# Patient Record
Sex: Male | Born: 1950
Health system: Southern US, Community
[De-identification: ages and names within clinical notes are randomized; demographics above are authoritative.]

## PROBLEM LIST (undated history)

## (undated) ENCOUNTER — Emergency Department (HOSPITAL_COMMUNITY): Payer: Medicare HMO

## (undated) DIAGNOSIS — Z8719 Personal history of other diseases of the digestive system: Secondary | ICD-10-CM

## (undated) DIAGNOSIS — N529 Male erectile dysfunction, unspecified: Secondary | ICD-10-CM

## (undated) DIAGNOSIS — J3801 Paralysis of vocal cords and larynx, unilateral: Secondary | ICD-10-CM

## (undated) DIAGNOSIS — J9311 Primary spontaneous pneumothorax: Secondary | ICD-10-CM

## (undated) DIAGNOSIS — N400 Enlarged prostate without lower urinary tract symptoms: Secondary | ICD-10-CM

## (undated) DIAGNOSIS — U071 COVID-19: Secondary | ICD-10-CM

## (undated) DIAGNOSIS — D631 Anemia in chronic kidney disease: Secondary | ICD-10-CM

## (undated) DIAGNOSIS — R12 Heartburn: Secondary | ICD-10-CM

## (undated) DIAGNOSIS — K219 Gastro-esophageal reflux disease without esophagitis: Secondary | ICD-10-CM

## (undated) DIAGNOSIS — K449 Diaphragmatic hernia without obstruction or gangrene: Secondary | ICD-10-CM

## (undated) DIAGNOSIS — N189 Chronic kidney disease, unspecified: Secondary | ICD-10-CM

## (undated) DIAGNOSIS — M21372 Foot drop, left foot: Secondary | ICD-10-CM

## (undated) HISTORY — DX: Heartburn: R12

## (undated) HISTORY — DX: Primary spontaneous pneumothorax: J93.11

## (undated) HISTORY — PX: OTHER SURGICAL HISTORY: SHX169

## (undated) HISTORY — PX: TONSILLECTOMY: SUR1361

---
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
## 1978-10-23 HISTORY — PX: KNEE SURGERY: SHX244

## 1979-06-24 HISTORY — PX: KNEE SURGERY: SHX244

## 2007-10-24 HISTORY — PX: COLONOSCOPY: SHX174

## 2008-02-03 ENCOUNTER — Ambulatory Visit: Payer: Self-pay | Admitting: Gastroenterology

## 2008-02-03 ENCOUNTER — Ambulatory Visit (HOSPITAL_COMMUNITY): Admission: RE | Admit: 2008-02-03 | Discharge: 2008-02-03 | Payer: Self-pay | Admitting: Gastroenterology

## 2010-10-23 HISTORY — PX: ACHILLES TENDON SURGERY: SHX542

## 2011-02-21 ENCOUNTER — Ambulatory Visit (INDEPENDENT_AMBULATORY_CARE_PROVIDER_SITE_OTHER): Payer: BC Managed Care – PPO | Admitting: Orthopedic Surgery

## 2011-02-21 ENCOUNTER — Encounter: Payer: Self-pay | Admitting: Orthopedic Surgery

## 2011-02-21 ENCOUNTER — Ambulatory Visit: Payer: Self-pay | Admitting: Orthopedic Surgery

## 2011-02-21 VITALS — HR 76 | Ht 70.5 in | Wt 208.0 lb

## 2011-02-21 DIAGNOSIS — S93499A Sprain of other ligament of unspecified ankle, initial encounter: Secondary | ICD-10-CM

## 2011-02-21 DIAGNOSIS — S86011A Strain of right Achilles tendon, initial encounter: Secondary | ICD-10-CM

## 2011-02-21 MED ORDER — HYDROCODONE-ACETAMINOPHEN 7.5-325 MG PO TABS: 1.0000 | ORAL_TABLET | Freq: Four times a day (QID) | ORAL | Status: AC | PRN

## 2011-02-21 NOTE — Patient Instructions (Signed)
DOS 02/24/11  Preop is tomorrow 02/22/11 at [facility] short stay center at 1:00pm, take packet with you  Do not take any aspirin or other blood thinners until after surgery.  Post op 1 in our office on 02/27/11

## 2011-02-21 NOTE — Progress Notes (Signed)
Her RIGHT ankle pain.  60 years old. Complains of pain behind his RIGHT ankle after stepping feeling a pop feeling a pop and feel a defect in his RIGHT Achilles. Complains of sharp 89 pain, which has been constant for him associated with bruising and swelling.  We discussed her treatment options of cast versus surgery. Informed consent is obtained for surgical treatment.  All systems were reviewed were negative, except for some mild heartburn.  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 Doesn't smoke.  General: The patient is normally developed, with normal grooming and hygiene. There are no gross deformities. The body habitus is normal   CDV: The pulse and perfusion of the extremities are normal   LYMPH: There is no gross lymphadenopathy in the extremities   Skin: There are no rashes, ulcers or cafe-au-lait spot   Psyche: The patient is alert, awake and oriented.  Mood is normal   Neuro:  The coordination and balance are normal.  Sensation is normal. Reflexes are 2+ and equal   Musculoskeletal   Upper extremity exam  Inspection and palpation revealed no abnormalities in the upper extremities.  Range of motion is full without contracture.  Motor exam is normal with grade 5 strength.  The joints are fully reduced without subluxation.  There is no atrophy or tremor and muscle tone is normal.  All joints are stable.   He has a positive Thompson test for Achilles rupture of the RIGHT ankle. Negative on the LEFT is a palpable defect with tenderness. The ankle is swollen. Passive range of motion of the ankle normal. Ankle strength, decreased plantar flexion, with weakness. Stability test were normal.  RIGHT Achilles tendon rupture.  Plan RIGHT Achilles tendon repair. Splint after surgery. Then convert to ankle range of motion walking boot.

## 2011-02-22 ENCOUNTER — Other Ambulatory Visit (HOSPITAL_COMMUNITY): Payer: BC Managed Care – PPO

## 2011-02-23 ENCOUNTER — Telehealth: Payer: Self-pay | Admitting: Orthopedic Surgery

## 2011-02-23 ENCOUNTER — Other Ambulatory Visit: Payer: Self-pay | Admitting: Orthopedic Surgery

## 2011-02-23 ENCOUNTER — Encounter (HOSPITAL_COMMUNITY): Payer: BC Managed Care – PPO

## 2011-02-23 LAB — SURGICAL PCR SCREEN
MRSA, PCR: NEGATIVE
Staphylococcus aureus: NEGATIVE

## 2011-02-23 LAB — HEMOGLOBIN AND HEMATOCRIT, BLOOD
HCT: 42.6 % (ref 39.0–52.0)
Hemoglobin: 14.1 g/dL (ref 13.0–17.0)

## 2011-02-23 NOTE — Telephone Encounter (Signed)
Call to [place] ph# [phone] re: out-patient surgery scheduled 02/24/11 at [facility]; CPT [phone].  Per Selinda Orion D, no prior authorization required.

## 2011-02-24 ENCOUNTER — Ambulatory Visit (HOSPITAL_COMMUNITY)
Admission: RE | Admit: 2011-02-24 | Discharge: 2011-02-24 | Disposition: A | Discharge status: Home / Self Care | Payer: BC Managed Care – PPO | Source: Ambulatory Visit | Attending: Orthopedic Surgery | Admitting: Orthopedic Surgery

## 2011-02-24 DIAGNOSIS — S96819A Strain of other specified muscles and tendons at ankle and foot level, unspecified foot, initial encounter: Secondary | ICD-10-CM

## 2011-02-24 DIAGNOSIS — S93499A Sprain of other ligament of unspecified ankle, initial encounter: Secondary | ICD-10-CM

## 2011-02-24 DIAGNOSIS — X500XXA Overexertion from strenuous movement or load, initial encounter: Secondary | ICD-10-CM | POA: Insufficient documentation

## 2011-02-24 HISTORY — PX: ACHILLES TENDON SURGERY: SHX542

## 2011-02-27 ENCOUNTER — Ambulatory Visit (INDEPENDENT_AMBULATORY_CARE_PROVIDER_SITE_OTHER): Payer: BC Managed Care – PPO | Admitting: Orthopedic Surgery

## 2011-02-27 ENCOUNTER — Ambulatory Visit: Payer: BC Managed Care – PPO | Admitting: Orthopedic Surgery

## 2011-02-27 DIAGNOSIS — S86019A Strain of unspecified Achilles tendon, initial encounter: Secondary | ICD-10-CM

## 2011-02-27 DIAGNOSIS — S96819A Strain of other specified muscles and tendons at ankle and foot level, unspecified foot, initial encounter: Secondary | ICD-10-CM

## 2011-02-27 DIAGNOSIS — S93499A Sprain of other ligament of unspecified ankle, initial encounter: Secondary | ICD-10-CM

## 2011-02-27 NOTE — Progress Notes (Signed)
On day 3 status post open RIGHT Achilles tendon repair.  Date of surgery May 4th.  Incision is clean, dry, and intact, with no drainage.  There is some ankle swelling as expected.  Neurovascular exam of the limb is normal.  A new dressing and posterior splint were applied with the foot in neutral position.  The patient will come back for staple removal in about 8 days and we will apply a range of motion brace and locked at 20, plantar flexion within neutral dorsiflexion stop

## 2011-03-03 NOTE — Op Note (Signed)
  [name], [name]              ACCOUNT NO.:  [id number]  MEDICAL RECORD NO.:  [id number]           PATIENT TYPE:  LOCATION:                                 FACILITY:  PHYSICIAN:  Carole Civil, M.D.DATE OF BIRTH:  11-Jun-1951  DATE OF PROCEDURE: DATE OF DISCHARGE:                              OPERATIVE REPORT   CHIEF COMPLAINT:  Right ankle pain.  HISTORY:  A 60 year old male complains of pain behind his right ankle after stepping and feeling a pop.  After that, he felt a defect in the right Achilles.  He complains of sharp 8/10 pain which has become constant associated with bruising and swelling.  We discussed his treatment options of cast versus surgery.  Informed consent is obtained for surgical treatment.  All systems were reviewed and were negative except for mild heartburn.  SOCIAL HISTORY:  He is married.  He has a bachelor's degree.  He is a Hotel manager.  He does not smoke.  PHYSICAL EXAMINATION:  GENERAL:  The patient is normally developed with normal grooming and hygiene. VITAL SIGNS:  Stable vital signs.  No gross deformities.  Body habitus normal. CARDIOVASCULAR:  The pulse and perfusion of the extremities are normal. LYMPH NODES:  No gross lymphadenopathy in the extremities. SKIN:  No rashes, no ulcers, no cafe au lait spots. PSYCHIATRIC:  He is awake, alert, and oriented.  His mood is normal. NEUROLOGIC:  The coordination and balance are normal.  The sensation is normal.  The reflexes are 2+ and equal. MUSCULOSKELETAL:  Upper extremity exam, inspection and palpation revealed no abnormalities in the upper extremities.  Full range of motion was recorded without contracture.  He has grade 5 motor strength. All joints are reduced without subluxation and there is no atrophy, tremor, and the muscle tone is normal.  Ankle exam; right ankle positive Thompson test for Achilles rupture, negative on the left.  There is a palpable defect with tenderness in the  right ankle at the Achilles tendon.  The ankle is swollen.  Passive range of motion is normal. Ankle strength shows decreased plantar flexion with weakness on the right, normal on the left.  Both ankles were stable.  DIAGNOSIS:  Right Achilles tendon rupture.  PLAN:  Right Achilles tendon repair.  I plan to put a split on after surgery and then when the wound is stable, convert to an ankle range of motion, nonweightbearing, walking boot.     Carole Civil, M.D.     SEH/MEDQ  D:  02/24/2011  T:  02/24/2011  Job:  [id number]  Electronically Signed by Arther Abbott M.D. on 03/03/2011 10:57:01 AM

## 2011-03-03 NOTE — Op Note (Signed)
  [name], [name]              ACCOUNT NO.:  [id number]  MEDICAL RECORD NO.:  [id number]           PATIENT TYPE:  O  LOCATION:  DAYP                          FACILITY:  [facility]  PHYSICIAN:  Carole Civil, M.D.DATE OF BIRTH:  [birth year]  DATE OF PROCEDURE:  02/24/2011 DATE OF DISCHARGE:                              OPERATIVE REPORT   PREOPERATIVE DIAGNOSIS:  Torn right Achilles tendon.  POSTOPERATIVE DIAGNOSIS:  Torn right Achilles tendon.  PROCEDURE:  Open right Achilles tendon repair.  SURGEON:  Carole Civil, MD  ASSISTANT:  Pearson Forster.  ANESTHESIA:  General.  FINDINGS:  Complete rupture of the Achilles tendon of the right ankle.  SPECIMENS:  There were no specimens.  BLOOD LOSS:  There was minimal blood loss.  COMPLICATIONS:  There were no complications.  PROCEDURE:  The patient was identified as [name] in the preop area.  He marked his right ankle as the surgical site.  I countersigned it and then chart update confirmed the surgical site.  He was taken to the operating room where he had general anesthesia.  He was placed in the prone position with appropriate padding.  His right leg was then prepped and draped in a sterile fashion.  We then took and completed the time-out procedure.  The limb was exsanguinated with a 4-inch Esmarch.  The tourniquet was elevated at 300 mmHg.  A straight incision was made just to the medial side of the midline of the Achilles.  This was taken down through the subcutaneous tissue.  The rupture was encountered, freshened and then two #5 Tycron sutures were placed in each side of the tendon in a baseball stitch fashion and then the two stitches were tied with the foot in plantarflexion.  We then took #0 Vicryl and completed a peritendinous running suture.  The Boutte test was then repeated and the continuity of the tendon was found to be satisfactory.  The foot was dorsiflexed to neutral position with  integrity maintained at the suture line.  The wound was irrigated and the incision was closed with interrupted 0 Monocryl sutures followed by staples following injection of 30 mL of Marcaine with epinephrine.  Staples were used to reapproximate the skin edges and the patient was placed in a neutral posterior splint with the tourniquet deflated.  The patient was then placed in the supine position on his bed and taken to recovery room.  The plan is for him to come back to the office on Monday to get a splint change and to order his range of motion brace at that time.  He is nonweightbearing at this time.     Carole Civil, M.D.     SEH/MEDQ  D:  02/24/2011  T:  02/25/2011  Job:  [id number]  Electronically Signed by Arther Abbott M.D. on 03/03/2011 10:57:06 AM

## 2011-03-07 ENCOUNTER — Ambulatory Visit (INDEPENDENT_AMBULATORY_CARE_PROVIDER_SITE_OTHER): Payer: BC Managed Care – PPO | Admitting: Orthopedic Surgery

## 2011-03-07 DIAGNOSIS — S86019A Strain of unspecified Achilles tendon, initial encounter: Secondary | ICD-10-CM

## 2011-03-07 DIAGNOSIS — S93499A Sprain of other ligament of unspecified ankle, initial encounter: Secondary | ICD-10-CM

## 2011-03-07 DIAGNOSIS — S96819A Strain of other specified muscles and tendons at ankle and foot level, unspecified foot, initial encounter: Secondary | ICD-10-CM

## 2011-03-07 NOTE — Progress Notes (Signed)
Post op day 11   Achilles repair  Doing well   Incision is clean   Placed in rom brace   NWB  4 weeks

## 2011-03-07 NOTE — Op Note (Signed)
[name], [name]              ACCOUNT NO.:  [id number]   MEDICAL RECORD NO.:  [id number]          PATIENT TYPE:  AMB   LOCATION:  DAY                           FACILITY:  [facility]   PHYSICIAN:  Caro Hight, M.D.      DATE OF BIRTH:  Dec 22, 1950   DATE OF PROCEDURE:  02/03/2008  DATE OF DISCHARGE:                               OPERATIVE REPORT   PROCEDURE:  Colonoscopy.   INDICATION FOR EXAM:  [name] is a 60 year old male who presents for  average risk colon cancer screening.   FINDINGS:  Frequent sigmoid diverticula.  Small internal hemorrhoids.  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   RECOMMENDATIONS:  1. Screening colonoscopy in 10 years.  2. He should follow a high-fiber diet.  He is given a handout on high-      fiber diet, diverticulosis, and hemorrhoids.   MEDICATIONS:  1. Demerol 25 mg IV.  2. Versed 4 mg IV.   PROCEDURE TECHNIQUE:  Physical exam was performed and informed consent  was obtained from the patient, after explaining the benefits, risks, and  alternatives to the procedure.  The patient was connected to the monitor  and placed in the left lateral position.  Continuous oxygen was provided  by nasal cannula and IV medicine administered with an indwelling  cannula.  After administration of sedation and rectal exam, the  patient's rectum was intubated and scope was advanced under direct  visualization to the cecum.  The scope was removed slowly by carefully  examining color, texture, anatomy, and integrity of the mucosa on the  way out.  The patient was recovered in endoscopy and discharged home in  satisfactory condition.      Caro Hight, M.D.  Electronically Signed     SM/MEDQ  D:  02/03/2008  T:  02/04/2008  Job:  [id number]

## 2011-04-05 ENCOUNTER — Ambulatory Visit: Payer: BC Managed Care – PPO | Admitting: Orthopedic Surgery

## 2011-04-05 ENCOUNTER — Ambulatory Visit (INDEPENDENT_AMBULATORY_CARE_PROVIDER_SITE_OTHER): Payer: BC Managed Care – PPO | Admitting: Orthopedic Surgery

## 2011-04-05 DIAGNOSIS — Z9889 Other specified postprocedural states: Secondary | ICD-10-CM

## 2011-04-05 DIAGNOSIS — S93499A Sprain of other ligament of unspecified ankle, initial encounter: Secondary | ICD-10-CM

## 2011-04-05 DIAGNOSIS — S96819A Strain of other specified muscles and tendons at ankle and foot level, unspecified foot, initial encounter: Secondary | ICD-10-CM

## 2011-04-05 DIAGNOSIS — S86019A Strain of unspecified Achilles tendon, initial encounter: Secondary | ICD-10-CM | POA: Insufficient documentation

## 2011-04-05 NOTE — Patient Instructions (Signed)
Start Weight bearing in brace   Start ankle exercises 25 flexion/extension exercises 4 x a day

## 2011-04-05 NOTE — Progress Notes (Signed)
5.4.2012 DOS  Post op day 40  Achilles repair  Treatment ROM brace NWB    His incision looks very good, he now has a normal thompson test   Start WBAT in brace wean crutches

## 2011-04-17 ENCOUNTER — Telehealth: Payer: Self-pay | Admitting: Orthopedic Surgery

## 2011-04-17 NOTE — Telephone Encounter (Signed)
[name] asked for a note to return to work 04/24/11  With restrictions of driving and sitting only.  Please advise if ok. [id number] or cell [phone]

## 2011-04-17 NOTE — Telephone Encounter (Signed)
This is okay.

## 2011-04-18 ENCOUNTER — Encounter: Payer: Self-pay | Admitting: Orthopedic Surgery

## 2011-04-18 NOTE — Telephone Encounter (Signed)
Advised the patient to pick up note

## 2011-04-19 ENCOUNTER — Telehealth: Payer: Self-pay | Admitting: Orthopedic Surgery

## 2011-04-19 NOTE — Telephone Encounter (Signed)
Sarang says his work place will not let him come back to work unless it is full duty no restrictions.  Asking for a note to return 04/24/11 no restrictions. Please advise. [id number] or cell  [phone]

## 2011-04-20 ENCOUNTER — Encounter: Payer: Self-pay | Admitting: Orthopedic Surgery

## 2011-04-20 NOTE — Telephone Encounter (Signed)
Approved.  

## 2011-04-20 NOTE — Telephone Encounter (Signed)
Note written, called patient to pick up

## 2011-05-09 ENCOUNTER — Ambulatory Visit (INDEPENDENT_AMBULATORY_CARE_PROVIDER_SITE_OTHER): Payer: BC Managed Care – PPO | Admitting: Orthopedic Surgery

## 2011-05-09 ENCOUNTER — Ambulatory Visit: Payer: BC Managed Care – PPO | Admitting: Orthopedic Surgery

## 2011-05-09 DIAGNOSIS — S86019A Strain of unspecified Achilles tendon, initial encounter: Secondary | ICD-10-CM

## 2011-05-09 DIAGNOSIS — Z9889 Other specified postprocedural states: Secondary | ICD-10-CM

## 2011-05-09 DIAGNOSIS — S93499A Sprain of other ligament of unspecified ankle, initial encounter: Secondary | ICD-10-CM

## 2011-05-09 DIAGNOSIS — S96819A Strain of other specified muscles and tendons at ankle and foot level, unspecified foot, initial encounter: Secondary | ICD-10-CM

## 2011-05-09 NOTE — Progress Notes (Signed)
Postoperative visit  Procedure RIGHT Achilles open repair on May 4  Doing well  Return to work July 2  Mild swelling around the ankle  He's been able to play surrounds of golf without difficulty  He has good plantarflexion strength.  Palpable tendon integrity.  Normal Thompson test.  Advised to continue return to normal activities gradually no running or jumping until 2013

## 2011-05-09 NOTE — Patient Instructions (Signed)
Gradual return to normal activity   Ok to golf

## 2011-05-31 ENCOUNTER — Telehealth: Payer: Self-pay | Admitting: Orthopedic Surgery

## 2011-05-31 NOTE — Telephone Encounter (Signed)
Patient called to request an achilles brace, states mainly for work due to walking on concrete.  States he has found one that [place] carries.  Copy to follow in fax for Dr. Aline Brochure to review.    If Dr. Aline Brochure approves, patient requests a written prescription for the brace.

## 2011-07-18 ENCOUNTER — Telehealth: Payer: Self-pay | Admitting: Orthopedic Surgery

## 2011-07-18 NOTE — Telephone Encounter (Signed)
Patient called, initially asked about a temporary handicapped sticker. He had achilles surgery 02/24/11. He went on to say that his right achilles has been hurting some, said thought normal to still be hurting.  I scheduled for appointment (offered early October dates, patient elected to wait for first available 8:30am; scheduled 08/08/11

## 2011-08-08 ENCOUNTER — Ambulatory Visit: Payer: Self-pay | Admitting: Orthopedic Surgery

## 2013-10-31 ENCOUNTER — Encounter: Payer: Self-pay | Admitting: Family Medicine

## 2013-10-31 ENCOUNTER — Ambulatory Visit (INDEPENDENT_AMBULATORY_CARE_PROVIDER_SITE_OTHER): Payer: BC Managed Care – PPO | Admitting: Family Medicine

## 2013-10-31 VITALS — BP 132/80 | Ht 70.5 in | Wt 211.5 lb

## 2013-10-31 DIAGNOSIS — K921 Melena: Secondary | ICD-10-CM

## 2013-10-31 DIAGNOSIS — Z0000 Encounter for general adult medical examination without abnormal findings: Secondary | ICD-10-CM

## 2013-10-31 DIAGNOSIS — N400 Enlarged prostate without lower urinary tract symptoms: Secondary | ICD-10-CM

## 2013-10-31 DIAGNOSIS — Z125 Encounter for screening for malignant neoplasm of prostate: Secondary | ICD-10-CM

## 2013-10-31 DIAGNOSIS — Z79899 Other long term (current) drug therapy: Secondary | ICD-10-CM

## 2013-10-31 NOTE — Progress Notes (Signed)
   Subjective:    Patient ID: [name], male    DOB: 09-15-1951, 63 y.o.   MRN: [id number]  HPI Patient is here today for his annual wellness exam. He states he has no concerns at this time. Doing very well.  The patient comes in today for a wellness visit.  A review of their health history was completed.  A review of medications was also completed. Any necessary refills were discussed. Sensible healthy diet was discussed. Importance of minimizing excessive salt and carbohydrates was also discussed. Safety was stressed including driving, activities at work and at home where applicable. Importance of regular physical activity for overall health was discussed. Preventative measures appropriate for age were discussed. Time was spent with the patient discussing any concerns they have about their well-being.   Review of Systems  Constitutional: Negative for fever, activity change and appetite change.  HENT: Negative for congestion and rhinorrhea.   Eyes: Negative for discharge.  Respiratory: Negative for cough and wheezing.   Cardiovascular: Negative for chest pain.  Gastrointestinal: Negative for vomiting, abdominal pain and blood in stool.  Genitourinary: Negative for frequency, hematuria and difficulty urinating.  Musculoskeletal: Negative for neck pain.  Skin: Negative for rash.  Allergic/Immunologic: Negative for environmental allergies and food allergies.  Neurological: Negative for weakness and headaches.  Psychiatric/Behavioral: Negative for agitation.       Objective:   Physical Exam  Constitutional: He appears well-developed and well-nourished.  HENT:  Head: Normocephalic and atraumatic.  Right Ear: External ear normal.  Left Ear: External ear normal.  Nose: Nose normal.  Mouth/Throat: Oropharynx is clear and moist.  Eyes: EOM are normal. Pupils are equal, round, and reactive to light.  Neck: Normal range of motion. Neck supple. No thyromegaly present.    Cardiovascular: Normal rate, regular rhythm and normal heart sounds.   No murmur heard. Pulmonary/Chest: Effort normal and breath sounds normal. No respiratory distress. He has no wheezes.  Abdominal: Soft. Bowel sounds are normal. He exhibits no distension and no mass. There is no tenderness.  Genitourinary: Penis normal.  Musculoskeletal: Normal range of motion. He exhibits no edema.  Lymphadenopathy:    He has no cervical adenopathy.  Neurological: He is alert. He exhibits normal muscle tone.  Skin: Skin is warm and dry. No erythema.  Psychiatric: He has a normal mood and affect. His behavior is normal. Judgment normal.          Assessment & Plan:  #1 he occasionally gets a rash on his left hand when he plays golf he wears gloves all day I recommended he takes the glove off when he is putting on the greens to let air out.  #2 wellness-safety measures dietary measures all discussed in detail. Check lab work. Follow closely. If all lab work looked good followup one year and  #3 BPH-find no hard nodules on prostate exam I would recommend that he followup with [facility] troubles checking PSA  #4 trace Hemoccult positive on rectal exam recommend colonoscopy it has been 5 years since his previous. We will set up a referral.

## 2013-11-03 ENCOUNTER — Encounter: Payer: Self-pay | Admitting: Gastroenterology

## 2013-11-28 ENCOUNTER — Ambulatory Visit: Payer: Self-pay | Admitting: Gastroenterology

## 2013-12-10 ENCOUNTER — Encounter: Payer: Self-pay | Admitting: *Deleted

## 2014-01-05 ENCOUNTER — Ambulatory Visit (INDEPENDENT_AMBULATORY_CARE_PROVIDER_SITE_OTHER): Payer: BC Managed Care – PPO | Admitting: Family Medicine

## 2014-01-05 ENCOUNTER — Encounter (HOSPITAL_COMMUNITY): Payer: Self-pay

## 2014-01-05 ENCOUNTER — Encounter: Payer: Self-pay | Admitting: Family Medicine

## 2014-01-05 ENCOUNTER — Ambulatory Visit (HOSPITAL_COMMUNITY)
Admission: RE | Admit: 2014-01-05 | Discharge: 2014-01-05 | Disposition: A | Discharge status: Home / Self Care | Payer: BC Managed Care – PPO | Source: Ambulatory Visit | Attending: Family Medicine | Admitting: Family Medicine

## 2014-01-05 VITALS — BP 140/90 | Ht 70.5 in | Wt 211.1 lb

## 2014-01-05 DIAGNOSIS — W1809XA Striking against other object with subsequent fall, initial encounter: Secondary | ICD-10-CM | POA: Insufficient documentation

## 2014-01-05 DIAGNOSIS — S060X9A Concussion with loss of consciousness of unspecified duration, initial encounter: Secondary | ICD-10-CM

## 2014-01-05 DIAGNOSIS — S1093XA Contusion of unspecified part of neck, initial encounter: Principal | ICD-10-CM

## 2014-01-05 DIAGNOSIS — S0083XA Contusion of other part of head, initial encounter: Principal | ICD-10-CM

## 2014-01-05 DIAGNOSIS — R55 Syncope and collapse: Secondary | ICD-10-CM | POA: Insufficient documentation

## 2014-01-05 DIAGNOSIS — S0003XA Contusion of scalp, initial encounter: Secondary | ICD-10-CM | POA: Insufficient documentation

## 2014-01-05 DIAGNOSIS — R937 Abnormal findings on diagnostic imaging of other parts of musculoskeletal system: Secondary | ICD-10-CM | POA: Insufficient documentation

## 2014-01-05 MED ORDER — ESOMEPRAZOLE MAGNESIUM 40 MG PO CPDR: 40.0000 mg | DELAYED_RELEASE_CAPSULE | Freq: Every day | ORAL | Status: DC

## 2014-01-05 NOTE — Progress Notes (Signed)
   Subjective:    Patient ID: [name], male    DOB: 20-Jun-1951, 63 y.o.   MRN: [id number]  Loss of Consciousness This is a new problem. The current episode started 1 to 4 weeks ago. The problem occurs intermittently. The problem has been unchanged. He lost consciousness for a period of less than 1 minute. Nothing aggravates the symptoms. Associated symptoms include light-headedness. Pertinent negatives include no abdominal pain, chest pain or fever. He has tried nothing for the symptoms. The treatment provided no relief.  2 weeks ago fell on ice, hit head LOC and laceration Pt disoriented for at least 10 minutes Pt c/o left side head pain off and on, since the fall on ice. Did not have this problem before.  This weekend,fell backwards hit fense and witnessed passing out for 15 seconds on the ground, pt doesn't recall at all. The patient states that he was laughing and his friend he started stumbling then felt dizzy then went against the fence and then down on the ground he was out for just a few seconds as best she knows by the time his friends got out of car she was sitting up in saying that he was feeling much better he went ahead and went home sat for a few minutes then went about his day. Since then she's been having some intermittent dizziness  Patient has no other concerns to discuss at this time.    Review of Systems  Constitutional: Negative for fever and fatigue.  HENT: Negative for congestion and rhinorrhea.   Respiratory: Negative for chest tightness and shortness of breath.   Cardiovascular: Positive for syncope. Negative for chest pain.  Gastrointestinal: Negative for abdominal pain.  Neurological: Positive for light-headedness.       Objective:   Physical Exam  Vitals reviewed. Constitutional: He appears well-nourished. No distress.  HENT:  Head: Normocephalic.  Right Ear: External ear normal.  Left Ear: External ear normal.  Nose: Nose normal.  Mouth/Throat: No  oropharyngeal exudate.  Neck: No tracheal deviation present.  Cardiovascular: Normal rate, regular rhythm and normal heart sounds.   No murmur heard. Pulmonary/Chest: Effort normal and breath sounds normal. No respiratory distress.  Musculoskeletal: He exhibits no edema.  Lymphadenopathy:    He has no cervical adenopathy.  Neurological: He is alert. No cranial nerve deficit. He exhibits normal muscle tone. Coordination normal.  Psychiatric: His behavior is normal.   Negative Romberg normal coordination       Assessment & Plan:  #1 head contusion with loss of consciousness-patient had been essentially concussion. Felt to be probable grade 2. Having some lingering effects with some slight dizziness. Because of the severe nature of this patient does need to have a CAT scan done to rule out subdural bleed.  #2 probable post vagal syncope probably induced by the laughing he was doing. I doubt that this was cardiac arrhythmia because of the manner in which he passed out. If he has ongoing trouble may need referral to  cardiology but I don't feel he needs to at this point  25 minutes spent with patient   CT exam did not show subdural hematoma but did show what appears to be a nondisplaced left orbital fracture no specific treatment necessary for this should gradually resolve on its own patient was told if he has ongoing trouble with dizziness or if he should have other passing out spells he is to immediately go to ER or followup.

## 2014-01-06 ENCOUNTER — Telehealth: Payer: Self-pay | Admitting: Family Medicine

## 2014-01-06 NOTE — Telephone Encounter (Signed)
Patient is still having a lot of lightheadedness and would like to know what to do.

## 2014-01-06 NOTE — Telephone Encounter (Signed)
I talked with the patient at length about the problem he is having. He relates that he is feeling lightheaded. States that he feels unsteady he denies palpitation denies feeling like he is going to pass out. No nausea vomiting no headache. He will see how he is feeling tomorrow and he will let us know. This patient is at risk of concussion syndrome related to his fall I advised him not to work tomorrow unless he's feeling much better. If he gets dramatically worse may need referral to neurology

## 2014-01-07 ENCOUNTER — Telehealth: Payer: Self-pay | Admitting: Family Medicine

## 2014-01-07 NOTE — Telephone Encounter (Signed)
Patient was advised that if his symptoms once to get worse he needs a reevaluation with [place] right away he-related understanding.

## 2014-01-07 NOTE — Telephone Encounter (Signed)
Patients workplace is requiring that he bring a release form with him to return to work tomorrow. Not sure if this means a work excuse? Wife and I were both confused about this. Patients wife has requested that on your way home today if you can just drop a release form in there mailbox today.

## 2014-01-07 NOTE — Telephone Encounter (Signed)
Patient with spoke with he felt he was doing better. I did write him a work release and dropped it off at his house

## 2014-01-26 ENCOUNTER — Encounter: Payer: Self-pay | Admitting: Family Medicine

## 2014-11-20 ENCOUNTER — Encounter: Payer: Self-pay | Admitting: Family Medicine

## 2014-11-20 ENCOUNTER — Ambulatory Visit (INDEPENDENT_AMBULATORY_CARE_PROVIDER_SITE_OTHER): Payer: BC Managed Care – PPO | Admitting: Family Medicine

## 2014-11-20 VITALS — BP 138/90 | Ht 70.5 in | Wt 219.0 lb

## 2014-11-20 DIAGNOSIS — B351 Tinea unguium: Secondary | ICD-10-CM

## 2014-11-20 DIAGNOSIS — Z79899 Other long term (current) drug therapy: Secondary | ICD-10-CM

## 2014-11-20 DIAGNOSIS — R079 Chest pain, unspecified: Secondary | ICD-10-CM

## 2014-11-20 DIAGNOSIS — Z125 Encounter for screening for malignant neoplasm of prostate: Secondary | ICD-10-CM

## 2014-11-20 DIAGNOSIS — K2100 Gastro-esophageal reflux disease with esophagitis, without bleeding: Secondary | ICD-10-CM

## 2014-11-20 DIAGNOSIS — Z13220 Encounter for screening for lipoid disorders: Secondary | ICD-10-CM

## 2014-11-20 MED ORDER — PANTOPRAZOLE SODIUM 40 MG PO TBEC: 40.0000 mg | DELAYED_RELEASE_TABLET | Freq: Every day | ORAL | Status: DC

## 2014-11-20 MED ORDER — MOMETASONE FUROATE 0.1 % EX CREA: TOPICAL_CREAM | CUTANEOUS | Status: AC

## 2014-11-20 NOTE — Progress Notes (Signed)
   Subjective:    Patient ID: [name], male    DOB: Aug 15, 1951, 64 y.o.   MRN: [id number]  HPIfingernails are cracking. Started about 1 year ago. The fingernail on his thumb has turned dysplastic he was concerned about fungus     Palm of left hand turns white when playing golf. Intermittent dry and send the base of the palm he uses lotion wonders if there something else he could use  Trouble with reflux. Was put on  nexium 40mg  one daily.pt stopped after about 2 months because it was not helping. Please see discussion below as well. Patient has been exercising up until the cold weather and states it was not causing any chest pain or shortness of breath.  Review of Systems Patient denies shortness of breath he denies anginal symptoms he relates intermittent chest pain now last 2030 minutes at a time often comes on after he eats certain foods. Denies any dysphagia. Did try Nexium but it really didn't seem to help. Does not wake him up at night.    Objective:   Physical Exam  Neck no masses lungs clear no crackles heart is regular no murmurs  abdomen soft no guarding rebound extremities no edema      Assessment & Plan:  Chest discomfort-patient denies chest pressure tightness with activity. Denies shortness of breath with activity. He walks a lot at work without trouble. He states before the wintertime came he was exercising without difficulty. His EKG overall looks good no changes compared to one taken several years ago.  Reflux he states he's had this for years describes as intermittent burning discomfort in his chest that comes and goes. He relates tried Nexium it really did not seem to help he denies dysphagia or things getting block. I believe we ought to go ahead and get this gentleman set up for EGD. Although he does not use heavy alcohol he has used some this puts him at slightly higher risk for esophageal issues possibly even cancer he has not been losing any weight though. If  EGD is negative this patient will need cardiology workup  Nail fungus versus dysplastic nail-nail culture recommended await the results if positive for fungus will treat with antifungal's if negative referral to dermatology  Intermittent dry skin on the hand recommend steroid cream when necessary  Lab work ordered

## 2014-11-21 LAB — BASIC METABOLIC PANEL
BUN: 14 mg/dL (ref 6–23)
CALCIUM: 9.8 mg/dL (ref 8.4–10.5)
CO2: 29 meq/L (ref 19–32)
CREATININE: 1.15 mg/dL (ref 0.50–1.35)
Chloride: 103 mEq/L (ref 96–112)
GLUCOSE: 82 mg/dL (ref 70–99)
Potassium: 4.5 mEq/L (ref 3.5–5.3)
SODIUM: 140 meq/L (ref 135–145)

## 2014-11-21 LAB — LIPID PANEL
CHOLESTEROL: 199 mg/dL (ref 0–200)
HDL: 44 mg/dL (ref >39)
LDL Cholesterol: 139 mg/dL — ABNORMAL HIGH (ref 0–99)
Total CHOL/HDL Ratio: 4.5 Ratio
Triglycerides: 82 mg/dL (ref <150)
VLDL: 16 mg/dL (ref 0–40)

## 2014-11-21 LAB — PSA: PSA: 3.37 ng/mL (ref <=4.00)

## 2014-11-23 ENCOUNTER — Encounter: Payer: Self-pay | Admitting: Gastroenterology

## 2014-11-25 ENCOUNTER — Telehealth: Payer: Self-pay | Admitting: Gastroenterology

## 2014-11-25 ENCOUNTER — Telehealth: Payer: Self-pay | Admitting: Family Medicine

## 2014-11-25 NOTE — Telephone Encounter (Signed)
Coming 02/04/16m11:30. Patient aware of date and time.

## 2014-11-25 NOTE — Telephone Encounter (Signed)
Routing to Dr. Fields for advice. 

## 2014-11-25 NOTE — Telephone Encounter (Signed)
If another provider feels he needs to be seen sooner, would put in an urgent with first available.

## 2014-11-25 NOTE — Telephone Encounter (Signed)
PT MAY COME TODAY AT 230P, THUR 1130 FEB 4, OR WED FEB 10 AT 1130A.

## 2014-11-25 NOTE — Telephone Encounter (Signed)
Nurses, please put on your "to do list" for Thursday to let me speak with GI- either PA or MD ( wife sees Dr Oneida Alar)

## 2014-11-25 NOTE — Telephone Encounter (Signed)
DR.SCOTT LUKING OFFICE CALLED AND DR. Geraldine Solar PATIENT SEEN BEFORE FEB 13TH DUE TO THE FACT THAT THE DOCTOR THINKS HE NEEDS AN EGD DUE TO REFLUX.  PLEASE ADVISE

## 2014-11-25 NOTE — Telephone Encounter (Signed)
Called and tried to have pt's 12/22/14 appointment moved up, they do not have any open slots unless it's urgent, in that case they ask that you call and speak to one of their docs to discuss case and need for an earlier appointment.  [facility] [phone]

## 2014-11-25 NOTE — Telephone Encounter (Signed)
Please disregard this message, Marzetta Board with [facility] called me back & [facility] will see patient tomorrow 11/26/14 and they contacted patient to arrange appointment

## 2014-11-25 NOTE — Telephone Encounter (Signed)
I called pt. He said he has some chest pain and heartburn after meals about 4 times per week, sometimes more often. He was recently started on Protonix by PCP. He has only had it about 4 days now. He has not been taken it 30 min prior to breakfast and I encouraged him to try that. Sometimes when he has the chest pain it feels like an Axe on his chest. He is scheduled for an OV with Laban Emperor, NP on 12/22/2014 and Dr. Wolfgang Phoenix would like him to be seen before then. Please advise!

## 2014-11-26 ENCOUNTER — Ambulatory Visit (INDEPENDENT_AMBULATORY_CARE_PROVIDER_SITE_OTHER): Payer: BC Managed Care – PPO | Admitting: Gastroenterology

## 2014-11-26 ENCOUNTER — Encounter: Payer: Self-pay | Admitting: Gastroenterology

## 2014-11-26 ENCOUNTER — Other Ambulatory Visit: Payer: Self-pay

## 2014-11-26 VITALS — BP 145/95 | HR 90 | Temp 97.4°F | Ht 70.5 in | Wt 217.2 lb

## 2014-11-26 DIAGNOSIS — R1013 Epigastric pain: Secondary | ICD-10-CM

## 2014-11-26 MED ORDER — DEXLANSOPRAZOLE 60 MG PO CPDR: DELAYED_RELEASE_CAPSULE | ORAL | Status: DC

## 2014-11-26 NOTE — Progress Notes (Signed)
   Subjective:    Patient ID: [name], male    DOB: April 24, 1951, 64 y.o.   MRN: [id number]  Sallee Lange, MD  HPI HEARTBURN/GERD 20 YS AGO. Has HEARTburn: 4-5 x/week.BURNING CHEST PAIN-LASTS HOURS AND MAY FEELS LIKE HE'S HAVING A HEART ATTACK.  MANAGED SX WITH OTC MEDS. TRIED FOOD ENZYMES. NO KNOWN TRIGGERS. ETOH TRIGGERS: ANYONE OF THEM.  MILD NAUSEA. TAKING BABY ASA-~1 MO AGO. DOC RECOMMENDED. STRESS AT WORK. BMs: REGULAR.   PT DENIES FEVER, CHILLS, HEMATOCHEZIA, vomiting, melena, diarrhea, SHORTNESS OF BREATH,  constipation, abdominal pain, problems swallowing, OR problems with sedation.   Past Medical History  Diagnosis Date  . Heartburn    Past Surgical History  Procedure Laterality Date  . None    . Wrist surgery Left 1970's  . Knee surgery Left 1980's  . Achilles tendon surgery Right 2012  . Colonoscopy  2009    [facility]    No Known Allergies  Current Outpatient Prescriptions  Medication Sig Dispense Refill  . mometasone (ELOCON) 0.1 % cream Apply to affected area daily prn    . pantoprazole (PROTONIX) 40 MG tablet Take 1 tablet (40 mg total) by mouth daily.     Family History  Problem Relation Age of Onset  . Colon cancer Neg Hx   . Colon polyps Neg Hx    History  Substance Use Topics  . Smoking status: Never Smoker   . Smokeless tobacco: Not on file  . Alcohol Use: Yes     Comment: moderate    Review of Systems PER HPI OTHERWISE ALL SYSTEMS ARE NEGATIVE.      Objective:   Physical Exam  Constitutional: He is oriented to person, place, and time. He appears well-developed and well-nourished. No distress.  HENT:  Head: Normocephalic and atraumatic.  Mouth/Throat: Oropharynx is clear and moist. No oropharyngeal exudate.  Eyes: Pupils are equal, round, and reactive to light. No scleral icterus.  Neck: Normal range of motion. Neck supple.  Cardiovascular: Normal rate, regular rhythm and normal heart sounds.   Pulmonary/Chest: Effort normal and breath  sounds normal. No respiratory distress.  Abdominal: Soft. Bowel sounds are normal. He exhibits no distension. There is no tenderness.  Musculoskeletal: He exhibits no edema.  Lymphadenopathy:    He has no cervical adenopathy.  Neurological: He is alert and oriented to person, place, and time.  NO FOCAL DEFICITS   Psychiatric: He has a normal mood and affect.  Vitals reviewed.         Assessment & Plan:

## 2014-11-26 NOTE — Assessment & Plan Note (Addendum)
New onset and most likely due to uncontrolled GERD and/or H pylori gastritis.   HOLD aspirin. Start Dexilant daily. LOW FAT DIET LOSE 10 LBS AVOID TRIGGERS FOR GERD.  HO GIVEN. EGD MAR 4-DISCUSSED PROCEDURE, BENEFITS, & RISKS: < 1% chance of medication reaction, PERFORATION, OR bleeding. FOLLOW UP IN 4 MOS.

## 2014-11-26 NOTE — Patient Instructions (Addendum)
YOUR CHEST DISCOMFORT IS MOST LIKELY DUE TO UNCONTROLLED REFLUX.   TO REDUCE OR ELIMINATE SYMPTOMS: 1. CONTINUE YOUR WEIGHT LOSS EFFORTS. LOSE 10 LBS AND YOU MAY NOT NEED MEDICATION.  2. AVOID TRIGGERS FOR GERD.  SEE INFO BELOW.  3.   FOLLOW A LOW FAT DIET. SEE INFO BELOW.  4. HOLD ASPIRIN.  5. STOP PROTONIX. START DEXILANT WITH BREAKFAST EVERY MORNING.    THE FIRST Friday MORNING TO SCHEDULE YOUR UPPER ENDOSCOPY IS MAR AT 0730.  FOLLOW UP IN 4 MOS.    Lifestyle and home remedies TO HELP CONTROL HEARTBURN.  You may eliminate or reduce the frequency of heartburn by making the following lifestyle changes:  . Control your weight. Being overweight is a major risk factor for heartburn and GERD. Excess pounds put pressure on your abdomen, pushing up your stomach and causing acid to back up into your esophagus.   . Eat smaller meals. 4 TO 6 MEALS A DAY. This reduces pressure on the lower esophageal sphincter, helping to prevent the valve from opening and acid from washing back into your esophagus.   Dolphus Jenny your belt. Clothes that fit tightly around your waist put pressure on your abdomen and the lower esophageal sphincter.  .  . Eliminate heartburn triggers. Everyone has specific triggers.  .   Common triggers such as fatty or fried foods, spicy food, tomato sauce, carbonated beverages, alcohol, chocolate, mint, garlic, onion, caffeine and nicotine may make heartburn worse.   Marland Kitchen Avoid stooping or bending. Tying your shoes is OK. Bending over for longer periods to weed your garden isn't, especially soon after eating.   . Don't lie down after a meal. Wait at least three to four hours after eating before going to bed, and don't lie down right after eating.   Marland Kitchen PLACE THE HEAD OF YOUR BED ON 6 INCH BLOCKS.  Alternative medicine . Several home remedies exist for treating GERD, but they provide only temporary relief. They include drinking baking soda (sodium bicarbonate) added to water or  drinking other fluids such as baking soda mixed with cream of tartar and water. . Although these liquids create temporary relief by neutralizing, washing away or buffering acids, eventually they aggravate the situation by adding gas and fluid to your stomach, increasing pressure and causing more acid reflux. Further, adding more sodium to your diet may increase your blood pressure and add stress to your heart, and excessive bicarbonate ingestion can alter the acid-base balance in your body.  Low-Fat Diet BREADS, CEREALS, PASTA, RICE, DRIED PEAS, AND BEANS These products are high in carbohydrates and most are low in fat. Therefore, they can be increased in the diet as substitutes for fatty foods. They too, however, contain calories and should not be eaten in excess. Cereals can be eaten for snacks as well as for breakfast.   FRUITS AND VEGETABLES It is good to eat fruits and vegetables. Besides being sources of fiber, both are rich in vitamins and some minerals. They help you get the daily allowances of these nutrients. Fruits and vegetables can be used for snacks and desserts.  MEATS Limit lean meat, chicken, [place], and fish to no more than 6 ounces per day. Beef, Pork, and Lamb Use lean cuts of beef, pork, and lamb. Lean cuts include:  Extra-lean ground beef.  Arm roast.  Sirloin tip.  Center-cut ham.  Round steak.  Loin chops.  Rump roast.  Tenderloin.  Trim all fat off the outside of meats before cooking. It  is not necessary to severely decrease the intake of red meat, but lean choices should be made. Lean meat is rich in protein and contains a highly absorbable form of iron. Premenopausal women, in particular, should avoid reducing lean red meat because this could increase the risk for low red blood cells (iron-deficiency anemia).  Chicken and [place] These are good sources of protein. The fat of poultry can be reduced by removing the skin and underlying fat layers before cooking.  Chicken and [place] can be substituted for lean red meat in the diet. Poultry should not be fried or covered with high-fat sauces. Fish and Shellfish Fish is a good source of protein. Shellfish contain cholesterol, but they usually are low in saturated fatty acids. The preparation of fish is important. Like chicken and [place], they should not be fried or covered with high-fat sauces. EGGS Egg whites contain no fat or cholesterol. They can be eaten often. Try 1 to 2 egg whites instead of whole eggs in recipes or use egg substitutes that do not contain yolk. MILK AND DAIRY PRODUCTS Use skim or 1% milk instead of 2% or whole milk. Decrease whole milk, natural, and processed cheeses. Use nonfat or low-fat (2%) cottage cheese or low-fat cheeses made from vegetable oils. Choose nonfat or low-fat (1 to 2%) yogurt. Experiment with evaporated skim milk in recipes that call for heavy cream. Substitute low-fat yogurt or low-fat cottage cheese for sour cream in dips and salad dressings. Have at least 2 servings of low-fat dairy products, such as 2 glasses of skim (or 1%) milk each day to help get your daily calcium intake. FATS AND OILS Reduce the total intake of fats, especially saturated fat. Butterfat, lard, and beef fats are high in saturated fat and cholesterol. These should be avoided as much as possible. Vegetable fats do not contain cholesterol, but certain vegetable fats, such as coconut oil, palm oil, and palm kernel oil are very high in saturated fats. These should be limited. These fats are often used in bakery goods, processed foods, popcorn, oils, and nondairy creamers. Vegetable shortenings and some peanut butters contain hydrogenated oils, which are also saturated fats. Read the labels on these foods and check for saturated vegetable oils. Unsaturated vegetable oils and fats do not raise blood cholesterol. However, they should be limited because they are fats and are high in calories. Total fat should  still be limited to 30% of your daily caloric intake. Desirable liquid vegetable oils are corn oil, cottonseed oil, olive oil, canola oil, safflower oil, soybean oil, and sunflower oil. Peanut oil is not as good, but small amounts are acceptable. Buy a heart-healthy tub margarine that has no partially hydrogenated oils in the ingredients. Mayonnaise and salad dressings often are made from unsaturated fats, but they should also be limited because of their high calorie and fat content. Seeds, nuts, peanut butter, olives, and avocados are high in fat, but the fat is mainly the unsaturated type. These foods should be limited mainly to avoid excess calories and fat. OTHER EATING TIPS Snacks  Most sweets should be limited as snacks. They tend to be rich in calories and fats, and their caloric content outweighs their nutritional value. Some good choices in snacks are graham crackers, melba toast, soda crackers, bagels (no egg), English muffins, fruits, and vegetables. These snacks are preferable to snack crackers, [place] fries, TORTILLA CHIPS, and POTATO chips. Popcorn should be air-popped or cooked in small amounts of liquid vegetable oil. Desserts Eat fruit, low-fat  yogurt, and fruit ices instead of pastries, cake, and cookies. Sherbet, angel food cake, gelatin dessert, frozen low-fat yogurt, or other frozen products that do not contain saturated fat (pure fruit juice bars, frozen ice pops) are also acceptable.  COOKING METHODS Choose those methods that use little or no fat. They include: Poaching.  Braising.  Steaming.  Grilling.  Baking.  Stir-frying.  Broiling.  Microwaving.  Foods can be cooked in a nonstick pan without added fat, or use a nonfat cooking spray in regular cookware. Limit fried foods and avoid frying in saturated fat. Add moisture to lean meats by using water, broth, cooking wines, and other nonfat or low-fat sauces along with the cooking methods mentioned above. Soups and stews  should be chilled after cooking. The fat that forms on top after a few hours in the refrigerator should be skimmed off. When preparing meals, avoid using excess salt. Salt can contribute to raising blood pressure in some people.  EATING AWAY FROM HOME Order entres, potatoes, and vegetables without sauces or butter. When meat exceeds the size of a deck of cards (3 to 4 ounces), the rest can be taken home for another meal. Choose vegetable or fruit salads and ask for low-calorie salad dressings to be served on the side. Use dressings sparingly. Limit high-fat toppings, such as bacon, crumbled eggs, cheese, sunflower seeds, and olives. Ask for heart-healthy tub margarine instead of butter.

## 2014-11-26 NOTE — Progress Notes (Signed)
ON RECALL LIST  °

## 2014-11-27 NOTE — Progress Notes (Signed)
cc'ed to pcp °

## 2014-12-07 ENCOUNTER — Encounter: Payer: Self-pay | Admitting: Family Medicine

## 2014-12-10 ENCOUNTER — Telehealth: Payer: Self-pay | Admitting: Gastroenterology

## 2014-12-10 NOTE — Telephone Encounter (Signed)
Pt called to see if he could get samples of Dexilant. He is waiting on a PA to be approved and is needing something to hold him until he hears back from his insurance. Please advise [phone]

## 2014-12-10 NOTE — Telephone Encounter (Signed)
Pt has [employer] [facility], per pt chart. Pharmacy said it was [employer] [place]. I have tried 3 different [employer] forms to do a PA on this pt. All of them say that he is inactive. Doris if you talk to him will you find out what is going on with his insurance? Did he change insurance?

## 2014-12-10 NOTE — Telephone Encounter (Signed)
pts wife came by the office to pick up samples. I showed her where I have been trying to do a PA and it kept showing up that he was either not eligible or he was inactive. She said she was going to call them today and try to straighten it out.

## 2014-12-10 NOTE — Telephone Encounter (Signed)
I called pt and he was given Dexilant 60 mg #10 until he gets the PA on medication. He said he is active on the [employer].  I told him they told Almyra Free that he is not active. I asked him to call is insurance and let them know there must be an error.

## 2014-12-17 ENCOUNTER — Telehealth: Payer: Self-pay | Admitting: Gastroenterology

## 2014-12-17 NOTE — Telephone Encounter (Signed)
This has been approved. I have faxed the approval to [place]. Approved from 11/17/14-12/17/15.

## 2014-12-17 NOTE — Telephone Encounter (Signed)
PATIENT WIFE ON THE PHONE INQUIRING ABOUT INSURANCE AUTH FOR HUSBANDS PRESCRIPTION.  CAME TO THE OFFICE Friday 12/11/14 WITH FAX NUMBER FOR INSURANCE AND HUSBAND HAS STILL NOT RECEIVED HIS PRESCRIPTION.  PLEASE ADVISE

## 2014-12-18 LAB — CULTURE, FUNGUS WITHOUT SMEAR

## 2014-12-21 NOTE — Progress Notes (Signed)
Dr. Nicki Reaper is speaking with this patient now.

## 2014-12-22 ENCOUNTER — Ambulatory Visit: Payer: BC Managed Care – PPO | Admitting: Gastroenterology

## 2014-12-25 ENCOUNTER — Ambulatory Visit (HOSPITAL_COMMUNITY)
Admission: RE | Admit: 2014-12-25 | Discharge: 2014-12-25 | Disposition: A | Discharge status: Home / Self Care | Payer: BC Managed Care – PPO | Source: Ambulatory Visit | Attending: Gastroenterology | Admitting: Gastroenterology

## 2014-12-25 ENCOUNTER — Encounter (HOSPITAL_COMMUNITY)
Admission: RE | Disposition: A | Discharge status: Home / Self Care | Payer: Self-pay | Source: Ambulatory Visit | Attending: Gastroenterology

## 2014-12-25 ENCOUNTER — Encounter (HOSPITAL_COMMUNITY): Payer: Self-pay | Admitting: *Deleted

## 2014-12-25 DIAGNOSIS — K219 Gastro-esophageal reflux disease without esophagitis: Secondary | ICD-10-CM | POA: Diagnosis not present

## 2014-12-25 DIAGNOSIS — Z7952 Long term (current) use of systemic steroids: Secondary | ICD-10-CM | POA: Diagnosis not present

## 2014-12-25 DIAGNOSIS — Z79899 Other long term (current) drug therapy: Secondary | ICD-10-CM | POA: Diagnosis not present

## 2014-12-25 DIAGNOSIS — K222 Esophageal obstruction: Secondary | ICD-10-CM | POA: Diagnosis not present

## 2014-12-25 DIAGNOSIS — K449 Diaphragmatic hernia without obstruction or gangrene: Secondary | ICD-10-CM | POA: Diagnosis not present

## 2014-12-25 DIAGNOSIS — R1013 Epigastric pain: Secondary | ICD-10-CM | POA: Diagnosis present

## 2014-12-25 DIAGNOSIS — K2950 Unspecified chronic gastritis without bleeding: Secondary | ICD-10-CM | POA: Insufficient documentation

## 2014-12-25 HISTORY — PX: ESOPHAGOGASTRODUODENOSCOPY: SHX5428

## 2014-12-25 SURGERY — EGD (ESOPHAGOGASTRODUODENOSCOPY)
Anesthesia: Moderate Sedation

## 2014-12-25 MED ORDER — MIDAZOLAM HCL 5 MG/5ML IJ SOLN
INTRAMUSCULAR | Status: DC | PRN
  Administered 2014-12-25: 2 mg via INTRAVENOUS
  Administered 2014-12-25: 1 mg via INTRAVENOUS
  Administered 2014-12-25: 2 mg via INTRAVENOUS

## 2014-12-25 MED ORDER — MIDAZOLAM HCL 5 MG/5ML IJ SOLN
INTRAMUSCULAR | Status: AC
  Filled 2014-12-25: qty 10

## 2014-12-25 MED ORDER — MEPERIDINE HCL 100 MG/ML IJ SOLN
INTRAMUSCULAR | Status: AC
  Filled 2014-12-25: qty 2

## 2014-12-25 MED ORDER — LIDOCAINE VISCOUS 2 % MT SOLN
OROMUCOSAL | Status: AC
  Filled 2014-12-25: qty 15

## 2014-12-25 MED ORDER — SIMETHICONE 40 MG/0.6ML PO SUSP
ORAL | Status: DC | PRN
  Administered 2014-12-25: 09:00:00

## 2014-12-25 MED ORDER — MEPERIDINE HCL 100 MG/ML IJ SOLN
INTRAMUSCULAR | Status: DC | PRN
  Administered 2014-12-25: 50 mg
  Administered 2014-12-25: 25 mg via INTRAVENOUS

## 2014-12-25 MED ORDER — LIDOCAINE VISCOUS 2 % MT SOLN
OROMUCOSAL | Status: DC | PRN
  Administered 2014-12-25: 1 via OROMUCOSAL

## 2014-12-25 NOTE — H&P (View-Only) (Signed)
   Subjective:    Patient ID: [name], male    DOB: 22-Jul-1951, 64 y.o.   MRN: [id number]  Sallee Lange, MD  HPI HEARTBURN/GERD 20 YS AGO. Has HEARTburn: 4-5 x/week.BURNING CHEST PAIN-LASTS HOURS AND MAY FEELS LIKE HE'S HAVING A HEART ATTACK.  MANAGED SX WITH OTC MEDS. TRIED FOOD ENZYMES. NO KNOWN TRIGGERS. ETOH TRIGGERS: ANYONE OF THEM.  MILD NAUSEA. TAKING BABY ASA-~1 MO AGO. DOC RECOMMENDED. STRESS AT WORK. BMs: REGULAR.   PT DENIES FEVER, CHILLS, HEMATOCHEZIA, vomiting, melena, diarrhea, SHORTNESS OF BREATH,  constipation, abdominal pain, problems swallowing, OR problems with sedation.   Past Medical History  Diagnosis Date  . Heartburn    Past Surgical History  Procedure Laterality Date  . None    . Wrist surgery Left 1970's  . Knee surgery Left 1980's  . Achilles tendon surgery Right 2012  . Colonoscopy  2009    [facility]    No Known Allergies  Current Outpatient Prescriptions  Medication Sig Dispense Refill  . mometasone (ELOCON) 0.1 % cream Apply to affected area daily prn    . pantoprazole (PROTONIX) 40 MG tablet Take 1 tablet (40 mg total) by mouth daily.     Family History  Problem Relation Age of Onset  . Colon cancer Neg Hx   . Colon polyps Neg Hx    History  Substance Use Topics  . Smoking status: Never Smoker   . Smokeless tobacco: Not on file  . Alcohol Use: Yes     Comment: moderate    Review of Systems PER HPI OTHERWISE ALL SYSTEMS ARE NEGATIVE.      Objective:   Physical Exam  Constitutional: He is oriented to person, place, and time. He appears well-developed and well-nourished. No distress.  HENT:  Head: Normocephalic and atraumatic.  Mouth/Throat: Oropharynx is clear and moist. No oropharyngeal exudate.  Eyes: Pupils are equal, round, and reactive to light. No scleral icterus.  Neck: Normal range of motion. Neck supple.  Cardiovascular: Normal rate, regular rhythm and normal heart sounds.   Pulmonary/Chest: Effort normal and breath  sounds normal. No respiratory distress.  Abdominal: Soft. Bowel sounds are normal. He exhibits no distension. There is no tenderness.  Musculoskeletal: He exhibits no edema.  Lymphadenopathy:    He has no cervical adenopathy.  Neurological: He is alert and oriented to person, place, and time.  NO FOCAL DEFICITS   Psychiatric: He has a normal mood and affect.  Vitals reviewed.         Assessment & Plan:

## 2014-12-25 NOTE — Op Note (Signed)
[name], [name]              ACCOUNT NO.:  [id number]  MEDICAL RECORD NO.:  [id number]  LOCATION:  [facility]                          FACILITY:  [facility]  PHYSICIAN:  Barney Drain, M.D.     DATE OF BIRTH:  05-Dec-1950  DATE OF PROCEDURE:  12/25/2014 DATE OF DISCHARGE:  12/25/2014                              OPERATIVE REPORT  FINAL PATH REPORT: GASTRITIS  REFERRING PROVIDER:  Scott A. Luking, MD  PROCEDURE:  Esophagogastroduodenoscopy with cold forceps biopsy of the gastric mucosa.  INDICATION FOR EXAM:  [name] is a 64 year old male, who presents with nonulcer dyspepsia.  He has a history of reflux and consumes beer.  FINDINGS: 1. Patent distal esophageal stricture located at the GE junction. 2. Small hiatal hernia. 3. Diffuse erythema and edema of the gastric mucosa involving the     entire stomach.  Cold forceps biopsies obtained to evaluate for H.     pylori. 4. Normal duodenal bulb and second portion of the duodenum.  IMPRESSION: 1. Dyspepsia due to uncontrolled gastroesophageal reflux disease  and     probable Helicobacter pylori gastritis. 2. Small hiatal hernia. 3. Patent esophageal stricture.  RECOMMENDATIONS: 1. Continue weight loss efforts.  Lose 10 pounds. 2. Follow a low-fat diet. 3. Avoid triggers for reflux. 4. Continue Dexilant daily. 5. He may restart his aspirin in 2 weeks.  MEDICATIONS: 1. Demerol 75 mg IV. 2. Versed 5 mg IV.  PROCEDURE TECHNIQUE:  Physical exam was performed.  Informed consent was obtained from the patient after explaining the benefits, risks, and alternatives to the procedure.  The patient was connected to the monitor and placed in left lateral position.  Continuous oxygen was provided by nasal cannula and IV medicine administered through an indwelling cannula.  After administration of sedation, the patient's esophagus was intubated and scope was advanced under direct visualization to the second portion of the duodenum.  The  scope was removed slowly by carefully examining the color, texture, anatomy, and integrity of the mucosa on the way out. The patient was recovered in endoscopy and discharged home in satisfactory condition.     Barney Drain, M.D.     SF/MEDQ  D:  12/25/2014  T:  12/25/2014  Job:  [id number]  cc:   Albert Reaper A. Wolfgang Phoenix, MD Fax: [phone]

## 2014-12-25 NOTE — Interval H&P Note (Signed)
History and Physical Interval Note:  12/25/2014 8:16 AM  [name]  has presented today for surgery, with the diagnosis of dyspepsia  The various methods of treatment have been discussed with the patient and family. After consideration of risks, benefits and other options for treatment, the patient has consented to  Procedure(s) with comments: ESOPHAGOGASTRODUODENOSCOPY (EGD) (N/A) - 830am as a surgical intervention .  The patient's history has been reviewed, patient examined, no change in status, stable for surgery.  I have reviewed the patient's chart and labs.  Questions were answered to the patient's satisfaction.     [employer]

## 2014-12-28 ENCOUNTER — Encounter (HOSPITAL_COMMUNITY): Payer: Self-pay | Admitting: Gastroenterology

## 2014-12-30 ENCOUNTER — Telehealth: Payer: Self-pay | Admitting: Gastroenterology

## 2014-12-30 NOTE — Discharge Instructions (Signed)
YOUR BURNING CHEST PAIN & NAUSEA ARE DUE TO gastritis BECAUSE YOU'RE USING ASPIRIN, AND REFLUX. YOU HAVE AN ESOPHAGEAL STRICTURE AND A SMALL HIATAL HERNIA. I biopsied your stomach.   CONTINUE YOUR WEIGHT LOSS EFFORTS. YOU SHOULD LOSE 20 LBS.  STRICTLY FOLLOW A LOW FAT DIET. SEE INFO BELOW.  RESTART YOUR ASPIRIN MAR 18.  CONTINUE DEXILANT DAILY.  YOUR BIOPSY RESULTS WILL BE AVAILABLE IN MY CHART AFTER MAR 7  OR MY OFFICE WILL CONTACT YOU IN 10-14 DAYS WITH YOUR RESULTS.   FOLLOW UP JUN 2016.    UPPER ENDOSCOPY AFTER CARE Read the instructions outlined below and refer to this sheet in the next week. These discharge instructions provide you with general information on caring for yourself after you leave the hospital. While your treatment has been planned according to the most current medical practices available, unavoidable complications occasionally occur. If you have any problems or questions after discharge, call DR. Flay Ghosh, [phone].  ACTIVITY  You may resume your regular activity, but move at a slower pace for the next 24 hours.   Take frequent rest periods for the next 24 hours.   Walking will help get rid of the air and reduce the bloated feeling in your belly (abdomen).   No driving for 24 hours (because of the medicine (anesthesia) used during the test).   You may shower.   Do not sign any important legal documents or operate any machinery for 24 hours (because of the anesthesia used during the test).    NUTRITION  Drink plenty of fluids.   You may resume your normal diet as instructed by your doctor.   Begin with a light meal and progress to your normal diet. Heavy or fried foods are harder to digest and may make you feel sick to your stomach (nauseated).   Avoid alcoholic beverages for 24 hours or as instructed.    MEDICATIONS  You may resume your normal medications.   WHAT YOU CAN EXPECT TODAY  Some feelings of bloating in the abdomen.   Passage of  more gas than usual.    IF YOU HAD A BIOPSY TAKEN DURING THE UPPER ENDOSCOPY:  Eat a soft diet IF YOU HAVE NAUSEA, BLOATING, ABDOMINAL PAIN, OR VOMITING.    FINDING OUT THE RESULTS OF YOUR TEST Not all test results are available during your visit. DR. Oneida Alar WILL CALL YOU WITHIN 14 DAYS OF YOUR PROCEDUE WITH YOUR RESULTS. Do not assume everything is normal if you have not heard from DR. Mariaclara Spear, CALL HER OFFICE AT [phone].  SEEK IMMEDIATE MEDICAL ATTENTION AND CALL THE OFFICE: [phone] IF:  You have more than a spotting of blood in your stool.   Your belly is swollen (abdominal distention).   You are nauseated or vomiting.   You have a temperature over 101F.   You have abdominal pain or discomfort that is severe or gets worse throughout the day.   Gastritis  Gastritis is an inflammation (the body's way of reacting to injury and/or infection) of the stomach. It is often caused by bacterial (germ) infections. It can also be caused BY ASPIRIN, BC/GOODY POWDER'S, (IBUPROFEN) MOTRIN, OR ALEVE (NAPROXEN), chemicals (including alcohol), SPICY FOODS, and medications. This illness may be associated with generalized malaise (feeling tired, not well), UPPER ABDOMINAL STOMACH cramps, and fever. One common bacterial cause of gastritis is an organism known as H. Pylori. This can be treated with antibiotics.   REFLUX   TREATMENT There are a number of medicines used to treat  reflux including: Antacids.  ZANTAC Proton-pump inhibitors: DEXILANT  HOME CARE INSTRUCTIONS Eat 2-3 hours before going to bed.  Try to reach and maintain a healthy weight. LOSE 10-20 LBS Do not eat just a few very large meals. Instead, eat 4 TO 6 smaller meals throughout the day.  Try to identify foods and beverages that make your symptoms worse, and avoid these.  Avoid tight clothing.  Do not exercise right after eating.   Low-Fat Diet BREADS, CEREALS, PASTA, RICE, DRIED PEAS, AND BEANS These products  are high in carbohydrates and most are low in fat. Therefore, they can be increased in the diet as substitutes for fatty foods. They too, however, contain calories and should not be eaten in excess. Cereals can be eaten for snacks as well as for breakfast.  Include foods that contain fiber (fruits, vegetables, whole grains, and legumes). Research shows that fiber may lower blood cholesterol levels, especially the water-soluble fiber found in fruits, vegetables, oat products, and legumes. FRUITS AND VEGETABLES It is good to eat fruits and vegetables. Besides being sources of fiber, both are rich in vitamins and some minerals. They help you get the daily allowances of these nutrients. Fruits and vegetables can be used for snacks and desserts. MEATS Limit lean meat, chicken, [place], and fish to no more than 6 ounces per day. Beef, Pork, and Lamb Use lean cuts of beef, pork, and lamb. Lean cuts include:  Extra-lean ground beef.  Arm roast.  Sirloin tip.  Center-cut ham.  Round steak.  Loin chops.  Rump roast.  Tenderloin.  Trim all fat off the outside of meats before cooking. It is not necessary to severely decrease the intake of red meat, but lean choices should be made. Lean meat is rich in protein and contains a highly absorbable form of iron. Premenopausal women, in particular, should avoid reducing lean red meat because this could increase the risk for low red blood cells (iron-deficiency anemia).  Chicken and [place] These are good sources of protein. The fat of poultry can be reduced by removing the skin and underlying fat layers before cooking. Chicken and [place] can be substituted for lean red meat in the diet. Poultry should not be fried or covered with high-fat sauces. Fish and Shellfish Fish is a good source of protein. Shellfish contain cholesterol, but they usually are low in saturated fatty acids. The preparation of fish is important. Like chicken and [place], they should not be fried  or covered with high-fat sauces. EGGS Egg whites contain no fat or cholesterol. They can be eaten often. Try 1 to 2 egg whites instead of whole eggs in recipes or use egg substitutes that do not contain yolk.  MILK AND DAIRY PRODUCTS Use skim or 1% milk instead of 2% or whole milk. Decrease whole milk, natural, and processed cheeses. Use nonfat or low-fat (2%) cottage cheese or low-fat cheeses made from vegetable oils. Choose nonfat or low-fat (1 to 2%) yogurt. Experiment with evaporated skim milk in recipes that call for heavy cream. Substitute low-fat yogurt or low-fat cottage cheese for sour cream in dips and salad dressings. Have at least 2 servings of low-fat dairy products, such as 2 glasses of skim (or 1%) milk each day to help get your daily calcium intake.  FATS AND OILS Butterfat, lard, and beef fats are high in saturated fat and cholesterol. These should be avoided.Vegetable fats do not contain cholesterol. AVOID coconut oil, palm oil, and palm kernel oil, WHICH are very high in  saturated fats. These should be limited. These fats are often used in bakery goods, processed foods, popcorn, oils, and nondairy creamers. Vegetable shortenings and some peanut butters contain hydrogenated oils, which are also saturated fats. Read the labels on these foods and check for saturated vegetable oils.  Desirable liquid vegetable oils are corn oil, cottonseed oil, olive oil, canola oil, safflower oil, soybean oil, and sunflower oil. Peanut oil is not as good, but small amounts are acceptable. Buy a heart-healthy tub margarine that has no partially hydrogenated oils in the ingredients. AVOID Mayonnaise and salad dressings often are made from unsaturated fats.  OTHER EATING TIPS Snacks  Most sweets should be limited as snacks. They tend to be rich in calories and fats, and their caloric content outweighs their nutritional value. Some good choices in snacks are graham crackers, melba toast, soda crackers,  bagels (no egg), English muffins, fruits, and vegetables. These snacks are preferable to snack crackers, [place] fries, and chips. Popcorn should be air-popped or cooked in small amounts of liquid vegetable oil.  Desserts Eat fruit, low-fat yogurt, and fruit ices instead of pastries, cake, and cookies. Sherbet, angel food cake, gelatin dessert, frozen low-fat yogurt, or other frozen products that do not contain saturated fat (pure fruit juice bars, frozen ice pops) are also acceptable.   COOKING METHODS Choose those methods that use little or no fat. They include: Poaching.  Braising.  Steaming.  Grilling.  Baking.  Stir-frying.  Broiling.  Microwaving.  Foods can be cooked in a nonstick pan without added fat, or use a nonfat cooking spray in regular cookware. Limit fried foods and avoid frying in saturated fat. Add moisture to lean meats by using water, broth, cooking wines, and other nonfat or low-fat sauces along with the cooking methods mentioned above. Soups and stews should be chilled after cooking. The fat that forms on top after a few hours in the refrigerator should be skimmed off. When preparing meals, avoid using excess salt. Salt can contribute to raising blood pressure in some people.  EATING AWAY FROM HOME Order entres, potatoes, and vegetables without sauces or butter. When meat exceeds the size of a deck of cards (3 to 4 ounces), the rest can be taken home for another meal. Choose vegetable or fruit salads and ask for low-calorie salad dressings to be served on the side. Use dressings sparingly. Limit high-fat toppings, such as bacon, crumbled eggs, cheese, sunflower seeds, and olives. Ask for heart-healthy tub margarine instead of butter.  Hiatal Hernia A hiatal hernia occurs when a part of the stomach slides above the diaphragm. The diaphragm is the thin muscle separating the belly (abdomen) from the chest. A hiatal hernia can be something you are born with or develop over  time. Hiatal hernias may allow stomach acid to flow back into your esophagus, the tube which carries food from your mouth to your stomach. If this acid causes problems it is called GERD (gastro-esophageal reflux disease).   SYMPTOMS Common symptoms of GERD are heartburn (burning in your chest). This is worse when lying down or bending over. It may also cause belching and indigestion. Some of the things which make GERD worse are:  Increased weight pushes on stomach making acid rise more easily.   Smoking markedly increases acid production.   Alcohol decreases lower esophageal sphincter pressure (valve between stomach and esophagus), allowing acid from stomach into esophagus.   Late evening meals and going to bed with a full stomach increases pressure.   Anything  that causes an increase in acid production.    HOME CARE INSTRUCTIONS  Try to achieve and maintain an ideal body weight.   Avoid drinking alcoholic beverages.   DO NOT smokE.   Do not wear tight clothing around your chest or stomach.   Eat smaller meals and eat more frequently. This keeps your stomach from getting too full. Eat slowly.   Do not lie down for 2 or 3 hours after eating. Do not eat or drink anything 1 to 2 hours before going to bed.   Avoid caffeine beverages (colas, coffee, cocoa, tea), fatty foods, citrus fruits and all other foods and drinks that contain acid and that seem to increase the problems.   Avoid bending over, especially after eating OR STRAINING. Anything that increases the pressure in your belly increases the amount of acid that may be pushed up into your esophagus.

## 2014-12-30 NOTE — Telephone Encounter (Signed)
Please call pt. His stomach Bx shows gastritis.    CONTINUE YOUR WEIGHT LOSS EFFORTS. LOSE 10 LBS.  STRICTLY FOLLOW A LOW FAT DIET.   RESTART YOUR ASPIRIN MAR 18.  CONTINUE DEXILANT DAILY.  FOLLOW UP JUN 2016 E30 DYSPEPSIA.

## 2014-12-31 NOTE — Telephone Encounter (Signed)
Reminder in epic °

## 2014-12-31 NOTE — Telephone Encounter (Signed)
LMOM to call.

## 2015-01-04 NOTE — Telephone Encounter (Signed)
PT IS AWARE

## 2015-03-04 ENCOUNTER — Encounter: Payer: Self-pay | Admitting: Gastroenterology

## 2015-03-04 MED ORDER — ESOMEPRAZOLE MAGNESIUM 40 MG PO CPDR: DELAYED_RELEASE_CAPSULE | ORAL | Status: DC

## 2015-11-03 ENCOUNTER — Telehealth: Payer: Self-pay | Admitting: Family Medicine

## 2015-11-03 NOTE — Telephone Encounter (Signed)
Pt would like a script for the shingles shot

## 2015-11-03 NOTE — Telephone Encounter (Signed)
plz give him one!

## 2015-11-04 NOTE — Telephone Encounter (Signed)
Called patient and informed him per Dr.Scott Luking- Prescription for shingles vaccine ready for pick up. Patient verbalized understanding.

## 2016-01-11 ENCOUNTER — Encounter: Payer: Self-pay | Admitting: Family Medicine

## 2016-01-11 ENCOUNTER — Ambulatory Visit (INDEPENDENT_AMBULATORY_CARE_PROVIDER_SITE_OTHER): Payer: BC Managed Care – PPO | Admitting: Family Medicine

## 2016-01-11 VITALS — BP 130/86 | Ht 70.5 in | Wt 211.6 lb

## 2016-01-11 DIAGNOSIS — L603 Nail dystrophy: Secondary | ICD-10-CM | POA: Diagnosis not present

## 2016-01-11 DIAGNOSIS — Z125 Encounter for screening for malignant neoplasm of prostate: Secondary | ICD-10-CM | POA: Diagnosis not present

## 2016-01-11 DIAGNOSIS — E785 Hyperlipidemia, unspecified: Secondary | ICD-10-CM | POA: Diagnosis not present

## 2016-01-11 DIAGNOSIS — Z131 Encounter for screening for diabetes mellitus: Secondary | ICD-10-CM

## 2016-01-11 NOTE — Progress Notes (Signed)
   Subjective:    Patient ID: [name], male    DOB: July 07, 1951, 65 y.o.   MRN: [id number]  HPI  Patient arrives with c/o nails turing whit and falling off on his left hand Patient denies any chest tightness pressure pain shortness of breath. Does do walking tries to eat healthy. Patient does have history of slight elevation in cholesterol. PMH benign Review of Systems See above. Relates the nail problem is bothering him but no pain or discomfort    Objective:   Physical Exam  Patient does have dystrophic nails on 2 fingers on the left hand. Lungs are clear hearts regular pulse normal Patient does not want to do prostate exam Patient states she's up-to-date on colonoscopy      Assessment & Plan:  Dystrophic nails-referral to Dr. Rozann Lesches dermatology for further evaluation may need to consider psoriatic nails culture that was taken 1 year ago was negative for fungus  Hyperlipidemia check lipid profile Screening glucose and PSA

## 2016-02-12 LAB — PSA: PROSTATE SPECIFIC AG, SERUM: 2.8 ng/mL (ref 0.0–4.0)

## 2016-02-12 LAB — LIPID PANEL
CHOLESTEROL TOTAL: 205 mg/dL — AB (ref 100–199)
Chol/HDL Ratio: 4.5 ratio units (ref 0.0–5.0)
HDL: 46 mg/dL (ref >39)
LDL Calculated: 145 mg/dL — ABNORMAL HIGH (ref 0–99)
TRIGLYCERIDES: 72 mg/dL (ref 0–149)
VLDL CHOLESTEROL CAL: 14 mg/dL (ref 5–40)

## 2016-02-12 LAB — GLUCOSE, RANDOM: Glucose: 84 mg/dL (ref 65–99)

## 2016-02-29 NOTE — Addendum Note (Signed)
Addended by: Dairl Ponder on: 02/29/2016 03:07 PM   Modules accepted: Orders

## 2016-08-30 ENCOUNTER — Ambulatory Visit (INDEPENDENT_AMBULATORY_CARE_PROVIDER_SITE_OTHER): Payer: BC Managed Care – PPO | Admitting: Family Medicine

## 2016-08-30 ENCOUNTER — Encounter: Payer: Self-pay | Admitting: Family Medicine

## 2016-08-30 VITALS — BP 130/90 | Ht 70.5 in | Wt 210.2 lb

## 2016-08-30 DIAGNOSIS — R079 Chest pain, unspecified: Secondary | ICD-10-CM

## 2016-08-30 NOTE — Progress Notes (Signed)
   Subjective:    Patient ID: [name], male    DOB: Mar 07, 1951, 65 y.o.   MRN: [id number]  Chest Pain   This is a new problem. The current episode started 1 to 4 weeks ago. The pain radiates to the epigastrium. Associated symptoms include shortness of breath.  Extremely nice patient he relates that he's had some intermittent spells where he is felt a funny sensation in the left side of his chest that he describes as a emptiness in his chest sometimes tightness in the discomfort sometimes associated with shortness of breath. Patient was already on Nexium when this was occurring. For a while he thought Nexium might be causing it so he stopped taking the Nexium but it did not go away he experienced these symptoms mainly at rest they last for a few seconds at a time then they go away sometimes her last for a few minutes. No sweats with him. He does not do any exercise he works a lot. Occasionally here be walking in state he does not experience the discomfort with it. Patient does have some risk factors with age, sex, cholesterol Patient states no other concerns this visit.    Review of Systems  Respiratory: Positive for shortness of breath.   Cardiovascular: Positive for chest pain.  Denies vomiting diarrhea does have a history of reflux was on Nexium when this started     Objective:   Physical Exam Lungs clear hearts regular pulse normal BP good abdomen soft no guarding rebound or tenderness   EKG overall looks good no acute changes    Assessment & Plan:  Chest discomfort-it is concerning what is going on with this patient. Although it is not classic symptoms for angina it is not classic symptoms of reflux either. Therefore I do feel with his risk factors cardiac disease needs to be ruled out with cardiology consultation as well as a probable stress test. Patient was instructed to start taking 81 mg aspirin until he is seen Patient was instructed if he starts having severe chest pressure  pain that does not go away he needs immediately call 911 Has history of reflux to continue taking PPI.  Because of chest discomfort chest x-ray ordered.

## 2016-08-31 LAB — GLUCOSE, RANDOM: Glucose: 85 mg/dL (ref 65–99)

## 2016-08-31 LAB — LIPID PANEL
CHOLESTEROL TOTAL: 225 mg/dL — AB (ref 100–199)
Chol/HDL Ratio: 5.1 ratio units — ABNORMAL HIGH (ref 0.0–5.0)
HDL: 44 mg/dL (ref >39)
LDL Calculated: 157 mg/dL — ABNORMAL HIGH (ref 0–99)
Triglycerides: 119 mg/dL (ref 0–149)
VLDL Cholesterol Cal: 24 mg/dL (ref 5–40)

## 2016-09-04 ENCOUNTER — Ambulatory Visit (INDEPENDENT_AMBULATORY_CARE_PROVIDER_SITE_OTHER): Payer: BC Managed Care – PPO | Admitting: Internal Medicine

## 2016-09-04 ENCOUNTER — Encounter: Payer: Self-pay | Admitting: Internal Medicine

## 2016-09-04 VITALS — BP 136/86 | HR 80 | Ht 70.0 in | Wt 208.0 lb

## 2016-09-04 DIAGNOSIS — R072 Precordial pain: Secondary | ICD-10-CM

## 2016-09-04 NOTE — Progress Notes (Signed)
Cardiology Office Note   Date:  09/04/2016   ID:  [name], DOB [birth year], MRN [id number]  PCP:  Sallee Lange, MD  Cardiologist:   Dorris Carnes, MD    Referred by Lance Sell for CP   History of Present Illness: [name] is a 65 y.o. male with a history of CP  Pain started 1 to 4 wks ago  Epiggastric  Associated with SOB  Intermittent L chest (emptiness, tightness, mild SOB)  Stopped nexium  Didn't help  Occurred mainly at rest   Occurs every day  Some worse  Some  WOrks 5 or 6 days  [employer] surgeon  Doesn't notice at work    Suffers from GERD for 20 yr ago    It was a sharp feeling   He says what he is having now is more of a dull feeling   But he says that it is getting better    Had a physical 10 months ago  Blood work        Outpatient Medications Prior to Visit  Medication Sig Dispense Refill  . dexlansoprazole (DEXILANT) 60 MG capsule 1 PO EVERY MORNING (Patient not taking: Reported on 08/30/2016) 30 capsule 11  . esomeprazole (NEXIUM) 40 MG capsule 1 PO EVERY MORNING (Patient not taking: Reported on 08/30/2016) 31 capsule 11   No facility-administered medications prior to visit.      Allergies:   Patient has no known allergies.   Past Medical History:  Diagnosis Date  . Heartburn     Past Surgical History:  Procedure Laterality Date  . ACHILLES TENDON SURGERY Right 2012  . COLONOSCOPY  2009   [facility]  . ESOPHAGOGASTRODUODENOSCOPY N/A 12/25/2014   Procedure: ESOPHAGOGASTRODUODENOSCOPY (EGD);  Surgeon: Danie Binder, MD;  Location: [facility];  Service: Endoscopy;  Laterality: N/A;  830am  . KNEE SURGERY Left 1980's  . none    . WRIST SURGERY Left 1970's     Social History:  The patient  reports that he has never smoked. He has never used smokeless tobacco. He reports that he drinks alcohol. He reports that he does not use drugs.   Family History:  The patient's family history is not on file.    ROS:  Please see the history of present  illness. All other systems are reviewed and  Negative to the above problem except as noted.    PHYSICAL EXAM: VS:  BP 136/86 (BP Location: Right Arm)   Pulse 80   Ht 5\' 10"  (1.778 m)   Wt 208 lb (94.3 kg)   SpO2 96%   BMI 29.84 kg/m   GEN: Well nourished, well developed, in no acute distress  HEENT: normal  Neck: no JVD, carotid bruits, or masses Cardiac: RRR; no murmurs, rubs, or gallops,no edema  Respiratory:  clear to auscultation bilaterally, normal work of breathing GI: soft, nontender, nondistended, + BS  No hepatomegaly  MS: no deformity Moving all extremities   Skin: warm and dry, no rash Neuro:  Strength and sensation are intact Psych: euthymic mood, full affect   EKG:  EKG is not ordered today.   Lipid Panel    Component Value Date/Time   CHOL 225 (H) 08/30/2016 1134   TRIG 119 08/30/2016 1134   HDL 44 08/30/2016 1134   CHOLHDL 5.1 (H) 08/30/2016 1134   CHOLHDL 4.5 11/20/2014 1224   VLDL 16 11/20/2014 1224   LDLCALC 157 (H) 08/30/2016 1134      Wt Readings from Last  3 Encounters:  09/04/16 208 lb (94.3 kg)  08/30/16 210 lb 4 oz (95.4 kg)  01/11/16 211 lb 9.6 oz (96 kg)      ASSESSMENT AND PLAN:  1  CP  Concerning  Not compleltely typcial for angina  Does have history of GERD  He says that is a little different   But, he says overall spells getting better  He denies having spells with exertion. LDL is high at 157  I would recomm:  GXT to eval tolerance and look for any inducible ischemia If positive would recomm further ischemic eval If neg would still recomm calcium score CT to look for plaquing, define risk, define need/goal for medical Rx  2.  HL  Discussed diet  He admits to eating a lot of pizza with everything on it Working with life coach.  Again, may get CT scan   Discussed goals  F/U based on test results     Current medicines are reviewed at length with the patient today.  The patient does not have concerns regarding  medicines.  Signed, Dorris Carnes, MD  09/04/2016 9:14 AM    [address], [place], [place]  [address] Phone: [phone]; Fax: [phone]

## 2016-09-04 NOTE — Patient Instructions (Signed)
Your physician recommends that you schedule a follow-up appointment in: to be determined after test     Your physician has requested that you have an exercise tolerance test. For further information please visit [email or web address]. Please also follow instruction sheet, as given.       Thank you for choosing [facility] !

## 2016-09-06 ENCOUNTER — Encounter: Payer: Self-pay | Admitting: Family Medicine

## 2016-09-13 ENCOUNTER — Ambulatory Visit (HOSPITAL_COMMUNITY)
Admission: RE | Admit: 2016-09-13 | Discharge: 2016-09-13 | Disposition: A | Discharge status: Home / Self Care | Payer: BC Managed Care – PPO | Source: Ambulatory Visit | Attending: Internal Medicine | Admitting: Internal Medicine

## 2016-09-13 DIAGNOSIS — R072 Precordial pain: Secondary | ICD-10-CM | POA: Diagnosis not present

## 2016-09-13 LAB — EXERCISE TOLERANCE TEST
CSEPEW: 13.2 METS
CSEPHR: 100 %
CSEPPHR: 157 {beats}/min
Exercise duration (min): 9 min
Exercise duration (sec): 59 s
MPHR: 157 {beats}/min
RPE: 12
Rest HR: 81 {beats}/min

## 2017-11-08 ENCOUNTER — Encounter: Payer: Self-pay | Admitting: Family Medicine

## 2017-11-08 ENCOUNTER — Ambulatory Visit (INDEPENDENT_AMBULATORY_CARE_PROVIDER_SITE_OTHER): Payer: Medicare HMO | Admitting: Family Medicine

## 2017-11-08 VITALS — Ht 70.5 in | Wt 212.0 lb

## 2017-11-08 DIAGNOSIS — I491 Atrial premature depolarization: Secondary | ICD-10-CM | POA: Diagnosis not present

## 2017-11-08 DIAGNOSIS — R972 Elevated prostate specific antigen [PSA]: Secondary | ICD-10-CM

## 2017-11-08 DIAGNOSIS — Z125 Encounter for screening for malignant neoplasm of prostate: Secondary | ICD-10-CM | POA: Diagnosis not present

## 2017-11-08 DIAGNOSIS — R002 Palpitations: Secondary | ICD-10-CM

## 2017-11-08 DIAGNOSIS — Z13220 Encounter for screening for lipoid disorders: Secondary | ICD-10-CM

## 2017-11-08 DIAGNOSIS — R5383 Other fatigue: Secondary | ICD-10-CM

## 2017-11-08 NOTE — Progress Notes (Signed)
   Subjective:    Patient ID: [name], male    DOB: [birth year], 67 y.o.   MRN: [id number]  Palpitations   This is a new problem. The current episode started more than 1 month ago. The problem occurs constantly. The problem has been unchanged. On average, each episode lasts 2 seconds. The symptoms are aggravated by caffeine and stress. Associated symptoms include an irregular heartbeat. Pertinent negatives include no anxiety, chest pain, coughing, diaphoresis or fever. He has tried nothing for the symptoms. The treatment provided no relief.   He is never had these symptoms before.  Does not have a history of cardiac disease.  C saw a cardiologist back in 2017 for unrelated issue Denies any rapid heartbeat denies any chest tightness pressure pain no sweats chills vomiting.  Does use caffeine some does not get enough exercise is under a moderate amount of stress   Review of Systems  Constitutional: Negative for diaphoresis and fever.  Respiratory: Negative for cough.   Cardiovascular: Positive for palpitations. Negative for chest pain.  Psychiatric/Behavioral: The patient is not nervous/anxious.        Objective:   Physical Exam Neck no masses lungs clear no crackles heart regular pulse normal extremities no edema blood pressure good EKG reviewed shows premature atrial contraction with compensatory pause no other findings.       Assessment & Plan:  Premature atrial contraction-infrequent but enough to get the patient's attention There is no rapid ventricular response there is no sign of any ischemia issues I do not recommend any advanced testing currently the patient agrees to this.  In addition to this the patient will cut back on caffeine try to do some walking he will monitor how this is going he will give [place] feedback on how it is doing in addition to this if it becomes worse he will follow-up sooner  He will do lab work including thyroid plus also do a wellness visit within the  next 60 days

## 2017-11-09 ENCOUNTER — Encounter: Payer: Self-pay | Admitting: Family Medicine

## 2017-11-09 LAB — LIPID PANEL
CHOL/HDL RATIO: 4.6 ratio (ref 0.0–5.0)
Cholesterol, Total: 211 mg/dL — ABNORMAL HIGH (ref 100–199)
HDL: 46 mg/dL (ref >39)
LDL Calculated: 149 mg/dL — ABNORMAL HIGH (ref 0–99)
Triglycerides: 82 mg/dL (ref 0–149)
VLDL Cholesterol Cal: 16 mg/dL (ref 5–40)

## 2017-11-09 LAB — CBC WITH DIFFERENTIAL/PLATELET
BASOS ABS: 0 10*3/uL (ref 0.0–0.2)
Basos: 0 %
EOS (ABSOLUTE): 0.1 10*3/uL (ref 0.0–0.4)
EOS: 3 %
Hematocrit: 46.6 % (ref 37.5–51.0)
Hemoglobin: 15.8 g/dL (ref 13.0–17.7)
IMMATURE GRANULOCYTES: 0 %
Immature Grans (Abs): 0 10*3/uL (ref 0.0–0.1)
Lymphocytes Absolute: 1.1 10*3/uL (ref 0.7–3.1)
Lymphs: 35 %
MCH: 31.7 pg (ref 26.6–33.0)
MCHC: 33.9 g/dL (ref 31.5–35.7)
MCV: 93 fL (ref 79–97)
MONOS ABS: 0.3 10*3/uL (ref 0.1–0.9)
Monocytes: 8 %
Neutrophils Absolute: 1.8 10*3/uL (ref 1.4–7.0)
Neutrophils: 54 %
PLATELETS: 202 10*3/uL (ref 150–379)
RBC: 4.99 x10E6/uL (ref 4.14–5.80)
RDW: 14.2 % (ref 12.3–15.4)
WBC: 3.3 10*3/uL — AB (ref 3.4–10.8)

## 2017-11-09 LAB — HEPATIC FUNCTION PANEL
ALBUMIN: 4.6 g/dL (ref 3.6–4.8)
ALT: 18 IU/L (ref 0–44)
AST: 17 IU/L (ref 0–40)
Alkaline Phosphatase: 45 IU/L (ref 39–117)
BILIRUBIN TOTAL: 0.4 mg/dL (ref 0.0–1.2)
Bilirubin, Direct: 0.12 mg/dL (ref 0.00–0.40)
Total Protein: 7.2 g/dL (ref 6.0–8.5)

## 2017-11-09 LAB — BASIC METABOLIC PANEL
BUN/Creatinine Ratio: 6 — ABNORMAL LOW (ref 10–24)
BUN: 8 mg/dL (ref 8–27)
CO2: 25 mmol/L (ref 20–29)
Calcium: 9.5 mg/dL (ref 8.6–10.2)
Chloride: 107 mmol/L — ABNORMAL HIGH (ref 96–106)
Creatinine, Ser: 1.28 mg/dL — ABNORMAL HIGH (ref 0.76–1.27)
GFR, EST AFRICAN AMERICAN: 67 mL/min/{1.73_m2} (ref >59)
GFR, EST NON AFRICAN AMERICAN: 58 mL/min/{1.73_m2} — AB (ref >59)
Glucose: 88 mg/dL (ref 65–99)
Potassium: 4.8 mmol/L (ref 3.5–5.2)
Sodium: 143 mmol/L (ref 134–144)

## 2017-11-09 LAB — PSA: PROSTATE SPECIFIC AG, SERUM: 4.7 ng/mL — AB (ref 0.0–4.0)

## 2017-11-09 LAB — TSH: TSH: 1.78 u[IU]/mL (ref 0.450–4.500)

## 2017-11-16 DIAGNOSIS — R972 Elevated prostate specific antigen [PSA]: Secondary | ICD-10-CM | POA: Diagnosis not present

## 2017-11-16 NOTE — Addendum Note (Signed)
Addended by: Dairl Ponder on: 11/16/2017 11:37 AM   Modules accepted: Orders

## 2017-11-16 NOTE — Addendum Note (Signed)
Addended by: Dairl Ponder on: 11/16/2017 11:39 AM   Modules accepted: Orders

## 2017-11-17 LAB — PSA, TOTAL AND FREE
PROSTATE SPECIFIC AG, SERUM: 4.1 ng/mL — AB (ref 0.0–4.0)
PSA FREE PCT: 13.9 %
PSA, Free: 0.57 ng/mL

## 2017-11-21 ENCOUNTER — Encounter: Payer: Self-pay | Admitting: Family Medicine

## 2017-11-26 NOTE — Addendum Note (Signed)
Addended by: Karle Barr on: 11/26/2017 08:54 AM   Modules accepted: Orders

## 2017-12-10 ENCOUNTER — Telehealth: Payer: Self-pay | Admitting: Family Medicine

## 2017-12-10 NOTE — Telephone Encounter (Signed)
Patient recently diagnosed with elevated PSA referred to [facility] urology patient specifically would like to have Dr.Lester Alinda Money to consult on him.  We will try to get this done.

## 2017-12-14 NOTE — Telephone Encounter (Signed)
Please call urology and asked them to shift his appointment to the other specialist even if it means having to wait a while longer to be seen.  Please notify patient regarding all of this thank you

## 2017-12-17 NOTE — Telephone Encounter (Signed)
Appointment changed & pt notified

## 2017-12-28 ENCOUNTER — Encounter: Payer: Medicare HMO | Admitting: Family Medicine

## 2018-01-15 ENCOUNTER — Encounter: Payer: Self-pay | Admitting: Gastroenterology

## 2018-02-20 DIAGNOSIS — R972 Elevated prostate specific antigen [PSA]: Secondary | ICD-10-CM | POA: Diagnosis not present

## 2018-03-21 DIAGNOSIS — R972 Elevated prostate specific antigen [PSA]: Secondary | ICD-10-CM | POA: Diagnosis not present

## 2018-10-04 DIAGNOSIS — R972 Elevated prostate specific antigen [PSA]: Secondary | ICD-10-CM | POA: Diagnosis not present

## 2018-10-11 DIAGNOSIS — R972 Elevated prostate specific antigen [PSA]: Secondary | ICD-10-CM | POA: Diagnosis not present

## 2018-10-11 DIAGNOSIS — F524 Premature ejaculation: Secondary | ICD-10-CM | POA: Diagnosis not present

## 2018-10-23 DIAGNOSIS — I639 Cerebral infarction, unspecified: Secondary | ICD-10-CM

## 2018-10-23 HISTORY — DX: Cerebral infarction, unspecified: I63.9

## 2019-04-28 DIAGNOSIS — R972 Elevated prostate specific antigen [PSA]: Secondary | ICD-10-CM | POA: Diagnosis not present

## 2019-05-02 DIAGNOSIS — R972 Elevated prostate specific antigen [PSA]: Secondary | ICD-10-CM | POA: Diagnosis not present

## 2019-07-21 ENCOUNTER — Ambulatory Visit (INDEPENDENT_AMBULATORY_CARE_PROVIDER_SITE_OTHER): Payer: Medicare HMO | Admitting: Family Medicine

## 2019-07-21 ENCOUNTER — Encounter: Payer: Self-pay | Admitting: Family Medicine

## 2019-07-21 ENCOUNTER — Other Ambulatory Visit: Payer: Self-pay

## 2019-07-21 DIAGNOSIS — U071 COVID-19: Secondary | ICD-10-CM | POA: Diagnosis not present

## 2019-07-21 DIAGNOSIS — Z20822 Contact with and (suspected) exposure to covid-19: Secondary | ICD-10-CM

## 2019-07-21 DIAGNOSIS — R6889 Other general symptoms and signs: Secondary | ICD-10-CM

## 2019-07-21 MED ORDER — ONDANSETRON 4 MG PO TBDP: 4.0000 mg | ORAL_TABLET | Freq: Three times a day (TID) | ORAL | 0 refills | Status: DC | PRN

## 2019-07-21 NOTE — Progress Notes (Signed)
   Subjective:  Audio only  Patient ID: [name], male    DOB: Nov 18, 1950, 68 y.o.   MRN: [id number]  HPI  Patient calls for headache, body aches, nausea and diarrhea for several days. Symptoms started on Tuesday  Virtual Visit via Video Note  I connected with [name] on 07/21/19 at 11:30 AM EDT by a video enabled telemedicine application and verified that I am speaking with the correct person using two identifiers.  Location: Patient: home Provider:office   I discussed the limitations of evaluation and management by telemedicine and the availability of in person appointments. The patient expressed understanding and agreed to proceed.  History of Present Illness:    Observations/Objective:   Assessment and Plan:   Follow Up Instructions:    I discussed the assessment and treatment plan with the patient. The patient was provided an opportunity to ask questions and all were answered. The patient agreed with the plan and demonstrated an understanding of the instructions.   The patient was advised to call back or seek an in-person evaluation if the symptoms worsen or if the condition fails to improve as anticipated.  I provided 18 minutes of non-face-to-face time during this encounter.  No energy  staryted six days ago  Then got very sleepy  Severe h a s and body aches  Slight cough   No sob  Lots of diarrhea  Nausea yest  yest tried to take a shower  Got really nauseated  A bit confused      Review of Systems No fever no major cough no shortness of breath    Objective:   Physical Exam   Virtual     Assessment & Plan:  Impression suspected COVID-19.  Discussed.  Will call in Zofran for nausea.  Warning signs discussed.  Hydration discussed.  Patient spouse starting to get symptoms of both of them will proceed with COVID-19 testing.

## 2019-07-22 ENCOUNTER — Other Ambulatory Visit: Payer: Self-pay | Admitting: *Deleted

## 2019-07-22 DIAGNOSIS — Z20822 Contact with and (suspected) exposure to covid-19: Secondary | ICD-10-CM

## 2019-07-22 DIAGNOSIS — R6889 Other general symptoms and signs: Secondary | ICD-10-CM | POA: Diagnosis not present

## 2019-07-23 LAB — NOVEL CORONAVIRUS, NAA: SARS-CoV-2, NAA: DETECTED — AB

## 2019-07-24 DIAGNOSIS — Z9889 Other specified postprocedural states: Secondary | ICD-10-CM

## 2019-07-24 DIAGNOSIS — I480 Paroxysmal atrial fibrillation: Secondary | ICD-10-CM

## 2019-07-24 DIAGNOSIS — Z9289 Personal history of other medical treatment: Secondary | ICD-10-CM

## 2019-07-24 DIAGNOSIS — N184 Chronic kidney disease, stage 4 (severe): Secondary | ICD-10-CM

## 2019-07-24 HISTORY — DX: Chronic kidney disease, stage 4 (severe): N18.4

## 2019-07-24 HISTORY — DX: Other specified postprocedural states: Z98.890

## 2019-07-24 HISTORY — DX: Paroxysmal atrial fibrillation: I48.0

## 2019-07-24 HISTORY — DX: Personal history of other medical treatment: Z92.89

## 2019-07-25 ENCOUNTER — Inpatient Hospital Stay (HOSPITAL_COMMUNITY)
Admission: EM | Admit: 2019-07-25 | Discharge: 2019-10-03 | DRG: 004 | Disposition: A | Discharge status: Rehabilitation Facility | Payer: Medicare HMO | Attending: Nephrology | Admitting: Nephrology

## 2019-07-25 ENCOUNTER — Emergency Department (HOSPITAL_COMMUNITY): Payer: Medicare HMO

## 2019-07-25 ENCOUNTER — Encounter (HOSPITAL_COMMUNITY): Payer: Self-pay | Admitting: Emergency Medicine

## 2019-07-25 ENCOUNTER — Telehealth: Payer: Self-pay | Admitting: Family Medicine

## 2019-07-25 ENCOUNTER — Other Ambulatory Visit: Payer: Self-pay

## 2019-07-25 DIAGNOSIS — D649 Anemia, unspecified: Secondary | ICD-10-CM | POA: Diagnosis not present

## 2019-07-25 DIAGNOSIS — Z9911 Dependence on respirator [ventilator] status: Secondary | ICD-10-CM | POA: Diagnosis not present

## 2019-07-25 DIAGNOSIS — E8770 Fluid overload, unspecified: Secondary | ICD-10-CM | POA: Diagnosis not present

## 2019-07-25 DIAGNOSIS — J1289 Other viral pneumonia: Secondary | ICD-10-CM | POA: Diagnosis not present

## 2019-07-25 DIAGNOSIS — Z515 Encounter for palliative care: Secondary | ICD-10-CM

## 2019-07-25 DIAGNOSIS — I953 Hypotension of hemodialysis: Secondary | ICD-10-CM | POA: Diagnosis not present

## 2019-07-25 DIAGNOSIS — R6521 Severe sepsis with septic shock: Secondary | ICD-10-CM | POA: Diagnosis not present

## 2019-07-25 DIAGNOSIS — L89153 Pressure ulcer of sacral region, stage 3: Secondary | ICD-10-CM | POA: Diagnosis not present

## 2019-07-25 DIAGNOSIS — J189 Pneumonia, unspecified organism: Secondary | ICD-10-CM | POA: Diagnosis not present

## 2019-07-25 DIAGNOSIS — Z930 Tracheostomy status: Secondary | ICD-10-CM

## 2019-07-25 DIAGNOSIS — R7881 Bacteremia: Secondary | ICD-10-CM

## 2019-07-25 DIAGNOSIS — G629 Polyneuropathy, unspecified: Secondary | ICD-10-CM | POA: Diagnosis present

## 2019-07-25 DIAGNOSIS — J9311 Primary spontaneous pneumothorax: Secondary | ICD-10-CM

## 2019-07-25 DIAGNOSIS — J9690 Respiratory failure, unspecified, unspecified whether with hypoxia or hypercapnia: Secondary | ICD-10-CM | POA: Diagnosis not present

## 2019-07-25 DIAGNOSIS — N179 Acute kidney failure, unspecified: Secondary | ICD-10-CM | POA: Diagnosis present

## 2019-07-25 DIAGNOSIS — J90 Pleural effusion, not elsewhere classified: Secondary | ICD-10-CM | POA: Diagnosis not present

## 2019-07-25 DIAGNOSIS — J151 Pneumonia due to Pseudomonas: Secondary | ICD-10-CM | POA: Diagnosis present

## 2019-07-25 DIAGNOSIS — A498 Other bacterial infections of unspecified site: Secondary | ICD-10-CM

## 2019-07-25 DIAGNOSIS — L89811 Pressure ulcer of head, stage 1: Secondary | ICD-10-CM | POA: Diagnosis present

## 2019-07-25 DIAGNOSIS — B9681 Helicobacter pylori [H. pylori] as the cause of diseases classified elsewhere: Secondary | ICD-10-CM | POA: Diagnosis present

## 2019-07-25 DIAGNOSIS — R0902 Hypoxemia: Secondary | ICD-10-CM | POA: Diagnosis not present

## 2019-07-25 DIAGNOSIS — Z960 Presence of urogenital implants: Secondary | ICD-10-CM | POA: Diagnosis not present

## 2019-07-25 DIAGNOSIS — I361 Nonrheumatic tricuspid (valve) insufficiency: Secondary | ICD-10-CM | POA: Diagnosis not present

## 2019-07-25 DIAGNOSIS — K922 Gastrointestinal hemorrhage, unspecified: Secondary | ICD-10-CM | POA: Diagnosis present

## 2019-07-25 DIAGNOSIS — J948 Other specified pleural conditions: Secondary | ICD-10-CM | POA: Diagnosis not present

## 2019-07-25 DIAGNOSIS — Z9289 Personal history of other medical treatment: Secondary | ICD-10-CM

## 2019-07-25 DIAGNOSIS — B9561 Methicillin susceptible Staphylococcus aureus infection as the cause of diseases classified elsewhere: Secondary | ICD-10-CM

## 2019-07-25 DIAGNOSIS — K567 Ileus, unspecified: Secondary | ICD-10-CM | POA: Diagnosis not present

## 2019-07-25 DIAGNOSIS — D638 Anemia in other chronic diseases classified elsewhere: Secondary | ICD-10-CM | POA: Diagnosis present

## 2019-07-25 DIAGNOSIS — J181 Lobar pneumonia, unspecified organism: Secondary | ICD-10-CM | POA: Diagnosis not present

## 2019-07-25 DIAGNOSIS — N170 Acute kidney failure with tubular necrosis: Secondary | ICD-10-CM | POA: Diagnosis present

## 2019-07-25 DIAGNOSIS — R1312 Dysphagia, oropharyngeal phase: Secondary | ICD-10-CM | POA: Diagnosis not present

## 2019-07-25 DIAGNOSIS — R579 Shock, unspecified: Secondary | ICD-10-CM | POA: Diagnosis not present

## 2019-07-25 DIAGNOSIS — E874 Mixed disorder of acid-base balance: Secondary | ICD-10-CM | POA: Diagnosis present

## 2019-07-25 DIAGNOSIS — A4101 Sepsis due to Methicillin susceptible Staphylococcus aureus: Secondary | ICD-10-CM | POA: Diagnosis not present

## 2019-07-25 DIAGNOSIS — F329 Major depressive disorder, single episode, unspecified: Secondary | ICD-10-CM | POA: Diagnosis not present

## 2019-07-25 DIAGNOSIS — R0689 Other abnormalities of breathing: Secondary | ICD-10-CM | POA: Diagnosis not present

## 2019-07-25 DIAGNOSIS — Z992 Dependence on renal dialysis: Secondary | ICD-10-CM

## 2019-07-25 DIAGNOSIS — R7989 Other specified abnormal findings of blood chemistry: Secondary | ICD-10-CM

## 2019-07-25 DIAGNOSIS — R918 Other nonspecific abnormal finding of lung field: Secondary | ICD-10-CM | POA: Diagnosis not present

## 2019-07-25 DIAGNOSIS — G7281 Critical illness myopathy: Secondary | ICD-10-CM | POA: Diagnosis not present

## 2019-07-25 DIAGNOSIS — J9611 Chronic respiratory failure with hypoxia: Secondary | ICD-10-CM | POA: Diagnosis not present

## 2019-07-25 DIAGNOSIS — I4891 Unspecified atrial fibrillation: Secondary | ICD-10-CM | POA: Diagnosis present

## 2019-07-25 DIAGNOSIS — R069 Unspecified abnormalities of breathing: Secondary | ICD-10-CM | POA: Diagnosis not present

## 2019-07-25 DIAGNOSIS — E872 Acidosis: Secondary | ICD-10-CM | POA: Diagnosis not present

## 2019-07-25 DIAGNOSIS — Y95 Nosocomial condition: Secondary | ICD-10-CM | POA: Diagnosis present

## 2019-07-25 DIAGNOSIS — Z4659 Encounter for fitting and adjustment of other gastrointestinal appliance and device: Secondary | ICD-10-CM | POA: Diagnosis not present

## 2019-07-25 DIAGNOSIS — A048 Other specified bacterial intestinal infections: Secondary | ICD-10-CM | POA: Diagnosis not present

## 2019-07-25 DIAGNOSIS — K921 Melena: Secondary | ICD-10-CM

## 2019-07-25 DIAGNOSIS — Z8616 Personal history of COVID-19: Secondary | ICD-10-CM

## 2019-07-25 DIAGNOSIS — J860 Pyothorax with fistula: Secondary | ICD-10-CM | POA: Diagnosis not present

## 2019-07-25 DIAGNOSIS — Z23 Encounter for immunization: Secondary | ICD-10-CM

## 2019-07-25 DIAGNOSIS — M7989 Other specified soft tissue disorders: Secondary | ICD-10-CM | POA: Diagnosis not present

## 2019-07-25 DIAGNOSIS — Z8701 Personal history of pneumonia (recurrent): Secondary | ICD-10-CM | POA: Diagnosis not present

## 2019-07-25 DIAGNOSIS — R5381 Other malaise: Secondary | ICD-10-CM | POA: Diagnosis present

## 2019-07-25 DIAGNOSIS — T859XXA Unspecified complication of internal prosthetic device, implant and graft, initial encounter: Secondary | ICD-10-CM | POA: Diagnosis not present

## 2019-07-25 DIAGNOSIS — Z978 Presence of other specified devices: Secondary | ICD-10-CM

## 2019-07-25 DIAGNOSIS — Z6829 Body mass index (BMI) 29.0-29.9, adult: Secondary | ICD-10-CM

## 2019-07-25 DIAGNOSIS — R000 Tachycardia, unspecified: Secondary | ICD-10-CM | POA: Diagnosis not present

## 2019-07-25 DIAGNOSIS — A419 Sepsis, unspecified organism: Secondary | ICD-10-CM | POA: Diagnosis not present

## 2019-07-25 DIAGNOSIS — G4700 Insomnia, unspecified: Secondary | ICD-10-CM | POA: Diagnosis not present

## 2019-07-25 DIAGNOSIS — I69391 Dysphagia following cerebral infarction: Secondary | ICD-10-CM | POA: Diagnosis not present

## 2019-07-25 DIAGNOSIS — G931 Anoxic brain damage, not elsewhere classified: Secondary | ICD-10-CM | POA: Clinically undetermined

## 2019-07-25 DIAGNOSIS — Y838 Other surgical procedures as the cause of abnormal reaction of the patient, or of later complication, without mention of misadventure at the time of the procedure: Secondary | ICD-10-CM | POA: Diagnosis not present

## 2019-07-25 DIAGNOSIS — R059 Cough, unspecified: Secondary | ICD-10-CM

## 2019-07-25 DIAGNOSIS — K259 Gastric ulcer, unspecified as acute or chronic, without hemorrhage or perforation: Secondary | ICD-10-CM | POA: Diagnosis not present

## 2019-07-25 DIAGNOSIS — Z7189 Other specified counseling: Secondary | ICD-10-CM

## 2019-07-25 DIAGNOSIS — G9341 Metabolic encephalopathy: Secondary | ICD-10-CM | POA: Diagnosis not present

## 2019-07-25 DIAGNOSIS — K219 Gastro-esophageal reflux disease without esophagitis: Secondary | ICD-10-CM | POA: Diagnosis present

## 2019-07-25 DIAGNOSIS — E875 Hyperkalemia: Secondary | ICD-10-CM | POA: Diagnosis not present

## 2019-07-25 DIAGNOSIS — G479 Sleep disorder, unspecified: Secondary | ICD-10-CM | POA: Diagnosis not present

## 2019-07-25 DIAGNOSIS — K668 Other specified disorders of peritoneum: Secondary | ICD-10-CM

## 2019-07-25 DIAGNOSIS — L89152 Pressure ulcer of sacral region, stage 2: Secondary | ICD-10-CM | POA: Diagnosis not present

## 2019-07-25 DIAGNOSIS — K250 Acute gastric ulcer with hemorrhage: Secondary | ICD-10-CM | POA: Diagnosis not present

## 2019-07-25 DIAGNOSIS — J939 Pneumothorax, unspecified: Secondary | ICD-10-CM | POA: Diagnosis not present

## 2019-07-25 DIAGNOSIS — R0602 Shortness of breath: Secondary | ICD-10-CM | POA: Diagnosis not present

## 2019-07-25 DIAGNOSIS — E46 Unspecified protein-calorie malnutrition: Secondary | ICD-10-CM | POA: Diagnosis present

## 2019-07-25 DIAGNOSIS — J9601 Acute respiratory failure with hypoxia: Secondary | ICD-10-CM

## 2019-07-25 DIAGNOSIS — N186 End stage renal disease: Secondary | ICD-10-CM | POA: Diagnosis not present

## 2019-07-25 DIAGNOSIS — K254 Chronic or unspecified gastric ulcer with hemorrhage: Secondary | ICD-10-CM | POA: Diagnosis present

## 2019-07-25 DIAGNOSIS — Z9689 Presence of other specified functional implants: Secondary | ICD-10-CM

## 2019-07-25 DIAGNOSIS — I480 Paroxysmal atrial fibrillation: Secondary | ICD-10-CM | POA: Diagnosis present

## 2019-07-25 DIAGNOSIS — R4182 Altered mental status, unspecified: Secondary | ICD-10-CM | POA: Diagnosis not present

## 2019-07-25 DIAGNOSIS — Z4682 Encounter for fitting and adjustment of non-vascular catheter: Secondary | ICD-10-CM

## 2019-07-25 DIAGNOSIS — J95812 Postprocedural air leak: Secondary | ICD-10-CM | POA: Diagnosis not present

## 2019-07-25 DIAGNOSIS — D6859 Other primary thrombophilia: Secondary | ICD-10-CM | POA: Diagnosis present

## 2019-07-25 DIAGNOSIS — E2740 Unspecified adrenocortical insufficiency: Secondary | ICD-10-CM | POA: Diagnosis present

## 2019-07-25 DIAGNOSIS — D62 Acute posthemorrhagic anemia: Secondary | ICD-10-CM | POA: Diagnosis not present

## 2019-07-25 DIAGNOSIS — J9600 Acute respiratory failure, unspecified whether with hypoxia or hypercapnia: Secondary | ICD-10-CM

## 2019-07-25 DIAGNOSIS — I959 Hypotension, unspecified: Secondary | ICD-10-CM | POA: Diagnosis not present

## 2019-07-25 DIAGNOSIS — B370 Candidal stomatitis: Secondary | ICD-10-CM | POA: Diagnosis not present

## 2019-07-25 DIAGNOSIS — J984 Other disorders of lung: Secondary | ICD-10-CM | POA: Diagnosis not present

## 2019-07-25 DIAGNOSIS — I6330 Cerebral infarction due to thrombosis of unspecified cerebral artery: Secondary | ICD-10-CM | POA: Diagnosis not present

## 2019-07-25 DIAGNOSIS — R739 Hyperglycemia, unspecified: Secondary | ICD-10-CM | POA: Diagnosis not present

## 2019-07-25 DIAGNOSIS — R29810 Facial weakness: Secondary | ICD-10-CM | POA: Diagnosis not present

## 2019-07-25 DIAGNOSIS — Z8619 Personal history of other infectious and parasitic diseases: Secondary | ICD-10-CM | POA: Diagnosis not present

## 2019-07-25 DIAGNOSIS — I120 Hypertensive chronic kidney disease with stage 5 chronic kidney disease or end stage renal disease: Secondary | ICD-10-CM | POA: Diagnosis present

## 2019-07-25 DIAGNOSIS — G8194 Hemiplegia, unspecified affecting left nondominant side: Secondary | ICD-10-CM | POA: Diagnosis not present

## 2019-07-25 DIAGNOSIS — Z66 Do not resuscitate: Secondary | ICD-10-CM | POA: Diagnosis not present

## 2019-07-25 DIAGNOSIS — N2581 Secondary hyperparathyroidism of renal origin: Secondary | ICD-10-CM | POA: Diagnosis not present

## 2019-07-25 DIAGNOSIS — R05 Cough: Secondary | ICD-10-CM | POA: Diagnosis not present

## 2019-07-25 DIAGNOSIS — E870 Hyperosmolality and hypernatremia: Secondary | ICD-10-CM | POA: Diagnosis not present

## 2019-07-25 DIAGNOSIS — E8779 Other fluid overload: Secondary | ICD-10-CM | POA: Diagnosis not present

## 2019-07-25 DIAGNOSIS — J9811 Atelectasis: Secondary | ICD-10-CM | POA: Diagnosis not present

## 2019-07-25 DIAGNOSIS — R42 Dizziness and giddiness: Secondary | ICD-10-CM | POA: Diagnosis not present

## 2019-07-25 DIAGNOSIS — R338 Other retention of urine: Secondary | ICD-10-CM | POA: Diagnosis not present

## 2019-07-25 DIAGNOSIS — R29731 NIHSS score 31: Secondary | ICD-10-CM | POA: Diagnosis not present

## 2019-07-25 DIAGNOSIS — U071 COVID-19: Principal | ICD-10-CM | POA: Diagnosis present

## 2019-07-25 DIAGNOSIS — Z452 Encounter for adjustment and management of vascular access device: Secondary | ICD-10-CM

## 2019-07-25 DIAGNOSIS — K5900 Constipation, unspecified: Secondary | ICD-10-CM | POA: Diagnosis present

## 2019-07-25 DIAGNOSIS — R531 Weakness: Secondary | ICD-10-CM | POA: Diagnosis not present

## 2019-07-25 DIAGNOSIS — J930 Spontaneous tension pneumothorax: Secondary | ICD-10-CM | POA: Diagnosis not present

## 2019-07-25 DIAGNOSIS — R159 Full incontinence of feces: Secondary | ICD-10-CM | POA: Diagnosis not present

## 2019-07-25 DIAGNOSIS — R627 Adult failure to thrive: Secondary | ICD-10-CM | POA: Diagnosis present

## 2019-07-25 DIAGNOSIS — Z9119 Patient's noncompliance with other medical treatment and regimen: Secondary | ICD-10-CM

## 2019-07-25 DIAGNOSIS — J15211 Pneumonia due to Methicillin susceptible Staphylococcus aureus: Secondary | ICD-10-CM | POA: Diagnosis present

## 2019-07-25 DIAGNOSIS — I69354 Hemiplegia and hemiparesis following cerebral infarction affecting left non-dominant side: Secondary | ICD-10-CM | POA: Diagnosis not present

## 2019-07-25 DIAGNOSIS — I63039 Cerebral infarction due to thrombosis of unspecified carotid artery: Secondary | ICD-10-CM | POA: Diagnosis not present

## 2019-07-25 DIAGNOSIS — R509 Fever, unspecified: Secondary | ICD-10-CM | POA: Diagnosis not present

## 2019-07-25 DIAGNOSIS — Z8669 Personal history of other diseases of the nervous system and sense organs: Secondary | ICD-10-CM

## 2019-07-25 DIAGNOSIS — I639 Cerebral infarction, unspecified: Secondary | ICD-10-CM | POA: Diagnosis not present

## 2019-07-25 DIAGNOSIS — B948 Sequelae of other specified infectious and parasitic diseases: Secondary | ICD-10-CM | POA: Diagnosis not present

## 2019-07-25 DIAGNOSIS — Z95828 Presence of other vascular implants and grafts: Secondary | ICD-10-CM

## 2019-07-25 DIAGNOSIS — K5909 Other constipation: Secondary | ICD-10-CM | POA: Diagnosis not present

## 2019-07-25 DIAGNOSIS — J80 Acute respiratory distress syndrome: Secondary | ICD-10-CM | POA: Diagnosis not present

## 2019-07-25 DIAGNOSIS — E785 Hyperlipidemia, unspecified: Secondary | ICD-10-CM | POA: Diagnosis not present

## 2019-07-25 DIAGNOSIS — J129 Viral pneumonia, unspecified: Secondary | ICD-10-CM | POA: Diagnosis not present

## 2019-07-25 DIAGNOSIS — Z79899 Other long term (current) drug therapy: Secondary | ICD-10-CM

## 2019-07-25 DIAGNOSIS — Z4901 Encounter for fitting and adjustment of extracorporeal dialysis catheter: Secondary | ICD-10-CM | POA: Diagnosis not present

## 2019-07-25 DIAGNOSIS — D72829 Elevated white blood cell count, unspecified: Secondary | ICD-10-CM | POA: Diagnosis not present

## 2019-07-25 DIAGNOSIS — G9349 Other encephalopathy: Secondary | ICD-10-CM | POA: Diagnosis not present

## 2019-07-25 DIAGNOSIS — N400 Enlarged prostate without lower urinary tract symptoms: Secondary | ICD-10-CM | POA: Diagnosis not present

## 2019-07-25 DIAGNOSIS — A4901 Methicillin susceptible Staphylococcus aureus infection, unspecified site: Secondary | ICD-10-CM | POA: Diagnosis not present

## 2019-07-25 DIAGNOSIS — N401 Enlarged prostate with lower urinary tract symptoms: Secondary | ICD-10-CM | POA: Diagnosis not present

## 2019-07-25 DIAGNOSIS — R112 Nausea with vomiting, unspecified: Secondary | ICD-10-CM

## 2019-07-25 DIAGNOSIS — J1282 Pneumonia due to coronavirus disease 2019: Secondary | ICD-10-CM

## 2019-07-25 DIAGNOSIS — L89150 Pressure ulcer of sacral region, unstageable: Secondary | ICD-10-CM | POA: Diagnosis not present

## 2019-07-25 HISTORY — DX: Personal history of COVID-19: Z86.16

## 2019-07-25 HISTORY — DX: Personal history of other diseases of the nervous system and sense organs: Z86.69

## 2019-07-25 HISTORY — DX: COVID-19: U07.1

## 2019-07-25 LAB — BLOOD GAS, ARTERIAL
Acid-Base Excess: 2.5 mmol/L — ABNORMAL HIGH (ref 0.0–2.0)
Acid-Base Excess: 3.4 mmol/L — ABNORMAL HIGH (ref 0.0–2.0)
Bicarbonate: 25.8 mmol/L (ref 20.0–28.0)
Bicarbonate: 26.8 mmol/L (ref 20.0–28.0)
FIO2: 100
FIO2: 100
O2 Saturation: 96.4 %
O2 Saturation: 95.4 %
Patient temperature: 39
Patient temperature: 37
pCO2 arterial: 51.1 mmHg — ABNORMAL HIGH (ref 32.0–48.0)
pCO2 arterial: 44 mmHg (ref 32.0–48.0)
pH, Arterial: 7.351 (ref 7.350–7.450)
pH, Arterial: 7.414 (ref 7.350–7.450)
pO2, Arterial: 106 mmHg (ref 83.0–108.0)
pO2, Arterial: 87.8 mmHg (ref 83.0–108.0)

## 2019-07-25 LAB — COMPREHENSIVE METABOLIC PANEL
ALT: 31 U/L (ref 0–44)
AST: 52 U/L — ABNORMAL HIGH (ref 15–41)
Albumin: 3.1 g/dL — ABNORMAL LOW (ref 3.5–5.0)
Alkaline Phosphatase: 54 U/L (ref 38–126)
Anion gap: 13 (ref 5–15)
BUN: 26 mg/dL — ABNORMAL HIGH (ref 8–23)
CO2: 24 mmol/L (ref 22–32)
Calcium: 8.4 mg/dL — ABNORMAL LOW (ref 8.9–10.3)
Chloride: 97 mmol/L — ABNORMAL LOW (ref 98–111)
Creatinine, Ser: 1.51 mg/dL — ABNORMAL HIGH (ref 0.61–1.24)
GFR calc Af Amer: 55 mL/min — ABNORMAL LOW (ref >60)
GFR calc non Af Amer: 47 mL/min — ABNORMAL LOW (ref >60)
Glucose, Bld: 131 mg/dL — ABNORMAL HIGH (ref 70–99)
Potassium: 3.9 mmol/L (ref 3.5–5.1)
Sodium: 134 mmol/L — ABNORMAL LOW (ref 135–145)
Total Bilirubin: 0.9 mg/dL (ref 0.3–1.2)
Total Protein: 7.2 g/dL (ref 6.5–8.1)

## 2019-07-25 LAB — CBC WITH DIFFERENTIAL/PLATELET
Abs Immature Granulocytes: 0.09 10*3/uL — ABNORMAL HIGH (ref 0.00–0.07)
Basophils Absolute: 0 10*3/uL (ref 0.0–0.1)
Basophils Relative: 0 %
Eosinophils Absolute: 0 10*3/uL (ref 0.0–0.5)
Eosinophils Relative: 0 %
HCT: 40.1 % (ref 39.0–52.0)
Hemoglobin: 13.2 g/dL (ref 13.0–17.0)
Immature Granulocytes: 1 %
Lymphocytes Relative: 7 %
Lymphs Abs: 0.7 10*3/uL (ref 0.7–4.0)
MCH: 30.4 pg (ref 26.0–34.0)
MCHC: 32.9 g/dL (ref 30.0–36.0)
MCV: 92.4 fL (ref 80.0–100.0)
Monocytes Absolute: 0.4 10*3/uL (ref 0.1–1.0)
Monocytes Relative: 4 %
Neutro Abs: 9 10*3/uL — ABNORMAL HIGH (ref 1.7–7.7)
Neutrophils Relative %: 88 %
Platelets: 377 10*3/uL (ref 150–400)
RBC: 4.34 MIL/uL (ref 4.22–5.81)
RDW: 13.4 % (ref 11.5–15.5)
WBC: 10.2 10*3/uL (ref 4.0–10.5)
nRBC: 0.2 % (ref 0.0–0.2)

## 2019-07-25 LAB — URINALYSIS, ROUTINE W REFLEX MICROSCOPIC
Bilirubin Urine: NEGATIVE
Glucose, UA: NEGATIVE mg/dL
Ketones, ur: NEGATIVE mg/dL
Leukocytes,Ua: NEGATIVE
Nitrite: NEGATIVE
Protein, ur: 100 mg/dL — AB
Specific Gravity, Urine: 1.027 (ref 1.005–1.030)
pH: 5 (ref 5.0–8.0)

## 2019-07-25 LAB — TRIGLYCERIDES: Triglycerides: 99 mg/dL (ref <150)

## 2019-07-25 LAB — FIBRINOGEN: Fibrinogen: >800 mg/dL — ABNORMAL HIGH (ref 210–475)

## 2019-07-25 LAB — LACTIC ACID, PLASMA
Lactic Acid, Venous: 2.2 mmol/L (ref 0.5–1.9)
Lactic Acid, Venous: 1.2 mmol/L (ref 0.5–1.9)

## 2019-07-25 LAB — PROCALCITONIN: Procalcitonin: 3.62 ng/mL

## 2019-07-25 LAB — D-DIMER, QUANTITATIVE: D-Dimer, Quant: >20 ug/mL-FEU — ABNORMAL HIGH (ref 0.00–0.50)

## 2019-07-25 LAB — C-REACTIVE PROTEIN: CRP: 19.2 mg/dL — ABNORMAL HIGH (ref <1.0)

## 2019-07-25 LAB — LACTATE DEHYDROGENASE: LDH: 511 U/L — ABNORMAL HIGH (ref 98–192)

## 2019-07-25 LAB — FERRITIN: Ferritin: 1417 ng/mL — ABNORMAL HIGH (ref 24–336)

## 2019-07-25 MED ORDER — SODIUM CHLORIDE 0.9 % IV BOLUS
500.0000 mL | Freq: Once | INTRAVENOUS | Status: AC
  Administered 2019-07-25: 500 mL via INTRAVENOUS

## 2019-07-25 MED ORDER — ONDANSETRON HCL 4 MG/2ML IJ SOLN
4.0000 mg | Freq: Once | INTRAMUSCULAR | Status: AC
  Administered 2019-07-25: 19:00:00 4 mg via INTRAVENOUS
  Filled 2019-07-25: qty 2

## 2019-07-25 MED ORDER — ONDANSETRON HCL 4 MG/2ML IJ SOLN
INTRAMUSCULAR | Status: AC
  Administered 2019-07-25: 4 mg via INTRAVENOUS
  Filled 2019-07-25: qty 2

## 2019-07-25 MED ORDER — PROPOFOL 1000 MG/100ML IV EMUL
5.0000 ug/kg/min | INTRAVENOUS | Status: DC
  Administered 2019-07-25: 40 ug/kg/min via INTRAVENOUS
  Administered 2019-07-25: 5 ug/kg/min via INTRAVENOUS
  Filled 2019-07-25 (×3): qty 100

## 2019-07-25 MED ORDER — ACETAMINOPHEN 650 MG RE SUPP
975.0000 mg | Freq: Once | RECTAL | Status: AC
  Administered 2019-07-25: 975 mg via RECTAL
  Filled 2019-07-25: qty 1

## 2019-07-25 MED ORDER — FENTANYL 2500MCG IN NS 250ML (10MCG/ML) PREMIX INFUSION
0.0000 ug/h | INTRAVENOUS | Status: DC
  Administered 2019-07-25: 20:00:00 25 ug/h via INTRAVENOUS
  Administered 2019-07-26 – 2019-07-28 (×4): 150 ug/h via INTRAVENOUS
  Administered 2019-07-29: 100 ug/h via INTRAVENOUS
  Filled 2019-07-25 (×7): qty 250

## 2019-07-25 MED ORDER — IOHEXOL 350 MG/ML SOLN
100.0000 mL | Freq: Once | INTRAVENOUS | Status: AC | PRN
  Administered 2019-07-25: 21:00:00 100 mL via INTRAVENOUS

## 2019-07-25 MED ORDER — DEXAMETHASONE SODIUM PHOSPHATE 10 MG/ML IJ SOLN
6.0000 mg | Freq: Once | INTRAMUSCULAR | Status: AC
  Administered 2019-07-25: 6 mg via INTRAVENOUS
  Filled 2019-07-25: qty 1

## 2019-07-25 MED ORDER — KETAMINE HCL 10 MG/ML IJ SOLN
INTRAMUSCULAR | Status: AC
  Filled 2019-07-25: qty 1

## 2019-07-25 MED ORDER — ROCURONIUM BROMIDE 50 MG/5ML IV SOLN
INTRAVENOUS | Status: AC | PRN
  Administered 2019-07-25: 100 mg via INTRAVENOUS

## 2019-07-25 MED ORDER — ETOMIDATE 2 MG/ML IV SOLN
INTRAVENOUS | Status: AC | PRN
  Administered 2019-07-25: 25 mg via INTRAVENOUS

## 2019-07-25 NOTE — Progress Notes (Signed)
CT scan showing 2 cm above carina. New abg has also been acquired.

## 2019-07-25 NOTE — ED Notes (Signed)
Date and time results received: 07/25/19 1802  Test: Lactic Acid Critical Value: 2.2  Name of Provider Notified: Dr. Sedonia Small Orders Received? Or Actions Taken?: See chart

## 2019-07-25 NOTE — ED Triage Notes (Signed)
Per EMS, pt from home. Pt diagnosed with COVID on Tuesday. Wife called out for increased work of breathing. EMS reports sats were 41 on RA when they arrived. Pt placed on NRB, sats in 80s. Pt mildly confused at this time.

## 2019-07-25 NOTE — ED Notes (Signed)
Picking up some pink secretions in hme.

## 2019-07-25 NOTE — ED Notes (Addendum)
Advanced et tube to 28 cm , IT appeared to be about 25 at lip. X-ray still showing high at 25. Cat scan pending will recheck ETube placement. OF note it was difficult to insert father to 37.  Tube is about as far as possible with out changing et tube. Cuff pressures have been reduced to around 30 to get mov.

## 2019-07-25 NOTE — ED Notes (Signed)
Xray notified STAT chest to verify tube and OG placement.

## 2019-07-25 NOTE — ED Notes (Signed)
Called pharmacy to verify fentanyl drip

## 2019-07-25 NOTE — Telephone Encounter (Signed)
I have been in contact with the patient via talking with his wife on September 30 October 1 and today.  On September 30 I did review over warning signs to watch for.  If difficulty breathing, passing out, significant shortness of breath, inability to keep anything down then to go to ER right away.  On September 30 his wife had a virtual visit.  This was because she was COVID positive.  Not having any symptoms.  She related that her husband was COVID positive and he had GI symptoms initially then fatigue and tiredness and then on September 30 developed a slight cough but no chest pains no fever no chills no shortness of breath  On October 1, when I spoke with the wife, patient was more fatigued able to drink able to eat small amounts of food but denied feeling short of breath.  Patient would move around the house but not much.  October 2 when I spoke with the wife regarding how both of them were doing.  She relates she was doing fine but she felt that her husband was being hardheaded.  He was refusing to get out of bed.  He would drink but he would not eat.  When I question her regarding his breathing she stated that today it seems faster and more shallow.  She held the phone up to him and it was apparent he was tachypneic.  I did speak with the ER.  They stated that they would be looking out for him.  I also spoke with the wife about the importance of calling 911. She stated they would call 911 right away.

## 2019-07-25 NOTE — Plan of Care (Signed)
I have called pt's wife Roye Gustafson at [phone]. Discussed off-label use of Actemra. She denies pt having hx of hepatitis A/B/C, hx of tuberculosis or any active cancers.  She gives verbal informed consent to proceed with IV Actemra on patient.  Risks and benefits discussed with her.  Opportunities to ask questions were given.

## 2019-07-25 NOTE — ED Notes (Signed)
X-ray at bedside

## 2019-07-25 NOTE — ED Provider Notes (Signed)
[facility] Emergency Department Provider Note MRN:  [id number]  Arrival date & time: 07/25/19     Chief Complaint   Shortness of Breath   History of Present Illness   [name] is a 68 y.o. year-old male with a history of heartburn presenting to the ED with chief complaint of shortness of breath.  Patient has been ill with cough and shortness of breath for 5 days.  Tested positive for COVID-19 3 days ago.  EMS called today for worsening shortness of breath.  Found to be tachypneic with pulse ox of 48% on room air.  Only improving to 83% nonrebreather.  Patient is confused.  I was unable to obtain an accurate HPI, PMH, or ROS due to the patient's altered mental status.  Level 5 caveat.  Review of Systems  Positive for cough, shortness of breath, altered mental status.  Patient's Health History    Past Medical History:  Diagnosis Date   COVID-19    Heartburn     Past Surgical History:  Procedure Laterality Date   ACHILLES TENDON SURGERY Right 2012   COLONOSCOPY  2009   [facility]   ESOPHAGOGASTRODUODENOSCOPY N/A 12/25/2014   Procedure: ESOPHAGOGASTRODUODENOSCOPY (EGD);  Surgeon: Danie Binder, MD;  Location: [facility];  Service: Endoscopy;  Laterality: N/A;  830am   KNEE SURGERY Left 1980's   none     WRIST SURGERY Left 1970's    Family History  Problem Relation Age of Onset   Colon cancer Neg Hx    Colon polyps Neg Hx     Social History   Socioeconomic History   Marital status: Married    Spouse name: Not on file   Number of children: Not on file   Years of education: bachelors   Highest education level: Not on file  Occupational History   Occupation: Scientist, clinical (histocompatibility and immunogenetics): [employer] resource strain: Not on file   Food insecurity    Worry: Not on file    Inability: Not on file   Transportation needs    Medical: Not on file    Non-medical: Not on file  Tobacco Use   Smoking status: Never  Smoker   Smokeless tobacco: Never Used  Substance and Sexual Activity   Alcohol use: Yes    Comment: moderate   Drug use: No   Sexual activity: Not on file  Lifestyle   Physical activity    Days per week: Not on file    Minutes per session: Not on file   Stress: Not on file  Relationships   Social connections    Talks on phone: Not on file    Gets together: Not on file    Attends religious service: Not on file    Active member of club or organization: Not on file    Attends meetings of clubs or organizations: Not on file    Relationship status: Not on file   Intimate partner violence    Fear of current or ex partner: Not on file    Emotionally abused: Not on file    Physically abused: Not on file    Forced sexual activity: Not on file  Other Topics Concern   Not on file  Social History Narrative   Not on file     Physical Exam  Vital Signs and Nursing Notes reviewed Vitals:   07/25/19 1910 07/25/19 1920  BP: 100/69 96/65  Pulse: 92 92  Resp: Marland Kitchen)  28 19  Temp: (!) 101.1 F (38.4 C) (!) 100.9 F (38.3 C)  SpO2: 90% (!) 88%    CONSTITUTIONAL: Ill-appearing, NAD NEURO: Oriented to name only, no focal neurological deficits EYES:  eyes equal and reactive ENT/NECK:  no LAD, no JVD CARDIO: Regular rate, well-perfused, normal S1 and S2 PULM: Decreased breath sounds bilateral bases GI/GU:  normal bowel sounds, non-distended, non-tender MSK/SPINE:  No gross deformities, no edema SKIN:  no rash, atraumatic PSYCH:  Appropriate speech and behavior  Diagnostic and Interventional Summary    EKG Interpretation  Date/Time:  Friday July 25 2019 17:00:39 EDT Ventricular Rate:  122 PR Interval:    QRS Duration: 104 QT Interval:  359 QTC Calculation: 512 R Axis:   -86 Text Interpretation:  Sinus tachycardia Left atrial enlargement Left anterior fascicular block Probable anterior infarct, age indeterminate Prolonged QT interval Confirmed by Gerlene Fee [phone])  on 07/25/2019 7:28:19 PM      Labs Reviewed  LACTIC ACID, PLASMA - Abnormal; Notable for the following components:      Result Value   Lactic Acid, Venous 2.2 (*)    All other components within normal limits  CBC WITH DIFFERENTIAL/PLATELET - Abnormal; Notable for the following components:   Neutro Abs 9.0 (*)    Abs Immature Granulocytes 0.09 (*)    All other components within normal limits  COMPREHENSIVE METABOLIC PANEL - Abnormal; Notable for the following components:   Sodium 134 (*)    Chloride 97 (*)    Glucose, Bld 131 (*)    BUN 26 (*)    Creatinine, Ser 1.51 (*)    Calcium 8.4 (*)    Albumin 3.1 (*)    AST 52 (*)    GFR calc non Af Amer 47 (*)    GFR calc Af Amer 55 (*)    All other components within normal limits  D-DIMER, QUANTITATIVE (NOT AT [facility]) - Abnormal; Notable for the following components:   D-Dimer, Quant >20.00 (*)    All other components within normal limits  LACTATE DEHYDROGENASE - Abnormal; Notable for the following components:   LDH 511 (*)    All other components within normal limits  FIBRINOGEN - Abnormal; Notable for the following components:   Fibrinogen >800 (*)    All other components within normal limits  URINALYSIS, ROUTINE W REFLEX MICROSCOPIC - Abnormal; Notable for the following components:   APPearance HAZY (*)    Hgb urine dipstick MODERATE (*)    Protein, ur 100 (*)    Bacteria, UA RARE (*)    All other components within normal limits  BLOOD GAS, ARTERIAL - Abnormal; Notable for the following components:   pCO2 arterial 51.1 (*)    Acid-Base Excess 2.5 (*)    All other components within normal limits  CULTURE, BLOOD (ROUTINE X 2)  CULTURE, BLOOD (ROUTINE X 2)  PROCALCITONIN  TRIGLYCERIDES  LACTIC ACID, PLASMA  FERRITIN  C-REACTIVE PROTEIN    DG Chest Port 1 View  Final Result    CT ANGIO CHEST PE W OR WO CONTRAST    (Results Pending)  DG Chest Port 1 View    (Results Pending)  DG Chest Port 1 View    (Results Pending)     Medications  propofol (DIPRIVAN) 1000 MG/100ML infusion (30 mcg/kg/min  96 kg Intravenous Rate/Dose Change 07/25/19 1923)  fentaNYL 2533mcg in NS 284mL (110mcg/ml) infusion-PREMIX (has no administration in time range)  etomidate (AMIDATE) injection (25 mg Intravenous Given 07/25/19 1647)  rocuronium (ZEMURON)  injection (100 mg Intravenous Given 07/25/19 1647)  dexamethasone (DECADRON) injection 6 mg (6 mg Intravenous Given 07/25/19 1711)  sodium chloride 0.9 % bolus 500 mL (500 mLs Intravenous New Bag/Given 07/25/19 1803)  acetaminophen (TYLENOL) suppository 975 mg (975 mg Rectal Given 07/25/19 1757)  ondansetron (ZOFRAN) injection 4 mg (4 mg Intravenous Given by Other 07/25/19 1830)     Procedure Name: Intubation Date/Time: 07/25/2019 5:08 PM Performed by: Maudie Flakes, MD Pre-anesthesia Checklist: Patient identified, Patient being monitored, Emergency Drugs available, Timeout performed and Suction available Oxygen Delivery Method: Non-rebreather mask Preoxygenation: Pre-oxygenation with 100% oxygen Induction Type: Rapid sequence Ventilation: Mask ventilation without difficulty Laryngoscope Size: Glidescope and 4 Grade View: Grade I Tube size: 7.5 mm Number of attempts: 1 Airway Equipment and Method: Rigid stylet Placement Confirmation: ETT inserted through vocal cords under direct vision,  CO2 detector and Breath sounds checked- equal and bilateral Secured at: 23 cm Tube secured with: ETT holder Comments: Patient was unable to be fully preoxygenated due to hypoxic respiratory failure.  Began RSI at 87%, ET tube quickly placed through the cords with this minimal delay due to large tongue and small mouth.  After successful color change, there was desaturation into the 50s followed by quick return to 90%.  25 mg etomidate and 100 mg rocuronium used.     Linus Salmons Line  Date/Time: 07/25/2019 7:29 PM Performed by: Maudie Flakes, MD Authorized by: Maudie Flakes, MD   Consent:     Consent obtained:  Emergent situation Pre-procedure details:    Hand hygiene: Hand hygiene performed prior to insertion     Sterile barrier technique: All elements of maximal sterile technique followed     Skin preparation:  2% chlorhexidine   Skin preparation agent: Skin preparation agent completely dried prior to procedure   Sedation:    Sedation type:  Deep (propofol drip) Anesthesia (see MAR for exact dosages):    Anesthesia method:  None Procedure details:    Location:  R internal jugular   Patient position:  Trendelenburg   Procedural supplies:  Triple lumen   Catheter size:  7 Fr   Landmarks identified: yes     Ultrasound guidance: yes     Number of attempts:  2   Successful placement: yes   Post-procedure details:    Post-procedure:  Dressing applied and line sutured   Assessment:  Blood return through all ports and free fluid flow   Patient tolerance of procedure:  Tolerated well, no immediate complications   Critical Care Critical Care Documentation Critical care time provided by me (excluding procedures): 35 minutes  Condition necessitating critical care: Hypoxic respiratory failure in the setting of COVID-19  Components of critical care management: reviewing of prior records, laboratory and imaging interpretation, frequent re-examination and reassessment of vital signs, administration of IV Decadron, ventilatory management, discussion with consulting services.    ED Course and Medical Decision Making  I have reviewed the triage vital signs and the nursing notes.  Pertinent labs & imaging results that were available during my care of the patient were reviewed by me and considered in my medical decision making (see below for details).  Confirmed COVID-19 infection 3 days ago, profound hypoxia at home, unable to improve saturations with maximal supplemental oxygen, patient confused seemingly due to the hypoxia.  Emergently intubated as described above.  Will admit to  [facility] intensivist service.  7 PM update: Central line placed at the request of intensivist service so that he can be  used at the [facility].  Patient has been having soft blood pressures while on propofol drip, improves with decrease in rate.  Traivon Morrical was evaluated in Emergency Department on 07/25/2019 for the symptoms described in the history of present illness. He was evaluated in the context of the global COVID-19 pandemic, which necessitated consideration that the patient might be at risk for infection with the SARS-CoV-2 virus that causes COVID-19. Institutional protocols and algorithms that pertain to the evaluation of patients at risk for COVID-19 are in a state of rapid change based on information released by regulatory bodies including the CDC and federal and state organizations. These policies and algorithms were followed during the patient's care in the ED.   Barth Kirks. Sedonia Small, MD [place] [email or web address]  Final Clinical Impressions(s) / ED Diagnoses     ICD-10-CM   1. COVID-19  U07.1   2. Acute respiratory failure with hypoxia (HCC)  J96.01   3. Encounter for central line placement  Z45.2 DG Chest Port 1 View    DG Chest Port 1 View    ED Discharge Orders    None      Discharge Instructions Discussed with and Provided to Patient: Discharge Instructions   None       Maudie Flakes, MD 07/25/19 1932

## 2019-07-26 ENCOUNTER — Inpatient Hospital Stay (HOSPITAL_COMMUNITY): Payer: Medicare HMO

## 2019-07-26 DIAGNOSIS — J1289 Other viral pneumonia: Secondary | ICD-10-CM | POA: Diagnosis not present

## 2019-07-26 DIAGNOSIS — U071 COVID-19: Principal | ICD-10-CM

## 2019-07-26 DIAGNOSIS — J9600 Acute respiratory failure, unspecified whether with hypoxia or hypercapnia: Secondary | ICD-10-CM

## 2019-07-26 DIAGNOSIS — N179 Acute kidney failure, unspecified: Secondary | ICD-10-CM | POA: Diagnosis not present

## 2019-07-26 DIAGNOSIS — Z4682 Encounter for fitting and adjustment of non-vascular catheter: Secondary | ICD-10-CM | POA: Diagnosis not present

## 2019-07-26 LAB — POCT I-STAT 7, (LYTES, BLD GAS, ICA,H+H)
Acid-Base Excess: 1 mmol/L (ref 0.0–2.0)
Acid-Base Excess: 2 mmol/L (ref 0.0–2.0)
Acid-Base Excess: 1 mmol/L (ref 0.0–2.0)
Bicarbonate: 26.8 mmol/L (ref 20.0–28.0)
Bicarbonate: 28.2 mmol/L — ABNORMAL HIGH (ref 20.0–28.0)
Bicarbonate: 27.8 mmol/L (ref 20.0–28.0)
Calcium, Ion: 1.12 mmol/L — ABNORMAL LOW (ref 1.15–1.40)
Calcium, Ion: 1.16 mmol/L (ref 1.15–1.40)
Calcium, Ion: 1.16 mmol/L (ref 1.15–1.40)
HCT: 34 % — ABNORMAL LOW (ref 39.0–52.0)
HCT: 33 % — ABNORMAL LOW (ref 39.0–52.0)
HCT: 34 % — ABNORMAL LOW (ref 39.0–52.0)
Hemoglobin: 11.6 g/dL — ABNORMAL LOW (ref 13.0–17.0)
Hemoglobin: 11.2 g/dL — ABNORMAL LOW (ref 13.0–17.0)
Hemoglobin: 11.6 g/dL — ABNORMAL LOW (ref 13.0–17.0)
O2 Saturation: 94 %
O2 Saturation: 99 %
O2 Saturation: 100 %
Patient temperature: 94.1
Patient temperature: 34.4
Patient temperature: 36
Potassium: 4 mmol/L (ref 3.5–5.1)
Potassium: 4.1 mmol/L (ref 3.5–5.1)
Potassium: 4.6 mmol/L (ref 3.5–5.1)
Sodium: 136 mmol/L (ref 135–145)
Sodium: 138 mmol/L (ref 135–145)
Sodium: 141 mmol/L (ref 135–145)
TCO2: 28 mmol/L (ref 22–32)
TCO2: 30 mmol/L (ref 22–32)
TCO2: 29 mmol/L (ref 22–32)
pCO2 arterial: 40.8 mmHg (ref 32.0–48.0)
pCO2 arterial: 44.9 mmHg (ref 32.0–48.0)
pCO2 arterial: 49.5 mmHg — ABNORMAL HIGH (ref 32.0–48.0)
pH, Arterial: 7.414 (ref 7.350–7.450)
pH, Arterial: 7.394 (ref 7.350–7.450)
pH, Arterial: 7.353 (ref 7.350–7.450)
pO2, Arterial: 63 mmHg — ABNORMAL LOW (ref 83.0–108.0)
pO2, Arterial: 121 mmHg — ABNORMAL HIGH (ref 83.0–108.0)
pO2, Arterial: 275 mmHg — ABNORMAL HIGH (ref 83.0–108.0)

## 2019-07-26 LAB — MAGNESIUM: Magnesium: 2.7 mg/dL — ABNORMAL HIGH (ref 1.7–2.4)

## 2019-07-26 LAB — GLUCOSE, CAPILLARY
Glucose-Capillary: 171 mg/dL — ABNORMAL HIGH (ref 70–99)
Glucose-Capillary: 158 mg/dL — ABNORMAL HIGH (ref 70–99)
Glucose-Capillary: 125 mg/dL — ABNORMAL HIGH (ref 70–99)
Glucose-Capillary: 138 mg/dL — ABNORMAL HIGH (ref 70–99)
Glucose-Capillary: 148 mg/dL — ABNORMAL HIGH (ref 70–99)

## 2019-07-26 LAB — CBC WITH DIFFERENTIAL/PLATELET
Abs Immature Granulocytes: 0.27 10*3/uL — ABNORMAL HIGH (ref 0.00–0.07)
Basophils Absolute: 0 10*3/uL (ref 0.0–0.1)
Basophils Relative: 0 %
Eosinophils Absolute: 0 10*3/uL (ref 0.0–0.5)
Eosinophils Relative: 0 %
HCT: 36 % — ABNORMAL LOW (ref 39.0–52.0)
Hemoglobin: 11.7 g/dL — ABNORMAL LOW (ref 13.0–17.0)
Immature Granulocytes: 2 %
Lymphocytes Relative: 4 %
Lymphs Abs: 0.5 10*3/uL — ABNORMAL LOW (ref 0.7–4.0)
MCH: 30.3 pg (ref 26.0–34.0)
MCHC: 32.5 g/dL (ref 30.0–36.0)
MCV: 93.3 fL (ref 80.0–100.0)
Monocytes Absolute: 0.3 10*3/uL (ref 0.1–1.0)
Monocytes Relative: 2 %
Neutro Abs: 12.9 10*3/uL — ABNORMAL HIGH (ref 1.7–7.7)
Neutrophils Relative %: 92 %
Platelets: 300 10*3/uL (ref 150–400)
RBC: 3.86 MIL/uL — ABNORMAL LOW (ref 4.22–5.81)
RDW: 13.9 % (ref 11.5–15.5)
WBC: 14 10*3/uL — ABNORMAL HIGH (ref 4.0–10.5)
nRBC: 0.1 % (ref 0.0–0.2)

## 2019-07-26 LAB — COMPREHENSIVE METABOLIC PANEL
ALT: 32 U/L (ref 0–44)
AST: 47 U/L — ABNORMAL HIGH (ref 15–41)
Albumin: 2.5 g/dL — ABNORMAL LOW (ref 3.5–5.0)
Alkaline Phosphatase: 49 U/L (ref 38–126)
Anion gap: 9 (ref 5–15)
BUN: 31 mg/dL — ABNORMAL HIGH (ref 8–23)
CO2: 27 mmol/L (ref 22–32)
Calcium: 8 mg/dL — ABNORMAL LOW (ref 8.9–10.3)
Chloride: 102 mmol/L (ref 98–111)
Creatinine, Ser: 1.35 mg/dL — ABNORMAL HIGH (ref 0.61–1.24)
GFR calc Af Amer: >60 mL/min (ref >60)
GFR calc non Af Amer: 54 mL/min — ABNORMAL LOW (ref >60)
Glucose, Bld: 188 mg/dL — ABNORMAL HIGH (ref 70–99)
Potassium: 4.3 mmol/L (ref 3.5–5.1)
Sodium: 138 mmol/L (ref 135–145)
Total Bilirubin: 0.8 mg/dL (ref 0.3–1.2)
Total Protein: 6.3 g/dL — ABNORMAL LOW (ref 6.5–8.1)

## 2019-07-26 LAB — D-DIMER, QUANTITATIVE: D-Dimer, Quant: >20 ug/mL-FEU — ABNORMAL HIGH (ref 0.00–0.50)

## 2019-07-26 LAB — ABO/RH: ABO/RH(D): O POS

## 2019-07-26 LAB — HEMOGLOBIN A1C
Hgb A1c MFr Bld: 6.1 % — ABNORMAL HIGH (ref 4.8–5.6)
Mean Plasma Glucose: 128.37 mg/dL

## 2019-07-26 LAB — PHOSPHORUS: Phosphorus: 4.1 mg/dL (ref 2.5–4.6)

## 2019-07-26 LAB — C-REACTIVE PROTEIN: CRP: 18 mg/dL — ABNORMAL HIGH (ref <1.0)

## 2019-07-26 LAB — MRSA PCR SCREENING: MRSA by PCR: NEGATIVE

## 2019-07-26 LAB — FERRITIN: Ferritin: 1251 ng/mL — ABNORMAL HIGH (ref 24–336)

## 2019-07-26 LAB — HIV ANTIBODY (ROUTINE TESTING W REFLEX): HIV Screen 4th Generation wRfx: NONREACTIVE

## 2019-07-26 MED ORDER — SODIUM CHLORIDE 0.9 % IV SOLN
100.0000 mg | INTRAVENOUS | Status: AC
  Administered 2019-07-26 – 2019-07-29 (×4): 100 mg via INTRAVENOUS
  Filled 2019-07-26 (×4): qty 20

## 2019-07-26 MED ORDER — VITAL HIGH PROTEIN PO LIQD
1000.0000 mL | ORAL | Status: DC
  Administered 2019-07-26: 17:00:00 1000 mL

## 2019-07-26 MED ORDER — PRO-STAT SUGAR FREE PO LIQD
30.0000 mL | Freq: Two times a day (BID) | ORAL | Status: DC
  Administered 2019-07-26 – 2019-07-27 (×2): 30 mL
  Filled 2019-07-26 (×2): qty 30

## 2019-07-26 MED ORDER — CHLORHEXIDINE GLUCONATE CLOTH 2 % EX PADS
6.0000 | MEDICATED_PAD | Freq: Every day | CUTANEOUS | Status: DC
  Administered 2019-07-26 – 2019-09-12 (×50): 6 via TOPICAL

## 2019-07-26 MED ORDER — SODIUM CHLORIDE 0.9% IV SOLUTION: Freq: Once | INTRAVENOUS | Status: DC

## 2019-07-26 MED ORDER — SODIUM CHLORIDE 0.9 % IV SOLN
200.0000 mg | Freq: Once | INTRAVENOUS | Status: AC
  Administered 2019-07-26: 200 mg via INTRAVENOUS
  Filled 2019-07-26: qty 40

## 2019-07-26 MED ORDER — ROCURONIUM BROMIDE 50 MG/5ML IV SOLN
0.6000 mg/kg | INTRAVENOUS | Status: DC | PRN
  Filled 2019-07-26: qty 5.76

## 2019-07-26 MED ORDER — ACETAMINOPHEN 325 MG PO TABS
650.0000 mg | ORAL_TABLET | Freq: Four times a day (QID) | ORAL | Status: DC | PRN
  Administered 2019-07-31 – 2019-08-05 (×4): 650 mg via ORAL
  Filled 2019-07-26 (×4): qty 2

## 2019-07-26 MED ORDER — INSULIN ASPART 100 UNIT/ML ~~LOC~~ SOLN
0.0000 [IU] | SUBCUTANEOUS | Status: DC
  Administered 2019-07-26: 2 [IU] via SUBCUTANEOUS
  Administered 2019-07-26: 1 [IU] via SUBCUTANEOUS
  Administered 2019-07-27: 2 [IU] via SUBCUTANEOUS
  Administered 2019-07-27: 1 [IU] via SUBCUTANEOUS
  Administered 2019-07-27 (×2): 2 [IU] via SUBCUTANEOUS
  Administered 2019-07-27: 1 [IU] via SUBCUTANEOUS
  Administered 2019-07-27 – 2019-07-28 (×5): 2 [IU] via SUBCUTANEOUS
  Administered 2019-07-28 – 2019-07-29 (×4): 1 [IU] via SUBCUTANEOUS
  Administered 2019-07-29: 2 [IU] via SUBCUTANEOUS
  Administered 2019-07-29: 18:00:00 3 [IU] via SUBCUTANEOUS
  Administered 2019-07-30: 1 [IU] via SUBCUTANEOUS
  Administered 2019-07-30 – 2019-08-01 (×12): 2 [IU] via SUBCUTANEOUS
  Administered 2019-08-01: 20:00:00 3 [IU] via SUBCUTANEOUS
  Administered 2019-08-01 (×2): 2 [IU] via SUBCUTANEOUS
  Administered 2019-08-01: 5 [IU] via SUBCUTANEOUS
  Administered 2019-08-02 – 2019-08-03 (×8): 2 [IU] via SUBCUTANEOUS
  Administered 2019-08-03: 20:00:00 1 [IU] via SUBCUTANEOUS
  Administered 2019-08-03 (×3): 2 [IU] via SUBCUTANEOUS
  Administered 2019-08-04: 1 [IU] via SUBCUTANEOUS
  Administered 2019-08-04: 2 [IU] via SUBCUTANEOUS
  Administered 2019-08-04: 3 [IU] via SUBCUTANEOUS
  Administered 2019-08-04 (×3): 2 [IU] via SUBCUTANEOUS
  Administered 2019-08-05: 3 [IU] via SUBCUTANEOUS
  Administered 2019-08-05: 08:00:00 2 [IU] via SUBCUTANEOUS
  Administered 2019-08-05: 3 [IU] via SUBCUTANEOUS
  Administered 2019-08-05 (×3): 2 [IU] via SUBCUTANEOUS
  Administered 2019-08-06: 3 [IU] via SUBCUTANEOUS
  Administered 2019-08-06: 2 [IU] via SUBCUTANEOUS
  Administered 2019-08-06: 3 [IU] via SUBCUTANEOUS
  Administered 2019-08-06 (×2): 2 [IU] via SUBCUTANEOUS
  Administered 2019-08-06: 21:00:00 5 [IU] via SUBCUTANEOUS
  Administered 2019-08-07: 05:00:00 9 [IU] via SUBCUTANEOUS
  Administered 2019-08-07 (×2): 3 [IU] via SUBCUTANEOUS
  Administered 2019-08-07: 5 [IU] via SUBCUTANEOUS
  Administered 2019-08-07 (×2): 2 [IU] via SUBCUTANEOUS
  Administered 2019-08-07: 5 [IU] via SUBCUTANEOUS
  Administered 2019-08-08: 2 [IU] via SUBCUTANEOUS
  Administered 2019-08-08: 09:00:00 1 [IU] via SUBCUTANEOUS
  Administered 2019-08-08: 3 [IU] via SUBCUTANEOUS
  Administered 2019-08-08 – 2019-08-09 (×5): 2 [IU] via SUBCUTANEOUS
  Administered 2019-08-09 (×2): 1 [IU] via SUBCUTANEOUS
  Administered 2019-08-09 – 2019-08-10 (×3): 2 [IU] via SUBCUTANEOUS
  Administered 2019-08-10 (×2): 3 [IU] via SUBCUTANEOUS
  Administered 2019-08-10 (×2): 2 [IU] via SUBCUTANEOUS
  Administered 2019-08-10: 21:00:00 1 [IU] via SUBCUTANEOUS
  Administered 2019-08-11 (×3): 2 [IU] via SUBCUTANEOUS
  Administered 2019-08-11: 05:00:00 1 [IU] via SUBCUTANEOUS
  Administered 2019-08-11 – 2019-08-12 (×2): 2 [IU] via SUBCUTANEOUS
  Administered 2019-08-12: 3 [IU] via SUBCUTANEOUS
  Administered 2019-08-12 – 2019-08-13 (×5): 2 [IU] via SUBCUTANEOUS
  Administered 2019-08-14: 3 [IU] via SUBCUTANEOUS
  Administered 2019-08-14: 05:00:00 5 [IU] via SUBCUTANEOUS
  Administered 2019-08-14 (×3): 2 [IU] via SUBCUTANEOUS
  Administered 2019-08-14: 3 [IU] via SUBCUTANEOUS
  Administered 2019-08-15 (×2): 2 [IU] via SUBCUTANEOUS
  Administered 2019-08-15: 1 [IU] via SUBCUTANEOUS
  Administered 2019-08-15 – 2019-08-16 (×4): 2 [IU] via SUBCUTANEOUS
  Administered 2019-08-16: 3 [IU] via SUBCUTANEOUS
  Administered 2019-08-16 (×2): 2 [IU] via SUBCUTANEOUS
  Administered 2019-08-16: 12:00:00 1 [IU] via SUBCUTANEOUS
  Administered 2019-08-17 (×2): 2 [IU] via SUBCUTANEOUS
  Administered 2019-08-17: 3 [IU] via SUBCUTANEOUS
  Administered 2019-08-17 (×3): 2 [IU] via SUBCUTANEOUS
  Administered 2019-08-18: 3 [IU] via SUBCUTANEOUS
  Administered 2019-08-18 (×2): 2 [IU] via SUBCUTANEOUS
  Administered 2019-08-18: 3 [IU] via SUBCUTANEOUS
  Administered 2019-08-18: 2 [IU] via SUBCUTANEOUS
  Administered 2019-08-18 (×2): 3 [IU] via SUBCUTANEOUS
  Administered 2019-08-19: 1 [IU] via SUBCUTANEOUS
  Administered 2019-08-19 (×2): 2 [IU] via SUBCUTANEOUS
  Administered 2019-08-19: 1 [IU] via SUBCUTANEOUS
  Administered 2019-08-19: 04:00:00 3 [IU] via SUBCUTANEOUS
  Administered 2019-08-20: 2 [IU] via SUBCUTANEOUS
  Administered 2019-08-20: 1 [IU] via SUBCUTANEOUS
  Administered 2019-08-20: 20:00:00 2 [IU] via SUBCUTANEOUS
  Administered 2019-08-20 – 2019-08-22 (×10): 1 [IU] via SUBCUTANEOUS
  Administered 2019-08-22: 04:00:00 0 [IU] via SUBCUTANEOUS
  Administered 2019-08-23 – 2019-08-28 (×19): 1 [IU] via SUBCUTANEOUS
  Administered 2019-08-28 – 2019-08-29 (×2): 2 [IU] via SUBCUTANEOUS
  Administered 2019-08-29 – 2019-08-31 (×3): 1 [IU] via SUBCUTANEOUS

## 2019-07-26 MED ORDER — VECURONIUM BROMIDE 10 MG IV SOLR
INTRAVENOUS | Status: AC
  Administered 2019-07-26: 9.3 mg
  Filled 2019-07-26: qty 10

## 2019-07-26 MED ORDER — ENOXAPARIN SODIUM 40 MG/0.4ML ~~LOC~~ SOLN: 40.0000 mg | SUBCUTANEOUS | Status: DC

## 2019-07-26 MED ORDER — FENTANYL CITRATE (PF) 100 MCG/2ML IJ SOLN: 25.0000 ug | Freq: Once | INTRAMUSCULAR | Status: DC

## 2019-07-26 MED ORDER — TOCILIZUMAB 400 MG/20ML IV SOLN
800.0000 mg | Freq: Once | INTRAVENOUS | Status: AC
  Administered 2019-07-26: 800 mg via INTRAVENOUS
  Filled 2019-07-26: qty 40

## 2019-07-26 MED ORDER — FAMOTIDINE IN NACL 20-0.9 MG/50ML-% IV SOLN
20.0000 mg | Freq: Two times a day (BID) | INTRAVENOUS | Status: DC
  Administered 2019-07-26 – 2019-07-27 (×4): 20 mg via INTRAVENOUS
  Filled 2019-07-26 (×4): qty 50

## 2019-07-26 MED ORDER — ORAL CARE MOUTH RINSE
15.0000 mL | OROMUCOSAL | Status: DC
  Administered 2019-07-26 – 2019-09-25 (×538): 15 mL via OROMUCOSAL

## 2019-07-26 MED ORDER — NOREPINEPHRINE 4 MG/250ML-% IV SOLN
0.0000 ug/min | INTRAVENOUS | Status: DC
  Administered 2019-07-26: 2 ug/min via INTRAVENOUS
  Administered 2019-07-27: 6 ug/min via INTRAVENOUS
  Administered 2019-07-28 – 2019-08-05 (×2): 2 ug/min via INTRAVENOUS
  Administered 2019-08-06: 30 ug/min via INTRAVENOUS
  Filled 2019-07-26 (×3): qty 250
  Filled 2019-07-26: qty 500
  Filled 2019-07-26: qty 250

## 2019-07-26 MED ORDER — DEXAMETHASONE SODIUM PHOSPHATE 10 MG/ML IJ SOLN
6.0000 mg | INTRAMUSCULAR | Status: AC
  Administered 2019-07-26 – 2019-08-04 (×10): 6 mg via INTRAVENOUS
  Filled 2019-07-26 (×10): qty 1

## 2019-07-26 MED ORDER — PRO-STAT SUGAR FREE PO LIQD: 30.0000 mL | Freq: Three times a day (TID) | ORAL | Status: DC

## 2019-07-26 MED ORDER — VITAL 1.5 CAL PO LIQD
1000.0000 mL | ORAL | Status: DC
  Filled 2019-07-26 (×2): qty 1000

## 2019-07-26 MED ORDER — SODIUM CHLORIDE 0.9 % IV SOLN
INTRAVENOUS | Status: DC
  Administered 2019-07-26: 20:00:00 via INTRAVENOUS

## 2019-07-26 MED ORDER — VECURONIUM BROMIDE 10 MG IV SOLR
0.1000 mg/kg | INTRAVENOUS | Status: DC | PRN
  Filled 2019-07-26 (×2): qty 10

## 2019-07-26 MED ORDER — MIDAZOLAM 50MG/50ML (1MG/ML) PREMIX INFUSION
0.5000 mg/h | INTRAVENOUS | Status: DC
  Administered 2019-07-26: 03:00:00 0.5 mg/h via INTRAVENOUS
  Filled 2019-07-26: qty 50

## 2019-07-26 MED ORDER — SODIUM CHLORIDE 0.9 % IV SOLN
INTRAVENOUS | Status: DC | PRN
  Administered 2019-07-31: 21:00:00 via INTRA_ARTERIAL

## 2019-07-26 MED ORDER — CHLORHEXIDINE GLUCONATE 0.12% ORAL RINSE (MEDLINE KIT)
15.0000 mL | Freq: Two times a day (BID) | OROMUCOSAL | Status: DC
  Administered 2019-07-26 – 2019-08-05 (×22): 15 mL via OROMUCOSAL

## 2019-07-26 MED ORDER — MIDAZOLAM 50MG/50ML (1MG/ML) PREMIX INFUSION
2.0000 mg/h | INTRAVENOUS | Status: DC
  Administered 2019-07-26 – 2019-07-27 (×2): 2 mg/h via INTRAVENOUS
  Administered 2019-07-27: 4 mg/h via INTRAVENOUS
  Administered 2019-07-27: 3 mg/h via INTRAVENOUS
  Administered 2019-07-28 (×2): 4 mg/h via INTRAVENOUS
  Filled 2019-07-26 (×5): qty 50

## 2019-07-26 MED ORDER — ARTIFICIAL TEARS OPHTHALMIC OINT
1.0000 "application " | TOPICAL_OINTMENT | Freq: Three times a day (TID) | OPHTHALMIC | Status: DC
  Administered 2019-07-26 – 2019-07-28 (×6): 1 via OPHTHALMIC
  Filled 2019-07-26: qty 3.5

## 2019-07-26 MED ORDER — POTASSIUM CHLORIDE IN NACL 20-0.9 MEQ/L-% IV SOLN
INTRAVENOUS | Status: DC
  Administered 2019-07-26: 03:00:00 via INTRAVENOUS
  Filled 2019-07-26: qty 1000

## 2019-07-26 MED ORDER — ENOXAPARIN SODIUM 60 MG/0.6ML ~~LOC~~ SOLN
50.0000 mg | Freq: Two times a day (BID) | SUBCUTANEOUS | Status: DC
  Administered 2019-07-26 – 2019-07-28 (×6): 50 mg via SUBCUTANEOUS
  Filled 2019-07-26 (×6): qty 0.6

## 2019-07-26 NOTE — Progress Notes (Signed)
Spoke with patients wife on phone to update. [name]

## 2019-07-26 NOTE — Progress Notes (Signed)
Assisted with proneing patient, ETT secured with cloth tape prior to maneuver.  Tolerated well, no complication.

## 2019-07-26 NOTE — Procedures (Signed)
Arterial Catheter Insertion Procedure Note Keric [name] [id number] November 20, 1950  Procedure: Insertion of Arterial Catheter  Indications: Blood pressure monitoring  Procedure Details Consent: Unable to obtain consent because of altered level of consciousness. Time Out: Verified patient identification, verified procedure, site/side was marked, verified correct patient position, special equipment/implants available, medications/allergies/relevent history reviewed, required imaging and test results available.  Performed  Maximum sterile technique was used including antiseptics, cap, gloves, gown, hand hygiene, mask and sheet. Skin prep: Chlorhexidine; local anesthetic administered 20 gauge catheter was inserted into right radial artery using the Seldinger technique. ULTRASOUND GUIDANCE USED: NO Evaluation Blood flow good; BP tracing good. Complications: No apparent complications.   [name], [name] 07/26/2019

## 2019-07-26 NOTE — Progress Notes (Signed)
Pt's head turned to right

## 2019-07-26 NOTE — H&P (Signed)
History and Physical    [name] QQI:[id number] DOB: [birth year] DOA: 07/25/2019  PCP: Kathyrn Drown, MD   Patient coming from: [facility]  I have personally briefly reviewed patient's old medical records in [place]  CC: SOB, positive COVID-19 HPI: Previously healthy 68 year old male with a history of reflux who presented to [facility] ER with shortness of breath and hypoxia.  Patient was diagnosed with COVID 19 on July 13 9019.  Patient's wife also has COVID-19 but is asymptomatic.  Over the last 1 to 2 days, the patient's shortness of breath is increased.  Patient has been suffering with anorexia.  Patient's wife was actually having a conversation via teleconference with the patient's PCP.  PCP is also otherwise physician.  He had actually called to check on how the wife was doing.  The wife had mentioned to the physician that the patient has been laying in bed.  When the physician listened to how the patient was speaking over the phone, it was noted the patient was tachypneic.  EMS was activated.  When EMS arrived, the patient's O2 saturations were apparently in the 40 percentile range.  Patient was placed on nonrebreather.  He was transferred to the ER.  In the ER, his oxygen saturations on 100% nonrebreather were only in the 80s.  Patient was confused.  Patient was emergently intubated.  Patient given IV steroids.  Initial chest x-ray demonstrates diffuse multifocal infiltrates.  CT scan of the chest was negative for PE.  ABG prior to leaving for [place] demonstrates a pH 7.41 PCO2 44 PO2 of 87 on FiO2 100%.  AA gradient calculates out to 571.  I personally called the patient's wife on the evening of July 25, 2019.  During that conversation we had discussed using IV steroids, IV remdesivir.  We also discussed off label use of Actemra and possibly confluence of plasma due to the patient's severe COVID-19 pneumonia.  She verbally agreed that the medical team  should try anything possible to make the patient better.  Confirm with the wife the patient is a full code.    ED Course: emergently intubated. Central line placed. Started on IV Decadron.  Review of Systems:  Review of Systems  Unable to perform ROS: Critical illness    Past Medical History:  Diagnosis Date   COVID-19    Heartburn     Past Surgical History:  Procedure Laterality Date   ACHILLES TENDON SURGERY Right 2012   COLONOSCOPY  2009   [facility]   ESOPHAGOGASTRODUODENOSCOPY N/A 12/25/2014   Procedure: ESOPHAGOGASTRODUODENOSCOPY (EGD);  Surgeon: Danie Binder, MD;  Location: [facility]O SUITE;  Service: Endoscopy;  Laterality: N/A;  830am   KNEE SURGERY Left 1980's   none     WRIST SURGERY Left 1970's   Social History  reports that he has never smoked. He has never used smokeless tobacco. He reports current alcohol use of about 2.0 standard drinks of alcohol per week. He reports that he does not use drugs.  No Known Allergies  Family History  Problem Relation Age of Onset   Colon cancer Neg Hx    Colon polyps Neg Hx     Prior to Admission medications   Medication Sig Start Date End Date Taking? Authorizing Provider  ondansetron (ZOFRAN ODT) 4 MG disintegrating tablet Take 1 tablet (4 mg total) by mouth every 8 (eight) hours as needed for nausea or vomiting. 07/21/19  Yes Kathyrn Drown, MD  Physical Exam: Vitals:   07/25/19 2330 07/25/19 2345 07/26/19 0000 07/26/19 0015  BP: (!) 89/51 92/63 98/65  100/66  Pulse: (!) 56 (!) 55 (!) 54 (!) 53  Resp: 18 20 20 20   Temp: (!) 97.2 F (36.2 C) (!) 97.2 F (36.2 C) (!) 97.2 F (36.2 C)   TempSrc:      SpO2: 91% 92% 94% 93%  Weight:      Height:        Physical Exam  Nursing note and vitals reviewed. Constitutional: He appears ill. No distress. He is sedated and intubated.  HENT:  Head: Normocephalic and atraumatic.  Eyes: Right eye exhibits no discharge. Left eye exhibits no discharge.  Neck: No JVD  present.  Cardiovascular: Normal rate.  Respiratory: He is intubated. He has decreased breath sounds. He has no wheezes. He has no rhonchi. He has rales in the right upper field, the right middle field, the right lower field, the left upper field, the left middle field and the left lower field.  Brown secretions in ETT  GI: He exhibits no distension.  Genitourinary:    Penis normal.   Musculoskeletal:        General: No deformity or edema.  Skin: Skin is warm and dry.  Right IJ CVL     Labs on Admission: I have personally reviewed following labs and imaging studies  COVID-19 Labs  Recent Labs    07/25/19 1656  DDIMER >20.00*  FERRITIN 1,417*  LDH 511*  CRP 19.2*    Lab Results  Component Value Date   SARSCOV2NAA Detected (A) 07/22/2019    ABG    Component Value Date/Time   PHART 7.414 07/25/2019 2051   PCO2ART 44.0 07/25/2019 2051   PO2ART 87.8 07/25/2019 2051   HCO3 26.8 07/25/2019 2051   O2SAT 95.4 07/25/2019 2051  on FiO2 100%  CBC: Recent Labs  Lab 07/25/19 1656  WBC 10.2  NEUTROABS 9.0*  HGB 13.2  HCT 40.1  MCV 92.4  PLT 564   Basic Metabolic Panel: Recent Labs  Lab 07/25/19 1656  NA 134*  K 3.9  CL 97*  CO2 24  GLUCOSE 131*  BUN 26*  CREATININE 1.51*  CALCIUM 8.4*   GFR: Estimated Creatinine Clearance: 55.9 mL/min (A) (by C-G formula based on SCr of 1.51 mg/dL (H)). Liver Function Tests: Recent Labs  Lab 07/25/19 1656  AST 52*  ALT 31  ALKPHOS 54  BILITOT 0.9  PROT 7.2  ALBUMIN 3.1*   No results for input(s): LIPASE, AMYLASE in the last 168 hours. No results for input(s): AMMONIA in the last 168 hours. Coagulation Profile: No results for input(s): INR, PROTIME in the last 168 hours. Cardiac Enzymes: No results for input(s): CKTOTAL, CKMB, CKMBINDEX, TROPONINI in the last 168 hours. BNP (last 3 results) No results for input(s): PROBNP in the last 8760 hours. HbA1C: No results for input(s): HGBA1C in the last 72  hours. CBG: No results for input(s): GLUCAP in the last 168 hours. Lipid Profile: Recent Labs    07/25/19 1656  TRIG 99   Thyroid Function Tests: No results for input(s): TSH, T4TOTAL, FREET4, T3FREE, THYROIDAB in the last 72 hours. Anemia Panel: Recent Labs    07/25/19 1656  FERRITIN 1,417*   Urine analysis:    Component Value Date/Time   COLORURINE YELLOW 07/25/2019 1713   APPEARANCEUR HAZY (A) 07/25/2019 1713   LABSPEC 1.027 07/25/2019 1713   PHURINE 5.0 07/25/2019 1713   GLUCOSEU NEGATIVE 07/25/2019 1713   HGBUR  MODERATE (A) 07/25/2019 1713   BILIRUBINUR NEGATIVE 07/25/2019 1713   KETONESUR NEGATIVE 07/25/2019 1713   PROTEINUR 100 (A) 07/25/2019 1713   NITRITE NEGATIVE 07/25/2019 1713   LEUKOCYTESUR NEGATIVE 07/25/2019 1713    Radiological Exams on Admission: I have personally reviewed images Ct Angio Chest Pe W Or Wo Contrast  Result Date: 07/25/2019 CLINICAL DATA:  Covid positive, increased work of breathing EXAM: CT ANGIOGRAPHY CHEST WITH CONTRAST TECHNIQUE: Multidetector CT imaging of the chest was performed using the standard protocol during bolus administration of intravenous contrast. Multiplanar CT image reconstructions and MIPs were obtained to evaluate the vascular anatomy. CONTRAST:  120mL OMNIPAQUE IOHEXOL 350 MG/ML SOLN COMPARISON:  Radiograph same day FINDINGS: Cardiovascular: There is a optimal opacification of the pulmonary arteries. There is no central,segmental, or subsegmental filling defects within the pulmonary arteries. There is mild cardiomegaly. There is normal three-vessel brachiocephalic anatomy without proximal stenosis. The thoracic aorta is normal in appearance. Mediastinum/Nodes: Scattered subcarinal and bilateral hilar lymph nodes are seen. Thyroid gland, trachea, and esophagus demonstrate no significant findings. Endotracheal tube tip is seen approximately 2 cm above the carina. Lungs/Pleura: Multifocal patchy airspace consolidation and  ground-glass opacities are seen throughout both predominantly within the periphery. There is peribronchial thickening seen in the posterior bilateral lower lungs. No pleural effusion or pneumothorax is seen. Upper Abdomen: There is a 1 cm low-density lesion seen within the right hepatic lobe. NG tube tip is seen within the stomach. Musculoskeletal: No chest wall abnormality. No acute or significant osseous findings. Review of the MIP images confirms the above findings. IMPRESSION: No central, segmental, or subsegmental pulmonary embolism. Multifocal patchy airspace consolidation and ground-glass opacities, which can be seen with acute atypical infection, in particular, multifocal viral pneumonia (including COVID-19) Mild cardiomegaly Electronically Signed   By: Prudencio Pair M.D.   On: 07/25/2019 21:07   Dg Chest Port 1 View  Result Date: 07/25/2019 CLINICAL DATA:  Check endotracheal tube repositioning EXAM: PORTABLE CHEST 1 VIEW COMPARISON:  07/25/2019, 5:23 p.m. FINDINGS: Endotracheal tube remains with tip above the thoracic inlet. Recommend repositioning and advancement. Esophagogastric tube with tip and side port below the diaphragm. Electrical lead versus vascular catheter projects over the right neck and chest, and appears to terminate in the vicinity of the superior cavoatrial junction. Unchanged diffuse, extensive bilateral heterogeneous opacity. Cardiomegaly. IMPRESSION: 1. Endotracheal tube remains with tip above the thoracic inlet. Recommend repositioning and advancement. Esophagogastric tube with tip and side port below the diaphragm. 2. Electrical lead versus vascular catheter has been placed in the interval and projects over the right neck and chest, and appears to terminate in the vicinity of the superior cavoatrial junction. 3. Unchanged diffuse, extensive bilateral heterogeneous opacity, consistent with multifocal infection, edema, and/or ARDS. These results will be called to the ordering  clinician or representative by the Radiologist Assistant, and communication documented in the PACS or zVision Dashboard. Electronically Signed   By: Eddie Candle M.D.   On: 07/25/2019 20:26   Dg Chest Port 1 View  Result Date: 07/25/2019 CLINICAL DATA:  Shortness of breath, post ETT EXAM: PORTABLE CHEST 1 VIEW COMPARISON:  None. FINDINGS: Endotracheal tube is positioned above the thoracic inlet. Esophagogastric tube with tip and side port below the diaphragm. There is extensive heterogeneous airspace opacity bilaterally. Cardiomegaly. IMPRESSION: 1. Endotracheal tube is positioned above the thoracic inlet. Recommend repositioning and advancement. 2.  Esophagogastric tube with tip and side port below the diaphragm. 3. There is extensive heterogeneous airspace opacity bilaterally,, consistent with multifocal  infection, edema, and/or ARDS. These results will be called to the ordering clinician or representative by the Radiologist Assistant, and communication documented in the PACS or zVision Dashboard. Electronically Signed   By: Eddie Candle M.D.   On: 07/25/2019 18:08    EKG: I have personally reviewed EKG:  NSR   Assessment/Plan Principal Problem:   Pneumonia due to COVID-19 virus Active Problems:   Acute respiratory failure due to COVID-19 [facility])   AKI (acute kidney injury) ([address])    Pneumonia due to COVID-19 virus Admit to [place] intensive care.  Continue IV Decadron.  Start IV remdesivir.  Will dose with IV Actemra.  Discussed this with telehealth/critical care who agrees.  We will repeat his chest x-ray to confirm ET tube placement.  Also repeat his ABG to confirm the large AA gradient.  If this continues to be greater than 300, will proceed with prone ventilation earlier.  Patient was transferred to [place] on IV Levophed at 1.5 mics per kilogram per minute.  This was not communicated to the [facility] team at the time of initial contact.  Patient is still awake on  propofol and fentanyl.  Will switch over to IV Versed for sedation and wean his propofol off.  Continue IV fentanyl.  60 minutes spent in critical care.  Acute respiratory failure due to COVID-19 [facility]) We use our genetic protocol for ventilation.  Targeting 4 to 6 cc/kg/min.  Keeping his peak pressures less than 30.  Repeat his ABG.  If is a great is greater than 300, will proceed with prone ventilation.  Check chest x-ray to confirm ET tube placement.  AKI (acute kidney injury) [facility]) Patient had some acute kidney injury on his initial ER lab work.  Monitor his renal function.   DVT prophylaxis: Lovenox Code Status: Full Code Family Communication: discussed with wife Aston Lieske via telephone on evening of 07-25-2019  Disposition Plan: to be determined  Consults called: discussed with telehealth/critical care. Dr. Genevive Bi.  Admission status: Inpatient, ICU.   Kristopher Oppenheim, DO [facility] 07/26/2019, 2:29 AM

## 2019-07-26 NOTE — Progress Notes (Signed)
Patient placed back in supine position with assistance of nursing staff with no apparent complications.

## 2019-07-26 NOTE — Progress Notes (Signed)
Pharmacy Brief Note   O:  ALT: 31  CXR: extensive heterogeneous airspace opacity bilaterally,, consistent with multifocal infection, edema, and/or ARDS.   SpO2: Pt intubated now    A/P:  Patient meets requirements for remdesivir therapy.  Will start  remdesivir 200 mg IV x 1  followed by 100 mg IV daily x 4 days.  Monitor ALT  Royetta Asal, PharmD, BCPS 07/26/2019 1:30 AM

## 2019-07-26 NOTE — ED Notes (Signed)
[facility] arrived. 

## 2019-07-26 NOTE — ED Notes (Signed)
New bottle of proprofol given to [facility] for transport

## 2019-07-26 NOTE — Assessment & Plan Note (Addendum)
Admit to [place] intensive care.  Continue IV Decadron.  Start IV remdesivir.  Will dose with IV Actemra.  Discussed this with telehealth/critical care who agrees.  We will repeat his chest x-ray to confirm ET tube placement.  Also repeat his ABG to confirm the large AA gradient.  If this continues to be greater than 300, will proceed with prone ventilation earlier.  Patient was transferred to [place] on IV Levophed at 1.5 mics per kilogram per minute.  This was not communicated to the [facility] team at the time of initial contact.  Patient is still awake on propofol and fentanyl.  Will switch over to IV Versed for sedation and wean his propofol off.  Continue IV fentanyl.  60 minutes spent in critical care.

## 2019-07-26 NOTE — Research (Signed)
Spoke with spouse of patient about COVID PACT trial, explained the study and answered her questions. I have emailed her a copy of consent for her to review to [email or web address] Will contact her either Sunday or Monday to see if she would like him to participate.

## 2019-07-26 NOTE — Progress Notes (Signed)
Advanced og tube ,abdomen 1 view obtained for verification. Ellamae Sia

## 2019-07-26 NOTE — Progress Notes (Signed)
eLink Physician-Brief Progress Note Patient Name: [name] DOB: 10-04-1951 MRN: [id number]   Date of Service  07/26/2019  HPI/Events of Note  Vecuronium pulled instead of rocuronium  eICU Interventions  Will change order for NMB to vecuronium instead     Intervention Category Major Interventions: Other:  Judd Lien 07/26/2019, 5:38 AM

## 2019-07-26 NOTE — Progress Notes (Signed)
NAME:  [name], MRN:  [id number], DOB:  May 22, 1951, LOS: 1 ADMISSION DATE:  07/25/2019, CONSULTATION DATE:  10/3 REFERRING MD:  Bridgett Larsson, CHIEF COMPLAINT:  Dyspnea   Brief History   68 y/o male admitted on 10/3 from the [facility] ED with ARDS from COVID pneumonia leading to need for mechanical ventilation.    History of present illness   This is a 68 y/o male who was admitted overnight from [facility] after he presented confused, dyspneic, and profoundly hypoxemic.  He was intubated, a CVL was placed and then sent to [facility] for further evaluation and management.  He received remdesivir and actemra overnight.  He was placed in the prone position overnight.   Past Medical History  COVID 19 Heartburn  Significant Hospital Events   10/3 admission  Consults:  PCCM  Procedures:  10/2 ETT>  10/2 R IJ CVL >   Significant Diagnostic Tests:  10/2 CT angiogram > personally reviewed, extensive bilateral airspace disease predominantly posterior  Micro Data:  9/20 SARS COV 2 > POSITIVE 10/2 blood >    Antimicrobials:  10/3 remdesivir  10/3 actemra 10/3 decadron   Interim history/subjective:  As above  Objective   Blood pressure 113/72, pulse (!) 47, temperature (!) 95.4 F (35.2 C), temperature source Core (Comment), resp. rate 20, height 5\' 10"  (1.778 m), weight 92.8 kg, SpO2 100 %.    Vent Mode: PRVC FiO2 (%):  [100 %] 100 % Set Rate:  [20 bmp] 20 bmp Vt Set:  [580 mL] 580 mL PEEP:  [8 cmH20] 8 cmH20 Plateau Pressure:  [21 cmH20-24 cmH20] 24 cmH20   Intake/Output Summary (Last 24 hours) at 07/26/2019 0750 Last data filed at 07/26/2019 8366 Gross per 24 hour  Intake 1088.55 ml  Output 400 ml  Net 688.55 ml   Filed Weights   07/25/19 1635 07/26/19 0446  Weight: 96 kg 92.8 kg    Examination:  General:  In bed on vent, prone position HENT: NCAT ETT in place PULM: Crackles bases B, vent supported breathing CV: Difficult to examine due to prone position GI: BS+, again, difficult  to examine due to prone position MSK: normal bulk and tone Neuro: sedated on vent   Resolved Hospital Problem list     Assessment & Plan:  Severe ARDS due to COVID 19 pneumonia Decadron 10 days Remdesivir 5 days Discuss convalescent plasma with spouse Continue mechanical ventilation per ARDS protocol Target TVol 6-8cc/kgIBW Target Plateau Pressure < 30cm H20 Target driving pressure less than 15 cm of water Target PaO2 55-65: titrate PEEP/FiO2 per protocol As long as PaO2 to FiO2 ratio is less than 1:150 position in prone position for 16 hours a day Check CVP daily if CVL in place Target CVP less than 4, diurese as necessary Ventilator associated pneumonia prevention protocol 10/3 plan: prone position, consider convalescent plasma, consented wife for COVID PACT trial investigating appropriate dose of anticoagulation for patients with COVID in the ICU  Need for sedation/mechanical ventilation Intermittent paralytic protocol RASS goal -4 to -5 Fentanyl drip Versed drip   Best practice:  Diet: tube feeding, start today Pain/Anxiety/Delirium protocol (if indicated): as above VAP protocol (if indicated): yes DVT prophylaxis: as above GI prophylaxis: famotidine Glucose control: SSI Mobility: bed rest Code Status: full Family Communication: I called his wife 10/3 and updated her Disposition: remain in ICU  Labs   CBC: Recent Labs  Lab 07/25/19 1656 07/26/19 0328  WBC 10.2  --   NEUTROABS 9.0*  --   HGB  13.2 11.6*  HCT 40.1 34.0*  MCV 92.4  --   PLT 377  --     Basic Metabolic Panel: Recent Labs  Lab 07/25/19 1656 07/26/19 0328 07/26/19 0425  NA 134* 136 138  K 3.9 4.0 4.3  CL 97*  --  102  CO2 24  --  27  GLUCOSE 131*  --  188*  BUN 26*  --  31*  CREATININE 1.51*  --  1.35*  CALCIUM 8.4*  --  8.0*  MG  --   --  2.7*  PHOS  --   --  4.1   GFR: Estimated Creatinine Clearance: 61.6 mL/min (A) (by C-G formula based on SCr of 1.35 mg/dL (H)). Recent Labs   Lab 07/25/19 1656 07/25/19 2005  PROCALCITON 3.62  --   WBC 10.2  --   LATICACIDVEN 2.2* 1.2    Liver Function Tests: Recent Labs  Lab 07/25/19 1656 07/26/19 0425  AST 52* 47*  ALT 31 32  ALKPHOS 54 49  BILITOT 0.9 0.8  PROT 7.2 6.3*  ALBUMIN 3.1* 2.5*   No results for input(s): LIPASE, AMYLASE in the last 168 hours. No results for input(s): AMMONIA in the last 168 hours.  ABG    Component Value Date/Time   PHART 7.414 07/26/2019 0328   PCO2ART 40.8 07/26/2019 0328   PO2ART 63.0 (L) 07/26/2019 0328   HCO3 26.8 07/26/2019 0328   TCO2 28 07/26/2019 0328   O2SAT 94.0 07/26/2019 0328     Coagulation Profile: No results for input(s): INR, PROTIME in the last 168 hours.  Cardiac Enzymes: No results for input(s): CKTOTAL, CKMB, CKMBINDEX, TROPONINI in the last 168 hours.  HbA1C: No results found for: HGBA1C  CBG: Recent Labs  Lab 07/26/19 0332  GLUCAP 171*    Review of Systems:   Cannot obtain due to intubation  Past Medical History  Cannot obtain due to intubation  Surgical History   Cannot obtain due to intubation   Social History  Cannot obtain due to intubation  Family History   Cannot obtain due to intubation  Allergies No Known Allergies   Home Medications  Prior to Admission medications   Medication Sig Start Date End Date Taking? Authorizing Provider  ondansetron (ZOFRAN ODT) 4 MG disintegrating tablet Take 1 tablet (4 mg total) by mouth every 8 (eight) hours as needed for nausea or vomiting. 07/21/19  Yes Kathyrn Drown, MD     Critical care time: 45 minutes     Roselie Awkward, MD [place] [facility] Pager: [phone] Cell: [phone] If no response, call [phone]

## 2019-07-26 NOTE — Progress Notes (Addendum)
PROGRESS NOTE    [name]  SWF:[id number] DOB: 06-13-1951 DOA: 07/25/2019 PCP: Kathyrn Drown, MD   Brief Narrative:  68 year old BM PMHx GERD, BPH   Presented to [facility] ER with shortness of breath and hypoxia.  Patient was diagnosed with COVID 19 on July 13 9019.  Patient's wife also has COVID-19 but is asymptomatic.  Over the last 1 to 2 days, the patient's shortness of breath is increased.  Patient has been suffering with anorexia.  Patient's wife was actually having a conversation via teleconference with the patient's PCP.  PCP is also otherwise physician.  He had actually called to check on how the wife was doing.  The wife had mentioned to the physician that the patient has been laying in bed.  When the physician listened to how the patient was speaking over the phone, it was noted the patient was tachypneic.  EMS was activated.  When EMS arrived, the patient's O2 saturations were apparently in the 40 percentile range.  Patient was placed on nonrebreather.  He was transferred to the ER.  In the ER, his oxygen saturations on 100% nonrebreather were only in the 80s.  Patient was confused.  Patient was emergently intubated.  Patient given IV steroids.  Initial chest x-ray demonstrates diffuse multifocal infiltrates.  CT scan of the chest was negative for PE.  ABG prior to leaving for [place] demonstrates a pH 7.41 PCO2 44 PO2 of 87 on FiO2 100%.  AA gradient calculates out to 571.  I personally called the patient's wife on the evening of July 25, 2019.  During that conversation we had discussed using IV steroids, IV remdesivir.  We also discussed off label use of Actemra and possibly confluence of plasma due to the patient's severe COVID-19 pneumonia.  She verbally agreed that the medical team should try anything possible to make the patient better.  Confirm with the wife the patient is a full code.    ED Course: emergently intubated. Central line placed. Started on  IV Decadron.    Subjective: 10/3 patient prone and sedated, appears comfortable.  Last 24 hours T-max 39.5 C   Assessment & Plan:   Principal Problem:   Pneumonia due to COVID-19 virus Active Problems:   BPH (benign prostatic hyperplasia)   Acute respiratory failure due to COVID-19 (HCC)   AKI (acute kidney injury) ([place])  COVID pneumonia/acute respiratory stress with hypoxia - Continue Decadron - Continue Remdesivir per pharmacy protocol - 10/2 Actemra x1 dose - 10/3 counseled patient wife on CCP over phone and she has agreed to allow [place] to administer CCP.  Awaiting paperwork - Continue prone patient> 16 hours/day if patient tolerates.  If not prone patient for 2 to 3 hours every shift. Recent Labs  Lab 07/25/19 1656 07/26/19 0425  CRP 19.2* 18.0*   Recent Labs  Lab 07/25/19 1656 07/26/19 0425  DDIMER >20.00* >20.00*  - Titrate patient's vent settings to maintain SPO2 88 to 93%, if possible.  Will use ARDS lung protective protocol (6 to 8 cc/kg/min). - Attempt to maintain plateau pressure<30 - Attempt to maintain driving pressure 15  Acute kidney injury - Avoid nephrotoxic medication Recent Labs  Lab 07/25/19 1656 07/26/19 0425  CREATININE 1.51* 1.35*   BPH -Not on home meds.  But be cognizant of urinary retention  Goals of care - CCP use; consult Mrs. Allerton that CCP is not improving therapy for COVID-19 though available data shows it may have benefit with high titer plasma  is given.  Is unclear if it is effective other patients and with use of low titer plasma.  Risk and benefits of its use were explained to Mrs. Nunziato and she agreed to treatment..  All appropriate IRB consent paperwork has been signed and is in patient's chart.   DVT prophylaxis: Lovenox (ICU dose) Code Status: Full Family Communication: 10/3 Dr. Kathalene Frames and I both spoke with wife counseled her on the plan of care answered all questions.  Obtain verbal consent over the phone for the  use of CCP. Disposition Plan: TBD   Consultants:  PCCM   Procedures/Significant Events:  10/2 PCXR;-Endotracheal tube remains with tip above the thoracic inlet. Recommend repositioning and advancement. Esophagogastric tube with tip and side port below the diaphragm. - Unchanged diffuse, extensive bilateral heterogeneous opacity, consistent with multifocal infection, edema, and/or ARDS. 10/3 PCXR; slightly improved extensive bilateral heterogeneous opacifications (my read)      I have personally reviewed and interpreted all radiology studies and my findings are as above.  VENTILATOR SETTINGS: Vent mode; PRVC Vt Set; 580 Set rate; 20 FiO2; 100% I time; 0.9 PEEP; 8   Cultures 9/29 Novel coronavirus positive 10/2 blood LEFT antecubital NGTD 10/2 blood hand NGTD 10/3 MRSA by PCR negative     Antimicrobials: Anti-infectives (From admission, onward)   Start     Stop   07/26/19 1600  remdesivir 100 mg in sodium chloride 0.9 % 250 mL IVPB     07/30/19 1559   07/26/19 0215  remdesivir 200 mg in sodium chloride 0.9 % 250 mL IVPB     07/26/19 0520       Devices    LINES / TUBES:  #7.5 ETT 10/2>>    Continuous Infusions:  sodium chloride     0.9 % NaCl with KCl 20 mEq / L 50 mL/hr at 07/26/19 0800   famotidine (PEPCID) IV 20 mg (07/26/19 1138)   fentaNYL infusion INTRAVENOUS 150 mcg/hr (07/26/19 1326)   midazolam 2 mg/hr (07/26/19 1139)   norepinephrine (LEVOPHED) Adult infusion 2 mcg/min (07/26/19 1200)   propofol (DIPRIVAN) infusion Stopped (07/26/19 0402)   remdesivir 100 mg in NS 250 mL       Objective: Vitals:   07/26/19 1100 07/26/19 1200 07/26/19 1218 07/26/19 1224  BP: (!) 85/59 (!) 82/58    Pulse: (!) 50 (!) 52    Resp: 19 19    Temp:      TempSrc:      SpO2: 100% 100% 100% 100%  Weight:      Height:        Intake/Output Summary (Last 24 hours) at 07/26/2019 1513 Last data filed at 07/26/2019 0800 Gross per 24 hour  Intake  1203.83 ml  Output 475 ml  Net 728.83 ml   Filed Weights   07/25/19 1635 07/26/19 0446  Weight: 96 kg 92.8 kg    Examination:  General: Somnolent, proned, positive acute respiratory distress Eyes: negative scleral hemorrhage, negative anisocoria, negative icterus ENT: Negative Runny nose, negative gingival bleeding, #7.5 ETT in place Neck:  Negative scars, masses, torticollis, lymphadenopathy, JVD Lungs: proned Clear to auscultation bilaterally without wheezes or crackles Cardiovascular: Bradycardic without murmur gallop or rub normal S1 and S2 Abdomen: negative abdominal pain, nondistended, positive soft, bowel sounds, no rebound, no ascites, no appreciable mass Extremities: No significant cyanosis, clubbing, or edema bilateral lower extremities Skin: Negative rashes, lesions, ulcers Psychiatric: Unable to evaluate secondary to sedation/intubation Central nervous system: Unable to evaluate secondary to sedation/intubation  .  Data Reviewed: Care during the described time interval was provided by me .  I have reviewed this patient's available data, including medical history, events of note, physical examination, and all test results as part of my evaluation.   CBC: Recent Labs  Lab 07/25/19 1656 07/26/19 0328 07/26/19 0425 07/26/19 0809 07/26/19 1222  WBC 10.2  --  14.0*  --   --   NEUTROABS 9.0*  --  12.9*  --   --   HGB 13.2 11.6* 11.7* 11.2* 11.6*  HCT 40.1 34.0* 36.0* 33.0* 34.0*  MCV 92.4  --  93.3  --   --   PLT 377  --  300  --   --    Basic Metabolic Panel: Recent Labs  Lab 07/25/19 1656 07/26/19 0328 07/26/19 0425 07/26/19 0809 07/26/19 1222  NA 134* 136 138 138 141  K 3.9 4.0 4.3 4.1 4.6  CL 97*  --  102  --   --   CO2 24  --  27  --   --   GLUCOSE 131*  --  188*  --   --   BUN 26*  --  31*  --   --   CREATININE 1.51*  --  1.35*  --   --   CALCIUM 8.4*  --  8.0*  --   --   MG  --   --  2.7*  --   --   PHOS  --   --  4.1  --   --     GFR: Estimated Creatinine Clearance: 61.6 mL/min (A) (by C-G formula based on SCr of 1.35 mg/dL (H)). Liver Function Tests: Recent Labs  Lab 07/25/19 1656 07/26/19 0425  AST 52* 47*  ALT 31 32  ALKPHOS 54 49  BILITOT 0.9 0.8  PROT 7.2 6.3*  ALBUMIN 3.1* 2.5*   No results for input(s): LIPASE, AMYLASE in the last 168 hours. No results for input(s): AMMONIA in the last 168 hours. Coagulation Profile: No results for input(s): INR, PROTIME in the last 168 hours. Cardiac Enzymes: No results for input(s): CKTOTAL, CKMB, CKMBINDEX, TROPONINI in the last 168 hours. BNP (last 3 results) No results for input(s): PROBNP in the last 8760 hours. HbA1C: No results for input(s): HGBA1C in the last 72 hours. CBG: Recent Labs  Lab 07/26/19 0332 07/26/19 0752 07/26/19 1245  GLUCAP 171* 158* 125*   Lipid Profile: Recent Labs    07/25/19 1656  TRIG 99   Thyroid Function Tests: No results for input(s): TSH, T4TOTAL, FREET4, T3FREE, THYROIDAB in the last 72 hours. Anemia Panel: Recent Labs    07/25/19 1656 07/26/19 0425  FERRITIN 1,417* 1,251*   Urine analysis:    Component Value Date/Time   COLORURINE YELLOW 07/25/2019 1713   APPEARANCEUR HAZY (A) 07/25/2019 1713   LABSPEC 1.027 07/25/2019 1713   PHURINE 5.0 07/25/2019 1713   GLUCOSEU NEGATIVE 07/25/2019 1713   HGBUR MODERATE (A) 07/25/2019 1713   BILIRUBINUR NEGATIVE 07/25/2019 1713   KETONESUR NEGATIVE 07/25/2019 1713   PROTEINUR 100 (A) 07/25/2019 1713   NITRITE NEGATIVE 07/25/2019 1713   LEUKOCYTESUR NEGATIVE 07/25/2019 1713   Sepsis Labs: _0 (procalcitonin:4,lacticidven:4)  ) Recent Results (from the past 240 hour(s))  Novel Coronavirus, NAA ([facility])     Status: Abnormal   Collection Time: 07/22/19 12:00 AM   Specimen: Oropharyngeal(OP) collection in vial transport medium   OROPHARYNGEA  TESTING  Result Value Ref Range Status   SARS-CoV-2, NAA Detected (A) Not Detected Final  Comment: This  nucleic acid amplification test was developed and its performance characteristics determined by [employer]. Nucleic acid amplification tests include PCR and TMA. This test has not been FDA cleared or approved. This test has been authorized by FDA under an Emergency Use Authorization (EUA). This test is only authorized for the duration of time the declaration that circumstances exist justifying the authorization of the emergency use of in vitro diagnostic tests for detection of SARS-CoV-2 virus and/or diagnosis of COVID-19 infection under section 564(b)(1) of the Act, 21 U.S.C. 408XKG-8(J) (1), unless the authorization is terminated or revoked sooner. When diagnostic testing is negative, the possibility of a false negative result should be considered in the context of a patient's recent exposures and the presence of clinical signs and symptoms consistent with COVID-19. An individual without symptoms of COVID-19 and who is not shedding SARS-CoV-2 virus would  expect to have a negative (not detected) result in this assay.   Blood Culture (routine x 2)     Status: None (Preliminary result)   Collection Time: 07/25/19  4:56 PM   Specimen: Left Antecubital; Blood  Result Value Ref Range Status   Specimen Description LEFT ANTECUBITAL  Final   Special Requests   Final    BOTTLES DRAWN AEROBIC AND ANAEROBIC Blood Culture results may not be optimal due to an excessive volume of blood received in culture bottles   Culture   Final    NO GROWTH < 24 HOURS Performed at [facility], [address] [place], [place] [address]    Report Status PENDING  Incomplete  Blood Culture (routine x 2)     Status: None (Preliminary result)   Collection Time: 07/25/19  5:01 PM   Specimen: BLOOD RIGHT HAND  Result Value Ref Range Status   Specimen Description BLOOD RIGHT HAND  Final   Special Requests   Final    BOTTLES DRAWN AEROBIC AND ANAEROBIC Blood Culture adequate volume   Culture   Final     NO GROWTH < 24 HOURS Performed at [facility], [address], [place], [place] [address]    Report Status PENDING  Incomplete  MRSA PCR Screening     Status: None   Collection Time: 07/26/19  2:09 AM   Specimen: Nasal Mucosa; Nasopharyngeal  Result Value Ref Range Status   MRSA by PCR NEGATIVE NEGATIVE Final    Comment:        The GeneXpert MRSA Assay (FDA approved for NASAL specimens only), is one component of a comprehensive MRSA colonization surveillance program. It is not intended to diagnose MRSA infection nor to guide or monitor treatment for MRSA infections. Performed at [facility], [place] [address] [place], [place] [address]          Radiology Studies: Dg Abd 1 View  Result Date: 07/26/2019 CLINICAL DATA:  Feeding tube placed EXAM: ABDOMEN - 1 VIEW COMPARISON:  None. FINDINGS: The enteric tube projects over the region of the gastric body/antrum. The tip is pointed distally. The bowel gas pattern is nonspecific and nonobstructive. Airspace disease is again noted and better visualized on prior x-ray. IMPRESSION: Enteric tube tip projects over the gastric body/antrum. Electronically Signed   By: Constance Holster M.D.   On: 07/26/2019 13:24   Ct Angio Chest Pe W Or Wo Contrast  Result Date: 07/25/2019 CLINICAL DATA:  Covid positive, increased work of breathing EXAM: CT ANGIOGRAPHY CHEST WITH CONTRAST TECHNIQUE: Multidetector CT imaging of the chest was performed using the standard protocol  during bolus administration of intravenous contrast. Multiplanar CT image reconstructions and MIPs were obtained to evaluate the vascular anatomy. CONTRAST:  1101m OMNIPAQUE IOHEXOL 350 MG/ML SOLN COMPARISON:  Radiograph same day FINDINGS: Cardiovascular: There is a optimal opacification of the pulmonary arteries. There is no central,segmental, or subsegmental filling defects within the pulmonary arteries. There is mild cardiomegaly. There is normal three-vessel  brachiocephalic anatomy without proximal stenosis. The thoracic aorta is normal in appearance. Mediastinum/Nodes: Scattered subcarinal and bilateral hilar lymph nodes are seen. Thyroid gland, trachea, and esophagus demonstrate no significant findings. Endotracheal tube tip is seen approximately 2 cm above the carina. Lungs/Pleura: Multifocal patchy airspace consolidation and ground-glass opacities are seen throughout both predominantly within the periphery. There is peribronchial thickening seen in the posterior bilateral lower lungs. No pleural effusion or pneumothorax is seen. Upper Abdomen: There is a 1 cm low-density lesion seen within the right hepatic lobe. NG tube tip is seen within the stomach. Musculoskeletal: No chest wall abnormality. No acute or significant osseous findings. Review of the MIP images confirms the above findings. IMPRESSION: No central, segmental, or subsegmental pulmonary embolism. Multifocal patchy airspace consolidation and ground-glass opacities, which can be seen with acute atypical infection, in particular, multifocal viral pneumonia (including COVID-19) Mild cardiomegaly Electronically Signed   By: BPrudencio PairM.D.   On: 07/25/2019 21:07   Dg Chest Port 1 View  Result Date: 07/26/2019 CLINICAL DATA:  COVID-19 positive.  Evaluate endotracheal tube. EXAM: PORTABLE CHEST 1 VIEW COMPARISON:  07/26/2019 FINDINGS: Patient rotated to the right. Endotracheal tube has tip 2.7 cm above the carina. Enteric tube has tip over the stomach with side-port in the region of the gastroesophageal junction. Right IJ central venous catheter unchanged with tip at the cavoatrial junction. Lungs are hypoinflated demonstrate worsening bilateral patchy airspace opacification over the mid to upper lungs compatible with known multifocal pneumonia. No effusion. Remainder the exam is unchanged. IMPRESSION: Worsening bilateral multifocal airspace process compatible with known multifocal pneumonia. Tubes and  lines as described. Electronically Signed   By: DMarin OlpM.D.   On: 07/26/2019 09:13   Dg Chest Port 1 View  Result Date: 07/26/2019 CLINICAL DATA:  Intubated patient EXAM: PORTABLE CHEST 1 VIEW COMPARISON:  07/25/2019 FINDINGS: Endotracheal tube tip is about 2.9 cm superior to the carina. Right IJ central venous catheter tip over the mid right atrium. Esophageal tube tip below the diaphragm, side-port in the region of the GE junction. Additional tubing visualized looped over the left lung apex. Slightly improved aeration though with considerable residual ground-glass opacities and consolidations. Stable cardiomediastinal silhouette. IMPRESSION: 1. Endotracheal tube tip about 2.9 cm superior to the carina 2. Esophageal tube side port at the level of GE junction, consider further advancement for more optimal positioning 3. Right IJ central venous catheter tip over the mid right atrium 4. Multifocal ground-glass opacities and consolidations consistent with pneumonia Electronically Signed   By: KDonavan FoilM.D.   On: 07/26/2019 02:36   Dg Chest Port 1 View  Result Date: 07/25/2019 CLINICAL DATA:  Check endotracheal tube repositioning EXAM: PORTABLE CHEST 1 VIEW COMPARISON:  07/25/2019, 5:23 p.m. FINDINGS: Endotracheal tube remains with tip above the thoracic inlet. Recommend repositioning and advancement. Esophagogastric tube with tip and side port below the diaphragm. Electrical lead versus vascular catheter projects over the right neck and chest, and appears to terminate in the vicinity of the superior cavoatrial junction. Unchanged diffuse, extensive bilateral heterogeneous opacity. Cardiomegaly. IMPRESSION: 1. Endotracheal tube remains with tip above the thoracic  inlet. Recommend repositioning and advancement. Esophagogastric tube with tip and side port below the diaphragm. 2. Electrical lead versus vascular catheter has been placed in the interval and projects over the right neck and chest, and  appears to terminate in the vicinity of the superior cavoatrial junction. 3. Unchanged diffuse, extensive bilateral heterogeneous opacity, consistent with multifocal infection, edema, and/or ARDS. These results will be called to the ordering clinician or representative by the Radiologist Assistant, and communication documented in the PACS or zVision Dashboard. Electronically Signed   By: Eddie Candle M.D.   On: 07/25/2019 20:26   Dg Chest Port 1 View  Result Date: 07/25/2019 CLINICAL DATA:  Shortness of breath, post ETT EXAM: PORTABLE CHEST 1 VIEW COMPARISON:  None. FINDINGS: Endotracheal tube is positioned above the thoracic inlet. Esophagogastric tube with tip and side port below the diaphragm. There is extensive heterogeneous airspace opacity bilaterally. Cardiomegaly. IMPRESSION: 1. Endotracheal tube is positioned above the thoracic inlet. Recommend repositioning and advancement. 2.  Esophagogastric tube with tip and side port below the diaphragm. 3. There is extensive heterogeneous airspace opacity bilaterally,, consistent with multifocal infection, edema, and/or ARDS. These results will be called to the ordering clinician or representative by the Radiologist Assistant, and communication documented in the PACS or zVision Dashboard. Electronically Signed   By: Eddie Candle M.D.   On: 07/25/2019 18:08        Scheduled Meds:  artificial tears  1 application Both Eyes W1U   chlorhexidine gluconate (MEDLINE KIT)  15 mL Mouth Rinse BID   Chlorhexidine Gluconate Cloth  6 each Topical Daily   dexamethasone (DECADRON) injection  6 mg Intravenous Q24H   enoxaparin (LOVENOX) injection  50 mg Subcutaneous Q12H   feeding supplement (PRO-STAT SUGAR FREE 64)  30 mL Per Tube BID   feeding supplement (VITAL HIGH PROTEIN)  1,000 mL Per Tube Q24H   fentaNYL (SUBLIMAZE) injection  25 mcg Intravenous Once   mouth rinse  15 mL Mouth Rinse 10 times per day   Continuous Infusions:  sodium chloride      0.9 % NaCl with KCl 20 mEq / L 50 mL/hr at 07/26/19 0800   famotidine (PEPCID) IV 20 mg (07/26/19 1138)   fentaNYL infusion INTRAVENOUS 150 mcg/hr (07/26/19 1326)   midazolam 2 mg/hr (07/26/19 1139)   norepinephrine (LEVOPHED) Adult infusion 2 mcg/min (07/26/19 1200)   propofol (DIPRIVAN) infusion Stopped (07/26/19 0402)   remdesivir 100 mg in NS 250 mL       LOS: 1 day   The patient is critically ill with multiple organ systems failure and requires high complexity decision making for assessment and support, frequent evaluation and titration of therapies, application of advanced monitoring technologies and extensive interpretation of multiple databases. Critical Care Time devoted to patient care services described in this note  Time spent: 40 minutes     Kashawn Dirr, Geraldo Docker, MD [facility] Pager [phone]  If 7PM-7AM, please contact night-coverage [email or web address] Password [email or web address] 07/26/2019, 3:13 PM

## 2019-07-26 NOTE — Assessment & Plan Note (Signed)
Patient had some acute kidney injury on his initial ER lab work.  Monitor his renal function.

## 2019-07-26 NOTE — Assessment & Plan Note (Signed)
We use our genetic protocol for ventilation.  Targeting 4 to 6 cc/kg/min.  Keeping his peak pressures less than 30.  Repeat his ABG.  If is a great is greater than 300, will proceed with prone ventilation.  Check chest x-ray to confirm ET tube placement.

## 2019-07-26 NOTE — Progress Notes (Signed)
eLink Physician-Brief Progress Note Patient Name: Luisdaniel Kenton DOB: July 29, 1951 MRN: [id number]   Date of Service  07/26/2019  HPI/Events of Note  25 M with no sig PMH presented with 5 day hx of SOB, COVID positive. He was hypooxic and is now intubated. CXR and CT with multifocal consolidation and ground glass opacity.  eICU Interventions  Started on dexamethasone and Remdesevir To be given tocilizumab Adequately +/- NMB and prone Protective lung strategy     Intervention Category Major Interventions: Respiratory failure - evaluation and management;Infection - evaluation and management Evaluation Type: New Patient Evaluation  Judd Lien 07/26/2019, 1:58 AM

## 2019-07-26 NOTE — Subjective & Objective (Signed)
CC: SOB, positive COVID-19 HPI: Previously healthy 68 year old male with a history of reflux who presented to [facility] ER with shortness of breath and hypoxia.  Patient was diagnosed with COVID 19 on July 13 9019.  Patient's wife also has COVID-19 but is asymptomatic.  Over the last 1 to 2 days, the patient's shortness of breath is increased.  Patient has been suffering with anorexia.  Patient's wife was actually having a conversation via teleconference with the patient's PCP.  PCP is also otherwise physician.  He had actually called to check on how the wife was doing.  The wife had mentioned to the physician that the patient has been laying in bed.  When the physician listened to how the patient was speaking over the phone, it was noted the patient was tachypneic.  EMS was activated.  When EMS arrived, the patient's O2 saturations were apparently in the 40 percentile range.  Patient was placed on nonrebreather.  He was transferred to the ER.  In the ER, his oxygen saturations on 100% nonrebreather were only in the 80s.  Patient was confused.  Patient was emergently intubated.  Patient given IV steroids.  Initial chest x-ray demonstrates diffuse multifocal infiltrates.  CT scan of the chest was negative for PE.  ABG prior to leaving for [place] demonstrates a pH 7.41 PCO2 44 PO2 of 87 on FiO2 100%.  AA gradient calculates out to 571.  I personally called the patient's wife on the evening of July 25, 2019.  During that conversation we had discussed using IV steroids, IV remdesivir.  We also discussed off label use of Actemra and possibly confluence of plasma due to the patient's severe COVID-19 pneumonia.  She verbally agreed that the medical team should try anything possible to make the patient better.  Confirm with the wife the patient is a full code.

## 2019-07-26 NOTE — Progress Notes (Signed)
Family update given to [facility].

## 2019-07-26 NOTE — Progress Notes (Signed)
Initial Nutrition Assessment  DOCUMENTATION CODES:   Not applicable  INTERVENTION:   Vital 1.5 @55ml /hr- Initiate at 35ml/hr and advance by 60ml/hr q 8 hours until goal rate is reached  Prostat 60ml TID via tube  Recommend free water flushes 53ml q4 hours to maintain tube patency   Regimen provides 2280kcal/day, 134g/day protein, 1167ml/day free water  Pt likely at high refeed risk; recommend monitor K, Mg and P labs daily  NUTRITION DIAGNOSIS:   Inadequate oral intake related to inability to eat(pt sedated and ventilated) as evidenced by NPO status.  GOAL:   Provide needs based on ASPEN/SCCM guidelines  MONITOR:   Vent status, Labs, Weight trends, TF tolerance, Skin, I & O's  REASON FOR ASSESSMENT:   Consult Enteral/tube feeding initiation and management  ASSESSMENT:   68 y/o male admitted with PNA and COVID 19  RD working remotely.  Pt sedated and ventilated. OGT in place with side port at GE junction; would recommend advancing tube ~5cm. Plan is to initiate tube feeds today. Per chart, pt with poor appetite and oral intake pta. Pt likely at high refeed risk; recommend monitor electrolytes. Per chart, pt appears fairly weight stable pta.   Medications reviewed and include: dexamethasone, lovenox, pepcid, NaCl w/ KCl @50ml /hr, levophed  Labs reviewed: BUN 31(H), creat 1.35(H), K 4.1 wnl, P 4.1 wnl, Mg 2.7(H) Wbc- 14.0(H)  Patient is currently intubated on ventilator support MV: 11.7 L/min Temp (24hrs), Avg:98.9 F (37.2 C), Min:94.1 F (34.5 C), Max:103.1 F (39.5 C)  Propofol: stopped  MAP- >15mmHg  Unable to complete Nutrition-Focused physical exam at this time.   Diet Order:   Diet Order            Diet NPO time specified  Diet effective now             EDUCATION NEEDS:   No education needs have been identified at this time  Skin:  Skin Assessment: Reviewed RN Assessment  Last BM:  PTA  Height:   Ht Readings from Last 1 Encounters:   07/26/19 5\' 10"  (1.778 m)    Weight:   Wt Readings from Last 1 Encounters:  07/26/19 92.8 kg    Ideal Body Weight:  75.45 kg  BMI:  Body mass index is 29.36 kg/m.  Estimated Nutritional Needs:   Kcal:  2396kcal/day  Protein:  115-130g/day  Fluid:  >1.9L/day  Koleen Distance MS, RD, LDN Pager #- [phone] Office#- [phone] After Hours Pager: [phone]

## 2019-07-26 NOTE — Progress Notes (Signed)
Consent for Plasma obtained via telephone by wife. Witnessed by [employer].  Information document mailed to wife's e-mail, [email or web address]

## 2019-07-27 DIAGNOSIS — N400 Enlarged prostate without lower urinary tract symptoms: Secondary | ICD-10-CM

## 2019-07-27 DIAGNOSIS — J9600 Acute respiratory failure, unspecified whether with hypoxia or hypercapnia: Secondary | ICD-10-CM | POA: Diagnosis not present

## 2019-07-27 DIAGNOSIS — J9601 Acute respiratory failure with hypoxia: Secondary | ICD-10-CM | POA: Diagnosis not present

## 2019-07-27 DIAGNOSIS — Z9689 Presence of other specified functional implants: Secondary | ICD-10-CM | POA: Diagnosis not present

## 2019-07-27 DIAGNOSIS — U071 COVID-19: Secondary | ICD-10-CM | POA: Diagnosis not present

## 2019-07-27 DIAGNOSIS — K922 Gastrointestinal hemorrhage, unspecified: Secondary | ICD-10-CM | POA: Diagnosis not present

## 2019-07-27 DIAGNOSIS — I4891 Unspecified atrial fibrillation: Secondary | ICD-10-CM | POA: Diagnosis not present

## 2019-07-27 DIAGNOSIS — E8770 Fluid overload, unspecified: Secondary | ICD-10-CM | POA: Diagnosis not present

## 2019-07-27 DIAGNOSIS — N179 Acute kidney failure, unspecified: Secondary | ICD-10-CM | POA: Diagnosis not present

## 2019-07-27 DIAGNOSIS — J1289 Other viral pneumonia: Secondary | ICD-10-CM | POA: Diagnosis not present

## 2019-07-27 LAB — COMPREHENSIVE METABOLIC PANEL
ALT: 29 U/L (ref 0–44)
AST: 30 U/L (ref 15–41)
Albumin: 2.6 g/dL — ABNORMAL LOW (ref 3.5–5.0)
Alkaline Phosphatase: 66 U/L (ref 38–126)
Anion gap: 9 (ref 5–15)
BUN: 49 mg/dL — ABNORMAL HIGH (ref 8–23)
CO2: 24 mmol/L (ref 22–32)
Calcium: 8.4 mg/dL — ABNORMAL LOW (ref 8.9–10.3)
Chloride: 111 mmol/L (ref 98–111)
Creatinine, Ser: 1.95 mg/dL — ABNORMAL HIGH (ref 0.61–1.24)
GFR calc Af Amer: 40 mL/min — ABNORMAL LOW (ref >60)
GFR calc non Af Amer: 35 mL/min — ABNORMAL LOW (ref >60)
Glucose, Bld: 214 mg/dL — ABNORMAL HIGH (ref 70–99)
Potassium: 4.5 mmol/L (ref 3.5–5.1)
Sodium: 144 mmol/L (ref 135–145)
Total Bilirubin: 0.6 mg/dL (ref 0.3–1.2)
Total Protein: 6.8 g/dL (ref 6.5–8.1)

## 2019-07-27 LAB — POCT I-STAT 7, (LYTES, BLD GAS, ICA,H+H)
Acid-Base Excess: 3 mmol/L — ABNORMAL HIGH (ref 0.0–2.0)
Acid-base deficit: 1 mmol/L (ref 0.0–2.0)
Acid-base deficit: 2 mmol/L (ref 0.0–2.0)
Bicarbonate: 25 mmol/L (ref 20.0–28.0)
Bicarbonate: 25.2 mmol/L (ref 20.0–28.0)
Bicarbonate: 30.1 mmol/L — ABNORMAL HIGH (ref 20.0–28.0)
Calcium, Ion: 1.17 mmol/L (ref 1.15–1.40)
Calcium, Ion: 1.21 mmol/L (ref 1.15–1.40)
Calcium, Ion: 1.21 mmol/L (ref 1.15–1.40)
HCT: 32 % — ABNORMAL LOW (ref 39.0–52.0)
HCT: 34 % — ABNORMAL LOW (ref 39.0–52.0)
HCT: 36 % — ABNORMAL LOW (ref 39.0–52.0)
Hemoglobin: 10.9 g/dL — ABNORMAL LOW (ref 13.0–17.0)
Hemoglobin: 11.6 g/dL — ABNORMAL LOW (ref 13.0–17.0)
Hemoglobin: 12.2 g/dL — ABNORMAL LOW (ref 13.0–17.0)
O2 Saturation: 95 %
O2 Saturation: 95 %
O2 Saturation: 98 %
Patient temperature: 36.4
Patient temperature: 37
Patient temperature: 98.2
Potassium: 4 mmol/L (ref 3.5–5.1)
Potassium: 4.4 mmol/L (ref 3.5–5.1)
Potassium: 4.5 mmol/L (ref 3.5–5.1)
Sodium: 143 mmol/L (ref 135–145)
Sodium: 146 mmol/L — ABNORMAL HIGH (ref 135–145)
Sodium: 146 mmol/L — ABNORMAL HIGH (ref 135–145)
TCO2: 26 mmol/L (ref 22–32)
TCO2: 27 mmol/L (ref 22–32)
TCO2: 32 mmol/L (ref 22–32)
pCO2 arterial: 47.5 mmHg (ref 32.0–48.0)
pCO2 arterial: 49.7 mmHg — ABNORMAL HIGH (ref 32.0–48.0)
pCO2 arterial: 52.6 mmHg — ABNORMAL HIGH (ref 32.0–48.0)
pH, Arterial: 7.313 — ABNORMAL LOW (ref 7.350–7.450)
pH, Arterial: 7.329 — ABNORMAL LOW (ref 7.350–7.450)
pH, Arterial: 7.362 (ref 7.350–7.450)
pO2, Arterial: 121 mmHg — ABNORMAL HIGH (ref 83.0–108.0)
pO2, Arterial: 76 mmHg — ABNORMAL LOW (ref 83.0–108.0)
pO2, Arterial: 81 mmHg — ABNORMAL LOW (ref 83.0–108.0)

## 2019-07-27 LAB — CBC WITH DIFFERENTIAL/PLATELET
Abs Immature Granulocytes: 0.31 10*3/uL — ABNORMAL HIGH (ref 0.00–0.07)
Basophils Absolute: 0 10*3/uL (ref 0.0–0.1)
Basophils Relative: 0 %
Eosinophils Absolute: 0 10*3/uL (ref 0.0–0.5)
Eosinophils Relative: 0 %
HCT: 38.6 % — ABNORMAL LOW (ref 39.0–52.0)
Hemoglobin: 12.4 g/dL — ABNORMAL LOW (ref 13.0–17.0)
Immature Granulocytes: 2 %
Lymphocytes Relative: 3 %
Lymphs Abs: 0.5 10*3/uL — ABNORMAL LOW (ref 0.7–4.0)
MCH: 30.5 pg (ref 26.0–34.0)
MCHC: 32.1 g/dL (ref 30.0–36.0)
MCV: 94.8 fL (ref 80.0–100.0)
Monocytes Absolute: 0.4 10*3/uL (ref 0.1–1.0)
Monocytes Relative: 2 %
Neutro Abs: 15.2 10*3/uL — ABNORMAL HIGH (ref 1.7–7.7)
Neutrophils Relative %: 93 %
Platelets: 437 10*3/uL — ABNORMAL HIGH (ref 150–400)
RBC: 4.07 MIL/uL — ABNORMAL LOW (ref 4.22–5.81)
RDW: 14.6 % (ref 11.5–15.5)
WBC: 16.4 10*3/uL — ABNORMAL HIGH (ref 4.0–10.5)
nRBC: 0.4 % — ABNORMAL HIGH (ref 0.0–0.2)

## 2019-07-27 LAB — GLUCOSE, CAPILLARY
Glucose-Capillary: 143 mg/dL — ABNORMAL HIGH (ref 70–99)
Glucose-Capillary: 147 mg/dL — ABNORMAL HIGH (ref 70–99)
Glucose-Capillary: 168 mg/dL — ABNORMAL HIGH (ref 70–99)
Glucose-Capillary: 170 mg/dL — ABNORMAL HIGH (ref 70–99)
Glucose-Capillary: 171 mg/dL — ABNORMAL HIGH (ref 70–99)
Glucose-Capillary: 189 mg/dL — ABNORMAL HIGH (ref 70–99)
Glucose-Capillary: 190 mg/dL — ABNORMAL HIGH (ref 70–99)

## 2019-07-27 LAB — C-REACTIVE PROTEIN: CRP: 16.6 mg/dL — ABNORMAL HIGH (ref <1.0)

## 2019-07-27 LAB — PHOSPHORUS: Phosphorus: 3.4 mg/dL (ref 2.5–4.6)

## 2019-07-27 LAB — MAGNESIUM: Magnesium: 3.2 mg/dL — ABNORMAL HIGH (ref 1.7–2.4)

## 2019-07-27 LAB — D-DIMER, QUANTITATIVE: D-Dimer, Quant: >20 ug/mL-FEU — ABNORMAL HIGH (ref 0.00–0.50)

## 2019-07-27 MED ORDER — FUROSEMIDE 10 MG/ML IJ SOLN
40.0000 mg | Freq: Four times a day (QID) | INTRAMUSCULAR | Status: AC
  Administered 2019-07-27 (×2): 40 mg via INTRAVENOUS
  Filled 2019-07-27 (×2): qty 4

## 2019-07-27 MED ORDER — VITAL 1.5 CAL PO LIQD
1000.0000 mL | ORAL | Status: DC
  Administered 2019-07-27 – 2019-08-02 (×5): 1000 mL
  Filled 2019-07-27 (×15): qty 1000

## 2019-07-27 MED ORDER — FAMOTIDINE 40 MG/5ML PO SUSR
20.0000 mg | Freq: Two times a day (BID) | ORAL | Status: DC
  Administered 2019-07-27 – 2019-08-01 (×10): 20 mg
  Filled 2019-07-27 (×11): qty 2.5

## 2019-07-27 MED ORDER — PRO-STAT SUGAR FREE PO LIQD
30.0000 mL | Freq: Three times a day (TID) | ORAL | Status: DC
  Administered 2019-07-27 – 2019-08-06 (×24): 30 mL
  Filled 2019-07-27 (×25): qty 30

## 2019-07-27 NOTE — Progress Notes (Signed)
Assisted by nursing with turning patient's head to left, and arms rotated.  No complications, ETT secure at 28cm.

## 2019-07-27 NOTE — Progress Notes (Signed)
RT x 2 and RN x 5 moved patient to prone position with the patient's head turned to the left without complications.  ETT remains secured at 28 with cloth tape.  Pads placed on back of patient's neck, cheeks, and above the upper lips before the ETT was secured with cloth tape.

## 2019-07-27 NOTE — Plan of Care (Signed)
  Problem: Respiratory: Goal: Will maintain a patent airway 07/27/2019 0223 by Val Eagle, RN Outcome: Progressing 07/27/2019 0222 by Val Eagle, RN Outcome: Progressing   Problem: Clinical Measurements: Goal: Cardiovascular complication will be avoided Outcome: Progressing   Problem: Nutrition: Goal: Adequate nutrition will be maintained Outcome: Progressing   Problem: Elimination: Goal: Will not experience complications related to bowel motility Outcome: Progressing   Problem: Pain Managment: Goal: General experience of comfort will improve Outcome: Progressing   Problem: Safety: Goal: Ability to remain free from injury will improve Outcome: Progressing   Problem: Skin Integrity: Goal: Risk for impaired skin integrity will decrease Outcome: Progressing

## 2019-07-27 NOTE — Progress Notes (Signed)
PROGRESS NOTE    [name]  AGT:[id number] DOB: Mar 15, 1951 DOA: 07/25/2019 PCP: Albert Drown, MD   Brief Narrative:  68 year old BM PMHx GERD, BPH   Presented to [facility] ER with shortness of breath and hypoxia.  Patient was diagnosed with COVID 19 on July 13 9019.  Patient's wife also has COVID-19 but is asymptomatic.  Over the last 1 to 2 days, the patient's shortness of breath is increased.  Patient has been suffering with anorexia.  Patient's wife was actually having a conversation via teleconference with the patient's PCP.  PCP is also otherwise physician.  He had actually called to check on how the wife was doing.  The wife had mentioned to the physician that the patient has been laying in bed.  When the physician listened to how the patient was speaking over the phone, it was noted the patient was tachypneic.  EMS was activated.  When EMS arrived, the patient's O2 saturations were apparently in the 40 percentile range.  Patient was placed on nonrebreather.  He was transferred to the ER.  In the ER, his oxygen saturations on 100% nonrebreather were only in the 80s.  Patient was confused.  Patient was emergently intubated.  Patient given IV steroids.  Initial chest x-ray demonstrates diffuse multifocal infiltrates.  CT scan of the chest was negative for PE.  ABG prior to leaving for [place] demonstrates a pH 7.41 PCO2 44 PO2 of 87 on FiO2 100%.  AA gradient calculates out to 571.  I personally called the patient's wife on the evening of July 25, 2019.  During that conversation we had discussed using IV steroids, IV remdesivir.  We also discussed off label use of Actemra and possibly confluence of plasma due to the patient's severe COVID-19 pneumonia.  She verbally agreed that the medical team should try anything possible to make the patient better.  Confirm with the wife the patient is a full code.    ED Course: emergently intubated. Central line placed. Started on  IV Decadron.    Subjective: 10/4 somnolent, supine, sedated, last 24 hours afebrile.   Assessment & Plan:   Principal Problem:   Pneumonia due to COVID-19 virus Active Problems:   BPH (benign prostatic hyperplasia)   Acute respiratory failure due to COVID-19 (HCC)   AKI (acute kidney injury) ([place])  COVID pneumonia/acute respiratory stress with hypoxia - Continue Decadron - Continue Remdesivir per pharmacy protocol - 10/2 Actemra x1 dose - 10/3 counseled patient wife on CCP over phone and she has agreed to allow [place] to administer CCP.  Awaiting paperwork - Continue prone patient> 16 hours/day if patient tolerates.  If not prone patient for 2 to 3 hours every shift. Recent Labs  Lab 07/25/19 1656 07/26/19 0425 07/27/19 0231  CRP 19.2* 18.0* 16.6*   Recent Labs  Lab 07/25/19 1656 07/26/19 0425 07/27/19 0231  DDIMER >20.00* >20.00* >20.00*  - Titrate patient's vent settings to maintain SPO2 88 to 93%, if possible.  Will use ARDS lung protective protocol (6 to 8 cc/kg/min). - Attempt to maintain plateau pressure<30 - Attempt to maintain driving pressure 15  Fluid overload - Strict in and out +2.1 L -Daily weight Filed Weights   07/25/19 1635 07/26/19 0446 07/27/19 0500  Weight: 96 kg 92.8 kg 92.6 kg  - CVP daily - 10/4 CVP= 12 -10/4 Lasix IV 40 mg x 2 doses  Acute kidney injury - Avoid nephrotoxic medication Recent Labs  Lab 07/25/19 1656 07/26/19 0425 07/27/19 0231  CREATININE 1.51* 1.35* 1.95*  -Monitor closely trended up today; received Lasix secondary to fluid overload  BPH -Not on home meds.  But be cognizant of urinary retention    Goals of care - CCP use; consult [name] that CCP is not improving therapy for COVID-19 though available data shows it may have benefit with high titer plasma is given.  Is unclear if it is effective other patients and with use of low titer plasma.  Risk and benefits of its use were explained to [name] and she  agreed to treatment..  All appropriate IRB consent paperwork has been signed and is in patient's chart.   DVT prophylaxis: Lovenox (ICU dose) Code Status: Full Family Communication: 10/4 [name] spoke with family Disposition Plan: TBD   Consultants:  PCCM   Procedures/Significant Events:  10/2 PCXR;-Endotracheal tube remains with tip above the thoracic inlet. Recommend repositioning and advancement. Esophagogastric tube with tip and side port below the diaphragm. - Unchanged diffuse, extensive bilateral heterogeneous opacity, consistent with multifocal infection, edema, and/or ARDS. 10/3 PCXR; slightly improved extensive bilateral heterogeneous opacifications (my read)      I have personally reviewed and interpreted all radiology studies and my findings are as above.  VENTILATOR SETTINGS: Vent mode; PRVC Vt Set; 510 Set rate; 20 FiO2; 60% I time;  PEEP; 12   Cultures 9/29 Novel coronavirus positive 10/2 blood LEFT antecubital NGTD 10/2 blood hand NGTD 10/3 MRSA by PCR negative     Antimicrobials: Anti-infectives (From admission, onward)   Start     Stop   07/26/19 1600  remdesivir 100 mg in sodium chloride 0.9 % 250 mL IVPB     07/30/19 1559   07/26/19 0215  remdesivir 200 mg in sodium chloride 0.9 % 250 mL IVPB     07/26/19 0520       Devices    LINES / TUBES:  #7.5 ETT 10/2>> RIGHT IJ CVL>>>   Continuous Infusions:  sodium chloride     sodium chloride 50 mL/hr at 07/27/19 0700   famotidine (PEPCID) IV 20 mg (07/26/19 2231)   fentaNYL infusion INTRAVENOUS 150 mcg/hr (07/27/19 0700)   midazolam 2 mg/hr (07/27/19 0700)   norepinephrine (LEVOPHED) Adult infusion 1 mcg/min (07/27/19 0700)   propofol (DIPRIVAN) infusion Stopped (07/26/19 0402)   remdesivir 100 mg in NS 250 mL Stopped (07/26/19 1932)     Objective: Vitals:   07/27/19 0437 07/27/19 0500 07/27/19 0600 07/27/19 0700  BP:  133/70 136/71 122/69  Pulse: (!) 56 (!)  53 (!) 57 (!) 56  Resp: (!) 21 (!) _0 Temp:  98.2 F (36.8 C) 99 F (37.2 C) 99.1 F (37.3 C)  TempSrc:  Bladder Bladder Bladder  SpO2: 97% 96% 95% 95%  Weight:  92.6 kg    Height:        Intake/Output Summary (Last 24 hours) at 07/27/2019 0802 Last data filed at 07/27/2019 0700 Gross per 24 hour  Intake 3093.94 ml  Output 1500 ml  Net 1593.94 ml   Filed Weights   07/25/19 1635 07/26/19 0446 07/27/19 0500  Weight: 96 kg 92.8 kg 92.6 kg   Physical Exam:  General: Somnolent/sedated, supine, positive acute respiratory distress Eyes: negative scleral hemorrhage, negative anisocoria, negative icterus ENT: Negative Runny nose, negative gingival bleeding, #7.5 ETT tube in place Neck:  Negative scars, masses, torticollis, lymphadenopathy, JVD, RIGHT IJ CVL in place negative sign of infection. Lungs: Diffuse decreased breath sounds, without wheezes or crackles Cardiovascular: Bradycardic, without  murmur gallop or rub normal S1 and S2 Abdomen: negative abdominal pain, nondistended, positive soft, bowel sounds, no rebound, no ascites, no appreciable mass Extremities: No significant cyanosis, clubbing, or edema bilateral lower extremities Skin: Negative rashes, lesions, ulcers Psychiatric: Intubated/sedated unable to evaluate  Central nervous system: Intubated/sedated unable to evaluate       Data Reviewed: Care during the described time interval was provided by me .  I have reviewed this patient's available data, including medical history, events of note, physical examination, and all test results as part of my evaluation.   CBC: Recent Labs  Lab 07/25/19 1656  07/26/19 0425 07/26/19 0809 07/26/19 1222 07/27/19 0023 07/27/19 0231 07/27/19 0546  WBC 10.2  --  14.0*  --   --   --  16.4*  --   NEUTROABS 9.0*  --  12.9*  --   --   --  15.2*  --   HGB 13.2   < > 11.7* 11.2* 11.6* 12.2* 12.4* 10.9*  HCT 40.1   < > 36.0* 33.0* 34.0* 36.0* 38.6* 32.0*  MCV 92.4  --  93.3   --   --   --  94.8  --   PLT 377  --  300  --   --   --  437*  --    < > = values in this interval not displayed.   Basic Metabolic Panel: Recent Labs  Lab 07/25/19 1656  07/26/19 0425 07/26/19 0809 07/26/19 1222 07/27/19 0023 07/27/19 0231 07/27/19 0546  NA 134*   < > 138 138 141 143 144 146*  K 3.9   < > 4.3 4.1 4.6 4.4 4.5 4.0  CL 97*  --  102  --   --   --  111  --   CO2 24  --  27  --   --   --  24  --   GLUCOSE 131*  --  188*  --   --   --  214*  --   BUN 26*  --  31*  --   --   --  49*  --   CREATININE 1.51*  --  1.35*  --   --   --  1.95*  --   CALCIUM 8.4*  --  8.0*  --   --   --  8.4*  --   MG  --   --  2.7*  --   --   --  3.2*  --   PHOS  --   --  4.1  --   --   --  3.4  --    < > = values in this interval not displayed.   GFR: Estimated Creatinine Clearance: 42.6 mL/min (A) (by C-G formula based on SCr of 1.95 mg/dL (H)). Liver Function Tests: Recent Labs  Lab 07/25/19 1656 07/26/19 0425 07/27/19 0231  AST 52* 47* 30  ALT 31 32 29  ALKPHOS 54 49 66  BILITOT 0.9 0.8 0.6  PROT 7.2 6.3* 6.8  ALBUMIN 3.1* 2.5* 2.6*   No results for input(s): LIPASE, AMYLASE in the last 168 hours. No results for input(s): AMMONIA in the last 168 hours. Coagulation Profile: No results for input(s): INR, PROTIME in the last 168 hours. Cardiac Enzymes: No results for input(s): CKTOTAL, CKMB, CKMBINDEX, TROPONINI in the last 168 hours. BNP (last 3 results) No results for input(s): PROBNP in the last 8760 hours. HbA1C: Recent Labs    07/26/19 0500  HGBA1C 6.1*  CBG: Recent Labs  Lab 07/26/19 1245 07/26/19 1638 07/26/19 1945 07/26/19 2356 07/27/19 0419  GLUCAP 125* 138* 148* 189* 190*   Lipid Profile: Recent Labs    07/25/19 1656  TRIG 99   Thyroid Function Tests: No results for input(s): TSH, T4TOTAL, FREET4, T3FREE, THYROIDAB in the last 72 hours. Anemia Panel: Recent Labs    07/25/19 1656 07/26/19 0425  FERRITIN 1,417* 1,251*   Urine analysis:     Component Value Date/Time   COLORURINE YELLOW 07/25/2019 1713   APPEARANCEUR HAZY (A) 07/25/2019 1713   LABSPEC 1.027 07/25/2019 1713   PHURINE 5.0 07/25/2019 1713   GLUCOSEU NEGATIVE 07/25/2019 1713   HGBUR MODERATE (A) 07/25/2019 1713   BILIRUBINUR NEGATIVE 07/25/2019 1713   KETONESUR NEGATIVE 07/25/2019 1713   PROTEINUR 100 (A) 07/25/2019 1713   NITRITE NEGATIVE 07/25/2019 1713   LEUKOCYTESUR NEGATIVE 07/25/2019 1713   Sepsis Labs: _0 (procalcitonin:4,lacticidven:4)  ) Recent Results (from the past 240 hour(s))  Novel Coronavirus, NAA ([facility])     Status: Abnormal   Collection Time: 07/22/19 12:00 AM   Specimen: Oropharyngeal(OP) collection in vial transport medium   OROPHARYNGEA  TESTING  Result Value Ref Range Status   SARS-CoV-2, NAA Detected (A) Not Detected Final    Comment: This nucleic acid amplification test was developed and its performance characteristics determined by [employer]. Nucleic acid amplification tests include PCR and TMA. This test has not been FDA cleared or approved. This test has been authorized by FDA under an Emergency Use Authorization (EUA). This test is only authorized for the duration of time the declaration that circumstances exist justifying the authorization of the emergency use of in vitro diagnostic tests for detection of SARS-CoV-2 virus and/or diagnosis of COVID-19 infection under section 564(b)(1) of the Act, 21 U.S.C. 916XIH-0(T) (1), unless the authorization is terminated or revoked sooner. When diagnostic testing is negative, the possibility of a false negative result should be considered in the context of a patient's recent exposures and the presence of clinical signs and symptoms consistent with COVID-19. An individual without symptoms of COVID-19 and who is not shedding SARS-CoV-2 virus would  expect to have a negative (not detected) result in this assay.   Blood Culture (routine x 2)     Status: None  (Preliminary result)   Collection Time: 07/25/19  4:56 PM   Specimen: Left Antecubital; Blood  Result Value Ref Range Status   Specimen Description LEFT ANTECUBITAL  Final   Special Requests   Final    BOTTLES DRAWN AEROBIC AND ANAEROBIC Blood Culture results may not be optimal due to an excessive volume of blood received in culture bottles   Culture   Final    NO GROWTH 2 DAYS Performed at [facility], [address], [place], [place] [address]    Report Status PENDING  Incomplete  Blood Culture (routine x 2)     Status: None (Preliminary result)   Collection Time: 07/25/19  5:01 PM   Specimen: BLOOD RIGHT HAND  Result Value Ref Range Status   Specimen Description BLOOD RIGHT HAND  Final   Special Requests   Final    BOTTLES DRAWN AEROBIC AND ANAEROBIC Blood Culture adequate volume   Culture   Final    NO GROWTH 2 DAYS Performed at [facility], [address] [place], [place] [address]    Report Status PENDING  Incomplete  MRSA PCR Screening     Status: None   Collection Time: 07/26/19  2:09 AM   Specimen: Nasal  Mucosa; Nasopharyngeal  Result Value Ref Range Status   MRSA by PCR NEGATIVE NEGATIVE Final    Comment:        The GeneXpert MRSA Assay (FDA approved for NASAL specimens only), is one component of a comprehensive MRSA colonization surveillance program. It is not intended to diagnose MRSA infection nor to guide or monitor treatment for MRSA infections. Performed at [facility], [place] [address] [place], [place] [address]          Radiology Studies: Dg Abd 1 View  Result Date: 07/26/2019 CLINICAL DATA:  Feeding tube placed EXAM: ABDOMEN - 1 VIEW COMPARISON:  None. FINDINGS: The enteric tube projects over the region of the gastric body/antrum. The tip is pointed distally. The bowel gas pattern is nonspecific and nonobstructive. Airspace disease is again noted and better visualized on prior x-ray. IMPRESSION: Enteric tube tip projects over  the gastric body/antrum. Electronically Signed   By: Constance Holster M.D.   On: 07/26/2019 13:24   Ct Angio Chest Pe W Or Wo Contrast  Result Date: 07/25/2019 CLINICAL DATA:  Covid positive, increased work of breathing EXAM: CT ANGIOGRAPHY CHEST WITH CONTRAST TECHNIQUE: Multidetector CT imaging of the chest was performed using the standard protocol during bolus administration of intravenous contrast. Multiplanar CT image reconstructions and MIPs were obtained to evaluate the vascular anatomy. CONTRAST:  142m OMNIPAQUE IOHEXOL 350 MG/ML SOLN COMPARISON:  Radiograph same day FINDINGS: Cardiovascular: There is a optimal opacification of the pulmonary arteries. There is no central,segmental, or subsegmental filling defects within the pulmonary arteries. There is mild cardiomegaly. There is normal three-vessel brachiocephalic anatomy without proximal stenosis. The thoracic aorta is normal in appearance. Mediastinum/Nodes: Scattered subcarinal and bilateral hilar lymph nodes are seen. Thyroid gland, trachea, and esophagus demonstrate no significant findings. Endotracheal tube tip is seen approximately 2 cm above the carina. Lungs/Pleura: Multifocal patchy airspace consolidation and ground-glass opacities are seen throughout both predominantly within the periphery. There is peribronchial thickening seen in the posterior bilateral lower lungs. No pleural effusion or pneumothorax is seen. Upper Abdomen: There is a 1 cm low-density lesion seen within the right hepatic lobe. NG tube tip is seen within the stomach. Musculoskeletal: No chest wall abnormality. No acute or significant osseous findings. Review of the MIP images confirms the above findings. IMPRESSION: No central, segmental, or subsegmental pulmonary embolism. Multifocal patchy airspace consolidation and ground-glass opacities, which can be seen with acute atypical infection, in particular, multifocal viral pneumonia (including COVID-19) Mild cardiomegaly  Electronically Signed   By: BPrudencio PairM.D.   On: 07/25/2019 21:07   Dg Chest Port 1 View  Result Date: 07/26/2019 CLINICAL DATA:  COVID-19 positive.  Evaluate endotracheal tube. EXAM: PORTABLE CHEST 1 VIEW COMPARISON:  07/26/2019 FINDINGS: Patient rotated to the right. Endotracheal tube has tip 2.7 cm above the carina. Enteric tube has tip over the stomach with side-port in the region of the gastroesophageal junction. Right IJ central venous catheter unchanged with tip at the cavoatrial junction. Lungs are hypoinflated demonstrate worsening bilateral patchy airspace opacification over the mid to upper lungs compatible with known multifocal pneumonia. No effusion. Remainder the exam is unchanged. IMPRESSION: Worsening bilateral multifocal airspace process compatible with known multifocal pneumonia. Tubes and lines as described. Electronically Signed   By: DMarin OlpM.D.   On: 07/26/2019 09:13   Dg Chest Port 1 View  Result Date: 07/26/2019 CLINICAL DATA:  Intubated patient EXAM: PORTABLE CHEST 1 VIEW COMPARISON:  07/25/2019 FINDINGS: Endotracheal tube tip is about 2.9  cm superior to the carina. Right IJ central venous catheter tip over the mid right atrium. Esophageal tube tip below the diaphragm, side-port in the region of the GE junction. Additional tubing visualized looped over the left lung apex. Slightly improved aeration though with considerable residual ground-glass opacities and consolidations. Stable cardiomediastinal silhouette. IMPRESSION: 1. Endotracheal tube tip about 2.9 cm superior to the carina 2. Esophageal tube side port at the level of GE junction, consider further advancement for more optimal positioning 3. Right IJ central venous catheter tip over the mid right atrium 4. Multifocal ground-glass opacities and consolidations consistent with pneumonia Electronically Signed   By: Donavan Foil M.D.   On: 07/26/2019 02:36   Dg Chest Port 1 View  Result Date: 07/25/2019 CLINICAL  DATA:  Check endotracheal tube repositioning EXAM: PORTABLE CHEST 1 VIEW COMPARISON:  07/25/2019, 5:23 p.m. FINDINGS: Endotracheal tube remains with tip above the thoracic inlet. Recommend repositioning and advancement. Esophagogastric tube with tip and side port below the diaphragm. Electrical lead versus vascular catheter projects over the right neck and chest, and appears to terminate in the vicinity of the superior cavoatrial junction. Unchanged diffuse, extensive bilateral heterogeneous opacity. Cardiomegaly. IMPRESSION: 1. Endotracheal tube remains with tip above the thoracic inlet. Recommend repositioning and advancement. Esophagogastric tube with tip and side port below the diaphragm. 2. Electrical lead versus vascular catheter has been placed in the interval and projects over the right neck and chest, and appears to terminate in the vicinity of the superior cavoatrial junction. 3. Unchanged diffuse, extensive bilateral heterogeneous opacity, consistent with multifocal infection, edema, and/or ARDS. These results will be called to the ordering clinician or representative by the Radiologist Assistant, and communication documented in the PACS or zVision Dashboard. Electronically Signed   By: Eddie Candle M.D.   On: 07/25/2019 20:26   Dg Chest Port 1 View  Result Date: 07/25/2019 CLINICAL DATA:  Shortness of breath, post ETT EXAM: PORTABLE CHEST 1 VIEW COMPARISON:  None. FINDINGS: Endotracheal tube is positioned above the thoracic inlet. Esophagogastric tube with tip and side port below the diaphragm. There is extensive heterogeneous airspace opacity bilaterally. Cardiomegaly. IMPRESSION: 1. Endotracheal tube is positioned above the thoracic inlet. Recommend repositioning and advancement. 2.  Esophagogastric tube with tip and side port below the diaphragm. 3. There is extensive heterogeneous airspace opacity bilaterally,, consistent with multifocal infection, edema, and/or ARDS. These results will be called  to the ordering clinician or representative by the Radiologist Assistant, and communication documented in the PACS or zVision Dashboard. Electronically Signed   By: Eddie Candle M.D.   On: 07/25/2019 18:08        Scheduled Meds:  sodium chloride   Intravenous Once   artificial tears  1 application Both Eyes O0B   chlorhexidine gluconate (MEDLINE KIT)  15 mL Mouth Rinse BID   Chlorhexidine Gluconate Cloth  6 each Topical Daily   dexamethasone (DECADRON) injection  6 mg Intravenous Q24H   enoxaparin (LOVENOX) injection  50 mg Subcutaneous Q12H   feeding supplement (PRO-STAT SUGAR FREE 64)  30 mL Per Tube BID   feeding supplement (VITAL HIGH PROTEIN)  1,000 mL Per Tube Q24H   fentaNYL (SUBLIMAZE) injection  25 mcg Intravenous Once   insulin aspart  0-9 Units Subcutaneous Q4H   mouth rinse  15 mL Mouth Rinse 10 times per day   Continuous Infusions:  sodium chloride     sodium chloride 50 mL/hr at 07/27/19 0700   famotidine (PEPCID) IV 20 mg (07/26/19 2231)  fentaNYL infusion INTRAVENOUS 150 mcg/hr (07/27/19 0700)   midazolam 2 mg/hr (07/27/19 0700)   norepinephrine (LEVOPHED) Adult infusion 1 mcg/min (07/27/19 0700)   propofol (DIPRIVAN) infusion Stopped (07/26/19 0402)   remdesivir 100 mg in NS 250 mL Stopped (07/26/19 1932)     LOS: 2 days   The patient is critically ill with multiple organ systems failure and requires high complexity decision making for assessment and support, frequent evaluation and titration of therapies, application of advanced monitoring technologies and extensive interpretation of multiple databases. Critical Care Time devoted to patient care services described in this note  Time spent: 40 minutes     Jericha Bryden, Geraldo Docker, MD [facility] Pager [phone]  If 7PM-7AM, please contact night-coverage [email or web address] Password [email or web address] 07/27/2019, 8:02 AM

## 2019-07-27 NOTE — Progress Notes (Signed)
Morning ABG obtained, results as follows: These results were on settings  FiO2 (%):  [60 %] 60 % Set Rate:  [20 bmp] 20 bmp Vt Set:  [510 mL] 510 mL PEEP:  [12 cmH20] 12 cmH20  Patients PaO2 is above the target of 55- 65, plateau pressure < 30 cm H2O and P/F ratio is 1:135.  Holding on placing the patient in prone position again until CCM physician reviews the lab results.  Results for [name], Albert "[name]" (MRN [id number]) as of 07/27/2019 05:57  Ref. Range 07/27/2019 05:46  Sample type Unknown ARTERIAL  pH, Arterial Latest Ref Range: 7.350 - 7.450  7.329 (L)  pCO2 arterial Latest Ref Range: 32.0 - 48.0 mmHg 47.5  pO2, Arterial Latest Ref Range: 83.0 - 108.0 mmHg 81.0 (L)  TCO2 Latest Ref Range: 22 - 32 mmol/L 26  Acid-base deficit Latest Ref Range: 0.0 - 2.0 mmol/L 1.0  Bicarbonate Latest Ref Range: 20.0 - 28.0 mmol/L 25.0  O2 Saturation Latest Units: % 95.0  Patient temperature Unknown 37.0 C  Collection site Unknown ARTERIAL LINE

## 2019-07-27 NOTE — Progress Notes (Signed)
Per Dr. Jimmy Footman there is no need to plan to prone patient again this morning with current ABG results; P/F ratio 1:201 and PaO2 121.   Will pull ABG in the early AM to reassess.

## 2019-07-27 NOTE — Progress Notes (Signed)
RT and RN turned patient's head to the left and rotated his arms.  No complications. ETT remains secured at 28.

## 2019-07-27 NOTE — Progress Notes (Signed)
Called pt's spouse to update of pt condition and plan of care. Pt's spouse appreciative of update.

## 2019-07-27 NOTE — Progress Notes (Signed)
NAME:  [name], MRN:  [id number], DOB:  1951/03/28, LOS: 2 ADMISSION DATE:  07/25/2019, CONSULTATION DATE:  10/3 REFERRING MD:  Bridgett Larsson, CHIEF COMPLAINT:  Dyspnea   Brief History   68 y/o male admitted on 10/3 from the [facility] ED with ARDS from COVID pneumonia leading to need for mechanical ventilation.    Past Medical History  COVID 19 Heartburn  Significant Hospital Events   10/3 admission  Consults:  PCCM  Procedures:  10/2 ETT>  10/2 R IJ CVL >   Significant Diagnostic Tests:  10/2 CT angiogram > personally reviewed, extensive bilateral airspace disease predominantly posterior  Micro Data:  9/20 SARS COV 2 > POSITIVE 10/2 blood >    Antimicrobials/COVID Rx  10/3 remdesivir > 10/3 actemra 10/3 decadron > 10/4 convalescent plasma  Interim history/subjective:   Oxygenation improved, PEEP/FiO2 weaned overnight To receive convalescent plasma today  Objective   Blood pressure 122/69, pulse (!) 56, temperature 99.1 F (37.3 C), temperature source Bladder, resp. rate 20, height 5\' 10"  (1.778 m), weight 92.6 kg, SpO2 95 %. CVP:  [13 mmHg] 13 mmHg  Vent Mode: PRVC FiO2 (%):  [60 %-90 %] 60 % Set Rate:  [20 bmp] 20 bmp Vt Set:  [510 mL-580 mL] 510 mL PEEP:  [8 cmH20-16 cmH20] 12 cmH20 Plateau Pressure:  [17 cmH20-30 cmH20] 23 cmH20   Intake/Output Summary (Last 24 hours) at 07/27/2019 0745 Last data filed at 07/27/2019 0700 Gross per 24 hour  Intake 3209.22 ml  Output 1575 ml  Net 1634.22 ml   Filed Weights   07/25/19 1635 07/26/19 0446 07/27/19 0500  Weight: 96 kg 92.8 kg 92.6 kg    Examination:  General:  In bed on vent HENT: NCAT ETT in place PULM: Crackles bases B, vent supported breathing CV: RRR, no mgr GI: BS+, soft, nontender MSK: normal bulk and tone Neuro: sedated on vent   Resolved Hospital Problem list     Assessment & Plan:  Severe ARDS due to COVID 19 pneumonia Decadron 10 days Remdesivir 5 days Convalescent plasma today Continue  mechanical ventilation per ARDS protocol Target TVol 6-8cc/kgIBW Target Plateau Pressure < 30cm H20 Target driving pressure less than 15 cm of water Target PaO2 55-65: titrate PEEP/FiO2 per protocol As long as PaO2 to FiO2 ratio is less than 1:150 position in prone position for 16 hours a day Check CVP daily if CVL in place Target CVP less than 4, diurese as necessary Ventilator associated pneumonia prevention protocol 10/4 plan: back to prone position  Anticoagulation/DVT prevention in setting of thrombophilia from COVID 19 Family reviewing [place] PACT trial documentation  Need for sedation/mechanical ventilation Intermittent paralytic protocol RASS goal -4 to -5 Fentanyl drip Versed drip   Best practice:  Diet: tube feeding, start today Pain/Anxiety/Delirium protocol (if indicated): as above VAP protocol (if indicated): yes DVT prophylaxis: as above GI prophylaxis: famotidine Glucose control: SSI Mobility: bed rest Code Status: full Family Communication: I called his wife [name] on 10/4 and gave her an update Disposition: remain in ICU  Labs   CBC: Recent Labs  Lab 07/25/19 1656  07/26/19 0425 07/26/19 0809 07/26/19 1222 07/27/19 0023 07/27/19 0231 07/27/19 0546  WBC 10.2  --  14.0*  --   --   --  16.4*  --   NEUTROABS 9.0*  --  12.9*  --   --   --  15.2*  --   HGB 13.2   < > 11.7* 11.2* 11.6* 12.2* 12.4* 10.9*  HCT 40.1   < >  36.0* 33.0* 34.0* 36.0* 38.6* 32.0*  MCV 92.4  --  93.3  --   --   --  94.8  --   PLT 377  --  300  --   --   --  437*  --    < > = values in this interval not displayed.    Basic Metabolic Panel: Recent Labs  Lab 07/25/19 1656  07/26/19 0425 07/26/19 0809 07/26/19 1222 07/27/19 0023 07/27/19 0231 07/27/19 0546  NA 134*   < > 138 138 141 143 144 146*  K 3.9   < > 4.3 4.1 4.6 4.4 4.5 4.0  CL 97*  --  102  --   --   --  111  --   CO2 24  --  27  --   --   --  24  --   GLUCOSE 131*  --  188*  --   --   --  214*  --   BUN 26*  --   31*  --   --   --  49*  --   CREATININE 1.51*  --  1.35*  --   --   --  1.95*  --   CALCIUM 8.4*  --  8.0*  --   --   --  8.4*  --   MG  --   --  2.7*  --   --   --  3.2*  --   PHOS  --   --  4.1  --   --   --  3.4  --    < > = values in this interval not displayed.   GFR: Estimated Creatinine Clearance: 42.6 mL/min (A) (by C-G formula based on SCr of 1.95 mg/dL (H)). Recent Labs  Lab 07/25/19 1656 07/25/19 2005 07/26/19 0425 07/27/19 0231  PROCALCITON 3.62  --   --   --   WBC 10.2  --  14.0* 16.4*  LATICACIDVEN 2.2* 1.2  --   --     Liver Function Tests: Recent Labs  Lab 07/25/19 1656 07/26/19 0425 07/27/19 0231  AST 52* 47* 30  ALT 31 32 29  ALKPHOS 54 49 66  BILITOT 0.9 0.8 0.6  PROT 7.2 6.3* 6.8  ALBUMIN 3.1* 2.5* 2.6*   No results for input(s): LIPASE, AMYLASE in the last 168 hours. No results for input(s): AMMONIA in the last 168 hours.  ABG    Component Value Date/Time   PHART 7.329 (L) 07/27/2019 0546   PCO2ART 47.5 07/27/2019 0546   PO2ART 81.0 (L) 07/27/2019 0546   HCO3 25.0 07/27/2019 0546   TCO2 26 07/27/2019 0546   ACIDBASEDEF 1.0 07/27/2019 0546   O2SAT 95.0 07/27/2019 0546     Coagulation Profile: No results for input(s): INR, PROTIME in the last 168 hours.  Cardiac Enzymes: No results for input(s): CKTOTAL, CKMB, CKMBINDEX, TROPONINI in the last 168 hours.  HbA1C: Hgb A1c MFr Bld  Date/Time Value Ref Range Status  07/26/2019 05:00 AM 6.1 (H) 4.8 - 5.6 % Final    Comment:    (NOTE) Pre diabetes:          5.7%-6.4% Diabetes:              >6.4% Glycemic control for   <7.0% adults with diabetes     CBG: Recent Labs  Lab 07/26/19 1245 07/26/19 1638 07/26/19 1945 07/26/19 2356 07/27/19 0419  GLUCAP 125* 138* 148* 189* 190*     Critical care time: 35  minutes     Roselie Awkward, MD [place] [facility] Pager: [phone] Cell: [phone] If no response, call [phone]

## 2019-07-27 NOTE — Progress Notes (Signed)
Nutrition Follow-up  INTERVENTION:   Monitor magnesium, potassium, and phosphorus daily for at least 3 days, MD to replete as needed, as pt is at risk for refeeding syndrome.  -Switch formula to Vital 1.5, initiate at 40 ml/hr and advance by 10 ml every 8 hours to goal rate of 55 ml/hr.  -Advance 30 ml Prostat to TID -Provides 2280 kcal, 134g protein and 1008 ml H2O.  NUTRITION DIAGNOSIS:   Inadequate oral intake related to inability to eat(pt sedated and ventilated) as evidenced by NPO status.  Ongoing.  GOAL:   Provide needs based on ASPEN/SCCM guidelines  Progressing.  MONITOR:   Vent status, Labs, Weight trends, TF tolerance, Skin, I & O's  REASON FOR ASSESSMENT:   Consult Enteral/tube feeding initiation and management  ASSESSMENT:   68 y/o male admitted with PNA and COVID 68  **RD working remotely**  Patient is currently intubated on ventilator support MV: 10.1 L/min Temp (24hrs), Avg:98.3 F (36.8 C), Min:97.2 F (36.2 C), Max:99.3 F (37.4 C)  Patient currently receiving Vital HP @ 40 ml/hr with 30 ml Prostat BID.  Initial nutrition assessment completed 10/3. Vital 1.5 was ordered but then was discontinued via MD and TF protocol was ordered.  Will adjust TF to better meet estimated needs.  Admission weight: 211 lbs. Current weight: 204 lbs. I/Os: +2.3L since admit UOP 10/3: 1575 ml  Medications: Levophed infusion, Versed infusion Labs reviewed: CBGs: 171-190 Elevated Na  Diet Order:   Diet Order            Diet NPO time specified  Diet effective now              EDUCATION NEEDS:   No education needs have been identified at this time  Skin:  Skin Assessment: Reviewed RN Assessment  Last BM:  PTA  Height:   Ht Readings from Last 1 Encounters:  07/26/19 5\' 10"  (1.778 m)    Weight:   Wt Readings from Last 1 Encounters:  07/27/19 92.6 kg    Ideal Body Weight:  75.45 kg  BMI:  Body mass index is 29.29 kg/m.  Estimated  Nutritional Needs:   Kcal:  1992  Protein:  125-135g  Fluid:  >1.9L/day  Clayton Bibles, MS, RD, LDN Inpatient Clinical Dietitian Pager: [phone] After Hours Pager: [phone]

## 2019-07-28 DIAGNOSIS — J9601 Acute respiratory failure with hypoxia: Secondary | ICD-10-CM | POA: Diagnosis not present

## 2019-07-28 DIAGNOSIS — N179 Acute kidney failure, unspecified: Secondary | ICD-10-CM | POA: Diagnosis not present

## 2019-07-28 DIAGNOSIS — E8770 Fluid overload, unspecified: Secondary | ICD-10-CM | POA: Diagnosis not present

## 2019-07-28 DIAGNOSIS — N400 Enlarged prostate without lower urinary tract symptoms: Secondary | ICD-10-CM | POA: Diagnosis not present

## 2019-07-28 DIAGNOSIS — I4891 Unspecified atrial fibrillation: Secondary | ICD-10-CM | POA: Diagnosis not present

## 2019-07-28 DIAGNOSIS — Z9689 Presence of other specified functional implants: Secondary | ICD-10-CM | POA: Diagnosis not present

## 2019-07-28 DIAGNOSIS — J1289 Other viral pneumonia: Secondary | ICD-10-CM | POA: Diagnosis not present

## 2019-07-28 DIAGNOSIS — U071 COVID-19: Secondary | ICD-10-CM | POA: Diagnosis not present

## 2019-07-28 DIAGNOSIS — J9600 Acute respiratory failure, unspecified whether with hypoxia or hypercapnia: Secondary | ICD-10-CM | POA: Diagnosis not present

## 2019-07-28 DIAGNOSIS — K922 Gastrointestinal hemorrhage, unspecified: Secondary | ICD-10-CM | POA: Diagnosis not present

## 2019-07-28 LAB — CBC WITH DIFFERENTIAL/PLATELET
Abs Immature Granulocytes: 0.19 10*3/uL — ABNORMAL HIGH (ref 0.00–0.07)
Basophils Absolute: 0 10*3/uL (ref 0.0–0.1)
Basophils Relative: 0 %
Eosinophils Absolute: 0 10*3/uL (ref 0.0–0.5)
Eosinophils Relative: 0 %
HCT: 36.9 % — ABNORMAL LOW (ref 39.0–52.0)
Hemoglobin: 11.5 g/dL — ABNORMAL LOW (ref 13.0–17.0)
Immature Granulocytes: 1 %
Lymphocytes Relative: 3 %
Lymphs Abs: 0.5 10*3/uL — ABNORMAL LOW (ref 0.7–4.0)
MCH: 30.3 pg (ref 26.0–34.0)
MCHC: 31.2 g/dL (ref 30.0–36.0)
MCV: 97.1 fL (ref 80.0–100.0)
Monocytes Absolute: 0.5 10*3/uL (ref 0.1–1.0)
Monocytes Relative: 3 %
Neutro Abs: 13.6 10*3/uL — ABNORMAL HIGH (ref 1.7–7.7)
Neutrophils Relative %: 93 %
Platelets: 450 10*3/uL — ABNORMAL HIGH (ref 150–400)
RBC: 3.8 MIL/uL — ABNORMAL LOW (ref 4.22–5.81)
RDW: 15.2 % (ref 11.5–15.5)
WBC: 14.8 10*3/uL — ABNORMAL HIGH (ref 4.0–10.5)
nRBC: 0.3 % — ABNORMAL HIGH (ref 0.0–0.2)

## 2019-07-28 LAB — COMPREHENSIVE METABOLIC PANEL
ALT: 27 U/L (ref 0–44)
AST: 25 U/L (ref 15–41)
Albumin: 2.5 g/dL — ABNORMAL LOW (ref 3.5–5.0)
Alkaline Phosphatase: 56 U/L (ref 38–126)
Anion gap: 9 (ref 5–15)
BUN: 56 mg/dL — ABNORMAL HIGH (ref 8–23)
CO2: 29 mmol/L (ref 22–32)
Calcium: 8.5 mg/dL — ABNORMAL LOW (ref 8.9–10.3)
Chloride: 113 mmol/L — ABNORMAL HIGH (ref 98–111)
Creatinine, Ser: 1.75 mg/dL — ABNORMAL HIGH (ref 0.61–1.24)
GFR calc Af Amer: 46 mL/min — ABNORMAL LOW (ref >60)
GFR calc non Af Amer: 40 mL/min — ABNORMAL LOW (ref >60)
Glucose, Bld: 157 mg/dL — ABNORMAL HIGH (ref 70–99)
Potassium: 4.6 mmol/L (ref 3.5–5.1)
Sodium: 151 mmol/L — ABNORMAL HIGH (ref 135–145)
Total Bilirubin: 0.6 mg/dL (ref 0.3–1.2)
Total Protein: 6.2 g/dL — ABNORMAL LOW (ref 6.5–8.1)

## 2019-07-28 LAB — POCT I-STAT 7, (LYTES, BLD GAS, ICA,H+H)
Acid-Base Excess: 4 mmol/L — ABNORMAL HIGH (ref 0.0–2.0)
Acid-Base Excess: 4 mmol/L — ABNORMAL HIGH (ref 0.0–2.0)
Acid-Base Excess: 4 mmol/L — ABNORMAL HIGH (ref 0.0–2.0)
Bicarbonate: 30.3 mmol/L — ABNORMAL HIGH (ref 20.0–28.0)
Bicarbonate: 29.9 mmol/L — ABNORMAL HIGH (ref 20.0–28.0)
Bicarbonate: 30.4 mmol/L — ABNORMAL HIGH (ref 20.0–28.0)
Calcium, Ion: 1.37 mmol/L (ref 1.15–1.40)
Calcium, Ion: 1.26 mmol/L (ref 1.15–1.40)
Calcium, Ion: 1.27 mmol/L (ref 1.15–1.40)
HCT: 31 % — ABNORMAL LOW (ref 39.0–52.0)
HCT: 33 % — ABNORMAL LOW (ref 39.0–52.0)
HCT: 33 % — ABNORMAL LOW (ref 39.0–52.0)
Hemoglobin: 10.5 g/dL — ABNORMAL LOW (ref 13.0–17.0)
Hemoglobin: 11.2 g/dL — ABNORMAL LOW (ref 13.0–17.0)
Hemoglobin: 11.2 g/dL — ABNORMAL LOW (ref 13.0–17.0)
O2 Saturation: 97 %
O2 Saturation: 93 %
O2 Saturation: 93 %
Patient temperature: 37.1
Patient temperature: 36.7
Patient temperature: 36.7
Potassium: 4.3 mmol/L (ref 3.5–5.1)
Potassium: 4.6 mmol/L (ref 3.5–5.1)
Potassium: 4.6 mmol/L (ref 3.5–5.1)
Sodium: 147 mmol/L — ABNORMAL HIGH (ref 135–145)
Sodium: 150 mmol/L — ABNORMAL HIGH (ref 135–145)
Sodium: 149 mmol/L — ABNORMAL HIGH (ref 135–145)
TCO2: 32 mmol/L (ref 22–32)
TCO2: 31 mmol/L (ref 22–32)
TCO2: 32 mmol/L (ref 22–32)
pCO2 arterial: 52.9 mmHg — ABNORMAL HIGH (ref 32.0–48.0)
pCO2 arterial: 47.8 mmHg (ref 32.0–48.0)
pCO2 arterial: 51.2 mmHg — ABNORMAL HIGH (ref 32.0–48.0)
pH, Arterial: 7.367 (ref 7.350–7.450)
pH, Arterial: 7.403 (ref 7.350–7.450)
pH, Arterial: 7.38 (ref 7.350–7.450)
pO2, Arterial: 94 mmHg (ref 83.0–108.0)
pO2, Arterial: 67 mmHg — ABNORMAL LOW (ref 83.0–108.0)
pO2, Arterial: 70 mmHg — ABNORMAL LOW (ref 83.0–108.0)

## 2019-07-28 LAB — GLUCOSE, CAPILLARY
Glucose-Capillary: 149 mg/dL — ABNORMAL HIGH (ref 70–99)
Glucose-Capillary: 163 mg/dL — ABNORMAL HIGH (ref 70–99)
Glucose-Capillary: 168 mg/dL — ABNORMAL HIGH (ref 70–99)
Glucose-Capillary: 176 mg/dL — ABNORMAL HIGH (ref 70–99)
Glucose-Capillary: 185 mg/dL — ABNORMAL HIGH (ref 70–99)
Glucose-Capillary: 87 mg/dL (ref 70–99)

## 2019-07-28 LAB — PREPARE FRESH FROZEN PLASMA: Unit division: 0

## 2019-07-28 LAB — MAGNESIUM: Magnesium: 3.1 mg/dL — ABNORMAL HIGH (ref 1.7–2.4)

## 2019-07-28 LAB — BPAM FFP
Blood Product Expiration Date: 202010050210
ISSUE DATE / TIME: 202010040236
Unit Type and Rh: 5100

## 2019-07-28 LAB — C-REACTIVE PROTEIN: CRP: 8.3 mg/dL — ABNORMAL HIGH (ref <1.0)

## 2019-07-28 LAB — PHOSPHORUS: Phosphorus: 3.2 mg/dL (ref 2.5–4.6)

## 2019-07-28 LAB — D-DIMER, QUANTITATIVE: D-Dimer, Quant: >20 ug/mL-FEU — ABNORMAL HIGH (ref 0.00–0.50)

## 2019-07-28 MED ORDER — ENOXAPARIN SODIUM 40 MG/0.4ML ~~LOC~~ SOLN
40.0000 mg | Freq: Two times a day (BID) | SUBCUTANEOUS | Status: DC
  Administered 2019-07-28 – 2019-07-31 (×6): 40 mg via SUBCUTANEOUS
  Filled 2019-07-28 (×6): qty 0.4

## 2019-07-28 NOTE — Progress Notes (Signed)
Patien's head turned to the right with nursing assistance. No complications.  ETT secure.

## 2019-07-28 NOTE — Progress Notes (Signed)
PROGRESS NOTE    [name]  JKD:[id number] DOB: 1951-08-04 DOA: 07/25/2019 PCP: Kathyrn Drown, MD   Brief Narrative:  68 year old BM PMHx GERD, BPH   Presented to [facility] ER with shortness of breath and hypoxia.  Patient was diagnosed with COVID 19 on July 13 9019.  Patient's wife also has COVID-19 but is asymptomatic.  Over the last 1 to 2 days, the patient's shortness of breath is increased.  Patient has been suffering with anorexia.  Patient's wife was actually having a conversation via teleconference with the patient's PCP.  PCP is also otherwise physician.  He had actually called to check on how the wife was doing.  The wife had mentioned to the physician that the patient has been laying in bed.  When the physician listened to how the patient was speaking over the phone, it was noted the patient was tachypneic.  EMS was activated.  When EMS arrived, the patient's O2 saturations were apparently in the 40 percentile range.  Patient was placed on nonrebreather.  He was transferred to the ER.  In the ER, his oxygen saturations on 100% nonrebreather were only in the 80s.  Patient was confused.  Patient was emergently intubated.  Patient given IV steroids.  Initial chest x-ray demonstrates diffuse multifocal infiltrates.  CT scan of the chest was negative for PE.  ABG prior to leaving for [place] demonstrates a pH 7.41 PCO2 44 PO2 of 87 on FiO2 100%.  AA gradient calculates out to 571.  I personally called the patient's wife on the evening of July 25, 2019.  During that conversation we had discussed using IV steroids, IV remdesivir.  We also discussed off label use of Actemra and possibly confluence of plasma due to the patient's severe COVID-19 pneumonia.  She verbally agreed that the medical team should try anything possible to make the patient better.  Confirm with the wife the patient is a full code.    ED Course: emergently intubated. Central line placed. Started on  IV Decadron.    Subjective: 10/5 somnolent, prone, sedated, last 24 hours afebrile   Assessment & Plan:   Principal Problem:   Pneumonia due to COVID-19 virus Active Problems:   BPH (benign prostatic hyperplasia)   Acute respiratory failure due to COVID-19 (HCC)   AKI (acute kidney injury) (HCC)   Fluid overload  COVID pneumonia/acute respiratory stress with hypoxia - Continue Decadron - Continue Remdesivir per pharmacy protocol - 10/2 Actemra x1 dose - 10/3 counseled patient wife on CCP over phone and she has agreed to allow [place] to administer CCP.  Paperwork in chart - Continue prone patient> 16 hours/day if patient tolerates.  If not prone patient for 2 to 3 hours every shift. -10/3 patient enrolled in [place]  Lab 07/25/19 1656 07/26/19 0425 07/27/19 0231  CRP 19.2* 18.0* 16.6*   Recent Labs  Lab 07/25/19 1656 07/26/19 0425 07/27/19 0231  DDIMER >20.00* >20.00* >20.00*  - Titrate patient's vent settings to maintain SPO2 88 to 93%, if possible.  Will use ARDS lung protective protocol (6 to 8 cc/kg/min). - Attempt to maintain plateau pressure<30 - Attempt to maintain driving pressure 15 - 10/5 PF ratio= 170 while prone - 10/6 PCXR pending  Fluid overload - Strict in and out +2.2 L -Daily weight Filed Weights   07/26/19 0446 07/27/19 0500 07/28/19 0400  Weight: 92.8 kg 92.6 kg 94 kg  - CVP daily - 10/5 CVP= 12 -10/4 Lasix  IV 40 mg x 2 doses  Acute kidney injury - Avoid nephrotoxic medication Recent Labs  Lab 07/25/19 1656 07/26/19 0425 07/27/19 0231  CREATININE 1.51* 1.35* 1.95*  -Monitor closely trended up today; received Lasix 10/4 secondary to fluid overload. -10/5 although CVP remains elevated would not repeat Lasix dose at this time.  BPH -Not on home meds.  But be cognizant of urinary retention    Goals of care - CCP use; counseled Mrs. Hirschman that CCP is not improving therapy for COVID-19 though available data shows  it may have benefit with high titer plasma is given.  Is unclear if it is effective other patients and with use of low titer plasma.  Risk and benefits of its use were explained to Mrs. Tow and she agreed to treatment..  All appropriate IRB consent paperwork has been signed and is in patient's chart.   DVT prophylaxis: Lovenox (ICU dose) Code Status: Full Family Communication: 10/5 Dr. Kathalene Frames spoke with family Disposition Plan: TBD   Consultants:  PCCM   Procedures/Significant Events:  10/2 PCXR;-Endotracheal tube remains with tip above the thoracic inlet. Recommend repositioning and advancement. Esophagogastric tube with tip and side port below the diaphragm. - Unchanged diffuse, extensive bilateral heterogeneous opacity, consistent with multifocal infection, edema, and/or ARDS. 10/3 PCXR; slightly improved extensive bilateral heterogeneous opacifications (my read)      I have personally reviewed and interpreted all radiology studies and my findings are as above.  VENTILATOR SETTINGS: Vent mode; PRVC Vt Set; 510 Set rate; 20 FiO2; 45 I time; 0.9 PEEP; 12   Cultures 9/29 Novel coronavirus positive 10/2 blood LEFT antecubital NGTD 10/2 blood hand NGTD 10/3 MRSA by PCR negative     Antimicrobials: Anti-infectives (From admission, onward)   Start     Stop   07/26/19 1600  remdesivir 100 mg in sodium chloride 0.9 % 250 mL IVPB     07/30/19 1559   07/26/19 0215  remdesivir 200 mg in sodium chloride 0.9 % 250 mL IVPB     07/26/19 0520       Devices    LINES / TUBES:  #7.5 ETT 10/2>> RIGHT IJ CVL>>>   Continuous Infusions: . sodium chloride    . feeding supplement (VITAL 1.5 CAL) 1,000 mL (07/27/19 1204)  . fentaNYL infusion INTRAVENOUS 150 mcg/hr (07/28/19 0700)  . midazolam 4 mg/hr (07/28/19 0700)  . norepinephrine (LEVOPHED) Adult infusion 2 mcg/min (07/28/19 0700)  . propofol (DIPRIVAN) infusion Stopped (07/26/19 0402)  . remdesivir 100 mg  in NS 250 mL Stopped (07/27/19 1718)     Objective: Vitals:   07/28/19 0400 07/28/19 0500 07/28/19 0600 07/28/19 0700  BP: 134/77 109/61 113/65 131/75  Pulse: (!) 57 (!) 53 (!) 55 61  Resp: _0 Temp: 98.8 F (37.1 C) 99.1 F (37.3 C) 99.1 F (37.3 C)   TempSrc:  Bladder    SpO2: 96% 94% 95% 95%  Weight: 94 kg     Height:        Intake/Output Summary (Last 24 hours) at 07/28/2019 0730 Last data filed at 07/28/2019 0700 Gross per 24 hour  Intake 2258.51 ml  Output 2310 ml  Net -51.49 ml   Filed Weights   07/26/19 0446 07/27/19 0500 07/28/19 0400  Weight: 92.8 kg 92.6 kg 94 kg   Physical Exam:  General: sedated/intubated, prone, positive acute respiratory distress Eyes: negative scleral hemorrhage, negative anisocoria, negative icterus ENT: Negative Runny nose, negative gingival bleeding, #7.5 ETT tube in place  Neck:  Negative scars, masses, torticollis, lymphadenopathy, JVD, RIGHT IJ CVL in place negative sign of infection Lungs: Clear to auscultation bilaterally without wheezes or crackles Cardiovascular: Regular rate and rhythm without murmur gallop or rub normal S1 and S2 Abdomen: negative abdominal pain, nondistended, positive soft, bowel sounds, no rebound, no ascites, no appreciable mass Extremities: No significant cyanosis, clubbing, or edema bilateral lower extremities Skin: Negative rashes, lesions, ulcers Psychiatric: Unable to evaluate sedated/intubated   Central nervous system: Unable to evaluate sedated/intubated          Data Reviewed: Care during the described time interval was provided by me .  I have reviewed this patient's available data, including medical history, events of note, physical examination, and all test results as part of my evaluation.   CBC: Recent Labs  Lab 07/25/19 1656  07/26/19 0425  07/27/19 0023 07/27/19 0231 07/27/19 0546 07/27/19 1836 07/28/19 0402  WBC 10.2  --  14.0*  --   --  16.4*  --   --   --    NEUTROABS 9.0*  --  12.9*  --   --  15.2*  --   --   --   HGB 13.2   < > 11.7*   < > 12.2* 12.4* 10.9* 11.6* 10.5*  HCT 40.1   < > 36.0*   < > 36.0* 38.6* 32.0* 34.0* 31.0*  MCV 92.4  --  93.3  --   --  94.8  --   --   --   PLT 377  --  300  --   --  437*  --   --   --    < > = values in this interval not displayed.   Basic Metabolic Panel: Recent Labs  Lab 07/25/19 1656  07/26/19 0425  07/27/19 0023 07/27/19 0231 07/27/19 0546 07/27/19 1836 07/28/19 0402  NA 134*   < > 138   < > 143 144 146* 146* 147*  K 3.9   < > 4.3   < > 4.4 4.5 4.0 4.5 4.3  CL 97*  --  102  --   --  111  --   --   --   CO2 24  --  27  --   --  24  --   --   --   GLUCOSE 131*  --  188*  --   --  214*  --   --   --   BUN 26*  --  31*  --   --  49*  --   --   --   CREATININE 1.51*  --  1.35*  --   --  1.95*  --   --   --   CALCIUM 8.4*  --  8.0*  --   --  8.4*  --   --   --   MG  --   --  2.7*  --   --  3.2*  --   --   --   PHOS  --   --  4.1  --   --  3.4  --   --   --    < > = values in this interval not displayed.   GFR: Estimated Creatinine Clearance: 42.9 mL/min (A) (by C-G formula based on SCr of 1.95 mg/dL (H)). Liver Function Tests: Recent Labs  Lab 07/25/19 1656 07/26/19 0425 07/27/19 0231  AST 52* 47* 30  ALT 31 32 29  ALKPHOS 54 49 66  BILITOT  0.9 0.8 0.6  PROT 7.2 6.3* 6.8  ALBUMIN 3.1* 2.5* 2.6*   No results for input(s): LIPASE, AMYLASE in the last 168 hours. No results for input(s): AMMONIA in the last 168 hours. Coagulation Profile: No results for input(s): INR, PROTIME in the last 168 hours. Cardiac Enzymes: No results for input(s): CKTOTAL, CKMB, CKMBINDEX, TROPONINI in the last 168 hours. BNP (last 3 results) No results for input(s): PROBNP in the last 8760 hours. HbA1C: Recent Labs    07/26/19 0500  HGBA1C 6.1*   CBG: Recent Labs  Lab 07/27/19 1151 07/27/19 1715 07/27/19 1958 07/27/19 2308 07/28/19 0439  GLUCAP 147* 170* 143* 168* 149*   Lipid Profile: Recent  Labs    07/25/19 1656  TRIG 99   Thyroid Function Tests: No results for input(s): TSH, T4TOTAL, FREET4, T3FREE, THYROIDAB in the last 72 hours. Anemia Panel: Recent Labs    07/25/19 1656 07/26/19 0425  FERRITIN 1,417* 1,251*   Urine analysis:    Component Value Date/Time   COLORURINE YELLOW 07/25/2019 1713   APPEARANCEUR HAZY (A) 07/25/2019 1713   LABSPEC 1.027 07/25/2019 1713   PHURINE 5.0 07/25/2019 1713   GLUCOSEU NEGATIVE 07/25/2019 1713   HGBUR MODERATE (A) 07/25/2019 1713   BILIRUBINUR NEGATIVE 07/25/2019 1713   KETONESUR NEGATIVE 07/25/2019 1713   PROTEINUR 100 (A) 07/25/2019 1713   NITRITE NEGATIVE 07/25/2019 1713   LEUKOCYTESUR NEGATIVE 07/25/2019 1713   Sepsis Labs: _0 (procalcitonin:4,lacticidven:4)  ) Recent Results (from the past 240 hour(s))  Novel Coronavirus, NAA ([facility])     Status: Abnormal   Collection Time: 07/22/19 12:00 AM   Specimen: Oropharyngeal(OP) collection in vial transport medium   OROPHARYNGEA  TESTING  Result Value Ref Range Status   SARS-CoV-2, NAA Detected (A) Not Detected Final    Comment: This nucleic acid amplification test was developed and its performance characteristics determined by [employer]. Nucleic acid amplification tests include PCR and TMA. This test has not been FDA cleared or approved. This test has been authorized by FDA under an Emergency Use Authorization (EUA). This test is only authorized for the duration of time the declaration that circumstances exist justifying the authorization of the emergency use of in vitro diagnostic tests for detection of SARS-CoV-2 virus and/or diagnosis of COVID-19 infection under section 564(b)(1) of the Act, 21 U.S.C. 355DDU-2(G) (1), unless the authorization is terminated or revoked sooner. When diagnostic testing is negative, the possibility of a false negative result should be considered in the context of a patient's recent exposures and the presence of  clinical signs and symptoms consistent with COVID-19. An individual without symptoms of COVID-19 and who is not shedding SARS-CoV-2 virus would  expect to have a negative (not detected) result in this assay.   Blood Culture (routine x 2)     Status: None (Preliminary result)   Collection Time: 07/25/19  4:56 PM   Specimen: Left Antecubital; Blood  Result Value Ref Range Status   Specimen Description LEFT ANTECUBITAL  Final   Special Requests   Final    BOTTLES DRAWN AEROBIC AND ANAEROBIC Blood Culture results may not be optimal due to an excessive volume of blood received in culture bottles   Culture   Final    NO GROWTH 2 DAYS Performed at [facility], [address] [place],  [address]    Report Status PENDING  Incomplete  Blood Culture (routine x 2)     Status: None (Preliminary result)   Collection Time: 07/25/19  5:01 PM   Specimen:  BLOOD RIGHT HAND  Result Value Ref Range Status   Specimen Description BLOOD RIGHT HAND  Final   Special Requests   Final    BOTTLES DRAWN AEROBIC AND ANAEROBIC Blood Culture adequate volume   Culture   Final    NO GROWTH 2 DAYS Performed at [facility], [address] [place], [place] [address]    Report Status PENDING  Incomplete  MRSA PCR Screening     Status: None   Collection Time: 07/26/19  2:09 AM   Specimen: Nasal Mucosa; Nasopharyngeal  Result Value Ref Range Status   MRSA by PCR NEGATIVE NEGATIVE Final    Comment:        The GeneXpert MRSA Assay (FDA approved for NASAL specimens only), is one component of a comprehensive MRSA colonization surveillance program. It is not intended to diagnose MRSA infection nor to guide or monitor treatment for MRSA infections. Performed at [facility], [place] [address] [place], [place] [address]          Radiology Studies: Dg Abd 1 View  Result Date: 07/26/2019 CLINICAL DATA:  Feeding tube placed EXAM: ABDOMEN - 1 VIEW COMPARISON:  None. FINDINGS: The  enteric tube projects over the region of the gastric body/antrum. The tip is pointed distally. The bowel gas pattern is nonspecific and nonobstructive. Airspace disease is again noted and better visualized on prior x-ray. IMPRESSION: Enteric tube tip projects over the gastric body/antrum. Electronically Signed   By: Constance Holster M.D.   On: 07/26/2019 13:24        Scheduled Meds: . sodium chloride   Intravenous Once  . artificial tears  1 application Both Eyes J1B  . chlorhexidine gluconate (MEDLINE KIT)  15 mL Mouth Rinse BID  . Chlorhexidine Gluconate Cloth  6 each Topical Daily  . dexamethasone (DECADRON) injection  6 mg Intravenous Q24H  . enoxaparin (LOVENOX) injection  50 mg Subcutaneous Q12H  . famotidine  20 mg Per Tube BID  . feeding supplement (PRO-STAT SUGAR FREE 64)  30 mL Per Tube TID  . fentaNYL (SUBLIMAZE) injection  25 mcg Intravenous Once  . insulin aspart  0-9 Units Subcutaneous Q4H  . mouth rinse  15 mL Mouth Rinse 10 times per day   Continuous Infusions: . sodium chloride    . feeding supplement (VITAL 1.5 CAL) 1,000 mL (07/27/19 1204)  . fentaNYL infusion INTRAVENOUS 150 mcg/hr (07/28/19 0700)  . midazolam 4 mg/hr (07/28/19 0700)  . norepinephrine (LEVOPHED) Adult infusion 2 mcg/min (07/28/19 0700)  . propofol (DIPRIVAN) infusion Stopped (07/26/19 0402)  . remdesivir 100 mg in NS 250 mL Stopped (07/27/19 1718)     LOS: 3 days   The patient is critically ill with multiple organ systems failure and requires high complexity decision making for assessment and support, frequent evaluation and titration of therapies, application of advanced monitoring technologies and extensive interpretation of multiple databases. Critical Care Time devoted to patient care services described in this note  Time spent: 40 minutes     WOODS, Geraldo Docker, MD [facility] Pager [phone]  If 7PM-7AM, please contact night-coverage [email or web address] Password [email or web address]  07/28/2019, 7:30 AM

## 2019-07-28 NOTE — Progress Notes (Signed)
Pt unproned at 0915.  Removed tape and protective dressings to inspect skin.  No breakdown noted on cheeks, chin, neck, forehead or lips.  Resecured with [place] to left of mouth. Tolerated well.

## 2019-07-28 NOTE — Progress Notes (Signed)
Pt's head turned to left at this time. No complications noted.

## 2019-07-28 NOTE — Progress Notes (Signed)
NAME:  [name], MRN:  [id number], DOB:  20-Aug-1951, LOS: 3 ADMISSION DATE:  07/25/2019, CONSULTATION DATE:  10/3 REFERRING MD:  Bridgett Larsson, CHIEF COMPLAINT:  Dyspnea   Brief History   68 y/o male admitted on 10/3 from the [facility] ED with ARDS from COVID pneumonia leading to need for mechanical ventilation.    Past Medical History  COVID 19 Heartburn  Significant Hospital Events   10/3 admission  Consults:  PCCM  Procedures:  10/2 ETT>  10/2 R IJ CVL >   Significant Diagnostic Tests:  10/2 CT angiogram > personally reviewed, extensive bilateral airspace disease predominantly posterior  Micro Data:  9/20 SARS COV 2 > POSITIVE 10/2 blood >   Antimicrobials/COVID Rx  10/3 remdesivir > 10/3 actemra 10/3 decadron > 10/4 convalescent plasma  Interim history/subjective:   Oxygenation improved in prone position  Objective   Blood pressure 131/75, pulse 61, temperature 99.1 F (37.3 C), resp. rate 20, height 5\' 10"  (1.778 m), weight 94 kg, SpO2 95 %. CVP:  [12 mmHg] 12 mmHg  Vent Mode: PRVC FiO2 (%):  [55 %-60 %] 55 % Set Rate:  [20 bmp] 20 bmp Vt Set:  [510 mL] 510 mL PEEP:  [12 cmH20] 12 cmH20 Plateau Pressure:  [22 cmH20-26 cmH20] 25 cmH20   Intake/Output Summary (Last 24 hours) at 07/28/2019 0755 Last data filed at 07/28/2019 0700 Gross per 24 hour  Intake 2258.51 ml  Output 2310 ml  Net -51.49 ml   Filed Weights   07/26/19 0446 07/27/19 0500 07/28/19 0400  Weight: 92.8 kg 92.6 kg 94 kg    Examination:  General:  In bed on vent, prone position HENT: NCAT ETT in place PULM: CTA B, vent supported breathing CV: difficult to examine due to prone position GI: difficult to examine due to prone position MSK: normal bulk and tone Neuro: sedated on vent   Resolved Hospital Problem list     Assessment & Plan:  Severe ARDS due to COVID 19 pneumonia Decadron 10 days Remdesivir 5 days Continue mechanical ventilation per ARDS protocol Target TVol 6-8cc/kgIBW  Target Plateau Pressure < 30cm H20 Target driving pressure less than 15 cm of water Target PaO2 55-65: titrate PEEP/FiO2 per protocol As long as PaO2 to FiO2 ratio is less than 1:150 position in prone position for 16 hours a day Check CVP daily if CVL in place Target CVP less than 4, diurese as necessary Ventilator associated pneumonia prevention protocol 10/5 furosemide, check ABG in supine position, consider increasing PEEP to 16 again if PaO2 low  Anticoagulation/DVT prevention in setting of thrombophilia from COVID 19 Enrolling in [place] PACT? Continue Lovenox 50mg  sub q bid  Need for sedation/mechanical ventilation Intermittent paralytic protocol RASS goal -4 to -5 Fentanyl drip Versed drip   Best practice:  Diet: tube feeding, start today Pain/Anxiety/Delirium protocol (if indicated): as above VAP protocol (if indicated): yes DVT prophylaxis: as above GI prophylaxis: famotidine Glucose control: SSI Mobility: bed rest Code Status: full Family Communication: I called his wife [name] on 10/5 and gave her an update Disposition: remain in ICU  Labs   CBC: Recent Labs  Lab 07/25/19 1656  07/26/19 0425  07/27/19 0231 07/27/19 0546 07/27/19 1836 07/28/19 0402 07/28/19 0500  WBC 10.2  --  14.0*  --  16.4*  --   --   --  14.8*  NEUTROABS 9.0*  --  12.9*  --  15.2*  --   --   --  13.6*  HGB 13.2   < >  11.7*   < > 12.4* 10.9* 11.6* 10.5* 11.5*  HCT 40.1   < > 36.0*   < > 38.6* 32.0* 34.0* 31.0* 36.9*  MCV 92.4  --  93.3  --  94.8  --   --   --  97.1  PLT 377  --  300  --  437*  --   --   --  450*   < > = values in this interval not displayed.    Basic Metabolic Panel: Recent Labs  Lab 07/25/19 1656  07/26/19 0425  07/27/19 0023 07/27/19 0231 07/27/19 0546 07/27/19 1836 07/28/19 0402  NA 134*   < > 138   < > 143 144 146* 146* 147*  K 3.9   < > 4.3   < > 4.4 4.5 4.0 4.5 4.3  CL 97*  --  102  --   --  111  --   --   --   CO2 24  --  27  --   --  24  --   --   --    GLUCOSE 131*  --  188*  --   --  214*  --   --   --   BUN 26*  --  31*  --   --  49*  --   --   --   CREATININE 1.51*  --  1.35*  --   --  1.95*  --   --   --   CALCIUM 8.4*  --  8.0*  --   --  8.4*  --   --   --   MG  --   --  2.7*  --   --  3.2*  --   --   --   PHOS  --   --  4.1  --   --  3.4  --   --   --    < > = values in this interval not displayed.   GFR: Estimated Creatinine Clearance: 42.9 mL/min (A) (by C-G formula based on SCr of 1.95 mg/dL (H)). Recent Labs  Lab 07/25/19 1656 07/25/19 2005 07/26/19 0425 07/27/19 0231 07/28/19 0500  PROCALCITON 3.62  --   --   --   --   WBC 10.2  --  14.0* 16.4* 14.8*  LATICACIDVEN 2.2* 1.2  --   --   --     Liver Function Tests: Recent Labs  Lab 07/25/19 1656 07/26/19 0425 07/27/19 0231  AST 52* 47* 30  ALT 31 32 29  ALKPHOS 54 49 66  BILITOT 0.9 0.8 0.6  PROT 7.2 6.3* 6.8  ALBUMIN 3.1* 2.5* 2.6*   No results for input(s): LIPASE, AMYLASE in the last 168 hours. No results for input(s): AMMONIA in the last 168 hours.  ABG    Component Value Date/Time   PHART 7.367 07/28/2019 0402   PCO2ART 52.9 (H) 07/28/2019 0402   PO2ART 94.0 07/28/2019 0402   HCO3 30.3 (H) 07/28/2019 0402   TCO2 32 07/28/2019 0402   ACIDBASEDEF 1.0 07/27/2019 0546   O2SAT 97.0 07/28/2019 0402     Coagulation Profile: No results for input(s): INR, PROTIME in the last 168 hours.  Cardiac Enzymes: No results for input(s): CKTOTAL, CKMB, CKMBINDEX, TROPONINI in the last 168 hours.  HbA1C: Hgb A1c MFr Bld  Date/Time Value Ref Range Status  07/26/2019 05:00 AM 6.1 (H) 4.8 - 5.6 % Final    Comment:    (NOTE) Pre diabetes:  5.7%-6.4% Diabetes:              >6.4% Glycemic control for   <7.0% adults with diabetes     CBG: Recent Labs  Lab 07/27/19 1151 07/27/19 1715 07/27/19 1958 07/27/19 2308 07/28/19 0439  GLUCAP 147* 170* 143* 168* 149*     Critical care time: 31 minutes     Roselie Awkward, MD [place] [facility] Pager:  [phone] Cell: [phone] If no response, call [phone]

## 2019-07-28 NOTE — Progress Notes (Signed)
[facility] PCCM  PaO2:FiO2 ratio in supine position 149, will not prone this evening Increase PEEP, wean FiO2  Roselie Awkward, MD [place] [facility] Pager: [phone] Cell: [phone] If no response, call [phone]

## 2019-07-28 NOTE — Progress Notes (Signed)
Called pt's spouse to update of pt condition and plan of care. Pt's wife appreciative of update.

## 2019-07-28 NOTE — Progress Notes (Signed)
Patient's head turned to the left with nursing assistance.  No complications.  ETT secure.

## 2019-07-29 ENCOUNTER — Inpatient Hospital Stay (HOSPITAL_COMMUNITY): Payer: Medicare HMO

## 2019-07-29 DIAGNOSIS — J9600 Acute respiratory failure, unspecified whether with hypoxia or hypercapnia: Secondary | ICD-10-CM | POA: Diagnosis not present

## 2019-07-29 DIAGNOSIS — E8770 Fluid overload, unspecified: Secondary | ICD-10-CM | POA: Diagnosis not present

## 2019-07-29 DIAGNOSIS — Z9689 Presence of other specified functional implants: Secondary | ICD-10-CM | POA: Diagnosis not present

## 2019-07-29 DIAGNOSIS — I4891 Unspecified atrial fibrillation: Secondary | ICD-10-CM | POA: Diagnosis not present

## 2019-07-29 DIAGNOSIS — K922 Gastrointestinal hemorrhage, unspecified: Secondary | ICD-10-CM | POA: Diagnosis not present

## 2019-07-29 DIAGNOSIS — J9601 Acute respiratory failure with hypoxia: Secondary | ICD-10-CM | POA: Diagnosis not present

## 2019-07-29 DIAGNOSIS — N179 Acute kidney failure, unspecified: Secondary | ICD-10-CM | POA: Diagnosis not present

## 2019-07-29 DIAGNOSIS — J1289 Other viral pneumonia: Secondary | ICD-10-CM | POA: Diagnosis not present

## 2019-07-29 DIAGNOSIS — N400 Enlarged prostate without lower urinary tract symptoms: Secondary | ICD-10-CM | POA: Diagnosis not present

## 2019-07-29 DIAGNOSIS — U071 COVID-19: Secondary | ICD-10-CM | POA: Diagnosis not present

## 2019-07-29 LAB — POCT I-STAT 7, (LYTES, BLD GAS, ICA,H+H)
Acid-Base Excess: 4 mmol/L — ABNORMAL HIGH (ref 0.0–2.0)
Acid-Base Excess: 7 mmol/L — ABNORMAL HIGH (ref 0.0–2.0)
Bicarbonate: 29.6 mmol/L — ABNORMAL HIGH (ref 20.0–28.0)
Bicarbonate: 32.5 mmol/L — ABNORMAL HIGH (ref 20.0–28.0)
Calcium, Ion: 1.26 mmol/L (ref 1.15–1.40)
Calcium, Ion: 1.3 mmol/L (ref 1.15–1.40)
HCT: 45 % (ref 39.0–52.0)
HCT: 37 % — ABNORMAL LOW (ref 39.0–52.0)
Hemoglobin: 15.3 g/dL (ref 13.0–17.0)
Hemoglobin: 12.6 g/dL — ABNORMAL LOW (ref 13.0–17.0)
O2 Saturation: 92 %
O2 Saturation: 92 %
Patient temperature: 36
Patient temperature: 36.2
Potassium: 4.4 mmol/L (ref 3.5–5.1)
Potassium: 4.4 mmol/L (ref 3.5–5.1)
Sodium: 150 mmol/L — ABNORMAL HIGH (ref 135–145)
Sodium: 150 mmol/L — ABNORMAL HIGH (ref 135–145)
TCO2: 31 mmol/L (ref 22–32)
TCO2: 34 mmol/L — ABNORMAL HIGH (ref 22–32)
pCO2 arterial: 44 mmHg (ref 32.0–48.0)
pCO2 arterial: 47.4 mmHg (ref 32.0–48.0)
pH, Arterial: 7.431 (ref 7.350–7.450)
pH, Arterial: 7.441 (ref 7.350–7.450)
pO2, Arterial: 60 mmHg — ABNORMAL LOW (ref 83.0–108.0)
pO2, Arterial: 60 mmHg — ABNORMAL LOW (ref 83.0–108.0)

## 2019-07-29 LAB — CBC WITH DIFFERENTIAL/PLATELET
Abs Immature Granulocytes: 0.25 10*3/uL — ABNORMAL HIGH (ref 0.00–0.07)
Basophils Absolute: 0 10*3/uL (ref 0.0–0.1)
Basophils Relative: 0 %
Eosinophils Absolute: 0 10*3/uL (ref 0.0–0.5)
Eosinophils Relative: 0 %
HCT: 37.1 % — ABNORMAL LOW (ref 39.0–52.0)
Hemoglobin: 11.3 g/dL — ABNORMAL LOW (ref 13.0–17.0)
Immature Granulocytes: 2 %
Lymphocytes Relative: 4 %
Lymphs Abs: 0.4 10*3/uL — ABNORMAL LOW (ref 0.7–4.0)
MCH: 30.5 pg (ref 26.0–34.0)
MCHC: 30.5 g/dL (ref 30.0–36.0)
MCV: 100.3 fL — ABNORMAL HIGH (ref 80.0–100.0)
Monocytes Absolute: 0.3 10*3/uL (ref 0.1–1.0)
Monocytes Relative: 2 %
Neutro Abs: 9.7 10*3/uL — ABNORMAL HIGH (ref 1.7–7.7)
Neutrophils Relative %: 92 %
Platelets: 415 10*3/uL — ABNORMAL HIGH (ref 150–400)
RBC: 3.7 MIL/uL — ABNORMAL LOW (ref 4.22–5.81)
RDW: 15.6 % — ABNORMAL HIGH (ref 11.5–15.5)
WBC: 10.7 10*3/uL — ABNORMAL HIGH (ref 4.0–10.5)
nRBC: 0.3 % — ABNORMAL HIGH (ref 0.0–0.2)

## 2019-07-29 LAB — GLUCOSE, CAPILLARY
Glucose-Capillary: 149 mg/dL — ABNORMAL HIGH (ref 70–99)
Glucose-Capillary: 118 mg/dL — ABNORMAL HIGH (ref 70–99)
Glucose-Capillary: 124 mg/dL — ABNORMAL HIGH (ref 70–99)
Glucose-Capillary: 167 mg/dL — ABNORMAL HIGH (ref 70–99)
Glucose-Capillary: 127 mg/dL — ABNORMAL HIGH (ref 70–99)

## 2019-07-29 LAB — COMPREHENSIVE METABOLIC PANEL
ALT: 28 U/L (ref 0–44)
AST: 32 U/L (ref 15–41)
Albumin: 2.5 g/dL — ABNORMAL LOW (ref 3.5–5.0)
Alkaline Phosphatase: 58 U/L (ref 38–126)
Anion gap: 9 (ref 5–15)
BUN: 50 mg/dL — ABNORMAL HIGH (ref 8–23)
CO2: 28 mmol/L (ref 22–32)
Calcium: 8.6 mg/dL — ABNORMAL LOW (ref 8.9–10.3)
Chloride: 115 mmol/L — ABNORMAL HIGH (ref 98–111)
Creatinine, Ser: 1.27 mg/dL — ABNORMAL HIGH (ref 0.61–1.24)
GFR calc Af Amer: >60 mL/min (ref >60)
GFR calc non Af Amer: 58 mL/min — ABNORMAL LOW (ref >60)
Glucose, Bld: 151 mg/dL — ABNORMAL HIGH (ref 70–99)
Potassium: 4.9 mmol/L (ref 3.5–5.1)
Sodium: 152 mmol/L — ABNORMAL HIGH (ref 135–145)
Total Bilirubin: 0.4 mg/dL (ref 0.3–1.2)
Total Protein: 5.8 g/dL — ABNORMAL LOW (ref 6.5–8.1)

## 2019-07-29 LAB — PHOSPHORUS: Phosphorus: 2.8 mg/dL (ref 2.5–4.6)

## 2019-07-29 LAB — C-REACTIVE PROTEIN: CRP: 5.1 mg/dL — ABNORMAL HIGH (ref <1.0)

## 2019-07-29 LAB — MAGNESIUM: Magnesium: 2.9 mg/dL — ABNORMAL HIGH (ref 1.7–2.4)

## 2019-07-29 LAB — D-DIMER, QUANTITATIVE: D-Dimer, Quant: >20 ug/mL-FEU — ABNORMAL HIGH (ref 0.00–0.50)

## 2019-07-29 MED ORDER — MIDAZOLAM BOLUS VIA INFUSION
1.0000 mg | INTRAVENOUS | Status: DC | PRN
  Administered 2019-07-30: 2 mg via INTRAVENOUS
  Administered 2019-07-30: 04:00:00 1 mg via INTRAVENOUS
  Administered 2019-07-30: 2 mg via INTRAVENOUS
  Administered 2019-07-30 – 2019-07-31 (×3): 1 mg via INTRAVENOUS
  Administered 2019-08-03 – 2019-08-12 (×8): 2 mg via INTRAVENOUS
  Filled 2019-07-29: qty 2

## 2019-07-29 MED ORDER — FENTANYL 2500MCG IN NS 250ML (10MCG/ML) PREMIX INFUSION
25.0000 ug/h | INTRAVENOUS | Status: DC
  Administered 2019-07-30: 125 ug/h via INTRAVENOUS
  Administered 2019-07-30: 150 ug/h via INTRAVENOUS
  Administered 2019-07-31 – 2019-08-02 (×3): 100 ug/h via INTRAVENOUS
  Administered 2019-08-03: 125 ug/h via INTRAVENOUS
  Administered 2019-08-04 – 2019-08-05 (×3): 100 ug/h via INTRAVENOUS
  Administered 2019-08-06: 50 ug/h via INTRAVENOUS
  Administered 2019-08-08: 25 ug/h via INTRAVENOUS
  Administered 2019-08-09 – 2019-08-10 (×2): 100 ug/h via INTRAVENOUS
  Administered 2019-08-10: 02:00:00 50 ug/h via INTRAVENOUS
  Administered 2019-08-11: 125 ug/h via INTRAVENOUS
  Administered 2019-08-12 (×2): 175 ug/h via INTRAVENOUS
  Administered 2019-08-13: 12:00:00 200 ug/h via INTRAVENOUS
  Filled 2019-07-29 (×16): qty 250

## 2019-07-29 MED ORDER — FENTANYL BOLUS VIA INFUSION
25.0000 ug | INTRAVENOUS | Status: DC | PRN
  Administered 2019-07-30 – 2019-08-08 (×9): 25 ug via INTRAVENOUS
  Administered 2019-08-11: 50 ug via INTRAVENOUS
  Administered 2019-08-11 – 2019-08-13 (×8): 25 ug via INTRAVENOUS
  Filled 2019-07-29: qty 25

## 2019-07-29 MED ORDER — TAMSULOSIN HCL 0.4 MG PO CAPS
0.4000 mg | ORAL_CAPSULE | Freq: Two times a day (BID) | ORAL | Status: DC
  Administered 2019-07-29 – 2019-08-15 (×33): 0.4 mg via ORAL
  Filled 2019-07-29 (×37): qty 1

## 2019-07-29 MED ORDER — FENTANYL CITRATE (PF) 100 MCG/2ML IJ SOLN
25.0000 ug | Freq: Once | INTRAMUSCULAR | Status: AC
  Administered 2019-07-29: 18:00:00 25 ug via INTRAVENOUS

## 2019-07-29 MED ORDER — FUROSEMIDE 10 MG/ML IJ SOLN
40.0000 mg | Freq: Four times a day (QID) | INTRAMUSCULAR | Status: AC
  Administered 2019-07-29 (×2): 40 mg via INTRAVENOUS
  Filled 2019-07-29 (×2): qty 4

## 2019-07-29 MED ORDER — SODIUM CHLORIDE 0.9% IV SOLUTION
Freq: Once | INTRAVENOUS | Status: AC
  Administered 2019-07-30: 01:00:00 via INTRAVENOUS

## 2019-07-29 MED ORDER — POLYETHYLENE GLYCOL 3350 17 G PO PACK
17.0000 g | PACK | Freq: Every day | ORAL | Status: DC
  Administered 2019-07-29 – 2019-07-30 (×2): 17 g
  Filled 2019-07-29 (×2): qty 1

## 2019-07-29 MED ORDER — MIDAZOLAM 50MG/50ML (1MG/ML) PREMIX INFUSION
0.0000 mg/h | INTRAVENOUS | Status: DC
  Administered 2019-07-29: 2 mg/h via INTRAVENOUS
  Administered 2019-07-30: 4 mg/h via INTRAVENOUS
  Administered 2019-07-30 (×2): 3 mg/h via INTRAVENOUS
  Administered 2019-07-31: 1 mg/h via INTRAVENOUS
  Administered 2019-08-01 – 2019-08-05 (×5): 2 mg/h via INTRAVENOUS
  Administered 2019-08-08 (×4): 1 mg/h via INTRAVENOUS
  Administered 2019-08-10: 5 mg/h via INTRAVENOUS
  Administered 2019-08-10: 02:00:00 3 mg/h via INTRAVENOUS
  Administered 2019-08-10 – 2019-08-12 (×4): 5 mg/h via INTRAVENOUS
  Administered 2019-08-12 (×2): 6 mg/h via INTRAVENOUS
  Administered 2019-08-13: 7 mg/h via INTRAVENOUS
  Administered 2019-08-13: 6 mg/h via INTRAVENOUS
  Administered 2019-08-13: 7 mg/h via INTRAVENOUS
  Filled 2019-07-29 (×23): qty 50

## 2019-07-29 NOTE — Progress Notes (Signed)
NAME:  [name], MRN:  [id number], DOB:  May 27, 1951, LOS: 4 ADMISSION DATE:  07/25/2019, CONSULTATION DATE:  10/3 REFERRING MD:  Bridgett Larsson, CHIEF COMPLAINT:  Dyspnea   Brief History   68 y/o male admitted on 10/3 from the [facility] ED with ARDS from COVID pneumonia leading to need for mechanical ventilation.    Past Medical History  COVID 19 Heartburn  Significant Hospital Events   10/3 admission  Consults:  PCCM  Procedures:  10/2 ETT>  10/2 R IJ CVL >   Significant Diagnostic Tests:  10/2 CT angiogram > personally reviewed, extensive bilateral airspace disease predominantly posterior  Micro Data:  9/20 SARS COV 2 > POSITIVE 10/2 blood >   Antimicrobials/COVID Rx  10/3 remdesivir > 10/6 10/3 actemra 10/3 decadron > 10/4 convalescent plasma, repeat 10/6  Interim history/subjective:   Oxygenation slightly worse Remains in supine position No secretions No fever  Objective   Blood pressure 131/79, pulse (!) 59, temperature (!) 96.6 F (35.9 C), resp. rate 16, height 5\' 10"  (1.778 m), weight 95 kg, SpO2 91 %. CVP:  [7 mmHg] 7 mmHg  Vent Mode: PRVC FiO2 (%):  [45 %] 45 % Set Rate:  [20 bmp] 20 bmp Vt Set:  [510 mL] 510 mL PEEP:  [12 cmH20-16 cmH20] 16 cmH20 Plateau Pressure:  [22 cmH20-28 cmH20] 26 cmH20   Intake/Output Summary (Last 24 hours) at 07/29/2019 0809 Last data filed at 07/29/2019 0700 Gross per 24 hour  Intake 2204.85 ml  Output 1520 ml  Net 684.85 ml   Filed Weights   07/28/19 0400 07/29/19 0323 07/29/19 0500  Weight: 94 kg 95.9 kg 95 kg    Examination:  General:  In bed on vent HENT: NCAT ETT in place PULM: CTA B, vent supported breathing CV: RRR, no mgr GI: BS infrequent, soft, nontender MSK: normal bulk and tone Neuro: sedated on vent    Resolved Hospital Problem list     Assessment & Plan:  Severe ARDS due to COVID 19 pneumonia Plan decadron 10 days Completes remdesivir today Continue mechanical ventilation per ARDS protocol  Target TVol 6-8cc/kgIBW Target Plateau Pressure < 30cm H20 Target driving pressure less than 15 cm of water Target PaO2 55-65: titrate PEEP/FiO2 per protocol As long as PaO2 to FiO2 ratio is less than 1:150 position in prone position for 16 hours a day Check CVP daily if CVL in place Target CVP less than 4, diurese as necessary Ventilator associated pneumonia prevention protocol 10/6: diurese with lasix, prone positioning again  Anticoagulation/DVT prevention in setting of thrombophilia from COVID 19 Cannot enroll in [place] as wife has COVID and we don't have a way for her to undergo consent virtually, she doesn't feel good enough to drive in Continue lovenox 0.5mg /kg bid per protocol  Need for sedation/mechanical ventilation D/c intermittent paralytic protocol RASS target -3 Fentanyl/versed infusions   Best practice:  Diet: tube feeding, start today Pain/Anxiety/Delirium protocol (if indicated): as above VAP protocol (if indicated): yes DVT prophylaxis: as above GI prophylaxis: famotidine Glucose control: SSI Mobility: bed rest Code Status: full Family Communication: I called his wife [name] on 10/6 and gave her an update Disposition: remain in ICU  Labs   CBC: Recent Labs  Lab 07/25/19 1656  07/26/19 0425  07/27/19 0231  07/28/19 0500 07/28/19 1146 07/28/19 1456 07/29/19 0450 07/29/19 0547  WBC 10.2  --  14.0*  --  16.4*  --  14.8*  --   --  10.7*  --   NEUTROABS  9.0*  --  12.9*  --  15.2*  --  13.6*  --   --  9.7*  --   HGB 13.2   < > 11.7*   < > 12.4*   < > 11.5* 11.2* 11.2* 11.3* 15.3  HCT 40.1   < > 36.0*   < > 38.6*   < > 36.9* 33.0* 33.0* 37.1* 45.0  MCV 92.4  --  93.3  --  94.8  --  97.1  --   --  100.3*  --   PLT 377  --  300  --  437*  --  450*  --   --  415*  --    < > = values in this interval not displayed.    Basic Metabolic Panel: Recent Labs  Lab 07/25/19 1656  07/26/19 0425  07/27/19 0231  07/28/19 0500 07/28/19 1146 07/28/19 1456  07/29/19 0450 07/29/19 0547  NA 134*   < > 138   < > 144   < > 151* 150* 149* 152* 150*  K 3.9   < > 4.3   < > 4.5   < > 4.6 4.6 4.6 4.9 4.4  CL 97*  --  102  --  111  --  113*  --   --  115*  --   CO2 24  --  27  --  24  --  29  --   --  28  --   GLUCOSE 131*  --  188*  --  214*  --  157*  --   --  151*  --   BUN 26*  --  31*  --  49*  --  56*  --   --  50*  --   CREATININE 1.51*  --  1.35*  --  1.95*  --  1.75*  --   --  1.27*  --   CALCIUM 8.4*  --  8.0*  --  8.4*  --  8.5*  --   --  8.6*  --   MG  --   --  2.7*  --  3.2*  --  3.1*  --   --  2.9*  --   PHOS  --   --  4.1  --  3.4  --  3.2  --   --  2.8  --    < > = values in this interval not displayed.   GFR: Estimated Creatinine Clearance: 66.2 mL/min (A) (by C-G formula based on SCr of 1.27 mg/dL (H)). Recent Labs  Lab 07/25/19 1656 07/25/19 2005 07/26/19 0425 07/27/19 0231 07/28/19 0500 07/29/19 0450  PROCALCITON 3.62  --   --   --   --   --   WBC 10.2  --  14.0* 16.4* 14.8* 10.7*  LATICACIDVEN 2.2* 1.2  --   --   --   --     Liver Function Tests: Recent Labs  Lab 07/25/19 1656 07/26/19 0425 07/27/19 0231 07/28/19 0500 07/29/19 0450  AST 52* 47* 30 25 32  ALT 31 32 29 27 28   ALKPHOS 54 49 66 56 58  BILITOT 0.9 0.8 0.6 0.6 0.4  PROT 7.2 6.3* 6.8 6.2* 5.8*  ALBUMIN 3.1* 2.5* 2.6* 2.5* 2.5*   No results for input(s): LIPASE, AMYLASE in the last 168 hours. No results for input(s): AMMONIA in the last 168 hours.  ABG    Component Value Date/Time   PHART 7.431 07/29/2019 0547   PCO2ART 44.0 07/29/2019  0547   PO2ART 60.0 (L) 07/29/2019 0547   HCO3 29.6 (H) 07/29/2019 0547   TCO2 31 07/29/2019 0547   ACIDBASEDEF 1.0 07/27/2019 0546   O2SAT 92.0 07/29/2019 0547     Coagulation Profile: No results for input(s): INR, PROTIME in the last 168 hours.  Cardiac Enzymes: No results for input(s): CKTOTAL, CKMB, CKMBINDEX, TROPONINI in the last 168 hours.  HbA1C: Hgb A1c MFr Bld  Date/Time Value Ref Range  Status  07/26/2019 05:00 AM 6.1 (H) 4.8 - 5.6 % Final    Comment:    (NOTE) Pre diabetes:          5.7%-6.4% Diabetes:              >6.4% Glycemic control for   <7.0% adults with diabetes     CBG: Recent Labs  Lab 07/28/19 1530 07/28/19 2019 07/28/19 2337 07/29/19 0325 07/29/19 0739  GLUCAP 185* 163* 168* 149* 118*     Critical care time: 35 minutes     Roselie Awkward, MD [place] [facility] Pager: [phone] Cell: [phone] If no response, call [phone]

## 2019-07-29 NOTE — Progress Notes (Signed)
Turned [name]' head to his left and rotated arms.  Patient tried to sit up on hands and was given a bolus to relax him.  Drainage noted from his nose.

## 2019-07-29 NOTE — Progress Notes (Signed)
Patient placed on prone position by RT x 2 and RN x 4 without complications.  ETT secured with cloth tape at 27 in the center, padding placed on the back of the neck, cheeks, above the upper lip, forehead, and chin.  Patient placed on the new formable silicon head positioner.

## 2019-07-29 NOTE — Progress Notes (Addendum)
PROGRESS NOTE    [name]  ZOX:[id number] DOB: [birth year] DOA: 07/25/2019 PCP: Kathyrn Drown, MD   Brief Narrative:  68 year old BM PMHx GERD, BPH   Presented to [facility] ER with shortness of breath and hypoxia.  Patient was diagnosed with COVID 19 on July 13 9019.  Patient's wife also has COVID-19 but is asymptomatic.  Over the last 1 to 2 days, the patient's shortness of breath is increased.  Patient has been suffering with anorexia.  Patient's wife was actually having a conversation via teleconference with the patient's PCP.  PCP is also otherwise physician.  He had actually called to check on how the wife was doing.  The wife had mentioned to the physician that the patient has been laying in bed.  When the physician listened to how the patient was speaking over the phone, it was noted the patient was tachypneic.  EMS was activated.  When EMS arrived, the patient's O2 saturations were apparently in the 40 percentile range.  Patient was placed on nonrebreather.  He was transferred to the ER.  In the ER, his oxygen saturations on 100% nonrebreather were only in the 80s.  Patient was confused.  Patient was emergently intubated.  Patient given IV steroids.  Initial chest x-ray demonstrates diffuse multifocal infiltrates.  CT scan of the chest was negative for PE.  ABG prior to leaving for [place] demonstrates a pH 7.41 PCO2 44 PO2 of 87 on FiO2 100%.  AA gradient calculates out to 571.  I personally called the patient's wife on the evening of July 25, 2019.  During that conversation we had discussed using IV steroids, IV remdesivir.  We also discussed off label use of Actemra and possibly confluence of plasma due to the patient's severe COVID-19 pneumonia.  She verbally agreed that the medical team should try anything possible to make the patient better.  Confirm with the wife the patient is a full code.    ED Course: emergently intubated. Central line placed. Started on  IV Decadron.    Subjective: 10/6   10/5 somnolent, prone, sedated, last 24 hours afebrile   Assessment & Plan:   Principal Problem:   Pneumonia due to COVID-19 virus Active Problems:   BPH (benign prostatic hyperplasia)   Acute respiratory failure due to COVID-19 (HCC)   AKI (acute kidney injury) (HCC)   Fluid overload  COVID pneumonia/acute respiratory stress with hypoxia - Continue Decadron - Continue Remdesivir per pharmacy protocol - 10/2 Actemra x1 dose - 10/3 counseled patient wife on CCP over phone and she has agreed to allow [place] to administer CCP.  Paperwork in chart  -10/3 transfuse 1 unit CCP - Continue prone patient> 16 hours/day if patient tolerates.  If not prone patient for 2 to 3 hours every shift. -10/3 patient enrolled in [facility]  Lab 07/25/19 1656 07/26/19 0425 07/27/19 0231 07/28/19 0500 07/29/19 0450  CRP 19.2* 18.0* 16.6* 8.3* 5.1*   Recent Labs  Lab 07/25/19 1656 07/26/19 0425 07/27/19 0231 07/28/19 0500 07/29/19 0450  DDIMER >20.00* >20.00* >20.00* >20.00* >20.00*  - Titrate patient's vent settings to maintain SPO2 88 to 93%, if possible.  Will use ARDS lung protective protocol (6 to 8 cc/kg/min). - Attempt to maintain plateau pressure<30 - Attempt to maintain driving pressure 15 - 10/5 PF ratio= 170 while prone - 10/6 PCXR pending - 10/6 transfuse 1 unit CCP (this is patient's second unit)  Fluid overload - Strict in and out +  2.5 L -Daily weight Filed Weights   07/28/19 0400 07/29/19 0323 07/29/19 0500  Weight: 94 kg 95.9 kg 95 kg  - CVP daily -10/4 Lasix IV 40 mg x 2 doses - 10/5 CVP= 7  Acute kidney injury - Avoid nephrotoxic medication Recent Labs  Lab 07/25/19 1656 07/26/19 0425 07/27/19 0231 07/28/19 0500 07/29/19 0450  CREATININE 1.51* 1.35* 1.95* 1.75* 1.27*  -Monitor closely trended up today; received Lasix 10/4 secondary to fluid overload. -10/5 although CVP remains elevated would not  repeat Lasix dose at this time. -10/6 continue Lasix  BPH -Not on home meds.  But be cognizant of urinary retention    Goals of care - CCP use; counseled Mrs. Saur that CCP is not improving therapy for COVID-19 though available data shows it may have benefit with high titer plasma is given.  Is unclear if it is effective other patients and with use of low titer plasma.  Risk and benefits of its use were explained to Mrs. Counihan and she agreed to treatment..  All appropriate IRB consent paperwork has been signed and is in patient's chart.   DVT prophylaxis: Lovenox (ICU dose) Code Status: Full Family Communication: 10/6 Dr. Kathalene Frames spoke with family Disposition Plan: TBD   Consultants:  PCCM   Procedures/Significant Events:  10/2 PCXR;-Endotracheal tube remains with tip above the thoracic inlet. Recommend repositioning and advancement. Esophagogastric tube with tip and side port below the diaphragm. - Unchanged diffuse, extensive bilateral heterogeneous opacity, consistent with multifocal infection, edema, and/or ARDS. 10/3 PCXR; slightly improved extensive bilateral heterogeneous opacifications (my read) 10/3 transfuse 1 unit CCP 10/6 transfuse 1 unit CCP (this is patient's second unit)     I have personally reviewed and interpreted all radiology studies and my findings are as above.  VENTILATOR SETTINGS: Vent mode; PRVC Vt Set; 510 Set rate; 20 FiO2; 45% I time; 0.9 PEEP; 16   Cultures 9/29 Novel coronavirus positive 10/2 blood LEFT antecubital NGTD 10/2 blood hand NGTD 10/3 MRSA by PCR negative     Antimicrobials: Anti-infectives (From admission, onward)   Start     Stop   07/26/19 1600  remdesivir 100 mg in sodium chloride 0.9 % 250 mL IVPB     07/30/19 1559   07/26/19 0215  remdesivir 200 mg in sodium chloride 0.9 % 250 mL IVPB     07/26/19 0520       Devices    LINES / TUBES:  #7.5 ETT 10/2>> RIGHT IJ CVL>>>   Continuous Infusions:  . sodium chloride    . feeding supplement (VITAL 1.5 CAL) 1,000 mL (07/27/19 1204)  . fentaNYL infusion INTRAVENOUS 100 mcg/hr (07/29/19 0700)  . midazolam 2 mg/hr (07/29/19 0700)  . norepinephrine (LEVOPHED) Adult infusion Stopped (07/28/19 2137)  . remdesivir 100 mg in NS 250 mL Stopped (07/28/19 1818)     Objective: Vitals:   07/29/19 0510 07/29/19 0600 07/29/19 0700 07/29/19 0741  BP: (!) 142/64 121/78  131/79  Pulse: 60 (!) 58 (!) 59   Resp: (!) '21 20 16   '$ Temp:  (!) 96.8 F (36 C) (!) 96.6 F (35.9 C)   TempSrc:    Esophageal  SpO2: 92% 92% 91%   Weight:      Height:        Intake/Output Summary (Last 24 hours) at 07/29/2019 0758 Last data filed at 07/29/2019 0700 Gross per 24 hour  Intake 2244.85 ml  Output 1620 ml  Net 624.85 ml   Filed Weights   07/28/19  0400 07/29/19 0323 07/29/19 0500  Weight: 94 kg 95.9 kg 95 kg   Physical Exam:  General: Sedated/intubated, supine, positive acute respiratory distress Eyes: negative scleral hemorrhage, negative anisocoria, negative icterus ENT: Negative Runny nose, negative gingival bleeding, #7.5 ETT tube in place Neck:  Negative scars, masses, torticollis, lymphadenopathy, JVD, RIGHT IJ CVL in place negative sign of infection Lungs: Clear to auscultation bilaterally without wheezes or crackles Cardiovascular: Bradycardic, without murmur gallop or rub normal S1 and S2 Abdomen: negative abdominal pain, nondistended, hypoactive bowel sounds, no rebound, no ascites, no appreciable mass Extremities: No significant cyanosis, clubbing, or edema bilateral lower extremities Skin: Negative rashes, lesions, ulcers Psychiatric: Sedated/intubated   Central nervous system: Sedated/intubated          Data Reviewed: Care during the described time interval was provided by me .  I have reviewed this patient's available data, including medical history, events of note, physical examination, and all test results as part of my  evaluation.   CBC: Recent Labs  Lab 07/25/19 1656  07/26/19 0425  07/27/19 0231  07/28/19 0500 07/28/19 1146 07/28/19 1456 07/29/19 0450 07/29/19 0547  WBC 10.2  --  14.0*  --  16.4*  --  14.8*  --   --  10.7*  --   NEUTROABS 9.0*  --  12.9*  --  15.2*  --  13.6*  --   --  9.7*  --   HGB 13.2   < > 11.7*   < > 12.4*   < > 11.5* 11.2* 11.2* 11.3* 15.3  HCT 40.1   < > 36.0*   < > 38.6*   < > 36.9* 33.0* 33.0* 37.1* 45.0  MCV 92.4  --  93.3  --  94.8  --  97.1  --   --  100.3*  --   PLT 377  --  300  --  437*  --  450*  --   --  415*  --    < > = values in this interval not displayed.   Basic Metabolic Panel: Recent Labs  Lab 07/25/19 1656  07/26/19 0425  07/27/19 0231  07/28/19 0500 07/28/19 1146 07/28/19 1456 07/29/19 0450 07/29/19 0547  NA 134*   < > 138   < > 144   < > 151* 150* 149* 152* 150*  K 3.9   < > 4.3   < > 4.5   < > 4.6 4.6 4.6 4.9 4.4  CL 97*  --  102  --  111  --  113*  --   --  115*  --   CO2 24  --  27  --  24  --  29  --   --  28  --   GLUCOSE 131*  --  188*  --  214*  --  157*  --   --  151*  --   BUN 26*  --  31*  --  49*  --  56*  --   --  50*  --   CREATININE 1.51*  --  1.35*  --  1.95*  --  1.75*  --   --  1.27*  --   CALCIUM 8.4*  --  8.0*  --  8.4*  --  8.5*  --   --  8.6*  --   MG  --   --  2.7*  --  3.2*  --  3.1*  --   --  2.9*  --   PHOS  --   --  4.1  --  3.4  --  3.2  --   --  2.8  --    < > = values in this interval not displayed.   GFR: Estimated Creatinine Clearance: 66.2 mL/min (A) (by C-G formula based on SCr of 1.27 mg/dL (H)). Liver Function Tests: Recent Labs  Lab 07/25/19 1656 07/26/19 0425 07/27/19 0231 07/28/19 0500 07/29/19 0450  AST 52* 47* 30 25 32  ALT 31 32 '29 27 28  '$ ALKPHOS 54 49 66 56 58  BILITOT 0.9 0.8 0.6 0.6 0.4  PROT 7.2 6.3* 6.8 6.2* 5.8*  ALBUMIN 3.1* 2.5* 2.6* 2.5* 2.5*   No results for input(s): LIPASE, AMYLASE in the last 168 hours. No results for input(s): AMMONIA in the last 168 hours.  Coagulation Profile: No results for input(s): INR, PROTIME in the last 168 hours. Cardiac Enzymes: No results for input(s): CKTOTAL, CKMB, CKMBINDEX, TROPONINI in the last 168 hours. BNP (last 3 results) No results for input(s): PROBNP in the last 8760 hours. HbA1C: No results for input(s): HGBA1C in the last 72 hours. CBG: Recent Labs  Lab 07/28/19 1530 07/28/19 2019 07/28/19 2337 07/29/19 0325 07/29/19 0739  GLUCAP 185* 163* 168* 149* 118*   Lipid Profile: No results for input(s): CHOL, HDL, LDLCALC, TRIG, CHOLHDL, LDLDIRECT in the last 72 hours. Thyroid Function Tests: No results for input(s): TSH, T4TOTAL, FREET4, T3FREE, THYROIDAB in the last 72 hours. Anemia Panel: No results for input(s): VITAMINB12, FOLATE, FERRITIN, TIBC, IRON, RETICCTPCT in the last 72 hours. Urine analysis:    Component Value Date/Time   COLORURINE YELLOW 07/25/2019 1713   APPEARANCEUR HAZY (A) 07/25/2019 1713   LABSPEC 1.027 07/25/2019 1713   PHURINE 5.0 07/25/2019 1713   GLUCOSEU NEGATIVE 07/25/2019 1713   HGBUR MODERATE (A) 07/25/2019 1713   BILIRUBINUR NEGATIVE 07/25/2019 1713   KETONESUR NEGATIVE 07/25/2019 1713   PROTEINUR 100 (A) 07/25/2019 1713   NITRITE NEGATIVE 07/25/2019 1713   LEUKOCYTESUR NEGATIVE 07/25/2019 1713   Sepsis Labs: '@LABRCNTIP'$ (procalcitonin:4,lacticidven:4)  ) Recent Results (from the past 240 hour(s))  Novel Coronavirus, NAA ([facility])     Status: Abnormal   Collection Time: 07/22/19 12:00 AM   Specimen: Oropharyngeal(OP) collection in vial transport medium   OROPHARYNGEA  TESTING  Result Value Ref Range Status   SARS-CoV-2, NAA Detected (A) Not Detected Final    Comment: This nucleic acid amplification test was developed and its performance characteristics determined by [employer]. Nucleic acid amplification tests include PCR and TMA. This test has not been FDA cleared or approved. This test has been authorized by FDA under an Emergency Use  Authorization (EUA). This test is only authorized for the duration of time the declaration that circumstances exist justifying the authorization of the emergency use of in vitro diagnostic tests for detection of SARS-CoV-2 virus and/or diagnosis of COVID-19 infection under section 564(b)(1) of the Act, 21 U.S.C. 893YBO-1(B) (1), unless the authorization is terminated or revoked sooner. When diagnostic testing is negative, the possibility of a false negative result should be considered in the context of a patient's recent exposures and the presence of clinical signs and symptoms consistent with COVID-19. An individual without symptoms of COVID-19 and who is not shedding SARS-CoV-2 virus would  expect to have a negative (not detected) result in this assay.   Blood Culture (routine x 2)     Status: None (Preliminary result)   Collection Time: 07/25/19  4:56 PM   Specimen: Left Antecubital; Blood  Result Value Ref Range Status  Specimen Description LEFT ANTECUBITAL  Final   Special Requests   Final    BOTTLES DRAWN AEROBIC AND ANAEROBIC Blood Culture results may not be optimal due to an excessive volume of blood received in culture bottles   Culture   Final    NO GROWTH 3 DAYS Performed at [facility], [address], [place], [place] [address]    Report Status PENDING  Incomplete  Blood Culture (routine x 2)     Status: None (Preliminary result)   Collection Time: 07/25/19  5:01 PM   Specimen: Blood  Result Value Ref Range Status   Specimen Description BLOOD RIGHT HAND  Final   Special Requests   Final    BOTTLES DRAWN AEROBIC AND ANAEROBIC Blood Culture adequate volume   Culture   Final    NO GROWTH 3 DAYS Performed at [facility], [address], [place], [place] [address]    Report Status PENDING  Incomplete  MRSA PCR Screening     Status: None   Collection Time: 07/26/19  2:09 AM   Specimen: Nasal Mucosa; Nasopharyngeal  Result Value Ref Range Status   MRSA by PCR  NEGATIVE NEGATIVE Final    Comment:        The GeneXpert MRSA Assay (FDA approved for NASAL specimens only), is one component of a comprehensive MRSA colonization surveillance program. It is not intended to diagnose MRSA infection nor to guide or monitor treatment for MRSA infections. Performed at [facility], [place] [address] [place], [place] [address]          Radiology Studies: No results found.      Scheduled Meds: . sodium chloride   Intravenous Once  . artificial tears  1 application Both Eyes B1Y  . chlorhexidine gluconate (MEDLINE KIT)  15 mL Mouth Rinse BID  . Chlorhexidine Gluconate Cloth  6 each Topical Daily  . dexamethasone (DECADRON) injection  6 mg Intravenous Q24H  . enoxaparin (LOVENOX) injection  40 mg Subcutaneous Q12H  . famotidine  20 mg Per Tube BID  . feeding supplement (PRO-STAT SUGAR FREE 64)  30 mL Per Tube TID  . fentaNYL (SUBLIMAZE) injection  25 mcg Intravenous Once  . insulin aspart  0-9 Units Subcutaneous Q4H  . mouth rinse  15 mL Mouth Rinse 10 times per day   Continuous Infusions: . sodium chloride    . feeding supplement (VITAL 1.5 CAL) 1,000 mL (07/27/19 1204)  . fentaNYL infusion INTRAVENOUS 100 mcg/hr (07/29/19 0700)  . midazolam 2 mg/hr (07/29/19 0700)  . norepinephrine (LEVOPHED) Adult infusion Stopped (07/28/19 2137)  . remdesivir 100 mg in NS 250 mL Stopped (07/28/19 1818)     LOS: 4 days   The patient is critically ill with multiple organ systems failure and requires high complexity decision making for assessment and support, frequent evaluation and titration of therapies, application of advanced monitoring technologies and extensive interpretation of multiple databases. Critical Care Time devoted to patient care services described in this note  Time spent: 40 minutes     Hendrik Donath, Geraldo Docker, MD [facility] Pager [phone]  If 7PM-7AM, please contact night-coverage [email or web address] Password  [email or web address] 07/29/2019, 7:58 AM

## 2019-07-29 NOTE — Progress Notes (Signed)
Patient remains proned. Head repositioned to left.  Arms rotated. RT x2 . Rn x2 at bedside. No issues.

## 2019-07-29 NOTE — Progress Notes (Addendum)
Patients head repositioned to his right. Arms rotated. Z- FLo Fluidized positioner in place. No complications.

## 2019-07-30 DIAGNOSIS — J1289 Other viral pneumonia: Secondary | ICD-10-CM | POA: Diagnosis not present

## 2019-07-30 DIAGNOSIS — J9600 Acute respiratory failure, unspecified whether with hypoxia or hypercapnia: Secondary | ICD-10-CM | POA: Diagnosis not present

## 2019-07-30 DIAGNOSIS — U071 COVID-19: Secondary | ICD-10-CM | POA: Diagnosis not present

## 2019-07-30 DIAGNOSIS — N179 Acute kidney failure, unspecified: Secondary | ICD-10-CM | POA: Diagnosis not present

## 2019-07-30 LAB — GLUCOSE, CAPILLARY
Glucose-Capillary: 168 mg/dL — ABNORMAL HIGH (ref 70–99)
Glucose-Capillary: 146 mg/dL — ABNORMAL HIGH (ref 70–99)
Glucose-Capillary: 124 mg/dL — ABNORMAL HIGH (ref 70–99)
Glucose-Capillary: 160 mg/dL — ABNORMAL HIGH (ref 70–99)
Glucose-Capillary: 167 mg/dL — ABNORMAL HIGH (ref 70–99)
Glucose-Capillary: 163 mg/dL — ABNORMAL HIGH (ref 70–99)
Glucose-Capillary: 163 mg/dL — ABNORMAL HIGH (ref 70–99)

## 2019-07-30 LAB — POCT I-STAT 7, (LYTES, BLD GAS, ICA,H+H)
Acid-Base Excess: 8 mmol/L — ABNORMAL HIGH (ref 0.0–2.0)
Bicarbonate: 34 mmol/L — ABNORMAL HIGH (ref 20.0–28.0)
Calcium, Ion: 1.29 mmol/L (ref 1.15–1.40)
HCT: 39 % (ref 39.0–52.0)
Hemoglobin: 13.3 g/dL (ref 13.0–17.0)
O2 Saturation: 93 %
Patient temperature: 37
Potassium: 4.9 mmol/L (ref 3.5–5.1)
Sodium: 149 mmol/L — ABNORMAL HIGH (ref 135–145)
TCO2: 36 mmol/L — ABNORMAL HIGH (ref 22–32)
pCO2 arterial: 52.4 mmHg — ABNORMAL HIGH (ref 32.0–48.0)
pH, Arterial: 7.42 (ref 7.350–7.450)
pO2, Arterial: 66 mmHg — ABNORMAL LOW (ref 83.0–108.0)

## 2019-07-30 LAB — MAGNESIUM: Magnesium: 2.7 mg/dL — ABNORMAL HIGH (ref 1.7–2.4)

## 2019-07-30 LAB — CBC WITH DIFFERENTIAL/PLATELET
Abs Immature Granulocytes: 0.44 10*3/uL — ABNORMAL HIGH (ref 0.00–0.07)
Basophils Absolute: 0 10*3/uL (ref 0.0–0.1)
Basophils Relative: 0 %
Eosinophils Absolute: 0.1 10*3/uL (ref 0.0–0.5)
Eosinophils Relative: 1 %
HCT: 41.9 % (ref 39.0–52.0)
Hemoglobin: 12.9 g/dL — ABNORMAL LOW (ref 13.0–17.0)
Immature Granulocytes: 4 %
Lymphocytes Relative: 5 %
Lymphs Abs: 0.5 10*3/uL — ABNORMAL LOW (ref 0.7–4.0)
MCH: 30.3 pg (ref 26.0–34.0)
MCHC: 30.8 g/dL (ref 30.0–36.0)
MCV: 98.4 fL (ref 80.0–100.0)
Monocytes Absolute: 0.3 10*3/uL (ref 0.1–1.0)
Monocytes Relative: 3 %
Neutro Abs: 8.6 10*3/uL — ABNORMAL HIGH (ref 1.7–7.7)
Neutrophils Relative %: 87 %
Platelets: 411 10*3/uL — ABNORMAL HIGH (ref 150–400)
RBC: 4.26 MIL/uL (ref 4.22–5.81)
RDW: 15.3 % (ref 11.5–15.5)
WBC: 9.9 10*3/uL (ref 4.0–10.5)
nRBC: 0.6 % — ABNORMAL HIGH (ref 0.0–0.2)

## 2019-07-30 LAB — COMPREHENSIVE METABOLIC PANEL
ALT: 48 U/L — ABNORMAL HIGH (ref 0–44)
AST: 61 U/L — ABNORMAL HIGH (ref 15–41)
Albumin: 2.9 g/dL — ABNORMAL LOW (ref 3.5–5.0)
Alkaline Phosphatase: 67 U/L (ref 38–126)
Anion gap: 13 (ref 5–15)
BUN: 53 mg/dL — ABNORMAL HIGH (ref 8–23)
CO2: 31 mmol/L (ref 22–32)
Calcium: 9 mg/dL (ref 8.9–10.3)
Chloride: 105 mmol/L (ref 98–111)
Creatinine, Ser: 1.43 mg/dL — ABNORMAL HIGH (ref 0.61–1.24)
GFR calc Af Amer: 59 mL/min — ABNORMAL LOW (ref >60)
GFR calc non Af Amer: 51 mL/min — ABNORMAL LOW (ref >60)
Glucose, Bld: 138 mg/dL — ABNORMAL HIGH (ref 70–99)
Potassium: 4.8 mmol/L (ref 3.5–5.1)
Sodium: 149 mmol/L — ABNORMAL HIGH (ref 135–145)
Total Bilirubin: 0.6 mg/dL (ref 0.3–1.2)
Total Protein: 6.6 g/dL (ref 6.5–8.1)

## 2019-07-30 LAB — CULTURE, BLOOD (ROUTINE X 2)
Culture: NO GROWTH
Culture: NO GROWTH
Special Requests: ADEQUATE

## 2019-07-30 LAB — PHOSPHORUS: Phosphorus: 5.5 mg/dL — ABNORMAL HIGH (ref 2.5–4.6)

## 2019-07-30 LAB — C-REACTIVE PROTEIN: CRP: 3 mg/dL — ABNORMAL HIGH (ref <1.0)

## 2019-07-30 LAB — D-DIMER, QUANTITATIVE: D-Dimer, Quant: >20 ug/mL-FEU — ABNORMAL HIGH (ref 0.00–0.50)

## 2019-07-30 MED ORDER — SENNA 8.6 MG PO TABS
1.0000 | ORAL_TABLET | Freq: Two times a day (BID) | ORAL | Status: DC
  Administered 2019-07-30 – 2019-07-31 (×3): 8.6 mg via ORAL
  Filled 2019-07-30 (×3): qty 1

## 2019-07-30 MED ORDER — POLYETHYLENE GLYCOL 3350 17 G PO PACK
17.0000 g | PACK | Freq: Two times a day (BID) | ORAL | Status: DC
  Administered 2019-07-30 – 2019-08-14 (×21): 17 g
  Filled 2019-07-30 (×23): qty 1

## 2019-07-30 NOTE — Progress Notes (Signed)
Notified by blood bank of delay in obtaining convalescent plasma, they will call RN when unit is available

## 2019-07-30 NOTE — Progress Notes (Signed)
Spoke with and updated patient's wife [name]. She was very happy for the update. Answered any questions and concerns she had.

## 2019-07-30 NOTE — Progress Notes (Signed)
PROGRESS NOTE    [name]  IRJ:[id number] DOB: 09-Jan-1951 DOA: 07/25/2019 PCP: Kathyrn Drown, MD   Brief Narrative:  68 year old BM PMHx GERD, BPH   Presented to [facility] ER with shortness of breath and hypoxia.  Patient was diagnosed with COVID 19 on July 13 9019.  Patient's wife also has COVID-19 but is asymptomatic.  Over the last 1 to 2 days, the patient's shortness of breath is increased.  Patient has been suffering with anorexia.  Patient's wife was actually having a conversation via teleconference with the patient's PCP.  PCP is also otherwise physician.  He had actually called to check on how the wife was doing.  The wife had mentioned to the physician that the patient has been laying in bed.  When the physician listened to how the patient was speaking over the phone, it was noted the patient was tachypneic.  EMS was activated.  When EMS arrived, the patient's O2 saturations were apparently in the 40 percentile range.  Patient was placed on nonrebreather.  He was transferred to the ER.  In the ER, his oxygen saturations on 100% nonrebreather were only in the 80s.  Patient was confused.  Patient was emergently intubated.  Patient given IV steroids.  Initial chest x-ray demonstrates diffuse multifocal infiltrates.  CT scan of the chest was negative for PE.  ABG prior to leaving for [place] demonstrates a pH 7.41 PCO2 44 PO2 of 87 on FiO2 100%.  AA gradient calculates out to 571. In ED patient was emergently intubated. Central line placed. Started on IV Decadron.  Subjective: No acute issues or events overnight, patient continues to be ventilated, tolerating sedation quite well, currently awake and able to follow simple commands  Assessment & Plan:   Principal Problem:   Pneumonia due to COVID-19 virus Active Problems:   BPH (benign prostatic hyperplasia)   Acute respiratory failure due to COVID-19 (HCC)   AKI (acute kidney injury) (HCC)   Fluid  overload  COVID pneumonia/acute respiratory stress with hypoxia Vent Mode: PRVC FiO2 (%):  [45 %] 45 % Set Rate:  [20 bmp] 20 bmp Vt Set:  [510 mL] 510 mL PEEP:  [16 cmH20] 16 cmH20 Plateau Pressure:  [16 cmH20-29 cmH20] 27 cmH20 - Continue Decadron - stop date 10/12 - Remdesivir Completed - 10/2 Actemra x1 dose - CRP improving appropriately - 10/3 transfused 1 unit convalescent plasma - Continue prone patient> 16 hours/day, incentive spirometry, shutter valve as patient tolerates - 10/3 patient enrolled in COVID PACT TRIAL Recent Labs    07/28/19 0500 07/29/19 0450 07/30/19 0451  DDIMER >20.00* >20.00* >20.00*  CRP 8.3* 5.1* 3.0*   Lab Results  Component Value Date   SARSCOV2NAA Detected (A) 07/22/2019  - 10/5 PF ratio= 170 while prone - 10/6 PCXR generally improving from previous  Volume overload, improving Intake/Output Summary (Last 24 hours) at 07/30/2019 1454 Last data filed at 07/30/2019 1300 Gross per 24 hour  Intake 1817.11 ml  Output 3125 ml  Net -1307.89 ml    Acute kidney injury, improving Recent Labs  Lab 07/26/19 0425 07/27/19 0231 07/28/19 0500 07/29/19 0450 07/30/19 0451  CREATININE 1.35* 1.95* 1.75* 1.27* 1.43*  -Trending upwards in the setting of previous Lasix -now generally downtrending -Discontinue furosemide  BPH -Patient does not appear to be on any medications at home -Follow closely for urinary retention  DVT prophylaxis: Lovenox (ICU dose) Code Status: Full Family communication: Updated daily Disposition Plan: TBD, pending clinical improvement  Consultants:  PCCM  Procedures/Significant Events:  10/2 PCXR;-Endotracheal tube remains with tip above the thoracic inlet. Recommend repositioning and advancement. Esophagogastric tube with tip and side port below the diaphragm. - Unchanged diffuse, extensive bilateral heterogeneous opacity, consistent with multifocal infection, edema, and/or ARDS. 10/3 PCXR; slightly improved  extensive bilateral heterogeneous opacifications (my read) 10/3 transfuse 1 unit CCP  I have personally reviewed and interpreted all radiology studies and my findings are as above.  Cultures 9/29 Novel coronavirus positive 10/2 blood LEFT antecubital NGTD 10/2 blood hand NGTD 10/3 MRSA by PCR negative  Antimicrobials: Anti-infectives (From admission, onward)   Start     Stop   07/26/19 1600  remdesivir 100 mg in sodium chloride 0.9 % 250 mL IVPB     07/30/19 1559   07/26/19 0215  remdesivir 200 mg in sodium chloride 0.9 % 250 mL IVPB     07/26/19 0520     LINES / TUBES:  #7.5 ETT 10/2>> RIGHT IJ CVL>>>  Continuous Infusions:  sodium chloride     feeding supplement (VITAL 1.5 CAL) 40 mL/hr at 07/30/19 0600   fentaNYL infusion INTRAVENOUS 150 mcg/hr (07/30/19 1300)   midazolam 4 mg/hr (07/30/19 1300)   norepinephrine (LEVOPHED) Adult infusion Stopped (07/28/19 2137)     Objective: Vitals:   07/30/19 1000 07/30/19 1100 07/30/19 1149 07/30/19 1300  BP: 112/77 110/77 110/77 106/80  Pulse: 76 77 77 73  Resp: 18 (!) _0 Temp: 98.6 F (37 C) 98.8 F (37.1 C)  98.6 F (37 C)  TempSrc:      SpO2: 93% 93% 93% 92%  Weight:      Height:        Intake/Output Summary (Last 24 hours) at 07/30/2019 1446 Last data filed at 07/30/2019 1300 Gross per 24 hour  Intake 1817.11 ml  Output 3125 ml  Net -1307.89 ml   Filed Weights   07/29/19 0323 07/29/19 0500 07/30/19 0500  Weight: 95.9 kg 95 kg 93.6 kg   Physical Exam:  General: Sedated/intubated, prone, no acute distress Eyes: negative scleral hemorrhage, negative anisocoria, negative icterus ENT: Negative Runny nose, negative gingival bleeding, #7.5 ETT tube in place Neck:  Negative scars, masses, torticollis, lymphadenopathy, JVD, RIGHT IJ CVL in place negative sign of infection Lungs: Clear to auscultation bilaterally without wheezes or crackles Cardiovascular: Bradycardic, without murmur gallop or rub normal  S1 and S2 Abdomen: negative abdominal pain, nondistended, hypoactive bowel sounds, no rebound, no ascites, no appreciable mass Extremities: No significant cyanosis, clubbing, or edema bilateral lower extremities Skin: Negative rashes, lesions, ulcers  Data Reviewed: Care during the described time interval was provided by me .  I have reviewed this patient's available data, including medical history, events of note, physical examination, and all test results as part of my evaluation.   CBC: Recent Labs  Lab 07/26/19 0425  07/27/19 0231  07/28/19 0500  07/28/19 1456 07/29/19 0450 07/29/19 0547 07/29/19 1445 07/30/19 0451  WBC 14.0*  --  16.4*  --  14.8*  --   --  10.7*  --   --  9.9  NEUTROABS 12.9*  --  15.2*  --  13.6*  --   --  9.7*  --   --  8.6*  HGB 11.7*   < > 12.4*   < > 11.5*   < > 11.2* 11.3* 15.3 12.6* 12.9*  HCT 36.0*   < > 38.6*   < > 36.9*   < > 33.0* 37.1* 45.0 37.0* 41.9  MCV 93.3  --  94.8  --  97.1  --   --  100.3*  --   --  98.4  PLT 300  --  437*  --  450*  --   --  415*  --   --  411*   < > = values in this interval not displayed.   Basic Metabolic Panel: Recent Labs  Lab 07/26/19 0425  07/27/19 0231  07/28/19 0500  07/28/19 1456 07/29/19 0450 07/29/19 0547 07/29/19 1445 07/30/19 0451  NA 138   < > 144   < > 151*   < > 149* 152* 150* 150* 149*  K 4.3   < > 4.5   < > 4.6   < > 4.6 4.9 4.4 4.4 4.8  CL 102  --  111  --  113*  --   --  115*  --   --  105  CO2 27  --  24  --  29  --   --  28  --   --  31  GLUCOSE 188*  --  214*  --  157*  --   --  151*  --   --  138*  BUN 31*  --  49*  --  56*  --   --  50*  --   --  53*  CREATININE 1.35*  --  1.95*  --  1.75*  --   --  1.27*  --   --  1.43*  CALCIUM 8.0*  --  8.4*  --  8.5*  --   --  8.6*  --   --  9.0  MG 2.7*  --  3.2*  --  3.1*  --   --  2.9*  --   --  2.7*  PHOS 4.1  --  3.4  --  3.2  --   --  2.8  --   --  5.5*   < > = values in this interval not displayed.   GFR: Estimated Creatinine Clearance:  58.4 mL/min (A) (by C-G formula based on SCr of 1.43 mg/dL (H)).  Liver Function Tests: Recent Labs  Lab 07/26/19 0425 07/27/19 0231 07/28/19 0500 07/29/19 0450 07/30/19 0451  AST 47* 30 25 32 61*  ALT 32 _0 48*  ALKPHOS 49 66 56 58 67  BILITOT 0.8 0.6 0.6 0.4 0.6  PROT 6.3* 6.8 6.2* 5.8* 6.6  ALBUMIN 2.5* 2.6* 2.5* 2.5* 2.9*   CBG: Recent Labs  Lab 07/29/19 1952 07/29/19 2305 07/30/19 0451 07/30/19 0822 07/30/19 1214  GLUCAP 168* 127* 146* 124* 160*   Urine analysis:    Component Value Date/Time   COLORURINE YELLOW 07/25/2019 1713   APPEARANCEUR HAZY (A) 07/25/2019 1713   LABSPEC 1.027 07/25/2019 1713   PHURINE 5.0 07/25/2019 1713   GLUCOSEU NEGATIVE 07/25/2019 1713   HGBUR MODERATE (A) 07/25/2019 1713   BILIRUBINUR NEGATIVE 07/25/2019 1713   KETONESUR NEGATIVE 07/25/2019 1713   PROTEINUR 100 (A) 07/25/2019 1713   NITRITE NEGATIVE 07/25/2019 1713   LEUKOCYTESUR NEGATIVE 07/25/2019 1713    Recent Results (from the past 240 hour(s))  Novel Coronavirus, NAA ([facility])     Status: Abnormal   Collection Time: 07/22/19 12:00 AM   Specimen: Oropharyngeal(OP) collection in vial transport medium   OROPHARYNGEA  TESTING  Result Value Ref Range Status   SARS-CoV-2, NAA Detected (A) Not Detected Final    Comment: This nucleic acid amplification test was developed and its performance characteristics determined by [employer]. Nucleic acid amplification  tests include PCR and TMA. This test has not been FDA cleared or approved. This test has been authorized by FDA under an Emergency Use Authorization (EUA). This test is only authorized for the duration of time the declaration that circumstances exist justifying the authorization of the emergency use of in vitro diagnostic tests for detection of SARS-CoV-2 virus and/or diagnosis of COVID-19 infection under section 564(b)(1) of the Act, 21 U.S.C. 707EML-5(Q) (1), unless the authorization is terminated or  revoked sooner. When diagnostic testing is negative, the possibility of a false negative result should be considered in the context of a patient's recent exposures and the presence of clinical signs and symptoms consistent with COVID-19. An individual without symptoms of COVID-19 and who is not shedding SARS-CoV-2 virus would  expect to have a negative (not detected) result in this assay.   Blood Culture (routine x 2)     Status: None   Collection Time: 07/25/19  4:56 PM   Specimen: Left Antecubital; Blood  Result Value Ref Range Status   Specimen Description LEFT ANTECUBITAL  Final   Special Requests   Final    BOTTLES DRAWN AEROBIC AND ANAEROBIC Blood Culture results may not be optimal due to an excessive volume of blood received in culture bottles   Culture   Final    NO GROWTH 5 DAYS Performed at [facility], [address], [place], [place] [address]    Report Status 07/30/2019 FINAL  Final  Blood Culture (routine x 2)     Status: None   Collection Time: 07/25/19  5:01 PM   Specimen: BLOOD RIGHT HAND  Result Value Ref Range Status   Specimen Description BLOOD RIGHT HAND  Final   Special Requests   Final    BOTTLES DRAWN AEROBIC AND ANAEROBIC Blood Culture adequate volume   Culture   Final    NO GROWTH 5 DAYS Performed at [facility], [address] [place], [place] [address]    Report Status 07/30/2019 FINAL  Final  MRSA PCR Screening     Status: None   Collection Time: 07/26/19  2:09 AM   Specimen: Nasal Mucosa; Nasopharyngeal  Result Value Ref Range Status   MRSA by PCR NEGATIVE NEGATIVE Final    Comment:        The GeneXpert MRSA Assay (FDA approved for NASAL specimens only), is one component of a comprehensive MRSA colonization surveillance program. It is not intended to diagnose MRSA infection nor to guide or monitor treatment for MRSA infections. Performed at [facility], [place] [address], [place], [place] [address]      Radiology  Studies: Dg Chest Port 1 View  Result Date: 07/29/2019 CLINICAL DATA:  Acute respiratory failure. COVID-19 infection. EXAM: PORTABLE CHEST 1 VIEW COMPARISON:  Radiographs 07/29/2019 and 07/26/2019. CT 07/25/2019. FINDINGS: 1409 hours. The position of the endotracheal tube, enteric tube and right IJ central venous catheter has not significantly changed. The heart size and mediastinal contours are stable. There has been further partial clearing of the patchy bilateral airspace opacities. No pneumothorax or enlarging pleural effusion identified. The bones appear normal. IMPRESSION: Further partial clearing of patchy bilateral airspace opacities consistent with improving pneumonia. Stable position of the support system. Electronically Signed   By: Richardean Sale M.D.   On: 07/29/2019 15:57   Dg Chest Port 1 View  Result Date: 07/29/2019 CLINICAL DATA:  COVID-19 pneumonia EXAM: PORTABLE CHEST 1 VIEW COMPARISON:  07/26/2019 FINDINGS: Interval improvement in extensive bilateral heterogeneous airspace disease, which is  much less dense and consolidative appearing, particularly in the bilateral upper lobes. Endotracheal tube and right neck vascular catheter remain in unchanged position. Esophagogastric tube is positioned with tip below the diaphragm although directed to the right and side port near the gastroesophageal junction, possibly within a sliding hiatal hernia. Cardiomegaly. IMPRESSION: 1. Interval improvement in extensive bilateral heterogeneous airspace disease, which is much less dense and consolidative appearing, particularly in the bilateral upper lobes. 2. Endotracheal tube and right neck vascular catheter remain in unchanged position 3. Esophagogastric tube is positioned with tip below the diaphragm although directed to the right and side port near the gastroesophageal junction, possibly within a sliding hiatal hernia. Recommend repositioning and advancement to ensure fully subdiaphragmatic positioning.  4.  Cardiomegaly. Electronically Signed   By: Eddie Candle M.D.   On: 07/29/2019 09:09        Scheduled Meds:  sodium chloride   Intravenous Once   chlorhexidine gluconate (MEDLINE KIT)  15 mL Mouth Rinse BID   Chlorhexidine Gluconate Cloth  6 each Topical Daily   dexamethasone (DECADRON) injection  6 mg Intravenous Q24H   enoxaparin (LOVENOX) injection  40 mg Subcutaneous Q12H   famotidine  20 mg Per Tube BID   feeding supplement (PRO-STAT SUGAR FREE 64)  30 mL Per Tube TID   insulin aspart  0-9 Units Subcutaneous Q4H   mouth rinse  15 mL Mouth Rinse 10 times per day   polyethylene glycol  17 g Per Tube BID   senna  1 tablet Oral BID   tamsulosin  0.4 mg Oral BID   Continuous Infusions:  sodium chloride     feeding supplement (VITAL 1.5 CAL) 40 mL/hr at 07/30/19 0600   fentaNYL infusion INTRAVENOUS 150 mcg/hr (07/30/19 1300)   midazolam 4 mg/hr (07/30/19 1300)   norepinephrine (LEVOPHED) Adult infusion Stopped (07/28/19 2137)     LOS: 5 days   The patient is critically ill with multiple organ systems failure and requires high complexity decision making for assessment and support, frequent evaluation and titration of therapies, application of advanced monitoring technologies and extensive interpretation of multiple databases. Critical Care Time devoted to patient care services described in this note  Time spent: 44 minutes   Little Ishikawa, DO [facility] Pager [phone]  If 7PM-7AM, please contact night-coverage [email or web address] Password [email or web address] 07/30/2019, 2:46 PM

## 2019-07-30 NOTE — Progress Notes (Signed)
Patients head rotated to the left. Z-Flo Fluidized positioner in place.  Arms rotated. No issues. ETT remains secured 27@ lip.

## 2019-07-30 NOTE — Progress Notes (Signed)
Patient placed in supine position at this time.  ETT secured at 29 with commercial tube holder.  No breakdown noted at this time.

## 2019-07-30 NOTE — Progress Notes (Signed)
Patient remains proned. Head repositioned. Arms rotated. No issues.

## 2019-07-30 NOTE — Progress Notes (Signed)
NAME:  Shelley Pooley, MRN:  [id number], DOB:  07-Sep-1951, LOS: 5 ADMISSION DATE:  07/25/2019, CONSULTATION DATE:  10/3 REFERRING MD:  Bridgett Larsson, CHIEF COMPLAINT:  Dyspnea   Brief History   68 y/o male admitted on 10/3 from the [facility] ED with ARDS from COVID pneumonia leading to need for mechanical ventilation.    Past Medical History  COVID 19 Heartburn  Significant Hospital Events   10/3 admission 10/6 prone ventilation Consults:  PCCM  Procedures:  10/2 ETT>  10/2 R IJ CVL >   Significant Diagnostic Tests:  10/2 CT angiogram - extensive bilateral airspace disease predominantly posterior 10/6 CXR - intubated. Patchy bilateral airspace disease.  Micro Data:  9/20 SARS COV 2 > POSITIVE 10/2 blood > negative  Antimicrobials/COVID Rx  10/3 remdesivir > 10/6 10/3 actemra 10/3 decadron > 10/4 convalescent plasma, repeat 10/6  Interim history/subjective:  Prone ventilation re-initiated last pm.   Objective   Blood pressure 113/77, pulse 71, temperature 98.2 F (36.8 C), resp. rate (!) 22, height 5\' 10"  (1.778 m), weight 93.6 kg, SpO2 94 %. CVP:  [16 mmHg] 16 mmHg  Vent Mode: PRVC FiO2 (%):  [45 %] 45 % Set Rate:  [20 bmp] 20 bmp Vt Set:  [510 mL] 510 mL PEEP:  [16 cmH20] 16 cmH20 Plateau Pressure:  [16 cmH20-29 cmH20] 16 cmH20   Intake/Output Summary (Last 24 hours) at 07/30/2019 0954 Last data filed at 07/30/2019 0700 Gross per 24 hour  Intake 1748.34 ml  Output 3850 ml  Net -2101.66 ml   Filed Weights   07/29/19 0323 07/29/19 0500 07/30/19 0500  Weight: 95.9 kg 95 kg 93.6 kg    Examination:  General: prone position, sedated and ventilated, no NMB HENT: NCAT ETT in place, no skin breakdown. PULM: Chest clear, takes occasional spontaneous breaths. Stress/strain Index indicates patient may be over-PEEPed but otherwise lung mechanics are acceptable.  CV: RRR, no mgr GI: prone. Tolerating feeds. MSK: normal bulk and tone Neuro: sedated on vent  Resolved Hospital  Problem list     Assessment & Plan:   Critically ill due to acute hypoxemic respiratory failure requiring mechanical ventilation, prone positioning and titration of sedative infusions to maintain compliance with therapy.  Overall oxygenation has stabilized on prone ventilation. Continue mechanical ventilation per ARDS protocol Target TVol 6-8cc/kgIBW Target Plateau Pressure < 30cm H20 Target driving pressure less than 15 cm of water Target PaO2 55-65: titrate PEEP/FiO2 per protocol As long as PaO2 to FiO2 ratio is less than 1:150 position in prone position for 16 hours a day  Severe ARDS due to COVID 19 pneumonia Plan decadron 10 days Completes remdesivir today Check CVP daily if CVL in place Target CVP less than 4, diurese as necessary  Anticoagulation/DVT prevention in setting of thrombophilia from COVID 19 Cannot enroll in [place] as wife has COVID and we don't have a way for her to undergo consent virtually, she doesn't feel good enough to drive in Continue lovenox 0.5mg /kg bid per protocol   Best practice:  Diet: tube feeding, start today Pain/Anxiety/Delirium protocol (if indicated): as above VAP protocol (if indicated): yes DVT prophylaxis: as above GI prophylaxis: famotidine Glucose control: SSI Mobility: bed rest Code Status: full Family Communication:Dr McQuaid called his wife [name] on 10/6 and gave her an update Disposition: remain in ICU  Labs   CBC: Recent Labs  Lab 07/26/19 0425  07/27/19 0231  07/28/19 0500  07/28/19 1456 07/29/19 0450 07/29/19 0547 07/29/19 1445 07/30/19 0451  WBC 14.0*  --  16.4*  --  14.8*  --   --  10.7*  --   --  9.9  NEUTROABS 12.9*  --  15.2*  --  13.6*  --   --  9.7*  --   --  8.6*  HGB 11.7*   < > 12.4*   < > 11.5*   < > 11.2* 11.3* 15.3 12.6* 12.9*  HCT 36.0*   < > 38.6*   < > 36.9*   < > 33.0* 37.1* 45.0 37.0* 41.9  MCV 93.3  --  94.8  --  97.1  --   --  100.3*  --   --  98.4  PLT 300  --  437*  --  450*  --   --  415*   --   --  411*   < > = values in this interval not displayed.    Basic Metabolic Panel: Recent Labs  Lab 07/26/19 0425  07/27/19 0231  07/28/19 0500  07/28/19 1456 07/29/19 0450 07/29/19 0547 07/29/19 1445 07/30/19 0451  NA 138   < > 144   < > 151*   < > 149* 152* 150* 150* 149*  K 4.3   < > 4.5   < > 4.6   < > 4.6 4.9 4.4 4.4 4.8  CL 102  --  111  --  113*  --   --  115*  --   --  105  CO2 27  --  24  --  29  --   --  28  --   --  31  GLUCOSE 188*  --  214*  --  157*  --   --  151*  --   --  138*  BUN 31*  --  49*  --  56*  --   --  50*  --   --  53*  CREATININE 1.35*  --  1.95*  --  1.75*  --   --  1.27*  --   --  1.43*  CALCIUM 8.0*  --  8.4*  --  8.5*  --   --  8.6*  --   --  9.0  MG 2.7*  --  3.2*  --  3.1*  --   --  2.9*  --   --  2.7*  PHOS 4.1  --  3.4  --  3.2  --   --  2.8  --   --  5.5*   < > = values in this interval not displayed.   GFR: Estimated Creatinine Clearance: 58.4 mL/min (A) (by C-G formula based on SCr of 1.43 mg/dL (H)). Recent Labs  Lab 07/25/19 1656 07/25/19 2005  07/27/19 0231 07/28/19 0500 07/29/19 0450 07/30/19 0451  PROCALCITON 3.62  --   --   --   --   --   --   WBC 10.2  --    < > 16.4* 14.8* 10.7* 9.9  LATICACIDVEN 2.2* 1.2  --   --   --   --   --    < > = values in this interval not displayed.    Liver Function Tests: Recent Labs  Lab 07/26/19 0425 07/27/19 0231 07/28/19 0500 07/29/19 0450 07/30/19 0451  AST 47* 30 25 32 61*  ALT 32 29 27 28  48*  ALKPHOS 49 66 56 58 67  BILITOT 0.8 0.6 0.6 0.4 0.6  PROT 6.3* 6.8 6.2* 5.8* 6.6  ALBUMIN 2.5* 2.6* 2.5* 2.5* 2.9*   No  results for input(s): LIPASE, AMYLASE in the last 168 hours. No results for input(s): AMMONIA in the last 168 hours.  ABG    Component Value Date/Time   PHART 7.441 07/29/2019 1445   PCO2ART 47.4 07/29/2019 1445   PO2ART 60.0 (L) 07/29/2019 1445   HCO3 32.5 (H) 07/29/2019 1445   TCO2 34 (H) 07/29/2019 1445   ACIDBASEDEF 1.0 07/27/2019 0546   O2SAT 92.0  07/29/2019 1445     Coagulation Profile: No results for input(s): INR, PROTIME in the last 168 hours.  Cardiac Enzymes: No results for input(s): CKTOTAL, CKMB, CKMBINDEX, TROPONINI in the last 168 hours.  HbA1C: Hgb A1c MFr Bld  Date/Time Value Ref Range Status  07/26/2019 05:00 AM 6.1 (H) 4.8 - 5.6 % Final    Comment:    (NOTE) Pre diabetes:          5.7%-6.4% Diabetes:              >6.4% Glycemic control for   <7.0% adults with diabetes     CBG: Recent Labs  Lab 07/29/19 1227 07/29/19 1748 07/29/19 2305 07/30/19 0451 07/30/19 0822  GLUCAP 124* 167* 127* 146* 124*     CRITICAL CARE Performed by: Kipp Brood   Total critical care time: 40 minutes  Critical care time was exclusive of separately billable procedures and treating other patients.  Critical care was necessary to treat or prevent imminent or life-threatening deterioration.  Critical care was time spent personally by me on the following activities: development of treatment plan with patient and/or surrogate as well as nursing, discussions with consultants, evaluation of patient's response to treatment, examination of patient, obtaining history from patient or surrogate, ordering and performing treatments and interventions, ordering and review of laboratory studies, ordering and review of radiographic studies, pulse oximetry, re-evaluation of patient's condition and participation in multidisciplinary rounds.  Kipp Brood, MD [facility] ICU Physician [place]  Pager: [phone] Mobile: [phone] After hours: [phone].

## 2019-07-30 NOTE — Progress Notes (Signed)
Post proning ABG obtained per protocol.  Results given to MD.  Orders received to leave supine if oxygenation stays the same.  Will continue to monitor.

## 2019-07-31 ENCOUNTER — Inpatient Hospital Stay (HOSPITAL_COMMUNITY): Payer: Medicare HMO

## 2019-07-31 DIAGNOSIS — R7989 Other specified abnormal findings of blood chemistry: Secondary | ICD-10-CM | POA: Diagnosis not present

## 2019-07-31 DIAGNOSIS — N179 Acute kidney failure, unspecified: Secondary | ICD-10-CM | POA: Diagnosis not present

## 2019-07-31 DIAGNOSIS — E8770 Fluid overload, unspecified: Secondary | ICD-10-CM

## 2019-07-31 DIAGNOSIS — U071 COVID-19: Secondary | ICD-10-CM | POA: Diagnosis not present

## 2019-07-31 DIAGNOSIS — J1289 Other viral pneumonia: Secondary | ICD-10-CM | POA: Diagnosis not present

## 2019-07-31 DIAGNOSIS — J9600 Acute respiratory failure, unspecified whether with hypoxia or hypercapnia: Secondary | ICD-10-CM | POA: Diagnosis not present

## 2019-07-31 LAB — POCT I-STAT 7, (LYTES, BLD GAS, ICA,H+H)
Acid-Base Excess: 7 mmol/L — ABNORMAL HIGH (ref 0.0–2.0)
Acid-Base Excess: 9 mmol/L — ABNORMAL HIGH (ref 0.0–2.0)
Acid-Base Excess: 11 mmol/L — ABNORMAL HIGH (ref 0.0–2.0)
Bicarbonate: 32.7 mmol/L — ABNORMAL HIGH (ref 20.0–28.0)
Bicarbonate: 34.3 mmol/L — ABNORMAL HIGH (ref 20.0–28.0)
Bicarbonate: 36.3 mmol/L — ABNORMAL HIGH (ref 20.0–28.0)
Calcium, Ion: 1.33 mmol/L (ref 1.15–1.40)
Calcium, Ion: 1.28 mmol/L (ref 1.15–1.40)
Calcium, Ion: 1.26 mmol/L (ref 1.15–1.40)
HCT: 38 % — ABNORMAL LOW (ref 39.0–52.0)
HCT: 39 % (ref 39.0–52.0)
HCT: 39 % (ref 39.0–52.0)
Hemoglobin: 12.9 g/dL — ABNORMAL LOW (ref 13.0–17.0)
Hemoglobin: 13.3 g/dL (ref 13.0–17.0)
Hemoglobin: 13.3 g/dL (ref 13.0–17.0)
O2 Saturation: 94 %
O2 Saturation: 90 %
O2 Saturation: 96 %
Patient temperature: 37.1
Patient temperature: 36.9
Patient temperature: 37.5
Potassium: 4.8 mmol/L (ref 3.5–5.1)
Potassium: 4.5 mmol/L (ref 3.5–5.1)
Potassium: 4.9 mmol/L (ref 3.5–5.1)
Sodium: 148 mmol/L — ABNORMAL HIGH (ref 135–145)
Sodium: 148 mmol/L — ABNORMAL HIGH (ref 135–145)
Sodium: 146 mmol/L — ABNORMAL HIGH (ref 135–145)
TCO2: 34 mmol/L — ABNORMAL HIGH (ref 22–32)
TCO2: 36 mmol/L — ABNORMAL HIGH (ref 22–32)
TCO2: 38 mmol/L — ABNORMAL HIGH (ref 22–32)
pCO2 arterial: 52.4 mmHg — ABNORMAL HIGH (ref 32.0–48.0)
pCO2 arterial: 47.3 mmHg (ref 32.0–48.0)
pCO2 arterial: 50.6 mmHg — ABNORMAL HIGH (ref 32.0–48.0)
pH, Arterial: 7.404 (ref 7.350–7.450)
pH, Arterial: 7.468 — ABNORMAL HIGH (ref 7.350–7.450)
pH, Arterial: 7.465 — ABNORMAL HIGH (ref 7.350–7.450)
pO2, Arterial: 72 mmHg — ABNORMAL LOW (ref 83.0–108.0)
pO2, Arterial: 55 mmHg — ABNORMAL LOW (ref 83.0–108.0)
pO2, Arterial: 78 mmHg — ABNORMAL LOW (ref 83.0–108.0)

## 2019-07-31 LAB — COMPREHENSIVE METABOLIC PANEL
ALT: 53 U/L — ABNORMAL HIGH (ref 0–44)
AST: 52 U/L — ABNORMAL HIGH (ref 15–41)
Albumin: 2.7 g/dL — ABNORMAL LOW (ref 3.5–5.0)
Alkaline Phosphatase: 69 U/L (ref 38–126)
Anion gap: 9 (ref 5–15)
BUN: 55 mg/dL — ABNORMAL HIGH (ref 8–23)
CO2: 31 mmol/L (ref 22–32)
Calcium: 9 mg/dL (ref 8.9–10.3)
Chloride: 107 mmol/L (ref 98–111)
Creatinine, Ser: 1.36 mg/dL — ABNORMAL HIGH (ref 0.61–1.24)
GFR calc Af Amer: >60 mL/min (ref >60)
GFR calc non Af Amer: 54 mL/min — ABNORMAL LOW (ref >60)
Glucose, Bld: 149 mg/dL — ABNORMAL HIGH (ref 70–99)
Potassium: 4.9 mmol/L (ref 3.5–5.1)
Sodium: 147 mmol/L — ABNORMAL HIGH (ref 135–145)
Total Bilirubin: 0.4 mg/dL (ref 0.3–1.2)
Total Protein: 6.1 g/dL — ABNORMAL LOW (ref 6.5–8.1)

## 2019-07-31 LAB — CBC
HCT: 41 % (ref 39.0–52.0)
Hemoglobin: 12.5 g/dL — ABNORMAL LOW (ref 13.0–17.0)
MCH: 30.6 pg (ref 26.0–34.0)
MCHC: 30.5 g/dL (ref 30.0–36.0)
MCV: 100.5 fL — ABNORMAL HIGH (ref 80.0–100.0)
Platelets: 354 10*3/uL (ref 150–400)
RBC: 4.08 MIL/uL — ABNORMAL LOW (ref 4.22–5.81)
RDW: 15.1 % (ref 11.5–15.5)
WBC: 14.1 10*3/uL — ABNORMAL HIGH (ref 4.0–10.5)
nRBC: 0.9 % — ABNORMAL HIGH (ref 0.0–0.2)

## 2019-07-31 LAB — GLUCOSE, CAPILLARY
Glucose-Capillary: 153 mg/dL — ABNORMAL HIGH (ref 70–99)
Glucose-Capillary: 173 mg/dL — ABNORMAL HIGH (ref 70–99)
Glucose-Capillary: 163 mg/dL — ABNORMAL HIGH (ref 70–99)
Glucose-Capillary: 162 mg/dL — ABNORMAL HIGH (ref 70–99)
Glucose-Capillary: 172 mg/dL — ABNORMAL HIGH (ref 70–99)
Glucose-Capillary: 170 mg/dL — ABNORMAL HIGH (ref 70–99)

## 2019-07-31 LAB — BPAM FFP
Blood Product Expiration Date: 202010080555
ISSUE DATE / TIME: 202010080149
Unit Type and Rh: 5100

## 2019-07-31 LAB — D-DIMER, QUANTITATIVE: D-Dimer, Quant: >20 ug/mL-FEU — ABNORMAL HIGH (ref 0.00–0.50)

## 2019-07-31 LAB — FERRITIN: Ferritin: 1235 ng/mL — ABNORMAL HIGH (ref 24–336)

## 2019-07-31 LAB — PREPARE FRESH FROZEN PLASMA: Unit division: 0

## 2019-07-31 LAB — C-REACTIVE PROTEIN: CRP: 1.7 mg/dL — ABNORMAL HIGH (ref <1.0)

## 2019-07-31 MED ORDER — FUROSEMIDE 10 MG/ML IJ SOLN
40.0000 mg | Freq: Four times a day (QID) | INTRAMUSCULAR | Status: AC
  Administered 2019-07-31 (×3): 40 mg via INTRAVENOUS
  Filled 2019-07-31 (×3): qty 4

## 2019-07-31 MED ORDER — METOLAZONE 10 MG PO TABS
10.0000 mg | ORAL_TABLET | Freq: Once | ORAL | Status: AC
  Administered 2019-07-31: 10 mg via ORAL
  Filled 2019-07-31: qty 1

## 2019-07-31 MED ORDER — ENOXAPARIN SODIUM 60 MG/0.6ML ~~LOC~~ SOLN
50.0000 mg | Freq: Once | SUBCUTANEOUS | Status: AC
  Administered 2019-07-31: 50 mg via SUBCUTANEOUS
  Filled 2019-07-31: qty 0.6

## 2019-07-31 MED ORDER — SENNOSIDES 8.8 MG/5ML PO SYRP
5.0000 mL | ORAL_SOLUTION | Freq: Two times a day (BID) | ORAL | Status: DC
  Administered 2019-08-01 – 2019-08-14 (×21): 5 mL
  Filled 2019-07-31 (×23): qty 5

## 2019-07-31 MED ORDER — FREE WATER
250.0000 mL | Status: DC
  Administered 2019-07-31 – 2019-08-03 (×19): 250 mL

## 2019-07-31 MED ORDER — ENOXAPARIN SODIUM 100 MG/ML ~~LOC~~ SOLN
90.0000 mg | Freq: Two times a day (BID) | SUBCUTANEOUS | Status: DC
  Administered 2019-07-31 – 2019-08-02 (×5): 90 mg via SUBCUTANEOUS
  Filled 2019-07-31 (×6): qty 1

## 2019-07-31 NOTE — Progress Notes (Signed)
Nutrition Follow-up  DOCUMENTATION CODES:   Not applicable  INTERVENTION:   Continue:  Vital 1.5 @ 55 ml/hr via OG tube 30 ml Prostat TID   Provides: 2280 kcal, 134 grams protein, and 1008 ml free water. Total free water: 2508 ml    NUTRITION DIAGNOSIS:   Inadequate oral intake related to inability to eat(pt sedated and ventilated) as evidenced by NPO status.  Ongoing.   GOAL:   Provide needs based on ASPEN/SCCM guidelines  Met.   MONITOR:   Vent status, Labs, Weight trends, TF tolerance, Skin, I & O's  REASON FOR ASSESSMENT:   Consult Enteral/tube feeding initiation and management  ASSESSMENT:   68 y/o male admitted with PNA and COVID 19  Prone positioning discontinued  Patient is currently intubated on ventilator support MV: 9 L/min Temp (24hrs), Avg:98.5 F (36.9 C), Min:97.2 F (36.2 C), Max:100.2 F (37.9 C)  Medications reviewed and include: decadron, lasix, SSI, miralax, senokot, fentanyl and versed for sedation 250 ml free water every 4 hours = 1500 ml  Labs reviewed: Na 147 (H) 16 F OG tube    NUTRITION - FOCUSED PHYSICAL EXAM:  Deferred; RD working remotely   Diet Order:   Diet Order            Diet NPO time specified  Diet effective now              EDUCATION NEEDS:   No education needs have been identified at this time  Skin:  Skin Assessment: Reviewed RN Assessment  Last BM:  10/2  Height:   Ht Readings from Last 1 Encounters:  07/26/19 '5\' 10"'$  (1.778 m)    Weight:   Wt Readings from Last 1 Encounters:  07/31/19 92.8 kg    Ideal Body Weight:  75.45 kg  BMI:  Body mass index is 29.36 kg/m.  Estimated Nutritional Needs:   Kcal:  2046  Protein:  125-135g  Fluid:  >1.9L/day  Maylon Peppers RD, [place], CNSC [phone] Pager [phone] After Hours Pager

## 2019-07-31 NOTE — Progress Notes (Signed)
NAME:  [name], MRN:  [id number], DOB:  1950/12/19, LOS: 6 ADMISSION DATE:  07/25/2019, CONSULTATION DATE:  10/3 REFERRING MD:  Bridgett Larsson, CHIEF COMPLAINT:  Dyspnea   Brief History   68 y/o male admitted on 10/3 from the [facility] ED with ARDS from COVID pneumonia leading to need for mechanical ventilation.    Past Medical History  COVID 19 Heartburn  Significant Hospital Events   10/3 admission 10/6 prone ventilation Consults:  PCCM  Procedures:  10/2 ETT>  10/2 R IJ CVL >   Significant Diagnostic Tests:  10/2 CT angiogram - extensive bilateral airspace disease predominantly posterior 10/6 CXR - intubated. Patchy bilateral airspace disease.  Micro Data:  9/20 SARS COV 2 > POSITIVE 10/2 blood > negative  Antimicrobials/COVID Rx  10/3 remdesivir > 10/6 10/3 actemra 10/3 decadron > 10/4 convalescent plasma, repeat 10/6  Interim history/subjective:  No events overnight Oxygenation improving   Objective   Blood pressure 111/75, pulse 88, temperature 99.3 F (37.4 C), resp. rate (!) 24, height 5\' 10"  (1.778 m), weight 92.8 kg, SpO2 92 %. CVP:  [9 mmHg] 9 mmHg  Vent Mode: PRVC FiO2 (%):  [45 %] 45 % Set Rate:  [20 bmp] 20 bmp Vt Set:  [510 mL] 510 mL PEEP:  [14 cmH20-16 cmH20] 14 cmH20 Plateau Pressure:  [23 cmH20-27 cmH20] 26 cmH20   Intake/Output Summary (Last 24 hours) at 07/31/2019 3976 Last data filed at 07/31/2019 0700 Gross per 24 hour  Intake 1916.62 ml  Output 1135 ml  Net 781.62 ml   Filed Weights   07/29/19 0500 07/30/19 0500 07/31/19 0400  Weight: 95 kg 93.6 kg 92.8 kg   Examination:  General: Supine, acutely ill appearing, NAD HENT: [place]/AT, PERRL, EOM-I and MMM, ETT in place PULM: Coarse diffusely CV: RRR, Nl S1/S2 and -M/R/G GI: Soft, NT, ND and +BS MSK: -edema and -tenderness Neuro: sedated on vent  I reviewed CXR myself, ETT is in a good position, infiltrate noted  Resolved Hospital Problem list     Assessment & Plan:   Critically ill  due to acute hypoxemic respiratory failure requiring mechanical ventilation, prone positioning and titration of sedative infusions to maintain compliance with therapy.  D/C prone ventilation Continue mechanical ventilation per ARDS protocol Target TVol 6-8cc/kgIBW Target Plateau Pressure < 30cm H20 Target driving pressure less than 15 cm of water Target PaO2 55-65: titrate PEEP/FiO2 per protocol Change to PCV as ordered with f/u ABG  Severe ARDS due to COVID 19 pneumonia Decadron for 10 days Completed remdesivir 10/7 Active diureses today  Anticoagulation/DVT prevention in setting of thrombophilia from COVID 19 Cannot enroll in [place] as wife has COVID and we don't have a way for her to undergo consent virtually, she doesn't feel good enough to drive in Continue lovenox 0.5mg /kg bid per protocol   Best practice:  Diet: tube feeding, start today Pain/Anxiety/Delirium protocol (if indicated): as above VAP protocol (if indicated): yes DVT prophylaxis: as above GI prophylaxis: famotidine Glucose control: SSI Mobility: bed rest Code Status: full Family Communication:Dr McQuaid called his wife [name] on 10/6 and gave her an update Disposition: remain in ICU  Labs   CBC: Recent Labs  Lab 07/26/19 0425  07/27/19 0231  07/28/19 0500  07/29/19 0450  07/29/19 1445 07/30/19 0451 07/30/19 1513 07/31/19 0355 07/31/19 0500  WBC 14.0*  --  16.4*  --  14.8*  --  10.7*  --   --  9.9  --   --  14.1*  NEUTROABS  12.9*  --  15.2*  --  13.6*  --  9.7*  --   --  8.6*  --   --   --   HGB 11.7*   < > 12.4*   < > 11.5*   < > 11.3*   < > 12.6* 12.9* 13.3 12.9* 12.5*  HCT 36.0*   < > 38.6*   < > 36.9*   < > 37.1*   < > 37.0* 41.9 39.0 38.0* 41.0  MCV 93.3  --  94.8  --  97.1  --  100.3*  --   --  98.4  --   --  100.5*  PLT 300  --  437*  --  450*  --  415*  --   --  411*  --   --  354   < > = values in this interval not displayed.    Basic Metabolic Panel: Recent Labs  Lab 07/26/19 0425   07/27/19 0231  07/28/19 0500  07/29/19 0450  07/29/19 1445 07/30/19 0451 07/30/19 1513 07/31/19 0355 07/31/19 0500  NA 138   < > 144   < > 151*   < > 152*   < > 150* 149* 149* 148* 147*  K 4.3   < > 4.5   < > 4.6   < > 4.9   < > 4.4 4.8 4.9 4.8 4.9  CL 102  --  111  --  113*  --  115*  --   --  105  --   --  107  CO2 27  --  24  --  29  --  28  --   --  31  --   --  31  GLUCOSE 188*  --  214*  --  157*  --  151*  --   --  138*  --   --  149*  BUN 31*  --  49*  --  56*  --  50*  --   --  53*  --   --  55*  CREATININE 1.35*  --  1.95*  --  1.75*  --  1.27*  --   --  1.43*  --   --  1.36*  CALCIUM 8.0*  --  8.4*  --  8.5*  --  8.6*  --   --  9.0  --   --  9.0  MG 2.7*  --  3.2*  --  3.1*  --  2.9*  --   --  2.7*  --   --   --   PHOS 4.1  --  3.4  --  3.2  --  2.8  --   --  5.5*  --   --   --    < > = values in this interval not displayed.   GFR: Estimated Creatinine Clearance: 61.1 mL/min (A) (by C-G formula based on SCr of 1.36 mg/dL (H)). Recent Labs  Lab 07/25/19 1656 07/25/19 2005  07/28/19 0500 07/29/19 0450 07/30/19 0451 07/31/19 0500  PROCALCITON 3.62  --   --   --   --   --   --   WBC 10.2  --    < > 14.8* 10.7* 9.9 14.1*  LATICACIDVEN 2.2* 1.2  --   --   --   --   --    < > = values in this interval not displayed.    Liver Function Tests: Recent Labs  Lab 07/27/19 0231 07/28/19  0500 07/29/19 0450 07/30/19 0451 07/31/19 0500  AST 30 25 32 61* 52*  ALT 29 27 28  48* 53*  ALKPHOS 66 56 58 67 69  BILITOT 0.6 0.6 0.4 0.6 0.4  PROT 6.8 6.2* 5.8* 6.6 6.1*  ALBUMIN 2.6* 2.5* 2.5* 2.9* 2.7*   No results for input(s): LIPASE, AMYLASE in the last 168 hours. No results for input(s): AMMONIA in the last 168 hours.  ABG    Component Value Date/Time   PHART 7.404 07/31/2019 0355   PCO2ART 52.4 (H) 07/31/2019 0355   PO2ART 72.0 (L) 07/31/2019 0355   HCO3 32.7 (H) 07/31/2019 0355   TCO2 34 (H) 07/31/2019 0355   ACIDBASEDEF 1.0 07/27/2019 0546   O2SAT 94.0  07/31/2019 0355     Coagulation Profile: No results for input(s): INR, PROTIME in the last 168 hours.  Cardiac Enzymes: No results for input(s): CKTOTAL, CKMB, CKMBINDEX, TROPONINI in the last 168 hours.  HbA1C: Hgb A1c MFr Bld  Date/Time Value Ref Range Status  07/26/2019 05:00 AM 6.1 (H) 4.8 - 5.6 % Final    Comment:    (NOTE) Pre diabetes:          5.7%-6.4% Diabetes:              >6.4% Glycemic control for   <7.0% adults with diabetes     CBG: Recent Labs  Lab 07/30/19 1214 07/30/19 1536 07/30/19 2012 07/30/19 2321 07/31/19 0351  GLUCAP 160* 167* 163* 163* 153*   The patient is critically ill with multiple organ systems failure and requires high complexity decision making for assessment and support, frequent evaluation and titration of therapies, application of advanced monitoring technologies and extensive interpretation of multiple databases.   Critical Care Time devoted to patient care services described in this note is  32  Minutes. This time reflects time of care of this signee Dr Jennet Maduro. This critical care time does not reflect procedure time, or teaching time or supervisory time of PA/NP/Med student/Med Resident etc but could involve care discussion time.  Rush Farmer, M.D. [facility] Pulmonary/Critical Care Medicine. Pager: [phone]. After hours pager: [phone].

## 2019-07-31 NOTE — Progress Notes (Signed)
Lower extremity venous has been completed.   Preliminary results in CV Proc.   Abram Sander 07/31/2019 11:42 AM

## 2019-07-31 NOTE — Progress Notes (Addendum)
ANTICOAGULATION CONSULT NOTE - Initial Consult  Pharmacy Consult for enoxaparin Indication: VTE Treatment  No Known Allergies  Patient Measurements: Height: '5\' 10"'$  (177.8 cm) Weight: 204 lb 9.4 oz (92.8 kg) IBW/kg (Calculated) : 73  Vital Signs: Temp: 100.2 F (37.9 C) (10/08 1120) Temp Source: Axillary (10/08 0900) BP: 102/64 (10/08 1120) Pulse Rate: 102 (10/08 1120)  Labs: Recent Labs    07/29/19 0450  07/30/19 0451  07/31/19 0355 07/31/19 0500 07/31/19 1057  HGB 11.3*   < > 12.9*   < > 12.9* 12.5* 13.3  HCT 37.1*   < > 41.9   < > 38.0* 41.0 39.0  PLT 415*  --  411*  --   --  354  --   CREATININE 1.27*  --  1.43*  --   --  1.36*  --    < > = values in this interval not displayed.    Estimated Creatinine Clearance: 61.1 mL/min (A) (by C-G formula based on SCr of 1.36 mg/dL (H)).   Medical History: Past Medical History:  Diagnosis Date  . COVID-19   . Heartburn     Medications:  Scheduled:  . sodium chloride   Intravenous Once  . chlorhexidine gluconate (MEDLINE KIT)  15 mL Mouth Rinse BID  . Chlorhexidine Gluconate Cloth  6 each Topical Daily  . dexamethasone (DECADRON) injection  6 mg Intravenous Q24H  . enoxaparin (LOVENOX) injection  40 mg Subcutaneous Q12H  . famotidine  20 mg Per Tube BID  . feeding supplement (PRO-STAT SUGAR FREE 64)  30 mL Per Tube TID  . free water  250 mL Per Tube Q4H  . furosemide  40 mg Intravenous Q6H  . insulin aspart  0-9 Units Subcutaneous Q4H  . mouth rinse  15 mL Mouth Rinse 10 times per day  . polyethylene glycol  17 g Per Tube BID  . sennosides  5 mL Per Tube BID  . tamsulosin  0.4 mg Oral BID    Assessment: 68 yo male admitted on 07/25/2019 for COVID-19. Pharmacy has been consulted for enoxaparin for suspected VTE. D-dimer on admission was >20 and continued to be >20 daily. Patient is currently on enoxaparin '40mg'$  q12h for DVT prophylaxis which was dosed appropriately for a patient in the ICU with CrCl >30 and not  obese (BMI<35).  Last dose of prophylactic enoxaparin '40mg'$  administered at approximately 0830 on 10/8. Will give a partial dose of enoxaparin now to achieve total desired therapeutic dose. CBC is stable and no bleeding is reported.    Doppler ultrasound on 10/8 was negative for DVT. Chest CT on 10/2 was negative for PE  Goal of Therapy:  Anti-Xa level 0.6-1 units/ml 4hrs after LMWH dose given Monitor platelets by anticoagulation protocol: Yes   Plan:  Give enoxaparin '50mg'$  now to cover remainder of desired therapeutic dose Start enoxaparin '90mg'$  SQ q12h at 2200 (when next dose of prophylactic enoxaparin would be due) Follow up plans for possible repeat CT angiogram Monitor for S/S of bleeding    Cristela Felt, PharmD PGY1 Pharmacy Resident  07/31/2019,11:46 AM

## 2019-07-31 NOTE — Progress Notes (Signed)
Attempted to turn off pt's versed gtt. After about an hour of it being off pt was coughing and dyssynchronous on the vent. A bite block was placed by RT per MD request d/t to pt also biting on his tube and his SP02 staying in the 80s. Versed gtt was restarted at 1mg . During this time pt did become more responsive, however he was not following commands. Dr Avon Gully was made aware of the attempt. Will continue to monitor patient.

## 2019-07-31 NOTE — Progress Notes (Signed)
Updated pt's wife [name]. She was very appreciative of the call. I was able to answer any questions she had and updated her on his night last night and his plan of care for today.

## 2019-07-31 NOTE — Progress Notes (Addendum)
PROGRESS NOTE    [name]  HWE:[id number] DOB: 1951-01-16 DOA: 07/25/2019 PCP: Kathyrn Drown, MD   Brief Narrative:  68 year old BM PMHx GERD, BPH  Presented to [facility] ER with shortness of breath and hypoxia.  Patient was diagnosed with COVID 19 on July 13 9019.  Patient's wife also has COVID-19 but is asymptomatic.  Over the last 1 to 2 days, the patient's shortness of breath is increased.  Patient has been suffering with anorexia.  Patient's wife was actually having a conversation via teleconference with the patient's PCP.  PCP is also otherwise physician.  He had actually called to check on how the wife was doing.  The wife had mentioned to the physician that the patient has been laying in bed.  When the physician listened to how the patient was speaking over the phone, it was noted the patient was tachypneic.  EMS was activated.  When EMS arrived, the patient's O2 saturations were apparently in the 40 percentile range.  Patient was placed on nonrebreather.  He was transferred to the ER.  In the ER, his oxygen saturations on 100% nonrebreather were only in the 80s.  Patient was confused.  Patient was emergently intubated.  Patient given IV steroids.  Initial chest x-ray demonstrates diffuse multifocal infiltrates.  CT scan of the chest was negative for PE.  ABG prior to leaving for [place] demonstrates a pH 7.41 PCO2 44 PO2 of 87 on FiO2 100%.  AA gradient calculates out to 571. In ED patient was emergently intubated. Central line placed. Started on IV Decadron.  Subjective: No acute issues or events overnight, patient continues to be ventilated, tolerating sedation quite well, currently awake and able to follow simple commands  Assessment & Plan:   Principal Problem:   Pneumonia due to COVID-19 virus Active Problems:   BPH (benign prostatic hyperplasia)   Acute respiratory failure due to COVID-19 ([place])   AKI (acute kidney injury) ([place])   Fluid overload    COVID pneumonia/acute respiratory stress with hypoxia Vent Mode: PCV FiO2 (%):  [40 %-50 %] 50 % Set Rate:  [15 bmp-20 bmp] 15 bmp Vt Set:  [510 mL] 510 mL PEEP:  [10 cmH20-16 cmH20] 12 cmH20 Plateau Pressure:  [23 cmH20-26 cmH20] 26 cmH20 - Continue Decadron - stop date 10/12 - Remdesivir Completed - 10/2 Actemra x1 dose - CRP improving appropriately - 10/3 transfused 1 unit convalescent plasma - Continue incentive spirometry, shutter valve as patient tolerates - no proning today - CT PE negative at admission - unlikely DVT given BLE [place] preliminary negative - 10/6 PCXR generally improving from previous Recent Labs    07/29/19 0450 07/30/19 0451 07/31/19 0500  DDIMER >20.00* >20.00* >20.00*  FERRITIN  --   --  1,235*  CRP 5.1* 3.0* 1.7*    Volume overload, improving -Diuresing today w/ critical care Intake/Output Summary (Last 24 hours) at 07/31/2019 1345 Last data filed at 07/31/2019 1200 Gross per 24 hour  Intake 2207.91 ml  Output 2310 ml  Net -102.09 ml    Acute kidney injury, improving Recent Labs  Lab 07/27/19 0231 07/28/19 0500 07/29/19 0450 07/30/19 0451 07/31/19 0500  CREATININE 1.95* 1.75* 1.27* 1.43* 1.36*  - Previously trending upwards in the setting of lasix - continue to follow - Furosemide restarted with critcare  Urinary obstruction - without known history of BPH -Patient does not appear to be on any medications at home; unclear diagnosis for BPH -Follow closely for urinary retention - likely remove  foley once extubated and ambulatory  DVT prophylaxis: Lovenox - full dose as above given elevated D-dimer Code Status: Full Family communication: Attempted to call [name] (spouse) at 36 - no answer Disposition Plan: TBD, pending clinical improvement  Consultants:  PCCM  Procedures/Significant Events:  10/2 CTA chest Negative for PE 10/2 PCXR;-Endotracheal tube remains with tip above the thoracic inlet. Recommend repositioning and advancement.  Esophagogastric tube with tip and side port below the diaphragm. - Unchanged diffuse, extensive bilateral heterogeneous opacity, consistent with multifocal infection, edema, and/or ARDS. 10/3 PCXR; slightly improved extensive bilateral heterogeneous opacifications (my read) 10/3 transfuse 1 unit CCP 10/6 PCXR - Generally improving aeration/diminished opacifications (R>L)  I have personally reviewed and interpreted all radiology studies and my findings are as above.  Cultures 9/29 Novel coronavirus positive 10/2 blood LEFT antecubital NGTD 10/2 blood hand NGTD 10/3 MRSA by PCR negative  Antimicrobials: Anti-infectives (From admission, onward)   Start     Stop   07/26/19 1600  remdesivir 100 mg in sodium chloride 0.9 % 250 mL IVPB     07/30/19 1559   07/26/19 0215  remdesivir 200 mg in sodium chloride 0.9 % 250 mL IVPB     07/26/19 0520     LINES / TUBES:  #7.5 ETT 10/2>> RIGHT IJ CVL>>>  Continuous Infusions: . sodium chloride    . feeding supplement (VITAL 1.5 CAL) 1,000 mL (07/30/19 1542)  . fentaNYL infusion INTRAVENOUS 100 mcg/hr (07/31/19 1200)  . midazolam 2 mg/hr (07/31/19 1200)  . norepinephrine (LEVOPHED) Adult infusion Stopped (07/28/19 2137)     Objective: Vitals:   07/31/19 1000 07/31/19 1100 07/31/19 1120 07/31/19 1200  BP: 113/78 102/64 102/64 98/71  Pulse: (!) 102 (!) 102 (!) 102 (!) 101  Resp: _0 Temp: 100.2 F (37.9 C) 100 F (37.8 C) 100.2 F (37.9 C) 100 F (37.8 C)  TempSrc:      SpO2: 90% 91% 91% 93%  Weight:      Height:        Intake/Output Summary (Last 24 hours) at 07/31/2019 1345 Last data filed at 07/31/2019 1200 Gross per 24 hour  Intake 2207.91 ml  Output 2310 ml  Net -102.09 ml   Filed Weights   07/29/19 0500 07/30/19 0500 07/31/19 0400  Weight: 95 kg 93.6 kg 92.8 kg   Physical Exam:  General: Sedated/intubated, prone, no acute distress Eyes: negative scleral hemorrhage, negative anisocoria, negative icterus  ENT: Negative Runny nose, negative gingival bleeding, #7.5 ETT tube in place Neck:  Negative scars, masses, torticollis, lymphadenopathy, JVD, RIGHT IJ CVL in place Lungs: diminished bilaterally without overt wheezes or rales Cardiovascular: Bradycardic, without murmur gallop or rub normal S1 and S2 Abdomen: negative abdominal pain, nondistended, hypoactive bowel sounds, no rebound, no ascites, no appreciable mass Extremities: No significant cyanosis, clubbing Skin: Negative rashes, lesions, ulcers  Data Reviewed: Care during the described time interval was provided by me .  I have reviewed this patient's available data, including medical history, events of note, physical examination, and all test results as part of my evaluation.   CBC: Recent Labs  Lab 07/26/19 0425  07/27/19 0231  07/28/19 0500  07/29/19 0450  07/30/19 0451 07/30/19 1513 07/31/19 0355 07/31/19 0500 07/31/19 1057  WBC 14.0*  --  16.4*  --  14.8*  --  10.7*  --  9.9  --   --  14.1*  --   NEUTROABS 12.9*  --  15.2*  --  13.6*  --  9.7*  --  8.6*  --   --   --   --   HGB 11.7*   < > 12.4*   < > 11.5*   < > 11.3*   < > 12.9* 13.3 12.9* 12.5* 13.3  HCT 36.0*   < > 38.6*   < > 36.9*   < > 37.1*   < > 41.9 39.0 38.0* 41.0 39.0  MCV 93.3  --  94.8  --  97.1  --  100.3*  --  98.4  --   --  100.5*  --   PLT 300  --  437*  --  450*  --  415*  --  411*  --   --  354  --    < > = values in this interval not displayed.   Basic Metabolic Panel: Recent Labs  Lab 07/26/19 0425  07/27/19 0231  07/28/19 0500  07/29/19 0450  07/30/19 0451 07/30/19 1513 07/31/19 0355 07/31/19 0500 07/31/19 1057  NA 138   < > 144   < > 151*   < > 152*   < > 149* 149* 148* 147* 148*  K 4.3   < > 4.5   < > 4.6   < > 4.9   < > 4.8 4.9 4.8 4.9 4.5  CL 102  --  111  --  113*  --  115*  --  105  --   --  107  --   CO2 27  --  24  --  29  --  28  --  31  --   --  31  --   GLUCOSE 188*  --  214*  --  157*  --  151*  --  138*  --   --  149*  --    BUN 31*  --  49*  --  56*  --  50*  --  53*  --   --  55*  --   CREATININE 1.35*  --  1.95*  --  1.75*  --  1.27*  --  1.43*  --   --  1.36*  --   CALCIUM 8.0*  --  8.4*  --  8.5*  --  8.6*  --  9.0  --   --  9.0  --   MG 2.7*  --  3.2*  --  3.1*  --  2.9*  --  2.7*  --   --   --   --   PHOS 4.1  --  3.4  --  3.2  --  2.8  --  5.5*  --   --   --   --    < > = values in this interval not displayed.   GFR: Estimated Creatinine Clearance: 61.1 mL/min (A) (by C-G formula based on SCr of 1.36 mg/dL (H)).  Liver Function Tests: Recent Labs  Lab 07/27/19 0231 07/28/19 0500 07/29/19 0450 07/30/19 0451 07/31/19 0500  AST 30 25 32 61* 52*  ALT _0 48* 53*  ALKPHOS 66 56 58 67 69  BILITOT 0.6 0.6 0.4 0.6 0.4  PROT 6.8 6.2* 5.8* 6.6 6.1*  ALBUMIN 2.6* 2.5* 2.5* 2.9* 2.7*   CBG: Recent Labs  Lab 07/30/19 2012 07/30/19 2321 07/31/19 0351 07/31/19 0820 07/31/19 1209  GLUCAP 163* 163* 153* 173* 163*   Urine analysis:    Component Value Date/Time   COLORURINE YELLOW 07/25/2019 1713   APPEARANCEUR HAZY (A) 07/25/2019 1713   LABSPEC 1.027 07/25/2019  [place] 5.0 07/25/2019 1713   GLUCOSEU NEGATIVE 07/25/2019 1713   HGBUR MODERATE (A) 07/25/2019 1713   BILIRUBINUR NEGATIVE 07/25/2019 1713   [place] 07/25/2019 1713   PROTEINUR 100 (A) 07/25/2019 1713   NITRITE NEGATIVE 07/25/2019 1713   LEUKOCYTESUR NEGATIVE 07/25/2019 1713    Recent Results (from the past 240 hour(s))  Novel Coronavirus, NAA ([facility])     Status: Abnormal   Collection Time: 07/22/19 12:00 AM   Specimen: Oropharyngeal(OP) collection in vial transport medium   OROPHARYNGEA  TESTING  Result Value Ref Range Status   SARS-CoV-2, NAA Detected (A) Not Detected Final    Comment: This nucleic acid amplification test was developed and its performance characteristics determined by [employer]. Nucleic acid amplification tests include PCR and TMA. This test has not been FDA cleared or  approved. This test has been authorized by FDA under an Emergency Use Authorization (EUA). This test is only authorized for the duration of time the declaration that circumstances exist justifying the authorization of the emergency use of in vitro diagnostic tests for detection of SARS-CoV-2 virus and/or diagnosis of COVID-19 infection under section 564(b)(1) of the Act, 21 U.S.C. 923RAQ-7(M) (1), unless the authorization is terminated or revoked sooner. When diagnostic testing is negative, the possibility of a false negative result should be considered in the context of a patient's recent exposures and the presence of clinical signs and symptoms consistent with COVID-19. An individual without symptoms of COVID-19 and who is not shedding SARS-CoV-2 virus would  expect to have a negative (not detected) result in this assay.   Blood Culture (routine x 2)     Status: None   Collection Time: 07/25/19  4:56 PM   Specimen: Left Antecubital; Blood  Result Value Ref Range Status   Specimen Description LEFT ANTECUBITAL  Final   Special Requests   Final    BOTTLES DRAWN AEROBIC AND ANAEROBIC Blood Culture results may not be optimal due to an excessive volume of blood received in culture bottles   Culture   Final    NO GROWTH 5 DAYS Performed at [facility], [address] [place], [place] [address]    Report Status 07/30/2019 FINAL  Final  Blood Culture (routine x 2)     Status: None   Collection Time: 07/25/19  5:01 PM   Specimen: BLOOD RIGHT HAND  Result Value Ref Range Status   Specimen Description BLOOD RIGHT HAND  Final   Special Requests   Final    BOTTLES DRAWN AEROBIC AND ANAEROBIC Blood Culture adequate volume   Culture   Final    NO GROWTH 5 DAYS Performed at [facility], [address] [place], [place] [address]    Report Status 07/30/2019 FINAL  Final  MRSA PCR Screening     Status: None   Collection Time: 07/26/19  2:09 AM   Specimen: Nasal Mucosa; Nasopharyngeal   Result Value Ref Range Status   MRSA by PCR NEGATIVE NEGATIVE Final    Comment:        The GeneXpert MRSA Assay (FDA approved for NASAL specimens only), is one component of a comprehensive MRSA colonization surveillance program. It is not intended to diagnose MRSA infection nor to guide or monitor treatment for MRSA infections. Performed at [facility], [place] [address], [place], [place] [address]      Radiology Studies: Dg Chest Port 1 View  Result Date: 07/29/2019 CLINICAL DATA:  Acute respiratory failure. COVID-19 infection. EXAM: PORTABLE CHEST  1 VIEW COMPARISON:  Radiographs 07/29/2019 and 07/26/2019. CT 07/25/2019. FINDINGS: 1409 hours. The position of the endotracheal tube, enteric tube and right IJ central venous catheter has not significantly changed. The heart size and mediastinal contours are stable. There has been further partial clearing of the patchy bilateral airspace opacities. No pneumothorax or enlarging pleural effusion identified. The bones appear normal. IMPRESSION: Further partial clearing of patchy bilateral airspace opacities consistent with improving pneumonia. Stable position of the support system. Electronically Signed   By: Richardean Sale M.D.   On: 07/29/2019 15:57   Vas [place] Lower Extremity Venous (dvt)  Result Date: 07/31/2019  Lower Venous Study Indications: Elevated d-dimer.  Comparison Study: no prior Performing Technologist: Abram Sander RVS  Examination Guidelines: A complete evaluation includes B-mode imaging, spectral Doppler, color Doppler, and power Doppler as needed of all accessible portions of each vessel. Bilateral testing is considered an integral part of a complete examination. Limited examinations for reoccurring indications may be performed as noted.  +---------+---------------+---------+-----------+----------+--------------+ RIGHT    CompressibilityPhasicitySpontaneityPropertiesThrombus Aging  +---------+---------------+---------+-----------+----------+--------------+ CFV      Full           Yes      Yes                                 +---------+---------------+---------+-----------+----------+--------------+ SFJ      Full                                                        +---------+---------------+---------+-----------+----------+--------------+ FV Prox  Full                                                        +---------+---------------+---------+-----------+----------+--------------+ FV Mid   Full                                                        +---------+---------------+---------+-----------+----------+--------------+ FV DistalFull                                                        +---------+---------------+---------+-----------+----------+--------------+ PFV      Full                                                        +---------+---------------+---------+-----------+----------+--------------+ POP      Full           Yes      Yes                                 +---------+---------------+---------+-----------+----------+--------------+ PTV  Full                                                        +---------+---------------+---------+-----------+----------+--------------+ PERO     Full                                                        +---------+---------------+---------+-----------+----------+--------------+   +---------+---------------+---------+-----------+----------+--------------+ LEFT     CompressibilityPhasicitySpontaneityPropertiesThrombus Aging +---------+---------------+---------+-----------+----------+--------------+ CFV      Full           Yes      Yes                                 +---------+---------------+---------+-----------+----------+--------------+ SFJ      Full                                                         +---------+---------------+---------+-----------+----------+--------------+ FV Prox  Full                                                        +---------+---------------+---------+-----------+----------+--------------+ FV Mid   Full                                                        +---------+---------------+---------+-----------+----------+--------------+ FV DistalFull                                                        +---------+---------------+---------+-----------+----------+--------------+ PFV      Full                                                        +---------+---------------+---------+-----------+----------+--------------+ POP      Full           Yes      Yes                                 +---------+---------------+---------+-----------+----------+--------------+ PTV      Full                                                        +---------+---------------+---------+-----------+----------+--------------+  PERO     Full                                                        +---------+---------------+---------+-----------+----------+--------------+     Summary: Right: There is no evidence of deep vein thrombosis in the lower extremity. No cystic structure found in the popliteal fossa. Left: There is no evidence of deep vein thrombosis in the lower extremity. No cystic structure found in the popliteal fossa.  *See table(s) above for measurements and observations.    Preliminary     Scheduled Meds: . sodium chloride   Intravenous Once  . chlorhexidine gluconate (MEDLINE KIT)  15 mL Mouth Rinse BID  . Chlorhexidine Gluconate Cloth  6 each Topical Daily  . dexamethasone (DECADRON) injection  6 mg Intravenous Q24H  . enoxaparin (LOVENOX) injection  90 mg Subcutaneous Q12H  . famotidine  20 mg Per Tube BID  . feeding supplement (PRO-STAT SUGAR FREE 64)  30 mL Per Tube TID  . free water  250 mL Per Tube Q4H  . furosemide  40 mg  Intravenous Q6H  . insulin aspart  0-9 Units Subcutaneous Q4H  . mouth rinse  15 mL Mouth Rinse 10 times per day  . polyethylene glycol  17 g Per Tube BID  . sennosides  5 mL Per Tube BID  . tamsulosin  0.4 mg Oral BID   Continuous Infusions: . sodium chloride    . feeding supplement (VITAL 1.5 CAL) 1,000 mL (07/30/19 1542)  . fentaNYL infusion INTRAVENOUS 100 mcg/hr (07/31/19 1200)  . midazolam 2 mg/hr (07/31/19 1200)  . norepinephrine (LEVOPHED) Adult infusion Stopped (07/28/19 2137)     LOS: 6 days   The patient is critically ill with multiple organ systems failure and requires high complexity decision making for assessment and support, frequent evaluation and titration of therapies, application of advanced monitoring technologies and extensive interpretation of multiple databases. Critical Care Time devoted to patient care services described in this note  Time spent: 38 minutes   Little Ishikawa, DO [facility] Pager [phone]  If 7PM-7AM, please contact night-coverage [email or web address] Password [email or web address] 07/31/2019, 1:45 PM

## 2019-08-01 ENCOUNTER — Inpatient Hospital Stay (HOSPITAL_COMMUNITY): Payer: Medicare HMO

## 2019-08-01 DIAGNOSIS — E8770 Fluid overload, unspecified: Secondary | ICD-10-CM | POA: Diagnosis not present

## 2019-08-01 DIAGNOSIS — J1289 Other viral pneumonia: Secondary | ICD-10-CM | POA: Diagnosis not present

## 2019-08-01 DIAGNOSIS — N179 Acute kidney failure, unspecified: Secondary | ICD-10-CM | POA: Diagnosis not present

## 2019-08-01 DIAGNOSIS — J9600 Acute respiratory failure, unspecified whether with hypoxia or hypercapnia: Secondary | ICD-10-CM | POA: Diagnosis not present

## 2019-08-01 DIAGNOSIS — U071 COVID-19: Secondary | ICD-10-CM | POA: Diagnosis not present

## 2019-08-01 DIAGNOSIS — J181 Lobar pneumonia, unspecified organism: Secondary | ICD-10-CM | POA: Diagnosis not present

## 2019-08-01 LAB — POCT I-STAT 7, (LYTES, BLD GAS, ICA,H+H)
Acid-Base Excess: 14 mmol/L — ABNORMAL HIGH (ref 0.0–2.0)
Bicarbonate: 39.3 mmol/L — ABNORMAL HIGH (ref 20.0–28.0)
Calcium, Ion: 1.22 mmol/L (ref 1.15–1.40)
HCT: 39 % (ref 39.0–52.0)
Hemoglobin: 13.3 g/dL (ref 13.0–17.0)
O2 Saturation: 93 %
Patient temperature: 37.9
Potassium: 4 mmol/L (ref 3.5–5.1)
Sodium: 143 mmol/L (ref 135–145)
TCO2: 41 mmol/L — ABNORMAL HIGH (ref 22–32)
pCO2 arterial: 52.7 mmHg — ABNORMAL HIGH (ref 32.0–48.0)
pH, Arterial: 7.483 — ABNORMAL HIGH (ref 7.350–7.450)
pO2, Arterial: 66 mmHg — ABNORMAL LOW (ref 83.0–108.0)

## 2019-08-01 LAB — COMPREHENSIVE METABOLIC PANEL
ALT: 59 U/L — ABNORMAL HIGH (ref 0–44)
AST: 56 U/L — ABNORMAL HIGH (ref 15–41)
Albumin: 2.9 g/dL — ABNORMAL LOW (ref 3.5–5.0)
Alkaline Phosphatase: 85 U/L (ref 38–126)
Anion gap: 18 — ABNORMAL HIGH (ref 5–15)
BUN: 77 mg/dL — ABNORMAL HIGH (ref 8–23)
CO2: 35 mmol/L — ABNORMAL HIGH (ref 22–32)
Calcium: 9.2 mg/dL (ref 8.9–10.3)
Chloride: 93 mmol/L — ABNORMAL LOW (ref 98–111)
Creatinine, Ser: 1.96 mg/dL — ABNORMAL HIGH (ref 0.61–1.24)
GFR calc Af Amer: 40 mL/min — ABNORMAL LOW (ref >60)
GFR calc non Af Amer: 35 mL/min — ABNORMAL LOW (ref >60)
Glucose, Bld: 144 mg/dL — ABNORMAL HIGH (ref 70–99)
Potassium: 4.1 mmol/L (ref 3.5–5.1)
Sodium: 146 mmol/L — ABNORMAL HIGH (ref 135–145)
Total Bilirubin: 0.7 mg/dL (ref 0.3–1.2)
Total Protein: 6.3 g/dL — ABNORMAL LOW (ref 6.5–8.1)

## 2019-08-01 LAB — URINALYSIS, ROUTINE W REFLEX MICROSCOPIC
Bilirubin Urine: NEGATIVE
Glucose, UA: NEGATIVE mg/dL
Hgb urine dipstick: NEGATIVE
Ketones, ur: NEGATIVE mg/dL
Leukocytes,Ua: NEGATIVE
Nitrite: NEGATIVE
Protein, ur: NEGATIVE mg/dL
Specific Gravity, Urine: 1.023 (ref 1.005–1.030)
pH: 5 (ref 5.0–8.0)

## 2019-08-01 LAB — GLUCOSE, CAPILLARY
Glucose-Capillary: 181 mg/dL — ABNORMAL HIGH (ref 70–99)
Glucose-Capillary: 188 mg/dL — ABNORMAL HIGH (ref 70–99)
Glucose-Capillary: 192 mg/dL — ABNORMAL HIGH (ref 70–99)
Glucose-Capillary: 252 mg/dL — ABNORMAL HIGH (ref 70–99)
Glucose-Capillary: 201 mg/dL — ABNORMAL HIGH (ref 70–99)

## 2019-08-01 LAB — CBC
HCT: 40.5 % (ref 39.0–52.0)
Hemoglobin: 12.5 g/dL — ABNORMAL LOW (ref 13.0–17.0)
MCH: 30.3 pg (ref 26.0–34.0)
MCHC: 30.9 g/dL (ref 30.0–36.0)
MCV: 98.1 fL (ref 80.0–100.0)
Platelets: 356 10*3/uL (ref 150–400)
RBC: 4.13 MIL/uL — ABNORMAL LOW (ref 4.22–5.81)
RDW: 14.8 % (ref 11.5–15.5)
WBC: 19.2 10*3/uL — ABNORMAL HIGH (ref 4.0–10.5)
nRBC: 0.5 % — ABNORMAL HIGH (ref 0.0–0.2)

## 2019-08-01 LAB — FERRITIN: Ferritin: 1038 ng/mL — ABNORMAL HIGH (ref 24–336)

## 2019-08-01 LAB — MAGNESIUM: Magnesium: 2.6 mg/dL — ABNORMAL HIGH (ref 1.7–2.4)

## 2019-08-01 LAB — C-REACTIVE PROTEIN: CRP: 1.1 mg/dL — ABNORMAL HIGH (ref <1.0)

## 2019-08-01 LAB — PROCALCITONIN: Procalcitonin: 2.2 ng/mL

## 2019-08-01 LAB — D-DIMER, QUANTITATIVE: D-Dimer, Quant: >20 ug/mL-FEU — ABNORMAL HIGH (ref 0.00–0.50)

## 2019-08-01 LAB — PHOSPHORUS: Phosphorus: 5.7 mg/dL — ABNORMAL HIGH (ref 2.5–4.6)

## 2019-08-01 MED ORDER — VANCOMYCIN HCL 10 G IV SOLR
1250.0000 mg | INTRAVENOUS | Status: DC
  Filled 2019-08-01: qty 1250

## 2019-08-01 MED ORDER — FAMOTIDINE 40 MG/5ML PO SUSR
20.0000 mg | Freq: Once | ORAL | Status: DC
  Filled 2019-08-01: qty 2.5

## 2019-08-01 MED ORDER — SODIUM CHLORIDE 0.9 % IV SOLN
2.0000 g | Freq: Two times a day (BID) | INTRAVENOUS | Status: DC
  Administered 2019-08-01 – 2019-08-02 (×3): 2 g via INTRAVENOUS
  Filled 2019-08-01 (×3): qty 2

## 2019-08-01 MED ORDER — STERILE WATER FOR INJECTION IJ SOLN
INTRAMUSCULAR | Status: AC
  Filled 2019-08-01: qty 10

## 2019-08-01 MED ORDER — VANCOMYCIN HCL 10 G IV SOLR
2000.0000 mg | Freq: Once | INTRAVENOUS | Status: AC
  Administered 2019-08-01: 2000 mg via INTRAVENOUS
  Filled 2019-08-01: qty 2000

## 2019-08-01 MED ORDER — ALBUMIN HUMAN 25 % IV SOLN
25.0000 g | Freq: Four times a day (QID) | INTRAVENOUS | Status: AC
  Administered 2019-08-01 – 2019-08-02 (×4): 25 g via INTRAVENOUS
  Filled 2019-08-01 (×2): qty 50
  Filled 2019-08-01 (×3): qty 100

## 2019-08-01 NOTE — Progress Notes (Signed)
Sputum sample sent to lab per MD order 

## 2019-08-01 NOTE — Progress Notes (Signed)
MRSA swab came back "invalid" per [facility]. Will recollect and send out again.

## 2019-08-01 NOTE — Progress Notes (Signed)
procal 2.2.  Will pan culture and send broad spectrum abx  Rush Farmer, M.D. [facility] Pulmonary/Critical Care Medicine. Pager: [phone]. After hours pager: [phone].

## 2019-08-01 NOTE — Progress Notes (Signed)
NAME:  [name], MRN:  [id number], DOB:  [birth year], LOS: 7 ADMISSION DATE:  07/25/2019, CONSULTATION DATE:  10/3 REFERRING MD:  Bridgett Larsson, CHIEF COMPLAINT:  Dyspnea   Brief History   68 y/o male admitted on 10/3 from the [facility] ED with ARDS from COVID pneumonia leading to need for mechanical ventilation.    Past Medical History  COVID 19 Heartburn  Significant Hospital Events   10/3 admission 10/6 prone ventilation Consults:  PCCM  Procedures:  10/2 ETT>  10/2 R IJ CVL >   Significant Diagnostic Tests:  10/2 CT angiogram - extensive bilateral airspace disease predominantly posterior 10/6 CXR - intubated. Patchy bilateral airspace disease.  Micro Data:  9/20 SARS COV 2 > POSITIVE 10/2 blood > negative  Antimicrobials/COVID Rx  10/3 remdesivir > 10/6 10/3 actemra 10/3 decadron > 10/4 convalescent plasma, repeat 10/6  Interim history/subjective:  No events overnight Continues to require high PEEP and FiO2  Objective   Blood pressure (!) 95/58, pulse 91, temperature 99.7 F (37.6 C), resp. rate 17, height 5\' 10"  (1.778 m), weight 92.7 kg, SpO2 96 %. CVP:  [6 mmHg] 6 mmHg  Vent Mode: PCV FiO2 (%):  [40 %-50 %] 45 % Set Rate:  [15 bmp] 15 bmp PEEP:  [10 DDU20-25 cmH20] 12 cmH20 Plateau Pressure:  [13 cmH20] 13 cmH20   Intake/Output Summary (Last 24 hours) at 08/01/2019 0831 Last data filed at 08/01/2019 0800 Gross per 24 hour  Intake 3525.85 ml  Output 4610 ml  Net -1084.15 ml   Filed Weights   07/31/19 0400 07/31/19 1400 08/01/19 0500  Weight: 92.8 kg 93.2 kg 92.7 kg   Examination:  General: Supine, acutely ill appearing male, NAD, agitated HENT: [place]/AT, PERRL, EOM-I and MMM, ETT in place PULM: CTA bilaterally CV: RRR, Nl S1/S2 and -M/R/G GI: Soft, NT, ND and +BS MSK: -edema and -tenderness Neuro: sedated on vent  I reviewed CXR myself, ETT is in a good position and infiltrate noted  Resolved Hospital Problem list     Assessment & Plan:    Critically ill due to acute hypoxemic respiratory failure requiring mechanical ventilation, prone positioning and titration of sedative infusions to maintain compliance with therapy.  D/C prone ventilation Continue mechanical ventilation per ARDS protocol Target TVol 6-8cc/kgIBW Target Plateau Pressure < 30cm H20 Target driving pressure less than 15 cm of water Target PaO2 55-65: titrate PEEP/FiO2 per protocol Continue PCV ventilation today with PEEP of 12 and Fio2 45% Titrate PEEP to 10 first if able, goal is 40% and PEEP of 10  Severe ARDS due to COVID 19 pneumonia Decadron for 10 days Completed remdesivir 10/7 Hold further diureses today given renal function Will attempt albumen today for intravascular volume expansion and avoid lasix  Anticoagulation/DVT prevention in setting of thrombophilia from COVID 19 Cannot enroll in [place] as wife has COVID and we don't have a way for her to undergo consent virtually, she doesn't feel good enough to drive in Continue lovenox 0.5mg /kg bid per protocol   Best practice:  Diet: tube feeding, start today Pain/Anxiety/Delirium protocol (if indicated): as above VAP protocol (if indicated): yes DVT prophylaxis: as above GI prophylaxis: famotidine Glucose control: SSI Mobility: bed rest Code Status: full Family Communication:Dr McQuaid called his wife [name] on 10/6 and gave her an update Disposition: remain in ICU  Labs   CBC: Recent Labs  Lab 07/26/19 0425  07/27/19 0231  07/28/19 0500  07/29/19 0450  07/30/19 0451  07/31/19 0500 07/31/19 1057 07/31/19 1517  08/01/19 0457 08/01/19 0500  WBC 14.0*  --  16.4*  --  14.8*  --  10.7*  --  9.9  --  14.1*  --   --   --  19.2*  NEUTROABS 12.9*  --  15.2*  --  13.6*  --  9.7*  --  8.6*  --   --   --   --   --   --   HGB 11.7*   < > 12.4*   < > 11.5*   < > 11.3*   < > 12.9*   < > 12.5* 13.3 13.3 13.3 12.5*  HCT 36.0*   < > 38.6*   < > 36.9*   < > 37.1*   < > 41.9   < > 41.0 39.0 39.0  39.0 40.5  MCV 93.3  --  94.8  --  97.1  --  100.3*  --  98.4  --  100.5*  --   --   --  98.1  PLT 300  --  437*  --  450*  --  415*  --  411*  --  354  --   --   --  356   < > = values in this interval not displayed.    Basic Metabolic Panel: Recent Labs  Lab 07/27/19 0231  07/28/19 0500  07/29/19 0450  07/30/19 0451  07/31/19 0500 07/31/19 1057 07/31/19 1517 08/01/19 0457 08/01/19 0500  NA 144   < > 151*   < > 152*   < > 149*   < > 147* 148* 146* 143 146*  K 4.5   < > 4.6   < > 4.9   < > 4.8   < > 4.9 4.5 4.9 4.0 4.1  CL 111  --  113*  --  115*  --  105  --  107  --   --   --  93*  CO2 24  --  29  --  28  --  31  --  31  --   --   --  35*  GLUCOSE 214*  --  157*  --  151*  --  138*  --  149*  --   --   --  144*  BUN 49*  --  56*  --  50*  --  53*  --  55*  --   --   --  77*  CREATININE 1.95*  --  1.75*  --  1.27*  --  1.43*  --  1.36*  --   --   --  1.96*  CALCIUM 8.4*  --  8.5*  --  8.6*  --  9.0  --  9.0  --   --   --  9.2  MG 3.2*  --  3.1*  --  2.9*  --  2.7*  --   --   --   --   --  2.6*  PHOS 3.4  --  3.2  --  2.8  --  5.5*  --   --   --   --   --  5.7*   < > = values in this interval not displayed.   GFR: Estimated Creatinine Clearance: 42.4 mL/min (A) (by C-G formula based on SCr of 1.96 mg/dL (H)). Recent Labs  Lab 07/25/19 1656 07/25/19 2005  07/29/19 0450 07/30/19 0451 07/31/19 0500 08/01/19 0500  PROCALCITON 3.62  --   --   --   --   --   --  WBC 10.2  --    < > 10.7* 9.9 14.1* 19.2*  LATICACIDVEN 2.2* 1.2  --   --   --   --   --    < > = values in this interval not displayed.    Liver Function Tests: Recent Labs  Lab 07/28/19 0500 07/29/19 0450 07/30/19 0451 07/31/19 0500 08/01/19 0500  AST 25 32 61* 52* 56*  ALT 27 28 48* 53* 59*  ALKPHOS 56 58 67 69 85  BILITOT 0.6 0.4 0.6 0.4 0.7  PROT 6.2* 5.8* 6.6 6.1* 6.3*  ALBUMIN 2.5* 2.5* 2.9* 2.7* 2.9*   No results for input(s): LIPASE, AMYLASE in the last 168 hours. No results for input(s):  AMMONIA in the last 168 hours.  ABG    Component Value Date/Time   PHART 7.483 (H) 08/01/2019 0457   PCO2ART 52.7 (H) 08/01/2019 0457   PO2ART 66.0 (L) 08/01/2019 0457   HCO3 39.3 (H) 08/01/2019 0457   TCO2 41 (H) 08/01/2019 0457   ACIDBASEDEF 1.0 07/27/2019 0546   O2SAT 93.0 08/01/2019 0457     Coagulation Profile: No results for input(s): INR, PROTIME in the last 168 hours.  Cardiac Enzymes: No results for input(s): CKTOTAL, CKMB, CKMBINDEX, TROPONINI in the last 168 hours.  HbA1C: Hgb A1c MFr Bld  Date/Time Value Ref Range Status  07/26/2019 05:00 AM 6.1 (H) 4.8 - 5.6 % Final    Comment:    (NOTE) Pre diabetes:          5.7%-6.4% Diabetes:              >6.4% Glycemic control for   <7.0% adults with diabetes    CBG: Recent Labs  Lab 07/31/19 1519 07/31/19 1935 07/31/19 2313 08/01/19 0335 08/01/19 0748  GLUCAP 162* 172* 170* 181* 188*   The patient is critically ill with multiple organ systems failure and requires high complexity decision making for assessment and support, frequent evaluation and titration of therapies, application of advanced monitoring technologies and extensive interpretation of multiple databases.   Critical Care Time devoted to patient care services described in this note is  32  Minutes. This time reflects time of care of this signee Dr Jennet Maduro. This critical care time does not reflect procedure time, or teaching time or supervisory time of PA/NP/Med student/Med Resident etc but could involve care discussion time.  Rush Farmer, M.D. [facility] Pulmonary/Critical Care Medicine. Pager: [phone]. After hours pager: [phone]

## 2019-08-01 NOTE — Progress Notes (Signed)
Called patient's wife [name] and gave her updates on patient's care and condition. Answered all questions she had regarding patient's care. She is very grateful to the staff at [facility] for the care he is receiving. Told her to call if she has further questions.

## 2019-08-01 NOTE — Progress Notes (Signed)
Pharmacy Antibiotic Note  [name] is a 68 y.o. male admitted on 07/25/2019 with COVID-19.  Pharmacy has been consulted for cefepime and vancomycin dosing for pneumonia. Patient is febrile with Tmax 101.5 and procalcitonin is 2.2. WBC increased from 10.2 on admission to 19.2, of note the patient is on dexamethasone. Scr increased from 1.36 (approximately at baseline) to 1.96 on 10/9 likely due to diuresis. Current CrCl is 42.4 ml/min. Will renally dose adjust cefepime and monitor need to adjust vancomycin maintenance regimen and/or get vancomycin levels.   Vancomycin 1250 mg IV Q 24 hrs. Goal AUC 400-550. Expected AUC: 517 SCr used: 1.96 Vd used: 0.72   Plan: Start cefepime 2g q12h  Give vancomycin 2000mg  x1 loading dose Start vancomycin 1250mg  q24h at 1400 on 10/10 Reassess renal function prior to starting maintenance regimen of vancomycin and obtain vancomycin levels as indicated  Monitor renal function, cultures/sensitivities, and clinical progression  Height: 5\' 10"  (177.8 cm) Weight: 204 lb 5.9 oz (92.7 kg) IBW/kg (Calculated) : 73  Temp (24hrs), Avg:99.8 F (37.7 C), Min:99 F (37.2 C), Max:101.5 F (38.6 C)  Recent Labs  Lab 07/25/19 1656 07/25/19 2005  07/28/19 0500 07/29/19 0450 07/30/19 0451 07/31/19 0500 08/01/19 0500  WBC 10.2  --    < > 14.8* 10.7* 9.9 14.1* 19.2*  CREATININE 1.51*  --    < > 1.75* 1.27* 1.43* 1.36* 1.96*  LATICACIDVEN 2.2* 1.2  --   --   --   --   --   --    < > = values in this interval not displayed.    Estimated Creatinine Clearance: 42.4 mL/min (A) (by C-G formula based on SCr of 1.96 mg/dL (H)).    No Known Allergies  Antimicrobials this admission: Remdesivir 10/3 >>10/6  Dose adjustments this admission: N/A  Microbiology results: 10/2 BCx: no growth final  10/3 MRSA PCR: negative 10/9 UCx: to be collected 10/9 BCx: to be collected  10/9 sputum: to be collected 10/9 MRSA PCR: to be collected   Thank you for allowing  pharmacy to be a part of this patient's care.  Cristela Felt, PharmD PGY1 Pharmacy Resident  08/01/2019 11:44 AM

## 2019-08-01 NOTE — Progress Notes (Signed)
PROGRESS NOTE    [name]  KGM:[id number] DOB: 05/14/1951 DOA: 07/25/2019 PCP: Kathyrn Drown, MD   Brief Narrative:  68 year old BM PMHx GERD, BPH  Presented to [facility] ER with shortness of breath and hypoxia.  Patient was diagnosed with COVID 19 on July 13 9019.  Patient's wife also has COVID-19 but is asymptomatic.  Over the last 1 to 2 days, the patient's shortness of breath is increased.  Patient has been suffering with anorexia.  Patient's wife was actually having a conversation via teleconference with the patient's PCP.  PCP is also otherwise physician.  He had actually called to check on how the wife was doing.  The wife had mentioned to the physician that the patient has been laying in bed.  When the physician listened to how the patient was speaking over the phone, it was noted the patient was tachypneic.  EMS was activated.  When EMS arrived, the patient's O2 saturations were apparently in the 40 percentile range.  Patient was placed on nonrebreather.  He was transferred to the ER.  In the ER, his oxygen saturations on 100% nonrebreather were only in the 80s.  Patient was confused.  Patient was emergently intubated.  Patient given IV steroids.  Initial chest x-ray demonstrates diffuse multifocal infiltrates.  CT scan of the chest was negative for PE.  ABG prior to leaving for [place] demonstrates a pH 7.41 PCO2 44 PO2 of 87 on FiO2 100%.  AA gradient calculates out to 571. In ED patient was emergently intubated. Central line placed. Started on IV Decadron.  Subjective: No acute issues or events overnight, patient continues to be ventilated, tolerating sedation quite well, currently awake and able to follow simple commands.  Assessment & Plan:   Principal Problem:   Pneumonia due to COVID-19 virus Active Problems:   BPH (benign prostatic hyperplasia)   Acute respiratory failure due to COVID-19 (HCC)   AKI (acute kidney injury) (HCC)   Fluid overload    COVID pneumonia/acute respiratory stress with hypoxia Rule out sepsis with concurrent bacterial pneumonia Vent Mode: PCV FiO2 (%):  [40 %-50 %] 40 % Set Rate:  [15 bmp] 15 bmp PEEP:  [10 cmH20-12 cmH20] 10 cmH20 Plateau Pressure:  [13 cmH20] 13 cmH20 - Attempting to wean vent/sedation - patient becomes quite agitated/dys-synchronous with vent when versed off - Continue Decadron - stop date 10/12 - Remdesivir Completed - 10/2 Actemra x1 dose - CRP improving appropriately - 10/3 transfused 1 unit convalescent plasma - Continue incentive spirometry, shutter valve as patient tolerates - no proning today - CT PE negative at admission; DVT BLE negative - continue to follow D-dimer - 10/6 PCXR generally improving from previous - 10/9 Eevated procal; febrile; WBC elevated to 19 - 10/9 Cultures pending; initiated broad spectrum abx per pulm/crit - cefepime/vanc Recent Labs    07/30/19 0451 07/31/19 0500 08/01/19 0500  DDIMER >20.00* >20.00* >20.00*  FERRITIN  --  1,235* 1,038*  CRP 3.0* 1.7* 1.1*    Volume overload, improving -Diuresing on hold given increase creatinine Intake/Output Summary (Last 24 hours) at 08/01/2019 1417 Last data filed at 08/01/2019 1309 Gross per 24 hour  Intake 3082.9 ml  Output 3660 ml  Net -577.1 ml    Acute kidney injury, ongoing in the setting of diuresis Recent Labs  Lab 07/28/19 0500 07/29/19 0450 07/30/19 0451 07/31/19 0500 08/01/19 0500  CREATININE 1.75* 1.27* 1.43* 1.36* 1.96*  - Trending upwards in the setting of lasix - continue to follow -  Furosemide now on hold as above  Urinary obstruction - without known history of BPH -Patient does not appear to be on any medications at home; unclear diagnosis for BPH - enlarged prostate on imaging but this is non-diagnostic -Follow closely for urinary retention - likely remove foley once extubated and ambulatory   DVT prophylaxis: Lovenox - full dose as above given elevated D-dimer Code Status:  Full Family communication: Attempted to call [name] (spouse) at 65 - no answer Disposition Plan: TBD, pending clinical improvement  Consultants:  PCCM  Procedures/Significant Events:  10/2 CTA chest - Negative for PE 10/2 PCXR - Endotracheal tube remains with tip above the thoracic inlet. Recommend repositioning and advancement. Esophagogastric tube with tip and side port below the diaphragm. Unchanged diffuse, extensive bilateral heterogeneous opacity, consistent with multifocal infection, edema, and/or ARDS. 10/3 PCXR - Slightly improved extensive bilateral heterogeneous opacifications (my read) 10/3 - Transfuse 1 unit CCP 10/6 PCXR - Generally improving aeration/diminished opacifications (R>L)  I have personally reviewed and interpreted all radiology studies and my findings are as above.  Cultures 09/29 Novel coronavirus positive 10/02 blood LEFT antecubital NGTD 10/02 blood hand NGTD 10/03 MRSA by PCR negative  Antimicrobials: Anti-infectives (From admission, onward)   Start     Stop   07/26/19 1600  remdesivir 100 mg in sodium chloride 0.9 % 250 mL IVPB     07/30/19 1559   07/26/19 0215  remdesivir 200 mg in sodium chloride 0.9 % 250 mL IVPB     07/26/19 0520     LINES / TUBES:  #7.5 ETT 10/2>> RIGHT IJ CVL>>>   Continuous Infusions: . sodium chloride Stopped (08/01/19 0957)  . albumin human Stopped (08/01/19 1149)  . ceFEPime (MAXIPIME) IV 2 g (08/01/19 1309)  . feeding supplement (VITAL 1.5 CAL) 1,000 mL (08/01/19 1409)  . fentaNYL infusion INTRAVENOUS 100 mcg/hr (08/01/19 1325)  . midazolam 2 mg/hr (08/01/19 1326)  . norepinephrine (LEVOPHED) Adult infusion Stopped (07/28/19 2137)  . [START ON 08/02/2019] vancomycin    . vancomycin     Objective: Vitals:   08/01/19 0741 08/01/19 0758 08/01/19 1108 08/01/19 1200  BP: (!) 95/58 (!) 95/58 102/66   Pulse:  91 88   Resp:  17 17   Temp:  99.7 F (37.6 C) 99.9 F (37.7 C)   TempSrc: Esophageal   Esophageal   SpO2:  96% 95%   Weight:      Height:        Intake/Output Summary (Last 24 hours) at 08/01/2019 1417 Last data filed at 08/01/2019 1309 Gross per 24 hour  Intake 3082.9 ml  Output 3660 ml  Net -577.1 ml   Filed Weights   07/31/19 0400 07/31/19 1400 08/01/19 0500  Weight: 92.8 kg 93.2 kg 92.7 kg   Physical Exam:  General: Sedated/intubated, prone, no acute distress Eyes: negative scleral hemorrhage, negative anisocoria, negative icterus ENT: Negative Runny nose, negative gingival bleeding, #7.5 ETT tube in place Neck:  Negative scars, masses, torticollis, lymphadenopathy, JVD, RIGHT IJ CVL in place Lungs: diminished bilaterally without overt wheezes or rales Cardiovascular: Bradycardic, without murmur gallop or rub normal S1 and S2 Abdomen: negative abdominal pain, nondistended, hypoactive bowel sounds, no rebound, no ascites, no appreciable mass Extremities: No significant cyanosis, clubbing Skin: Negative rashes, lesions, ulcers  Data Reviewed: Care during the described time interval was provided by me .  I have reviewed this patient's available data, including medical history, events of note, physical examination, and all test results as part of my evaluation.  CBC: Recent Labs  Lab 07/26/19 0425  07/27/19 0231  07/28/19 0500  07/29/19 0450  07/30/19 0451  07/31/19 0500 07/31/19 1057 07/31/19 1517 08/01/19 0457 08/01/19 0500  WBC 14.0*  --  16.4*  --  14.8*  --  10.7*  --  9.9  --  14.1*  --   --   --  19.2*  NEUTROABS 12.9*  --  15.2*  --  13.6*  --  9.7*  --  8.6*  --   --   --   --   --   --   HGB 11.7*   < > 12.4*   < > 11.5*   < > 11.3*   < > 12.9*   < > 12.5* 13.3 13.3 13.3 12.5*  HCT 36.0*   < > 38.6*   < > 36.9*   < > 37.1*   < > 41.9   < > 41.0 39.0 39.0 39.0 40.5  MCV 93.3  --  94.8  --  97.1  --  100.3*  --  98.4  --  100.5*  --   --   --  98.1  PLT 300  --  437*  --  450*  --  415*  --  411*  --  354  --   --   --  356   < > = values in this interval not  displayed.   Basic Metabolic Panel: Recent Labs  Lab 07/27/19 0231  07/28/19 0500  07/29/19 0450  07/30/19 0451  07/31/19 0500 07/31/19 1057 07/31/19 1517 08/01/19 0457 08/01/19 0500  NA 144   < > 151*   < > 152*   < > 149*   < > 147* 148* 146* 143 146*  K 4.5   < > 4.6   < > 4.9   < > 4.8   < > 4.9 4.5 4.9 4.0 4.1  CL 111  --  113*  --  115*  --  105  --  107  --   --   --  93*  CO2 24  --  29  --  28  --  31  --  31  --   --   --  35*  GLUCOSE 214*  --  157*  --  151*  --  138*  --  149*  --   --   --  144*  BUN 49*  --  56*  --  50*  --  53*  --  55*  --   --   --  77*  CREATININE 1.95*  --  1.75*  --  1.27*  --  1.43*  --  1.36*  --   --   --  1.96*  CALCIUM 8.4*  --  8.5*  --  8.6*  --  9.0  --  9.0  --   --   --  9.2  MG 3.2*  --  3.1*  --  2.9*  --  2.7*  --   --   --   --   --  2.6*  PHOS 3.4  --  3.2  --  2.8  --  5.5*  --   --   --   --   --  5.7*   < > = values in this interval not displayed.   GFR: Estimated Creatinine Clearance: 42.4 mL/min (A) (by C-G formula based on SCr of 1.96 mg/dL (H)).  Liver Function Tests: Recent Labs  Lab 07/28/19 0500 07/29/19 0450 07/30/19 0451 07/31/19 0500 08/01/19 0500  AST 25 32 61* 52* 56*  ALT 27 28 48* 53* 59*  ALKPHOS 56 58 67 69 85  BILITOT 0.6 0.4 0.6 0.4 0.7  PROT 6.2* 5.8* 6.6 6.1* 6.3*  ALBUMIN 2.5* 2.5* 2.9* 2.7* 2.9*   CBG: Recent Labs  Lab 07/31/19 1935 07/31/19 2313 08/01/19 0335 08/01/19 0748 08/01/19 1155  GLUCAP 172* 170* 181* 188* 192*   Urine analysis:    Component Value Date/Time   COLORURINE YELLOW 07/25/2019 1713   APPEARANCEUR HAZY (A) 07/25/2019 1713   LABSPEC 1.027 07/25/2019 1713   PHURINE 5.0 07/25/2019 1713   GLUCOSEU NEGATIVE 07/25/2019 1713   HGBUR MODERATE (A) 07/25/2019 1713   BILIRUBINUR NEGATIVE 07/25/2019 [place] 07/25/2019 1713   PROTEINUR 100 (A) 07/25/2019 1713   NITRITE NEGATIVE 07/25/2019 1713   LEUKOCYTESUR NEGATIVE 07/25/2019 1713    Recent  Results (from the past 240 hour(s))  Blood Culture (routine x 2)     Status: None   Collection Time: 07/25/19  4:56 PM   Specimen: Left Antecubital; Blood  Result Value Ref Range Status   Specimen Description LEFT ANTECUBITAL  Final   Special Requests   Final    BOTTLES DRAWN AEROBIC AND ANAEROBIC Blood Culture results may not be optimal due to an excessive volume of blood received in culture bottles   Culture   Final    NO GROWTH 5 DAYS Performed at [facility], [address], [place], [place] [address]    Report Status 07/30/2019 FINAL  Final  Blood Culture (routine x 2)     Status: None   Collection Time: 07/25/19  5:01 PM   Specimen: BLOOD RIGHT HAND  Result Value Ref Range Status   Specimen Description BLOOD RIGHT HAND  Final   Special Requests   Final    BOTTLES DRAWN AEROBIC AND ANAEROBIC Blood Culture adequate volume   Culture   Final    NO GROWTH 5 DAYS Performed at [facility], [address] [place], [place] [address]    Report Status 07/30/2019 FINAL  Final  MRSA PCR Screening     Status: None   Collection Time: 07/26/19  2:09 AM   Specimen: Nasal Mucosa; Nasopharyngeal  Result Value Ref Range Status   MRSA by PCR NEGATIVE NEGATIVE Final    Comment:        The GeneXpert MRSA Assay (FDA approved for NASAL specimens only), is one component of a comprehensive MRSA colonization surveillance program. It is not intended to diagnose MRSA infection nor to guide or monitor treatment for MRSA infections. Performed at [facility], [place] [address] [place], [place] [address]      Radiology Studies: Dg Chest Port 1 View  Result Date: 08/01/2019 CLINICAL DATA:  68 year old male with history of intubation. EXAM: PORTABLE CHEST 1 VIEW COMPARISON:  Chest x-ray 07/29/2019. FINDINGS: An endotracheal tube is in place with tip 5.0 cm above the carina. A nasogastric tube is seen extending into the stomach, however, the tip of the nasogastric tube extends  below the lower margin of the image. There is a right-sided internal jugular central venous catheter with tip terminating in the superior cavoatrial junction. Patchy multifocal interstitial and airspace disease asymmetrically distributed throughout the lungs, similar to the prior study. Slight improved aeration in the left upper lobe, with worsened aeration in the right mid to lower lung. No pleural effusions. No evidence of pulmonary edema. Heart size  is normal. Upper mediastinal contours are distorted by patient positioning. IMPRESSION: 1. Support apparatus, as above. 2. Patchy multilobar bilateral pneumonia redemonstrated, as above. Electronically Signed   By: Vinnie Langton M.D.   On: 08/01/2019 08:32   Vas [place] Lower Extremity Venous (dvt)  Result Date: 07/31/2019  Lower Venous Study Indications: Elevated d-dimer.  Comparison Study: no prior Performing Technologist: Abram Sander RVS  Examination Guidelines: A complete evaluation includes B-mode imaging, spectral Doppler, color Doppler, and power Doppler as needed of all accessible portions of each vessel. Bilateral testing is considered an integral part of a complete examination. Limited examinations for reoccurring indications may be performed as noted.  +---------+---------------+---------+-----------+----------+--------------+ RIGHT    CompressibilityPhasicitySpontaneityPropertiesThrombus Aging +---------+---------------+---------+-----------+----------+--------------+ CFV      Full           Yes      Yes                                 +---------+---------------+---------+-----------+----------+--------------+ SFJ      Full                                                        +---------+---------------+---------+-----------+----------+--------------+ FV Prox  Full                                                        +---------+---------------+---------+-----------+----------+--------------+ FV Mid   Full                                                         +---------+---------------+---------+-----------+----------+--------------+ FV DistalFull                                                        +---------+---------------+---------+-----------+----------+--------------+ PFV      Full                                                        +---------+---------------+---------+-----------+----------+--------------+ POP      Full           Yes      Yes                                 +---------+---------------+---------+-----------+----------+--------------+ PTV      Full                                                        +---------+---------------+---------+-----------+----------+--------------+  PERO     Full                                                        +---------+---------------+---------+-----------+----------+--------------+   +---------+---------------+---------+-----------+----------+--------------+ LEFT     CompressibilityPhasicitySpontaneityPropertiesThrombus Aging +---------+---------------+---------+-----------+----------+--------------+ CFV      Full           Yes      Yes                                 +---------+---------------+---------+-----------+----------+--------------+ SFJ      Full                                                        +---------+---------------+---------+-----------+----------+--------------+ FV Prox  Full                                                        +---------+---------------+---------+-----------+----------+--------------+ FV Mid   Full                                                        +---------+---------------+---------+-----------+----------+--------------+ FV DistalFull                                                        +---------+---------------+---------+-----------+----------+--------------+ PFV      Full                                                         +---------+---------------+---------+-----------+----------+--------------+ POP      Full           Yes      Yes                                 +---------+---------------+---------+-----------+----------+--------------+ PTV      Full                                                        +---------+---------------+---------+-----------+----------+--------------+ PERO     Full                                                        +---------+---------------+---------+-----------+----------+--------------+  Summary: Right: There is no evidence of deep vein thrombosis in the lower extremity. No cystic structure found in the popliteal fossa. Left: There is no evidence of deep vein thrombosis in the lower extremity. No cystic structure found in the popliteal fossa.  *See table(s) above for measurements and observations. Electronically signed by Monica Martinez MD on 07/31/2019 at 5:10:42 PM.    Final     Scheduled Meds: . sodium chloride   Intravenous Once  . chlorhexidine gluconate (MEDLINE KIT)  15 mL Mouth Rinse BID  . Chlorhexidine Gluconate Cloth  6 each Topical Daily  . dexamethasone (DECADRON) injection  6 mg Intravenous Q24H  . enoxaparin (LOVENOX) injection  90 mg Subcutaneous Q12H  . [START ON 08/02/2019] famotidine  20 mg Per Tube Once  . feeding supplement (PRO-STAT SUGAR FREE 64)  30 mL Per Tube TID  . free water  250 mL Per Tube Q4H  . insulin aspart  0-9 Units Subcutaneous Q4H  . mouth rinse  15 mL Mouth Rinse 10 times per day  . polyethylene glycol  17 g Per Tube BID  . sennosides  5 mL Per Tube BID  . sterile water (preservative free)      . tamsulosin  0.4 mg Oral BID   Continuous Infusions: . sodium chloride Stopped (08/01/19 0957)  . albumin human Stopped (08/01/19 1149)  . ceFEPime (MAXIPIME) IV 2 g (08/01/19 1309)  . feeding supplement (VITAL 1.5 CAL) 1,000 mL (08/01/19 1409)  . fentaNYL infusion INTRAVENOUS 100 mcg/hr (08/01/19 1325)  . midazolam 2  mg/hr (08/01/19 1326)  . norepinephrine (LEVOPHED) Adult infusion Stopped (07/28/19 2137)  . [START ON 08/02/2019] vancomycin    . vancomycin      LOS: 7 days   The patient is critically ill with multiple organ systems failure and requires high complexity decision making for assessment and support, frequent evaluation and titration of therapies, application of advanced monitoring technologies and extensive interpretation of multiple databases. Critical Care Time devoted to patient care services described in this note.   Time spent: 45 minutes  Little Ishikawa, DO [facility] Pager [phone]  If 7PM-7AM, please contact night-coverage [email or web address] Password [email or web address] 08/01/2019, 2:17 PM

## 2019-08-02 ENCOUNTER — Inpatient Hospital Stay (HOSPITAL_COMMUNITY): Payer: Medicare HMO

## 2019-08-02 DIAGNOSIS — R112 Nausea with vomiting, unspecified: Secondary | ICD-10-CM | POA: Diagnosis not present

## 2019-08-02 DIAGNOSIS — J9600 Acute respiratory failure, unspecified whether with hypoxia or hypercapnia: Secondary | ICD-10-CM | POA: Diagnosis not present

## 2019-08-02 DIAGNOSIS — Z9911 Dependence on respirator [ventilator] status: Secondary | ICD-10-CM

## 2019-08-02 DIAGNOSIS — U071 COVID-19: Secondary | ICD-10-CM | POA: Diagnosis not present

## 2019-08-02 DIAGNOSIS — J1289 Other viral pneumonia: Secondary | ICD-10-CM | POA: Diagnosis not present

## 2019-08-02 DIAGNOSIS — J80 Acute respiratory distress syndrome: Secondary | ICD-10-CM | POA: Diagnosis not present

## 2019-08-02 DIAGNOSIS — R7881 Bacteremia: Secondary | ICD-10-CM | POA: Diagnosis not present

## 2019-08-02 DIAGNOSIS — N179 Acute kidney failure, unspecified: Secondary | ICD-10-CM | POA: Diagnosis not present

## 2019-08-02 DIAGNOSIS — B9561 Methicillin susceptible Staphylococcus aureus infection as the cause of diseases classified elsewhere: Secondary | ICD-10-CM | POA: Diagnosis not present

## 2019-08-02 LAB — COMPREHENSIVE METABOLIC PANEL
ALT: 38 U/L (ref 0–44)
AST: 31 U/L (ref 15–41)
Albumin: 3.7 g/dL (ref 3.5–5.0)
Alkaline Phosphatase: 91 U/L (ref 38–126)
Anion gap: 11 (ref 5–15)
BUN: 74 mg/dL — ABNORMAL HIGH (ref 8–23)
CO2: 35 mmol/L — ABNORMAL HIGH (ref 22–32)
Calcium: 9.4 mg/dL (ref 8.9–10.3)
Chloride: 99 mmol/L (ref 98–111)
Creatinine, Ser: 1.54 mg/dL — ABNORMAL HIGH (ref 0.61–1.24)
GFR calc Af Amer: 54 mL/min — ABNORMAL LOW (ref >60)
GFR calc non Af Amer: 46 mL/min — ABNORMAL LOW (ref >60)
Glucose, Bld: 157 mg/dL — ABNORMAL HIGH (ref 70–99)
Potassium: 4.3 mmol/L (ref 3.5–5.1)
Sodium: 145 mmol/L (ref 135–145)
Total Bilirubin: 0.7 mg/dL (ref 0.3–1.2)
Total Protein: 6.5 g/dL (ref 6.5–8.1)

## 2019-08-02 LAB — BLOOD CULTURE ID PANEL (REFLEXED)

## 2019-08-02 LAB — POCT I-STAT 7, (LYTES, BLD GAS, ICA,H+H)
Acid-Base Excess: 12 mmol/L — ABNORMAL HIGH (ref 0.0–2.0)
Bicarbonate: 36.7 mmol/L — ABNORMAL HIGH (ref 20.0–28.0)
Calcium, Ion: 1.27 mmol/L (ref 1.15–1.40)
HCT: 32 % — ABNORMAL LOW (ref 39.0–52.0)
Hemoglobin: 10.9 g/dL — ABNORMAL LOW (ref 13.0–17.0)
O2 Saturation: 91 %
Patient temperature: 36.6
Potassium: 4.3 mmol/L (ref 3.5–5.1)
Sodium: 143 mmol/L (ref 135–145)
TCO2: 38 mmol/L — ABNORMAL HIGH (ref 22–32)
pCO2 arterial: 49.1 mmHg — ABNORMAL HIGH (ref 32.0–48.0)
pH, Arterial: 7.48 — ABNORMAL HIGH (ref 7.350–7.450)
pO2, Arterial: 56 mmHg — ABNORMAL LOW (ref 83.0–108.0)

## 2019-08-02 LAB — CBC
HCT: 34.1 % — ABNORMAL LOW (ref 39.0–52.0)
Hemoglobin: 10.5 g/dL — ABNORMAL LOW (ref 13.0–17.0)
MCH: 30.3 pg (ref 26.0–34.0)
MCHC: 30.8 g/dL (ref 30.0–36.0)
MCV: 98.3 fL (ref 80.0–100.0)
Platelets: 281 10*3/uL (ref 150–400)
RBC: 3.47 MIL/uL — ABNORMAL LOW (ref 4.22–5.81)
RDW: 14.6 % (ref 11.5–15.5)
WBC: 18.5 10*3/uL — ABNORMAL HIGH (ref 4.0–10.5)
nRBC: 0.2 % (ref 0.0–0.2)

## 2019-08-02 LAB — GLUCOSE, CAPILLARY
Glucose-Capillary: 167 mg/dL — ABNORMAL HIGH (ref 70–99)
Glucose-Capillary: 171 mg/dL — ABNORMAL HIGH (ref 70–99)
Glucose-Capillary: 177 mg/dL — ABNORMAL HIGH (ref 70–99)
Glucose-Capillary: 177 mg/dL — ABNORMAL HIGH (ref 70–99)
Glucose-Capillary: 179 mg/dL — ABNORMAL HIGH (ref 70–99)
Glucose-Capillary: 179 mg/dL — ABNORMAL HIGH (ref 70–99)
Glucose-Capillary: 185 mg/dL — ABNORMAL HIGH (ref 70–99)

## 2019-08-02 LAB — MRSA PCR SCREENING: MRSA by PCR: NEGATIVE

## 2019-08-02 LAB — FERRITIN: Ferritin: 897 ng/mL — ABNORMAL HIGH (ref 24–336)

## 2019-08-02 LAB — C-REACTIVE PROTEIN: CRP: 2.6 mg/dL — ABNORMAL HIGH (ref <1.0)

## 2019-08-02 LAB — PHOSPHORUS: Phosphorus: 3.8 mg/dL (ref 2.5–4.6)

## 2019-08-02 LAB — MAGNESIUM: Magnesium: 3.1 mg/dL — ABNORMAL HIGH (ref 1.7–2.4)

## 2019-08-02 LAB — D-DIMER, QUANTITATIVE: D-Dimer, Quant: 10.19 ug/mL-FEU — ABNORMAL HIGH (ref 0.00–0.50)

## 2019-08-02 MED ORDER — QUETIAPINE FUMARATE 25 MG PO TABS
25.0000 mg | ORAL_TABLET | Freq: Two times a day (BID) | ORAL | Status: DC
  Administered 2019-08-02 – 2019-08-14 (×22): 25 mg via ORAL
  Filled 2019-08-02 (×23): qty 1

## 2019-08-02 MED ORDER — METOLAZONE 5 MG PO TABS
5.0000 mg | ORAL_TABLET | Freq: Once | ORAL | Status: AC
  Administered 2019-08-02: 12:00:00 5 mg via ORAL
  Filled 2019-08-02: qty 1

## 2019-08-02 MED ORDER — FAMOTIDINE 40 MG/5ML PO SUSR
20.0000 mg | Freq: Two times a day (BID) | ORAL | Status: DC
  Administered 2019-08-02 (×2): 20 mg
  Filled 2019-08-02 (×2): qty 2.5

## 2019-08-02 MED ORDER — CEFAZOLIN SODIUM-DEXTROSE 2-4 GM/100ML-% IV SOLN
2.0000 g | Freq: Three times a day (TID) | INTRAVENOUS | Status: DC
  Administered 2019-08-02 – 2019-08-06 (×11): 2 g via INTRAVENOUS
  Filled 2019-08-02 (×12): qty 100

## 2019-08-02 MED ORDER — OXYCODONE HCL 5 MG PO TABS
5.0000 mg | ORAL_TABLET | Freq: Four times a day (QID) | ORAL | Status: DC
  Administered 2019-08-03 – 2019-08-06 (×8): 5 mg via ORAL
  Filled 2019-08-02 (×8): qty 1

## 2019-08-02 MED ORDER — FUROSEMIDE 10 MG/ML IJ SOLN
40.0000 mg | Freq: Four times a day (QID) | INTRAMUSCULAR | Status: AC
  Administered 2019-08-02 (×2): 40 mg via INTRAVENOUS
  Filled 2019-08-02 (×2): qty 4

## 2019-08-02 MED ORDER — OXYCODONE HCL 20 MG/ML PO CONC
5.0000 mg | Freq: Four times a day (QID) | ORAL | Status: DC
  Administered 2019-08-02 (×2): 5 mg via ORAL
  Filled 2019-08-02 (×2): qty 1

## 2019-08-02 NOTE — Progress Notes (Signed)
Spoke with patient's wife, [name] and gave her updates on patient. Answered her questions regarding patient care. Told her to call if she has further questions.

## 2019-08-02 NOTE — Progress Notes (Signed)
Pharmacy Antibiotic Note  [name] is a 68 y.o. male admitted on 07/25/2019 with COVID-19.  On 10/9 he developed fever, elevated PCT 2.2, WBC increased to 19.2 (also on steroids).  SCr is acutely elevated (1.3 > 1.96) related to diuresis.  Vancomycin and Cefepime were started for suspected pneumonia. Now blood and tracheal aspirate cultures are growing GPC with BCID = MSSA.  Lines are being removed.  Pharmacy is consulted to narrow antibiotics to Cefazolin.   Plan: Start cefazolin 2g IV q8h Monitor renal function, cultures/sensitivities, and clinical progression  Height: 5\' 10"  (177.8 cm) Weight: 204 lb 9.4 oz (92.8 kg) IBW/kg (Calculated) : 73  Temp (24hrs), Avg:98.7 F (37.1 C), Min:97.9 F (36.6 C), Max:99 F (37.2 C)  Recent Labs  Lab 07/29/19 0450 07/30/19 0451 07/31/19 0500 08/01/19 0500 08/02/19 0457  WBC 10.7* 9.9 14.1* 19.2* 18.5*  CREATININE 1.27* 1.43* 1.36* 1.96* 1.54*    Estimated Creatinine Clearance: 54 mL/min (A) (by C-G formula based on SCr of 1.54 mg/dL (H)).    No Known Allergies  Antimicrobials this admission: 10/3 Remdesivir >>10/6 10/9 Vancomycin >> 10/10 10/9 Cefepime >> 10/10 10/10 Cefazolin >>   Dose adjustments this admission: N/A  Microbiology results: 10/2 BCx: no growth final  10/3 MRSA PCR: negative 10/9 UCx: collected. 10/9 BCx: 4/4 bottles GPC clusters.  BCID = MSSA 10/9 tracheal aspirate: abundant GPC clusters 10/9 MRSA PCR: negative  Thank you for allowing pharmacy to be a part of this patient's care.  Gretta Arab PharmD, BCPS Clinical pharmacist phone 7am- 5pm: [phone] 08/02/2019 1:31 PM

## 2019-08-02 NOTE — Progress Notes (Signed)
NAME:  [name], MRN:  [id number], DOB:  July 04, 1951, LOS: 8 ADMISSION DATE:  07/25/2019, CONSULTATION DATE:  10/3 REFERRING MD:  Bridgett Larsson, CHIEF COMPLAINT:  Dyspnea   Brief History   68 y/o male admitted on 10/3 from the [facility] ED with ARDS from COVID pneumonia leading to need for mechanical ventilation.    Past Medical History  COVID 19 Heartburn  Significant Hospital Events   10/3 admission  Consults:  PCCM  Procedures:  10/2 ETT>  10/2 R IJ CVL >   Significant Diagnostic Tests:  10/2 CT angiogram > personally reviewed, extensive bilateral airspace disease predominantly posterior  Micro Data:  9/20 SARS COV 2 > POSITIVE 10/2 blood >  10/9 blood > MSSA 2/4 10/9 urine >  10/9 resp >   Antimicrobials/COVID Rx  10/3 remdesivir > 10/6 10/3 actemra 10/3 decadron > 10/4 convalescent plasma, repeat 10/6  10/9 vanc >  10/9 cefepime >   Interim history/subjective:   Diuresed aggressively MSSA in blood culture   Objective   Blood pressure (!) 145/71, pulse 85, temperature 98.2 F (36.8 C), resp. rate 19, height 5\' 10"  (1.778 m), weight 92.8 kg, SpO2 91 %.    Vent Mode: PCV FiO2 (%):  [40 %-45 %] 40 % Set Rate:  [15 bmp] 15 bmp PEEP:  [10 [id number] cmH20] 10 cmH20 Plateau Pressure:  [20 cmH20-23 cmH20] 23 cmH20   Intake/Output Summary (Last 24 hours) at 08/02/2019 0728 Last data filed at 08/02/2019 0500 Gross per 24 hour  Intake 1789.11 ml  Output 1850 ml  Net -60.89 ml   Filed Weights   07/31/19 1400 08/01/19 0500 08/02/19 0500  Weight: 93.2 kg 92.7 kg 92.8 kg    Examination:  General:  In bed on vent HENT: NCAT ETT in place PULM: rhonchi bilaterally, some wheezing, vent supported breathing CV: RRR, no mgr GI: BS+, soft, nontender MSK: normal bulk and tone Neuro: sedated on vent  10/9 CXR > pulmonary parenchyma appears improved, ETT in place, persistent consolidation in RLL  Resolved Hospital Problem list     Assessment & Plan:  Severe ARDS  due to COVID 19 pneumonia Continue decadron 10 days Continue mechanical ventilation per ARDS protocol Target TVol 6-8cc/kgIBW Target Plateau Pressure < 30cm H20 Target driving pressure less than 15 cm of water Target PaO2 55-65: titrate PEEP/FiO2 per protocol As long as PaO2 to FiO2 ratio is less than 1:150 position in prone position for 16 hours a day Check CVP daily if CVL in place Target CVP less than 4, diurese as necessary Ventilator associated pneumonia prevention protocol 10/10 weaning ventilator some on pressure support, wean sedation, repeat lasix again today with one dose of metolazone  Anticoagulation/DVT prevention in setting of thrombophilia from COVID 19 Continue lovenox 0.5mg /kg bid  MSSA bacteremia: Remove CVL Adjust antibiotics today  Need for sedation/mechanical ventilation: severe agitation when weaning sedation RASS target -1 to -2 Add oxycodone, seroquel Continue fentanyl/versed infusions, wean off  Brown/bilious secretions from gastric tube Portable abdomen film OG to suction  Best practice:  Diet: tube feeding Pain/Anxiety/Delirium protocol (if indicated): as above VAP protocol (if indicated): yes DVT prophylaxis: as above GI prophylaxis: famotidine Glucose control: SSI Mobility: bed rest Code Status: full Family Communication: will clarify with TRH who will call today Disposition: remain in ICU  Labs   CBC: Recent Labs  Lab 07/27/19 0231  07/28/19 0500  07/29/19 0450  07/30/19 0451  07/31/19 0500  07/31/19 1517 08/01/19 0457 08/01/19 0500 08/02/19 0406 08/02/19 0457  WBC 16.4*  --  14.8*  --  10.7*  --  9.9  --  14.1*  --   --   --  19.2*  --  18.5*  NEUTROABS 15.2*  --  13.6*  --  9.7*  --  8.6*  --   --   --   --   --   --   --   --   HGB 12.4*   < > 11.5*   < > 11.3*   < > 12.9*   < > 12.5*   < > 13.3 13.3 12.5* 10.9* 10.5*  HCT 38.6*   < > 36.9*   < > 37.1*   < > 41.9   < > 41.0   < > 39.0 39.0 40.5 32.0* 34.1*  MCV 94.8  --  97.1   --  100.3*  --  98.4  --  100.5*  --   --   --  98.1  --  98.3  PLT 437*  --  450*  --  415*  --  411*  --  354  --   --   --  356  --  281   < > = values in this interval not displayed.    Basic Metabolic Panel: Recent Labs  Lab 07/28/19 0500  07/29/19 0450  07/30/19 0451  07/31/19 0500  07/31/19 1517 08/01/19 0457 08/01/19 0500 08/02/19 0406 08/02/19 0457  NA 151*   < > 152*   < > 149*   < > 147*   < > 146* 143 146* 143 145  K 4.6   < > 4.9   < > 4.8   < > 4.9   < > 4.9 4.0 4.1 4.3 4.3  CL 113*  --  115*  --  105  --  107  --   --   --  93*  --  99  CO2 29  --  28  --  31  --  31  --   --   --  35*  --  35*  GLUCOSE 157*  --  151*  --  138*  --  149*  --   --   --  144*  --  157*  BUN 56*  --  50*  --  53*  --  55*  --   --   --  77*  --  74*  CREATININE 1.75*  --  1.27*  --  1.43*  --  1.36*  --   --   --  1.96*  --  1.54*  CALCIUM 8.5*  --  8.6*  --  9.0  --  9.0  --   --   --  9.2  --  9.4  MG 3.1*  --  2.9*  --  2.7*  --   --   --   --   --  2.6*  --  3.1*  PHOS 3.2  --  2.8  --  5.5*  --   --   --   --   --  5.7*  --  3.8   < > = values in this interval not displayed.   GFR: Estimated Creatinine Clearance: 54 mL/min (A) (by C-G formula based on SCr of 1.54 mg/dL (H)). Recent Labs  Lab 07/30/19 0451 07/31/19 0500 08/01/19 0500 08/01/19 0943 08/02/19 0457  PROCALCITON  --   --   --  2.20  --   WBC 9.9 14.1* 19.2*  --  18.5*    Liver Function Tests: Recent Labs  Lab 07/29/19 0450 07/30/19 0451 07/31/19 0500 08/01/19 0500 08/02/19 0457  AST 32 61* 52* 56* 31  ALT 28 48* 53* 59* 38  ALKPHOS 58 67 69 85 91  BILITOT 0.4 0.6 0.4 0.7 0.7  PROT 5.8* 6.6 6.1* 6.3* 6.5  ALBUMIN 2.5* 2.9* 2.7* 2.9* 3.7   No results for input(s): LIPASE, AMYLASE in the last 168 hours. No results for input(s): AMMONIA in the last 168 hours.  ABG    Component Value Date/Time   PHART 7.480 (H) 08/02/2019 0406   PCO2ART 49.1 (H) 08/02/2019 0406   PO2ART 56.0 (L) 08/02/2019 0406    HCO3 36.7 (H) 08/02/2019 0406   TCO2 38 (H) 08/02/2019 0406   ACIDBASEDEF 1.0 07/27/2019 0546   O2SAT 91.0 08/02/2019 0406     Coagulation Profile: No results for input(s): INR, PROTIME in the last 168 hours.  Cardiac Enzymes: No results for input(s): CKTOTAL, CKMB, CKMBINDEX, TROPONINI in the last 168 hours.  HbA1C: Hgb A1c MFr Bld  Date/Time Value Ref Range Status  07/26/2019 05:00 AM 6.1 (H) 4.8 - 5.6 % Final    Comment:    (NOTE) Pre diabetes:          5.7%-6.4% Diabetes:              >6.4% Glycemic control for   <7.0% adults with diabetes     CBG: Recent Labs  Lab 08/01/19 1155 08/01/19 1625 08/01/19 2008 08/02/19 0017 08/02/19 0317  GLUCAP 192* 252* 201* 177* 167*     Critical care time: 35 minutes     Roselie Awkward, MD [place] [facility] Pager: [phone] Cell: [phone] If no response, call [phone]

## 2019-08-02 NOTE — Progress Notes (Signed)
PHARMACY - PHYSICIAN COMMUNICATION CRITICAL VALUE ALERT - BLOOD CULTURE IDENTIFICATION (BCID)  [name] is an 68 y.o. male who presented to [name] on 07/25/2019 .  Assessment:  20 yoM admitted on 10/2 with COVID-19 pneumonia.  Due to fever and elevated PCT 2.2 on 10/9 he was started on broad spectrum Vanc and Cefepime for possible pneumonia.  Initial MRSA PCR was negative.  Repeat MRSA PCR is pending. BCx 4/4 bottle: GPC.  BCID: staph aureus, no methicillin resistance. Tracheal aspirate: abundant GPC clusters  Name of physician (or Provider) Contacted: Dr Avon Gully  Current antibiotics: Vancomycin and Cefepime  Changes to prescribed antibiotics recommended:  Recommendations declined by provider - infection may be polymicrobial  Continue Vanc until MRSA PCR has resulted.  Results for orders placed or performed during the hospital encounter of 07/25/19  Blood Culture ID Panel (Reflexed) (Collected: 08/01/2019 12:15 PM)  Result Value Ref Range   Enterococcus species NOT DETECTED NOT DETECTED   Listeria monocytogenes NOT DETECTED NOT DETECTED   Staphylococcus species DETECTED (A) NOT DETECTED   Staphylococcus aureus (BCID) DETECTED (A) NOT DETECTED   Methicillin resistance NOT DETECTED NOT DETECTED   Streptococcus species NOT DETECTED NOT DETECTED   Streptococcus agalactiae NOT DETECTED NOT DETECTED   Streptococcus pneumoniae NOT DETECTED NOT DETECTED   Streptococcus pyogenes NOT DETECTED NOT DETECTED   Acinetobacter baumannii NOT DETECTED NOT DETECTED   Enterobacteriaceae species NOT DETECTED NOT DETECTED   Enterobacter cloacae complex NOT DETECTED NOT DETECTED   Escherichia coli NOT DETECTED NOT DETECTED   Klebsiella oxytoca NOT DETECTED NOT DETECTED   Klebsiella pneumoniae NOT DETECTED NOT DETECTED   Proteus species NOT DETECTED NOT DETECTED   Serratia marcescens NOT DETECTED NOT DETECTED   Haemophilus influenzae NOT DETECTED NOT DETECTED   Neisseria meningitidis NOT  DETECTED NOT DETECTED   Pseudomonas aeruginosa NOT DETECTED NOT DETECTED   Candida albicans NOT DETECTED NOT DETECTED   Candida glabrata NOT DETECTED NOT DETECTED   Candida krusei NOT DETECTED NOT DETECTED   Candida parapsilosis NOT DETECTED NOT DETECTED   Candida tropicalis NOT DETECTED NOT DETECTED   Gretta Arab PharmD, BCPS Clinical pharmacist phone 7am- 5pm: [phone] 08/02/2019  7:45 AM

## 2019-08-02 NOTE — Progress Notes (Addendum)
PROGRESS NOTE    [name]  SWF:[id number] DOB: June 16, 1951 DOA: 07/25/2019 PCP: Kathyrn Drown, MD   Brief Narrative:  68 year old BM PMHx GERD, BPH  Presented to [facility] ER with shortness of breath and hypoxia.  Patient was diagnosed with COVID 19 on July 13 9019.  Patient's wife also has COVID-19 but is asymptomatic.  Over the last 1 to 2 days, the patient's shortness of breath is increased.  Patient has been suffering with anorexia.  Patient's wife was actually having a conversation via teleconference with the patient's PCP.  PCP is also otherwise physician.  He had actually called to check on how the wife was doing.  The wife had mentioned to the physician that the patient has been laying in bed.  When the physician listened to how the patient was speaking over the phone, it was noted the patient was tachypneic.  EMS was activated.  When EMS arrived, the patient's O2 saturations were apparently in the 40 percentile range.  Patient was placed on nonrebreather.  He was transferred to the ER.  In the ER, his oxygen saturations on 100% nonrebreather were only in the 80s.  Patient was confused.  Patient was emergently intubated.  Patient given IV steroids.  Initial chest x-ray demonstrates diffuse multifocal infiltrates.  CT scan of the chest was negative for PE.  ABG prior to leaving for [place] demonstrates a pH 7.41 PCO2 44 PO2 of 87 on FiO2 100%.  AA gradient calculates out to 571. In ED patient was emergently intubated. Central line placed. Started on IV Decadron.  Subjective: Patient noted to be bacteremic overnight micro, patient continues to be intubated on a ventilator, tolerating sedation quite well.  Unable to further obtain review of systems  Assessment & Plan:   Principal Problem:   Pneumonia due to COVID-19 virus Active Problems:   BPH (benign prostatic hyperplasia)   Acute respiratory failure due to COVID-19 ([place])   AKI (acute kidney injury) (HCC)   Fluid  overload    COVID pneumonia/acute respiratory stress with hypoxia Sepsis secondary to MSSA bacteremia Vent Mode: PCV FiO2 (%):  [40 %-45 %] 40 % Set Rate:  [15 bmp] 15 bmp PEEP:  [10 cmH20-12 cmH20] 10 cmH20 Plateau Pressure:  [20 cmH20-23 cmH20] 23 cmH20 - Attempting to wean vent/sedation daily - Continue Decadron - stop date 10/12 - Remdesivir Completed - 10/2 Actemra x1 dose - CRP improving appropriately - 10/3 transfused 1 unit convalescent plasma - Continue incentive spirometry, shutter valve as patient tolerates - no proning today - CT PE negative at admission; DVT BLE negative - d-dimer downtrending appropriately - 10/6 PCXR generally improving from previous - 10/10 PCXR poor inspiratory effort but appears generally the same diffuse airspace disease - 10/9 Elevated procal; febrile; WBC elevated to 19 - 10/10 MSSA bacteremia noted on labs, discontinue vancomycin/cefepime, initiate cefazolin per ID Recent Labs    07/31/19 0500 08/01/19 0500 08/02/19 0457  DDIMER >20.00* >20.00* 10.19*  FERRITIN 1,235* 1,038* 897*  CRP 1.7* 1.1* 2.6*    Questionable ileus, obstruction ruled out -Patient not tolerating tube feeds at normal rate today -Pause tube feeds -Flat plate imaging of abdomen unremarkable   Volume overload, improving -Diuresis on hold given increased creatinine - now improving Intake/Output Summary (Last 24 hours) at 08/02/2019 0737 Last data filed at 08/02/2019 0500 Gross per 24 hour  Intake 1789.11 ml  Output 1850 ml  Net -60.89 ml    Acute kidney injury, in the setting of diuresis,  improving - Trending upwards in the setting of lasix - continue to follow - Furosemide now on hold as above Recent Labs  Lab 07/29/19 0450 07/30/19 0451 07/31/19 0500 08/01/19 0500 08/02/19 0457  CREATININE 1.27* 1.43* 1.36* 1.96* 1.54*    Questionable history of BPH -Patient does not appear to be on any medications at home; unclear diagnosis for BPH -Follow closely  for urinary retention - likely remove foley once extubated and ambulatory   DVT prophylaxis: Lovenox - full dose as above given elevated D-dimer Code Status: Full Family communication: Called and discussed case with [name] (spouse)  Disposition Plan: TBD, pending clinical improvement  Consultants:  PCCM  Procedures/Significant Events:  10/2 CTA chest - Negative for PE 10/2 PCXR - Endotracheal tube remains with tip above the thoracic inlet. Recommend repositioning and advancement. Esophagogastric tube with tip and side port below the diaphragm. Unchanged diffuse, extensive bilateral heterogeneous opacity, consistent with multifocal infection, edema, and/or ARDS. 10/3 PCXR - Slightly improved extensive bilateral heterogeneous opacifications (my read) 10/3 - Transfuse 1 unit CCP 10/6 PCXR - Generally improving aeration/diminished opacifications (R>L)  I have personally reviewed and interpreted all radiology studies and my findings are as above.  Cultures 09/29 Novel coronavirus positive 10/02 blood LEFT antecubital NGTD 10/02 blood hand NGTD 10/03 MRSA by PCR negative  Antimicrobials: Anti-infectives (From admission, onward)   Start     Stop   07/26/19 1600  remdesivir 100 mg in sodium chloride 0.9 % 250 mL IVPB     07/30/19 1559   07/26/19 0215  remdesivir 200 mg in sodium chloride 0.9 % 250 mL IVPB     07/26/19 0520     LINES / TUBES:  #7.5 ETT 10/2>> RIGHT IJ CVL>>>   Continuous Infusions: . sodium chloride Stopped (08/01/19 0957)  . ceFEPime (MAXIPIME) IV 2 g (08/01/19 2112)  . feeding supplement (VITAL 1.5 CAL) 55 mL/hr at 07/31/19 1800  . fentaNYL infusion INTRAVENOUS 100 mcg/hr (08/01/19 1800)  . midazolam 2 mg/hr (08/01/19 1800)  . norepinephrine (LEVOPHED) Adult infusion Stopped (07/28/19 2137)  . vancomycin     Objective: Vitals:   08/02/19 0400 08/02/19 0406 08/02/19 0500 08/02/19 0600  BP: 135/71 135/71 (!) 152/81 (!) 145/71  Pulse: 84 89 87 85  Resp: 18  (!) 29 (!) 24 19  Temp: 97.9 F (36.6 C)  98.8 F (37.1 C) 98.2 F (36.8 C)  TempSrc:      SpO2: 95%  91% 91%  Weight:   92.8 kg   Height:        Intake/Output Summary (Last 24 hours) at 08/02/2019 0737 Last data filed at 08/02/2019 0500 Gross per 24 hour  Intake 1789.11 ml  Output 1850 ml  Net -60.89 ml   Filed Weights   07/31/19 1400 08/01/19 0500 08/02/19 0500  Weight: 93.2 kg 92.7 kg 92.8 kg   Physical Exam:  General: Sedated/intubated, prone, no acute distress Eyes: negative scleral hemorrhage, negative anisocoria, negative icterus ENT: Negative Runny nose, negative gingival bleeding, #7.5 ETT tube in place Neck:  Negative scars, masses, torticollis, lymphadenopathy, JVD, RIGHT IJ CVL in place Lungs: diminished bilaterally without overt wheezes or rales Cardiovascular: Bradycardic, without murmur gallop or rub normal S1 and S2 Abdomen: negative abdominal pain, nondistended, hypoactive bowel sounds, no rebound, no ascites, no appreciable mass Extremities: No significant cyanosis, clubbing Skin: Negative rashes, lesions, ulcers  Data Reviewed: Care during the described time interval was provided by me .  I have reviewed this patient's available data, including medical history,  events of note, physical examination, and all test results as part of my evaluation.  CBC: Recent Labs  Lab 07/27/19 0231  07/28/19 0500  07/29/19 0450  07/30/19 0451  07/31/19 0500  07/31/19 1517 08/01/19 0457 08/01/19 0500 08/02/19 0406 08/02/19 0457  WBC 16.4*  --  14.8*  --  10.7*  --  9.9  --  14.1*  --   --   --  19.2*  --  18.5*  NEUTROABS 15.2*  --  13.6*  --  9.7*  --  8.6*  --   --   --   --   --   --   --   --   HGB 12.4*   < > 11.5*   < > 11.3*   < > 12.9*   < > 12.5*   < > 13.3 13.3 12.5* 10.9* 10.5*  HCT 38.6*   < > 36.9*   < > 37.1*   < > 41.9   < > 41.0   < > 39.0 39.0 40.5 32.0* 34.1*  MCV 94.8  --  97.1  --  100.3*  --  98.4  --  100.5*  --   --   --  98.1  --  98.3   PLT 437*  --  450*  --  415*  --  411*  --  354  --   --   --  356  --  281   < > = values in this interval not displayed.   Basic Metabolic Panel: Recent Labs  Lab 07/28/19 0500  07/29/19 0450  07/30/19 0451  07/31/19 0500  07/31/19 1517 08/01/19 0457 08/01/19 0500 08/02/19 0406 08/02/19 0457  NA 151*   < > 152*   < > 149*   < > 147*   < > 146* 143 146* 143 145  K 4.6   < > 4.9   < > 4.8   < > 4.9   < > 4.9 4.0 4.1 4.3 4.3  CL 113*  --  115*  --  105  --  107  --   --   --  93*  --  99  CO2 29  --  28  --  31  --  31  --   --   --  35*  --  35*  GLUCOSE 157*  --  151*  --  138*  --  149*  --   --   --  144*  --  157*  BUN 56*  --  50*  --  53*  --  55*  --   --   --  77*  --  74*  CREATININE 1.75*  --  1.27*  --  1.43*  --  1.36*  --   --   --  1.96*  --  1.54*  CALCIUM 8.5*  --  8.6*  --  9.0  --  9.0  --   --   --  9.2  --  9.4  MG 3.1*  --  2.9*  --  2.7*  --   --   --   --   --  2.6*  --  3.1*  PHOS 3.2  --  2.8  --  5.5*  --   --   --   --   --  5.7*  --  3.8   < > = values in this interval not displayed.   GFR: Estimated Creatinine Clearance: 54 mL/min (A) (  by C-G formula based on SCr of 1.54 mg/dL (H)).  Liver Function Tests: Recent Labs  Lab 07/29/19 0450 07/30/19 0451 07/31/19 0500 08/01/19 0500 08/02/19 0457  AST 32 61* 52* 56* 31  ALT 28 48* 53* 59* 38  ALKPHOS 58 67 69 85 91  BILITOT 0.4 0.6 0.4 0.7 0.7  PROT 5.8* 6.6 6.1* 6.3* 6.5  ALBUMIN 2.5* 2.9* 2.7* 2.9* 3.7   CBG: Recent Labs  Lab 08/01/19 1155 08/01/19 1625 08/01/19 2008 08/02/19 0017 08/02/19 0317  GLUCAP 192* 252* 201* 177* 167*   Urine analysis:    Component Value Date/Time   COLORURINE YELLOW 08/01/2019 1745   APPEARANCEUR HAZY (A) 08/01/2019 1745   LABSPEC 1.023 08/01/2019 1745   PHURINE 5.0 08/01/2019 1745   GLUCOSEU NEGATIVE 08/01/2019 1745   HGBUR NEGATIVE 08/01/2019 1745   BILIRUBINUR NEGATIVE 08/01/2019 1745   KETONESUR NEGATIVE 08/01/2019 1745   PROTEINUR NEGATIVE  08/01/2019 1745   NITRITE NEGATIVE 08/01/2019 1745   LEUKOCYTESUR NEGATIVE 08/01/2019 1745    Recent Results (from the past 240 hour(s))  Blood Culture (routine x 2)     Status: None   Collection Time: 07/25/19  4:56 PM   Specimen: Left Antecubital; Blood  Result Value Ref Range Status   Specimen Description LEFT ANTECUBITAL  Final   Special Requests   Final    BOTTLES DRAWN AEROBIC AND ANAEROBIC Blood Culture results may not be optimal due to an excessive volume of blood received in culture bottles   Culture   Final    NO GROWTH 5 DAYS Performed at [facility], [address], [place], [place] [address]    Report Status 07/30/2019 FINAL  Final  Blood Culture (routine x 2)     Status: None   Collection Time: 07/25/19  5:01 PM   Specimen: BLOOD RIGHT HAND  Result Value Ref Range Status   Specimen Description BLOOD RIGHT HAND  Final   Special Requests   Final    BOTTLES DRAWN AEROBIC AND ANAEROBIC Blood Culture adequate volume   Culture   Final    NO GROWTH 5 DAYS Performed at [facility], [address], [place], [place] [address]    Report Status 07/30/2019 FINAL  Final  MRSA PCR Screening     Status: None   Collection Time: 07/26/19  2:09 AM   Specimen: Nasal Mucosa; Nasopharyngeal  Result Value Ref Range Status   MRSA by PCR NEGATIVE NEGATIVE Final    Comment:        The GeneXpert MRSA Assay (FDA approved for NASAL specimens only), is one component of a comprehensive MRSA colonization surveillance program. It is not intended to diagnose MRSA infection nor to guide or monitor treatment for MRSA infections. Performed at [facility], [place] [address], [place], [place] [address]   Culture, blood (Routine X 2) w Reflex to ID Panel     Status: None (Preliminary result)   Collection Time: 08/01/19 12:15 PM   Specimen: BLOOD  Result Value Ref Range Status   Specimen Description   Final    BLOOD RIGHT ARM Performed at [facility] [address], [place], [place] [address]    Special Requests   Final    BOTTLES DRAWN AEROBIC AND ANAEROBIC Blood Culture adequate volume Performed at [facility] [address] [place], [place] [address]    Culture  Setup Time   Final    GRAM POSITIVE COCCI IN CLUSTERS IN BOTH AEROBIC AND ANAEROBIC  BOTTLES Organism ID to follow CRITICAL RESULT CALLED TO, READ BACK BY AND VERIFIED WITH: C. SHADE PHARMD, AT 5003 08/02/19 BY Rush Landmark Performed at [facility], [place] [address] [place], [place] [address]    Culture GRAM POSITIVE COCCI  Final   Report Status PENDING  Incomplete  Blood Culture ID Panel (Reflexed)     Status: Abnormal   Collection Time: 08/01/19 12:15 PM  Result Value Ref Range Status   Enterococcus species NOT DETECTED NOT DETECTED Final   Listeria monocytogenes NOT DETECTED NOT DETECTED Final   Staphylococcus species DETECTED (A) NOT DETECTED Final    Comment: CRITICAL RESULT CALLED TO, READ BACK BY AND VERIFIED WITH: C. SHADE PHARMD, AT 8916 08/02/19 BY D. VANHOOK    Staphylococcus aureus (BCID) DETECTED (A) NOT DETECTED Final    Comment: Methicillin (oxacillin) susceptible Staphylococcus aureus (MSSA). Preferred therapy is anti staphylococcal beta lactam antibiotic (Cefazolin or Nafcillin), unless clinically contraindicated. CRITICAL RESULT CALLED TO, READ BACK BY AND VERIFIED WITH: C. SHADE PHARMD, AT 9450 08/02/19 BY D. VANHOOK    Methicillin resistance NOT DETECTED NOT DETECTED Final   Streptococcus species NOT DETECTED NOT DETECTED Final   Streptococcus agalactiae NOT DETECTED NOT DETECTED Final   Streptococcus pneumoniae NOT DETECTED NOT DETECTED Final   Streptococcus pyogenes NOT DETECTED NOT DETECTED Final   Acinetobacter baumannii NOT DETECTED NOT DETECTED Final   Enterobacteriaceae species NOT DETECTED NOT DETECTED Final   Enterobacter cloacae complex NOT DETECTED NOT DETECTED Final   Escherichia coli NOT DETECTED NOT  DETECTED Final   Klebsiella oxytoca NOT DETECTED NOT DETECTED Final   Klebsiella pneumoniae NOT DETECTED NOT DETECTED Final   Proteus species NOT DETECTED NOT DETECTED Final   Serratia marcescens NOT DETECTED NOT DETECTED Final   Haemophilus influenzae NOT DETECTED NOT DETECTED Final   Neisseria meningitidis NOT DETECTED NOT DETECTED Final   Pseudomonas aeruginosa NOT DETECTED NOT DETECTED Final   Candida albicans NOT DETECTED NOT DETECTED Final   Candida glabrata NOT DETECTED NOT DETECTED Final   Candida krusei NOT DETECTED NOT DETECTED Final   Candida parapsilosis NOT DETECTED NOT DETECTED Final   Candida tropicalis NOT DETECTED NOT DETECTED Final    Comment: Performed at [facility], [place] [address] [place], [place] [address]  Culture, blood (Routine X 2) w Reflex to ID Panel     Status: None (Preliminary result)   Collection Time: 08/01/19 12:21 PM   Specimen: BLOOD  Result Value Ref Range Status   Specimen Description   Final    BLOOD RIGHT HAND Performed at [facility] [address], [place], [place] [address]    Special Requests   Final    BOTTLES DRAWN AEROBIC AND ANAEROBIC Blood Culture adequate volume Performed at [facility] [address], [place], [place] [address]    Culture  Setup Time   Final    GRAM POSITIVE COCCI IN CLUSTERS IN BOTH AEROBIC AND ANAEROBIC BOTTLES Performed at [facility], [place] [address], [place], [place] [address]    Culture GRAM POSITIVE COCCI  Final   Report Status PENDING  Incomplete  Culture, respiratory     Status: None (Preliminary result)   Collection Time: 08/01/19  3:30 PM   Specimen: Tracheal Aspirate  Result Value Ref Range Status   Specimen Description   Final    TRACHEAL ASPIRATE Performed at [facility] [address] [place],  [address]    Special Requests  Final    NONE Performed at [facility], [place] [address],  [place], [place] [address]    Gram Stain   Final    RARE WBC PRESENT, PREDOMINANTLY PMN ABUNDANT GRAM POSITIVE COCCI IN CLUSTERS Performed at [facility], [place] [address] [place], [place] [address]    Culture PENDING  Incomplete   Report Status PENDING  Incomplete     Radiology Studies: Dg Chest Port 1 View  Result Date: 08/01/2019 CLINICAL DATA:  68 year old male with history of intubation. EXAM: PORTABLE CHEST 1 VIEW COMPARISON:  Chest x-ray 07/29/2019. FINDINGS: An endotracheal tube is in place with tip 5.0 cm above the carina. A nasogastric tube is seen extending into the stomach, however, the tip of the nasogastric tube extends below the lower margin of the image. There is a right-sided internal jugular central venous catheter with tip terminating in the superior cavoatrial junction. Patchy multifocal interstitial and airspace disease asymmetrically distributed throughout the lungs, similar to the prior study. Slight improved aeration in the left upper lobe, with worsened aeration in the right mid to lower lung. No pleural effusions. No evidence of pulmonary edema. Heart size is normal. Upper mediastinal contours are distorted by patient positioning. IMPRESSION: 1. Support apparatus, as above. 2. Patchy multilobar bilateral pneumonia redemonstrated, as above. Electronically Signed   By: Vinnie Langton M.D.   On: 08/01/2019 08:32   Vas [place] Lower Extremity Venous (dvt)  Result Date: 07/31/2019  Lower Venous Study Indications: Elevated d-dimer.  Comparison Study: no prior Performing Technologist: Abram Sander RVS  Examination Guidelines: A complete evaluation includes B-mode imaging, spectral Doppler, color Doppler, and power Doppler as needed of all accessible portions of each vessel. Bilateral testing is considered an integral part of a complete examination. Limited examinations for reoccurring indications may be performed as noted.   +---------+---------------+---------+-----------+----------+--------------+ RIGHT    CompressibilityPhasicitySpontaneityPropertiesThrombus Aging +---------+---------------+---------+-----------+----------+--------------+ CFV      Full           Yes      Yes                                 +---------+---------------+---------+-----------+----------+--------------+ SFJ      Full                                                        +---------+---------------+---------+-----------+----------+--------------+ FV Prox  Full                                                        +---------+---------------+---------+-----------+----------+--------------+ FV Mid   Full                                                        +---------+---------------+---------+-----------+----------+--------------+ FV DistalFull                                                        +---------+---------------+---------+-----------+----------+--------------+  PFV      Full                                                        +---------+---------------+---------+-----------+----------+--------------+ POP      Full           Yes      Yes                                 +---------+---------------+---------+-----------+----------+--------------+ PTV      Full                                                        +---------+---------------+---------+-----------+----------+--------------+ PERO     Full                                                        +---------+---------------+---------+-----------+----------+--------------+   +---------+---------------+---------+-----------+----------+--------------+ LEFT     CompressibilityPhasicitySpontaneityPropertiesThrombus Aging +---------+---------------+---------+-----------+----------+--------------+ CFV      Full           Yes      Yes                                  +---------+---------------+---------+-----------+----------+--------------+ SFJ      Full                                                        +---------+---------------+---------+-----------+----------+--------------+ FV Prox  Full                                                        +---------+---------------+---------+-----------+----------+--------------+ FV Mid   Full                                                        +---------+---------------+---------+-----------+----------+--------------+ FV DistalFull                                                        +---------+---------------+---------+-----------+----------+--------------+ PFV      Full                                                        +---------+---------------+---------+-----------+----------+--------------+   POP      Full           Yes      Yes                                 +---------+---------------+---------+-----------+----------+--------------+ PTV      Full                                                        +---------+---------------+---------+-----------+----------+--------------+ PERO     Full                                                        +---------+---------------+---------+-----------+----------+--------------+     Summary: Right: There is no evidence of deep vein thrombosis in the lower extremity. No cystic structure found in the popliteal fossa. Left: There is no evidence of deep vein thrombosis in the lower extremity. No cystic structure found in the popliteal fossa.  *See table(s) above for measurements and observations. Electronically signed by Monica Martinez MD on 07/31/2019 at 5:10:42 PM.    Final     Scheduled Meds: . sodium chloride   Intravenous Once  . chlorhexidine gluconate (MEDLINE KIT)  15 mL Mouth Rinse BID  . Chlorhexidine Gluconate Cloth  6 each Topical Daily  . dexamethasone (DECADRON) injection  6 mg Intravenous Q24H  .  enoxaparin (LOVENOX) injection  90 mg Subcutaneous Q12H  . famotidine  20 mg Per Tube Once  . feeding supplement (PRO-STAT SUGAR FREE 64)  30 mL Per Tube TID  . free water  250 mL Per Tube Q4H  . insulin aspart  0-9 Units Subcutaneous Q4H  . mouth rinse  15 mL Mouth Rinse 10 times per day  . polyethylene glycol  17 g Per Tube BID  . sennosides  5 mL Per Tube BID  . tamsulosin  0.4 mg Oral BID   Continuous Infusions: . sodium chloride Stopped (08/01/19 0957)  . ceFEPime (MAXIPIME) IV 2 g (08/01/19 2112)  . feeding supplement (VITAL 1.5 CAL) 55 mL/hr at 07/31/19 1800  . fentaNYL infusion INTRAVENOUS 100 mcg/hr (08/01/19 1800)  . midazolam 2 mg/hr (08/01/19 1800)  . norepinephrine (LEVOPHED) Adult infusion Stopped (07/28/19 2137)  . vancomycin      LOS: 8 days   The patient is critically ill with multiple organ systems failure and requires high complexity decision making for assessment and support, frequent evaluation and titration of therapies, application of advanced monitoring technologies and extensive interpretation of multiple databases. Critical Care Time devoted to patient care services described in this note.   Time spent: 10 minutes  Little Ishikawa, DO [facility] Pager [phone]  If 7PM-7AM, please contact night-coverage [email or web address] Password [email or web address] 08/02/2019, 7:37 AM

## 2019-08-02 NOTE — Consult Note (Signed)
[facility]       Reason for Consult: MSSA bacteremia    Referring Physician: CHAMP autoconsult  Principal Problem:   Pneumonia due to COVID-19 virus Active Problems:   BPH (benign prostatic hyperplasia)   Acute respiratory failure due to COVID-19 ([place])   AKI (acute kidney injury) ([place])   Fluid overload   . sodium chloride   Intravenous Once  . chlorhexidine gluconate (MEDLINE KIT)  15 mL Mouth Rinse BID  . Chlorhexidine Gluconate Cloth  6 each Topical Daily  . dexamethasone (DECADRON) injection  6 mg Intravenous Q24H  . enoxaparin (LOVENOX) injection  90 mg Subcutaneous Q12H  . famotidine  20 mg Per Tube BID  . feeding supplement (PRO-STAT SUGAR FREE 64)  30 mL Per Tube TID  . free water  250 mL Per Tube Q4H  . furosemide  40 mg Intravenous Q6H  . insulin aspart  0-9 Units Subcutaneous Q4H  . mouth rinse  15 mL Mouth Rinse 10 times per day  . metolazone  5 mg Oral Once  . oxyCODONE  5 mg Oral Q6H  . polyethylene glycol  17 g Per Tube BID  . QUEtiapine  25 mg Oral BID  . sennosides  5 mL Per Tube BID  . tamsulosin  0.4 mg Oral BID    Recommendations: Cefazolin Can stop vancomycin and cefepime  TTE if/when able Remove lines as you are doing  Assessment: He has a positive blood culture in all 4 bottles with GPC and BCID with MSSA c/w MSSA bacteremia.  He developed a new fever and leukocytosis on 10/8 overnight and blood cultures sent, now positive.  Also with sputum culture positive for GPC in clusters, ? Septic emboli, though with ARDS-related multifocal infiltrates so unclear but with sputum positive as well with likely MSSA, would consider.    Antibiotics: Vancomycin and cefepime day 2  HPI: [name] is a 68 y.o. male with COVID at [facility] now completed 5 days of remdesivir who developed fever and blood cultures now with MSSA.  Also with ARDS.  Has been on decadron, received Actemra, convalescent plasma x 2.  REmains on vent.      Review of Systems: Unable to assess due to patient factors  Past Medical History:  Diagnosis Date  . COVID-19   . Heartburn     Social History   Tobacco Use  . Smoking status: Never Smoker  . Smokeless tobacco: Never Used  Substance Use Topics  . Alcohol use: Yes    Alcohol/week: 2.0 standard drinks    Types: 2 Shots of liquor per week    Comment: moderate  . Drug use: No    Family History  Problem Relation Age of Onset  . Colon cancer Neg Hx   . Colon polyps Neg Hx     No Known Allergies  Physical Exam: Remote consultation due to COVID  Lab Results  Component Value Date   WBC 18.5 (H) 08/02/2019   HGB 10.5 (L) 08/02/2019   HCT 34.1 (L) 08/02/2019   MCV 98.3 08/02/2019   PLT 281 08/02/2019    Lab Results  Component Value Date   CREATININE 1.54 (H) 08/02/2019   BUN 74 (H) 08/02/2019   NA 145 08/02/2019   K 4.3 08/02/2019   CL 99 08/02/2019   CO2 35 (H) 08/02/2019    Lab Results  Component Value Date   ALT 38 08/02/2019   AST 31 08/02/2019   ALKPHOS 91 08/02/2019  Microbiology: Recent Results (from the past 240 hour(s))  Blood Culture (routine x 2)     Status: None   Collection Time: 07/25/19  4:56 PM   Specimen: Left Antecubital; Blood  Result Value Ref Range Status   Specimen Description LEFT ANTECUBITAL  Final   Special Requests   Final    BOTTLES DRAWN AEROBIC AND ANAEROBIC Blood Culture results may not be optimal due to an excessive volume of blood received in culture bottles   Culture   Final    NO GROWTH 5 DAYS Performed at [facility], [address] [place], [place] [address]    Report Status 07/30/2019 FINAL  Final  Blood Culture (routine x 2)     Status: None   Collection Time: 07/25/19  5:01 PM   Specimen: BLOOD RIGHT HAND  Result Value Ref Range Status   Specimen Description BLOOD RIGHT HAND  Final   Special Requests   Final    BOTTLES DRAWN AEROBIC AND ANAEROBIC Blood Culture adequate volume   Culture   Final    NO GROWTH  5 DAYS Performed at [facility], [address] [place], [place] [address]    Report Status 07/30/2019 FINAL  Final  MRSA PCR Screening     Status: None   Collection Time: 07/26/19  2:09 AM   Specimen: Nasal Mucosa; Nasopharyngeal  Result Value Ref Range Status   MRSA by PCR NEGATIVE NEGATIVE Final    Comment:        The GeneXpert MRSA Assay (FDA approved for NASAL specimens only), is one component of a comprehensive MRSA colonization surveillance program. It is not intended to diagnose MRSA infection nor to guide or monitor treatment for MRSA infections. Performed at [facility], [place] [address], [place], [place] [address]   Culture, blood (Routine X 2) w Reflex to ID Panel     Status: None (Preliminary result)   Collection Time: 08/01/19 12:15 PM   Specimen: BLOOD  Result Value Ref Range Status   Specimen Description   Final    BLOOD RIGHT ARM Performed at [facility] [address], [place], [place] [address]    Special Requests   Final    BOTTLES DRAWN AEROBIC AND ANAEROBIC Blood Culture adequate volume Performed at [facility] [address] [place], [place] [address]    Culture  Setup Time   Final    GRAM POSITIVE COCCI IN CLUSTERS IN BOTH AEROBIC AND ANAEROBIC BOTTLES Organism ID to follow CRITICAL RESULT CALLED TO, READ BACK BY AND VERIFIED WITH: C. SHADE PHARMD, AT 1031 08/02/19 BY Rush [place]mark Performed at [facility], [place] [address] [place], [place] [address]    Culture GRAM POSITIVE COCCI  Final   Report Status PENDING  Incomplete  Blood Culture ID Panel (Reflexed)     Status: Abnormal   Collection Time: 08/01/19 12:15 PM  Result Value Ref Range Status   Enterococcus species NOT DETECTED NOT DETECTED Final   Listeria monocytogenes NOT DETECTED NOT DETECTED Final   Staphylococcus species DETECTED (A) NOT DETECTED Final    Comment: CRITICAL RESULT CALLED TO, READ BACK BY AND VERIFIED WITH:  C. SHADE PHARMD, AT 5929 08/02/19 BY D. VANHOOK    Staphylococcus aureus (BCID) DETECTED (A) NOT DETECTED Final    Comment: Methicillin (oxacillin) susceptible Staphylococcus aureus (MSSA). Preferred therapy is anti staphylococcal beta lactam antibiotic (Cefazolin or Nafcillin), unless clinically contraindicated. CRITICAL RESULT CALLED TO, READ BACK BY AND VERIFIED WITH:  C. SHADE PHARMD, AT 9509 08/02/19 BY D. VANHOOK    Methicillin resistance NOT DETECTED NOT DETECTED Final   Streptococcus species NOT DETECTED NOT DETECTED Final   Streptococcus agalactiae NOT DETECTED NOT DETECTED Final   Streptococcus pneumoniae NOT DETECTED NOT DETECTED Final   Streptococcus pyogenes NOT DETECTED NOT DETECTED Final   Acinetobacter baumannii NOT DETECTED NOT DETECTED Final   Enterobacteriaceae species NOT DETECTED NOT DETECTED Final   Enterobacter cloacae complex NOT DETECTED NOT DETECTED Final   Escherichia coli NOT DETECTED NOT DETECTED Final   Klebsiella oxytoca NOT DETECTED NOT DETECTED Final   Klebsiella pneumoniae NOT DETECTED NOT DETECTED Final   Proteus species NOT DETECTED NOT DETECTED Final   Serratia marcescens NOT DETECTED NOT DETECTED Final   Haemophilus influenzae NOT DETECTED NOT DETECTED Final   Neisseria meningitidis NOT DETECTED NOT DETECTED Final   Pseudomonas aeruginosa NOT DETECTED NOT DETECTED Final   Candida albicans NOT DETECTED NOT DETECTED Final   Candida glabrata NOT DETECTED NOT DETECTED Final   Candida krusei NOT DETECTED NOT DETECTED Final   Candida parapsilosis NOT DETECTED NOT DETECTED Final   Candida tropicalis NOT DETECTED NOT DETECTED Final    Comment: Performed at [facility], [place] [address], [place], [place] [address]  Culture, blood (Routine X 2) w Reflex to ID Panel     Status: None (Preliminary result)   Collection Time: 08/01/19 12:21 PM   Specimen: BLOOD  Result Value Ref Range Status   Specimen Description   Final    BLOOD RIGHT HAND Performed at  [facility] [address], [place], [place] [address]    Special Requests   Final    BOTTLES DRAWN AEROBIC AND ANAEROBIC Blood Culture adequate volume Performed at [facility] [address] [place], [place] [address]    Culture  Setup Time   Final    GRAM POSITIVE COCCI IN CLUSTERS IN BOTH AEROBIC AND ANAEROBIC BOTTLES Performed at [facility], [place] [address] [place], [place] [address]    Culture GRAM POSITIVE COCCI  Final   Report Status PENDING  Incomplete  Culture, respiratory     Status: None (Preliminary result)   Collection Time: 08/01/19  3:30 PM   Specimen: Tracheal Aspirate  Result Value Ref Range Status   Specimen Description   Final    TRACHEAL ASPIRATE Performed at [facility] [address], [place], [place] [address]    Special Requests   Final    NONE Performed at [facility] [place], [place] [address] [place], [place] [address]    Gram Stain   Final    RARE WBC PRESENT, PREDOMINANTLY PMN ABUNDANT GRAM POSITIVE COCCI IN CLUSTERS    Culture   Final    TOO YOUNG TO READ Performed at [facility], [place] [address] [place], [place] [address]    Report Status PENDING  Incomplete    Thayer Headings, [facility] [place] [email or web address] 08/02/2019, 10:22 AM

## 2019-08-03 ENCOUNTER — Inpatient Hospital Stay (HOSPITAL_COMMUNITY): Payer: Medicare HMO

## 2019-08-03 DIAGNOSIS — J9600 Acute respiratory failure, unspecified whether with hypoxia or hypercapnia: Secondary | ICD-10-CM | POA: Diagnosis not present

## 2019-08-03 DIAGNOSIS — U071 COVID-19: Secondary | ICD-10-CM | POA: Diagnosis not present

## 2019-08-03 DIAGNOSIS — R918 Other nonspecific abnormal finding of lung field: Secondary | ICD-10-CM | POA: Diagnosis not present

## 2019-08-03 DIAGNOSIS — N179 Acute kidney failure, unspecified: Secondary | ICD-10-CM | POA: Diagnosis not present

## 2019-08-03 DIAGNOSIS — J1289 Other viral pneumonia: Secondary | ICD-10-CM | POA: Diagnosis not present

## 2019-08-03 LAB — COMPREHENSIVE METABOLIC PANEL
ALT: 28 U/L (ref 0–44)
AST: 21 U/L (ref 15–41)
Albumin: 3.5 g/dL (ref 3.5–5.0)
Alkaline Phosphatase: 94 U/L (ref 38–126)
Anion gap: 11 (ref 5–15)
BUN: 99 mg/dL — ABNORMAL HIGH (ref 8–23)
CO2: 35 mmol/L — ABNORMAL HIGH (ref 22–32)
Calcium: 9.4 mg/dL (ref 8.9–10.3)
Chloride: 98 mmol/L (ref 98–111)
Creatinine, Ser: 1.61 mg/dL — ABNORMAL HIGH (ref 0.61–1.24)
GFR calc Af Amer: 51 mL/min — ABNORMAL LOW (ref >60)
GFR calc non Af Amer: 44 mL/min — ABNORMAL LOW (ref >60)
Glucose, Bld: 134 mg/dL — ABNORMAL HIGH (ref 70–99)
Potassium: 4.8 mmol/L (ref 3.5–5.1)
Sodium: 144 mmol/L (ref 135–145)
Total Bilirubin: 0.7 mg/dL (ref 0.3–1.2)
Total Protein: 6.5 g/dL (ref 6.5–8.1)

## 2019-08-03 LAB — POCT I-STAT 7, (LYTES, BLD GAS, ICA,H+H)
Acid-Base Excess: 12 mmol/L — ABNORMAL HIGH (ref 0.0–2.0)
Bicarbonate: 37.2 mmol/L — ABNORMAL HIGH (ref 20.0–28.0)
Calcium, Ion: 1.28 mmol/L (ref 1.15–1.40)
HCT: 32 % — ABNORMAL LOW (ref 39.0–52.0)
Hemoglobin: 10.9 g/dL — ABNORMAL LOW (ref 13.0–17.0)
O2 Saturation: 94 %
Patient temperature: 38.3
Potassium: 4.4 mmol/L (ref 3.5–5.1)
Sodium: 142 mmol/L (ref 135–145)
TCO2: 39 mmol/L — ABNORMAL HIGH (ref 22–32)
pCO2 arterial: 54.3 mmHg — ABNORMAL HIGH (ref 32.0–48.0)
pH, Arterial: 7.449 (ref 7.350–7.450)
pO2, Arterial: 76 mmHg — ABNORMAL LOW (ref 83.0–108.0)

## 2019-08-03 LAB — CBC
HCT: 35.2 % — ABNORMAL LOW (ref 39.0–52.0)
HCT: 35.6 % — ABNORMAL LOW (ref 39.0–52.0)
Hemoglobin: 11 g/dL — ABNORMAL LOW (ref 13.0–17.0)
Hemoglobin: 11.3 g/dL — ABNORMAL LOW (ref 13.0–17.0)
MCH: 30.6 pg (ref 26.0–34.0)
MCH: 31 pg (ref 26.0–34.0)
MCHC: 31.3 g/dL (ref 30.0–36.0)
MCHC: 31.7 g/dL (ref 30.0–36.0)
MCV: 98.1 fL (ref 80.0–100.0)
MCV: 97.8 fL (ref 80.0–100.0)
Platelets: 300 10*3/uL (ref 150–400)
Platelets: 289 10*3/uL (ref 150–400)
RBC: 3.59 MIL/uL — ABNORMAL LOW (ref 4.22–5.81)
RBC: 3.64 MIL/uL — ABNORMAL LOW (ref 4.22–5.81)
RDW: 14.6 % (ref 11.5–15.5)
RDW: 14.6 % (ref 11.5–15.5)
WBC: 18.1 10*3/uL — ABNORMAL HIGH (ref 4.0–10.5)
WBC: 20.3 10*3/uL — ABNORMAL HIGH (ref 4.0–10.5)
nRBC: 0.3 % — ABNORMAL HIGH (ref 0.0–0.2)
nRBC: 0.1 % (ref 0.0–0.2)

## 2019-08-03 LAB — URINE CULTURE: Culture: <10000 — AB

## 2019-08-03 LAB — GLUCOSE, CAPILLARY
Glucose-Capillary: 174 mg/dL — ABNORMAL HIGH (ref 70–99)
Glucose-Capillary: 155 mg/dL — ABNORMAL HIGH (ref 70–99)
Glucose-Capillary: 142 mg/dL — ABNORMAL HIGH (ref 70–99)

## 2019-08-03 LAB — C-REACTIVE PROTEIN: CRP: 1.1 mg/dL — ABNORMAL HIGH (ref <1.0)

## 2019-08-03 LAB — FERRITIN: Ferritin: 623 ng/mL — ABNORMAL HIGH (ref 24–336)

## 2019-08-03 LAB — D-DIMER, QUANTITATIVE: D-Dimer, Quant: 3.91 ug/mL-FEU — ABNORMAL HIGH (ref 0.00–0.50)

## 2019-08-03 MED ORDER — PANTOPRAZOLE SODIUM 40 MG IV SOLR: 40.0000 mg | Freq: Two times a day (BID) | INTRAVENOUS | Status: DC

## 2019-08-03 MED ORDER — PNEUMOCOCCAL VAC POLYVALENT 25 MCG/0.5ML IJ INJ
0.5000 mL | INJECTION | INTRAMUSCULAR | Status: DC
  Filled 2019-08-03: qty 0.5

## 2019-08-03 MED ORDER — SODIUM CHLORIDE 0.9 % IV SOLN
80.0000 mg | Freq: Once | INTRAVENOUS | Status: AC
  Administered 2019-08-03: 80 mg via INTRAVENOUS
  Filled 2019-08-03: qty 80

## 2019-08-03 MED ORDER — INFLUENZA VAC A&B SA ADJ QUAD 0.5 ML IM PRSY
0.5000 mL | PREFILLED_SYRINGE | INTRAMUSCULAR | Status: DC
  Filled 2019-08-03: qty 0.5

## 2019-08-03 MED ORDER — MORPHINE 100MG IN NS 100ML (1MG/ML) PREMIX INFUSION: 1.0000 mg/h | INTRAVENOUS | Status: DC

## 2019-08-03 MED ORDER — SODIUM CHLORIDE 0.9 % IV SOLN
8.0000 mg/h | INTRAVENOUS | Status: DC
  Administered 2019-08-03 – 2019-08-04 (×3): 8 mg/h via INTRAVENOUS
  Filled 2019-08-03 (×5): qty 80

## 2019-08-03 NOTE — Progress Notes (Addendum)
PROGRESS NOTE    [name]  PTW:[id number] DOB: 1951/07/24 DOA: 07/25/2019 PCP: Kathyrn Drown, MD   Brief Narrative:  68 year old BM PMHx GERD, BPH  Presented to [facility] ER with shortness of breath and hypoxia.  Patient was diagnosed with COVID 19 on July 13 9019.  Patient's wife also has COVID-19 but is asymptomatic.  Over the last 1 to 2 days, the patient's shortness of breath is increased.  Patient has been suffering with anorexia.  Patient's wife was actually having a conversation via teleconference with the patient's PCP.  PCP is also otherwise physician.  He had actually called to check on how the wife was doing.  The wife had mentioned to the physician that the patient has been laying in bed.  When the physician listened to how the patient was speaking over the phone, it was noted the patient was tachypneic.  EMS was activated.  When EMS arrived, the patient's O2 saturations were apparently in the 40 percentile range.  Patient was placed on nonrebreather.  He was transferred to the ER.  In the ER, his oxygen saturations on 100% nonrebreather were only in the 80s.  Patient was confused.  Patient was emergently intubated.  Patient given IV steroids.  Initial chest x-ray demonstrates diffuse multifocal infiltrates.  CT scan of the chest was negative for PE.  ABG prior to leaving for [place] demonstrates a pH 7.41 PCO2 44 PO2 of 87 on FiO2 100%.  AA gradient calculates out to 571. In ED patient was emergently intubated. Central line placed. Started on IV Decadron.  Subjective: Patient noted to be bacteremic overnight micro, patient continues to be intubated on a ventilator, tolerating sedation quite well.  Unable to further obtain review of systems  Assessment & Plan:   Principal Problem:   Pneumonia due to COVID-19 virus Active Problems:   BPH (benign prostatic hyperplasia)   Acute respiratory failure due to COVID-19 ([place])   AKI (acute kidney injury) (HCC)   Fluid  overload   COVID pneumonia/acute respiratory stress with hypoxia Sepsis secondary to MSSA bacteremia Vent Mode: PCV FiO2 (%):  [40 %] 40 % Set Rate:  [15 bmp] 15 bmp PEEP:  [10 cmH20] 10 cmH20 Plateau Pressure:  [20 cmH20-28 cmH20] 28 cmH20 - Attempting to wean vent/sedation daily - Continue Decadron - stop date 10/12 - Remdesivir Completed - 10/2 Actemra x1 dose - CRP improving appropriately - 10/3 transfused 1 unit convalescent plasma - Continue incentive spirometry, shutter valve as patient tolerates - no proning today - CT PE negative at admission; DVT BLE negative - d-dimer downtrending appropriately - 10/6 PCXR generally improving from previous - 10/10 PCXR poor inspiratory effort but appears generally the same diffuse airspace disease - 10/9 Elevated procal; febrile; WBC elevated to 19 - 10/10 MSSA bacteremia noted on labs, discontinue vancomycin/cefepime, initiate cefazolin per ID Recent Labs    08/01/19 0500 08/02/19 0457  DDIMER >20.00* 10.19*  FERRITIN 1,038* 897*  CRP 1.1* 2.6*    Questionable GI bleed, ongoing Questionable ileus, obstruction ruled out -Patient unable to tolerate tube feeds -Maroon NG output and black stool noted - FOBT pending -DC anticoagulation -Start PPI gtt -Flat plate imaging of abdomen unremarkable   Volume overload, improving -Diuresis on hold given increased creatinine - now improving Intake/Output Summary (Last 24 hours) at 08/03/2019 1249 Last data filed at 08/03/2019 1200 Gross per 24 hour  Intake 925.96 ml  Output 3000 ml  Net -2074.04 ml    Acute kidney injury,  in the setting of diuresis, improving - Trending upwards in the setting of lasix - continue to follow - Furosemide now on hold as above Recent Labs  Lab 07/29/19 0450 07/30/19 0451 07/31/19 0500 08/01/19 0500 08/02/19 0457  CREATININE 1.27* 1.43* 1.36* 1.96* 1.54*    Questionable history of BPH -Patient does not appear to be on any medications at home;  unclear diagnosis for BPH -Follow closely for urinary retention - likely remove foley once extubated and ambulatory   DVT prophylaxis: Lovenox held as of 10/11 due to ?GI bleed as above Code Status: Full Family communication: Called and discussed case with [name] (spouse)  Disposition Plan: TBD, pending clinical improvement  Consultants:  PCCM  Procedures/Significant Events:  10/2 CTA chest - Negative for PE 10/2 PCXR - Endotracheal tube remains with tip above the thoracic inlet. Recommend repositioning and advancement. Esophagogastric tube with tip and side port below the diaphragm. Unchanged diffuse, extensive bilateral heterogeneous opacity, consistent with multifocal infection, edema, and/or ARDS 10/3 PCXR - Slightly improved extensive bilateral heterogeneous opacifications (my read) 10/3 - Transfuse 1 unit CCP 10/6 PCXR - Generally improving aeration/diminished opacifications (R>L) 10/10 PCXR - Poor inspiration - little improvement in aeration 10/11 PCXR - Personally reviewed; Left lower lobe appears to be improving; RLL stable  I have personally reviewed and interpreted all radiology studies and my findings are as above.  Cultures 09/29 Novel coronavirus positive 10/02 blood LEFT antecubital NGTD 10/02 blood hand NGTD 10/03 MRSA by PCR negative  Antimicrobials: Anti-infectives (From admission, onward)   Start     Stop   07/26/19 1600  remdesivir 100 mg in sodium chloride 0.9 % 250 mL IVPB     07/30/19 1559   07/26/19 0215  remdesivir 200 mg in sodium chloride 0.9 % 250 mL IVPB     07/26/19 0520     LINES / TUBES:  #7.5 ETT 10/2>> RIGHT IJ CVL removed 08/02/19  Continuous Infusions: . sodium chloride Stopped (08/01/19 0957)  .  ceFAZolin (ANCEF) IV Stopped (08/03/19 0615)  . feeding supplement (VITAL 1.5 CAL) 1,000 mL (08/02/19 1030)  . fentaNYL infusion INTRAVENOUS 125 mcg/hr (08/03/19 1200)  . midazolam 2 mg/hr (08/03/19 1200)  . norepinephrine (LEVOPHED) Adult  infusion Stopped (07/28/19 2137)  . pantoprozole (PROTONIX) infusion 8 mg/hr (08/03/19 1200)   Objective: Vitals:   08/03/19 0900 08/03/19 1000 08/03/19 1100 08/03/19 1200  BP: 128/83 127/83 123/73 115/77  Pulse: (!) 103 97 91 90  Resp: 15 16 (!) 22 14  Temp:    99 F (37.2 C)  TempSrc:    Axillary  SpO2: 96% 96% 96% 96%  Weight:      Height:        Intake/Output Summary (Last 24 hours) at 08/03/2019 1249 Last data filed at 08/03/2019 1200 Gross per 24 hour  Intake 925.96 ml  Output 3000 ml  Net -2074.04 ml   Filed Weights   08/01/19 0500 08/02/19 0500 08/03/19 0500  Weight: 92.7 kg 92.8 kg 90 kg   Physical Exam:  General: Sedated/intubated, prone, no acute distress Eyes: negative scleral hemorrhage, negative anisocoria, negative icterus ENT: #7.5 ETT tube in place; NG outputting dark/maroon fluid to intermittent suction Neck:  Negative scars, masses, torticollis, lymphadenopathy, JVD, RIGHT IJ CVL in place Lungs: diminished bilaterally without overt wheezes or rales Cardiovascular: Bradycardic, without murmur gallop or rub normal S1 and S2 Abdomen: negative abdominal pain, nondistended, hypoactive bowel sounds, no rebound, no ascites, no appreciable mass Extremities: No significant cyanosis, clubbing Skin: Negative rashes,  lesions, ulcers  Data Reviewed: Care during the described time interval was provided by me .  I have reviewed this patient's available data, including medical history, events of note, physical examination, and all test results as part of my evaluation.  CBC: Recent Labs  Lab 07/28/19 0500  07/29/19 0450  07/30/19 0451  07/31/19 0500  08/01/19 0457 08/01/19 0500 08/02/19 0406 08/02/19 0457 08/03/19 0356  WBC 14.8*  --  10.7*  --  9.9  --  14.1*  --   --  19.2*  --  18.5*  --   NEUTROABS 13.6*  --  9.7*  --  8.6*  --   --   --   --   --   --   --   --   HGB 11.5*   < > 11.3*   < > 12.9*   < > 12.5*   < > 13.3 12.5* 10.9* 10.5* 10.9*  HCT  36.9*   < > 37.1*   < > 41.9   < > 41.0   < > 39.0 40.5 32.0* 34.1* 32.0*  MCV 97.1  --  100.3*  --  98.4  --  100.5*  --   --  98.1  --  98.3  --   PLT 450*  --  415*  --  411*  --  354  --   --  356  --  281  --    < > = values in this interval not displayed.   Basic Metabolic Panel: Recent Labs  Lab 07/28/19 0500  07/29/19 0450  07/30/19 0451  07/31/19 0500  08/01/19 0457 08/01/19 0500 08/02/19 0406 08/02/19 0457 08/03/19 0356  NA 151*   < > 152*   < > 149*   < > 147*   < > 143 146* 143 145 142  K 4.6   < > 4.9   < > 4.8   < > 4.9   < > 4.0 4.1 4.3 4.3 4.4  CL 113*  --  115*  --  105  --  107  --   --  93*  --  99  --   CO2 29  --  28  --  31  --  31  --   --  35*  --  35*  --   GLUCOSE 157*  --  151*  --  138*  --  149*  --   --  144*  --  157*  --   BUN 56*  --  50*  --  53*  --  55*  --   --  77*  --  74*  --   CREATININE 1.75*  --  1.27*  --  1.43*  --  1.36*  --   --  1.96*  --  1.54*  --   CALCIUM 8.5*  --  8.6*  --  9.0  --  9.0  --   --  9.2  --  9.4  --   MG 3.1*  --  2.9*  --  2.7*  --   --   --   --  2.6*  --  3.1*  --   PHOS 3.2  --  2.8  --  5.5*  --   --   --   --  5.7*  --  3.8  --    < > = values in this interval not displayed.   GFR: Estimated Creatinine Clearance: 53.3 mL/min (A) (by C-G  formula based on SCr of 1.54 mg/dL (H)).  Liver Function Tests: Recent Labs  Lab 07/29/19 0450 07/30/19 0451 07/31/19 0500 08/01/19 0500 08/02/19 0457  AST 32 61* 52* 56* 31  ALT 28 48* 53* 59* 38  ALKPHOS 58 67 69 85 91  BILITOT 0.4 0.6 0.4 0.7 0.7  PROT 5.8* 6.6 6.1* 6.3* 6.5  ALBUMIN 2.5* 2.9* 2.7* 2.9* 3.7   CBG: Recent Labs  Lab 08/02/19 1624 08/02/19 2056 08/02/19 2307 08/03/19 0316 08/03/19 1213  GLUCAP 185* 171* 179* 174* 155*   Urine analysis:    Component Value Date/Time   COLORURINE YELLOW 08/01/2019 1745   APPEARANCEUR HAZY (A) 08/01/2019 1745   LABSPEC 1.023 08/01/2019 1745   PHURINE 5.0 08/01/2019 1745   GLUCOSEU NEGATIVE 08/01/2019  1745   HGBUR NEGATIVE 08/01/2019 1745   BILIRUBINUR NEGATIVE 08/01/2019 1745   KETONESUR NEGATIVE 08/01/2019 1745   PROTEINUR NEGATIVE 08/01/2019 1745   NITRITE NEGATIVE 08/01/2019 1745   LEUKOCYTESUR NEGATIVE 08/01/2019 1745    Recent Results (from the past 240 hour(s))  Blood Culture (routine x 2)     Status: None   Collection Time: 07/25/19  4:56 PM   Specimen: Left Antecubital; Blood  Result Value Ref Range Status   Specimen Description LEFT ANTECUBITAL  Final   Special Requests   Final    BOTTLES DRAWN AEROBIC AND ANAEROBIC Blood Culture results may not be optimal due to an excessive volume of blood received in culture bottles   Culture   Final    NO GROWTH 5 DAYS Performed at [facility], [address] [place], [place] [address]    Report Status 07/30/2019 FINAL  Final  Blood Culture (routine x 2)     Status: None   Collection Time: 07/25/19  5:01 PM   Specimen: BLOOD RIGHT HAND  Result Value Ref Range Status   Specimen Description BLOOD RIGHT HAND  Final   Special Requests   Final    BOTTLES DRAWN AEROBIC AND ANAEROBIC Blood Culture adequate volume   Culture   Final    NO GROWTH 5 DAYS Performed at [facility], [address] [place], [place] [address]    Report Status 07/30/2019 FINAL  Final  MRSA PCR Screening     Status: None   Collection Time: 07/26/19  2:09 AM   Specimen: Nasal Mucosa; Nasopharyngeal  Result Value Ref Range Status   MRSA by PCR NEGATIVE NEGATIVE Final    Comment:        The GeneXpert MRSA Assay (FDA approved for NASAL specimens only), is one component of a comprehensive MRSA colonization surveillance program. It is not intended to diagnose MRSA infection nor to guide or monitor treatment for MRSA infections. Performed at [facility], [place] [address] [place], [place] [address]   Culture, blood (Routine X 2) w Reflex to ID Panel     Status: Abnormal (Preliminary result)   Collection Time: 08/01/19 12:15 PM    Specimen: BLOOD  Result Value Ref Range Status   Specimen Description   Final    BLOOD RIGHT ARM Performed at [facility] [address] [place], [place] [address]    Special Requests   Final    BOTTLES DRAWN AEROBIC AND ANAEROBIC Blood Culture adequate volume Performed at [place] [address], [place],  [address]    Culture  Setup Time   Final    GRAM POSITIVE COCCI IN CLUSTERS IN BOTH AEROBIC AND ANAEROBIC BOTTLES CRITICAL  RESULT CALLED TO, READ BACK BY AND VERIFIED WITH: C. SHADE PHARMD, AT 9563 08/02/19 BY Rush [place]mark Performed at [facility], [place] [address] [place], [place] [address]    Culture STAPHYLOCOCCUS AUREUS (A)  Final   Report Status PENDING  Incomplete  Blood Culture ID Panel (Reflexed)     Status: Abnormal   Collection Time: 08/01/19 12:15 PM  Result Value Ref Range Status   Enterococcus species NOT DETECTED NOT DETECTED Final   Listeria monocytogenes NOT DETECTED NOT DETECTED Final   Staphylococcus species DETECTED (A) NOT DETECTED Final    Comment: CRITICAL RESULT CALLED TO, READ BACK BY AND VERIFIED WITH: C. SHADE PHARMD, AT 3329 08/02/19 BY D. VANHOOK    Staphylococcus aureus (BCID) DETECTED (A) NOT DETECTED Final    Comment: Methicillin (oxacillin) susceptible Staphylococcus aureus (MSSA). Preferred therapy is anti staphylococcal beta lactam antibiotic (Cefazolin or Nafcillin), unless clinically contraindicated. CRITICAL RESULT CALLED TO, READ BACK BY AND VERIFIED WITH: C. SHADE PHARMD, AT 5188 08/02/19 BY D. VANHOOK    Methicillin resistance NOT DETECTED NOT DETECTED Final   Streptococcus species NOT DETECTED NOT DETECTED Final   Streptococcus agalactiae NOT DETECTED NOT DETECTED Final   Streptococcus pneumoniae NOT DETECTED NOT DETECTED Final   Streptococcus pyogenes NOT DETECTED NOT DETECTED Final   Acinetobacter baumannii NOT DETECTED NOT DETECTED Final   Enterobacteriaceae species NOT  DETECTED NOT DETECTED Final   Enterobacter cloacae complex NOT DETECTED NOT DETECTED Final   Escherichia coli NOT DETECTED NOT DETECTED Final   Klebsiella oxytoca NOT DETECTED NOT DETECTED Final   Klebsiella pneumoniae NOT DETECTED NOT DETECTED Final   Proteus species NOT DETECTED NOT DETECTED Final   Serratia marcescens NOT DETECTED NOT DETECTED Final   Haemophilus influenzae NOT DETECTED NOT DETECTED Final   Neisseria meningitidis NOT DETECTED NOT DETECTED Final   Pseudomonas aeruginosa NOT DETECTED NOT DETECTED Final   Candida albicans NOT DETECTED NOT DETECTED Final   Candida glabrata NOT DETECTED NOT DETECTED Final   Candida krusei NOT DETECTED NOT DETECTED Final   Candida parapsilosis NOT DETECTED NOT DETECTED Final   Candida tropicalis NOT DETECTED NOT DETECTED Final    Comment: Performed at [facility], [place] [address], [place], [place] [address]  Culture, blood (Routine X 2) w Reflex to ID Panel     Status: Abnormal (Preliminary result)   Collection Time: 08/01/19 12:21 PM   Specimen: BLOOD  Result Value Ref Range Status   Specimen Description   Final    BLOOD RIGHT HAND Performed at [place] [address], [place], [place] [address]    Special Requests   Final    BOTTLES DRAWN AEROBIC AND ANAEROBIC Blood Culture adequate volume Performed at [place] [address], [place], [place] [address]    Culture  Setup Time   Final    GRAM POSITIVE COCCI IN CLUSTERS IN BOTH AEROBIC AND ANAEROBIC BOTTLES CRITICAL RESULT CALLED TO, READ BACK BY AND VERIFIED WITH: C. SHADE PHARMD, AT 2355 08/02/19 BY Rush Landmark Performed at [facility], [place] [address] [place],  [address]    Culture STAPHYLOCOCCUS AUREUS (A)  Final   Report Status PENDING  Incomplete  Culture, respiratory     Status: None (Preliminary result)   Collection Time: 08/01/19  3:30 PM   Specimen: Tracheal Aspirate  Result Value Ref Range Status    Specimen Description   Final    TRACHEAL ASPIRATE Performed at [facility], [place] [address] [place], [place] [address]    Special Requests   Final    NONE Performed at [facility], [place] [address] [place], [place] [address]    Gram Stain   Final    RARE WBC PRESENT, PREDOMINANTLY PMN ABUNDANT GRAM POSITIVE COCCI IN CLUSTERS    Culture   Final    MODERATE STAPHYLOCOCCUS AUREUS SUSCEPTIBILITIES TO FOLLOW Performed at [facility], [place] [address] [place] [address], [place], [place] [address]    Report Status PENDING  Incomplete  Culture, Urine     Status: Abnormal   Collection Time: 08/01/19  5:45 PM   Specimen: Urine, Catheterized  Result Value Ref Range Status   Specimen Description   Final    URINE, CATHETERIZED Performed at [place] [address], [place], [place] [address]    Special Requests   Final    NONE Performed at [facility], [place] [address], [place], [place] [address]    Culture (A)  Final    <10,000 COLONIES/mL INSIGNIFICANT GROWTH Performed at [place] [address], [place], [place] [address]    Report Status 08/03/2019 FINAL  Final  MRSA PCR Screening     Status: None   Collection Time: 08/02/19 10:02 AM   Specimen: Nasal Mucosa; Nasopharyngeal  Result Value Ref Range Status   MRSA by PCR NEGATIVE NEGATIVE Final    Comment:        The GeneXpert MRSA Assay (FDA approved for NASAL specimens only), is one component of a comprehensive MRSA colonization surveillance program. It is not intended to diagnose MRSA infection nor to guide or monitor treatment for MRSA infections. Performed at [facility], [place] [address], [place], [place] [address]      Radiology Studies: Dg Chest Port 1 View  Result Date: 08/02/2019 CLINICAL DATA:  COVID-19 positive pneumonia. EXAM: PORTABLE CHEST 1 VIEW COMPARISON:  08/01/2019 FINDINGS: Endotracheal tube terminates 3.2  cm above carina. Nasogastric tube extends beyond the inferior aspect of the film. Right IJ line tip at mid right atrium. Normal heart size for level of inspiration. The Chin overlies the apices. No pleural effusion or pneumothorax. Worsened aeration, with multifocal interstitial and airspace disease. Primarily progressive within the left greater than right upper lung zones. IMPRESSION: Appropriate position of support apparatus. Progressive multifocal interstitial and airspace disease, consistent with pneumonia. Electronically Signed   By: Abigail Miyamoto M.D.   On: 08/02/2019 10:24   Dg Abd Portable 1v  Result Date: 08/02/2019 CLINICAL DATA:  Encounter for nausea and vomiting, thick brown gastric secretions EXAM: PORTABLE ABDOMEN - 1 VIEW COMPARISON:  Abdominal radiograph 07/26/2019 FINDINGS: Mild gaseous distention of the colon. No dilated loops of small bowel to suggest obstruction. No evidence for free air on supine view. Nasogastric tube side port projects over the stomach. No unexpected radiopaque foreign bodies. No acute finding in the visualized skeleton. IMPRESSION: Nonobstructive bowel gas pattern. Electronically Signed   By: Audie Pinto M.D.   On: 08/02/2019 11:34    Scheduled Meds: . sodium chloride   Intravenous Once  . chlorhexidine gluconate (MEDLINE KIT)  15 mL Mouth Rinse BID  . Chlorhexidine Gluconate Cloth  6 each Topical Daily  . dexamethasone (DECADRON) injection  6 mg Intravenous Q24H  . feeding supplement (PRO-STAT SUGAR FREE 64)  30 mL Per Tube TID  . free water  250 mL Per Tube Q4H  . insulin aspart  0-9 Units Subcutaneous Q4H  .  mouth rinse  15 mL Mouth Rinse 10 times per day  . oxyCODONE  5 mg Oral Q6H  . [START ON 08/06/2019] pantoprazole  40 mg Intravenous Q12H  . polyethylene glycol  17 g Per Tube BID  . QUEtiapine  25 mg Oral BID  . sennosides  5 mL Per Tube BID  . tamsulosin  0.4 mg Oral BID   Continuous Infusions: . sodium chloride Stopped (08/01/19 0957)   .  ceFAZolin (ANCEF) IV Stopped (08/03/19 0615)  . feeding supplement (VITAL 1.5 CAL) 1,000 mL (08/02/19 1030)  . fentaNYL infusion INTRAVENOUS 125 mcg/hr (08/03/19 1200)  . midazolam 2 mg/hr (08/03/19 1200)  . norepinephrine (LEVOPHED) Adult infusion Stopped (07/28/19 2137)  . pantoprozole (PROTONIX) infusion 8 mg/hr (08/03/19 1200)    LOS: 9 days   The patient is critically ill with multiple organ systems failure and requires high complexity decision making for assessment and support, frequent evaluation and titration of therapies, application of advanced monitoring technologies and extensive interpretation of multiple databases. Critical Care Time devoted to patient care services described in this note.   Time spent: 62 minutes  Little Ishikawa, DO [facility] Pager [phone]  If 7PM-7AM, please contact night-coverage [email or web address] Password [email or web address] 08/03/2019, 12:49 PM

## 2019-08-03 NOTE — Progress Notes (Signed)
NAME:  [name], MRN:  [id number], DOB:  [birth year], LOS: 9 ADMISSION DATE:  07/25/2019, CONSULTATION DATE:  10/3 REFERRING MD:  Bridgett Larsson, CHIEF COMPLAINT:  Dyspnea   Brief History   68 y/o male admitted on 10/3 from the [facility] ED with ARDS from COVID pneumonia leading to need for mechanical ventilation.    Past Medical History  COVID 19 Heartburn  Significant Hospital Events   10/3 admission 10/10 weaned 12 hours 10/11 dark stool  Consults:  PCCM  Procedures:  10/2 ETT>  10/2 R IJ CVL >   Significant Diagnostic Tests:  10/2 CT angiogram > personally reviewed, extensive bilateral airspace disease predominantly posterior  Micro Data:  9/20 SARS COV 2 > POSITIVE 10/2 blood >  10/9 blood > MSSA 2/4 10/9 urine >  10/9 resp >   Antimicrobials/COVID Rx  10/3 remdesivir > 10/6 10/3 actemra 10/3 decadron > 10/4 convalescent plasma, repeat 10/6  10/9 vanc > 10/10 10/9 cefepime > 10/10 10/10 Ancef >   Interim history/subjective:   Black gastric output Dark stool once overnight Increased work of breathing Weaned on ventilator 12 hours   Objective   Blood pressure (!) 153/84, pulse (!) 117, temperature 98.1 F (36.7 C), temperature source Axillary, resp. rate (!) 26, height 5\' 10"  (1.778 m), weight 90 kg, SpO2 90 %.    Vent Mode: PCV FiO2 (%):  [40 %] 40 % Set Rate:  [15 bmp] 15 bmp PEEP:  [10 cmH20] 10 cmH20 Plateau Pressure:  [20 cmH20-28 cmH20] 28 cmH20   Intake/Output Summary (Last 24 hours) at 08/03/2019 0804 Last data filed at 08/03/2019 0153 Gross per 24 hour  Intake 660.49 ml  Output 2750 ml  Net -2089.51 ml   Filed Weights   08/01/19 0500 08/02/19 0500 08/03/19 0500  Weight: 92.7 kg 92.8 kg 90 kg    Examination:  General:  In bed on vent HENT: NCAT ETT in place PULM: CTA B, vent supported breathing CV: RRR, no mgr GI: BS+, soft, nontender MSK: normal bulk and tone Neuro: sedated on vent   10/9 CXR > pulmonary parenchyma appears  improved, ETT in place, persistent consolidation in RLL  Resolved Hospital Problem list     Assessment & Plan:  Severe ARDS due to COVID 19 pneumonia: improving oxygenation and ventilator mechanics Continue mechanical ventilation per ARDS protocol Target TVol 6-8cc/kgIBW Target Plateau Pressure < 30cm H20 Target driving pressure less than 15 cm of water Target PaO2 55-65: titrate PEEP/FiO2 per protocol As long as PaO2 to FiO2 ratio is less than 1:150 position in prone position for 16 hours a day Check CVP daily if CVL in place Target CVP less than 4, diurese as necessary Ventilator associated pneumonia prevention protocol 10/11 wean vent as able, pressure support, hold diuresis  Anticoagulation/DVT prevention in setting of thrombophilia from COVID 19 Bleeding? Dark gastric output Start protonix infusion Check CBC Hold lovenox SCD  MSSA bacteremia: Abx per ID  Need for sedation/mechanical ventilation: severe agitation when weaning sedation RASS target -1 to -2 Oxycodone, seroquel Continue fentanyl/versed infusion  Best practice:  Diet: tube feeding Pain/Anxiety/Delirium protocol (if indicated): as above VAP protocol (if indicated): yes DVT prophylaxis: as above GI prophylaxis: famotidine Glucose control: SSI Mobility: bed rest Code Status: full Family Communication: per TRH Disposition: remain in ICU  Labs   CBC: Recent Labs  Lab 07/28/19 0500  07/29/19 0450  07/30/19 0451  07/31/19 0500  08/01/19 0457 08/01/19 0500 08/02/19 0406 08/02/19 0457 08/03/19 0356  WBC 14.8*  --  10.7*  --  9.9  --  14.1*  --   --  19.2*  --  18.5*  --   NEUTROABS 13.6*  --  9.7*  --  8.6*  --   --   --   --   --   --   --   --   HGB 11.5*   < > 11.3*   < > 12.9*   < > 12.5*   < > 13.3 12.5* 10.9* 10.5* 10.9*  HCT 36.9*   < > 37.1*   < > 41.9   < > 41.0   < > 39.0 40.5 32.0* 34.1* 32.0*  MCV 97.1  --  100.3*  --  98.4  --  100.5*  --   --  98.1  --  98.3  --   PLT 450*  --  415*   --  411*  --  354  --   --  356  --  281  --    < > = values in this interval not displayed.    Basic Metabolic Panel: Recent Labs  Lab 07/28/19 0500  07/29/19 0450  07/30/19 0451  07/31/19 0500  08/01/19 0457 08/01/19 0500 08/02/19 0406 08/02/19 0457 08/03/19 0356  NA 151*   < > 152*   < > 149*   < > 147*   < > 143 146* 143 145 142  K 4.6   < > 4.9   < > 4.8   < > 4.9   < > 4.0 4.1 4.3 4.3 4.4  CL 113*  --  115*  --  105  --  107  --   --  93*  --  99  --   CO2 29  --  28  --  31  --  31  --   --  35*  --  35*  --   GLUCOSE 157*  --  151*  --  138*  --  149*  --   --  144*  --  157*  --   BUN 56*  --  50*  --  53*  --  55*  --   --  77*  --  74*  --   CREATININE 1.75*  --  1.27*  --  1.43*  --  1.36*  --   --  1.96*  --  1.54*  --   CALCIUM 8.5*  --  8.6*  --  9.0  --  9.0  --   --  9.2  --  9.4  --   MG 3.1*  --  2.9*  --  2.7*  --   --   --   --  2.6*  --  3.1*  --   PHOS 3.2  --  2.8  --  5.5*  --   --   --   --  5.7*  --  3.8  --    < > = values in this interval not displayed.   GFR: Estimated Creatinine Clearance: 53.3 mL/min (A) (by C-G formula based on SCr of 1.54 mg/dL (H)). Recent Labs  Lab 07/30/19 0451 07/31/19 0500 08/01/19 0500 08/01/19 0943 08/02/19 0457  PROCALCITON  --   --   --  2.20  --   WBC 9.9 14.1* 19.2*  --  18.5*    Liver Function Tests: Recent Labs  Lab 07/29/19 0450 07/30/19 0451 07/31/19 0500 08/01/19 0500 08/02/19 0457  AST 32 61* 52* 56* 31  ALT 28 48* 53* 59* 38  ALKPHOS 58 67 69 85 91  BILITOT 0.4 0.6 0.4 0.7 0.7  PROT 5.8* 6.6 6.1* 6.3* 6.5  ALBUMIN 2.5* 2.9* 2.7* 2.9* 3.7   No results for input(s): LIPASE, AMYLASE in the last 168 hours. No results for input(s): AMMONIA in the last 168 hours.  ABG    Component Value Date/Time   PHART 7.449 08/03/2019 0356   PCO2ART 54.3 (H) 08/03/2019 0356   PO2ART 76.0 (L) 08/03/2019 0356   HCO3 37.2 (H) 08/03/2019 0356   TCO2 39 (H) 08/03/2019 0356   ACIDBASEDEF 1.0 07/27/2019 0546    O2SAT 94.0 08/03/2019 0356     Coagulation Profile: No results for input(s): INR, PROTIME in the last 168 hours.  Cardiac Enzymes: No results for input(s): CKTOTAL, CKMB, CKMBINDEX, TROPONINI in the last 168 hours.  HbA1C: Hgb A1c MFr Bld  Date/Time Value Ref Range Status  07/26/2019 05:00 AM 6.1 (H) 4.8 - 5.6 % Final    Comment:    (NOTE) Pre diabetes:          5.7%-6.4% Diabetes:              >6.4% Glycemic control for   <7.0% adults with diabetes     CBG: Recent Labs  Lab 08/02/19 1310 08/02/19 1624 08/02/19 2056 08/02/19 2307 08/03/19 0316  GLUCAP 177* 185* 171* 179* 174*     Critical care time: 31 minutes     Roselie Awkward, MD [facility] Pager: [phone] Cell: [phone] If no response, call [phone]

## 2019-08-03 NOTE — Plan of Care (Signed)
  Problem: Education: Goal: Knowledge of risk factors and measures for prevention of condition will improve Outcome: Progressing   Problem: Respiratory: Goal: Will maintain a patent airway Outcome: Progressing Goal: Complications related to the disease process, condition or treatment will be avoided or minimized Outcome: Progressing   Problem: Education: Goal: Knowledge of General Education information will improve Description: Including pain rating scale, medication(s)/side effects and non-pharmacologic comfort measures Outcome: Progressing   Problem: Health Behavior/Discharge Planning: Goal: Ability to manage health-related needs will improve Outcome: Progressing   Problem: Clinical Measurements: Goal: Ability to maintain clinical measurements within normal limits will improve Outcome: Progressing Goal: Will remain free from infection Outcome: Progressing Goal: Diagnostic test results will improve Outcome: Progressing Goal: Respiratory complications will improve Outcome: Progressing Goal: Cardiovascular complication will be avoided Outcome: Progressing   Problem: Activity: Goal: Risk for activity intolerance will decrease Outcome: Progressing   Problem: Nutrition: Goal: Adequate nutrition will be maintained Outcome: Progressing   Problem: Coping: Goal: Level of anxiety will decrease Outcome: Progressing   Problem: Elimination: Goal: Will not experience complications related to bowel motility Outcome: Progressing Goal: Will not experience complications related to urinary retention Outcome: Progressing   Problem: Pain Managment: Goal: General experience of comfort will improve Outcome: Progressing   Problem: Safety: Goal: Ability to remain free from injury will improve Outcome: Progressing   Problem: Skin Integrity: Goal: Risk for impaired skin integrity will decrease Outcome: Progressing   

## 2019-08-03 NOTE — Progress Notes (Signed)
Pt O2 sat in upper 80's.  Suctioned x3 and did O2 breaths on Vent.  Pt continues to desat.  Spoke with RT and they asked me to turn FiO2 to 100% until they are able to come.  Adjust vent FiO2 to 100%.

## 2019-08-04 DIAGNOSIS — D72829 Elevated white blood cell count, unspecified: Secondary | ICD-10-CM

## 2019-08-04 DIAGNOSIS — J1289 Other viral pneumonia: Secondary | ICD-10-CM | POA: Diagnosis not present

## 2019-08-04 DIAGNOSIS — Z9911 Dependence on respirator [ventilator] status: Secondary | ICD-10-CM | POA: Diagnosis not present

## 2019-08-04 DIAGNOSIS — J80 Acute respiratory distress syndrome: Secondary | ICD-10-CM | POA: Diagnosis not present

## 2019-08-04 DIAGNOSIS — J9600 Acute respiratory failure, unspecified whether with hypoxia or hypercapnia: Secondary | ICD-10-CM | POA: Diagnosis not present

## 2019-08-04 DIAGNOSIS — U071 COVID-19: Secondary | ICD-10-CM | POA: Diagnosis not present

## 2019-08-04 LAB — CULTURE, BLOOD (ROUTINE X 2)
Special Requests: ADEQUATE
Special Requests: ADEQUATE

## 2019-08-04 LAB — POCT I-STAT 7, (LYTES, BLD GAS, ICA,H+H)
Acid-Base Excess: 9 mmol/L — ABNORMAL HIGH (ref 0.0–2.0)
Bicarbonate: 35.5 mmol/L — ABNORMAL HIGH (ref 20.0–28.0)
Calcium, Ion: 1.22 mmol/L (ref 1.15–1.40)
HCT: 35 % — ABNORMAL LOW (ref 39.0–52.0)
Hemoglobin: 11.9 g/dL — ABNORMAL LOW (ref 13.0–17.0)
O2 Saturation: 91 %
Patient temperature: 99.6
Potassium: 5 mmol/L (ref 3.5–5.1)
Sodium: 146 mmol/L — ABNORMAL HIGH (ref 135–145)
TCO2: 37 mmol/L — ABNORMAL HIGH (ref 22–32)
pCO2 arterial: 59.1 mmHg — ABNORMAL HIGH (ref 32.0–48.0)
pH, Arterial: 7.389 (ref 7.350–7.450)
pO2, Arterial: 65 mmHg — ABNORMAL LOW (ref 83.0–108.0)

## 2019-08-04 LAB — CBC
HCT: 36.5 % — ABNORMAL LOW (ref 39.0–52.0)
Hemoglobin: 11.3 g/dL — ABNORMAL LOW (ref 13.0–17.0)
MCH: 30.8 pg (ref 26.0–34.0)
MCHC: 31 g/dL (ref 30.0–36.0)
MCV: 99.5 fL (ref 80.0–100.0)
Platelets: 373 10*3/uL (ref 150–400)
RBC: 3.67 MIL/uL — ABNORMAL LOW (ref 4.22–5.81)
RDW: 15 % (ref 11.5–15.5)
WBC: 26.3 10*3/uL — ABNORMAL HIGH (ref 4.0–10.5)
nRBC: 0.2 % (ref 0.0–0.2)

## 2019-08-04 LAB — COMPREHENSIVE METABOLIC PANEL
ALT: 22 U/L (ref 0–44)
AST: 27 U/L (ref 15–41)
Albumin: 3.5 g/dL (ref 3.5–5.0)
Alkaline Phosphatase: 121 U/L (ref 38–126)
Anion gap: 14 (ref 5–15)
BUN: 104 mg/dL — ABNORMAL HIGH (ref 8–23)
CO2: 34 mmol/L — ABNORMAL HIGH (ref 22–32)
Calcium: 9.2 mg/dL (ref 8.9–10.3)
Chloride: 100 mmol/L (ref 98–111)
Creatinine, Ser: 2.09 mg/dL — ABNORMAL HIGH (ref 0.61–1.24)
GFR calc Af Amer: 37 mL/min — ABNORMAL LOW (ref >60)
GFR calc non Af Amer: 32 mL/min — ABNORMAL LOW (ref >60)
Glucose, Bld: 130 mg/dL — ABNORMAL HIGH (ref 70–99)
Potassium: 5 mmol/L (ref 3.5–5.1)
Sodium: 148 mmol/L — ABNORMAL HIGH (ref 135–145)
Total Bilirubin: 0.5 mg/dL (ref 0.3–1.2)
Total Protein: 6.8 g/dL (ref 6.5–8.1)

## 2019-08-04 LAB — GLUCOSE, CAPILLARY
Glucose-Capillary: 140 mg/dL — ABNORMAL HIGH (ref 70–99)
Glucose-Capillary: 151 mg/dL — ABNORMAL HIGH (ref 70–99)
Glucose-Capillary: 153 mg/dL — ABNORMAL HIGH (ref 70–99)
Glucose-Capillary: 155 mg/dL — ABNORMAL HIGH (ref 70–99)
Glucose-Capillary: 163 mg/dL — ABNORMAL HIGH (ref 70–99)
Glucose-Capillary: 164 mg/dL — ABNORMAL HIGH (ref 70–99)
Glucose-Capillary: 175 mg/dL — ABNORMAL HIGH (ref 70–99)
Glucose-Capillary: 207 mg/dL — ABNORMAL HIGH (ref 70–99)

## 2019-08-04 LAB — FERRITIN: Ferritin: 704 ng/mL — ABNORMAL HIGH (ref 24–336)

## 2019-08-04 LAB — C-REACTIVE PROTEIN: CRP: 0.8 mg/dL (ref <1.0)

## 2019-08-04 LAB — D-DIMER, QUANTITATIVE: D-Dimer, Quant: 5.44 ug/mL-FEU — ABNORMAL HIGH (ref 0.00–0.50)

## 2019-08-04 MED ORDER — INFLUENZA VAC A&B SA ADJ QUAD 0.5 ML IM PRSY
0.5000 mL | PREFILLED_SYRINGE | INTRAMUSCULAR | Status: DC | PRN
  Filled 2019-08-04: qty 0.5

## 2019-08-04 MED ORDER — ACETAMINOPHEN 650 MG RE SUPP
650.0000 mg | RECTAL | Status: DC | PRN
  Administered 2019-08-04: 18:00:00 650 mg via RECTAL
  Filled 2019-08-04: qty 1

## 2019-08-04 MED ORDER — PNEUMOCOCCAL VAC POLYVALENT 25 MCG/0.5ML IJ INJ
0.5000 mL | INJECTION | INTRAMUSCULAR | Status: DC | PRN
  Filled 2019-08-04: qty 0.5

## 2019-08-04 MED ORDER — PANTOPRAZOLE SODIUM 40 MG IV SOLR
40.0000 mg | Freq: Two times a day (BID) | INTRAVENOUS | Status: DC
  Administered 2019-08-04 – 2019-08-08 (×9): 40 mg via INTRAVENOUS
  Filled 2019-08-04 (×9): qty 40

## 2019-08-04 NOTE — Progress Notes (Signed)
NAME:  [name], MRN:  [id number], DOB:  [birth year], LOS: 70 ADMISSION DATE:  07/25/2019, CONSULTATION DATE:  10/3 REFERRING MD:  Bridgett Larsson, CHIEF COMPLAINT:  Dyspnea   Brief History   68 y/o male admitted on 10/3 from the [facility] ED with ARDS from COVID pneumonia leading to need for mechanical ventilation.    Past Medical History  COVID 19 Heartburn  Significant Hospital Events   10/3 admission 10/10 weaned 12 hours 10/11 dark stool  Consults:  PCCM  Procedures:  10/2 ETT>  10/2 R IJ CVL >   Significant Diagnostic Tests:  10/2 CT angiogram > personally reviewed, extensive bilateral airspace disease predominantly posterior  Micro Data:  9/20 SARS COV 2 > POSITIVE 10/2 blood >  10/9 blood > MSSA 2/4 10/9 urine >  10/9 resp >   Antimicrobials/COVID Rx  10/3 remdesivir > 10/6 10/3 actemra 10/3 decadron > 10/4 convalescent plasma, repeat 10/6 10/9 vanc > 10/10 10/9 cefepime > 10/10 10/10 Ancef >   Interim history/subjective:   Doing well overnight.  Remains on mechanical ventilation.  Increased FiO2 requirements yesterday.  Objective   Blood pressure 126/86, pulse (!) 119, temperature 99.6 F (37.6 C), temperature source Oral, resp. rate 16, height 5\' 10"  (1.778 m), weight 90 kg, SpO2 92 %.    Vent Mode: PCV FiO2 (%):  [40 %-100 %] 100 % Set Rate:  [15 bmp] 15 bmp PEEP:  [10 cmH20] 10 cmH20 Plateau Pressure:  [21 QAS34-19 cmH20] 26 cmH20   Intake/Output Summary (Last 24 hours) at 08/04/2019 0755 Last data filed at 08/04/2019 0700 Gross per 24 hour  Intake 1590.42 ml  Output 2475 ml  Net -884.58 ml   Filed Weights   08/01/19 0500 08/02/19 0500 08/03/19 0500  Weight: 92.7 kg 92.8 kg 90 kg    Examination:  General: Intubated on mechanical ventilation sedated, critically ill HENT: NCAT, endotracheal tube in place, OG tube in place with heme positive secretions PULM: Bilateral ventilated breath sounds no crackles no wheeze CV: Regular rate and rhythm,  S1-S2 no MRG GI: Soft nontender nondistended MSK: Normal bulk and tone Neuro: Sedated on mechanical ventilation  10/11 chest x-ray: Persistent bilateral opacities. The patient's images have been independently reviewed by me.     Resolved Hospital Problem list     Assessment & Plan:  Severe ARDS due to COVID 19 pneumonia: improving oxygenation and ventilator mechanics Continue mechanical ventilation per ARDS protocol Target TVol 6-8cc/kgIBW Target Plateau Pressure < 30cm H20 Target driving pressure less than 15 cm of water Target PaO2 55-65: titrate PEEP/FiO2 per protocol As long as PaO2 to FiO2 ratio is less than 1:150 position in prone position for 16 hours a day Check CVP daily if CVL in place Target CVP less than 4, diurese as necessary Ventilator associated pneumonia prevention protocol No additional diuresis at this time. Repeat chest x-ray in the a.m. or PRN  Anticoagulation/DVT prevention in setting of thrombophilia from COVID 19 Bleeding? Dark gastric output Consider restarting DVT prophylaxis Continue Protonix IV twice daily Continue to observe hemoglobin, currently stable  MSSA bacteremia, Rising Leukocytosis  Antibiotics per ID Continue Ancef  Need for sedation/mechanical ventilation: severe agitation when weaning sedation Target rest goal -1 to -2 Sedation with fentanyl infusion and Vidaza Lamb. Wean from medazepam drip as tolerated. Prefer bolus method of her drip.  To maintain sedation needs.  Best practice:  Diet: tube feeding Pain/Anxiety/Delirium protocol (if indicated): as above VAP protocol (if indicated): yes DVT prophylaxis: as above GI prophylaxis:  famotidine Glucose control: SSI Mobility: bed rest Code Status: full Family Communication: per [facility] Disposition: remain in ICU  Labs    CBC: Recent Labs  Lab 07/29/19 0450  07/30/19 0451  08/01/19 0500  08/02/19 0457 08/03/19 0356 08/03/19 1035 08/03/19 1640 08/04/19 0444 08/04/19 0535   WBC 10.7*  --  9.9   < > 19.2*  --  18.5*  --  18.1* 20.3*  --  26.3*  NEUTROABS 9.7*  --  8.6*  --   --   --   --   --   --   --   --   --   HGB 11.3*   < > 12.9*   < > 12.5*   < > 10.5* 10.9* 11.0* 11.3* 11.9* 11.3*  HCT 37.1*   < > 41.9   < > 40.5   < > 34.1* 32.0* 35.2* 35.6* 35.0* 36.5*  MCV 100.3*  --  98.4   < > 98.1  --  98.3  --  98.1 97.8  --  99.5  PLT 415*  --  411*   < > 356  --  281  --  300 289  --  373   < > = values in this interval not displayed.    Basic Metabolic Panel: Recent Labs  Lab 07/29/19 0450  07/30/19 0451  07/31/19 0500  08/01/19 0500 08/02/19 0406 08/02/19 0457 08/03/19 0356 08/03/19 1035 08/04/19 0444  NA 152*   < > 149*   < > 147*   < > 146* 143 145 142 144 146*  K 4.9   < > 4.8   < > 4.9   < > 4.1 4.3 4.3 4.4 4.8 5.0  CL 115*  --  105  --  107  --  93*  --  99  --  98  --   CO2 28  --  31  --  31  --  35*  --  35*  --  35*  --   GLUCOSE 151*  --  138*  --  149*  --  144*  --  157*  --  134*  --   BUN 50*  --  53*  --  55*  --  77*  --  74*  --  99*  --   CREATININE 1.27*  --  1.43*  --  1.36*  --  1.96*  --  1.54*  --  1.61*  --   CALCIUM 8.6*  --  9.0  --  9.0  --  9.2  --  9.4  --  9.4  --   MG 2.9*  --  2.7*  --   --   --  2.6*  --  3.1*  --   --   --   PHOS 2.8  --  5.5*  --   --   --  5.7*  --  3.8  --   --   --    < > = values in this interval not displayed.   GFR: Estimated Creatinine Clearance: 50.9 mL/min (A) (by C-G formula based on SCr of 1.61 mg/dL (H)). Recent Labs  Lab 08/01/19 0943 08/02/19 0457 08/03/19 1035 08/03/19 1640 08/04/19 0535  PROCALCITON 2.20  --   --   --   --   WBC  --  18.5* 18.1* 20.3* 26.3*    Liver Function Tests: Recent Labs  Lab 07/30/19 0451 07/31/19 0500 08/01/19 0500 08/02/19 0457 08/03/19 1035  AST 61*  52* 56* 31 21  ALT 48* 53* 59* 38 28  ALKPHOS 67 69 85 91 94  BILITOT 0.6 0.4 0.7 0.7 0.7  PROT 6.6 6.1* 6.3* 6.5 6.5  ALBUMIN 2.9* 2.7* 2.9* 3.7 3.5   No results for input(s):  LIPASE, AMYLASE in the last 168 hours. No results for input(s): AMMONIA in the last 168 hours.  ABG    Component Value Date/Time   PHART 7.389 08/04/2019 0444   PCO2ART 59.1 (H) 08/04/2019 0444   PO2ART 65.0 (L) 08/04/2019 0444   HCO3 35.5 (H) 08/04/2019 0444   TCO2 37 (H) 08/04/2019 0444   ACIDBASEDEF 1.0 07/27/2019 0546   O2SAT 91.0 08/04/2019 0444     Coagulation Profile: No results for input(s): INR, PROTIME in the last 168 hours.  Cardiac Enzymes: No results for input(s): CKTOTAL, CKMB, CKMBINDEX, TROPONINI in the last 168 hours.  HbA1C: Hgb A1c MFr Bld  Date/Time Value Ref Range Status  07/26/2019 05:00 AM 6.1 (H) 4.8 - 5.6 % Final    Comment:    (NOTE) Pre diabetes:          5.7%-6.4% Diabetes:              >6.4% Glycemic control for   <7.0% adults with diabetes     CBG: Recent Labs  Lab 08/03/19 1213 08/03/19 1946 08/04/19 0025 08/04/19 0332 08/04/19 0730  GLUCAP 155* 142* 140* 153* 151*     This patient is critically ill with multiple organ system failure; which, requires frequent high complexity decision making, assessment, support, evaluation, and titration of therapies. This was completed through the application of advanced monitoring technologies and extensive interpretation of multiple databases. During this encounter critical care time was devoted to patient care services described in this note for 32 minutes.   Garner Nash, DO [facility] 08/04/2019 7:55 AM  Personal pager: [phone] If unanswered, please page CCM On-call: [phone]

## 2019-08-04 NOTE — Progress Notes (Signed)
[facility]   Reason for visit: Follow up on Staph aureus bacteremia  Interval History: line removed, respiratory cultures also with Staph aureus.  Remains intubated.  WBC up to 26.3.   Cefazolin day 3 Due to COVID, patient not seen in person  Physical Exam: Not examined Vitals:   08/04/19 0700 08/04/19 0800  BP: 126/86   Pulse: (!) 119   Resp: 16   Temp:  100.2 F (37.9 C)  SpO2: 92%     Lab Results  Component Value Date   WBC 26.3 (H) 08/04/2019   HGB 11.3 (L) 08/04/2019   HCT 36.5 (L) 08/04/2019   MCV 99.5 08/04/2019   PLT 373 08/04/2019    Lab Results  Component Value Date   CREATININE 2.09 (H) 08/04/2019   BUN PENDING 08/04/2019   NA 148 (H) 08/04/2019   K 5.0 08/04/2019   CL 100 08/04/2019   CO2 34 (H) 08/04/2019    Lab Results  Component Value Date   ALT 22 08/04/2019   AST 27 08/04/2019   ALKPHOS 121 08/04/2019     Microbiology: Recent Results (from the past 240 hour(s))  Blood Culture (routine x 2)     Status: None   Collection Time: 07/25/19  4:56 PM   Specimen: Left Antecubital; Blood  Result Value Ref Range Status   Specimen Description LEFT ANTECUBITAL  Final   Special Requests   Final    BOTTLES DRAWN AEROBIC AND ANAEROBIC Blood Culture results may not be optimal due to an excessive volume of blood received in culture bottles   Culture   Final    NO GROWTH 5 DAYS Performed at [facility], [address] [place], [place] [address]    Report Status 07/30/2019 FINAL  Final  Blood Culture (routine x 2)     Status: None   Collection Time: 07/25/19  5:01 PM   Specimen: BLOOD RIGHT HAND  Result Value Ref Range Status   Specimen Description BLOOD RIGHT HAND  Final   Special Requests   Final    BOTTLES DRAWN AEROBIC AND ANAEROBIC Blood Culture adequate volume   Culture   Final    NO GROWTH 5 DAYS Performed at [facility], [address] [place], [place] [address]    Report Status 07/30/2019 FINAL  Final  MRSA  PCR Screening     Status: None   Collection Time: 07/26/19  2:09 AM   Specimen: Nasal Mucosa; Nasopharyngeal  Result Value Ref Range Status   MRSA by PCR NEGATIVE NEGATIVE Final    Comment:        The GeneXpert MRSA Assay (FDA approved for NASAL specimens only), is one component of a comprehensive MRSA colonization surveillance program. It is not intended to diagnose MRSA infection nor to guide or monitor treatment for MRSA infections. Performed at [facility], [place] [address], [place], [place] [address]   Culture, blood (Routine X 2) w Reflex to ID Panel     Status: Abnormal   Collection Time: 08/01/19 12:15 PM   Specimen: BLOOD  Result Value Ref Range Status   Specimen Description   Final    BLOOD RIGHT ARM Performed at [facility] [address] [place], [place] [address]    Special Requests   Final    BOTTLES DRAWN AEROBIC AND ANAEROBIC Blood Culture adequate volume Performed at [facility] [address], [place], [place] [address]    Culture  Setup  Time   Final    GRAM POSITIVE COCCI IN CLUSTERS IN BOTH AEROBIC AND ANAEROBIC BOTTLES CRITICAL RESULT CALLED TO, READ BACK BY AND VERIFIED WITH: C. SHADE PHARMD, AT 2505 08/02/19 BY Rush Landmark Performed at [facility], [place] [address] [place], [place] [address]    Culture STAPHYLOCOCCUS AUREUS (A)  Final   Report Status 08/04/2019 FINAL  Final   Organism ID, Bacteria STAPHYLOCOCCUS AUREUS  Final      Susceptibility   Staphylococcus aureus - MIC*    CIPROFLOXACIN <=0.5 SENSITIVE Sensitive     ERYTHROMYCIN <=0.25 SENSITIVE Sensitive     GENTAMICIN <=0.5 SENSITIVE Sensitive     OXACILLIN 0.5 SENSITIVE Sensitive     TETRACYCLINE <=1 SENSITIVE Sensitive     VANCOMYCIN 1 SENSITIVE Sensitive     TRIMETH/SULFA <=10 SENSITIVE Sensitive     CLINDAMYCIN <=0.25 SENSITIVE Sensitive     RIFAMPIN <=0.5 SENSITIVE Sensitive     Inducible Clindamycin NEGATIVE  Sensitive     * STAPHYLOCOCCUS AUREUS  Blood Culture ID Panel (Reflexed)     Status: Abnormal   Collection Time: 08/01/19 12:15 PM  Result Value Ref Range Status   Enterococcus species NOT DETECTED NOT DETECTED Final   Listeria monocytogenes NOT DETECTED NOT DETECTED Final   Staphylococcus species DETECTED (A) NOT DETECTED Final    Comment: CRITICAL RESULT CALLED TO, READ BACK BY AND VERIFIED WITH: C. SHADE PHARMD, AT 3419 08/02/19 BY D. VANHOOK    Staphylococcus aureus (BCID) DETECTED (A) NOT DETECTED Final    Comment: Methicillin (oxacillin) susceptible Staphylococcus aureus (MSSA). Preferred therapy is anti staphylococcal beta lactam antibiotic (Cefazolin or Nafcillin), unless clinically contraindicated. CRITICAL RESULT CALLED TO, READ BACK BY AND VERIFIED WITH: C. SHADE PHARMD, AT [phone] 08/02/19 BY D. VANHOOK    Methicillin resistance NOT DETECTED NOT DETECTED Final   Streptococcus species NOT DETECTED NOT DETECTED Final   Streptococcus agalactiae NOT DETECTED NOT DETECTED Final   Streptococcus pneumoniae NOT DETECTED NOT DETECTED Final   Streptococcus pyogenes NOT DETECTED NOT DETECTED Final   Acinetobacter baumannii NOT DETECTED NOT DETECTED Final   Enterobacteriaceae species NOT DETECTED NOT DETECTED Final   Enterobacter cloacae complex NOT DETECTED NOT DETECTED Final   Escherichia coli NOT DETECTED NOT DETECTED Final   Klebsiella oxytoca NOT DETECTED NOT DETECTED Final   Klebsiella pneumoniae NOT DETECTED NOT DETECTED Final   Proteus species NOT DETECTED NOT DETECTED Final   Serratia marcescens NOT DETECTED NOT DETECTED Final   Haemophilus influenzae NOT DETECTED NOT DETECTED Final   Neisseria meningitidis NOT DETECTED NOT DETECTED Final   Pseudomonas aeruginosa NOT DETECTED NOT DETECTED Final   Candida albicans NOT DETECTED NOT DETECTED Final   Candida glabrata NOT DETECTED NOT DETECTED Final   Candida krusei NOT DETECTED NOT DETECTED Final   Candida parapsilosis NOT  DETECTED NOT DETECTED Final   Candida tropicalis NOT DETECTED NOT DETECTED Final    Comment: Performed at [facility], [place] [address] [place], [place] [address]  Culture, blood (Routine X 2) w Reflex to ID Panel     Status: Abnormal   Collection Time: 08/01/19 12:21 PM   Specimen: BLOOD  Result Value Ref Range Status   Specimen Description   Final    BLOOD RIGHT HAND Performed at [facility] [address], [place], [place] [address]    Special Requests   Final    BOTTLES DRAWN AEROBIC AND ANAEROBIC Blood Culture adequate volume Performed at [facility], [address] [place], [place] [address]    Culture  Setup Time   Final    GRAM POSITIVE COCCI IN CLUSTERS IN BOTH AEROBIC AND ANAEROBIC BOTTLES CRITICAL RESULT CALLED TO, READ BACK BY AND VERIFIED WITH: C. SHADE PHARMD, AT 4403 08/02/19 BY D. VANHOOK    Culture (A)  Final    STAPHYLOCOCCUS AUREUS SUSCEPTIBILITIES PERFORMED ON PREVIOUS CULTURE WITHIN THE LAST 5 DAYS. Performed at [facility], [place] [address], [place], [place] [address]    Report Status 08/04/2019 FINAL  Final  Culture, respiratory     Status: None (Preliminary result)   Collection Time: 08/01/19  3:30 PM   Specimen: Tracheal Aspirate  Result Value Ref Range Status   Specimen Description   Final    TRACHEAL ASPIRATE Performed at [facility] [address], [place], [place] [address]    Special Requests   Final    NONE Performed at [facility], [place] [address], [place], [place] [address]    Gram Stain   Final    RARE WBC PRESENT, PREDOMINANTLY PMN ABUNDANT GRAM POSITIVE COCCI IN CLUSTERS    Culture   Final    MODERATE STAPHYLOCOCCUS AUREUS SUSCEPTIBILITIES TO FOLLOW Performed at [facility], [place] [address] [place], [place] [address]    Report Status PENDING  Incomplete  Culture, Urine     Status: Abnormal   Collection Time: 08/01/19  5:45 PM    Specimen: Urine, Catheterized  Result Value Ref Range Status   Specimen Description   Final    URINE, CATHETERIZED Performed at [facility] [address], [place], [place] [address]    Special Requests   Final    NONE Performed at [facility], [place] [address] [place], [place] [address]    Culture (A)  Final    <10,000 COLONIES/mL INSIGNIFICANT GROWTH Performed at [facility] [address] [place], [place] [address]    Report Status 08/03/2019 FINAL  Final  MRSA PCR Screening     Status: None   Collection Time: 08/02/19 10:02 AM   Specimen: Nasal Mucosa; Nasopharyngeal  Result Value Ref Range Status   MRSA by PCR NEGATIVE NEGATIVE Final    Comment:        The GeneXpert MRSA Assay (FDA approved for NASAL specimens only), is one component of a comprehensive MRSA colonization surveillance program. It is not intended to diagnose MRSA infection nor to guide or monitor treatment for MRSA infections. Performed at [facility], [place] [address], [place],  [address]     Impression/Plan:  1. MSSA bacteremia - line out.  Will repeat blood cultures.    2. Leukocytosis - worsened.  From #1 above.

## 2019-08-04 NOTE — Progress Notes (Signed)
Called patient's wife to provide an update. Answered all questions.

## 2019-08-04 NOTE — Progress Notes (Signed)
Temp 101.6 and HR in the 130s. Johney Maine, MD. Orders for suppository Tylenol.

## 2019-08-04 NOTE — Progress Notes (Signed)
PROGRESS NOTE    [name]  PYK:[id number] DOB: 03-Jun-1951 DOA: 07/25/2019 PCP: Kathyrn Drown, MD   Brief Narrative:  68 year old BM PMHx GERD, BPH  Presented to [facility] ER with shortness of breath and hypoxia.  Patient was diagnosed with COVID 19 on July 13 9019.  Patient's wife also has COVID-19 but is asymptomatic.  Over the last 1 to 2 days, the patient's shortness of breath is increased.  Patient has been suffering with anorexia.  Patient's wife was actually having a conversation via teleconference with the patient's PCP.  PCP is also otherwise physician.  He had actually called to check on how the wife was doing.  The wife had mentioned to the physician that the patient has been laying in bed.  When the physician listened to how the patient was speaking over the phone, it was noted the patient was tachypneic.  EMS was activated.  When EMS arrived, the patient's O2 saturations were apparently in the 40 percentile range.  Patient was placed on nonrebreather.  He was transferred to the ER.  In the ER, his oxygen saturations on 100% nonrebreather were only in the 80s.  Patient was confused.  Patient was emergently intubated.  Patient given IV steroids.  Initial chest x-ray demonstrates diffuse multifocal infiltrates.  CT scan of the chest was negative for PE.  ABG prior to leaving for [place] demonstrates a pH 7.41 PCO2 44 PO2 of 87 on FiO2 100%.  AA gradient calculates out to 571. In ED patient was emergently intubated. Central line placed. Started on IV Decadron.  Subjective: Patient continues to have dark NG tube output, patient continues to be intubated on a ventilator, tolerating sedation quite well.  Unable to further obtain review of systems  Assessment & Plan:   Principal Problem:   Pneumonia due to COVID-19 virus Active Problems:   BPH (benign prostatic hyperplasia)   Acute respiratory failure due to COVID-19 ([place])   AKI (acute kidney injury) (HCC)   Fluid  overload   COVID pneumonia/acute respiratory stress with hypoxia, ongoing Sepsis secondary to MSSA bacteremia Vent Mode: PCV FiO2 (%):  [40 %-100 %] 100 % Set Rate:  [15 bmp] 15 bmp PEEP:  [10 cmH20] 10 cmH20 Plateau Pressure:  [21 cmH20-28 cmH20] 26 cmH20 - Attempting to wean vent/sedation daily per PCCM - Continue Decadron -completed 10/12 - Remdesivir Completed - 10/2 Actemra x1 dose - CRP improving appropriately - 10/3 transfused 1 unit convalescent plasma - Continue incentive spirometry, shutter valve as patient tolerates - no proning today - CT PE negative at admission; DVT BLE negative - d-dimer downtrending appropriately - 10/6 PCXR generally improving from previous - 10/10 PCXR poor inspiratory effort but appears generally the same diffuse airspace disease - Elevated procal; febrile on 10/09; WBC remains elevated - currently 26 - 10/10 MSSA bacteremia noted on labs, discontinue vancomycin/cefepime, initiate cefazolin per ID - 10/11 personally reviewed x-ray appears to be resolving, left lower lobe infiltrate markedly improved, right lower lobe infiltrate appears stable, ET and NG tubes appear in adequate position Recent Labs    08/02/19 0457 08/03/19 1035 08/04/19 0535  DDIMER 10.19* 3.91* 5.44*  FERRITIN 897* 623* 704*  CRP 2.6* 1.1* 0.8    Questionable GI bleed, ongoing Questionable ileus, obstruction ruled out -Patient unable to tolerate tube feeds previously -Ongoing dark NG output and black stool noted - FOBT pending; Flush NG to re-evaluate output -Anticoagulation on hold -Transition PPI gtt to BID IV -H/H stable over the past  48h -Flat plate imaging of abdomen unremarkable   Volume overload, improving -Diuresis on hold given increased creatinine - now improving Intake/Output Summary (Last 24 hours) at 08/04/2019 1021 Last data filed at 08/04/2019 1000 Gross per 24 hour  Intake 1291.6 ml  Output 2660 ml  Net -1368.4 ml    Acute kidney injury, in the  setting of diuresis, ongoing - Trending upwards in the setting of lasix - continue to follow - Furosemide now on hold as above Recent Labs  Lab 07/31/19 0500 08/01/19 0500 08/02/19 0457 08/03/19 1035 08/04/19 0535  CREATININE 1.36* 1.96* 1.54* 1.61* 2.09*    Questionable history of BPH -Patient does not appear to be on any medications at home; unclear diagnosis for BPH -Follow closely for urinary retention - likely remove foley once extubated and ambulatory   DVT prophylaxis: Lovenox held as of 10/11 due to ?GI bleed as above Code Status: Full Family communication: Called and discussed case with [name] (spouse)  Disposition Plan: TBD, pending clinical improvement  Consultants:  PCCM  Procedures/Significant Events:  10/2 CTA chest - Negative for PE 10/2 PCXR - Endotracheal tube remains with tip above the thoracic inlet. Recommend repositioning and advancement. Esophagogastric tube with tip and side port below the diaphragm. Unchanged diffuse, extensive bilateral heterogeneous opacity, consistent with multifocal infection, edema, and/or ARDS 10/3 PCXR - Slightly improved extensive bilateral heterogeneous opacifications (my read) 10/6 PCXR - Generally improving aeration/diminished opacifications (R>L) 10/10 PCXR - Personally reviewed Poor inspiration - mild improvement in aeration 10/11 PCXR - Personally reviewed; Left lower lobe appears to be improving; RLL stable  I have personally reviewed and interpreted all radiology studies and my findings are as above.  Cultures 09/29 Novel coronavirus positive 10/02 blood LEFT antecubital NGTD 10/02 blood hand NGTD 10/03 MRSA by PCR negative 10/09 Blood culture x2 + MSSA 10/09 Urine culture <10k colonies 10/12 Blood cultures pending 10/13 Blood cultures pending  Antimicrobials: Anti-infectives (From admission, onward)   Start     Stop   07/26/19 1600  remdesivir 100 mg in sodium chloride 0.9 % 250 mL IVPB     07/30/19 1559    07/26/19 0215  remdesivir 200 mg in sodium chloride 0.9 % 250 mL IVPB     07/26/19 0520     LINES / TUBES:  #7.5 ETT 10/2>> ongoing RIGHT IJ CVL removed 08/02/19  Continuous Infusions:  sodium chloride Stopped (08/01/19 0957)    ceFAZolin (ANCEF) IV Stopped (08/04/19 0705)   feeding supplement (VITAL 1.5 CAL) 1,000 mL (08/02/19 1030)   fentaNYL infusion INTRAVENOUS 100 mcg/hr (08/04/19 1000)   midazolam 1 mg/hr (08/04/19 1000)   norepinephrine (LEVOPHED) Adult infusion Stopped (07/28/19 2137)   pantoprozole (PROTONIX) infusion 8 mg/hr (08/04/19 1000)   Objective: Vitals:   08/04/19 0700 08/04/19 0800 08/04/19 0900 08/04/19 1000  BP: 126/86 128/78 120/75 118/79  Pulse: (!) 119 (!) 121 (!) 116 (!) 117  Resp: '16 20 16 16  '$ Temp:  100.2 F (37.9 C)    TempSrc:  Oral    SpO2: 92% (!) 89% 95% 94%  Weight:      Height:        Intake/Output Summary (Last 24 hours) at 08/04/2019 1021 Last data filed at 08/04/2019 1000 Gross per 24 hour  Intake 1291.6 ml  Output 2660 ml  Net -1368.4 ml   Filed Weights   08/01/19 0500 08/02/19 0500 08/03/19 0500  Weight: 92.7 kg 92.8 kg 90 kg   Physical Exam:  General: Sedated/intubated, prone, no acute distress  Eyes: negative scleral hemorrhage, negative anisocoria, negative icterus ENT: #7.5 ETT tube in place; NG outputting dark/maroon fluid to intermittent suction Neck:  Negative scars, masses, torticollis, lymphadenopathy, JVD, RIGHT IJ CVL in place Lungs: diminished bilaterally without overt wheezes or rales Cardiovascular: Bradycardic, without murmur gallop or rub normal S1 and S2 Abdomen: negative abdominal pain, nondistended, hypoactive bowel sounds, no rebound, no ascites, no appreciable mass Extremities: No significant cyanosis, clubbing Skin: Negative rashes, lesions, ulcers  Data Reviewed: Care during the described time interval was provided by me .  I have reviewed this patient's available data, including medical history,  events of note, physical examination, and all test results as part of my evaluation.  CBC: Recent Labs  Lab 07/29/19 0450  07/30/19 0451  08/01/19 0500  08/02/19 0457 08/03/19 0356 08/03/19 1035 08/03/19 1640 08/04/19 0444 08/04/19 0535  WBC 10.7*  --  9.9   < > 19.2*  --  18.5*  --  18.1* 20.3*  --  26.3*  NEUTROABS 9.7*  --  8.6*  --   --   --   --   --   --   --   --   --   HGB 11.3*   < > 12.9*   < > 12.5*   < > 10.5* 10.9* 11.0* 11.3* 11.9* 11.3*  HCT 37.1*   < > 41.9   < > 40.5   < > 34.1* 32.0* 35.2* 35.6* 35.0* 36.5*  MCV 100.3*  --  98.4   < > 98.1  --  98.3  --  98.1 97.8  --  99.5  PLT 415*  --  411*   < > 356  --  281  --  300 289  --  373   < > = values in this interval not displayed.   Basic Metabolic Panel: Recent Labs  Lab 07/29/19 0450  07/30/19 0451  07/31/19 0500  08/01/19 0500  08/02/19 0457 08/03/19 0356 08/03/19 1035 08/04/19 0444 08/04/19 0535  NA 152*   < > 149*   < > 147*   < > 146*   < > 145 142 144 146* 148*  K 4.9   < > 4.8   < > 4.9   < > 4.1   < > 4.3 4.4 4.8 5.0 5.0  CL 115*  --  105  --  107  --  93*  --  99  --  98  --  100  CO2 28  --  31  --  31  --  35*  --  35*  --  35*  --  34*  GLUCOSE 151*  --  138*  --  149*  --  144*  --  157*  --  134*  --  130*  BUN 50*  --  53*  --  55*  --  77*  --  74*  --  99*  --  104*  CREATININE 1.27*  --  1.43*  --  1.36*  --  1.96*  --  1.54*  --  1.61*  --  2.09*  CALCIUM 8.6*  --  9.0  --  9.0  --  9.2  --  9.4  --  9.4  --  9.2  MG 2.9*  --  2.7*  --   --   --  2.6*  --  3.1*  --   --   --   --   PHOS 2.8  --  5.5*  --   --   --  5.7*  --  3.8  --   --   --   --    < > = values in this interval not displayed.   GFR: Estimated Creatinine Clearance: 39.2 mL/min (A) (by C-G formula based on SCr of 2.09 mg/dL (H)).  Liver Function Tests: Recent Labs  Lab 07/31/19 0500 08/01/19 0500 08/02/19 0457 08/03/19 1035 08/04/19 0535  AST 52* 56* '31 21 27  '$ ALT 53* 59* 38 28 22  ALKPHOS 69 85 91 94 121   BILITOT 0.4 0.7 0.7 0.7 0.5  PROT 6.1* 6.3* 6.5 6.5 6.8  ALBUMIN 2.7* 2.9* 3.7 3.5 3.5   CBG: Recent Labs  Lab 08/03/19 1213 08/03/19 1946 08/04/19 0025 08/04/19 0332 08/04/19 0730  GLUCAP 155* 142* 140* 153* 151*   Urine analysis:    Component Value Date/Time   COLORURINE YELLOW 08/01/2019 1745   APPEARANCEUR HAZY (A) 08/01/2019 1745   LABSPEC 1.023 08/01/2019 1745   PHURINE 5.0 08/01/2019 [place] 08/01/2019 1745   HGBUR NEGATIVE 08/01/2019 1745   BILIRUBINUR NEGATIVE 08/01/2019 1745   KETONESUR NEGATIVE 08/01/2019 1745   PROTEINUR NEGATIVE 08/01/2019 1745   NITRITE NEGATIVE 08/01/2019 1745   LEUKOCYTESUR NEGATIVE 08/01/2019 1745    Recent Results (from the past 240 hour(s))  Blood Culture (routine x 2)     Status: None   Collection Time: 07/25/19  4:56 PM   Specimen: Left Antecubital; Blood  Result Value Ref Range Status   Specimen Description LEFT ANTECUBITAL  Final   Special Requests   Final    BOTTLES DRAWN AEROBIC AND ANAEROBIC Blood Culture results may not be optimal due to an excessive volume of blood received in culture bottles   Culture   Final    NO GROWTH 5 DAYS Performed at [facility], [address] [place], [place] [address]    Report Status 07/30/2019 FINAL  Final  Blood Culture (routine x 2)     Status: None   Collection Time: 07/25/19  5:01 PM   Specimen: BLOOD RIGHT HAND  Result Value Ref Range Status   Specimen Description BLOOD RIGHT HAND  Final   Special Requests   Final    BOTTLES DRAWN AEROBIC AND ANAEROBIC Blood Culture adequate volume   Culture   Final    NO GROWTH 5 DAYS Performed at [facility], [address] [place], [place] [address]    Report Status 07/30/2019 FINAL  Final  MRSA PCR Screening     Status: None   Collection Time: 07/26/19  2:09 AM   Specimen: Nasal Mucosa; Nasopharyngeal  Result Value Ref Range Status   MRSA by PCR NEGATIVE NEGATIVE Final    Comment:        The GeneXpert MRSA Assay  (FDA approved for NASAL specimens only), is one component of a comprehensive MRSA colonization surveillance program. It is not intended to diagnose MRSA infection nor to guide or monitor treatment for MRSA infections. Performed at [facility], [place] [address] [place], [place] [address]   Culture, blood (Routine X 2) w Reflex to ID Panel     Status: Abnormal   Collection Time: 08/01/19 12:15 PM   Specimen: BLOOD  Result Value Ref Range Status   Specimen Description   Final    BLOOD RIGHT ARM Performed at [place] [address] [place],  [address]    Special Requests   Final    BOTTLES DRAWN AEROBIC AND ANAEROBIC Blood Culture adequate volume Performed  at [facility], [place] [address], [place], [place] [address]    Culture  Setup Time   Final    GRAM POSITIVE COCCI IN CLUSTERS IN BOTH AEROBIC AND ANAEROBIC BOTTLES CRITICAL RESULT CALLED TO, READ BACK BY AND VERIFIED WITH: C. SHADE PHARMD, AT 3545 08/02/19 BY [place] Performed at [facility], [place] [address] [place], [place] [address]    Culture STAPHYLOCOCCUS AUREUS (A)  Final   Report Status 08/04/2019 FINAL  Final   Organism ID, Bacteria STAPHYLOCOCCUS AUREUS  Final      Susceptibility   Staphylococcus aureus - MIC*    CIPROFLOXACIN <=0.5 SENSITIVE Sensitive     ERYTHROMYCIN <=0.25 SENSITIVE Sensitive     GENTAMICIN <=0.5 SENSITIVE Sensitive     OXACILLIN 0.5 SENSITIVE Sensitive     TETRACYCLINE <=1 SENSITIVE Sensitive     VANCOMYCIN 1 SENSITIVE Sensitive     TRIMETH/SULFA <=10 SENSITIVE Sensitive     CLINDAMYCIN <=0.25 SENSITIVE Sensitive     RIFAMPIN <=0.5 SENSITIVE Sensitive     Inducible Clindamycin NEGATIVE Sensitive     * STAPHYLOCOCCUS AUREUS  Blood Culture ID Panel (Reflexed)     Status: Abnormal   Collection Time: 08/01/19 12:15 PM  Result Value Ref Range Status   Enterococcus species NOT DETECTED NOT DETECTED Final   Listeria  monocytogenes NOT DETECTED NOT DETECTED Final   Staphylococcus species DETECTED (A) NOT DETECTED Final    Comment: CRITICAL RESULT CALLED TO, READ BACK BY AND VERIFIED WITH: C. SHADE PHARMD, AT 8937 08/02/19 BY D. VANHOOK    Staphylococcus aureus (BCID) DETECTED (A) NOT DETECTED Final    Comment: Methicillin (oxacillin) susceptible Staphylococcus aureus (MSSA). Preferred therapy is anti staphylococcal beta lactam antibiotic (Cefazolin or Nafcillin), unless clinically contraindicated. CRITICAL RESULT CALLED TO, READ BACK BY AND VERIFIED WITH: C. SHADE PHARMD, AT 3428 08/02/19 BY D. VANHOOK    Methicillin resistance NOT DETECTED NOT DETECTED Final   Streptococcus species NOT DETECTED NOT DETECTED Final   Streptococcus agalactiae NOT DETECTED NOT DETECTED Final   Streptococcus pneumoniae NOT DETECTED NOT DETECTED Final   Streptococcus pyogenes NOT DETECTED NOT DETECTED Final   Acinetobacter baumannii NOT DETECTED NOT DETECTED Final   Enterobacteriaceae species NOT DETECTED NOT DETECTED Final   Enterobacter cloacae complex NOT DETECTED NOT DETECTED Final   Escherichia coli NOT DETECTED NOT DETECTED Final   Klebsiella oxytoca NOT DETECTED NOT DETECTED Final   Klebsiella pneumoniae NOT DETECTED NOT DETECTED Final   Proteus species NOT DETECTED NOT DETECTED Final   Serratia marcescens NOT DETECTED NOT DETECTED Final   Haemophilus influenzae NOT DETECTED NOT DETECTED Final   Neisseria meningitidis NOT DETECTED NOT DETECTED Final   Pseudomonas aeruginosa NOT DETECTED NOT DETECTED Final   Candida albicans NOT DETECTED NOT DETECTED Final   Candida glabrata NOT DETECTED NOT DETECTED Final   Candida krusei NOT DETECTED NOT DETECTED Final   Candida parapsilosis NOT DETECTED NOT DETECTED Final   Candida tropicalis NOT DETECTED NOT DETECTED Final    Comment: Performed at [facility], [place] [address], [place], [place] [address]  Culture, blood (Routine X 2) w Reflex to ID Panel     Status:  Abnormal   Collection Time: 08/01/19 12:21 PM   Specimen: BLOOD  Result Value Ref Range Status   Specimen Description   Final    BLOOD RIGHT HAND Performed at [place] [address] [place], [place] [address]    Special Requests   Final  BOTTLES DRAWN AEROBIC AND ANAEROBIC Blood Culture adequate volume Performed at [place] [address], [place], [place] [address]    Culture  Setup Time   Final    GRAM POSITIVE COCCI IN CLUSTERS IN BOTH AEROBIC AND ANAEROBIC BOTTLES CRITICAL RESULT CALLED TO, READ BACK BY AND VERIFIED WITH: C. SHADE PHARMD, AT 5183 08/02/19 BY D. VANHOOK    Culture (A)  Final    STAPHYLOCOCCUS AUREUS SUSCEPTIBILITIES PERFORMED ON PREVIOUS CULTURE WITHIN THE LAST 5 DAYS. Performed at [facility], [place] [address] [place], [place] [address]    Report Status 08/04/2019 FINAL  Final  Culture, respiratory     Status: None (Preliminary result)   Collection Time: 08/01/19  3:30 PM   Specimen: Tracheal Aspirate  Result Value Ref Range Status   Specimen Description   Final    TRACHEAL ASPIRATE Performed at [place] [address], [place], [place] [address]    Special Requests   Final    NONE Performed at [facility], [place] [address] [place], [place] [address]    Gram Stain   Final    RARE WBC PRESENT, PREDOMINANTLY PMN ABUNDANT GRAM POSITIVE COCCI IN CLUSTERS    Culture   Final    MODERATE STAPHYLOCOCCUS AUREUS SUSCEPTIBILITIES TO FOLLOW Performed at [facility], [place] [address] [place], [place] [address]    Report Status PENDING  Incomplete  Culture, Urine     Status: Abnormal   Collection Time: 08/01/19  5:45 PM   Specimen: Urine, Catheterized  Result Value Ref Range Status   Specimen Description   Final    URINE, CATHETERIZED Performed at [place] [address] [place], [place] [address]    Special Requests   Final     NONE Performed at [facility], [place] [address], [place], [place] [address]    Culture (A)  Final    <10,000 COLONIES/mL INSIGNIFICANT GROWTH Performed at [place] [address], [place], [place] [address]    Report Status 08/03/2019 FINAL  Final  MRSA PCR Screening     Status: None   Collection Time: 08/02/19 10:02 AM   Specimen: Nasal Mucosa; Nasopharyngeal  Result Value Ref Range Status   MRSA by PCR NEGATIVE NEGATIVE Final    Comment:        The GeneXpert MRSA Assay (FDA approved for NASAL specimens only), is one component of a comprehensive MRSA colonization surveillance program. It is not intended to diagnose MRSA infection nor to guide or monitor treatment for MRSA infections. Performed at [facility], [place] [address] [place], [place] [address]      Radiology Studies: Dg Chest Port 1 View  Result Date: 08/03/2019 CLINICAL DATA:  Encounter for hypoxia EXAM: PORTABLE CHEST 1 VIEW COMPARISON:  Chest radiograph 08/02/2019 FINDINGS: Stable cardiomediastinal contours. Interval removal of a right central venous catheter, otherwise stable support apparatus. There is mildly improved aeration of the left lung. Persistent consolidative airspace opacities in the right lower lobe. No pneumothorax or large pleural effusion. No acute finding in the visualized skeleton. IMPRESSION: Mildly improved aeration of the left lung. Persistent consolidative opacities at the right lung base concerning for ongoing infection. Electronically Signed   By: Audie Pinto M.D.   On: 08/03/2019 17:04   Dg Abd Portable 1v  Result Date: 08/02/2019 CLINICAL DATA:  Encounter for nausea and vomiting, thick brown gastric secretions EXAM: PORTABLE ABDOMEN -  1 VIEW COMPARISON:  Abdominal radiograph 07/26/2019 FINDINGS: Mild gaseous distention of the colon. No dilated loops of small bowel to suggest obstruction. No evidence for free air on supine view.  Nasogastric tube side port projects over the stomach. No unexpected radiopaque foreign bodies. No acute finding in the visualized skeleton. IMPRESSION: Nonobstructive bowel gas pattern. Electronically Signed   By: Audie Pinto M.D.   On: 08/02/2019 11:34    Scheduled Meds:  sodium chloride   Intravenous Once   chlorhexidine gluconate (MEDLINE KIT)  15 mL Mouth Rinse BID   Chlorhexidine Gluconate Cloth  6 each Topical Daily   feeding supplement (PRO-STAT SUGAR FREE 64)  30 mL Per Tube TID   free water  250 mL Per Tube Q4H   insulin aspart  0-9 Units Subcutaneous Q4H   mouth rinse  15 mL Mouth Rinse 10 times per day   oxyCODONE  5 mg Oral Q6H   [START ON 08/06/2019] pantoprazole  40 mg Intravenous Q12H   polyethylene glycol  17 g Per Tube BID   QUEtiapine  25 mg Oral BID   sennosides  5 mL Per Tube BID   tamsulosin  0.4 mg Oral BID   Continuous Infusions:  sodium chloride Stopped (08/01/19 0957)    ceFAZolin (ANCEF) IV Stopped (08/04/19 0705)   feeding supplement (VITAL 1.5 CAL) 1,000 mL (08/02/19 1030)   fentaNYL infusion INTRAVENOUS 100 mcg/hr (08/04/19 1000)   midazolam 1 mg/hr (08/04/19 1000)   norepinephrine (LEVOPHED) Adult infusion Stopped (07/28/19 2137)   pantoprozole (PROTONIX) infusion 8 mg/hr (08/04/19 1000)    LOS: 10 days   The patient is critically ill with multiple organ systems failure and requires high complexity decision making for assessment and support, frequent evaluation and titration of therapies, application of advanced monitoring technologies and extensive interpretation of multiple databases. Critical Care Time devoted to patient care services described in this note.   Time spent: 42 minutes  Little Ishikawa, DO [facility] Pager [phone]  If 7PM-7AM, please contact night-coverage [email or web address] Password [email or web address] 08/04/2019, 10:21 AM

## 2019-08-05 ENCOUNTER — Inpatient Hospital Stay (HOSPITAL_COMMUNITY): Payer: Medicare HMO

## 2019-08-05 DIAGNOSIS — J9600 Acute respiratory failure, unspecified whether with hypoxia or hypercapnia: Secondary | ICD-10-CM | POA: Diagnosis not present

## 2019-08-05 DIAGNOSIS — E8770 Fluid overload, unspecified: Secondary | ICD-10-CM | POA: Diagnosis not present

## 2019-08-05 DIAGNOSIS — Z978 Presence of other specified devices: Secondary | ICD-10-CM

## 2019-08-05 DIAGNOSIS — U071 COVID-19: Secondary | ICD-10-CM | POA: Diagnosis not present

## 2019-08-05 DIAGNOSIS — Z4682 Encounter for fitting and adjustment of non-vascular catheter: Secondary | ICD-10-CM | POA: Diagnosis not present

## 2019-08-05 DIAGNOSIS — J939 Pneumothorax, unspecified: Secondary | ICD-10-CM | POA: Diagnosis not present

## 2019-08-05 DIAGNOSIS — J9811 Atelectasis: Secondary | ICD-10-CM | POA: Diagnosis not present

## 2019-08-05 DIAGNOSIS — Z9911 Dependence on respirator [ventilator] status: Secondary | ICD-10-CM | POA: Diagnosis not present

## 2019-08-05 DIAGNOSIS — Z452 Encounter for adjustment and management of vascular access device: Secondary | ICD-10-CM | POA: Diagnosis not present

## 2019-08-05 DIAGNOSIS — J1289 Other viral pneumonia: Secondary | ICD-10-CM | POA: Diagnosis not present

## 2019-08-05 DIAGNOSIS — N179 Acute kidney failure, unspecified: Secondary | ICD-10-CM | POA: Diagnosis not present

## 2019-08-05 DIAGNOSIS — J930 Spontaneous tension pneumothorax: Secondary | ICD-10-CM | POA: Diagnosis not present

## 2019-08-05 LAB — C-REACTIVE PROTEIN: CRP: <0.8 mg/dL (ref <1.0)

## 2019-08-05 LAB — POCT I-STAT 7, (LYTES, BLD GAS, ICA,H+H)
Acid-Base Excess: 6 mmol/L — ABNORMAL HIGH (ref 0.0–2.0)
Acid-Base Excess: 8 mmol/L — ABNORMAL HIGH (ref 0.0–2.0)
Bicarbonate: 31.4 mmol/L — ABNORMAL HIGH (ref 20.0–28.0)
Bicarbonate: 34.3 mmol/L — ABNORMAL HIGH (ref 20.0–28.0)
Calcium, Ion: 1.15 mmol/L (ref 1.15–1.40)
Calcium, Ion: 1.19 mmol/L (ref 1.15–1.40)
HCT: 37 % — ABNORMAL LOW (ref 39.0–52.0)
HCT: 34 % — ABNORMAL LOW (ref 39.0–52.0)
Hemoglobin: 12.6 g/dL — ABNORMAL LOW (ref 13.0–17.0)
Hemoglobin: 11.6 g/dL — ABNORMAL LOW (ref 13.0–17.0)
O2 Saturation: 87 %
O2 Saturation: 93 %
Patient temperature: 98.8
Patient temperature: 37.9
Potassium: 4.5 mmol/L (ref 3.5–5.1)
Potassium: 4.4 mmol/L (ref 3.5–5.1)
Sodium: 146 mmol/L — ABNORMAL HIGH (ref 135–145)
Sodium: 148 mmol/L — ABNORMAL HIGH (ref 135–145)
TCO2: 33 mmol/L — ABNORMAL HIGH (ref 22–32)
TCO2: 36 mmol/L — ABNORMAL HIGH (ref 22–32)
pCO2 arterial: 49.3 mmHg — ABNORMAL HIGH (ref 32.0–48.0)
pCO2 arterial: 56.1 mmHg — ABNORMAL HIGH (ref 32.0–48.0)
pH, Arterial: 7.412 (ref 7.350–7.450)
pH, Arterial: 7.398 (ref 7.350–7.450)
pO2, Arterial: 54 mmHg — ABNORMAL LOW (ref 83.0–108.0)
pO2, Arterial: 71 mmHg — ABNORMAL LOW (ref 83.0–108.0)

## 2019-08-05 LAB — CBC
HCT: 38.7 % — ABNORMAL LOW (ref 39.0–52.0)
Hemoglobin: 11.9 g/dL — ABNORMAL LOW (ref 13.0–17.0)
MCH: 30.7 pg (ref 26.0–34.0)
MCHC: 30.7 g/dL (ref 30.0–36.0)
MCV: 99.7 fL (ref 80.0–100.0)
Platelets: 395 10*3/uL (ref 150–400)
RBC: 3.88 MIL/uL — ABNORMAL LOW (ref 4.22–5.81)
RDW: 15.2 % (ref 11.5–15.5)
WBC: 31.9 10*3/uL — ABNORMAL HIGH (ref 4.0–10.5)
nRBC: 0.7 % — ABNORMAL HIGH (ref 0.0–0.2)

## 2019-08-05 LAB — GLUCOSE, CAPILLARY
Glucose-Capillary: 188 mg/dL — ABNORMAL HIGH (ref 70–99)
Glucose-Capillary: 192 mg/dL — ABNORMAL HIGH (ref 70–99)
Glucose-Capillary: 194 mg/dL — ABNORMAL HIGH (ref 70–99)
Glucose-Capillary: 196 mg/dL — ABNORMAL HIGH (ref 70–99)
Glucose-Capillary: 210 mg/dL — ABNORMAL HIGH (ref 70–99)
Glucose-Capillary: 244 mg/dL — ABNORMAL HIGH (ref 70–99)

## 2019-08-05 LAB — COMPREHENSIVE METABOLIC PANEL
ALT: 19 U/L (ref 0–44)
AST: 31 U/L (ref 15–41)
Albumin: 3.5 g/dL (ref 3.5–5.0)
Alkaline Phosphatase: 104 U/L (ref 38–126)
Anion gap: 17 — ABNORMAL HIGH (ref 5–15)
BUN: 129 mg/dL — ABNORMAL HIGH (ref 8–23)
CO2: 29 mmol/L (ref 22–32)
Calcium: 8.8 mg/dL — ABNORMAL LOW (ref 8.9–10.3)
Chloride: 103 mmol/L (ref 98–111)
Creatinine, Ser: 2.78 mg/dL — ABNORMAL HIGH (ref 0.61–1.24)
GFR calc Af Amer: 26 mL/min — ABNORMAL LOW (ref >60)
GFR calc non Af Amer: 23 mL/min — ABNORMAL LOW (ref >60)
Glucose, Bld: 186 mg/dL — ABNORMAL HIGH (ref 70–99)
Potassium: 4.3 mmol/L (ref 3.5–5.1)
Sodium: 149 mmol/L — ABNORMAL HIGH (ref 135–145)
Total Bilirubin: 0.6 mg/dL (ref 0.3–1.2)
Total Protein: 6.8 g/dL (ref 6.5–8.1)

## 2019-08-05 LAB — D-DIMER, QUANTITATIVE: D-Dimer, Quant: 6.58 ug/mL-FEU — ABNORMAL HIGH (ref 0.00–0.50)

## 2019-08-05 LAB — FERRITIN: Ferritin: 1072 ng/mL — ABNORMAL HIGH (ref 24–336)

## 2019-08-05 MED ORDER — FREE WATER
125.0000 mL | Status: DC
  Administered 2019-08-05 – 2019-08-10 (×29): 125 mL

## 2019-08-05 MED ORDER — CHLORHEXIDINE GLUCONATE 0.12 % MT SOLN
15.0000 mL | Freq: Two times a day (BID) | OROMUCOSAL | Status: DC
  Administered 2019-08-05 – 2019-10-03 (×113): 15 mL via OROMUCOSAL
  Filled 2019-08-05 (×69): qty 15

## 2019-08-05 MED ORDER — FREE WATER: 250.0000 mL | Status: DC

## 2019-08-05 MED ORDER — ACETAMINOPHEN 160 MG/5ML PO SOLN
650.0000 mg | ORAL | Status: DC | PRN
  Administered 2019-08-05 – 2019-09-22 (×12): 650 mg
  Filled 2019-08-05 (×14): qty 20.3

## 2019-08-05 MED ORDER — ALBUMIN HUMAN 25 % IV SOLN
25.0000 g | Freq: Once | INTRAVENOUS | Status: AC
  Administered 2019-08-05: 25 g via INTRAVENOUS
  Filled 2019-08-05: qty 100

## 2019-08-05 MED ORDER — VITAL 1.5 CAL PO LIQD
1000.0000 mL | ORAL | Status: DC
  Administered 2019-08-05: 1000 mL
  Filled 2019-08-05: qty 1000

## 2019-08-05 MED ORDER — SODIUM CHLORIDE 0.45 % IV BOLUS
1000.0000 mL | Freq: Once | INTRAVENOUS | Status: AC
  Administered 2019-08-05: 1000 mL via INTRAVENOUS

## 2019-08-05 MED ORDER — SODIUM CHLORIDE 0.9 % IV BOLUS: 1000.0000 mL | Freq: Once | INTRAVENOUS | Status: DC

## 2019-08-05 MED ORDER — VASOPRESSIN 20 UNIT/ML IV SOLN
0.0300 [IU]/min | INTRAVENOUS | Status: DC
  Administered 2019-08-05 – 2019-08-10 (×4): 0.03 [IU]/min via INTRAVENOUS
  Filled 2019-08-05 (×8): qty 2

## 2019-08-05 MED ORDER — HEPARIN SODIUM (PORCINE) 10000 UNIT/ML IJ SOLN: 7500.0000 [IU] | Freq: Three times a day (TID) | INTRAMUSCULAR | Status: DC

## 2019-08-05 MED ORDER — HEPARIN SODIUM (PORCINE) 10000 UNIT/ML IJ SOLN
7500.0000 [IU] | Freq: Three times a day (TID) | INTRAMUSCULAR | Status: DC
  Administered 2019-08-05 – 2019-08-06 (×4): 7500 [IU] via SUBCUTANEOUS
  Filled 2019-08-05 (×4): qty 1

## 2019-08-05 NOTE — Progress Notes (Signed)
Verbal consent obtained over the phone by myself and Dr. Mariane Masters for placement of central line. Procedure explained by Dr. Mariane Masters over the phone, all questions answered.

## 2019-08-05 NOTE — Progress Notes (Signed)
After making change to peep, xray was completed and found pneumo.  Elink requested to return peep to 10+.

## 2019-08-05 NOTE — Progress Notes (Signed)
PCCM:  Interval update following chest tube placement.  This note is late on documentation.  The time for this service was 8:50AM following chest tube placement repeat chest x-ray failed to demonstrate reexpansion of the right lung and persistent right pneumothorax.  Upon inspection of the atrium the waterseal container was not appropriately set up.  A new atrium was replaced with waterseal.  Additionally there was a audible and palpable airleak coming out from around the patient's insertion site.  Vaseline gauze was applied and all connections of tubing was secured.  Stat repeat chest x-ray as well as a visible air return into the chamber of the atrium was identified.  Repeat imaging revealed reexpansion of the right lung.  And to the air leak slowly begin to dissipate over time.  This patient is critically ill with multiple organ system failure; which, requires frequent high complexity decision making, troubleshooting support apparatus and chest tube, assessment, support, evaluation, and titration of therapies. This was completed through the application of advanced monitoring technologies and extensive interpretation of multiple databases. During this encounter critical care time was devoted to patient care services described in this note for 32 minutes.  Garner Nash, DO [facility] 08/05/2019 12:01 PM  Personal pager: [phone] If unanswered, please page CCM On-call: [phone]

## 2019-08-05 NOTE — Progress Notes (Signed)
Peep increased d/t desat at 100% FiO2 and 10+ peep to mid to lower 80s.  All VS stable at this time. Will continue to monitor.

## 2019-08-05 NOTE — Progress Notes (Signed)
eLink Physician-Brief Progress Note Patient Name: [name] DOB: 06/01/1951 MRN: [id number]   Date of Service  08/05/2019  HPI/Events of Note  Pt with Covid pneumonia s/p chest tube for pneumothorax. Pt is hypotensive and Norepinephrine is up to 18 mics via a peripheral line.  eICU Interventions  PCCM on the ground dispatched to place a central line for vasopressor infusion access, stat portable CXR to exclude tension pneumothorax, Vasopressin infusion added, Albumin 25 % 25 gm iv x 1 for hypotension.     Intervention Category Intermediate Interventions: Hypotension - evaluation and management  Frederik Pear 08/05/2019, 10:50 PM

## 2019-08-05 NOTE — Progress Notes (Signed)
[facility] for Infectious [facility]   Reason for visit: Follow up on Staph aureus bacteremia  Interval History: tension pneumothorax s/p chest tube placement.  WBC up to 31.9, Tmax 101.9.  Remains intubated.  Creat up.  Day 4 cefazolin  Physical Exam: Not examined Vitals:   08/05/19 0759 08/05/19 0800  BP: 117/67 111/75  Pulse: (!) 109   Resp: (!) 26   Temp:  (!) 100.4 F (38 C)  SpO2: 98%     Lab Results  Component Value Date   WBC 31.9 (H) 08/05/2019   HGB 11.9 (L) 08/05/2019   HCT 38.7 (L) 08/05/2019   MCV 99.7 08/05/2019   PLT 395 08/05/2019    Lab Results  Component Value Date   CREATININE 2.78 (H) 08/05/2019   BUN PENDING 08/05/2019   NA 149 (H) 08/05/2019   K 4.3 08/05/2019   CL 103 08/05/2019   CO2 29 08/05/2019    Lab Results  Component Value Date   ALT 19 08/05/2019   AST 31 08/05/2019   ALKPHOS 104 08/05/2019     Microbiology: Recent Results (from the past 240 hour(s))  Culture, blood (Routine X 2) w Reflex to ID Panel     Status: Abnormal   Collection Time: 08/01/19 12:15 PM   Specimen: BLOOD  Result Value Ref Range Status   Specimen Description   Final    BLOOD RIGHT ARM Performed at [facility], [place] [address], [place], [place] [address]    Special Requests   Final    BOTTLES DRAWN AEROBIC AND ANAEROBIC Blood Culture adequate volume Performed at [facility] [address], [place], [place] [address]    Culture  Setup Time   Final    GRAM POSITIVE COCCI IN CLUSTERS IN BOTH AEROBIC AND ANAEROBIC BOTTLES CRITICAL RESULT CALLED TO, READ BACK BY AND VERIFIED WITH: C. SHADE PHARMD, AT 6948 08/02/19 BY Rush [place]mark Performed at [facility], [place] [address] [place], [place] [address]    Culture STAPHYLOCOCCUS AUREUS (A)  Final   Report Status 08/04/2019 FINAL  Final   Organism ID, Bacteria STAPHYLOCOCCUS AUREUS  Final      Susceptibility   Staphylococcus aureus - MIC*    CIPROFLOXACIN <=0.5  SENSITIVE Sensitive     ERYTHROMYCIN <=0.25 SENSITIVE Sensitive     GENTAMICIN <=0.5 SENSITIVE Sensitive     OXACILLIN 0.5 SENSITIVE Sensitive     TETRACYCLINE <=1 SENSITIVE Sensitive     VANCOMYCIN 1 SENSITIVE Sensitive     TRIMETH/SULFA <=10 SENSITIVE Sensitive     CLINDAMYCIN <=0.25 SENSITIVE Sensitive     RIFAMPIN <=0.5 SENSITIVE Sensitive     Inducible Clindamycin NEGATIVE Sensitive     * STAPHYLOCOCCUS AUREUS  Blood Culture ID Panel (Reflexed)     Status: Abnormal   Collection Time: 08/01/19 12:15 PM  Result Value Ref Range Status   Enterococcus species NOT DETECTED NOT DETECTED Final   Listeria monocytogenes NOT DETECTED NOT DETECTED Final   Staphylococcus species DETECTED (A) NOT DETECTED Final    Comment: CRITICAL RESULT CALLED TO, READ BACK BY AND VERIFIED WITH: C. SHADE PHARMD, AT 0350 08/02/19 BY D. VANHOOK    Staphylococcus aureus (BCID) DETECTED (A) NOT DETECTED Final    Comment: Methicillin (oxacillin) susceptible Staphylococcus aureus (MSSA). Preferred therapy is anti staphylococcal beta lactam antibiotic (Cefazolin or Nafcillin), unless clinically contraindicated. CRITICAL RESULT CALLED TO, READ BACK BY AND VERIFIED WITH: C. Kilbourne, AT 0938 08/02/19 BY D. Victoriano Lain  Methicillin resistance NOT DETECTED NOT DETECTED Final   Streptococcus species NOT DETECTED NOT DETECTED Final   Streptococcus agalactiae NOT DETECTED NOT DETECTED Final   Streptococcus pneumoniae NOT DETECTED NOT DETECTED Final   Streptococcus pyogenes NOT DETECTED NOT DETECTED Final   Acinetobacter baumannii NOT DETECTED NOT DETECTED Final   Enterobacteriaceae species NOT DETECTED NOT DETECTED Final   Enterobacter cloacae complex NOT DETECTED NOT DETECTED Final   Escherichia coli NOT DETECTED NOT DETECTED Final   Klebsiella oxytoca NOT DETECTED NOT DETECTED Final   Klebsiella pneumoniae NOT DETECTED NOT DETECTED Final   Proteus species NOT DETECTED NOT DETECTED Final   Serratia marcescens NOT  DETECTED NOT DETECTED Final   Haemophilus influenzae NOT DETECTED NOT DETECTED Final   Neisseria meningitidis NOT DETECTED NOT DETECTED Final   Pseudomonas aeruginosa NOT DETECTED NOT DETECTED Final   Candida albicans NOT DETECTED NOT DETECTED Final   Candida glabrata NOT DETECTED NOT DETECTED Final   Candida krusei NOT DETECTED NOT DETECTED Final   Candida parapsilosis NOT DETECTED NOT DETECTED Final   Candida tropicalis NOT DETECTED NOT DETECTED Final    Comment: Performed at [facility], [place] [address], [place], [place] [address]  Culture, blood (Routine X 2) w Reflex to ID Panel     Status: Abnormal   Collection Time: 08/01/19 12:21 PM   Specimen: BLOOD  Result Value Ref Range Status   Specimen Description   Final    BLOOD RIGHT HAND Performed at [facility] [address] [place], [place] [address]    Special Requests   Final    BOTTLES DRAWN AEROBIC AND ANAEROBIC Blood Culture adequate volume Performed at [facility] [address], [place], [place] [address]    Culture  Setup Time   Final    GRAM POSITIVE COCCI IN CLUSTERS IN BOTH AEROBIC AND ANAEROBIC BOTTLES CRITICAL RESULT CALLED TO, READ BACK BY AND VERIFIED WITH: C. SHADE PHARMD, AT 0354 08/02/19 BY D. VANHOOK    Culture (A)  Final    STAPHYLOCOCCUS AUREUS SUSCEPTIBILITIES PERFORMED ON PREVIOUS CULTURE WITHIN THE LAST 5 DAYS. Performed at [facility], [place] [address], [place], [place] [address]    Report Status 08/04/2019 FINAL  Final  Culture, respiratory     Status: None (Preliminary result)   Collection Time: 08/01/19  3:30 PM   Specimen: Tracheal Aspirate  Result Value Ref Range Status   Specimen Description   Final    TRACHEAL ASPIRATE Performed at [facility] [address], [place], [place] [address]    Special Requests   Final    NONE Performed at [facility], [place] [address], [place], [place] [address]     Gram Stain   Final    RARE WBC PRESENT, PREDOMINANTLY PMN ABUNDANT GRAM POSITIVE COCCI IN CLUSTERS Performed at [facility], [place] [address] [place], [place] [address]    Culture   Final    MODERATE STAPHYLOCOCCUS AUREUS RARE GRAM NEGATIVE RODS    Report Status PENDING  Incomplete   Organism ID, Bacteria STAPHYLOCOCCUS AUREUS  Final      Susceptibility   Staphylococcus aureus - MIC*    CIPROFLOXACIN <=0.5 SENSITIVE Sensitive     ERYTHROMYCIN <=0.25 SENSITIVE Sensitive     GENTAMICIN <=0.5 SENSITIVE Sensitive     OXACILLIN 0.5 SENSITIVE Sensitive     TETRACYCLINE <=1 SENSITIVE Sensitive     VANCOMYCIN <=0.5 SENSITIVE Sensitive     TRIMETH/SULFA <=  10 SENSITIVE Sensitive     CLINDAMYCIN <=0.25 SENSITIVE Sensitive     RIFAMPIN <=0.5 SENSITIVE Sensitive     Inducible Clindamycin NEGATIVE Sensitive     * MODERATE STAPHYLOCOCCUS AUREUS  Culture, Urine     Status: Abnormal   Collection Time: 08/01/19  5:45 PM   Specimen: Urine, Catheterized  Result Value Ref Range Status   Specimen Description   Final    URINE, CATHETERIZED Performed at [facility], [place] [address] [place], [place] [address]    Special Requests   Final    NONE Performed at [facility], [place] [address], [place], [place] [address]    Culture (A)  Final    <10,000 COLONIES/mL INSIGNIFICANT GROWTH Performed at [facility] [address], [place], [place] [address]    Report Status 08/03/2019 FINAL  Final  MRSA PCR Screening     Status: None   Collection Time: 08/02/19 10:02 AM   Specimen: Nasal Mucosa; Nasopharyngeal  Result Value Ref Range Status   MRSA by PCR NEGATIVE NEGATIVE Final    Comment:        The GeneXpert MRSA Assay (FDA approved for NASAL specimens only), is one component of a comprehensive MRSA colonization surveillance program. It is not intended to diagnose MRSA infection nor to guide or monitor treatment for MRSA infections. Performed  at [facility], [place] [address], [place], [place] [address]     Impression/Plan:  1. MSSA bacteremia - repeat blood cultures sent this am.  Respiratory culture with MSSA as well.   On cefazolin and will continue.    2. Pneumothorax - new and now with CTs.    3.  Leukocytosis - from #1 and likely from the pneumothorax as well.  Will continue to monitor.

## 2019-08-05 NOTE — Progress Notes (Signed)
R sided Pneumothorax observed on Cxray rounds. Provider notified, supplies gathered and pt prepped for CT insertion. VSS at this time.

## 2019-08-05 NOTE — Progress Notes (Signed)
PROGRESS NOTE    [name]  AST:[id number] DOB: 05-01-1951 DOA: 07/25/2019 PCP: Kathyrn Drown, MD   Brief Narrative:  68 year old BM PMHx GERD, BPH  Presented to [facility] ER with shortness of breath and hypoxia.  Patient was diagnosed with COVID 19 on July 13 9019.  Patient's wife also has COVID-19 but is asymptomatic.  Over the last 1 to 2 days, the patient's shortness of breath is increased.  Patient has been suffering with anorexia.  Patient's wife was actually having a conversation via teleconference with the patient's PCP.  PCP is also otherwise physician.  He had actually called to check on how the wife was doing.  The wife had mentioned to the physician that the patient has been laying in bed.  When the physician listened to how the patient was speaking over the phone, it was noted the patient was tachypneic.  EMS was activated.  When EMS arrived, the patient's O2 saturations were apparently in the 40 percentile range.  Patient was placed on nonrebreather.  He was transferred to the ER.  In the ER, his oxygen saturations on 100% nonrebreather were only in the 80s.  Patient was confused.  Patient was emergently intubated.  Patient given IV steroids.  Initial chest x-ray demonstrates diffuse multifocal infiltrates.  CT scan of the chest was negative for PE.  ABG prior to leaving for [place] demonstrates a pH 7.41 PCO2 44 PO2 of 87 on FiO2 100%.  AA gradient calculates out to 571. In ED patient was emergently intubated. Central line placed. Started on IV Decadron.  Subjective: Early morning chest x-ray notable for right-sided pneumothorax, tube placed overnight.  NG tube output now diminishing, no longer appears dark or bloody, patient continues to be intubated on a ventilator, tolerating sedation quite well.  Unable to further obtain review of systems due to mental status  Assessment & Plan:   Principal Problem:   Pneumonia due to COVID-19 virus Active Problems:  BPH (benign prostatic hyperplasia)   Acute respiratory failure due to COVID-19 ([place])   AKI (acute kidney injury) (HCC)   Fluid overload    COVID pneumonia/acute respiratory stress with hypoxia, ongoing Sepsis secondary to MSSA bacteremia Vent Mode: PCV FiO2 (%):  [60 %-100 %] 100 % Set Rate:  [15 bmp] 15 bmp PEEP:  [10 cmH20-12 cmH20] 10 cmH20 Plateau Pressure:  [21 cmH20-29 cmH20] 27 cmH20 - Attempting to wean vent/sedation daily per PCCM - Continue Decadron -completed 10/12 - Remdesivir Completed - 10/2 Actemra x1 dose - CRP improving appropriately - now within normal limits - 10/3 transfused 1 unit convalescent plasma - CT PE negative at admission; DVT BLE negative - D-dimer downtrending appropriately - 10/13 x-ray notable for large right-sided pneumothorax, now status post chest tube placement with reinflation of lung on post tube insertion films. - Chest tube placed overnight - remains intact with high volume leak (questionable minimal leak at insertion site improved with vaseline gauze w/ PCCM) - 10/10 MSSA bacteremia noted on labs, de-escalate from vancomycin/cefepime, initiate cefazolin per ID -rare gram-negative rods noted on sputum, defer to ID for further broadening of antibiotics if appropriate Recent Labs    08/03/19 1035 08/04/19 0535  DDIMER 3.91* 5.44*  FERRITIN 623* 704*  CRP 1.1* 0.8    Questionable GI bleed, resolving Questionable ileus, obstruction ruled out -H/H stable x72h -NG output improving - no longer black/maroon - resume tube feeds/PO meds -Anticoagulation restarted -Transition PPI gtt to BID IV -Flat plate imaging of abdomen unremarkable  Volume overload, improving -Diuresis on hold given increased creatinine - now improving Intake/Output Summary (Last 24 hours) at 08/05/2019 0739 Last data filed at 08/05/2019 0600 Gross per 24 hour  Intake 1595.36 ml  Output 2250 ml  Net -654.64 ml    Acute kidney injury, in the setting of diuresis,  ongoing - Trending upwards given previous lasix, now on hold - continue to follow - Restart tube feeds/free water flushes Recent Labs  Lab 07/31/19 0500 08/01/19 0500 08/02/19 0457 08/03/19 1035 08/04/19 0535  CREATININE 1.36* 1.96* 1.54* 1.61* 2.09*    Questionable history of BPH -Patient does not appear to be on any medications at home; unclear diagnosis for BPH -Follow closely for urinary retention - likely remove foley once extubated and ambulatory   DVT prophylaxis: Resume subq heparin as above Code Status: Full Family communication: Called and discussed case with [name] (spouse)  Disposition Plan: TBD, pending clinical course  Consultants:  PCCM  Procedures/Significant Events:  10/2 CTA chest - Negative for PE 10/2 PCXR - Endotracheal tube remains with tip above the thoracic inlet. Recommend repositioning and advancement. Esophagogastric tube with tip and side port below the diaphragm. Unchanged diffuse, extensive bilateral heterogeneous opacity, consistent with multifocal infection, edema, and/or ARDS 10/3 PCXR - Slightly improved extensive bilateral heterogeneous opacifications (my read) 10/6 PCXR - Generally improving aeration/diminished opacifications (R>L) 10/10 PCXR - Personally reviewed: Poor inspiration - mild improvement in aeration 10/11 PCXR - Personally reviewed: Left lower lobe appears to be improving; RLL stable 10/13 PCXR - Personally reviewed: Large R sided pneumothorax - re-expanded after chest tube placed overnight  I have personally reviewed and interpreted all radiology studies and my findings are as above.  Cultures 09/29 Novel coronavirus positive 10/02 blood LEFT antecubital NGTD 10/02 blood hand NGTD 10/03 MRSA by PCR negative 10/09 Blood culture x2  +MSSA 10/09 Urine culture <10k colonies 10/12 Blood cultures pending 10/13 Blood cultures pending  Antimicrobials: Anti-infectives (From admission, onward)   Start     Stop   07/26/19 1600   remdesivir 100 mg in sodium chloride 0.9 % 250 mL IVPB     07/30/19 1559   07/26/19 0215  remdesivir 200 mg in sodium chloride 0.9 % 250 mL IVPB     07/26/19 0520     LINES / TUBES:  #7.5 ETT 10/2>> ongoing RIGHT IJ CVL removed 08/02/19 R chest tube 10/13 currently to suction with leak  Continuous Infusions: . sodium chloride Stopped (08/01/19 0957)  .  ceFAZolin (ANCEF) IV 2 g (08/05/19 0718)  . feeding supplement (VITAL 1.5 CAL) Stopped (08/04/19 2000)  . fentaNYL infusion INTRAVENOUS 100 mcg/hr (08/04/19 1900)  . midazolam Stopped (08/05/19 0737)  . norepinephrine (LEVOPHED) Adult infusion Stopped (07/28/19 2137)   Objective: Vitals:   08/05/19 0400 08/05/19 0434 08/05/19 0500 08/05/19 0600  BP: 116/74  115/82 103/76  Pulse: (!) 126 (!) 103 (!) 130 (!) 122  Resp: 20 (!) 23 (!) 21 19  Temp: 99.3 F (37.4 C)     TempSrc: Oral     SpO2: (!) 89% (!) 87% (!) 88% 93%  Weight:      Height:        Intake/Output Summary (Last 24 hours) at 08/05/2019 0739 Last data filed at 08/05/2019 0600 Gross per 24 hour  Intake 1595.36 ml  Output 2250 ml  Net -654.64 ml   Filed Weights   08/01/19 0500 08/02/19 0500 08/03/19 0500  Weight: 92.7 kg 92.8 kg 90 kg   Physical Exam:  General: Sedated/intubated,  prone, no acute distress Eyes: negative scleral hemorrhage, negative anisocoria, negative icterus ENT: #7.5 ETT tube in place; NG output brown/clear Neck:  Negative scars, masses, torticollis, lymphadenopathy Lungs: diminished rattle/rales over R lateral lung; Chest tube to suction with air leak Cardiovascular: RRR, without murmur gallop or rub normal S1 and S2 Abdomen: negative abdominal pain, nondistended, hypoactive bowel sounds, no rebound, no ascites, no appreciable mass Extremities: No significant cyanosis, clubbing Skin: Negative rashes, lesions, ulcers  Data Reviewed: Care during the described time interval was provided by me .  I have reviewed this patient's available  data, including medical history, events of note, physical examination, and all test results as part of my evaluation.  CBC: Recent Labs  Lab 07/30/19 0451  08/01/19 0500  08/02/19 0457  08/03/19 1035 08/03/19 1640 08/04/19 0444 08/04/19 0535 08/05/19 0506  WBC 9.9   < > 19.2*  --  18.5*  --  18.1* 20.3*  --  26.3*  --   NEUTROABS 8.6*  --   --   --   --   --   --   --   --   --   --   HGB 12.9*   < > 12.5*   < > 10.5*   < > 11.0* 11.3* 11.9* 11.3* 12.6*  HCT 41.9   < > 40.5   < > 34.1*   < > 35.2* 35.6* 35.0* 36.5* 37.0*  MCV 98.4   < > 98.1  --  98.3  --  98.1 97.8  --  99.5  --   PLT 411*   < > 356  --  281  --  300 289  --  373  --    < > = values in this interval not displayed.   Basic Metabolic Panel: Recent Labs  Lab 07/30/19 0451  07/31/19 0500  08/01/19 0500  08/02/19 0457 08/03/19 0356 08/03/19 1035 08/04/19 0444 08/04/19 0535 08/05/19 0506  NA 149*   < > 147*   < > 146*   < > 145 142 144 146* 148* 146*  K 4.8   < > 4.9   < > 4.1   < > 4.3 4.4 4.8 5.0 5.0 4.5  CL 105  --  107  --  93*  --  99  --  98  --  100  --   CO2 31  --  31  --  35*  --  35*  --  35*  --  34*  --   GLUCOSE 138*  --  149*  --  144*  --  157*  --  134*  --  130*  --   BUN 53*  --  55*  --  77*  --  74*  --  99*  --  104*  --   CREATININE 1.43*  --  1.36*  --  1.96*  --  1.54*  --  1.61*  --  2.09*  --   CALCIUM 9.0  --  9.0  --  9.2  --  9.4  --  9.4  --  9.2  --   MG 2.7*  --   --   --  2.6*  --  3.1*  --   --   --   --   --   PHOS 5.5*  --   --   --  5.7*  --  3.8  --   --   --   --   --    < > =  values in this interval not displayed.   GFR: Estimated Creatinine Clearance: 39.2 mL/min (A) (by C-G formula based on SCr of 2.09 mg/dL (H)).  Liver Function Tests: Recent Labs  Lab 07/31/19 0500 08/01/19 0500 08/02/19 0457 08/03/19 1035 08/04/19 0535  AST 52* 56* _0 ALT 53* 59* 38 28 22  ALKPHOS 69 85 91 94 121  BILITOT 0.4 0.7 0.7 0.7 0.5  PROT 6.1* 6.3* 6.5 6.5 6.8   ALBUMIN 2.7* 2.9* 3.7 3.5 3.5   CBG: Recent Labs  Lab 08/04/19 1126 08/04/19 1622 08/04/19 1926 08/05/19 0005 08/05/19 0418  GLUCAP 164* 175* 207* 210* 196*   Urine analysis:    Component Value Date/Time   COLORURINE YELLOW 08/01/2019 1745   APPEARANCEUR HAZY (A) 08/01/2019 1745   LABSPEC 1.023 08/01/2019 1745   PHURINE 5.0 08/01/2019 1745   GLUCOSEU NEGATIVE 08/01/2019 1745   HGBUR NEGATIVE 08/01/2019 1745   BILIRUBINUR NEGATIVE 08/01/2019 1745   KETONESUR NEGATIVE 08/01/2019 1745   PROTEINUR NEGATIVE 08/01/2019 1745   NITRITE NEGATIVE 08/01/2019 1745   LEUKOCYTESUR NEGATIVE 08/01/2019 1745    Recent Results (from the past 240 hour(s))  Culture, blood (Routine X 2) w Reflex to ID Panel     Status: Abnormal   Collection Time: 08/01/19 12:15 PM   Specimen: BLOOD  Result Value Ref Range Status   Specimen Description   Final    BLOOD RIGHT ARM Performed at [facility], [place] [address] [place], [place] [address]    Special Requests   Final    BOTTLES DRAWN AEROBIC AND ANAEROBIC Blood Culture adequate volume Performed at [facility] [address] [place], [place] [address]    Culture  Setup Time   Final    GRAM POSITIVE COCCI IN CLUSTERS IN BOTH AEROBIC AND ANAEROBIC BOTTLES CRITICAL RESULT CALLED TO, READ BACK BY AND VERIFIED WITH: C. SHADE PHARMD, AT 4239 08/02/19 BY Rush [place]mark Performed at [facility], [place] [address], [place], [place] [address]    Culture STAPHYLOCOCCUS AUREUS (A)  Final   Report Status 08/04/2019 FINAL  Final   Organism ID, Bacteria STAPHYLOCOCCUS AUREUS  Final      Susceptibility   Staphylococcus aureus - MIC*    CIPROFLOXACIN <=0.5 SENSITIVE Sensitive     ERYTHROMYCIN <=0.25 SENSITIVE Sensitive     GENTAMICIN <=0.5 SENSITIVE Sensitive     OXACILLIN 0.5 SENSITIVE Sensitive     TETRACYCLINE <=1 SENSITIVE Sensitive     VANCOMYCIN 1 SENSITIVE Sensitive     TRIMETH/SULFA <=10 SENSITIVE Sensitive      CLINDAMYCIN <=0.25 SENSITIVE Sensitive     RIFAMPIN <=0.5 SENSITIVE Sensitive     Inducible Clindamycin NEGATIVE Sensitive     * STAPHYLOCOCCUS AUREUS  Blood Culture ID Panel (Reflexed)     Status: Abnormal   Collection Time: 08/01/19 12:15 PM  Result Value Ref Range Status   Enterococcus species NOT DETECTED NOT DETECTED Final   Listeria monocytogenes NOT DETECTED NOT DETECTED Final   Staphylococcus species DETECTED (A) NOT DETECTED Final    Comment: CRITICAL RESULT CALLED TO, READ BACK BY AND VERIFIED WITH: C. SHADE PHARMD, AT 3343 08/02/19 BY D. VANHOOK    Staphylococcus aureus (BCID) DETECTED (A) NOT DETECTED Final    Comment: Methicillin (oxacillin) susceptible Staphylococcus aureus (MSSA). Preferred therapy is anti staphylococcal beta lactam antibiotic (Cefazolin or Nafcillin), unless clinically contraindicated. CRITICAL RESULT CALLED TO, READ BACK BY AND VERIFIED WITH: C. [place], AT 5686 08/02/19 BY D. Victoriano Lain  Methicillin resistance NOT DETECTED NOT DETECTED Final   Streptococcus species NOT DETECTED NOT DETECTED Final   Streptococcus agalactiae NOT DETECTED NOT DETECTED Final   Streptococcus pneumoniae NOT DETECTED NOT DETECTED Final   Streptococcus pyogenes NOT DETECTED NOT DETECTED Final   Acinetobacter baumannii NOT DETECTED NOT DETECTED Final   Enterobacteriaceae species NOT DETECTED NOT DETECTED Final   Enterobacter cloacae complex NOT DETECTED NOT DETECTED Final   Escherichia coli NOT DETECTED NOT DETECTED Final   Klebsiella oxytoca NOT DETECTED NOT DETECTED Final   Klebsiella pneumoniae NOT DETECTED NOT DETECTED Final   Proteus species NOT DETECTED NOT DETECTED Final   Serratia marcescens NOT DETECTED NOT DETECTED Final   Haemophilus influenzae NOT DETECTED NOT DETECTED Final   Neisseria meningitidis NOT DETECTED NOT DETECTED Final   Pseudomonas aeruginosa NOT DETECTED NOT DETECTED Final   Candida albicans NOT DETECTED NOT DETECTED Final   Candida  glabrata NOT DETECTED NOT DETECTED Final   Candida krusei NOT DETECTED NOT DETECTED Final   Candida parapsilosis NOT DETECTED NOT DETECTED Final   Candida tropicalis NOT DETECTED NOT DETECTED Final    Comment: Performed at [facility], [place] [address] [place], [place] [address]  Culture, blood (Routine X 2) w Reflex to ID Panel     Status: Abnormal   Collection Time: 08/01/19 12:21 PM   Specimen: BLOOD  Result Value Ref Range Status   Specimen Description   Final    BLOOD RIGHT HAND Performed at [facility] [address], [place], [place] [address]    Special Requests   Final    BOTTLES DRAWN AEROBIC AND ANAEROBIC Blood Culture adequate volume Performed at [facility] [address], [place], [place] [address]    Culture  Setup Time   Final    GRAM POSITIVE COCCI IN CLUSTERS IN BOTH AEROBIC AND ANAEROBIC BOTTLES CRITICAL RESULT CALLED TO, READ BACK BY AND VERIFIED WITH: C. SHADE PHARMD, AT 3716 08/02/19 BY D. VANHOOK    Culture (A)  Final    STAPHYLOCOCCUS AUREUS SUSCEPTIBILITIES PERFORMED ON PREVIOUS CULTURE WITHIN THE LAST 5 DAYS. Performed at [facility], [place] [address], [place], [place] [address]    Report Status 08/04/2019 FINAL  Final  Culture, respiratory     Status: None (Preliminary result)   Collection Time: 08/01/19  3:30 PM   Specimen: Tracheal Aspirate  Result Value Ref Range Status   Specimen Description   Final    TRACHEAL ASPIRATE Performed at [facility] [address] [place], [place] [address]    Special Requests   Final    NONE Performed at [facility], [place] [address] [place], [place] [address]    Gram Stain   Final    RARE WBC PRESENT, PREDOMINANTLY PMN ABUNDANT GRAM POSITIVE COCCI IN CLUSTERS Performed at [facility], [place] [address] [place], [place] [address]    Culture   Final    MODERATE STAPHYLOCOCCUS AUREUS RARE GRAM NEGATIVE RODS     Report Status PENDING  Incomplete   Organism ID, Bacteria STAPHYLOCOCCUS AUREUS  Final      Susceptibility   Staphylococcus aureus - MIC*    CIPROFLOXACIN <=0.5 SENSITIVE Sensitive     ERYTHROMYCIN <=0.25 SENSITIVE Sensitive     GENTAMICIN <=0.5 SENSITIVE Sensitive     OXACILLIN 0.5 SENSITIVE Sensitive     TETRACYCLINE <=1 SENSITIVE Sensitive     VANCOMYCIN <=0.5 SENSITIVE Sensitive     TRIMETH/SULFA <=  10 SENSITIVE Sensitive     CLINDAMYCIN <=0.25 SENSITIVE Sensitive     RIFAMPIN <=0.5 SENSITIVE Sensitive     Inducible Clindamycin NEGATIVE Sensitive     * MODERATE STAPHYLOCOCCUS AUREUS  Culture, Urine     Status: Abnormal   Collection Time: 08/01/19  5:45 PM   Specimen: Urine, Catheterized  Result Value Ref Range Status   Specimen Description   Final    URINE, CATHETERIZED Performed at [facility], [place] [address] [place], [place] [address]    Special Requests   Final    NONE Performed at [facility], [place] [address], [place], [place] [address]    Culture (A)  Final    <10,000 COLONIES/mL INSIGNIFICANT GROWTH Performed at [facility] [address] [place], [place] [address]    Report Status 08/03/2019 FINAL  Final  MRSA PCR Screening     Status: None   Collection Time: 08/02/19 10:02 AM   Specimen: Nasal Mucosa; Nasopharyngeal  Result Value Ref Range Status   MRSA by PCR NEGATIVE NEGATIVE Final    Comment:        The GeneXpert MRSA Assay (FDA approved for NASAL specimens only), is one component of a comprehensive MRSA colonization surveillance program. It is not intended to diagnose MRSA infection nor to guide or monitor treatment for MRSA infections. Performed at [facility], [place] [address] [place], [place] [address]      Radiology Studies: Dg Chest Port 1 View  Result Date: 08/03/2019 CLINICAL DATA:  Encounter for hypoxia EXAM: PORTABLE CHEST 1 VIEW COMPARISON:  Chest radiograph 08/02/2019  FINDINGS: Stable cardiomediastinal contours. Interval removal of a right central venous catheter, otherwise stable support apparatus. There is mildly improved aeration of the left lung. Persistent consolidative airspace opacities in the right lower lobe. No pneumothorax or large pleural effusion. No acute finding in the visualized skeleton. IMPRESSION: Mildly improved aeration of the left lung. Persistent consolidative opacities at the right lung base concerning for ongoing infection. Electronically Signed   By: Audie Pinto M.D.   On: 08/03/2019 17:04    Scheduled Meds: . sodium chloride   Intravenous Once  . chlorhexidine gluconate (MEDLINE KIT)  15 mL Mouth Rinse BID  . Chlorhexidine Gluconate Cloth  6 each Topical Daily  . feeding supplement (PRO-STAT SUGAR FREE 64)  30 mL Per Tube TID  . free water  250 mL Per Tube Q3H  . insulin aspart  0-9 Units Subcutaneous Q4H  . mouth rinse  15 mL Mouth Rinse 10 times per day  . oxyCODONE  5 mg Oral Q6H  . pantoprazole  40 mg Intravenous Q12H  . polyethylene glycol  17 g Per Tube BID  . QUEtiapine  25 mg Oral BID  . sennosides  5 mL Per Tube BID  . tamsulosin  0.4 mg Oral BID   Continuous Infusions: . sodium chloride Stopped (08/01/19 0957)  .  ceFAZolin (ANCEF) IV 2 g (08/05/19 0718)  . feeding supplement (VITAL 1.5 CAL) Stopped (08/04/19 2000)  . fentaNYL infusion INTRAVENOUS 100 mcg/hr (08/04/19 1900)  . midazolam Stopped (08/05/19 0737)  . norepinephrine (LEVOPHED) Adult infusion Stopped (07/28/19 2137)    LOS: 11 days   The patient is critically ill with multiple organ systems failure and requires high complexity decision making for assessment and support, frequent evaluation and titration of therapies, application of advanced monitoring technologies and extensive interpretation of multiple databases. Critical Care Time devoted to patient care services described  in this note.   Time spent: 42 minutes  Little Ishikawa, DO [facility] Pager [phone]  If 7PM-7AM, please contact night-coverage [email or web address] Password [facility] 08/05/2019, 7:39 AM

## 2019-08-05 NOTE — Progress Notes (Signed)
Spoke with patient's wife, [name]. Gave her updates on patient condition and care. Answered all questions regarding patient. Told her to call if she has further questions.

## 2019-08-05 NOTE — Progress Notes (Signed)
NAME:  [name], MRN:  [id number], DOB:  January 12, 1951, LOS: 46 ADMISSION DATE:  07/25/2019, CONSULTATION DATE:  10/3 REFERRING MD:  Bridgett Larsson, CHIEF COMPLAINT:  Dyspnea   Brief History   68 y/o male admitted on 10/3 from the [facility] ED with ARDS from COVID pneumonia leading to need for mechanical ventilation.    Past Medical History  COVID 19 Heartburn  Significant Hospital Events   10/3 admission 10/10 weaned 12 hours 10/11 dark stool  Consults:  PCCM  Procedures:  10/2 ETT>  10/2 R IJ CVL >   Significant Diagnostic Tests:  10/2 CT angiogram > personally reviewed, extensive bilateral airspace disease predominantly posterior  Micro Data:  9/20 SARS COV 2 > POSITIVE 10/2 blood >  10/9 blood > MSSA 2/4 10/9 urine >  10/9 resp >   Antimicrobials/COVID Rx  10/3 remdesivir > 10/6 10/3 actemra 10/3 decadron > 10/4 convalescent plasma, repeat 10/6 10/9 vanc > 10/10 10/9 cefepime > 10/10 10/10 Ancef >   Interim history/subjective:   Large right-sided tension pneumothorax.  Pigtail catheter placement early this morning.  Failed reexpansion of the right lung.  Manipulation of catheter connections as well as atrium resolved issue.  Please see separate documentation  Objective   Blood pressure 111/75, pulse (!) 122, temperature (!) 100.4 F (38 C), temperature source Oral, resp. rate 19, height 5\' 10"  (1.778 m), weight 90 kg, SpO2 93 %.    Vent Mode: PCV FiO2 (%):  [60 %-100 %] 90 % Set Rate:  [15 bmp] 15 bmp PEEP:  [10 LSL37-34 cmH20] 10 cmH20 Plateau Pressure:  [21 cmH20-29 cmH20] 27 cmH20   Intake/Output Summary (Last 24 hours) at 08/05/2019 0842 Last data filed at 08/05/2019 2876 Gross per 24 hour  Intake 1533.47 ml  Output 2665 ml  Net -1131.53 ml   Filed Weights   08/01/19 0500 08/02/19 0500 08/03/19 0500  Weight: 92.7 kg 92.8 kg 90 kg    Examination:  General: Patient remains intubated on mechanical ventilation, life support critically ill HENT: NCAT,  endotracheal tube in place OG tube in place, bloody secretions, PULM: Bilateral ventilated breath sounds on the left side, absent breath sounds on the right, auscultated bubbling or leak from the right chest CV: Regular rate rhythm, S1-S2 GI: Soft, nontender, nondistended MSK: Normal bulk and tone Neuro: RA SS minus a 5, well sedated on mechanical support  10/13 chest x-ray: Large right-sided pneumothorax status post chest tube, reviewed all 3 films from this morning.  Including resolution of the right-sided pneumothorax. The patient's images have been independently reviewed by me.     Resolved Hospital Problem list     Assessment & Plan:  Severe ARDS due to COVID 19 pneumonia: improving oxygenation and ventilator mechanics 10/13 right-sided tension pneumothorax back status post chest tube placement Continue mechanical ventilation per ARDS protocol Target TVol 6-8cc/kgIBW Target Plateau Pressure < 30cm H20 Target driving pressure less than 15 cm of water Target PaO2 55-65: titrate PEEP/FiO2 per protocol As long as PaO2 to FiO2 ratio is less than 1:150 position in prone position for 16 hours a day Check CVP daily if CVL in place Target CVP less than 4, diurese as necessary Ventilator associated pneumonia prevention protocol Patient remains on full mechanical ventilatory support. ARDS complicated by tension pneumothorax status post pigtail catheter placement. Please see separate procedure note and time spent assessing this issue. Worked with respiratory to alter ventilation settings. Moved from pressure control to [facility]. Therefore will have a little more control over  the amount of volume delivered to the patient.  As the patient's noncompliance has slowly been improving.  Anticoagulation/DVT prevention in setting of thrombophilia from COVID 19 Heme positive gastric output, hemoglobin stable Protonix IV twice daily Start DVT prophylaxis and observe hemoglobin  MSSA bacteremia,  Rising Leukocytosis  Antibiotics per ID, continue Ancef  Need for sedation/mechanical ventilation: severe agitation when weaning sedation RA SS goal -2/-3 Sedation with fentanyl and Versed.  Best practice:  Diet: tube feeding Pain/Anxiety/Delirium protocol (if indicated): as above VAP protocol (if indicated): yes DVT prophylaxis: as above GI prophylaxis: famotidine Glucose control: SSI Mobility: bed rest Code Status: full Family Communication: per [facility] Disposition: remain in ICU  Labs    CBC: Recent Labs  Lab 07/30/19 0451  08/02/19 0457  08/03/19 1035 08/03/19 1640 08/04/19 0444 08/04/19 0535 08/05/19 0506 08/05/19 0550  WBC 9.9   < > 18.5*  --  18.1* 20.3*  --  26.3*  --  31.9*  NEUTROABS 8.6*  --   --   --   --   --   --   --   --   --   HGB 12.9*   < > 10.5*   < > 11.0* 11.3* 11.9* 11.3* 12.6* 11.9*  HCT 41.9   < > 34.1*   < > 35.2* 35.6* 35.0* 36.5* 37.0* 38.7*  MCV 98.4   < > 98.3  --  98.1 97.8  --  99.5  --  99.7  PLT 411*   < > 281  --  300 289  --  373  --  395   < > = values in this interval not displayed.    Basic Metabolic Panel: Recent Labs  Lab 07/30/19 0451  08/01/19 0500  08/02/19 0457  08/03/19 1035 08/04/19 0444 08/04/19 0535 08/05/19 0506 08/05/19 0550  NA 149*   < > 146*   < > 145   < > 144 146* 148* 146* 149*  K 4.8   < > 4.1   < > 4.3   < > 4.8 5.0 5.0 4.5 4.3  CL 105   < > 93*  --  99  --  98  --  100  --  103  CO2 31   < > 35*  --  35*  --  35*  --  34*  --  29  GLUCOSE 138*   < > 144*  --  157*  --  134*  --  130*  --  186*  BUN 53*   < > 77*  --  74*  --  99*  --  104*  --  PENDING  CREATININE 1.43*   < > 1.96*  --  1.54*  --  1.61*  --  2.09*  --  2.78*  CALCIUM 9.0   < > 9.2  --  9.4  --  9.4  --  9.2  --  8.8*  MG 2.7*  --  2.6*  --  3.1*  --   --   --   --   --   --   PHOS 5.5*  --  5.7*  --  3.8  --   --   --   --   --   --    < > = values in this interval not displayed.   GFR: Estimated Creatinine Clearance: 29.5 mL/min  (A) (by C-G formula based on SCr of 2.78 mg/dL (H)). Recent Labs  Lab 08/01/19 0943  08/03/19 1035 08/03/19  1640 08/04/19 0535 08/05/19 0550  PROCALCITON 2.20  --   --   --   --   --   WBC  --    < > 18.1* 20.3* 26.3* 31.9*   < > = values in this interval not displayed.    Liver Function Tests: Recent Labs  Lab 08/01/19 0500 08/02/19 0457 08/03/19 1035 08/04/19 0535 08/05/19 0550  AST 56* 31 21 27 31   ALT 59* 38 28 22 19   ALKPHOS 85 91 94 121 104  BILITOT 0.7 0.7 0.7 0.5 0.6  PROT 6.3* 6.5 6.5 6.8 6.8  ALBUMIN 2.9* 3.7 3.5 3.5 3.5   No results for input(s): LIPASE, AMYLASE in the last 168 hours. No results for input(s): AMMONIA in the last 168 hours.  ABG    Component Value Date/Time   PHART 7.412 08/05/2019 0506   PCO2ART 49.3 (H) 08/05/2019 0506   PO2ART 54.0 (L) 08/05/2019 0506   HCO3 31.4 (H) 08/05/2019 0506   TCO2 33 (H) 08/05/2019 0506   ACIDBASEDEF 1.0 07/27/2019 0546   O2SAT 87.0 08/05/2019 0506     Coagulation Profile: No results for input(s): INR, PROTIME in the last 168 hours.  Cardiac Enzymes: No results for input(s): CKTOTAL, CKMB, CKMBINDEX, TROPONINI in the last 168 hours.  HbA1C: Hgb A1c MFr Bld  Date/Time Value Ref Range Status  07/26/2019 05:00 AM 6.1 (H) 4.8 - 5.6 % Final    Comment:    (NOTE) Pre diabetes:          5.7%-6.4% Diabetes:              >6.4% Glycemic control for   <7.0% adults with diabetes     CBG: Recent Labs  Lab 08/04/19 1622 08/04/19 1926 08/05/19 0005 08/05/19 0418 08/05/19 0746  GLUCAP 175* 207* 210* 196* 188*     This patient is critically ill with multiple organ system failure; which, requires frequent high complexity decision making, assessment, support, evaluation, and titration of therapies. This was completed through the application of advanced monitoring technologies and extensive interpretation of multiple databases. During this encounter critical care time was devoted to patient care services  described in this note for 48 minutes.  Garner Nash, DO [facility] 08/05/2019 8:42 AM  Personal pager: [phone] If unanswered, please page CCM On-call: [phone]

## 2019-08-05 NOTE — Progress Notes (Signed)
TRH night shift note. Follow up portable X-ray this morning is showing pneumothorax. The patient is currently on 100% of FIO2 and PEEP of 12. E-Link notified of findings.   Tennis Must, MD

## 2019-08-05 NOTE — Progress Notes (Signed)
TRH night shift.  I spoke to our anesthesia CRNA in regards to placing a central line due to the patient being hypotensive while on pressors. Anesthesia CRNA do not place lines and the anesthesia MD was unavailable at the facility at this time. I spoke to Dr. Lucile Shutters Warren Lacy), who placed orders for albumin, vasopressin and CxR. He will try to have one of our CCM come and place a central  Line.   Tennis Must, MD

## 2019-08-05 NOTE — Progress Notes (Addendum)
TRH night shift ICU coverage note.  The nursing staff reports fever with tachycardia. He is hypernatremic and has worsening AKI. He is hyperglycemic. A 1000 mL 0.45% bolus was ordered earlier. Increased free water frequency to every 3 hrs from every 4 hrs to provide extra free water.   Tennis Must, MD  Addendum: Unable to administer free water at this time.  DO

## 2019-08-05 NOTE — Progress Notes (Signed)
Pharmacy Antibiotic Note  [name] is a 68 y.o. male admitted on 07/25/2019 with COVID-19.  Pharmacy has been consulted for cefazolin dosing for MSSA bacteremia and pneumonia. Blood and trach aspirate cultures on 10/9 grew MSSA and the patient was narrowed from vancomycin and cefepime to cefazolin. WBC are trending up to 31.9 and febrile with a Tmax of 101.9. Of note, patient developed a pneumothorax this morning and a chest tube was placed. Scr has increased to 2.78 and patient's baseline is ~1.3. Will follow serum creatinine trend and if continues to increase may need to renally dose adjust cefazolin.   Of note, discussed rare GNR growing on trach aspirate culture with ID and will not change treatment unless the patient declines as the pneumothorax could explain the patient being febrile and the increase in WBC.   Plan: Continue cefazolin 2g IV q8h Monitor renal function, cultures/sensitivities, and clinical progression  Height: 5\' 10"  (177.8 cm) Weight: 198 lb 6.6 oz (90 kg) IBW/kg (Calculated) : 73  Temp (24hrs), Avg:100.8 F (38.2 C), Min:99.3 F (37.4 C), Max:101.9 F (38.8 C)  Recent Labs  Lab 08/01/19 0500 08/02/19 0457 08/03/19 1035 08/03/19 1640 08/04/19 0535 08/05/19 0550  WBC 19.2* 18.5* 18.1* 20.3* 26.3* 31.9*  CREATININE 1.96* 1.54* 1.61*  --  2.09* 2.78*    Estimated Creatinine Clearance: 29.5 mL/min (A) (by C-G formula based on SCr of 2.78 mg/dL (H)).    No Known Allergies  Antimicrobials this admission: 10/3 Remdesivir >>10/6 10/9 Vancomycin >> 10/10 10/9 Cefepime >> 10/10 10/10 Cefazolin >>   Dose adjustments this admission: N/A  Microbiology results: 10/2 BCx: no growth final  10/3 MRSA PCR: negative 10/9 UCx: <10k insignificant growth 10/9 BCx: MSSA 10/9 BCID = MSSA 10/9 tracheal aspirate: moderate s. auresus (MSSA), rare GNR 10/9 MRSA PCR: negative  Thank you for allowing pharmacy to be a part of this patient's care.   Cristela Felt,  PharmD PGY1 Pharmacy Resident [employer]: [phone]  08/05/2019 1:10 PM

## 2019-08-05 NOTE — Progress Notes (Signed)
Consent for chest tube obtained via telephone by patient's wife [name]. Procedure and process explained. All questions answered.  MD present at bedside.   Consent witnessed by Glenetta Hew and Raquel Sarna RN

## 2019-08-05 NOTE — Procedures (Signed)
Chest Tube Insertion Procedure Note  Indications:  Clinically significant Pneumothorax  Pre-operative Diagnosis: Pneumothorax  Post-operative Diagnosis: Pneumothorax  Procedure Details  Informed consent was obtained for the procedure, including sedation.  Risks of lung perforation, hemorrhage, arrhythmia, and adverse drug reaction were discussed.   After sterile skin prep, using standard technique, a 14 French tube was placed in the right anterior 5th rib space.  Findings: +fogging, continuous air leak on suction -20  Estimated Blood Loss:  Minimal         Specimens:  None              Complications:  None; patient tolerated the procedure well.         Disposition: ICU - intubated and critically ill.         Condition: stable  Attending Attestation: I performed the procedure.

## 2019-08-06 ENCOUNTER — Inpatient Hospital Stay (HOSPITAL_COMMUNITY): Payer: Medicare HMO

## 2019-08-06 DIAGNOSIS — J189 Pneumonia, unspecified organism: Secondary | ICD-10-CM | POA: Diagnosis not present

## 2019-08-06 DIAGNOSIS — Z9911 Dependence on respirator [ventilator] status: Secondary | ICD-10-CM | POA: Diagnosis not present

## 2019-08-06 DIAGNOSIS — K922 Gastrointestinal hemorrhage, unspecified: Secondary | ICD-10-CM | POA: Diagnosis not present

## 2019-08-06 DIAGNOSIS — J1289 Other viral pneumonia: Secondary | ICD-10-CM | POA: Diagnosis not present

## 2019-08-06 DIAGNOSIS — J9600 Acute respiratory failure, unspecified whether with hypoxia or hypercapnia: Secondary | ICD-10-CM | POA: Diagnosis not present

## 2019-08-06 DIAGNOSIS — Z452 Encounter for adjustment and management of vascular access device: Secondary | ICD-10-CM | POA: Diagnosis not present

## 2019-08-06 DIAGNOSIS — Z4682 Encounter for fitting and adjustment of non-vascular catheter: Secondary | ICD-10-CM | POA: Diagnosis not present

## 2019-08-06 DIAGNOSIS — Z9689 Presence of other specified functional implants: Secondary | ICD-10-CM | POA: Diagnosis not present

## 2019-08-06 DIAGNOSIS — R7881 Bacteremia: Secondary | ICD-10-CM | POA: Diagnosis not present

## 2019-08-06 DIAGNOSIS — J181 Lobar pneumonia, unspecified organism: Secondary | ICD-10-CM | POA: Diagnosis not present

## 2019-08-06 DIAGNOSIS — U071 COVID-19: Secondary | ICD-10-CM | POA: Diagnosis not present

## 2019-08-06 DIAGNOSIS — E8770 Fluid overload, unspecified: Secondary | ICD-10-CM | POA: Diagnosis not present

## 2019-08-06 DIAGNOSIS — J9601 Acute respiratory failure with hypoxia: Secondary | ICD-10-CM | POA: Diagnosis not present

## 2019-08-06 DIAGNOSIS — J930 Spontaneous tension pneumothorax: Secondary | ICD-10-CM | POA: Diagnosis not present

## 2019-08-06 DIAGNOSIS — N400 Enlarged prostate without lower urinary tract symptoms: Secondary | ICD-10-CM | POA: Diagnosis not present

## 2019-08-06 DIAGNOSIS — J939 Pneumothorax, unspecified: Secondary | ICD-10-CM | POA: Diagnosis not present

## 2019-08-06 DIAGNOSIS — D72829 Elevated white blood cell count, unspecified: Secondary | ICD-10-CM | POA: Diagnosis not present

## 2019-08-06 DIAGNOSIS — Z4659 Encounter for fitting and adjustment of other gastrointestinal appliance and device: Secondary | ICD-10-CM | POA: Diagnosis not present

## 2019-08-06 DIAGNOSIS — I4891 Unspecified atrial fibrillation: Secondary | ICD-10-CM | POA: Diagnosis not present

## 2019-08-06 DIAGNOSIS — B9561 Methicillin susceptible Staphylococcus aureus infection as the cause of diseases classified elsewhere: Secondary | ICD-10-CM | POA: Diagnosis not present

## 2019-08-06 DIAGNOSIS — N179 Acute kidney failure, unspecified: Secondary | ICD-10-CM | POA: Diagnosis not present

## 2019-08-06 DIAGNOSIS — Z978 Presence of other specified devices: Secondary | ICD-10-CM | POA: Diagnosis not present

## 2019-08-06 LAB — POCT I-STAT 7, (LYTES, BLD GAS, ICA,H+H)
Acid-Base Excess: 1 mmol/L (ref 0.0–2.0)
Acid-Base Excess: 1 mmol/L (ref 0.0–2.0)
Acid-Base Excess: 1 mmol/L (ref 0.0–2.0)
Acid-Base Excess: 7 mmol/L — ABNORMAL HIGH (ref 0.0–2.0)
Acid-base deficit: 2 mmol/L (ref 0.0–2.0)
Acid-base deficit: 3 mmol/L — ABNORMAL HIGH (ref 0.0–2.0)
Bicarbonate: 26.3 mmol/L (ref 20.0–28.0)
Bicarbonate: 26.5 mmol/L (ref 20.0–28.0)
Bicarbonate: 28 mmol/L (ref 20.0–28.0)
Bicarbonate: 29.3 mmol/L — ABNORMAL HIGH (ref 20.0–28.0)
Bicarbonate: 30.7 mmol/L — ABNORMAL HIGH (ref 20.0–28.0)
Bicarbonate: 32.9 mmol/L — ABNORMAL HIGH (ref 20.0–28.0)
Calcium, Ion: 1.03 mmol/L — ABNORMAL LOW (ref 1.15–1.40)
Calcium, Ion: 1.09 mmol/L — ABNORMAL LOW (ref 1.15–1.40)
Calcium, Ion: 1.16 mmol/L (ref 1.15–1.40)
Calcium, Ion: 1.16 mmol/L (ref 1.15–1.40)
Calcium, Ion: 1.17 mmol/L (ref 1.15–1.40)
Calcium, Ion: 1.19 mmol/L (ref 1.15–1.40)
HCT: 26 % — ABNORMAL LOW (ref 39.0–52.0)
HCT: 30 % — ABNORMAL LOW (ref 39.0–52.0)
HCT: 30 % — ABNORMAL LOW (ref 39.0–52.0)
HCT: 35 % — ABNORMAL LOW (ref 39.0–52.0)
HCT: 35 % — ABNORMAL LOW (ref 39.0–52.0)
HCT: 38 % — ABNORMAL LOW (ref 39.0–52.0)
Hemoglobin: 10.2 g/dL — ABNORMAL LOW (ref 13.0–17.0)
Hemoglobin: 10.2 g/dL — ABNORMAL LOW (ref 13.0–17.0)
Hemoglobin: 11.9 g/dL — ABNORMAL LOW (ref 13.0–17.0)
Hemoglobin: 11.9 g/dL — ABNORMAL LOW (ref 13.0–17.0)
Hemoglobin: 12.9 g/dL — ABNORMAL LOW (ref 13.0–17.0)
Hemoglobin: 8.8 g/dL — ABNORMAL LOW (ref 13.0–17.0)
O2 Saturation: 82 %
O2 Saturation: 84 %
O2 Saturation: 86 %
O2 Saturation: 87 %
O2 Saturation: 87 %
O2 Saturation: 89 %
Patient temperature: 100.4
Patient temperature: 101.6
Patient temperature: 37.4
Patient temperature: 37.5
Patient temperature: 38.5
Patient temperature: 99.8
Potassium: 4.3 mmol/L (ref 3.5–5.1)
Potassium: 4.3 mmol/L (ref 3.5–5.1)
Potassium: 4.5 mmol/L (ref 3.5–5.1)
Potassium: 4.7 mmol/L (ref 3.5–5.1)
Potassium: 4.8 mmol/L (ref 3.5–5.1)
Potassium: 4.8 mmol/L (ref 3.5–5.1)
Sodium: 146 mmol/L — ABNORMAL HIGH (ref 135–145)
Sodium: 146 mmol/L — ABNORMAL HIGH (ref 135–145)
Sodium: 147 mmol/L — ABNORMAL HIGH (ref 135–145)
Sodium: 147 mmol/L — ABNORMAL HIGH (ref 135–145)
Sodium: 148 mmol/L — ABNORMAL HIGH (ref 135–145)
Sodium: 148 mmol/L — ABNORMAL HIGH (ref 135–145)
TCO2: 28 mmol/L (ref 22–32)
TCO2: 29 mmol/L (ref 22–32)
TCO2: 30 mmol/L (ref 22–32)
TCO2: 31 mmol/L (ref 22–32)
TCO2: 33 mmol/L — ABNORMAL HIGH (ref 22–32)
TCO2: 34 mmol/L — ABNORMAL HIGH (ref 22–32)
pCO2 arterial: 54 mmHg — ABNORMAL HIGH (ref 32.0–48.0)
pCO2 arterial: 57.6 mmHg — ABNORMAL HIGH (ref 32.0–48.0)
pCO2 arterial: 63.3 mmHg — ABNORMAL HIGH (ref 32.0–48.0)
pCO2 arterial: 70.2 mmHg (ref 32.0–48.0)
pCO2 arterial: 75.1 mmHg (ref 32.0–48.0)
pCO2 arterial: 79 mmHg (ref 32.0–48.0)
pH, Arterial: 7.158 — CL (ref 7.350–7.450)
pH, Arterial: 7.184 — CL (ref 7.350–7.450)
pH, Arterial: 7.206 — ABNORMAL LOW (ref 7.350–7.450)
pH, Arterial: 7.277 — ABNORMAL LOW (ref 7.350–7.450)
pH, Arterial: 7.303 — ABNORMAL LOW (ref 7.350–7.450)
pH, Arterial: 7.397 (ref 7.350–7.450)
pO2, Arterial: 56 mmHg — ABNORMAL LOW (ref 83.0–108.0)
pO2, Arterial: 61 mmHg — ABNORMAL LOW (ref 83.0–108.0)
pO2, Arterial: 63 mmHg — ABNORMAL LOW (ref 83.0–108.0)
pO2, Arterial: 64 mmHg — ABNORMAL LOW (ref 83.0–108.0)
pO2, Arterial: 70 mmHg — ABNORMAL LOW (ref 83.0–108.0)
pO2, Arterial: 74 mmHg — ABNORMAL LOW (ref 83.0–108.0)

## 2019-08-06 LAB — COMPREHENSIVE METABOLIC PANEL
ALT: 9 U/L (ref 0–44)
AST: 22 U/L (ref 15–41)
Albumin: 3.2 g/dL — ABNORMAL LOW (ref 3.5–5.0)
Alkaline Phosphatase: 45 U/L (ref 38–126)
Anion gap: 13 (ref 5–15)
BUN: 164 mg/dL — ABNORMAL HIGH (ref 8–23)
CO2: 28 mmol/L (ref 22–32)
Calcium: 7.6 mg/dL — ABNORMAL LOW (ref 8.9–10.3)
Chloride: 108 mmol/L (ref 98–111)
Creatinine, Ser: 4.25 mg/dL — ABNORMAL HIGH (ref 0.61–1.24)
GFR calc Af Amer: 16 mL/min — ABNORMAL LOW (ref >60)
GFR calc non Af Amer: 14 mL/min — ABNORMAL LOW (ref >60)
Glucose, Bld: 229 mg/dL — ABNORMAL HIGH (ref 70–99)
Potassium: 4.2 mmol/L (ref 3.5–5.1)
Sodium: 149 mmol/L — ABNORMAL HIGH (ref 135–145)
Total Bilirubin: 1 mg/dL (ref 0.3–1.2)
Total Protein: 5.3 g/dL — ABNORMAL LOW (ref 6.5–8.1)

## 2019-08-06 LAB — GLUCOSE, CAPILLARY
Glucose-Capillary: 167 mg/dL — ABNORMAL HIGH (ref 70–99)
Glucose-Capillary: 169 mg/dL — ABNORMAL HIGH (ref 70–99)
Glucose-Capillary: 183 mg/dL — ABNORMAL HIGH (ref 70–99)
Glucose-Capillary: 222 mg/dL — ABNORMAL HIGH (ref 70–99)
Glucose-Capillary: 228 mg/dL — ABNORMAL HIGH (ref 70–99)
Glucose-Capillary: 256 mg/dL — ABNORMAL HIGH (ref 70–99)
Glucose-Capillary: 297 mg/dL — ABNORMAL HIGH (ref 70–99)

## 2019-08-06 LAB — CULTURE, RESPIRATORY W GRAM STAIN

## 2019-08-06 LAB — CBC
HCT: 32.9 % — ABNORMAL LOW (ref 39.0–52.0)
HCT: 31.3 % — ABNORMAL LOW (ref 39.0–52.0)
Hemoglobin: 9.8 g/dL — ABNORMAL LOW (ref 13.0–17.0)
Hemoglobin: 9.5 g/dL — ABNORMAL LOW (ref 13.0–17.0)
MCH: 30.7 pg (ref 26.0–34.0)
MCH: 31.4 pg (ref 26.0–34.0)
MCHC: 29.8 g/dL — ABNORMAL LOW (ref 30.0–36.0)
MCHC: 30.4 g/dL (ref 30.0–36.0)
MCV: 103.1 fL — ABNORMAL HIGH (ref 80.0–100.0)
MCV: 103.3 fL — ABNORMAL HIGH (ref 80.0–100.0)
Platelets: 259 10*3/uL (ref 150–400)
Platelets: 245 10*3/uL (ref 150–400)
RBC: 3.19 MIL/uL — ABNORMAL LOW (ref 4.22–5.81)
RBC: 3.03 MIL/uL — ABNORMAL LOW (ref 4.22–5.81)
RDW: 14.8 % (ref 11.5–15.5)
RDW: 15.2 % (ref 11.5–15.5)
WBC: 14.8 10*3/uL — ABNORMAL HIGH (ref 4.0–10.5)
WBC: 15.9 10*3/uL — ABNORMAL HIGH (ref 4.0–10.5)
nRBC: 1.2 % — ABNORMAL HIGH (ref 0.0–0.2)
nRBC: 0.8 % — ABNORMAL HIGH (ref 0.0–0.2)

## 2019-08-06 LAB — D-DIMER, QUANTITATIVE: D-Dimer, Quant: 2.98 ug/mL-FEU — ABNORMAL HIGH (ref 0.00–0.50)

## 2019-08-06 LAB — FERRITIN: Ferritin: 681 ng/mL — ABNORMAL HIGH (ref 24–336)

## 2019-08-06 LAB — C-REACTIVE PROTEIN: CRP: 4.6 mg/dL — ABNORMAL HIGH (ref <1.0)

## 2019-08-06 LAB — OSMOLALITY: Osmolality: 379 mOsm/kg (ref 275–295)

## 2019-08-06 MED ORDER — HEPARIN SODIUM (PORCINE) 1000 UNIT/ML DIALYSIS
1000.0000 [IU] | INTRAMUSCULAR | Status: DC | PRN
  Administered 2019-08-06 – 2019-08-13 (×3): 2800 [IU] via INTRAVENOUS_CENTRAL
  Filled 2019-08-06: qty 3
  Filled 2019-08-06 (×3): qty 6
  Filled 2019-08-06 (×3): qty 3

## 2019-08-06 MED ORDER — NOREPINEPHRINE 16 MG/250ML-% IV SOLN
0.0000 ug/min | INTRAVENOUS | Status: DC
  Administered 2019-08-06 (×2): 40 ug/min via INTRAVENOUS
  Administered 2019-08-07: 45 ug/min via INTRAVENOUS
  Administered 2019-08-07: 08:00:00 60 ug/min via INTRAVENOUS
  Administered 2019-08-07: 48 ug/min via INTRAVENOUS
  Administered 2019-08-08: 24 ug/min via INTRAVENOUS
  Administered 2019-08-08: 26 ug/min via INTRAVENOUS
  Administered 2019-08-09: 7 ug/min via INTRAVENOUS
  Administered 2019-08-10: 3 ug/min via INTRAVENOUS
  Administered 2019-08-11: 14:00:00 8 ug/min via INTRAVENOUS
  Administered 2019-08-13: 15 ug/min via INTRAVENOUS
  Administered 2019-08-14: 12 ug/min via INTRAVENOUS
  Administered 2019-08-15: 2 ug/min via INTRAVENOUS
  Administered 2019-08-20: 8 ug/min via INTRAVENOUS
  Administered 2019-08-21: 2 ug/min via INTRAVENOUS
  Filled 2019-08-06 (×16): qty 250

## 2019-08-06 MED ORDER — SODIUM CHLORIDE 0.45 % IV BOLUS
1000.0000 mL | Freq: Once | INTRAVENOUS | Status: AC
  Administered 2019-08-06: 04:00:00 1000 mL via INTRAVENOUS

## 2019-08-06 MED ORDER — "THROMBI-PAD 3""X3"" EX PADS"
1.0000 | MEDICATED_PAD | Freq: Once | CUTANEOUS | Status: AC
  Administered 2019-08-06: 21:00:00 1 via TOPICAL
  Filled 2019-08-06: qty 1

## 2019-08-06 MED ORDER — AMIODARONE LOAD VIA INFUSION
150.0000 mg | Freq: Once | INTRAVENOUS | Status: AC
  Administered 2019-08-07: 150 mg via INTRAVENOUS
  Filled 2019-08-06: qty 83.34

## 2019-08-06 MED ORDER — PRO-STAT SUGAR FREE PO LIQD
60.0000 mL | Freq: Three times a day (TID) | ORAL | Status: DC
  Administered 2019-08-06 – 2019-08-27 (×61): 60 mL
  Filled 2019-08-06 (×61): qty 60

## 2019-08-06 MED ORDER — SODIUM CHLORIDE 0.9 % IV SOLN
2.0000 g | Freq: Two times a day (BID) | INTRAVENOUS | Status: DC
  Administered 2019-08-06: 2 g via INTRAVENOUS
  Filled 2019-08-06: qty 2

## 2019-08-06 MED ORDER — ALBUMIN HUMAN 5 % IV SOLN
12.5000 g | Freq: Once | INTRAVENOUS | Status: AC
  Administered 2019-08-06: 04:00:00 12.5 g via INTRAVENOUS
  Filled 2019-08-06: qty 250

## 2019-08-06 MED ORDER — OXYCODONE HCL 5 MG PO TABS: 5.0000 mg | ORAL_TABLET | Freq: Four times a day (QID) | ORAL | Status: DC

## 2019-08-06 MED ORDER — CISATRACURIUM BOLUS VIA INFUSION
10.0000 mg | Freq: Once | INTRAVENOUS | Status: DC
  Filled 2019-08-06: qty 10

## 2019-08-06 MED ORDER — PIVOT 1.5 CAL PO LIQD
1000.0000 mL | ORAL | Status: DC
  Administered 2019-08-06 – 2019-08-13 (×10): 1000 mL

## 2019-08-06 MED ORDER — EPINEPHRINE HCL 5 MG/250ML IV SOLN IN NS
0.5000 ug/min | INTRAVENOUS | Status: DC
  Administered 2019-08-06: 0.5 ug/min via INTRAVENOUS
  Administered 2019-08-07 (×2): 20 ug/min via INTRAVENOUS
  Filled 2019-08-06 (×6): qty 250

## 2019-08-06 MED ORDER — LACTATED RINGERS IV BOLUS
1000.0000 mL | Freq: Once | INTRAVENOUS | Status: AC
  Administered 2019-08-06: 1000 mL via INTRAVENOUS

## 2019-08-06 MED ORDER — SODIUM BICARBONATE 8.4 % IV SOLN
INTRAVENOUS | Status: DC
  Administered 2019-08-06: 18:00:00 via INTRAVENOUS
  Filled 2019-08-06 (×3): qty 150

## 2019-08-06 MED ORDER — PHENYLEPHRINE HCL-NACL 10-0.9 MG/250ML-% IV SOLN
0.0000 ug/min | INTRAVENOUS | Status: DC
  Administered 2019-08-06: 20 ug/min via INTRAVENOUS
  Filled 2019-08-06: qty 250

## 2019-08-06 MED ORDER — AMIODARONE HCL IN DEXTROSE 360-4.14 MG/200ML-% IV SOLN
30.0000 mg/h | INTRAVENOUS | Status: DC
  Administered 2019-08-07: 30 mg/h via INTRAVENOUS
  Filled 2019-08-06: qty 200

## 2019-08-06 MED ORDER — SODIUM CHLORIDE 0.9 % IV SOLN
2.0000 g | INTRAVENOUS | Status: DC
  Administered 2019-08-07: 2 g via INTRAVENOUS
  Filled 2019-08-06: qty 2

## 2019-08-06 MED ORDER — AMIODARONE HCL IN DEXTROSE 360-4.14 MG/200ML-% IV SOLN
60.0000 mg/h | INTRAVENOUS | Status: AC
  Administered 2019-08-07 (×2): 60 mg/h via INTRAVENOUS

## 2019-08-06 MED ORDER — HYDROCORTISONE NA SUCCINATE PF 100 MG IJ SOLR
50.0000 mg | Freq: Four times a day (QID) | INTRAMUSCULAR | Status: DC
  Administered 2019-08-06 – 2019-08-10 (×18): 50 mg via INTRAVENOUS
  Filled 2019-08-06 (×18): qty 2

## 2019-08-06 MED ORDER — OXYCODONE HCL 5 MG PO TABS
5.0000 mg | ORAL_TABLET | Freq: Four times a day (QID) | ORAL | Status: DC
  Administered 2019-08-06 – 2019-08-14 (×30): 5 mg via ORAL
  Filled 2019-08-06 (×30): qty 1

## 2019-08-06 MED ORDER — SODIUM CHLORIDE 0.9 % IV SOLN
INTRAVENOUS | Status: DC | PRN
  Administered 2019-08-06 – 2019-08-15 (×2): via INTRA_ARTERIAL

## 2019-08-06 MED ORDER — SODIUM CHLORIDE 0.9 % IV SOLN
200.0000 mg | INTRAVENOUS | Status: AC
  Administered 2019-08-06: 200 mg via INTRAVENOUS
  Filled 2019-08-06: qty 200

## 2019-08-06 MED ORDER — SODIUM CHLORIDE 0.9 % IV SOLN
100.0000 mg | INTRAVENOUS | Status: DC
  Administered 2019-08-07: 100 mg via INTRAVENOUS
  Filled 2019-08-06 (×2): qty 100

## 2019-08-06 MED ORDER — SODIUM CHLORIDE 0.45 % IV BOLUS: 500.0000 mL | Freq: Once | INTRAVENOUS | Status: DC

## 2019-08-06 NOTE — Progress Notes (Signed)
Dr. Mariane Masters verified placement of CVC at bedside.

## 2019-08-06 NOTE — Care Management Important Message (Signed)
Important Message  Patient Details  Name: [name] MRN: [id number] Date of Birth: March 31, 1951   Medicare Important Message Given:  Yes - Important Message mailed due to current National Emergency  Verbal consent obtained due to current National Emergency  Relationship to patient: Spouse/Significant Other Contact Name: Sherlynn Stalls Call Date: 08/06/19  Time: 1149 Phone: [phone] Outcome: Spoke with contact Important Message mailed to: Patient address on file       [name] Montine Circle 08/06/2019, 11:50 AM

## 2019-08-06 NOTE — Progress Notes (Signed)
eLink Physician-Brief Progress Note Patient Name: [name] DOB: 1951/04/22 MRN: [id number]   Date of Service  08/06/2019  HPI/Events of Note  Covid-19 pneumonia with hypercapnic, hypoxemic respiratory failure, respiratory acidosis. Also has developed Afib with RVR.  eICU Interventions  Amiodarone 150 mg bolus followed by infusion, Pt to receive versed 2 mg iv Q 1 hour as needed x 3 hours, Nimbex 10 mg iv x 1, ABG to be repeated at 1 am to gauge utility of paralyzing him in terms of improving oxygenation and ventilation, patient cannot be fully proned due to chest tube.        [name] [name] 08/06/2019, 11:55 PM

## 2019-08-06 NOTE — Progress Notes (Signed)
[facility] PCCM  Called by radiology, r pneumothorax bigger, possibly free air under diaphragm  Will place large bore chest tube on R then send for stat CT chest  Ruby Cola

## 2019-08-06 NOTE — Progress Notes (Signed)
[facility] PCCM  I called [name] to let her know that his condition has been worsening today and I am very concerned about his overall condition.   Given instability will order lateral decubitus film to look for free air  Roselie Awkward, MD [place] [facility] Pager: [phone] Cell: [phone] If no response, call [phone]

## 2019-08-06 NOTE — Progress Notes (Signed)
[facility] PCCM  Worsening mixed resp/metabolic acidosis Add sodium bicarbonate infusion  Albert Awkward, MD [place] [facility] Pager: [phone] Cell: [phone] If no response, call [phone]

## 2019-08-06 NOTE — Procedures (Signed)
Central Venous Catheter Insertion Procedure Note  Procedure: Insertion of Central Venous Catheter  Indications:  vascular access  Procedure Details  Informed consent was obtained for the procedure, including sedation.  Risks of lung perforation, hemorrhage, arrhythmia, and adverse drug reaction were discussed.   Maximum sterile technique was used including antiseptics, cap, gloves, gown, hand hygiene, mask and sheet.  Under sterile conditions the skin above the on the right internal jugular vein was prepped with betadine and covered with a sterile drape. Local anesthesia was applied to the skin and subcutaneous tissues. A 22-gauge needle was used to identify the vein. An 18-gauge needle was then inserted into the vein. A guide wire was then passed easily through the catheter. There were no arrhythmias, occasional PVCs. The catheter was then withdrawn. A 7.0 French triple-lumen was then inserted into the vessel over the guide wire. The catheter was sutured into place.  Findings: There were no changes to vital signs. Catheter was flushed with 10 cc NS. Patient did tolerate procedure well.  Recommendations: CXR ordered to verify placement.

## 2019-08-06 NOTE — Progress Notes (Signed)
Called and updated pts wife, [name], on pts status. Explained to her the procedures we performed last night and the medications her husband is receiving. All questions answered at this time.

## 2019-08-06 NOTE — Op Note (Signed)
Chest Tube Insertion Procedure Note  Indications:  Clinically significant Pneumothorax, large air leak  Pre-operative Diagnosis: Pneumothorax  Post-operative Diagnosis: Pneumothorax  Procedure Details  Informed consent was obtained for the procedure, including sedation.  Risks of lung perforation, hemorrhage, arrhythmia, and adverse drug reaction were discussed.   After sterile skin prep, using standard technique, a 28 French tube was placed in the right lateral 6th rib space.  Findings: None  Estimated Blood Loss:  Minimal         Specimens:  None              Complications:  None; patient tolerated the procedure well.         Disposition: ICU - intubated and critically ill.         Condition: stable  Attending Attestation: I performed the procedure.  Roselie Awkward, MD [place] [facility] Pager: [phone] Cell: [phone] If no response, call [phone]

## 2019-08-06 NOTE — Procedures (Signed)
Hemodialysis Catheter Insertion Procedure Note Lior Hoen [id number] 01/16/1951  Procedure: Insertion of Central Venous Catheter Indications: Hemodialysis  Procedure Details Consent: Risks of procedure as well as the alternatives and risks of each were explained to the (patient/caregiver).  Consent for procedure obtained. Time Out: Verified patient identification, verified procedure, site/side was marked, verified correct patient position, special equipment/implants available, medications/allergies/relevent history reviewed, required imaging and test results available.  Performed  Maximum sterile technique was used including antiseptics, cap, gloves, gown, hand hygiene, mask and sheet. Skin prep: Chlorhexidine; local anesthetic administered A antimicrobial bonded/coated triple lumen catheter was placed in the left internal jugular vein using the Seldinger technique.  Ultrasound was used to verify the patency of the vein and for real time needle guidance.  I had to replace the hemodialysis catheter because the initial placement went into the R brachiocephalic.  Evaluation Blood flow good Complications: No apparent complications Patient did tolerate procedure well. Chest X-ray ordered to verify placement.  CXR: pending.  Simonne Maffucci 08/06/2019, 12:50 PM

## 2019-08-06 NOTE — Progress Notes (Signed)
Pulled 14 French chest tube placed on 10/13 per Dr. Lake Bells orders. Tube was intact when removed. Vaseline gauze dressing placed over area. Patient tolerated well, no complications.

## 2019-08-06 NOTE — Progress Notes (Signed)
[facility]   Reason for visit: Follow up on Staph aureus bacteremia  Interval History: WBC down to 14.8, afebrile this am Day 5 cefazolin Now started cefepime, anidulfungin  Physical Exam: Not examined Vitals:   08/06/19 0755 08/06/19 1211  BP: (!) 92/52 103/76  Pulse: (!) 109 (!) 121  Resp: (!) 32 (!) 30  Temp:    SpO2: 90% (!) 88%    Lab Results  Component Value Date   WBC 14.8 (H) 08/06/2019   HGB 9.8 (L) 08/06/2019   HCT 32.9 (L) 08/06/2019   MCV 103.1 (H) 08/06/2019   PLT 259 08/06/2019    Lab Results  Component Value Date   CREATININE 4.25 (H) 08/06/2019   BUN 164 (H) 08/06/2019   NA 149 (H) 08/06/2019   K 4.2 08/06/2019   CL 108 08/06/2019   CO2 28 08/06/2019    Lab Results  Component Value Date   ALT 9 08/06/2019   AST 22 08/06/2019   ALKPHOS 45 08/06/2019     Microbiology: Recent Results (from the past 240 hour(s))  Culture, blood (Routine X 2) w Reflex to ID Panel     Status: Abnormal   Collection Time: 08/01/19 12:15 PM   Specimen: BLOOD  Result Value Ref Range Status   Specimen Description   Final    BLOOD RIGHT ARM Performed at [facility], [place] [address] [place], [place] [address]    Special Requests   Final    BOTTLES DRAWN AEROBIC AND ANAEROBIC Blood Culture adequate volume Performed at [facility] [address], [place], [place] [address]    Culture  Setup Time   Final    GRAM POSITIVE COCCI IN CLUSTERS IN BOTH AEROBIC AND ANAEROBIC BOTTLES CRITICAL RESULT CALLED TO, READ BACK BY AND VERIFIED WITH: C. SHADE PHARMD, AT 6269 08/02/19 BY Rush [facility]mark Performed at [facility], [place] [address], [place], [place] [address]    Culture STAPHYLOCOCCUS AUREUS (A)  Final   Report Status 08/04/2019 FINAL  Final   Organism ID, Bacteria STAPHYLOCOCCUS AUREUS  Final      Susceptibility   Staphylococcus aureus - MIC*    CIPROFLOXACIN <=0.5 SENSITIVE Sensitive    ERYTHROMYCIN <=0.25 SENSITIVE Sensitive     GENTAMICIN <=0.5 SENSITIVE Sensitive     OXACILLIN 0.5 SENSITIVE Sensitive     TETRACYCLINE <=1 SENSITIVE Sensitive     VANCOMYCIN 1 SENSITIVE Sensitive     TRIMETH/SULFA <=10 SENSITIVE Sensitive     CLINDAMYCIN <=0.25 SENSITIVE Sensitive     RIFAMPIN <=0.5 SENSITIVE Sensitive     Inducible Clindamycin NEGATIVE Sensitive     * STAPHYLOCOCCUS AUREUS  Blood Culture ID Panel (Reflexed)     Status: Abnormal   Collection Time: 08/01/19 12:15 PM  Result Value Ref Range Status   Enterococcus species NOT DETECTED NOT DETECTED Final   Listeria monocytogenes NOT DETECTED NOT DETECTED Final   Staphylococcus species DETECTED (A) NOT DETECTED Final    Comment: CRITICAL RESULT CALLED TO, READ BACK BY AND VERIFIED WITH: C. SHADE PHARMD, AT 2703 08/02/19 BY D. VANHOOK    Staphylococcus aureus (BCID) DETECTED (A) NOT DETECTED Final    Comment: Methicillin (oxacillin) susceptible Staphylococcus aureus (MSSA). Preferred therapy is anti staphylococcal beta lactam antibiotic (Cefazolin or Nafcillin), unless clinically contraindicated. CRITICAL RESULT CALLED TO, READ BACK BY AND VERIFIED WITH: C. SHADE PHARMD, AT 5009 08/02/19 BY D. VANHOOK    Methicillin resistance NOT DETECTED NOT DETECTED Final  Streptococcus species NOT DETECTED NOT DETECTED Final   Streptococcus agalactiae NOT DETECTED NOT DETECTED Final   Streptococcus pneumoniae NOT DETECTED NOT DETECTED Final   Streptococcus pyogenes NOT DETECTED NOT DETECTED Final   Acinetobacter baumannii NOT DETECTED NOT DETECTED Final   Enterobacteriaceae species NOT DETECTED NOT DETECTED Final   Enterobacter cloacae complex NOT DETECTED NOT DETECTED Final   Escherichia coli NOT DETECTED NOT DETECTED Final   Klebsiella oxytoca NOT DETECTED NOT DETECTED Final   Klebsiella pneumoniae NOT DETECTED NOT DETECTED Final   Proteus species NOT DETECTED NOT DETECTED Final   Serratia marcescens NOT DETECTED NOT DETECTED  Final   Haemophilus influenzae NOT DETECTED NOT DETECTED Final   Neisseria meningitidis NOT DETECTED NOT DETECTED Final   Pseudomonas aeruginosa NOT DETECTED NOT DETECTED Final   Candida albicans NOT DETECTED NOT DETECTED Final   Candida glabrata NOT DETECTED NOT DETECTED Final   Candida krusei NOT DETECTED NOT DETECTED Final   Candida parapsilosis NOT DETECTED NOT DETECTED Final   Candida tropicalis NOT DETECTED NOT DETECTED Final    Comment: Performed at [facility], [place] [address], [place], [place] [address]  Culture, blood (Routine X 2) w Reflex to ID Panel     Status: Abnormal   Collection Time: 08/01/19 12:21 PM   Specimen: BLOOD  Result Value Ref Range Status   Specimen Description   Final    BLOOD RIGHT HAND Performed at [facility] [address] [place], [place] [address]    Special Requests   Final    BOTTLES DRAWN AEROBIC AND ANAEROBIC Blood Culture adequate volume Performed at [facility] [address], [place], [place] [address]    Culture  Setup Time   Final    GRAM POSITIVE COCCI IN CLUSTERS IN BOTH AEROBIC AND ANAEROBIC BOTTLES CRITICAL RESULT CALLED TO, READ BACK BY AND VERIFIED WITH: C. SHADE PHARMD, AT 5009 08/02/19 BY D. VANHOOK    Culture (A)  Final    STAPHYLOCOCCUS AUREUS SUSCEPTIBILITIES PERFORMED ON PREVIOUS CULTURE WITHIN THE LAST 5 DAYS. Performed at [facility], [place] [address], [place], [place] [address]    Report Status 08/04/2019 FINAL  Final  Culture, respiratory     Status: None   Collection Time: 08/01/19  3:30 PM   Specimen: Tracheal Aspirate  Result Value Ref Range Status   Specimen Description   Final    TRACHEAL ASPIRATE Performed at [facility] [address] [employer] [place], [place] [address]    Special Requests   Final    NONE Performed at [facility], [place] [address] [place],  [address]    Gram Stain   Final    RARE WBC PRESENT,  PREDOMINANTLY PMN ABUNDANT GRAM POSITIVE COCCI IN CLUSTERS Performed at [facility], [place] [address] [place],  [address]    Culture   Final    MODERATE STAPHYLOCOCCUS AUREUS RARE PSEUDOMONAS AERUGINOSA    Report Status 08/06/2019 FINAL  Final   Organism ID, Bacteria STAPHYLOCOCCUS AUREUS  Final   Organism ID, Bacteria PSEUDOMONAS AERUGINOSA  Final      Susceptibility   Pseudomonas aeruginosa - MIC*    CEFTAZIDIME 4 SENSITIVE Sensitive     CIPROFLOXACIN 0.5 SENSITIVE Sensitive     GENTAMICIN <=1 SENSITIVE Sensitive     IMIPENEM 1 SENSITIVE Sensitive     PIP/TAZO 16 SENSITIVE Sensitive     CEFEPIME 4 SENSITIVE Sensitive     * RARE PSEUDOMONAS  AERUGINOSA   Staphylococcus aureus - MIC*    CIPROFLOXACIN <=0.5 SENSITIVE Sensitive     ERYTHROMYCIN <=0.25 SENSITIVE Sensitive     GENTAMICIN <=0.5 SENSITIVE Sensitive     OXACILLIN 0.5 SENSITIVE Sensitive     TETRACYCLINE <=1 SENSITIVE Sensitive     VANCOMYCIN <=0.5 SENSITIVE Sensitive     TRIMETH/SULFA <=10 SENSITIVE Sensitive     CLINDAMYCIN <=0.25 SENSITIVE Sensitive     RIFAMPIN <=0.5 SENSITIVE Sensitive     Inducible Clindamycin NEGATIVE Sensitive     * MODERATE STAPHYLOCOCCUS AUREUS  Culture, Urine     Status: Abnormal   Collection Time: 08/01/19  5:45 PM   Specimen: Urine, Catheterized  Result Value Ref Range Status   Specimen Description   Final    URINE, CATHETERIZED Performed at [facility] [address] [place], [place] [address]    Special Requests   Final    NONE Performed at [facility], [place] [address], [place], [place] [address]    Culture (A)  Final    <10,000 COLONIES/mL INSIGNIFICANT GROWTH Performed at [facility] [address] [place], [place] [address]    Report Status 08/03/2019 FINAL  Final  MRSA PCR Screening     Status: None   Collection Time: 08/02/19 10:02 AM   Specimen: Nasal Mucosa; Nasopharyngeal  Result Value Ref Range Status    MRSA by PCR NEGATIVE NEGATIVE Final    Comment:        The GeneXpert MRSA Assay (FDA approved for NASAL specimens only), is one component of a comprehensive MRSA colonization surveillance program. It is not intended to diagnose MRSA infection nor to guide or monitor treatment for MRSA infections. Performed at [facility], [place] [address] [place], [place] [address]     Impression/Plan:  1. MSSA bacteremia - on appropriate therapy and will need a prolonged course  2. GNR trach aspirate - hard to know if causing issues but agree with broadening to cefepime.

## 2019-08-06 NOTE — Progress Notes (Signed)
[facility] P[facility]M  Worsening hypotension/shock Now on levophed max, vasopressin, neosynephrine  Too unstable for transfer to CT   Plan Start epinephrine Check stat CBC Check ABG Bolus LR 1 liter now  Will notify his wife  Roselie Awkward, MD [facility] Pager: [phone] Cell: [phone] If no response, call [phone]

## 2019-08-06 NOTE — Progress Notes (Signed)
Pt vent mode changed d/t desynchrony on the vent and ABG result of 7.20/79/74/30.  Change is attempting to ventilate pt better and help control pt's breathing better.  Rt will obtain another ABG within 1 hour.

## 2019-08-06 NOTE — Progress Notes (Signed)
[facility] night shift ICU coverage.  The patient's blood pressure continues to remain low despite maximized norepinephrine and vasopressin infusions. Since admit, he is over 3000 mL negative not counting recent bolus and perspiration loses, particularly with fever. He is also hypernatremic despite free water administration and tube feeds. His output for the shift so far has 60 mL and had 100 mL on bladder scan recently. CVP was recently measured at 3-4 mmHg, so I will order another 0.45% bolus and albumin 25 gr IV. If no improvement on BP measurements will need more pressor support. Prognosis remains poor in the setting of multiorgan failure.   Tennis Must, MD

## 2019-08-06 NOTE — Progress Notes (Signed)
Nutrition Follow-up  DOCUMENTATION CODES:   Not applicable  INTERVENTION:   D/C Vital 1.5  Initiate Pivot 1.5 @ 20 ml/hr and increase by 10 ml every 8 hours to goal rate of 40 ml/hr (960 ml/day) via OG tube 60 ml Prostat TID  Provides: 2040 kcal, 180 grams protein, and 728 ml free water. Total free water: 1478 ml    NUTRITION DIAGNOSIS:   Inadequate oral intake related to inability to eat(pt sedated and ventilated) as evidenced by NPO status.  Ongoing.   GOAL:   Provide needs based on ASPEN/SCCM guidelines  Progressing.   MONITOR:   Vent status, Labs, Weight trends, TF tolerance, Skin, I & O's  REASON FOR ASSESSMENT:   Consult Enteral/tube feeding initiation and management  ASSESSMENT:   68 y/o male admitted with PNA and COVID 19  Pt discussed during ICU rounds and with RN.  Per chart review pt's TF turned off 10/10 for possible gi bleed (ileus and obstruction ruled out) and OG tube changed to suction with maroon output and black stool. Per RN pt tolerating TF at 20 and can advance today.   10/14 CT inserted for large pneumothorax, HD cath placed for initiation of CVVHD - renal consulted    Patient is currently intubated on ventilator support MV: 11.6 L/min Temp (24hrs), Avg:101.1 F (38.4 C), Min:99.9 F (37.7 C), Max:102.5 F (39.2 C) MAP: a-line: 58-54-56  Medications reviewed and include: solu-cortef, SSI, miralax, senokot, fentanyl and versed for sedation Levophed @ 40 mcg  Vasopressin @ .03 units  125 ml free water every 4 hours = 750 ml  Labs reviewed: Na 149 (H), BUN/Cr 164/4.25 CBG: 228-222 16 F OG tube R Chest tube: 1550 ml out    TF: Vital 1.5 @ 20 ml/hr  NUTRITION - FOCUSED PHYSICAL EXAM:  Deferred; RD working remotely   Diet Order:   Diet Order            Diet NPO time specified  Diet effective now              EDUCATION NEEDS:   No education needs have been identified at this time  Skin:  Skin Assessment: Reviewed RN  Assessment  Last BM:  10/11 large  Height:   Ht Readings from Last 1 Encounters:  07/26/19 5\' 10"  (1.778 m)    Weight:   Wt Readings from Last 1 Encounters:  08/06/19 87.6 kg    Ideal Body Weight:  75.45 kg  BMI:  Body mass index is 27.71 kg/m.  Estimated Nutritional Needs:   Kcal:  2046  Protein:  175-219 grams  Fluid:  >1.9L/day  [name] RD, [place], [place] Pager [phone] After Hours Pager

## 2019-08-06 NOTE — Progress Notes (Addendum)
NAME:  [name], MRN:  [id number], DOB:  1950-11-18, LOS: 12 ADMISSION DATE:  07/25/2019, CONSULTATION DATE:  10/3 REFERRING MD:  Bridgett Larsson, CHIEF COMPLAINT:  Dyspnea   Brief History   68 y/o male admitted on 10/3 from the [facility] ED with ARDS from COVID pneumonia leading to need for mechanical ventilation.    Past Medical History  COVID 19 Heartburn  Significant Hospital Events   10/3 admission 10/10 weaned 12 hours 10/11 dark stool  Consults:  PCCM  Procedures:  10/2 ETT>  10/2 R IJ CVL > 10/11 10/13 R chest tube >  10/14 R subclavian CVL >  10/14 L IJ CVL >   Significant Diagnostic Tests:  10/2 CT angiogram > personally reviewed, extensive bilateral airspace disease predominantly posterior  Micro Data:  9/20 SARS COV 2 > POSITIVE 10/2 blood >  10/9 blood > MSSA 2/4 10/9 urine >  10/9 resp > MSSA, psuedomonas  10/13 blood >  10/14 blood >   Antimicrobials/COVID Rx  10/3 remdesivir > 10/6 10/3 actemra 10/3 decadron > 10/4 convalescent plasma, repeat 10/6  10/9 vanc > 10/10 10/9 cefepime > 10/10 10/10 Ancef >  10/13 Cefepime >  10/14 anidulofungin>   Interim history/subjective:   Pneumothorax yesterday, chest tube placed Decompensated overnight, CVL placed Worsening vasopressor needs Renal function worse  Objective   Blood pressure 103/68, pulse (!) 111, temperature 99.9 F (37.7 C), temperature source Esophageal, resp. rate 18, height 5\' 10"  (1.778 m), weight 87.6 kg, SpO2 90 %. CVP:  [3 mmHg-6 mmHg] 6 mmHg  Vent Mode: PCV FiO2 (%):  [70 %-100 %] 100 % Set Rate:  [15 bmp-24 bmp] 24 bmp Vt Set:  [480 mL] 480 mL PEEP:  [10 cmH20] 10 cmH20 Plateau Pressure:  [24 cmH20-26 cmH20] 24 cmH20   Intake/Output Summary (Last 24 hours) at 08/06/2019 0752 Last data filed at 08/06/2019 0600 Gross per 24 hour  Intake 3925.21 ml  Output 2035 ml  Net 1890.21 ml   Filed Weights   08/02/19 0500 08/03/19 0500 08/06/19 0500  Weight: 92.8 kg 90 kg 87.6 kg     Examination:  General:  In bed on vent HENT: NCAT ETT in place PULM: Rhonchi on R B, vent supported breathing CV: RRR, no mgr GI: BS+, soft, nontender MSK: normal bulk and tone Neuro: sedated on vent   10/14 CXR images personally reviewed: R pigtail, chest tube in place  Resolved Hospital Problem list     Assessment & Plan:  Severe ARDS due to COVID 19 pneumonia: improving oxygenation and ventilator mechanics Continue mechanical ventilation per ARDS protocol Target TVol 6-8cc/kgIBW Target Plateau Pressure < 30cm H20 Target driving pressure less than 15 cm of water Target PaO2 55-65: titrate PEEP/FiO2 per protocol As long as PaO2 to FiO2 ratio is less than 1:150 position in prone position for 16 hours a day Check CVP daily if CVL in place Target CVP less than 4, diurese as necessary Ventilator associated pneumonia prevention protocol 10/14 no proning given instability, chest tube in place, continue full vent support  Pneumothorax on R with large air leak Chest tube to -20 cm Suction Consider replacement with large bore tube given apparent high flow rate of air leak  AKI: worsening, oliguric Place HD cath CVVHD likely necessary today> consult renal  Anticoagulation/DVT prevention in setting of thrombophilia from COVID 19 Slow GI bleed? Continue protonix bid Monitor CBC Sub q heparin for dvt prophylaxis  MSSA bacteremia: Septic shock 10/13> pseudomonas pneumonia? Other systemic infection? Fungal?  F/u repeat blood cultures Agree with broad coverage Add anidulofungin   Need for sedation/mechanical ventilation: severe agitation when weaning sedation RASS target -1 to -2 Oxycodone, seroquel Continue fentanyl/versed infusions  Best practice:  Diet: tube feeding Pain/Anxiety/Delirium protocol (if indicated): as above VAP protocol (if indicated): yes DVT prophylaxis: as above GI prophylaxis: famotidine Glucose control: SSI Mobility: bed rest Code Status: full  Family Communication: per [facility] Disposition: remain in ICU  Labs   CBC: Recent Labs  Lab 08/03/19 1035 08/03/19 1640  08/04/19 0535  08/05/19 0550 08/05/19 1020 08/06/19 0004 08/06/19 0053 08/06/19 0446 08/06/19 0610  WBC 18.1* 20.3*  --  26.3*  --  31.9*  --   --   --   --  14.8*  HGB 11.0* 11.3*   < > 11.3*   < > 11.9* 11.6* 11.9* 11.9* 10.2* 9.8*  HCT 35.2* 35.6*   < > 36.5*   < > 38.7* 34.0* 35.0* 35.0* 30.0* 32.9*  MCV 98.1 97.8  --  99.5  --  99.7  --   --   --   --  103.1*  PLT 300 289  --  373  --  395  --   --   --   --  259   < > = values in this interval not displayed.    Basic Metabolic Panel: Recent Labs  Lab 08/01/19 0500  08/02/19 0457  08/03/19 1035  08/04/19 0535  08/05/19 0550 08/05/19 1020 08/06/19 0004 08/06/19 0053 08/06/19 0446  NA 146*   < > 145   < > 144   < > 148*   < > 149* 148* 146* 147* 147*  K 4.1   < > 4.3   < > 4.8   < > 5.0   < > 4.3 4.4 4.5 4.3 4.3  CL 93*  --  99  --  98  --  100  --  103  --   --   --   --   CO2 35*  --  35*  --  35*  --  34*  --  29  --   --   --   --   GLUCOSE 144*  --  157*  --  134*  --  130*  --  186*  --   --   --   --   BUN 77*  --  74*  --  99*  --  104*  --  129*  --   --   --   --   CREATININE 1.96*  --  1.54*  --  1.61*  --  2.09*  --  2.78*  --   --   --   --   CALCIUM 9.2  --  9.4  --  9.4  --  9.2  --  8.8*  --   --   --   --   MG 2.6*  --  3.1*  --   --   --   --   --   --   --   --   --   --   PHOS 5.7*  --  3.8  --   --   --   --   --   --   --   --   --   --    < > = values in this interval not displayed.   GFR: Estimated Creatinine Clearance: 29.1 mL/min (A) (by C-G formula based on SCr of  2.78 mg/dL (H)). Recent Labs  Lab 08/01/19 0943  08/03/19 1640 08/04/19 0535 08/05/19 0550 08/06/19 0610  PROCALCITON 2.20  --   --   --   --   --   WBC  --    < > 20.3* 26.3* 31.9* 14.8*   < > = values in this interval not displayed.    Liver Function Tests: Recent Labs  Lab 08/01/19 0500 08/02/19  0457 08/03/19 1035 08/04/19 0535 08/05/19 0550  AST 56* 31 21 27 31   ALT 59* 38 28 22 19   ALKPHOS 85 91 94 121 104  BILITOT 0.7 0.7 0.7 0.5 0.6  PROT 6.3* 6.5 6.5 6.8 6.8  ALBUMIN 2.9* 3.7 3.5 3.5 3.5   No results for input(s): LIPASE, AMYLASE in the last 168 hours. No results for input(s): AMMONIA in the last 168 hours.  ABG    Component Value Date/Time   PHART 7.303 (L) 08/06/2019 0446   PCO2ART 57.6 (H) 08/06/2019 0446   PO2ART 63.0 (L) 08/06/2019 0446   HCO3 28.0 08/06/2019 0446   TCO2 30 08/06/2019 0446   ACIDBASEDEF 1.0 07/27/2019 0546   O2SAT 86.0 08/06/2019 0446     Coagulation Profile: No results for input(s): INR, PROTIME in the last 168 hours.  Cardiac Enzymes: No results for input(s): CKTOTAL, CKMB, CKMBINDEX, TROPONINI in the last 168 hours.  HbA1C: Hgb A1c MFr Bld  Date/Time Value Ref Range Status  07/26/2019 05:00 AM 6.1 (H) 4.8 - 5.6 % Final    Comment:    (NOTE) Pre diabetes:          5.7%-6.4% Diabetes:              >6.4% Glycemic control for   <7.0% adults with diabetes     CBG: Recent Labs  Lab 08/05/19 1157 08/05/19 1548 08/05/19 1922 08/06/19 0120 08/06/19 0400  GLUCAP 192* 244* 194* 169* 167*     Critical care time: 35 minutes     Roselie Awkward, MD [facility]M Pager: [phone] Cell: [phone] If no response, call [phone]

## 2019-08-06 NOTE — Progress Notes (Addendum)
PROGRESS NOTE    [name]  EYC:[id number] DOB: 01-26-1951 DOA: 07/25/2019 PCP: Albert Drown, MD   Brief Narrative:  68 year old BM PMHx GERD, BPH   Presented to [facility] ER with shortness of breath and hypoxia.  Patient was diagnosed with COVID 19 on July 13 9019.  Patient's wife also has COVID-19 but is asymptomatic.  Over the last 1 to 2 days, the patient's shortness of breath is increased.  Patient has been suffering with anorexia.  Patient's wife was actually having a conversation via teleconference with the patient's PCP.  PCP is also otherwise physician.  He had actually called to check on how the wife was doing.  The wife had mentioned to the physician that the patient has been laying in bed.  When the physician listened to how the patient was speaking over the phone, it was noted the patient was tachypneic.  EMS was activated.  When EMS arrived, the patient's O2 saturations were apparently in the 40 percentile range.  Patient was placed on nonrebreather.  He was transferred to the ER.  In the ER, his oxygen saturations on 100% nonrebreather were only in the 80s.  Patient was confused.  Patient was emergently intubated.  Patient given IV steroids.  Initial chest x-ray demonstrates diffuse multifocal infiltrates.  CT scan of the chest was negative for PE.  ABG prior to leaving for [place] demonstrates a pH 7.41 PCO2 44 PO2 of 87 on FiO2 100%.  AA gradient calculates out to 571.  I personally called the patient's wife on the evening of July 25, 2019.  During that conversation we had discussed using IV steroids, IV remdesivir.  We also discussed off label use of Actemra and possibly confluence of plasma due to the patient's severe COVID-19 pneumonia.  She verbally agreed that the medical team should try anything possible to make the patient better.  Confirm with the wife the patient is a full code.    ED Course: emergently intubated. Central line placed. Started on  IV Decadron.    Subjective: 10/14 intubated/sedated.  T-max last 24 hours 39.2 C.    Assessment & Plan:   Principal Problem:   Pneumonia due to COVID-19 virus Active Problems:   BPH (benign prostatic hyperplasia)   Acute respiratory failure due to COVID-19 (HCC)   AKI (acute kidney injury) (HCC)   Fluid overload   Pneumothorax on right   Acute renal failure (ARF) (HCC)   GI bleed  COVID pneumonia/acute respiratory stress with hypoxia/bacterial superinfection - Continue Decadron - Continue Remdesivir per pharmacy protocol - 10/2 Actemra x1 dose - 10/3 counseled patient wife on CCP over phone and she has agreed to allow [place] to administer CCP.  Paperwork in chart  -10/3 transfuse 1 unit CCP - Continue prone patient> 16 hours/day if patient tolerates.  If not prone patient for 2 to 3 hours every shift. -10/3 patient enrolled in [place]  Lab 08/02/19 0457 08/03/19 1035 08/04/19 0535 08/05/19 0550 08/06/19 0610  CRP 2.6* 1.1* 0.8 <0.8 4.6*   Recent Labs  Lab 08/02/19 0457 08/03/19 1035 08/04/19 0535 08/05/19 0550 08/06/19 0610  DDIMER 10.19* 3.91* 5.44* 6.58* 2.98*  - Titrate patient's vent settings to maintain SPO2 88 to 93%, if possible.  Will use ARDS lung protective protocol (6 to 8 cc/kg/min). - Attempt to maintain plateau pressure<30 - Attempt to maintain driving pressure 15 - 10/5 PF ratio= 170 while prone - 10/6 PCXR pending - 10/6 transfuse  1 unit CCP (this is patient's second unit) -10/10 MSSA bacteremia.  Antibiotics initiated per ID recommendations -10/13 PCXR large RIGHT sided pneumothorax; s/p chest tube placement with reinflation of lung.  High-volume leak (questionable minimal leak at insertion site improved with Vaseline gauze) -10/14 continue broad-spectrum antibiotic and antifungal added -10/14 stress dose steroids added  GI bleed -H/H was stable x72 hours Recent Labs  Lab 08/05/19 1020 08/06/19 0004 08/06/19 0053  08/06/19 0446 08/06/19 0610  HGB 11.6* 11.9* 11.9* 10.2* 9.8*  -Hemoglobin trending down over last 24 hours. -Occult blood pending -10/14 Heparin SQ (hold).  Reevaluate restarting on 10/15.  Consider CT scan abdomen and pelvis if stable enough for transport  Pneumoperitoneum? -Per [name] radiology contacted him believes has pneumoperitoneum  Fluid overload - Strict in and out -1.3 L -Daily weight Filed Weights   08/02/19 0500 08/03/19 0500 08/06/19 0500  Weight: 92.8 kg 90 kg 87.6 kg  - CVP daily -10/14 CVP= 6  Acute renal failure - Avoid nephrotoxic medication Recent Labs  Lab 08/02/19 0457 08/03/19 1035 08/04/19 0535 08/05/19 0550 08/06/19 0610  CREATININE 1.54* 1.61* 2.09* 2.78* 4.25*  -10/14 renal ultrasound pending -SPEP, UPEP, complements, c-ANCA, urine electrolytes, urine osmolality, serum osmolality, renal ultrasound pending - Spoke with [name] nephrology will see patient first thing in the a.m.  Metabolic acidosis -Sodium bicarb drip 178ml/hr -Should also help with renal failure   BPH -Not on home meds.  But be cognizant of urinary retention    Goals of care - CCP use; counseled [name] that CCP is not improving therapy for COVID-19 though available data shows it may have benefit with high titer plasma is given.  Is unclear if it is effective other patients and with use of low titer plasma.  Risk and benefits of its use were explained to [name] and she agreed to treatment..  All appropriate IRB consent paperwork has been signed and is in patient's chart.   DVT prophylaxis: Lovenox (ICU dose) Code Status: Full Family Communication: 10/14 Dr. Kathalene [name] spoke with family.  Also spoke with wife [name] and obtain permission to make patient DNR, after going over in detail patient's multisystem organ failure. Disposition Plan: TBD   Consultants:  PCCM   Procedures/Significant Events:  10/2 PCXR;-Endotracheal tube remains with tip above the  thoracic inlet. Recommend repositioning and advancement. Esophagogastric tube with tip and side port below the diaphragm. - Unchanged diffuse, extensive bilateral heterogeneous opacity, consistent with multifocal infection, edema, and/or ARDS. 10/3 PCXR; slightly improved extensive bilateral heterogeneous opacifications (my read) 10/3 transfuse 1 unit CCP 10/6 transfuse 1 unit CCP (this is patient's second unit) 10/10 PCXR - Personally reviewed: Poor inspiration - mild improvement in aeration 10/11 PCXR - Personally reviewed: Left lower lobe appears to be improving; RLL stable 10/13 PCXR - Personally reviewed: Large R sided pneumothorax - re-expanded after chest tube placed overnight 10/14 PCXR;-small residual right apical pneumothorax, significantly decreased. -Stable extensive patchy opacities throughout both lungs  I have personally reviewed and interpreted all radiology studies and my findings are as above.  VENTILATOR SETTINGS: Vent mode; PCV Vt Set;  Set rate; 24 FiO2; 100% I time;  PEEP; 10     Cultures 9/29 Novel coronavirus positive 10/2 blood LEFT antecubital NGTD 10/2 blood hand NGTD 10/3 MRSA by PCR negative     Antimicrobials: Anti-infectives (From admission, onward)   Start     Stop   08/07/19 1000  anidulafungin (ERAXIS) 100 mg in sodium chloride 0.9 % 100  mL IVPB         08/07/19 0800  ceFEPIme (MAXIPIME) 2 g in sodium chloride 0.9 % 100 mL IVPB         08/06/19 1000  anidulafungin (ERAXIS) 200 mg in sodium chloride 0.9 % 200 mL IVPB    Note to Pharmacy: please   08/06/19 1514   08/06/19 0800  ceFEPIme (MAXIPIME) 2 g in sodium chloride 0.9 % 100 mL IVPB  Status:  Discontinued     08/06/19 1036   08/02/19 2200  ceFAZolin (ANCEF) IVPB 2g/100 mL premix  Status:  Discontinued     08/06/19 0733   08/02/19 1400  vancomycin (VANCOCIN) 1,250 mg in sodium chloride 0.9 % 250 mL IVPB  Status:  Discontinued     08/02/19 1332   08/01/19 1330  vancomycin  (VANCOCIN) 2,000 mg in sodium chloride 0.9 % 500 mL IVPB     08/01/19 1636   08/01/19 1300  ceFEPIme (MAXIPIME) 2 g in sodium chloride 0.9 % 100 mL IVPB  Status:  Discontinued     08/02/19 1332   07/26/19 1600  remdesivir 100 mg in sodium chloride 0.9 % 250 mL IVPB     07/29/19 1823   07/26/19 0215  remdesivir 200 mg in sodium chloride 0.9 % 250 mL IVPB     07/26/19 0520       Devices    LINES / TUBES:  #7.5 ETT 10/2>> RIGHT IJ CVL>>> RIGHT chest tube 10/13>>> LEFT Ig HD cath 10/14>>   Continuous Infusions:  sodium chloride Stopped (08/01/19 0957)   sodium chloride 10 mL/hr at 08/06/19 0800   [START ON 08/07/2019] anidulafungin     [START ON 08/07/2019] ceFEPime (MAXIPIME) IV     epinephrine     feeding supplement (PIVOT 1.5 CAL)     fentaNYL infusion INTRAVENOUS 50 mcg/hr (08/06/19 0800)   lactated ringers     midazolam Stopped (08/05/19 0737)   norepinephrine (LEVOPHED) Adult infusion 40 mcg/min (08/06/19 0800)   phenylephrine (NEO-SYNEPHRINE) Adult infusion 20 mcg/min (08/06/19 1522)   vasopressin (PITRESSIN) infusion - *FOR SHOCK* 0.03 Units/min (08/06/19 0800)     Objective: Vitals:   08/06/19 0755 08/06/19 1211 08/06/19 1500 08/06/19 1600  BP: (!) 92/52 103/76 (!) 88/47   Pulse: (!) 109 (!) 121 (!) 120   Resp: (!) 32 (!) 30 (!) 30   Temp:    99.3 F (37.4 C)  TempSrc:    Oral  SpO2: 90% (!) 88% (!) 87%   Weight:      Height:        Intake/Output Summary (Last 24 hours) at 08/06/2019 1636 Last data filed at 08/06/2019 0800 Gross per 24 hour  Intake 3597.51 ml  Output 1125 ml  Net 2472.51 ml   Filed Weights   08/02/19 0500 08/03/19 0500 08/06/19 0500  Weight: 92.8 kg 90 kg 87.6 kg   Physical Exam:  General: Intubated/sedated positive acute respiratory distress Eyes: negative scleral hemorrhage, negative anisocoria, negative icterus ENT: Negative Runny nose, negative gingival bleeding, #7.5 ETT tube in place Neck:  Negative scars,  masses, torticollis, lymphadenopathy, JVD Lungs: RIGHT rhonchi/crackles (airleak from pigtail cath), without wheezes or crackles Cardiovascular:, Tachycardic, without murmur gallop or rub normal S1 and S2.  Right pigtail catheter in place positive air leak. Abdomen: negative abdominal pain, nondistended, positive soft, bowel sounds, no rebound, no ascites, no appreciable mass Extremities: No significant cyanosis, clubbing, or edema bilateral lower extremities Skin: Negative rashes, lesions, ulcers Psychiatric: Intubated/sedated Central nervous system: Intubated/sedated  Data Reviewed: Care during the described time interval was provided by me .  I have reviewed this patient's available data, including medical history, events of note, physical examination, and all test results as part of my evaluation.   CBC: Recent Labs  Lab 08/03/19 1035 08/03/19 1640  08/04/19 0535  08/05/19 0550 08/05/19 1020 08/06/19 0004 08/06/19 0053 08/06/19 0446 08/06/19 0610  WBC 18.1* 20.3*  --  26.3*  --  31.9*  --   --   --   --  14.8*  HGB 11.0* 11.3*   < > 11.3*   < > 11.9* 11.6* 11.9* 11.9* 10.2* 9.8*  HCT 35.2* 35.6*   < > 36.5*   < > 38.7* 34.0* 35.0* 35.0* 30.0* 32.9*  MCV 98.1 97.8  --  99.5  --  99.7  --   --   --   --  103.1*  PLT 300 289  --  373  --  395  --   --   --   --  259   < > = values in this interval not displayed.   Basic Metabolic Panel: Recent Labs  Lab 08/01/19 0500  08/02/19 0457  08/03/19 1035  08/04/19 0535  08/05/19 0550 08/05/19 1020 08/06/19 0004 08/06/19 0053 08/06/19 0446 08/06/19 0610  NA 146*   < > 145   < > 144   < > 148*   < > 149* 148* 146* 147* 147* 149*  K 4.1   < > 4.3   < > 4.8   < > 5.0   < > 4.3 4.4 4.5 4.3 4.3 4.2  CL 93*  --  99  --  98  --  100  --  103  --   --   --   --  108  CO2 35*  --  35*  --  35*  --  34*  --  29  --   --   --   --  28  GLUCOSE 144*  --  157*  --  134*  --  130*  --  186*  --   --   --   --  229*  BUN 77*  --   74*  --  99*  --  104*  --  129*  --   --   --   --  164*  CREATININE 1.96*  --  1.54*  --  1.61*  --  2.09*  --  2.78*  --   --   --   --  4.25*  CALCIUM 9.2  --  9.4  --  9.4  --  9.2  --  8.8*  --   --   --   --  7.6*  MG 2.6*  --  3.1*  --   --   --   --   --   --   --   --   --   --   --   PHOS 5.7*  --  3.8  --   --   --   --   --   --   --   --   --   --   --    < > = values in this interval not displayed.   GFR: Estimated Creatinine Clearance: 19.1 mL/min (A) (by C-G formula based on SCr of 4.25 mg/dL (H)). Liver Function Tests: Recent Labs  Lab 08/02/19 0457 08/03/19 1035 08/04/19 0535 08/05/19 0550 08/06/19 0610  AST  31 21 27 31 22   ALT 38 28 22 19 9   ALKPHOS 91 94 121 104 45  BILITOT 0.7 0.7 0.5 0.6 1.0  PROT 6.5 6.5 6.8 6.8 5.3*  ALBUMIN 3.7 3.5 3.5 3.5 3.2*   No results for input(s): LIPASE, AMYLASE in the last 168 hours. No results for input(s): AMMONIA in the last 168 hours. Coagulation Profile: No results for input(s): INR, PROTIME in the last 168 hours. Cardiac Enzymes: No results for input(s): CKTOTAL, CKMB, CKMBINDEX, TROPONINI in the last 168 hours. BNP (last 3 results) No results for input(s): PROBNP in the last 8760 hours. HbA1C: No results for input(s): HGBA1C in the last 72 hours. CBG: Recent Labs  Lab 08/05/19 1922 08/06/19 0120 08/06/19 0400 08/06/19 0803 08/06/19 1309  GLUCAP 194* 169* 167* 228* 222*   Lipid Profile: No results for input(s): CHOL, HDL, LDLCALC, TRIG, CHOLHDL, LDLDIRECT in the last 72 hours. Thyroid Function Tests: No results for input(s): TSH, T4TOTAL, FREET4, T3FREE, THYROIDAB in the last 72 hours. Anemia Panel: Recent Labs    08/05/19 0550 08/06/19 0610  FERRITIN 1,072* 681*   Urine analysis:    Component Value Date/Time   COLORURINE YELLOW 08/01/2019 1745   APPEARANCEUR HAZY (A) 08/01/2019 1745   LABSPEC 1.023 08/01/2019 1745   PHURINE 5.0 08/01/2019 1745   GLUCOSEU NEGATIVE 08/01/2019 1745   HGBUR NEGATIVE  08/01/2019 1745   BILIRUBINUR NEGATIVE 08/01/2019 1745   KETONESUR NEGATIVE 08/01/2019 1745   PROTEINUR NEGATIVE 08/01/2019 1745   NITRITE NEGATIVE 08/01/2019 1745   LEUKOCYTESUR NEGATIVE 08/01/2019 1745   Sepsis Labs: @LABRCNTIP (procalcitonin:4,lacticidven:4)  ) Recent Results (from the past 240 hour(s))  Culture, blood (Routine X 2) w Reflex to ID Panel     Status: Abnormal   Collection Time: 08/01/19 12:15 PM   Specimen: BLOOD  Result Value Ref Range Status   Specimen Description   Final    BLOOD RIGHT ARM Performed at [facility], [place] [address], [place], [place] [address]    Special Requests   Final    BOTTLES DRAWN AEROBIC AND ANAEROBIC Blood Culture adequate volume Performed at [facility] [address], [place], [place] [address]    Culture  Setup Time   Final    GRAM POSITIVE COCCI IN CLUSTERS IN BOTH AEROBIC AND ANAEROBIC BOTTLES CRITICAL RESULT CALLED TO, READ BACK BY AND VERIFIED WITH: C. SHADE PHARMD, AT 8756 08/02/19 BY Rush [place] Performed at [facility], [place] [address], [place], [place] [address]    Culture STAPHYLOCOCCUS AUREUS (A)  Final   Report Status 08/04/2019 FINAL  Final   Organism ID, Bacteria STAPHYLOCOCCUS AUREUS  Final      Susceptibility   Staphylococcus aureus - MIC*    CIPROFLOXACIN <=0.5 SENSITIVE Sensitive     ERYTHROMYCIN <=0.25 SENSITIVE Sensitive     GENTAMICIN <=0.5 SENSITIVE Sensitive     OXACILLIN 0.5 SENSITIVE Sensitive     TETRACYCLINE <=1 SENSITIVE Sensitive     VANCOMYCIN 1 SENSITIVE Sensitive     TRIMETH/SULFA <=10 SENSITIVE Sensitive     CLINDAMYCIN <=0.25 SENSITIVE Sensitive     RIFAMPIN <=0.5 SENSITIVE Sensitive     Inducible Clindamycin NEGATIVE Sensitive     * STAPHYLOCOCCUS AUREUS  Blood Culture ID Panel (Reflexed)     Status: Abnormal   Collection Time: 08/01/19 12:15 PM  Result Value Ref Range Status   Enterococcus species NOT DETECTED NOT DETECTED Final   Listeria  monocytogenes NOT DETECTED NOT DETECTED Final  Staphylococcus species DETECTED (A) NOT DETECTED Final    Comment: CRITICAL RESULT CALLED TO, READ BACK BY AND VERIFIED WITH: C. SHADE PHARMD, AT 4580 08/02/19 BY D. VANHOOK    Staphylococcus aureus (BCID) DETECTED (A) NOT DETECTED Final    Comment: Methicillin (oxacillin) susceptible Staphylococcus aureus (MSSA). Preferred therapy is anti staphylococcal beta lactam antibiotic (Cefazolin or Nafcillin), unless clinically contraindicated. CRITICAL RESULT CALLED TO, READ BACK BY AND VERIFIED WITH: C. SHADE PHARMD, AT 9983 08/02/19 BY D. VANHOOK    Methicillin resistance NOT DETECTED NOT DETECTED Final   Streptococcus species NOT DETECTED NOT DETECTED Final   Streptococcus agalactiae NOT DETECTED NOT DETECTED Final   Streptococcus pneumoniae NOT DETECTED NOT DETECTED Final   Streptococcus pyogenes NOT DETECTED NOT DETECTED Final   Acinetobacter baumannii NOT DETECTED NOT DETECTED Final   Enterobacteriaceae species NOT DETECTED NOT DETECTED Final   Enterobacter cloacae complex NOT DETECTED NOT DETECTED Final   Escherichia coli NOT DETECTED NOT DETECTED Final   Klebsiella oxytoca NOT DETECTED NOT DETECTED Final   Klebsiella pneumoniae NOT DETECTED NOT DETECTED Final   Proteus species NOT DETECTED NOT DETECTED Final   Serratia marcescens NOT DETECTED NOT DETECTED Final   Haemophilus influenzae NOT DETECTED NOT DETECTED Final   Neisseria meningitidis NOT DETECTED NOT DETECTED Final   Pseudomonas aeruginosa NOT DETECTED NOT DETECTED Final   Candida albicans NOT DETECTED NOT DETECTED Final   Candida glabrata NOT DETECTED NOT DETECTED Final   Candida krusei NOT DETECTED NOT DETECTED Final   Candida parapsilosis NOT DETECTED NOT DETECTED Final   Candida tropicalis NOT DETECTED NOT DETECTED Final    Comment: Performed at [facility], [place] [address] [place], [place] [address]  Culture, blood (Routine X 2) w Reflex to ID Panel     Status:  Abnormal   Collection Time: 08/01/19 12:21 PM   Specimen: BLOOD  Result Value Ref Range Status   Specimen Description   Final    BLOOD RIGHT HAND Performed at [facility] [address] [place], [place] [address]    Special Requests   Final    BOTTLES DRAWN AEROBIC AND ANAEROBIC Blood Culture adequate volume Performed at [facility] [address], [place], [place] [address]    Culture  Setup Time   Final    GRAM POSITIVE COCCI IN CLUSTERS IN BOTH AEROBIC AND ANAEROBIC BOTTLES CRITICAL RESULT CALLED TO, READ BACK BY AND VERIFIED WITH: C. SHADE PHARMD, AT 3790 08/02/19 BY D. VANHOOK    Culture (A)  Final    STAPHYLOCOCCUS AUREUS SUSCEPTIBILITIES PERFORMED ON PREVIOUS CULTURE WITHIN THE LAST 5 DAYS. Performed at [facility], [place] [address] [place], [place] [address]    Report Status 08/04/2019 FINAL  Final  Culture, respiratory     Status: None   Collection Time: 08/01/19  3:30 PM   Specimen: Tracheal Aspirate  Result Value Ref Range Status   Specimen Description   Final    TRACHEAL ASPIRATE Performed at [facility] [address] [place], [place] [address]    Special Requests   Final    NONE Performed at [facility], [place] [address] [place], [place] [address]    Gram Stain   Final    RARE WBC PRESENT, PREDOMINANTLY PMN ABUNDANT GRAM POSITIVE COCCI IN CLUSTERS Performed at [facility], [place] [address] [place], [place] [address]    Culture   Final    MODERATE STAPHYLOCOCCUS AUREUS RARE  PSEUDOMONAS AERUGINOSA    Report Status 08/06/2019 FINAL  Final   Organism ID, Bacteria STAPHYLOCOCCUS AUREUS  Final   Organism ID, Bacteria PSEUDOMONAS AERUGINOSA  Final      Susceptibility   Pseudomonas aeruginosa - MIC*    CEFTAZIDIME 4 SENSITIVE Sensitive     CIPROFLOXACIN 0.5 SENSITIVE Sensitive     GENTAMICIN <=1 SENSITIVE Sensitive     IMIPENEM 1 SENSITIVE Sensitive     PIP/TAZO  16 SENSITIVE Sensitive     CEFEPIME 4 SENSITIVE Sensitive     * RARE PSEUDOMONAS AERUGINOSA   Staphylococcus aureus - MIC*    CIPROFLOXACIN <=0.5 SENSITIVE Sensitive     ERYTHROMYCIN <=0.25 SENSITIVE Sensitive     GENTAMICIN <=0.5 SENSITIVE Sensitive     OXACILLIN 0.5 SENSITIVE Sensitive     TETRACYCLINE <=1 SENSITIVE Sensitive     VANCOMYCIN <=0.5 SENSITIVE Sensitive     TRIMETH/SULFA <=10 SENSITIVE Sensitive     CLINDAMYCIN <=0.25 SENSITIVE Sensitive     RIFAMPIN <=0.5 SENSITIVE Sensitive     Inducible Clindamycin NEGATIVE Sensitive     * MODERATE STAPHYLOCOCCUS AUREUS  Culture, Urine     Status: Abnormal   Collection Time: 08/01/19  5:45 PM   Specimen: Urine, Catheterized  Result Value Ref Range Status   Specimen Description   Final    URINE, CATHETERIZED Performed at [facility] [address] [place], [place] [address]    Special Requests   Final    NONE Performed at [facility], [place] [address], [place], [place] [address]    Culture (A)  Final    <10,000 COLONIES/mL INSIGNIFICANT GROWTH Performed at [facility] [address], [place], [place] [address]    Report Status 08/03/2019 FINAL  Final  MRSA PCR Screening     Status: None   Collection Time: 08/02/19 10:02 AM   Specimen: Nasal Mucosa; Nasopharyngeal  Result Value Ref Range Status   MRSA by PCR NEGATIVE NEGATIVE Final    Comment:        The GeneXpert MRSA Assay (FDA approved for NASAL specimens only), is one component of a comprehensive MRSA colonization surveillance program. It is not intended to diagnose MRSA infection nor to guide or monitor treatment for MRSA infections. Performed at [facility], [place] [address] [place], [place] [address]          Radiology Studies: Dg Chest Port 1 View  Result Date: 08/06/2019 CLINICAL DATA:  Chest tube, follow-up pneumothorax EXAM: PORTABLE CHEST 1 VIEW COMPARISON:  Chest radiograph from  earlier today. FINDINGS: Endotracheal tube tip is 4.2 cm above the carina. Enteric tube enters stomach with the tip not seen on this image. Left internal jugular central venous catheter terminates in the middle third of the SVC. Right subclavian central venous catheter terminates over the right atrium. Stable right pigtail chest tube with tip in the peripheral lower right pleural space. Separate right basilar chest tube. Stable cardiomediastinal silhouette with normal heart size. Small residual right apical pneumothorax, significantly decreased. No left pneumothorax. Midline mediastinum. No pleural effusion. Extensive patchy opacities throughout both lungs appear unchanged. IMPRESSION: 1. Small residual right apical pneumothorax, significantly decreased. 2. Stable extensive patchy opacities throughout both lungs. 3. Well-positioned support structures as described. Electronically Signed   By: Ilona Sorrel M.D.   On: 08/06/2019 15:25   Dg Chest Port 1 View  Addendum Date: 08/06/2019   ADDENDUM REPORT: 08/06/2019 13:46 ADDENDUM: These results were called by telephone at the  time of interpretation on 08/06/2019 at 1:43 pm to provider Dr. Roselie Awkward, who verbally acknowledged these results. Electronically Signed   By: Aletta Edouard M.D.   On: 08/06/2019 13:46   Result Date: 08/06/2019 CLINICAL DATA:  Replacement of non tunneled dialysis catheter. Respiratory failure secondary to COVID-19 pneumonia. EXAM: PORTABLE CHEST 1 VIEW COMPARISON:  Film earlier today at 1119 hours FINDINGS: Replaced left jugular non tunneled dialysis catheter now projects inferiorly with the tip in the SVC. Other support apparatus stable with the endotracheal tube tip approximately 5 cm above the carina. Right lateral pneumothorax is slightly larger with stable positioning of a lateral pigtail thoracostomy tube. Bilateral severe pneumonia appears stable since the prior chest x-ray. Lucency abutting the right hemidiaphragm may relate  to a component subpulmonic air. However, free intraperitoneal air under the right hemidiaphragm is not entirely excluded by semi-erect chest x-ray. IMPRESSION: 1. Replaced left jugular non tunneled dialysis catheter tip is in the SVC. 2. Slightly larger right lateral pneumothorax. 3. Cannot exclude free air under the right hemidiaphragm. However, the appearance may relate to relative lucency of the lung at the right lung base and component of basilar pneumothorax as well. Electronically Signed: By: Aletta Edouard M.D. On: 08/06/2019 13:41   Dg Chest Port 1 View  Result Date: 08/06/2019 CLINICAL DATA:  COVID.  Insertion of dialysis catheter EXAM: PORTABLE CHEST 1 VIEW COMPARISON:  None. FINDINGS: Left internal jugular dialysis catheter courses across the midline and likely passes into the right innominate vein. Right central line tip is at the cavoatrial junction. Endotracheal tube and NG tube are unchanged. Cardiomegaly. Patchy bilateral airspace opacities again noted, unchanged. Right chest tube remains in place with right basilar pneumothorax, stable. IMPRESSION: Interval placement of left internal jugular Vas-Cath. The tip likely passes into the right innominate vein. No left pneumothorax. Right chest tube remains in place with small right basilar pneumothorax, stable. Stable patchy bilateral airspace opacities. Electronically Signed   By: Rolm Baptise M.D.   On: 08/06/2019 11:56   Dg Chest Port 1 View  Result Date: 08/06/2019 CLINICAL DATA:  68 year old male central line placement. COVID-19. EXAM: PORTABLE CHEST 1 VIEW COMPARISON:  0007 hours today. FINDINGS: Portable AP semi upright view at 0236 hours. The right IJ central line is no longer identified. A right subclavian approach central line now is in place. The tip projects at the level of the right atrium about 2.5 centimeters below the expected level of the cavoatrial junction. ETT tip is at the level the clavicles. Stable visible enteric tube.  Stable right chest tube. Small volume right pneumothorax appears stable along with patchy right mid and lower lung opacity and confluent left lung base opacity. The left upper lobe remains clear. Stable cardiac size and mediastinal contours. IMPRESSION: 1. Right subclavian approach central line placed, tip at the level of the right atrium about 2.5 cm below the cavoatrial junction. 2.  Otherwise stable lines and tubes. 3. Stable small volume right pneumothorax, bilateral pneumonia. Electronically Signed   By: Genevie Ann M.D.   On: 08/06/2019 03:49   Dg Chest Port 1 View  Result Date: 08/06/2019 CLINICAL DATA:  PICC line EXAM: PORTABLE CHEST 1 VIEW COMPARISON:  08/05/2019, 08/03/2019, 08/02/2019 FINDINGS: Endotracheal tube tip is about 3.3 cm superior to the carina. Esophageal tube tip below the diaphragm but non included. Right lower chest tube remains in place. Residual small pneumothorax at the right lower lateral chest, CP angle and lung base. No upper extremity venous catheters are  seen. There is catheter tubing coiled over the right neck/thoracic inlet. Multifocal bilateral consolidations are grossly unchanged, worst in the left base. Stable cardiomediastinal silhouette. IMPRESSION: 1. No upper extremity venous catheter is seen. Possible catheter tubing looped over the right neck 2. Small right lateral and basilar pneumothorax slightly more apparent compared to most recent prior 3. Stable multifocal airspace consolidations. Electronically Signed   By: Donavan Foil M.D.   On: 08/06/2019 00:38   Dg Chest Port 1 View  Result Date: 08/05/2019 CLINICAL DATA:  Acute respiratory failure EXAM: PORTABLE CHEST 1 VIEW COMPARISON:  08/05/2019 FINDINGS: Endotracheal tube appropriately positioned in the mid trachea approximately 4 cm from the carina. Transesophageal tube tip and side port distal to the GE junction, terminating near the gastric antrum. Pigtail right pleural catheter is in stable position. Additional  support devices and monitoring leads overlie the chest. There is a small residual right apical pneumothorax (see annotated image). There is increasing dense bilateral airspace opacity towards the bases which could reflect a combination of atelectasis and residual consolidated lung. No acute osseous or soft tissue abnormality. IMPRESSION: 1. Stable satisfactory positioning of lines and tubes. 2. Small residual right apical pneumothorax. 3. Increasing dense bilateral airspace disease towards the bases, which could reflect a combination of worsening atelectasis and residual consolidated lung. Electronically Signed   By: Lovena Le M.D.   On: 08/05/2019 23:09   Dg Chest Port 1 View  Result Date: 08/05/2019 CLINICAL DATA:  Chest tube. EXAM: PORTABLE CHEST 1 VIEW COMPARISON:  08/05/2019. FINDINGS: Endotracheal tube and NG tube stable position. Right chest tube in stable position. Right pneumothorax is improved from prior exam. Mild residual pneumothorax noted. Bilateral atelectatic changes again noted. No pleural effusion. Thoracic spine scoliosis and degenerative change IMPRESSION: 1.  Lines and tubes including right chest tube in stable position. 2.  Interim partial resolution of right-sided pneumothorax. Electronically Signed   By: Marcello Moores  Register   On: 08/05/2019 09:42   Dg Chest Port 1 View  Result Date: 08/05/2019 CLINICAL DATA:  Mechanically assisted ventilation. EXAM: PORTABLE CHEST 1 VIEW COMPARISON:  August 03, 2019. FINDINGS: Endotracheal and nasogastric tubes are unchanged in position. Stable cardiomediastinal silhouette. There is now interval development of large right pneumothorax resulting in some mediastinal shift to the left suggesting tension pneumothorax. There appears to be complete atelectasis of the right lung. Stable multifocal opacities are noted in the left lung concerning for pneumonia. Bony thorax is unremarkable. IMPRESSION: Interval development of large right pneumothorax with  mediastinal shift to the left suggesting tension pneumothorax. Complete atelectasis of the right lung is noted. Critical Value/emergent results were called by telephone at the time of interpretation on 08/05/2019 at 8:25 am to [place] , who verbally acknowledged these results and stated that a chest tube has already been placed. Electronically Signed   By: Marijo Conception M.D.   On: 08/05/2019 08:25   Dg Chest Port 1 View  Result Date: 08/05/2019 CLINICAL DATA:  Chest tube placement EXAM: PORTABLE CHEST 1 VIEW COMPARISON:  Earlier today FINDINGS: New right-sided chest tube with decreased but still sizable right pneumothorax. I spoke with ICU nurse and the tube is functional and is currently on suction. There is still leftward displacement of the mediastinum, although the right diaphragm is now elevated rather than depressed. Normal heart size. Normal positioning of endotracheal orogastric tubes. Bilateral airspace disease in the setting of known pneumonia. There is improving right lung aeration. IMPRESSION: New right chest tube with decreased but  still large pneumothorax. There is still leftward displacement the mediastinum but right diaphragm elevation has resolved. Electronically Signed   By: Monte Fantasia M.D.   On: 08/05/2019 07:46        Scheduled Meds:  sodium chloride   Intravenous Once   chlorhexidine  15 mL Mouth/Throat BID   Chlorhexidine Gluconate Cloth  6 each Topical Daily   feeding supplement (PRO-STAT SUGAR FREE 64)  60 mL Per Tube TID   free water  125 mL Per Tube Q4H   hydrocortisone sodium succinate  50 mg Intravenous Q6H   insulin aspart  0-9 Units Subcutaneous Q4H   mouth rinse  15 mL Mouth Rinse 10 times per day   oxyCODONE  5 mg Oral Q6H   pantoprazole  40 mg Intravenous Q12H   polyethylene glycol  17 g Per Tube BID   QUEtiapine  25 mg Oral BID   sennosides  5 mL Per Tube BID   tamsulosin  0.4 mg Oral BID   Continuous Infusions:   sodium chloride Stopped (08/01/19 0957)   sodium chloride 10 mL/hr at 08/06/19 0800   [START ON 08/07/2019] anidulafungin     [START ON 08/07/2019] ceFEPime (MAXIPIME) IV     epinephrine     feeding supplement (PIVOT 1.5 CAL)     fentaNYL infusion INTRAVENOUS 50 mcg/hr (08/06/19 0800)   lactated ringers     midazolam Stopped (08/05/19 0737)   norepinephrine (LEVOPHED) Adult infusion 40 mcg/min (08/06/19 0800)   phenylephrine (NEO-SYNEPHRINE) Adult infusion 20 mcg/min (08/06/19 1522)   vasopressin (PITRESSIN) infusion - *FOR SHOCK* 0.03 Units/min (08/06/19 0800)     LOS: 12 days   The patient is critically ill with multiple organ systems failure and requires high complexity decision making for assessment and support, frequent evaluation and titration of therapies, application of advanced monitoring technologies and extensive interpretation of multiple databases. Critical Care Time devoted to patient care services described in this note  Time spent: 40 minutes     Tonette Koehne, Geraldo Docker, MD [facility] Pager [phone]  If 7PM-7AM, please contact night-coverage [email or web address] Password [facility] 08/06/2019, 4:36 PM

## 2019-08-07 ENCOUNTER — Inpatient Hospital Stay (HOSPITAL_COMMUNITY): Payer: Medicare HMO

## 2019-08-07 DIAGNOSIS — I4891 Unspecified atrial fibrillation: Secondary | ICD-10-CM | POA: Diagnosis not present

## 2019-08-07 DIAGNOSIS — I959 Hypotension, unspecified: Secondary | ICD-10-CM | POA: Diagnosis not present

## 2019-08-07 DIAGNOSIS — J9600 Acute respiratory failure, unspecified whether with hypoxia or hypercapnia: Secondary | ICD-10-CM | POA: Diagnosis not present

## 2019-08-07 DIAGNOSIS — U071 COVID-19: Secondary | ICD-10-CM | POA: Diagnosis not present

## 2019-08-07 DIAGNOSIS — R509 Fever, unspecified: Secondary | ICD-10-CM

## 2019-08-07 DIAGNOSIS — Z992 Dependence on renal dialysis: Secondary | ICD-10-CM

## 2019-08-07 DIAGNOSIS — D649 Anemia, unspecified: Secondary | ICD-10-CM | POA: Diagnosis not present

## 2019-08-07 DIAGNOSIS — N179 Acute kidney failure, unspecified: Secondary | ICD-10-CM | POA: Diagnosis not present

## 2019-08-07 DIAGNOSIS — J9601 Acute respiratory failure with hypoxia: Secondary | ICD-10-CM | POA: Diagnosis not present

## 2019-08-07 DIAGNOSIS — J1289 Other viral pneumonia: Secondary | ICD-10-CM | POA: Diagnosis not present

## 2019-08-07 DIAGNOSIS — Z9911 Dependence on respirator [ventilator] status: Secondary | ICD-10-CM | POA: Diagnosis not present

## 2019-08-07 DIAGNOSIS — R918 Other nonspecific abnormal finding of lung field: Secondary | ICD-10-CM

## 2019-08-07 DIAGNOSIS — J984 Other disorders of lung: Secondary | ICD-10-CM | POA: Diagnosis not present

## 2019-08-07 DIAGNOSIS — Z9689 Presence of other specified functional implants: Secondary | ICD-10-CM | POA: Diagnosis not present

## 2019-08-07 DIAGNOSIS — A419 Sepsis, unspecified organism: Secondary | ICD-10-CM | POA: Diagnosis not present

## 2019-08-07 DIAGNOSIS — N400 Enlarged prostate without lower urinary tract symptoms: Secondary | ICD-10-CM | POA: Diagnosis not present

## 2019-08-07 DIAGNOSIS — E872 Acidosis: Secondary | ICD-10-CM | POA: Diagnosis not present

## 2019-08-07 DIAGNOSIS — J939 Pneumothorax, unspecified: Secondary | ICD-10-CM

## 2019-08-07 DIAGNOSIS — E8779 Other fluid overload: Secondary | ICD-10-CM

## 2019-08-07 DIAGNOSIS — K922 Gastrointestinal hemorrhage, unspecified: Secondary | ICD-10-CM | POA: Diagnosis not present

## 2019-08-07 DIAGNOSIS — A4901 Methicillin susceptible Staphylococcus aureus infection, unspecified site: Secondary | ICD-10-CM | POA: Diagnosis not present

## 2019-08-07 LAB — POCT I-STAT 7, (LYTES, BLD GAS, ICA,H+H)
Acid-Base Excess: 1 mmol/L (ref 0.0–2.0)
Acid-base deficit: 2 mmol/L (ref 0.0–2.0)
Acid-base deficit: 2 mmol/L (ref 0.0–2.0)
Bicarbonate: 27.7 mmol/L (ref 20.0–28.0)
Bicarbonate: 28.7 mmol/L — ABNORMAL HIGH (ref 20.0–28.0)
Bicarbonate: 27.9 mmol/L (ref 20.0–28.0)
Calcium, Ion: 1.02 mmol/L — ABNORMAL LOW (ref 1.15–1.40)
Calcium, Ion: 1 mmol/L — ABNORMAL LOW (ref 1.15–1.40)
Calcium, Ion: 0.98 mmol/L — ABNORMAL LOW (ref 1.15–1.40)
HCT: 26 % — ABNORMAL LOW (ref 39.0–52.0)
HCT: 25 % — ABNORMAL LOW (ref 39.0–52.0)
HCT: 22 % — ABNORMAL LOW (ref 39.0–52.0)
Hemoglobin: 8.8 g/dL — ABNORMAL LOW (ref 13.0–17.0)
Hemoglobin: 8.5 g/dL — ABNORMAL LOW (ref 13.0–17.0)
Hemoglobin: 7.5 g/dL — ABNORMAL LOW (ref 13.0–17.0)
O2 Saturation: 90 %
O2 Saturation: 90 %
O2 Saturation: 86 %
Patient temperature: 37.1
Patient temperature: 37.2
Patient temperature: 98.2
Potassium: 4.8 mmol/L (ref 3.5–5.1)
Potassium: 4.9 mmol/L (ref 3.5–5.1)
Potassium: 4.6 mmol/L (ref 3.5–5.1)
Sodium: 147 mmol/L — ABNORMAL HIGH (ref 135–145)
Sodium: 145 mmol/L (ref 135–145)
Sodium: 145 mmol/L (ref 135–145)
TCO2: 30 mmol/L (ref 22–32)
TCO2: 31 mmol/L (ref 22–32)
TCO2: 30 mmol/L (ref 22–32)
pCO2 arterial: 85.1 mmHg (ref 32.0–48.0)
pCO2 arterial: 90 mmHg (ref 32.0–48.0)
pCO2 arterial: 57.1 mmHg — ABNORMAL HIGH (ref 32.0–48.0)
pH, Arterial: 7.121 — CL (ref 7.350–7.450)
pH, Arterial: 7.112 — CL (ref 7.350–7.450)
pH, Arterial: 7.296 — ABNORMAL LOW (ref 7.350–7.450)
pO2, Arterial: 82 mmHg — ABNORMAL LOW (ref 83.0–108.0)
pO2, Arterial: 81 mmHg — ABNORMAL LOW (ref 83.0–108.0)
pO2, Arterial: 58 mmHg — ABNORMAL LOW (ref 83.0–108.0)

## 2019-08-07 LAB — CBC WITH DIFFERENTIAL/PLATELET
Abs Immature Granulocytes: 0.09 10*3/uL — ABNORMAL HIGH (ref 0.00–0.07)
Basophils Absolute: 0 10*3/uL (ref 0.0–0.1)
Basophils Relative: 0 %
Eosinophils Absolute: 0 10*3/uL (ref 0.0–0.5)
Eosinophils Relative: 0 %
HCT: 27.2 % — ABNORMAL LOW (ref 39.0–52.0)
Hemoglobin: 8.1 g/dL — ABNORMAL LOW (ref 13.0–17.0)
Immature Granulocytes: 1 %
Lymphocytes Relative: 4 %
Lymphs Abs: 0.5 10*3/uL — ABNORMAL LOW (ref 0.7–4.0)
MCH: 30.8 pg (ref 26.0–34.0)
MCHC: 29.8 g/dL — ABNORMAL LOW (ref 30.0–36.0)
MCV: 103.4 fL — ABNORMAL HIGH (ref 80.0–100.0)
Monocytes Absolute: 0.2 10*3/uL (ref 0.1–1.0)
Monocytes Relative: 2 %
Neutro Abs: 11.3 10*3/uL — ABNORMAL HIGH (ref 1.7–7.7)
Neutrophils Relative %: 93 %
Platelets: 204 10*3/uL (ref 150–400)
RBC: 2.63 MIL/uL — ABNORMAL LOW (ref 4.22–5.81)
RDW: 15.3 % (ref 11.5–15.5)
WBC: 12.1 10*3/uL — ABNORMAL HIGH (ref 4.0–10.5)
nRBC: 0.7 % — ABNORMAL HIGH (ref 0.0–0.2)

## 2019-08-07 LAB — C-REACTIVE PROTEIN: CRP: 16 mg/dL — ABNORMAL HIGH (ref <1.0)

## 2019-08-07 LAB — GLUCOSE, CAPILLARY
Glucose-Capillary: 314 mg/dL — ABNORMAL HIGH (ref 70–99)
Glucose-Capillary: 276 mg/dL — ABNORMAL HIGH (ref 70–99)
Glucose-Capillary: 160 mg/dL — ABNORMAL HIGH (ref 70–99)
Glucose-Capillary: 209 mg/dL — ABNORMAL HIGH (ref 70–99)
Glucose-Capillary: 202 mg/dL — ABNORMAL HIGH (ref 70–99)
Glucose-Capillary: 188 mg/dL — ABNORMAL HIGH (ref 70–99)

## 2019-08-07 LAB — COMPREHENSIVE METABOLIC PANEL
ALT: 6 U/L (ref 0–44)
AST: 28 U/L (ref 15–41)
Albumin: 2.5 g/dL — ABNORMAL LOW (ref 3.5–5.0)
Alkaline Phosphatase: 65 U/L (ref 38–126)
Anion gap: 16 — ABNORMAL HIGH (ref 5–15)
BUN: 185 mg/dL — ABNORMAL HIGH (ref 8–23)
CO2: 27 mmol/L (ref 22–32)
Calcium: 7.1 mg/dL — ABNORMAL LOW (ref 8.9–10.3)
Chloride: 103 mmol/L (ref 98–111)
Creatinine, Ser: 5.44 mg/dL — ABNORMAL HIGH (ref 0.61–1.24)
GFR calc Af Amer: 12 mL/min — ABNORMAL LOW (ref >60)
GFR calc non Af Amer: 10 mL/min — ABNORMAL LOW (ref >60)
Glucose, Bld: 362 mg/dL — ABNORMAL HIGH (ref 70–99)
Potassium: 5.1 mmol/L (ref 3.5–5.1)
Sodium: 146 mmol/L — ABNORMAL HIGH (ref 135–145)
Total Bilirubin: 0.3 mg/dL (ref 0.3–1.2)
Total Protein: 5 g/dL — ABNORMAL LOW (ref 6.5–8.1)

## 2019-08-07 LAB — PROTEIN ELECTROPHORESIS, SERUM
A/G Ratio: 1.2 (ref 0.7–1.7)
Albumin ELP: 2.5 g/dL — ABNORMAL LOW (ref 2.9–4.4)
Alpha-1-Globulin: 0.3 g/dL (ref 0.0–0.4)
Alpha-2-Globulin: 0.9 g/dL (ref 0.4–1.0)
Beta Globulin: 0.5 g/dL — ABNORMAL LOW (ref 0.7–1.3)
Gamma Globulin: 0.5 g/dL (ref 0.4–1.8)
Globulin, Total: 2.1 g/dL — ABNORMAL LOW (ref 2.2–3.9)
M-Spike, %: 0.2 g/dL — ABNORMAL HIGH
Total Protein ELP: 4.6 g/dL — ABNORMAL LOW (ref 6.0–8.5)

## 2019-08-07 LAB — RENAL FUNCTION PANEL
Albumin: 2.7 g/dL — ABNORMAL LOW (ref 3.5–5.0)
Anion gap: 14 (ref 5–15)
BUN: 162 mg/dL — ABNORMAL HIGH (ref 8–23)
CO2: 25 mmol/L (ref 22–32)
Calcium: 7.2 mg/dL — ABNORMAL LOW (ref 8.9–10.3)
Chloride: 106 mmol/L (ref 98–111)
Creatinine, Ser: 4.81 mg/dL — ABNORMAL HIGH (ref 0.61–1.24)
GFR calc Af Amer: 14 mL/min — ABNORMAL LOW (ref >60)
GFR calc non Af Amer: 12 mL/min — ABNORMAL LOW (ref >60)
Glucose, Bld: 220 mg/dL — ABNORMAL HIGH (ref 70–99)
Phosphorus: 8.1 mg/dL — ABNORMAL HIGH (ref 2.5–4.6)
Potassium: 5.2 mmol/L — ABNORMAL HIGH (ref 3.5–5.1)
Sodium: 145 mmol/L (ref 135–145)

## 2019-08-07 LAB — D-DIMER, QUANTITATIVE: D-Dimer, Quant: 4.74 ug/mL-FEU — ABNORMAL HIGH (ref 0.00–0.50)

## 2019-08-07 LAB — ANA W/REFLEX IF POSITIVE

## 2019-08-07 LAB — MPO/PR-3 (ANCA) ANTIBODIES
ANCA Proteinase 3: <3.5 U/mL (ref 0.0–3.5)
Myeloperoxidase Abs: <9 U/mL (ref 0.0–9.0)

## 2019-08-07 LAB — ANCA TITERS
Atypical P-ANCA titer: <1:20 {titer}
C-ANCA: <1:20 {titer}
P-ANCA: <1:20 {titer}

## 2019-08-07 LAB — MAGNESIUM: Magnesium: 4 mg/dL — ABNORMAL HIGH (ref 1.7–2.4)

## 2019-08-07 LAB — PHOSPHORUS: Phosphorus: 11.2 mg/dL — ABNORMAL HIGH (ref 2.5–4.6)

## 2019-08-07 LAB — C4 COMPLEMENT: Complement C4, Body Fluid: 16 mg/dL (ref 14–44)

## 2019-08-07 LAB — C3 COMPLEMENT: C3 Complement: 99 mg/dL (ref 82–167)

## 2019-08-07 MED ORDER — SODIUM BICARBONATE 8.4 % IV SOLN
50.0000 meq | Freq: Once | INTRAVENOUS | Status: AC
  Administered 2019-08-07: 17:00:00 50 meq via INTRAVENOUS

## 2019-08-07 MED ORDER — ALBUMIN HUMAN 25 % IV SOLN
50.0000 g | Freq: Once | INTRAVENOUS | Status: AC
  Administered 2019-08-07: 10:00:00 50 g via INTRAVENOUS
  Filled 2019-08-07: qty 100

## 2019-08-07 MED ORDER — PRISMASOL BGK 4/2.5 32-4-2.5 MEQ/L REPLACEMENT SOLN
Status: DC
  Administered 2019-08-07 – 2019-08-08 (×3): via INTRAVENOUS_CENTRAL

## 2019-08-07 MED ORDER — EPINEPHRINE 1 MG/10ML IJ SOSY
PREFILLED_SYRINGE | INTRAMUSCULAR | Status: AC
  Administered 2019-08-07: 18:00:00
  Filled 2019-08-07: qty 10

## 2019-08-07 MED ORDER — PRISMASOL BGK 4/2.5 32-4-2.5 MEQ/L REPLACEMENT SOLN
Status: DC
  Administered 2019-08-07 – 2019-08-19 (×15): via INTRAVENOUS_CENTRAL

## 2019-08-07 MED ORDER — SODIUM CHLORIDE 0.9 % FOR CRRT
INTRAVENOUS_CENTRAL | Status: DC | PRN
  Filled 2019-08-07: qty 1000

## 2019-08-07 MED ORDER — ROCURONIUM BROMIDE 50 MG/5ML IV SOLN
100.0000 mg | Freq: Once | INTRAVENOUS | Status: AC
  Administered 2019-08-07: 90 mg via INTRAVENOUS
  Filled 2019-08-07: qty 10

## 2019-08-07 MED ORDER — MIDAZOLAM HCL 2 MG/2ML IJ SOLN
4.0000 mg | Freq: Once | INTRAMUSCULAR | Status: AC
  Administered 2019-08-07: 4 mg via INTRAVENOUS

## 2019-08-07 MED ORDER — SODIUM BICARBONATE 8.4 % IV SOLN
50.0000 meq | Freq: Once | INTRAVENOUS | Status: AC
  Administered 2019-08-07: 50 meq via INTRAVENOUS

## 2019-08-07 MED ORDER — AMIODARONE IV BOLUS ONLY 150 MG/100ML
150.0000 mg | Freq: Once | INTRAVENOUS | Status: AC
  Administered 2019-08-07: 150 mg via INTRAVENOUS
  Filled 2019-08-07: qty 100

## 2019-08-07 MED ORDER — ROCURONIUM BROMIDE 10 MG/ML (PF) SYRINGE
PREFILLED_SYRINGE | INTRAVENOUS | Status: AC
  Administered 2019-08-07: 100 mg
  Filled 2019-08-07: qty 10

## 2019-08-07 MED ORDER — SODIUM CHLORIDE 0.9 % IV SOLN
2.0000 g | Freq: Two times a day (BID) | INTRAVENOUS | Status: DC
  Administered 2019-08-07 – 2019-08-11 (×8): 2 g via INTRAVENOUS
  Filled 2019-08-07 (×8): qty 2

## 2019-08-07 MED ORDER — MIDAZOLAM HCL 2 MG/2ML IJ SOLN
INTRAMUSCULAR | Status: AC
  Filled 2019-08-07: qty 4

## 2019-08-07 MED ORDER — CISATRACURIUM BESYLATE (PF) 10 MG/5ML IV SOLN
10.0000 mg | Freq: Once | INTRAVENOUS | Status: AC
  Administered 2019-08-07: 10 mg via INTRAVENOUS
  Filled 2019-08-07: qty 5

## 2019-08-07 MED ORDER — PRISMASOL BGK 4/2.5 32-4-2.5 MEQ/L IV SOLN
INTRAVENOUS | Status: DC
  Administered 2019-08-07 – 2019-08-11 (×23): via INTRAVENOUS_CENTRAL

## 2019-08-07 MED ORDER — HEPARIN SODIUM (PORCINE) 1000 UNIT/ML DIALYSIS: 1000.0000 [IU] | INTRAMUSCULAR | Status: DC | PRN

## 2019-08-07 MED ORDER — SODIUM BICARBONATE 8.4 % IV SOLN
INTRAVENOUS | Status: AC
  Administered 2019-08-07: 50 meq via INTRAVENOUS
  Filled 2019-08-07: qty 50

## 2019-08-07 MED ORDER — EPINEPHRINE PF 1 MG/ML IJ SOLN
0.5000 ug/min | INTRAVENOUS | Status: DC
  Filled 2019-08-07: qty 8

## 2019-08-07 MED ORDER — EPINEPHRINE 1 MG/10ML IJ SOSY: 1.0000 mg | PREFILLED_SYRINGE | Freq: Once | INTRAMUSCULAR | Status: AC

## 2019-08-07 NOTE — Progress Notes (Signed)
[facility] for Infectious Disease   Reason for visit: Follow up on Staph aureus bacteremia  Interval History: WBC down to 14.8, afebrile this am Day 7 total antibiotics Day 2 cefepime Day 2 anidulafungin  Physical Exam: Not examined Vitals:   08/07/19 1345 08/07/19 1400  BP:    Pulse: (!) 115 (!) 128  Resp: (!) 32 (!) 35  Temp:    SpO2: 100% 100%    Lab Results  Component Value Date   WBC 12.1 (H) 08/07/2019   HGB 8.5 (L) 08/07/2019   HCT 25.0 (L) 08/07/2019   MCV 103.4 (H) 08/07/2019   PLT 204 08/07/2019    Lab Results  Component Value Date   CREATININE 5.44 (H) 08/07/2019   BUN 185 (H) 08/07/2019   NA 145 08/07/2019   K 4.9 08/07/2019   CL 103 08/07/2019   CO2 27 08/07/2019    Lab Results  Component Value Date   ALT 6 08/07/2019   AST 28 08/07/2019   ALKPHOS 65 08/07/2019     Microbiology: Recent Results (from the past 240 hour(s))  Culture, blood (Routine X 2) w Reflex to ID Panel     Status: Abnormal   Collection Time: 08/01/19 12:15 PM   Specimen: BLOOD  Result Value Ref Range Status   Specimen Description   Final    BLOOD RIGHT ARM Performed at [facility], [place] [address], [place], [place] [address]    Special Requests   Final    BOTTLES DRAWN AEROBIC AND ANAEROBIC Blood Culture adequate volume Performed at [place] [address], [place], [place] [address]    Culture  Setup Time   Final    GRAM POSITIVE COCCI IN CLUSTERS IN BOTH AEROBIC AND ANAEROBIC BOTTLES CRITICAL RESULT CALLED TO, READ BACK BY AND VERIFIED WITH: C. SHADE PHARMD, AT 2197 08/02/19 BY Rush Landmark Performed at [facility], [place] [address], [place], [place] [address]    Culture STAPHYLOCOCCUS AUREUS (A)  Final   Report Status 08/04/2019 FINAL  Final   Organism ID, Bacteria STAPHYLOCOCCUS AUREUS  Final      Susceptibility   Staphylococcus aureus - MIC*    CIPROFLOXACIN <=0.5 SENSITIVE Sensitive     ERYTHROMYCIN  <=0.25 SENSITIVE Sensitive     GENTAMICIN <=0.5 SENSITIVE Sensitive     OXACILLIN 0.5 SENSITIVE Sensitive     TETRACYCLINE <=1 SENSITIVE Sensitive     VANCOMYCIN 1 SENSITIVE Sensitive     TRIMETH/SULFA <=10 SENSITIVE Sensitive     CLINDAMYCIN <=0.25 SENSITIVE Sensitive     RIFAMPIN <=0.5 SENSITIVE Sensitive     Inducible Clindamycin NEGATIVE Sensitive     * STAPHYLOCOCCUS AUREUS  Blood Culture ID Panel (Reflexed)     Status: Abnormal   Collection Time: 08/01/19 12:15 PM  Result Value Ref Range Status   Enterococcus species NOT DETECTED NOT DETECTED Final   Listeria monocytogenes NOT DETECTED NOT DETECTED Final   Staphylococcus species DETECTED (A) NOT DETECTED Final    Comment: CRITICAL RESULT CALLED TO, READ BACK BY AND VERIFIED WITH: C. SHADE PHARMD, AT 5498 08/02/19 BY D. VANHOOK    Staphylococcus aureus (BCID) DETECTED (A) NOT DETECTED Final    Comment: Methicillin (oxacillin) susceptible Staphylococcus aureus (MSSA). Preferred therapy is anti staphylococcal beta lactam antibiotic (Cefazolin or Nafcillin), unless clinically contraindicated. CRITICAL RESULT CALLED TO, READ BACK BY AND VERIFIED WITH: C. SHADE PHARMD, AT 2641 08/02/19 BY D. VANHOOK    Methicillin resistance NOT DETECTED NOT  DETECTED Final   Streptococcus species NOT DETECTED NOT DETECTED Final   Streptococcus agalactiae NOT DETECTED NOT DETECTED Final   Streptococcus pneumoniae NOT DETECTED NOT DETECTED Final   Streptococcus pyogenes NOT DETECTED NOT DETECTED Final   Acinetobacter baumannii NOT DETECTED NOT DETECTED Final   Enterobacteriaceae species NOT DETECTED NOT DETECTED Final   Enterobacter cloacae complex NOT DETECTED NOT DETECTED Final   Escherichia coli NOT DETECTED NOT DETECTED Final   Klebsiella oxytoca NOT DETECTED NOT DETECTED Final   Klebsiella pneumoniae NOT DETECTED NOT DETECTED Final   Proteus species NOT DETECTED NOT DETECTED Final   Serratia marcescens NOT DETECTED NOT DETECTED Final    Haemophilus influenzae NOT DETECTED NOT DETECTED Final   Neisseria meningitidis NOT DETECTED NOT DETECTED Final   Pseudomonas aeruginosa NOT DETECTED NOT DETECTED Final   Candida albicans NOT DETECTED NOT DETECTED Final   Candida glabrata NOT DETECTED NOT DETECTED Final   Candida krusei NOT DETECTED NOT DETECTED Final   Candida parapsilosis NOT DETECTED NOT DETECTED Final   Candida tropicalis NOT DETECTED NOT DETECTED Final    Comment: Performed at [facility], [place] [address], [place], [place] [address]  Culture, blood (Routine X 2) w Reflex to ID Panel     Status: Abnormal   Collection Time: 08/01/19 12:21 PM   Specimen: BLOOD  Result Value Ref Range Status   Specimen Description   Final    BLOOD RIGHT HAND Performed at [facility] [address] [place], [place] [address]    Special Requests   Final    BOTTLES DRAWN AEROBIC AND ANAEROBIC Blood Culture adequate volume Performed at [facility] [address], [place], [place] [address]    Culture  Setup Time   Final    GRAM POSITIVE COCCI IN CLUSTERS IN BOTH AEROBIC AND ANAEROBIC BOTTLES CRITICAL RESULT CALLED TO, READ BACK BY AND VERIFIED WITH: C. SHADE PHARMD, AT 8182 08/02/19 BY D. VANHOOK    Culture (A)  Final    STAPHYLOCOCCUS AUREUS SUSCEPTIBILITIES PERFORMED ON PREVIOUS CULTURE WITHIN THE LAST 5 DAYS. Performed at [facility], [place] [address] [place], [place] [address]    Report Status 08/04/2019 FINAL  Final  Culture, respiratory     Status: None   Collection Time: 08/01/19  3:30 PM   Specimen: Tracheal Aspirate  Result Value Ref Range Status   Specimen Description   Final    TRACHEAL ASPIRATE Performed at [facility] [address], [place], [place] [address]    Special Requests   Final    NONE Performed at [facility], [place] [address] [place], [place] [address]    Gram Stain   Final    RARE WBC PRESENT,  PREDOMINANTLY PMN ABUNDANT GRAM POSITIVE COCCI IN CLUSTERS Performed at [facility], [place] [address], [place], [place] [address]    Culture   Final    MODERATE STAPHYLOCOCCUS AUREUS RARE PSEUDOMONAS AERUGINOSA    Report Status 08/06/2019 FINAL  Final   Organism ID, Bacteria STAPHYLOCOCCUS AUREUS  Final   Organism ID, Bacteria PSEUDOMONAS AERUGINOSA  Final      Susceptibility   Pseudomonas aeruginosa - MIC*    CEFTAZIDIME 4 SENSITIVE Sensitive     CIPROFLOXACIN 0.5 SENSITIVE Sensitive     GENTAMICIN <=1 SENSITIVE Sensitive     IMIPENEM 1 SENSITIVE Sensitive     PIP/TAZO 16 SENSITIVE Sensitive     CEFEPIME 4 SENSITIVE Sensitive     *  RARE PSEUDOMONAS AERUGINOSA   Staphylococcus aureus - MIC*    CIPROFLOXACIN <=0.5 SENSITIVE Sensitive     ERYTHROMYCIN <=0.25 SENSITIVE Sensitive     GENTAMICIN <=0.5 SENSITIVE Sensitive     OXACILLIN 0.5 SENSITIVE Sensitive     TETRACYCLINE <=1 SENSITIVE Sensitive     VANCOMYCIN <=0.5 SENSITIVE Sensitive     TRIMETH/SULFA <=10 SENSITIVE Sensitive     CLINDAMYCIN <=0.25 SENSITIVE Sensitive     RIFAMPIN <=0.5 SENSITIVE Sensitive     Inducible Clindamycin NEGATIVE Sensitive     * MODERATE STAPHYLOCOCCUS AUREUS  Culture, Urine     Status: Abnormal   Collection Time: 08/01/19  5:45 PM   Specimen: Urine, Catheterized  Result Value Ref Range Status   Specimen Description   Final    URINE, CATHETERIZED Performed at [facility] [facility] [address], [place], [place] [address]    Special Requests   Final    NONE Performed at [facility], [place] [address], [place], [place] [address]    Culture (A)  Final    <10,000 COLONIES/mL INSIGNIFICANT GROWTH Performed at [facility] [address] [place], [place] [address]    Report Status 08/03/2019 FINAL  Final  MRSA PCR Screening     Status: None   Collection Time: 08/02/19 10:02 AM   Specimen: Nasal Mucosa; Nasopharyngeal  Result Value Ref Range Status    MRSA by PCR NEGATIVE NEGATIVE Final    Comment:        The GeneXpert MRSA Assay (FDA approved for NASAL specimens only), is one component of a comprehensive MRSA colonization surveillance program. It is not intended to diagnose MRSA infection nor to guide or monitor treatment for MRSA infections. Performed at [facility], [place] [address], [place], [place] [address]   Culture, blood (routine x 2)     Status: None (Preliminary result)   Collection Time: 08/05/19  5:00 AM   Specimen: BLOOD RIGHT HAND  Result Value Ref Range Status   Specimen Description   Final    BLOOD RIGHT HAND Performed at [facility] [address] [place], [place] [address]    Special Requests   Final    BOTTLES DRAWN AEROBIC ONLY Blood Culture adequate volume Performed at [facility] [address] [place], [place] [address]    Culture   Final    NO GROWTH 2 DAYS Performed at [facility] [address] [place], [place] [address]    Report Status PENDING  Incomplete  Culture, blood (routine x 2)     Status: None (Preliminary result)   Collection Time: 08/05/19  5:05 AM   Specimen: BLOOD LEFT HAND  Result Value Ref Range Status   Specimen Description   Final    BLOOD LEFT HAND Performed at [facility] [address], [place], [place] [address]    Special Requests   Final    BOTTLES DRAWN AEROBIC ONLY Blood Culture adequate volume Performed at [facility] [address] [place], [place] [address]    Culture   Final    NO GROWTH 2 DAYS Performed at [facility] [address] [place], [place] [address]    Report Status PENDING  Incomplete    Impression/Plan:  1. MSSA bacteremia - on cefepime now.  With pulmonary findings, possible septic emboli, no TTE. I would plan for 6 weeks of IV treatment.  Can narrow back to cefazolin  after 7 days of cefepime.  Treat through November 23rd.     2. Pseudomonas in trach aspirate - covered with #1 above.  Rare growth.  would treat for 7 days.    3.  Fever - partly from #1 and pneumothorax. Has improved.  Anidulafungin added empirically.  I think this can be stopped.  No signs of fungal infection.   Dr. Johnnye Sima starting service tomorrow, he will be available as needed but otherwise will sign off, call with any issues or questions.  Thanks

## 2019-08-07 NOTE — Progress Notes (Signed)
PROGRESS NOTE    [name]  HAL:[id number] DOB: 10-15-1951 DOA: 07/25/2019 PCP: Kathyrn Drown, MD   Brief Narrative:  68 year old BM PMHx GERD, BPH   Presented to [facility] ER with shortness of breath and hypoxia.  Patient was diagnosed with COVID 19 on July 13 9019.  Patient's wife also has COVID-19 but is asymptomatic.  Over the last 1 to 2 days, the patient's shortness of breath is increased.  Patient has been suffering with anorexia.  Patient's wife was actually having a conversation via teleconference with the patient's PCP.  PCP is also otherwise physician.  He had actually called to check on how the wife was doing.  The wife had mentioned to the physician that the patient has been laying in bed.  When the physician listened to how the patient was speaking over the phone, it was noted the patient was tachypneic.  EMS was activated.  When EMS arrived, the patient's O2 saturations were apparently in the 40 percentile range.  Patient was placed on nonrebreather.  He was transferred to the ER.  In the ER, his oxygen saturations on 100% nonrebreather were only in the 80s.  Patient was confused.  Patient was emergently intubated.  Patient given IV steroids.  Initial chest x-ray demonstrates diffuse multifocal infiltrates.  CT scan of the chest was negative for PE.  ABG prior to leaving for [place] demonstrates a pH 7.41 PCO2 44 PO2 of 87 on FiO2 100%.  AA gradient calculates out to 571.  I personally called the patient's wife on the evening of July 25, 2019.  During that conversation we had discussed using IV steroids, IV remdesivir.  We also discussed off label use of Actemra and possibly confluence of plasma due to the patient's severe COVID-19 pneumonia.  She verbally agreed that the medical team should try anything possible to make the patient better.  Confirm with the wife the patient is a full code.    ED Course: emergently intubated. Central line placed. Started on  IV Decadron.    Subjective: 10/15 intubated/sedated, T-max last 24 hours 37.9 C   Assessment & Plan:   Principal Problem:   Pneumonia due to COVID-19 virus Active Problems:   BPH (benign prostatic hyperplasia)   Acute respiratory failure due to COVID-19 (HCC)   AKI (acute kidney injury) (HCC)   Fluid overload   Pneumothorax on right   Acute renal failure (ARF) (HCC)   GI bleed   Atrial fibrillation with RVR (HCC)  COVID pneumonia/acute respiratory stress with hypoxia/bacterial superinfection - Continue Decadron - Continue Remdesivir per pharmacy protocol - 10/2 Actemra x1 dose - 10/3 counseled patient wife on CCP over phone and she has agreed to allow [place] to administer CCP.  Paperwork in chart  -10/3 transfuse 1 unit CCP - Continue prone patient> 16 hours/day if patient tolerates.  If not prone patient for 2 to 3 hours every shift. -10/3 patient enrolled in [place]  Lab 08/03/19 1035 08/04/19 0535 08/05/19 0550 08/06/19 0610 08/07/19 0500  CRP 1.1* 0.8 <0.8 4.6* 16.0*   Recent Labs  Lab 08/03/19 1035 08/04/19 0535 08/05/19 0550 08/06/19 0610 08/07/19 0500  DDIMER 3.91* 5.44* 6.58* 2.98* 4.74*  - Titrate patient's vent settings to maintain SPO2 88 to 93%, if possible.  Will use ARDS lung protective protocol (6 to 8 cc/kg/min). - Attempt to maintain plateau pressure<30 - Attempt to maintain driving pressure 15 - 10/5 PF ratio= 170 while prone - 10/6  PCXR pending - 10/6 transfuse 1 unit CCP (this is patient's second unit) -10/10 MSSA bacteremia.  Antibiotics initiated per ID recommendations -10/13 PCXR large RIGHT sided pneumothorax; s/p chest tube placement with reinflation of lung.  High-volume leak (questionable minimal leak at insertion site improved with Vaseline gauze) -10/14 continue broad-spectrum antibiotic and antifungal added -10/14 stress dose steroids added -10/15 s/p bronchoscopy with placement of endobronchial balloon right  intermedius bronchi.  Patient now with minimal air leak  A. fib RVR -Amiodarone drip -If patient does not stabilize will cardiovert  GI bleed -H/H was stable x72 hours Recent Labs  Lab 08/06/19 2343 08/07/19 0245 08/07/19 0500 08/07/19 0552 08/07/19 1805  HGB 8.8* 8.8* 8.1* 8.5* 7.5*  -Hemoglobin trending down over last 24 hours. -Occult blood pending -10/14 Heparin SQ (hold).   -10/15 GI bleed stabilized with heparin on hold.  If remained stable overnight will restart patient on minimal therapeutic dose.  Pneumoperitoneum? -Per Dr. [place] Bells radiology contacted him believes has pneumoperitoneum -Negative pneumoperitoneum on lateral decubitus x-ray.  Fluid overload - Strict in and out +4.9 L -Daily weight Filed Weights   08/03/19 0500 08/06/19 0500 08/07/19 0320  Weight: 90 kg 87.6 kg 90.1 kg  - CVP daily -10/15 CVP= 13  Acute renal failure - Avoid nephrotoxic medication Recent Labs  Lab 08/03/19 1035 08/04/19 0535 08/05/19 0550 08/06/19 0610 08/07/19 0500  CREATININE 1.61* 2.09* 2.78* 4.25* 5.44*  -10/14 renal ultrasound pending -SPEP, UPEP, complements, c-ANCA, urine electrolytes, urine osmolality, serum osmolality, renal ultrasound pending - Spoke with Dr. Justin Mend nephrology will see patient first thing in the a.m. -10/15 Per nephrology begin CRRT  Metabolic acidosis -Sodium bicarb drip 122ml/hr -10/15 DC sodium bicarb drip start CRRT  BPH -Not on home meds.  But be cognizant of urinary retention    Goals of care - CCP use; counseled Mrs. Vanwieren that CCP is not improving therapy for COVID-19 though available data shows it may have benefit with high titer plasma is given.  Is unclear if it is effective other patients and with use of low titer plasma.  Risk and benefits of its use were explained to Mrs. Torian and she agreed to treatment..  All appropriate IRB consent paperwork has been signed and is in patient's chart.   DVT prophylaxis: Lovenox (ICU  dose) Code Status: Full Family Communication: 10/15 Dr. Kathalene Frames spoke with family.  Also spoke with wife [name] and obtain permission to make patient DNR, after going over in detail patient's multisystem organ failure. Disposition Plan: TBD   Consultants:  PCCM   Procedures/Significant Events:  10/2 PCXR;-Endotracheal tube remains with tip above the thoracic inlet. Recommend repositioning and advancement. Esophagogastric tube with tip and side port below the diaphragm. - Unchanged diffuse, extensive bilateral heterogeneous opacity, consistent with multifocal infection, edema, and/or ARDS. 10/3 PCXR; slightly improved extensive bilateral heterogeneous opacifications (my read) 10/3 transfuse 1 unit CCP 10/6 transfuse 1 unit CCP (this is patient's second unit) 10/10 PCXR - Personally reviewed: Poor inspiration - mild improvement in aeration 10/11 PCXR - Personally reviewed: Left lower lobe appears to be improving; RLL stable 10/13 PCXR - Personally reviewed: Large R sided pneumothorax - re-expanded after chest tube placed overnight 10/14 PCXR;-small residual right apical pneumothorax, significantly decreased. -Stable extensive patchy opacities throughout both lungs 10/14 DG abdomen decubitus; negative evidence of pneumoperitoneum -Moderate gas in nondependent:; Nonobstructive bowel gas pattern 10/15 s/p bronchoscopy with placement of endobronchial balloon right intermedius bronchi 10/15 renal ultrasound; normal ultrasound 10/15 Per nephrology begin CRRT  I have personally reviewed and interpreted all radiology studies and my findings are as above.  VENTILATOR SETTINGS: Vent mode; PCV Vt Set;  Set rate; 24 FiO2; 100% I time;  PEEP; 10     Cultures 9/29 Novel coronavirus positive 10/2 blood LEFT antecubital NGTD 10/2 blood hand NGTD 10/3 MRSA by PCR negative     Antimicrobials: Anti-infectives (From admission, onward)   Start     Stop   08/07/19 1000   anidulafungin (ERAXIS) 100 mg in sodium chloride 0.9 % 100 mL IVPB         08/07/19 0800  ceFEPIme (MAXIPIME) 2 g in sodium chloride 0.9 % 100 mL IVPB         08/06/19 1000  anidulafungin (ERAXIS) 200 mg in sodium chloride 0.9 % 200 mL IVPB    Note to Pharmacy: please   08/06/19 1514   08/06/19 0800  ceFEPIme (MAXIPIME) 2 g in sodium chloride 0.9 % 100 mL IVPB  Status:  Discontinued     08/06/19 1036   08/02/19 2200  ceFAZolin (ANCEF) IVPB 2g/100 mL premix  Status:  Discontinued     08/06/19 0733   08/02/19 1400  vancomycin (VANCOCIN) 1,250 mg in sodium chloride 0.9 % 250 mL IVPB  Status:  Discontinued     08/02/19 1332   08/01/19 1330  vancomycin (VANCOCIN) 2,000 mg in sodium chloride 0.9 % 500 mL IVPB     08/01/19 1636   08/01/19 1300  ceFEPIme (MAXIPIME) 2 g in sodium chloride 0.9 % 100 mL IVPB  Status:  Discontinued     08/02/19 1332   07/26/19 1600  remdesivir 100 mg in sodium chloride 0.9 % 250 mL IVPB     07/29/19 1823   07/26/19 0215  remdesivir 200 mg in sodium chloride 0.9 % 250 mL IVPB     07/26/19 0520       Devices    LINES / TUBES:  #7.5 ETT 10/2>> RIGHT IJ CVL>>> RIGHT chest tube 10/13>>> LEFT Ig HD cath 10/14>>   Continuous Infusions:   prismasol BGK 4/2.5 500 mL/hr at 08/07/19 1245    prismasol BGK 4/2.5 500 mL/hr at 08/07/19 1245   sodium chloride Stopped (08/01/19 0957)   sodium chloride 10 mL/hr at 08/07/19 1800   amiodarone Stopped (08/07/19 1715)   anidulafungin Stopped (08/07/19 1014)   ceFEPime (MAXIPIME) IV     epinephrine     feeding supplement (PIVOT 1.5 CAL) 1,000 mL (08/07/19 1758)   fentaNYL infusion INTRAVENOUS Stopped (08/07/19 1702)   midazolam Stopped (08/05/19 0737)   norepinephrine (LEVOPHED) Adult infusion 60 mcg/min (08/07/19 1800)   phenylephrine (NEO-SYNEPHRINE) Adult infusion Stopped (08/06/19 1643)   prismasol BGK 4/2.5 2,000 mL/hr at 08/07/19 1805   vasopressin (PITRESSIN) infusion - *FOR SHOCK* Stopped  (08/07/19 1742)     Objective: Vitals:   08/07/19 1800 08/07/19 1815 08/07/19 1830 08/07/19 1845  BP:      Pulse: (!) 118     Resp: (!) 35 (!) 28 (!) 35 (!) 35  Temp:    (!) 93.7 F (34.3 C)  TempSrc:    Esophageal  SpO2: 98%     Weight:      Height:        Intake/Output Summary (Last 24 hours) at 08/07/2019 1857 Last data filed at 08/07/2019 1800 Gross per 24 hour  Intake 8798.25 ml  Output 1981 ml  Net 6817.25 ml   Filed Weights   08/03/19 0500 08/06/19 0500 08/07/19 0320  Weight: 90 kg  87.6 kg 90.1 kg   Physical Exam:  General: Intubated/sedated positive acute respiratory distress Eyes: negative scleral hemorrhage, negative anisocoria, negative icterus ENT: Negative Runny nose, negative gingival bleeding, #7.5 ETT tube tube in place + endobronchial balloon Neck:  Negative scars, masses, torticollis, lymphadenopathy, JVD Lungs: TACHYPNEIC right rhonchi/crackles decreased after placement of endobronchial tube, left lung clear to auscultation without wheezes or crackles Cardiovascular: Irregular irregular rhythm and rate without murmur gallop or rub normal S1 and S2 Abdomen: negative abdominal pain, nondistended, positive soft, bowel sounds, no rebound, no ascites, no appreciable mass Extremities: No significant cyanosis, clubbing, or edema bilateral lower extremities Skin: Negative rashes, lesions, ulcers Psychiatric: Intubated and sedated Central nervous system: Intubated and sedated            Data Reviewed: Care during the described time interval was provided by me .  I have reviewed this patient's available data, including medical history, events of note, physical examination, and all test results as part of my evaluation.   CBC: Recent Labs  Lab 08/04/19 0535  08/05/19 0550  08/06/19 0610  08/06/19 1750 08/06/19 2343 08/07/19 0245 08/07/19 0500 08/07/19 0552 08/07/19 1805  WBC 26.3*  --  31.9*  --  14.8*  --  15.9*  --   --  12.1*  --   --    NEUTROABS  --   --   --   --   --   --   --   --   --  11.3*  --   --   HGB 11.3*   < > 11.9*   < > 9.8*   < > 9.5* 8.8* 8.8* 8.1* 8.5* 7.5*  HCT 36.5*   < > 38.7*   < > 32.9*   < > 31.3* 26.0* 26.0* 27.2* 25.0* 22.0*  MCV 99.5  --  99.7  --  103.1*  --  103.3*  --   --  103.4*  --   --   PLT 373  --  395  --  259  --  245  --   --  204  --   --    < > = values in this interval not displayed.   Basic Metabolic Panel: Recent Labs  Lab 08/01/19 0500  08/02/19 0457  08/03/19 1035  08/04/19 0535  08/05/19 0550  08/06/19 0610  08/06/19 2343 08/07/19 0245 08/07/19 0500 08/07/19 0552 08/07/19 1805  NA 146*   < > 145   < > 144   < > 148*   < > 149*   < > 149*   < > 146* 147* 146* 145 145  K 4.1   < > 4.3   < > 4.8   < > 5.0   < > 4.3   < > 4.2   < > 4.8 4.8 5.1 4.9 4.6  CL 93*  --  99  --  98  --  100  --  103  --  108  --   --   --  103  --   --   CO2 35*  --  35*  --  35*  --  34*  --  29  --  28  --   --   --  27  --   --   GLUCOSE 144*  --  157*  --  134*  --  130*  --  186*  --  229*  --   --   --  362*  --   --  BUN 77*  --  74*  --  99*  --  104*  --  129*  --  164*  --   --   --  185*  --   --   CREATININE 1.96*  --  1.54*  --  1.61*  --  2.09*  --  2.78*  --  4.25*  --   --   --  5.44*  --   --   CALCIUM 9.2  --  9.4  --  9.4  --  9.2  --  8.8*  --  7.6*  --   --   --  7.1*  --   --   MG 2.6*  --  3.1*  --   --   --   --   --   --   --   --   --   --   --  4.0*  --   --   PHOS 5.7*  --  3.8  --   --   --   --   --   --   --   --   --   --   --  11.2*  --   --    < > = values in this interval not displayed.   GFR: Estimated Creatinine Clearance: 15.1 mL/min (A) (by C-G formula based on SCr of 5.44 mg/dL (H)). Liver Function Tests: Recent Labs  Lab 08/03/19 1035 08/04/19 0535 08/05/19 0550 08/06/19 0610 08/07/19 0500  AST 21 27 31 22 28   ALT 28 22 19 9 6   ALKPHOS 94 121 104 45 65  BILITOT 0.7 0.5 0.6 1.0 0.3  PROT 6.5 6.8 6.8 5.3* 5.0*  ALBUMIN 3.5 3.5 3.5 3.2* 2.5*    No results for input(s): LIPASE, AMYLASE in the last 168 hours. No results for input(s): AMMONIA in the last 168 hours. Coagulation Profile: No results for input(s): INR, PROTIME in the last 168 hours. Cardiac Enzymes: No results for input(s): CKTOTAL, CKMB, CKMBINDEX, TROPONINI in the last 168 hours. BNP (last 3 results) No results for input(s): PROBNP in the last 8760 hours. HbA1C: No results for input(s): HGBA1C in the last 72 hours. CBG: Recent Labs  Lab 08/06/19 2342 08/07/19 0455 08/07/19 0749 08/07/19 1248 08/07/19 1604  GLUCAP 297* 314* 276* 160* 209*   Lipid Profile: No results for input(s): CHOL, HDL, LDLCALC, TRIG, CHOLHDL, LDLDIRECT in the last 72 hours. Thyroid Function Tests: No results for input(s): TSH, T4TOTAL, FREET4, T3FREE, THYROIDAB in the last 72 hours. Anemia Panel: Recent Labs    08/05/19 0550 08/06/19 0610  FERRITIN 1,072* 681*   Urine analysis:    Component Value Date/Time   COLORURINE YELLOW 08/01/2019 1745   APPEARANCEUR HAZY (A) 08/01/2019 1745   LABSPEC 1.023 08/01/2019 1745   PHURINE 5.0 08/01/2019 1745   GLUCOSEU NEGATIVE 08/01/2019 1745   HGBUR NEGATIVE 08/01/2019 1745   BILIRUBINUR NEGATIVE 08/01/2019 1745   KETONESUR NEGATIVE 08/01/2019 1745   PROTEINUR NEGATIVE 08/01/2019 1745   NITRITE NEGATIVE 08/01/2019 1745   LEUKOCYTESUR NEGATIVE 08/01/2019 1745   Sepsis Labs: @LABRCNTIP (procalcitonin:4,lacticidven:4)  ) Recent Results (from the past 240 hour(s))  Culture, blood (Routine X 2) w Reflex to ID Panel     Status: Abnormal   Collection Time: 08/01/19 12:15 PM   Specimen: BLOOD  Result Value Ref Range Status   Specimen Description   Final    BLOOD RIGHT ARM Performed at [facility], [place] [address], [place], [place] [address]  Special Requests   Final    BOTTLES DRAWN AEROBIC AND ANAEROBIC Blood Culture adequate volume Performed at [place]  [address] [place],  [place] [address]    Culture  Setup Time   Final    GRAM POSITIVE COCCI IN CLUSTERS IN BOTH AEROBIC AND ANAEROBIC BOTTLES CRITICAL RESULT CALLED TO, READ BACK BY AND VERIFIED WITH: C. SHADE PHARMD, AT 3086 08/02/19 BY Rush [place]mark Performed at [facility], [place] [address], [place], [place] [address]    Culture STAPHYLOCOCCUS AUREUS (A)  Final   Report Status 08/04/2019 FINAL  Final   Organism ID, Bacteria STAPHYLOCOCCUS AUREUS  Final      Susceptibility   Staphylococcus aureus - MIC*    CIPROFLOXACIN <=0.5 SENSITIVE Sensitive     ERYTHROMYCIN <=0.25 SENSITIVE Sensitive     GENTAMICIN <=0.5 SENSITIVE Sensitive     OXACILLIN 0.5 SENSITIVE Sensitive     TETRACYCLINE <=1 SENSITIVE Sensitive     VANCOMYCIN 1 SENSITIVE Sensitive     TRIMETH/SULFA <=10 SENSITIVE Sensitive     CLINDAMYCIN <=0.25 SENSITIVE Sensitive     RIFAMPIN <=0.5 SENSITIVE Sensitive     Inducible Clindamycin NEGATIVE Sensitive     * STAPHYLOCOCCUS AUREUS  Blood Culture ID Panel (Reflexed)     Status: Abnormal   Collection Time: 08/01/19 12:15 PM  Result Value Ref Range Status   Enterococcus species NOT DETECTED NOT DETECTED Final   Listeria monocytogenes NOT DETECTED NOT DETECTED Final   Staphylococcus species DETECTED (A) NOT DETECTED Final    Comment: CRITICAL RESULT CALLED TO, READ BACK BY AND VERIFIED WITH: C. SHADE PHARMD, AT 9629 08/02/19 BY D. VANHOOK    Staphylococcus aureus (BCID) DETECTED (A) NOT DETECTED Final    Comment: Methicillin (oxacillin) susceptible Staphylococcus aureus (MSSA). Preferred therapy is anti staphylococcal beta lactam antibiotic (Cefazolin or Nafcillin), unless clinically contraindicated. CRITICAL RESULT CALLED TO, READ BACK BY AND VERIFIED WITH: C. SHADE PHARMD, AT 5284 08/02/19 BY D. VANHOOK    Methicillin resistance NOT DETECTED NOT DETECTED Final   Streptococcus species NOT DETECTED NOT DETECTED Final   Streptococcus agalactiae NOT DETECTED NOT DETECTED Final   Streptococcus  pneumoniae NOT DETECTED NOT DETECTED Final   Streptococcus pyogenes NOT DETECTED NOT DETECTED Final   Acinetobacter baumannii NOT DETECTED NOT DETECTED Final   Enterobacteriaceae species NOT DETECTED NOT DETECTED Final   Enterobacter cloacae complex NOT DETECTED NOT DETECTED Final   Escherichia coli NOT DETECTED NOT DETECTED Final   Klebsiella oxytoca NOT DETECTED NOT DETECTED Final   Klebsiella pneumoniae NOT DETECTED NOT DETECTED Final   Proteus species NOT DETECTED NOT DETECTED Final   Serratia marcescens NOT DETECTED NOT DETECTED Final   Haemophilus influenzae NOT DETECTED NOT DETECTED Final   Neisseria meningitidis NOT DETECTED NOT DETECTED Final   Pseudomonas aeruginosa NOT DETECTED NOT DETECTED Final   Candida albicans NOT DETECTED NOT DETECTED Final   Candida glabrata NOT DETECTED NOT DETECTED Final   Candida krusei NOT DETECTED NOT DETECTED Final   Candida parapsilosis NOT DETECTED NOT DETECTED Final   Candida tropicalis NOT DETECTED NOT DETECTED Final    Comment: Performed at [facility], [place] [address] [place], [place] [address]  Culture, blood (Routine X 2) w Reflex to ID Panel     Status: Abnormal   Collection Time: 08/01/19 12:21 PM   Specimen: BLOOD  Result Value Ref Range Status   Specimen Description   Final    BLOOD RIGHT HAND Performed at [facility]  [facility], [place] [address] [place], [place] [address]    Special Requests   Final    BOTTLES DRAWN AEROBIC AND ANAEROBIC Blood Culture adequate volume Performed at [place] [address], [place], [place] [address]    Culture  Setup Time   Final    GRAM POSITIVE COCCI IN CLUSTERS IN BOTH AEROBIC AND ANAEROBIC BOTTLES CRITICAL RESULT CALLED TO, READ BACK BY AND VERIFIED WITH: C. SHADE PHARMD, AT 7106 08/02/19 BY D. VANHOOK    Culture (A)  Final    STAPHYLOCOCCUS AUREUS SUSCEPTIBILITIES PERFORMED ON PREVIOUS CULTURE WITHIN THE LAST 5 DAYS. Performed at [place], [place] [address], [place], [place] [address]    Report Status 08/04/2019 FINAL  Final  Culture, respiratory     Status: None   Collection Time: 08/01/19  3:30 PM   Specimen: Tracheal Aspirate  Result Value Ref Range Status   Specimen Description   Final    TRACHEAL ASPIRATE Performed at [place] [address] [place], [place] [address]    Special Requests   Final    NONE Performed at [facility], [place] [address], [place], [place] [address]    Gram Stain   Final    RARE WBC PRESENT, PREDOMINANTLY PMN ABUNDANT GRAM POSITIVE COCCI IN CLUSTERS Performed at [facility], [place] [address], [place], [place] [address]    Culture   Final    MODERATE STAPHYLOCOCCUS AUREUS RARE PSEUDOMONAS AERUGINOSA    Report Status 08/06/2019 FINAL  Final   Organism ID, Bacteria STAPHYLOCOCCUS AUREUS  Final   Organism ID, Bacteria PSEUDOMONAS AERUGINOSA  Final      Susceptibility   Pseudomonas aeruginosa - MIC*    CEFTAZIDIME 4 SENSITIVE Sensitive     CIPROFLOXACIN 0.5 SENSITIVE Sensitive     GENTAMICIN <=1 SENSITIVE Sensitive     IMIPENEM 1 SENSITIVE Sensitive     PIP/TAZO 16 SENSITIVE Sensitive     CEFEPIME 4 SENSITIVE Sensitive     * RARE PSEUDOMONAS AERUGINOSA   Staphylococcus aureus - MIC*    CIPROFLOXACIN <=0.5 SENSITIVE Sensitive     ERYTHROMYCIN <=0.25 SENSITIVE Sensitive     GENTAMICIN <=0.5 SENSITIVE Sensitive     OXACILLIN 0.5 SENSITIVE Sensitive     TETRACYCLINE <=1 SENSITIVE Sensitive     VANCOMYCIN <=0.5 SENSITIVE Sensitive     TRIMETH/SULFA <=10 SENSITIVE Sensitive     CLINDAMYCIN <=0.25 SENSITIVE Sensitive     RIFAMPIN <=0.5 SENSITIVE Sensitive     Inducible Clindamycin NEGATIVE Sensitive     * MODERATE STAPHYLOCOCCUS AUREUS  Culture, Urine     Status: Abnormal   Collection Time: 08/01/19  5:45 PM   Specimen: Urine, Catheterized  Result Value Ref Range Status   Specimen Description   Final    URINE, CATHETERIZED Performed at  [place] [address] [place], [place] [address]    Special Requests   Final    NONE Performed at [facility], [place] [address] [place], [place] [address]    Culture (A)  Final    <10,000 COLONIES/mL INSIGNIFICANT GROWTH Performed at [place] [address], [place],  [address]    Report Status 08/03/2019 FINAL  Final  MRSA PCR Screening     Status: None   Collection Time: 08/02/19 10:02 AM   Specimen: Nasal Mucosa; Nasopharyngeal  Result Value Ref Range Status   MRSA by PCR NEGATIVE NEGATIVE Final  Comment:        The GeneXpert MRSA Assay (FDA approved for NASAL specimens only), is one component of a comprehensive MRSA colonization surveillance program. It is not intended to diagnose MRSA infection nor to guide or monitor treatment for MRSA infections. Performed at [facility], [place] [address], [place], [place] [address]   Culture, blood (routine x 2)     Status: None (Preliminary result)   Collection Time: 08/05/19  5:00 AM   Specimen: BLOOD RIGHT HAND  Result Value Ref Range Status   Specimen Description   Final    BLOOD RIGHT HAND Performed at [place] [address], [place], [place] [address]    Special Requests   Final    BOTTLES DRAWN AEROBIC ONLY Blood Culture adequate volume Performed at [place] [address] [place], [place] [address]    Culture   Final    NO GROWTH 2 DAYS Performed at [facility] [address], [place], [place] [address]    Report Status PENDING  Incomplete  Culture, blood (routine x 2)     Status: None (Preliminary result)   Collection Time: 08/05/19  5:05 AM   Specimen: BLOOD LEFT HAND  Result Value Ref Range Status   Specimen Description   Final    BLOOD LEFT HAND Performed at [place] [address] [place], [place] [address]    Special Requests   Final    BOTTLES  DRAWN AEROBIC ONLY Blood Culture adequate volume Performed at [facility] [address] [place],  [address]    Culture   Final    NO GROWTH 2 DAYS Performed at [place] [address] [place],  [address]    Report Status PENDING  Incomplete         Radiology Studies: US Renal  Result Date: 08/07/2019 CLINICAL DATA:  Acute renal failure. EXAM: RENAL / URINARY TRACT ULTRASOUND COMPLETE COMPARISON:  None. FINDINGS: Right Kidney: Renal measurements: 10.0 x 5.9 x 4.8 cm = volume: 146 mL . Echogenicity within normal limits. No mass or hydronephrosis visualized. Left Kidney: Renal measurements: 11.2 x 6.8 x 5.3 cm = volume: 215 mL. Echogenicity within normal limits. No mass or hydronephrosis visualized. Bladder: Appears normal for degree of bladder distention. Other: None. IMPRESSION: Normal renal ultrasound. Electronically Signed   By: Marijo Conception M.D.   On: 08/07/2019 09:47   Dg Chest Port 1 View  Result Date: 08/06/2019 CLINICAL DATA:  Chest tube, follow-up pneumothorax EXAM: PORTABLE CHEST 1 VIEW COMPARISON:  Chest radiograph from earlier today. FINDINGS: Endotracheal tube tip is 4.2 cm above the carina. Enteric tube enters stomach with the tip not seen on this image. Left internal jugular central venous catheter terminates in the middle third of the SVC. Right subclavian central venous catheter terminates over the right atrium. Stable right pigtail chest tube with tip in the peripheral lower right pleural space. Separate right basilar chest tube. Stable cardiomediastinal silhouette with normal heart size. Small residual right apical pneumothorax, significantly decreased. No left pneumothorax. Midline mediastinum. No pleural effusion. Extensive patchy opacities throughout both lungs appear unchanged. IMPRESSION: 1. Small residual right apical pneumothorax, significantly decreased. 2. Stable extensive patchy opacities throughout both lungs. 3.  Well-positioned support structures as described. Electronically Signed   By: Ilona Sorrel M.D.   On: 08/06/2019 15:25   Dg Chest Port 1 View  Addendum Date: 08/06/2019   ADDENDUM  REPORT: 08/06/2019 13:46 ADDENDUM: These results were called by telephone at the time of interpretation on 08/06/2019 at 1:43 pm to provider Dr. Roselie Awkward, who verbally acknowledged these results. Electronically Signed   By: Aletta Edouard M.D.   On: 08/06/2019 13:46   Result Date: 08/06/2019 CLINICAL DATA:  Replacement of non tunneled dialysis catheter. Respiratory failure secondary to COVID-19 pneumonia. EXAM: PORTABLE CHEST 1 VIEW COMPARISON:  Film earlier today at 1119 hours FINDINGS: Replaced left jugular non tunneled dialysis catheter now projects inferiorly with the tip in the SVC. Other support apparatus stable with the endotracheal tube tip approximately 5 cm above the carina. Right lateral pneumothorax is slightly larger with stable positioning of a lateral pigtail thoracostomy tube. Bilateral severe pneumonia appears stable since the prior chest x-ray. Lucency abutting the right hemidiaphragm may relate to a component subpulmonic air. However, free intraperitoneal air under the right hemidiaphragm is not entirely excluded by semi-erect chest x-ray. IMPRESSION: 1. Replaced left jugular non tunneled dialysis catheter tip is in the SVC. 2. Slightly larger right lateral pneumothorax. 3. Cannot exclude free air under the right hemidiaphragm. However, the appearance may relate to relative lucency of the lung at the right lung base and component of basilar pneumothorax as well. Electronically Signed: By: Aletta Edouard M.D. On: 08/06/2019 13:41   Dg Chest Port 1 View  Result Date: 08/06/2019 CLINICAL DATA:  COVID.  Insertion of dialysis catheter EXAM: PORTABLE CHEST 1 VIEW COMPARISON:  None. FINDINGS: Left internal jugular dialysis catheter courses across the midline and likely passes into the right innominate vein.  Right central line tip is at the cavoatrial junction. Endotracheal tube and NG tube are unchanged. Cardiomegaly. Patchy bilateral airspace opacities again noted, unchanged. Right chest tube remains in place with right basilar pneumothorax, stable. IMPRESSION: Interval placement of left internal jugular Vas-Cath. The tip likely passes into the right innominate vein. No left pneumothorax. Right chest tube remains in place with small right basilar pneumothorax, stable. Stable patchy bilateral airspace opacities. Electronically Signed   By: Rolm Baptise M.D.   On: 08/06/2019 11:56   Dg Chest Port 1 View  Result Date: 08/06/2019 CLINICAL DATA:  68 year old male central line placement. COVID-19. EXAM: PORTABLE CHEST 1 VIEW COMPARISON:  0007 hours today. FINDINGS: Portable AP semi upright view at 0236 hours. The right IJ central line is no longer identified. A right subclavian approach central line now is in place. The tip projects at the level of the right atrium about 2.5 centimeters below the expected level of the cavoatrial junction. ETT tip is at the level the clavicles. Stable visible enteric tube. Stable right chest tube. Small volume right pneumothorax appears stable along with patchy right mid and lower lung opacity and confluent left lung base opacity. The left upper lobe remains clear. Stable cardiac size and mediastinal contours. IMPRESSION: 1. Right subclavian approach central line placed, tip at the level of the right atrium about 2.5 cm below the cavoatrial junction. 2.  Otherwise stable lines and tubes. 3. Stable small volume right pneumothorax, bilateral pneumonia. Electronically Signed   By: Genevie Ann M.D.   On: 08/06/2019 03:49   Dg Chest Port 1 View  Result Date: 08/06/2019 CLINICAL DATA:  PICC line EXAM: PORTABLE CHEST 1 VIEW COMPARISON:  08/05/2019, 08/03/2019, 08/02/2019 FINDINGS: Endotracheal tube tip is about 3.3 cm superior to the carina. Esophageal tube tip below the diaphragm but non  included. Right lower chest tube remains in place. Residual small pneumothorax at the right lower lateral  chest, CP angle and lung base. No upper extremity venous catheters are seen. There is catheter tubing coiled over the right neck/thoracic inlet. Multifocal bilateral consolidations are grossly unchanged, worst in the left base. Stable cardiomediastinal silhouette. IMPRESSION: 1. No upper extremity venous catheter is seen. Possible catheter tubing looped over the right neck 2. Small right lateral and basilar pneumothorax slightly more apparent compared to most recent prior 3. Stable multifocal airspace consolidations. Electronically Signed   By: Donavan Foil M.D.   On: 08/06/2019 00:38   Dg Chest Port 1 View  Result Date: 08/05/2019 CLINICAL DATA:  Acute respiratory failure EXAM: PORTABLE CHEST 1 VIEW COMPARISON:  08/05/2019 FINDINGS: Endotracheal tube appropriately positioned in the mid trachea approximately 4 cm from the carina. Transesophageal tube tip and side port distal to the GE junction, terminating near the gastric antrum. Pigtail right pleural catheter is in stable position. Additional support devices and monitoring leads overlie the chest. There is a small residual right apical pneumothorax (see annotated image). There is increasing dense bilateral airspace opacity towards the bases which could reflect a combination of atelectasis and residual consolidated lung. No acute osseous or soft tissue abnormality. IMPRESSION: 1. Stable satisfactory positioning of lines and tubes. 2. Small residual right apical pneumothorax. 3. Increasing dense bilateral airspace disease towards the bases, which could reflect a combination of worsening atelectasis and residual consolidated lung. Electronically Signed   By: Lovena Le M.D.   On: 08/05/2019 23:09   Dg Abd Decub  Result Date: 08/06/2019 CLINICAL DATA:  Rule out pneumoperitoneum EXAM: ABDOMEN - 1 VIEW DECUBITUS COMPARISON:  08/02/2019 abdominal  radiographs FINDINGS: No evidence of pneumatosis or pneumoperitoneum. No disproportionately dilated small bowel loops. Moderate gas in the nondependent colon. Enteric tube tip is seen in the proximal stomach. IMPRESSION: 1. No evidence of pneumoperitoneum. 2. Moderate gas in the nondependent colon. Nonobstructive bowel gas pattern. 3. Enteric tube tip in the proximal stomach. Electronically Signed   By: Ilona Sorrel M.D.   On: 08/06/2019 17:16        Scheduled Meds:  sodium chloride   Intravenous Once   chlorhexidine  15 mL Mouth/Throat BID   Chlorhexidine Gluconate Cloth  6 each Topical Daily   feeding supplement (PRO-STAT SUGAR FREE 64)  60 mL Per Tube TID   free water  125 mL Per Tube Q4H   hydrocortisone sodium succinate  50 mg Intravenous Q6H   insulin aspart  0-9 Units Subcutaneous Q4H   mouth rinse  15 mL Mouth Rinse 10 times per day   oxyCODONE  5 mg Oral Q6H   pantoprazole  40 mg Intravenous Q12H   polyethylene glycol  17 g Per Tube BID   QUEtiapine  25 mg Oral BID   sennosides  5 mL Per Tube BID   tamsulosin  0.4 mg Oral BID   Continuous Infusions:   prismasol BGK 4/2.5 500 mL/hr at 08/07/19 1245    prismasol BGK 4/2.5 500 mL/hr at 08/07/19 1245   sodium chloride Stopped (08/01/19 0957)   sodium chloride 10 mL/hr at 08/07/19 1800   amiodarone Stopped (08/07/19 1715)   anidulafungin Stopped (08/07/19 1014)   ceFEPime (MAXIPIME) IV     epinephrine     feeding supplement (PIVOT 1.5 CAL) 1,000 mL (08/07/19 1758)   fentaNYL infusion INTRAVENOUS Stopped (08/07/19 1702)   midazolam Stopped (08/05/19 0737)   norepinephrine (LEVOPHED) Adult infusion 60 mcg/min (08/07/19 1800)   phenylephrine (NEO-SYNEPHRINE) Adult infusion Stopped (08/06/19 1643)   prismasol BGK 4/2.5 2,000  mL/hr at 08/07/19 1805   vasopressin (PITRESSIN) infusion - *FOR SHOCK* Stopped (08/07/19 1742)     LOS: 13 days   The patient is critically ill with multiple organ systems  failure and requires high complexity decision making for assessment and support, frequent evaluation and titration of therapies, application of advanced monitoring technologies and extensive interpretation of multiple databases. Critical Care Time devoted to patient care services described in this note  Time spent: 40 minutes     Jennesis Ramaswamy, Geraldo Docker, MD [facility] Pager [phone]  If 7PM-7AM, please contact night-coverage [email or web address] Password [facility] 08/07/2019, 6:57 PM

## 2019-08-07 NOTE — Progress Notes (Signed)
NAME:  [name], MRN:  [id number], DOB:  [birth year], LOS: 16 ADMISSION DATE:  07/25/2019, CONSULTATION DATE:  10/3 REFERRING MD:  Bridgett Larsson, CHIEF COMPLAINT:  Dyspnea   Brief History   68 y/o male admitted on 10/3 from the [facility] ED with ARDS from COVID pneumonia leading to need for mechanical ventilation.    Past Medical History  COVID 19 Heartburn  Significant Hospital Events   10/3 admission 10/10 weaned 12 hours 10/11 dark stool 10/13 pneumothorax  Consults:  PCCM  Procedures:  10/2 ETT>  10/2 R IJ CVL > 10/11 10/14 R chest tube >  10/14 R subclavian CVL >  10/14 L IJ HD cath >   Significant Diagnostic Tests:  10/2 CT angiogram chest > personally reviewed, extensive bilateral airspace disease predominantly posterior  Micro Data:  9/20 SARS COV 2 > POSITIVE 10/2 blood >  10/9 blood > MSSA 2/4 10/9 urine >  10/9 resp > MSSA, psuedomonas  10/13 blood >  10/14 blood >   Antimicrobials/COVID Rx  10/3 remdesivir > 10/6 10/3 actemra 10/3 decadron > 10/4 convalescent plasma, repeat 10/6  10/9 vanc > 10/10 10/9 cefepime > 10/10 10/10 Ancef >  10/13 Cefepime >  10/14 anidulofungin>   Interim history/subjective:    Worsening respiratory acidosis overnight Worsening air leak  Objective   Blood pressure 95/67, pulse (!) 145, temperature 99 F (37.2 C), temperature source Oral, resp. rate 13, height 5\' 10"  (1.778 m), weight 90.1 kg, SpO2 95 %. CVP:  [6 mmHg-11 mmHg] 11 mmHg  Vent Mode: PCV FiO2 (%):  [100 %] 100 % Set Rate:  [24 bmp-35 bmp] 35 bmp PEEP:  [10 cmH20] 10 cmH20 Plateau Pressure:  [23 cmH20] 23 cmH20   Intake/Output Summary (Last 24 hours) at 08/07/2019 0756 Last data filed at 08/07/2019 0700 Gross per 24 hour  Intake 6510.44 ml  Output 1621 ml  Net 4889.44 ml   Filed Weights   08/03/19 0500 08/06/19 0500 08/07/19 0320  Weight: 90 kg 87.6 kg 90.1 kg    Examination:  General:  In bed on vent HENT: NCAT ETT in place PULM: Gurgling  sound r lung B, vent supported breathing CV: RRR, no mgr GI: BS+, soft, nontender MSK: normal bulk and tone Neuro: sedated on vent   10/14 CXR images personally reviewed: R pigtail, chest tube in place  Resolved Hospital Problem list     Assessment & Plan:  Severe ARDS due to COVID 19 pneumonia: improving oxygenation and ventilator mechanics Severe air leak from R lung, losing approximately 50% of minute ventilation with rising hypercarbia, worsening with life threatening respiratory acidosis Will place bronchial blocker to prevent further air loss through chest tube and facilitate normal lung ventilation; emergent, life threatening procedure.  I explained the risks and benefits to his wife who is willing to proceed. Continue mechanical ventilation per ARDS protocol Target TVol 6-8cc/kgIBW Target Plateau Pressure < 30cm H20 Target driving pressure less than 15 cm of water Target PaO2 55-65: titrate PEEP/FiO2 per protocol As long as PaO2 to FiO2 ratio is less than 1:150 position in prone position for 16 hours a day Check CVP daily if CVL in place Target CVP less than 4, diurese as necessary Ventilator associated pneumonia prevention protocol   Pneumothorax on R with large air leak See details above about endobronchial blocker If successful, will need endobronchial valve Chest tube to suction  AKI: worsening, oliguric Monitor BMET and UOP Replace electrolytes as needed CVVHD to start today  Anticoagulation/DVT prevention  in setting of thrombophilia from COVID 19 Slow GI bleed? Continue protonix bid Monitor CBC  MSSA bacteremia: Septic shock 10/13> pseudomonas pneumonia? Other systemic infection? Fungal? Continue broad spectrum antibiotics Continue anidulofungin Continue to wean off epinephrine, levophed, vasopressin Albumin today  Need for sedation/mechanical ventilation: severe agitation when weaning sedation RASS target -1 to -2 Oxycodone, seroquel Continue  fentanyl, versed infusion   Best practice:  Diet: tube feeding Pain/Anxiety/Delirium protocol (if indicated): as above VAP protocol (if indicated): yes DVT prophylaxis: as above GI prophylaxis: famotidine Glucose control: SSI Mobility: bed rest Code Status: full Family Communication: I spoke to his wife [name] at length today and she gave consent for endobronchial blocker placement Disposition: remain in ICU  Labs   CBC: Recent Labs  Lab 08/04/19 0535  08/05/19 0550  08/06/19 0610  08/06/19 1750 08/06/19 2343 08/07/19 0245 08/07/19 0500 08/07/19 0552  WBC 26.3*  --  31.9*  --  14.8*  --  15.9*  --   --  12.1*  --   NEUTROABS  --   --   --   --   --   --   --   --   --  11.3*  --   HGB 11.3*   < > 11.9*   < > 9.8*   < > 9.5* 8.8* 8.8* 8.1* 8.5*  HCT 36.5*   < > 38.7*   < > 32.9*   < > 31.3* 26.0* 26.0* 27.2* 25.0*  MCV 99.5  --  99.7  --  103.1*  --  103.3*  --   --  103.4*  --   PLT 373  --  395  --  259  --  245  --   --  204  --    < > = values in this interval not displayed.    Basic Metabolic Panel: Recent Labs  Lab 08/01/19 0500  08/02/19 0457  08/03/19 1035  08/04/19 0535  08/05/19 0550  08/06/19 0610 08/06/19 1639 08/06/19 2343 08/07/19 0245 08/07/19 0500 08/07/19 0552  NA 146*   < > 145   < > 144   < > 148*   < > 149*   < > 149* 148* 146* 147* 146* 145  K 4.1   < > 4.3   < > 4.8   < > 5.0   < > 4.3   < > 4.2 4.7 4.8 4.8 5.1 4.9  CL 93*  --  99  --  98  --  100  --  103  --  108  --   --   --  103  --   CO2 35*  --  35*  --  35*  --  34*  --  29  --  28  --   --   --  27  --   GLUCOSE 144*  --  157*  --  134*  --  130*  --  186*  --  229*  --   --   --  362*  --   BUN 77*  --  74*  --  99*  --  104*  --  129*  --  164*  --   --   --  PENDING  --   CREATININE 1.96*  --  1.54*  --  1.61*  --  2.09*  --  2.78*  --  4.25*  --   --   --  5.44*  --   CALCIUM 9.2  --  9.4  --  9.4  --  9.2  --  8.8*  --  7.6*  --   --   --  7.1*  --   MG 2.6*  --  3.1*  --   --    --   --   --   --   --   --   --   --   --  4.0*  --   PHOS 5.7*  --  3.8  --   --   --   --   --   --   --   --   --   --   --   --   --    < > = values in this interval not displayed.   GFR: Estimated Creatinine Clearance: 15.1 mL/min (A) (by C-G formula based on SCr of 5.44 mg/dL (H)). Recent Labs  Lab 08/01/19 0943  08/05/19 0550 08/06/19 0610 08/06/19 1750 08/07/19 0500  PROCALCITON 2.20  --   --   --   --   --   WBC  --    < > 31.9* 14.8* 15.9* 12.1*   < > = values in this interval not displayed.    Liver Function Tests: Recent Labs  Lab 08/03/19 1035 08/04/19 0535 08/05/19 0550 08/06/19 0610 08/07/19 0500  AST 21 27 31 22 28   ALT 28 22 19 9 6   ALKPHOS 94 121 104 45 65  BILITOT 0.7 0.5 0.6 1.0 0.3  PROT 6.5 6.8 6.8 5.3* 5.0*  ALBUMIN 3.5 3.5 3.5 3.2* 2.5*   No results for input(s): LIPASE, AMYLASE in the last 168 hours. No results for input(s): AMMONIA in the last 168 hours.  ABG    Component Value Date/Time   PHART 7.112 (LL) 08/07/2019 0552   PCO2ART 90.0 (HH) 08/07/2019 0552   PO2ART 81.0 (L) 08/07/2019 0552   HCO3 28.7 (H) 08/07/2019 0552   TCO2 31 08/07/2019 0552   ACIDBASEDEF 2.0 08/07/2019 0552   O2SAT 90.0 08/07/2019 0552     Coagulation Profile: No results for input(s): INR, PROTIME in the last 168 hours.  Cardiac Enzymes: No results for input(s): CKTOTAL, CKMB, CKMBINDEX, TROPONINI in the last 168 hours.  HbA1C: Hgb A1c MFr Bld  Date/Time Value Ref Range Status  07/26/2019 05:00 AM 6.1 (H) 4.8 - 5.6 % Final    Comment:    (NOTE) Pre diabetes:          5.7%-6.4% Diabetes:              >6.4% Glycemic control for   <7.0% adults with diabetes     CBG: Recent Labs  Lab 08/06/19 1309 08/06/19 1639 08/06/19 2114 08/06/19 2342 08/07/19 0455  GLUCAP 222* 183* 256* 297* 314*     Critical care time: 40 minutes     Roselie Awkward, MD [facility] Pager: [phone] Cell: [phone] If no response, call [phone]

## 2019-08-07 NOTE — H&P (Signed)
Referring Provider: No ref. provider found Primary Care Physician:  Kathyrn Drown, MD Primary Nephrologist:     Reason for Consultation: Acute kidney injury, metabolic acidosis, maintenance of euvolemia, treatment of electrolyte and acid-base abnormalities  HPI: This is a 68 year old gentleman history of reflux diagnosed with COVID-19 on July 13, 2019.  Patient became acutely short of breath and was admitted from the emergency room 07/25/2019.  With diffuse multifocal infiltrates a CT of the chest that was negative for pulmonary embolus.  He completed remdesivir 07/30/2019.  He had 1 unit of convalescent plasma and had received Decadron until 08/04/2019.  He was also treated with vancomycin and cefepime for possible pneumonia from 08/01/2019 until 08/02/2019 On 08/02/2019 it appears that he developed an ileus with negative imaging of his abdomen  07/25/2019 creatinine was found to be 1.5.  This appears to be his baseline.  It remained in this range until 08/02/2019 when it started to increase.  His most recent creatinine is 5.44 08/07/2019 with a dwindling urine output.  On 08/02/2019 he was found to have MSSA bacteremia and started on Ancef.  He was thought to be in significant volume overload 08/03/2019 and was treated with IV diuresis.  Blood pressure 94/44 pulse 125 temperature 100.2 O2 sats 100% FiO2 100%  Sodium 145 potassium 4.9 chloride 103 CO2 27 BUN 185 creatinine 5.44 glucose 362 calcium 7.1 phosphorus 11.2 magnesium 4.0 albumin 2.5.  WBC 12.1 hemoglobin 8.5 platelets 204  Urine output 1 L.  Positive fluid balance to 90.1 kg positive about 10 L.  IV amiodarone 30 mg an hour IV epinephrine IV Levophed IV sodium bicarbonate  hydrocortisone 50 mg every 6 hours, oxycodone 5 mg every 6 hours, Protonix 40 mg every 12 hours, Flomax 0.4 mg daily, Eraxis 100 mg every 24 hours, cefepime 2 g every 24 hours,   Lasix injections 40 mg every 6 hours 07/27/2019 to 08/02/2019.  Hemodynamics drop  in blood pressure significant 1013 2020 to 70 mmHg systolic  Renal ultrasound revealed no evidence of hydronephrosis 08/07/2019 Administration of intravenous contrast 07/25/2019  Past Medical History:  Diagnosis Date  . COVID-19   . Heartburn     Past Surgical History:  Procedure Laterality Date  . ACHILLES TENDON SURGERY Right 2012  . COLONOSCOPY  2009   [facility]  . ESOPHAGOGASTRODUODENOSCOPY N/A 12/25/2014   Procedure: ESOPHAGOGASTRODUODENOSCOPY (EGD);  Surgeon: Danie Binder, MD;  Location: [facility];  Service: Endoscopy;  Laterality: N/A;  830am  . KNEE SURGERY Left 1980's  . none    . WRIST SURGERY Left 1970's    Prior to Admission medications   Medication Sig Start Date End Date Taking? Authorizing Provider  ondansetron (ZOFRAN ODT) 4 MG disintegrating tablet Take 1 tablet (4 mg total) by mouth every 8 (eight) hours as needed for nausea or vomiting. 07/21/19  Yes Kathyrn Drown, MD    Current Facility-Administered Medications  Medication Dose Route Frequency Provider Last Rate Last Dose  . 0.9 %  sodium chloride infusion (Manually program via Guardrails IV Fluids)   Intravenous Once Allie Bossier, MD      . 0.9 %  sodium chloride infusion   Intra-arterial PRN Kristopher Oppenheim, DO   Stopped at 08/01/19 0957  . 0.9 %  sodium chloride infusion   Intra-arterial PRN Shellia Cleverly, MD   Stopped at 08/07/19 [phone]  . acetaminophen (TYLENOL) solution 650 mg  650 mg Per Tube Q4H PRN Little Ishikawa, MD   650 mg at  08/07/19 0107  . acetaminophen (TYLENOL) suppository 650 mg  650 mg Rectal Q4H PRN Little Ishikawa, MD   650 mg at 08/04/19 1755  . amiodarone (NEXTERONE PREMIX) 360-4.14 MG/200ML-% (1.8 mg/mL) IV infusion  30 mg/hr Intravenous Continuous Frederik Pear, MD 16.67 mL/hr at 08/07/19 1000 30 mg/hr at 08/07/19 1000  . anidulafungin (ERAXIS) 100 mg in sodium chloride 0.9 % 100 mL IVPB  100 mg Intravenous Q24H Allie Bossier, MD 78 mL/hr at 08/07/19 1000    . ceFEPIme  (MAXIPIME) 2 g in sodium chloride 0.9 % 100 mL IVPB  2 g Intravenous Q24H Allie Bossier, MD   Stopped at 08/07/19 [phone]  . chlorhexidine (PERIDEX) 0.12 % solution 15 mL  15 mL Mouth/Throat BID Little Ishikawa, MD   15 mL at 08/07/19 0801  . Chlorhexidine Gluconate Cloth 2 % PADS 6 each  6 each Topical Daily Kristopher Oppenheim, DO   6 each at 08/07/19 0400  . EPINEPHrine (ADRENALIN) 8 mg in dextrose 5 % 250 mL (0.032 mg/mL) infusion  0.5-20 mcg/min Intravenous Titrated Allie Bossier, MD      . feeding supplement (PIVOT 1.5 CAL) liquid 1,000 mL  1,000 mL Per Tube Continuous Juanito Doom, MD 30 mL/hr at 08/07/19 [phone]    . feeding supplement (PRO-STAT SUGAR FREE 64) liquid 60 mL  60 mL Per Tube TID Simonne Maffucci B, MD   60 mL at 08/07/19 0925  . fentaNYL (SUBLIMAZE) bolus via infusion 25 mcg  25 mcg Intravenous Q15 min PRN Juanito Doom, MD   25 mcg at 08/05/19 [phone]  . fentaNYL 2558mcg in NS 224mL (73mcg/ml) infusion-PREMIX  25-200 mcg/hr Intravenous Continuous Juanito Doom, MD   Stopped at 08/07/19 0755  . free water 125 mL  125 mL Per Tube Q4H Little Ishikawa, MD   125 mL at 08/07/19 0802  . heparin injection 1,000-6,000 Units  1,000-6,000 Units CRRT PRN Juanito Doom, MD   2,800 Units at 08/06/19 1151  . hydrocortisone sodium succinate (SOLU-CORTEF) 100 MG injection 50 mg  50 mg Intravenous Q6H Simonne Maffucci B, MD   50 mg at 08/07/19 0925  . influenza vaccine adjuvanted (FLUAD) injection 0.5 mL  0.5 mL Intramuscular Prior to discharge Little Ishikawa, MD      . insulin aspart (novoLOG) injection 0-9 Units  0-9 Units Subcutaneous Q4H Allie Bossier, MD   5 Units at 08/07/19 0750  . MEDLINE mouth rinse  15 mL Mouth Rinse 10 times per day Kristopher Oppenheim, DO   15 mL at 08/07/19 0930  . midazolam (VERSED) 50 mg/50 mL (1 mg/mL) premix infusion  0-10 mg/hr Intravenous Continuous Juanito Doom, MD   Stopped at 08/05/19 [phone]  . midazolam (VERSED) bolus via infusion 1-2 mg   1-2 mg Intravenous Q2H PRN Juanito Doom, MD   2 mg at 08/07/19 0112  . norepinephrine (LEVOPHED) 16 mg in 247mL premix infusion  0-60 mcg/min Intravenous Titrated Frederik Pear, MD 51.6 mL/hr at 08/07/19 1000 55 mcg/min at 08/07/19 1000  . oxyCODONE (Oxy IR/ROXICODONE) immediate release tablet 5 mg  5 mg Oral Q6H Allie Bossier, MD   5 mg at 08/07/19 9678  . pantoprazole (PROTONIX) injection 40 mg  40 mg Intravenous Q12H Little Ishikawa, MD   40 mg at 08/07/19 9381  . phenylephrine (NEOSYNEPHRINE) 10-0.9 MG/250ML-% infusion  0-400 mcg/min Intravenous Titrated Reubin Milan, MD   Stopped at 08/06/19 1643  . pneumococcal  23 valent vaccine (PNU-IMMUNE) injection 0.5 mL  0.5 mL Intramuscular Prior to discharge Little Ishikawa, MD      . polyethylene glycol (MIRALAX / GLYCOLAX) packet 17 g  17 g Per Tube BID Little Ishikawa, MD   17 g at 08/07/19 0925  . QUEtiapine (SEROQUEL) tablet 25 mg  25 mg Oral BID Simonne Maffucci B, MD   25 mg at 08/07/19 0925  . sennosides (SENOKOT) 8.8 MG/5ML syrup 5 mL  5 mL Per Tube BID Little Ishikawa, MD   5 mL at 08/07/19 0925  . tamsulosin (FLOMAX) capsule 0.4 mg  0.4 mg Oral BID Allie Bossier, MD   0.4 mg at 08/07/19 1975  . vasopressin (PITRESSIN) 40 Units in sodium chloride 0.9 % 250 mL (0.16 Units/mL) infusion  0.03 Units/min Intravenous Continuous Ogan, [place] U, MD 11.25 mL/hr at 08/07/19 1000 0.03 Units/min at 08/07/19 1000    Allergies as of 07/25/2019  . (No Known Allergies)    Family History  Problem Relation Age of Onset  . Colon cancer Neg Hx   . Colon polyps Neg Hx     Social History   Socioeconomic History  . Marital status: Married    Spouse name: Not on file  . Number of children: Not on file  . Years of education: bachelors  . Highest education level: Not on file  Occupational History  . Occupation: Scientist, clinical (histocompatibility and immunogenetics): [employer]  Social Needs  . Financial resource strain: Not on file  . Food  insecurity    Worry: Not on file    Inability: Not on file  . Transportation needs    Medical: Not on file    Non-medical: Not on file  Tobacco Use  . Smoking status: Never Smoker  . Smokeless tobacco: Never Used  Substance and Sexual Activity  . Alcohol use: Yes    Alcohol/week: 2.0 standard drinks    Types: 2 Shots of liquor per week    Comment: moderate  . Drug use: No  . Sexual activity: Not on file  Lifestyle  . Physical activity    Days per week: Not on file    Minutes per session: Not on file  . Stress: Not on file  Relationships  . Social Herbalist on phone: Not on file    Gets together: Not on file    Attends religious service: Not on file    Active member of club or organization: Not on file    Attends meetings of clubs or organizations: Not on file    Relationship status: Not on file  . Intimate partner violence    Fear of current or ex partner: Not on file    Emotionally abused: Not on file    Physically abused: Not on file    Forced sexual activity: Not on file  Other Topics Concern  . Not on file  Social History Narrative  . Not on file    Review of Systems:   Physical Exam: Vital signs in last 24 hours: Temp:  [98.8 F (37.1 C)-100.2 F (37.9 C)] 100.2 F (37.9 C) (10/15 1000) Pulse Rate:  [28-187] 125 (10/15 1015) Resp:  [13-38] 15 (10/15 1015) BP: (86-127)/(39-82) 127/55 (10/15 0800) SpO2:  [87 %-100 %] 100 % (10/15 1015) Arterial Line BP: (61-132)/(33-54) 94/44 (10/15 1015) FiO2 (%):  [100 %] 100 % (10/15 0800) Weight:  [90.1 kg] 90.1 kg (10/15 0320) Last BM Date: 08/06/19 General:  Ill-appearing gentleman on ventilator Head:  Normocephalic and atraumatic. Eyes:  Sclera clear, no icterus.   Conjunctiva pink. Ears:  Normal auditory acuity. Nose:  No deformity, discharge,  or lesions. Mouth:  No deformity or lesions, dentition normal.  Endotracheal tube in place Neck:  Supple; no masses or thyromegaly. JVP not elevated Lungs:  Rhonchi on right when supported breathing Heart:  Regular rate and rhythm; no murmurs, clicks, rubs,  or gallops. Abdomen:  Soft, nontender and nondistended. No masses, hepatosplenomegaly or hernias noted. Normal bowel sounds, without guarding, and without rebound.   Msk:  Symmetrical without gross deformities. Normal posture. Pulses:  No carotid, renal, femoral bruits. DP and PT symmetrical and equal Extremities:  Without clubbing or edema.   Intake/Output from previous day: 10/14 0701 - 10/15 0700 In: 6510.4 [I.V.:4081.1; NG/GT:2329.3; IV Piggyback:100] Out: 1621 [Urine:755; Chest Tube:866] Intake/Output this shift: Total I/O In: 807 [I.V.:446.8; NG/GT:213.7; IV Piggyback:146.6] Out: 120 [Urine:120]  Lab Results: Recent Labs    08/06/19 0610  08/06/19 1750  08/07/19 0245 08/07/19 0500 08/07/19 0552  WBC 14.8*  --  15.9*  --   --  12.1*  --   HGB 9.8*   < > 9.5*   < > 8.8* 8.1* 8.5*  HCT 32.9*   < > 31.3*   < > 26.0* 27.2* 25.0*  PLT 259  --  245  --   --  204  --    < > = values in this interval not displayed.   BMET Recent Labs    08/05/19 0550  08/06/19 0610  08/07/19 0245 08/07/19 0500 08/07/19 0552  NA 149*   < > 149*   < > 147* 146* 145  K 4.3   < > 4.2   < > 4.8 5.1 4.9  CL 103  --  108  --   --  103  --   CO2 29  --  28  --   --  27  --   GLUCOSE 186*  --  229*  --   --  362*  --   BUN 129*  --  164*  --   --  185*  --   CREATININE 2.78*  --  4.25*  --   --  5.44*  --   CALCIUM 8.8*  --  7.6*  --   --  7.1*  --   PHOS  --   --   --   --   --  11.2*  --    < > = values in this interval not displayed.   LFT Recent Labs    08/07/19 0500  PROT 5.0*  ALBUMIN 2.5*  AST 28  ALT 6  ALKPHOS 65  BILITOT 0.3   PT/INR No results for input(s): LABPROT, INR in the last 72 hours. Hepatitis Panel No results for input(s): HEPBSAG, HCVAB, HEPAIGM, HEPBIGM in the last 72 hours.  Studies/Results: US Renal  Result Date: 08/07/2019 CLINICAL DATA:  Acute renal  failure. EXAM: RENAL / URINARY TRACT ULTRASOUND COMPLETE COMPARISON:  None. FINDINGS: Right Kidney: Renal measurements: 10.0 x 5.9 x 4.8 cm = volume: 146 mL . Echogenicity within normal limits. No mass or hydronephrosis visualized. Left Kidney: Renal measurements: 11.2 x 6.8 x 5.3 cm = volume: 215 mL. Echogenicity within normal limits. No mass or hydronephrosis visualized. Bladder: Appears normal for degree of bladder distention. Other: None. IMPRESSION: Normal renal ultrasound. Electronically Signed   By: Marijo Conception M.D.   On: 08/07/2019 09:47  Dg Chest Port 1 View  Result Date: 08/06/2019 CLINICAL DATA:  Chest tube, follow-up pneumothorax EXAM: PORTABLE CHEST 1 VIEW COMPARISON:  Chest radiograph from earlier today. FINDINGS: Endotracheal tube tip is 4.2 cm above the carina. Enteric tube enters stomach with the tip not seen on this image. Left internal jugular central venous catheter terminates in the middle third of the SVC. Right subclavian central venous catheter terminates over the right atrium. Stable right pigtail chest tube with tip in the peripheral lower right pleural space. Separate right basilar chest tube. Stable cardiomediastinal silhouette with normal heart size. Small residual right apical pneumothorax, significantly decreased. No left pneumothorax. Midline mediastinum. No pleural effusion. Extensive patchy opacities throughout both lungs appear unchanged. IMPRESSION: 1. Small residual right apical pneumothorax, significantly decreased. 2. Stable extensive patchy opacities throughout both lungs. 3. Well-positioned support structures as described. Electronically Signed   By: Ilona Sorrel M.D.   On: 08/06/2019 15:25   Dg Chest Port 1 View  Addendum Date: 08/06/2019   ADDENDUM REPORT: 08/06/2019 13:46 ADDENDUM: These results were called by telephone at the time of interpretation on 08/06/2019 at 1:43 pm to provider Dr. Roselie Awkward, who verbally acknowledged these results.  Electronically Signed   By: Aletta Edouard M.D.   On: 08/06/2019 13:46   Result Date: 08/06/2019 CLINICAL DATA:  Replacement of non tunneled dialysis catheter. Respiratory failure secondary to COVID-19 pneumonia. EXAM: PORTABLE CHEST 1 VIEW COMPARISON:  Film earlier today at 1119 hours FINDINGS: Replaced left jugular non tunneled dialysis catheter now projects inferiorly with the tip in the SVC. Other support apparatus stable with the endotracheal tube tip approximately 5 cm above the carina. Right lateral pneumothorax is slightly larger with stable positioning of a lateral pigtail thoracostomy tube. Bilateral severe pneumonia appears stable since the prior chest x-ray. Lucency abutting the right hemidiaphragm may relate to a component subpulmonic air. However, free intraperitoneal air under the right hemidiaphragm is not entirely excluded by semi-erect chest x-ray. IMPRESSION: 1. Replaced left jugular non tunneled dialysis catheter tip is in the SVC. 2. Slightly larger right lateral pneumothorax. 3. Cannot exclude free air under the right hemidiaphragm. However, the appearance may relate to relative lucency of the lung at the right lung base and component of basilar pneumothorax as well. Electronically Signed: By: Aletta Edouard M.D. On: 08/06/2019 13:41   Dg Chest Port 1 View  Result Date: 08/06/2019 CLINICAL DATA:  COVID.  Insertion of dialysis catheter EXAM: PORTABLE CHEST 1 VIEW COMPARISON:  None. FINDINGS: Left internal jugular dialysis catheter courses across the midline and likely passes into the right innominate vein. Right central line tip is at the cavoatrial junction. Endotracheal tube and NG tube are unchanged. Cardiomegaly. Patchy bilateral airspace opacities again noted, unchanged. Right chest tube remains in place with right basilar pneumothorax, stable. IMPRESSION: Interval placement of left internal jugular Vas-Cath. The tip likely passes into the right innominate vein. No left  pneumothorax. Right chest tube remains in place with small right basilar pneumothorax, stable. Stable patchy bilateral airspace opacities. Electronically Signed   By: Rolm Baptise M.D.   On: 08/06/2019 11:56   Dg Chest Port 1 View  Result Date: 08/06/2019 CLINICAL DATA:  68 year old male central line placement. COVID-19. EXAM: PORTABLE CHEST 1 VIEW COMPARISON:  0007 hours today. FINDINGS: Portable AP semi upright view at 0236 hours. The right IJ central line is no longer identified. A right subclavian approach central line now is in place. The tip projects at the level of the right atrium  about 2.5 centimeters below the expected level of the cavoatrial junction. ETT tip is at the level the clavicles. Stable visible enteric tube. Stable right chest tube. Small volume right pneumothorax appears stable along with patchy right mid and lower lung opacity and confluent left lung base opacity. The left upper lobe remains clear. Stable cardiac size and mediastinal contours. IMPRESSION: 1. Right subclavian approach central line placed, tip at the level of the right atrium about 2.5 cm below the cavoatrial junction. 2.  Otherwise stable lines and tubes. 3. Stable small volume right pneumothorax, bilateral pneumonia. Electronically Signed   By: Genevie Ann M.D.   On: 08/06/2019 03:49   Dg Chest Port 1 View  Result Date: 08/06/2019 CLINICAL DATA:  PICC line EXAM: PORTABLE CHEST 1 VIEW COMPARISON:  08/05/2019, 08/03/2019, 08/02/2019 FINDINGS: Endotracheal tube tip is about 3.3 cm superior to the carina. Esophageal tube tip below the diaphragm but non included. Right lower chest tube remains in place. Residual small pneumothorax at the right lower lateral chest, CP angle and lung base. No upper extremity venous catheters are seen. There is catheter tubing coiled over the right neck/thoracic inlet. Multifocal bilateral consolidations are grossly unchanged, worst in the left base. Stable cardiomediastinal silhouette.  IMPRESSION: 1. No upper extremity venous catheter is seen. Possible catheter tubing looped over the right neck 2. Small right lateral and basilar pneumothorax slightly more apparent compared to most recent prior 3. Stable multifocal airspace consolidations. Electronically Signed   By: Donavan Foil M.D.   On: 08/06/2019 00:38   Dg Chest Port 1 View  Result Date: 08/05/2019 CLINICAL DATA:  Acute respiratory failure EXAM: PORTABLE CHEST 1 VIEW COMPARISON:  08/05/2019 FINDINGS: Endotracheal tube appropriately positioned in the mid trachea approximately 4 cm from the carina. Transesophageal tube tip and side port distal to the GE junction, terminating near the gastric antrum. Pigtail right pleural catheter is in stable position. Additional support devices and monitoring leads overlie the chest. There is a small residual right apical pneumothorax (see annotated image). There is increasing dense bilateral airspace opacity towards the bases which could reflect a combination of atelectasis and residual consolidated lung. No acute osseous or soft tissue abnormality. IMPRESSION: 1. Stable satisfactory positioning of lines and tubes. 2. Small residual right apical pneumothorax. 3. Increasing dense bilateral airspace disease towards the bases, which could reflect a combination of worsening atelectasis and residual consolidated lung. Electronically Signed   By: Lovena Le M.D.   On: 08/05/2019 23:09   Dg Abd Decub  Result Date: 08/06/2019 CLINICAL DATA:  Rule out pneumoperitoneum EXAM: ABDOMEN - 1 VIEW DECUBITUS COMPARISON:  08/02/2019 abdominal radiographs FINDINGS: No evidence of pneumatosis or pneumoperitoneum. No disproportionately dilated small bowel loops. Moderate gas in the nondependent colon. Enteric tube tip is seen in the proximal stomach. IMPRESSION: 1. No evidence of pneumoperitoneum. 2. Moderate gas in the nondependent colon. Nonobstructive bowel gas pattern. 3. Enteric tube tip in the proximal stomach.  Electronically Signed   By: Ilona Sorrel M.D.   On: 08/06/2019 17:16    Assessment/Plan:  Acute kidney injury with a baseline of chronic kidney disease with baseline serum creatinine about 1.5 mg/dL.  Admitted with Covid pneumonia complicated course with recent history of MSSA bacteremia.  Developed shock hypotension and requiring pressors.  No evidence of hydronephrosis on renal ultrasound.  This suggests acute tubular necrosis.  Could not identify any additional nephrotoxins.  He did receive vancomycin for 1 day.  There has been administration of contrast but this was  early on the course of his illness on 07/25/2019.  His prognosis is extremely poor.  He has been developing progressive metabolic acidosis and we will initiate CRRT 92/52/4159  Metabolic acidosis this appears to be resolved.  Discontinue IV bicarbonate  Anemia as per primary team transfuse as necessary no transfusion requirements at this time  COVID-19 pneumonia.  Treated with remdesivir, dexamethasone and convalescent plasma.  Shock and hypotension continue pressors  MSSA bacteremia treatment with Ancef.  Adding Eraxis for increasing coverage.   LOS: [place] @TODAY @10 :24 AM

## 2019-08-07 NOTE — Progress Notes (Signed)
Dr. Lucile Shutters notified of f/u ABG results and pt's pressor requirements. Said to try changing vent settings to [facility] (currently on PC). RT notified, they will come make that change.  MD to review pt's chart before modifying anything else.  Will continue to monitor.

## 2019-08-07 NOTE — Progress Notes (Signed)
[facility] Progress Note Patient Name: [name] DOB: [birth year] MRN: [id number]   Date of Service  08/07/2019  HPI/Events of Note  Pt with progressive worsening of respiratory acidosis despite trial of paralytic and a respiratory rate of 35 with correspondingly high minute ventilation. IT may be that Bicarbonate infusion is contributing an additional Co2 load that his lungs are unable to clear, worsening acidosis.  eICU Interventions  Will d/c Bicarbonate infusion and repeat ABG in two hours.        Arminda Foglio U Demarkus Remmel 08/07/2019, 3:30 AM

## 2019-08-07 NOTE — Plan of Care (Signed)
  Problem: Education: Goal: Knowledge of risk factors and measures for prevention of condition will improve Outcome: Progressing   Problem: Respiratory: Goal: Will maintain a patent airway Outcome: Progressing Goal: Complications related to the disease process, condition or treatment will be avoided or minimized Outcome: Progressing   Problem: Education: Goal: Knowledge of General Education information will improve Description: Including pain rating scale, medication(s)/side effects and non-pharmacologic comfort measures Outcome: Progressing   Problem: Health Behavior/Discharge Planning: Goal: Ability to manage health-related needs will improve Outcome: Progressing   Problem: Clinical Measurements: Goal: Ability to maintain clinical measurements within normal limits will improve Outcome: Progressing Goal: Will remain free from infection Outcome: Progressing Goal: Diagnostic test results will improve Outcome: Progressing Goal: Respiratory complications will improve Outcome: Progressing Goal: Cardiovascular complication will be avoided Outcome: Progressing   Problem: Activity: Goal: Risk for activity intolerance will decrease Outcome: Progressing   Problem: Nutrition: Goal: Adequate nutrition will be maintained Outcome: Progressing   Problem: Coping: Goal: Level of anxiety will decrease Outcome: Progressing   Problem: Elimination: Goal: Will not experience complications related to bowel motility Outcome: Progressing Goal: Will not experience complications related to urinary retention Outcome: Progressing   Problem: Pain Managment: Goal: General experience of comfort will improve Outcome: Progressing   Problem: Safety: Goal: Ability to remain free from injury will improve Outcome: Progressing   Problem: Skin Integrity: Goal: Risk for impaired skin integrity will decrease Outcome: Progressing   

## 2019-08-07 NOTE — Procedures (Signed)
Intubation Procedure Note [name] [id number] 1951-03-21  Procedure: Intubation Indications: ETT clogged and needed larger tube for endobronchial blocker  Procedure Details Consent: Risks of procedure as well as the alternatives and risks of each were explained to the (patient/caregiver).  Consent for procedure obtained. Time Out: Verified patient identification, verified procedure, site/side was marked, verified correct patient position, special equipment/implants available, medications/allergies/relevent history reviewed, required imaging and test results available.  Performed  Drugs fentanyl 136mg IV push and infusion, versed 448mIV push and infusion, rocuronium 9025mV push DL x 1 with MAC 4 blade Grade 2 view: cords very edematous and inflammed 9.0 ET tube passed through cords under direct visualization Placement confirmed with bilateral breath sounds, positive EtCO2 change and smoke in tube   Evaluation Hemodynamic Status: BP stable throughout; O2 sats: stable throughout Patient's Current Condition: stable Complications: No apparent complications Patient did tolerate procedure well. Chest X-ray ordered to verify placement.  CXR: pending.   [name]/15/2020

## 2019-08-07 NOTE — Progress Notes (Signed)
Pt went into Afib RVR rate 160s-190s at 23:30. Ran EG7 to check lytes, notified MD of rhythm change and EG7 results.  Time: 2343  pH   7.184  pCO2   70.2  pO2  64.0  TCO  2 28  Acid-Base deficit  2.0  Bicarb   26.3  sO2%   84%    Na   146  K   4.8  iCa  1.03  Hg   8.8  Hct  26.0%  Given orders for Amio bolus and gtt as well as one-time paralytic dose to see if it will improve respiratory status at all. To draw ABG 1 hour after giving paralytic.  Currently maxed on all pressors, bicarb gtt, and fentanyl gtt (only 49mcg/hr). Current vent settings: FiO2 100%, PEEP 10, PC over PEEP 12, set rate 35. Pt has been breathing over vent ~37 rpm.

## 2019-08-07 NOTE — Plan of Care (Signed)
  Problem: Nutrition: Goal: Adequate nutrition will be maintained Outcome: Progressing   

## 2019-08-07 NOTE — Progress Notes (Signed)
RT NOTE:  ETT secured @ 24cm center on lip  Uniblocker located @ 56.5cm   RT discussed plan with RN for tonight.  RT will monitor closely.

## 2019-08-07 NOTE — Progress Notes (Signed)
Inpatient Diabetes Program Recommendations  AACE/ADA: New Consensus Statement on Inpatient Glycemic Control (2015)  Target Ranges:  Prepandial:   less than 140 mg/dL      Peak postprandial:   less than 180 mg/dL (1-2 hours)      Critically ill patients:  140 - 180 mg/dL   Lab Results  Component Value Date   GLUCAP 276 (H) 08/07/2019   HGBA1C 6.1 (H) 07/26/2019    Review of Glycemic Control Results for KENSON, GROH "[name]" (MRN [id number]) as of 08/07/2019 10:22  Ref. Range 08/06/2019 16:39 08/06/2019 21:14 08/06/2019 23:42 08/07/2019 04:55 08/07/2019 07:49  Glucose-Capillary Latest Ref Range: 70 - 99 mg/dL 183 (H) 256 (H) 297 (H) 314 (H) 276 (H)   Diabetes history: No hx DM noted Outpatient Diabetes medications: None Current orders for Inpatient glycemic control: Novolog sensitive correction q 4 hrs.  Inpatient Diabetes Program Recommendations:   -Add Novolog 3 units q 4 hrs for tube feed coverage (hold if tube feed held or stopped)  Thank you, Bethena Roys E. Pierra Skora, RN, MSN, CDE  Diabetes Coordinator Inpatient Glycemic Control Team Team Pager [phone] (8am-5pm) 08/07/2019 10:23 AM

## 2019-08-07 NOTE — Op Note (Signed)
[facility] PCCM Endobronchial Blocker Placement   Indication: massive air leak from R lung in setting of COVID 19 ARDS (near 8-10 L/min air leak noted on ventilator) Condition pre-procedure: critical, requiring multiple forms of life support  Procedure description: I explained the patient's critical condition and active deterioration to the patient's wife and she voiced understanding.  I advised that we could attempt a very high risk life saving procedure which including exchanging his ETT and placing an endobronchial blocker temporarily to allow the large air leak to heal.  She voiced understanding and gave verbal consent for the procedure over the phone.  I exchanged the ETT by removing the 7.5 clogged ETT and replacing it with a 9.0 ETT.  See separate note for this procedure.  I then performed a bronchoscopy and inspected the airway bilaterally finding minimal secretions, no airway lesions or masses.  We then placed a 9 French uniblocker in the right lower lobe under bronchoscopic guidance.  We initially inflated 2 cc in the cuff while it was occluding the orifice of the RLL.  This gave transient loss of air leak in the pleur-evac, but the leak recurred.  We then withdrew the endobronchial blocker into the bronchus intermedius and inflated a total of 5cc of saline in the occluding balloon.  The air leak disappeared at this point, the right upper lobe orifice was patent.  However the cuff migrated to the right mainstem and the patient experienced significant hypoxemia and transient bradycardia corrected by deflating the occluding balloon completely, administering IV epinephrine and sodium bicarbonate.  The patient's HR and blood pressure and oxygenation improved at this point.  Then under bronchoscopic guidance I replaced the endobronchial blocker in the orifice of the bronchus intermedius and replaced 2cc of saline in the occluding cuff.  This produced a large reduction (but not complete) in the air leak to about  10cc on the ventilator.  I then observed the occluding balloon in this position for several minutes and it did not migrate.  The Uniblocker was secured at 57cm.  Plan moving forward: monitor air leak through weekend, deflate cuff next week.  If resolution then plan to remove the endobronchial blocker next week.    Condition post procedure: critically ill, improved air leak  Orders written for RT and Nursing re endobronchial blocker: if sudden loss of tidal volume or hypoxemia deflate occluding balloon and notify MD on call.  If no improvement then remove uniblocker.   Roselie Awkward, MD [place] [facility] Pager: [phone] Cell: [phone] If no response, call [phone]

## 2019-08-07 NOTE — Progress Notes (Signed)
PHARMACY NOTE:  ANTIMICROBIAL RENAL DOSAGE ADJUSTMENT  Current antimicrobial regimen includes a mismatch between antimicrobial dosage and estimated renal function.  As per policy approved by the Pharmacy & Therapeutics and Medical Executive Committees, the antimicrobial dosage will be adjusted accordingly.  Current antimicrobial dosage:  Cefepime 2 gm IV Q 24 hours   Indication: Pneumonia / bacteremia   Renal Function:  Estimated Creatinine Clearance: 15.1 mL/min (A) (by C-G formula based on SCr of 5.44 mg/dL (H)). []      On intermittent HD, scheduled: [x]      On CRRT    Antimicrobial dosage has been changed to:  Cefepime 2 gm IV Q 12 hours   Additional comments:   Thank you for allowing pharmacy to be a part of this patient's care.  Albertina Parr, PharmD., BCPS Clinical Pharmacist

## 2019-08-07 NOTE — Progress Notes (Signed)
[facility] PCCM  Wife updated of events of the last 2 hours.  She voiced understanding.  Roselie Awkward, MD [place] [facility] Pager: [phone] Cell: [phone] If no response, call [phone]

## 2019-08-07 NOTE — Progress Notes (Signed)
Spoke with spouse Asahd Can and updated her on patient's status.

## 2019-08-07 NOTE — Progress Notes (Signed)
Assisted MD in ETT change for endobronchial blocker placement.  The 7.5 ETT was removed, and a 9.0 ETT was placed using direct laryngoscopy by MD.  Once ETT was in place, endobronchial blocker procedure was performed by MD using bronchoscope for guidance.  Blocker balloon was inflated with 5 cc.  Approximately 30 minutes later, patient started dropping his tidal volumes on the vent; increased PC 28 with no change.  MD used bronchoscope to check placement of endobronchial blocker; Advanced to proper placement and baloon re-inflated with 2 CC saline.  Patient tolerated well.

## 2019-08-07 NOTE — Progress Notes (Signed)
At 0350 attempted to wean pt to [facility] settings.  Pt unable to tolerate due to breath stacking. Pt placed back on PC setting.

## 2019-08-07 NOTE — Progress Notes (Signed)
Spoke to patient's wife, [name], on phone and updated her on his status and made her aware of CRRT initiation. Answered her questions and she was very appreciative of care.

## 2019-08-07 NOTE — Progress Notes (Signed)
RT NOTE:  ETT secured @ 24cm center on lip  Uniblocker located @ 56.5cm   RT will monitor closely.

## 2019-08-08 ENCOUNTER — Inpatient Hospital Stay (HOSPITAL_COMMUNITY): Payer: Medicare HMO

## 2019-08-08 DIAGNOSIS — Z9689 Presence of other specified functional implants: Secondary | ICD-10-CM | POA: Diagnosis not present

## 2019-08-08 DIAGNOSIS — R579 Shock, unspecified: Secondary | ICD-10-CM | POA: Diagnosis not present

## 2019-08-08 DIAGNOSIS — J9600 Acute respiratory failure, unspecified whether with hypoxia or hypercapnia: Secondary | ICD-10-CM | POA: Diagnosis not present

## 2019-08-08 DIAGNOSIS — A4901 Methicillin susceptible Staphylococcus aureus infection, unspecified site: Secondary | ICD-10-CM | POA: Diagnosis not present

## 2019-08-08 DIAGNOSIS — J1289 Other viral pneumonia: Secondary | ICD-10-CM | POA: Diagnosis not present

## 2019-08-08 DIAGNOSIS — D649 Anemia, unspecified: Secondary | ICD-10-CM | POA: Diagnosis not present

## 2019-08-08 DIAGNOSIS — E872 Acidosis: Secondary | ICD-10-CM | POA: Diagnosis not present

## 2019-08-08 DIAGNOSIS — I4891 Unspecified atrial fibrillation: Secondary | ICD-10-CM | POA: Diagnosis not present

## 2019-08-08 DIAGNOSIS — U071 COVID-19: Secondary | ICD-10-CM | POA: Diagnosis not present

## 2019-08-08 DIAGNOSIS — I959 Hypotension, unspecified: Secondary | ICD-10-CM | POA: Diagnosis not present

## 2019-08-08 DIAGNOSIS — N179 Acute kidney failure, unspecified: Secondary | ICD-10-CM | POA: Diagnosis not present

## 2019-08-08 DIAGNOSIS — K922 Gastrointestinal hemorrhage, unspecified: Secondary | ICD-10-CM | POA: Diagnosis not present

## 2019-08-08 DIAGNOSIS — J9601 Acute respiratory failure with hypoxia: Secondary | ICD-10-CM | POA: Diagnosis not present

## 2019-08-08 DIAGNOSIS — E8770 Fluid overload, unspecified: Secondary | ICD-10-CM | POA: Diagnosis not present

## 2019-08-08 DIAGNOSIS — N400 Enlarged prostate without lower urinary tract symptoms: Secondary | ICD-10-CM | POA: Diagnosis not present

## 2019-08-08 LAB — RENAL FUNCTION PANEL
Albumin: 2.8 g/dL — ABNORMAL LOW (ref 3.5–5.0)
Albumin: 2.6 g/dL — ABNORMAL LOW (ref 3.5–5.0)
Anion gap: 10 (ref 5–15)
Anion gap: 11 (ref 5–15)
BUN: 88 mg/dL — ABNORMAL HIGH (ref 8–23)
BUN: 108 mg/dL — ABNORMAL HIGH (ref 8–23)
CO2: 26 mmol/L (ref 22–32)
CO2: 23 mmol/L (ref 22–32)
Calcium: 7.5 mg/dL — ABNORMAL LOW (ref 8.9–10.3)
Calcium: 7.7 mg/dL — ABNORMAL LOW (ref 8.9–10.3)
Chloride: 105 mmol/L (ref 98–111)
Chloride: 108 mmol/L (ref 98–111)
Creatinine, Ser: 3.36 mg/dL — ABNORMAL HIGH (ref 0.61–1.24)
Creatinine, Ser: 3.28 mg/dL — ABNORMAL HIGH (ref 0.61–1.24)
GFR calc Af Amer: 21 mL/min — ABNORMAL LOW (ref >60)
GFR calc Af Amer: 22 mL/min — ABNORMAL LOW (ref >60)
GFR calc non Af Amer: 18 mL/min — ABNORMAL LOW (ref >60)
GFR calc non Af Amer: 19 mL/min — ABNORMAL LOW (ref >60)
Glucose, Bld: 197 mg/dL — ABNORMAL HIGH (ref 70–99)
Glucose, Bld: 209 mg/dL — ABNORMAL HIGH (ref 70–99)
Phosphorus: 3.1 mg/dL (ref 2.5–4.6)
Phosphorus: 3.5 mg/dL (ref 2.5–4.6)
Potassium: 4.7 mmol/L (ref 3.5–5.1)
Potassium: 4.7 mmol/L (ref 3.5–5.1)
Sodium: 141 mmol/L (ref 135–145)
Sodium: 142 mmol/L (ref 135–145)

## 2019-08-08 LAB — COMPREHENSIVE METABOLIC PANEL
ALT: 5 U/L (ref 0–44)
AST: 45 U/L — ABNORMAL HIGH (ref 15–41)
Albumin: 2.6 g/dL — ABNORMAL LOW (ref 3.5–5.0)
Alkaline Phosphatase: 93 U/L (ref 38–126)
Anion gap: 12 (ref 5–15)
BUN: 113 mg/dL — ABNORMAL HIGH (ref 8–23)
CO2: 25 mmol/L (ref 22–32)
Calcium: 7.7 mg/dL — ABNORMAL LOW (ref 8.9–10.3)
Chloride: 107 mmol/L (ref 98–111)
Creatinine, Ser: 3.13 mg/dL — ABNORMAL HIGH (ref 0.61–1.24)
GFR calc Af Amer: 23 mL/min — ABNORMAL LOW (ref >60)
GFR calc non Af Amer: 20 mL/min — ABNORMAL LOW (ref >60)
Glucose, Bld: 191 mg/dL — ABNORMAL HIGH (ref 70–99)
Potassium: 4.8 mmol/L (ref 3.5–5.1)
Sodium: 144 mmol/L (ref 135–145)
Total Bilirubin: 0.5 mg/dL (ref 0.3–1.2)
Total Protein: 5.7 g/dL — ABNORMAL LOW (ref 6.5–8.1)

## 2019-08-08 LAB — CBC WITH DIFFERENTIAL/PLATELET
Abs Immature Granulocytes: 0.06 10*3/uL (ref 0.00–0.07)
Basophils Absolute: 0 10*3/uL (ref 0.0–0.1)
Basophils Relative: 0 %
Eosinophils Absolute: 0 10*3/uL (ref 0.0–0.5)
Eosinophils Relative: 0 %
HCT: 22.8 % — ABNORMAL LOW (ref 39.0–52.0)
Hemoglobin: 7.2 g/dL — ABNORMAL LOW (ref 13.0–17.0)
Immature Granulocytes: 1 %
Lymphocytes Relative: 2 %
Lymphs Abs: 0.2 10*3/uL — ABNORMAL LOW (ref 0.7–4.0)
MCH: 30.8 pg (ref 26.0–34.0)
MCHC: 31.6 g/dL (ref 30.0–36.0)
MCV: 97.4 fL (ref 80.0–100.0)
Monocytes Absolute: 0.2 10*3/uL (ref 0.1–1.0)
Monocytes Relative: 2 %
Neutro Abs: 9.6 10*3/uL — ABNORMAL HIGH (ref 1.7–7.7)
Neutrophils Relative %: 95 %
Platelets: 187 10*3/uL (ref 150–400)
RBC: 2.34 MIL/uL — ABNORMAL LOW (ref 4.22–5.81)
RDW: 15.9 % — ABNORMAL HIGH (ref 11.5–15.5)
WBC: 10.2 10*3/uL (ref 4.0–10.5)
nRBC: 3.8 % — ABNORMAL HIGH (ref 0.0–0.2)

## 2019-08-08 LAB — POCT I-STAT 7, (LYTES, BLD GAS, ICA,H+H)
Acid-Base Excess: 1 mmol/L (ref 0.0–2.0)
Bicarbonate: 25.3 mmol/L (ref 20.0–28.0)
Bicarbonate: 25.7 mmol/L (ref 20.0–28.0)
Calcium, Ion: 1.08 mmol/L — ABNORMAL LOW (ref 1.15–1.40)
Calcium, Ion: 1.08 mmol/L — ABNORMAL LOW (ref 1.15–1.40)
HCT: 21 % — ABNORMAL LOW (ref 39.0–52.0)
HCT: 21 % — ABNORMAL LOW (ref 39.0–52.0)
Hemoglobin: 7.1 g/dL — ABNORMAL LOW (ref 13.0–17.0)
Hemoglobin: 7.1 g/dL — ABNORMAL LOW (ref 13.0–17.0)
O2 Saturation: 99 %
O2 Saturation: 100 %
Patient temperature: 36.2
Patient temperature: 36
Potassium: 4.7 mmol/L (ref 3.5–5.1)
Potassium: 4.8 mmol/L (ref 3.5–5.1)
Sodium: 142 mmol/L (ref 135–145)
Sodium: 142 mmol/L (ref 135–145)
TCO2: 27 mmol/L (ref 22–32)
TCO2: 27 mmol/L (ref 22–32)
pCO2 arterial: 40.5 mmHg (ref 32.0–48.0)
pCO2 arterial: 36.7 mmHg (ref 32.0–48.0)
pH, Arterial: 7.4 (ref 7.350–7.450)
pH, Arterial: 7.449 (ref 7.350–7.450)
pO2, Arterial: 114 mmHg — ABNORMAL HIGH (ref 83.0–108.0)
pO2, Arterial: 194 mmHg — ABNORMAL HIGH (ref 83.0–108.0)

## 2019-08-08 LAB — BASIC METABOLIC PANEL WITH GFR
Anion gap: 12 (ref 5–15)
BUN: 99 mg/dL — ABNORMAL HIGH (ref 8–23)
CO2: 22 mmol/L (ref 22–32)
Calcium: 7.6 mg/dL — ABNORMAL LOW (ref 8.9–10.3)
Chloride: 110 mmol/L (ref 98–111)
Creatinine, Ser: 2.86 mg/dL — ABNORMAL HIGH (ref 0.61–1.24)
GFR calc Af Amer: 25 mL/min — ABNORMAL LOW
GFR calc non Af Amer: 22 mL/min — ABNORMAL LOW
Glucose, Bld: 186 mg/dL — ABNORMAL HIGH (ref 70–99)
Potassium: 4.7 mmol/L (ref 3.5–5.1)
Sodium: 144 mmol/L (ref 135–145)

## 2019-08-08 LAB — COMPLEMENT, TOTAL: Compl, Total (CH50): 41 U/mL — ABNORMAL LOW (ref >41)

## 2019-08-08 LAB — D-DIMER, QUANTITATIVE: D-Dimer, Quant: 3.84 ug/mL-FEU — ABNORMAL HIGH (ref 0.00–0.50)

## 2019-08-08 LAB — GLUCOSE, CAPILLARY
Glucose-Capillary: 138 mg/dL — ABNORMAL HIGH (ref 70–99)
Glucose-Capillary: 180 mg/dL — ABNORMAL HIGH (ref 70–99)
Glucose-Capillary: 183 mg/dL — ABNORMAL HIGH (ref 70–99)
Glucose-Capillary: 187 mg/dL — ABNORMAL HIGH (ref 70–99)
Glucose-Capillary: 189 mg/dL — ABNORMAL HIGH (ref 70–99)
Glucose-Capillary: 214 mg/dL — ABNORMAL HIGH (ref 70–99)

## 2019-08-08 LAB — PREPARE RBC (CROSSMATCH)

## 2019-08-08 LAB — C-REACTIVE PROTEIN: CRP: 10.4 mg/dL — ABNORMAL HIGH (ref <1.0)

## 2019-08-08 LAB — MAGNESIUM: Magnesium: 2.9 mg/dL — ABNORMAL HIGH (ref 1.7–2.4)

## 2019-08-08 LAB — PHOSPHORUS: Phosphorus: 3.1 mg/dL (ref 2.5–4.6)

## 2019-08-08 MED ORDER — SODIUM CHLORIDE 0.9 % IV SOLN
50.0000 ug/h | INTRAVENOUS | Status: DC
  Administered 2019-08-08 – 2019-08-09 (×2): 50 ug/h via INTRAVENOUS
  Filled 2019-08-08 (×7): qty 1

## 2019-08-08 MED ORDER — SODIUM CHLORIDE 0.9 % IV SOLN
8.0000 mg/h | INTRAVENOUS | Status: DC
  Administered 2019-08-08 – 2019-08-10 (×6): 8 mg/h via INTRAVENOUS
  Filled 2019-08-08 (×9): qty 80

## 2019-08-08 MED ORDER — SODIUM CHLORIDE 0.9% IV SOLUTION
Freq: Once | INTRAVENOUS | Status: AC
  Administered 2019-08-08: 11:00:00 via INTRAVENOUS

## 2019-08-08 NOTE — Progress Notes (Signed)
Endobronchial blocker remains secured at 57.5 cm. R chest wall CT in place, minimal air leak present.  No output for 2000

## 2019-08-08 NOTE — Progress Notes (Signed)
[facility] PCCM  I tried to call his wife [name] on both numbers listed, had to leave a message.  Roselie Awkward, MD [place] [facility] Pager: [phone] Cell: [phone] If no response, call [phone]

## 2019-08-08 NOTE — Progress Notes (Signed)
[facility] PCCM  [name] called back and I gave her an update, questions answered  Roselie Awkward, MD [place] [facility] Pager: [phone] Cell: [phone] If no response, call [phone]

## 2019-08-08 NOTE — Progress Notes (Signed)
NAME:  [name], MRN:  [id number], DOB:  Jun 29, 1951, LOS: 31 ADMISSION DATE:  07/25/2019, CONSULTATION DATE:  10/3 REFERRING MD:  Bridgett Larsson, CHIEF COMPLAINT:  Dyspnea   Brief History   68 y/o male admitted on 10/3 from the [facility] ED with ARDS from COVID pneumonia leading to need for mechanical ventilation.    Past Medical History  COVID 19 Heartburn  Significant Hospital Events   10/3 admission 10/10 weaned 12 hours 10/11 dark stool 10/13 pneumothorax 10/15 endobronchial blocker placed  Consults:  PCCM  Procedures:  10/2 ETT>  10/2 R IJ CVL > 10/11 10/14 R chest tube >  10/14 R subclavian CVL >  10/14 L IJ HD cath >   Significant Diagnostic Tests:  10/2 CT angiogram chest > personally reviewed, extensive bilateral airspace disease predominantly posterior  Micro Data:  9/20 SARS COV 2 > POSITIVE 10/2 blood >  10/9 blood > MSSA 2/4 10/9 urine >  10/9 resp > MSSA, psuedomonas  10/13 blood >  10/14 blood >   Antimicrobials/COVID Rx  10/3 remdesivir > 10/6 10/3 actemra 10/3 decadron > 10/4 convalescent plasma, repeat 10/6  10/9 vanc > 10/10 10/9 cefepime > 10/10 10/10 Ancef >  10/13 Cefepime >  10/14 anidulofungin>   Interim history/subjective:   ET tube exchanged yesterday Endobronchial blocker placed yesterday Increased air leak this morning Endobronchial blocker migrated out of position overnight from 57 to 54 cm  Objective   Blood pressure 125/74, pulse 70, temperature (!) 96.8 F (36 C), temperature source Esophageal, resp. rate (!) 30, height 5\' 10"  (1.778 m), weight 90.1 kg, SpO2 100 %. CVP:  [13 mmHg] 13 mmHg  Vent Mode: PCV FiO2 (%):  [60 %-100 %] 100 % Set Rate:  [35 bmp] 35 bmp PEEP:  [10 cmH20] 10 cmH20 Plateau Pressure:  [22 cmH20-36 cmH20] 26 cmH20   Intake/Output Summary (Last 24 hours) at 08/08/2019 1014 Last data filed at 08/08/2019 0900 Gross per 24 hour  Intake 3519.97 ml  Output 2857 ml  Net 662.97 ml   Filed Weights   08/06/19 0500 08/07/19 0320 08/08/19 0500  Weight: 87.6 kg 90.1 kg 90.1 kg    Examination:  General:  In bed on vent HENT: NCAT ETT in place PULM: Improved rattling in R lung, still present, clear on left, vent supported breathing CV: RRR, no mgr GI: BS+, soft, nontender MSK: normal bulk and tone Neuro: sedated on vent   10/14 CXR images personally reviewed: R pigtail, chest tube in place  Resolved Hospital Problem list     Assessment & Plan:  Severe ARDS due to COVID 19 pneumonia: improving oxygenation and ventilator mechanics Severe air leak from R lung, losing approximately 50% of minute ventilation with rising hypercarbia, worsening with life threatening respiratory acidosis > 10/16 respiratory acidosis, air leak and hypoxemia improved but not completely resolved (approximately 1-2 L/min compared to 8-9 L/min prior to placement) Performed bronchoscopy today on rounds, see separate note to reposition endobronchial blocker Currently air leak is acceptable, 1-2 chambers bubbles on suction, 1 to 2 L/min on vent Will place chest tube on water seal and repeat CXR, goal is to minimize air crossing the broncho-pleural fistula to allow healing Continue full vent support per ARDS protocol: Continue mechanical ventilation per ARDS protocol Target TVol 6-8cc/kgIBW Target Plateau Pressure < 30cm H20 Target driving pressure less than 15 cm of water Target PaO2 55-65: titrate PEEP/FiO2 per protocol As long as PaO2 to FiO2 ratio is less than 1:150 position in prone  position for 16 hours a day Check CVP daily if CVL in place Target CVP less than 4, diurese as necessary Ventilator associated pneumonia prevention protocol 10/16 plan: decrease RR in hopes to reduce minute ventilation and air flow across bronchopleural fistula  Pneumothorax on R with large air leak Plan is to let it heal over weekend If no improvement in air leak next week consider endobronchial valve placement  AKI:  worsening, oliguric Continue CVVHD  Anticoagulation/DVT prevention in setting of thrombophilia from COVID 19 Slow GI bleed?> melena on round 10/16  Increase PPI to bid Serial CBC Hold anticoagulation SCD's for DVT prophylaxis May need GI consultation if persists  MSSA bacteremia: Septic shock 10/13> pseudomonas pneumonia? Other systemic infection? Fungal? Stop vancomycin and anidulofungin today Continue cefepime Continue to wean off Levophed and vasopressin  Need for sedation/mechanical ventilation: severe agitation when weaning sedation RASS target -1 to -2 Oxycodone, seroquel to continue Continue fentanyl, versed infusion   Best practice:  Diet: tube feeding Pain/Anxiety/Delirium protocol (if indicated): as above VAP protocol (if indicated): yes DVT prophylaxis: as above GI prophylaxis: famotidine Glucose control: SSI Mobility: bed rest Code Status: full Family Communication: will update his wife [name] again today Disposition: remain in ICU  Labs   CBC: Recent Labs  Lab 08/05/19 0550  08/06/19 0610  08/06/19 1750  08/07/19 0500 08/07/19 0552 08/07/19 1805 08/08/19 0314 08/08/19 0600 08/08/19 1000  WBC 31.9*  --  14.8*  --  15.9*  --  12.1*  --   --   --  10.2  --   NEUTROABS  --   --   --   --   --   --  11.3*  --   --   --  9.6*  --   HGB 11.9*   < > 9.8*   < > 9.5*   < > 8.1* 8.5* 7.5* 7.1* 7.2* 7.1*  HCT 38.7*   < > 32.9*   < > 31.3*   < > 27.2* 25.0* 22.0* 21.0* 22.8* 21.0*  MCV 99.7  --  103.1*  --  103.3*  --  103.4*  --   --   --  97.4  --   PLT 395  --  259  --  245  --  204  --   --   --  187  --    < > = values in this interval not displayed.    Basic Metabolic Panel: Recent Labs  Lab 08/02/19 0457  08/05/19 0550  08/06/19 0610  08/07/19 0500  08/07/19 1605 08/07/19 1805 08/08/19 0314 08/08/19 0600 08/08/19 1000  NA 145   < > 149*   < > 149*   < > 146*   < > 145 145 142 141 142  K 4.3   < > 4.3   < > 4.2   < > 5.1   < > 5.2* 4.6 4.7 4.7  4.8  CL 99   < > 103  --  108  --  103  --  106  --   --  105  --   CO2 35*   < > 29  --  28  --  27  --  25  --   --  26  --   GLUCOSE 157*   < > 186*  --  229*  --  362*  --  220*  --   --  197*  --   BUN 74*   < > 129*  --  164*  --  185*  --  162*  --   --  88*  --   CREATININE 1.54*   < > 2.78*  --  4.25*  --  5.44*  --  4.81*  --   --  3.36*  --   CALCIUM 9.4   < > 8.8*  --  7.6*  --  7.1*  --  7.2*  --   --  7.5*  --   MG 3.1*  --   --   --   --   --  4.0*  --   --   --   --   --   --   PHOS 3.8  --   --   --   --   --  11.2*  --  8.1*  --   --  3.1  --    < > = values in this interval not displayed.   GFR: Estimated Creatinine Clearance: 24.4 mL/min (A) (by C-G formula based on SCr of 3.36 mg/dL (H)). Recent Labs  Lab 08/06/19 0610 08/06/19 1750 08/07/19 0500 08/08/19 0600  WBC 14.8* 15.9* 12.1* 10.2    Liver Function Tests: Recent Labs  Lab 08/03/19 1035 08/04/19 0535 08/05/19 0550 08/06/19 0610 08/07/19 0500 08/07/19 1605 08/08/19 0600  AST 21 27 31 22 28   --   --   ALT 28 22 19 9 6   --   --   ALKPHOS 94 121 104 45 65  --   --   BILITOT 0.7 0.5 0.6 1.0 0.3  --   --   PROT 6.5 6.8 6.8 5.3* 5.0*  --   --   ALBUMIN 3.5 3.5 3.5 3.2* 2.5* 2.7* 2.8*   No results for input(s): LIPASE, AMYLASE in the last 168 hours. No results for input(s): AMMONIA in the last 168 hours.  ABG    Component Value Date/Time   PHART 7.449 08/08/2019 1000   PCO2ART 36.7 08/08/2019 1000   PO2ART 194.0 (H) 08/08/2019 1000   HCO3 25.7 08/08/2019 1000   TCO2 27 08/08/2019 1000   ACIDBASEDEF 2.0 08/07/2019 0552   O2SAT 100.0 08/08/2019 1000     Coagulation Profile: No results for input(s): INR, PROTIME in the last 168 hours.  Cardiac Enzymes: No results for input(s): CKTOTAL, CKMB, CKMBINDEX, TROPONINI in the last 168 hours.  HbA1C: Hgb A1c MFr Bld  Date/Time Value Ref Range Status  07/26/2019 05:00 AM 6.1 (H) 4.8 - 5.6 % Final    Comment:    (NOTE) Pre diabetes:           5.7%-6.4% Diabetes:              >6.4% Glycemic control for   <7.0% adults with diabetes     CBG: Recent Labs  Lab 08/07/19 1604 08/07/19 1949 08/07/19 2310 08/08/19 0309 08/08/19 0830  GLUCAP 209* 202* 188* 183* 138*     Critical care time: 38 minutes     Roselie Awkward, MD [place] [facility] Pager: [phone] Cell: [phone] If no response, call [phone]

## 2019-08-08 NOTE — Progress Notes (Signed)
[facility] PCCM  Endobronchial blocker secured at 57.5 cm, 2cc saline in balloon CXR on water seal: no pneumothorax  Will keep Chest Tube on water seal to hopefully reduce air travel across bronchopleural fistula and promote healing.  If no air leak on Saturday would deflate the balloon on endobronchial blocker.  Roselie Awkward, MD [place] [facility] Pager: [phone] Cell: [phone] If no response, call [phone]

## 2019-08-08 NOTE — Consult Note (Signed)
Received a call from Dr. Sherral Hammers from [facility] for a patient with ongoing melena and drop in hemoglobin. Upon discussing with Dr. Sherral Hammers and chart review, this is a 68 year old male admitted on 07/26/2019 with Covid pneumonia requiring mechanical ventilation, CAT scan showing extensive bilateral airspace disease, severe ARDS,developed a pneumothorax on 08/05/2019.  Patient noted to have respiratory acidosis, air leak and hypoxemia, status post bronchoscopy, chest tube in place, bronchopleural fistula and on full vent support for ARDS.  Patient also has acute kidney injury, worsening and oliguric and on CVVHD. Patient has MSSA bacteremia, septic shock, trying to wean off Levophed and vasopressin.  Patient noted to have black stools from 08/03/2019.   He was on PPI twice daily which has now been changed to Protonix 8mg /h IV, Heparin is on hold for the last 48 hours. Patient was admitted with a hemoglobin of 13.2, hemoglobin was 10.2 on 08/06/2019. Hemoglobin has been 7.5/7.1/7.2/7.1 since 08/07/2019. Patient has been given 2 units of FFP and is receiving 1 unit of PRBC currently.  As per prior documentation his colonoscopy was in 2009, EGD was in 2016, has history of alcohol use, 2 standard drinks per week, no history of IV drug use. No prior documentation of liver disease, no evidence of thrombocytopenia.  Discussed with Dr. Sherral Hammers that if patient requires an EGD, will need to be transferred to [facility]. I spoke with Dr. Michelene Heady and verified the same, that if patients need endoscopic intervention they will need to be transferred to [facility]. Transfer from [facility] not possible due to patient having an endobronchial leak and requiring mechanical ventilation. Recommend starting IV octreotide as well as source of melena is not known. Until patient is considered stable for transfer, recommend transfusing PRBC to keep hemoglobin above 7. Recommend continuing to hold  anticoagulation until melena resolves and hemoglobin stabilizes. We will be following the patient via chart review and discussion with the hospitalist, until patient is deemed stable enough for transfer for endoscopy. Patient is currently intubated will not be able to provide any history.  Ronnette Juniper, MD

## 2019-08-08 NOTE — Progress Notes (Signed)
Upon vent check endobronchial blocker was measuring at 54 and should be at 56. RN notified and will inform Dr. Lake Bells.

## 2019-08-08 NOTE — Progress Notes (Signed)
[facility] ROUNDING NOTE   Subjective:   This is a 68 year old gentleman who was admitted with Covid pneumonia July 13, 2019 completed course of remdesivir convalescent plasma Decadron treated with vancomycin and cefepime for possible liver 08/01/2019 until 08/02/2019.  On 08/02/2019 patient developed an ileus.  His creatinine on 07/25/2019 was 1.5 which appears to be his baseline.  On 08/02/2019 started to increase.  On 08/02/2019 was found to have MSSA bacteremia and was started on Ancef.  He was thought to have significant volume overload and is being treated with IV diuresis.  His blood pressure has been marginal with drops in blood pressure to 70 mmHg.  His renal ultrasound which showed no hydronephrosis on 08/07/2019 and had administration of intravenous contrast on 07/25/2019.  Blood pressure 112/82 pulse 106 temperature 96.8 O2 sats 100% FiO2 100  Oliguric renal failure.  Continues to be kept even on CRRT with no fluid off  Sodium 142 potassium 4.8 chloride 105 CO2 26 BUN 88 creatinine 3.36 glucose 197 calcium 7.5 albumin 2.8 phosphorus 3.1 WBC 10.2 hemoglobin 7.1 platelets 187  Hydrocortisone 50 mg every 6 hours, insulin sliding scale, Protonix 40 mg every 12 hours, Seroquel 25 mg twice daily, Flomax 0.4 mg daily  Objective:  Vital signs in last 24 hours:  Temp:  [93.7 F (34.3 C)-99.3 F (37.4 C)] 96.8 F (36 C) (10/16 0900) Pulse Rate:  [39-144] 70 (10/16 0900) Resp:  [0-37] 30 (10/16 0900) BP: (86-148)/(47-88) 125/74 (10/16 0900) SpO2:  [90 %-100 %] 100 % (10/16 0900) Arterial Line BP: (40-171)/(33-65) 123/65 (10/16 0800) FiO2 (%):  [60 %-100 %] 100 % (10/16 0300) Weight:  [90.1 kg] 90.1 kg (10/16 0500)  Weight change: 0 kg Filed Weights   08/06/19 0500 08/07/19 0320 08/08/19 0500  Weight: 87.6 kg 90.1 kg 90.1 kg    Intake/Output: I/O last 3 completed shifts: In: 8502.7 [I.V.:5177.1; NG/GT:2905.5; IV Piggyback:420.1] Out: 1517 [Urine:550;  OHYWV:3710; Stool:151; Chest Tube:950]   Intake/Output this shift:  Total I/O In: 184.9 [I.V.:104.9; NG/GT:80] Out: 109 [Other:109]  General intubated and sedated  CVS regular rate and rhythm Respiratory mechanically supported breath sounds Abdominal system soft nontender Extremity no edema   Basic Metabolic Panel: Recent Labs  Lab 08/02/19 0457  08/05/19 0550  08/06/19 0610  08/07/19 0500  08/07/19 1605 08/07/19 1805 08/08/19 0314 08/08/19 0600 08/08/19 1000  NA 145   < > 149*   < > 149*   < > 146*   < > 145 145 142 141 142  K 4.3   < > 4.3   < > 4.2   < > 5.1   < > 5.2* 4.6 4.7 4.7 4.8  CL 99   < > 103  --  108  --  103  --  106  --   --  105  --   CO2 35*   < > 29  --  28  --  27  --  25  --   --  26  --   GLUCOSE 157*   < > 186*  --  229*  --  362*  --  220*  --   --  197*  --   BUN 74*   < > 129*  --  164*  --  185*  --  162*  --   --  88*  --   CREATININE 1.54*   < > 2.78*  --  4.25*  --  5.44*  --  4.81*  --   --  3.36*  --   CALCIUM 9.4   < > 8.8*  --  7.6*  --  7.1*  --  7.2*  --   --  7.5*  --   MG 3.1*  --   --   --   --   --  4.0*  --   --   --   --   --   --   PHOS 3.8  --   --   --   --   --  11.2*  --  8.1*  --   --  3.1  --    < > = values in this interval not displayed.    Liver Function Tests: Recent Labs  Lab 08/03/19 1035 08/04/19 0535 08/05/19 0550 08/06/19 0610 08/07/19 0500 08/07/19 1605 08/08/19 0600  AST 21 27 31 22 28   --   --   ALT 28 22 19 9 6   --   --   ALKPHOS 94 121 104 45 65  --   --   BILITOT 0.7 0.5 0.6 1.0 0.3  --   --   PROT 6.5 6.8 6.8 5.3* 5.0*  --   --   ALBUMIN 3.5 3.5 3.5 3.2* 2.5* 2.7* 2.8*   No results for input(s): LIPASE, AMYLASE in the last 168 hours. No results for input(s): AMMONIA in the last 168 hours.  CBC: Recent Labs  Lab 08/05/19 0550  08/06/19 0610  08/06/19 1750  08/07/19 0500 08/07/19 0552 08/07/19 1805 08/08/19 0314 08/08/19 0600 08/08/19 1000  WBC 31.9*  --  14.8*  --  15.9*  --  12.1*   --   --   --  10.2  --   NEUTROABS  --   --   --   --   --   --  11.3*  --   --   --  9.6*  --   HGB 11.9*   < > 9.8*   < > 9.5*   < > 8.1* 8.5* 7.5* 7.1* 7.2* 7.1*  HCT 38.7*   < > 32.9*   < > 31.3*   < > 27.2* 25.0* 22.0* 21.0* 22.8* 21.0*  MCV 99.7  --  103.1*  --  103.3*  --  103.4*  --   --   --  97.4  --   PLT 395  --  259  --  245  --  204  --   --   --  187  --    < > = values in this interval not displayed.    Cardiac Enzymes: No results for input(s): CKTOTAL, CKMB, CKMBINDEX, TROPONINI in the last 168 hours.  BNP: Invalid input(s): POCBNP  CBG: Recent Labs  Lab 08/07/19 1604 08/07/19 1949 08/07/19 2310 08/08/19 0309 08/08/19 0830  GLUCAP 209* 202* 188* 183* 138*    Microbiology: Results for orders placed or performed during the hospital encounter of 07/25/19  Blood Culture (routine x 2)     Status: None   Collection Time: 07/25/19  4:56 PM   Specimen: Left Antecubital; Blood  Result Value Ref Range Status   Specimen Description LEFT ANTECUBITAL  Final   Special Requests   Final    BOTTLES DRAWN AEROBIC AND ANAEROBIC Blood Culture results may not be optimal due to an excessive volume of blood received in culture bottles   Culture   Final    NO GROWTH 5 DAYS Performed at [facility], [address], [place], [place] [address]  Report Status 07/30/2019 FINAL  Final  Blood Culture (routine x 2)     Status: None   Collection Time: 07/25/19  5:01 PM   Specimen: BLOOD RIGHT HAND  Result Value Ref Range Status   Specimen Description BLOOD RIGHT HAND  Final   Special Requests   Final    BOTTLES DRAWN AEROBIC AND ANAEROBIC Blood Culture adequate volume   Culture   Final    NO GROWTH 5 DAYS Performed at [facility], [address], [place], [place] [address]    Report Status 07/30/2019 FINAL  Final  MRSA PCR Screening     Status: None   Collection Time: 07/26/19  2:09 AM   Specimen: Nasal Mucosa; Nasopharyngeal  Result Value Ref Range Status   MRSA by PCR  NEGATIVE NEGATIVE Final    Comment:        The GeneXpert MRSA Assay (FDA approved for NASAL specimens only), is one component of a comprehensive MRSA colonization surveillance program. It is not intended to diagnose MRSA infection nor to guide or monitor treatment for MRSA infections. Performed at [facility] [place] [address] [place], [place] [address]   Culture, blood (Routine X 2) w Reflex to ID Panel     Status: Abnormal   Collection Time: 08/01/19 12:15 PM   Specimen: BLOOD  Result Value Ref Range Status   Specimen Description   Final    BLOOD RIGHT ARM Performed at [facility] [address], [place], [place] [address]    Special Requests   Final    BOTTLES DRAWN AEROBIC AND ANAEROBIC Blood Culture adequate volume Performed at [facility] [address], [place], [place] [address]    Culture  Setup Time   Final    GRAM POSITIVE COCCI IN CLUSTERS IN BOTH AEROBIC AND ANAEROBIC BOTTLES CRITICAL RESULT CALLED TO, READ BACK BY AND VERIFIED WITH: C. SHADE PHARMD, AT [phone] 08/02/19 BY Rush Landmark Performed at [facility], [place] [address], [place], [place] [address]    Culture STAPHYLOCOCCUS AUREUS (A)  Final   Report Status 08/04/2019 FINAL  Final   Organism ID, Bacteria STAPHYLOCOCCUS AUREUS  Final      Susceptibility   Staphylococcus aureus - MIC*    CIPROFLOXACIN <=0.5 SENSITIVE Sensitive     ERYTHROMYCIN <=0.25 SENSITIVE Sensitive     GENTAMICIN <=0.5 SENSITIVE Sensitive     OXACILLIN 0.5 SENSITIVE Sensitive     TETRACYCLINE <=1 SENSITIVE Sensitive     VANCOMYCIN 1 SENSITIVE Sensitive     TRIMETH/SULFA <=10 SENSITIVE Sensitive     CLINDAMYCIN <=0.25 SENSITIVE Sensitive     RIFAMPIN <=0.5 SENSITIVE Sensitive     Inducible Clindamycin NEGATIVE Sensitive     * STAPHYLOCOCCUS AUREUS  Blood Culture ID Panel (Reflexed)     Status: Abnormal   Collection Time: 08/01/19 12:15 PM  Result Value Ref Range Status    Enterococcus species NOT DETECTED NOT DETECTED Final   Listeria monocytogenes NOT DETECTED NOT DETECTED Final   Staphylococcus species DETECTED (A) NOT DETECTED Final    Comment: CRITICAL RESULT CALLED TO, READ BACK BY AND VERIFIED WITH: C. SHADE PHARMD, AT [phone] 08/02/19 BY D. VANHOOK    Staphylococcus aureus (BCID) DETECTED (A) NOT DETECTED Final    Comment: Methicillin (oxacillin) susceptible Staphylococcus aureus (MSSA). Preferred therapy is anti staphylococcal beta lactam antibiotic (Cefazolin or Nafcillin), unless clinically contraindicated. CRITICAL RESULT CALLED TO, READ BACK BY AND VERIFIED WITH: C. [place] [employer], AT [phone] 08/02/19  BY D. VANHOOK    Methicillin resistance NOT DETECTED NOT DETECTED Final   Streptococcus species NOT DETECTED NOT DETECTED Final   Streptococcus agalactiae NOT DETECTED NOT DETECTED Final   Streptococcus pneumoniae NOT DETECTED NOT DETECTED Final   Streptococcus pyogenes NOT DETECTED NOT DETECTED Final   Acinetobacter baumannii NOT DETECTED NOT DETECTED Final   Enterobacteriaceae species NOT DETECTED NOT DETECTED Final   Enterobacter cloacae complex NOT DETECTED NOT DETECTED Final   Escherichia coli NOT DETECTED NOT DETECTED Final   Klebsiella oxytoca NOT DETECTED NOT DETECTED Final   Klebsiella pneumoniae NOT DETECTED NOT DETECTED Final   Proteus species NOT DETECTED NOT DETECTED Final   Serratia marcescens NOT DETECTED NOT DETECTED Final   Haemophilus influenzae NOT DETECTED NOT DETECTED Final   Neisseria meningitidis NOT DETECTED NOT DETECTED Final   Pseudomonas aeruginosa NOT DETECTED NOT DETECTED Final   Candida albicans NOT DETECTED NOT DETECTED Final   Candida glabrata NOT DETECTED NOT DETECTED Final   Candida krusei NOT DETECTED NOT DETECTED Final   Candida parapsilosis NOT DETECTED NOT DETECTED Final   Candida tropicalis NOT DETECTED NOT DETECTED Final    Comment: Performed at [facility], [place] [address], [place], [place] [address]   Culture, blood (Routine X 2) w Reflex to ID Panel     Status: Abnormal   Collection Time: 08/01/19 12:21 PM   Specimen: BLOOD  Result Value Ref Range Status   Specimen Description   Final    BLOOD RIGHT HAND Performed at [facility] [address] [place], [place] [address]    Special Requests   Final    BOTTLES DRAWN AEROBIC AND ANAEROBIC Blood Culture adequate volume Performed at [facility] [address], [place], [place] [address]    Culture  Setup Time   Final    GRAM POSITIVE COCCI IN CLUSTERS IN BOTH AEROBIC AND ANAEROBIC BOTTLES CRITICAL RESULT CALLED TO, READ BACK BY AND VERIFIED WITH: C. SHADE PHARMD, AT [phone] 08/02/19 BY D. VANHOOK    Culture (A)  Final    STAPHYLOCOCCUS AUREUS SUSCEPTIBILITIES PERFORMED ON PREVIOUS CULTURE WITHIN THE LAST 5 DAYS. Performed at [facility], [place] [address], [place], [place] [address]    Report Status 08/04/2019 FINAL  Final  Culture, respiratory     Status: None   Collection Time: 08/01/19  3:30 PM   Specimen: Tracheal Aspirate  Result Value Ref Range Status   Specimen Description   Final    TRACHEAL ASPIRATE Performed at [facility] [address], [place], [place] [address]    Special Requests   Final    NONE Performed at [facility], [place] [address], [place], [place] [address]    Gram Stain   Final    RARE WBC PRESENT, PREDOMINANTLY PMN ABUNDANT GRAM POSITIVE COCCI IN CLUSTERS Performed at [facility], [place] [address] [place], [place] [address]    Culture   Final    MODERATE STAPHYLOCOCCUS AUREUS RARE PSEUDOMONAS AERUGINOSA    Report Status 08/06/2019 FINAL  Final   Organism ID, Bacteria STAPHYLOCOCCUS AUREUS  Final   Organism ID, Bacteria PSEUDOMONAS AERUGINOSA  Final      Susceptibility   Pseudomonas aeruginosa - MIC*    CEFTAZIDIME 4 SENSITIVE Sensitive     CIPROFLOXACIN 0.5 SENSITIVE Sensitive     GENTAMICIN <=1  SENSITIVE Sensitive     IMIPENEM 1 SENSITIVE Sensitive     PIP/TAZO 16 SENSITIVE Sensitive  CEFEPIME 4 SENSITIVE Sensitive     * RARE PSEUDOMONAS AERUGINOSA   Staphylococcus aureus - MIC*    CIPROFLOXACIN <=0.5 SENSITIVE Sensitive     ERYTHROMYCIN <=0.25 SENSITIVE Sensitive     GENTAMICIN <=0.5 SENSITIVE Sensitive     OXACILLIN 0.5 SENSITIVE Sensitive     TETRACYCLINE <=1 SENSITIVE Sensitive     VANCOMYCIN <=0.5 SENSITIVE Sensitive     TRIMETH/SULFA <=10 SENSITIVE Sensitive     CLINDAMYCIN <=0.25 SENSITIVE Sensitive     RIFAMPIN <=0.5 SENSITIVE Sensitive     Inducible Clindamycin NEGATIVE Sensitive     * MODERATE STAPHYLOCOCCUS AUREUS  Culture, Urine     Status: Abnormal   Collection Time: 08/01/19  5:45 PM   Specimen: Urine, Catheterized  Result Value Ref Range Status   Specimen Description   Final    URINE, CATHETERIZED Performed at [facility] [address] [place], [place] [address]    Special Requests   Final    NONE Performed at [facility], [place] [address], [place], [place] [address]    Culture (A)  Final    <10,000 COLONIES/mL INSIGNIFICANT GROWTH Performed at [facility] [address] [place], [place] [address]    Report Status 08/03/2019 FINAL  Final  MRSA PCR Screening     Status: None   Collection Time: 08/02/19 10:02 AM   Specimen: Nasal Mucosa; Nasopharyngeal  Result Value Ref Range Status   MRSA by PCR NEGATIVE NEGATIVE Final    Comment:        The GeneXpert MRSA Assay (FDA approved for NASAL specimens only), is one component of a comprehensive MRSA colonization surveillance program. It is not intended to diagnose MRSA infection nor to guide or monitor treatment for MRSA infections. Performed at [facility], [place] [address], [place], [place] [address]   Culture, blood (routine x 2)     Status: None (Preliminary result)   Collection Time: 08/05/19  5:00 AM   Specimen: BLOOD  RIGHT HAND  Result Value Ref Range Status   Specimen Description   Final    BLOOD RIGHT HAND Performed at [facility] [address], [place], [place] [address]    Special Requests   Final    BOTTLES DRAWN AEROBIC ONLY Blood Culture adequate volume Performed at [facility] [address] [place], [place] [address]    Culture   Final    NO GROWTH 3 DAYS Performed at [facility], [place] [address], [place], [place] [address]    Report Status PENDING  Incomplete  Culture, blood (routine x 2)     Status: None (Preliminary result)   Collection Time: 08/05/19  5:05 AM   Specimen: BLOOD LEFT HAND  Result Value Ref Range Status   Specimen Description   Final    BLOOD LEFT HAND Performed at [facility] [address] [place], [place] [address]    Special Requests   Final    BOTTLES DRAWN AEROBIC ONLY Blood Culture adequate volume Performed at [facility] [address], [place], [place] [address]    Culture   Final    NO GROWTH 3 DAYS Performed at [facility], [place] [address] [place], [place] [address]    Report Status PENDING  Incomplete    Coagulation Studies: No results for input(s): LABPROT, INR in the last 72 hours.  Urinalysis: No results for input(s): COLORURINE, LABSPEC, [place], [place], [place],  BILIRUBINUR, KETONESUR, PROTEINUR, UROBILINOGEN, NITRITE, LEUKOCYTESUR in the last 72 hours.  Invalid input(s): APPERANCEUR    Imaging: US Renal  Result Date: 08/07/2019 CLINICAL DATA:  Acute renal failure. EXAM: RENAL / URINARY TRACT ULTRASOUND COMPLETE COMPARISON:  None. FINDINGS: Right Kidney: Renal measurements: 10.0 x 5.9 x 4.8 cm = volume: 146 mL . Echogenicity within normal limits. No mass or hydronephrosis visualized. Left Kidney: Renal measurements: 11.2 x 6.8 x 5.3 cm = volume: 215 mL. Echogenicity within normal limits. No mass or hydronephrosis visualized. Bladder: Appears  normal for degree of bladder distention. Other: None. IMPRESSION: Normal renal ultrasound. Electronically Signed   By: Marijo Conception M.D.   On: 08/07/2019 09:47   Dg Chest Port 1 View  Result Date: 08/07/2019 CLINICAL DATA:  Evaluate endotracheal tube position. COVID-19 positive. EXAM: PORTABLE CHEST 1 VIEW COMPARISON:  08/06/2019 FINDINGS: Endotracheal tube terminates 4.9 cm above carina. Nasogastric tube extends beyond the inferior aspect of the film. A right-sided chest tube remains in place. The right-sided pleural drain has been removed. Left internal jugular line tip at mid SVC. Right subclavian line tip at high right atrium, similar. Mild cardiomegaly. Increased fluid within the right minor fissure. The previously described right apical pneumothorax is either improved or resolved. Possible trace pleural air remaining. Increased right base airspace disease. Relatively similar patchy right upper and left mid to lower lung pulmonary opacities. IMPRESSION: Appropriate position of endotracheal tube. Single right chest tube remaining in place, with improved to resolved right apical pneumothorax. Increased right pleural fluid with worsened right base airspace disease. Otherwise similar multifocal pneumonia. Electronically Signed   By: Abigail Miyamoto M.D.   On: 08/07/2019 20:18   Dg Chest Port 1 View  Result Date: 08/06/2019 CLINICAL DATA:  Chest tube, follow-up pneumothorax EXAM: PORTABLE CHEST 1 VIEW COMPARISON:  Chest radiograph from earlier today. FINDINGS: Endotracheal tube tip is 4.2 cm above the carina. Enteric tube enters stomach with the tip not seen on this image. Left internal jugular central venous catheter terminates in the middle third of the SVC. Right subclavian central venous catheter terminates over the right atrium. Stable right pigtail chest tube with tip in the peripheral lower right pleural space. Separate right basilar chest tube. Stable cardiomediastinal silhouette with normal heart  size. Small residual right apical pneumothorax, significantly decreased. No left pneumothorax. Midline mediastinum. No pleural effusion. Extensive patchy opacities throughout both lungs appear unchanged. IMPRESSION: 1. Small residual right apical pneumothorax, significantly decreased. 2. Stable extensive patchy opacities throughout both lungs. 3. Well-positioned support structures as described. Electronically Signed   By: Ilona Sorrel M.D.   On: 08/06/2019 15:25   Dg Chest Port 1 View  Addendum Date: 08/06/2019   ADDENDUM REPORT: 08/06/2019 13:46 ADDENDUM: These results were called by telephone at the time of interpretation on 08/06/2019 at 1:43 pm to provider Dr. Roselie Awkward, who verbally acknowledged these results. Electronically Signed   By: Aletta Edouard M.D.   On: 08/06/2019 13:46   Result Date: 08/06/2019 CLINICAL DATA:  Replacement of non tunneled dialysis catheter. Respiratory failure secondary to COVID-19 pneumonia. EXAM: PORTABLE CHEST 1 VIEW COMPARISON:  Film earlier today at 1119 hours FINDINGS: Replaced left jugular non tunneled dialysis catheter now projects inferiorly with the tip in the SVC. Other support apparatus stable with the endotracheal tube tip approximately 5 cm above the carina. Right lateral pneumothorax is slightly larger with stable positioning of a lateral pigtail thoracostomy tube. Bilateral severe pneumonia appears stable since the prior chest x-ray. Lucency abutting  the right hemidiaphragm may relate to a component subpulmonic air. However, free intraperitoneal air under the right hemidiaphragm is not entirely excluded by semi-erect chest x-ray. IMPRESSION: 1. Replaced left jugular non tunneled dialysis catheter tip is in the SVC. 2. Slightly larger right lateral pneumothorax. 3. Cannot exclude free air under the right hemidiaphragm. However, the appearance may relate to relative lucency of the lung at the right lung base and component of basilar pneumothorax as well.  Electronically Signed: By: Aletta Edouard M.D. On: 08/06/2019 13:41   Dg Chest Port 1 View  Result Date: 08/06/2019 CLINICAL DATA:  COVID.  Insertion of dialysis catheter EXAM: PORTABLE CHEST 1 VIEW COMPARISON:  None. FINDINGS: Left internal jugular dialysis catheter courses across the midline and likely passes into the right innominate vein. Right central line tip is at the cavoatrial junction. Endotracheal tube and NG tube are unchanged. Cardiomegaly. Patchy bilateral airspace opacities again noted, unchanged. Right chest tube remains in place with right basilar pneumothorax, stable. IMPRESSION: Interval placement of left internal jugular Vas-Cath. The tip likely passes into the right innominate vein. No left pneumothorax. Right chest tube remains in place with small right basilar pneumothorax, stable. Stable patchy bilateral airspace opacities. Electronically Signed   By: Rolm Baptise M.D.   On: 08/06/2019 11:56   Dg Abd Decub  Result Date: 08/06/2019 CLINICAL DATA:  Rule out pneumoperitoneum EXAM: ABDOMEN - 1 VIEW DECUBITUS COMPARISON:  08/02/2019 abdominal radiographs FINDINGS: No evidence of pneumatosis or pneumoperitoneum. No disproportionately dilated small bowel loops. Moderate gas in the nondependent colon. Enteric tube tip is seen in the proximal stomach. IMPRESSION: 1. No evidence of pneumoperitoneum. 2. Moderate gas in the nondependent colon. Nonobstructive bowel gas pattern. 3. Enteric tube tip in the proximal stomach. Electronically Signed   By: Ilona Sorrel M.D.   On: 08/06/2019 17:16     Medications:   .  prismasol BGK 4/2.5 500 mL/hr at 08/07/19 2231  .  prismasol BGK 4/2.5 500 mL/hr at 08/07/19 2231  . sodium chloride Stopped (08/01/19 0957)  . sodium chloride 10 mL/hr at 08/08/19 0900  . amiodarone Stopped (08/07/19 1715)  . anidulafungin Stopped (08/07/19 1014)  . ceFEPime (MAXIPIME) IV 2 g (08/08/19 1039)  . epinephrine    . feeding supplement (PIVOT 1.5 CAL) 1,000 mL  (08/07/19 1758)  . fentaNYL infusion INTRAVENOUS 50 mcg/hr (08/08/19 0900)  . midazolam 1 mg/hr (08/08/19 0144)  . norepinephrine (LEVOPHED) Adult infusion 24 mcg/min (08/08/19 0900)  . phenylephrine (NEO-SYNEPHRINE) Adult infusion Stopped (08/06/19 1643)  . prismasol BGK 4/2.5 2,000 mL/hr at 08/08/19 0608  . vasopressin (PITRESSIN) infusion - *FOR SHOCK* 0.03 Units/min (08/08/19 0900)   . sodium chloride   Intravenous Once  . sodium chloride   Intravenous Once  . chlorhexidine  15 mL Mouth/Throat BID  . Chlorhexidine Gluconate Cloth  6 each Topical Daily  . feeding supplement (PRO-STAT SUGAR FREE 64)  60 mL Per Tube TID  . free water  125 mL Per Tube Q4H  . hydrocortisone sodium succinate  50 mg Intravenous Q6H  . insulin aspart  0-9 Units Subcutaneous Q4H  . mouth rinse  15 mL Mouth Rinse 10 times per day  . oxyCODONE  5 mg Oral Q6H  . pantoprazole  40 mg Intravenous Q12H  . polyethylene glycol  17 g Per Tube BID  . QUEtiapine  25 mg Oral BID  . sennosides  5 mL Per Tube BID  . tamsulosin  0.4 mg Oral BID   Place/Maintain arterial line **  AND** sodium chloride, Place/Maintain arterial line **AND** sodium chloride, acetaminophen (TYLENOL) oral liquid 160 mg/5 mL, acetaminophen, fentaNYL, heparin, influenza vaccine adjuvanted, midazolam, pneumococcal 23 valent vaccine, sodium chloride  Assessment/ Plan:   Acute kidney injury with a baseline of chronic kidney disease with baseline serum creatinine about 1.5 mg/dL.  Admitted with Covid pneumonia complicated course with recent history of MSSA bacteremia.  Developed shock hypotension and requiring pressors.  No evidence of hydronephrosis on renal ultrasound.  This suggests acute tubular necrosis.  Could not identify any additional nephrotoxins.  He did receive vancomycin for 1 day.  There has been administration of contrast but this was early on the course of his illness on 07/25/2019.  His prognosis is extremely poor.  He has been developing  progressive metabolic acidosis initiated CRRT 44/58/4835   Metabolic acidosis this appears to be resolved.  Discontinue IV bicarbonate  Anemia as per primary team transfuse as necessary no transfusion requirements at this time  COVID-19 pneumonia.  Treated with remdesivir, dexamethasone and convalescent plasma.  Shock and hypotension continue pressors  MSSA bacteremia vancomycin and anidulafungin have been discontinued continue cefepime   LOS: Advance @TODAY @10 :46 AM

## 2019-08-08 NOTE — Progress Notes (Signed)
Called pt's wife. Updated of pt condition and plan of care. Obtained blood transfusion consent. Witnessed by 2 RNs. Pt's wife appreciative of update.

## 2019-08-08 NOTE — Progress Notes (Signed)
Endobronchial blocker secure at 57.5.  Will continue to monitor.

## 2019-08-08 NOTE — Progress Notes (Signed)
PROGRESS NOTE    Frank Novelo  BLT:[id number] DOB: 08-28-1951 DOA: 07/25/2019 PCP: Kathyrn Drown, MD   Brief Narrative:  68 year old BM PMHx GERD, BPH   Presented to [facility] ER with shortness of breath and hypoxia.  Patient was diagnosed with COVID 19 on July 13 9019.  Patient's wife also has COVID-19 but is asymptomatic.  Over the last 1 to 2 days, the patient's shortness of breath is increased.  Patient has been suffering with anorexia.  Patient's wife was actually having a conversation via teleconference with the patient's PCP.  PCP is also otherwise physician.  He had actually called to check on how the wife was doing.  The wife had mentioned to the physician that the patient has been laying in bed.  When the physician listened to how the patient was speaking over the phone, it was noted the patient was tachypneic.  EMS was activated.  When EMS arrived, the patient's O2 saturations were apparently in the 40 percentile range.  Patient was placed on nonrebreather.  He was transferred to the ER.  In the ER, his oxygen saturations on 100% nonrebreather were only in the 80s.  Patient was confused.  Patient was emergently intubated.  Patient given IV steroids.  Initial chest x-ray demonstrates diffuse multifocal infiltrates.  CT scan of the chest was negative for PE.  ABG prior to leaving for [place] demonstrates a pH 7.41 PCO2 44 PO2 of 87 on FiO2 100%.  AA gradient calculates out to 571.  I personally called the patient's wife on the evening of July 25, 2019.  During that conversation we had discussed using IV steroids, IV remdesivir.  We also discussed off label use of Actemra and possibly confluence of plasma due to the patient's severe COVID-19 pneumonia.  She verbally agreed that the medical team should try anything possible to make the patient better.  Confirm with the wife the patient is a full code.    ED Course: emergently intubated. Central line placed. Started on  IV Decadron.    Subjective: 10/16 intubated/sedated.  Last 24 hours afebrile.  Overnight increased air leak from right pneumothorax.   Assessment & Plan:   Principal Problem:   Pneumonia due to COVID-19 virus Active Problems:   BPH (benign prostatic hyperplasia)   Acute respiratory failure due to COVID-19 (HCC)   AKI (acute kidney injury) (HCC)   Fluid overload   Pneumothorax on right   Acute renal failure (ARF) (HCC)   GI bleed   Atrial fibrillation with RVR (HCC)  COVID pneumonia/acute respiratory stress with hypoxia/bacterial superinfection - Continue Decadron - Continue Remdesivir per pharmacy protocol - 10/2 Actemra x1 dose - 10/3 counseled patient wife on CCP over phone and she has agreed to allow [place] to administer CCP.  Paperwork in chart  -10/3 transfuse 1 unit CCP - Continue prone patient> 16 hours/day if patient tolerates.  If not prone patient for 2 to 3 hours every shift. -10/3 patient enrolled in [facility]  Lab 08/03/19 1035 08/04/19 0535 08/05/19 0550 08/06/19 0610 08/07/19 0500  CRP 1.1* 0.8 <0.8 4.6* 16.0*   Recent Labs  Lab 08/04/19 0535 08/05/19 0550 08/06/19 0610 08/07/19 0500 08/08/19 0600  DDIMER 5.44* 6.58* 2.98* 4.74* 3.84*  - Titrate patient's vent settings to maintain SPO2 88 to 93%, if possible.  Will use ARDS lung protective protocol (6 to 8 cc/kg/min). - Attempt to maintain plateau pressure<30 - Attempt to maintain driving pressure 15 - 10/5  PF ratio= 170 while prone - 10/6 transfuse 1 unit CCP (this is patient's second unit) -10/10 MSSA bacteremia.  Antibiotics initiated per ID recommendations -10/13 PCXR large RIGHT sided pneumothorax; s/p chest tube placement with reinflation of lung.  High-volume leak (questionable minimal leak at insertion site improved with Vaseline gauze) -10/14 continue broad-spectrum antibiotic and antifungal added -10/14 stress dose steroids added -10/15 s/p bronchoscopy with placement  of endobronchial balloon right intermedius bronchi.  Patient now with minimal air leak -10/16 s/p repeat bronchoscopy to adjust endobronchial balloon.  Airleak now again resealed.  Still minimal air leak.  A. fib RVR -10/16 restart amiodarone drip -Had talked about cardioverting patient but given right pneumothorax with leak and endobronchial balloon in place would not chance procedure.  Septic shock/Hypotension -Continued Norepinephrine and Vasopressin drip to maintain MAP>65  GI bleed  Recent Labs  Lab 08/07/19 0500 08/07/19 0552 08/07/19 1805 08/08/19 0314 08/08/19 0600  HGB 8.1* 8.5* 7.5* 7.1* 7.2*  -Hemoglobin trending down over last 24 hours. -Occult blood pending -10/14 Heparin SQ (hold).   -10/16 GI bleed continuing off Heparin for 48 hrs. . -10/16 Transfuse 1 unit PRBC -Increase Protonix BID---> Protonix gtt -10/16 start Octreotide gtt  -BMP q 8 hr per GI -Discussed case with Dr. Gerilyn Nestle GI.  See her note from 10/16 -10/16 Dr. Roselie Awkward spoke with Dr. Joylene Igo GI and they agreed that if patient continues to bleed will bring equipment to [facility] to perform any required procedure.  Pneumoperitoneum? -Per Dr. [place] Bells radiology contacted him believes has pneumoperitoneum -Negative pneumoperitoneum on lateral decubitus x-ray.  Fluid overload - Strict in and out +4.9 L -Daily weight Filed Weights   08/06/19 0500 08/07/19 0320 08/08/19 0500  Weight: 87.6 kg 90.1 kg 90.1 kg  - CVP daily -10/16 CVP=?  Acute renal failure - Avoid nephrotoxic medication Recent Labs  Lab 08/04/19 0535 08/05/19 0550 08/06/19 0610 08/07/19 0500 08/07/19 1605  CREATININE 2.09* 2.78* 4.25* 5.44* 4.81*  -10/14 renal ultrasound pending -SPEP, UPEP, complements, c-ANCA, urine electrolytes, urine osmolality, serum osmolality, renal ultrasound pending - Spoke with Dr. Justin Mend nephrology will see patient first thing in the a.m. -10/15 Per nephrology begin CRRT  Metabolic  acidosis -Sodium bicarb drip 148ml/hr -10/15 DC sodium bicarb drip start CRRT  BPH -Flomax 0.4 mg BID    Goals of care - CCP use; counseled Mrs. Age that CCP is not improving therapy for COVID-19 though available data shows it may have benefit with high titer plasma is given.  Is unclear if it is effective other patients and with use of low titer plasma.  Risk and benefits of its use were explained to Mrs. Schwenke and she agreed to treatment..  All appropriate IRB consent paperwork has been signed and is in patient's chart.   DVT prophylaxis: Lovenox (ICU dose) Code Status: Full Family Communication: 10/16 Dr. Kathalene Frames spoke with family.   Disposition Plan: TBD   Consultants:  PCCM   Procedures/Significant Events:  10/2 PCXR;-Endotracheal tube remains with tip above the thoracic inlet. Recommend repositioning and advancement. Esophagogastric tube with tip and side port below the diaphragm. - Unchanged diffuse, extensive bilateral heterogeneous opacity, consistent with multifocal infection, edema, and/or ARDS. 10/3 PCXR; slightly improved extensive bilateral heterogeneous opacifications (my read) 10/3 transfuse 1 unit CCP 10/5 transfuse 1 unit FFP 10/6 transfuse 1 unit CCP (this is patient's second unit) 10/10 PCXR - Personally reviewed: Poor inspiration - mild improvement in aeration 10/11 PCXR - Personally reviewed: Left lower lobe  appears to be improving; RLL stable 10/13 PCXR - Personally reviewed: Large R sided pneumothorax - re-expanded after chest tube placed overnight 10/14 PCXR;-small residual right apical pneumothorax, significantly decreased. -Stable extensive patchy opacities throughout both lungs 10/14 DG abdomen decubitus; negative evidence of pneumoperitoneum -Moderate gas in nondependent:; Nonobstructive bowel gas pattern 10/15 s/p bronchoscopy with placement of endobronchial balloon right intermedius bronchi 10/15 renal ultrasound; normal  ultrasound 10/15 Per nephrology begin CRRT 10/15 s/p bronchoscopy place endobronchial balloon 10/16 s/p repeat bronchoscopy to adjust endobronchial balloon 10/16 transfuse 1 unit PRBC    I have personally reviewed and interpreted all radiology studies and my findings are as above. VENTILATOR SETTINGS: Vent mode; PCV Vt Set;  Set rate; 28 FiO2; 100% I time;  PEEP; 10     Cultures 9/29 Novel coronavirus positive 10/2 blood LEFT antecubital NGTD 10/2 blood hand NGTD 10/3 MRSA by PCR negative     Antimicrobials: Anti-infectives (From admission, onward)   Start     Stop   08/07/19 1000  anidulafungin (ERAXIS) 100 mg in sodium chloride 0.9 % 100 mL IVPB         08/07/19 0800  ceFEPIme (MAXIPIME) 2 g in sodium chloride 0.9 % 100 mL IVPB         08/06/19 1000  anidulafungin (ERAXIS) 200 mg in sodium chloride 0.9 % 200 mL IVPB    Note to Pharmacy: please   08/06/19 1514   08/06/19 0800  ceFEPIme (MAXIPIME) 2 g in sodium chloride 0.9 % 100 mL IVPB  Status:  Discontinued     08/06/19 1036   08/02/19 2200  ceFAZolin (ANCEF) IVPB 2g/100 mL premix  Status:  Discontinued     08/06/19 0733   08/02/19 1400  vancomycin (VANCOCIN) 1,250 mg in sodium chloride 0.9 % 250 mL IVPB  Status:  Discontinued     08/02/19 1332   08/01/19 1330  vancomycin (VANCOCIN) 2,000 mg in sodium chloride 0.9 % 500 mL IVPB     08/01/19 1636   08/01/19 1300  ceFEPIme (MAXIPIME) 2 g in sodium chloride 0.9 % 100 mL IVPB  Status:  Discontinued     08/02/19 1332   07/26/19 1600  remdesivir 100 mg in sodium chloride 0.9 % 250 mL IVPB     07/29/19 1823   07/26/19 0215  remdesivir 200 mg in sodium chloride 0.9 % 250 mL IVPB     07/26/19 0520       Devices    LINES / TUBES:  #7.5 ETT 10/2>> RIGHT IJ CVL>>> RIGHT chest tube 10/13>>> LEFT Ig HD cath 10/14>>   Continuous Infusions:   prismasol BGK 4/2.5 500 mL/hr at 08/07/19 2231    prismasol BGK 4/2.5 500 mL/hr at 08/07/19 2231   sodium chloride  Stopped (08/01/19 0957)   sodium chloride 10 mL/hr at 08/08/19 0700   amiodarone Stopped (08/07/19 1715)   anidulafungin Stopped (08/07/19 1014)   ceFEPime (MAXIPIME) IV Stopped (08/07/19 2212)   epinephrine     feeding supplement (PIVOT 1.5 CAL) 1,000 mL (08/07/19 1758)   fentaNYL infusion INTRAVENOUS 50 mcg/hr (08/08/19 0700)   midazolam 1 mg/hr (08/08/19 0144)   norepinephrine (LEVOPHED) Adult infusion 24 mcg/min (08/08/19 0700)   phenylephrine (NEO-SYNEPHRINE) Adult infusion Stopped (08/06/19 1643)   prismasol BGK 4/2.5 2,000 mL/hr at 08/08/19 0608   vasopressin (PITRESSIN) infusion - *FOR SHOCK* 0.03 Units/min (08/08/19 0700)     Objective: Vitals:   08/08/19 0400 08/08/19 0500 08/08/19 0600 08/08/19 0700  BP: 110/73 117/81 111/61 122/76  Pulse:   88 85  Resp: (!) 35 (!) 35 (!) 28 19  Temp:      TempSrc:      SpO2:   100% 100%  Weight:  90.1 kg    Height:        Intake/Output Summary (Last 24 hours) at 08/08/2019 0751 Last data filed at 08/08/2019 0700 Gross per 24 hour  Intake 4142.12 ml  Output 2868 ml  Net 1274.12 ml   Filed Weights   08/06/19 0500 08/07/19 0320 08/08/19 0500  Weight: 87.6 kg 90.1 kg 90.1 kg   Physical Exam:  General: positive acute respiratory distress Eyes: negative scleral hemorrhage, negative anisocoria, negative icterus ENT: Negative Runny nose, negative gingival bleeding, #7.5 ETT tube in place positive endobronchial balloon (reposition today) Neck:  Negative scars, masses, torticollis, lymphadenopathy, JVD Lungs: TACHYPNEIC, RIGHT rhonchi/crackles (significantly decreased with endobronchial balloon), LEFT lung CTA without wheezes or crackles Cardiovascular: Irregular irregular rhythm and rate, without murmur gallop or rub normal S1 and S2 Abdomen: negative abdominal pain, nondistended, positive soft, bowel sounds, no rebound, no ascites, no appreciable mass Extremities: No significant cyanosis, clubbing, or edema bilateral  lower extremities Skin: Negative rashes, lesions, ulcers Psychiatric: Intubated/sedated  Central nervous system: Intubated/sedated             Data Reviewed: Care during the described time interval was provided by me .  I have reviewed this patient's available data, including medical history, events of note, physical examination, and all test results as part of my evaluation.   CBC: Recent Labs  Lab 08/05/19 0550  08/06/19 0610  08/06/19 1750  08/07/19 0500 08/07/19 0552 08/07/19 1805 08/08/19 0314 08/08/19 0600  WBC 31.9*  --  14.8*  --  15.9*  --  12.1*  --   --   --  PENDING  NEUTROABS  --   --   --   --   --   --  11.3*  --   --   --  PENDING  HGB 11.9*   < > 9.8*   < > 9.5*   < > 8.1* 8.5* 7.5* 7.1* 7.2*  HCT 38.7*   < > 32.9*   < > 31.3*   < > 27.2* 25.0* 22.0* 21.0* 22.8*  MCV 99.7  --  103.1*  --  103.3*  --  103.4*  --   --   --  97.4  PLT 395  --  259  --  245  --  204  --   --   --  187   < > = values in this interval not displayed.   Basic Metabolic Panel: Recent Labs  Lab 08/02/19 0457  08/04/19 0535  08/05/19 0550  08/06/19 0610  08/07/19 0500 08/07/19 0552 08/07/19 1605 08/07/19 1805 08/08/19 0314  NA 145   < > 148*   < > 149*   < > 149*   < > 146* 145 145 145 142  K 4.3   < > 5.0   < > 4.3   < > 4.2   < > 5.1 4.9 5.2* 4.6 4.7  CL 99   < > 100  --  103  --  108  --  103  --  106  --   --   CO2 35*   < > 34*  --  29  --  28  --  27  --  25  --   --   GLUCOSE 157*   < >  130*  --  186*  --  229*  --  362*  --  220*  --   --   BUN 74*   < > 104*  --  129*  --  164*  --  185*  --  162*  --   --   CREATININE 1.54*   < > 2.09*  --  2.78*  --  4.25*  --  5.44*  --  4.81*  --   --   CALCIUM 9.4   < > 9.2  --  8.8*  --  7.6*  --  7.1*  --  7.2*  --   --   MG 3.1*  --   --   --   --   --   --   --  4.0*  --   --   --   --   PHOS 3.8  --   --   --   --   --   --   --  11.2*  --  8.1*  --   --    < > = values in this interval not displayed.    GFR: Estimated Creatinine Clearance: 17.1 mL/min (A) (by C-G formula based on SCr of 4.81 mg/dL (H)). Liver Function Tests: Recent Labs  Lab 08/03/19 1035 08/04/19 0535 08/05/19 0550 08/06/19 0610 08/07/19 0500 08/07/19 1605  AST 21 27 31 22 28   --   ALT 28 22 19 9 6   --   ALKPHOS 94 121 104 45 65  --   BILITOT 0.7 0.5 0.6 1.0 0.3  --   PROT 6.5 6.8 6.8 5.3* 5.0*  --   ALBUMIN 3.5 3.5 3.5 3.2* 2.5* 2.7*   No results for input(s): LIPASE, AMYLASE in the last 168 hours. No results for input(s): AMMONIA in the last 168 hours. Coagulation Profile: No results for input(s): INR, PROTIME in the last 168 hours. Cardiac Enzymes: No results for input(s): CKTOTAL, CKMB, CKMBINDEX, TROPONINI in the last 168 hours. BNP (last 3 results) No results for input(s): PROBNP in the last 8760 hours. HbA1C: No results for input(s): HGBA1C in the last 72 hours. CBG: Recent Labs  Lab 08/07/19 1248 08/07/19 1604 08/07/19 1949 08/07/19 2310 08/08/19 0309  GLUCAP 160* 209* 202* 188* 183*   Lipid Profile: No results for input(s): CHOL, HDL, LDLCALC, TRIG, CHOLHDL, LDLDIRECT in the last 72 hours. Thyroid Function Tests: No results for input(s): TSH, T4TOTAL, FREET4, T3FREE, THYROIDAB in the last 72 hours. Anemia Panel: Recent Labs    08/06/19 0610  FERRITIN 681*   Urine analysis:    Component Value Date/Time   COLORURINE YELLOW 08/01/2019 1745   APPEARANCEUR HAZY (A) 08/01/2019 1745   LABSPEC 1.023 08/01/2019 1745   PHURINE 5.0 08/01/2019 1745   GLUCOSEU NEGATIVE 08/01/2019 1745   HGBUR NEGATIVE 08/01/2019 1745   BILIRUBINUR NEGATIVE 08/01/2019 1745   KETONESUR NEGATIVE 08/01/2019 1745   PROTEINUR NEGATIVE 08/01/2019 1745   NITRITE NEGATIVE 08/01/2019 1745   LEUKOCYTESUR NEGATIVE 08/01/2019 1745   Sepsis Labs: @LABRCNTIP (procalcitonin:4,lacticidven:4)  ) Recent Results (from the past 240 hour(s))  Culture, blood (Routine X 2) w Reflex to ID Panel     Status: Abnormal    Collection Time: 08/01/19 12:15 PM   Specimen: BLOOD  Result Value Ref Range Status   Specimen Description   Final    BLOOD RIGHT ARM Performed at [facility], [place] [address], [place], [place] [address]    Special Requests   Final  BOTTLES DRAWN AEROBIC AND ANAEROBIC Blood Culture adequate volume Performed at [facility] [address], [place], [place] [address]    Culture  Setup Time   Final    GRAM POSITIVE COCCI IN CLUSTERS IN BOTH AEROBIC AND ANAEROBIC BOTTLES CRITICAL RESULT CALLED TO, READ BACK BY AND VERIFIED WITH: C. SHADE PHARMD, AT 6301 08/02/19 BY Rush [place]mark Performed at [facility], [place] [address] [place], [place] [address]    Culture STAPHYLOCOCCUS AUREUS (A)  Final   Report Status 08/04/2019 FINAL  Final   Organism ID, Bacteria STAPHYLOCOCCUS AUREUS  Final      Susceptibility   Staphylococcus aureus - MIC*    CIPROFLOXACIN <=0.5 SENSITIVE Sensitive     ERYTHROMYCIN <=0.25 SENSITIVE Sensitive     GENTAMICIN <=0.5 SENSITIVE Sensitive     OXACILLIN 0.5 SENSITIVE Sensitive     TETRACYCLINE <=1 SENSITIVE Sensitive     VANCOMYCIN 1 SENSITIVE Sensitive     TRIMETH/SULFA <=10 SENSITIVE Sensitive     CLINDAMYCIN <=0.25 SENSITIVE Sensitive     RIFAMPIN <=0.5 SENSITIVE Sensitive     Inducible Clindamycin NEGATIVE Sensitive     * STAPHYLOCOCCUS AUREUS  Blood Culture ID Panel (Reflexed)     Status: Abnormal   Collection Time: 08/01/19 12:15 PM  Result Value Ref Range Status   Enterococcus species NOT DETECTED NOT DETECTED Final   Listeria monocytogenes NOT DETECTED NOT DETECTED Final   Staphylococcus species DETECTED (A) NOT DETECTED Final    Comment: CRITICAL RESULT CALLED TO, READ BACK BY AND VERIFIED WITH: C. SHADE PHARMD, AT 3235 08/02/19 BY D. VANHOOK    Staphylococcus aureus (BCID) DETECTED (A) NOT DETECTED Final    Comment: Methicillin (oxacillin) susceptible Staphylococcus aureus (MSSA). Preferred therapy is anti  staphylococcal beta lactam antibiotic (Cefazolin or Nafcillin), unless clinically contraindicated. CRITICAL RESULT CALLED TO, READ BACK BY AND VERIFIED WITH: C. SHADE PHARMD, AT 5732 08/02/19 BY D. VANHOOK    Methicillin resistance NOT DETECTED NOT DETECTED Final   Streptococcus species NOT DETECTED NOT DETECTED Final   Streptococcus agalactiae NOT DETECTED NOT DETECTED Final   Streptococcus pneumoniae NOT DETECTED NOT DETECTED Final   Streptococcus pyogenes NOT DETECTED NOT DETECTED Final   Acinetobacter baumannii NOT DETECTED NOT DETECTED Final   Enterobacteriaceae species NOT DETECTED NOT DETECTED Final   Enterobacter cloacae complex NOT DETECTED NOT DETECTED Final   Escherichia coli NOT DETECTED NOT DETECTED Final   Klebsiella oxytoca NOT DETECTED NOT DETECTED Final   Klebsiella pneumoniae NOT DETECTED NOT DETECTED Final   Proteus species NOT DETECTED NOT DETECTED Final   Serratia marcescens NOT DETECTED NOT DETECTED Final   Haemophilus influenzae NOT DETECTED NOT DETECTED Final   Neisseria meningitidis NOT DETECTED NOT DETECTED Final   Pseudomonas aeruginosa NOT DETECTED NOT DETECTED Final   Candida albicans NOT DETECTED NOT DETECTED Final   Candida glabrata NOT DETECTED NOT DETECTED Final   Candida krusei NOT DETECTED NOT DETECTED Final   Candida parapsilosis NOT DETECTED NOT DETECTED Final   Candida tropicalis NOT DETECTED NOT DETECTED Final    Comment: Performed at [facility], [place] [address] [place], [place] [address]  Culture, blood (Routine X 2) w Reflex to ID Panel     Status: Abnormal   Collection Time: 08/01/19 12:21 PM   Specimen: BLOOD  Result Value Ref Range Status   Specimen Description   Final    BLOOD RIGHT HAND Performed at [facility] [address], [place],  [address]  Special Requests   Final    BOTTLES DRAWN AEROBIC AND ANAEROBIC Blood Culture adequate volume Performed at [facility]  [address], [place], [place] [address]    Culture  Setup Time   Final    GRAM POSITIVE COCCI IN CLUSTERS IN BOTH AEROBIC AND ANAEROBIC BOTTLES CRITICAL RESULT CALLED TO, READ BACK BY AND VERIFIED WITH: C. SHADE PHARMD, AT 9937 08/02/19 BY D. VANHOOK    Culture (A)  Final    STAPHYLOCOCCUS AUREUS SUSCEPTIBILITIES PERFORMED ON PREVIOUS CULTURE WITHIN THE LAST 5 DAYS. Performed at [facility], [place] [address] [place], [place] [address]    Report Status 08/04/2019 FINAL  Final  Culture, respiratory     Status: None   Collection Time: 08/01/19  3:30 PM   Specimen: Tracheal Aspirate  Result Value Ref Range Status   Specimen Description   Final    TRACHEAL ASPIRATE Performed at [facility] [address] [place], [place] [address]    Special Requests   Final    NONE Performed at [facility], [place] [address], [place], [place] [address]    Gram Stain   Final    RARE WBC PRESENT, PREDOMINANTLY PMN ABUNDANT GRAM POSITIVE COCCI IN CLUSTERS Performed at [facility], [place] [address], [place], [place] [address]    Culture   Final    MODERATE STAPHYLOCOCCUS AUREUS RARE PSEUDOMONAS AERUGINOSA    Report Status 08/06/2019 FINAL  Final   Organism ID, Bacteria STAPHYLOCOCCUS AUREUS  Final   Organism ID, Bacteria PSEUDOMONAS AERUGINOSA  Final      Susceptibility   Pseudomonas aeruginosa - MIC*    CEFTAZIDIME 4 SENSITIVE Sensitive     CIPROFLOXACIN 0.5 SENSITIVE Sensitive     GENTAMICIN <=1 SENSITIVE Sensitive     IMIPENEM 1 SENSITIVE Sensitive     PIP/TAZO 16 SENSITIVE Sensitive     CEFEPIME 4 SENSITIVE Sensitive     * RARE PSEUDOMONAS AERUGINOSA   Staphylococcus aureus - MIC*    CIPROFLOXACIN <=0.5 SENSITIVE Sensitive     ERYTHROMYCIN <=0.25 SENSITIVE Sensitive     GENTAMICIN <=0.5 SENSITIVE Sensitive     OXACILLIN 0.5 SENSITIVE Sensitive     TETRACYCLINE <=1 SENSITIVE Sensitive     VANCOMYCIN <=0.5 SENSITIVE Sensitive      TRIMETH/SULFA <=10 SENSITIVE Sensitive     CLINDAMYCIN <=0.25 SENSITIVE Sensitive     RIFAMPIN <=0.5 SENSITIVE Sensitive     Inducible Clindamycin NEGATIVE Sensitive     * MODERATE STAPHYLOCOCCUS AUREUS  Culture, Urine     Status: Abnormal   Collection Time: 08/01/19  5:45 PM   Specimen: Urine, Catheterized  Result Value Ref Range Status   Specimen Description   Final    URINE, CATHETERIZED Performed at [facility] [address] [place], [place] [address]    Special Requests   Final    NONE Performed at [facility], [place] [address] [place], [place] [address]    Culture (A)  Final    <10,000 COLONIES/mL INSIGNIFICANT GROWTH Performed at [facility] [address], [place], [place] [address]    Report Status 08/03/2019 FINAL  Final  MRSA PCR Screening     Status: None   Collection Time: 08/02/19 10:02 AM   Specimen: Nasal Mucosa; Nasopharyngeal  Result Value Ref Range Status   MRSA by PCR NEGATIVE NEGATIVE Final    Comment:        The GeneXpert MRSA  Assay (FDA approved for NASAL specimens only), is one component of a comprehensive MRSA colonization surveillance program. It is not intended to diagnose MRSA infection nor to guide or monitor treatment for MRSA infections. Performed at [facility], [place] [address] [place], [place] [address]   Culture, blood (routine x 2)     Status: None (Preliminary result)   Collection Time: 08/05/19  5:00 AM   Specimen: BLOOD RIGHT HAND  Result Value Ref Range Status   Specimen Description   Final    BLOOD RIGHT HAND Performed at [facility] [address] [place], [place] [address]    Special Requests   Final    BOTTLES DRAWN AEROBIC ONLY Blood Culture adequate volume Performed at [facility] [address], [place], [place] [address]    Culture   Final    NO GROWTH 2 DAYS Performed at [facility]  [address], [place], [place] [address]    Report Status PENDING  Incomplete  Culture, blood (routine x 2)     Status: None (Preliminary result)   Collection Time: 08/05/19  5:05 AM   Specimen: BLOOD LEFT HAND  Result Value Ref Range Status   Specimen Description   Final    BLOOD LEFT HAND Performed at [facility] [address] [place], [place] [address]    Special Requests   Final    BOTTLES DRAWN AEROBIC ONLY Blood Culture adequate volume Performed at [facility] [address], [place], [place] [address]    Culture   Final    NO GROWTH 2 DAYS Performed at [facility] [address] [place], [place] [address]    Report Status PENDING  Incomplete         Radiology Studies: US Renal  Result Date: 08/07/2019 CLINICAL DATA:  Acute renal failure. EXAM: RENAL / URINARY TRACT ULTRASOUND COMPLETE COMPARISON:  None. FINDINGS: Right Kidney: Renal measurements: 10.0 x 5.9 x 4.8 cm = volume: 146 mL . Echogenicity within normal limits. No mass or hydronephrosis visualized. Left Kidney: Renal measurements: 11.2 x 6.8 x 5.3 cm = volume: 215 mL. Echogenicity within normal limits. No mass or hydronephrosis visualized. Bladder: Appears normal for degree of bladder distention. Other: None. IMPRESSION: Normal renal ultrasound. Electronically Signed   By: Marijo Conception M.D.   On: 08/07/2019 09:47   Dg Chest Port 1 View  Result Date: 08/07/2019 CLINICAL DATA:  Evaluate endotracheal tube position. COVID-19 positive. EXAM: PORTABLE CHEST 1 VIEW COMPARISON:  08/06/2019 FINDINGS: Endotracheal tube terminates 4.9 cm above carina. Nasogastric tube extends beyond the inferior aspect of the film. A right-sided chest tube remains in place. The right-sided pleural drain has been removed. Left internal jugular line tip at mid SVC. Right subclavian line tip at high right atrium, similar. Mild cardiomegaly. Increased fluid within the right minor fissure. The previously  described right apical pneumothorax is either improved or resolved. Possible trace pleural air remaining. Increased right base airspace disease. Relatively similar patchy right upper and left mid to lower lung pulmonary opacities. IMPRESSION: Appropriate position of endotracheal tube. Single right chest tube remaining in place, with improved to resolved right apical pneumothorax. Increased right pleural fluid with worsened right base airspace disease. Otherwise similar multifocal pneumonia. Electronically Signed   By: Abigail Miyamoto M.D.   On: 08/07/2019 20:18   Dg Chest Port 1 View  Result Date: 08/06/2019 CLINICAL DATA:  Chest tube, follow-up pneumothorax EXAM:  PORTABLE CHEST 1 VIEW COMPARISON:  Chest radiograph from earlier today. FINDINGS: Endotracheal tube tip is 4.2 cm above the carina. Enteric tube enters stomach with the tip not seen on this image. Left internal jugular central venous catheter terminates in the middle third of the SVC. Right subclavian central venous catheter terminates over the right atrium. Stable right pigtail chest tube with tip in the peripheral lower right pleural space. Separate right basilar chest tube. Stable cardiomediastinal silhouette with normal heart size. Small residual right apical pneumothorax, significantly decreased. No left pneumothorax. Midline mediastinum. No pleural effusion. Extensive patchy opacities throughout both lungs appear unchanged. IMPRESSION: 1. Small residual right apical pneumothorax, significantly decreased. 2. Stable extensive patchy opacities throughout both lungs. 3. Well-positioned support structures as described. Electronically Signed   By: Ilona Sorrel M.D.   On: 08/06/2019 15:25   Dg Chest Port 1 View  Addendum Date: 08/06/2019   ADDENDUM REPORT: 08/06/2019 13:46 ADDENDUM: These results were called by telephone at the time of interpretation on 08/06/2019 at 1:43 pm to provider Dr. Roselie Awkward, who verbally acknowledged these results.  Electronically Signed   By: Aletta Edouard M.D.   On: 08/06/2019 13:46   Result Date: 08/06/2019 CLINICAL DATA:  Replacement of non tunneled dialysis catheter. Respiratory failure secondary to COVID-19 pneumonia. EXAM: PORTABLE CHEST 1 VIEW COMPARISON:  Film earlier today at 1119 hours FINDINGS: Replaced left jugular non tunneled dialysis catheter now projects inferiorly with the tip in the SVC. Other support apparatus stable with the endotracheal tube tip approximately 5 cm above the carina. Right lateral pneumothorax is slightly larger with stable positioning of a lateral pigtail thoracostomy tube. Bilateral severe pneumonia appears stable since the prior chest x-ray. Lucency abutting the right hemidiaphragm may relate to a component subpulmonic air. However, free intraperitoneal air under the right hemidiaphragm is not entirely excluded by semi-erect chest x-ray. IMPRESSION: 1. Replaced left jugular non tunneled dialysis catheter tip is in the SVC. 2. Slightly larger right lateral pneumothorax. 3. Cannot exclude free air under the right hemidiaphragm. However, the appearance may relate to relative lucency of the lung at the right lung base and component of basilar pneumothorax as well. Electronically Signed: By: Aletta Edouard M.D. On: 08/06/2019 13:41   Dg Chest Port 1 View  Result Date: 08/06/2019 CLINICAL DATA:  COVID.  Insertion of dialysis catheter EXAM: PORTABLE CHEST 1 VIEW COMPARISON:  None. FINDINGS: Left internal jugular dialysis catheter courses across the midline and likely passes into the right innominate vein. Right central line tip is at the cavoatrial junction. Endotracheal tube and NG tube are unchanged. Cardiomegaly. Patchy bilateral airspace opacities again noted, unchanged. Right chest tube remains in place with right basilar pneumothorax, stable. IMPRESSION: Interval placement of left internal jugular Vas-Cath. The tip likely passes into the right innominate vein. No left  pneumothorax. Right chest tube remains in place with small right basilar pneumothorax, stable. Stable patchy bilateral airspace opacities. Electronically Signed   By: Rolm Baptise M.D.   On: 08/06/2019 11:56   Dg Abd Decub  Result Date: 08/06/2019 CLINICAL DATA:  Rule out pneumoperitoneum EXAM: ABDOMEN - 1 VIEW DECUBITUS COMPARISON:  08/02/2019 abdominal radiographs FINDINGS: No evidence of pneumatosis or pneumoperitoneum. No disproportionately dilated small bowel loops. Moderate gas in the nondependent colon. Enteric tube tip is seen in the proximal stomach. IMPRESSION: 1. No evidence of pneumoperitoneum. 2. Moderate gas in the nondependent colon. Nonobstructive bowel gas pattern. 3. Enteric tube tip in the proximal stomach. Electronically Signed   By: Corene Cornea  A Poff M.D.   On: 08/06/2019 17:16        Scheduled Meds:  sodium chloride   Intravenous Once   chlorhexidine  15 mL Mouth/Throat BID   Chlorhexidine Gluconate Cloth  6 each Topical Daily   feeding supplement (PRO-STAT SUGAR FREE 64)  60 mL Per Tube TID   free water  125 mL Per Tube Q4H   hydrocortisone sodium succinate  50 mg Intravenous Q6H   insulin aspart  0-9 Units Subcutaneous Q4H   mouth rinse  15 mL Mouth Rinse 10 times per day   oxyCODONE  5 mg Oral Q6H   pantoprazole  40 mg Intravenous Q12H   polyethylene glycol  17 g Per Tube BID   QUEtiapine  25 mg Oral BID   sennosides  5 mL Per Tube BID   tamsulosin  0.4 mg Oral BID   Continuous Infusions:   prismasol BGK 4/2.5 500 mL/hr at 08/07/19 2231    prismasol BGK 4/2.5 500 mL/hr at 08/07/19 2231   sodium chloride Stopped (08/01/19 0957)   sodium chloride 10 mL/hr at 08/08/19 0700   amiodarone Stopped (08/07/19 1715)   anidulafungin Stopped (08/07/19 1014)   ceFEPime (MAXIPIME) IV Stopped (08/07/19 2212)   epinephrine     feeding supplement (PIVOT 1.5 CAL) 1,000 mL (08/07/19 1758)   fentaNYL infusion INTRAVENOUS 50 mcg/hr (08/08/19 0700)    midazolam 1 mg/hr (08/08/19 0144)   norepinephrine (LEVOPHED) Adult infusion 24 mcg/min (08/08/19 0700)   phenylephrine (NEO-SYNEPHRINE) Adult infusion Stopped (08/06/19 1643)   prismasol BGK 4/2.5 2,000 mL/hr at 08/08/19 0608   vasopressin (PITRESSIN) infusion - *FOR SHOCK* 0.03 Units/min (08/08/19 0700)     LOS: 14 days   The patient is critically ill with multiple organ systems failure and requires high complexity decision making for assessment and support, frequent evaluation and titration of therapies, application of advanced monitoring technologies and extensive interpretation of multiple databases. Critical Care Time devoted to patient care services described in this note  Time spent: 40 minutes     Sesar Madewell, Geraldo Docker, MD [facility] Pager [phone]  If 7PM-7AM, please contact night-coverage [email or web address] Password [email or web address] 08/08/2019, 7:51 AM

## 2019-08-08 NOTE — Progress Notes (Signed)
Report received and assumed care. Lines checked and orders reviewed. Air leak now present on Chest tube. Otherwise vital signs stable. Dr. Lake Bells notified.

## 2019-08-08 NOTE — Progress Notes (Signed)
Endobronchial blocker remains secure at 57.5. RT will continue to monitor.

## 2019-08-08 NOTE — Progress Notes (Signed)
Dr.McQuaid at bedside to evaluate endobronchial blocker. Bronchoscope used to advance it from 54 to 57.5 and resecured. Balloon remains inflated with 2cc of saline. Pt tolerated well. RT will continue to monitor.

## 2019-08-09 ENCOUNTER — Encounter (HOSPITAL_COMMUNITY): Payer: Self-pay | Admitting: Certified Registered"

## 2019-08-09 ENCOUNTER — Inpatient Hospital Stay (HOSPITAL_COMMUNITY): Payer: Medicare HMO

## 2019-08-09 ENCOUNTER — Encounter (HOSPITAL_COMMUNITY)
Admission: EM | Disposition: A | Discharge status: Rehabilitation Facility | Payer: Self-pay | Source: Home / Self Care | Attending: Internal Medicine

## 2019-08-09 ENCOUNTER — Encounter (HOSPITAL_COMMUNITY): Payer: Self-pay | Admitting: Nephrology

## 2019-08-09 DIAGNOSIS — Z9689 Presence of other specified functional implants: Secondary | ICD-10-CM | POA: Diagnosis not present

## 2019-08-09 DIAGNOSIS — D649 Anemia, unspecified: Secondary | ICD-10-CM | POA: Diagnosis not present

## 2019-08-09 DIAGNOSIS — U071 COVID-19: Secondary | ICD-10-CM | POA: Diagnosis not present

## 2019-08-09 DIAGNOSIS — J1289 Other viral pneumonia: Secondary | ICD-10-CM | POA: Diagnosis not present

## 2019-08-09 DIAGNOSIS — J9601 Acute respiratory failure with hypoxia: Secondary | ICD-10-CM | POA: Diagnosis not present

## 2019-08-09 DIAGNOSIS — K254 Chronic or unspecified gastric ulcer with hemorrhage: Secondary | ICD-10-CM

## 2019-08-09 DIAGNOSIS — E8779 Other fluid overload: Secondary | ICD-10-CM | POA: Diagnosis not present

## 2019-08-09 DIAGNOSIS — K5900 Constipation, unspecified: Secondary | ICD-10-CM | POA: Diagnosis present

## 2019-08-09 DIAGNOSIS — K668 Other specified disorders of peritoneum: Secondary | ICD-10-CM

## 2019-08-09 DIAGNOSIS — J80 Acute respiratory distress syndrome: Secondary | ICD-10-CM | POA: Diagnosis not present

## 2019-08-09 DIAGNOSIS — K922 Gastrointestinal hemorrhage, unspecified: Secondary | ICD-10-CM

## 2019-08-09 DIAGNOSIS — E872 Acidosis: Secondary | ICD-10-CM | POA: Diagnosis not present

## 2019-08-09 DIAGNOSIS — K921 Melena: Secondary | ICD-10-CM | POA: Diagnosis not present

## 2019-08-09 DIAGNOSIS — I4891 Unspecified atrial fibrillation: Secondary | ICD-10-CM

## 2019-08-09 DIAGNOSIS — R579 Shock, unspecified: Secondary | ICD-10-CM | POA: Diagnosis not present

## 2019-08-09 DIAGNOSIS — N179 Acute kidney failure, unspecified: Secondary | ICD-10-CM | POA: Diagnosis not present

## 2019-08-09 DIAGNOSIS — K5909 Other constipation: Secondary | ICD-10-CM

## 2019-08-09 DIAGNOSIS — I959 Hypotension, unspecified: Secondary | ICD-10-CM | POA: Diagnosis not present

## 2019-08-09 DIAGNOSIS — J9600 Acute respiratory failure, unspecified whether with hypoxia or hypercapnia: Secondary | ICD-10-CM | POA: Diagnosis not present

## 2019-08-09 DIAGNOSIS — A4901 Methicillin susceptible Staphylococcus aureus infection, unspecified site: Secondary | ICD-10-CM | POA: Diagnosis not present

## 2019-08-09 DIAGNOSIS — J939 Pneumothorax, unspecified: Secondary | ICD-10-CM | POA: Diagnosis not present

## 2019-08-09 HISTORY — PX: HEMOSTASIS CLIP PLACEMENT: SHX6857

## 2019-08-09 HISTORY — PX: HEMOSTASIS CONTROL: SHX6838

## 2019-08-09 HISTORY — PX: ESOPHAGOGASTRODUODENOSCOPY: SHX5428

## 2019-08-09 LAB — CBC WITH DIFFERENTIAL/PLATELET
Abs Immature Granulocytes: 0.15 10*3/uL — ABNORMAL HIGH (ref 0.00–0.07)
Basophils Absolute: 0 10*3/uL (ref 0.0–0.1)
Basophils Relative: 0 %
Eosinophils Absolute: 0 10*3/uL (ref 0.0–0.5)
Eosinophils Relative: 0 %
HCT: 24.6 % — ABNORMAL LOW (ref 39.0–52.0)
Hemoglobin: 7.9 g/dL — ABNORMAL LOW (ref 13.0–17.0)
Immature Granulocytes: 2 %
Lymphocytes Relative: 3 %
Lymphs Abs: 0.3 10*3/uL — ABNORMAL LOW (ref 0.7–4.0)
MCH: 30.3 pg (ref 26.0–34.0)
MCHC: 32.1 g/dL (ref 30.0–36.0)
MCV: 94.3 fL (ref 80.0–100.0)
Monocytes Absolute: 0.4 10*3/uL (ref 0.1–1.0)
Monocytes Relative: 4 %
Neutro Abs: 9.2 10*3/uL — ABNORMAL HIGH (ref 1.7–7.7)
Neutrophils Relative %: 91 %
Platelets: 131 10*3/uL — ABNORMAL LOW (ref 150–400)
RBC: 2.61 MIL/uL — ABNORMAL LOW (ref 4.22–5.81)
RDW: 15.9 % — ABNORMAL HIGH (ref 11.5–15.5)
WBC: 10.1 10*3/uL (ref 4.0–10.5)
nRBC: 5.5 % — ABNORMAL HIGH (ref 0.0–0.2)

## 2019-08-09 LAB — BASIC METABOLIC PANEL
Anion gap: 11 (ref 5–15)
Anion gap: 11 (ref 5–15)
BUN: 94 mg/dL — ABNORMAL HIGH (ref 8–23)
BUN: 86 mg/dL — ABNORMAL HIGH (ref 8–23)
CO2: 21 mmol/L — ABNORMAL LOW (ref 22–32)
CO2: 22 mmol/L (ref 22–32)
Calcium: 7.9 mg/dL — ABNORMAL LOW (ref 8.9–10.3)
Calcium: 7.7 mg/dL — ABNORMAL LOW (ref 8.9–10.3)
Chloride: 109 mmol/L (ref 98–111)
Chloride: 110 mmol/L (ref 98–111)
Creatinine, Ser: 2.97 mg/dL — ABNORMAL HIGH (ref 0.61–1.24)
Creatinine, Ser: 3.36 mg/dL — ABNORMAL HIGH (ref 0.61–1.24)
GFR calc Af Amer: 24 mL/min — ABNORMAL LOW (ref >60)
GFR calc Af Amer: 21 mL/min — ABNORMAL LOW (ref >60)
GFR calc non Af Amer: 21 mL/min — ABNORMAL LOW (ref >60)
GFR calc non Af Amer: 18 mL/min — ABNORMAL LOW (ref >60)
Glucose, Bld: 194 mg/dL — ABNORMAL HIGH (ref 70–99)
Glucose, Bld: 152 mg/dL — ABNORMAL HIGH (ref 70–99)
Potassium: 5.2 mmol/L — ABNORMAL HIGH (ref 3.5–5.1)
Potassium: 5.8 mmol/L — ABNORMAL HIGH (ref 3.5–5.1)
Sodium: 141 mmol/L (ref 135–145)
Sodium: 143 mmol/L (ref 135–145)

## 2019-08-09 LAB — CBC
HCT: 25.7 % — ABNORMAL LOW (ref 39.0–52.0)
Hemoglobin: 8.3 g/dL — ABNORMAL LOW (ref 13.0–17.0)
MCH: 31 pg (ref 26.0–34.0)
MCHC: 32.3 g/dL (ref 30.0–36.0)
MCV: 95.9 fL (ref 80.0–100.0)
Platelets: 128 10*3/uL — ABNORMAL LOW (ref 150–400)
RBC: 2.68 MIL/uL — ABNORMAL LOW (ref 4.22–5.81)
RDW: 16.1 % — ABNORMAL HIGH (ref 11.5–15.5)
WBC: 10 10*3/uL (ref 4.0–10.5)
nRBC: 6.6 % — ABNORMAL HIGH (ref 0.0–0.2)

## 2019-08-09 LAB — POCT I-STAT 7, (LYTES, BLD GAS, ICA,H+H)
Acid-base deficit: 2 mmol/L (ref 0.0–2.0)
Acid-base deficit: 3 mmol/L — ABNORMAL HIGH (ref 0.0–2.0)
Bicarbonate: 23.8 mmol/L (ref 20.0–28.0)
Bicarbonate: 22.4 mmol/L (ref 20.0–28.0)
Bicarbonate: 22.6 mmol/L (ref 20.0–28.0)
Calcium, Ion: 1.13 mmol/L — ABNORMAL LOW (ref 1.15–1.40)
Calcium, Ion: 1.14 mmol/L — ABNORMAL LOW (ref 1.15–1.40)
Calcium, Ion: 1.15 mmol/L (ref 1.15–1.40)
HCT: 25 % — ABNORMAL LOW (ref 39.0–52.0)
HCT: 24 % — ABNORMAL LOW (ref 39.0–52.0)
HCT: 23 % — ABNORMAL LOW (ref 39.0–52.0)
Hemoglobin: 8.5 g/dL — ABNORMAL LOW (ref 13.0–17.0)
Hemoglobin: 8.2 g/dL — ABNORMAL LOW (ref 13.0–17.0)
Hemoglobin: 7.8 g/dL — ABNORMAL LOW (ref 13.0–17.0)
O2 Saturation: 95 %
O2 Saturation: 94 %
O2 Saturation: 94 %
Patient temperature: 36.3
Patient temperature: 36.6
Patient temperature: 36.8
Potassium: 4.7 mmol/L (ref 3.5–5.1)
Potassium: 4.8 mmol/L (ref 3.5–5.1)
Potassium: 5 mmol/L (ref 3.5–5.1)
Sodium: 143 mmol/L (ref 135–145)
Sodium: 142 mmol/L (ref 135–145)
Sodium: 141 mmol/L (ref 135–145)
TCO2: 25 mmol/L (ref 22–32)
TCO2: 23 mmol/L (ref 22–32)
TCO2: 24 mmol/L (ref 22–32)
pCO2 arterial: 35.2 mmHg (ref 32.0–48.0)
pCO2 arterial: 34.7 mmHg (ref 32.0–48.0)
pCO2 arterial: 40.7 mmHg (ref 32.0–48.0)
pH, Arterial: 7.434 (ref 7.350–7.450)
pH, Arterial: 7.416 (ref 7.350–7.450)
pH, Arterial: 7.351 (ref 7.350–7.450)
pO2, Arterial: 68 mmHg — ABNORMAL LOW (ref 83.0–108.0)
pO2, Arterial: 67 mmHg — ABNORMAL LOW (ref 83.0–108.0)
pO2, Arterial: 73 mmHg — ABNORMAL LOW (ref 83.0–108.0)

## 2019-08-09 LAB — RENAL FUNCTION PANEL
Albumin: 1.9 g/dL — ABNORMAL LOW (ref 3.5–5.0)
Anion gap: 9 (ref 5–15)
BUN: 87 mg/dL — ABNORMAL HIGH (ref 8–23)
CO2: 18 mmol/L — ABNORMAL LOW (ref 22–32)
Calcium: 6.4 mg/dL — CL (ref 8.9–10.3)
Chloride: 117 mmol/L — ABNORMAL HIGH (ref 98–111)
Creatinine, Ser: 2.65 mg/dL — ABNORMAL HIGH (ref 0.61–1.24)
GFR calc Af Amer: 28 mL/min — ABNORMAL LOW (ref >60)
GFR calc non Af Amer: 24 mL/min — ABNORMAL LOW (ref >60)
Glucose, Bld: 139 mg/dL — ABNORMAL HIGH (ref 70–99)
Phosphorus: 5.8 mg/dL — ABNORMAL HIGH (ref 2.5–4.6)
Potassium: 4.5 mmol/L (ref 3.5–5.1)
Sodium: 144 mmol/L (ref 135–145)

## 2019-08-09 LAB — COMPREHENSIVE METABOLIC PANEL
ALT: 5 U/L (ref 0–44)
AST: 39 U/L (ref 15–41)
Albumin: 2.4 g/dL — ABNORMAL LOW (ref 3.5–5.0)
Alkaline Phosphatase: 91 U/L (ref 38–126)
Anion gap: 13 (ref 5–15)
BUN: 83 mg/dL — ABNORMAL HIGH (ref 8–23)
CO2: 22 mmol/L (ref 22–32)
Calcium: 7.7 mg/dL — ABNORMAL LOW (ref 8.9–10.3)
Chloride: 109 mmol/L (ref 98–111)
Creatinine, Ser: 2.67 mg/dL — ABNORMAL HIGH (ref 0.61–1.24)
GFR calc Af Amer: 28 mL/min — ABNORMAL LOW (ref >60)
GFR calc non Af Amer: 24 mL/min — ABNORMAL LOW (ref >60)
Glucose, Bld: 172 mg/dL — ABNORMAL HIGH (ref 70–99)
Potassium: 5 mmol/L (ref 3.5–5.1)
Sodium: 144 mmol/L (ref 135–145)
Total Bilirubin: 0.5 mg/dL (ref 0.3–1.2)
Total Protein: 5.7 g/dL — ABNORMAL LOW (ref 6.5–8.1)

## 2019-08-09 LAB — GLUCOSE, CAPILLARY
Glucose-Capillary: 159 mg/dL — ABNORMAL HIGH (ref 70–99)
Glucose-Capillary: 198 mg/dL — ABNORMAL HIGH (ref 70–99)
Glucose-Capillary: 184 mg/dL — ABNORMAL HIGH (ref 70–99)
Glucose-Capillary: 136 mg/dL — ABNORMAL HIGH (ref 70–99)

## 2019-08-09 LAB — TYPE AND SCREEN
ABO/RH(D): O POS
Antibody Screen: NEGATIVE
Unit division: 0

## 2019-08-09 LAB — C-REACTIVE PROTEIN: CRP: 5.4 mg/dL — ABNORMAL HIGH (ref <1.0)

## 2019-08-09 LAB — BPAM RBC
Blood Product Expiration Date: 202011152359
ISSUE DATE / TIME: 202010161014
Unit Type and Rh: 5100

## 2019-08-09 LAB — MAGNESIUM: Magnesium: 2.8 mg/dL — ABNORMAL HIGH (ref 1.7–2.4)

## 2019-08-09 LAB — PHOSPHORUS: Phosphorus: 4.4 mg/dL (ref 2.5–4.6)

## 2019-08-09 LAB — D-DIMER, QUANTITATIVE: D-Dimer, Quant: 4.64 ug/mL-FEU — ABNORMAL HIGH (ref 0.00–0.50)

## 2019-08-09 SURGERY — EGD (ESOPHAGOGASTRODUODENOSCOPY)
Anesthesia: Moderate Sedation

## 2019-08-09 MED ORDER — ALTEPLASE 2 MG IJ SOLR
2.0000 mg | Freq: Once | INTRAMUSCULAR | Status: AC | PRN
  Administered 2019-08-09: 2 mg
  Filled 2019-08-09: qty 2

## 2019-08-09 MED ORDER — SODIUM CHLORIDE (PF) 0.9 % IJ SOLN
PREFILLED_SYRINGE | INTRAMUSCULAR | Status: DC | PRN
  Administered 2019-08-09: 16:00:00 4 mL

## 2019-08-09 MED ORDER — DEXTROSE 5 % IV SOLN
20.0000 g | INTRAVENOUS | Status: DC
  Administered 2019-08-09 – 2019-08-12 (×4): 20 g via INTRAVENOUS_CENTRAL
  Filled 2019-08-09 (×4): qty 200

## 2019-08-09 MED ORDER — AMIODARONE HCL IN DEXTROSE 360-4.14 MG/200ML-% IV SOLN
30.0000 mg/h | INTRAVENOUS | Status: DC
  Filled 2019-08-09: qty 200

## 2019-08-09 MED ORDER — SUCRALFATE 1 GM/10ML PO SUSP
1.0000 g | Freq: Four times a day (QID) | ORAL | Status: DC
  Administered 2019-08-09 – 2019-10-03 (×201): 1 g via ORAL
  Filled 2019-08-09 (×213): qty 10

## 2019-08-09 MED ORDER — AMIODARONE HCL IN DEXTROSE 360-4.14 MG/200ML-% IV SOLN
30.0000 mg/h | INTRAVENOUS | Status: DC
  Administered 2019-08-09 – 2019-08-13 (×10): 30 mg/h via INTRAVENOUS
  Filled 2019-08-09 (×9): qty 200

## 2019-08-09 MED ORDER — METOPROLOL TARTRATE 5 MG/5ML IV SOLN
2.5000 mg | Freq: Once | INTRAVENOUS | Status: AC
  Administered 2019-08-09: 07:00:00 2.5 mg via INTRAVENOUS
  Filled 2019-08-09: qty 5

## 2019-08-09 MED ORDER — SODIUM CHLORIDE 0.9 % IV SOLN
0.0000 ug/kg/min | INTRAVENOUS | Status: DC
  Administered 2019-08-09: 3 ug/kg/min via INTRAVENOUS
  Administered 2019-08-10 (×2): 2 ug/kg/min via INTRAVENOUS
  Administered 2019-08-10: 1.5 ug/kg/min via INTRAVENOUS
  Administered 2019-08-12 – 2019-08-13 (×4): 2.5 ug/kg/min via INTRAVENOUS
  Filled 2019-08-09 (×9): qty 20

## 2019-08-09 MED ORDER — DEXTROSE 5 % IV SOLN
Status: DC
  Administered 2019-08-09 – 2019-08-11 (×5): via INTRAVENOUS_CENTRAL
  Filled 2019-08-09 (×6): qty 1500

## 2019-08-09 MED ORDER — CISATRACURIUM BESYLATE (PF) 10 MG/5ML IV SOLN
10.0000 mg | INTRAVENOUS | Status: DC | PRN
  Administered 2019-08-09: 10 mg via INTRAVENOUS
  Filled 2019-08-09 (×2): qty 5

## 2019-08-09 MED ORDER — ARTIFICIAL TEARS OPHTHALMIC OINT
1.0000 "application " | TOPICAL_OINTMENT | Freq: Three times a day (TID) | OPHTHALMIC | Status: DC
  Administered 2019-08-09 – 2019-08-13 (×13): 1 via OPHTHALMIC
  Filled 2019-08-09 (×2): qty 3.5

## 2019-08-09 MED ORDER — MAGNESIUM CITRATE PO SOLN: 0.5000 | Freq: Once | ORAL | Status: DC

## 2019-08-09 NOTE — Progress Notes (Signed)
Arterial gas drawn on the following settings: PC20/RR28/50%/+10

## 2019-08-09 NOTE — Progress Notes (Signed)
NAME:  [name], MRN:  [id number], DOB:  [birth year], LOS: 64 ADMISSION DATE:  07/25/2019, CONSULTATION DATE:  10/3 REFERRING MD:  Bridgett Larsson, CHIEF COMPLAINT:  Dyspnea   Brief History   68 y/o male admitted on 10/3 from the [facility] ED with ARDS from COVID pneumonia leading to need for mechanical ventilation.    Past Medical History  COVID 19 Heartburn  Significant Hospital Events   10/3 admission 10/10 weaned 12 hours 10/11 dark stool 10/13 pneumothorax 10/15 endobronchial blocker placed 10/16 EB blocker repositioned   Consults:  PCCM  Procedures:  10/2 ETT>  10/2 R IJ CVL > 10/11 10/14 R chest tube >  10/14 R subclavian CVL >  10/14 L IJ HD cath >   Significant Diagnostic Tests:  10/2 CT angiogram chest > personally reviewed, extensive bilateral airspace disease predominantly posterior  Micro Data:  9/20 SARS COV 2 > POSITIVE 10/2 blood >  10/9 blood > MSSA 2/4 10/9 urine >  10/9 resp > MSSA, psuedomonas  10/13 blood >  10/14 blood >   Antimicrobials/COVID Rx  10/3 remdesivir > 10/6 10/3 actemra 10/3 decadron > 10/4 convalescent plasma, repeat 10/6  10/9 vanc > 10/10 10/9 cefepime > 10/10 10/10 Ancef > 10/13 10/13 Cefepime >  10/14 anidulofungin> 10/15  Interim history/subjective:   Endobronchial blocker repositioned yesterday Air leak reduced but still present.  Endobronchial blocker remains in position at 57 cm.   Objective   Blood pressure 117/73, pulse (!) 106, temperature 98.1 F (36.7 C), resp. rate (!) 29, height 5\' 10"  (1.778 m), weight 95.3 kg, SpO2 95 %. CVP:  [6 mmHg] 6 mmHg  Vent Mode: PRVC FiO2 (%):  [50 %] 50 % Set Rate:  [28 bmp-30 bmp] 30 bmp Vt Set:  [480 mL] 480 mL PEEP:  [10 cmH20] 10 cmH20 Plateau Pressure:  [27 cmH20-31 cmH20] 29 cmH20   Intake/Output Summary (Last 24 hours) at 08/09/2019 1423 Last data filed at 08/09/2019 1024 Gross per 24 hour  Intake 3755.31 ml  Output 3322 ml  Net 433.31 ml   Filed Weights   08/07/19 0320 08/08/19 0500 08/09/19 0441  Weight: 90.1 kg 90.1 kg 95.3 kg    Examination:  General:  In bed on vent, minimal eye movement,breathing over set rate, no response to pain HENT: NCAT ETT in place PULM:lungs sound clear B today CV: RRR, no mgr GI: BS+, soft, nontender,  Dark green stool in rectal tube, no blood no obvious melena MSK: normal bulk and tone Neuro: sedated on vent   10/14 CXR images personally reviewed: EB blocker looks well positioned, ET tube good, CT in place. NO PTX  Resolved Hospital Problem list     Assessment & Plan:  Severe ARDS due to COVID 19 pneumonia: slowly improving oxygenation and ventilator mechanics Severe air leak now improved.  VTin matching VT out, though still seeing some airleak on chest tube (about every other breath, 1 chamber bubbles).  Remains on water seal.  PaO2/FIO2 136 today.  Continue full vent support per ARDS protocol: Continue mechanical ventilation per ARDS protocol Patient is breath stacking (does better on PC but still stacking).  Given attempt to heal BPF and improved ABG (resolved resp acidosis), start paralysis to try to maintain lower tidal volumes.   Hold off on bronchoscopy for now given above findings.   Now on PRVC with TV 24ml/kg, tolerating.  Target TVol 6-8cc/kgIBW Target Plateau Pressure < 30cm H20 Target driving pressure less than 15 cm of water Target PaO2  55-65: titrate PEEP/FiO2 per protocol As long as PaO2 to FiO2 ratio is less than 1:150 position in prone position for 16 hours a day - Holding off on proning given EB blocker, tenuous positioning.   Check CVP daily if CVL in place Target CVP less than 4, diurese as necessary Ventilator associated pneumonia prevention protocol  Tachycardia:  Afib with RVR Amio restarted today.   Pneumothorax on R with large air leak Plan is to let it heal over weekend (as above) If no improvement in air leak next week consider endobronchial valve placement  AKI:  worsening, now anuric (since 10/16) Continue CVVHD  Anticoagulation/DVT prevention in setting of thrombophilia from COVID 19 GIBleed, melena on rounds 10/16  GI consulted.  On PPI infusion and octreotide.  Increase PPI to bid Serial CBC Hold anticoagulation SCD's for DVT prophylaxis Plan for endoscopy today.   MSSA bacteremia: Septic shock 10/13> pseudomonas pneumonia? Other systemic infection? Fungal? Stopped vancomycin and anidulofungin 10/16 Continue cefepime Continue to wean off Levophed and vasopressin Levophed slowly weaning down.   Need for sedation/mechanical ventilation: severe agitation when weaning sedation RASS target -1 to -2 Oxycodone, seroquel to continue Continue fentanyl, versed infusion  FW 125q4 Hydrocortisone 50q 6 since 10/16 - will start titrating down.  Quetiapine BID flomax  Oxycodone Best practice:  Diet: tube feeding 0- held for procedure.   Pain/Anxiety/Delirium protocol (if indicated): as above VAP protocol (if indicated): yes DVT prophylaxis: as above GI prophylaxis: on ppi gtt Glucose control: SSI Mobility: bed rest Code Status: full Family Communication: will update his wife [name] again today Disposition: remain in ICU  Labs   CBC: Recent Labs  Lab 08/06/19 1750  08/07/19 0500  08/08/19 0600 08/08/19 1000 08/09/19 0329 08/09/19 0502 08/09/19 1255 08/09/19 1319  WBC 15.9*  --  12.1*  --  10.2  --   --  10.1  --  PENDING  NEUTROABS  --   --  11.3*  --  9.6*  --   --  9.2*  --   --   HGB 9.5*   < > 8.1*   < > 7.2* 7.1* 8.5* 7.9* 8.2* 8.3*  HCT 31.3*   < > 27.2*   < > 22.8* 21.0* 25.0* 24.6* 24.0* 25.7*  MCV 103.3*  --  103.4*  --  97.4  --   --  94.3  --  95.9  PLT 245  --  204  --  187  --   --  131*  --  128*   < > = values in this interval not displayed.    Basic Metabolic Panel: Recent Labs  Lab 08/07/19 0500  08/07/19 1605  08/08/19 0600  08/08/19 1554 08/08/19 1951 08/09/19 0329 08/09/19 0502 08/09/19 1255  08/09/19 1319  NA 146*   < > 145   < > 144  141   < > 142 144 143 144 142 141  K 5.1   < > 5.2*   < > 4.8  4.7   < > 4.7 4.7 4.7 5.0 4.8 5.2*  CL 103  --  106  --  107  105  --  108 110  --  109  --  109  CO2 27  --  25  --  25  26  --  23 22  --  22  --  21*  GLUCOSE 362*  --  220*  --  191*  197*  --  209* 186*  --  172*  --  194*  BUN 185*  --  162*  --  113*  88*  --  108* 99*  --  83*  --  94*  CREATININE 5.44*  --  4.81*  --  3.13*  3.36*  --  3.28* 2.86*  --  2.67*  --  2.97*  CALCIUM 7.1*  --  7.2*  --  7.7*  7.5*  --  7.7* 7.6*  --  7.7*  --  7.9*  MG 4.0*  --   --   --  2.9*  --   --   --   --  2.8*  --   --   PHOS 11.2*  --  8.1*  --  3.1  3.1  --  3.5  --   --  4.4  --   --    < > = values in this interval not displayed.   GFR: Estimated Creatinine Clearance: 28.3 mL/min (A) (by C-G formula based on SCr of 2.97 mg/dL (H)). Recent Labs  Lab 08/07/19 0500 08/08/19 0600 08/09/19 0502 08/09/19 1319  WBC 12.1* 10.2 10.1 PENDING    Liver Function Tests: Recent Labs  Lab 08/05/19 0550 08/06/19 0610 08/07/19 0500 08/07/19 1605 08/08/19 0600 08/08/19 1554 08/09/19 0502  AST 31 22 28   --  45*  --  39  ALT 19 9 6   --  5  --  5  ALKPHOS 104 45 65  --  93  --  91  BILITOT 0.6 1.0 0.3  --  0.5  --  0.5  PROT 6.8 5.3* 5.0*  --  5.7*  --  5.7*  ALBUMIN 3.5 3.2* 2.5* 2.7* 2.6*  2.8* 2.6* 2.4*   No results for input(s): LIPASE, AMYLASE in the last 168 hours. No results for input(s): AMMONIA in the last 168 hours.  ABG    Component Value Date/Time   PHART 7.416 08/09/2019 1255   PCO2ART 34.7 08/09/2019 1255   PO2ART 67.0 (L) 08/09/2019 1255   HCO3 22.4 08/09/2019 1255   TCO2 23 08/09/2019 1255   ACIDBASEDEF 2.0 08/09/2019 1255   O2SAT 94.0 08/09/2019 1255     Coagulation Profile: No results for input(s): INR, PROTIME in the last 168 hours.  Cardiac Enzymes: No results for input(s): CKTOTAL, CKMB, CKMBINDEX, TROPONINI in the last 168 hours.  HbA1C:  Hgb A1c MFr Bld  Date/Time Value Ref Range Status  07/26/2019 05:00 AM 6.1 (H) 4.8 - 5.6 % Final    Comment:    (NOTE) Pre diabetes:          5.7%-6.4% Diabetes:              >6.4% Glycemic control for   <7.0% adults with diabetes     CBG: Recent Labs  Lab 08/08/19 1951 08/08/19 2341 08/09/19 0434 08/09/19 0824 08/09/19 1253  GLUCAP 180* 187* 159* 198* 184*     Critical care time 55 minutes.

## 2019-08-09 NOTE — Plan of Care (Signed)
  Problem: Respiratory: Goal: Will maintain a patent airway Outcome: Progressing Goal: Complications related to the disease process, condition or treatment will be avoided or minimized Outcome: Progressing   Problem: Clinical Measurements: Goal: Ability to maintain clinical measurements within normal limits will improve Outcome: Progressing Goal: Diagnostic test results will improve Outcome: Progressing Goal: Respiratory complications will improve Outcome: Progressing   Problem: Nutrition: Goal: Adequate nutrition will be maintained Outcome: Progressing   Problem: Coping: Goal: Level of anxiety will decrease Outcome: Progressing   Problem: Elimination: Goal: Will not experience complications related to urinary retention Outcome: Progressing   Problem: Pain Managment: Goal: General experience of comfort will improve Outcome: Progressing   Problem: Safety: Goal: Ability to remain free from injury will improve Outcome: Progressing   Problem: Skin Integrity: Goal: Risk for impaired skin integrity will decrease Outcome: Progressing   Problem: Education: Goal: Knowledge of risk factors and measures for prevention of condition will improve Outcome: Not Progressing   Problem: Education: Goal: Knowledge of General Education information will improve Description: Including pain rating scale, medication(s)/side effects and non-pharmacologic comfort measures Outcome: Not Progressing   Problem: Health Behavior/Discharge Planning: Goal: Ability to manage health-related needs will improve Outcome: Not Progressing   Problem: Clinical Measurements: Goal: Will remain free from infection Outcome: Not Progressing Goal: Cardiovascular complication will be avoided Outcome: Not Progressing   Problem: Activity: Goal: Risk for activity intolerance will decrease Outcome: Not Progressing   Problem: Elimination: Goal: Will not experience complications related to bowel  motility Outcome: Not Progressing

## 2019-08-09 NOTE — Progress Notes (Addendum)
CHART ROUNDS--  I am covering for [facility] gastroenterology this weekend.  I have reviewed Dr. Encarnacion Slates note from yesterday, and the available notes and labs on the record as of this morning.  I spoke with the patient's nurse by telephone, who indicates that the patient has a rectal tube in place that has a small amount of dark drainage since it was emptied at 7:00 this morning.  No clinically overt active bleeding at the present time.  Rather, the patient has been having dark stool output over the past several days.  There has been a slight decline in hemoglobin overnight, from 8.5 following yesterday's transfusion to a current level of 7.9, although this may reflect equilibration since the patient's baseline prior to transfusion of 1 unit of packed cells was 7.1.  The patient is currently off his heparin, and is on IV pantoprazole and empiric octreotide, the latter because of a previous history of significant ethanol use.  No use of ulcerogenic medications during this hospitalization.  The patient's BUN is declining, although this is difficult to interpret since he is on dialysis.  The patient is requiring pressor support (less than before) but part of this may be related to atrial fibrillation with rapid ventricular response and septic shock.  The patient is mildly thrombocytopenic (131,000) but this may reflect consumption and/or result of sepsis rather than liver disease, since his baseline platelet count was normal.  There has not been an imaging study of his liver performed during this hospitalization although no mention of cirrhotic changes was made during his chest CT on October 2.  RECOMMENDATIONS:  1.  Had been contemplating obtaining a right upper quadrant ultrasound to look for evidence of occult liver disease, but spoke with Dr. Markus Daft of radiology, who indicates that the patient's recent chest CT visualized the majority of the liver and there was no evidence of any overt liver  disease, and the spleen size looked normal, so I do not think the ultrasound is needed.  2.  Add sucralfate suspension to the patient's regimen.  This will offer cytoprotection for stress gastritis, as well as adjunctive therapy for ulcer disease if present.  3.  On standby for endoscopic evaluation if felt to be needed by the ICU care team.  The yield would probably be fairly low for defining a point-source of bleeding that would be amenable to endoscopic intervention, but there is a moderate chance that we would get some idea of the patient's upper intestinal tract status for alternative causes of bleeding (for example, hemorrhagic gastritis) that would help guide timing of resumption of heparin.  However, given this patient's medical instability, Covid infection, and the logistical effort needed to bring staff and equipment over to the [facility] ICU to perform an endoscopy, I think endoscopic evaluation would be best reserved for several circumstances:  Acute, destabilizing hemorrhage; significant ongoing transfusion requirement or evidence of ongoing blood loss despite being off heparin; or recurrence of bleeding upon resumption of heparin.  4.  Agree with Dr. Encarnacion Slates recommendation yesterday to keep the patient off heparin for the time being, until the melena has cleared.  Please feel free to contact us at any time to discuss this patient's case in more detail.  Because various physicians are covering for our service this weekend, the best way to reach [place] is by calling our office number, [phone] so that the proper doctor on call can be paged.  Cleotis Nipper, M.D. Pager [phone] If no answer or after  5 PM call [phone]

## 2019-08-09 NOTE — Progress Notes (Signed)
EGD performed in OR at [facility]. Patient primary RN travelled with patient to OR and remained at bedside throughout.No endoscopy RN sedation given.

## 2019-08-09 NOTE — Progress Notes (Addendum)
Ionized calcium checks:   Post filter Systemic 0100        0.68     1.12 >increased citrate infusion by 36mL/hr to 270 per protocol >calcium gluconate infusion: No change per protocol  0300    0.67     1.09 >increased citrate infusion by 20 mL/hr to 290 >calcium gluconate infusion: No change   0500    0.64                 1.12 >increased citrate infusion by 20 mL/hr to 310 >calcium gluconate infusion: No change  0700     0.62      1.14 >increased citrate infusion by 20 mL/hr to 330 >calcium gluconate infusion: No change  Q4H 1100 1500 1900  Q8H going forward per protocol.

## 2019-08-09 NOTE — Progress Notes (Addendum)
0600 Mr. Mann's HR increasing over the last hour into the 150s-160s and occasionally into 170s.  Pressor demand increasing slightly from 5 to 9 mcg/min norepi after titrating down from 19 mcg/min over course of this shift.    E Link notified and new order for 2.5 mg metoprolol IVP for rate control and will let day team decide what to do from there.    CRRT clotting.  Blood returned and set changed.    HR 110-120s after 2.5 mg metoprolol IVP.   Will continue to monitor.

## 2019-08-09 NOTE — TOC Progression Note (Signed)
Transition of Care [facility]) - Progression Note    Patient Details  Name: [name] MRN: [id number] Date of Birth: [birth year]  Transition of Care [facility]) CM/SW Contact  Loletha Grayer Beverely Pace, RN Phone Number: [phone] (working remotely) 08/09/2019, 2:22 PM    Clinical Narrative:    Patient is a 68 y/o male admitted on 10/3 from the [facility] ED with ARDS from COVID pneumonia leading to need for mechanical ventilation. He remains on vent, sedated. Had endotrachial block done emergently on 10/15. Remains in critical condition. Case manager continues to monitor. Praying for his recovery.           Expected Discharge Plan and Services                                                 Social Determinants of Health (SDOH) Interventions    Readmission Risk Interventions No flowsheet data found.

## 2019-08-09 NOTE — Progress Notes (Signed)
Patient's case discussed with Dr. Dia Crawford.  We have decided to go ahead with endoscopic evaluation, primarily because of the desire to know if we could get the patient back on heparin.  I have discussed the nature, purpose, and risks of the procedure with the patient's wife, [name], by telephone and she is agreeable to proceed.  The patient's tube feeding has been held and so we will plan to proceed with his endoscopy later this afternoon.  Cleotis Nipper, M.D. Pager [phone] If no answer or after 5 PM call [phone]

## 2019-08-09 NOTE — Progress Notes (Signed)
Dr. Jonnie Finner paged for calcium citrate and calcium gluconate orders.  Returned call immediately. Awaiting orders and CRRT restart pending.

## 2019-08-09 NOTE — Progress Notes (Signed)
[facility] ROUNDING NOTE   Subjective:   This is a 68 year old gentleman who was admitted with Covid pneumonia July 13, 2019 completed course of remdesivir convalescent plasma Decadron treated with vancomycin and cefepime from 08/01/2019 until 08/02/2019.  On 08/02/2019 patient developed an ileus.  His creatinine on 07/25/2019 was 1.5 which appears to be his baseline.  On 08/02/2019 started to increase.  On 08/02/2019 was found to have MSSA bacteremia and was started on Ancef.  His blood pressure has been marginal with drops in blood pressure to 70 mmHg.  His renal ultrasound showed no hydronephrosis on 08/07/2019. Pt had administration of intravenous contrast on 07/25/2019.  Blood pressure labile to low, FiO2 50%, peep 10, appears to be getting still two pressors.   Nimbex started today due to concerns about worsening PTX per RN. IV amio started last night for afib /RVR.  Pt had EGD today w/ epi Rx of an ulcer.   Oliguric renal failure.  Continues to be kept even on CRRT with no fluid off.   Today has clotted filters x 4 in less than 24 hrs.    Objective:  Vital signs in last 24 hours:  Temp:  [97 F (36.1 C)-98.2 F (36.8 C)] 98.2 F (36.8 C) (10/17 1630) Pulse Rate:  [30-145] 76 (10/17 2000) Resp:  [18-32] 30 (10/17 2000) BP: (83-140)/(52-115) 107/57 (10/17 1430) SpO2:  [92 %-100 %] 98 % (10/17 2000) Arterial Line BP: (89-155)/(42-73) 101/60 (10/17 1830) FiO2 (%):  [50 %-60 %] 60 % (10/17 1945) Weight:  [95.3 kg] 95.3 kg (10/17 0441)  Exam:   Patient not examined directly given COVID-19 + status, utilizing exam of the primary team and observations of RN's.   Assessment/ Plan:   Acute kidney injury with a baseline of chronic kidney disease with baseline serum creatinine about 1.5 mg/dL.  Admitted with Covid pneumonia complicated course with recent history of MSSA bacteremia.  Developed shock hypotension and required pressors.  No evidence of hydronephrosis on  renal ultrasound.  This suggests ATN.  Could not identify any additional nephrotoxins.  He did receive vancomycin for 1 day.  There was administration of contrast but this was early on the course of his illness on 07/25/2019. He has been developing progressive metabolic acidosis and we initiated CRRT 08/07/2019  Today due to recurrent filter clotting and inability to use hep w/ crrt will start on citrate local a/c protocol   Continue to keep even as tol  Metabolic acidosis this appears to be resolved.  Discontinued IV bicarbonate  Anemia -as per primary team transfuse as necessary no transfusion requirements at this time  COVID-19 pneumonia-  Treated with remdesivir, dexamethasone and convalescent plasma.  Shock - remains on pressors  MSSA bacteremia vancomycin and anidulafungin have been discontinued.  Continues on cefepime   LOS: 15 Sandy Salaam Dezmen Alcock @TODAY @8 :11 PM   Basic Metabolic Panel: Recent Labs  Lab 08/07/19 0500  08/07/19 1605  08/08/19 0600  08/08/19 1554 08/08/19 1951  08/09/19 0502 08/09/19 1255 08/09/19 1319 08/09/19 1430 08/09/19 1758  NA 146*   < > 145   < > 144  141   < > 142 144   < > 144 142 141 141 144  K 5.1   < > 5.2*   < > 4.8  4.7   < > 4.7 4.7   < > 5.0 4.8 5.2* 5.0 4.5  CL 103  --  106  --  107  105  --  108 110  --  109  --  109  --  117*  CO2 27  --  25  --  25  26  --  23 22  --  22  --  21*  --  18*  GLUCOSE 362*  --  220*  --  191*  197*  --  209* 186*  --  172*  --  194*  --  139*  BUN 185*  --  162*  --  113*  88*  --  108* 99*  --  83*  --  94*  --  87*  CREATININE 5.44*  --  4.81*  --  3.13*  3.36*  --  3.28* 2.86*  --  2.67*  --  2.97*  --  2.65*  CALCIUM 7.1*  --  7.2*  --  7.7*  7.5*  --  7.7* 7.6*  --  7.7*  --  7.9*  --  6.4*  MG 4.0*  --   --   --  2.9*  --   --   --   --  2.8*  --   --   --   --   PHOS 11.2*  --  8.1*  --  3.1  3.1  --  3.5  --   --  4.4  --   --   --  5.8*   < > = values in this interval not displayed.     Liver Function Tests: Recent Labs  Lab 08/05/19 0550 08/06/19 0610 08/07/19 0500 08/07/19 1605 08/08/19 0600 08/08/19 1554 08/09/19 0502 08/09/19 1758  AST 31 22 28   --  45*  --  39  --   ALT 19 9 6   --  5  --  5  --   ALKPHOS 104 45 65  --  93  --  91  --   BILITOT 0.6 1.0 0.3  --  0.5  --  0.5  --   PROT 6.8 5.3* 5.0*  --  5.7*  --  5.7*  --   ALBUMIN 3.5 3.2* 2.5* 2.7* 2.6*  2.8* 2.6* 2.4* 1.9*   No results for input(s): LIPASE, AMYLASE in the last 168 hours. No results for input(s): AMMONIA in the last 168 hours.  CBC: Recent Labs  Lab 08/06/19 1750  08/07/19 0500  08/08/19 0600  08/09/19 0329 08/09/19 0502 08/09/19 1255 08/09/19 1319 08/09/19 1430  WBC 15.9*  --  12.1*  --  10.2  --   --  10.1  --  10.0  --   NEUTROABS  --   --  11.3*  --  9.6*  --   --  9.2*  --   --   --   HGB 9.5*   < > 8.1*   < > 7.2*   < > 8.5* 7.9* 8.2* 8.3* 7.8*  HCT 31.3*   < > 27.2*   < > 22.8*   < > 25.0* 24.6* 24.0* 25.7* 23.0*  MCV 103.3*  --  103.4*  --  97.4  --   --  94.3  --  95.9  --   PLT 245  --  204  --  187  --   --  131*  --  128*  --    < > = values in this interval not displayed.    Cardiac Enzymes: No results for input(s): CKTOTAL, CKMB, CKMBINDEX, TROPONINI in the last 168 hours.  BNP: Invalid input(s): POCBNP  CBG: Recent Labs  Lab 08/08/19 2341 08/09/19  7408 08/09/19 0824 08/09/19 1253 08/09/19 1950  GLUCAP 187* 159* 198* 184* 136*    Microbiology: Results for orders placed or performed during the hospital encounter of 07/25/19  Blood Culture (routine x 2)     Status: None   Collection Time: 07/25/19  4:56 PM   Specimen: Left Antecubital; Blood  Result Value Ref Range Status   Specimen Description LEFT ANTECUBITAL  Final   Special Requests   Final    BOTTLES DRAWN AEROBIC AND ANAEROBIC Blood Culture results may not be optimal due to an excessive volume of blood received in culture bottles   Culture   Final    NO GROWTH 5 DAYS Performed at  [facility], [address] [place], [place] [address]    Report Status 07/30/2019 FINAL  Final  Blood Culture (routine x 2)     Status: None   Collection Time: 07/25/19  5:01 PM   Specimen: BLOOD RIGHT HAND  Result Value Ref Range Status   Specimen Description BLOOD RIGHT HAND  Final   Special Requests   Final    BOTTLES DRAWN AEROBIC AND ANAEROBIC Blood Culture adequate volume   Culture   Final    NO GROWTH 5 DAYS Performed at [facility], [address], [place], [place] [address]    Report Status 07/30/2019 FINAL  Final  MRSA PCR Screening     Status: None   Collection Time: 07/26/19  2:09 AM   Specimen: Nasal Mucosa; Nasopharyngeal  Result Value Ref Range Status   MRSA by PCR NEGATIVE NEGATIVE Final    Comment:        The GeneXpert MRSA Assay (FDA approved for NASAL specimens only), is one component of a comprehensive MRSA colonization surveillance program. It is not intended to diagnose MRSA infection nor to guide or monitor treatment for MRSA infections. Performed at [facility], [place] [address] [place], [place] [address]   Culture, blood (Routine X 2) w Reflex to ID Panel     Status: Abnormal   Collection Time: 08/01/19 12:15 PM   Specimen: BLOOD  Result Value Ref Range Status   Specimen Description   Final    BLOOD RIGHT ARM Performed at [facility] [address] [place], [place] [address]    Special Requests   Final    BOTTLES DRAWN AEROBIC AND ANAEROBIC Blood Culture adequate volume Performed at [facility] [address], [place], [place] [address]    Culture  Setup Time   Final    GRAM POSITIVE COCCI IN CLUSTERS IN BOTH AEROBIC AND ANAEROBIC BOTTLES CRITICAL RESULT CALLED TO, READ BACK BY AND VERIFIED WITH: C. SHADE PHARMD, AT 7741 08/02/19 BY Rush Landmark Performed at [facility], [place] [address], [place], [place] [address]    Culture STAPHYLOCOCCUS AUREUS (A)  Final   Report Status  08/04/2019 FINAL  Final   Organism ID, Bacteria STAPHYLOCOCCUS AUREUS  Final      Susceptibility   Staphylococcus aureus - MIC*    CIPROFLOXACIN <=0.5 SENSITIVE Sensitive     ERYTHROMYCIN <=0.25 SENSITIVE Sensitive     GENTAMICIN <=0.5 SENSITIVE Sensitive     OXACILLIN 0.5 SENSITIVE Sensitive     TETRACYCLINE <=1 SENSITIVE Sensitive     VANCOMYCIN 1 SENSITIVE Sensitive     TRIMETH/SULFA <=10 SENSITIVE Sensitive     CLINDAMYCIN <=0.25 SENSITIVE Sensitive     RIFAMPIN <=0.5 SENSITIVE Sensitive     Inducible Clindamycin NEGATIVE Sensitive     *  STAPHYLOCOCCUS AUREUS  Blood Culture ID Panel (Reflexed)     Status: Abnormal   Collection Time: 08/01/19 12:15 PM  Result Value Ref Range Status   Enterococcus species NOT DETECTED NOT DETECTED Final   Listeria monocytogenes NOT DETECTED NOT DETECTED Final   Staphylococcus species DETECTED (A) NOT DETECTED Final    Comment: CRITICAL RESULT CALLED TO, READ BACK BY AND VERIFIED WITH: C. SHADE PHARMD, AT 0814 08/02/19 BY D. VANHOOK    Staphylococcus aureus (BCID) DETECTED (A) NOT DETECTED Final    Comment: Methicillin (oxacillin) susceptible Staphylococcus aureus (MSSA). Preferred therapy is anti staphylococcal beta lactam antibiotic (Cefazolin or Nafcillin), unless clinically contraindicated. CRITICAL RESULT CALLED TO, READ BACK BY AND VERIFIED WITH: C. SHADE PHARMD, AT 4818 08/02/19 BY D. VANHOOK    Methicillin resistance NOT DETECTED NOT DETECTED Final   Streptococcus species NOT DETECTED NOT DETECTED Final   Streptococcus agalactiae NOT DETECTED NOT DETECTED Final   Streptococcus pneumoniae NOT DETECTED NOT DETECTED Final   Streptococcus pyogenes NOT DETECTED NOT DETECTED Final   Acinetobacter baumannii NOT DETECTED NOT DETECTED Final   Enterobacteriaceae species NOT DETECTED NOT DETECTED Final   Enterobacter cloacae complex NOT DETECTED NOT DETECTED Final   Escherichia coli NOT DETECTED NOT DETECTED Final   Klebsiella oxytoca NOT DETECTED  NOT DETECTED Final   Klebsiella pneumoniae NOT DETECTED NOT DETECTED Final   Proteus species NOT DETECTED NOT DETECTED Final   Serratia marcescens NOT DETECTED NOT DETECTED Final   Haemophilus influenzae NOT DETECTED NOT DETECTED Final   Neisseria meningitidis NOT DETECTED NOT DETECTED Final   Pseudomonas aeruginosa NOT DETECTED NOT DETECTED Final   Candida albicans NOT DETECTED NOT DETECTED Final   Candida glabrata NOT DETECTED NOT DETECTED Final   Candida krusei NOT DETECTED NOT DETECTED Final   Candida parapsilosis NOT DETECTED NOT DETECTED Final   Candida tropicalis NOT DETECTED NOT DETECTED Final    Comment: Performed at [facility], [place] [address], [place], [place] [address]  Culture, blood (Routine X 2) w Reflex to ID Panel     Status: Abnormal   Collection Time: 08/01/19 12:21 PM   Specimen: BLOOD  Result Value Ref Range Status   Specimen Description   Final    BLOOD RIGHT HAND Performed at [facility] [address], [place], [place] [address]    Special Requests   Final    BOTTLES DRAWN AEROBIC AND ANAEROBIC Blood Culture adequate volume Performed at [facility] [address] [place], [place] [address]    Culture  Setup Time   Final    GRAM POSITIVE COCCI IN CLUSTERS IN BOTH AEROBIC AND ANAEROBIC BOTTLES CRITICAL RESULT CALLED TO, READ BACK BY AND VERIFIED WITH: C. SHADE PHARMD, AT 8502 08/02/19 BY D. VANHOOK    Culture (A)  Final    STAPHYLOCOCCUS AUREUS SUSCEPTIBILITIES PERFORMED ON PREVIOUS CULTURE WITHIN THE LAST 5 DAYS. Performed at [facility], [place] [address], [place], [place] [address]    Report Status 08/04/2019 FINAL  Final  Culture, respiratory     Status: None   Collection Time: 08/01/19  3:30 PM   Specimen: Tracheal Aspirate  Result Value Ref Range Status   Specimen Description   Final    TRACHEAL ASPIRATE Performed at [facility] [address] [place], [place] [address]     Special Requests   Final    NONE Performed at [facility], [place] [address] [place], [place] [address]  Gram Stain   Final    RARE WBC PRESENT, PREDOMINANTLY PMN ABUNDANT GRAM POSITIVE COCCI IN CLUSTERS Performed at [facility], [place] [address], [place], [place] [address]    Culture   Final    MODERATE STAPHYLOCOCCUS AUREUS RARE PSEUDOMONAS AERUGINOSA    Report Status 08/06/2019 FINAL  Final   Organism ID, Bacteria STAPHYLOCOCCUS AUREUS  Final   Organism ID, Bacteria PSEUDOMONAS AERUGINOSA  Final      Susceptibility   Pseudomonas aeruginosa - MIC*    CEFTAZIDIME 4 SENSITIVE Sensitive     CIPROFLOXACIN 0.5 SENSITIVE Sensitive     GENTAMICIN <=1 SENSITIVE Sensitive     IMIPENEM 1 SENSITIVE Sensitive     PIP/TAZO 16 SENSITIVE Sensitive     CEFEPIME 4 SENSITIVE Sensitive     * RARE PSEUDOMONAS AERUGINOSA   Staphylococcus aureus - MIC*    CIPROFLOXACIN <=0.5 SENSITIVE Sensitive     ERYTHROMYCIN <=0.25 SENSITIVE Sensitive     GENTAMICIN <=0.5 SENSITIVE Sensitive     OXACILLIN 0.5 SENSITIVE Sensitive     TETRACYCLINE <=1 SENSITIVE Sensitive     VANCOMYCIN <=0.5 SENSITIVE Sensitive     TRIMETH/SULFA <=10 SENSITIVE Sensitive     CLINDAMYCIN <=0.25 SENSITIVE Sensitive     RIFAMPIN <=0.5 SENSITIVE Sensitive     Inducible Clindamycin NEGATIVE Sensitive     * MODERATE STAPHYLOCOCCUS AUREUS  Culture, Urine     Status: Abnormal   Collection Time: 08/01/19  5:45 PM   Specimen: Urine, Catheterized  Result Value Ref Range Status   Specimen Description   Final    URINE, CATHETERIZED Performed at [facility] [address], [place], [place] [address]    Special Requests   Final    NONE Performed at [facility], [place] [address], [place], [place] [address]    Culture (A)  Final    <10,000 COLONIES/mL INSIGNIFICANT GROWTH Performed at [facility] [address] [place], [place] [address]    Report Status  08/03/2019 FINAL  Final  MRSA PCR Screening     Status: None   Collection Time: 08/02/19 10:02 AM   Specimen: Nasal Mucosa; Nasopharyngeal  Result Value Ref Range Status   MRSA by PCR NEGATIVE NEGATIVE Final    Comment:        The GeneXpert MRSA Assay (FDA approved for NASAL specimens only), is one component of a comprehensive MRSA colonization surveillance program. It is not intended to diagnose MRSA infection nor to guide or monitor treatment for MRSA infections. Performed at [facility], [place] [address], [place], [place] [address]   Culture, blood (routine x 2)     Status: None (Preliminary result)   Collection Time: 08/05/19  5:00 AM   Specimen: BLOOD RIGHT HAND  Result Value Ref Range Status   Specimen Description   Final    BLOOD RIGHT HAND Performed at [facility] [address], [place], [place] [address]    Special Requests   Final    BOTTLES DRAWN AEROBIC ONLY Blood Culture adequate volume Performed at [facility] [address], [place], [place] [address]    Culture   Final    NO GROWTH 4 DAYS Performed at [facility], [place] [address], [place], [place] [address]    Report Status PENDING  Incomplete  Culture, blood (routine x 2)     Status: None (Preliminary result)   Collection Time: 08/05/19  5:05 AM  Specimen: BLOOD LEFT HAND  Result Value Ref Range Status   Specimen Description   Final    BLOOD LEFT HAND Performed at [facility] [address], [place], [place] [address]    Special Requests   Final    BOTTLES DRAWN AEROBIC ONLY Blood Culture adequate volume Performed at [facility] [address], [place], [place] [address]    Culture   Final    NO GROWTH 4 DAYS Performed at [facility], [place] [address], [place], [place] [address]    Report Status PENDING  Incomplete    Coagulation Studies: No results for input(s): LABPROT, INR in the  last 72 hours.  Urinalysis: No results for input(s): COLORURINE, LABSPEC, PHURINE, GLUCOSEU, HGBUR, BILIRUBINUR, KETONESUR, PROTEINUR, UROBILINOGEN, NITRITE, LEUKOCYTESUR in the last 72 hours.  Invalid input(s): APPERANCEUR    Imaging: Dg Abd 1 View  Result Date: 08/09/2019 CLINICAL DATA:  ARDS from COVID-19. EXAM: ABDOMEN - 1 VIEW COMPARISON:  August 06, 2019 FINDINGS: No free air, portal venous gas, or pneumatosis identified on supine imaging. An NG tube terminates in the stomach. Foreign body projects over the midline of the lower pelvis. No other abnormalities. IMPRESSION: 1. The foreign body projecting over the pelvis may be on or in the patient. Recommend clinical correlation. 2. No other acute abnormalities. Electronically Signed   By: Dorise Bullion III M.D   On: 08/09/2019 18:50   Dg Chest Port 1 View  Result Date: 08/09/2019 CLINICAL DATA:  Right-sided pneumothorax. Acute respiratory failure. COVID-19. EXAM: PORTABLE CHEST 1 VIEW COMPARISON:  08/08/2019; 08/07/2019; chest CT-07/25/2019 FINDINGS: Grossly unchanged cardiac silhouette and mediastinal contours. Stable positioning of support apparatus with endotracheal tube overlying tracheal air column with tip superior to the carina, left jugular approach central venous catheter tips projected over the superior SVC, right subclavian approach central venous catheter tip projects over the superior caval junction, enteric tube tip and side port projected over the gastric antrum and right-sided chest tube and selective balloon occlusion catheter involving the right mainstem bronchus. No pneumothorax. Redemonstrated extensive bilateral slightly nodular airspace opacities. Left basilar/retrocardiac consolidative opacities are unchanged. No new focal airspace opacities. No definite pleural effusion. No acute osseous abnormalities. IMPRESSION: 1.  Stable positioning of support apparatus.  No pneumothorax. 2. No significant change in extensive  bilateral airspace opacities compatible with provided history of COVID-19 infection. Electronically Signed   By: Sandi Mariscal M.D.   On: 08/09/2019 05:18   Dg Chest Port 1 View  Result Date: 08/08/2019 CLINICAL DATA:  Pulmonary infiltrates. Acute respiratory failure with hypoxemia. COVID-19. EXAM: PORTABLE CHEST 1 VIEW COMPARISON:  08/07/2019 and 08/06/2019 FINDINGS: Endotracheal tube in good position 4.7 cm above the carina. Uniblocker tube tip is in the right lower lobe. Left jugular vein catheter tip is in the SVC at the level of the carina. Right subclavian catheter tip is in the right atrium. Right-sided chest tube at the base, unchanged. NG tube tip below the diaphragm. There is improved aeration at the right lung base and right midzone. No change in the infiltrates at the left base and left midzone. Heart size and vascularity are normal. IMPRESSION: 1. Improved aeration at the right lung base. No change in the infiltrates at the left base and left midzone. No residual pneumothorax. 2. Tubes and lines appear essentially unchanged. Electronically Signed   By: Lorriane Shire M.D.   On: 08/08/2019 16:27     Medications:   .  prismasol BGK 4/2.5 500  mL/hr at 08/08/19 1327  .  prismasol BGK 4/2.5 500 mL/hr at 08/09/19 0158  . sodium chloride Stopped (08/01/19 0957)  . sodium chloride 10 mL/hr at 08/09/19 1000  . amiodarone 30 mg/hr (08/09/19 1854)  . ceFEPime (MAXIPIME) IV 2 g (08/09/19 1009)  . cisatracurium (NIMBEX) infusion 2 mcg/kg/min (08/09/19 1921)  . epinephrine    . feeding supplement (PIVOT 1.5 CAL) 1,000 mL (08/09/19 1718)  . fentaNYL infusion INTRAVENOUS 100 mcg/hr (08/09/19 1814)  . midazolam 1 mg/hr (08/09/19 0700)  . norepinephrine (LEVOPHED) Adult infusion 7 mcg/min (08/09/19 1024)  . octreotide  (SANDOSTATIN)    IV infusion 50 mcg/hr (08/09/19 1000)  . pantoprozole (PROTONIX) infusion 8 mg/hr (08/09/19 1022)  . prismasol BGK 4/2.5 2,000 mL/hr at 08/09/19 0631  . vasopressin  (PITRESSIN) infusion - *FOR SHOCK* 0.03 Units/min (08/09/19 1000)   . sodium chloride   Intravenous Once  . artificial tears  1 application Both Eyes F1T  . chlorhexidine  15 mL Mouth/Throat BID  . Chlorhexidine Gluconate Cloth  6 each Topical Daily  . feeding supplement (PRO-STAT SUGAR FREE 64)  60 mL Per Tube TID  . free water  125 mL Per Tube Q4H  . hydrocortisone sodium succinate  50 mg Intravenous Q6H  . insulin aspart  0-9 Units Subcutaneous Q4H  . magnesium citrate  0.5 Bottle Per Tube Once  . mouth rinse  15 mL Mouth Rinse 10 times per day  . oxyCODONE  5 mg Oral Q6H  . polyethylene glycol  17 g Per Tube BID  . QUEtiapine  25 mg Oral BID  . sennosides  5 mL Per Tube BID  . sucralfate  1 g Oral Q6H  . tamsulosin  0.4 mg Oral BID   Place/Maintain arterial line **AND** sodium chloride, Place/Maintain arterial line **AND** sodium chloride, acetaminophen (TYLENOL) oral liquid 160 mg/5 mL, acetaminophen, fentaNYL, heparin, influenza vaccine adjuvanted, midazolam, pneumococcal 23 valent vaccine, sodium chloride

## 2019-08-09 NOTE — Op Note (Signed)
[facility] Patient Name: [name] Procedure Date: 08/09/2019 MRN: [id number] Attending MD: Ronald Lobo , MD Date of Birth: 1951/01/15 CSN: [id number] Age: 68 Admit Type: Inpatient Procedure:                Upper GI endoscopy Indications:              Acute post hemorrhagic anemia, Melena in a patient                            with Covid-associated respiratory failure on heparin Providers:                Ronald Lobo, MD, Carlyn Reichert, RN, William Dalton, Technician Referring MD:              Medicines:                None--the patient was already sedated on the                            ventilator per ICU care. This procedure was done in                            the OR at the [facility]).                            PHOTOGRAPHS NOT AVAILABLE due to equipment                            limitations at [place]. Complications:            No immediate complications. Estimated Blood Loss:     Estimated blood loss was minimal. Procedure:                After obtaining informed consent from the patient's                            wife by telephone, the endoscope was passed under                            direct vision alongside the patient's orogastric                            tube. Throughout the procedure, the patient's blood                            pressure, pulse, and oxygen saturations were                            monitored continuously. The GIF-H190 ([id number])                            Olympus gastroscope was introduced through the  mouth, and advanced to the second part of duodenum.                            The upper GI endoscopy was accomplished without                            difficulty. The patient tolerated the procedure                            well. Scope In: Scope Out: Findings:      The examined esophagus was normal.      Excessive  fluid consistent with residual tube feeding was found in the       gastric fundus. Fluid aspiration was performed through the scope suction       channel. The amount of fluid collected was 700 mL. The fluid was opaque.      One non-bleeding cratered gastric ulcer with pigmented material was       found at the incisura. The lesion was 15 mm in largest dimension. Area       was successfully injected with 4 mL of a 1:10,000 solution of       epinephrine for hemostasis. For hemostasis, three hemostatic clips were       successfully placed. There was no bleeding during the maneuver.      The exam of the stomach was otherwise normal.      The cardia and gastric fundus were normal on retroflexion.      The examined duodenum was normal. Impression:               - No active bleeding or blood or coffee ground                            material in the upper tract at the time of this                            exam.                           - Normal esophagus.                           - Excessive gastric fluid (residual tube feeding)                            without anatomic gastric outlet obstruction. Fluid                            aspiration performed. The stomach was somewhat                            dilated and patulous, causing looping of the                            endoscope along the greater curve during                            advancement,  making it difficult to traverse the                            pylorus, although this was eventually successfully                            accomplished.                           - Non-bleeding gastric ulcer with pigmented                            material suggesting stigma of recent hemorrhage.                            Injected with good blanching. Clips were placed,                            effectively sealing off the ulcer.                           - Normal examined duodenum. Moderate Sedation:      not  administered. Recommendation:           - Observe patient's clinical course following                            today's procedure with therapeutic intervention.                           - Continue present medications, except it would be                            ok to stop octreotide since there was no evidence                            of portal hypertension.                           - May resume heparin tomorrow if stable.                           - Check H. pylori serology (no biopsies performed                            during today's procedure) and consider eventual                            treatment if positive.                           - Consider post-pyloric feedings, or frequent                            checking of gastric residuals w/ slower  intra-gastric feeding rate, in view of large                            gastric residual noted. Procedure Code(s):        --- Professional ---                           [id number], Esophagogastroduodenoscopy, flexible,                            transoral; with control of bleeding, any method Diagnosis Code(s):        --- Professional ---                           K25.9, Gastric ulcer, unspecified as acute or                            chronic, without hemorrhage or perforation                           D62, Acute posthemorrhagic anemia                           K92.1, Melena (includes Hematochezia) CPT copyright 2019 [employer]. All rights reserved. The codes documented in this report are preliminary and upon coder review may  be revised to meet current compliance requirements. Ronald Lobo, MD 08/09/2019 8:39:02 PM This report has been signed electronically. Number of Addenda: 0

## 2019-08-09 NOTE — Progress Notes (Addendum)
BIS  2000 -  43 2100 -  50 2200 -  51  2300 -  56 0000 -  45 0100 -  47 0200 -  45 0300 -  41 0400 -  43 0500 -  40 0600 -  38 0700 -  41

## 2019-08-09 NOTE — Progress Notes (Signed)
eLink Physician-Brief Progress Note Patient Name: [name] DOB: [birth year] MRN: [id number]   Date of Service  08/09/2019  HPI/Events of Note  AFIB with RVR HR = 148. BP = 121/55. Nursing states that rounding team had to D/C anticoagulation d/t GI bleeding and are reluctant to convert him with Amiodarone. K+ = 5.0 and Mg++ = 2.8.  eICU Interventions  Will order: 1. Metoprolol 2.5 mg IV X 1 now.      Intervention Category Major Interventions: Arrhythmia - evaluation and management  Sommer,Steven Eugene 08/09/2019, 6:36 AM

## 2019-08-09 NOTE — Progress Notes (Addendum)
PROGRESS NOTE    [name]  GYK:[id number] DOB: 1951-03-02 DOA: 07/25/2019 PCP: Kathyrn Drown, MD   Brief Narrative:  68 year old BM PMHx GERD, BPH   Presented to [facility] ER with shortness of breath and hypoxia.  Patient was diagnosed with COVID 19 on July 13 9019.  Patient's wife also has COVID-19 but is asymptomatic.  Over the last 1 to 2 days, the patient's shortness of breath is increased.  Patient has been suffering with anorexia.  Patient's wife was actually having a conversation via teleconference with the patient's PCP.  PCP is also otherwise physician.  He had actually called to check on how the wife was doing.  The wife had mentioned to the physician that the patient has been laying in bed.  When the physician listened to how the patient was speaking over the phone, it was noted the patient was tachypneic.  EMS was activated.  When EMS arrived, the patient's O2 saturations were apparently in the 40 percentile range.  Patient was placed on nonrebreather.  He was transferred to the ER.  In the ER, his oxygen saturations on 100% nonrebreather were only in the 80s.  Patient was confused.  Patient was emergently intubated.  Patient given IV steroids.  Initial chest x-ray demonstrates diffuse multifocal infiltrates.  CT scan of the chest was negative for PE.  ABG prior to leaving for [place] demonstrates a pH 7.41 PCO2 44 PO2 of 87 on FiO2 100%.  AA gradient calculates out to 571.  I personally called the patient's wife on the evening of July 25, 2019.  During that conversation we had discussed using IV steroids, IV remdesivir.  We also discussed off label use of Actemra and possibly confluence of plasma due to the patient's severe COVID-19 pneumonia.  She verbally agreed that the medical team should try anything possible to make the patient better.  Confirm with the wife the patient is a full code.    ED Course: emergently intubated. Central line placed. Started on  IV Decadron.    Subjective: 10/17 intubated/sedated.  Last 24 hours afebrile.  Appears increased air leak overnight from RIGHT pneumothorax   Assessment & Plan:   Principal Problem:   Pneumonia due to COVID-19 virus Active Problems:   BPH (benign prostatic hyperplasia)   Acute respiratory failure due to COVID-19 (HCC)   AKI (acute kidney injury) (HCC)   Fluid overload   Pneumothorax on right   Acute renal failure (ARF) (HCC)   GI bleed   Atrial fibrillation with RVR (HCC)   Constipation  COVID pneumonia/acute respiratory stress with hypoxia/bacterial superinfection - Continue Decadron - Continue Remdesivir per pharmacy protocol - 10/2 Actemra x1 dose - 10/3 counseled patient wife on CCP over phone and she has agreed to allow [place] to administer CCP.  Paperwork in chart  -10/3 transfuse 1 unit CCP - Continue prone patient> 16 hours/day if patient tolerates.  If not prone patient for 2 to 3 hours every shift. -10/3 patient enrolled in [facility]  Lab 08/05/19 0550 08/06/19 0610 08/07/19 0500 08/08/19 0600 08/09/19 0502  CRP <0.8 4.6* 16.0* 10.4* 5.4*   Recent Labs  Lab 08/05/19 0550 08/06/19 0610 08/07/19 0500 08/08/19 0600 08/09/19 0502  DDIMER 6.58* 2.98* 4.74* 3.84* 4.64*  - Titrate patient's vent settings to maintain SPO2 88 to 93%, if possible.  Will use ARDS lung protective protocol (6 to 8 cc/kg/min). - Attempt to maintain plateau pressure<30 - Attempt to maintain driving  pressure 15 - 10/5 PF ratio= 170 while prone - 10/6 transfuse 1 unit CCP (this is patient's second unit) -10/10 MSSA bacteremia.  Antibiotics initiated per ID recommendations -10/13 PCXR large RIGHT sided pneumothorax; s/p chest tube placement with reinflation of lung.  High-volume leak (questionable minimal leak at insertion site improved with Vaseline gauze) -10/14 continue broad-spectrum antibiotic and antifungal added -10/14 stress dose steroids added -10/15 s/p  bronchoscopy with placement of endobronchial balloon right intermedius bronchi.  Patient now with minimal air leak -10/16 s/p repeat bronchoscopy to adjust endobronchial balloon.  Airleak now again resealed.  Still minimal air leak. -10/17 although air leak appears to have worsened by observation of Pleur-evac, clinically patient stable.  This is most likely secondary to patient trying to initiate large breaths.  Will paralyze patient with Nimbex.  NOTE after paralyzation with Nimbex decreased air leak, this will allow patient's lung to hopefully heal without further intervention  A. fib RVR -Had talked about cardioverting patient but given right pneumothorax with leak and endobronchial balloon in place would not chance procedure. 10/17 restart amiodarone drip  Septic shock/Hypotension -Continued Norepinephrine and Vasopressin drip to maintain MAP>65  GI bleed  Recent Labs  Lab 08/09/19 0329 08/09/19 0502 08/09/19 1255 08/09/19 1319 08/09/19 1430  HGB 8.5* 7.9* 8.2* 8.3* 7.8*  -Hemoglobin trending down over last 24 hours. -Occult blood pending -10/14 Heparin SQ (hold).   -10/16 GI bleed continuing off Heparin for 48 hrs. . -10/16 Transfuse 1 unit PRBC -Increase Protonix BID---> Protonix gtt -10/16 start Octreotide gtt  -BMP q 8 hr per GI -Discussed case with Dr. Gerilyn Nestle GI.  See her note from 10/16 -10/16 Dr. Roselie Awkward spoke with Dr. Joylene Igo GI and they agreed that if patient continues to bleed will bring equipment to [facility] to perform any required procedure. -10/17 s/p EGD per Dr. Joannie Springs GI large gastric ulcer with vessel visible.  Will await official report and recommendations  Gastric ulcer - See GI bleed  Pneumoperitoneum? -Per Dr. Lake Bells radiology contacted him believes has pneumoperitoneum -Negative pneumoperitoneum on lateral decubitus x-ray.  Constipation (patient with large stool burden) -Per GI significant residual and stomach -  MiraLAX - Senokot - Mag citrate 1/2 bottle x1  Fluid overload - Strict in and out +6.8 L -Daily weight Filed Weights   08/07/19 0320 08/08/19 0500 08/09/19 0441  Weight: 90.1 kg 90.1 kg 95.3 kg  - CVP daily -10/17 CVP=6  Acute renal failure - Avoid nephrotoxic medication Recent Labs  Lab 08/08/19 0600 08/08/19 1554 08/08/19 1951 08/09/19 0502 08/09/19 1319  CREATININE 3.13*   3.36* 3.28* 2.86* 2.67* 2.97*  -10/14 renal ultrasound pending -SPEP, UPEP, complements, c-ANCA, urine electrolytes, urine osmolality, serum osmolality, renal ultrasound pending - Spoke with Dr. Justin Mend nephrology will see patient first thing in the a.m. -10/15 Per nephrology begin CRRT  Metabolic acidosis -53/66 DC sodium bicarb drip start CRRT  BPH -Flomax 0.4 mg BID    Goals of care - CCP use; counseled Mrs. Holberg that CCP is not improving therapy for COVID-19 though available data shows it may have benefit with high titer plasma is given.  Is unclear if it is effective other patients and with use of low titer plasma.  Risk and benefits of its use were explained to Mrs. Roh and she agreed to treatment..  All appropriate IRB consent paperwork has been signed and is in patient's chart.   DVT prophylaxis: Lovenox (ICU dose) Code Status: Full Family Communication: 10/16  Dr. Marchelle Gearing spoke with family.   Disposition Plan: TBD   Consultants:  PCCM   Procedures/Significant Events:  10/2 PCXR;-Endotracheal tube remains with tip above the thoracic inlet. Recommend repositioning and advancement. Esophagogastric tube with tip and side port below the diaphragm. - Unchanged diffuse, extensive bilateral heterogeneous opacity, consistent with multifocal infection, edema, and/or ARDS. 10/3 PCXR; slightly improved extensive bilateral heterogeneous opacifications (my read) 10/3 transfuse 1 unit CCP 10/5 transfuse 1 unit FFP 10/6 transfuse 1 unit CCP (this is patient's second unit) 10/10 PCXR -  Personally reviewed: Poor inspiration - mild improvement in aeration 10/11 PCXR - Personally reviewed: Left lower lobe appears to be improving; RLL stable 10/13 PCXR - Personally reviewed: Large R sided pneumothorax - re-expanded after chest tube placed overnight 10/14 PCXR;-small residual right apical pneumothorax, significantly decreased. -Stable extensive patchy opacities throughout both lungs 10/14 DG abdomen decubitus; negative evidence of pneumoperitoneum -Moderate gas in nondependent:; Nonobstructive bowel gas pattern 10/15 s/p bronchoscopy with placement of endobronchial balloon right intermedius bronchi 10/15 renal ultrasound; normal ultrasound 10/15 Per nephrology begin CRRT 10/15 s/p bronchoscopy place endobronchial balloon 10/16 s/p repeat bronchoscopy to adjust endobronchial balloon 10/16 transfuse 1 unit PRBC 10/17 s/p EGD   I have personally reviewed and interpreted all radiology studies and my findings are as above. VENTILATOR SETTINGS: Vent mode; PCV Vt Set;  Set rate; 28 FiO2; 50% I time;  PEEP; 10     Cultures 9/29 Novel coronavirus positive 10/2 blood LEFT antecubital NGTD 10/2 blood hand NGTD 10/3 MRSA by PCR negative     Antimicrobials: Anti-infectives (From admission, onward)   Start     Stop   08/07/19 1000  anidulafungin (ERAXIS) 100 mg in sodium chloride 0.9 % 100 mL IVPB         08/07/19 0800  ceFEPIme (MAXIPIME) 2 g in sodium chloride 0.9 % 100 mL IVPB         08/06/19 1000  anidulafungin (ERAXIS) 200 mg in sodium chloride 0.9 % 200 mL IVPB    Note to Pharmacy: please   08/06/19 1514   08/06/19 0800  ceFEPIme (MAXIPIME) 2 g in sodium chloride 0.9 % 100 mL IVPB  Status:  Discontinued     08/06/19 1036   08/02/19 2200  ceFAZolin (ANCEF) IVPB 2g/100 mL premix  Status:  Discontinued     08/06/19 0733   08/02/19 1400  vancomycin (VANCOCIN) 1,250 mg in sodium chloride 0.9 % 250 mL IVPB  Status:  Discontinued     08/02/19 1332   08/01/19 1330   vancomycin (VANCOCIN) 2,000 mg in sodium chloride 0.9 % 500 mL IVPB     08/01/19 1636   08/01/19 1300  ceFEPIme (MAXIPIME) 2 g in sodium chloride 0.9 % 100 mL IVPB  Status:  Discontinued     08/02/19 1332   07/26/19 1600  remdesivir 100 mg in sodium chloride 0.9 % 250 mL IVPB     07/29/19 1823   07/26/19 0215  remdesivir 200 mg in sodium chloride 0.9 % 250 mL IVPB     07/26/19 0520       Devices    LINES / TUBES:  #7.5 ETT 10/2>> RIGHT IJ CVL>>> RIGHT chest tube 10/13>>> LEFT Ig HD cath 10/14>> RIGHT interlobar bronchial balloon>>   Continuous Infusions:   prismasol BGK 4/2.5 500 mL/hr at 08/08/19 1327    prismasol BGK 4/2.5 500 mL/hr at 08/09/19 0158   sodium chloride Stopped (08/01/19 0957)   sodium chloride 10 mL/hr at 08/09/19 1000  amiodarone 30 mg/hr (08/09/19 1350)   ceFEPime (MAXIPIME) IV 2 g (08/09/19 1009)   cisatracurium (NIMBEX) infusion 3 mcg/kg/min (08/09/19 1442)   epinephrine     feeding supplement (PIVOT 1.5 CAL) 40 mL/hr at 08/09/19 0700   fentaNYL infusion INTRAVENOUS 50 mcg/hr (08/09/19 1000)   midazolam 1 mg/hr (08/09/19 0700)   norepinephrine (LEVOPHED) Adult infusion 7 mcg/min (08/09/19 1024)   octreotide  (SANDOSTATIN)    IV infusion 50 mcg/hr (08/09/19 1000)   pantoprozole (PROTONIX) infusion 8 mg/hr (08/09/19 1022)   prismasol BGK 4/2.5 2,000 mL/hr at 08/09/19 0631   vasopressin (PITRESSIN) infusion - *FOR SHOCK* 0.03 Units/min (08/09/19 1000)     Objective: Vitals:   08/09/19 1400 08/09/19 1430 08/09/19 1630 08/09/19 1700  BP: (!) 140/115 (!) 107/57    Pulse: 79 (!) 105 72 75  Resp: 20 19 (!) 30 (!) 30  Temp:      TempSrc:      SpO2: 95% 95% 100% 100%  Weight:      Height:        Intake/Output Summary (Last 24 hours) at 08/09/2019 1728 Last data filed at 08/09/2019 1700 Gross per 24 hour  Intake 4225.85 ml  Output 3005 ml  Net 1220.85 ml   Filed Weights   08/07/19 0320 08/08/19 0500 08/09/19 0441  Weight:  90.1 kg 90.1 kg 95.3 kg   Physical Exam:  General: Sedated/intubated, positive acute respiratory distress Eyes: negative scleral hemorrhage, negative anisocoria, negative icterus ENT: Negative Runny nose, negative gingival bleeding, Neck:  Negative scars, masses, torticollis, lymphadenopathy, JVD #7.5 ETT tube in place positive endobronchial balloon in position Lungs: Although Pleur-evac shows larger air leak, tachypneic clear to auscultation bilaterally without wheezes or crackles Cardiovascular: Tachycardic without murmur gallop or rub normal S1 and S2 Abdomen: negative abdominal pain, positive distention, positive soft, bowel sounds, no rebound, no ascites, no appreciable mass Extremities: No significant cyanosis, clubbing, or edema bilateral lower extremities Skin: Negative rashes, lesions, ulcers Psychiatric: Sedated/intubated Central nervous system: Sedated/intubated             Data Reviewed: Care during the described time interval was provided by me .  I have reviewed this patient's available data, including medical history, events of note, physical examination, and all test results as part of my evaluation.   CBC: Recent Labs  Lab 08/06/19 1750  08/07/19 0500  08/08/19 0600  08/09/19 0329 08/09/19 0502 08/09/19 1255 08/09/19 1319 08/09/19 1430  WBC 15.9*  --  12.1*  --  10.2  --   --  10.1  --  10.0  --   NEUTROABS  --   --  11.3*  --  9.6*  --   --  9.2*  --   --   --   HGB 9.5*   < > 8.1*   < > 7.2*   < > 8.5* 7.9* 8.2* 8.3* 7.8*  HCT 31.3*   < > 27.2*   < > 22.8*   < > 25.0* 24.6* 24.0* 25.7* 23.0*  MCV 103.3*  --  103.4*  --  97.4  --   --  94.3  --  95.9  --   PLT 245  --  204  --  187  --   --  131*  --  128*  --    < > = values in this interval not displayed.   Basic Metabolic Panel: Recent Labs  Lab 08/07/19 0500  08/07/19 1605  08/08/19 0600  08/08/19 1554 08/08/19 1951  08/09/19 0329 08/09/19 0502 08/09/19 1255 08/09/19 1319 08/09/19 1430   NA 146*   < > 145   < > 144   141   < > 142 144 143 144 142 141 141  K 5.1   < > 5.2*   < > 4.8   4.7   < > 4.7 4.7 4.7 5.0 4.8 5.2* 5.0  CL 103  --  106  --  107   105  --  108 110  --  109  --  109  --   CO2 27  --  25  --  25   26  --  23 22  --  22  --  21*  --   GLUCOSE 362*  --  220*  --  191*   197*  --  209* 186*  --  172*  --  194*  --   BUN 185*  --  162*  --  113*   88*  --  108* 99*  --  83*  --  94*  --   CREATININE 5.44*  --  4.81*  --  3.13*   3.36*  --  3.28* 2.86*  --  2.67*  --  2.97*  --   CALCIUM 7.1*  --  7.2*  --  7.7*   7.5*  --  7.7* 7.6*  --  7.7*  --  7.9*  --   MG 4.0*  --   --   --  2.9*  --   --   --   --  2.8*  --   --   --   PHOS 11.2*  --  8.1*  --  3.1   3.1  --  3.5  --   --  4.4  --   --   --    < > = values in this interval not displayed.   GFR: Estimated Creatinine Clearance: 28.3 mL/min (A) (by C-G formula based on SCr of 2.97 mg/dL (H)). Liver Function Tests: Recent Labs  Lab 08/05/19 0550 08/06/19 0610 08/07/19 0500 08/07/19 1605 08/08/19 0600 08/08/19 1554 08/09/19 0502  AST 31 22 28   --  45*  --  39  ALT 19 9 6   --  5  --  5  ALKPHOS 104 45 65  --  93  --  91  BILITOT 0.6 1.0 0.3  --  0.5  --  0.5  PROT 6.8 5.3* 5.0*  --  5.7*  --  5.7*  ALBUMIN 3.5 3.2* 2.5* 2.7* 2.6*   2.8* 2.6* 2.4*   No results for input(s): LIPASE, AMYLASE in the last 168 hours. No results for input(s): AMMONIA in the last 168 hours. Coagulation Profile: No results for input(s): INR, PROTIME in the last 168 hours. Cardiac Enzymes: No results for input(s): CKTOTAL, CKMB, CKMBINDEX, TROPONINI in the last 168 hours. BNP (last 3 results) No results for input(s): PROBNP in the last 8760 hours. HbA1C: No results for input(s): HGBA1C in the last 72 hours. CBG: Recent Labs  Lab 08/08/19 1951 08/08/19 2341 08/09/19 0434 08/09/19 0824 08/09/19 1253  GLUCAP 180* 187* 159* 198* 184*   Lipid Profile: No results for input(s): CHOL, HDL, LDLCALC, TRIG, CHOLHDL,  LDLDIRECT in the last 72 hours. Thyroid Function Tests: No results for input(s): TSH, T4TOTAL, FREET4, T3FREE, THYROIDAB in the last 72 hours. Anemia Panel: No results for input(s): VITAMINB12, FOLATE, FERRITIN, TIBC, IRON, RETICCTPCT in the last 72 hours. Urine analysis:    Component  Value Date/Time   COLORURINE YELLOW 08/01/2019 1745   APPEARANCEUR HAZY (A) 08/01/2019 1745   LABSPEC 1.023 08/01/2019 1745   PHURINE 5.0 08/01/2019 1745   GLUCOSEU NEGATIVE 08/01/2019 1745   HGBUR NEGATIVE 08/01/2019 1745   BILIRUBINUR NEGATIVE 08/01/2019 1745   KETONESUR NEGATIVE 08/01/2019 1745   PROTEINUR NEGATIVE 08/01/2019 1745   NITRITE NEGATIVE 08/01/2019 1745   LEUKOCYTESUR NEGATIVE 08/01/2019 1745   Sepsis Labs: @LABRCNTIP (procalcitonin:4,lacticidven:4)  ) Recent Results (from the past 240 hour(s))  Culture, blood (Routine X 2) w Reflex to ID Panel     Status: Abnormal   Collection Time: 08/01/19 12:15 PM   Specimen: BLOOD  Result Value Ref Range Status   Specimen Description   Final    BLOOD RIGHT ARM Performed at [facility], [place] [address], [place], [place] [address]    Special Requests   Final    BOTTLES DRAWN AEROBIC AND ANAEROBIC Blood Culture adequate volume Performed at [place] [address], [place], [place] [address]    Culture  Setup Time   Final    GRAM POSITIVE COCCI IN CLUSTERS IN BOTH AEROBIC AND ANAEROBIC BOTTLES CRITICAL RESULT CALLED TO, READ BACK BY AND VERIFIED WITH: C. SHADE PHARMD, AT 1194 08/02/19 BY Rush [place]mark Performed at [facility], [place] [address], [place], [place] [address]    Culture STAPHYLOCOCCUS AUREUS (A)  Final   Report Status 08/04/2019 FINAL  Final   Organism ID, Bacteria STAPHYLOCOCCUS AUREUS  Final      Susceptibility   Staphylococcus aureus - MIC*    CIPROFLOXACIN <=0.5 SENSITIVE Sensitive     ERYTHROMYCIN <=0.25 SENSITIVE Sensitive     GENTAMICIN <=0.5 SENSITIVE Sensitive      OXACILLIN 0.5 SENSITIVE Sensitive     TETRACYCLINE <=1 SENSITIVE Sensitive     VANCOMYCIN 1 SENSITIVE Sensitive     TRIMETH/SULFA <=10 SENSITIVE Sensitive     CLINDAMYCIN <=0.25 SENSITIVE Sensitive     RIFAMPIN <=0.5 SENSITIVE Sensitive     Inducible Clindamycin NEGATIVE Sensitive     * STAPHYLOCOCCUS AUREUS  Blood Culture ID Panel (Reflexed)     Status: Abnormal   Collection Time: 08/01/19 12:15 PM  Result Value Ref Range Status   Enterococcus species NOT DETECTED NOT DETECTED Final   Listeria monocytogenes NOT DETECTED NOT DETECTED Final   Staphylococcus species DETECTED (A) NOT DETECTED Final    Comment: CRITICAL RESULT CALLED TO, READ BACK BY AND VERIFIED WITH: C. SHADE PHARMD, AT 1448 08/02/19 BY D. VANHOOK    Staphylococcus aureus (BCID) DETECTED (A) NOT DETECTED Final    Comment: Methicillin (oxacillin) susceptible Staphylococcus aureus (MSSA). Preferred therapy is anti staphylococcal beta lactam antibiotic (Cefazolin or Nafcillin), unless clinically contraindicated. CRITICAL RESULT CALLED TO, READ BACK BY AND VERIFIED WITH: C. SHADE PHARMD, AT 1856 08/02/19 BY D. VANHOOK    Methicillin resistance NOT DETECTED NOT DETECTED Final   Streptococcus species NOT DETECTED NOT DETECTED Final   Streptococcus agalactiae NOT DETECTED NOT DETECTED Final   Streptococcus pneumoniae NOT DETECTED NOT DETECTED Final   Streptococcus pyogenes NOT DETECTED NOT DETECTED Final   Acinetobacter baumannii NOT DETECTED NOT DETECTED Final   Enterobacteriaceae species NOT DETECTED NOT DETECTED Final   Enterobacter cloacae complex NOT DETECTED NOT DETECTED Final   Escherichia coli NOT DETECTED NOT DETECTED Final   Klebsiella oxytoca NOT DETECTED NOT DETECTED Final   Klebsiella pneumoniae NOT DETECTED NOT DETECTED Final   Proteus species NOT DETECTED NOT DETECTED Final   Serratia marcescens  NOT DETECTED NOT DETECTED Final   Haemophilus influenzae NOT DETECTED NOT DETECTED Final   Neisseria meningitidis  NOT DETECTED NOT DETECTED Final   Pseudomonas aeruginosa NOT DETECTED NOT DETECTED Final   Candida albicans NOT DETECTED NOT DETECTED Final   Candida glabrata NOT DETECTED NOT DETECTED Final   Candida krusei NOT DETECTED NOT DETECTED Final   Candida parapsilosis NOT DETECTED NOT DETECTED Final   Candida tropicalis NOT DETECTED NOT DETECTED Final    Comment: Performed at [facility], [place] [address], [place], [place] [address]  Culture, blood (Routine X 2) w Reflex to ID Panel     Status: Abnormal   Collection Time: 08/01/19 12:21 PM   Specimen: BLOOD  Result Value Ref Range Status   Specimen Description   Final    BLOOD RIGHT HAND Performed at [place] [address], [place], [place] [address]    Special Requests   Final    BOTTLES DRAWN AEROBIC AND ANAEROBIC Blood Culture adequate volume Performed at [place] [address] [place], [place] [address]    Culture  Setup Time   Final    GRAM POSITIVE COCCI IN CLUSTERS IN BOTH AEROBIC AND ANAEROBIC BOTTLES CRITICAL RESULT CALLED TO, READ BACK BY AND VERIFIED WITH: C. SHADE PHARMD, AT 0623 08/02/19 BY D. VANHOOK    Culture (A)  Final    STAPHYLOCOCCUS AUREUS SUSCEPTIBILITIES PERFORMED ON PREVIOUS CULTURE WITHIN THE LAST 5 DAYS. Performed at [facility], [place] [address] [place], [place] [address]    Report Status 08/04/2019 FINAL  Final  Culture, respiratory     Status: None   Collection Time: 08/01/19  3:30 PM   Specimen: Tracheal Aspirate  Result Value Ref Range Status   Specimen Description   Final    TRACHEAL ASPIRATE Performed at [place] [address] [place], [place] [address]    Special Requests   Final    NONE Performed at [facility], [place] [address] [place], [place] [address]    Gram Stain   Final    RARE WBC PRESENT, PREDOMINANTLY PMN ABUNDANT GRAM POSITIVE COCCI IN CLUSTERS Performed at [place], [place] [address], [place],  [address]    Culture   Final    MODERATE STAPHYLOCOCCUS AUREUS RARE PSEUDOMONAS AERUGINOSA    Report Status 08/06/2019 FINAL  Final   Organism ID, Bacteria STAPHYLOCOCCUS AUREUS  Final   Organism ID, Bacteria PSEUDOMONAS AERUGINOSA  Final      Susceptibility   Pseudomonas aeruginosa - MIC*    CEFTAZIDIME 4 SENSITIVE Sensitive     CIPROFLOXACIN 0.5 SENSITIVE Sensitive     GENTAMICIN <=1 SENSITIVE Sensitive     IMIPENEM 1 SENSITIVE Sensitive     PIP/TAZO 16 SENSITIVE Sensitive     CEFEPIME 4 SENSITIVE Sensitive     * RARE PSEUDOMONAS AERUGINOSA   Staphylococcus aureus - MIC*    CIPROFLOXACIN <=0.5 SENSITIVE Sensitive     ERYTHROMYCIN <=0.25 SENSITIVE Sensitive     GENTAMICIN <=0.5 SENSITIVE Sensitive     OXACILLIN 0.5 SENSITIVE Sensitive     TETRACYCLINE <=1 SENSITIVE Sensitive     VANCOMYCIN <=0.5 SENSITIVE Sensitive     TRIMETH/SULFA <=10 SENSITIVE Sensitive     CLINDAMYCIN <=0.25 SENSITIVE Sensitive     RIFAMPIN <=0.5 SENSITIVE Sensitive     Inducible Clindamycin NEGATIVE Sensitive     * MODERATE STAPHYLOCOCCUS AUREUS  Culture, Urine     Status:  Abnormal   Collection Time: 08/01/19  5:45 PM   Specimen: Urine, Catheterized  Result Value Ref Range Status   Specimen Description   Final    URINE, CATHETERIZED Performed at [place] [address] [place], [place] [address]    Special Requests   Final    NONE Performed at [facility], [place] [address] [place], [place] [address]    Culture (A)  Final    <10,000 COLONIES/mL INSIGNIFICANT GROWTH Performed at [place] [address], [place], [place] [address]    Report Status 08/03/2019 FINAL  Final  MRSA PCR Screening     Status: None   Collection Time: 08/02/19 10:02 AM   Specimen: Nasal Mucosa; Nasopharyngeal  Result Value Ref Range Status   MRSA by PCR NEGATIVE NEGATIVE Final    Comment:        The GeneXpert MRSA Assay  (FDA approved for NASAL specimens only), is one component of a comprehensive MRSA colonization surveillance program. It is not intended to diagnose MRSA infection nor to guide or monitor treatment for MRSA infections. Performed at [facility], [place] [address] [place], [place] [address]   Culture, blood (routine x 2)     Status: None (Preliminary result)   Collection Time: 08/05/19  5:00 AM   Specimen: BLOOD RIGHT HAND  Result Value Ref Range Status   Specimen Description   Final    BLOOD RIGHT HAND Performed at [place] [address] [place], [place] [address]    Special Requests   Final    BOTTLES DRAWN AEROBIC ONLY Blood Culture adequate volume Performed at [place] [address], [place], [place] [address]    Culture   Final    NO GROWTH 4 DAYS Performed at [facility], [place] [address] [place], [place] [address]    Report Status PENDING  Incomplete  Culture, blood (routine x 2)     Status: None (Preliminary result)   Collection Time: 08/05/19  5:05 AM   Specimen: BLOOD LEFT HAND  Result Value Ref Range Status   Specimen Description   Final    BLOOD LEFT HAND Performed at [facility] [address] [place], [place] [address]    Special Requests   Final    BOTTLES DRAWN AEROBIC ONLY Blood Culture adequate volume Performed at [facility] [address] [place], [place] [address]    Culture   Final    NO GROWTH 4 DAYS Performed at [facility], [place] [address], [place],  [address]    Report Status PENDING  Incomplete         Radiology Studies: Dg Chest Port 1 View  Result Date: 08/09/2019 CLINICAL DATA:  Right-sided pneumothorax. Acute respiratory failure. COVID-19. EXAM: PORTABLE CHEST 1 VIEW COMPARISON:  08/08/2019; 08/07/2019; chest CT-07/25/2019 FINDINGS: Grossly unchanged cardiac silhouette and mediastinal contours. Stable  positioning of support apparatus with endotracheal tube overlying tracheal air column with tip superior to the carina, left jugular approach central venous catheter tips projected over the superior SVC, right subclavian approach central venous catheter tip projects over the superior caval junction, enteric tube tip and side port projected over the gastric antrum and right-sided chest tube and selective balloon occlusion catheter involving the right mainstem bronchus. No pneumothorax. Redemonstrated extensive bilateral slightly nodular airspace opacities. Left basilar/retrocardiac consolidative opacities are unchanged. No new focal airspace opacities. No definite pleural effusion.  No acute osseous abnormalities. IMPRESSION: 1.  Stable positioning of support apparatus.  No pneumothorax. 2. No significant change in extensive bilateral airspace opacities compatible with provided history of COVID-19 infection. Electronically Signed   By: Sandi Mariscal M.D.   On: 08/09/2019 05:18   Dg Chest Port 1 View  Result Date: 08/08/2019 CLINICAL DATA:  Pulmonary infiltrates. Acute respiratory failure with hypoxemia. COVID-19. EXAM: PORTABLE CHEST 1 VIEW COMPARISON:  08/07/2019 and 08/06/2019 FINDINGS: Endotracheal tube in good position 4.7 cm above the carina. Uniblocker tube tip is in the right lower lobe. Left jugular vein catheter tip is in the SVC at the level of the carina. Right subclavian catheter tip is in the right atrium. Right-sided chest tube at the base, unchanged. NG tube tip below the diaphragm. There is improved aeration at the right lung base and right midzone. No change in the infiltrates at the left base and left midzone. Heart size and vascularity are normal. IMPRESSION: 1. Improved aeration at the right lung base. No change in the infiltrates at the left base and left midzone. No residual pneumothorax. 2. Tubes and lines appear essentially unchanged. Electronically Signed   By: Lorriane Shire M.D.   On:  08/08/2019 16:27   Dg Chest Port 1 View  Result Date: 08/07/2019 CLINICAL DATA:  Evaluate endotracheal tube position. COVID-19 positive. EXAM: PORTABLE CHEST 1 VIEW COMPARISON:  08/06/2019 FINDINGS: Endotracheal tube terminates 4.9 cm above carina. Nasogastric tube extends beyond the inferior aspect of the film. A right-sided chest tube remains in place. The right-sided pleural drain has been removed. Left internal jugular line tip at mid SVC. Right subclavian line tip at high right atrium, similar. Mild cardiomegaly. Increased fluid within the right minor fissure. The previously described right apical pneumothorax is either improved or resolved. Possible trace pleural air remaining. Increased right base airspace disease. Relatively similar patchy right upper and left mid to lower lung pulmonary opacities. IMPRESSION: Appropriate position of endotracheal tube. Single right chest tube remaining in place, with improved to resolved right apical pneumothorax. Increased right pleural fluid with worsened right base airspace disease. Otherwise similar multifocal pneumonia. Electronically Signed   By: Abigail Miyamoto M.D.   On: 08/07/2019 20:18        Scheduled Meds:  sodium chloride   Intravenous Once   artificial tears  1 application Both Eyes K5L   chlorhexidine  15 mL Mouth/Throat BID   Chlorhexidine Gluconate Cloth  6 each Topical Daily   feeding supplement (PRO-STAT SUGAR FREE 64)  60 mL Per Tube TID   free water  125 mL Per Tube Q4H   hydrocortisone sodium succinate  50 mg Intravenous Q6H   insulin aspart  0-9 Units Subcutaneous Q4H   magnesium citrate  0.5 Bottle Per Tube Once   mouth rinse  15 mL Mouth Rinse 10 times per day   oxyCODONE  5 mg Oral Q6H   polyethylene glycol  17 g Per Tube BID   QUEtiapine  25 mg Oral BID   sennosides  5 mL Per Tube BID   sucralfate  1 g Oral Q6H   tamsulosin  0.4 mg Oral BID   Continuous Infusions:   prismasol BGK 4/2.5 500 mL/hr at  08/08/19 1327    prismasol BGK 4/2.5 500 mL/hr at 08/09/19 0158   sodium chloride Stopped (08/01/19 0957)   sodium chloride 10 mL/hr at 08/09/19 1000   amiodarone 30 mg/hr (08/09/19 1350)   ceFEPime (MAXIPIME) IV 2 g (08/09/19 1009)   cisatracurium (  NIMBEX) infusion 3 mcg/kg/min (08/09/19 1442)   epinephrine     feeding supplement (PIVOT 1.5 CAL) 40 mL/hr at 08/09/19 0700   fentaNYL infusion INTRAVENOUS 50 mcg/hr (08/09/19 1000)   midazolam 1 mg/hr (08/09/19 0700)   norepinephrine (LEVOPHED) Adult infusion 7 mcg/min (08/09/19 1024)   octreotide  (SANDOSTATIN)    IV infusion 50 mcg/hr (08/09/19 1000)   pantoprozole (PROTONIX) infusion 8 mg/hr (08/09/19 1022)   prismasol BGK 4/2.5 2,000 mL/hr at 08/09/19 0631   vasopressin (PITRESSIN) infusion - *FOR SHOCK* 0.03 Units/min (08/09/19 1000)     LOS: 15 days   The patient is critically ill with multiple organ systems failure and requires high complexity decision making for assessment and support, frequent evaluation and titration of therapies, application of advanced monitoring technologies and extensive interpretation of multiple databases. Critical Care Time devoted to patient care services described in this note  Time spent: 40 minutes     Orena Cavazos, Geraldo Docker, MD [facility] Pager [phone]  If 7PM-7AM, please contact night-coverage [email or web address] Password [facility] 08/09/2019, 5:28 PM

## 2019-08-09 NOTE — Progress Notes (Addendum)
2000 0-4 temporal twitches at 80 mA. No vent triggers, still titrating Nimbex gtt down to 2.5 mcg/kg/min  2100 0/4 temporal twitches, nimbex down to 2  2200 0/4 temporal twitches, nimbex down to 1.5  462 mL on bladder scan.  Attempted to straight cath and was unable to pass catheter through urethra.  May need coude.  On tamsulosin.  E link notified.  0140 3/4 twitches on 1.5 mcg/kg/min, nimbex to 2 mcg/kg/min  0340  2/4 twitches on 2 mcg/kg/min, nimbex remains at 2 mcg/kg/min

## 2019-08-09 NOTE — Progress Notes (Signed)
(  Late entry)  Patient's endoscopy this afternoon was well-tolerated.  Findings discussed with Dr. Dia Crawford and with patient's wife by telephone.  The main findings were:  1.  No active bleeding or blood in the stomach 2.  Very large (700 mL) gastric residual, mostly tube feeding, without any anatomic gastric outlet obstruction, suggestive of gastric ileus 3.  Gastric ulcer at the angularis of lesser curve of stomach, approximately 1.5 to 2 cm in greatest dimension, cratered, benign-appearing, nonbleeding, with visible vessel but no adherent clot--successfully clipped  Recommendations:  1.  Check for serologic evidence of Helicobacter pylori infection (ordered)-- (biopsies for Helicobacter pylori were not obtained during his procedure); consider treatment electively as outpatient following hospital discharge, if the antibody comes back positive  2.  Consider significant reduction in rate of intragastric tube feedings, or consider placement of a postpyloric feeding tube, to prevent accumulation of tube feeding in the stomach (gastric distention might contribute to restrictive lung disease, as well as placing patient at risk for aspiration after he is extubated).  At the very least, if gastric feedings are continued, I would check gastric residuals every 4 hours  3.  I believe that the ulcer is at fairly low risk of rebleeding, so per discussion with Dr. Sherral Hammers, I think it would be reasonable to try resuming anticoagulation tomorrow, if the patient remained stable in the meantime  4.  Since there was no evidence of portal hypertension on his endoscopy, I have stopped the patient's octreotide  5.  Would continue sucralfate for at least 1 week to help with ulcer healing; if patient is stable tomorrow, could probably consider changing continuous Protonix infusion to every 12 hours and would probably continue twice daily dosing for a month, then could switch to once daily dosing  6.  I will put  the patient on our follow-up list to consider possible follow-up endoscopy in 2 to 3 months to confirm healing of his gastric ulcer, although the ulcer certainly was benign in appearance and a decision regarding repeat endoscopy would be dependent on the patient's clinical status and fitness for sedation and repeat endoscopy at that time  Please feel free to call us with questions.  [name], M.D. Pager [phone] If no answer or after 5 PM call [phone]

## 2019-08-10 ENCOUNTER — Inpatient Hospital Stay (HOSPITAL_COMMUNITY): Payer: Medicare HMO

## 2019-08-10 DIAGNOSIS — K922 Gastrointestinal hemorrhage, unspecified: Secondary | ICD-10-CM | POA: Diagnosis not present

## 2019-08-10 DIAGNOSIS — Z9689 Presence of other specified functional implants: Secondary | ICD-10-CM | POA: Diagnosis not present

## 2019-08-10 DIAGNOSIS — J9601 Acute respiratory failure with hypoxia: Secondary | ICD-10-CM | POA: Diagnosis not present

## 2019-08-10 DIAGNOSIS — U071 COVID-19: Secondary | ICD-10-CM | POA: Diagnosis not present

## 2019-08-10 DIAGNOSIS — N179 Acute kidney failure, unspecified: Secondary | ICD-10-CM | POA: Diagnosis not present

## 2019-08-10 DIAGNOSIS — A4901 Methicillin susceptible Staphylococcus aureus infection, unspecified site: Secondary | ICD-10-CM | POA: Diagnosis not present

## 2019-08-10 DIAGNOSIS — N401 Enlarged prostate with lower urinary tract symptoms: Secondary | ICD-10-CM | POA: Diagnosis not present

## 2019-08-10 DIAGNOSIS — R338 Other retention of urine: Secondary | ICD-10-CM

## 2019-08-10 DIAGNOSIS — K921 Melena: Secondary | ICD-10-CM | POA: Diagnosis not present

## 2019-08-10 DIAGNOSIS — R579 Shock, unspecified: Secondary | ICD-10-CM | POA: Diagnosis not present

## 2019-08-10 DIAGNOSIS — Z8719 Personal history of other diseases of the digestive system: Secondary | ICD-10-CM

## 2019-08-10 DIAGNOSIS — I4891 Unspecified atrial fibrillation: Secondary | ICD-10-CM | POA: Diagnosis not present

## 2019-08-10 DIAGNOSIS — E872 Acidosis: Secondary | ICD-10-CM | POA: Diagnosis not present

## 2019-08-10 DIAGNOSIS — K5909 Other constipation: Secondary | ICD-10-CM | POA: Diagnosis not present

## 2019-08-10 DIAGNOSIS — I959 Hypotension, unspecified: Secondary | ICD-10-CM | POA: Diagnosis not present

## 2019-08-10 DIAGNOSIS — J1289 Other viral pneumonia: Secondary | ICD-10-CM | POA: Diagnosis not present

## 2019-08-10 DIAGNOSIS — Z8619 Personal history of other infectious and parasitic diseases: Secondary | ICD-10-CM

## 2019-08-10 DIAGNOSIS — D649 Anemia, unspecified: Secondary | ICD-10-CM | POA: Diagnosis not present

## 2019-08-10 DIAGNOSIS — K254 Chronic or unspecified gastric ulcer with hemorrhage: Secondary | ICD-10-CM | POA: Diagnosis present

## 2019-08-10 DIAGNOSIS — E8779 Other fluid overload: Secondary | ICD-10-CM | POA: Diagnosis not present

## 2019-08-10 HISTORY — DX: Personal history of other infectious and parasitic diseases: Z86.19

## 2019-08-10 HISTORY — DX: Personal history of other diseases of the digestive system: Z87.19

## 2019-08-10 LAB — POCT I-STAT, CHEM 8
BUN: 106 mg/dL — ABNORMAL HIGH (ref 8–23)
BUN: 77 mg/dL — ABNORMAL HIGH (ref 8–23)
BUN: 72 mg/dL — ABNORMAL HIGH (ref 8–23)
BUN: 84 mg/dL — ABNORMAL HIGH (ref 8–23)
BUN: 63 mg/dL — ABNORMAL HIGH (ref 8–23)
BUN: 82 mg/dL — ABNORMAL HIGH (ref 8–23)
BUN: 72 mg/dL — ABNORMAL HIGH (ref 8–23)
BUN: 88 mg/dL — ABNORMAL HIGH (ref 8–23)
BUN: 55 mg/dL — ABNORMAL HIGH (ref 8–23)
BUN: 82 mg/dL — ABNORMAL HIGH (ref 8–23)
BUN: 48 mg/dL — ABNORMAL HIGH (ref 8–23)
BUN: 71 mg/dL — ABNORMAL HIGH (ref 8–23)
BUN: 48 mg/dL — ABNORMAL HIGH (ref 8–23)
BUN: 59 mg/dL — ABNORMAL HIGH (ref 8–23)
Calcium, Ion: 0.68 mmol/L — CL (ref 1.15–1.40)
Calcium, Ion: 1.12 mmol/L — ABNORMAL LOW (ref 1.15–1.40)
Calcium, Ion: 0.67 mmol/L — CL (ref 1.15–1.40)
Calcium, Ion: 1.09 mmol/L — ABNORMAL LOW (ref 1.15–1.40)
Calcium, Ion: 0.64 mmol/L — CL (ref 1.15–1.40)
Calcium, Ion: 1.16 mmol/L (ref 1.15–1.40)
Calcium, Ion: 0.62 mmol/L — CL (ref 1.15–1.40)
Calcium, Ion: 1.14 mmol/L — ABNORMAL LOW (ref 1.15–1.40)
Calcium, Ion: 0.63 mmol/L — CL (ref 1.15–1.40)
Calcium, Ion: 1.2 mmol/L (ref 1.15–1.40)
Calcium, Ion: 0.58 mmol/L — CL (ref 1.15–1.40)
Calcium, Ion: 1.18 mmol/L (ref 1.15–1.40)
Calcium, Ion: 0.59 mmol/L — CL (ref 1.15–1.40)
Calcium, Ion: 1.17 mmol/L (ref 1.15–1.40)
Chloride: 102 mmol/L (ref 98–111)
Chloride: 108 mmol/L (ref 98–111)
Chloride: 101 mmol/L (ref 98–111)
Chloride: 109 mmol/L (ref 98–111)
Chloride: 103 mmol/L (ref 98–111)
Chloride: 98 mmol/L (ref 98–111)
Chloride: 102 mmol/L (ref 98–111)
Chloride: 98 mmol/L (ref 98–111)
Chloride: 96 mmol/L — ABNORMAL LOW (ref 98–111)
Chloride: 98 mmol/L (ref 98–111)
Chloride: 93 mmol/L — ABNORMAL LOW (ref 98–111)
Chloride: 96 mmol/L — ABNORMAL LOW (ref 98–111)
Chloride: 91 mmol/L — ABNORMAL LOW (ref 98–111)
Chloride: 94 mmol/L — ABNORMAL LOW (ref 98–111)
Creatinine, Ser: 2.2 mg/dL — ABNORMAL HIGH (ref 0.61–1.24)
Creatinine, Ser: 3.1 mg/dL — ABNORMAL HIGH (ref 0.61–1.24)
Creatinine, Ser: 2 mg/dL — ABNORMAL HIGH (ref 0.61–1.24)
Creatinine, Ser: 2.6 mg/dL — ABNORMAL HIGH (ref 0.61–1.24)
Creatinine, Ser: 1.9 mg/dL — ABNORMAL HIGH (ref 0.61–1.24)
Creatinine, Ser: 2.7 mg/dL — ABNORMAL HIGH (ref 0.61–1.24)
Creatinine, Ser: 2 mg/dL — ABNORMAL HIGH (ref 0.61–1.24)
Creatinine, Ser: 2.7 mg/dL — ABNORMAL HIGH (ref 0.61–1.24)
Creatinine, Ser: 1.7 mg/dL — ABNORMAL HIGH (ref 0.61–1.24)
Creatinine, Ser: 2.4 mg/dL — ABNORMAL HIGH (ref 0.61–1.24)
Creatinine, Ser: 1.6 mg/dL — ABNORMAL HIGH (ref 0.61–1.24)
Creatinine, Ser: 2.3 mg/dL — ABNORMAL HIGH (ref 0.61–1.24)
Creatinine, Ser: 1.7 mg/dL — ABNORMAL HIGH (ref 0.61–1.24)
Creatinine, Ser: 2.3 mg/dL — ABNORMAL HIGH (ref 0.61–1.24)
Glucose, Bld: 206 mg/dL — ABNORMAL HIGH (ref 70–99)
Glucose, Bld: 228 mg/dL — ABNORMAL HIGH (ref 70–99)
Glucose, Bld: 185 mg/dL — ABNORMAL HIGH (ref 70–99)
Glucose, Bld: 221 mg/dL — ABNORMAL HIGH (ref 70–99)
Glucose, Bld: 178 mg/dL — ABNORMAL HIGH (ref 70–99)
Glucose, Bld: 208 mg/dL — ABNORMAL HIGH (ref 70–99)
Glucose, Bld: 183 mg/dL — ABNORMAL HIGH (ref 70–99)
Glucose, Bld: 213 mg/dL — ABNORMAL HIGH (ref 70–99)
Glucose, Bld: 180 mg/dL — ABNORMAL HIGH (ref 70–99)
Glucose, Bld: 211 mg/dL — ABNORMAL HIGH (ref 70–99)
Glucose, Bld: 171 mg/dL — ABNORMAL HIGH (ref 70–99)
Glucose, Bld: 212 mg/dL — ABNORMAL HIGH (ref 70–99)
Glucose, Bld: 179 mg/dL — ABNORMAL HIGH (ref 70–99)
Glucose, Bld: 221 mg/dL — ABNORMAL HIGH (ref 70–99)
HCT: 25 % — ABNORMAL LOW (ref 39.0–52.0)
HCT: 29 % — ABNORMAL LOW (ref 39.0–52.0)
HCT: 27 % — ABNORMAL LOW (ref 39.0–52.0)
HCT: 35 % — ABNORMAL LOW (ref 39.0–52.0)
HCT: 26 % — ABNORMAL LOW (ref 39.0–52.0)
HCT: 42 % (ref 39.0–52.0)
HCT: 22 % — ABNORMAL LOW (ref 39.0–52.0)
HCT: 24 % — ABNORMAL LOW (ref 39.0–52.0)
HCT: 23 % — ABNORMAL LOW (ref 39.0–52.0)
HCT: 27 % — ABNORMAL LOW (ref 39.0–52.0)
HCT: 24 % — ABNORMAL LOW (ref 39.0–52.0)
HCT: 27 % — ABNORMAL LOW (ref 39.0–52.0)
HCT: 24 % — ABNORMAL LOW (ref 39.0–52.0)
HCT: 27 % — ABNORMAL LOW (ref 39.0–52.0)
Hemoglobin: 8.5 g/dL — ABNORMAL LOW (ref 13.0–17.0)
Hemoglobin: 9.9 g/dL — ABNORMAL LOW (ref 13.0–17.0)
Hemoglobin: 11.9 g/dL — ABNORMAL LOW (ref 13.0–17.0)
Hemoglobin: 9.2 g/dL — ABNORMAL LOW (ref 13.0–17.0)
Hemoglobin: 14.3 g/dL (ref 13.0–17.0)
Hemoglobin: 8.8 g/dL — ABNORMAL LOW (ref 13.0–17.0)
Hemoglobin: 7.5 g/dL — ABNORMAL LOW (ref 13.0–17.0)
Hemoglobin: 8.2 g/dL — ABNORMAL LOW (ref 13.0–17.0)
Hemoglobin: 7.8 g/dL — ABNORMAL LOW (ref 13.0–17.0)
Hemoglobin: 9.2 g/dL — ABNORMAL LOW (ref 13.0–17.0)
Hemoglobin: 8.2 g/dL — ABNORMAL LOW (ref 13.0–17.0)
Hemoglobin: 9.2 g/dL — ABNORMAL LOW (ref 13.0–17.0)
Hemoglobin: 8.2 g/dL — ABNORMAL LOW (ref 13.0–17.0)
Hemoglobin: 9.2 g/dL — ABNORMAL LOW (ref 13.0–17.0)
Potassium: 4.5 mmol/L (ref 3.5–5.1)
Potassium: 4.5 mmol/L (ref 3.5–5.1)
Potassium: 4.3 mmol/L (ref 3.5–5.1)
Potassium: 4.4 mmol/L (ref 3.5–5.1)
Potassium: 4.4 mmol/L (ref 3.5–5.1)
Potassium: 4.8 mmol/L (ref 3.5–5.1)
Potassium: 4.4 mmol/L (ref 3.5–5.1)
Potassium: 4.6 mmol/L (ref 3.5–5.1)
Potassium: 4 mmol/L (ref 3.5–5.1)
Potassium: 4.2 mmol/L (ref 3.5–5.1)
Potassium: 4 mmol/L (ref 3.5–5.1)
Potassium: 4.2 mmol/L (ref 3.5–5.1)
Potassium: 4.1 mmol/L (ref 3.5–5.1)
Potassium: 4.2 mmol/L (ref 3.5–5.1)
Sodium: 141 mmol/L (ref 135–145)
Sodium: 142 mmol/L (ref 135–145)
Sodium: 141 mmol/L (ref 135–145)
Sodium: 143 mmol/L (ref 135–145)
Sodium: 139 mmol/L (ref 135–145)
Sodium: 140 mmol/L (ref 135–145)
Sodium: 139 mmol/L (ref 135–145)
Sodium: 140 mmol/L (ref 135–145)
Sodium: 138 mmol/L (ref 135–145)
Sodium: 140 mmol/L (ref 135–145)
Sodium: 138 mmol/L (ref 135–145)
Sodium: 139 mmol/L (ref 135–145)
Sodium: 138 mmol/L (ref 135–145)
Sodium: 139 mmol/L (ref 135–145)
TCO2: 22 mmol/L (ref 22–32)
TCO2: 24 mmol/L (ref 22–32)
TCO2: 21 mmol/L — ABNORMAL LOW (ref 22–32)
TCO2: 25 mmol/L (ref 22–32)
TCO2: 25 mmol/L (ref 22–32)
TCO2: 25 mmol/L (ref 22–32)
TCO2: 26 mmol/L (ref 22–32)
TCO2: 26 mmol/L (ref 22–32)
TCO2: 28 mmol/L (ref 22–32)
TCO2: 28 mmol/L (ref 22–32)
TCO2: 30 mmol/L (ref 22–32)
TCO2: 30 mmol/L (ref 22–32)
TCO2: 29 mmol/L (ref 22–32)
TCO2: 31 mmol/L (ref 22–32)

## 2019-08-10 LAB — POCT I-STAT 7, (LYTES, BLD GAS, ICA,H+H)
Acid-Base Excess: 1 mmol/L (ref 0.0–2.0)
Acid-Base Excess: 7 mmol/L — ABNORMAL HIGH (ref 0.0–2.0)
Bicarbonate: 26.1 mmol/L (ref 20.0–28.0)
Bicarbonate: 32.1 mmol/L — ABNORMAL HIGH (ref 20.0–28.0)
Calcium, Ion: 1.16 mmol/L (ref 1.15–1.40)
Calcium, Ion: 1.17 mmol/L (ref 1.15–1.40)
HCT: 23 % — ABNORMAL LOW (ref 39.0–52.0)
HCT: 24 % — ABNORMAL LOW (ref 39.0–52.0)
Hemoglobin: 7.8 g/dL — ABNORMAL LOW (ref 13.0–17.0)
Hemoglobin: 8.2 g/dL — ABNORMAL LOW (ref 13.0–17.0)
O2 Saturation: 93 %
O2 Saturation: 88 %
Patient temperature: 35.1
Patient temperature: 36.2
Potassium: 4.2 mmol/L (ref 3.5–5.1)
Potassium: 4.3 mmol/L (ref 3.5–5.1)
Sodium: 140 mmol/L (ref 135–145)
Sodium: 139 mmol/L (ref 135–145)
TCO2: 27 mmol/L (ref 22–32)
TCO2: 34 mmol/L — ABNORMAL HIGH (ref 22–32)
pCO2 arterial: 38 mmHg (ref 32.0–48.0)
pCO2 arterial: 46.2 mmHg (ref 32.0–48.0)
pH, Arterial: 7.436 (ref 7.350–7.450)
pH, Arterial: 7.446 (ref 7.350–7.450)
pO2, Arterial: 58 mmHg — ABNORMAL LOW (ref 83.0–108.0)
pO2, Arterial: 50 mmHg — ABNORMAL LOW (ref 83.0–108.0)

## 2019-08-10 LAB — CBC WITH DIFFERENTIAL/PLATELET
Abs Immature Granulocytes: 0.27 10*3/uL — ABNORMAL HIGH (ref 0.00–0.07)
Basophils Absolute: 0 10*3/uL (ref 0.0–0.1)
Basophils Relative: 0 %
Eosinophils Absolute: 0 10*3/uL (ref 0.0–0.5)
Eosinophils Relative: 0 %
HCT: 23.1 % — ABNORMAL LOW (ref 39.0–52.0)
Hemoglobin: 7.3 g/dL — ABNORMAL LOW (ref 13.0–17.0)
Immature Granulocytes: 3 %
Lymphocytes Relative: 3 %
Lymphs Abs: 0.3 10*3/uL — ABNORMAL LOW (ref 0.7–4.0)
MCH: 30.7 pg (ref 26.0–34.0)
MCHC: 31.6 g/dL (ref 30.0–36.0)
MCV: 97.1 fL (ref 80.0–100.0)
Monocytes Absolute: 0.4 10*3/uL (ref 0.1–1.0)
Monocytes Relative: 5 %
Neutro Abs: 7.7 10*3/uL (ref 1.7–7.7)
Neutrophils Relative %: 89 %
Platelets: 100 10*3/uL — ABNORMAL LOW (ref 150–400)
RBC: 2.38 MIL/uL — ABNORMAL LOW (ref 4.22–5.81)
RDW: 16.3 % — ABNORMAL HIGH (ref 11.5–15.5)
WBC: 8.7 10*3/uL (ref 4.0–10.5)
nRBC: 5.5 % — ABNORMAL HIGH (ref 0.0–0.2)

## 2019-08-10 LAB — GLUCOSE, CAPILLARY
Glucose-Capillary: 218 mg/dL — ABNORMAL HIGH (ref 70–99)
Glucose-Capillary: 151 mg/dL — ABNORMAL HIGH (ref 70–99)
Glucose-Capillary: 177 mg/dL — ABNORMAL HIGH (ref 70–99)
Glucose-Capillary: 144 mg/dL — ABNORMAL HIGH (ref 70–99)

## 2019-08-10 LAB — RENAL FUNCTION PANEL
Albumin: 2.2 g/dL — ABNORMAL LOW (ref 3.5–5.0)
Albumin: 2.3 g/dL — ABNORMAL LOW (ref 3.5–5.0)
Anion gap: 14 (ref 5–15)
Anion gap: 12 (ref 5–15)
BUN: 93 mg/dL — ABNORMAL HIGH (ref 8–23)
BUN: 74 mg/dL — ABNORMAL HIGH (ref 8–23)
CO2: 25 mmol/L (ref 22–32)
CO2: 30 mmol/L (ref 22–32)
Calcium: 8.6 mg/dL — ABNORMAL LOW (ref 8.9–10.3)
Calcium: 8.8 mg/dL — ABNORMAL LOW (ref 8.9–10.3)
Chloride: 104 mmol/L (ref 98–111)
Chloride: 97 mmol/L — ABNORMAL LOW (ref 98–111)
Creatinine, Ser: 2.82 mg/dL — ABNORMAL HIGH (ref 0.61–1.24)
Creatinine, Ser: 2.31 mg/dL — ABNORMAL HIGH (ref 0.61–1.24)
GFR calc Af Amer: 26 mL/min — ABNORMAL LOW (ref >60)
GFR calc Af Amer: 33 mL/min — ABNORMAL LOW (ref >60)
GFR calc non Af Amer: 22 mL/min — ABNORMAL LOW (ref >60)
GFR calc non Af Amer: 28 mL/min — ABNORMAL LOW (ref >60)
Glucose, Bld: 187 mg/dL — ABNORMAL HIGH (ref 70–99)
Glucose, Bld: 173 mg/dL — ABNORMAL HIGH (ref 70–99)
Phosphorus: 6.2 mg/dL — ABNORMAL HIGH (ref 2.5–4.6)
Phosphorus: 5.6 mg/dL — ABNORMAL HIGH (ref 2.5–4.6)
Potassium: 5 mmol/L (ref 3.5–5.1)
Potassium: 4.2 mmol/L (ref 3.5–5.1)
Sodium: 143 mmol/L (ref 135–145)
Sodium: 139 mmol/L (ref 135–145)

## 2019-08-10 LAB — COMPREHENSIVE METABOLIC PANEL
ALT: 9 U/L (ref 0–44)
AST: 34 U/L (ref 15–41)
Albumin: 2.2 g/dL — ABNORMAL LOW (ref 3.5–5.0)
Alkaline Phosphatase: 61 U/L (ref 38–126)
Anion gap: 13 (ref 5–15)
BUN: 94 mg/dL — ABNORMAL HIGH (ref 8–23)
CO2: 25 mmol/L (ref 22–32)
Calcium: 8.5 mg/dL — ABNORMAL LOW (ref 8.9–10.3)
Chloride: 103 mmol/L (ref 98–111)
Creatinine, Ser: 2.81 mg/dL — ABNORMAL HIGH (ref 0.61–1.24)
GFR calc Af Amer: 26 mL/min — ABNORMAL LOW (ref >60)
GFR calc non Af Amer: 22 mL/min — ABNORMAL LOW (ref >60)
Glucose, Bld: 188 mg/dL — ABNORMAL HIGH (ref 70–99)
Potassium: 4.9 mmol/L (ref 3.5–5.1)
Sodium: 141 mmol/L (ref 135–145)
Total Bilirubin: 0.3 mg/dL (ref 0.3–1.2)
Total Protein: 5.6 g/dL — ABNORMAL LOW (ref 6.5–8.1)

## 2019-08-10 LAB — CULTURE, BLOOD (ROUTINE X 2)
Culture: NO GROWTH
Culture: NO GROWTH
Special Requests: ADEQUATE
Special Requests: ADEQUATE

## 2019-08-10 LAB — HEMOGLOBIN AND HEMATOCRIT, BLOOD
HCT: 23 % — ABNORMAL LOW (ref 39.0–52.0)
HCT: 23.6 % — ABNORMAL LOW (ref 39.0–52.0)
Hemoglobin: 7.3 g/dL — ABNORMAL LOW (ref 13.0–17.0)
Hemoglobin: 7.7 g/dL — ABNORMAL LOW (ref 13.0–17.0)

## 2019-08-10 LAB — OCCULT BLOOD X 1 CARD TO LAB, STOOL: Fecal Occult Bld: POSITIVE — AB

## 2019-08-10 LAB — MAGNESIUM: Magnesium: 2.5 mg/dL — ABNORMAL HIGH (ref 1.7–2.4)

## 2019-08-10 LAB — D-DIMER, QUANTITATIVE: D-Dimer, Quant: 12.24 ug/mL-FEU — ABNORMAL HIGH (ref 0.00–0.50)

## 2019-08-10 LAB — PHOSPHORUS: Phosphorus: 6.1 mg/dL — ABNORMAL HIGH (ref 2.5–4.6)

## 2019-08-10 LAB — C-REACTIVE PROTEIN: CRP: 4.8 mg/dL — ABNORMAL HIGH (ref <1.0)

## 2019-08-10 MED ORDER — HEPARIN SODIUM (PORCINE) 5000 UNIT/ML IJ SOLN
5000.0000 [IU] | Freq: Three times a day (TID) | INTRAMUSCULAR | Status: DC
  Administered 2019-08-11 – 2019-08-12 (×4): 5000 [IU] via SUBCUTANEOUS
  Filled 2019-08-10 (×4): qty 1

## 2019-08-10 MED ORDER — FREE WATER
125.0000 mL | Freq: Four times a day (QID) | Status: DC
  Administered 2019-08-10 – 2019-08-12 (×6): 125 mL

## 2019-08-10 MED ORDER — HYDROCORTISONE NA SUCCINATE PF 100 MG IJ SOLR
50.0000 mg | Freq: Three times a day (TID) | INTRAMUSCULAR | Status: DC
  Administered 2019-08-11 – 2019-08-14 (×11): 50 mg via INTRAVENOUS
  Filled 2019-08-10 (×11): qty 2

## 2019-08-10 MED ORDER — PANTOPRAZOLE SODIUM 40 MG IV SOLR
40.0000 mg | Freq: Two times a day (BID) | INTRAVENOUS | Status: DC
  Administered 2019-08-10 – 2019-08-26 (×32): 40 mg via INTRAVENOUS
  Filled 2019-08-10 (×32): qty 40

## 2019-08-10 MED ORDER — SODIUM CHLORIDE 0.9% IV SOLUTION: Freq: Once | INTRAVENOUS | Status: AC

## 2019-08-10 NOTE — Progress Notes (Addendum)
NAME:  [name], MRN:  [id number], DOB:  1950/12/30, LOS: 94 ADMISSION DATE:  07/25/2019, CONSULTATION DATE:  10/3 REFERRING MD:  Bridgett Larsson, CHIEF COMPLAINT:  Dyspnea   Brief History   68 y/o male admitted on 10/3 from the [facility] ED with ARDS from COVID pneumonia leading to need for mechanical ventilation.    Past Medical History  COVID 19 Heartburn  Significant Hospital Events   10/3 admission 10/10 weaned 12 hours 10/11 dark stool 10/13 pneumothorax 10/15 endobronchial blocker placed 10/16 EB blocker repositioned  10/17: breath stacking, not tolerating lower tidal volumes, started paralytic to help with lower tidal volumes and persistent air leak from chest tube, despite good position of EB blocker (on CXR and still at same depth)  Consults:  PCCM  Procedures:  10/2 ETT>  10/2 R IJ CVL > 10/11 10/14 R chest tube >  10/14 R subclavian CVL >  10/14 L IJ HD cath >   Significant Diagnostic Tests:  10/2 CT angiogram chest > personally reviewed, extensive bilateral airspace disease predominantly posterior  Micro Data:  9/20 SARS COV 2 > POSITIVE 10/2 blood >  10/9 blood > MSSA 2/4 10/9 urine >  10/9 resp > MSSA, psuedomonas  10/13 blood >  10/14 blood >   Antimicrobials/COVID Rx  10/3 remdesivir > 10/6 10/3 actemra 10/3 decadron > 10/4 convalescent plasma, repeat 10/6  10/9 vanc > 10/10 10/9 cefepime > 10/10 10/10 Ancef > 10/13 10/13 Cefepime >  10/14 anidulofungin> 10/15  Interim history/subjective:   Endobronchial blocker repositioned 10/16 Paralyzed with nimbex yesterday NOW no air leak.  Endobronchial blocker remains in position at 57.5 cm (unchanged from yesterday)  Objective   Blood pressure 103/68, pulse 81, temperature (!) 96.3 F (35.7 C), temperature source Axillary, resp. rate 14, height 5\' 10"  (1.778 m), weight 96 kg, SpO2 95 %.    Vent Mode: PRVC FiO2 (%):  [40 %-60 %] 40 % Set Rate:  [28 bmp-30 bmp] 28 bmp Vt Set:  [480 mL] 480 mL PEEP:   [10 cmH20-12 cmH20] 12 cmH20 Plateau Pressure:  [27 cmH20-30 cmH20] 30 cmH20   Intake/Output Summary (Last 24 hours) at 08/10/2019 1619 Last data filed at 08/10/2019 1611 Gross per 24 hour  Intake 4204.95 ml  Output 3775 ml  Net 429.95 ml   HYPOTHERMIC, using Bair hugger  Filed Weights   08/08/19 0500 08/09/19 0441 08/10/19 0500  Weight: 90.1 kg 95.3 kg 96 kg    Examination:  General:  In bed on vent,  no response to pain (paralyzed) HENT: NCAT ETT in place PULM:lungs sound clear B today CV: RRR, no mgr GI: BS+, soft, nontender,  Dark green stool in rectal tube, no blood no obvious melena MSK: normal bulk and tone Neuro: sedated on vent   10/14 CXR images personally reviewed: EB blocker looks well positioned, ET tube good, CT in place. NO PTX  Resolved Hospital Problem list     Assessment & Plan:  Severe ARDS due to COVID 19 pneumonia: slowly improving oxygenation and ventilator mechanics Severe air leak now resolved.   VTin matching VT out,  Remains on water seal.  PaO2/FIO2 115 today.  Continue full vent support per ARDS protocol: Continue mechanical ventilation per ARDS protocol Patient is breath stacking (does better on PC but still stacking).  Given attempt to heal BPF and improved ABG (resolved resp acidosis), start paralysis to try to maintain lower tidal volumes.   Hold off on bronchoscopy for now given above findings.  Now on PRVC with TV 5ml/kg, tolerating.  Target TVol 6-8cc/kgIBW Target Plateau Pressure < 30cm H20 Target driving pressure less than 15 cm of water Target PaO2 55-65: titrate PEEP/FiO2 per protocol As long as PaO2 to FiO2 ratio is less than 1:150 position in prone position for 16 hours a day - Holding off on proning given EB blocker, tenuous positioning.   Check CVP daily if CVL in place Target CVP less than 4, diurese as necessary Ventilator associated pneumonia prevention protocol  Shock: Multifactorial.  Now resolving.  Off pressors  today.  Will reduce Hydrocortisone dose. Hydrocortisone 50q 6 since 10/16 - will start titrating down.    Afib with RVR Amio restarted yesterday. Now back in sinus.   Pneumothorax on R with large air leak Plan is to let it heal over weekend (as above) If no improvement in air leak next week consider endobronchial valve placement Improved now as noted above.  Started nimbex for control of tidal volume, now no air leak, better chance for healing of BPF.   AKI: worsening, now anuric (since 10/16) Continue CVVHD Net even.   Anticoagulation/DVT prevention in setting of thrombophilia from COVID 19 GIBleed, melena on rounds 10/16  GI consulted.  Gastric Ulcer with visible vessel, clipped.   Will change to BID ppi dose, cont sucralfate.  LARGE residual (700) tube feeds per GI. - reduce TF rate in half vs post pyloric feeds.  Id rather not manipulate G tube at this time given risk for trauma to ulcer, so will make sure rate is reduced.  HB overall stable.  Stool dark but no melena, not black.  Hold anticoagulation for now, should restart ppx dose tomorrow if remains stable.  SCD's for DVT prophylaxis   MSSA bacteremia: Septic shock 10/13> pseudomonas pneumonia? Other systemic infection? Fungal? Stopped vancomycin and anidulofungin 10/16 Continue cefepime Continue to wean off Levophed and vasopressin Levophed slowly weaning down.   Need for sedation/mechanical ventilation: severe agitation when weaning sedation RASS target -1 to -2 Oxycodone, seroquel to continue Continue fentanyl, versed infusion  FW 125q4 Quetiapine BID flomax  Oxycodone Best practice:  Diet: tube feeding  Pain/Anxiety/Delirium protocol (if indicated): as above VAP protocol (if indicated): yes DVT prophylaxis: held.  Need to restart 10/19 if no further bleeding GI prophylaxis: on ppi BID Glucose control: SSI Mobility: bed rest Code Status: full Family Communication: will update his wife [name] again today  Disposition: remain in ICU  Labs   CBC: Recent Labs  Lab 08/07/19 0500  08/08/19 0600  08/09/19 0502  08/09/19 1319  08/10/19 0500  08/10/19 1044 08/10/19 1048 08/10/19 1053 08/10/19 1522 08/10/19 1547  WBC 12.1*  --  10.2  --  10.1  --  10.0  --  8.7  --   --   --   --   --   --   NEUTROABS 11.3*  --  9.6*  --  9.2*  --   --   --  7.7  --   --   --   --   --   --   HGB 8.1*   < > 7.2*   < > 7.9*   < > 8.3*   < > 7.3*   < > 7.3* 7.8* 9.2* 8.2* 9.2*  HCT 27.2*   < > 22.8*   < > 24.6*   < > 25.7*   < > 23.1*   < > 23.0* 23.0* 27.0* 24.0* 27.0*  MCV 103.4*  --  97.4  --  94.3  --  95.9  --  97.1  --   --   --   --   --   --   PLT 204  --  187  --  131*  --  128*  --  100*  --   --   --   --   --   --    < > = values in this interval not displayed.    Basic Metabolic Panel: Recent Labs  Lab 08/07/19 0500  08/08/19 0600  08/08/19 1554  08/09/19 0502  08/09/19 1319  08/09/19 1758 08/09/19 2006  08/10/19 0500  08/10/19 0652 08/10/19 0747 08/10/19 1048 08/10/19 1053 08/10/19 1522 08/10/19 1547  NA 146*   < > 144  141   < > 142   < > 144   < > 141   < > 144 143   < > 141  143   < > 139 140 138 140 138 139  K 5.1   < > 4.8  4.7   < > 4.7   < > 5.0   < > 5.2*   < > 4.5 5.8*   < > 4.9  5.0   < > 4.6 4.2 4.2 4.0 4.2 4.0  CL 103   < > 107  105  --  108   < > 109  --  109  --  117* 110   < > 103  104   < > 102  --  98 96* 96* 93*  CO2 27   < > 25  26  --  23   < > 22  --  21*  --  18* 22  --  25  25  --   --   --   --   --   --   --   GLUCOSE 362*   < > 191*  197*  --  209*   < > 172*  --  194*  --  139* 152*   < > 188*  187*   < > 183*  --  180* 211* 171* 212*  BUN 185*   < > 113*  88*  --  108*   < > 83*  --  94*  --  87* 86*   < > 94*  93*   < > 88*  --  82* 55* 71* 48*  CREATININE 5.44*   < > 3.13*  3.36*  --  3.28*   < > 2.67*  --  2.97*  --  2.65* 3.36*   < > 2.81*  2.82*   < > 2.70*  --  2.40* 1.70* 2.30* 1.60*  CALCIUM 7.1*   < > 7.7*  7.5*  --  7.7*   < >  7.7*  --  7.9*  --  6.4* 7.7*  --  8.5*  8.6*  --   --   --   --   --   --   --   MG 4.0*  --  2.9*  --   --   --  2.8*  --   --   --   --   --   --  2.5*  --   --   --   --   --   --   --   PHOS 11.2*   < > 3.1  3.1  --  3.5  --  4.4  --   --   --  5.8*  --   --  6.1*  6.2*  --   --   --   --   --   --   --    < > = values in this interval not displayed.   GFR: Estimated Creatinine Clearance: 52.8 mL/min (A) (by C-G formula based on SCr of 1.6 mg/dL (H)). Recent Labs  Lab 08/08/19 0600 08/09/19 0502 08/09/19 1319 08/10/19 0500  WBC 10.2 10.1 10.0 8.7    Liver Function Tests: Recent Labs  Lab 08/06/19 0610 08/07/19 0500  08/08/19 0600 08/08/19 1554 08/09/19 0502 08/09/19 1758 08/10/19 0500  AST 22 28  --  45*  --  39  --  34  ALT 9 6  --  5  --  5  --  9  ALKPHOS 45 65  --  93  --  91  --  61  BILITOT 1.0 0.3  --  0.5  --  0.5  --  0.3  PROT 5.3* 5.0*  --  5.7*  --  5.7*  --  5.6*  ALBUMIN 3.2* 2.5*   < > 2.6*  2.8* 2.6* 2.4* 1.9* 2.2*  2.2*   < > = values in this interval not displayed.   No results for input(s): LIPASE, AMYLASE in the last 168 hours. No results for input(s): AMMONIA in the last 168 hours.  ABG    Component Value Date/Time   PHART 7.436 08/10/2019 0747   PCO2ART 38.0 08/10/2019 0747   PO2ART 58.0 (L) 08/10/2019 0747   HCO3 26.1 08/10/2019 0747   TCO2 30 08/10/2019 1547   ACIDBASEDEF 3.0 (H) 08/09/2019 1430   O2SAT 93.0 08/10/2019 0747     Coagulation Profile: No results for input(s): INR, PROTIME in the last 168 hours.  Cardiac Enzymes: No results for input(s): CKTOTAL, CKMB, CKMBINDEX, TROPONINI in the last 168 hours.  HbA1C: Hgb A1c MFr Bld  Date/Time Value Ref Range Status  07/26/2019 05:00 AM 6.1 (H) 4.8 - 5.6 % Final    Comment:    (NOTE) Pre diabetes:          5.7%-6.4% Diabetes:              >6.4% Glycemic control for   <7.0% adults with diabetes     CBG: Recent Labs  Lab 08/09/19 1253 08/09/19 1950 08/10/19 0055  08/10/19 0316 08/10/19 0645  GLUCAP 184* 136* 218* 151* 177*     Critical care time 55 minutes.    In evening, nurse told me he does have a small intermittent leak from chest tube.   He also was restarted on low dose of levophed.

## 2019-08-10 NOTE — Progress Notes (Signed)
PROGRESS NOTE    Takeru Bose  UKG:[id number] DOB: 10-Feb-1951 DOA: 07/25/2019 PCP: Kathyrn Drown, MD   Brief Narrative:  68 year old BM PMHx GERD, BPH   Presented to [facility] ER with shortness of breath and hypoxia.  Patient was diagnosed with COVID 19 on July 13 9019.  Patient's wife also has COVID-19 but is asymptomatic.  Over the last 1 to 2 days, the patient's shortness of breath is increased.  Patient has been suffering with anorexia.  Patient's wife was actually having a conversation via teleconference with the patient's PCP.  PCP is also otherwise physician.  He had actually called to check on how the wife was doing.  The wife had mentioned to the physician that the patient has been laying in bed.  When the physician listened to how the patient was speaking over the phone, it was noted the patient was tachypneic.  EMS was activated.  When EMS arrived, the patient's O2 saturations were apparently in the 40 percentile range.  Patient was placed on nonrebreather.  He was transferred to the ER.  In the ER, his oxygen saturations on 100% nonrebreather were only in the 80s.  Patient was confused.  Patient was emergently intubated.  Patient given IV steroids.  Initial chest x-ray demonstrates diffuse multifocal infiltrates.  CT scan of the chest was negative for PE.  ABG prior to leaving for [place] demonstrates a pH 7.41 PCO2 44 PO2 of 87 on FiO2 100%.  AA gradient calculates out to 571.  I personally called the patient's wife on the evening of July 25, 2019.  During that conversation we had discussed using IV steroids, IV remdesivir.  We also discussed off label use of Actemra and possibly confluence of plasma due to the patient's severe COVID-19 pneumonia.  She verbally agreed that the medical team should try anything possible to make the patient better.  Confirm with the wife the patient is a full code.    ED Course: emergently intubated. Central line placed. Started on  IV Decadron.    Subjective: 10/18 intubated/sedated.  Right pneumothorax air leak appears to have sealed itself for now.   Assessment & Plan:   Principal Problem:   Pneumonia due to COVID-19 virus Active Problems:   BPH (benign prostatic hyperplasia)   Acute respiratory failure due to COVID-19 (HCC)   AKI (acute kidney injury) (HCC)   Fluid overload   Pneumothorax on right   Acute renal failure (ARF) (HCC)   GI bleed   Atrial fibrillation with RVR (HCC)   Constipation   Gastric ulcer with hemorrhage  COVID pneumonia/acute respiratory stress with hypoxia/bacterial superinfection - Continue Decadron - Continue Remdesivir per pharmacy protocol - 10/2 Actemra x1 dose - 10/3 counseled patient wife on CCP over phone and she has agreed to allow [place] to administer CCP.  Paperwork in chart  -10/3 transfuse 1 unit CCP -Unable to prone secondary to RIGHT pneumothorax with fistula and air leak.   -10/3 patient enrolled in [place]  Lab 08/06/19 0610 08/07/19 0500 08/08/19 0600 08/09/19 0502 08/10/19 0500  CRP 4.6* 16.0* 10.4* 5.4* 4.8*   Recent Labs  Lab 08/06/19 0610 08/07/19 0500 08/08/19 0600 08/09/19 0502 08/10/19 0500  DDIMER 2.98* 4.74* 3.84* 4.64* 12.24*  - Titrate patient's vent settings to maintain SPO2 88 to 93%, if possible.  Will use ARDS lung protective protocol (6 to 8 cc/kg/min). - Attempt to maintain plateau pressure<30 - Attempt to maintain driving pressure 15 -  10/6 transfuse 1 unit CCP (this is patient's second unit) -10/10 MSSA bacteremia.  Antibiotics initiated per ID recommendations -10/13 PCXR large RIGHT sided pneumothorax; s/p chest tube placement with reinflation of lung.  High-volume leak (questionable minimal leak at insertion site improved with Vaseline gauze) -10/14 continue broad-spectrum antibiotic and antifungal added -10/14 stress dose steroids added -10/15 s/p bronchoscopy with placement of endobronchial balloon  right intermedius bronchi.  Patient now with minimal air leak -10/16 s/p repeat bronchoscopy to adjust endobronchial balloon.  Airleak now again resealed.  Still minimal air leak. -10/17 although air leak appears to have worsened by observation of Pleur-evac, clinically patient stable.  This is most likely secondary to patient trying to initiate large breaths.  Will paralyze patient with Nimbex.  NOTE after paralyzation with Nimbex decreased air leak, this will allow patient's lung to hopefully heal without further intervention  A. fib RVR -Had talked about cardioverting patient but given right pneumothorax with leak and endobronchial balloon in place would not chance procedure. -10/17 restart amiodarone drip -10/19 restart heparin  Septic shock/Hypotension -Continued Norepinephrine and Vasopressin drip to maintain MAP>65  GI bleed  Recent Labs  Lab 08/10/19 1048 08/10/19 1053 08/10/19 1522 08/10/19 1547 08/10/19 1623  HGB 7.8* 9.2* 8.2* 9.2* 8.2*  -Hemoglobin trending down over last 24 hours. -Occult blood pending -10/14 Heparin SQ (hold).   -10/16 GI bleed continuing off Heparin for 48 hrs. . -10/16 Transfuse 1 unit PRBC -Increase Protonix BID---> Protonix gtt -10/16 start Octreotide gtt  -BMP q 8 hr per GI -Discussed case with Dr. Gerilyn Nestle GI.  See her note from 10/16 -10/16 Dr. Roselie Awkward spoke with Dr. Joylene Igo GI and they agreed that if patient continues to bleed will bring equipment to [facility] to perform any required procedure. -10/17 s/p EGD per Dr. Joannie [place] GI large gastric ulcer with vessel successfully clipped see report below -10/18 H&H dropped again today.  Patient with low platelets. -10/18 transfuse 1 unit platelets  Gastric ulcer - See GI bleed -10/17 octreotide discontinued -Sucralfate 1 g  QID x1 week -Change Protonix infusion to 40 mg IV BID -Will need follow-up EGD in 2 to 3 months  Pneumoperitoneum? -Per Dr. [place] Bells radiology  contacted him believes has pneumoperitoneum -Negative pneumoperitoneum on lateral decubitus x-ray.  Constipation (patient with large stool burden) -Per GI significant residual and stomach - MiraLAX - Senokot  Fluid overload - Strict in and out +4.9 L -Daily weight Filed Weights   08/08/19 0500 08/09/19 0441 08/10/19 0500  Weight: 90.1 kg 95.3 kg 96 kg  - CVP daily -10/18 CVP =  Acute renal failure - Avoid nephrotoxic medication Recent Labs  Lab 08/10/19 1048 08/10/19 1053 08/10/19 1522 08/10/19 1540 08/10/19 1547  CREATININE 2.40* 1.70* 2.30* 2.31* 1.60*  -10/14 renal ultrasound pending -SPEP, UPEP, complements, c-ANCA, urine electrolytes, urine osmolality, serum osmolality, renal ultrasound pending - Spoke with Dr. Justin Mend nephrology will see patient first thing in the a.m. -10/15 Per nephrology begin CRRT  Metabolic acidosis -CRRT  BPH -Flomax 0.4 mg BID    Goals of care - CCP use; counseled Mrs. Carandang that CCP is not improving therapy for COVID-19 though available data shows it may have benefit with high titer plasma is given.  Is unclear if it is effective other patients and with use of low titer plasma.  Risk and benefits of its use were explained to Mrs. Grenier and she agreed to treatment..  All appropriate IRB consent paperwork has been signed  and is in patient's chart.   DVT prophylaxis: Lovenox (ICU dose) Code Status: Full Family Communication: 10/18 Dr. Marchelle Gearing spoke with family.   Disposition Plan: TBD   Consultants:  PCCM   Procedures/Significant Events:  10/2 PCXR;-Endotracheal tube remains with tip above the thoracic inlet. Recommend repositioning and advancement. Esophagogastric tube with tip and side port below the diaphragm. - Unchanged diffuse, extensive bilateral heterogeneous opacity, consistent with multifocal infection, edema, and/or ARDS. 10/3 PCXR; slightly improved extensive bilateral heterogeneous opacifications (my read) 10/3  transfuse 1 unit CCP 10/5 transfuse 1 unit FFP 10/6 transfuse 1 unit CCP (this is patient's second unit) 10/10 PCXR - Personally reviewed: Poor inspiration - mild improvement in aeration 10/11 PCXR - Personally reviewed: Left lower lobe appears to be improving; RLL stable 10/13 PCXR - Personally reviewed: Large R sided pneumothorax - re-expanded after chest tube placed overnight 10/14 PCXR;-small residual right apical pneumothorax, significantly decreased. -Stable extensive patchy opacities throughout both lungs 10/14 DG abdomen decubitus; negative evidence of pneumoperitoneum -Moderate gas in nondependent:; Nonobstructive bowel gas pattern 10/15 s/p bronchoscopy with placement of endobronchial balloon right intermedius bronchi 10/15 renal ultrasound; normal ultrasound 10/15 Per nephrology begin CRRT 10/15 s/p bronchoscopy place endobronchial balloon 10/16 s/p repeat bronchoscopy to adjust endobronchial balloon 10/16 transfuse 1 unit PRBC 10/17 s/p EGD-no active bleeding or blood in the stomach -Very large (700 mL) gastric residual, mostly tube feeding, without any anatomic gastric outlet obstruction, suggestive of gastric ileus  - Gastric ulcer at the angularis of lesser curve of stomach, approximately 1.5 to 2 cm in greatest dimension, cratered, benign-appearing, nonbleeding, with visible vessel but no adherent clot--successfully clipped 10/17 PCXR;-Redemonstrated extensive bilateral slightly nodular airspace opacities. Left basilar/retrocardiac consolidative opacities are unchanged. No new focal airspace opacities.  10/17 KUB;No free air, portal venous gas, or pneumatosis identified on supine<BR>imaging. An NG tube terminates in the stomach. Foreign body projects<BR>over the midline of the lower pelvis. No other abnormalities. 10/18 transfuse 1 unit platelets    I have personally reviewed and interpreted all radiology studies and my findings are as above. VENTILATOR SETTINGS: Vent  mode; PRVC Vt Set; 480 Set rate; 28 FiO2; 60% I time;  PEEP; 12     Cultures 9/29 Novel coronavirus positive 10/2 blood LEFT antecubital NGTD 10/2 blood hand NGTD 10/3 MRSA by PCR negative     Antimicrobials: Anti-infectives (From admission, onward)   Start     Stop   08/07/19 1000  anidulafungin (ERAXIS) 100 mg in sodium chloride 0.9 % 100 mL IVPB         08/07/19 0800  ceFEPIme (MAXIPIME) 2 g in sodium chloride 0.9 % 100 mL IVPB         08/06/19 1000  anidulafungin (ERAXIS) 200 mg in sodium chloride 0.9 % 200 mL IVPB    Note to Pharmacy: please   08/06/19 1514   08/06/19 0800  ceFEPIme (MAXIPIME) 2 g in sodium chloride 0.9 % 100 mL IVPB  Status:  Discontinued     08/06/19 1036   08/02/19 2200  ceFAZolin (ANCEF) IVPB 2g/100 mL premix  Status:  Discontinued     08/06/19 0733   08/02/19 1400  vancomycin (VANCOCIN) 1,250 mg in sodium chloride 0.9 % 250 mL IVPB  Status:  Discontinued     08/02/19 1332   08/01/19 1330  vancomycin (VANCOCIN) 2,000 mg in sodium chloride 0.9 % 500 mL IVPB     08/01/19 1636   08/01/19 1300  ceFEPIme (MAXIPIME) 2 g in sodium chloride 0.9 %  100 mL IVPB  Status:  Discontinued     08/02/19 1332   07/26/19 1600  remdesivir 100 mg in sodium chloride 0.9 % 250 mL IVPB     07/29/19 1823   07/26/19 0215  remdesivir 200 mg in sodium chloride 0.9 % 250 mL IVPB     07/26/19 0520       Devices    LINES / TUBES:  #7.5 ETT 10/2>> RIGHT IJ CVL>>> RIGHT #28 French chest tube 10/13>>> LEFT Ig HD cath 10/14>> RIGHT interlobar bronchial balloon 10/15/>>   Continuous Infusions: .  prismasol BGK 4/2.5 500 mL/hr at 08/10/19 0912  . sodium chloride Stopped (08/01/19 0957)  . sodium chloride 10 mL/hr at 08/09/19 1000  . amiodarone 30 mg/hr (08/10/19 1700)  . calcium gluconate infusion for CRRT 60 mL/hr at 08/10/19 0800  . ceFEPime (MAXIPIME) IV 2 g (08/10/19 0952)  . cisatracurium (NIMBEX) infusion 2 mcg/kg/min (08/10/19 1700)  . epinephrine    .  feeding supplement (PIVOT 1.5 CAL) 20 mL/hr at 08/10/19 0700  . fentaNYL infusion INTRAVENOUS 100 mcg/hr (08/10/19 1700)  . midazolam 5 mg/hr (08/10/19 1357)  . norepinephrine (LEVOPHED) Adult infusion Stopped (08/10/19 0710)  . prismasol BGK 4/2.5 2,000 mL/hr at 08/10/19 1705  . sodium citrate 2 %/dextrose 2.5% solution 3000 mL 330 mL/hr at 08/10/19 1140  . vasopressin (PITRESSIN) infusion - *FOR SHOCK* Stopped (08/10/19 0906)     Objective: Vitals:   08/10/19 1629 08/10/19 1630 08/10/19 1645 08/10/19 1700  BP: (!) 93/57     Pulse: 79 80 81 78  Resp: (!) 30 (!) 30 (!) 30 (!) 30  Temp:      TempSrc:      SpO2: 100% 100% 100% 100%  Weight:      Height:        Intake/Output Summary (Last 24 hours) at 08/10/2019 1714 Last data filed at 08/10/2019 1700 Gross per 24 hour  Intake 4102.95 ml  Output 3775 ml  Net 327.95 ml   Filed Weights   08/08/19 0500 08/09/19 0441 08/10/19 0500  Weight: 90.1 kg 95.3 kg 96 kg   Physical Exam:  General: Sedated/intubated, responds minimally to painful stimuli, positive acute respiratory distress Eyes: negative scleral hemorrhage, negative anisocoria, negative icterus ENT: Negative Runny nose, negative gingival bleeding, #7.5 ETT in place.  Positive Endo bronchial balloon in position negative air leak Neck:  Negative scars, masses, torticollis, lymphadenopathy, JVD Lungs: Tachypneic clear to auscultation bilaterally without wheezes or crackles.  Right-sided chest tube in place negative air leak Cardiovascular: Regular rate and rhythm without murmur gallop or rub normal S1 and S2 Abdomen: negative abdominal pain, nondistended, positive soft, bowel sounds, no rebound, no ascites, no appreciable mass Extremities: No significant cyanosis, clubbing, or edema bilateral lower extremities Skin: Negative rashes, lesions, ulcers Psychiatric: Sedated/intubated   Central nervous system: Responds minimally to painful stimuli            Data  Reviewed: Care during the described time interval was provided by me .  I have reviewed this patient's available data, including medical history, events of note, physical examination, and all test results as part of my evaluation.   CBC: Recent Labs  Lab 08/07/19 0500  08/08/19 0600  08/09/19 0502  08/09/19 1319  08/10/19 0500  08/10/19 1048 08/10/19 1053 08/10/19 1522 08/10/19 1547 08/10/19 1623  WBC 12.1*  --  10.2  --  10.1  --  10.0  --  8.7  --   --   --   --   --   --  NEUTROABS 11.3*  --  9.6*  --  9.2*  --   --   --  7.7  --   --   --   --   --   --   HGB 8.1*   < > 7.2*   < > 7.9*   < > 8.3*   < > 7.3*   < > 7.8* 9.2* 8.2* 9.2* 8.2*  HCT 27.2*   < > 22.8*   < > 24.6*   < > 25.7*   < > 23.1*   < > 23.0* 27.0* 24.0* 27.0* 24.0*  MCV 103.4*  --  97.4  --  94.3  --  95.9  --  97.1  --   --   --   --   --   --   PLT 204  --  187  --  131*  --  128*  --  100*  --   --   --   --   --   --    < > = values in this interval not displayed.   Basic Metabolic Panel: Recent Labs  Lab 08/07/19 0500  08/08/19 0600  08/08/19 1554  08/09/19 0502  08/09/19 1319  08/09/19 1758 08/09/19 2006  08/10/19 0500  08/10/19 1048 08/10/19 1053 08/10/19 1522 08/10/19 1540 08/10/19 1547 08/10/19 1623  NA 146*   < > 144  141   < > 142   < > 144   < > 141   < > 144 143   < > 141  143   < > 138 140 138 139 139 139  K 5.1   < > 4.8  4.7   < > 4.7   < > 5.0   < > 5.2*   < > 4.5 5.8*   < > 4.9  5.0   < > 4.2 4.0 4.2 4.2 4.0 4.3  CL 103   < > 107  105  --  108   < > 109  --  109  --  117* 110   < > 103  104   < > 98 96* 96* 97* 93*  --   CO2 27   < > 25  26  --  23   < > 22  --  21*  --  18* 22  --  25  25  --   --   --   --  30  --   --   GLUCOSE 362*   < > 191*  197*  --  209*   < > 172*  --  194*  --  139* 152*   < > 188*  187*   < > 180* 211* 171* 173* 212*  --   BUN 185*   < > 113*  88*  --  108*   < > 83*  --  94*  --  87* 86*   < > 94*  93*   < > 82* 55* 71* 74* 48*  --    CREATININE 5.44*   < > 3.13*  3.36*  --  3.28*   < > 2.67*  --  2.97*  --  2.65* 3.36*   < > 2.81*  2.82*   < > 2.40* 1.70* 2.30* 2.31* 1.60*  --   CALCIUM 7.1*   < > 7.7*  7.5*  --  7.7*   < > 7.7*  --  7.9*  --  6.4* 7.7*  --  8.5*  8.6*  --   --   --   --  8.8*  --   --   MG 4.0*  --  2.9*  --   --   --  2.8*  --   --   --   --   --   --  2.5*  --   --   --   --   --   --   --   PHOS 11.2*   < > 3.1  3.1  --  3.5  --  4.4  --   --   --  5.8*  --   --  6.1*  6.2*  --   --   --   --  5.6*  --   --    < > = values in this interval not displayed.   GFR: Estimated Creatinine Clearance: 52.8 mL/min (A) (by C-G formula based on SCr of 1.6 mg/dL (H)). Liver Function Tests: Recent Labs  Lab 08/06/19 0610 08/07/19 0500  08/08/19 0600 08/08/19 1554 08/09/19 0502 08/09/19 1758 08/10/19 0500 08/10/19 1540  AST 22 28  --  45*  --  39  --  34  --   ALT 9 6  --  5  --  5  --  9  --   ALKPHOS 45 65  --  93  --  91  --  61  --   BILITOT 1.0 0.3  --  0.5  --  0.5  --  0.3  --   PROT 5.3* 5.0*  --  5.7*  --  5.7*  --  5.6*  --   ALBUMIN 3.2* 2.5*   < > 2.6*  2.8* 2.6* 2.4* 1.9* 2.2*  2.2* 2.3*   < > = values in this interval not displayed.   No results for input(s): LIPASE, AMYLASE in the last 168 hours. No results for input(s): AMMONIA in the last 168 hours. Coagulation Profile: No results for input(s): INR, PROTIME in the last 168 hours. Cardiac Enzymes: No results for input(s): CKTOTAL, CKMB, CKMBINDEX, TROPONINI in the last 168 hours. BNP (last 3 results) No results for input(s): PROBNP in the last 8760 hours. HbA1C: No results for input(s): HGBA1C in the last 72 hours. CBG: Recent Labs  Lab 08/09/19 1253 08/09/19 1950 08/10/19 0055 08/10/19 0316 08/10/19 0645  GLUCAP 184* 136* 218* 151* 177*   Lipid Profile: No results for input(s): CHOL, HDL, LDLCALC, TRIG, CHOLHDL, LDLDIRECT in the last 72 hours. Thyroid Function Tests: No results for input(s): TSH, T4TOTAL, FREET4,  T3FREE, THYROIDAB in the last 72 hours. Anemia Panel: No results for input(s): VITAMINB12, FOLATE, FERRITIN, TIBC, IRON, RETICCTPCT in the last 72 hours. Urine analysis:    Component Value Date/Time   COLORURINE YELLOW 08/01/2019 1745   APPEARANCEUR HAZY (A) 08/01/2019 1745   LABSPEC 1.023 08/01/2019 1745   PHURINE 5.0 08/01/2019 1745   GLUCOSEU NEGATIVE 08/01/2019 1745   HGBUR NEGATIVE 08/01/2019 1745   BILIRUBINUR NEGATIVE 08/01/2019 1745   KETONESUR NEGATIVE 08/01/2019 1745   PROTEINUR NEGATIVE 08/01/2019 1745   NITRITE NEGATIVE 08/01/2019 1745   LEUKOCYTESUR NEGATIVE 08/01/2019 1745   Sepsis Labs: @LABRCNTIP (procalcitonin:4,lacticidven:4)  ) Recent Results (from the past 240 hour(s))  Culture, blood (Routine X 2) w Reflex to ID Panel     Status: Abnormal   Collection Time: 08/01/19 12:15 PM   Specimen: BLOOD  Result Value Ref Range Status   Specimen Description   Final    BLOOD RIGHT  ARM Performed at [facility], [place] [address], [place], [place] [address]    Special Requests   Final    BOTTLES DRAWN AEROBIC AND ANAEROBIC Blood Culture adequate volume Performed at [place] [address], [place], [place] [address]    Culture  Setup Time   Final    GRAM POSITIVE COCCI IN CLUSTERS IN BOTH AEROBIC AND ANAEROBIC BOTTLES CRITICAL RESULT CALLED TO, READ BACK BY AND VERIFIED WITH: C. SHADE PHARMD, AT 6314 08/02/19 BY [place] Performed at [facility], [place] [address], [place], [place] [address]    Culture STAPHYLOCOCCUS AUREUS (A)  Final   Report Status 08/04/2019 FINAL  Final   Organism ID, Bacteria STAPHYLOCOCCUS AUREUS  Final      Susceptibility   Staphylococcus aureus - MIC*    CIPROFLOXACIN <=0.5 SENSITIVE Sensitive     ERYTHROMYCIN <=0.25 SENSITIVE Sensitive     GENTAMICIN <=0.5 SENSITIVE Sensitive     OXACILLIN 0.5 SENSITIVE Sensitive     TETRACYCLINE <=1 SENSITIVE Sensitive     VANCOMYCIN 1 SENSITIVE Sensitive      TRIMETH/SULFA <=10 SENSITIVE Sensitive     CLINDAMYCIN <=0.25 SENSITIVE Sensitive     RIFAMPIN <=0.5 SENSITIVE Sensitive     Inducible Clindamycin NEGATIVE Sensitive     * STAPHYLOCOCCUS AUREUS  Blood Culture ID Panel (Reflexed)     Status: Abnormal   Collection Time: 08/01/19 12:15 PM  Result Value Ref Range Status   Enterococcus species NOT DETECTED NOT DETECTED Final   Listeria monocytogenes NOT DETECTED NOT DETECTED Final   Staphylococcus species DETECTED (A) NOT DETECTED Final    Comment: CRITICAL RESULT CALLED TO, READ BACK BY AND VERIFIED WITH: C. SHADE PHARMD, AT 3785 08/02/19 BY D. VANHOOK    Staphylococcus aureus (BCID) DETECTED (A) NOT DETECTED Final    Comment: Methicillin (oxacillin) susceptible Staphylococcus aureus (MSSA). Preferred therapy is anti staphylococcal beta lactam antibiotic (Cefazolin or Nafcillin), unless clinically contraindicated. CRITICAL RESULT CALLED TO, READ BACK BY AND VERIFIED WITH: C. SHADE PHARMD, AT 8850 08/02/19 BY D. VANHOOK    Methicillin resistance NOT DETECTED NOT DETECTED Final   Streptococcus species NOT DETECTED NOT DETECTED Final   Streptococcus agalactiae NOT DETECTED NOT DETECTED Final   Streptococcus pneumoniae NOT DETECTED NOT DETECTED Final   Streptococcus pyogenes NOT DETECTED NOT DETECTED Final   Acinetobacter baumannii NOT DETECTED NOT DETECTED Final   Enterobacteriaceae species NOT DETECTED NOT DETECTED Final   Enterobacter cloacae complex NOT DETECTED NOT DETECTED Final   Escherichia coli NOT DETECTED NOT DETECTED Final   Klebsiella oxytoca NOT DETECTED NOT DETECTED Final   Klebsiella pneumoniae NOT DETECTED NOT DETECTED Final   Proteus species NOT DETECTED NOT DETECTED Final   Serratia marcescens NOT DETECTED NOT DETECTED Final   Haemophilus influenzae NOT DETECTED NOT DETECTED Final   Neisseria meningitidis NOT DETECTED NOT DETECTED Final   Pseudomonas aeruginosa NOT DETECTED NOT DETECTED Final   Candida albicans NOT  DETECTED NOT DETECTED Final   Candida glabrata NOT DETECTED NOT DETECTED Final   Candida krusei NOT DETECTED NOT DETECTED Final   Candida parapsilosis NOT DETECTED NOT DETECTED Final   Candida tropicalis NOT DETECTED NOT DETECTED Final    Comment: Performed at [facility], [place] [address] [place], [place] [address]  Culture, blood (Routine X 2) w Reflex to ID Panel     Status: Abnormal   Collection Time: 08/01/19 12:21 PM   Specimen: BLOOD  Result Value Ref Range Status  Specimen Description   Final    BLOOD RIGHT HAND Performed at [place] [address], [place], [place] [address]    Special Requests   Final    BOTTLES DRAWN AEROBIC AND ANAEROBIC Blood Culture adequate volume Performed at [place] [address], [place], [place] [address]    Culture  Setup Time   Final    GRAM POSITIVE COCCI IN CLUSTERS IN BOTH AEROBIC AND ANAEROBIC BOTTLES CRITICAL RESULT CALLED TO, READ BACK BY AND VERIFIED WITH: C. SHADE PHARMD, AT 2706 08/02/19 BY D. VANHOOK    Culture (A)  Final    STAPHYLOCOCCUS AUREUS SUSCEPTIBILITIES PERFORMED ON PREVIOUS CULTURE WITHIN THE LAST 5 DAYS. Performed at [facility], [place] [address] [place], [place] [address]    Report Status 08/04/2019 FINAL  Final  Culture, respiratory     Status: None   Collection Time: 08/01/19  3:30 PM   Specimen: Tracheal Aspirate  Result Value Ref Range Status   Specimen Description   Final    TRACHEAL ASPIRATE Performed at [place] [address], [place], [place] [address]    Special Requests   Final    NONE Performed at [facility], [place] [address], [place], [place] [address]    Gram Stain   Final    RARE WBC PRESENT, PREDOMINANTLY PMN ABUNDANT GRAM POSITIVE COCCI IN CLUSTERS Performed at [facility], [place] [address] [place], [place] [address]    Culture   Final    MODERATE STAPHYLOCOCCUS AUREUS RARE  PSEUDOMONAS AERUGINOSA    Report Status 08/06/2019 FINAL  Final   Organism ID, Bacteria STAPHYLOCOCCUS AUREUS  Final   Organism ID, Bacteria PSEUDOMONAS AERUGINOSA  Final      Susceptibility   Pseudomonas aeruginosa - MIC*    CEFTAZIDIME 4 SENSITIVE Sensitive     CIPROFLOXACIN 0.5 SENSITIVE Sensitive     GENTAMICIN <=1 SENSITIVE Sensitive     IMIPENEM 1 SENSITIVE Sensitive     PIP/TAZO 16 SENSITIVE Sensitive     CEFEPIME 4 SENSITIVE Sensitive     * RARE PSEUDOMONAS AERUGINOSA   Staphylococcus aureus - MIC*    CIPROFLOXACIN <=0.5 SENSITIVE Sensitive     ERYTHROMYCIN <=0.25 SENSITIVE Sensitive     GENTAMICIN <=0.5 SENSITIVE Sensitive     OXACILLIN 0.5 SENSITIVE Sensitive     TETRACYCLINE <=1 SENSITIVE Sensitive     VANCOMYCIN <=0.5 SENSITIVE Sensitive     TRIMETH/SULFA <=10 SENSITIVE Sensitive     CLINDAMYCIN <=0.25 SENSITIVE Sensitive     RIFAMPIN <=0.5 SENSITIVE Sensitive     Inducible Clindamycin NEGATIVE Sensitive     * MODERATE STAPHYLOCOCCUS AUREUS  Culture, Urine     Status: Abnormal   Collection Time: 08/01/19  5:45 PM   Specimen: Urine, Catheterized  Result Value Ref Range Status   Specimen Description   Final    URINE, CATHETERIZED Performed at [facility] [address], [place], [place] [address]    Special Requests   Final    NONE Performed at [facility], [place] [address], [place], [place] [address]    Culture (A)  Final    <10,000 COLONIES/mL INSIGNIFICANT GROWTH Performed at [facility] [address], [place], [place] [address]    Report Status 08/03/2019 FINAL  Final  MRSA PCR Screening     Status: None   Collection Time: 08/02/19 10:02 AM   Specimen: Nasal Mucosa; Nasopharyngeal  Result  Value Ref Range Status   MRSA by PCR NEGATIVE NEGATIVE Final    Comment:        The GeneXpert MRSA Assay (FDA approved for NASAL specimens only), is one component of a comprehensive MRSA colonization surveillance  program. It is not intended to diagnose MRSA infection nor to guide or monitor treatment for MRSA infections. Performed at [facility], [place] [address], [place], [place] [address]   Culture, blood (routine x 2)     Status: None   Collection Time: 08/05/19  5:00 AM   Specimen: BLOOD RIGHT HAND  Result Value Ref Range Status   Specimen Description   Final    BLOOD RIGHT HAND Performed at [facility] [address], [place], [place] [address]    Special Requests   Final    BOTTLES DRAWN AEROBIC ONLY Blood Culture adequate volume Performed at [facility] [address], [place], [place] [address]    Culture   Final    NO GROWTH 5 DAYS Performed at [facility], [place] [address] [place], [place] [address]    Report Status 08/10/2019 FINAL  Final  Culture, blood (routine x 2)     Status: None   Collection Time: 08/05/19  5:05 AM   Specimen: BLOOD LEFT HAND  Result Value Ref Range Status   Specimen Description   Final    BLOOD LEFT HAND Performed at [facility] [address] [place], [place] [address]    Special Requests   Final    BOTTLES DRAWN AEROBIC ONLY Blood Culture adequate volume Performed at [facility] [address], [place], [place] [address]    Culture   Final    NO GROWTH 5 DAYS Performed at [facility], [place] [address], [place], [place] [address]    Report Status 08/10/2019 FINAL  Final         Radiology Studies: Dg Abd 1 View  Result Date: 08/09/2019 CLINICAL DATA:  ARDS from COVID-19. EXAM: ABDOMEN - 1 VIEW COMPARISON:  August 06, 2019 FINDINGS: No free air, portal venous gas, or pneumatosis identified on supine imaging. An NG tube terminates in the stomach. Foreign body projects over the midline of the lower pelvis. No other abnormalities. IMPRESSION: 1. The foreign body projecting over the pelvis may be on or in the patient. Recommend  clinical correlation. 2. No other acute abnormalities. Electronically Signed   By: Dorise Bullion III M.D   On: 08/09/2019 18:50   Dg Chest Port 1 View  Result Date: 08/10/2019 CLINICAL DATA:  COVID positive EXAM: PORTABLE CHEST 1 VIEW COMPARISON:  08/09/2019 FINDINGS: Left lung apex and lateral costophrenic angle are partially visualized. Endotracheal tube terminates 5 cm above the carina. Right chest tube.  No pneumothorax is seen. Left IJ dual lumen catheter terminates in the mid SVC. Right subclavian venous catheter terminates at the cavoatrial junction. Enteric tube terminates in the proximal stomach. Stable selective balloon occlusion catheter in the right lower lobe bronchus. Multifocal patchy opacities, lower lung predominant, grossly unchanged. No definite pleural effusions. The heart is normal in size. IMPRESSION: Multifocal patchy opacities, grossly unchanged, compatible with pneumonia in this patient with known COVID. Stable right chest tube.  No pneumothorax is seen. Endotracheal tube terminates 5 cm above the carina. Additional stable support apparatus as above. Electronically Signed   By: Julian Hy M.D.   On: 08/10/2019 16:59   Dg Chest [facility]  Result Date: 08/09/2019 CLINICAL DATA:  Right-sided pneumothorax. Acute respiratory failure. COVID-19. EXAM: PORTABLE CHEST 1 VIEW COMPARISON:  08/08/2019; 08/07/2019; chest CT-07/25/2019 FINDINGS: Grossly unchanged cardiac silhouette and mediastinal contours. Stable positioning of support apparatus with endotracheal tube overlying tracheal air column with tip superior to the carina, left jugular approach central venous catheter tips projected over the superior SVC, right subclavian approach central venous catheter tip projects over the superior caval junction, enteric tube tip and side port projected over the gastric antrum and right-sided chest tube and selective balloon occlusion catheter involving the right mainstem bronchus. No  pneumothorax. Redemonstrated extensive bilateral slightly nodular airspace opacities. Left basilar/retrocardiac consolidative opacities are unchanged. No new focal airspace opacities. No definite pleural effusion. No acute osseous abnormalities. IMPRESSION: 1.  Stable positioning of support apparatus.  No pneumothorax. 2. No significant change in extensive bilateral airspace opacities compatible with provided history of COVID-19 infection. Electronically Signed   By: Sandi Mariscal M.D.   On: 08/09/2019 05:18        Scheduled Meds: . sodium chloride   Intravenous Once  . sodium chloride   Intravenous Once  . artificial tears  1 application Both Eyes C6C  . chlorhexidine  15 mL Mouth/Throat BID  . Chlorhexidine Gluconate Cloth  6 each Topical Daily  . feeding supplement (PRO-STAT SUGAR FREE 64)  60 mL Per Tube TID  . free water  125 mL Per Tube Q6H  . [START ON 08/11/2019] heparin injection (subcutaneous)  5,000 Units Subcutaneous Q8H  . [START ON 08/11/2019] hydrocortisone sodium succinate  50 mg Intravenous Q8H  . insulin aspart  0-9 Units Subcutaneous Q4H  . magnesium citrate  0.5 Bottle Per Tube Once  . mouth rinse  15 mL Mouth Rinse 10 times per day  . oxyCODONE  5 mg Oral Q6H  . pantoprazole (PROTONIX) IV  40 mg Intravenous Q12H  . polyethylene glycol  17 g Per Tube BID  . QUEtiapine  25 mg Oral BID  . sennosides  5 mL Per Tube BID  . sucralfate  1 g Oral Q6H  . tamsulosin  0.4 mg Oral BID   Continuous Infusions: .  prismasol BGK 4/2.5 500 mL/hr at 08/10/19 0912  . sodium chloride Stopped (08/01/19 0957)  . sodium chloride 10 mL/hr at 08/09/19 1000  . amiodarone 30 mg/hr (08/10/19 1700)  . calcium gluconate infusion for CRRT 60 mL/hr at 08/10/19 0800  . ceFEPime (MAXIPIME) IV 2 g (08/10/19 0952)  . cisatracurium (NIMBEX) infusion 2 mcg/kg/min (08/10/19 1700)  . epinephrine    . feeding supplement (PIVOT 1.5 CAL) 20 mL/hr at 08/10/19 0700  . fentaNYL infusion INTRAVENOUS 100  mcg/hr (08/10/19 1700)  . midazolam 5 mg/hr (08/10/19 1357)  . norepinephrine (LEVOPHED) Adult infusion Stopped (08/10/19 0710)  . prismasol BGK 4/2.5 2,000 mL/hr at 08/10/19 1705  . sodium citrate 2 %/dextrose 2.5% solution 3000 mL 330 mL/hr at 08/10/19 1140  . vasopressin (PITRESSIN) infusion - *FOR SHOCK* Stopped (08/10/19 0906)     LOS: 16 days   The patient is critically ill with multiple organ systems failure and requires high complexity decision making for assessment and support, frequent evaluation and titration of therapies, application of advanced monitoring technologies and extensive interpretation of multiple databases. Critical Care Time devoted to patient care services described in this note  Time spent: 40 minutes     WOODS, Geraldo Docker, MD [facility] Pager [phone]  If 7PM-7AM, please contact night-coverage [email or web address] Password [email or web address] 08/10/2019, 5:14 PM

## 2019-08-10 NOTE — Progress Notes (Addendum)
0200 norepi is off  0400 norepi restarted

## 2019-08-10 NOTE — Progress Notes (Signed)
Spoke with patient's wife, [name], and provided full update/answered all questions.

## 2019-08-10 NOTE — Progress Notes (Signed)
(  Chart rounds; patient not seen in person)  PLEASE SEE MY NOTE FROM YESTERDAY EVENING.  Per discussion with RN the telephone this morning, the patient continues to have small amounts of dark stool per rectal tube, nothing to suggest active rebleeding.  The patient's hemoglobin is bouncing all around, but it does appear there is a slight downward trend, most recently 7.3 on his CBC this morning, as compared to 7.9 yesterday morning.  On balance, I tend to think that the patient is not having further bleeding.  Recommendations:  1.  My recommendations are unchanged from yesterday (please see my note)  2.  At this time, I do not think I have anything further to add to patient's management, so I will sign off, but I would be very happy to discuss his case with his care providers if desired.  3.  If the patient shows signs of ongoing blood loss, I would consider alternative sources in the GI tract, since hemostasis is probably quite secure from yesterday's clipping of his ulcer during endoscopy.  However, it is possible for clips to fall off, or for new ulcers to form, so if there is active or acute hemorrhage in the future, please call us back.  And please call us back if there are questions or we can be of further assistance in his care.  Cleotis Nipper, M.D. Pager [phone] If no answer or after 5 PM call [phone]

## 2019-08-10 NOTE — Progress Notes (Signed)
[facility] ROUNDING NOTE   Subjective:   This is a 68 year old gentleman who was admitted with Covid pneumonia July 13, 2019 completed course of remdesivir convalescent plasma Decadron treated with vancomycin and cefepime from 08/01/2019 until 08/02/2019.  On 08/02/2019 patient developed an ileus.  His creatinine on 07/25/2019 was 1.5 which appears to be his baseline.  On 08/02/2019 started to increase.  On 08/02/2019 was found to have MSSA bacteremia and was started on Ancef.  His blood pressure has been marginal with drops in blood pressure to 70 mmHg.  His renal ultrasound showed no hydronephrosis on 08/07/2019. Pt had administration of intravenous contrast on 07/25/2019.  Blood pressure labile to low, FiO2 50%, peep 10, appears to be getting still two pressors.   Pt had EGD yest  w/ epi Rx of an ulcer.   Oliguric renal failure.  Continues on CRRT with no fluid off. D#4  Filter clotting improved on citrate a/c.    Objective:  Vital signs in last 24 hours:  Temp:  [95.2 F (35.1 C)-98.2 F (36.8 C)] 96.3 F (35.7 C) (10/18 1330) Pulse Rate:  [59-82] 81 (10/18 1530) Resp:  [14-30] 14 (10/18 1530) BP: (99-118)/(64-74) 103/68 (10/18 1200) SpO2:  [85 %-100 %] 95 % (10/18 1530) Arterial Line BP: (89-172)/(48-73) 89/51 (10/18 1530) FiO2 (%):  [40 %-60 %] 40 % (10/18 1101) Weight:  [96 kg] 96 kg (10/18 0500)  Exam:   Patient not examined directly given COVID-19 + status, utilizing exam of the primary team and observations of RN's.   Assessment/ Plan:   Acute kidney injury with a baseline of chronic kidney disease with baseline serum creatinine about 1.5 mg/dL.  Admitted with Covid pneumonia complicated course with recent history of MSSA bacteremia.  Developed shock hypotension and required pressors.  No evidence of hydronephrosis on renal ultrasound.  This suggests ATN.  Could not identify any additional nephrotoxins. There was administration of contrast but this was  early on the course of his illness on 07/25/2019. He developed progressive metabolic acidosis and was started on CRRT 08/07/2019. This is D#4.   With citrate local a/c protocol filter clotting issues have improved  Total I/O and wt's are mostly stable/ even, will continue to keep even as tolerated, have d/w primary MD  Metabolic acidosis- resolved  Anemia -as per primary team transfuse as necessary no transfusion requirements at this time  COVID-19 pneumonia-  Treated with remdesivir, dexamethasone and convalescent plasma.  Shock - weaning pressors down, possibly off today  MSSA bacteremia-  vancomycin and anidulafungin have been discontinued.  Continues on cefepime   LOS: 15 Sandy Salaam Crislyn Willbanks @TODAY @8 :11 PM   Basic Metabolic Panel: Recent Labs  Lab 08/07/19 0500  08/08/19 0600  08/08/19 1554  08/09/19 0502  08/09/19 1319  08/09/19 1758 08/09/19 2006  08/10/19 0500  08/10/19 0652 08/10/19 0747 08/10/19 1048 08/10/19 1053 08/10/19 1522 08/10/19 1547  NA 146*   < > 144  141   < > 142   < > 144   < > 141   < > 144 143   < > 141  143   < > 139 140 138 140 138 139  K 5.1   < > 4.8  4.7   < > 4.7   < > 5.0   < > 5.2*   < > 4.5 5.8*   < > 4.9  5.0   < > 4.6 4.2 4.2 4.0 4.2 4.0  CL 103   < > 107  105  --  108   < > 109  --  109  --  117* 110   < > 103  104   < > 102  --  98 96* 96* 93*  CO2 27   < > 25  26  --  23   < > 22  --  21*  --  18* 22  --  25  25  --   --   --   --   --   --   --   GLUCOSE 362*   < > 191*  197*  --  209*   < > 172*  --  194*  --  139* 152*   < > 188*  187*   < > 183*  --  180* 211* 171* 212*  BUN 185*   < > 113*  88*  --  108*   < > 83*  --  94*  --  87* 86*   < > 94*  93*   < > 88*  --  82* 55* 71* 48*  CREATININE 5.44*   < > 3.13*  3.36*  --  3.28*   < > 2.67*  --  2.97*  --  2.65* 3.36*   < > 2.81*  2.82*   < > 2.70*  --  2.40* 1.70* 2.30* 1.60*  CALCIUM 7.1*   < > 7.7*  7.5*  --  7.7*   < > 7.7*  --  7.9*  --  6.4* 7.7*  --  8.5*  8.6*   --   --   --   --   --   --   --   MG 4.0*  --  2.9*  --   --   --  2.8*  --   --   --   --   --   --  2.5*  --   --   --   --   --   --   --   PHOS 11.2*   < > 3.1  3.1  --  3.5  --  4.4  --   --   --  5.8*  --   --  6.1*  6.2*  --   --   --   --   --   --   --    < > = values in this interval not displayed.    Liver Function Tests: Recent Labs  Lab 08/06/19 0610 08/07/19 0500  08/08/19 0600 08/08/19 1554 08/09/19 0502 08/09/19 1758 08/10/19 0500  AST 22 28  --  45*  --  39  --  34  ALT 9 6  --  5  --  5  --  9  ALKPHOS 45 65  --  93  --  91  --  61  BILITOT 1.0 0.3  --  0.5  --  0.5  --  0.3  PROT 5.3* 5.0*  --  5.7*  --  5.7*  --  5.6*  ALBUMIN 3.2* 2.5*   < > 2.6*  2.8* 2.6* 2.4* 1.9* 2.2*  2.2*   < > = values in this interval not displayed.   No results for input(s): LIPASE, AMYLASE in the last 168 hours. No results for input(s): AMMONIA in the last 168 hours.  CBC: Recent Labs  Lab 08/07/19 0500  08/08/19 0600  08/09/19 0502  08/09/19 1319  08/10/19 0500  08/10/19 1044 08/10/19 1048 08/10/19 1053 08/10/19  1522 08/10/19 1547  WBC 12.1*  --  10.2  --  10.1  --  10.0  --  8.7  --   --   --   --   --   --   NEUTROABS 11.3*  --  9.6*  --  9.2*  --   --   --  7.7  --   --   --   --   --   --   HGB 8.1*   < > 7.2*   < > 7.9*   < > 8.3*   < > 7.3*   < > 7.3* 7.8* 9.2* 8.2* 9.2*  HCT 27.2*   < > 22.8*   < > 24.6*   < > 25.7*   < > 23.1*   < > 23.0* 23.0* 27.0* 24.0* 27.0*  MCV 103.4*  --  97.4  --  94.3  --  95.9  --  97.1  --   --   --   --   --   --   PLT 204  --  187  --  131*  --  128*  --  100*  --   --   --   --   --   --    < > = values in this interval not displayed.    Cardiac Enzymes: No results for input(s): CKTOTAL, CKMB, CKMBINDEX, TROPONINI in the last 168 hours.  BNP: Invalid input(s): POCBNP  CBG: Recent Labs  Lab 08/09/19 1253 08/09/19 1950 08/10/19 0055 08/10/19 0316 08/10/19 0645  GLUCAP 184* 136* 218* 151* 177*     Microbiology: Results for orders placed or performed during the hospital encounter of 07/25/19  Blood Culture (routine x 2)     Status: None   Collection Time: 07/25/19  4:56 PM   Specimen: Left Antecubital; Blood  Result Value Ref Range Status   Specimen Description LEFT ANTECUBITAL  Final   Special Requests   Final    BOTTLES DRAWN AEROBIC AND ANAEROBIC Blood Culture results may not be optimal due to an excessive volume of blood received in culture bottles   Culture   Final    NO GROWTH 5 DAYS Performed at [facility], [address], [place], [place] [address]    Report Status 07/30/2019 FINAL  Final  Blood Culture (routine x 2)     Status: None   Collection Time: 07/25/19  5:01 PM   Specimen: BLOOD RIGHT HAND  Result Value Ref Range Status   Specimen Description BLOOD RIGHT HAND  Final   Special Requests   Final    BOTTLES DRAWN AEROBIC AND ANAEROBIC Blood Culture adequate volume   Culture   Final    NO GROWTH 5 DAYS Performed at [facility] [address] [place], [place] [address]    Report Status 07/30/2019 FINAL  Final  MRSA PCR Screening     Status: None   Collection Time: 07/26/19  2:09 AM   Specimen: Nasal Mucosa; Nasopharyngeal  Result Value Ref Range Status   MRSA by PCR NEGATIVE NEGATIVE Final    Comment:        The GeneXpert MRSA Assay (FDA approved for NASAL specimens only), is one component of a comprehensive MRSA colonization surveillance program. It is not intended to diagnose MRSA infection nor to guide or monitor treatment for MRSA infections. Performed at [facility], [place] [address], [place], [place] [address]   Culture, blood (Routine X 2) w Reflex to ID  Panel     Status: Abnormal   Collection Time: 08/01/19 12:15 PM   Specimen: BLOOD  Result Value Ref Range Status   Specimen Description   Final    BLOOD RIGHT ARM Performed at [facility] [address] [place], [place] [address]    Special  Requests   Final    BOTTLES DRAWN AEROBIC AND ANAEROBIC Blood Culture adequate volume Performed at [facility] [address] [place], [place] [address]    Culture  Setup Time   Final    GRAM POSITIVE COCCI IN CLUSTERS IN BOTH AEROBIC AND ANAEROBIC BOTTLES CRITICAL RESULT CALLED TO, READ BACK BY AND VERIFIED WITH: C. SHADE PHARMD, AT 8546 08/02/19 BY Rush [place]mark Performed at [facility], [place] [address], [place], [place] [address]    Culture STAPHYLOCOCCUS AUREUS (A)  Final   Report Status 08/04/2019 FINAL  Final   Organism ID, Bacteria STAPHYLOCOCCUS AUREUS  Final      Susceptibility   Staphylococcus aureus - MIC*    CIPROFLOXACIN <=0.5 SENSITIVE Sensitive     ERYTHROMYCIN <=0.25 SENSITIVE Sensitive     GENTAMICIN <=0.5 SENSITIVE Sensitive     OXACILLIN 0.5 SENSITIVE Sensitive     TETRACYCLINE <=1 SENSITIVE Sensitive     VANCOMYCIN 1 SENSITIVE Sensitive     TRIMETH/SULFA <=10 SENSITIVE Sensitive     CLINDAMYCIN <=0.25 SENSITIVE Sensitive     RIFAMPIN <=0.5 SENSITIVE Sensitive     Inducible Clindamycin NEGATIVE Sensitive     * STAPHYLOCOCCUS AUREUS  Blood Culture ID Panel (Reflexed)     Status: Abnormal   Collection Time: 08/01/19 12:15 PM  Result Value Ref Range Status   Enterococcus species NOT DETECTED NOT DETECTED Final   Listeria monocytogenes NOT DETECTED NOT DETECTED Final   Staphylococcus species DETECTED (A) NOT DETECTED Final    Comment: CRITICAL RESULT CALLED TO, READ BACK BY AND VERIFIED WITH: C. SHADE PHARMD, AT 0093 08/02/19 BY D. VANHOOK    Staphylococcus aureus (BCID) DETECTED (A) NOT DETECTED Final    Comment: Methicillin (oxacillin) susceptible Staphylococcus aureus (MSSA). Preferred therapy is anti staphylococcal beta lactam antibiotic (Cefazolin or Nafcillin), unless clinically contraindicated. CRITICAL RESULT CALLED TO, READ BACK BY AND VERIFIED WITH: C. SHADE PHARMD, AT 8182 08/02/19 BY D. VANHOOK    Methicillin resistance NOT  DETECTED NOT DETECTED Final   Streptococcus species NOT DETECTED NOT DETECTED Final   Streptococcus agalactiae NOT DETECTED NOT DETECTED Final   Streptococcus pneumoniae NOT DETECTED NOT DETECTED Final   Streptococcus pyogenes NOT DETECTED NOT DETECTED Final   Acinetobacter baumannii NOT DETECTED NOT DETECTED Final   Enterobacteriaceae species NOT DETECTED NOT DETECTED Final   Enterobacter cloacae complex NOT DETECTED NOT DETECTED Final   Escherichia coli NOT DETECTED NOT DETECTED Final   Klebsiella oxytoca NOT DETECTED NOT DETECTED Final   Klebsiella pneumoniae NOT DETECTED NOT DETECTED Final   Proteus species NOT DETECTED NOT DETECTED Final   Serratia marcescens NOT DETECTED NOT DETECTED Final   Haemophilus influenzae NOT DETECTED NOT DETECTED Final   Neisseria meningitidis NOT DETECTED NOT DETECTED Final   Pseudomonas aeruginosa NOT DETECTED NOT DETECTED Final   Candida albicans NOT DETECTED NOT DETECTED Final   Candida glabrata NOT DETECTED NOT DETECTED Final   Candida krusei NOT DETECTED NOT DETECTED Final   Candida parapsilosis NOT DETECTED NOT DETECTED Final   Candida tropicalis NOT DETECTED NOT DETECTED Final    Comment: Performed at [facility], [place],  [place] [id number]  Culture, blood (Routine X 2) w Reflex to ID Panel     Status: Abnormal   Collection Time: 08/01/19 12:21 PM   Specimen: BLOOD  Result Value Ref Range Status   Specimen Description   Final    BLOOD RIGHT HAND Performed at [facility] [address] [place], [place] [address]    Special Requests   Final    BOTTLES DRAWN AEROBIC AND ANAEROBIC Blood Culture adequate volume Performed at [facility] [address], [place], [place] [address]    Culture  Setup Time   Final    GRAM POSITIVE COCCI IN CLUSTERS IN BOTH AEROBIC AND ANAEROBIC BOTTLES CRITICAL RESULT CALLED TO, READ BACK BY AND VERIFIED WITH: C. SHADE PHARMD, AT 4081 08/02/19 BY D.  VANHOOK    Culture (A)  Final    STAPHYLOCOCCUS AUREUS SUSCEPTIBILITIES PERFORMED ON PREVIOUS CULTURE WITHIN THE LAST 5 DAYS. Performed at [facility], [place] [address] [place], [place] [address]    Report Status 08/04/2019 FINAL  Final  Culture, respiratory     Status: None   Collection Time: 08/01/19  3:30 PM   Specimen: Tracheal Aspirate  Result Value Ref Range Status   Specimen Description   Final    TRACHEAL ASPIRATE Performed at [facility] [address] [place], [place] [address]    Special Requests   Final    NONE Performed at [facility], [place] [address] [place], [place] [address]    Gram Stain   Final    RARE WBC PRESENT, PREDOMINANTLY PMN ABUNDANT GRAM POSITIVE COCCI IN CLUSTERS Performed at [facility], [place] [address] [place], [place] [address]    Culture   Final    MODERATE STAPHYLOCOCCUS AUREUS RARE PSEUDOMONAS AERUGINOSA    Report Status 08/06/2019 FINAL  Final   Organism ID, Bacteria STAPHYLOCOCCUS AUREUS  Final   Organism ID, Bacteria PSEUDOMONAS AERUGINOSA  Final      Susceptibility   Pseudomonas aeruginosa - MIC*    CEFTAZIDIME 4 SENSITIVE Sensitive     CIPROFLOXACIN 0.5 SENSITIVE Sensitive     GENTAMICIN <=1 SENSITIVE Sensitive     IMIPENEM 1 SENSITIVE Sensitive     PIP/TAZO 16 SENSITIVE Sensitive     CEFEPIME 4 SENSITIVE Sensitive     * RARE PSEUDOMONAS AERUGINOSA   Staphylococcus aureus - MIC*    CIPROFLOXACIN <=0.5 SENSITIVE Sensitive     ERYTHROMYCIN <=0.25 SENSITIVE Sensitive     GENTAMICIN <=0.5 SENSITIVE Sensitive     OXACILLIN 0.5 SENSITIVE Sensitive     TETRACYCLINE <=1 SENSITIVE Sensitive     VANCOMYCIN <=0.5 SENSITIVE Sensitive     TRIMETH/SULFA <=10 SENSITIVE Sensitive     CLINDAMYCIN <=0.25 SENSITIVE Sensitive     RIFAMPIN <=0.5 SENSITIVE Sensitive     Inducible Clindamycin NEGATIVE Sensitive     * MODERATE STAPHYLOCOCCUS AUREUS  Culture, Urine     Status: Abnormal    Collection Time: 08/01/19  5:45 PM   Specimen: Urine, Catheterized  Result Value Ref Range Status   Specimen Description   Final    URINE, CATHETERIZED Performed at [facility] [address] [place], [place] [address]    Special Requests   Final    NONE Performed at [facility], [place] [address], [place],  [address]    Culture (A)  Final    <10,000 COLONIES/mL INSIGNIFICANT GROWTH Performed at [facility]  [address] [place], [place] [address]    Report Status 08/03/2019 FINAL  Final  MRSA PCR Screening     Status: None   Collection Time: 08/02/19 10:02 AM   Specimen: Nasal Mucosa; Nasopharyngeal  Result Value Ref Range Status   MRSA by PCR NEGATIVE NEGATIVE Final    Comment:        The GeneXpert MRSA Assay (FDA approved for NASAL specimens only), is one component of a comprehensive MRSA colonization surveillance program. It is not intended to diagnose MRSA infection nor to guide or monitor treatment for MRSA infections. Performed at [facility], [place] [address] [place], [place] [address]   Culture, blood (routine x 2)     Status: None   Collection Time: 08/05/19  5:00 AM   Specimen: BLOOD RIGHT HAND  Result Value Ref Range Status   Specimen Description   Final    BLOOD RIGHT HAND Performed at [facility] [address] [place], [place] [address]    Special Requests   Final    BOTTLES DRAWN AEROBIC ONLY Blood Culture adequate volume Performed at [facility] [address] [place], [place] [address]    Culture   Final    NO GROWTH 5 DAYS Performed at [facility], [place] [address], [place], [place] [address]    Report Status 08/10/2019 FINAL  Final  Culture, blood (routine x 2)     Status: None   Collection Time: 08/05/19  5:05 AM   Specimen: BLOOD LEFT HAND  Result Value Ref Range Status   Specimen Description   Final    BLOOD LEFT  HAND Performed at [facility] [address], [place], [place] [address]    Special Requests   Final    BOTTLES DRAWN AEROBIC ONLY Blood Culture adequate volume Performed at [facility] [address], [place],  [address]    Culture   Final    NO GROWTH 5 DAYS Performed at [facility], [place] [address] [place],  [address]    Report Status 08/10/2019 FINAL  Final    Coagulation Studies: No results for input(s): LABPROT, INR in the last 72 hours.  Urinalysis: No results for input(s): COLORURINE, LABSPEC, PHURINE, GLUCOSEU, HGBUR, BILIRUBINUR, KETONESUR, PROTEINUR, UROBILINOGEN, NITRITE, LEUKOCYTESUR in the last 72 hours.  Invalid input(s): APPERANCEUR    Imaging: Dg Abd 1 View  Result Date: 08/09/2019 CLINICAL DATA:  ARDS from COVID-19. EXAM: ABDOMEN - 1 VIEW COMPARISON:  August 06, 2019 FINDINGS: No free air, portal venous gas, or pneumatosis identified on supine imaging. An NG tube terminates in the stomach. Foreign body projects over the midline of the lower pelvis. No other abnormalities. IMPRESSION: 1. The foreign body projecting over the pelvis may be on or in the patient. Recommend clinical correlation. 2. No other acute abnormalities. Electronically Signed   By: Dorise Bullion III M.D   On: 08/09/2019 18:50   Dg Chest Port 1 View  Result Date: 08/09/2019 CLINICAL DATA:  Right-sided pneumothorax. Acute respiratory failure. COVID-19. EXAM: PORTABLE CHEST 1 VIEW COMPARISON:  08/08/2019; 08/07/2019; chest CT-07/25/2019 FINDINGS: Grossly unchanged cardiac silhouette and mediastinal contours. Stable positioning of support apparatus with endotracheal tube overlying tracheal air column with tip superior to the carina, left jugular approach central venous catheter tips projected over the superior SVC, right subclavian approach central venous catheter tip projects over the superior caval junction, enteric tube tip and side  port projected over the  gastric antrum and right-sided chest tube and selective balloon occlusion catheter involving the right mainstem bronchus. No pneumothorax. Redemonstrated extensive bilateral slightly nodular airspace opacities. Left basilar/retrocardiac consolidative opacities are unchanged. No new focal airspace opacities. No definite pleural effusion. No acute osseous abnormalities. IMPRESSION: 1.  Stable positioning of support apparatus.  No pneumothorax. 2. No significant change in extensive bilateral airspace opacities compatible with provided history of COVID-19 infection. Electronically Signed   By: Sandi Mariscal M.D.   On: 08/09/2019 05:18     Medications:   .  prismasol BGK 4/2.5 500 mL/hr at 08/10/19 0912  . sodium chloride Stopped (08/01/19 0957)  . sodium chloride 10 mL/hr at 08/09/19 1000  . amiodarone 30 mg/hr (08/10/19 1600)  . calcium gluconate infusion for CRRT 60 mL/hr at 08/10/19 0800  . ceFEPime (MAXIPIME) IV 2 g (08/10/19 0952)  . cisatracurium (NIMBEX) infusion 2 mcg/kg/min (08/10/19 1600)  . epinephrine    . feeding supplement (PIVOT 1.5 CAL) 20 mL/hr at 08/10/19 0700  . fentaNYL infusion INTRAVENOUS 100 mcg/hr (08/10/19 1600)  . midazolam 5 mg/hr (08/10/19 1357)  . norepinephrine (LEVOPHED) Adult infusion Stopped (08/10/19 0710)  . pantoprozole (PROTONIX) infusion 8 mg/hr (08/10/19 1600)  . prismasol BGK 4/2.5 2,000 mL/hr at 08/10/19 1147  . sodium citrate 2 %/dextrose 2.5% solution 3000 mL 330 mL/hr at 08/10/19 1140  . vasopressin (PITRESSIN) infusion - *FOR SHOCK* Stopped (08/10/19 0254)   . sodium chloride   Intravenous Once  . sodium chloride   Intravenous Once  . artificial tears  1 application Both Eyes Y7C  . chlorhexidine  15 mL Mouth/Throat BID  . Chlorhexidine Gluconate Cloth  6 each Topical Daily  . feeding supplement (PRO-STAT SUGAR FREE 64)  60 mL Per Tube TID  . free water  125 mL Per Tube Q4H  . [START ON 08/11/2019] heparin injection  (subcutaneous)  5,000 Units Subcutaneous Q8H  . hydrocortisone sodium succinate  50 mg Intravenous Q6H  . insulin aspart  0-9 Units Subcutaneous Q4H  . magnesium citrate  0.5 Bottle Per Tube Once  . mouth rinse  15 mL Mouth Rinse 10 times per day  . oxyCODONE  5 mg Oral Q6H  . polyethylene glycol  17 g Per Tube BID  . QUEtiapine  25 mg Oral BID  . sennosides  5 mL Per Tube BID  . sucralfate  1 g Oral Q6H  . tamsulosin  0.4 mg Oral BID   Place/Maintain arterial line **AND** sodium chloride, Place/Maintain arterial line **AND** sodium chloride, acetaminophen (TYLENOL) oral liquid 160 mg/5 mL, acetaminophen, fentaNYL, heparin, influenza vaccine adjuvanted, midazolam, pneumococcal 23 valent vaccine, sodium chloride

## 2019-08-11 ENCOUNTER — Inpatient Hospital Stay (HOSPITAL_COMMUNITY): Payer: Medicare HMO

## 2019-08-11 ENCOUNTER — Encounter (HOSPITAL_COMMUNITY): Payer: Self-pay | Admitting: Gastroenterology

## 2019-08-11 DIAGNOSIS — K5909 Other constipation: Secondary | ICD-10-CM | POA: Diagnosis not present

## 2019-08-11 DIAGNOSIS — I361 Nonrheumatic tricuspid (valve) insufficiency: Secondary | ICD-10-CM | POA: Diagnosis not present

## 2019-08-11 DIAGNOSIS — N179 Acute kidney failure, unspecified: Secondary | ICD-10-CM | POA: Diagnosis not present

## 2019-08-11 DIAGNOSIS — U071 COVID-19: Secondary | ICD-10-CM | POA: Diagnosis not present

## 2019-08-11 DIAGNOSIS — R579 Shock, unspecified: Secondary | ICD-10-CM | POA: Diagnosis not present

## 2019-08-11 DIAGNOSIS — Z9689 Presence of other specified functional implants: Secondary | ICD-10-CM | POA: Diagnosis not present

## 2019-08-11 DIAGNOSIS — E8770 Fluid overload, unspecified: Secondary | ICD-10-CM | POA: Diagnosis not present

## 2019-08-11 DIAGNOSIS — J1289 Other viral pneumonia: Secondary | ICD-10-CM | POA: Diagnosis not present

## 2019-08-11 DIAGNOSIS — J9600 Acute respiratory failure, unspecified whether with hypoxia or hypercapnia: Secondary | ICD-10-CM | POA: Diagnosis not present

## 2019-08-11 DIAGNOSIS — D649 Anemia, unspecified: Secondary | ICD-10-CM | POA: Diagnosis not present

## 2019-08-11 DIAGNOSIS — K250 Acute gastric ulcer with hemorrhage: Secondary | ICD-10-CM

## 2019-08-11 DIAGNOSIS — Z978 Presence of other specified devices: Secondary | ICD-10-CM | POA: Diagnosis not present

## 2019-08-11 DIAGNOSIS — A4901 Methicillin susceptible Staphylococcus aureus infection, unspecified site: Secondary | ICD-10-CM | POA: Diagnosis not present

## 2019-08-11 DIAGNOSIS — I4891 Unspecified atrial fibrillation: Secondary | ICD-10-CM | POA: Diagnosis not present

## 2019-08-11 DIAGNOSIS — E872 Acidosis: Secondary | ICD-10-CM | POA: Diagnosis not present

## 2019-08-11 DIAGNOSIS — I959 Hypotension, unspecified: Secondary | ICD-10-CM | POA: Diagnosis not present

## 2019-08-11 DIAGNOSIS — J9601 Acute respiratory failure with hypoxia: Secondary | ICD-10-CM | POA: Diagnosis not present

## 2019-08-11 LAB — POCT I-STAT, CHEM 8
BUN: 43 mg/dL — ABNORMAL HIGH (ref 8–23)
BUN: 55 mg/dL — ABNORMAL HIGH (ref 8–23)
BUN: 41 mg/dL — ABNORMAL HIGH (ref 8–23)
BUN: 54 mg/dL — ABNORMAL HIGH (ref 8–23)
BUN: 35 mg/dL — ABNORMAL HIGH (ref 8–23)
BUN: 46 mg/dL — ABNORMAL HIGH (ref 8–23)
Calcium, Ion: 0.62 mmol/L — CL (ref 1.15–1.40)
Calcium, Ion: 1.16 mmol/L (ref 1.15–1.40)
Calcium, Ion: 0.57 mmol/L — CL (ref 1.15–1.40)
Calcium, Ion: 1.14 mmol/L — ABNORMAL LOW (ref 1.15–1.40)
Calcium, Ion: 0.41 mmol/L — CL (ref 1.15–1.40)
Calcium, Ion: 1.02 mmol/L — ABNORMAL LOW (ref 1.15–1.40)
Chloride: 92 mmol/L — ABNORMAL LOW (ref 98–111)
Chloride: 94 mmol/L — ABNORMAL LOW (ref 98–111)
Chloride: 90 mmol/L — ABNORMAL LOW (ref 98–111)
Chloride: 91 mmol/L — ABNORMAL LOW (ref 98–111)
Chloride: 90 mmol/L — ABNORMAL LOW (ref 98–111)
Chloride: 92 mmol/L — ABNORMAL LOW (ref 98–111)
Creatinine, Ser: 1.7 mg/dL — ABNORMAL HIGH (ref 0.61–1.24)
Creatinine, Ser: 2.3 mg/dL — ABNORMAL HIGH (ref 0.61–1.24)
Creatinine, Ser: 1.6 mg/dL — ABNORMAL HIGH (ref 0.61–1.24)
Creatinine, Ser: 2.3 mg/dL — ABNORMAL HIGH (ref 0.61–1.24)
Creatinine, Ser: 1.5 mg/dL — ABNORMAL HIGH (ref 0.61–1.24)
Creatinine, Ser: 2 mg/dL — ABNORMAL HIGH (ref 0.61–1.24)
Glucose, Bld: 202 mg/dL — ABNORMAL HIGH (ref 70–99)
Glucose, Bld: 160 mg/dL — ABNORMAL HIGH (ref 70–99)
Glucose, Bld: 170 mg/dL — ABNORMAL HIGH (ref 70–99)
Glucose, Bld: 210 mg/dL — ABNORMAL HIGH (ref 70–99)
Glucose, Bld: 176 mg/dL — ABNORMAL HIGH (ref 70–99)
Glucose, Bld: 214 mg/dL — ABNORMAL HIGH (ref 70–99)
HCT: 26 % — ABNORMAL LOW (ref 39.0–52.0)
HCT: 22 % — ABNORMAL LOW (ref 39.0–52.0)
HCT: 23 % — ABNORMAL LOW (ref 39.0–52.0)
HCT: 26 % — ABNORMAL LOW (ref 39.0–52.0)
HCT: 23 % — ABNORMAL LOW (ref 39.0–52.0)
HCT: 25 % — ABNORMAL LOW (ref 39.0–52.0)
Hemoglobin: 8.8 g/dL — ABNORMAL LOW (ref 13.0–17.0)
Hemoglobin: 7.5 g/dL — ABNORMAL LOW (ref 13.0–17.0)
Hemoglobin: 7.8 g/dL — ABNORMAL LOW (ref 13.0–17.0)
Hemoglobin: 8.8 g/dL — ABNORMAL LOW (ref 13.0–17.0)
Hemoglobin: 7.8 g/dL — ABNORMAL LOW (ref 13.0–17.0)
Hemoglobin: 8.5 g/dL — ABNORMAL LOW (ref 13.0–17.0)
Potassium: 4.1 mmol/L (ref 3.5–5.1)
Potassium: 4.2 mmol/L (ref 3.5–5.1)
Potassium: 4.1 mmol/L (ref 3.5–5.1)
Potassium: 4.3 mmol/L (ref 3.5–5.1)
Potassium: 4.1 mmol/L (ref 3.5–5.1)
Potassium: 4.3 mmol/L (ref 3.5–5.1)
Sodium: 139 mmol/L (ref 135–145)
Sodium: 138 mmol/L (ref 135–145)
Sodium: 137 mmol/L (ref 135–145)
Sodium: 139 mmol/L (ref 135–145)
Sodium: 137 mmol/L (ref 135–145)
Sodium: 140 mmol/L (ref 135–145)
TCO2: 30 mmol/L (ref 22–32)
TCO2: 32 mmol/L (ref 22–32)
TCO2: 31 mmol/L (ref 22–32)
TCO2: 33 mmol/L — ABNORMAL HIGH (ref 22–32)
TCO2: 29 mmol/L (ref 22–32)
TCO2: 33 mmol/L — ABNORMAL HIGH (ref 22–32)

## 2019-08-11 LAB — ECHOCARDIOGRAM COMPLETE
Height: 70 in
Weight: 3302.4 oz

## 2019-08-11 LAB — CBC WITH DIFFERENTIAL/PLATELET
Abs Immature Granulocytes: 0.58 10*3/uL — ABNORMAL HIGH (ref 0.00–0.07)
Basophils Absolute: 0 10*3/uL (ref 0.0–0.1)
Basophils Relative: 0 %
Eosinophils Absolute: 0.1 10*3/uL (ref 0.0–0.5)
Eosinophils Relative: 1 %
HCT: 23.8 % — ABNORMAL LOW (ref 39.0–52.0)
Hemoglobin: 7.6 g/dL — ABNORMAL LOW (ref 13.0–17.0)
Immature Granulocytes: 7 %
Lymphocytes Relative: 4 %
Lymphs Abs: 0.4 10*3/uL — ABNORMAL LOW (ref 0.7–4.0)
MCH: 31.3 pg (ref 26.0–34.0)
MCHC: 31.9 g/dL (ref 30.0–36.0)
MCV: 97.9 fL (ref 80.0–100.0)
Monocytes Absolute: 0.6 10*3/uL (ref 0.1–1.0)
Monocytes Relative: 7 %
Neutro Abs: 6.9 10*3/uL (ref 1.7–7.7)
Neutrophils Relative %: 81 %
Platelets: 114 10*3/uL — ABNORMAL LOW (ref 150–400)
RBC: 2.43 MIL/uL — ABNORMAL LOW (ref 4.22–5.81)
RDW: 15.8 % — ABNORMAL HIGH (ref 11.5–15.5)
WBC: 8.6 10*3/uL (ref 4.0–10.5)
nRBC: 7 % — ABNORMAL HIGH (ref 0.0–0.2)

## 2019-08-11 LAB — COMPREHENSIVE METABOLIC PANEL
ALT: 10 U/L (ref 0–44)
AST: 37 U/L (ref 15–41)
Albumin: 2.2 g/dL — ABNORMAL LOW (ref 3.5–5.0)
Alkaline Phosphatase: 65 U/L (ref 38–126)
Anion gap: 13 (ref 5–15)
BUN: 66 mg/dL — ABNORMAL HIGH (ref 8–23)
CO2: 33 mmol/L — ABNORMAL HIGH (ref 22–32)
Calcium: 9.3 mg/dL (ref 8.9–10.3)
Chloride: 93 mmol/L — ABNORMAL LOW (ref 98–111)
Creatinine, Ser: 2.49 mg/dL — ABNORMAL HIGH (ref 0.61–1.24)
GFR calc Af Amer: 30 mL/min — ABNORMAL LOW (ref >60)
GFR calc non Af Amer: 26 mL/min — ABNORMAL LOW (ref >60)
Glucose, Bld: 130 mg/dL — ABNORMAL HIGH (ref 70–99)
Potassium: 4.5 mmol/L (ref 3.5–5.1)
Sodium: 139 mmol/L (ref 135–145)
Total Bilirubin: 0.4 mg/dL (ref 0.3–1.2)
Total Protein: 5.9 g/dL — ABNORMAL LOW (ref 6.5–8.1)

## 2019-08-11 LAB — RENAL FUNCTION PANEL
Albumin: 2.3 g/dL — ABNORMAL LOW (ref 3.5–5.0)
Albumin: 2.2 g/dL — ABNORMAL LOW (ref 3.5–5.0)
Anion gap: 12 (ref 5–15)
Anion gap: 13 (ref 5–15)
BUN: 66 mg/dL — ABNORMAL HIGH (ref 8–23)
BUN: 53 mg/dL — ABNORMAL HIGH (ref 8–23)
CO2: 34 mmol/L — ABNORMAL HIGH (ref 22–32)
CO2: 35 mmol/L — ABNORMAL HIGH (ref 22–32)
Calcium: 9.3 mg/dL (ref 8.9–10.3)
Calcium: 9 mg/dL (ref 8.9–10.3)
Chloride: 93 mmol/L — ABNORMAL LOW (ref 98–111)
Chloride: 92 mmol/L — ABNORMAL LOW (ref 98–111)
Creatinine, Ser: 2.5 mg/dL — ABNORMAL HIGH (ref 0.61–1.24)
Creatinine, Ser: 2.2 mg/dL — ABNORMAL HIGH (ref 0.61–1.24)
GFR calc Af Amer: 30 mL/min — ABNORMAL LOW (ref >60)
GFR calc Af Amer: 35 mL/min — ABNORMAL LOW (ref >60)
GFR calc non Af Amer: 26 mL/min — ABNORMAL LOW (ref >60)
GFR calc non Af Amer: 30 mL/min — ABNORMAL LOW (ref >60)
Glucose, Bld: 129 mg/dL — ABNORMAL HIGH (ref 70–99)
Glucose, Bld: 190 mg/dL — ABNORMAL HIGH (ref 70–99)
Phosphorus: 4.8 mg/dL — ABNORMAL HIGH (ref 2.5–4.6)
Phosphorus: 3.3 mg/dL (ref 2.5–4.6)
Potassium: 4.5 mmol/L (ref 3.5–5.1)
Potassium: 4.4 mmol/L (ref 3.5–5.1)
Sodium: 139 mmol/L (ref 135–145)
Sodium: 140 mmol/L (ref 135–145)

## 2019-08-11 LAB — POCT I-STAT 7, (LYTES, BLD GAS, ICA,H+H)
Acid-Base Excess: 11 mmol/L — ABNORMAL HIGH (ref 0.0–2.0)
Bicarbonate: 36.5 mmol/L — ABNORMAL HIGH (ref 20.0–28.0)
Calcium, Ion: 1.15 mmol/L (ref 1.15–1.40)
HCT: 24 % — ABNORMAL LOW (ref 39.0–52.0)
Hemoglobin: 8.2 g/dL — ABNORMAL LOW (ref 13.0–17.0)
O2 Saturation: 93 %
Patient temperature: 98
Potassium: 4.4 mmol/L (ref 3.5–5.1)
Sodium: 136 mmol/L (ref 135–145)
TCO2: 38 mmol/L — ABNORMAL HIGH (ref 22–32)
pCO2 arterial: 52.4 mmHg — ABNORMAL HIGH (ref 32.0–48.0)
pH, Arterial: 7.449 (ref 7.350–7.450)
pO2, Arterial: 64 mmHg — ABNORMAL LOW (ref 83.0–108.0)

## 2019-08-11 LAB — PREPARE PLATELET PHERESIS: Unit division: 0

## 2019-08-11 LAB — GLUCOSE, CAPILLARY
Glucose-Capillary: 127 mg/dL — ABNORMAL HIGH (ref 70–99)
Glucose-Capillary: 135 mg/dL — ABNORMAL HIGH (ref 70–99)
Glucose-Capillary: 153 mg/dL — ABNORMAL HIGH (ref 70–99)
Glucose-Capillary: 157 mg/dL — ABNORMAL HIGH (ref 70–99)
Glucose-Capillary: 169 mg/dL — ABNORMAL HIGH (ref 70–99)
Glucose-Capillary: 181 mg/dL — ABNORMAL HIGH (ref 70–99)
Glucose-Capillary: 184 mg/dL — ABNORMAL HIGH (ref 70–99)

## 2019-08-11 LAB — HEMOGLOBIN AND HEMATOCRIT, BLOOD
HCT: 24.7 % — ABNORMAL LOW (ref 39.0–52.0)
HCT: 24.2 % — ABNORMAL LOW (ref 39.0–52.0)
Hemoglobin: 7.8 g/dL — ABNORMAL LOW (ref 13.0–17.0)
Hemoglobin: 7.6 g/dL — ABNORMAL LOW (ref 13.0–17.0)

## 2019-08-11 LAB — MAGNESIUM: Magnesium: 2.3 mg/dL (ref 1.7–2.4)

## 2019-08-11 LAB — BPAM PLATELET PHERESIS
Blood Product Expiration Date: 202010182359
ISSUE DATE / TIME: 202010181216
Unit Type and Rh: 5100

## 2019-08-11 LAB — D-DIMER, QUANTITATIVE: D-Dimer, Quant: >20 ug/mL-FEU — ABNORMAL HIGH (ref 0.00–0.50)

## 2019-08-11 LAB — C-REACTIVE PROTEIN: CRP: 4.2 mg/dL — ABNORMAL HIGH (ref <1.0)

## 2019-08-11 LAB — PHOSPHORUS: Phosphorus: 5 mg/dL — ABNORMAL HIGH (ref 2.5–4.6)

## 2019-08-11 LAB — H. PYLORI ANTIBODY, IGG: H Pylori IgG: 4 Index Value — ABNORMAL HIGH (ref 0.00–0.79)

## 2019-08-11 MED ORDER — CEFAZOLIN SODIUM-DEXTROSE 2-4 GM/100ML-% IV SOLN
2.0000 g | Freq: Two times a day (BID) | INTRAVENOUS | Status: DC
  Administered 2019-08-13 – 2019-08-15 (×6): 2 g via INTRAVENOUS
  Filled 2019-08-11 (×6): qty 100

## 2019-08-11 MED ORDER — DEXTROSE 5 % IV SOLN
Status: DC
  Administered 2019-08-11 – 2019-08-12 (×3): via INTRAVENOUS_CENTRAL
  Filled 2019-08-11 (×5): qty 1500

## 2019-08-11 MED ORDER — SODIUM CHLORIDE 0.9 % IV SOLN
2.0000 g | Freq: Two times a day (BID) | INTRAVENOUS | Status: AC
  Administered 2019-08-12 (×2): 2 g via INTRAVENOUS
  Filled 2019-08-11 (×2): qty 2

## 2019-08-11 MED ORDER — PRISMASOL B22GK 4/0 22-4 MEQ/L IV SOLN
INTRAVENOUS | Status: DC
  Administered 2019-08-11 – 2019-08-12 (×9): via INTRAVENOUS_CENTRAL

## 2019-08-11 NOTE — Progress Notes (Signed)
[facility] ROUNDING NOTE   Subjective:   This is a 68 year old gentleman who was admitted with Covid pneumonia July 13, 2019 completed course of remdesivir convalescent plasma Decadron treated with vancomycin and cefepime from 08/01/2019 until 08/02/2019.  On 08/02/2019 patient developed an ileus.  His creatinine on 07/25/2019 was 1.5 which appears to be his baseline.  On 08/02/2019 started to increase.  On 08/02/2019 was found to have MSSA bacteremia and was started on Ancef.  His blood pressure has been marginal with drops in blood pressure to 70 mmHg.  His renal ultrasound showed no hydronephrosis on 08/07/2019. Pt had administration of intravenous contrast on 07/25/2019.  Off of vaso gtt, now only on 1 pressor levo gtt ~ 8 ug/min today.     No sig UOP. Continues on CRRT with no fluid off. D#5  Filter clotted once last 24 hrs on citrate a/c.    Objective:  Vital signs in last 24 hours:  Temp:  [96.3 F (35.7 C)-98.5 F (36.9 C)] 98.5 F (36.9 C) (10/19 1200) Pulse Rate:  [68-84] 73 (10/19 1245) Resp:  [0-30] 28 (10/19 1245) BP: (93-141)/(57-72) 107/63 (10/19 1200) SpO2:  [95 %-100 %] 99 % (10/19 1245) Arterial Line BP: (83-154)/(28-65) 103/51 (10/19 1245) FiO2 (%):  [50 %-60 %] 50 % (10/19 1140) Weight:  [93.6 kg] 93.6 kg (10/19 0415)  Exam:  pt seen in ICU  on vent, sedated  no jvd  Chest cta bilat  Cor reg no RG  Abd soft ntnd scaphoid   Ext no edema   L IJ temp cath/ R IJ 3-lumen  Assessment/ Plan:   Acute kidney injury with a baseline of chronic kidney disease with baseline serum creatinine about 1.5 mg/dL.  Admitted with Covid pneumonia complicated course with recent history of MSSA bacteremia.  Developed shock hypotension and required pressors.  No evidence of hydronephrosis on renal ultrasound. Suspected ATN.  Could not identify any additional nephrotoxins. There was administration of contrast but this was early on the course of his illness on  07/25/2019. He developed progressive metabolic acidosis and was started on CRRT 08/07/2019. This is D#5.   With citrate local a/c protocol filter clotting issues have improved  Total I/O and wt's are mostly stable/ even, no vol excess on exam, will continue to keep even as tolerated  Metabolic acidosis- resolved  Anemia -as per primary team transfuse as necessary no transfusion requirements at this time  COVID-19 pneumonia-  Treated with remdesivir, dexamethasone and convalescent plasma.  Shock - weaning pressors down, BP's low 100's on levo gtt only  MSSA bacteremia-  vancomycin and anidulafungin have been discontinued.  Continues on cefepime   LOS: 15 [name] @TODAY @8 :11 PM   Basic Metabolic Panel: Recent Labs  Lab 08/07/19 0500  08/08/19 0600  08/09/19 0502  08/09/19 1758 08/09/19 2006  08/10/19 0500  08/10/19 1540  08/11/19 0228 08/11/19 0236 08/11/19 0350 08/11/19 0500 08/11/19 1012 08/11/19 1016  NA 146*   < > 144  141   < > 144   < > 144 143   < > 141  143   < > 139   < > 139 138 136 139  139 137 139  K 5.1   < > 4.8  4.7   < > 5.0   < > 4.5 5.8*   < > 4.9  5.0   < > 4.2   < > 4.1 4.2 4.4 4.5  4.5 4.3 4.1  CL 103   < >  107  105   < > 109   < > 117* 110   < > 103  104   < > 97*   < > 92* 94*  --  93*  93* 91* 90*  CO2 27   < > 25  26   < > 22   < > 18* 22  --  25  25  --  30  --   --   --   --  33*  34*  --   --   GLUCOSE 362*   < > 191*  197*   < > 172*   < > 139* 152*   < > 188*  187*   < > 173*   < > 202* 160*  --  130*  129* 170* 210*  BUN 185*   < > 113*  88*   < > 83*   < > 87* 86*   < > 94*  93*   < > 74*   < > 43* 55*  --  66*  66* 54* 41*  CREATININE 5.44*   < > 3.13*  3.36*   < > 2.67*   < > 2.65* 3.36*   < > 2.81*  2.82*   < > 2.31*   < > 1.70* 2.30*  --  2.49*  2.50* 2.30* 1.60*  CALCIUM 7.1*   < > 7.7*  7.5*   < > 7.7*   < > 6.4* 7.7*  --  8.5*  8.6*  --  8.8*  --   --   --   --  9.3  9.3  --   --   MG 4.0*  --  2.9*  --   2.8*  --   --   --   --  2.5*  --   --   --   --   --   --  2.3  --   --   PHOS 11.2*   < > 3.1  3.1   < > 4.4  --  5.8*  --   --  6.1*  6.2*  --  5.6*  --   --   --   --  5.0*  4.8*  --   --    < > = values in this interval not displayed.    Liver Function Tests: Recent Labs  Lab 08/07/19 0500  08/08/19 0600  08/09/19 0502 08/09/19 1758 08/10/19 0500 08/10/19 1540 08/11/19 0500  AST 28  --  45*  --  39  --  34  --  37  ALT 6  --  5  --  5  --  9  --  10  ALKPHOS 65  --  93  --  91  --  61  --  65  BILITOT 0.3  --  0.5  --  0.5  --  0.3  --  0.4  PROT 5.0*  --  5.7*  --  5.7*  --  5.6*  --  5.9*  ALBUMIN 2.5*   < > 2.6*  2.8*   < > 2.4* 1.9* 2.2*  2.2* 2.3* 2.2*  2.3*   < > = values in this interval not displayed.   No results for input(s): LIPASE, AMYLASE in the last 168 hours. No results for input(s): AMMONIA in the last 168 hours.  CBC: Recent Labs  Lab 08/07/19 0500  08/08/19 0600  08/09/19 0502  08/09/19 1319  08/10/19 0500  08/11/19 0236  08/11/19 0350 08/11/19 0500 08/11/19 1012 08/11/19 1016  WBC 12.1*  --  10.2  --  10.1  --  10.0  --  8.7  --   --   --  8.6  --   --   NEUTROABS 11.3*  --  9.6*  --  9.2*  --   --   --  7.7  --   --   --  6.9  --   --   HGB 8.1*   < > 7.2*   < > 7.9*   < > 8.3*   < > 7.3*   < > 7.5* 8.2* 7.6* 7.8* 8.8*  HCT 27.2*   < > 22.8*   < > 24.6*   < > 25.7*   < > 23.1*   < > 22.0* 24.0* 23.8* 23.0* 26.0*  MCV 103.4*  --  97.4  --  94.3  --  95.9  --  97.1  --   --   --  97.9  --   --   PLT 204  --  187  --  131*  --  128*  --  100*  --   --   --  114*  --   --    < > = values in this interval not displayed.    Cardiac Enzymes: No results for input(s): CKTOTAL, CKMB, CKMBINDEX, TROPONINI in the last 168 hours.  BNP: Invalid input(s): POCBNP  CBG: Recent Labs  Lab 08/10/19 2009 08/10/19 2355 08/11/19 0502 08/11/19 0747 08/11/19 1142  GLUCAP 144* 153* 127* 157* 169*    Microbiology: Results for orders placed or  performed during the hospital encounter of 07/25/19  Blood Culture (routine x 2)     Status: None   Collection Time: 07/25/19  4:56 PM   Specimen: Left Antecubital; Blood  Result Value Ref Range Status   Specimen Description LEFT ANTECUBITAL  Final   Special Requests   Final    BOTTLES DRAWN AEROBIC AND ANAEROBIC Blood Culture results may not be optimal due to an excessive volume of blood received in culture bottles   Culture   Final    NO GROWTH 5 DAYS Performed at [facility], [address] [place], [place] [address]    Report Status 07/30/2019 FINAL  Final  Blood Culture (routine x 2)     Status: None   Collection Time: 07/25/19  5:01 PM   Specimen: BLOOD RIGHT HAND  Result Value Ref Range Status   Specimen Description BLOOD RIGHT HAND  Final   Special Requests   Final    BOTTLES DRAWN AEROBIC AND ANAEROBIC Blood Culture adequate volume   Culture   Final    NO GROWTH 5 DAYS Performed at [facility], [address] [place], [place] [address]    Report Status 07/30/2019 FINAL  Final  MRSA PCR Screening     Status: None   Collection Time: 07/26/19  2:09 AM   Specimen: Nasal Mucosa; Nasopharyngeal  Result Value Ref Range Status   MRSA by PCR NEGATIVE NEGATIVE Final    Comment:        The GeneXpert MRSA Assay (FDA approved for NASAL specimens only), is one component of a comprehensive MRSA colonization surveillance program. It is not intended to diagnose MRSA infection nor to guide or monitor treatment for MRSA infections. Performed at [facility], [place] [address] [place],  [address]   Culture, blood (Routine X 2) w Reflex to ID Panel  Status: Abnormal   Collection Time: 08/01/19 12:15 PM   Specimen: BLOOD  Result Value Ref Range Status   Specimen Description   Final    BLOOD RIGHT ARM Performed at [place] [address] [place], [place] [address]    Special Requests   Final    BOTTLES DRAWN AEROBIC AND  ANAEROBIC Blood Culture adequate volume Performed at [facility] [address] [place], [place] [address]    Culture  Setup Time   Final    GRAM POSITIVE COCCI IN CLUSTERS IN BOTH AEROBIC AND ANAEROBIC BOTTLES CRITICAL RESULT CALLED TO, READ BACK BY AND VERIFIED WITH: C. SHADE PHARMD, AT 8588 08/02/19 BY Rush [place] Performed at [facility], [place] [address], [place], [place] [address]    Culture STAPHYLOCOCCUS AUREUS (A)  Final   Report Status 08/04/2019 FINAL  Final   Organism ID, Bacteria STAPHYLOCOCCUS AUREUS  Final      Susceptibility   Staphylococcus aureus - MIC*    CIPROFLOXACIN <=0.5 SENSITIVE Sensitive     ERYTHROMYCIN <=0.25 SENSITIVE Sensitive     GENTAMICIN <=0.5 SENSITIVE Sensitive     OXACILLIN 0.5 SENSITIVE Sensitive     TETRACYCLINE <=1 SENSITIVE Sensitive     VANCOMYCIN 1 SENSITIVE Sensitive     TRIMETH/SULFA <=10 SENSITIVE Sensitive     CLINDAMYCIN <=0.25 SENSITIVE Sensitive     RIFAMPIN <=0.5 SENSITIVE Sensitive     Inducible Clindamycin NEGATIVE Sensitive     * STAPHYLOCOCCUS AUREUS  Blood Culture ID Panel (Reflexed)     Status: Abnormal   Collection Time: 08/01/19 12:15 PM  Result Value Ref Range Status   Enterococcus species NOT DETECTED NOT DETECTED Final   Listeria monocytogenes NOT DETECTED NOT DETECTED Final   Staphylococcus species DETECTED (A) NOT DETECTED Final    Comment: CRITICAL RESULT CALLED TO, READ BACK BY AND VERIFIED WITH: C. SHADE PHARMD, AT 4128 08/02/19 BY D. VANHOOK    Staphylococcus aureus (BCID) DETECTED (A) NOT DETECTED Final    Comment: Methicillin (oxacillin) susceptible Staphylococcus aureus (MSSA). Preferred therapy is anti staphylococcal beta lactam antibiotic (Cefazolin or Nafcillin), unless clinically contraindicated. CRITICAL RESULT CALLED TO, READ BACK BY AND VERIFIED WITH: C. SHADE PHARMD, AT 7867 08/02/19 BY D. VANHOOK    Methicillin resistance NOT DETECTED NOT DETECTED Final   Streptococcus  species NOT DETECTED NOT DETECTED Final   Streptococcus agalactiae NOT DETECTED NOT DETECTED Final   Streptococcus pneumoniae NOT DETECTED NOT DETECTED Final   Streptococcus pyogenes NOT DETECTED NOT DETECTED Final   Acinetobacter baumannii NOT DETECTED NOT DETECTED Final   Enterobacteriaceae species NOT DETECTED NOT DETECTED Final   Enterobacter cloacae complex NOT DETECTED NOT DETECTED Final   Escherichia coli NOT DETECTED NOT DETECTED Final   Klebsiella oxytoca NOT DETECTED NOT DETECTED Final   Klebsiella pneumoniae NOT DETECTED NOT DETECTED Final   Proteus species NOT DETECTED NOT DETECTED Final   Serratia marcescens NOT DETECTED NOT DETECTED Final   Haemophilus influenzae NOT DETECTED NOT DETECTED Final   Neisseria meningitidis NOT DETECTED NOT DETECTED Final   Pseudomonas aeruginosa NOT DETECTED NOT DETECTED Final   Candida albicans NOT DETECTED NOT DETECTED Final   Candida glabrata NOT DETECTED NOT DETECTED Final   Candida krusei NOT DETECTED NOT DETECTED Final   Candida parapsilosis NOT DETECTED NOT DETECTED Final   Candida tropicalis NOT DETECTED NOT DETECTED Final    Comment: Performed at [facility], [place] [address] [place], [place] [address]  Culture, blood (  Routine X 2) w Reflex to ID Panel     Status: Abnormal   Collection Time: 08/01/19 12:21 PM   Specimen: BLOOD  Result Value Ref Range Status   Specimen Description   Final    BLOOD RIGHT HAND Performed at [place] [place] [address], [place], [place] [address]    Special Requests   Final    BOTTLES DRAWN AEROBIC AND ANAEROBIC Blood Culture adequate volume Performed at [place] [address] [place], [place] [address]    Culture  Setup Time   Final    GRAM POSITIVE COCCI IN CLUSTERS IN BOTH AEROBIC AND ANAEROBIC BOTTLES CRITICAL RESULT CALLED TO, READ BACK BY AND VERIFIED WITH: C. SHADE PHARMD, AT 4174 08/02/19 BY D. VANHOOK    Culture (A)  Final     STAPHYLOCOCCUS AUREUS SUSCEPTIBILITIES PERFORMED ON PREVIOUS CULTURE WITHIN THE LAST 5 DAYS. Performed at [facility], [place] [address], [place], [place] [address]    Report Status 08/04/2019 FINAL  Final  Culture, respiratory     Status: None   Collection Time: 08/01/19  3:30 PM   Specimen: Tracheal Aspirate  Result Value Ref Range Status   Specimen Description   Final    TRACHEAL ASPIRATE Performed at [facility] [address] [place], [place] [address]    Special Requests   Final    NONE Performed at [facility], [place] [address] [place], [place] [address]    Gram Stain   Final    RARE WBC PRESENT, PREDOMINANTLY PMN ABUNDANT GRAM POSITIVE COCCI IN CLUSTERS Performed at [facility], [place] [address], [place], [place] [address]    Culture   Final    MODERATE STAPHYLOCOCCUS AUREUS RARE PSEUDOMONAS AERUGINOSA    Report Status 08/06/2019 FINAL  Final   Organism ID, Bacteria STAPHYLOCOCCUS AUREUS  Final   Organism ID, Bacteria PSEUDOMONAS AERUGINOSA  Final      Susceptibility   Pseudomonas aeruginosa - MIC*    CEFTAZIDIME 4 SENSITIVE Sensitive     CIPROFLOXACIN 0.5 SENSITIVE Sensitive     GENTAMICIN <=1 SENSITIVE Sensitive     IMIPENEM 1 SENSITIVE Sensitive     PIP/TAZO 16 SENSITIVE Sensitive     CEFEPIME 4 SENSITIVE Sensitive     * RARE PSEUDOMONAS AERUGINOSA   Staphylococcus aureus - MIC*    CIPROFLOXACIN <=0.5 SENSITIVE Sensitive     ERYTHROMYCIN <=0.25 SENSITIVE Sensitive     GENTAMICIN <=0.5 SENSITIVE Sensitive     OXACILLIN 0.5 SENSITIVE Sensitive     TETRACYCLINE <=1 SENSITIVE Sensitive     VANCOMYCIN <=0.5 SENSITIVE Sensitive     TRIMETH/SULFA <=10 SENSITIVE Sensitive     CLINDAMYCIN <=0.25 SENSITIVE Sensitive     RIFAMPIN <=0.5 SENSITIVE Sensitive     Inducible Clindamycin NEGATIVE Sensitive     * MODERATE STAPHYLOCOCCUS AUREUS  Culture, Urine     Status: Abnormal   Collection Time: 08/01/19  5:45 PM    Specimen: Urine, Catheterized  Result Value Ref Range Status   Specimen Description   Final    URINE, CATHETERIZED Performed at [place] [address] [place], [place] [address]    Special Requests   Final    NONE Performed at [facility], [place] [address] [place], [place] [address]    Culture (A)  Final    <10,000 COLONIES/mL INSIGNIFICANT GROWTH Performed at [place] [address] [place], [place] [address]  Report Status 08/03/2019 FINAL  Final  MRSA PCR Screening     Status: None   Collection Time: 08/02/19 10:02 AM   Specimen: Nasal Mucosa; Nasopharyngeal  Result Value Ref Range Status   MRSA by PCR NEGATIVE NEGATIVE Final    Comment:        The GeneXpert MRSA Assay (FDA approved for NASAL specimens only), is one component of a comprehensive MRSA colonization surveillance program. It is not intended to diagnose MRSA infection nor to guide or monitor treatment for MRSA infections. Performed at [facility], [place] [address], [place], [place] [address]   Culture, blood (routine x 2)     Status: None   Collection Time: 08/05/19  5:00 AM   Specimen: BLOOD RIGHT HAND  Result Value Ref Range Status   Specimen Description   Final    BLOOD RIGHT HAND Performed at [place] [address], [place], [place] [address]    Special Requests   Final    BOTTLES DRAWN AEROBIC ONLY Blood Culture adequate volume Performed at [place] [address], [place], [place] [address]    Culture   Final    NO GROWTH 5 DAYS Performed at [facility], [place] [address], [place], [place] [address]    Report Status 08/10/2019 FINAL  Final  Culture, blood (routine x 2)     Status: None   Collection Time: 08/05/19  5:05 AM   Specimen: BLOOD LEFT HAND  Result Value Ref Range Status   Specimen Description   Final    BLOOD LEFT HAND Performed at [place] [address], [place], [place] [address]    Special Requests   Final    BOTTLES DRAWN AEROBIC ONLY Blood Culture adequate volume Performed at [place] [address], [place], [place] [address]    Culture   Final    NO GROWTH 5 DAYS Performed at [facility], [place] [address], [place],  [address]    Report Status 08/10/2019 FINAL  Final    Coagulation Studies: No results for input(s): LABPROT, INR in the last 72 hours.  Urinalysis: No results for input(s): COLORURINE, LABSPEC, PHURINE, GLUCOSEU, HGBUR, BILIRUBINUR, KETONESUR, PROTEINUR, UROBILINOGEN, NITRITE, LEUKOCYTESUR in the last 72 hours.  Invalid input(s): APPERANCEUR    Imaging: Dg Abd 1 View  Result Date: 08/09/2019 CLINICAL DATA:  ARDS from COVID-19. EXAM: ABDOMEN - 1 VIEW COMPARISON:  August 06, 2019 FINDINGS: No free air, portal venous gas, or pneumatosis identified on supine imaging. An NG tube terminates in the stomach. Foreign body projects over the midline of the lower pelvis. No other abnormalities. IMPRESSION: 1. The foreign body projecting over the pelvis may be on or in the patient. Recommend clinical correlation. 2. No other acute abnormalities. Electronically Signed   By: Dorise Bullion III M.D   On: 08/09/2019 18:50   Dg Chest Port 1 View  Result Date: 08/10/2019 CLINICAL DATA:  COVID positive EXAM: PORTABLE CHEST 1 VIEW COMPARISON:  08/09/2019 FINDINGS: Left lung apex and lateral costophrenic angle are partially visualized. Endotracheal tube terminates 5 cm above the carina. Right chest tube.  No pneumothorax is seen. Left IJ dual lumen catheter terminates in the mid SVC. Right subclavian venous catheter terminates at the cavoatrial junction. Enteric tube terminates in the proximal stomach. Stable selective balloon occlusion catheter in the right lower lobe bronchus. Multifocal patchy opacities, lower lung predominant, grossly unchanged. No definite pleural  effusions. The heart is normal in size. IMPRESSION: Multifocal patchy opacities, grossly unchanged, compatible with pneumonia in this patient with known COVID. Stable right chest tube.  No pneumothorax is seen. Endotracheal tube terminates 5 cm above the carina. Additional stable support apparatus as above. Electronically Signed   By: Julian Hy M.D.   On: 08/10/2019 16:59     Medications:   .  prismasol BGK 4/2.5 500 mL/hr at 08/11/19 0943  . sodium chloride Stopped (08/01/19 0957)  . sodium chloride 10 mL/hr at 08/09/19 1000  . amiodarone 30 mg/hr (08/11/19 1300)  . calcium gluconate infusion for CRRT 20 g (08/10/19 1839)  . ceFEPime (MAXIPIME) IV 2 g (08/11/19 0924)  . cisatracurium (NIMBEX) infusion 2 mcg/kg/min (08/11/19 1300)  . feeding supplement (PIVOT 1.5 CAL) 1,000 mL (08/10/19 2313)  . fentaNYL infusion INTRAVENOUS 125 mcg/hr (08/11/19 1300)  . midazolam 5 mg/hr (08/11/19 0910)  . norepinephrine (LEVOPHED) Adult infusion 8 mcg/min (08/11/19 0914)  . prismasol BGK 4/2.5 2,000 mL/hr at 08/11/19 1211  . sodium citrate 2 %/dextrose 2.5% solution 3000 mL 410 mL/hr at 08/11/19 1018  . vasopressin (PITRESSIN) infusion - *FOR SHOCK* Stopped (08/10/19 3568)   . sodium chloride   Intravenous Once  . artificial tears  1 application Both Eyes S1U  . chlorhexidine  15 mL Mouth/Throat BID  . Chlorhexidine Gluconate Cloth  6 each Topical Daily  . feeding supplement (PRO-STAT SUGAR FREE 64)  60 mL Per Tube TID  . free water  125 mL Per Tube Q6H  . heparin injection (subcutaneous)  5,000 Units Subcutaneous Q8H  . hydrocortisone sodium succinate  50 mg Intravenous Q8H  . insulin aspart  0-9 Units Subcutaneous Q4H  . magnesium citrate  0.5 Bottle Per Tube Once  . mouth rinse  15 mL Mouth Rinse 10 times per day  . oxyCODONE  5 mg Oral Q6H  . pantoprazole (PROTONIX) IV  40 mg Intravenous Q12H  . polyethylene glycol  17 g Per Tube BID  . QUEtiapine  25 mg Oral BID  . sennosides  5 mL  Per Tube BID  . sucralfate  1 g Oral Q6H  . tamsulosin  0.4 mg Oral BID   Place/Maintain arterial line **AND** sodium chloride, Place/Maintain arterial line **AND** sodium chloride, acetaminophen (TYLENOL) oral liquid 160 mg/5 mL, acetaminophen, fentaNYL, heparin, influenza vaccine adjuvanted, midazolam, pneumococcal 23 valent vaccine, sodium chloride

## 2019-08-11 NOTE — Progress Notes (Signed)
Endobronchial blocker remains secure at 57.5. RT will continue to closely monitor

## 2019-08-11 NOTE — Progress Notes (Signed)
PROGRESS NOTE    [name]  GDJ:[id number] DOB: 1951-04-12 DOA: 07/25/2019 PCP: Kathyrn Drown, MD   Brief Narrative:  68 year old BM PMHx GERD, BPH   Presented to [facility] ER with shortness of breath and hypoxia.  Patient was diagnosed with COVID 19 on July 13 9019.  Patient's wife also has COVID-19 but is asymptomatic.  Over the last 1 to 2 days, the patient's shortness of breath is increased.  Patient has been suffering with anorexia.  Patient's wife was actually having a conversation via teleconference with the patient's PCP.  PCP is also otherwise physician.  He had actually called to check on how the wife was doing.  The wife had mentioned to the physician that the patient has been laying in bed.  When the physician listened to how the patient was speaking over the phone, it was noted the patient was tachypneic.  EMS was activated.  When EMS arrived, the patient's O2 saturations were apparently in the 40 percentile range.  Patient was placed on nonrebreather.  He was transferred to the ER.  In the ER, his oxygen saturations on 100% nonrebreather were only in the 80s.  Patient was confused.  Patient was emergently intubated.  Patient given IV steroids.  Initial chest x-ray demonstrates diffuse multifocal infiltrates.  CT scan of the chest was negative for PE.  ABG prior to leaving for [place] demonstrates a pH 7.41 PCO2 44 PO2 of 87 on FiO2 100%.  AA gradient calculates out to 571.  I personally called the patient's wife on the evening of July 25, 2019.  During that conversation we had discussed using IV steroids, IV remdesivir.  We also discussed off label use of Actemra and possibly confluence of plasma due to the patient's severe COVID-19 pneumonia.  She verbally agreed that the medical team should try anything possible to make the patient better.  Confirm with the wife the patient is a full code.    ED Course: emergently intubated. Central line placed. Started on  IV Decadron.    Subjective: 10/19 sedated/intubated.  Right pneumothorax and air leak currently sealed.    Assessment & Plan:   Principal Problem:   Pneumonia due to COVID-19 virus Active Problems:   BPH (benign prostatic hyperplasia)   Acute respiratory failure due to COVID-19 (HCC)   AKI (acute kidney injury) (HCC)   Fluid overload   Pneumothorax on right   Acute renal failure (ARF) (HCC)   GI bleed   Atrial fibrillation with RVR (HCC)   Constipation   Gastric ulcer with hemorrhage  COVID pneumonia/acute respiratory stress with hypoxia/bacterial superinfection - Continue Decadron - Continue Remdesivir per pharmacy protocol - 10/2 Actemra x1 dose - 10/3 counseled patient wife on CCP over phone and she has agreed to allow [place] to administer CCP.  Paperwork in chart  -10/3 transfuse 1 unit CCP -Unable to prone secondary to RIGHT pneumothorax with fistula and air leak.   -10/3 patient enrolled in [facility]  Lab 08/06/19 0610 08/07/19 0500 08/08/19 0600 08/09/19 0502 08/10/19 0500  CRP 4.6* 16.0* 10.4* 5.4* 4.8*   Recent Labs  Lab 08/06/19 0610 08/07/19 0500 08/08/19 0600 08/09/19 0502 08/10/19 0500  DDIMER 2.98* 4.74* 3.84* 4.64* 12.24*  - Titrate patient's vent settings to maintain SPO2 88 to 93%, if possible.  Will use ARDS lung protective protocol (6 to 8 cc/kg/min). - Attempt to maintain plateau pressure<30 - Attempt to maintain driving pressure 15 - 10/6 transfuse 1  unit CCP (this is patient's second unit) -10/10 MSSA bacteremia.  Antibiotics initiated per ID recommendations -10/13 PCXR large RIGHT sided pneumothorax; s/p chest tube placement with reinflation of lung.  High-volume leak (questionable minimal leak at insertion site improved with Vaseline gauze) -10/14 continue broad-spectrum antibiotic and antifungal added -10/14 stress dose steroids added -10/15 s/p bronchoscopy with placement of endobronchial balloon right intermedius  bronchi.  Patient now with minimal air leak -10/16 s/p repeat bronchoscopy to adjust endobronchial balloon.  Airleak now again resealed.  Still minimal air leak. -10/17 although air leak appears to have worsened by observation of Pleur-evac, clinically patient stable.  This is most likely secondary to patient trying to initiate large breaths.  Will paralyze patient with Nimbex.  NOTE after paralyzation with Nimbex decreased air leak, this will allow patient's lung to hopefully heal without further intervention -10/19 airleak currently sealed.  Continue current treatment unchanged.  A. fib RVR -Had talked about cardioverting patient but given right pneumothorax with leak and endobronchial balloon in place would not chance procedure. -10/17 restart amiodarone drip -10/19 restart heparin; monitor closely for GI bleed.  Septic shock/Hypotension -Continued Norepinephrine and Vasopressin drip to maintain MAP>65  GI bleed  Recent Labs  Lab 08/11/19 0100 08/11/19 0228 08/11/19 0236 08/11/19 0350 08/11/19 0500  HGB 7.8* 8.8* 7.5* 8.2* 7.6*  -Hemoglobin trending down over last 24 hours. -Occult blood pending -10/14 Heparin SQ (hold).   -10/16 GI bleed continuing off Heparin for 48 hrs. . -10/16 Transfuse 1 unit PRBC -Increase Protonix BID---> Protonix gtt -10/16 start Octreotide gtt  -BMP q 8 hr per GI -Discussed case with Dr. Gerilyn Nestle GI.  See her note from 10/16 -10/16 Dr. Roselie Awkward spoke with Dr. Joylene Igo GI and they agreed that if patient continues to bleed will bring equipment to [facility] to perform any required procedure. -10/17 s/p EGD per Dr. Joannie Springs GI large gastric ulcer with vessel successfully clipped see report below -10/18 H&H dropped again today.  Patient with low platelets. -10/18 transfuse 1 unit platelets  Gastric ulcer - See GI bleed -10/17 octreotide discontinued -Sucralfate 1 g  QID x1 week -Change Protonix infusion to 40 mg IV BID -Will  need follow-up EGD in 2 to 3 months  Pneumoperitoneum? -Per Dr. Lake Bells radiology contacted him believes has pneumoperitoneum -Negative pneumoperitoneum on lateral decubitus x-ray.  Constipation (patient with large stool burden) -Per GI significant residual and stomach - MiraLAX - Senokot  Fluid overload - Strict in and out +4.8 L -Daily weight Filed Weights   08/09/19 0441 08/10/19 0500 08/11/19 0415  Weight: 95.3 kg 96 kg 93.6 kg  - CVP daily -10/19 CVP = 10  Acute renal failure - Avoid nephrotoxic medication Recent Labs  Lab 08/10/19 1832 08/10/19 1838 08/11/19 0228 08/11/19 0236 08/11/19 0500  CREATININE 2.30* 1.70* 1.70* 2.30* 2.49*   2.50*  -10/14 renal ultrasound pending -SPEP, UPEP, complements, c-ANCA, urine electrolytes, urine osmolality, serum osmolality, renal ultrasound pending - Spoke with Dr. Justin Mend nephrology will see patient first thing in the a.m. -10/15 Per nephrology begin CRRT  Metabolic acidosis -CRRT  BPH -Flomax 0.4 mg BID    Goals of care - CCP use; counseled Mrs. Anchondo that CCP is not improving therapy for COVID-19 though available data shows it may have benefit with high titer plasma is given.  Is unclear if it is effective other patients and with use of low titer plasma.  Risk and benefits of its use were explained to Mrs.  Musich and she agreed to treatment..  All appropriate IRB consent paperwork has been signed and is in patient's chart.   DVT prophylaxis: Heparin per pharmacy Code Status: Full Family Communication: 10/18 Dr. Marchelle Gearing spoke with family.   Disposition Plan: TBD   Consultants:  PCCM Nephrology   Procedures/Significant Events:  10/2 PCXR;-Endotracheal tube remains with tip above the thoracic inlet. Recommend repositioning and advancement. Esophagogastric tube with tip and side port below the diaphragm. - Unchanged diffuse, extensive bilateral heterogeneous opacity, consistent with multifocal infection,  edema, and/or ARDS. 10/3 PCXR; slightly improved extensive bilateral heterogeneous opacifications (my read) 10/3 transfuse 1 unit CCP 10/5 transfuse 1 unit FFP 10/6 transfuse 1 unit CCP (this is patient's second unit) 10/10 PCXR - Personally reviewed: Poor inspiration - mild improvement in aeration 10/11 PCXR - Personally reviewed: Left lower lobe appears to be improving; RLL stable 10/13 PCXR - Personally reviewed: Large R sided pneumothorax - re-expanded after chest tube placed overnight 10/14 PCXR;-small residual right apical pneumothorax, significantly decreased. -Stable extensive patchy opacities throughout both lungs 10/14 DG abdomen decubitus; negative evidence of pneumoperitoneum -Moderate gas in nondependent:; Nonobstructive bowel gas pattern 10/15 s/p bronchoscopy with placement of endobronchial balloon right intermedius bronchi 10/15 renal ultrasound; normal ultrasound 10/15 Per nephrology begin CRRT 10/15 s/p bronchoscopy place endobronchial balloon 10/16 s/p repeat bronchoscopy to adjust endobronchial balloon 10/16 transfuse 1 unit PRBC 10/17 s/p EGD-no active bleeding or blood in the stomach -Very large (700 mL) gastric residual, mostly tube feeding, without any anatomic gastric outlet obstruction, suggestive of gastric ileus  - Gastric ulcer at the angularis of lesser curve of stomach, approximately 1.5 to 2 cm in greatest dimension, cratered, benign-appearing, nonbleeding, with visible vessel but no adherent clot--successfully clipped 10/17 PCXR;-Redemonstrated extensive bilateral slightly nodular airspace opacities. Left basilar/retrocardiac consolidative opacities are unchanged. No new focal airspace opacities.  10/17 KUB;No free air, portal venous gas, or pneumatosis identified on supine<BR>imaging. An NG tube terminates in the stomach. Foreign body projects<BR>over the midline of the lower pelvis. No other abnormalities. 10/18 transfuse 1 unit platelets    I have  personally reviewed and interpreted all radiology studies and my findings are as above. VENTILATOR SETTINGS: Vent mode; PRVC Vt Set; 480 Set rate; 28 FiO2; 50% I time;  PEEP; 10     Cultures 9/29 Novel coronavirus positive 10/2 blood LEFT antecubital NGTD 10/2 blood hand NGTD 10/3 MRSA by PCR negative     Antimicrobials: Anti-infectives (From admission, onward)   Start     Stop   08/13/19 0200  ceFAZolin (ANCEF) IVPB 2g/100 mL premix     09/15/19 2359   08/12/19 0200  ceFEPIme (MAXIPIME) 2 g in sodium chloride 0.9 % 100 mL IVPB     08/12/19 2359   08/07/19 2200  ceFEPIme (MAXIPIME) 2 g in sodium chloride 0.9 % 100 mL IVPB  Status:  Discontinued     08/11/19 1509   08/07/19 1000  anidulafungin (ERAXIS) 100 mg in sodium chloride 0.9 % 100 mL IVPB  Status:  Discontinued     08/08/19 1203   08/07/19 0800  ceFEPIme (MAXIPIME) 2 g in sodium chloride 0.9 % 100 mL IVPB  Status:  Discontinued     08/07/19 1540   08/06/19 1000  anidulafungin (ERAXIS) 200 mg in sodium chloride 0.9 % 200 mL IVPB    Note to Pharmacy: please   08/06/19 1600   08/06/19 0800  ceFEPIme (MAXIPIME) 2 g in sodium chloride 0.9 % 100 mL IVPB  Status:  Discontinued  08/06/19 1036   08/02/19 2200  ceFAZolin (ANCEF) IVPB 2g/100 mL premix  Status:  Discontinued     08/06/19 0733   08/02/19 1400  vancomycin (VANCOCIN) 1,250 mg in sodium chloride 0.9 % 250 mL IVPB  Status:  Discontinued     08/02/19 1332   08/01/19 1330  vancomycin (VANCOCIN) 2,000 mg in sodium chloride 0.9 % 500 mL IVPB     08/01/19 1636   08/01/19 1300  ceFEPIme (MAXIPIME) 2 g in sodium chloride 0.9 % 100 mL IVPB  Status:  Discontinued     08/02/19 1332   07/26/19 1600  remdesivir 100 mg in sodium chloride 0.9 % 250 mL IVPB     07/29/19 1823   07/26/19 0215  remdesivir 200 mg in sodium chloride 0.9 % 250 mL IVPB     07/26/19 0520       Devices    LINES / TUBES:  #7.5 ETT 10/2>> RIGHT IJ CVL>>> RIGHT #28 French chest tube  10/13>>> LEFT Ig HD cath 10/14>> RIGHT interlobar bronchial balloon 10/15/>>   Continuous Infusions:   prismasol BGK 4/2.5 500 mL/hr at 08/10/19 1923   sodium chloride Stopped (08/01/19 0957)   sodium chloride 10 mL/hr at 08/09/19 1000   amiodarone 30 mg/hr (08/11/19 0800)   calcium gluconate infusion for CRRT 20 g (08/10/19 1839)   ceFEPime (MAXIPIME) IV 2 g (08/10/19 2228)   cisatracurium (NIMBEX) infusion 2 mcg/kg/min (08/11/19 0800)   epinephrine     feeding supplement (PIVOT 1.5 CAL) 1,000 mL (08/10/19 2313)   fentaNYL infusion INTRAVENOUS 125 mcg/hr (08/11/19 0800)   midazolam 5 mg/hr (08/10/19 2335)   norepinephrine (LEVOPHED) Adult infusion 6 mcg/min (08/11/19 0730)   prismasol BGK 4/2.5 2,000 mL/hr at 08/11/19 0717   sodium citrate 2 %/dextrose 2.5% solution 3000 mL 390 mL/hr at 08/11/19 0551   vasopressin (PITRESSIN) infusion - *FOR SHOCK* Stopped (08/10/19 0906)     Objective: Vitals:   08/11/19 0645 08/11/19 0700 08/11/19 0715 08/11/19 0730  BP:  101/62    Pulse: 72 73 73 72  Resp: (!) 28 (!) 28 (!) 30 (!) 30  Temp: 98.2 F (36.8 C)   (!) 97.3 F (36.3 C)  TempSrc: Axillary   Axillary  SpO2: 100% 100% 100% 98%  Weight:      Height:        Intake/Output Summary (Last 24 hours) at 08/11/2019 0823 Last data filed at 08/11/2019 0800 Gross per 24 hour  Intake 4544.93 ml  Output 4869 ml  Net -324.07 ml   Filed Weights   08/09/19 0441 08/10/19 0500 08/11/19 0415  Weight: 95.3 kg 96 kg 93.6 kg   Physical Exam:  General: Sedated/intubated positive acute respiratory distress Eyes: negative scleral hemorrhage, negative anisocoria, negative icterus ENT: Negative Runny nose, negative gingival bleeding, #7.5 ETT in place, positive endobronchial balloon in position negative air leak Neck:  Negative scars, masses, torticollis, lymphadenopathy, JVD Lungs: Tachypneic, clear to auscultation bilaterally without wheezes or crackles, RIGHT side chest tube  in place negative air leak.  Cardiovascular: Regular rate and rhythm without murmur gallop or rub normal S1 and S2 Abdomen: negative abdominal pain, nondistended, positive soft, bowel sounds, no rebound, no ascites, no appreciable mass Extremities: No significant cyanosis, clubbing, or edema bilateral lower extremities Skin: Negative rashes, lesions, ulcers Psychiatric: Sedated/intubated Central nervous system: Sedated/intubated            Data Reviewed: Care during the described time interval was provided by me .  I have reviewed this  patient's available data, including medical history, events of note, physical examination, and all test results as part of my evaluation.   CBC: Recent Labs  Lab 08/07/19 0500  08/08/19 0600  08/09/19 0502  08/09/19 1319  08/10/19 0500  08/11/19 0100 08/11/19 0228 08/11/19 0236 08/11/19 0350 08/11/19 0500  WBC 12.1*  --  10.2  --  10.1  --  10.0  --  8.7  --   --   --   --   --  8.6  NEUTROABS 11.3*  --  9.6*  --  9.2*  --   --   --  7.7  --   --   --   --   --  6.9  HGB 8.1*   < > 7.2*   < > 7.9*   < > 8.3*   < > 7.3*   < > 7.8* 8.8* 7.5* 8.2* 7.6*  HCT 27.2*   < > 22.8*   < > 24.6*   < > 25.7*   < > 23.1*   < > 24.7* 26.0* 22.0* 24.0* 23.8*  MCV 103.4*  --  97.4  --  94.3  --  95.9  --  97.1  --   --   --   --   --  97.9  PLT 204  --  187  --  131*  --  128*  --  100*  --   --   --   --   --  114*   < > = values in this interval not displayed.   Basic Metabolic Panel: Recent Labs  Lab 08/07/19 0500  08/08/19 0600  08/09/19 0502  08/09/19 1758 08/09/19 2006  08/10/19 0500  08/10/19 1540  08/10/19 1832 08/10/19 1838 08/11/19 0228 08/11/19 0236 08/11/19 0350 08/11/19 0500  NA 146*   < > 144   141   < > 144   < > 144 143   < > 141   143   < > 139   < > 138 139 139 138 136 139   139  K 5.1   < > 4.8   4.7   < > 5.0   < > 4.5 5.8*   < > 4.9   5.0   < > 4.2   < > 4.2 4.1 4.1 4.2 4.4 4.5   4.5  CL 103   < > 107   105   < > 109   < >  117* 110   < > 103   104   < > 97*   < > 94* 91* 92* 94*  --  93*   93*  CO2 27   < > 25   26   < > 22   < > 18* 22  --  25   25  --  30  --   --   --   --   --   --  33*   34*  GLUCOSE 362*   < > 191*   197*   < > 172*   < > 139* 152*   < > 188*   187*   < > 173*   < > 179* 221* 202* 160*  --  130*   129*  BUN 185*   < > 113*   88*   < > 83*   < > 87* 86*   < > 94*   93*   < > 74*   < >  59* 48* 43* 55*  --  66*   66*  CREATININE 5.44*   < > 3.13*   3.36*   < > 2.67*   < > 2.65* 3.36*   < > 2.81*   2.82*   < > 2.31*   < > 2.30* 1.70* 1.70* 2.30*  --  2.49*   2.50*  CALCIUM 7.1*   < > 7.7*   7.5*   < > 7.7*   < > 6.4* 7.7*  --  8.5*   8.6*  --  8.8*  --   --   --   --   --   --  9.3   9.3  MG 4.0*  --  2.9*  --  2.8*  --   --   --   --  2.5*  --   --   --   --   --   --   --   --  2.3  PHOS 11.2*   < > 3.1   3.1   < > 4.4  --  5.8*  --   --  6.1*   6.2*  --  5.6*  --   --   --   --   --   --  5.0*   4.8*   < > = values in this interval not displayed.   GFR: Estimated Creatinine Clearance: 33.4 mL/min (A) (by C-G formula based on SCr of 2.5 mg/dL (H)). Liver Function Tests: Recent Labs  Lab 08/07/19 0500  08/08/19 0600  08/09/19 0502 08/09/19 1758 08/10/19 0500 08/10/19 1540 08/11/19 0500  AST 28  --  45*  --  39  --  34  --  37  ALT 6  --  5  --  5  --  9  --  10  ALKPHOS 65  --  93  --  91  --  61  --  65  BILITOT 0.3  --  0.5  --  0.5  --  0.3  --  0.4  PROT 5.0*  --  5.7*  --  5.7*  --  5.6*  --  5.9*  ALBUMIN 2.5*   < > 2.6*   2.8*   < > 2.4* 1.9* 2.2*   2.2* 2.3* 2.2*   2.3*   < > = values in this interval not displayed.   No results for input(s): LIPASE, AMYLASE in the last 168 hours. No results for input(s): AMMONIA in the last 168 hours. Coagulation Profile: No results for input(s): INR, PROTIME in the last 168 hours. Cardiac Enzymes: No results for input(s): CKTOTAL, CKMB, CKMBINDEX, TROPONINI in the last 168 hours. BNP (last 3 results) No results for input(s): PROBNP in  the last 8760 hours. HbA1C: No results for input(s): HGBA1C in the last 72 hours. CBG: Recent Labs  Lab 08/10/19 0645 08/10/19 2009 08/10/19 2355 08/11/19 0502 08/11/19 0747  GLUCAP 177* 144* 153* 127* 157*   Lipid Profile: No results for input(s): CHOL, HDL, LDLCALC, TRIG, CHOLHDL, LDLDIRECT in the last 72 hours. Thyroid Function Tests: No results for input(s): TSH, T4TOTAL, FREET4, T3FREE, THYROIDAB in the last 72 hours. Anemia Panel: No results for input(s): VITAMINB12, FOLATE, FERRITIN, TIBC, IRON, RETICCTPCT in the last 72 hours. Urine analysis:    Component Value Date/Time   COLORURINE YELLOW 08/01/2019 1745   APPEARANCEUR HAZY (A) 08/01/2019 1745   LABSPEC 1.023 08/01/2019 1745   PHURINE 5.0 08/01/2019 1745   GLUCOSEU NEGATIVE 08/01/2019 1745   HGBUR  NEGATIVE 08/01/2019 [place] 08/01/2019 1745   KETONESUR NEGATIVE 08/01/2019 1745   PROTEINUR NEGATIVE 08/01/2019 1745   NITRITE NEGATIVE 08/01/2019 1745   LEUKOCYTESUR NEGATIVE 08/01/2019 1745   Sepsis Labs: @LABRCNTIP (procalcitonin:4,lacticidven:4)  ) Recent Results (from the past 240 hour(s))  Culture, blood (Routine X 2) w Reflex to ID Panel     Status: Abnormal   Collection Time: 08/01/19 12:15 PM   Specimen: BLOOD  Result Value Ref Range Status   Specimen Description   Final    BLOOD RIGHT ARM Performed at [facility], [place] [address], [place], [place] [address]    Special Requests   Final    BOTTLES DRAWN AEROBIC AND ANAEROBIC Blood Culture adequate volume Performed at [facility] [address] [place], [place] [address]    Culture  Setup Time   Final    GRAM POSITIVE COCCI IN CLUSTERS IN BOTH AEROBIC AND ANAEROBIC BOTTLES CRITICAL RESULT CALLED TO, READ BACK BY AND VERIFIED WITH: C. SHADE PHARMD, AT 8676 08/02/19 BY Rush Landmark Performed at [facility], [place] [address], [place], [place] [address]    Culture STAPHYLOCOCCUS AUREUS (A)   Final   Report Status 08/04/2019 FINAL  Final   Organism ID, Bacteria STAPHYLOCOCCUS AUREUS  Final      Susceptibility   Staphylococcus aureus - MIC*    CIPROFLOXACIN <=0.5 SENSITIVE Sensitive     ERYTHROMYCIN <=0.25 SENSITIVE Sensitive     GENTAMICIN <=0.5 SENSITIVE Sensitive     OXACILLIN 0.5 SENSITIVE Sensitive     TETRACYCLINE <=1 SENSITIVE Sensitive     VANCOMYCIN 1 SENSITIVE Sensitive     TRIMETH/SULFA <=10 SENSITIVE Sensitive     CLINDAMYCIN <=0.25 SENSITIVE Sensitive     RIFAMPIN <=0.5 SENSITIVE Sensitive     Inducible Clindamycin NEGATIVE Sensitive     * STAPHYLOCOCCUS AUREUS  Blood Culture ID Panel (Reflexed)     Status: Abnormal   Collection Time: 08/01/19 12:15 PM  Result Value Ref Range Status   Enterococcus species NOT DETECTED NOT DETECTED Final   Listeria monocytogenes NOT DETECTED NOT DETECTED Final   Staphylococcus species DETECTED (A) NOT DETECTED Final    Comment: CRITICAL RESULT CALLED TO, READ BACK BY AND VERIFIED WITH: C. SHADE PHARMD, AT 7096 08/02/19 BY D. VANHOOK    Staphylococcus aureus (BCID) DETECTED (A) NOT DETECTED Final    Comment: Methicillin (oxacillin) susceptible Staphylococcus aureus (MSSA). Preferred therapy is anti staphylococcal beta lactam antibiotic (Cefazolin or Nafcillin), unless clinically contraindicated. CRITICAL RESULT CALLED TO, READ BACK BY AND VERIFIED WITH: C. SHADE PHARMD, AT 2836 08/02/19 BY D. VANHOOK    Methicillin resistance NOT DETECTED NOT DETECTED Final   Streptococcus species NOT DETECTED NOT DETECTED Final   Streptococcus agalactiae NOT DETECTED NOT DETECTED Final   Streptococcus pneumoniae NOT DETECTED NOT DETECTED Final   Streptococcus pyogenes NOT DETECTED NOT DETECTED Final   Acinetobacter baumannii NOT DETECTED NOT DETECTED Final   Enterobacteriaceae species NOT DETECTED NOT DETECTED Final   Enterobacter cloacae complex NOT DETECTED NOT DETECTED Final   Escherichia coli NOT DETECTED NOT DETECTED Final    Klebsiella oxytoca NOT DETECTED NOT DETECTED Final   Klebsiella pneumoniae NOT DETECTED NOT DETECTED Final   Proteus species NOT DETECTED NOT DETECTED Final   Serratia marcescens NOT DETECTED NOT DETECTED Final   Haemophilus influenzae NOT DETECTED NOT DETECTED Final   Neisseria meningitidis NOT DETECTED NOT DETECTED Final   Pseudomonas aeruginosa NOT DETECTED NOT DETECTED Final   Candida  albicans NOT DETECTED NOT DETECTED Final   Candida glabrata NOT DETECTED NOT DETECTED Final   Candida krusei NOT DETECTED NOT DETECTED Final   Candida parapsilosis NOT DETECTED NOT DETECTED Final   Candida tropicalis NOT DETECTED NOT DETECTED Final    Comment: Performed at [facility], [place] [address], [place], [place] [address]  Culture, blood (Routine X 2) w Reflex to ID Panel     Status: Abnormal   Collection Time: 08/01/19 12:21 PM   Specimen: BLOOD  Result Value Ref Range Status   Specimen Description   Final    BLOOD RIGHT HAND Performed at [facility] [address], [place], [place] [address]    Special Requests   Final    BOTTLES DRAWN AEROBIC AND ANAEROBIC Blood Culture adequate volume Performed at [facility] [address], [place], [place] [address]    Culture  Setup Time   Final    GRAM POSITIVE COCCI IN CLUSTERS IN BOTH AEROBIC AND ANAEROBIC BOTTLES CRITICAL RESULT CALLED TO, READ BACK BY AND VERIFIED WITH: C. SHADE PHARMD, AT 3299 08/02/19 BY D. VANHOOK    Culture (A)  Final    STAPHYLOCOCCUS AUREUS SUSCEPTIBILITIES PERFORMED ON PREVIOUS CULTURE WITHIN THE LAST 5 DAYS. Performed at [facility], [place] [address], [place], [place] [address]    Report Status 08/04/2019 FINAL  Final  Culture, respiratory     Status: None   Collection Time: 08/01/19  3:30 PM   Specimen: Tracheal Aspirate  Result Value Ref Range Status   Specimen Description   Final    TRACHEAL ASPIRATE Performed at [facility]  [address] [place] [address], [place], [place] [address]    Special Requests   Final    NONE Performed at [facility], [place] [address] [place], [place] [address]    Gram Stain   Final    RARE WBC PRESENT, PREDOMINANTLY PMN ABUNDANT GRAM POSITIVE COCCI IN CLUSTERS Performed at [facility], [place] [address] [place], [place] [address]    Culture   Final    MODERATE STAPHYLOCOCCUS AUREUS RARE PSEUDOMONAS AERUGINOSA    Report Status 08/06/2019 FINAL  Final   Organism ID, Bacteria STAPHYLOCOCCUS AUREUS  Final   Organism ID, Bacteria PSEUDOMONAS AERUGINOSA  Final      Susceptibility   Pseudomonas aeruginosa - MIC*    CEFTAZIDIME 4 SENSITIVE Sensitive     CIPROFLOXACIN 0.5 SENSITIVE Sensitive     GENTAMICIN <=1 SENSITIVE Sensitive     IMIPENEM 1 SENSITIVE Sensitive     PIP/TAZO 16 SENSITIVE Sensitive     CEFEPIME 4 SENSITIVE Sensitive     * RARE PSEUDOMONAS AERUGINOSA   Staphylococcus aureus - MIC*    CIPROFLOXACIN <=0.5 SENSITIVE Sensitive     ERYTHROMYCIN <=0.25 SENSITIVE Sensitive     GENTAMICIN <=0.5 SENSITIVE Sensitive     OXACILLIN 0.5 SENSITIVE Sensitive     TETRACYCLINE <=1 SENSITIVE Sensitive     VANCOMYCIN <=0.5 SENSITIVE Sensitive     TRIMETH/SULFA <=10 SENSITIVE Sensitive     CLINDAMYCIN <=0.25 SENSITIVE Sensitive     RIFAMPIN <=0.5 SENSITIVE Sensitive     Inducible Clindamycin NEGATIVE Sensitive     * MODERATE STAPHYLOCOCCUS AUREUS  Culture, Urine     Status: Abnormal   Collection Time: 08/01/19  5:45 PM   Specimen: Urine, Catheterized  Result Value Ref Range Status   Specimen Description   Final    URINE, CATHETERIZED Performed at [facility]  [facility], [place] [address], [place], [place] [address]    Special Requests   Final    NONE Performed at [facility], [place] [address] [place], [place] [address]    Culture (A)  Final    <10,000 COLONIES/mL INSIGNIFICANT GROWTH Performed at [place] [address]  [place], [place] [address]    Report Status 08/03/2019 FINAL  Final  MRSA PCR Screening     Status: None   Collection Time: 08/02/19 10:02 AM   Specimen: Nasal Mucosa; Nasopharyngeal  Result Value Ref Range Status   MRSA by PCR NEGATIVE NEGATIVE Final    Comment:        The GeneXpert MRSA Assay (FDA approved for NASAL specimens only), is one component of a comprehensive MRSA colonization surveillance program. It is not intended to diagnose MRSA infection nor to guide or monitor treatment for MRSA infections. Performed at [facility], [place] [address] [place], [place] [address]   Culture, blood (routine x 2)     Status: None   Collection Time: 08/05/19  5:00 AM   Specimen: BLOOD RIGHT HAND  Result Value Ref Range Status   Specimen Description   Final    BLOOD RIGHT HAND Performed at [place] [address], [place], [place] [address]    Special Requests   Final    BOTTLES DRAWN AEROBIC ONLY Blood Culture adequate volume Performed at [place] [address], [place], [place] [address]    Culture   Final    NO GROWTH 5 DAYS Performed at [facility], [place] [address], [place], [place] [address]    Report Status 08/10/2019 FINAL  Final  Culture, blood (routine x 2)     Status: None   Collection Time: 08/05/19  5:05 AM   Specimen: BLOOD LEFT HAND  Result Value Ref Range Status   Specimen Description   Final    BLOOD LEFT HAND Performed at [place] [address], [place], [place] [address]    Special Requests   Final    BOTTLES DRAWN AEROBIC ONLY Blood Culture adequate volume Performed at [place] [address] [place], [place] [address]    Culture   Final    NO GROWTH 5 DAYS Performed at [facility], [place] [address], [place], [place] [address]    Report Status 08/10/2019 FINAL  Final         Radiology Studies: Dg Abd 1 View  Result Date:  08/09/2019 CLINICAL DATA:  ARDS from COVID-19. EXAM: ABDOMEN - 1 VIEW COMPARISON:  August 06, 2019 FINDINGS: No free air, portal venous gas, or pneumatosis identified on supine imaging. An NG tube terminates in the stomach. Foreign body projects over the midline of the lower pelvis. No other abnormalities. IMPRESSION: 1. The foreign body projecting over the pelvis may be on or in the patient. Recommend clinical correlation. 2. No other acute abnormalities. Electronically Signed   By: Dorise Bullion III M.D   On: 08/09/2019 18:50   Dg Chest Port 1 View  Result Date: 08/10/2019 CLINICAL DATA:  COVID positive EXAM: PORTABLE CHEST 1 VIEW COMPARISON:  08/09/2019 FINDINGS: Left lung apex and lateral costophrenic angle are partially visualized. Endotracheal tube terminates 5 cm above the carina. Right chest tube.  No pneumothorax is seen. Left IJ dual lumen catheter terminates in the mid SVC. Right subclavian venous catheter terminates at the cavoatrial junction. Enteric  tube terminates in the proximal stomach. Stable selective balloon occlusion catheter in the right lower lobe bronchus. Multifocal patchy opacities, lower lung predominant, grossly unchanged. No definite pleural effusions. The heart is normal in size. IMPRESSION: Multifocal patchy opacities, grossly unchanged, compatible with pneumonia in this patient with known COVID. Stable right chest tube.  No pneumothorax is seen. Endotracheal tube terminates 5 cm above the carina. Additional stable support apparatus as above. Electronically Signed   By: Julian Hy M.D.   On: 08/10/2019 16:59        Scheduled Meds:  sodium chloride   Intravenous Once   artificial tears  1 application Both Eyes A1O   chlorhexidine  15 mL Mouth/Throat BID   Chlorhexidine Gluconate Cloth  6 each Topical Daily   feeding supplement (PRO-STAT SUGAR FREE 64)  60 mL Per Tube TID   free water  125 mL Per Tube Q6H   heparin injection (subcutaneous)  5,000  Units Subcutaneous Q8H   hydrocortisone sodium succinate  50 mg Intravenous Q8H   insulin aspart  0-9 Units Subcutaneous Q4H   magnesium citrate  0.5 Bottle Per Tube Once   mouth rinse  15 mL Mouth Rinse 10 times per day   oxyCODONE  5 mg Oral Q6H   pantoprazole (PROTONIX) IV  40 mg Intravenous Q12H   polyethylene glycol  17 g Per Tube BID   QUEtiapine  25 mg Oral BID   sennosides  5 mL Per Tube BID   sucralfate  1 g Oral Q6H   tamsulosin  0.4 mg Oral BID   Continuous Infusions:   prismasol BGK 4/2.5 500 mL/hr at 08/10/19 1923   sodium chloride Stopped (08/01/19 0957)   sodium chloride 10 mL/hr at 08/09/19 1000   amiodarone 30 mg/hr (08/11/19 0800)   calcium gluconate infusion for CRRT 20 g (08/10/19 1839)   ceFEPime (MAXIPIME) IV 2 g (08/10/19 2228)   cisatracurium (NIMBEX) infusion 2 mcg/kg/min (08/11/19 0800)   epinephrine     feeding supplement (PIVOT 1.5 CAL) 1,000 mL (08/10/19 2313)   fentaNYL infusion INTRAVENOUS 125 mcg/hr (08/11/19 0800)   midazolam 5 mg/hr (08/10/19 2335)   norepinephrine (LEVOPHED) Adult infusion 6 mcg/min (08/11/19 0730)   prismasol BGK 4/2.5 2,000 mL/hr at 08/11/19 0717   sodium citrate 2 %/dextrose 2.5% solution 3000 mL 390 mL/hr at 08/11/19 0551   vasopressin (PITRESSIN) infusion - *FOR SHOCK* Stopped (08/10/19 0906)     LOS: 17 days   The patient is critically ill with multiple organ systems failure and requires high complexity decision making for assessment and support, frequent evaluation and titration of therapies, application of advanced monitoring technologies and extensive interpretation of multiple databases. Critical Care Time devoted to patient care services described in this note  Time spent: 40 minutes     Warda Mcqueary, Geraldo Docker, MD [facility] Pager [phone]  If 7PM-7AM, please contact night-coverage [email or web address] Password [email or web address] 08/11/2019, 8:23 AM

## 2019-08-11 NOTE — Progress Notes (Signed)
Spoke with patient's wife, [name], and provided full update/answered all questions.

## 2019-08-11 NOTE — Progress Notes (Signed)
Spoke with pt's wife over the phone.  Updated about current status and treatment plan.  Chesley Mires, MD [facility] Pulmonary/Critical Care 08/11/2019, 2:49 PM

## 2019-08-11 NOTE — Progress Notes (Signed)
   08/11/19 1400  Clinical Encounter Type  Visited With Family (Wife)  Visit Type Initial;Psychological support;Spiritual support  Spiritual Encounters  Spiritual Needs Emotional;Other (Comment) (Spiritual Care Conversation/Support)  Stress Factors  Family Stress Factors None identified   I called Mrs. Barrington to provide spiritual care. She did not answer the phone. I will follow up at a later time.    Chaplain Shanon Ace M.Div., [facility]

## 2019-08-11 NOTE — Progress Notes (Signed)
  Echocardiogram 2D Echocardiogram has been performed.  [name] 08/11/2019, 4:01 PM

## 2019-08-11 NOTE — Progress Notes (Signed)
200cc in residuals found. Returned 160cc of residual. Will continue to monitor.

## 2019-08-11 NOTE — Progress Notes (Addendum)
NAME:  [name], MRN:  [id number], DOB:  03-12-1951, LOS: 69 ADMISSION DATE:  07/25/2019, CONSULTATION DATE:  07/26/2019 REFERRING MD:  Bridgett Larsson, [facility] COMPLAINT:  Dyspnea   Brief History   68 yo male dx with COVID 07/22/19 presented to [facility] ER 07/25/19 with progressive dyspnea and hypoxia requiring intubation from COVID 19 pneumonia.  Past Medical History  GERD  Significant Hospital Events   10/03 Admit to [facility] from [facility] ER, start decadron and remdesivir, given tociluzimab; prone positioning 10/04 convalescent plasma 10/05 stop prone positioning 10/06 convalescent plasma; prone positioning again 10/09 start ABx 10/10 MSSA bacteremia, CVL d/ced; ID consulted; increase OG tube outpt 10/11 vent weaning trial started 10/13 fever, pneumothorax, pig tail chest tube placed 10/14 worsening hypotension and renal fx >> consulted nephrology; worsening PTX >> replaced chest tube; GNR in sputum >> ABx changed 10/15 start CRRT; endobronchial blocker placed for persistent air leak 10/16 endobronchial blocker repositioned, changed chest tube to water seal; melana with ABLA >> GI consulted; transfuse PRBC 10/17 persistent air leak, increased WOB; start nimbex gtt >> air leak decreased; EGD; A fib with RVR >> start amiodarone 10/18 transfuse PRBC; GI s/o  Consults:  ID Nephrology Gastroenterology  Procedures:  10/2 ETT>  10/2 R IJ CVL > 10/10 10/13 Rt pig tail chest tube >> 10/14 10/14 R 56F chest tube >  10/14 R subclavian CVL >  10/14 L IJ HD cath >   Significant Diagnostic Tests:  10/2 CT angiogram chest > extensive bilateral airspace disease predominantly posterior 10/8 doppler legs b/l > no DVT 10/15 renal u/s > normal 10/17 EGD > non bleeding gastric ulcer   Micro Data:  9/20 SARS COV 2 > POSITIVE 10/2 blood > negative 10/9 blood > MSSA 2/4 10/9 resp > MSSA, Pseudomonas 10/13 blood >> negative  Antimicrobials/COVID Rx  10/3 remdesivir > 10/6 10/3 actemra 10/3 decadron >  10/12 10/4 convalescent plasma 10/6 convalescent plasma  10/9 vancomycin > 10/10 10/9 cefepime > 10/10 10/10 ancef > 10/13 10/13 cefepime >  10/14 anidulofungin > 10/15  Interim history/subjective:  Remains on CRRT, nimbex, deep sedation, pressors.  No air leak with CT to water seal.  Endobronchial blocker remains at 57.5 cm.    Objective   Blood pressure 107/63, pulse 77, temperature 98.5 F (36.9 C), temperature source Axillary, resp. rate (!) 28, height 5\' 10"  (1.778 m), weight 93.6 kg, SpO2 99 %. CVP:  [10 mmHg-11 mmHg] 10 mmHg  Vent Mode: PRVC FiO2 (%):  [50 %-60 %] 50 % Set Rate:  [28 bmp] 28 bmp Vt Set:  [480 mL] 480 mL PEEP:  [10 cmH20-12 cmH20] 10 cmH20 Plateau Pressure:  [30 cmH20-32 cmH20] 30 cmH20   Intake/Output Summary (Last 24 hours) at 08/11/2019 1247 Last data filed at 08/11/2019 1200 Gross per 24 hour  Intake 4675.75 ml  Output 4715 ml  Net -39.25 ml   HYPOTHERMIC, using Bair hugger  Filed Weights   08/09/19 0441 08/10/19 0500 08/11/19 0415  Weight: 95.3 kg 96 kg 93.6 kg    Examination:  General - sedated, chemically paralyzed Eyes - pupils reactive ENT - ETT in place Cardiac - irregular Chest - b/l rhonchi, Rt chest tube in place Abdomen - soft, non tender, + bowel sounds Extremities - 1+ edema Skin - no rashes Neuro - RASS -5  CXR (reviewed by me) - b/l ASD, endobrochial blocker in good position in RLL, no PTX   Resolved Hospital Problem list     Assessment & Plan:  Acute hypoxic respiratory failure from COVID 19 pneumonia with ARDS complicated by MSSA/Pseudomonal HCAP and Rt tension PTX with persistent air leak. - goal SpO2 88 to 95% - f/u CXR - try to wean PEEP to 8 and then try to deflate endobronchial blocker - keep chest tube to water seal - would continue nimbex for now until sure air leak resolved  MSSA HCAP with bacteremia, Pseudomonal HCAP. - per ID: complete 7 days of cefepime for Pseudomonas, then transition back to  ancef to complete 6 weeks of therapy for MSSA bacteremia  Multifactorial shock. - from sepsis, tension PTX, relative adrenal insufficiency, ABLA, medications and possibly cardiac - pressors to keep MAP > 65 - check Echo to assess LV function  New onset A fib with RVR 10/17. - continue amiodarone  AKI from ATN. - continue CRRT per renal  Acute blood loss anemia from Upper GI bleed 2nd to Gastric ulcer s/p EGD with clipping. - continue BID protonix - f/u CBC, transfuse for Hb < 7 or significant bleeding  Acute metabolic encephalopathy. - RASS goal -5 while on nimbex - continue schedule oxycodone, seroquel  Hyperglycemia. - SSI  Best practice:  Diet: tube feeding  DVT prophylaxis: SQ heparin GI prophylaxis: protonix Mobility: bed rest Code Status: DNR Disposition: ICU  Labs    CMP Latest Ref Rng & Units 08/11/2019 08/11/2019 08/11/2019  Glucose 70 - 99 mg/dL 210(H) 170(H) 130(H)  BUN 8 - 23 mg/dL 41(H) 54(H) 66(H)  Creatinine 0.61 - 1.24 mg/dL 1.60(H) 2.30(H) 2.49(H)  Sodium 135 - 145 mmol/L 139 137 139  Potassium 3.5 - 5.1 mmol/L 4.1 4.3 4.5  Chloride 98 - 111 mmol/L 90(L) 91(L) 93(L)  CO2 22 - 32 mmol/L - - 33(H)  Calcium 8.9 - 10.3 mg/dL - - 9.3  Total Protein 6.5 - 8.1 g/dL - - 5.9(L)  Total Bilirubin 0.3 - 1.2 mg/dL - - 0.4  Alkaline Phos 38 - 126 U/L - - 65  AST 15 - 41 U/L - - 37  ALT 0 - 44 U/L - - 10   CBC Latest Ref Rng & Units 08/11/2019 08/11/2019 08/11/2019  WBC 4.0 - 10.5 K/uL - - 8.6  Hemoglobin 13.0 - 17.0 g/dL 8.8(L) 7.8(L) 7.6(L)  Hematocrit 39.0 - 52.0 % 26.0(L) 23.0(L) 23.8(L)  Platelets 150 - 400 K/uL - - 114(L)   ABG    Component Value Date/Time   PHART 7.449 08/11/2019 0350   PCO2ART 52.4 (H) 08/11/2019 0350   PO2ART 64.0 (L) 08/11/2019 0350   HCO3 36.5 (H) 08/11/2019 0350   TCO2 31 08/11/2019 1016   ACIDBASEDEF 3.0 (H) 08/09/2019 1430   O2SAT 93.0 08/11/2019 0350   CBG (last 3)  Recent Labs    08/11/19 0502 08/11/19 0747  08/11/19 1142  GLUCAP 127* 157* 169*   CC time 42 minutes  Chesley Mires, MD [place] 08/11/2019, 1:39 PM

## 2019-08-12 ENCOUNTER — Inpatient Hospital Stay (HOSPITAL_COMMUNITY): Payer: Medicare HMO

## 2019-08-12 DIAGNOSIS — I959 Hypotension, unspecified: Secondary | ICD-10-CM | POA: Diagnosis not present

## 2019-08-12 DIAGNOSIS — A4901 Methicillin susceptible Staphylococcus aureus infection, unspecified site: Secondary | ICD-10-CM | POA: Diagnosis not present

## 2019-08-12 DIAGNOSIS — U071 COVID-19: Secondary | ICD-10-CM | POA: Diagnosis not present

## 2019-08-12 DIAGNOSIS — K250 Acute gastric ulcer with hemorrhage: Secondary | ICD-10-CM | POA: Diagnosis not present

## 2019-08-12 DIAGNOSIS — E8770 Fluid overload, unspecified: Secondary | ICD-10-CM | POA: Diagnosis not present

## 2019-08-12 DIAGNOSIS — Z9689 Presence of other specified functional implants: Secondary | ICD-10-CM | POA: Diagnosis not present

## 2019-08-12 DIAGNOSIS — D649 Anemia, unspecified: Secondary | ICD-10-CM | POA: Diagnosis not present

## 2019-08-12 DIAGNOSIS — I4891 Unspecified atrial fibrillation: Secondary | ICD-10-CM | POA: Diagnosis not present

## 2019-08-12 DIAGNOSIS — E872 Acidosis: Secondary | ICD-10-CM | POA: Diagnosis not present

## 2019-08-12 DIAGNOSIS — J9601 Acute respiratory failure with hypoxia: Secondary | ICD-10-CM | POA: Diagnosis not present

## 2019-08-12 DIAGNOSIS — J9600 Acute respiratory failure, unspecified whether with hypoxia or hypercapnia: Secondary | ICD-10-CM | POA: Diagnosis not present

## 2019-08-12 DIAGNOSIS — N179 Acute kidney failure, unspecified: Secondary | ICD-10-CM | POA: Diagnosis not present

## 2019-08-12 DIAGNOSIS — J9690 Respiratory failure, unspecified, unspecified whether with hypoxia or hypercapnia: Secondary | ICD-10-CM | POA: Diagnosis not present

## 2019-08-12 DIAGNOSIS — K254 Chronic or unspecified gastric ulcer with hemorrhage: Secondary | ICD-10-CM | POA: Diagnosis not present

## 2019-08-12 DIAGNOSIS — R579 Shock, unspecified: Secondary | ICD-10-CM | POA: Diagnosis not present

## 2019-08-12 DIAGNOSIS — Z978 Presence of other specified devices: Secondary | ICD-10-CM | POA: Diagnosis not present

## 2019-08-12 DIAGNOSIS — J1289 Other viral pneumonia: Secondary | ICD-10-CM | POA: Diagnosis not present

## 2019-08-12 LAB — POCT I-STAT EG7
Acid-Base Excess: 5 mmol/L — ABNORMAL HIGH (ref 0.0–2.0)
Acid-Base Excess: 6 mmol/L — ABNORMAL HIGH (ref 0.0–2.0)
Bicarbonate: 32 mmol/L — ABNORMAL HIGH (ref 20.0–28.0)
Bicarbonate: 31.5 mmol/L — ABNORMAL HIGH (ref 20.0–28.0)
Calcium, Ion: 0.33 mmol/L — CL (ref 1.15–1.40)
Calcium, Ion: 0.57 mmol/L — CL (ref 1.15–1.40)
HCT: 28 % — ABNORMAL LOW (ref 39.0–52.0)
HCT: 25 % — ABNORMAL LOW (ref 39.0–52.0)
Hemoglobin: 9.5 g/dL — ABNORMAL LOW (ref 13.0–17.0)
Hemoglobin: 8.5 g/dL — ABNORMAL LOW (ref 13.0–17.0)
O2 Saturation: 71 %
O2 Saturation: 58 %
Patient temperature: 98.6
Patient temperature: 98.2
Potassium: 4.1 mmol/L (ref 3.5–5.1)
Potassium: 4.5 mmol/L (ref 3.5–5.1)
Sodium: 140 mmol/L (ref 135–145)
Sodium: 140 mmol/L (ref 135–145)
TCO2: 34 mmol/L — ABNORMAL HIGH (ref 22–32)
TCO2: 33 mmol/L — ABNORMAL HIGH (ref 22–32)
pCO2, Ven: 63.7 mmHg — ABNORMAL HIGH (ref 44.0–60.0)
pCO2, Ven: 51.2 mmHg (ref 44.0–60.0)
pH, Ven: 7.309 (ref 7.250–7.430)
pH, Ven: 7.396 (ref 7.250–7.430)
pO2, Ven: 42 mmHg (ref 32.0–45.0)
pO2, Ven: 31 mmHg — CL (ref 32.0–45.0)

## 2019-08-12 LAB — POCT I-STAT 7, (LYTES, BLD GAS, ICA,H+H)
Acid-Base Excess: 6 mmol/L — ABNORMAL HIGH (ref 0.0–2.0)
Acid-Base Excess: 4 mmol/L — ABNORMAL HIGH (ref 0.0–2.0)
Acid-Base Excess: 10 mmol/L — ABNORMAL HIGH (ref 0.0–2.0)
Acid-Base Excess: 9 mmol/L — ABNORMAL HIGH (ref 0.0–2.0)
Acid-base deficit: 1 mmol/L (ref 0.0–2.0)
Bicarbonate: 30.6 mmol/L — ABNORMAL HIGH (ref 20.0–28.0)
Bicarbonate: 24.3 mmol/L (ref 20.0–28.0)
Bicarbonate: 31.2 mmol/L — ABNORMAL HIGH (ref 20.0–28.0)
Bicarbonate: 33.9 mmol/L — ABNORMAL HIGH (ref 20.0–28.0)
Bicarbonate: 34.2 mmol/L — ABNORMAL HIGH (ref 20.0–28.0)
Calcium, Ion: 0.88 mmol/L — CL (ref 1.15–1.40)
Calcium, Ion: 0.78 mmol/L — CL (ref 1.15–1.40)
Calcium, Ion: 0.33 mmol/L — CL (ref 1.15–1.40)
Calcium, Ion: 0.8 mmol/L — CL (ref 1.15–1.40)
Calcium, Ion: 0.93 mmol/L — ABNORMAL LOW (ref 1.15–1.40)
HCT: 25 % — ABNORMAL LOW (ref 39.0–52.0)
HCT: 21 % — ABNORMAL LOW (ref 39.0–52.0)
HCT: 28 % — ABNORMAL LOW (ref 39.0–52.0)
HCT: 23 % — ABNORMAL LOW (ref 39.0–52.0)
HCT: 24 % — ABNORMAL LOW (ref 39.0–52.0)
Hemoglobin: 8.5 g/dL — ABNORMAL LOW (ref 13.0–17.0)
Hemoglobin: 7.1 g/dL — ABNORMAL LOW (ref 13.0–17.0)
Hemoglobin: 9.5 g/dL — ABNORMAL LOW (ref 13.0–17.0)
Hemoglobin: 7.8 g/dL — ABNORMAL LOW (ref 13.0–17.0)
Hemoglobin: 8.2 g/dL — ABNORMAL LOW (ref 13.0–17.0)
O2 Saturation: 94 %
O2 Saturation: 92 %
O2 Saturation: 60 %
O2 Saturation: 96 %
O2 Saturation: 95 %
Patient temperature: 34.4
Patient temperature: 98.6
Patient temperature: 97.7
Patient temperature: 97.7
Patient temperature: 35.7
Potassium: 3.6 mmol/L (ref 3.5–5.1)
Potassium: 3 mmol/L — ABNORMAL LOW (ref 3.5–5.1)
Potassium: 4.3 mmol/L (ref 3.5–5.1)
Potassium: 4.5 mmol/L (ref 3.5–5.1)
Potassium: 4.5 mmol/L (ref 3.5–5.1)
Sodium: 140 mmol/L (ref 135–145)
Sodium: 145 mmol/L (ref 135–145)
Sodium: 140 mmol/L (ref 135–145)
Sodium: 138 mmol/L (ref 135–145)
Sodium: 138 mmol/L (ref 135–145)
TCO2: 32 mmol/L (ref 22–32)
TCO2: 26 mmol/L (ref 22–32)
TCO2: 33 mmol/L — ABNORMAL HIGH (ref 22–32)
TCO2: 35 mmol/L — ABNORMAL HIGH (ref 22–32)
TCO2: 36 mmol/L — ABNORMAL HIGH (ref 22–32)
pCO2 arterial: 36.9 mmHg (ref 32.0–48.0)
pCO2 arterial: 45.5 mmHg (ref 32.0–48.0)
pCO2 arterial: 56.5 mmHg — ABNORMAL HIGH (ref 32.0–48.0)
pCO2 arterial: 44.3 mmHg (ref 32.0–48.0)
pCO2 arterial: 45.6 mmHg (ref 32.0–48.0)
pH, Arterial: 7.517 — ABNORMAL HIGH (ref 7.350–7.450)
pH, Arterial: 7.336 — ABNORMAL LOW (ref 7.350–7.450)
pH, Arterial: 7.347 — ABNORMAL LOW (ref 7.350–7.450)
pH, Arterial: 7.49 — ABNORMAL HIGH (ref 7.350–7.450)
pH, Arterial: 7.478 — ABNORMAL HIGH (ref 7.350–7.450)
pO2, Arterial: 55 mmHg — ABNORMAL LOW (ref 83.0–108.0)
pO2, Arterial: 69 mmHg — ABNORMAL LOW (ref 83.0–108.0)
pO2, Arterial: 33 mmHg — CL (ref 83.0–108.0)
pO2, Arterial: 73 mmHg — ABNORMAL LOW (ref 83.0–108.0)
pO2, Arterial: 65 mmHg — ABNORMAL LOW (ref 83.0–108.0)

## 2019-08-12 LAB — RENAL FUNCTION PANEL
Albumin: 2.1 g/dL — ABNORMAL LOW (ref 3.5–5.0)
Albumin: 2.6 g/dL — ABNORMAL LOW (ref 3.5–5.0)
Anion gap: 14 (ref 5–15)
Anion gap: 13 (ref 5–15)
BUN: 46 mg/dL — ABNORMAL HIGH (ref 8–23)
BUN: 39 mg/dL — ABNORMAL HIGH (ref 8–23)
CO2: 32 mmol/L (ref 22–32)
CO2: 32 mmol/L (ref 22–32)
Calcium: 7.3 mg/dL — ABNORMAL LOW (ref 8.9–10.3)
Calcium: 7.5 mg/dL — ABNORMAL LOW (ref 8.9–10.3)
Chloride: 94 mmol/L — ABNORMAL LOW (ref 98–111)
Chloride: 94 mmol/L — ABNORMAL LOW (ref 98–111)
Creatinine, Ser: 1.91 mg/dL — ABNORMAL HIGH (ref 0.61–1.24)
Creatinine, Ser: 1.81 mg/dL — ABNORMAL HIGH (ref 0.61–1.24)
GFR calc Af Amer: 41 mL/min — ABNORMAL LOW (ref >60)
GFR calc Af Amer: 44 mL/min — ABNORMAL LOW (ref >60)
GFR calc non Af Amer: 36 mL/min — ABNORMAL LOW (ref >60)
GFR calc non Af Amer: 38 mL/min — ABNORMAL LOW (ref >60)
Glucose, Bld: 216 mg/dL — ABNORMAL HIGH (ref 70–99)
Glucose, Bld: 188 mg/dL — ABNORMAL HIGH (ref 70–99)
Phosphorus: 2.6 mg/dL (ref 2.5–4.6)
Phosphorus: 3.6 mg/dL (ref 2.5–4.6)
Potassium: 4 mmol/L (ref 3.5–5.1)
Potassium: 4.6 mmol/L (ref 3.5–5.1)
Sodium: 140 mmol/L (ref 135–145)
Sodium: 139 mmol/L (ref 135–145)

## 2019-08-12 LAB — POCT I-STAT, CHEM 8
BUN: 32 mg/dL — ABNORMAL HIGH (ref 8–23)
BUN: 40 mg/dL — ABNORMAL HIGH (ref 8–23)
BUN: 37 mg/dL — ABNORMAL HIGH (ref 8–23)
BUN: 31 mg/dL — ABNORMAL HIGH (ref 8–23)
Calcium, Ion: 0.4 mmol/L — CL (ref 1.15–1.40)
Calcium, Ion: 0.9 mmol/L — ABNORMAL LOW (ref 1.15–1.40)
Calcium, Ion: 0.96 mmol/L — ABNORMAL LOW (ref 1.15–1.40)
Calcium, Ion: 0.38 mmol/L — CL (ref 1.15–1.40)
Chloride: 92 mmol/L — ABNORMAL LOW (ref 98–111)
Chloride: 93 mmol/L — ABNORMAL LOW (ref 98–111)
Chloride: 96 mmol/L — ABNORMAL LOW (ref 98–111)
Chloride: 93 mmol/L — ABNORMAL LOW (ref 98–111)
Creatinine, Ser: 1.5 mg/dL — ABNORMAL HIGH (ref 0.61–1.24)
Creatinine, Ser: 1.9 mg/dL — ABNORMAL HIGH (ref 0.61–1.24)
Creatinine, Ser: 1.8 mg/dL — ABNORMAL HIGH (ref 0.61–1.24)
Creatinine, Ser: 1.3 mg/dL — ABNORMAL HIGH (ref 0.61–1.24)
Glucose, Bld: 186 mg/dL — ABNORMAL HIGH (ref 70–99)
Glucose, Bld: 220 mg/dL — ABNORMAL HIGH (ref 70–99)
Glucose, Bld: 194 mg/dL — ABNORMAL HIGH (ref 70–99)
Glucose, Bld: 228 mg/dL — ABNORMAL HIGH (ref 70–99)
HCT: 23 % — ABNORMAL LOW (ref 39.0–52.0)
HCT: 28 % — ABNORMAL LOW (ref 39.0–52.0)
HCT: 25 % — ABNORMAL LOW (ref 39.0–52.0)
HCT: 28 % — ABNORMAL LOW (ref 39.0–52.0)
Hemoglobin: 7.8 g/dL — ABNORMAL LOW (ref 13.0–17.0)
Hemoglobin: 9.5 g/dL — ABNORMAL LOW (ref 13.0–17.0)
Hemoglobin: 8.5 g/dL — ABNORMAL LOW (ref 13.0–17.0)
Hemoglobin: 9.5 g/dL — ABNORMAL LOW (ref 13.0–17.0)
Potassium: 4 mmol/L (ref 3.5–5.1)
Potassium: 4.1 mmol/L (ref 3.5–5.1)
Potassium: 3.9 mmol/L (ref 3.5–5.1)
Potassium: 4 mmol/L (ref 3.5–5.1)
Sodium: 139 mmol/L (ref 135–145)
Sodium: 139 mmol/L (ref 135–145)
Sodium: 138 mmol/L (ref 135–145)
Sodium: 141 mmol/L (ref 135–145)
TCO2: 30 mmol/L (ref 22–32)
TCO2: 34 mmol/L — ABNORMAL HIGH (ref 22–32)
TCO2: 30 mmol/L (ref 22–32)
TCO2: 27 mmol/L (ref 22–32)

## 2019-08-12 LAB — CBC WITH DIFFERENTIAL/PLATELET
Abs Immature Granulocytes: 0.63 10*3/uL — ABNORMAL HIGH (ref 0.00–0.07)
Basophils Absolute: 0 10*3/uL (ref 0.0–0.1)
Basophils Relative: 1 %
Eosinophils Absolute: 0.1 10*3/uL (ref 0.0–0.5)
Eosinophils Relative: 1 %
HCT: 24.2 % — ABNORMAL LOW (ref 39.0–52.0)
Hemoglobin: 7.5 g/dL — ABNORMAL LOW (ref 13.0–17.0)
Immature Granulocytes: 8 %
Lymphocytes Relative: 5 %
Lymphs Abs: 0.4 10*3/uL — ABNORMAL LOW (ref 0.7–4.0)
MCH: 30.7 pg (ref 26.0–34.0)
MCHC: 31 g/dL (ref 30.0–36.0)
MCV: 99.2 fL (ref 80.0–100.0)
Monocytes Absolute: 0.4 10*3/uL (ref 0.1–1.0)
Monocytes Relative: 5 %
Neutro Abs: 6.7 10*3/uL (ref 1.7–7.7)
Neutrophils Relative %: 80 %
Platelets: 119 10*3/uL — ABNORMAL LOW (ref 150–400)
RBC: 2.44 MIL/uL — ABNORMAL LOW (ref 4.22–5.81)
RDW: 16.5 % — ABNORMAL HIGH (ref 11.5–15.5)
WBC: 8.3 10*3/uL (ref 4.0–10.5)
nRBC: 2.2 % — ABNORMAL HIGH (ref 0.0–0.2)

## 2019-08-12 LAB — COMPREHENSIVE METABOLIC PANEL
ALT: 9 U/L (ref 0–44)
AST: 32 U/L (ref 15–41)
Albumin: 2.1 g/dL — ABNORMAL LOW (ref 3.5–5.0)
Alkaline Phosphatase: 63 U/L (ref 38–126)
Anion gap: 12 (ref 5–15)
BUN: 45 mg/dL — ABNORMAL HIGH (ref 8–23)
CO2: 33 mmol/L — ABNORMAL HIGH (ref 22–32)
Calcium: 7.3 mg/dL — ABNORMAL LOW (ref 8.9–10.3)
Chloride: 95 mmol/L — ABNORMAL LOW (ref 98–111)
Creatinine, Ser: 1.89 mg/dL — ABNORMAL HIGH (ref 0.61–1.24)
GFR calc Af Amer: 42 mL/min — ABNORMAL LOW (ref >60)
GFR calc non Af Amer: 36 mL/min — ABNORMAL LOW (ref >60)
Glucose, Bld: 215 mg/dL — ABNORMAL HIGH (ref 70–99)
Potassium: 4 mmol/L (ref 3.5–5.1)
Sodium: 140 mmol/L (ref 135–145)
Total Bilirubin: 0.5 mg/dL (ref 0.3–1.2)
Total Protein: 5.9 g/dL — ABNORMAL LOW (ref 6.5–8.1)

## 2019-08-12 LAB — GLUCOSE, CAPILLARY
Glucose-Capillary: 153 mg/dL — ABNORMAL HIGH (ref 70–99)
Glucose-Capillary: 171 mg/dL — ABNORMAL HIGH (ref 70–99)
Glucose-Capillary: 188 mg/dL — ABNORMAL HIGH (ref 70–99)
Glucose-Capillary: 199 mg/dL — ABNORMAL HIGH (ref 70–99)
Glucose-Capillary: 222 mg/dL — ABNORMAL HIGH (ref 70–99)

## 2019-08-12 LAB — HEMOGLOBIN AND HEMATOCRIT, BLOOD
HCT: 23.3 % — ABNORMAL LOW (ref 39.0–52.0)
Hemoglobin: 7.2 g/dL — ABNORMAL LOW (ref 13.0–17.0)

## 2019-08-12 LAB — C-REACTIVE PROTEIN: CRP: 5.5 mg/dL — ABNORMAL HIGH (ref <1.0)

## 2019-08-12 LAB — D-DIMER, QUANTITATIVE: D-Dimer, Quant: 11.03 ug/mL-FEU — ABNORMAL HIGH (ref 0.00–0.50)

## 2019-08-12 LAB — HEPARIN LEVEL (UNFRACTIONATED): Heparin Unfractionated: 0.41 IU/mL (ref 0.30–0.70)

## 2019-08-12 LAB — MAGNESIUM: Magnesium: 2.1 mg/dL (ref 1.7–2.4)

## 2019-08-12 MED ORDER — HEPARIN (PORCINE) 25000 UT/250ML-% IV SOLN
1300.0000 [IU]/h | INTRAVENOUS | Status: DC
  Administered 2019-08-12 – 2019-08-13 (×2): 1300 [IU]/h via INTRAVENOUS
  Filled 2019-08-12 (×2): qty 250

## 2019-08-12 MED ORDER — PRISMASOL BGK 4/2.5 32-4-2.5 MEQ/L IV SOLN
INTRAVENOUS | Status: DC
  Administered 2019-08-12 – 2019-08-13 (×5): via INTRAVENOUS_CENTRAL

## 2019-08-12 MED ORDER — PRISMASOL BGK 4/2.5 32-4-2.5 MEQ/L REPLACEMENT SOLN
Status: DC
  Administered 2019-08-12 – 2019-08-13 (×3): via INTRAVENOUS_CENTRAL

## 2019-08-12 MED ORDER — ALBUMIN HUMAN 25 % IV SOLN
50.0000 g | Freq: Once | INTRAVENOUS | Status: AC
  Administered 2019-08-12: 15:00:00 50 g via INTRAVENOUS
  Filled 2019-08-12 (×2): qty 50

## 2019-08-12 NOTE — Progress Notes (Signed)
[facility] ROUNDING NOTE   Subjective:   This is a 68 year old gentleman who was admitted with Covid pneumonia July 13, 2019 completed course of remdesivir convalescent plasma Decadron treated with vancomycin and cefepime from 08/01/2019 until 08/02/2019.  On 08/02/2019 patient developed an ileus.  His creatinine on 07/25/2019 was 1.5 which appears to be his baseline.  On 08/02/2019 started to increase.  On 08/02/2019 was found to have MSSA bacteremia and was started on Ancef.  His blood pressure has been marginal with drops in blood pressure to 70 mmHg.  His renal ultrasound showed no hydronephrosis on 08/07/2019. Pt had administration of intravenous contrast on 07/25/2019.  Off of vaso gtt, now only on 1 pressor levo gtt ~ 6 ug/min today.   Starting back on systemic IV heparin for afib  No sig UOP. Continues on CRRT with no fluid off. D#6     Objective:  Vital signs in last 24 hours:  Temp:  [97.4 F (36.3 C)-98.7 F (37.1 C)] 97.4 F (36.3 C) (10/20 0800) Pulse Rate:  [62-90] 90 (10/20 1216) Resp:  [6-32] 15 (10/20 1216) BP: (88-148)/(52-65) 88/57 (10/20 1216) SpO2:  [92 %-100 %] 96 % (10/20 1216) Arterial Line BP: (101-169)/(46-63) 111/50 (10/20 1000) FiO2 (%):  [45 %-50 %] 45 % (10/20 1216) Weight:  [91.2 kg] 91.2 kg (10/20 0500)  Exam:   Patient not examined directly given COVID-19 + status, utilizing exam of the primary team and observations of RN's.    Assessment/ Plan:   Acute kidney injury with a baseline of chronic kidney disease with baseline serum creatinine about 1.5 mg/dL.  Admitted with Covid pneumonia complicated course with recent history of MSSA bacteremia.  Developed shock hypotension and required pressors.  No evidence of hydronephrosis on renal ultrasound. Suspected ATN.  Could not identify any additional nephrotoxins. There was administration of contrast but this was early on the course of his illness on 07/25/2019. He developed progressive  metabolic acidosis and was started on CRRT 08/07/2019. This is D#6   IV heparin resumed today systemic for afib, pharm will overlap hep w/ citrate for a period of time and then will dc citrate later when appropriate  Total I/O and wt's are mostly stable/ even, no vol excess on exam, will continue to keep even as tolerated  Metabolic acidosis- resolved  Anemia -as per primary team transfuse as necessary no transfusion requirements at this time  COVID-19 pneumonia-  Treated with remdesivir, dexamethasone and convalescent plasma.  Shock - weaning pressors down, BP's low 100's on levo gtt only  MSSA bacteremia-  vancomycin and anidulafungin have been discontinued.  Continues on cefepime   LOS: 15 Ilan Kahrs D Kierston Plasencia @TODAY @8 :11 PM   Basic Metabolic Panel: Recent Labs  Lab 08/08/19 0600  08/09/19 0502  08/10/19 0500  08/10/19 1540  08/11/19 0500  08/11/19 1550  08/12/19 0154 08/12/19 0202 08/12/19 0424 08/12/19 0428 08/12/19 0432 08/12/19 0500  NA 144  141   < > 144   < > 141  143   < > 139   < > 139  139   < > 140   < > 139 139 138 140 141 140  140  K 4.8  4.7   < > 5.0   < > 4.9  5.0   < > 4.2   < > 4.5  4.5   < > 4.4   < > 4.1 4.0 3.9 3.6 4.0 4.0  4.0  CL 107  105   < >  109   < > 103  104   < > 97*   < > 93*  93*   < > 92*   < > 92* 93* 96*  --  93* 95*  94*  CO2 25  26   < > 22   < > 25  25  --  30  --  33*  34*  --  35*  --   --   --   --   --   --  33*  32  GLUCOSE 191*  197*   < > 172*   < > 188*  187*   < > 173*   < > 130*  129*   < > 190*   < > 186* 220* 194*  --  228* 215*  216*  BUN 113*  88*   < > 83*   < > 94*  93*   < > 74*   < > 66*  66*   < > 53*   < > 40* 32* 37*  --  31* 45*  46*  CREATININE 3.13*  3.36*   < > 2.67*   < > 2.81*  2.82*   < > 2.31*   < > 2.49*  2.50*   < > 2.20*   < > 1.90* 1.50* 1.80*  --  1.30* 1.89*  1.91*  CALCIUM 7.7*  7.5*   < > 7.7*   < > 8.5*  8.6*  --  8.8*  --  9.3  9.3  --  9.0  --   --   --   --   --   --   7.3*  7.3*  MG 2.9*  --  2.8*  --  2.5*  --   --   --  2.3  --   --   --   --   --   --   --   --  2.1  PHOS 3.1  3.1   < > 4.4   < > 6.1*  6.2*  --  5.6*  --  5.0*  4.8*  --  3.3  --   --   --   --   --   --  2.6   < > = values in this interval not displayed.    Liver Function Tests: Recent Labs  Lab 08/08/19 0600  08/09/19 0502  08/10/19 0500 08/10/19 1540 08/11/19 0500 08/11/19 1550 08/12/19 0500  AST 45*  --  39  --  34  --  37  --  32  ALT 5  --  5  --  9  --  10  --  9  ALKPHOS 93  --  91  --  61  --  65  --  63  BILITOT 0.5  --  0.5  --  0.3  --  0.4  --  0.5  PROT 5.7*  --  5.7*  --  5.6*  --  5.9*  --  5.9*  ALBUMIN 2.6*  2.8*   < > 2.4*   < > 2.2*  2.2* 2.3* 2.2*  2.3* 2.2* 2.1*  2.1*   < > = values in this interval not displayed.   No results for input(s): LIPASE, AMYLASE in the last 168 hours. No results for input(s): AMMONIA in the last 168 hours.  CBC: Recent Labs  Lab 08/08/19 0600  08/09/19 0502  08/09/19 1319  08/10/19 0500  08/11/19 0500  08/12/19 0202 08/12/19 0424  08/12/19 0428 08/12/19 0432 08/12/19 0500  WBC 10.2  --  10.1  --  10.0  --  8.7  --  8.6  --   --   --   --   --  8.3  NEUTROABS 9.6*  --  9.2*  --   --   --  7.7  --  6.9  --   --   --   --   --  6.7  HGB 7.2*   < > 7.9*   < > 8.3*   < > 7.3*   < > 7.6*   < > 7.8* 8.5* 8.5* 9.5* 7.5*  HCT 22.8*   < > 24.6*   < > 25.7*   < > 23.1*   < > 23.8*   < > 23.0* 25.0* 25.0* 28.0* 24.2*  MCV 97.4  --  94.3  --  95.9  --  97.1  --  97.9  --   --   --   --   --  99.2  PLT 187  --  131*  --  128*  --  100*  --  114*  --   --   --   --   --  119*   < > = values in this interval not displayed.    Cardiac Enzymes: No results for input(s): CKTOTAL, CKMB, CKMBINDEX, TROPONINI in the last 168 hours.  BNP: Invalid input(s): POCBNP  CBG: Recent Labs  Lab 08/11/19 1550 08/11/19 1959 08/12/19 0025 08/12/19 0742 08/12/19 1139  GLUCAP 181* 184* 188* 222* 199*    Microbiology: Results  for orders placed or performed during the hospital encounter of 07/25/19  Blood Culture (routine x 2)     Status: None   Collection Time: 07/25/19  4:56 PM   Specimen: Left Antecubital; Blood  Result Value Ref Range Status   Specimen Description LEFT ANTECUBITAL  Final   Special Requests   Final    BOTTLES DRAWN AEROBIC AND ANAEROBIC Blood Culture results may not be optimal due to an excessive volume of blood received in culture bottles   Culture   Final    NO GROWTH 5 DAYS Performed at [facility], [address] [place], [place] [address]    Report Status 07/30/2019 FINAL  Final  Blood Culture (routine x 2)     Status: None   Collection Time: 07/25/19  5:01 PM   Specimen: BLOOD RIGHT HAND  Result Value Ref Range Status   Specimen Description BLOOD RIGHT HAND  Final   Special Requests   Final    BOTTLES DRAWN AEROBIC AND ANAEROBIC Blood Culture adequate volume   Culture   Final    NO GROWTH 5 DAYS Performed at [facility], [address] [place], [place] [address]    Report Status 07/30/2019 FINAL  Final  MRSA PCR Screening     Status: None   Collection Time: 07/26/19  2:09 AM   Specimen: Nasal Mucosa; Nasopharyngeal  Result Value Ref Range Status   MRSA by PCR NEGATIVE NEGATIVE Final    Comment:        The GeneXpert MRSA Assay (FDA approved for NASAL specimens only), is one component of a comprehensive MRSA colonization surveillance program. It is not intended to diagnose MRSA infection nor to guide or monitor treatment for MRSA infections. Performed at [facility], [place] [address] [place], [place] [address]   Culture, blood (Routine X 2) w Reflex to ID Panel  Status: Abnormal   Collection Time: 08/01/19 12:15 PM   Specimen: BLOOD  Result Value Ref Range Status   Specimen Description   Final    BLOOD RIGHT ARM Performed at [facility] [address] [place], [place] [address]    Special Requests   Final    BOTTLES  DRAWN AEROBIC AND ANAEROBIC Blood Culture adequate volume Performed at [facility] [address], [place], [place] [address]    Culture  Setup Time   Final    GRAM POSITIVE COCCI IN CLUSTERS IN BOTH AEROBIC AND ANAEROBIC BOTTLES CRITICAL RESULT CALLED TO, READ BACK BY AND VERIFIED WITH: C. SHADE PHARMD, AT 7408 08/02/19 BY Rush Landmark Performed at [facility], [place] [address] [place], [place] [address]    Culture STAPHYLOCOCCUS AUREUS (A)  Final   Report Status 08/04/2019 FINAL  Final   Organism ID, Bacteria STAPHYLOCOCCUS AUREUS  Final      Susceptibility   Staphylococcus aureus - MIC*    CIPROFLOXACIN <=0.5 SENSITIVE Sensitive     ERYTHROMYCIN <=0.25 SENSITIVE Sensitive     GENTAMICIN <=0.5 SENSITIVE Sensitive     OXACILLIN 0.5 SENSITIVE Sensitive     TETRACYCLINE <=1 SENSITIVE Sensitive     VANCOMYCIN 1 SENSITIVE Sensitive     TRIMETH/SULFA <=10 SENSITIVE Sensitive     CLINDAMYCIN <=0.25 SENSITIVE Sensitive     RIFAMPIN <=0.5 SENSITIVE Sensitive     Inducible Clindamycin NEGATIVE Sensitive     * STAPHYLOCOCCUS AUREUS  Blood Culture ID Panel (Reflexed)     Status: Abnormal   Collection Time: 08/01/19 12:15 PM  Result Value Ref Range Status   Enterococcus species NOT DETECTED NOT DETECTED Final   Listeria monocytogenes NOT DETECTED NOT DETECTED Final   Staphylococcus species DETECTED (A) NOT DETECTED Final    Comment: CRITICAL RESULT CALLED TO, READ BACK BY AND VERIFIED WITH: C. SHADE PHARMD, AT [phone] 08/02/19 BY D. VANHOOK    Staphylococcus aureus (BCID) DETECTED (A) NOT DETECTED Final    Comment: Methicillin (oxacillin) susceptible Staphylococcus aureus (MSSA). Preferred therapy is anti staphylococcal beta lactam antibiotic (Cefazolin or Nafcillin), unless clinically contraindicated. CRITICAL RESULT CALLED TO, READ BACK BY AND VERIFIED WITH: C. SHADE PHARMD, AT 1497 08/02/19 BY D. VANHOOK    Methicillin resistance NOT DETECTED NOT DETECTED Final    Streptococcus species NOT DETECTED NOT DETECTED Final   Streptococcus agalactiae NOT DETECTED NOT DETECTED Final   Streptococcus pneumoniae NOT DETECTED NOT DETECTED Final   Streptococcus pyogenes NOT DETECTED NOT DETECTED Final   Acinetobacter baumannii NOT DETECTED NOT DETECTED Final   Enterobacteriaceae species NOT DETECTED NOT DETECTED Final   Enterobacter cloacae complex NOT DETECTED NOT DETECTED Final   Escherichia coli NOT DETECTED NOT DETECTED Final   Klebsiella oxytoca NOT DETECTED NOT DETECTED Final   Klebsiella pneumoniae NOT DETECTED NOT DETECTED Final   Proteus species NOT DETECTED NOT DETECTED Final   Serratia marcescens NOT DETECTED NOT DETECTED Final   Haemophilus influenzae NOT DETECTED NOT DETECTED Final   Neisseria meningitidis NOT DETECTED NOT DETECTED Final   Pseudomonas aeruginosa NOT DETECTED NOT DETECTED Final   Candida albicans NOT DETECTED NOT DETECTED Final   Candida glabrata NOT DETECTED NOT DETECTED Final   Candida krusei NOT DETECTED NOT DETECTED Final   Candida parapsilosis NOT DETECTED NOT DETECTED Final   Candida tropicalis NOT DETECTED NOT DETECTED Final    Comment: Performed at [facility], [place] [address] [place], [place] [address]  Culture, blood (  Routine X 2) w Reflex to ID Panel     Status: Abnormal   Collection Time: 08/01/19 12:21 PM   Specimen: BLOOD  Result Value Ref Range Status   Specimen Description   Final    BLOOD RIGHT HAND Performed at [facility] [address], [place], [place] [address]    Special Requests   Final    BOTTLES DRAWN AEROBIC AND ANAEROBIC Blood Culture adequate volume Performed at [facility] [address] [place], [place] [address]    Culture  Setup Time   Final    GRAM POSITIVE COCCI IN CLUSTERS IN BOTH AEROBIC AND ANAEROBIC BOTTLES CRITICAL RESULT CALLED TO, READ BACK BY AND VERIFIED WITH: C. SHADE PHARMD, AT 2979 08/02/19 BY D. VANHOOK    Culture (A)  Final     STAPHYLOCOCCUS AUREUS SUSCEPTIBILITIES PERFORMED ON PREVIOUS CULTURE WITHIN THE LAST 5 DAYS. Performed at [facility], [place] [address] [place] [address] [place], [place] [address]    Report Status 08/04/2019 FINAL  Final  Culture, respiratory     Status: None   Collection Time: 08/01/19  3:30 PM   Specimen: Tracheal Aspirate  Result Value Ref Range Status   Specimen Description   Final    TRACHEAL ASPIRATE Performed at [facility] [address], [place], [place] [address]    Special Requests   Final    NONE Performed at [facility], [place] [address], [place], [place] [address]    Gram Stain   Final    RARE WBC PRESENT, PREDOMINANTLY PMN ABUNDANT GRAM POSITIVE COCCI IN CLUSTERS Performed at [facility], [place] [address], [place], [place] [address]    Culture   Final    MODERATE STAPHYLOCOCCUS AUREUS RARE PSEUDOMONAS AERUGINOSA    Report Status 08/06/2019 FINAL  Final   Organism ID, Bacteria STAPHYLOCOCCUS AUREUS  Final   Organism ID, Bacteria PSEUDOMONAS AERUGINOSA  Final      Susceptibility   Pseudomonas aeruginosa - MIC*    CEFTAZIDIME 4 SENSITIVE Sensitive     CIPROFLOXACIN 0.5 SENSITIVE Sensitive     GENTAMICIN <=1 SENSITIVE Sensitive     IMIPENEM 1 SENSITIVE Sensitive     PIP/TAZO 16 SENSITIVE Sensitive     CEFEPIME 4 SENSITIVE Sensitive     * RARE PSEUDOMONAS AERUGINOSA   Staphylococcus aureus - MIC*    CIPROFLOXACIN <=0.5 SENSITIVE Sensitive     ERYTHROMYCIN <=0.25 SENSITIVE Sensitive     GENTAMICIN <=0.5 SENSITIVE Sensitive     OXACILLIN 0.5 SENSITIVE Sensitive     TETRACYCLINE <=1 SENSITIVE Sensitive     VANCOMYCIN <=0.5 SENSITIVE Sensitive     TRIMETH/SULFA <=10 SENSITIVE Sensitive     CLINDAMYCIN <=0.25 SENSITIVE Sensitive     RIFAMPIN <=0.5 SENSITIVE Sensitive     Inducible Clindamycin NEGATIVE Sensitive     * MODERATE STAPHYLOCOCCUS AUREUS  Culture, Urine     Status: Abnormal   Collection Time: 08/01/19  5:45 PM    Specimen: Urine, Catheterized  Result Value Ref Range Status   Specimen Description   Final    URINE, CATHETERIZED Performed at [facility] [address], [place], [place] [address]    Special Requests   Final    NONE Performed at [facility], [place] [address] [place], [place] [address]    Culture (A)  Final    <10,000 COLONIES/mL INSIGNIFICANT GROWTH Performed at [facility] [address] [place],  [address]  Report Status 08/03/2019 FINAL  Final  MRSA PCR Screening     Status: None   Collection Time: 08/02/19 10:02 AM   Specimen: Nasal Mucosa; Nasopharyngeal  Result Value Ref Range Status   MRSA by PCR NEGATIVE NEGATIVE Final    Comment:        The GeneXpert MRSA Assay (FDA approved for NASAL specimens only), is one component of a comprehensive MRSA colonization surveillance program. It is not intended to diagnose MRSA infection nor to guide or monitor treatment for MRSA infections. Performed at [facility], [place] [address], [place], [place] [address]   Culture, blood (routine x 2)     Status: None   Collection Time: 08/05/19  5:00 AM   Specimen: BLOOD RIGHT HAND  Result Value Ref Range Status   Specimen Description   Final    BLOOD RIGHT HAND Performed at [facility] [address] [place], [place] [address]    Special Requests   Final    BOTTLES DRAWN AEROBIC ONLY Blood Culture adequate volume Performed at [facility] [address] [place], [place] [address]    Culture   Final    NO GROWTH 5 DAYS Performed at [facility], [place] [address], [place], [place] [address]    Report Status 08/10/2019 FINAL  Final  Culture, blood (routine x 2)     Status: None   Collection Time: 08/05/19  5:05 AM   Specimen: BLOOD LEFT HAND  Result Value Ref Range Status   Specimen Description   Final    BLOOD LEFT HAND Performed at [place] [address] [place], [place] [address]    Special Requests   Final    BOTTLES DRAWN AEROBIC ONLY Blood Culture adequate volume Performed at [facility] [address], [place], [place] [address]    Culture   Final    NO GROWTH 5 DAYS Performed at [facility], [place] [address] [place],  [address]    Report Status 08/10/2019 FINAL  Final    Coagulation Studies: No results for input(s): LABPROT, INR in the last 72 hours.  Urinalysis: No results for input(s): COLORURINE, LABSPEC, PHURINE, GLUCOSEU, HGBUR, BILIRUBINUR, KETONESUR, PROTEINUR, UROBILINOGEN, NITRITE, LEUKOCYTESUR in the last 72 hours.  Invalid input(s): APPERANCEUR    Imaging: Dg Chest Port 1 View  Result Date: 08/12/2019 CLINICAL DATA:  Respiratory failure. EXAM: PORTABLE CHEST 1 VIEW COMPARISON:  Chest x-ray 08/10/2019. FINDINGS: Endotracheal tube, dual-lumen left IJ line, right PICC line in stable position. Right lower lobe bronchus balloon occlusion catheter stable position. Right chest tube in stable position. Small right base pneumothorax cannot be excluded on today's exam. Bibasilar atelectasis/infiltrates again noted. No interim change. Tiny bilateral pleural effusions. IMPRESSION: 1. Endotracheal tube, dual-lumen left IJ line, right PICC line stable position. Right lower lobe bronchus balloon occlusion catheter in stable position. Right chest tube in stable position. 2. Small right base pneumothorax cannot be excluded on today's exam. 3. Bibasilar atelectasis/infiltrates again noted without interim change. Critical Value/emergent results were called by telephone at the time of interpretation on 08/12/2019 at 7:31 am to the patient's nurse, who verbally acknowledged these results. Electronically Signed   By: Marcello Moores  Register   On: 08/12/2019 07:33   Dg Chest Port 1 View  Result Date: 08/10/2019 CLINICAL DATA:  COVID positive EXAM: PORTABLE CHEST 1 VIEW COMPARISON:   08/09/2019 FINDINGS: Left lung apex and lateral costophrenic angle are partially visualized.  Endotracheal tube terminates 5 cm above the carina. Right chest tube.  No pneumothorax is seen. Left IJ dual lumen catheter terminates in the mid SVC. Right subclavian venous catheter terminates at the cavoatrial junction. Enteric tube terminates in the proximal stomach. Stable selective balloon occlusion catheter in the right lower lobe bronchus. Multifocal patchy opacities, lower lung predominant, grossly unchanged. No definite pleural effusions. The heart is normal in size. IMPRESSION: Multifocal patchy opacities, grossly unchanged, compatible with pneumonia in this patient with known COVID. Stable right chest tube.  No pneumothorax is seen. Endotracheal tube terminates 5 cm above the carina. Additional stable support apparatus as above. Electronically Signed   By: Julian Hy M.D.   On: 08/10/2019 16:59     Medications:   .  prismasol BGK 4/2.5 500 mL/hr at 08/11/19 1950  . sodium chloride 10 mL/hr at 08/09/19 1000  . amiodarone 30 mg/hr (08/12/19 1200)  . calcium gluconate infusion for CRRT 70 mL/hr at 08/12/19 0216  . [START ON 08/13/2019]  ceFAZolin (ANCEF) IV    . ceFEPime (MAXIPIME) IV Stopped (08/12/19 0216)  . cisatracurium (NIMBEX) infusion 2.5 mcg/kg/min (08/12/19 1200)  . feeding supplement (PIVOT 1.5 CAL) 1,000 mL (08/12/19 0800)  . fentaNYL infusion INTRAVENOUS 175 mcg/hr (08/12/19 1200)  . heparin 1,300 Units/hr (08/12/19 1200)  . midazolam 6 mg/hr (08/12/19 1200)  . norepinephrine (LEVOPHED) Adult infusion 6 mcg/min (08/12/19 1225)  . prismasol B22GK 4/0 2,000 mL/hr at 08/12/19 1205  . sodium citrate 2 %/dextrose 2.5% solution 3000 mL 420 mL/hr at 08/12/19 1207  . vasopressin (PITRESSIN) infusion - *FOR SHOCK* Stopped (08/10/19 5701)   . artificial tears  1 application Both Eyes X7L  . chlorhexidine  15 mL Mouth/Throat BID  . Chlorhexidine Gluconate Cloth  6 each Topical Daily   . feeding supplement (PRO-STAT SUGAR FREE 64)  60 mL Per Tube TID  . hydrocortisone sodium succinate  50 mg Intravenous Q8H  . insulin aspart  0-9 Units Subcutaneous Q4H  . mouth rinse  15 mL Mouth Rinse 10 times per day  . oxyCODONE  5 mg Oral Q6H  . pantoprazole (PROTONIX) IV  40 mg Intravenous Q12H  . polyethylene glycol  17 g Per Tube BID  . QUEtiapine  25 mg Oral BID  . sennosides  5 mL Per Tube BID  . sucralfate  1 g Oral Q6H  . tamsulosin  0.4 mg Oral BID   Place/Maintain arterial line **AND** sodium chloride, acetaminophen (TYLENOL) oral liquid 160 mg/5 mL, acetaminophen, fentaNYL, heparin, influenza vaccine adjuvanted, midazolam, pneumococcal 23 valent vaccine, sodium chloride

## 2019-08-12 NOTE — Progress Notes (Signed)
ANTICOAGULATION CONSULT NOTE - Initial Consult  Pharmacy Consult for Heparin Indication: atrial fibrillation  No Known Allergies  Patient Measurements: Height: 5\' 10"  (177.8 cm) Weight: 201 lb 1.6 oz (91.2 kg) IBW/kg (Calculated) : 73 Heparin Dosing Weight: 90 kg  Vital Signs: Temp: 97.4 F (36.3 C) (10/20 0800) Temp Source: Oral (10/20 0800) BP: 95/60 (10/20 1000) Pulse Rate: 86 (10/20 1000)  Labs: Recent Labs    08/10/19 0500  08/11/19 0500  08/12/19 0424 08/12/19 0428 08/12/19 0432 08/12/19 0500  HGB 7.3*   < > 7.6*   < > 8.5* 8.5* 9.5* 7.5*  HCT 23.1*   < > 23.8*   < > 25.0* 25.0* 28.0* 24.2*  PLT 100*  --  114*  --   --   --   --  119*  CREATININE 2.81*  2.82*   < > 2.49*  2.50*   < > 1.80*  --  1.30* 1.89*  1.91*   < > = values in this interval not displayed.    Estimated Creatinine Clearance: 43.2 mL/min (A) (by C-G formula based on SCr of 1.91 mg/dL (H)).   Medical History: Past Medical History:  Diagnosis Date  . COVID-19   . Heartburn     Assessment: 81 YOM diagnosed with COVID-19 with new onset atrial fibrillation with RVR. No anticoagulation PTA. During this admission, patient had an upper GI bleed 2/2 gastric ulcer. He is now s/p EGD with clipping with no evidence of active bleeding. SQ heparin was restarted 10/19, last dose given this AM at 0536. Patient also on CRRT using sodium citrate for circuit anticoagulation. Pharmacy asked to assist with switch from sodium citrate to IV heparin.  CBC low but stable today with Hgb 7.5 and Plts 119. No overt bleeding noted. Patient remains on sodium citrate for CRRT and filter has not clotted since 10/18. Will start heparin with no bolus and lower therapeutic goal given recent GIB.  Goal of Therapy:  Heparin level 0.3-0.5 units/ml  Monitor platelets by anticoagulation protocol: Yes   Plan:  - No bolus given recent GIB - Start Heparin IV at 1300 units/hr - Check 6-hr HL - Daily HL and CBC - Monitor for  signs of bleeding - Plan to stop sodium citrate once HL therapeutic  Richardine Service, PharmD PGY1 Pharmacy Resident 08/12/2019  11:33 AM

## 2019-08-12 NOTE — Progress Notes (Signed)
Endobronchial blocker remains secured at 57.5. RT will continue to monitor

## 2019-08-12 NOTE — Progress Notes (Signed)
Spoke with pt's wife over the phone.  Updated about current status and treatment plan.  Explained that he still has small air leak and might eventually need endobronchial valve.  Chesley Mires, MD [facility] Pulmonary/Critical Care 08/12/2019, 1:32 PM

## 2019-08-12 NOTE — Progress Notes (Signed)
Pt's wife, [name], called and given update on Mr Raisanen' condition. All questions answered.

## 2019-08-12 NOTE — Progress Notes (Signed)
Endobronchial blocker remains secured at 57.5. RT will continue to monitor.

## 2019-08-12 NOTE — Progress Notes (Signed)
ANTICOAGULATION CONSULT NOTE - Follow Up Consult  Pharmacy Consult for Heparin Indication: atrial fibrillation  No Known Allergies  Patient Measurements: Height: 5\' 10"  (177.8 cm) Weight: 201 lb 1.6 oz (91.2 kg) IBW/kg (Calculated) : 73 Heparin Dosing Weight: 90 kg  Vital Signs: Temp: 98.2 F (36.8 C) (10/20 1700) Temp Source: Oral (10/20 1700) BP: 113/63 (10/20 1705) Pulse Rate: 70 (10/20 1705)  Labs: Recent Labs    08/10/19 0500  08/11/19 0500  08/12/19 0432 08/12/19 0500  08/12/19 1516 08/12/19 1530 08/12/19 1600 08/12/19 1657 08/12/19 1800  HGB 7.3*   < > 7.6*   < > 9.5* 7.5*   < > 9.5* 7.8*  --  8.2*  --   HCT 23.1*   < > 23.8*   < > 28.0* 24.2*   < > 28.0* 23.0*  --  24.0*  --   PLT 100*  --  114*  --   --  119*  --   --   --   --   --   --   HEPARINUNFRC  --   --   --   --   --   --   --   --   --   --   --  0.41  CREATININE 2.81*  2.82*   < > 2.49*  2.50*   < > 1.30* 1.89*  1.91*  --   --   --  1.81*  --   --    < > = values in this interval not displayed.    Estimated Creatinine Clearance: 45.6 mL/min (A) (by C-G formula based on SCr of 1.81 mg/dL (H)).   Medical History: Past Medical History:  Diagnosis Date  . COVID-19   . Heartburn     Assessment: 72 YOM diagnosed with COVID-19 with new onset atrial fibrillation with RVR. No anticoagulation PTA. During this admission, patient had an upper GI bleed 2/2 gastric ulcer. He is now s/p EGD with clipping with no evidence of active bleeding. SQ heparin was restarted 10/19, last dose given this AM at 0536. Patient also on CRRT using sodium citrate for circuit anticoagulation. Pharmacy asked to assist with switch from sodium citrate to IV heparin.  CBC low but stable today with Hgb 7.5 and Plts 119. No overt bleeding noted. Patient remains on sodium citrate for CRRT and filter has not clotted since 10/18. Will start heparin with no bolus and lower therapeutic goal given recent GIB.  6:40 PM  Heparin level  is therapeutic 0.41, no bleeding or complications reported.  Goal of Therapy:  Heparin level 0.3-0.5 units/ml  Monitor platelets by anticoagulation protocol: Yes   Plan:  - Continue Heparin IV at 1300 units/hr - Check 6-hr HL to confirm therapeutic - Daily HL and CBC - Monitor for signs of bleeding - Per Rx discussion with Nephrology today, plan to stop sodium citrate once HL therapeutic - see orders.  Peggyann Juba, PharmD, BCPS Pharmacy: [phone] 08/12/2019  6:39 PM

## 2019-08-12 NOTE — Progress Notes (Signed)
NAME:  [name], MRN:  [id number], DOB:  Dec 29, 1950, LOS: 58 ADMISSION DATE:  07/25/2019, CONSULTATION DATE:  07/26/2019 REFERRING MD:  Bridgett Larsson, [facility] COMPLAINT:  Dyspnea   Brief History   68 yo male dx with COVID 07/22/19 presented to [facility] ER 07/25/19 with progressive dyspnea and hypoxia requiring intubation from COVID 19 pneumonia.  Past Medical History  GERD  Significant Hospital Events   10/03 Admit to [facility] from [facility] ER, start decadron and remdesivir, given tociluzimab; prone positioning 10/04 convalescent plasma 10/05 stop prone positioning 10/06 convalescent plasma; prone positioning again 10/09 start ABx 10/10 MSSA bacteremia, CVL d/ced; ID consulted; increase OG tube outpt 10/11 vent weaning trial started 10/13 fever, pneumothorax, pig tail chest tube placed 10/14 worsening hypotension and renal fx >> consulted nephrology; worsening PTX >> replaced chest tube; GNR in sputum >> ABx changed 10/15 start CRRT; endobronchial blocker placed for persistent air leak 10/16 endobronchial blocker repositioned, changed chest tube to water seal; melana with ABLA >> GI consulted; transfuse PRBC 10/17 persistent air leak, increased WOB; start nimbex gtt >> air leak decreased; EGD; A fib with RVR >> start amiodarone 10/18 transfuse PRBC; GI s/o 10/20 resume heparin gtt  Consults:  ID Nephrology Gastroenterology  Procedures:  10/2 ETT>  10/2 R IJ CVL > 10/10 10/13 Rt pig tail chest tube >> 10/14 10/14 R 7F chest tube >  10/14 R subclavian CVL >  10/14 L IJ HD cath >  10/14 Lt radial a line >   Significant Diagnostic Tests:  10/2 CT angiogram chest > extensive bilateral airspace disease predominantly posterior 10/8 doppler legs b/l > no DVT 10/15 renal u/s > normal 10/17 EGD > non bleeding gastric ulcer  10/19 Echo >> EF 60 to 65%, hyperdynamic LV with small LVOT gradient  Micro Data:  9/20 SARS COV 2 > POSITIVE 10/2 blood > negative 10/9 blood > MSSA 2/4 10/9 resp >  MSSA, Pseudomonas 10/13 blood >> negative  Antimicrobials/COVID Rx  10/3 remdesivir > 10/6 10/3 actemra 10/3 decadron > 10/12 10/4 convalescent plasma 10/6 convalescent plasma  10/9 vancomycin > 10/10 10/9 cefepime > 10/10 10/10 ancef > 10/13 10/13 cefepime >  10/14 anidulofungin > 10/15  Interim history/subjective:  Remains on CRRT, nimbex, pressors.  Has 1 out of 7 air leak with Rt chest tube.  Vti matching Vte.  Endobronchial blocker at 57.5 cm.  PaO2:FiO2 122.  Objective   Blood pressure 102/60, pulse 73, temperature (!) 97.4 F (36.3 C), temperature source Oral, resp. rate (!) 31, height 5\' 10"  (1.778 m), weight 91.2 kg, SpO2 96 %. CVP:  [1 mmHg-24 mmHg] 8 mmHg  Vent Mode: PRVC FiO2 (%):  [45 %-50 %] 45 % Set Rate:  [24 bmp-28 bmp] 24 bmp Vt Set:  [480 mL] 480 mL PEEP:  [8 cmH20-10 cmH20] 8 cmH20 Plateau Pressure:  [26 cmH20-30 cmH20] 26 cmH20   Intake/Output Summary (Last 24 hours) at 08/12/2019 0933 Last data filed at 08/12/2019 0900 Gross per 24 hour  Intake 4119.08 ml  Output 4475 ml  Net -355.92 ml    Filed Weights   08/10/19 0500 08/11/19 0415 08/12/19 0500  Weight: 96 kg 93.6 kg 91.2 kg    Examination:  General - sedated, chemically paralyzed Eyes - pupils reactive ENT - ETT in place Cardiac - irregular Chest - b/l rhonchi Abdomen - soft, non tender, + bowel sounds Extremities - 1+ edema Skin - no rashes Neuro - RASS -5  CXR (reviewed by me) - b/l ASD,  small Rt PTX   Resolved Hospital Problem list     Assessment & Plan:   Acute hypoxic respiratory failure from COVID 19 pneumonia with ARDS complicated by MSSA/Pseudomonal HCAP and Rt tension PTX with persistent air leak. - goal SpO2 88 to 95% - decrease RR to 24 in setting of alkalosis - trying to minimize PEEP >> keeping at 8 cm H2O - f/u CXR, ABG - keep endobronchial blocker inflated with 2 cc at 57.5 cm - likely will need endobronchial valve placement later this week - continue nimbex  for now  MSSA HCAP with bacteremia, Pseudomonal HCAP. - complete 7 days of cefepime for Pseudomonal HCAP - will need to complete 6 weeks total of ABx for MSSA bacteremia >> transition to ancef once cefepime completed  Multifactorial shock. - from sepsis, tension PTX, relative adrenal insufficiency, ABLA, medications and possibly cardiac - wean pressors to keep MAP > 65 - continue solu cortef while on pressors  New onset A fib with RVR 10/17. - continue amiodarone - resume heparin gtt 10/20  AKI from ATN. - CRRT per renal >> might be able to transition from citrate to heparin for CRRT  Acute blood loss anemia from Upper GI bleed 2nd to Gastric ulcer s/p EGD with clipping. - BID protonix - f/u CBC, transfuse for Hb < 7 or significant bleeding  Acute metabolic encephalopathy. - RASS goal -5 while on nimbex - continue schedule oxycodone, seroquel  Hyperglycemia. - SSI  Best practice:  Diet: tube feeding  DVT prophylaxis: heparin gtt GI prophylaxis: protonix Mobility: bed rest Code Status: DNR Disposition: ICU  Labs    CMP Latest Ref Rng & Units 08/12/2019 08/12/2019 08/12/2019  Glucose 70 - 99 mg/dL 215(H) 216(H) 228(H)  BUN 8 - 23 mg/dL 45(H) 46(H) 31(H)  Creatinine 0.61 - 1.24 mg/dL 1.89(H) 1.91(H) 1.30(H)  Sodium 135 - 145 mmol/L 140 140 141  Potassium 3.5 - 5.1 mmol/L 4.0 4.0 4.0  Chloride 98 - 111 mmol/L 95(L) 94(L) 93(L)  CO2 22 - 32 mmol/L 33(H) 32 -  Calcium 8.9 - 10.3 mg/dL 7.3(L) 7.3(L) -  Total Protein 6.5 - 8.1 g/dL 5.9(L) - -  Total Bilirubin 0.3 - 1.2 mg/dL 0.5 - -  Alkaline Phos 38 - 126 U/L 63 - -  AST 15 - 41 U/L 32 - -  ALT 0 - 44 U/L 9 - -   CBC Latest Ref Rng & Units 08/12/2019 08/12/2019 08/12/2019  WBC 4.0 - 10.5 K/uL 8.3 - -  Hemoglobin 13.0 - 17.0 g/dL 7.5(L) 9.5(L) 8.5(L)  Hematocrit 39.0 - 52.0 % 24.2(L) 28.0(L) 25.0(L)  Platelets 150 - 400 K/uL 119(L) - -   ABG    Component Value Date/Time   PHART 7.517 (H) 08/12/2019 0428    PCO2ART 36.9 08/12/2019 0428   PO2ART 55.0 (L) 08/12/2019 0428   HCO3 30.6 (H) 08/12/2019 0428   TCO2 27 08/12/2019 0432   ACIDBASEDEF 3.0 (H) 08/09/2019 1430   O2SAT 94.0 08/12/2019 0428   CBG (last 3)  Recent Labs    08/11/19 1959 08/12/19 0025 08/12/19 0742  GLUCAP 184* 188* 222*   D/w Dr. Sherral Hammers  CC time 30 minutes  Chesley Mires, MD [place] 08/12/2019, 9:33 AM

## 2019-08-12 NOTE — Progress Notes (Signed)
Endobronchial blocker secured @ 57.5. RT will continue to monitor.

## 2019-08-12 NOTE — Progress Notes (Signed)
PROGRESS NOTE    [name]  JOA:[id number] DOB: 24-Sep-1951 DOA: 07/25/2019 PCP: Kathyrn Drown, MD   Brief Narrative:  68 year old BM PMHx GERD, BPH   Presented to [facility] ER with shortness of breath and hypoxia.  Patient was diagnosed with COVID 19 on July 13 9019.  Patient's wife also has COVID-19 but is asymptomatic.  Over the last 1 to 2 days, the patient's shortness of breath is increased.  Patient has been suffering with anorexia.  Patient's wife was actually having a conversation via teleconference with the patient's PCP.  PCP is also otherwise physician.  He had actually called to check on how the wife was doing.  The wife had mentioned to the physician that the patient has been laying in bed.  When the physician listened to how the patient was speaking over the phone, it was noted the patient was tachypneic.  EMS was activated.  When EMS arrived, the patient's O2 saturations were apparently in the 40 percentile range.  Patient was placed on nonrebreather.  He was transferred to the ER.  In the ER, his oxygen saturations on 100% nonrebreather were only in the 80s.  Patient was confused.  Patient was emergently intubated.  Patient given IV steroids.  Initial chest x-ray demonstrates diffuse multifocal infiltrates.  CT scan of the chest was negative for PE.  ABG prior to leaving for [place] demonstrates a pH 7.41 PCO2 44 PO2 of 87 on FiO2 100%.  AA gradient calculates out to 571.  I personally called the patient's wife on the evening of July 25, 2019.  During that conversation we had discussed using IV steroids, IV remdesivir.  We also discussed off label use of Actemra and possibly confluence of plasma due to the patient's severe COVID-19 pneumonia.  She verbally agreed that the medical team should try anything possible to make the patient better.  Confirm with the wife the patient is a full code.    ED Course: emergently intubated. Central line placed. Started on  IV Decadron.    Subjective: 10/20 intubated/sedated.  RIGHT pneumothorax with a slight air leak 1/7    Assessment & Plan:   Principal Problem:   Pneumonia due to COVID-19 virus Active Problems:   BPH (benign prostatic hyperplasia)   Acute respiratory failure due to COVID-19 (HCC)   AKI (acute kidney injury) (HCC)   Fluid overload   Pneumothorax on right   Acute renal failure (ARF) (HCC)   GI bleed   Atrial fibrillation with RVR (HCC)   Constipation   Gastric ulcer with hemorrhage  COVID pneumonia/acute respiratory stress with hypoxia/bacterial superinfection - Continue Decadron - Continue Remdesivir per pharmacy protocol - 10/2 Actemra x1 dose - 10/3 counseled patient wife on CCP over phone and she has agreed to allow [place] to administer CCP.  Paperwork in chart  -10/3 transfuse 1 unit CCP -Unable to prone secondary to RIGHT pneumothorax with fistula and air leak.    Recent Labs  Lab 08/08/19 0600 08/09/19 0502 08/10/19 0500 08/11/19 0500 08/12/19 0500  CRP 10.4* 5.4* 4.8* 4.2* 5.5*   Recent Labs  Lab 08/08/19 0600 08/09/19 0502 08/10/19 0500 08/11/19 0500 08/12/19 0500  DDIMER 3.84* 4.64* 12.24* >20.00* 11.03*  - Titrate patient's vent settings to maintain SPO2 88 to 93%, if possible.  Will use ARDS lung protective protocol (6 to 8 cc/kg/min). - Attempt to maintain plateau pressure<30 - Attempt to maintain driving pressure 15 - 10/6 transfuse 1 unit CCP (this is patient's second  unit) -10/10 MSSA bacteremia.  Antibiotics initiated per ID recommendations -10/13 PCXR large RIGHT sided pneumothorax; s/p chest tube placement with reinflation of lung.  High-volume leak (questionable minimal leak at insertion site improved with Vaseline gauze) -10/14 continue broad-spectrum antibiotic.  Antifungal discontinued -10/14 stress dose steroids added; Solu-Cortef 50 mg TID -10/15 s/p bronchoscopy with placement of endobronchial balloon right intermedius bronchi.  Patient  now with minimal air leak -10/16 s/p repeat bronchoscopy to adjust endobronchial balloon.  Airleak now again resealed.  Still minimal air leak. -10/17 although air leak appears to have worsened by observation of Pleur-evac, clinically patient stable.  This is most likely secondary to patient trying to initiate large breaths.  Will paralyze patient with Nimbex.  NOTE after paralyzation with Nimbex decreased air leak, this will allow patient's lung to hopefully heal without further intervention -10/20 slight air leak has returned.  Dr. Halford Chessman to discuss with Dr. [place] Bells about placing valve.  A. fib RVR -Had talked about cardioverting patient but given right pneumothorax with leak and endobronchial balloon in place would not chance procedure. -10/17 restart amiodarone drip -10/20 heparin increased to drip, monitor closely for GI bleed  Septic shock/Hypotension -Continued Norepinephrine and Vasopressin drip to maintain MAP>65 -10/20 albumin 50 g  GI bleed  Recent Labs  Lab 08/12/19 0202 08/12/19 0424 08/12/19 0428 08/12/19 0432 08/12/19 0500  HGB 7.8* 8.5* 8.5* 9.5* 7.5*  -Occult blood positive -10/14 Heparin SQ (hold).   -10/16 GI bleed continuing off Heparin for 48 hrs. . -10/16 Transfuse 1 unit PRBC -Discussed case with Dr. Gerilyn Nestle GI.  See her note from 10/16 -10/17 s/p EGD per Dr. Joannie [facility] GI large gastric ulcer with vessel successfully clipped see report below -10/18 H&H dropped again today.  Patient with low platelets. -10/18 transfuse 1 unit platelets  Gastric ulcer - See GI bleed -10/17 octreotide discontinued -Sucralfate 1 g  QID x1 week -Change Protonix infusion to 40 mg IV BID -Will need follow-up EGD in 2 to 3 months  Pneumoperitoneum? -Per Dr. [place] Bells radiology contacted him believes has pneumoperitoneum -Negative pneumoperitoneum on lateral decubitus x-ray.  Constipation (patient with large stool burden) -Per GI significant residual and  stomach - MiraLAX - Senokot  Fluid overload - Strict in and out +4.8 L -Daily weight Filed Weights   08/10/19 0500 08/11/19 0415 08/12/19 0500  Weight: 96 kg 93.6 kg 91.2 kg  - CVP daily -10/20 CVP = 14 -10/20 will attempt to remove fluid with CRRT  Acute renal failure - Avoid nephrotoxic medication Recent Labs  Lab 08/12/19 0154 08/12/19 0202 08/12/19 0424 08/12/19 0432 08/12/19 0500  CREATININE 1.90* 1.50* 1.80* 1.30* 1.89*   1.91*  -10/14 renal ultrasound pending -SPEP, UPEP, complements, c-ANCA, urine electrolytes, urine osmolality, serum osmolality, renal ultrasound pending - Spoke with Dr. Justin Mend nephrology will see patient first thing in the a.m. -10/15 Per nephrology begin CRRT  Metabolic acidosis -CRRT  BPH -Flomax 0.4 mg BID    Goals of care - CCP use; counseled Mrs. Warth that CCP is not improving therapy for COVID-19 though available data shows it may have benefit with high titer plasma is given.  Is unclear if it is effective other patients and with use of low titer plasma.  Risk and benefits of its use were explained to Mrs. Samuelson and she agreed to treatment..  All appropriate IRB consent paperwork has been signed and is in patient's chart.   DVT prophylaxis: Heparin per pharmacy; (heparin drip) Code Status: Full Family  Communication: 10/20 Dr. Chesley Mires spoke with family.   Disposition Plan: TBD   Consultants:  PCCM Nephrology   Procedures/Significant Events:  10/2 PCXR;-Endotracheal tube remains with tip above the thoracic inlet. Recommend repositioning and advancement. Esophagogastric tube with tip and side port below the diaphragm. - Unchanged diffuse, extensive bilateral heterogeneous opacity, consistent with multifocal infection, edema, and/or ARDS. 10/3 PCXR; slightly improved extensive bilateral heterogeneous opacifications (my read) 10/3 transfuse 1 unit CCP 10/5 transfuse 1 unit FFP 10/6 transfuse 1 unit CCP (this is patient's second  unit) 10/10 PCXR - Personally reviewed: Poor inspiration - mild improvement in aeration 10/11 PCXR - Personally reviewed: Left lower lobe appears to be improving; RLL stable 10/13 PCXR - Personally reviewed: Large R sided pneumothorax - re-expanded after chest tube placed overnight 10/14 PCXR;-small residual right apical pneumothorax, significantly decreased. -Stable extensive patchy opacities throughout both lungs 10/14 DG abdomen decubitus; negative evidence of pneumoperitoneum -Moderate gas in nondependent:; Nonobstructive bowel gas pattern 10/15 s/p bronchoscopy with placement of endobronchial balloon right intermedius bronchi 10/15 renal ultrasound; normal ultrasound 10/15 Per nephrology begin CRRT 10/15 s/p bronchoscopy place endobronchial balloon 10/16 s/p repeat bronchoscopy to adjust endobronchial balloon 10/16 transfuse 1 unit PRBC 10/16 Protonix drip 10/16 octreotide drip 10/17 s/p EGD-no active bleeding or blood in the stomach -Very large (700 mL) gastric residual, mostly tube feeding, without any anatomic gastric outlet obstruction, suggestive of gastric ileus  - Gastric ulcer at the angularis of lesser curve of stomach, approximately 1.5 to 2 cm in greatest dimension, cratered, benign-appearing, nonbleeding, with visible vessel but no adherent clot--successfully clipped 10/17 PCXR;-Redemonstrated extensive bilateral slightly nodular airspace opacities. Left basilar/retrocardiac consolidative opacities are unchanged. No new focal airspace opacities.  10/17 KUB;No free air, portal venous gas, or pneumatosis identified on supine<BR>imaging. An NG tube terminates in the stomach. Foreign body projects<BR>over the midline of the lower pelvis. No other abnormalities. 10/18 transfuse 1 unit platelets 10/20 restart heparin drip    I have personally reviewed and interpreted all radiology studies and my findings are as above. VENTILATOR SETTINGS: Vent mode; PRVC Vt Set; 480 Set  rate; 24 FiO2; 45% I time;  PEEP; 8  10/20 PF ratio= 153   Cultures 9/29 Novel coronavirus positive 10/2 blood LEFT antecubital NGTD 10/2 blood hand NGTD 10/3 MRSA by PCR negative     Antimicrobials: Anti-infectives (From admission, onward)   Start     Stop   08/13/19 0200  ceFAZolin (ANCEF) IVPB 2g/100 mL premix     09/15/19 2359   08/12/19 0200  ceFEPIme (MAXIPIME) 2 g in sodium chloride 0.9 % 100 mL IVPB     08/12/19 2359   08/07/19 2200  ceFEPIme (MAXIPIME) 2 g in sodium chloride 0.9 % 100 mL IVPB  Status:  Discontinued     08/11/19 1509   08/07/19 1000  anidulafungin (ERAXIS) 100 mg in sodium chloride 0.9 % 100 mL IVPB  Status:  Discontinued     08/08/19 1203   08/07/19 0800  ceFEPIme (MAXIPIME) 2 g in sodium chloride 0.9 % 100 mL IVPB  Status:  Discontinued     08/07/19 1540   08/06/19 1000  anidulafungin (ERAXIS) 200 mg in sodium chloride 0.9 % 200 mL IVPB    Note to Pharmacy: please   08/06/19 1600   08/06/19 0800  ceFEPIme (MAXIPIME) 2 g in sodium chloride 0.9 % 100 mL IVPB  Status:  Discontinued     08/06/19 1036   08/02/19 2200  ceFAZolin (ANCEF) IVPB 2g/100 mL premix  Status:  Discontinued     08/06/19 0733   08/02/19 1400  vancomycin (VANCOCIN) 1,250 mg in sodium chloride 0.9 % 250 mL IVPB  Status:  Discontinued     08/02/19 1332   08/01/19 1330  vancomycin (VANCOCIN) 2,000 mg in sodium chloride 0.9 % 500 mL IVPB     08/01/19 1636   08/01/19 1300  ceFEPIme (MAXIPIME) 2 g in sodium chloride 0.9 % 100 mL IVPB  Status:  Discontinued     08/02/19 1332   07/26/19 1600  remdesivir 100 mg in sodium chloride 0.9 % 250 mL IVPB     07/29/19 1823   07/26/19 0215  remdesivir 200 mg in sodium chloride 0.9 % 250 mL IVPB     07/26/19 0520       Devices    LINES / TUBES:  #7.5 ETT 10/2>> RIGHT IJ CVL>>> RIGHT #28 French chest tube 10/13>>> LEFT Ig HD cath 10/14>> RIGHT interlobar bronchial balloon 10/15/>>   Continuous Infusions:   prismasol BGK 4/2.5 500  mL/hr at 08/11/19 1950   sodium chloride 10 mL/hr at 08/09/19 1000   amiodarone 30 mg/hr (08/12/19 0624)   calcium gluconate infusion for CRRT 70 mL/hr at 08/12/19 0216   [START ON 08/13/2019]  ceFAZolin (ANCEF) IV     ceFEPime (MAXIPIME) IV Stopped (08/12/19 0216)   cisatracurium (NIMBEX) infusion 2.5 mcg/kg/min (08/12/19 0400)   feeding supplement (PIVOT 1.5 CAL) 1,000 mL (08/12/19 0400)   fentaNYL infusion INTRAVENOUS 150 mcg/hr (08/12/19 0400)   midazolam 6 mg/hr (08/12/19 0430)   norepinephrine (LEVOPHED) Adult infusion 3 mcg/min (08/12/19 0712)   prismasol B22GK 4/0 2,000 mL/hr at 08/12/19 0657   sodium citrate 2 %/dextrose 2.5% solution 3000 mL 420 mL/hr at 08/12/19 0438   vasopressin (PITRESSIN) infusion - *FOR SHOCK* Stopped (08/10/19 0906)     Objective: Vitals:   08/12/19 0415 08/12/19 0500 08/12/19 0600 08/12/19 0700  BP: (!) 148/61     Pulse: 78 62 64 62  Resp: (!) 28 (!) 28 15 (!) 28  Temp:   (!) 97.5 F (36.4 C)   TempSrc:   Oral   SpO2: 97% 98% 100% 100%  Weight:  91.2 kg    Height:        Intake/Output Summary (Last 24 hours) at 08/12/2019 0736 Last data filed at 08/12/2019 0700 Gross per 24 hour  Intake 4202.89 ml  Output 4629 ml  Net -426.11 ml   Filed Weights   08/10/19 0500 08/11/19 0415 08/12/19 0500  Weight: 96 kg 93.6 kg 91.2 kg  Physical Exam:  General: Sedated/intubated, positive respiratory distress Eyes: negative scleral hemorrhage, negative anisocoria, negative icterus ENT: Negative Runny nose, negative gingival bleeding, #7.5 ETT in place positive endobronchial below in position, positive airleak (minimal) Neck:  Negative scars, masses, torticollis, lymphadenopathy, JVD Lungs: Tachypneic clear to auscultation bilaterally without wheezes or crackles, RIGHT side chest tube in place positive minimal air leak Cardiovascular: Regular rate and rhythm without murmur gallop or rub normal S1 and S2 Abdomen: negative abdominal pain,  nondistended, positive soft, bowel sounds, no rebound, no ascites, no appreciable mass Extremities: No significant cyanosis, clubbing, or edema bilateral lower extremities Skin: Negative rashes, lesions, ulcers Psychiatric: Intubated/sedated Central nervous system: Intubated/sedated             Data Reviewed: Care during the described time interval was provided by me .  I have reviewed this patient's available data, including medical history, events of note, physical examination, and all test results as part of  my evaluation.   CBC: Recent Labs  Lab 08/08/19 0600  08/09/19 0502  08/09/19 1319  08/10/19 0500  08/11/19 0500  08/12/19 0202 08/12/19 0424 08/12/19 0428 08/12/19 0432 08/12/19 0500  WBC 10.2  --  10.1  --  10.0  --  8.7  --  8.6  --   --   --   --   --  8.3  NEUTROABS 9.6*  --  9.2*  --   --   --  7.7  --  6.9  --   --   --   --   --  PENDING  HGB 7.2*   < > 7.9*   < > 8.3*   < > 7.3*   < > 7.6*   < > 7.8* 8.5* 8.5* 9.5* 7.5*  HCT 22.8*   < > 24.6*   < > 25.7*   < > 23.1*   < > 23.8*   < > 23.0* 25.0* 25.0* 28.0* 24.2*  MCV 97.4  --  94.3  --  95.9  --  97.1  --  97.9  --   --   --   --   --  99.2  PLT 187  --  131*  --  128*  --  100*  --  114*  --   --   --   --   --  119*   < > = values in this interval not displayed.   Basic Metabolic Panel: Recent Labs  Lab 08/08/19 0600  08/09/19 0502  08/10/19 0500  08/10/19 1540  08/11/19 0500  08/11/19 1550  08/12/19 0154 08/12/19 0202 08/12/19 0424 08/12/19 0428 08/12/19 0432 08/12/19 0500  NA 144   141   < > 144   < > 141   143   < > 139   < > 139   139   < > 140   < > 139 139 138 140 141 140   140  K 4.8   4.7   < > 5.0   < > 4.9   5.0   < > 4.2   < > 4.5   4.5   < > 4.4   < > 4.1 4.0 3.9 3.6 4.0 4.0   4.0  CL 107   105   < > 109   < > 103   104   < > 97*   < > 93*   93*   < > 92*   < > 92* 93* 96*  --  93* 95*   94*  CO2 25   26   < > 22   < > 25   25  --  30  --  33*   34*  --  35*  --   --   --   --    --   --  33*   32  GLUCOSE 191*   197*   < > 172*   < > 188*   187*   < > 173*   < > 130*   129*   < > 190*   < > 186* 220* 194*  --  228* 215*   216*  BUN 113*   88*   < > 83*   < > 94*   93*   < > 74*   < > 66*   66*   < > 53*   < > 40* 32* 37*  --  31* 45*  46*  CREATININE 3.13*   3.36*   < > 2.67*   < > 2.81*   2.82*   < > 2.31*   < > 2.49*   2.50*   < > 2.20*   < > 1.90* 1.50* 1.80*  --  1.30* 1.89*   1.91*  CALCIUM 7.7*   7.5*   < > 7.7*   < > 8.5*   8.6*  --  8.8*  --  9.3   9.3  --  9.0  --   --   --   --   --   --  7.3*   7.3*  MG 2.9*  --  2.8*  --  2.5*  --   --   --  2.3  --   --   --   --   --   --   --   --  2.1  PHOS 3.1   3.1   < > 4.4   < > 6.1*   6.2*  --  5.6*  --  5.0*   4.8*  --  3.3  --   --   --   --   --   --  2.6   < > = values in this interval not displayed.   GFR: Estimated Creatinine Clearance: 43.2 mL/min (A) (by C-G formula based on SCr of 1.91 mg/dL (H)). Liver Function Tests: Recent Labs  Lab 08/08/19 0600  08/09/19 0502  08/10/19 0500 08/10/19 1540 08/11/19 0500 08/11/19 1550 08/12/19 0500  AST 45*  --  39  --  34  --  37  --  32  ALT 5  --  5  --  9  --  10  --  9  ALKPHOS 93  --  91  --  61  --  65  --  63  BILITOT 0.5  --  0.5  --  0.3  --  0.4  --  0.5  PROT 5.7*  --  5.7*  --  5.6*  --  5.9*  --  5.9*  ALBUMIN 2.6*   2.8*   < > 2.4*   < > 2.2*   2.2* 2.3* 2.2*   2.3* 2.2* 2.1*   2.1*   < > = values in this interval not displayed.   No results for input(s): LIPASE, AMYLASE in the last 168 hours. No results for input(s): AMMONIA in the last 168 hours. Coagulation Profile: No results for input(s): INR, PROTIME in the last 168 hours. Cardiac Enzymes: No results for input(s): CKTOTAL, CKMB, CKMBINDEX, TROPONINI in the last 168 hours. BNP (last 3 results) No results for input(s): PROBNP in the last 8760 hours. HbA1C: No results for input(s): HGBA1C in the last 72 hours. CBG: Recent Labs  Lab 08/11/19 0747 08/11/19 1142 08/11/19 1550  08/11/19 1959 08/12/19 0025  GLUCAP 157* 169* 181* 184* 188*   Lipid Profile: No results for input(s): CHOL, HDL, LDLCALC, TRIG, CHOLHDL, LDLDIRECT in the last 72 hours. Thyroid Function Tests: No results for input(s): TSH, T4TOTAL, FREET4, T3FREE, THYROIDAB in the last 72 hours. Anemia Panel: No results for input(s): VITAMINB12, FOLATE, FERRITIN, TIBC, IRON, RETICCTPCT in the last 72 hours. Urine analysis:    Component Value Date/Time   COLORURINE YELLOW 08/01/2019 1745   APPEARANCEUR HAZY (A) 08/01/2019 1745   LABSPEC 1.023 08/01/2019 1745   PHURINE 5.0 08/01/2019 [place] 08/01/2019 1745   HGBUR NEGATIVE 08/01/2019 1745   BILIRUBINUR NEGATIVE 08/01/2019 1745  KETONESUR NEGATIVE 08/01/2019 1745   PROTEINUR NEGATIVE 08/01/2019 1745   NITRITE NEGATIVE 08/01/2019 1745   LEUKOCYTESUR NEGATIVE 08/01/2019 1745   Sepsis Labs: @LABRCNTIP (procalcitonin:4,lacticidven:4)  ) Recent Results (from the past 240 hour(s))  MRSA PCR Screening     Status: None   Collection Time: 08/02/19 10:02 AM   Specimen: Nasal Mucosa; Nasopharyngeal  Result Value Ref Range Status   MRSA by PCR NEGATIVE NEGATIVE Final    Comment:        The GeneXpert MRSA Assay (FDA approved for NASAL specimens only), is one component of a comprehensive MRSA colonization surveillance program. It is not intended to diagnose MRSA infection nor to guide or monitor treatment for MRSA infections. Performed at [facility], [place] [address], [place], [place] [address]   Culture, blood (routine x 2)     Status: None   Collection Time: 08/05/19  5:00 AM   Specimen: BLOOD RIGHT HAND  Result Value Ref Range Status   Specimen Description   Final    BLOOD RIGHT HAND Performed at [facility] [address], [place], [place] [address]    Special Requests   Final    BOTTLES DRAWN AEROBIC ONLY Blood Culture adequate volume Performed at [facility] [address], [place], [place] [address]    Culture   Final    NO GROWTH 5 DAYS Performed at [facility], [place] [address] [place], [place] [address]    Report Status 08/10/2019 FINAL  Final  Culture, blood (routine x 2)     Status: None   Collection Time: 08/05/19  5:05 AM   Specimen: BLOOD LEFT HAND  Result Value Ref Range Status   Specimen Description   Final    BLOOD LEFT HAND Performed at [facility] [address], [place], [place] [address]    Special Requests   Final    BOTTLES DRAWN AEROBIC ONLY Blood Culture adequate volume Performed at [facility] [address], [place], [place] [address]    Culture   Final    NO GROWTH 5 DAYS Performed at [facility], [place] [address] [place], [place] [address]    Report Status 08/10/2019 FINAL  Final         Radiology Studies: Dg Chest Port 1 View  Result Date: 08/12/2019 CLINICAL DATA:  Respiratory failure. EXAM: PORTABLE CHEST 1 VIEW COMPARISON:  Chest x-ray 08/10/2019. FINDINGS: Endotracheal tube, dual-lumen left IJ line, right PICC line in stable position. Right lower lobe bronchus balloon occlusion catheter stable position. Right chest tube in stable position. Small right base pneumothorax cannot be excluded on today's exam. Bibasilar atelectasis/infiltrates again noted. No interim change. Tiny bilateral pleural effusions. IMPRESSION: 1. Endotracheal tube, dual-lumen left IJ line, right PICC line stable position. Right lower lobe bronchus balloon occlusion catheter in stable position. Right chest tube in stable position. 2. Small right base pneumothorax cannot be excluded on today's exam. 3. Bibasilar atelectasis/infiltrates again noted without interim change. Critical Value/emergent results were called by telephone at the time of interpretation on 08/12/2019 at 7:31 am to the patient's nurse, who verbally acknowledged these results. Electronically Signed   By: Marcello Moores   Register   On: 08/12/2019 07:33   Dg Chest Port 1 View  Result Date: 08/10/2019 CLINICAL DATA:  COVID positive EXAM: PORTABLE CHEST 1 VIEW COMPARISON:  08/09/2019 FINDINGS: Left lung apex and lateral costophrenic angle are partially visualized. Endotracheal tube terminates 5 cm above  the carina. Right chest tube.  No pneumothorax is seen. Left IJ dual lumen catheter terminates in the mid SVC. Right subclavian venous catheter terminates at the cavoatrial junction. Enteric tube terminates in the proximal stomach. Stable selective balloon occlusion catheter in the right lower lobe bronchus. Multifocal patchy opacities, lower lung predominant, grossly unchanged. No definite pleural effusions. The heart is normal in size. IMPRESSION: Multifocal patchy opacities, grossly unchanged, compatible with pneumonia in this patient with known COVID. Stable right chest tube.  No pneumothorax is seen. Endotracheal tube terminates 5 cm above the carina. Additional stable support apparatus as above. Electronically Signed   By: Julian Hy M.D.   On: 08/10/2019 16:59        Scheduled Meds:  artificial tears  1 application Both Eyes S8O   chlorhexidine  15 mL Mouth/Throat BID   Chlorhexidine Gluconate Cloth  6 each Topical Daily   feeding supplement (PRO-STAT SUGAR FREE 64)  60 mL Per Tube TID   free water  125 mL Per Tube Q6H   heparin injection (subcutaneous)  5,000 Units Subcutaneous Q8H   hydrocortisone sodium succinate  50 mg Intravenous Q8H   insulin aspart  0-9 Units Subcutaneous Q4H   magnesium citrate  0.5 Bottle Per Tube Once   mouth rinse  15 mL Mouth Rinse 10 times per day   oxyCODONE  5 mg Oral Q6H   pantoprazole (PROTONIX) IV  40 mg Intravenous Q12H   polyethylene glycol  17 g Per Tube BID   QUEtiapine  25 mg Oral BID   sennosides  5 mL Per Tube BID   sucralfate  1 g Oral Q6H   tamsulosin  0.4 mg Oral BID   Continuous Infusions:   prismasol BGK 4/2.5 500 mL/hr at  08/11/19 1950   sodium chloride 10 mL/hr at 08/09/19 1000   amiodarone 30 mg/hr (08/12/19 0624)   calcium gluconate infusion for CRRT 70 mL/hr at 08/12/19 0216   [START ON 08/13/2019]  ceFAZolin (ANCEF) IV     ceFEPime (MAXIPIME) IV Stopped (08/12/19 0216)   cisatracurium (NIMBEX) infusion 2.5 mcg/kg/min (08/12/19 0400)   feeding supplement (PIVOT 1.5 CAL) 1,000 mL (08/12/19 0400)   fentaNYL infusion INTRAVENOUS 150 mcg/hr (08/12/19 0400)   midazolam 6 mg/hr (08/12/19 0430)   norepinephrine (LEVOPHED) Adult infusion 3 mcg/min (08/12/19 0712)   prismasol B22GK 4/0 2,000 mL/hr at 08/12/19 0657   sodium citrate 2 %/dextrose 2.5% solution 3000 mL 420 mL/hr at 08/12/19 0438   vasopressin (PITRESSIN) infusion - *FOR SHOCK* Stopped (08/10/19 0906)     LOS: 18 days   The patient is critically ill with multiple organ systems failure and requires high complexity decision making for assessment and support, frequent evaluation and titration of therapies, application of advanced monitoring technologies and extensive interpretation of multiple databases. Critical Care Time devoted to patient care services described in this note  Time spent: 40 minutes     Willella Harding, Geraldo Docker, MD [facility] Pager [phone]  If 7PM-7AM, please contact night-coverage [email or web address] Password [facility] 08/12/2019, 7:36 AM

## 2019-08-12 NOTE — Progress Notes (Signed)
Endobronchial blocker secured 57.5 .

## 2019-08-12 NOTE — Progress Notes (Signed)
TF held at 2100 for GRV of 550. 500cc returned to pt and 50cc discarded. Rechecked at 0000,  [place] still 500cc.  Pt abd distended but soft, BS hypoactive x4. Dr. Shanon Brow notified, will continue to hold TF and recheck at 0400 unless otherwise ordered.

## 2019-08-13 ENCOUNTER — Inpatient Hospital Stay (HOSPITAL_COMMUNITY): Payer: Medicare HMO

## 2019-08-13 DIAGNOSIS — R579 Shock, unspecified: Secondary | ICD-10-CM | POA: Diagnosis not present

## 2019-08-13 DIAGNOSIS — D649 Anemia, unspecified: Secondary | ICD-10-CM | POA: Diagnosis not present

## 2019-08-13 DIAGNOSIS — N179 Acute kidney failure, unspecified: Secondary | ICD-10-CM | POA: Diagnosis not present

## 2019-08-13 DIAGNOSIS — E872 Acidosis: Secondary | ICD-10-CM | POA: Diagnosis not present

## 2019-08-13 DIAGNOSIS — J9600 Acute respiratory failure, unspecified whether with hypoxia or hypercapnia: Secondary | ICD-10-CM | POA: Diagnosis not present

## 2019-08-13 DIAGNOSIS — A4901 Methicillin susceptible Staphylococcus aureus infection, unspecified site: Secondary | ICD-10-CM | POA: Diagnosis not present

## 2019-08-13 DIAGNOSIS — J90 Pleural effusion, not elsewhere classified: Secondary | ICD-10-CM | POA: Diagnosis not present

## 2019-08-13 DIAGNOSIS — J181 Lobar pneumonia, unspecified organism: Secondary | ICD-10-CM | POA: Diagnosis not present

## 2019-08-13 DIAGNOSIS — J939 Pneumothorax, unspecified: Secondary | ICD-10-CM | POA: Diagnosis not present

## 2019-08-13 DIAGNOSIS — Z4682 Encounter for fitting and adjustment of non-vascular catheter: Secondary | ICD-10-CM | POA: Diagnosis not present

## 2019-08-13 DIAGNOSIS — U071 COVID-19: Secondary | ICD-10-CM | POA: Diagnosis not present

## 2019-08-13 DIAGNOSIS — I959 Hypotension, unspecified: Secondary | ICD-10-CM | POA: Diagnosis not present

## 2019-08-13 DIAGNOSIS — J9601 Acute respiratory failure with hypoxia: Secondary | ICD-10-CM | POA: Diagnosis not present

## 2019-08-13 DIAGNOSIS — J948 Other specified pleural conditions: Secondary | ICD-10-CM | POA: Diagnosis not present

## 2019-08-13 DIAGNOSIS — J9690 Respiratory failure, unspecified, unspecified whether with hypoxia or hypercapnia: Secondary | ICD-10-CM | POA: Diagnosis not present

## 2019-08-13 DIAGNOSIS — J1289 Other viral pneumonia: Secondary | ICD-10-CM | POA: Diagnosis not present

## 2019-08-13 LAB — POCT I-STAT EG7
Acid-base deficit: 3 mmol/L — ABNORMAL HIGH (ref 0.0–2.0)
Acid-base deficit: 2 mmol/L (ref 0.0–2.0)
Acid-base deficit: 3 mmol/L — ABNORMAL HIGH (ref 0.0–2.0)
Bicarbonate: 24.5 mmol/L (ref 20.0–28.0)
Bicarbonate: 25.4 mmol/L (ref 20.0–28.0)
Bicarbonate: 25.2 mmol/L (ref 20.0–28.0)
Calcium, Ion: 0.5 mmol/L — CL (ref 1.15–1.40)
Calcium, Ion: 0.56 mmol/L — CL (ref 1.15–1.40)
Calcium, Ion: 0.59 mmol/L — CL (ref 1.15–1.40)
HCT: 26 % — ABNORMAL LOW (ref 39.0–52.0)
HCT: 27 % — ABNORMAL LOW (ref 39.0–52.0)
HCT: 29 % — ABNORMAL LOW (ref 39.0–52.0)
Hemoglobin: 8.8 g/dL — ABNORMAL LOW (ref 13.0–17.0)
Hemoglobin: 9.2 g/dL — ABNORMAL LOW (ref 13.0–17.0)
Hemoglobin: 9.9 g/dL — ABNORMAL LOW (ref 13.0–17.0)
O2 Saturation: 69 %
O2 Saturation: 72 %
O2 Saturation: 70 %
Patient temperature: 37
Patient temperature: 36.7
Patient temperature: 36.4
Potassium: 4.6 mmol/L (ref 3.5–5.1)
Potassium: 4.5 mmol/L (ref 3.5–5.1)
Potassium: 4.4 mmol/L (ref 3.5–5.1)
Sodium: 138 mmol/L (ref 135–145)
Sodium: 138 mmol/L (ref 135–145)
Sodium: 137 mmol/L (ref 135–145)
TCO2: 26 mmol/L (ref 22–32)
TCO2: 27 mmol/L (ref 22–32)
TCO2: 27 mmol/L (ref 22–32)
pCO2, Ven: 58.5 mmHg (ref 44.0–60.0)
pCO2, Ven: 55.8 mmHg (ref 44.0–60.0)
pCO2, Ven: 57.9 mmHg (ref 44.0–60.0)
pH, Ven: 7.23 — ABNORMAL LOW (ref 7.250–7.430)
pH, Ven: 7.264 (ref 7.250–7.430)
pH, Ven: 7.243 — ABNORMAL LOW (ref 7.250–7.430)
pO2, Ven: 43 mmHg (ref 32.0–45.0)
pO2, Ven: 44 mmHg (ref 32.0–45.0)
pO2, Ven: 42 mmHg (ref 32.0–45.0)

## 2019-08-13 LAB — CBC WITH DIFFERENTIAL/PLATELET
Abs Immature Granulocytes: 0.52 10*3/uL — ABNORMAL HIGH (ref 0.00–0.07)
Basophils Absolute: 0 10*3/uL (ref 0.0–0.1)
Basophils Relative: 0 %
Eosinophils Absolute: 0.1 10*3/uL (ref 0.0–0.5)
Eosinophils Relative: 1 %
HCT: 21.7 % — ABNORMAL LOW (ref 39.0–52.0)
Hemoglobin: 6.6 g/dL — CL (ref 13.0–17.0)
Immature Granulocytes: 6 %
Lymphocytes Relative: 4 %
Lymphs Abs: 0.4 10*3/uL — ABNORMAL LOW (ref 0.7–4.0)
MCH: 30.8 pg (ref 26.0–34.0)
MCHC: 30.4 g/dL (ref 30.0–36.0)
MCV: 101.4 fL — ABNORMAL HIGH (ref 80.0–100.0)
Monocytes Absolute: 0.3 10*3/uL (ref 0.1–1.0)
Monocytes Relative: 3 %
Neutro Abs: 8 10*3/uL — ABNORMAL HIGH (ref 1.7–7.7)
Neutrophils Relative %: 86 %
Platelets: 118 10*3/uL — ABNORMAL LOW (ref 150–400)
RBC: 2.14 MIL/uL — ABNORMAL LOW (ref 4.22–5.81)
RDW: 17.1 % — ABNORMAL HIGH (ref 11.5–15.5)
WBC: 9.2 10*3/uL (ref 4.0–10.5)
nRBC: 1.6 % — ABNORMAL HIGH (ref 0.0–0.2)

## 2019-08-13 LAB — RENAL FUNCTION PANEL
Albumin: 2.8 g/dL — ABNORMAL LOW (ref 3.5–5.0)
Albumin: 2.6 g/dL — ABNORMAL LOW (ref 3.5–5.0)
Anion gap: 11 (ref 5–15)
Anion gap: 12 (ref 5–15)
BUN: 44 mg/dL — ABNORMAL HIGH (ref 8–23)
BUN: 48 mg/dL — ABNORMAL HIGH (ref 8–23)
CO2: 27 mmol/L (ref 22–32)
CO2: 24 mmol/L (ref 22–32)
Calcium: 7.9 mg/dL — ABNORMAL LOW (ref 8.9–10.3)
Calcium: 8 mg/dL — ABNORMAL LOW (ref 8.9–10.3)
Chloride: 97 mmol/L — ABNORMAL LOW (ref 98–111)
Chloride: 101 mmol/L (ref 98–111)
Creatinine, Ser: 1.86 mg/dL — ABNORMAL HIGH (ref 0.61–1.24)
Creatinine, Ser: 2.48 mg/dL — ABNORMAL HIGH (ref 0.61–1.24)
GFR calc Af Amer: 43 mL/min — ABNORMAL LOW (ref >60)
GFR calc Af Amer: 30 mL/min — ABNORMAL LOW (ref >60)
GFR calc non Af Amer: 37 mL/min — ABNORMAL LOW (ref >60)
GFR calc non Af Amer: 26 mL/min — ABNORMAL LOW (ref >60)
Glucose, Bld: 113 mg/dL — ABNORMAL HIGH (ref 70–99)
Glucose, Bld: 119 mg/dL — ABNORMAL HIGH (ref 70–99)
Phosphorus: 3.4 mg/dL (ref 2.5–4.6)
Phosphorus: 6.5 mg/dL — ABNORMAL HIGH (ref 2.5–4.6)
Potassium: 4.5 mmol/L (ref 3.5–5.1)
Potassium: 5.1 mmol/L (ref 3.5–5.1)
Sodium: 135 mmol/L (ref 135–145)
Sodium: 137 mmol/L (ref 135–145)

## 2019-08-13 LAB — COMPREHENSIVE METABOLIC PANEL
ALT: 9 U/L (ref 0–44)
AST: 24 U/L (ref 15–41)
Albumin: 2.8 g/dL — ABNORMAL LOW (ref 3.5–5.0)
Alkaline Phosphatase: 52 U/L (ref 38–126)
Anion gap: 11 (ref 5–15)
BUN: 43 mg/dL — ABNORMAL HIGH (ref 8–23)
CO2: 27 mmol/L (ref 22–32)
Calcium: 7.9 mg/dL — ABNORMAL LOW (ref 8.9–10.3)
Chloride: 96 mmol/L — ABNORMAL LOW (ref 98–111)
Creatinine, Ser: 1.79 mg/dL — ABNORMAL HIGH (ref 0.61–1.24)
GFR calc Af Amer: 45 mL/min — ABNORMAL LOW (ref >60)
GFR calc non Af Amer: 39 mL/min — ABNORMAL LOW (ref >60)
Glucose, Bld: 112 mg/dL — ABNORMAL HIGH (ref 70–99)
Potassium: 4.5 mmol/L (ref 3.5–5.1)
Sodium: 134 mmol/L — ABNORMAL LOW (ref 135–145)
Total Bilirubin: 0.5 mg/dL (ref 0.3–1.2)
Total Protein: 6.2 g/dL — ABNORMAL LOW (ref 6.5–8.1)

## 2019-08-13 LAB — C-REACTIVE PROTEIN: CRP: 9.8 mg/dL — ABNORMAL HIGH (ref <1.0)

## 2019-08-13 LAB — POCT I-STAT 7, (LYTES, BLD GAS, ICA,H+H)
Acid-Base Excess: 3 mmol/L — ABNORMAL HIGH (ref 0.0–2.0)
Bicarbonate: 28.9 mmol/L — ABNORMAL HIGH (ref 20.0–28.0)
Calcium, Ion: 1.14 mmol/L — ABNORMAL LOW (ref 1.15–1.40)
HCT: 30 % — ABNORMAL LOW (ref 39.0–52.0)
Hemoglobin: 10.2 g/dL — ABNORMAL LOW (ref 13.0–17.0)
O2 Saturation: 94 %
Patient temperature: 35.1
Potassium: 4.5 mmol/L (ref 3.5–5.1)
Sodium: 136 mmol/L (ref 135–145)
TCO2: 30 mmol/L (ref 22–32)
pCO2 arterial: 43.9 mmHg (ref 32.0–48.0)
pH, Arterial: 7.418 (ref 7.350–7.450)
pO2, Arterial: 65 mmHg — ABNORMAL LOW (ref 83.0–108.0)

## 2019-08-13 LAB — MAGNESIUM: Magnesium: 2.4 mg/dL (ref 1.7–2.4)

## 2019-08-13 LAB — HEPARIN LEVEL (UNFRACTIONATED)
Heparin Unfractionated: 0.42 IU/mL (ref 0.30–0.70)
Heparin Unfractionated: 0.43 IU/mL (ref 0.30–0.70)

## 2019-08-13 LAB — CBC
HCT: 22.1 % — ABNORMAL LOW (ref 39.0–52.0)
Hemoglobin: 6.8 g/dL — CL (ref 13.0–17.0)
MCH: 31.3 pg (ref 26.0–34.0)
MCHC: 30.8 g/dL (ref 30.0–36.0)
MCV: 101.8 fL — ABNORMAL HIGH (ref 80.0–100.0)
Platelets: 122 10*3/uL — ABNORMAL LOW (ref 150–400)
RBC: 2.17 MIL/uL — ABNORMAL LOW (ref 4.22–5.81)
RDW: 17 % — ABNORMAL HIGH (ref 11.5–15.5)
WBC: 11.9 10*3/uL — ABNORMAL HIGH (ref 4.0–10.5)
nRBC: 1.7 % — ABNORMAL HIGH (ref 0.0–0.2)

## 2019-08-13 LAB — GLUCOSE, CAPILLARY
Glucose-Capillary: 106 mg/dL — ABNORMAL HIGH (ref 70–99)
Glucose-Capillary: 112 mg/dL — ABNORMAL HIGH (ref 70–99)
Glucose-Capillary: 115 mg/dL — ABNORMAL HIGH (ref 70–99)
Glucose-Capillary: 168 mg/dL — ABNORMAL HIGH (ref 70–99)

## 2019-08-13 LAB — PREPARE RBC (CROSSMATCH)

## 2019-08-13 LAB — D-DIMER, QUANTITATIVE: D-Dimer, Quant: 5.27 ug/mL-FEU — ABNORMAL HIGH (ref 0.00–0.50)

## 2019-08-13 LAB — OCCULT BLOOD X 1 CARD TO LAB, STOOL: Fecal Occult Bld: POSITIVE — AB

## 2019-08-13 MED ORDER — SODIUM CHLORIDE 0.9 % IV BOLUS
500.0000 mL | Freq: Once | INTRAVENOUS | Status: AC
  Administered 2019-08-13: 11:00:00 500 mL via INTRAVENOUS

## 2019-08-13 MED ORDER — FENTANYL CITRATE (PF) 100 MCG/2ML IJ SOLN: 25.0000 ug | Freq: Once | INTRAMUSCULAR | Status: DC

## 2019-08-13 MED ORDER — MIDAZOLAM HCL 2 MG/2ML IJ SOLN: 1.0000 mg | INTRAMUSCULAR | Status: DC | PRN

## 2019-08-13 MED ORDER — DEXTROSE 5 % IV SOLN
Status: DC
  Administered 2019-08-13 – 2019-08-19 (×17): via INTRAVENOUS_CENTRAL
  Filled 2019-08-13 (×19): qty 1500

## 2019-08-13 MED ORDER — PRISMASOL B22GK 4/0 22-4 MEQ/L IV SOLN
INTRAVENOUS | Status: DC
  Administered 2019-08-13 – 2019-08-18 (×36): via INTRAVENOUS_CENTRAL

## 2019-08-13 MED ORDER — PIVOT 1.5 CAL PO LIQD
1000.0000 mL | ORAL | Status: DC
  Administered 2019-08-14 – 2019-08-19 (×9): 1000 mL

## 2019-08-13 MED ORDER — FENTANYL 2500MCG IN NS 250ML (10MCG/ML) PREMIX INFUSION
25.0000 ug/h | INTRAVENOUS | Status: DC
  Administered 2019-08-14: 175 ug/h via INTRAVENOUS
  Filled 2019-08-13: qty 250

## 2019-08-13 MED ORDER — MIDAZOLAM 50MG/50ML (1MG/ML) PREMIX INFUSION
0.0000 mg/h | INTRAVENOUS | Status: DC
  Administered 2019-08-13: 23:00:00 7 mg/h via INTRAVENOUS
  Filled 2019-08-13: qty 50

## 2019-08-13 MED ORDER — FENTANYL BOLUS VIA INFUSION
25.0000 ug | INTRAVENOUS | Status: DC | PRN
  Administered 2019-08-14 – 2019-08-15 (×5): 25 ug via INTRAVENOUS
  Filled 2019-08-13: qty 25

## 2019-08-13 MED ORDER — SODIUM CHLORIDE 0.9 % IV SOLN
200.0000 mg | Freq: Once | INTRAVENOUS | Status: AC
  Administered 2019-08-13: 200 mg via INTRAVENOUS
  Filled 2019-08-13: qty 200

## 2019-08-13 MED ORDER — MIDAZOLAM BOLUS VIA INFUSION
1.0000 mg | INTRAVENOUS | Status: DC | PRN
  Filled 2019-08-13: qty 2

## 2019-08-13 MED ORDER — DEXTROSE 5 % IV SOLN
20.0000 g | INTRAVENOUS | Status: DC
  Administered 2019-08-13 – 2019-08-18 (×8): 20 g via INTRAVENOUS_CENTRAL
  Filled 2019-08-13 (×10): qty 200

## 2019-08-13 MED ORDER — SODIUM CHLORIDE 0.9% IV SOLUTION
Freq: Once | INTRAVENOUS | Status: AC
  Administered 2019-08-14: 19:00:00 via INTRAVENOUS

## 2019-08-13 MED ORDER — SODIUM CHLORIDE 3 % IN NEBU
4.0000 mL | INHALATION_SOLUTION | Freq: Two times a day (BID) | RESPIRATORY_TRACT | Status: AC
  Administered 2019-08-13 – 2019-08-15 (×5): 4 mL via RESPIRATORY_TRACT
  Filled 2019-08-13 (×7): qty 4

## 2019-08-13 MED ORDER — SODIUM CHLORIDE 0.9 % IV SOLN
100.0000 mg | INTRAVENOUS | Status: DC
  Filled 2019-08-13: qty 100

## 2019-08-13 NOTE — Progress Notes (Signed)
[name]  BJS:[id number] DOB: 02-16-1951 DOA: 07/25/2019 PCP: Kathyrn Drown, MD    Brief Narrative:  68 year old with a history of GERD and BPH who presented to [facility], ED with S OB and was found to be hypoxic.  He was initially diagnosed with COVID-19 September 20.  1 to 2 days prior to his presentation he developed worsening shortness of breath with anorexia.  His wife's physician during a teleconference call noted that the patient himself is very tachypneic and ultimately EMS was sent to the house.  EMS found the patient to have saturations in the 40s.  In the ED on nonrebreather the patient sats only improved to the 80s.  The patient was severely confused and required emergent intubation.  Chest x-ray confirmed multifocal infiltrates.  CT chest was negative for PE  Significant Events: 10/3 admit to [facility] from [place] ED 10/13 right pneumothorax -pigtail chest tube placed 10/14 Nephrology consulted 10/15 begin CRRT -endobronchial blocker placed 10/16 melena 10/17 A. fib with RVR -EGD 10/18 transfuse PRBC 10/20 Heparin drip resumed 10/21 abrupt drop in hemoglobin -transfuse 1 unit PRBC  COVID-19 specific Treatment: Decadron 10/2 > 10/12 Remdesivir 10/2 > 10/6 Actemra 10/2 Convalescent plasma 10/3  Subjective: Considerable difficulties being encountered with thick sediment in the patient's chest tube which appears to be blocking air flow.  Advnaced level ventilator support.  He is sedated.  He remains critically ill.  CRRT continues.  With a significant hemoglobin drop this morning we have had to stop his anticoagulation.  Assessment & Plan:  Covid pneumonia - acute hypoxic respiratory failure Has completed a course of decadron and remdesivir - was dosed with actemra and convalescent plasma - pulmonary status remains very complicated and tenuous - ventilator management per PCCM  Recent Labs  Lab 08/09/19 0502 08/10/19 0500 08/11/19 0500 08/12/19 0500 08/13/19 0500   DDIMER 4.64* 12.24* >20.00* 11.03* 5.27*  CRP 5.4* 4.8* 4.2* 5.5* 9.8*  ALT 5 9 10 9 9     GI bleed - gastric ulcer - acute blood loss anemia Status post EGD with clipping of vessel in large ulcer - full anticoagulation was resumed 10/20 following which he has experienced an abrupt drop in his hemoglobin again - he will be transfused 1 unit today - we will stop all anticoagulation at this time  Markedly elevated D-dimer signif increase noted 10/19 - unable to anticoag at present due to GIB - trending back down now - no evidence of DVT on dopplers 10/8   Pneumothorax right Follow-up chest x-ray reviewed this morning -CT chest pending -bedside bronchoscopy revealed significant material in the airway  Acute transient atrial fibrillation with RVR No known prior history of atrial fibrillation -currently in NSR -unable to anticoagulate due to recurrent GI bleeding -follow on telemetry  MSSA bacteremia - septic shock - hypotension  Acute renal failure CRRT dependent -Nephrology following  BPH  DVT prophylaxis: Heparin drip Code Status: FULL CODE Family Communication:  Disposition Plan:   Consultants:  PCCM Nephrology GI  Antimicrobials:  10/9 vancomycin > 10/10 10/9 cefepime > 10/10 10/10 ancef > 10/13 10/13 cefepime > 10/20 10/14 anidulofungin > 10/15 Cefazolin 10/20 >  Objective: Blood pressure (!) 105/58, pulse 83, temperature 98.6 F (37 C), temperature source Core, resp. rate 17, height 5\' 10"  (1.778 m), weight 90.8 kg, SpO2 100 %.  Intake/Output Summary (Last 24 hours) at 08/13/2019 1423 Last data filed at 08/13/2019 1200 Gross per 24 hour  Intake 2833.07 ml  Output 3427 ml  Net -593.93 ml   Filed Weights   08/11/19 0415 08/12/19 0500 08/13/19 0500  Weight: 93.6 kg 91.2 kg 90.8 kg    Examination: General: No acute respiratory distress Lungs: Fine crackles throughout all fields with no wheezing Cardiovascular: Regular rate and rhythm without murmur gallop  or rub normal S1 and S2 Abdomen: Nontender, nondistended, soft, bowel sounds positive, no rebound, no ascites, no appreciable mass Extremities: No significant cyanosis, clubbing, or edema bilateral lower extremities  CBC: Recent Labs  Lab 08/11/19 0500  08/12/19 0500  08/13/19 0451 08/13/19 0500 08/13/19 1030  WBC 8.6  --  8.3  --   --  9.2 11.9*  NEUTROABS 6.9  --  6.7  --   --  8.0*  --   HGB 7.6*   < > 7.5*   < > 10.2* 6.6* 6.8*  HCT 23.8*   < > 24.2*   < > 30.0* 21.7* 22.1*  MCV 97.9  --  99.2  --   --  101.4* 101.8*  PLT 114*  --  119*  --   --  118* 122*   < > = values in this interval not displayed.   Basic Metabolic Panel: Recent Labs  Lab 08/11/19 0500  08/12/19 0500  08/12/19 1600  08/12/19 1723 08/13/19 0451 08/13/19 0500  NA 139  139   < > 140  140   < > 139   < > 140 136 135  134*  K 4.5  4.5   < > 4.0  4.0   < > 4.6   < > 4.5 4.5 4.5  4.5  CL 93*  93*   < > 95*  94*  --  94*  --   --   --  97*  96*  CO2 33*  34*   < > 33*  32  --  32  --   --   --  27  27  GLUCOSE 130*  129*   < > 215*  216*  --  188*  --   --   --  113*  112*  BUN 66*  66*   < > 45*  46*  --  39*  --   --   --  44*  43*  CREATININE 2.49*  2.50*   < > 1.89*  1.91*  --  1.81*  --   --   --  1.86*  1.79*  CALCIUM 9.3  9.3   < > 7.3*  7.3*  --  7.5*  --   --   --  7.9*  7.9*  MG 2.3  --  2.1  --   --   --   --   --  2.4  PHOS 5.0*  4.8*   < > 2.6  --  3.6  --   --   --  3.4   < > = values in this interval not displayed.   GFR: Estimated Creatinine Clearance: 44.3 mL/min (A) (by C-G formula based on SCr of 1.86 mg/dL (H)).  Liver Function Tests: Recent Labs  Lab 08/10/19 0500  08/11/19 0500 08/11/19 1550 08/12/19 0500 08/12/19 1600 08/13/19 0500  AST 34  --  37  --  32  --  24  ALT 9  --  10  --  9  --  9  ALKPHOS 61  --  65  --  63  --  52  BILITOT 0.3  --  0.4  --  0.5  --  0.5  PROT 5.6*  --  5.9*  --  5.9*  --  6.2*  ALBUMIN 2.2*  2.2*   < > 2.2*  2.3*  2.2* 2.1*  2.1* 2.6* 2.8*  2.8*   < > = values in this interval not displayed.    HbA1C: Hgb A1c MFr Bld  Date/Time Value Ref Range Status  07/26/2019 05:00 AM 6.1 (H) 4.8 - 5.6 % Final    Comment:    (NOTE) Pre diabetes:          5.7%-6.4% Diabetes:              >6.4% Glycemic control for   <7.0% adults with diabetes     CBG: Recent Labs  Lab 08/12/19 1542 08/12/19 2000 08/13/19 0008 08/13/19 0732 08/13/19 1238  GLUCAP 171* 153* 106* 112* 115*    Recent Results (from the past 240 hour(s))  Culture, blood (routine x 2)     Status: None   Collection Time: 08/05/19  5:00 AM   Specimen: BLOOD RIGHT HAND  Result Value Ref Range Status   Specimen Description   Final    BLOOD RIGHT HAND Performed at [facility], [place] [address], [place], [place] [address]    Special Requests   Final    BOTTLES DRAWN AEROBIC ONLY Blood Culture adequate volume Performed at [place] [address] [place], [place] [address]    Culture   Final    NO GROWTH 5 DAYS Performed at [facility], [place] [address] [place], [place] [address]    Report Status 08/10/2019 FINAL  Final  Culture, blood (routine x 2)     Status: None   Collection Time: 08/05/19  5:05 AM   Specimen: BLOOD LEFT HAND  Result Value Ref Range Status   Specimen Description   Final    BLOOD LEFT HAND Performed at [place] [address] [place], [place] [address]    Special Requests   Final    BOTTLES DRAWN AEROBIC ONLY Blood Culture adequate volume Performed at [place] [address], [place], [place] [address]    Culture   Final    NO GROWTH 5 DAYS Performed at [facility], [place] [address], [place], [place] [address]    Report Status 08/10/2019 FINAL  Final     Scheduled Meds: . sodium chloride   Intravenous Once  . artificial tears  1 application Both Eyes G3O  . chlorhexidine  15 mL Mouth/Throat BID  .  Chlorhexidine Gluconate Cloth  6 each Topical Daily  . feeding supplement (PRO-STAT SUGAR FREE 64)  60 mL Per Tube TID  . hydrocortisone sodium succinate  50 mg Intravenous Q8H  . insulin aspart  0-9 Units Subcutaneous Q4H  . mouth rinse  15 mL Mouth Rinse 10 times per day  . oxyCODONE  5 mg Oral Q6H  . pantoprazole (PROTONIX) IV  40 mg Intravenous Q12H  . polyethylene glycol  17 g Per Tube BID  . QUEtiapine  25 mg Oral BID  . sennosides  5 mL Per Tube BID  . sodium chloride HYPERTONIC  4 mL Nebulization BID  . sucralfate  1 g Oral Q6H  . tamsulosin  0.4 mg Oral BID   Continuous Infusions: .  prismasol BGK 4/2.5 500 mL/hr at 08/11/19 1950  . sodium chloride 10 mL/hr at 08/09/19 1000  . amiodarone Stopped (08/13/19 1100)  . calcium gluconate infusion for CRRT    .  ceFAZolin (ANCEF) IV 2 g (08/13/19 1346)  . cisatracurium (NIMBEX) infusion 2.5 mcg/kg/min (08/13/19 0800)  . feeding supplement (PIVOT 1.5 CAL) Stopped (08/13/19 0900)  . fentaNYL infusion INTRAVENOUS 200 mcg/hr (08/13/19 1200)  . midazolam 7 mg/hr (08/13/19 0903)  . norepinephrine (LEVOPHED) Adult infusion 12 mcg/min (08/13/19 1100)  . prismasol B22GK 4/0    . sodium citrate 2 %/dextrose 2.5% solution 3000 mL       LOS: 19 days   Cherene Altes, MD [facility] Office  [phone] Pager - Text Page per Amion  If 7PM-7AM, please contact night-coverage per Amion 08/13/2019, 2:23 PM

## 2019-08-13 NOTE — Progress Notes (Signed)
GRV- 520. Will continue to hold TF and recheck at 0400.

## 2019-08-13 NOTE — Progress Notes (Signed)
[facility] ROUNDING NOTE   Subjective:   This is a 68 year old gentleman who was admitted with Covid pneumonia July 13, 2019 completed course of remdesivir convalescent plasma Decadron treated with vancomycin and cefepime from 08/01/2019 until 08/02/2019.  On 08/02/2019 patient developed an ileus.  His creatinine on 07/25/2019 was 1.5 which appears to be his baseline.  On 08/02/2019 started to increase.  On 08/02/2019 was found to have MSSA bacteremia and was started on Ancef.  His blood pressure has been marginal with drops in blood pressure to 70 mmHg.  His renal ultrasound showed no hydronephrosis on 08/07/2019. Pt had administration of intravenous contrast on 07/25/2019.  Remains on levo gtt.   Hb 6.8 +stools for FOB so systemic IV heparin is being dc'd today.   No sig UOP. Continues on CRRT with no fluid off. D#7     Objective:  Vital signs in last 24 hours:  Temp:  [96.3 F (35.7 C)-98.6 F (37 C)] 96.3 F (35.7 C) (10/21 0800) Pulse Rate:  [61-90] 66 (10/21 1000) Resp:  [0-35] 23 (10/21 1000) BP: (86-119)/(43-63) 110/63 (10/21 1000) SpO2:  [96 %-100 %] 96 % (10/21 1000) Arterial Line BP: (93-150)/(38-59) 108/38 (10/21 1000) FiO2 (%):  [45 %] 45 % (10/21 0802) Weight:  [90.8 kg] 90.8 kg (10/21 0500)  Exam:   Patient not examined directly given COVID-19 + status, utilizing exam of the primary team and observations of RN's.    Assessment/ Plan:   Acute kidney injury: on CKD w/ baseline creat of 1.5. Admitted with Covid pneumonia complicated course with recent history of MSSA bacteremia.  Developed shock hypotension and required pressors.  No evidence of hydronephrosis on renal ultrasound. Suspected ATN. There was administration of contrast but this was early on the course of his illness on 07/25/2019. He developed progressive metabolic acidosis and was started on CRRT 08/07/2019. This is D#7  Systemic IV hep to be dc'd today due to low Hb and +stool FOB,  recent GIB > will resume citrate a/c protocol  I/O 's even, wt's coming down slowly, watch for vol depletion which can happen w/ prolonged CRRT due to insensibe losses  Anemia / sp GIB - as per primary team transfuse as necessary  COVID-19 pneumonia-  Treated with remdesivir, dexamethasone and convalescent plasma  Afib / RVR - new, IV amio  GIB - acute, sp EGD 10/17 w/ large gastric ulcer/ w visible vessel, Rx'd by clipping  VDRF/ R PTX w/ chest tube  Shock - weaning pressors down, BP's low 100's on levo gtt  MSSA bacteremia-  vancomycin and anidulafungin have been discontinued.  Continues on cefepime   LOS: 15 Sandy Salaam Izzah [place] @TODAY @8 :11 PM   Basic Metabolic Panel: Recent Labs  Lab 08/09/19 0502  08/10/19 0500  08/11/19 0500  08/11/19 1550  08/12/19 0424  08/12/19 0432 08/12/19 0500  08/12/19 1600 08/12/19 1657 08/12/19 1723 08/13/19 0451 08/13/19 0500  NA 144   < > 141  143   < > 139  139   < > 140   < > 138   < > 141 140  140   < > 139 138 140 136 135  134*  K 5.0   < > 4.9  5.0   < > 4.5  4.5   < > 4.4   < > 3.9   < > 4.0 4.0  4.0   < > 4.6 4.5 4.5 4.5 4.5  4.5  CL 109   < > 103  104   < > 93*  93*   < > 92*   < > 96*  --  93* 95*  94*  --  94*  --   --   --  97*  96*  CO2 22   < > 25  25   < > 33*  34*  --  35*  --   --   --   --  33*  32  --  32  --   --   --  27  27  GLUCOSE 172*   < > 188*  187*   < > 130*  129*   < > 190*   < > 194*  --  228* 215*  216*  --  188*  --   --   --  113*  112*  BUN 83*   < > 94*  93*   < > 66*  66*   < > 53*   < > 37*  --  31* 45*  46*  --  39*  --   --   --  44*  43*  CREATININE 2.67*   < > 2.81*  2.82*   < > 2.49*  2.50*   < > 2.20*   < > 1.80*  --  1.30* 1.89*  1.91*  --  1.81*  --   --   --  1.86*  1.79*  CALCIUM 7.7*   < > 8.5*  8.6*   < > 9.3  9.3  --  9.0  --   --   --   --  7.3*  7.3*  --  7.5*  --   --   --  7.9*  7.9*  MG 2.8*  --  2.5*  --  2.3  --   --   --   --   --   --  2.1  --   --    --   --   --  2.4  PHOS 4.4   < > 6.1*  6.2*   < > 5.0*  4.8*  --  3.3  --   --   --   --  2.6  --  3.6  --   --   --  3.4   < > = values in this interval not displayed.    Liver Function Tests: Recent Labs  Lab 08/09/19 0502  08/10/19 0500  08/11/19 0500 08/11/19 1550 08/12/19 0500 08/12/19 1600 08/13/19 0500  AST 39  --  34  --  37  --  32  --  24  ALT 5  --  9  --  10  --  9  --  9  ALKPHOS 91  --  61  --  65  --  63  --  52  BILITOT 0.5  --  0.3  --  0.4  --  0.5  --  0.5  PROT 5.7*  --  5.6*  --  5.9*  --  5.9*  --  6.2*  ALBUMIN 2.4*   < > 2.2*  2.2*   < > 2.2*  2.3* 2.2* 2.1*  2.1* 2.6* 2.8*  2.8*   < > = values in this interval not displayed.   No results for input(s): LIPASE, AMYLASE in the last 168 hours. No results for input(s): AMMONIA in the last 168 hours.  CBC: Recent Labs  Lab 08/09/19 0502  08/10/19 0500  08/11/19 0500  08/12/19 0500  08/12/19  1657 08/12/19 1723 08/13/19 0451 08/13/19 0500 08/13/19 1030  WBC 10.1   < > 8.7  --  8.6  --  8.3  --   --   --   --  9.2 11.9*  NEUTROABS 9.2*  --  7.7  --  6.9  --  6.7  --   --   --   --  8.0*  --   HGB 7.9*   < > 7.3*   < > 7.6*   < > 7.5*   < > 8.2* 8.5* 10.2* 6.6* 6.8*  HCT 24.6*   < > 23.1*   < > 23.8*   < > 24.2*   < > 24.0* 25.0* 30.0* 21.7* 22.1*  MCV 94.3   < > 97.1  --  97.9  --  99.2  --   --   --   --  101.4* 101.8*  PLT 131*   < > 100*  --  114*  --  119*  --   --   --   --  118* 122*   < > = values in this interval not displayed.    Cardiac Enzymes: No results for input(s): CKTOTAL, CKMB, CKMBINDEX, TROPONINI in the last 168 hours.  BNP: Invalid input(s): POCBNP  CBG: Recent Labs  Lab 08/12/19 1139 08/12/19 1542 08/12/19 2000 08/13/19 0008 08/13/19 0732  GLUCAP 199* 171* 153* 106* 112*     Medications:   .  prismasol BGK 4/2.5 500 mL/hr at 08/11/19 1950  . sodium chloride 10 mL/hr at 08/09/19 1000  . amiodarone 30 mg/hr (08/13/19 0800)  . calcium gluconate infusion for  CRRT    .  ceFAZolin (ANCEF) IV 2 g (08/13/19 0207)  . cisatracurium (NIMBEX) infusion 2.5 mcg/kg/min (08/13/19 0800)  . feeding supplement (PIVOT 1.5 CAL) 1,000 mL (08/13/19 0400)  . fentaNYL infusion INTRAVENOUS 200 mcg/hr (08/13/19 0900)  . midazolam 7 mg/hr (08/13/19 0903)  . norepinephrine (LEVOPHED) Adult infusion 10 mcg/min (08/13/19 1000)  . prismasol B22GK 4/0    . sodium citrate 2 %/dextrose 2.5% solution 3000 mL     . artificial tears  1 application Both Eyes M3O  . chlorhexidine  15 mL Mouth/Throat BID  . Chlorhexidine Gluconate Cloth  6 each Topical Daily  . feeding supplement (PRO-STAT SUGAR FREE 64)  60 mL Per Tube TID  . hydrocortisone sodium succinate  50 mg Intravenous Q8H  . insulin aspart  0-9 Units Subcutaneous Q4H  . mouth rinse  15 mL Mouth Rinse 10 times per day  . oxyCODONE  5 mg Oral Q6H  . pantoprazole (PROTONIX) IV  40 mg Intravenous Q12H  . polyethylene glycol  17 g Per Tube BID  . QUEtiapine  25 mg Oral BID  . sennosides  5 mL Per Tube BID  . sucralfate  1 g Oral Q6H  . tamsulosin  0.4 mg Oral BID   Place/Maintain arterial line **AND** sodium chloride, acetaminophen (TYLENOL) oral liquid 160 mg/5 mL, acetaminophen, fentaNYL, heparin, influenza vaccine adjuvanted, midazolam, pneumococcal 23 valent vaccine, sodium chloride

## 2019-08-13 NOTE — Progress Notes (Addendum)
Nutrition Follow-up  DOCUMENTATION CODES:   Not applicable  INTERVENTION:   Increase Pivot 1.5 to 50 ml/hr (1200 ml/day) via OG tube 60 ml Prostat TID  Provides: 2400 kcal, 202 grams protein, and 910 ml free water.   NUTRITION DIAGNOSIS:   Inadequate oral intake related to inability to eat(pt sedated and ventilated) as evidenced by NPO status.  Ongoing.   GOAL:   Provide needs based on ASPEN/SCCM guidelines  Progressing.   MONITOR:   Vent status, Labs, Weight trends, TF tolerance, Skin, I & O's  REASON FOR ASSESSMENT:   Consult Enteral/tube feeding initiation and management  ASSESSMENT:   68 y/o male admitted with PNA and COVID 19  Pt discussed during ICU rounds and with RN.  Per RN residuals were being checked via OG tube and was consisently 450-500 and TF was kept at 20 ml/hr. TF held today for bronch.   10/14 CT inserted for large pneumothorax, HD cath placed for initiation of CVVHD - renal consulted   10/15 CRRT initiated, endobronchial blocker placed  10/17 GIB s/p EGD with clipping 10/21 bronch with mucus plug; Cortrak placed, xray pending for placement.   Patient is currently intubated on ventilator support MV: 11.4 L/min Temp (24hrs), Avg:98.1 F (36.7 C), Min:96.3 F (35.7 C), Max:99.7 F (37.6 C) MAP: a-line: 58-54-56  Medications reviewed and include: solu-cortef, SSI, miralax, senokot, carafate, fentanyl and versed for sedation Levophed @ 12 mcg (quad strength) Nimbex  Labs reviewed CBG: 115-112 16 F OG tube R Chest tube: 230 ml out  MAP: 54-61    TF: Pivot 1.5 @ 40 ml/hr with 60 ml Prostat TID Provides: 2040 kcal, 180 grams protein, and 728 ml free water.   Diet Order:   Diet Order            Diet NPO time specified  Diet effective now              EDUCATION NEEDS:   No education needs have been identified at this time  Skin:  Skin Assessment: Reviewed RN Assessment  Last BM:  135 ml x 24 hrs via rectal tube  Height:    Ht Readings from Last 1 Encounters:  08/07/19 5\' 10"  (1.778 m)    Weight:   Wt Readings from Last 1 Encounters:  08/13/19 90.8 kg    Ideal Body Weight:  75.45 kg  BMI:  Body mass index is 28.72 kg/m.  Estimated Nutritional Needs:   Kcal:  2270-2700  Protein:  175-219 grams  Fluid:  >1.9L/day  [place], [place], [place] Pager [phone] After Hours Pager

## 2019-08-13 NOTE — Progress Notes (Signed)
Wife updated over phone, all questions answered at this time.

## 2019-08-13 NOTE — Procedures (Signed)
Cortrak  Person Inserting Tube:  Maylon Peppers C, RD Tube Type:  Cortrak - 43 inches Tube Location:  Left nare Initial Placement:  Postpyloric Secured by: Bridle Technique Used to Measure Tube Placement:  Documented cm marking at nare/ corner of mouth Cortrak Secured At:  86 cm    Cortrak Tube Team Note:  Consult received to place a Cortrak feeding tube.   No x-ray is required. RN may begin using tube.   If the tube becomes dislodged please keep the tube and contact the Cortrak team at [email or web address] (password [email or web address]) for replacement.  If after hours and replacement cannot be delayed, place a NG tube and confirm placement with an abdominal x-ray.    [place], [place], [place] Pager [phone] After Hours Pager

## 2019-08-13 NOTE — Progress Notes (Signed)
NAME:  [name], MRN:  [id number], DOB:  [birth year], LOS: 67 ADMISSION DATE:  07/25/2019, CONSULTATION DATE:  10/3 REFERRING MD:  [name], CHIEF COMPLAINT:  Dyspnea   Brief History   68 yo male dx with COVID 07/22/19 presented to [facility] ER 07/25/19 with progressive dyspnea and hypoxia requiring intubation from COVID 19 pneumonia.  Past Medical History  GERD  Significant Hospital Events   10/03 Admit to [facility] from [facility] ER, start decadron and remdesivir, given tociluzimab; prone positioning 10/04 convalescent plasma 10/05 stop prone positioning 10/06 convalescent plasma; prone positioning again 10/09 start ABx 10/10 MSSA bacteremia, CVL d/ced; ID consulted; increase OG tube outpt 10/11 vent weaning trial started 10/13 fever, pneumothorax, pig tail chest tube placed 10/14 worsening hypotension and renal fx >> consulted nephrology; worsening PTX >> replaced chest tube; GNR in sputum >> ABx changed 10/15 start CRRT; endobronchial blocker placed for persistent air leak 10/16 endobronchial blocker repositioned, changed chest tube to water seal; melana with ABLA >> GI consulted; transfuse PRBC 10/17 persistent air leak, increased WOB; start nimbex gtt >> air leak decreased; EGD; A fib with RVR >> start amiodarone 10/18 transfuse PRBC; GI s/o 10/20 resume heparin gtt  Consults:  ID Nephrology Gastroenterology PCCM  Procedures:  10/2 ETT>  10/2 R IJ CVL > 10/10 10/13 Rt pig tail chest tube >> 10/14 10/14 R 57F chest tube >  10/14 R subclavian CVL >  10/14 L IJ HD cath >  10/14 Lt radial a line >   Significant Diagnostic Tests:  10/2 CT angiogram chest > extensive bilateral airspace disease predominantly posterior 10/8 doppler legs b/l > no DVT 10/15 renal u/s > normal 10/17 EGD > non bleeding gastric ulcer  10/19 Echo >> EF 60 to 65%, hyperdynamic LV with small LVOT gradient  Micro Data:  9/20 SARS COV 2 > POSITIVE 10/2 blood > negative 10/9 blood > MSSA 2/4 10/9 resp >  MSSA, Pseudomonas 10/13 blood >> negative  Antimicrobials:  10/3 remdesivir > 10/6 10/3 actemra 10/3 decadron > 10/12 10/4 convalescent plasma 10/6 convalescent plasma  10/9 vancomycin > 10/10 10/9 cefepime > 10/10 10/10 ancef > 10/13 10/13 cefepime > 10/20 10/14 anidulofungin > 10/15  10/20 ancef >   Interim history/subjective:  No air leak this morning ? Pneumothorax on chest imaging  Objective   Blood pressure (!) 114/43, pulse 64, temperature 97.6 F (36.4 C), temperature source Oral, resp. rate (!) 24, height 5\' 10"  (1.778 m), weight 90.8 kg, SpO2 100 %. CVP:  [3 mmHg-14 mmHg] 3 mmHg  Vent Mode: PRVC FiO2 (%):  [45 %] 45 % Set Rate:  [24 bmp] 24 bmp Vt Set:  [480 mL] 480 mL PEEP:  [8 cmH20] 8 cmH20 Plateau Pressure:  [23 cmH20-32 cmH20] 26 cmH20   Intake/Output Summary (Last 24 hours) at 08/13/2019 0835 Last data filed at 08/13/2019 0700 Gross per 24 hour  Intake 3690.35 ml  Output 4208 ml  Net -517.65 ml   Filed Weights   08/11/19 0415 08/12/19 0500 08/13/19 0500  Weight: 93.6 kg 91.2 kg 90.8 kg    Examination:  General:  In bed on vent HENT: NCAT ETT in place PULM: Crackles bases B, vent supported breathing CV: RRR, no mgr GI: BS+, soft, nontender MSK: normal bulk and tone Neuro: sedated on vent   10/21 chest x-ray images independently reviewed showing chest tube in place, endobronchial blocker in place, endotracheal tube in place, question pneumothorax right lower lobe  Resolved Hospital Problem list  Assessment & Plan:  ARDS due to COVID-19 pneumonia: improving oxygenation, still has large air leak but vent mechanics and oxygenation better Stop nimbex Wean sedation Continue mechanical ventilation per ARDS protocol Target TVol 6-8cc/kgIBW Target Plateau Pressure < 30cm H20 Target driving pressure less than 15 cm of water Target PaO2 55-65: titrate PEEP/FiO2 per protocol As long as PaO2 to FiO2 ratio is less than 1:150 position in prone  position for 16 hours a day Check CVP daily if CVL in place Target CVP less than 4, diurese as necessary Ventilator associated pneumonia prevention protocol  Pneumothorax right lower lobe, persistent air leak CT chest today Bronchoscopy today to inspect bronchial blocker Keep bronchial blocker up: 2cc saline, 57.5 cm secured Repeat ABG OK Depending on result of CT scan, may leave bronchial blocker balloon down and repeat ABG in AM  Thick secretions from chest tube: empyema?  Continue chest tube, monitor output  Acute kidney injury: CVVHD to continue  Circulatory shock: suspect sedation related Continue levophed titrated for MAP > 65  Need for sedation/mechanical ventilation Stop nimbex Change RASS target to -2  Gastric ulcer: PPI  Anemia, acute hemorrhagic related to GI bleed last week, now minimal oozing/bleeding Stop heparin infusion Transfuse 1 U PRBC now for goal Hgb > 7gm/dL  MSSA bacteremia Thick secretions from airway, ?fungal infection Cefazolin Send bronchial wash for bacterial/fungal  Atrial fib : resolved? Amiodarone> hold tele   Best practice:  Diet: tube feeding Pain/Anxiety/Delirium protocol (if indicated): as above VAP protocol (if indicated): yes DVT prophylaxis: change hep infusion to prophylactic dosing GI prophylaxis: pantoprazole Glucose control: SSI Mobility: bed rest Code Status: DNR Family Communication: I updated his wife [name] by phone on 10/21 Disposition: remain in ICU  Labs   CBC: Recent Labs  Lab 08/09/19 0502  08/09/19 1319  08/10/19 0500  08/11/19 0500  08/12/19 0500  08/12/19 1530 08/12/19 1657 08/12/19 1723 08/13/19 0451 08/13/19 0500  WBC 10.1  --  10.0  --  8.7  --  8.6  --  8.3  --   --   --   --   --  9.2  NEUTROABS 9.2*  --   --   --  7.7  --  6.9  --  6.7  --   --   --   --   --  PENDING  HGB 7.9*   < > 8.3*   < > 7.3*   < > 7.6*   < > 7.5*   < > 7.8* 8.2* 8.5* 10.2* 6.6*  HCT 24.6*   < > 25.7*   < > 23.1*    < > 23.8*   < > 24.2*   < > 23.0* 24.0* 25.0* 30.0* 21.7*  MCV 94.3  --  95.9  --  97.1  --  97.9  --  99.2  --   --   --   --   --  101.4*  PLT 131*  --  128*  --  100*  --  114*  --  119*  --   --   --   --   --  118*   < > = values in this interval not displayed.    Basic Metabolic Panel: Recent Labs  Lab 08/09/19 0502  08/10/19 0500  08/11/19 0500  08/11/19 1550  08/12/19 0424  08/12/19 0432 08/12/19 0500  08/12/19 1600 08/12/19 1657 08/12/19 1723 08/13/19 0451 08/13/19 0500  NA 144   < > 141  143   < >  139  139   < > 140   < > 138   < > 141 140  140   < > 139 138 140 136 135  134*  K 5.0   < > 4.9  5.0   < > 4.5  4.5   < > 4.4   < > 3.9   < > 4.0 4.0  4.0   < > 4.6 4.5 4.5 4.5 4.5  4.5  CL 109   < > 103  104   < > 93*  93*   < > 92*   < > 96*  --  93* 95*  94*  --  94*  --   --   --  97*  96*  CO2 22   < > 25  25   < > 33*  34*  --  35*  --   --   --   --  33*  32  --  32  --   --   --  27  27  GLUCOSE 172*   < > 188*  187*   < > 130*  129*   < > 190*   < > 194*  --  228* 215*  216*  --  188*  --   --   --  113*  112*  BUN 83*   < > 94*  93*   < > 66*  66*   < > 53*   < > 37*  --  31* 45*  46*  --  39*  --   --   --  44*  43*  CREATININE 2.67*   < > 2.81*  2.82*   < > 2.49*  2.50*   < > 2.20*   < > 1.80*  --  1.30* 1.89*  1.91*  --  1.81*  --   --   --  1.86*  1.79*  CALCIUM 7.7*   < > 8.5*  8.6*   < > 9.3  9.3  --  9.0  --   --   --   --  7.3*  7.3*  --  7.5*  --   --   --  7.9*  7.9*  MG 2.8*  --  2.5*  --  2.3  --   --   --   --   --   --  2.1  --   --   --   --   --  2.4  PHOS 4.4   < > 6.1*  6.2*   < > 5.0*  4.8*  --  3.3  --   --   --   --  2.6  --  3.6  --   --   --  3.4   < > = values in this interval not displayed.   GFR: Estimated Creatinine Clearance: 44.3 mL/min (A) (by C-G formula based on SCr of 1.86 mg/dL (H)). Recent Labs  Lab 08/10/19 0500 08/11/19 0500 08/12/19 0500 08/13/19 0500  WBC 8.7 8.6 8.3 9.2    Liver  Function Tests: Recent Labs  Lab 08/09/19 0502  08/10/19 0500  08/11/19 0500 08/11/19 1550 08/12/19 0500 08/12/19 1600 08/13/19 0500  AST 39  --  34  --  37  --  32  --  24  ALT 5  --  9  --  10  --  9  --  9  ALKPHOS 91  --  61  --  65  --  63  --  52  BILITOT 0.5  --  0.3  --  0.4  --  0.5  --  0.5  PROT 5.7*  --  5.6*  --  5.9*  --  5.9*  --  6.2*  ALBUMIN 2.4*   < > 2.2*  2.2*   < > 2.2*  2.3* 2.2* 2.1*  2.1* 2.6* 2.8*  2.8*   < > = values in this interval not displayed.   No results for input(s): LIPASE, AMYLASE in the last 168 hours. No results for input(s): AMMONIA in the last 168 hours.  ABG    Component Value Date/Time   PHART 7.418 08/13/2019 0451   PCO2ART 43.9 08/13/2019 0451   PO2ART 65.0 (L) 08/13/2019 0451   HCO3 28.9 (H) 08/13/2019 0451   TCO2 30 08/13/2019 0451   ACIDBASEDEF 1.0 08/12/2019 1257   O2SAT 94.0 08/13/2019 0451     Coagulation Profile: No results for input(s): INR, PROTIME in the last 168 hours.  Cardiac Enzymes: No results for input(s): CKTOTAL, CKMB, CKMBINDEX, TROPONINI in the last 168 hours.  HbA1C: Hgb A1c MFr Bld  Date/Time Value Ref Range Status  07/26/2019 05:00 AM 6.1 (H) 4.8 - 5.6 % Final    Comment:    (NOTE) Pre diabetes:          5.7%-6.4% Diabetes:              >6.4% Glycemic control for   <7.0% adults with diabetes     CBG: Recent Labs  Lab 08/12/19 1139 08/12/19 1542 08/12/19 2000 08/13/19 0008 08/13/19 0732  GLUCAP 199* 171* 153* 106* 112*       Critical care time: 35 minutes      Roselie Awkward, MD [place] P[facility]M Pager: [phone] Cell: [phone] If no response, call [phone]

## 2019-08-13 NOTE — Progress Notes (Signed)
[facility] PCCM  CT chest images personally reviewed: RLL consolidation, large cavity.   Will discuss treatment options with thoracic surgery.   I suspect he needs ongoing medical therapy until his hemodynamics improve  Roselie Awkward, MD [place] [facility]M Pager: [phone] Cell: [phone] If no response, call [phone]

## 2019-08-13 NOTE — Progress Notes (Signed)
[facility] PCCM  CT chest results reviewed, radiology feels this is intrapleural air, pneumothorax Difficult to know best approach Currently stable, minimal air leak Will put chest tube back to suction, repeat CXR in AM Stop nimbex Start PAD protocol RASS target -3, fenatnyl/versed  Roselie Awkward, MD [place] [facility]M Pager: [phone] Cell: [phone] If no response, call [phone]

## 2019-08-13 NOTE — Consult Note (Addendum)
   [facility] CM Inpatient Consult   08/13/2019  [name] 29-Sep-1951 [id number]   Patient screened for extreme high risk score for unplanned readmission score  In the [place] (corrected) [facility] ACO  .  Review of patient's medical record reveals patient is still on ventilator.  Follow up with inpatient [facility] team as appropriate for post hospital care need, if applicable.  Continue to follow progress and disposition to assess for post hospital care management needs.   Please place a [facility] Care Management consult as appropriate and for questions contact:   Natividad Brood, RN BSN [place] [facility] Liaison  [phone] business mobile phone Toll free office [phone]  Fax number: [phone] [email or web address]

## 2019-08-13 NOTE — Progress Notes (Signed)
ANTICOAGULATION CONSULT NOTE - Follow Up Consult  Pharmacy Consult for Heparin Indication: atrial fibrillation  No Known Allergies  Patient Measurements: Height: 5\' 10"  (177.8 cm) Weight: 201 lb 1.6 oz (91.2 kg) IBW/kg (Calculated) : 73 Heparin Dosing Weight: 90 kg  Vital Signs: Temp: 98.1 F (36.7 C) (10/20 2000) Temp Source: Esophageal (10/20 2000) BP: 111/60 (10/21 0100) Pulse Rate: 69 (10/21 0100)  Labs: Recent Labs    08/10/19 0500  08/11/19 0500  08/12/19 0432 08/12/19 0500  08/12/19 1530 08/12/19 1600 08/12/19 1657 08/12/19 1723 08/12/19 1800 08/13/19 0005  HGB 7.3*   < > 7.6*   < > 9.5* 7.5*   < > 7.8*  --  8.2* 8.5*  --   --   HCT 23.1*   < > 23.8*   < > 28.0* 24.2*   < > 23.0*  --  24.0* 25.0*  --   --   PLT 100*  --  114*  --   --  119*  --   --   --   --   --   --   --   HEPARINUNFRC  --   --   --   --   --   --   --   --   --   --   --  0.41 0.42  CREATININE 2.81*  2.82*   < > 2.49*  2.50*   < > 1.30* 1.89*  1.91*  --   --  1.81*  --   --   --   --    < > = values in this interval not displayed.    Estimated Creatinine Clearance: 45.6 mL/min (A) (by C-G formula based on SCr of 1.81 mg/dL (H)).   Medical History: Past Medical History:  Diagnosis Date  . COVID-19   . Heartburn     Assessment: 60 YOM diagnosed with COVID-19 with new onset atrial fibrillation with RVR. No anticoagulation PTA. During this admission, patient had an upper GI bleed 2/2 gastric ulcer. He is now s/p EGD with clipping with no evidence of active bleeding. SQ heparin was restarted 10/19, last dose given this AM at 0536. Patient also on CRRT using sodium citrate for circuit anticoagulation. Pharmacy asked to assist with switch from sodium citrate to IV heparin.  CBC low but stable today with Hgb 7.5 and Plts 119. No overt bleeding noted. Patient remains on sodium citrate for CRRT and filter has not clotted since 10/18. Will start heparin with no bolus and lower therapeutic goal  given recent GIB.  Today,   Heparin level is therapeutic 0.42, no bleeding or complications reported.  Goal of Therapy:  Heparin level 0.3-0.5 units/ml  Monitor platelets by anticoagulation protocol: Yes   Plan:  - Continue Heparin IV at 1300 units/hr - Daily HL and CBC - Monitor for signs of bleeding - Per Rx discussion with Nephrology today, plan to stop sodium citrate once HL therapeutic - ordered stopped this past PM    Royetta Asal, PharmD, BCPS 08/13/2019 2:00 AM

## 2019-08-13 NOTE — Progress Notes (Signed)
Advanced ET tube to 26 cm with assistance from Dr Lake Bells as he pulled Endobronchial blocker in place and was resecured at 55 cm. Tolerated well.

## 2019-08-13 NOTE — Progress Notes (Signed)
Endobronchial blocker secured at 49.5.

## 2019-08-13 NOTE — Progress Notes (Signed)
Spoke with pt wife regarding an update in her husbands health care and status. She was very appreciate for the care we continue to provide here at [facility]

## 2019-08-13 NOTE — Op Note (Signed)
PCCM Video Bronchoscopy Procedure Note  The patient was informed of the risks (including but not limited to bleeding, infection, respiratory failure, lung injury, tooth/oral injury) and benefits of the procedure and gave consent, see chart.  Indication: ARDS, pneumothorax with air leak, inspect bronchial blocker tube/balloon  Post Procedure Diagnosis: thick airway secretions  Location: [facility], ICU  Condition pre procedure: critically ill, on vent  Medications for procedure: fentanyl infusion, versed infusion, nimbex infusion  Procedure description: The bronchoscope was introduced through the endotracheal tube and passed to the bilateral lungs to the level of the subsegmental bronchi throughout the tracheobronchial tree.  Airway exam revealed dark, dry, hard secretions nearly occluding the trachea at the distal end of the ETT.  The scope was able to be passed beyond this and there were thick secretions in the airway bilaterally.  The bronchial blocker was in good position.  We deflated the balloon while watching on camera and produced a large air leak in the chest tube.  The air leak went away when we occluded the balloon again with 2cc saline.  The mucosa around the airway was normal in appearance without evidence of necrosis or inflammation.   There was a large mobile mucus plug in the left mainstem bronchus cleared with repeated suctioning and washing with saline.  A bronchial wash was performed and sent for bacterial and fungal culture.  The airway was patent at the completion of the procedure.    Procedures performed: bronchial washing  Specimens sent: bronch wash for fungus and bacterial culture  Condition post procedure: critically ill on vent  EBL: none  Complications: none  Roselie Awkward, MD [place] PCCM Pager: [phone] Cell: [phone] If no response, call [phone]

## 2019-08-13 NOTE — Progress Notes (Signed)
Endobronchial blocker secured 57.5.

## 2019-08-14 ENCOUNTER — Inpatient Hospital Stay (HOSPITAL_COMMUNITY): Payer: Medicare HMO

## 2019-08-14 DIAGNOSIS — I959 Hypotension, unspecified: Secondary | ICD-10-CM | POA: Diagnosis not present

## 2019-08-14 DIAGNOSIS — R579 Shock, unspecified: Secondary | ICD-10-CM | POA: Diagnosis not present

## 2019-08-14 DIAGNOSIS — U071 COVID-19: Secondary | ICD-10-CM | POA: Diagnosis not present

## 2019-08-14 DIAGNOSIS — R918 Other nonspecific abnormal finding of lung field: Secondary | ICD-10-CM | POA: Diagnosis not present

## 2019-08-14 DIAGNOSIS — D649 Anemia, unspecified: Secondary | ICD-10-CM | POA: Diagnosis not present

## 2019-08-14 DIAGNOSIS — E872 Acidosis: Secondary | ICD-10-CM | POA: Diagnosis not present

## 2019-08-14 DIAGNOSIS — N179 Acute kidney failure, unspecified: Secondary | ICD-10-CM | POA: Diagnosis not present

## 2019-08-14 DIAGNOSIS — J9601 Acute respiratory failure with hypoxia: Secondary | ICD-10-CM | POA: Diagnosis not present

## 2019-08-14 DIAGNOSIS — J939 Pneumothorax, unspecified: Secondary | ICD-10-CM | POA: Diagnosis not present

## 2019-08-14 DIAGNOSIS — J9600 Acute respiratory failure, unspecified whether with hypoxia or hypercapnia: Secondary | ICD-10-CM

## 2019-08-14 DIAGNOSIS — A4901 Methicillin susceptible Staphylococcus aureus infection, unspecified site: Secondary | ICD-10-CM | POA: Diagnosis not present

## 2019-08-14 DIAGNOSIS — J1289 Other viral pneumonia: Secondary | ICD-10-CM | POA: Diagnosis not present

## 2019-08-14 DIAGNOSIS — Z4682 Encounter for fitting and adjustment of non-vascular catheter: Secondary | ICD-10-CM | POA: Diagnosis not present

## 2019-08-14 LAB — POCT I-STAT 7, (LYTES, BLD GAS, ICA,H+H)
Acid-Base Excess: 1 mmol/L (ref 0.0–2.0)
Acid-Base Excess: 2 mmol/L (ref 0.0–2.0)
Acid-Base Excess: 4 mmol/L — ABNORMAL HIGH (ref 0.0–2.0)
Acid-Base Excess: 1 mmol/L (ref 0.0–2.0)
Acid-Base Excess: 7 mmol/L — ABNORMAL HIGH (ref 0.0–2.0)
Acid-Base Excess: 8 mmol/L — ABNORMAL HIGH (ref 0.0–2.0)
Acid-base deficit: 1 mmol/L (ref 0.0–2.0)
Acid-base deficit: 1 mmol/L (ref 0.0–2.0)
Acid-base deficit: 2 mmol/L (ref 0.0–2.0)
Bicarbonate: 26.2 mmol/L (ref 20.0–28.0)
Bicarbonate: 27.3 mmol/L (ref 20.0–28.0)
Bicarbonate: 26.7 mmol/L (ref 20.0–28.0)
Bicarbonate: 27.2 mmol/L (ref 20.0–28.0)
Bicarbonate: 29.2 mmol/L — ABNORMAL HIGH (ref 20.0–28.0)
Bicarbonate: 25 mmol/L (ref 20.0–28.0)
Bicarbonate: 30.3 mmol/L — ABNORMAL HIGH (ref 20.0–28.0)
Bicarbonate: 26.1 mmol/L (ref 20.0–28.0)
Bicarbonate: 32.5 mmol/L — ABNORMAL HIGH (ref 20.0–28.0)
Bicarbonate: 34 mmol/L — ABNORMAL HIGH (ref 20.0–28.0)
Calcium, Ion: 1.1 mmol/L — ABNORMAL LOW (ref 1.15–1.40)
Calcium, Ion: 1.05 mmol/L — ABNORMAL LOW (ref 1.15–1.40)
Calcium, Ion: 1.07 mmol/L — ABNORMAL LOW (ref 1.15–1.40)
Calcium, Ion: 1.05 mmol/L — ABNORMAL LOW (ref 1.15–1.40)
Calcium, Ion: 1.03 mmol/L — ABNORMAL LOW (ref 1.15–1.40)
Calcium, Ion: 0.96 mmol/L — ABNORMAL LOW (ref 1.15–1.40)
Calcium, Ion: 1.02 mmol/L — ABNORMAL LOW (ref 1.15–1.40)
Calcium, Ion: 0.93 mmol/L — ABNORMAL LOW (ref 1.15–1.40)
Calcium, Ion: 0.96 mmol/L — ABNORMAL LOW (ref 1.15–1.40)
Calcium, Ion: 0.96 mmol/L — ABNORMAL LOW (ref 1.15–1.40)
HCT: 23 % — ABNORMAL LOW (ref 39.0–52.0)
HCT: 25 % — ABNORMAL LOW (ref 39.0–52.0)
HCT: 32 % — ABNORMAL LOW (ref 39.0–52.0)
HCT: 27 % — ABNORMAL LOW (ref 39.0–52.0)
HCT: 28 % — ABNORMAL LOW (ref 39.0–52.0)
HCT: 21 % — ABNORMAL LOW (ref 39.0–52.0)
HCT: 24 % — ABNORMAL LOW (ref 39.0–52.0)
HCT: 21 % — ABNORMAL LOW (ref 39.0–52.0)
HCT: 22 % — ABNORMAL LOW (ref 39.0–52.0)
HCT: 23 % — ABNORMAL LOW (ref 39.0–52.0)
Hemoglobin: 7.8 g/dL — ABNORMAL LOW (ref 13.0–17.0)
Hemoglobin: 8.5 g/dL — ABNORMAL LOW (ref 13.0–17.0)
Hemoglobin: 10.9 g/dL — ABNORMAL LOW (ref 13.0–17.0)
Hemoglobin: 9.2 g/dL — ABNORMAL LOW (ref 13.0–17.0)
Hemoglobin: 9.5 g/dL — ABNORMAL LOW (ref 13.0–17.0)
Hemoglobin: 7.1 g/dL — ABNORMAL LOW (ref 13.0–17.0)
Hemoglobin: 8.2 g/dL — ABNORMAL LOW (ref 13.0–17.0)
Hemoglobin: 7.1 g/dL — ABNORMAL LOW (ref 13.0–17.0)
Hemoglobin: 7.5 g/dL — ABNORMAL LOW (ref 13.0–17.0)
Hemoglobin: 7.8 g/dL — ABNORMAL LOW (ref 13.0–17.0)
O2 Saturation: 89 %
O2 Saturation: 90 %
O2 Saturation: 92 %
O2 Saturation: 90 %
O2 Saturation: 93 %
O2 Saturation: 91 %
O2 Saturation: 91 %
O2 Saturation: 93 %
O2 Saturation: 95 %
O2 Saturation: 95 %
Patient temperature: 37
Patient temperature: 36.7
Patient temperature: 36.4
Patient temperature: 36.4
Patient temperature: 36.5
Patient temperature: 36.3
Patient temperature: 36.5
Patient temperature: 38
Patient temperature: 37
Potassium: 4.9 mmol/L (ref 3.5–5.1)
Potassium: 4.8 mmol/L (ref 3.5–5.1)
Potassium: 4.6 mmol/L (ref 3.5–5.1)
Potassium: 4.7 mmol/L (ref 3.5–5.1)
Potassium: 4.7 mmol/L (ref 3.5–5.1)
Potassium: 4.1 mmol/L (ref 3.5–5.1)
Potassium: 4.7 mmol/L (ref 3.5–5.1)
Potassium: 4 mmol/L (ref 3.5–5.1)
Potassium: 4.3 mmol/L (ref 3.5–5.1)
Potassium: 3.9 mmol/L (ref 3.5–5.1)
Sodium: 136 mmol/L (ref 135–145)
Sodium: 137 mmol/L (ref 135–145)
Sodium: 136 mmol/L (ref 135–145)
Sodium: 136 mmol/L (ref 135–145)
Sodium: 136 mmol/L (ref 135–145)
Sodium: 139 mmol/L (ref 135–145)
Sodium: 136 mmol/L (ref 135–145)
Sodium: 140 mmol/L (ref 135–145)
Sodium: 137 mmol/L (ref 135–145)
Sodium: 137 mmol/L (ref 135–145)
TCO2: 28 mmol/L (ref 22–32)
TCO2: 29 mmol/L (ref 22–32)
TCO2: 28 mmol/L (ref 22–32)
TCO2: 29 mmol/L (ref 22–32)
TCO2: 31 mmol/L (ref 22–32)
TCO2: 27 mmol/L (ref 22–32)
TCO2: 32 mmol/L (ref 22–32)
TCO2: 27 mmol/L (ref 22–32)
TCO2: 34 mmol/L — ABNORMAL HIGH (ref 22–32)
TCO2: 36 mmol/L — ABNORMAL HIGH (ref 22–32)
pCO2 arterial: 54.9 mmHg — ABNORMAL HIGH (ref 32.0–48.0)
pCO2 arterial: 52.3 mmHg — ABNORMAL HIGH (ref 32.0–48.0)
pCO2 arterial: 56.4 mmHg — ABNORMAL HIGH (ref 32.0–48.0)
pCO2 arterial: 57.3 mmHg — ABNORMAL HIGH (ref 32.0–48.0)
pCO2 arterial: 60.2 mmHg — ABNORMAL HIGH (ref 32.0–48.0)
pCO2 arterial: 50.3 mmHg — ABNORMAL HIGH (ref 32.0–48.0)
pCO2 arterial: 56.4 mmHg — ABNORMAL HIGH (ref 32.0–48.0)
pCO2 arterial: 43.3 mmHg (ref 32.0–48.0)
pCO2 arterial: 54.9 mmHg — ABNORMAL HIGH (ref 32.0–48.0)
pCO2 arterial: 52.6 mmHg — ABNORMAL HIGH (ref 32.0–48.0)
pH, Arterial: 7.287 — ABNORMAL LOW (ref 7.350–7.450)
pH, Arterial: 7.325 — ABNORMAL LOW (ref 7.350–7.450)
pH, Arterial: 7.28 — ABNORMAL LOW (ref 7.350–7.450)
pH, Arterial: 7.281 — ABNORMAL LOW (ref 7.350–7.450)
pH, Arterial: 7.292 — ABNORMAL LOW (ref 7.350–7.450)
pH, Arterial: 7.301 — ABNORMAL LOW (ref 7.350–7.450)
pH, Arterial: 7.335 — ABNORMAL LOW (ref 7.350–7.450)
pH, Arterial: 7.387 (ref 7.350–7.450)
pH, Arterial: 7.384 (ref 7.350–7.450)
pH, Arterial: 7.418 (ref 7.350–7.450)
pO2, Arterial: 65 mmHg — ABNORMAL LOW (ref 83.0–108.0)
pO2, Arterial: 63 mmHg — ABNORMAL LOW (ref 83.0–108.0)
pO2, Arterial: 71 mmHg — ABNORMAL LOW (ref 83.0–108.0)
pO2, Arterial: 65 mmHg — ABNORMAL LOW (ref 83.0–108.0)
pO2, Arterial: 74 mmHg — ABNORMAL LOW (ref 83.0–108.0)
pO2, Arterial: 64 mmHg — ABNORMAL LOW (ref 83.0–108.0)
pO2, Arterial: 64 mmHg — ABNORMAL LOW (ref 83.0–108.0)
pO2, Arterial: 66 mmHg — ABNORMAL LOW (ref 83.0–108.0)
pO2, Arterial: 85 mmHg (ref 83.0–108.0)
pO2, Arterial: 79 mmHg — ABNORMAL LOW (ref 83.0–108.0)

## 2019-08-14 LAB — CBC WITH DIFFERENTIAL/PLATELET
Abs Immature Granulocytes: 0.23 10*3/uL — ABNORMAL HIGH (ref 0.00–0.07)
Basophils Absolute: 0 10*3/uL (ref 0.0–0.1)
Basophils Relative: 0 %
Eosinophils Absolute: 0 10*3/uL (ref 0.0–0.5)
Eosinophils Relative: 0 %
HCT: 25.4 % — ABNORMAL LOW (ref 39.0–52.0)
Hemoglobin: 7.9 g/dL — ABNORMAL LOW (ref 13.0–17.0)
Immature Granulocytes: 2 %
Lymphocytes Relative: 2 %
Lymphs Abs: 0.2 10*3/uL — ABNORMAL LOW (ref 0.7–4.0)
MCH: 30.6 pg (ref 26.0–34.0)
MCHC: 31.1 g/dL (ref 30.0–36.0)
MCV: 98.4 fL (ref 80.0–100.0)
Monocytes Absolute: 0.2 10*3/uL (ref 0.1–1.0)
Monocytes Relative: 2 %
Neutro Abs: 10.1 10*3/uL — ABNORMAL HIGH (ref 1.7–7.7)
Neutrophils Relative %: 94 %
Platelets: 130 10*3/uL — ABNORMAL LOW (ref 150–400)
RBC: 2.58 MIL/uL — ABNORMAL LOW (ref 4.22–5.81)
RDW: 17 % — ABNORMAL HIGH (ref 11.5–15.5)
WBC: 10.8 10*3/uL — ABNORMAL HIGH (ref 4.0–10.5)
nRBC: 1 % — ABNORMAL HIGH (ref 0.0–0.2)

## 2019-08-14 LAB — POCT I-STAT EG7
Acid-Base Excess: 2 mmol/L (ref 0.0–2.0)
Acid-Base Excess: 3 mmol/L — ABNORMAL HIGH (ref 0.0–2.0)
Acid-Base Excess: 4 mmol/L — ABNORMAL HIGH (ref 0.0–2.0)
Acid-Base Excess: 5 mmol/L — ABNORMAL HIGH (ref 0.0–2.0)
Acid-base deficit: 2 mmol/L (ref 0.0–2.0)
Acid-base deficit: 2 mmol/L (ref 0.0–2.0)
Bicarbonate: 26.2 mmol/L (ref 20.0–28.0)
Bicarbonate: 26.5 mmol/L (ref 20.0–28.0)
Bicarbonate: 27.9 mmol/L (ref 20.0–28.0)
Bicarbonate: 28.9 mmol/L — ABNORMAL HIGH (ref 20.0–28.0)
Bicarbonate: 28.9 mmol/L — ABNORMAL HIGH (ref 20.0–28.0)
Bicarbonate: 30.1 mmol/L — ABNORMAL HIGH (ref 20.0–28.0)
Bicarbonate: 31 mmol/L — ABNORMAL HIGH (ref 20.0–28.0)
Calcium, Ion: 0.51 mmol/L — CL (ref 1.15–1.40)
Calcium, Ion: 0.47 mmol/L — CL (ref 1.15–1.40)
Calcium, Ion: 0.49 mmol/L — CL (ref 1.15–1.40)
Calcium, Ion: 0.48 mmol/L — CL (ref 1.15–1.40)
Calcium, Ion: 0.45 mmol/L — CL (ref 1.15–1.40)
Calcium, Ion: 0.41 mmol/L — CL (ref 1.15–1.40)
Calcium, Ion: 0.41 mmol/L — CL (ref 1.15–1.40)
HCT: 28 % — ABNORMAL LOW (ref 39.0–52.0)
HCT: 38 % — ABNORMAL LOW (ref 39.0–52.0)
HCT: 29 % — ABNORMAL LOW (ref 39.0–52.0)
HCT: 25 % — ABNORMAL LOW (ref 39.0–52.0)
HCT: 25 % — ABNORMAL LOW (ref 39.0–52.0)
HCT: 24 % — ABNORMAL LOW (ref 39.0–52.0)
HCT: 25 % — ABNORMAL LOW (ref 39.0–52.0)
Hemoglobin: 9.5 g/dL — ABNORMAL LOW (ref 13.0–17.0)
Hemoglobin: 12.9 g/dL — ABNORMAL LOW (ref 13.0–17.0)
Hemoglobin: 9.9 g/dL — ABNORMAL LOW (ref 13.0–17.0)
Hemoglobin: 8.5 g/dL — ABNORMAL LOW (ref 13.0–17.0)
Hemoglobin: 8.5 g/dL — ABNORMAL LOW (ref 13.0–17.0)
Hemoglobin: 8.2 g/dL — ABNORMAL LOW (ref 13.0–17.0)
Hemoglobin: 8.5 g/dL — ABNORMAL LOW (ref 13.0–17.0)
O2 Saturation: 71 %
O2 Saturation: 73 %
O2 Saturation: 72 %
O2 Saturation: 70 %
O2 Saturation: 75 %
O2 Saturation: 77 %
O2 Saturation: 70 %
Patient temperature: 36.4
Patient temperature: 36.5
Patient temperature: 36.4
Patient temperature: 36.4
Patient temperature: 38
Patient temperature: 37
Potassium: 4.5 mmol/L (ref 3.5–5.1)
Potassium: 4.4 mmol/L (ref 3.5–5.1)
Potassium: 4.5 mmol/L (ref 3.5–5.1)
Potassium: 4.4 mmol/L (ref 3.5–5.1)
Potassium: 4.3 mmol/L (ref 3.5–5.1)
Potassium: 4.1 mmol/L (ref 3.5–5.1)
Potassium: 3.8 mmol/L (ref 3.5–5.1)
Sodium: 137 mmol/L (ref 135–145)
Sodium: 138 mmol/L (ref 135–145)
Sodium: 138 mmol/L (ref 135–145)
Sodium: 138 mmol/L (ref 135–145)
Sodium: 139 mmol/L (ref 135–145)
Sodium: 139 mmol/L (ref 135–145)
Sodium: 139 mmol/L (ref 135–145)
TCO2: 28 mmol/L (ref 22–32)
TCO2: 28 mmol/L (ref 22–32)
TCO2: 30 mmol/L (ref 22–32)
TCO2: 31 mmol/L (ref 22–32)
TCO2: 30 mmol/L (ref 22–32)
TCO2: 32 mmol/L (ref 22–32)
TCO2: 33 mmol/L — ABNORMAL HIGH (ref 22–32)
pCO2, Ven: 59.8 mmHg (ref 44.0–60.0)
pCO2, Ven: 61.4 mmHg — ABNORMAL HIGH (ref 44.0–60.0)
pCO2, Ven: 62.7 mmHg — ABNORMAL HIGH (ref 44.0–60.0)
pCO2, Ven: 59.4 mmHg (ref 44.0–60.0)
pCO2, Ven: 51.6 mmHg (ref 44.0–60.0)
pCO2, Ven: 58.9 mmHg (ref 44.0–60.0)
pCO2, Ven: 54.9 mmHg (ref 44.0–60.0)
pH, Ven: 7.247 — ABNORMAL LOW (ref 7.250–7.430)
pH, Ven: 7.241 — ABNORMAL LOW (ref 7.250–7.430)
pH, Ven: 7.253 (ref 7.250–7.430)
pH, Ven: 7.293 (ref 7.250–7.430)
pH, Ven: 7.356 (ref 7.250–7.430)
pH, Ven: 7.321 (ref 7.250–7.430)
pH, Ven: 7.36 (ref 7.250–7.430)
pO2, Ven: 43 mmHg (ref 32.0–45.0)
pO2, Ven: 45 mmHg (ref 32.0–45.0)
pO2, Ven: 43 mmHg (ref 32.0–45.0)
pO2, Ven: 41 mmHg (ref 32.0–45.0)
pO2, Ven: 42 mmHg (ref 32.0–45.0)
pO2, Ven: 49 mmHg — ABNORMAL HIGH (ref 32.0–45.0)
pO2, Ven: 39 mmHg (ref 32.0–45.0)

## 2019-08-14 LAB — RENAL FUNCTION PANEL
Albumin: 2.4 g/dL — ABNORMAL LOW (ref 3.5–5.0)
Albumin: 2.2 g/dL — ABNORMAL LOW (ref 3.5–5.0)
Anion gap: 14 (ref 5–15)
Anion gap: 11 (ref 5–15)
BUN: 49 mg/dL — ABNORMAL HIGH (ref 8–23)
BUN: 53 mg/dL — ABNORMAL HIGH (ref 8–23)
CO2: 27 mmol/L (ref 22–32)
CO2: 31 mmol/L (ref 22–32)
Calcium: 7.6 mg/dL — ABNORMAL LOW (ref 8.9–10.3)
Calcium: 7.5 mg/dL — ABNORMAL LOW (ref 8.9–10.3)
Chloride: 95 mmol/L — ABNORMAL LOW (ref 98–111)
Chloride: 96 mmol/L — ABNORMAL LOW (ref 98–111)
Creatinine, Ser: 2.39 mg/dL — ABNORMAL HIGH (ref 0.61–1.24)
Creatinine, Ser: 2.28 mg/dL — ABNORMAL HIGH (ref 0.61–1.24)
GFR calc Af Amer: 32 mL/min — ABNORMAL LOW (ref >60)
GFR calc Af Amer: 33 mL/min — ABNORMAL LOW (ref >60)
GFR calc non Af Amer: 27 mL/min — ABNORMAL LOW (ref >60)
GFR calc non Af Amer: 29 mL/min — ABNORMAL LOW (ref >60)
Glucose, Bld: 237 mg/dL — ABNORMAL HIGH (ref 70–99)
Glucose, Bld: 196 mg/dL — ABNORMAL HIGH (ref 70–99)
Phosphorus: 5.6 mg/dL — ABNORMAL HIGH (ref 2.5–4.6)
Phosphorus: 3.3 mg/dL (ref 2.5–4.6)
Potassium: 4.6 mmol/L (ref 3.5–5.1)
Potassium: 3.8 mmol/L (ref 3.5–5.1)
Sodium: 136 mmol/L (ref 135–145)
Sodium: 138 mmol/L (ref 135–145)

## 2019-08-14 LAB — TYPE AND SCREEN
ABO/RH(D): O POS
Antibody Screen: NEGATIVE
Unit division: 0

## 2019-08-14 LAB — HEPATIC FUNCTION PANEL
ALT: 8 U/L (ref 0–44)
AST: 25 U/L (ref 15–41)
Albumin: 2.4 g/dL — ABNORMAL LOW (ref 3.5–5.0)
Alkaline Phosphatase: 61 U/L (ref 38–126)
Bilirubin, Direct: 0.1 mg/dL (ref 0.0–0.2)
Indirect Bilirubin: 0.7 mg/dL (ref 0.3–0.9)
Total Bilirubin: 0.8 mg/dL (ref 0.3–1.2)
Total Protein: 6.2 g/dL — ABNORMAL LOW (ref 6.5–8.1)

## 2019-08-14 LAB — BPAM RBC
Blood Product Expiration Date: 202011222359
ISSUE DATE / TIME: 202010211845
Unit Type and Rh: 5100

## 2019-08-14 LAB — GLUCOSE, CAPILLARY
Glucose-Capillary: 172 mg/dL — ABNORMAL HIGH (ref 70–99)
Glucose-Capillary: 176 mg/dL — ABNORMAL HIGH (ref 70–99)
Glucose-Capillary: 181 mg/dL — ABNORMAL HIGH (ref 70–99)
Glucose-Capillary: 231 mg/dL — ABNORMAL HIGH (ref 70–99)
Glucose-Capillary: 248 mg/dL — ABNORMAL HIGH (ref 70–99)
Glucose-Capillary: 254 mg/dL — ABNORMAL HIGH (ref 70–99)

## 2019-08-14 LAB — C-REACTIVE PROTEIN: CRP: 15.8 mg/dL — ABNORMAL HIGH (ref <1.0)

## 2019-08-14 LAB — D-DIMER, QUANTITATIVE: D-Dimer, Quant: 7.45 ug/mL-FEU — ABNORMAL HIGH (ref 0.00–0.50)

## 2019-08-14 LAB — HEMOGLOBIN AND HEMATOCRIT, BLOOD
HCT: 26.9 % — ABNORMAL LOW (ref 39.0–52.0)
Hemoglobin: 8.4 g/dL — ABNORMAL LOW (ref 13.0–17.0)

## 2019-08-14 LAB — MAGNESIUM: Magnesium: 2.3 mg/dL (ref 1.7–2.4)

## 2019-08-14 MED ORDER — MIDAZOLAM HCL 2 MG/2ML IJ SOLN
2.0000 mg | INTRAMUSCULAR | Status: DC | PRN
  Administered 2019-08-15 – 2019-08-17 (×7): 2 mg via INTRAVENOUS
  Filled 2019-08-14 (×7): qty 2

## 2019-08-14 MED ORDER — VORICONAZOLE 200 MG PO TABS
200.0000 mg | ORAL_TABLET | Freq: Two times a day (BID) | ORAL | Status: DC
  Filled 2019-08-14 (×3): qty 1

## 2019-08-14 MED ORDER — HYDROCORTISONE NA SUCCINATE PF 100 MG IJ SOLR
50.0000 mg | Freq: Two times a day (BID) | INTRAMUSCULAR | Status: AC
  Administered 2019-08-14 – 2019-08-15 (×2): 50 mg via INTRAVENOUS
  Filled 2019-08-14 (×2): qty 2

## 2019-08-14 MED ORDER — VORICONAZOLE 200 MG PO TABS
400.0000 mg | ORAL_TABLET | Freq: Two times a day (BID) | ORAL | Status: AC
  Administered 2019-08-14 (×2): 400 mg
  Filled 2019-08-14 (×2): qty 2

## 2019-08-14 MED ORDER — STERILE WATER FOR INJECTION IJ SOLN
50.0000 mg | Freq: Once | INTRAVENOUS | Status: AC
  Administered 2019-08-14: 50 mg via INTRAPLEURAL
  Filled 2019-08-14: qty 50

## 2019-08-14 NOTE — Progress Notes (Signed)
RT Note:  Endobronchial blocker secured @ 52.

## 2019-08-14 NOTE — Progress Notes (Signed)
[facility] ROUNDING NOTE   Subjective:   This is a 68 year old gentleman who was admitted with Covid pneumonia July 13, 2019 completed course of remdesivir convalescent plasma Decadron treated with vancomycin and cefepime from 08/01/2019 until 08/02/2019.  On 08/02/2019 patient developed an ileus.  His creatinine on 07/25/2019 was 1.5 which appears to be his baseline.  On 08/02/2019 started to increase.  On 08/02/2019 was found to have MSSA bacteremia and was started on Ancef.  His blood pressure has been marginal with drops in blood pressure to 70 mmHg.  His renal ultrasound showed no hydronephrosis on 08/07/2019. Pt had administration of intravenous contrast on 07/25/2019.  Remains on levo gtt.   Remains off of IV heparin due to poss GIB.   I/O even from yesterday.   No sig UOP. Continues on CRRT. D#8     Objective:  Vital signs in last 24 hours:  Temp:  [97.3 F (36.3 C)-99.1 F (37.3 C)] 98.2 F (36.8 C) (10/22 0800) Pulse Rate:  [71-103] 92 (10/22 1545) Resp:  [0-53] 26 (10/22 1545) BP: (104-136)/(43-70) 112/66 (10/22 1400) SpO2:  [91 %-100 %] 100 % (10/22 1545) Arterial Line BP: (95-156)/(42-57) 107/51 (10/22 1545) FiO2 (%):  [50 %-70 %] 50 % (10/22 1558) Weight:  [88.6 kg] 88.6 kg (10/22 0500)  Exam:   Patient not examined directly given COVID-19 + status, utilizing exam of the primary team and observations of RN's.    Assessment/ Plan:   Acute kidney injury: on CKD w/ baseline creat of 1.5. Admitted with Covid pneumonia complicated course with recent history of MSSA bacteremia.  Developed shock hypotension and required pressors.  No evidence of hydronephrosis on renal ultrasound. Suspected ATN. There was administration of contrast but this was early on the course of his illness on 07/25/2019. He developed progressive metabolic acidosis and was started on CRRT 08/07/2019. [name]  Systemic IV hep dc'd 10/21 and citrate restarted  Keeping even w/  CRRT  Anemia / sp GIB - as per primary team transfuse as necessary  COVID-19 pneumonia-  Treated with remdesivir, dexamethasone and convalescent plasma  Afib / RVR - new, IV amio  GIB - acute, sp EGD 10/17 w/ large gastric ulcer/ w visible vessel, Rx'd by clipping  VDRF/ R PTX w/ chest tube  Shock - weaning pressors down, BP's low 100's on levo gtt  MSSA bacteremia/ ? Fungal airway infectino - on IV Ancef and antifungals   LOS: 15 [name] @TODAY @8 :11 PM   Basic Metabolic Panel: Recent Labs  Lab 08/10/19 0500  08/11/19 0500  08/12/19 0500  08/12/19 1600  08/13/19 0500 08/13/19 1740  08/14/19 0530 08/14/19 0711 08/14/19 0724 08/14/19 1128 08/14/19 1134  NA 141  143   < > 139  139   < > 140  140   < > 139   < > 135  134* 137   < > 136 138 136 140 139  K 4.9  5.0   < > 4.5  4.5   < > 4.0  4.0   < > 4.6   < > 4.5  4.5 5.1   < > 4.6 4.4 4.7 4.0 4.3  CL 103  104   < > 93*  93*   < > 95*  94*  --  94*  --  97*  96* 101  --  95*  --   --   --   --   CO2 25  25   < >  33*  34*   < > 33*  32  --  32  --  27  27 24   --  27  --   --   --   --   GLUCOSE 188*  187*   < > 130*  129*   < > 215*  216*  --  188*  --  113*  112* 119*  --  237*  --   --   --   --   BUN 94*  93*   < > 66*  66*   < > 45*  46*  --  39*  --  44*  43* 48*  --  49*  --   --   --   --   CREATININE 2.81*  2.82*   < > 2.49*  2.50*   < > 1.89*  1.91*  --  1.81*  --  1.86*  1.79* 2.48*  --  2.39*  --   --   --   --   CALCIUM 8.5*  8.6*   < > 9.3  9.3   < > 7.3*  7.3*  --  7.5*  --  7.9*  7.9* 8.0*  --  7.6*  --   --   --   --   MG 2.5*  --  2.3  --  2.1  --   --   --  2.4  --   --  2.3  --   --   --   --   PHOS 6.1*  6.2*   < > 5.0*  4.8*   < > 2.6  --  3.6  --  3.4 6.5*  --  5.6*  --   --   --   --    < > = values in this interval not displayed.    Liver Function Tests: Recent Labs  Lab 08/10/19 0500  08/11/19 0500  08/12/19 0500 08/12/19 1600 08/13/19 0500  08/13/19 1740 08/14/19 0530  AST 34  --  37  --  32  --  24  --  25  ALT 9  --  10  --  9  --  9  --  8  ALKPHOS 61  --  65  --  63  --  52  --  61  BILITOT 0.3  --  0.4  --  0.5  --  0.5  --  0.8  PROT 5.6*  --  5.9*  --  5.9*  --  6.2*  --  6.2*  ALBUMIN 2.2*  2.2*   < > 2.2*  2.3*   < > 2.1*  2.1* 2.6* 2.8*  2.8* 2.6* 2.4*  2.4*   < > = values in this interval not displayed.   No results for input(s): LIPASE, AMYLASE in the last 168 hours. No results for input(s): AMMONIA in the last 168 hours.  CBC: Recent Labs  Lab 08/10/19 0500  08/11/19 0500  08/12/19 0500  08/13/19 0500 08/13/19 1030  08/14/19 0530 08/14/19 0711 08/14/19 0724 08/14/19 1128 08/14/19 1134  WBC 8.7  --  8.6  --  8.3  --  9.2 11.9*  --  10.8*  --   --   --   --   NEUTROABS 7.7  --  6.9  --  6.7  --  8.0*  --   --  10.1*  --   --   --   --   HGB 7.3*   < >  7.6*   < > 7.5*   < > 6.6* 6.8*   < > 7.9* 8.5* 8.2* 7.1* 8.5*  HCT 23.1*   < > 23.8*   < > 24.2*   < > 21.7* 22.1*   < > 25.4* 25.0* 24.0* 21.0* 25.0*  MCV 97.1  --  97.9  --  99.2  --  101.4* 101.8*  --  98.4  --   --   --   --   PLT 100*  --  114*  --  119*  --  118* 122*  --  130*  --   --   --   --    < > = values in this interval not displayed.    Cardiac Enzymes: No results for input(s): CKTOTAL, CKMB, CKMBINDEX, TROPONINI in the last 168 hours.  BNP: Invalid input(s): POCBNP  CBG: Recent Labs  Lab 08/14/19 0039 08/14/19 0518 08/14/19 0723 08/14/19 1127 08/14/19 1537  GLUCAP 248* 254* 231* 172* 176*     Medications:   .  prismasol BGK 4/2.5 200 mL/hr at 08/14/19 0200  . sodium chloride 10 mL/hr at 08/09/19 1000  . calcium gluconate infusion for CRRT 20 g (08/14/19 1446)  .  ceFAZolin (ANCEF) IV Stopped (08/14/19 1356)  . feeding supplement (PIVOT 1.5 CAL) Stopped (08/14/19 1200)  . fentaNYL infusion INTRAVENOUS 30 mcg/hr (08/14/19 1500)  . norepinephrine (LEVOPHED) Adult infusion 3 mcg/min (08/14/19 1500)  . prismasol  B22GK 4/0 1,800 mL/hr at 08/14/19 1528  . sodium citrate 2 %/dextrose 2.5% solution 3000 mL 370 mL/hr at 08/14/19 1545   . sodium chloride   Intravenous Once  . chlorhexidine  15 mL Mouth/Throat BID  . Chlorhexidine Gluconate Cloth  6 each Topical Daily  . feeding supplement (PRO-STAT SUGAR FREE 64)  60 mL Per Tube TID  . hydrocortisone sodium succinate  50 mg Intravenous Q12H  . insulin aspart  0-9 Units Subcutaneous Q4H  . mouth rinse  15 mL Mouth Rinse 10 times per day  . pantoprazole (PROTONIX) IV  40 mg Intravenous Q12H  . polyethylene glycol  17 g Per Tube BID  . sennosides  5 mL Per Tube BID  . sodium chloride HYPERTONIC  4 mL Nebulization BID  . sucralfate  1 g Oral Q6H  . tamsulosin  0.4 mg Oral BID  . voriconazole  400 mg Per Tube Q12H   Followed by  . [START ON 08/15/2019] voriconazole  200 mg Per Tube Q12H   Place/Maintain arterial line **AND** sodium chloride, acetaminophen (TYLENOL) oral liquid 160 mg/5 mL, acetaminophen, fentaNYL, heparin, influenza vaccine adjuvanted, midazolam, pneumococcal 23 valent vaccine, sodium chloride

## 2019-08-14 NOTE — Progress Notes (Signed)
RT Note:  Endobronchial blocker secured @ 70.

## 2019-08-14 NOTE — Progress Notes (Signed)
Assited MD with bedside bronchoscopy to evaluate endobronchial blocker balloon placement.  Patient tolerated procedure well.

## 2019-08-14 NOTE — Op Note (Signed)
PCCM Video Bronchoscopy Procedure Note  The patient was informed of the risks (including but not limited to bleeding, infection, respiratory failure, lung injury, tooth/oral injury) and benefits of the procedure and gave consent, see chart.  Indication: Inspection of endobronchial blocker, continued air leak from chest tube  Post Procedure Diagnosis: same  Location: [facility] ICU  Condition pre procedure: critically ill, on vent  Medications for procedure: fentanyl infusion, midazolam infusion  Procedure description: The bronchoscope was introduced through the endotracheal tube and passed to the bilateral lungs to the level of the subsegmental bronchi throughout the tracheobronchial tree.  Airway exam revealed reduced secretions compared to the last bronchoscopy, endobronchial blocker had migrated proximately.  The blocker was repositioned at 56cm and an additional 1 cc of saline was instilled so it currently has 3cc total in the balloon.  The RLL orifice was occluded, RML orifice was open.  No airway necrosis noted on inspection.  Procedures performed: none  Specimens sent: none  Condition post procedure: critically ill, on vent  EBL: none  Complications: none  Roselie Awkward, MD [place] [facility]M Pager: [phone] Cell: [phone] If no response, call [phone]

## 2019-08-14 NOTE — Progress Notes (Signed)
NAME:  Rafel Garde, MRN:  [id number], DOB:  03-13-1951, LOS: 27 ADMISSION DATE:  07/25/2019, CONSULTATION DATE:  10/3 REFERRING MD:  Nadara Mustard, CHIEF COMPLAINT:  Dyspnea   Brief History   68 yo male dx with COVID 07/22/19 presented to [facility] ER 07/25/19 with progressive dyspnea and hypoxia requiring intubation from COVID 19 pneumonia.  Past Medical History  GERD  Significant Hospital Events   10/03 Admit to [facility] from [facility] ER, start decadron and remdesivir, given tociluzimab; prone positioning 10/04 convalescent plasma 10/05 stop prone positioning 10/06 convalescent plasma; prone positioning again 10/09 start ABx 10/10 MSSA bacteremia, CVL d/ced; ID consulted; increase OG tube outpt 10/11 vent weaning trial started 10/13 fever, pneumothorax, pig tail chest tube placed 10/14 worsening hypotension and renal fx >> consulted nephrology; worsening PTX >> replaced chest tube; GNR in sputum >> ABx changed 10/15 start CRRT; endobronchial blocker placed for persistent air leak 10/16 endobronchial blocker repositioned, changed chest tube to water seal; melana with ABLA >> GI consulted; transfuse PRBC 10/17 persistent air leak, increased WOB; start nimbex gtt >> air leak decreased; EGD; A fib with RVR >> start amiodarone 10/18 transfuse PRBC; GI s/o 10/20 resume heparin gtt 10/21 stopped nimbex, chest tube to suction, stopped heparin drip because Hgb dropped again 10/22 TPA in chest tube, repositioned endobronchial blocker  Consults:  ID Nephrology Gastroenterology PCCM  Procedures:  10/2 ETT>  10/2 R IJ CVL > 10/10 10/13 Rt pig tail chest tube >> 10/14 10/14 R 74F chest tube >  10/14 R subclavian CVL >  10/14 L IJ HD cath >  10/14 Lt radial a line >   Significant Diagnostic Tests:  10/2 CT angiogram chest > extensive bilateral airspace disease predominantly posterior 10/8 doppler legs b/l > no DVT 10/15 renal u/s > normal 10/17 EGD > non bleeding gastric ulcer  10/19 Echo >> EF 60  to 65%, hyperdynamic LV with small LVOT gradient 10/22 CT chest > large collection of air in pleural space with fluid, pneumonia bilaterally R>L endobronchial blocker in place, chest tube anterior  Micro Data:  9/20 SARS COV 2 > POSITIVE 10/2 blood > negative 10/9 blood > MSSA 2/4 10/9 resp > MSSA, Pseudomonas 10/13 blood >> negative 10/21 bronch wash bacterial >  10/21 bronch wash fungal >  10/21 bronch wash aspergillus antigen>   Antimicrobials:  10/3 remdesivir > 10/6 10/3 actemra 10/3 decadron > 10/12 10/4 convalescent plasma 10/6 convalescent plasma  10/9 vancomycin > 10/10 10/9 cefepime > 10/10 10/10 ancef > 10/13 10/13 cefepime > 10/20 10/14 anidulofungin > 10/15  10/20 ancef >   10/21 anidulofungin>   Interim history/subjective:   Chest tube back to suction CT chest yesterday shows large pocket of air in pleural space Stopped nimbex  Objective   Blood pressure (!) 124/57, pulse 81, temperature 97.7 F (36.5 C), temperature source Core, resp. rate (!) 24, height 5\' 10"  (1.778 m), weight 88.6 kg, SpO2 100 %. CVP:  [0 mmHg-10 mmHg] 9 mmHg  Vent Mode: PRVC FiO2 (%):  [45 %-70 %] 50 % Set Rate:  [24 bmp] 24 bmp Vt Set:  [480 mL] 480 mL PEEP:  [8 cmH20] 8 cmH20 Plateau Pressure:  [24 cmH20-30 cmH20] 26 cmH20   Intake/Output Summary (Last 24 hours) at 08/14/2019 0801 Last data filed at 08/14/2019 0700 Gross per 24 hour  Intake 3906.73 ml  Output 3750 ml  Net 156.73 ml   Filed Weights   08/12/19 0500 08/13/19 0500 08/14/19 0500  Weight: 91.2 kg  90.8 kg 88.6 kg    Examination:  General:  In bed on vent HENT: NCAT ETT in place PULM: Rhonchi on R, CTA on L, vent supported breathing CV: RRR, no mgr GI: BS+, soft, nontender MSK: normal bulk and tone Neuro: sedated on vent, some gag, eye opening  10/22 chest x-ray images independently reviewed showing chest tube in place, pocket of air in R lung not clearly visible  Resolved Hospital Problem list      Assessment & Plan:  ARDS due to COVID-19 pneumonia: improving oxygenation, still has large air leak but vent mechanics and oxygenation better Continue mechanical ventilation per ARDS protocol Target TVol 6-8cc/kgIBW Target Plateau Pressure < 30cm H20 Target driving pressure less than 15 cm of water Target PaO2 55-65: titrate PEEP/FiO2 per protocol As long as PaO2 to FiO2 ratio is less than 1:150 position in prone position for 16 hours a day Check CVP daily if CVL in place Target CVP less than 4, diurese as necessary Ventilator associated pneumonia prevention protocol 10/22 plan wean sedation  Pneumothorax right lower lobe, persistent air leak Likely empyema given purulent drainage from R chest tube and multiple pockets of air TPA today to facilitate drainage from chest tube (done this morning on rounds between 9 AM and 11 AM) Continue MSSA treatment Bronchoscopy today to inspect/reposition endobronchial blocker Keep bronchial blocker at 56cm with 3cc saline instilled Chest tube to -20 suction Monitor chest tube output Would need to see him stronger (awake, off vasopressors) prior to considering any sort of endobronchial valve or VATS (doubt valve will help much with pneumonia in his lung which otherwise won't clear)  Acute kidney injury: Continue CVVHD Monitor UOP Monitor BMET and UOP Replace electrolytes as needed Appreciate nephrology  Circulatory shock: suspect sedation related Continue to titrate levophed for MAP > 65  Need for sedation/mechanical ventilation RASS target 0 to -1 Appears very weak on exam 10/22 Continue fentanyl infusion for now Stop versed infusion, change to prn Stop oxycodone Stop seroquel  Gastric ulcer: Continue PPI  Anemia, acute hemorrhagic related to GI bleed last week, now minimal oozing/bleeding, but hgb dropped 10/21 Stop heparin Monitor for bleeding Transfuse PRBC for Hgb < 7 gm/dL Consider sub cutaneous heparin alone on 10/23 for DVT  prophylaxis  MSSA bacteremia Thick secretions from airway, ?fungal infection: concern for invasive mold infection Cefazolin Change anidulofungin to voriconazole F/u bronch cultures  Atrial fib : resolved? Tele Hold amiodarone   Best practice:  Diet: tube feeding Pain/Anxiety/Delirium protocol (if indicated): as above VAP protocol (if indicated): yes DVT prophylaxis: change hep infusion to prophylactic dosing GI prophylaxis: pantoprazole Glucose control: SSI Mobility: bed rest Code Status: DNR Family Communication: I updated his wife [name] by phone today Disposition: remain in ICU  Labs   CBC: Recent Labs  Lab 08/10/19 0500  08/11/19 0500  08/12/19 0500  08/13/19 0500 08/13/19 1030  08/14/19 0515 08/14/19 0521 08/14/19 0530 08/14/19 0711 08/14/19 0724  WBC 8.7  --  8.6  --  8.3  --  9.2 11.9*  --   --   --  10.8*  --   --   NEUTROABS 7.7  --  6.9  --  6.7  --  8.0*  --   --   --   --  10.1*  --   --   HGB 7.3*   < > 7.6*   < > 7.5*   < > 6.6* 6.8*   < > 7.1* 9.9* 7.9* 8.5* 8.2*  HCT  23.1*   < > 23.8*   < > 24.2*   < > 21.7* 22.1*   < > 21.0* 29.0* 25.4* 25.0* 24.0*  MCV 97.1  --  97.9  --  99.2  --  101.4* 101.8*  --   --   --  98.4  --   --   PLT 100*  --  114*  --  119*  --  118* 122*  --   --   --  130*  --   --    < > = values in this interval not displayed.    Basic Metabolic Panel: Recent Labs  Lab 08/10/19 0500  08/11/19 0500  08/12/19 0500  08/12/19 1600  08/13/19 0500 08/13/19 1740  08/14/19 0515 08/14/19 0521 08/14/19 0530 08/14/19 0711 08/14/19 0724  NA 141  143   < > 139  139   < > 140  140   < > 139   < > 135  134* 137   < > 139 138 136 138 136  K 4.9  5.0   < > 4.5  4.5   < > 4.0  4.0   < > 4.6   < > 4.5  4.5 5.1   < > 4.1 4.5 4.6 4.4 4.7  CL 103  104   < > 93*  93*   < > 95*  94*  --  94*  --  97*  96* 101  --   --   --  95*  --   --   CO2 25  25   < > 33*  34*   < > 33*  32  --  32  --  27  27 24   --   --   --  27  --   --    GLUCOSE 188*  187*   < > 130*  129*   < > 215*  216*  --  188*  --  113*  112* 119*  --   --   --  237*  --   --   BUN 94*  93*   < > 66*  66*   < > 45*  46*  --  39*  --  44*  43* 48*  --   --   --  49*  --   --   CREATININE 2.81*  2.82*   < > 2.49*  2.50*   < > 1.89*  1.91*  --  1.81*  --  1.86*  1.79* 2.48*  --   --   --  2.39*  --   --   CALCIUM 8.5*  8.6*   < > 9.3  9.3   < > 7.3*  7.3*  --  7.5*  --  7.9*  7.9* 8.0*  --   --   --  7.6*  --   --   MG 2.5*  --  2.3  --  2.1  --   --   --  2.4  --   --   --   --  2.3  --   --   PHOS 6.1*  6.2*   < > 5.0*  4.8*   < > 2.6  --  3.6  --  3.4 6.5*  --   --   --  5.6*  --   --    < > = values in this interval not displayed.   GFR: Estimated Creatinine Clearance: 34.1 mL/min (A) (by  C-G formula based on SCr of 2.39 mg/dL (H)). Recent Labs  Lab 08/12/19 0500 08/13/19 0500 08/13/19 1030 08/14/19 0530  WBC 8.3 9.2 11.9* 10.8*    Liver Function Tests: Recent Labs  Lab 08/10/19 0500  08/11/19 0500  08/12/19 0500 08/12/19 1600 08/13/19 0500 08/13/19 1740 08/14/19 0530  AST 34  --  37  --  32  --  24  --  25  ALT 9  --  10  --  9  --  9  --  8  ALKPHOS 61  --  65  --  63  --  52  --  61  BILITOT 0.3  --  0.4  --  0.5  --  0.5  --  0.8  PROT 5.6*  --  5.9*  --  5.9*  --  6.2*  --  6.2*  ALBUMIN 2.2*  2.2*   < > 2.2*  2.3*   < > 2.1*  2.1* 2.6* 2.8*  2.8* 2.6* 2.4*  2.4*   < > = values in this interval not displayed.   No results for input(s): LIPASE, AMYLASE in the last 168 hours. No results for input(s): AMMONIA in the last 168 hours.  ABG    Component Value Date/Time   PHART 7.335 (L) 08/14/2019 0724   PCO2ART 56.4 (H) 08/14/2019 0724   PO2ART 64.0 (L) 08/14/2019 0724   HCO3 30.3 (H) 08/14/2019 0724   TCO2 32 08/14/2019 0724   ACIDBASEDEF 2.0 08/14/2019 0515   O2SAT 91.0 08/14/2019 0724     Coagulation Profile: No results for input(s): INR, PROTIME in the last 168 hours.  Cardiac Enzymes: No  results for input(s): CKTOTAL, CKMB, CKMBINDEX, TROPONINI in the last 168 hours.  HbA1C: Hgb A1c MFr Bld  Date/Time Value Ref Range Status  07/26/2019 05:00 AM 6.1 (H) 4.8 - 5.6 % Final    Comment:    (NOTE) Pre diabetes:          5.7%-6.4% Diabetes:              >6.4% Glycemic control for   <7.0% adults with diabetes     CBG: Recent Labs  Lab 08/13/19 1238 08/13/19 2033 08/14/19 0039 08/14/19 0518 08/14/19 0723  GLUCAP 115* 168* 248* 254* 231*       Critical care time: 45 minutes      Roselie Awkward, MD [place] [facility] Pager: [phone] Cell: [phone] If no response, call [phone]

## 2019-08-14 NOTE — Progress Notes (Signed)
Dr. Lake Bells at bedside, notified of significantly higher air leak from chest tube. Bedside bronch done, endotracheal blocker repositioned to 57. Minimal air leak noted.

## 2019-08-14 NOTE — Progress Notes (Signed)
Pharmacy Antibiotic Note  [name] is a 68 y.o. male admitted on 07/25/2019 with COVID-19 and MSSA Bacteremia, pseudomonal PNA with possible empyema. Patient underwent bronchoscopy 10/22 which found large thick, dark, dry secretions nearly occluding trachea. Pharmacy has been consulted to change Eraxis to Voriconazole due to concern for invasive mold infection.  Patient is WBC elevated and trending up at 10.9, CRP also increased from 9.8 to 15.8 today. Patient is normothermic while on CRRT, tolerating CRRT well. LFTs WNL. QTc 460 off monitor today while on concomitant Seroquel.  Plan: - Stop Eraxis - Start Voriconazole 400 mg Q12 hrs per tube for two doses, then 200 mg Q12 hrs per tube  - Check voriconazole levels at steady state in 3-5 days - Monitor cultures and sensitivities, clinical progression, CRRT/renal function  Height: _0  (177.8 cm) Weight: 195 lb 5.2 oz (88.6 kg) IBW/kg (Calculated) : 73  Temp (24hrs), Avg:98.1 F (36.7 C), Min:97.3 F (36.3 C), Max:99.1 F (37.3 C)  Recent Labs  Lab 08/11/19 0500  08/12/19 0500 08/12/19 1600 08/13/19 0500 08/13/19 1030 08/13/19 1740 08/14/19 0530  WBC 8.6  --  8.3  --  9.2 11.9*  --  10.8*  CREATININE 2.49*  2.50*   < > 1.89*  1.91* 1.81* 1.86*  1.79*  --  2.48* 2.39*   < > = values in this interval not displayed.    Estimated Creatinine Clearance: 34.1 mL/min (A) (by C-G formula based on SCr of 2.39 mg/dL (H)).    No Known Allergies  Antimicrobials this admission: 10/3 Actemra x 1  10/3 Remdesivir >> 10/6 10/9 Vancomycin >> 10/10 10/9 Cefepime >> 10/10; 10/14 >> 10/20 10/10 Cefazolin >> 10/14; 10/21 >> (11/23) 10/14 Anidulafungin >> 10/16; 10/21 x1 10/22 Voriconazole >>  Microbiology results: 10/2 BCx: negF 10/3 MRSA PCR: neg 10/9 UCx: <10K - insignificant growth 10/9 BCx: MSSA (pan-sensitive) 10/9 TA: rare pseudomonas (pan-sensitive) 10/13 BCx: negF 10/21 Fungal Cx: sent 10/21 Aspergillus BAL:  sent 10/21 BAL: moderate WBC  Richardine Service, PharmD PGY1 Pharmacy Resident 08/14/2019  1:43 PM

## 2019-08-14 NOTE — Progress Notes (Signed)
Spoke with patient's wife, [name], and provided full update/answered all questions.

## 2019-08-14 NOTE — Progress Notes (Signed)
Wasted 70ml of Versed with Adrian Prince, RN in stericycle.

## 2019-08-14 NOTE — Progress Notes (Signed)
Dr. Lake Bells at bedside to instill TPA through chest tube.

## 2019-08-14 NOTE — Progress Notes (Signed)
[name]  EZM:[id number] DOB: [birth year] DOA: 07/25/2019 PCP: Kathyrn Drown, MD    Brief Narrative:  68 year old with a history of GERD and BPH who presented to [facility], ED with S OB and was found to be hypoxic.  He was initially diagnosed with COVID-19 September 20.  1 to 2 days prior to his presentation he developed worsening shortness of breath with anorexia.  His wife's physician during a teleconference call noted that the patient himself is very tachypneic and ultimately EMS was sent to the house.  EMS found the patient to have saturations in the 40s.  In the ED on nonrebreather the patient sats only improved to the 80s.  The patient was severely confused and required emergent intubation.  Chest x-ray confirmed multifocal infiltrates.  CT chest was negative for PE  Significant Events: 10/3 admit to [facility] from [place] ED 10/13 right pneumothorax -pigtail chest tube placed 10/14 Nephrology consulted 10/15 begin CRRT -endobronchial blocker placed 10/16 melena 10/17 A. fib with RVR -EGD 10/18 transfuse PRBC 10/20 Heparin drip resumed 10/21 abrupt drop in hemoglobin -transfuse 1 unit PRBC  COVID-19 specific Treatment: Decadron 10/2 > 10/12 Remdesivir 10/2 > 10/6 Actemra 10/2 Convalescent plasma 10/3  Subjective: Remains critically ill, sedated, on ventilator.  Right chest tube in place.  Assessment & Plan:  Covid pneumonia - acute hypoxic respiratory failure Has completed a course of decadron and remdesivir - was dosed with actemra and convalescent plasma - pulmonary status remains very complicated and tenuous - ventilator management per PCCM  Recent Labs  Lab 08/10/19 0500 08/11/19 0500 08/12/19 0500 08/13/19 0500 08/14/19 0530  DDIMER 12.24* >20.00* 11.03* 5.27* 7.45*  CRP 4.8* 4.2* 5.5* 9.8* 15.8*  ALT _0 GI bleed - gastric ulcer - acute blood loss anemia Status post EGD with clipping of vessel in large ulcer - full anticoagulation was resumed  10/20 following which he has experienced an abrupt drop in his hemoglobin again - he was transfused 1 unit 10/21 - anticoagulation stopped 10/21  Recent Labs  Lab 08/14/19 0724 08/14/19 1128 08/14/19 1134  HGB 8.2* 7.1* 8.5*    Markedly elevated D-dimer signif increase noted 10/19 - unable to anticoag at present due to GIB - no evidence of DVT on dopplers 10/8 -likely related to suspected empyema right lung  Pneumothorax right -empyema CT chest 10/21 noted findings most consistent with an empyema as well as a persisting pneumothorax - bedside bronchoscopy revealed significant material in the airway -TPA infused via chest tube today  Acute transient atrial fibrillation with RVR No known prior history of atrial fibrillation -currently in NSR -unable to anticoagulate due to recurrent GI bleeding -continue to follow on telemetry  MSSA bacteremia - septic shock - hypotension Cefazolin continues -appears to be stabilizing hemodynamically  Acute renal failure CRRT dependent - Nephrology following  BPH  DVT prophylaxis: SCDs due to GIB  Code Status: FULL CODE Family Communication: per PCCM   Disposition Plan: ICU  Consultants:  PCCM Nephrology GI  Antimicrobials:  10/9 vancomycin > 10/10 10/9 cefepime > 10/10 10/10 ancef > 10/13 10/13 cefepime > 10/20 10/14 anidulofungin > 10/15 Cefazolin 10/20 >  Objective: Blood pressure 112/66, pulse 95, temperature 98.2 F (36.8 C), temperature source Core, resp. rate (!) 21, height _1  (1.778 m), weight 88.6 kg, SpO2 100 %.  Intake/Output Summary (Last 24 hours) at 08/14/2019 1512 Last data filed at 08/14/2019 1400 Gross per 24 hour  Intake 4566.47 ml  Output 4853 ml  Net -286.53 ml   Filed Weights   08/12/19 0500 08/13/19 0500 08/14/19 0500  Weight: 91.2 kg 90.8 kg 88.6 kg    Examination: General: Sedated on ventilator Lungs: Fine crackles throughout all fields -equal breath sounds bilateral fields with exception of the  right base with no appreciable air movement Cardiovascular: RRR without murmur appreciable Abdomen: Soft, BS positive, no appreciable mass Extremities: Trace lower extremity edema  CBC: Recent Labs  Lab 08/12/19 0500  08/13/19 0500 08/13/19 1030  08/14/19 0530  08/14/19 0724 08/14/19 1128 08/14/19 1134  WBC 8.3  --  9.2 11.9*  --  10.8*  --   --   --   --   NEUTROABS 6.7  --  8.0*  --   --  10.1*  --   --   --   --   HGB 7.5*   < > 6.6* 6.8*   < > 7.9*   < > 8.2* 7.1* 8.5*  HCT 24.2*   < > 21.7* 22.1*   < > 25.4*   < > 24.0* 21.0* 25.0*  MCV 99.2  --  101.4* 101.8*  --  98.4  --   --   --   --   PLT 119*  --  118* 122*  --  130*  --   --   --   --    < > = values in this interval not displayed.   Basic Metabolic Panel: Recent Labs  Lab 08/12/19 0500  08/13/19 0500 08/13/19 1740  08/14/19 0530  08/14/19 0724 08/14/19 1128 08/14/19 1134  NA 140  140   < > 135  134* 137   < > 136   < > 136 140 139  K 4.0  4.0   < > 4.5  4.5 5.1   < > 4.6   < > 4.7 4.0 4.3  CL 95*  94*   < > 97*  96* 101  --  95*  --   --   --   --   CO2 33*  32   < > _0 --  27  --   --   --   --   GLUCOSE 215*  216*   < > 113*  112* 119*  --  237*  --   --   --   --   BUN 45*  46*   < > 44*  43* 48*  --  49*  --   --   --   --   CREATININE 1.89*  1.91*   < > 1.86*  1.79* 2.48*  --  2.39*  --   --   --   --   CALCIUM 7.3*  7.3*   < > 7.9*  7.9* 8.0*  --  7.6*  --   --   --   --   MG 2.1  --  2.4  --   --  2.3  --   --   --   --   PHOS 2.6   < > 3.4 6.5*  --  5.6*  --   --   --   --    < > = values in this interval not displayed.   GFR: Estimated Creatinine Clearance: 34.1 mL/min (A) (by C-G formula based on SCr of 2.39 mg/dL (H)).  Liver Function Tests: Recent Labs  Lab 08/11/19 0500  08/12/19 0500 08/12/19 1600 08/13/19 0500 08/13/19 1740 08/14/19  0530  AST 37  --  32  --  24  --  25  ALT 10  --  9  --  9  --  8  ALKPHOS 65  --  63  --  52  --  61  BILITOT 0.4  --  0.5   --  0.5  --  0.8  PROT 5.9*  --  5.9*  --  6.2*  --  6.2*  ALBUMIN 2.2*  2.3*   < > 2.1*  2.1* 2.6* 2.8*  2.8* 2.6* 2.4*  2.4*   < > = values in this interval not displayed.    HbA1C: Hgb A1c MFr Bld  Date/Time Value Ref Range Status  07/26/2019 05:00 AM 6.1 (H) 4.8 - 5.6 % Final    Comment:    (NOTE) Pre diabetes:          5.7%-6.4% Diabetes:              >6.4% Glycemic control for   <7.0% adults with diabetes     CBG: Recent Labs  Lab 08/13/19 2033 08/14/19 0039 08/14/19 0518 08/14/19 0723 08/14/19 1127  GLUCAP 168* 248* 254* 231* 172*    Recent Results (from the past 240 hour(s))  Culture, blood (routine x 2)     Status: None   Collection Time: 08/05/19  5:00 AM   Specimen: BLOOD RIGHT HAND  Result Value Ref Range Status   Specimen Description   Final    BLOOD RIGHT HAND Performed at [facility], [place] [address] [place], [place] [address]    Special Requests   Final    BOTTLES DRAWN AEROBIC ONLY Blood Culture adequate volume Performed at [place] [address], [place], [place] [address]    Culture   Final    NO GROWTH 5 DAYS Performed at [facility], [place] [address], [place], [place] [address]    Report Status 08/10/2019 FINAL  Final  Culture, blood (routine x 2)     Status: None   Collection Time: 08/05/19  5:05 AM   Specimen: BLOOD LEFT HAND  Result Value Ref Range Status   Specimen Description   Final    BLOOD LEFT HAND Performed at [facility] [address] [place], [place] [address]    Special Requests   Final    BOTTLES DRAWN AEROBIC ONLY Blood Culture adequate volume Performed at [facility] [address], [place], [place] [address]    Culture   Final    NO GROWTH 5 DAYS Performed at [facility], [place] [address] [place], [place] [address]    Report Status 08/10/2019 FINAL  Final  Culture, respiratory (non-expectorated)     Status: None  (Preliminary result)   Collection Time: 08/13/19 11:30 AM   Specimen: Bronchoalveolar Lavage; Respiratory  Result Value Ref Range Status   Specimen Description   Final    BRONCHIAL ALVEOLAR LAVAGE Performed at [facility], [address], [place], [place] [address]    Special Requests   Final    Normal Performed at [facility] [address] [place], [place] [address]    Gram Stain   Final    MODERATE WBC PRESENT,BOTH PMN AND MONONUCLEAR NO ORGANISMS SEEN    Culture   Final    CULTURE REINCUBATED FOR BETTER GROWTH Performed at [facility], [place] [address] [place],  [address]    Report  Status PENDING  Incomplete     Scheduled Meds: . sodium chloride   Intravenous Once  . chlorhexidine  15 mL Mouth/Throat BID  . Chlorhexidine Gluconate Cloth  6 each Topical Daily  . feeding supplement (PRO-STAT SUGAR FREE 64)  60 mL Per Tube TID  . hydrocortisone sodium succinate  50 mg Intravenous Q12H  . insulin aspart  0-9 Units Subcutaneous Q4H  . mouth rinse  15 mL Mouth Rinse 10 times per day  . pantoprazole (PROTONIX) IV  40 mg Intravenous Q12H  . polyethylene glycol  17 g Per Tube BID  . sennosides  5 mL Per Tube BID  . sodium chloride HYPERTONIC  4 mL Nebulization BID  . sucralfate  1 g Oral Q6H  . tamsulosin  0.4 mg Oral BID  . voriconazole  400 mg Per Tube Q12H   Followed by  . [START ON 08/15/2019] voriconazole  200 mg Per Tube Q12H   Continuous Infusions: .  prismasol BGK 4/2.5 200 mL/hr at 08/14/19 0200  . sodium chloride 10 mL/hr at 08/09/19 1000  . calcium gluconate infusion for CRRT 20 g (08/14/19 1446)  .  ceFAZolin (ANCEF) IV Stopped (08/14/19 1356)  . feeding supplement (PIVOT 1.5 CAL) Stopped (08/14/19 1200)  . fentaNYL infusion INTRAVENOUS 30 mcg/hr (08/14/19 1400)  . norepinephrine (LEVOPHED) Adult infusion 5 mcg/min (08/14/19 1400)  . prismasol B22GK 4/0 1,800 mL/hr at 08/14/19 1242  . sodium citrate 2 %/dextrose 2.5%  solution 3000 mL 250 mL/hr at 08/14/19 1311     LOS: 20 days   Cherene Altes, MD [facility] Office  [phone] Pager - Text Page per Amion  If 7PM-7AM, please contact night-coverage per Amion 08/14/2019, 3:12 PM

## 2019-08-14 NOTE — Progress Notes (Signed)
RT Note:  Endobronchial blocker secured @ 18.

## 2019-08-15 ENCOUNTER — Inpatient Hospital Stay (HOSPITAL_COMMUNITY): Payer: Medicare HMO

## 2019-08-15 DIAGNOSIS — R0602 Shortness of breath: Secondary | ICD-10-CM | POA: Diagnosis not present

## 2019-08-15 DIAGNOSIS — R579 Shock, unspecified: Secondary | ICD-10-CM | POA: Diagnosis not present

## 2019-08-15 DIAGNOSIS — A4901 Methicillin susceptible Staphylococcus aureus infection, unspecified site: Secondary | ICD-10-CM | POA: Diagnosis not present

## 2019-08-15 DIAGNOSIS — E872 Acidosis: Secondary | ICD-10-CM | POA: Diagnosis not present

## 2019-08-15 DIAGNOSIS — N179 Acute kidney failure, unspecified: Secondary | ICD-10-CM | POA: Diagnosis not present

## 2019-08-15 DIAGNOSIS — D649 Anemia, unspecified: Secondary | ICD-10-CM | POA: Diagnosis not present

## 2019-08-15 DIAGNOSIS — J1289 Other viral pneumonia: Secondary | ICD-10-CM | POA: Diagnosis not present

## 2019-08-15 DIAGNOSIS — Z4682 Encounter for fitting and adjustment of non-vascular catheter: Secondary | ICD-10-CM | POA: Diagnosis not present

## 2019-08-15 DIAGNOSIS — J9601 Acute respiratory failure with hypoxia: Secondary | ICD-10-CM | POA: Diagnosis not present

## 2019-08-15 DIAGNOSIS — R918 Other nonspecific abnormal finding of lung field: Secondary | ICD-10-CM | POA: Diagnosis not present

## 2019-08-15 DIAGNOSIS — I4891 Unspecified atrial fibrillation: Secondary | ICD-10-CM | POA: Diagnosis not present

## 2019-08-15 DIAGNOSIS — U071 COVID-19: Secondary | ICD-10-CM | POA: Diagnosis not present

## 2019-08-15 DIAGNOSIS — J9600 Acute respiratory failure, unspecified whether with hypoxia or hypercapnia: Secondary | ICD-10-CM | POA: Diagnosis not present

## 2019-08-15 DIAGNOSIS — I959 Hypotension, unspecified: Secondary | ICD-10-CM | POA: Diagnosis not present

## 2019-08-15 LAB — POCT I-STAT EG7
Acid-Base Excess: 7 mmol/L — ABNORMAL HIGH (ref 0.0–2.0)
Acid-Base Excess: 8 mmol/L — ABNORMAL HIGH (ref 0.0–2.0)
Acid-Base Excess: 11 mmol/L — ABNORMAL HIGH (ref 0.0–2.0)
Acid-Base Excess: 11 mmol/L — ABNORMAL HIGH (ref 0.0–2.0)
Bicarbonate: 33 mmol/L — ABNORMAL HIGH (ref 20.0–28.0)
Bicarbonate: 33.2 mmol/L — ABNORMAL HIGH (ref 20.0–28.0)
Bicarbonate: 35.2 mmol/L — ABNORMAL HIGH (ref 20.0–28.0)
Bicarbonate: 34.1 mmol/L — ABNORMAL HIGH (ref 20.0–28.0)
Calcium, Ion: 0.41 mmol/L — CL (ref 1.15–1.40)
Calcium, Ion: 0.38 mmol/L — CL (ref 1.15–1.40)
Calcium, Ion: 0.51 mmol/L — CL (ref 1.15–1.40)
Calcium, Ion: 0.37 mmol/L — CL (ref 1.15–1.40)
HCT: 25 % — ABNORMAL LOW (ref 39.0–52.0)
HCT: 25 % — ABNORMAL LOW (ref 39.0–52.0)
HCT: 29 % — ABNORMAL LOW (ref 39.0–52.0)
HCT: 30 % — ABNORMAL LOW (ref 39.0–52.0)
Hemoglobin: 8.5 g/dL — ABNORMAL LOW (ref 13.0–17.0)
Hemoglobin: 8.5 g/dL — ABNORMAL LOW (ref 13.0–17.0)
Hemoglobin: 9.9 g/dL — ABNORMAL LOW (ref 13.0–17.0)
Hemoglobin: 10.2 g/dL — ABNORMAL LOW (ref 13.0–17.0)
O2 Saturation: 67 %
O2 Saturation: 62 %
O2 Saturation: 76 %
O2 Saturation: 67 %
Patient temperature: 36.5
Patient temperature: 37
Patient temperature: 37.3
Potassium: 3.9 mmol/L (ref 3.5–5.1)
Potassium: 3.7 mmol/L (ref 3.5–5.1)
Potassium: 3.9 mmol/L (ref 3.5–5.1)
Potassium: 3.8 mmol/L (ref 3.5–5.1)
Sodium: 139 mmol/L (ref 135–145)
Sodium: 140 mmol/L (ref 135–145)
Sodium: 139 mmol/L (ref 135–145)
Sodium: 139 mmol/L (ref 135–145)
TCO2: 35 mmol/L — ABNORMAL HIGH (ref 22–32)
TCO2: 35 mmol/L — ABNORMAL HIGH (ref 22–32)
TCO2: 37 mmol/L — ABNORMAL HIGH (ref 22–32)
TCO2: 35 mmol/L — ABNORMAL HIGH (ref 22–32)
pCO2, Ven: 52 mmHg (ref 44.0–60.0)
pCO2, Ven: 48.6 mmHg (ref 44.0–60.0)
pCO2, Ven: 45.3 mmHg (ref 44.0–60.0)
pCO2, Ven: 40.2 mmHg — ABNORMAL LOW (ref 44.0–60.0)
pH, Ven: 7.407 (ref 7.250–7.430)
pH, Ven: 7.443 — ABNORMAL HIGH (ref 7.250–7.430)
pH, Ven: 7.499 — ABNORMAL HIGH (ref 7.250–7.430)
pH, Ven: 7.538 — ABNORMAL HIGH (ref 7.250–7.430)
pO2, Ven: 35 mmHg (ref 32.0–45.0)
pO2, Ven: 31 mmHg — CL (ref 32.0–45.0)
pO2, Ven: 38 mmHg (ref 32.0–45.0)
pO2, Ven: 31 mmHg — CL (ref 32.0–45.0)

## 2019-08-15 LAB — HEPATIC FUNCTION PANEL
ALT: 6 U/L (ref 0–44)
AST: 29 U/L (ref 15–41)
Albumin: 2.2 g/dL — ABNORMAL LOW (ref 3.5–5.0)
Alkaline Phosphatase: 59 U/L (ref 38–126)
Bilirubin, Direct: <0.1 mg/dL (ref 0.0–0.2)
Total Bilirubin: 0.3 mg/dL (ref 0.3–1.2)
Total Protein: 6 g/dL — ABNORMAL LOW (ref 6.5–8.1)

## 2019-08-15 LAB — CBC WITH DIFFERENTIAL/PLATELET
Abs Immature Granulocytes: 0.13 10*3/uL — ABNORMAL HIGH (ref 0.00–0.07)
Basophils Absolute: 0 10*3/uL (ref 0.0–0.1)
Basophils Relative: 0 %
Eosinophils Absolute: 0 10*3/uL (ref 0.0–0.5)
Eosinophils Relative: 0 %
HCT: 23.1 % — ABNORMAL LOW (ref 39.0–52.0)
Hemoglobin: 7.3 g/dL — ABNORMAL LOW (ref 13.0–17.0)
Immature Granulocytes: 2 %
Lymphocytes Relative: 2 %
Lymphs Abs: 0.2 10*3/uL — ABNORMAL LOW (ref 0.7–4.0)
MCH: 30.4 pg (ref 26.0–34.0)
MCHC: 31.6 g/dL (ref 30.0–36.0)
MCV: 96.3 fL (ref 80.0–100.0)
Monocytes Absolute: 0.3 10*3/uL (ref 0.1–1.0)
Monocytes Relative: 3 %
Neutro Abs: 6.9 10*3/uL (ref 1.7–7.7)
Neutrophils Relative %: 93 %
Platelets: 122 10*3/uL — ABNORMAL LOW (ref 150–400)
RBC: 2.4 MIL/uL — ABNORMAL LOW (ref 4.22–5.81)
RDW: 16.5 % — ABNORMAL HIGH (ref 11.5–15.5)
WBC: 7.5 10*3/uL (ref 4.0–10.5)
nRBC: 1.5 % — ABNORMAL HIGH (ref 0.0–0.2)

## 2019-08-15 LAB — RENAL FUNCTION PANEL
Albumin: 2.2 g/dL — ABNORMAL LOW (ref 3.5–5.0)
Albumin: 2.4 g/dL — ABNORMAL LOW (ref 3.5–5.0)
Anion gap: 13 (ref 5–15)
Anion gap: 12 (ref 5–15)
BUN: 45 mg/dL — ABNORMAL HIGH (ref 8–23)
BUN: 38 mg/dL — ABNORMAL HIGH (ref 8–23)
CO2: 34 mmol/L — ABNORMAL HIGH (ref 22–32)
CO2: 33 mmol/L — ABNORMAL HIGH (ref 22–32)
Calcium: 7.7 mg/dL — ABNORMAL LOW (ref 8.9–10.3)
Calcium: 7.8 mg/dL — ABNORMAL LOW (ref 8.9–10.3)
Chloride: 92 mmol/L — ABNORMAL LOW (ref 98–111)
Chloride: 94 mmol/L — ABNORMAL LOW (ref 98–111)
Creatinine, Ser: 2.19 mg/dL — ABNORMAL HIGH (ref 0.61–1.24)
Creatinine, Ser: 1.96 mg/dL — ABNORMAL HIGH (ref 0.61–1.24)
GFR calc Af Amer: 35 mL/min — ABNORMAL LOW (ref >60)
GFR calc Af Amer: 40 mL/min — ABNORMAL LOW (ref >60)
GFR calc non Af Amer: 30 mL/min — ABNORMAL LOW (ref >60)
GFR calc non Af Amer: 35 mL/min — ABNORMAL LOW (ref >60)
Glucose, Bld: 147 mg/dL — ABNORMAL HIGH (ref 70–99)
Glucose, Bld: 144 mg/dL — ABNORMAL HIGH (ref 70–99)
Phosphorus: 3.3 mg/dL (ref 2.5–4.6)
Phosphorus: 3.1 mg/dL (ref 2.5–4.6)
Potassium: 3.9 mmol/L (ref 3.5–5.1)
Potassium: 4 mmol/L (ref 3.5–5.1)
Sodium: 139 mmol/L (ref 135–145)
Sodium: 139 mmol/L (ref 135–145)

## 2019-08-15 LAB — CBC
HCT: 26.9 % — ABNORMAL LOW (ref 39.0–52.0)
Hemoglobin: 8.6 g/dL — ABNORMAL LOW (ref 13.0–17.0)
MCH: 30.6 pg (ref 26.0–34.0)
MCHC: 32 g/dL (ref 30.0–36.0)
MCV: 95.7 fL (ref 80.0–100.0)
Platelets: 149 10*3/uL — ABNORMAL LOW (ref 150–400)
RBC: 2.81 MIL/uL — ABNORMAL LOW (ref 4.22–5.81)
RDW: 16.4 % — ABNORMAL HIGH (ref 11.5–15.5)
WBC: 10.2 10*3/uL (ref 4.0–10.5)
nRBC: 1.5 % — ABNORMAL HIGH (ref 0.0–0.2)

## 2019-08-15 LAB — RETICULOCYTES
Immature Retic Fract: 24.1 % — ABNORMAL HIGH (ref 2.3–15.9)
RBC.: 2.81 MIL/uL — ABNORMAL LOW (ref 4.22–5.81)
Retic Count, Absolute: 157.1 10*3/uL (ref 19.0–186.0)
Retic Ct Pct: 5.6 % — ABNORMAL HIGH (ref 0.4–3.1)

## 2019-08-15 LAB — POCT I-STAT 7, (LYTES, BLD GAS, ICA,H+H)
Acid-Base Excess: 10 mmol/L — ABNORMAL HIGH (ref 0.0–2.0)
Acid-Base Excess: 3 mmol/L — ABNORMAL HIGH (ref 0.0–2.0)
Acid-Base Excess: 15 mmol/L — ABNORMAL HIGH (ref 0.0–2.0)
Bicarbonate: 34.8 mmol/L — ABNORMAL HIGH (ref 20.0–28.0)
Bicarbonate: 26.5 mmol/L (ref 20.0–28.0)
Bicarbonate: 38.1 mmol/L — ABNORMAL HIGH (ref 20.0–28.0)
Calcium, Ion: 0.95 mmol/L — ABNORMAL LOW (ref 1.15–1.40)
Calcium, Ion: 0.81 mmol/L — CL (ref 1.15–1.40)
Calcium, Ion: 1.01 mmol/L — ABNORMAL LOW (ref 1.15–1.40)
HCT: 23 % — ABNORMAL LOW (ref 39.0–52.0)
HCT: 18 % — ABNORMAL LOW (ref 39.0–52.0)
HCT: 27 % — ABNORMAL LOW (ref 39.0–52.0)
Hemoglobin: 7.8 g/dL — ABNORMAL LOW (ref 13.0–17.0)
Hemoglobin: 6.1 g/dL — CL (ref 13.0–17.0)
Hemoglobin: 9.2 g/dL — ABNORMAL LOW (ref 13.0–17.0)
O2 Saturation: 88 %
O2 Saturation: 91 %
O2 Saturation: 92 %
Patient temperature: 36.5
Patient temperature: 37
Potassium: 3.9 mmol/L (ref 3.5–5.1)
Potassium: 2.9 mmol/L — ABNORMAL LOW (ref 3.5–5.1)
Potassium: 4 mmol/L (ref 3.5–5.1)
Sodium: 138 mmol/L (ref 135–145)
Sodium: 144 mmol/L (ref 135–145)
Sodium: 137 mmol/L (ref 135–145)
TCO2: 36 mmol/L — ABNORMAL HIGH (ref 22–32)
TCO2: 28 mmol/L (ref 22–32)
TCO2: 39 mmol/L — ABNORMAL HIGH (ref 22–32)
pCO2 arterial: 50.4 mmHg — ABNORMAL HIGH (ref 32.0–48.0)
pCO2 arterial: 33.1 mmHg (ref 32.0–48.0)
pCO2 arterial: 41.6 mmHg (ref 32.0–48.0)
pH, Arterial: 7.445 (ref 7.350–7.450)
pH, Arterial: 7.512 — ABNORMAL HIGH (ref 7.350–7.450)
pH, Arterial: 7.57 — ABNORMAL HIGH (ref 7.350–7.450)
pO2, Arterial: 52 mmHg — ABNORMAL LOW (ref 83.0–108.0)
pO2, Arterial: 54 mmHg — ABNORMAL LOW (ref 83.0–108.0)
pO2, Arterial: 55 mmHg — ABNORMAL LOW (ref 83.0–108.0)

## 2019-08-15 LAB — GLUCOSE, CAPILLARY
Glucose-Capillary: 133 mg/dL — ABNORMAL HIGH (ref 70–99)
Glucose-Capillary: 153 mg/dL — ABNORMAL HIGH (ref 70–99)
Glucose-Capillary: 174 mg/dL — ABNORMAL HIGH (ref 70–99)
Glucose-Capillary: 182 mg/dL — ABNORMAL HIGH (ref 70–99)
Glucose-Capillary: 193 mg/dL — ABNORMAL HIGH (ref 70–99)
Glucose-Capillary: 200 mg/dL — ABNORMAL HIGH (ref 70–99)

## 2019-08-15 LAB — CULTURE, RESPIRATORY W GRAM STAIN: Special Requests: NORMAL

## 2019-08-15 LAB — ASPERGILLUS ANTIGEN, BAL/SERUM: Aspergillus Ag, BAL/Serum: 0.06 Index (ref 0.00–0.49)

## 2019-08-15 LAB — D-DIMER, QUANTITATIVE: D-Dimer, Quant: 8.83 ug/mL-FEU — ABNORMAL HIGH (ref 0.00–0.50)

## 2019-08-15 LAB — MAGNESIUM: Magnesium: 2.1 mg/dL (ref 1.7–2.4)

## 2019-08-15 LAB — CALCIUM, IONIZED: Calcium, Ionized, Serum: 3.9 mg/dL — ABNORMAL LOW (ref 4.5–5.6)

## 2019-08-15 LAB — C-REACTIVE PROTEIN: CRP: 10.5 mg/dL — ABNORMAL HIGH (ref <1.0)

## 2019-08-15 MED ORDER — SODIUM CHLORIDE 0.9 % IV SOLN
1.0000 g | Freq: Two times a day (BID) | INTRAVENOUS | Status: DC
  Administered 2019-08-15 – 2019-08-20 (×10): 1 g via INTRAVENOUS
  Filled 2019-08-15 (×11): qty 1

## 2019-08-15 MED ORDER — METOPROLOL TARTRATE 5 MG/5ML IV SOLN
5.0000 mg | Freq: Four times a day (QID) | INTRAVENOUS | Status: DC | PRN
  Administered 2019-08-15 (×2): 2.5 mg via INTRAVENOUS
  Filled 2019-08-15: qty 5

## 2019-08-15 MED ORDER — SENNOSIDES 8.8 MG/5ML PO SYRP
5.0000 mL | ORAL_SOLUTION | Freq: Every day | ORAL | Status: DC
  Administered 2019-08-19: 5 mL
  Filled 2019-08-15: qty 5

## 2019-08-15 MED ORDER — METOPROLOL TARTRATE 5 MG/5ML IV SOLN
5.0000 mg | INTRAVENOUS | Status: DC | PRN
  Administered 2019-08-15: 10 mg via INTRAVENOUS
  Administered 2019-08-16 – 2019-09-07 (×4): 5 mg via INTRAVENOUS
  Filled 2019-08-15 (×2): qty 10
  Filled 2019-08-15 (×2): qty 5
  Filled 2019-08-15: qty 10
  Filled 2019-08-15: qty 5

## 2019-08-15 MED ORDER — AMLODIPINE 1 MG/ML ORAL SUSPENSION: 10.0000 mg | Freq: Every day | ORAL | Status: DC

## 2019-08-15 MED ORDER — AMLODIPINE BESYLATE 5 MG PO TABS
10.0000 mg | ORAL_TABLET | Freq: Every day | ORAL | Status: DC
  Administered 2019-08-16: 13:00:00 10 mg
  Filled 2019-08-15 (×2): qty 2

## 2019-08-15 MED ORDER — HYDRALAZINE HCL 25 MG PO TABS
25.0000 mg | ORAL_TABLET | Freq: Four times a day (QID) | ORAL | Status: DC
  Administered 2019-08-15 – 2019-08-16 (×2): 25 mg
  Filled 2019-08-15 (×2): qty 1

## 2019-08-15 MED ORDER — POLYETHYLENE GLYCOL 3350 17 G PO PACK: 17.0000 g | PACK | Freq: Every day | ORAL | Status: DC

## 2019-08-15 MED ORDER — METOPROLOL TARTRATE 5 MG/5ML IV SOLN: 5.0000 mg | INTRAVENOUS | Status: DC | PRN

## 2019-08-15 MED ORDER — AMLODIPINE BESYLATE 5 MG PO TABS
5.0000 mg | ORAL_TABLET | Freq: Once | ORAL | Status: AC
  Administered 2019-08-15: 16:00:00 5 mg
  Filled 2019-08-15: qty 1

## 2019-08-15 MED ORDER — FENTANYL CITRATE (PF) 100 MCG/2ML IJ SOLN
25.0000 ug | INTRAMUSCULAR | Status: DC | PRN
  Administered 2019-08-15 – 2019-08-20 (×14): 100 ug via INTRAVENOUS
  Filled 2019-08-15 (×14): qty 2

## 2019-08-15 MED ORDER — PANCRELIPASE (LIP-PROT-AMYL) 10440-39150 UNITS PO TABS
20880.0000 [IU] | ORAL_TABLET | Freq: Once | ORAL | Status: AC
  Administered 2019-08-15: 20880 [IU]
  Filled 2019-08-15: qty 2

## 2019-08-15 MED ORDER — AMLODIPINE BESYLATE 5 MG PO TABS: 5.0000 mg | ORAL_TABLET | Freq: Once | ORAL | Status: DC

## 2019-08-15 MED ORDER — SODIUM BICARBONATE 650 MG PO TABS
650.0000 mg | ORAL_TABLET | Freq: Once | ORAL | Status: AC
  Administered 2019-08-15: 650 mg
  Filled 2019-08-15: qty 1

## 2019-08-15 MED ORDER — AMLODIPINE BESYLATE 5 MG PO TABS
5.0000 mg | ORAL_TABLET | Freq: Every day | ORAL | Status: DC
  Administered 2019-08-15: 5 mg
  Filled 2019-08-15: qty 1

## 2019-08-15 NOTE — Progress Notes (Signed)
[name]  QQI:[id number] DOB: 1951-08-13 DOA: 07/25/2019 PCP: Kathyrn Drown, MD    Brief Narrative:  68 year old with a history of GERD and BPH who presented to [facility], ED with S OB and was found to be hypoxic.  He was initially diagnosed with COVID-19 September 20.  1 to 2 days prior to his presentation he developed worsening shortness of breath with anorexia.  His wife's physician during a teleconference call noted that the patient himself is very tachypneic and ultimately EMS was sent to the house.  EMS found the patient to have saturations in the 40s.  In the ED on nonrebreather the patient sats only improved to the 80s.  The patient was severely confused and required emergent intubation.  Chest x-ray confirmed multifocal infiltrates.  CT chest was negative for PE  Significant Events: 10/3 admit to [facility] from [place] ED 10/13 right pneumothorax -pigtail chest tube placed 10/14 Nephrology consulted 10/15 begin CRRT -endobronchial blocker placed 10/16 melena 10/17 A. fib with RVR -EGD 10/18 transfuse PRBC 10/20 Heparin drip resumed 10/21 abrupt drop in hemoglobin -transfuse 1 unit PRBC 10/23 tPA per chest tube   COVID-19 specific Treatment: Decadron 10/2 > 10/12 Remdesivir 10/2 > 10/6 Actemra 10/2 Convalescent plasma 10/3  Subjective: Remains sedate/noncommunicative.  No evidence of respiratory distress or discomfort.  Assessment & Plan:  Covid pneumonia - acute hypoxic respiratory failure Has completed a course of decadron and remdesivir - was dosed with actemra and convalescent plasma - pulmonary status remains very complicated and tenuous - ventilator management per PCCM  Recent Labs  Lab 08/11/19 0500 08/12/19 0500 08/13/19 0500 08/14/19 0530 08/15/19 0500  DDIMER >20.00* 11.03* 5.27* 7.45* 8.83*  CRP 4.2* 5.5* 9.8* 15.8* 10.5*  ALT _0 GI bleed - gastric ulcer - acute blood loss anemia Status post EGD with clipping of vessel in large ulcer  - full anticoagulation was resumed 10/20 following which he experienced an abrupt drop in his hemoglobin - he was transfused 1 unit 10/21 - anticoagulation stopped 10/21 -unclear if wide variation in hemoglobin across 3 different checks today is accurate -recheck full CBC as a stat now  Recent Labs  Lab 08/15/19 0500 08/15/19 1033 08/15/19 1034  HGB 7.3* 8.5* 6.1*    Markedly elevated D-dimer signif increase noted 10/19 - unable to anticoag at present due to GIB - no evidence of DVT on dopplers 10/8 -likely related to suspected empyema right lung  Pneumothorax right - empyema CT chest 10/21 noted findings most consistent with an empyema as well as a persisting pneumothorax - bedside bronchoscopy revealed significant material in the airway -TPA infused via chest tube 10/23 -no significant pneumothorax on follow-up chest x-ray today -no airleak or respiratory variation appreciable in [place] chest tube container -chest tube management per PCCM  Acute transient atrial fibrillation with RVR No known prior history of atrial fibrillation -currently in NSR -unable to anticoagulate due to recurrent GI bleeding -continue to follow on telemetry  MSSA bacteremia - septic shock - hypotension abx tx continues -remains hemodynamically stable  Pseudomonas and Acinetobacter in BAL specimen Change abx to imipenem to cover   Acute renal failure CRRT dependent - Nephrology following  HTN BP climbing today - adding per tube and prn IV meds - follow trend   BPH  DVT prophylaxis: SCDs due to GIB  Code Status: FULL CODE Family Communication: per PCCM   Disposition Plan: ICU  Consultants:  PCCM Nephrology GI  Antimicrobials:  10/9 vancomycin > 10/10 10/9 cefepime > 10/10 10/10 ancef > 10/13 10/13 cefepime > 10/20 10/14 anidulofungin > 10/15 Cefazolin 10/20 > 10/23 Meropenem 10/23 >  Objective: Blood pressure 122/71, pulse 91, temperature 98.6 F (37 C), temperature source Core, resp.  rate (!) 24, height _0  (1.778 m), weight 88.7 kg, SpO2 100 %.  Intake/Output Summary (Last 24 hours) at 08/15/2019 1452 Last data filed at 08/15/2019 1400 Gross per 24 hour  Intake 1822.46 ml  Output 3802 ml  Net -1979.54 ml   Filed Weights   08/13/19 0500 08/14/19 0500 08/15/19 0500  Weight: 90.8 kg 88.6 kg 88.7 kg    Examination: General: Sedated on ventilator Lungs: No air movement right base -fine crackles throughout other fields Cardiovascular: RRR -no murmur Abdomen: Soft, BS positive, no appreciable mass Extremities: Trace edema bilateral lower extremities  CBC: Recent Labs  Lab 08/13/19 0500 08/13/19 1030  08/14/19 0530  08/15/19 0500 08/15/19 1033 08/15/19 1034  WBC 9.2 11.9*  --  10.8*  --  7.5  --   --   NEUTROABS 8.0*  --   --  10.1*  --  6.9  --   --   HGB 6.6* 6.8*   < > 7.9*   < > 7.3* 8.5* 6.1*  HCT 21.7* 22.1*   < > 25.4*   < > 23.1* 25.0* 18.0*  MCV 101.4* 101.8*  --  98.4  --  96.3  --   --   PLT 118* 122*  --  130*  --  122*  --   --    < > = values in this interval not displayed.   Basic Metabolic Panel: Recent Labs  Lab 08/13/19 0500  08/14/19 0530  08/14/19 1715  08/15/19 0500 08/15/19 1033 08/15/19 1034  NA 135  134*   < > 136   < > 138   < > 139 140 144  K 4.5  4.5   < > 4.6   < > 3.8   < > 3.9 3.7 2.9*  CL 97*  96*   < > 95*  --  96*  --  92*  --   --   CO2 27  27   < > 27  --  31  --  34*  --   --   GLUCOSE 113*  112*   < > 237*  --  196*  --  147*  --   --   BUN 44*  43*   < > 49*  --  53*  --  45*  --   --   CREATININE 1.86*  1.79*   < > 2.39*  --  2.28*  --  2.19*  --   --   CALCIUM 7.9*  7.9*   < > 7.6*  --  7.5*  --  7.7*  --   --   MG 2.4  --  2.3  --   --   --  2.1  --   --   PHOS 3.4   < > 5.6*  --  3.3  --  3.3  --   --    < > = values in this interval not displayed.   GFR: Estimated Creatinine Clearance: 37.2 mL/min (A) (by C-G formula based on SCr of 2.19 mg/dL (H)).  Liver Function Tests: Recent Labs   Lab 08/12/19 0500  08/13/19 0500 08/13/19 1740 08/14/19 0530 08/14/19 1715 08/15/19 0500  AST 32  --  24  --  25  --  29  ALT 9  --  9  --  8  --  6  ALKPHOS 63  --  52  --  61  --  59  BILITOT 0.5  --  0.5  --  0.8  --  0.3  PROT 5.9*  --  6.2*  --  6.2*  --  6.0*  ALBUMIN 2.1*  2.1*   < > 2.8*  2.8* 2.6* 2.4*  2.4* 2.2* 2.2*  2.2*   < > = values in this interval not displayed.    HbA1C: Hgb A1c MFr Bld  Date/Time Value Ref Range Status  07/26/2019 05:00 AM 6.1 (H) 4.8 - 5.6 % Final    Comment:    (NOTE) Pre diabetes:          5.7%-6.4% Diabetes:              >6.4% Glycemic control for   <7.0% adults with diabetes     CBG: Recent Labs  Lab 08/14/19 1933 08/15/19 0019 08/15/19 0439 08/15/19 0740 08/15/19 1159  GLUCAP 181* 153* 133* 193* 174*    Recent Results (from the past 240 hour(s))  Culture, respiratory (non-expectorated)     Status: None   Collection Time: 08/13/19 11:30 AM   Specimen: Bronchoalveolar Lavage; Respiratory  Result Value Ref Range Status   Specimen Description   Final    BRONCHIAL ALVEOLAR LAVAGE Performed at [facility], [address] [place], [place] [address]    Special Requests   Final    Normal Performed at [facility] [address] [place], [place] [address]    Gram Stain   Final    MODERATE WBC PRESENT,BOTH PMN AND MONONUCLEAR NO ORGANISMS SEEN Performed at [facility], [place] [address] [place], [place] [address]    Culture   Final    FEW PSEUDOMONAS AERUGINOSA FEW ACINETOBACTER CALCOACETICUS/BAUMANNII COMPLEX    Report Status 08/15/2019 FINAL  Final   Organism ID, Bacteria PSEUDOMONAS AERUGINOSA  Final   Organism ID, Bacteria ACINETOBACTER CALCOACETICUS/BAUMANNII COMPLEX  Final      Susceptibility   Acinetobacter calcoaceticus/baumannii complex - MIC*    CEFTAZIDIME 8 SENSITIVE Sensitive     CEFTRIAXONE 16 INTERMEDIATE Intermediate     CIPROFLOXACIN 0.5 SENSITIVE Sensitive      GENTAMICIN 4 SENSITIVE Sensitive     IMIPENEM <=0.25 SENSITIVE Sensitive     PIP/TAZO 16 SENSITIVE Sensitive     TRIMETH/SULFA <=20 SENSITIVE Sensitive     CEFEPIME 16 INTERMEDIATE Intermediate     AMPICILLIN/SULBACTAM <=2 SENSITIVE Sensitive     * FEW ACINETOBACTER CALCOACETICUS/BAUMANNII COMPLEX   Pseudomonas aeruginosa - MIC*    CEFTAZIDIME 4 SENSITIVE Sensitive     CIPROFLOXACIN <=0.25 SENSITIVE Sensitive     GENTAMICIN <=1 SENSITIVE Sensitive     IMIPENEM 1 SENSITIVE Sensitive     PIP/TAZO 8 SENSITIVE Sensitive     CEFEPIME <=1 SENSITIVE Sensitive     * FEW PSEUDOMONAS AERUGINOSA  Fungus Culture With Stain     Status: None   Collection Time: 08/13/19 11:30 AM  Result Value Ref Range Status   Fungus Stain Final report  Final    Comment: (NOTE) Performed At: [facility] [place], [place] [id number] Rush Farmer MD ZC:[id number]    Fungus (Mycology) Culture PENDING  Incomplete   Fungal Source BRONCHIAL ALVEOLAR LAVAGE  Corrected    Comment: Performed at [facility], [place] [address] [place], [place]  [address] CORRECTED ON 10/21 AT 1659: PREVIOUSLY REPORTED AS TRACHEAL ASPIRATE   Aspergillus Ag, BAL/Serum     Status: None   Collection Time: 08/13/19 11:30 AM  Result Value Ref Range Status   Aspergillus Ag, BAL/Serum 0.06 0.00 - 0.49 Index Final    Comment: (NOTE) Performed At: [facility] [address] [place], [place] [id number] Rush Farmer MD [id number] Performed At: [facility] [place], [place] [id number] Katina Degree MDPhD [id number]   Fungus Culture Result     Status: None   Collection Time: 08/13/19 11:30 AM  Result Value Ref Range Status   Result 1 Comment  Final    Comment: (NOTE) KOH/Calcofluor preparation:  no fungus observed. Performed At: [facility] [place], [place] [id number] Rush Farmer MD [id number]      Scheduled Meds: . amLODipine  5 mg Per Tube  Daily  . chlorhexidine  15 mL Mouth/Throat BID  . Chlorhexidine Gluconate Cloth  6 each Topical Daily  . feeding supplement (PRO-STAT SUGAR FREE 64)  60 mL Per Tube TID  . insulin aspart  0-9 Units Subcutaneous Q4H  . mouth rinse  15 mL Mouth Rinse 10 times per day  . pantoprazole (PROTONIX) IV  40 mg Intravenous Q12H  . polyethylene glycol  17 g Per Tube QHS  . sennosides  5 mL Per Tube QHS  . sodium chloride HYPERTONIC  4 mL Nebulization BID  . sucralfate  1 g Oral Q6H  . tamsulosin  0.4 mg Oral BID   Continuous Infusions: .  prismasol BGK 4/2.5 200 mL/hr at 08/14/19 1825  . sodium chloride 10 mL/hr at 08/09/19 1000  . calcium gluconate infusion for CRRT 20 g (08/15/19 1030)  .  ceFAZolin (ANCEF) IV 2 g (08/15/19 1308)  . feeding supplement (PIVOT 1.5 CAL) Stopped (08/15/19 0523)  . norepinephrine (LEVOPHED) Adult infusion Stopped (08/15/19 0900)  . prismasol B22GK 4/0 1,800 mL/hr at 08/15/19 1338  . sodium citrate 2 %/dextrose 2.5% solution 3000 mL 250 mL/hr at 08/15/19 1354     LOS: 21 days   Cherene Altes, MD [facility] Office  [phone] Pager - Text Page per Amion  If 7PM-7AM, please contact night-coverage per Amion 08/15/2019, 2:52 PM

## 2019-08-15 NOTE — Progress Notes (Signed)
Spoke with pt wife regarding latest updates. She was able to speak to him via speaker on phone to give her love from her and other family members. Emotional support provided.

## 2019-08-15 NOTE — Progress Notes (Signed)
[facility] ROUNDING NOTE   Subjective:   This is a 68 year old gentleman who was admitted with Covid pneumonia July 13, 2019 completed course of remdesivir convalescent plasma Decadron treated with vancomycin and cefepime from 08/01/2019 until 08/02/2019.  On 08/02/2019 patient developed an ileus.  His creatinine on 07/25/2019 was 1.5 which appears to be his baseline.  On 08/02/2019 started to increase.  On 08/02/2019 was found to have MSSA bacteremia and was started on Ancef.  His blood pressure has been marginal with drops in blood pressure to 70 mmHg.  His renal ultrasound showed no hydronephrosis on 08/07/2019. Pt had administration of intravenous contrast on 07/25/2019.  Levo gtt apparently off at this time.   Remains off of IV heparin due to poss GIB.   I/O even from yesterday. CVP 0- 5. - 1.1 L UF yesterday.   No sig UOP. Continues on CRRT. D#9  Labs reviewed. K 3.9, BUN 45.   Objective:  Vital signs in last 24 hours:  Temp:  [98 F (36.7 C)-98.6 F (37 C)] 98.6 F (37 C) (10/23 1600) Pulse Rate:  [86-102] 101 (10/23 1600) Resp:  [16-43] 29 (10/23 1600) BP: (92-170)/(56-86) 170/80 (10/23 1600) SpO2:  [96 %-100 %] 100 % (10/23 1600) Arterial Line BP: (90-195)/(43-65) 195/65 (10/23 1600) FiO2 (%):  [40 %] 40 % (10/23 1528) Weight:  [88.7 kg] 88.7 kg (10/23 0500)  Exam:   Seen in ICU, on vent, sedated   L IJ temp HD cath   No jvd   Chest cta bilat   Cor reg no RG    Abd soft obese    Ext 1-2+ UE edema , no LE edema     Neuro sedated    Assessment/ Plan:   Acute kidney injury: on CKD w/ baseline creat of 1.5. Admitted with Covid pneumonia complicated course with recent history of MSSA bacteremia.  Developed shock hypotension and required pressors.  No evidence of hydronephrosis on renal ultrasound. Suspected ATN. There was administration of contrast but this was early on the course of his illness on 07/25/2019. For metabolic acidosis was started on  CRRT 08/07/2019. This is D#9  Getting citrate local a/c protocol  Keeping even w/ CRRT  Anemia / sp GIB - as per primary team transfuse as necessary  COVID-19 pneumonia-  Treated with remdesivir, dexamethasone and convalescent plasma  Afib / RVR - new, IV amio  GIB - acute, sp EGD 10/17 w/ large gastric ulcer/ w visible vessel, Rx'd by clipping  VDRF/ R PTX w/ chest tube  Shock - weaning pressors down, BP's low 100's on levo gtt  MSSA bacteremia/ ? Fungal airway infectino - on IV Ancef and antifungals   LOS: 15 Julianny Milstein D Leronda Lewers @TODAY @8 :11 PM   Basic Metabolic Panel: Recent Labs  Lab 08/11/19 0500  08/12/19 0500  08/13/19 0500 08/13/19 1740  08/14/19 0530  08/14/19 1715  08/15/19 0221 08/15/19 0227 08/15/19 0500 08/15/19 1033 08/15/19 1034  NA 139  139   < > 140  140   < > 135  134* 137   < > 136   < > 138   < > 139 138 139 140 144  K 4.5  4.5   < > 4.0  4.0   < > 4.5  4.5 5.1   < > 4.6   < > 3.8   < > 3.9 3.9 3.9 3.7 2.9*  CL 93*  93*   < > 95*  94*   < > 97*  96* 101  --  95*  --  96*  --   --   --  92*  --   --   CO2 33*  34*   < > 33*  32   < > 27  27 24   --  27  --  31  --   --   --  34*  --   --   GLUCOSE 130*  129*   < > 215*  216*   < > 113*  112* 119*  --  237*  --  196*  --   --   --  147*  --   --   BUN 66*  66*   < > 45*  46*   < > 44*  43* 48*  --  49*  --  53*  --   --   --  45*  --   --   CREATININE 2.49*  2.50*   < > 1.89*  1.91*   < > 1.86*  1.79* 2.48*  --  2.39*  --  2.28*  --   --   --  2.19*  --   --   CALCIUM 9.3  9.3   < > 7.3*  7.3*   < > 7.9*  7.9* 8.0*  --  7.6*  --  7.5*  --   --   --  7.7*  --   --   MG 2.3  --  2.1  --  2.4  --   --  2.3  --   --   --   --   --  2.1  --   --   PHOS 5.0*  4.8*   < > 2.6   < > 3.4 6.5*  --  5.6*  --  3.3  --   --   --  3.3  --   --    < > = values in this interval not displayed.    Liver Function Tests: Recent Labs  Lab 08/11/19 0500  08/12/19 0500  08/13/19 0500  08/13/19 1740 08/14/19 0530 08/14/19 1715 08/15/19 0500  AST 37  --  32  --  24  --  25  --  29  ALT 10  --  9  --  9  --  8  --  6  ALKPHOS 65  --  63  --  52  --  61  --  59  BILITOT 0.4  --  0.5  --  0.5  --  0.8  --  0.3  PROT 5.9*  --  5.9*  --  6.2*  --  6.2*  --  6.0*  ALBUMIN 2.2*  2.3*   < > 2.1*  2.1*   < > 2.8*  2.8* 2.6* 2.4*  2.4* 2.2* 2.2*  2.2*   < > = values in this interval not displayed.   No results for input(s): LIPASE, AMYLASE in the last 168 hours. No results for input(s): AMMONIA in the last 168 hours.  CBC: Recent Labs  Lab 08/11/19 0500  08/12/19 0500  08/13/19 0500 08/13/19 1030  08/14/19 0530  08/15/19 0227 08/15/19 0500 08/15/19 1033 08/15/19 1034 08/15/19 1510  WBC 8.6  --  8.3  --  9.2 11.9*  --  10.8*  --   --  7.5  --   --  10.2  NEUTROABS 6.9  --  6.7  --  8.0*  --   --  10.1*  --   --  6.9  --   --   --   HGB 7.6*   < > 7.5*   < > 6.6* 6.8*   < > 7.9*   < > 7.8* 7.3* 8.5* 6.1* 8.6*  HCT 23.8*   < > 24.2*   < > 21.7* 22.1*   < > 25.4*   < > 23.0* 23.1* 25.0* 18.0* 26.9*  MCV 97.9  --  99.2  --  101.4* 101.8*  --  98.4  --   --  96.3  --   --  95.7  PLT 114*  --  119*  --  118* 122*  --  130*  --   --  122*  --   --  149*   < > = values in this interval not displayed.    Cardiac Enzymes: No results for input(s): CKTOTAL, CKMB, CKMBINDEX, TROPONINI in the last 168 hours.  BNP: Invalid input(s): POCBNP  CBG: Recent Labs  Lab 08/15/19 0019 08/15/19 0439 08/15/19 0740 08/15/19 1159 08/15/19 1537  GLUCAP 153* 133* 193* 174* 182*     Medications:   .  prismasol BGK 4/2.5 200 mL/hr at 08/14/19 1825  . sodium chloride 10 mL/hr at 08/15/19 1614  . calcium gluconate infusion for CRRT 20 g (08/15/19 1030)  . feeding supplement (PIVOT 1.5 CAL) 1,000 mL (08/15/19 1552)  . meropenem (MERREM) IV 1 g (08/15/19 1633)  . norepinephrine (LEVOPHED) Adult infusion Stopped (08/15/19 0900)  . prismasol B22GK 4/0 1,800 mL/hr at 08/15/19  1630  . sodium citrate 2 %/dextrose 2.5% solution 3000 mL 250 mL/hr at 08/15/19 1354   . [START ON 08/16/2019] amLODipine  10 mg Per Tube Daily  . chlorhexidine  15 mL Mouth/Throat BID  . Chlorhexidine Gluconate Cloth  6 each Topical Daily  . feeding supplement (PRO-STAT SUGAR FREE 64)  60 mL Per Tube TID  . hydrALAZINE  25 mg Per Tube Q6H  . insulin aspart  0-9 Units Subcutaneous Q4H  . mouth rinse  15 mL Mouth Rinse 10 times per day  . pantoprazole (PROTONIX) IV  40 mg Intravenous Q12H  . polyethylene glycol  17 g Per Tube QHS  . sennosides  5 mL Per Tube QHS  . sodium chloride HYPERTONIC  4 mL Nebulization BID  . sucralfate  1 g Oral Q6H  . tamsulosin  0.4 mg Oral BID   Place/Maintain arterial line **AND** sodium chloride, acetaminophen (TYLENOL) oral liquid 160 mg/5 mL, acetaminophen, fentaNYL (SUBLIMAZE) injection, heparin, influenza vaccine adjuvanted, metoprolol tartrate, midazolam, pneumococcal 23 valent vaccine, sodium chloride

## 2019-08-15 NOTE — Progress Notes (Signed)
Pharmacy Antibiotic Note  [name] is a 68 y.o. male admitted on 07/25/2019 with COVID-19.  He has been treated with Cefepime for pseudomonal PNA with possible empyema and continues on cefazolin for MSSA bacteremia. Patient underwent bronchoscopy 10/22 which found large thick, dark, dry secretions nearly occluding trachea. Pharmacy has been consulted to change cefazolin to Meropenem for pseudomonas and acinetobacter from respiratory culture.  CRRT continues without interruption.  Plan: Meropenem 1g IV q12h D/c Cefazolin - f/u plans to treat MSSA bacteremia through 11/23 per ID. Follow up renal function, cultures, clinical status.  Height: _0  (177.8 cm) Weight: 195 lb 8.8 oz (88.7 kg) IBW/kg (Calculated) : 73  Temp (24hrs), Avg:98.5 F (36.9 C), Min:98 F (36.7 C), Max:99.5 F (37.5 C)  Recent Labs  Lab 08/12/19 0500  08/13/19 0500 08/13/19 1030 08/13/19 1740 08/14/19 0530 08/14/19 1715 08/15/19 0500  WBC 8.3  --  9.2 11.9*  --  10.8*  --  7.5  CREATININE 1.89*  1.91*   < > 1.86*  1.79*  --  2.48* 2.39* 2.28* 2.19*   < > = values in this interval not displayed.    Estimated Creatinine Clearance: 37.2 mL/min (A) (by C-G formula based on SCr of 2.19 mg/dL (H)).    No Known Allergies  Antimicrobials this admission: 10/3 Actemra x 1  10/3 remdesivir >> 10/6 10/3 cPlasma 10/7 cPlasma not given 10/9 Vancomycin >> 10/10 10/9 Cefepime >> 10/10; 10/14 >> 10/20 10/10 Cefazolin >>10/14; 10/21 >> 10/23 10/14 anidulafungin >> 10/16; 10/21 x1 10/22 Voriconazole >>10/23 10/23 Meropenem >>   Microbiology results: 10/2 BCx: no growth final  10/3 MRSA PCR: negative 10/9 UCx:<10k insig growth 10/9 BCx:4/4 bottles SSA BCID = MSSA 10/9tracheal aspirate: MSSA (pansens), rare pseudomonas (pansens) 10/9 MRSA [id number] 10/13 Bcx: negF  10/21 BAL:  few Pseudomonas aeruginosa, few Acinetobacter calcoaceticus/baumannii complex.  Aspergillus Ag: neg   Thank you for  allowing pharmacy to be a part of this patient's care.  Gretta Arab PharmD, BCPS Clinical pharmacist phone 7am- 5pm: [phone] 08/15/2019 3:32 PM

## 2019-08-15 NOTE — Progress Notes (Signed)
NAME:  [name], MRN:  [id number], DOB:  04/04/1951, LOS: 21 ADMISSION DATE:  07/25/2019, CONSULTATION DATE:  10/3 REFERRING MD:  Nadara Mustard, CHIEF COMPLAINT:  Dyspnea   Brief History   68 yo male dx with COVID 07/22/19 presented to [facility] ER 07/25/19 with progressive dyspnea and hypoxia requiring intubation from COVID 19 pneumonia.  Past Medical History  GERD  Significant Hospital Events   10/03 Admit to [facility] from [facility] ER, start decadron and remdesivir, given tociluzimab; prone positioning 10/04 convalescent plasma 10/05 stop prone positioning 10/06 convalescent plasma; prone positioning again 10/09 start ABx 10/10 MSSA bacteremia, CVL d/ced; ID consulted; increase OG tube outpt 10/11 vent weaning trial started 10/13 fever, pneumothorax, pig tail chest tube placed 10/14 worsening hypotension and renal fx >> consulted nephrology; worsening PTX >> replaced chest tube; GNR in sputum >> ABx changed 10/15 start CRRT; endobronchial blocker placed for persistent air leak 10/16 endobronchial blocker repositioned, changed chest tube to water seal; melana with ABLA >> GI consulted; transfuse PRBC 10/17 persistent air leak, increased WOB; start nimbex gtt >> air leak decreased; EGD; A fib with RVR >> start amiodarone 10/18 transfuse PRBC; GI s/o 10/20 resume heparin gtt 10/21 stopped nimbex, chest tube to suction, stopped heparin drip because Hgb dropped again 10/22 TPA in chest tube, repositioned endobronchial blocker  Consults:  ID Nephrology Gastroenterology PCCM  Procedures:  10/2 ETT>  10/2 R IJ CVL > 10/10 10/13 Rt pig tail chest tube >> 10/14 10/14 R 11F chest tube >  10/14 R subclavian CVL >  10/14 L IJ HD cath >  10/14 Lt radial a line >   Significant Diagnostic Tests:  10/2 CT angiogram chest > extensive bilateral airspace disease predominantly posterior 10/8 doppler legs b/l > no DVT 10/15 renal u/s > normal 10/17 EGD > non bleeding gastric ulcer  10/19 Echo >> EF 60  to 65%, hyperdynamic LV with small LVOT gradient 10/22 CT chest > large collection of air in pleural space with fluid, pneumonia bilaterally R>L endobronchial blocker in place, chest tube anterior  Micro Data:  9/20 SARS COV 2 > POSITIVE 10/2 blood > negative 10/9 blood > MSSA 2/4 10/9 resp > MSSA, Pseudomonas 10/13 blood >> negative 10/21 bronch wash bacterial >  10/21 bronch wash fungal >  10/21 bronch wash aspergillus antigen> negative  Antimicrobials:  10/3 remdesivir > 10/6 10/3 actemra 10/3 decadron > 10/12 10/4 convalescent plasma 10/6 convalescent plasma  10/9 vancomycin > 10/10 10/9 cefepime > 10/10 10/10 ancef > 10/13 10/13 cefepime > 10/20 10/14 anidulofungin > 10/15  10/20 ancef >   10/21 anidulofungin> 10/22 10/22 voriconazole > 10/23  Interim history/subjective:   No air leak Off vasopressors Weak Will grimace to pain  Objective   Blood pressure 113/70, pulse 100, temperature 98.6 F (37 C), temperature source Core, resp. rate (!) 24, height 5\' 10"  (1.778 m), weight 88.7 kg, SpO2 100 %. CVP:  [1 mmHg-7 mmHg] 2 mmHg  Vent Mode: PRVC FiO2 (%):  [40 %-50 %] 40 % Set Rate:  [24 bmp] 24 bmp Vt Set:  [480 mL] 480 mL PEEP:  [8 cmH20] 8 cmH20 Plateau Pressure:  [16 cmH20-21 cmH20] 16 cmH20   Intake/Output Summary (Last 24 hours) at 08/15/2019 1332 Last data filed at 08/15/2019 1300 Gross per 24 hour  Intake 2003.18 ml  Output 3780 ml  Net -1776.82 ml   Filed Weights   08/13/19 0500 08/14/19 0500 08/15/19 0500  Weight: 90.8 kg 88.6 kg 88.7 kg  Examination:  General:  In bed on vent HENT: NCAT ETT in place PULM: CTA B, vent supported breathing CV: RRR, no mgr GI: BS+, soft, nontender MSK: normal bulk and tone Neuro: sedated on vent  No air leak on chest tube today  10/23 CXR images personally reviewed: bilateral air space disease, chest tube in place, ETT in place, endobronchial blocker in place   Resolved Hospital Problem list      Assessment & Plan:  ARDS due to COVID-19 pneumonia: improving oxygenation, vent mechanics and oxygenation better Continue full vent support Wean PEEP/FiO2 as able for SaO2 > 85% Minimal sedation Consider pressure support ventilation this week  Pneumothorax right lower lobe, no air leak on 10/23 Likely empyema given purulent drainage from R chest tube and multiple pockets of air Daily CXR  Continue chest tube to suction Took air out of endobronchial blocker today, monitor for air leak Long term plan: likely VATS if air leak recurs  Acute kidney injury: Continue CVVHD Monitor UOP, BMET Appreciate nephrology input  Circulatory shock: resolved Monitor off of vasopressors ICU hemodynamic monitoring  Need for sedation/mechanical ventilation RASS target 0 to -1 Appears very weak on exam 10/22 Stop continuous infusions of narcotics, use prn only  Gastric ulcer: Continue PPI  Anemia, acute hemorrhagic: Hgb down again on 10/23 Hold heparin products Send reticulocyte count Transfuse 1 U PRBC again today SCD  MSSA bacteremia Thick secretions from airway, fungal studies negative  Cefazolin Hold voriconazole  Atrial fib : resolved? Tele Hold amiodarone  Best practice:  Diet: tube feeding Pain/Anxiety/Delirium protocol (if indicated): as above VAP protocol (if indicated): yes DVT prophylaxis: change hep infusion to prophylactic dosing GI prophylaxis: pantoprazole Glucose control: SSI Mobility: bed rest Code Status: DNR Family Communication: Spoke to [name] again today and gave her an update by phone Disposition: remain in ICU  Labs   CBC: Recent Labs  Lab 08/11/19 0500  08/12/19 0500  08/13/19 0500 08/13/19 1030  08/14/19 0530  08/15/19 0221 08/15/19 0227 08/15/19 0500 08/15/19 1033 08/15/19 1034  WBC 8.6  --  8.3  --  9.2 11.9*  --  10.8*  --   --   --  7.5  --   --   NEUTROABS 6.9  --  6.7  --  8.0*  --   --  10.1*  --   --   --  6.9  --   --   HGB 7.6*   <  > 7.5*   < > 6.6* 6.8*   < > 7.9*   < > 8.5* 7.8* 7.3* 8.5* 6.1*  HCT 23.8*   < > 24.2*   < > 21.7* 22.1*   < > 25.4*   < > 25.0* 23.0* 23.1* 25.0* 18.0*  MCV 97.9  --  99.2  --  101.4* 101.8*  --  98.4  --   --   --  96.3  --   --   PLT 114*  --  119*  --  118* 122*  --  130*  --   --   --  122*  --   --    < > = values in this interval not displayed.    Basic Metabolic Panel: Recent Labs  Lab 08/11/19 0500  08/12/19 0500  08/13/19 0500 08/13/19 1740  08/14/19 0530  08/14/19 1715  08/15/19 0221 08/15/19 0227 08/15/19 0500 08/15/19 1033 08/15/19 1034  NA 139  139   < > 140  140   < >  135  134* 137   < > 136   < > 138   < > 139 138 139 140 144  K 4.5  4.5   < > 4.0  4.0   < > 4.5  4.5 5.1   < > 4.6   < > 3.8   < > 3.9 3.9 3.9 3.7 2.9*  CL 93*  93*   < > 95*  94*   < > 97*  96* 101  --  95*  --  96*  --   --   --  92*  --   --   CO2 33*  34*   < > 33*  32   < > 27  27 24   --  27  --  31  --   --   --  34*  --   --   GLUCOSE 130*  129*   < > 215*  216*   < > 113*  112* 119*  --  237*  --  196*  --   --   --  147*  --   --   BUN 66*  66*   < > 45*  46*   < > 44*  43* 48*  --  49*  --  53*  --   --   --  45*  --   --   CREATININE 2.49*  2.50*   < > 1.89*  1.91*   < > 1.86*  1.79* 2.48*  --  2.39*  --  2.28*  --   --   --  2.19*  --   --   CALCIUM 9.3  9.3   < > 7.3*  7.3*   < > 7.9*  7.9* 8.0*  --  7.6*  --  7.5*  --   --   --  7.7*  --   --   MG 2.3  --  2.1  --  2.4  --   --  2.3  --   --   --   --   --  2.1  --   --   PHOS 5.0*  4.8*   < > 2.6   < > 3.4 6.5*  --  5.6*  --  3.3  --   --   --  3.3  --   --    < > = values in this interval not displayed.   GFR: Estimated Creatinine Clearance: 37.2 mL/min (A) (by C-G formula based on SCr of 2.19 mg/dL (H)). Recent Labs  Lab 08/13/19 0500 08/13/19 1030 08/14/19 0530 08/15/19 0500  WBC 9.2 11.9* 10.8* 7.5    Liver Function Tests: Recent Labs  Lab 08/11/19 0500  08/12/19 0500  08/13/19 0500 08/13/19  1740 08/14/19 0530 08/14/19 1715 08/15/19 0500  AST 37  --  32  --  24  --  25  --  29  ALT 10  --  9  --  9  --  8  --  6  ALKPHOS 65  --  63  --  52  --  61  --  59  BILITOT 0.4  --  0.5  --  0.5  --  0.8  --  0.3  PROT 5.9*  --  5.9*  --  6.2*  --  6.2*  --  6.0*  ALBUMIN 2.2*  2.3*   < > 2.1*  2.1*   < > 2.8*  2.8* 2.6* 2.4*  2.4* 2.2*  2.2*  2.2*   < > = values in this interval not displayed.   No results for input(s): LIPASE, AMYLASE in the last 168 hours. No results for input(s): AMMONIA in the last 168 hours.  ABG    Component Value Date/Time   PHART 7.512 (H) 08/15/2019 1034   PCO2ART 33.1 08/15/2019 1034   PO2ART 54.0 (L) 08/15/2019 1034   HCO3 26.5 08/15/2019 1034   TCO2 28 08/15/2019 1034   ACIDBASEDEF 2.0 08/14/2019 0515   O2SAT 91.0 08/15/2019 1034     Coagulation Profile: No results for input(s): INR, PROTIME in the last 168 hours.  Cardiac Enzymes: No results for input(s): CKTOTAL, CKMB, CKMBINDEX, TROPONINI in the last 168 hours.  HbA1C: Hgb A1c MFr Bld  Date/Time Value Ref Range Status  07/26/2019 05:00 AM 6.1 (H) 4.8 - 5.6 % Final    Comment:    (NOTE) Pre diabetes:          5.7%-6.4% Diabetes:              >6.4% Glycemic control for   <7.0% adults with diabetes     CBG: Recent Labs  Lab 08/14/19 1933 08/15/19 0019 08/15/19 0439 08/15/19 0740 08/15/19 1159  GLUCAP 181* 153* 133* 193* 174*       Critical care time: 35 minutes      Roselie Awkward, MD [place] P[facility]M Pager: [phone] Cell: [phone] If no response, call [phone]

## 2019-08-16 ENCOUNTER — Inpatient Hospital Stay (HOSPITAL_COMMUNITY): Payer: Medicare HMO

## 2019-08-16 DIAGNOSIS — Z4682 Encounter for fitting and adjustment of non-vascular catheter: Secondary | ICD-10-CM | POA: Diagnosis not present

## 2019-08-16 DIAGNOSIS — E872 Acidosis: Secondary | ICD-10-CM | POA: Diagnosis not present

## 2019-08-16 DIAGNOSIS — R918 Other nonspecific abnormal finding of lung field: Secondary | ICD-10-CM | POA: Diagnosis not present

## 2019-08-16 DIAGNOSIS — J1289 Other viral pneumonia: Secondary | ICD-10-CM | POA: Diagnosis not present

## 2019-08-16 DIAGNOSIS — I4891 Unspecified atrial fibrillation: Secondary | ICD-10-CM | POA: Diagnosis not present

## 2019-08-16 DIAGNOSIS — I959 Hypotension, unspecified: Secondary | ICD-10-CM | POA: Diagnosis not present

## 2019-08-16 DIAGNOSIS — R579 Shock, unspecified: Secondary | ICD-10-CM | POA: Diagnosis not present

## 2019-08-16 DIAGNOSIS — J9600 Acute respiratory failure, unspecified whether with hypoxia or hypercapnia: Secondary | ICD-10-CM | POA: Diagnosis not present

## 2019-08-16 DIAGNOSIS — U071 COVID-19: Secondary | ICD-10-CM | POA: Diagnosis not present

## 2019-08-16 DIAGNOSIS — D649 Anemia, unspecified: Secondary | ICD-10-CM | POA: Diagnosis not present

## 2019-08-16 DIAGNOSIS — N179 Acute kidney failure, unspecified: Secondary | ICD-10-CM | POA: Diagnosis not present

## 2019-08-16 DIAGNOSIS — J9601 Acute respiratory failure with hypoxia: Secondary | ICD-10-CM | POA: Diagnosis not present

## 2019-08-16 DIAGNOSIS — A4901 Methicillin susceptible Staphylococcus aureus infection, unspecified site: Secondary | ICD-10-CM | POA: Diagnosis not present

## 2019-08-16 LAB — POCT I-STAT 7, (LYTES, BLD GAS, ICA,H+H)
Acid-Base Excess: 11 mmol/L — ABNORMAL HIGH (ref 0.0–2.0)
Acid-Base Excess: 14 mmol/L — ABNORMAL HIGH (ref 0.0–2.0)
Acid-Base Excess: 8 mmol/L — ABNORMAL HIGH (ref 0.0–2.0)
Acid-Base Excess: 12 mmol/L — ABNORMAL HIGH (ref 0.0–2.0)
Acid-Base Excess: 12 mmol/L — ABNORMAL HIGH (ref 0.0–2.0)
Acid-Base Excess: 14 mmol/L — ABNORMAL HIGH (ref 0.0–2.0)
Acid-Base Excess: 17 mmol/L — ABNORMAL HIGH (ref 0.0–2.0)
Acid-Base Excess: 8 mmol/L — ABNORMAL HIGH (ref 0.0–2.0)
Acid-Base Excess: 10 mmol/L — ABNORMAL HIGH (ref 0.0–2.0)
Acid-Base Excess: 13 mmol/L — ABNORMAL HIGH (ref 0.0–2.0)
Acid-Base Excess: 13 mmol/L — ABNORMAL HIGH (ref 0.0–2.0)
Acid-Base Excess: 12 mmol/L — ABNORMAL HIGH (ref 0.0–2.0)
Bicarbonate: 33.4 mmol/L — ABNORMAL HIGH (ref 20.0–28.0)
Bicarbonate: 36 mmol/L — ABNORMAL HIGH (ref 20.0–28.0)
Bicarbonate: 38.5 mmol/L — ABNORMAL HIGH (ref 20.0–28.0)
Bicarbonate: 33.9 mmol/L — ABNORMAL HIGH (ref 20.0–28.0)
Bicarbonate: 36.1 mmol/L — ABNORMAL HIGH (ref 20.0–28.0)
Bicarbonate: 36.5 mmol/L — ABNORMAL HIGH (ref 20.0–28.0)
Bicarbonate: 37.3 mmol/L — ABNORMAL HIGH (ref 20.0–28.0)
Bicarbonate: 39.9 mmol/L — ABNORMAL HIGH (ref 20.0–28.0)
Bicarbonate: 35.8 mmol/L — ABNORMAL HIGH (ref 20.0–28.0)
Bicarbonate: 37.2 mmol/L — ABNORMAL HIGH (ref 20.0–28.0)
Bicarbonate: 36.8 mmol/L — ABNORMAL HIGH (ref 20.0–28.0)
Bicarbonate: 36.1 mmol/L — ABNORMAL HIGH (ref 20.0–28.0)
Calcium, Ion: 0.48 mmol/L — CL (ref 1.15–1.40)
Calcium, Ion: 0.95 mmol/L — ABNORMAL LOW (ref 1.15–1.40)
Calcium, Ion: 1.01 mmol/L — ABNORMAL LOW (ref 1.15–1.40)
Calcium, Ion: 0.53 mmol/L — CL (ref 1.15–1.40)
Calcium, Ion: 0.66 mmol/L — CL (ref 1.15–1.40)
Calcium, Ion: 0.97 mmol/L — ABNORMAL LOW (ref 1.15–1.40)
Calcium, Ion: 0.98 mmol/L — ABNORMAL LOW (ref 1.15–1.40)
Calcium, Ion: 0.99 mmol/L — ABNORMAL LOW (ref 1.15–1.40)
Calcium, Ion: 1.01 mmol/L — ABNORMAL LOW (ref 1.15–1.40)
Calcium, Ion: 1.02 mmol/L — ABNORMAL LOW (ref 1.15–1.40)
Calcium, Ion: 1.05 mmol/L — ABNORMAL LOW (ref 1.15–1.40)
Calcium, Ion: 1.04 mmol/L — ABNORMAL LOW (ref 1.15–1.40)
HCT: 27 % — ABNORMAL LOW (ref 39.0–52.0)
HCT: 29 % — ABNORMAL LOW (ref 39.0–52.0)
HCT: 29 % — ABNORMAL LOW (ref 39.0–52.0)
HCT: 26 % — ABNORMAL LOW (ref 39.0–52.0)
HCT: 26 % — ABNORMAL LOW (ref 39.0–52.0)
HCT: 28 % — ABNORMAL LOW (ref 39.0–52.0)
HCT: 28 % — ABNORMAL LOW (ref 39.0–52.0)
HCT: 37 % — ABNORMAL LOW (ref 39.0–52.0)
HCT: 25 % — ABNORMAL LOW (ref 39.0–52.0)
HCT: 26 % — ABNORMAL LOW (ref 39.0–52.0)
HCT: 26 % — ABNORMAL LOW (ref 39.0–52.0)
HCT: 23 % — ABNORMAL LOW (ref 39.0–52.0)
Hemoglobin: 9.2 g/dL — ABNORMAL LOW (ref 13.0–17.0)
Hemoglobin: 9.9 g/dL — ABNORMAL LOW (ref 13.0–17.0)
Hemoglobin: 9.9 g/dL — ABNORMAL LOW (ref 13.0–17.0)
Hemoglobin: 12.6 g/dL — ABNORMAL LOW (ref 13.0–17.0)
Hemoglobin: 8.8 g/dL — ABNORMAL LOW (ref 13.0–17.0)
Hemoglobin: 8.8 g/dL — ABNORMAL LOW (ref 13.0–17.0)
Hemoglobin: 9.5 g/dL — ABNORMAL LOW (ref 13.0–17.0)
Hemoglobin: 9.5 g/dL — ABNORMAL LOW (ref 13.0–17.0)
Hemoglobin: 8.5 g/dL — ABNORMAL LOW (ref 13.0–17.0)
Hemoglobin: 8.8 g/dL — ABNORMAL LOW (ref 13.0–17.0)
Hemoglobin: 8.8 g/dL — ABNORMAL LOW (ref 13.0–17.0)
Hemoglobin: 7.8 g/dL — ABNORMAL LOW (ref 13.0–17.0)
O2 Saturation: 70 %
O2 Saturation: 94 %
O2 Saturation: 97 %
O2 Saturation: 68 %
O2 Saturation: 70 %
O2 Saturation: 88 %
O2 Saturation: 94 %
O2 Saturation: 96 %
O2 Saturation: 76 %
O2 Saturation: 95 %
O2 Saturation: 94 %
O2 Saturation: 99 %
Patient temperature: 35.8
Patient temperature: 36.4
Patient temperature: 36.4
Patient temperature: 37
Patient temperature: 37
Patient temperature: 37.3
Patient temperature: 38
Patient temperature: 38
Patient temperature: 36.5
Patient temperature: 36.7
Patient temperature: 37.1
Potassium: 3.9 mmol/L (ref 3.5–5.1)
Potassium: 4 mmol/L (ref 3.5–5.1)
Potassium: 4 mmol/L (ref 3.5–5.1)
Potassium: 3.7 mmol/L (ref 3.5–5.1)
Potassium: 3.8 mmol/L (ref 3.5–5.1)
Potassium: 4 mmol/L (ref 3.5–5.1)
Potassium: 4 mmol/L (ref 3.5–5.1)
Potassium: 4 mmol/L (ref 3.5–5.1)
Potassium: 4 mmol/L (ref 3.5–5.1)
Potassium: 4.1 mmol/L (ref 3.5–5.1)
Potassium: 4.1 mmol/L (ref 3.5–5.1)
Potassium: 4.1 mmol/L (ref 3.5–5.1)
Sodium: 138 mmol/L (ref 135–145)
Sodium: 139 mmol/L (ref 135–145)
Sodium: 142 mmol/L (ref 135–145)
Sodium: 138 mmol/L (ref 135–145)
Sodium: 139 mmol/L (ref 135–145)
Sodium: 139 mmol/L (ref 135–145)
Sodium: 139 mmol/L (ref 135–145)
Sodium: 141 mmol/L (ref 135–145)
Sodium: 138 mmol/L (ref 135–145)
Sodium: 138 mmol/L (ref 135–145)
Sodium: 137 mmol/L (ref 135–145)
Sodium: 138 mmol/L (ref 135–145)
TCO2: 35 mmol/L — ABNORMAL HIGH (ref 22–32)
TCO2: 37 mmol/L — ABNORMAL HIGH (ref 22–32)
TCO2: 40 mmol/L — ABNORMAL HIGH (ref 22–32)
TCO2: 35 mmol/L — ABNORMAL HIGH (ref 22–32)
TCO2: 38 mmol/L — ABNORMAL HIGH (ref 22–32)
TCO2: 38 mmol/L — ABNORMAL HIGH (ref 22–32)
TCO2: 38 mmol/L — ABNORMAL HIGH (ref 22–32)
TCO2: 41 mmol/L — ABNORMAL HIGH (ref 22–32)
TCO2: 37 mmol/L — ABNORMAL HIGH (ref 22–32)
TCO2: 39 mmol/L — ABNORMAL HIGH (ref 22–32)
TCO2: 38 mmol/L — ABNORMAL HIGH (ref 22–32)
TCO2: 38 mmol/L — ABNORMAL HIGH (ref 22–32)
pCO2 arterial: 46.5 mmHg (ref 32.0–48.0)
pCO2 arterial: 47.1 mmHg (ref 32.0–48.0)
pCO2 arterial: 50.9 mmHg — ABNORMAL HIGH (ref 32.0–48.0)
pCO2 arterial: 39.6 mmHg (ref 32.0–48.0)
pCO2 arterial: 43.7 mmHg (ref 32.0–48.0)
pCO2 arterial: 47.1 mmHg (ref 32.0–48.0)
pCO2 arterial: 47.9 mmHg (ref 32.0–48.0)
pCO2 arterial: 53.2 mmHg — ABNORMAL HIGH (ref 32.0–48.0)
pCO2 arterial: 43.1 mmHg (ref 32.0–48.0)
pCO2 arterial: 52 mmHg — ABNORMAL HIGH (ref 32.0–48.0)
pCO2 arterial: 41.6 mmHg (ref 32.0–48.0)
pCO2 arterial: 47.6 mmHg (ref 32.0–48.0)
pH, Arterial: 7.422 (ref 7.350–7.450)
pH, Arterial: 7.495 — ABNORMAL HIGH (ref 7.350–7.450)
pH, Arterial: 7.516 — ABNORMAL HIGH (ref 7.350–7.450)
pH, Arterial: 7.412 (ref 7.350–7.450)
pH, Arterial: 7.493 — ABNORMAL HIGH (ref 7.350–7.450)
pH, Arterial: 7.494 — ABNORMAL HIGH (ref 7.350–7.450)
pH, Arterial: 7.572 — ABNORMAL HIGH (ref 7.350–7.450)
pH, Arterial: 7.583 — ABNORMAL HIGH (ref 7.350–7.450)
pH, Arterial: 7.443 (ref 7.350–7.450)
pH, Arterial: 7.544 — ABNORMAL HIGH (ref 7.350–7.450)
pH, Arterial: 7.555 — ABNORMAL HIGH (ref 7.350–7.450)
pH, Arterial: 7.488 — ABNORMAL HIGH (ref 7.350–7.450)
pO2, Arterial: 36 mmHg — CL (ref 83.0–108.0)
pO2, Arterial: 64 mmHg — ABNORMAL LOW (ref 83.0–108.0)
pO2, Arterial: 81 mmHg — ABNORMAL LOW (ref 83.0–108.0)
pO2, Arterial: 36 mmHg — CL (ref 83.0–108.0)
pO2, Arterial: 36 mmHg — CL (ref 83.0–108.0)
pO2, Arterial: 47 mmHg — ABNORMAL LOW (ref 83.0–108.0)
pO2, Arterial: 68 mmHg — ABNORMAL LOW (ref 83.0–108.0)
pO2, Arterial: 73 mmHg — ABNORMAL LOW (ref 83.0–108.0)
pO2, Arterial: 39 mmHg — CL (ref 83.0–108.0)
pO2, Arterial: 65 mmHg — ABNORMAL LOW (ref 83.0–108.0)
pO2, Arterial: 64 mmHg — ABNORMAL LOW (ref 83.0–108.0)
pO2, Arterial: 144 mmHg — ABNORMAL HIGH (ref 83.0–108.0)

## 2019-08-16 LAB — POCT I-STAT, CHEM 8
BUN: 28 mg/dL — ABNORMAL HIGH (ref 8–23)
BUN: 32 mg/dL — ABNORMAL HIGH (ref 8–23)
BUN: 30 mg/dL — ABNORMAL HIGH (ref 8–23)
BUN: 39 mg/dL — ABNORMAL HIGH (ref 8–23)
BUN: 31 mg/dL — ABNORMAL HIGH (ref 8–23)
Calcium, Ion: 0.59 mmol/L — CL (ref 1.15–1.40)
Calcium, Ion: 1.01 mmol/L — ABNORMAL LOW (ref 1.15–1.40)
Calcium, Ion: 0.42 mmol/L — CL (ref 1.15–1.40)
Calcium, Ion: 1.04 mmol/L — ABNORMAL LOW (ref 1.15–1.40)
Calcium, Ion: 0.41 mmol/L — CL (ref 1.15–1.40)
Chloride: 91 mmol/L — ABNORMAL LOW (ref 98–111)
Chloride: 91 mmol/L — ABNORMAL LOW (ref 98–111)
Chloride: 90 mmol/L — ABNORMAL LOW (ref 98–111)
Chloride: 91 mmol/L — ABNORMAL LOW (ref 98–111)
Chloride: 91 mmol/L — ABNORMAL LOW (ref 98–111)
Creatinine, Ser: 1.7 mg/dL — ABNORMAL HIGH (ref 0.61–1.24)
Creatinine, Ser: 2.2 mg/dL — ABNORMAL HIGH (ref 0.61–1.24)
Creatinine, Ser: 1.6 mg/dL — ABNORMAL HIGH (ref 0.61–1.24)
Creatinine, Ser: 2.1 mg/dL — ABNORMAL HIGH (ref 0.61–1.24)
Creatinine, Ser: 1.6 mg/dL — ABNORMAL HIGH (ref 0.61–1.24)
Glucose, Bld: 139 mg/dL — ABNORMAL HIGH (ref 70–99)
Glucose, Bld: 167 mg/dL — ABNORMAL HIGH (ref 70–99)
Glucose, Bld: 197 mg/dL — ABNORMAL HIGH (ref 70–99)
Glucose, Bld: 240 mg/dL — ABNORMAL HIGH (ref 70–99)
Glucose, Bld: 235 mg/dL — ABNORMAL HIGH (ref 70–99)
HCT: 26 % — ABNORMAL LOW (ref 39.0–52.0)
HCT: 26 % — ABNORMAL LOW (ref 39.0–52.0)
HCT: 25 % — ABNORMAL LOW (ref 39.0–52.0)
HCT: 29 % — ABNORMAL LOW (ref 39.0–52.0)
HCT: 27 % — ABNORMAL LOW (ref 39.0–52.0)
Hemoglobin: 8.8 g/dL — ABNORMAL LOW (ref 13.0–17.0)
Hemoglobin: 8.8 g/dL — ABNORMAL LOW (ref 13.0–17.0)
Hemoglobin: 8.5 g/dL — ABNORMAL LOW (ref 13.0–17.0)
Hemoglobin: 9.9 g/dL — ABNORMAL LOW (ref 13.0–17.0)
Hemoglobin: 9.2 g/dL — ABNORMAL LOW (ref 13.0–17.0)
Potassium: 4 mmol/L (ref 3.5–5.1)
Potassium: 4 mmol/L (ref 3.5–5.1)
Potassium: 4 mmol/L (ref 3.5–5.1)
Potassium: 4.1 mmol/L (ref 3.5–5.1)
Potassium: 3.8 mmol/L (ref 3.5–5.1)
Sodium: 138 mmol/L (ref 135–145)
Sodium: 139 mmol/L (ref 135–145)
Sodium: 137 mmol/L (ref 135–145)
Sodium: 139 mmol/L (ref 135–145)
Sodium: 139 mmol/L (ref 135–145)
TCO2: 31 mmol/L (ref 22–32)
TCO2: 34 mmol/L — ABNORMAL HIGH (ref 22–32)
TCO2: 30 mmol/L (ref 22–32)
TCO2: 34 mmol/L — ABNORMAL HIGH (ref 22–32)
TCO2: 30 mmol/L (ref 22–32)

## 2019-08-16 LAB — POCT I-STAT EG7
Acid-Base Excess: 11 mmol/L — ABNORMAL HIGH (ref 0.0–2.0)
Acid-Base Excess: 7 mmol/L — ABNORMAL HIGH (ref 0.0–2.0)
Acid-Base Excess: 8 mmol/L — ABNORMAL HIGH (ref 0.0–2.0)
Acid-Base Excess: 8 mmol/L — ABNORMAL HIGH (ref 0.0–2.0)
Acid-Base Excess: 6 mmol/L — ABNORMAL HIGH (ref 0.0–2.0)
Bicarbonate: 36.3 mmol/L — ABNORMAL HIGH (ref 20.0–28.0)
Bicarbonate: 32.8 mmol/L — ABNORMAL HIGH (ref 20.0–28.0)
Bicarbonate: 33.2 mmol/L — ABNORMAL HIGH (ref 20.0–28.0)
Bicarbonate: 32.4 mmol/L — ABNORMAL HIGH (ref 20.0–28.0)
Bicarbonate: 32 mmol/L — ABNORMAL HIGH (ref 20.0–28.0)
Calcium, Ion: 0.48 mmol/L — CL (ref 1.15–1.40)
Calcium, Ion: 0.47 mmol/L — CL (ref 1.15–1.40)
Calcium, Ion: 0.47 mmol/L — CL (ref 1.15–1.40)
Calcium, Ion: 0.47 mmol/L — CL (ref 1.15–1.40)
Calcium, Ion: 0.45 mmol/L — CL (ref 1.15–1.40)
HCT: 28 % — ABNORMAL LOW (ref 39.0–52.0)
HCT: 27 % — ABNORMAL LOW (ref 39.0–52.0)
HCT: 28 % — ABNORMAL LOW (ref 39.0–52.0)
HCT: 27 % — ABNORMAL LOW (ref 39.0–52.0)
HCT: 28 % — ABNORMAL LOW (ref 39.0–52.0)
Hemoglobin: 9.5 g/dL — ABNORMAL LOW (ref 13.0–17.0)
Hemoglobin: 9.2 g/dL — ABNORMAL LOW (ref 13.0–17.0)
Hemoglobin: 9.5 g/dL — ABNORMAL LOW (ref 13.0–17.0)
Hemoglobin: 9.2 g/dL — ABNORMAL LOW (ref 13.0–17.0)
Hemoglobin: 9.5 g/dL — ABNORMAL LOW (ref 13.0–17.0)
O2 Saturation: 73 %
O2 Saturation: 76 %
O2 Saturation: 72 %
O2 Saturation: 68 %
O2 Saturation: 75 %
Patient temperature: 35.8
Patient temperature: 36.5
Patient temperature: 36.7
Patient temperature: 37.1
Potassium: 4.3 mmol/L (ref 3.5–5.1)
Potassium: 4 mmol/L (ref 3.5–5.1)
Potassium: 3.9 mmol/L (ref 3.5–5.1)
Potassium: 4 mmol/L (ref 3.5–5.1)
Potassium: 4 mmol/L (ref 3.5–5.1)
Sodium: 139 mmol/L (ref 135–145)
Sodium: 139 mmol/L (ref 135–145)
Sodium: 139 mmol/L (ref 135–145)
Sodium: 138 mmol/L (ref 135–145)
Sodium: 139 mmol/L (ref 135–145)
TCO2: 38 mmol/L — ABNORMAL HIGH (ref 22–32)
TCO2: 34 mmol/L — ABNORMAL HIGH (ref 22–32)
TCO2: 35 mmol/L — ABNORMAL HIGH (ref 22–32)
TCO2: 34 mmol/L — ABNORMAL HIGH (ref 22–32)
TCO2: 34 mmol/L — ABNORMAL HIGH (ref 22–32)
pCO2, Ven: 51.5 mmHg (ref 44.0–60.0)
pCO2, Ven: 48.9 mmHg (ref 44.0–60.0)
pCO2, Ven: 47.2 mmHg (ref 44.0–60.0)
pCO2, Ven: 44.6 mmHg (ref 44.0–60.0)
pCO2, Ven: 51.5 mmHg (ref 44.0–60.0)
pH, Ven: 7.451 — ABNORMAL HIGH (ref 7.250–7.430)
pH, Ven: 7.432 — ABNORMAL HIGH (ref 7.250–7.430)
pH, Ven: 7.454 — ABNORMAL HIGH (ref 7.250–7.430)
pH, Ven: 7.47 — ABNORMAL HIGH (ref 7.250–7.430)
pH, Ven: 7.401 (ref 7.250–7.430)
pO2, Ven: 35 mmHg (ref 32.0–45.0)
pO2, Ven: 39 mmHg (ref 32.0–45.0)
pO2, Ven: 36 mmHg (ref 32.0–45.0)
pO2, Ven: 34 mmHg (ref 32.0–45.0)
pO2, Ven: 41 mmHg (ref 32.0–45.0)

## 2019-08-16 LAB — CBC WITH DIFFERENTIAL/PLATELET
Abs Immature Granulocytes: 0.09 10*3/uL — ABNORMAL HIGH (ref 0.00–0.07)
Basophils Absolute: 0 10*3/uL (ref 0.0–0.1)
Basophils Relative: 0 %
Eosinophils Absolute: 0.1 10*3/uL (ref 0.0–0.5)
Eosinophils Relative: 1 %
HCT: 26.6 % — ABNORMAL LOW (ref 39.0–52.0)
Hemoglobin: 8.3 g/dL — ABNORMAL LOW (ref 13.0–17.0)
Immature Granulocytes: 1 %
Lymphocytes Relative: 2 %
Lymphs Abs: 0.2 10*3/uL — ABNORMAL LOW (ref 0.7–4.0)
MCH: 30 pg (ref 26.0–34.0)
MCHC: 31.2 g/dL (ref 30.0–36.0)
MCV: 96 fL (ref 80.0–100.0)
Monocytes Absolute: 0.3 10*3/uL (ref 0.1–1.0)
Monocytes Relative: 3 %
Neutro Abs: 8.3 10*3/uL — ABNORMAL HIGH (ref 1.7–7.7)
Neutrophils Relative %: 93 %
Platelets: 129 10*3/uL — ABNORMAL LOW (ref 150–400)
RBC: 2.77 MIL/uL — ABNORMAL LOW (ref 4.22–5.81)
RDW: 16.2 % — ABNORMAL HIGH (ref 11.5–15.5)
WBC: 9 10*3/uL (ref 4.0–10.5)
nRBC: 0.7 % — ABNORMAL HIGH (ref 0.0–0.2)

## 2019-08-16 LAB — RENAL FUNCTION PANEL
Albumin: 2.2 g/dL — ABNORMAL LOW (ref 3.5–5.0)
Albumin: 1.9 g/dL — ABNORMAL LOW (ref 3.5–5.0)
Anion gap: 13 (ref 5–15)
Anion gap: 11 (ref 5–15)
BUN: 36 mg/dL — ABNORMAL HIGH (ref 8–23)
BUN: 39 mg/dL — ABNORMAL HIGH (ref 8–23)
CO2: 33 mmol/L — ABNORMAL HIGH (ref 22–32)
CO2: 32 mmol/L (ref 22–32)
Calcium: 7.8 mg/dL — ABNORMAL LOW (ref 8.9–10.3)
Calcium: 7.9 mg/dL — ABNORMAL LOW (ref 8.9–10.3)
Chloride: 93 mmol/L — ABNORMAL LOW (ref 98–111)
Chloride: 96 mmol/L — ABNORMAL LOW (ref 98–111)
Creatinine, Ser: 2.23 mg/dL — ABNORMAL HIGH (ref 0.61–1.24)
Creatinine, Ser: 2.09 mg/dL — ABNORMAL HIGH (ref 0.61–1.24)
GFR calc Af Amer: 34 mL/min — ABNORMAL LOW (ref >60)
GFR calc Af Amer: 37 mL/min — ABNORMAL LOW (ref >60)
GFR calc non Af Amer: 30 mL/min — ABNORMAL LOW (ref >60)
GFR calc non Af Amer: 32 mL/min — ABNORMAL LOW (ref >60)
Glucose, Bld: 132 mg/dL — ABNORMAL HIGH (ref 70–99)
Glucose, Bld: 203 mg/dL — ABNORMAL HIGH (ref 70–99)
Phosphorus: 3.3 mg/dL (ref 2.5–4.6)
Phosphorus: 3.1 mg/dL (ref 2.5–4.6)
Potassium: 4.1 mmol/L (ref 3.5–5.1)
Potassium: 4.1 mmol/L (ref 3.5–5.1)
Sodium: 139 mmol/L (ref 135–145)
Sodium: 139 mmol/L (ref 135–145)

## 2019-08-16 LAB — HEPATIC FUNCTION PANEL
ALT: 8 U/L (ref 0–44)
AST: 38 U/L (ref 15–41)
Albumin: 2.1 g/dL — ABNORMAL LOW (ref 3.5–5.0)
Alkaline Phosphatase: 71 U/L (ref 38–126)
Bilirubin, Direct: 0.1 mg/dL (ref 0.0–0.2)
Indirect Bilirubin: 0.5 mg/dL (ref 0.3–0.9)
Total Bilirubin: 0.6 mg/dL (ref 0.3–1.2)
Total Protein: 6.2 g/dL — ABNORMAL LOW (ref 6.5–8.1)

## 2019-08-16 LAB — CALCIUM, IONIZED: Calcium, Ionized, Serum: 3.9 mg/dL — ABNORMAL LOW (ref 4.5–5.6)

## 2019-08-16 LAB — GLUCOSE, CAPILLARY
Glucose-Capillary: 100 mg/dL — ABNORMAL HIGH (ref 70–99)
Glucose-Capillary: 139 mg/dL — ABNORMAL HIGH (ref 70–99)
Glucose-Capillary: 167 mg/dL — ABNORMAL HIGH (ref 70–99)
Glucose-Capillary: 168 mg/dL — ABNORMAL HIGH (ref 70–99)
Glucose-Capillary: 194 mg/dL — ABNORMAL HIGH (ref 70–99)
Glucose-Capillary: 230 mg/dL — ABNORMAL HIGH (ref 70–99)

## 2019-08-16 LAB — EXPECTORATED SPUTUM ASSESSMENT W GRAM STAIN, RFLX TO RESP C: Special Requests: NORMAL

## 2019-08-16 MED ORDER — HEPARIN SODIUM (PORCINE) 10000 UNIT/ML IJ SOLN
7500.0000 [IU] | Freq: Three times a day (TID) | INTRAMUSCULAR | Status: DC
  Administered 2019-08-16 – 2019-08-30 (×42): 7500 [IU] via SUBCUTANEOUS
  Filled 2019-08-16 (×42): qty 1

## 2019-08-16 MED ORDER — SODIUM CHLORIDE 3 % IN NEBU
4.0000 mL | INHALATION_SOLUTION | Freq: Every day | RESPIRATORY_TRACT | Status: DC
  Administered 2019-08-16 – 2019-08-17 (×2): 4 mL via RESPIRATORY_TRACT
  Filled 2019-08-16 (×3): qty 4

## 2019-08-16 MED ORDER — DOXAZOSIN MESYLATE 2 MG PO TABS
2.0000 mg | ORAL_TABLET | Freq: Every day | ORAL | Status: DC
  Administered 2019-08-16 – 2019-08-20 (×4): 2 mg
  Filled 2019-08-16 (×9): qty 1

## 2019-08-16 MED ORDER — VECURONIUM BROMIDE 10 MG IV SOLR
10.0000 mg | Freq: Once | INTRAVENOUS | Status: AC
  Administered 2019-08-16: 10 mg via INTRAVENOUS
  Filled 2019-08-16: qty 10

## 2019-08-16 MED ORDER — SODIUM CHLORIDE 0.9 % IV SOLN
INTRAVENOUS | Status: DC | PRN
  Administered 2019-08-16: 1000 mL via INTRAVENOUS
  Administered 2019-08-17 – 2019-08-21 (×3): 250 mL via INTRAVENOUS

## 2019-08-16 MED ORDER — DEXTROSE 10 % IV SOLN
INTRAVENOUS | Status: DC | PRN
  Administered 2019-08-16: 02:00:00 via INTRAVENOUS

## 2019-08-16 NOTE — Progress Notes (Signed)
OG tube occluded, unable to flush or aspirate contents. TF and per tube meds held, Dr. Shanon Brow informed.   Pt with severe asynchrony, not tolerating ventilator despite versed and fentanyl pushes. Asynchrony causing desaturations to the 80s, belly breathing. Elink notified, awaiting orders.

## 2019-08-16 NOTE — Progress Notes (Signed)
ANTICOAGULATION CONSULT NOTE - Follow Up Consult  Pharmacy Consult for Heparin Indication: VTE prophylaxis  No Known Allergies  Patient Measurements: Height: 5\' 10"  (177.8 cm) Weight: 190 lb 7.6 oz (86.4 kg) IBW/kg (Calculated) : 73 Heparin Dosing Weight:  Vital Signs: Temp: 98.1 F (36.7 C) (10/24 1200) Temp Source: Esophageal (10/24 1200) BP: 116/49 (10/24 1429) Pulse Rate: 120 (10/24 1429)  Labs: Recent Labs    08/15/19 0500  08/15/19 1510  08/16/19 0402 08/16/19 0409 08/16/19 0430  08/16/19 1032 08/16/19 1236 08/16/19 1240  HGB 7.3*   < > 8.6*   < > 8.8* 8.8* 8.3*   < > 9.2* 8.8* 9.5*  HCT 23.1*   < > 26.9*   < > 26.0* 26.0* 26.6*   < > 27.0* 26.0* 28.0*  PLT 122*  --  149*  --   --   --  129*  --   --   --   --   CREATININE 2.19*  --  1.96*  --  1.70* 2.20* 2.23*  --   --   --   --    < > = values in this interval not displayed.    Estimated Creatinine Clearance: 33.6 mL/min (A) (by C-G formula based on SCr of 2.23 mg/dL (H)).   Medications:  Scheduled:  . amLODipine  10 mg Per Tube Daily  . chlorhexidine  15 mL Mouth/Throat BID  . Chlorhexidine Gluconate Cloth  6 each Topical Daily  . doxazosin  2 mg Per Tube Daily  . feeding supplement (PRO-STAT SUGAR FREE 64)  60 mL Per Tube TID  . heparin injection (subcutaneous)  7,500 Units Subcutaneous Q8H  . hydrALAZINE  25 mg Per Tube Q6H  . insulin aspart  0-9 Units Subcutaneous Q4H  . mouth rinse  15 mL Mouth Rinse 10 times per day  . pantoprazole (PROTONIX) IV  40 mg Intravenous Q12H  . polyethylene glycol  17 g Per Tube QHS  . sennosides  5 mL Per Tube QHS  . sodium chloride HYPERTONIC  4 mL Nebulization Daily  . sucralfate  1 g Oral Q6H   Infusions:  .  prismasol BGK 4/2.5 200 mL/hr at 08/15/19 2009  . sodium chloride Stopped (08/16/19 0431)  . sodium chloride 5 mL/hr at 08/16/19 1300  . calcium gluconate infusion for CRRT 60 mL/hr at 08/16/19 1300  . dextrose 40 mL/hr at 08/16/19 1300  . feeding  supplement (PIVOT 1.5 CAL) 1,000 mL (08/16/19 1214)  . meropenem (MERREM) IV Stopped (08/16/19 0444)  . norepinephrine (LEVOPHED) Adult infusion Stopped (08/15/19 2110)  . prismasol B22GK 4/0 1,800 mL/hr at 08/16/19 1340  . sodium citrate 2 %/dextrose 2.5% solution 3000 mL 350 mL/hr at 08/16/19 1243    Assessment: 25 yom currently off anticoagulation d/t GIB.  He was briefly on therapeutic Lovenox for suspected VTE (negative) and then on prophylaxis.  He had and EGD on 10/17: s/p clipping; stigmata peptic ulcer but no active bleed.  After that he was briefly started on therapeutic Heparin for Afib on 10/20, then d/c on 10/21 d/t concern for GIB.  He has been off anticoagulation since that time.  Pharmacy is consulted to resume anticoagulation with Heparin for VTE prophylaxis.    BMI < 35, D-dimer  8.8 CBC: Hgb increased to 8.3 today from 7.3 yesterday.  Last transfused 1 unit PRBC on 10/21. Plt remain low ~129 FOB positive on 10/17 and 10/21.  Per RN, stool does not appear dark or bloody.  No other  bleeding or complications reported.   Goal of Therapy:  Monitor platelets by anticoagulation protocol: Yes   Plan:  Heparin 7500 units [place] TID. Follow up GIB, CBC, s/s bleeding, thrombosis.  Gretta Arab PharmD, BCPS Clinical pharmacist phone 7am- 5pm: [phone] 08/16/2019 2:49 PM

## 2019-08-16 NOTE — Progress Notes (Signed)
Endobronchial blocker remains secured at 56.5. Cuff deflated.

## 2019-08-16 NOTE — Progress Notes (Signed)
[facility] Progress Note   68 year old gentleman who was admitted with Covid pneumonia July 13, 2019 completed course of remdesivir convalescent plasma Decadron treated with vancomycin and cefepime from 08/01/2019 until 08/02/2019.  On 08/02/2019 patient developed an ileus.  His creatinine on 07/25/2019 was 1.5 which appears to be his baseline.  On 08/02/2019 started to increase.  On 08/02/2019 was found to have MSSA bacteremia and was started on Ancef.  His blood pressure has been marginal with drops in blood pressure to 70 mmHg.  His renal ultrasound showed no hydronephrosis on 08/07/2019. Pt had administration of intravenous contrast on 07/25/2019.  Levo gtt off at this time.   Remains off of IV heparin due to poss GIB.  Assessment/ Plan:    Acute kidney injury: on CKD w/ baseline creat of 1.5. Admitted with Covid pneumonia complicated course with recent history of MSSA bacteremia. Developed shock hypotension and required pressors. No evidence of hydronephrosis on renal ultrasound. Suspected ATN.There was administration of contrast but this was early on the course of his illness on 07/25/2019.For metabolic acidosis was started on CRRT 08/07/2019. This is D#10 with no signs of recovery at this time.  Seen on CVVHDF 200/350/1500 all 4K baths Net even for now ? Getting citrate local a/c protocol ? Keeping even w/ CRRT  Anemia / sp GIB - as per primary team transfuse as necessary  COVID-19 pneumonia- Treated with remdesivir, dexamethasone and convalescent plasma  Afib / RVR - new, IV amio  GIB - acute, sp EGD 10/17 w/ large gastric ulcer/ w visible vessel, Rx'd by clipping  VDRF/ R PTX w/ chest tube  Shock - Off pressors at this time   MSSA bacteremia/ ? Fungal airway infectino - on IV Ancef and antifungals  Subjective:   Off pressors and tolerating CRRT.   Objective:   BP (!) 111/59   Pulse (!) 120   Temp 98.1 F (36.7 C) (Esophageal)   Resp (!) 34    Ht 5\' 10"  (1.778 m)   Wt 86.4 kg   SpO2 97%   BMI 27.33 kg/m   Intake/Output Summary (Last 24 hours) at 08/16/2019 1616 Last data filed at 08/16/2019 1600 Gross per 24 hour  Intake 3505.46 ml  Output 3336 ml  Net 169.46 ml   Weight change: -2.3 kg  Physical Exam: Seen in ICU, on vent, sedated   L IJ temp HD cath   No jvd   Chest cta bilat   Cor reg no RG    Abd soft obese    Ext  no LE edema     Neuro sedated   Imaging: Dg Chest Port 1 View  Result Date: 08/16/2019 CLINICAL DATA:  Hypoxia EXAM: PORTABLE CHEST 1 VIEW COMPARISON:  August 15, 2019 FINDINGS: Endotracheal tube tip is 4.2 cm above the carina. Nasogastric tube tip and side port are below the diaphragm. Right subclavian catheter tip is in the right atrium. Chest tube noted on the right inferiorly, unchanged in position. Left internal jugular catheter tip is at the junction of the left innominate vein and superior vena cava. There is a catheter with its tip overlying the right lower lobe bronchus medially, stable. No pneumothorax is evident currently. There is persistent airspace consolidation in the right mid and lower lung zones. There is patchy opacity in the left mid and lower lung zones, stable. No new opacity evident. Heart is upper normal in size with pulmonary vascular normal. No adenopathy. No bone lesions. IMPRESSION: Stable tube and catheter  positions. No pneumothorax evident currently. Airspace opacity bilaterally with areas of consolidation on the right, stable. No new opacity evident. Stable cardiac silhouette. Electronically Signed   By: Lowella Grip III M.D.   On: 08/16/2019 09:06   Dg Chest Port 1 View  Result Date: 08/15/2019 CLINICAL DATA:  Orogastric tube placement. EXAM: PORTABLE CHEST 1 VIEW COMPARISON:  Same day. FINDINGS: Stable cardiomediastinal silhouette. Distal tip of enteric tube is seen in proximal stomach. Endotracheal tube is unchanged. Left internal jugular catheter is unchanged. Right  subclavian catheter is unchanged. Stable position of right-sided basilar chest tube is noted without pneumothorax. Stable bilateral lung opacities are noted. Stable catheter seen entering right mainstem bronchus compared to prior exam. Bony thorax is unremarkable. IMPRESSION: Feeding tube has been removed. There has been interval placement of what appears to be nasogastric tube with tip in expected position of proximal stomach. Stable bilateral lung opacities and other support apparatus are noted. Electronically Signed   By: Marijo Conception M.D.   On: 08/15/2019 14:55   Dg Chest Port 1 View  Result Date: 08/15/2019 CLINICAL DATA:  Chest tube, shortness of breath EXAM: PORTABLE CHEST 1 VIEW COMPARISON:  08/14/2019 FINDINGS: Support devices including right basilar chest tube remain in place, unchanged. No definite pneumothorax currently. Patchy bilateral airspace opacities are again noted, unchanged. Heart is borderline in size. IMPRESSION: Patchy bilateral airspace disease again noted, unchanged. No definite visible pneumothorax. Electronically Signed   By: Rolm Baptise M.D.   On: 08/15/2019 07:58   Dg Abd Portable 1v  Result Date: 08/16/2019 CLINICAL DATA:  OG tube placement EXAM: PORTABLE ABDOMEN - 1 VIEW COMPARISON:  08/13/2019 FINDINGS: Nasogastric tube with the tip projecting over the stomach. There is no bowel dilatation to suggest obstruction. There is no evidence of pneumoperitoneum, portal venous gas or pneumatosis. There are no pathologic calcifications along the expected course of the ureters. The osseous structures are unremarkable. IMPRESSION: Nasogastric tube with the tip projecting over the stomach. Electronically Signed   By: Kathreen Devoid   On: 08/16/2019 12:09    Labs: BMET Recent Labs  Lab 08/13/19 0500 08/13/19 1740  08/14/19 0530  08/14/19 1715  08/15/19 0500  08/15/19 1510  08/16/19 0402 08/16/19 0409 08/16/19 0430  08/16/19 0809 08/16/19 1025 08/16/19 1032  08/16/19 1236 08/16/19 1240 08/16/19 1456 08/16/19 1459  NA 135  134* 137   < > 136   < > 138   < > 139   < > 139   < > 139 138 139   < > 139 138 139 138 139 137 138  K 4.5  4.5 5.1   < > 4.6   < > 3.8   < > 3.9   < > 4.0   < > 4.0 4.0 4.1   < > 4.3 4.0 4.0 4.1 3.9 4.1 4.0  CL 97*  96* 101  --  95*  --  96*  --  92*  --  94*  --  91* 91* 93*  --   --   --   --   --   --   --   --   CO2 27  27 24   --  27  --  31  --  34*  --  33*  --   --   --  33*  --   --   --   --   --   --   --   --   GLUCOSE 113*  112* 119*  --  237*  --  196*  --  147*  --  144*  --  167* 139* 132*  --   --   --   --   --   --   --   --   BUN 44*  43* 48*  --  49*  --  53*  --  45*  --  38*  --  28* 32* 36*  --   --   --   --   --   --   --   --   CREATININE 1.86*  1.79* 2.48*  --  2.39*  --  2.28*  --  2.19*  --  1.96*  --  1.70* 2.20* 2.23*  --   --   --   --   --   --   --   --   CALCIUM 7.9*  7.9* 8.0*  --  7.6*  --  7.5*  --  7.7*  --  7.8*  --   --   --  7.8*  --   --   --   --   --   --   --   --   PHOS 3.4 6.5*  --  5.6*  --  3.3  --  3.3  --  3.1  --   --   --  3.3  --   --   --   --   --   --   --   --    < > = values in this interval not displayed.   CBC Recent Labs  Lab 08/13/19 0500  08/14/19 0530  08/15/19 0500  08/15/19 1510  08/16/19 0430  08/16/19 1236 08/16/19 1240 08/16/19 1456 08/16/19 1459  WBC 9.2   < > 10.8*  --  7.5  --  10.2  --  9.0  --   --   --   --   --   NEUTROABS 8.0*  --  10.1*  --  6.9  --   --   --  8.3*  --   --   --   --   --   HGB 6.6*   < > 7.9*   < > 7.3*   < > 8.6*   < > 8.3*   < > 8.8* 9.5* 8.8* 9.2*  HCT 21.7*   < > 25.4*   < > 23.1*   < > 26.9*   < > 26.6*   < > 26.0* 28.0* 26.0* 27.0*  MCV 101.4*   < > 98.4  --  96.3  --  95.7  --  96.0  --   --   --   --   --   PLT 118*   < > 130*  --  122*  --  149*  --  129*  --   --   --   --   --    < > = values in this interval not displayed.    Medications:    . amLODipine  10 mg Per Tube Daily  . chlorhexidine   15 mL Mouth/Throat BID  . Chlorhexidine Gluconate Cloth  6 each Topical Daily  . doxazosin  2 mg Per Tube Daily  . feeding supplement (PRO-STAT SUGAR FREE 64)  60 mL Per Tube TID  . heparin injection (subcutaneous)  7,500 Units Subcutaneous Q8H  . hydrALAZINE  25 mg Per Tube Q6H  . insulin aspart  0-9 Units  Subcutaneous Q4H  . mouth rinse  15 mL Mouth Rinse 10 times per day  . pantoprazole (PROTONIX) IV  40 mg Intravenous Q12H  . polyethylene glycol  17 g Per Tube QHS  . sennosides  5 mL Per Tube QHS  . sodium chloride HYPERTONIC  4 mL Nebulization Daily  . sucralfate  1 g Oral Q6H      Otelia Santee, MD 08/16/2019, 4:16 PM

## 2019-08-16 NOTE — Procedures (Signed)
PCCM Video Bronchoscopy Procedure Note  The patient was informed of the risks (including but not limited to bleeding, infection, respiratory failure, lung injury, tooth/oral injury) and benefits of the procedure and gave consent, see chart.  Indication: Acute respiratory failure with hypoxemia, COVID pneumonia, bronchopleural fistula with large air leak, endobronchial blocker in place  Post Procedure Diagnosis: Acute respiratory failure with hypoxemia, COVID pneumonia, bronchopleural fistula with large air leak, endobronchial blocker in place  [facility] ICU  Condition pre procedure: Critically ill, on vent  Medications for procedure: Fentanyl 15mcg IV push, versed 2mg  IV push  Procedure description: The bronchoscope was introduced through the endotracheal tube and passed to the bilateral lungs to the level of the subsegmental bronchi throughout the tracheobronchial tree.  Airway exam revealed copious thick yellow to grey secretions in the left mainstem and bronchus intermedius.  The endobronchial blocker was in place, no airway inflammation or necrosis near the balloon which was deflated.  Procedures performed: bronch washing left mainstem  Specimens sent: respiratory culture bacterial and fungal  Condition post procedure: critically ill, on ventilator  EBL: none from procedure  Complications: none  Roselie Awkward, MD [place] [facility] Pager: [phone] Cell: [phone] If no response, call [phone]

## 2019-08-16 NOTE — Progress Notes (Signed)
NAME:  [name], MRN:  [id number], DOB:  1951/05/27, LOS: 70 ADMISSION DATE:  07/25/2019, CONSULTATION DATE:  10/3 REFERRING MD:  Nadara Mustard, CHIEF COMPLAINT:  Dyspnea   Brief History   68 yo male dx with COVID 07/22/19 presented to [facility] ER 07/25/19 with progressive dyspnea and hypoxia requiring intubation from COVID 19 pneumonia.  Past Medical History  GERD  Significant Hospital Events   10/03 Admit to [facility] from [facility] ER, start decadron and remdesivir, given tociluzimab; prone positioning 10/04 convalescent plasma 10/05 stop prone positioning 10/06 convalescent plasma; prone positioning again 10/09 start ABx 10/10 MSSA bacteremia, CVL d/ced; ID consulted; increase OG tube outpt 10/11 vent weaning trial started 10/13 fever, pneumothorax, pig tail chest tube placed 10/14 worsening hypotension and renal fx >> consulted nephrology; worsening PTX >> replaced chest tube; GNR in sputum >> ABx changed 10/15 start CRRT; endobronchial blocker placed for persistent air leak 10/16 endobronchial blocker repositioned, changed chest tube to water seal; melana with ABLA >> GI consulted; transfuse PRBC 10/17 persistent air leak, increased WOB; start nimbex gtt >> air leak decreased; EGD; A fib with RVR >> start amiodarone 10/18 transfuse PRBC; GI s/o 10/20 resume heparin gtt 10/21 stopped nimbex, chest tube to suction, stopped heparin drip because Hgb dropped again 10/22 TPA in chest tube, repositioned endobronchial blocker 10/23 deflated endobronchial balloon 10/24 small air leak   Consults:  ID Nephrology Gastroenterology PCCM  Procedures:  10/2 ETT>  10/2 R IJ CVL > 10/10 10/13 Rt pig tail chest tube >> 10/14 10/14 R 40F chest tube >  10/14 R subclavian CVL >  10/14 L IJ HD cath >  10/14 Lt radial a line >   Significant Diagnostic Tests:  10/2 CT angiogram chest > extensive bilateral airspace disease predominantly posterior 10/8 doppler legs b/l > no DVT 10/15 renal u/s > normal  10/17 EGD > non bleeding gastric ulcer  10/19 Echo >> EF 60 to 65%, hyperdynamic LV with small LVOT gradient 10/22 CT chest > large collection of air in pleural space with fluid, pneumonia bilaterally R>L endobronchial blocker in place, chest tube anterior  Micro Data:  9/20 SARS COV 2 > POSITIVE 10/2 blood > negative 10/9 blood > MSSA 2/4 10/9 resp > MSSA, Pseudomonas 10/13 blood >> negative 10/21 bronch wash bacterial > pseduomonas, acinetobacter baumannii 10/21 bronch wash fungal >  10/21 bronch wash aspergillus antigen> negative 10/24 bronchoscopy wash>   Antimicrobials:  10/3 remdesivir > 10/6 10/3 actemra 10/3 decadron > 10/12 10/4 convalescent plasma 10/6 convalescent plasma  10/9 vancomycin > 10/10 10/9 cefepime > 10/10 10/10 ancef > 10/13 10/13 cefepime > 10/20 10/14 anidulofungin > 10/15  10/20 ancef > 10/23 10/23 meropenem >   10/21 anidulofungin> 10/22 10/22 voriconazole > 10/23  Interim history/subjective:   Small air leak Started on meropenem Fever yesterday Increased O2 requirements Opening eyes some Small air leak  Objective   Blood pressure 121/76, pulse 92, temperature 97.7 F (36.5 C), temperature source Esophageal, resp. rate (!) 25, height 5\' 10"  (1.778 m), weight 86.4 kg, SpO2 95 %. CVP:  [0 mmHg-12 mmHg] 5 mmHg  Vent Mode: PRVC FiO2 (%):  [40 %-60 %] 60 % Set Rate:  [24 bmp] 24 bmp Vt Set:  [480 mL] 480 mL PEEP:  [8 cmH20] 8 cmH20 Plateau Pressure:  [14 cmH20-22 cmH20] 22 cmH20   Intake/Output Summary (Last 24 hours) at 08/16/2019 0751 Last data filed at 08/16/2019 0700 Gross per 24 hour  Intake 2461.8 ml  Output 3971  ml  Net -1509.2 ml   Filed Weights   08/14/19 0500 08/15/19 0500 08/16/19 0500  Weight: 88.6 kg 88.7 kg 86.4 kg    Examination:  General:  In bed on vent HENT: NCAT ETT in place PULM: CTA B, vent supported breathing CV: RRR, no mgr GI: BS+, soft, nontender MSK: normal bulk and tone Neuro: sedated on vent    No air leak on chest tube today  10/23 CXR images personally reviewed: bilateral air space disease, chest tube in place, ETT in place, endobronchial blocker in place   Resolved Hospital Problem list     Assessment & Plan:  ARDS due to COVID-19 pneumonia: improving oxygenation, oxygenation a little worse 10/24 due to HCAP Continue full vent support Wean PEEP/FiO2 for SaO2 > 85%  Pneumothorax right lower lobe, small air leak 10/24 Empyema R Deflate endobronchial blocker balloon If air leak minimal and ABG OK 10/25 will remove blocker Bronchoscopy today to inspect balloon  Acute kidney injury: Continue CVVHD Monitor BMET and UOP Replace electrolytes as needed  Circulatory shock: resolved montior off vasopressors  Need for sedation/mechanical ventilation RASS target 0 to -1 Appears very weak on exam 10/22 Use prn only sedation to let him wake up Avoid paralytic use  Gastric ulcer: PPI  Anemia, acute hemorrhagic: Hgb down again on 10/23, spurious? Restart sub cutaneous heparin for DVT prophylaxis in next 24 hours Monitor for bleeding SCD  MSSA bacteremia Acinetobacter and Pseudomonal pneumonia F/U bronch wash from today Continue meropenem  Atrial fib : resolved? Tele Hold amiodarone  Best practice:  Diet: tube feeding Pain/Anxiety/Delirium protocol (if indicated): as above VAP protocol (if indicated): yes DVT prophylaxis: change hep infusion to prophylactic dosing GI prophylaxis: pantoprazole Glucose control: SSI Mobility: bed rest Code Status: DNR Family Communication: I called [name] today and gave her an update Disposition: remain in ICU  Labs   CBC: Recent Labs  Lab 08/12/19 0500  08/13/19 0500 08/13/19 1030  08/14/19 0530  08/15/19 0500  08/15/19 1510 08/15/19 1720 08/15/19 1721 08/15/19 2138 08/16/19 0430  WBC 8.3  --  9.2 11.9*  --  10.8*  --  7.5  --  10.2  --   --   --  9.0  NEUTROABS 6.7  --  8.0*  --   --  10.1*  --  6.9  --   --   --    --   --  8.3*  HGB 7.5*   < > 6.6* 6.8*   < > 7.9*   < > 7.3*   < > 8.6* 9.9* 9.2* 10.2* 8.3*  HCT 24.2*   < > 21.7* 22.1*   < > 25.4*   < > 23.1*   < > 26.9* 29.0* 27.0* 30.0* 26.6*  MCV 99.2  --  101.4* 101.8*  --  98.4  --  96.3  --  95.7  --   --   --  96.0  PLT 119*  --  118* 122*  --  130*  --  122*  --  149*  --   --   --  129*   < > = values in this interval not displayed.    Basic Metabolic Panel: Recent Labs  Lab 08/11/19 0500  08/12/19 0500  08/13/19 0500  08/14/19 0530  08/14/19 1715  08/15/19 0500  08/15/19 1510 08/15/19 1720 08/15/19 1721 08/15/19 2138 08/16/19 0430  NA 139  139   < > 140  140   < > 135  134*   < >  136   < > 138   < > 139   < > 139 139 137 139 139  K 4.5  4.5   < > 4.0  4.0   < > 4.5  4.5   < > 4.6   < > 3.8   < > 3.9   < > 4.0 3.9 4.0 3.8 4.1  CL 93*  93*   < > 95*  94*   < > 97*  96*   < > 95*  --  96*  --  92*  --  94*  --   --   --  93*  CO2 33*  34*   < > 33*  32   < > 27  27   < > 27  --  31  --  34*  --  33*  --   --   --  33*  GLUCOSE 130*  129*   < > 215*  216*   < > 113*  112*   < > 237*  --  196*  --  147*  --  144*  --   --   --  132*  BUN 66*  66*   < > 45*  46*   < > 44*  43*   < > 49*  --  53*  --  45*  --  38*  --   --   --  36*  CREATININE 2.49*  2.50*   < > 1.89*  1.91*   < > 1.86*  1.79*   < > 2.39*  --  2.28*  --  2.19*  --  1.96*  --   --   --  2.23*  CALCIUM 9.3  9.3   < > 7.3*  7.3*   < > 7.9*  7.9*   < > 7.6*  --  7.5*  --  7.7*  --  7.8*  --   --   --  7.8*  MG 2.3  --  2.1  --  2.4  --  2.3  --   --   --  2.1  --   --   --   --   --   --   PHOS 5.0*  4.8*   < > 2.6   < > 3.4   < > 5.6*  --  3.3  --  3.3  --  3.1  --   --   --  3.3   < > = values in this interval not displayed.   GFR: Estimated Creatinine Clearance: 33.6 mL/min (A) (by C-G formula based on SCr of 2.23 mg/dL (H)). Recent Labs  Lab 08/14/19 0530 08/15/19 0500 08/15/19 1510 08/16/19 0430  WBC 10.8* 7.5 10.2 9.0    Liver  Function Tests: Recent Labs  Lab 08/12/19 0500  08/13/19 0500  08/14/19 0530 08/14/19 1715 08/15/19 0500 08/15/19 1510 08/16/19 0430  AST 32  --  24  --  25  --  29  --  38  ALT 9  --  9  --  8  --  6  --  8  ALKPHOS 63  --  52  --  61  --  59  --  71  BILITOT 0.5  --  0.5  --  0.8  --  0.3  --  0.6  PROT 5.9*  --  6.2*  --  6.2*  --  6.0*  --  6.2*  ALBUMIN 2.1*  2.1*   < >  2.8*  2.8*   < > 2.4*  2.4* 2.2* 2.2*  2.2* 2.4* 2.1*  2.2*   < > = values in this interval not displayed.   No results for input(s): LIPASE, AMYLASE in the last 168 hours. No results for input(s): AMMONIA in the last 168 hours.  ABG    Component Value Date/Time   PHART 7.570 (H) 08/15/2019 1721   PCO2ART 41.6 08/15/2019 1721   PO2ART 55.0 (L) 08/15/2019 1721   HCO3 34.1 (H) 08/15/2019 2138   TCO2 35 (H) 08/15/2019 2138   ACIDBASEDEF 2.0 08/14/2019 0515   O2SAT 67.0 08/15/2019 2138     Coagulation Profile: No results for input(s): INR, PROTIME in the last 168 hours.  Cardiac Enzymes: No results for input(s): CKTOTAL, CKMB, CKMBINDEX, TROPONINI in the last 168 hours.  HbA1C: Hgb A1c MFr Bld  Date/Time Value Ref Range Status  07/26/2019 05:00 AM 6.1 (H) 4.8 - 5.6 % Final    Comment:    (NOTE) Pre diabetes:          5.7%-6.4% Diabetes:              >6.4% Glycemic control for   <7.0% adults with diabetes     CBG: Recent Labs  Lab 08/15/19 1159 08/15/19 1537 08/15/19 1943 08/16/19 0000 08/16/19 0426  GLUCAP 174* 182* 200* 100* 167*       Critical care time: 35 minutes      Roselie Awkward, MD [facility] Pager: [phone] Cell: [phone] If no response, call [phone]

## 2019-08-16 NOTE — Progress Notes (Addendum)
[name]  PTW:[id number] DOB: 1951/05/31 DOA: 07/25/2019 PCP: Kathyrn Drown, MD    Brief Narrative:  68 year old with a history of GERD and BPH who presented to [facility], ED with S OB and was found to be hypoxic.  He was initially diagnosed with COVID-19 September 20.  1 to 2 days prior to his presentation he developed worsening shortness of breath with anorexia.  His wife's physician during a teleconference call noted that the patient himself is very tachypneic and ultimately EMS was sent to the house.  EMS found the patient to have saturations in the 40s.  In the ED on nonrebreather the patient sats only improved to the 80s.  The patient was severely confused and required emergent intubation.  Chest x-ray confirmed multifocal infiltrates.  CT chest was negative for PE  Significant Events: 10/3 admit to [facility] from [facility] ED 10/13 right pneumothorax -pigtail chest tube placed 10/14 Nephrology consulted 10/15 begin CRRT -endobronchial blocker placed 10/16 melena 10/17 A. fib with RVR -EGD 10/18 transfuse PRBC 10/20 Heparin drip resumed 10/21 abrupt drop in hemoglobin -transfuse 1 unit PRBC 10/23 tPA per chest tube   COVID-19 specific Treatment: Decadron 10/2 > 10/12 Remdesivir 10/2 > 10/6 Actemra 10/2 Convalescent plasma 10/3  Subjective: Noncommunicative.  Sedated on ventilator.   Assessment & Plan:  Covid pneumonia - acute hypoxic respiratory failure Has completed a course of decadron and remdesivir - was dosed with actemra and convalescent plasma - pulmonary status remains very complicated and tenuous - ventilator management per PCCM  Recent Labs  Lab 08/11/19 0500 08/12/19 0500 08/13/19 0500 08/14/19 0530 08/15/19 0500 08/16/19 0430  DDIMER >20.00* 11.03* 5.27* 7.45* 8.83*  --   CRP 4.2* 5.5* 9.8* 15.8* 10.5*  --   ALT _0 Pseudomonas and Acinetobacter in BAL specimen Changed abx to meropenem to cover -afebrile at this time  Acute renal  failure CRRT dependent - Nephrology following.  GI bleed - gastric ulcer - acute blood loss anemia Status post EGD with clipping of vessel in large ulcer - full anticoagulation was resumed 10/20 following which he experienced an abrupt drop in his hemoglobin - he was transfused 1 unit 10/21 - anticoagulation stopped 10/21 -follow CBC check suggestibility -continue to follow intermittently, especially with resumption of DVT prophylaxis today  Recent Labs  Lab 08/16/19 1032 08/16/19 1236 08/16/19 1240  HGB 9.2* 8.8* 9.5*    Markedly elevated D-dimer signif increase noted 10/19 - unable to fully anticoag at present due to concern for GIB - no evidence of DVT on dopplers 10/8 - likely related to suspected empyema right lung  Pneumothorax right - empyema CT chest 10/21 noted findings most consistent with an empyema as well as a persisting pneumothorax - bedside bronchoscopy revealed significant material in the airway -TPA infused via chest tube 10/23 - chest tube management per PCCM  Acute transient atrial fibrillation with RVR No known prior history of atrial fibrillation -currently in NSR -unable to fully anticoagulate due to possible recurrent GI bleeding -continue to follow on telemetry  MSSA bacteremia - septic shock - hypotension abx tx continues -remains hemodynamically stable  HTN Blood pressure reasonably controlled at this time -follow without change today  BPH  DVT prophylaxis: Subcutaneous heparin Code Status: NO CODE - DNR Family Communication: per PCCM   Disposition Plan: ICU  Consultants:  PCCM Nephrology GI  Antimicrobials:  10/9 vancomycin > 10/10 10/9 cefepime > 10/10 10/10 ancef > 10/13 10/13 cefepime >  10/20 10/14 anidulofungin > 10/15 Cefazolin 10/20 > 10/23 Meropenem 10/23 >  Objective: Blood pressure (!) 116/49, pulse (!) 120, temperature 98.1 F (36.7 C), temperature source Esophageal, resp. rate (!) 35, height _0  (1.778 m), weight 86.4 kg,  SpO2 95 %.  Intake/Output Summary (Last 24 hours) at 08/16/2019 1434 Last data filed at 08/16/2019 1303 Gross per 24 hour  Intake 3208.18 ml  Output 3335 ml  Net -126.82 ml   Filed Weights   08/14/19 0500 08/15/19 0500 08/16/19 0500  Weight: 88.6 kg 88.7 kg 86.4 kg    Examination: General: Sedated on ventilator -no apparent agitation Lungs: No air movement right base -fine crackles throughout other fields without wheezing Cardiovascular: Mild tachycardia but regular without murmur Abdomen: Soft, BS positive, no appreciable mass Extremities: Trace edema B LE   CBC: Recent Labs  Lab 08/14/19 0530  08/15/19 0500  08/15/19 1510  08/16/19 0430  08/16/19 1032 08/16/19 1236 08/16/19 1240  WBC 10.8*  --  7.5  --  10.2  --  9.0  --   --   --   --   NEUTROABS 10.1*  --  6.9  --   --   --  8.3*  --   --   --   --   HGB 7.9*   < > 7.3*   < > 8.6*   < > 8.3*   < > 9.2* 8.8* 9.5*  HCT 25.4*   < > 23.1*   < > 26.9*   < > 26.6*   < > 27.0* 26.0* 28.0*  MCV 98.4  --  96.3  --  95.7  --  96.0  --   --   --   --   PLT 130*  --  122*  --  149*  --  129*  --   --   --   --    < > = values in this interval not displayed.   Basic Metabolic Panel: Recent Labs  Lab 08/13/19 0500  08/14/19 0530  08/15/19 0500  08/15/19 1510  08/16/19 0402 08/16/19 0409 08/16/19 0430  08/16/19 1032 08/16/19 1236 08/16/19 1240  NA 135  134*   < > 136   < > 139   < > 139   < > 139 138 139   < > 139 138 139  K 4.5  4.5   < > 4.6   < > 3.9   < > 4.0   < > 4.0 4.0 4.1   < > 4.0 4.1 3.9  CL 97*  96*   < > 95*   < > 92*  --  94*  --  91* 91* 93*  --   --   --   --   CO2 27  27   < > 27   < > 34*  --  33*  --   --   --  33*  --   --   --   --   GLUCOSE 113*  112*   < > 237*   < > 147*  --  144*  --  167* 139* 132*  --   --   --   --   BUN 44*  43*   < > 49*   < > 45*  --  38*  --  28* 32* 36*  --   --   --   --   CREATININE 1.86*  1.79*   < > 2.39*   < >  2.19*  --  1.96*  --  1.70* 2.20* 2.23*  --   --    --   --   CALCIUM 7.9*  7.9*   < > 7.6*   < > 7.7*  --  7.8*  --   --   --  7.8*  --   --   --   --   MG 2.4  --  2.3  --  2.1  --   --   --   --   --   --   --   --   --   --   PHOS 3.4   < > 5.6*   < > 3.3  --  3.1  --   --   --  3.3  --   --   --   --    < > = values in this interval not displayed.   GFR: Estimated Creatinine Clearance: 33.6 mL/min (A) (by C-G formula based on SCr of 2.23 mg/dL (H)).  Liver Function Tests: Recent Labs  Lab 08/13/19 0500  08/14/19 0530 08/14/19 1715 08/15/19 0500 08/15/19 1510 08/16/19 0430  AST 24  --  25  --  29  --  38  ALT 9  --  8  --  6  --  8  ALKPHOS 52  --  61  --  59  --  71  BILITOT 0.5  --  0.8  --  0.3  --  0.6  PROT 6.2*  --  6.2*  --  6.0*  --  6.2*  ALBUMIN 2.8*  2.8*   < > 2.4*  2.4* 2.2* 2.2*  2.2* 2.4* 2.1*  2.2*   < > = values in this interval not displayed.    HbA1C: Hgb A1c MFr Bld  Date/Time Value Ref Range Status  07/26/2019 05:00 AM 6.1 (H) 4.8 - 5.6 % Final    Comment:    (NOTE) Pre diabetes:          5.7%-6.4% Diabetes:              >6.4% Glycemic control for   <7.0% adults with diabetes     CBG: Recent Labs  Lab 08/15/19 1943 08/16/19 0000 08/16/19 0426 08/16/19 0807 08/16/19 1152  GLUCAP 200* 100* 167* 168* 139*    Recent Results (from the past 240 hour(s))  Culture, respiratory (non-expectorated)     Status: None   Collection Time: 08/13/19 11:30 AM   Specimen: Bronchoalveolar Lavage; Respiratory  Result Value Ref Range Status   Specimen Description   Final    BRONCHIAL ALVEOLAR LAVAGE Performed at [facility], [address], [place], [place] [address]    Special Requests   Final    Normal Performed at [facility] [address] [place], [place] [address]    Gram Stain   Final    MODERATE WBC PRESENT,BOTH PMN AND MONONUCLEAR NO ORGANISMS SEEN Performed at [facility], [place] [address], [place], [place] [address]    Culture   Final    FEW  PSEUDOMONAS AERUGINOSA FEW ACINETOBACTER CALCOACETICUS/BAUMANNII COMPLEX    Report Status 08/15/2019 FINAL  Final   Organism ID, Bacteria PSEUDOMONAS AERUGINOSA  Final   Organism ID, Bacteria ACINETOBACTER CALCOACETICUS/BAUMANNII COMPLEX  Final      Susceptibility   Acinetobacter calcoaceticus/baumannii complex - MIC*    CEFTAZIDIME 8 SENSITIVE Sensitive     CEFTRIAXONE 16 INTERMEDIATE Intermediate     CIPROFLOXACIN 0.5  SENSITIVE Sensitive     GENTAMICIN 4 SENSITIVE Sensitive     IMIPENEM <=0.25 SENSITIVE Sensitive     PIP/TAZO 16 SENSITIVE Sensitive     TRIMETH/SULFA <=20 SENSITIVE Sensitive     CEFEPIME 16 INTERMEDIATE Intermediate     AMPICILLIN/SULBACTAM <=2 SENSITIVE Sensitive     * FEW ACINETOBACTER CALCOACETICUS/BAUMANNII COMPLEX   Pseudomonas aeruginosa - MIC*    CEFTAZIDIME 4 SENSITIVE Sensitive     CIPROFLOXACIN <=0.25 SENSITIVE Sensitive     GENTAMICIN <=1 SENSITIVE Sensitive     IMIPENEM 1 SENSITIVE Sensitive     PIP/TAZO 8 SENSITIVE Sensitive     CEFEPIME <=1 SENSITIVE Sensitive     * FEW PSEUDOMONAS AERUGINOSA  Fungus Culture With Stain     Status: None   Collection Time: 08/13/19 11:30 AM  Result Value Ref Range Status   Fungus Stain Final report  Final    Comment: (NOTE) Performed At: [facility] [place], [place] [id number] Rush Farmer MD ON:[id number]    Fungus (Mycology) Culture PENDING  Incomplete   Fungal Source BRONCHIAL ALVEOLAR LAVAGE  Corrected    Comment: Performed at [facility], [place] [address] [place], [place] [address] CORRECTED ON 10/21 AT 1659: PREVIOUSLY REPORTED AS TRACHEAL ASPIRATE   Aspergillus Ag, BAL/Serum     Status: None   Collection Time: 08/13/19 11:30 AM  Result Value Ref Range Status   Aspergillus Ag, BAL/Serum 0.06 0.00 - 0.49 Index Final    Comment: (NOTE) Performed At: [facility] [address] [place], [place] [id number] Rush Farmer MD IH:[id number] Performed At: [facility]  [place], [place] [id number] Katina Degree MDPhD JO:[id number]   Fungus Culture Result     Status: None   Collection Time: 08/13/19 11:30 AM  Result Value Ref Range Status   Result 1 Comment  Final    Comment: (NOTE) KOH/Calcofluor preparation:  no fungus observed. Performed At: [facility] [place], [place] [id number] Rush Farmer MD TD:[id number]   Expectorated sputum assessment w rflx to resp cult     Status: None   Collection Time: 08/16/19  9:25 AM   Specimen: Expectorated Sputum  Result Value Ref Range Status   Specimen Description EXPECTORATED SPUTUM  Final   Special Requests Normal  Final   Sputum evaluation   Final    THIS SPECIMEN IS ACCEPTABLE FOR SPUTUM CULTURE Performed at [facility], [place] [address] [place], [place] [address]    Report Status 08/16/2019 FINAL  Final     Scheduled Meds: . amLODipine  10 mg Per Tube Daily  . chlorhexidine  15 mL Mouth/Throat BID  . Chlorhexidine Gluconate Cloth  6 each Topical Daily  . doxazosin  2 mg Per Tube Daily  . feeding supplement (PRO-STAT SUGAR FREE 64)  60 mL Per Tube TID  . hydrALAZINE  25 mg Per Tube Q6H  . insulin aspart  0-9 Units Subcutaneous Q4H  . mouth rinse  15 mL Mouth Rinse 10 times per day  . pantoprazole (PROTONIX) IV  40 mg Intravenous Q12H  . polyethylene glycol  17 g Per Tube QHS  . sennosides  5 mL Per Tube QHS  . sodium chloride HYPERTONIC  4 mL Nebulization Daily  . sucralfate  1 g Oral Q6H   Continuous Infusions: .  prismasol BGK 4/2.5 200 mL/hr at 08/15/19 2009  . sodium chloride Stopped (08/16/19 0431)  . sodium chloride 5 mL/hr at 08/16/19 1300  .  calcium gluconate infusion for CRRT 60 mL/hr at 08/16/19 1300  . dextrose 40 mL/hr at 08/16/19 1300  . feeding supplement (PIVOT 1.5 CAL) 1,000 mL (08/16/19 1214)  . meropenem (MERREM) IV Stopped (08/16/19 0444)  . norepinephrine (LEVOPHED) Adult infusion Stopped (08/15/19 2110)  .  prismasol B22GK 4/0 1,800 mL/hr at 08/16/19 1340  . sodium citrate 2 %/dextrose 2.5% solution 3000 mL 350 mL/hr at 08/16/19 1243     LOS: 22 days   Cherene Altes, MD [facility] Office  [phone] Pager - Text Page per Amion  If 7PM-7AM, please contact night-coverage per Amion 08/16/2019, 2:34 PM

## 2019-08-16 NOTE — Progress Notes (Signed)
Called pt's wife to update of pt condition and plan of care. Pt's wife appreciative of update.

## 2019-08-16 NOTE — Progress Notes (Signed)
eLink Physician-Brief Progress Note Patient Name: [name] DOB: 1951-03-02 MRN: [id number]   Date of Service  08/16/2019  HPI/Events of Note  Notified of vent desynchrony with desaturation. BP 180/87. Given Fentanyl 100 and Versed 2. RASS -2  eICU Interventions  Ordered one time dose of vecuronium 10 mg     Intervention Category Intermediate Interventions: Respiratory distress - evaluation and management  Judd Lien 08/16/2019, 12:34 AM

## 2019-08-17 ENCOUNTER — Inpatient Hospital Stay (HOSPITAL_COMMUNITY): Payer: Medicare HMO

## 2019-08-17 DIAGNOSIS — I959 Hypotension, unspecified: Secondary | ICD-10-CM | POA: Diagnosis not present

## 2019-08-17 DIAGNOSIS — U071 COVID-19: Secondary | ICD-10-CM | POA: Diagnosis not present

## 2019-08-17 DIAGNOSIS — R579 Shock, unspecified: Secondary | ICD-10-CM | POA: Diagnosis not present

## 2019-08-17 DIAGNOSIS — I4891 Unspecified atrial fibrillation: Secondary | ICD-10-CM | POA: Diagnosis not present

## 2019-08-17 DIAGNOSIS — E872 Acidosis: Secondary | ICD-10-CM | POA: Diagnosis not present

## 2019-08-17 DIAGNOSIS — J1289 Other viral pneumonia: Secondary | ICD-10-CM | POA: Diagnosis not present

## 2019-08-17 DIAGNOSIS — D649 Anemia, unspecified: Secondary | ICD-10-CM | POA: Diagnosis not present

## 2019-08-17 DIAGNOSIS — J9601 Acute respiratory failure with hypoxia: Secondary | ICD-10-CM | POA: Diagnosis not present

## 2019-08-17 DIAGNOSIS — J9600 Acute respiratory failure, unspecified whether with hypoxia or hypercapnia: Secondary | ICD-10-CM | POA: Diagnosis not present

## 2019-08-17 DIAGNOSIS — N179 Acute kidney failure, unspecified: Secondary | ICD-10-CM | POA: Diagnosis not present

## 2019-08-17 DIAGNOSIS — J939 Pneumothorax, unspecified: Secondary | ICD-10-CM | POA: Diagnosis not present

## 2019-08-17 DIAGNOSIS — A4901 Methicillin susceptible Staphylococcus aureus infection, unspecified site: Secondary | ICD-10-CM | POA: Diagnosis not present

## 2019-08-17 LAB — POCT I-STAT 7, (LYTES, BLD GAS, ICA,H+H)
Acid-Base Excess: 16 mmol/L — ABNORMAL HIGH (ref 0.0–2.0)
Bicarbonate: 40.8 mmol/L — ABNORMAL HIGH (ref 20.0–28.0)
Calcium, Ion: 1.04 mmol/L — ABNORMAL LOW (ref 1.15–1.40)
HCT: 21 % — ABNORMAL LOW (ref 39.0–52.0)
Hemoglobin: 7.1 g/dL — ABNORMAL LOW (ref 13.0–17.0)
O2 Saturation: 97 %
Patient temperature: 36.6
Potassium: 3.7 mmol/L (ref 3.5–5.1)
Sodium: 138 mmol/L (ref 135–145)
TCO2: 42 mmol/L — ABNORMAL HIGH (ref 22–32)
pCO2 arterial: 48.7 mmHg — ABNORMAL HIGH (ref 32.0–48.0)
pH, Arterial: 7.529 — ABNORMAL HIGH (ref 7.350–7.450)
pO2, Arterial: 83 mmHg (ref 83.0–108.0)

## 2019-08-17 LAB — CBC WITH DIFFERENTIAL/PLATELET
Abs Immature Granulocytes: 0.05 10*3/uL (ref 0.00–0.07)
Basophils Absolute: 0 10*3/uL (ref 0.0–0.1)
Basophils Relative: 0 %
Eosinophils Absolute: 0.2 10*3/uL (ref 0.0–0.5)
Eosinophils Relative: 2 %
HCT: 23.8 % — ABNORMAL LOW (ref 39.0–52.0)
Hemoglobin: 7.5 g/dL — ABNORMAL LOW (ref 13.0–17.0)
Immature Granulocytes: 1 %
Lymphocytes Relative: 2 %
Lymphs Abs: 0.2 10*3/uL — ABNORMAL LOW (ref 0.7–4.0)
MCH: 30.1 pg (ref 26.0–34.0)
MCHC: 31.5 g/dL (ref 30.0–36.0)
MCV: 95.6 fL (ref 80.0–100.0)
Monocytes Absolute: 0.1 10*3/uL (ref 0.1–1.0)
Monocytes Relative: 2 %
Neutro Abs: 7.7 10*3/uL (ref 1.7–7.7)
Neutrophils Relative %: 93 %
Platelets: 127 10*3/uL — ABNORMAL LOW (ref 150–400)
RBC: 2.49 MIL/uL — ABNORMAL LOW (ref 4.22–5.81)
RDW: 15.9 % — ABNORMAL HIGH (ref 11.5–15.5)
WBC: 8.2 10*3/uL (ref 4.0–10.5)
nRBC: 0.4 % — ABNORMAL HIGH (ref 0.0–0.2)

## 2019-08-17 LAB — POCT I-STAT, CHEM 8
BUN: 34 mg/dL — ABNORMAL HIGH (ref 8–23)
BUN: 39 mg/dL — ABNORMAL HIGH (ref 8–23)
BUN: 40 mg/dL — ABNORMAL HIGH (ref 8–23)
BUN: 33 mg/dL — ABNORMAL HIGH (ref 8–23)
BUN: 39 mg/dL — ABNORMAL HIGH (ref 8–23)
BUN: 33 mg/dL — ABNORMAL HIGH (ref 8–23)
BUN: 36 mg/dL — ABNORMAL HIGH (ref 8–23)
BUN: 38 mg/dL — ABNORMAL HIGH (ref 8–23)
BUN: 39 mg/dL — ABNORMAL HIGH (ref 8–23)
BUN: 36 mg/dL — ABNORMAL HIGH (ref 8–23)
BUN: 41 mg/dL — ABNORMAL HIGH (ref 8–23)
BUN: 42 mg/dL — ABNORMAL HIGH (ref 8–23)
BUN: 33 mg/dL — ABNORMAL HIGH (ref 8–23)
BUN: 35 mg/dL — ABNORMAL HIGH (ref 8–23)
BUN: 41 mg/dL — ABNORMAL HIGH (ref 8–23)
BUN: 42 mg/dL — ABNORMAL HIGH (ref 8–23)
Calcium, Ion: 0.37 mmol/L — CL (ref 1.15–1.40)
Calcium, Ion: 1.02 mmol/L — ABNORMAL LOW (ref 1.15–1.40)
Calcium, Ion: 1.02 mmol/L — ABNORMAL LOW (ref 1.15–1.40)
Calcium, Ion: 0.41 mmol/L — CL (ref 1.15–1.40)
Calcium, Ion: 1 mmol/L — ABNORMAL LOW (ref 1.15–1.40)
Calcium, Ion: 0.41 mmol/L — CL (ref 1.15–1.40)
Calcium, Ion: 0.48 mmol/L — CL (ref 1.15–1.40)
Calcium, Ion: 1 mmol/L — ABNORMAL LOW (ref 1.15–1.40)
Calcium, Ion: 1.03 mmol/L — ABNORMAL LOW (ref 1.15–1.40)
Calcium, Ion: 0.42 mmol/L — CL (ref 1.15–1.40)
Calcium, Ion: 1.04 mmol/L — ABNORMAL LOW (ref 1.15–1.40)
Calcium, Ion: 1.05 mmol/L — ABNORMAL LOW (ref 1.15–1.40)
Calcium, Ion: 0.4 mmol/L — CL (ref 1.15–1.40)
Calcium, Ion: 0.42 mmol/L — CL (ref 1.15–1.40)
Calcium, Ion: 1 mmol/L — ABNORMAL LOW (ref 1.15–1.40)
Calcium, Ion: 1.02 mmol/L — ABNORMAL LOW (ref 1.15–1.40)
Chloride: 90 mmol/L — ABNORMAL LOW (ref 98–111)
Chloride: 92 mmol/L — ABNORMAL LOW (ref 98–111)
Chloride: 95 mmol/L — ABNORMAL LOW (ref 98–111)
Chloride: 90 mmol/L — ABNORMAL LOW (ref 98–111)
Chloride: 92 mmol/L — ABNORMAL LOW (ref 98–111)
Chloride: 90 mmol/L — ABNORMAL LOW (ref 98–111)
Chloride: 90 mmol/L — ABNORMAL LOW (ref 98–111)
Chloride: 92 mmol/L — ABNORMAL LOW (ref 98–111)
Chloride: 93 mmol/L — ABNORMAL LOW (ref 98–111)
Chloride: 88 mmol/L — ABNORMAL LOW (ref 98–111)
Chloride: 91 mmol/L — ABNORMAL LOW (ref 98–111)
Chloride: 92 mmol/L — ABNORMAL LOW (ref 98–111)
Chloride: 88 mmol/L — ABNORMAL LOW (ref 98–111)
Chloride: 89 mmol/L — ABNORMAL LOW (ref 98–111)
Chloride: 90 mmol/L — ABNORMAL LOW (ref 98–111)
Chloride: 90 mmol/L — ABNORMAL LOW (ref 98–111)
Creatinine, Ser: 1.6 mg/dL — ABNORMAL HIGH (ref 0.61–1.24)
Creatinine, Ser: 2 mg/dL — ABNORMAL HIGH (ref 0.61–1.24)
Creatinine, Ser: 2 mg/dL — ABNORMAL HIGH (ref 0.61–1.24)
Creatinine, Ser: 1.6 mg/dL — ABNORMAL HIGH (ref 0.61–1.24)
Creatinine, Ser: 2.1 mg/dL — ABNORMAL HIGH (ref 0.61–1.24)
Creatinine, Ser: 1.6 mg/dL — ABNORMAL HIGH (ref 0.61–1.24)
Creatinine, Ser: 1.6 mg/dL — ABNORMAL HIGH (ref 0.61–1.24)
Creatinine, Ser: 1.9 mg/dL — ABNORMAL HIGH (ref 0.61–1.24)
Creatinine, Ser: 2 mg/dL — ABNORMAL HIGH (ref 0.61–1.24)
Creatinine, Ser: 1.7 mg/dL — ABNORMAL HIGH (ref 0.61–1.24)
Creatinine, Ser: 2.1 mg/dL — ABNORMAL HIGH (ref 0.61–1.24)
Creatinine, Ser: 2.1 mg/dL — ABNORMAL HIGH (ref 0.61–1.24)
Creatinine, Ser: 1.5 mg/dL — ABNORMAL HIGH (ref 0.61–1.24)
Creatinine, Ser: 1.6 mg/dL — ABNORMAL HIGH (ref 0.61–1.24)
Creatinine, Ser: 2 mg/dL — ABNORMAL HIGH (ref 0.61–1.24)
Creatinine, Ser: 2 mg/dL — ABNORMAL HIGH (ref 0.61–1.24)
Glucose, Bld: 193 mg/dL — ABNORMAL HIGH (ref 70–99)
Glucose, Bld: 193 mg/dL — ABNORMAL HIGH (ref 70–99)
Glucose, Bld: 250 mg/dL — ABNORMAL HIGH (ref 70–99)
Glucose, Bld: 177 mg/dL — ABNORMAL HIGH (ref 70–99)
Glucose, Bld: 216 mg/dL — ABNORMAL HIGH (ref 70–99)
Glucose, Bld: 175 mg/dL — ABNORMAL HIGH (ref 70–99)
Glucose, Bld: 187 mg/dL — ABNORMAL HIGH (ref 70–99)
Glucose, Bld: 225 mg/dL — ABNORMAL HIGH (ref 70–99)
Glucose, Bld: 228 mg/dL — ABNORMAL HIGH (ref 70–99)
Glucose, Bld: 198 mg/dL — ABNORMAL HIGH (ref 70–99)
Glucose, Bld: 199 mg/dL — ABNORMAL HIGH (ref 70–99)
Glucose, Bld: 245 mg/dL — ABNORMAL HIGH (ref 70–99)
Glucose, Bld: 183 mg/dL — ABNORMAL HIGH (ref 70–99)
Glucose, Bld: 186 mg/dL — ABNORMAL HIGH (ref 70–99)
Glucose, Bld: 218 mg/dL — ABNORMAL HIGH (ref 70–99)
Glucose, Bld: 236 mg/dL — ABNORMAL HIGH (ref 70–99)
HCT: 26 % — ABNORMAL LOW (ref 39.0–52.0)
HCT: 28 % — ABNORMAL LOW (ref 39.0–52.0)
HCT: 50 % (ref 39.0–52.0)
HCT: 24 % — ABNORMAL LOW (ref 39.0–52.0)
HCT: 26 % — ABNORMAL LOW (ref 39.0–52.0)
HCT: 26 % — ABNORMAL LOW (ref 39.0–52.0)
HCT: 26 % — ABNORMAL LOW (ref 39.0–52.0)
HCT: 26 % — ABNORMAL LOW (ref 39.0–52.0)
HCT: 28 % — ABNORMAL LOW (ref 39.0–52.0)
HCT: 26 % — ABNORMAL LOW (ref 39.0–52.0)
HCT: 28 % — ABNORMAL LOW (ref 39.0–52.0)
HCT: 33 % — ABNORMAL LOW (ref 39.0–52.0)
HCT: 25 % — ABNORMAL LOW (ref 39.0–52.0)
HCT: 26 % — ABNORMAL LOW (ref 39.0–52.0)
HCT: 26 % — ABNORMAL LOW (ref 39.0–52.0)
HCT: 26 % — ABNORMAL LOW (ref 39.0–52.0)
Hemoglobin: 17 g/dL (ref 13.0–17.0)
Hemoglobin: 8.8 g/dL — ABNORMAL LOW (ref 13.0–17.0)
Hemoglobin: 9.5 g/dL — ABNORMAL LOW (ref 13.0–17.0)
Hemoglobin: 8.2 g/dL — ABNORMAL LOW (ref 13.0–17.0)
Hemoglobin: 8.8 g/dL — ABNORMAL LOW (ref 13.0–17.0)
Hemoglobin: 8.8 g/dL — ABNORMAL LOW (ref 13.0–17.0)
Hemoglobin: 8.8 g/dL — ABNORMAL LOW (ref 13.0–17.0)
Hemoglobin: 8.8 g/dL — ABNORMAL LOW (ref 13.0–17.0)
Hemoglobin: 9.5 g/dL — ABNORMAL LOW (ref 13.0–17.0)
Hemoglobin: 11.2 g/dL — ABNORMAL LOW (ref 13.0–17.0)
Hemoglobin: 8.8 g/dL — ABNORMAL LOW (ref 13.0–17.0)
Hemoglobin: 9.5 g/dL — ABNORMAL LOW (ref 13.0–17.0)
Hemoglobin: 8.5 g/dL — ABNORMAL LOW (ref 13.0–17.0)
Hemoglobin: 8.8 g/dL — ABNORMAL LOW (ref 13.0–17.0)
Hemoglobin: 8.8 g/dL — ABNORMAL LOW (ref 13.0–17.0)
Hemoglobin: 8.8 g/dL — ABNORMAL LOW (ref 13.0–17.0)
Potassium: 3.8 mmol/L (ref 3.5–5.1)
Potassium: 3.8 mmol/L (ref 3.5–5.1)
Potassium: 3.8 mmol/L (ref 3.5–5.1)
Potassium: 3.8 mmol/L (ref 3.5–5.1)
Potassium: 4 mmol/L (ref 3.5–5.1)
Potassium: 3.7 mmol/L (ref 3.5–5.1)
Potassium: 3.7 mmol/L (ref 3.5–5.1)
Potassium: 3.8 mmol/L (ref 3.5–5.1)
Potassium: 3.8 mmol/L (ref 3.5–5.1)
Potassium: 3.6 mmol/L (ref 3.5–5.1)
Potassium: 3.7 mmol/L (ref 3.5–5.1)
Potassium: 3.9 mmol/L (ref 3.5–5.1)
Potassium: 3.6 mmol/L (ref 3.5–5.1)
Potassium: 3.7 mmol/L (ref 3.5–5.1)
Potassium: 3.7 mmol/L (ref 3.5–5.1)
Potassium: 3.8 mmol/L (ref 3.5–5.1)
Sodium: 138 mmol/L (ref 135–145)
Sodium: 139 mmol/L (ref 135–145)
Sodium: 139 mmol/L (ref 135–145)
Sodium: 137 mmol/L (ref 135–145)
Sodium: 140 mmol/L (ref 135–145)
Sodium: 139 mmol/L (ref 135–145)
Sodium: 139 mmol/L (ref 135–145)
Sodium: 140 mmol/L (ref 135–145)
Sodium: 140 mmol/L (ref 135–145)
Sodium: 139 mmol/L (ref 135–145)
Sodium: 139 mmol/L (ref 135–145)
Sodium: 141 mmol/L (ref 135–145)
Sodium: 139 mmol/L (ref 135–145)
Sodium: 139 mmol/L (ref 135–145)
Sodium: 141 mmol/L (ref 135–145)
Sodium: 141 mmol/L (ref 135–145)
TCO2: 29 mmol/L (ref 22–32)
TCO2: 29 mmol/L (ref 22–32)
TCO2: 31 mmol/L (ref 22–32)
TCO2: 30 mmol/L (ref 22–32)
TCO2: 36 mmol/L — ABNORMAL HIGH (ref 22–32)
TCO2: 32 mmol/L (ref 22–32)
TCO2: 33 mmol/L — ABNORMAL HIGH (ref 22–32)
TCO2: 33 mmol/L — ABNORMAL HIGH (ref 22–32)
TCO2: 33 mmol/L — ABNORMAL HIGH (ref 22–32)
TCO2: 31 mmol/L (ref 22–32)
TCO2: 34 mmol/L — ABNORMAL HIGH (ref 22–32)
TCO2: 36 mmol/L — ABNORMAL HIGH (ref 22–32)
TCO2: 30 mmol/L (ref 22–32)
TCO2: 32 mmol/L (ref 22–32)
TCO2: 35 mmol/L — ABNORMAL HIGH (ref 22–32)
TCO2: 35 mmol/L — ABNORMAL HIGH (ref 22–32)

## 2019-08-17 LAB — RENAL FUNCTION PANEL
Albumin: 2 g/dL — ABNORMAL LOW (ref 3.5–5.0)
Albumin: 1.8 g/dL — ABNORMAL LOW (ref 3.5–5.0)
Anion gap: 14 (ref 5–15)
Anion gap: 13 (ref 5–15)
BUN: 48 mg/dL — ABNORMAL HIGH (ref 8–23)
BUN: 48 mg/dL — ABNORMAL HIGH (ref 8–23)
CO2: 34 mmol/L — ABNORMAL HIGH (ref 22–32)
CO2: 36 mmol/L — ABNORMAL HIGH (ref 22–32)
Calcium: 8.2 mg/dL — ABNORMAL LOW (ref 8.9–10.3)
Calcium: 8.3 mg/dL — ABNORMAL LOW (ref 8.9–10.3)
Chloride: 93 mmol/L — ABNORMAL LOW (ref 98–111)
Chloride: 92 mmol/L — ABNORMAL LOW (ref 98–111)
Creatinine, Ser: 2.1 mg/dL — ABNORMAL HIGH (ref 0.61–1.24)
Creatinine, Ser: 2.08 mg/dL — ABNORMAL HIGH (ref 0.61–1.24)
GFR calc Af Amer: 37 mL/min — ABNORMAL LOW (ref >60)
GFR calc Af Amer: 37 mL/min — ABNORMAL LOW (ref >60)
GFR calc non Af Amer: 32 mL/min — ABNORMAL LOW (ref >60)
GFR calc non Af Amer: 32 mL/min — ABNORMAL LOW (ref >60)
Glucose, Bld: 181 mg/dL — ABNORMAL HIGH (ref 70–99)
Glucose, Bld: 193 mg/dL — ABNORMAL HIGH (ref 70–99)
Phosphorus: 3.1 mg/dL (ref 2.5–4.6)
Phosphorus: 2.9 mg/dL (ref 2.5–4.6)
Potassium: 3.9 mmol/L (ref 3.5–5.1)
Potassium: 3.9 mmol/L (ref 3.5–5.1)
Sodium: 141 mmol/L (ref 135–145)
Sodium: 141 mmol/L (ref 135–145)

## 2019-08-17 LAB — POCT I-STAT EG7
Acid-Base Excess: 9 mmol/L — ABNORMAL HIGH (ref 0.0–2.0)
Bicarbonate: 34.7 mmol/L — ABNORMAL HIGH (ref 20.0–28.0)
Calcium, Ion: 0.42 mmol/L — CL (ref 1.15–1.40)
HCT: 38 % — ABNORMAL LOW (ref 39.0–52.0)
Hemoglobin: 12.9 g/dL — ABNORMAL LOW (ref 13.0–17.0)
O2 Saturation: 78 %
Potassium: 3.8 mmol/L (ref 3.5–5.1)
Sodium: 141 mmol/L (ref 135–145)
TCO2: 36 mmol/L — ABNORMAL HIGH (ref 22–32)
pCO2, Ven: 53 mmHg (ref 44.0–60.0)
pH, Ven: 7.424 (ref 7.250–7.430)
pO2, Ven: 43 mmHg (ref 32.0–45.0)

## 2019-08-17 LAB — GLUCOSE, CAPILLARY
Glucose-Capillary: 173 mg/dL — ABNORMAL HIGH (ref 70–99)
Glucose-Capillary: 180 mg/dL — ABNORMAL HIGH (ref 70–99)
Glucose-Capillary: 181 mg/dL — ABNORMAL HIGH (ref 70–99)
Glucose-Capillary: 185 mg/dL — ABNORMAL HIGH (ref 70–99)
Glucose-Capillary: 250 mg/dL — ABNORMAL HIGH (ref 70–99)

## 2019-08-17 MED ORDER — FENTANYL 50 MCG/HR TD PT72
1.0000 | MEDICATED_PATCH | TRANSDERMAL | Status: DC
  Administered 2019-08-17: 1 via TRANSDERMAL
  Filled 2019-08-17: qty 1

## 2019-08-17 NOTE — Progress Notes (Signed)
Palpable crepitus in surrounding area of chest tube, bubbling serous fluid noted with inspiration/expiration at insertion site. Grade 3 air leak on [place]. Dressing reinforced, chest xray obtained and Elink notified. No desaturations noted. Per Elink, will not intervene at this time as oxygenation is stable.

## 2019-08-17 NOTE — Progress Notes (Signed)
[name]  ZHY:[id number] DOB: [birth year] DOA: 07/25/2019 PCP: Kathyrn Drown, MD    Brief Narrative:  68 year old with a history of GERD and BPH who presented to [facility], ED with S OB and was found to be hypoxic.  He was initially diagnosed with COVID-19 September 20.  1 to 2 days prior to his presentation he developed worsening shortness of breath with anorexia.  His wife's physician during a teleconference call noted that the patient himself is very tachypneic and ultimately EMS was sent to the house.  EMS found the patient to have saturations in the 40s.  In the ED on nonrebreather the patient sats only improved to the 80s.  The patient was severely confused and required emergent intubation.  Chest x-ray confirmed multifocal infiltrates.  CT chest was negative for PE  Significant Events: 10/3 admit to [facility] from [facility] ED 10/13 right pneumothorax -pigtail chest tube placed 10/14 Nephrology consulted 10/15 begin CRRT -endobronchial blocker placed 10/16 melena 10/17 A. fib with RVR -EGD 10/18 transfuse PRBC 10/20 Heparin drip resumed 10/21 abrupt drop in hemoglobin -transfuse 1 unit PRBC 10/23 tPA per chest tube   COVID-19 specific Treatment: Decadron 10/2 > 10/12 Remdesivir 10/2 > 10/6 Actemra 10/2 Convalescent plasma 10/3  Subjective: Noncommunicative.  Sedated on ventilator.  Some purulent discharge at insertion site of chest tube noted though the skin itself does not look infected.  Assessment & Plan:  Covid pneumonia - acute hypoxic respiratory failure Has completed a course of decadron and remdesivir - was dosed with actemra and convalescent plasma - pulmonary status remains very complicated and tenuous - ventilator management per PCCM  Pseudomonas and Acinetobacter in BAL specimen Changed abx to meropenem to cover - afebrile at this time -intent to complete at least 7 days of treatment and then reconsider  Acute renal failure CRRT dependent - Nephrology  following  GI bleed - gastric ulcer - acute blood loss anemia Status post EGD with clipping of vessel in large ulcer - full anticoagulation was resumed 10/20 following which he experienced an abrupt drop in his hemoglobin - he was transfused 1 unit 10/21 - anticoagulation stopped 10/21 - resumption of DVT prophylaxis 10/24 -no evidence of significant bleeding presently though his stools have become more dark -monitor in serial fashion  Recent Labs  Lab 08/17/19 0515 08/17/19 0556 08/17/19 0600  HGB 7.5* 8.8* 9.5*    Markedly elevated D-dimer signif increase noted 10/19 - unable to fully anticoag at present due to concern for GIB - no evidence of DVT on dopplers 10/8 - likely related to suspected empyema right lung  Pneumothorax right - empyema CT chest 10/21 noted findings most consistent with an empyema as well as a persisting pneumothorax - bedside bronchoscopy revealed significant material in the airway -TPA infused via chest tube 10/23 - chest tube management per PCCM  Acute transient atrial fibrillation with RVR No known prior history of atrial fibrillation -currently in sinus tachycardia-unable to fully anticoagulate due to possible recurrent GI bleeding -continue to follow on telemetry  MSSA bacteremia 2/2 blood cultures 10/9 - septic shock Completed a course of antibiotic for this indication -shock/hypotension resolved  HTN Blood pressure reasonably controlled at this time -follow without change today  BPH  DVT prophylaxis: Subcutaneous heparin Code Status: NO CODE - DNR Family Communication: per PCCM   Disposition Plan: ICU  Consultants:  PCCM Nephrology GI  Antimicrobials:  10/9 vancomycin > 10/10 10/9 cefepime > 10/10 10/10 ancef > 10/13 10/13 cefepime >  10/20 10/14 anidulofungin > 10/15 Cefazolin 10/20 > 10/23 Meropenem 10/23 >  Objective: Blood pressure 98/70, pulse (!) 111, temperature 98.8 F (37.1 C), temperature source Esophageal, resp. rate (!) 26,  height _0  (1.778 m), weight 83.7 kg, SpO2 99 %.  Intake/Output Summary (Last 24 hours) at 08/17/2019 0809 Last data filed at 08/17/2019 0700 Gross per 24 hour  Intake 3460.33 ml  Output 3978 ml  Net -517.67 ml   Filed Weights   08/15/19 0500 08/16/19 0500 08/17/19 0440  Weight: 88.7 kg 86.4 kg 83.7 kg    Examination: General: Sedated on ventilator  Lungs: No air movement right base -fine crackles throughout other fields  Cardiovascular: Tachycardic -regular -no murmur Abdomen: Soft, BS positive, no appreciable mass Extremities: No significant lower extremity edema  CBC: Recent Labs  Lab 08/15/19 0500  08/15/19 1510  08/16/19 0430  08/17/19 0515 08/17/19 0556 08/17/19 0600  WBC 7.5  --  10.2  --  9.0  --  8.2  --   --   NEUTROABS 6.9  --   --   --  8.3*  --  PENDING  --   --   HGB 7.3*   < > 8.6*   < > 8.3*   < > 7.5* 8.8* 9.5*  HCT 23.1*   < > 26.9*   < > 26.6*   < > 23.8* 26.0* 28.0*  MCV 96.3  --  95.7  --  96.0  --  95.6  --   --   PLT 122*  --  149*  --  129*  --  127*  --   --    < > = values in this interval not displayed.   Basic Metabolic Panel: Recent Labs  Lab 08/13/19 0500  08/14/19 0530  08/15/19 0500  08/16/19 0430  08/16/19 1655  08/17/19 0514 08/17/19 0556 08/17/19 0600  NA 135  134*   < > 136   < > 139   < > 139   < > 139   < > 141 140 139  K 4.5  4.5   < > 4.6   < > 3.9   < > 4.1   < > 4.1   < > 3.9 3.8 3.7  CL 97*  96*   < > 95*   < > 92*   < > 93*  --  96*   < > 93* 90* 93*  CO2 27  27   < > 27   < > 34*   < > 33*  --  32  --  34*  --   --   GLUCOSE 113*  112*   < > 237*   < > 147*   < > 132*  --  203*   < > 181* 228* 187*  BUN 44*  43*   < > 49*   < > 45*   < > 36*  --  39*   < > 48* 33* 39*  CREATININE 1.86*  1.79*   < > 2.39*   < > 2.19*   < > 2.23*  --  2.09*   < > 2.10* 1.60* 1.90*  CALCIUM 7.9*  7.9*   < > 7.6*   < > 7.7*   < > 7.8*  --  7.9*  --  8.2*  --   --   MG 2.4  --  2.3  --  2.1  --   --   --   --   --   --   --    --  PHOS 3.4   < > 5.6*   < > 3.3   < > 3.3  --  3.1  --  3.1  --   --    < > = values in this interval not displayed.   GFR: Estimated Creatinine Clearance: 39.5 mL/min (A) (by C-G formula based on SCr of 1.9 mg/dL (H)).  Liver Function Tests: Recent Labs  Lab 08/13/19 0500  08/14/19 0530  08/15/19 0500 08/15/19 1510 08/16/19 0430 08/16/19 1655 08/17/19 0514  AST 24  --  25  --  29  --  38  --   --   ALT 9  --  8  --  6  --  8  --   --   ALKPHOS 52  --  61  --  59  --  71  --   --   BILITOT 0.5  --  0.8  --  0.3  --  0.6  --   --   PROT 6.2*  --  6.2*  --  6.0*  --  6.2*  --   --   ALBUMIN 2.8*  2.8*   < > 2.4*  2.4*   < > 2.2*  2.2* 2.4* 2.1*  2.2* 1.9* 2.0*   < > = values in this interval not displayed.    HbA1C: Hgb A1c MFr Bld  Date/Time Value Ref Range Status  07/26/2019 05:00 AM 6.1 (H) 4.8 - 5.6 % Final    Comment:    (NOTE) Pre diabetes:          5.7%-6.4% Diabetes:              >6.4% Glycemic control for   <7.0% adults with diabetes     CBG: Recent Labs  Lab 08/16/19 1152 08/16/19 1534 08/16/19 2015 08/17/19 0002 08/17/19 0347  GLUCAP 139* 194* 230* 185* 180*    Recent Results (from the past 240 hour(s))  Culture, respiratory (non-expectorated)     Status: None   Collection Time: 08/13/19 11:30 AM   Specimen: Bronchoalveolar Lavage; Respiratory  Result Value Ref Range Status   Specimen Description   Final    BRONCHIAL ALVEOLAR LAVAGE Performed at [facility], [address] [place], [place] [address]    Special Requests   Final    Normal Performed at [place] [address], [place], [place] [address]    Gram Stain   Final    MODERATE WBC PRESENT,BOTH PMN AND MONONUCLEAR NO ORGANISMS SEEN Performed at [facility], [place] [address] [place], [place] [address]    Culture   Final    FEW PSEUDOMONAS AERUGINOSA FEW ACINETOBACTER CALCOACETICUS/BAUMANNII COMPLEX    Report Status 08/15/2019 FINAL   Final   Organism ID, Bacteria PSEUDOMONAS AERUGINOSA  Final   Organism ID, Bacteria ACINETOBACTER CALCOACETICUS/BAUMANNII COMPLEX  Final      Susceptibility   Acinetobacter calcoaceticus/baumannii complex - MIC*    CEFTAZIDIME 8 SENSITIVE Sensitive     CEFTRIAXONE 16 INTERMEDIATE Intermediate     CIPROFLOXACIN 0.5 SENSITIVE Sensitive     GENTAMICIN 4 SENSITIVE Sensitive     IMIPENEM <=0.25 SENSITIVE Sensitive     PIP/TAZO 16 SENSITIVE Sensitive     TRIMETH/SULFA <=20 SENSITIVE Sensitive     CEFEPIME 16 INTERMEDIATE Intermediate     AMPICILLIN/SULBACTAM <=2 SENSITIVE Sensitive     * FEW ACINETOBACTER CALCOACETICUS/BAUMANNII COMPLEX   Pseudomonas aeruginosa - MIC*    CEFTAZIDIME 4 SENSITIVE Sensitive  CIPROFLOXACIN <=0.25 SENSITIVE Sensitive     GENTAMICIN <=1 SENSITIVE Sensitive     IMIPENEM 1 SENSITIVE Sensitive     PIP/TAZO 8 SENSITIVE Sensitive     CEFEPIME <=1 SENSITIVE Sensitive     * FEW PSEUDOMONAS AERUGINOSA  Fungus Culture With Stain     Status: None   Collection Time: 08/13/19 11:30 AM  Result Value Ref Range Status   Fungus Stain Final report  Final    Comment: (NOTE) Performed At: [facility], [place] [id number] Rush Farmer MD [id number]    Fungus (Mycology) Culture PENDING  Incomplete   Fungal Source BRONCHIAL ALVEOLAR LAVAGE  Corrected    Comment: Performed at [facility], [place] [address], [place], [place] [address] CORRECTED ON 10/21 AT 1659: PREVIOUSLY REPORTED AS TRACHEAL ASPIRATE   Aspergillus Ag, BAL/Serum     Status: None   Collection Time: 08/13/19 11:30 AM  Result Value Ref Range Status   Aspergillus Ag, BAL/Serum 0.06 0.00 - 0.49 Index Final    Comment: (NOTE) Performed At: [facility] [address] [place], [place] [id number] Rush Farmer MD [id number] Performed At: [facility] [place], [place] [id number] Katina Degree MDPhD [id number]   Fungus Culture Result      Status: None   Collection Time: 08/13/19 11:30 AM  Result Value Ref Range Status   Result 1 Comment  Final    Comment: (NOTE) KOH/Calcofluor preparation:  no fungus observed. Performed At: [facility] [place], [place] [id number] Rush Farmer MD NI:[id number]   Expectorated sputum assessment w rflx to resp cult     Status: None   Collection Time: 08/16/19  9:25 AM   Specimen: Expectorated Sputum  Result Value Ref Range Status   Specimen Description EXPECTORATED SPUTUM  Final   Special Requests Normal  Final   Sputum evaluation   Final    THIS SPECIMEN IS ACCEPTABLE FOR SPUTUM CULTURE Performed at [facility], [place] [address] [place], [place] [address]    Report Status 08/16/2019 FINAL  Final  Culture, respiratory     Status: None (Preliminary result)   Collection Time: 08/16/19  9:25 AM  Result Value Ref Range Status   Specimen Description   Final    EXPECTORATED SPUTUM Performed at [place] [address], [place], [place] [address]    Special Requests   Final    Normal Reflexed from [id number] Performed at [facility], [place] [address], [place], [place] [address]    Gram Stain   Final    ABUNDANT WBC PRESENT, PREDOMINANTLY PMN NO ORGANISMS SEEN Performed at [facility], [place] [address], [place], [place] [address]    Culture PENDING  Incomplete   Report Status PENDING  Incomplete     Scheduled Meds: . amLODipine  10 mg Per Tube Daily  . chlorhexidine  15 mL Mouth/Throat BID  . Chlorhexidine Gluconate Cloth  6 each Topical Daily  . doxazosin  2 mg Per Tube Daily  . feeding supplement (PRO-STAT SUGAR FREE 64)  60 mL Per Tube TID  . heparin injection (subcutaneous)  7,500 Units Subcutaneous Q8H  . hydrALAZINE  25 mg Per Tube Q6H  . insulin aspart  0-9 Units Subcutaneous Q4H  . mouth rinse  15 mL Mouth Rinse 10 times per day  . pantoprazole (PROTONIX) IV  40 mg Intravenous Q12H  .  polyethylene glycol  17 g Per Tube QHS  .  sennosides  5 mL Per Tube QHS  . sodium chloride HYPERTONIC  4 mL Nebulization Daily  . sucralfate  1 g Oral Q6H   Continuous Infusions: .  prismasol BGK 4/2.5 200 mL/hr at 08/15/19 2009  . sodium chloride Stopped (08/16/19 0431)  . sodium chloride 5 mL/hr at 08/17/19 0700  . calcium gluconate infusion for CRRT 20 g (08/17/19 0250)  . dextrose Stopped (08/16/19 1623)  . feeding supplement (PIVOT 1.5 CAL) 1,000 mL (08/17/19 0422)  . meropenem (MERREM) IV Stopped (08/17/19 7943)  . norepinephrine (LEVOPHED) Adult infusion Stopped (08/16/19 2116)  . prismasol B22GK 4/0 1,800 mL/hr at 08/17/19 0655  . sodium citrate 2 %/dextrose 2.5% solution 3000 mL 410 mL/hr at 08/17/19 0350     LOS: 23 days   Cherene Altes, MD [facility] Office  [phone] Pager - Text Page per Amion  If 7PM-7AM, please contact night-coverage per Amion 08/17/2019, 8:09 AM

## 2019-08-17 NOTE — Progress Notes (Signed)
Called pt's wife to update of pt condition and plan of care. Pt's wife appreciative of update

## 2019-08-17 NOTE — Progress Notes (Signed)
  Advanced et 2 cm per MD request due to cuff leak and chest tube leak. VS within normal limits. Pt in no distress, no increased WOB> RT will continue to monitor

## 2019-08-17 NOTE — Progress Notes (Signed)
Endobronchial blocker removed by Dr. Lake Bells.  [place] and HME changed.

## 2019-08-17 NOTE — Progress Notes (Signed)
[facility] Progress Note    68 year old gentleman who was admitted with Covid pneumonia July 13, 2019 completed course of remdesivir convalescent plasma Decadron treated with vancomycin and cefepime from 08/01/2019 until 08/02/2019. On 08/02/2019 patient developed an ileus. His creatinine on 07/25/2019 was 1.5 which appears to be his baseline. On 08/02/2019 started to increase. On 08/02/2019 was found to have MSSA bacteremia and was started on Ancef. His blood pressure has been marginal with drops in blood pressure to 70 mmHg. His renal ultrasound showed no hydronephrosis on 08/07/2019. Pt had administration of intravenous contrast on 07/25/2019.  Levo gtt off at this time.  Remains off of IV heparin due to poss GIB.  Assessment/ Plan:    Acute kidney injury: on CKD w/ baseline creat of 1.5. Admitted with Covid pneumonia complicated course with recent history of MSSA bacteremia. Developed shock hypotension and required pressors. No evidence of hydronephrosis on renal ultrasound. Suspected ATN.There was administration of contrast but this was early on the course of his illness on 07/25/2019.Formetabolic acidosis was started on CRRT 08/07/2019. This is D#11 with no signs of recovery at this time.  Seen on CVVHDF 350/400/1500 all 4K baths Net even for now ? Getting citrate local a/c protocol ? Keeping even w/ CRRT -> no changes for now. Doesn't appear to be volume overloaded.  Anemia / sp GIB - as per primary team transfuse as necessary  COVID-19 pneumonia- Treated with remdesivir, dexamethasone and convalescent plasma  Afib / RVR - new, IV amio  GIB - acute, sp EGD 10/17 w/ large gastric ulcer/ w visible vessel, Rx'd by clipping  VDRF/ R PTX w/ chest tube  Shock - Off pressors at this time   MSSA bacteremia/ ? Fungal airway infectino - on IV Ancef and antifungals  Subjective:   Off pressors and tolerating CRRT.   Objective:   BP (!) 81/55    Pulse (!) 117   Temp 97.9 F (36.6 C) (Esophageal)   Resp (!) 41   Ht 5\' 10"  (1.778 m)   Wt 83.7 kg   SpO2 91%   BMI 26.48 kg/m   Intake/Output Summary (Last 24 hours) at 08/17/2019 1312 Last data filed at 08/17/2019 1306 Gross per 24 hour  Intake 3648.83 ml  Output 4357 ml  Net -708.17 ml   Weight change: -2.7 kg  Physical Exam: Seen in ICU, on vent, sedated L IJ temp HD cath No jvd Chest cta bilat Cor reg no RG Abd soft obese Ext  no LE edema Neuro sedated   Imaging: Dg Chest Port 1 View  Result Date: 08/16/2019 CLINICAL DATA:  68 year old male with positive COVID-19. EXAM: PORTABLE CHEST 1 VIEW COMPARISON:  Earlier radiograph dated 08/16/2019 FINDINGS: Support lines and tubes as seen previously. Diffuse bilateral airspace opacities similar to prior radiograph. Apparent area of lucency at the right costophrenic angle may be artifactual and related to skin fold or represent a small residual right pneumothorax. Stable cardiac silhouette. No acute osseous pathology. IMPRESSION: 1. Artifact versus small residual right pneumothorax at the right costophrenic angle. 2. Diffuse bilateral airspace opacities similar to prior radiograph. 3. Support lines and tubes as seen previously. Electronically Signed   By: Anner Crete M.D.   On: 08/16/2019 21:01   Dg Chest Port 1 View  Result Date: 08/16/2019 CLINICAL DATA:  Hypoxia EXAM: PORTABLE CHEST 1 VIEW COMPARISON:  August 15, 2019 FINDINGS: Endotracheal tube tip is 4.2 cm above the carina. Nasogastric tube tip and side port are below the diaphragm.  Right subclavian catheter tip is in the right atrium. Chest tube noted on the right inferiorly, unchanged in position. Left internal jugular catheter tip is at the junction of the left innominate vein and superior vena cava. There is a catheter with its tip overlying the right lower lobe bronchus medially, stable. No pneumothorax is evident currently. There is persistent  airspace consolidation in the right mid and lower lung zones. There is patchy opacity in the left mid and lower lung zones, stable. No new opacity evident. Heart is upper normal in size with pulmonary vascular normal. No adenopathy. No bone lesions. IMPRESSION: Stable tube and catheter positions. No pneumothorax evident currently. Airspace opacity bilaterally with areas of consolidation on the right, stable. No new opacity evident. Stable cardiac silhouette. Electronically Signed   By: Lowella Grip III M.D.   On: 08/16/2019 09:06   Dg Chest Port 1 View  Result Date: 08/15/2019 CLINICAL DATA:  Orogastric tube placement. EXAM: PORTABLE CHEST 1 VIEW COMPARISON:  Same day. FINDINGS: Stable cardiomediastinal silhouette. Distal tip of enteric tube is seen in proximal stomach. Endotracheal tube is unchanged. Left internal jugular catheter is unchanged. Right subclavian catheter is unchanged. Stable position of right-sided basilar chest tube is noted without pneumothorax. Stable bilateral lung opacities are noted. Stable catheter seen entering right mainstem bronchus compared to prior exam. Bony thorax is unremarkable. IMPRESSION: Feeding tube has been removed. There has been interval placement of what appears to be nasogastric tube with tip in expected position of proximal stomach. Stable bilateral lung opacities and other support apparatus are noted. Electronically Signed   By: Marijo Conception M.D.   On: 08/15/2019 14:55   Dg Abd Portable 1v  Result Date: 08/16/2019 CLINICAL DATA:  OG tube placement EXAM: PORTABLE ABDOMEN - 1 VIEW COMPARISON:  08/13/2019 FINDINGS: Nasogastric tube with the tip projecting over the stomach. There is no bowel dilatation to suggest obstruction. There is no evidence of pneumoperitoneum, portal venous gas or pneumatosis. There are no pathologic calcifications along the expected course of the ureters. The osseous structures are unremarkable. IMPRESSION: Nasogastric tube with the  tip projecting over the stomach. Electronically Signed   By: Kathreen Devoid   On: 08/16/2019 12:09    Labs: BMET Recent Labs  Lab 08/14/19 0530  08/14/19 1715  08/15/19 0500  08/15/19 1510  08/16/19 0430  08/16/19 1655  08/17/19 0352 08/17/19 0514 08/17/19 0556 08/17/19 0600 08/17/19 0814 08/17/19 0821 08/17/19 0835 08/17/19 1029 08/17/19 1038  NA 136   < > 138   < > 139   < > 139   < > 139   < > 139   < > 139 141 140 139 141 139 138 141 139  K 4.6   < > 3.8   < > 3.9   < > 4.0   < > 4.1   < > 4.1   < > 3.7 3.9 3.8 3.7 3.8 3.9 3.7 3.6 3.7  CL 95*  --  96*  --  92*  --  94*   < > 93*  --  96*   < > 92* 93* 90* 93*  --  91*  --  88* 92*  CO2 27  --  31  --  34*  --  33*  --  33*  --  32  --   --  34*  --   --   --   --   --   --   --   GLUCOSE  237*  --  196*  --  147*  --  144*   < > 132*  --  203*   < > 175* 181* 228* 187*  --  199*  --  245* 198*  BUN 49*  --  53*  --  45*  --  38*   < > 36*  --  39*   < > 38* 48* 33* 39*  --  41*  --  36* 42*  CREATININE 2.39*  --  2.28*  --  2.19*  --  1.96*   < > 2.23*  --  2.09*   < > 2.00* 2.10* 1.60* 1.90*  --  2.10*  --  1.70* 2.10*  CALCIUM 7.6*  --  7.5*  --  7.7*  --  7.8*  --  7.8*  --  7.9*  --   --  8.2*  --   --   --   --   --   --   --   PHOS 5.6*  --  3.3  --  3.3  --  3.1  --  3.3  --  3.1  --   --  3.1  --   --   --   --   --   --   --    < > = values in this interval not displayed.   CBC Recent Labs  Lab 08/14/19 0530  08/15/19 0500  08/15/19 1510  08/16/19 0430  08/17/19 0515  08/17/19 0821 08/17/19 0835 08/17/19 1029 08/17/19 1038  WBC 10.8*  --  7.5  --  10.2  --  9.0  --  8.2  --   --   --   --   --   NEUTROABS 10.1*  --  6.9  --   --   --  8.3*  --  7.7  --   --   --   --   --   HGB 7.9*   < > 7.3*   < > 8.6*   < > 8.3*   < > 7.5*   < > 11.2* 7.1* 9.5* 8.8*  HCT 25.4*   < > 23.1*   < > 26.9*   < > 26.6*   < > 23.8*   < > 33.0* 21.0* 28.0* 26.0*  MCV 98.4  --  96.3  --  95.7  --  96.0  --  95.6  --   --   --   --    --   PLT 130*  --  122*  --  149*  --  129*  --  127*  --   --   --   --   --    < > = values in this interval not displayed.    Medications:    . chlorhexidine  15 mL Mouth/Throat BID  . Chlorhexidine Gluconate Cloth  6 each Topical Daily  . doxazosin  2 mg Per Tube Daily  . feeding supplement (PRO-STAT SUGAR FREE 64)  60 mL Per Tube TID  . fentaNYL  1 patch Transdermal Q72H  . heparin injection (subcutaneous)  7,500 Units Subcutaneous Q8H  . insulin aspart  0-9 Units Subcutaneous Q4H  . mouth rinse  15 mL Mouth Rinse 10 times per day  . pantoprazole (PROTONIX) IV  40 mg Intravenous Q12H  . polyethylene glycol  17 g Per Tube QHS  . sennosides  5 mL Per Tube QHS  . sodium chloride HYPERTONIC  4 mL  Nebulization Daily  . sucralfate  1 g Oral Q6H      Otelia Santee, MD 08/17/2019, 1:12 PM

## 2019-08-17 NOTE — Progress Notes (Signed)
NAME:  [name], MRN:  [id number], DOB:  [birth year], LOS: 23 ADMISSION DATE:  07/25/2019, CONSULTATION DATE:  10/3 REFERRING MD:  Nadara Mustard, CHIEF COMPLAINT:  Dyspnea   Brief History   68 yo male dx with COVID 07/22/19 presented to [facility] ER 07/25/19 with progressive dyspnea and hypoxia requiring intubation from COVID 19 pneumonia.  Past Medical History  GERD  Significant Hospital Events   10/03 Admit to [facility] from [facility] ER, start decadron and remdesivir, given tociluzimab; prone positioning 10/04 convalescent plasma 10/05 stop prone positioning 10/06 convalescent plasma; prone positioning again 10/09 start ABx 10/10 MSSA bacteremia, CVL d/ced; ID consulted; increase OG tube outpt 10/11 vent weaning trial started 10/13 fever, pneumothorax, pig tail chest tube placed 10/14 worsening hypotension and renal fx >> consulted nephrology; worsening PTX >> replaced chest tube; GNR in sputum >> ABx changed 10/15 start CRRT; endobronchial blocker placed for persistent air leak 10/16 endobronchial blocker repositioned, changed chest tube to water seal; melana with ABLA >> GI consulted; transfuse PRBC 10/17 persistent air leak, increased WOB; start nimbex gtt >> air leak decreased; EGD; A fib with RVR >> start amiodarone 10/18 transfuse PRBC; GI s/o 10/20 resume heparin gtt 10/21 stopped nimbex, chest tube to suction, stopped heparin drip because Hgb dropped again 10/22 TPA in chest tube, repositioned endobronchial blocker 10/23 deflated endobronchial balloon 10/24 small air leak  10/25 removed bronchial blocker  Consults:  ID Nephrology Gastroenterology PCCM  Procedures:  10/2 ETT>  10/2 R IJ CVL > 10/10 10/13 Rt pig tail chest tube >> 10/14 10/14 R 35F chest tube >  10/14 R subclavian CVL >  10/14 L IJ HD cath >  10/14 Lt radial a line >   Significant Diagnostic Tests:  10/2 CT angiogram chest > extensive bilateral airspace disease predominantly posterior 10/8 doppler legs b/l >  no DVT 10/15 renal u/s > normal 10/17 EGD > non bleeding gastric ulcer  10/19 Echo >> EF 60 to 65%, hyperdynamic LV with small LVOT gradient 10/22 CT chest > large collection of air in pleural space with fluid, pneumonia bilaterally R>L endobronchial blocker in place, chest tube anterior  Micro Data:  9/20 SARS COV 2 > POSITIVE 10/2 blood > negative 10/9 blood > MSSA 2/4 10/9 resp > MSSA, Pseudomonas 10/13 blood >> negative 10/21 bronch wash bacterial > pseduomonas, acinetobacter baumannii 10/21 bronch wash fungal >  10/21 bronch wash aspergillus antigen> negative 10/24 bronchoscopy wash>   Antimicrobials:  10/3 remdesivir > 10/6 10/3 actemra 10/3 decadron > 10/12 10/4 convalescent plasma 10/6 convalescent plasma  10/9 vancomycin > 10/10 10/9 cefepime > 10/10 10/10 ancef > 10/13 10/13 cefepime > 10/20 10/14 anidulofungin > 10/15  10/20 ancef > 10/23 10/23 meropenem >   10/21 anidulofungin> 10/22 10/22 voriconazole > 10/23  Interim history/subjective:   Still has an air leak, small, ABG and CXR OK Off vasopressors Has periods of ventilator dyssynchrony requiring sedation  Objective   Blood pressure 98/70, pulse (!) 111, temperature 98.8 F (37.1 C), temperature source Esophageal, resp. rate (!) 26, height 5\' 10"  (1.778 m), weight 83.7 kg, SpO2 99 %. CVP:  [0 mmHg-14 mmHg] 0 mmHg  Vent Mode: PRVC FiO2 (%):  [60 %] 60 % Set Rate:  [24 bmp] 24 bmp Vt Set:  [480 mL] 480 mL PEEP:  [8 cmH20] 8 cmH20 Plateau Pressure:  [21 cmH20-23 cmH20] 23 cmH20   Intake/Output Summary (Last 24 hours) at 08/17/2019 0806 Last data filed at 08/17/2019 0700 Gross per 24 hour  Intake 3460.33 ml  Output 3978 ml  Net -517.67 ml   Filed Weights   08/15/19 0500 08/16/19 0500 08/17/19 0440  Weight: 88.7 kg 86.4 kg 83.7 kg    Examination:  General:  In bed on vent HENT: NCAT ETT in place PULM: CTA B, vent supported breathing CV: RRR, no mgr GI: BS+, soft, nontender MSK: normal  bulk and tone Neuro: drowsy on ventilator, minimal response to external stimuli   No air leak on chest tube today  10/23 CXR images personally reviewed: bilateral air space disease, chest tube in place, ETT in place, endobronchial blocker in place   Resolved Hospital Problem list   Atrial fibrillation Septic shock  Assessment & Plan:  ARDS due to COVID-19 pneumonia: improving oxygenation, oxygenation a little worse 10/24 due to HCAP Continue full vent support but if dyssynchrony recurs attempt pressure support ventilation Wean PEEP/FiO2 for SaO2 > 85% VAP prevention  R Bronchopleural fistula, small air leak 10/25, ABG and CXR OK so impact of air leak is minimal Likely necrotic RLL, unclear if fistula will heal Empyema R Remove endobronchial blocker today Continue chest tube to suction Eventually will need thoracic surgery evaluation of RLL (Lobectomy?)  Acute kidney injury: Continue CVVHD Monitor BMET and UOP Replace electrolytes as needed  Circulatory shock: resolved Monitor off vasopressors  Need for sedation/mechanical ventilation RASS target 0 to -1 Neuromuscular weakness Intermittent ventilator dyssynchrony: narcotic withdrawal? Add low dose fentanyl patch to see if this will minimize the boluses of sedation  Gastric ulcer: PPI  Anemia, acute hemorrhagic: Hgb down again on 10/23, spurious? Sub cutaneous heparin for DVT prophylaxis  MSSA bacteremia Acinetobacter and Pseudomonal pneumonia> RLL necrotic? Continue meropenem for 7 days then assess at that point if further treatment needed Remove bronchial blocker to improve mucociliary clearance   Best practice:  Diet: tube feeding Pain/Anxiety/Delirium protocol (if indicated): as above VAP protocol (if indicated): yes DVT prophylaxis: prophylactic dosing heparin GI prophylaxis: pantoprazole Glucose control: SSI Mobility: bed rest Code Status: DNR Family Communication: I updated [name] by phone today  Disposition: remain in ICU  Labs   CBC: Recent Labs  Lab 08/13/19 0500  08/14/19 0530  08/15/19 0500  08/15/19 1510  08/16/19 0430  08/17/19 0347 08/17/19 0352 08/17/19 0515 08/17/19 0556 08/17/19 0600  WBC 9.2   < > 10.8*  --  7.5  --  10.2  --  9.0  --   --   --  8.2  --   --   NEUTROABS 8.0*  --  10.1*  --  6.9  --   --   --  8.3*  --   --   --  PENDING  --   --   HGB 6.6*   < > 7.9*   < > 7.3*   < > 8.6*   < > 8.3*   < > 8.8* 8.8* 7.5* 8.8* 9.5*  HCT 21.7*   < > 25.4*   < > 23.1*   < > 26.9*   < > 26.6*   < > 26.0* 26.0* 23.8* 26.0* 28.0*  MCV 101.4*   < > 98.4  --  96.3  --  95.7  --  96.0  --   --   --  95.6  --   --   PLT 118*   < > 130*  --  122*  --  149*  --  129*  --   --   --  127*  --   --    < > =  values in this interval not displayed.    Basic Metabolic Panel: Recent Labs  Lab 08/11/19 0500  08/12/19 0500  08/13/19 0500  08/14/19 0530  08/15/19 0500  08/15/19 1510  08/16/19 0430  08/16/19 1655  08/17/19 0347 08/17/19 0352 08/17/19 0514 08/17/19 0556 08/17/19 0600  NA 139  139   < > 140  140   < > 135  134*   < > 136   < > 139   < > 139   < > 139   < > 139   < > 140 139 141 140 139  K 4.5  4.5   < > 4.0  4.0   < > 4.5  4.5   < > 4.6   < > 3.9   < > 4.0   < > 4.1   < > 4.1   < > 3.8 3.7 3.9 3.8 3.7  CL 93*  93*   < > 95*  94*   < > 97*  96*   < > 95*   < > 92*  --  94*   < > 93*  --  96*   < > 90* 92* 93* 90* 93*  CO2 33*  34*   < > 33*  32   < > 27  27   < > 27   < > 34*  --  33*  --  33*  --  32  --   --   --  34*  --   --   GLUCOSE 130*  129*   < > 215*  216*   < > 113*  112*   < > 237*   < > 147*  --  144*   < > 132*  --  203*   < > 225* 175* 181* 228* 187*  BUN 66*  66*   < > 45*  46*   < > 44*  43*   < > 49*   < > 45*  --  38*   < > 36*  --  39*   < > 36* 38* 48* 33* 39*  CREATININE 2.49*  2.50*   < > 1.89*  1.91*   < > 1.86*  1.79*   < > 2.39*   < > 2.19*  --  1.96*   < > 2.23*  --  2.09*   < > 1.60* 2.00* 2.10* 1.60* 1.90*   CALCIUM 9.3  9.3   < > 7.3*  7.3*   < > 7.9*  7.9*   < > 7.6*   < > 7.7*  --  7.8*  --  7.8*  --  7.9*  --   --   --  8.2*  --   --   MG 2.3  --  2.1  --  2.4  --  2.3  --  2.1  --   --   --   --   --   --   --   --   --   --   --   --   PHOS 5.0*  4.8*   < > 2.6   < > 3.4   < > 5.6*   < > 3.3  --  3.1  --  3.3  --  3.1  --   --   --  3.1  --   --    < > = values in this interval not displayed.   GFR: Estimated Creatinine Clearance:  39.5 mL/min (A) (by C-G formula based on SCr of 1.9 mg/dL (H)). Recent Labs  Lab 08/15/19 0500 08/15/19 1510 08/16/19 0430 08/17/19 0515  WBC 7.5 10.2 9.0 8.2    Liver Function Tests: Recent Labs  Lab 08/12/19 0500  08/13/19 0500  08/14/19 0530  08/15/19 0500 08/15/19 1510 08/16/19 0430 08/16/19 1655 08/17/19 0514  AST 32  --  24  --  25  --  29  --  38  --   --   ALT 9  --  9  --  8  --  6  --  8  --   --   ALKPHOS 63  --  52  --  61  --  59  --  71  --   --   BILITOT 0.5  --  0.5  --  0.8  --  0.3  --  0.6  --   --   PROT 5.9*  --  6.2*  --  6.2*  --  6.0*  --  6.2*  --   --   ALBUMIN 2.1*  2.1*   < > 2.8*  2.8*   < > 2.4*  2.4*   < > 2.2*  2.2* 2.4* 2.1*  2.2* 1.9* 2.0*   < > = values in this interval not displayed.   No results for input(s): LIPASE, AMYLASE in the last 168 hours. No results for input(s): AMMONIA in the last 168 hours.  ABG    Component Value Date/Time   PHART 7.488 (H) 08/16/2019 1807   PCO2ART 47.6 08/16/2019 1807   PO2ART 144.0 (H) 08/16/2019 1807   HCO3 36.1 (H) 08/16/2019 1807   TCO2 33 (H) 08/17/2019 0600   ACIDBASEDEF 2.0 08/14/2019 0515   O2SAT 99.0 08/16/2019 1807     Coagulation Profile: No results for input(s): INR, PROTIME in the last 168 hours.  Cardiac Enzymes: No results for input(s): CKTOTAL, CKMB, CKMBINDEX, TROPONINI in the last 168 hours.  HbA1C: Hgb A1c MFr Bld  Date/Time Value Ref Range Status  07/26/2019 05:00 AM 6.1 (H) 4.8 - 5.6 % Final    Comment:    (NOTE) Pre diabetes:           5.7%-6.4% Diabetes:              >6.4% Glycemic control for   <7.0% adults with diabetes     CBG: Recent Labs  Lab 08/16/19 1152 08/16/19 1534 08/16/19 2015 08/17/19 0002 08/17/19 0347  GLUCAP 139* 194* 230* 185* 180*       Critical care time: 40 minutes      Roselie Awkward, MD [place] [facility] Pager: [phone] Cell: [phone] If no response, call [phone]

## 2019-08-18 ENCOUNTER — Inpatient Hospital Stay (HOSPITAL_COMMUNITY): Payer: Medicare HMO

## 2019-08-18 DIAGNOSIS — U071 COVID-19: Secondary | ICD-10-CM | POA: Diagnosis not present

## 2019-08-18 DIAGNOSIS — E872 Acidosis: Secondary | ICD-10-CM | POA: Diagnosis not present

## 2019-08-18 DIAGNOSIS — I4891 Unspecified atrial fibrillation: Secondary | ICD-10-CM | POA: Diagnosis not present

## 2019-08-18 DIAGNOSIS — I959 Hypotension, unspecified: Secondary | ICD-10-CM | POA: Diagnosis not present

## 2019-08-18 DIAGNOSIS — J1289 Other viral pneumonia: Secondary | ICD-10-CM | POA: Diagnosis not present

## 2019-08-18 DIAGNOSIS — N179 Acute kidney failure, unspecified: Secondary | ICD-10-CM | POA: Diagnosis not present

## 2019-08-18 DIAGNOSIS — Z4682 Encounter for fitting and adjustment of non-vascular catheter: Secondary | ICD-10-CM | POA: Diagnosis not present

## 2019-08-18 DIAGNOSIS — J9600 Acute respiratory failure, unspecified whether with hypoxia or hypercapnia: Secondary | ICD-10-CM | POA: Diagnosis not present

## 2019-08-18 DIAGNOSIS — A4901 Methicillin susceptible Staphylococcus aureus infection, unspecified site: Secondary | ICD-10-CM | POA: Diagnosis not present

## 2019-08-18 DIAGNOSIS — D649 Anemia, unspecified: Secondary | ICD-10-CM | POA: Diagnosis not present

## 2019-08-18 DIAGNOSIS — J9601 Acute respiratory failure with hypoxia: Secondary | ICD-10-CM | POA: Diagnosis not present

## 2019-08-18 DIAGNOSIS — R579 Shock, unspecified: Secondary | ICD-10-CM | POA: Diagnosis not present

## 2019-08-18 LAB — POCT I-STAT, CHEM 8
BUN: 40 mg/dL — ABNORMAL HIGH (ref 8–23)
BUN: 43 mg/dL — ABNORMAL HIGH (ref 8–23)
BUN: 37 mg/dL — ABNORMAL HIGH (ref 8–23)
BUN: 45 mg/dL — ABNORMAL HIGH (ref 8–23)
BUN: 36 mg/dL — ABNORMAL HIGH (ref 8–23)
BUN: 43 mg/dL — ABNORMAL HIGH (ref 8–23)
BUN: 35 mg/dL — ABNORMAL HIGH (ref 8–23)
BUN: 44 mg/dL — ABNORMAL HIGH (ref 8–23)
Calcium, Ion: 0.41 mmol/L — CL (ref 1.15–1.40)
Calcium, Ion: 1.03 mmol/L — ABNORMAL LOW (ref 1.15–1.40)
Calcium, Ion: 0.4 mmol/L — CL (ref 1.15–1.40)
Calcium, Ion: 1.01 mmol/L — ABNORMAL LOW (ref 1.15–1.40)
Calcium, Ion: 0.39 mmol/L — CL (ref 1.15–1.40)
Calcium, Ion: 0.98 mmol/L — ABNORMAL LOW (ref 1.15–1.40)
Calcium, Ion: 0.41 mmol/L — CL (ref 1.15–1.40)
Calcium, Ion: 1.05 mmol/L — ABNORMAL LOW (ref 1.15–1.40)
Chloride: 87 mmol/L — ABNORMAL LOW (ref 98–111)
Chloride: 91 mmol/L — ABNORMAL LOW (ref 98–111)
Chloride: 88 mmol/L — ABNORMAL LOW (ref 98–111)
Chloride: 88 mmol/L — ABNORMAL LOW (ref 98–111)
Chloride: 88 mmol/L — ABNORMAL LOW (ref 98–111)
Chloride: 89 mmol/L — ABNORMAL LOW (ref 98–111)
Chloride: 88 mmol/L — ABNORMAL LOW (ref 98–111)
Chloride: 88 mmol/L — ABNORMAL LOW (ref 98–111)
Creatinine, Ser: 1.6 mg/dL — ABNORMAL HIGH (ref 0.61–1.24)
Creatinine, Ser: 1.9 mg/dL — ABNORMAL HIGH (ref 0.61–1.24)
Creatinine, Ser: 1.4 mg/dL — ABNORMAL HIGH (ref 0.61–1.24)
Creatinine, Ser: 2 mg/dL — ABNORMAL HIGH (ref 0.61–1.24)
Creatinine, Ser: 1.5 mg/dL — ABNORMAL HIGH (ref 0.61–1.24)
Creatinine, Ser: 1.9 mg/dL — ABNORMAL HIGH (ref 0.61–1.24)
Creatinine, Ser: 1.4 mg/dL — ABNORMAL HIGH (ref 0.61–1.24)
Creatinine, Ser: 2 mg/dL — ABNORMAL HIGH (ref 0.61–1.24)
Glucose, Bld: 212 mg/dL — ABNORMAL HIGH (ref 70–99)
Glucose, Bld: 253 mg/dL — ABNORMAL HIGH (ref 70–99)
Glucose, Bld: 208 mg/dL — ABNORMAL HIGH (ref 70–99)
Glucose, Bld: 244 mg/dL — ABNORMAL HIGH (ref 70–99)
Glucose, Bld: 182 mg/dL — ABNORMAL HIGH (ref 70–99)
Glucose, Bld: 227 mg/dL — ABNORMAL HIGH (ref 70–99)
Glucose, Bld: 179 mg/dL — ABNORMAL HIGH (ref 70–99)
Glucose, Bld: 222 mg/dL — ABNORMAL HIGH (ref 70–99)
HCT: 24 % — ABNORMAL LOW (ref 39.0–52.0)
HCT: 26 % — ABNORMAL LOW (ref 39.0–52.0)
HCT: 21 % — ABNORMAL LOW (ref 39.0–52.0)
HCT: 24 % — ABNORMAL LOW (ref 39.0–52.0)
HCT: 20 % — ABNORMAL LOW (ref 39.0–52.0)
HCT: 21 % — ABNORMAL LOW (ref 39.0–52.0)
HCT: 20 % — ABNORMAL LOW (ref 39.0–52.0)
HCT: 21 % — ABNORMAL LOW (ref 39.0–52.0)
Hemoglobin: 8.2 g/dL — ABNORMAL LOW (ref 13.0–17.0)
Hemoglobin: 8.8 g/dL — ABNORMAL LOW (ref 13.0–17.0)
Hemoglobin: 7.1 g/dL — ABNORMAL LOW (ref 13.0–17.0)
Hemoglobin: 8.2 g/dL — ABNORMAL LOW (ref 13.0–17.0)
Hemoglobin: 6.8 g/dL — CL (ref 13.0–17.0)
Hemoglobin: 7.1 g/dL — ABNORMAL LOW (ref 13.0–17.0)
Hemoglobin: 6.8 g/dL — CL (ref 13.0–17.0)
Hemoglobin: 7.1 g/dL — ABNORMAL LOW (ref 13.0–17.0)
Potassium: 3.5 mmol/L (ref 3.5–5.1)
Potassium: 3.6 mmol/L (ref 3.5–5.1)
Potassium: 3.5 mmol/L (ref 3.5–5.1)
Potassium: 3.6 mmol/L (ref 3.5–5.1)
Potassium: 3.7 mmol/L (ref 3.5–5.1)
Potassium: 3.9 mmol/L (ref 3.5–5.1)
Potassium: 3.7 mmol/L (ref 3.5–5.1)
Potassium: 3.8 mmol/L (ref 3.5–5.1)
Sodium: 140 mmol/L (ref 135–145)
Sodium: 142 mmol/L (ref 135–145)
Sodium: 140 mmol/L (ref 135–145)
Sodium: 141 mmol/L (ref 135–145)
Sodium: 140 mmol/L (ref 135–145)
Sodium: 141 mmol/L (ref 135–145)
Sodium: 141 mmol/L (ref 135–145)
Sodium: 141 mmol/L (ref 135–145)
TCO2: 32 mmol/L (ref 22–32)
TCO2: 39 mmol/L — ABNORMAL HIGH (ref 22–32)
TCO2: 31 mmol/L (ref 22–32)
TCO2: 36 mmol/L — ABNORMAL HIGH (ref 22–32)
TCO2: 34 mmol/L — ABNORMAL HIGH (ref 22–32)
TCO2: 36 mmol/L — ABNORMAL HIGH (ref 22–32)
TCO2: 32 mmol/L (ref 22–32)
TCO2: 38 mmol/L — ABNORMAL HIGH (ref 22–32)

## 2019-08-18 LAB — CULTURE, RESPIRATORY W GRAM STAIN: Special Requests: NORMAL

## 2019-08-18 LAB — CBC WITH DIFFERENTIAL/PLATELET
Abs Immature Granulocytes: 0.03 10*3/uL (ref 0.00–0.07)
Basophils Absolute: 0 10*3/uL (ref 0.0–0.1)
Basophils Relative: 0 %
Eosinophils Absolute: 0.2 10*3/uL (ref 0.0–0.5)
Eosinophils Relative: 2 %
HCT: 21.4 % — ABNORMAL LOW (ref 39.0–52.0)
Hemoglobin: 6.8 g/dL — CL (ref 13.0–17.0)
Immature Granulocytes: 0 %
Lymphocytes Relative: 3 %
Lymphs Abs: 0.2 10*3/uL — ABNORMAL LOW (ref 0.7–4.0)
MCH: 30.5 pg (ref 26.0–34.0)
MCHC: 31.8 g/dL (ref 30.0–36.0)
MCV: 96 fL (ref 80.0–100.0)
Monocytes Absolute: 0.2 10*3/uL (ref 0.1–1.0)
Monocytes Relative: 3 %
Neutro Abs: 6.1 10*3/uL (ref 1.7–7.7)
Neutrophils Relative %: 92 %
Platelets: 142 10*3/uL — ABNORMAL LOW (ref 150–400)
RBC: 2.23 MIL/uL — ABNORMAL LOW (ref 4.22–5.81)
RDW: 15.7 % — ABNORMAL HIGH (ref 11.5–15.5)
WBC: 6.7 10*3/uL (ref 4.0–10.5)
nRBC: 0 % (ref 0.0–0.2)

## 2019-08-18 LAB — POCT I-STAT 7, (LYTES, BLD GAS, ICA,H+H)
Acid-Base Excess: 15 mmol/L — ABNORMAL HIGH (ref 0.0–2.0)
Acid-Base Excess: 13 mmol/L — ABNORMAL HIGH (ref 0.0–2.0)
Acid-Base Excess: 19 mmol/L — ABNORMAL HIGH (ref 0.0–2.0)
Acid-Base Excess: 17 mmol/L — ABNORMAL HIGH (ref 0.0–2.0)
Bicarbonate: 38.4 mmol/L — ABNORMAL HIGH (ref 20.0–28.0)
Bicarbonate: 37.8 mmol/L — ABNORMAL HIGH (ref 20.0–28.0)
Bicarbonate: 43.1 mmol/L — ABNORMAL HIGH (ref 20.0–28.0)
Bicarbonate: 41 mmol/L — ABNORMAL HIGH (ref 20.0–28.0)
Calcium, Ion: 0.98 mmol/L — ABNORMAL LOW (ref 1.15–1.40)
Calcium, Ion: 1.04 mmol/L — ABNORMAL LOW (ref 1.15–1.40)
Calcium, Ion: 1.03 mmol/L — ABNORMAL LOW (ref 1.15–1.40)
Calcium, Ion: 0.98 mmol/L — ABNORMAL LOW (ref 1.15–1.40)
HCT: 27 % — ABNORMAL LOW (ref 39.0–52.0)
HCT: 23 % — ABNORMAL LOW (ref 39.0–52.0)
HCT: 21 % — ABNORMAL LOW (ref 39.0–52.0)
HCT: 20 % — ABNORMAL LOW (ref 39.0–52.0)
Hemoglobin: 9.2 g/dL — ABNORMAL LOW (ref 13.0–17.0)
Hemoglobin: 7.8 g/dL — ABNORMAL LOW (ref 13.0–17.0)
Hemoglobin: 7.1 g/dL — ABNORMAL LOW (ref 13.0–17.0)
Hemoglobin: 6.8 g/dL — CL (ref 13.0–17.0)
O2 Saturation: 86 %
O2 Saturation: 94 %
O2 Saturation: 97 %
O2 Saturation: 98 %
Patient temperature: 37
Patient temperature: 37.1
Patient temperature: 37.3
Patient temperature: 37.3
Potassium: 4 mmol/L (ref 3.5–5.1)
Potassium: 3.6 mmol/L (ref 3.5–5.1)
Potassium: 3.8 mmol/L (ref 3.5–5.1)
Potassium: 3.3 mmol/L — ABNORMAL LOW (ref 3.5–5.1)
Sodium: 137 mmol/L (ref 135–145)
Sodium: 139 mmol/L (ref 135–145)
Sodium: 140 mmol/L (ref 135–145)
Sodium: 141 mmol/L (ref 135–145)
TCO2: 40 mmol/L — ABNORMAL HIGH (ref 22–32)
TCO2: 39 mmol/L — ABNORMAL HIGH (ref 22–32)
TCO2: 45 mmol/L — ABNORMAL HIGH (ref 22–32)
TCO2: 42 mmol/L — ABNORMAL HIGH (ref 22–32)
pCO2 arterial: 42 mmHg (ref 32.0–48.0)
pCO2 arterial: 47.2 mmHg (ref 32.0–48.0)
pCO2 arterial: 48.1 mmHg — ABNORMAL HIGH (ref 32.0–48.0)
pCO2 arterial: 47.4 mmHg (ref 32.0–48.0)
pH, Arterial: 7.569 — ABNORMAL HIGH (ref 7.350–7.450)
pH, Arterial: 7.512 — ABNORMAL HIGH (ref 7.350–7.450)
pH, Arterial: 7.562 — ABNORMAL HIGH (ref 7.350–7.450)
pH, Arterial: 7.546 — ABNORMAL HIGH (ref 7.350–7.450)
pO2, Arterial: 45 mmHg — ABNORMAL LOW (ref 83.0–108.0)
pO2, Arterial: 65 mmHg — ABNORMAL LOW (ref 83.0–108.0)
pO2, Arterial: 81 mmHg — ABNORMAL LOW (ref 83.0–108.0)
pO2, Arterial: 92 mmHg (ref 83.0–108.0)

## 2019-08-18 LAB — RENAL FUNCTION PANEL
Albumin: 1.8 g/dL — ABNORMAL LOW (ref 3.5–5.0)
Albumin: 1.7 g/dL — ABNORMAL LOW (ref 3.5–5.0)
Anion gap: 14 (ref 5–15)
Anion gap: 12 (ref 5–15)
BUN: 56 mg/dL — ABNORMAL HIGH (ref 8–23)
BUN: 51 mg/dL — ABNORMAL HIGH (ref 8–23)
CO2: 37 mmol/L — ABNORMAL HIGH (ref 22–32)
CO2: 39 mmol/L — ABNORMAL HIGH (ref 22–32)
Calcium: 8.3 mg/dL — ABNORMAL LOW (ref 8.9–10.3)
Calcium: 8.6 mg/dL — ABNORMAL LOW (ref 8.9–10.3)
Chloride: 92 mmol/L — ABNORMAL LOW (ref 98–111)
Chloride: 92 mmol/L — ABNORMAL LOW (ref 98–111)
Creatinine, Ser: 2.02 mg/dL — ABNORMAL HIGH (ref 0.61–1.24)
Creatinine, Ser: 2.04 mg/dL — ABNORMAL HIGH (ref 0.61–1.24)
GFR calc Af Amer: 39 mL/min — ABNORMAL LOW (ref >60)
GFR calc Af Amer: 38 mL/min — ABNORMAL LOW (ref >60)
GFR calc non Af Amer: 33 mL/min — ABNORMAL LOW (ref >60)
GFR calc non Af Amer: 33 mL/min — ABNORMAL LOW (ref >60)
Glucose, Bld: 164 mg/dL — ABNORMAL HIGH (ref 70–99)
Glucose, Bld: 168 mg/dL — ABNORMAL HIGH (ref 70–99)
Phosphorus: 2.7 mg/dL (ref 2.5–4.6)
Phosphorus: 2.2 mg/dL — ABNORMAL LOW (ref 2.5–4.6)
Potassium: 3.8 mmol/L (ref 3.5–5.1)
Potassium: 3.8 mmol/L (ref 3.5–5.1)
Sodium: 143 mmol/L (ref 135–145)
Sodium: 143 mmol/L (ref 135–145)

## 2019-08-18 LAB — POCT I-STAT EG7
Acid-Base Excess: 10 mmol/L — ABNORMAL HIGH (ref 0.0–2.0)
Bicarbonate: 33.7 mmol/L — ABNORMAL HIGH (ref 20.0–28.0)
Calcium, Ion: 0.41 mmol/L — CL (ref 1.15–1.40)
HCT: 30 % — ABNORMAL LOW (ref 39.0–52.0)
Hemoglobin: 10.2 g/dL — ABNORMAL LOW (ref 13.0–17.0)
O2 Saturation: 67 %
Patient temperature: 37
Potassium: 3.9 mmol/L (ref 3.5–5.1)
Sodium: 139 mmol/L (ref 135–145)
TCO2: 35 mmol/L — ABNORMAL HIGH (ref 22–32)
pCO2, Ven: 43.1 mmHg — ABNORMAL LOW (ref 44.0–60.0)
pH, Ven: 7.501 — ABNORMAL HIGH (ref 7.250–7.430)
pO2, Ven: 32 mmHg (ref 32.0–45.0)

## 2019-08-18 LAB — GLUCOSE, CAPILLARY
Glucose-Capillary: 156 mg/dL — ABNORMAL HIGH (ref 70–99)
Glucose-Capillary: 186 mg/dL — ABNORMAL HIGH (ref 70–99)
Glucose-Capillary: 187 mg/dL — ABNORMAL HIGH (ref 70–99)
Glucose-Capillary: 212 mg/dL — ABNORMAL HIGH (ref 70–99)
Glucose-Capillary: 215 mg/dL — ABNORMAL HIGH (ref 70–99)
Glucose-Capillary: 234 mg/dL — ABNORMAL HIGH (ref 70–99)
Glucose-Capillary: 240 mg/dL — ABNORMAL HIGH (ref 70–99)

## 2019-08-18 LAB — CALCIUM, IONIZED: Calcium, Ionized, Serum: 4.4 mg/dL — ABNORMAL LOW (ref 4.5–5.6)

## 2019-08-18 LAB — PREPARE RBC (CROSSMATCH)

## 2019-08-18 MED ORDER — PRISMASOL B22GK 4/0 22-4 MEQ/L IV SOLN
INTRAVENOUS | Status: DC
  Administered 2019-08-18 – 2019-08-19 (×8): via INTRAVENOUS_CENTRAL

## 2019-08-18 MED ORDER — PRISMASOL BGK 4/2.5 32-4-2.5 MEQ/L IV SOLN
INTRAVENOUS | Status: DC
  Administered 2019-08-18 (×2): via INTRAVENOUS_CENTRAL

## 2019-08-18 MED ORDER — SODIUM CHLORIDE 0.9 % IV BOLUS
500.0000 mL | Freq: Once | INTRAVENOUS | Status: AC
  Administered 2019-08-18: 500 mL via INTRAVENOUS

## 2019-08-18 MED ORDER — SODIUM CHLORIDE 0.9% IV SOLUTION
Freq: Once | INTRAVENOUS | Status: AC
  Administered 2019-08-18: 17:00:00 via INTRAVENOUS

## 2019-08-18 NOTE — Progress Notes (Addendum)
Patient is tachypneic to the 40s-50s, unchanged by fentanyl pushes. Synchronous with vent. Nasal flaring and retractions observed with increased WOB, however oxygen is 97%. Elink notified, orders received for CXR and ABG  Labile BP, with art line pressures ranging from SBP 60s-110s with drops and rises occurring suddenly without any noticeable trigger. Levophed restarted, currently infusing at 70mcg

## 2019-08-18 NOTE — Progress Notes (Signed)
ETT flushed with 10 mL normal saline. Small, thick, tan plugs obtained

## 2019-08-18 NOTE — Progress Notes (Signed)
NAME:  [name], MRN:  [id number], DOB:  1950/11/29, LOS: 24 ADMISSION DATE:  07/25/2019, CONSULTATION DATE:  10/3 REFERRING MD:  Nadara Mustard, CHIEF COMPLAINT:  Dyspnea   Brief History   68 yo Albert dx with COVID 07/22/19 presented to [facility] ER 07/25/19 with progressive dyspnea and hypoxia requiring intubation from COVID 19 pneumonia.  Past Medical History  GERD  Significant Hospital Events   10/03 Admit to [facility] from [facility] ER, start decadron and remdesivir, given tociluzimab; prone positioning 10/04 convalescent plasma 10/05 stop prone positioning 10/06 convalescent plasma; prone positioning again 10/09 start ABx 10/10 MSSA bacteremia, CVL d/ced; ID consulted; increase OG tube outpt 10/11 vent weaning trial started 10/13 fever, pneumothorax, pig tail chest tube placed 10/14 worsening hypotension and renal fx >> consulted nephrology; worsening PTX >> replaced chest tube; GNR in sputum >> ABx changed 10/15 start CRRT; endobronchial blocker placed for persistent air leak 10/16 endobronchial blocker repositioned, changed chest tube to water seal; melana with ABLA >> GI consulted; transfuse PRBC 10/17 persistent air leak, increased WOB; start nimbex gtt >> air leak decreased; EGD; A fib with RVR >> start amiodarone 10/18 transfuse PRBC; GI s/o 10/20 resume heparin gtt 10/21 stopped nimbex, chest tube to suction, stopped heparin drip because Hgb dropped again 10/22 TPA in chest tube, repositioned endobronchial blocker 10/23 deflated endobronchial balloon 10/24 small air leak  10/25 removed bronchial blocker  Consults:  ID Nephrology Gastroenterology PCCM  Procedures:  10/2 ETT>  10/2 R IJ CVL > 10/10 10/13 Rt pig tail chest tube >> 10/14 10/14 R 108F chest tube >  10/14 R subclavian CVL >  10/14 L IJ HD cath >  10/14 Lt radial a line >   Significant Diagnostic Tests:  10/2 CT angiogram chest > extensive bilateral airspace disease predominantly posterior 10/8 doppler legs b/l >  no DVT 10/15 renal u/s > normal 10/17 EGD > non bleeding gastric ulcer  10/19 Echo >> EF 60 to 65%, hyperdynamic LV with small LVOT gradient 10/22 CT chest > large collection of air in pleural space with fluid, pneumonia bilaterally R>L endobronchial blocker in place, chest tube anterior  Micro Data:  9/20 SARS COV 2 > POSITIVE 10/2 blood > negative 10/9 blood > MSSA 2/4 10/9 resp > MSSA, Pseudomonas 10/13 blood >> negative 10/21 bronch wash bacterial > pseduomonas, acinetobacter baumannii 10/21 bronch wash fungal >  10/21 bronch wash aspergillus antigen> negative 10/24 bronchoscopy wash>   Antimicrobials:  10/3 remdesivir > 10/6 10/3 actemra 10/3 decadron > 10/12 10/4 convalescent plasma 10/6 convalescent plasma  10/9 vancomycin > 10/10 10/9 cefepime > 10/10 10/10 ancef > 10/13 10/13 cefepime > 10/20 10/14 anidulofungin > 10/15  10/20 ancef > 10/23 10/23 meropenem >   10/21 anidulofungin> 10/22 10/22 voriconazole > 10/23  Interim history/subjective:   Remains off vasopressors Some periods of agitation overnight, tachypnea Was on pressure support most of the day yesterday Metabolic alkalosis noted on ABG  Objective   Blood pressure (!) 88/58, pulse (!) 117, temperature 98.2 F (36.8 C), temperature source Esophageal, resp. rate (!) 35, height 5\' 10"  (1.778 m), weight 85.3 kg, SpO2 98 %. CVP:  [0 mmHg-4 mmHg] 1 mmHg  Vent Mode: PSV;CPAP FiO2 (%):  [40 %] 40 % Set Rate:  [24 bmp] 24 bmp Vt Set:  [480 mL] 480 mL PEEP:  [8 cmH20] 8 cmH20 Pressure Support:  [12 cmH20] 12 cmH20 Plateau Pressure:  [14 cmH20] 14 cmH20   Intake/Output Summary (Last 24 hours) at 08/18/2019 1047  Last data filed at 08/18/2019 1000 Gross per 24 hour  Intake 3508.54 ml  Output 4180 ml  Net -671.46 ml   Filed Weights   08/16/19 0500 08/17/19 0440 08/18/19 0500  Weight: 86.4 kg 83.7 kg 85.3 kg    Examination:  General:  In bed on vent HENT: NCAT ETT in place PULM: Rhonchi loud  on right, clear on left, vent supported breathing CV: RRR, no mgr GI: BS+, soft, nontender MSK: normal bulk and tone Neuro: opens eyes to voice, doesn't move arms or legs  No air leak on chest tube today  10/26 CXR images personally reviewed: improving bilateral airspace disease, small pneumothorax on R, chest tube in place  Resolved Hospital Problem list   Atrial fibrillation Septic shock  Assessment & Plan:  ARDS due to COVID-19 pneumonia: improved oxygenation Ventilatory synchrony best with pressure control, not changed much with sedation Minimize sedation pressure support as long as tolerated today Wean PEEP/FiO2 for SaO2 > 85% VAP prevention Tracheostomy  R Bronchopleural fistula, persistent air leak 10/26, bronchial blocker was up for over a week, fistula didn't heal so removed blocker to reduce likelihood of repeated episodes of pneumonia Suspect that endobronchial valve would be associated with more pneumonia like endobronchial blocker Likely necrotic RLL, unclear if fistula will heal Empyema R Continue chest wall to suction as per advice from thoracic surgery Will eventually need consideration of VATS, but would wait to see how well he wakes up first before we consider that Monitor ABG/CXR  Acute kidney injury: Metabolic alklalosis on ABG from 10/26 Discuss alkalosis with renal Continue CVVHD Monitor BMET and UOP Replace electrolytes as needed  Need for sedation/mechanical ventilation RASS target 0 to -1 Neuromuscular weakness Intermittent tachypnea: have seen this with many COVID patients, doesn't seem to be affected by medications RASS target 0 to -1 Continue fentanyl patch, minimize intermittent sedation unless causing significantly high pressures on vent with dyssychrony  Gastric ulcer: PPI  Anemia, acute hemorrhagic: Hgb down again on 10/23, spurious? Subcutaneous heparin for DVT prophylaxis  MSSA bacteremia Acinetobacter and Pseudomonal pneumonia>  RLL necrotic? Continue meropenem for 7 days, re-assess at day 7 as may need more   Best practice:  Diet: tube feeding Pain/Anxiety/Delirium protocol (if indicated): as above VAP protocol (if indicated): yes DVT prophylaxis: prophylactic dosing heparin GI prophylaxis: pantoprazole Glucose control: SSI Mobility: bed rest Code Status: DNR Family Communication: I updated [name] today by phone Disposition: remain in ICU  Labs   CBC: Recent Labs  Lab 08/14/19 0530  08/15/19 0500  08/15/19 1510  08/16/19 0430  08/17/19 0515  08/18/19 0100 08/18/19 0320 08/18/19 0355 08/18/19 0921 08/18/19 0927  WBC 10.8*  --  7.5  --  10.2  --  9.0  --  8.2  --   --   --  6.7  --   --   NEUTROABS 10.1*  --  6.9  --   --   --  8.3*  --  7.7  --   --   --  6.1  --   --   HGB 7.9*   < > 7.3*   < > 8.6*   < > 8.3*   < > 7.5*   < > 7.1* 7.1* 6.8* 6.8* 7.1*  HCT 25.4*   < > 23.1*   < > 26.9*   < > 26.6*   < > 23.8*   < > 21.0* 21.0* 21.4* 20.0* 21.0*  MCV 98.4  --  96.3  --  95.7  --  96.0  --  95.6  --   --   --  96.0  --   --   PLT 130*  --  122*  --  149*  --  129*  --  127*  --   --   --  142*  --   --    < > = values in this interval not displayed.    Basic Metabolic Panel: Recent Labs  Lab 08/12/19 0500  08/13/19 0500  08/14/19 0530  08/15/19 0500  08/16/19 0430  08/16/19 1655  08/17/19 0514  08/17/19 1525  08/18/19 0057 08/18/19 0100 08/18/19 0320 08/18/19 0355 08/18/19 0921 08/18/19 0927  NA 140  140   < > 135  134*   < > 136   < > 139   < > 139   < > 139   < > 141   < > 141   < > 141 140 140 143 140 141  K 4.0  4.0   < > 4.5  4.5   < > 4.6   < > 3.9   < > 4.1   < > 4.1   < > 3.9   < > 3.9   < > 3.5 3.6 3.8 3.8 3.9 3.7  CL 95*  94*   < > 97*  96*   < > 95*   < > 92*   < > 93*  --  96*   < > 93*   < > 92*   < > 88* 88*  --  92* 89* 88*  CO2 33*  32   < > 27  27   < > 27   < > 34*   < > 33*  --  32  --  34*  --  36*  --   --   --   --  37*  --   --   GLUCOSE 215*  216*   < >  113*  112*   < > 237*   < > 147*   < > 132*  --  203*   < > 181*   < > 193*   < > 244* 208*  --  164* 182* 227*  BUN 45*  46*   < > 44*  43*   < > 49*   < > 45*   < > 36*  --  39*   < > 48*   < > 48*   < > 37* 45*  --  56* 43* 36*  CREATININE 1.89*  1.91*   < > 1.86*  1.79*   < > 2.39*   < > 2.19*   < > 2.23*  --  2.09*   < > 2.10*   < > 2.08*   < > 1.40* 2.00*  --  2.02* 1.90* 1.50*  CALCIUM 7.3*  7.3*   < > 7.9*  7.9*   < > 7.6*   < > 7.7*   < > 7.8*  --  7.9*  --  8.2*  --  8.3*  --   --   --   --  8.3*  --   --   MG 2.1  --  2.4  --  2.3  --  2.1  --   --   --   --   --   --   --   --   --   --   --   --   --   --   --  PHOS 2.6   < > 3.4   < > 5.6*   < > 3.3   < > 3.3  --  3.1  --  3.1  --  2.9  --   --   --   --  2.7  --   --    < > = values in this interval not displayed.   GFR: Estimated Creatinine Clearance: 50 mL/min (A) (by C-G formula based on SCr of 1.5 mg/dL (H)). Recent Labs  Lab 08/15/19 1510 08/16/19 0430 08/17/19 0515 08/18/19 0355  WBC 10.2 9.0 8.2 6.7    Liver Function Tests: Recent Labs  Lab 08/12/19 0500  08/13/19 0500  08/14/19 0530  08/15/19 0500  08/16/19 0430 08/16/19 1655 08/17/19 0514 08/17/19 1525 08/18/19 0355  AST 32  --  24  --  25  --  29  --  38  --   --   --   --   ALT 9  --  9  --  8  --  6  --  8  --   --   --   --   ALKPHOS 63  --  52  --  61  --  59  --  71  --   --   --   --   BILITOT 0.5  --  0.5  --  0.8  --  0.3  --  0.6  --   --   --   --   PROT 5.9*  --  6.2*  --  6.2*  --  6.0*  --  6.2*  --   --   --   --   ALBUMIN 2.1*  2.1*   < > 2.8*  2.8*   < > 2.4*  2.4*   < > 2.2*  2.2*   < > 2.1*  2.2* 1.9* 2.0* 1.8* 1.8*   < > = values in this interval not displayed.   No results for input(s): LIPASE, AMYLASE in the last 168 hours. No results for input(s): AMMONIA in the last 168 hours.  ABG    Component Value Date/Time   PHART 7.562 (H) 08/18/2019 0320   PCO2ART 48.1 (H) 08/18/2019 0320   PO2ART 81.0 (L) 08/18/2019  0320   HCO3 43.1 (H) 08/18/2019 0320   TCO2 34 (H) 08/18/2019 0927   ACIDBASEDEF 2.0 08/14/2019 0515   O2SAT 97.0 08/18/2019 0320     Coagulation Profile: No results for input(s): INR, PROTIME in the last 168 hours.  Cardiac Enzymes: No results for input(s): CKTOTAL, CKMB, CKMBINDEX, TROPONINI in the last 168 hours.  HbA1C: Hgb A1c MFr Bld  Date/Time Value Ref Range Status  07/26/2019 05:00 AM 6.1 (H) 4.8 - 5.6 % Final    Comment:    (NOTE) Pre diabetes:          5.7%-6.4% Diabetes:              >6.4% Glycemic control for   <7.0% adults with diabetes     CBG: Recent Labs  Lab 08/17/19 1516 08/17/19 2001 08/17/19 2356 08/18/19 0329 08/18/19 0815  GLUCAP 173* 250* 240* 215* 187*       Critical care time: 33 minutes      Roselie Awkward, MD [place] [facility] Pager: [phone] Cell: [phone] If no response, call [phone]

## 2019-08-18 NOTE — Progress Notes (Signed)
ETT flushed with 10 mL normal saline followed by suctioning.  Tan thick secretions with three large plugs removed.  Patient tolerated well.

## 2019-08-18 NOTE — Progress Notes (Signed)
[name]  GNF:[id number] DOB: [birth year] DOA: 07/25/2019 PCP: Kathyrn Drown, MD    Brief Narrative:  67 year old with a history of GERD and BPH who presented to [facility], ED with S OB and was found to be hypoxic.  He was initially diagnosed with COVID-19 September 20.  1 to 2 days prior to his presentation he developed worsening shortness of breath with anorexia.  His wife's physician during a teleconference call noted that the patient himself is very tachypneic and ultimately EMS was sent to the house.  EMS found the patient to have saturations in the 40s.  In the ED on nonrebreather the patient sats only improved to the 80s.  The patient was severely confused and required emergent intubation.  Chest x-ray confirmed multifocal infiltrates.  CT chest was negative for PE  Significant Events: 10/3 admit to [facility] 10/13 right pneumothorax -pigtail chest tube placed 10/14 Nephrology consulted 10/15 begin CRRT -endobronchial blocker placed 10/16 melena 10/17 A. fib with RVR -EGD 10/18 transfuse PRBC 10/20 Heparin drip resumed 10/21 abrupt drop in hemoglobin -transfuse 1 unit PRBC 10/23 tPA per chest tube  10/25 endobronchial blocker removed  10/26 1U PRBC  COVID-19 specific Treatment: Decadron 10/2 > 10/12 Remdesivir 10/2 > 10/6 Actemra 10/2 Convalescent plasma 10/3  Subjective: Sedated on ventilator.   Assessment & Plan:  Covid pneumonia - acute hypoxic respiratory failure Has completed a course of decadron and remdesivir - was dosed with actemra and convalescent plasma - pulmonary status remains very complicated and tenuous - ventilator management per PCCM (appears to do best on pressure control)  Pseudomonas and Acinetobacter in BAL specimen Cont meropenem to complete at least 7 days of treatment and then reconsider  Acute renal failure CRRT dependent - Nephrology following  GI bleed - gastric ulcer - acute blood loss anemia Status post EGD with  clipping of vessel in large ulcer - full anticoagulation was resumed 10/20 following which he experienced an abrupt drop in his hemoglobin - he was transfused 1 unit 10/21 - anticoagulation stopped 10/21 - resumption of DVT prophylaxis 10/24 - suspect he has slow intermittent GI leaking - follow trend - transfuse 1U PRBC today - attempt to continue DVT prophy anticoag for now   Recent Labs  Lab 08/18/19 0921 08/18/19 0927 08/18/19 1242  HGB 6.8* 7.1* 6.8*    Markedly elevated D-dimer signif increase noted 10/19 - unable to fully anticoag at present due to concern for GIB - no evidence of DVT on dopplers 10/8 - likely related to suspected empyema right lung  Pneumothorax / bronchopleural fistula right - empyema CT chest 10/21 noted findings most consistent with an empyema as well as a persisting pneumothorax - bedside bronchoscopy revealed significant material in the airway -TPA infused via chest tube 10/23 - chest tube management per PCCM - endobronchial blocker removed 10/25 - suspect pt will eventually need VATS to address RLL   Acute transient atrial fibrillation with RVR No known prior history of atrial fibrillation -currently in sinus tachycardia-unable to fully anticoagulate due to possible recurrent GI bleeding -continue to follow on telemetry  MSSA bacteremia 2/2 blood cultures 10/9 - septic shock Completed a course of antibiotic for this indication -shock/hypotension resolved  HTN Blood pressure reasonably controlled at this time -follow without change today  BPH  DVT prophylaxis: Subcutaneous heparin Code Status: NO CODE - DNR Family Communication: per PCCM   Disposition Plan: ICU  Consultants:  PCCM Nephrology GI  Antimicrobials:  10/9 vancomycin >  10/10 10/9 cefepime > 10/10 10/10 ancef > 10/13 10/13 cefepime > 10/20 10/14 anidulofungin > 10/15 Cefazolin 10/20 > 10/23 Meropenem 10/23 >  Objective: Blood pressure 108/68, pulse (!) 118, temperature 98.8 F  (37.1 C), temperature source Esophageal, resp. rate (!) 37, height _0  (1.778 m), weight 85.3 kg, SpO2 97 %.  Intake/Output Summary (Last 24 hours) at 08/18/2019 1537 Last data filed at 08/18/2019 1400 Gross per 24 hour  Intake 3874.01 ml  Output 3888 ml  Net -13.99 ml   Filed Weights   08/16/19 0500 08/17/19 0440 08/18/19 0500  Weight: 86.4 kg 83.7 kg 85.3 kg    Examination: General: Sedated on vent Lungs: No air movement right base -fine crackles throughout other fields w/o change   Cardiovascular: Tachycardic but regular w/o rub  Abdomen: Soft, BS+, no appreciable mass Extremities: trace edema B LE   CBC: Recent Labs  Lab 08/16/19 0430  08/17/19 0515  08/18/19 0355 08/18/19 0921 08/18/19 0927 08/18/19 1242  WBC 9.0  --  8.2  --  6.7  --   --   --   NEUTROABS 8.3*  --  7.7  --  6.1  --   --   --   HGB 8.3*   < > 7.5*   < > 6.8* 6.8* 7.1* 6.8*  HCT 26.6*   < > 23.8*   < > 21.4* 20.0* 21.0* 20.0*  MCV 96.0  --  95.6  --  96.0  --   --   --   PLT 129*  --  127*  --  142*  --   --   --    < > = values in this interval not displayed.   Basic Metabolic Panel: Recent Labs  Lab 08/13/19 0500  08/14/19 0530  08/15/19 0500  08/17/19 0514  08/17/19 1525  08/18/19 0355 08/18/19 0921 08/18/19 0927 08/18/19 1242  NA 135  134*   < > 136   < > 139   < > 141   < > 141   < > 143 140 141 141  K 4.5  4.5   < > 4.6   < > 3.9   < > 3.9   < > 3.9   < > 3.8 3.9 3.7 3.3*  CL 97*  96*   < > 95*   < > 92*   < > 93*   < > 92*   < > 92* 89* 88*  --   CO2 27  27   < > 27   < > 34*   < > 34*  --  36*  --  37*  --   --   --   GLUCOSE 113*  112*   < > 237*   < > 147*   < > 181*   < > 193*   < > 164* 182* 227*  --   BUN 44*  43*   < > 49*   < > 45*   < > 48*   < > 48*   < > 56* 43* 36*  --   CREATININE 1.86*  1.79*   < > 2.39*   < > 2.19*   < > 2.10*   < > 2.08*   < > 2.02* 1.90* 1.50*  --   CALCIUM 7.9*  7.9*   < > 7.6*   < > 7.7*   < > 8.2*  --  8.3*  --  8.3*  --   --   --  MG 2.4  --  2.3  --  2.1  --   --   --   --   --   --   --   --   --   PHOS 3.4   < > 5.6*   < > 3.3   < > 3.1  --  2.9  --  2.7  --   --   --    < > = values in this interval not displayed.   GFR: Estimated Creatinine Clearance: 50 mL/min (A) (by C-G formula based on SCr of 1.5 mg/dL (H)).  Liver Function Tests: Recent Labs  Lab 08/13/19 0500  08/14/19 0530  08/15/19 0500  08/16/19 0430 08/16/19 1655 08/17/19 0514 08/17/19 1525 08/18/19 0355  AST 24  --  25  --  29  --  38  --   --   --   --   ALT 9  --  8  --  6  --  8  --   --   --   --   ALKPHOS 52  --  61  --  59  --  71  --   --   --   --   BILITOT 0.5  --  0.8  --  0.3  --  0.6  --   --   --   --   PROT 6.2*  --  6.2*  --  6.0*  --  6.2*  --   --   --   --   ALBUMIN 2.8*  2.8*   < > 2.4*  2.4*   < > 2.2*  2.2*   < > 2.1*  2.2* 1.9* 2.0* 1.8* 1.8*   < > = values in this interval not displayed.    HbA1C: Hgb A1c MFr Bld  Date/Time Value Ref Range Status  07/26/2019 05:00 AM 6.1 (H) 4.8 - 5.6 % Final    Comment:    (NOTE) Pre diabetes:          5.7%-6.4% Diabetes:              >6.4% Glycemic control for   <7.0% adults with diabetes     CBG: Recent Labs  Lab 08/17/19 2356 08/18/19 0329 08/18/19 0815 08/18/19 1152 08/18/19 1515  GLUCAP 240* 215* 187* 186* 156*    Recent Results (from the past 240 hour(s))  Culture, respiratory (non-expectorated)     Status: None   Collection Time: 08/13/19 11:30 AM   Specimen: Bronchoalveolar Lavage; Respiratory  Result Value Ref Range Status   Specimen Description   Final    BRONCHIAL ALVEOLAR LAVAGE Performed at [facility], [address], [place], [place] [address]    Special Requests   Final    Normal Performed at [facility] [address], [place], [place] [address]    Gram Stain   Final    MODERATE WBC PRESENT,BOTH PMN AND MONONUCLEAR NO ORGANISMS SEEN Performed at [facility], [place] [address] [place], [place]  [address]    Culture   Final    FEW PSEUDOMONAS AERUGINOSA FEW ACINETOBACTER CALCOACETICUS/BAUMANNII COMPLEX    Report Status 08/15/2019 FINAL  Final   Organism ID, Bacteria PSEUDOMONAS AERUGINOSA  Final   Organism ID, Bacteria ACINETOBACTER CALCOACETICUS/BAUMANNII COMPLEX  Final      Susceptibility   Acinetobacter calcoaceticus/baumannii complex - MIC*    CEFTAZIDIME 8 SENSITIVE Sensitive     CEFTRIAXONE 16 INTERMEDIATE Intermediate  CIPROFLOXACIN 0.5 SENSITIVE Sensitive     GENTAMICIN 4 SENSITIVE Sensitive     IMIPENEM <=0.25 SENSITIVE Sensitive     PIP/TAZO 16 SENSITIVE Sensitive     TRIMETH/SULFA <=20 SENSITIVE Sensitive     CEFEPIME 16 INTERMEDIATE Intermediate     AMPICILLIN/SULBACTAM <=2 SENSITIVE Sensitive     * FEW ACINETOBACTER CALCOACETICUS/BAUMANNII COMPLEX   Pseudomonas aeruginosa - MIC*    CEFTAZIDIME 4 SENSITIVE Sensitive     CIPROFLOXACIN <=0.25 SENSITIVE Sensitive     GENTAMICIN <=1 SENSITIVE Sensitive     IMIPENEM 1 SENSITIVE Sensitive     PIP/TAZO 8 SENSITIVE Sensitive     CEFEPIME <=1 SENSITIVE Sensitive     * FEW PSEUDOMONAS AERUGINOSA  Fungus Culture With Stain     Status: None   Collection Time: 08/13/19 11:30 AM  Result Value Ref Range Status   Fungus Stain Final report  Final    Comment: (NOTE) Performed At: [facility] [place], [place] [id number] Rush Farmer MD IW:[id number]    Fungus (Mycology) Culture PENDING  Incomplete   Fungal Source BRONCHIAL ALVEOLAR LAVAGE  Corrected    Comment: Performed at [facility], [place] [address] [place], [place] [address] CORRECTED ON 10/21 AT 1659: PREVIOUSLY REPORTED AS TRACHEAL ASPIRATE   Aspergillus Ag, BAL/Serum     Status: None   Collection Time: 08/13/19 11:30 AM  Result Value Ref Range Status   Aspergillus Ag, BAL/Serum 0.06 0.00 - 0.49 Index Final    Comment: (NOTE) Performed At: [facility] [address] [place], [place] [id number] Rush Farmer MD  JS:[id number] Performed At: [facility] [place], [place] [id number] Katina Degree MDPhD IN:[id number]   Fungus Culture Result     Status: None   Collection Time: 08/13/19 11:30 AM  Result Value Ref Range Status   Result 1 Comment  Final    Comment: (NOTE) KOH/Calcofluor preparation:  no fungus observed. Performed At: [facility] [place], [place] [id number] Rush Farmer MD HU:[id number]   Expectorated sputum assessment w rflx to resp cult     Status: None   Collection Time: 08/16/19  9:25 AM   Specimen: Expectorated Sputum  Result Value Ref Range Status   Specimen Description EXPECTORATED SPUTUM  Final   Special Requests Normal  Final   Sputum evaluation   Final    THIS SPECIMEN IS ACCEPTABLE FOR SPUTUM CULTURE Performed at [facility], [place] [address] [place], [place] [address]    Report Status 08/16/2019 FINAL  Final  Culture, respiratory     Status: None   Collection Time: 08/16/19  9:25 AM  Result Value Ref Range Status   Specimen Description   Final    EXPECTORATED SPUTUM Performed at [facility] [address], [place], [place] [address]    Special Requests   Final    Normal Reflexed from [phone] Performed at [facility], [place] [address] [place], [place] [address]    Gram Stain   Final    ABUNDANT WBC PRESENT, PREDOMINANTLY PMN NO ORGANISMS SEEN Performed at [facility], [place] [address], [place],  [address]    Culture FEW PSEUDOMONAS AERUGINOSA  Final   Report Status 08/18/2019 FINAL  Final   Organism ID, Bacteria PSEUDOMONAS AERUGINOSA  Final      Susceptibility   Pseudomonas aeruginosa - MIC*    CEFTAZIDIME 4 SENSITIVE Sensitive     CIPROFLOXACIN <=0.25 SENSITIVE Sensitive  GENTAMICIN <=1 SENSITIVE Sensitive     IMIPENEM 1 SENSITIVE Sensitive     PIP/TAZO 8 SENSITIVE Sensitive     CEFEPIME 4 SENSITIVE Sensitive     * FEW PSEUDOMONAS  AERUGINOSA     Scheduled Meds: . chlorhexidine  15 mL Mouth/Throat BID  . Chlorhexidine Gluconate Cloth  6 each Topical Daily  . doxazosin  2 mg Per Tube Daily  . feeding supplement (PRO-STAT SUGAR FREE 64)  60 mL Per Tube TID  . fentaNYL  1 patch Transdermal Q72H  . heparin injection (subcutaneous)  7,500 Units Subcutaneous Q8H  . insulin aspart  0-9 Units Subcutaneous Q4H  . mouth rinse  15 mL Mouth Rinse 10 times per day  . pantoprazole (PROTONIX) IV  40 mg Intravenous Q12H  . polyethylene glycol  17 g Per Tube QHS  . sennosides  5 mL Per Tube QHS  . sucralfate  1 g Oral Q6H   Continuous Infusions: .  prismasol BGK 4/2.5 200 mL/hr at 08/17/19 2214  . sodium chloride Stopped (08/16/19 0431)  . sodium chloride 5 mL/hr at 08/18/19 1400  . calcium gluconate infusion for CRRT 20 g (08/18/19 0007)  . dextrose Stopped (08/16/19 1623)  . feeding supplement (PIVOT 1.5 CAL) 50 mL/hr at 08/18/19 0600  . meropenem (MERREM) IV Stopped (08/18/19 0412)  . norepinephrine (LEVOPHED) Adult infusion 8 mcg/min (08/18/19 1400)  . prismasol BGK 4/2.5 1,800 mL/hr at 08/18/19 1308  . sodium citrate 2 %/dextrose 2.5% solution 3000 mL 460 mL/hr at 08/18/19 0854     LOS: 24 days   Cherene Altes, MD [facility] Office  [phone] Pager - Text Page per Amion  If 7PM-7AM, please contact night-coverage per Amion 08/18/2019, 3:37 PM

## 2019-08-18 NOTE — Progress Notes (Signed)
[facility] Progress Note    68 year old gentleman who was admitted with Covid pneumonia July 13, 2019 completed course of remdesivir convalescent plasma Decadron treated with vancomycin and cefepime from 08/01/2019 until 08/02/2019. On 08/02/2019 patient developed an ileus. His creatinine on 07/25/2019 was 1.5 which appears to be his baseline. On 08/02/2019 started to increase. On 08/02/2019 was found to have MSSA bacteremia and was started on Ancef. His blood pressure has been marginal with drops in blood pressure to 70 mmHg. His renal ultrasound showed no hydronephrosis on 08/07/2019. Pt had administration of intravenous contrast on 07/25/2019.  Levo gtt off at this time.  Assessment/ Plan:    Acute kidney injury: on CKD w/ baseline creat of 1.5. Admitted with Covid pneumonia complicated course with recent history of MSSA bacteremia. Developed shock hypotension and required pressors. No evidence of hydronephrosis on renal ultrasound. Suspected ATN.There was administration of contrast but this was early on the course of his illness on 07/25/2019.Formetabolic acidosis was started on CRRT 08/07/2019. No signs renal of recovery at this time.  Seen on CVVHDF - Net even for now ? Getting citrate local a/c protocol ? Keeping even w/ CRRT -> no changes for now. Doesn't appear to be volume overloaded ? Alkalotic despite low bicarb dialysate, this is a known complication of long-term citrate exposure; appears that IV heparin is not an option, will d/w CCM. Options are stop citrate vs try IV HCl w/ pharm assist  Anemia / sp GIB - as per primary team transfuse as necessary  COVID-19 pneumonia- Treated with remdesivir, dexamethasone and convalescent plasma  Afib / RVR - new, IV amio  GIB - acute, sp EGD 10/17 w/ large gastric ulcer/ w visible vessel, Rx'd by clipping  VDRF/ R PTX w/ chest tube  Shock - Off pressors at this time   MSSA bacteremia/ ? Fungal airway  infectino - on IV Ancef and antifungals  [name], MD 08/18/2019, 4:20 PM   Subjective:   Off pressors and tolerating CRRT.   Objective:   BP 108/68   Pulse (!) 118   Temp 98.8 F (37.1 C) (Esophageal)   Resp (!) 37   Ht 5\' 10"  (1.778 m)   Wt 85.3 kg   SpO2 97%   BMI 26.98 kg/m   Intake/Output Summary (Last 24 hours) at 08/18/2019 1553 Last data filed at 08/18/2019 1400 Gross per 24 hour  Intake 3874.01 ml  Output 3888 ml  Net -13.99 ml   Weight change: 1.6 kg  Physical Exam: Seen in ICU, on vent, sedated L IJ temp HD cath No jvd Chest cta bilat Cor reg no RG Abd soft obese Ext  no LE edema Neuro sedated   Imaging: Dg Chest Port 1 View  Result Date: 08/18/2019 CLINICAL DATA:  Follow-up endotracheal tube EXAM: PORTABLE CHEST 1 VIEW COMPARISON:  08/17/2019 FINDINGS: Cardiac shadow is stable. Right subclavian central line is noted in satisfactory position. Endotracheal tube and gastric catheter are again seen and stable. Left jugular dialysis catheter is noted. Patchy opacities are again seen in both lungs. Right chest tube is again noted with loculated hydropneumothorax stable from the prior exam. IMPRESSION: Overall stable appearance of the chest. Electronically Signed   By: Inez Catalina M.D.   On: 08/18/2019 03:09   Dg Chest Port 1 View  Result Date: 08/17/2019 CLINICAL DATA:  Right-sided pneumothorax EXAM: PORTABLE CHEST 1 VIEW COMPARISON:  08/16/2019 FINDINGS: No significant interval change in AP portable examination with right-sided chest tube in position and  a small, persistent, loculated appearing hydropneumothorax about the right lung base. There is redemonstrated, extensive bilateral dense heterogeneous and interstitial airspace opacity, worse on the right. Support apparatus including endotracheal tubes, esophagogastric tube, left neck and right subclavian vascular catheters are unchanged. Cardiomegaly. IMPRESSION: 1. No significant  interval change in AP portable examination with right-sided chest tube in position and a small, persistent, loculated appearing hydropneumothorax about the right lung base. 2. There is redemonstrated, extensive bilateral dense heterogeneous and interstitial airspace opacity, worse on the right, consistent with multifocal infection. 3.  Unchanged support apparatus. Electronically Signed   By: Eddie Candle M.D.   On: 08/17/2019 15:54   Dg Chest Port 1 View  Result Date: 08/16/2019 CLINICAL DATA:  68 year old male with positive COVID-19. EXAM: PORTABLE CHEST 1 VIEW COMPARISON:  Earlier radiograph dated 08/16/2019 FINDINGS: Support lines and tubes as seen previously. Diffuse bilateral airspace opacities similar to prior radiograph. Apparent area of lucency at the right costophrenic angle may be artifactual and related to skin fold or represent a small residual right pneumothorax. Stable cardiac silhouette. No acute osseous pathology. IMPRESSION: 1. Artifact versus small residual right pneumothorax at the right costophrenic angle. 2. Diffuse bilateral airspace opacities similar to prior radiograph. 3. Support lines and tubes as seen previously. Electronically Signed   By: Anner Crete M.D.   On: 08/16/2019 21:01    Labs: BMET Recent Labs  Lab 08/15/19 0500  08/15/19 1510  08/16/19 0430  08/16/19 1655  08/17/19 0514  08/17/19 1525 08/17/19 2301 08/17/19 2307 08/18/19 0057 08/18/19 0100 08/18/19 0320 08/18/19 0355 08/18/19 0921 08/18/19 0927 08/18/19 1242  NA 139   < > 139   < > 139   < > 139   < > 141   < > 141 142 140 141 140 140 143 140 141 141   K 3.9   < > 4.0   < > 4.1   < > 4.1   < > 3.9   < > 3.9 3.5 3.6 3.5 3.6 3.8 3.8 3.9 3.7 3.3*  CL 92*  --  94*   < > 93*  --  96*   < > 93*   < > 92* 87* 91* 88* 88*  --  92* 89* 88*  --   CO2 34*  --  33*  --  33*  --  32  --  34*  --  36*  --   --   --   --   --  37*  --   --   --   GLUCOSE 147*  --  144*   < > 132*  --  203*   < > 181*   < >  193* 253* 212* 244* 208*  --  164* 182* 227*  --   BUN 45*  --  38*   < > 36*  --  39*   < > 48*   < > 48* 40* 43* 37* 45*  --  56* 43* 36*  --   CREATININE 2.19*  --  1.96*   < > 2.23*  --  2.09*   < > 2.10*   < > 2.08* 1.60* 1.90* 1.40* 2.00*  --  2.02* 1.90* 1.50*  --   CALCIUM 7.7*  --  7.8*  --  7.8*  --  7.9*  --  8.2*  --  8.3*  --   --   --   --   --  8.3*  --   --   --  PHOS 3.3  --  3.1  --  3.3  --  3.1  --  3.1  --  2.9  --   --   --   --   --  2.7  --   --   --    < > = values in this interval not displayed.   CBC Recent Labs  Lab 08/15/19 0500  08/15/19 1510  08/16/19 0430  08/17/19 0515  08/18/19 0355 08/18/19 0921 08/18/19 0927 08/18/19 1242  WBC 7.5  --  10.2  --  9.0  --  8.2  --  6.7  --   --   --   NEUTROABS 6.9  --   --   --  8.3*  --  7.7  --  6.1  --   --   --   HGB 7.3*   < > 8.6*   < > 8.3*   < > 7.5*   < > 6.8* 6.8* 7.1* 6.8*  HCT 23.1*   < > 26.9*   < > 26.6*   < > 23.8*   < > 21.4* 20.0* 21.0* 20.0*  MCV 96.3  --  95.7  --  96.0  --  95.6  --  96.0  --   --   --   PLT 122*  --  149*  --  129*  --  127*  --  142*  --   --   --    < > = values in this interval not displayed.    Medications:    . sodium chloride   Intravenous Once  . chlorhexidine  15 mL Mouth/Throat BID  . Chlorhexidine Gluconate Cloth  6 each Topical Daily  . doxazosin  2 mg Per Tube Daily  . feeding supplement (PRO-STAT SUGAR FREE 64)  60 mL Per Tube TID  . fentaNYL  1 patch Transdermal Q72H  . heparin injection (subcutaneous)  7,500 Units Subcutaneous Q8H  . insulin aspart  0-9 Units Subcutaneous Q4H  . mouth rinse  15 mL Mouth Rinse 10 times per day  . pantoprazole (PROTONIX) IV  40 mg Intravenous Q12H  . polyethylene glycol  17 g Per Tube QHS  . sennosides  5 mL Per Tube QHS  . sucralfate  1 g Oral Q6H

## 2019-08-18 NOTE — Progress Notes (Signed)
Spoke with patient's wife, [name] and provided full update/answered all questions.

## 2019-08-19 ENCOUNTER — Inpatient Hospital Stay (HOSPITAL_COMMUNITY): Payer: Medicare HMO

## 2019-08-19 DIAGNOSIS — J9601 Acute respiratory failure with hypoxia: Secondary | ICD-10-CM | POA: Diagnosis not present

## 2019-08-19 DIAGNOSIS — J1289 Other viral pneumonia: Secondary | ICD-10-CM | POA: Diagnosis not present

## 2019-08-19 DIAGNOSIS — J9600 Acute respiratory failure, unspecified whether with hypoxia or hypercapnia: Secondary | ICD-10-CM | POA: Diagnosis not present

## 2019-08-19 DIAGNOSIS — E8770 Fluid overload, unspecified: Secondary | ICD-10-CM | POA: Diagnosis not present

## 2019-08-19 DIAGNOSIS — Z9689 Presence of other specified functional implants: Secondary | ICD-10-CM | POA: Diagnosis not present

## 2019-08-19 DIAGNOSIS — Z930 Tracheostomy status: Secondary | ICD-10-CM | POA: Diagnosis not present

## 2019-08-19 DIAGNOSIS — D649 Anemia, unspecified: Secondary | ICD-10-CM | POA: Diagnosis not present

## 2019-08-19 DIAGNOSIS — A4901 Methicillin susceptible Staphylococcus aureus infection, unspecified site: Secondary | ICD-10-CM | POA: Diagnosis not present

## 2019-08-19 DIAGNOSIS — N179 Acute kidney failure, unspecified: Secondary | ICD-10-CM | POA: Diagnosis not present

## 2019-08-19 DIAGNOSIS — J80 Acute respiratory distress syndrome: Secondary | ICD-10-CM | POA: Diagnosis not present

## 2019-08-19 DIAGNOSIS — U071 COVID-19: Secondary | ICD-10-CM | POA: Diagnosis not present

## 2019-08-19 DIAGNOSIS — R579 Shock, unspecified: Secondary | ICD-10-CM | POA: Diagnosis not present

## 2019-08-19 DIAGNOSIS — I959 Hypotension, unspecified: Secondary | ICD-10-CM | POA: Diagnosis not present

## 2019-08-19 DIAGNOSIS — E872 Acidosis: Secondary | ICD-10-CM | POA: Diagnosis not present

## 2019-08-19 LAB — POCT I-STAT, CHEM 8
BUN: 35 mg/dL — ABNORMAL HIGH (ref 8–23)
BUN: 43 mg/dL — ABNORMAL HIGH (ref 8–23)
BUN: 35 mg/dL — ABNORMAL HIGH (ref 8–23)
BUN: 45 mg/dL — ABNORMAL HIGH (ref 8–23)
Calcium, Ion: 0.36 mmol/L — CL (ref 1.15–1.40)
Calcium, Ion: 0.97 mmol/L — ABNORMAL LOW (ref 1.15–1.40)
Calcium, Ion: 0.36 mmol/L — CL (ref 1.15–1.40)
Calcium, Ion: 1.12 mmol/L — ABNORMAL LOW (ref 1.15–1.40)
Chloride: 90 mmol/L — ABNORMAL LOW (ref 98–111)
Chloride: 90 mmol/L — ABNORMAL LOW (ref 98–111)
Chloride: 88 mmol/L — ABNORMAL LOW (ref 98–111)
Chloride: 88 mmol/L — ABNORMAL LOW (ref 98–111)
Creatinine, Ser: 1.4 mg/dL — ABNORMAL HIGH (ref 0.61–1.24)
Creatinine, Ser: 1.9 mg/dL — ABNORMAL HIGH (ref 0.61–1.24)
Creatinine, Ser: 1.6 mg/dL — ABNORMAL HIGH (ref 0.61–1.24)
Creatinine, Ser: 2.1 mg/dL — ABNORMAL HIGH (ref 0.61–1.24)
Glucose, Bld: 157 mg/dL — ABNORMAL HIGH (ref 70–99)
Glucose, Bld: 212 mg/dL — ABNORMAL HIGH (ref 70–99)
Glucose, Bld: 156 mg/dL — ABNORMAL HIGH (ref 70–99)
Glucose, Bld: 206 mg/dL — ABNORMAL HIGH (ref 70–99)
HCT: 26 % — ABNORMAL LOW (ref 39.0–52.0)
HCT: 28 % — ABNORMAL LOW (ref 39.0–52.0)
HCT: 20 % — ABNORMAL LOW (ref 39.0–52.0)
HCT: 20 % — ABNORMAL LOW (ref 39.0–52.0)
Hemoglobin: 8.8 g/dL — ABNORMAL LOW (ref 13.0–17.0)
Hemoglobin: 9.5 g/dL — ABNORMAL LOW (ref 13.0–17.0)
Hemoglobin: 6.8 g/dL — CL (ref 13.0–17.0)
Hemoglobin: 6.8 g/dL — CL (ref 13.0–17.0)
Potassium: 3.5 mmol/L (ref 3.5–5.1)
Potassium: 3.7 mmol/L (ref 3.5–5.1)
Potassium: 3.4 mmol/L — ABNORMAL LOW (ref 3.5–5.1)
Potassium: 3.6 mmol/L (ref 3.5–5.1)
Sodium: 141 mmol/L (ref 135–145)
Sodium: 142 mmol/L (ref 135–145)
Sodium: 141 mmol/L (ref 135–145)
Sodium: 141 mmol/L (ref 135–145)
TCO2: 32 mmol/L (ref 22–32)
TCO2: 37 mmol/L — ABNORMAL HIGH (ref 22–32)
TCO2: 33 mmol/L — ABNORMAL HIGH (ref 22–32)
TCO2: 38 mmol/L — ABNORMAL HIGH (ref 22–32)

## 2019-08-19 LAB — CBC WITH DIFFERENTIAL/PLATELET
Abs Immature Granulocytes: 0.06 10*3/uL (ref 0.00–0.07)
Basophils Absolute: 0 10*3/uL (ref 0.0–0.1)
Basophils Relative: 0 %
Eosinophils Absolute: 0.1 10*3/uL (ref 0.0–0.5)
Eosinophils Relative: 2 %
HCT: 24.7 % — ABNORMAL LOW (ref 39.0–52.0)
Hemoglobin: 7.7 g/dL — ABNORMAL LOW (ref 13.0–17.0)
Immature Granulocytes: 1 %
Lymphocytes Relative: 5 %
Lymphs Abs: 0.5 10*3/uL — ABNORMAL LOW (ref 0.7–4.0)
MCH: 29.3 pg (ref 26.0–34.0)
MCHC: 31.2 g/dL (ref 30.0–36.0)
MCV: 93.9 fL (ref 80.0–100.0)
Monocytes Absolute: 0.3 10*3/uL (ref 0.1–1.0)
Monocytes Relative: 4 %
Neutro Abs: 8 10*3/uL — ABNORMAL HIGH (ref 1.7–7.7)
Neutrophils Relative %: 88 %
Platelets: 170 10*3/uL (ref 150–400)
RBC: 2.63 MIL/uL — ABNORMAL LOW (ref 4.22–5.81)
RDW: 16.3 % — ABNORMAL HIGH (ref 11.5–15.5)
WBC: 9.1 10*3/uL (ref 4.0–10.5)
nRBC: 0.2 % (ref 0.0–0.2)

## 2019-08-19 LAB — RENAL FUNCTION PANEL
Albumin: 2.1 g/dL — ABNORMAL LOW (ref 3.5–5.0)
Albumin: 1.8 g/dL — ABNORMAL LOW (ref 3.5–5.0)
Anion gap: 14 (ref 5–15)
Anion gap: 12 (ref 5–15)
BUN: 52 mg/dL — ABNORMAL HIGH (ref 8–23)
BUN: 49 mg/dL — ABNORMAL HIGH (ref 8–23)
CO2: 37 mmol/L — ABNORMAL HIGH (ref 22–32)
CO2: 38 mmol/L — ABNORMAL HIGH (ref 22–32)
Calcium: 8.4 mg/dL — ABNORMAL LOW (ref 8.9–10.3)
Calcium: 8.4 mg/dL — ABNORMAL LOW (ref 8.9–10.3)
Chloride: 91 mmol/L — ABNORMAL LOW (ref 98–111)
Chloride: 90 mmol/L — ABNORMAL LOW (ref 98–111)
Creatinine, Ser: 1.96 mg/dL — ABNORMAL HIGH (ref 0.61–1.24)
Creatinine, Ser: 2.01 mg/dL — ABNORMAL HIGH (ref 0.61–1.24)
GFR calc Af Amer: 40 mL/min — ABNORMAL LOW (ref >60)
GFR calc Af Amer: 39 mL/min — ABNORMAL LOW (ref >60)
GFR calc non Af Amer: 35 mL/min — ABNORMAL LOW (ref >60)
GFR calc non Af Amer: 34 mL/min — ABNORMAL LOW (ref >60)
Glucose, Bld: 175 mg/dL — ABNORMAL HIGH (ref 70–99)
Glucose, Bld: 183 mg/dL — ABNORMAL HIGH (ref 70–99)
Phosphorus: 2.6 mg/dL (ref 2.5–4.6)
Phosphorus: 2.6 mg/dL (ref 2.5–4.6)
Potassium: 3.4 mmol/L — ABNORMAL LOW (ref 3.5–5.1)
Potassium: 3.7 mmol/L (ref 3.5–5.1)
Sodium: 142 mmol/L (ref 135–145)
Sodium: 140 mmol/L (ref 135–145)

## 2019-08-19 LAB — GLUCOSE, CAPILLARY
Glucose-Capillary: 221 mg/dL — ABNORMAL HIGH (ref 70–99)
Glucose-Capillary: 145 mg/dL — ABNORMAL HIGH (ref 70–99)
Glucose-Capillary: 138 mg/dL — ABNORMAL HIGH (ref 70–99)
Glucose-Capillary: 173 mg/dL — ABNORMAL HIGH (ref 70–99)
Glucose-Capillary: 128 mg/dL — ABNORMAL HIGH (ref 70–99)

## 2019-08-19 MED ORDER — FUROSEMIDE 10 MG/ML IJ SOLN
40.0000 mg | Freq: Four times a day (QID) | INTRAMUSCULAR | Status: AC
  Administered 2019-08-19 – 2019-08-20 (×3): 40 mg via INTRAVENOUS
  Filled 2019-08-19 (×3): qty 4

## 2019-08-19 MED ORDER — STERILE WATER FOR INJECTION IJ SOLN
INTRAMUSCULAR | Status: AC
  Filled 2019-08-19: qty 10

## 2019-08-19 MED ORDER — METOLAZONE 10 MG PO TABS
10.0000 mg | ORAL_TABLET | Freq: Once | ORAL | Status: AC
  Administered 2019-08-19: 10 mg via ORAL
  Filled 2019-08-19: qty 1

## 2019-08-19 MED ORDER — HEPARIN SODIUM (PORCINE) 1000 UNIT/ML DIALYSIS
1000.0000 [IU] | INTRAMUSCULAR | Status: DC | PRN
  Administered 2019-08-19 – 2019-08-22 (×2): 2800 [IU] via INTRAVENOUS_CENTRAL
  Administered 2019-08-24: 3000 [IU] via INTRAVENOUS_CENTRAL
  Filled 2019-08-19: qty 3
  Filled 2019-08-19: qty 6
  Filled 2019-08-19 (×2): qty 3

## 2019-08-19 MED ORDER — ACETAZOLAMIDE SODIUM 500 MG IJ SOLR
250.0000 mg | Freq: Four times a day (QID) | INTRAMUSCULAR | Status: AC
  Administered 2019-08-19 – 2019-08-20 (×3): 250 mg via INTRAVENOUS
  Filled 2019-08-19 (×3): qty 500

## 2019-08-19 NOTE — Progress Notes (Signed)
ETT flushed with 10 mL normal saline.  A moderate amount of tan thick secretions with mucus plugs obtained. Patient tolerated well.

## 2019-08-19 NOTE — Progress Notes (Signed)
NAME:  [name], MRN:  [id number], DOB:  31-Jan-1951, LOS: 25 ADMISSION DATE:  07/25/2019, CONSULTATION DATE:  10/3 REFERRING MD:  Nadara Mustard, CHIEF COMPLAINT:  Dyspnea   Brief History   68 yo male dx with COVID 07/22/19 presented to [facility] ER 07/25/19 with progressive dyspnea and hypoxia requiring intubation from COVID 19 pneumonia.  Past Medical History  GERD  Significant Hospital Events   10/03 Admit to [facility] from [facility] ER, start decadron and remdesivir, given tociluzimab; prone positioning 10/04 convalescent plasma 10/05 stop prone positioning 10/06 convalescent plasma; prone positioning again 10/09 start ABx 10/10 MSSA bacteremia, CVL d/ced; ID consulted; increase OG tube outpt 10/11 vent weaning trial started 10/13 fever, pneumothorax, pig tail chest tube placed 10/14 worsening hypotension and renal fx >> consulted nephrology; worsening PTX >> replaced chest tube; GNR in sputum >> ABx changed 10/15 start CRRT; endobronchial blocker placed for persistent air leak 10/16 endobronchial blocker repositioned, changed chest tube to water seal; melana with ABLA >> GI consulted; transfuse PRBC 10/17 persistent air leak, increased WOB; start nimbex gtt >> air leak decreased; EGD; A fib with RVR >> start amiodarone 10/18 transfuse PRBC; GI s/o 10/20 resume heparin gtt 10/21 stopped nimbex, chest tube to suction, stopped heparin drip because Hgb dropped again 10/22 TPA in chest tube, repositioned endobronchial blocker 10/23 deflated endobronchial balloon 10/24 small air leak  10/25 removed bronchial blocker  Consults:  ID Nephrology Gastroenterology PCCM  Procedures:  10/2 ETT>  10/2 R IJ CVL > 10/10 10/13 Rt pig tail chest tube >> 10/14 10/14 R 46F chest tube >  10/14 R subclavian CVL >  10/14 L IJ HD cath >  10/14 Lt radial a line >   Significant Diagnostic Tests:  10/2 CT angiogram chest > extensive bilateral airspace disease predominantly posterior 10/8 doppler legs b/l >  no DVT 10/15 renal u/s > normal 10/17 EGD > non bleeding gastric ulcer  10/19 Echo >> EF 60 to 65%, hyperdynamic LV with small LVOT gradient 10/22 CT chest > large collection of air in pleural space with fluid, pneumonia bilaterally R>L endobronchial blocker in place, chest tube anterior  Micro Data:  9/20 SARS COV 2 > POSITIVE 10/2 blood > negative 10/9 blood > MSSA 2/4 10/9 resp > MSSA, Pseudomonas 10/13 blood >> negative 10/21 bronch wash bacterial > pseduomonas, acinetobacter baumannii 10/21 bronch wash fungal >  10/21 bronch wash aspergillus antigen> negative 10/24 bronchoscopy wash>   Antimicrobials:  10/3 remdesivir > 10/6 10/3 actemra 10/3 decadron > 10/12 10/4 convalescent plasma 10/6 convalescent plasma  10/9 vancomycin > 10/10 10/9 cefepime > 10/10 10/10 ancef > 10/13 10/13 cefepime > 10/20 10/14 anidulofungin > 10/15  10/20 ancef > 10/23 10/23 meropenem >   10/21 anidulofungin> 10/22 10/22 voriconazole > 10/23  Interim history/subjective:   Off pressors Leak persists Metabolic alkalosis overnight  Objective   Blood pressure 126/61, pulse 94, temperature (!) 97.2 F (36.2 C), temperature source Axillary, resp. rate (!) 25, height 5\' 10"  (1.778 m), weight 82.7 kg, SpO2 100 %. CVP:  [0 mmHg-3 mmHg] 1 mmHg  Vent Mode: PSV;CPAP FiO2 (%):  [40 %] 40 % Set Rate:  [15 bmp-24 bmp] 24 bmp Vt Set:  [550 mL] 550 mL PEEP:  [5 cmH20-8 cmH20] 5 cmH20 Pressure Support:  [12 cmH20] 12 cmH20 Plateau Pressure:  [18 cmH20] 18 cmH20   Intake/Output Summary (Last 24 hours) at 08/19/2019 0831 Last data filed at 08/19/2019 0800 Gross per 24 hour  Intake 4358.46 ml  Output 4205 ml  Net 153.46 ml   Filed Weights   08/17/19 0440 08/18/19 0500 08/19/19 0500  Weight: 83.7 kg 85.3 kg 82.7 kg    Examination:  General:  Acutely ill appearing male, NAD on vent, sedate HENT: [place]/AT, PERRL, EOM-I and MMM, ETT in place PULM: Coarse BS diffusely, air leak on CT on the  right CV: RRR, Nl S1/S2 and -M/R/G GI: Soft, NT, ND and +BS MSK: -edema and -tenderness Neuro: opens eyes to voice, doesn't move arms or legs  I reviewed CXR myself, ETT is in a good position, CT noted  Resolved Hospital Problem list   Atrial fibrillation Septic shock  Assessment & Plan:  ARDS due to COVID-19 pneumonia: improved oxygenation Ventilatory synchrony best with pressure control, not changed much with sedation Minimize sedation Decrease RR to 16 Diamox for metabolic acidosis Wean PEEP/FiO2 for SaO2 > 85% VAP prevention Tracheostomy this week at one point CXR and ABG in AM  R Bronchopleural fistula, persistent air leak 10/26, bronchial blocker was up for over a week, fistula didn't heal so removed blocker to reduce likelihood of repeated episodes of pneumonia Suspect that endobronchial valve would be associated with more pneumonia like endobronchial blocker Likely necrotic RLL, unclear if fistula will heal Empyema R Continue chest wall to suction as per advice from thoracic surgery Will eventually need consideration of VATS, but would wait to see how well he wakes up first before we consider that, CVTS is aware of patient Monitor ABG/CXR  Acute kidney injury: Metabolic alklalosis on ABG from 10/26 Discuss alkalosis with renal Continue CVVHD Monitor BMET and UOP Replace electrolytes as needed Lasix/zaroxolyn/diamox ordered  Need for sedation/mechanical ventilation RASS target 0 to -1 Neuromuscular weakness Intermittent tachypnea: have seen this with many COVID patients, doesn't seem to be affected by medications RASS target 0 to -1 Continue fentanyl patch, minimize intermittent sedation unless causing significantly high pressures on vent with dyssychrony  Gastric ulcer: PPI  Anemia, acute hemorrhagic: Hgb down again on 10/23, spurious? Subcutaneous heparin for DVT prophylaxis  MSSA bacteremia Acinetobacter and Pseudomonal pneumonia> RLL necrotic? Continue  meropenem for 7 days, re-assess at day 7 as may need more   Best practice:  Diet: tube feeding Pain/Anxiety/Delirium protocol (if indicated): as above VAP protocol (if indicated): yes DVT prophylaxis: prophylactic dosing heparin GI prophylaxis: pantoprazole Glucose control: SSI Mobility: bed rest Code Status: DNR Family Communication: I updated [name] today by phone Disposition: remain in ICU  Labs   CBC: Recent Labs  Lab 08/15/19 0500  08/15/19 1510  08/16/19 0430  08/17/19 0515  08/18/19 0355  08/18/19 1727 08/18/19 1732 08/19/19 0218 08/19/19 0225 08/19/19 0500  WBC 7.5  --  10.2  --  9.0  --  8.2  --  6.7  --   --   --   --   --  9.1  NEUTROABS 6.9  --   --   --  8.3*  --  7.7  --  6.1  --   --   --   --   --  8.0*  HGB 7.3*   < > 8.6*   < > 8.3*   < > 7.5*   < > 6.8*   < > 6.8* 7.1* 9.5* 8.8* 7.7*  HCT 23.1*   < > 26.9*   < > 26.6*   < > 23.8*   < > 21.4*   < > 20.0* 21.0* 28.0* 26.0* 24.7*  MCV 96.3  --  95.7  --  96.0  --  95.6  --  96.0  --   --   --   --   --  93.9  PLT 122*  --  149*  --  129*  --  127*  --  142*  --   --   --   --   --  170   < > = values in this interval not displayed.    Basic Metabolic Panel: Recent Labs  Lab 08/13/19 0500  08/14/19 0530  08/15/19 0500  08/17/19 0514  08/17/19 1525  08/18/19 0355  08/18/19 1640 08/18/19 1727 08/18/19 1732 08/19/19 0218 08/19/19 0225 08/19/19 0500  NA 135  134*   < > 136   < > 139   < > 141   < > 141   < > 143   < > 143 141 141 141 142 142  K 4.5  4.5   < > 4.6   < > 3.9   < > 3.9   < > 3.9   < > 3.8   < > 3.8 3.8 3.7 3.7 3.5 3.4*  CL 97*  96*   < > 95*   < > 92*   < > 93*   < > 92*   < > 92*   < > 92* 88* 88* 90* 90* 91*  CO2 27  27   < > 27   < > 34*   < > 34*  --  36*  --  37*  --  39*  --   --   --   --  37*  GLUCOSE 113*  112*   < > 237*   < > 147*   < > 181*   < > 193*   < > 164*   < > 168* 179* 222* 212* 157* 175*  BUN 44*  43*   < > 49*   < > 45*   < > 48*   < > 48*   < > 56*   < > 51*  44* 35* 35* 43* 52*  CREATININE 1.86*  1.79*   < > 2.39*   < > 2.19*   < > 2.10*   < > 2.08*   < > 2.02*   < > 2.04* 2.00* 1.40* 1.40* 1.90* 1.96*  CALCIUM 7.9*  7.9*   < > 7.6*   < > 7.7*   < > 8.2*  --  8.3*  --  8.3*  --  8.6*  --   --   --   --  8.4*  MG 2.4  --  2.3  --  2.1  --   --   --   --   --   --   --   --   --   --   --   --   --   PHOS 3.4   < > 5.6*   < > 3.3   < > 3.1  --  2.9  --  2.7  --  2.2*  --   --   --   --  2.6   < > = values in this interval not displayed.   GFR: Estimated Creatinine Clearance: 38.3 mL/min (A) (by C-G formula based on SCr of 1.96 mg/dL (H)). Recent Labs  Lab 08/16/19 0430 08/17/19 0515 08/18/19 0355 08/19/19 0500  WBC 9.0 8.2 6.7 9.1    Liver Function Tests: Recent Labs  Lab 08/13/19 0500  08/14/19 0530  08/15/19  0500  08/16/19 0430  08/17/19 0514 08/17/19 1525 08/18/19 0355 08/18/19 1640 08/19/19 0500  AST 24  --  25  --  29  --  38  --   --   --   --   --   --   ALT 9  --  8  --  6  --  8  --   --   --   --   --   --   ALKPHOS 52  --  61  --  59  --  71  --   --   --   --   --   --   BILITOT 0.5  --  0.8  --  0.3  --  0.6  --   --   --   --   --   --   PROT 6.2*  --  6.2*  --  6.0*  --  6.2*  --   --   --   --   --   --   ALBUMIN 2.8*  2.8*   < > 2.4*  2.4*   < > 2.2*  2.2*   < > 2.1*  2.2*   < > 2.0* 1.8* 1.8* 1.7* 2.1*   < > = values in this interval not displayed.   No results for input(s): LIPASE, AMYLASE in the last 168 hours. No results for input(s): AMMONIA in the last 168 hours.  ABG    Component Value Date/Time   PHART 7.546 (H) 08/18/2019 1242   PCO2ART 47.4 08/18/2019 1242   PO2ART 92.0 08/18/2019 1242   HCO3 41.0 (H) 08/18/2019 1242   TCO2 37 (H) 08/19/2019 0225   ACIDBASEDEF 2.0 08/14/2019 0515   O2SAT 98.0 08/18/2019 1242     Coagulation Profile: No results for input(s): INR, PROTIME in the last 168 hours.  Cardiac Enzymes: No results for input(s): CKTOTAL, CKMB, CKMBINDEX, TROPONINI in the last  168 hours.  HbA1C: Hgb A1c MFr Bld  Date/Time Value Ref Range Status  07/26/2019 05:00 AM 6.1 (H) 4.8 - 5.6 % Final    Comment:    (NOTE) Pre diabetes:          5.7%-6.4% Diabetes:              >6.4% Glycemic control for   <7.0% adults with diabetes     CBG: Recent Labs  Lab 08/18/19 1515 08/18/19 1958 08/18/19 2332 08/19/19 0328 08/19/19 0747  GLUCAP 156* 234* 212* 221* 145*   The patient is critically ill with multiple organ systems failure and requires high complexity decision making for assessment and support, frequent evaluation and titration of therapies, application of advanced monitoring technologies and extensive interpretation of multiple databases.   Critical Care Time devoted to patient care services described in this note is  34  Minutes. This time reflects time of care of this signee Dr Jennet Maduro. This critical care time does not reflect procedure time, or teaching time or supervisory time of PA/NP/Med student/Med Resident etc but could involve care discussion time.  Rush Farmer, M.D. [facility] Pulmonary/Critical Care Medicine.

## 2019-08-19 NOTE — Progress Notes (Signed)
(  no charge)  I note where patient's Helicobacter pylori antibody came back positive.  It is possible that this was contributory to patient's ulcer formation.   Because the patient is currently on aggressive anti-peptic therapy, there is no rush to treat this chronic infection, since he will likely heal his ulcer even in the face of the H. Pylori.  However, perhaps following discharge and once he is not on so many medications, he could undergo a standard 2-week regimen for treatment of the H. Pylori to help prevent ulcer recurrence.  (Since the H. Pylori treatment regimens require about 8 or more pills per day, it'd be nice to wait a while.)  We will plan to check in with the patient 2-3 months from now to ascertain his status, and arrange the above treatment and possibly a f/u egd to confirm ulcer healing, if appropriate in view of his medical condition at the time.  Please call me if questions.  Cleotis Nipper, M.D. Pager [phone] If no answer or after 5 PM call [phone]

## 2019-08-19 NOTE — Progress Notes (Signed)
Patient's ETT flushed with 10 mL normal saline.  Suctioned a small amount of tan/pink thick secretions without complications.

## 2019-08-19 NOTE — Progress Notes (Signed)
ETT flushed with 52mL normal saline per CCM Qshift .  Moderated thick tan secretions obtained.

## 2019-08-19 NOTE — Progress Notes (Addendum)
[facility] Progress Note    68 year old gentleman who was admitted with Covid pneumonia July 13, 2019 completed course of remdesivir convalescent plasma Decadron treated with vancomycin and cefepime from 08/01/2019 until 08/02/2019. On 08/02/2019 patient developed an ileus. His creatinine on 07/25/2019 was 1.5 which appears to be his baseline. On 08/02/2019 started to increase. On 08/02/2019 was found to have MSSA bacteremia and was started on Ancef. His blood pressure has been marginal with drops in blood pressure to 70 mmHg. His renal ultrasound showed no hydronephrosis on 08/07/2019. Pt had administration of intravenous contrast on 07/25/2019.  Levo gtt off at this time.  Assessment/ Plan:    Acute kidney injury: on CKD w/ baseline creat of 1.5. Admitted with Covid pneumonia complicated course with recent history of MSSA bacteremia. Developed shock hypotension and required pressors. No evidence of hydronephrosis on renal ultrasound. Suspected ATN.There was administration of contrast but this was early on the course of his illness on 07/25/2019.Formetabolic acidosis was started on CRRT 08/07/2019. No signs renal of recovery at this time. ? Getting citrate local a/c protocol ? Keeping even w/ CRRT -> no changes for now. Doesn't appear to be volume overloaded ? Alkalotic despite low bicarb dialysate, this is a known complication of long-term citrate exposure. Serum CO2 stable today at 35. Heparin is not ideal w/ recent GIB (10/17). Not good options for continued CRRT. Will dc CRRT for now and d/w pmd/ CCM in am about possibly transfer to [facility] for intermittent HD which can be done w/o anticoagulation.   Anemia / sp GIB - as per primary team transfuse as necessary  COVID-19 pneumonia- Treated with remdesivir, dexamethasone and convalescent plasma  Afib / RVR - new, IV amio  GIB - acute, sp EGD 10/17 w/ large gastric ulcer/ w visible vessel, Rx'd by  clipping  VDRF/ R PTX w/ chest tube  Shock - Off and on low pressors  [name], MD 08/19/2019, 4:29 PM   Subjective:   Off pressors and tolerating CRRT.   Objective:   BP 92/62   Pulse (!) 110   Temp 98.4 F (36.9 C)   Resp (!) 28   Ht 5\' 10"  (1.778 m)   Wt 82.7 kg   SpO2 98%   BMI 26.16 kg/m   Intake/Output Summary (Last 24 hours) at 08/19/2019 1629 Last data filed at 08/19/2019 1600 Gross per 24 hour  Intake 3907.94 ml  Output 4252 ml  Net -344.06 ml   Weight change: -2.6 kg  Physical Exam: Seen in ICU, on vent, sedated L IJ temp HD cath No jvd Chest cta bilat Cor reg no RG Abd soft obese Ext  no LE edema Neuro sedated   Imaging: Dg Chest Port 1 View  Result Date: 08/19/2019 CLINICAL DATA:  Pneumonia and COVID-19. EXAM: PORTABLE CHEST 1 VIEW COMPARISON:  August 18, 2019. FINDINGS: Stable cardiomediastinal silhouette. Endotracheal and nasogastric tubes are unchanged in position. Left internal jugular and right subclavian catheters are unchanged in position. Stable right-sided chest tube is noted with probable stable loculated right basilar hydropneumothorax. Stable opacities are noted throughout both lungs concerning for pneumonia. Bony thorax is unremarkable. IMPRESSION: Stable support apparatus. Stable right-sided chest tube with probable right basilar hydropneumothorax. Stable bilateral lung opacities. Electronically Signed   By: Marijo Conception M.D.   On: 08/19/2019 07:44   Dg Chest Port 1 View  Result Date: 08/18/2019 CLINICAL DATA:  Follow-up endotracheal tube EXAM: PORTABLE CHEST 1 VIEW COMPARISON:  08/17/2019 FINDINGS: Cardiac shadow is  stable. Right subclavian central line is noted in satisfactory position. Endotracheal tube and gastric catheter are again seen and stable. Left jugular dialysis catheter is noted. Patchy opacities are again seen in both lungs. Right chest tube is again noted with loculated hydropneumothorax stable  from the prior exam. IMPRESSION: Overall stable appearance of the chest. Electronically Signed   By: Inez Catalina M.D.   On: 08/18/2019 03:09    Labs: BMET Recent Labs  Lab 08/16/19 0430  08/16/19 1655  08/17/19 0514  08/17/19 1525  08/18/19 0355  08/18/19 1640 08/18/19 1727 08/18/19 1732 08/19/19 0218 08/19/19 0225 08/19/19 0500 08/19/19 1104 08/19/19 1108  NA 139   < > 139   < > 141   < > 141   < > 143   < > 143 141 141 141 142 142 141 141  K 4.1   < > 4.1   < > 3.9   < > 3.9   < > 3.8   < > 3.8 3.8 3.7 3.7 3.5 3.4* 3.4* 3.6  CL 93*  --  96*   < > 93*   < > 92*   < > 92*   < > 92* 88* 88* 90* 90* 91* 88* 88*  CO2 33*  --  32  --  34*  --  36*  --  37*  --  39*  --   --   --   --  37*  --   --   GLUCOSE 132*  --  203*   < > 181*   < > 193*   < > 164*   < > 168* 179* 222* 212* 157* 175* 206* 156*  BUN 36*  --  39*   < > 48*   < > 48*   < > 56*   < > 51* 44* 35* 35* 43* 52* 35* 45*  CREATININE 2.23*  --  2.09*   < > 2.10*   < > 2.08*   < > 2.02*   < > 2.04* 2.00* 1.40* 1.40* 1.90* 1.96* 1.60* 2.10*  CALCIUM 7.8*  --  7.9*  --  8.2*  --  8.3*  --  8.3*  --  8.6*  --   --   --   --  8.4*  --   --   PHOS 3.3  --  3.1  --  3.1  --  2.9  --  2.7  --  2.2*  --   --   --   --  2.6  --   --    < > = values in this interval not displayed.   CBC Recent Labs  Lab 08/16/19 0430  08/17/19 0515  08/18/19 0355  08/19/19 0225 08/19/19 0500 08/19/19 1104 08/19/19 1108  WBC 9.0  --  8.2  --  6.7  --   --  9.1  --   --   NEUTROABS 8.3*  --  7.7  --  6.1  --   --  8.0*  --   --   HGB 8.3*   < > 7.5*   < > 6.8*   < > 8.8* 7.7* 6.8* 6.8*  HCT 26.6*   < > 23.8*   < > 21.4*   < > 26.0* 24.7* 20.0* 20.0*  MCV 96.0  --  95.6  --  96.0  --   --  93.9  --   --   PLT 129*  --  127*  --  142*  --   --  170  --   --    < > = values in this interval not displayed.    Medications:    . acetaZOLAMIDE  250 mg Intravenous Q6H  . chlorhexidine  15 mL Mouth/Throat BID  . Chlorhexidine Gluconate Cloth   6 each Topical Daily  . doxazosin  2 mg Per Tube Daily  . feeding supplement (PRO-STAT SUGAR FREE 64)  60 mL Per Tube TID  . furosemide  40 mg Intravenous Q6H  . heparin injection (subcutaneous)  7,500 Units Subcutaneous Q8H  . insulin aspart  0-9 Units Subcutaneous Q4H  . mouth rinse  15 mL Mouth Rinse 10 times per day  . pantoprazole (PROTONIX) IV  40 mg Intravenous Q12H  . polyethylene glycol  17 g Per Tube QHS  . sennosides  5 mL Per Tube QHS  . sucralfate  1 g Oral Q6H

## 2019-08-19 NOTE — Progress Notes (Signed)
Called Thana Ates and provided full update on patient condition/answered all questions.

## 2019-08-19 NOTE — Progress Notes (Signed)
[name]  WGN:[id number] DOB: Sep 03, 1951 DOA: 07/25/2019 PCP: Kathyrn Drown, MD    Brief Narrative:  68 year old with a history of GERD and BPH who presented to [facility], ED with S OB and was found to be hypoxic.  He was initially diagnosed with COVID-19 September 20.  1 to 2 days prior to his presentation he developed worsening shortness of breath with anorexia.  His wife's physician during a teleconference call noted that the patient himself is very tachypneic and ultimately EMS was sent to the house.  EMS found the patient to have saturations in the 40s.  In the ED on nonrebreather the patient sats only improved to the 80s.  The patient was severely confused and required emergent intubation.  Chest x-ray confirmed multifocal infiltrates.  CT chest was negative for PE  Significant Events: 10/3 admit to [facility] from [place] ED 10/13 right pneumothorax -pigtail chest tube placed 10/14 Nephrology consulted 10/15 begin CRRT -endobronchial blocker placed 10/16 melena 10/17 A. fib with RVR -EGD 10/18 transfuse PRBC 10/20 Heparin drip resumed 10/21 abrupt drop in hemoglobin -transfuse 1 unit PRBC 10/23 tPA per chest tube  10/25 endobronchial blocker removed  10/26 1U PRBC  COVID-19 specific Treatment: Decadron 10/2 > 10/12 Remdesivir 10/2 > 10/6 Actemra 10/2 Convalescent plasma 10/3  Subjective: Sedated on ventilator.  Noncommunicative.  Assessment & Plan:  Covid pneumonia - acute hypoxic respiratory failure Has completed a course of decadron and remdesivir - was dosed with actemra and convalescent plasma - pulmonary status remains very complicated and tenuous - ventilator management per PCCM   Pseudomonas and Acinetobacter in BAL specimen Cont meropenem to complete at least 7 days of treatment and then reconsider  Acute renal failure CRRT dependent - Nephrology following  GI bleed - gastric ulcer - acute blood loss anemia Status post EGD with clipping of vessel in  large ulcer - full anticoagulation was resumed 10/20 following which he experienced an abrupt drop in his hemoglobin - he was transfused 1 unit 10/21 - anticoagulation stopped 10/21 - resumption of DVT prophylaxis 10/24 - suspect he has slow intermittent GI leaking - transfused 1U PRBC 10/26 - attempt to continue DVT prophy anticoag for now, but will delay plans to resume full anticoagulation for today  Markedly elevated D-dimer signif increase noted 10/19 - unable to fully anticoag at present due to concern for GIB - no evidence of DVT on dopplers 10/8 - likely related to suspected empyema right lung  Pneumothorax / bronchopleural fistula right - empyema CT chest 10/21 noted findings most consistent with an empyema as well as a persisting pneumothorax - bedside bronchoscopy revealed significant material in the airway -TPA infused via chest tube 10/23 - chest tube management per PCCM - endobronchial blocker removed 10/25 - suspect pt will eventually need VATS to address RLL   Acute transient atrial fibrillation with RVR No known prior history of atrial fibrillation -currently in sinus tachycardia-unable to fully anticoagulate due to possible recurrent GI bleeding -continue to follow on telemetry  MSSA bacteremia 2/2 blood cultures 10/9 - septic shock Completed a course of antibiotic for this indication -shock/hypotension resolved  HTN Not an active issue at present  BPH  DVT prophylaxis: Subcutaneous heparin Code Status: NO CODE - DNR Family Communication: per PCCM   Disposition Plan: ICU  Consultants:  PCCM Nephrology GI  Antimicrobials:  10/9 vancomycin > 10/10 10/9 cefepime > 10/10 10/10 ancef > 10/13 10/13 cefepime > 10/20 10/14 anidulofungin > 10/15 Cefazolin 10/20 > 10/23  Meropenem 10/23 >  Objective: Blood pressure 126/61, pulse 94, temperature (!) 97.2 F (36.2 C), temperature source Axillary, resp. rate (!) 25, height _0  (1.778 m), weight 82.7 kg, SpO2 100 %.   Intake/Output Summary (Last 24 hours) at 08/19/2019 0827 Last data filed at 08/19/2019 0800 Gross per 24 hour  Intake 4358.46 ml  Output 4205 ml  Net 153.46 ml   Filed Weights   08/17/19 0440 08/18/19 0500 08/19/19 0500  Weight: 83.7 kg 85.3 kg 82.7 kg    Examination: General: S sedated on ventilator Lungs: No air movement right base -air movement appreciable other fields with no wheezing Cardiovascular: Tachycardic -regular -no murmur Abdomen: Soft, BS+, no mass Extremities: trace edema B LE   CBC: Recent Labs  Lab 08/17/19 0515  08/18/19 0355  08/19/19 0218 08/19/19 0225 08/19/19 0500  WBC 8.2  --  6.7  --   --   --  9.1  NEUTROABS 7.7  --  6.1  --   --   --  8.0*  HGB 7.5*   < > 6.8*   < > 9.5* 8.8* 7.7*  HCT 23.8*   < > 21.4*   < > 28.0* 26.0* 24.7*  MCV 95.6  --  96.0  --   --   --  93.9  PLT 127*  --  142*  --   --   --  170   < > = values in this interval not displayed.   Basic Metabolic Panel: Recent Labs  Lab 08/13/19 0500  08/14/19 0530  08/15/19 0500  08/18/19 0355  08/18/19 1640  08/19/19 0218 08/19/19 0225 08/19/19 0500  NA 135  134*   < > 136   < > 139   < > 143   < > 143   < > 141 142 142  K 4.5  4.5   < > 4.6   < > 3.9   < > 3.8   < > 3.8   < > 3.7 3.5 3.4*  CL 97*  96*   < > 95*   < > 92*   < > 92*   < > 92*   < > 90* 90* 91*  CO2 27  27   < > 27   < > 34*   < > 37*  --  39*  --   --   --  37*  GLUCOSE 113*  112*   < > 237*   < > 147*   < > 164*   < > 168*   < > 212* 157* 175*  BUN 44*  43*   < > 49*   < > 45*   < > 56*   < > 51*   < > 35* 43* 52*  CREATININE 1.86*  1.79*   < > 2.39*   < > 2.19*   < > 2.02*   < > 2.04*   < > 1.40* 1.90* 1.96*  CALCIUM 7.9*  7.9*   < > 7.6*   < > 7.7*   < > 8.3*  --  8.6*  --   --   --  8.4*  MG 2.4  --  2.3  --  2.1  --   --   --   --   --   --   --   --   PHOS 3.4   < > 5.6*   < > 3.3   < > 2.7  --  2.2*  --   --   --  2.6   < > = values in this interval not displayed.   GFR: Estimated Creatinine  Clearance: 38.3 mL/min (A) (by C-G formula based on SCr of 1.96 mg/dL (H)).  Liver Function Tests: Recent Labs  Lab 08/13/19 0500  08/14/19 0530  08/15/19 0500  08/16/19 0430  08/17/19 1525 08/18/19 0355 08/18/19 1640 08/19/19 0500  AST 24  --  25  --  29  --  38  --   --   --   --   --   ALT 9  --  8  --  6  --  8  --   --   --   --   --   ALKPHOS 52  --  61  --  59  --  71  --   --   --   --   --   BILITOT 0.5  --  0.8  --  0.3  --  0.6  --   --   --   --   --   PROT 6.2*  --  6.2*  --  6.0*  --  6.2*  --   --   --   --   --   ALBUMIN 2.8*  2.8*   < > 2.4*  2.4*   < > 2.2*  2.2*   < > 2.1*  2.2*   < > 1.8* 1.8* 1.7* 2.1*   < > = values in this interval not displayed.    HbA1C: Hgb A1c MFr Bld  Date/Time Value Ref Range Status  07/26/2019 05:00 AM 6.1 (H) 4.8 - 5.6 % Final    Comment:    (NOTE) Pre diabetes:          5.7%-6.4% Diabetes:              >6.4% Glycemic control for   <7.0% adults with diabetes     CBG: Recent Labs  Lab 08/18/19 1515 08/18/19 1958 08/18/19 2332 08/19/19 0328 08/19/19 0747  GLUCAP 156* 234* 212* 221* 145*    Recent Results (from the past 240 hour(s))  Culture, respiratory (non-expectorated)     Status: None   Collection Time: 08/13/19 11:30 AM   Specimen: Bronchoalveolar Lavage; Respiratory  Result Value Ref Range Status   Specimen Description   Final    BRONCHIAL ALVEOLAR LAVAGE Performed at [facility], [address], [place], [place] [address]    Special Requests   Final    Normal Performed at [facility] [address], [place], [place] [address]    Gram Stain   Final    MODERATE WBC PRESENT,BOTH PMN AND MONONUCLEAR NO ORGANISMS SEEN Performed at [facility], [place] [address], [place], [place] [address]    Culture   Final    FEW PSEUDOMONAS AERUGINOSA FEW ACINETOBACTER CALCOACETICUS/BAUMANNII COMPLEX    Report Status 08/15/2019 FINAL  Final   Organism ID, Bacteria PSEUDOMONAS  AERUGINOSA  Final   Organism ID, Bacteria ACINETOBACTER CALCOACETICUS/BAUMANNII COMPLEX  Final      Susceptibility   Acinetobacter calcoaceticus/baumannii complex - MIC*    CEFTAZIDIME 8 SENSITIVE Sensitive     CEFTRIAXONE 16 INTERMEDIATE Intermediate     CIPROFLOXACIN 0.5 SENSITIVE Sensitive     GENTAMICIN 4 SENSITIVE Sensitive     IMIPENEM <=0.25 SENSITIVE Sensitive     PIP/TAZO 16 SENSITIVE Sensitive     TRIMETH/SULFA <=20 SENSITIVE Sensitive     CEFEPIME 16 INTERMEDIATE Intermediate     AMPICILLIN/SULBACTAM <=  2 SENSITIVE Sensitive     * FEW ACINETOBACTER CALCOACETICUS/BAUMANNII COMPLEX   Pseudomonas aeruginosa - MIC*    CEFTAZIDIME 4 SENSITIVE Sensitive     CIPROFLOXACIN <=0.25 SENSITIVE Sensitive     GENTAMICIN <=1 SENSITIVE Sensitive     IMIPENEM 1 SENSITIVE Sensitive     PIP/TAZO 8 SENSITIVE Sensitive     CEFEPIME <=1 SENSITIVE Sensitive     * FEW PSEUDOMONAS AERUGINOSA  Fungus Culture With Stain     Status: None   Collection Time: 08/13/19 11:30 AM  Result Value Ref Range Status   Fungus Stain Final report  Final    Comment: (NOTE) Performed At: [facility] [place], [place] [id number] Rush Farmer MD FU:[id number]    Fungus (Mycology) Culture PENDING  Incomplete   Fungal Source BRONCHIAL ALVEOLAR LAVAGE  Corrected    Comment: Performed at [facility], [place] [address], [place], [place] [address] CORRECTED ON 10/21 AT 1659: PREVIOUSLY REPORTED AS TRACHEAL ASPIRATE   Aspergillus Ag, BAL/Serum     Status: None   Collection Time: 08/13/19 11:30 AM  Result Value Ref Range Status   Aspergillus Ag, BAL/Serum 0.06 0.00 - 0.49 Index Final    Comment: (NOTE) Performed At: [facility] [address] [place], [place] [id number] Rush Farmer MD [id number] Performed At: [facility] [place], [place] [id number] Katina Degree MDPhD [id number]   Fungus Culture Result     Status: None   Collection Time: 08/13/19  11:30 AM  Result Value Ref Range Status   Result 1 Comment  Final    Comment: (NOTE) KOH/Calcofluor preparation:  no fungus observed. Performed At: [facility] [place], [place] [id number] Rush Farmer MD RC:[id number]   Expectorated sputum assessment w rflx to resp cult     Status: None   Collection Time: 08/16/19  9:25 AM   Specimen: Expectorated Sputum  Result Value Ref Range Status   Specimen Description EXPECTORATED SPUTUM  Final   Special Requests Normal  Final   Sputum evaluation   Final    THIS SPECIMEN IS ACCEPTABLE FOR SPUTUM CULTURE Performed at [facility], [place] [address], [place], [place] [address]    Report Status 08/16/2019 FINAL  Final  Culture, respiratory     Status: None   Collection Time: 08/16/19  9:25 AM  Result Value Ref Range Status   Specimen Description   Final    EXPECTORATED SPUTUM Performed at [facility] [address], [place], [place] [address]    Special Requests   Final    Normal Reflexed from [id number] Performed at [facility], [place] [address], [place], [place] [address]    Gram Stain   Final    ABUNDANT WBC PRESENT, PREDOMINANTLY PMN NO ORGANISMS SEEN Performed at [facility], [place] [address], [place], [place] [address]    Culture FEW PSEUDOMONAS AERUGINOSA  Final   Report Status 08/18/2019 FINAL  Final   Organism ID, Bacteria PSEUDOMONAS AERUGINOSA  Final      Susceptibility   Pseudomonas aeruginosa - MIC*    CEFTAZIDIME 4 SENSITIVE Sensitive     CIPROFLOXACIN <=0.25 SENSITIVE Sensitive     GENTAMICIN <=1 SENSITIVE Sensitive     IMIPENEM 1 SENSITIVE Sensitive     PIP/TAZO 8 SENSITIVE Sensitive     CEFEPIME 4 SENSITIVE Sensitive     * FEW PSEUDOMONAS AERUGINOSA     Scheduled Meds: . chlorhexidine  15 mL  Mouth/Throat BID  . Chlorhexidine Gluconate Cloth  6 each Topical Daily  . doxazosin  2 mg Per Tube Daily  . feeding supplement (PRO-STAT  SUGAR FREE 64)  60 mL Per Tube TID  . fentaNYL  1 patch Transdermal Q72H  . heparin injection (subcutaneous)  7,500 Units Subcutaneous Q8H  . insulin aspart  0-9 Units Subcutaneous Q4H  . mouth rinse  15 mL Mouth Rinse 10 times per day  . pantoprazole (PROTONIX) IV  40 mg Intravenous Q12H  . polyethylene glycol  17 g Per Tube QHS  . sennosides  5 mL Per Tube QHS  . sucralfate  1 g Oral Q6H   Continuous Infusions: .  prismasol BGK 4/2.5 200 mL/hr at 08/19/19 0017  . sodium chloride Stopped (08/16/19 0431)  . sodium chloride 10 mL/hr at 08/19/19 0800  . calcium gluconate infusion for CRRT 60 mL/hr at 08/19/19 0600  . dextrose Stopped (08/16/19 1623)  . feeding supplement (PIVOT 1.5 CAL) 50 mL/hr at 08/19/19 0500  . meropenem (MERREM) IV Stopped (08/19/19 0420)  . norepinephrine (LEVOPHED) Adult infusion Stopped (08/18/19 2025)  . prismasol B22GK 4/0 1,800 mL/hr at 08/19/19 0356  . sodium citrate 2 %/dextrose 2.5% solution 3000 mL 470 mL/hr at 08/19/19 0551     LOS: 25 days   Cherene Altes, MD [facility] Office  [phone] Pager - Text Page per Amion  If 7PM-7AM, please contact night-coverage per Amion 08/19/2019, 8:27 AM

## 2019-08-20 ENCOUNTER — Inpatient Hospital Stay (HOSPITAL_COMMUNITY): Payer: Medicare HMO

## 2019-08-20 DIAGNOSIS — J9311 Primary spontaneous pneumothorax: Secondary | ICD-10-CM

## 2019-08-20 DIAGNOSIS — J9601 Acute respiratory failure with hypoxia: Secondary | ICD-10-CM | POA: Diagnosis not present

## 2019-08-20 DIAGNOSIS — D62 Acute posthemorrhagic anemia: Secondary | ICD-10-CM | POA: Diagnosis not present

## 2019-08-20 DIAGNOSIS — Z9689 Presence of other specified functional implants: Secondary | ICD-10-CM

## 2019-08-20 DIAGNOSIS — R579 Shock, unspecified: Secondary | ICD-10-CM | POA: Diagnosis not present

## 2019-08-20 DIAGNOSIS — I959 Hypotension, unspecified: Secondary | ICD-10-CM | POA: Diagnosis not present

## 2019-08-20 DIAGNOSIS — K250 Acute gastric ulcer with hemorrhage: Secondary | ICD-10-CM | POA: Diagnosis not present

## 2019-08-20 DIAGNOSIS — J9600 Acute respiratory failure, unspecified whether with hypoxia or hypercapnia: Secondary | ICD-10-CM | POA: Diagnosis not present

## 2019-08-20 DIAGNOSIS — E872 Acidosis: Secondary | ICD-10-CM | POA: Diagnosis not present

## 2019-08-20 DIAGNOSIS — E8770 Fluid overload, unspecified: Secondary | ICD-10-CM | POA: Diagnosis not present

## 2019-08-20 DIAGNOSIS — R918 Other nonspecific abnormal finding of lung field: Secondary | ICD-10-CM | POA: Diagnosis not present

## 2019-08-20 DIAGNOSIS — J1289 Other viral pneumonia: Secondary | ICD-10-CM | POA: Diagnosis not present

## 2019-08-20 DIAGNOSIS — D649 Anemia, unspecified: Secondary | ICD-10-CM | POA: Diagnosis not present

## 2019-08-20 DIAGNOSIS — N179 Acute kidney failure, unspecified: Secondary | ICD-10-CM | POA: Diagnosis not present

## 2019-08-20 DIAGNOSIS — A4901 Methicillin susceptible Staphylococcus aureus infection, unspecified site: Secondary | ICD-10-CM | POA: Diagnosis not present

## 2019-08-20 DIAGNOSIS — R509 Fever, unspecified: Secondary | ICD-10-CM | POA: Diagnosis not present

## 2019-08-20 DIAGNOSIS — Z930 Tracheostomy status: Secondary | ICD-10-CM | POA: Diagnosis not present

## 2019-08-20 DIAGNOSIS — U071 COVID-19: Secondary | ICD-10-CM | POA: Diagnosis not present

## 2019-08-20 DIAGNOSIS — J80 Acute respiratory distress syndrome: Secondary | ICD-10-CM | POA: Diagnosis not present

## 2019-08-20 LAB — GLUCOSE, CAPILLARY
Glucose-Capillary: 146 mg/dL — ABNORMAL HIGH (ref 70–99)
Glucose-Capillary: 147 mg/dL — ABNORMAL HIGH (ref 70–99)
Glucose-Capillary: 148 mg/dL — ABNORMAL HIGH (ref 70–99)
Glucose-Capillary: 153 mg/dL — ABNORMAL HIGH (ref 70–99)
Glucose-Capillary: 158 mg/dL — ABNORMAL HIGH (ref 70–99)

## 2019-08-20 LAB — CBC WITH DIFFERENTIAL/PLATELET
Abs Immature Granulocytes: 0.1 10*3/uL — ABNORMAL HIGH (ref 0.00–0.07)
Basophils Absolute: 0 10*3/uL (ref 0.0–0.1)
Basophils Relative: 0 %
Eosinophils Absolute: 0.2 10*3/uL (ref 0.0–0.5)
Eosinophils Relative: 2 %
HCT: 21.6 % — ABNORMAL LOW (ref 39.0–52.0)
Hemoglobin: 6.7 g/dL — CL (ref 13.0–17.0)
Immature Granulocytes: 1 %
Lymphocytes Relative: 9 %
Lymphs Abs: 0.8 10*3/uL (ref 0.7–4.0)
MCH: 30.2 pg (ref 26.0–34.0)
MCHC: 31 g/dL (ref 30.0–36.0)
MCV: 97.3 fL (ref 80.0–100.0)
Monocytes Absolute: 0.4 10*3/uL (ref 0.1–1.0)
Monocytes Relative: 5 %
Neutro Abs: 7.2 10*3/uL (ref 1.7–7.7)
Neutrophils Relative %: 83 %
Platelets: 187 10*3/uL (ref 150–400)
RBC: 2.22 MIL/uL — ABNORMAL LOW (ref 4.22–5.81)
RDW: 16.5 % — ABNORMAL HIGH (ref 11.5–15.5)
WBC: 8.7 10*3/uL (ref 4.0–10.5)
nRBC: 1.7 % — ABNORMAL HIGH (ref 0.0–0.2)

## 2019-08-20 LAB — RENAL FUNCTION PANEL
Albumin: 1.9 g/dL — ABNORMAL LOW (ref 3.5–5.0)
Anion gap: 13 (ref 5–15)
BUN: 115 mg/dL — ABNORMAL HIGH (ref 8–23)
CO2: 34 mmol/L — ABNORMAL HIGH (ref 22–32)
Calcium: 8.3 mg/dL — ABNORMAL LOW (ref 8.9–10.3)
Chloride: 95 mmol/L — ABNORMAL LOW (ref 98–111)
Creatinine, Ser: 4.87 mg/dL — ABNORMAL HIGH (ref 0.61–1.24)
GFR calc Af Amer: 13 mL/min — ABNORMAL LOW (ref >60)
GFR calc non Af Amer: 12 mL/min — ABNORMAL LOW (ref >60)
Glucose, Bld: 142 mg/dL — ABNORMAL HIGH (ref 70–99)
Phosphorus: 6.8 mg/dL — ABNORMAL HIGH (ref 2.5–4.6)
Potassium: 4.6 mmol/L (ref 3.5–5.1)
Sodium: 142 mmol/L (ref 135–145)

## 2019-08-20 LAB — POCT I-STAT 7, (LYTES, BLD GAS, ICA,H+H)
Acid-Base Excess: 15 mmol/L — ABNORMAL HIGH (ref 0.0–2.0)
Bicarbonate: 40.4 mmol/L — ABNORMAL HIGH (ref 20.0–28.0)
Calcium, Ion: 1.15 mmol/L (ref 1.15–1.40)
HCT: 23 % — ABNORMAL LOW (ref 39.0–52.0)
Hemoglobin: 7.8 g/dL — ABNORMAL LOW (ref 13.0–17.0)
O2 Saturation: 98 %
Patient temperature: 38.3
Potassium: 4.2 mmol/L (ref 3.5–5.1)
Sodium: 140 mmol/L (ref 135–145)
TCO2: 42 mmol/L — ABNORMAL HIGH (ref 22–32)
pCO2 arterial: 59.7 mmHg — ABNORMAL HIGH (ref 32.0–48.0)
pH, Arterial: 7.444 (ref 7.350–7.450)
pO2, Arterial: 106 mmHg (ref 83.0–108.0)

## 2019-08-20 LAB — IRON AND TIBC
Iron: 71 ug/dL (ref 45–182)
Saturation Ratios: 33 % (ref 17.9–39.5)
TIBC: 217 ug/dL — ABNORMAL LOW (ref 250–450)
UIBC: 146 ug/dL

## 2019-08-20 LAB — CALCIUM, IONIZED
Calcium, Ionized, Serum: 4.1 mg/dL — ABNORMAL LOW (ref 4.5–5.6)
Calcium, Ionized, Serum: 4.1 mg/dL — ABNORMAL LOW (ref 4.5–5.6)

## 2019-08-20 LAB — PREPARE RBC (CROSSMATCH)

## 2019-08-20 LAB — PHOSPHORUS: Phosphorus: 4.9 mg/dL — ABNORMAL HIGH (ref 2.5–4.6)

## 2019-08-20 MED ORDER — PRISMASOL BGK 4/2.5 32-4-2.5 MEQ/L IV SOLN
INTRAVENOUS | Status: DC
  Administered 2019-08-20 – 2019-08-24 (×22): via INTRAVENOUS_CENTRAL

## 2019-08-20 MED ORDER — SODIUM CHLORIDE 0.9 % IV SOLN: 500.0000 mg | INTRAVENOUS | Status: DC

## 2019-08-20 MED ORDER — PRISMASOL BGK 4/2.5 32-4-2.5 MEQ/L REPLACEMENT SOLN
Status: DC
  Administered 2019-08-20 – 2019-08-24 (×12): via INTRAVENOUS_CENTRAL

## 2019-08-20 MED ORDER — SODIUM CHLORIDE 0.9 % IV SOLN
500.0000 [IU]/h | INTRAVENOUS | Status: DC
  Administered 2019-08-20 – 2019-08-23 (×4): 500 [IU]/h via INTRAVENOUS_CENTRAL
  Filled 2019-08-20 (×2): qty 10000
  Filled 2019-08-20: qty 2
  Filled 2019-08-20 (×2): qty 10000

## 2019-08-20 MED ORDER — DARBEPOETIN ALFA 100 MCG/0.5ML IJ SOSY
100.0000 ug | PREFILLED_SYRINGE | INTRAMUSCULAR | Status: DC
  Administered 2019-08-20 – 2019-09-03 (×3): 100 ug via SUBCUTANEOUS
  Filled 2019-08-20 (×3): qty 0.5

## 2019-08-20 MED ORDER — VANCOMYCIN VARIABLE DOSE PER UNSTABLE RENAL FUNCTION (PHARMACIST DOSING): Status: DC

## 2019-08-20 MED ORDER — VANCOMYCIN HCL 10 G IV SOLR
1250.0000 mg | Freq: Once | INTRAVENOUS | Status: DC
  Filled 2019-08-20 (×2): qty 1250

## 2019-08-20 MED ORDER — SODIUM CHLORIDE 0.9% IV SOLUTION
Freq: Once | INTRAVENOUS | Status: AC
  Administered 2019-08-20: 09:00:00 via INTRAVENOUS

## 2019-08-20 MED ORDER — SODIUM CHLORIDE 0.9 % IV SOLN
250.0000 [IU]/h | INTRAVENOUS | Status: DC
  Filled 2019-08-20: qty 2

## 2019-08-20 MED ORDER — VITAL 1.5 CAL PO LIQD
1000.0000 mL | ORAL | Status: DC
  Administered 2019-08-20 – 2019-08-25 (×7): 1000 mL
  Filled 2019-08-20 (×14): qty 1000

## 2019-08-20 MED ORDER — ALTEPLASE 2 MG IJ SOLR
2.0000 mg | Freq: Once | INTRAMUSCULAR | Status: DC | PRN
  Filled 2019-08-20: qty 2

## 2019-08-20 MED ORDER — PRISMASOL BGK 4/2.5 32-4-2.5 MEQ/L REPLACEMENT SOLN
Status: DC
  Administered 2019-08-20 – 2019-08-22 (×2): via INTRAVENOUS_CENTRAL

## 2019-08-20 MED ORDER — HEPARIN SODIUM (PORCINE) 1000 UNIT/ML DIALYSIS: 1000.0000 [IU] | INTRAMUSCULAR | Status: DC | PRN

## 2019-08-20 MED ORDER — SODIUM CHLORIDE 0.9 % FOR CRRT
INTRAVENOUS_CENTRAL | Status: DC | PRN
  Filled 2019-08-20: qty 1000

## 2019-08-20 MED ORDER — VANCOMYCIN HCL IN DEXTROSE 750-5 MG/150ML-% IV SOLN: 750.0000 mg | INTRAVENOUS | Status: DC

## 2019-08-20 MED ORDER — VANCOMYCIN HCL 10 G IV SOLR
1750.0000 mg | Freq: Once | INTRAVENOUS | Status: AC
  Administered 2019-08-20: 1750 mg via INTRAVENOUS
  Filled 2019-08-20: qty 1750

## 2019-08-20 MED ORDER — HEPARIN BOLUS VIA INFUSION (CRRT)
1000.0000 [IU] | INTRAVENOUS | Status: DC | PRN
  Filled 2019-08-20: qty 1000

## 2019-08-20 MED ORDER — SODIUM CHLORIDE 0.9 % IV SOLN
1.0000 g | Freq: Two times a day (BID) | INTRAVENOUS | Status: DC
  Administered 2019-08-21 – 2019-08-23 (×5): 1 g via INTRAVENOUS
  Filled 2019-08-20 (×5): qty 1

## 2019-08-20 NOTE — Progress Notes (Addendum)
Pharmacy Antibiotic Note  [name] is a 68 y.o. male admitted on 07/25/2019 with acute respiratory failure requiring intubation from COVID-19 PNA. Patient is currently on meropenem for MSSA bacteremia and Acinetobacter/Pseudomonal PNA. Pharmacy has been consulted for vancomycin dosing for fever. Patient with AKI on CVVHD that is currently stopped with possible plan to transfer to [facility] for HD.   Plan: Vancomycin 1750 mg iv once. Will continue to follow plans for HD vs resume CRRT and adjust dosing accordingly.   Adjust meropenem dosing to 500 mg iv q 24 hours.   Height: _0  (177.8 cm) Weight: 182 lb 5.1 oz (82.7 kg) IBW/kg (Calculated) : 73  Temp (24hrs), Avg:100.3 F (37.9 C), Min:87.4 F (30.8 C), Max:101.7 F (38.7 C)  Recent Labs  Lab 08/16/19 0430  08/17/19 0515  08/18/19 0355  08/19/19 0225 08/19/19 0500 08/19/19 1104 08/19/19 1108 08/19/19 1600 08/20/19 0400  WBC 9.0  --  8.2  --  6.7  --   --  9.1  --   --   --  8.7  CREATININE 2.23*   < >  --    < > 2.02*   < > 1.90* 1.96* 1.60* 2.10* 2.01*  --    < > = values in this interval not displayed.    Estimated Creatinine Clearance: 37.3 mL/min (A) (by C-G formula based on SCr of 2.01 mg/dL (H)).    No Known Allergies  10/3 Actemra x 1  10/3 remdesivir >> 10/6 10/3 cPlasma 10/7 cPlasma not given 10/9 Vancomycin >> 10/10 10/9 Cefepime >> 10/10; 10/14 >> 10/20 10/10 Cefazolin >>10/14; 10/21 >> 10/23 10/14 anidulafungin >> 10/16; 10/21 x1 10/22 Voriconazole >>10/23 10/23 Meropenem >>  10/28 vancomycin >>  10/2 BCx: no growth final  10/3 MRSA PCR: negative 10/9 UCx:<10k insig growth 10/9 BCx:4/4 bottles SSA BCID = MSSA 10/9tracheal aspirate: MSSA (pansens), rare pseudomonas (pansens) 10/9 MRSA [id number] 10/13 Bcx: negF  10/21 BAL:  few pseudomonas, few acinetobacter.  Aspergillus Ag: neg 10/24 Sputum: few pseudomonas (likely colonizer at this point)  10/28 BCx: 10/28 SCx:   Thank you for  allowing pharmacy to be a part of this patient's care.  [name] 08/20/2019 12:25 PM

## 2019-08-20 NOTE — Procedures (Signed)
Cortrak  Person Inserting Tube:  Maylon Peppers C, RD Tube Type:  Cortrak - 43 inches Tube Location:  Left nare Initial Placement:  Stomach Secured by: Bridle Technique Used to Measure Tube Placement:  Documented cm marking at nare/ corner of mouth Cortrak Secured At:  77 cm   Cortrak Tube Team Note:  Consult received to place a Cortrak feeding tube.   No x-ray is required. RN may begin using tube.   If the tube becomes dislodged please keep the tube and contact the Cortrak team at [email or web address] (password [email or web address]) for replacement.  If after hours and replacement cannot be delayed, place a NG tube and confirm placement with an abdominal x-ray.    [place], [place], [place] Pager [phone] After Hours Pager

## 2019-08-20 NOTE — Progress Notes (Signed)
PROGRESS NOTE  [name] ZOX:[id number] DOB: 07/11/1951 DOA: 07/25/2019  PCP: Kathyrn Drown, MD  Brief History/Interval Summary: 68 year old with a history of GERD and BPH who presented to [facility], ED with S OB and was found to be hypoxic.  He was initially diagnosed with COVID-19 September 20.  1 to 2 days prior to his presentation he developed worsening shortness of breath with anorexia.  His wife's physician during a teleconference call noted that the patient himself is very tachypneic and ultimately EMS was sent to the house.  EMS found the patient to have saturations in the 40s.  In the ED on nonrebreather the patient sats only improved to the 80s.  The patient was severely confused and required emergent intubation.  Chest x-ray confirmed multifocal infiltrates.  CT chest was negative for PE.  Reason for Visit: Acute respiratory failure with hypoxia.  Pneumonia due to COVID-19.  Significant Events: 10/3 admit to [facility] from [place] ED 10/13 right pneumothorax -pigtail chest tube placed 10/14 Nephrology consulted 10/15 begin CRRT -endobronchial blocker placed 10/16 melena 10/17 A. fib with RVR -EGD 10/18 transfuse PRBC 10/20 Heparin drip resumed 10/21 abrupt drop in hemoglobin -transfuse 1 unit PRBC 10/23 tPA per chest tube  10/25 endobronchial blocker removed  10/26 1U PRBC 10/28: 2 units of PRBC ordered  Consultants: Pulmonology.  Nephrology.  Gastroenterology.  Procedures:  EGD 10/17:   Impression:               - No active bleeding or blood or coffee ground                            material in the upper tract at the time of this                            exam.                           - Normal esophagus.                           - Excessive gastric fluid (residual tube feeding)                            without anatomic gastric outlet obstruction. Fluid                            aspiration performed. The stomach was somewhat   dilated and patulous, causing looping of the                            endoscope along the greater curve during                            advancement, making it difficult to traverse the                            pylorus, although this was eventually successfully                            accomplished.                           -  Non-bleeding gastric ulcer with pigmented                            material suggesting stigma of recent hemorrhage.                            Injected with good blanching. Clips were placed,                            effectively sealing off the ulcer.                           - Normal examined duodenum.   Antibiotics: Anti-infectives (From admission, onward)   Start     Dose/Rate Route Frequency Ordered Stop   08/21/19 1400  vancomycin (VANCOCIN) IVPB 750 mg/150 ml premix     750 mg 150 mL/hr over 60 Minutes Intravenous Every 24 hours 08/20/19 1146     08/20/19 1300  vancomycin (VANCOCIN) 1,250 mg in sodium chloride 0.9 % 250 mL IVPB     1,250 mg 166.7 mL/hr over 90 Minutes Intravenous  Once 08/20/19 1146     08/15/19 1600  meropenem (MERREM) 1 g in sodium chloride 0.9 % 100 mL IVPB     1 g 200 mL/hr over 30 Minutes Intravenous Every 12 hours 08/15/19 1543     08/15/19 1000  voriconazole (VFEND) tablet 200 mg  Status:  Discontinued     200 mg Per Tube Every 12 hours 08/14/19 1148 08/15/19 1134   08/14/19 1600  anidulafungin (ERAXIS) 100 mg in sodium chloride 0.9 % 100 mL IVPB  Status:  Discontinued     100 mg 78 mL/hr over 100 Minutes Intravenous Every 24 hours 08/13/19 1645 08/14/19 1148   08/14/19 1300  voriconazole (VFEND) tablet 400 mg     400 mg Per Tube Every 12 hours 08/14/19 1148 08/14/19 2217   08/13/19 1700  anidulafungin (ERAXIS) 200 mg in sodium chloride 0.9 % 200 mL IVPB     200 mg 78 mL/hr over 200 Minutes Intravenous  Once 08/13/19 1645 08/13/19 2030   08/13/19 0200  ceFAZolin (ANCEF) IVPB 2g/100 mL premix  Status:  Discontinued      2 g 200 mL/hr over 30 Minutes Intravenous Every 12 hours 08/11/19 1512 08/15/19 1543   08/12/19 0200  ceFEPIme (MAXIPIME) 2 g in sodium chloride 0.9 % 100 mL IVPB     2 g 200 mL/hr over 30 Minutes Intravenous Every 12 hours 08/11/19 1509 08/12/19 1341   08/07/19 2200  ceFEPIme (MAXIPIME) 2 g in sodium chloride 0.9 % 100 mL IVPB  Status:  Discontinued     2 g 200 mL/hr over 30 Minutes Intravenous Every 12 hours 08/07/19 1540 08/11/19 1509   08/07/19 1000  anidulafungin (ERAXIS) 100 mg in sodium chloride 0.9 % 100 mL IVPB  Status:  Discontinued     100 mg 78 mL/hr over 100 Minutes Intravenous Every 24 hours 08/06/19 0934 08/08/19 1203   08/07/19 0800  ceFEPIme (MAXIPIME) 2 g in sodium chloride 0.9 % 100 mL IVPB  Status:  Discontinued     2 g 200 mL/hr over 30 Minutes Intravenous Every 24 hours 08/06/19 1036 08/07/19 1540   08/06/19 1000  anidulafungin (ERAXIS) 200 mg in sodium chloride 0.9 % 200 mL IVPB    Note to Pharmacy: please  200 mg 78 mL/hr over 200 Minutes Intravenous Every 24 hours 08/06/19 0925 08/06/19 1600   08/06/19 0800  ceFEPIme (MAXIPIME) 2 g in sodium chloride 0.9 % 100 mL IVPB  Status:  Discontinued     2 g 200 mL/hr over 30 Minutes Intravenous Every 12 hours 08/06/19 0733 08/06/19 1036   08/02/19 2200  ceFAZolin (ANCEF) IVPB 2g/100 mL premix  Status:  Discontinued     2 g 200 mL/hr over 30 Minutes Intravenous Every 8 hours 08/02/19 1338 08/06/19 0733   08/02/19 1400  vancomycin (VANCOCIN) 1,250 mg in sodium chloride 0.9 % 250 mL IVPB  Status:  Discontinued     1,250 mg 166.7 mL/hr over 90 Minutes Intravenous Every 24 hours 08/01/19 1320 08/02/19 1332   08/01/19 1330  vancomycin (VANCOCIN) 2,000 mg in sodium chloride 0.9 % 500 mL IVPB     2,000 mg 250 mL/hr over 120 Minutes Intravenous  Once 08/01/19 1158 08/01/19 1636   08/01/19 1300  ceFEPIme (MAXIPIME) 2 g in sodium chloride 0.9 % 100 mL IVPB  Status:  Discontinued     2 g 200 mL/hr over 30 Minutes Intravenous  Every 12 hours 08/01/19 1158 08/02/19 1332   07/26/19 1600  remdesivir 100 mg in sodium chloride 0.9 % 250 mL IVPB     100 mg 500 mL/hr over 30 Minutes Intravenous Every 24 hours 07/26/19 0132 07/29/19 1823   07/26/19 0215  remdesivir 200 mg in sodium chloride 0.9 % 250 mL IVPB     200 mg 500 mL/hr over 30 Minutes Intravenous Once 07/26/19 0132 07/26/19 0520      Subjective/Interval History: Patient intubated and sedated.  Noted to be febrile.    Assessment/Plan:  Acute Hypoxic Resp. Failure/Pneumonia due to COVID-19  Vent Mode: PSV;CPAP FiO2 (%):  [40 %] 40 % Set Rate:  [16 bmp] 16 bmp Vt Set:  [550 mL] 550 mL PEEP:  [5 cmH20] 5 cmH20 Pressure Support:  [5 cmH20-8 cmH20] 5 cmH20 Plateau Pressure:  [12 cmH20] 12 cmH20     Component Value Date/Time   PHART 7.444 08/20/2019 0514   PCO2ART 59.7 (H) 08/20/2019 0514   PO2ART 106.0 08/20/2019 0514   HCO3 40.4 (H) 08/20/2019 0514   TCO2 42 (H) 08/20/2019 0514   ACIDBASEDEF 2.0 08/14/2019 0515   O2SAT 98.0 08/20/2019 0514    Lab Results  Component Value Date   SARSCOV2NAA Detected (A) 07/22/2019   COVID-19 specific Treatment: Decadron 10/2 > 10/12 Remdesivir 10/2 > 10/6 Actemra 10/2 Convalescent plasma 10/3  From a COVID-19 standpoint patient has completed treatments with steroids, remdesivir, Actemra and convalescent plasma.  Patient still requiring mechanical ventilation.  Pulmonology is following and managing.  Patient also has other active pulmonary issues as discussed below including empyema and pneumothorax.  He is on Levophed as well.  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  Bedside bronchoscopy revealed material in the airway.  TPA infused via chest tube on 10/23.  Chest tube management per pulmonology.  Endobronchial blocker removed on 10/25.  Pulmonology has discussed this issue with  cardiothoracic surgery as well.  Fever Patient noted to be febrile.  WBC noted to be normal.  Patient is on meropenem.  Reason for fever is not entirely clear.  Patient has previous history of bacteremia.  Repeat blood cultures.  Add vancomycin for now.    Pseudomonas and Acinetobacter in BAL specimen Patient on meropenem.  To complete at least 7 days of treatment and then reconsider.  Meropenem was started on 10/23.  Acute renal failure Patient on CRRT.  Nephrology is following and managing.  Based on their notes it appears that they want to transition him to intermittent hemodialysis as there has been a problem doing CRRT without using heparin.  Heparin unable to be used due to GI bleed.  Will need to discuss with the PCCM and nephrology as to plans for transferring patient to [facility].  Acute GI bleed secondary to gastric ulcer Patient seen by gastroenterology.  Underwent EGD with clipping of vessel and large ulcer.  Anticoagulation was initially held and resumed on 10/20.  However there was an abrupt drop in hemoglobin subsequently.  There was a suspicion that he was having experiencing slow intermittent GI bleed.  He was transfused 1 unit of PRBC on 10/26.  Continue PPI.  Twice a day.  H. pylori antibody was noted to be elevated.  Patient will need 2 weeks treatment for H. pylori when he is ready for discharge.  Plus he will need follow-up with [facility] gastroenterology.  Acute blood loss anemia Patient has been transfused with 3 units of PRBC during this hospitalization.  Last transfusion was on 10/26.  Hemoglobin noted to be low today.  2 units of PRBCs to be transfused today.  Markedly elevated D-dimer Noted to be significantly elevated on 10/19 greater than 20.  Unable to fully anticoagulate due to GI bleed.  No evidence for DVT on Dopplers on 10/8.  Thought to be related to suspected empyema in the right lung.  D-dimer improved to 8.83 on 10/23.  Atrial fibrillation  with RVR Patient without any prior history of atrial fibrillation.  Atrial fibrillation likely occurred in the setting of acute illness.  Currently sinus tachycardia.  Unable to fully anticoagulate due to GI bleeding.  MSSA bacteremia/septic shock 2 out of 2 blood cultures positive on 10/9.  Patient completed course of antibiotics for this indication.  Shock resolved.  History of BPH Monitor for urinary retention  FEN On CRRT.  Monitor electrolytes.  On tube feedings.  Pressure injuries Pressure Injury 08/16/19 Lip Medial;Lower Stage I -  Intact skin with non-blanchable redness of a localized area usually over a bony prominence. healing  (Active)  08/16/19 2000  Location: Lip  Location Orientation: Medial;Lower  Staging: Stage I -  Intact skin with non-blanchable redness of a localized area usually over a bony prominence.  Wound Description (Comments): healing   Present on Admission: No     Pressure Injury 08/16/19 Lip Lower;Mid Stage I -  Intact skin with non-blanchable redness of a localized area usually over a bony prominence. healing  (Active)  08/16/19 2342  Location: Lip  Location Orientation: Lower;Mid  Staging: Stage I -  Intact skin with non-blanchable redness of a localized area usually over a bony prominence.  Wound Description (Comments): healing   Present on Admission:     DVT Prophylaxis: Subcutaneous heparin Code Status: DNR Family Communication: We will discuss with family members Disposition Plan: Remain in ICU.  Prognosis is guarded.   Medications:  Scheduled: . sodium chloride   Intravenous Once  . chlorhexidine  15 mL Mouth/Throat BID  . Chlorhexidine Gluconate Cloth  6 each Topical Daily  . doxazosin  2 mg Per Tube Daily  . feeding supplement (PRO-STAT SUGAR FREE 64)  60 mL Per Tube TID  . heparin injection (subcutaneous)  7,500 Units Subcutaneous Q8H  . insulin  aspart  0-9 Units Subcutaneous Q4H  . mouth rinse  15 mL Mouth Rinse 10 times per day  .  pantoprazole (PROTONIX) IV  40 mg Intravenous Q12H  . polyethylene glycol  17 g Per Tube QHS  . sennosides  5 mL Per Tube QHS  . sucralfate  1 g Oral Q6H   Continuous: . sodium chloride Stopped (08/16/19 0431)  . sodium chloride 10 mL/hr at 08/19/19 2300  . dextrose Stopped (08/16/19 1623)  . feeding supplement (VITAL 1.5 CAL)    . meropenem (MERREM) IV 1 g (08/20/19 0413)  . norepinephrine (LEVOPHED) Adult infusion 8 mcg/min (08/20/19 0742)  . vancomycin    . [START ON 08/21/2019] vancomycin     VWU:JWJXB/JYNWGNFA arterial line **AND** sodium chloride, sodium chloride, acetaminophen (TYLENOL) oral liquid 160 mg/5 mL, dextrose, fentaNYL (SUBLIMAZE) injection, heparin, metoprolol tartrate, midazolam, pneumococcal 23 valent vaccine   Objective:  Vital Signs  Vitals:   08/20/19 1045 08/20/19 1100 08/20/19 1115 08/20/19 1130  BP: (!) _0 96/66  Pulse: (!) 109 (!) 110 (!) 108 (!) 105  Resp: (!) 31 (!) 30 (!) 29 (!) 28  Temp: (!) 100.9 F (38.3 C) (!) 100.9 F (38.3 C) (!) 100.8 F (38.2 C) (!) 100.6 F (38.1 C)  TempSrc:      SpO2: 100% 99% 99% 99%  Weight:      Height:        Intake/Output Summary (Last 24 hours) at 08/20/2019 1149 Last data filed at 08/20/2019 0400 Gross per 24 hour  Intake 1435.78 ml  Output 990 ml  Net 445.78 ml   Filed Weights   08/17/19 0440 08/18/19 0500 08/19/19 0500  Weight: 83.7 kg 85.3 kg 82.7 kg    General appearance: Intubated and sedated Resp: Rhonchorous breath sounds on the right.  Coarse on the left with crackles.  No wheezing Cardio: S1-S2 is normal regular.  No S3-S4.  No rubs murmurs or bruit GI: Abdomen is soft.  Nontender nondistended.  Bowel sounds are present normal.  No masses organomegaly Extremities: Edema noted bilateral lower extremities Neurologic: Intubated and sedated   Lab Results:  Data Reviewed: I have personally reviewed following labs and imaging studies  CBC: Recent Labs  Lab 08/16/19 0430   08/17/19 0515  08/18/19 0355  08/19/19 0500 08/19/19 1104 08/19/19 1108 08/20/19 0400 08/20/19 0514  WBC 9.0  --  8.2  --  6.7  --  9.1  --   --  8.7  --   NEUTROABS 8.3*  --  7.7  --  6.1  --  8.0*  --   --  7.2  --   HGB 8.3*   < > 7.5*   < > 6.8*   < > 7.7* 6.8* 6.8* 6.7* 7.8*  HCT 26.6*   < > 23.8*   < > 21.4*   < > 24.7* 20.0* 20.0* 21.6* 23.0*  MCV 96.0  --  95.6  --  96.0  --  93.9  --   --  97.3  --   PLT 129*  --  127*  --  142*  --  170  --   --  187  --    < > = values in this interval not displayed.    Basic Metabolic Panel: Recent Labs  Lab 08/14/19 0530  08/15/19 0500  08/17/19 1525  08/18/19 0355  08/18/19 1640  08/19/19 0225 08/19/19 0500 08/19/19 1104 08/19/19 1108 08/19/19 1600 08/20/19 0400 08/20/19 0514  NA 136   < >  139   < > 141   < > 143   < > 143   < > 142 142 141 141 140  --  140  K 4.6   < > 3.9   < > 3.9   < > 3.8   < > 3.8   < > 3.5 3.4* 3.4* 3.6 3.7  --  4.2  CL 95*   < > 92*   < > 92*   < > 92*   < > 92*   < > 90* 91* 88* 88* 90*  --   --   CO2 27   < > 34*   < > 36*  --  37*  --  39*  --   --  37*  --   --  38*  --   --   GLUCOSE 237*   < > 147*   < > 193*   < > 164*   < > 168*   < > 157* 175* 206* 156* 183*  --   --   BUN 49*   < > 45*   < > 48*   < > 56*   < > 51*   < > 43* 52* 35* 45* 49*  --   --   CREATININE 2.39*   < > 2.19*   < > 2.08*   < > 2.02*   < > 2.04*   < > 1.90* 1.96* 1.60* 2.10* 2.01*  --   --   CALCIUM 7.6*   < > 7.7*   < > 8.3*  --  8.3*  --  8.6*  --   --  8.4*  --   --  8.4*  --   --   MG 2.3  --  2.1  --   --   --   --   --   --   --   --   --   --   --   --   --   --   PHOS 5.6*   < > 3.3   < > 2.9  --  2.7  --  2.2*  --   --  2.6  --   --  2.6 4.9*  --    < > = values in this interval not displayed.    GFR: Estimated Creatinine Clearance: 37.3 mL/min (A) (by C-G formula based on SCr of 2.01 mg/dL (H)).  Liver Function Tests: Recent Labs  Lab 08/14/19 0530  08/15/19 0500  08/16/19 0430  08/17/19 1525 08/18/19  0355 08/18/19 1640 08/19/19 0500 08/19/19 1600  AST 25  --  29  --  38  --   --   --   --   --   --   ALT 8  --  6  --  8  --   --   --   --   --   --   ALKPHOS 61  --  59  --  71  --   --   --   --   --   --   BILITOT 0.8  --  0.3  --  0.6  --   --   --   --   --   --   PROT 6.2*  --  6.0*  --  6.2*  --   --   --   --   --   --   ALBUMIN 2.4*  2.4*   < >  2.2*  2.2*   < > 2.1*  2.2*   < > 1.8* 1.8* 1.7* 2.1* 1.8*   < > = values in this interval not displayed.     CBG: Recent Labs  Lab 08/19/19 2022 08/20/19 0027 08/20/19 0407 08/20/19 0826 08/20/19 1131  GLUCAP 128* 158* 148* 146* 147*      Recent Results (from the past 240 hour(s))  Culture, respiratory (non-expectorated)     Status: None   Collection Time: 08/13/19 11:30 AM   Specimen: Bronchoalveolar Lavage; Respiratory  Result Value Ref Range Status   Specimen Description   Final    BRONCHIAL ALVEOLAR LAVAGE Performed at [facility], [address], [place], [place] [address]    Special Requests   Final    Normal Performed at [facility] [address] [place], [place] [address]    Gram Stain   Final    MODERATE WBC PRESENT,BOTH PMN AND MONONUCLEAR NO ORGANISMS SEEN Performed at [facility], [place] [address], [place], [place] [address]    Culture   Final    FEW PSEUDOMONAS AERUGINOSA FEW ACINETOBACTER CALCOACETICUS/BAUMANNII COMPLEX    Report Status 08/15/2019 FINAL  Final   Organism ID, Bacteria PSEUDOMONAS AERUGINOSA  Final   Organism ID, Bacteria ACINETOBACTER CALCOACETICUS/BAUMANNII COMPLEX  Final      Susceptibility   Acinetobacter calcoaceticus/baumannii complex - MIC*    CEFTAZIDIME 8 SENSITIVE Sensitive     CEFTRIAXONE 16 INTERMEDIATE Intermediate     CIPROFLOXACIN 0.5 SENSITIVE Sensitive     GENTAMICIN 4 SENSITIVE Sensitive     IMIPENEM <=0.25 SENSITIVE Sensitive     PIP/TAZO 16 SENSITIVE Sensitive     TRIMETH/SULFA <=20 SENSITIVE Sensitive     CEFEPIME 16  INTERMEDIATE Intermediate     AMPICILLIN/SULBACTAM <=2 SENSITIVE Sensitive     * FEW ACINETOBACTER CALCOACETICUS/BAUMANNII COMPLEX   Pseudomonas aeruginosa - MIC*    CEFTAZIDIME 4 SENSITIVE Sensitive     CIPROFLOXACIN <=0.25 SENSITIVE Sensitive     GENTAMICIN <=1 SENSITIVE Sensitive     IMIPENEM 1 SENSITIVE Sensitive     PIP/TAZO 8 SENSITIVE Sensitive     CEFEPIME <=1 SENSITIVE Sensitive     * FEW PSEUDOMONAS AERUGINOSA  Fungus Culture With Stain     Status: None   Collection Time: 08/13/19 11:30 AM  Result Value Ref Range Status   Fungus Stain Final report  Final    Comment: (NOTE) Performed At: [facility] [place], [place] [id number] Rush Farmer MD RK:[id number]    Fungus (Mycology) Culture PENDING  Incomplete   Fungal Source BRONCHIAL ALVEOLAR LAVAGE  Corrected    Comment: Performed at [facility], [place] [address], [place], [place] [address] CORRECTED ON 10/21 AT 1659: PREVIOUSLY REPORTED AS TRACHEAL ASPIRATE   Aspergillus Ag, BAL/Serum     Status: None   Collection Time: 08/13/19 11:30 AM  Result Value Ref Range Status   Aspergillus Ag, BAL/Serum 0.06 0.00 - 0.49 Index Final    Comment: (NOTE) Performed At: [facility] [address] [place], [place] [id number] Rush Farmer MD GY:[id number] Performed At: [facility] [place], [place] [id number] Katina Degree MDPhD [id number]   Fungus Culture Result     Status: None   Collection Time: 08/13/19 11:30 AM  Result Value Ref Range Status   Result 1 Comment  Final    Comment: (NOTE) KOH/Calcofluor preparation:  no fungus observed. Performed At: [facility] [place],  [place] [id number] Rush Farmer MD ZD:[id number]   Expectorated sputum assessment w rflx to resp cult     Status: None   Collection Time: 08/16/19  9:25 AM   Specimen: Expectorated Sputum  Result Value Ref Range Status   Specimen Description EXPECTORATED SPUTUM  Final    Special Requests Normal  Final   Sputum evaluation   Final    THIS SPECIMEN IS ACCEPTABLE FOR SPUTUM CULTURE Performed at [facility], [name] [address] [place], [place] [address]    Report Status 08/16/2019 FINAL  Final  Fungus Culture With Stain     Status: None (Preliminary result)   Collection Time: 08/16/19  9:25 AM  Result Value Ref Range Status   Fungus Stain Final report  Final    Comment: (NOTE) Performed At: [facility] [place], [place] [id number] Rush Farmer MD [id number]    Fungus (Mycology) Culture PENDING  Incomplete   Fungal Source SPUTUM  Final    Comment: Performed at [facility], [place] [address] [place], [place] [address]  Culture, respiratory     Status: None   Collection Time: 08/16/19  9:25 AM  Result Value Ref Range Status   Specimen Description   Final    EXPECTORATED SPUTUM Performed at [facility] [address], [place], [place] [address]    Special Requests   Final    Normal Reflexed from [id number] Performed at [facility], [place] [address], [place], [place] [address]    Gram Stain   Final    ABUNDANT WBC PRESENT, PREDOMINANTLY PMN NO ORGANISMS SEEN Performed at [facility], [place] [address], [place], [place] [address]    Culture FEW PSEUDOMONAS AERUGINOSA  Final   Report Status 08/18/2019 FINAL  Final   Organism ID, Bacteria PSEUDOMONAS AERUGINOSA  Final      Susceptibility   Pseudomonas aeruginosa - MIC*    CEFTAZIDIME 4 SENSITIVE Sensitive     CIPROFLOXACIN <=0.25 SENSITIVE Sensitive     GENTAMICIN <=1 SENSITIVE Sensitive     IMIPENEM 1 SENSITIVE Sensitive     PIP/TAZO 8 SENSITIVE Sensitive     CEFEPIME 4 SENSITIVE Sensitive     * FEW PSEUDOMONAS AERUGINOSA  Fungus Culture Result     Status: None   Collection Time: 08/16/19  9:25 AM  Result Value Ref Range Status   Result 1 Comment  Final    Comment: (NOTE) KOH/Calcofluor  preparation:  no fungus observed. Performed At: [facility] [place], [place] [id number] Rush Farmer MD WN:[id number]       Radiology Studies: Dg Chest Port 1 View  Result Date: 08/20/2019 CLINICAL DATA:  68 year old male with a history of intubation EXAM: PORTABLE CHEST 1 VIEW COMPARISON:  08/19/2019, 08/18/2019, 08/17/2019 FINDINGS: Cardiomediastinal silhouette unchanged in size and contour. Low lung volumes persist. Unchanged endotracheal tube which terminates 2.4 cm above the carina. Unchanged gastric tube terminating in the stomach. Unchanged left IJ temporary hemodialysis catheter which terminates at the confluence of the left brachiocephalic vein and SVC. Unchanged right subclavian central venous catheter with the tip appearing to terminate superior right atrium. Patchy opacities of the bilateral lungs, unchanged from the prior. No pneumothorax. Blunting of left costophrenic angle with opacity in the retrocardiac region. IMPRESSION: Similar appearance of the lungs with low lung volumes and bilateral patchy opacities, likely combination of atelectasis/consolidation, developing chronic changes, and possible small left pleural effusion. Unchanged endotracheal tube, left IJ temporary hemodialysis catheter,  right subclavian central venous catheter, and gastric tube. Electronically Signed   By: Corrie Mckusick D.O.   On: 08/20/2019 07:50   Dg Chest Port 1 View  Result Date: 08/19/2019 CLINICAL DATA:  Pneumonia and COVID-19. EXAM: PORTABLE CHEST 1 VIEW COMPARISON:  August 18, 2019. FINDINGS: Stable cardiomediastinal silhouette. Endotracheal and nasogastric tubes are unchanged in position. Left internal jugular and right subclavian catheters are unchanged in position. Stable right-sided chest tube is noted with probable stable loculated right basilar hydropneumothorax. Stable opacities are noted throughout both lungs concerning for pneumonia. Bony thorax is unremarkable.  IMPRESSION: Stable support apparatus. Stable right-sided chest tube with probable right basilar hydropneumothorax. Stable bilateral lung opacities. Electronically Signed   By: Marijo Conception M.D.   On: 08/19/2019 07:44       LOS: 26 days   Yunuen Mordan [employer] Pager on [email or web address]  08/20/2019, 11:49 AM

## 2019-08-20 NOTE — Progress Notes (Signed)
Bladder Scan 24 ml

## 2019-08-20 NOTE — Progress Notes (Signed)
[name] Progress Note    68 year old gentleman who was admitted with Covid pneumonia July 13, 2019 completed course of remdesivir convalescent plasma Decadron treated with vancomycin and cefepime from 08/01/2019 until 08/02/2019. On 08/02/2019 patient developed an ileus. His creatinine on 07/25/2019 was 1.5 which appears to be his baseline. On 08/02/2019 started to increase. On 08/02/2019 was found to have MSSA bacteremia and was started on Ancef. His blood pressure has been marginal with drops in blood pressure to 70 mmHg. His renal ultrasound showed no hydronephrosis on 08/07/2019. Pt had administration of intravenous contrast on 07/25/2019.  Levo gtt off at this time.  Assessment/ Plan:    Acute kidney injury: on CKD w/ baseline creat of 1.5. Admitted with Covid pneumonia complicated course with recent history of MSSA bacteremia. Developed shock hypotension and required pressors. No evidence of hydronephrosis on renal ultrasound. Suspected ATN.There was administration of contrast but this was early on the course of his illness on 07/25/2019.Formetabolic acidosis was started on CRRT 08/07/2019. No signs renal of recovery at this time. ? CRRT held yest due to alkalosis from citrate protocol  ? Spoke w/ CCM will resume CRRT , will use low-dose (max 750 u/hr) heparin into the CRRT circuit , no further citrate for now ? Keep + 50 /hr on CRRT  Anemia / sp GIB - as per primary team transfuse as necessary. Will start darbe at 60 ug weekly. Check fe/ tibc  COVID-19 pneumonia- Treated with remdesivir, dexamethasone and convalescent plasma  Afib / RVR - per CCM  GIB - acute, sp EGD 10/17 w/ large gastric ulcer/ w visible vessel and signs of recent bleeding, rx'd by injection+ clipping.   VDRF/ R PTX w/ chest tube and air leak - will need trach possibly soon  Shock - Off and on low pressors  [name], MD 08/20/2019, 10:55 AM   Subjective:   Off pressors  and tolerating CRRT.   Objective:   BP (!) 91/51   Pulse (!) 112   Temp (!) 101.7 F (38.7 C)   Resp (!) 34   Ht 5\' 10"  (1.778 m)   Wt 82.7 kg   SpO2 100%   BMI 26.16 kg/m   Intake/Output Summary (Last 24 hours) at 08/20/2019 1055 Last data filed at 08/20/2019 0400 Gross per 24 hour  Intake 1555.78 ml  Output 1028 ml  Net 527.78 ml   Weight change:   Physical Exam: Seen in ICU, on vent, sedated L IJ temp HD cath No jvd Chest cta bilat Cor reg no RG Abd soft obese Ext  no LE edema Neuro sedated   Imaging: Dg Chest Port 1 View  Result Date: 08/20/2019 CLINICAL DATA:  68 year old male with a history of intubation EXAM: PORTABLE CHEST 1 VIEW COMPARISON:  08/19/2019, 08/18/2019, 08/17/2019 FINDINGS: Cardiomediastinal silhouette unchanged in size and contour. Low lung volumes persist. Unchanged endotracheal tube which terminates 2.4 cm above the carina. Unchanged gastric tube terminating in the stomach. Unchanged left IJ temporary hemodialysis catheter which terminates at the confluence of the left brachiocephalic vein and SVC. Unchanged right subclavian central venous catheter with the tip appearing to terminate superior right atrium. Patchy opacities of the bilateral lungs, unchanged from the prior. No pneumothorax. Blunting of left costophrenic angle with opacity in the retrocardiac region. IMPRESSION: Similar appearance of the lungs with low lung volumes and bilateral patchy opacities, likely combination of atelectasis/consolidation, developing chronic changes, and possible small left pleural effusion. Unchanged endotracheal tube, left IJ temporary hemodialysis catheter, right  subclavian central venous catheter, and gastric tube. Electronically Signed   By: Corrie Mckusick D.O.   On: 08/20/2019 07:50   Dg Chest Port 1 View  Result Date: 08/19/2019 CLINICAL DATA:  Pneumonia and COVID-19. EXAM: PORTABLE CHEST 1 VIEW COMPARISON:  August 18, 2019. FINDINGS: Stable  cardiomediastinal silhouette. Endotracheal and nasogastric tubes are unchanged in position. Left internal jugular and right subclavian catheters are unchanged in position. Stable right-sided chest tube is noted with probable stable loculated right basilar hydropneumothorax. Stable opacities are noted throughout both lungs concerning for pneumonia. Bony thorax is unremarkable. IMPRESSION: Stable support apparatus. Stable right-sided chest tube with probable right basilar hydropneumothorax. Stable bilateral lung opacities. Electronically Signed   By: Marijo Conception M.D.   On: 08/19/2019 07:44    Labs: BMET Recent Labs  Lab 08/16/19 1655  08/17/19 0514  08/17/19 1525  08/18/19 0355  08/18/19 1640  08/18/19 1732 08/19/19 0218 08/19/19 0225 08/19/19 0500 08/19/19 1104 08/19/19 1108 08/19/19 1600 08/20/19 0400 08/20/19 0514  NA 139   < > 141   < > 141   < > 143   < > 143   < > 141 141 142 142 141 141 140  --  140  K 4.1   < > 3.9   < > 3.9   < > 3.8   < > 3.8   < > 3.7 3.7 3.5 3.4* 3.4* 3.6 3.7  --  4.2  CL 96*   < > 93*   < > 92*   < > 92*   < > 92*   < > 88* 90* 90* 91* 88* 88* 90*  --   --   CO2 32  --  34*  --  36*  --  37*  --  39*  --   --   --   --  37*  --   --  38*  --   --   GLUCOSE 203*   < > 181*   < > 193*   < > 164*   < > 168*   < > 222* 212* 157* 175* 206* 156* 183*  --   --   BUN 39*   < > 48*   < > 48*   < > 56*   < > 51*   < > 35* 35* 43* 52* 35* 45* 49*  --   --   CREATININE 2.09*   < > 2.10*   < > 2.08*   < > 2.02*   < > 2.04*   < > 1.40* 1.40* 1.90* 1.96* 1.60* 2.10* 2.01*  --   --   CALCIUM 7.9*  --  8.2*  --  8.3*  --  8.3*  --  8.6*  --   --   --   --  8.4*  --   --  8.4*  --   --   PHOS 3.1  --  3.1  --  2.9  --  2.7  --  2.2*  --   --   --   --  2.6  --   --  2.6 4.9*  --    < > = values in this interval not displayed.   CBC Recent Labs  Lab 08/17/19 0515  08/18/19 0355  08/19/19 0500 08/19/19 1104 08/19/19 1108 08/20/19 0400 08/20/19 0514  WBC 8.2  --   6.7  --  9.1  --   --  8.7  --   NEUTROABS  7.7  --  6.1  --  8.0*  --   --  7.2  --   HGB 7.5*   < > 6.8*   < > 7.7* 6.8* 6.8* 6.7* 7.8*  HCT 23.8*   < > 21.4*   < > 24.7* 20.0* 20.0* 21.6* 23.0*  MCV 95.6  --  96.0  --  93.9  --   --  97.3  --   PLT 127*  --  142*  --  170  --   --  187  --    < > = values in this interval not displayed.    Medications:    . sodium chloride   Intravenous Once  . chlorhexidine  15 mL Mouth/Throat BID  . Chlorhexidine Gluconate Cloth  6 each Topical Daily  . doxazosin  2 mg Per Tube Daily  . feeding supplement (PRO-STAT SUGAR FREE 64)  60 mL Per Tube TID  . heparin injection (subcutaneous)  7,500 Units Subcutaneous Q8H  . insulin aspart  0-9 Units Subcutaneous Q4H  . mouth rinse  15 mL Mouth Rinse 10 times per day  . pantoprazole (PROTONIX) IV  40 mg Intravenous Q12H  . polyethylene glycol  17 g Per Tube QHS  . sennosides  5 mL Per Tube QHS  . sucralfate  1 g Oral Q6H

## 2019-08-20 NOTE — Progress Notes (Signed)
Nutrition Follow-up  DOCUMENTATION CODES:   Not applicable  INTERVENTION:   D/C Pivot 1.5  Vital 1.5 @ 60 ml/hr (1440 ml/day) via OG tube  60 ml Prostat TID  Provides: 2760 kcal, 187 grams protein, and 1100 ml    NUTRITION DIAGNOSIS:   Inadequate oral intake related to inability to eat(pt sedated and ventilated) as evidenced by NPO status.  Ongoing.   GOAL:   Provide needs based on ASPEN/SCCM guidelines  Progressing.   MONITOR:   Vent status, Labs, Weight trends, TF tolerance, Skin, I & O's  REASON FOR ASSESSMENT:   Consult Enteral/tube feeding initiation and management  ASSESSMENT:   68 y/o male admitted with PNA and COVID 19  Pt discussed during ICU rounds and with RN.  Per nephrology may need to transfer to [facility] for iHD as he is not a good candidate for CRRT.   10/14 CT inserted for large pneumothorax, HD cath placed for initiation of CVVHD - renal consulted   10/15 CRRT initiated, endobronchial blocker placed  10/17 GIB due to large gastric ulcer s/p EGD with clipping 10/21 bronch with mucus plug; Cortrak placed, xray pending for placement. 10/23 cortrak clogged and replaced with OG tube; no tolerance issue with OG tube noted   Patient is currently intubated on ventilator support MV: 12.8 L/min Temp (24hrs), Avg:99.8 F (37.7 C), Min:87.4 F (30.8 C), Max:101.7 F (38.7 C) MAP: a-line: 58-54-56  Medications reviewed and include: miralax, senokot, carafate Levophed @ 8 mcg (quad strength)  Labs reviewed CBG: 115-112 16 F OG tube R Chest tube: 60 ml out  MAP: 63-73 0 UOP     TF: Pivot 1.5 @ 50 ml/hr (1200 ml/day) via OG tube 60 ml Prostat TID Provides: 2400 kcal, 202 grams protein, and 910 ml free water.   Diet Order:   Diet Order            Diet NPO time specified  Diet effective now              EDUCATION NEEDS:   No education needs have been identified at this time  Skin:  Skin Assessment: Skin Integrity Issues: Skin  Integrity Issues:: Stage I Stage I: lip  Last BM:  150 ml x 24 hrs via rectal tube  Height:   Ht Readings from Last 1 Encounters:  08/19/19 5\' 10"  (1.778 m)    Weight:   Wt Readings from Last 1 Encounters:  08/19/19 82.7 kg    Ideal Body Weight:  75.45 kg  BMI:  Body mass index is 26.16 kg/m.  Estimated Nutritional Needs:   Kcal:  2270-2700  Protein:  140-180 grams  Fluid:  >1.9L/day  [place], [place], [place] Pager [phone] After Hours Pager

## 2019-08-20 NOTE — Progress Notes (Signed)
NAME:  [name], MRN:  [id number], DOB:  [birth year], LOS: 26 ADMISSION DATE:  07/25/2019, CONSULTATION DATE:  10/3 REFERRING MD:  Nadara Mustard, CHIEF COMPLAINT:  Dyspnea   Brief History   68 yo male dx with COVID 07/22/19 presented to [facility] ER 07/25/19 with progressive dyspnea and hypoxia requiring intubation from COVID 19 pneumonia.  Past Medical History  GERD  Significant Hospital Events   10/03 Admit to [facility] from [facility] ER, start decadron and remdesivir, given tociluzimab; prone positioning 10/04 convalescent plasma 10/05 stop prone positioning 10/06 convalescent plasma; prone positioning again 10/09 start ABx 10/10 MSSA bacteremia, CVL d/ced; ID consulted; increase OG tube outpt 10/11 vent weaning trial started 10/13 fever, pneumothorax, pig tail chest tube placed 10/14 worsening hypotension and renal fx >> consulted nephrology; worsening PTX >> replaced chest tube; GNR in sputum >> ABx changed 10/15 start CRRT; endobronchial blocker placed for persistent air leak 10/16 endobronchial blocker repositioned, changed chest tube to water seal; melana with ABLA >> GI consulted; transfuse PRBC 10/17 persistent air leak, increased WOB; start nimbex gtt >> air leak decreased; EGD; A fib with RVR >> start amiodarone 10/18 transfuse PRBC; GI s/o 10/20 resume heparin gtt 10/21 stopped nimbex, chest tube to suction, stopped heparin drip because Hgb dropped again 10/22 TPA in chest tube, repositioned endobronchial blocker 10/23 deflated endobronchial balloon 10/24 small air leak  10/25 removed bronchial blocker  Consults:  ID Nephrology Gastroenterology PCCM  Procedures:  10/2 ETT>  10/2 R IJ CVL > 10/10 10/13 Rt pig tail chest tube >> 10/14 10/14 R 36F chest tube >  10/14 R subclavian CVL >  10/14 L IJ HD cath >  10/14 Lt radial a line >   Significant Diagnostic Tests:  10/2 CT angiogram chest > extensive bilateral airspace disease predominantly posterior 10/8 doppler legs b/l >  no DVT 10/15 renal u/s > normal 10/17 EGD > non bleeding gastric ulcer  10/19 Echo >> EF 60 to 65%, hyperdynamic LV with small LVOT gradient 10/22 CT chest > large collection of air in pleural space with fluid, pneumonia bilaterally R>L endobronchial blocker in place, chest tube anterior  Micro Data:  9/20 SARS COV 2 > POSITIVE 10/2 blood > negative 10/9 blood > MSSA 2/4 10/9 resp > MSSA, Pseudomonas 10/13 blood >> negative 10/21 bronch wash bacterial > pseduomonas, acinetobacter baumannii 10/21 bronch wash fungal >  10/21 bronch wash aspergillus antigen> negative 10/24 bronchoscopy wash>  10/28 Blood cx>>> 10/28 Sputum cx>>>  Antimicrobials:  10/3 remdesivir > 10/6 10/3 actemra 10/3 decadron > 10/12 10/4 convalescent plasma 10/6 convalescent plasma  10/9 vancomycin > 10/10 10/9 cefepime > 10/10 10/10 ancef > 10/13 10/13 cefepime > 10/20 10/14 anidulofungin > 10/15  10/20 ancef > 10/23 10/23 meropenem >   10/21 anidulofungin> 10/22 10/22 voriconazole > 10/23 Merrem and Vanc reordered 10/28>>>  Interim history/subjective:   Much larger leak overnight Hg drop and evaluated by GI, follow clinically CRRT to even at this point Fever overnight  Objective   Blood pressure (!) 82/52, pulse (!) 116, temperature (!) 101.5 F (38.6 C), resp. rate (!) 33, height 5\' 10"  (1.778 m), weight 82.7 kg, SpO2 99 %. CVP:  [4 mmHg] 4 mmHg  Vent Mode: PRVC FiO2 (%):  [40 %] 40 % Set Rate:  [16 bmp] 16 bmp Vt Set:  [550 mL] 550 mL PEEP:  [5 cmH20] 5 cmH20 Pressure Support:  [8 cmH20] 8 cmH20   Intake/Output Summary (Last 24 hours) at 08/20/2019 0817 Last data  filed at 08/20/2019 0400 Gross per 24 hour  Intake 1795.76 ml  Output 1207 ml  Net 588.76 ml   Filed Weights   08/17/19 0440 08/18/19 0500 08/19/19 0500  Weight: 83.7 kg 85.3 kg 82.7 kg    Examination:  General:  Acutely ill appearing male, NAD HENT: [place]/AT, PERRL, EOM-I and MMM, ETT in place PULM: Coarse BS  diffusely, Much larger leak overnight CV: RRR, Nl S1/S2 and -M/R/G GI: Soft, NT, ND and +BS MSK: -edema and -tenderness Neuro: opens eyes to voice, doesn't move arms or legs  I reviewed CXR myself, ETT is in a good position and CT present with no PTX  Discussed with TRH-MD  Resolved Hospital Problem list   Atrial fibrillation Septic shock  Assessment & Plan:  ARDS due to COVID-19 pneumonia: improved oxygenation Ventilatory synchrony best with pressure control, not changed much with sedation Continue to attempt and eliminate sedation Suspect patient will need tracheostomy if continues to fail weaning Begin PS trials Fluid to even Wean PEEP/FiO2 for SaO2 > 85% VAP prevention CXR and ABG in AM  R Bronchopleural fistula, persistent air leak 10/26, bronchial blocker was up for over a week, fistula didn't heal so removed blocker to reduce likelihood of repeated episodes of pneumonia Suspect that endobronchial valve would be associated with more pneumonia like endobronchial blocker Likely necrotic RLL, unclear if fistula will heal Empyema R Continue chest tube to wall suction as per advice from thoracic surgery Will eventually need consideration of VATS, but would wait to see how well he wakes up first before we consider that, CVTS is aware of patient Monitor ABG/CXR Continue merrem for empyema given fever, may need to reculture if persists  Acute kidney injury: Metabolic alklalosis on ABG from 10/26 Continue CVVHD to even Monitor BMET and UOP Replace electrolytes as needed  Need for sedation/mechanical ventilation RASS target 0 to -1 Neuromuscular weakness Intermittent tachypnea: have seen this with many COVID patients, doesn't seem to be affected by medications RASS target 0 to -1 Continue fentanyl patch, minimize intermittent sedation unless causing significantly high pressures on vent with dyssychrony as I would really like to evaluate mental status but this is likely a long  way to recovery  Gastric ulcer: PPI  Anemia, acute hemorrhagic: Hgb down again on 10/23, spurious? Subcutaneous heparin for DVT prophylaxis  MSSA bacteremia Acinetobacter and Pseudomonal pneumonia> RLL necrotic? Continue meropenem for 7 days, re-assess at day 7 as may need more   Spoke with renal.  Will restart CRRT.  Continue weaning efforts.  Will not proceed with trach now since patient is attempting weaning.  If fails in the next 24-48 hours then will trach.  PCCM will continue to follow  Best practice:  Diet: tube feeding Pain/Anxiety/Delirium protocol (if indicated): as above VAP protocol (if indicated): yes DVT prophylaxis: prophylactic dosing heparin GI prophylaxis: pantoprazole Glucose control: SSI Mobility: bed rest Code Status: DNR Family Communication: I updated [name] today by phone Disposition: remain in ICU  Labs   CBC: Recent Labs  Lab 08/15/19 0500  08/15/19 1510  08/16/19 0430  08/17/19 0515  08/18/19 0355  08/19/19 0225 08/19/19 0500 08/19/19 1104 08/19/19 1108 08/20/19 0514  WBC 7.5  --  10.2  --  9.0  --  8.2  --  6.7  --   --  9.1  --   --   --   NEUTROABS 6.9  --   --   --  8.3*  --  7.7  --  6.1  --   --  8.0*  --   --   --   HGB 7.3*   < > 8.6*   < > 8.3*   < > 7.5*   < > 6.8*   < > 8.8* 7.7* 6.8* 6.8* 7.8*  HCT 23.1*   < > 26.9*   < > 26.6*   < > 23.8*   < > 21.4*   < > 26.0* 24.7* 20.0* 20.0* 23.0*  MCV 96.3  --  95.7  --  96.0  --  95.6  --  96.0  --   --  93.9  --   --   --   PLT 122*  --  149*  --  129*  --  127*  --  142*  --   --  170  --   --   --    < > = values in this interval not displayed.    Basic Metabolic Panel: Recent Labs  Lab 08/14/19 0530  08/15/19 0500  08/17/19 1525  08/18/19 0355  08/18/19 1640  08/19/19 0225 08/19/19 0500 08/19/19 1104 08/19/19 1108 08/19/19 1600 08/20/19 0514  NA 136   < > 139   < > 141   < > 143   < > 143   < > 142 142 141 141 140 140  K 4.6   < > 3.9   < > 3.9   < > 3.8   < > 3.8   < >  3.5 3.4* 3.4* 3.6 3.7 4.2  CL 95*   < > 92*   < > 92*   < > 92*   < > 92*   < > 90* 91* 88* 88* 90*  --   CO2 27   < > 34*   < > 36*  --  37*  --  39*  --   --  37*  --   --  38*  --   GLUCOSE 237*   < > 147*   < > 193*   < > 164*   < > 168*   < > 157* 175* 206* 156* 183*  --   BUN 49*   < > 45*   < > 48*   < > 56*   < > 51*   < > 43* 52* 35* 45* 49*  --   CREATININE 2.39*   < > 2.19*   < > 2.08*   < > 2.02*   < > 2.04*   < > 1.90* 1.96* 1.60* 2.10* 2.01*  --   CALCIUM 7.6*   < > 7.7*   < > 8.3*  --  8.3*  --  8.6*  --   --  8.4*  --   --  8.4*  --   MG 2.3  --  2.1  --   --   --   --   --   --   --   --   --   --   --   --   --   PHOS 5.6*   < > 3.3   < > 2.9  --  2.7  --  2.2*  --   --  2.6  --   --  2.6  --    < > = values in this interval not displayed.   GFR: Estimated Creatinine Clearance: 37.3 mL/min (A) (by C-G formula based on SCr of 2.01 mg/dL (H)). Recent Labs  Lab 08/16/19 0430 08/17/19 0515 08/18/19 0355 08/19/19  0500  WBC 9.0 8.2 6.7 9.1    Liver Function Tests: Recent Labs  Lab 08/14/19 0530  08/15/19 0500  08/16/19 0430  08/17/19 1525 08/18/19 0355 08/18/19 1640 08/19/19 0500 08/19/19 1600  AST 25  --  29  --  38  --   --   --   --   --   --   ALT 8  --  6  --  8  --   --   --   --   --   --   ALKPHOS 61  --  59  --  71  --   --   --   --   --   --   BILITOT 0.8  --  0.3  --  0.6  --   --   --   --   --   --   PROT 6.2*  --  6.0*  --  6.2*  --   --   --   --   --   --   ALBUMIN 2.4*  2.4*   < > 2.2*  2.2*   < > 2.1*  2.2*   < > 1.8* 1.8* 1.7* 2.1* 1.8*   < > = values in this interval not displayed.   No results for input(s): LIPASE, AMYLASE in the last 168 hours. No results for input(s): AMMONIA in the last 168 hours.  ABG    Component Value Date/Time   PHART 7.444 08/20/2019 0514   PCO2ART 59.7 (H) 08/20/2019 0514   PO2ART 106.0 08/20/2019 0514   HCO3 40.4 (H) 08/20/2019 0514   TCO2 42 (H) 08/20/2019 0514   ACIDBASEDEF 2.0 08/14/2019 0515    O2SAT 98.0 08/20/2019 0514     Coagulation Profile: No results for input(s): INR, PROTIME in the last 168 hours.  Cardiac Enzymes: No results for input(s): CKTOTAL, CKMB, CKMBINDEX, TROPONINI in the last 168 hours.  HbA1C: Hgb A1c MFr Bld  Date/Time Value Ref Range Status  07/26/2019 05:00 AM 6.1 (H) 4.8 - 5.6 % Final    Comment:    (NOTE) Pre diabetes:          5.7%-6.4% Diabetes:              >6.4% Glycemic control for   <7.0% adults with diabetes     CBG: Recent Labs  Lab 08/19/19 1107 08/19/19 1558 08/19/19 2022 08/20/19 0027 08/20/19 0407  GLUCAP 138* 173* 128* 158* 148*   The patient is critically ill with multiple organ systems failure and requires high complexity decision making for assessment and support, frequent evaluation and titration of therapies, application of advanced monitoring technologies and extensive interpretation of multiple databases.   Critical Care Time devoted to patient care services described in this note is  36  Minutes. This time reflects time of care of this signee Dr Jennet Maduro. This critical care time does not reflect procedure time, or teaching time or supervisory time of PA/NP/Med student/Med Resident etc but could involve care discussion time.  Rush Farmer, M.D. [facility] Pulmonary/Critical Care Medicine.

## 2019-08-20 NOTE — Progress Notes (Signed)
SpO2 as low as 60% d/t ventilator asynchrony. RT at bedside making ventilator changes with no improvement. Fentanyl push given; pt currently tolerating vent better with sats > 90%

## 2019-08-20 NOTE — Progress Notes (Signed)
Transfer of care to Verdis Frederickson, RN d/t pt restarted on CRRT & 1/1 RN.

## 2019-08-21 ENCOUNTER — Inpatient Hospital Stay (HOSPITAL_COMMUNITY): Payer: Medicare HMO

## 2019-08-21 DIAGNOSIS — E872 Acidosis: Secondary | ICD-10-CM | POA: Diagnosis not present

## 2019-08-21 DIAGNOSIS — U071 COVID-19: Secondary | ICD-10-CM | POA: Diagnosis not present

## 2019-08-21 DIAGNOSIS — J9600 Acute respiratory failure, unspecified whether with hypoxia or hypercapnia: Secondary | ICD-10-CM | POA: Diagnosis not present

## 2019-08-21 DIAGNOSIS — D649 Anemia, unspecified: Secondary | ICD-10-CM | POA: Diagnosis not present

## 2019-08-21 DIAGNOSIS — D62 Acute posthemorrhagic anemia: Secondary | ICD-10-CM

## 2019-08-21 DIAGNOSIS — E8770 Fluid overload, unspecified: Secondary | ICD-10-CM | POA: Diagnosis not present

## 2019-08-21 DIAGNOSIS — I959 Hypotension, unspecified: Secondary | ICD-10-CM | POA: Diagnosis not present

## 2019-08-21 DIAGNOSIS — J9811 Atelectasis: Secondary | ICD-10-CM | POA: Diagnosis not present

## 2019-08-21 DIAGNOSIS — A4901 Methicillin susceptible Staphylococcus aureus infection, unspecified site: Secondary | ICD-10-CM | POA: Diagnosis not present

## 2019-08-21 DIAGNOSIS — R509 Fever, unspecified: Secondary | ICD-10-CM | POA: Diagnosis not present

## 2019-08-21 DIAGNOSIS — Z4682 Encounter for fitting and adjustment of non-vascular catheter: Secondary | ICD-10-CM | POA: Diagnosis not present

## 2019-08-21 DIAGNOSIS — J1289 Other viral pneumonia: Secondary | ICD-10-CM | POA: Diagnosis not present

## 2019-08-21 DIAGNOSIS — Z930 Tracheostomy status: Secondary | ICD-10-CM | POA: Diagnosis not present

## 2019-08-21 DIAGNOSIS — R579 Shock, unspecified: Secondary | ICD-10-CM | POA: Diagnosis not present

## 2019-08-21 DIAGNOSIS — J80 Acute respiratory distress syndrome: Secondary | ICD-10-CM

## 2019-08-21 DIAGNOSIS — K250 Acute gastric ulcer with hemorrhage: Secondary | ICD-10-CM | POA: Diagnosis not present

## 2019-08-21 DIAGNOSIS — Z9689 Presence of other specified functional implants: Secondary | ICD-10-CM | POA: Diagnosis not present

## 2019-08-21 DIAGNOSIS — J9601 Acute respiratory failure with hypoxia: Secondary | ICD-10-CM | POA: Diagnosis not present

## 2019-08-21 DIAGNOSIS — N179 Acute kidney failure, unspecified: Secondary | ICD-10-CM | POA: Diagnosis not present

## 2019-08-21 LAB — CBC WITH DIFFERENTIAL/PLATELET
Abs Immature Granulocytes: 0.46 10*3/uL — ABNORMAL HIGH (ref 0.00–0.07)
Basophils Absolute: 0 10*3/uL (ref 0.0–0.1)
Basophils Relative: 0 %
Eosinophils Absolute: 0.2 10*3/uL (ref 0.0–0.5)
Eosinophils Relative: 2 %
HCT: 29.4 % — ABNORMAL LOW (ref 39.0–52.0)
Hemoglobin: 9 g/dL — ABNORMAL LOW (ref 13.0–17.0)
Immature Granulocytes: 4 %
Lymphocytes Relative: 9 %
Lymphs Abs: 0.9 10*3/uL (ref 0.7–4.0)
MCH: 29.1 pg (ref 26.0–34.0)
MCHC: 30.6 g/dL (ref 30.0–36.0)
MCV: 95.1 fL (ref 80.0–100.0)
Monocytes Absolute: 0.5 10*3/uL (ref 0.1–1.0)
Monocytes Relative: 4 %
Neutro Abs: 8.3 10*3/uL — ABNORMAL HIGH (ref 1.7–7.7)
Neutrophils Relative %: 81 %
Platelets: 181 10*3/uL (ref 150–400)
RBC: 3.09 MIL/uL — ABNORMAL LOW (ref 4.22–5.81)
RDW: 16.5 % — ABNORMAL HIGH (ref 11.5–15.5)
WBC: 10.4 10*3/uL (ref 4.0–10.5)
nRBC: 2.4 % — ABNORMAL HIGH (ref 0.0–0.2)

## 2019-08-21 LAB — RENAL FUNCTION PANEL
Albumin: 2.2 g/dL — ABNORMAL LOW (ref 3.5–5.0)
Albumin: 2.2 g/dL — ABNORMAL LOW (ref 3.5–5.0)
Anion gap: 10 (ref 5–15)
Anion gap: 7 (ref 5–15)
BUN: 79 mg/dL — ABNORMAL HIGH (ref 8–23)
BUN: 61 mg/dL — ABNORMAL HIGH (ref 8–23)
CO2: 29 mmol/L (ref 22–32)
CO2: 27 mmol/L (ref 22–32)
Calcium: 8.5 mg/dL — ABNORMAL LOW (ref 8.9–10.3)
Calcium: 8.5 mg/dL — ABNORMAL LOW (ref 8.9–10.3)
Chloride: 100 mmol/L (ref 98–111)
Chloride: 104 mmol/L (ref 98–111)
Creatinine, Ser: 3.1 mg/dL — ABNORMAL HIGH (ref 0.61–1.24)
Creatinine, Ser: 2.79 mg/dL — ABNORMAL HIGH (ref 0.61–1.24)
GFR calc Af Amer: 23 mL/min — ABNORMAL LOW (ref >60)
GFR calc Af Amer: 26 mL/min — ABNORMAL LOW (ref >60)
GFR calc non Af Amer: 20 mL/min — ABNORMAL LOW (ref >60)
GFR calc non Af Amer: 23 mL/min — ABNORMAL LOW (ref >60)
Glucose, Bld: 155 mg/dL — ABNORMAL HIGH (ref 70–99)
Glucose, Bld: 150 mg/dL — ABNORMAL HIGH (ref 70–99)
Phosphorus: 4.8 mg/dL — ABNORMAL HIGH (ref 2.5–4.6)
Phosphorus: 3.9 mg/dL (ref 2.5–4.6)
Potassium: 4.7 mmol/L (ref 3.5–5.1)
Potassium: 5 mmol/L (ref 3.5–5.1)
Sodium: 139 mmol/L (ref 135–145)
Sodium: 138 mmol/L (ref 135–145)

## 2019-08-21 LAB — POCT I-STAT 7, (LYTES, BLD GAS, ICA,H+H)
Acid-Base Excess: 3 mmol/L — ABNORMAL HIGH (ref 0.0–2.0)
Bicarbonate: 29.8 mmol/L — ABNORMAL HIGH (ref 20.0–28.0)
Calcium, Ion: 1.23 mmol/L (ref 1.15–1.40)
HCT: 26 % — ABNORMAL LOW (ref 39.0–52.0)
Hemoglobin: 8.8 g/dL — ABNORMAL LOW (ref 13.0–17.0)
O2 Saturation: 98 %
Patient temperature: 36.4
Potassium: 4.6 mmol/L (ref 3.5–5.1)
Sodium: 138 mmol/L (ref 135–145)
TCO2: 32 mmol/L (ref 22–32)
pCO2 arterial: 56.2 mmHg — ABNORMAL HIGH (ref 32.0–48.0)
pH, Arterial: 7.329 — ABNORMAL LOW (ref 7.350–7.450)
pO2, Arterial: 105 mmHg (ref 83.0–108.0)

## 2019-08-21 LAB — TYPE AND SCREEN
ABO/RH(D): O POS
Antibody Screen: NEGATIVE
Unit division: 0
Unit division: 0
Unit division: 0

## 2019-08-21 LAB — PROCALCITONIN: Procalcitonin: 5.15 ng/mL

## 2019-08-21 LAB — GLUCOSE, CAPILLARY
Glucose-Capillary: 127 mg/dL — ABNORMAL HIGH (ref 70–99)
Glucose-Capillary: 136 mg/dL — ABNORMAL HIGH (ref 70–99)
Glucose-Capillary: 136 mg/dL — ABNORMAL HIGH (ref 70–99)
Glucose-Capillary: 143 mg/dL — ABNORMAL HIGH (ref 70–99)
Glucose-Capillary: 144 mg/dL — ABNORMAL HIGH (ref 70–99)
Glucose-Capillary: 145 mg/dL — ABNORMAL HIGH (ref 70–99)
Glucose-Capillary: 147 mg/dL — ABNORMAL HIGH (ref 70–99)

## 2019-08-21 LAB — MAGNESIUM: Magnesium: 2.4 mg/dL (ref 1.7–2.4)

## 2019-08-21 LAB — BPAM RBC
Blood Product Expiration Date: 202011292359
Blood Product Expiration Date: 202011292359
Blood Product Expiration Date: 202011292359
ISSUE DATE / TIME: 202010261701
ISSUE DATE / TIME: 202010281703
ISSUE DATE / TIME: 202010282027
Unit Type and Rh: 5100
Unit Type and Rh: 5100
Unit Type and Rh: 5100

## 2019-08-21 LAB — BASIC METABOLIC PANEL
Anion gap: 8 (ref 5–15)
BUN: 74 mg/dL — ABNORMAL HIGH (ref 8–23)
CO2: 28 mmol/L (ref 22–32)
Calcium: 8.6 mg/dL — ABNORMAL LOW (ref 8.9–10.3)
Chloride: 102 mmol/L (ref 98–111)
Creatinine, Ser: 3 mg/dL — ABNORMAL HIGH (ref 0.61–1.24)
GFR calc Af Amer: 24 mL/min — ABNORMAL LOW (ref >60)
GFR calc non Af Amer: 21 mL/min — ABNORMAL LOW (ref >60)
Glucose, Bld: 147 mg/dL — ABNORMAL HIGH (ref 70–99)
Potassium: 4.7 mmol/L (ref 3.5–5.1)
Sodium: 138 mmol/L (ref 135–145)

## 2019-08-21 LAB — APTT: aPTT: 53 seconds — ABNORMAL HIGH (ref 24–36)

## 2019-08-21 LAB — CALCIUM, IONIZED: Calcium, Ionized, Serum: 4.2 mg/dL — ABNORMAL LOW (ref 4.5–5.6)

## 2019-08-21 LAB — PHOSPHORUS: Phosphorus: 5 mg/dL — ABNORMAL HIGH (ref 2.5–4.6)

## 2019-08-21 MED ORDER — VANCOMYCIN HCL IN DEXTROSE 750-5 MG/150ML-% IV SOLN
750.0000 mg | INTRAVENOUS | Status: DC
  Administered 2019-08-21 – 2019-08-22 (×2): 750 mg via INTRAVENOUS
  Filled 2019-08-21 (×3): qty 150

## 2019-08-21 MED ORDER — SODIUM CHLORIDE 0.9 % IV SOLN
INTRAVENOUS | Status: DC | PRN
  Administered 2019-08-21 – 2019-09-15 (×6): 250 mL via INTRAVENOUS

## 2019-08-21 NOTE — Progress Notes (Signed)
Spoke with and updated pt's wife over the phone. I was able to answer any questions that she had at this time. I placed the phone to the patient's ear so that she could talk to him.She was very [employer] attorney.

## 2019-08-21 NOTE — Progress Notes (Signed)
Per CCM MD pt ETT instilled with 10cc normal saline. Pt suctioned with scant to no return. Pt tolerated well, RT will continue to monitor.

## 2019-08-21 NOTE — Progress Notes (Signed)
PROGRESS NOTE  [name] TLX:[id number] DOB: [birth year] DOA: 07/25/2019  PCP: Kathyrn Drown, MD  Brief History/Interval Summary: 68 year old with a history of GERD and BPH who presented to [facility] ED with S OB and was found to be hypoxic.  He was initially diagnosed with COVID-19 September 20.  1 to 2 days prior to his presentation he developed worsening shortness of breath with anorexia.  His wife's physician during a teleconference call noted that the patient himself is very tachypneic and ultimately EMS was sent to the house.  EMS found the patient to have saturations in the 40s.  In the ED on nonrebreather the patient sats only improved to the 80s.  The patient was severely confused and required emergent intubation.  Chest x-ray confirmed multifocal infiltrates.  CT chest was negative for PE.  Reason for Visit: Acute respiratory failure with hypoxia.  Pneumonia due to COVID-19.  Significant Events: 10/3 admit to [facility] from [place] ED 10/13 right pneumothorax -pigtail chest tube placed 10/14 Nephrology consulted 10/15 begin CRRT -endobronchial blocker placed 10/16 melena 10/17 A. fib with RVR -EGD 10/18 transfuse PRBC 10/20 Heparin drip resumed 10/21 abrupt drop in hemoglobin -transfuse 1 unit PRBC 10/23 tPA per chest tube  10/25 endobronchial blocker removed  10/26 1U PRBC 10/28: 2 units of PRBC transfused  Consultants: Pulmonology.  Nephrology.  Gastroenterology.  Procedures:  EGD 10/17:   Impression:               - No active bleeding or blood or coffee ground                            material in the upper tract at the time of this                            exam.                           - Normal esophagus.                           - Excessive gastric fluid (residual tube feeding)                            without anatomic gastric outlet obstruction. Fluid                            aspiration performed. The stomach was somewhat                             dilated and patulous, causing looping of the                            endoscope along the greater curve during                            advancement, making it difficult to traverse the                            pylorus, although this was eventually successfully  accomplished.                           - Non-bleeding gastric ulcer with pigmented                            material suggesting stigma of recent hemorrhage.                            Injected with good blanching. Clips were placed,                            effectively sealing off the ulcer.                           - Normal examined duodenum.   Antibiotics: Anti-infectives (From admission, onward)   Start     Dose/Rate Route Frequency Ordered Stop   08/21/19 1700  vancomycin (VANCOCIN) IVPB 750 mg/150 ml premix     750 mg 150 mL/hr over 60 Minutes Intravenous Every 24 hours 08/21/19 0843     08/21/19 1400  vancomycin (VANCOCIN) IVPB 750 mg/150 ml premix  Status:  Discontinued     750 mg 150 mL/hr over 60 Minutes Intravenous Every 24 hours 08/20/19 1146 08/20/19 1218   08/21/19 1000  meropenem (MERREM) 1 g in sodium chloride 0.9 % 100 mL IVPB     1 g 200 mL/hr over 30 Minutes Intravenous Every 12 hours 08/20/19 1535     08/21/19 0413  meropenem (MERREM) 500 mg in sodium chloride 0.9 % 100 mL IVPB  Status:  Discontinued     500 mg 200 mL/hr over 30 Minutes Intravenous Every 24 hours 08/20/19 1245 08/20/19 1535   08/20/19 1330  vancomycin (VANCOCIN) 1,750 mg in sodium chloride 0.9 % 500 mL IVPB     1,750 mg 250 mL/hr over 120 Minutes Intravenous  Once 08/20/19 1222 08/20/19 1619   08/20/19 1300  vancomycin (VANCOCIN) 1,250 mg in sodium chloride 0.9 % 250 mL IVPB  Status:  Discontinued     1,250 mg 166.7 mL/hr over 90 Minutes Intravenous  Once 08/20/19 1146 08/20/19 1218   08/20/19 1224  vancomycin variable dose per unstable renal function (pharmacist dosing)  Status:  Discontinued       Does not apply See admin instructions 08/20/19 1225 08/21/19 1152   08/15/19 1600  meropenem (MERREM) 1 g in sodium chloride 0.9 % 100 mL IVPB  Status:  Discontinued     1 g 200 mL/hr over 30 Minutes Intravenous Every 12 hours 08/15/19 1543 08/20/19 1245   08/15/19 1000  voriconazole (VFEND) tablet 200 mg  Status:  Discontinued     200 mg Per Tube Every 12 hours 08/14/19 1148 08/15/19 1134   08/14/19 1600  anidulafungin (ERAXIS) 100 mg in sodium chloride 0.9 % 100 mL IVPB  Status:  Discontinued     100 mg 78 mL/hr over 100 Minutes Intravenous Every 24 hours 08/13/19 1645 08/14/19 1148   08/14/19 1300  voriconazole (VFEND) tablet 400 mg     400 mg Per Tube Every 12 hours 08/14/19 1148 08/14/19 2217   08/13/19 1700  anidulafungin (ERAXIS) 200 mg in sodium chloride 0.9 % 200 mL IVPB     200 mg 78 mL/hr over 200 Minutes Intravenous  Once 08/13/19 1645 08/13/19 2030  08/13/19 0200  ceFAZolin (ANCEF) IVPB 2g/100 mL premix  Status:  Discontinued     2 g 200 mL/hr over 30 Minutes Intravenous Every 12 hours 08/11/19 1512 08/15/19 1543   08/12/19 0200  ceFEPIme (MAXIPIME) 2 g in sodium chloride 0.9 % 100 mL IVPB     2 g 200 mL/hr over 30 Minutes Intravenous Every 12 hours 08/11/19 1509 08/12/19 1341   08/07/19 2200  ceFEPIme (MAXIPIME) 2 g in sodium chloride 0.9 % 100 mL IVPB  Status:  Discontinued     2 g 200 mL/hr over 30 Minutes Intravenous Every 12 hours 08/07/19 1540 08/11/19 1509   08/07/19 1000  anidulafungin (ERAXIS) 100 mg in sodium chloride 0.9 % 100 mL IVPB  Status:  Discontinued     100 mg 78 mL/hr over 100 Minutes Intravenous Every 24 hours 08/06/19 0934 08/08/19 1203   08/07/19 0800  ceFEPIme (MAXIPIME) 2 g in sodium chloride 0.9 % 100 mL IVPB  Status:  Discontinued     2 g 200 mL/hr over 30 Minutes Intravenous Every 24 hours 08/06/19 1036 08/07/19 1540   08/06/19 1000  anidulafungin (ERAXIS) 200 mg in sodium chloride 0.9 % 200 mL IVPB    Note to Pharmacy: please   200 mg 78  mL/hr over 200 Minutes Intravenous Every 24 hours 08/06/19 0925 08/06/19 1600   08/06/19 0800  ceFEPIme (MAXIPIME) 2 g in sodium chloride 0.9 % 100 mL IVPB  Status:  Discontinued     2 g 200 mL/hr over 30 Minutes Intravenous Every 12 hours 08/06/19 0733 08/06/19 1036   08/02/19 2200  ceFAZolin (ANCEF) IVPB 2g/100 mL premix  Status:  Discontinued     2 g 200 mL/hr over 30 Minutes Intravenous Every 8 hours 08/02/19 1338 08/06/19 0733   08/02/19 1400  vancomycin (VANCOCIN) 1,250 mg in sodium chloride 0.9 % 250 mL IVPB  Status:  Discontinued     1,250 mg 166.7 mL/hr over 90 Minutes Intravenous Every 24 hours 08/01/19 1320 08/02/19 1332   08/01/19 1330  vancomycin (VANCOCIN) 2,000 mg in sodium chloride 0.9 % 500 mL IVPB     2,000 mg 250 mL/hr over 120 Minutes Intravenous  Once 08/01/19 1158 08/01/19 1636   08/01/19 1300  ceFEPIme (MAXIPIME) 2 g in sodium chloride 0.9 % 100 mL IVPB  Status:  Discontinued     2 g 200 mL/hr over 30 Minutes Intravenous Every 12 hours 08/01/19 1158 08/02/19 1332   07/26/19 1600  remdesivir 100 mg in sodium chloride 0.9 % 250 mL IVPB     100 mg 500 mL/hr over 30 Minutes Intravenous Every 24 hours 07/26/19 0132 07/29/19 1823   07/26/19 0215  remdesivir 200 mg in sodium chloride 0.9 % 250 mL IVPB     200 mg 500 mL/hr over 30 Minutes Intravenous Once 07/26/19 0132 07/26/19 0520      Subjective/Interval History: Patient remains intubated and sedated.     Assessment/Plan:  Acute Hypoxic Resp. Failure/Pneumonia due to COVID-19  Vent Mode: CPAP;PSV FiO2 (%):  [40 %-100 %] 40 % Set Rate:  [16 bmp] 16 bmp Vt Set:  [550 mL] 550 mL PEEP:  [5 cmH20-8 cmH20] 5 cmH20 Pressure Support:  [5 cmH20] 5 cmH20 Plateau Pressure:  [17 cmH20] 17 cmH20     Component Value Date/Time   PHART 7.329 (L) 08/21/2019 0335   PCO2ART 56.2 (H) 08/21/2019 0335   PO2ART 105.0 08/21/2019 0335   HCO3 29.8 (H) 08/21/2019 0335   TCO2 32 08/21/2019 0335  ACIDBASEDEF 2.0 08/14/2019 0515    O2SAT 98.0 08/21/2019 0335    Lab Results  Component Value Date   SARSCOV2NAA Detected (A) 07/22/2019   COVID-19 specific Treatment: Decadron 10/2 > 10/12 Remdesivir 10/2 > 10/6 Actemra 10/2 Convalescent plasma 10/3  From a COVID-19 standpoint patient has completed treatment with Remdesivir steroids Actemra and convalescent plasma.  Patient still requiring mechanical ventilation for acute respiratory failure.  Pulmonology is following and managing.  Patient also has other active pulmonary issues as discussed below including empyema and pneumothorax.  He remains on Levophed.  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  Bedside bronchoscopy revealed material in the airway.  TPA infused via chest tube on 10/23.  Chest tube management per pulmonology.  Endobronchial blocker removed on 10/25.  Pulmonology to discuss with cardiothoracic surgery.  Fever Patient was noted to have fever on 10/27 on 10/28.  WBC was noted to be normal.  Blood cultures were repeated.  Sputum cultures were repeated.  Patient was on antibiotics including meropenem for Pseudomonas in BAL specimen as discussed below.  Vancomycin was added.  Fevers appears to have subsided.  Procalcitonin noted to be 5.15.  WBC is normal.  Pseudomonas and Acinetobacter in BAL specimen Patient on meropenem.  To complete at least 7 days of treatment and then reconsider.  Meropenem was started on 10/23.  Acute renal failure Patient on CRRT.  Nephrology is following and managing.  CRRT was held on 10/27 due to alkalosis from citrate protocol.  There was also an issue of inability to use heparin due to GI bleed.  There was also some mention of potentially transitioning to intermittent hemodialysis.  However CRRT was restarted yesterday with low-dose heparin.    Acute GI bleed secondary to gastric ulcer Patient seen by  gastroenterology.  Underwent EGD with clipping of vessel and large ulcer.  Anticoagulation was initially held and resumed on 10/20.  However there was an abrupt drop in hemoglobin subsequently.  There was a suspicion that he was having experiencing slow intermittent GI bleed.  He was transfused 1 unit of PRBC on 10/26.  Continue PPI.  Twice a day.  H. pylori antibody was noted to be elevated.  Patient will need 2 weeks treatment for H. pylori when he is ready for discharge.  Plus he will need follow-up with [facility] gastroenterology.  Acute blood loss anemia Likely due to GI bleed.  Patient has been transfused with 5 units of PRBC so far during this hospitalization.  2 units were transfused yesterday.  Hemoglobin has responded appropriately.  Continue to monitor.    Markedly elevated D-dimer Noted to be significantly elevated on 10/19 greater than 20.  Unable to fully anticoagulate due to GI bleed.  No evidence for DVT on Dopplers on 10/8.  Thought to be related to suspected empyema in the right lung.  D-dimer improved to 8.83 on 10/23.  At this time do not anticipate any further work-up as there are many reasons why the patient could have elevated D-dimer.  Paroxysmal atrial fibrillation with RVR Patient without any prior history of atrial fibrillation.  Atrial fibrillation likely occurred in the setting of acute illness.  Currently sinus rhythm.  Unable to fully anticoagulate due to GI bleeding.  MSSA bacteremia/septic shock 2 out of 2 blood cultures positive on 10/9.  Patient completed course of antibiotics for this indication.  Shock resolved.  Repeat cultures pending due  to recurrence of fever.  History of BPH Monitor for urinary retention  FEN On CRRT.  Monitor electrolytes.  On tube feedings.  Pressure injuries Pressure Injury 08/16/19 Lip Medial;Lower Stage I -  Intact skin with non-blanchable redness of a localized area usually over a bony prominence. healing  (Active)  08/16/19 2000   Location: Lip  Location Orientation: Medial;Lower  Staging: Stage I -  Intact skin with non-blanchable redness of a localized area usually over a bony prominence.  Wound Description (Comments): healing   Present on Admission: No     Pressure Injury 08/16/19 Lip Lower;Mid Stage I -  Intact skin with non-blanchable redness of a localized area usually over a bony prominence. healing  (Active)  08/16/19 2342  Location: Lip  Location Orientation: Lower;Mid  Staging: Stage I -  Intact skin with non-blanchable redness of a localized area usually over a bony prominence.  Wound Description (Comments): healing   Present on Admission:     DVT Prophylaxis: Subcutaneous heparin Code Status: DNR Family Communication: Discussed in detail with patient's wife yesterday. Disposition Plan: Remain in ICU.  Prognosis is guarded.   Medications:  Scheduled:  chlorhexidine  15 mL Mouth/Throat BID   Chlorhexidine Gluconate Cloth  6 each Topical Daily   darbepoetin (ARANESP) injection - NON-DIALYSIS  100 mcg Subcutaneous Q Wed-1800   doxazosin  2 mg Per Tube Daily   feeding supplement (PRO-STAT SUGAR FREE 64)  60 mL Per Tube TID   heparin injection (subcutaneous)  7,500 Units Subcutaneous Q8H   insulin aspart  0-9 Units Subcutaneous Q4H   mouth rinse  15 mL Mouth Rinse 10 times per day   pantoprazole (PROTONIX) IV  40 mg Intravenous Q12H   polyethylene glycol  17 g Per Tube QHS   sennosides  5 mL Per Tube QHS   sucralfate  1 g Oral Q6H   Continuous:   prismasol BGK 4/2.5 400 mL/hr at 08/21/19 0650    prismasol BGK 4/2.5 200 mL/hr at 08/20/19 1629   sodium chloride 5 mL/hr at 08/21/19 1200   sodium chloride 10 mL/hr at 08/21/19 1200   dextrose Stopped (08/16/19 1623)   feeding supplement (VITAL 1.5 CAL) 60 mL/hr at 08/21/19 0100   heparin 10,000 units/ 20 mL infusion syringe 500 Units/hr (08/20/19 2253)   meropenem (MERREM) IV Stopped (08/21/19 1002)   norepinephrine  (LEVOPHED) Adult infusion Stopped (08/21/19 0654)   prismasol BGK 4/2.5 1,800 mL/hr at 08/21/19 0959   vancomycin     RJJ:OACZYS chloride, sodium chloride, acetaminophen (TYLENOL) oral liquid 160 mg/5 mL, alteplase, dextrose, heparin, heparin, metoprolol tartrate, midazolam, pneumococcal 23 valent vaccine, sodium chloride   Objective:  Vital Signs  Vitals:   08/21/19 1030 08/21/19 1100 08/21/19 1130 08/21/19 1200  BP: (!) 73/48 (!) 87/57 (!) 97/56 93/61  Pulse: 85 84 78 89  Resp: (!) 26 (!) 28 (!) 24 (!) 30  Temp: 98.1 F (36.7 C) 98.2 F (36.8 C) 98.6 F (37 C) 98.4 F (36.9 C)  TempSrc:      SpO2: 98% 96% 99% 98%  Weight:      Height:        Intake/Output Summary (Last 24 hours) at 08/21/2019 1220 Last data filed at 08/21/2019 1200 Gross per 24 hour  Intake 4956.14 ml  Output 2566 ml  Net 2390.14 ml   Filed Weights   08/18/19 0500 08/19/19 0500 08/21/19 0500  Weight: 85.3 kg 82.7 kg 84.1 kg     General appearance: Intubated and sedated Resp:  Coarse breath sounds with rhonchi on the right.  No wheezing.  Crackles appreciated.   Cardio: S1-S2 is normal regular.  No S3-S4.  No rubs or bruit. GI: Abdomen is soft.  Nontender nondistended.  Bowel sounds are present normal.  No masses organomegaly Extremities: Minimal edema bilateral lower extremities. Neurologic: Intubated and sedated.   Lab Results:  Data Reviewed: I have personally reviewed following labs and imaging studies  CBC: Recent Labs  Lab 08/17/19 0515  08/18/19 0355  08/19/19 0500  08/19/19 1108 08/20/19 0400 08/20/19 0514 08/21/19 0335 08/21/19 0500  WBC 8.2  --  6.7  --  9.1  --   --  8.7  --   --  10.4  NEUTROABS 7.7  --  6.1  --  8.0*  --   --  7.2  --   --  8.3*  HGB 7.5*   < > 6.8*   < > 7.7*   < > 6.8* 6.7* 7.8* 8.8* 9.0*  HCT 23.8*   < > 21.4*   < > 24.7*   < > 20.0* 21.6* 23.0* 26.0* 29.4*  MCV 95.6  --  96.0  --  93.9  --   --  97.3  --   --  95.1  PLT 127*  --  142*  --  170  --    --  187  --   --  181   < > = values in this interval not displayed.    Basic Metabolic Panel: Recent Labs  Lab 08/15/19 0500  08/19/19 0500  08/19/19 1108 08/19/19 1600 08/20/19 0400 08/20/19 0514 08/20/19 1643 08/21/19 0335 08/21/19 0455 08/21/19 0500  NA 139   < > 142   < > 141 140  --  140 142 138 139 138  K 3.9   < > 3.4*   < > 3.6 3.7  --  4.2 4.6 4.6 4.7 4.7  CL 92*   < > 91*   < > 88* 90*  --   --  95*  --  100 102  CO2 34*   < > 37*  --   --  38*  --   --  34*  --  29 28  GLUCOSE 147*   < > 175*   < > 156* 183*  --   --  142*  --  155* 147*  BUN 45*   < > 52*   < > 45* 49*  --   --  115*  --  79* 74*  CREATININE 2.19*   < > 1.96*   < > 2.10* 2.01*  --   --  4.87*  --  3.10* 3.00*  CALCIUM 7.7*   < > 8.4*  --   --  8.4*  --   --  8.3*  --  8.5* 8.6*  MG 2.1  --   --   --   --   --   --   --   --   --   --  2.4  PHOS 3.3   < > 2.6  --   --  2.6 4.9*  --  6.8*  --  4.8* 5.0*   < > = values in this interval not displayed.    GFR: Estimated Creatinine Clearance: 25 mL/min (A) (by C-G formula based on SCr of 3 mg/dL (H)).  Liver Function Tests: Recent Labs  Lab 08/15/19 0500  08/16/19 0430  08/18/19 1640 08/19/19 0500 08/19/19 1600 08/20/19 1643 08/21/19  0455  AST 29  --  38  --   --   --   --   --   --   ALT 6  --  8  --   --   --   --   --   --   ALKPHOS 59  --  71  --   --   --   --   --   --   BILITOT 0.3  --  0.6  --   --   --   --   --   --   PROT 6.0*  --  6.2*  --   --   --   --   --   --   ALBUMIN 2.2*   2.2*   < > 2.1*   2.2*   < > 1.7* 2.1* 1.8* 1.9* 2.2*   < > = values in this interval not displayed.     CBG: Recent Labs  Lab 08/20/19 2001 08/21/19 0016 08/21/19 0345 08/21/19 0746 08/21/19 1154  GLUCAP 153* 147* 127* 143* 136*      Recent Results (from the past 240 hour(s))  Culture, respiratory (non-expectorated)     Status: None   Collection Time: 08/13/19 11:30 AM   Specimen: Bronchoalveolar Lavage; Respiratory  Result Value  Ref Range Status   Specimen Description   Final    BRONCHIAL ALVEOLAR LAVAGE Performed at [facility], [address], [place], [place] [address]    Special Requests   Final    Normal Performed at [facility] [address], [place], [place] [address]    Gram Stain   Final    MODERATE WBC PRESENT,BOTH PMN AND MONONUCLEAR NO ORGANISMS SEEN Performed at [facility], [place] [address] [place], [place] [address]    Culture   Final    FEW PSEUDOMONAS AERUGINOSA FEW ACINETOBACTER CALCOACETICUS/BAUMANNII COMPLEX    Report Status 08/15/2019 FINAL  Final   Organism ID, Bacteria PSEUDOMONAS AERUGINOSA  Final   Organism ID, Bacteria ACINETOBACTER CALCOACETICUS/BAUMANNII COMPLEX  Final      Susceptibility   Acinetobacter calcoaceticus/baumannii complex - MIC*    CEFTAZIDIME 8 SENSITIVE Sensitive     CEFTRIAXONE 16 INTERMEDIATE Intermediate     CIPROFLOXACIN 0.5 SENSITIVE Sensitive     GENTAMICIN 4 SENSITIVE Sensitive     IMIPENEM <=0.25 SENSITIVE Sensitive     PIP/TAZO 16 SENSITIVE Sensitive     TRIMETH/SULFA <=20 SENSITIVE Sensitive     CEFEPIME 16 INTERMEDIATE Intermediate     AMPICILLIN/SULBACTAM <=2 SENSITIVE Sensitive     * FEW ACINETOBACTER CALCOACETICUS/BAUMANNII COMPLEX   Pseudomonas aeruginosa - MIC*    CEFTAZIDIME 4 SENSITIVE Sensitive     CIPROFLOXACIN <=0.25 SENSITIVE Sensitive     GENTAMICIN <=1 SENSITIVE Sensitive     IMIPENEM 1 SENSITIVE Sensitive     PIP/TAZO 8 SENSITIVE Sensitive     CEFEPIME <=1 SENSITIVE Sensitive     * FEW PSEUDOMONAS AERUGINOSA  Fungus Culture With Stain     Status: None   Collection Time: 08/13/19 11:30 AM  Result Value Ref Range Status   Fungus Stain Final report  Final    Comment: (NOTE) Performed At: [facility] [place], [place] [id number] Rush Farmer MD [id number]    Fungus (Mycology) Culture PENDING  Incomplete   Fungal Source BRONCHIAL ALVEOLAR LAVAGE  Corrected     Comment: Performed at [facility], [place] [address] [place], [place] [address] CORRECTED  ON 10/21 AT 1659: PREVIOUSLY REPORTED AS TRACHEAL ASPIRATE   Aspergillus Ag, BAL/Serum     Status: None   Collection Time: 08/13/19 11:30 AM  Result Value Ref Range Status   Aspergillus Ag, BAL/Serum 0.06 0.00 - 0.49 Index Final    Comment: (NOTE) Performed At: [facility] [place] [address] [place], [place] [id number] Rush Farmer MD WY:[id number] Performed At: [facility] [place], [place] [id number] Katina Degree MDPhD [id number]   Fungus Culture Result     Status: None   Collection Time: 08/13/19 11:30 AM  Result Value Ref Range Status   Result 1 Comment  Final    Comment: (NOTE) KOH/Calcofluor preparation:  no fungus observed. Performed At: [facility] [place], [place] [id number] Rush Farmer MD LY:[id number]   Expectorated sputum assessment w rflx to resp cult     Status: None   Collection Time: 08/16/19  9:25 AM   Specimen: Expectorated Sputum  Result Value Ref Range Status   Specimen Description EXPECTORATED SPUTUM  Final   Special Requests Normal  Final   Sputum evaluation   Final    THIS SPECIMEN IS ACCEPTABLE FOR SPUTUM CULTURE Performed at [facility], [place] [address], [place], [place] [address]    Report Status 08/16/2019 FINAL  Final  Fungus Culture With Stain     Status: None (Preliminary result)   Collection Time: 08/16/19  9:25 AM  Result Value Ref Range Status   Fungus Stain Final report  Final    Comment: (NOTE) Performed At: [facility] [place], [place] [id number] Rush Farmer MD [id number]    Fungus (Mycology) Culture PENDING  Incomplete   Fungal Source SPUTUM  Final    Comment: Performed at [facility], [place] [address] [place], [place] [address]  Culture, respiratory     Status: None   Collection Time: 08/16/19  9:25 AM  Result Value  Ref Range Status   Specimen Description   Final    EXPECTORATED SPUTUM Performed at [facility] [address] [place], [place] [address]    Special Requests   Final    Normal Reflexed from [id number] Performed at [facility], [place] [address], [place], [place] [address]    Gram Stain   Final    ABUNDANT WBC PRESENT, PREDOMINANTLY PMN NO ORGANISMS SEEN Performed at [facility], [place] [address] [place], [place] [address]    Culture FEW PSEUDOMONAS AERUGINOSA  Final   Report Status 08/18/2019 FINAL  Final   Organism ID, Bacteria PSEUDOMONAS AERUGINOSA  Final      Susceptibility   Pseudomonas aeruginosa - MIC*    CEFTAZIDIME 4 SENSITIVE Sensitive     CIPROFLOXACIN <=0.25 SENSITIVE Sensitive     GENTAMICIN <=1 SENSITIVE Sensitive     IMIPENEM 1 SENSITIVE Sensitive     PIP/TAZO 8 SENSITIVE Sensitive     CEFEPIME 4 SENSITIVE Sensitive     * FEW PSEUDOMONAS AERUGINOSA  Fungus Culture Result     Status: None   Collection Time: 08/16/19  9:25 AM  Result Value Ref Range Status   Result 1 Comment  Final    Comment: (NOTE) KOH/Calcofluor preparation:  no fungus observed. Performed At: [facility] [place], [place] [id number] Rush Farmer MD YB:[id number]   Culture, blood (Routine X 2) w Reflex to ID Panel     Status: None (Preliminary result)   Collection Time: 08/20/19 12:35 PM  Specimen: BLOOD  Result Value Ref Range Status   Specimen Description   Final    BLOOD LEFT HAND Performed at [facility] [address] [place], [place] [address]    Special Requests   Final    BOTTLES DRAWN AEROBIC AND ANAEROBIC Blood Culture adequate volume Performed at [facility] [address] [place], [place] [address]    Culture   Final    NO GROWTH < 24 HOURS Performed at [facility] [address], [place], [place] [address]    Report Status PENDING  Incomplete  Culture,  blood (Routine X 2) w Reflex to ID Panel     Status: None (Preliminary result)   Collection Time: 08/20/19 12:48 PM   Specimen: BLOOD  Result Value Ref Range Status   Specimen Description   Final    BLOOD LEFT ARM Performed at [facility] [address] [place], [place] [address]    Special Requests   Final    BOTTLES DRAWN AEROBIC ONLY Blood Culture adequate volume Performed at [facility] [address] [place], [place] [address]    Culture   Final    NO GROWTH < 24 HOURS Performed at [facility] [address], [place], [place] [address]    Report Status PENDING  Incomplete  Culture, respiratory     Status: None (Preliminary result)   Collection Time: 08/20/19 12:54 PM   Specimen: Tracheal Aspirate  Result Value Ref Range Status   Specimen Description   Final    TRACHEAL ASPIRATE Performed at [facility] [address], [place], [place] [address]    Special Requests   Final    NONE Performed at [facility], [place] [address] [place], [place] [address]    Gram Stain   Final    ABUNDANT WBC PRESENT,BOTH PMN AND MONONUCLEAR NO ORGANISMS SEEN    Culture   Final    CULTURE REINCUBATED FOR BETTER GROWTH Performed at [facility], [place] [address] [place], [place] [address]    Report Status PENDING  Incomplete      Radiology Studies: Dg Chest Port 1 View  Result Date: 08/21/2019 CLINICAL DATA:  COVID-19, intubation EXAM: PORTABLE CHEST 1 VIEW COMPARISON:  Portable exam 9937 hours compared to 08/20/2019 FINDINGS: Tip of endotracheal tube projects 3.4 cm above carina. Feeding tube extends into stomach. LEFT jugular dual-lumen central venous catheter tip projects over confluence of the SVC and LEFT brachiocephalic vein. Tip of RIGHT jugular line projects over RIGHT atrium; recommend withdrawal 4 cm to place tip at cavoatrial junction. RIGHT thoracostomy tube unchanged. Stable heart  size mediastinal contours. Patchy BILATERAL airspace infiltrates consistent with pneumonia. No pleural effusion. Tiny loculated pneumothorax at lateral RIGHT base. IMPRESSION: Tiny residual RIGHT basilar atelectasis despite thoracostomy tube. Persistent BILATERAL pulmonary infiltrates consistent with pneumonia. Electronically Signed   By: Lavonia Dana M.D.   On: 08/21/2019 08:44   Dg Chest Port 1 View  Result Date: 08/20/2019 CLINICAL DATA:  68 year old male with a history of intubation EXAM: PORTABLE CHEST 1 VIEW COMPARISON:  08/19/2019, 08/18/2019, 08/17/2019 FINDINGS: Cardiomediastinal silhouette unchanged in size and contour. Low lung volumes persist. Unchanged endotracheal tube which terminates 2.4 cm above the carina. Unchanged gastric tube terminating in the stomach. Unchanged left IJ temporary hemodialysis catheter which terminates at the confluence of the left brachiocephalic vein and SVC. Unchanged right subclavian central venous catheter with the tip appearing  to terminate superior right atrium. Patchy opacities of the bilateral lungs, unchanged from the prior. No pneumothorax. Blunting of left costophrenic angle with opacity in the retrocardiac region. IMPRESSION: Similar appearance of the lungs with low lung volumes and bilateral patchy opacities, likely combination of atelectasis/consolidation, developing chronic changes, and possible small left pleural effusion. Unchanged endotracheal tube, left IJ temporary hemodialysis catheter, right subclavian central venous catheter, and gastric tube. Electronically Signed   By: Corrie Mckusick D.O.   On: 08/20/2019 07:50       LOS: 75 days   Dheeraj Hail [employer] on [email or web address]  08/21/2019, 12:20 PM

## 2019-08-21 NOTE — Progress Notes (Signed)
Pharmacy Antibiotic Note  [name] is a 68 y.o. male admitted on 07/25/2019 with acute respiratory failure requiring intubation from COVID-19 PNA. Patient is currently on meropenem for MSSA bacteremia and Acinetobacter/Pseudomonal PNA. Pharmacy has been consulted for vancomycin dosing for fever.   Patient has AKI with minimal UOP and received vancomycin loading dose yesterday. He is now on CVVHD which was restarted on 10/28 at 16:28. WBC trending up from 8.7 to 10.4. Patient now normothermic with slow upward trend while on CRRT.   Plan: Start Vancomycin 750 mg IV Q24 hrs at 1700 today Continue Meropenem 1g IV Q12 hrs Monitor cultures, sensitivities, clinical progression, and CRRT  Height: _0  (177.8 cm) Weight: 185 lb 6.5 oz (84.1 kg) IBW/kg (Calculated) : 73  Temp (24hrs), Avg:98.4 F (36.9 C), Min:94.8 F (34.9 C), Max:101.7 F (38.7 C)  Recent Labs  Lab 08/17/19 0515  08/18/19 0355  08/19/19 0500  08/19/19 1108 08/19/19 1600 08/20/19 0400 08/20/19 1643 08/21/19 0455 08/21/19 0500  WBC 8.2  --  6.7  --  9.1  --   --   --  8.7  --   --  10.4  CREATININE  --    < > 2.02*   < > 1.96*   < > 2.10* 2.01*  --  4.87* 3.10* 3.00*   < > = values in this interval not displayed.    Estimated Creatinine Clearance: 25 mL/min (A) (by C-G formula based on SCr of 3 mg/dL (H)).    No Known Allergies  Antimicrobials this admission: 10/3 Actemra x 1  10/3 remdesivir >> 10/6 10/3 cPlasma 10/7 cPlasma not given 10/9 Vancomycin >> 10/10 10/9 Cefepime >> 10/10; 10/14 >> 10/20 10/10 Cefazolin >>10/14; 10/21 >> 10/23 10/14 anidulafungin >> 10/16; 10/21 x1 10/22 Voriconazole >>10/23 10/23 Meropenem >> 10/28 vancomycin >>  Microbiology results: 10/2 BCx: no growth final  10/3 MRSA PCR: negative 10/9 UCx:<10k insig growth 10/9 BCx:4/4 bottles SSA BCID = MSSA 10/9tracheal aspirate: MSSA (pansens), rare pseudomonas (pansens) 10/9 MRSA HCW:CBJSEGBT 10/13 Bcx: negF  10/21  BAL:few pseudomonas, few acinetobacter. Aspergillus Ag: neg 10/24 Sputum: few pseudomonas (likely colonizer at this point)  10/28 BCx: ngtd 10/28 SCx: abundant wbc  Richardine Service, PharmD PGY1 Pharmacy Resident 08/21/2019  8:39 AM

## 2019-08-21 NOTE — Progress Notes (Signed)
Pt's K+ on renal panel this afternoon was 5.0 from 4.7 this AM. Notified Dr Joelyn Oms (nephro on call). No new orders at this time.

## 2019-08-21 NOTE — Progress Notes (Signed)
[facility] Progress Note     Assessment/ Plan:    Acute kidney injury: on CKD w/ baseline creat of 1.5. Admitted with Covid pneumonia complicated course with recent history of MSSA bacteremia. Developed shock hypotension and required pressors. No evidence of hydronephrosis on renal ultrasound. Suspected ATN.There was administration of contrast but this was early on the course of his illness on 07/25/2019.Formetabolic acidosis was started on CRRT 08/07/2019. No signs renal of recovery at this time. ? Citrate protocol caused systemic alkalosis so was dc'd ? Getting in-circuit IV heparin standing dose of 500 u/hr, check PTT daily for monitoring, and which for signs of re- GI bleed ? Will keep even w CRRT    Anemia / sp GIB - as per primary team transfuse as necessary. Started darbe/ esa at 60 ug weekly. Tsat is 33%, no need for Fe.   COVID-19 pneumonia- Treated with remdesivir, dexamethasone and convalescent plasma  Afib / RVR - per CCM  GIB - acute, sp EGD 10/17 w/ large gastric ulcer/ w signs of recent bleeding rx'd by injection+ clipping.   VDRF/ R PTX w/ chest tube and air leak - will need trach possibly soon  Shock - Off and on low dose pressors  [name], MD 08/21/2019, 3:42 PM   Subjective:   Tolerating CRRT on standing dose in-circuit heparin at 500 u/hr.     Objective:   BP 93/65   Pulse 96   Temp 98.6 F (37 C)   Resp (!) 33   Ht 5\' 10"  (1.778 m)   Wt 84.1 kg   SpO2 97%   BMI 26.60 kg/m   Intake/Output Summary (Last 24 hours) at 08/21/2019 1542 Last data filed at 08/21/2019 1500 Gross per 24 hour  Intake 3910.47 ml  Output 2766 ml  Net 1144.47 ml   Weight change:   Physical Exam: Seen in ICU, on vent, sedated L IJ temp HD cath No jvd Chest cta bilat Cor reg no RG Abd soft obese Ext  no LE edema Neuro sedated   Labs: BMET Recent Labs  Lab 08/18/19 0355  08/18/19 1640  08/19/19 0500 08/19/19 1104  08/19/19 1108 08/19/19 1600 08/20/19 0400 08/20/19 0514 08/20/19 1643 08/21/19 0335 08/21/19 0455 08/21/19 0500  NA 143   < > 143   < > 142 141 141 140  --  140 142 138 139 138  K 3.8   < > 3.8   < > 3.4* 3.4* 3.6 3.7  --  4.2 4.6 4.6 4.7 4.7  CL 92*   < > 92*   < > 91* 88* 88* 90*  --   --  95*  --  100 102  CO2 37*  --  39*  --  37*  --   --  38*  --   --  34*  --  29 28  GLUCOSE 164*   < > 168*   < > 175* 206* 156* 183*  --   --  142*  --  155* 147*  BUN 56*   < > 51*   < > 52* 35* 45* 49*  --   --  115*  --  79* 74*  CREATININE 2.02*   < > 2.04*   < > 1.96* 1.60* 2.10* 2.01*  --   --  4.87*  --  3.10* 3.00*  CALCIUM 8.3*  --  8.6*  --  8.4*  --   --  8.4*  --   --  8.3*  --  8.5* 8.6*  PHOS 2.7  --  2.2*  --  2.6  --   --  2.6 4.9*  --  6.8*  --  4.8* 5.0*   < > = values in this interval not displayed.   CBC Recent Labs  Lab 08/18/19 0355  08/19/19 0500  08/20/19 0400 08/20/19 0514 08/21/19 0335 08/21/19 0500  WBC 6.7  --  9.1  --  8.7  --   --  10.4  NEUTROABS 6.1  --  8.0*  --  7.2  --   --  8.3*  HGB 6.8*   < > 7.7*   < > 6.7* 7.8* 8.8* 9.0*  HCT 21.4*   < > 24.7*   < > 21.6* 23.0* 26.0* 29.4*  MCV 96.0  --  93.9  --  97.3  --   --  95.1  PLT 142*  --  170  --  187  --   --  181   < > = values in this interval not displayed.    Medications:    . chlorhexidine  15 mL Mouth/Throat BID  . Chlorhexidine Gluconate Cloth  6 each Topical Daily  . darbepoetin (ARANESP) injection - NON-DIALYSIS  100 mcg Subcutaneous Q Wed-1800  . doxazosin  2 mg Per Tube Daily  . feeding supplement (PRO-STAT SUGAR FREE 64)  60 mL Per Tube TID  . heparin injection (subcutaneous)  7,500 Units Subcutaneous Q8H  . insulin aspart  0-9 Units Subcutaneous Q4H  . mouth rinse  15 mL Mouth Rinse 10 times per day  . pantoprazole (PROTONIX) IV  40 mg Intravenous Q12H  . polyethylene glycol  17 g Per Tube QHS  . sennosides  5 mL Per Tube QHS  . sucralfate  1 g Oral Q6H

## 2019-08-21 NOTE — Progress Notes (Signed)
NAME:  [name], MRN:  [id number], DOB:  1951-10-13, LOS: 96 ADMISSION DATE:  07/25/2019, CONSULTATION DATE:  10/3 REFERRING MD:  Nadara Mustard, CHIEF COMPLAINT:  Dyspnea   Brief History   67 yo male dx with COVID 07/22/19 presented to [facility] ER 07/25/19 with progressive dyspnea and hypoxia requiring intubation from COVID 19 pneumonia.  Past Medical History  GERD  Significant Hospital Events   10/03 Admit to [facility] from [facility] ER, start decadron and remdesivir, given tociluzimab; prone positioning 10/04 convalescent plasma 10/05 stop prone positioning 10/06 convalescent plasma; prone positioning again 10/09 start ABx 10/10 MSSA bacteremia, CVL d/ced; ID consulted; increase OG tube outpt 10/11 vent weaning trial started 10/13 fever, pneumothorax, pig tail chest tube placed 10/14 worsening hypotension and renal fx >> consulted nephrology; worsening PTX >> replaced chest tube; GNR in sputum >> ABx changed 10/15 start CRRT; endobronchial blocker placed for persistent air leak 10/16 endobronchial blocker repositioned, changed chest tube to water seal; melana with ABLA >> GI consulted; transfuse PRBC 10/17 persistent air leak, increased WOB; start nimbex gtt >> air leak decreased; EGD; A fib with RVR >> start amiodarone 10/18 transfuse PRBC; GI s/o 10/20 resume heparin gtt 10/21 stopped nimbex, chest tube to suction, stopped heparin drip because Hgb dropped again 10/22 TPA in chest tube, repositioned endobronchial blocker 10/23 deflated endobronchial balloon 10/24 small air leak  10/25 removed bronchial blocker  Consults:  ID Nephrology Gastroenterology PCCM  Procedures:  10/2 ETT>  10/2 R IJ CVL > 10/10 10/13 Rt pig tail chest tube >> 10/14 10/14 R 69F chest tube >  10/14 R subclavian CVL >  10/14 L IJ HD cath >  10/14 Lt radial a line >   Significant Diagnostic Tests:  10/2 CT angiogram chest > extensive bilateral airspace disease predominantly posterior 10/8 doppler legs b/l >  no DVT 10/15 renal u/s > normal 10/17 EGD > non bleeding gastric ulcer  10/19 Echo >> EF 60 to 65%, hyperdynamic LV with small LVOT gradient 10/22 CT chest > large collection of air in pleural space with fluid, pneumonia bilaterally R>L endobronchial blocker in place, chest tube anterior  Micro Data:  9/20 SARS COV 2 > POSITIVE 10/2 blood > negative 10/9 blood > MSSA 2/4 10/9 resp > MSSA, Pseudomonas 10/13 blood >> negative 10/21 bronch wash bacterial > pseduomonas, acinetobacter baumannii 10/21 bronch wash fungal >  10/21 bronch wash aspergillus antigen> negative 10/24 bronchoscopy wash>  10/28 Blood cx>>> 10/28 Sputum cx>>>  Antimicrobials:  10/3 remdesivir > 10/6 10/3 actemra 10/3 decadron > 10/12 10/4 convalescent plasma 10/6 convalescent plasma  10/9 vancomycin > 10/10 10/9 cefepime > 10/10 10/10 ancef > 10/13 10/13 cefepime > 10/20 10/14 anidulofungin > 10/15  10/20 ancef > 10/23 10/23 meropenem >   10/21 anidulofungin> 10/22 10/22 voriconazole > 10/23 Merrem and Vanc reordered 10/28>>>  Interim history/subjective:   Airleak persists  On PS this AM No further events overnight  Objective   Blood pressure 96/63, pulse 76, temperature 97.7 F (36.5 C), resp. rate (!) 23, height 5\' 10"  (1.778 m), weight 84.1 kg, SpO2 100 %. CVP:  [5 mmHg] 5 mmHg  Vent Mode: CPAP;PSV FiO2 (%):  [40 %-100 %] 40 % Set Rate:  [16 bmp] 16 bmp Vt Set:  [550 mL] 550 mL PEEP:  [5 cmH20-8 cmH20] 5 cmH20 Pressure Support:  [5 cmH20] 5 cmH20 Plateau Pressure:  [12 cmH20-17 cmH20] 17 cmH20   Intake/Output Summary (Last 24 hours) at 08/21/2019 3532 Last data filed at  08/21/2019 0700 Gross per 24 hour  Intake 4353.96 ml  Output 2250 ml  Net 2103.96 ml   Filed Weights   08/18/19 0500 08/19/19 0500 08/21/19 0500  Weight: 85.3 kg 82.7 kg 84.1 kg   Examination:  General:  Acutely ill appearing male, NAD HENT: [place]/AT, PERRL, EOM-I and MMM, ETT in place PULM: Coarse BS diffusely,  R CT in place CV: RRR, Nl S1/S2 and -M/R/G GI: Soft, NT, ND and +BS MSK: -edema and -tenderness Neuro: opens eyes to voice, doesn't move arms or legs  I reviewed CXR myself, ETT ok, CT ok, no PTX  Discussed with TRH-MD  Resolved Hospital Problem list   Atrial fibrillation Septic shock  Assessment & Plan:  ARDS due to COVID-19 pneumonia: improved oxygenation Ventilatory synchrony best with pressure control, not changed much with sedation Continue to minimize sedation If continues to tolerate 5/5 will allow for a trial extubation, if fails then trach Maintain on PS as tolerated for now Additional volume off today, positive 3L in 2 days Wean PEEP/FiO2 for SaO2 > 85% VAP prevention CXR and ABG in AM  R Bronchopleural fistula, persistent air leak 10/26, bronchial blocker was up for over a week, fistula didn't heal so removed blocker to reduce likelihood of repeated episodes of pneumonia Suspect that endobronchial valve would be associated with more pneumonia like endobronchial blocker Likely necrotic RLL, unclear if fistula will heal Empyema R Continue chest tube to wall suction as per advice from thoracic surgery Will eventually need consideration of VATS, but would wait to see how well he wakes up first before we consider that, CVTS is aware of patient Monitor ABG/CXR Continue merrem and vanc F/U on cultures Spoke with CVTS (Dr. Roxan Hockey), he does not feel that a valve will be helpful at this time and to proceed with tracheostomy.  Will proceed with tracheostomy on Friday or Saturday, I have personally called his wife, updated her and obtain consent.  RNs to call for official consent.  Acute kidney injury: Metabolic alklalosis on ABG from 10/26 Change CRRT to negative (postive 3L in 2 days) Monitor BMET and UOP Replace electrolytes as needed  Need for sedation/mechanical ventilation RASS target 0 to -1 Neuromuscular weakness Intermittent tachypnea: have seen this with  many COVID patients, doesn't seem to be affected by medications RASS target 0 to -1 Continue fentanyl patch, minimize intermittent sedation unless causing significantly high pressures on vent with dyssychrony as I would really like to evaluate mental status but this is likely a long way to recovery  Gastric ulcer: PPI  Anemia, acute hemorrhagic: Hgb down again on 10/23, spurious? Subcutaneous heparin for DVT prophylaxis  MSSA bacteremia Acinetobacter and Pseudomonal pneumonia> RLL necrotic? Continue meropenem for 7 days, re-assess at day 7 as may need more   PCCM will continue to follow  Best practice:  Diet: tube feeding Pain/Anxiety/Delirium protocol (if indicated): as above VAP protocol (if indicated): yes DVT prophylaxis: prophylactic dosing heparin GI prophylaxis: pantoprazole Glucose control: SSI Mobility: bed rest Code Status: DNR Family Communication: I updated [name] today by phone Disposition: remain in ICU  Labs   CBC: Recent Labs  Lab 08/17/19 0515  08/18/19 0355  08/19/19 0500  08/19/19 1108 08/20/19 0400 08/20/19 0514 08/21/19 0335 08/21/19 0500  WBC 8.2  --  6.7  --  9.1  --   --  8.7  --   --  10.4  NEUTROABS 7.7  --  6.1  --  8.0*  --   --  7.2  --   --  8.3*  HGB 7.5*   < > 6.8*   < > 7.7*   < > 6.8* 6.7* 7.8* 8.8* 9.0*  HCT 23.8*   < > 21.4*   < > 24.7*   < > 20.0* 21.6* 23.0* 26.0* 29.4*  MCV 95.6  --  96.0  --  93.9  --   --  97.3  --   --  95.1  PLT 127*  --  142*  --  170  --   --  187  --   --  181   < > = values in this interval not displayed.    Basic Metabolic Panel: Recent Labs  Lab 08/15/19 0500  08/19/19 0500  08/19/19 1108 08/19/19 1600 08/20/19 0400 08/20/19 0514 08/20/19 1643 08/21/19 0335 08/21/19 0455 08/21/19 0500  NA 139   < > 142   < > 141 140  --  140 142 138 139 138  K 3.9   < > 3.4*   < > 3.6 3.7  --  4.2 4.6 4.6 4.7 4.7  CL 92*   < > 91*   < > 88* 90*  --   --  95*  --  100 102  CO2 34*   < > 37*  --   --  38*   --   --  34*  --  29 28  GLUCOSE 147*   < > 175*   < > 156* 183*  --   --  142*  --  155* 147*  BUN 45*   < > 52*   < > 45* 49*  --   --  115*  --  79* 74*  CREATININE 2.19*   < > 1.96*   < > 2.10* 2.01*  --   --  4.87*  --  3.10* 3.00*  CALCIUM 7.7*   < > 8.4*  --   --  8.4*  --   --  8.3*  --  8.5* 8.6*  MG 2.1  --   --   --   --   --   --   --   --   --   --  2.4  PHOS 3.3   < > 2.6  --   --  2.6 4.9*  --  6.8*  --  4.8* 5.0*   < > = values in this interval not displayed.   GFR: Estimated Creatinine Clearance: 25 mL/min (A) (by C-G formula based on SCr of 3 mg/dL (H)). Recent Labs  Lab 08/18/19 0355 08/19/19 0500 08/20/19 0400 08/21/19 0500  PROCALCITON  --   --   --  5.15  WBC 6.7 9.1 8.7 10.4    Liver Function Tests: Recent Labs  Lab 08/15/19 0500  08/16/19 0430  08/18/19 1640 08/19/19 0500 08/19/19 1600 08/20/19 1643 08/21/19 0455  AST 29  --  38  --   --   --   --   --   --   ALT 6  --  8  --   --   --   --   --   --   ALKPHOS 59  --  71  --   --   --   --   --   --   BILITOT 0.3  --  0.6  --   --   --   --   --   --   PROT 6.0*  --  6.2*  --   --   --   --   --   --  ALBUMIN 2.2*  2.2*   < > 2.1*  2.2*   < > 1.7* 2.1* 1.8* 1.9* 2.2*   < > = values in this interval not displayed.   No results for input(s): LIPASE, AMYLASE in the last 168 hours. No results for input(s): AMMONIA in the last 168 hours.  ABG    Component Value Date/Time   PHART 7.329 (L) 08/21/2019 0335   PCO2ART 56.2 (H) 08/21/2019 0335   PO2ART 105.0 08/21/2019 0335   HCO3 29.8 (H) 08/21/2019 0335   TCO2 32 08/21/2019 0335   ACIDBASEDEF 2.0 08/14/2019 0515   O2SAT 98.0 08/21/2019 0335     Coagulation Profile: No results for input(s): INR, PROTIME in the last 168 hours.  Cardiac Enzymes: No results for input(s): CKTOTAL, CKMB, CKMBINDEX, TROPONINI in the last 168 hours.  HbA1C: Hgb A1c MFr Bld  Date/Time Value Ref Range Status  07/26/2019 05:00 AM 6.1 (H) 4.8 - 5.6 % Final     Comment:    (NOTE) Pre diabetes:          5.7%-6.4% Diabetes:              >6.4% Glycemic control for   <7.0% adults with diabetes     CBG: Recent Labs  Lab 08/20/19 1131 08/20/19 2001 08/21/19 0016 08/21/19 0345 08/21/19 0746  GLUCAP 147* 153* 147* 127* 143*   The patient is critically ill with multiple organ systems failure and requires high complexity decision making for assessment and support, frequent evaluation and titration of therapies, application of advanced monitoring technologies and extensive interpretation of multiple databases.   Critical Care Time devoted to patient care services described in this note is  45  Minutes. This time reflects time of care of this signee Dr Jennet Maduro. This critical care time does not reflect procedure time, or teaching time or supervisory time of PA/NP/Med student/Med Resident etc but could involve care discussion time.  Rush Farmer, M.D. [facility] Pulmonary/Critical Care Medicine.

## 2019-08-22 ENCOUNTER — Inpatient Hospital Stay (HOSPITAL_COMMUNITY): Payer: Medicare HMO

## 2019-08-22 DIAGNOSIS — R579 Shock, unspecified: Secondary | ICD-10-CM | POA: Diagnosis not present

## 2019-08-22 DIAGNOSIS — Z930 Tracheostomy status: Secondary | ICD-10-CM | POA: Diagnosis not present

## 2019-08-22 DIAGNOSIS — U071 COVID-19: Secondary | ICD-10-CM | POA: Diagnosis not present

## 2019-08-22 DIAGNOSIS — R509 Fever, unspecified: Secondary | ICD-10-CM | POA: Diagnosis not present

## 2019-08-22 DIAGNOSIS — E8770 Fluid overload, unspecified: Secondary | ICD-10-CM | POA: Diagnosis not present

## 2019-08-22 DIAGNOSIS — J80 Acute respiratory distress syndrome: Secondary | ICD-10-CM | POA: Diagnosis not present

## 2019-08-22 DIAGNOSIS — J129 Viral pneumonia, unspecified: Secondary | ICD-10-CM | POA: Diagnosis not present

## 2019-08-22 DIAGNOSIS — E872 Acidosis: Secondary | ICD-10-CM | POA: Diagnosis not present

## 2019-08-22 DIAGNOSIS — J1289 Other viral pneumonia: Secondary | ICD-10-CM | POA: Diagnosis not present

## 2019-08-22 DIAGNOSIS — Z9689 Presence of other specified functional implants: Secondary | ICD-10-CM | POA: Diagnosis not present

## 2019-08-22 DIAGNOSIS — N179 Acute kidney failure, unspecified: Secondary | ICD-10-CM | POA: Diagnosis not present

## 2019-08-22 DIAGNOSIS — K250 Acute gastric ulcer with hemorrhage: Secondary | ICD-10-CM | POA: Diagnosis not present

## 2019-08-22 DIAGNOSIS — J9601 Acute respiratory failure with hypoxia: Secondary | ICD-10-CM | POA: Diagnosis not present

## 2019-08-22 DIAGNOSIS — D649 Anemia, unspecified: Secondary | ICD-10-CM | POA: Diagnosis not present

## 2019-08-22 DIAGNOSIS — J9600 Acute respiratory failure, unspecified whether with hypoxia or hypercapnia: Secondary | ICD-10-CM | POA: Diagnosis not present

## 2019-08-22 DIAGNOSIS — Z4682 Encounter for fitting and adjustment of non-vascular catheter: Secondary | ICD-10-CM | POA: Diagnosis not present

## 2019-08-22 DIAGNOSIS — A4901 Methicillin susceptible Staphylococcus aureus infection, unspecified site: Secondary | ICD-10-CM | POA: Diagnosis not present

## 2019-08-22 DIAGNOSIS — I959 Hypotension, unspecified: Secondary | ICD-10-CM | POA: Diagnosis not present

## 2019-08-22 DIAGNOSIS — D62 Acute posthemorrhagic anemia: Secondary | ICD-10-CM | POA: Diagnosis not present

## 2019-08-22 LAB — CBC WITH DIFFERENTIAL/PLATELET
Abs Immature Granulocytes: 0.79 10*3/uL — ABNORMAL HIGH (ref 0.00–0.07)
Basophils Absolute: 0.1 10*3/uL (ref 0.0–0.1)
Basophils Relative: 1 %
Eosinophils Absolute: 0.3 10*3/uL (ref 0.0–0.5)
Eosinophils Relative: 3 %
HCT: 27 % — ABNORMAL LOW (ref 39.0–52.0)
Hemoglobin: 8.3 g/dL — ABNORMAL LOW (ref 13.0–17.0)
Immature Granulocytes: 9 %
Lymphocytes Relative: 9 %
Lymphs Abs: 0.7 10*3/uL (ref 0.7–4.0)
MCH: 30 pg (ref 26.0–34.0)
MCHC: 30.7 g/dL (ref 30.0–36.0)
MCV: 97.5 fL (ref 80.0–100.0)
Monocytes Absolute: 0.4 10*3/uL (ref 0.1–1.0)
Monocytes Relative: 4 %
Neutro Abs: 6.3 10*3/uL (ref 1.7–7.7)
Neutrophils Relative %: 74 %
Platelets: 175 10*3/uL (ref 150–400)
RBC: 2.77 MIL/uL — ABNORMAL LOW (ref 4.22–5.81)
RDW: 16.3 % — ABNORMAL HIGH (ref 11.5–15.5)
WBC: 8.5 10*3/uL (ref 4.0–10.5)
nRBC: 2.2 % — ABNORMAL HIGH (ref 0.0–0.2)

## 2019-08-22 LAB — RENAL FUNCTION PANEL
Albumin: 2.1 g/dL — ABNORMAL LOW (ref 3.5–5.0)
Albumin: 2.1 g/dL — ABNORMAL LOW (ref 3.5–5.0)
Anion gap: 6 (ref 5–15)
Anion gap: 10 (ref 5–15)
BUN: 48 mg/dL — ABNORMAL HIGH (ref 8–23)
BUN: 56 mg/dL — ABNORMAL HIGH (ref 8–23)
CO2: 28 mmol/L (ref 22–32)
CO2: 25 mmol/L (ref 22–32)
Calcium: 8.4 mg/dL — ABNORMAL LOW (ref 8.9–10.3)
Calcium: 8.8 mg/dL — ABNORMAL LOW (ref 8.9–10.3)
Chloride: 103 mmol/L (ref 98–111)
Chloride: 103 mmol/L (ref 98–111)
Creatinine, Ser: 2.37 mg/dL — ABNORMAL HIGH (ref 0.61–1.24)
Creatinine, Ser: 2.92 mg/dL — ABNORMAL HIGH (ref 0.61–1.24)
GFR calc Af Amer: 32 mL/min — ABNORMAL LOW (ref >60)
GFR calc Af Amer: 25 mL/min — ABNORMAL LOW (ref >60)
GFR calc non Af Amer: 28 mL/min — ABNORMAL LOW (ref >60)
GFR calc non Af Amer: 21 mL/min — ABNORMAL LOW (ref >60)
Glucose, Bld: 140 mg/dL — ABNORMAL HIGH (ref 70–99)
Glucose, Bld: 107 mg/dL — ABNORMAL HIGH (ref 70–99)
Phosphorus: 3.9 mg/dL (ref 2.5–4.6)
Phosphorus: 7.1 mg/dL — ABNORMAL HIGH (ref 2.5–4.6)
Potassium: 5 mmol/L (ref 3.5–5.1)
Potassium: 5.9 mmol/L — ABNORMAL HIGH (ref 3.5–5.1)
Sodium: 137 mmol/L (ref 135–145)
Sodium: 138 mmol/L (ref 135–145)

## 2019-08-22 LAB — POCT I-STAT 7, (LYTES, BLD GAS, ICA,H+H)
Acid-Base Excess: 1 mmol/L (ref 0.0–2.0)
Bicarbonate: 27.7 mmol/L (ref 20.0–28.0)
Calcium, Ion: 1.31 mmol/L (ref 1.15–1.40)
HCT: 24 % — ABNORMAL LOW (ref 39.0–52.0)
Hemoglobin: 8.2 g/dL — ABNORMAL LOW (ref 13.0–17.0)
O2 Saturation: 93 %
Patient temperature: 37
Potassium: 5 mmol/L (ref 3.5–5.1)
Sodium: 137 mmol/L (ref 135–145)
TCO2: 29 mmol/L (ref 22–32)
pCO2 arterial: 54.3 mmHg — ABNORMAL HIGH (ref 32.0–48.0)
pH, Arterial: 7.315 — ABNORMAL LOW (ref 7.350–7.450)
pO2, Arterial: 76 mmHg — ABNORMAL LOW (ref 83.0–108.0)

## 2019-08-22 LAB — GLUCOSE, CAPILLARY
Glucose-Capillary: 108 mg/dL — ABNORMAL HIGH (ref 70–99)
Glucose-Capillary: 111 mg/dL — ABNORMAL HIGH (ref 70–99)
Glucose-Capillary: 113 mg/dL — ABNORMAL HIGH (ref 70–99)
Glucose-Capillary: 114 mg/dL — ABNORMAL HIGH (ref 70–99)
Glucose-Capillary: 123 mg/dL — ABNORMAL HIGH (ref 70–99)
Glucose-Capillary: 126 mg/dL — ABNORMAL HIGH (ref 70–99)
Glucose-Capillary: 84 mg/dL (ref 70–99)

## 2019-08-22 LAB — APTT
aPTT: 63 seconds — ABNORMAL HIGH (ref 24–36)
aPTT: 52 seconds — ABNORMAL HIGH (ref 24–36)

## 2019-08-22 LAB — PROTIME-INR
INR: 1 (ref 0.8–1.2)
Prothrombin Time: 13 seconds (ref 11.4–15.2)

## 2019-08-22 LAB — MAGNESIUM: Magnesium: 2.5 mg/dL — ABNORMAL HIGH (ref 1.7–2.4)

## 2019-08-22 LAB — PROCALCITONIN: Procalcitonin: 4.38 ng/mL

## 2019-08-22 MED ORDER — MIDAZOLAM HCL 2 MG/2ML IJ SOLN
5.0000 mg | Freq: Once | INTRAMUSCULAR | Status: AC
  Administered 2019-08-22: 16:00:00 2 mg via INTRAVENOUS
  Filled 2019-08-22: qty 6

## 2019-08-22 MED ORDER — PROPOFOL 10 MG/ML IV BOLUS
500.0000 mg | Freq: Once | INTRAVENOUS | Status: DC
  Filled 2019-08-22: qty 60

## 2019-08-22 MED ORDER — ETOMIDATE 2 MG/ML IV SOLN
40.0000 mg | Freq: Once | INTRAVENOUS | Status: AC
  Administered 2019-08-22: 16:00:00 20 mg via INTRAVENOUS
  Filled 2019-08-22: qty 20

## 2019-08-22 MED ORDER — NOREPINEPHRINE 4 MG/250ML-% IV SOLN
INTRAVENOUS | Status: AC
  Filled 2019-08-22: qty 250

## 2019-08-22 MED ORDER — VECURONIUM BROMIDE 10 MG IV SOLR
10.0000 mg | Freq: Once | INTRAVENOUS | Status: AC
  Administered 2019-08-22: 10 mg via INTRAVENOUS
  Filled 2019-08-22: qty 10

## 2019-08-22 MED ORDER — FENTANYL CITRATE (PF) 100 MCG/2ML IJ SOLN
200.0000 ug | Freq: Once | INTRAMUSCULAR | Status: AC
  Administered 2019-08-22: 100 ug via INTRAVENOUS
  Filled 2019-08-22: qty 4

## 2019-08-22 NOTE — Progress Notes (Signed)
Updated MD [name] about holding doxazosin for low trending BP, MD okay for holding med this AM. Tawanna Sat, RN 08/22/2019 1035

## 2019-08-22 NOTE — Progress Notes (Signed)
Assisted with transfer to OR via bed with RT and 2 RN's for trach placement with vent at 1535.  See MD note for procedure details.  Assisted with transfer back to room with above staff, trach in place, no complication noted during transfers or procedure.  Pt back in room by 1605, bath competed, linen changes.  Nigel Berthold Laporchia Nakajima, RN 08/22/2019 [phone]

## 2019-08-22 NOTE — Progress Notes (Signed)
Informed Mrs Lochlin Eppinger that pt had trach placed without complications, all questions answered.  Nigel Berthold Azara Gemme, RN 08/22/2019 [phone]

## 2019-08-22 NOTE — Progress Notes (Addendum)
[name] Progress Note     Assessment/ Plan:    Acute kidney injury: on CKD w/ baseline creat of 1.5. Admitted with Covid pneumonia complicated course with recent history of MSSA bacteremia. Developed shock hypotension and required pressors. No evidence of hydronephrosis on renal ultrasound. Suspected ATN.There was administration of contrast but this was early on the course of his illness on 07/25/2019.Formetabolic acidosis was started on CRRT 08/07/2019. No signs renal of recovery at this time. ? Citrate protocol caused systemic alkalosis so was dc'd ? Getting in-circuit IV heparin standing dose of 500 u/hr, check PTT daily for monitoring, and which for signs of re- GI bleed ? FiO2 40% ? I/O yest +673, no UOP, wt's stable ? keeping even w CRRT    Anemia / sp GIB - as per primary team transfuse as necessary. Started darbe/ esa at 60 ug weekly. Tsat is 33%.   COVID-19 pneumonia- Treated with remdesivir, dexamethasone and convalescent plasma  Afib / RVR - per CCM  GIB - acute, sp EGD 10/17 showed gastric ulcer/ w signs of recent bleeding rx'd w/ injection+ clipping.   VDRF/ R PTX w/ chest tube and air leak - will need trach possibly today  Shock - Off pressors today  MSSA bacteremia- treated  Acinetobacter and Pseudomonal pneumonia> RLL necrotic? plan per CCM is continue meropenem for 7 days, re-assess at day 7 as may need more   [name], MD 08/22/2019, 4:06 PM   Subjective:    Patient not examined directly given COVID-19 + status, utilizing exam of the primary team and observations of RN's.     Objective:   BP (!) 106/54   Pulse 92   Temp 99.9 F (37.7 C)   Resp (!) 24   Ht 5\' 10"  (1.778 m)   Wt 82.7 kg   SpO2 98%   BMI 26.16 kg/m   Intake/Output Summary (Last 24 hours) at 08/22/2019 1606 Last data filed at 08/22/2019 1500 Gross per 24 hour  Intake 2193.32 ml  Output 1845 ml  Net 348.32 ml   Weight change: -1.4 kg  Physical  Exam:  Patient not examined directly given COVID-19 + status, utilizing exam of the primary team and observations of RN's.   Labs: BMET Recent Labs  Lab 08/19/19 0500  08/19/19 1108 08/19/19 1600 08/20/19 0400  08/20/19 1643 08/21/19 0335 08/21/19 0455 08/21/19 0500 08/21/19 1558 08/22/19 0305 08/22/19 0415  NA 142   < > 141 140  --    < > 142 138 139 138 138 137 137  K 3.4*   < > 3.6 3.7  --    < > 4.6 4.6 4.7 4.7 5.0 5.0 5.0  CL 91*   < > 88* 90*  --   --  95*  --  100 102 104  --  103  CO2 37*  --   --  38*  --   --  34*  --  29 28 27   --  28  GLUCOSE 175*   < > 156* 183*  --   --  142*  --  155* 147* 150*  --  140*  BUN 52*   < > 45* 49*  --   --  115*  --  79* 74* 61*  --  48*  CREATININE 1.96*   < > 2.10* 2.01*  --   --  4.87*  --  3.10* 3.00* 2.79*  --  2.37*  CALCIUM 8.4*  --   --  8.4*  --   --  8.3*  --  8.5* 8.6* 8.5*  --  8.4*  PHOS 2.6  --   --  2.6 4.9*  --  6.8*  --  4.8* 5.0* 3.9  --  3.9   < > = values in this interval not displayed.   CBC Recent Labs  Lab 08/19/19 0500  08/20/19 0400  08/21/19 0335 08/21/19 0500 08/22/19 0305 08/22/19 0415  WBC 9.1  --  8.7  --   --  10.4  --  8.5  NEUTROABS 8.0*  --  7.2  --   --  8.3*  --  6.3  HGB 7.7*   < > 6.7*   < > 8.8* 9.0* 8.2* 8.3*  HCT 24.7*   < > 21.6*   < > 26.0* 29.4* 24.0* 27.0*  MCV 93.9  --  97.3  --   --  95.1  --  97.5  PLT 170  --  187  --   --  181  --  175   < > = values in this interval not displayed.    Medications:    . chlorhexidine  15 mL Mouth/Throat BID  . Chlorhexidine Gluconate Cloth  6 each Topical Daily  . darbepoetin (ARANESP) injection - NON-DIALYSIS  100 mcg Subcutaneous Q Wed-1800  . etomidate  40 mg Intravenous Once  . feeding supplement (PRO-STAT SUGAR FREE 64)  60 mL Per Tube TID  . fentaNYL (SUBLIMAZE) injection  200 mcg Intravenous Once  . heparin injection (subcutaneous)  7,500 Units Subcutaneous Q8H  . insulin aspart  0-9 Units Subcutaneous Q4H  . mouth rinse  15 mL  Mouth Rinse 10 times per day  . midazolam  5 mg Intravenous Once  . pantoprazole (PROTONIX) IV  40 mg Intravenous Q12H  . polyethylene glycol  17 g Per Tube QHS  . propofol  500 mg Intravenous Once  . sennosides  5 mL Per Tube QHS  . sucralfate  1 g Oral Q6H  . vecuronium  10 mg Intravenous Once

## 2019-08-22 NOTE — Progress Notes (Signed)
PROGRESS NOTE  [name] QVZ:[id number] DOB: Mar 01, 1951 DOA: 07/25/2019  PCP: Kathyrn Drown, MD  Brief History/Interval Summary: 68 year old with a history of GERD and BPH who presented to [facility], ED with S OB and was found to be hypoxic.  He was initially diagnosed with COVID-19 September 20.  1 to 2 days prior to his presentation he developed worsening shortness of breath with anorexia.  His wife's physician during a teleconference call noted that the patient himself is very tachypneic and ultimately EMS was sent to the house.  EMS found the patient to have saturations in the 40s.  In the ED on nonrebreather the patient sats only improved to the 80s.  The patient was severely confused and required emergent intubation.  Chest x-ray confirmed multifocal infiltrates.  CT chest was negative for PE.  Reason for Visit: Acute respiratory failure with hypoxia.  Pneumonia due to COVID-19.  Significant Events: 10/3 admit to [facility] from [facility] 10/13 right pneumothorax -pigtail chest tube placed 10/14 Nephrology consulted 10/15 begin CRRT -endobronchial blocker placed 10/16 melena 10/17 A. fib with RVR -EGD 10/18 transfuse PRBC 10/20 Heparin drip resumed 10/21 abrupt drop in hemoglobin -transfuse 1 unit PRBC 10/23 tPA per chest tube  10/25 endobronchial blocker removed  10/26 1U PRBC 10/28: 2 units of PRBC transfused  Consultants: Pulmonology.  Nephrology.  Gastroenterology.  Procedures:  EGD 10/17:   Impression:               - No active bleeding or blood or coffee ground                            material in the upper tract at the time of this                            exam.                           - Normal esophagus.                           - Excessive gastric fluid (residual tube feeding)                            without anatomic gastric outlet obstruction. Fluid                            aspiration performed. The stomach was somewhat    dilated and patulous, causing looping of the                            endoscope along the greater curve during                            advancement, making it difficult to traverse the                            pylorus, although this was eventually successfully                            accomplished.                           -  Non-bleeding gastric ulcer with pigmented                            material suggesting stigma of recent hemorrhage.                            Injected with good blanching. Clips were placed,                            effectively sealing off the ulcer.                           - Normal examined duodenum.   Antibiotics: Anti-infectives (From admission, onward)   Start     Dose/Rate Route Frequency Ordered Stop   08/21/19 1700  vancomycin (VANCOCIN) IVPB 750 mg/150 ml premix     750 mg 150 mL/hr over 60 Minutes Intravenous Every 24 hours 08/21/19 0843     08/21/19 1400  vancomycin (VANCOCIN) IVPB 750 mg/150 ml premix  Status:  Discontinued     750 mg 150 mL/hr over 60 Minutes Intravenous Every 24 hours 08/20/19 1146 08/20/19 1218   08/21/19 1000  meropenem (MERREM) 1 g in sodium chloride 0.9 % 100 mL IVPB     1 g 200 mL/hr over 30 Minutes Intravenous Every 12 hours 08/20/19 1535     08/21/19 0413  meropenem (MERREM) 500 mg in sodium chloride 0.9 % 100 mL IVPB  Status:  Discontinued     500 mg 200 mL/hr over 30 Minutes Intravenous Every 24 hours 08/20/19 1245 08/20/19 1535   08/20/19 1330  vancomycin (VANCOCIN) 1,750 mg in sodium chloride 0.9 % 500 mL IVPB     1,750 mg 250 mL/hr over 120 Minutes Intravenous  Once 08/20/19 1222 08/20/19 1619   08/20/19 1300  vancomycin (VANCOCIN) 1,250 mg in sodium chloride 0.9 % 250 mL IVPB  Status:  Discontinued     1,250 mg 166.7 mL/hr over 90 Minutes Intravenous  Once 08/20/19 1146 08/20/19 1218   08/20/19 1224  vancomycin variable dose per unstable renal function (pharmacist dosing)  Status:  Discontinued       Does not apply See admin instructions 08/20/19 1225 08/21/19 1152   08/15/19 1600  meropenem (MERREM) 1 g in sodium chloride 0.9 % 100 mL IVPB  Status:  Discontinued     1 g 200 mL/hr over 30 Minutes Intravenous Every 12 hours 08/15/19 1543 08/20/19 1245   08/15/19 1000  voriconazole (VFEND) tablet 200 mg  Status:  Discontinued     200 mg Per Tube Every 12 hours 08/14/19 1148 08/15/19 1134   08/14/19 1600  anidulafungin (ERAXIS) 100 mg in sodium chloride 0.9 % 100 mL IVPB  Status:  Discontinued     100 mg 78 mL/hr over 100 Minutes Intravenous Every 24 hours 08/13/19 1645 08/14/19 1148   08/14/19 1300  voriconazole (VFEND) tablet 400 mg     400 mg Per Tube Every 12 hours 08/14/19 1148 08/14/19 2217   08/13/19 1700  anidulafungin (ERAXIS) 200 mg in sodium chloride 0.9 % 200 mL IVPB     200 mg 78 mL/hr over 200 Minutes Intravenous  Once 08/13/19 1645 08/13/19 2030   08/13/19 0200  ceFAZolin (ANCEF) IVPB 2g/100 mL premix  Status:  Discontinued     2 g 200 mL/hr over 30 Minutes Intravenous Every  12 hours 08/11/19 1512 08/15/19 1543   08/12/19 0200  ceFEPIme (MAXIPIME) 2 g in sodium chloride 0.9 % 100 mL IVPB     2 g 200 mL/hr over 30 Minutes Intravenous Every 12 hours 08/11/19 1509 08/12/19 1341   08/07/19 2200  ceFEPIme (MAXIPIME) 2 g in sodium chloride 0.9 % 100 mL IVPB  Status:  Discontinued     2 g 200 mL/hr over 30 Minutes Intravenous Every 12 hours 08/07/19 1540 08/11/19 1509   08/07/19 1000  anidulafungin (ERAXIS) 100 mg in sodium chloride 0.9 % 100 mL IVPB  Status:  Discontinued     100 mg 78 mL/hr over 100 Minutes Intravenous Every 24 hours 08/06/19 0934 08/08/19 1203   08/07/19 0800  ceFEPIme (MAXIPIME) 2 g in sodium chloride 0.9 % 100 mL IVPB  Status:  Discontinued     2 g 200 mL/hr over 30 Minutes Intravenous Every 24 hours 08/06/19 1036 08/07/19 1540   08/06/19 1000  anidulafungin (ERAXIS) 200 mg in sodium chloride 0.9 % 200 mL IVPB    Note to Pharmacy: please   200 mg 78  mL/hr over 200 Minutes Intravenous Every 24 hours 08/06/19 0925 08/06/19 1600   08/06/19 0800  ceFEPIme (MAXIPIME) 2 g in sodium chloride 0.9 % 100 mL IVPB  Status:  Discontinued     2 g 200 mL/hr over 30 Minutes Intravenous Every 12 hours 08/06/19 0733 08/06/19 1036   08/02/19 2200  ceFAZolin (ANCEF) IVPB 2g/100 mL premix  Status:  Discontinued     2 g 200 mL/hr over 30 Minutes Intravenous Every 8 hours 08/02/19 1338 08/06/19 0733   08/02/19 1400  vancomycin (VANCOCIN) 1,250 mg in sodium chloride 0.9 % 250 mL IVPB  Status:  Discontinued     1,250 mg 166.7 mL/hr over 90 Minutes Intravenous Every 24 hours 08/01/19 1320 08/02/19 1332   08/01/19 1330  vancomycin (VANCOCIN) 2,000 mg in sodium chloride 0.9 % 500 mL IVPB     2,000 mg 250 mL/hr over 120 Minutes Intravenous  Once 08/01/19 1158 08/01/19 1636   08/01/19 1300  ceFEPIme (MAXIPIME) 2 g in sodium chloride 0.9 % 100 mL IVPB  Status:  Discontinued     2 g 200 mL/hr over 30 Minutes Intravenous Every 12 hours 08/01/19 1158 08/02/19 1332   07/26/19 1600  remdesivir 100 mg in sodium chloride 0.9 % 250 mL IVPB     100 mg 500 mL/hr over 30 Minutes Intravenous Every 24 hours 07/26/19 0132 07/29/19 1823   07/26/19 0215  remdesivir 200 mg in sodium chloride 0.9 % 250 mL IVPB     200 mg 500 mL/hr over 30 Minutes Intravenous Once 07/26/19 0132 07/26/19 0520      Subjective/Interval History: Patient remains intubated and sedated.    Assessment/Plan:  Acute Hypoxic Resp. Failure/Pneumonia due to COVID-19  Vent Mode: PSV;CPAP FiO2 (%):  [40 %] 40 % Set Rate:  [16 bmp] 16 bmp Vt Set:  [550 mL] 550 mL PEEP:  [5 cmH20-8 cmH20] 8 cmH20 Pressure Support:  [5 cmH20] 5 cmH20 Plateau Pressure:  [15 cmH20-26 cmH20] 21 cmH20     Component Value Date/Time   PHART 7.315 (L) 08/22/2019 0305   PCO2ART 54.3 (H) 08/22/2019 0305   PO2ART 76.0 (L) 08/22/2019 0305   HCO3 27.7 08/22/2019 0305   TCO2 29 08/22/2019 0305   ACIDBASEDEF 2.0 08/14/2019  0515   O2SAT 93.0 08/22/2019 0305    Lab Results  Component Value Date   SARSCOV2NAA Detected (A) 07/22/2019  COVID-19 specific Treatment: Decadron 10/2 > 10/12 Remdesivir 10/2 > 10/6 Actemra 10/2 Convalescent plasma 10/3  Patient remains on mechanical ventilation.  Pulmonology is following and managing.  Patient also has other pulmonary issues as discussed below.  Patient has been weaned off of Levophed.  Discussed with pulmonology.  Plan is for tracheostomy.  From a COVID-19 standpoint he has completed course of remdesivir and steroids.  He has received Actemra and convalescent plasma as well.  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  Bedside bronchoscopy revealed material in the airway.  TPA infused via chest tube on 10/23.  Chest tube management per pulmonology.  Endobronchial blocker removed on 10/25.    Fever Patient was noted to have fever on 10/27 on 10/28.  WBC was noted to be normal.  Blood cultures were repeated.  Sputum cultures were repeated.  Patient was on antibiotics including meropenem for Pseudomonas in BAL specimen as discussed below.  Patient was started on vancomycin.  Fever appears to have subsided.  Procalcitonin was elevated at 5.15.  Noted to be 4.38 today.  WBC remains normal.  Blood cultures from 10/28 - so far.  He said respiratory cultures from 10/28 without any growth so far.  Pseudomonas and Acinetobacter in BAL specimen Patient was started on meropenem on 10/23 with initial plan for a 7-day treatment.  However due to recent fever we will extend this treatment to 10 days.  Acute renal failure Patient on CRRT. CRRT was held on 10/27 due to alkalosis from citrate protocol.  There was also an issue of inability to use heparin due to GI bleed.  There was also some mention of potentially transitioning to intermittent hemodialysis.   However CRRT was restarted with low-dose heparin.  Further management per nephrology.  Acute GI bleed secondary to gastric ulcer Patient seen by gastroenterology.  Underwent EGD with clipping of vessel and large ulcer.  Anticoagulation was initially held and resumed on 10/20.  However there was an abrupt drop in hemoglobin subsequently.  There was a suspicion that he was having experiencing slow intermittent GI bleed.  Patient again transfused on 10/28.  Hemoglobin responded appropriately but seems to be drifting down again.  Will discuss with nursing staff if he is having black stools.   Continue PPI.  Twice a day.  H. pylori antibody was noted to be elevated.  Patient will need 2 weeks treatment for H. pylori when he is ready for discharge.  Plus he will need follow-up with [facility] gastroenterology.  Acute blood loss anemia Likely due to GI bleed.  Patient has been transfused with 5 units of PRBC so far during this hospitalization.  Last transfused with 2 units on 10/28.  Hemoglobin did respond appropriately but again noted to be lower today.     Elevated D-dimer Noted to be significantly elevated on 10/19 greater than 20.  Unable to fully anticoagulate due to GI bleed.  No evidence for DVT on Dopplers on 10/8.  Thought to be related to suspected empyema in the right lung.  D-dimer improved to 8.83 on 10/23.  At this time do not anticipate any further work-up as there are many reasons why the patient could have elevated D-dimer.  Paroxysmal atrial fibrillation with RVR Patient without any prior history of atrial fibrillation.  Atrial fibrillation likely occurred in the setting of acute illness.  Currently sinus rhythm.  Unable to fully anticoagulate due to  GI bleeding.  MSSA bacteremia/septic shock 2 out of 2 blood cultures positive on 10/9.  Patient completed course of antibiotics for this indication.  Shock resolved.  Repeat cultures pending due to recurrence of fever.  History of BPH Monitor for  urinary retention  FEN On CRRT.  Monitor electrolytes.  On tube feedings.  Pressure injuries Pressure Injury 08/16/19 Lip Medial;Lower Stage I -  Intact skin with non-blanchable redness of a localized area usually over a bony prominence. healing  (Active)  08/16/19 2000  Location: Lip  Location Orientation: Medial;Lower  Staging: Stage I -  Intact skin with non-blanchable redness of a localized area usually over a bony prominence.  Wound Description (Comments): healing   Present on Admission: No     Pressure Injury 08/16/19 Lip Lower;Mid Stage I -  Intact skin with non-blanchable redness of a localized area usually over a bony prominence. healing  (Active)  08/16/19 2342  Location: Lip  Location Orientation: Lower;Mid  Staging: Stage I -  Intact skin with non-blanchable redness of a localized area usually over a bony prominence.  Wound Description (Comments): healing   Present on Admission:     DVT Prophylaxis: Subcutaneous heparin Code Status: DNR Family Communication: PCCM discussed with patient's family Disposition Plan: Remain in ICU for now   Medications:  Scheduled: . chlorhexidine  15 mL Mouth/Throat BID  . Chlorhexidine Gluconate Cloth  6 each Topical Daily  . darbepoetin (ARANESP) injection - NON-DIALYSIS  100 mcg Subcutaneous Q Wed-1800  . doxazosin  2 mg Per Tube Daily  . feeding supplement (PRO-STAT SUGAR FREE 64)  60 mL Per Tube TID  . heparin injection (subcutaneous)  7,500 Units Subcutaneous Q8H  . insulin aspart  0-9 Units Subcutaneous Q4H  . mouth rinse  15 mL Mouth Rinse 10 times per day  . pantoprazole (PROTONIX) IV  40 mg Intravenous Q12H  . polyethylene glycol  17 g Per Tube QHS  . sennosides  5 mL Per Tube QHS  . sucralfate  1 g Oral Q6H   Continuous: .  prismasol BGK 4/2.5 400 mL/hr at 08/22/19 0500  .  prismasol BGK 4/2.5 200 mL/hr at 08/20/19 1629  . sodium chloride Stopped (08/21/19 1920)  . sodium chloride Stopped (08/21/19 1914)  . dextrose  Stopped (08/16/19 1623)  . feeding supplement (VITAL 1.5 CAL) 1,000 mL (08/22/19 0334)  . heparin 10,000 units/ 20 mL infusion syringe 500 Units/hr (08/21/19 1712)  . meropenem (MERREM) IV Stopped (08/21/19 2211)  . norepinephrine (LEVOPHED) Adult infusion Stopped (08/21/19 0654)  . prismasol BGK 4/2.5 1,800 mL/hr at 08/22/19 0749  . vancomycin Stopped (08/21/19 1814)   HAL:PFXTKW chloride, sodium chloride, acetaminophen (TYLENOL) oral liquid 160 mg/5 mL, alteplase, dextrose, heparin, heparin, metoprolol tartrate, midazolam, pneumococcal 23 valent vaccine, sodium chloride   Objective:  Vital Signs  Vitals:   08/22/19 0700 08/22/19 0730 08/22/19 0735 08/22/19 0800  BP: (!) 99/54 (!) 112/57  (!) 101/58  Pulse: 76 86  79  Resp: (!) 22 (!) 25  (!) 39  Temp: 97.7 F (36.5 C) (!) 97.5 F (36.4 C)  (!) 97.3 F (36.3 C)  TempSrc:      SpO2: 100% 100% 100% 100%  Weight:      Height:        Intake/Output Summary (Last 24 hours) at 08/22/2019 0852 Last data filed at 08/22/2019 0800 Gross per 24 hour  Intake 2605.54 ml  Output 1951 ml  Net 654.54 ml   Filed Weights   08/21/19 0500  08/22/19 0000 08/22/19 0500  Weight: 84.1 kg 82.7 kg 82.7 kg    General appearance: Intubated and sedated Resp: Coarse breath sounds bilaterally.  Few crackles at the bases.  No wheezing or rhonchi. Cardio: S1-S2 is normal regular.  No S3-S4.  No rubs murmurs or bruit GI: Abdomen is soft.  Nontender nondistended.  Bowel sounds are present normal.  No masses organomegaly Extremities: Edema bilateral lower extremities Neurologic: Intubated and sedated.    Lab Results:  Data Reviewed: I have personally reviewed following labs and imaging studies  CBC: Recent Labs  Lab 08/18/19 0355  08/19/19 0500  08/20/19 0400 08/20/19 0514 08/21/19 0335 08/21/19 0500 08/22/19 0305 08/22/19 0415  WBC 6.7  --  9.1  --  8.7  --   --  10.4  --  8.5  NEUTROABS 6.1  --  8.0*  --  7.2  --   --  8.3*  --  6.3   HGB 6.8*   < > 7.7*   < > 6.7* 7.8* 8.8* 9.0* 8.2* 8.3*  HCT 21.4*   < > 24.7*   < > 21.6* 23.0* 26.0* 29.4* 24.0* 27.0*  MCV 96.0  --  93.9  --  97.3  --   --  95.1  --  97.5  PLT 142*  --  170  --  187  --   --  181  --  175   < > = values in this interval not displayed.    Basic Metabolic Panel: Recent Labs  Lab 08/20/19 1643  08/21/19 0455 08/21/19 0500 08/21/19 1558 08/22/19 0305 08/22/19 0415  NA 142   < > 139 138 138 137 137  K 4.6   < > 4.7 4.7 5.0 5.0 5.0  CL 95*  --  100 102 104  --  103  CO2 34*  --  _0 --  28  GLUCOSE 142*  --  155* 147* 150*  --  140*  BUN 115*  --  79* 74* 61*  --  48*  CREATININE 4.87*  --  3.10* 3.00* 2.79*  --  2.37*  CALCIUM 8.3*  --  8.5* 8.6* 8.5*  --  8.4*  MG  --   --   --  2.4  --   --  2.5*  PHOS 6.8*  --  4.8* 5.0* 3.9  --  3.9   < > = values in this interval not displayed.    GFR: Estimated Creatinine Clearance: 31.7 mL/min (A) (by C-G formula based on SCr of 2.37 mg/dL (H)).  Liver Function Tests: Recent Labs  Lab 08/16/19 0430  08/19/19 1600 08/20/19 1643 08/21/19 0455 08/21/19 1558 08/22/19 0415  AST 38  --   --   --   --   --   --   ALT 8  --   --   --   --   --   --   ALKPHOS 71  --   --   --   --   --   --   BILITOT 0.6  --   --   --   --   --   --   PROT 6.2*  --   --   --   --   --   --   ALBUMIN 2.1*  2.2*   < > 1.8* 1.9* 2.2* 2.2* 2.1*   < > = values in this interval not displayed.     CBG: Recent Labs  Lab 08/21/19 1538 08/21/19 1949 08/22/19 0024 08/22/19 0407 08/22/19 0742  GLUCAP 144* 136* 123* 84 114*      Recent Results (from the past 240 hour(s))  Culture, respiratory (non-expectorated)     Status: None   Collection Time: 08/13/19 11:30 AM   Specimen: Bronchoalveolar Lavage; Respiratory  Result Value Ref Range Status   Specimen Description   Final    BRONCHIAL ALVEOLAR LAVAGE Performed at [facility], [address], [place], [place] [address]    Special Requests    Final    Normal Performed at [place] [address] [place], [place] [address]    Gram Stain   Final    MODERATE WBC PRESENT,BOTH PMN AND MONONUCLEAR NO ORGANISMS SEEN Performed at [facility], [place] [address], [place], [place] [address]    Culture   Final    FEW PSEUDOMONAS AERUGINOSA FEW ACINETOBACTER CALCOACETICUS/BAUMANNII COMPLEX    Report Status 08/15/2019 FINAL  Final   Organism ID, Bacteria PSEUDOMONAS AERUGINOSA  Final   Organism ID, Bacteria ACINETOBACTER CALCOACETICUS/BAUMANNII COMPLEX  Final      Susceptibility   Acinetobacter calcoaceticus/baumannii complex - MIC*    CEFTAZIDIME 8 SENSITIVE Sensitive     CEFTRIAXONE 16 INTERMEDIATE Intermediate     CIPROFLOXACIN 0.5 SENSITIVE Sensitive     GENTAMICIN 4 SENSITIVE Sensitive     IMIPENEM <=0.25 SENSITIVE Sensitive     PIP/TAZO 16 SENSITIVE Sensitive     TRIMETH/SULFA <=20 SENSITIVE Sensitive     CEFEPIME 16 INTERMEDIATE Intermediate     AMPICILLIN/SULBACTAM <=2 SENSITIVE Sensitive     * FEW ACINETOBACTER CALCOACETICUS/BAUMANNII COMPLEX   Pseudomonas aeruginosa - MIC*    CEFTAZIDIME 4 SENSITIVE Sensitive     CIPROFLOXACIN <=0.25 SENSITIVE Sensitive     GENTAMICIN <=1 SENSITIVE Sensitive     IMIPENEM 1 SENSITIVE Sensitive     PIP/TAZO 8 SENSITIVE Sensitive     CEFEPIME <=1 SENSITIVE Sensitive     * FEW PSEUDOMONAS AERUGINOSA  Fungus Culture With Stain     Status: None   Collection Time: 08/13/19 11:30 AM  Result Value Ref Range Status   Fungus Stain Final report  Final    Comment: (NOTE) Performed At: [facility] [place], [place] [id number] Rush Farmer MD [id number]    Fungus (Mycology) Culture PENDING  Incomplete   Fungal Source BRONCHIAL ALVEOLAR LAVAGE  Corrected    Comment: Performed at [facility], [place] [address], [place], [place] [address] CORRECTED ON 10/21 AT 1659: PREVIOUSLY REPORTED AS TRACHEAL ASPIRATE   Aspergillus Ag, BAL/Serum      Status: None   Collection Time: 08/13/19 11:30 AM  Result Value Ref Range Status   Aspergillus Ag, BAL/Serum 0.06 0.00 - 0.49 Index Final    Comment: (NOTE) Performed At: [facility] [address] [place], [place] [id number] Rush Farmer MD [id number]:[id number] Performed At: [facility] [place], [place] [id number] Katina Degree MDPhD [id number]   Fungus Culture Result     Status: None   Collection Time: 08/13/19 11:30 AM  Result Value Ref Range Status   Result 1 Comment  Final    Comment: (NOTE) KOH/Calcofluor preparation:  no fungus observed. Performed At: [facility] [place], [place] [id number] Rush Farmer MD [id number]   Expectorated sputum assessment w rflx to resp cult     Status: None   Collection Time: 08/16/19  9:25 AM   Specimen: Expectorated Sputum  Result  Value Ref Range Status   Specimen Description EXPECTORATED SPUTUM  Final   Special Requests Normal  Final   Sputum evaluation   Final    THIS SPECIMEN IS ACCEPTABLE FOR SPUTUM CULTURE Performed at [facility], [place] [address] [place], [place] [address]    Report Status 08/16/2019 FINAL  Final  Fungus Culture With Stain     Status: None (Preliminary result)   Collection Time: 08/16/19  9:25 AM  Result Value Ref Range Status   Fungus Stain Final report  Final    Comment: (NOTE) Performed At: [facility] [place], [place] [id number] Rush Farmer MD [id number]    Fungus (Mycology) Culture PENDING  Incomplete   Fungal Source SPUTUM  Final    Comment: Performed at [facility], [place] [address] [place], [place] [address]  Culture, respiratory     Status: None   Collection Time: 08/16/19  9:25 AM  Result Value Ref Range Status   Specimen Description   Final    EXPECTORATED SPUTUM Performed at [place] [address], [place], [place] [address]    Special Requests    Final    Normal Reflexed from [phone] Performed at [facility], [place] [address], [place], [place] [address]    Gram Stain   Final    ABUNDANT WBC PRESENT, PREDOMINANTLY PMN NO ORGANISMS SEEN Performed at [facility], [place] [address], [place], [place] [address]    Culture FEW PSEUDOMONAS AERUGINOSA  Final   Report Status 08/18/2019 FINAL  Final   Organism ID, Bacteria PSEUDOMONAS AERUGINOSA  Final      Susceptibility   Pseudomonas aeruginosa - MIC*    CEFTAZIDIME 4 SENSITIVE Sensitive     CIPROFLOXACIN <=0.25 SENSITIVE Sensitive     GENTAMICIN <=1 SENSITIVE Sensitive     IMIPENEM 1 SENSITIVE Sensitive     PIP/TAZO 8 SENSITIVE Sensitive     CEFEPIME 4 SENSITIVE Sensitive     * FEW PSEUDOMONAS AERUGINOSA  Fungus Culture Result     Status: None   Collection Time: 08/16/19  9:25 AM  Result Value Ref Range Status   Result 1 Comment  Final    Comment: (NOTE) KOH/Calcofluor preparation:  no fungus observed. Performed At: [facility] [address] [place], [place] [id number] Rush Farmer MD [id number]   Culture, blood (Routine X 2) w Reflex to ID Panel     Status: None (Preliminary result)   Collection Time: 08/20/19 12:35 PM   Specimen: BLOOD  Result Value Ref Range Status   Specimen Description   Final    BLOOD LEFT HAND Performed at [place] [address], [place], [place] [address]    Special Requests   Final    BOTTLES DRAWN AEROBIC AND ANAEROBIC Blood Culture adequate volume Performed at [place] [address] [place], [place] [address]    Culture   Final    NO GROWTH 2 DAYS Performed at [place] [address] [place], [place] [address]    Report Status PENDING  Incomplete  Culture, blood (Routine X 2) w Reflex to ID Panel     Status: None (Preliminary result)   Collection Time: 08/20/19 12:48 PM   Specimen: BLOOD  Result Value Ref Range Status   Specimen Description    Final    BLOOD LEFT ARM Performed at [place] [address], [place], [place] [address]  Special Requests   Final    BOTTLES DRAWN AEROBIC ONLY Blood Culture adequate volume Performed at [place] [address] [place], [place] [address]    Culture   Final    NO GROWTH 2 DAYS Performed at [place] [address], [place], [place] [address]    Report Status PENDING  Incomplete  Culture, respiratory     Status: None (Preliminary result)   Collection Time: 08/20/19 12:54 PM   Specimen: Tracheal Aspirate  Result Value Ref Range Status   Specimen Description   Final    TRACHEAL ASPIRATE Performed at [place] [address], [place], [place] [address]    Special Requests   Final    NONE Performed at [facility], [place] [address], [place], [place] [address]    Gram Stain   Final    ABUNDANT WBC PRESENT,BOTH PMN AND MONONUCLEAR NO ORGANISMS SEEN    Culture   Final    CULTURE REINCUBATED FOR BETTER GROWTH Performed at [facility], [place] [address], [place], [place] [address]    Report Status PENDING  Incomplete      Radiology Studies: Dg Chest Port 1 View  Result Date: 08/22/2019 CLINICAL DATA:  Intubated. EXAM: PORTABLE CHEST 1 VIEW COMPARISON:  08/21/2019 FINDINGS: Endotracheal tube in satisfactory position. Feeding tube extending into the distal stomach with its tip not included. Right subclavian catheter tip in the region of the superior cavoatrial junction or upper right atrium. No significant change in bilateral interstitial and patchy opacities and right basilar bullous changes. No definite pneumothorax. Minimal right pleural fluid. Normal sized heart. Thoracic spine degenerative changes. IMPRESSION: 1. No significant change in bilateral pneumonia and interstitial disease and right basilar bullous changes. 2. Minimal right pleural fluid. Electronically Signed   By:  Claudie Revering M.D.   On: 08/22/2019 07:12   Dg Chest Port 1 View  Result Date: 08/21/2019 CLINICAL DATA:  COVID-19, intubation EXAM: PORTABLE CHEST 1 VIEW COMPARISON:  Portable exam 0535 hours compared to 08/20/2019 FINDINGS: Tip of endotracheal tube projects 3.4 cm above carina. Feeding tube extends into stomach. LEFT jugular dual-lumen central venous catheter tip projects over confluence of the SVC and LEFT brachiocephalic vein. Tip of RIGHT jugular line projects over RIGHT atrium; recommend withdrawal 4 cm to place tip at cavoatrial junction. RIGHT thoracostomy tube unchanged. Stable heart size mediastinal contours. Patchy BILATERAL airspace infiltrates consistent with pneumonia. No pleural effusion. Tiny loculated pneumothorax at lateral RIGHT base. IMPRESSION: Tiny residual RIGHT basilar atelectasis despite thoracostomy tube. Persistent BILATERAL pulmonary infiltrates consistent with pneumonia. Electronically Signed   By: Lavonia Dana M.D.   On: 08/21/2019 08:44       LOS: 28 days   Coda Filler [employer] on [email or web address]  08/22/2019, 8:52 AM

## 2019-08-22 NOTE — Progress Notes (Signed)
NAME:  [name], MRN:  [id number], DOB:  23-Sep-1951, LOS: 15 ADMISSION DATE:  07/25/2019, CONSULTATION DATE:  10/3 REFERRING MD:  Nadara Mustard, CHIEF COMPLAINT:  Dyspnea   Brief History   68 yo male dx with COVID 07/22/19 presented to [facility] ER 07/25/19 with progressive dyspnea and hypoxia requiring intubation from COVID 19 pneumonia.  Past Medical History  GERD  Significant Hospital Events   10/03 Admit to [facility] from [facility] ER, start decadron and remdesivir, given tociluzimab; prone positioning 10/04 convalescent plasma 10/05 stop prone positioning 10/06 convalescent plasma; prone positioning again 10/09 start ABx 10/10 MSSA bacteremia, CVL d/ced; ID consulted; increase OG tube outpt 10/11 vent weaning trial started 10/13 fever, pneumothorax, pig tail chest tube placed 10/14 worsening hypotension and renal fx >> consulted nephrology; worsening PTX >> replaced chest tube; GNR in sputum >> ABx changed 10/15 start CRRT; endobronchial blocker placed for persistent air leak 10/16 endobronchial blocker repositioned, changed chest tube to water seal; melana with ABLA >> GI consulted; transfuse PRBC 10/17 persistent air leak, increased WOB; start nimbex gtt >> air leak decreased; EGD; A fib with RVR >> start amiodarone 10/18 transfuse PRBC; GI s/o 10/20 resume heparin gtt 10/21 stopped nimbex, chest tube to suction, stopped heparin drip because Hgb dropped again 10/22 TPA in chest tube, repositioned endobronchial blocker 10/23 deflated endobronchial balloon 10/24 small air leak  10/25 removed bronchial blocker  Consults:  ID Nephrology Gastroenterology PCCM  Procedures:  10/2 ETT>  10/2 R IJ CVL > 10/10 10/13 Rt pig tail chest tube >> 10/14 10/14 R 44F chest tube >  10/14 R subclavian CVL >  10/14 L IJ HD cath >  10/14 Lt radial a line >   Significant Diagnostic Tests:  10/2 CT angiogram chest > extensive bilateral airspace disease predominantly posterior 10/8 doppler legs b/l >  no DVT 10/15 renal u/s > normal 10/17 EGD > non bleeding gastric ulcer  10/19 Echo >> EF 60 to 65%, hyperdynamic LV with small LVOT gradient 10/22 CT chest > large collection of air in pleural space with fluid, pneumonia bilaterally R>L endobronchial blocker in place, chest tube anterior  Micro Data:  9/20 SARS COV 2 > POSITIVE 10/2 blood > negative 10/9 blood > MSSA 2/4 10/9 resp > MSSA, Pseudomonas 10/13 blood >> negative 10/21 bronch wash bacterial > pseduomonas, acinetobacter baumannii 10/21 bronch wash fungal >  10/21 bronch wash aspergillus antigen> negative 10/24 bronchoscopy wash>  10/28 Blood cx>>> 10/28 Sputum cx>>>  Antimicrobials:  10/3 remdesivir > 10/6 10/3 actemra 10/3 decadron > 10/12 10/4 convalescent plasma 10/6 convalescent plasma  10/9 vancomycin > 10/10 10/9 cefepime > 10/10 10/10 ancef > 10/13 10/13 cefepime > 10/20 10/14 anidulofungin > 10/15  10/20 ancef > 10/23 10/23 meropenem >   10/21 anidulofungin> 10/22 10/22 voriconazole > 10/23 Merrem and Vanc reordered 10/28>>>  Interim history/subjective:   Persistent airleak Continues to be hypotensive No further events overnight  Objective   Blood pressure (!) 101/58, pulse 79, temperature (!) 97.3 F (36.3 C), resp. rate (!) 39, height 5\' 10"  (1.778 m), weight 82.7 kg, SpO2 100 %. CVP:  [0 mmHg-14 mmHg] 6 mmHg  Vent Mode: PSV;CPAP FiO2 (%):  [40 %] 40 % Set Rate:  [16 bmp] 16 bmp Vt Set:  [550 mL] 550 mL PEEP:  [5 cmH20-8 cmH20] 8 cmH20 Pressure Support:  [5 cmH20] 5 cmH20 Plateau Pressure:  [15 cmH20-26 cmH20] 21 cmH20   Intake/Output Summary (Last 24 hours) at 08/22/2019 0900 Last data filed  at 08/22/2019 0800 Gross per 24 hour  Intake 2530.54 ml  Output 1926 ml  Net 604.54 ml   Filed Weights   08/21/19 0500 08/22/19 0000 08/22/19 0500  Weight: 84.1 kg 82.7 kg 82.7 kg   Examination:  General:  Acutely ill appearing, NAD HENT: [place]/AT, PERRL, EOM-I and MMM, ETT in place PULM:  Coarse BS diffusely CV: RRR, Nl S1/S2 and -M/R/G GI: Soft, NT, ND and +BS MSK: -edema and -tenderness Neuro: opens eyes to voice, doesn't move arms or legs  I reviewed CXR myself, ETT in place and CT in place, infiltrate noted  Discussed with TRH-MD  Resolved Hospital Problem list   Atrial fibrillation Septic shock  Assessment & Plan:  ARDS due to COVID-19 pneumonia: improved oxygenation Ventilatory synchrony best with pressure control, not changed much with sedation Continue to attempt minimizing sedation PS as tolerated Plan trach today the more aggressive weaning Continue volume negative as renal and hemodynamics allow Wean PEEP/FiO2 for SaO2 > 85% VAP prevention CXR and ABG in AM  R Bronchopleural fistula, persistent air leak 10/26, bronchial blocker was up for over a week, fistula didn't heal so removed blocker to reduce likelihood of repeated episodes of pneumonia Suspect that endobronchial valve would be associated with more pneumonia like endobronchial blocker Likely necrotic RLL, unclear if fistula will heal Empyema R Continue chest tube to wall suction as per advice from thoracic surgery Spoke with CVTS, not a VATS or valve candidate at this time Will attempt to get off the vent and allow for time to heal Monitor ABG/CXR Continue merrem and vanc F/U on cultures  Acute kidney injury: Metabolic alklalosis on ABG from 10/26 Change CRRT to negative (postive 3L in 2 days) Monitor BMET and UOP Replace electrolytes as needed  Need for sedation/mechanical ventilation RASS target 0 to -1 Neuromuscular weakness Intermittent tachypnea: have seen this with many COVID patients, doesn't seem to be affected by medications RASS target 0 to -1 Continue fentanyl patch, minimize intermittent sedation unless causing significantly high pressures on vent with dyssychrony as I would really like to evaluate mental status but this is likely a long way to recovery  Gastric ulcer:  PPI  Anemia, acute hemorrhagic: Hgb down again on 10/23, spurious? Subcutaneous heparin for DVT prophylaxis  MSSA bacteremia Acinetobacter and Pseudomonal pneumonia> RLL necrotic? Continue meropenem for 7 days, re-assess at day 7 as may need more   PCCM will continue to follow  Best practice:  Diet: tube feeding Pain/Anxiety/Delirium protocol (if indicated): as above VAP protocol (if indicated): yes DVT prophylaxis: prophylactic dosing heparin GI prophylaxis: pantoprazole Glucose control: SSI Mobility: bed rest Code Status: DNR Family Communication: I updated [name] today by phone Disposition: remain in ICU  Labs   CBC: Recent Labs  Lab 08/18/19 0355  08/19/19 0500  08/20/19 0400 08/20/19 0514 08/21/19 0335 08/21/19 0500 08/22/19 0305 08/22/19 0415  WBC 6.7  --  9.1  --  8.7  --   --  10.4  --  8.5  NEUTROABS 6.1  --  8.0*  --  7.2  --   --  8.3*  --  6.3  HGB 6.8*   < > 7.7*   < > 6.7* 7.8* 8.8* 9.0* 8.2* 8.3*  HCT 21.4*   < > 24.7*   < > 21.6* 23.0* 26.0* 29.4* 24.0* 27.0*  MCV 96.0  --  93.9  --  97.3  --   --  95.1  --  97.5  PLT 142*  --  170  --  187  --   --  181  --  175   < > = values in this interval not displayed.    Basic Metabolic Panel: Recent Labs  Lab 08/20/19 1643  08/21/19 0455 08/21/19 0500 08/21/19 1558 08/22/19 0305 08/22/19 0415  NA 142   < > 139 138 138 137 137  K 4.6   < > 4.7 4.7 5.0 5.0 5.0  CL 95*  --  100 102 104  --  103  CO2 34*  --  29 28 27   --  28  GLUCOSE 142*  --  155* 147* 150*  --  140*  BUN 115*  --  79* 74* 61*  --  48*  CREATININE 4.87*  --  3.10* 3.00* 2.79*  --  2.37*  CALCIUM 8.3*  --  8.5* 8.6* 8.5*  --  8.4*  MG  --   --   --  2.4  --   --  2.5*  PHOS 6.8*  --  4.8* 5.0* 3.9  --  3.9   < > = values in this interval not displayed.   GFR: Estimated Creatinine Clearance: 31.7 mL/min (A) (by C-G formula based on SCr of 2.37 mg/dL (H)). Recent Labs  Lab 08/19/19 0500 08/20/19 0400 08/21/19 0500 08/22/19  0415  PROCALCITON  --   --  5.15 4.38  WBC 9.1 8.7 10.4 8.5    Liver Function Tests: Recent Labs  Lab 08/16/19 0430  08/19/19 1600 08/20/19 1643 08/21/19 0455 08/21/19 1558 08/22/19 0415  AST 38  --   --   --   --   --   --   ALT 8  --   --   --   --   --   --   ALKPHOS 71  --   --   --   --   --   --   BILITOT 0.6  --   --   --   --   --   --   PROT 6.2*  --   --   --   --   --   --   ALBUMIN 2.1*  2.2*   < > 1.8* 1.9* 2.2* 2.2* 2.1*   < > = values in this interval not displayed.   No results for input(s): LIPASE, AMYLASE in the last 168 hours. No results for input(s): AMMONIA in the last 168 hours.  ABG    Component Value Date/Time   PHART 7.315 (L) 08/22/2019 0305   PCO2ART 54.3 (H) 08/22/2019 0305   PO2ART 76.0 (L) 08/22/2019 0305   HCO3 27.7 08/22/2019 0305   TCO2 29 08/22/2019 0305   ACIDBASEDEF 2.0 08/14/2019 0515   O2SAT 93.0 08/22/2019 0305     Coagulation Profile: No results for input(s): INR, PROTIME in the last 168 hours.  Cardiac Enzymes: No results for input(s): CKTOTAL, CKMB, CKMBINDEX, TROPONINI in the last 168 hours.  HbA1C: Hgb A1c MFr Bld  Date/Time Value Ref Range Status  07/26/2019 05:00 AM 6.1 (H) 4.8 - 5.6 % Final    Comment:    (NOTE) Pre diabetes:          5.7%-6.4% Diabetes:              >6.4% Glycemic control for   <7.0% adults with diabetes     CBG: Recent Labs  Lab 08/21/19 1538 08/21/19 1949 08/22/19 0024 08/22/19 0407 08/22/19 0742  GLUCAP 144* 136* 123* 84 114*  The patient is critically ill with multiple organ systems failure and requires high complexity decision making for assessment and support, frequent evaluation and titration of therapies, application of advanced monitoring technologies and extensive interpretation of multiple databases.   Critical Care Time devoted to patient care services described in this note is  32  Minutes. This time reflects time of care of this signee Dr Jennet Maduro. This critical  care time does not reflect procedure time, or teaching time or supervisory time of PA/NP/Med student/Med Resident etc but could involve care discussion time.  Rush Farmer, M.D. [facility] Pulmonary/Critical Care Medicine.

## 2019-08-22 NOTE — Progress Notes (Signed)
Spoke with [name] on phone after verifying privacy code, updated [name] on pt condition and answered all questions. Albert Berthold Tuesday Terlecki, RN 08/22/2019 [phone]

## 2019-08-22 NOTE — Progress Notes (Signed)
Updated [name] that pt will go for trach today, all questions answered.  Albert Berthold Miqueas Whilden, RN 08/22/2019 1210

## 2019-08-22 NOTE — Procedures (Signed)
Percutaneous Tracheostomy Placement  Consent from family.  Patient sedated, paralyzed and position.  Placed on 100% FiO2 and RR matched.  Area cleaned and draped.  Lidocaine/epi injected.  Skin incision done followed by blunt dissection.  Trachea palpated then punctured, catheter passed and visualized bronchoscopically.  Wire placed and visualized.  Catheter removed.  Airway then entered and dilated.  Size 8 cuffed shiley trach placed and visualized bronchoscopically well above carina.  Good volume returns.  Patient tolerated the procedure well without complications.  Minimal blood loss.  CXR ordered and pending.  Rush Farmer, M.D. [facility] Pulmonary/Critical Care Medicine. Pager: [phone]. After hours pager: [phone].

## 2019-08-22 NOTE — Procedures (Signed)
Bronchoscopy Procedure Note [name] [id number] 17-Sep-1951  Procedure: Bronchoscopy Indications: Diagnostic evaluation of the airways and for percutaneous tracheostomy placement  Procedure Details Consent: Risks of procedure as well as the alternatives and risks of each were explained to the (patient/caregiver).  Consent for procedure obtained. Time Out: Verified patient identification, verified procedure, site/side was marked, verified correct patient position, special equipment/implants available, medications/allergies/relevent history reviewed, required imaging and test results available.  Performed  In preparation for procedure, patient was given 100% FiO2 and bronchoscope lubricated. Sedation: Benzodiazepines, Etomidate and fentanyl and vecuronim  Airway entered and was examined to the level of the carina Procedures performed: see separate tracheostomy placement note.  Bronchoscope removed.  , Patient placed back on 100% FiO2 at conclusion of procedure.   Evaluation Hemodynamic Status: Transient hypotension treated with fluid; O2 sats: stable throughout Patient's Current Condition: stable Specimens:  None Complications: No apparent complications Patient did tolerate procedure well.   [name] 08/22/2019

## 2019-08-22 NOTE — Progress Notes (Signed)
MD Nelda Marseille stated to D/C tube feeds, stop CRRT, draw PT/PTT/INR for trach placement this PM. Tawanna Sat, RN 08/22/2019 1120

## 2019-08-23 ENCOUNTER — Inpatient Hospital Stay (HOSPITAL_COMMUNITY): Payer: Medicare HMO

## 2019-08-23 DIAGNOSIS — R579 Shock, unspecified: Secondary | ICD-10-CM | POA: Diagnosis not present

## 2019-08-23 DIAGNOSIS — E8770 Fluid overload, unspecified: Secondary | ICD-10-CM | POA: Diagnosis not present

## 2019-08-23 DIAGNOSIS — N179 Acute kidney failure, unspecified: Secondary | ICD-10-CM | POA: Diagnosis not present

## 2019-08-23 DIAGNOSIS — D649 Anemia, unspecified: Secondary | ICD-10-CM | POA: Diagnosis not present

## 2019-08-23 DIAGNOSIS — R918 Other nonspecific abnormal finding of lung field: Secondary | ICD-10-CM | POA: Diagnosis not present

## 2019-08-23 DIAGNOSIS — D62 Acute posthemorrhagic anemia: Secondary | ICD-10-CM | POA: Diagnosis not present

## 2019-08-23 DIAGNOSIS — R509 Fever, unspecified: Secondary | ICD-10-CM | POA: Diagnosis not present

## 2019-08-23 DIAGNOSIS — E872 Acidosis: Secondary | ICD-10-CM | POA: Diagnosis not present

## 2019-08-23 DIAGNOSIS — A4901 Methicillin susceptible Staphylococcus aureus infection, unspecified site: Secondary | ICD-10-CM | POA: Diagnosis not present

## 2019-08-23 DIAGNOSIS — Z9689 Presence of other specified functional implants: Secondary | ICD-10-CM | POA: Diagnosis not present

## 2019-08-23 DIAGNOSIS — Z4682 Encounter for fitting and adjustment of non-vascular catheter: Secondary | ICD-10-CM | POA: Diagnosis not present

## 2019-08-23 DIAGNOSIS — J1289 Other viral pneumonia: Secondary | ICD-10-CM | POA: Diagnosis not present

## 2019-08-23 DIAGNOSIS — Z930 Tracheostomy status: Secondary | ICD-10-CM | POA: Diagnosis not present

## 2019-08-23 DIAGNOSIS — J9600 Acute respiratory failure, unspecified whether with hypoxia or hypercapnia: Secondary | ICD-10-CM | POA: Diagnosis not present

## 2019-08-23 DIAGNOSIS — I959 Hypotension, unspecified: Secondary | ICD-10-CM | POA: Diagnosis not present

## 2019-08-23 DIAGNOSIS — J9601 Acute respiratory failure with hypoxia: Secondary | ICD-10-CM | POA: Diagnosis not present

## 2019-08-23 DIAGNOSIS — J80 Acute respiratory distress syndrome: Secondary | ICD-10-CM | POA: Diagnosis not present

## 2019-08-23 DIAGNOSIS — K250 Acute gastric ulcer with hemorrhage: Secondary | ICD-10-CM | POA: Diagnosis not present

## 2019-08-23 DIAGNOSIS — U071 COVID-19: Secondary | ICD-10-CM | POA: Diagnosis not present

## 2019-08-23 LAB — CBC WITH DIFFERENTIAL/PLATELET
Abs Immature Granulocytes: 0.84 10*3/uL — ABNORMAL HIGH (ref 0.00–0.07)
Basophils Absolute: 0.1 10*3/uL (ref 0.0–0.1)
Basophils Relative: 1 %
Eosinophils Absolute: 0.3 10*3/uL (ref 0.0–0.5)
Eosinophils Relative: 3 %
HCT: 27.1 % — ABNORMAL LOW (ref 39.0–52.0)
Hemoglobin: 8.3 g/dL — ABNORMAL LOW (ref 13.0–17.0)
Immature Granulocytes: 11 %
Lymphocytes Relative: 9 %
Lymphs Abs: 0.7 10*3/uL (ref 0.7–4.0)
MCH: 29.5 pg (ref 26.0–34.0)
MCHC: 30.6 g/dL (ref 30.0–36.0)
MCV: 96.4 fL (ref 80.0–100.0)
Monocytes Absolute: 0.3 10*3/uL (ref 0.1–1.0)
Monocytes Relative: 4 %
Neutro Abs: 5.7 10*3/uL (ref 1.7–7.7)
Neutrophils Relative %: 72 %
Platelets: 179 10*3/uL (ref 150–400)
RBC: 2.81 MIL/uL — ABNORMAL LOW (ref 4.22–5.81)
RDW: 15.9 % — ABNORMAL HIGH (ref 11.5–15.5)
WBC: 7.8 10*3/uL (ref 4.0–10.5)
nRBC: 2 % — ABNORMAL HIGH (ref 0.0–0.2)

## 2019-08-23 LAB — RENAL FUNCTION PANEL
Albumin: 2.3 g/dL — ABNORMAL LOW (ref 3.5–5.0)
Albumin: 2.4 g/dL — ABNORMAL LOW (ref 3.5–5.0)
Anion gap: 8 (ref 5–15)
Anion gap: 7 (ref 5–15)
BUN: 45 mg/dL — ABNORMAL HIGH (ref 8–23)
BUN: 41 mg/dL — ABNORMAL HIGH (ref 8–23)
CO2: 26 mmol/L (ref 22–32)
CO2: 25 mmol/L (ref 22–32)
Calcium: 8.5 mg/dL — ABNORMAL LOW (ref 8.9–10.3)
Calcium: 8.5 mg/dL — ABNORMAL LOW (ref 8.9–10.3)
Chloride: 102 mmol/L (ref 98–111)
Chloride: 104 mmol/L (ref 98–111)
Creatinine, Ser: 2.01 mg/dL — ABNORMAL HIGH (ref 0.61–1.24)
Creatinine, Ser: 2.02 mg/dL — ABNORMAL HIGH (ref 0.61–1.24)
GFR calc Af Amer: 39 mL/min — ABNORMAL LOW (ref >60)
GFR calc Af Amer: 39 mL/min — ABNORMAL LOW (ref >60)
GFR calc non Af Amer: 34 mL/min — ABNORMAL LOW (ref >60)
GFR calc non Af Amer: 33 mL/min — ABNORMAL LOW (ref >60)
Glucose, Bld: 124 mg/dL — ABNORMAL HIGH (ref 70–99)
Glucose, Bld: 103 mg/dL — ABNORMAL HIGH (ref 70–99)
Phosphorus: 3.5 mg/dL (ref 2.5–4.6)
Phosphorus: 3.3 mg/dL (ref 2.5–4.6)
Potassium: 4.6 mmol/L (ref 3.5–5.1)
Potassium: 4.5 mmol/L (ref 3.5–5.1)
Sodium: 136 mmol/L (ref 135–145)
Sodium: 136 mmol/L (ref 135–145)

## 2019-08-23 LAB — POCT I-STAT 7, (LYTES, BLD GAS, ICA,H+H)
Acid-Base Excess: 1 mmol/L (ref 0.0–2.0)
Bicarbonate: 25.7 mmol/L (ref 20.0–28.0)
Calcium, Ion: 1.25 mmol/L (ref 1.15–1.40)
HCT: 24 % — ABNORMAL LOW (ref 39.0–52.0)
Hemoglobin: 8.2 g/dL — ABNORMAL LOW (ref 13.0–17.0)
O2 Saturation: 93 %
Patient temperature: 97.3
Potassium: 4.6 mmol/L (ref 3.5–5.1)
Sodium: 136 mmol/L (ref 135–145)
TCO2: 27 mmol/L (ref 22–32)
pCO2 arterial: 41.1 mmHg (ref 32.0–48.0)
pH, Arterial: 7.401 (ref 7.350–7.450)
pO2, Arterial: 66 mmHg — ABNORMAL LOW (ref 83.0–108.0)

## 2019-08-23 LAB — CULTURE, RESPIRATORY W GRAM STAIN

## 2019-08-23 LAB — GLUCOSE, CAPILLARY
Glucose-Capillary: 127 mg/dL — ABNORMAL HIGH (ref 70–99)
Glucose-Capillary: 121 mg/dL — ABNORMAL HIGH (ref 70–99)
Glucose-Capillary: 115 mg/dL — ABNORMAL HIGH (ref 70–99)
Glucose-Capillary: 104 mg/dL — ABNORMAL HIGH (ref 70–99)
Glucose-Capillary: 141 mg/dL — ABNORMAL HIGH (ref 70–99)

## 2019-08-23 LAB — APTT: aPTT: 59 seconds — ABNORMAL HIGH (ref 24–36)

## 2019-08-23 LAB — MAGNESIUM: Magnesium: 2.5 mg/dL — ABNORMAL HIGH (ref 1.7–2.4)

## 2019-08-23 MED ORDER — CEFAZOLIN SODIUM-DEXTROSE 2-4 GM/100ML-% IV SOLN
2.0000 g | Freq: Two times a day (BID) | INTRAVENOUS | Status: DC
  Administered 2019-08-23 – 2019-08-24 (×3): 2 g via INTRAVENOUS
  Filled 2019-08-23 (×3): qty 100

## 2019-08-23 MED ORDER — FENTANYL CITRATE (PF) 100 MCG/2ML IJ SOLN: 25.0000 ug | INTRAMUSCULAR | Status: DC | PRN

## 2019-08-23 NOTE — Progress Notes (Signed)
[facility] Progress Note     Assessment/ Plan:    Acute kidney injury: on CKD w/ baseline creat of 1.5. Admitted with Covid pneumonia complicated course with recent history of MSSA bacteremia. Developed shock hypotension and required pressors. No evidence of hydronephrosis on renal ultrasound. Suspected ATN.There was administration of contrast but this was early on the course of his illness on 07/25/2019.Formetabolic acidosis was started on CRRT 08/07/2019. No signs renal of recovery at this time. ? Citrate protocol caused systemic alkalosis so was dc'd ? Getting in-circuit IV heparin standing dose of 500 u/hr, checking PTT which has been stable 50- 60 range ? FiO2 40% ? I/O yest -310, no UOP, wt's down ? keeping even w CRRT    Anemia / sp GIB - transfuse as necessary. Started darbe/ esa at 60 ug weekly. Tsat is 33%.   COVID-19 pneumonia- Treated with remdesivir, dexamethasone and convalescent plasma  Afib / RVR - per CCM  GIB - acute, sp EGD 10/17 showed gastric ulcer/ w signs of recent bleeding rx'd w/ injection+ clipping.   VDRF/ R PTX w/ chest tube and air leak - will need trach possibly today  Shock - Off pressors today  MSSA bacteremia- treated  Acinetobacter and Pseudomonal pneumonia> RLL necrotic? plan per CCM is continue meropenem for 7 days, re-assess at day 7 as may need more   [name], MD 08/23/2019, 10:46 AM   Subjective:    Patient not examined directly given COVID-19 + status, utilizing exam of the primary team and observations of RN's.     Objective:   BP 100/65   Pulse 74   Temp (!) 96 F (35.6 C) (Axillary)   Resp (!) 26   Ht 5\' 10"  (1.778 m)   Wt 77.5 kg   SpO2 100%   BMI 24.52 kg/m   Intake/Output Summary (Last 24 hours) at 08/23/2019 1046 Last data filed at 08/23/2019 1000 Gross per 24 hour  Intake 2175.17 ml  Output 2321 ml  Net -145.83 ml   Weight change: -5.2 kg  Physical Exam:  Patient not examined  directly given COVID-19 + status, utilizing exam of the primary team and observations of RN's.   Labs: BMET Recent Labs  Lab 08/20/19 1643  08/21/19 0455 08/21/19 0500 08/21/19 1558 08/22/19 0305 08/22/19 0415 08/22/19 1750 08/23/19 0415 08/23/19 0453  NA 142   < > 139 138 138 137 137 138 136 136  K 4.6   < > 4.7 4.7 5.0 5.0 5.0 5.9* 4.6 4.6  CL 95*  --  100 102 104  --  103 103 102  --   CO2 34*  --  29 28 27   --  28 25 26   --   GLUCOSE 142*  --  155* 147* 150*  --  140* 107* 124*  --   BUN 115*  --  79* 74* 61*  --  48* 56* 45*  --   CREATININE 4.87*  --  3.10* 3.00* 2.79*  --  2.37* 2.92* 2.01*  --   CALCIUM 8.3*  --  8.5* 8.6* 8.5*  --  8.4* 8.8* 8.5*  --   PHOS 6.8*  --  4.8* 5.0* 3.9  --  3.9 7.1* 3.5  --    < > = values in this interval not displayed.   CBC Recent Labs  Lab 08/20/19 0400  08/21/19 0500 08/22/19 0305 08/22/19 0415 08/23/19 0415 08/23/19 0453  WBC 8.7  --  10.4  --  8.5  7.8  --   NEUTROABS 7.2  --  8.3*  --  6.3 5.7  --   HGB 6.7*   < > 9.0* 8.2* 8.3* 8.3* 8.2*  HCT 21.6*   < > 29.4* 24.0* 27.0* 27.1* 24.0*  MCV 97.3  --  95.1  --  97.5 96.4  --   PLT 187  --  181  --  175 179  --    < > = values in this interval not displayed.    Medications:    . chlorhexidine  15 mL Mouth/Throat BID  . Chlorhexidine Gluconate Cloth  6 each Topical Daily  . darbepoetin (ARANESP) injection - NON-DIALYSIS  100 mcg Subcutaneous Q Wed-1800  . feeding supplement (PRO-STAT SUGAR FREE 64)  60 mL Per Tube TID  . heparin injection (subcutaneous)  7,500 Units Subcutaneous Q8H  . insulin aspart  0-9 Units Subcutaneous Q4H  . mouth rinse  15 mL Mouth Rinse 10 times per day  . pantoprazole (PROTONIX) IV  40 mg Intravenous Q12H  . polyethylene glycol  17 g Per Tube QHS  . propofol  500 mg Intravenous Once  . sennosides  5 mL Per Tube QHS  . sucralfate  1 g Oral Q6H

## 2019-08-23 NOTE — Progress Notes (Signed)
Spoke to MD regarding K+ of 5.9. No orders given at this time. Will follow up with am labs.

## 2019-08-23 NOTE — Progress Notes (Signed)
Called pt's wife to update of pt condition and plan of care. Pt's wife appreciative of update.

## 2019-08-23 NOTE — Progress Notes (Signed)
NAME:  [name], MRN:  [id number], DOB:  October 21, 1951, LOS: 10 ADMISSION DATE:  07/25/2019, CONSULTATION DATE:  10/3 REFERRING MD:  Nadara Mustard, CHIEF COMPLAINT:  Dyspnea   Brief History   68 yo male dx with COVID 07/22/19 presented to [facility] ER 07/25/19 with progressive dyspnea and hypoxia requiring intubation from COVID 19 pneumonia.  Past Medical History  GERD  Significant Hospital Events   10/03 Admit to [facility] from [facility] ER, start decadron and remdesivir, given tociluzimab; prone positioning 10/04 convalescent plasma 10/05 stop prone positioning 10/06 convalescent plasma; prone positioning again 10/09 start ABx 10/10 MSSA bacteremia, CVL d/ced; ID consulted; increase OG tube outpt 10/11 vent weaning trial started 10/13 fever, pneumothorax, pig tail chest tube placed 10/14 worsening hypotension and renal fx >> consulted nephrology; worsening PTX >> replaced chest tube; GNR in sputum >> ABx changed 10/15 start CRRT; endobronchial blocker placed for persistent air leak 10/16 endobronchial blocker repositioned, changed chest tube to water seal; melana with ABLA >> GI consulted; transfuse PRBC 10/17 persistent air leak, increased WOB; start nimbex gtt >> air leak decreased; EGD; A fib with RVR >> start amiodarone 10/18 transfuse PRBC; GI s/o 10/20 resume heparin gtt 10/21 stopped nimbex, chest tube to suction, stopped heparin drip because Hgb dropped again 10/22 TPA in chest tube, repositioned endobronchial blocker 10/23 deflated endobronchial balloon 10/24 small air leak  10/25 removed bronchial blocker  Consults:  ID Nephrology Gastroenterology PCCM  Procedures:  10/2 ETT>  10/2 R IJ CVL > 10/10 10/13 Rt pig tail chest tube >> 10/14 10/14 R 50F chest tube >  10/14 R subclavian CVL >  10/14 L IJ HD cath >  10/14 Lt radial a line >   Significant Diagnostic Tests:  10/2 CT angiogram chest > extensive bilateral airspace disease predominantly posterior 10/8 doppler legs b/l >  no DVT 10/15 renal u/s > normal 10/17 EGD > non bleeding gastric ulcer  10/19 Echo >> EF 60 to 65%, hyperdynamic LV with small LVOT gradient 10/22 CT chest > large collection of air in pleural space with fluid, pneumonia bilaterally R>L endobronchial blocker in place, chest tube anterior  Micro Data:  9/20 SARS COV 2 > POSITIVE 10/2 blood > negative 10/9 blood > MSSA 2/4 10/9 resp > MSSA, Pseudomonas 10/13 blood >> negative 10/21 bronch wash bacterial > pseduomonas, acinetobacter baumannii 10/21 bronch wash fungal >  10/21 bronch wash aspergillus antigen> negative 10/24 bronchoscopy wash>  10/28 Blood cx>>> 10/28 Sputum cx>>>  Antimicrobials:  10/3 remdesivir > 10/6 10/3 actemra 10/3 decadron > 10/12 10/4 convalescent plasma 10/6 convalescent plasma  10/9 vancomycin > 10/10 10/9 cefepime > 10/10 10/10 ancef > 10/13 10/13 cefepime > 10/20 10/14 anidulofungin > 10/15  10/20 ancef > 10/23 10/23 meropenem >   10/21 anidulofungin> 10/22 10/22 voriconazole > 10/23 Merrem and Vanc reordered 10/28>>>  Interim history/subjective:   Persistent airleak No events overnight No new complaints  Objective   Blood pressure 101/64, pulse 75, temperature (!) 96 F (35.6 C), temperature source Axillary, resp. rate (!) 21, height 5\' 10"  (1.778 m), weight 77.5 kg, SpO2 100 %. CVP:  [5 mmHg] 5 mmHg  Vent Mode: CPAP;PSV FiO2 (%):  [40 %] 40 % Set Rate:  [16 bmp-28 bmp] 16 bmp Vt Set:  [550 mL] 550 mL PEEP:  [5 cmH20] 5 cmH20 Pressure Support:  [5 cmH20-8 cmH20] 8 cmH20 Plateau Pressure:  [16 cmH20-24 cmH20] 16 cmH20   Intake/Output Summary (Last 24 hours) at 08/23/2019 0845 Last data filed  at 08/23/2019 0800 Gross per 24 hour  Intake 2145.17 ml  Output 2475 ml  Net -329.83 ml   Filed Weights   08/22/19 0000 08/22/19 0500 08/23/19 0441  Weight: 82.7 kg 82.7 kg 77.5 kg   Examination:  General:  Ill appearing male, NAD HENT: /AT, PERRL, EOM-spontaneous, trach in place  PULM: Coarse diffusely CV: RRR, Nl S1/S2 and -M/R/G GI: Soft, NT, ND and +BS MSK: -edema and -tenderness Neuro: opens eyes to voice, doesn't move arms or legs  I reviewed CXR myself, trach is in a good position  Discussed with TRH-MD  Resolved Hospital Problem list   Atrial fibrillation Septic shock  Assessment & Plan:  ARDS due to COVID-19 pneumonia: improved oxygenation Ventilatory synchrony best with pressure control, not changed much with sedation Continue PS trials and attempt TC if able Volume even at this point Wean PEEP/FiO2 for SaO2 > 85% VAP prevention CXR and ABG in AM  R Bronchopleural fistula, persistent air leak 10/26, bronchial blocker was up for over a week, fistula didn't heal so removed blocker to reduce likelihood of repeated episodes of pneumonia Suspect that endobronchial valve would be associated with more pneumonia like endobronchial blocker Likely necrotic RLL, unclear if fistula will heal Empyema R CT to suction, no valve Spoke with CVTS, not a VATS or valve candidate at this time Monitor ABG/CXR Continue merrem and vanc F/U on cultures  Acute kidney injury: Metabolic alklalosis on ABG from 10/26 CRRT per renal, ?iHD time when off pressors Monitor BMET and UOP Replace electrolytes as needed  Need for sedation/mechanical ventilation RASS target 0 to -1 Neuromuscular weakness Intermittent tachypnea: have seen this with many COVID patients, doesn't seem to be affected by medications RASS target 0 to -1 Fentanyl patch, d/c continuous sedation and allow only for PRN fentanyl but no benzos  Gastric ulcer: PPI  Anemia, acute hemorrhagic: Hgb down again on 10/23, spurious? Subcutaneous heparin for DVT prophylaxis  MSSA bacteremia Acinetobacter and Pseudomonal pneumonia> RLL necrotic? Continue meropenem for 7 days, re-assess at day 7 as may need more   PCCM will continue to follow  Best practice:  Diet: tube feeding Pain/Anxiety/Delirium  protocol (if indicated): as above VAP protocol (if indicated): yes DVT prophylaxis: prophylactic dosing heparin GI prophylaxis: pantoprazole Glucose control: SSI Mobility: bed rest Code Status: DNR Family Communication: I updated [name] today by phone Disposition: remain in ICU  Labs   CBC: Recent Labs  Lab 08/19/19 0500  08/20/19 0400  08/21/19 0500 08/22/19 0305 08/22/19 0415 08/23/19 0415 08/23/19 0453  WBC 9.1  --  8.7  --  10.4  --  8.5 7.8  --   NEUTROABS 8.0*  --  7.2  --  8.3*  --  6.3 5.7  --   HGB 7.7*   < > 6.7*   < > 9.0* 8.2* 8.3* 8.3* 8.2*  HCT 24.7*   < > 21.6*   < > 29.4* 24.0* 27.0* 27.1* 24.0*  MCV 93.9  --  97.3  --  95.1  --  97.5 96.4  --   PLT 170  --  187  --  181  --  175 179  --    < > = values in this interval not displayed.    Basic Metabolic Panel: Recent Labs  Lab 08/21/19 0500 08/21/19 1558 08/22/19 0305 08/22/19 0415 08/22/19 1750 08/23/19 0415 08/23/19 0453  NA 138 138 137 137 138 136 136  K 4.7 5.0 5.0 5.0 5.9* 4.6 4.6  CL 102  104  --  103 103 102  --   CO2 28 27  --  28 25 26   --   GLUCOSE 147* 150*  --  140* 107* 124*  --   BUN 74* 61*  --  48* 56* 45*  --   CREATININE 3.00* 2.79*  --  2.37* 2.92* 2.01*  --   CALCIUM 8.6* 8.5*  --  8.4* 8.8* 8.5*  --   MG 2.4  --   --  2.5*  --  2.5*  --   PHOS 5.0* 3.9  --  3.9 7.1* 3.5  --    GFR: Estimated Creatinine Clearance: 37.3 mL/min (A) (by C-G formula based on SCr of 2.01 mg/dL (H)). Recent Labs  Lab 08/20/19 0400 08/21/19 0500 08/22/19 0415 08/23/19 0415  PROCALCITON  --  5.15 4.38  --   WBC 8.7 10.4 8.5 7.8    Liver Function Tests: Recent Labs  Lab 08/21/19 0455 08/21/19 1558 08/22/19 0415 08/22/19 1750 08/23/19 0415  ALBUMIN 2.2* 2.2* 2.1* 2.1* 2.3*   No results for input(s): LIPASE, AMYLASE in the last 168 hours. No results for input(s): AMMONIA in the last 168 hours.  ABG    Component Value Date/Time   PHART 7.401 08/23/2019 0453   PCO2ART 41.1 08/23/2019  0453   PO2ART 66.0 (L) 08/23/2019 0453   HCO3 25.7 08/23/2019 0453   TCO2 27 08/23/2019 0453   ACIDBASEDEF 2.0 08/14/2019 0515   O2SAT 93.0 08/23/2019 0453   Coagulation Profile: Recent Labs  Lab 08/22/19 1121  INR 1.0   Cardiac Enzymes: No results for input(s): CKTOTAL, CKMB, CKMBINDEX, TROPONINI in the last 168 hours.  HbA1C: Hgb A1c MFr Bld  Date/Time Value Ref Range Status  07/26/2019 05:00 AM 6.1 (H) 4.8 - 5.6 % Final    Comment:    (NOTE) Pre diabetes:          5.7%-6.4% Diabetes:              >6.4% Glycemic control for   <7.0% adults with diabetes    CBG: Recent Labs  Lab 08/22/19 1645 08/22/19 2003 08/22/19 2321 08/23/19 0431 08/23/19 0753  GLUCAP 108* 111* 113* 127* 121*   The patient is critically ill with multiple organ systems failure and requires high complexity decision making for assessment and support, frequent evaluation and titration of therapies, application of advanced monitoring technologies and extensive interpretation of multiple databases.   Critical Care Time devoted to patient care services described in this note is  32  Minutes. This time reflects time of care of this signee Dr Jennet Maduro. This critical care time does not reflect procedure time, or teaching time or supervisory time of PA/NP/Med student/Med Resident etc but could involve care discussion time.  Rush Farmer, M.D. [facility] Pulmonary/Critical Care Medicine.

## 2019-08-23 NOTE — Progress Notes (Signed)
SLP  Note  Patient Details Name: [name] MRN: [id number] DOB: [birth year]         Order received for PMSV/BSE, pt has tube feeding per chart. Will conduct evaluations as soon as able. Thanks for this consult.   Luanna Salk, MS [facility] SLP Acute Rehab Services Pager [phone] Office [phone]    Macario Golds 08/23/2019, 2:32 PM

## 2019-08-23 NOTE — Progress Notes (Addendum)
PROGRESS NOTE  [name] AOZ:[id number] DOB: 1951-01-29 DOA: 07/25/2019  PCP: Kathyrn Drown, MD  Brief History/Interval Summary: 69 year old with a history of GERD and BPH who presented to [facility], ED with S OB and was found to be hypoxic.  He was initially diagnosed with COVID-19 September 20.  1 to 2 days prior to his presentation he developed worsening shortness of breath with anorexia.  His wife's physician during a teleconference call noted that the patient himself is very tachypneic and ultimately EMS was sent to the house.  EMS found the patient to have saturations in the 40s.  In the ED on nonrebreather the patient sats only improved to the 80s.  The patient was severely confused and required emergent intubation.  Chest x-ray confirmed multifocal infiltrates.  CT chest was negative for PE.  Reason for Visit: Acute respiratory failure with hypoxia.  Pneumonia due to COVID-19.  Significant Events: 10/3 admit to [facility] from [facility] ED 10/13 right pneumothorax -pigtail chest tube placed 10/14 Nephrology consulted 10/15 begin CRRT -endobronchial blocker placed 10/16 melena 10/17 A. fib with RVR -EGD 10/18 transfuse PRBC 10/20 Heparin drip resumed 10/21 abrupt drop in hemoglobin -transfuse 1 unit PRBC 10/23 tPA per chest tube  10/25 endobronchial blocker removed  10/26 1U PRBC 10/28: 2 units of PRBC transfused  Consultants: Pulmonology.  Nephrology.  Gastroenterology.  Procedures:  EGD 10/17:   Impression:               - No active bleeding or blood or coffee ground                            material in the upper tract at the time of this                            exam.                           - Normal esophagus.                           - Excessive gastric fluid (residual tube feeding)                            without anatomic gastric outlet obstruction. Fluid                            aspiration performed. The stomach was somewhat    dilated and patulous, causing looping of the                            endoscope along the greater curve during                            advancement, making it difficult to traverse the                            pylorus, although this was eventually successfully                            accomplished.                           -  Non-bleeding gastric ulcer with pigmented                            material suggesting stigma of recent hemorrhage.                            Injected with good blanching. Clips were placed,                            effectively sealing off the ulcer.                           - Normal examined duodenum.  Tracheostomy on 10/30  Antibiotics: Anti-infectives (From admission, onward)   Start     Dose/Rate Route Frequency Ordered Stop   08/21/19 1700  vancomycin (VANCOCIN) IVPB 750 mg/150 ml premix     750 mg 150 mL/hr over 60 Minutes Intravenous Every 24 hours 08/21/19 0843     08/21/19 1400  vancomycin (VANCOCIN) IVPB 750 mg/150 ml premix  Status:  Discontinued     750 mg 150 mL/hr over 60 Minutes Intravenous Every 24 hours 08/20/19 1146 08/20/19 1218   08/21/19 1000  meropenem (MERREM) 1 g in sodium chloride 0.9 % 100 mL IVPB     1 g 200 mL/hr over 30 Minutes Intravenous Every 12 hours 08/20/19 1535     08/21/19 0413  meropenem (MERREM) 500 mg in sodium chloride 0.9 % 100 mL IVPB  Status:  Discontinued     500 mg 200 mL/hr over 30 Minutes Intravenous Every 24 hours 08/20/19 1245 08/20/19 1535   08/20/19 1330  vancomycin (VANCOCIN) 1,750 mg in sodium chloride 0.9 % 500 mL IVPB     1,750 mg 250 mL/hr over 120 Minutes Intravenous  Once 08/20/19 1222 08/20/19 1619   08/20/19 1300  vancomycin (VANCOCIN) 1,250 mg in sodium chloride 0.9 % 250 mL IVPB  Status:  Discontinued     1,250 mg 166.7 mL/hr over 90 Minutes Intravenous  Once 08/20/19 1146 08/20/19 1218   08/20/19 1224  vancomycin variable dose per unstable renal function (pharmacist dosing)  Status:   Discontinued      Does not apply See admin instructions 08/20/19 1225 08/21/19 1152   08/15/19 1600  meropenem (MERREM) 1 g in sodium chloride 0.9 % 100 mL IVPB  Status:  Discontinued     1 g 200 mL/hr over 30 Minutes Intravenous Every 12 hours 08/15/19 1543 08/20/19 1245   08/15/19 1000  voriconazole (VFEND) tablet 200 mg  Status:  Discontinued     200 mg Per Tube Every 12 hours 08/14/19 1148 08/15/19 1134   08/14/19 1600  anidulafungin (ERAXIS) 100 mg in sodium chloride 0.9 % 100 mL IVPB  Status:  Discontinued     100 mg 78 mL/hr over 100 Minutes Intravenous Every 24 hours 08/13/19 1645 08/14/19 1148   08/14/19 1300  voriconazole (VFEND) tablet 400 mg     400 mg Per Tube Every 12 hours 08/14/19 1148 08/14/19 2217   08/13/19 1700  anidulafungin (ERAXIS) 200 mg in sodium chloride 0.9 % 200 mL IVPB     200 mg 78 mL/hr over 200 Minutes Intravenous  Once 08/13/19 1645 08/13/19 2030   08/13/19 0200  ceFAZolin (ANCEF) IVPB 2g/100 mL premix  Status:  Discontinued     2 g 200 mL/hr over 30  Minutes Intravenous Every 12 hours 08/11/19 1512 08/15/19 1543   08/12/19 0200  ceFEPIme (MAXIPIME) 2 g in sodium chloride 0.9 % 100 mL IVPB     2 g 200 mL/hr over 30 Minutes Intravenous Every 12 hours 08/11/19 1509 08/12/19 1341   08/07/19 2200  ceFEPIme (MAXIPIME) 2 g in sodium chloride 0.9 % 100 mL IVPB  Status:  Discontinued     2 g 200 mL/hr over 30 Minutes Intravenous Every 12 hours 08/07/19 1540 08/11/19 1509   08/07/19 1000  anidulafungin (ERAXIS) 100 mg in sodium chloride 0.9 % 100 mL IVPB  Status:  Discontinued     100 mg 78 mL/hr over 100 Minutes Intravenous Every 24 hours 08/06/19 0934 08/08/19 1203   08/07/19 0800  ceFEPIme (MAXIPIME) 2 g in sodium chloride 0.9 % 100 mL IVPB  Status:  Discontinued     2 g 200 mL/hr over 30 Minutes Intravenous Every 24 hours 08/06/19 1036 08/07/19 1540   08/06/19 1000  anidulafungin (ERAXIS) 200 mg in sodium chloride 0.9 % 200 mL IVPB    Note to Pharmacy: please    200 mg 78 mL/hr over 200 Minutes Intravenous Every 24 hours 08/06/19 0925 08/06/19 1600   08/06/19 0800  ceFEPIme (MAXIPIME) 2 g in sodium chloride 0.9 % 100 mL IVPB  Status:  Discontinued     2 g 200 mL/hr over 30 Minutes Intravenous Every 12 hours 08/06/19 0733 08/06/19 1036   08/02/19 2200  ceFAZolin (ANCEF) IVPB 2g/100 mL premix  Status:  Discontinued     2 g 200 mL/hr over 30 Minutes Intravenous Every 8 hours 08/02/19 1338 08/06/19 0733   08/02/19 1400  vancomycin (VANCOCIN) 1,250 mg in sodium chloride 0.9 % 250 mL IVPB  Status:  Discontinued     1,250 mg 166.7 mL/hr over 90 Minutes Intravenous Every 24 hours 08/01/19 1320 08/02/19 1332   08/01/19 1330  vancomycin (VANCOCIN) 2,000 mg in sodium chloride 0.9 % 500 mL IVPB     2,000 mg 250 mL/hr over 120 Minutes Intravenous  Once 08/01/19 1158 08/01/19 1636   08/01/19 1300  ceFEPIme (MAXIPIME) 2 g in sodium chloride 0.9 % 100 mL IVPB  Status:  Discontinued     2 g 200 mL/hr over 30 Minutes Intravenous Every 12 hours 08/01/19 1158 08/02/19 1332   07/26/19 1600  remdesivir 100 mg in sodium chloride 0.9 % 250 mL IVPB     100 mg 500 mL/hr over 30 Minutes Intravenous Every 24 hours 07/26/19 0132 07/29/19 1823   07/26/19 0215  remdesivir 200 mg in sodium chloride 0.9 % 250 mL IVPB     200 mg 500 mL/hr over 30 Minutes Intravenous Once 07/26/19 0132 07/26/19 0520      Subjective/Interval History: Patient noted to be awake.  On trach collar.  Follows certain commands.    Assessment/Plan:  Acute Hypoxic Resp. Failure/Pneumonia due to COVID-19  Vent Mode: CPAP;PSV FiO2 (%):  [40 %] 40 % Set Rate:  [16 bmp-28 bmp] 16 bmp Vt Set:  [550 mL] 550 mL PEEP:  [5 cmH20] 5 cmH20 Pressure Support:  [5 cmH20-8 cmH20] 8 cmH20 Plateau Pressure:  [16 cmH20-24 cmH20] 16 cmH20     Component Value Date/Time   PHART 7.401 08/23/2019 0453   PCO2ART 41.1 08/23/2019 0453   PO2ART 66.0 (L) 08/23/2019 0453   HCO3 25.7 08/23/2019 0453   TCO2 27  08/23/2019 0453   ACIDBASEDEF 2.0 08/14/2019 0515   O2SAT 93.0 08/23/2019 0453    Lab Results  Component Value Date   SARSCOV2NAA Detected (A) 07/22/2019   COVID-19 specific Treatment: Decadron 10/2 > 10/12 Remdesivir 10/2 > 10/6 Actemra 10/2 Convalescent plasma 10/3  Patient remains on mechanical ventilation.  He underwent tracheostomy placement yesterday.  Pulmonology is following and managing.  Patient on trach collar and seems to be doing well.   Patient has been weaned off of Levophed.  Patient has other pulmonary issues as discussed below.  From a COVID-19 standpoint he has completed course of remdesivir as well as steroids.  He also received Actemra and convalescent plasma.  PT and OT evaluation.  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  Bedside bronchoscopy revealed material in the airway.  TPA infused via chest tube on 10/23.  Chest tube management per pulmonology.  Endobronchial blocker removed on 10/25.    Fever Patient was noted to have fever on 10/27 on 10/28.  WBC was noted to be normal.  Blood cultures were repeated.  Sputum cultures were repeated.  Patient was on antibiotics including meropenem for Pseudomonas in BAL specimen as discussed below.  Patient was started on vancomycin.  Procalcitonin was elevated at 5.15.  Fever appears to have subsided.  Procalcitonin was elevated at 5.15.  And then improved to 4.38 yesterday. Blood cultures remain negative so far.  WBC is normal.  Respiratory cultures again shows Pseudomonas.  Patient has been transitioned from vancomycin to cefazolin.  Pseudomonas and Acinetobacter in BAL specimen Patient was started on meropenem on 10/23 with initial plan for a 7-day treatment.  Patient completed course.  Acute renal failure Patient on CRRT. CRRT was held on 10/27 due to alkalosis from citrate protocol.  There was  also an issue of inability to use heparin due to GI bleed.  There was also some mention of potentially transitioning to intermittent hemodialysis.  However CRRT was restarted with low-dose heparin.  Nephrology is following.  Now that he is off of pressors and seems to be stable from a respiratory standpoint could transition him to intermittent hemodialysis.  Nephrology will consider stopping CRRT later tonight or tomorrow in order to facilitate this process.  Acute GI bleed secondary to gastric ulcer Patient seen by gastroenterology.  Underwent EGD with clipping of vessel and large ulcer.  Anticoagulation was initially held and resumed on 10/20.  However there was an abrupt drop in hemoglobin subsequently.  There was a suspicion that he was having experiencing slow intermittent GI bleed.  Patient again transfused on 10/28.  Patient with brown stools.   Continue PPI.  Twice a day.  H. pylori antibody was noted to be elevated.  Patient will need 2 weeks treatment for H. pylori when he is ready for discharge.  Plus he will need follow-up with [facility] gastroenterology.  Acute blood loss anemia Likely due to GI bleed.  Patient has been transfused with 5 units of PRBC so far during this hospitalization.  Last transfused with 2 units on 10/28.  Hemoglobin did respond appropriately.  Noted to be low over the last 48 hours but stable.  No overt bleeding noted.  Elevated D-dimer Noted to be significantly elevated on 10/19 greater than 20.  Unable to fully anticoagulate due to GI bleed.  No evidence for DVT on Dopplers on 10/8.  Thought to be related to suspected empyema in the right lung.  D-dimer improved to 8.83 on 10/23.  At this time do not anticipate any further  work-up as there are many reasons why the patient could have elevated D-dimer.  Paroxysmal atrial fibrillation with RVR Patient without any prior history of atrial fibrillation.  Atrial fibrillation likely occurred in the setting of acute illness.   Currently sinus rhythm.  Unable to fully anticoagulate due to GI bleeding.  MSSA bacteremia/septic shock 2 out of 2 blood cultures positive on 10/9.  Patient had completed course of antibiotics for this indication.  Shock resolved.  Repeat blood cultures have been negative.  Patient placed back on cefazolin for a longer course.  History of BPH Monitor for urinary retention  FEN On CRRT.  Monitor electrolytes.  On tube feedings.  Pressure injuries Pressure Injury 08/16/19 Lip Medial;Lower Stage I -  Intact skin with non-blanchable redness of a localized area usually over a bony prominence. healing  (Active)  08/16/19 2000  Location: Lip  Location Orientation: Medial;Lower  Staging: Stage I -  Intact skin with non-blanchable redness of a localized area usually over a bony prominence.  Wound Description (Comments): healing   Present on Admission: No     Pressure Injury 08/16/19 Lip Lower;Mid Stage I -  Intact skin with non-blanchable redness of a localized area usually over a bony prominence. healing  (Active)  08/16/19 2342  Location: Lip  Location Orientation: Lower;Mid  Staging: Stage I -  Intact skin with non-blanchable redness of a localized area usually over a bony prominence.  Wound Description (Comments): healing   Present on Admission:     DVT Prophylaxis: Subcutaneous heparin Code Status: DNR Family Communication: PCCM discussed with patient's family Disposition Plan: Remain in ICU for now   Medications:  Scheduled: . chlorhexidine  15 mL Mouth/Throat BID  . Chlorhexidine Gluconate Cloth  6 each Topical Daily  . darbepoetin (ARANESP) injection - NON-DIALYSIS  100 mcg Subcutaneous Q Wed-1800  . feeding supplement (PRO-STAT SUGAR FREE 64)  60 mL Per Tube TID  . heparin injection (subcutaneous)  7,500 Units Subcutaneous Q8H  . insulin aspart  0-9 Units Subcutaneous Q4H  . mouth rinse  15 mL Mouth Rinse 10 times per day  . pantoprazole (PROTONIX) IV  40 mg Intravenous  Q12H  . polyethylene glycol  17 g Per Tube QHS  . propofol  500 mg Intravenous Once  . sennosides  5 mL Per Tube QHS  . sucralfate  1 g Oral Q6H   Continuous: .  prismasol BGK 4/2.5 400 mL/hr at 08/23/19 0713  .  prismasol BGK 4/2.5 200 mL/hr at 08/22/19 1828  . sodium chloride Stopped (08/21/19 1920)  . sodium chloride Stopped (08/21/19 1914)  . dextrose Stopped (08/16/19 1623)  . feeding supplement (VITAL 1.5 CAL) 60 mL/hr at 08/23/19 0400  . heparin 10,000 units/ 20 mL infusion syringe 500 Units/hr (08/22/19 1826)  . meropenem (MERREM) IV Stopped (08/22/19 2214)  . norepinephrine (LEVOPHED) Adult infusion Stopped (08/21/19 0654)  . prismasol BGK 4/2.5 1,800 mL/hr at 08/23/19 0317  . vancomycin Stopped (08/22/19 1852)   OTL:XBWIOM chloride, sodium chloride, acetaminophen (TYLENOL) oral liquid 160 mg/5 mL, alteplase, dextrose, heparin, heparin, metoprolol tartrate, midazolam, pneumococcal 23 valent vaccine, sodium chloride   Objective:  Vital Signs  Vitals:   08/23/19 0600 08/23/19 0630 08/23/19 0700 08/23/19 0800  BP: 111/68 109/64 134/61 101/64  Pulse: 76 77 82 75  Resp: 20 (!) 27 (!) 28 (!) 21  Temp:    (!) 96 F (35.6 C)  TempSrc:    Axillary  SpO2: 99% 100% 99% 100%  Weight:  Height:        Intake/Output Summary (Last 24 hours) at 08/23/2019 0853 Last data filed at 08/23/2019 0800 Gross per 24 hour  Intake 2145.17 ml  Output 2475 ml  Net -329.83 ml   Filed Weights   08/22/19 0000 08/22/19 0500 08/23/19 0441  Weight: 82.7 kg 82.7 kg 77.5 kg     General appearance: On trach collar.  Eyes open.  Does not track as yet. Resp: Coarse breath sounds bilaterally.  Few crackles at the bases.  No wheezing or rhonchi. Cardio: S1-S2 is normal regular.  No S3-S4.  No rubs murmurs or bruit GI: Abdomen is soft.  Nontender nondistended.  Bowel sounds are present normal.  No masses organomegaly Extremities: Minimal edema bilateral lower extremities Neurologic: Noted  to be awake alert.  Follows a few commands.    Lab Results:  Data Reviewed: I have personally reviewed following labs and imaging studies  CBC: Recent Labs  Lab 08/19/19 0500  08/20/19 0400  08/21/19 0500 08/22/19 0305 08/22/19 0415 08/23/19 0415 08/23/19 0453  WBC 9.1  --  8.7  --  10.4  --  8.5 7.8  --   NEUTROABS 8.0*  --  7.2  --  8.3*  --  6.3 5.7  --   HGB 7.7*   < > 6.7*   < > 9.0* 8.2* 8.3* 8.3* 8.2*  HCT 24.7*   < > 21.6*   < > 29.4* 24.0* 27.0* 27.1* 24.0*  MCV 93.9  --  97.3  --  95.1  --  97.5 96.4  --   PLT 170  --  187  --  181  --  175 179  --    < > = values in this interval not displayed.    Basic Metabolic Panel: Recent Labs  Lab 08/21/19 0500 08/21/19 1558 08/22/19 0305 08/22/19 0415 08/22/19 1750 08/23/19 0415 08/23/19 0453  NA 138 138 137 137 138 136 136  K 4.7 5.0 5.0 5.0 5.9* 4.6 4.6  CL 102 104  --  103 103 102  --   CO2 28 27  --  _0 --   GLUCOSE 147* 150*  --  140* 107* 124*  --   BUN 74* 61*  --  48* 56* 45*  --   CREATININE 3.00* 2.79*  --  2.37* 2.92* 2.01*  --   CALCIUM 8.6* 8.5*  --  8.4* 8.8* 8.5*  --   MG 2.4  --   --  2.5*  --  2.5*  --   PHOS 5.0* 3.9  --  3.9 7.1* 3.5  --     GFR: Estimated Creatinine Clearance: 37.3 mL/min (A) (by C-G formula based on SCr of 2.01 mg/dL (H)).  Liver Function Tests: Recent Labs  Lab 08/21/19 0455 08/21/19 1558 08/22/19 0415 08/22/19 1750 08/23/19 0415  ALBUMIN 2.2* 2.2* 2.1* 2.1* 2.3*     CBG: Recent Labs  Lab 08/22/19 1645 08/22/19 2003 08/22/19 2321 08/23/19 0431 08/23/19 0753  GLUCAP 108* 111* 113* 127* 121*      Recent Results (from the past 240 hour(s))  Culture, respiratory (non-expectorated)     Status: None   Collection Time: 08/13/19 11:30 AM   Specimen: Bronchoalveolar Lavage; Respiratory  Result Value Ref Range Status   Specimen Description   Final    BRONCHIAL ALVEOLAR LAVAGE Performed at [facility], [address] [place], [place]  [address]    Special Requests   Final  Normal Performed at [facility], [place] [address] [place], [place] [address]    Gram Stain   Final    MODERATE WBC PRESENT,BOTH PMN AND MONONUCLEAR NO ORGANISMS SEEN Performed at [facility], [place] [address] [place], [place] [address]    Culture   Final    FEW PSEUDOMONAS AERUGINOSA FEW ACINETOBACTER CALCOACETICUS/BAUMANNII COMPLEX    Report Status 08/15/2019 FINAL  Final   Organism ID, Bacteria PSEUDOMONAS AERUGINOSA  Final   Organism ID, Bacteria ACINETOBACTER CALCOACETICUS/BAUMANNII COMPLEX  Final      Susceptibility   Acinetobacter calcoaceticus/baumannii complex - MIC*    CEFTAZIDIME 8 SENSITIVE Sensitive     CEFTRIAXONE 16 INTERMEDIATE Intermediate     CIPROFLOXACIN 0.5 SENSITIVE Sensitive     GENTAMICIN 4 SENSITIVE Sensitive     IMIPENEM <=0.25 SENSITIVE Sensitive     PIP/TAZO 16 SENSITIVE Sensitive     TRIMETH/SULFA <=20 SENSITIVE Sensitive     CEFEPIME 16 INTERMEDIATE Intermediate     AMPICILLIN/SULBACTAM <=2 SENSITIVE Sensitive     * FEW ACINETOBACTER CALCOACETICUS/BAUMANNII COMPLEX   Pseudomonas aeruginosa - MIC*    CEFTAZIDIME 4 SENSITIVE Sensitive     CIPROFLOXACIN <=0.25 SENSITIVE Sensitive     GENTAMICIN <=1 SENSITIVE Sensitive     IMIPENEM 1 SENSITIVE Sensitive     PIP/TAZO 8 SENSITIVE Sensitive     CEFEPIME <=1 SENSITIVE Sensitive     * FEW PSEUDOMONAS AERUGINOSA  Fungus Culture With Stain     Status: None   Collection Time: 08/13/19 11:30 AM  Result Value Ref Range Status   Fungus Stain Final report  Final    Comment: (NOTE) Performed At: [facility] [place], [place] [id number] Rush Farmer MD CV:[id number]    Fungus (Mycology) Culture PENDING  Incomplete   Fungal Source BRONCHIAL ALVEOLAR LAVAGE  Corrected    Comment: Performed at [facility], [place] [address], [place], [place] [address] CORRECTED ON 10/21 AT 1659: PREVIOUSLY REPORTED AS TRACHEAL ASPIRATE    Aspergillus Ag, BAL/Serum     Status: None   Collection Time: 08/13/19 11:30 AM  Result Value Ref Range Status   Aspergillus Ag, BAL/Serum 0.06 0.00 - 0.49 Index Final    Comment: (NOTE) Performed At: [facility] [address] [place], [place] [id number] Rush Farmer MD [id number] Performed At: [facility] [place], [place] [id number] Katina Degree MDPhD OI:[id number]   Fungus Culture Result     Status: None   Collection Time: 08/13/19 11:30 AM  Result Value Ref Range Status   Result 1 Comment  Final    Comment: (NOTE) KOH/Calcofluor preparation:  no fungus observed. Performed At: [facility] [place], [place] [id number] Rush Farmer MD [id number]   Expectorated sputum assessment w rflx to resp cult     Status: None   Collection Time: 08/16/19  9:25 AM   Specimen: Expectorated Sputum  Result Value Ref Range Status   Specimen Description EXPECTORATED SPUTUM  Final   Special Requests Normal  Final   Sputum evaluation   Final    THIS SPECIMEN IS ACCEPTABLE FOR SPUTUM CULTURE Performed at [facility], [place] [address], [place], [place] [address]    Report Status 08/16/2019 FINAL  Final  Fungus Culture With Stain     Status: None (Preliminary result)   Collection Time: 08/16/19  9:25 AM  Result Value Ref Range Status   Fungus Stain Final report  Final    Comment: (  NOTE) Performed At: [facility] [place], [place] [id number] Rush Farmer MD [id number]    Fungus (Mycology) Culture PENDING  Incomplete   Fungal Source SPUTUM  Final    Comment: Performed at [facility], [place] [address] [place], [place] [address]  Culture, respiratory     Status: None   Collection Time: 08/16/19  9:25 AM  Result Value Ref Range Status   Specimen Description   Final    EXPECTORATED SPUTUM Performed at [place] [address] [place], [place]  [address]    Special Requests   Final    Normal Reflexed from [id number] Performed at [facility], [place] [address], [place], [place] [address]    Gram Stain   Final    ABUNDANT WBC PRESENT, PREDOMINANTLY PMN NO ORGANISMS SEEN Performed at [facility], [place] [address], [place], [place] [address]    Culture FEW PSEUDOMONAS AERUGINOSA  Final   Report Status 08/18/2019 FINAL  Final   Organism ID, Bacteria PSEUDOMONAS AERUGINOSA  Final      Susceptibility   Pseudomonas aeruginosa - MIC*    CEFTAZIDIME 4 SENSITIVE Sensitive     CIPROFLOXACIN <=0.25 SENSITIVE Sensitive     GENTAMICIN <=1 SENSITIVE Sensitive     IMIPENEM 1 SENSITIVE Sensitive     PIP/TAZO 8 SENSITIVE Sensitive     CEFEPIME 4 SENSITIVE Sensitive     * FEW PSEUDOMONAS AERUGINOSA  Fungus Culture Result     Status: None   Collection Time: 08/16/19  9:25 AM  Result Value Ref Range Status   Result 1 Comment  Final    Comment: (NOTE) KOH/Calcofluor preparation:  no fungus observed. Performed At: [facility] [address] [place], [place] [id number] Rush Farmer MD PF:[id number]   Culture, blood (Routine X 2) w Reflex to ID Panel     Status: None (Preliminary result)   Collection Time: 08/20/19 12:35 PM   Specimen: BLOOD  Result Value Ref Range Status   Specimen Description   Final    BLOOD LEFT HAND Performed at [place] [address], [place], [place] [address]    Special Requests   Final    BOTTLES DRAWN AEROBIC AND ANAEROBIC Blood Culture adequate volume Performed at [place] [address], [place], [place] [address]    Culture   Final    NO GROWTH 2 DAYS Performed at [place] [address], [place], [place] [address]    Report Status PENDING  Incomplete  Culture, blood (Routine X 2) w Reflex to ID Panel     Status: None (Preliminary result)   Collection Time: 08/20/19 12:48 PM   Specimen: BLOOD  Result Value Ref Range  Status   Specimen Description   Final    BLOOD LEFT ARM Performed at [place] [address], [place], [place] [address]    Special Requests   Final    BOTTLES DRAWN AEROBIC ONLY Blood Culture adequate volume Performed at [place] [address] [place], [place] [address]    Culture   Final    NO GROWTH 2 DAYS Performed at [place] [address], [place], [place] [address]    Report Status PENDING  Incomplete  Culture, respiratory     Status: None (Preliminary result)   Collection Time: 08/20/19 12:54 PM   Specimen: Tracheal Aspirate  Result Value Ref Range Status  Specimen Description   Final    TRACHEAL ASPIRATE Performed at [facility] [address] [place], [place] [address]    Special Requests   Final    NONE Performed at [facility], [place] [address] [place], [place] [address]    Gram Stain   Final    ABUNDANT WBC PRESENT,BOTH PMN AND MONONUCLEAR NO ORGANISMS SEEN    Culture   Final    CULTURE REINCUBATED FOR BETTER GROWTH Performed at [facility], [place] [address], [place], [place] [address]    Report Status PENDING  Incomplete      Radiology Studies: Dg Chest Port 1 View  Result Date: 08/22/2019 CLINICAL DATA:  Status post tracheostomy. COVID-19 viral pneumonia. EXAM: PORTABLE CHEST 1 VIEW COMPARISON:  08/22/2019 FINDINGS: A new tracheostomy tube is seen in appropriate position. Feeding tube and bilateral central venous catheters remain in stable position. Tip of the right subclavian central venous catheter seen overlying the inferior right atrium. Heart size is within normal limits allowing for low lung volumes. Right chest tube remains in place. No pneumothorax visualized. Bullous disease again seen in right lung base. Increased atelectasis or airspace disease is seen in the retrocardiac left lung base. Mild bilateral peripheral pulmonary airspace disease  shows no significant change. IMPRESSION: New tracheostomy tube in appropriate position. Right subclavian Center venous catheter tip overlies the lower right atrium. Increased atelectasis or airspace disease in retrocardiac left lung base. Electronically Signed   By: Marlaine Hind M.D.   On: 08/22/2019 16:49   Dg Chest Port 1 View  Result Date: 08/22/2019 CLINICAL DATA:  Intubated. EXAM: PORTABLE CHEST 1 VIEW COMPARISON:  08/21/2019 FINDINGS: Endotracheal tube in satisfactory position. Feeding tube extending into the distal stomach with its tip not included. Right subclavian catheter tip in the region of the superior cavoatrial junction or upper right atrium. No significant change in bilateral interstitial and patchy opacities and right basilar bullous changes. No definite pneumothorax. Minimal right pleural fluid. Normal sized heart. Thoracic spine degenerative changes. IMPRESSION: 1. No significant change in bilateral pneumonia and interstitial disease and right basilar bullous changes. 2. Minimal right pleural fluid. Electronically Signed   By: Claudie Revering M.D.   On: 08/22/2019 07:12       LOS: 29 days   Sundra Haddix [employer] on [email or web address]  08/23/2019, 8:53 AM

## 2019-08-23 NOTE — TOC Progression Note (Signed)
Transition of Care [facility]) - Progression Note    Patient Details  Name: [name] MRN: [id number] Date of Birth: [birth year]  Transition of Care [facility]) CM/SW Contact  Loletha Grayer Beverely Pace, RN Phone Number: 08/23/2019, 7:29 PM  Clinical Narrative:   Case manager acknowledges consult. Following daily to monitor patient's progress.Will await therapy notes. May he be blessed to continue to progress. .         Expected Discharge Plan and Services: to be determined                                                 Social Determinants of Health (SDOH) Interventions    Readmission Risk Interventions No flowsheet data found.

## 2019-08-23 NOTE — Progress Notes (Signed)
Spoke with family member and updated her. She spoke to pt via speaker phone. She had no concerns at this time. She was told if she had any question feel free to call back and we would address them .

## 2019-08-24 DIAGNOSIS — J80 Acute respiratory distress syndrome: Secondary | ICD-10-CM | POA: Diagnosis not present

## 2019-08-24 DIAGNOSIS — D62 Acute posthemorrhagic anemia: Secondary | ICD-10-CM | POA: Diagnosis not present

## 2019-08-24 DIAGNOSIS — E872 Acidosis: Secondary | ICD-10-CM | POA: Diagnosis not present

## 2019-08-24 DIAGNOSIS — R509 Fever, unspecified: Secondary | ICD-10-CM | POA: Diagnosis not present

## 2019-08-24 DIAGNOSIS — U071 COVID-19: Secondary | ICD-10-CM | POA: Diagnosis not present

## 2019-08-24 DIAGNOSIS — I69398 Other sequelae of cerebral infarction: Secondary | ICD-10-CM

## 2019-08-24 DIAGNOSIS — E8770 Fluid overload, unspecified: Secondary | ICD-10-CM | POA: Diagnosis not present

## 2019-08-24 DIAGNOSIS — Z930 Tracheostomy status: Secondary | ICD-10-CM | POA: Diagnosis not present

## 2019-08-24 DIAGNOSIS — N179 Acute kidney failure, unspecified: Secondary | ICD-10-CM | POA: Diagnosis not present

## 2019-08-24 DIAGNOSIS — D649 Anemia, unspecified: Secondary | ICD-10-CM | POA: Diagnosis not present

## 2019-08-24 DIAGNOSIS — I959 Hypotension, unspecified: Secondary | ICD-10-CM | POA: Diagnosis not present

## 2019-08-24 DIAGNOSIS — J9600 Acute respiratory failure, unspecified whether with hypoxia or hypercapnia: Secondary | ICD-10-CM | POA: Diagnosis not present

## 2019-08-24 DIAGNOSIS — J1289 Other viral pneumonia: Secondary | ICD-10-CM | POA: Diagnosis not present

## 2019-08-24 DIAGNOSIS — Z9689 Presence of other specified functional implants: Secondary | ICD-10-CM | POA: Diagnosis not present

## 2019-08-24 DIAGNOSIS — K250 Acute gastric ulcer with hemorrhage: Secondary | ICD-10-CM | POA: Diagnosis not present

## 2019-08-24 DIAGNOSIS — A4901 Methicillin susceptible Staphylococcus aureus infection, unspecified site: Secondary | ICD-10-CM | POA: Diagnosis not present

## 2019-08-24 DIAGNOSIS — J9601 Acute respiratory failure with hypoxia: Secondary | ICD-10-CM | POA: Diagnosis not present

## 2019-08-24 DIAGNOSIS — R579 Shock, unspecified: Secondary | ICD-10-CM | POA: Diagnosis not present

## 2019-08-24 HISTORY — DX: Other sequelae of cerebral infarction: I69.398

## 2019-08-24 LAB — CBC WITH DIFFERENTIAL/PLATELET
Abs Immature Granulocytes: 0.96 10*3/uL — ABNORMAL HIGH (ref 0.00–0.07)
Basophils Absolute: 0.1 10*3/uL (ref 0.0–0.1)
Basophils Relative: 1 %
Eosinophils Absolute: 0.3 10*3/uL (ref 0.0–0.5)
Eosinophils Relative: 3 %
HCT: 28.6 % — ABNORMAL LOW (ref 39.0–52.0)
Hemoglobin: 8.7 g/dL — ABNORMAL LOW (ref 13.0–17.0)
Immature Granulocytes: 11 %
Lymphocytes Relative: 7 %
Lymphs Abs: 0.7 10*3/uL (ref 0.7–4.0)
MCH: 29 pg (ref 26.0–34.0)
MCHC: 30.4 g/dL (ref 30.0–36.0)
MCV: 95.3 fL (ref 80.0–100.0)
Monocytes Absolute: 0.3 10*3/uL (ref 0.1–1.0)
Monocytes Relative: 3 %
Neutro Abs: 6.9 10*3/uL (ref 1.7–7.7)
Neutrophils Relative %: 75 %
Platelets: 194 10*3/uL (ref 150–400)
RBC: 3 MIL/uL — ABNORMAL LOW (ref 4.22–5.81)
RDW: 15.9 % — ABNORMAL HIGH (ref 11.5–15.5)
WBC: 9.2 10*3/uL (ref 4.0–10.5)
nRBC: 2.5 % — ABNORMAL HIGH (ref 0.0–0.2)

## 2019-08-24 LAB — RENAL FUNCTION PANEL
Albumin: 2.5 g/dL — ABNORMAL LOW (ref 3.5–5.0)
Anion gap: 8 (ref 5–15)
BUN: 38 mg/dL — ABNORMAL HIGH (ref 8–23)
CO2: 26 mmol/L (ref 22–32)
Calcium: 8.7 mg/dL — ABNORMAL LOW (ref 8.9–10.3)
Chloride: 103 mmol/L (ref 98–111)
Creatinine, Ser: 1.6 mg/dL — ABNORMAL HIGH (ref 0.61–1.24)
GFR calc Af Amer: 51 mL/min — ABNORMAL LOW (ref >60)
GFR calc non Af Amer: 44 mL/min — ABNORMAL LOW (ref >60)
Glucose, Bld: 117 mg/dL — ABNORMAL HIGH (ref 70–99)
Phosphorus: 2.9 mg/dL (ref 2.5–4.6)
Potassium: 4.4 mmol/L (ref 3.5–5.1)
Sodium: 137 mmol/L (ref 135–145)

## 2019-08-24 LAB — GLUCOSE, CAPILLARY
Glucose-Capillary: 109 mg/dL — ABNORMAL HIGH (ref 70–99)
Glucose-Capillary: 119 mg/dL — ABNORMAL HIGH (ref 70–99)
Glucose-Capillary: 122 mg/dL — ABNORMAL HIGH (ref 70–99)
Glucose-Capillary: 125 mg/dL — ABNORMAL HIGH (ref 70–99)
Glucose-Capillary: 129 mg/dL — ABNORMAL HIGH (ref 70–99)
Glucose-Capillary: 138 mg/dL — ABNORMAL HIGH (ref 70–99)

## 2019-08-24 LAB — MAGNESIUM: Magnesium: 2.6 mg/dL — ABNORMAL HIGH (ref 1.7–2.4)

## 2019-08-24 LAB — APTT: aPTT: 55 seconds — ABNORMAL HIGH (ref 24–36)

## 2019-08-24 MED ORDER — SODIUM CHLORIDE 0.9 % IV SOLN
1.0000 g | INTRAVENOUS | Status: AC
  Administered 2019-08-24 – 2019-08-25 (×2): 1 g via INTRAVENOUS
  Filled 2019-08-24 (×2): qty 1

## 2019-08-24 MED ORDER — CEFAZOLIN SODIUM-DEXTROSE 1-4 GM/50ML-% IV SOLN
1.0000 g | INTRAVENOUS | Status: AC
  Administered 2019-08-26 – 2019-09-15 (×21): 1 g via INTRAVENOUS
  Filled 2019-08-24 (×25): qty 50

## 2019-08-24 NOTE — Progress Notes (Signed)
PROGRESS NOTE  [name] HUT:[id number] DOB: August 26, 1951 DOA: 07/25/2019  PCP: Kathyrn Drown, MD  Brief History/Interval Summary: 68 year old with a history of GERD and BPH who presented to [facility], ED with S OB and was found to be hypoxic.  He was initially diagnosed with COVID-19 September 20.  1 to 2 days prior to his presentation he developed worsening shortness of breath with anorexia.  His wife's physician during a teleconference call noted that the patient himself is very tachypneic and ultimately EMS was sent to the house.  EMS found the patient to have saturations in the 40s.  In the ED on nonrebreather the patient sats only improved to the 80s.  The patient was severely confused and required emergent intubation.  Chest x-ray confirmed multifocal infiltrates.  CT chest was negative for PE.  Reason for Visit: Acute respiratory failure with hypoxia.  Pneumonia due to COVID-19.  Significant Events: 10/3 admit to [facility] from [facility] ED 10/13 right pneumothorax -pigtail chest tube placed 10/14 Nephrology consulted 10/15 begin CRRT -endobronchial blocker placed 10/16 melena 10/17 A. fib with RVR -EGD 10/18 transfuse PRBC 10/20 Heparin drip resumed 10/21 abrupt drop in hemoglobin -transfuse 1 unit PRBC 10/23 tPA per chest tube  10/25 endobronchial blocker removed  10/26 1U PRBC 10/28: 2 units of PRBC transfused  Consultants: Pulmonology.  Nephrology.  Gastroenterology.  Procedures:  EGD 10/17:   Impression:               - No active bleeding or blood or coffee ground                            material in the upper tract at the time of this                            exam.                           - Normal esophagus.                           - Excessive gastric fluid (residual tube feeding)                            without anatomic gastric outlet obstruction. Fluid                            aspiration performed. The stomach was somewhat                             dilated and patulous, causing looping of the                            endoscope along the greater curve during                            advancement, making it difficult to traverse the                            pylorus, although this was eventually successfully  accomplished.                           - Non-bleeding gastric ulcer with pigmented                            material suggesting stigma of recent hemorrhage.                            Injected with good blanching. Clips were placed,                            effectively sealing off the ulcer.                           - Normal examined duodenum.  Tracheostomy on 10/30  Antibiotics: Anti-infectives (From admission, onward)   Start     Dose/Rate Route Frequency Ordered Stop   08/26/19 1000  ceFAZolin (ANCEF) IVPB 1 g/50 mL premix     1 g 100 mL/hr over 30 Minutes Intravenous Every 24 hours 08/24/19 1021     08/24/19 1100  meropenem (MERREM) 1 g in sodium chloride 0.9 % 100 mL IVPB     1 g 200 mL/hr over 30 Minutes Intravenous Every 24 hours 08/24/19 1021 08/26/19 1059   08/23/19 1100  ceFAZolin (ANCEF) IVPB 2g/100 mL premix  Status:  Discontinued     2 g 200 mL/hr over 30 Minutes Intravenous Every 12 hours 08/23/19 1048 08/24/19 1021   08/21/19 1700  vancomycin (VANCOCIN) IVPB 750 mg/150 ml premix  Status:  Discontinued     750 mg 150 mL/hr over 60 Minutes Intravenous Every 24 hours 08/21/19 0843 08/23/19 1048   08/21/19 1400  vancomycin (VANCOCIN) IVPB 750 mg/150 ml premix  Status:  Discontinued     750 mg 150 mL/hr over 60 Minutes Intravenous Every 24 hours 08/20/19 1146 08/20/19 1218   08/21/19 1000  meropenem (MERREM) 1 g in sodium chloride 0.9 % 100 mL IVPB  Status:  Discontinued     1 g 200 mL/hr over 30 Minutes Intravenous Every 12 hours 08/20/19 1535 08/23/19 1048   08/21/19 0413  meropenem (MERREM) 500 mg in sodium chloride 0.9 % 100 mL IVPB  Status:  Discontinued     500 mg 200  mL/hr over 30 Minutes Intravenous Every 24 hours 08/20/19 1245 08/20/19 1535   08/20/19 1330  vancomycin (VANCOCIN) 1,750 mg in sodium chloride 0.9 % 500 mL IVPB     1,750 mg 250 mL/hr over 120 Minutes Intravenous  Once 08/20/19 1222 08/20/19 1619   08/20/19 1300  vancomycin (VANCOCIN) 1,250 mg in sodium chloride 0.9 % 250 mL IVPB  Status:  Discontinued     1,250 mg 166.7 mL/hr over 90 Minutes Intravenous  Once 08/20/19 1146 08/20/19 1218   08/20/19 1224  vancomycin variable dose per unstable renal function (pharmacist dosing)  Status:  Discontinued      Does not apply See admin instructions 08/20/19 1225 08/21/19 1152   08/15/19 1600  meropenem (MERREM) 1 g in sodium chloride 0.9 % 100 mL IVPB  Status:  Discontinued     1 g 200 mL/hr over 30 Minutes Intravenous Every 12 hours 08/15/19 1543 08/20/19 1245   08/15/19 1000  voriconazole (VFEND) tablet 200 mg  Status:  Discontinued  200 mg Per Tube Every 12 hours 08/14/19 1148 08/15/19 1134   08/14/19 1600  anidulafungin (ERAXIS) 100 mg in sodium chloride 0.9 % 100 mL IVPB  Status:  Discontinued     100 mg 78 mL/hr over 100 Minutes Intravenous Every 24 hours 08/13/19 1645 08/14/19 1148   08/14/19 1300  voriconazole (VFEND) tablet 400 mg     400 mg Per Tube Every 12 hours 08/14/19 1148 08/14/19 2217   08/13/19 1700  anidulafungin (ERAXIS) 200 mg in sodium chloride 0.9 % 200 mL IVPB     200 mg 78 mL/hr over 200 Minutes Intravenous  Once 08/13/19 1645 08/13/19 2030   08/13/19 0200  ceFAZolin (ANCEF) IVPB 2g/100 mL premix  Status:  Discontinued     2 g 200 mL/hr over 30 Minutes Intravenous Every 12 hours 08/11/19 1512 08/15/19 1543   08/12/19 0200  ceFEPIme (MAXIPIME) 2 g in sodium chloride 0.9 % 100 mL IVPB     2 g 200 mL/hr over 30 Minutes Intravenous Every 12 hours 08/11/19 1509 08/12/19 1341   08/07/19 2200  ceFEPIme (MAXIPIME) 2 g in sodium chloride 0.9 % 100 mL IVPB  Status:  Discontinued     2 g 200 mL/hr over 30 Minutes Intravenous  Every 12 hours 08/07/19 1540 08/11/19 1509   08/07/19 1000  anidulafungin (ERAXIS) 100 mg in sodium chloride 0.9 % 100 mL IVPB  Status:  Discontinued     100 mg 78 mL/hr over 100 Minutes Intravenous Every 24 hours 08/06/19 0934 08/08/19 1203   08/07/19 0800  ceFEPIme (MAXIPIME) 2 g in sodium chloride 0.9 % 100 mL IVPB  Status:  Discontinued     2 g 200 mL/hr over 30 Minutes Intravenous Every 24 hours 08/06/19 1036 08/07/19 1540   08/06/19 1000  anidulafungin (ERAXIS) 200 mg in sodium chloride 0.9 % 200 mL IVPB    Note to Pharmacy: please   200 mg 78 mL/hr over 200 Minutes Intravenous Every 24 hours 08/06/19 0925 08/06/19 1600   08/06/19 0800  ceFEPIme (MAXIPIME) 2 g in sodium chloride 0.9 % 100 mL IVPB  Status:  Discontinued     2 g 200 mL/hr over 30 Minutes Intravenous Every 12 hours 08/06/19 0733 08/06/19 1036   08/02/19 2200  ceFAZolin (ANCEF) IVPB 2g/100 mL premix  Status:  Discontinued     2 g 200 mL/hr over 30 Minutes Intravenous Every 8 hours 08/02/19 1338 08/06/19 0733   08/02/19 1400  vancomycin (VANCOCIN) 1,250 mg in sodium chloride 0.9 % 250 mL IVPB  Status:  Discontinued     1,250 mg 166.7 mL/hr over 90 Minutes Intravenous Every 24 hours 08/01/19 1320 08/02/19 1332   08/01/19 1330  vancomycin (VANCOCIN) 2,000 mg in sodium chloride 0.9 % 500 mL IVPB     2,000 mg 250 mL/hr over 120 Minutes Intravenous  Once 08/01/19 1158 08/01/19 1636   08/01/19 1300  ceFEPIme (MAXIPIME) 2 g in sodium chloride 0.9 % 100 mL IVPB  Status:  Discontinued     2 g 200 mL/hr over 30 Minutes Intravenous Every 12 hours 08/01/19 1158 08/02/19 1332   07/26/19 1600  remdesivir 100 mg in sodium chloride 0.9 % 250 mL IVPB     100 mg 500 mL/hr over 30 Minutes Intravenous Every 24 hours 07/26/19 0132 07/29/19 1823   07/26/19 0215  remdesivir 200 mg in sodium chloride 0.9 % 250 mL IVPB     200 mg 500 mL/hr over 30 Minutes Intravenous Once 07/26/19 0132 07/26/19 0520  Subjective/Interval  History: Nursing staff raising concern about patient's inability to move his left side.  Patient is slow to respond.  Does not follow all commands.  He does squeeze with his right hand.    Assessment/Plan:  Acute Hypoxic Resp. Failure/Pneumonia due to COVID-19   Lab Results  Component Value Date   SARSCOV2NAA Detected (A) 07/22/2019   COVID-19 specific Treatment: Decadron 10/2 > 10/12 Remdesivir 10/2 > 10/6 Actemra 10/2 Convalescent plasma 10/3  Patient underwent tracheostomy on 10/30.  Did well on trach collar for most of the day yesterday but then started getting tachypneic.  Placed back on pressure support ventilation yesterday evening.  Pulmonology continues to follow.  Defer to them as to disposition.  Plan is to get him back on trach collar today and see how he does.  Will determine further course of action tomorrow.  Patient has been weaned off of Levophed.  Patient has other pulmonary issues as discussed below.    From a COVID-19 standpoint he has completed course of remdesivir as well as steroids.  He also received Actemra and convalescent plasma.  PT and OT evaluation.  Could consider retesting him for COVID-19 as it looks like he may need rehabilitation.  Concern for left-sided weakness Patient has been in the hospital for 30 days most of which time he has been on the ventilator.  It is very hard to know if patient has a true neurological deficits or if he is just deconditioned.  Unclear as to the amount of time he has had this left-sided weakness as he has been on the ventilator.  We would like to do neuroimaging.  Ideally we would like to do MRI of his brain.  Since the plan is for him to go to [facility] in the next few days this can be done there.  Even if he does have an acute stroke is not a candidate for TPA due to significant GI bleed and unclear timing of his stroke.  Discussed with CCM as well.  They agree.  Even if the patient has a bleed he is not a candidate for  neurosurgery.  We will for order MRI brain once he has been transferred to [facility].  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  Bedside bronchoscopy revealed material in the airway.  TPA infused via chest tube on 10/23.  Chest tube management per pulmonology.  Endobronchial blocker removed on 10/25.    Fever Patient was noted to have fever on 10/27 on 10/28.  WBC was noted to be normal.  Blood cultures were repeated.  Sputum cultures were repeated.  Patient was on antibiotics including meropenem for Pseudomonas in BAL specimen as discussed below.  Patient was started on vancomycin.  Fever has subsided.  Procalcitonin levels improved from 5.15-4.38.  We will recheck levels tomorrow morning.  Blood cultures were negative.  WBC was normal.  Respiratory cultures again grew Pseudomonas.  Patient had completed 7 days of meropenem.  However since he has grown Pseudomonas again we will extend the treatment to a total of 10 days.  Patient was transitioned from vancomycin to cefazolin by critical care medicine.  It appears that plan is for 24-day course beginning 10/31.    MSSA bacteremia/septic shock 2 out of 2 blood cultures positive on 10/9.  Patient was on cefazolin.  Discussed again with ID and pharmacy.  Concern remains for recurrent bacteremia  and possible endocarditis due to recurrence of fever.  So they have recommended cefazolin to be continued for 24 days from 10/31.  Pseudomonas and Acinetobacter in BAL specimen Patient was started on meropenem on 10/23 for a 7-day treatment.  Treatment extended to complete a 10-day course.  Acute renal failure Patient on CRRT. CRRT was held on 10/27 due to alkalosis from citrate protocol.  There was also an issue of inability to use heparin due to GI bleed.  There was also some mention of potentially transitioning to intermittent  hemodialysis.  However CRRT was restarted with low-dose heparin.  Nephrology is following.  Now that he is off of pressors and seems to be stable from a respiratory standpoint could transition him to intermittent hemodialysis.  Nephrology has stopped CRRT today.  They plan to monitor for renal response over the next few days and then decide on intermittent hemodialysis.    Acute GI bleed secondary to gastric ulcer Patient seen by gastroenterology.  Underwent EGD with clipping of vessel and large ulcer.  Anticoagulation was initially held and resumed on 10/20.  However there was an abrupt drop in hemoglobin subsequently.  There was a suspicion that he was having experiencing slow intermittent GI bleed.  Patient again transfused on 10/28.  Patient with brown stools.   Continue PPI.  Twice a day.  H. pylori antibody was noted to be elevated.  Patient will need 2 weeks treatment for H. pylori when he is ready for discharge.  Plus he will need follow-up with [facility] gastroenterology.  Acute blood loss anemia Likely due to GI bleed.  Patient has been transfused with 5 units of PRBC so far during this hospitalization.  Last transfused with 2 units on 10/28.  Hemoglobin did respond appropriately.  Hemoglobin is low but stable.  No overt bleeding noted.  Elevated D-dimer Noted to be significantly elevated on 10/19 greater than 20.  Unable to fully anticoagulate due to GI bleed.  No evidence for DVT on Dopplers on 10/8.  Thought to be related to suspected empyema in the right lung.  D-dimer improved to 8.83 on 10/23.  At this time do not anticipate any further work-up as there are many reasons why the patient could have elevated D-dimer.  Paroxysmal atrial fibrillation with RVR Patient without any prior history of atrial fibrillation.  Atrial fibrillation likely occurred in the setting of acute illness.  Currently sinus rhythm.  Unable to fully anticoagulate due to GI bleeding.  History of BPH Monitor for urinary  retention  FEN Not on IV fluids.  Has been on CRRT.  Monitor electrolytes.  On tube feedings.  Pressure injuries Pressure Injury 08/16/19 Lip Medial;Lower Stage I -  Intact skin with non-blanchable redness of a localized area usually over a bony prominence. healing  (Active)  08/16/19 2000  Location: Lip  Location Orientation: Medial;Lower  Staging: Stage I -  Intact skin with non-blanchable redness of a localized area usually over a bony prominence.  Wound Description (Comments): healing   Present on Admission: No     Pressure Injury 08/16/19 Lip Lower;Mid Stage I -  Intact skin with non-blanchable redness of a localized area usually over a bony prominence. healing  (Active)  08/16/19 2342  Location: Lip  Location Orientation: Lower;Mid  Staging: Stage I -  Intact skin with non-blanchable redness of a localized area usually over a bony prominence.  Wound Description (Comments): healing   Present on Admission:     DVT Prophylaxis: Subcutaneous heparin Code Status:  DNR Family Communication: PCCM discussed with patient's family Disposition Plan: Plan to transfer to [facility] in the next 24 to 48 hours depending on his respiratory status.   Medications:  Scheduled:  chlorhexidine  15 mL Mouth/Throat BID   Chlorhexidine Gluconate Cloth  6 each Topical Daily   darbepoetin (ARANESP) injection - NON-DIALYSIS  100 mcg Subcutaneous Q Wed-1800   feeding supplement (PRO-STAT SUGAR FREE 64)  60 mL Per Tube TID   heparin injection (subcutaneous)  7,500 Units Subcutaneous Q8H   insulin aspart  0-9 Units Subcutaneous Q4H   mouth rinse  15 mL Mouth Rinse 10 times per day   pantoprazole (PROTONIX) IV  40 mg Intravenous Q12H   polyethylene glycol  17 g Per Tube QHS   sennosides  5 mL Per Tube QHS   sucralfate  1 g Oral Q6H   Continuous:  sodium chloride Stopped (08/21/19 1920)   sodium chloride Stopped (08/24/19 0945)   [START ON 08/26/2019]  ceFAZolin (ANCEF) IV      dextrose Stopped (08/16/19 1623)   feeding supplement (VITAL 1.5 CAL) 60 mL/hr at 08/24/19 0700   meropenem (MERREM) IV 1 g (08/24/19 1114)   SWN:IOEVOJ chloride, sodium chloride, acetaminophen (TYLENOL) oral liquid 160 mg/5 mL, dextrose, fentaNYL (SUBLIMAZE) injection, heparin, metoprolol tartrate, pneumococcal 23 valent vaccine   Objective:  Vital Signs  Vitals:   08/24/19 0800 08/24/19 0850 08/24/19 0900 08/24/19 1000  BP: 104/70 128/74 101/60 90/68  Pulse: 81 84 85 87  Resp: (!) 30 (!) 21 (!) 27 (!) 32  Temp: 97.6 F (36.4 C)     TempSrc: Oral     SpO2: 97% 93% 98% 100%  Weight:      Height:        Intake/Output Summary (Last 24 hours) at 08/24/2019 1130 Last data filed at 08/24/2019 1000 Gross per 24 hour  Intake 2032.34 ml  Output 2329 ml  Net -296.66 ml   Filed Weights   08/22/19 0500 08/23/19 0441 08/24/19 0500  Weight: 82.7 kg 77.5 kg 72.9 kg    General appearance: Noted to be awake alert.  Not does not follow all commands.  Not tracking. Resp: Tracheostomy present.  Transition back to trach collar this morning.  Coarse breath sounds bilaterally.  Few crackles at the bases. Cardio: S1-S2 is normal regular.  No S3-S4.  No rubs murmurs or bruit GI: Abdomen is soft.  Nontender nondistended.  Bowel sounds are present normal.  No masses organomegaly Extremities: Minimal edema bilateral lower extremities Neurologic: He is awake alert.  He does squeeze with his right hand.  Today he is not beginning his toes.  No significant change compared to yesterday but nursing staff concerned about his left side.     Lab Results:  Data Reviewed: I have personally reviewed following labs and imaging studies  CBC: Recent Labs  Lab 08/20/19 0400  08/21/19 0500 08/22/19 0305 08/22/19 0415 08/23/19 0415 08/23/19 0453 08/24/19 0515  WBC 8.7  --  10.4  --  8.5 7.8  --  9.2  NEUTROABS 7.2  --  8.3*  --  6.3 5.7  --  6.9  HGB 6.7*   < > 9.0* 8.2* 8.3* 8.3* 8.2* 8.7*   HCT 21.6*   < > 29.4* 24.0* 27.0* 27.1* 24.0* 28.6*  MCV 97.3  --  95.1  --  97.5 96.4  --  95.3  PLT 187  --  181  --  175 179  --  194   < > =  values in this interval not displayed.    Basic Metabolic Panel: Recent Labs  Lab 08/21/19 0500  08/22/19 0415 08/22/19 1750 08/23/19 0415 08/23/19 0453 08/23/19 1550 08/24/19 0515  NA 138   < > 137 138 136 136 136 137  K 4.7   < > 5.0 5.9* 4.6 4.6 4.5 4.4  CL 102   < > 103 103 102  --  104 103  CO2 28   < > _0 --  25 26  GLUCOSE 147*   < > 140* 107* 124*  --  103* 117*  BUN 74*   < > 48* 56* 45*  --  41* 38*  CREATININE 3.00*   < > 2.37* 2.92* 2.01*  --  2.02* 1.60*  CALCIUM 8.6*   < > 8.4* 8.8* 8.5*  --  8.5* 8.7*  MG 2.4  --  2.5*  --  2.5*  --   --  2.6*  PHOS 5.0*   < > 3.9 7.1* 3.5  --  3.3 2.9   < > = values in this interval not displayed.    GFR: Estimated Creatinine Clearance: 46.8 mL/min (A) (by C-G formula based on SCr of 1.6 mg/dL (H)).  Liver Function Tests: Recent Labs  Lab 08/22/19 0415 08/22/19 1750 08/23/19 0415 08/23/19 1550 08/24/19 0515  ALBUMIN 2.1* 2.1* 2.3* 2.4* 2.5*     CBG: Recent Labs  Lab 08/23/19 1550 08/23/19 2110 08/24/19 0011 08/24/19 0326 08/24/19 0736  GLUCAP 104* 141* 129* 109* 125*      Recent Results (from the past 240 hour(s))  Expectorated sputum assessment w rflx to resp cult     Status: None   Collection Time: 08/16/19  9:25 AM   Specimen: Expectorated Sputum  Result Value Ref Range Status   Specimen Description EXPECTORATED SPUTUM  Final   Special Requests Normal  Final   Sputum evaluation   Final    THIS SPECIMEN IS ACCEPTABLE FOR SPUTUM CULTURE Performed at [facility], [place] [address], [place], [place] [address]    Report Status 08/16/2019 FINAL  Final  Fungus Culture With Stain     Status: None (Preliminary result)   Collection Time: 08/16/19  9:25 AM  Result Value Ref Range Status   Fungus Stain Final report  Final    Comment:  (NOTE) Performed At: [facility] [place], [place] [id number] Rush Farmer MD AY:[id number]    Fungus (Mycology) Culture PENDING  Incomplete   Fungal Source SPUTUM  Final    Comment: Performed at [facility], [place] [address] [place], [place] [address]  Culture, respiratory     Status: None   Collection Time: 08/16/19  9:25 AM  Result Value Ref Range Status   Specimen Description   Final    EXPECTORATED SPUTUM Performed at [place] [address] [place], [place] [address]    Special Requests   Final    Normal Reflexed from [phone] Performed at [facility], [place] [address], [place], [place] [address]    Gram Stain   Final    ABUNDANT WBC PRESENT, PREDOMINANTLY PMN NO ORGANISMS SEEN Performed at [facility], [place] [address] [place], [place] [address]    Culture FEW PSEUDOMONAS AERUGINOSA  Final   Report Status 08/18/2019 FINAL  Final   Organism ID, Bacteria PSEUDOMONAS AERUGINOSA  Final      Susceptibility   Pseudomonas aeruginosa - MIC*  CEFTAZIDIME 4 SENSITIVE Sensitive     CIPROFLOXACIN <=0.25 SENSITIVE Sensitive     GENTAMICIN <=1 SENSITIVE Sensitive     IMIPENEM 1 SENSITIVE Sensitive     PIP/TAZO 8 SENSITIVE Sensitive     CEFEPIME 4 SENSITIVE Sensitive     * FEW PSEUDOMONAS AERUGINOSA  Fungus Culture Result     Status: None   Collection Time: 08/16/19  9:25 AM  Result Value Ref Range Status   Result 1 Comment  Final    Comment: (NOTE) KOH/Calcofluor preparation:  no fungus observed. Performed At: [facility] [address] [place], [place] [id number] Rush Farmer MD NI:[id number]   Culture, blood (Routine X 2) w Reflex to ID Panel     Status: None (Preliminary result)   Collection Time: 08/20/19 12:35 PM   Specimen: BLOOD  Result Value Ref Range Status   Specimen Description   Final    BLOOD LEFT HAND Performed at [place]  [address], [place], [place] [address]    Special Requests   Final    BOTTLES DRAWN AEROBIC AND ANAEROBIC Blood Culture adequate volume Performed at [place] [address] [place], [place] [address]    Culture   Final    NO GROWTH 4 DAYS Performed at [facility], [place] [address], [place], [place] [address]    Report Status PENDING  Incomplete  Culture, blood (Routine X 2) w Reflex to ID Panel     Status: None (Preliminary result)   Collection Time: 08/20/19 12:48 PM   Specimen: BLOOD  Result Value Ref Range Status   Specimen Description   Final    BLOOD LEFT ARM Performed at [place] [address], [place], [place] [address]    Special Requests   Final    BOTTLES DRAWN AEROBIC ONLY Blood Culture adequate volume Performed at [place] [address], [place], [place] [address]    Culture   Final    NO GROWTH 4 DAYS Performed at [facility], [place] [address], [place], [place] [address]    Report Status PENDING  Incomplete  Culture, respiratory     Status: None   Collection Time: 08/20/19 12:54 PM   Specimen: Tracheal Aspirate  Result Value Ref Range Status   Specimen Description   Final    TRACHEAL ASPIRATE Performed at [place] [address], [place], [place] [address]    Special Requests   Final    NONE Performed at [facility], [place] [address], [place], [place] [address]    Gram Stain   Final    ABUNDANT WBC PRESENT,BOTH PMN AND MONONUCLEAR NO ORGANISMS SEEN Performed at [facility], [place] [address] [place], [place] [address]    Culture RARE PSEUDOMONAS AERUGINOSA  Final   Report Status 08/23/2019 FINAL  Final   Organism ID, Bacteria PSEUDOMONAS AERUGINOSA  Final      Susceptibility   Pseudomonas aeruginosa - MIC*    CEFTAZIDIME 4 SENSITIVE Sensitive     CIPROFLOXACIN <=0.25 SENSITIVE Sensitive     GENTAMICIN <=1 SENSITIVE Sensitive      IMIPENEM 1 SENSITIVE Sensitive     PIP/TAZO 8 SENSITIVE Sensitive     CEFEPIME 4 SENSITIVE Sensitive     * RARE PSEUDOMONAS AERUGINOSA      Radiology Studies: Dg Chest Port 1 View  Result Date: 08/23/2019 CLINICAL DATA:  COVID-19 positive, endotracheal tube in place. EXAM:  PORTABLE CHEST 1 VIEW COMPARISON:  Chest x-rays dated 08/22/2019. FINDINGS: Tubes and lines are stable in position, including a RIGHT-sided chest tube. Prominent lucency again noted at the RIGHT lung base, stable in the short-term interval, compatible with the air that was associated with a complicated pleural effusion as demonstrated on chest CT of 08/13/2019, at that time suspected to represent sequela of manipulation of a bronchial occlusion tube at bronchoscopy. Patchy airspace opacities are again seen bilaterally, stable, presumed pneumonia. No new lung findings. IMPRESSION: 1. Stable chest x-ray. Tubes and lines are stable in position, including the RIGHT-sided chest tube with tip positioned at the medial aspects of the RIGHT lung base 2. Patchy airspace opacities bilaterally, stable, presumed bilateral pneumonia. 3. Stable appearance of the collection of air at the RIGHT lung base, as described above. 4. No new lung findings. Electronically Signed   By: Franki Cabot M.D.   On: 08/23/2019 09:04   Dg Chest Port 1 View  Result Date: 08/22/2019 CLINICAL DATA:  Status post tracheostomy. COVID-19 viral pneumonia. EXAM: PORTABLE CHEST 1 VIEW COMPARISON:  08/22/2019 FINDINGS: A new tracheostomy tube is seen in appropriate position. Feeding tube and bilateral central venous catheters remain in stable position. Tip of the right subclavian central venous catheter seen overlying the inferior right atrium. Heart size is within normal limits allowing for low lung volumes. Right chest tube remains in place. No pneumothorax visualized. Bullous disease again seen in right lung base. Increased atelectasis or airspace disease is seen in  the retrocardiac left lung base. Mild bilateral peripheral pulmonary airspace disease shows no significant change. IMPRESSION: New tracheostomy tube in appropriate position. Right subclavian Center venous catheter tip overlies the lower right atrium. Increased atelectasis or airspace disease in retrocardiac left lung base. Electronically Signed   By: Marlaine Hind M.D.   On: 08/22/2019 16:49       LOS: 30 days   [name] [employer] on [email or web address]  08/24/2019, 11:30 AM

## 2019-08-24 NOTE — Evaluation (Signed)
[facility] Speaking Valve - Evaluation Patient Details  Name: [name] MRN: [id number] Date of Birth: 1951/08/21  Today's Date: 08/24/2019 Time: 8676-7209 SLP Time Calculation (min) (ACUTE ONLY): 15 min  Past Medical History:  Past Medical History:  Diagnosis Date  . COVID-19   . Heartburn    Past Surgical History:  Past Surgical History:  Procedure Laterality Date  . ACHILLES TENDON SURGERY Right 2012  . COLONOSCOPY  2009   [facility]  . ESOPHAGOGASTRODUODENOSCOPY N/A 12/25/2014   Procedure: ESOPHAGOGASTRODUODENOSCOPY (EGD);  Surgeon: Danie Binder, MD;  Location: [facility];  Service: Endoscopy;  Laterality: N/A;  830am  . ESOPHAGOGASTRODUODENOSCOPY N/A 08/09/2019   Procedure: ESOPHAGOGASTRODUODENOSCOPY (EGD);  Surgeon: Ronald Lobo, MD;  Location: [facility];  Service: Endoscopy;  Laterality: N/A;  Patient is already sedated, so I do not anticipate need for further sedation.  Okay from my standpoint to do either at the bedside or in the operating room  . HEMOSTASIS CLIP PLACEMENT  08/09/2019   Procedure: HEMOSTASIS CLIP PLACEMENT;  Surgeon: Ronald Lobo, MD;  Location: [facility];  Service: Endoscopy;;  . HEMOSTASIS CONTROL  08/09/2019   Procedure: HEMOSTASIS CONTROL;  Surgeon: Ronald Lobo, MD;  Location: [facility];  Service: Endoscopy;;  epi   . KNEE SURGERY Left 1980's  . none    . WRIST SURGERY Left 1970's   HPI:  68 year old with a history of GERD and BPH who presented to [facility], ED with S OB and was found to be hypoxic.  He was initially diagnosed with COVID-19 September 20.  1 to 2 days prior to his presentation he developed worsening shortness of breath with anorexia. Admitted on 10/3, intubated.  EGD on 10/17 showed normal esophagus but excessive gastric fluid. CT scan of the chest on 10/21 showed findings consistent with an empyema as well as persistent pneumothorax.  Bedside bronchoscopy revealed material in the airway. Trached on 10/30. On ATC by  10/31. Pt has been observed to be less attentive to left side, does not move the left arm. No neuro imaging yet.   Assessment / Plan / Recommendation Clinical Impression  Initial PMSV evaluation complete, though session quite short due to limited pt participation. Pt tachypneic at baseline, RN just suctioned. RT present to deflate cuff with immediate oral expectoration of secretions and audible upper airway airflow. Green PMSV with adapter placed in closed circuit trach collor connection. No significant change in pts vitals, RR returned to 20s. However, pt avoided eye contact, did not follow commands other than to nod head in response to questions. No oral movement, no articulation, no volitional phonation despite brief cueing attempts. Used RN as therapeutic agent given that she is familiar to pt, He nodded yes when asked if he wanted to rest and be left alone so SLP consented. Given good airflow to upper airway and no signs of intolerance of brief trial, recommend RT and RN can attempt brief PMSV placement trials (1-5 minutes given potential for fatigue) as long as cuff remains deflated. RN reports pt is more responsive in am, will attempt earlier session in the future.  SLP Visit Diagnosis: Aphonia (R49.1)    SLP Assessment  Patient needs continued Speech Lanaguage Pathology Services    Follow Up Recommendations  LTACH    Frequency and Duration min 2x/week       PMSV Trial PMSV was placed for: 2 minutes Able to redirect subglottic air through upper airway: Yes Able to Attain Phonation: No attempt to phonate Able to Expectorate Secretions:  Yes Level of Secretion Expectoration with PMSV: Oral Respirations During Trial: 30 SpO2 During Trial: 96 % Behavior: No attempt to communicate   Tracheostomy Tube  Additional Tracheostomy Tube Assessment Trach Collar Period: 2 days(vent at night) Secretion Description: bloody thin Level of Secretion Expectoration: Tracheal    Vent Dependency   Vent Dependent: Yes Nocturnal Vent: Yes    Cuff Deflation Trial  GO Tolerated Cuff Deflation: Yes Length of Time for Cuff Deflation Trial: 10 mintues Behavior: Alert;Cooperative        Rayne Cowdrey, Katherene Ponto 08/24/2019, 4:07 PM

## 2019-08-24 NOTE — Progress Notes (Signed)
NAME:  Bastion Bolger, MRN:  [id number], DOB:  12-18-1950, LOS: 36 ADMISSION DATE:  07/25/2019, CONSULTATION DATE:  10/3 REFERRING MD:  Nadara Mustard, CHIEF COMPLAINT:  Dyspnea   Brief History   68 yo male dx with COVID 07/22/19 presented to [facility] ER 07/25/19 with progressive dyspnea and hypoxia requiring intubation from COVID 19 pneumonia.  Past Medical History  GERD  Significant Hospital Events   10/03 Admit to [facility] from [facility] ER, start decadron and remdesivir, given tociluzimab; prone positioning 10/04 convalescent plasma 10/05 stop prone positioning 10/06 convalescent plasma; prone positioning again 10/09 start ABx 10/10 MSSA bacteremia, CVL d/ced; ID consulted; increase OG tube outpt 10/11 vent weaning trial started 10/13 fever, pneumothorax, pig tail chest tube placed 10/14 worsening hypotension and renal fx >> consulted nephrology; worsening PTX >> replaced chest tube; GNR in sputum >> ABx changed 10/15 start CRRT; endobronchial blocker placed for persistent air leak 10/16 endobronchial blocker repositioned, changed chest tube to water seal; melana with ABLA >> GI consulted; transfuse PRBC 10/17 persistent air leak, increased WOB; start nimbex gtt >> air leak decreased; EGD; A fib with RVR >> start amiodarone 10/18 transfuse PRBC; GI s/o 10/20 resume heparin gtt 10/21 stopped nimbex, chest tube to suction, stopped heparin drip because Hgb dropped again 10/22 TPA in chest tube, repositioned endobronchial blocker 10/23 deflated endobronchial balloon 10/24 small air leak  10/25 removed bronchial blocker  Consults:  ID Nephrology Gastroenterology PCCM  Procedures:  10/2 ETT>  10/2 R IJ CVL > 10/10 10/13 Rt pig tail chest tube >> 10/14 10/14 R 68F chest tube >  10/14 R subclavian CVL >  10/14 L IJ HD cath >  10/14 Lt radial a line >   Significant Diagnostic Tests:  10/2 CT angiogram chest > extensive bilateral airspace disease predominantly posterior 10/8 doppler legs b/l >  no DVT 10/15 renal u/s > normal 10/17 EGD > non bleeding gastric ulcer  10/19 Echo >> EF 60 to 65%, hyperdynamic LV with small LVOT gradient 10/22 CT chest > large collection of air in pleural space with fluid, pneumonia bilaterally R>L endobronchial blocker in place, chest tube anterior  Micro Data:  9/20 SARS COV 2 > POSITIVE 10/2 blood > negative 10/9 blood > MSSA 2/4 10/9 resp > MSSA, Pseudomonas 10/13 blood >> negative 10/21 bronch wash bacterial > pseduomonas, acinetobacter baumannii 10/21 bronch wash fungal >  10/21 bronch wash aspergillus antigen> negative 10/24 bronchoscopy wash>  10/28 Blood cx>>> 10/28 Sputum cx>>>  Antimicrobials:  10/3 remdesivir > 10/6 10/3 actemra 10/3 decadron > 10/12 10/4 convalescent plasma 10/6 convalescent plasma  10/9 vancomycin > 10/10 10/9 cefepime > 10/10 10/10 ancef > 10/13 10/13 cefepime > 10/20 10/14 anidulofungin > 10/15  10/20 ancef > 10/23 10/23 meropenem > >>>  10/21 anidulofungin> 10/22 10/22 voriconazole > 10/23 Merrem 10/28>>>10/31 Vanc 10/28>>>11/1 Ancef 10/31>>>  Interim history/subjective:   Decompensated overnight and required vent again Persistent airleak on CT  Objective   Blood pressure 104/70, pulse 81, temperature 98 F (36.7 C), temperature source Axillary, resp. rate (!) 30, height 5\' 10"  (1.778 m), weight 72.9 kg, SpO2 97 %.    Vent Mode: CPAP;PSV FiO2 (%):  [40 %] 40 % PEEP:  [5 cmH20] 5 cmH20 Pressure Support:  [8 cmH20] 8 cmH20 Plateau Pressure:  [12 cmH20] 12 cmH20   Intake/Output Summary (Last 24 hours) at 08/24/2019 0834 Last data filed at 08/24/2019 0800 Gross per 24 hour  Intake 2188.56 ml  Output 2370 ml  Net -181.44 ml  Filed Weights   08/22/19 0500 08/23/19 0441 08/24/19 0500  Weight: 82.7 kg 77.5 kg 72.9 kg   Examination:  General:  Chronically ill appearing, NAD HENT: [place]/AT, PERRL, EOM-I and MMM, trach in place PULM: Coarse BS diffusely, persistent airleak on CT CV: RRR,  Nl S1/S2 and -M/R/G GI: Soft, NT, ND and +BS MSK: -edema and -tenderness Neuro: Opens eyes and tracks but not following direct commands  I reviewed CXR myself, trach is in a good position  Discussed with TRH-MD  Resolved Hospital Problem list     Assessment & Plan:  ARDS due to COVID-19 pneumonia: improved oxygenation Ventilatory synchrony best with pressure control, not changed much with sedation PS then attempt TC again today Maintain TC as tolerated if able to D/C CRRT Titrate O2 for sat of 85-90% VAP prevention No need for daily CXR and ABG at this point  R Bronchopleural fistula, persistent air leak 10/26, bronchial blocker was up for over a week, fistula didn't heal so removed blocker to reduce likelihood of repeated episodes of pneumonia Suspect that endobronchial valve would be associated with more pneumonia like endobronchial blocker Likely necrotic RLL, unclear if fistula will heal Empyema R CT to suction, no valve Spoke with CVTS, not a VATS or valve candidate at this time D/C vanc Ancef F/U on cultures  Acute kidney injury: Metabolic alklalosis on ABG from 10/26 D/C CRRT Monitor for dialysis, if iHD is needed then will need to go to [facility] Monitor BMET and UOP Replace electrolytes as needed  Need for sedation/mechanical ventilation RASS target 0 to -1 Neuromuscular weakness Intermittent tachypnea: have seen this with many COVID patients, doesn't seem to be affected by medications RASS target 0 to -1 D/C fentanyl patch and keep PRN  Gastric ulcer: PPI  Anemia, acute hemorrhagic: Hgb down again on 10/23, spurious? Subcutaneous heparin for DVT prophylaxis  MSSA bacteremia Acinetobacter and Pseudomonal pneumonia> RLL necrotic? Continue meropenem for 7 days, re-assess at day 7 as may need more   PCCM will continue to follow  Best practice:  Diet: tube feeding Pain/Anxiety/Delirium protocol (if indicated): as above VAP protocol (if indicated): yes DVT  prophylaxis: prophylactic dosing heparin GI prophylaxis: pantoprazole Glucose control: SSI Mobility: bed rest Code Status: DNR Family Communication: I updated [name] today by phone Disposition: remain in ICU  Labs   CBC: Recent Labs  Lab 08/20/19 0400  08/21/19 0500 08/22/19 0305 08/22/19 0415 08/23/19 0415 08/23/19 0453 08/24/19 0515  WBC 8.7  --  10.4  --  8.5 7.8  --  9.2  NEUTROABS 7.2  --  8.3*  --  6.3 5.7  --  6.9  HGB 6.7*   < > 9.0* 8.2* 8.3* 8.3* 8.2* 8.7*  HCT 21.6*   < > 29.4* 24.0* 27.0* 27.1* 24.0* 28.6*  MCV 97.3  --  95.1  --  97.5 96.4  --  95.3  PLT 187  --  181  --  175 179  --  194   < > = values in this interval not displayed.    Basic Metabolic Panel: Recent Labs  Lab 08/21/19 0500  08/22/19 0415 08/22/19 1750 08/23/19 0415 08/23/19 0453 08/23/19 1550 08/24/19 0515  NA 138   < > 137 138 136 136 136 137  K 4.7   < > 5.0 5.9* 4.6 4.6 4.5 4.4  CL 102   < > 103 103 102  --  104 103  CO2 28   < > 28 25 26   --  25 26  GLUCOSE 147*   < > 140* 107* 124*  --  103* 117*  BUN 74*   < > 48* 56* 45*  --  41* 38*  CREATININE 3.00*   < > 2.37* 2.92* 2.01*  --  2.02* 1.60*  CALCIUM 8.6*   < > 8.4* 8.8* 8.5*  --  8.5* 8.7*  MG 2.4  --  2.5*  --  2.5*  --   --  2.6*  PHOS 5.0*   < > 3.9 7.1* 3.5  --  3.3 2.9   < > = values in this interval not displayed.   GFR: Estimated Creatinine Clearance: 46.8 mL/min (A) (by C-G formula based on SCr of 1.6 mg/dL (H)). Recent Labs  Lab 08/21/19 0500 08/22/19 0415 08/23/19 0415 08/24/19 0515  PROCALCITON 5.15 4.38  --   --   WBC 10.4 8.5 7.8 9.2    Liver Function Tests: Recent Labs  Lab 08/22/19 0415 08/22/19 1750 08/23/19 0415 08/23/19 1550 08/24/19 0515  ALBUMIN 2.1* 2.1* 2.3* 2.4* 2.5*   No results for input(s): LIPASE, AMYLASE in the last 168 hours. No results for input(s): AMMONIA in the last 168 hours.  ABG    Component Value Date/Time   PHART 7.401 08/23/2019 0453   PCO2ART 41.1 08/23/2019 0453    PO2ART 66.0 (L) 08/23/2019 0453   HCO3 25.7 08/23/2019 0453   TCO2 27 08/23/2019 0453   ACIDBASEDEF 2.0 08/14/2019 0515   O2SAT 93.0 08/23/2019 0453   Coagulation Profile: Recent Labs  Lab 08/22/19 1121  INR 1.0   Cardiac Enzymes: No results for input(s): CKTOTAL, CKMB, CKMBINDEX, TROPONINI in the last 168 hours.  HbA1C: Hgb A1c MFr Bld  Date/Time Value Ref Range Status  07/26/2019 05:00 AM 6.1 (H) 4.8 - 5.6 % Final    Comment:    (NOTE) Pre diabetes:          5.7%-6.4% Diabetes:              >6.4% Glycemic control for   <7.0% adults with diabetes    CBG: Recent Labs  Lab 08/23/19 1550 08/23/19 2110 08/24/19 0011 08/24/19 0326 08/24/19 0736  GLUCAP 104* 141* 129* 109* 125*   The patient is critically ill with multiple organ systems failure and requires high complexity decision making for assessment and support, frequent evaluation and titration of therapies, application of advanced monitoring technologies and extensive interpretation of multiple databases.   Critical Care Time devoted to patient care services described in this note is  32  Minutes. This time reflects time of care of this signee Dr Jennet Maduro. This critical care time does not reflect procedure time, or teaching time or supervisory time of PA/NP/Med student/Med Resident etc but could involve care discussion time.  Rush Farmer, M.D. [facility] Pulmonary/Critical Care Medicine.

## 2019-08-24 NOTE — Progress Notes (Signed)
[facility] Progress Note     Assessment/ Plan:    Acute kidney injury: on CKD w/ baseline creat of 1.5. Admitted with Covid pneumonia complicated course with recent history of MSSA bacteremia. Developed shock hypotension and required pressors. No evidence of hydronephrosis on renal ultrasound. Suspected ATN.There was administration of contrast but this was early on the course of his illness on 07/25/2019.Formetabolic acidosis was started on CRRT 08/07/2019. No signs renal of recovery at this time. ? Off pressors now x 4 days, hemodynamically stable ? S/p trach on vent , 40% FiO2 ? Wt's down ? Pt alert and Ox3 per notes ? Will dc CRRT, hold RRT for a few days see if pt will recover, if not iHD at [facility]    Anemia / sp GIB - transfuse as necessary. Started darbe/ esa at 60 ug weekly. Tsat is 33%.   COVID-19 pneumonia- Treated with remdesivir, dexamethasone and convalescent plasma  Afib / RVR - per CCM  GIB - acute, sp EGD 10/17 showed gastric ulcer/ w signs of recent bleeding rx'd w/ injection+ clipping.   VDRF/ R PTX w/ chest tube and air leak - will need trach possibly today  Shock - Off pressors today  MSSA bacteremia- treated  Acinetobacter and Pseudomonal pneumonia> RLL necrotic? plan per CCM is continue meropenem for 7 days, re-assess at day 7 as may need more   [name], MD 08/24/2019, 7:01 AM   Subjective:    Patient not examined directly given COVID-19 + status, utilizing exam of the primary team and observations of RN's.     Objective:   BP 110/82   Pulse 86   Temp 98 F (36.7 C) (Axillary)   Resp (!) 30   Ht 5\' 10"  (1.778 m)   Wt 72.9 kg   SpO2 99%   BMI 23.06 kg/m   Intake/Output Summary (Last 24 hours) at 08/24/2019 0701 Last data filed at 08/24/2019 0500 Gross per 24 hour  Intake 2053.56 ml  Output 2364 ml  Net -310.44 ml   Weight change: -4.6 kg  Physical Exam:  Patient not examined directly given COVID-19 + status,  utilizing exam of the primary team and observations of RN's.   Labs: BMET Recent Labs  Lab 08/21/19 0500 08/21/19 1558 08/22/19 0305 08/22/19 0415 08/22/19 1750 08/23/19 0415 08/23/19 0453 08/23/19 1550 08/24/19 0515  NA 138 138 137 137 138 136 136 136 137  K 4.7 5.0 5.0 5.0 5.9* 4.6 4.6 4.5 4.4  CL 102 104  --  103 103 102  --  104 103  CO2 28 27  --  28 25 26   --  25 26  GLUCOSE 147* 150*  --  140* 107* 124*  --  103* 117*  BUN 74* 61*  --  48* 56* 45*  --  41* 38*  CREATININE 3.00* 2.79*  --  2.37* 2.92* 2.01*  --  2.02* 1.60*  CALCIUM 8.6* 8.5*  --  8.4* 8.8* 8.5*  --  8.5* 8.7*  PHOS 5.0* 3.9  --  3.9 7.1* 3.5  --  3.3 2.9   CBC Recent Labs  Lab 08/21/19 0500  08/22/19 0415 08/23/19 0415 08/23/19 0453 08/24/19 0515  WBC 10.4  --  8.5 7.8  --  9.2  NEUTROABS 8.3*  --  6.3 5.7  --  PENDING  HGB 9.0*   < > 8.3* 8.3* 8.2* 8.7*  HCT 29.4*   < > 27.0* 27.1* 24.0* 28.6*  MCV 95.1  --  97.5 96.4  --  95.3  PLT 181  --  175 179  --  194   < > = values in this interval not displayed.    Medications:    . chlorhexidine  15 mL Mouth/Throat BID  . Chlorhexidine Gluconate Cloth  6 each Topical Daily  . darbepoetin (ARANESP) injection - NON-DIALYSIS  100 mcg Subcutaneous Q Wed-1800  . feeding supplement (PRO-STAT SUGAR FREE 64)  60 mL Per Tube TID  . heparin injection (subcutaneous)  7,500 Units Subcutaneous Q8H  . insulin aspart  0-9 Units Subcutaneous Q4H  . mouth rinse  15 mL Mouth Rinse 10 times per day  . pantoprazole (PROTONIX) IV  40 mg Intravenous Q12H  . polyethylene glycol  17 g Per Tube QHS  . propofol  500 mg Intravenous Once  . sennosides  5 mL Per Tube QHS  . sucralfate  1 g Oral Q6H

## 2019-08-24 NOTE — Plan of Care (Signed)
[name] remains in the [facility] ICU and is resting comfortably in bed.  Vent switched from trach collar 6L to PSV 40% FiO2 at 2015 last night and has been tolerating well, although tachypneic with RR in low 30s and occasionally in the mid 40s when awake and uncomfortable.  No issues with CRRT overnight.  No other acute events.  [name] is progressing very well and is following commands and tracking appropriately.  This morning he is fully alert and oriented when asked yes or no questions.  Still unable to move extremities, weak shoulder shrug.  PT/OT good next step for him.  See flowsheet for assessment details and MAR for medication administration. Will continue to monitor.     Problem: Education: Goal: Knowledge of risk factors and measures for prevention of condition will improve Outcome: Progressing   Problem: Respiratory: Goal: Will maintain a patent airway Outcome: Progressing Goal: Complications related to the disease process, condition or treatment will be avoided or minimized Outcome: Progressing   Problem: Education: Goal: Knowledge of General Education information will improve Description: Including pain rating scale, medication(s)/side effects and non-pharmacologic comfort measures Outcome: Progressing   Problem: Health Behavior/Discharge Planning: Goal: Ability to manage health-related needs will improve Outcome: Progressing   Problem: Clinical Measurements: Goal: Ability to maintain clinical measurements within normal limits will improve Outcome: Progressing Goal: Will remain free from infection Outcome: Progressing Goal: Diagnostic test results will improve Outcome: Progressing Goal: Respiratory complications will improve Outcome: Progressing Goal: Cardiovascular complication will be avoided Outcome: Progressing   Problem: Activity: Goal: Risk for activity intolerance will decrease Outcome: Progressing   Problem: Nutrition: Goal: Adequate nutrition  will be maintained Outcome: Progressing   Problem: Coping: Goal: Level of anxiety will decrease Outcome: Progressing   Problem: Elimination: Goal: Will not experience complications related to bowel motility Outcome: Progressing Goal: Will not experience complications related to urinary retention Outcome: Progressing   Problem: Pain Managment: Goal: General experience of comfort will improve Outcome: Progressing   Problem: Safety: Goal: Ability to remain free from injury will improve Outcome: Progressing   Problem: Skin Integrity: Goal: Risk for impaired skin integrity will decrease Outcome: Progressing

## 2019-08-24 NOTE — Progress Notes (Signed)
Spoke with pts wife [name].  Updated on pt status.  Currently resting in bed on trach collar, breathing on own.  Able to follow commands this AM.  Continuous dialysis stopped this AM.  Plan to transfer to [facility] tomorrow.  All questions answered.

## 2019-08-24 NOTE — Progress Notes (Signed)
Just received order to d/c CRRT.  Will let day shift verify and discontinue as ordered.   [facility] can be removed.  Infusion port of vascath can be used for antibiotics as long as it is in and vasopressor support if required.  Peripheral access will not be difficult to obtain if team wishes to remove [facility] CVC.

## 2019-08-24 NOTE — Progress Notes (Signed)
Called and spoke with patient's wife [name].  Updated her on patient's care and status.  All questions answered.

## 2019-08-25 DIAGNOSIS — I959 Hypotension, unspecified: Secondary | ICD-10-CM | POA: Diagnosis not present

## 2019-08-25 DIAGNOSIS — K250 Acute gastric ulcer with hemorrhage: Secondary | ICD-10-CM | POA: Diagnosis not present

## 2019-08-25 DIAGNOSIS — J1289 Other viral pneumonia: Secondary | ICD-10-CM | POA: Diagnosis not present

## 2019-08-25 DIAGNOSIS — J9601 Acute respiratory failure with hypoxia: Secondary | ICD-10-CM | POA: Diagnosis not present

## 2019-08-25 DIAGNOSIS — U071 COVID-19: Secondary | ICD-10-CM | POA: Diagnosis not present

## 2019-08-25 DIAGNOSIS — I69354 Hemiplegia and hemiparesis following cerebral infarction affecting left non-dominant side: Secondary | ICD-10-CM

## 2019-08-25 DIAGNOSIS — R579 Shock, unspecified: Secondary | ICD-10-CM | POA: Diagnosis not present

## 2019-08-25 DIAGNOSIS — R509 Fever, unspecified: Secondary | ICD-10-CM | POA: Diagnosis not present

## 2019-08-25 DIAGNOSIS — N179 Acute kidney failure, unspecified: Secondary | ICD-10-CM | POA: Diagnosis not present

## 2019-08-25 DIAGNOSIS — D649 Anemia, unspecified: Secondary | ICD-10-CM | POA: Diagnosis not present

## 2019-08-25 DIAGNOSIS — D62 Acute posthemorrhagic anemia: Secondary | ICD-10-CM | POA: Diagnosis not present

## 2019-08-25 DIAGNOSIS — A4901 Methicillin susceptible Staphylococcus aureus infection, unspecified site: Secondary | ICD-10-CM | POA: Diagnosis not present

## 2019-08-25 DIAGNOSIS — J80 Acute respiratory distress syndrome: Secondary | ICD-10-CM | POA: Diagnosis not present

## 2019-08-25 DIAGNOSIS — E872 Acidosis: Secondary | ICD-10-CM | POA: Diagnosis not present

## 2019-08-25 DIAGNOSIS — J9600 Acute respiratory failure, unspecified whether with hypoxia or hypercapnia: Secondary | ICD-10-CM | POA: Diagnosis not present

## 2019-08-25 HISTORY — DX: Hemiplegia and hemiparesis following cerebral infarction affecting left non-dominant side: I69.354

## 2019-08-25 LAB — CBC WITH DIFFERENTIAL/PLATELET
Abs Immature Granulocytes: 1 10*3/uL — ABNORMAL HIGH (ref 0.00–0.07)
Band Neutrophils: 3 %
Basophils Absolute: 0 10*3/uL (ref 0.0–0.1)
Basophils Relative: 0 %
Eosinophils Absolute: 0.2 10*3/uL (ref 0.0–0.5)
Eosinophils Relative: 2 %
HCT: 28 % — ABNORMAL LOW (ref 39.0–52.0)
Hemoglobin: 8.6 g/dL — ABNORMAL LOW (ref 13.0–17.0)
Lymphocytes Relative: 6 %
Lymphs Abs: 0.6 10*3/uL — ABNORMAL LOW (ref 0.7–4.0)
MCH: 29.6 pg (ref 26.0–34.0)
MCHC: 30.7 g/dL (ref 30.0–36.0)
MCV: 96.2 fL (ref 80.0–100.0)
Metamyelocytes Relative: 3 %
Monocytes Absolute: 0.3 10*3/uL (ref 0.1–1.0)
Monocytes Relative: 3 %
Myelocytes: 7 %
Neutro Abs: 8.1 10*3/uL — ABNORMAL HIGH (ref 1.7–7.7)
Neutrophils Relative %: 76 %
Platelets: 246 10*3/uL (ref 150–400)
RBC: 2.91 MIL/uL — ABNORMAL LOW (ref 4.22–5.81)
RDW: 16.4 % — ABNORMAL HIGH (ref 11.5–15.5)
WBC: 10.2 10*3/uL (ref 4.0–10.5)
nRBC: 4.4 % — ABNORMAL HIGH (ref 0.0–0.2)

## 2019-08-25 LAB — GLUCOSE, CAPILLARY
Glucose-Capillary: 104 mg/dL — ABNORMAL HIGH (ref 70–99)
Glucose-Capillary: 112 mg/dL — ABNORMAL HIGH (ref 70–99)
Glucose-Capillary: 113 mg/dL — ABNORMAL HIGH (ref 70–99)
Glucose-Capillary: 117 mg/dL — ABNORMAL HIGH (ref 70–99)
Glucose-Capillary: 130 mg/dL — ABNORMAL HIGH (ref 70–99)
Glucose-Capillary: 132 mg/dL — ABNORMAL HIGH (ref 70–99)
Glucose-Capillary: 132 mg/dL — ABNORMAL HIGH (ref 70–99)
Glucose-Capillary: 132 mg/dL — ABNORMAL HIGH (ref 70–99)

## 2019-08-25 LAB — BASIC METABOLIC PANEL
Anion gap: 13 (ref 5–15)
BUN: 100 mg/dL — ABNORMAL HIGH (ref 8–23)
CO2: 22 mmol/L (ref 22–32)
Calcium: 9.5 mg/dL (ref 8.9–10.3)
Chloride: 103 mmol/L (ref 98–111)
Creatinine, Ser: 4.02 mg/dL — ABNORMAL HIGH (ref 0.61–1.24)
GFR calc Af Amer: 17 mL/min — ABNORMAL LOW (ref >60)
GFR calc non Af Amer: 15 mL/min — ABNORMAL LOW (ref >60)
Glucose, Bld: 123 mg/dL — ABNORMAL HIGH (ref 70–99)
Potassium: 4.9 mmol/L (ref 3.5–5.1)
Sodium: 138 mmol/L (ref 135–145)

## 2019-08-25 LAB — CULTURE, BLOOD (ROUTINE X 2)
Culture: NO GROWTH
Culture: NO GROWTH
Special Requests: ADEQUATE
Special Requests: ADEQUATE

## 2019-08-25 LAB — SARS CORONAVIRUS 2 (TAT 6-24 HRS)
SARS Coronavirus 2: NEGATIVE
SARS Coronavirus 2: NEGATIVE

## 2019-08-25 LAB — PHOSPHORUS: Phosphorus: 5.7 mg/dL — ABNORMAL HIGH (ref 2.5–4.6)

## 2019-08-25 LAB — MAGNESIUM: Magnesium: 2.9 mg/dL — ABNORMAL HIGH (ref 1.7–2.4)

## 2019-08-25 LAB — PROCALCITONIN: Procalcitonin: 5.82 ng/mL

## 2019-08-25 MED ORDER — CHLORHEXIDINE GLUCONATE CLOTH 2 % EX PADS: 6.0000 | MEDICATED_PAD | Freq: Every day | CUTANEOUS | Status: DC

## 2019-08-25 MED ORDER — GERHARDT'S BUTT CREAM
TOPICAL_CREAM | Freq: Three times a day (TID) | CUTANEOUS | Status: AC
  Administered 2019-08-25 (×2): via TOPICAL
  Administered 2019-08-26 (×3): 1 via TOPICAL
  Administered 2019-08-26: via TOPICAL
  Administered 2019-08-27 (×2): 1 via TOPICAL
  Administered 2019-08-27: 21:00:00 via TOPICAL
  Administered 2019-08-28: 1 via TOPICAL
  Administered 2019-08-28 – 2019-09-01 (×13): via TOPICAL
  Administered 2019-09-01: 1 via TOPICAL
  Administered 2019-09-02: 22:00:00 via TOPICAL
  Administered 2019-09-02: 1 via TOPICAL
  Administered 2019-09-02 – 2019-09-04 (×6): via TOPICAL
  Administered 2019-09-04: 1 via TOPICAL
  Administered 2019-09-05 – 2019-09-08 (×9): via TOPICAL
  Administered 2019-09-08: 1 via TOPICAL
  Administered 2019-09-08 – 2019-09-15 (×17): via TOPICAL
  Filled 2019-08-25 (×3): qty 1

## 2019-08-25 MED ORDER — POLYETHYLENE GLYCOL 3350 17 G PO PACK
17.0000 g | PACK | Freq: Every day | ORAL | Status: DC | PRN
  Administered 2019-09-28 – 2019-09-29 (×2): 17 g
  Filled 2019-08-25 (×2): qty 1

## 2019-08-25 NOTE — Consult Note (Addendum)
[place] Nurse wound consult note I am assisted in this consultation by Bedside RN, Theodosia Quay. His flexibility and expertise are appreciated. Reason for Consult: Medical device related pressure injury to lip (DTPI), partial thickness tissue loss at posterior aspect of penile shaft, sacral deep tissue pressure injury (DTPI) Wound type: Pressure, moisture Pressure Injury POA: N/A Measurement: Lower lip, center orientation:  Deep Tissue Pressure Injury (DTPI):  1.5cm x 0.5cm area of purple discoloration Partial thickness area of skin loss at posterior aspect of penile shaft, two areas in area  measuring 1cm x 1cm x 0.1cm, pink, moist Sacrum/coccygeal area at gluteal cleft:  6cm x 14cm area of purple discoloration, intact  Wound bed: As noted above Drainage (amount, consistency, odor) scant serous from posterior penile shaft. Periwound: intact, dry Dressing procedure/placement/frequency: Patient to be placed on a mattress replacement with low air loss feature, bilateral Prevalon Boots applied today.  Sacral/coccygeal area at gluteal cleft DTPI to be protected/treated with a silicone foam dressing peeled back to assess area twice daily.  The lip to be moisturized with white petrolatum three times daily.  The penile lesions are to be treated with application of moisture barrier ointment (Gerhart's Butt Cream) three times daily. Turning and repositioning from side to side is in place and will continue.  [place] nursing team will follow, and will remain available to this patient, the nursing and medical teams.   Thanks, Maudie Flakes, MSN, RN, [place], Arther Abbott  Pager# [phone]

## 2019-08-25 NOTE — Progress Notes (Signed)
Spoke with patient's wife, [name], and provided full update/answered all questions.

## 2019-08-25 NOTE — Plan of Care (Signed)
  Problem: Respiratory: Goal: Will maintain a patent airway Outcome: Progressing   Problem: Respiratory: Goal: Complications related to the disease process, condition or treatment will be avoided or minimized Outcome: Progressing   Problem: Health Behavior/Discharge Planning: Goal: Ability to manage health-related needs will improve Outcome: Progressing   Problem: Clinical Measurements: Goal: Diagnostic test results will improve Outcome: Progressing   Problem: Clinical Measurements: Goal: Respiratory complications will improve Outcome: Progressing   Problem: Clinical Measurements: Goal: Cardiovascular complication will be avoided Outcome: Progressing   Problem: Nutrition: Goal: Adequate nutrition will be maintained Outcome: Progressing   Problem: Pain Managment: Goal: General experience of comfort will improve Outcome: Progressing   Problem: Safety: Goal: Ability to remain free from injury will improve Outcome: Progressing   Problem: Skin Integrity: Goal: Risk for impaired skin integrity will decrease Outcome: Progressing   Problem: Education: Goal: Knowledge of risk factors and measures for prevention of condition will improve Outcome: Not Progressing   Problem: Education: Goal: Knowledge of General Education information will improve Description: Including pain rating scale, medication(s)/side effects and non-pharmacologic comfort measures Outcome: Not Progressing   Problem: Clinical Measurements: Goal: Will remain free from infection Outcome: Not Progressing   Problem: Activity: Goal: Risk for activity intolerance will decrease Outcome: Not Progressing   Problem: Coping: Goal: Level of anxiety will decrease Outcome: Not Progressing   Problem: Elimination: Goal: Will not experience complications related to bowel motility Outcome: Not Progressing Goal: Will not experience complications related to urinary retention Outcome: Not Progressing

## 2019-08-25 NOTE — Progress Notes (Signed)
[facility] at bedside to transport pt to [facility]. Pt saline locked for transport. Tube feedings held for transport, Cortrak flushed. CT placed to water seal.  Report given to transport team. Airway supplies and pt belongings with pt, including glasses and gold necklace.

## 2019-08-25 NOTE — Progress Notes (Signed)
Patient ID: Swanson Farnell, male   DOB: 1951/04/27, 68 y.o.   MRN: [id number] S: No significant UOP overnight O:BP 102/61   Pulse (!) 103   Temp 98.8 F (37.1 C) (Axillary)   Resp (!) 29   Ht 5\' 10"  (1.778 m)   Wt 72.9 kg   SpO2 99%   BMI 23.06 kg/m   Intake/Output Summary (Last 24 hours) at 08/25/2019 1200 Last data filed at 08/25/2019 1140 Gross per 24 hour  Intake 1836.82 ml  Output 275 ml  Net 1561.82 ml   Intake/Output: I/O last 3 completed shifts: In: 3113.1 [I.V.:183.1; NG/GT:2630; IV [id number]:379] Out: 1933 [Other:1133; Stool:700; Chest Tube:100]  Intake/Output this shift:  Total I/O In: 442.2 [I.V.:22.2; Other:200; NG/GT:220] Out: 0  Weight change:  Gen: on vent via Trach CVS: no rub, tachy Resp: decreased BS Abd: +BS, soft, NT Ext: no edema  Recent Labs  Lab 08/21/19 0455  08/21/19 1558  08/22/19 0415 08/22/19 1750 08/23/19 0415 08/23/19 0453 08/23/19 1550 08/24/19 0515 08/25/19 0517  NA 139   < > 138   < > 137 138 136 136 136 137 138  K 4.7   < > 5.0   < > 5.0 5.9* 4.6 4.6 4.5 4.4 4.9  CL 100   < > 104  --  103 103 102  --  104 103 103  CO2 29   < > 27  --  28 25 26   --  25 26 22   GLUCOSE 155*   < > 150*  --  140* 107* 124*  --  103* 117* 123*  BUN 79*   < > 61*  --  48* 56* 45*  --  41* 38* 100*  CREATININE 3.10*   < > 2.79*  --  2.37* 2.92* 2.01*  --  2.02* 1.60* 4.02*  ALBUMIN 2.2*  --  2.2*  --  2.1* 2.1* 2.3*  --  2.4* 2.5*  --   CALCIUM 8.5*   < > 8.5*  --  8.4* 8.8* 8.5*  --  8.5* 8.7* 9.5  PHOS 4.8*   < > 3.9  --  3.9 7.1* 3.5  --  3.3 2.9 5.7*   < > = values in this interval not displayed.   Liver Function Tests: Recent Labs  Lab 08/23/19 0415 08/23/19 1550 08/24/19 0515  ALBUMIN 2.3* 2.4* 2.5*   No results for input(s): LIPASE, AMYLASE in the last 168 hours. No results for input(s): AMMONIA in the last 168 hours. CBC: Recent Labs  Lab 08/21/19 0500  08/22/19 0415 08/23/19 0415 08/23/19 0453 08/24/19 0515 08/25/19 0517  WBC  10.4  --  8.5 7.8  --  9.2 10.2  NEUTROABS 8.3*  --  6.3 5.7  --  6.9 8.1*  HGB 9.0*   < > 8.3* 8.3* 8.2* 8.7* 8.6*  HCT 29.4*   < > 27.0* 27.1* 24.0* 28.6* 28.0*  MCV 95.1  --  97.5 96.4  --  95.3 96.2  PLT 181  --  175 179  --  194 246   < > = values in this interval not displayed.   Cardiac Enzymes: No results for input(s): CKTOTAL, CKMB, CKMBINDEX, TROPONINI in the last 168 hours. CBG: Recent Labs  Lab 08/24/19 2047 08/25/19 0015 08/25/19 0431 08/25/19 0749 08/25/19 1129  GLUCAP 122* 113* 132* 112* 130*    Iron Studies: No results for input(s): IRON, TIBC, TRANSFERRIN, FERRITIN in the last 72 hours. Studies/Results: No results found. . chlorhexidine  15 mL  Mouth/Throat BID  . Chlorhexidine Gluconate Cloth  6 each Topical Daily  . darbepoetin (ARANESP) injection - NON-DIALYSIS  100 mcg Subcutaneous Q Wed-1800  . feeding supplement (PRO-STAT SUGAR FREE 64)  60 mL Per Tube TID  . heparin injection (subcutaneous)  7,500 Units Subcutaneous Q8H  . insulin aspart  0-9 Units Subcutaneous Q4H  . mouth rinse  15 mL Mouth Rinse 10 times per day  . pantoprazole (PROTONIX) IV  40 mg Intravenous Q12H  . polyethylene glycol  17 g Per Tube QHS  . sennosides  5 mL Per Tube QHS  . sucralfate  1 g Oral Q6H    BMET    Component Value Date/Time   NA 138 08/25/2019 0517   NA 143 11/08/2017 1030   K 4.9 08/25/2019 0517   CL 103 08/25/2019 0517   CO2 22 08/25/2019 0517   GLUCOSE 123 (H) 08/25/2019 0517   BUN 100 (H) 08/25/2019 0517   BUN 8 11/08/2017 1030   CREATININE 4.02 (H) 08/25/2019 0517   CREATININE 1.15 11/20/2014 1224   CALCIUM 9.5 08/25/2019 0517   GFRNONAA 15 (L) 08/25/2019 0517   GFRAA 17 (L) 08/25/2019 0517   CBC    Component Value Date/Time   WBC 10.2 08/25/2019 0517   RBC 2.91 (L) 08/25/2019 0517   HGB 8.6 (L) 08/25/2019 0517   HGB 15.8 11/08/2017 1030   HCT 28.0 (L) 08/25/2019 0517   HCT 46.6 11/08/2017 1030   PLT 246 08/25/2019 0517   PLT 202 11/08/2017  1030   MCV 96.2 08/25/2019 0517   MCV 93 11/08/2017 1030   MCH 29.6 08/25/2019 0517   MCHC 30.7 08/25/2019 0517   RDW 16.4 (H) 08/25/2019 0517   RDW 14.2 11/08/2017 1030   LYMPHSABS 0.6 (L) 08/25/2019 0517   LYMPHSABS 1.1 11/08/2017 1030   MONOABS 0.3 08/25/2019 0517   EOSABS 0.2 08/25/2019 0517   EOSABS 0.1 11/08/2017 1030   BASOSABS 0.0 08/25/2019 0517   BASOSABS 0.0 11/08/2017 1030     Assessment/Plan:  1. AKI/CKD stage 3- in setting of covid PNA and has remained oliguric/anuric.  Started on CVVHD 08/07/19-08/24/19.  He is off pressors but remains anuric with rising BUN/Cr.  Will need to transfer to [facility] for IHD 2. COVID PNA- treated with remdesivir, dexamethasone, and convalescent plasma.  Most recent covid test was negative. 3. VDRF - s/p trach and on vent per PCCM 4. Sepsis/shock- off of pressors 5. Right pneumothorax/emphyema/necrotic RLL- s/p chest tube with air leak. Per PCCM 6. A fib/RVR- per PCCM 7. GIB- acute s/p EGD which showed gastric ulcer with stigmata of recent bleeding s/p injection and clipping.  Able to tolerate low dose heparin with CVVHD before stopping it. 8. MSSA bacteremia- treated 9. Acinetobacter and pseudomonal pna- per PCCM, meropenem for 7 days. 10. Disposition- recommend transfer to [facility] for IHD.  Donetta Potts, MD [employer] [phone]

## 2019-08-25 NOTE — Progress Notes (Signed)
Spoke with patient's wife, [name], and updated her on patient's transfer to 43[facility] at [facility].

## 2019-08-25 NOTE — Progress Notes (Signed)
PT Cancellation Note  Patient Details Name: [name] MRN: [id number] DOB: December 24, 1950   Cancelled Treatment:    Reason Eval/Treat Not Completed: Fatigue/lethargy limiting ability to participatePT will initiate eval 11/3.    Claretha Cooper 08/25/2019, 1:57 PM [place]  Office [phone]

## 2019-08-25 NOTE — Progress Notes (Signed)
NAME:  [name], MRN:  [id number], DOB:  1951/05/17, LOS: 54 ADMISSION DATE:  07/25/2019, CONSULTATION DATE:  10/3 REFERRING MD:  Nadara Mustard, CHIEF COMPLAINT:  Dyspnea   Brief History   68 y/o male diagnosed with COVID on 9/29 presented to the [facility] ED on 10/2 with dyspnea, hypoxemia requiring intubation.  Admitted to [facility], developed AKI requiring CRRT, pneumothorax requiring chest tube and had a large bronchopleural fistula treated for 10 days with a bronchial blocker.  Tracheostomy placed on 10/30.   Past Medical History  GERD  Significant Hospital Events   10/03 Admit to [facility] from [facility] ER, start decadron and remdesivir, given tociluzimab; prone positioning 10/04 convalescent plasma 10/05 stop prone positioning 10/06 convalescent plasma; prone positioning again 10/09 start ABx 10/10 MSSA bacteremia, CVL d/ced; ID consulted; increase OG tube outpt 10/11 vent weaning trial started 10/13 fever, pneumothorax, pig tail chest tube placed 10/14 worsening hypotension and renal fx >> consulted nephrology; worsening PTX >> replaced chest tube; GNR in sputum >> ABx changed 10/15 start CRRT; endobronchial blocker placed for persistent air leak 10/16 endobronchial blocker repositioned, changed chest tube to water seal; melana with ABLA >> GI consulted; transfuse PRBC 10/17 persistent air leak, increased WOB; start nimbex gtt >> air leak decreased; EGD; A fib with RVR >> start amiodarone 10/18 transfuse PRBC; GI s/o 10/20 resume heparin gtt 10/21 stopped nimbex, chest tube to suction, stopped heparin drip because Hgb dropped again 10/22 TPA in chest tube, repositioned endobronchial blocker 10/23 deflated endobronchial balloon 10/24 small air leak  10/25 removed bronchial blocker 10/30 tracheostomy  11/1 D/C CRRT  Consults:  ID Nephrology Gastroenterology PCCM  Procedures:  10/2 ETT>10/30  10/2 R IJ CVL > 10/10 10/13 Rt pig tail chest tube >> 10/14 10/14 R 33F chest tube > 10/14 R  subclavian CVL > 10/14 L IJ HD cath > 10/14 Lt radial a line > 10/30 tracheostomy >   Significant Diagnostic Tests:  10/2 CT angiogram chest > extensive bilateral airspace disease predominantly posterior 10/8 doppler legs b/l > no DVT 10/15 renal u/s > normal 10/17 EGD > non bleeding gastric ulcer 10/19 Echo >> EF 60 to 65%, hyperdynamic LV with small LVOT gradient 10/22 CT chest > large collection of air in pleural space with fluid, pneumonia bilaterally R>L endobronchial blocker in place, chest tube anterior  Micro Data:  9/20 SARS COV 2 > POSITIVE 10/2 blood > negative 10/9 blood > MSSA 2/4 10/9 resp > MSSA, Pseudomonas 10/13 blood >> negative 10/21 bronch wash bacterial > pseduomonas, acinetobacter baumannii 10/21 bronch wash fungal >  10/21 bronch wash aspergillus antigen> negative 10/24 bronchoscopy wash> pseudomonas 10/28 Blood cx>>> 10/28 Sputum cx>>> Pseudomonas 11/1 SARS COV 2 > negative  Antimicrobials:  10/3 remdesivir > 10/6 10/3 actemra 10/3 decadron > 10/12 10/4 convalescent plasma 10/6 convalescent plasma  10/9 vancomycin > 10/10 10/9 cefepime > 10/10 10/10 ancef > 10/13 10/13 cefepime >10/20 10/14 anidulofungin > 10/15  10/20 ancef > 10/23 10/23 meropenem > >>>  10/21 anidulofungin> 10/22 10/22 voriconazole > 10/23 Merrem 10/28>>>10/31 Vanc 10/28>>>11/1 Ancef 10/31>>>  Interim history/subjective:   On trach collar this morning Has been on trach collar since 11/1 AM Stopped CRRT on 11/1  Objective   Blood pressure (!) 87/52, pulse (!) 106, temperature 98.9 F (37.2 C), temperature source Oral, resp. rate (!) 28, height 5\' 10"  (1.778 m), weight 72.9 kg, SpO2 98 %.    Vent Mode: CPAP;PSV FiO2 (%):  [40 %-50 %] 50 % PEEP:  [  5 cmH20] 5 cmH20 Pressure Support:  [8 cmH20] 8 cmH20   Intake/Output Summary (Last 24 hours) at 08/25/2019 0741 Last data filed at 08/25/2019 0700 Gross per 24 hour  Intake 2134.57 ml  Output 573 ml  Net  1561.57 ml   Filed Weights   08/22/19 0500 08/23/19 0441 08/24/19 0500  Weight: 82.7 kg 77.5 kg 72.9 kg    Examination:  General:  In bed on vent HENT: NCAT tracheostomy in place PULM: CTA B, vent supported breathing CV: RRR, no mgr GI: BS+, soft, nontender MSK: normal bulk and tone Neuro: will open eyes to voice, doesn't move extremities   Resolved Hospital Problem list     Assessment & Plan:  Acute respiratory failure with hypoxemia, ventilator dependence: improved Monitor carefully on trach collar in ICU setting Attempt 24 hours again off vent today Trach care per routine Continue pulmonary toilette per protocol  Bronchopleural fistula Continue chest tube to suction Consider thoracic surgery intervention when mental status/neuromuscular strength improves  AKI: no evidence of recovery Move to [facility] for intermittent hemodialysis  Neuromuscular weakness PT consult, range of motion exercises  Best practice:  Diet: tube feeding Pain/Anxiety/Delirium protocol (if indicated):  VAP protocol (if indicated): yes DVT prophylaxis: sub cutaneous heparin GI prophylaxis: Pantoprazole for stress ulcer prophylaxis Glucose control: SSI Mobility: PT  Code Status: DNR if arrests Family Communication: Aneta Mins for an update on 11/2, she voiced understanding Disposition: remain in ICU  Labs   CBC: Recent Labs  Lab 08/21/19 0500  08/22/19 0415 08/23/19 0415 08/23/19 0453 08/24/19 0515 08/25/19 0517  WBC 10.4  --  8.5 7.8  --  9.2 10.2  NEUTROABS 8.3*  --  6.3 5.7  --  6.9 8.1*  HGB 9.0*   < > 8.3* 8.3* 8.2* 8.7* 8.6*  HCT 29.4*   < > 27.0* 27.1* 24.0* 28.6* 28.0*  MCV 95.1  --  97.5 96.4  --  95.3 96.2  PLT 181  --  175 179  --  194 246   < > = values in this interval not displayed.    Basic Metabolic Panel: Recent Labs  Lab 08/21/19 0500  08/22/19 0415 08/22/19 1750 08/23/19 0415 08/23/19 0453 08/23/19 1550 08/24/19 0515  NA 138   < > 137 138 136  136 136 137  K 4.7   < > 5.0 5.9* 4.6 4.6 4.5 4.4  CL 102   < > 103 103 102  --  104 103  CO2 28   < > 28 25 26   --  25 26  GLUCOSE 147*   < > 140* 107* 124*  --  103* 117*  BUN 74*   < > 48* 56* 45*  --  41* 38*  CREATININE 3.00*   < > 2.37* 2.92* 2.01*  --  2.02* 1.60*  CALCIUM 8.6*   < > 8.4* 8.8* 8.5*  --  8.5* 8.7*  MG 2.4  --  2.5*  --  2.5*  --   --  2.6*  PHOS 5.0*   < > 3.9 7.1* 3.5  --  3.3 2.9   < > = values in this interval not displayed.   GFR: Estimated Creatinine Clearance: 46.8 mL/min (A) (by C-G formula based on SCr of 1.6 mg/dL (H)). Recent Labs  Lab 08/21/19 0500 08/22/19 0415 08/23/19 0415 08/24/19 0515 08/25/19 0517  PROCALCITON 5.15 4.38  --   --   --   WBC 10.4 8.5 7.8 9.2 10.2  Liver Function Tests: Recent Labs  Lab 08/22/19 0415 08/22/19 1750 08/23/19 0415 08/23/19 1550 08/24/19 0515  ALBUMIN 2.1* 2.1* 2.3* 2.4* 2.5*   No results for input(s): LIPASE, AMYLASE in the last 168 hours. No results for input(s): AMMONIA in the last 168 hours.  ABG    Component Value Date/Time   PHART 7.401 08/23/2019 0453   PCO2ART 41.1 08/23/2019 0453   PO2ART 66.0 (L) 08/23/2019 0453   HCO3 25.7 08/23/2019 0453   TCO2 27 08/23/2019 0453   ACIDBASEDEF 2.0 08/14/2019 0515   O2SAT 93.0 08/23/2019 0453     Coagulation Profile: Recent Labs  Lab 08/22/19 1121  INR 1.0    Cardiac Enzymes: No results for input(s): CKTOTAL, CKMB, CKMBINDEX, TROPONINI in the last 168 hours.  HbA1C: Hgb A1c MFr Bld  Date/Time Value Ref Range Status  07/26/2019 05:00 AM 6.1 (H) 4.8 - 5.6 % Final    Comment:    (NOTE) Pre diabetes:          5.7%-6.4% Diabetes:              >6.4% Glycemic control for   <7.0% adults with diabetes     CBG: Recent Labs  Lab 08/24/19 1201 08/24/19 1617 08/24/19 2047 08/25/19 0015 08/25/19 0431  GLUCAP 119* 138* 122* 113* 132*     Critical care time: 35 minutes    Roselie Awkward, MD [place] [facility] Pager: [phone] Cell:  [phone] If no response, call [phone]

## 2019-08-25 NOTE — Progress Notes (Signed)
PROGRESS NOTE  [name] EAV:[id number] DOB: [birth year] DOA: 07/25/2019  PCP: Kathyrn Drown, MD  Brief History/Interval Summary: 68 year old with a history of GERD and BPH who presented to [facility] ED with S OB and was found to be hypoxic.  He was initially diagnosed with COVID-19 September 20.  1 to 2 days prior to his presentation he developed worsening shortness of breath with anorexia.  His wife's physician during a teleconference call noted that the patient himself is very tachypneic and ultimately EMS was sent to the house.  EMS found the patient to have saturations in the 40s.  In the ED on nonrebreather the patient sats only improved to the 80s.  The patient was severely confused and required emergent intubation.  Chest x-ray confirmed multifocal infiltrates.  CT chest was negative for PE.  Reason for Visit: Acute respiratory failure with hypoxia.  Pneumonia due to COVID-19.  Significant Events: 10/3 admit to [facility] from [facility] ED 10/13 right pneumothorax -pigtail chest tube placed 10/14 Nephrology consulted 10/15 begin CRRT -endobronchial blocker placed 10/16 melena 10/17 A. fib with RVR -EGD 10/18 transfuse PRBC 10/20 Heparin drip resumed 10/21 abrupt drop in hemoglobin -transfuse 1 unit PRBC 10/23 tPA per chest tube  10/25 endobronchial blocker removed  10/26 1U PRBC 10/28: 2 units of PRBC transfused  Consultants: Pulmonology.  Nephrology.  Gastroenterology.  Procedures:  EGD 10/17:   Impression:               - No active bleeding or blood or coffee ground                            material in the upper tract at the time of this                            exam.                           - Normal esophagus.                           - Excessive gastric fluid (residual tube feeding)                            without anatomic gastric outlet obstruction. Fluid                            aspiration performed. The stomach was somewhat    dilated and patulous, causing looping of the                            endoscope along the greater curve during                            advancement, making it difficult to traverse the                            pylorus, although this was eventually successfully                            accomplished.                           -  Non-bleeding gastric ulcer with pigmented                            material suggesting stigma of recent hemorrhage.                            Injected with good blanching. Clips were placed,                            effectively sealing off the ulcer.                           - Normal examined duodenum.  Tracheostomy on 10/30  Antibiotics: Anti-infectives (From admission, onward)   Start     Dose/Rate Route Frequency Ordered Stop   08/26/19 1000  ceFAZolin (ANCEF) IVPB 1 g/50 mL premix     1 g 100 mL/hr over 30 Minutes Intravenous Every 24 hours 08/24/19 1021     08/24/19 1100  meropenem (MERREM) 1 g in sodium chloride 0.9 % 100 mL IVPB     1 g 200 mL/hr over 30 Minutes Intravenous Every 24 hours 08/24/19 1021 08/26/19 1059   08/23/19 1100  ceFAZolin (ANCEF) IVPB 2g/100 mL premix  Status:  Discontinued     2 g 200 mL/hr over 30 Minutes Intravenous Every 12 hours 08/23/19 1048 08/24/19 1021   08/21/19 1700  vancomycin (VANCOCIN) IVPB 750 mg/150 ml premix  Status:  Discontinued     750 mg 150 mL/hr over 60 Minutes Intravenous Every 24 hours 08/21/19 0843 08/23/19 1048   08/21/19 1400  vancomycin (VANCOCIN) IVPB 750 mg/150 ml premix  Status:  Discontinued     750 mg 150 mL/hr over 60 Minutes Intravenous Every 24 hours 08/20/19 1146 08/20/19 1218   08/21/19 1000  meropenem (MERREM) 1 g in sodium chloride 0.9 % 100 mL IVPB  Status:  Discontinued     1 g 200 mL/hr over 30 Minutes Intravenous Every 12 hours 08/20/19 1535 08/23/19 1048   08/21/19 0413  meropenem (MERREM) 500 mg in sodium chloride 0.9 % 100 mL IVPB  Status:  Discontinued     500 mg 200  mL/hr over 30 Minutes Intravenous Every 24 hours 08/20/19 1245 08/20/19 1535   08/20/19 1330  vancomycin (VANCOCIN) 1,750 mg in sodium chloride 0.9 % 500 mL IVPB     1,750 mg 250 mL/hr over 120 Minutes Intravenous  Once 08/20/19 1222 08/20/19 1619   08/20/19 1300  vancomycin (VANCOCIN) 1,250 mg in sodium chloride 0.9 % 250 mL IVPB  Status:  Discontinued     1,250 mg 166.7 mL/hr over 90 Minutes Intravenous  Once 08/20/19 1146 08/20/19 1218   08/20/19 1224  vancomycin variable dose per unstable renal function (pharmacist dosing)  Status:  Discontinued      Does not apply See admin instructions 08/20/19 1225 08/21/19 1152   08/15/19 1600  meropenem (MERREM) 1 g in sodium chloride 0.9 % 100 mL IVPB  Status:  Discontinued     1 g 200 mL/hr over 30 Minutes Intravenous Every 12 hours 08/15/19 1543 08/20/19 1245   08/15/19 1000  voriconazole (VFEND) tablet 200 mg  Status:  Discontinued     200 mg Per Tube Every 12 hours 08/14/19 1148 08/15/19 1134   08/14/19 1600  anidulafungin (ERAXIS) 100 mg in sodium chloride 0.9 % 100 mL  IVPB  Status:  Discontinued     100 mg 78 mL/hr over 100 Minutes Intravenous Every 24 hours 08/13/19 1645 08/14/19 1148   08/14/19 1300  voriconazole (VFEND) tablet 400 mg     400 mg Per Tube Every 12 hours 08/14/19 1148 08/14/19 2217   08/13/19 1700  anidulafungin (ERAXIS) 200 mg in sodium chloride 0.9 % 200 mL IVPB     200 mg 78 mL/hr over 200 Minutes Intravenous  Once 08/13/19 1645 08/13/19 2030   08/13/19 0200  ceFAZolin (ANCEF) IVPB 2g/100 mL premix  Status:  Discontinued     2 g 200 mL/hr over 30 Minutes Intravenous Every 12 hours 08/11/19 1512 08/15/19 1543   08/12/19 0200  ceFEPIme (MAXIPIME) 2 g in sodium chloride 0.9 % 100 mL IVPB     2 g 200 mL/hr over 30 Minutes Intravenous Every 12 hours 08/11/19 1509 08/12/19 1341   08/07/19 2200  ceFEPIme (MAXIPIME) 2 g in sodium chloride 0.9 % 100 mL IVPB  Status:  Discontinued     2 g 200 mL/hr over 30 Minutes Intravenous  Every 12 hours 08/07/19 1540 08/11/19 1509   08/07/19 1000  anidulafungin (ERAXIS) 100 mg in sodium chloride 0.9 % 100 mL IVPB  Status:  Discontinued     100 mg 78 mL/hr over 100 Minutes Intravenous Every 24 hours 08/06/19 0934 08/08/19 1203   08/07/19 0800  ceFEPIme (MAXIPIME) 2 g in sodium chloride 0.9 % 100 mL IVPB  Status:  Discontinued     2 g 200 mL/hr over 30 Minutes Intravenous Every 24 hours 08/06/19 1036 08/07/19 1540   08/06/19 1000  anidulafungin (ERAXIS) 200 mg in sodium chloride 0.9 % 200 mL IVPB    Note to Pharmacy: please   200 mg 78 mL/hr over 200 Minutes Intravenous Every 24 hours 08/06/19 0925 08/06/19 1600   08/06/19 0800  ceFEPIme (MAXIPIME) 2 g in sodium chloride 0.9 % 100 mL IVPB  Status:  Discontinued     2 g 200 mL/hr over 30 Minutes Intravenous Every 12 hours 08/06/19 0733 08/06/19 1036   08/02/19 2200  ceFAZolin (ANCEF) IVPB 2g/100 mL premix  Status:  Discontinued     2 g 200 mL/hr over 30 Minutes Intravenous Every 8 hours 08/02/19 1338 08/06/19 0733   08/02/19 1400  vancomycin (VANCOCIN) 1,250 mg in sodium chloride 0.9 % 250 mL IVPB  Status:  Discontinued     1,250 mg 166.7 mL/hr over 90 Minutes Intravenous Every 24 hours 08/01/19 1320 08/02/19 1332   08/01/19 1330  vancomycin (VANCOCIN) 2,000 mg in sodium chloride 0.9 % 500 mL IVPB     2,000 mg 250 mL/hr over 120 Minutes Intravenous  Once 08/01/19 1158 08/01/19 1636   08/01/19 1300  ceFEPIme (MAXIPIME) 2 g in sodium chloride 0.9 % 100 mL IVPB  Status:  Discontinued     2 g 200 mL/hr over 30 Minutes Intravenous Every 12 hours 08/01/19 1158 08/02/19 1332   07/26/19 1600  remdesivir 100 mg in sodium chloride 0.9 % 250 mL IVPB     100 mg 500 mL/hr over 30 Minutes Intravenous Every 24 hours 07/26/19 0132 07/29/19 1823   07/26/19 0215  remdesivir 200 mg in sodium chloride 0.9 % 250 mL IVPB     200 mg 500 mL/hr over 30 Minutes Intravenous Once 07/26/19 0132 07/26/19 0520      Subjective/Interval History:  Patient slow to respond.  Does open his eyes when his name is called.  However not following other  instructions today.    Assessment/Plan:  Acute Hypoxic Resp. Failure/Pneumonia due to COVID-19   Lab Results  Component Value Date   SARSCOV2NAA NEGATIVE 08/24/2019   SARSCOV2NAA Detected (A) 07/22/2019   COVID-19 specific Treatment: Decadron 10/2 > 10/12 Remdesivir 10/2 > 10/6 Actemra 10/2 Convalescent plasma 10/3  Patient underwent tracheostomy on 10/30.  He does well on trach collar for most of the day but has required ventilator support as well.  Pulmonology is following and managing.    Patient has been weaned off of Levophed.  Patient has other pulmonary issues as discussed below.    From a COVID-19 standpoint he has completed course of remdesivir and steroids.  He also received Actemra and convalescent plasma.  PT and OT evaluation.  COVID-19 test repeated on 11/1 and is noted to be negative.  Will need to order repeat test.  Concern for left-sided weakness Patient has been in the hospital for more than 30 days most of which time he has been on the ventilator.  It is very hard to know if patient has a true neurological deficits or if he is just deconditioned.  There was concern that he was not moving his left side but he is generally not moving any of his extremities.  Ideally we would like to MRI of his brain but there is no urgent need to do the same.  Since the plan is for him to be transferred to [facility] for consideration of intermittent hemodialysis this can wait till then.  We will for order MRI brain once he has been transferred to [facility].  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  Bedside bronchoscopy revealed material in the airway.  TPA infused via chest tube on 10/23.  Chest tube management per pulmonology.  Endobronchial blocker  removed on 10/25.    Fever Patient was noted to have fever on 10/27 on 10/28.  WBC was noted to be normal.  Blood cultures were repeated.  Sputum cultures were repeated.  Patient was on antibiotics including meropenem for Pseudomonas in BAL specimen as discussed below.  Patient was started on vancomycin.  Fever has subsided.  Procalcitonin levels remain elevated.  Noted to be 5.82 today.  Patient was transitioned from vancomycin to cefazolin for a 24-day course beginning October 31.  This was discussed apparently with ID.  WBC is normal.  He has had low-grade fever.  Continue to monitor.  MSSA bacteremia/septic shock 2 out of 2 blood cultures positive on 10/9.  Patient was on cefazolin.  Discussed again with ID and pharmacy.  Concern remains for recurrent bacteremia and possible endocarditis due to recurrence of fever.  So they have recommended cefazolin to be continued for 24 days from 10/31.  Repeat blood cultures have been negative.  Pseudomonas and Acinetobacter in BAL specimen Patient was started on meropenem on 10/23 for a 7-day treatment.  Treatment extended to complete a 10-day course.  Acute renal failure Nephrology was consulted and the patient was started on CRRT. CRRT was held on 10/27 due to alkalosis from citrate protocol.  There was also an issue of inability to use heparin due to GI bleed.  There was also some mention of potentially transitioning to intermittent hemodialysis.  However CRRT was restarted with low-dose heparin.  Nephrology has been following.  CRRT was discontinued on the morning of 11/1.  Nephrology is waiting to see if there is  any renal recovery.  However patient has not made much urine.  Nephrology to see the patient today and give recommendations regarding intermittent hemodialysis.  In which case he will need to be transferred to [facility]  His BUN and creatinine have significantly increased this morning.  Acute GI bleed secondary to gastric ulcer Patient seen by  gastroenterology.  Underwent EGD with clipping of vessel and large ulcer.  Anticoagulation was initially held and resumed on 10/20.  However there was an abrupt drop in hemoglobin subsequently.  There was a suspicion that he was having experiencing slow intermittent GI bleed.  Patient again transfused on 10/28.  Patient with brown stools.   Continue PPI.  Twice a day.  H. pylori antibody was noted to be elevated.  Patient will need 2 weeks treatment for H. pylori when he is ready for discharge.  Plus he will need follow-up with [facility] gastroenterology.  Acute blood loss anemia Likely due to GI bleed.  Patient has been transfused with 5 units of PRBC so far during this hospitalization.  Last transfused with 2 units on 10/28.  Hemoglobin did respond appropriately.  Hemoglobin is low but stable.  No overt bleeding noted.  Elevated D-dimer Noted to be significantly elevated on 10/19 greater than 20.  Unable to fully anticoagulate due to GI bleed.  No evidence for DVT on Dopplers on 10/8.  Thought to be related to suspected empyema in the right lung.  D-dimer improved to 8.83 on 10/23.  At this time do not anticipate any further work-up as there are many reasons why the patient could have elevated D-dimer.  Paroxysmal atrial fibrillation with RVR Patient without any prior history of atrial fibrillation.  Atrial fibrillation likely occurred in the setting of acute illness.  Currently sinus rhythm.  Unable to fully anticoagulate due to GI bleeding.  History of BPH Monitor for urinary retention  FEN Not on IV fluids.  Monitor electrolytes.  On tube feedings.  Pressure injuries Pressure Injury 08/16/19 Lip Medial;Lower Stage I -  Intact skin with non-blanchable redness of a localized area usually over a bony prominence. healing  (Active)  08/16/19 2000  Location: Lip  Location Orientation: Medial;Lower  Staging: Stage I -  Intact skin with non-blanchable redness of a localized area usually over a bony  prominence.  Wound Description (Comments): healing   Present on Admission: No     Pressure Injury 08/16/19 Lip Lower;Mid Stage I -  Intact skin with non-blanchable redness of a localized area usually over a bony prominence. healing  (Active)  08/16/19 2342  Location: Lip  Location Orientation: Lower;Mid  Staging: Stage I -  Intact skin with non-blanchable redness of a localized area usually over a bony prominence.  Wound Description (Comments): healing   Present on Admission:      Pressure Injury 08/24/19 Buttocks Right;Left;Mid Deep Tissue Injury - Purple or maroon localized area of discolored intact skin or blood-filled blister due to damage of underlying soft tissue from pressure and/or shear. (Active)  08/24/19 0800  Location: Buttocks  Location Orientation: Right;Left;Mid  Staging: Deep Tissue Injury - Purple or maroon localized area of discolored intact skin or blood-filled blister due to damage of underlying soft tissue from pressure and/or shear.  Wound Description (Comments):   Present on Admission: No    DVT Prophylaxis: Subcutaneous heparin Code Status: DNR Family Communication: Family being updated by PCCM. Disposition Plan: Await further input from nephrology.  If he needs intermittent hemodialysis he will need to be transferred  to [facility].  His repeat Covid test was negative.  Another negative test will be required before he can be considered for rehabilitation whenever he is medically stable.     Medications:  Scheduled: . chlorhexidine  15 mL Mouth/Throat BID  . Chlorhexidine Gluconate Cloth  6 each Topical Daily  . darbepoetin (ARANESP) injection - NON-DIALYSIS  100 mcg Subcutaneous Q Wed-1800  . feeding supplement (PRO-STAT SUGAR FREE 64)  60 mL Per Tube TID  . heparin injection (subcutaneous)  7,500 Units Subcutaneous Q8H  . insulin aspart  0-9 Units Subcutaneous Q4H  . mouth rinse  15 mL Mouth Rinse 10 times per day  . pantoprazole (PROTONIX) IV  40  mg Intravenous Q12H  . polyethylene glycol  17 g Per Tube QHS  . sennosides  5 mL Per Tube QHS  . sucralfate  1 g Oral Q6H   Continuous: . sodium chloride Stopped (08/21/19 1920)  . sodium chloride 5 mL/hr at 08/25/19 0800  . [START ON 08/26/2019]  ceFAZolin (ANCEF) IV    . dextrose Stopped (08/16/19 1623)  . feeding supplement (VITAL 1.5 CAL) 1,000 mL (08/25/19 0032)  . meropenem (MERREM) IV Stopped (08/24/19 1144)   LDJ:TTSVXB chloride, sodium chloride, acetaminophen (TYLENOL) oral liquid 160 mg/5 mL, dextrose, fentaNYL (SUBLIMAZE) injection, heparin, metoprolol tartrate, pneumococcal 23 valent vaccine   Objective:  Vital Signs  Vitals:   08/25/19 0700 08/25/19 0800 08/25/19 0820 08/25/19 0900  BP: (!) 87/52 103/66 103/66 90/60  Pulse: (!) 106 (!) 108 (!) 108 (!) 103  Resp: (!) 28 (!) 32 (!) 26   Temp:      TempSrc:      SpO2: 98% 99% 99% 96%  Weight:      Height:        Intake/Output Summary (Last 24 hours) at 08/25/2019 1010 Last data filed at 08/25/2019 1000 Gross per 24 hour  Intake 1955.79 ml  Output 400 ml  Net 1555.79 ml   Filed Weights   08/22/19 0500 08/23/19 0441 08/24/19 0500  Weight: 82.7 kg 77.5 kg 72.9 kg    General appearance:  Less alert today compared to yesterday.  Not following commands. Resp: Tracheostomy is noted.  He has been transition back to trach collar this morning.  Coarse breath sounds bilaterally.  Crackles at the bases.  No wheezing or rhonchi. Cardio: S1-S2 is normal regular.  No S3-S4.  No rubs murmurs or bruit GI: Abdomen is soft.  Nontender nondistended.  Bowel sounds are present normal.  No masses organomegaly Extremities: Minimal edema bilateral lower extremities Neurologic: Opens his eyes to voice command but beyond that he is not following any commands.  No obvious deficits noted.     Lab Results:  Data Reviewed: I have personally reviewed following labs and imaging studies  CBC: Recent Labs  Lab 08/21/19 0500   08/22/19 0415 08/23/19 0415 08/23/19 0453 08/24/19 0515 08/25/19 0517  WBC 10.4  --  8.5 7.8  --  9.2 10.2  NEUTROABS 8.3*  --  6.3 5.7  --  6.9 8.1*  HGB 9.0*   < > 8.3* 8.3* 8.2* 8.7* 8.6*  HCT 29.4*   < > 27.0* 27.1* 24.0* 28.6* 28.0*  MCV 95.1  --  97.5 96.4  --  95.3 96.2  PLT 181  --  175 179  --  194 246   < > = values in this interval not displayed.    Basic Metabolic Panel: Recent Labs  Lab 08/21/19 0500  08/22/19 0415  08/22/19 1750 08/23/19 0415 08/23/19 0453 08/23/19 1550 08/24/19 0515 08/25/19 0517  NA 138   < > 137 138 136 136 136 137 138  K 4.7   < > 5.0 5.9* 4.6 4.6 4.5 4.4 4.9  CL 102   < > 103 103 102  --  104 103 103  CO2 28   < > _0 --  _1 GLUCOSE 147*   < > 140* 107* 124*  --  103* 117* 123*  BUN 74*   < > 48* 56* 45*  --  41* 38* 100*  CREATININE 3.00*   < > 2.37* 2.92* 2.01*  --  2.02* 1.60* 4.02*  CALCIUM 8.6*   < > 8.4* 8.8* 8.5*  --  8.5* 8.7* 9.5  MG 2.4  --  2.5*  --  2.5*  --   --  2.6* 2.9*  PHOS 5.0*   < > 3.9 7.1* 3.5  --  3.3 2.9 5.7*   < > = values in this interval not displayed.    GFR: Estimated Creatinine Clearance: 18.6 mL/min (A) (by C-G formula based on SCr of 4.02 mg/dL (H)).  Liver Function Tests: Recent Labs  Lab 08/22/19 0415 08/22/19 1750 08/23/19 0415 08/23/19 1550 08/24/19 0515  ALBUMIN 2.1* 2.1* 2.3* 2.4* 2.5*     CBG: Recent Labs  Lab 08/24/19 1617 08/24/19 2047 08/25/19 0015 08/25/19 0431 08/25/19 0749  GLUCAP 138* 122* 113* 132* 112*      Recent Results (from the past 240 hour(s))  Expectorated sputum assessment w rflx to resp cult     Status: None   Collection Time: 08/16/19  9:25 AM   Specimen: Expectorated Sputum  Result Value Ref Range Status   Specimen Description EXPECTORATED SPUTUM  Final   Special Requests Normal  Final   Sputum evaluation   Final    THIS SPECIMEN IS ACCEPTABLE FOR SPUTUM CULTURE Performed at [facility], [place] [address]  [place], [place] [address]    Report Status 08/16/2019 FINAL  Final  Fungus Culture With Stain     Status: None (Preliminary result)   Collection Time: 08/16/19  9:25 AM  Result Value Ref Range Status   Fungus Stain Final report  Final    Comment: (NOTE) Performed At: [facility] [place], [place] [id number] Rush Farmer MD [id number]    Fungus (Mycology) Culture PENDING  Incomplete   Fungal Source SPUTUM  Final    Comment: Performed at [facility], [place] [address], [place], [place] [address]  Culture, respiratory     Status: None   Collection Time: 08/16/19  9:25 AM  Result Value Ref Range Status   Specimen Description   Final    EXPECTORATED SPUTUM Performed at [place] [address] [place], [place] [address]    Special Requests   Final    Normal Reflexed from [id number] Performed at [facility], [place] [address], [place], [place] [address]    Gram Stain   Final    ABUNDANT WBC PRESENT, PREDOMINANTLY PMN NO ORGANISMS SEEN Performed at [facility], [place] [address] [place], [place] [address]    Culture FEW PSEUDOMONAS AERUGINOSA  Final   Report Status 08/18/2019 FINAL  Final   Organism ID, Bacteria PSEUDOMONAS AERUGINOSA  Final      Susceptibility   Pseudomonas aeruginosa - MIC*    CEFTAZIDIME 4 SENSITIVE Sensitive     CIPROFLOXACIN <=  0.25 SENSITIVE Sensitive     GENTAMICIN <=1 SENSITIVE Sensitive     IMIPENEM 1 SENSITIVE Sensitive     PIP/TAZO 8 SENSITIVE Sensitive     CEFEPIME 4 SENSITIVE Sensitive     * FEW PSEUDOMONAS AERUGINOSA  Fungus Culture Result     Status: None   Collection Time: 08/16/19  9:25 AM  Result Value Ref Range Status   Result 1 Comment  Final    Comment: (NOTE) KOH/Calcofluor preparation:  no fungus observed. Performed At: [facility] [address] [place], [place] [id number] Rush Farmer MD UX:[id number]   Culture, blood (Routine X 2) w  Reflex to ID Panel     Status: None   Collection Time: 08/20/19 12:35 PM   Specimen: BLOOD  Result Value Ref Range Status   Specimen Description   Final    BLOOD LEFT HAND Performed at [place] [address], [place], [place] [address]    Special Requests   Final    BOTTLES DRAWN AEROBIC AND ANAEROBIC Blood Culture adequate volume Performed at [place] [address] [place], [place] [address]    Culture   Final    NO GROWTH 5 DAYS Performed at [facility], [place] [address], [place], [place] [address]    Report Status 08/25/2019 FINAL  Final  Culture, blood (Routine X 2) w Reflex to ID Panel     Status: None   Collection Time: 08/20/19 12:48 PM   Specimen: BLOOD  Result Value Ref Range Status   Specimen Description   Final    BLOOD LEFT ARM Performed at [facility] [address], [place], [place] [address]    Special Requests   Final    BOTTLES DRAWN AEROBIC ONLY Blood Culture adequate volume Performed at [place] [address], [place], [place] [address]    Culture   Final    NO GROWTH 5 DAYS Performed at [facility], [place] [address], [place], [place] [address]    Report Status 08/25/2019 FINAL  Final  Culture, respiratory     Status: None   Collection Time: 08/20/19 12:54 PM   Specimen: Tracheal Aspirate  Result Value Ref Range Status   Specimen Description   Final    TRACHEAL ASPIRATE Performed at [facility] [address], [place], [place] [address]    Special Requests   Final    NONE Performed at [facility], [place] [address] [place], [place] [address]    Gram Stain   Final    ABUNDANT WBC PRESENT,BOTH PMN AND MONONUCLEAR NO ORGANISMS SEEN Performed at [facility], [place] [address], [place],  [address]    Culture RARE PSEUDOMONAS AERUGINOSA  Final   Report Status 08/23/2019 FINAL  Final   Organism ID,  Bacteria PSEUDOMONAS AERUGINOSA  Final      Susceptibility   Pseudomonas aeruginosa - MIC*    CEFTAZIDIME 4 SENSITIVE Sensitive     CIPROFLOXACIN <=0.25 SENSITIVE Sensitive     GENTAMICIN <=1 SENSITIVE Sensitive     IMIPENEM 1 SENSITIVE Sensitive     PIP/TAZO 8 SENSITIVE Sensitive     CEFEPIME 4 SENSITIVE Sensitive     * RARE PSEUDOMONAS AERUGINOSA  SARS CORONAVIRUS 2 (TAT 6-24 HRS) Nasopharyngeal Nasopharyngeal Swab     Status: None   Collection Time: 08/24/19  1:55 PM   Specimen: Nasopharyngeal Swab  Result Value Ref Range Status  SARS Coronavirus 2 NEGATIVE NEGATIVE Final    Comment: (NOTE) SARS-CoV-2 target nucleic acids are NOT DETECTED. The SARS-CoV-2 RNA is generally detectable in upper and lower respiratory specimens during the acute phase of infection. Negative results do not preclude SARS-CoV-2 infection, do not rule out co-infections with other pathogens, and should not be used as the sole basis for treatment or other patient management decisions. Negative results must be combined with clinical observations, patient history, and epidemiological information. The expected result is Negative. Fact Sheet for Patients: SugarRoll.be Fact Sheet for Healthcare Providers: https://www.woods-mathews.com/ This test is not yet approved or cleared by the [place] FDA and  has been authorized for detection and/or diagnosis of SARS-CoV-2 by FDA under an Emergency Use Authorization (EUA). This EUA will remain  in effect (meaning this test can be used) for the duration of the COVID-19 declaration under Section 56 4(b)(1) of the Act, 21 U.S.C. section 360bbb-3(b)(1), unless the authorization is terminated or revoked sooner. Performed at [facility], [address], [place], [place] [address]       Radiology Studies: No results found.     LOS: 31 days   Durrel Mcnee [employer] on [email or web address]   08/25/2019, 10:10 AM

## 2019-08-26 ENCOUNTER — Inpatient Hospital Stay (HOSPITAL_COMMUNITY): Payer: Medicare HMO

## 2019-08-26 DIAGNOSIS — J9311 Primary spontaneous pneumothorax: Secondary | ICD-10-CM

## 2019-08-26 DIAGNOSIS — R579 Shock, unspecified: Secondary | ICD-10-CM | POA: Diagnosis not present

## 2019-08-26 DIAGNOSIS — I639 Cerebral infarction, unspecified: Secondary | ICD-10-CM | POA: Diagnosis not present

## 2019-08-26 DIAGNOSIS — Z9689 Presence of other specified functional implants: Secondary | ICD-10-CM | POA: Diagnosis not present

## 2019-08-26 DIAGNOSIS — J939 Pneumothorax, unspecified: Secondary | ICD-10-CM | POA: Diagnosis not present

## 2019-08-26 DIAGNOSIS — J9600 Acute respiratory failure, unspecified whether with hypoxia or hypercapnia: Secondary | ICD-10-CM | POA: Diagnosis not present

## 2019-08-26 DIAGNOSIS — I959 Hypotension, unspecified: Secondary | ICD-10-CM | POA: Diagnosis not present

## 2019-08-26 DIAGNOSIS — E8770 Fluid overload, unspecified: Secondary | ICD-10-CM | POA: Diagnosis not present

## 2019-08-26 DIAGNOSIS — K250 Acute gastric ulcer with hemorrhage: Secondary | ICD-10-CM | POA: Diagnosis not present

## 2019-08-26 DIAGNOSIS — J80 Acute respiratory distress syndrome: Secondary | ICD-10-CM | POA: Diagnosis not present

## 2019-08-26 DIAGNOSIS — J1289 Other viral pneumonia: Secondary | ICD-10-CM | POA: Diagnosis not present

## 2019-08-26 DIAGNOSIS — J9601 Acute respiratory failure with hypoxia: Secondary | ICD-10-CM

## 2019-08-26 DIAGNOSIS — E872 Acidosis: Secondary | ICD-10-CM | POA: Diagnosis not present

## 2019-08-26 DIAGNOSIS — Z8673 Personal history of transient ischemic attack (TIA), and cerebral infarction without residual deficits: Secondary | ICD-10-CM

## 2019-08-26 DIAGNOSIS — A4901 Methicillin susceptible Staphylococcus aureus infection, unspecified site: Secondary | ICD-10-CM | POA: Diagnosis not present

## 2019-08-26 DIAGNOSIS — N179 Acute kidney failure, unspecified: Secondary | ICD-10-CM | POA: Diagnosis not present

## 2019-08-26 DIAGNOSIS — Z930 Tracheostomy status: Secondary | ICD-10-CM | POA: Diagnosis not present

## 2019-08-26 DIAGNOSIS — U071 COVID-19: Secondary | ICD-10-CM | POA: Diagnosis not present

## 2019-08-26 DIAGNOSIS — D649 Anemia, unspecified: Secondary | ICD-10-CM | POA: Diagnosis not present

## 2019-08-26 DIAGNOSIS — N401 Enlarged prostate with lower urinary tract symptoms: Secondary | ICD-10-CM | POA: Diagnosis not present

## 2019-08-26 HISTORY — DX: Personal history of transient ischemic attack (TIA), and cerebral infarction without residual deficits: Z86.73

## 2019-08-26 LAB — RENAL FUNCTION PANEL
Albumin: 2.1 g/dL — ABNORMAL LOW (ref 3.5–5.0)
Anion gap: 16 — ABNORMAL HIGH (ref 5–15)
BUN: 145 mg/dL — ABNORMAL HIGH (ref 8–23)
CO2: 19 mmol/L — ABNORMAL LOW (ref 22–32)
Calcium: 9.6 mg/dL (ref 8.9–10.3)
Chloride: 105 mmol/L (ref 98–111)
Creatinine, Ser: 6.43 mg/dL — ABNORMAL HIGH (ref 0.61–1.24)
GFR calc Af Amer: 10 mL/min — ABNORMAL LOW (ref >60)
GFR calc non Af Amer: 8 mL/min — ABNORMAL LOW (ref >60)
Glucose, Bld: 139 mg/dL — ABNORMAL HIGH (ref 70–99)
Phosphorus: 9.5 mg/dL — ABNORMAL HIGH (ref 2.5–4.6)
Potassium: 6.2 mmol/L — ABNORMAL HIGH (ref 3.5–5.1)
Sodium: 140 mmol/L (ref 135–145)

## 2019-08-26 LAB — GLUCOSE, CAPILLARY
Glucose-Capillary: 119 mg/dL — ABNORMAL HIGH (ref 70–99)
Glucose-Capillary: 132 mg/dL — ABNORMAL HIGH (ref 70–99)
Glucose-Capillary: 143 mg/dL — ABNORMAL HIGH (ref 70–99)
Glucose-Capillary: 125 mg/dL — ABNORMAL HIGH (ref 70–99)
Glucose-Capillary: 114 mg/dL — ABNORMAL HIGH (ref 70–99)

## 2019-08-26 LAB — HEPATITIS B SURFACE ANTIBODY,QUALITATIVE: Hep B S Ab: NONREACTIVE

## 2019-08-26 LAB — CBC
HCT: 27.2 % — ABNORMAL LOW (ref 39.0–52.0)
Hemoglobin: 8.3 g/dL — ABNORMAL LOW (ref 13.0–17.0)
MCH: 29.4 pg (ref 26.0–34.0)
MCHC: 30.5 g/dL (ref 30.0–36.0)
MCV: 96.5 fL (ref 80.0–100.0)
Platelets: 295 10*3/uL (ref 150–400)
RBC: 2.82 MIL/uL — ABNORMAL LOW (ref 4.22–5.81)
RDW: 16.6 % — ABNORMAL HIGH (ref 11.5–15.5)
WBC: 9.8 10*3/uL (ref 4.0–10.5)
nRBC: 3.3 % — ABNORMAL HIGH (ref 0.0–0.2)

## 2019-08-26 LAB — HEPATITIS B SURFACE ANTIGEN: Hepatitis B Surface Ag: NONREACTIVE

## 2019-08-26 LAB — HEPATITIS B CORE ANTIBODY, TOTAL: Hep B Core Total Ab: NONREACTIVE

## 2019-08-26 MED ORDER — WHITE PETROLATUM EX OINT
TOPICAL_OINTMENT | CUTANEOUS | Status: AC
  Administered 2019-08-26: 0.2
  Filled 2019-08-26: qty 28.35

## 2019-08-26 MED ORDER — FAMOTIDINE 40 MG/5ML PO SUSR
10.0000 mg | Freq: Every day | ORAL | Status: DC
  Administered 2019-08-26: 8 mg
  Filled 2019-08-26: qty 2.5

## 2019-08-26 MED ORDER — PENTAFLUOROPROP-TETRAFLUOROETH EX AERO: 1.0000 "application " | INHALATION_SPRAY | CUTANEOUS | Status: DC | PRN

## 2019-08-26 MED ORDER — SODIUM CHLORIDE 0.9 % IV SOLN: 100.0000 mL | INTRAVENOUS | Status: DC | PRN

## 2019-08-26 MED ORDER — HEPARIN SODIUM (PORCINE) 1000 UNIT/ML DIALYSIS
20.0000 [IU]/kg | INTRAMUSCULAR | Status: DC | PRN
  Administered 2019-08-26: 08:00:00 1500 [IU] via INTRAVENOUS_CENTRAL
  Administered 2019-08-28: 13:00:00 via INTRAVENOUS_CENTRAL
  Filled 2019-08-26 (×2): qty 1.5

## 2019-08-26 MED ORDER — LIDOCAINE-PRILOCAINE 2.5-2.5 % EX CREA: 1.0000 "application " | TOPICAL_CREAM | CUTANEOUS | Status: DC | PRN

## 2019-08-26 MED ORDER — HEPARIN SODIUM (PORCINE) 1000 UNIT/ML IJ SOLN
INTRAMUSCULAR | Status: AC
  Administered 2019-08-26: 1500 [IU] via INTRAVENOUS_CENTRAL
  Filled 2019-08-26: qty 4

## 2019-08-26 MED ORDER — ALBUMIN HUMAN 25 % IV SOLN
25.0000 g | Freq: Once | INTRAVENOUS | Status: AC
  Administered 2019-08-26: 25 g via INTRAVENOUS
  Filled 2019-08-26: qty 100

## 2019-08-26 MED ORDER — ALTEPLASE 2 MG IJ SOLR: 2.0000 mg | Freq: Once | INTRAMUSCULAR | Status: DC | PRN

## 2019-08-26 MED ORDER — PHENYLEPHRINE HCL-NACL 10-0.9 MG/250ML-% IV SOLN
0.0000 ug/min | INTRAVENOUS | Status: DC
  Administered 2019-08-26: 25 ug/min via INTRAVENOUS
  Administered 2019-08-26: 150 ug/min via INTRAVENOUS
  Administered 2019-08-26: 13:00:00 20 ug/min via INTRAVENOUS
  Administered 2019-08-28: 10 ug/min via INTRAVENOUS
  Administered 2019-08-28: 15 ug/min via INTRAVENOUS
  Administered 2019-08-29: 34 ug/min via INTRAVENOUS
  Administered 2019-08-29: 30 ug/min via INTRAVENOUS
  Administered 2019-08-29: 12 ug/min via INTRAVENOUS
  Administered 2019-08-30: 34 ug/min via INTRAVENOUS
  Administered 2019-08-30: 25 ug/min via INTRAVENOUS
  Administered 2019-08-30: 55 ug/min via INTRAVENOUS
  Administered 2019-08-30: 46 ug/min via INTRAVENOUS
  Administered 2019-08-30: 50 ug/min via INTRAVENOUS
  Administered 2019-08-31: 40 ug/min via INTRAVENOUS
  Administered 2019-08-31: 44 ug/min via INTRAVENOUS
  Administered 2019-08-31: 40 ug/min via INTRAVENOUS
  Administered 2019-08-31: 34 ug/min via INTRAVENOUS
  Administered 2019-08-31: 30 ug/min via INTRAVENOUS
  Administered 2019-09-01: 70 ug/min via INTRAVENOUS
  Administered 2019-09-01: 22 ug/min via INTRAVENOUS
  Administered 2019-09-02: 5 ug/min via INTRAVENOUS
  Filled 2019-08-26: qty 500
  Filled 2019-08-26 (×7): qty 250
  Filled 2019-08-26: qty 500
  Filled 2019-08-26 (×8): qty 250
  Filled 2019-08-26: qty 500
  Filled 2019-08-26: qty 250

## 2019-08-26 MED ORDER — HEPARIN SODIUM (PORCINE) 1000 UNIT/ML DIALYSIS
1000.0000 [IU] | INTRAMUSCULAR | Status: DC | PRN
  Administered 2019-08-28 – 2019-08-30 (×2): 1000 [IU] via INTRAVENOUS_CENTRAL
  Filled 2019-08-26 (×2): qty 1

## 2019-08-26 MED ORDER — LIDOCAINE HCL (PF) 1 % IJ SOLN: 5.0000 mL | INTRAMUSCULAR | Status: DC | PRN

## 2019-08-26 NOTE — Evaluation (Signed)
Occupational Therapy Evaluation Patient Details Name: Anis Degidio MRN: [id number] DOB: [birth year] Today's Date: 08/26/2019    History of Present Illness 68 y/o male diagnosed with COVID on 9/29 presented to the [facility] ED on 10/2 with dyspnea, hypoxemia requiring intubation.  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  Tracheostomy placed on 10/30. Tested COVID neg x2 on 11/2.     Clinical Impression   PTA, pt was living with his wife and was independent and enjoyed playing golf. Pt currently requiring Total A for ADLs and bed mobility. Pt presenting with poor strength, balance, activity tolerance, and arousal. Pt tolerating sitting at EOB for ~ 5 minutes with total A for trunk control and elevation of head. VSS throughout session. Pt would benefit from further acute OT to facilitate safe dc. Recommend dc to SNF for further OT to optimize safety, independence with ADLs, and return to PLOF.      Follow Up Recommendations  SNF;Supervision/Assistance - 24 hour    Equipment Recommendations  Other (comment)(Defer to next venue)    Recommendations for Other Services PT consult     Precautions / Restrictions Precautions Precautions: None Restrictions Weight Bearing Restrictions: No      Mobility Bed Mobility Overal bed mobility: Needs Assistance Bed Mobility: Rolling;Sidelying to Sit;Sit to Sidelying Rolling: Total assist;+2 for safety/equipment;+2 for physical assistance Sidelying to sit: Total assist;+2 for physical assistance;+2 for safety/equipment     Sit to sidelying: +2 for physical assistance;Total assist;+2 for safety/equipment General bed mobility comments: Total A +2 for bed mobility to bring BLEs towards EOB and then elevate trunk into upright posture  Transfers                 General transfer comment: Defered for safety    Balance                                            ADL either performed or assessed with clinical judgement   ADL Overall ADL's : Needs assistance/impaired                                       General ADL Comments: Total A for ADLs and bed mobility     Vision Baseline Vision/History: Wears glasses Wears Glasses: At all times Patient Visual Report: Other (comment)(unsure; pt unable to state) Additional Comments: Pt not making eye contact or focusing on objects     Perception     Praxis      Pertinent Vitals/Pain Pain Assessment: Faces Faces Pain Scale: Hurts even more Pain Location: grimacing with bed mobility Pain Descriptors / Indicators: Discomfort;Grimacing Pain Intervention(s): Monitored during session;Repositioned     Hand Dominance     Extremity/Trunk Assessment Upper Extremity Assessment Upper Extremity Assessment: Generalized weakness(Pt wihtout active movement)       Cervical / Trunk Assessment Cervical / Trunk Assessment: Other exceptions Cervical / Trunk Exceptions: Forward flexion of head and shoulders   Communication Communication Communication: Other (comment)(ventilator)   Cognition Arousal/Alertness: Lethargic Behavior During Therapy: Flat affect Overall Cognitive Status: Difficult to assess  General Comments: Pt with eye opening but not focusing on objects. Not following commands. Grimacing with bed mobility.    General Comments  VSS throughout. Wife present throughout    Exercises     Shoulder Instructions      Home Living Family/patient expects to be discharged to:: Private residence Living Arrangements: Spouse/significant other Available Help at Discharge: Family Type of Home: House Home Access: Stairs to enter Technical brewer of Steps: 3 Entrance Stairs-Rails: Right;Left Home Layout: One level     Bathroom Shower/Tub: Teacher, early years/pre:  Handicapped height Bathroom Accessibility: Yes   Home Equipment: None          Prior Functioning/Environment Level of Independence: Independent        Comments: golfs everyday, retired 2 months         OT Problem List: Decreased strength;Decreased range of motion;Decreased activity tolerance;Impaired balance (sitting and/or standing);Impaired vision/perception;Decreased cognition;Decreased coordination;Decreased safety awareness;Decreased knowledge of use of DME or AE;Decreased knowledge of precautions;Impaired UE functional use;Pain      OT Treatment/Interventions: Self-care/ADL training;Therapeutic exercise;DME and/or AE instruction;Energy conservation;Therapeutic activities;Patient/family education    OT Goals(Current goals can be found in the care plan section) Acute Rehab OT Goals Patient Stated Goal: Wife wanting him to return to PLOF OT Goal Formulation: With family Time For Goal Achievement: 09/09/19 Potential to Achieve Goals: Good  OT Frequency: Min 2X/week   Barriers to D/C:            Co-evaluation              AM-PAC OT "6 Clicks" Daily Activity     Outcome Measure Help from another person eating meals?: Total Help from another person taking care of personal grooming?: Total Help from another person toileting, which includes using toliet, bedpan, or urinal?: Total Help from another person bathing (including washing, rinsing, drying)?: Total Help from another person to put on and taking off regular upper body clothing?: Total Help from another person to put on and taking off regular lower body clothing?: Total 6 Click Score: 6   End of Session Equipment Utilized During Treatment: Oxygen Nurse Communication: Mobility status  Activity Tolerance: Patient limited by fatigue;Patient limited by lethargy Patient left: in bed;with call bell/phone within reach;with family/visitor present  OT Visit Diagnosis: Unsteadiness on feet (R26.81);Other  abnormalities of gait and mobility (R26.89);Muscle weakness (generalized) (M62.81)                Time: 2244-9753 OT Time Calculation (min): 30 min Charges:  OT General Charges $OT Visit: 1 Visit OT Evaluation $OT Eval High Complexity: 1 High  Nedra Mcinnis MSOT, OTR/L Acute Rehab Pager: [phone] Office: [place] 08/26/2019, 4:36 PM

## 2019-08-26 NOTE — Progress Notes (Signed)
Changed ballard and setup.

## 2019-08-26 NOTE — Evaluation (Signed)
Physical Therapy Evaluation Patient Details Name: [name] MRN: [id number] DOB: 03/30/1951 Today's Date: 08/26/2019   History of Present Illness  68 y/o male diagnosed with COVID on 9/29 presented to the [facility] ED on 10/2 with dyspnea, hypoxemia requiring intubation.  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  Tracheostomy placed on 10/30. Tested COVID neg x2 on 11/2.   Clinical Impression  Pt's wife in room and provided information on house set up and PLOF. Pt lives in single story home with 3 steps to enter and was completely independent. 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 Pt is currently limited in mobility primarily by decreased arousal. Pt opens eyes to his name but has difficulty with focusing attention. With limited arousal pt unable to activate any muscle movement, and has decreased righting response in seated. Pt requires total A for moving to and from the [place]. Pt requires total A for maintaining balance, but was able to tolerate for ~5 minutes. During which time, PROM performed especially stretching of pectoralis muscles and supporting cervical spine in neutral. PT recommending SNF level rehab at discharge to progress pt back to PLOF. PT will continue to follow acutely.     Follow Up Recommendations SNF    Equipment Recommendations  Other (comment)(TBD at next venue)    Recommendations for Other Services       Precautions / Restrictions Precautions Precautions: None Restrictions Weight Bearing Restrictions: No      Mobility  Bed Mobility Overal bed mobility: Needs Assistance Bed Mobility: Rolling;Sidelying to Sit;Sit to Sidelying Rolling: Total assist;+2 for safety/equipment;+2 for physical assistance Sidelying to sit: Total assist;+2 for physical assistance;+2 for safety/equipment      Sit to sidelying: +2 for physical assistance;Total assist;+2 for safety/equipment General bed mobility comments: Total A +2 for bed mobility to bring BLEs towards EOB and then elevate trunk into upright posture  Transfers                 General transfer comment: Defered for safety        Balance Overall balance assessment: Needs assistance Sitting-balance support: Feet supported;Bilateral upper extremity supported;No upper extremity supported Sitting balance-Leahy Scale: Zero Sitting balance - Comments: requires assist for maintaining balance, no righting response at all in any direction                                     Pertinent Vitals/Pain Pain Assessment: Faces Faces Pain Scale: Hurts even more Pain Location: grimacing with bed mobility Pain Descriptors / Indicators: Discomfort;Grimacing Pain Intervention(s): Monitored during session;Limited activity within patient's tolerance;Repositioned    Home Living Family/patient expects to be discharged to:: Private residence Living Arrangements: Spouse/significant other Available Help at Discharge: Family Type of Home: House Home Access: Stairs to enter Entrance Stairs-Rails: Psychiatric nurse of Steps: 3 Home Layout: One level Home Equipment: None      Prior Function Level of Independence: Independent         Comments: golfs everyday, retired 2 months      Hand Dominance        Extremity/Trunk Assessment   Upper Extremity Assessment Upper Extremity Assessment: Defer to OT evaluation    Lower Extremity Assessment Lower Extremity Assessment: Generalized weakness(no active movement )  Cervical / Trunk Assessment Cervical / Trunk Assessment: Other exceptions Cervical / Trunk Exceptions: forward flexion of head and shoulders  Communication   Communication: Other (comment)(ventilator)  Cognition Arousal/Alertness: Lethargic Behavior During Therapy: Flat  affect Overall Cognitive Status: Difficult to assess                                 General Comments: Pt with eye opening but not focusing on objects. Not following commands. Grimacing with bed mobility.       General Comments General comments (skin integrity, edema, etc.): VSS throughout. Wife present throughout    Exercises     Assessment/Plan    PT Assessment Patient needs continued PT services  PT Problem List Decreased strength;Decreased range of motion;Decreased activity tolerance;Decreased balance;Decreased mobility;Decreased coordination;Decreased cognition;Decreased safety awareness;Decreased knowledge of precautions;Cardiopulmonary status limiting activity       PT Treatment Interventions DME instruction;Gait training;Functional mobility training;Therapeutic activities;Therapeutic exercise;Balance training;Cognitive remediation;Patient/family education    PT Goals (Current goals can be found in the Care Plan section)  Acute Rehab PT Goals Patient Stated Goal: Wife wanting him to return to PLOF PT Goal Formulation: With family Time For Goal Achievement: 09/09/19 Potential to Achieve Goals: Fair    Frequency Min 3X/week        Co-evaluation PT/OT/SLP Co-Evaluation/Treatment: Yes Reason for Co-Treatment: Complexity of the patient's impairments (multi-system involvement);For patient/therapist safety PT goals addressed during session: Mobility/safety with mobility         AM-PAC PT "6 Clicks" Mobility  Outcome Measure Help needed turning from your back to your side while in a flat bed without using bedrails?: Total Help needed moving from lying on your back to sitting on the side of a flat bed without using bedrails?: Total Help needed moving to and from a bed to a chair (including a wheelchair)?: Total Help needed standing up from a chair using your arms (e.g., wheelchair or bedside chair)?: Total Help needed to walk in hospital room?: Total Help  needed climbing 3-5 steps with a railing? : Total 6 Click Score: 6    End of Session Equipment Utilized During Treatment: Oxygen Activity Tolerance: Patient limited by lethargy Patient left: in bed;with call bell/phone within reach;with bed alarm set;with family/visitor present Nurse Communication: Mobility status PT Visit Diagnosis: Unsteadiness on feet (R26.81);Other abnormalities of gait and mobility (R26.89);Muscle weakness (generalized) (M62.81);Difficulty in walking, not elsewhere classified (R26.2)    Time: 4782-9562 PT Time Calculation (min) (ACUTE ONLY): 29 min   Charges:   PT Evaluation $PT Eval Moderate Complexity: 1 Mod          Vivion Romano B. Migdalia Dk PT, DPT Acute Rehabilitation Services Pager [phone] Office [phone]   [place] 08/26/2019, 4:52 PM

## 2019-08-26 NOTE — Progress Notes (Addendum)
NAME:  [name], MRN:  [id number], DOB:  22-Jul-1951, LOS: 65 ADMISSION DATE:  07/25/2019, CONSULTATION DATE:  10/3 REFERRING MD:  Nadara Mustard, CHIEF COMPLAINT:  Dyspnea   Brief History   68 y/o male diagnosed with COVID on 9/29 presented to the [facility] ED on 10/2 with dyspnea, hypoxemia requiring intubation.  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  Tracheostomy placed on 10/30.   Past Medical History  GERD  Significant Hospital Events   10/03 Admit to [facility] from [facility] ER, start decadron and remdesivir, given tociluzimab; prone positioning 10/04 convalescent plasma 10/05 stop prone positioning 10/06 convalescent plasma; prone positioning again 10/09 start ABx 10/10 MSSA bacteremia, CVL d/ced; ID consulted; increase OG tube outpt 10/11 vent weaning trial started 10/13 fever, pneumothorax, pig tail chest tube placed 10/14 worsening hypotension and renal fx >> consulted nephrology; worsening PTX >> replaced chest tube; GNR in sputum >> ABx changed 10/15 start CRRT; endobronchial blocker placed for persistent air leak 10/16 endobronchial blocker repositioned, changed chest tube to water seal; melana with ABLA >> GI consulted; transfuse PRBC 10/17 persistent air leak, increased WOB; start nimbex gtt >> air leak decreased; EGD; A fib with RVR >> start amiodarone 10/18 transfuse PRBC; GI s/o 10/20 resume heparin gtt 10/21 stopped nimbex, chest tube to suction, stopped heparin drip because Hgb dropped again 10/22 TPA in chest tube, repositioned endobronchial blocker 10/23 deflated endobronchial balloon 10/24 small air leak  10/25 removed bronchial blocker 10/30 tracheostomy  11/1 D/C CRRT 11/2 Transfer to [facility] 11/3 Started on IHD  Consults:  ID Nephrology Gastroenterology PCCM  Procedures:   10/2 R IJ CVL > 10/10 10/13 Rt pig tail chest tube >> 10/14 10/14 R  58F chest tube > 10/14 L IJ HD cath > 10/30 tracheostomy >   Significant Diagnostic Tests:  10/2 CT angiogram chest > extensive bilateral airspace disease predominantly posterior 10/8 doppler legs b/l > no DVT 10/15 renal u/s > normal 10/17 EGD > non bleeding gastric ulcer 10/19 Echo >> EF 60 to 65%, hyperdynamic LV with small LVOT gradient 10/22 CT chest > large collection of air in pleural space with fluid, pneumonia bilaterally R>L endobronchial blocker in place, chest tube anterior 10/31 CXR > persistent small right pneumothorax despite chest in adequate position Micro Data:  9/20 SARS COV 2 > POSITIVE 10/2 blood > negative 10/9 blood > MSSA 2/4 10/9 resp > MSSA, Pseudomonas 10/13 blood >> negative 10/21 bronch wash bacterial > pseduomonas, acinetobacter baumannii 10/21 bronch wash fungal >  10/21 bronch wash aspergillus antigen> negative 10/24 bronchoscopy wash> pseudomonas 10/28 Blood cx>>> 10/28 Sputum cx>>> Pseudomonas 11/1 SARS COV 2 > negative  Antimicrobials:  10/3 remdesivir > 10/6 10/3 actemra 10/3 decadron > 10/12 10/4 convalescent plasma 10/6 convalescent plasma  10/9 vancomycin > 10/10 10/9 cefepime > 10/10 10/10 ancef > 10/13 10/13 cefepime >10/20 10/14 anidulofungin > 10/15  10/20 ancef > 10/23 10/23 meropenem > >>>  10/21 anidulofungin> 10/22 10/22 voriconazole > 10/23 Merrem 10/28>>>10/31 Vanc 10/28>>>11/1 Ancef 10/31>> 11/3  Interim history/subjective:   Increased work of breathing and hypotension upon initiation of IHD. No improvement with albumin. PE started to support BP.  Objective   Blood pressure (!) 83/56, pulse (!) 107, temperature 98.5 F (36.9 C), temperature source Axillary, resp. rate (!) 27, height 5\' 10"  (1.778 m), weight 79.8 kg, SpO2 97 %.  Intake/Output Summary (Last 24 hours) at 08/26/2019 0857 Last data filed at 08/26/2019 0800 Gross per 24 hour  Intake 1694.74 ml  Output 750 ml  Net 944.74 ml   Filed  Weights   08/23/19 0441 08/24/19 0500 08/26/19 0700  Weight: 77.5 kg 72.9 kg 79.8 kg    Examination:  General:  In bed on vent HENT:  tracheostomy in place with no tracheal deviation.  PULM: Chest clear on left with no adventitial sounds. Right side breath sounds decreased at bases with rhonchi.  Intermittent air leak with tidal breathing. CV: HS are normal and extremities are warm. GI: BS+, soft, nontender MSK: normal bulk and tone Neuro: will open eyes to voice, doesn't move extremities. Ext: L IJ dialysis catheter in place.   Resolved Hospital Problem list     Assessment & Plan:   Critically ill due to hypotension requiring vasopressors since starting HD Likely due to fluid shifts.  -CXR to rule out expanding pneumothorax  Acute respiratory failure with hypoxemia, ventilator dependence: improved Monitor carefully on trach collar in ICU setting Attempt 24 hours again off vent today - keeping patient off ventilator will be key in helping air leak heal.  Trach care per routine Continue pulmonary toilette per protocol  Bronchopleural fistula Continue chest tube to suction Consider thoracic surgery intervention when mental status/neuromuscular strength improves  AKI: no evidence of recovery Move to [facility] for intermittent hemodialysis Will need Permacath placed  Neuromuscular weakness/encephalopathy PT consult, range of motion exercises Minimize sedation.   MSSA bacteremia Echocardiogram negative.  -Complete 14 days of cefazolin  Best practice:  Diet: tube feeding Pain/Anxiety/Delirium protocol (if indicated): fentanyl for pain. VAP protocol (if indicated): yes DVT prophylaxis: sub cutaneous heparin GI prophylaxis: Pantoprazole for stress ulcer prophylaxis Glucose control: SSI Mobility: PT  Code Status: DNR if arrests Family Communication: Aneta Mins for an update on 11/2, she voiced understanding Disposition: remain in ICU  Labs   CBC: Recent Labs   Lab 08/21/19 0500  08/22/19 0415 08/23/19 0415 08/23/19 0453 08/24/19 0515 08/25/19 0517 08/26/19 0724  WBC 10.4  --  8.5 7.8  --  9.2 10.2 PENDING  NEUTROABS 8.3*  --  6.3 5.7  --  6.9 8.1*  --   HGB 9.0*   < > 8.3* 8.3* 8.2* 8.7* 8.6* 8.3*  HCT 29.4*   < > 27.0* 27.1* 24.0* 28.6* 28.0* 27.2*  MCV 95.1  --  97.5 96.4  --  95.3 96.2 96.5  PLT 181  --  175 179  --  194 246 295   < > = values in this interval not displayed.    Basic Metabolic Panel: Recent Labs  Lab 08/21/19 0500  08/22/19 0415  08/23/19 0415 08/23/19 0453 08/23/19 1550 08/24/19 0515 08/25/19 0517 08/26/19 0724  NA 138   < > 137   < > 136 136 136 137 138 140  K 4.7   < > 5.0   < > 4.6 4.6 4.5 4.4 4.9 6.2*  CL 102   < > 103   < > 102  --  104 103 103 105  CO2 28   < > 28   < > 26  --  25 26 22  19*  GLUCOSE 147*   < > 140*   < > 124*  --  103* 117* 123* 139*  BUN 74*   < > 48*   < > 45*  --  41* 38* 100* 145*  CREATININE 3.00*   < >  2.37*   < > 2.01*  --  2.02* 1.60* 4.02* 6.43*  CALCIUM 8.6*   < > 8.4*   < > 8.5*  --  8.5* 8.7* 9.5 9.6  MG 2.4  --  2.5*  --  2.5*  --   --  2.6* 2.9*  --   PHOS 5.0*   < > 3.9   < > 3.5  --  3.3 2.9 5.7* 9.5*   < > = values in this interval not displayed.   GFR: Estimated Creatinine Clearance: 11.7 mL/min (A) (by C-G formula based on SCr of 6.43 mg/dL (H)). Recent Labs  Lab 08/21/19 0500 08/22/19 0415 08/23/19 0415 08/24/19 0515 08/25/19 0517 08/26/19 0724  PROCALCITON 5.15 4.38  --   --  5.82  --   WBC 10.4 8.5 7.8 9.2 10.2 PENDING    Liver Function Tests: Recent Labs  Lab 08/22/19 1750 08/23/19 0415 08/23/19 1550 08/24/19 0515 08/26/19 0724  ALBUMIN 2.1* 2.3* 2.4* 2.5* 2.1*   No results for input(s): LIPASE, AMYLASE in the last 168 hours. No results for input(s): AMMONIA in the last 168 hours.  ABG    Component Value Date/Time   PHART 7.401 08/23/2019 0453   PCO2ART 41.1 08/23/2019 0453   PO2ART 66.0 (L) 08/23/2019 0453   HCO3 25.7 08/23/2019  0453   TCO2 27 08/23/2019 0453   ACIDBASEDEF 2.0 08/14/2019 0515   O2SAT 93.0 08/23/2019 0453     Coagulation Profile: Recent Labs  Lab 08/22/19 1121  INR 1.0    Cardiac Enzymes: No results for input(s): CKTOTAL, CKMB, CKMBINDEX, TROPONINI in the last 168 hours.  HbA1C: Hgb A1c MFr Bld  Date/Time Value Ref Range Status  07/26/2019 05:00 AM 6.1 (H) 4.8 - 5.6 % Final    Comment:    (NOTE) Pre diabetes:          5.7%-6.4% Diabetes:              >6.4% Glycemic control for   <7.0% adults with diabetes     CBG: Recent Labs  Lab 08/25/19 1600 08/25/19 1952 08/25/19 2348 08/26/19 0330 08/26/19 0810  GLUCAP 132* 104* 132* 119* 132*     CRITICAL CARE Performed by: Kipp Brood   Total critical care time: 35 minutes  Critical care time was exclusive of separately billable procedures and treating other patients.  Critical care was necessary to treat or prevent imminent or life-threatening deterioration.  Critical care was time spent personally by me on the following activities: development of treatment plan with patient and/or surrogate as well as nursing, discussions with consultants, evaluation of patient's response to treatment, examination of patient, obtaining history from patient or surrogate, ordering and performing treatments and interventions, ordering and review of laboratory studies, ordering and review of radiographic studies, pulse oximetry, re-evaluation of patient's condition and participation in multidisciplinary rounds.  Kipp Brood, MD [facility] ICU Physician [place]  Pager: [phone] Mobile: [phone] After hours: [phone].

## 2019-08-26 NOTE — Progress Notes (Signed)
SLP Cancellation Note  Patient Details Name: [name] MRN: [id number] DOB: Feb 09, 1951   Cancelled treatment:       Reason Eval/Treat Not Completed: Patient's level of consciousness. Pt sleeping deeply, wife at beside. PMSV did not make the trip from [place]. Will f/u tomorrow with new PMSV. Wife will be here at 50.    Misti Towle, Katherene Ponto 08/26/2019, 3:30 PM

## 2019-08-26 NOTE — Progress Notes (Signed)
Pt. Bps 87-276 systolic. Dr. Marval Regal paged for bp parametrd during HD tx. Orders keep sbp >95 albumin 25g

## 2019-08-26 NOTE — Consult Note (Signed)
Reason for Consult:Air leak Referring Physician: CCM  [name] is an 68 y.o. male.  HPI: [name] is a 68 yo man previously healthy prior to contracting COVID- 19. Presented with shortness of breath and hypoxia on 07/13/19. Had mental status changes and was intubated for refractory hypoxia. Transferred to [facility].  While there developed a right pneumothorax. Pigtail catheter placed initially but ultimately required a large bore cest tube due to large air leak. While on vent had large leak that was compromising his ventilation and bronchial blocker placed to block RLL. Attempted to place tube to water seal but developed a loculated basilar pneumothorax. Had tracheostomy placed and now is on trach collar being periodically rested with vent. Still has a large air leak from chest tube.  He is unresponsive and is having an MR of the brain this evening to further evaluate  Has had 2 consecutive negative COVID tests  Past Medical History:  Diagnosis Date  . COVID-19   . Heartburn     Past Surgical History:  Procedure Laterality Date  . ACHILLES TENDON SURGERY Right 2012  . COLONOSCOPY  2009   [facility]  . ESOPHAGOGASTRODUODENOSCOPY N/A 12/25/2014   Procedure: ESOPHAGOGASTRODUODENOSCOPY (EGD);  Surgeon: Danie Binder, MD;  [facility];  Service: Endoscopy;  Laterality: N/A;  830am  . ESOPHAGOGASTRODUODENOSCOPY N/A 08/09/2019   Procedure: ESOPHAGOGASTRODUODENOSCOPY (EGD);  Surgeon: Ronald Lobo, MD;  Location: [facility];  Service: Endoscopy;  Laterality: N/A;  Patient is already sedated, so I do not anticipate need for further sedation.  Okay from my standpoint to do either at the bedside or in the operating room  . HEMOSTASIS CLIP PLACEMENT  08/09/2019   Procedure: HEMOSTASIS CLIP PLACEMENT;  Surgeon: Ronald Lobo, MD;  Location: [facility];  Service: Endoscopy;;  . HEMOSTASIS CONTROL  08/09/2019   Procedure: HEMOSTASIS CONTROL;  Surgeon: Ronald Lobo, MD;   Location: [facility];  Service: Endoscopy;;  epi   . KNEE SURGERY Left 1980's  . none    . WRIST SURGERY Left 1970's    Family History  Problem Relation Age of Onset  . Colon cancer Neg Hx   . Colon polyps Neg Hx     Social History:  reports that he has never smoked. He has never used smokeless tobacco. He reports current alcohol use of about 2.0 standard drinks of alcohol per week. He reports that he does not use drugs.  Allergies: No Known Allergies  Medications:  Scheduled: . chlorhexidine  15 mL Mouth/Throat BID  . Chlorhexidine Gluconate Cloth  6 each Topical Daily  . darbepoetin (ARANESP) injection - NON-DIALYSIS  100 mcg Subcutaneous Q Wed-1800  . famotidine  10.4 mg Per Tube Daily  . feeding supplement (PRO-STAT SUGAR FREE 64)  60 mL Per Tube TID  . Gerhardt's butt cream   Topical TID  . heparin injection (subcutaneous)  7,500 Units Subcutaneous Q8H  . insulin aspart  0-9 Units Subcutaneous Q4H  . mouth rinse  15 mL Mouth Rinse 10 times per day  . sucralfate  1 g Oral Q6H    Results for orders placed or performed during the hospital encounter of 07/25/19 (from the past 48 hour(s))  Glucose, capillary     Status: Abnormal   Collection Time: 08/24/19  8:47 PM  Result Value Ref Range   Glucose-Capillary 122 (H) 70 - 99 mg/dL  Glucose, capillary     Status: Abnormal   Collection Time: 08/25/19 12:15 AM  Result Value Ref Range  Glucose-Capillary 113 (H) 70 - 99 mg/dL  Glucose, capillary     Status: Abnormal   Collection Time: 08/25/19  4:31 AM  Result Value Ref Range   Glucose-Capillary 132 (H) 70 - 99 mg/dL  CBC with Differential/Platelet     Status: Abnormal   Collection Time: 08/25/19  5:17 AM  Result Value Ref Range   WBC 10.2 4.0 - 10.5 K/uL    Comment: WHITE COUNT CONFIRMED ON SMEAR   RBC 2.91 (L) 4.22 - 5.81 MIL/uL   Hemoglobin 8.6 (L) 13.0 - 17.0 g/dL   HCT 28.0 (L) 39.0 - 52.0 %   MCV 96.2 80.0 - 100.0 fL   MCH 29.6 26.0 - 34.0 pg   MCHC 30.7 30.0  - 36.0 g/dL   RDW 16.4 (H) 11.5 - 15.5 %   Platelets 246 150 - 400 K/uL   nRBC 4.4 (H) 0.0 - 0.2 %   Neutrophils Relative % 76 %   Neutro Abs 8.1 (H) 1.7 - 7.7 K/uL   Band Neutrophils 3 %   Lymphocytes Relative 6 %   Lymphs Abs 0.6 (L) 0.7 - 4.0 K/uL   Monocytes Relative 3 %   Monocytes Absolute 0.3 0.1 - 1.0 K/uL   Eosinophils Relative 2 %   Eosinophils Absolute 0.2 0.0 - 0.5 K/uL   Basophils Relative 0 %   Basophils Absolute 0.0 0.0 - 0.1 K/uL   WBC Morphology      MODERATE LEFT SHIFT (>5% METAS AND MYELOS,OCC PRO NOTED)   RBC Morphology NRBC'S PRESENT    Metamyelocytes Relative 3 %   Myelocytes 7 %   Abs Immature Granulocytes 1.00 (H) 0.00 - 0.07 K/uL   Polychromasia PRESENT     Comment: Performed at [facility], [place] [address] [place], [place] [address]  Basic metabolic panel     Status: Abnormal   Collection Time: 08/25/19  5:17 AM  Result Value Ref Range   Sodium 138 135 - 145 mmol/L   Potassium 4.9 3.5 - 5.1 mmol/L   Chloride 103 98 - 111 mmol/L   CO2 22 22 - 32 mmol/L   Glucose, Bld 123 (H) 70 - 99 mg/dL   BUN 100 (H) 8 - 23 mg/dL    Comment: REPEATED TO VERIFY   Creatinine, Ser 4.02 (H) 0.61 - 1.24 mg/dL    Comment: DELTA CHECK NOTED REPEATED TO VERIFY    Calcium 9.5 8.9 - 10.3 mg/dL   GFR calc non Af Amer 15 (L) >60 mL/min   GFR calc Af Amer 17 (L) >60 mL/min   Anion gap 13 5 - 15    Comment: Performed at [facility], [place] [address] [place], [place] [address]  Magnesium     Status: Abnormal   Collection Time: 08/25/19  5:17 AM  Result Value Ref Range   Magnesium 2.9 (H) 1.7 - 2.4 mg/dL    Comment: Performed at [facility], [place] [address], [place], [place] [address]  Phosphorus     Status: Abnormal   Collection Time: 08/25/19  5:17 AM  Result Value Ref Range   Phosphorus 5.7 (H) 2.5 - 4.6 mg/dL    Comment: Performed at [facility], [place] [address], [place], [place] [address]   Procalcitonin     Status: None   Collection Time: 08/25/19  5:17 AM  Result Value Ref Range   Procalcitonin 5.82 ng/mL    Comment:        Interpretation: PCT >  2 ng/mL: Systemic infection (sepsis) is likely, unless other causes are known. (NOTE)       Sepsis PCT Algorithm           Lower Respiratory Tract                                      Infection PCT Algorithm    ----------------------------     ----------------------------         PCT < 0.25 ng/mL                PCT < 0.10 ng/mL         Strongly encourage             Strongly discourage   discontinuation of antibiotics    initiation of antibiotics    ----------------------------     -----------------------------       PCT 0.25 - 0.50 ng/mL            PCT 0.10 - 0.25 ng/mL               OR       >80% decrease in PCT            Discourage initiation of                                            antibiotics      Encourage discontinuation           of antibiotics    ----------------------------     -----------------------------         PCT >= 0.50 ng/mL              PCT 0.26 - 0.50 ng/mL               AND       <80% decrease in PCT              Encourage initiation of                                             antibiotics       Encourage continuation           of antibiotics    ----------------------------     -----------------------------        PCT >= 0.50 ng/mL                  PCT > 0.50 ng/mL               AND         increase in PCT                  Strongly encourage                                      initiation of antibiotics    Strongly encourage escalation           of antibiotics                                     -----------------------------  PCT <= 0.25 ng/mL                                                 OR                                        > 80% decrease in PCT                                     Discontinue / Do not initiate                                              antibiotics Performed at [place] [address] [place], [place] [address]   Glucose, capillary     Status: Abnormal   Collection Time: 08/25/19  7:49 AM  Result Value Ref Range   Glucose-Capillary 112 (H) 70 - 99 mg/dL  Glucose, capillary     Status: Abnormal   Collection Time: 08/25/19 11:29 AM  Result Value Ref Range   Glucose-Capillary 130 (H) 70 - 99 mg/dL  SARS CORONAVIRUS 2 (TAT 6-24 HRS) Nasopharyngeal Nasopharyngeal Swab     Status: None   Collection Time: 08/25/19 11:37 AM   Specimen: Nasopharyngeal Swab  Result Value Ref Range   SARS Coronavirus 2 NEGATIVE NEGATIVE    Comment: (NOTE) SARS-CoV-2 target nucleic acids are NOT DETECTED. The SARS-CoV-2 RNA is generally detectable in upper and lower respiratory specimens during the acute phase of infection. Negative results do not preclude SARS-CoV-2 infection, do not rule out co-infections with other pathogens, and should not be used as the sole basis for treatment or other patient management decisions. Negative results must be combined with clinical observations, patient history, and epidemiological information. The expected result is Negative. Fact Sheet for Patients: SugarRoll.be Fact Sheet for Healthcare Providers: https://www.woods-mathews.com/ This test is not yet approved or cleared by the [place] FDA and  has been authorized for detection and/or diagnosis of SARS-CoV-2 by FDA under an Emergency Use Authorization (EUA). This EUA will remain  in effect (meaning this test can be used) for the duration of the COVID-19 declaration under Section 56 4(b)(1) of the Act, 21 U.S.C. section 360bbb-3(b)(1), unless the authorization is terminated or revoked sooner. Performed at [facility], [place] [address] [place], [place] [address]   Glucose, capillary     Status: Abnormal   Collection Time: 08/25/19  1:03 PM  Result Value Ref  Range   Glucose-Capillary 117 (H) 70 - 99 mg/dL  Glucose, capillary     Status: Abnormal   Collection Time: 08/25/19  4:00 PM  Result Value Ref Range   Glucose-Capillary 132 (H) 70 - 99 mg/dL  Glucose, capillary     Status: Abnormal   Collection Time: 08/25/19  7:52 PM  Result Value Ref Range   Glucose-Capillary 104 (H) 70 - 99 mg/dL  Glucose, capillary     Status: Abnormal   Collection Time: 08/25/19 11:48 PM  Result Value Ref Range   Glucose-Capillary 132 (H) 70 - 99 mg/dL  Glucose,  capillary     Status: Abnormal   Collection Time: 08/26/19  3:30 AM  Result Value Ref Range   Glucose-Capillary 119 (H) 70 - 99 mg/dL  Renal function panel     Status: Abnormal   Collection Time: 08/26/19  7:24 AM  Result Value Ref Range   Sodium 140 135 - 145 mmol/L   Potassium 6.2 (H) 3.5 - 5.1 mmol/L   Chloride 105 98 - 111 mmol/L   CO2 19 (L) 22 - 32 mmol/L   Glucose, Bld 139 (H) 70 - 99 mg/dL   BUN 145 (H) 8 - 23 mg/dL   Creatinine, Ser 6.43 (H) 0.61 - 1.24 mg/dL   Calcium 9.6 8.9 - 10.3 mg/dL   Phosphorus 9.5 (H) 2.5 - 4.6 mg/dL   Albumin 2.1 (L) 3.5 - 5.0 g/dL   GFR calc non Af Amer 8 (L) >60 mL/min   GFR calc Af Amer 10 (L) >60 mL/min   Anion gap 16 (H) 5 - 15    Comment: Performed at [facility], [address], [place], [place] [address]  CBC     Status: Abnormal   Collection Time: 08/26/19  7:24 AM  Result Value Ref Range   WBC 9.8 4.0 - 10.5 K/uL    Comment: WHITE COUNT CONFIRMED ON SMEAR   RBC 2.82 (L) 4.22 - 5.81 MIL/uL   Hemoglobin 8.3 (L) 13.0 - 17.0 g/dL   HCT 27.2 (L) 39.0 - 52.0 %   MCV 96.5 80.0 - 100.0 fL   MCH 29.4 26.0 - 34.0 pg   MCHC 30.5 30.0 - 36.0 g/dL   RDW 16.6 (H) 11.5 - 15.5 %   Platelets 295 150 - 400 K/uL   nRBC 3.3 (H) 0.0 - 0.2 %    Comment: Performed at [facility], [place] [address] [place], [place] [address]  Hepatitis B surface antibody,qualitative     Status: None   Collection Time: 08/26/19  7:24 AM  Result Value Ref Range   Hep B  S Ab NON REACTIVE NON REACTIVE    Comment: (NOTE) Inconsistent with immunity, less than 10 mIU/mL. Performed at [facility], [place] [address], [place], [place] [address]   Hepatitis B core antibody, total     Status: None   Collection Time: 08/26/19  7:24 AM  Result Value Ref Range   Hep B Core Total Ab NON REACTIVE NON REACTIVE    Comment: Performed at [place] [address] [place] [address]  Hepatitis B surface antigen     Status: None   Collection Time: 08/26/19  7:26 AM  Result Value Ref Range   Hepatitis B Surface Ag NON REACTIVE NON REACTIVE    Comment: Performed at [place] [address] [place], [place] [address]  Glucose, capillary     Status: Abnormal   Collection Time: 08/26/19  8:10 AM  Result Value Ref Range   Glucose-Capillary 132 (H) 70 - 99 mg/dL  Glucose, capillary     Status: Abnormal   Collection Time: 08/26/19 11:54 AM  Result Value Ref Range   Glucose-Capillary 143 (H) 70 - 99 mg/dL  Glucose, capillary     Status: Abnormal   Collection Time: 08/26/19  3:54 PM  Result Value Ref Range   Glucose-Capillary 125 (H) 70 - 99 mg/dL    Dg Chest Port 1 View  Result Date: 08/26/2019 CLINICAL DATA:  Pneumothorax. EXAM: PORTABLE CHEST 1 VIEW COMPARISON:  08/23/2019. FINDINGS: Tracheostomy tube, left IJ line, feeding tube, right chest tube in stable position. Stable right base pneumothorax. Stable atelectatic changes/infiltrate right upper lung and left base. Heart size stable. IMPRESSION: 1. Lines and tubes including right chest tube in stable position. Stable right base pneumothorax. 2. Stable right upper lung and left lower atelectatic changes/infiltrates. Chest is unchanged from prior exam. Electronically Signed   By: Marcello Moores  Register   On: 08/26/2019 10:36    Review of Systems  Unable to perform ROS: Mental status change   Blood pressure 107/68, pulse 87, temperature 97.9 F (36.6 C), temperature source Axillary, resp. rate  19, height 5\' 10"  (1.778 m), weight 79.8 kg, SpO2 100 %. Physical Exam  Constitutional:  intubated  Neck: No tracheal deviation present.  Cardiovascular: Normal rate and regular rhythm.  Respiratory: No respiratory distress.  Rhonchi R>L. Large Air leak from CT  GI: He exhibits no distension.  Neurological:  Unresponsive, no withdrawal to pain  Skin: Skin is warm and dry.    Assessment/Plan: [name] is a 68 yo previosuly healthy man with severe COVID-19 infection resulting in hypoxic respiratory failure. Developed a pneumothorax while intubated. Now has a chest tube in place with a persistent air leak. He has been able to wean from vent to trach collar. He is currently unresponsive and MR is being done to further evaluate.  Options are limited at present. I do not think he is a candidate for a VATS. Bronch for placement of IBV may help with air leak, but would be a high risk for recurrent bacterial pneumonia. Use of IBV would be off label as this not an FDA approved indication.  Await findings regarding unresponsiveness and will d/w family how aggressive they want to be  Melrose Nakayama 08/26/2019, 7:25 PM

## 2019-08-26 NOTE — Procedures (Signed)
I was present at this dialysis session. I have reviewed the session itself and made appropriate changes.   Vital signs in last 24 hours:  Temp:  [98 F (36.7 C)-98.8 F (37.1 C)] 98.5 F (36.9 C) (11/03 0800) Pulse Rate:  [92-112] 107 (11/03 0845) Resp:  [18-43] 27 (11/03 0845) BP: (65-140)/(42-85) 83/56 (11/03 0845) SpO2:  [91 %-100 %] 97 % (11/03 0845) Weight:  [79.8 kg] 79.8 kg (11/03 0700) Weight change:  Filed Weights   08/23/19 0441 08/24/19 0500 08/26/19 0700  Weight: 77.5 kg 72.9 kg 79.8 kg    Recent Labs  Lab 08/26/19 0724  NA 140  K 6.2*  CL 105  CO2 19*  GLUCOSE 139*  BUN 145*  CREATININE 6.43*  CALCIUM 9.6  PHOS 9.5*    Recent Labs  Lab 08/23/19 0415  08/24/19 0515 08/25/19 0517 08/26/19 0724  WBC 7.8  --  9.2 10.2 PENDING  NEUTROABS 5.7  --  6.9 8.1*  --   HGB 8.3*   < > 8.7* 8.6* 8.3*  HCT 27.1*   < > 28.6* 28.0* 27.2*  MCV 96.4  --  95.3 96.2 96.5  PLT 179  --  194 246 295   < > = values in this interval not displayed.    Scheduled Meds: . chlorhexidine  15 mL Mouth/Throat BID  . Chlorhexidine Gluconate Cloth  6 each Topical Daily  . Chlorhexidine Gluconate Cloth  6 each Topical Q0600  . darbepoetin (ARANESP) injection - NON-DIALYSIS  100 mcg Subcutaneous Q Wed-1800  . feeding supplement (PRO-STAT SUGAR FREE 64)  60 mL Per Tube TID  . Gerhardt's butt cream   Topical TID  . heparin injection (subcutaneous)  7,500 Units Subcutaneous Q8H  . insulin aspart  0-9 Units Subcutaneous Q4H  . mouth rinse  15 mL Mouth Rinse 10 times per day  . pantoprazole (PROTONIX) IV  40 mg Intravenous Q12H  . sucralfate  1 g Oral Q6H   Continuous Infusions: . sodium chloride Stopped (08/21/19 1920)  . sodium chloride 10 mL/hr at 08/25/19 2118  . sodium chloride    . sodium chloride    .  ceFAZolin (ANCEF) IV    . dextrose Stopped (08/16/19 1623)  . feeding supplement (VITAL 1.5 CAL) 60 mL/hr at 08/26/19 0800   PRN Meds:.sodium chloride, sodium chloride,  sodium chloride, sodium chloride, acetaminophen (TYLENOL) oral liquid 160 mg/5 mL, alteplase, dextrose, fentaNYL (SUBLIMAZE) injection, heparin, heparin, heparin, lidocaine (PF), lidocaine-prilocaine, metoprolol tartrate, pentafluoroprop-tetrafluoroeth, pneumococcal 23 valent vaccine, polyethylene glycol    Assessment/Plan:  1. AKI/CKD stage 3- in setting of covid PNA and has remained oliguric/anuric.  Started on CVVHD 08/07/19-08/24/19.  He is off pressors but remains anuric with rising BUN/Cr.   1. He was transferred to [place] [facility] for IHD 08/25/19 2. Seen on hd but low SBP 3. Will start neo prn to sustain BP and hopefully avoid resuming CVVHD 2. COVID PNA- treated with remdesivir, dexamethasone, and convalescent plasma.  Most recent covid test was negative. 3. VDRF - s/p trach and on vent per PCCM 4. Sepsis/shock- off of pressors 5. Right pneumothorax/emphyema/necrotic RLL- s/p chest tube with air leak. Per PCCM 6. A fib/RVR- per PCCM 7. GIB- acute s/p EGD which showed gastric ulcer with stigmata of recent bleeding s/p injection and clipping.  Able to tolerate low dose heparin with CVVHD before stopping it. 8. MSSA bacteremia- treated 9. Acinetobacter and pseudomonal pna- per PCCM, meropenem for 7 days. 10. Disposition- per [place],  MD 08/26/2019, 8:51 AM

## 2019-08-26 NOTE — Progress Notes (Signed)
Pt. Received albumin as documented. Current bp 76/58 MAP 65. Critical potassium 6.2. Dr. Marval Regal paged and made aware. Orders to keep pt UF goal even for remainder of treatment and switch acid bath to  1k for 30 minutes. Pt. Stable. Primary nurse Ronalee Belts aware

## 2019-08-26 NOTE — Progress Notes (Signed)
PROGRESS NOTE  [name] MNO:[id number] DOB: 30-Aug-1951 DOA: 07/25/2019  PCP: Kathyrn Drown, MD  Brief History/Interval Summary:  68 year old with a history of GERD and BPH who presented to [facility], ED with S OB and was found to be hypoxic.  He was initially diagnosed with COVID-19 September 20.  1 to 2 days prior to his presentation he developed worsening shortness of breath with anorexia.  His wife's physician during a teleconference call noted that the patient himself is very tachypneic and ultimately EMS was sent to the house.  EMS found the patient to have saturations in the 40s.  In the ED on nonrebreather the patient sats only improved to the 80s.  The patient was severely confused and required emergent intubation.  Chest x-ray confirmed multifocal infiltrates.  CT chest was negative for PE  Significant Events: 10/3 admit to [facility] from [facility] [place] ED 10/13 right pneumothorax -pigtail chest tube placed 10/14 Nephrology consulted 10/15 begin CRRT -endobronchial blocker placed 10/16 melena 10/17 A. fib with RVR -EGD 10/18 transfuse PRBC 10/20 Heparin drip resumed 10/21 abrupt drop in hemoglobin -transfuse 1 unit PRBC 10/23 tPA per chest tube  10/25 endobronchial blocker removed  10/26 1U PRBC 10/28: 2 units of PRBC transfused  Consultants: Pulmonology.  Nephrology.  Gastroenterology.  Procedures:  EGD 10/17:   Impression:               - No active bleeding or blood or coffee ground                            material in the upper tract at the time of this                            exam.                           - Normal esophagus.                           - Excessive gastric fluid (residual tube feeding)                            without anatomic gastric outlet obstruction. Fluid                            aspiration performed. The stomach was somewhat                            dilated and patulous, causing looping of the                            endoscope  along the greater curve during                            advancement, making it difficult to traverse the                            pylorus, although this was eventually successfully  accomplished.                           - Non-bleeding gastric ulcer with pigmented                            material suggesting stigma of recent hemorrhage.                            Injected with good blanching. Clips were placed,                            effectively sealing off the ulcer.                           - Normal examined duodenum.  Tracheostomy on 10/30  Antibiotics: Anti-infectives (From admission, onward)   Start     Dose/Rate Route Frequency Ordered Stop   08/26/19 2200  ceFAZolin (ANCEF) IVPB 1 g/50 mL premix     1 g 100 mL/hr over 30 Minutes Intravenous Every 24 hours 08/24/19 1021 09/15/19 2359   08/24/19 1100  meropenem (MERREM) 1 g in sodium chloride 0.9 % 100 mL IVPB     1 g 200 mL/hr over 30 Minutes Intravenous Every 24 hours 08/24/19 1021 08/25/19 1156   08/23/19 1100  ceFAZolin (ANCEF) IVPB 2g/100 mL premix  Status:  Discontinued     2 g 200 mL/hr over 30 Minutes Intravenous Every 12 hours 08/23/19 1048 08/24/19 1021   08/21/19 1700  vancomycin (VANCOCIN) IVPB 750 mg/150 ml premix  Status:  Discontinued     750 mg 150 mL/hr over 60 Minutes Intravenous Every 24 hours 08/21/19 0843 08/23/19 1048   08/21/19 1400  vancomycin (VANCOCIN) IVPB 750 mg/150 ml premix  Status:  Discontinued     750 mg 150 mL/hr over 60 Minutes Intravenous Every 24 hours 08/20/19 1146 08/20/19 1218   08/21/19 1000  meropenem (MERREM) 1 g in sodium chloride 0.9 % 100 mL IVPB  Status:  Discontinued     1 g 200 mL/hr over 30 Minutes Intravenous Every 12 hours 08/20/19 1535 08/23/19 1048   08/21/19 0413  meropenem (MERREM) 500 mg in sodium chloride 0.9 % 100 mL IVPB  Status:  Discontinued     500 mg 200 mL/hr over 30 Minutes Intravenous Every 24 hours 08/20/19 1245 08/20/19  1535   08/20/19 1330  vancomycin (VANCOCIN) 1,750 mg in sodium chloride 0.9 % 500 mL IVPB     1,750 mg 250 mL/hr over 120 Minutes Intravenous  Once 08/20/19 1222 08/20/19 1619   08/20/19 1300  vancomycin (VANCOCIN) 1,250 mg in sodium chloride 0.9 % 250 mL IVPB  Status:  Discontinued     1,250 mg 166.7 mL/hr over 90 Minutes Intravenous  Once 08/20/19 1146 08/20/19 1218   08/20/19 1224  vancomycin variable dose per unstable renal function (pharmacist dosing)  Status:  Discontinued      Does not apply See admin instructions 08/20/19 1225 08/21/19 1152   08/15/19 1600  meropenem (MERREM) 1 g in sodium chloride 0.9 % 100 mL IVPB  Status:  Discontinued     1 g 200 mL/hr over 30 Minutes Intravenous Every 12 hours 08/15/19 1543 08/20/19 1245   08/15/19 1000  voriconazole (VFEND) tablet 200 mg  Status:  Discontinued  200 mg Per Tube Every 12 hours 08/14/19 1148 08/15/19 1134   08/14/19 1600  anidulafungin (ERAXIS) 100 mg in sodium chloride 0.9 % 100 mL IVPB  Status:  Discontinued     100 mg 78 mL/hr over 100 Minutes Intravenous Every 24 hours 08/13/19 1645 08/14/19 1148   08/14/19 1300  voriconazole (VFEND) tablet 400 mg     400 mg Per Tube Every 12 hours 08/14/19 1148 08/14/19 2217   08/13/19 1700  anidulafungin (ERAXIS) 200 mg in sodium chloride 0.9 % 200 mL IVPB     200 mg 78 mL/hr over 200 Minutes Intravenous  Once 08/13/19 1645 08/13/19 2030   08/13/19 0200  ceFAZolin (ANCEF) IVPB 2g/100 mL premix  Status:  Discontinued     2 g 200 mL/hr over 30 Minutes Intravenous Every 12 hours 08/11/19 1512 08/15/19 1543   08/12/19 0200  ceFEPIme (MAXIPIME) 2 g in sodium chloride 0.9 % 100 mL IVPB     2 g 200 mL/hr over 30 Minutes Intravenous Every 12 hours 08/11/19 1509 08/12/19 1341   08/07/19 2200  ceFEPIme (MAXIPIME) 2 g in sodium chloride 0.9 % 100 mL IVPB  Status:  Discontinued     2 g 200 mL/hr over 30 Minutes Intravenous Every 12 hours 08/07/19 1540 08/11/19 1509   08/07/19 1000   anidulafungin (ERAXIS) 100 mg in sodium chloride 0.9 % 100 mL IVPB  Status:  Discontinued     100 mg 78 mL/hr over 100 Minutes Intravenous Every 24 hours 08/06/19 0934 08/08/19 1203   08/07/19 0800  ceFEPIme (MAXIPIME) 2 g in sodium chloride 0.9 % 100 mL IVPB  Status:  Discontinued     2 g 200 mL/hr over 30 Minutes Intravenous Every 24 hours 08/06/19 1036 08/07/19 1540   08/06/19 1000  anidulafungin (ERAXIS) 200 mg in sodium chloride 0.9 % 200 mL IVPB    Note to Pharmacy: please   200 mg 78 mL/hr over 200 Minutes Intravenous Every 24 hours 08/06/19 0925 08/06/19 1600   08/06/19 0800  ceFEPIme (MAXIPIME) 2 g in sodium chloride 0.9 % 100 mL IVPB  Status:  Discontinued     2 g 200 mL/hr over 30 Minutes Intravenous Every 12 hours 08/06/19 0733 08/06/19 1036   08/02/19 2200  ceFAZolin (ANCEF) IVPB 2g/100 mL premix  Status:  Discontinued     2 g 200 mL/hr over 30 Minutes Intravenous Every 8 hours 08/02/19 1338 08/06/19 0733   08/02/19 1400  vancomycin (VANCOCIN) 1,250 mg in sodium chloride 0.9 % 250 mL IVPB  Status:  Discontinued     1,250 mg 166.7 mL/hr over 90 Minutes Intravenous Every 24 hours 08/01/19 1320 08/02/19 1332   08/01/19 1330  vancomycin (VANCOCIN) 2,000 mg in sodium chloride 0.9 % 500 mL IVPB     2,000 mg 250 mL/hr over 120 Minutes Intravenous  Once 08/01/19 1158 08/01/19 1636   08/01/19 1300  ceFEPIme (MAXIPIME) 2 g in sodium chloride 0.9 % 100 mL IVPB  Status:  Discontinued     2 g 200 mL/hr over 30 Minutes Intravenous Every 12 hours 08/01/19 1158 08/02/19 1332   07/26/19 1600  remdesivir 100 mg in sodium chloride 0.9 % 250 mL IVPB     100 mg 500 mL/hr over 30 Minutes Intravenous Every 24 hours 07/26/19 0132 07/29/19 1823   07/26/19 0215  remdesivir 200 mg in sodium chloride 0.9 % 250 mL IVPB     200 mg 500 mL/hr over 30 Minutes Intravenous Once 07/26/19 0132 07/26/19 0520  Subjective/Interval History: Today, patient seen and examined at bedside.  Opens eyes when  name called, otherwise nonverbal, does not follow commands    Assessment/Plan:  Acute Hypoxic Resp. Failure/Pneumonia due to COVID-19 2 negative COVID-19 test, last on 08/25/2019 Remains afebrile, with no leukocytosis Patient underwent tracheostomy on 10/30, somewhat still vent dependent Intermittent trach collar Completed remdesivir and steroids Patient also received Actemra and convalescent plasma Pulmonary toilet/trach care per protocol PT/OT PCCM on board, plan to keep patient off vent for 24 hours  Lab Results  Component Value Date   [place] 08/25/2019   [place] NEGATIVE 08/24/2019   [place] Detected (A) 07/22/2019   COVID-19 specific Treatment: Decadron 10/2 > 10/12 Remdesivir 10/2 > 10/6 Actemra 10/2 Convalescent plasma 10/3  Encephalopathy/generalized weakness/deconditioned ?  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/empyema CT scan of the chest on 10/21 showed findings consistent with an empyema as well as persistent pneumothorax   TPA infused via chest tube on 10/23 Endobronchial blocker removed on 10/25 Chest tube management per pulmonology  MSSA bacteremia/septic shock Currently afebrile, with no leukocytosis 2 out of 2 blood cultures positive on 10/9, repeat BC have been negative, last on 08/20/19 NGTD Echo done on 08/11/2019 was negative Concern remains for recurrent bacteremia and possible endocarditis, ID recommended Cefazolin to be continued for 24 days from 10/31  Pseudomonas and Acinetobacter in BAL specimen Patient was started on meropenem on 10/23 for 10-day treatment Completed on 08/25/19  AKI on CKD stage 3 Remains anuric, HD dependent Status post CRRT, last done on 11/1 Initiated HD on 08/26/2019 Nephrology  on board  Acute GI bleed secondary to gastric ulcer S/p EGD with clipping of vessel and large ulcer H. pylori antibody was noted to be elevated [facility] on board Continue PPI Patient will need 2 weeks treatment for H. pylori when he is ready for discharge Follow-up with [facility] gastroenterology when discharged  Acute blood loss anemia Likely secondary to above Hemoglobin stable at new baseline No signs of overt bleeding Patient has been transfused with 5 units of PRBC so far during this hospitalization Last transfused with 2 units on 10/28  Elevated D-dimer No evidence for DVT on Dopplers on 10/8 Thought to be related to suspected empyema in the right lung  New onset paroxysmal atrial fibrillation with RVR Heart rate controlled, currently sinus rhythm Atrial fibrillation likely occurred in the setting of acute illness Unable to fully anticoagulate due to GI bleeding  Pressure injuries Pressure Injury 08/16/19 Lip Medial;Lower Stage I -  Intact skin with non-blanchable redness of a localized area usually over a bony prominence. healing  (Active)  08/16/19 2000  Location: Lip  Location Orientation: Medial;Lower  Staging: Stage I -  Intact skin with non-blanchable redness of a localized area usually over a bony prominence.  Wound Description (Comments): healing   Present on Admission: No     Pressure Injury 08/16/19 Lip Lower;Mid Stage I -  Intact skin with non-blanchable redness of a localized area usually over a bony prominence. healing  (Active)  08/16/19 2342  Location: Lip  Location Orientation: Lower;Mid  Staging: Stage I -  Intact skin with non-blanchable redness of a localized area usually over a bony prominence.  Wound Description (Comments): healing   Present on Admission:      Pressure Injury 08/24/19 Buttocks Right;Left;Mid Deep  Tissue Injury - Purple or maroon localized area of discolored intact skin or blood-filled blister due to damage of underlying soft tissue  from pressure and/or shear. (Active)  08/24/19 0800  Location: Buttocks  Location Orientation: Right;Left;Mid  Staging: Deep Tissue Injury - Purple or maroon localized area of discolored intact skin or blood-filled blister due to damage of underlying soft tissue from pressure and/or shear.  Wound Description (Comments):   Present on Admission: No    DVT Prophylaxis: Subcutaneous heparin Code Status: DNR Family Communication: None at bedside Disposition Plan: Per PT/OT   Medications:  Scheduled: . chlorhexidine  15 mL Mouth/Throat BID  . Chlorhexidine Gluconate Cloth  6 each Topical Daily  . darbepoetin (ARANESP) injection - NON-DIALYSIS  100 mcg Subcutaneous Q Wed-1800  . famotidine  10.4 mg Per Tube Daily  . feeding supplement (PRO-STAT SUGAR FREE 64)  60 mL Per Tube TID  . Gerhardt's butt cream   Topical TID  . heparin injection (subcutaneous)  7,500 Units Subcutaneous Q8H  . insulin aspart  0-9 Units Subcutaneous Q4H  . mouth rinse  15 mL Mouth Rinse 10 times per day  . sucralfate  1 g Oral Q6H   Continuous: . sodium chloride Stopped (08/21/19 1920)  . sodium chloride 10 mL/hr at 08/25/19 2118  . sodium chloride    . sodium chloride    .  ceFAZolin (ANCEF) IV    . dextrose Stopped (08/16/19 1623)  . feeding supplement (VITAL 1.5 CAL) 60 mL/hr at 08/26/19 1100  . phenylephrine (NEO-SYNEPHRINE) Adult infusion 150 mcg/min (08/26/19 1100)   PPI:RJJOAC chloride, sodium chloride, sodium chloride, sodium chloride, acetaminophen (TYLENOL) oral liquid 160 mg/5 mL, alteplase, dextrose, heparin, heparin, heparin, lidocaine (PF), lidocaine-prilocaine, metoprolol tartrate, pentafluoroprop-tetrafluoroeth, pneumococcal 23 valent vaccine, polyethylene glycol   Objective:  Vital Signs  Vitals:   08/26/19 1045 08/26/19 1048 08/26/19 1100 08/26/19 1115  BP: 103/71 104/72 105/62 117/69  Pulse: 96  88 89  Resp: (!) 26  (!) 24 (!) 25  Temp:  98.6 F (37 C)    TempSrc:  Axillary     SpO2: 97%  99% 96%  Weight:      Height:        Intake/Output Summary (Last 24 hours) at 08/26/2019 1128 Last data filed at 08/26/2019 1100 Gross per 24 hour  Intake 2030.39 ml  Output 1119 ml  Net 911.39 ml   Filed Weights   08/23/19 0441 08/24/19 0500 08/26/19 0700  Weight: 77.5 kg 72.9 kg 79.8 kg     General:  Opens eyes when name called, lethargic, does not follow commands, deconditioned, acutely ill-appearing  Cardiovascular: S1, S2 present  Respiratory:  Coarse breath sounds bilaterally  Abdomen: Soft, nontender, nondistended, bowel sounds present  Musculoskeletal: Trace bilateral pedal edema noted  Skin:  Multiple pressure ulcers as noted above  Psychiatry:  Unable to assess  Neurology: Unable to follow commands    Lab Results:  Data Reviewed: I have personally reviewed following labs and imaging studies  CBC: Recent Labs  Lab 08/21/19 0500  08/22/19 0415 08/23/19 0415 08/23/19 0453 08/24/19 0515 08/25/19 0517 08/26/19 0724  WBC 10.4  --  8.5 7.8  --  9.2 10.2 9.8  NEUTROABS 8.3*  --  6.3 5.7  --  6.9 8.1*  --   HGB 9.0*   < > 8.3* 8.3* 8.2* 8.7* 8.6* 8.3*  HCT 29.4*   < > 27.0* 27.1* 24.0* 28.6* 28.0* 27.2*  MCV 95.1  --  97.5 96.4  --  95.3 96.2 96.5  PLT 181  --  175 179  --  194 246 295   < > = values in this interval not displayed.    Basic Metabolic Panel: Recent Labs  Lab 08/21/19 0500  08/22/19 0415  08/23/19 0415 08/23/19 0453 08/23/19 1550 08/24/19 0515 08/25/19 0517 08/26/19 0724  NA 138   < > 137   < > 136 136 136 137 138 140  K 4.7   < > 5.0   < > 4.6 4.6 4.5 4.4 4.9 6.2*  CL 102   < > 103   < > 102  --  104 103 103 105  CO2 28   < > 28   < > 26  --  _0 19*  GLUCOSE 147*   < > 140*   < > 124*  --  103* 117* 123* 139*  BUN 74*   < > 48*   < > 45*  --  41* 38* 100* 145*  CREATININE 3.00*   < > 2.37*   < > 2.01*  --  2.02* 1.60* 4.02* 6.43*  CALCIUM 8.6*   < > 8.4*   < > 8.5*  --  8.5* 8.7* 9.5 9.6  MG 2.4  --  2.5*   --  2.5*  --   --  2.6* 2.9*  --   PHOS 5.0*   < > 3.9   < > 3.5  --  3.3 2.9 5.7* 9.5*   < > = values in this interval not displayed.    GFR: Estimated Creatinine Clearance: 11.7 mL/min (A) (by C-G formula based on SCr of 6.43 mg/dL (H)).  Liver Function Tests: Recent Labs  Lab 08/22/19 1750 08/23/19 0415 08/23/19 1550 08/24/19 0515 08/26/19 0724  ALBUMIN 2.1* 2.3* 2.4* 2.5* 2.1*     CBG: Recent Labs  Lab 08/25/19 1600 08/25/19 1952 08/25/19 2348 08/26/19 0330 08/26/19 0810  GLUCAP 132* 104* 132* 119* 132*      Recent Results (from the past 240 hour(s))  Culture, blood (Routine X 2) w Reflex to ID Panel     Status: None   Collection Time: 08/20/19 12:35 PM   Specimen: BLOOD  Result Value Ref Range Status   Specimen Description   Final    BLOOD LEFT HAND Performed at [facility], [place] [address] [place], [place] [address]    Special Requests   Final    BOTTLES DRAWN AEROBIC AND ANAEROBIC Blood Culture adequate volume Performed at [facility] [address], [place], [place] [address]    Culture   Final    NO GROWTH 5 DAYS Performed at [facility], [place] [address], [place], [place] [address]    Report Status 08/25/2019 FINAL  Final  Culture, blood (Routine X 2) w Reflex to ID Panel     Status: None   Collection Time: 08/20/19 12:48 PM   Specimen: BLOOD  Result Value Ref Range Status   Specimen Description   Final    BLOOD LEFT ARM Performed at [facility] [address] [place], [place] [address]    Special Requests   Final    BOTTLES DRAWN AEROBIC ONLY Blood Culture adequate volume Performed at [facility] [address], [place], [place] [address]    Culture   Final    NO GROWTH 5 DAYS Performed at [facility], [place] [address] [place], [place] [address]  Report Status 08/25/2019 FINAL  Final  Culture, respiratory     Status: None   Collection Time:  08/20/19 12:54 PM   Specimen: Tracheal Aspirate  Result Value Ref Range Status   Specimen Description   Final    TRACHEAL ASPIRATE Performed at [facility] [address], [place], [place] [address]    Special Requests   Final    NONE Performed at [facility], [place] [address], [place], [place] [address]    Gram Stain   Final    ABUNDANT WBC PRESENT,BOTH PMN AND MONONUCLEAR NO ORGANISMS SEEN Performed at [facility], [place] [address], [place],  [address]    Culture RARE PSEUDOMONAS AERUGINOSA  Final   Report Status 08/23/2019 FINAL  Final   Organism ID, Bacteria PSEUDOMONAS AERUGINOSA  Final      Susceptibility   Pseudomonas aeruginosa - MIC*    CEFTAZIDIME 4 SENSITIVE Sensitive     CIPROFLOXACIN <=0.25 SENSITIVE Sensitive     GENTAMICIN <=1 SENSITIVE Sensitive     IMIPENEM 1 SENSITIVE Sensitive     PIP/TAZO 8 SENSITIVE Sensitive     CEFEPIME 4 SENSITIVE Sensitive     * RARE PSEUDOMONAS AERUGINOSA  SARS CORONAVIRUS 2 (TAT 6-24 HRS) Nasopharyngeal Nasopharyngeal Swab     Status: None   Collection Time: 08/24/19  1:55 PM   Specimen: Nasopharyngeal Swab  Result Value Ref Range Status   SARS Coronavirus 2 NEGATIVE NEGATIVE Final    Comment: (NOTE) SARS-CoV-2 target nucleic acids are NOT DETECTED. The SARS-CoV-2 RNA is generally detectable in upper and lower respiratory specimens during the acute phase of infection. Negative results do not preclude SARS-CoV-2 infection, do not rule out co-infections with other pathogens, and should not be used as the sole basis for treatment or other patient management decisions. Negative results must be combined with clinical observations, patient history, and epidemiological information. The expected result is Negative. Fact Sheet for Patients: SugarRoll.be Fact Sheet for Healthcare Providers: https://www.woods-mathews.com/ This test is not yet  approved or cleared by the [place] FDA and  has been authorized for detection and/or diagnosis of SARS-CoV-2 by FDA under an Emergency Use Authorization (EUA). This EUA will remain  in effect (meaning this test can be used) for the duration of the COVID-19 declaration under Section 56 4(b)(1) of the Act, 21 U.S.C. section 360bbb-3(b)(1), unless the authorization is terminated or revoked sooner. Performed at [facility], [place] [address] [place], [place] [address]   SARS CORONAVIRUS 2 (TAT 6-24 HRS) Nasopharyngeal Nasopharyngeal Swab     Status: None   Collection Time: 08/25/19 11:37 AM   Specimen: Nasopharyngeal Swab  Result Value Ref Range Status   SARS Coronavirus 2 NEGATIVE NEGATIVE Final    Comment: (NOTE) SARS-CoV-2 target nucleic acids are NOT DETECTED. The SARS-CoV-2 RNA is generally detectable in upper and lower respiratory specimens during the acute phase of infection. Negative results do not preclude SARS-CoV-2 infection, do not rule out co-infections with other pathogens, and should not be used as the sole basis for treatment or other patient management decisions. Negative results must be combined with clinical observations, patient history, and epidemiological information. The expected result is Negative. Fact Sheet for Patients: SugarRoll.be Fact Sheet for Healthcare Providers: https://www.woods-mathews.com/ This test is not yet approved or cleared by the [place] FDA and  has been authorized for detection and/or diagnosis of SARS-CoV-2 by FDA under an Emergency Use Authorization (EUA). This EUA will remain  in effect (  meaning this test can be used) for the duration of the COVID-19 declaration under Section 56 4(b)(1) of the Act, 21 U.S.C. section 360bbb-3(b)(1), unless the authorization is terminated or revoked sooner. Performed at [facility], [place] [address], [place], [place] [address]        Radiology Studies: Dg Chest Port 1 View  Result Date: 08/26/2019 CLINICAL DATA:  Pneumothorax. EXAM: PORTABLE CHEST 1 VIEW COMPARISON:  08/23/2019. FINDINGS: Tracheostomy tube, left IJ line, feeding tube, right chest tube in stable position. Stable right base pneumothorax. Stable atelectatic changes/infiltrate right upper lung and left base. Heart size stable. IMPRESSION: 1. Lines and tubes including right chest tube in stable position. Stable right base pneumothorax. 2. Stable right upper lung and left lower atelectatic changes/infiltrates. Chest is unchanged from prior exam. Electronically Signed   By: [place]   On: 08/26/2019 10:36       LOS: 32 days   Alma Friendly, MD  [facility] Pager on [email or web address]  08/26/2019, 11:28 AM

## 2019-08-27 ENCOUNTER — Inpatient Hospital Stay (HOSPITAL_COMMUNITY): Payer: Medicare HMO

## 2019-08-27 DIAGNOSIS — R4182 Altered mental status, unspecified: Secondary | ICD-10-CM

## 2019-08-27 DIAGNOSIS — A4901 Methicillin susceptible Staphylococcus aureus infection, unspecified site: Secondary | ICD-10-CM | POA: Diagnosis not present

## 2019-08-27 DIAGNOSIS — E872 Acidosis: Secondary | ICD-10-CM | POA: Diagnosis not present

## 2019-08-27 DIAGNOSIS — Z930 Tracheostomy status: Secondary | ICD-10-CM | POA: Diagnosis not present

## 2019-08-27 DIAGNOSIS — D649 Anemia, unspecified: Secondary | ICD-10-CM | POA: Diagnosis not present

## 2019-08-27 DIAGNOSIS — U071 COVID-19: Secondary | ICD-10-CM | POA: Diagnosis not present

## 2019-08-27 DIAGNOSIS — N401 Enlarged prostate with lower urinary tract symptoms: Secondary | ICD-10-CM | POA: Diagnosis not present

## 2019-08-27 DIAGNOSIS — J9600 Acute respiratory failure, unspecified whether with hypoxia or hypercapnia: Secondary | ICD-10-CM | POA: Diagnosis not present

## 2019-08-27 DIAGNOSIS — I639 Cerebral infarction, unspecified: Secondary | ICD-10-CM

## 2019-08-27 DIAGNOSIS — I959 Hypotension, unspecified: Secondary | ICD-10-CM | POA: Diagnosis not present

## 2019-08-27 DIAGNOSIS — J80 Acute respiratory distress syndrome: Secondary | ICD-10-CM | POA: Diagnosis not present

## 2019-08-27 DIAGNOSIS — K250 Acute gastric ulcer with hemorrhage: Secondary | ICD-10-CM | POA: Diagnosis not present

## 2019-08-27 DIAGNOSIS — Z9689 Presence of other specified functional implants: Secondary | ICD-10-CM | POA: Diagnosis not present

## 2019-08-27 DIAGNOSIS — N179 Acute kidney failure, unspecified: Secondary | ICD-10-CM | POA: Diagnosis not present

## 2019-08-27 DIAGNOSIS — J9311 Primary spontaneous pneumothorax: Secondary | ICD-10-CM | POA: Diagnosis not present

## 2019-08-27 DIAGNOSIS — R579 Shock, unspecified: Secondary | ICD-10-CM | POA: Diagnosis not present

## 2019-08-27 DIAGNOSIS — J1289 Other viral pneumonia: Secondary | ICD-10-CM | POA: Diagnosis not present

## 2019-08-27 LAB — CBC WITH DIFFERENTIAL/PLATELET
Abs Immature Granulocytes: 0.47 10*3/uL — ABNORMAL HIGH (ref 0.00–0.07)
Basophils Absolute: 0 10*3/uL (ref 0.0–0.1)
Basophils Relative: 0 %
Eosinophils Absolute: 0.4 10*3/uL (ref 0.0–0.5)
Eosinophils Relative: 4 %
HCT: 25 % — ABNORMAL LOW (ref 39.0–52.0)
Hemoglobin: 7.8 g/dL — ABNORMAL LOW (ref 13.0–17.0)
Immature Granulocytes: 6 %
Lymphocytes Relative: 11 %
Lymphs Abs: 0.9 10*3/uL (ref 0.7–4.0)
MCH: 29.4 pg (ref 26.0–34.0)
MCHC: 31.2 g/dL (ref 30.0–36.0)
MCV: 94.3 fL (ref 80.0–100.0)
Monocytes Absolute: 0.5 10*3/uL (ref 0.1–1.0)
Monocytes Relative: 5 %
Neutro Abs: 6.1 10*3/uL (ref 1.7–7.7)
Neutrophils Relative %: 74 %
Platelets: 272 10*3/uL (ref 150–400)
RBC: 2.65 MIL/uL — ABNORMAL LOW (ref 4.22–5.81)
RDW: 16.7 % — ABNORMAL HIGH (ref 11.5–15.5)
WBC: 8.3 10*3/uL (ref 4.0–10.5)
nRBC: 2.3 % — ABNORMAL HIGH (ref 0.0–0.2)

## 2019-08-27 LAB — GLUCOSE, CAPILLARY
Glucose-Capillary: 110 mg/dL — ABNORMAL HIGH (ref 70–99)
Glucose-Capillary: 113 mg/dL — ABNORMAL HIGH (ref 70–99)
Glucose-Capillary: 119 mg/dL — ABNORMAL HIGH (ref 70–99)
Glucose-Capillary: 119 mg/dL — ABNORMAL HIGH (ref 70–99)
Glucose-Capillary: 123 mg/dL — ABNORMAL HIGH (ref 70–99)
Glucose-Capillary: 128 mg/dL — ABNORMAL HIGH (ref 70–99)
Glucose-Capillary: 137 mg/dL — ABNORMAL HIGH (ref 70–99)

## 2019-08-27 LAB — BASIC METABOLIC PANEL
Anion gap: 17 — ABNORMAL HIGH (ref 5–15)
BUN: 91 mg/dL — ABNORMAL HIGH (ref 8–23)
CO2: 21 mmol/L — ABNORMAL LOW (ref 22–32)
Calcium: 9.3 mg/dL (ref 8.9–10.3)
Chloride: 101 mmol/L (ref 98–111)
Creatinine, Ser: 4.44 mg/dL — ABNORMAL HIGH (ref 0.61–1.24)
GFR calc Af Amer: 15 mL/min — ABNORMAL LOW (ref >60)
GFR calc non Af Amer: 13 mL/min — ABNORMAL LOW (ref >60)
Glucose, Bld: 117 mg/dL — ABNORMAL HIGH (ref 70–99)
Potassium: 4.8 mmol/L (ref 3.5–5.1)
Sodium: 139 mmol/L (ref 135–145)

## 2019-08-27 LAB — PATHOLOGIST SMEAR REVIEW

## 2019-08-27 MED ORDER — ASPIRIN 325 MG PO TABS
325.0000 mg | ORAL_TABLET | Freq: Every day | ORAL | Status: DC
  Administered 2019-08-27 – 2019-09-20 (×25): 325 mg
  Filled 2019-08-27 (×25): qty 1

## 2019-08-27 MED ORDER — PRO-STAT SUGAR FREE PO LIQD
60.0000 mL | Freq: Two times a day (BID) | ORAL | Status: DC
  Administered 2019-08-27 – 2019-09-03 (×14): 60 mL
  Filled 2019-08-27 (×14): qty 60

## 2019-08-27 MED ORDER — FAMOTIDINE 40 MG/5ML PO SUSR
20.0000 mg | Freq: Every day | ORAL | Status: DC
  Administered 2019-08-27 – 2019-08-31 (×5): 20 mg
  Filled 2019-08-27 (×6): qty 2.5

## 2019-08-27 MED ORDER — VITAL 1.5 CAL PO LIQD
1000.0000 mL | ORAL | Status: DC
  Administered 2019-08-27 – 2019-09-03 (×8): 1000 mL
  Filled 2019-08-27 (×12): qty 1000

## 2019-08-27 NOTE — Progress Notes (Signed)
  Speech Language Pathology Treatment: Nada Boozer Speaking valve  Patient Details Name: [name] MRN: [id number] DOB: [birth year] Today's Date: 08/27/2019 Time: 6283-1517 SLP Time Calculation (min) (ACUTE ONLY): 40 min  Assessment / Plan / Recommendation Clinical Impression  Pt demonstrates excellent tolerance of PMSV over a 30 minute interval with no changes in vital signs, no indication of air trapping behind valve. Pt able to redirect air to upper airway given throt clearing and movement of air through nares. Max cueing given to assist in phonation though pt effort minimal. Wife did report pt said Yes once when SLP and RN were talking. Pt may wear PMSV with full supervision from staff or wife who was instructed in [place] removal if needed, but was directed to call RN to remove PMSV when she leaves. See next note for further details regarding cognitive linguistic function.   HPI HPI: 68 year old with a history of GERD and BPH who presented to [facility], ED with S OB and was found to be hypoxic.  He was initially diagnosed with COVID-19 September 20.  1 to 2 days prior to his presentation he developed worsening shortness of breath with anorexia. Admitted on 10/3, intubated.  EGD on 10/17 showed normal esophagus but excessive gastric fluid. CT scan of the chest on 10/21 showed findings consistent with an empyema as well as persistent pneumothorax.  Bedside bronchoscopy revealed material in the airway. Trached on 10/30. On ATC by 10/31.      SLP Plan  Continue with current plan of care       Recommendations         Patient may use Passy-Muir Speech Valve: Intermittently with supervision;During all therapies with supervision PMSV Supervision: Full MD: Please consider changing trach tube to : Smaller size;Cuffless         General recommendations: Rehab consult Oral Care Recommendations: Oral care BID Follow up Recommendations: Inpatient Rehab Plan: Continue with current plan of  care       GO               [name], MA [place] Pager [phone] Office [phone]  Lynann Beaver 08/27/2019, 2:08 PM

## 2019-08-27 NOTE — Progress Notes (Signed)
Patient ID: [name], male   DOB: 02/04/1951, 68 y.o.   MRN: [id number] S: Pt was hypotensive on HD and required pressors and then had a significant change in mental status: MRI revealed acute/subacute bilateral cerebral and right cerebellum infarcts. O:BP 103/66   Pulse 96   Temp 98.2 F (36.8 C) (Axillary)   Resp (!) 21   Ht 5\' 10"  (1.778 m)   Wt 79.8 kg   SpO2 99%   BMI 25.24 kg/m   Intake/Output Summary (Last 24 hours) at 08/27/2019 1015 Last data filed at 08/27/2019 0800 Gross per 24 hour  Intake 1766.48 ml  Output 619 ml  Net 1147.48 ml   Intake/Output: I/O last 3 completed shifts: In: 2817.1 [I.V.:637.1; NG/GT:2130; IV Piggyback:50] Out: 1169 [Other:369; Stool:800]  Intake/Output this shift:  Total I/O In: 60 [NG/GT:60] Out: -  Weight change:  Gen: lying in bed, fatigued but verbal CVS: no rub Resp: scattered rhonchi and transmitted upper airway sounds Abd: +BS, soft, NT/ND Ext: trace edema  Recent Labs  Lab 08/21/19 1558  08/22/19 0415 08/22/19 1750 08/23/19 0415 08/23/19 0453 08/23/19 1550 08/24/19 0515 08/25/19 0517 08/26/19 0724 08/27/19 0336  NA 138   < > 137 138 136 136 136 137 138 140 139  K 5.0   < > 5.0 5.9* 4.6 4.6 4.5 4.4 4.9 6.2* 4.8  CL 104  --  103 103 102  --  104 103 103 105 101  CO2 27  --  28 25 26   --  25 26 22  19* 21*  GLUCOSE 150*  --  140* 107* 124*  --  103* 117* 123* 139* 117*  BUN 61*  --  48* 56* 45*  --  41* 38* 100* 145* 91*  CREATININE 2.79*  --  2.37* 2.92* 2.01*  --  2.02* 1.60* 4.02* 6.43* 4.44*  ALBUMIN 2.2*  --  2.1* 2.1* 2.3*  --  2.4* 2.5*  --  2.1*  --   CALCIUM 8.5*  --  8.4* 8.8* 8.5*  --  8.5* 8.7* 9.5 9.6 9.3  PHOS 3.9  --  3.9 7.1* 3.5  --  3.3 2.9 5.7* 9.5*  --    < > = values in this interval not displayed.   Liver Function Tests: Recent Labs  Lab 08/23/19 1550 08/24/19 0515 08/26/19 0724  ALBUMIN 2.4* 2.5* 2.1*   No results for input(s): LIPASE, AMYLASE in the last 168 hours. No results for  input(s): AMMONIA in the last 168 hours. CBC: Recent Labs  Lab 08/23/19 0415  08/24/19 0515 08/25/19 0517 08/26/19 0724 08/27/19 0336  WBC 7.8  --  9.2 10.2 9.8 8.3  NEUTROABS 5.7  --  6.9 8.1*  --  6.1  HGB 8.3*   < > 8.7* 8.6* 8.3* 7.8*  HCT 27.1*   < > 28.6* 28.0* 27.2* 25.0*  MCV 96.4  --  95.3 96.2 96.5 94.3  PLT 179  --  194 246 295 272   < > = values in this interval not displayed.   Cardiac Enzymes: No results for input(s): CKTOTAL, CKMB, CKMBINDEX, TROPONINI in the last 168 hours. CBG: Recent Labs  Lab 08/26/19 1554 08/26/19 2007 08/27/19 0033 08/27/19 0423 08/27/19 0715  GLUCAP 125* 114* 128* 123* 110*    Iron Studies: No results for input(s): IRON, TIBC, TRANSFERRIN, FERRITIN in the last 72 hours. Studies/Results: Mr Brain Wo Contrast  Result Date: 08/26/2019 CLINICAL DATA:  Altered level of consciousness. History of COVID-19. EXAM: MRI HEAD WITHOUT CONTRAST  TECHNIQUE: Multiplanar, multiecho pulse sequences of the brain and surrounding structures were obtained without intravenous contrast. COMPARISON:  Head CT 01/05/2014 FINDINGS: Brain: There is cortical restricted diffusion posteriorly in the right frontal lobe consistent with acute infarct. Additional smaller regions of diffusion abnormality are present in the posterior left frontal lobe, anterior left frontal lobe, and right greater than left medial parietal lobes as well as right cerebellum with normal to slightly reduced ADC compatible with acute to early subacute infarcts. There is mild associated T2 hyperintensity/edema associated with the infarcts. A chronic microhemorrhage is noted in the region of the right external capsule. No mass, midline shift, or extra-axial fluid collection is identified. The ventricles and sulci are within normal limits for age. The pituitary gland is borderline prominent in height without a discrete sellar mass identified on this nondedicated study. Vascular: Major intracranial vascular  flow voids are preserved. Skull and upper cervical spine: Unremarkable bone marrow signal. Sinuses/Orbits: Unremarkable orbits. Large bilateral mastoid effusions. Mild left frontal sinus mucosal thickening. Other: None. IMPRESSION: 1. Small acute to early subacute infarcts involving bilateral cerebral cortex and right cerebellum. 2. Large bilateral mastoid effusions. Electronically Signed   By: Logan Bores M.D.   On: 08/26/2019 19:33   Dg Chest Port 1 View  Result Date: 08/26/2019 CLINICAL DATA:  Pneumothorax. EXAM: PORTABLE CHEST 1 VIEW COMPARISON:  08/23/2019. FINDINGS: Tracheostomy tube, left IJ line, feeding tube, right chest tube in stable position. Stable right base pneumothorax. Stable atelectatic changes/infiltrate right upper lung and left base. Heart size stable. IMPRESSION: 1. Lines and tubes including right chest tube in stable position. Stable right base pneumothorax. 2. Stable right upper lung and left lower atelectatic changes/infiltrates. Chest is unchanged from prior exam. Electronically Signed   By: Marcello Moores  Register   On: 08/26/2019 10:36   . chlorhexidine  15 mL Mouth/Throat BID  . Chlorhexidine Gluconate Cloth  6 each Topical Daily  . darbepoetin (ARANESP) injection - NON-DIALYSIS  100 mcg Subcutaneous Q Wed-1800  . famotidine  20 mg Per Tube Daily  . feeding supplement (PRO-STAT SUGAR FREE 64)  60 mL Per Tube TID  . Gerhardt's butt cream   Topical TID  . heparin injection (subcutaneous)  7,500 Units Subcutaneous Q8H  . insulin aspart  0-9 Units Subcutaneous Q4H  . mouth rinse  15 mL Mouth Rinse 10 times per day  . sucralfate  1 g Oral Q6H    BMET    Component Value Date/Time   NA 139 08/27/2019 0336   NA 143 11/08/2017 1030   K 4.8 08/27/2019 0336   CL 101 08/27/2019 0336   CO2 21 (L) 08/27/2019 0336   GLUCOSE 117 (H) 08/27/2019 0336   BUN 91 (H) 08/27/2019 0336   BUN 8 11/08/2017 1030   CREATININE 4.44 (H) 08/27/2019 0336   CREATININE 1.15 11/20/2014 1224    CALCIUM 9.3 08/27/2019 0336   GFRNONAA 13 (L) 08/27/2019 0336   GFRAA 15 (L) 08/27/2019 0336   CBC    Component Value Date/Time   WBC 8.3 08/27/2019 0336   RBC 2.65 (L) 08/27/2019 0336   HGB 7.8 (L) 08/27/2019 0336   HGB 15.8 11/08/2017 1030   HCT 25.0 (L) 08/27/2019 0336   HCT 46.6 11/08/2017 1030   PLT 272 08/27/2019 0336   PLT 202 11/08/2017 1030   MCV 94.3 08/27/2019 0336   MCV 93 11/08/2017 1030   MCH 29.4 08/27/2019 0336   MCHC 31.2 08/27/2019 0336   RDW 16.7 (H) 08/27/2019 0336  RDW 14.2 11/08/2017 1030   LYMPHSABS 0.9 08/27/2019 0336   LYMPHSABS 1.1 11/08/2017 1030   MONOABS 0.5 08/27/2019 0336   EOSABS 0.4 08/27/2019 0336   EOSABS 0.1 11/08/2017 1030   BASOSABS 0.0 08/27/2019 0336   BASOSABS 0.0 11/08/2017 1030    Assessment/Plan:  1. AKI/CKD stage 3- in setting of covid PNA and has remained oliguric/anuric. Started on CVVHD 08/07/19-08/24/19. He is off pressors but remains anuric with rising BUN/Cr.  1. He was transferred to [facility] for IHD 08/25/19 2. Seen on hd but low SBP and required pressors 3. Will likely have to resume CVVHD if BP remains an issue 2. COVID PNA- treated with remdesivir, dexamethasone, and convalescent plasma. Most recent covid test was negative. 3. VDRF - s/p trach and on vent per PCCM 4. Sepsis/shock- off of pressors 5. Right pneumothorax/emphyema/necrotic RLL- s/p chest tube with air leak. Per PCCM and CT Surgery. 6. New cerebral and cerebellar strokes- per primary svc. 7. A fib/RVR- per PCCM 8. GIB- acute s/p EGD which showed gastric ulcer with stigmata of recent bleeding s/p injection and clipping. Able to tolerate low dose heparin with CVVHD before stopping it. 9. MSSA bacteremia- treated 10. Acinetobacter and pseudomonal pna- per PCCM, meropenem for 7 days. 11. Disposition- per PCCM Donetta Potts, MD [facility] [phone]

## 2019-08-27 NOTE — Progress Notes (Signed)
Notified RRT Richardson Landry of large blood clot suctioned via inline. Inline changed at this time, no notable resp distress with sats in the high 90s

## 2019-08-27 NOTE — Consult Note (Addendum)
Neurology Consultation  Reason for Consult: Stroke found on MRI Referring Physician: Wyline Copas  CC: Altered mental status  History is obtained from: Chart  HPI: [name] is a 68 y.o. male with history of heartburn and recently diagnosed with COVID-19.  Patient has been in the hospital for some time.  He presented on 07/17/2019 for shortness of breath and hypoxia.  At that time he had mental status changes and was intubated for refractory hypoxia.  Transferred to [facility].  While hospitalized patient has had a multitude of medical issues including right pneumothorax, AKI, encephalopathy, MSSA bacteremia but echocardiogram negative.  Per notes dating back to 08/19/2019 patient has only been able to open his eyes and not move any extremities.  As of 08/24/2019 patient was able to open his eyes and track practitioner but not following commands.Over the night he was noticed to be more unresponsive. Thus MRI was obtained.  MRI did show small acute to early subacute infarcts involving bilateral cerebral cortex and right cerebellum  At present time patient is very much unresponsive to any verbal stimuli and having intermittent severe coughing spells.  Neurology was consulted to evaluate for patient's strokes revealed on MRI.  LKW: Unknown-at some point in the night between 08/26/2019 and 08/27/2019 tpa given?: no, no last known normal Premorbid modified Rankin scale (mRS): 5 NIH stroke scale 31   Past Medical History:  Diagnosis Date  . COVID-19   . Heartburn      Family History  Problem Relation Age of Onset  . Colon cancer Neg Hx   . Colon polyps Neg Hx    Social History:   reports that he has never smoked. He has never used smokeless tobacco. He reports current alcohol use of about 2.0 standard drinks of alcohol per week. He reports that he does not use drugs.  Medications  Current Facility-Administered Medications:  .  0.9 %  sodium chloride infusion, , Intravenous, PRN, Cherene Altes, MD, Stopped at 08/21/19 1920 .  0.9 %  sodium chloride infusion, , Intravenous, PRN, Bonnielee Haff, MD, Last Rate: 10 mL/hr at 08/25/19 2118 .  0.9 %  sodium chloride infusion, 100 mL, Intravenous, PRN, Donato Heinz, MD .  0.9 %  sodium chloride infusion, 100 mL, Intravenous, PRN, Donato Heinz, MD .  acetaminophen (TYLENOL) solution 650 mg, 650 mg, Per Tube, Q4H PRN, Little Ishikawa, MD, 650 mg at 08/22/19 1405 .  alteplase (CATHFLO ACTIVASE) injection 2 mg, 2 mg, Intracatheter, Once PRN, Donato Heinz, MD .  ceFAZolin (ANCEF) IVPB 1 g/50 mL premix, 1 g, Intravenous, Q24H, Comer, Okey Regal, MD, Stopped at 08/26/19 2250 .  chlorhexidine (PERIDEX) 0.12 % solution 15 mL, 15 mL, Mouth/Throat, BID, Little Ishikawa, MD, 15 mL at 08/27/19 0757 .  Chlorhexidine Gluconate Cloth 2 % PADS 6 each, 6 each, Topical, Daily, Kristopher Oppenheim, DO, 6 each at 08/26/19 1231 .  Darbepoetin Alfa (ARANESP) injection 100 mcg, 100 mcg, Subcutaneous, Q Wed-1800, Roney Jaffe, MD, 100 mcg at 08/20/19 1837 .  dextrose 10 % infusion, , Intravenous, Continuous PRN, Cherene Altes, MD, Stopped at 08/16/19 1623 .  famotidine (PEPCID) 40 MG/5ML suspension 20 mg, 20 mg, Per Tube, Daily, Masters, Jake Church, RPH, 20 mg at 08/27/19 1009 .  feeding supplement (PRO-STAT SUGAR FREE 64) liquid 60 mL, 60 mL, Per Tube, BID, Donne Hazel, MD .  feeding supplement (VITAL 1.5 CAL) liquid 1,000 mL, 1,000 mL, Per Tube, Continuous, Donne Hazel, MD, Last Rate: 55  mL/hr at 08/27/19 1338, 1,000 mL at 08/27/19 1338 .  Gerhardt's butt cream, , Topical, TID, Bonnielee Haff, MD, 1 application at 91/47/82 1013 .  heparin injection 1,000 Units, 1,000 Units, Dialysis, PRN, Donato Heinz, MD .  heparin injection 1,000-6,000 Units, 1,000-6,000 Units, CRRT, PRN, Cherene Altes, MD, 3,000 Units at 08/24/19 0956 .  heparin injection 1,500 Units, 20 Units/kg, Dialysis, PRN, Donato Heinz, MD, 1,500  Units at 08/26/19 0805 .  heparin injection 7,500 Units, 7,500 Units, Subcutaneous, Q8H, Shade, Haze Justin, RPH, 7,500 Units at 08/27/19 1348 .  insulin aspart (novoLOG) injection 0-9 Units, 0-9 Units, Subcutaneous, Q4H, Allie Bossier, MD, 1 Units at 08/27/19 1213 .  lidocaine (PF) (XYLOCAINE) 1 % injection 5 mL, 5 mL, Intradermal, PRN, Donato Heinz, MD .  lidocaine-prilocaine (EMLA) cream 1 application, 1 application, Topical, PRN, Donato Heinz, MD .  MEDLINE mouth rinse, 15 mL, Mouth Rinse, 10 times per day, Kristopher Oppenheim, DO, 15 mL at 08/27/19 1014 .  metoprolol tartrate (LOPRESSOR) injection 5-10 mg, 5-10 mg, Intravenous, Q4H PRN, Cherene Altes, MD, 5 mg at 08/16/19 0432 .  pentafluoroprop-tetrafluoroeth (GEBAUERS) aerosol 1 application, 1 application, Topical, PRN, Coladonato, Joseph, MD .  phenylephrine (NEOSYNEPHRINE) 10-0.9 MG/250ML-% infusion, 0-400 mcg/min, Intravenous, Titrated, Donato Heinz, MD, Stopped at 08/26/19 1602 .  pneumococcal 23 valent vaccine (PNU-IMMUNE) injection 0.5 mL, 0.5 mL, Intramuscular, Prior to discharge, Little Ishikawa, MD .  polyethylene glycol (MIRALAX / GLYCOLAX) packet 17 g, 17 g, Per Tube, Daily PRN, Simonne Maffucci B, MD .  sucralfate (CARAFATE) 1 GM/10ML suspension 1 g, 1 g, Oral, Q6H, Buccini, Robert, MD, 1 g at 08/27/19 1212   Exam: Current vital signs: BP 123/75   Pulse 96   Temp 98.1 F (36.7 C) (Axillary)   Resp (!) 21   Ht 5\' 10"  (1.778 m)   Wt 79.8 kg   SpO2 99%   BMI 25.24 kg/m  Vital signs in last 24 hours: Temp:  [97.9 F (36.6 C)-98.8 F (37.1 C)] 98.1 F (36.7 C) (11/04 1129) Pulse Rate:  [73-104] 96 (11/04 1000) Resp:  [10-27] 21 (11/04 1000) BP: (94-134)/(52-83) 123/75 (11/04 1000) SpO2:  [96 %-100 %] 99 % (11/04 1000)  ROS:  Unable to obtain due to patient is nonverbal.     Physical Exam   Constitutional: Cachectic Eyes: No scleral injection HENT: No OP obstrucion Head: Normocephalic.   Cardiovascular: Normal rate and regular rhythm.  Respiratory: Labored GI: Soft.  No distension.  Skin: WDI  Neuro:--exam varies throughout time Mental Status: Patient is currently in bed, he looks as though he has labored breathing, he is having intermittent bouts of significant coughing.  He does not follow any verbal commands and or visual commands.  He only withdraws from pain. Cranial Nerves: II: I could not elicit a blink to threat on the left however I could elicit blink to threat on the right--however this is not consistent and he is very inattentive  III,IV, VI: Doll's eyes are intact however he does have a right gaze preference pupils equal, round and reactive to light V: Winces to noxious stimuli VII: Right facial droop VIII: Does not react to voice  Motor: All extremities have decreased bulk.  He does not withdraw from pain nor move spontaneously. Sensory: Patient does wince to noxious stimuli in all 4 extremities Deep Tendon Reflexes: no bilateral biceps with 1+ ankle jerks Plantars: Mute bilaterally Cerebellar: Unable to ascertain and/or test   Labs I have reviewed labs in epic  and the results pertinent to this consultation are:   CBC    Component Value Date/Time   WBC 8.3 08/27/2019 0336   RBC 2.65 (L) 08/27/2019 0336   HGB 7.8 (L) 08/27/2019 0336   HGB 15.8 11/08/2017 1030   HCT 25.0 (L) 08/27/2019 0336   HCT 46.6 11/08/2017 1030   PLT 272 08/27/2019 0336   PLT 202 11/08/2017 1030   MCV 94.3 08/27/2019 0336   MCV 93 11/08/2017 1030   MCH 29.4 08/27/2019 0336   MCHC 31.2 08/27/2019 0336   RDW 16.7 (H) 08/27/2019 0336   RDW 14.2 11/08/2017 1030   LYMPHSABS 0.9 08/27/2019 0336   LYMPHSABS 1.1 11/08/2017 1030   MONOABS 0.5 08/27/2019 0336   EOSABS 0.4 08/27/2019 0336   EOSABS 0.1 11/08/2017 1030   BASOSABS 0.0 08/27/2019 0336   BASOSABS 0.0 11/08/2017 1030    CMP     Component Value Date/Time   NA 139 08/27/2019 0336   NA 143 11/08/2017 1030    K 4.8 08/27/2019 0336   CL 101 08/27/2019 0336   CO2 21 (L) 08/27/2019 0336   GLUCOSE 117 (H) 08/27/2019 0336   BUN 91 (H) 08/27/2019 0336   BUN 8 11/08/2017 1030   CREATININE 4.44 (H) 08/27/2019 0336   CREATININE 1.15 11/20/2014 1224   CALCIUM 9.3 08/27/2019 0336   PROT 6.2 (L) 08/16/2019 0430   PROT 7.2 11/08/2017 1030   ALBUMIN 2.1 (L) 08/26/2019 0724   ALBUMIN 4.6 11/08/2017 1030   AST 38 08/16/2019 0430   ALT 8 08/16/2019 0430   ALKPHOS 71 08/16/2019 0430   BILITOT 0.6 08/16/2019 0430   BILITOT 0.4 11/08/2017 1030   GFRNONAA 13 (L) 08/27/2019 0336   GFRAA 15 (L) 08/27/2019 0336    Lipid Panel     Component Value Date/Time   CHOL 211 (H) 11/08/2017 1030   TRIG 99 07/25/2019 1656   HDL 46 11/08/2017 1030   CHOLHDL 4.6 11/08/2017 1030   CHOLHDL 4.5 11/20/2014 1224   VLDL 16 11/20/2014 1224   LDLCALC 149 (H) 11/08/2017 1030     Imaging I have reviewed the images obtained:  Echocardiogram obtained on 08/11/2019-left ventricular ejection fraction 60 to 65%.  At that time left ventricular function was normal  MRI examination of the brain-small acute to early subacute infarcts involving bilateral cerebral cortex and right cerebellum  Etta Quill PA-C [facility] [phone]  M-F  (9:00 am- 5:00 PM)  08/27/2019, 2:07 PM     Assessment:  The very unfortunate 68 year old male who has been in the hospital for prolonged period of time.  While he is been in the hospital patient has suffered from Covid 19, right pneumothorax, AKI, neuromuscular weakness, MSSA bacteremia with echocardiogram negative, now with MRI showing bilateral cerebral cortex and right cerebellum infarcts.  Exam as above shows patient is only responsive to noxious stimuli.   Current clinical presentation is likely a combination of Covid encephalopathy, prolonged hospitalization causing deconditioning and critical care myopathy/neuropathy and bilateral strokes.  Recommend #MRA Head and neck   #Transthoracic Echo, # Start patient on ASA 325mg  daily,  #Start  Atorvastatin 80 mg/other high intensity statin when able to take p.o. medications # BP goal: permissive HTN upto 220/120 mmHg # HBAIC and Lipid profile # Telemetry monitoring # Frequent neuro checks # NPO until passes stroke swallow screen #Obtain routine EEG to rule out seizures as the strokes are very cortical. # please page stroke NP  Or  PA  Or MD from 8am -  4 pm  as this patient from this time will be  followed by the stroke.   You can look them up on [email or web address]  [place]   Attending Neurohospitalist Addendum Patient seen and examined with APP/Resident. Agree with the history and physical as documented above. Agree with the plan as documented, which I helped formulate. I have independently reviewed the chart, obtained history, review of systems and examined the patient.I have personally reviewed pertinent head/neck/spine imaging (CT/MRI). Please feel free to call with any questions. --- Amie Portland, MD [facility] Pager: [phone]  If 7pm to 7am, please call on call as listed on AMION.

## 2019-08-27 NOTE — Evaluation (Signed)
Speech Language Pathology Evaluation Patient Details Name: [name] MRN: [id number] DOB: 1951/06/03 Today's Date: 08/27/2019 Time: 6644-0347 SLP Time Calculation (min) (ACUTE ONLY): 40 min  Problem List:  Patient Active Problem List   Diagnosis Date Noted  . Tracheostomy status ([place])   . Acute respiratory distress syndrome (ARDS) due to COVID-19 virus ([place])   . Chest tube in place   . Primary spontaneous pneumothorax   . Acute respiratory failure ([place])   . Gastric ulcer with hemorrhage 08/10/2019  . Constipation 08/09/2019  . Atrial fibrillation with RVR ([place]) 08/07/2019  . Acute renal failure (ARF) ([place]) 08/06/2019  . GI bleed 08/06/2019  . Pneumothorax on right   . Fluid overload 07/27/2019  . Pneumonia due to COVID-19 virus 07/25/2019  . Acute respiratory failure due to COVID-19 ([place]) 07/25/2019  . AKI (acute kidney injury) ([place]) 07/25/2019  . Dyspepsia 11/26/2014  . BPH (benign prostatic hyperplasia) 10/31/2013  . Achilles rupture 04/05/2011  . S/P Achilles tendon repair 04/05/2011   Past Medical History:  Past Medical History:  Diagnosis Date  . COVID-19   . Heartburn    Past Surgical History:  Past Surgical History:  Procedure Laterality Date  . ACHILLES TENDON SURGERY Right 2012  . COLONOSCOPY  2009   [facility]  . ESOPHAGOGASTRODUODENOSCOPY N/A 12/25/2014   Procedure: ESOPHAGOGASTRODUODENOSCOPY (EGD);  Surgeon: Danie Binder, MD;  Location: [facility];  Service: Endoscopy;  Laterality: N/A;  830am  . ESOPHAGOGASTRODUODENOSCOPY N/A 08/09/2019   Procedure: ESOPHAGOGASTRODUODENOSCOPY (EGD);  Surgeon: Ronald Lobo, MD;  Location: [facility];  Service: Endoscopy;  Laterality: N/A;  Patient is already sedated, so I do not anticipate need for further sedation.  Okay from my standpoint to do either at the bedside or in the operating room  . HEMOSTASIS CLIP PLACEMENT  08/09/2019   Procedure: HEMOSTASIS CLIP PLACEMENT;  Surgeon: Ronald Lobo, MD;  Location: [facility]  ENDOSCOPY;  Service: Endoscopy;;  . HEMOSTASIS CONTROL  08/09/2019   Procedure: HEMOSTASIS CONTROL;  Surgeon: Ronald Lobo, MD;  Location: [facility] ENDOSCOPY;  Service: Endoscopy;;  epi   . KNEE SURGERY Left 1980's  . none    . WRIST SURGERY Left 1970's   HPI:  68 year old with a history of GERD and BPH who presented to [facility], ED with S OB and was found to be hypoxic.  He was initially diagnosed with COVID-19 September 20.  1 to 2 days prior to his presentation he developed worsening shortness of breath with anorexia. Admitted on 10/3, intubated.  EGD on 10/17 showed normal esophagus but excessive gastric fluid. CT scan of the chest on 10/21 showed findings consistent with an empyema as well as persistent pneumothorax.  Bedside bronchoscopy revealed material in the airway. Trached on 10/30. On ATC by 10/31.Additionally MRI on 11/4 showed There is cortical restricted diffusion posteriorly in the right frontal lobe consistent with acute infarct. Additional smaller regions of diffusion abnormality are present in the posterior left frontal lobe, anterior left frontal lobe, and right greater than left medial parietal lobes as well as right cerebellum.    Assessment / Plan / Recommendation Clinical Impression  Pt demonstrates severe cognitive linguistic impairment with a complex combination of deficits. Pt has tracheostomy with no initiation of phonation or speech with max cues. He has a new finding of bilateral CVA with intermittent focused attention to verbal and functional tasks, right gaze devation and apparent difficulty focusing on images and faces even when attempting to attend. He does nod head Y/N intermittently to questions, but  accuracy is very poor to basic Y/N biographical questions. He was able to follow three single commands with RUE. Overall there is strong potential for Aphasia, dysarthria and right hemisphere cognitive impairment as well as visual impairment.  Pt also severely  deconditioned from recovery from COVID 19.  Will continue efforts to address basic communication and attention. Wife present for assessment and was used as a therapeutic agent successfully. Reports pt loves to sing and play golf.     SLP Assessment  SLP Recommendation/Assessment: Patient needs continued Speech Lanaguage Pathology Services SLP Visit Diagnosis: Aphasia (R47.01);Dysarthria and anarthria (R47.1);Aphonia (R49.1);Attention and concentration deficit    Follow Up Recommendations  Inpatient Rehab    Frequency and Duration           SLP Evaluation Cognition  Overall Cognitive Status: Impaired/Different from baseline Arousal/Alertness: Awake/alert Orientation Level: Other (comment)(does not attend to orientation question) Attention: Focused Focused Attention: Impaired Focused Attention Impairment: Verbal basic;Functional basic       Comprehension  Auditory Comprehension Overall Auditory Comprehension: Impaired Yes/No Questions: Impaired Basic Biographical Questions: 26-50% accurate Commands: Impaired One Step Basic Commands: 50-74% accurate Conversation: Simple Interfering Components: Attention;Visual impairments    Expression Verbal Expression Overall Verbal Expression: Impaired Initiation: Impaired   Oral / Motor  Oral Motor/Sensory Function Overall Oral Motor/Sensory Function: Severe impairment(good strength with move involuntary movment) Motor Speech Overall Motor Speech: Impaired Articulation: (does not initaite articualtion)   GO                   Albert Baltimore, MA CCC-SLP  Acute Rehabilitation Services Pager [phone] Office [phone]  [name] 08/27/2019, 2:20 PM

## 2019-08-27 NOTE — Progress Notes (Signed)
EEG complete - results pending 

## 2019-08-27 NOTE — Progress Notes (Signed)
18 Days Post-Op Procedure(s) (LRB): ESOPHAGOGASTRODUODENOSCOPY (EGD) (N/A) HEMOSTASIS CONTROL HEMOSTASIS CLIP PLACEMENT Subjective: More alert this afternoon. Has spoken a few words to [name]  Objective: Vital signs in last 24 hours: Temp:  [97.9 F (36.6 C)-98.8 F (37.1 C)] 98.1 F (36.7 C) (11/04 1129) Pulse Rate:  [73-104] 92 (11/04 1400) Cardiac Rhythm: Sinus tachycardia (11/04 1200) Resp:  [10-27] 22 (11/04 1400) BP: (94-134)/(52-83) 115/73 (11/04 1400) SpO2:  [96 %-100 %] 99 % (11/04 1400)  Hemodynamic parameters for last 24 hours:    Intake/Output from previous day: 11/03 0701 - 11/04 0700 In: 2169.9 [I.V.:589.9; NG/GT:1530; IV Piggyback:50] Out: 619 [Stool:250] Intake/Output this shift: Total I/O In: 400 [NG/GT:400] Out: -   General appearance: responsive Lungs: rhonchi bilaterally intermittent air leak  Lab Results: Recent Labs    08/26/19 0724 08/27/19 0336  WBC 9.8 8.3  HGB 8.3* 7.8*  HCT 27.2* 25.0*  PLT 295 272   BMET:  Recent Labs    08/26/19 0724 08/27/19 0336  NA 140 139  K 6.2* 4.8  CL 105 101  CO2 19* 21*  GLUCOSE 139* 117*  BUN 145* 91*  CREATININE 6.43* 4.44*  CALCIUM 9.6 9.3    PT/INR: No results for input(s): LABPROT, INR in the last 72 hours. 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 She understands this is off label use with no guarantee of success. She understands the risks involved and the potential benefits. She will talk the issues over with her children. Could possibly place valves on Friday if they wish to go ahead. That is if Neuro thinks it is safe to have general anesthesia in this setting. We could wait and place next week if that is better. Keep CT to suction for now   LOS: 33 days    Melrose Nakayama 08/27/2019

## 2019-08-27 NOTE — Procedures (Signed)
Patient Name: [name]  MRN: [id number]  Epilepsy Attending: Lora Havens  Referring Physician/Provider: Etta Quill, PA Date: 08/27/2019 Duration: 24.05 mins  Patient history: 68 year old male with altered mental status.  EEG to evaluate for seizures.  Level of alertness: awake  AEDs during EEG study: None  Technical aspects: This EEG study was done with scalp electrodes positioned according to the 10-20 International system of electrode placement. Electrical activity was acquired at a sampling rate of 500Hz  and reviewed with a high frequency filter of 70Hz  and a low frequency filter of 1Hz . EEG data were recorded continuously and digitally stored.   DESCRIPTION: During awake state, no clear posterior dominant rhythm was seen.  EEG showed continuous generalized polymorphic 2 to 5 Hz theta-delta slowing.  Hyperventilation and photic stimulation were not performed.  Abnormality -Continuous slow, generalized  IMPRESSION: This study is suggestive of moderate diffuse encephalopathy, nonspecific to etiology.  No seizures or epileptiform discharges were seen throughout the recording.  [name]

## 2019-08-27 NOTE — Progress Notes (Addendum)
Nutrition Follow-up RD working remotely.  DOCUMENTATION CODES:   Not applicable  INTERVENTION:    Vital 1.5 at 55 ml/h (1320 ml per day)   Pro-stat 60 ml BID   Provides 2380 kcal, 149 gm protein, 1008 ml free water daily.  NUTRITION DIAGNOSIS:   Inadequate oral intake related to inability to eat(pt sedated and ventilated) as evidenced by NPO status.  Ongoing   GOAL:   Patient will meet greater than or equal to 90% of their needs  Met with TF  MONITOR:   Labs, TF tolerance, Skin, I & O's  ASSESSMENT:   68 y/o male admitted with PNA and COVID 19  10/28 Cortrak placed, tip in the stomach 10/30 S/P tracheostomy 11/1 & 11/2 COVID negative  11/2 Transferred to [facility] for iHD 11/3 started on iHD; MRI brain showed small infarcts involving b/l cerebral cortex and right cerebellum, and large b/l mastoid effusions.  Patient has a persistent leak from his chest tube. Cardiothoracic surgery is following. Discussing treatment options with family.  Currently on trach collar.   May need to resume CRRT if BP continues to be low.   Receiving Vital 1.5 at 60 ml/h via Cortrak tube with Pro-stat 60 ml TID. Tolerating well to meet nutrition needs.  Labs reviewed. BUN 91, creatinine 4.44 CBG's: [phone]  Medications reviewed and include novolog.  Diet Order:   Diet Order            Diet NPO time specified  Diet effective midnight              EDUCATION NEEDS:   No education needs have been identified at this time  Skin:  Skin Assessment: Skin Integrity Issues: Skin Integrity Issues:: DTI, Other (Comment) DTI: buttocks Stage I: lip Other: wound to penis  Last BM:  11/3 (rectal tube)  Height:   Ht Readings from Last 1 Encounters:  08/22/19 '5\' 10"'$  (1.778 m)    Weight:   Wt Readings from Last 1 Encounters:  08/26/19 79.8 kg    Ideal Body Weight:  75.45 kg  BMI:  Body mass index is 25.24 kg/m.  Estimated Nutritional Needs:   Kcal:   2200-2400  Protein:  130-150 gm  Fluid:  >1.9L/day    Molli Barrows, RD, LDN, [place] Pager [phone] After Hours Pager [phone]

## 2019-08-27 NOTE — Progress Notes (Signed)
PROGRESS NOTE    [name]  OAC:[id number] DOB: 1951/01/21 DOA: 07/25/2019 PCP: Kathyrn Drown, MD    Brief Narrative:  68 year old with a history of GERD and BPH who presented to [facility], ED with S OB and was found to be hypoxic. He was initially diagnosed with COVID-19 September 20. 1 to 2 days prior to his presentation he developed worsening shortness of breath with anorexia. His wife's physician during a teleconference call noted that the patient himself is very tachypneic and ultimately EMS was sent to the house. EMS found the patient to have saturations in the 40s. In the ED on nonrebreather the patient sats only improved to the 80s. The patient was severely confused and required emergent intubation. Chest x-ray confirmed multifocal infiltrates. CT chest was negative for PE  Assessment & Plan:   Principal Problem:   Pneumonia due to COVID-19 virus Active Problems:   BPH (benign prostatic hyperplasia)   Acute respiratory failure due to COVID-19 (HCC)   AKI (acute kidney injury) (HCC)   Fluid overload   Pneumothorax on right   Acute renal failure (ARF) (HCC)   GI bleed   Atrial fibrillation with RVR (HCC)   Constipation   Gastric ulcer with hemorrhage   Acute respiratory failure (HCC)   Chest tube in place   Primary spontaneous pneumothorax   Acute respiratory distress syndrome (ARDS) due to COVID-19 virus ([place])   Tracheostomy status (HCC)   Acute Hypoxic Resp. Failure/Pneumonia due to COVID-19 -2 negative COVID-19 test, last on 08/25/2019 -Pt remains afebrile without leukocytosis -Patient underwent tracheostomy on 10/30, somewhat still vent dependent Intermittent trach collar -Completed course of remdesivir and steroids -Patient also received Actemra and convalescent plasma -Continue pulmonary toilet/trach care per protocol -PCCM on board for assistance with vent management  Recent Labs       Lab Results  Component Value Date   SARSCOV2NAA  NEGATIVE 08/25/2019   [place] NEGATIVE 08/24/2019   [place] Detected (A) 07/22/2019     COVID-19 specific Treatment: Decadron 10/2 > 10/12 Remdesivir 10/2 > 10/6 Actemra 10/2 Convalescent plasma 10/3  Encephalopathy/generalized weakness/deconditioned, acute CVA -MRI performed and reviewed, findings of bilateral infarcts -Have consulted Neurology for assistance -Recommendation for ASA, EEG, permissive HTN. 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 -CT scan of the chest on 10/21 showed findings consistent with an empyema as well as persistent pneumothorax   -TPA infused via chest tube on 10/23 -Endobronchial blocker removed on 10/25 -Continue chest tube management per pulmonology  MSSA bacteremia/septic shock -Currently afebrile, with no leukocytosis -2 out of 2 blood cultures positive on 10/9, repeat BC have been negative, last on 08/20/19 NGTD -Echo done on 08/11/2019 was negative -Concern remains for recurrent bacteremia and possible endocarditis, ID recommended Cefazolin to be continued for 24 days from 10/31 -Seems stable  Pseudomonas and Acinetobacter in BAL specimen -Patient was started on meropenem on 10/23 for 10-day treatment -Completed on 08/25/19  AKI on CKD stage 3 -Remains anuric, HD dependent -Status post CRRT, last done on 11/1 -Initiated HD on 08/26/2019 -Nephrology on board  Acute GI bleed secondary to gastric ulcer -S/p EGD with clipping of vessel and large ulcer -H. pylori antibody was noted to be elevated -[facility] had been following -Continue PPI -Patient will need 2 weeks treatment for H. pylori when he is ready for discharge -Follow-up  with [facility] gastroenterology when discharged  Acute blood loss anemia -Likely secondary to above  -Hemoglobin stable at new baseline -No signs of overt bleeding -Patient has been transfused with 5 units of PRBC so far during this hospitalization Last transfused with 2 units on 10/28 -Now on ASA per above  Elevated D-dimer -No evidence for DVT on Dopplers on 10/8 -Thought to be related to suspected empyema in the right lung -Stable at this time  New onset paroxysmal atrial fibrillation with RVR -Heart rate controlled, currently sinus rhythm -Atrial fibrillation likely occurred in the setting of acute illness -Unable to fully anticoagulate due to GI bleeding -See above, acute stroke identified. Have consulted Neurology. Will follow up Neurology recs regarding anticoagulation  Pressure injuries Pressure Injury 08/16/19 Lip Medial;Lower Stage I -  Intact skin with non-blanchable redness of a localized area usually over a bony prominence. healing  (Active)  08/16/19 2000  Location: Lip  Location Orientation: Medial;Lower  Staging: Stage I -  Intact skin with non-blanchable redness of a localized area usually over a bony prominence.  Wound Description (Comments): healing   Present on Admission: No     Pressure Injury 08/16/19 Lip Lower;Mid Stage I -  Intact skin with non-blanchable redness of a localized area usually over a bony prominence. healing  (Active)  08/16/19 2342  Location: Lip  Location Orientation: Lower;Mid  Staging: Stage I -  Intact skin with non-blanchable redness of a localized area usually over a bony prominence.  Wound Description (Comments): healing   Present on Admission:      Pressure Injury 08/24/19 Buttocks Right;Left;Mid Deep Tissue Injury - Purple or maroon localized area of discolored intact skin or blood-filled blister due to damage of underlying soft tissue from pressure and/or shear. (Active)  08/24/19 0800  Location: Buttocks  Location Orientation: Right;Left;Mid  Staging: Deep Tissue Injury - Purple or maroon localized area of discolored intact  skin or blood-filled blister due to damage of underlying soft tissue from pressure and/or shear.  Wound Description (Comments):   Present on Admission: No    DVT prophylaxis: heparin subQ Code Status: DNR Family Communication: Pt in room, wife at bedside Disposition Plan: Uncertain at this time  Consultants:  Pulmonology.  Nephrology.  Gastroenterology., Neurology  Procedures:     Antimicrobials: Anti-infectives (From admission, onward)   Start     Dose/Rate Route Frequency Ordered Stop   08/26/19 2200  ceFAZolin (ANCEF) IVPB 1 g/50 mL premix     1 g 100 mL/hr over 30 Minutes Intravenous Every 24 hours 08/24/19 1021 09/15/19 2359   08/24/19 1100  meropenem (MERREM) 1 g in sodium chloride 0.9 % 100 mL IVPB     1 g 200 mL/hr over 30 Minutes Intravenous Every 24 hours 08/24/19 1021 08/25/19 1156   08/23/19 1100  ceFAZolin (ANCEF) IVPB 2g/100 mL premix  Status:  Discontinued     2 g 200 mL/hr over 30 Minutes Intravenous Every 12 hours 08/23/19 1048 08/24/19 1021   08/21/19 1700  vancomycin (VANCOCIN) IVPB 750 mg/150 ml premix  Status:  Discontinued     750 mg 150 mL/hr over 60 Minutes Intravenous Every 24 hours 08/21/19 0843 08/23/19 1048   08/21/19 1400  vancomycin (VANCOCIN) IVPB 750 mg/150 ml premix  Status:  Discontinued     750 mg 150 mL/hr over 60 Minutes Intravenous Every 24 hours 08/20/19 1146 08/20/19 1218   08/21/19 1000  meropenem (MERREM) 1 g in sodium chloride 0.9 % 100 mL IVPB  Status:  Discontinued  1 g 200 mL/hr over 30 Minutes Intravenous Every 12 hours 08/20/19 1535 08/23/19 1048   08/21/19 0413  meropenem (MERREM) 500 mg in sodium chloride 0.9 % 100 mL IVPB  Status:  Discontinued     500 mg 200 mL/hr over 30 Minutes Intravenous Every 24 hours 08/20/19 1245 08/20/19 1535   08/20/19 1330  vancomycin (VANCOCIN) 1,750 mg in sodium chloride 0.9 % 500 mL IVPB     1,750 mg 250 mL/hr over 120 Minutes Intravenous  Once 08/20/19 1222 08/20/19 1619   08/20/19 1300   vancomycin (VANCOCIN) 1,250 mg in sodium chloride 0.9 % 250 mL IVPB  Status:  Discontinued     1,250 mg 166.7 mL/hr over 90 Minutes Intravenous  Once 08/20/19 1146 08/20/19 1218   08/20/19 1224  vancomycin variable dose per unstable renal function (pharmacist dosing)  Status:  Discontinued      Does not apply See admin instructions 08/20/19 1225 08/21/19 1152   08/15/19 1600  meropenem (MERREM) 1 g in sodium chloride 0.9 % 100 mL IVPB  Status:  Discontinued     1 g 200 mL/hr over 30 Minutes Intravenous Every 12 hours 08/15/19 1543 08/20/19 1245   08/15/19 1000  voriconazole (VFEND) tablet 200 mg  Status:  Discontinued     200 mg Per Tube Every 12 hours 08/14/19 1148 08/15/19 1134   08/14/19 1600  anidulafungin (ERAXIS) 100 mg in sodium chloride 0.9 % 100 mL IVPB  Status:  Discontinued     100 mg 78 mL/hr over 100 Minutes Intravenous Every 24 hours 08/13/19 1645 08/14/19 1148   08/14/19 1300  voriconazole (VFEND) tablet 400 mg     400 mg Per Tube Every 12 hours 08/14/19 1148 08/14/19 2217   08/13/19 1700  anidulafungin (ERAXIS) 200 mg in sodium chloride 0.9 % 200 mL IVPB     200 mg 78 mL/hr over 200 Minutes Intravenous  Once 08/13/19 1645 08/13/19 2030   08/13/19 0200  ceFAZolin (ANCEF) IVPB 2g/100 mL premix  Status:  Discontinued     2 g 200 mL/hr over 30 Minutes Intravenous Every 12 hours 08/11/19 1512 08/15/19 1543   08/12/19 0200  ceFEPIme (MAXIPIME) 2 g in sodium chloride 0.9 % 100 mL IVPB     2 g 200 mL/hr over 30 Minutes Intravenous Every 12 hours 08/11/19 1509 08/12/19 1341   08/07/19 2200  ceFEPIme (MAXIPIME) 2 g in sodium chloride 0.9 % 100 mL IVPB  Status:  Discontinued     2 g 200 mL/hr over 30 Minutes Intravenous Every 12 hours 08/07/19 1540 08/11/19 1509   08/07/19 1000  anidulafungin (ERAXIS) 100 mg in sodium chloride 0.9 % 100 mL IVPB  Status:  Discontinued     100 mg 78 mL/hr over 100 Minutes Intravenous Every 24 hours 08/06/19 0934 08/08/19 1203   08/07/19 0800   ceFEPIme (MAXIPIME) 2 g in sodium chloride 0.9 % 100 mL IVPB  Status:  Discontinued     2 g 200 mL/hr over 30 Minutes Intravenous Every 24 hours 08/06/19 1036 08/07/19 1540   08/06/19 1000  anidulafungin (ERAXIS) 200 mg in sodium chloride 0.9 % 200 mL IVPB    Note to Pharmacy: please   200 mg 78 mL/hr over 200 Minutes Intravenous Every 24 hours 08/06/19 0925 08/06/19 1600   08/06/19 0800  ceFEPIme (MAXIPIME) 2 g in sodium chloride 0.9 % 100 mL IVPB  Status:  Discontinued     2 g 200 mL/hr over 30 Minutes Intravenous Every 12 hours  08/06/19 0733 08/06/19 1036   08/02/19 2200  ceFAZolin (ANCEF) IVPB 2g/100 mL premix  Status:  Discontinued     2 g 200 mL/hr over 30 Minutes Intravenous Every 8 hours 08/02/19 1338 08/06/19 0733   08/02/19 1400  vancomycin (VANCOCIN) 1,250 mg in sodium chloride 0.9 % 250 mL IVPB  Status:  Discontinued     1,250 mg 166.7 mL/hr over 90 Minutes Intravenous Every 24 hours 08/01/19 1320 08/02/19 1332   08/01/19 1330  vancomycin (VANCOCIN) 2,000 mg in sodium chloride 0.9 % 500 mL IVPB     2,000 mg 250 mL/hr over 120 Minutes Intravenous  Once 08/01/19 1158 08/01/19 1636   08/01/19 1300  ceFEPIme (MAXIPIME) 2 g in sodium chloride 0.9 % 100 mL IVPB  Status:  Discontinued     2 g 200 mL/hr over 30 Minutes Intravenous Every 12 hours 08/01/19 1158 08/02/19 1332   07/26/19 1600  remdesivir 100 mg in sodium chloride 0.9 % 250 mL IVPB     100 mg 500 mL/hr over 30 Minutes Intravenous Every 24 hours 07/26/19 0132 07/29/19 1823   07/26/19 0215  remdesivir 200 mg in sodium chloride 0.9 % 250 mL IVPB     200 mg 500 mL/hr over 30 Minutes Intravenous Once 07/26/19 0132 07/26/19 0520       Subjective: Unable to assess given mentation  Objective: Vitals:   08/27/19 1300 08/27/19 1400 08/27/19 1500 08/27/19 1533  BP: 114/74 115/73 117/70   Pulse: (!) 102 92 89   Resp: (!) 25 (!) 22 (!) 23   Temp:    97.9 F (36.6 C)  TempSrc:    Axillary  SpO2: 98% 99% 100%   Weight:       Height:        Intake/Output Summary (Last 24 hours) at 08/27/2019 1605 Last data filed at 08/27/2019 1500 Gross per 24 hour  Intake 1551.8 ml  Output 250 ml  Net 1301.8 ml   Filed Weights   08/23/19 0441 08/24/19 0500 08/26/19 0700  Weight: 77.5 kg 72.9 kg 79.8 kg    Examination:  General exam: Appears calm and comfortable  Respiratory system: No audible wheezing. Respiratory effort normal. Cardiovascular system: regular, s1, s2 Gastrointestinal system: soft, nondistended Central nervous system: Alert and oriented. No focal neurological deficits. Extremities: Symmetric 5 x 5 power. Skin: No rashes, lesions Psychiatry: Judgement and insight appear normal. Mood & affect appropriate.   Data Reviewed: I have personally reviewed following labs and imaging studies  CBC: Recent Labs  Lab 08/22/19 0415 08/23/19 0415 08/23/19 0453 08/24/19 0515 08/25/19 0517 08/26/19 0724 08/27/19 0336  WBC 8.5 7.8  --  9.2 10.2 9.8 8.3  NEUTROABS 6.3 5.7  --  6.9 8.1*  --  6.1  HGB 8.3* 8.3* 8.2* 8.7* 8.6* 8.3* 7.8*  HCT 27.0* 27.1* 24.0* 28.6* 28.0* 27.2* 25.0*  MCV 97.5 96.4  --  95.3 96.2 96.5 94.3  PLT 175 179  --  194 246 295 601   Basic Metabolic Panel: Recent Labs  Lab 08/21/19 0500  08/22/19 0415  08/23/19 0415  08/23/19 1550 08/24/19 0515 08/25/19 0517 08/26/19 0724 08/27/19 0336  NA 138   < > 137   < > 136   < > 136 137 138 140 139  K 4.7   < > 5.0   < > 4.6   < > 4.5 4.4 4.9 6.2* 4.8  CL 102   < > 103   < > 102  --  104  103 103 105 101  CO2 28   < > 28   < > 26  --  _0 19* 21*  GLUCOSE 147*   < > 140*   < > 124*  --  103* 117* 123* 139* 117*  BUN 74*   < > 48*   < > 45*  --  41* 38* 100* 145* 91*  CREATININE 3.00*   < > 2.37*   < > 2.01*  --  2.02* 1.60* 4.02* 6.43* 4.44*  CALCIUM 8.6*   < > 8.4*   < > 8.5*  --  8.5* 8.7* 9.5 9.6 9.3  MG 2.4  --  2.5*  --  2.5*  --   --  2.6* 2.9*  --   --   PHOS 5.0*   < > 3.9   < > 3.5  --  3.3 2.9 5.7* 9.5*  --    < > =  values in this interval not displayed.   GFR: Estimated Creatinine Clearance: 16.9 mL/min (A) (by C-G formula based on SCr of 4.44 mg/dL (H)). Liver Function Tests: Recent Labs  Lab 08/22/19 1750 08/23/19 0415 08/23/19 1550 08/24/19 0515 08/26/19 0724  ALBUMIN 2.1* 2.3* 2.4* 2.5* 2.1*   No results for input(s): LIPASE, AMYLASE in the last 168 hours. No results for input(s): AMMONIA in the last 168 hours. Coagulation Profile: Recent Labs  Lab 08/22/19 1121  INR 1.0   Cardiac Enzymes: No results for input(s): CKTOTAL, CKMB, CKMBINDEX, TROPONINI in the last 168 hours. BNP (last 3 results) No results for input(s): PROBNP in the last 8760 hours. HbA1C: No results for input(s): HGBA1C in the last 72 hours. CBG: Recent Labs  Lab 08/27/19 0033 08/27/19 0423 08/27/19 0715 08/27/19 1130 08/27/19 1537  GLUCAP 128* 123* 110* 137* 113*   Lipid Profile: No results for input(s): CHOL, HDL, LDLCALC, TRIG, CHOLHDL, LDLDIRECT in the last 72 hours. Thyroid Function Tests: No results for input(s): TSH, T4TOTAL, FREET4, T3FREE, THYROIDAB in the last 72 hours. Anemia Panel: No results for input(s): VITAMINB12, FOLATE, FERRITIN, TIBC, IRON, RETICCTPCT in the last 72 hours. Sepsis Labs: Recent Labs  Lab 08/21/19 0500 08/22/19 0415 08/25/19 0517  PROCALCITON 5.15 4.38 5.82    Recent Results (from the past 240 hour(s))  Culture, blood (Routine X 2) w Reflex to ID Panel     Status: None   Collection Time: 08/20/19 12:35 PM   Specimen: BLOOD  Result Value Ref Range Status   Specimen Description   Final    BLOOD LEFT HAND Performed at [facility] [address] [place], [place] [address]    Special Requests   Final    BOTTLES DRAWN AEROBIC AND ANAEROBIC Blood Culture adequate volume Performed at [facility] [address] [place], [place] [address]    Culture   Final    NO GROWTH 5 DAYS Performed at [facility], [place]  [address], [place], [place] [address]    Report Status 08/25/2019 FINAL  Final  Culture, blood (Routine X 2) w Reflex to ID Panel     Status: None   Collection Time: 08/20/19 12:48 PM   Specimen: BLOOD  Result Value Ref Range Status   Specimen Description   Final    BLOOD LEFT ARM Performed at [facility] [address], [place], [place] [address]    Special Requests   Final    BOTTLES DRAWN AEROBIC ONLY Blood Culture adequate volume Performed  at [facility], [place] [address] [place], [place] [address]    Culture   Final    NO GROWTH 5 DAYS Performed at [facility], [place] [address] [place], [place] [address]    Report Status 08/25/2019 FINAL  Final  Culture, respiratory     Status: None   Collection Time: 08/20/19 12:54 PM   Specimen: Tracheal Aspirate  Result Value Ref Range Status   Specimen Description   Final    TRACHEAL ASPIRATE Performed at [facility] [address], [place], [place] [address]    Special Requests   Final    NONE Performed at [facility], [place] [address] [place], [place] [address]    Gram Stain   Final    ABUNDANT WBC PRESENT,BOTH PMN AND MONONUCLEAR NO ORGANISMS SEEN Performed at [facility], [place] [address], [place],  [address]    Culture RARE PSEUDOMONAS AERUGINOSA  Final   Report Status 08/23/2019 FINAL  Final   Organism ID, Bacteria PSEUDOMONAS AERUGINOSA  Final      Susceptibility   Pseudomonas aeruginosa - MIC*    CEFTAZIDIME 4 SENSITIVE Sensitive     CIPROFLOXACIN <=0.25 SENSITIVE Sensitive     GENTAMICIN <=1 SENSITIVE Sensitive     IMIPENEM 1 SENSITIVE Sensitive     PIP/TAZO 8 SENSITIVE Sensitive     CEFEPIME 4 SENSITIVE Sensitive     * RARE PSEUDOMONAS AERUGINOSA  SARS CORONAVIRUS 2 (TAT 6-24 HRS) Nasopharyngeal Nasopharyngeal Swab     Status: None   Collection Time: 08/24/19  1:55 PM   Specimen: Nasopharyngeal Swab  Result Value Ref Range  Status   SARS Coronavirus 2 NEGATIVE NEGATIVE Final    Comment: (NOTE) SARS-CoV-2 target nucleic acids are NOT DETECTED. The SARS-CoV-2 RNA is generally detectable in upper and lower respiratory specimens during the acute phase of infection. Negative results do not preclude SARS-CoV-2 infection, do not rule out co-infections with other pathogens, and should not be used as the sole basis for treatment or other patient management decisions. Negative results must be combined with clinical observations, patient history, and epidemiological information. The expected result is Negative. Fact Sheet for Patients: SugarRoll.be Fact Sheet for Healthcare Providers: https://www.woods-mathews.com/ This test is not yet approved or cleared by the [place] FDA and  has been authorized for detection and/or diagnosis of SARS-CoV-2 by FDA under an Emergency Use Authorization (EUA). This EUA will remain  in effect (meaning this test can be used) for the duration of the COVID-19 declaration under Section 56 4(b)(1) of the Act, 21 U.S.C. section 360bbb-3(b)(1), unless the authorization is terminated or revoked sooner. Performed at [facility], [place] [address] [place], [place] [address]   SARS CORONAVIRUS 2 (TAT 6-24 HRS) Nasopharyngeal Nasopharyngeal Swab     Status: None   Collection Time: 08/25/19 11:37 AM   Specimen: Nasopharyngeal Swab  Result Value Ref Range Status   SARS Coronavirus 2 NEGATIVE NEGATIVE Final    Comment: (NOTE) SARS-CoV-2 target nucleic acids are NOT DETECTED. The SARS-CoV-2 RNA is generally detectable in upper and lower respiratory specimens during the acute phase of infection. Negative results do not preclude SARS-CoV-2 infection, do not rule out co-infections with other pathogens, and should not be used as the sole basis for treatment or other patient management decisions. Negative results must be combined with clinical  observations, patient history, and epidemiological information. The expected result is Negative. Fact Sheet for Patients: SugarRoll.be Fact Sheet for  Healthcare Providers: https://www.woods-mathews.com/ This test is not yet approved or cleared by the [place] and  has been authorized for detection and/or diagnosis of SARS-CoV-2 by FDA under an Emergency Use Authorization (EUA). This EUA will remain  in effect (meaning this test can be used) for the duration of the COVID-19 declaration under Section 56 4(b)(1) of the Act, 21 U.S.C. section 360bbb-3(b)(1), unless the authorization is terminated or revoked sooner. Performed at [facility], [place] [address] [place], [place] [address]      Radiology Studies: Mr Brain Wo Contrast  Result Date: 08/26/2019 CLINICAL DATA:  Altered level of consciousness. History of COVID-19. EXAM: MRI HEAD WITHOUT CONTRAST TECHNIQUE: Multiplanar, multiecho pulse sequences of the brain and surrounding structures were obtained without intravenous contrast. COMPARISON:  Head CT 01/05/2014 FINDINGS: Brain: There is cortical restricted diffusion posteriorly in the right frontal lobe consistent with acute infarct. Additional smaller regions of diffusion abnormality are present in the posterior left frontal lobe, anterior left frontal lobe, and right greater than left medial parietal lobes as well as right cerebellum with normal to slightly reduced ADC compatible with acute to early subacute infarcts. There is mild associated T2 hyperintensity/edema associated with the infarcts. A chronic microhemorrhage is noted in the region of the right external capsule. No mass, midline shift, or extra-axial fluid collection is identified. The ventricles and sulci are within normal limits for age. The pituitary gland is borderline prominent in height without a discrete sellar mass identified on this nondedicated study. Vascular: Major  intracranial vascular flow voids are preserved. Skull and upper cervical spine: Unremarkable bone marrow signal. Sinuses/Orbits: Unremarkable orbits. Large bilateral mastoid effusions. Mild left frontal sinus mucosal thickening. Other: None. IMPRESSION: 1. Small acute to early subacute infarcts involving bilateral cerebral cortex and right cerebellum. 2. Large bilateral mastoid effusions. Electronically Signed   By: Logan Bores M.D.   On: 08/26/2019 19:33   Dg Chest Port 1 View  Result Date: 08/26/2019 CLINICAL DATA:  Pneumothorax. EXAM: PORTABLE CHEST 1 VIEW COMPARISON:  08/23/2019. FINDINGS: Tracheostomy tube, left IJ line, feeding tube, right chest tube in stable position. Stable right base pneumothorax. Stable atelectatic changes/infiltrate right upper lung and left base. Heart size stable. IMPRESSION: 1. Lines and tubes including right chest tube in stable position. Stable right base pneumothorax. 2. Stable right upper lung and left lower atelectatic changes/infiltrates. Chest is unchanged from prior exam. Electronically Signed   By: [place]   On: 08/26/2019 10:36    Scheduled Meds: . chlorhexidine  15 mL Mouth/Throat BID  . Chlorhexidine Gluconate Cloth  6 each Topical Daily  . darbepoetin (ARANESP) injection - NON-DIALYSIS  100 mcg Subcutaneous Q Wed-1800  . famotidine  20 mg Per Tube Daily  . feeding supplement (PRO-STAT SUGAR FREE 64)  60 mL Per Tube BID  . Gerhardt's butt cream   Topical TID  . heparin injection (subcutaneous)  7,500 Units Subcutaneous Q8H  . insulin aspart  0-9 Units Subcutaneous Q4H  . mouth rinse  15 mL Mouth Rinse 10 times per day  . sucralfate  1 g Oral Q6H   Continuous Infusions: . sodium chloride Stopped (08/21/19 1920)  . sodium chloride 10 mL/hr at 08/25/19 2118  . sodium chloride    . sodium chloride    .  ceFAZolin (ANCEF) IV Stopped (08/26/19 2250)  . dextrose Stopped (08/16/19 1623)  . feeding supplement (VITAL 1.5 CAL) 1,000 mL (08/27/19  1338)  . phenylephrine (NEO-SYNEPHRINE) Adult infusion Stopped (08/26/19 1602)  LOS: 33 days   Marylu Lund, MD [facility] Pager On Amion  If 7PM-7AM, please contact night-coverage 08/27/2019, 4:05 PM

## 2019-08-27 NOTE — Progress Notes (Signed)
NAME:  [name], MRN:  [id number], DOB:  [birth year], LOS: 10 ADMISSION DATE:  07/25/2019, CONSULTATION DATE:  10/3 REFERRING MD:  Nadara Mustard, CHIEF COMPLAINT:  Dyspnea   Brief History   68 y/o male diagnosed with COVID on 9/29 presented to the [facility] ED on 10/2 with dyspnea, hypoxemia requiring intubation.  Admitted to [facility], developed AKI requiring CRRT, pneumothorax requiring chest tube and had a large bronchopleural fistula treated for 10 days with a bronchial blocker.  Tracheostomy placed on 10/30.   Past Medical History  GERD  Significant Hospital Events   10/03 Admit to [facility] from [facility] ER, start decadron and remdesivir, given tociluzimab; prone positioning 10/04 convalescent plasma 10/05 stop prone positioning 10/06 convalescent plasma; prone positioning again 10/09 start ABx 10/10 MSSA bacteremia, CVL d/ced; ID consulted; increase OG tube outpt 10/11 vent weaning trial started 10/13 fever, pneumothorax, pig tail chest tube placed 10/14 worsening hypotension and renal fx >> consulted nephrology; worsening PTX >> replaced chest tube; GNR in sputum >> ABx changed 10/15 start CRRT; endobronchial blocker placed for persistent air leak 10/16 endobronchial blocker repositioned, changed chest tube to water seal; melana with ABLA >> GI consulted; transfuse PRBC 10/17 persistent air leak, increased WOB; start nimbex gtt >> air leak decreased; EGD; A fib with RVR >> start amiodarone 10/18 transfuse PRBC; GI s/o 10/20 resume heparin gtt 10/21 stopped nimbex, chest tube to suction, stopped heparin drip because Hgb dropped again 10/22 TPA in chest tube, repositioned endobronchial blocker 10/23 deflated endobronchial balloon 10/24 small air leak  10/25 removed bronchial blocker 10/30 tracheostomy  11/1 D/C CRRT 11/2 Transfer to [facility] 11/3 Started on IHD  Consults:  ID Nephrology Gastroenterology PCCM  Procedures:   10/2 R IJ CVL > 10/10 10/13 Rt pig tail chest tube >> 10/14 10/14 R  40F chest tube > 10/14 L IJ HD cath > 10/30 tracheostomy >   Significant Diagnostic Tests:  10/2 CT angiogram chest > extensive bilateral airspace disease predominantly posterior 10/8 doppler legs b/l > no DVT 10/15 renal u/s > normal 10/17 EGD > non bleeding gastric ulcer 10/19 Echo >> EF 60 to 65%, hyperdynamic LV with small LVOT gradient 10/22 CT chest > large collection of air in pleural space with fluid, pneumonia bilaterally R>L endobronchial blocker in place, chest tube anterior 10/31 CXR > persistent small right pneumothorax despite chest in adequate position MRI brain 11/3 > small acute to subacute infarcts involving bilateral cerebral cortex and right cerebellum.  Large b/l mastoid effusions  Micro Data:  9/20 SARS COV 2 > POSITIVE 10/2 blood > negative 10/9 blood > MSSA 2/4 10/9 resp > MSSA, Pseudomonas 10/13 blood >> negative 10/21 bronch wash bacterial > pseduomonas, acinetobacter baumannii 10/21 bronch wash fungal >  10/21 bronch wash aspergillus antigen> negative 10/24 bronchoscopy wash> pseudomonas 10/28 Blood cx>>> 10/28 Sputum cx>>> Pseudomonas 11/1 SARS COV 2 > negative  Antimicrobials:  10/3 remdesivir > 10/6 10/3 actemra 10/3 decadron > 10/12 10/4 convalescent plasma 10/6 convalescent plasma  10/9 vancomycin > 10/10 10/9 cefepime > 10/10 10/10 ancef > 10/13 10/13 cefepime >10/20 10/14 anidulofungin > 10/15  10/20 ancef > 10/23 10/23 meropenem > >>>  10/21 anidulofungin> 10/22 10/22 voriconazole > 10/23 Merrem 10/28>>>11/2 Vanc 10/28>>>10/30 Ancef 10/31>> 11/3  Interim history/subjective:  MRI revealed small acute to subacute infarcts. No additional acute events.  Objective   Blood pressure 103/66, pulse 96, temperature 98.2 F (36.8 C), temperature source Axillary, resp. rate (!) 21, height 5\' 10"  (1.778 m), weight 79.8  kg, SpO2 99 %.        Intake/Output Summary (Last 24 hours) at 08/27/2019 0851 Last data filed at 08/27/2019 0800  Gross per 24 hour  Intake 2169.88 ml  Output 619 ml  Net 1550.88 ml   Filed Weights   08/23/19 0441 08/24/19 0500 08/26/19 0700  Weight: 77.5 kg 72.9 kg 79.8 kg    Examination: General: Adult male, resting in bed, in NAD. Neuro: Eyes open but does not follow commands. HEENT: [place]/AT. Sclerae anicteric. Trach C/D/I. Cardiovascular: RRR, no M/R/G.  Lungs: Respirations even and unlabored.  CTAB nd unlabored.  CTA bilaterally, No W/R/R. Abdomen: BS x 4, soft, NT/ND.  Musculoskeletal: No gross deformities, no edema.  Skin: Intact, warm, no rashes.  Assessment & Plan:   Acute respiratory failure with hypoxemia - 2/2 COVID-19 (s/p remdsevir, actemra, convalescent plasma, steroids).  Trach dependence - trach placed 10/30 Continue ATC as tolerated - keeping patient off ventilator will be key in helping air leak heal.  Trach care per routine. Continue pulmonary toilette per protocol.  R pneumothorax - s/p chest tube placement.  Now with persistent airleak / BPF despite endobronchial blocker placement (removed 10/25 due to MSSA bacteremia).  Continue chest tube to suction. CVTS following, planning to discuss with family.  AKI: no evidence of recovery iHD per nephrology. Will need Permacath placed.  Neuromuscular weakness/encephalopathy PT consult, range of motion exercises. Minimize sedation.   MSSA bacteremia - Echocardiogram negative.  Complete cefazolin x 24 days per ID (started 10/31).  Bilateral cerebral cortex and right cerebellum small acute to subacute infarcts. Per primary team / neuro.   Rest per primary team.   Best practice:  Diet: tube feeding Pain/Anxiety/Delirium protocol (if indicated): fentanyl for pain. VAP protocol (if indicated): yes DVT prophylaxis: sub cutaneous heparin GI prophylaxis: Pantoprazole for stress ulcer prophylaxis Glucose control: SSI Mobility: PT  Code Status: DNR if arrests Family Communication: Aneta Mins for an update on 11/2,  she voiced understanding.  Will call 11/4. Disposition: ICU transfer to progressive if remains off presors   Montey Hora, [place] 08/27/2019, 9:14 AM

## 2019-08-28 DIAGNOSIS — I4891 Unspecified atrial fibrillation: Secondary | ICD-10-CM | POA: Diagnosis not present

## 2019-08-28 DIAGNOSIS — U071 COVID-19: Secondary | ICD-10-CM | POA: Diagnosis not present

## 2019-08-28 DIAGNOSIS — N401 Enlarged prostate with lower urinary tract symptoms: Secondary | ICD-10-CM | POA: Diagnosis not present

## 2019-08-28 DIAGNOSIS — E872 Acidosis: Secondary | ICD-10-CM | POA: Diagnosis not present

## 2019-08-28 DIAGNOSIS — I6330 Cerebral infarction due to thrombosis of unspecified cerebral artery: Secondary | ICD-10-CM | POA: Diagnosis not present

## 2019-08-28 DIAGNOSIS — A4901 Methicillin susceptible Staphylococcus aureus infection, unspecified site: Secondary | ICD-10-CM | POA: Diagnosis not present

## 2019-08-28 DIAGNOSIS — J9311 Primary spontaneous pneumothorax: Secondary | ICD-10-CM | POA: Diagnosis not present

## 2019-08-28 DIAGNOSIS — D649 Anemia, unspecified: Secondary | ICD-10-CM | POA: Diagnosis not present

## 2019-08-28 DIAGNOSIS — N179 Acute kidney failure, unspecified: Secondary | ICD-10-CM | POA: Diagnosis not present

## 2019-08-28 DIAGNOSIS — R579 Shock, unspecified: Secondary | ICD-10-CM | POA: Diagnosis not present

## 2019-08-28 DIAGNOSIS — J9601 Acute respiratory failure with hypoxia: Secondary | ICD-10-CM | POA: Diagnosis not present

## 2019-08-28 DIAGNOSIS — I959 Hypotension, unspecified: Secondary | ICD-10-CM | POA: Diagnosis not present

## 2019-08-28 DIAGNOSIS — G931 Anoxic brain damage, not elsewhere classified: Secondary | ICD-10-CM

## 2019-08-28 DIAGNOSIS — K250 Acute gastric ulcer with hemorrhage: Secondary | ICD-10-CM | POA: Diagnosis not present

## 2019-08-28 DIAGNOSIS — J1289 Other viral pneumonia: Secondary | ICD-10-CM | POA: Diagnosis not present

## 2019-08-28 LAB — GLUCOSE, CAPILLARY
Glucose-Capillary: 105 mg/dL — ABNORMAL HIGH (ref 70–99)
Glucose-Capillary: 110 mg/dL — ABNORMAL HIGH (ref 70–99)
Glucose-Capillary: 121 mg/dL — ABNORMAL HIGH (ref 70–99)
Glucose-Capillary: 132 mg/dL — ABNORMAL HIGH (ref 70–99)
Glucose-Capillary: 134 mg/dL — ABNORMAL HIGH (ref 70–99)
Glucose-Capillary: 157 mg/dL — ABNORMAL HIGH (ref 70–99)

## 2019-08-28 LAB — CBC WITH DIFFERENTIAL/PLATELET
Abs Immature Granulocytes: 0.27 10*3/uL — ABNORMAL HIGH (ref 0.00–0.07)
Basophils Absolute: 0 10*3/uL (ref 0.0–0.1)
Basophils Relative: 0 %
Eosinophils Absolute: 0.4 10*3/uL (ref 0.0–0.5)
Eosinophils Relative: 5 %
HCT: 25.4 % — ABNORMAL LOW (ref 39.0–52.0)
Hemoglobin: 7.9 g/dL — ABNORMAL LOW (ref 13.0–17.0)
Immature Granulocytes: 4 %
Lymphocytes Relative: 12 %
Lymphs Abs: 0.9 10*3/uL (ref 0.7–4.0)
MCH: 29 pg (ref 26.0–34.0)
MCHC: 31.1 g/dL (ref 30.0–36.0)
MCV: 93.4 fL (ref 80.0–100.0)
Monocytes Absolute: 0.4 10*3/uL (ref 0.1–1.0)
Monocytes Relative: 5 %
Neutro Abs: 5.7 10*3/uL (ref 1.7–7.7)
Neutrophils Relative %: 74 %
Platelets: 310 10*3/uL (ref 150–400)
RBC: 2.72 MIL/uL — ABNORMAL LOW (ref 4.22–5.81)
RDW: 16.7 % — ABNORMAL HIGH (ref 11.5–15.5)
WBC: 7.7 10*3/uL (ref 4.0–10.5)
nRBC: 1.6 % — ABNORMAL HIGH (ref 0.0–0.2)

## 2019-08-28 LAB — BASIC METABOLIC PANEL
Anion gap: 19 — ABNORMAL HIGH (ref 5–15)
BUN: 146 mg/dL — ABNORMAL HIGH (ref 8–23)
CO2: 19 mmol/L — ABNORMAL LOW (ref 22–32)
Calcium: 9.4 mg/dL (ref 8.9–10.3)
Chloride: 101 mmol/L (ref 98–111)
Creatinine, Ser: 6.61 mg/dL — ABNORMAL HIGH (ref 0.61–1.24)
GFR calc Af Amer: 9 mL/min — ABNORMAL LOW (ref >60)
GFR calc non Af Amer: 8 mL/min — ABNORMAL LOW (ref >60)
Glucose, Bld: 124 mg/dL — ABNORMAL HIGH (ref 70–99)
Potassium: 6.2 mmol/L — ABNORMAL HIGH (ref 3.5–5.1)
Sodium: 139 mmol/L (ref 135–145)

## 2019-08-28 MED ORDER — ATORVASTATIN CALCIUM 40 MG PO TABS
40.0000 mg | ORAL_TABLET | Freq: Every day | ORAL | Status: DC
  Administered 2019-08-28 – 2019-10-02 (×36): 40 mg via ORAL
  Filled 2019-08-28 (×36): qty 1

## 2019-08-28 MED ORDER — SODIUM ZIRCONIUM CYCLOSILICATE 10 G PO PACK
10.0000 g | PACK | Freq: Every day | ORAL | Status: DC
  Administered 2019-08-29 – 2019-09-05 (×8): 10 g via ORAL
  Filled 2019-08-28 (×9): qty 1

## 2019-08-28 NOTE — Progress Notes (Signed)
19 Days Post-Op Procedure(s) (LRB): ESOPHAGOGASTRODUODENOSCOPY (EGD) (N/A) HEMOSTASIS CONTROL HEMOSTASIS CLIP PLACEMENT Subjective: Asleep currently No respiratory distress  Objective: Vital signs in last 24 hours: Temp:  [97.9 F (36.6 C)-98.4 F (36.9 C)] 98.4 F (36.9 C) (11/05 1245) Pulse Rate:  [84-110] 85 (11/05 1618) Cardiac Rhythm: Normal sinus rhythm (11/05 1600) Resp:  [17-29] 20 (11/05 1618) BP: (73-125)/(52-74) 106/67 (11/05 1618) SpO2:  [97 %-100 %] 100 % (11/05 1600) Weight:  [79.7 kg-80.7 kg] 79.7 kg (11/05 1625)  Hemodynamic parameters for last 24 hours:    Intake/Output from previous day: 11/04 0701 - 11/05 0700 In: 1330 [NG/GT:1245; IV Piggyback:50] Out: 670 [Stool:650; Chest Tube:20] Intake/Output this shift: Total I/O In: -  Out: 1000 [Other:1000]  Moderate air leak  Lab Results: Recent Labs    08/27/19 0336 08/28/19 0409  WBC 8.3 7.7  HGB 7.8* 7.9*  HCT 25.0* 25.4*  PLT 272 310   BMET:  Recent Labs    08/27/19 0336 08/28/19 0409  NA 139 139  K 4.8 6.2*  CL 101 101  CO2 21* 19*  GLUCOSE 117* 124*  BUN 91* 146*  CREATININE 4.44* 6.61*  CALCIUM 9.3 9.4    PT/INR: No results for input(s): LABPROT, INR in the last 72 hours. ABG    Component Value Date/Time   PHART 7.401 08/23/2019 0453   HCO3 25.7 08/23/2019 0453   TCO2 27 08/23/2019 0453   ACIDBASEDEF 2.0 08/14/2019 0515   O2SAT 93.0 08/23/2019 0453   CBG (last 3)  Recent Labs    08/28/19 0821 08/28/19 1057 08/28/19 1524  GLUCAP 105* 157* 121*    Assessment/Plan: S/P Procedure(s) (LRB): ESOPHAGOGASTRODUODENOSCOPY (EGD) (N/A) HEMOSTASIS CONTROL HEMOSTASIS CLIP PLACEMENT -He is asleep currently so I did not wake him He still has a moderate air leak, which looks like it may be a little better than it was when I forst saw him. I spoke with Dr. Lake Bells earlier today. He is concerned about the possibility of pneumonia with IBV which is certainly a possibility as there is a  tendency for the valves to accumulate secretions. Risk is less than with a blocker which doesn't allow any egress vs the IBV which potentially do.   Will hold off on IBV placement for now unless air leak worsens and continue to follow  LOS: 34 days    Melrose Nakayama 08/28/2019

## 2019-08-28 NOTE — Progress Notes (Signed)
Physical Therapy Treatment Patient Details Name: [name] MRN: [id number] DOB: 31-Mar-1951 Today's Date: 08/28/2019    History of Present Illness 68 y/o male diagnosed with COVID on 9/29 presented to the [facility] ED on 10/2 with dyspnea, hypoxemia requiring intubation.  Admitted to [facility], developed AKI requiring CRRT, pneumothorax requiring chest tube and had a large bronchopleural fistula treated for 10 days with a bronchial blocker.  Tracheostomy placed on 10/30. Tested COVID neg x2 on 11/2. MRI on 11/4 showed There is cortical restricted diffusion posteriorly in the right frontal lobe consistent with acute infarct. Additional smaller regions of diffusion abnormality are present in the posterior left frontal lobe, anterior left frontal lobe, and right greater than left medial parietal lobes as well as right cerebellum.     PT Comments    Pt more alert, with eyes open most of the session. Pt unable to elicit any muscle activation during session. Focus of session was sitting EoB for 8 minutes with total A, providing weightbearing through UE, stretching of pectoral and cervical muscles. Pt requires total Ax2 for bed mobility. D/c plans remain appropriate at this time. PT will continue to follow acutely.   Follow Up Recommendations  SNF     Equipment Recommendations  Other (comment)(TBD at next venue)       Precautions / Restrictions Precautions Precautions: None Restrictions Weight Bearing Restrictions: No    Mobility  Bed Mobility Overal bed mobility: Needs Assistance Bed Mobility: Rolling;Sidelying to Sit;Sit to Sidelying Rolling: Total assist;+2 for safety/equipment;+2 for physical assistance Sidelying to sit: Total assist;+2 for physical assistance;+2 for safety/equipment     Sit to sidelying: +2 for physical assistance;Total assist;+2 for safety/equipment General bed mobility comments: Total A +2 for bed mobility to bring BLEs towards EOB and then elevate trunk into upright  posture  Transfers                 General transfer comment: Defered for safety        Balance Overall balance assessment: Needs assistance Sitting-balance support: Feet supported;Bilateral upper extremity supported;No upper extremity supported Sitting balance-Leahy Scale: Zero Sitting balance - Comments: requires assist for maintaining balance, no righting response at all in any direction                                    Cognition Arousal/Alertness: Lethargic Behavior During Therapy: Flat affect Overall Cognitive Status: Difficult to assess                                 General Comments: Pt with eyes open most of session, still not focusing, pt able to respond with nodding once or twice      Exercises Other Exercises Other Exercises: Focus of session, PROM shoulder retraction, cervical extension and rotation, weight bearing through UE    General Comments General comments (skin integrity, edema, etc.): VSS throughout, wife present and engaging during therapy      Pertinent Vitals/Pain Pain Assessment: Faces Faces Pain Scale: Hurts little more Pain Location: grimacing with bed mobility Pain Descriptors / Indicators: Discomfort;Grimacing Pain Intervention(s): Limited activity within patient's tolerance;Monitored during session;Repositioned           PT Goals (current goals can now be found in the care plan section) Acute Rehab PT Goals Patient Stated Goal: Wife wanting him to return to PLOF PT Goal Formulation: With family  Time For Goal Achievement: 09/09/19 Potential to Achieve Goals: Fair Progress towards PT goals: Progressing toward goals    Frequency    Min 3X/week      PT Plan Current plan remains appropriate       AM-PAC PT "6 Clicks" Mobility   Outcome Measure  Help needed turning from your back to your side while in a flat bed without using bedrails?: Total Help needed moving from lying on your back to  sitting on the side of a flat bed without using bedrails?: Total Help needed moving to and from a bed to a chair (including a wheelchair)?: Total Help needed standing up from a chair using your arms (e.g., wheelchair or bedside chair)?: Total Help needed to walk in hospital room?: Total Help needed climbing 3-5 steps with a railing? : Total 6 Click Score: 6    End of Session Equipment Utilized During Treatment: Oxygen Activity Tolerance: Patient limited by lethargy Patient left: in bed;with call bell/phone within reach;with bed alarm set;with family/visitor present Nurse Communication: Mobility status PT Visit Diagnosis: Unsteadiness on feet (R26.81);Other abnormalities of gait and mobility (R26.89);Muscle weakness (generalized) (M62.81);Difficulty in walking, not elsewhere classified (R26.2)     Time: 1137-1207 PT Time Calculation (min) (ACUTE ONLY): 30 min  Charges:  $Therapeutic Activity: 23-37 mins                     Devynne Sturdivant B. Migdalia Dk PT, DPT Acute Rehabilitation Services Pager [phone] Office [phone]    [place] 08/28/2019, 5:13 PM

## 2019-08-28 NOTE — Progress Notes (Signed)
STROKE TEAM PROGRESS NOTE   INTERVAL HISTORY Pt lying in bed, his RN and wife at bedside. Pt on trach and vent. He is undergoing dialysis. SBP 80s, restarted on neo, SBP up to 92. Pt eyes open, but not following commands, not moving extremities, even on pain stimulation.   Vitals:   08/28/19 0800 08/28/19 0823 08/28/19 0833 08/28/19 1059  BP: 122/74     Pulse: 88     Resp: 19     Temp:  98 F (36.7 C)  98 F (36.7 C)  TempSrc:  Axillary  Axillary  SpO2: 99%  99%   Weight:      Height:        CBC:  Recent Labs  Lab 08/27/19 0336 08/28/19 0409  WBC 8.3 7.7  NEUTROABS 6.1 5.7  HGB 7.8* 7.9*  HCT 25.0* 25.4*  MCV 94.3 93.4  PLT 272 563    Basic Metabolic Panel:  Recent Labs  Lab 08/24/19 0515 08/25/19 0517 08/26/19 0724 08/27/19 0336 08/28/19 0409  NA 137 138 140 139 139  K 4.4 4.9 6.2* 4.8 6.2*  CL 103 103 105 101 101  CO2 26 22 19* 21* 19*  GLUCOSE 117* 123* 139* 117* 124*  BUN 38* 100* 145* 91* 146*  CREATININE 1.60* 4.02* 6.43* 4.44* 6.61*  CALCIUM 8.7* 9.5 9.6 9.3 9.4  MG 2.6* 2.9*  --   --   --   PHOS 2.9 5.7* 9.5*  --   --    Lipid Panel:     Component Value Date/Time   CHOL 211 (H) 11/08/2017 1030   TRIG 99 07/25/2019 1656   HDL 46 11/08/2017 1030   CHOLHDL 4.6 11/08/2017 1030   CHOLHDL 4.5 11/20/2014 1224   VLDL 16 11/20/2014 1224   LDLCALC 149 (H) 11/08/2017 1030   HgbA1c:  Lab Results  Component Value Date   HGBA1C 6.1 (H) 07/26/2019   Urine Drug Screen: No results found for: LABOPIA, COCAINSCRNUR, LABBENZ, AMPHETMU, THCU, LABBARB  Alcohol Level No results found for: [facility]  IMAGING Mr Brain Wo Contrast  Result Date: 08/26/2019 CLINICAL DATA:  Altered level of consciousness. History of COVID-19. EXAM: MRI HEAD WITHOUT CONTRAST TECHNIQUE: Multiplanar, multiecho pulse sequences of the brain and surrounding structures were obtained without intravenous contrast. COMPARISON:  Head CT 01/05/2014 FINDINGS: Brain: There is cortical restricted  diffusion posteriorly in the right frontal lobe consistent with acute infarct. Additional smaller regions of diffusion abnormality are present in the posterior left frontal lobe, anterior left frontal lobe, and right greater than left medial parietal lobes as well as right cerebellum with normal to slightly reduced ADC compatible with acute to early subacute infarcts. There is mild associated T2 hyperintensity/edema associated with the infarcts. A chronic microhemorrhage is noted in the region of the right external capsule. No mass, midline shift, or extra-axial fluid collection is identified. The ventricles and sulci are within normal limits for age. The pituitary gland is borderline prominent in height without a discrete sellar mass identified on this nondedicated study. Vascular: Major intracranial vascular flow voids are preserved. Skull and upper cervical spine: Unremarkable bone marrow signal. Sinuses/Orbits: Unremarkable orbits. Large bilateral mastoid effusions. Mild left frontal sinus mucosal thickening. Other: None. IMPRESSION: 1. Small acute to early subacute infarcts involving bilateral cerebral cortex and right cerebellum. 2. Large bilateral mastoid effusions. Electronically Signed   By: Logan Bores M.D.   On: 08/26/2019 19:33    PHYSICAL EXAM  Temp:  [97.9 F (36.6 C)-98.4 F (36.9 C)] 98.4  F (36.9 C) (11/05 1245) Pulse Rate:  [84-110] 98 (11/05 1445) Resp:  [17-29] 22 (11/05 1445) BP: (73-125)/(52-74) 92/61 (11/05 1445) SpO2:  [95 %-100 %] 100 % (11/05 1245) Weight:  [80.7 kg] 80.7 kg (11/05 1245)  General - Well nourished, well developed, trach in place, on vent.  Ophthalmologic - fundi not visualized due to noncooperation.  Cardiovascular - Regular rate and rhythm, not in afib.  Neuro - trach in place, on vent, no sedation. Eyes open spontaneously, eyes in midposition, not following commands. Not blinking to visual threat bilaterally, doll's eyes present, not tracking, pupil  left 98mm and right 2.66mm, reactive to light briskly. Corneal reflex present, gag and cough present. Breathing over the vent. Bilateral facial weakness, tongue protrusion not cooperative. On pain stimulation, no movement in all extremities. DTR diminished and no babinski. Sensation, coordination and gait not tested.   ASSESSMENT/PLAN Mr. Zev Blue is a 68 y.o. male with history of heartburn who was recently dx with COVID presenting to [facility] over 1 month ago for which he was hospitalized. Medical issues including VDRF, R pneumothorax, AKI, encephalopathy, MSSA bacteremia with negative echocardiogram. On 11/2 he was only able to open eyes and track examiner, but not moving extremities. He became progressively unresponsive. MRI showed early subacute B cerebral and R cerebellar infarcts.     Stroke - R cerebellar small infarct at border zone of SCA and PICA, likely due to hypotension secondary to dialysis and sepsis and deconditioning. PAF is also a potential etiology.   MRI 11/3 small R cerebellar infarcts, watershed area between SCA and PICA territory.  LE Doppler 10/8 negative for DVT  2D Echo 10/19 EF 60-65%. No source of embolus   EEG diffuse encephalopathy, no sz   Do not think need further cerebral vessel imaging at this time, as it will not change management.  LDL 149  HgbA1c 6.1  Heparin 7500 q 8h for VTE prophylaxis given COVID  No antithrombotic prior to admission, now on aspirin 325 mg daily.   Therapy recommendations:  SNF  Disposition:  pending   Anoxic brain injury - bilateral frontal R>L, likely due to hypoxia and hypotension  MRI 11/3 bilateral frontal gyrus ribboning, R>L, consistent with cortical ischemia, consistent with hypoxia an anoxic brain injury  Pt had frequent intermittent hypotension  Pt had hypoxia with COVID initially  Recommend BP goal 120-140   Hypotension  Sepsis/shock w/ hypotension  MSSA bacteremia but no endocarditis on  TTE  on pressors with neo  BP goal 120-140  Avoid low BP  Atrial Fibrillation w/ RVR, new onset  Unable to fully anticoagulate d/t GIB   GIB s/p EGD w/ gastric ulcer s/p injection and clippping.  Management as per primary team   COVID PNA and encephalopathy  Treated w/ remdesivir, dexamethasone, actemra, and convalescent plasma  Recent testing negative  No significant mental improvement - not following commands  PT/OT recommend SNF  VDRF  Emergent intubation in the Ed  S/p trach  On vent  Vent dependent   Acinetobacter and pseudomonal PNA  On meropenum x 7 d  Management per primary team  TTE no vegetation, no PFO  Hyperlipidemia  Home meds:  No statin  Now on lipitor 40   LDL 149, goal < 70  Continue statin at discharge  Dysphagia Malnutrition . NPO . On tube feedings  . SLP on board    Other Stroke Risk Factors  Advanced age  Limited ETOH use  Other Active Problems  AKI/CKD stage 3 on [facility] 10/15-11/1 aneuric w/ rising BUN/Cr, on HD 11/3  R pneumothorax/necrotic RLL not a surgical candidate per [place]  Pressure injuries to lip, buttocks  Hospital day # 34  Neurology will sign off. Please call with questions. Pt will follow up with stroke clinic NP at [facility] in about 4 weeks after discharge. Thanks for the consult.  Rosalin Hawking, MD PhD Stroke Neurology 08/28/2019 3:10 PM   I spent  35 minutes in total face-to-face time with the patient, more than 50% of which was spent in counseling and coordination of care, reviewing test results, images and medication, and discussing the diagnosis of COVID with multiple complications, treatment plan and potential prognosis. This patient's care requiresreview of multiple databases, neurological assessment, discussion with family, other specialists and medical decision making of high complexity. I had long discussion with wife at bedside, updated pt current condition, treatment plan and potential  prognosis, and answered all the questions. She expressed understanding and appreciation.   To contact Stroke Continuity provider, please refer to [email or web address] After hours, contact General Neurology

## 2019-08-28 NOTE — Progress Notes (Signed)
  Speech Language Pathology Treatment: Nada Boozer Speaking valve  Patient Details Name: [name] MRN: [id number] DOB: 06/03/1951 Today's Date: 08/28/2019 Time: 1207-1217 SLP Time Calculation (min) (ACUTE ONLY): 10 min  Assessment / Plan / Recommendation Clinical Impression  Pt very sleepy today after busy morning per wife.  Cuff deflated at baseline; teal PMV placed with adapter - vitals signs remained stable - pt with eyes open, upper airway patent with oral exhalation noted, but no voice achieved given fatigue.  Not following commands. Valve used only briefly; RN shown how valve should be placed with alternative adapter in place.  Wife educated re: use, purpose and precautions reinforced, sign hung over bed.  SLP will continue to follow for complex communication needs.   HPI HPI: 68 year old with a history of GERD and BPH who presented to [facility] ED with S OB and was found to be hypoxic.  He was initially diagnosed with COVID-19 September 20.  1 to 2 days prior to his presentation he developed worsening shortness of breath with anorexia. Admitted on 10/3, intubated.  EGD on 10/17 showed normal esophagus but excessive gastric fluid. CT scan of the chest on 10/21 showed findings consistent with an empyema as well as persistent pneumothorax.  Bedside bronchoscopy revealed material in the airway. Trached on 10/30. On ATC by 10/31.Additionally MRI on 11/4 showed There is cortical restricted diffusion posteriorly in the right frontal lobe consistent with acute infarct. Additional smaller regions of diffusion abnormality are present in the posterior left frontal lobe, anterior left frontal lobe, and right greater than left medial parietal lobes as well as right cerebellum.       SLP Plan  Continue with current plan of care       Recommendations         Patient may use Passy-Muir Speech Valve: Intermittently with supervision;During all therapies with supervision PMSV Supervision: Full MD:  Please consider changing trach tube to : Smaller size;Cuffless         Oral Care Recommendations: Oral care QID Follow up Recommendations: Inpatient Rehab SLP Visit Diagnosis: Aphonia (R49.1) Plan: Continue with current plan of care       GO                Juan Quam Laurice 08/28/2019, 1:06 PM  Mac Dowdell L. Tivis Ringer, [place] Office number [phone] Pager [phone]

## 2019-08-28 NOTE — Progress Notes (Signed)
PROGRESS NOTE    [name]  HFW:[id number] DOB: 1951-04-23 DOA: 07/25/2019 PCP: Kathyrn Drown, MD    Brief Narrative:  68 year old with a history of GERD and BPH who presented to [facility], ED with S OB and was found to be hypoxic. He was initially diagnosed with COVID-19 September 20. 1 to 2 days prior to his presentation he developed worsening shortness of breath with anorexia. His wife's physician during a teleconference call noted that the patient himself is very tachypneic and ultimately EMS was sent to the house. EMS found the patient to have saturations in the 40s. In the ED on nonrebreather the patient sats only improved to the 80s. The patient was severely confused and required emergent intubation. Chest x-ray confirmed multifocal infiltrates. CT chest was negative for PE  Assessment & Plan:   Principal Problem:   Pneumonia due to COVID-19 virus Active Problems:   BPH (benign prostatic hyperplasia)   Acute respiratory failure due to COVID-19 (HCC)   AKI (acute kidney injury) (HCC)   Fluid overload   Pneumothorax on right   Acute renal failure (ARF) (HCC)   GI bleed   Atrial fibrillation with RVR (HCC)   Constipation   Gastric ulcer with hemorrhage   Acute respiratory failure (HCC)   Chest tube in place   Primary spontaneous pneumothorax   Acute respiratory distress syndrome (ARDS) due to COVID-19 virus ([place])   Tracheostomy status (HCC)   Acute Hypoxic Resp. Failure/Pneumonia due to COVID-19 -2 negative COVID-19 test, last on 08/25/2019 -Pt remains afebrile without leukocytosis -Patient underwent tracheostomy on 10/30, somewhat still vent dependent Intermittent trach collar -Completed course of remdesivir and steroids -Patient also received Actemra and convalescent plasma -Continue pulmonary toilet/trach care per protocol -PCCM on board for assistance with vent management  Recent Labs       Lab Results  Component Value Date   SARSCOV2NAA  NEGATIVE 08/25/2019   [place] NEGATIVE 08/24/2019   Tensed Detected (A) 07/22/2019     COVID-19 specific Treatment: Decadron 10/2 > 10/12 Remdesivir 10/2 > 10/6 Actemra 10/2 Convalescent plasma 10/3  Encephalopathy/generalized weakness/deconditioned, acute CVA -MRI performed and reviewed, findings of bilateral infarcts -Have consulted Neurology for assistance -Recommendation for ASA, EEG, permissive HTN. 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 -CT scan of the chest on 10/21 showed findings consistent with an empyema as well as persistent pneumothorax   -TPA infused via chest tube on 10/23 -Endobronchial blocker removed on 10/25 -Continuing chest tube management per pulmonology  MSSA bacteremia/septic shock -Currently afebrile, with no leukocytosis -2 out of 2 blood cultures positive on 10/9, repeat BC have been negative, last on 08/20/19 NGTD -Echo done on 08/11/2019 was negative -Concern remains for recurrent bacteremia and possible endocarditis, ID recommended Cefazolin to be continued for 24 days from 10/31 -Presently stable  Pseudomonas and Acinetobacter in BAL specimen -Patient was started on meropenem on 10/23 for 10-day treatment -Completed on 08/25/19  AKI on CKD stage 3 -Remains anuric, HD dependent -Status post CRRT, last done on 11/1 -Initiated HD on 08/26/2019 -Nephrology following  Acute GI bleed secondary to gastric ulcer -S/p EGD with clipping of vessel and large ulcer -H. pylori antibody was noted to be elevated -[facility] had been following -Continue PPI -Patient will need 2 weeks treatment for H. pylori when he is ready for discharge -Anticipate  f/u with [facility] gastroenterology when discharged  Acute blood loss anemia -Likely secondary to  above -Hemoglobin stable at new baseline -No signs of overt bleeding -Patient has been transfused with 5 units of PRBC so far during this hospitalization Last transfused with 2 units on 10/28 -Currently on ASA per above  Elevated D-dimer -No evidence for DVT on Dopplers on 10/8 -Thought to be related to suspected empyema in the right lung -Remains stable at this time  New onset paroxysmal atrial fibrillation with RVR -Heart rate controlled, currently sinus rhythm -Atrial fibrillation likely occurred in the setting of acute illness -Unable to fully anticoagulate due to GI bleeding -See above, acute stroke identified. Have consulted Neurology. Appreciate input. Recommendation to continue ASA. Unable to tolerate anticoagulation secondary to hx of GI bleeding  Pressure injuries Pressure Injury 08/16/19 Lip Medial;Lower Stage I -  Intact skin with non-blanchable redness of a localized area usually over a bony prominence. healing  (Active)  08/16/19 2000  Location: Lip  Location Orientation: Medial;Lower  Staging: Stage I -  Intact skin with non-blanchable redness of a localized area usually over a bony prominence.  Wound Description (Comments): healing   Present on Admission: No     Pressure Injury 08/16/19 Lip Lower;Mid Stage I -  Intact skin with non-blanchable redness of a localized area usually over a bony prominence. healing  (Active)  08/16/19 2342  Location: Lip  Location Orientation: Lower;Mid  Staging: Stage I -  Intact skin with non-blanchable redness of a localized area usually over a bony prominence.  Wound Description (Comments): healing   Present on Admission:      Pressure Injury 08/24/19 Buttocks Right;Left;Mid Deep Tissue Injury - Purple or maroon localized area of discolored intact skin or blood-filled blister due to damage of underlying soft tissue from pressure and/or shear. (Active)  08/24/19 0800  Location: Buttocks  Location Orientation: Right;Left;Mid   Staging: Deep Tissue Injury - Purple or maroon localized area of discolored intact skin or blood-filled blister due to damage of underlying soft tissue from pressure and/or shear.  Wound Description (Comments):   Present on Admission: No    DVT prophylaxis: heparin subQ Code Status: DNR Family Communication: Pt in room, wife at bedside Disposition Plan: Uncertain at this time  Consultants:  Pulmonology.  Nephrology.  Gastroenterology., Neurology  Procedures:     Antimicrobials: Anti-infectives (From admission, onward)   Start     Dose/Rate Route Frequency Ordered Stop   08/26/19 2200  ceFAZolin (ANCEF) IVPB 1 g/50 mL premix     1 g 100 mL/hr over 30 Minutes Intravenous Every 24 hours 08/24/19 1021 09/15/19 2359   08/24/19 1100  meropenem (MERREM) 1 g in sodium chloride 0.9 % 100 mL IVPB     1 g 200 mL/hr over 30 Minutes Intravenous Every 24 hours 08/24/19 1021 08/25/19 1156   08/23/19 1100  ceFAZolin (ANCEF) IVPB 2g/100 mL premix  Status:  Discontinued     2 g 200 mL/hr over 30 Minutes Intravenous Every 12 hours 08/23/19 1048 08/24/19 1021   08/21/19 1700  vancomycin (VANCOCIN) IVPB 750 mg/150 ml premix  Status:  Discontinued     750 mg 150 mL/hr over 60 Minutes Intravenous Every 24 hours 08/21/19 0843 08/23/19 1048   08/21/19 1400  vancomycin (VANCOCIN) IVPB 750 mg/150 ml premix  Status:  Discontinued     750 mg 150 mL/hr over 60 Minutes Intravenous Every 24 hours 08/20/19 1146 08/20/19 1218   08/21/19 1000  meropenem (MERREM) 1 g in  sodium chloride 0.9 % 100 mL IVPB  Status:  Discontinued     1 g 200 mL/hr over 30 Minutes Intravenous Every 12 hours 08/20/19 1535 08/23/19 1048   08/21/19 0413  meropenem (MERREM) 500 mg in sodium chloride 0.9 % 100 mL IVPB  Status:  Discontinued     500 mg 200 mL/hr over 30 Minutes Intravenous Every 24 hours 08/20/19 1245 08/20/19 1535   08/20/19 1330  vancomycin (VANCOCIN) 1,750 mg in sodium chloride 0.9 % 500 mL IVPB     1,750 mg 250  mL/hr over 120 Minutes Intravenous  Once 08/20/19 1222 08/20/19 1619   08/20/19 1300  vancomycin (VANCOCIN) 1,250 mg in sodium chloride 0.9 % 250 mL IVPB  Status:  Discontinued     1,250 mg 166.7 mL/hr over 90 Minutes Intravenous  Once 08/20/19 1146 08/20/19 1218   08/20/19 1224  vancomycin variable dose per unstable renal function (pharmacist dosing)  Status:  Discontinued      Does not apply See admin instructions 08/20/19 1225 08/21/19 1152   08/15/19 1600  meropenem (MERREM) 1 g in sodium chloride 0.9 % 100 mL IVPB  Status:  Discontinued     1 g 200 mL/hr over 30 Minutes Intravenous Every 12 hours 08/15/19 1543 08/20/19 1245   08/15/19 1000  voriconazole (VFEND) tablet 200 mg  Status:  Discontinued     200 mg Per Tube Every 12 hours 08/14/19 1148 08/15/19 1134   08/14/19 1600  anidulafungin (ERAXIS) 100 mg in sodium chloride 0.9 % 100 mL IVPB  Status:  Discontinued     100 mg 78 mL/hr over 100 Minutes Intravenous Every 24 hours 08/13/19 1645 08/14/19 1148   08/14/19 1300  voriconazole (VFEND) tablet 400 mg     400 mg Per Tube Every 12 hours 08/14/19 1148 08/14/19 2217   08/13/19 1700  anidulafungin (ERAXIS) 200 mg in sodium chloride 0.9 % 200 mL IVPB     200 mg 78 mL/hr over 200 Minutes Intravenous  Once 08/13/19 1645 08/13/19 2030   08/13/19 0200  ceFAZolin (ANCEF) IVPB 2g/100 mL premix  Status:  Discontinued     2 g 200 mL/hr over 30 Minutes Intravenous Every 12 hours 08/11/19 1512 08/15/19 1543   08/12/19 0200  ceFEPIme (MAXIPIME) 2 g in sodium chloride 0.9 % 100 mL IVPB     2 g 200 mL/hr over 30 Minutes Intravenous Every 12 hours 08/11/19 1509 08/12/19 1341   08/07/19 2200  ceFEPIme (MAXIPIME) 2 g in sodium chloride 0.9 % 100 mL IVPB  Status:  Discontinued     2 g 200 mL/hr over 30 Minutes Intravenous Every 12 hours 08/07/19 1540 08/11/19 1509   08/07/19 1000  anidulafungin (ERAXIS) 100 mg in sodium chloride 0.9 % 100 mL IVPB  Status:  Discontinued     100 mg 78 mL/hr over 100  Minutes Intravenous Every 24 hours 08/06/19 0934 08/08/19 1203   08/07/19 0800  ceFEPIme (MAXIPIME) 2 g in sodium chloride 0.9 % 100 mL IVPB  Status:  Discontinued     2 g 200 mL/hr over 30 Minutes Intravenous Every 24 hours 08/06/19 1036 08/07/19 1540   08/06/19 1000  anidulafungin (ERAXIS) 200 mg in sodium chloride 0.9 % 200 mL IVPB    Note to Pharmacy: please   200 mg 78 mL/hr over 200 Minutes Intravenous Every 24 hours 08/06/19 0925 08/06/19 1600   08/06/19 0800  ceFEPIme (MAXIPIME) 2 g in sodium chloride 0.9 % 100 mL IVPB  Status:  Discontinued  2 g 200 mL/hr over 30 Minutes Intravenous Every 12 hours 08/06/19 0733 08/06/19 1036   08/02/19 2200  ceFAZolin (ANCEF) IVPB 2g/100 mL premix  Status:  Discontinued     2 g 200 mL/hr over 30 Minutes Intravenous Every 8 hours 08/02/19 1338 08/06/19 0733   08/02/19 1400  vancomycin (VANCOCIN) 1,250 mg in sodium chloride 0.9 % 250 mL IVPB  Status:  Discontinued     1,250 mg 166.7 mL/hr over 90 Minutes Intravenous Every 24 hours 08/01/19 1320 08/02/19 1332   08/01/19 1330  vancomycin (VANCOCIN) 2,000 mg in sodium chloride 0.9 % 500 mL IVPB     2,000 mg 250 mL/hr over 120 Minutes Intravenous  Once 08/01/19 1158 08/01/19 1636   08/01/19 1300  ceFEPIme (MAXIPIME) 2 g in sodium chloride 0.9 % 100 mL IVPB  Status:  Discontinued     2 g 200 mL/hr over 30 Minutes Intravenous Every 12 hours 08/01/19 1158 08/02/19 1332   07/26/19 1600  remdesivir 100 mg in sodium chloride 0.9 % 250 mL IVPB     100 mg 500 mL/hr over 30 Minutes Intravenous Every 24 hours 07/26/19 0132 07/29/19 1823   07/26/19 0215  remdesivir 200 mg in sodium chloride 0.9 % 250 mL IVPB     200 mg 500 mL/hr over 30 Minutes Intravenous Once 07/26/19 0132 07/26/19 0520      Subjective: Cannot obtain given mentation, however seems more alert this AM  Objective: Vitals:   08/28/19 1330 08/28/19 1345 08/28/19 1400 08/28/19 1407  BP: 97/64 (!) 86/67 (!) 79/63 (!) 91/59  Pulse: (!)  110 (!) 109 (!) 109   Resp: (!) 26 (!) 27 (!) 28   Temp:      TempSrc:      SpO2:      Weight:      Height:        Intake/Output Summary (Last 24 hours) at 08/28/2019 1419 Last data filed at 08/28/2019 6811 Gross per 24 hour  Intake 930 ml  Output 670 ml  Net 260 ml   Filed Weights   08/24/19 0500 08/26/19 0700 08/28/19 1245  Weight: 72.9 kg 79.8 kg 80.7 kg    Examination: General exam: Awake, laying in bed, in nad Respiratory system: Normal respiratory effort, no wheezing Cardiovascular system: regular rate, s1, s2 Gastrointestinal system: Soft, nondistended, positive BS Central nervous system: CN2-12 grossly intact, generally flacid Extremities: Perfused, no clubbing Skin: Normal skin turgor, no notable skin lesions seen Psychiatry: Unable to assess at this time  Data Reviewed: I have personally reviewed following labs and imaging studies  CBC: Recent Labs  Lab 08/23/19 0415  08/24/19 0515 08/25/19 0517 08/26/19 0724 08/27/19 0336 08/28/19 0409  WBC 7.8  --  9.2 10.2 9.8 8.3 7.7  NEUTROABS 5.7  --  6.9 8.1*  --  6.1 5.7  HGB 8.3*   < > 8.7* 8.6* 8.3* 7.8* 7.9*  HCT 27.1*   < > 28.6* 28.0* 27.2* 25.0* 25.4*  MCV 96.4  --  95.3 96.2 96.5 94.3 93.4  PLT 179  --  194 246 295 272 310   < > = values in this interval not displayed.   Basic Metabolic Panel: Recent Labs  Lab 08/22/19 0415  08/23/19 0415  08/23/19 1550 08/24/19 0515 08/25/19 0517 08/26/19 0724 08/27/19 0336 08/28/19 0409  NA 137   < > 136   < > 136 137 138 140 139 139  K 5.0   < > 4.6   < >  4.5 4.4 4.9 6.2* 4.8 6.2*  CL 103   < > 102  --  104 103 103 105 101 101  CO2 28   < > 26  --  _0 19* 21* 19*  GLUCOSE 140*   < > 124*  --  103* 117* 123* 139* 117* 124*  BUN 48*   < > 45*  --  41* 38* 100* 145* 91* 146*  CREATININE 2.37*   < > 2.01*  --  2.02* 1.60* 4.02* 6.43* 4.44* 6.61*  CALCIUM 8.4*   < > 8.5*  --  8.5* 8.7* 9.5 9.6 9.3 9.4  MG 2.5*  --  2.5*  --   --  2.6* 2.9*  --   --   --    PHOS 3.9   < > 3.5  --  3.3 2.9 5.7* 9.5*  --   --    < > = values in this interval not displayed.   GFR: Estimated Creatinine Clearance: 11.4 mL/min (A) (by C-G formula based on SCr of 6.61 mg/dL (H)). Liver Function Tests: Recent Labs  Lab 08/22/19 1750 08/23/19 0415 08/23/19 1550 08/24/19 0515 08/26/19 0724  ALBUMIN 2.1* 2.3* 2.4* 2.5* 2.1*   No results for input(s): LIPASE, AMYLASE in the last 168 hours. No results for input(s): AMMONIA in the last 168 hours. Coagulation Profile: Recent Labs  Lab 08/22/19 1121  INR 1.0   Cardiac Enzymes: No results for input(s): CKTOTAL, CKMB, CKMBINDEX, TROPONINI in the last 168 hours. BNP (last 3 results) No results for input(s): PROBNP in the last 8760 hours. HbA1C: No results for input(s): HGBA1C in the last 72 hours. CBG: Recent Labs  Lab 08/27/19 2000 08/27/19 2350 08/28/19 0356 08/28/19 0821 08/28/19 1057  GLUCAP 119* 119* 110* 105* 157*   Lipid Profile: No results for input(s): CHOL, HDL, LDLCALC, TRIG, CHOLHDL, LDLDIRECT in the last 72 hours. Thyroid Function Tests: No results for input(s): TSH, T4TOTAL, FREET4, T3FREE, THYROIDAB in the last 72 hours. Anemia Panel: No results for input(s): VITAMINB12, FOLATE, FERRITIN, TIBC, IRON, RETICCTPCT in the last 72 hours. Sepsis Labs: Recent Labs  Lab 08/22/19 0415 08/25/19 0517  PROCALCITON 4.38 5.82    Recent Results (from the past 240 hour(s))  Culture, blood (Routine X 2) w Reflex to ID Panel     Status: None   Collection Time: 08/20/19 12:35 PM   Specimen: BLOOD  Result Value Ref Range Status   Specimen Description   Final    BLOOD LEFT HAND Performed at [facility] [address] [place], [place] [address]    Special Requests   Final    BOTTLES DRAWN AEROBIC AND ANAEROBIC Blood Culture adequate volume Performed at [facility] [address], [place], [place] [address]    Culture   Final    NO GROWTH 5  DAYS Performed at [facility], [place] [address] [place], [place] [address]    Report Status 08/25/2019 FINAL  Final  Culture, blood (Routine X 2) w Reflex to ID Panel     Status: None   Collection Time: 08/20/19 12:48 PM   Specimen: BLOOD  Result Value Ref Range Status   Specimen Description   Final    BLOOD LEFT ARM Performed at [facility] [address], [place], [place] [address]    Special Requests   Final    BOTTLES DRAWN AEROBIC ONLY Blood Culture adequate volume Performed at [facility]  [address] [place], [place] [address]    Culture   Final    NO GROWTH 5 DAYS Performed at [facility], [place] [address] [place], [place] [address]    Report Status 08/25/2019 FINAL  Final  Culture, respiratory     Status: None   Collection Time: 08/20/19 12:54 PM   Specimen: Tracheal Aspirate  Result Value Ref Range Status   Specimen Description   Final    TRACHEAL ASPIRATE Performed at [facility] [address], [place], [place] [address]    Special Requests   Final    NONE Performed at [facility], [place] [address], [place], [place] [address]    Gram Stain   Final    ABUNDANT WBC PRESENT,BOTH PMN AND MONONUCLEAR NO ORGANISMS SEEN Performed at [facility], [place] [address] [place], [place] [address]    Culture RARE PSEUDOMONAS AERUGINOSA  Final   Report Status 08/23/2019 FINAL  Final   Organism ID, Bacteria PSEUDOMONAS AERUGINOSA  Final      Susceptibility   Pseudomonas aeruginosa - MIC*    CEFTAZIDIME 4 SENSITIVE Sensitive     CIPROFLOXACIN <=0.25 SENSITIVE Sensitive     GENTAMICIN <=1 SENSITIVE Sensitive     IMIPENEM 1 SENSITIVE Sensitive     PIP/TAZO 8 SENSITIVE Sensitive     CEFEPIME 4 SENSITIVE Sensitive     * RARE PSEUDOMONAS AERUGINOSA  SARS CORONAVIRUS 2 (TAT 6-24 HRS) Nasopharyngeal Nasopharyngeal Swab     Status: None   Collection Time: 08/24/19  1:55 PM    Specimen: Nasopharyngeal Swab  Result Value Ref Range Status   SARS Coronavirus 2 NEGATIVE NEGATIVE Final    Comment: (NOTE) SARS-CoV-2 target nucleic acids are NOT DETECTED. The SARS-CoV-2 RNA is generally detectable in upper and lower respiratory specimens during the acute phase of infection. Negative results do not preclude SARS-CoV-2 infection, do not rule out co-infections with other pathogens, and should not be used as the sole basis for treatment or other patient management decisions. Negative results must be combined with clinical observations, patient history, and epidemiological information. The expected result is Negative. Fact Sheet for Patients: SugarRoll.be Fact Sheet for Healthcare Providers: https://www.woods-mathews.com/ This test is not yet approved or cleared by the [place] FDA and  has been authorized for detection and/or diagnosis of SARS-CoV-2 by FDA under an Emergency Use Authorization (EUA). This EUA will remain  in effect (meaning this test can be used) for the duration of the COVID-19 declaration under Section 56 4(b)(1) of the Act, 21 U.S.C. section 360bbb-3(b)(1), unless the authorization is terminated or revoked sooner. Performed at [facility], [place] [address] [place], [place] [address]   SARS CORONAVIRUS 2 (TAT 6-24 HRS) Nasopharyngeal Nasopharyngeal Swab     Status: None   Collection Time: 08/25/19 11:37 AM   Specimen: Nasopharyngeal Swab  Result Value Ref Range Status   SARS Coronavirus 2 NEGATIVE NEGATIVE Final    Comment: (NOTE) SARS-CoV-2 target nucleic acids are NOT DETECTED. The SARS-CoV-2 RNA is generally detectable in upper and lower respiratory specimens during the acute phase of infection. Negative results do not preclude SARS-CoV-2 infection, do not rule out co-infections with other pathogens, and should not be used as the sole basis for treatment or other patient management  decisions. Negative results must be combined with clinical observations, patient history, and epidemiological information. The expected result is Negative. Fact Sheet for Patients: SugarRoll.be Fact Sheet for Healthcare Providers: https://www.woods-mathews.com/ This test is not  yet approved or cleared by the [place] and  has been authorized for detection and/or diagnosis of SARS-CoV-2 by FDA under an Emergency Use Authorization (EUA). This EUA will remain  in effect (meaning this test can be used) for the duration of the COVID-19 declaration under Section 56 4(b)(1) of the Act, 21 U.S.C. section 360bbb-3(b)(1), unless the authorization is terminated or revoked sooner. Performed at [facility], [place] [address], [place], [place] [address]      Radiology Studies: Mr Brain Wo Contrast  Result Date: 08/26/2019 CLINICAL DATA:  Altered level of consciousness. History of COVID-19. EXAM: MRI HEAD WITHOUT CONTRAST TECHNIQUE: Multiplanar, multiecho pulse sequences of the brain and surrounding structures were obtained without intravenous contrast. COMPARISON:  Head CT 01/05/2014 FINDINGS: Brain: There is cortical restricted diffusion posteriorly in the right frontal lobe consistent with acute infarct. Additional smaller regions of diffusion abnormality are present in the posterior left frontal lobe, anterior left frontal lobe, and right greater than left medial parietal lobes as well as right cerebellum with normal to slightly reduced ADC compatible with acute to early subacute infarcts. There is mild associated T2 hyperintensity/edema associated with the infarcts. A chronic microhemorrhage is noted in the region of the right external capsule. No mass, midline shift, or extra-axial fluid collection is identified. The ventricles and sulci are within normal limits for age. The pituitary gland is borderline prominent in height without a discrete sellar  mass identified on this nondedicated study. Vascular: Major intracranial vascular flow voids are preserved. Skull and upper cervical spine: Unremarkable bone marrow signal. Sinuses/Orbits: Unremarkable orbits. Large bilateral mastoid effusions. Mild left frontal sinus mucosal thickening. Other: None. IMPRESSION: 1. Small acute to early subacute infarcts involving bilateral cerebral cortex and right cerebellum. 2. Large bilateral mastoid effusions. Electronically Signed   By: Logan Bores M.D.   On: 08/26/2019 19:33    Scheduled Meds:  aspirin  325 mg Per Tube Daily   atorvastatin  40 mg Oral q1800   chlorhexidine  15 mL Mouth/Throat BID   Chlorhexidine Gluconate Cloth  6 each Topical Daily   darbepoetin (ARANESP) injection - NON-DIALYSIS  100 mcg Subcutaneous Q Wed-1800   famotidine  20 mg Per Tube Daily   feeding supplement (PRO-STAT SUGAR FREE 64)  60 mL Per Tube BID   Gerhardt's butt cream   Topical TID   heparin injection (subcutaneous)  7,500 Units Subcutaneous Q8H   insulin aspart  0-9 Units Subcutaneous Q4H   mouth rinse  15 mL Mouth Rinse 10 times per day   [START ON 08/29/2019] sodium zirconium cyclosilicate  10 g Oral Daily   sucralfate  1 g Oral Q6H   Continuous Infusions:  sodium chloride Stopped (08/21/19 1920)   sodium chloride 10 mL/hr at 08/25/19 2118   sodium chloride     sodium chloride      ceFAZolin (ANCEF) IV Stopped (08/27/19 2134)   dextrose Stopped (08/16/19 1623)   feeding supplement (VITAL 1.5 CAL) 1,000 mL (08/28/19 1209)   phenylephrine (NEO-SYNEPHRINE) Adult infusion Stopped (08/26/19 1602)     LOS: 34 days   Marylu Lund, MD [facility] Pager On Amion  If 7PM-7AM, please contact night-coverage 08/28/2019, 2:19 PM

## 2019-08-28 NOTE — Progress Notes (Signed)
The chaplain received a request to visit with the patient and to offer prayer.  The chaplain prayed for the patient and all the patient could do in response is open their eyes.  The chaplain will follow-up if needed.  Brion Aliment Chaplain Resident For questions concerning this note please contact me by pager [phone]

## 2019-08-28 NOTE — Progress Notes (Signed)
Patient ID: [name], male   DOB: 08-11-1951, 68 y.o.   MRN: [id number] S: No events overnight O:BP 122/74 (BP Location: Left Arm)   Pulse 88   Temp 98 F (36.7 C) (Axillary)   Resp 19   Ht 5\' 10"  (1.778 m)   Wt 79.8 kg   SpO2 99%   BMI 25.24 kg/m   Intake/Output Summary (Last 24 hours) at 08/28/2019 0948 Last data filed at 08/28/2019 0612 Gross per 24 hour  Intake 1210 ml  Output 670 ml  Net 540 ml   Intake/Output: I/O last 3 completed shifts: In: 2126.8 [I.V.:26.8; Other:35; SV/XB:9390; IV Piggyback:100] Out: 300 [Stool:900; Chest Tube:20]  Intake/Output this shift:  No intake/output data recorded. Weight change:  Gen: chronically ill appearing CVS: no rub Resp: scattered rhonchi and transmitted upper airway sounds Abd: +BS, soft Ext: +edema  Recent Labs  Lab 08/21/19 1558  08/22/19 0415 08/22/19 1750 08/23/19 0415 08/23/19 0453 08/23/19 1550 08/24/19 0515 08/25/19 0517 08/26/19 0724 08/27/19 0336 08/28/19 0409  NA 138   < > 137 138 136 136 136 137 138 140 139 139   K 5.0   < > 5.0 5.9* 4.6 4.6 4.5 4.4 4.9 6.2* 4.8 6.2*  CL 104  --  103 103 102  --  104 103 103 105 101 101  CO2 27  --  28 25 26   --  25 26 22  19* 21* 19*  GLUCOSE 150*  --  140* 107* 124*  --  103* 117* 123* 139* 117* 124*  BUN 61*  --  48* 56* 45*  --  41* 38* 100* 145* 91* 146*  CREATININE 2.79*  --  2.37* 2.92* 2.01*  --  2.02* 1.60* 4.02* 6.43* 4.44* 6.61*  ALBUMIN 2.2*  --  2.1* 2.1* 2.3*  --  2.4* 2.5*  --  2.1*  --   --   CALCIUM 8.5*  --  8.4* 8.8* 8.5*  --  8.5* 8.7* 9.5 9.6 9.3 9.4  PHOS 3.9  --  3.9 7.1* 3.5  --  3.3 2.9 5.7* 9.5*  --   --    < > = values in this interval not displayed.   Liver Function Tests: Recent Labs  Lab 08/23/19 1550 08/24/19 0515 08/26/19 0724  ALBUMIN 2.4* 2.5* 2.1*   No results for input(s): LIPASE, AMYLASE in the last 168 hours. No results for input(s): AMMONIA in the last 168 hours. CBC: Recent Labs  Lab 08/24/19 0515 08/25/19 0517  08/26/19 0724 08/27/19 0336 08/28/19 0409  WBC 9.2 10.2 9.8 8.3 7.7  NEUTROABS 6.9 8.1*  --  6.1 5.7  HGB 8.7* 8.6* 8.3* 7.8* 7.9*  HCT 28.6* 28.0* 27.2* 25.0* 25.4*  MCV 95.3 96.2 96.5 94.3 93.4  PLT 194 246 295 272 310   Cardiac Enzymes: No results for input(s): CKTOTAL, CKMB, CKMBINDEX, TROPONINI in the last 168 hours. CBG: Recent Labs  Lab 08/27/19 1537 08/27/19 2000 08/27/19 2350 08/28/19 0356 08/28/19 0821  GLUCAP 113* 119* 119* 110* 105*    Iron Studies: No results for input(s): IRON, TIBC, TRANSFERRIN, FERRITIN in the last 72 hours. Studies/Results: Mr Brain Wo Contrast  Result Date: 08/26/2019 CLINICAL DATA:  Altered level of consciousness. History of COVID-19. EXAM: MRI HEAD WITHOUT CONTRAST TECHNIQUE: Multiplanar, multiecho pulse sequences of the brain and surrounding structures were obtained without intravenous contrast. COMPARISON:  Head CT 01/05/2014 FINDINGS: Brain: There is cortical restricted diffusion posteriorly in the right frontal lobe consistent with acute infarct. Additional smaller regions of diffusion  abnormality are present in the posterior left frontal lobe, anterior left frontal lobe, and right greater than left medial parietal lobes as well as right cerebellum with normal to slightly reduced ADC compatible with acute to early subacute infarcts. There is mild associated T2 hyperintensity/edema associated with the infarcts. A chronic microhemorrhage is noted in the region of the right external capsule. No mass, midline shift, or extra-axial fluid collection is identified. The ventricles and sulci are within normal limits for age. The pituitary gland is borderline prominent in height without a discrete sellar mass identified on this nondedicated study. Vascular: Major intracranial vascular flow voids are preserved. Skull and upper cervical spine: Unremarkable bone marrow signal. Sinuses/Orbits: Unremarkable orbits. Large bilateral mastoid effusions. Mild left  frontal sinus mucosal thickening. Other: None. IMPRESSION: 1. Small acute to early subacute infarcts involving bilateral cerebral cortex and right cerebellum. 2. Large bilateral mastoid effusions. Electronically Signed   By: Logan Bores M.D.   On: 08/26/2019 19:33   Dg Chest Port 1 View  Result Date: 08/26/2019 CLINICAL DATA:  Pneumothorax. EXAM: PORTABLE CHEST 1 VIEW COMPARISON:  08/23/2019. FINDINGS: Tracheostomy tube, left IJ line, feeding tube, right chest tube in stable position. Stable right base pneumothorax. Stable atelectatic changes/infiltrate right upper lung and left base. Heart size stable. IMPRESSION: 1. Lines and tubes including right chest tube in stable position. Stable right base pneumothorax. 2. Stable right upper lung and left lower atelectatic changes/infiltrates. Chest is unchanged from prior exam. Electronically Signed   By: Marcello Moores  Register   On: 08/26/2019 10:36   . aspirin  325 mg Per Tube Daily  . atorvastatin  40 mg Oral q1800  . chlorhexidine  15 mL Mouth/Throat BID  . Chlorhexidine Gluconate Cloth  6 each Topical Daily  . darbepoetin (ARANESP) injection - NON-DIALYSIS  100 mcg Subcutaneous Q Wed-1800  . famotidine  20 mg Per Tube Daily  . feeding supplement (PRO-STAT SUGAR FREE 64)  60 mL Per Tube BID  . Gerhardt's butt cream   Topical TID  . heparin injection (subcutaneous)  7,500 Units Subcutaneous Q8H  . insulin aspart  0-9 Units Subcutaneous Q4H  . mouth rinse  15 mL Mouth Rinse 10 times per day  . sucralfate  1 g Oral Q6H    BMET    Component Value Date/Time   NA 139 08/28/2019 0409   NA 143 11/08/2017 1030   K 6.2 (H) 08/28/2019 0409   CL 101 08/28/2019 0409   CO2 19 (L) 08/28/2019 0409   GLUCOSE 124 (H) 08/28/2019 0409   BUN 146 (H) 08/28/2019 0409   BUN 8 11/08/2017 1030   CREATININE 6.61 (H) 08/28/2019 0409   CREATININE 1.15 11/20/2014 1224   CALCIUM 9.4 08/28/2019 0409   GFRNONAA 8 (L) 08/28/2019 0409   GFRAA 9 (L) 08/28/2019 0409   CBC     Component Value Date/Time   WBC 7.7 08/28/2019 0409   RBC 2.72 (L) 08/28/2019 0409   HGB 7.9 (L) 08/28/2019 0409   HGB 15.8 11/08/2017 1030   HCT 25.4 (L) 08/28/2019 0409   HCT 46.6 11/08/2017 1030   PLT 310 08/28/2019 0409   PLT 202 11/08/2017 1030   MCV 93.4 08/28/2019 0409   MCV 93 11/08/2017 1030   MCH 29.0 08/28/2019 0409   MCHC 31.1 08/28/2019 0409   RDW 16.7 (H) 08/28/2019 0409   RDW 14.2 11/08/2017 1030   LYMPHSABS 0.9 08/28/2019 0409   LYMPHSABS 1.1 11/08/2017 1030   MONOABS 0.4 08/28/2019 0409   EOSABS  0.4 08/28/2019 0409   EOSABS 0.1 11/08/2017 1030   BASOSABS 0.0 08/28/2019 0409   BASOSABS 0.0 11/08/2017 1030    Assessment/Plan:  1. AKI/CKD stage 3- in setting of covid PNA and has remained oliguric/anuric. Started on CVVHD 08/07/19-08/24/19. He is off pressors but remains anuric with rising BUN/Cr. 1. He was transferred to [facility] for IHD 08/25/19 but required pressors 2. Hyperkalemia and markedly elevated BUN likely due to hypercatabolic state and plan for HD again today.  If he cannot tolerate it hemodynamically, will likely have to resume CVVHD. 2. COVID PNA- treated with remdesivir, dexamethasone, and convalescent plasma. Most recent covid test was negative. 3. VDRF - s/p trach and on vent per PCCM 4. Sepsis/shock- off of pressors 5. Right pneumothorax/emphyema/necrotic RLL- s/p chest tube with air leak. Per CT Surgery he is not a candidate for VATS, however concern that necrotic RLL is source of hypercatabolic state. 6. New cerebral and cerebellar strokes- per primary svc. 7. A fib/RVR- per PCCM 8. GIB- acute s/p EGD which showed gastric ulcer with stigmata of recent bleeding s/p injection and clipping. Able to tolerate low dose heparin with CVVHD before stopping it. 9. MSSA bacteremia- treated 10. Acinetobacter and pseudomonal pna- per PCCM, meropenem for 7 days. [place], MD [facility] [phone]

## 2019-08-29 DIAGNOSIS — N179 Acute kidney failure, unspecified: Secondary | ICD-10-CM | POA: Diagnosis not present

## 2019-08-29 DIAGNOSIS — A4901 Methicillin susceptible Staphylococcus aureus infection, unspecified site: Secondary | ICD-10-CM | POA: Diagnosis not present

## 2019-08-29 DIAGNOSIS — U071 COVID-19: Secondary | ICD-10-CM | POA: Diagnosis not present

## 2019-08-29 DIAGNOSIS — E872 Acidosis: Secondary | ICD-10-CM | POA: Diagnosis not present

## 2019-08-29 DIAGNOSIS — J9600 Acute respiratory failure, unspecified whether with hypoxia or hypercapnia: Secondary | ICD-10-CM | POA: Diagnosis not present

## 2019-08-29 DIAGNOSIS — J1289 Other viral pneumonia: Secondary | ICD-10-CM | POA: Diagnosis not present

## 2019-08-29 DIAGNOSIS — J80 Acute respiratory distress syndrome: Secondary | ICD-10-CM | POA: Diagnosis not present

## 2019-08-29 DIAGNOSIS — R579 Shock, unspecified: Secondary | ICD-10-CM | POA: Diagnosis not present

## 2019-08-29 DIAGNOSIS — J939 Pneumothorax, unspecified: Secondary | ICD-10-CM | POA: Diagnosis not present

## 2019-08-29 DIAGNOSIS — I959 Hypotension, unspecified: Secondary | ICD-10-CM | POA: Diagnosis not present

## 2019-08-29 DIAGNOSIS — D649 Anemia, unspecified: Secondary | ICD-10-CM | POA: Diagnosis not present

## 2019-08-29 DIAGNOSIS — J9311 Primary spontaneous pneumothorax: Secondary | ICD-10-CM | POA: Diagnosis not present

## 2019-08-29 LAB — BASIC METABOLIC PANEL
Anion gap: 14 (ref 5–15)
BUN: 83 mg/dL — ABNORMAL HIGH (ref 8–23)
CO2: 23 mmol/L (ref 22–32)
Calcium: 8.5 mg/dL — ABNORMAL LOW (ref 8.9–10.3)
Chloride: 99 mmol/L (ref 98–111)
Creatinine, Ser: 4.21 mg/dL — ABNORMAL HIGH (ref 0.61–1.24)
GFR calc Af Amer: 16 mL/min — ABNORMAL LOW (ref >60)
GFR calc non Af Amer: 14 mL/min — ABNORMAL LOW (ref >60)
Glucose, Bld: 115 mg/dL — ABNORMAL HIGH (ref 70–99)
Potassium: 5.3 mmol/L — ABNORMAL HIGH (ref 3.5–5.1)
Sodium: 136 mmol/L (ref 135–145)

## 2019-08-29 LAB — CBC WITH DIFFERENTIAL/PLATELET
Abs Immature Granulocytes: 0.22 10*3/uL — ABNORMAL HIGH (ref 0.00–0.07)
Basophils Absolute: 0 10*3/uL (ref 0.0–0.1)
Basophils Relative: 0 %
Eosinophils Absolute: 0.4 10*3/uL (ref 0.0–0.5)
Eosinophils Relative: 5 %
HCT: 24.2 % — ABNORMAL LOW (ref 39.0–52.0)
Hemoglobin: 7.4 g/dL — ABNORMAL LOW (ref 13.0–17.0)
Immature Granulocytes: 3 %
Lymphocytes Relative: 10 %
Lymphs Abs: 0.9 10*3/uL (ref 0.7–4.0)
MCH: 29.4 pg (ref 26.0–34.0)
MCHC: 30.6 g/dL (ref 30.0–36.0)
MCV: 96 fL (ref 80.0–100.0)
Monocytes Absolute: 0.6 10*3/uL (ref 0.1–1.0)
Monocytes Relative: 7 %
Neutro Abs: 6.6 10*3/uL (ref 1.7–7.7)
Neutrophils Relative %: 75 %
Platelets: 312 10*3/uL (ref 150–400)
RBC: 2.52 MIL/uL — ABNORMAL LOW (ref 4.22–5.81)
RDW: 17.1 % — ABNORMAL HIGH (ref 11.5–15.5)
WBC: 8.7 10*3/uL (ref 4.0–10.5)
nRBC: 1.3 % — ABNORMAL HIGH (ref 0.0–0.2)

## 2019-08-29 LAB — GLUCOSE, CAPILLARY
Glucose-Capillary: 115 mg/dL — ABNORMAL HIGH (ref 70–99)
Glucose-Capillary: 116 mg/dL — ABNORMAL HIGH (ref 70–99)
Glucose-Capillary: 123 mg/dL — ABNORMAL HIGH (ref 70–99)
Glucose-Capillary: 92 mg/dL (ref 70–99)
Glucose-Capillary: 114 mg/dL — ABNORMAL HIGH (ref 70–99)

## 2019-08-29 LAB — LIPID PANEL
Cholesterol: 134 mg/dL (ref 0–200)
HDL: 30 mg/dL — ABNORMAL LOW (ref >40)
LDL Cholesterol: 66 mg/dL (ref 0–99)
Total CHOL/HDL Ratio: 4.5 RATIO
Triglycerides: 192 mg/dL — ABNORMAL HIGH (ref <150)
VLDL: 38 mg/dL (ref 0–40)

## 2019-08-29 NOTE — Progress Notes (Signed)
PROGRESS NOTE    [name]  DUK:[id number] DOB: 1951/02/28 DOA: 07/25/2019 PCP: Kathyrn Drown, MD    Brief Narrative:  68 year old with a history of GERD and BPH who presented to [facility], ED with S OB and was found to be hypoxic. He was initially diagnosed with COVID-19 September 20. 1 to 2 days prior to his presentation he developed worsening shortness of breath with anorexia. His wife's physician during a teleconference call noted that the patient himself is very tachypneic and ultimately EMS was sent to the house. EMS found the patient to have saturations in the 40s. In the ED on nonrebreather the patient sats only improved to the 80s. The patient was severely confused and required emergent intubation. Chest x-ray confirmed multifocal infiltrates. CT chest was negative for PE  Assessment & Plan:   Principal Problem:   Pneumonia due to COVID-19 virus Active Problems:   BPH (benign prostatic hyperplasia)   Acute respiratory failure due to COVID-19 (HCC)   AKI (acute kidney injury) (HCC)   Fluid overload   Pneumothorax on right   Acute renal failure (ARF) (HCC)   GI bleed   Atrial fibrillation with RVR (HCC)   Constipation   Gastric ulcer with hemorrhage   Acute respiratory failure (HCC)   Chest tube in place   Primary spontaneous pneumothorax   Acute respiratory distress syndrome (ARDS) due to COVID-19 virus ([place])   Tracheostomy status (HCC)   Cerebral thrombosis with cerebral infarction   Acute Hypoxic Resp. Failure/Pneumonia due to COVID-19 -2 negative COVID-19 test, last on 08/25/2019 -Patient underwent tracheostomy on 10/30, somewhat still vent dependent Intermittent trach collar -Completed course of remdesivir and steroids -Patient also received Actemra and convalescent plasma -Continue pulmonary toilet/trach care per protocol -PCCM on board for assistance with vent management -Noted to have increased respiratory effort this AM. Not candidate for  valve or blocker per PCCM  Recent Labs       Lab Results  Component Value Date   [place] 08/25/2019   [place] NEGATIVE 08/24/2019   [place] Detected (A) 07/22/2019     COVID-19 specific Treatment: Decadron 10/2 > 10/12 Remdesivir 10/2 > 10/6 Actemra 10/2 Convalescent plasma 10/3  Encephalopathy/generalized weakness/deconditioned, acute CVA -Recent MRI performed and reviewed, findings of bilateral infarcts -Appreciate input by Stroke team. Recommendation for ASA, EEG, permissive HTN. Also to start statin when able to -Discussed with Dr. Erlinda Hong today. 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 -CT scan of the chest on 10/21 showed findings consistent with an empyema as well as persistent pneumothorax   -TPA infused via chest tube on 10/23 -Endobronchial blocker removed on 10/25 -Continuing chest tube management per CCM  MSSA bacteremia/septic shock -Currently afebrile, with no leukocytosis -2 out of 2 blood cultures positive on 10/9, repeat BC have been negative, last on 08/20/19 NGTD -Echo done on 08/11/2019 was negative -Concern remains for recurrent bacteremia and possible endocarditis, ID recommended Cefazolin to be continued for 24 days from 10/31 -Currently stable  Pseudomonas and Acinetobacter in BAL specimen -Patient was started on meropenem on 10/23 for 10-day treatment -Completed course of abx on 08/25/19  AKI on CKD stage 3 -Remains anuric, HD dependent -Status post CRRT, last done on 11/1 -Initiated HD on 08/26/2019 -Nephrology following for HD  Acute GI bleed  secondary to gastric ulcer -S/p EGD with clipping of vessel and large ulcer -H.  pylori antibody was noted to be elevated -[facility] had been following -Continue PPI -Patient will need 2 weeks treatment for H. pylori when he is ready for discharge -Anticipate f/u with [facility] gastroenterology when discharged ultimately  Acute blood loss anemia -Likely secondary to above -Hemoglobin stable at new baseline -No signs of overt bleeding -Patient has been transfused with 5 units of PRBC so far during this hospitalization Last transfused with 2 units on 10/28 -Presently tolerating ASA  Elevated D-dimer -No evidence for DVT on Dopplers on 10/8 -Thought to be related to suspected empyema in the right lung -Remains stable currently  New onset paroxysmal atrial fibrillation with RVR -Heart rate controlled, currently sinus rhythm -Atrial fibrillation likely occurred in the setting of acute illness -Unable to fully anticoagulate due to GI bleeding -See above, acute stroke identified. Have consulted Neurology. Appreciate input. Recommendation to continue ASA. Unable to tolerate anticoagulation secondary to hx of GI bleeding  Pressure injuries Pressure Injury 08/16/19 Lip Medial;Lower Stage I -  Intact skin with non-blanchable redness of a localized area usually over a bony prominence. healing  (Active)  08/16/19 2000  Location: Lip  Location Orientation: Medial;Lower  Staging: Stage I -  Intact skin with non-blanchable redness of a localized area usually over a bony prominence.  Wound Description (Comments): healing   Present on Admission: No     Pressure Injury 08/16/19 Lip Lower;Mid Stage I -  Intact skin with non-blanchable redness of a localized area usually over a bony prominence. healing  (Active)  08/16/19 2342  Location: Lip  Location Orientation: Lower;Mid  Staging: Stage I -  Intact skin with non-blanchable redness of a localized area usually over a bony prominence.  Wound Description (Comments): healing   Present on Admission:      Pressure Injury 08/24/19  Buttocks Right;Left;Mid Deep Tissue Injury - Purple or maroon localized area of discolored intact skin or blood-filled blister due to damage of underlying soft tissue from pressure and/or shear. (Active)  08/24/19 0800  Location: Buttocks  Location Orientation: Right;Left;Mid  Staging: Deep Tissue Injury - Purple or maroon localized area of discolored intact skin or blood-filled blister due to damage of underlying soft tissue from pressure and/or shear.  Wound Description (Comments):   Present on Admission: No    DVT prophylaxis: heparin subQ Code Status: DNR Family Communication: Pt in room, wife at bedside Disposition Plan: Uncertain at this time  Consultants:  Pulmonology.  Nephrology.  Gastroenterology., Neurology  Procedures:     Antimicrobials: Anti-infectives (From admission, onward)   Start     Dose/Rate Route Frequency Ordered Stop   08/26/19 2200  ceFAZolin (ANCEF) IVPB 1 g/50 mL premix     1 g 100 mL/hr over 30 Minutes Intravenous Every 24 hours 08/24/19 1021 09/15/19 2359   08/24/19 1100  meropenem (MERREM) 1 g in sodium chloride 0.9 % 100 mL IVPB     1 g 200 mL/hr over 30 Minutes Intravenous Every 24 hours 08/24/19 1021 08/25/19 1156   08/23/19 1100  ceFAZolin (ANCEF) IVPB 2g/100 mL premix  Status:  Discontinued     2 g 200 mL/hr over 30 Minutes Intravenous Every 12 hours 08/23/19 1048 08/24/19 1021   08/21/19 1700  vancomycin (VANCOCIN) IVPB 750 mg/150 ml premix  Status:  Discontinued     750 mg 150 mL/hr over 60 Minutes Intravenous Every 24 hours 08/21/19 0843 08/23/19 1048   08/21/19 1400  vancomycin (VANCOCIN) IVPB 750 mg/150 ml premix  Status:  Discontinued     750 mg 150 mL/hr over 60 Minutes Intravenous Every 24 hours 08/20/19 1146 08/20/19 1218   08/21/19 1000  meropenem (MERREM) 1 g in sodium chloride 0.9 % 100 mL IVPB  Status:  Discontinued     1 g 200 mL/hr over 30 Minutes Intravenous Every 12 hours 08/20/19 1535 08/23/19 1048   08/21/19 0413   meropenem (MERREM) 500 mg in sodium chloride 0.9 % 100 mL IVPB  Status:  Discontinued     500 mg 200 mL/hr over 30 Minutes Intravenous Every 24 hours 08/20/19 1245 08/20/19 1535   08/20/19 1330  vancomycin (VANCOCIN) 1,750 mg in sodium chloride 0.9 % 500 mL IVPB     1,750 mg 250 mL/hr over 120 Minutes Intravenous  Once 08/20/19 1222 08/20/19 1619   08/20/19 1300  vancomycin (VANCOCIN) 1,250 mg in sodium chloride 0.9 % 250 mL IVPB  Status:  Discontinued     1,250 mg 166.7 mL/hr over 90 Minutes Intravenous  Once 08/20/19 1146 08/20/19 1218   08/20/19 1224  vancomycin variable dose per unstable renal function (pharmacist dosing)  Status:  Discontinued      Does not apply See admin instructions 08/20/19 1225 08/21/19 1152   08/15/19 1600  meropenem (MERREM) 1 g in sodium chloride 0.9 % 100 mL IVPB  Status:  Discontinued     1 g 200 mL/hr over 30 Minutes Intravenous Every 12 hours 08/15/19 1543 08/20/19 1245   08/15/19 1000  voriconazole (VFEND) tablet 200 mg  Status:  Discontinued     200 mg Per Tube Every 12 hours 08/14/19 1148 08/15/19 1134   08/14/19 1600  anidulafungin (ERAXIS) 100 mg in sodium chloride 0.9 % 100 mL IVPB  Status:  Discontinued     100 mg 78 mL/hr over 100 Minutes Intravenous Every 24 hours 08/13/19 1645 08/14/19 1148   08/14/19 1300  voriconazole (VFEND) tablet 400 mg     400 mg Per Tube Every 12 hours 08/14/19 1148 08/14/19 2217   08/13/19 1700  anidulafungin (ERAXIS) 200 mg in sodium chloride 0.9 % 200 mL IVPB     200 mg 78 mL/hr over 200 Minutes Intravenous  Once 08/13/19 1645 08/13/19 2030   08/13/19 0200  ceFAZolin (ANCEF) IVPB 2g/100 mL premix  Status:  Discontinued     2 g 200 mL/hr over 30 Minutes Intravenous Every 12 hours 08/11/19 1512 08/15/19 1543   08/12/19 0200  ceFEPIme (MAXIPIME) 2 g in sodium chloride 0.9 % 100 mL IVPB     2 g 200 mL/hr over 30 Minutes Intravenous Every 12 hours 08/11/19 1509 08/12/19 1341   08/07/19 2200  ceFEPIme (MAXIPIME) 2 g in  sodium chloride 0.9 % 100 mL IVPB  Status:  Discontinued     2 g 200 mL/hr over 30 Minutes Intravenous Every 12 hours 08/07/19 1540 08/11/19 1509   08/07/19 1000  anidulafungin (ERAXIS) 100 mg in sodium chloride 0.9 % 100 mL IVPB  Status:  Discontinued     100 mg 78 mL/hr over 100 Minutes Intravenous Every 24 hours 08/06/19 0934 08/08/19 1203   08/07/19 0800  ceFEPIme (MAXIPIME) 2 g in sodium chloride 0.9 % 100 mL IVPB  Status:  Discontinued     2 g 200 mL/hr over 30 Minutes Intravenous Every 24 hours 08/06/19 1036 08/07/19 1540   08/06/19 1000  anidulafungin (ERAXIS) 200 mg in sodium chloride 0.9 % 200 mL IVPB    Note to  Pharmacy: please   200 mg 78 mL/hr over 200 Minutes Intravenous Every 24 hours 08/06/19 0925 08/06/19 1600   08/06/19 0800  ceFEPIme (MAXIPIME) 2 g in sodium chloride 0.9 % 100 mL IVPB  Status:  Discontinued     2 g 200 mL/hr over 30 Minutes Intravenous Every 12 hours 08/06/19 0733 08/06/19 1036   08/02/19 2200  ceFAZolin (ANCEF) IVPB 2g/100 mL premix  Status:  Discontinued     2 g 200 mL/hr over 30 Minutes Intravenous Every 8 hours 08/02/19 1338 08/06/19 0733   08/02/19 1400  vancomycin (VANCOCIN) 1,250 mg in sodium chloride 0.9 % 250 mL IVPB  Status:  Discontinued     1,250 mg 166.7 mL/hr over 90 Minutes Intravenous Every 24 hours 08/01/19 1320 08/02/19 1332   08/01/19 1330  vancomycin (VANCOCIN) 2,000 mg in sodium chloride 0.9 % 500 mL IVPB     2,000 mg 250 mL/hr over 120 Minutes Intravenous  Once 08/01/19 1158 08/01/19 1636   08/01/19 1300  ceFEPIme (MAXIPIME) 2 g in sodium chloride 0.9 % 100 mL IVPB  Status:  Discontinued     2 g 200 mL/hr over 30 Minutes Intravenous Every 12 hours 08/01/19 1158 08/02/19 1332   07/26/19 1600  remdesivir 100 mg in sodium chloride 0.9 % 250 mL IVPB     100 mg 500 mL/hr over 30 Minutes Intravenous Every 24 hours 07/26/19 0132 07/29/19 1823   07/26/19 0215  remdesivir 200 mg in sodium chloride 0.9 % 250 mL IVPB     200 mg 500 mL/hr  over 30 Minutes Intravenous Once 07/26/19 0132 07/26/19 0520      Subjective: Unable to assess given current mentation  Objective: Vitals:   08/29/19 1000 08/29/19 1100 08/29/19 1133 08/29/19 1200  BP: 124/77 100/71  98/67  Pulse: 92 97 94 87  Resp: (!) 22 (!) _0 Temp:   99.3 F (37.4 C)   TempSrc:   Axillary   SpO2: 97% 97% 99% 100%  Weight:      Height:        Intake/Output Summary (Last 24 hours) at 08/29/2019 1403 Last data filed at 08/29/2019 1200 Gross per 24 hour  Intake 1497.07 ml  Output 1000 ml  Net 497.07 ml   Filed Weights   08/26/19 0700 08/28/19 1245 08/28/19 1625  Weight: 79.8 kg 80.7 kg 79.7 kg    Examination: General exam: awake, in no acute distress Respiratory system: normal chest rise, clear, no audible wheezing Cardiovascular system: regular rhythm, s1-s2 Gastrointestinal system: Nondistended, nontender, pos BS Central nervous system: No seizures, no tremors Extremities: No cyanosis, no joint deformities Skin: No rashes, no pallor Psychiatry:Difficult to assess given current mentation  Data Reviewed: I have personally reviewed following labs and imaging studies  CBC: Recent Labs  Lab 08/24/19 0515 08/25/19 0517 08/26/19 0724 08/27/19 0336 08/28/19 0409 08/29/19 0528  WBC 9.2 10.2 9.8 8.3 7.7 8.7  NEUTROABS 6.9 8.1*  --  6.1 5.7 6.6  HGB 8.7* 8.6* 8.3* 7.8* 7.9* 7.4*  HCT 28.6* 28.0* 27.2* 25.0* 25.4* 24.2*  MCV 95.3 96.2 96.5 94.3 93.4 96.0  PLT 194 246 295 272 310 876   Basic Metabolic Panel: Recent Labs  Lab 08/23/19 0415  08/23/19 1550 08/24/19 0515 08/25/19 0517 08/26/19 0724 08/27/19 0336 08/28/19 0409 08/29/19 0528  NA 136   < > 136 137 138 140 139 139 136  K 4.6   < > 4.5 4.4 4.9 6.2* 4.8 6.2* 5.3*  CL 102  --  104 103 103 105 101 101 99  CO2 26  --  _0 19* 21* 19* 23  GLUCOSE 124*  --  103* 117* 123* 139* 117* 124* 115*  BUN 45*  --  41* 38* 100* 145* 91* 146* 83*  CREATININE 2.01*  --  2.02* 1.60*  4.02* 6.43* 4.44* 6.61* 4.21*  CALCIUM 8.5*  --  8.5* 8.7* 9.5 9.6 9.3 9.4 8.5*  MG 2.5*  --   --  2.6* 2.9*  --   --   --   --   PHOS 3.5  --  3.3 2.9 5.7* 9.5*  --   --   --    < > = values in this interval not displayed.   GFR: Estimated Creatinine Clearance: 17.8 mL/min (A) (by C-G formula based on SCr of 4.21 mg/dL (H)). Liver Function Tests: Recent Labs  Lab 08/22/19 1750 08/23/19 0415 08/23/19 1550 08/24/19 0515 08/26/19 0724  ALBUMIN 2.1* 2.3* 2.4* 2.5* 2.1*   No results for input(s): LIPASE, AMYLASE in the last 168 hours. No results for input(s): AMMONIA in the last 168 hours. Coagulation Profile: No results for input(s): INR, PROTIME in the last 168 hours. Cardiac Enzymes: No results for input(s): CKTOTAL, CKMB, CKMBINDEX, TROPONINI in the last 168 hours. BNP (last 3 results) No results for input(s): PROBNP in the last 8760 hours. HbA1C: No results for input(s): HGBA1C in the last 72 hours. CBG: Recent Labs  Lab 08/28/19 2008 08/28/19 2346 08/29/19 0354 08/29/19 0834 08/29/19 1233  GLUCAP 132* 134* 115* 116* 123*   Lipid Profile: Recent Labs    08/29/19 0528  CHOL 134  HDL 30*  LDLCALC 66  TRIG 192*  CHOLHDL 4.5   Thyroid Function Tests: No results for input(s): TSH, T4TOTAL, FREET4, T3FREE, THYROIDAB in the last 72 hours. Anemia Panel: No results for input(s): VITAMINB12, FOLATE, FERRITIN, TIBC, IRON, RETICCTPCT in the last 72 hours. Sepsis Labs: Recent Labs  Lab 08/25/19 0517  PROCALCITON 5.82    Recent Results (from the past 240 hour(s))  Culture, blood (Routine X 2) w Reflex to ID Panel     Status: None   Collection Time: 08/20/19 12:35 PM   Specimen: BLOOD  Result Value Ref Range Status   Specimen Description   Final    BLOOD LEFT HAND Performed at [facility] [address], [place], [place] [address]    Special Requests   Final    BOTTLES DRAWN AEROBIC AND ANAEROBIC Blood Culture adequate volume Performed at  [facility] [address], [place], [place] [address]    Culture   Final    NO GROWTH 5 DAYS Performed at [facility], [place] [address] [place], [place] [address]    Report Status 08/25/2019 FINAL  Final  Culture, blood (Routine X 2) w Reflex to ID Panel     Status: None   Collection Time: 08/20/19 12:48 PM   Specimen: BLOOD  Result Value Ref Range Status   Specimen Description   Final    BLOOD LEFT ARM Performed at [facility] [address] [place], [place] [address]    Special Requests   Final    BOTTLES DRAWN AEROBIC ONLY Blood Culture adequate volume Performed at [facility] [address], [place], [place] [address]    Culture   Final    NO GROWTH 5 DAYS Performed at [facility], [place] [address], [place], [place] [address]  Report Status 08/25/2019 FINAL  Final  Culture, respiratory     Status: None   Collection Time: 08/20/19 12:54 PM   Specimen: Tracheal Aspirate  Result Value Ref Range Status   Specimen Description   Final    TRACHEAL ASPIRATE Performed at [facility] [address] [place], [place] [address]    Special Requests   Final    NONE Performed at [facility], [place] [address] [place], [place] [address]    Gram Stain   Final    ABUNDANT WBC PRESENT,BOTH PMN AND MONONUCLEAR NO ORGANISMS SEEN Performed at [facility], [place] [address], [place], [place] [address]    Culture RARE PSEUDOMONAS AERUGINOSA  Final   Report Status 08/23/2019 FINAL  Final   Organism ID, Bacteria PSEUDOMONAS AERUGINOSA  Final      Susceptibility   Pseudomonas aeruginosa - MIC*    CEFTAZIDIME 4 SENSITIVE Sensitive     CIPROFLOXACIN <=0.25 SENSITIVE Sensitive     GENTAMICIN <=1 SENSITIVE Sensitive     IMIPENEM 1 SENSITIVE Sensitive     PIP/TAZO 8 SENSITIVE Sensitive     CEFEPIME 4 SENSITIVE Sensitive     * RARE PSEUDOMONAS AERUGINOSA  SARS CORONAVIRUS 2  (TAT 6-24 HRS) Nasopharyngeal Nasopharyngeal Swab     Status: None   Collection Time: 08/24/19  1:55 PM   Specimen: Nasopharyngeal Swab  Result Value Ref Range Status   SARS Coronavirus 2 NEGATIVE NEGATIVE Final    Comment: (NOTE) SARS-CoV-2 target nucleic acids are NOT DETECTED. The SARS-CoV-2 RNA is generally detectable in upper and lower respiratory specimens during the acute phase of infection. Negative results do not preclude SARS-CoV-2 infection, do not rule out co-infections with other pathogens, and should not be used as the sole basis for treatment or other patient management decisions. Negative results must be combined with clinical observations, patient history, and epidemiological information. The expected result is Negative. Fact Sheet for Patients: SugarRoll.be Fact Sheet for Healthcare Providers: https://www.woods-mathews.com/ This test is not yet approved or cleared by the [place] FDA and  has been authorized for detection and/or diagnosis of SARS-CoV-2 by FDA under an Emergency Use Authorization (EUA). This EUA will remain  in effect (meaning this test can be used) for the duration of the COVID-19 declaration under Section 56 4(b)(1) of the Act, 21 U.S.C. section 360bbb-3(b)(1), unless the authorization is terminated or revoked sooner. Performed at [facility], [place] [address], [place], [place] [address]   SARS CORONAVIRUS 2 (TAT 6-24 HRS) Nasopharyngeal Nasopharyngeal Swab     Status: None   Collection Time: 08/25/19 11:37 AM   Specimen: Nasopharyngeal Swab  Result Value Ref Range Status   SARS Coronavirus 2 NEGATIVE NEGATIVE Final    Comment: (NOTE) SARS-CoV-2 target nucleic acids are NOT DETECTED. The SARS-CoV-2 RNA is generally detectable in upper and lower respiratory specimens during the acute phase of infection. Negative results do not preclude SARS-CoV-2 infection, do not rule out co-infections with  other pathogens, and should not be used as the sole basis for treatment or other patient management decisions. Negative results must be combined with clinical observations, patient history, and epidemiological information. The expected result is Negative. Fact Sheet for Patients: SugarRoll.be Fact Sheet for Healthcare Providers: https://www.woods-mathews.com/ This test is not yet approved or cleared by the [place] FDA and  has been authorized for detection and/or diagnosis of SARS-CoV-2 by FDA under an Emergency Use Authorization (EUA). This EUA will remain  in effect (  meaning this test can be used) for the duration of the COVID-19 declaration under Section 56 4(b)(1) of the Act, 21 U.S.C. section 360bbb-3(b)(1), unless the authorization is terminated or revoked sooner. Performed at [facility], [place] [address] [place], [place] [address]      Radiology Studies: No results found.  Scheduled Meds: . aspirin  325 mg Per Tube Daily  . atorvastatin  40 mg Oral q1800  . chlorhexidine  15 mL Mouth/Throat BID  . Chlorhexidine Gluconate Cloth  6 each Topical Daily  . darbepoetin (ARANESP) injection - NON-DIALYSIS  100 mcg Subcutaneous Q Wed-1800  . famotidine  20 mg Per Tube Daily  . feeding supplement (PRO-STAT SUGAR FREE 64)  60 mL Per Tube BID  . Gerhardt's butt cream   Topical TID  . heparin injection (subcutaneous)  7,500 Units Subcutaneous Q8H  . insulin aspart  0-9 Units Subcutaneous Q4H  . mouth rinse  15 mL Mouth Rinse 10 times per day  . sodium zirconium cyclosilicate  10 g Oral Daily  . sucralfate  1 g Oral Q6H   Continuous Infusions: . sodium chloride Stopped (08/21/19 1920)  . sodium chloride 10 mL/hr at 08/25/19 2118  . sodium chloride    . sodium chloride    .  ceFAZolin (ANCEF) IV Stopped (08/29/19 0007)  . dextrose Stopped (08/16/19 1623)  . feeding supplement (VITAL 1.5 CAL) 1,000 mL (08/29/19 0957)  .  phenylephrine (NEO-SYNEPHRINE) Adult infusion 10 mcg/min (08/29/19 1200)     LOS: 35 days   Marylu Lund, MD [facility] Pager On Amion  If 7PM-7AM, please contact night-coverage 08/29/2019, 2:03 PM

## 2019-08-29 NOTE — Progress Notes (Signed)
20 Days Post-Op Procedure(s) (LRB): ESOPHAGOGASTRODUODENOSCOPY (EGD) (N/A) HEMOSTASIS CONTROL HEMOSTASIS CLIP PLACEMENT Subjective: Breathing a little more labored today  Objective: Vital signs in last 24 hours: Temp:  [97.8 F (36.6 C)-99.3 F (37.4 C)] 99.3 F (37.4 C) (11/06 1133) Pulse Rate:  [81-109] 87 (11/06 1200) Cardiac Rhythm: Normal sinus rhythm (11/06 1200) Resp:  [19-29] 19 (11/06 1200) BP: (73-131)/(52-81) 98/67 (11/06 1200) SpO2:  [93 %-100 %] 100 % (11/06 1200) FiO2 (%):  [28 %] 28 % (11/06 1133) Weight:  [79.7 kg] 79.7 kg (11/05 1625)  Hemodynamic parameters for last 24 hours:    Intake/Output from previous day: 11/05 0701 - 11/06 0700 In: 1235.3 [I.V.:525.4; NG/GT:660; IV Piggyback:49.9] Out: 1000  Intake/Output this shift: Total I/O In: 261.7 [I.V.:151.7; NG/GT:110] Out: -   Neurologic: nods and shakes head to questions  LUNGS- rhonchi bilaterally + air leak  Lab Results: Recent Labs    08/28/19 0409 08/29/19 0528  WBC 7.7 8.7  HGB 7.9* 7.4*  HCT 25.4* 24.2*  PLT 310 312   BMET:  Recent Labs    08/28/19 0409 08/29/19 0528  NA 139 136  K 6.2* 5.3*  CL 101 99  CO2 19* 23  GLUCOSE 124* 115*  BUN 146* 83*  CREATININE 6.61* 4.21*  CALCIUM 9.4 8.5*    PT/INR: No results for input(s): LABPROT, INR in the last 72 hours. ABG    Component Value Date/Time   PHART 7.401 08/23/2019 0453   HCO3 25.7 08/23/2019 0453   TCO2 27 08/23/2019 0453   ACIDBASEDEF 2.0 08/14/2019 0515   O2SAT 93.0 08/23/2019 0453   CBG (last 3)  Recent Labs    08/29/19 0354 08/29/19 0834 08/29/19 1233  GLUCAP 115* 116* 123*    Assessment/Plan: S/P Procedure(s) (LRB): ESOPHAGOGASTRODUODENOSCOPY (EGD) (N/A) HEMOSTASIS CONTROL HEMOSTASIS CLIP PLACEMENT -  Air leak unchanged today. His work of breathing seems higher today, but remains on TC Afebrile and WBC normal  Would leave CT to suction for now Repeat CXR in AM   LOS: 35 days    Melrose Nakayama 08/29/2019

## 2019-08-29 NOTE — Progress Notes (Addendum)
NAME:  [name], MRN:  [id number], DOB:  Apr 13, 1951, LOS: 3 ADMISSION DATE:  07/25/2019, CONSULTATION DATE:  10/3 REFERRING MD:  Nadara Mustard, CHIEF COMPLAINT:  Dyspnea   Brief History   68 y/o male diagnosed with COVID on 9/29 presented to the [facility] ED on 10/2 with dyspnea, hypoxemia requiring intubation.  Admitted to [facility], developed AKI requiring CRRT, pneumothorax requiring chest tube and had a large bronchopleural fistula treated for 10 days with a bronchial blocker.  Tracheostomy placed on 10/30.   Past Medical History  GERD  Significant Hospital Events   10/03 Admit to [facility] from [facility] ER, start decadron and remdesivir, given tociluzimab; prone positioning 10/04 convalescent plasma 10/05 stop prone positioning 10/06 convalescent plasma; prone positioning again 10/09 start ABx 10/10 MSSA bacteremia, CVL d/ced; ID consulted; increase OG tube outpt 10/11 vent weaning trial started 10/13 fever, pneumothorax, pig tail chest tube placed 10/14 worsening hypotension and renal fx >> consulted nephrology; worsening PTX >> replaced chest tube; GNR in sputum >> ABx changed 10/15 start CRRT; endobronchial blocker placed for persistent air leak 10/16 endobronchial blocker repositioned, changed chest tube to water seal; melana with ABLA >> GI consulted; transfuse PRBC 10/17 persistent air leak, increased WOB; start nimbex gtt >> air leak decreased; EGD; A fib with RVR >> start amiodarone 10/18 transfuse PRBC; GI s/o 10/20 resume heparin gtt 10/21 stopped nimbex, chest tube to suction, stopped heparin drip because Hgb dropped again 10/22 TPA in chest tube, repositioned endobronchial blocker 10/23 deflated endobronchial balloon 10/24 small air leak  10/25 removed bronchial blocker 10/30 tracheostomy  11/1 D/C CRRT 11/2 Transfer to [facility] 11/3 Started on IHD  Consults:  ID Nephrology Gastroenterology PCCM  Procedures:   10/2 R IJ CVL > 10/10 10/13 Rt pig tail chest tube >> 10/14 10/14 R  70F chest tube > 10/14 L IJ HD cath > 10/30 tracheostomy >   Significant Diagnostic Tests:  10/2 CT angiogram chest > extensive bilateral airspace disease predominantly posterior 10/8 doppler legs b/l > no DVT 10/15 renal u/s > normal 10/17 EGD > non bleeding gastric ulcer 10/19 Echo >> EF 60 to 65%, hyperdynamic LV with small LVOT gradient 10/22 CT chest > large collection of air in pleural space with fluid, pneumonia bilaterally R>L endobronchial blocker in place, chest tube anterior 10/31 CXR > persistent small right pneumothorax despite chest in adequate position MRI brain 11/3 > small acute to subacute infarcts involving bilateral cerebral cortex and right cerebellum.  Large b/l mastoid effusions  Micro Data:  9/20 SARS COV 2 > POSITIVE 10/2 blood > negative 10/9 blood > MSSA 2/4 10/9 resp > MSSA, Pseudomonas 10/13 blood >> negative 10/21 bronch wash bacterial > pseduomonas, acinetobacter baumannii 10/21 bronch wash fungal >  10/21 bronch wash aspergillus antigen> negative 10/24 bronchoscopy wash> pseudomonas 10/28 Blood cx>>> 10/28 Sputum cx>>> Pseudomonas 11/1 SARS COV 2 > negative  Antimicrobials:  10/3 remdesivir > 10/6 10/3 actemra 10/3 decadron > 10/12 10/4 convalescent plasma 10/6 convalescent plasma  10/9 vancomycin > 10/10 10/9 cefepime > 10/10 10/10 ancef > 10/13 10/13 cefepime >10/20 10/14 anidulofungin > 10/15  10/20 ancef > 10/23 10/23 meropenem > >>>off  10/21 anidulofungin> 10/22 10/22 voriconazole > 10/23 Merrem 10/28>>>11/2 Vanc 10/28>>>10/30 Ancef 10/31>> 11/3 11/3 ancef>> Interim history/subjective:  Remains off ventilator  Objective   Blood pressure 124/79, pulse 94, temperature 97.8 F (36.6 C), temperature source Oral, resp. rate (!) 23, height 5\' 10"  (1.778 m), weight 79.7 kg, SpO2 94 %.  Intake/Output Summary (Last 24 hours) at 08/29/2019 0815 Last data filed at 08/29/2019 0600 Gross per 24 hour  Intake 1127.76 ml   Output 1000 ml  Net 127.76 ml   Filed Weights   08/26/19 0700 08/28/19 1245 08/28/19 1625  Weight: 79.8 kg 80.7 kg 79.7 kg    Examination: General: Frail elderly male appears much older than stated age 71: Tracheostomy in place with some bloody secretions Neuro: Poorly responsive does not follow commands. CV: Heart sounds are regular PULM: Coarse rhonchi bilaterally, coughing up blood-streaked sputum GI: soft, bsx4 active  Extremities: warm/dry, 1+ edema muscular atrophy noted Skin: no rashes or lesions   Assessment & Plan:   Acute respiratory failure with hypoxemia - 2/2 COVID-19 (s/p remdsevir, actemra, convalescent plasma, steroids).  Trach dependence - trach placed 10/30 Continue trach collar as tolerated Noted to have difficulty clearing secretions Being off positive pressure with improved air leak  R pneumothorax - s/p chest tube placement.  Now with persistent airleak / BPF despite endobronchial blocker placement (removed 10/25 due to MSSA bacteremia).  Chest tube for CVTS.  AKI: no evidence of recovery Lab Results  Component Value Date   CREATININE 4.21 (H) 08/29/2019   CREATININE 6.61 (H) 08/28/2019   CREATININE 4.44 (H) 08/27/2019   CREATININE 1.15 11/20/2014    HD per nephrology  Neuromuscular weakness/encephalopathy PT OT consult per primary  MSSA bacteremia - Echocardiogram negative.  Per ID  Bilateral cerebral cortex and right cerebellum small acute to subacute infarcts. Per primary team and neurology   Rest per primary team.   Best practice:  Diet: tube feeding Pain/Anxiety/Delirium protocol (if indicated): fentanyl for pain. VAP protocol (if indicated): yes DVT prophylaxis: sub cutaneous heparin GI prophylaxis: Pantoprazole for stress ulcer prophylaxis Glucose control: SSI Mobility: PT  Code Status: DNR if arrests Family Communication:  Disposition: ICU transfer to progressive if remains off presors   [place]  PCCM  Attending Note:  68 year old male with COVID ARDS now with a tracheostomy in place.  Overnight, hypotensive requiring neo support for SBP of 100-140 per neuro recommendations.  On exam, struggling to breath this AM on TC.  I reviewed CXR myself, trach is in a good position and diffuse infiltrate noted.  Discussed with PCCM-NP.  Will continue TC for now, may require vent support.  CT to suction.  Not a candidate for valve or blocker at this point.  Continue neo for BP support.  PCCM will continue to manage.  The patient is critically ill with multiple organ systems failure and requires high complexity decision making for assessment and support, frequent evaluation and titration of therapies, application of advanced monitoring technologies and extensive interpretation of multiple databases.   Critical Care Time devoted to patient care services described in this note is  33  Minutes. This time reflects time of care of this signee Dr Jennet Maduro. This critical care time does not reflect procedure time, or teaching time or supervisory time of PA/NP/Med student/Med Resident etc but could involve care discussion time.  Rush Farmer, M.D. [facility] Pulmonary/Critical Care Medicine.

## 2019-08-29 NOTE — Progress Notes (Signed)
Patient ID: [name], male   DOB: 1950/10/28, 68 y.o.   MRN: [id number] S: remains on pressors but tolerated 1 liter UF with HD yesterday O:BP 124/77   Pulse 92   Temp 98.8 F (37.1 C) (Axillary)   Resp (!) 22   Ht 5\' 10"  (1.778 m)   Wt 79.7 kg   SpO2 97%   BMI 25.21 kg/m   Intake/Output Summary (Last 24 hours) at 08/29/2019 1107 Last data filed at 08/29/2019 1000 Gross per 24 hour  Intake 1467.04 ml  Output 1000 ml  Net 467.04 ml   Intake/Output: I/O last 3 completed shifts: In: 1890.3 [I.V.:525.4; NG/GT:1265; IV Piggyback:99.9] Out: 1250 [Other:1000; Stool:250]  Intake/Output this shift:  Total I/O In: 231.7 [I.V.:121.7; NG/GT:110] Out: -  Weight change:  Gen: frail, ill-appearing AAM lying in bed, lethargic but arousable CVS: tachy  Resp:scattered rhonchi and transmitted upper airway sounds Abd: +BS, soft, NT/ND Ext: trace-1+ edema  Recent Labs  Lab 08/22/19 1750 08/23/19 0415  08/23/19 1550 08/24/19 0515 08/25/19 0517 08/26/19 0724 08/27/19 0336 08/28/19 0409 08/29/19 0528  NA 138 136   < > 136 137 138 140 139 139 136  K 5.9* 4.6   < > 4.5 4.4 4.9 6.2* 4.8 6.2* 5.3*  CL 103 102  --  104 103 103 105 101 101 99  CO2 25 26  --  25 26 22  19* 21* 19* 23  GLUCOSE 107* 124*  --  103* 117* 123* 139* 117* 124* 115*  BUN 56* 45*  --  41* 38* 100* 145* 91* 146* 83*  CREATININE 2.92* 2.01*  --  2.02* 1.60* 4.02* 6.43* 4.44* 6.61* 4.21*  ALBUMIN 2.1* 2.3*  --  2.4* 2.5*  --  2.1*  --   --   --   CALCIUM 8.8* 8.5*  --  8.5* 8.7* 9.5 9.6 9.3 9.4 8.5*  PHOS 7.1* 3.5  --  3.3 2.9 5.7* 9.5*  --   --   --    < > = values in this interval not displayed.   Liver Function Tests: Recent Labs  Lab 08/23/19 1550 08/24/19 0515 08/26/19 0724  ALBUMIN 2.4* 2.5* 2.1*   No results for input(s): LIPASE, AMYLASE in the last 168 hours. No results for input(s): AMMONIA in the last 168 hours. CBC: Recent Labs  Lab 08/25/19 0517 08/26/19 0724 08/27/19 0336 08/28/19 0409  08/29/19 0528  WBC 10.2 9.8 8.3 7.7 8.7  NEUTROABS 8.1*  --  6.1 5.7 6.6  HGB 8.6* 8.3* 7.8* 7.9* 7.4*  HCT 28.0* 27.2* 25.0* 25.4* 24.2*  MCV 96.2 96.5 94.3 93.4 96.0  PLT 246 295 272 310 312   Cardiac Enzymes: No results for input(s): CKTOTAL, CKMB, CKMBINDEX, TROPONINI in the last 168 hours. CBG: Recent Labs  Lab 08/28/19 1524 08/28/19 2008 08/28/19 2346 08/29/19 0354 08/29/19 0834  GLUCAP 121* 132* 134* 115* 116*    Iron Studies: No results for input(s): IRON, TIBC, TRANSFERRIN, FERRITIN in the last 72 hours. Studies/Results: No results found. Marland Kitchen aspirin  325 mg Per Tube Daily  . atorvastatin  40 mg Oral q1800  . chlorhexidine  15 mL Mouth/Throat BID  . Chlorhexidine Gluconate Cloth  6 each Topical Daily  . darbepoetin (ARANESP) injection - NON-DIALYSIS  100 mcg Subcutaneous Q Wed-1800  . famotidine  20 mg Per Tube Daily  . feeding supplement (PRO-STAT SUGAR FREE 64)  60 mL Per Tube BID  . Gerhardt's butt cream   Topical TID  . heparin injection (subcutaneous)  7,500 Units Subcutaneous Q8H  . insulin aspart  0-9 Units Subcutaneous Q4H  . mouth rinse  15 mL Mouth Rinse 10 times per day  . sodium zirconium cyclosilicate  10 g Oral Daily  . sucralfate  1 g Oral Q6H    BMET    Component Value Date/Time   NA 136 08/29/2019 0528   NA 143 11/08/2017 1030   K 5.3 (H) 08/29/2019 0528   CL 99 08/29/2019 0528   CO2 23 08/29/2019 0528   GLUCOSE 115 (H) 08/29/2019 0528   BUN 83 (H) 08/29/2019 0528   BUN 8 11/08/2017 1030   CREATININE 4.21 (H) 08/29/2019 0528   CREATININE 1.15 11/20/2014 1224   CALCIUM 8.5 (L) 08/29/2019 0528   GFRNONAA 14 (L) 08/29/2019 0528   GFRAA 16 (L) 08/29/2019 0528   CBC    Component Value Date/Time   WBC 8.7 08/29/2019 0528   RBC 2.52 (L) 08/29/2019 0528   HGB 7.4 (L) 08/29/2019 0528   HGB 15.8 11/08/2017 1030   HCT 24.2 (L) 08/29/2019 0528   HCT 46.6 11/08/2017 1030   PLT 312 08/29/2019 0528   PLT 202 11/08/2017 1030   MCV 96.0  08/29/2019 0528   MCV 93 11/08/2017 1030   MCH 29.4 08/29/2019 0528   MCHC 30.6 08/29/2019 0528   RDW 17.1 (H) 08/29/2019 0528   RDW 14.2 11/08/2017 1030   LYMPHSABS 0.9 08/29/2019 0528   LYMPHSABS 1.1 11/08/2017 1030   MONOABS 0.6 08/29/2019 0528   EOSABS 0.4 08/29/2019 0528   EOSABS 0.1 11/08/2017 1030   BASOSABS 0.0 08/29/2019 0528   BASOSABS 0.0 11/08/2017 1030    Assessment/Plan:  1. AKI/CKD stage 3- in setting of covid PNA and has remained oliguric/anuric. Started on CVVHD 08/07/19-08/24/19. He is off pressors but remains anuric with rising BUN/Cr. 1. He was transferred to [facility] for [facility]HD 08/25/19 but required pressors 2. Hyperkalemia and markedly elevated BUN likely due to hypercatabolic state and plan for HD again today.  If he cannot tolerate it hemodynamically, will likely have to resume CVVHD. 3. Per neuro, he needs to keep his SBP >120 for perfusion following his recent strokes.  He may be better served with CVVHD if unable to maintain BPs. 2. COVID PNA- treated with remdesivir, dexamethasone, and convalescent plasma. Most recent covid test was negative x 2 on 08/24/19 and 08/25/19. 3. VDRF - s/p trach and on trach collar per PCCM 4. Sepsis/shock- off of pressors 5. Right pneumothorax/emphyema/necrotic RLL- s/p chest tube with air leak. Per CT Surgery he is not a candidate for VATS, however concern that necrotic RLL is source of hypercatabolic state. 6. New cerebral and cerebellar strokes- per primary svc. 7. A fib/RVR- per PCCM 8. GIB- acute s/p EGD which showed gastric ulcer with stigmata of recent bleeding s/p injection and clipping. Able to tolerate low dose heparin with CVVHD before stopping it. 9. MSSA bacteremia- treated 10. Acinetobacter and pseudomonal pna- per PCCM, meropenem for 7 days. [place], MD [facility] [phone]

## 2019-08-30 ENCOUNTER — Inpatient Hospital Stay (HOSPITAL_COMMUNITY): Payer: Medicare HMO

## 2019-08-30 DIAGNOSIS — I959 Hypotension, unspecified: Secondary | ICD-10-CM | POA: Diagnosis not present

## 2019-08-30 DIAGNOSIS — D649 Anemia, unspecified: Secondary | ICD-10-CM | POA: Diagnosis not present

## 2019-08-30 DIAGNOSIS — U071 COVID-19: Secondary | ICD-10-CM | POA: Diagnosis not present

## 2019-08-30 DIAGNOSIS — J9601 Acute respiratory failure with hypoxia: Secondary | ICD-10-CM | POA: Diagnosis not present

## 2019-08-30 DIAGNOSIS — J80 Acute respiratory distress syndrome: Secondary | ICD-10-CM | POA: Diagnosis not present

## 2019-08-30 DIAGNOSIS — J939 Pneumothorax, unspecified: Secondary | ICD-10-CM | POA: Diagnosis not present

## 2019-08-30 DIAGNOSIS — J1289 Other viral pneumonia: Secondary | ICD-10-CM | POA: Diagnosis not present

## 2019-08-30 DIAGNOSIS — Z4682 Encounter for fitting and adjustment of non-vascular catheter: Secondary | ICD-10-CM | POA: Diagnosis not present

## 2019-08-30 DIAGNOSIS — R579 Shock, unspecified: Secondary | ICD-10-CM | POA: Diagnosis not present

## 2019-08-30 DIAGNOSIS — N179 Acute kidney failure, unspecified: Secondary | ICD-10-CM | POA: Diagnosis not present

## 2019-08-30 DIAGNOSIS — E872 Acidosis: Secondary | ICD-10-CM | POA: Diagnosis not present

## 2019-08-30 DIAGNOSIS — A4901 Methicillin susceptible Staphylococcus aureus infection, unspecified site: Secondary | ICD-10-CM | POA: Diagnosis not present

## 2019-08-30 LAB — BASIC METABOLIC PANEL
Anion gap: 17 — ABNORMAL HIGH (ref 5–15)
BUN: 136 mg/dL — ABNORMAL HIGH (ref 8–23)
CO2: 21 mmol/L — ABNORMAL LOW (ref 22–32)
Calcium: 8.8 mg/dL — ABNORMAL LOW (ref 8.9–10.3)
Chloride: 98 mmol/L (ref 98–111)
Creatinine, Ser: 6.09 mg/dL — ABNORMAL HIGH (ref 0.61–1.24)
GFR calc Af Amer: 10 mL/min — ABNORMAL LOW (ref >60)
GFR calc non Af Amer: 9 mL/min — ABNORMAL LOW (ref >60)
Glucose, Bld: 121 mg/dL — ABNORMAL HIGH (ref 70–99)
Potassium: 5.7 mmol/L — ABNORMAL HIGH (ref 3.5–5.1)
Sodium: 136 mmol/L (ref 135–145)

## 2019-08-30 LAB — GLUCOSE, CAPILLARY
Glucose-Capillary: 104 mg/dL — ABNORMAL HIGH (ref 70–99)
Glucose-Capillary: 108 mg/dL — ABNORMAL HIGH (ref 70–99)
Glucose-Capillary: 110 mg/dL — ABNORMAL HIGH (ref 70–99)
Glucose-Capillary: 86 mg/dL (ref 70–99)
Glucose-Capillary: 89 mg/dL (ref 70–99)
Glucose-Capillary: 92 mg/dL (ref 70–99)

## 2019-08-30 LAB — CBC WITH DIFFERENTIAL/PLATELET
Abs Immature Granulocytes: 0.17 10*3/uL — ABNORMAL HIGH (ref 0.00–0.07)
Basophils Absolute: 0 10*3/uL (ref 0.0–0.1)
Basophils Relative: 0 %
Eosinophils Absolute: 0.5 10*3/uL (ref 0.0–0.5)
Eosinophils Relative: 5 %
HCT: 23.3 % — ABNORMAL LOW (ref 39.0–52.0)
Hemoglobin: 7.2 g/dL — ABNORMAL LOW (ref 13.0–17.0)
Immature Granulocytes: 2 %
Lymphocytes Relative: 9 %
Lymphs Abs: 0.8 10*3/uL (ref 0.7–4.0)
MCH: 29.5 pg (ref 26.0–34.0)
MCHC: 30.9 g/dL (ref 30.0–36.0)
MCV: 95.5 fL (ref 80.0–100.0)
Monocytes Absolute: 0.6 10*3/uL (ref 0.1–1.0)
Monocytes Relative: 7 %
Neutro Abs: 6.9 10*3/uL (ref 1.7–7.7)
Neutrophils Relative %: 77 %
Platelets: 361 10*3/uL (ref 150–400)
RBC: 2.44 MIL/uL — ABNORMAL LOW (ref 4.22–5.81)
RDW: 16.5 % — ABNORMAL HIGH (ref 11.5–15.5)
WBC: 9 10*3/uL (ref 4.0–10.5)
nRBC: 0.7 % — ABNORMAL HIGH (ref 0.0–0.2)

## 2019-08-30 LAB — PHOSPHORUS: Phosphorus: 9.7 mg/dL — ABNORMAL HIGH (ref 2.5–4.6)

## 2019-08-30 LAB — MAGNESIUM: Magnesium: 2.9 mg/dL — ABNORMAL HIGH (ref 1.7–2.4)

## 2019-08-30 MED ORDER — HEPARIN SODIUM (PORCINE) 1000 UNIT/ML IJ SOLN
INTRAMUSCULAR | Status: AC
  Filled 2019-08-30: qty 4

## 2019-08-30 MED ORDER — SODIUM BICARBONATE 650 MG PO TABS
650.0000 mg | ORAL_TABLET | Freq: Once | ORAL | Status: AC
  Administered 2019-08-30: 650 mg
  Filled 2019-08-30: qty 1

## 2019-08-30 MED ORDER — HEPARIN SODIUM (PORCINE) 5000 UNIT/ML IJ SOLN
5000.0000 [IU] | Freq: Three times a day (TID) | INTRAMUSCULAR | Status: DC
  Administered 2019-08-31 (×3): 5000 [IU] via SUBCUTANEOUS
  Filled 2019-08-30 (×4): qty 1

## 2019-08-30 MED ORDER — PANCRELIPASE (LIP-PROT-AMYL) 10440-39150 UNITS PO TABS
20880.0000 [IU] | ORAL_TABLET | Freq: Once | ORAL | Status: AC
  Administered 2019-08-30: 20880 [IU]
  Filled 2019-08-30: qty 2

## 2019-08-30 MED ORDER — MIDODRINE HCL 5 MG PO TABS: 10.0000 mg | ORAL_TABLET | Freq: Three times a day (TID) | ORAL | Status: DC | PRN

## 2019-08-30 MED ORDER — ALBUMIN HUMAN 25 % IV SOLN
INTRAVENOUS | Status: AC
  Administered 2019-08-30: 12.5 g
  Filled 2019-08-30: qty 50

## 2019-08-30 NOTE — Patient Care Conference (Addendum)
Called and updated patient's wife over phone. Family is now aware that patient is pressor dependent to maintain appropriate BP on HD. All questions were answered. Of note, patient's wife did ask about possibility for renal transplant. Family is agreeable to meet with Palliative Care to discuss goals of care. Will consult Palliative Care

## 2019-08-30 NOTE — Progress Notes (Signed)
      [address]       [place] [address]             [phone]      On HD currently  BP 120/68   Pulse 80   Temp (!) 97.5 F (36.4 C) (Oral)   Resp 19   Ht 5\' 10"  (1.778 m)   Wt 79.9 kg   SpO2 100%   BMI 25.27 kg/m  Air leak and CXR unchanged  Persistent air leak. Currently is well down the list of his most pressing issues but is unliekly to change without intervention. May try water seal again tomorrow If persists ultimately we'll probably need to try IBV despite concerns about worsening pneumonia  [name] C. Roxan Hockey, MD [facility] [phone]

## 2019-08-30 NOTE — Progress Notes (Signed)
PROGRESS NOTE    [name]  UXN:[id number] DOB: [birth year] DOA: 07/25/2019 PCP: Kathyrn Drown, MD    Brief Narrative:  68 year old with a history of GERD and BPH who presented to [facility], ED with S OB and was found to be hypoxic. He was initially diagnosed with COVID-19 September 20. 1 to 2 days prior to his presentation he developed worsening shortness of breath with anorexia. His wife's physician during a teleconference call noted that the patient himself is very tachypneic and ultimately EMS was sent to the house. EMS found the patient to have saturations in the 40s. In the ED on nonrebreather the patient sats only improved to the 80s. The patient was severely confused and required emergent intubation. Chest x-ray confirmed multifocal infiltrates. CT chest was negative for PE  Assessment & Plan:   Principal Problem:   Pneumonia due to COVID-19 virus Active Problems:   BPH (benign prostatic hyperplasia)   Acute respiratory failure due to COVID-19 (HCC)   AKI (acute kidney injury) (HCC)   Fluid overload   Pneumothorax on right   Acute renal failure (ARF) (HCC)   GI bleed   Atrial fibrillation with RVR (HCC)   Constipation   Gastric ulcer with hemorrhage   Acute respiratory failure (HCC)   Chest tube in place   Primary spontaneous pneumothorax   Acute respiratory distress syndrome (ARDS) due to COVID-19 virus ([place])   Tracheostomy status (HCC)   Cerebral thrombosis with cerebral infarction   Acute Hypoxic Resp. Failure/Pneumonia due to COVID-19 -2 negative COVID-19 test, last on 08/25/2019 -Patient underwent tracheostomy on 10/30, somewhat still vent dependent Intermittent trach collar -Completed course of remdesivir and steroids -Patient also received Actemra and convalescent plasma -Continue pulmonary toilet/trach care per protocol -PCCM on board for assistance with vent management -Seems to have stable respiratory effort this AM. Not candidate for valve  or blocker per PCCM  Recent Labs       Lab Results  Component Value Date   SARSCOV2NAA NEGATIVE 08/25/2019   [place] NEGATIVE 08/24/2019   [place] Detected (A) 07/22/2019     COVID-19 specific Treatment: Decadron 10/2 > 10/12 Remdesivir 10/2 > 10/6 Actemra 10/2 Convalescent plasma 10/3  Encephalopathy/generalized weakness/deconditioned, acute CVA -Recent MRI performed and reviewed, findings of bilateral infarcts -Appreciate input by Stroke team. Recommendation for ASA, EEG, permissive HTN. Also to start statin when able to -Discussed with Dr. Erlinda Hong today. 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 -CT scan of the chest on 10/21 showed findings consistent with an empyema as well as persistent pneumothorax  -TPA infused via chest tube on 10/23 -Endobronchial blocker removed on 10/25 -Continuing chest tube management per CCM -Stable at this time  MSSA bacteremia/septic shock -Currently afebrile, with no leukocytosis -2 out of 2 blood cultures positive on 10/9, repeat BC have been negative, last on 08/20/19 NGTD -Echo done on 08/11/2019 was negative -Concern remains for recurrent bacteremia and possible endocarditis, ID recommended Cefazolin to be continued for 24 days from 10/31 -Remains stable  Pseudomonas and Acinetobacter in BAL specimen -Patient was started on meropenem on 10/23 for 10-day treatment -Completed course of abx on 08/25/19  AKI on CKD stage 3 -Remains anuric, HD dependent -Status post CRRT, last done on 11/1 -Initiated HD on 08/26/2019 -Nephrology continues to follow  for HD, currently on pressors to maintain bp in setting of recent stroke  Acute GI bleed secondary  to gastric ulcer -S/p EGD with clipping of vessel and large ulcer -H. pylori antibody was noted to be elevated -[facility] had been following -Continue PPI -Patient will need 2 weeks treatment for H. pylori when he is ready for discharge -Anticipate f/u with [facility] gastroenterology when discharged ultimately  Acute blood loss anemia -Likely secondary to above -Hemoglobin stable at new baseline -No signs of overt bleeding -Patient has been transfused with 5 units of PRBC so far during this hospitalization Last transfused with 2 units on 10/28 -hgb stable thus far, hgb 7.2. Continuing on ASA  Elevated D-dimer -No evidence for DVT on Dopplers on 10/8 -Thought to be related to suspected empyema in the right lung -Currently stable at this time  New onset paroxysmal atrial fibrillation with RVR -Heart rate controlled, currently sinus rhythm -Atrial fibrillation likely occurred in the setting of acute illness -Unable to fully anticoagulate due to GI bleeding -See above, acute stroke identified. Have consulted Neurology. Appreciate input. Recommendation to continue ASA. Unable to tolerate anticoagulation secondary to hx of GI bleeding  Pressure injuries Pressure Injury 08/16/19 Lip Medial;Lower Stage I -  Intact skin with non-blanchable redness of a localized area usually over a bony prominence. healing  (Active)  08/16/19 2000  Location: Lip  Location Orientation: Medial;Lower  Staging: Stage I -  Intact skin with non-blanchable redness of a localized area usually over a bony prominence.  Wound Description (Comments): healing   Present on Admission: No     Pressure Injury 08/16/19 Lip Lower;Mid Stage I -  Intact skin with non-blanchable redness of a localized area usually over a bony prominence. healing  (Active)  08/16/19 2342  Location: Lip  Location Orientation: Lower;Mid  Staging: Stage I -  Intact skin with non-blanchable redness of a localized area usually over a bony  prominence.  Wound Description (Comments): healing   Present on Admission:      Pressure Injury 08/24/19 Buttocks Right;Left;Mid Deep Tissue Injury - Purple or maroon localized area of discolored intact skin or blood-filled blister due to damage of underlying soft tissue from pressure and/or shear. (Active)  08/24/19 0800  Location: Buttocks  Location Orientation: Right;Left;Mid  Staging: Deep Tissue Injury - Purple or maroon localized area of discolored intact skin or blood-filled blister due to damage of underlying soft tissue from pressure and/or shear.  Wound Description (Comments):   Present on Admission: No    DVT prophylaxis: heparin subQ Code Status: DNR Family Communication: Pt in room, wife at bedside Disposition Plan: Uncertain at this time  Consultants:  Pulmonology.  Nephrology.  Gastroenterology., Neurology  Procedures:     Antimicrobials: Anti-infectives (From admission, onward)   Start     Dose/Rate Route Frequency Ordered Stop   08/26/19 2200  ceFAZolin (ANCEF) IVPB 1 g/50 mL premix     1 g 100 mL/hr over 30 Minutes Intravenous Every 24 hours 08/24/19 1021 09/15/19 2359   08/24/19 1100  meropenem (MERREM) 1 g in sodium chloride 0.9 % 100 mL IVPB     1 g 200 mL/hr over 30 Minutes Intravenous Every 24 hours 08/24/19 1021 08/25/19 1156   08/23/19 1100  ceFAZolin (ANCEF) IVPB 2g/100 mL premix  Status:  Discontinued     2 g 200 mL/hr over 30 Minutes Intravenous Every 12 hours 08/23/19 1048 08/24/19 1021   08/21/19 1700  vancomycin (VANCOCIN) IVPB 750 mg/150 ml premix  Status:  Discontinued     750 mg 150 mL/hr over 60 Minutes Intravenous Every 24 hours 08/21/19 0843 08/23/19 1048   08/21/19 1400  vancomycin (VANCOCIN) IVPB 750 mg/150 ml premix  Status:  Discontinued     750 mg 150 mL/hr over 60 Minutes Intravenous Every 24 hours 08/20/19 1146 08/20/19 1218   08/21/19 1000  meropenem (MERREM) 1 g in sodium chloride 0.9 % 100 mL IVPB  Status:  Discontinued     1  g 200 mL/hr over 30 Minutes Intravenous Every 12 hours 08/20/19 1535 08/23/19 1048   08/21/19 0413  meropenem (MERREM) 500 mg in sodium chloride 0.9 % 100 mL IVPB  Status:  Discontinued     500 mg 200 mL/hr over 30 Minutes Intravenous Every 24 hours 08/20/19 1245 08/20/19 1535   08/20/19 1330  vancomycin (VANCOCIN) 1,750 mg in sodium chloride 0.9 % 500 mL IVPB     1,750 mg 250 mL/hr over 120 Minutes Intravenous  Once 08/20/19 1222 08/20/19 1619   08/20/19 1300  vancomycin (VANCOCIN) 1,250 mg in sodium chloride 0.9 % 250 mL IVPB  Status:  Discontinued     1,250 mg 166.7 mL/hr over 90 Minutes Intravenous  Once 08/20/19 1146 08/20/19 1218   08/20/19 1224  vancomycin variable dose per unstable renal function (pharmacist dosing)  Status:  Discontinued      Does not apply See admin instructions 08/20/19 1225 08/21/19 1152   08/15/19 1600  meropenem (MERREM) 1 g in sodium chloride 0.9 % 100 mL IVPB  Status:  Discontinued     1 g 200 mL/hr over 30 Minutes Intravenous Every 12 hours 08/15/19 1543 08/20/19 1245   08/15/19 1000  voriconazole (VFEND) tablet 200 mg  Status:  Discontinued     200 mg Per Tube Every 12 hours 08/14/19 1148 08/15/19 1134   08/14/19 1600  anidulafungin (ERAXIS) 100 mg in sodium chloride 0.9 % 100 mL IVPB  Status:  Discontinued     100 mg 78 mL/hr over 100 Minutes Intravenous Every 24 hours 08/13/19 1645 08/14/19 1148   08/14/19 1300  voriconazole (VFEND) tablet 400 mg     400 mg Per Tube Every 12 hours 08/14/19 1148 08/14/19 2217   08/13/19 1700  anidulafungin (ERAXIS) 200 mg in sodium chloride 0.9 % 200 mL IVPB     200 mg 78 mL/hr over 200 Minutes Intravenous  Once 08/13/19 1645 08/13/19 2030   08/13/19 0200  ceFAZolin (ANCEF) IVPB 2g/100 mL premix  Status:  Discontinued     2 g 200 mL/hr over 30 Minutes Intravenous Every 12 hours 08/11/19 1512 08/15/19 1543   08/12/19 0200  ceFEPIme (MAXIPIME) 2 g in sodium chloride 0.9 % 100 mL IVPB     2 g 200 mL/hr over 30 Minutes  Intravenous Every 12 hours 08/11/19 1509 08/12/19 1341   08/07/19 2200  ceFEPIme (MAXIPIME) 2 g in sodium chloride 0.9 % 100 mL IVPB  Status:  Discontinued     2 g 200 mL/hr over 30 Minutes Intravenous Every 12 hours 08/07/19 1540 08/11/19 1509   08/07/19 1000  anidulafungin (ERAXIS) 100 mg in sodium chloride 0.9 % 100 mL IVPB  Status:  Discontinued     100 mg 78 mL/hr over 100 Minutes Intravenous Every 24 hours 08/06/19 0934 08/08/19 1203   08/07/19 0800  ceFEPIme (MAXIPIME) 2 g in sodium chloride 0.9 % 100 mL IVPB  Status:  Discontinued     2 g 200 mL/hr over 30 Minutes Intravenous Every 24 hours 08/06/19  1036 08/07/19 1540   08/06/19 1000  anidulafungin (ERAXIS) 200 mg in sodium chloride 0.9 % 200 mL IVPB    Note to Pharmacy: please   200 mg 78 mL/hr over 200 Minutes Intravenous Every 24 hours 08/06/19 0925 08/06/19 1600   08/06/19 0800  ceFEPIme (MAXIPIME) 2 g in sodium chloride 0.9 % 100 mL IVPB  Status:  Discontinued     2 g 200 mL/hr over 30 Minutes Intravenous Every 12 hours 08/06/19 0733 08/06/19 1036   08/02/19 2200  ceFAZolin (ANCEF) IVPB 2g/100 mL premix  Status:  Discontinued     2 g 200 mL/hr over 30 Minutes Intravenous Every 8 hours 08/02/19 1338 08/06/19 0733   08/02/19 1400  vancomycin (VANCOCIN) 1,250 mg in sodium chloride 0.9 % 250 mL IVPB  Status:  Discontinued     1,250 mg 166.7 mL/hr over 90 Minutes Intravenous Every 24 hours 08/01/19 1320 08/02/19 1332   08/01/19 1330  vancomycin (VANCOCIN) 2,000 mg in sodium chloride 0.9 % 500 mL IVPB     2,000 mg 250 mL/hr over 120 Minutes Intravenous  Once 08/01/19 1158 08/01/19 1636   08/01/19 1300  ceFEPIme (MAXIPIME) 2 g in sodium chloride 0.9 % 100 mL IVPB  Status:  Discontinued     2 g 200 mL/hr over 30 Minutes Intravenous Every 12 hours 08/01/19 1158 08/02/19 1332   07/26/19 1600  remdesivir 100 mg in sodium chloride 0.9 % 250 mL IVPB     100 mg 500 mL/hr over 30 Minutes Intravenous Every 24 hours 07/26/19 0132 07/29/19  1823   07/26/19 0215  remdesivir 200 mg in sodium chloride 0.9 % 250 mL IVPB     200 mg 500 mL/hr over 30 Minutes Intravenous Once 07/26/19 0132 07/26/19 0520      Subjective: Unable to determine given current mentation  Objective: Vitals:   08/30/19 0900 08/30/19 0930 08/30/19 1000 08/30/19 1030  BP: 122/63 120/72 120/71 126/68  Pulse: 89 83 84 88  Resp: 19 (!) _0 Temp:      TempSrc:      SpO2: 99% 100% 99% 100%  Weight:      Height:        Intake/Output Summary (Last 24 hours) at 08/30/2019 1156 Last data filed at 08/30/2019 1021 Gross per 24 hour  Intake 2612.63 ml  Output 60 ml  Net 2552.63 ml   Filed Weights   08/26/19 0700 08/28/19 1245 08/28/19 1625  Weight: 79.8 kg 80.7 kg 79.7 kg    Examination: General exam: Awake, laying in bed, in nad Respiratory system: Normal respiratory effort, no wheezing Cardiovascular system: regular rate, s1, s2 Gastrointestinal system: Soft, nondistended, positive BS Central nervous system: CN2-12 grossly intact, strength intact Extremities: Perfused, no clubbing Skin: Normal skin turgor, no notable skin lesions seen Psychiatry: unable to determine given current mentation  Data Reviewed: I have personally reviewed following labs and imaging studies  CBC: Recent Labs  Lab 08/25/19 0517 08/26/19 0724 08/27/19 0336 08/28/19 0409 08/29/19 0528 08/30/19 0553  WBC 10.2 9.8 8.3 7.7 8.7 9.0  NEUTROABS 8.1*  --  6.1 5.7 6.6 6.9  HGB 8.6* 8.3* 7.8* 7.9* 7.4* 7.2*  HCT 28.0* 27.2* 25.0* 25.4* 24.2* 23.3*  MCV 96.2 96.5 94.3 93.4 96.0 95.5  PLT 246 295 272 310 312 494   Basic Metabolic Panel: Recent Labs  Lab 08/23/19 1550 08/24/19 0515 08/25/19 0517 08/26/19 0724 08/27/19 0336 08/28/19 0409 08/29/19 0528 08/30/19 0553  NA 136 137 138 140 139  139 136 136  K 4.5 4.4 4.9 6.2* 4.8 6.2* 5.3* 5.7*  CL 104 103 103 105 101 101 99 98  CO2 _0 19* 21* 19* 23 21*  GLUCOSE 103* 117* 123* 139* 117* 124* 115* 121*   BUN 41* 38* 100* 145* 91* 146* 83* 136*  CREATININE 2.02* 1.60* 4.02* 6.43* 4.44* 6.61* 4.21* 6.09*  CALCIUM 8.5* 8.7* 9.5 9.6 9.3 9.4 8.5* 8.8*  MG  --  2.6* 2.9*  --   --   --   --  2.9*  PHOS 3.3 2.9 5.7* 9.5*  --   --   --  9.7*   GFR: Estimated Creatinine Clearance: 12.3 mL/min (A) (by C-G formula based on SCr of 6.09 mg/dL (H)). Liver Function Tests: Recent Labs  Lab 08/23/19 1550 08/24/19 0515 08/26/19 0724  ALBUMIN 2.4* 2.5* 2.1*   No results for input(s): LIPASE, AMYLASE in the last 168 hours. No results for input(s): AMMONIA in the last 168 hours. Coagulation Profile: No results for input(s): INR, PROTIME in the last 168 hours. Cardiac Enzymes: No results for input(s): CKTOTAL, CKMB, CKMBINDEX, TROPONINI in the last 168 hours. BNP (last 3 results) No results for input(s): PROBNP in the last 8760 hours. HbA1C: No results for input(s): HGBA1C in the last 72 hours. CBG: Recent Labs  Lab 08/29/19 1557 08/29/19 2053 08/30/19 0022 08/30/19 0355 08/30/19 0841  GLUCAP 92 114* 108* 110* 104*   Lipid Profile: Recent Labs    08/29/19 0528  CHOL 134  HDL 30*  LDLCALC 66  TRIG 192*  CHOLHDL 4.5   Thyroid Function Tests: No results for input(s): TSH, T4TOTAL, FREET4, T3FREE, THYROIDAB in the last 72 hours. Anemia Panel: No results for input(s): VITAMINB12, FOLATE, FERRITIN, TIBC, IRON, RETICCTPCT in the last 72 hours. Sepsis Labs: Recent Labs  Lab 08/25/19 0517  PROCALCITON 5.82    Recent Results (from the past 240 hour(s))  Culture, blood (Routine X 2) w Reflex to ID Panel     Status: None   Collection Time: 08/20/19 12:35 PM   Specimen: BLOOD  Result Value Ref Range Status   Specimen Description   Final    BLOOD LEFT HAND Performed at [facility] [address], [place], [place] [address]    Special Requests   Final    BOTTLES DRAWN AEROBIC AND ANAEROBIC Blood Culture adequate volume Performed at [facility] [address], [place], [place] [address]    Culture   Final    NO GROWTH 5 DAYS Performed at [facility], [place] [address], [place], [place] [address]    Report Status 08/25/2019 FINAL  Final  Culture, blood (Routine X 2) w Reflex to ID Panel     Status: None   Collection Time: 08/20/19 12:48 PM   Specimen: BLOOD  Result Value Ref Range Status   Specimen Description   Final    BLOOD LEFT ARM Performed at [facility] [address], [place], [place] [address]    Special Requests   Final    BOTTLES DRAWN AEROBIC ONLY Blood Culture adequate volume Performed at [facility] [address] [place],  [address]    Culture   Final    NO GROWTH 5 DAYS Performed at [facility], [place] [address], [place],  [address]    Report Status 08/25/2019 FINAL  Final  Culture, respiratory     Status: None   Collection Time: 08/20/19  12:54 PM   Specimen: Tracheal Aspirate  Result Value Ref Range Status   Specimen Description   Final    TRACHEAL ASPIRATE Performed at [facility] [address] [place], [place] [address]    Special Requests   Final    NONE Performed at [facility], [place] [address] [place], [place] [address]    Gram Stain   Final    ABUNDANT WBC PRESENT,BOTH PMN AND MONONUCLEAR NO ORGANISMS SEEN Performed at [facility], [place] [address] [place], [place] [address]    Culture RARE PSEUDOMONAS AERUGINOSA  Final   Report Status 08/23/2019 FINAL  Final   Organism ID, Bacteria PSEUDOMONAS AERUGINOSA  Final      Susceptibility   Pseudomonas aeruginosa - MIC*    CEFTAZIDIME 4 SENSITIVE Sensitive     CIPROFLOXACIN <=0.25 SENSITIVE Sensitive     GENTAMICIN <=1 SENSITIVE Sensitive     IMIPENEM 1 SENSITIVE Sensitive     PIP/TAZO 8 SENSITIVE Sensitive     CEFEPIME 4 SENSITIVE Sensitive     * RARE PSEUDOMONAS AERUGINOSA  SARS CORONAVIRUS 2 (TAT 6-24 HRS) Nasopharyngeal  Nasopharyngeal Swab     Status: None   Collection Time: 08/24/19  1:55 PM   Specimen: Nasopharyngeal Swab  Result Value Ref Range Status   SARS Coronavirus 2 NEGATIVE NEGATIVE Final    Comment: (NOTE) SARS-CoV-2 target nucleic acids are NOT DETECTED. The SARS-CoV-2 RNA is generally detectable in upper and lower respiratory specimens during the acute phase of infection. Negative results do not preclude SARS-CoV-2 infection, do not rule out co-infections with other pathogens, and should not be used as the sole basis for treatment or other patient management decisions. Negative results must be combined with clinical observations, patient history, and epidemiological information. The expected result is Negative. Fact Sheet for Patients: SugarRoll.be Fact Sheet for Healthcare Providers: https://www.woods-mathews.com/ This test is not yet approved or cleared by the [place] FDA and  has been authorized for detection and/or diagnosis of SARS-CoV-2 by FDA under an Emergency Use Authorization (EUA). This EUA will remain  in effect (meaning this test can be used) for the duration of the COVID-19 declaration under Section 56 4(b)(1) of the Act, 21 U.S.C. section 360bbb-3(b)(1), unless the authorization is terminated or revoked sooner. Performed at [facility], [place] [address] [place], [place] [address]   SARS CORONAVIRUS 2 (TAT 6-24 HRS) Nasopharyngeal Nasopharyngeal Swab     Status: None   Collection Time: 08/25/19 11:37 AM   Specimen: Nasopharyngeal Swab  Result Value Ref Range Status   SARS Coronavirus 2 NEGATIVE NEGATIVE Final    Comment: (NOTE) SARS-CoV-2 target nucleic acids are NOT DETECTED. The SARS-CoV-2 RNA is generally detectable in upper and lower respiratory specimens during the acute phase of infection. Negative results do not preclude SARS-CoV-2 infection, do not rule out co-infections with other pathogens, and should not  be used as the sole basis for treatment or other patient management decisions. Negative results must be combined with clinical observations, patient history, and epidemiological information. The expected result is Negative. Fact Sheet for Patients: SugarRoll.be Fact Sheet for Healthcare Providers: https://www.woods-mathews.com/ This test is not yet approved or cleared by the [place] FDA and  has been authorized for detection and/or diagnosis of SARS-CoV-2 by FDA under an Emergency Use Authorization (EUA). This EUA will remain  in effect (meaning this test can be used) for the duration of the COVID-19 declaration under Section 56 4(b)(1) of the Act,  21 U.S.C. section 360bbb-3(b)(1), unless the authorization is terminated or revoked sooner. Performed at [facility], [place] [address], [place], [place] [address]      Radiology Studies: Dg Chest Port 1 View  Result Date: 08/30/2019 CLINICAL DATA:  Pneumothorax on right EXAM: PORTABLE CHEST 1 VIEW COMPARISON:  Chest radiograph 08/26/2019, 08/23/2019 FINDINGS: Stable cardiomediastinal contours. Unchanged for apparatus. Persistent small right basilar pneumothorax with chest tube in place. Persistent infiltrates/atelectasis in the right mid upper lung and left base. No large pleural effusion. No acute finding in the visualized skeleton. IMPRESSION: Unchanged chest radiograph with persistent small right basilar pneumothorax and atelectasis/infiltrates in the right mid to upper lung as well as left base. Electronically Signed   By: Audie Pinto M.D.   On: 08/30/2019 09:42    Scheduled Meds: . aspirin  325 mg Per Tube Daily  . atorvastatin  40 mg Oral q1800  . chlorhexidine  15 mL Mouth/Throat BID  . Chlorhexidine Gluconate Cloth  6 each Topical Daily  . darbepoetin (ARANESP) injection - NON-DIALYSIS  100 mcg Subcutaneous Q Wed-1800  . famotidine  20 mg Per Tube Daily  . feeding  supplement (PRO-STAT SUGAR FREE 64)  60 mL Per Tube BID  . Gerhardt's butt cream   Topical TID  . heparin      . heparin injection (subcutaneous)  7,500 Units Subcutaneous Q8H  . insulin aspart  0-9 Units Subcutaneous Q4H  . mouth rinse  15 mL Mouth Rinse 10 times per day  . sodium zirconium cyclosilicate  10 g Oral Daily  . sucralfate  1 g Oral Q6H   Continuous Infusions: . sodium chloride Stopped (08/21/19 1920)  . sodium chloride 10 mL/hr at 08/30/19 1000  . sodium chloride    . sodium chloride    .  ceFAZolin (ANCEF) IV Stopped (08/29/19 2150)  . dextrose Stopped (08/16/19 1623)  . feeding supplement (VITAL 1.5 CAL) 55 mL/hr at 08/30/19 0700  . phenylephrine (NEO-SYNEPHRINE) Adult infusion 25 mcg/min (08/30/19 1021)     LOS: 36 days   Marylu Lund, MD [facility] Pager On Amion  If 7PM-7AM, please contact night-coverage 08/30/2019, 11:56 AM

## 2019-08-30 NOTE — Progress Notes (Signed)
NAME:  [name], MRN:  [id number], DOB:  [birth year], LOS: 33 ADMISSION DATE:  07/25/2019, CONSULTATION DATE:  10/3 REFERRING MD:  Nadara Mustard, CHIEF COMPLAINT:  Dyspnea   Brief History   67 y/o male diagnosed with COVID on 9/29 presented to the [facility] ED on 10/2 with dyspnea, hypoxemia requiring intubation.  Admitted to [facility], developed AKI requiring CRRT, pneumothorax requiring chest tube and had a large bronchopleural fistula treated for 10 days with a bronchial blocker.  Tracheostomy placed on 10/30.   Past Medical History  GERD  Significant Hospital Events   10/03 Admit to [facility] from [facility] ER, start decadron and remdesivir, given tociluzimab; prone positioning 10/04 convalescent plasma 10/05 stop prone positioning 10/06 convalescent plasma; prone positioning again 10/09 start ABx 10/10 MSSA bacteremia, CVL d/ced; ID consulted; increase OG tube outpt 10/11 vent weaning trial started 10/13 fever, pneumothorax, pig tail chest tube placed 10/14 worsening hypotension and renal fx >> consulted nephrology; worsening PTX >> replaced chest tube; GNR in sputum >> ABx changed 10/15 start CRRT; endobronchial blocker placed for persistent air leak 10/16 endobronchial blocker repositioned, changed chest tube to water seal; melana with ABLA >> GI consulted; transfuse PRBC 10/17 persistent air leak, increased WOB; start nimbex gtt >> air leak decreased; EGD; A fib with RVR >> start amiodarone 10/18 transfuse PRBC; GI s/o 10/20 resume heparin gtt 10/21 stopped nimbex, chest tube to suction, stopped heparin drip because Hgb dropped again 10/22 TPA in chest tube, repositioned endobronchial blocker 10/23 deflated endobronchial balloon 10/24 small air leak  10/25 removed bronchial blocker 10/30 tracheostomy  11/1 D/C CRRT 11/2 Transfer to [facility] 11/3 Started on IHD 11/4-present, weaning  Consults:  ID Nephrology Gastroenterology PCCM  Procedures:   10/2 R IJ CVL > 10/10 10/13 Rt pig tail chest  tube >> 10/14 10/14 R 70F chest tube > 10/14 L IJ HD cath > 10/30 tracheostomy >   Significant Diagnostic Tests:  10/2 CT angiogram chest > extensive bilateral airspace disease predominantly posterior 10/8 doppler legs b/l > no DVT 10/15 renal u/s > normal 10/17 EGD > non bleeding gastric ulcer 10/19 Echo >> EF 60 to 65%, hyperdynamic LV with small LVOT gradient 10/22 CT chest > large collection of air in pleural space with fluid, pneumonia bilaterally R>L endobronchial blocker in place, chest tube anterior 10/31 CXR > persistent small right pneumothorax despite chest in adequate position MRI brain 11/3 > small acute to subacute infarcts involving bilateral cerebral cortex and right cerebellum.  Large b/l mastoid effusions  Micro Data:  9/20 SARS COV 2 > POSITIVE 10/2 blood > negative 10/9 blood > MSSA 2/4 10/9 resp > MSSA, Pseudomonas 10/13 blood >> negative 10/21 bronch wash bacterial > pseduomonas, acinetobacter baumannii 10/21 bronch wash fungal >  10/21 bronch wash aspergillus antigen> negative 10/24 bronchoscopy wash> pseudomonas 10/28 Blood cx>>> 10/28 Sputum cx>>> Pseudomonas 11/1 SARS COV 2 > negative  Antimicrobials:  10/3 remdesivir > 10/6 10/3 actemra 10/3 decadron > 10/12 10/4 convalescent plasma 10/6 convalescent plasma  10/9 vancomycin > 10/10 10/9 cefepime > 10/10 10/10 ancef > 10/13 10/13 cefepime >10/20 10/14 anidulofungin > 10/15  10/20 ancef > 10/23 10/23 meropenem > >>>off  10/21 anidulofungin> 10/22 10/22 voriconazole > 10/23 Merrem 10/28>>>11/2 Vanc 10/28>>>10/30 Ancef 10/31>> 11/3 11/3 ancef>> Interim history/subjective:  Remains off ventilator Denies pain Thick secretions   Objective   Blood pressure 131/78, pulse 93, temperature 98.4 F (36.9 C), temperature source Oral, resp. rate (!) 22, height 5\' 10"  (1.778 m), weight 79.7  kg, SpO2 99 %.    FiO2 (%):  [28 %] 28 %   Intake/Output Summary (Last 24 hours) at 08/30/2019 0715  Last data filed at 08/30/2019 0600 Gross per 24 hour  Intake 2155.59 ml  Output 60 ml  Net 2095.59 ml   Filed Weights   08/26/19 0700 08/28/19 1245 08/28/19 1625  Weight: 79.8 kg 80.7 kg 79.7 kg    Examination: GEN: chronically ill man in NAD HEENT: Trach in place with thick secretions, sutures still in CV: RRR, ext warm PULM: Transmitted coarse upper airway sounds, slightly tachypneic, +AL in R chest tube GI: Soft, +BS EXT: No edema, +muscle wasting NEURO: moves R fingers and toes, extremely deconditioned PSYCH: RASS 0 SKIN: No rashes, rectal bag in place  K 5.7 Severe uremia and Cr noted Gap 17 CBC stable Sugars look okay CXR stable from 4 days ago, BL infiltrates, chest tube, trach, central line in good position Assessment & Plan:   # Acute respiratory failure with hypoxemia - 2/2 COVID-19 (s/p remdsevir, actemra, convalescent plasma, steroids).  Trach dependence - trach placed 10/30 # R pneumothorax - s/p chest tube placement.  Now with persistent airleak / BPF despite endobronchial blocker placement (removed 10/25 due to MSSA bacteremia).  # Neuromuscular weakness/encephalopathy # MSSA bacteremia - Echocardiogram negative.  # Bilateral cerebral cortex and right cerebellum small acute to subacute infarcts. # Anemia of chronic disease  - Continue TC during day, PMV trials - Chest tube per CTS, remains on suction with air leak - PMV trials with SLP - Ancef through Nov 23 per ID - HD trial today, midodrine PRN - Biggest barriers to dispo remain BP fistula and ICU weakness - AM type/screen, transfuse for HgB < 7  Best practice:  Diet: tube feeding Pain/Anxiety/Delirium protocol (if indicated): fentanyl for pain. VAP protocol (if indicated): yes DVT prophylaxis: sub cutaneous heparin GI prophylaxis: Pantoprazole for stress ulcer prophylaxis Glucose control: SSI Mobility: PT  Code Status: DNR if arrests Family Communication:  Disposition: potentially progressive  if cant olerate HD    The patient is critically ill with multiple organ systems failure and requires high complexity decision making for assessment and support, frequent evaluation and titration of therapies, application of advanced monitoring technologies and extensive interpretation of multiple databases. Critical Care Time devoted to patient care services described in this note independent of APP/resident time (if applicable)  is 31 minutes.   Erskine Emery MD [facility] 08/30/2019 7:34 AM Personal pager: [phone] If unanswered, please page CCM On-call: [phone]

## 2019-08-30 NOTE — Progress Notes (Signed)
[place] Progress Note Patient Name: [name] DOB: September 30, 1951 MRN: [id number]   Date of Service  08/30/2019  HPI/Events of Note  Bloody pulmonary secretions - Currently on Heparin 7500 units [place] Q 8 hours. Pharmacy recommends decreasing it to 5000 units [place] Q 8 hours.   eICU Interventions  Will order: 1. Decrease Heparin dose to 5000 units [place] Q 8 hours.  2. Monitor pulmonary secretions and Hgb.      Intervention Category Major Interventions: Other:  Lysle Dingwall 08/30/2019, 11:56 PM

## 2019-08-30 NOTE — Progress Notes (Signed)
Trach sutures removed per MD order.

## 2019-08-31 ENCOUNTER — Other Ambulatory Visit: Payer: Self-pay

## 2019-08-31 ENCOUNTER — Inpatient Hospital Stay (HOSPITAL_COMMUNITY): Payer: Medicare HMO

## 2019-08-31 DIAGNOSIS — N179 Acute kidney failure, unspecified: Secondary | ICD-10-CM | POA: Diagnosis not present

## 2019-08-31 DIAGNOSIS — R579 Shock, unspecified: Secondary | ICD-10-CM

## 2019-08-31 DIAGNOSIS — J9600 Acute respiratory failure, unspecified whether with hypoxia or hypercapnia: Secondary | ICD-10-CM | POA: Diagnosis not present

## 2019-08-31 DIAGNOSIS — E872 Acidosis: Secondary | ICD-10-CM | POA: Diagnosis not present

## 2019-08-31 DIAGNOSIS — J80 Acute respiratory distress syndrome: Secondary | ICD-10-CM | POA: Diagnosis not present

## 2019-08-31 DIAGNOSIS — J1289 Other viral pneumonia: Secondary | ICD-10-CM | POA: Diagnosis not present

## 2019-08-31 DIAGNOSIS — A4901 Methicillin susceptible Staphylococcus aureus infection, unspecified site: Secondary | ICD-10-CM | POA: Diagnosis not present

## 2019-08-31 DIAGNOSIS — I959 Hypotension, unspecified: Secondary | ICD-10-CM | POA: Diagnosis not present

## 2019-08-31 DIAGNOSIS — U071 COVID-19: Secondary | ICD-10-CM | POA: Diagnosis not present

## 2019-08-31 DIAGNOSIS — D649 Anemia, unspecified: Secondary | ICD-10-CM | POA: Diagnosis not present

## 2019-08-31 LAB — BASIC METABOLIC PANEL
Anion gap: 16 — ABNORMAL HIGH (ref 5–15)
BUN: 118 mg/dL — ABNORMAL HIGH (ref 8–23)
CO2: 20 mmol/L — ABNORMAL LOW (ref 22–32)
Calcium: 8.6 mg/dL — ABNORMAL LOW (ref 8.9–10.3)
Chloride: 99 mmol/L (ref 98–111)
Creatinine, Ser: 5.24 mg/dL — ABNORMAL HIGH (ref 0.61–1.24)
GFR calc Af Amer: 12 mL/min — ABNORMAL LOW (ref >60)
GFR calc non Af Amer: 11 mL/min — ABNORMAL LOW (ref >60)
Glucose, Bld: 125 mg/dL — ABNORMAL HIGH (ref 70–99)
Potassium: 5.3 mmol/L — ABNORMAL HIGH (ref 3.5–5.1)
Sodium: 135 mmol/L (ref 135–145)

## 2019-08-31 LAB — HEMOGLOBIN AND HEMATOCRIT, BLOOD
HCT: 19.3 % — ABNORMAL LOW (ref 39.0–52.0)
HCT: 24.4 % — ABNORMAL LOW (ref 39.0–52.0)
Hemoglobin: 6.1 g/dL — CL (ref 13.0–17.0)
Hemoglobin: 7.7 g/dL — ABNORMAL LOW (ref 13.0–17.0)

## 2019-08-31 LAB — GLUCOSE, CAPILLARY
Glucose-Capillary: 107 mg/dL — ABNORMAL HIGH (ref 70–99)
Glucose-Capillary: 108 mg/dL — ABNORMAL HIGH (ref 70–99)
Glucose-Capillary: 114 mg/dL — ABNORMAL HIGH (ref 70–99)
Glucose-Capillary: 120 mg/dL — ABNORMAL HIGH (ref 70–99)
Glucose-Capillary: 129 mg/dL — ABNORMAL HIGH (ref 70–99)
Glucose-Capillary: 129 mg/dL — ABNORMAL HIGH (ref 70–99)

## 2019-08-31 LAB — CBC
HCT: 20.1 % — ABNORMAL LOW (ref 39.0–52.0)
Hemoglobin: 6.2 g/dL — CL (ref 13.0–17.0)
MCH: 29.5 pg (ref 26.0–34.0)
MCHC: 30.8 g/dL (ref 30.0–36.0)
MCV: 95.7 fL (ref 80.0–100.0)
Platelets: 328 10*3/uL (ref 150–400)
RBC: 2.1 MIL/uL — ABNORMAL LOW (ref 4.22–5.81)
RDW: 16.7 % — ABNORMAL HIGH (ref 11.5–15.5)
WBC: 8.8 10*3/uL (ref 4.0–10.5)
nRBC: 0.8 % — ABNORMAL HIGH (ref 0.0–0.2)

## 2019-08-31 LAB — PREPARE RBC (CROSSMATCH)

## 2019-08-31 LAB — ABO/RH: ABO/RH(D): O POS

## 2019-08-31 MED ORDER — SODIUM CHLORIDE 0.9% IV SOLUTION
Freq: Once | INTRAVENOUS | Status: AC
  Administered 2019-08-31: 16:00:00 via INTRAVENOUS

## 2019-08-31 MED ORDER — MIDODRINE HCL 5 MG PO TABS
10.0000 mg | ORAL_TABLET | Freq: Three times a day (TID) | ORAL | Status: DC
  Administered 2019-08-31 – 2019-09-06 (×20): 10 mg
  Filled 2019-08-31 (×20): qty 2

## 2019-08-31 NOTE — Progress Notes (Signed)
Patient ID: [name], male   DOB: 04/10/1951, 68 y.o.   MRN: [id number] S: remains on pressors. Was 2.2 L + yesterday. For HD today.  O:BP 122/70   Pulse 84   Temp 98.6 F (37 C) (Oral)   Resp 20   Ht 5\' 10"  (1.778 m)   Wt 78.9 kg   SpO2 100%   BMI 24.96 kg/m   Intake/Output Summary (Last 24 hours) at 08/31/2019 0020 Last data filed at 08/31/2019 0000 Gross per 24 hour  Intake 3798.6 ml  Output 1240 ml  Net 2558.6 ml   Intake/Output: I/O last 3 completed shifts: In: 4341.6 [I.V.:1877.5; NG/GT:2035.8; IV Piggyback:428.3] Out: 1110 [Other:1000; Chest Tube:110]  Intake/Output this shift:  Total I/O In: 869.9 [I.V.:407.1; NG/GT:412.8; IV Piggyback:50] Out: 150 [Stool:100; Chest Tube:50] Weight change:   Patient not examined today directly given COVID-19 + status, utilizing exam of the primary team and observations of RN's.   Assessment/Plan: 1. AKI/CKD stage 3- in setting of covid PNA and has remained oliguric/anuric. Started on CVVHD 08/07/19-08/24/19. He is off pressors but remains anuric with rising BUN/Cr. 1. He was transferred to [facility] for IHD 08/25/19 but required pressors 2. Hyperkalemia and markedly elevated BUN likely due to hypercatabolic state. For regular 4h HD today.  If he cannot tolerate it hemodynamically, or if unable to control BUN/ Creat, will have to resume CVVHD. 3. Per neuro, he needs to keep his SBP >120 for perfusion following his recent strokes. He is on neo gtt at 35- 50 mcg/min 2. COVID PNA- treated with remdesivir, dexamethasone, and convalescent plasma. Most recent covid test was negative x 2 on 08/24/19 and 08/25/19. 3. VDRF - s/p trach and on trach collar per PCCM 4. Sepsis/shock- off of pressors 5. Right pneumothorax/emphyema/necrotic RLL- s/p chest tube with air leak. Per CT Surgery he is not a candidate for VATS, however concern that necrotic RLL is source of hypercatabolic state. 6. New cerebral and cerebellar strokes- per primary svc. 7. A  fib/RVR- per PCCM 8. GIB- acute s/p EGD which showed gastric ulcer with stigmata of recent bleeding s/p injection and clipping. Able to tolerate low dose heparin with CVVHD before stopping it. 9. MSSA bacteremia- treated 10. Acinetobacter and pseudomonal pna- per PCCM, meropenem for 7 days  [name], MD 08/30/2019, 4:45 PM Recent Labs  Lab 08/24/19 0515 08/25/19 0517 08/26/19 0724 08/27/19 0336 08/28/19 0409 08/29/19 0528 08/30/19 0553  NA 137 138 140 139 139 136 136  K 4.4 4.9 6.2* 4.8 6.2* 5.3* 5.7*  CL 103 103 105 101 101 99 98  CO2 26 22 19* 21* 19* 23 21*  GLUCOSE 117* 123* 139* 117* 124* 115* 121*  BUN 38* 100* 145* 91* 146* 83* 136*  CREATININE 1.60* 4.02* 6.43* 4.44* 6.61* 4.21* 6.09*  ALBUMIN 2.5*  --  2.1*  --   --   --   --   CALCIUM 8.7* 9.5 9.6 9.3 9.4 8.5* 8.8*  PHOS 2.9 5.7* 9.5*  --   --   --  9.7*   Liver Function Tests: Recent Labs  Lab 08/24/19 0515 08/26/19 0724  ALBUMIN 2.5* 2.1*   No results for input(s): LIPASE, AMYLASE in the last 168 hours. No results for input(s): AMMONIA in the last 168 hours. CBC: Recent Labs  Lab 08/26/19 0724 08/27/19 0336 08/28/19 0409 08/29/19 0528 08/30/19 0553  WBC 9.8 8.3 7.7 8.7 9.0  NEUTROABS  --  6.1 5.7 6.6 6.9  HGB 8.3* 7.8* 7.9* 7.4* 7.2*  HCT 27.2*  25.0* 25.4* 24.2* 23.3*  MCV 96.5 94.3 93.4 96.0 95.5  PLT 295 272 310 312 361   Cardiac Enzymes: No results for input(s): CKTOTAL, CKMB, CKMBINDEX, TROPONINI in the last 168 hours. CBG: Recent Labs  Lab 08/30/19 0841 08/30/19 1245 08/30/19 1647 08/30/19 2022 08/31/19 0015  GLUCAP 104* 92 86 89 108*    Iron Studies: No results for input(s): IRON, TIBC, TRANSFERRIN, FERRITIN in the last 72 hours. Studies/Results: Dg Chest Port 1 View  Result Date: 08/30/2019 CLINICAL DATA:  Pneumothorax on right EXAM: PORTABLE CHEST 1 VIEW COMPARISON:  Chest radiograph 08/26/2019, 08/23/2019 FINDINGS: Stable cardiomediastinal contours. Unchanged for apparatus.  Persistent small right basilar pneumothorax with chest tube in place. Persistent infiltrates/atelectasis in the right mid upper lung and left base. No large pleural effusion. No acute finding in the visualized skeleton. IMPRESSION: Unchanged chest radiograph with persistent small right basilar pneumothorax and atelectasis/infiltrates in the right mid to upper lung as well as left base. Electronically Signed   By: Audie Pinto M.D.   On: 08/30/2019 09:42   Dg Abd Portable 1v  Result Date: 08/30/2019 CLINICAL DATA:  NG tube placement EXAM: PORTABLE ABDOMEN - 1 VIEW COMPARISON:  08/16/2019 FINDINGS: There is a nasogastric tube with the tip projecting over the stomach. There is no bowel dilatation to suggest obstruction. There is no evidence of pneumoperitoneum, portal venous gas or pneumatosis. There are no pathologic calcifications along the expected course of the ureters. The osseous structures are unremarkable. IMPRESSION: Nasogastric tube with the tip projecting over the stomach. Electronically Signed   By: Kathreen Devoid   On: 08/30/2019 15:56   . aspirin  325 mg Per Tube Daily  . atorvastatin  40 mg Oral q1800  . chlorhexidine  15 mL Mouth/Throat BID  . Chlorhexidine Gluconate Cloth  6 each Topical Daily  . darbepoetin (ARANESP) injection - NON-DIALYSIS  100 mcg Subcutaneous Q Wed-1800  . famotidine  20 mg Per Tube Daily  . feeding supplement (PRO-STAT SUGAR FREE 64)  60 mL Per Tube BID  . Gerhardt's butt cream   Topical TID  . heparin injection (subcutaneous)  5,000 Units Subcutaneous Q8H  . insulin aspart  0-9 Units Subcutaneous Q4H  . mouth rinse  15 mL Mouth Rinse 10 times per day  . sodium zirconium cyclosilicate  10 g Oral Daily  . sucralfate  1 g Oral Q6H    BMET    Component Value Date/Time   NA 136 08/30/2019 0553   NA 143 11/08/2017 1030   K 5.7 (H) 08/30/2019 0553   CL 98 08/30/2019 0553   CO2 21 (L) 08/30/2019 0553   GLUCOSE 121 (H) 08/30/2019 0553   BUN 136 (H)  08/30/2019 0553   BUN 8 11/08/2017 1030   CREATININE 6.09 (H) 08/30/2019 0553   CREATININE 1.15 11/20/2014 1224   CALCIUM 8.8 (L) 08/30/2019 0553   GFRNONAA 9 (L) 08/30/2019 0553   GFRAA 10 (L) 08/30/2019 0553   CBC    Component Value Date/Time   WBC 9.0 08/30/2019 0553   RBC 2.44 (L) 08/30/2019 0553   HGB 7.2 (L) 08/30/2019 0553   HGB 15.8 11/08/2017 1030   HCT 23.3 (L) 08/30/2019 0553   HCT 46.6 11/08/2017 1030   PLT 361 08/30/2019 0553   PLT 202 11/08/2017 1030   MCV 95.5 08/30/2019 0553   MCV 93 11/08/2017 1030   MCH 29.5 08/30/2019 0553   MCHC 30.9 08/30/2019 0553   RDW 16.5 (H) 08/30/2019 0553   RDW 14.2  11/08/2017 1030   LYMPHSABS 0.8 08/30/2019 0553   LYMPHSABS 1.1 11/08/2017 1030   MONOABS 0.6 08/30/2019 0553   EOSABS 0.5 08/30/2019 0553   EOSABS 0.1 11/08/2017 1030   BASOSABS 0.0 08/30/2019 0553   BASOSABS 0.0 11/08/2017 1030

## 2019-08-31 NOTE — Progress Notes (Signed)
NAME:  [name], MRN:  [id number], DOB:  19-Aug-1951, LOS: 73 ADMISSION DATE:  07/25/2019, CONSULTATION DATE:  10/3 REFERRING MD:  Nadara Mustard, CHIEF COMPLAINT:  Dyspnea   Brief History   68 y/o male diagnosed with COVID on 9/29 presented to the [facility] ED on 10/2 with dyspnea, hypoxemia requiring intubation.  Admitted to [facility], developed AKI requiring CRRT, pneumothorax requiring chest tube and had a large bronchopleural fistula treated for 10 days with a bronchial blocker.  Tracheostomy placed on 10/30.   Past Medical History  GERD  Significant Hospital Events   10/03 Admit to [facility] from [facility] ER, start decadron and remdesivir, given tociluzimab; prone positioning 10/04 convalescent plasma 10/05 stop prone positioning 10/06 convalescent plasma; prone positioning again 10/09 start ABx 10/10 MSSA bacteremia, CVL d/ced; ID consulted; increase OG tube outpt 10/11 vent weaning trial started 10/13 fever, pneumothorax, pig tail chest tube placed 10/14 worsening hypotension and renal fx >> consulted nephrology; worsening PTX >> replaced chest tube; GNR in sputum >> ABx changed 10/15 start CRRT; endobronchial blocker placed for persistent air leak 10/16 endobronchial blocker repositioned, changed chest tube to water seal; melana with ABLA >> GI consulted; transfuse PRBC 10/17 persistent air leak, increased WOB; start nimbex gtt >> air leak decreased; EGD; A fib with RVR >> start amiodarone 10/18 transfuse PRBC; GI s/o 10/20 resume heparin gtt 10/21 stopped nimbex, chest tube to suction, stopped heparin drip because Hgb dropped again 10/22 TPA in chest tube, repositioned endobronchial blocker 10/23 deflated endobronchial balloon 10/24 small air leak  10/25 removed bronchial blocker 10/30 tracheostomy  11/1 D/C CRRT 11/2 Transfer to [facility] 11/3 Started on IHD 11/7 trach sutures removed 11/4-present, weaning on ATC   Consults:  ID Nephrology Gastroenterology PCCM  Procedures:   10/2 R IJ  CVL > 10/10 10/13 Rt pig tail chest tube >> 10/14 10/14 R 39F chest tube > 10/14 L IJ HD cath > 10/30 tracheostomy >   Significant Diagnostic Tests:  10/2 CT angiogram chest > extensive bilateral airspace disease predominantly posterior 10/8 doppler legs b/l > no DVT 10/15 renal u/s > normal 10/17 EGD > non bleeding gastric ulcer 10/19 Echo >> EF 60 to 65%, hyperdynamic LV with small LVOT gradient 10/22 CT chest > large collection of air in pleural space with fluid, pneumonia bilaterally R>L endobronchial blocker in place, chest tube anterior 10/31 CXR > persistent small right pneumothorax despite chest in adequate position MRI brain 11/3 > small acute to subacute infarcts involving bilateral cerebral cortex and right cerebellum.  Large b/l mastoid effusions  Micro Data:  9/20 SARS COV 2 > POSITIVE 10/2 blood > negative 10/9 blood > MSSA 2/4 10/9 resp > MSSA, Pseudomonas 10/13 blood >> negative 10/21 bronch wash bacterial > pseduomonas, acinetobacter baumannii 10/21 bronch wash fungal >  10/21 bronch wash aspergillus antigen> negative 10/24 bronchoscopy wash> pseudomonas 10/28 Blood cx>>> 10/28 Sputum cx>>> Pseudomonas 11/1 SARS COV 2 > negative  Antimicrobials:  10/3 remdesivir > 10/6 10/3 actemra 10/3 decadron > 10/12 10/4 convalescent plasma 10/6 convalescent plasma  10/9 vancomycin > 10/10 10/9 cefepime > 10/10 10/10 ancef > 10/13 10/13 cefepime >10/20 10/14 anidulofungin > 10/15  10/20 ancef > 10/23 10/23 meropenem > >>>off  10/21 anidulofungin> 10/22 10/22 voriconazole > 10/23 Merrem 10/28>>>11/2 Vanc 10/28>>>10/30 Ancef 10/31>> 11/3 11/3 ancef>> Interim history/subjective:  Some bloody secretions yesterday Heparin decreased Denies pain  Objective   Blood pressure 112/66, pulse (!) 111, temperature 99.7 F (37.6 C), temperature source Oral, resp. rate Marland Kitchen)  28, height 5\' 10"  (1.778 m), weight 78.9 kg, SpO2 99 %.    FiO2 (%):  [28 %-35 %] 35 %    Intake/Output Summary (Last 24 hours) at 08/31/2019 0736 Last data filed at 08/31/2019 0600 Gross per 24 hour  Intake 3774.71 ml  Output 1200 ml  Net 2574.71 ml   Filed Weights   08/28/19 1625 08/30/19 1449 08/30/19 1859  Weight: 79.7 kg 79.9 kg 78.9 kg    Examination: GEN: chronically ill man in NAD HEENT: Trach in place with thick secretions, sutures still in CV: RRR, ext warm PULM: R>L rhonci, less tachypneic today, +AL in R chest tube GI: Soft, +BS EXT: No edema, +muscle wasting NEURO: moves R fingers and toes, extremely deconditioned PSYCH: RASS 0 SKIN: No rashes, rectal bag in place  Labs pending Sugars okay CXR stable with small R PTX  Assessment & Plan:   # Acute respiratory failure with hypoxemia - 2/2 COVID-19 (s/p remdsevir, actemra, convalescent plasma, steroids).  Trach dependence - trach placed 10/30 # R pneumothorax - s/p chest tube placement.  Now with persistent airleak / BPF despite endobronchial blocker placement (removed 10/25 due to MSSA bacteremia).  # Neuromuscular weakness/encephalopathy # Persistent shock/vasoplegia # MSSA bacteremia - Echocardiogram negative.  # Bilateral cerebral cortex and right cerebellum small acute to subacute infarcts. # Anemia of chronic disease  - Continue TC during day, PMV trials - Chest tube per CTS, remains on suction with air leak - PMV trials with SLP - Ancef through Nov 23 per ID - Start standing midodrine, wean pressors - Biggest barriers to dispo remain BP fistula and ICU weakness - Trend CBC  Best practice:  Diet: tube feeding Pain/Anxiety/Delirium protocol (if indicated): fentanyl for pain. VAP protocol (if indicated): yes DVT prophylaxis: sub cutaneous heparin GI prophylaxis: Pantoprazole for stress ulcer prophylaxis Glucose control: SSI Mobility: PT  Code Status: DNR if arrests Family Communication:  Disposition: ICU pending pressor liberation    The patient is critically ill with multiple organ  systems failure and requires high complexity decision making for assessment and support, frequent evaluation and titration of therapies, application of advanced monitoring technologies and extensive interpretation of multiple databases. Critical Care Time devoted to patient care services described in this note independent of APP/resident time (if applicable)  is 32 minutes.   Erskine Emery MD [place] [facility] 08/31/2019 7:36 AM Personal pager: [phone] If unanswered, please page CCM On-call: [phone]

## 2019-08-31 NOTE — Progress Notes (Signed)
CRITICAL VALUE ALERT  Critical Value:  Hgb 6.2  Date & Time Notied:  08/31/19  2458  Provider Notified: Dr. Tamala Julian  Orders Received/Actions taken: repeat H&H  Dametria Tuzzolino, Zenovia Jordan, RN

## 2019-08-31 NOTE — Progress Notes (Signed)
PALLIATIVE NOTE:  Referral received for goals of care discussion. I met with wife, Burtis Imhoff at the bedside. Introduced myself and Palliative Medicine's role as part of Mr. [facility] care team. Mrs. Mann verbalized understanding and appreciation.   She shares briefly her hopes in her husband's improvement with awareness of "all that he has going on!" She states she would like for her 3 children to be involved in or [place] discussion to offer support and provide their input. I verbalized understanding and confirmed this was an appropriate and reasonable request. We discussed conference calling and in-person meeting. Mrs. Collene Mares states her daughters live out of town and it may require a conference call meeting with them and possibly her and her son coming in-person.   [name] requesting to attentively plan to meet tomorrow 09/01/19 at 11am for [place] discussion. She plans to call her children and confirm and will call if time should be changed. I advised if her son or any other children are planning to come in person, I would need to hear back regarding this in advance to make arrangements given our 1 person visitation. I do feel it is reasonable given the critical need for decision making and to provide the best care for [name] that if children desired to be present for meeting face-to-face this be allowed. Again wife aware to contact me with final plans and to confirm meeting time. I will plan to meet her at the patient's bedside at 11 am tomorrow and call her children on speakerphone if no further communicated changes.   Thank you for your referral and allowing Palliative to assist in Mr. [facility] care. Detailed note and recommendation to follow [place] meeting.   Alda Lea, AGPCNP-BC Palliative Medicine Team  Phone: [phone] Pager: [phone]  No Charge

## 2019-08-31 NOTE — Progress Notes (Signed)
PROGRESS NOTE    [name]  YNW:[id number] DOB: 14-Apr-1951 DOA: 07/25/2019 PCP: Kathyrn Drown, MD    Brief Narrative:  68 year old with a history of GERD and BPH who presented to [facility], ED with S OB and was found to be hypoxic. He was initially diagnosed with COVID-19 September 20. 1 to 2 days prior to his presentation he developed worsening shortness of breath with anorexia. His wife's physician during a teleconference call noted that the patient himself is very tachypneic and ultimately EMS was sent to the house. EMS found the patient to have saturations in the 40s. In the ED on nonrebreather the patient sats only improved to the 80s. The patient was severely confused and required emergent intubation. Chest x-ray confirmed multifocal infiltrates. CT chest was negative for PE  Assessment & Plan:   Principal Problem:   Pneumonia due to COVID-19 virus Active Problems:   BPH (benign prostatic hyperplasia)   Acute respiratory failure due to COVID-19 (HCC)   AKI (acute kidney injury) (HCC)   Fluid overload   Pneumothorax on right   Acute renal failure (ARF) (HCC)   GI bleed   Atrial fibrillation with RVR (HCC)   Constipation   Gastric ulcer with hemorrhage   Acute respiratory failure (HCC)   Chest tube in place   Primary spontaneous pneumothorax   Acute respiratory distress syndrome (ARDS) due to COVID-19 virus ([place])   Tracheostomy status (HCC)   Cerebral thrombosis with cerebral infarction   Acute Hypoxic Resp. Failure/Pneumonia due to COVID-19 -2 negative COVID-19 test, last on 08/25/2019 -Patient underwent tracheostomy on 10/30, somewhat still vent dependent Intermittent trach collar -Completed course of remdesivir and steroids -Patient also received Actemra and convalescent plasma -Continue pulmonary toilet/trach care per protocol -PCCM on board for assistance with vent management -Not candidate for valve or blocker per PCCM. Cont vent mgt per PCCM   Recent Labs       Lab Results  Component Value Date   SARSCOV2NAA NEGATIVE 08/25/2019   [place] NEGATIVE 08/24/2019   [place] Detected (A) 07/22/2019     COVID-19 specific Treatment: Decadron 10/2 > 10/12 Remdesivir 10/2 > 10/6 Actemra 10/2 Convalescent plasma 10/3  Encephalopathy/generalized weakness/deconditioned, acute CVA -Recent MRI performed and reviewed, findings of bilateral infarcts -Appreciate input by Stroke team. Recommendation for ASA, EEG, permissive HTN. Also to start statin when able to -Discussed with Dr. Erlinda Hong today. Recommendation to avoid hypotension, especially during HD -Continue PT/OT as tolerated -Neurology not recommending anticoagulation given hx GI bleeding -Patient remains on pressor support to maintain appropriate BP, per below -Overall poor prognosis . See note from 11/7. Family is agreeable to [place] discussion with Palliative Care. 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 Persistent air leak is noted.  MSSA bacteremia/septic shock -Currently afebrile, with no leukocytosis -2 out of 2 blood cultures positive on 10/9, repeat BC have been negative, last on 08/20/19 NGTD -Echo done on 08/11/2019 was negative -Concern remains for recurrent bacteremia and possible endocarditis, ID recommended Cefazolin to be continued for 24 days from 10/31 -Currently remains stable  Pseudomonas and Acinetobacter in BAL specimen -Patient was started on meropenem on 10/23 for 10-day treatment -Completed  course of abx on 08/25/19  AKI on CKD stage 3 -Remains anuric, HD  dependent -Status post CRRT, last done on 11/1 -Initiated HD on 08/26/2019 -Nephrology continues to follow for HD. Remains on pressors to maintain adequate perfusion  Acute GI bleed secondary to gastric ulcer -S/p EGD with clipping of vessel and large ulcer -H. pylori antibody was noted to be elevated -[facility] had been following -Continue PPI -Patient will need 2 weeks treatment for H. pylori when he is ready for discharge -Anticipate f/u with [facility] gastroenterology when discharged ultimately  Acute blood loss anemia -Likely secondary to above -Patient has been transfused with 5 units of PRBC so far during this hospitalization Last transfused with 2 units on 10/28 -This AM, pt noted to have hgb down to 6.2, repeat confirms hgb 6.1 -Have ordered 1 unit PRBC transfusion -Follow CBC trend  Elevated D-dimer -No evidence for DVT on Dopplers on 10/8 -Thought to be related to suspected empyema in the right lung -Currently stable at this time  New onset paroxysmal atrial fibrillation with RVR -Heart rate controlled, currently sinus rhythm -Atrial fibrillation likely occurred in the setting of acute illness -Unable to fully anticoagulate due to GI bleeding -See above, acute stroke identified. Have consulted Neurology. Appreciate input. Recommendation to continue ASA. Unable to tolerate anticoagulation secondary to hx of transfusion dependent GI bleeding  Pressure injuries Pressure Injury 08/16/19 Lip Medial;Lower Stage I -  Intact skin with non-blanchable redness of a localized area usually over a bony prominence. healing  (Active)  08/16/19 2000  Location: Lip  Location Orientation: Medial;Lower  Staging: Stage I -  Intact skin with non-blanchable redness of a localized area usually over a bony prominence.  Wound Description (Comments): healing   Present on Admission: No     Pressure Injury 08/16/19 Lip Lower;Mid Stage I -  Intact skin with non-blanchable redness of a localized  area usually over a bony prominence. healing  (Active)  08/16/19 2342  Location: Lip  Location Orientation: Lower;Mid  Staging: Stage I -  Intact skin with non-blanchable redness of a localized area usually over a bony prominence.  Wound Description (Comments): healing   Present on Admission:      Pressure Injury 08/24/19 Buttocks Right;Left;Mid Deep Tissue Injury - Purple or maroon localized area of discolored intact skin or blood-filled blister due to damage of underlying soft tissue from pressure and/or shear. (Active)  08/24/19 0800  Location: Buttocks  Location Orientation: Right;Left;Mid  Staging: Deep Tissue Injury - Purple or maroon localized area of discolored intact skin or blood-filled blister due to damage of underlying soft tissue from pressure and/or shear.  Wound Description (Comments):   Present on Admission: No    DVT prophylaxis: heparin subQ Code Status: DNR Family Communication: Pt in room, wife at bedside Disposition Plan: Uncertain at this time  Consultants:  Pulmonology.  Nephrology.  Gastroenterology., Neurology  Procedures:     Antimicrobials: Anti-infectives (From admission, onward)   Start     Dose/Rate Route Frequency Ordered Stop   08/26/19 2200  ceFAZolin (ANCEF) IVPB 1 g/50 mL premix     1 g 100 mL/hr over 30 Minutes Intravenous Every 24 hours 08/24/19 1021 09/15/19 2359   08/24/19 1100  meropenem (MERREM) 1 g in sodium chloride 0.9 % 100 mL IVPB     1 g 200 mL/hr over 30 Minutes Intravenous Every 24 hours 08/24/19 1021 08/25/19 1156   08/23/19 1100  ceFAZolin (ANCEF) IVPB 2g/100 mL premix  Status:  Discontinued  2 g 200 mL/hr over 30 Minutes Intravenous Every 12 hours 08/23/19 1048 08/24/19 1021   08/21/19 1700  vancomycin (VANCOCIN) IVPB 750 mg/150 ml premix  Status:  Discontinued     750 mg 150 mL/hr over 60 Minutes Intravenous Every 24 hours 08/21/19 0843 08/23/19 1048   08/21/19 1400  vancomycin (VANCOCIN) IVPB 750 mg/150 ml premix   Status:  Discontinued     750 mg 150 mL/hr over 60 Minutes Intravenous Every 24 hours 08/20/19 1146 08/20/19 1218   08/21/19 1000  meropenem (MERREM) 1 g in sodium chloride 0.9 % 100 mL IVPB  Status:  Discontinued     1 g 200 mL/hr over 30 Minutes Intravenous Every 12 hours 08/20/19 1535 08/23/19 1048   08/21/19 0413  meropenem (MERREM) 500 mg in sodium chloride 0.9 % 100 mL IVPB  Status:  Discontinued     500 mg 200 mL/hr over 30 Minutes Intravenous Every 24 hours 08/20/19 1245 08/20/19 1535   08/20/19 1330  vancomycin (VANCOCIN) 1,750 mg in sodium chloride 0.9 % 500 mL IVPB     1,750 mg 250 mL/hr over 120 Minutes Intravenous  Once 08/20/19 1222 08/20/19 1619   08/20/19 1300  vancomycin (VANCOCIN) 1,250 mg in sodium chloride 0.9 % 250 mL IVPB  Status:  Discontinued     1,250 mg 166.7 mL/hr over 90 Minutes Intravenous  Once 08/20/19 1146 08/20/19 1218   08/20/19 1224  vancomycin variable dose per unstable renal function (pharmacist dosing)  Status:  Discontinued      Does not apply See admin instructions 08/20/19 1225 08/21/19 1152   08/15/19 1600  meropenem (MERREM) 1 g in sodium chloride 0.9 % 100 mL IVPB  Status:  Discontinued     1 g 200 mL/hr over 30 Minutes Intravenous Every 12 hours 08/15/19 1543 08/20/19 1245   08/15/19 1000  voriconazole (VFEND) tablet 200 mg  Status:  Discontinued     200 mg Per Tube Every 12 hours 08/14/19 1148 08/15/19 1134   08/14/19 1600  anidulafungin (ERAXIS) 100 mg in sodium chloride 0.9 % 100 mL IVPB  Status:  Discontinued     100 mg 78 mL/hr over 100 Minutes Intravenous Every 24 hours 08/13/19 1645 08/14/19 1148   08/14/19 1300  voriconazole (VFEND) tablet 400 mg     400 mg Per Tube Every 12 hours 08/14/19 1148 08/14/19 2217   08/13/19 1700  anidulafungin (ERAXIS) 200 mg in sodium chloride 0.9 % 200 mL IVPB     200 mg 78 mL/hr over 200 Minutes Intravenous  Once 08/13/19 1645 08/13/19 2030   08/13/19 0200  ceFAZolin (ANCEF) IVPB 2g/100 mL premix   Status:  Discontinued     2 g 200 mL/hr over 30 Minutes Intravenous Every 12 hours 08/11/19 1512 08/15/19 1543   08/12/19 0200  ceFEPIme (MAXIPIME) 2 g in sodium chloride 0.9 % 100 mL IVPB     2 g 200 mL/hr over 30 Minutes Intravenous Every 12 hours 08/11/19 1509 08/12/19 1341   08/07/19 2200  ceFEPIme (MAXIPIME) 2 g in sodium chloride 0.9 % 100 mL IVPB  Status:  Discontinued     2 g 200 mL/hr over 30 Minutes Intravenous Every 12 hours 08/07/19 1540 08/11/19 1509   08/07/19 1000  anidulafungin (ERAXIS) 100 mg in sodium chloride 0.9 % 100 mL IVPB  Status:  Discontinued     100 mg 78 mL/hr over 100 Minutes Intravenous Every 24 hours 08/06/19 0934 08/08/19 1203   08/07/19 0800  ceFEPIme (MAXIPIME)  2 g in sodium chloride 0.9 % 100 mL IVPB  Status:  Discontinued     2 g 200 mL/hr over 30 Minutes Intravenous Every 24 hours 08/06/19 1036 08/07/19 1540   08/06/19 1000  anidulafungin (ERAXIS) 200 mg in sodium chloride 0.9 % 200 mL IVPB    Note to Pharmacy: please   200 mg 78 mL/hr over 200 Minutes Intravenous Every 24 hours 08/06/19 0925 08/06/19 1600   08/06/19 0800  ceFEPIme (MAXIPIME) 2 g in sodium chloride 0.9 % 100 mL IVPB  Status:  Discontinued     2 g 200 mL/hr over 30 Minutes Intravenous Every 12 hours 08/06/19 0733 08/06/19 1036   08/02/19 2200  ceFAZolin (ANCEF) IVPB 2g/100 mL premix  Status:  Discontinued     2 g 200 mL/hr over 30 Minutes Intravenous Every 8 hours 08/02/19 1338 08/06/19 0733   08/02/19 1400  vancomycin (VANCOCIN) 1,250 mg in sodium chloride 0.9 % 250 mL IVPB  Status:  Discontinued     1,250 mg 166.7 mL/hr over 90 Minutes Intravenous Every 24 hours 08/01/19 1320 08/02/19 1332   08/01/19 1330  vancomycin (VANCOCIN) 2,000 mg in sodium chloride 0.9 % 500 mL IVPB     2,000 mg 250 mL/hr over 120 Minutes Intravenous  Once 08/01/19 1158 08/01/19 1636   08/01/19 1300  ceFEPIme (MAXIPIME) 2 g in sodium chloride 0.9 % 100 mL IVPB  Status:  Discontinued     2 g 200 mL/hr over  30 Minutes Intravenous Every 12 hours 08/01/19 1158 08/02/19 1332   07/26/19 1600  remdesivir 100 mg in sodium chloride 0.9 % 250 mL IVPB     100 mg 500 mL/hr over 30 Minutes Intravenous Every 24 hours 07/26/19 0132 07/29/19 1823   07/26/19 0215  remdesivir 200 mg in sodium chloride 0.9 % 250 mL IVPB     200 mg 500 mL/hr over 30 Minutes Intravenous Once 07/26/19 0132 07/26/19 0520      Subjective: Cannot obtain given level of mentation  Objective: Vitals:   08/31/19 1113 08/31/19 1200 08/31/19 1300 08/31/19 1400  BP:  118/66 109/64 120/69  Pulse: (!) 102 99 91 (!) 102  Resp: (!) 23 (!) 23 (!) 24 (!) 24  Temp:  99.4 F (37.4 C)    TempSrc:  Axillary    SpO2: 98% 100% 100% 100%  Weight:      Height:        Intake/Output Summary (Last 24 hours) at 08/31/2019 1435 Last data filed at 08/31/2019 1000 Gross per 24 hour  Intake 3587.36 ml  Output 1200 ml  Net 2387.36 ml   Filed Weights   08/28/19 1625 08/30/19 1449 08/30/19 1859  Weight: 79.7 kg 79.9 kg 78.9 kg    Examination: General exam: awake but not conversant, in no acute distress Respiratory system: normal chest rise, clear, no audible wheezing Cardiovascular system: regular rhythm, s1-s2 Gastrointestinal system: Nondistended, nontender, pos BS Central nervous system: No seizures, no tremors Extremities: No cyanosis, no joint deformities Skin: No rashes, no pallor Psychiatry: unable to assess given current mentation  Data Reviewed: I have personally reviewed following labs and imaging studies  CBC: Recent Labs  Lab 08/25/19 0517  08/27/19 0336 08/28/19 0409 08/29/19 0528 08/30/19 0553 08/31/19 0800 08/31/19 1315  WBC 10.2   < > 8.3 7.7 8.7 9.0 8.8  --   NEUTROABS 8.1*  --  6.1 5.7 6.6 6.9  --   --   HGB 8.6*   < > 7.8* 7.9* 7.4*  7.2* 6.2* 6.1*  HCT 28.0*   < > 25.0* 25.4* 24.2* 23.3* 20.1* 19.3*  MCV 96.2   < > 94.3 93.4 96.0 95.5 95.7  --   PLT 246   < > 272 310 312 361 328  --    < > = values in this  interval not displayed.   Basic Metabolic Panel: Recent Labs  Lab 08/25/19 0517 08/26/19 0724 08/27/19 0336 08/28/19 0409 08/29/19 0528 08/30/19 0553 08/31/19 0800  NA 138 140 139 139 136 136 135  K 4.9 6.2* 4.8 6.2* 5.3* 5.7* 5.3*  CL 103 105 101 101 99 98 99  CO2 22 19* 21* 19* 23 21* 20*  GLUCOSE 123* 139* 117* 124* 115* 121* 125*  BUN 100* 145* 91* 146* 83* 136* 118*  CREATININE 4.02* 6.43* 4.44* 6.61* 4.21* 6.09* 5.24*  CALCIUM 9.5 9.6 9.3 9.4 8.5* 8.8* 8.6*  MG 2.9*  --   --   --   --  2.9*  --   PHOS 5.7* 9.5*  --   --   --  9.7*  --    GFR: Estimated Creatinine Clearance: 14.3 mL/min (A) (by C-G formula based on SCr of 5.24 mg/dL (H)). Liver Function Tests: Recent Labs  Lab 08/26/19 0724  ALBUMIN 2.1*   No results for input(s): LIPASE, AMYLASE in the last 168 hours. No results for input(s): AMMONIA in the last 168 hours. Coagulation Profile: No results for input(s): INR, PROTIME in the last 168 hours. Cardiac Enzymes: No results for input(s): CKTOTAL, CKMB, CKMBINDEX, TROPONINI in the last 168 hours. BNP (last 3 results) No results for input(s): PROBNP in the last 8760 hours. HbA1C: No results for input(s): HGBA1C in the last 72 hours. CBG: Recent Labs  Lab 08/30/19 2022 08/31/19 0015 08/31/19 0440 08/31/19 0904 08/31/19 1205  GLUCAP 89 108* 129* 120* 107*   Lipid Profile: Recent Labs    08/29/19 0528  CHOL 134  HDL 30*  LDLCALC 66  TRIG 192*  CHOLHDL 4.5   Thyroid Function Tests: No results for input(s): TSH, T4TOTAL, FREET4, T3FREE, THYROIDAB in the last 72 hours. Anemia Panel: No results for input(s): VITAMINB12, FOLATE, FERRITIN, TIBC, IRON, RETICCTPCT in the last 72 hours. Sepsis Labs: Recent Labs  Lab 08/25/19 0517  PROCALCITON 5.82    Recent Results (from the past 240 hour(s))  SARS CORONAVIRUS 2 (TAT 6-24 HRS) Nasopharyngeal Nasopharyngeal Swab     Status: None   Collection Time: 08/24/19  1:55 PM   Specimen: Nasopharyngeal  Swab  Result Value Ref Range Status   SARS Coronavirus 2 NEGATIVE NEGATIVE Final    Comment: (NOTE) SARS-CoV-2 target nucleic acids are NOT DETECTED. The SARS-CoV-2 RNA is generally detectable in upper and lower respiratory specimens during the acute phase of infection. Negative results do not preclude SARS-CoV-2 infection, do not rule out co-infections with other pathogens, and should not be used as the sole basis for treatment or other patient management decisions. Negative results must be combined with clinical observations, patient history, and epidemiological information. The expected result is Negative. Fact Sheet for Patients: SugarRoll.be Fact Sheet for Healthcare Providers: https://www.woods-mathews.com/ This test is not yet approved or cleared by the [place] FDA and  has been authorized for detection and/or diagnosis of SARS-CoV-2 by FDA under an Emergency Use Authorization (EUA). This EUA will remain  in effect (meaning this test can be used) for the duration of the COVID-19 declaration under Section 56 4(b)(1) of the Act, 21 U.S.C. section 360bbb-3(b)(1), unless the  authorization is terminated or revoked sooner. Performed at [facility], [place] [address], [place], [place] [address]   SARS CORONAVIRUS 2 (TAT 6-24 HRS) Nasopharyngeal Nasopharyngeal Swab     Status: None   Collection Time: 08/25/19 11:37 AM   Specimen: Nasopharyngeal Swab  Result Value Ref Range Status   SARS Coronavirus 2 NEGATIVE NEGATIVE Final    Comment: (NOTE) SARS-CoV-2 target nucleic acids are NOT DETECTED. The SARS-CoV-2 RNA is generally detectable in upper and lower respiratory specimens during the acute phase of infection. Negative results do not preclude SARS-CoV-2 infection, do not rule out co-infections with other pathogens, and should not be used as the sole basis for treatment or other patient management decisions. Negative results must  be combined with clinical observations, patient history, and epidemiological information. The expected result is Negative. Fact Sheet for Patients: SugarRoll.be Fact Sheet for Healthcare Providers: https://www.woods-mathews.com/ This test is not yet approved or cleared by the [place] FDA and  has been authorized for detection and/or diagnosis of SARS-CoV-2 by FDA under an Emergency Use Authorization (EUA). This EUA will remain  in effect (meaning this test can be used) for the duration of the COVID-19 declaration under Section 56 4(b)(1) of the Act, 21 U.S.C. section 360bbb-3(b)(1), unless the authorization is terminated or revoked sooner. Performed at [facility], [place] [address], [place], [place] [address]      Radiology Studies: Dg Chest Port 1 View  Result Date: 08/31/2019 CLINICAL DATA:  COVID-19 positive.  Tracheostomy tube. EXAM: PORTABLE CHEST 1 VIEW COMPARISON:  08/30/2019 FINDINGS: Tracheostomy tube in adequate position. Enteric tube courses into the stomach and off the film as tip is not visualized. Right medial basilar chest tube unchanged. Left IJ central venous catheter horizontally oriented over the SVC unchanged. Lungs are hypoinflated with persistent tiny right-sided pneumothorax. Stable opacification over the right midlung and right base. Stable hazy opacification over the left base/retrocardiac region. Remainder of the exam is unchanged. IMPRESSION: 1. Stable opacification over the right midlung and bibasilar regions. 2.  Stable tiny right-sided pneumothorax. 3.  Tubes and lines as described. Electronically Signed   By: Marin Olp M.D.   On: 08/31/2019 07:15   Dg Chest Port 1 View  Result Date: 08/30/2019 CLINICAL DATA:  Pneumothorax on right EXAM: PORTABLE CHEST 1 VIEW COMPARISON:  Chest radiograph 08/26/2019, 08/23/2019 FINDINGS: Stable cardiomediastinal contours. Unchanged for apparatus. Persistent small right  basilar pneumothorax with chest tube in place. Persistent infiltrates/atelectasis in the right mid upper lung and left base. No large pleural effusion. No acute finding in the visualized skeleton. IMPRESSION: Unchanged chest radiograph with persistent small right basilar pneumothorax and atelectasis/infiltrates in the right mid to upper lung as well as left base. Electronically Signed   By: Audie Pinto M.D.   On: 08/30/2019 09:42   Dg Abd Portable 1v  Result Date: 08/30/2019 CLINICAL DATA:  NG tube placement EXAM: PORTABLE ABDOMEN - 1 VIEW COMPARISON:  08/16/2019 FINDINGS: There is a nasogastric tube with the tip projecting over the stomach. There is no bowel dilatation to suggest obstruction. There is no evidence of pneumoperitoneum, portal venous gas or pneumatosis. There are no pathologic calcifications along the expected course of the ureters. The osseous structures are unremarkable. IMPRESSION: Nasogastric tube with the tip projecting over the stomach. Electronically Signed   By: Kathreen Devoid   On: 08/30/2019 15:56    Scheduled Meds: . sodium chloride   Intravenous Once  . aspirin  325 mg Per Tube Daily  .  atorvastatin  40 mg Oral q1800  . chlorhexidine  15 mL Mouth/Throat BID  . Chlorhexidine Gluconate Cloth  6 each Topical Daily  . darbepoetin (ARANESP) injection - NON-DIALYSIS  100 mcg Subcutaneous Q Wed-1800  . famotidine  20 mg Per Tube Daily  . feeding supplement (PRO-STAT SUGAR FREE 64)  60 mL Per Tube BID  . Gerhardt's butt cream   Topical TID  . heparin injection (subcutaneous)  5,000 Units Subcutaneous Q8H  . insulin aspart  0-9 Units Subcutaneous Q4H  . mouth rinse  15 mL Mouth Rinse 10 times per day  . midodrine  10 mg Per Tube TID WC  . sodium zirconium cyclosilicate  10 g Oral Daily  . sucralfate  1 g Oral Q6H   Continuous Infusions: . sodium chloride Stopped (08/21/19 1920)  . sodium chloride 10 mL/hr at 08/31/19 1000  . sodium chloride    . sodium chloride    .   ceFAZolin (ANCEF) IV Stopped (08/30/19 2145)  . dextrose Stopped (08/16/19 1623)  . feeding supplement (VITAL 1.5 CAL) 1,000 mL (08/31/19 1330)  . phenylephrine (NEO-SYNEPHRINE) Adult infusion 40 mcg/min (08/31/19 1323)     LOS: 37 days   Marylu Lund, MD [facility] Pager On Amion  If 7PM-7AM, please contact night-coverage 08/31/2019, 2:35 PM

## 2019-08-31 NOTE — Progress Notes (Signed)
Patient ID: [name], male   DOB: 1951-09-17, 68 y.o.   MRN: [id number] S: remains on pressors. 1L off on HD yesterday. 28- 35% FiO2 w/ trach collar.  O:BP (!) 142/73   Pulse 76   Temp 99 F (37.2 C) (Oral)   Resp 17   Ht 5\' 10"  (1.778 m)   Wt 78.9 kg   SpO2 99%   BMI 24.96 kg/m   Intake/Output Summary (Last 24 hours) at 08/31/2019 2144 Last data filed at 08/31/2019 2000 Gross per 24 hour  Intake 3391.98 ml  Output 150 ml  Net 3241.98 ml   Intake/Output: I/O last 3 completed shifts: In: 5759.2 [I.V.:2695; Blood:315; Other:60; NG/GT:2260.8; IV Piggyback:428.3] Out: 1200 [Other:1000; Stool:100; Chest Tube:100]  Intake/Output this shift:  Total I/O In: 125 [I.V.:70; NG/GT:55] Out: -  Weight change:    Exam:   Trach collar   L IJ temp cath   No jvd   Chest clear ant / lat, R chest tube in place    Cor reg no RG     Abd soft ntnd no ascites     Ext 2+ bilat UE edema, no LE edema    Assessment/Plan:   AKI/CKD stage 3- in setting of covid PNA and has remained oliguric/anuric. Started on CVVHD 08/07/19-08/24/19. Came off pressors and transferred to [facility] for iHD. Back on pressors though at [facility].   Getting iHD, HD yest.   Sig elevated BUN likely due to hypercatabolic state  Continue HD 3x/ week TTS, may need 4x/ week  If cannot tolerate hemodialysis hemodynamically, or if unable to control BUN/ Creat, will resume CVVHD  Per neuro, he needs to keep his SBP >120 for perfusion following his recent strokes therefore is on neo gtt at 35- 50 mcg/min  COVID PNA- treated with remdesivir, dexamethasone, and convalescent plasma. Most recent covid test negative x 2 on 08/24/19 and 08/25/19  VDRF - off vent now , on trach collar per PCCM  Sepsis/shock- back on pressors  Right pneumothorax/emphyema/necrotic RLL- s/p chest tube with air leak. Per CT Surgery he is not a candidate for VATS, however concern that necrotic RLL is source of hypercatabolic state  New cerebral and  cerebellar strokes- per primary svc  A fib/RVR- per PCCM  GIB- acute s/p EGD which showed gastric ulcer with stigmata of recent bleeding s/p injection and clipping. Able to tolerate low dose heparin with CVVHD before stopping it  MSSA bacteremia- treated  Acinetobacter and pseudomonal pna- sp abx course  Rob Jonnie Finner, MD 08/30/2019, 4:45 PM   Recent Labs  Lab 08/25/19 0517 08/26/19 0724 08/27/19 0336 08/28/19 0409 08/29/19 0528 08/30/19 0553 08/31/19 0800  NA 138 140 139 139 136 136 135  K 4.9 6.2* 4.8 6.2* 5.3* 5.7* 5.3*  CL 103 105 101 101 99 98 99  CO2 22 19* 21* 19* 23 21* 20*  GLUCOSE 123* 139* 117* 124* 115* 121* 125*  BUN 100* 145* 91* 146* 83* 136* 118*  CREATININE 4.02* 6.43* 4.44* 6.61* 4.21* 6.09* 5.24*  ALBUMIN  --  2.1*  --   --   --   --   --   CALCIUM 9.5 9.6 9.3 9.4 8.5* 8.8* 8.6*  PHOS 5.7* 9.5*  --   --   --  9.7*  --    Liver Function Tests: Recent Labs  Lab 08/26/19 0724  ALBUMIN 2.1*   No results for input(s): LIPASE, AMYLASE in the last 168 hours. No results for input(s): AMMONIA in the last 168  hours. CBC: Recent Labs  Lab 08/27/19 0336 08/28/19 0409 08/29/19 0528 08/30/19 0553 08/31/19 0800 08/31/19 1315  WBC 8.3 7.7 8.7 9.0 8.8  --   NEUTROABS 6.1 5.7 6.6 6.9  --   --   HGB 7.8* 7.9* 7.4* 7.2* 6.2* 6.1*  HCT 25.0* 25.4* 24.2* 23.3* 20.1* 19.3*  MCV 94.3 93.4 96.0 95.5 95.7  --   PLT 272 310 312 361 328  --    Cardiac Enzymes: No results for input(s): CKTOTAL, CKMB, CKMBINDEX, TROPONINI in the last 168 hours. CBG: Recent Labs  Lab 08/31/19 0440 08/31/19 0904 08/31/19 1205 08/31/19 1613 08/31/19 1935  GLUCAP 129* 120* 107* 129* 114*    Iron Studies: No results for input(s): IRON, TIBC, TRANSFERRIN, FERRITIN in the last 72 hours. Studies/Results: Dg Chest Port 1 View  Result Date: 08/31/2019 CLINICAL DATA:  COVID-19 positive.  Tracheostomy tube. EXAM: PORTABLE CHEST 1 VIEW COMPARISON:  08/30/2019 FINDINGS: Tracheostomy  tube in adequate position. Enteric tube courses into the stomach and off the film as tip is not visualized. Right medial basilar chest tube unchanged. Left IJ central venous catheter horizontally oriented over the SVC unchanged. Lungs are hypoinflated with persistent tiny right-sided pneumothorax. Stable opacification over the right midlung and right base. Stable hazy opacification over the left base/retrocardiac region. Remainder of the exam is unchanged. IMPRESSION: 1. Stable opacification over the right midlung and bibasilar regions. 2.  Stable tiny right-sided pneumothorax. 3.  Tubes and lines as described. Electronically Signed   By: Marin Olp M.D.   On: 08/31/2019 07:15   Dg Chest Port 1 View  Result Date: 08/30/2019 CLINICAL DATA:  Pneumothorax on right EXAM: PORTABLE CHEST 1 VIEW COMPARISON:  Chest radiograph 08/26/2019, 08/23/2019 FINDINGS: Stable cardiomediastinal contours. Unchanged for apparatus. Persistent small right basilar pneumothorax with chest tube in place. Persistent infiltrates/atelectasis in the right mid upper lung and left base. No large pleural effusion. No acute finding in the visualized skeleton. IMPRESSION: Unchanged chest radiograph with persistent small right basilar pneumothorax and atelectasis/infiltrates in the right mid to upper lung as well as left base. Electronically Signed   By: Audie Pinto M.D.   On: 08/30/2019 09:42   Dg Abd Portable 1v  Result Date: 08/30/2019 CLINICAL DATA:  NG tube placement EXAM: PORTABLE ABDOMEN - 1 VIEW COMPARISON:  08/16/2019 FINDINGS: There is a nasogastric tube with the tip projecting over the stomach. There is no bowel dilatation to suggest obstruction. There is no evidence of pneumoperitoneum, portal venous gas or pneumatosis. There are no pathologic calcifications along the expected course of the ureters. The osseous structures are unremarkable. IMPRESSION: Nasogastric tube with the tip projecting over the stomach. Electronically  Signed   By: Kathreen Devoid   On: 08/30/2019 15:56   . aspirin  325 mg Per Tube Daily  . atorvastatin  40 mg Oral q1800  . chlorhexidine  15 mL Mouth/Throat BID  . Chlorhexidine Gluconate Cloth  6 each Topical Daily  . darbepoetin (ARANESP) injection - NON-DIALYSIS  100 mcg Subcutaneous Q Wed-1800  . famotidine  20 mg Per Tube Daily  . feeding supplement (PRO-STAT SUGAR FREE 64)  60 mL Per Tube BID  . Gerhardt's butt cream   Topical TID  . heparin injection (subcutaneous)  5,000 Units Subcutaneous Q8H  . insulin aspart  0-9 Units Subcutaneous Q4H  . mouth rinse  15 mL Mouth Rinse 10 times per day  . midodrine  10 mg Per Tube TID WC  . sodium zirconium cyclosilicate  10  g Oral Daily  . sucralfate  1 g Oral Q6H    BMET    Component Value Date/Time   NA 135 08/31/2019 0800   NA 143 11/08/2017 1030   K 5.3 (H) 08/31/2019 0800   CL 99 08/31/2019 0800   CO2 20 (L) 08/31/2019 0800   GLUCOSE 125 (H) 08/31/2019 0800   BUN 118 (H) 08/31/2019 0800   BUN 8 11/08/2017 1030   CREATININE 5.24 (H) 08/31/2019 0800   CREATININE 1.15 11/20/2014 1224   CALCIUM 8.6 (L) 08/31/2019 0800   GFRNONAA 11 (L) 08/31/2019 0800   GFRAA 12 (L) 08/31/2019 0800   CBC    Component Value Date/Time   WBC 8.8 08/31/2019 0800   RBC 2.10 (L) 08/31/2019 0800   HGB 6.1 (LL) 08/31/2019 1315   HGB 15.8 11/08/2017 1030   HCT 19.3 (L) 08/31/2019 1315   HCT 46.6 11/08/2017 1030   PLT 328 08/31/2019 0800   PLT 202 11/08/2017 1030   MCV 95.7 08/31/2019 0800   MCV 93 11/08/2017 1030   MCH 29.5 08/31/2019 0800   MCHC 30.8 08/31/2019 0800   RDW 16.7 (H) 08/31/2019 0800   RDW 14.2 11/08/2017 1030   LYMPHSABS 0.8 08/30/2019 0553   LYMPHSABS 1.1 11/08/2017 1030   MONOABS 0.6 08/30/2019 0553   EOSABS 0.5 08/30/2019 0553   EOSABS 0.1 11/08/2017 1030   BASOSABS 0.0 08/30/2019 0553   BASOSABS 0.0 11/08/2017 1030

## 2019-09-01 ENCOUNTER — Inpatient Hospital Stay (HOSPITAL_COMMUNITY): Payer: Medicare HMO

## 2019-09-01 DIAGNOSIS — Z7189 Other specified counseling: Secondary | ICD-10-CM

## 2019-09-01 DIAGNOSIS — E8770 Fluid overload, unspecified: Secondary | ICD-10-CM | POA: Diagnosis not present

## 2019-09-01 DIAGNOSIS — K254 Chronic or unspecified gastric ulcer with hemorrhage: Secondary | ICD-10-CM | POA: Diagnosis not present

## 2019-09-01 DIAGNOSIS — I4891 Unspecified atrial fibrillation: Secondary | ICD-10-CM | POA: Diagnosis not present

## 2019-09-01 DIAGNOSIS — J1289 Other viral pneumonia: Secondary | ICD-10-CM | POA: Diagnosis not present

## 2019-09-01 DIAGNOSIS — N179 Acute kidney failure, unspecified: Secondary | ICD-10-CM | POA: Diagnosis not present

## 2019-09-01 DIAGNOSIS — D649 Anemia, unspecified: Secondary | ICD-10-CM | POA: Diagnosis not present

## 2019-09-01 DIAGNOSIS — J9600 Acute respiratory failure, unspecified whether with hypoxia or hypercapnia: Secondary | ICD-10-CM | POA: Diagnosis not present

## 2019-09-01 DIAGNOSIS — Z9689 Presence of other specified functional implants: Secondary | ICD-10-CM | POA: Diagnosis not present

## 2019-09-01 DIAGNOSIS — I6330 Cerebral infarction due to thrombosis of unspecified cerebral artery: Secondary | ICD-10-CM | POA: Diagnosis not present

## 2019-09-01 DIAGNOSIS — J939 Pneumothorax, unspecified: Secondary | ICD-10-CM | POA: Diagnosis not present

## 2019-09-01 DIAGNOSIS — Z930 Tracheostomy status: Secondary | ICD-10-CM | POA: Diagnosis not present

## 2019-09-01 DIAGNOSIS — A4901 Methicillin susceptible Staphylococcus aureus infection, unspecified site: Secondary | ICD-10-CM | POA: Diagnosis not present

## 2019-09-01 DIAGNOSIS — I959 Hypotension, unspecified: Secondary | ICD-10-CM | POA: Diagnosis not present

## 2019-09-01 DIAGNOSIS — Z515 Encounter for palliative care: Secondary | ICD-10-CM

## 2019-09-01 DIAGNOSIS — J9311 Primary spontaneous pneumothorax: Secondary | ICD-10-CM | POA: Diagnosis not present

## 2019-09-01 DIAGNOSIS — J80 Acute respiratory distress syndrome: Secondary | ICD-10-CM | POA: Diagnosis not present

## 2019-09-01 DIAGNOSIS — U071 COVID-19: Secondary | ICD-10-CM | POA: Diagnosis not present

## 2019-09-01 DIAGNOSIS — R579 Shock, unspecified: Secondary | ICD-10-CM | POA: Diagnosis not present

## 2019-09-01 DIAGNOSIS — E872 Acidosis: Secondary | ICD-10-CM | POA: Diagnosis not present

## 2019-09-01 LAB — CBC
HCT: 21.8 % — ABNORMAL LOW (ref 39.0–52.0)
HCT: 24.3 % — ABNORMAL LOW (ref 39.0–52.0)
HCT: 23.3 % — ABNORMAL LOW (ref 39.0–52.0)
HCT: 23.5 % — ABNORMAL LOW (ref 39.0–52.0)
Hemoglobin: 6.8 g/dL — CL (ref 13.0–17.0)
Hemoglobin: 7.6 g/dL — ABNORMAL LOW (ref 13.0–17.0)
Hemoglobin: 7.4 g/dL — ABNORMAL LOW (ref 13.0–17.0)
Hemoglobin: 7.6 g/dL — ABNORMAL LOW (ref 13.0–17.0)
MCH: 29.7 pg (ref 26.0–34.0)
MCH: 29 pg (ref 26.0–34.0)
MCH: 29.4 pg (ref 26.0–34.0)
MCH: 29.3 pg (ref 26.0–34.0)
MCHC: 31.2 g/dL (ref 30.0–36.0)
MCHC: 31.3 g/dL (ref 30.0–36.0)
MCHC: 31.8 g/dL (ref 30.0–36.0)
MCHC: 32.3 g/dL (ref 30.0–36.0)
MCV: 95.2 fL (ref 80.0–100.0)
MCV: 92.7 fL (ref 80.0–100.0)
MCV: 92.5 fL (ref 80.0–100.0)
MCV: 90.7 fL (ref 80.0–100.0)
Platelets: 357 10*3/uL (ref 150–400)
Platelets: 326 10*3/uL (ref 150–400)
Platelets: 341 10*3/uL (ref 150–400)
Platelets: 347 10*3/uL (ref 150–400)
RBC: 2.29 MIL/uL — ABNORMAL LOW (ref 4.22–5.81)
RBC: 2.62 MIL/uL — ABNORMAL LOW (ref 4.22–5.81)
RBC: 2.52 MIL/uL — ABNORMAL LOW (ref 4.22–5.81)
RBC: 2.59 MIL/uL — ABNORMAL LOW (ref 4.22–5.81)
RDW: 16.2 % — ABNORMAL HIGH (ref 11.5–15.5)
RDW: 17.1 % — ABNORMAL HIGH (ref 11.5–15.5)
RDW: 17.1 % — ABNORMAL HIGH (ref 11.5–15.5)
RDW: 17.1 % — ABNORMAL HIGH (ref 11.5–15.5)
WBC: 8.8 10*3/uL (ref 4.0–10.5)
WBC: 8.1 10*3/uL (ref 4.0–10.5)
WBC: 8.4 10*3/uL (ref 4.0–10.5)
WBC: 8.6 10*3/uL (ref 4.0–10.5)
nRBC: 0.8 % — ABNORMAL HIGH (ref 0.0–0.2)
nRBC: 0.7 % — ABNORMAL HIGH (ref 0.0–0.2)
nRBC: 0.6 % — ABNORMAL HIGH (ref 0.0–0.2)
nRBC: 0.5 % — ABNORMAL HIGH (ref 0.0–0.2)

## 2019-09-01 LAB — BASIC METABOLIC PANEL
Anion gap: 16 — ABNORMAL HIGH (ref 5–15)
BUN: 145 mg/dL — ABNORMAL HIGH (ref 8–23)
CO2: 20 mmol/L — ABNORMAL LOW (ref 22–32)
Calcium: 8.6 mg/dL — ABNORMAL LOW (ref 8.9–10.3)
Chloride: 98 mmol/L (ref 98–111)
Creatinine, Ser: 6.49 mg/dL — ABNORMAL HIGH (ref 0.61–1.24)
GFR calc Af Amer: 9 mL/min — ABNORMAL LOW (ref >60)
GFR calc non Af Amer: 8 mL/min — ABNORMAL LOW (ref >60)
Glucose, Bld: 118 mg/dL — ABNORMAL HIGH (ref 70–99)
Potassium: 5 mmol/L (ref 3.5–5.1)
Sodium: 134 mmol/L — ABNORMAL LOW (ref 135–145)

## 2019-09-01 LAB — PREPARE RBC (CROSSMATCH)

## 2019-09-01 LAB — GLUCOSE, CAPILLARY
Glucose-Capillary: 109 mg/dL — ABNORMAL HIGH (ref 70–99)
Glucose-Capillary: 120 mg/dL — ABNORMAL HIGH (ref 70–99)
Glucose-Capillary: 120 mg/dL — ABNORMAL HIGH (ref 70–99)
Glucose-Capillary: 98 mg/dL (ref 70–99)

## 2019-09-01 MED ORDER — PANTOPRAZOLE SODIUM 40 MG PO PACK
40.0000 mg | PACK | Freq: Two times a day (BID) | ORAL | Status: AC
  Administered 2019-09-01 – 2019-09-03 (×5): 40 mg
  Filled 2019-09-01 (×5): qty 20

## 2019-09-01 MED ORDER — SODIUM CHLORIDE 0.9% IV SOLUTION
Freq: Once | INTRAVENOUS | Status: AC
  Administered 2019-09-01: 05:00:00 via INTRAVENOUS

## 2019-09-01 MED ORDER — CHLORHEXIDINE GLUCONATE CLOTH 2 % EX PADS
6.0000 | MEDICATED_PAD | Freq: Every day | CUTANEOUS | Status: DC
  Administered 2019-09-02 – 2019-09-06 (×6): 6 via TOPICAL

## 2019-09-01 NOTE — Consult Note (Signed)
[place] Nurse wound follow up Patient receiving care in [facility]  3M04.  Wife in room.  Assisted with turning/positioning by primary RN. Wound type: Healed left upper lip wound. Healed penile wound(s). Healed sacral wound. Continue turning and repositioning and preventive measures for PI development. Val Riles, RN, MSN, CWOCN, CNS-BC, pager [phone]

## 2019-09-01 NOTE — Progress Notes (Signed)
NAME:  [name], MRN:  [id number], DOB:  [birth year], LOS: 37 ADMISSION DATE:  07/25/2019, CONSULTATION DATE:  10/3 REFERRING MD:  Nadara Mustard, CHIEF COMPLAINT:  Dyspnea   Brief History   68 y/o male diagnosed with COVID on 9/29 presented to the [facility] ED on 10/2 with dyspnea, hypoxemia requiring intubation.  Admitted to [facility], developed AKI requiring CRRT, pneumothorax requiring chest tube and had a large bronchopleural fistula treated for 10 days with a bronchial blocker.  Tracheostomy placed on 10/30.  Past Medical History  GERD  Significant Hospital Events   10/03 Admit to [facility] from [facility] ER, start decadron and remdesivir, given tociluzimab; prone positioning 10/04 convalescent plasma 10/05 stop prone positioning 10/06 convalescent plasma; prone positioning again 10/09 start ABx 10/10 MSSA bacteremia, CVL d/ced; ID consulted; increase OG tube outpt 10/11 vent weaning trial started 10/13 fever, pneumothorax, pig tail chest tube placed 10/14 worsening hypotension and renal fx >> consulted nephrology; worsening PTX >> replaced chest tube; GNR in sputum >> ABx changed 10/15 start CRRT; endobronchial blocker placed for persistent air leak 10/16 endobronchial blocker repositioned, changed chest tube to water seal; melana with ABLA >> GI consulted; transfuse PRBC 10/17 persistent air leak, increased WOB; start nimbex gtt >> air leak decreased; EGD; A fib with RVR >> start amiodarone 10/18 transfuse PRBC; GI s/o 10/20 resume heparin gtt 10/21 stopped nimbex, chest tube to suction, stopped heparin drip because Hgb dropped again 10/22 TPA in chest tube, repositioned endobronchial blocker 10/23 deflated endobronchial balloon 10/24 small air leak  10/25 removed bronchial blocker 10/30 tracheostomy  11/1 D/C CRRT 11/2 Transfer to [facility] 11/3 Started on IHD 11/7 trach sutures removed 11/4-present, weaning on ATC  Consults:  ID Nephrology Gastroenterology PCCM  Procedures:  10/2 R IJ CVL >>  10/10 10/13 Rt pig tail chest tube >> 10/14 10/14 R 43F chest tube >> 10/14 L IJ HD cath >> 10/30 tracheostomy >>   Significant Diagnostic Tests:  10/2 CT angiogram chest >> extensive bilateral airspace disease predominantly posterior 10/8 doppler legs b/l >> no DVT 10/15 renal u/s >> normal 10/17 EGD >> non bleeding gastric ulcer 10/19 Echo >> EF 60 to 65%, hyperdynamic LV with small LVOT gradient 10/22 CT chest >> large collection of air in pleural space with fluid, pneumonia bilaterally R>L endobronchial blocker in place, chest tube anterior 10/31 CXR >> persistent small right pneumothorax despite chest in adequate position MRI brain 11/3 >> small acute to subacute infarcts involving bilateral cerebral cortex and right cerebellum.  Large b/l mastoid effusions  Micro Data:  9/20 SARS COV 2 >> POSITIVE 10/2 blood >> negative 10/9 blood >> MSSA 2/4 10/9 resp >> MSSA, Pseudomonas 10/13 blood >> negative 10/21 bronch wash bacterial >> pseduomonas, acinetobacter baumannii 10/21 bronch wash fungal >  10/21 bronch wash aspergillus antigen> negative 10/24 bronchoscopy wash> pseudomonas 10/28 Blood cx>>> 10/28 Sputum cx>>> Pseudomonas 11/1 SARS COV 2 > negative  Antimicrobials:  10/3 remdesivir > 10/6 10/3 actemra 10/3 decadron > 10/12 10/4 convalescent plasma 10/6 convalescent plasma  10/9 vancomycin > 10/10 10/9 cefepime > 10/10 10/10 ancef > 10/13 10/13 cefepime >10/20 10/14 anidulofungin > 10/15  10/20 ancef > 10/23 10/23 meropenem > >>>off  10/21 anidulofungin> 10/22 10/22 voriconazole > 10/23 10/28 Merrem >>11/2 10/28 Vanc >>10/30  10/31 Ancef >> Tentative stop date 11/23   Interim history/subjective:  Drop in hgb to 6.8, I unit PRBC ordered, remains on ATC with slight pressor support. Able to follow simple commands. Palliative meeting scheduled today  Objective   Blood pressure (!) 143/69, pulse 85, temperature 98.8 F (37.1 C), temperature source Oral,  resp. rate 17, height 5\' 10"  (1.778 m), weight 78.9 kg, SpO2 98 %.    FiO2 (%):  [28 %-40 %] 28 %   Intake/Output Summary (Last 24 hours) at 09/01/2019 0730 Last data filed at 09/01/2019 0700 Gross per 24 hour  Intake 3366.89 ml  Output 150 ml  Net 3216.89 ml   Filed Weights   08/28/19 1625 08/30/19 1449 08/30/19 1859  Weight: 79.7 kg 79.9 kg 78.9 kg    Examination: General: Chronically ill appearing elderly male remains on ATC, in NAD HEENT: Trach midline, MM pink/moist, PERRL,  Neuro: Alert and slightly interactive, can follow simple commands, unable to grip left hand, very deconditioned  CV: s1s2 regular rate and rhythm, no murmur, rubs, or gallops,  PULM:  ATC, slight rhonchi bilaterally, right chest tube in place  GI: soft, bowel sounds active in all 4 quadrants, non-tender, non-distended, tolerating TF Extremities: warm/dry, generalized edema  Skin: no rashes or lesions  Assessment & Plan:   # Acute respiratory failure with hypoxemia - 2/2 COVID-19 (s/p remdsevir, actemra, convalescent plasma, steroids).  #Trach dependence - trach placed 10/30 # R pneumothorax - s/p chest tube placement.  Now with persistent airleak / BPF despite endobronchial blocker placement (removed 10/25 due to MSSA bacteremia).  # Neuromuscular weakness/encephalopathy # Persistent shock/vasoplegia # MSSA bacteremia - Echocardiogram negative.  # Bilateral cerebral cortex and right cerebellum small acute to subacute infarcts. # Anemia of chronic disease with GI bleed and gastric ulcer   Plan Continue on ATC with PMV trials Continue chest tube per CTS, remains on suction with air leak Continue Ancef with tentative stop date of 11/23 per ID Continue Midodrine, wean pressors as able  Trend CBC q6hrs, re-consult GI if drop in Hgb seen again Biggest barriers to dispo remains bronchopleural fistula and ICU weakness   Rest per primary team   Best practice:  Diet: tube feeding Pain/Anxiety/Delirium  protocol (if indicated): fentanyl for pain. VAP protocol (if indicated): yes DVT prophylaxis: sub cutaneous heparin GI prophylaxis: Pantoprazole for stress ulcer prophylaxis Glucose control: SSI Mobility: PT  Code Status: DNR if arrests Family Communication:  Disposition: ICU pending pressor liberation   Critical care :   Performed by: [name]   Total critical care time: 35 minutes  Critical care time was exclusive of separately billable procedures and treating other patients.  Critical care was necessary to treat or prevent imminent or life-threatening deterioration.  Critical care was time spent personally by me on the following activities: development of treatment plan with patient and/or surrogate as well as nursing, discussions with consultants, evaluation of patient's response to treatment, examination of patient, obtaining history from patient or surrogate, ordering and performing treatments and interventions, ordering and review of laboratory studies, ordering and review of radiographic studies, pulse oximetry and re-evaluation of patient's condition.   Signature:   [name], NP-C [facility] After hours pager: [phone]. 09/01/2019, 9:02 AM

## 2019-09-01 NOTE — Progress Notes (Signed)
23 Days Post-Op Procedure(s) (LRB): ESOPHAGOGASTRODUODENOSCOPY (EGD) (N/A) HEMOSTASIS CONTROL HEMOSTASIS CLIP PLACEMENT Subjective: Alert and interactive  Objective: Vital signs in last 24 hours: Temp:  [97.8 F (36.6 C)-99.6 F (37.6 C)] 99.1 F (37.3 C) (11/09 0750) Pulse Rate:  [73-108] 99 (11/09 1107) Cardiac Rhythm: Normal sinus rhythm (11/09 0800) Resp:  [16-30] 24 (11/09 1107) BP: (100-159)/(56-92) 127/66 (11/09 1100) SpO2:  [93 %-100 %] 94 % (11/09 1107) FiO2 (%):  [28 %-40 %] 28 % (11/09 1107)  Hemodynamic parameters for last 24 hours:    Intake/Output from previous day: 11/08 0701 - 11/09 0700 In: 3366.9 [I.V.:1526.9; Blood:315; NG/GT:1475; IV Piggyback:50] Out: 150 [Stool:100; Chest Tube:50] Intake/Output this shift: Total I/O In: 375.5 [I.V.:50.5; Blood:325] Out: -   General appearance: alert and no distress Lungs: rhonchi bilaterally  Lab Results: Recent Labs    09/01/19 0356 09/01/19 0842  WBC 8.8 8.1  HGB 6.8* 7.6*  HCT 21.8* 24.3*  PLT 357 326   BMET:  Recent Labs    08/31/19 0800 09/01/19 0356  NA 135 134*  K 5.3* 5.0  CL 99 98  CO2 20* 20*  GLUCOSE 125* 118*  BUN 118* 145*  CREATININE 5.24* 6.49*  CALCIUM 8.6* 8.6*    PT/INR: No results for input(s): LABPROT, INR in the last 72 hours. ABG    Component Value Date/Time   PHART 7.401 08/23/2019 0453   HCO3 25.7 08/23/2019 0453   TCO2 27 08/23/2019 0453   ACIDBASEDEF 2.0 08/14/2019 0515   O2SAT 93.0 08/23/2019 0453   CBG (last 3)  Recent Labs    09/01/19 0004 09/01/19 0350 09/01/19 0757  GLUCAP 120* 109* 120*    Assessment/Plan: S/P Procedure(s) (LRB): ESOPHAGOGASTRODUODENOSCOPY (EGD) (N/A) HEMOSTASIS CONTROL HEMOSTASIS CLIP PLACEMENT -Prolonged air leak secondary to BPF His air leak is down significantly from what it was last week. Would continue with Ct to suction for now Consider change to water seal later this week   LOS: 38 days    [name] 09/01/2019

## 2019-09-01 NOTE — Progress Notes (Signed)
PROGRESS NOTE    [name]  POE:[id number] DOB: [birth year] DOA: 07/25/2019 PCP: Kathyrn Drown, MD    Brief Narrative:  68 year old with a history of GERD and BPH who presented to [facility], ED with S OB and was found to be hypoxic. He was initially diagnosed with COVID-19 September 20. 1 to 2 days prior to his presentation he developed worsening shortness of breath with anorexia. His wife's physician during a teleconference call noted that the patient himself is very tachypneic and ultimately EMS was sent to the house. EMS found the patient to have saturations in the 40s. In the ED on nonrebreather the patient sats only improved to the 80s. The patient was severely confused and required emergent intubation. Chest x-ray confirmed multifocal infiltrates. CT chest was negative for PE  Assessment & Plan:   Principal Problem:   Pneumonia due to COVID-19 virus Active Problems:   BPH (benign prostatic hyperplasia)   Acute respiratory failure due to COVID-19 (HCC)   AKI (acute kidney injury) (HCC)   Fluid overload   Pneumothorax on right   Acute renal failure (ARF) (HCC)   GI bleed   Atrial fibrillation with RVR (HCC)   Constipation   Gastric ulcer with hemorrhage   Acute respiratory failure (HCC)   Chest tube in place   Primary spontaneous pneumothorax   Acute respiratory distress syndrome (ARDS) due to COVID-19 virus ([place])   Tracheostomy status (HCC)   Cerebral thrombosis with cerebral infarction   Acute Hypoxic Resp. Failure/Pneumonia due to COVID-19 -2 negative COVID-19 test, last on 08/25/2019 -Patient underwent tracheostomy on 10/30, somewhat still vent dependent Intermittent trach collar -Completed course of remdesivir and steroids -Patient also received Actemra and convalescent plasma -Continue pulmonary toilet/trach care per protocol -PCCM on board for assistance with vent management -Not candidate for valve or blocker per PCCM. Cont vent mgt per PCCM   Recent Labs       Lab Results  Component Value Date   SARSCOV2NAA NEGATIVE 08/25/2019   [place] NEGATIVE 08/24/2019   [place] Detected (A) 07/22/2019     COVID-19 specific Treatment: Decadron 10/2 > 10/12 Remdesivir 10/2 > 10/6 Actemra 10/2 Convalescent plasma 10/3  Encephalopathy/generalized weakness/deconditioned, acute CVA -Recent MRI performed and reviewed, findings of bilateral infarcts -Appreciate input by Stroke team. Recommendation for ASA, EEG, permissive HTN. Also to start statin when able to -Discussed with Dr. Erlinda Hong today. Recommendation to avoid hypotension, especially during HD -Continue PT/OT as tolerated -Neurology not recommending anticoagulation given hx GI bleeding -Patient remains on pressor support to maintain appropriate BP, per below -Overall poor prognosis . See note from 11/7. Family is agreeable to [place] discussion with Palliative Care.  -Appreciate input by Palliative Care. Planned for family meeting today.   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 Air leak improved -CTS recommendation to continue with chest tube with suction  MSSA bacteremia/septic shock -Currently afebrile, with no leukocytosis -2 out of 2 blood cultures positive on 10/9, repeat BC have been negative, last on 08/20/19 NGTD -Echo done on 08/11/2019 was negative -Concern remains for recurrent bacteremia and possible endocarditis, ID recommended Cefazolin to be continued for 24 days from 10/31 -presently  remains stable  Pseudomonas and Acinetobacter in BAL specimen -Patient was started on  meropenem on 10/23 for 10-day treatment -Completed course of abx on 08/25/19  AKI on CKD stage 3 -Remains anuric, HD dependent -Status post CRRT, last done on 11/1 -Initiated HD on 08/26/2019 -Nephrology continues to follow for HD. Remains on pressors to maintain adequate perfusion given recent CVA  Acute GI bleed secondary to gastric ulcer -S/p EGD with clipping of vessel and large ulcer -H. pylori antibody was noted to be elevated -[facility] had been following -Continue PPI -Patient will need 2 weeks treatment for H. pylori when he is ready for discharge -Anticipate f/u with [facility] gastroenterology when discharged ultimately  Acute blood loss anemia -Likely secondary to above -Patient has been transfused with 5 units of PRBC so far during this hospitalization Last transfused with 2 units on 10/28 -This AM, pt noted to have hgb down to 6.2, repeat confirms hgb 6.1 -Given 1 unit PRBC transfusion 11/8 with another 11/9 -Follow CBC trend  Elevated D-dimer -No evidence for DVT on Dopplers on 10/8 -Thought to be related to suspected empyema in the right lung -Remains stable at this time  New onset paroxysmal atrial fibrillation with RVR -Heart rate controlled, currently sinus rhythm -Atrial fibrillation likely occurred in the setting of acute illness -Unable to fully anticoagulate due to GI bleeding -See above, acute stroke identified. Have consulted Neurology. Appreciate input. Recommendation to continue ASA. Unable to tolerate anticoagulation secondary to hx of transfusion dependent GI bleeding  Pressure injuries Pressure Injury 08/16/19 Lip Medial;Lower Stage I -  Intact skin with non-blanchable redness of a localized area usually over a bony prominence. healing  (Active)  08/16/19 2000  Location: Lip  Location Orientation: Medial;Lower  Staging: Stage I -  Intact skin with non-blanchable redness of a localized area usually over a bony prominence.  Wound Description  (Comments): healing   Present on Admission: No     Pressure Injury 08/16/19 Lip Lower;Mid Stage I -  Intact skin with non-blanchable redness of a localized area usually over a bony prominence. healing  (Active)  08/16/19 2342  Location: Lip  Location Orientation: Lower;Mid  Staging: Stage I -  Intact skin with non-blanchable redness of a localized area usually over a bony prominence.  Wound Description (Comments): healing   Present on Admission:      Pressure Injury 08/24/19 Buttocks Right;Left;Mid Deep Tissue Injury - Purple or maroon localized area of discolored intact skin or blood-filled blister due to damage of underlying soft tissue from pressure and/or shear. (Active)  08/24/19 0800  Location: Buttocks  Location Orientation: Right;Left;Mid  Staging: Deep Tissue Injury - Purple or maroon localized area of discolored intact skin or blood-filled blister due to damage of underlying soft tissue from pressure and/or shear.  Wound Description (Comments):   Present on Admission: No    DVT prophylaxis: SCD's Code Status: DNR Family Communication: Pt in room, wife at bedside Disposition Plan: Uncertain at this time  Consultants:  Pulmonology.  Nephrology.  Gastroenterology., Neurology, Palliative Care  Procedures:     Antimicrobials: Anti-infectives (From admission, onward)   Start     Dose/Rate Route Frequency Ordered Stop   08/26/19 2200  ceFAZolin (ANCEF) IVPB 1 g/50 mL premix     1 g 100 mL/hr over 30 Minutes Intravenous Every 24 hours 08/24/19 1021 09/15/19 2359   08/24/19 1100  meropenem (MERREM) 1 g in sodium chloride 0.9 % 100 mL IVPB     1 g 200 mL/hr  over 30 Minutes Intravenous Every 24 hours 08/24/19 1021 08/25/19 1156   08/23/19 1100  ceFAZolin (ANCEF) IVPB 2g/100 mL premix  Status:  Discontinued     2 g 200 mL/hr over 30 Minutes Intravenous Every 12 hours 08/23/19 1048 08/24/19 1021   08/21/19 1700  vancomycin (VANCOCIN) IVPB 750 mg/150 ml premix  Status:   Discontinued     750 mg 150 mL/hr over 60 Minutes Intravenous Every 24 hours 08/21/19 0843 08/23/19 1048   08/21/19 1400  vancomycin (VANCOCIN) IVPB 750 mg/150 ml premix  Status:  Discontinued     750 mg 150 mL/hr over 60 Minutes Intravenous Every 24 hours 08/20/19 1146 08/20/19 1218   08/21/19 1000  meropenem (MERREM) 1 g in sodium chloride 0.9 % 100 mL IVPB  Status:  Discontinued     1 g 200 mL/hr over 30 Minutes Intravenous Every 12 hours 08/20/19 1535 08/23/19 1048   08/21/19 0413  meropenem (MERREM) 500 mg in sodium chloride 0.9 % 100 mL IVPB  Status:  Discontinued     500 mg 200 mL/hr over 30 Minutes Intravenous Every 24 hours 08/20/19 1245 08/20/19 1535   08/20/19 1330  vancomycin (VANCOCIN) 1,750 mg in sodium chloride 0.9 % 500 mL IVPB     1,750 mg 250 mL/hr over 120 Minutes Intravenous  Once 08/20/19 1222 08/20/19 1619   08/20/19 1300  vancomycin (VANCOCIN) 1,250 mg in sodium chloride 0.9 % 250 mL IVPB  Status:  Discontinued     1,250 mg 166.7 mL/hr over 90 Minutes Intravenous  Once 08/20/19 1146 08/20/19 1218   08/20/19 1224  vancomycin variable dose per unstable renal function (pharmacist dosing)  Status:  Discontinued      Does not apply See admin instructions 08/20/19 1225 08/21/19 1152   08/15/19 1600  meropenem (MERREM) 1 g in sodium chloride 0.9 % 100 mL IVPB  Status:  Discontinued     1 g 200 mL/hr over 30 Minutes Intravenous Every 12 hours 08/15/19 1543 08/20/19 1245   08/15/19 1000  voriconazole (VFEND) tablet 200 mg  Status:  Discontinued     200 mg Per Tube Every 12 hours 08/14/19 1148 08/15/19 1134   08/14/19 1600  anidulafungin (ERAXIS) 100 mg in sodium chloride 0.9 % 100 mL IVPB  Status:  Discontinued     100 mg 78 mL/hr over 100 Minutes Intravenous Every 24 hours 08/13/19 1645 08/14/19 1148   08/14/19 1300  voriconazole (VFEND) tablet 400 mg     400 mg Per Tube Every 12 hours 08/14/19 1148 08/14/19 2217   08/13/19 1700  anidulafungin (ERAXIS) 200 mg in sodium  chloride 0.9 % 200 mL IVPB     200 mg 78 mL/hr over 200 Minutes Intravenous  Once 08/13/19 1645 08/13/19 2030   08/13/19 0200  ceFAZolin (ANCEF) IVPB 2g/100 mL premix  Status:  Discontinued     2 g 200 mL/hr over 30 Minutes Intravenous Every 12 hours 08/11/19 1512 08/15/19 1543   08/12/19 0200  ceFEPIme (MAXIPIME) 2 g in sodium chloride 0.9 % 100 mL IVPB     2 g 200 mL/hr over 30 Minutes Intravenous Every 12 hours 08/11/19 1509 08/12/19 1341   08/07/19 2200  ceFEPIme (MAXIPIME) 2 g in sodium chloride 0.9 % 100 mL IVPB  Status:  Discontinued     2 g 200 mL/hr over 30 Minutes Intravenous Every 12 hours 08/07/19 1540 08/11/19 1509   08/07/19 1000  anidulafungin (ERAXIS) 100 mg in sodium chloride 0.9 % 100 mL IVPB  Status:  Discontinued     100 mg 78 mL/hr over 100 Minutes Intravenous Every 24 hours 08/06/19 0934 08/08/19 1203   08/07/19 0800  ceFEPIme (MAXIPIME) 2 g in sodium chloride 0.9 % 100 mL IVPB  Status:  Discontinued     2 g 200 mL/hr over 30 Minutes Intravenous Every 24 hours 08/06/19 1036 08/07/19 1540   08/06/19 1000  anidulafungin (ERAXIS) 200 mg in sodium chloride 0.9 % 200 mL IVPB    Note to Pharmacy: please   200 mg 78 mL/hr over 200 Minutes Intravenous Every 24 hours 08/06/19 0925 08/06/19 1600   08/06/19 0800  ceFEPIme (MAXIPIME) 2 g in sodium chloride 0.9 % 100 mL IVPB  Status:  Discontinued     2 g 200 mL/hr over 30 Minutes Intravenous Every 12 hours 08/06/19 0733 08/06/19 1036   08/02/19 2200  ceFAZolin (ANCEF) IVPB 2g/100 mL premix  Status:  Discontinued     2 g 200 mL/hr over 30 Minutes Intravenous Every 8 hours 08/02/19 1338 08/06/19 0733   08/02/19 1400  vancomycin (VANCOCIN) 1,250 mg in sodium chloride 0.9 % 250 mL IVPB  Status:  Discontinued     1,250 mg 166.7 mL/hr over 90 Minutes Intravenous Every 24 hours 08/01/19 1320 08/02/19 1332   08/01/19 1330  vancomycin (VANCOCIN) 2,000 mg in sodium chloride 0.9 % 500 mL IVPB     2,000 mg 250 mL/hr over 120 Minutes  Intravenous  Once 08/01/19 1158 08/01/19 1636   08/01/19 1300  ceFEPIme (MAXIPIME) 2 g in sodium chloride 0.9 % 100 mL IVPB  Status:  Discontinued     2 g 200 mL/hr over 30 Minutes Intravenous Every 12 hours 08/01/19 1158 08/02/19 1332   07/26/19 1600  remdesivir 100 mg in sodium chloride 0.9 % 250 mL IVPB     100 mg 500 mL/hr over 30 Minutes Intravenous Every 24 hours 07/26/19 0132 07/29/19 1823   07/26/19 0215  remdesivir 200 mg in sodium chloride 0.9 % 250 mL IVPB     200 mg 500 mL/hr over 30 Minutes Intravenous Once 07/26/19 0132 07/26/19 0520      Subjective: Unable to assess given current mentation  Objective: Vitals:   09/01/19 1107 09/01/19 1200 09/01/19 1300 09/01/19 1400  BP:  127/67 124/70 (!) 131/57  Pulse: 99 96 98 86  Resp: (!) 24 (!) 28 (!) 25 20  Temp:      TempSrc:      SpO2: 94% 96% 96% 98%  Weight:      Height:        Intake/Output Summary (Last 24 hours) at 09/01/2019 1440 Last data filed at 09/01/2019 1000 Gross per 24 hour  Intake 2852.81 ml  Output 150 ml  Net 2702.81 ml   Filed Weights   08/28/19 1625 08/30/19 1449 08/30/19 1859  Weight: 79.7 kg 79.9 kg 78.9 kg    Examination: General exam: Awake, laying in bed, in nad Respiratory system: Somewhat increased resp effort, no wheezing Cardiovascular system: regular rate, s1, s2 Gastrointestinal system: Soft, nondistended, positive BS Central nervous system: CN2-12 grossly intact, strength intact Extremities: Perfused, no clubbing Skin: Normal skin turgor, no notable skin lesions seen Psychiatry: Unable to assess given mental status  Data Reviewed: I have personally reviewed following labs and imaging studies  CBC: Recent Labs  Lab 08/27/19 0336 08/28/19 0409 08/29/19 0528 08/30/19 0553 08/31/19 0800 08/31/19 1315 08/31/19 2056 09/01/19 0356 09/01/19 0842  WBC 8.3 7.7 8.7 9.0 8.8  --   --  8.8 8.1  NEUTROABS 6.1 5.7 6.6 6.9  --   --   --   --   --   HGB 7.8* 7.9* 7.4* 7.2* 6.2* 6.1*  7.7* 6.8* 7.6*  HCT 25.0* 25.4* 24.2* 23.3* 20.1* 19.3* 24.4* 21.8* 24.3*  MCV 94.3 93.4 96.0 95.5 95.7  --   --  95.2 92.7  PLT 272 310 312 361 328  --   --  357 536   Basic Metabolic Panel: Recent Labs  Lab 08/26/19 0724  08/28/19 0409 08/29/19 0528 08/30/19 0553 08/31/19 0800 09/01/19 0356  NA 140   < > 139 136 136 135 134*  K 6.2*   < > 6.2* 5.3* 5.7* 5.3* 5.0  CL 105   < > 101 99 98 99 98  CO2 19*   < > 19* 23 21* 20* 20*  GLUCOSE 139*   < > 124* 115* 121* 125* 118*  BUN 145*   < > 146* 83* 136* 118* 145*  CREATININE 6.43*   < > 6.61* 4.21* 6.09* 5.24* 6.49*  CALCIUM 9.6   < > 9.4 8.5* 8.8* 8.6* 8.6*  MG  --   --   --   --  2.9*  --   --   PHOS 9.5*  --   --   --  9.7*  --   --    < > = values in this interval not displayed.   GFR: Estimated Creatinine Clearance: 11.6 mL/min (A) (by C-G formula based on SCr of 6.49 mg/dL (H)). Liver Function Tests: Recent Labs  Lab 08/26/19 0724  ALBUMIN 2.1*   No results for input(s): LIPASE, AMYLASE in the last 168 hours. No results for input(s): AMMONIA in the last 168 hours. Coagulation Profile: No results for input(s): INR, PROTIME in the last 168 hours. Cardiac Enzymes: No results for input(s): CKTOTAL, CKMB, CKMBINDEX, TROPONINI in the last 168 hours. BNP (last 3 results) No results for input(s): PROBNP in the last 8760 hours. HbA1C: No results for input(s): HGBA1C in the last 72 hours. CBG: Recent Labs  Lab 08/31/19 1613 08/31/19 1935 09/01/19 0004 09/01/19 0350 09/01/19 0757  GLUCAP 129* 114* 120* 109* 120*   Lipid Profile: No results for input(s): CHOL, HDL, LDLCALC, TRIG, CHOLHDL, LDLDIRECT in the last 72 hours. Thyroid Function Tests: No results for input(s): TSH, T4TOTAL, FREET4, T3FREE, THYROIDAB in the last 72 hours. Anemia Panel: No results for input(s): VITAMINB12, FOLATE, FERRITIN, TIBC, IRON, RETICCTPCT in the last 72 hours. Sepsis Labs: No results for input(s): PROCALCITON, LATICACIDVEN in the last  168 hours.  Recent Results (from the past 240 hour(s))  SARS CORONAVIRUS 2 (TAT 6-24 HRS) Nasopharyngeal Nasopharyngeal Swab     Status: None   Collection Time: 08/24/19  1:55 PM   Specimen: Nasopharyngeal Swab  Result Value Ref Range Status   SARS Coronavirus 2 NEGATIVE NEGATIVE Final    Comment: (NOTE) SARS-CoV-2 target nucleic acids are NOT DETECTED. The SARS-CoV-2 RNA is generally detectable in upper and lower respiratory specimens during the acute phase of infection. Negative results do not preclude SARS-CoV-2 infection, do not rule out co-infections with other pathogens, and should not be used as the sole basis for treatment or other patient management decisions. Negative results must be combined with clinical observations, patient history, and epidemiological information. The expected result is Negative. Fact Sheet for Patients: SugarRoll.be Fact Sheet for Healthcare Providers: https://www.woods-mathews.com/ This test is not yet approved or cleared by the [place] and  has been authorized  for detection and/or diagnosis of SARS-CoV-2 by FDA under an Emergency Use Authorization (EUA). This EUA will remain  in effect (meaning this test can be used) for the duration of the COVID-19 declaration under Section 56 4(b)(1) of the Act, 21 U.S.C. section 360bbb-3(b)(1), unless the authorization is terminated or revoked sooner. Performed at [facility], [place] [address] [place], [place] [address]   SARS CORONAVIRUS 2 (TAT 6-24 HRS) Nasopharyngeal Nasopharyngeal Swab     Status: None   Collection Time: 08/25/19 11:37 AM   Specimen: Nasopharyngeal Swab  Result Value Ref Range Status   SARS Coronavirus 2 NEGATIVE NEGATIVE Final    Comment: (NOTE) SARS-CoV-2 target nucleic acids are NOT DETECTED. The SARS-CoV-2 RNA is generally detectable in upper and lower respiratory specimens during the acute phase of infection. Negative  results do not preclude SARS-CoV-2 infection, do not rule out co-infections with other pathogens, and should not be used as the sole basis for treatment or other patient management decisions. Negative results must be combined with clinical observations, patient history, and epidemiological information. The expected result is Negative. Fact Sheet for Patients: SugarRoll.be Fact Sheet for Healthcare Providers: https://www.woods-mathews.com/ This test is not yet approved or cleared by the [place] FDA and  has been authorized for detection and/or diagnosis of SARS-CoV-2 by FDA under an Emergency Use Authorization (EUA). This EUA will remain  in effect (meaning this test can be used) for the duration of the COVID-19 declaration under Section 56 4(b)(1) of the Act, 21 U.S.C. section 360bbb-3(b)(1), unless the authorization is terminated or revoked sooner. Performed at [facility], [place] [address], [place], [place] [address]      Radiology Studies: Dg Chest Hunt 1 View  Result Date: 09/01/2019 CLINICAL DATA:  68 year old male with chest tube placement. COVID-19 positive. EXAM: PORTABLE CHEST 1 VIEW COMPARISON:  Chest radiograph dated 08/31/2019 and chest CT dated 08/13/2019 FINDINGS: Support apparatus in stable position. No significant interval change in the appearance of the lungs and bilateral confluent densities predominantly involving the peripheral/subpleural regions as well as bibasilar lungs since the prior radiograph. Right lung base cystic or bolus area appears similar to prior radiograph. No large or detectable pneumothorax. Faint linear density over the right apex appears similar to prior radiograph, likely artifactual and less likely represents pleural reflection. Stable cardiac silhouette. No acute osseous pathology. IMPRESSION: 1. No significant interval change in the appearance of the lungs compared to the prior radiograph. 2.  Support apparatus in stable position. Electronically Signed   By: Anner Crete M.D.   On: 09/01/2019 09:55   Dg Chest Port 1 View  Result Date: 08/31/2019 CLINICAL DATA:  COVID-19 positive.  Tracheostomy tube. EXAM: PORTABLE CHEST 1 VIEW COMPARISON:  08/30/2019 FINDINGS: Tracheostomy tube in adequate position. Enteric tube courses into the stomach and off the film as tip is not visualized. Right medial basilar chest tube unchanged. Left IJ central venous catheter horizontally oriented over the SVC unchanged. Lungs are hypoinflated with persistent tiny right-sided pneumothorax. Stable opacification over the right midlung and right base. Stable hazy opacification over the left base/retrocardiac region. Remainder of the exam is unchanged. IMPRESSION: 1. Stable opacification over the right midlung and bibasilar regions. 2.  Stable tiny right-sided pneumothorax. 3.  Tubes and lines as described. Electronically Signed   By: Marin Olp M.D.   On: 08/31/2019 07:15   Dg Abd Portable 1v  Result Date: 08/30/2019 CLINICAL DATA:  NG tube placement EXAM: PORTABLE ABDOMEN - 1 VIEW COMPARISON:  08/16/2019 FINDINGS: There is a nasogastric tube with the tip projecting over the stomach. There is no bowel dilatation to suggest obstruction. There is no evidence of pneumoperitoneum, portal venous gas or pneumatosis. There are no pathologic calcifications along the expected course of the ureters. The osseous structures are unremarkable. IMPRESSION: Nasogastric tube with the tip projecting over the stomach. Electronically Signed   By: Kathreen Devoid   On: 08/30/2019 15:56    Scheduled Meds: . aspirin  325 mg Per Tube Daily  . atorvastatin  40 mg Oral q1800  . chlorhexidine  15 mL Mouth/Throat BID  . Chlorhexidine Gluconate Cloth  6 each Topical Daily  . [START ON 09/02/2019] Chlorhexidine Gluconate Cloth  6 each Topical Q0600  . darbepoetin (ARANESP) injection - NON-DIALYSIS  100 mcg Subcutaneous Q Wed-1800  .  feeding supplement (PRO-STAT SUGAR FREE 64)  60 mL Per Tube BID  . Gerhardt's butt cream   Topical TID  . mouth rinse  15 mL Mouth Rinse 10 times per day  . midodrine  10 mg Per Tube TID WC  . pantoprazole sodium  40 mg Per Tube BID  . sodium zirconium cyclosilicate  10 g Oral Daily  . sucralfate  1 g Oral Q6H   Continuous Infusions: . sodium chloride Stopped (09/01/19 0000)  .  ceFAZolin (ANCEF) IV Stopped (08/31/19 2310)  . dextrose Stopped (08/16/19 1623)  . feeding supplement (VITAL 1.5 CAL) 1,000 mL (09/01/19 0600)  . phenylephrine (NEO-SYNEPHRINE) Adult infusion 10 mcg/min (09/01/19 1000)     LOS: 38 days   Marylu Lund, MD [facility] Pager On Amion  If 7PM-7AM, please contact night-coverage 09/01/2019, 2:40 PM

## 2019-09-01 NOTE — Consult Note (Signed)
Consultation Note Date: 09/01/2019   Patient Name: [name]  DOB: Sep 15, 1951  MRN: [id number]  Age / Sex: 68 y.o., male   PCP: Kathyrn Drown, MD Referring Physician: Donne Hazel, MD   REASON FOR CONSULTATION:Establishing goals of care  Palliative Care consult requested for this 68 y.o. male with a medical history of GERD. He was admitted initially on 07/25/19 at [facility] (transferred to [facility]) with complaints of shortness of breath after being diagnosed with COVID on 07/13/19. He completed his treatment course of remdesivir, decadron, and convalescent plasma in addition to requiring intubation for respiratory support and IV antibiotics for possible pneumonia. He was found to have MSSA bacteremia on 08/02/19 and started on Ancef. During his hospital course he developed a pneumothorax requiring pig tail chest tube (10/13), developed worsening hypotension and renal failure (10/14) started on CRRT (10/15) later transitioning to iHD (11/3). [name] required a tracheostomy on 10/30 and have been able to wean on ATC (11/4). MRI on 11/3 showed small acute to subacute infarcts involving cerebral cortex and right cerebellum and large bilateral mastoid effusions. He remains on pressor support. Palliative Medicine team consulted for goals of care  Clinical Assessment and Goals of Care: I have reviewed medical records including lab results, imaging, Epic notes, and MAR, received report from the bedside RN, and assessed the patient. I met at the bedside with with wife, Cassady Turano and we called his 2 children Dian Situ and Dewayne Shorter and also his God-daughter Timita via face-time to discuss diagnosis prognosis, GOC, EOL wishes, disposition and options. [name] remains on ATC, he is alert, and will follow simple commands. He will nod his head to yes and no questions.    RN Drue Dun also involved in meeting.   I introduced Palliative Medicine to family as specialized medical care for people living with  serious illness. It focuses on providing relief from the symptoms and stress of a serious illness. The goal is to improve quality of life for both the patient and the family. Family verbalized understanding of Palliative's role.   We discussed a brief life review of the patient, along with his functional and nutritional status. 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 He and his wife have been married for over 32 years and are high school sweethearts. They have 2 sons and a Goddaughter who they acknowledges as if she was their biological daughter. He enjoyed golfing, music, and socializing with his friends and family. He is of [place] of faith and was heavily involved in his church including singing on the choir.   Family shares that [name] was healthy prior to his COVID diagnosis. Wife reports he only took medications for indigestion and worked out 3-4 times per week lifting weights and walking. He was independent with no health or functional concerns.   We discussed His current illness and what it means in the larger context of His on-going co-morbidities. With specific discussions regarding his multiorgan failure (renal failure, CVA, hypotension, respiratory failure requiring trach) and his overall prognosis. Natural disease trajectory and expectations at EOL were discussed.  We discussed at length [name] and current challenges as the medical team cares for him. Family tearful and open in expressing their strong faith and hopes of healing from God! They shared their feelings on the difficulty of seeing and knowing his critical state of health in comparison to his normal healthy state of health  over 2 months ago. Therapeutic listening and support given.   Wife shares their experience and concerns that she felt he contracted COVID on the golf [place] because that was the only place he had went and how he was so diligence in trying to  prevent contracting the virus. She reports having to call his provider and noticing his change in breathing prompting her to call 911 although he continued to state he was ok.   His son states "I have been waiting for this conversation to happen. I was worried when it would actually come!" Other children and wife also verbalized similar feelings. Support given.   Family all mutually shared awareness of their father/husband's value on quality of life and his importance to be able to golf, socialize, and be his normal self. They report he did not have a written advanced directive but had expressed to them on occassions his wishes not to be prolonged, or if he was in a state he could not care for himself or do for himself that he would not wish to live that way. Family tearful in sharing their feelings based on their known wishes of [name] stating he would not want to be in his current state for a long period of time. Therapeutic listening and support provided.   I used this opportunity to discuss expressed values and goals of care important to patient as mentioned by family. We discussed knowing [name] wishes and with updates on his prognosis and all care being the opposite of what he would want or how he would want to live focusing on his quality of life and centering decisions based on their knowledge of this. I also discussed with family best case and worst case scenario with best case centered around ability to discharge from the hospital but requiring long-term care and again significant difference in quality of life compared to months ago. Family verbalized understanding expressing they do not want to be selfish although they do not wish to lose him either! Support given. Again referencing the difficulty of knowing he was normal and now is fighting for his life.   Wife tearful. Silence and emotions allowed with comfort and support given. Children all providing support via face time expressing their  love for their mother and acknowledging that they want her to show emotions as she has held them in all these weeks! All children expressing they have been dealing with their own emotions and coming to terms of what is going on and the possibility of losing their father.   [name] is thankful for the support, sharing she has not had an emotional moment since he was at [facility] and was told that he had less than a 10% survival. She shares she was emotional and relied on God at that time, making note that he has survived for over 4 additional weeks now which has increased her hope and faith. She also shares her now emotional state and relying on God as she feels she is now losing hope again hearing all the complications he has going on. She states "it is different when you see and you think about everything that is going on but to hear it in actual terms I understand from a provider hits home and makes it so much more real now and it is starting to hurt!" Support given.   The difference between aggressive medical intervention and comfort care was considered in light of the patient's goals of care. Family also continue to request  full aggressive care with exception of DNR. They wish to continue discussions amongst themselves, making note of their faith in God and his ability to work Oceanographer. Wife states "I will continue to pray and God will have his way!"   Family expressed appreciation of detailed discussion and updates.   Questions and concerns were addressed.  The family was encouraged to call with questions or concerns.  PMT will continue to support holistically.   SOCIAL HISTORY:     reports that he has never smoked. He has never used smokeless tobacco. He reports current alcohol use of about 2.0 standard drinks of alcohol per week. He reports that he does not use drugs.  CODE STATUS: DNR  ADVANCE DIRECTIVES: Balin Vandegrift (wife)    SYMPTOM MANAGEMENT: per attending   Palliative Prophylaxis:    Aspiration, Bowel Regimen, Eye Care, Frequent Pain Assessment, Oral Care and Turn Reposition  PSYCHO-SOCIAL/SPIRITUAL:  Support System: Family   Desire for further Chaplaincy support:YES  Additional Recommendations (Limitations, Scope, Preferences):  Full Scope Treatment   PAST MEDICAL HISTORY: Past Medical History:  Diagnosis Date   COVID-19    Heartburn     PAST SURGICAL HISTORY:  Past Surgical History:  Procedure Laterality Date   ACHILLES TENDON SURGERY Right 2012   COLONOSCOPY  2009   [facility]   ESOPHAGOGASTRODUODENOSCOPY N/A 12/25/2014   Procedure: ESOPHAGOGASTRODUODENOSCOPY (EGD);  Surgeon: Danie Binder, MD;  Location: [facility];  Service: Endoscopy;  Laterality: N/A;  830am   ESOPHAGOGASTRODUODENOSCOPY N/A 08/09/2019   Procedure: ESOPHAGOGASTRODUODENOSCOPY (EGD);  Surgeon: Ronald Lobo, MD;  Location: [facility];  Service: Endoscopy;  Laterality: N/A;  Patient is already sedated, so I do not anticipate need for further sedation.  Okay from my standpoint to do either at the bedside or in the operating room   HEMOSTASIS CLIP PLACEMENT  08/09/2019   Procedure: [place];  Surgeon: Ronald Lobo, MD;  Location: [facility];  Service: Endoscopy;;   HEMOSTASIS CONTROL  08/09/2019   Procedure: HEMOSTASIS CONTROL;  Surgeon: Ronald Lobo, MD;  Location: [facility] ENDOS[facility];  Service: Endoscopy;;  epi    KNEE SURGERY Left 1980's   none     WRIST SURGERY Left 1970's    ALLERGIES:  has No Known Allergies.   MEDICATIONS:  Current Facility-Administered Medications  Medication Dose Route Frequency Provider Last Rate Last Dose   0.9 %  sodium chloride infusion   Intravenous PRN Bonnielee Haff, MD   Stopped at 09/01/19 0000   acetaminophen (TYLENOL) solution 650 mg  650 mg Per Tube Q4H PRN Little Ishikawa, MD   650 mg at 08/31/19 2225   aspirin tablet 325 mg  325 mg Per Tube Daily Donne Hazel, MD   325 mg at 09/01/19 0902    atorvastatin (LIPITOR) tablet 40 mg  40 mg Oral q1800 Rosalin Hawking, MD   40 mg at 08/31/19 1703   ceFAZolin (ANCEF) IVPB 1 g/50 mL premix  1 g Intravenous Q24H Thayer Headings, MD   Stopped at 08/31/19 2310   chlorhexidine (PERIDEX) 0.12 % solution 15 mL  15 mL Mouth/Throat BID Little Ishikawa, MD   15 mL at 09/01/19 0800   Chlorhexidine Gluconate Cloth 2 % PADS 6 each  6 each Topical Daily Kristopher Oppenheim, DO   6 each at 09/01/19 0902   [START ON 09/02/2019] Chlorhexidine Gluconate Cloth 2 % PADS 6 each  6 each Topical Q0600 Roney Jaffe, MD       Darbepoetin Alfa (ARANESP) injection  100 mcg  100 mcg Subcutaneous Q Wed-1800 Roney Jaffe, MD   100 mcg at 08/27/19 1734   dextrose 10 % infusion   Intravenous Continuous PRN Cherene Altes, MD   Stopped at 08/16/19 1623   feeding supplement (PRO-STAT SUGAR FREE 64) liquid 60 mL  60 mL Per Tube BID Donne Hazel, MD   60 mL at 09/01/19 0902   feeding supplement (VITAL 1.5 CAL) liquid 1,000 mL  1,000 mL Per Tube Continuous Donne Hazel, MD 55 mL/hr at 09/01/19 0600 1,000 mL at 09/01/19 0600   Gerhardt's butt cream   Topical TID Bonnielee Haff, MD       heparin injection 1,000-6,000 Units  1,000-6,000 Units CRRT PRN Cherene Altes, MD   3,000 Units at 08/24/19 4098   MEDLINE mouth rinse  15 mL Mouth Rinse 10 times per day Kristopher Oppenheim, DO   15 mL at 09/01/19 1336   metoprolol tartrate (LOPRESSOR) injection 5-10 mg  5-10 mg Intravenous Q4H PRN Cherene Altes, MD   5 mg at 08/16/19 0432   midodrine (PROAMATINE) tablet 10 mg  10 mg Per Tube TID WC Candee Furbish, MD   10 mg at 09/01/19 1336   pantoprazole sodium (PROTONIX) 40 mg/20 mL oral suspension 40 mg  40 mg Per Tube BID Margaretha Seeds, MD   40 mg at 09/01/19 1191   phenylephrine (NEOSYNEPHRINE) 10-0.9 MG/250ML-% infusion  0-400 mcg/min Intravenous Titrated Donato Heinz, MD 15 mL/hr at 09/01/19 1000 10 mcg/min at 09/01/19 1000   pneumococcal 23 valent vaccine  (PNU-IMMUNE) injection 0.5 mL  0.5 mL Intramuscular Prior to discharge Little Ishikawa, MD       polyethylene glycol (MIRALAX / GLYCOLAX) packet 17 g  17 g Per Tube Daily PRN Juanito Doom, MD       sodium zirconium cyclosilicate (LOKELMA) packet 10 g  10 g Oral Daily Donato Heinz, MD   10 g at 09/01/19 0902   sucralfate (CARAFATE) 1 GM/10ML suspension 1 g  1 g Oral Q6H Buccini, Robert, MD   1 g at 09/01/19 1336    VITAL SIGNS: BP 124/70    Pulse 98    Temp 99.1 F (37.3 C) (Axillary)    Resp (!) 25    Ht '5\' 10"'$  (1.778 m)    Wt 78.9 kg    SpO2 96%    BMI 24.96 kg/m  Filed Weights   08/28/19 1625 08/30/19 1449 08/30/19 1859  Weight: 79.7 kg 79.9 kg 78.9 kg    Estimated body mass index is 24.96 kg/m as calculated from the following:   Height as of this encounter: '5\' 10"'$  (1.778 m).   Weight as of this encounter: 78.9 kg.  LABS: CBC:    Component Value Date/Time   WBC 8.1 09/01/2019 0842   HGB 7.6 (L) 09/01/2019 0842   HGB 15.8 11/08/2017 1030   HCT 24.3 (L) 09/01/2019 0842   HCT 46.6 11/08/2017 1030   PLT 326 09/01/2019 0842   PLT 202 11/08/2017 1030   Comprehensive Metabolic Panel:    Component Value Date/Time   NA 134 (L) 09/01/2019 0356   NA 143 11/08/2017 1030   K 5.0 09/01/2019 0356   CO2 20 (L) 09/01/2019 0356   BUN 145 (H) 09/01/2019 0356   BUN 8 11/08/2017 1030   CREATININE 6.49 (H) 09/01/2019 0356   CREATININE 1.15 11/20/2014 1224   ALBUMIN 2.1 (L) 08/26/2019 0724   ALBUMIN 4.6 11/08/2017 1030  Review of Systems  Unable to perform ROS: Acuity of condition   Physical Exam General: NAD, frail chronically-ill appearing Cardiovascular: regular rate and rhythm Pulmonary: ATC, R-sided chest tube, bilateral rhonchi Abdomen: soft, nontender, + bowel sounds GU: no suprapubic tenderness Extremities: no edema, no joint deformities Skin: no rashes, generalized edema  Neurological: opens eyes, alert, will follow simple commands, nods head,  thumbs up at wife   Prognosis: Guarded to Poor   Discharge Planning:  To Be Determined  Recommendations:  DNR/DNI-as confirmed by family  Continue with current/aggressive plan of care. Family remains hopeful but is also preparing for the worst.   Family expresses patient's known wishes not to be prolonged or would not want to live in current state as quality of life meant a lot to him, however, hard to let go knowing he was perfectly healthy prior to [place]. Will need continued support and guidance.   PMT will continue to support and follow.    Palliative Performance Scale: ATC/tube feeding                 Family expressed understanding and was in agreement with this plan.   Thank you for allowing the Palliative Medicine Team to assist in the care of this patient.  Time In: 1100 Time Out: 1205 Time Total: 65 min.   Visit consisted of counseling and education dealing with the complex and emotionally intense issues of symptom management and palliative care in the setting of serious and potentially life-threatening illness.Greater than 50%  of this time was spent counseling and coordinating care related to the above assessment and plan.  Signed by:  Alda Lea, AGPCNP-BC Palliative Medicine Team  Phone: [phone] Fax: [phone] Pager: [phone] Amion: Bjorn Pippin

## 2019-09-01 NOTE — Progress Notes (Signed)
[name] Progress Note Patient Name: [name] DOB: 30-Oct-1950 MRN: [id number]   Date of Service  09/01/2019  HPI/Events of Note  Anemia - Hgb = 6.8.   eICU Interventions  Will order: 1. Transfuse 1 unit PRBC.     Intervention Category Major Interventions: Other:  Sonal Dorwart Cornelia Copa 09/01/2019, 5:05 AM

## 2019-09-01 NOTE — TOC Progression Note (Signed)
Transition of Care [facility]) - Progression Note    Patient Details  Name: [name] MRN: [id number] Date of Birth: [birth year]  [facility]) CM/SW Contact  Bartholomew Crews, RN Phone Number: [phone] 09/01/2019, 10:12 AM  Clinical Narrative:    TOC following at a distance for transition needs. Chart reviewed. 10/30 - trach, now on trach collar. 11/1 - now covid neg. 11/3 - MRI brain displayed new infarct. IV neo. Hgb 6.8 today - 1 u PRBCs. Palliative meeting with family pending today.       Expected Discharge Plan and Services                                                 Social Determinants of Health (SDOH) Interventions    Readmission Risk Interventions No flowsheet data found.

## 2019-09-01 NOTE — Consult Note (Signed)
Chief Complaint: Patient was seen in consultation today for tunneled dialysis catheter placement Chief Complaint  Patient presents with   Shortness of Breath   at the request of Dr Mickel Crow   Supervising Physician: Daryll Brod  Patient Status: [facility] - In-pt  History of Present Illness: [name] is a 68 y.o. male   Covid PNA 07/22/19 ARDS AKI-- need CRRT- temp cath placed L IJ 10/14 by CCM Prolonged mech ventilation  Need for long term dialysis No renal recovery  Request made for tunneled dialysis catheter placement   Past Medical History:  Diagnosis Date   COVID-19    Heartburn     Past Surgical History:  Procedure Laterality Date   ACHILLES TENDON SURGERY Right 2012   COLONOSCOPY  2009   [facility]   ESOPHAGOGASTRODUODENOSCOPY N/A 12/25/2014   Procedure: ESOPHAGOGASTRODUODENOSCOPY (EGD);  Surgeon: Danie Binder, MD;  Location: [facility];  Service: Endoscopy;  Laterality: N/A;  830am   ESOPHAGOGASTRODUODENOSCOPY N/A 08/09/2019   Procedure: ESOPHAGOGASTRODUODENOSCOPY (EGD);  Surgeon: Ronald Lobo, MD;  Location: [facility];  Service: Endoscopy;  Laterality: N/A;  Patient is already sedated, so I do not anticipate need for further sedation.  Okay from my standpoint to do either at the bedside or in the operating room   HEMOSTASIS CLIP PLACEMENT  08/09/2019   Procedure: [place];  Surgeon: Ronald Lobo, MD;  Location: [facility];  Service: Endoscopy;;   HEMOSTASIS CONTROL  08/09/2019   Procedure: HEMOSTASIS CONTROL;  Surgeon: Ronald Lobo, MD;  Location: [facility];  Service: Endoscopy;;  epi    KNEE SURGERY Left 1980's   none     WRIST SURGERY Left 1970's    Allergies: Patient has no known allergies.  Medications: Prior to Admission medications   Medication Sig Start Date End Date Taking? Authorizing Provider  ondansetron (ZOFRAN ODT) 4 MG disintegrating tablet Take 1 tablet (4 mg total) by mouth every 8  (eight) hours as needed for nausea or vomiting. 07/21/19  Yes Kathyrn Drown, MD     Family History  Problem Relation Age of Onset   Colon cancer Neg Hx    Colon polyps Neg Hx     Social History   Socioeconomic History   Marital status: Married    Spouse name: Not on file   Number of children: Not on file   Years of education: bachelors   Highest education level: Not on file  Occupational History   Occupation: Scientist, clinical (histocompatibility and immunogenetics): [place]: Not on file   Food insecurity    Worry: Not on file    Inability: Not on file   Transportation needs    Medical: Not on file    Non-medical: Not on file  Tobacco Use   Smoking status: Never Smoker   Smokeless tobacco: Never Used  Substance and Sexual Activity   Alcohol use: Yes    Alcohol/week: 2.0 standard drinks    Types: 2 Shots of liquor per week    Comment: moderate   Drug use: No   Sexual activity: Not on file  Lifestyle   Physical activity    Days per week: Not on file    Minutes per session: Not on file   Stress: Not on file  Relationships   Social connections    Talks on phone: Not on file    Gets together: Not on file    Attends religious service: Not on file  Active member of club or organization: Not on file    Attends meetings of clubs or organizations: Not on file    Relationship status: Not on file  Other Topics Concern   Not on file  Social History Narrative   Not on file    Review of Systems: A 12 point ROS discussed and pertinent positives are indicated in the HPI above.  All other systems are negative.   Vital Signs: BP (!) 131/57    Pulse 86    Temp 99.1 F (37.3 C) (Axillary)    Resp 20    Ht 5\' 10"  (1.778 m)    Wt 173 lb 15.1 oz (78.9 kg)    SpO2 98%    BMI 24.96 kg/m   Physical Exam Constitutional:      Comments: Vent/trach No response   Skin:    General: Skin is warm and dry.  Psychiatric:     Comments: Wife at  bedside--- signed consent     Imaging: Dg Abd 1 View  Result Date: 08/09/2019 CLINICAL DATA:  ARDS from COVID-19. EXAM: ABDOMEN - 1 VIEW COMPARISON:  August 06, 2019 FINDINGS: No free air, portal venous gas, or pneumatosis identified on supine imaging. An NG tube terminates in the stomach. Foreign body projects over the midline of the lower pelvis. No other abnormalities. IMPRESSION: 1. The foreign body projecting over the pelvis may be on or in the patient. Recommend clinical correlation. 2. No other acute abnormalities. Electronically Signed   By: Dorise Bullion III M.D   On: 08/09/2019 18:50   Ct Chest Wo Contrast  Result Date: 08/13/2019 CLINICAL DATA:  COVID-19 positivity, follow-up patchy infiltrates EXAM: CT CHEST WITHOUT CONTRAST TECHNIQUE: Multidetector CT imaging of the chest was performed following the standard protocol without IV contrast. COMPARISON:  07/25/2019 FINDINGS: Cardiovascular: Examination is somewhat limited due to lack of IV contrast. Atherosclerotic calcifications of the thoracic aorta are seen. No cardiac enlargement is noted. No aneurysmal dilatation is seen. Right-sided central line is noted extending into the cavoatrial junction. Left jugular central line is noted as well. Mediastinum/Nodes: Endotracheal tube and gastric catheter are seen. A blocking catheter is noted within the right lower lobe bronchi stable from prior exams. Thoracic inlet appears within normal limits. No sizable mediastinal or hilar adenopathy is noted. The esophagus is within normal limits as visualized. Lungs/Pleura: The lungs demonstrate patchy infiltrates similar to that seen on prior CT although the degree of consolidation has improved in the interval from the prior exam. Persistent right lower lobe and left lower lobe consolidation is noted. Complicated pleural effusion is noted on the right. A considerable amount of air is noted within the pleural effusion as well as within the pleural space.  Chest tube is noted anteriorly. The air is likely related to the recent bronchoscopy and manipulation of the bronchial occlusion tube. Upper Abdomen: Visualized upper abdomen shows gastric catheter within the stomach as well as multiple ligation clips within the distal stomach. Musculoskeletal: No chest wall mass or suspicious bone lesions identified. IMPRESSION: Improved aeration within the lungs when compared with the prior CT examination. There remains consolidation within the lower lobes bilaterally worse on the right than the left. Complicated pleural effusion on the right is noted with chest tube in place. The air within the pleural space is likely related to the recent manipulation of the bronchial occlusion tube. Tubes and lines as described above. Aortic Atherosclerosis (ICD10-I70.0). Electronically Signed   By: Inez Catalina M.D.   On:  08/13/2019 15:32   Mr Brain Wo Contrast  Result Date: 08/26/2019 CLINICAL DATA:  Altered level of consciousness. History of COVID-19. EXAM: MRI HEAD WITHOUT CONTRAST TECHNIQUE: Multiplanar, multiecho pulse sequences of the brain and surrounding structures were obtained without intravenous contrast. COMPARISON:  Head CT 01/05/2014 FINDINGS: Brain: There is cortical restricted diffusion posteriorly in the right frontal lobe consistent with acute infarct. Additional smaller regions of diffusion abnormality are present in the posterior left frontal lobe, anterior left frontal lobe, and right greater than left medial parietal lobes as well as right cerebellum with normal to slightly reduced ADC compatible with acute to early subacute infarcts. There is mild associated T2 hyperintensity/edema associated with the infarcts. A chronic microhemorrhage is noted in the region of the right external capsule. No mass, midline shift, or extra-axial fluid collection is identified. The ventricles and sulci are within normal limits for age. The pituitary gland is borderline prominent in  height without a discrete sellar mass identified on this nondedicated study. Vascular: Major intracranial vascular flow voids are preserved. Skull and upper cervical spine: Unremarkable bone marrow signal. Sinuses/Orbits: Unremarkable orbits. Large bilateral mastoid effusions. Mild left frontal sinus mucosal thickening. Other: None. IMPRESSION: 1. Small acute to early subacute infarcts involving bilateral cerebral cortex and right cerebellum. 2. Large bilateral mastoid effusions. Electronically Signed   By: Logan Bores M.D.   On: 08/26/2019 19:33   US Renal  Result Date: 08/07/2019 CLINICAL DATA:  Acute renal failure. EXAM: RENAL / URINARY TRACT ULTRASOUND COMPLETE COMPARISON:  None. FINDINGS: Right Kidney: Renal measurements: 10.0 x 5.9 x 4.8 cm = volume: 146 mL . Echogenicity within normal limits. No mass or hydronephrosis visualized. Left Kidney: Renal measurements: 11.2 x 6.8 x 5.3 cm = volume: 215 mL. Echogenicity within normal limits. No mass or hydronephrosis visualized. Bladder: Appears normal for degree of bladder distention. Other: None. IMPRESSION: Normal renal ultrasound. Electronically Signed   By: Marijo Conception M.D.   On: 08/07/2019 09:47   Dg Chest Port 1 View  Result Date: 09/01/2019 CLINICAL DATA:  68 year old male with chest tube placement. COVID-19 positive. EXAM: PORTABLE CHEST 1 VIEW COMPARISON:  Chest radiograph dated 08/31/2019 and chest CT dated 08/13/2019 FINDINGS: Support apparatus in stable position. No significant interval change in the appearance of the lungs and bilateral confluent densities predominantly involving the peripheral/subpleural regions as well as bibasilar lungs since the prior radiograph. Right lung base cystic or bolus area appears similar to prior radiograph. No large or detectable pneumothorax. Faint linear density over the right apex appears similar to prior radiograph, likely artifactual and less likely represents pleural reflection. Stable cardiac  silhouette. No acute osseous pathology. IMPRESSION: 1. No significant interval change in the appearance of the lungs compared to the prior radiograph. 2. Support apparatus in stable position. Electronically Signed   By: Anner Crete M.D.   On: 09/01/2019 09:55   Dg Chest Port 1 View  Result Date: 08/31/2019 CLINICAL DATA:  COVID-19 positive.  Tracheostomy tube. EXAM: PORTABLE CHEST 1 VIEW COMPARISON:  08/30/2019 FINDINGS: Tracheostomy tube in adequate position. Enteric tube courses into the stomach and off the film as tip is not visualized. Right medial basilar chest tube unchanged. Left IJ central venous catheter horizontally oriented over the SVC unchanged. Lungs are hypoinflated with persistent tiny right-sided pneumothorax. Stable opacification over the right midlung and right base. Stable hazy opacification over the left base/retrocardiac region. Remainder of the exam is unchanged. IMPRESSION: 1. Stable opacification over the right midlung and bibasilar regions. 2.  Stable tiny right-sided pneumothorax. 3.  Tubes and lines as described. Electronically Signed   By: Marin Olp M.D.   On: 08/31/2019 07:15   Dg Chest Port 1 View  Result Date: 08/30/2019 CLINICAL DATA:  Pneumothorax on right EXAM: PORTABLE CHEST 1 VIEW COMPARISON:  Chest radiograph 08/26/2019, 08/23/2019 FINDINGS: Stable cardiomediastinal contours. Unchanged for apparatus. Persistent small right basilar pneumothorax with chest tube in place. Persistent infiltrates/atelectasis in the right mid upper lung and left base. No large pleural effusion. No acute finding in the visualized skeleton. IMPRESSION: Unchanged chest radiograph with persistent small right basilar pneumothorax and atelectasis/infiltrates in the right mid to upper lung as well as left base. Electronically Signed   By: Audie Pinto M.D.   On: 08/30/2019 09:42   Dg Chest Port 1 View  Result Date: 08/26/2019 CLINICAL DATA:  Pneumothorax. EXAM: PORTABLE CHEST 1 VIEW  COMPARISON:  08/23/2019. FINDINGS: Tracheostomy tube, left IJ line, feeding tube, right chest tube in stable position. Stable right base pneumothorax. Stable atelectatic changes/infiltrate right upper lung and left base. Heart size stable. IMPRESSION: 1. Lines and tubes including right chest tube in stable position. Stable right base pneumothorax. 2. Stable right upper lung and left lower atelectatic changes/infiltrates. Chest is unchanged from prior exam. Electronically Signed   By: Marcello Moores  Register   On: 08/26/2019 10:36   Dg Chest Port 1 View  Result Date: 08/23/2019 CLINICAL DATA:  COVID-19 positive, endotracheal tube in place. EXAM: PORTABLE CHEST 1 VIEW COMPARISON:  Chest x-rays dated 08/22/2019. FINDINGS: Tubes and lines are stable in position, including a RIGHT-sided chest tube. Prominent lucency again noted at the RIGHT lung base, stable in the short-term interval, compatible with the air that was associated with a complicated pleural effusion as demonstrated on chest CT of 08/13/2019, at that time suspected to represent sequela of manipulation of a bronchial occlusion tube at bronchoscopy. Patchy airspace opacities are again seen bilaterally, stable, presumed pneumonia. No new lung findings. IMPRESSION: 1. Stable chest x-ray. Tubes and lines are stable in position, including the RIGHT-sided chest tube with tip positioned at the medial aspects of the RIGHT lung base 2. Patchy airspace opacities bilaterally, stable, presumed bilateral pneumonia. 3. Stable appearance of the collection of air at the RIGHT lung base, as described above. 4. No new lung findings. Electronically Signed   By: Franki Cabot M.D.   On: 08/23/2019 09:04   Dg Chest Port 1 View  Result Date: 08/22/2019 CLINICAL DATA:  Status post tracheostomy. COVID-19 viral pneumonia. EXAM: PORTABLE CHEST 1 VIEW COMPARISON:  08/22/2019 FINDINGS: A new tracheostomy tube is seen in appropriate position. Feeding tube and bilateral central venous  catheters remain in stable position. Tip of the right subclavian central venous catheter seen overlying the inferior right atrium. Heart size is within normal limits allowing for low lung volumes. Right chest tube remains in place. No pneumothorax visualized. Bullous disease again seen in right lung base. Increased atelectasis or airspace disease is seen in the retrocardiac left lung base. Mild bilateral peripheral pulmonary airspace disease shows no significant change. IMPRESSION: New tracheostomy tube in appropriate position. Right subclavian [place] venous catheter tip overlies the lower right atrium. Increased atelectasis or airspace disease in retrocardiac left lung base. Electronically Signed   By: Marlaine Hind M.D.   On: 08/22/2019 16:49   Dg Chest Port 1 View  Result Date: 08/22/2019 CLINICAL DATA:  Intubated. EXAM: PORTABLE CHEST 1 VIEW COMPARISON:  08/21/2019 FINDINGS: Endotracheal tube in satisfactory position. Feeding tube extending into the  distal stomach with its tip not included. Right subclavian catheter tip in the region of the superior cavoatrial junction or upper right atrium. No significant change in bilateral interstitial and patchy opacities and right basilar bullous changes. No definite pneumothorax. Minimal right pleural fluid. Normal sized heart. Thoracic spine degenerative changes. IMPRESSION: 1. No significant change in bilateral pneumonia and interstitial disease and right basilar bullous changes. 2. Minimal right pleural fluid. Electronically Signed   By: Claudie Revering M.D.   On: 08/22/2019 07:12   Dg Chest Port 1 View  Result Date: 08/21/2019 CLINICAL DATA:  COVID-19, intubation EXAM: PORTABLE CHEST 1 VIEW COMPARISON:  Portable exam 0535 hours compared to 08/20/2019 FINDINGS: Tip of endotracheal tube projects 3.4 cm above carina. Feeding tube extends into stomach. LEFT jugular dual-lumen central venous catheter tip projects over confluence of the SVC and LEFT brachiocephalic  vein. Tip of RIGHT jugular line projects over RIGHT atrium; recommend withdrawal 4 cm to place tip at cavoatrial junction. RIGHT thoracostomy tube unchanged. Stable heart size mediastinal contours. Patchy BILATERAL airspace infiltrates consistent with pneumonia. No pleural effusion. Tiny loculated pneumothorax at lateral RIGHT base. IMPRESSION: Tiny residual RIGHT basilar atelectasis despite thoracostomy tube. Persistent BILATERAL pulmonary infiltrates consistent with pneumonia. Electronically Signed   By: Lavonia Dana M.D.   On: 08/21/2019 08:44   Dg Chest Port 1 View  Result Date: 08/20/2019 CLINICAL DATA:  68 year old male with a history of intubation EXAM: PORTABLE CHEST 1 VIEW COMPARISON:  08/19/2019, 08/18/2019, 08/17/2019 FINDINGS: Cardiomediastinal silhouette unchanged in size and contour. Low lung volumes persist. Unchanged endotracheal tube which terminates 2.4 cm above the carina. Unchanged gastric tube terminating in the stomach. Unchanged left IJ temporary hemodialysis catheter which terminates at the confluence of the left brachiocephalic vein and SVC. Unchanged right subclavian central venous catheter with the tip appearing to terminate superior right atrium. Patchy opacities of the bilateral lungs, unchanged from the prior. No pneumothorax. Blunting of left costophrenic angle with opacity in the retrocardiac region. IMPRESSION: Similar appearance of the lungs with low lung volumes and bilateral patchy opacities, likely combination of atelectasis/consolidation, developing chronic changes, and possible small left pleural effusion. Unchanged endotracheal tube, left IJ temporary hemodialysis catheter, right subclavian central venous catheter, and gastric tube. Electronically Signed   By: Corrie Mckusick D.O.   On: 08/20/2019 07:50   Dg Chest Port 1 View  Result Date: 08/19/2019 CLINICAL DATA:  Pneumonia and COVID-19. EXAM: PORTABLE CHEST 1 VIEW COMPARISON:  August 18, 2019. FINDINGS: Stable  cardiomediastinal silhouette. Endotracheal and nasogastric tubes are unchanged in position. Left internal jugular and right subclavian catheters are unchanged in position. Stable right-sided chest tube is noted with probable stable loculated right basilar hydropneumothorax. Stable opacities are noted throughout both lungs concerning for pneumonia. Bony thorax is unremarkable. IMPRESSION: Stable support apparatus. Stable right-sided chest tube with probable right basilar hydropneumothorax. Stable bilateral lung opacities. Electronically Signed   By: Marijo Conception M.D.   On: 08/19/2019 07:44   Dg Chest Port 1 View  Result Date: 08/18/2019 CLINICAL DATA:  Follow-up endotracheal tube EXAM: PORTABLE CHEST 1 VIEW COMPARISON:  08/17/2019 FINDINGS: Cardiac shadow is stable. Right subclavian central line is noted in satisfactory position. Endotracheal tube and gastric catheter are again seen and stable. Left jugular dialysis catheter is noted. Patchy opacities are again seen in both lungs. Right chest tube is again noted with loculated hydropneumothorax stable from the prior exam. IMPRESSION: Overall stable appearance of the chest. Electronically Signed   By: Linus Mako.D.  On: 08/18/2019 03:09   Dg Chest Port 1 View  Result Date: 08/17/2019 CLINICAL DATA:  Right-sided pneumothorax EXAM: PORTABLE CHEST 1 VIEW COMPARISON:  08/16/2019 FINDINGS: No significant interval change in [facility] portable examination with right-sided chest tube in position and a small, persistent, loculated appearing hydropneumothorax about the right lung base. There is redemonstrated, extensive bilateral dense heterogeneous and interstitial airspace opacity, worse on the right. Support apparatus including endotracheal tubes, esophagogastric tube, left neck and right subclavian vascular catheters are unchanged. Cardiomegaly. IMPRESSION: 1. No significant interval change in [facility] portable examination with right-sided chest tube in position and a  small, persistent, loculated appearing hydropneumothorax about the right lung base. 2. There is redemonstrated, extensive bilateral dense heterogeneous and interstitial airspace opacity, worse on the right, consistent with multifocal infection. 3.  Unchanged support apparatus. Electronically Signed   By: Eddie Candle M.D.   On: 08/17/2019 15:54   Dg Chest Port 1 View  Result Date: 08/16/2019 CLINICAL DATA:  68 year old male with positive COVID-19. EXAM: PORTABLE CHEST 1 VIEW COMPARISON:  Earlier radiograph dated 08/16/2019 FINDINGS: Support lines and tubes as seen previously. Diffuse bilateral airspace opacities similar to prior radiograph. Apparent area of lucency at the right costophrenic angle may be artifactual and related to skin fold or represent a small residual right pneumothorax. Stable cardiac silhouette. No acute osseous pathology. IMPRESSION: 1. Artifact versus small residual right pneumothorax at the right costophrenic angle. 2. Diffuse bilateral airspace opacities similar to prior radiograph. 3. Support lines and tubes as seen previously. Electronically Signed   By: Anner Crete M.D.   On: 08/16/2019 21:01   Dg Chest Port 1 View  Result Date: 08/16/2019 CLINICAL DATA:  Hypoxia EXAM: PORTABLE CHEST 1 VIEW COMPARISON:  August 15, 2019 FINDINGS: Endotracheal tube tip is 4.2 cm above the carina. Nasogastric tube tip and side port are below the diaphragm. Right subclavian catheter tip is in the right atrium. Chest tube noted on the right inferiorly, unchanged in position. Left internal jugular catheter tip is at the junction of the left innominate vein and superior vena cava. There is a catheter with its tip overlying the right lower lobe bronchus medially, stable. No pneumothorax is evident currently. There is persistent airspace consolidation in the right mid and lower lung zones. There is patchy opacity in the left mid and lower lung zones, stable. No new opacity evident. Heart is upper  normal in size with pulmonary vascular normal. No adenopathy. No bone lesions. IMPRESSION: Stable tube and catheter positions. No pneumothorax evident currently. Airspace opacity bilaterally with areas of consolidation on the right, stable. No new opacity evident. Stable cardiac silhouette. Electronically Signed   By: Lowella Grip III M.D.   On: 08/16/2019 09:06   Dg Chest Port 1 View  Result Date: 08/15/2019 CLINICAL DATA:  Orogastric tube placement. EXAM: PORTABLE CHEST 1 VIEW COMPARISON:  Same day. FINDINGS: Stable cardiomediastinal silhouette. Distal tip of enteric tube is seen in proximal stomach. Endotracheal tube is unchanged. Left internal jugular catheter is unchanged. Right subclavian catheter is unchanged. Stable position of right-sided basilar chest tube is noted without pneumothorax. Stable bilateral lung opacities are noted. Stable catheter seen entering right mainstem bronchus compared to prior exam. Bony thorax is unremarkable. IMPRESSION: Feeding tube has been removed. There has been interval placement of what appears to be nasogastric tube with tip in expected position of proximal stomach. Stable bilateral lung opacities and other support apparatus are noted. Electronically Signed   By: Bobbe Medico.D.  On: 08/15/2019 14:55   Dg Chest Port 1 View  Result Date: 08/15/2019 CLINICAL DATA:  Chest tube, shortness of breath EXAM: PORTABLE CHEST 1 VIEW COMPARISON:  08/14/2019 FINDINGS: Support devices including right basilar chest tube remain in place, unchanged. No definite pneumothorax currently. Patchy bilateral airspace opacities are again noted, unchanged. Heart is borderline in size. IMPRESSION: Patchy bilateral airspace disease again noted, unchanged. No definite visible pneumothorax. Electronically Signed   By: Rolm Baptise M.D.   On: 08/15/2019 07:58   Dg Chest Port 1 View  Result Date: 08/14/2019 CLINICAL DATA:  Pneumothorax, chest tube EXAM: PORTABLE CHEST 1 VIEW  COMPARISON:  08/14/2019 FINDINGS: Right chest tube and remainder support devices are stable. No pneumothorax. Patchy bilateral airspace opacities, right greater than left again noted, stable. Mild cardiomegaly. No visible effusions. IMPRESSION: No visible pneumothorax.  No change since prior study Electronically Signed   By: Rolm Baptise M.D.   On: 08/14/2019 11:46   Dg Chest Port 1 View  Result Date: 08/14/2019 CLINICAL DATA:  Acute respiratory failure with hypoxemia. EXAM: PORTABLE CHEST 1 VIEW COMPARISON:  August 13, 2019. FINDINGS: Stable cardiomediastinal silhouette. Endotracheal and feeding tubes are unchanged in position. Stable left internal jugular and right subclavian catheters are noted. Stable catheter is seen extending into right mainstem bronchus. Stable bilateral lung opacities are noted. Stable right-sided chest tube without pneumothorax. Small bilateral pleural effusions may be present. Bony thorax is unremarkable. IMPRESSION: Grossly stable support apparatus. Stable bilateral lung opacities as described above. Electronically Signed   By: Marijo Conception M.D.   On: 08/14/2019 07:43   Dg Chest Port 1 View  Result Date: 08/13/2019 CLINICAL DATA:  Endotracheal to EXAM: PORTABLE CHEST 1 VIEW COMPARISON:  [id number] hours FINDINGS: Endotracheal tube and 2 central venous line unchanged. NG tube exchanged for feeding tube. A catheter extends through the lumen of the endotracheal tube into the RIGHT mainstem bronchus. There is hydropneumothorax at the RIGHT lung base again noted. There is diffuse patchy airspace disease which is slightly worse on the RIGHT and similar on the LEFT. IMPRESSION: 1. Stable support apparatus. 2. No change in subpulmonic RIGHT hydropneumothorax. 3. No change in patchy airspace disease in the RIGHT upper lobe. 4. Slight worsening airspace disease in the LEFT lung. Electronically Signed   By: Suzy Bouchard M.D.   On: 08/13/2019 17:39   Dg Chest Port 1 View  Result  Date: 08/13/2019 CLINICAL DATA:  Right pneumothorax EXAM: PORTABLE CHEST 1 VIEW COMPARISON:  08/13/2019 FINDINGS: Support devices including right chest tube remain in place, unchanged. Lucency again noted over the right lung base which could reflect loculated right pneumothorax. Airspace disease throughout the lungs bilaterally, right greater than left with improvement in aeration in the left lung since prior study. Heart is normal size. IMPRESSION: Continued lucency over the right lung base could reflect loculated right pneumothorax. Bilateral airspace disease, right greater than left. Improved airspace disease on the left since prior study. Electronically Signed   By: Rolm Baptise M.D.   On: 08/13/2019 11:34   Dg Chest Port 1 View  Result Date: 08/13/2019 CLINICAL DATA:  Respiratory failure EXAM: PORTABLE CHEST 1 VIEW COMPARISON:  Yesterday FINDINGS: Endotracheal tube tip is at the clavicular heads. Right subclavian central line with tip at the upper cavoatrial junction. Endobronchial blocker on the right in stable position over the right hilum. The orogastric tube reaches the stomach. Right base chest tube which crosses the midline, presumably anterior and accentuated by leftward rotation. Basal pneumothorax  on the right is unchanged. Unchanged bilateral airspace disease. IMPRESSION: 1. Unchanged hardware positioning including endobronchial blocker tip overlapping the right hilum. 2. Unchanged bilateral airspace disease with right base lucency more likely loculated pneumothorax rather than cavitary pneumonia Electronically Signed   By: Monte Fantasia M.D.   On: 08/13/2019 09:04   Dg Chest Port 1 View  Result Date: 08/12/2019 CLINICAL DATA:  Respiratory failure. EXAM: PORTABLE CHEST 1 VIEW COMPARISON:  Chest x-ray 08/10/2019. FINDINGS: Endotracheal tube, dual-lumen left IJ line, right PICC line in stable position. Right lower lobe bronchus balloon occlusion catheter stable position. Right chest tube in  stable position. Small right base pneumothorax cannot be excluded on today's exam. Bibasilar atelectasis/infiltrates again noted. No interim change. Tiny bilateral pleural effusions. IMPRESSION: 1. Endotracheal tube, dual-lumen left IJ line, right PICC line stable position. Right lower lobe bronchus balloon occlusion catheter in stable position. Right chest tube in stable position. 2. Small right base pneumothorax cannot be excluded on today's exam. 3. Bibasilar atelectasis/infiltrates again noted without interim change. Critical Value/emergent results were called by telephone at the time of interpretation on 08/12/2019 at 7:31 am to the patient's nurse, who verbally acknowledged these results. Electronically Signed   By: Marcello Moores  Register   On: 08/12/2019 07:33   Dg Chest Port 1 View  Result Date: 08/10/2019 CLINICAL DATA:  COVID positive EXAM: PORTABLE CHEST 1 VIEW COMPARISON:  08/09/2019 FINDINGS: Left lung apex and lateral costophrenic angle are partially visualized. Endotracheal tube terminates 5 cm above the carina. Right chest tube.  No pneumothorax is seen. Left IJ dual lumen catheter terminates in the mid SVC. Right subclavian venous catheter terminates at the cavoatrial junction. Enteric tube terminates in the proximal stomach. Stable selective balloon occlusion catheter in the right lower lobe bronchus. Multifocal patchy opacities, lower lung predominant, grossly unchanged. No definite pleural effusions. The heart is normal in size. IMPRESSION: Multifocal patchy opacities, grossly unchanged, compatible with pneumonia in this patient with known COVID. Stable right chest tube.  No pneumothorax is seen. Endotracheal tube terminates 5 cm above the carina. Additional stable support apparatus as above. Electronically Signed   By: Julian Hy M.D.   On: 08/10/2019 16:59   Dg Chest Port 1 View  Result Date: 08/09/2019 CLINICAL DATA:  Right-sided pneumothorax. Acute respiratory failure. COVID-19.  EXAM: PORTABLE CHEST 1 VIEW COMPARISON:  08/08/2019; 08/07/2019; chest CT-07/25/2019 FINDINGS: Grossly unchanged cardiac silhouette and mediastinal contours. Stable positioning of support apparatus with endotracheal tube overlying tracheal air column with tip superior to the carina, left jugular approach central venous catheter tips projected over the superior SVC, right subclavian approach central venous catheter tip projects over the superior caval junction, enteric tube tip and side port projected over the gastric antrum and right-sided chest tube and selective balloon occlusion catheter involving the right mainstem bronchus. No pneumothorax. Redemonstrated extensive bilateral slightly nodular airspace opacities. Left basilar/retrocardiac consolidative opacities are unchanged. No new focal airspace opacities. No definite pleural effusion. No acute osseous abnormalities. IMPRESSION: 1.  Stable positioning of support apparatus.  No pneumothorax. 2. No significant change in extensive bilateral airspace opacities compatible with provided history of COVID-19 infection. Electronically Signed   By: Sandi Mariscal M.D.   On: 08/09/2019 05:18   Dg Chest Port 1 View  Result Date: 08/08/2019 CLINICAL DATA:  Pulmonary infiltrates. Acute respiratory failure with hypoxemia. COVID-19. EXAM: PORTABLE CHEST 1 VIEW COMPARISON:  08/07/2019 and 08/06/2019 FINDINGS: Endotracheal tube in good position 4.7 cm above the carina. Uniblocker tube tip is in the  right lower lobe. Left jugular vein catheter tip is in the SVC at the level of the carina. Right subclavian catheter tip is in the right atrium. Right-sided chest tube at the base, unchanged. NG tube tip below the diaphragm. There is improved aeration at the right lung base and right midzone. No change in the infiltrates at the left base and left midzone. Heart size and vascularity are normal. IMPRESSION: 1. Improved aeration at the right lung base. No change in the infiltrates at  the left base and left midzone. No residual pneumothorax. 2. Tubes and lines appear essentially unchanged. Electronically Signed   By: Lorriane Shire M.D.   On: 08/08/2019 16:27   Dg Chest Port 1 View  Result Date: 08/07/2019 CLINICAL DATA:  Evaluate endotracheal tube position. COVID-19 positive. EXAM: PORTABLE CHEST 1 VIEW COMPARISON:  08/06/2019 FINDINGS: Endotracheal tube terminates 4.9 cm above carina. Nasogastric tube extends beyond the inferior aspect of the film. A right-sided chest tube remains in place. The right-sided pleural drain has been removed. Left internal jugular line tip at mid SVC. Right subclavian line tip at high right atrium, similar. Mild cardiomegaly. Increased fluid within the right minor fissure. The previously described right apical pneumothorax is either improved or resolved. Possible trace pleural air remaining. Increased right base airspace disease. Relatively similar patchy right upper and left mid to lower lung pulmonary opacities. IMPRESSION: Appropriate position of endotracheal tube. Single right chest tube remaining in place, with improved to resolved right apical pneumothorax. Increased right pleural fluid with worsened right base airspace disease. Otherwise similar multifocal pneumonia. Electronically Signed   By: Abigail Miyamoto M.D.   On: 08/07/2019 20:18   Dg Chest Port 1 View  Result Date: 08/06/2019 CLINICAL DATA:  Chest tube, follow-up pneumothorax EXAM: PORTABLE CHEST 1 VIEW COMPARISON:  Chest radiograph from earlier today. FINDINGS: Endotracheal tube tip is 4.2 cm above the carina. Enteric tube enters stomach with the tip not seen on this image. Left internal jugular central venous catheter terminates in the middle third of the SVC. Right subclavian central venous catheter terminates over the right atrium. Stable right pigtail chest tube with tip in the peripheral lower right pleural space. Separate right basilar chest tube. Stable cardiomediastinal silhouette  with normal heart size. Small residual right apical pneumothorax, significantly decreased. No left pneumothorax. Midline mediastinum. No pleural effusion. Extensive patchy opacities throughout both lungs appear unchanged. IMPRESSION: 1. Small residual right apical pneumothorax, significantly decreased. 2. Stable extensive patchy opacities throughout both lungs. 3. Well-positioned support structures as described. Electronically Signed   By: Ilona Sorrel M.D.   On: 08/06/2019 15:25   Dg Chest Port 1 View  Addendum Date: 08/06/2019   ADDENDUM REPORT: 08/06/2019 13:46 ADDENDUM: These results were called by telephone at the time of interpretation on 08/06/2019 at 1:43 pm to provider Dr. Roselie Awkward, who verbally acknowledged these results. Electronically Signed   By: Aletta Edouard M.D.   On: 08/06/2019 13:46   Result Date: 08/06/2019 CLINICAL DATA:  Replacement of non tunneled dialysis catheter. Respiratory failure secondary to COVID-19 pneumonia. EXAM: PORTABLE CHEST 1 VIEW COMPARISON:  Film earlier today at 1119 hours FINDINGS: Replaced left jugular non tunneled dialysis catheter now projects inferiorly with the tip in the SVC. Other support apparatus stable with the endotracheal tube tip approximately 5 cm above the carina. Right lateral pneumothorax is slightly larger with stable positioning of a lateral pigtail thoracostomy tube. Bilateral severe pneumonia appears stable since the prior chest x-ray. Lucency abutting the right hemidiaphragm  may relate to a component subpulmonic air. However, free intraperitoneal air under the right hemidiaphragm is not entirely excluded by semi-erect chest x-ray. IMPRESSION: 1. Replaced left jugular non tunneled dialysis catheter tip is in the SVC. 2. Slightly larger right lateral pneumothorax. 3. Cannot exclude free air under the right hemidiaphragm. However, the appearance may relate to relative lucency of the lung at the right lung base and component of basilar  pneumothorax as well. Electronically Signed: By: Aletta Edouard M.D. On: 08/06/2019 13:41   Dg Chest Port 1 View  Result Date: 08/06/2019 CLINICAL DATA:  COVID.  Insertion of dialysis catheter EXAM: PORTABLE CHEST 1 VIEW COMPARISON:  None. FINDINGS: Left internal jugular dialysis catheter courses across the midline and likely passes into the right innominate vein. Right central line tip is at the cavoatrial junction. Endotracheal tube and NG tube are unchanged. Cardiomegaly. Patchy bilateral airspace opacities again noted, unchanged. Right chest tube remains in place with right basilar pneumothorax, stable. IMPRESSION: Interval placement of left internal jugular Vas-Cath. The tip likely passes into the right innominate vein. No left pneumothorax. Right chest tube remains in place with small right basilar pneumothorax, stable. Stable patchy bilateral airspace opacities. Electronically Signed   By: Rolm Baptise M.D.   On: 08/06/2019 11:56   Dg Chest Port 1 View  Result Date: 08/06/2019 CLINICAL DATA:  68 year old male central line placement. COVID-19. EXAM: PORTABLE CHEST 1 VIEW COMPARISON:  0007 hours today. FINDINGS: Portable AP semi upright view at 0236 hours. The right IJ central line is no longer identified. A right subclavian approach central line now is in place. The tip projects at the level of the right atrium about 2.5 centimeters below the expected level of the cavoatrial junction. ETT tip is at the level the clavicles. Stable visible enteric tube. Stable right chest tube. Small volume right pneumothorax appears stable along with patchy right mid and lower lung opacity and confluent left lung base opacity. The left upper lobe remains clear. Stable cardiac size and mediastinal contours. IMPRESSION: 1. Right subclavian approach central line placed, tip at the level of the right atrium about 2.5 cm below the cavoatrial junction. 2.  Otherwise stable lines and tubes. 3. Stable small volume right  pneumothorax, bilateral pneumonia. Electronically Signed   By: Genevie Ann M.D.   On: 08/06/2019 03:49   Dg Chest Port 1 View  Result Date: 08/06/2019 CLINICAL DATA:  PICC line EXAM: PORTABLE CHEST 1 VIEW COMPARISON:  08/05/2019, 08/03/2019, 08/02/2019 FINDINGS: Endotracheal tube tip is about 3.3 cm superior to the carina. Esophageal tube tip below the diaphragm but non included. Right lower chest tube remains in place. Residual small pneumothorax at the right lower lateral chest, CP angle and lung base. No upper extremity venous catheters are seen. There is catheter tubing coiled over the right neck/thoracic inlet. Multifocal bilateral consolidations are grossly unchanged, worst in the left base. Stable cardiomediastinal silhouette. IMPRESSION: 1. No upper extremity venous catheter is seen. Possible catheter tubing looped over the right neck 2. Small right lateral and basilar pneumothorax slightly more apparent compared to most recent prior 3. Stable multifocal airspace consolidations. Electronically Signed   By: Donavan Foil M.D.   On: 08/06/2019 00:38   Dg Chest Port 1 View  Result Date: 08/05/2019 CLINICAL DATA:  Acute respiratory failure EXAM: PORTABLE CHEST 1 VIEW COMPARISON:  08/05/2019 FINDINGS: Endotracheal tube appropriately positioned in the mid trachea approximately 4 cm from the carina. Transesophageal tube tip and side port distal to the GE junction,  terminating near the gastric antrum. Pigtail right pleural catheter is in stable position. Additional support devices and monitoring leads overlie the chest. There is a small residual right apical pneumothorax (see annotated image). There is increasing dense bilateral airspace opacity towards the bases which could reflect a combination of atelectasis and residual consolidated lung. No acute osseous or soft tissue abnormality. IMPRESSION: 1. Stable satisfactory positioning of lines and tubes. 2. Small residual right apical pneumothorax. 3. Increasing  dense bilateral airspace disease towards the bases, which could reflect a combination of worsening atelectasis and residual consolidated lung. Electronically Signed   By: Lovena Le M.D.   On: 08/05/2019 23:09   Dg Chest Port 1 View  Result Date: 08/05/2019 CLINICAL DATA:  Chest tube. EXAM: PORTABLE CHEST 1 VIEW COMPARISON:  08/05/2019. FINDINGS: Endotracheal tube and NG tube stable position. Right chest tube in stable position. Right pneumothorax is improved from prior exam. Mild residual pneumothorax noted. Bilateral atelectatic changes again noted. No pleural effusion. Thoracic spine scoliosis and degenerative change IMPRESSION: 1.  Lines and tubes including right chest tube in stable position. 2.  Interim partial resolution of right-sided pneumothorax. Electronically Signed   By: Marcello Moores  Register   On: 08/05/2019 09:42   Dg Chest Port 1 View  Result Date: 08/05/2019 CLINICAL DATA:  Mechanically assisted ventilation. EXAM: PORTABLE CHEST 1 VIEW COMPARISON:  August 03, 2019. FINDINGS: Endotracheal and nasogastric tubes are unchanged in position. Stable cardiomediastinal silhouette. There is now interval development of large right pneumothorax resulting in some mediastinal shift to the left suggesting tension pneumothorax. There appears to be complete atelectasis of the right lung. Stable multifocal opacities are noted in the left lung concerning for pneumonia. Bony thorax is unremarkable. IMPRESSION: Interval development of large right pneumothorax with mediastinal shift to the left suggesting tension pneumothorax. Complete atelectasis of the right lung is noted. Critical Value/emergent results were called by telephone at the time of interpretation on 08/05/2019 at 8:25 am to Jal , who verbally acknowledged these results and stated that a chest tube has already been placed. Electronically Signed   By: Marijo Conception M.D.   On: 08/05/2019 08:25   Dg Chest Port 1 View  Result  Date: 08/05/2019 CLINICAL DATA:  Chest tube placement EXAM: PORTABLE CHEST 1 VIEW COMPARISON:  Earlier today FINDINGS: New right-sided chest tube with decreased but still sizable right pneumothorax. I spoke with ICU nurse and the tube is functional and is currently on suction. There is still leftward displacement of the mediastinum, although the right diaphragm is now elevated rather than depressed. Normal heart size. Normal positioning of endotracheal orogastric tubes. Bilateral airspace disease in the setting of known pneumonia. There is improving right lung aeration. IMPRESSION: New right chest tube with decreased but still large pneumothorax. There is still leftward displacement the mediastinum but right diaphragm elevation has resolved. Electronically Signed   By: Monte Fantasia M.D.   On: 08/05/2019 07:46   Dg Chest Port 1 View  Result Date: 08/03/2019 CLINICAL DATA:  Encounter for hypoxia EXAM: PORTABLE CHEST 1 VIEW COMPARISON:  Chest radiograph 08/02/2019 FINDINGS: Stable cardiomediastinal contours. Interval removal of a right central venous catheter, otherwise stable support apparatus. There is mildly improved aeration of the left lung. Persistent consolidative airspace opacities in the right lower lobe. No pneumothorax or large pleural effusion. No acute finding in the visualized skeleton. IMPRESSION: Mildly improved aeration of the left lung. Persistent consolidative opacities at the right lung base concerning for ongoing infection. Electronically Signed  By: Audie Pinto M.D.   On: 08/03/2019 17:04   Dg Abd Decub  Result Date: 08/06/2019 CLINICAL DATA:  Rule out pneumoperitoneum EXAM: ABDOMEN - 1 VIEW DECUBITUS COMPARISON:  08/02/2019 abdominal radiographs FINDINGS: No evidence of pneumatosis or pneumoperitoneum. No disproportionately dilated small bowel loops. Moderate gas in the nondependent colon. Enteric tube tip is seen in the proximal stomach. IMPRESSION: 1. No evidence of  pneumoperitoneum. 2. Moderate gas in the nondependent colon. Nonobstructive bowel gas pattern. 3. Enteric tube tip in the proximal stomach. Electronically Signed   By: Ilona Sorrel M.D.   On: 08/06/2019 17:16   Dg Abd Portable 1v  Result Date: 08/30/2019 CLINICAL DATA:  NG tube placement EXAM: PORTABLE ABDOMEN - 1 VIEW COMPARISON:  08/16/2019 FINDINGS: There is a nasogastric tube with the tip projecting over the stomach. There is no bowel dilatation to suggest obstruction. There is no evidence of pneumoperitoneum, portal venous gas or pneumatosis. There are no pathologic calcifications along the expected course of the ureters. The osseous structures are unremarkable. IMPRESSION: Nasogastric tube with the tip projecting over the stomach. Electronically Signed   By: Kathreen Devoid   On: 08/30/2019 15:56   Dg Abd Portable 1v  Result Date: 08/16/2019 CLINICAL DATA:  OG tube placement EXAM: PORTABLE ABDOMEN - 1 VIEW COMPARISON:  08/13/2019 FINDINGS: Nasogastric tube with the tip projecting over the stomach. There is no bowel dilatation to suggest obstruction. There is no evidence of pneumoperitoneum, portal venous gas or pneumatosis. There are no pathologic calcifications along the expected course of the ureters. The osseous structures are unremarkable. IMPRESSION: Nasogastric tube with the tip projecting over the stomach. Electronically Signed   By: Kathreen Devoid   On: 08/16/2019 12:09   Dg Abd Portable 1v  Result Date: 08/13/2019 CLINICAL DATA:  ETT advancement, Feeding tube placement EXAM: PORTABLE ABDOMEN - 1 VIEW COMPARISON:  Radiograph 08/10/2019 FINDINGS: NG tube has been replaced with a feeding tube. The tip of the feeding tube is within the second portion the duodenum. Gas in the stomach. No dilated loops of large or small bowel. Bibasilar effusions and atelectasis. IMPRESSION: 1. Feeding tube with tip in the second portion the duodenum. 2. No evidence of bowel obstruction. 3. Bibasilar effusions and  atelectasis. Electronically Signed   By: Suzy Bouchard M.D.   On: 08/13/2019 17:30    Labs:  CBC: Recent Labs    08/30/19 0553 08/31/19 0800 08/31/19 1315 08/31/19 2056 09/01/19 0356 09/01/19 0842  WBC 9.0 8.8  --   --  8.8 8.1  HGB 7.2* 6.2* 6.1* 7.7* 6.8* 7.6*  HCT 23.3* 20.1* 19.3* 24.4* 21.8* 24.3*  PLT 361 328  --   --  357 326    COAGS: Recent Labs    08/22/19 0415 08/22/19 1121 08/23/19 0415 08/24/19 0515  INR  --  1.0  --   --   APTT 63* 52* 59* 55*    BMP: Recent Labs    08/29/19 0528 08/30/19 0553 08/31/19 0800 09/01/19 0356  NA 136 136 135 134*  K 5.3* 5.7* 5.3* 5.0  CL 99 98 99 98  CO2 23 21* 20* 20*  GLUCOSE 115* 121* 125* 118*  BUN 83* 136* 118* 145*  CALCIUM 8.5* 8.8* 8.6* 8.6*  CREATININE 4.21* 6.09* 5.24* 6.49*  GFRNONAA 14* 9* 11* 8*  GFRAA 16* 10* 12* 9*    LIVER FUNCTION TESTS: Recent Labs    08/13/19 0500  08/14/19 0530  08/15/19 0500  08/16/19 0430  08/23/19 0415  08/23/19 1550 08/24/19 0515 08/26/19 0724  BILITOT 0.5  --  0.8  --  0.3  --  0.6  --   --   --   --   --   AST 24  --  25  --  29  --  38  --   --   --   --   --   ALT 9  --  8  --  6  --  8  --   --   --   --   --   ALKPHOS 52  --  61  --  59  --  71  --   --   --   --   --   PROT 6.2*  --  6.2*  --  6.0*  --  6.2*  --   --   --   --   --   ALBUMIN 2.8*   2.8*   < > 2.4*   2.4*   < > 2.2*   2.2*   < > 2.1*   2.2*   < > 2.3* 2.4* 2.5* 2.1*   < > = values in this interval not displayed.    TUMOR MARKERS: No results for input(s): AFPTM, CEA, CA199, CHROMGRNA in the last 8760 hours.  Assessment and Plan:  +Covid PNA 07/22/19 Resolved Mechanical ventilation- prolonged AKI- no renal recovery Temp placed CCM 08/06/19 Need now for tunneled dialysis catheter placement Scheduled for IR tomorrow Risks and benefits discussed with the patient's wife at bedside including, but not limited to bleeding, infection, vascular injury, pneumothorax which may require chest  tube placement, air embolism or even death  All of her questions were answered, she is agreeable to proceed. Consent signed and in chart.   Thank you for this interesting consult.  I greatly enjoyed meeting Johathon Overturf and look forward to participating in their care.  A copy of this report was sent to the requesting provider on this date.  Electronically Signed: Lavonia Drafts, PA-C 09/01/2019, 2:11 PM   I spent a total of 20 Minutes    in face to face in clinical consultation, greater than 50% of which was counseling/coordinating care for tunneled HD catheter

## 2019-09-01 NOTE — Progress Notes (Addendum)
Patient ID: [name], male   DOB: 02-20-1951, 68 y.o.   MRN: [id number] S: remains on pressors. 1L off on HD yesterday. 28- 35% FiO2 w/ trach collar.  O:BP 127/66   Pulse 99   Temp 99.1 F (37.3 C) (Axillary)   Resp (!) 24   Ht 5\' 10"  (1.778 m)   Wt 78.9 kg   SpO2 94%   BMI 24.96 kg/m   Intake/Output Summary (Last 24 hours) at 09/01/2019 1305 Last data filed at 09/01/2019 1000 Gross per 24 hour  Intake 3169.32 ml  Output 150 ml  Net 3019.32 ml   Intake/Output: I/O last 3 completed shifts: In: 5199.3 [I.V.:2451.4; Blood:315; Other:60; NG/GT:2272.8; IV Piggyback:100] Out: 300 [Stool:200; Chest Tube:100]  Intake/Output this shift:  Total I/O In: 375.5 [I.V.:50.5; Blood:325] Out: -  Weight change:    Exam:   Trach collar, L hemiparesis, nods head to answer questions   L IJ temp cath   No jvd   Chest clear ant / lat, R chest tube in place    Cor reg no RG     Abd soft ntnd no ascites     Ext 2+ bilat UE edema, no LE edema    HD here - 3.5 - 4h , ~ 1L goals  Assessment/Plan:   AKI/CKD stage 3- in setting of covid PNA and has remained oliguric/anuric. Started on CVVHD 08/07/19-08/24/19. Came off pressors and transferred to [facility] for iHD. Back on pressors though at [facility] because of stroke issues   Getting iHD, HD yest  Still has temp cath, will consult IR for [facility] transition  Sig elevated BUN likely due to hypercatabolic state and R lung necrosis  Continue HD 3x/ week TTS for now. HD tomorrow. Will not attempt large UF since we don't have much leeway w/ regards to BP's  Off pressors today  Per neuro, he needs to keep his SBP >120 after for optimal perfusion  COVID PNA- treated with remdesivir, dexamethasone, and convalescent plasma. Most recent covid test negative x 2 on 08/24/19 and 08/25/19  VDRF - off vent, on trach collar per PCCM  Sepsis/shock- better, on midodrine 10 tid, pressors off today  Right pneumothorax/emphyema/necrotic RLL- s/p chest tube with air  leak. Per CT Surgery he is not a candidate for VATS, however concern that necrotic RLL is source of hypercatabolic state. `  New cerebral and cerebellar strokes- per primary svc  A fib/RVR- per PCCM  GIB- acute s/p EGD which showed gastric ulcer with stigmata of recent bleeding s/p injection and clipping. Able to tolerate low dose heparin with CVVHD before stopping it  MSSA bacteremia- treated  Acinetobacter and pseudomonal pna- sp abx course  Rob Jonnie Finner, MD 08/30/2019, 4:45 PM   Recent Labs  Lab 08/26/19 0724 08/27/19 0336 08/28/19 0409 08/29/19 0528 08/30/19 0553 08/31/19 0800 09/01/19 0356  NA 140 139 139 136 136 135 134*  K 6.2* 4.8 6.2* 5.3* 5.7* 5.3* 5.0  CL 105 101 101 99 98 99 98  CO2 19* 21* 19* 23 21* 20* 20*  GLUCOSE 139* 117* 124* 115* 121* 125* 118*  BUN 145* 91* 146* 83* 136* 118* 145*  CREATININE 6.43* 4.44* 6.61* 4.21* 6.09* 5.24* 6.49*  ALBUMIN 2.1*  --   --   --   --   --   --   CALCIUM 9.6 9.3 9.4 8.5* 8.8* 8.6* 8.6*  PHOS 9.5*  --   --   --  9.7*  --   --    Liver  Function Tests: Recent Labs  Lab 08/26/19 0724  ALBUMIN 2.1*   No results for input(s): LIPASE, AMYLASE in the last 168 hours. No results for input(s): AMMONIA in the last 168 hours. CBC: Recent Labs  Lab 08/28/19 0409 08/29/19 0528 08/30/19 0553 08/31/19 0800  08/31/19 2056 09/01/19 0356 09/01/19 0842  WBC 7.7 8.7 9.0 8.8  --   --  8.8 8.1  NEUTROABS 5.7 6.6 6.9  --   --   --   --   --   HGB 7.9* 7.4* 7.2* 6.2*   < > 7.7* 6.8* 7.6*  HCT 25.4* 24.2* 23.3* 20.1*   < > 24.4* 21.8* 24.3*  MCV 93.4 96.0 95.5 95.7  --   --  95.2 92.7  PLT 310 312 361 328  --   --  357 326   < > = values in this interval not displayed.   Cardiac Enzymes: No results for input(s): CKTOTAL, CKMB, CKMBINDEX, TROPONINI in the last 168 hours. CBG: Recent Labs  Lab 08/31/19 1613 08/31/19 1935 09/01/19 0004 09/01/19 0350 09/01/19 0757  GLUCAP 129* 114* 120* 109* 120*    Iron Studies: No  results for input(s): IRON, TIBC, TRANSFERRIN, FERRITIN in the last 72 hours. Studies/Results: Dg Chest Port 1 View  Result Date: 09/01/2019 CLINICAL DATA:  68 year old male with chest tube placement. COVID-19 positive. EXAM: PORTABLE CHEST 1 VIEW COMPARISON:  Chest radiograph dated 08/31/2019 and chest CT dated 08/13/2019 FINDINGS: Support apparatus in stable position. No significant interval change in the appearance of the lungs and bilateral confluent densities predominantly involving the peripheral/subpleural regions as well as bibasilar lungs since the prior radiograph. Right lung base cystic or bolus area appears similar to prior radiograph. No large or detectable pneumothorax. Faint linear density over the right apex appears similar to prior radiograph, likely artifactual and less likely represents pleural reflection. Stable cardiac silhouette. No acute osseous pathology. IMPRESSION: 1. No significant interval change in the appearance of the lungs compared to the prior radiograph. 2. Support apparatus in stable position. Electronically Signed   By: Anner Crete M.D.   On: 09/01/2019 09:55   Dg Chest Port 1 View  Result Date: 08/31/2019 CLINICAL DATA:  COVID-19 positive.  Tracheostomy tube. EXAM: PORTABLE CHEST 1 VIEW COMPARISON:  08/30/2019 FINDINGS: Tracheostomy tube in adequate position. Enteric tube courses into the stomach and off the film as tip is not visualized. Right medial basilar chest tube unchanged. Left IJ central venous catheter horizontally oriented over the SVC unchanged. Lungs are hypoinflated with persistent tiny right-sided pneumothorax. Stable opacification over the right midlung and right base. Stable hazy opacification over the left base/retrocardiac region. Remainder of the exam is unchanged. IMPRESSION: 1. Stable opacification over the right midlung and bibasilar regions. 2.  Stable tiny right-sided pneumothorax. 3.  Tubes and lines as described. Electronically Signed   By:  Marin Olp M.D.   On: 08/31/2019 07:15   Dg Abd Portable 1v  Result Date: 08/30/2019 CLINICAL DATA:  NG tube placement EXAM: PORTABLE ABDOMEN - 1 VIEW COMPARISON:  08/16/2019 FINDINGS: There is a nasogastric tube with the tip projecting over the stomach. There is no bowel dilatation to suggest obstruction. There is no evidence of pneumoperitoneum, portal venous gas or pneumatosis. There are no pathologic calcifications along the expected course of the ureters. The osseous structures are unremarkable. IMPRESSION: Nasogastric tube with the tip projecting over the stomach. Electronically Signed   By: Kathreen Devoid   On: 08/30/2019 15:56   . aspirin  325 mg Per Tube Daily  . atorvastatin  40 mg Oral q1800  . chlorhexidine  15 mL Mouth/Throat BID  . Chlorhexidine Gluconate Cloth  6 each Topical Daily  . darbepoetin (ARANESP) injection - NON-DIALYSIS  100 mcg Subcutaneous Q Wed-1800  . feeding supplement (PRO-STAT SUGAR FREE 64)  60 mL Per Tube BID  . Gerhardt's butt cream   Topical TID  . mouth rinse  15 mL Mouth Rinse 10 times per day  . midodrine  10 mg Per Tube TID WC  . pantoprazole sodium  40 mg Per Tube BID  . sodium zirconium cyclosilicate  10 g Oral Daily  . sucralfate  1 g Oral Q6H    BMET    Component Value Date/Time   NA 134 (L) 09/01/2019 0356   NA 143 11/08/2017 1030   K 5.0 09/01/2019 0356   CL 98 09/01/2019 0356   CO2 20 (L) 09/01/2019 0356   GLUCOSE 118 (H) 09/01/2019 0356   BUN 145 (H) 09/01/2019 0356   BUN 8 11/08/2017 1030   CREATININE 6.49 (H) 09/01/2019 0356   CREATININE 1.15 11/20/2014 1224   CALCIUM 8.6 (L) 09/01/2019 0356   GFRNONAA 8 (L) 09/01/2019 0356   GFRAA 9 (L) 09/01/2019 0356   CBC    Component Value Date/Time   WBC 8.1 09/01/2019 0842   RBC 2.62 (L) 09/01/2019 0842   HGB 7.6 (L) 09/01/2019 0842   HGB 15.8 11/08/2017 1030   HCT 24.3 (L) 09/01/2019 0842   HCT 46.6 11/08/2017 1030   PLT 326 09/01/2019 0842   PLT 202 11/08/2017 1030   MCV 92.7  09/01/2019 0842   MCV 93 11/08/2017 1030   MCH 29.0 09/01/2019 0842   MCHC 31.3 09/01/2019 0842   RDW 17.1 (H) 09/01/2019 0842   RDW 14.2 11/08/2017 1030   LYMPHSABS 0.8 08/30/2019 0553   LYMPHSABS 1.1 11/08/2017 1030   MONOABS 0.6 08/30/2019 0553   EOSABS 0.5 08/30/2019 0553   EOSABS 0.1 11/08/2017 1030   BASOSABS 0.0 08/30/2019 0553   BASOSABS 0.0 11/08/2017 1030

## 2019-09-02 ENCOUNTER — Encounter (HOSPITAL_COMMUNITY): Payer: Self-pay | Admitting: Interventional Radiology

## 2019-09-02 ENCOUNTER — Inpatient Hospital Stay (HOSPITAL_COMMUNITY): Payer: Medicare HMO

## 2019-09-02 DIAGNOSIS — N179 Acute kidney failure, unspecified: Secondary | ICD-10-CM | POA: Diagnosis not present

## 2019-09-02 DIAGNOSIS — E8770 Fluid overload, unspecified: Secondary | ICD-10-CM | POA: Diagnosis not present

## 2019-09-02 DIAGNOSIS — J9600 Acute respiratory failure, unspecified whether with hypoxia or hypercapnia: Secondary | ICD-10-CM | POA: Diagnosis not present

## 2019-09-02 DIAGNOSIS — U071 COVID-19: Secondary | ICD-10-CM | POA: Diagnosis not present

## 2019-09-02 DIAGNOSIS — Z930 Tracheostomy status: Secondary | ICD-10-CM | POA: Diagnosis not present

## 2019-09-02 DIAGNOSIS — J1289 Other viral pneumonia: Secondary | ICD-10-CM | POA: Diagnosis not present

## 2019-09-02 DIAGNOSIS — J939 Pneumothorax, unspecified: Secondary | ICD-10-CM | POA: Diagnosis not present

## 2019-09-02 DIAGNOSIS — J9311 Primary spontaneous pneumothorax: Secondary | ICD-10-CM | POA: Diagnosis not present

## 2019-09-02 DIAGNOSIS — K254 Chronic or unspecified gastric ulcer with hemorrhage: Secondary | ICD-10-CM | POA: Diagnosis not present

## 2019-09-02 DIAGNOSIS — J80 Acute respiratory distress syndrome: Secondary | ICD-10-CM | POA: Diagnosis not present

## 2019-09-02 DIAGNOSIS — Z9689 Presence of other specified functional implants: Secondary | ICD-10-CM | POA: Diagnosis not present

## 2019-09-02 HISTORY — PX: IR US GUIDE VASC ACCESS LEFT: IMG2389

## 2019-09-02 HISTORY — PX: IR FLUORO GUIDE CV LINE LEFT: IMG2282

## 2019-09-02 LAB — BPAM RBC
Blood Product Expiration Date: 202012082359
Blood Product Expiration Date: 202012112359
ISSUE DATE / TIME: 202011081604
ISSUE DATE / TIME: 202011090540
Unit Type and Rh: 5100
Unit Type and Rh: 5100

## 2019-09-02 LAB — TYPE AND SCREEN
ABO/RH(D): O POS
Antibody Screen: NEGATIVE
Unit division: 0
Unit division: 0

## 2019-09-02 LAB — GLUCOSE, CAPILLARY
Glucose-Capillary: 115 mg/dL — ABNORMAL HIGH (ref 70–99)
Glucose-Capillary: 122 mg/dL — ABNORMAL HIGH (ref 70–99)
Glucose-Capillary: 110 mg/dL — ABNORMAL HIGH (ref 70–99)
Glucose-Capillary: 91 mg/dL (ref 70–99)
Glucose-Capillary: 108 mg/dL — ABNORMAL HIGH (ref 70–99)
Glucose-Capillary: 133 mg/dL — ABNORMAL HIGH (ref 70–99)
Glucose-Capillary: 115 mg/dL — ABNORMAL HIGH (ref 70–99)

## 2019-09-02 LAB — CBC
HCT: 23.4 % — ABNORMAL LOW (ref 39.0–52.0)
HCT: 24.3 % — ABNORMAL LOW (ref 39.0–52.0)
Hemoglobin: 7.4 g/dL — ABNORMAL LOW (ref 13.0–17.0)
Hemoglobin: 7.7 g/dL — ABNORMAL LOW (ref 13.0–17.0)
MCH: 29.1 pg (ref 26.0–34.0)
MCH: 29.1 pg (ref 26.0–34.0)
MCHC: 31.6 g/dL (ref 30.0–36.0)
MCHC: 31.7 g/dL (ref 30.0–36.0)
MCV: 92.1 fL (ref 80.0–100.0)
MCV: 91.7 fL (ref 80.0–100.0)
Platelets: 367 10*3/uL (ref 150–400)
Platelets: 365 10*3/uL (ref 150–400)
RBC: 2.54 MIL/uL — ABNORMAL LOW (ref 4.22–5.81)
RBC: 2.65 MIL/uL — ABNORMAL LOW (ref 4.22–5.81)
RDW: 17.2 % — ABNORMAL HIGH (ref 11.5–15.5)
RDW: 17.1 % — ABNORMAL HIGH (ref 11.5–15.5)
WBC: 8.8 10*3/uL (ref 4.0–10.5)
WBC: 9.4 10*3/uL (ref 4.0–10.5)
nRBC: 0.6 % — ABNORMAL HIGH (ref 0.0–0.2)
nRBC: 0.5 % — ABNORMAL HIGH (ref 0.0–0.2)

## 2019-09-02 LAB — BASIC METABOLIC PANEL
Anion gap: 17 — ABNORMAL HIGH (ref 5–15)
BUN: 183 mg/dL — ABNORMAL HIGH (ref 8–23)
CO2: 19 mmol/L — ABNORMAL LOW (ref 22–32)
Calcium: 8.9 mg/dL (ref 8.9–10.3)
Chloride: 100 mmol/L (ref 98–111)
Creatinine, Ser: 7.44 mg/dL — ABNORMAL HIGH (ref 0.61–1.24)
GFR calc Af Amer: 8 mL/min — ABNORMAL LOW (ref >60)
GFR calc non Af Amer: 7 mL/min — ABNORMAL LOW (ref >60)
Glucose, Bld: 117 mg/dL — ABNORMAL HIGH (ref 70–99)
Potassium: 5.9 mmol/L — ABNORMAL HIGH (ref 3.5–5.1)
Sodium: 136 mmol/L (ref 135–145)

## 2019-09-02 LAB — PROTIME-INR
INR: 1.2 (ref 0.8–1.2)
Prothrombin Time: 14.9 seconds (ref 11.4–15.2)

## 2019-09-02 MED ORDER — PANTOPRAZOLE SODIUM 40 MG PO PACK
40.0000 mg | PACK | Freq: Every day | ORAL | Status: DC
  Administered 2019-09-04 – 2019-09-27 (×24): 40 mg
  Filled 2019-09-02 (×26): qty 20

## 2019-09-02 MED ORDER — LIDOCAINE HCL 1 % IJ SOLN
INTRAMUSCULAR | Status: AC
  Filled 2019-09-02: qty 20

## 2019-09-02 MED ORDER — CHLORHEXIDINE GLUCONATE 4 % EX LIQD
CUTANEOUS | Status: AC
  Filled 2019-09-02: qty 15

## 2019-09-02 MED ORDER — FENTANYL CITRATE (PF) 100 MCG/2ML IJ SOLN
INTRAMUSCULAR | Status: AC
  Filled 2019-09-02: qty 2

## 2019-09-02 MED ORDER — HEPARIN SODIUM (PORCINE) 1000 UNIT/ML DIALYSIS: 20.0000 [IU]/kg | INTRAMUSCULAR | Status: DC | PRN

## 2019-09-02 MED ORDER — LIDOCAINE HCL 1 % IJ SOLN
INTRAMUSCULAR | Status: AC | PRN
  Administered 2019-09-02: 10 mL

## 2019-09-02 MED ORDER — PHENYLEPHRINE HCL-NACL 10-0.9 MG/250ML-% IV SOLN
0.0000 ug/min | INTRAVENOUS | Status: DC
  Administered 2019-09-02: 25 ug/min via INTRAVENOUS

## 2019-09-02 MED ORDER — HEPARIN SODIUM (PORCINE) 1000 UNIT/ML DIALYSIS
3000.0000 [IU] | INTRAMUSCULAR | Status: DC | PRN
  Filled 2019-09-02: qty 3

## 2019-09-02 MED ORDER — HEPARIN SODIUM (PORCINE) 1000 UNIT/ML IJ SOLN
INTRAMUSCULAR | Status: AC
  Filled 2019-09-02: qty 1

## 2019-09-02 MED ORDER — FENTANYL CITRATE (PF) 100 MCG/2ML IJ SOLN
INTRAMUSCULAR | Status: AC | PRN
  Administered 2019-09-02: 25 ug via INTRAVENOUS
  Administered 2019-09-02: 50 ug via INTRAVENOUS

## 2019-09-02 MED ORDER — MIDAZOLAM HCL 2 MG/2ML IJ SOLN
INTRAMUSCULAR | Status: AC | PRN
  Administered 2019-09-02: 1 mg via INTRAVENOUS
  Administered 2019-09-02: 0.5 mg via INTRAVENOUS

## 2019-09-02 MED ORDER — MIDAZOLAM HCL 2 MG/2ML IJ SOLN
INTRAMUSCULAR | Status: AC
  Filled 2019-09-02: qty 2

## 2019-09-02 NOTE — Sedation Documentation (Signed)
ETCO2 unable to be obtained. Patient on trach collar.

## 2019-09-02 NOTE — Procedures (Signed)
  Procedure: L IJ tunneled HD cath placement Palindrome 23 EBL:   minimal Complications:  none immediate  See full dictation in [facility].  Dillard Cannon MD Main # [phone] Pager  [phone]

## 2019-09-02 NOTE — Progress Notes (Signed)
PROGRESS NOTE    [name]  DDU:[id number] DOB: 1950-11-07 DOA: 07/25/2019 PCP: Kathyrn Drown, MD    Brief Narrative:  68 year old with a history of GERD and BPH who presented to [facility], ED with S OB and was found to be hypoxic. He was initially diagnosed with COVID-19 September 20. 1 to 2 days prior to his presentation he developed worsening shortness of breath with anorexia. His wife's physician during a teleconference call noted that the patient himself is very tachypneic and ultimately EMS was sent to the house. EMS found the patient to have saturations in the 40s. In the ED on nonrebreather the patient sats only improved to the 80s. The patient was severely confused and required emergent intubation. Chest x-ray confirmed multifocal infiltrates. CT chest was negative for PE  Assessment & Plan:   Principal Problem:   Pneumonia due to COVID-19 virus Active Problems:   BPH (benign prostatic hyperplasia)   Acute respiratory failure due to COVID-19 (HCC)   AKI (acute kidney injury) (HCC)   Fluid overload   Pneumothorax on right   Acute renal failure (ARF) (HCC)   GI bleed   Atrial fibrillation with RVR (HCC)   Constipation   Gastric ulcer with hemorrhage   Acute respiratory failure (HCC)   Chest tube in place   Primary spontaneous pneumothorax   Acute respiratory distress syndrome (ARDS) due to COVID-19 virus ([place])   Tracheostomy status (HCC)   Cerebral thrombosis with cerebral infarction   Acute Hypoxic Resp. Failure/Pneumonia due to COVID-19 -2 negative COVID-19 test, last on 08/25/2019 -Patient underwent tracheostomy on 10/30, somewhat still vent dependent Intermittent trach collar -Completed course of remdesivir and steroids -Patient also received Actemra and convalescent plasma -Continue pulmonary toilet/trach -PCCM continues to follow for. Consideration now for possible endobronchial valve placement if fails chest tube management   Recent Labs        Lab Results  Component Value Date   SARSCOV2NAA NEGATIVE 08/25/2019   [place] NEGATIVE 08/24/2019   [place] Detected (A) 07/22/2019     COVID-19 specific Treatment: Decadron 10/2 > 10/12 Remdesivir 10/2 > 10/6 Actemra 10/2 Convalescent plasma 10/3  Encephalopathy/generalized weakness/deconditioned, acute CVA -Recent MRI performed and reviewed, findings of bilateral infarcts -Appreciate input by Stroke team. Recommendation for ASA, EEG, permissive HTN. Also to start statin when able to -Earlier had discussed with Dr. Erlinda Hong today. Recommendation to avoid hypotension, especially during HD -Continue PT/OT as tolerated -Neurology not recommending full anticoagulation given hx GI bleeding -Patient remains on pressor support to maintain appropriate BP, per below -Overall poor prognosis . Appreciate input by Palliative Care. 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 Air leak improved -CTS recommendation to continue with chest tube with suction, possibility for endobronchial valve placment per CCM  MSSA bacteremia/septic shock -Currently afebrile, with no leukocytosis -2 out of 2 blood cultures positive on 10/9, repeat BC have been negative, last on 08/20/19 NGTD -Echo done on 08/11/2019 was negative -Concern remains for recurrent bacteremia and possible endocarditis, ID recommended Cefazolin to be continued for 24 days from 10/31 -presently remains stable  Pseudomonas and Acinetobacter in  BAL specimen -Patient was started on meropenem  on 10/23 for 10-day treatment -Completed course of abx on 08/25/19  AKI on CKD stage 3 -Remains anuric, HD dependent -Status post CRRT, last done on 11/1 -Initiated HD on 08/26/2019 -Nephrology continues to follow for HD. Had been on pressors to maintain adequate perfusion, now off pressors -Pt underwent L IJ tunneled HD cath placement 11/10 by IR  Acute GI bleed secondary to gastric ulcer -S/p EGD with clipping of vessel and large ulcer -H. pylori antibody was noted to be elevated -[facility] had been following -Continue PPI -Patient will need 2 weeks treatment for H. pylori when he is ready for discharge -Anticipate f/u with [facility] gastroenterology when discharged ultimately  Acute blood loss anemia -Likely secondary to above -Pt has received 7 units of PRBC's thus far this admit -Continue to follow CBC trends -Continuing to avoid anticoagulation per above  Elevated D-dimer -No evidence for DVT on Dopplers on 10/8 -Thought to be related to suspected empyema in the right lung -Remains stable at this time  New onset paroxysmal atrial fibrillation with RVR -Heart rate controlled, currently sinus rhythm -Atrial fibrillation likely occurred in the setting of acute illness -Unable to fully anticoagulate due to GI bleeding -See above, acute stroke identified. Have consulted Neurology. Appreciate input. Recommendation to continue ASA. Unable to tolerate anticoagulation secondary to hx of transfusion dependent GI bleeding  Pressure injuries Pressure Injury 08/16/19 Lip Medial;Lower Stage I -  Intact skin with non-blanchable redness of a localized area usually over a bony prominence. healing  (Active)  08/16/19 2000  Location: Lip  Location Orientation: Medial;Lower  Staging: Stage I -  Intact skin with non-blanchable redness of a localized area usually over a bony prominence.  Wound Description (Comments): healing   Present on Admission: No     Pressure Injury 08/16/19 Lip  Lower;Mid Stage I -  Intact skin with non-blanchable redness of a localized area usually over a bony prominence. healing  (Active)  08/16/19 2342  Location: Lip  Location Orientation: Lower;Mid  Staging: Stage I -  Intact skin with non-blanchable redness of a localized area usually over a bony prominence.  Wound Description (Comments): healing   Present on Admission:      Pressure Injury 08/24/19 Buttocks Right;Left;Mid Deep Tissue Injury - Purple or maroon localized area of discolored intact skin or blood-filled blister due to damage of underlying soft tissue from pressure and/or shear. (Active)  08/24/19 0800  Location: Buttocks  Location Orientation: Right;Left;Mid  Staging: Deep Tissue Injury - Purple or maroon localized area of discolored intact skin or blood-filled blister due to damage of underlying soft tissue from pressure and/or shear.  Wound Description (Comments):   Present on Admission: No    DVT prophylaxis: SCD's Code Status: DNR Family Communication: Pt in room, wife at bedside today Disposition Plan: Uncertain at this time  Consultants:  Pulmonology.  Nephrology.  Gastroenterology., Neurology, Palliative Care  Procedures:     Antimicrobials: Anti-infectives (From admission, onward)   Start     Dose/Rate Route Frequency Ordered Stop   08/26/19 2200  ceFAZolin (ANCEF) IVPB 1 g/50 mL premix     1 g 100 mL/hr over 30 Minutes Intravenous Every 24 hours 08/24/19 1021 09/15/19 2359   08/24/19 1100  meropenem (MERREM) 1 g in sodium chloride 0.9 % 100 mL IVPB     1 g 200 mL/hr over 30 Minutes Intravenous Every 24 hours 08/24/19 1021 08/25/19 1156   08/23/19 1100  ceFAZolin (ANCEF) IVPB  2g/100 mL premix  Status:  Discontinued     2 g 200 mL/hr over 30 Minutes Intravenous Every 12 hours 08/23/19 1048 08/24/19 1021   08/21/19 1700  vancomycin (VANCOCIN) IVPB 750 mg/150 ml premix  Status:  Discontinued     750 mg 150 mL/hr over 60 Minutes Intravenous Every 24 hours  08/21/19 0843 08/23/19 1048   08/21/19 1400  vancomycin (VANCOCIN) IVPB 750 mg/150 ml premix  Status:  Discontinued     750 mg 150 mL/hr over 60 Minutes Intravenous Every 24 hours 08/20/19 1146 08/20/19 1218   08/21/19 1000  meropenem (MERREM) 1 g in sodium chloride 0.9 % 100 mL IVPB  Status:  Discontinued     1 g 200 mL/hr over 30 Minutes Intravenous Every 12 hours 08/20/19 1535 08/23/19 1048   08/21/19 0413  meropenem (MERREM) 500 mg in sodium chloride 0.9 % 100 mL IVPB  Status:  Discontinued     500 mg 200 mL/hr over 30 Minutes Intravenous Every 24 hours 08/20/19 1245 08/20/19 1535   08/20/19 1330  vancomycin (VANCOCIN) 1,750 mg in sodium chloride 0.9 % 500 mL IVPB     1,750 mg 250 mL/hr over 120 Minutes Intravenous  Once 08/20/19 1222 08/20/19 1619   08/20/19 1300  vancomycin (VANCOCIN) 1,250 mg in sodium chloride 0.9 % 250 mL IVPB  Status:  Discontinued     1,250 mg 166.7 mL/hr over 90 Minutes Intravenous  Once 08/20/19 1146 08/20/19 1218   08/20/19 1224  vancomycin variable dose per unstable renal function (pharmacist dosing)  Status:  Discontinued      Does not apply See admin instructions 08/20/19 1225 08/21/19 1152   08/15/19 1600  meropenem (MERREM) 1 g in sodium chloride 0.9 % 100 mL IVPB  Status:  Discontinued     1 g 200 mL/hr over 30 Minutes Intravenous Every 12 hours 08/15/19 1543 08/20/19 1245   08/15/19 1000  voriconazole (VFEND) tablet 200 mg  Status:  Discontinued     200 mg Per Tube Every 12 hours 08/14/19 1148 08/15/19 1134   08/14/19 1600  anidulafungin (ERAXIS) 100 mg in sodium chloride 0.9 % 100 mL IVPB  Status:  Discontinued     100 mg 78 mL/hr over 100 Minutes Intravenous Every 24 hours 08/13/19 1645 08/14/19 1148   08/14/19 1300  voriconazole (VFEND) tablet 400 mg     400 mg Per Tube Every 12 hours 08/14/19 1148 08/14/19 2217   08/13/19 1700  anidulafungin (ERAXIS) 200 mg in sodium chloride 0.9 % 200 mL IVPB     200 mg 78 mL/hr over 200 Minutes Intravenous   Once 08/13/19 1645 08/13/19 2030   08/13/19 0200  ceFAZolin (ANCEF) IVPB 2g/100 mL premix  Status:  Discontinued     2 g 200 mL/hr over 30 Minutes Intravenous Every 12 hours 08/11/19 1512 08/15/19 1543   08/12/19 0200  ceFEPIme (MAXIPIME) 2 g in sodium chloride 0.9 % 100 mL IVPB     2 g 200 mL/hr over 30 Minutes Intravenous Every 12 hours 08/11/19 1509 08/12/19 1341   08/07/19 2200  ceFEPIme (MAXIPIME) 2 g in sodium chloride 0.9 % 100 mL IVPB  Status:  Discontinued     2 g 200 mL/hr over 30 Minutes Intravenous Every 12 hours 08/07/19 1540 08/11/19 1509   08/07/19 1000  anidulafungin (ERAXIS) 100 mg in sodium chloride 0.9 % 100 mL IVPB  Status:  Discontinued     100 mg 78 mL/hr over 100 Minutes Intravenous Every 24 hours  08/06/19 0934 08/08/19 1203   08/07/19 0800  ceFEPIme (MAXIPIME) 2 g in sodium chloride 0.9 % 100 mL IVPB  Status:  Discontinued     2 g 200 mL/hr over 30 Minutes Intravenous Every 24 hours 08/06/19 1036 08/07/19 1540   08/06/19 1000  anidulafungin (ERAXIS) 200 mg in sodium chloride 0.9 % 200 mL IVPB    Note to Pharmacy: please   200 mg 78 mL/hr over 200 Minutes Intravenous Every 24 hours 08/06/19 0925 08/06/19 1600   08/06/19 0800  ceFEPIme (MAXIPIME) 2 g in sodium chloride 0.9 % 100 mL IVPB  Status:  Discontinued     2 g 200 mL/hr over 30 Minutes Intravenous Every 12 hours 08/06/19 0733 08/06/19 1036   08/02/19 2200  ceFAZolin (ANCEF) IVPB 2g/100 mL premix  Status:  Discontinued     2 g 200 mL/hr over 30 Minutes Intravenous Every 8 hours 08/02/19 1338 08/06/19 0733   08/02/19 1400  vancomycin (VANCOCIN) 1,250 mg in sodium chloride 0.9 % 250 mL IVPB  Status:  Discontinued     1,250 mg 166.7 mL/hr over 90 Minutes Intravenous Every 24 hours 08/01/19 1320 08/02/19 1332   08/01/19 1330  vancomycin (VANCOCIN) 2,000 mg in sodium chloride 0.9 % 500 mL IVPB     2,000 mg 250 mL/hr over 120 Minutes Intravenous  Once 08/01/19 1158 08/01/19 1636   08/01/19 1300  ceFEPIme  (MAXIPIME) 2 g in sodium chloride 0.9 % 100 mL IVPB  Status:  Discontinued     2 g 200 mL/hr over 30 Minutes Intravenous Every 12 hours 08/01/19 1158 08/02/19 1332   07/26/19 1600  remdesivir 100 mg in sodium chloride 0.9 % 250 mL IVPB     100 mg 500 mL/hr over 30 Minutes Intravenous Every 24 hours 07/26/19 0132 07/29/19 1823   07/26/19 0215  remdesivir 200 mg in sodium chloride 0.9 % 250 mL IVPB     200 mg 500 mL/hr over 30 Minutes Intravenous Once 07/26/19 0132 07/26/19 0520      Subjective: Cannot assess given current mentation  Objective: Vitals:   09/02/19 1556 09/02/19 1600 09/02/19 1635 09/02/19 1700  BP:  127/71 (!) 157/82 (!) 129/110  Pulse:  99  94  Resp:  20    Temp: 97.8 F (36.6 C)  97.6 F (36.4 C)   TempSrc: Oral  Oral   SpO2:  99%  97%  Weight:   84 kg   Height:        Intake/Output Summary (Last 24 hours) at 09/02/2019 1748 Last data filed at 09/02/2019 1700 Gross per 24 hour  Intake 926.44 ml  Output -329 ml  Net 1255.44 ml   Filed Weights   09/02/19 1230 09/02/19 1240 09/02/19 1635  Weight: 83 kg 83 kg 84 kg    Examination: General exam: Conversant, in no acute distress Respiratory system: normal chest rise, clear, no audible wheezing Cardiovascular system: regular rhythm, s1-s2 Gastrointestinal system: Nondistended, nontender, pos BS Central nervous system: No seizures, no tremors Extremities: No cyanosis, no joint deformities Skin: No rashes, no pallor Psychiatry: Affect normal // no auditory hallucinations    Data Reviewed: I have personally reviewed following labs and imaging studies  CBC: Recent Labs  Lab 08/27/19 0336 08/28/19 0409 08/29/19 0528 08/30/19 0553  09/01/19 0842 09/01/19 1600 09/01/19 2227 09/02/19 0307 09/02/19 0420  WBC 8.3 7.7 8.7 9.0   < > 8.1 8.4 8.6 8.8 9.4  NEUTROABS 6.1 5.7 6.6 6.9  --   --   --   --   --   --  HGB 7.8* 7.9* 7.4* 7.2*   < > 7.6* 7.4* 7.6* 7.4* 7.7*  HCT 25.0* 25.4* 24.2* 23.3*   < >  24.3* 23.3* 23.5* 23.4* 24.3*  MCV 94.3 93.4 96.0 95.5   < > 92.7 92.5 90.7 92.1 91.7  PLT 272 310 312 361   < > 326 341 347 367 365   < > = values in this interval not displayed.   Basic Metabolic Panel: Recent Labs  Lab 08/29/19 0528 08/30/19 0553 08/31/19 0800 09/01/19 0356 09/02/19 0420  NA 136 136 135 134* 136  K 5.3* 5.7* 5.3* 5.0 5.9*  CL 99 98 99 98 100  CO2 23 21* 20* 20* 19*  GLUCOSE 115* 121* 125* 118* 117*  BUN 83* 136* 118* 145* 183*  CREATININE 4.21* 6.09* 5.24* 6.49* 7.44*  CALCIUM 8.5* 8.8* 8.6* 8.6* 8.9  MG  --  2.9*  --   --   --   PHOS  --  9.7*  --   --   --    GFR: Estimated Creatinine Clearance: 10.1 mL/min (A) (by C-G formula based on SCr of 7.44 mg/dL (H)). Liver Function Tests: No results for input(s): AST, ALT, ALKPHOS, BILITOT, PROT, ALBUMIN in the last 168 hours. No results for input(s): LIPASE, AMYLASE in the last 168 hours. No results for input(s): AMMONIA in the last 168 hours. Coagulation Profile: Recent Labs  Lab 09/02/19 0649  INR 1.2   Cardiac Enzymes: No results for input(s): CKTOTAL, CKMB, CKMBINDEX, TROPONINI in the last 168 hours. BNP (last 3 results) No results for input(s): PROBNP in the last 8760 hours. HbA1C: No results for input(s): HGBA1C in the last 72 hours. CBG: Recent Labs  Lab 09/02/19 0035 09/02/19 0409 09/02/19 0741 09/02/19 1135 09/02/19 1512  GLUCAP 115* 122* 110* 91 108*   Lipid Profile: No results for input(s): CHOL, HDL, LDLCALC, TRIG, CHOLHDL, LDLDIRECT in the last 72 hours. Thyroid Function Tests: No results for input(s): TSH, T4TOTAL, FREET4, T3FREE, THYROIDAB in the last 72 hours. Anemia Panel: No results for input(s): VITAMINB12, FOLATE, FERRITIN, TIBC, IRON, RETICCTPCT in the last 72 hours. Sepsis Labs: No results for input(s): PROCALCITON, LATICACIDVEN in the last 168 hours.  Recent Results (from the past 240 hour(s))  SARS CORONAVIRUS 2 (TAT 6-24 HRS) Nasopharyngeal Nasopharyngeal Swab      Status: None   Collection Time: 08/24/19  1:55 PM   Specimen: Nasopharyngeal Swab  Result Value Ref Range Status   SARS Coronavirus 2 NEGATIVE NEGATIVE Final    Comment: (NOTE) SARS-CoV-2 target nucleic acids are NOT DETECTED. The SARS-CoV-2 RNA is generally detectable in upper and lower respiratory specimens during the acute phase of infection. Negative results do not preclude SARS-CoV-2 infection, do not rule out co-infections with other pathogens, and should not be used as the sole basis for treatment or other patient management decisions. Negative results must be combined with clinical observations, patient history, and epidemiological information. The expected result is Negative. Fact Sheet for Patients: SugarRoll.be Fact Sheet for Healthcare Providers: https://www.woods-mathews.com/ This test is not yet approved or cleared by the [place] FDA and  has been authorized for detection and/or diagnosis of SARS-CoV-2 by FDA under an Emergency Use Authorization (EUA). This EUA will remain  in effect (meaning this test can be used) for the duration of the COVID-19 declaration under Section 56 4(b)(1) of the Act, 21 U.S.C. section 360bbb-3(b)(1), unless the authorization is terminated or revoked sooner. Performed at [facility], [place] [address], [place], [place]  [address]   SARS CORONAVIRUS 2 (TAT 6-24 HRS) Nasopharyngeal Nasopharyngeal Swab     Status: None   Collection Time: 08/25/19 11:37 AM   Specimen: Nasopharyngeal Swab  Result Value Ref Range Status   SARS Coronavirus 2 NEGATIVE NEGATIVE Final    Comment: (NOTE) SARS-CoV-2 target nucleic acids are NOT DETECTED. The SARS-CoV-2 RNA is generally detectable in upper and lower respiratory specimens during the acute phase of infection. Negative results do not preclude SARS-CoV-2 infection, do not rule out co-infections with other pathogens, and should not be used as the sole  basis for treatment or other patient management decisions. Negative results must be combined with clinical observations, patient history, and epidemiological information. The expected result is Negative. Fact Sheet for Patients: SugarRoll.be Fact Sheet for Healthcare Providers: https://www.woods-mathews.com/ This test is not yet approved or cleared by the [place] FDA and  has been authorized for detection and/or diagnosis of SARS-CoV-2 by FDA under an Emergency Use Authorization (EUA). This EUA will remain  in effect (meaning this test can be used) for the duration of the COVID-19 declaration under Section 56 4(b)(1) of the Act, 21 U.S.C. section 360bbb-3(b)(1), unless the authorization is terminated or revoked sooner. Performed at [facility], [place] [address] [place], [place] [address]      Radiology Studies: Ct Chest Wo Contrast  Result Date: 09/01/2019 CLINICAL DATA:  Coronavirus infection. Chest tube placement for right pneumothorax. EXAM: CT CHEST WITHOUT CONTRAST TECHNIQUE: Multidetector CT imaging of the chest was performed following the standard protocol without IV contrast. COMPARISON:  Chest radiography same day. CT chest 08/13/2019 FINDINGS: Cardiovascular: Heart size upper limits of normal. Minimal aortic atherosclerosis. No acute vascular finding visible. Mediastinum/Nodes: No mass or lymphadenopathy. Lungs/Pleura: Tracheostomy remains in place. Widespread patchy pulmonary infiltrates in the left lung persist, improved since late October. Better aeration in the left lower lobe. On the right, chest tube remains in place anteriorly, in direct communication with pleural air. Total amount of pleural air estimated at 5-10%. No evidence that this is increasing. Widespread patchy pulmonary infiltrates as seen previously, with a large air cyst in the right lower lung measuring 9.5 x 7.7 x 7.1 cm. I think this is in the right middle  lobe, but find it difficult to state that with certainty. Some chance this could be in the right lower lobe, less likely. In any case, this is likely subsequent to barotrauma. Upper Abdomen: Negative Musculoskeletal: Negative IMPRESSION: 1. Right chest tube remains in place anteriorly, in direct communication with pleural air. Total amount of pleural air estimated at 5-10%. 2. Widespread patchy pulmonary infiltrates, improved since late October. Better aeration in the left lower lobe. 3. Large air cyst in the right lower lung measuring 9.5 x 7.7 x 7.1 cm. This is presumably subsequent to barotrauma. This is slightly larger than on the study of 08/13/2019. 4. Tracheostomy remains in place. Aortic Atherosclerosis (ICD10-I70.0). Electronically Signed   By: Nelson Chimes M.D.   On: 09/01/2019 15:53   Ir Fluoro Guide Cv Line Left  Result Date: 09/02/2019 CLINICAL DATA:  Renal failure, needs durable venous access for hemodialysis. Currently using temporary dialysis catheter in the left IJ. History of right IJ occlusion. EXAM: TUNNELED HEMODIALYSIS CATHETER PLACEMENT WITH ULTRASOUND AND FLUOROSCOPIC GUIDANCE TECHNIQUE: The procedure, risks, benefits, and alternatives were explained to the family. Questions regarding the procedure were encouraged and answered. The family understands and consents to the procedure. Patient was receiving adequate prophylactic antibiotic coverage already. Patency of the left IJ  vein was confirmed with ultrasound with image documentation. An appropriate skin site was determined. Region was prepped using maximum barrier technique including cap and mask, sterile gown, sterile gloves, large sterile sheet, and Chlorhexidine as cutaneous antisepsis. The region was infiltrated locally with 1% lidocaine. Intravenous Fentanyl 65mg and Versed 1.557mwere administered as conscious sedation during continuous monitoring of the patient's level of consciousness and physiological / cardiorespiratory status  by the radiology RN, with a total moderate sedation time of 22 minutes. Under real-time ultrasound guidance, the left IJ vein was accessed with a 21 gauge micropuncture needle; the needle tip within the vein was confirmed with ultrasound image documentation. The 018 wire would not advance centrally. Needle exchanged over the 018 guidewire for transitional dilator, which allowed advancement of a stiff angled glidewire, negotiated to the SVC. The transitional dilator was exchanged for a 5 French angiographic catheter, advanced into the IVC. A Palindrome 23 hemodialysis catheter was tunneled from the left anterior chest wall approach to the left IJ dermatotomy site. The MPA catheter was exchanged over an Amplatz wire for serial vascular dilators which allow placement of a peel-away sheath, through which the catheter was advanced under intermittent fluoroscopy, positioned with its tips in the proximal right atrium. Spot chest radiograph confirms good catheter position. No pneumothorax. Catheter was flushed and primed per protocol. Catheter secured externally with O Prolene sutures. The left IJ dermatotomy site was closed with Dermabond. COMPLICATIONS: COMPLICATIONS None immediate FLUOROSCOPY TIME:  3 minutes 24 seconds; 12 mGy COMPARISON:  None IMPRESSION: 1. Technically successful placement of tunneled left IJ hemodialysis catheter with ultrasound and fluoroscopic guidance. Ready for routine use. ACCESS: Remains approachable for percutaneous intervention as needed. Electronically Signed   By: D Lucrezia Europe.D.   On: 09/02/2019 11:48   Ir UsKoreauide Vasc Access Left  Result Date: 09/02/2019 CLINICAL DATA:  Renal failure, needs durable venous access for hemodialysis. Currently using temporary dialysis catheter in the left IJ. History of right IJ occlusion. EXAM: TUNNELED HEMODIALYSIS CATHETER PLACEMENT WITH ULTRASOUND AND FLUOROSCOPIC GUIDANCE TECHNIQUE: The procedure, risks, benefits, and alternatives were explained to  the family. Questions regarding the procedure were encouraged and answered. The family understands and consents to the procedure. Patient was receiving adequate prophylactic antibiotic coverage already. Patency of the left IJ vein was confirmed with ultrasound with image documentation. An appropriate skin site was determined. Region was prepped using maximum barrier technique including cap and mask, sterile gown, sterile gloves, large sterile sheet, and Chlorhexidine as cutaneous antisepsis. The region was infiltrated locally with 1% lidocaine. Intravenous Fentanyl 7521mand Versed 1.5mg64mre administered as conscious sedation during continuous monitoring of the patient's level of consciousness and physiological / cardiorespiratory status by the radiology RN, with a total moderate sedation time of 22 minutes. Under real-time ultrasound guidance, the left IJ vein was accessed with a 21 gauge micropuncture needle; the needle tip within the vein was confirmed with ultrasound image documentation. The 018 wire would not advance centrally. Needle exchanged over the 018 guidewire for transitional dilator, which allowed advancement of a stiff angled glidewire, negotiated to the SVC. The transitional dilator was exchanged for a 5 French angiographic catheter, advanced into the IVC. A Palindrome 23 hemodialysis catheter was tunneled from the left anterior chest wall approach to the left IJ dermatotomy site. The MPA catheter was exchanged over an Amplatz wire for serial vascular dilators which allow placement of a peel-away sheath, through which the catheter was advanced under intermittent fluoroscopy, positioned with its tips in  the proximal right atrium. Spot chest radiograph confirms good catheter position. No pneumothorax. Catheter was flushed and primed per protocol. Catheter secured externally with O Prolene sutures. The left IJ dermatotomy site was closed with Dermabond. COMPLICATIONS: COMPLICATIONS None immediate  FLUOROSCOPY TIME:  3 minutes 24 seconds; 12 mGy COMPARISON:  None IMPRESSION: 1. Technically successful placement of tunneled left IJ hemodialysis catheter with ultrasound and fluoroscopic guidance. Ready for routine use. ACCESS: Remains approachable for percutaneous intervention as needed. Electronically Signed   By: Lucrezia Europe M.D.   On: 09/02/2019 11:48   Dg Chest Port 1 View  Result Date: 09/01/2019 CLINICAL DATA:  68 year old male with chest tube placement. COVID-19 positive. EXAM: PORTABLE CHEST 1 VIEW COMPARISON:  Chest radiograph dated 08/31/2019 and chest CT dated 08/13/2019 FINDINGS: Support apparatus in stable position. No significant interval change in the appearance of the lungs and bilateral confluent densities predominantly involving the peripheral/subpleural regions as well as bibasilar lungs since the prior radiograph. Right lung base cystic or bolus area appears similar to prior radiograph. No large or detectable pneumothorax. Faint linear density over the right apex appears similar to prior radiograph, likely artifactual and less likely represents pleural reflection. Stable cardiac silhouette. No acute osseous pathology. IMPRESSION: 1. No significant interval change in the appearance of the lungs compared to the prior radiograph. 2. Support apparatus in stable position. Electronically Signed   By: Anner Crete M.D.   On: 09/01/2019 09:55    Scheduled Meds:  aspirin  325 mg Per Tube Daily   atorvastatin  40 mg Oral q1800   chlorhexidine  15 mL Mouth/Throat BID   Chlorhexidine Gluconate Cloth  6 each Topical Daily   Chlorhexidine Gluconate Cloth  6 each Topical Q0600   darbepoetin (ARANESP) injection - NON-DIALYSIS  100 mcg Subcutaneous Q Wed-1800   feeding supplement (PRO-STAT SUGAR FREE 64)  60 mL Per Tube BID   Gerhardt's butt cream   Topical TID   mouth rinse  15 mL Mouth Rinse 10 times per day   midodrine  10 mg Per Tube TID WC   pantoprazole sodium  40 mg Per  Tube BID   [START ON 09/04/2019] pantoprazole sodium  40 mg Per Tube Daily   sodium zirconium cyclosilicate  10 g Oral Daily   sucralfate  1 g Oral Q6H   Continuous Infusions:  sodium chloride Stopped (09/01/19 0000)    ceFAZolin (ANCEF) IV Stopped (09/01/19 2234)   dextrose Stopped (08/16/19 1623)   feeding supplement (VITAL 1.5 CAL) 55 mL/hr at 09/02/19 1052   phenylephrine (NEO-SYNEPHRINE) Adult infusion 20 mcg/min (09/02/19 1416)     LOS: 39 days   Marylu Lund, MD [facility] Pager On Amion  If 7PM-7AM, please contact night-coverage 09/02/2019, 5:48 PM

## 2019-09-02 NOTE — Progress Notes (Addendum)
Patient ID: [name], male   DOB: 1951/02/29, 68 y.o.   MRN: [id number] S: remains on pressors. 1L off on HD yesterday. 28- 35% FiO2 w/ trach collar.  O:BP 127/71   Pulse 99   Temp 97.8 F (36.6 C) (Oral)   Resp 20   Ht 5\' 10"  (1.778 m)   Wt 83 kg   SpO2 99%   BMI 26.26 kg/m   Intake/Output Summary (Last 24 hours) at 09/02/2019 1608 Last data filed at 09/02/2019 1600 Gross per 24 hour  Intake 820.52 ml  Output -  Net 820.52 ml   Intake/Output: I/O last 3 completed shifts: In: 2724.7 [I.V.:914.6; Blood:325; NG/GT:1385; IV Piggyback:100] Out: 150 [Stool:100; Chest Tube:50]  Intake/Output this shift:  Total I/O In: 520.4 [I.V.:190.4; NG/GT:330] Out: -  Weight change:    Exam:   Trach collar, L hemiparesis, nods head to answer questions   L IJ temp cath   No jvd   Chest clear ant / lat, R chest tube in place    Cor reg no RG     Abd soft ntnd no ascites     Ext 2+ bilat UE edema, no LE edema    HD here - 3.5 - 4h , ~ 1L goals, hep yes   Today BUN 183 , creat 7.4  Assessment/Plan:   AKI/CKD stage 3- in setting of covid PNA and has remained oliguric/anuric. Started on CVVHD 08/07/19-08/24/19. Came off pressors and transferred to [facility] for iHD. Back on pressors though at [facility] because of stroke issues   Getting iHD, HD today  May need extra HD for azotemia later this week if still up  Now has TDC thanks IR  Continue HD 3x/ week TTS w/ extra prn  Unable to pull any volume off today  Per neuro, he needs to keep his SBP >120 after for optimal perfusion  COVID PNA- treated with remdesivir, dexamethasone, and convalescent plasma. Most recent covid test negative x 2 on 08/24/19 and 08/25/19  VDRF - off vent, on trach collar per PCCM  Sepsis/shock- better, on midodrine 10 tid, pressors prn  Right pneumothorax/emphyema/necrotic RLL- s/p chest tube with air leak. Per CT Surgery he is not a candidate for VATS, however concern that necrotic RLL is source of  hypercatabolic state. `  New cerebral and cerebellar strokes- per primary svc  A fib/RVR- per PCCM  GIB- acute s/p EGD which showed gastric ulcer with stigmata of recent bleeding s/p injection and clipping. Able to tolerate low dose heparin with CVVHD before stopping it  MSSA bacteremia- treated  Acinetobacter and pseudomonal pna- sp abx course  [name], MD 08/30/2019, 4:45 PM   Recent Labs  Lab 08/27/19 0336 08/28/19 0409 08/29/19 0528 08/30/19 0553 08/31/19 0800 09/01/19 0356 09/02/19 0420  NA 139 139 136 136 135 134* 136  K 4.8 6.2* 5.3* 5.7* 5.3* 5.0 5.9*  CL 101 101 99 98 99 98 100  CO2 21* 19* 23 21* 20* 20* 19*  GLUCOSE 117* 124* 115* 121* 125* 118* 117*  BUN 91* 146* 83* 136* 118* 145* 183*  CREATININE 4.44* 6.61* 4.21* 6.09* 5.24* 6.49* 7.44*  CALCIUM 9.3 9.4 8.5* 8.8* 8.6* 8.6* 8.9  PHOS  --   --   --  9.7*  --   --   --    Liver Function Tests: No results for input(s): AST, ALT, ALKPHOS, BILITOT, PROT, ALBUMIN in the last 168 hours. No results for input(s): LIPASE, AMYLASE in the last 168 hours. No results  for input(s): AMMONIA in the last 168 hours. CBC: Recent Labs  Lab 08/28/19 0409 08/29/19 0528 08/30/19 0553  09/01/19 0842 09/01/19 1600 09/01/19 2227 09/02/19 0307 09/02/19 0420  WBC 7.7 8.7 9.0   < > 8.1 8.4 8.6 8.8 9.4  NEUTROABS 5.7 6.6 6.9  --   --   --   --   --   --   HGB 7.9* 7.4* 7.2*   < > 7.6* 7.4* 7.6* 7.4* 7.7*  HCT 25.4* 24.2* 23.3*   < > 24.3* 23.3* 23.5* 23.4* 24.3*  MCV 93.4 96.0 95.5   < > 92.7 92.5 90.7 92.1 91.7  PLT 310 312 361   < > 326 341 347 367 365   < > = values in this interval not displayed.   Cardiac Enzymes: No results for input(s): CKTOTAL, CKMB, CKMBINDEX, TROPONINI in the last 168 hours. CBG: Recent Labs  Lab 09/02/19 0035 09/02/19 0409 09/02/19 0741 09/02/19 1135 09/02/19 1512  GLUCAP 115* 122* 110* 91 108*    Iron Studies: No results for input(s): IRON, TIBC, TRANSFERRIN, FERRITIN in the last  72 hours. Studies/Results: Ct Chest Wo Contrast  Result Date: 09/01/2019 CLINICAL DATA:  Coronavirus infection. Chest tube placement for right pneumothorax. EXAM: CT CHEST WITHOUT CONTRAST TECHNIQUE: Multidetector CT imaging of the chest was performed following the standard protocol without IV contrast. COMPARISON:  Chest radiography same day. CT chest 08/13/2019 FINDINGS: Cardiovascular: Heart size upper limits of normal. Minimal aortic atherosclerosis. No acute vascular finding visible. Mediastinum/Nodes: No mass or lymphadenopathy. Lungs/Pleura: Tracheostomy remains in place. Widespread patchy pulmonary infiltrates in the left lung persist, improved since late October. Better aeration in the left lower lobe. On the right, chest tube remains in place anteriorly, in direct communication with pleural air. Total amount of pleural air estimated at 5-10%. No evidence that this is increasing. Widespread patchy pulmonary infiltrates as seen previously, with a large air cyst in the right lower lung measuring 9.5 x 7.7 x 7.1 cm. I think this is in the right middle lobe, but find it difficult to state that with certainty. Some chance this could be in the right lower lobe, less likely. In any case, this is likely subsequent to barotrauma. Upper Abdomen: Negative Musculoskeletal: Negative IMPRESSION: 1. Right chest tube remains in place anteriorly, in direct communication with pleural air. Total amount of pleural air estimated at 5-10%. 2. Widespread patchy pulmonary infiltrates, improved since late October. Better aeration in the left lower lobe. 3. Large air cyst in the right lower lung measuring 9.5 x 7.7 x 7.1 cm. This is presumably subsequent to barotrauma. This is slightly larger than on the study of 08/13/2019. 4. Tracheostomy remains in place. Aortic Atherosclerosis (ICD10-I70.0). Electronically Signed   By: Nelson Chimes M.D.   On: 09/01/2019 15:53   Ir Fluoro Guide Cv Line Left  Result Date:  09/02/2019 CLINICAL DATA:  Renal failure, needs durable venous access for hemodialysis. Currently using temporary dialysis catheter in the left IJ. History of right IJ occlusion. EXAM: TUNNELED HEMODIALYSIS CATHETER PLACEMENT WITH ULTRASOUND AND FLUOROSCOPIC GUIDANCE TECHNIQUE: The procedure, risks, benefits, and alternatives were explained to the family. Questions regarding the procedure were encouraged and answered. The family understands and consents to the procedure. Patient was receiving adequate prophylactic antibiotic coverage already. Patency of the left IJ vein was confirmed with ultrasound with image documentation. An appropriate skin site was determined. Region was prepped using maximum barrier technique including cap and mask, sterile gown, sterile gloves, large sterile sheet, and  Chlorhexidine as cutaneous antisepsis. The region was infiltrated locally with 1% lidocaine. Intravenous Fentanyl 87mcg and Versed 1.5mg  were administered as conscious sedation during continuous monitoring of the patient's level of consciousness and physiological / cardiorespiratory status by the radiology RN, with a total moderate sedation time of 22 minutes. Under real-time ultrasound guidance, the left IJ vein was accessed with a 21 gauge micropuncture needle; the needle tip within the vein was confirmed with ultrasound image documentation. The 018 wire would not advance centrally. Needle exchanged over the 018 guidewire for transitional dilator, which allowed advancement of a stiff angled glidewire, negotiated to the SVC. The transitional dilator was exchanged for a 5 French angiographic catheter, advanced into the IVC. A Palindrome 23 hemodialysis catheter was tunneled from the left anterior chest wall approach to the left IJ dermatotomy site. The MPA catheter was exchanged over an Amplatz wire for serial vascular dilators which allow placement of a peel-away sheath, through which the catheter was advanced under  intermittent fluoroscopy, positioned with its tips in the proximal right atrium. Spot chest radiograph confirms good catheter position. No pneumothorax. Catheter was flushed and primed per protocol. Catheter secured externally with O Prolene sutures. The left IJ dermatotomy site was closed with Dermabond. COMPLICATIONS: COMPLICATIONS None immediate FLUOROSCOPY TIME:  3 minutes 24 seconds; 12 mGy COMPARISON:  None IMPRESSION: 1. Technically successful placement of tunneled left IJ hemodialysis catheter with ultrasound and fluoroscopic guidance. Ready for routine use. ACCESS: Remains approachable for percutaneous intervention as needed. Electronically Signed   By: Lucrezia Europe M.D.   On: 09/02/2019 11:48   Ir US Guide Vasc Access Left  Result Date: 09/02/2019 CLINICAL DATA:  Renal failure, needs durable venous access for hemodialysis. Currently using temporary dialysis catheter in the left IJ. History of right IJ occlusion. EXAM: TUNNELED HEMODIALYSIS CATHETER PLACEMENT WITH ULTRASOUND AND FLUOROSCOPIC GUIDANCE TECHNIQUE: The procedure, risks, benefits, and alternatives were explained to the family. Questions regarding the procedure were encouraged and answered. The family understands and consents to the procedure. Patient was receiving adequate prophylactic antibiotic coverage already. Patency of the left IJ vein was confirmed with ultrasound with image documentation. An appropriate skin site was determined. Region was prepped using maximum barrier technique including cap and mask, sterile gown, sterile gloves, large sterile sheet, and Chlorhexidine as cutaneous antisepsis. The region was infiltrated locally with 1% lidocaine. Intravenous Fentanyl 5mcg and Versed 1.5mg  were administered as conscious sedation during continuous monitoring of the patient's level of consciousness and physiological / cardiorespiratory status by the radiology RN, with a total moderate sedation time of 22 minutes. Under real-time  ultrasound guidance, the left IJ vein was accessed with a 21 gauge micropuncture needle; the needle tip within the vein was confirmed with ultrasound image documentation. The 018 wire would not advance centrally. Needle exchanged over the 018 guidewire for transitional dilator, which allowed advancement of a stiff angled glidewire, negotiated to the SVC. The transitional dilator was exchanged for a 5 French angiographic catheter, advanced into the IVC. A Palindrome 23 hemodialysis catheter was tunneled from the left anterior chest wall approach to the left IJ dermatotomy site. The MPA catheter was exchanged over an Amplatz wire for serial vascular dilators which allow placement of a peel-away sheath, through which the catheter was advanced under intermittent fluoroscopy, positioned with its tips in the proximal right atrium. Spot chest radiograph confirms good catheter position. No pneumothorax. Catheter was flushed and primed per protocol. Catheter secured externally with O Prolene sutures. The left IJ dermatotomy site was  closed with Dermabond. COMPLICATIONS: COMPLICATIONS None immediate FLUOROSCOPY TIME:  3 minutes 24 seconds; 12 mGy COMPARISON:  None IMPRESSION: 1. Technically successful placement of tunneled left IJ hemodialysis catheter with ultrasound and fluoroscopic guidance. Ready for routine use. ACCESS: Remains approachable for percutaneous intervention as needed. Electronically Signed   By: Lucrezia Europe M.D.   On: 09/02/2019 11:48   Dg Chest Port 1 View  Result Date: 09/01/2019 CLINICAL DATA:  68 year old male with chest tube placement. COVID-19 positive. EXAM: PORTABLE CHEST 1 VIEW COMPARISON:  Chest radiograph dated 08/31/2019 and chest CT dated 08/13/2019 FINDINGS: Support apparatus in stable position. No significant interval change in the appearance of the lungs and bilateral confluent densities predominantly involving the peripheral/subpleural regions as well as bibasilar lungs since the prior  radiograph. Right lung base cystic or bolus area appears similar to prior radiograph. No large or detectable pneumothorax. Faint linear density over the right apex appears similar to prior radiograph, likely artifactual and less likely represents pleural reflection. Stable cardiac silhouette. No acute osseous pathology. IMPRESSION: 1. No significant interval change in the appearance of the lungs compared to the prior radiograph. 2. Support apparatus in stable position. Electronically Signed   By: Anner Crete M.D.   On: 09/01/2019 09:55   . aspirin  325 mg Per Tube Daily  . atorvastatin  40 mg Oral q1800  . chlorhexidine  15 mL Mouth/Throat BID  . Chlorhexidine Gluconate Cloth  6 each Topical Daily  . Chlorhexidine Gluconate Cloth  6 each Topical Q0600  . darbepoetin (ARANESP) injection - NON-DIALYSIS  100 mcg Subcutaneous Q Wed-1800  . feeding supplement (PRO-STAT SUGAR FREE 64)  60 mL Per Tube BID  . Gerhardt's butt cream   Topical TID  . mouth rinse  15 mL Mouth Rinse 10 times per day  . midodrine  10 mg Per Tube TID WC  . pantoprazole sodium  40 mg Per Tube BID  . [START ON 09/04/2019] pantoprazole sodium  40 mg Per Tube Daily  . sodium zirconium cyclosilicate  10 g Oral Daily  . sucralfate  1 g Oral Q6H    BMET    Component Value Date/Time   NA 136 09/02/2019 0420   NA 143 11/08/2017 1030   K 5.9 (H) 09/02/2019 0420   CL 100 09/02/2019 0420   CO2 19 (L) 09/02/2019 0420   GLUCOSE 117 (H) 09/02/2019 0420   BUN 183 (H) 09/02/2019 0420   BUN 8 11/08/2017 1030   CREATININE 7.44 (H) 09/02/2019 0420   CREATININE 1.15 11/20/2014 1224   CALCIUM 8.9 09/02/2019 0420   GFRNONAA 7 (L) 09/02/2019 0420   GFRAA 8 (L) 09/02/2019 0420   CBC    Component Value Date/Time   WBC 9.4 09/02/2019 0420   RBC 2.65 (L) 09/02/2019 0420   HGB 7.7 (L) 09/02/2019 0420   HGB 15.8 11/08/2017 1030   HCT 24.3 (L) 09/02/2019 0420   HCT 46.6 11/08/2017 1030   PLT 365 09/02/2019 0420   PLT 202  11/08/2017 1030   MCV 91.7 09/02/2019 0420   MCV 93 11/08/2017 1030   MCH 29.1 09/02/2019 0420   MCHC 31.7 09/02/2019 0420   RDW 17.1 (H) 09/02/2019 0420   RDW 14.2 11/08/2017 1030   LYMPHSABS 0.8 08/30/2019 0553   LYMPHSABS 1.1 11/08/2017 1030   MONOABS 0.6 08/30/2019 0553   EOSABS 0.5 08/30/2019 0553   EOSABS 0.1 11/08/2017 1030   BASOSABS 0.0 08/30/2019 0553   BASOSABS 0.0 11/08/2017 1030

## 2019-09-02 NOTE — Progress Notes (Addendum)
NAME:  [name], MRN:  [id number], DOB:  Jan 31, 1951, LOS: 15 ADMISSION DATE:  07/25/2019, CONSULTATION DATE:  10/3 REFERRING MD:  Nadara Mustard, CHIEF COMPLAINT:  Dyspnea   Brief History   68 y/o male diagnosed with COVID on 9/29 presented to the [facility] ED on 10/2 with dyspnea, hypoxemia requiring intubation.  Admitted to [facility], developed AKI requiring CRRT, pneumothorax requiring chest tube and had a large bronchopleural fistula treated for 10 days with a bronchial blocker.  Tracheostomy placed on 10/30.  Past Medical History  GERD  Significant Hospital Events   10/03 Admit to [facility] from [facility] ER, start decadron and remdesivir, given tociluzimab; prone positioning 10/04 convalescent plasma 10/05 stop prone positioning 10/06 convalescent plasma; prone positioning again 10/09 start ABx 10/10 MSSA bacteremia, CVL d/ced; ID consulted; increase OG tube outpt 10/11 vent weaning trial started 10/13 fever, pneumothorax, pig tail chest tube placed 10/14 worsening hypotension and renal fx >> consulted nephrology; worsening PTX >> replaced chest tube; GNR in sputum >> ABx changed 10/15 start CRRT; endobronchial blocker placed for persistent air leak 10/16 endobronchial blocker repositioned, changed chest tube to water seal; melana with ABLA >> GI consulted; transfuse PRBC 10/17 persistent air leak, increased WOB; start nimbex gtt >> air leak decreased; EGD; A fib with RVR >> start amiodarone 10/18 transfuse PRBC; GI s/o 10/20 resume heparin gtt 10/21 stopped nimbex, chest tube to suction, stopped heparin drip because Hgb dropped again 10/22 TPA in chest tube, repositioned endobronchial blocker 10/23 deflated endobronchial balloon 10/24 small air leak  10/25 removed bronchial blocker 10/30 tracheostomy  11/1 D/C CRRT 11/2 Transfer to [facility] 11/3 Started on IHD 11/7 trach sutures removed 11/4 present, weaning on ATC  Consults:  ID Nephrology Gastroenterology PCCM  Procedures:  10/2 R IJ CVL >>  10/10 10/13 Rt pig tail chest tube >> 10/14 10/14 R 62F chest tube >> 10/14 L IJ HD cath >> 10/30 tracheostomy >>   Significant Diagnostic Tests:  10/2 CT angiogram chest >> extensive bilateral airspace disease predominantly posterior 10/8 doppler legs b/l >> no DVT 10/15 renal u/s >> normal 10/17 EGD >> non bleeding gastric ulcer 10/19 Echo >> EF 60 to 65%, hyperdynamic LV with small LVOT gradient 10/22 CT chest >> large collection of air in pleural space with fluid, pneumonia bilaterally R>L endobronchial blocker in place, chest tube anterior 10/31 CXR >> persistent small right pneumothorax despite chest in adequate position MRI brain 11/3 >> small acute to subacute infarcts involving bilateral cerebral cortex and right cerebellum.  Large b/l mastoid effusions  Micro Data:  9/20 SARS COV 2 >> POSITIVE 10/2 blood >> negative 10/9 blood >> MSSA 2/4 10/9 resp >> MSSA, Pseudomonas 10/13 blood >> negative 10/21 bronch wash bacterial >> pseduomonas, acinetobacter baumannii 10/21 bronch wash fungal >  10/21 bronch wash aspergillus antigen> negative 10/24 bronchoscopy wash> pseudomonas 10/28 Blood cx>>> 10/28 Sputum cx>>> Pseudomonas 11/1 SARS COV 2 > negative  Antimicrobials:  10/3 remdesivir > 10/6 10/3 actemra 10/3 decadron > 10/12 10/4 convalescent plasma 10/6 convalescent plasma  10/9 vancomycin > 10/10 10/9 cefepime > 10/10 10/10 ancef > 10/13 10/13 cefepime >10/20 10/14 anidulofungin > 10/15  10/20 ancef > 10/23 10/23 meropenem > >>>off  10/21 anidulofungin> 10/22 10/22 voriconazole > 10/23 10/28 Merrem >>11/2 10/28 Vanc >>10/30  10/31 Ancef >> Tentative stop date 11/23   Interim history/subjective:  RN reports some hypotension with removal of neo drip overnight, resulting in continued pressor support. Hgb remains stable this morning, Scheduled for iHD and  tunneled HD cath today  Drop in hgb to 6.8, I unit PRBC ordered, remains on ATC with slight pressor  support. Able to follow simple commands. Palliative meeting scheduled today   Objective   Blood pressure (!) 153/74, pulse (!) 106, temperature 99.1 F (37.3 C), temperature source Axillary, resp. rate (!) 25, height 5\' 10"  (1.778 m), weight 78.9 kg, SpO2 94 %.    FiO2 (%):  [28 %] 28 %   Intake/Output Summary (Last 24 hours) at 09/02/2019 0727 Last data filed at 09/02/2019 0600 Gross per 24 hour  Intake 1207.19 ml  Output -  Net 1207.19 ml   Filed Weights   08/28/19 1625 08/30/19 1449 08/30/19 1859  Weight: 79.7 kg 79.9 kg 78.9 kg    Examination: General: Chronically ill appearing elderly male on ATC, in NAD HEENT: Trach midline, secretions seen on dressing near trach,  MM pink/moist, PERRL,  Neuro: Alert and able to follow commands, appears very deconditioned  CV: s1s2 regular rate and rhythm, no murmur, rubs, or gallops,  PULM:  ATC in place, rhonchi bilaterally with productive sputum per trach, right chest tube in place to -20 suction  GI: soft, bowel sounds active in all 4 quadrants, non-tender, non-distended, tolerating TF Extremities: warm/dry, no edema  Skin: no rashes or lesions  Assessment & Plan:   Acute respiratory failure with hypoxemia - 2/2 COVID-19 (s/p remdsevir, actemra, convalescent plasma, steroids)  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  Now with persistent airleak / BPF despite endobronchial blocker placement (removed 10/25 due to MSSA bacteremia) P: Continues to tolerate ATC with PMV trials Continue CT per CTS, remains on suction with plans to transition to water seal later this weak if leak remains minimal  Further chest tube management per CTS  ESRD no on iHD P: Maintain iHD schedule per nephrology IR to place tunneled HD cath 11/10  Bilateral cerebral cortex and right cerebellum small acute to subacute infarcts Neuromuscular weakness/encephalopathy Persistent shock/vasoplegia Hypotension  P:  Midodrine for bp support, Neo drip stopped today with close monitoring of bp BP goal 120-140 per neurology  Biggest barrier to dispo remains bronchopleural fistula and profound deconditioning   MSSA bacteremia - Echocardiogram negative.  P: Ancef for 6 weeks of coverage for MSSA bacteremia, tenttaive stop date 11/23  Anemia of chronic disease with GI bleed and gastric ulcer  Continue BID PPI Trend CBC, hgb stable this morning    Rest per primary team   Best practice:  Diet: tube feeding Pain/Anxiety/Delirium protocol (if indicated): fentanyl for pain. VAP protocol (if indicated): yes DVT prophylaxis: sub cutaneous heparin GI prophylaxis: Pantoprazole for stress ulcer prophylaxis Glucose control: SSI Mobility: PT  Code Status: DNR if arrests Family Communication:  Disposition: ICU pending pressor liberation   Critical care :   Performed by: [name]  Total critical care time: 40 minutes  Critical care time was exclusive of separately billable procedures and treating other patients.  Critical care was necessary to treat or prevent imminent or life-threatening deterioration.  Critical care was time spent personally by me on the following activities: development of treatment plan with patient and/or surrogate as well as nursing, discussions with consultants, evaluation of patient's response to treatment, examination of patient, obtaining history from patient or surrogate, ordering and performing treatments and interventions, ordering and review of laboratory studies, ordering and review of radiographic studies, pulse oximetry and re-evaluation of patient's condition.   Signature:   [name], NP-C  [facility] After hours pager: [phone]. 09/02/2019, 7:27 AM

## 2019-09-02 NOTE — Progress Notes (Signed)
RT note- Attempted room air, sp02 88-89%, placed back to 28%.

## 2019-09-02 NOTE — Procedures (Signed)
Cortrak  Person Inserting Tube:  [name], RD Tube Type:  Cortrak - 43 inches Tube Location:  Right nare Initial Placement:  Stomach Secured by: Bridle Technique Used to Measure Tube Placement:  Documented cm marking at nare/ corner of mouth Cortrak Secured At:  74 cm Procedure Comments:  Cortrak Tube Team Note:  Consult received to place a Cortrak feeding tube.   No x-ray is required. RN may begin using tube.   If the tube becomes dislodged please keep the tube and contact the Cortrak team at [email or web address] ([email or web address]) for replacement.  If after hours and replacement cannot be delayed, place a NG tube and confirm placement with an abdominal x-ray.      Albert Matin, MS, RD, LDN, [facility] Inpatient Clinical Dietitian Pager # [phone] After hours/weekend pager # [phone]

## 2019-09-02 NOTE — Progress Notes (Signed)
SLP Cancellation Note  Patient Details Name: [name] MRN: [id number] DOB: 1951/05/29   Cancelled treatment:       Reason Eval/Treat Not Completed: Patient at procedure or test/unavailable.Pt is off floor at Interventional Radiology for [place]. Therapy will check back as able for treatment.   Rianne Degraaf L. Tivis Ringer, [place] Office number [phone] Pager [phone]  Juan Quam Laurice 09/02/2019, 9:42 AM

## 2019-09-02 NOTE — Progress Notes (Signed)
PT Cancellation Note  Patient Details Name: [name] MRN: [id number] DOB: [birth year]   Cancelled Treatment:    Reason Eval/Treat Not Completed: Patient at procedure or test/unavailable Pt is off floor at Interventional Radiology for [facility]. Therapy will check back as able for treatment.   Christopher Glasscock B. Migdalia Dk PT, DPT Acute Rehabilitation Services Pager [phone] Office [phone]    [place] 09/02/2019, 9:26 AM

## 2019-09-02 NOTE — Progress Notes (Signed)
Unable to UF pt. Due to low bp Dr. Jonnie Finner paged and made aware. Orders to continue HD tx without UF. Pt. Stable. Will monitor.

## 2019-09-03 ENCOUNTER — Inpatient Hospital Stay (HOSPITAL_COMMUNITY): Payer: Medicare HMO

## 2019-09-03 DIAGNOSIS — I6330 Cerebral infarction due to thrombosis of unspecified cerebral artery: Secondary | ICD-10-CM | POA: Diagnosis not present

## 2019-09-03 DIAGNOSIS — U071 COVID-19: Secondary | ICD-10-CM | POA: Diagnosis not present

## 2019-09-03 DIAGNOSIS — J9601 Acute respiratory failure with hypoxia: Secondary | ICD-10-CM | POA: Diagnosis not present

## 2019-09-03 DIAGNOSIS — Z7189 Other specified counseling: Secondary | ICD-10-CM | POA: Diagnosis not present

## 2019-09-03 DIAGNOSIS — Z515 Encounter for palliative care: Secondary | ICD-10-CM

## 2019-09-03 DIAGNOSIS — J9311 Primary spontaneous pneumothorax: Secondary | ICD-10-CM | POA: Diagnosis not present

## 2019-09-03 DIAGNOSIS — J9600 Acute respiratory failure, unspecified whether with hypoxia or hypercapnia: Secondary | ICD-10-CM | POA: Diagnosis not present

## 2019-09-03 DIAGNOSIS — Z9689 Presence of other specified functional implants: Secondary | ICD-10-CM | POA: Diagnosis not present

## 2019-09-03 DIAGNOSIS — N179 Acute kidney failure, unspecified: Secondary | ICD-10-CM | POA: Diagnosis not present

## 2019-09-03 LAB — BASIC METABOLIC PANEL
Anion gap: 14 (ref 5–15)
BUN: 75 mg/dL — ABNORMAL HIGH (ref 8–23)
CO2: 23 mmol/L (ref 22–32)
Calcium: 8.3 mg/dL — ABNORMAL LOW (ref 8.9–10.3)
Chloride: 98 mmol/L (ref 98–111)
Creatinine, Ser: 4.26 mg/dL — ABNORMAL HIGH (ref 0.61–1.24)
GFR calc Af Amer: 16 mL/min — ABNORMAL LOW (ref >60)
GFR calc non Af Amer: 14 mL/min — ABNORMAL LOW (ref >60)
Glucose, Bld: 118 mg/dL — ABNORMAL HIGH (ref 70–99)
Potassium: 4.2 mmol/L (ref 3.5–5.1)
Sodium: 135 mmol/L (ref 135–145)

## 2019-09-03 LAB — CBC
HCT: 24.2 % — ABNORMAL LOW (ref 39.0–52.0)
Hemoglobin: 7.6 g/dL — ABNORMAL LOW (ref 13.0–17.0)
MCH: 28.7 pg (ref 26.0–34.0)
MCHC: 31.4 g/dL (ref 30.0–36.0)
MCV: 91.3 fL (ref 80.0–100.0)
Platelets: 304 10*3/uL (ref 150–400)
RBC: 2.65 MIL/uL — ABNORMAL LOW (ref 4.22–5.81)
RDW: 16.2 % — ABNORMAL HIGH (ref 11.5–15.5)
WBC: 8.7 10*3/uL (ref 4.0–10.5)
nRBC: 0.2 % (ref 0.0–0.2)

## 2019-09-03 LAB — GLUCOSE, CAPILLARY
Glucose-Capillary: 112 mg/dL — ABNORMAL HIGH (ref 70–99)
Glucose-Capillary: 101 mg/dL — ABNORMAL HIGH (ref 70–99)
Glucose-Capillary: 115 mg/dL — ABNORMAL HIGH (ref 70–99)
Glucose-Capillary: 112 mg/dL — ABNORMAL HIGH (ref 70–99)

## 2019-09-03 MED ORDER — B COMPLEX-C PO TABS
1.0000 | ORAL_TABLET | Freq: Every day | ORAL | Status: DC
  Administered 2019-09-03 – 2019-09-12 (×10): 1
  Filled 2019-09-03 (×10): qty 1

## 2019-09-03 MED ORDER — HEPARIN SODIUM (PORCINE) 5000 UNIT/ML IJ SOLN
5000.0000 [IU] | Freq: Three times a day (TID) | INTRAMUSCULAR | Status: DC
  Administered 2019-09-03 – 2019-10-03 (×82): 5000 [IU] via SUBCUTANEOUS
  Filled 2019-09-03 (×77): qty 1

## 2019-09-03 MED ORDER — CHLORHEXIDINE GLUCONATE CLOTH 2 % EX PADS
6.0000 | MEDICATED_PAD | Freq: Every day | CUTANEOUS | Status: DC
  Administered 2019-09-04 – 2019-09-06 (×3): 6 via TOPICAL

## 2019-09-03 MED ORDER — NEPRO/CARBSTEADY PO LIQD
1000.0000 mL | ORAL | Status: DC
  Administered 2019-09-03 – 2019-09-12 (×7): 1000 mL via ORAL
  Filled 2019-09-03 (×14): qty 1000

## 2019-09-03 MED ORDER — PRO-STAT SUGAR FREE PO LIQD
30.0000 mL | Freq: Four times a day (QID) | ORAL | Status: DC
  Administered 2019-09-03 – 2019-09-12 (×32): 30 mL
  Filled 2019-09-03 (×31): qty 30

## 2019-09-03 NOTE — Progress Notes (Addendum)
[facility] Progress Note  Subjective: seen in room, not really verbal  Vitals:   09/03/19 1100 09/03/19 1200 09/03/19 1202 09/03/19 1300  BP: 137/68 112/70 112/70 (!) 149/74  Pulse: 97 (!) 103 100 91  Resp: (!) 25 (!) 28 (!) 23 (!) 21  Temp:  99.4 F (37.4 C)    TempSrc:  Oral    SpO2: 98% 95% 98% 100%  Weight:      Height:        Inpatient medications: . aspirin  325 mg Per Tube Daily  . atorvastatin  40 mg Oral q1800  . B-complex with vitamin C  1 tablet Per Tube Daily  . chlorhexidine  15 mL Mouth/Throat BID  . Chlorhexidine Gluconate Cloth  6 each Topical Daily  . Chlorhexidine Gluconate Cloth  6 each Topical Q0600  . darbepoetin (ARANESP) injection - NON-DIALYSIS  100 mcg Subcutaneous Q Wed-1800  . feeding supplement (PRO-STAT SUGAR FREE 64)  30 mL Per Tube QID  . Gerhardt's butt cream   Topical TID  . heparin injection (subcutaneous)  5,000 Units Subcutaneous Q8H  . mouth rinse  15 mL Mouth Rinse 10 times per day  . midodrine  10 mg Per Tube TID WC  . [START ON 09/04/2019] pantoprazole sodium  40 mg Per Tube Daily  . sodium zirconium cyclosilicate  10 g Oral Daily  . sucralfate  1 g Oral Q6H   . sodium chloride Stopped (09/01/19 0000)  .  ceFAZolin (ANCEF) IV Stopped (09/02/19 2230)  . dextrose Stopped (08/16/19 1623)  . feeding supplement (NEPRO CARB STEADY)    . phenylephrine (NEO-SYNEPHRINE) Adult infusion Stopped (09/02/19 1802)   sodium chloride, acetaminophen (TYLENOL) oral liquid 160 mg/5 mL, dextrose, heparin, heparin, heparin, metoprolol tartrate, pneumococcal 23 valent vaccine, polyethylene glycol    Exam:  Exam:   Trach collar, L hemiparesis, nods head to answer questions   L IJ temp cath   No jvd   Chest clear ant / lat, R chest tube in place    Cor reg no RG     Abd soft ntnd no ascites     Ext 2+ bilat UE edema, no LE edema    HD here - 3.5 - 4h , ~ 0- 1L goals, hep yes   Assessment/Plan:   AKI/CKD stage 3- in setting of  covid PNA and has remained oliguric/anuric. Started on CVVHD 08/07/19-08/24/19. Came off pressors and transferred to [facility] for iHD  HD TTS for now. B/Cr much better  Remains off pressors, will do HD upstairs on thursday  No extra volume on exam, unable to pull anything yesterday  Per neuro keep SBP >120 for optimal perfusion  COVID PNA- treated with remdesivir, dexamethasone, and convalescent plasma. Most recent covid test negative x 2 on 08/24/19 and 08/25/19  VDRF - off vent, on trach collar per PCCM  Sepsis/shock- better, on midodrine 10 tid, pressors prn  Right pneumothorax/emphyema/necrotic RLL- s/p chest tube with air leak. Per CT Surgery he is not a candidate for VATS, however concern that necrotic RLL is source of hypercatabolic state. `  New cerebral and cerebellar strokes- per primary svc  A fib/RVR- per PCCM  GIB- acute s/p EGD which showed gastric ulcer with stigmata of recent bleeding s/p injection and clipping. Able to tolerate low dose heparin with CVVHD before stopping it  MSSA bacteremia- treated  Acinetobacter and pseudomonal pna- sp abx course   Rob Diaz Crago 09/03/2019, 1:38 PM  Iron/TIBC/Ferritin/ %Sat    Component Value  Date/Time   IRON 71 08/20/2019 1643   TIBC 217 (L) 08/20/2019 1643   FERRITIN 681 (H) 08/06/2019 0610   IRONPCTSAT 33 08/20/2019 1643   Recent Labs  Lab 08/30/19 0553  09/02/19 0649 09/03/19 0440  NA 136   < >  --  135  K 5.7*   < >  --  4.2  CL 98   < >  --  98  CO2 21*   < >  --  23  GLUCOSE 121*   < >  --  118*  BUN 136*   < >  --  75*  CREATININE 6.09*   < >  --  4.26*  CALCIUM 8.8*   < >  --  8.3*  PHOS 9.7*  --   --   --   INR  --   --  1.2  --    < > = values in this interval not displayed.   No results for input(s): AST, ALT, ALKPHOS, BILITOT, PROT in the last 168 hours. Recent Labs  Lab 09/03/19 0440  WBC 8.7  HGB 7.6*  HCT 24.2*  PLT 304

## 2019-09-03 NOTE — Progress Notes (Signed)
  Speech Language Pathology Treatment: Cognitive-Linquistic;Passy Muir Speaking valve  Patient Details Name: [name] MRN: [id number] DOB: [birth year] Today's Date: 09/03/2019 Time: 1207-1222 SLP Time Calculation (min) (ACUTE ONLY): 15 min  Assessment / Plan / Recommendation Clinical Impression  Pt seen for ongoing cognitive linguistic and PMV therapy.  RT in room on SLP arrival and changed inner cannula and provided tracheal suctioning.  Pt tolerated PMV placement for 15 minutes with no change in vital signs and no back pressure.  With valve in place wet, congested breath sounds were noted, but pt made no attempts to phonate.  Pt coughed x1 when valve was removed with valve replaced cough sounded strong but no secretions were brought up.  Pt followed (or attempted to follow) 3 of 5 commands.  Pt did not respond to personal yes/no questions, but did use head nod and shake (1 each) in response to questions from wife.  Unclear how reliable yes/no response is.  Pt did not make good eye contact with pt or wife, but was tracking to wife on R side of pt.  Pt did not attend to SLP on his left side.  Attempted to elicit phonation and/or articulatory attempts with song.  Pt did not make effort to sing with SLP and wife.  RN reports possible continuing goals of care meeting.  SLP will continue to follow for therapy as appropriate.    HPI HPI: 68 year old with a history of GERD and BPH who presented to [facility] ED with S OB and was found to be hypoxic.  He was initially diagnosed with COVID-19 September 20.  1 to 2 days prior to his presentation he developed worsening shortness of breath with anorexia. Admitted on 10/3, intubated.  EGD on 10/17 showed normal esophagus but excessive gastric fluid. CT scan of the chest on 10/21 showed findings consistent with an empyema as well as persistent pneumothorax.  Bedside bronchoscopy revealed material in the airway. Trached on 10/30. On ATC by 10/31.Additionally MRI  on 11/4 showed There is cortical restricted diffusion posteriorly in the right frontal lobe consistent with acute infarct. Additional smaller regions of diffusion abnormality are present in the posterior left frontal lobe, anterior left frontal lobe, and right greater than left medial parietal lobes as well as right cerebellum.       SLP Plan  Continue with current plan of care       Recommendations         Patient may use Passy-Muir Speech Valve: Intermittently with supervision;During all therapies with supervision PMSV Supervision: Full MD: Please consider changing trach tube to : Smaller size;Cuffless         Oral Care Recommendations: Oral care QID Follow up Recommendations: Inpatient Rehab SLP Visit Diagnosis: Aphonia (R49.1);Cognitive communication deficit (R41.841) Plan: Continue with current plan of care       [place], [place], [place] Office: [phone] 09/03/2019, 12:53 PM

## 2019-09-03 NOTE — Progress Notes (Signed)
Initial Nutrition Assessment  DOCUMENTATION CODES:   Not applicable  INTERVENTION:   Tube Feeding:  Nepro at 45 ml/hr Pro-Stat 30 mL QID Provides 148 g of protein, 2344 kcals and 788 mL of free water. Meets 100% estimated calorie and protein needs  Add B-complex with C; switch to Rena-Vit when able to take meds by mouth  NUTRITION DIAGNOSIS:   Inadequate oral intake related to inability to eat(pt sedated and ventilated) as evidenced by NPO status.  Being addressed via TF   GOAL:   Patient will meet greater than or equal to 90% of their needs  Met via TF   MONITOR:   Labs, TF tolerance, Skin, I & O's  REASON FOR ASSESSMENT:   Consult Enteral/tube feeding initiation and management  ASSESSMENT:   69 y/o male admitted with PNA and COVID 19  RD working remotely.  10/28 Cortrak placed, tip in the stomach 10/30 S/P tracheostomy 11/01 & 11/2 COVID negative  11/02 Transferred to [facility] for iHD 11/03 started on iHD; MRI brain showed small infarcts involving b/l cerebral cortex and right cerebellum, and large b/l mastoid effusions. 11/10 Cortrak re-inserted (came out over the weekend?), NG removed  HD yesterday, receiving iHD 3x weekly with additional sessions prn. Yesterday unable to pull any fluid due to hypotension requiring pressors  Vital 1.5 at 55 ml/hr, Pro-Stat 60 mL BID  Hyperkalemia, now being treated with Lokelma since 11/6.  Current potassium 4.2 post iHD yesterday. Potassium has been as high as 5.9 post Lokelma initiation. . Last Phosphorus 9.7 (11/7). Plan to switch to renal TF formula today and monitor tolerance and electrolytes  SLP following for PMV and potential diet advancement   Labs: reviewed Meds: Lokelma daily   Diet Order:   Diet Order            Diet NPO time specified  Diet effective midnight              EDUCATION NEEDS:   No education needs have been identified at this time  Skin:  Skin Assessment: Skin Integrity Issues: Skin  Integrity Issues:: DTI, Other (Comment) DTI: buttocks Stage I: lip Other: wound to penis  Last BM:  11/11 rectal tube  Height:   Ht Readings from Last 1 Encounters:  08/22/19 _0  (1.778 m)    Weight:   Wt Readings from Last 1 Encounters:  09/02/19 84 kg    Ideal Body Weight:  75.45 kg  BMI:  Body mass index is 26.57 kg/m.  Estimated Nutritional Needs:   Kcal:  2200-2400  Protein:  130-150 gm  Fluid:  >1.9L/day   Kerman Passey MS, RDN, LDN, CNSC [phone] Pager  [phone] Weekend/On-Call Pager

## 2019-09-03 NOTE — Progress Notes (Signed)
NAME:  [name], MRN:  [id number], DOB:  Mar 06, 1951, LOS: 65 ADMISSION DATE:  07/25/2019, CONSULTATION DATE:  10/3 REFERRING MD:  Nadara Mustard, CHIEF COMPLAINT:  Dyspnea   Brief History   68 y/o male diagnosed with COVID on 9/29 presented to the [facility] ED on 10/2 with dyspnea, hypoxemia requiring intubation.  Admitted to [facility], developed AKI requiring CRRT, pneumothorax requiring chest tube and had a large bronchopleural fistula treated for 10 days with a bronchial blocker.  Tracheostomy placed on 10/30.  Past Medical History  GERD  Significant Hospital Events   10/03 Admit to [facility] from [facility] ER, start decadron and remdesivir, given tociluzimab; prone positioning 10/04 convalescent plasma 10/05 stop prone positioning 10/06 convalescent plasma; prone positioning again 10/09 start ABx 10/10 MSSA bacteremia, CVL d/ced; ID consulted; increase OG tube outpt 10/11 vent weaning trial started 10/13 fever, pneumothorax, pig tail chest tube placed 10/14 worsening hypotension and renal fx >> consulted nephrology; worsening PTX >> replaced chest tube; GNR in sputum >> ABx changed 10/15 start CRRT; endobronchial blocker placed for persistent air leak 10/16 endobronchial blocker repositioned, changed chest tube to water seal; melana with ABLA >> GI consulted; transfuse PRBC 10/17 persistent air leak, increased WOB; start nimbex gtt >> air leak decreased; EGD; A fib with RVR >> start amiodarone 10/18 transfuse PRBC; GI s/o 10/20 resume heparin gtt 10/21 stopped nimbex, chest tube to suction, stopped heparin drip because Hgb dropped again 10/22 TPA in chest tube, repositioned endobronchial blocker 10/23 deflated endobronchial balloon 10/24 small air leak  10/25 removed bronchial blocker 10/30 tracheostomy  11/1 D/C CRRT 11/2 Transfer to [facility] 11/3 Started on IHD 11/7 trach sutures removed 11/4 present, weaning on ATC  Consults:  ID Nephrology Gastroenterology PCCM  Procedures:  10/2 R IJ CVL >>  10/10 10/13 Rt pig tail chest tube >> 10/14 10/14 R 34F chest tube >> 10/14 L IJ HD cath >> 11/10 10/30 tracheostomy >>  11/10 tunneled HD cath >>  Significant Diagnostic Tests:  10/2 CT angiogram chest >> extensive bilateral airspace disease predominantly posterior 10/8 doppler legs b/l >> no DVT 10/15 renal u/s >> normal 10/17 EGD >> non bleeding gastric ulcer 10/19 Echo >> EF 60 to 65%, hyperdynamic LV with small LVOT gradient 10/22 CT chest >> large collection of air in pleural space with fluid, pneumonia bilaterally R>L endobronchial blocker in place, chest tube anterior 10/31 CXR >> persistent small right pneumothorax despite chest in adequate position MRI brain 11/3 >> small acute to subacute infarcts involving bilateral cerebral cortex and right cerebellum.  Large b/l mastoid effusions  Micro Data:  9/20 SARS COV 2 >> POSITIVE 10/2 blood >> negative 10/9 blood >> MSSA 2/4 10/9 resp >> MSSA, Pseudomonas 10/13 blood >> negative 10/21 bronch wash bacterial >> pseduomonas, acinetobacter baumannii 10/21 bronch wash fungal >  10/21 bronch wash aspergillus antigen> negative 10/24 bronchoscopy wash> pseudomonas 10/28 Blood cx>>> 10/28 Sputum cx>>> Pseudomonas 11/1 SARS COV 2 > negative  Antimicrobials:  10/3 remdesivir > 10/6 10/3 actemra 10/3 decadron > 10/12 10/4 convalescent plasma 10/6 convalescent plasma  10/9 vancomycin > 10/10 10/9 cefepime > 10/10 10/10 ancef > 10/13 10/13 cefepime >10/20 10/14 anidulofungin > 10/15  10/20 ancef > 10/23 10/23 meropenem > >>>off  10/21 anidulofungin> 10/22 10/22 voriconazole > 10/23 10/28 Merrem >>11/2 10/28 Vanc >>10/30  10/31 Ancef >> Tentative stop date 11/23   Interim history/subjective:  Some hypotension reported during dialysis 11/10, currently remains off pressor support. Continue to remains on ATC and alert.  Objective   Blood pressure 135/74, pulse (!) 111, temperature 100 F (37.8 C), temperature source  Oral, resp. rate 18, height 5\' 10"  (1.778 m), weight 84 kg, SpO2 93 %.    FiO2 (%):  [28 %-40 %] 28 %   Intake/Output Summary (Last 24 hours) at 09/03/2019 0901 Last data filed at 09/03/2019 0500 Gross per 24 hour  Intake 1276.12 ml  Output -179 ml  Net 1455.12 ml   Filed Weights   09/02/19 1230 09/02/19 1240 09/02/19 1635  Weight: 83 kg 83 kg 84 kg    Examination: General: Chronically ill appearing elderly male on ATC, in NAD HEENT: Trach midline with pink tinged secretions  MM pink/moist, PERRL,  Neuro: Alert and able to follow commands, very deconditioned  CV: s1s2 regular rate and rhythm, no murmur, rubs, or gallops,  PULM:  ATC in place, rhonchi bilaterally, productive sputum per trach, right chest tube in place to -20cm suction  GI: soft, bowel sounds active in all 4 quadrants, non-tender, non-distended, tolerating TF Extremities: warm/dry, no edema  Skin: no rashes or lesions  Assessment & Plan:   Acute respiratory failure with hypoxemia - 2/2 COVID-19 (s/p remdsevir, actemra, convalescent plasma, steroids)  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  Now with persistent airleak / BPF despite endobronchial blocker placement (removed 10/25 due to MSSA bacteremia) P: Tolerating ATC well Attempt PMV trials Remains on suctions per right CT, CTS to possible attempt water seal later this week  No leak seen per CT   ESRD no on iHD P: Tunneled HD cath placed 11/10 Nephrology following HD per nephrology   Bilateral cerebral cortex and right cerebellum small acute to subacute infarcts Neuromuscular weakness/encephalopathy Persistent shock/vasoplegia Hypotension  P: Appears to no longer require pressor support BP goal 120-140 per neurology  PT/OT evals as able   MSSA bacteremia - Echocardiogram negative.  P: Ancef for total of 6 weeks tentative stop date 11/23  Anemia of chronic disease with GI bleed and gastric ulcer   Continue BID PPI Trend CBC  Rest per primary team   Best practice:  Diet: tube feeding Pain/Anxiety/Delirium protocol (if indicated): fentanyl for pain. VAP protocol (if indicated): yes DVT prophylaxis: sub cutaneous heparin GI prophylaxis: Pantoprazole for stress ulcer prophylaxis Glucose control: SSI Mobility: PT  Code Status: DNR if arrests Family Communication:  Disposition: ICU pending pressor liberation   Critical care :   Performed by: [name]  Total critical care time: 35 minutes  Critical care time was exclusive of separately billable procedures and treating other patients.  Critical care was necessary to treat or prevent imminent or life-threatening deterioration.  Critical care was time spent personally by me on the following activities: development of treatment plan with patient and/or surrogate as well as nursing, discussions with consultants, evaluation of patient's response to treatment, examination of patient, obtaining history from patient or surrogate, ordering and performing treatments and interventions, ordering and review of laboratory studies, ordering and review of radiographic studies, pulse oximetry and re-evaluation of patient's condition.   Signature:   [name], NP-C [facility] After hours pager: [phone]. 09/03/2019, 9:01 AM

## 2019-09-03 NOTE — Progress Notes (Signed)
Daily Progress Note   Patient Name: [name]       Date: 09/03/2019 DOB: 1950-11-28  Age: 68 y.o. MRN#: [id number] Attending Physician: Desiree Hane, MD Primary Care Physician: Kathyrn Drown, MD Admit Date: 07/25/2019  Reason for Consultation/Follow-up: Establishing goals of care  Subjective: Patient status discussed with RN- in progression meeting, discussion was had regarding possible placement at [facility] facility. Drue Dun, RN requesting PMT f/u d/t during [place] meeting- family appeared to indicate they would not wish for patient to be in long term care setting.  Patient requiring pressor support during HD. Otherwise- per RN- primary team feels they have maximized medical care- patient neuro status not improving.   ROS  Length of Stay: 40  Current Medications: Scheduled Meds:  . aspirin  325 mg Per Tube Daily  . atorvastatin  40 mg Oral q1800  . B-complex with vitamin C  1 tablet Per Tube Daily  . chlorhexidine  15 mL Mouth/Throat BID  . Chlorhexidine Gluconate Cloth  6 each Topical Daily  . Chlorhexidine Gluconate Cloth  6 each Topical Q0600  . [START ON 09/04/2019] Chlorhexidine Gluconate Cloth  6 each Topical Q0600  . darbepoetin (ARANESP) injection - NON-DIALYSIS  100 mcg Subcutaneous Q Wed-1800  . feeding supplement (PRO-STAT SUGAR FREE 64)  30 mL Per Tube QID  . Gerhardt's butt cream   Topical TID  . heparin injection (subcutaneous)  5,000 Units Subcutaneous Q8H  . mouth rinse  15 mL Mouth Rinse 10 times per day  . midodrine  10 mg Per Tube TID WC  . [START ON 09/04/2019] pantoprazole sodium  40 mg Per Tube Daily  . sodium zirconium cyclosilicate  10 g Oral Daily  . sucralfate  1 g Oral Q6H    Continuous Infusions: . sodium chloride Stopped (09/01/19 0000)  .   ceFAZolin (ANCEF) IV Stopped (09/02/19 2230)  . dextrose Stopped (08/16/19 1623)  . feeding supplement (NEPRO CARB STEADY)    . phenylephrine (NEO-SYNEPHRINE) Adult infusion Stopped (09/02/19 1802)    PRN Meds: sodium chloride, acetaminophen (TYLENOL) oral liquid 160 mg/5 mL, dextrose, heparin, heparin, heparin, metoprolol tartrate, pneumococcal 23 valent vaccine, polyethylene glycol  Physical Exam          Vital Signs: BP (!) 159/75   Pulse 87   Temp 99.1 F (37.3 C) (  Oral)   Resp 20   Ht 5\' 10"  (1.778 m)   Wt 84 kg   SpO2 99%   BMI 26.57 kg/m  SpO2: SpO2: 99 % O2 Device: O2 Device: Tracheostomy Collar O2 Flow Rate: O2 Flow Rate (L/min): 5 L/min  Intake/output summary:   Intake/Output Summary (Last 24 hours) at 09/03/2019 1630 Last data filed at 09/03/2019 1500 Gross per 24 hour  Intake 1298.88 ml  Output -179 ml  Net 1477.88 ml   LBM: Last BM Date: 09/01/19 Baseline Weight: Weight: 96 kg Most recent weight: Weight: 84 kg       Palliative Assessment/Data: PPS: 10%      Patient Active Problem List   Diagnosis Date Noted  . Cerebral thrombosis with cerebral infarction 08/28/2019  . Tracheostomy status ([place])   . Acute respiratory distress syndrome (ARDS) due to COVID-19 virus ([place])   . Chest tube in place   . Primary spontaneous pneumothorax   . Acute respiratory failure ([place])   . Gastric ulcer with hemorrhage 08/10/2019  . Constipation 08/09/2019  . Atrial fibrillation with RVR ([place]) 08/07/2019  . Acute renal failure (ARF) ([place]) 08/06/2019  . GI bleed 08/06/2019  . Pneumothorax on right   . Fluid overload 07/27/2019  . Pneumonia due to COVID-19 virus 07/25/2019  . Acute respiratory failure due to COVID-19 ([place]) 07/25/2019  . AKI (acute kidney injury) ([place]) 07/25/2019  . Dyspepsia 11/26/2014  . BPH (benign prostatic hyperplasia) 10/31/2013  . Achilles rupture 04/05/2011  . S/P Achilles tendon repair 04/05/2011    Palliative Care Assessment & Plan    Patient Profile: 68 y.o. male with a medical history of GERD. He was admitted initially on 07/25/19 at [facility] (transferred to [facility]) with complaints of shortness of breath after being diagnosed with COVID on 07/13/19. He completed his treatment course of remdesivir, decadron, and convalescent plasma in addition to requiring intubation for respiratory support and IV antibiotics for possible pneumonia. He was found to have MSSA bacteremia on 08/02/19 and started on Ancef. During his hospital course he developed a pneumothorax requiring pig tail chest tube (10/13), developed worsening hypotension and renal failure (10/14) started on CRRT (10/15) later transitioning to iHD (11/3). Mr. Serio required a tracheostomy on 10/30 and have been able to wean on ATC (11/4). MRI on 11/3 showed small acute to subacute infarcts involving cerebral cortex and right cerebellum and large bilateral mastoid effusions. He remains on pressor support. Palliative Medicine team consulted for goals of care.  Assessment/Recommendations/Plan   Spoke with patient's spouse for followup- she preferred to meet in person- planned f/u meeting tomorrow at 50    Code Status:  DNR  Prognosis:   Unable to determine  Discharge Planning:  To Be Determined  Care plan was discussed with patient's RN and patient's spouse.  Thank you for allowing the Palliative Medicine Team to assist in the care of this patient.   Time In: 1600 Time Out: 1635 Total Time 35 mins Prolonged Time Billed no      Greater than 50%  of this time was spent counseling and coordinating care related to the above assessment and plan.  Mariana Kaufman, AGNP-C Palliative Medicine   Please contact Palliative Medicine Team phone at [phone] for questions and concerns.

## 2019-09-03 NOTE — Progress Notes (Signed)
[facility]  PROGRESS NOTE  [name] QZR:[id number] DOB: 06-Nov-1950 DOA: 07/25/2019 PCP: Albert Drown, MD Admit date - 07/25/2019   Admitting Physician Kristopher Oppenheim, DO  Outpatient Primary MD for the patient is Albert Drown, MD  LOS - 46 Brief Narrative   [name] is a 68 y.o. year old male with medical history significant for GERD who presented on 07/25/2019 as a transfer from [facility], ED with dyspnea found to have multifocal infiltrates on CT chet, hypoxia requiring intubation and history admission to [facility] for Covid pneumonia.    Prolonged Hospital course complicated by [id number] pneumonia, ARDS, AKI requiring CRRT, pneumothorax of bronchopleural fistula requiring Pleur-evac, prolonged mechanical ventilation status post tracheostomy on 10/30, MSSA bacteremia, intermittent pressor support required with dialysis  Subjective  [name] had no acute events.   A & P    1. Acute hypoxic respiratory failure secondary to Covid pneumonia.  Completed remdesivir/Decadron therapy.  Covid test negative x2 (11/1, 11/2).  On trach collar per PCCM chronically requiring 5 L maintaining normal oxygen saturation. 2. AKI on CKD Stage 3( baseline cr around 1.5) requiring intermittent HD likely ATN related to septic shock.  CRRT DC'd 11/1, dialysis (started 11/2) per nephrology 3. Septic shock, resolved, persistent hypotension.  Requiring as needed pressors during dialysis, continue midodrine 10 mg 3 times daily 4. Right pneumothorax/empyema/necrotic right lower lobe/bronchopleural fistula.  Chest tube in place, CT surgery following, plan for waterseal if airleak remains resolved, may be candidate for endobronchial valve  5. Acute on chronic anemia, worsened in setting of GI bleed/gastric ulcer status post EGD with culprit vessel and large ulcer.  Hemoglobin currently stable.  Continue PPI twice daily 6. New cerebral/cerebellar strokes likely due to hypotension secondary to dialysis  and sepsis and deconditioning. PAF is also a potential etiology.  Neurology recommends aspirin, permissive hypertension (goal BP 120-140), not for anticoagulation given history of GI bleed 7. Atrial fibrillation, New onset unable to anticoagulate due to recent GI bleeding, continue aspirin, currently rate controlled 8. MSSA bacteremia, continue cefazolin, stop date 11/23 (6 total weeks), TTE negative but still high concern for infective endocarditis  9. Pseudomonas and Acinetobacter NP BAL specimen, resolved.  Completed 10-day treatment of meropenem Pressure injuries, not present on admission.  Pressure Injury 08/16/19 Lip Medial;Lower Stage I -  Intact skin with non-blanchable redness of a localized area usually over a bony prominence. healing  (Active)  08/16/19 2000  Location: Lip  Location Orientation: Medial;Lower  Staging: Stage I -  Intact skin with non-blanchable redness of a localized area usually over a bony prominence.  Wound Description (Comments): healing   Present on Admission: No     Pressure Injury 08/24/19 Buttocks Right;Left;Mid Deep Tissue Injury - Purple or maroon localized area of discolored intact skin or blood-filled blister due to damage of underlying soft tissue from pressure and/or shear. (Active)  08/24/19 0800  Location: Buttocks  Location Orientation: Right;Left;Mid  Staging: Deep Tissue Injury - Purple or maroon localized area of discolored intact skin or blood-filled blister due to damage of underlying soft tissue from pressure and/or shear.  Wound Description (Comments):   Present on Admission: No    10. Goals of care, severely guarded prognosis.  Patient family currently elected to continue full scope of medical treatment, but they seem hesitant to consider LTAC, recommending for local care discussion palliative care, DNR.     Family Communication  : Home  Code Status : DNR  Disposition Plan  : Guarded  prognosis, continue medical management, goals of  care discussion with palliative care, monitoring chest tube  Consults  : ID, nephrology, GI, PCCM  Procedures  : Tunneled HD cath, 11/10 Tracheostomy 10/30 Right-sided chest tube, 10/14 Right pigtail chest tube 10/13-2/14 Right IJ 10/2-10/10   DVT Prophylaxis  : SCDs  Lab Results  Component Value Date   PLT 304 09/03/2019    Diet :  Diet Order            Diet NPO time specified  Diet effective midnight               Inpatient Medications Scheduled Meds:  aspirin  325 mg Per Tube Daily   atorvastatin  40 mg Oral q1800   B-complex with vitamin C  1 tablet Per Tube Daily   chlorhexidine  15 mL Mouth/Throat BID   Chlorhexidine Gluconate Cloth  6 each Topical Daily   Chlorhexidine Gluconate Cloth  6 each Topical Q0600   [START ON 09/04/2019] Chlorhexidine Gluconate Cloth  6 each Topical Q0600   darbepoetin (ARANESP) injection - NON-DIALYSIS  100 mcg Subcutaneous Q Wed-1800   feeding supplement (PRO-STAT SUGAR FREE 64)  30 mL Per Tube QID   Gerhardt's butt cream   Topical TID   heparin injection (subcutaneous)  5,000 Units Subcutaneous Q8H   mouth rinse  15 mL Mouth Rinse 10 times per day   midodrine  10 mg Per Tube TID WC   [START ON 09/04/2019] pantoprazole sodium  40 mg Per Tube Daily   sodium zirconium cyclosilicate  10 g Oral Daily   sucralfate  1 g Oral Q6H   Continuous Infusions:  sodium chloride Stopped (09/01/19 0000)    ceFAZolin (ANCEF) IV 1 g (09/03/19 2301)   dextrose Stopped (08/16/19 1623)   feeding supplement (NEPRO CARB STEADY) 1,000 mL (09/03/19 1800)   phenylephrine (NEO-SYNEPHRINE) Adult infusion Stopped (09/02/19 1802)   PRN Meds:.sodium chloride, acetaminophen (TYLENOL) oral liquid 160 mg/5 mL, dextrose, heparin, heparin, heparin, metoprolol tartrate, pneumococcal 23 valent vaccine, polyethylene glycol  Antibiotics  :   Anti-infectives (From admission, onward)   Start     Dose/Rate Route Frequency Ordered Stop   08/26/19  2200  ceFAZolin (ANCEF) IVPB 1 g/50 mL premix     1 g 100 mL/hr over 30 Minutes Intravenous Every 24 hours 08/24/19 1021 09/15/19 2359   08/24/19 1100  meropenem (MERREM) 1 g in sodium chloride 0.9 % 100 mL IVPB     1 g 200 mL/hr over 30 Minutes Intravenous Every 24 hours 08/24/19 1021 08/25/19 1156   08/23/19 1100  ceFAZolin (ANCEF) IVPB 2g/100 mL premix  Status:  Discontinued     2 g 200 mL/hr over 30 Minutes Intravenous Every 12 hours 08/23/19 1048 08/24/19 1021   08/21/19 1700  vancomycin (VANCOCIN) IVPB 750 mg/150 ml premix  Status:  Discontinued     750 mg 150 mL/hr over 60 Minutes Intravenous Every 24 hours 08/21/19 0843 08/23/19 1048   08/21/19 1400  vancomycin (VANCOCIN) IVPB 750 mg/150 ml premix  Status:  Discontinued     750 mg 150 mL/hr over 60 Minutes Intravenous Every 24 hours 08/20/19 1146 08/20/19 1218   08/21/19 1000  meropenem (MERREM) 1 g in sodium chloride 0.9 % 100 mL IVPB  Status:  Discontinued     1 g 200 mL/hr over 30 Minutes Intravenous Every 12 hours 08/20/19 1535 08/23/19 1048   08/21/19 0413  meropenem (MERREM) 500 mg in sodium chloride 0.9 % 100 mL IVPB  Status:  Discontinued     500 mg 200 mL/hr over 30 Minutes Intravenous Every 24 hours 08/20/19 1245 08/20/19 1535   08/20/19 1330  vancomycin (VANCOCIN) 1,750 mg in sodium chloride 0.9 % 500 mL IVPB     1,750 mg 250 mL/hr over 120 Minutes Intravenous  Once 08/20/19 1222 08/20/19 1619   08/20/19 1300  vancomycin (VANCOCIN) 1,250 mg in sodium chloride 0.9 % 250 mL IVPB  Status:  Discontinued     1,250 mg 166.7 mL/hr over 90 Minutes Intravenous  Once 08/20/19 1146 08/20/19 1218   08/20/19 1224  vancomycin variable dose per unstable renal function (pharmacist dosing)  Status:  Discontinued      Does not apply See admin instructions 08/20/19 1225 08/21/19 1152   08/15/19 1600  meropenem (MERREM) 1 g in sodium chloride 0.9 % 100 mL IVPB  Status:  Discontinued     1 g 200 mL/hr over 30 Minutes Intravenous Every 12  hours 08/15/19 1543 08/20/19 1245   08/15/19 1000  voriconazole (VFEND) tablet 200 mg  Status:  Discontinued     200 mg Per Tube Every 12 hours 08/14/19 1148 08/15/19 1134   08/14/19 1600  anidulafungin (ERAXIS) 100 mg in sodium chloride 0.9 % 100 mL IVPB  Status:  Discontinued     100 mg 78 mL/hr over 100 Minutes Intravenous Every 24 hours 08/13/19 1645 08/14/19 1148   08/14/19 1300  voriconazole (VFEND) tablet 400 mg     400 mg Per Tube Every 12 hours 08/14/19 1148 08/14/19 2217   08/13/19 1700  anidulafungin (ERAXIS) 200 mg in sodium chloride 0.9 % 200 mL IVPB     200 mg 78 mL/hr over 200 Minutes Intravenous  Once 08/13/19 1645 08/13/19 2030   08/13/19 0200  ceFAZolin (ANCEF) IVPB 2g/100 mL premix  Status:  Discontinued     2 g 200 mL/hr over 30 Minutes Intravenous Every 12 hours 08/11/19 1512 08/15/19 1543   08/12/19 0200  ceFEPIme (MAXIPIME) 2 g in sodium chloride 0.9 % 100 mL IVPB     2 g 200 mL/hr over 30 Minutes Intravenous Every 12 hours 08/11/19 1509 08/12/19 1341   08/07/19 2200  ceFEPIme (MAXIPIME) 2 g in sodium chloride 0.9 % 100 mL IVPB  Status:  Discontinued     2 g 200 mL/hr over 30 Minutes Intravenous Every 12 hours 08/07/19 1540 08/11/19 1509   08/07/19 1000  anidulafungin (ERAXIS) 100 mg in sodium chloride 0.9 % 100 mL IVPB  Status:  Discontinued     100 mg 78 mL/hr over 100 Minutes Intravenous Every 24 hours 08/06/19 0934 08/08/19 1203   08/07/19 0800  ceFEPIme (MAXIPIME) 2 g in sodium chloride 0.9 % 100 mL IVPB  Status:  Discontinued     2 g 200 mL/hr over 30 Minutes Intravenous Every 24 hours 08/06/19 1036 08/07/19 1540   08/06/19 1000  anidulafungin (ERAXIS) 200 mg in sodium chloride 0.9 % 200 mL IVPB    Note to Pharmacy: please   200 mg 78 mL/hr over 200 Minutes Intravenous Every 24 hours 08/06/19 0925 08/06/19 1600   08/06/19 0800  ceFEPIme (MAXIPIME) 2 g in sodium chloride 0.9 % 100 mL IVPB  Status:  Discontinued     2 g 200 mL/hr over 30 Minutes Intravenous  Every 12 hours 08/06/19 0733 08/06/19 1036   08/02/19 2200  ceFAZolin (ANCEF) IVPB 2g/100 mL premix  Status:  Discontinued     2 g 200 mL/hr over 30 Minutes Intravenous Every 8  hours 08/02/19 1338 08/06/19 0733   08/02/19 1400  vancomycin (VANCOCIN) 1,250 mg in sodium chloride 0.9 % 250 mL IVPB  Status:  Discontinued     1,250 mg 166.7 mL/hr over 90 Minutes Intravenous Every 24 hours 08/01/19 1320 08/02/19 1332   08/01/19 1330  vancomycin (VANCOCIN) 2,000 mg in sodium chloride 0.9 % 500 mL IVPB     2,000 mg 250 mL/hr over 120 Minutes Intravenous  Once 08/01/19 1158 08/01/19 1636   08/01/19 1300  ceFEPIme (MAXIPIME) 2 g in sodium chloride 0.9 % 100 mL IVPB  Status:  Discontinued     2 g 200 mL/hr over 30 Minutes Intravenous Every 12 hours 08/01/19 1158 08/02/19 1332   07/26/19 1600  remdesivir 100 mg in sodium chloride 0.9 % 250 mL IVPB     100 mg 500 mL/hr over 30 Minutes Intravenous Every 24 hours 07/26/19 0132 07/29/19 1823   07/26/19 0215  remdesivir 200 mg in sodium chloride 0.9 % 250 mL IVPB     200 mg 500 mL/hr over 30 Minutes Intravenous Once 07/26/19 0132 07/26/19 0520       Objective   Vitals:   09/03/19 1700 09/03/19 1800 09/03/19 1900 09/03/19 1959  BP: (!) 143/76 (!) 149/79 (!) 154/77 (!) 152/74  Pulse: 87 91 83 (!) 101  Resp: (!) 23 (!) 22 20 (!) 25  Temp:      TempSrc:      SpO2: 99% 99% 100% 99%  Weight:      Height:        SpO2: 99 % O2 Flow Rate (L/min): 5 L/min FiO2 (%): 28 %  Wt Readings from Last 3 Encounters:  09/02/19 84 kg  11/08/17 96.2 kg  09/04/16 94.3 kg     Intake/Output Summary (Last 24 hours) at 09/03/2019 2311 Last data filed at 09/03/2019 1900 Gross per 24 hour  Intake 980 ml  Output 50 ml  Net 930 ml    Physical Exam:  Awake, opens eyes to voice, nods head to all questions Trach collar in place, 5 L, normal oxygen saturation with no respiratory distress Irregularly irregular rhythm, normal rate Rectal tube in place Boots  in place on lower extremities No new F.N deficits,     I have personally reviewed the following:   Data Reviewed:  CBC Recent Labs  Lab 08/28/19 0409 08/29/19 0528 08/30/19 0553  09/01/19 1600 09/01/19 2227 09/02/19 0307 09/02/19 0420 09/03/19 0440  WBC 7.7 8.7 9.0   < > 8.4 8.6 8.8 9.4 8.7  HGB 7.9* 7.4* 7.2*   < > 7.4* 7.6* 7.4* 7.7* 7.6*  HCT 25.4* 24.2* 23.3*   < > 23.3* 23.5* 23.4* 24.3* 24.2*  PLT 310 312 361   < > 341 347 367 365 304  MCV 93.4 96.0 95.5   < > 92.5 90.7 92.1 91.7 91.3  MCH 29.0 29.4 29.5   < > 29.4 29.3 29.1 29.1 28.7  MCHC 31.1 30.6 30.9   < > 31.8 32.3 31.6 31.7 31.4  RDW 16.7* 17.1* 16.5*   < > 17.1* 17.1* 17.2* 17.1* 16.2*  LYMPHSABS 0.9 0.9 0.8  --   --   --   --   --   --   MONOABS 0.4 0.6 0.6  --   --   --   --   --   --   EOSABS 0.4 0.4 0.5  --   --   --   --   --   --  BASOSABS 0.0 0.0 0.0  --   --   --   --   --   --    < > = values in this interval not displayed.    Chemistries  Recent Labs  Lab 08/30/19 0553 08/31/19 0800 09/01/19 0356 09/02/19 0420 09/03/19 0440  NA 136 135 134* 136 135  K 5.7* 5.3* 5.0 5.9* 4.2  CL 98 99 98 100 98  CO2 21* 20* 20* 19* 23  GLUCOSE 121* 125* 118* 117* 118*  BUN 136* 118* 145* 183* 75*  CREATININE 6.09* 5.24* 6.49* 7.44* 4.26*  CALCIUM 8.8* 8.6* 8.6* 8.9 8.3*  MG 2.9*  --   --   --   --    ------------------------------------------------------------------------------------------------------------------ No results for input(s): CHOL, HDL, LDLCALC, TRIG, CHOLHDL, LDLDIRECT in the last 72 hours.  Lab Results  Component Value Date   HGBA1C 6.1 (H) 07/26/2019   ------------------------------------------------------------------------------------------------------------------ No results for input(s): TSH, T4TOTAL, T3FREE, THYROIDAB in the last 72 hours.  Invalid input(s):  FREET3 ------------------------------------------------------------------------------------------------------------------ No results for input(s): VITAMINB12, FOLATE, FERRITIN, TIBC, IRON, RETICCTPCT in the last 72 hours.  Coagulation profile Recent Labs  Lab 09/02/19 0649  INR 1.2    No results for input(s): DDIMER in the last 72 hours.  Cardiac Enzymes No results for input(s): CKMB, TROPONINI, MYOGLOBIN in the last 168 hours.  Invalid input(s): CK ------------------------------------------------------------------------------------------------------------------ No results found for: BNP  Micro Results Recent Results (from the past 240 hour(s))  SARS CORONAVIRUS 2 (TAT 6-24 HRS) Nasopharyngeal Nasopharyngeal Swab     Status: None   Collection Time: 08/25/19 11:37 AM   Specimen: Nasopharyngeal Swab  Result Value Ref Range Status   SARS Coronavirus 2 NEGATIVE NEGATIVE Final    Comment: (NOTE) SARS-CoV-2 target nucleic acids are NOT DETECTED. The SARS-CoV-2 RNA is generally detectable in upper and lower respiratory specimens during the acute phase of infection. Negative results do not preclude SARS-CoV-2 infection, do not rule out co-infections with other pathogens, and should not be used as the sole basis for treatment or other patient management decisions. Negative results must be combined with clinical observations, patient history, and epidemiological information. The expected result is Negative. Fact Sheet for Patients: SugarRoll.be Fact Sheet for Healthcare Providers: https://www.woods-mathews.com/ This test is not yet approved or cleared by the [place] FDA and  has been authorized for detection and/or diagnosis of SARS-CoV-2 by FDA under an Emergency Use Authorization (EUA). This EUA will remain  in effect (meaning this test can be used) for the duration of the COVID-19 declaration under Section 56 4(b)(1) of the Act, 21  U.S.C. section 360bbb-3(b)(1), unless the authorization is terminated or revoked sooner. Performed at [facility], [place] [address], [place], [place] [address]     Radiology Reports Dg Abd 1 View  Result Date: 08/09/2019 CLINICAL DATA:  ARDS from COVID-19. EXAM: ABDOMEN - 1 VIEW COMPARISON:  August 06, 2019 FINDINGS: No free air, portal venous gas, or pneumatosis identified on supine imaging. An NG tube terminates in the stomach. Foreign body projects over the midline of the lower pelvis. No other abnormalities. IMPRESSION: 1. The foreign body projecting over the pelvis may be on or in the patient. Recommend clinical correlation. 2. No other acute abnormalities. Electronically Signed   By: Dorise Bullion III M.D   On: 08/09/2019 18:50   Ct Chest Wo Contrast  Result Date: 09/01/2019 CLINICAL DATA:  Coronavirus infection. Chest tube placement for right pneumothorax. EXAM: CT CHEST WITHOUT CONTRAST TECHNIQUE: Multidetector CT imaging  of the chest was performed following the standard protocol without IV contrast. COMPARISON:  Chest radiography same day. CT chest 08/13/2019 FINDINGS: Cardiovascular: Heart size upper limits of normal. Minimal aortic atherosclerosis. No acute vascular finding visible. Mediastinum/Nodes: No mass or lymphadenopathy. Lungs/Pleura: Tracheostomy remains in place. Widespread patchy pulmonary infiltrates in the left lung persist, improved since late October. Better aeration in the left lower lobe. On the right, chest tube remains in place anteriorly, in direct communication with pleural air. Total amount of pleural air estimated at 5-10%. No evidence that this is increasing. Widespread patchy pulmonary infiltrates as seen previously, with a large air cyst in the right lower lung measuring 9.5 x 7.7 x 7.1 cm. I think this is in the right middle lobe, but find it difficult to state that with certainty. Some chance this could be in the right lower lobe, less likely. In any  case, this is likely subsequent to barotrauma. Upper Abdomen: Negative Musculoskeletal: Negative IMPRESSION: 1. Right chest tube remains in place anteriorly, in direct communication with pleural air. Total amount of pleural air estimated at 5-10%. 2. Widespread patchy pulmonary infiltrates, improved since late October. Better aeration in the left lower lobe. 3. Large air cyst in the right lower lung measuring 9.5 x 7.7 x 7.1 cm. This is presumably subsequent to barotrauma. This is slightly larger than on the study of 08/13/2019. 4. Tracheostomy remains in place. Aortic Atherosclerosis (ICD10-I70.0). Electronically Signed   By: Nelson Chimes M.D.   On: 09/01/2019 15:53   Ct Chest Wo Contrast  Result Date: 08/13/2019 CLINICAL DATA:  COVID-19 positivity, follow-up patchy infiltrates EXAM: CT CHEST WITHOUT CONTRAST TECHNIQUE: Multidetector CT imaging of the chest was performed following the standard protocol without IV contrast. COMPARISON:  07/25/2019 FINDINGS: Cardiovascular: Examination is somewhat limited due to lack of IV contrast. Atherosclerotic calcifications of the thoracic aorta are seen. No cardiac enlargement is noted. No aneurysmal dilatation is seen. Right-sided central line is noted extending into the cavoatrial junction. Left jugular central line is noted as well. Mediastinum/Nodes: Endotracheal tube and gastric catheter are seen. A blocking catheter is noted within the right lower lobe bronchi stable from prior exams. Thoracic inlet appears within normal limits. No sizable mediastinal or hilar adenopathy is noted. The esophagus is within normal limits as visualized. Lungs/Pleura: The lungs demonstrate patchy infiltrates similar to that seen on prior CT although the degree of consolidation has improved in the interval from the prior exam. Persistent right lower lobe and left lower lobe consolidation is noted. Complicated pleural effusion is noted on the right. A considerable amount of air is noted  within the pleural effusion as well as within the pleural space. Chest tube is noted anteriorly. The air is likely related to the recent bronchoscopy and manipulation of the bronchial occlusion tube. Upper Abdomen: Visualized upper abdomen shows gastric catheter within the stomach as well as multiple ligation clips within the distal stomach. Musculoskeletal: No chest wall mass or suspicious bone lesions identified. IMPRESSION: Improved aeration within the lungs when compared with the prior CT examination. There remains consolidation within the lower lobes bilaterally worse on the right than the left. Complicated pleural effusion on the right is noted with chest tube in place. The air within the pleural space is likely related to the recent manipulation of the bronchial occlusion tube. Tubes and lines as described above. Aortic Atherosclerosis (ICD10-I70.0). Electronically Signed   By: Inez Catalina M.D.   On: 08/13/2019 15:32   Mr Brain Wo Contrast  Result  Date: 08/26/2019 CLINICAL DATA:  Altered level of consciousness. History of COVID-19. EXAM: MRI HEAD WITHOUT CONTRAST TECHNIQUE: Multiplanar, multiecho pulse sequences of the brain and surrounding structures were obtained without intravenous contrast. COMPARISON:  Head CT 01/05/2014 FINDINGS: Brain: There is cortical restricted diffusion posteriorly in the right frontal lobe consistent with acute infarct. Additional smaller regions of diffusion abnormality are present in the posterior left frontal lobe, anterior left frontal lobe, and right greater than left medial parietal lobes as well as right cerebellum with normal to slightly reduced ADC compatible with acute to early subacute infarcts. There is mild associated T2 hyperintensity/edema associated with the infarcts. A chronic microhemorrhage is noted in the region of the right external capsule. No mass, midline shift, or extra-axial fluid collection is identified. The ventricles and sulci are within normal  limits for age. The pituitary gland is borderline prominent in height without a discrete sellar mass identified on this nondedicated study. Vascular: Major intracranial vascular flow voids are preserved. Skull and upper cervical spine: Unremarkable bone marrow signal. Sinuses/Orbits: Unremarkable orbits. Large bilateral mastoid effusions. Mild left frontal sinus mucosal thickening. Other: None. IMPRESSION: 1. Small acute to early subacute infarcts involving bilateral cerebral cortex and right cerebellum. 2. Large bilateral mastoid effusions. Electronically Signed   By: Logan Bores M.D.   On: 08/26/2019 19:33   US Renal  Result Date: 08/07/2019 CLINICAL DATA:  Acute renal failure. EXAM: RENAL / URINARY TRACT ULTRASOUND COMPLETE COMPARISON:  None. FINDINGS: Right Kidney: Renal measurements: 10.0 x 5.9 x 4.8 cm = volume: 146 mL . Echogenicity within normal limits. No mass or hydronephrosis visualized. Left Kidney: Renal measurements: 11.2 x 6.8 x 5.3 cm = volume: 215 mL. Echogenicity within normal limits. No mass or hydronephrosis visualized. Bladder: Appears normal for degree of bladder distention. Other: None. IMPRESSION: Normal renal ultrasound. Electronically Signed   By: Marijo Conception M.D.   On: 08/07/2019 09:47   Ir Fluoro Guide Cv Line Left  Result Date: 09/02/2019 CLINICAL DATA:  Renal failure, needs durable venous access for hemodialysis. Currently using temporary dialysis catheter in the left IJ. History of right IJ occlusion. EXAM: TUNNELED HEMODIALYSIS CATHETER PLACEMENT WITH ULTRASOUND AND FLUOROSCOPIC GUIDANCE TECHNIQUE: The procedure, risks, benefits, and alternatives were explained to the family. Questions regarding the procedure were encouraged and answered. The family understands and consents to the procedure. Patient was receiving adequate prophylactic antibiotic coverage already. Patency of the left IJ vein was confirmed with ultrasound with image documentation. An appropriate skin site  was determined. Region was prepped using maximum barrier technique including cap and mask, sterile gown, sterile gloves, large sterile sheet, and Chlorhexidine as cutaneous antisepsis. The region was infiltrated locally with 1% lidocaine. Intravenous Fentanyl 75mg and Versed 1.547mwere administered as conscious sedation during continuous monitoring of the patient's level of consciousness and physiological / cardiorespiratory status by the radiology RN, with a total moderate sedation time of 22 minutes. Under real-time ultrasound guidance, the left IJ vein was accessed with a 21 gauge micropuncture needle; the needle tip within the vein was confirmed with ultrasound image documentation. The 018 wire would not advance centrally. Needle exchanged over the 018 guidewire for transitional dilator, which allowed advancement of a stiff angled glidewire, negotiated to the SVC. The transitional dilator was exchanged for a 5 French angiographic catheter, advanced into the IVC. A Palindrome 23 hemodialysis catheter was tunneled from the left anterior chest wall approach to the left IJ dermatotomy site. The MPA catheter was exchanged over an Amplatz wire  for serial vascular dilators which allow placement of a peel-away sheath, through which the catheter was advanced under intermittent fluoroscopy, positioned with its tips in the proximal right atrium. Spot chest radiograph confirms good catheter position. No pneumothorax. Catheter was flushed and primed per protocol. Catheter secured externally with O Prolene sutures. The left IJ dermatotomy site was closed with Dermabond. COMPLICATIONS: COMPLICATIONS None immediate FLUOROSCOPY TIME:  3 minutes 24 seconds; 12 mGy COMPARISON:  None IMPRESSION: 1. Technically successful placement of tunneled left IJ hemodialysis catheter with ultrasound and fluoroscopic guidance. Ready for routine use. ACCESS: Remains approachable for percutaneous intervention as needed. Electronically Signed    By: Lucrezia Europe M.D.   On: 09/02/2019 11:48   Ir US Guide Vasc Access Left  Result Date: 09/02/2019 CLINICAL DATA:  Renal failure, needs durable venous access for hemodialysis. Currently using temporary dialysis catheter in the left IJ. History of right IJ occlusion. EXAM: TUNNELED HEMODIALYSIS CATHETER PLACEMENT WITH ULTRASOUND AND FLUOROSCOPIC GUIDANCE TECHNIQUE: The procedure, risks, benefits, and alternatives were explained to the family. Questions regarding the procedure were encouraged and answered. The family understands and consents to the procedure. Patient was receiving adequate prophylactic antibiotic coverage already. Patency of the left IJ vein was confirmed with ultrasound with image documentation. An appropriate skin site was determined. Region was prepped using maximum barrier technique including cap and mask, sterile gown, sterile gloves, large sterile sheet, and Chlorhexidine as cutaneous antisepsis. The region was infiltrated locally with 1% lidocaine. Intravenous Fentanyl 99mg and Versed 1.510mwere administered as conscious sedation during continuous monitoring of the patient's level of consciousness and physiological / cardiorespiratory status by the radiology RN, with a total moderate sedation time of 22 minutes. Under real-time ultrasound guidance, the left IJ vein was accessed with a 21 gauge micropuncture needle; the needle tip within the vein was confirmed with ultrasound image documentation. The 018 wire would not advance centrally. Needle exchanged over the 018 guidewire for transitional dilator, which allowed advancement of a stiff angled glidewire, negotiated to the SVC. The transitional dilator was exchanged for a 5 French angiographic catheter, advanced into the IVC. A Palindrome 23 hemodialysis catheter was tunneled from the left anterior chest wall approach to the left IJ dermatotomy site. The MPA catheter was exchanged over an Amplatz wire for serial vascular dilators which  allow placement of a peel-away sheath, through which the catheter was advanced under intermittent fluoroscopy, positioned with its tips in the proximal right atrium. Spot chest radiograph confirms good catheter position. No pneumothorax. Catheter was flushed and primed per protocol. Catheter secured externally with O Prolene sutures. The left IJ dermatotomy site was closed with Dermabond. COMPLICATIONS: COMPLICATIONS None immediate FLUOROSCOPY TIME:  3 minutes 24 seconds; 12 mGy COMPARISON:  None IMPRESSION: 1. Technically successful placement of tunneled left IJ hemodialysis catheter with ultrasound and fluoroscopic guidance. Ready for routine use. ACCESS: Remains approachable for percutaneous intervention as needed. Electronically Signed   By: D Lucrezia Europe.D.   On: 09/02/2019 11:48   Dg Chest Port 1 View  Result Date: 09/03/2019 CLINICAL DATA:  Tracheostomy. EXAM: PORTABLE CHEST 1 VIEW COMPARISON:  September 01, 2019. FINDINGS: Tracheostomy tube is in good position. Interval placement of feeding tube which is seen entering stomach. Interval placement of left internal jugular dialysis catheter with distal tip in expected position of cavoatrial junction. Stable bilateral lung opacities are noted. Right-sided chest tube is noted with minimal right basilar pneumothorax. Bony thorax is unremarkable. IMPRESSION: Tracheostomy tube in good position. Interval placement of feeding  tube and left internal jugular dialysis catheter. Stable right-sided chest tube is noted with minimal right basilar pneumothorax. Stable bilateral lung opacities are noted. Electronically Signed   By: Marijo Conception M.D.   On: 09/03/2019 10:25   Dg Chest Port 1 View  Result Date: 09/01/2019 CLINICAL DATA:  68 year old male with chest tube placement. COVID-19 positive. EXAM: PORTABLE CHEST 1 VIEW COMPARISON:  Chest radiograph dated 08/31/2019 and chest CT dated 08/13/2019 FINDINGS: Support apparatus in stable position. No significant  interval change in the appearance of the lungs and bilateral confluent densities predominantly involving the peripheral/subpleural regions as well as bibasilar lungs since the prior radiograph. Right lung base cystic or bolus area appears similar to prior radiograph. No large or detectable pneumothorax. Faint linear density over the right apex appears similar to prior radiograph, likely artifactual and less likely represents pleural reflection. Stable cardiac silhouette. No acute osseous pathology. IMPRESSION: 1. No significant interval change in the appearance of the lungs compared to the prior radiograph. 2. Support apparatus in stable position. Electronically Signed   By: Anner Crete M.D.   On: 09/01/2019 09:55   Dg Chest Port 1 View  Result Date: 08/31/2019 CLINICAL DATA:  COVID-19 positive.  Tracheostomy tube. EXAM: PORTABLE CHEST 1 VIEW COMPARISON:  08/30/2019 FINDINGS: Tracheostomy tube in adequate position. Enteric tube courses into the stomach and off the film as tip is not visualized. Right medial basilar chest tube unchanged. Left IJ central venous catheter horizontally oriented over the SVC unchanged. Lungs are hypoinflated with persistent tiny right-sided pneumothorax. Stable opacification over the right midlung and right base. Stable hazy opacification over the left base/retrocardiac region. Remainder of the exam is unchanged. IMPRESSION: 1. Stable opacification over the right midlung and bibasilar regions. 2.  Stable tiny right-sided pneumothorax. 3.  Tubes and lines as described. Electronically Signed   By: Marin Olp M.D.   On: 08/31/2019 07:15   Dg Chest Port 1 View  Result Date: 08/30/2019 CLINICAL DATA:  Pneumothorax on right EXAM: PORTABLE CHEST 1 VIEW COMPARISON:  Chest radiograph 08/26/2019, 08/23/2019 FINDINGS: Stable cardiomediastinal contours. Unchanged for apparatus. Persistent small right basilar pneumothorax with chest tube in place. Persistent infiltrates/atelectasis in  the right mid upper lung and left base. No large pleural effusion. No acute finding in the visualized skeleton. IMPRESSION: Unchanged chest radiograph with persistent small right basilar pneumothorax and atelectasis/infiltrates in the right mid to upper lung as well as left base. Electronically Signed   By: Audie Pinto M.D.   On: 08/30/2019 09:42   Dg Chest Port 1 View  Result Date: 08/26/2019 CLINICAL DATA:  Pneumothorax. EXAM: PORTABLE CHEST 1 VIEW COMPARISON:  08/23/2019. FINDINGS: Tracheostomy tube, left IJ line, feeding tube, right chest tube in stable position. Stable right base pneumothorax. Stable atelectatic changes/infiltrate right upper lung and left base. Heart size stable. IMPRESSION: 1. Lines and tubes including right chest tube in stable position. Stable right base pneumothorax. 2. Stable right upper lung and left lower atelectatic changes/infiltrates. Chest is unchanged from prior exam. Electronically Signed   By: Marcello Moores  Register   On: 08/26/2019 10:36   Dg Chest Port 1 View  Result Date: 08/23/2019 CLINICAL DATA:  COVID-19 positive, endotracheal tube in place. EXAM: PORTABLE CHEST 1 VIEW COMPARISON:  Chest x-rays dated 08/22/2019. FINDINGS: Tubes and lines are stable in position, including a RIGHT-sided chest tube. Prominent lucency again noted at the RIGHT lung base, stable in the short-term interval, compatible with the air that was associated with a complicated pleural  effusion as demonstrated on chest CT of 08/13/2019, at that time suspected to represent sequela of manipulation of a bronchial occlusion tube at bronchoscopy. Patchy airspace opacities are again seen bilaterally, stable, presumed pneumonia. No new lung findings. IMPRESSION: 1. Stable chest x-ray. Tubes and lines are stable in position, including the RIGHT-sided chest tube with tip positioned at the medial aspects of the RIGHT lung base 2. Patchy airspace opacities bilaterally, stable, presumed bilateral pneumonia.  3. Stable appearance of the collection of air at the RIGHT lung base, as described above. 4. No new lung findings. Electronically Signed   By: Franki Cabot M.D.   On: 08/23/2019 09:04   Dg Chest Port 1 View  Result Date: 08/22/2019 CLINICAL DATA:  Status post tracheostomy. COVID-19 viral pneumonia. EXAM: PORTABLE CHEST 1 VIEW COMPARISON:  08/22/2019 FINDINGS: A new tracheostomy tube is seen in appropriate position. Feeding tube and bilateral central venous catheters remain in stable position. Tip of the right subclavian central venous catheter seen overlying the inferior right atrium. Heart size is within normal limits allowing for low lung volumes. Right chest tube remains in place. No pneumothorax visualized. Bullous disease again seen in right lung base. Increased atelectasis or airspace disease is seen in the retrocardiac left lung base. Mild bilateral peripheral pulmonary airspace disease shows no significant change. IMPRESSION: New tracheostomy tube in appropriate position. Right subclavian Center venous catheter tip overlies the lower right atrium. Increased atelectasis or airspace disease in retrocardiac left lung base. Electronically Signed   By: Marlaine Hind M.D.   On: 08/22/2019 16:49   Dg Chest Port 1 View  Result Date: 08/22/2019 CLINICAL DATA:  Intubated. EXAM: PORTABLE CHEST 1 VIEW COMPARISON:  08/21/2019 FINDINGS: Endotracheal tube in satisfactory position. Feeding tube extending into the distal stomach with its tip not included. Right subclavian catheter tip in the region of the superior cavoatrial junction or upper right atrium. No significant change in bilateral interstitial and patchy opacities and right basilar bullous changes. No definite pneumothorax. Minimal right pleural fluid. Normal sized heart. Thoracic spine degenerative changes. IMPRESSION: 1. No significant change in bilateral pneumonia and interstitial disease and right basilar bullous changes. 2. Minimal right pleural  fluid. Electronically Signed   By: Claudie Revering M.D.   On: 08/22/2019 07:12   Dg Chest Port 1 View  Result Date: 08/21/2019 CLINICAL DATA:  COVID-19, intubation EXAM: PORTABLE CHEST 1 VIEW COMPARISON:  Portable exam 0535 hours compared to 08/20/2019 FINDINGS: Tip of endotracheal tube projects 3.4 cm above carina. Feeding tube extends into stomach. LEFT jugular dual-lumen central venous catheter tip projects over confluence of the SVC and LEFT brachiocephalic vein. Tip of RIGHT jugular line projects over RIGHT atrium; recommend withdrawal 4 cm to place tip at cavoatrial junction. RIGHT thoracostomy tube unchanged. Stable heart size mediastinal contours. Patchy BILATERAL airspace infiltrates consistent with pneumonia. No pleural effusion. Tiny loculated pneumothorax at lateral RIGHT base. IMPRESSION: Tiny residual RIGHT basilar atelectasis despite thoracostomy tube. Persistent BILATERAL pulmonary infiltrates consistent with pneumonia. Electronically Signed   By: Lavonia Dana M.D.   On: 08/21/2019 08:44   Dg Chest Port 1 View  Result Date: 08/20/2019 CLINICAL DATA:  68 year old male with a history of intubation EXAM: PORTABLE CHEST 1 VIEW COMPARISON:  08/19/2019, 08/18/2019, 08/17/2019 FINDINGS: Cardiomediastinal silhouette unchanged in size and contour. Low lung volumes persist. Unchanged endotracheal tube which terminates 2.4 cm above the carina. Unchanged gastric tube terminating in the stomach. Unchanged left IJ temporary hemodialysis catheter which terminates at the confluence of the left  brachiocephalic vein and SVC. Unchanged right subclavian central venous catheter with the tip appearing to terminate superior right atrium. Patchy opacities of the bilateral lungs, unchanged from the prior. No pneumothorax. Blunting of left costophrenic angle with opacity in the retrocardiac region. IMPRESSION: Similar appearance of the lungs with low lung volumes and bilateral patchy opacities, likely combination of  atelectasis/consolidation, developing chronic changes, and possible small left pleural effusion. Unchanged endotracheal tube, left IJ temporary hemodialysis catheter, right subclavian central venous catheter, and gastric tube. Electronically Signed   By: Corrie Mckusick D.O.   On: 08/20/2019 07:50   Dg Chest Port 1 View  Result Date: 08/19/2019 CLINICAL DATA:  Pneumonia and COVID-19. EXAM: PORTABLE CHEST 1 VIEW COMPARISON:  August 18, 2019. FINDINGS: Stable cardiomediastinal silhouette. Endotracheal and nasogastric tubes are unchanged in position. Left internal jugular and right subclavian catheters are unchanged in position. Stable right-sided chest tube is noted with probable stable loculated right basilar hydropneumothorax. Stable opacities are noted throughout both lungs concerning for pneumonia. Bony thorax is unremarkable. IMPRESSION: Stable support apparatus. Stable right-sided chest tube with probable right basilar hydropneumothorax. Stable bilateral lung opacities. Electronically Signed   By: Marijo Conception M.D.   On: 08/19/2019 07:44   Dg Chest Port 1 View  Result Date: 08/18/2019 CLINICAL DATA:  Follow-up endotracheal tube EXAM: PORTABLE CHEST 1 VIEW COMPARISON:  08/17/2019 FINDINGS: Cardiac shadow is stable. Right subclavian central line is noted in satisfactory position. Endotracheal tube and gastric catheter are again seen and stable. Left jugular dialysis catheter is noted. Patchy opacities are again seen in both lungs. Right chest tube is again noted with loculated hydropneumothorax stable from the prior exam. IMPRESSION: Overall stable appearance of the chest. Electronically Signed   By: Inez Catalina M.D.   On: 08/18/2019 03:09   Dg Chest Port 1 View  Result Date: 08/17/2019 CLINICAL DATA:  Right-sided pneumothorax EXAM: PORTABLE CHEST 1 VIEW COMPARISON:  08/16/2019 FINDINGS: No significant interval change in AP portable examination with right-sided chest tube in position and a small,  persistent, loculated appearing hydropneumothorax about the right lung base. There is redemonstrated, extensive bilateral dense heterogeneous and interstitial airspace opacity, worse on the right. Support apparatus including endotracheal tubes, esophagogastric tube, left neck and right subclavian vascular catheters are unchanged. Cardiomegaly. IMPRESSION: 1. No significant interval change in AP portable examination with right-sided chest tube in position and a small, persistent, loculated appearing hydropneumothorax about the right lung base. 2. There is redemonstrated, extensive bilateral dense heterogeneous and interstitial airspace opacity, worse on the right, consistent with multifocal infection. 3.  Unchanged support apparatus. Electronically Signed   By: Eddie Candle M.D.   On: 08/17/2019 15:54   Dg Chest Port 1 View  Result Date: 08/16/2019 CLINICAL DATA:  69 year old male with positive COVID-19. EXAM: PORTABLE CHEST 1 VIEW COMPARISON:  Earlier radiograph dated 08/16/2019 FINDINGS: Support lines and tubes as seen previously. Diffuse bilateral airspace opacities similar to prior radiograph. Apparent area of lucency at the right costophrenic angle may be artifactual and related to skin fold or represent a small residual right pneumothorax. Stable cardiac silhouette. No acute osseous pathology. IMPRESSION: 1. Artifact versus small residual right pneumothorax at the right costophrenic angle. 2. Diffuse bilateral airspace opacities similar to prior radiograph. 3. Support lines and tubes as seen previously. Electronically Signed   By: Anner Crete M.D.   On: 08/16/2019 21:01   Dg Chest Port 1 View  Result Date: 08/16/2019 CLINICAL DATA:  Hypoxia EXAM: PORTABLE CHEST 1 VIEW COMPARISON:  August 15, 2019 FINDINGS: Endotracheal tube tip is 4.2 cm above the carina. Nasogastric tube tip and side port are below the diaphragm. Right subclavian catheter tip is in the right atrium. Chest tube noted on the  right inferiorly, unchanged in position. Left internal jugular catheter tip is at the junction of the left innominate vein and superior vena cava. There is a catheter with its tip overlying the right lower lobe bronchus medially, stable. No pneumothorax is evident currently. There is persistent airspace consolidation in the right mid and lower lung zones. There is patchy opacity in the left mid and lower lung zones, stable. No new opacity evident. Heart is upper normal in size with pulmonary vascular normal. No adenopathy. No bone lesions. IMPRESSION: Stable tube and catheter positions. No pneumothorax evident currently. Airspace opacity bilaterally with areas of consolidation on the right, stable. No new opacity evident. Stable cardiac silhouette. Electronically Signed   By: Lowella Grip III M.D.   On: 08/16/2019 09:06   Dg Chest Port 1 View  Result Date: 08/15/2019 CLINICAL DATA:  Orogastric tube placement. EXAM: PORTABLE CHEST 1 VIEW COMPARISON:  Same day. FINDINGS: Stable cardiomediastinal silhouette. Distal tip of enteric tube is seen in proximal stomach. Endotracheal tube is unchanged. Left internal jugular catheter is unchanged. Right subclavian catheter is unchanged. Stable position of right-sided basilar chest tube is noted without pneumothorax. Stable bilateral lung opacities are noted. Stable catheter seen entering right mainstem bronchus compared to prior exam. Bony thorax is unremarkable. IMPRESSION: Feeding tube has been removed. There has been interval placement of what appears to be nasogastric tube with tip in expected position of proximal stomach. Stable bilateral lung opacities and other support apparatus are noted. Electronically Signed   By: Marijo Conception M.D.   On: 08/15/2019 14:55   Dg Chest Port 1 View  Result Date: 08/15/2019 CLINICAL DATA:  Chest tube, shortness of breath EXAM: PORTABLE CHEST 1 VIEW COMPARISON:  08/14/2019 FINDINGS: Support devices including right basilar  chest tube remain in place, unchanged. No definite pneumothorax currently. Patchy bilateral airspace opacities are again noted, unchanged. Heart is borderline in size. IMPRESSION: Patchy bilateral airspace disease again noted, unchanged. No definite visible pneumothorax. Electronically Signed   By: Rolm Baptise M.D.   On: 08/15/2019 07:58   Dg Chest Port 1 View  Result Date: 08/14/2019 CLINICAL DATA:  Pneumothorax, chest tube EXAM: PORTABLE CHEST 1 VIEW COMPARISON:  08/14/2019 FINDINGS: Right chest tube and remainder support devices are stable. No pneumothorax. Patchy bilateral airspace opacities, right greater than left again noted, stable. Mild cardiomegaly. No visible effusions. IMPRESSION: No visible pneumothorax.  No change since prior study Electronically Signed   By: Rolm Baptise M.D.   On: 08/14/2019 11:46   Dg Chest Port 1 View  Result Date: 08/14/2019 CLINICAL DATA:  Acute respiratory failure with hypoxemia. EXAM: PORTABLE CHEST 1 VIEW COMPARISON:  August 13, 2019. FINDINGS: Stable cardiomediastinal silhouette. Endotracheal and feeding tubes are unchanged in position. Stable left internal jugular and right subclavian catheters are noted. Stable catheter is seen extending into right mainstem bronchus. Stable bilateral lung opacities are noted. Stable right-sided chest tube without pneumothorax. Small bilateral pleural effusions may be present. Bony thorax is unremarkable. IMPRESSION: Grossly stable support apparatus. Stable bilateral lung opacities as described above. Electronically Signed   By: Marijo Conception M.D.   On: 08/14/2019 07:43   Dg Chest Port 1 View  Result Date: 08/13/2019 CLINICAL DATA:  Endotracheal to EXAM: PORTABLE CHEST 1 VIEW COMPARISON:  [id number] hours FINDINGS: Endotracheal tube and 2 central venous line unchanged. NG tube exchanged for feeding tube. A catheter extends through the lumen of the endotracheal tube into the RIGHT mainstem bronchus. There is hydropneumothorax  at the RIGHT lung base again noted. There is diffuse patchy airspace disease which is slightly worse on the RIGHT and similar on the LEFT. IMPRESSION: 1. Stable support apparatus. 2. No change in subpulmonic RIGHT hydropneumothorax. 3. No change in patchy airspace disease in the RIGHT upper lobe. 4. Slight worsening airspace disease in the LEFT lung. Electronically Signed   By: Suzy Bouchard M.D.   On: 08/13/2019 17:39   Dg Chest Port 1 View  Result Date: 08/13/2019 CLINICAL DATA:  Right pneumothorax EXAM: PORTABLE CHEST 1 VIEW COMPARISON:  08/13/2019 FINDINGS: Support devices including right chest tube remain in place, unchanged. Lucency again noted over the right lung base which could reflect loculated right pneumothorax. Airspace disease throughout the lungs bilaterally, right greater than left with improvement in aeration in the left lung since prior study. Heart is normal size. IMPRESSION: Continued lucency over the right lung base could reflect loculated right pneumothorax. Bilateral airspace disease, right greater than left. Improved airspace disease on the left since prior study. Electronically Signed   By: Rolm Baptise M.D.   On: 08/13/2019 11:34   Dg Chest Port 1 View  Result Date: 08/13/2019 CLINICAL DATA:  Respiratory failure EXAM: PORTABLE CHEST 1 VIEW COMPARISON:  Yesterday FINDINGS: Endotracheal tube tip is at the clavicular heads. Right subclavian central line with tip at the upper cavoatrial junction. Endobronchial blocker on the right in stable position over the right hilum. The orogastric tube reaches the stomach. Right base chest tube which crosses the midline, presumably anterior and accentuated by leftward rotation. Basal pneumothorax on the right is unchanged. Unchanged bilateral airspace disease. IMPRESSION: 1. Unchanged hardware positioning including endobronchial blocker tip overlapping the right hilum. 2. Unchanged bilateral airspace disease with right base lucency more likely  loculated pneumothorax rather than cavitary pneumonia Electronically Signed   By: Monte Fantasia M.D.   On: 08/13/2019 09:04   Dg Chest Port 1 View  Result Date: 08/12/2019 CLINICAL DATA:  Respiratory failure. EXAM: PORTABLE CHEST 1 VIEW COMPARISON:  Chest x-ray 08/10/2019. FINDINGS: Endotracheal tube, dual-lumen left IJ line, right PICC line in stable position. Right lower lobe bronchus balloon occlusion catheter stable position. Right chest tube in stable position. Small right base pneumothorax cannot be excluded on today's exam. Bibasilar atelectasis/infiltrates again noted. No interim change. Tiny bilateral pleural effusions. IMPRESSION: 1. Endotracheal tube, dual-lumen left IJ line, right PICC line stable position. Right lower lobe bronchus balloon occlusion catheter in stable position. Right chest tube in stable position. 2. Small right base pneumothorax cannot be excluded on today's exam. 3. Bibasilar atelectasis/infiltrates again noted without interim change. Critical Value/emergent results were called by telephone at the time of interpretation on 08/12/2019 at 7:31 am to the patient's nurse, who verbally acknowledged these results. Electronically Signed   By: Marcello Moores  Register   On: 08/12/2019 07:33   Dg Chest Port 1 View  Result Date: 08/10/2019 CLINICAL DATA:  COVID positive EXAM: PORTABLE CHEST 1 VIEW COMPARISON:  08/09/2019 FINDINGS: Left lung apex and lateral costophrenic angle are partially visualized. Endotracheal tube terminates 5 cm above the carina. Right chest tube.  No pneumothorax is seen. Left IJ dual lumen catheter terminates in the mid SVC. Right subclavian venous catheter terminates at the cavoatrial junction. Enteric tube terminates in the proximal stomach. Stable selective balloon  occlusion catheter in the right lower lobe bronchus. Multifocal patchy opacities, lower lung predominant, grossly unchanged. No definite pleural effusions. The heart is normal in size. IMPRESSION:  Multifocal patchy opacities, grossly unchanged, compatible with pneumonia in this patient with known COVID. Stable right chest tube.  No pneumothorax is seen. Endotracheal tube terminates 5 cm above the carina. Additional stable support apparatus as above. Electronically Signed   By: Julian Hy M.D.   On: 08/10/2019 16:59   Dg Chest Port 1 View  Result Date: 08/09/2019 CLINICAL DATA:  Right-sided pneumothorax. Acute respiratory failure. COVID-19. EXAM: PORTABLE CHEST 1 VIEW COMPARISON:  08/08/2019; 08/07/2019; chest CT-07/25/2019 FINDINGS: Grossly unchanged cardiac silhouette and mediastinal contours. Stable positioning of support apparatus with endotracheal tube overlying tracheal air column with tip superior to the carina, left jugular approach central venous catheter tips projected over the superior SVC, right subclavian approach central venous catheter tip projects over the superior caval junction, enteric tube tip and side port projected over the gastric antrum and right-sided chest tube and selective balloon occlusion catheter involving the right mainstem bronchus. No pneumothorax. Redemonstrated extensive bilateral slightly nodular airspace opacities. Left basilar/retrocardiac consolidative opacities are unchanged. No new focal airspace opacities. No definite pleural effusion. No acute osseous abnormalities. IMPRESSION: 1.  Stable positioning of support apparatus.  No pneumothorax. 2. No significant change in extensive bilateral airspace opacities compatible with provided history of COVID-19 infection. Electronically Signed   By: Sandi Mariscal M.D.   On: 08/09/2019 05:18   Dg Chest Port 1 View  Result Date: 08/08/2019 CLINICAL DATA:  Pulmonary infiltrates. Acute respiratory failure with hypoxemia. COVID-19. EXAM: PORTABLE CHEST 1 VIEW COMPARISON:  08/07/2019 and 08/06/2019 FINDINGS: Endotracheal tube in good position 4.7 cm above the carina. Uniblocker tube tip is in the right lower lobe. Left  jugular vein catheter tip is in the SVC at the level of the carina. Right subclavian catheter tip is in the right atrium. Right-sided chest tube at the base, unchanged. NG tube tip below the diaphragm. There is improved aeration at the right lung base and right midzone. No change in the infiltrates at the left base and left midzone. Heart size and vascularity are normal. IMPRESSION: 1. Improved aeration at the right lung base. No change in the infiltrates at the left base and left midzone. No residual pneumothorax. 2. Tubes and lines appear essentially unchanged. Electronically Signed   By: Lorriane Shire M.D.   On: 08/08/2019 16:27   Dg Chest Port 1 View  Result Date: 08/07/2019 CLINICAL DATA:  Evaluate endotracheal tube position. COVID-19 positive. EXAM: PORTABLE CHEST 1 VIEW COMPARISON:  08/06/2019 FINDINGS: Endotracheal tube terminates 4.9 cm above carina. Nasogastric tube extends beyond the inferior aspect of the film. A right-sided chest tube remains in place. The right-sided pleural drain has been removed. Left internal jugular line tip at mid SVC. Right subclavian line tip at high right atrium, similar. Mild cardiomegaly. Increased fluid within the right minor fissure. The previously described right apical pneumothorax is either improved or resolved. Possible trace pleural air remaining. Increased right base airspace disease. Relatively similar patchy right upper and left mid to lower lung pulmonary opacities. IMPRESSION: Appropriate position of endotracheal tube. Single right chest tube remaining in place, with improved to resolved right apical pneumothorax. Increased right pleural fluid with worsened right base airspace disease. Otherwise similar multifocal pneumonia. Electronically Signed   By: Abigail Miyamoto M.D.   On: 08/07/2019 20:18   Dg Chest Port 1 View  Result Date: 08/06/2019 CLINICAL  DATA:  Chest tube, follow-up pneumothorax EXAM: PORTABLE CHEST 1 VIEW COMPARISON:  Chest radiograph from  earlier today. FINDINGS: Endotracheal tube tip is 4.2 cm above the carina. Enteric tube enters stomach with the tip not seen on this image. Left internal jugular central venous catheter terminates in the middle third of the SVC. Right subclavian central venous catheter terminates over the right atrium. Stable right pigtail chest tube with tip in the peripheral lower right pleural space. Separate right basilar chest tube. Stable cardiomediastinal silhouette with normal heart size. Small residual right apical pneumothorax, significantly decreased. No left pneumothorax. Midline mediastinum. No pleural effusion. Extensive patchy opacities throughout both lungs appear unchanged. IMPRESSION: 1. Small residual right apical pneumothorax, significantly decreased. 2. Stable extensive patchy opacities throughout both lungs. 3. Well-positioned support structures as described. Electronically Signed   By: Ilona Sorrel M.D.   On: 08/06/2019 15:25   Dg Chest Port 1 View  Addendum Date: 08/06/2019   ADDENDUM REPORT: 08/06/2019 13:46 ADDENDUM: These results were called by telephone at the time of interpretation on 08/06/2019 at 1:43 pm to provider Dr. Roselie Awkward, who verbally acknowledged these results. Electronically Signed   By: Aletta Edouard M.D.   On: 08/06/2019 13:46   Result Date: 08/06/2019 CLINICAL DATA:  Replacement of non tunneled dialysis catheter. Respiratory failure secondary to COVID-19 pneumonia. EXAM: PORTABLE CHEST 1 VIEW COMPARISON:  Film earlier today at 1119 hours FINDINGS: Replaced left jugular non tunneled dialysis catheter now projects inferiorly with the tip in the SVC. Other support apparatus stable with the endotracheal tube tip approximately 5 cm above the carina. Right lateral pneumothorax is slightly larger with stable positioning of a lateral pigtail thoracostomy tube. Bilateral severe pneumonia appears stable since the prior chest x-ray. Lucency abutting the right hemidiaphragm may relate  to a component subpulmonic air. However, free intraperitoneal air under the right hemidiaphragm is not entirely excluded by semi-erect chest x-ray. IMPRESSION: 1. Replaced left jugular non tunneled dialysis catheter tip is in the SVC. 2. Slightly larger right lateral pneumothorax. 3. Cannot exclude free air under the right hemidiaphragm. However, the appearance may relate to relative lucency of the lung at the right lung base and component of basilar pneumothorax as well. Electronically Signed: By: Aletta Edouard M.D. On: 08/06/2019 13:41   Dg Chest Port 1 View  Result Date: 08/06/2019 CLINICAL DATA:  COVID.  Insertion of dialysis catheter EXAM: PORTABLE CHEST 1 VIEW COMPARISON:  None. FINDINGS: Left internal jugular dialysis catheter courses across the midline and likely passes into the right innominate vein. Right central line tip is at the cavoatrial junction. Endotracheal tube and NG tube are unchanged. Cardiomegaly. Patchy bilateral airspace opacities again noted, unchanged. Right chest tube remains in place with right basilar pneumothorax, stable. IMPRESSION: Interval placement of left internal jugular Vas-Cath. The tip likely passes into the right innominate vein. No left pneumothorax. Right chest tube remains in place with small right basilar pneumothorax, stable. Stable patchy bilateral airspace opacities. Electronically Signed   By: Rolm Baptise M.D.   On: 08/06/2019 11:56   Dg Chest Port 1 View  Result Date: 08/06/2019 CLINICAL DATA:  68 year old male central line placement. COVID-19. EXAM: PORTABLE CHEST 1 VIEW COMPARISON:  0007 hours today. FINDINGS: Portable AP semi upright view at 0236 hours. The right IJ central line is no longer identified. A right subclavian approach central line now is in place. The tip projects at the level of the right atrium about 2.5 centimeters below the expected level of the cavoatrial  junction. ETT tip is at the level the clavicles. Stable visible enteric tube.  Stable right chest tube. Small volume right pneumothorax appears stable along with patchy right mid and lower lung opacity and confluent left lung base opacity. The left upper lobe remains clear. Stable cardiac size and mediastinal contours. IMPRESSION: 1. Right subclavian approach central line placed, tip at the level of the right atrium about 2.5 cm below the cavoatrial junction. 2.  Otherwise stable lines and tubes. 3. Stable small volume right pneumothorax, bilateral pneumonia. Electronically Signed   By: Genevie Ann M.D.   On: 08/06/2019 03:49   Dg Chest Port 1 View  Result Date: 08/06/2019 CLINICAL DATA:  PICC line EXAM: PORTABLE CHEST 1 VIEW COMPARISON:  08/05/2019, 08/03/2019, 08/02/2019 FINDINGS: Endotracheal tube tip is about 3.3 cm superior to the carina. Esophageal tube tip below the diaphragm but non included. Right lower chest tube remains in place. Residual small pneumothorax at the right lower lateral chest, CP angle and lung base. No upper extremity venous catheters are seen. There is catheter tubing coiled over the right neck/thoracic inlet. Multifocal bilateral consolidations are grossly unchanged, worst in the left base. Stable cardiomediastinal silhouette. IMPRESSION: 1. No upper extremity venous catheter is seen. Possible catheter tubing looped over the right neck 2. Small right lateral and basilar pneumothorax slightly more apparent compared to most recent prior 3. Stable multifocal airspace consolidations. Electronically Signed   By: Donavan Foil M.D.   On: 08/06/2019 00:38   Dg Chest Port 1 View  Result Date: 08/05/2019 CLINICAL DATA:  Acute respiratory failure EXAM: PORTABLE CHEST 1 VIEW COMPARISON:  08/05/2019 FINDINGS: Endotracheal tube appropriately positioned in the mid trachea approximately 4 cm from the carina. Transesophageal tube tip and side port distal to the GE junction, terminating near the gastric antrum. Pigtail right pleural catheter is in stable position. Additional  support devices and monitoring leads overlie the chest. There is a small residual right apical pneumothorax (see annotated image). There is increasing dense bilateral airspace opacity towards the bases which could reflect a combination of atelectasis and residual consolidated lung. No acute osseous or soft tissue abnormality. IMPRESSION: 1. Stable satisfactory positioning of lines and tubes. 2. Small residual right apical pneumothorax. 3. Increasing dense bilateral airspace disease towards the bases, which could reflect a combination of worsening atelectasis and residual consolidated lung. Electronically Signed   By: Lovena Le M.D.   On: 08/05/2019 23:09   Dg Chest Port 1 View  Result Date: 08/05/2019 CLINICAL DATA:  Chest tube. EXAM: PORTABLE CHEST 1 VIEW COMPARISON:  08/05/2019. FINDINGS: Endotracheal tube and NG tube stable position. Right chest tube in stable position. Right pneumothorax is improved from prior exam. Mild residual pneumothorax noted. Bilateral atelectatic changes again noted. No pleural effusion. Thoracic spine scoliosis and degenerative change IMPRESSION: 1.  Lines and tubes including right chest tube in stable position. 2.  Interim partial resolution of right-sided pneumothorax. Electronically Signed   By: Marcello Moores  Register   On: 08/05/2019 09:42   Dg Chest Port 1 View  Result Date: 08/05/2019 CLINICAL DATA:  Mechanically assisted ventilation. EXAM: PORTABLE CHEST 1 VIEW COMPARISON:  August 03, 2019. FINDINGS: Endotracheal and nasogastric tubes are unchanged in position. Stable cardiomediastinal silhouette. There is now interval development of large right pneumothorax resulting in some mediastinal shift to the left suggesting tension pneumothorax. There appears to be complete atelectasis of the right lung. Stable multifocal opacities are noted in the left lung concerning for pneumonia. Bony thorax is unremarkable. IMPRESSION: Interval  development of large right pneumothorax with  mediastinal shift to the left suggesting tension pneumothorax. Complete atelectasis of the right lung is noted. Critical Value/emergent results were called by telephone at the time of interpretation on 08/05/2019 at 8:25 am to [place] , who verbally acknowledged these results and stated that a chest tube has already been placed. Electronically Signed   By: Marijo Conception M.D.   On: 08/05/2019 08:25   Dg Chest Port 1 View  Result Date: 08/05/2019 CLINICAL DATA:  Chest tube placement EXAM: PORTABLE CHEST 1 VIEW COMPARISON:  Earlier today FINDINGS: New right-sided chest tube with decreased but still sizable right pneumothorax. I spoke with ICU nurse and the tube is functional and is currently on suction. There is still leftward displacement of the mediastinum, although the right diaphragm is now elevated rather than depressed. Normal heart size. Normal positioning of endotracheal orogastric tubes. Bilateral airspace disease in the setting of known pneumonia. There is improving right lung aeration. IMPRESSION: New right chest tube with decreased but still large pneumothorax. There is still leftward displacement the mediastinum but right diaphragm elevation has resolved. Electronically Signed   By: Monte Fantasia M.D.   On: 08/05/2019 07:46   Dg Abd Decub  Result Date: 08/06/2019 CLINICAL DATA:  Rule out pneumoperitoneum EXAM: ABDOMEN - 1 VIEW DECUBITUS COMPARISON:  08/02/2019 abdominal radiographs FINDINGS: No evidence of pneumatosis or pneumoperitoneum. No disproportionately dilated small bowel loops. Moderate gas in the nondependent colon. Enteric tube tip is seen in the proximal stomach. IMPRESSION: 1. No evidence of pneumoperitoneum. 2. Moderate gas in the nondependent colon. Nonobstructive bowel gas pattern. 3. Enteric tube tip in the proximal stomach. Electronically Signed   By: Ilona Sorrel M.D.   On: 08/06/2019 17:16   Dg Abd Portable 1v  Result Date: 08/30/2019 CLINICAL DATA:  NG  tube placement EXAM: PORTABLE ABDOMEN - 1 VIEW COMPARISON:  08/16/2019 FINDINGS: There is a nasogastric tube with the tip projecting over the stomach. There is no bowel dilatation to suggest obstruction. There is no evidence of pneumoperitoneum, portal venous gas or pneumatosis. There are no pathologic calcifications along the expected course of the ureters. The osseous structures are unremarkable. IMPRESSION: Nasogastric tube with the tip projecting over the stomach. Electronically Signed   By: Kathreen Devoid   On: 08/30/2019 15:56   Dg Abd Portable 1v  Result Date: 08/16/2019 CLINICAL DATA:  OG tube placement EXAM: PORTABLE ABDOMEN - 1 VIEW COMPARISON:  08/13/2019 FINDINGS: Nasogastric tube with the tip projecting over the stomach. There is no bowel dilatation to suggest obstruction. There is no evidence of pneumoperitoneum, portal venous gas or pneumatosis. There are no pathologic calcifications along the expected course of the ureters. The osseous structures are unremarkable. IMPRESSION: Nasogastric tube with the tip projecting over the stomach. Electronically Signed   By: Kathreen Devoid   On: 08/16/2019 12:09   Dg Abd Portable 1v  Result Date: 08/13/2019 CLINICAL DATA:  ETT advancement, Feeding tube placement EXAM: PORTABLE ABDOMEN - 1 VIEW COMPARISON:  Radiograph 08/10/2019 FINDINGS: NG tube has been replaced with a feeding tube. The tip of the feeding tube is within the second portion the duodenum. Gas in the stomach. No dilated loops of large or small bowel. Bibasilar effusions and atelectasis. IMPRESSION: 1. Feeding tube with tip in the second portion the duodenum. 2. No evidence of bowel obstruction. 3. Bibasilar effusions and atelectasis. Electronically Signed   By: Suzy Bouchard M.D.   On: 08/13/2019 17:30  Time Spent in minutes  30     Desiree Hane M.D on 09/03/2019 at 11:11 PM  To page go to [email or web address] - password [facility]

## 2019-09-03 NOTE — Progress Notes (Signed)
25 Days Post-Op Procedure(s) (LRB): ESOPHAGOGASTRODUODENOSCOPY (EGD) (N/A) HEMOSTASIS CONTROL HEMOSTASIS CLIP PLACEMENT Subjective: Asleep  Objective: Vital signs in last 24 hours: Temp:  [97.9 F (36.6 C)-100 F (37.8 C)] 99.1 F (37.3 C) (11/11 1544) Pulse Rate:  [87-117] 87 (11/11 1700) Cardiac Rhythm: Normal sinus rhythm (11/11 1600) Resp:  [18-29] 23 (11/11 1700) BP: (112-184)/(68-108) 143/76 (11/11 1700) SpO2:  [90 %-100 %] 99 % (11/11 1700) FiO2 (%):  [28 %] 28 % (11/11 1535)  Hemodynamic parameters for last 24 hours:    Intake/Output from previous day: 11/10 0701 - 11/11 0700 In: 1335.1 [I.V.:295.1; NG/GT:990; IV Piggyback:50] Out: -179 [Stool:150] Intake/Output this shift: Total I/O In: 550 [NG/GT:550] Out: -   no air leak  Lab Results: Recent Labs    09/02/19 0420 09/03/19 0440  WBC 9.4 8.7  HGB 7.7* 7.6*  HCT 24.3* 24.2*  PLT 365 304   BMET:  Recent Labs    09/02/19 0420 09/03/19 0440  NA 136 135  K 5.9* 4.2  CL 100 98  CO2 19* 23  GLUCOSE 117* 118*  BUN 183* 75*  CREATININE 7.44* 4.26*  CALCIUM 8.9 8.3*    PT/INR:  Recent Labs    09/02/19 0649  LABPROT 14.9  INR 1.2   ABG    Component Value Date/Time   PHART 7.401 08/23/2019 0453   HCO3 25.7 08/23/2019 0453   TCO2 27 08/23/2019 0453   ACIDBASEDEF 2.0 08/14/2019 0515   O2SAT 93.0 08/23/2019 0453   CBG (last 3)  Recent Labs    09/03/19 0733 09/03/19 1122 09/03/19 1512  GLUCAP 101* 115* 112*    Assessment/Plan: S/P Procedure(s) (LRB): ESOPHAGOGASTRODUODENOSCOPY (EGD) (N/A) HEMOSTASIS CONTROL HEMOSTASIS CLIP PLACEMENT -Patient is currently asleep and I did not awaken him I observed the pleuravac for several minutes and did not see an air leak Will leave on suction for now and check again in Am If no leak in AM will place to water seal   LOS: 40 days    Melrose Nakayama 09/03/2019

## 2019-09-04 ENCOUNTER — Inpatient Hospital Stay (HOSPITAL_COMMUNITY): Payer: Medicare HMO

## 2019-09-04 DIAGNOSIS — Z515 Encounter for palliative care: Secondary | ICD-10-CM | POA: Diagnosis not present

## 2019-09-04 DIAGNOSIS — Z930 Tracheostomy status: Secondary | ICD-10-CM | POA: Diagnosis not present

## 2019-09-04 DIAGNOSIS — N179 Acute kidney failure, unspecified: Secondary | ICD-10-CM | POA: Diagnosis not present

## 2019-09-04 DIAGNOSIS — G931 Anoxic brain damage, not elsewhere classified: Secondary | ICD-10-CM | POA: Diagnosis not present

## 2019-09-04 DIAGNOSIS — U071 COVID-19: Secondary | ICD-10-CM | POA: Diagnosis not present

## 2019-09-04 DIAGNOSIS — J9601 Acute respiratory failure with hypoxia: Secondary | ICD-10-CM | POA: Diagnosis not present

## 2019-09-04 DIAGNOSIS — J939 Pneumothorax, unspecified: Secondary | ICD-10-CM | POA: Diagnosis not present

## 2019-09-04 DIAGNOSIS — Z7189 Other specified counseling: Secondary | ICD-10-CM | POA: Diagnosis not present

## 2019-09-04 DIAGNOSIS — J9311 Primary spontaneous pneumothorax: Secondary | ICD-10-CM | POA: Diagnosis not present

## 2019-09-04 LAB — CBC
HCT: 23.3 % — ABNORMAL LOW (ref 39.0–52.0)
Hemoglobin: 7.4 g/dL — ABNORMAL LOW (ref 13.0–17.0)
MCH: 28.9 pg (ref 26.0–34.0)
MCHC: 31.8 g/dL (ref 30.0–36.0)
MCV: 91 fL (ref 80.0–100.0)
Platelets: 331 10*3/uL (ref 150–400)
RBC: 2.56 MIL/uL — ABNORMAL LOW (ref 4.22–5.81)
RDW: 15.6 % — ABNORMAL HIGH (ref 11.5–15.5)
WBC: 12.1 10*3/uL — ABNORMAL HIGH (ref 4.0–10.5)
nRBC: 0.3 % — ABNORMAL HIGH (ref 0.0–0.2)

## 2019-09-04 LAB — GLUCOSE, CAPILLARY
Glucose-Capillary: 101 mg/dL — ABNORMAL HIGH (ref 70–99)
Glucose-Capillary: 105 mg/dL — ABNORMAL HIGH (ref 70–99)
Glucose-Capillary: 105 mg/dL — ABNORMAL HIGH (ref 70–99)
Glucose-Capillary: 107 mg/dL — ABNORMAL HIGH (ref 70–99)
Glucose-Capillary: 108 mg/dL — ABNORMAL HIGH (ref 70–99)
Glucose-Capillary: 109 mg/dL — ABNORMAL HIGH (ref 70–99)

## 2019-09-04 LAB — BASIC METABOLIC PANEL
Anion gap: 18 — ABNORMAL HIGH (ref 5–15)
BUN: 112 mg/dL — ABNORMAL HIGH (ref 8–23)
CO2: 23 mmol/L (ref 22–32)
Calcium: 8.7 mg/dL — ABNORMAL LOW (ref 8.9–10.3)
Chloride: 94 mmol/L — ABNORMAL LOW (ref 98–111)
Creatinine, Ser: 5.76 mg/dL — ABNORMAL HIGH (ref 0.61–1.24)
GFR calc Af Amer: 11 mL/min — ABNORMAL LOW (ref >60)
GFR calc non Af Amer: 9 mL/min — ABNORMAL LOW (ref >60)
Glucose, Bld: 103 mg/dL — ABNORMAL HIGH (ref 70–99)
Potassium: 4.5 mmol/L (ref 3.5–5.1)
Sodium: 135 mmol/L (ref 135–145)

## 2019-09-04 MED ORDER — HEPARIN SODIUM (PORCINE) 1000 UNIT/ML DIALYSIS: 20.0000 [IU]/kg | INTRAMUSCULAR | Status: DC | PRN

## 2019-09-04 MED ORDER — HEPARIN SODIUM (PORCINE) 1000 UNIT/ML DIALYSIS: 3000.0000 [IU] | INTRAMUSCULAR | Status: DC | PRN

## 2019-09-04 MED ORDER — HEPARIN SODIUM (PORCINE) 1000 UNIT/ML IJ SOLN
3.8000 mL | Freq: Once | INTRAMUSCULAR | Status: AC
  Administered 2019-09-04: 3800 [IU] via INTRAVENOUS

## 2019-09-04 MED ORDER — HEPARIN SODIUM (PORCINE) 1000 UNIT/ML IJ SOLN
INTRAMUSCULAR | Status: AC
  Filled 2019-09-04: qty 4

## 2019-09-04 MED ORDER — WHITE PETROLATUM EX OINT
TOPICAL_OINTMENT | CUTANEOUS | Status: AC
  Administered 2019-09-04: 1
  Filled 2019-09-04: qty 28.35

## 2019-09-04 NOTE — Progress Notes (Signed)
26 Days Post-Op Procedure(s) (LRB): ESOPHAGOGASTRODUODENOSCOPY (EGD) (N/A) HEMOSTASIS CONTROL HEMOSTASIS CLIP PLACEMENT Subjective: On HD this AM Cough with thick secretions. HR up, sats 89%  Objective: Vital signs in last 24 hours: Temp:  [99.1 F (37.3 C)-99.4 F (37.4 C)] 99.1 F (37.3 C) (11/12 0731) Pulse Rate:  [83-121] 116 (11/12 0745) Cardiac Rhythm: Normal sinus rhythm (11/12 0400) Resp:  [18-30] 23 (11/12 0745) BP: (112-173)/(68-92) 150/84 (11/12 0745) SpO2:  [90 %-100 %] 98 % (11/12 0745) FiO2 (%):  [28 %] 28 % (11/12 0731)  Hemodynamic parameters for last 24 hours:    Intake/Output from previous day: 11/11 0701 - 11/12 0700 In: 1090 [NG/GT:1090] Out: 400 [Stool:300; Chest Tube:100] Intake/Output this shift: No intake/output data recorded.  General appearance: mild distress Lungs: rhonchi bilaterally no air leak  Lab Results: Recent Labs    09/03/19 0440 09/04/19 0730  WBC 8.7 12.1*  HGB 7.6* 7.4*  HCT 24.2* 23.3*  PLT 304 331   BMET:  Recent Labs    09/02/19 0420 09/03/19 0440  NA 136 135  K 5.9* 4.2  CL 100 98  CO2 19* 23  GLUCOSE 117* 118*  BUN 183* 75*  CREATININE 7.44* 4.26*  CALCIUM 8.9 8.3*    PT/INR:  Recent Labs    09/02/19 0649  LABPROT 14.9  INR 1.2   ABG    Component Value Date/Time   PHART 7.401 08/23/2019 0453   HCO3 25.7 08/23/2019 0453   TCO2 27 08/23/2019 0453   ACIDBASEDEF 2.0 08/14/2019 0515   O2SAT 93.0 08/23/2019 0453   CBG (last 3)  Recent Labs    09/03/19 1512 09/04/19 0031 09/04/19 0513  GLUCAP 112* 108* 101*    Assessment/Plan: S/P Procedure(s) (LRB): ESOPHAGOGASTRODUODENOSCOPY (EGD) (N/A) HEMOSTASIS CONTROL HEMOSTASIS CLIP PLACEMENT -Chest tube was kinked this AM, CXR unchanged After unkinking tube there were a few air bubbles that came through the pleuravac, but after a few minutes no additional air leak He can probably go to water seal but won't throw another variable in the mix with  other issues going on currently   LOS: 41 days    [place] Nakayama 09/04/2019

## 2019-09-04 NOTE — Progress Notes (Signed)
[facility]  PROGRESS NOTE  Natalio Salois OEU:[id number] DOB: [birth year] DOA: 07/25/2019 PCP: Kathyrn Drown, MD Admit date - 07/25/2019   Admitting Physician Kristopher Oppenheim, DO  Outpatient Primary MD for the patient is Kathyrn Drown, MD  LOS - 23 Brief Narrative   Dorien Mayotte is a 68 y.o. year old male with medical history significant for GERD who presented on 07/25/2019 as a transfer from [facility], ED with dyspnea found to have multifocal infiltrates on CT chet, hypoxia requiring intubation and history admission to [facility] for Covid pneumonia.    Prolonged Hospital course complicated by [id number] pneumonia, ARDS, AKI requiring CRRT, pneumothorax of bronchopleural fistula requiring Pleur-evac, prolonged mechanical ventilation status post tracheostomy on 10/30, MSSA bacteremia, intermittent pressor support required with dialysis  Subjective  FNAME@ [facility] today had no acute events.   A & P    1. Acute hypoxic respiratory failure secondary to Covid pneumonia.  Completed remdesivir/Decadron therapy.  Covid test negative x2 (11/1, 11/2).  On trach collar per PCCM chronically requiring 5 L maintaining normal oxygen saturation. 2. AKI on CKD Stage 3( baseline cr around 1.5) requiring intermittent HD likely ATN related to septic shock.  CRRT DC'd 11/1, dialysis (started 11/2) per nephrology 3. Septic shock, resolved, persistent hypotension.  Requiring as needed pressors during dialysis, continue midodrine 10 mg 3 times daily 4. Right pneumothorax/empyema/necrotic right lower lobe/bronchopleural fistula.  Chest tube in place, CT surgery following, plan for waterseal if airleak remains resolved, may be candidate for endobronchial valve  5. Acute on chronic anemia, worsened in setting of GI bleed/gastric ulcer status post EGD with culprit vessel and large ulcer.  Hemoglobin currently stable.  Continue PPI twice daily 6. New cerebral/cerebellar strokes likely due to hypotension secondary to dialysis  and sepsis and deconditioning. PAF is also a potential etiology.  Neurology recommends aspirin, permissive hypertension (goal BP 120-140), not for anticoagulation given history of GI bleed 7. Atrial fibrillation, New onset, intermittently tachycardic110-120s to  unable to anticoagulate due to recent GI bleeding, continue aspirin, will add IV PRN BB, avoid long acting BB given sensitive BP. 8. MSSA bacteremia, continue cefazolin, stop date 11/23 (6 total weeks), TTE negative but still high concern for infective endocarditis  9. Pseudomonas and Acinetobacter NP BAL specimen, resolved.  Completed 10-day treatment of meropenem Pressure injuries, not present on admission.  Pressure Injury 08/16/19 Lip Medial;Lower Stage I -  Intact skin with non-blanchable redness of a localized area usually over a bony prominence. healing  (Active)  08/16/19 2000  Location: Lip  Location Orientation: Medial;Lower  Staging: Stage I -  Intact skin with non-blanchable redness of a localized area usually over a bony prominence.  Wound Description (Comments): healing   Present on Admission: No     Pressure Injury 08/24/19 Sacrum Right;Left;Mid Stage II -  Partial thickness loss of dermis presenting as a shallow open ulcer with a red, pink wound bed without slough.  (Active)  08/24/19 0800  Location: Sacrum  Location Orientation: Right;Left;Mid  Staging: Stage II -  Partial thickness loss of dermis presenting as a shallow open ulcer with a red, pink wound bed without slough.  Wound Description (Comments): -- (stage 11 mid sacrum)  Present on Admission: No    10. Goals of care, severely guarded prognosis.  Patient family currently elected to continue full scope of medical treatment, but wife will discuss with family as they are considering comfort care, appreciate palliative care team assistance.     Family Communication  :  Spoke with His wife Mrs. Steenbergen at [phone]  Code Status : DNR  Disposition Plan  :  Guarded prognosis, continue medical management, goals of care discussion,  monitoring chest tube  Consults  : ID, nephrology, GI, PCCM, Palliative  Procedures  : Tunneled HD cath, 11/10 Tracheostomy 10/30 Right-sided chest tube, 10/14 Right pigtail chest tube 10/13-2/14 Right IJ 10/2-10/10   DVT Prophylaxis  : SCDs  Lab Results  Component Value Date   PLT 331 09/04/2019    Diet :  Diet Order            Diet NPO time specified  Diet effective midnight               Inpatient Medications Scheduled Meds:  aspirin  325 mg Per Tube Daily   atorvastatin  40 mg Oral q1800   B-complex with vitamin C  1 tablet Per Tube Daily   chlorhexidine  15 mL Mouth/Throat BID   Chlorhexidine Gluconate Cloth  6 each Topical Daily   Chlorhexidine Gluconate Cloth  6 each Topical Q0600   Chlorhexidine Gluconate Cloth  6 each Topical Q0600   darbepoetin (ARANESP) injection - NON-DIALYSIS  100 mcg Subcutaneous Q Wed-1800   feeding supplement (PRO-STAT SUGAR FREE 64)  30 mL Per Tube QID   Gerhardt's butt cream   Topical TID   heparin injection (subcutaneous)  5,000 Units Subcutaneous Q8H   mouth rinse  15 mL Mouth Rinse 10 times per day   midodrine  10 mg Per Tube TID WC   pantoprazole sodium  40 mg Per Tube Daily   sodium zirconium cyclosilicate  10 g Oral Daily   sucralfate  1 g Oral Q6H   Continuous Infusions:  sodium chloride Stopped (09/01/19 0000)    ceFAZolin (ANCEF) IV 1 g (09/03/19 2301)   dextrose Stopped (08/16/19 1623)   feeding supplement (NEPRO CARB STEADY) 45 mL/hr at 09/04/19 0600   PRN Meds:.sodium chloride, acetaminophen (TYLENOL) oral liquid 160 mg/5 mL, dextrose, heparin, heparin, heparin, heparin, [START ON 09/05/2019] heparin, metoprolol tartrate, pneumococcal 23 valent vaccine, polyethylene glycol  Antibiotics  :   Anti-infectives (From admission, onward)   Start     Dose/Rate Route Frequency Ordered Stop   08/26/19 2200  ceFAZolin (ANCEF) IVPB  1 g/50 mL premix     1 g 100 mL/hr over 30 Minutes Intravenous Every 24 hours 08/24/19 1021 09/15/19 2359   08/24/19 1100  meropenem (MERREM) 1 g in sodium chloride 0.9 % 100 mL IVPB     1 g 200 mL/hr over 30 Minutes Intravenous Every 24 hours 08/24/19 1021 08/25/19 1156   08/23/19 1100  ceFAZolin (ANCEF) IVPB 2g/100 mL premix  Status:  Discontinued     2 g 200 mL/hr over 30 Minutes Intravenous Every 12 hours 08/23/19 1048 08/24/19 1021   08/21/19 1700  vancomycin (VANCOCIN) IVPB 750 mg/150 ml premix  Status:  Discontinued     750 mg 150 mL/hr over 60 Minutes Intravenous Every 24 hours 08/21/19 0843 08/23/19 1048   08/21/19 1400  vancomycin (VANCOCIN) IVPB 750 mg/150 ml premix  Status:  Discontinued     750 mg 150 mL/hr over 60 Minutes Intravenous Every 24 hours 08/20/19 1146 08/20/19 1218   08/21/19 1000  meropenem (MERREM) 1 g in sodium chloride 0.9 % 100 mL IVPB  Status:  Discontinued     1 g 200 mL/hr over 30 Minutes Intravenous Every 12 hours 08/20/19 1535 08/23/19 1048   08/21/19 0413  meropenem (MERREM) 500 mg  in sodium chloride 0.9 % 100 mL IVPB  Status:  Discontinued     500 mg 200 mL/hr over 30 Minutes Intravenous Every 24 hours 08/20/19 1245 08/20/19 1535   08/20/19 1330  vancomycin (VANCOCIN) 1,750 mg in sodium chloride 0.9 % 500 mL IVPB     1,750 mg 250 mL/hr over 120 Minutes Intravenous  Once 08/20/19 1222 08/20/19 1619   08/20/19 1300  vancomycin (VANCOCIN) 1,250 mg in sodium chloride 0.9 % 250 mL IVPB  Status:  Discontinued     1,250 mg 166.7 mL/hr over 90 Minutes Intravenous  Once 08/20/19 1146 08/20/19 1218   08/20/19 1224  vancomycin variable dose per unstable renal function (pharmacist dosing)  Status:  Discontinued      Does not apply See admin instructions 08/20/19 1225 08/21/19 1152   08/15/19 1600  meropenem (MERREM) 1 g in sodium chloride 0.9 % 100 mL IVPB  Status:  Discontinued     1 g 200 mL/hr over 30 Minutes Intravenous Every 12 hours 08/15/19 1543 08/20/19  1245   08/15/19 1000  voriconazole (VFEND) tablet 200 mg  Status:  Discontinued     200 mg Per Tube Every 12 hours 08/14/19 1148 08/15/19 1134   08/14/19 1600  anidulafungin (ERAXIS) 100 mg in sodium chloride 0.9 % 100 mL IVPB  Status:  Discontinued     100 mg 78 mL/hr over 100 Minutes Intravenous Every 24 hours 08/13/19 1645 08/14/19 1148   08/14/19 1300  voriconazole (VFEND) tablet 400 mg     400 mg Per Tube Every 12 hours 08/14/19 1148 08/14/19 2217   08/13/19 1700  anidulafungin (ERAXIS) 200 mg in sodium chloride 0.9 % 200 mL IVPB     200 mg 78 mL/hr over 200 Minutes Intravenous  Once 08/13/19 1645 08/13/19 2030   08/13/19 0200  ceFAZolin (ANCEF) IVPB 2g/100 mL premix  Status:  Discontinued     2 g 200 mL/hr over 30 Minutes Intravenous Every 12 hours 08/11/19 1512 08/15/19 1543   08/12/19 0200  ceFEPIme (MAXIPIME) 2 g in sodium chloride 0.9 % 100 mL IVPB     2 g 200 mL/hr over 30 Minutes Intravenous Every 12 hours 08/11/19 1509 08/12/19 1341   08/07/19 2200  ceFEPIme (MAXIPIME) 2 g in sodium chloride 0.9 % 100 mL IVPB  Status:  Discontinued     2 g 200 mL/hr over 30 Minutes Intravenous Every 12 hours 08/07/19 1540 08/11/19 1509   08/07/19 1000  anidulafungin (ERAXIS) 100 mg in sodium chloride 0.9 % 100 mL IVPB  Status:  Discontinued     100 mg 78 mL/hr over 100 Minutes Intravenous Every 24 hours 08/06/19 0934 08/08/19 1203   08/07/19 0800  ceFEPIme (MAXIPIME) 2 g in sodium chloride 0.9 % 100 mL IVPB  Status:  Discontinued     2 g 200 mL/hr over 30 Minutes Intravenous Every 24 hours 08/06/19 1036 08/07/19 1540   08/06/19 1000  anidulafungin (ERAXIS) 200 mg in sodium chloride 0.9 % 200 mL IVPB    Note to Pharmacy: please   200 mg 78 mL/hr over 200 Minutes Intravenous Every 24 hours 08/06/19 0925 08/06/19 1600   08/06/19 0800  ceFEPIme (MAXIPIME) 2 g in sodium chloride 0.9 % 100 mL IVPB  Status:  Discontinued     2 g 200 mL/hr over 30 Minutes Intravenous Every 12 hours 08/06/19 0733  08/06/19 1036   08/02/19 2200  ceFAZolin (ANCEF) IVPB 2g/100 mL premix  Status:  Discontinued     2  g 200 mL/hr over 30 Minutes Intravenous Every 8 hours 08/02/19 1338 08/06/19 0733   08/02/19 1400  vancomycin (VANCOCIN) 1,250 mg in sodium chloride 0.9 % 250 mL IVPB  Status:  Discontinued     1,250 mg 166.7 mL/hr over 90 Minutes Intravenous Every 24 hours 08/01/19 1320 08/02/19 1332   08/01/19 1330  vancomycin (VANCOCIN) 2,000 mg in sodium chloride 0.9 % 500 mL IVPB     2,000 mg 250 mL/hr over 120 Minutes Intravenous  Once 08/01/19 1158 08/01/19 1636   08/01/19 1300  ceFEPIme (MAXIPIME) 2 g in sodium chloride 0.9 % 100 mL IVPB  Status:  Discontinued     2 g 200 mL/hr over 30 Minutes Intravenous Every 12 hours 08/01/19 1158 08/02/19 1332   07/26/19 1600  remdesivir 100 mg in sodium chloride 0.9 % 250 mL IVPB     100 mg 500 mL/hr over 30 Minutes Intravenous Every 24 hours 07/26/19 0132 07/29/19 1823   07/26/19 0215  remdesivir 200 mg in sodium chloride 0.9 % 250 mL IVPB     200 mg 500 mL/hr over 30 Minutes Intravenous Once 07/26/19 0132 07/26/19 0520       Objective   Vitals:   09/04/19 1300 09/04/19 1400 09/04/19 1500 09/04/19 1532  BP: 134/68 111/62    Pulse: (!) 103 (!) 108 (!) 108   Resp: (!) 22 (!) 25 (!) 25   Temp:    99.5 F (37.5 C)  TempSrc:    Axillary  SpO2: 99% 99% 100%   Weight:      Height:        SpO2: 100 % O2 Flow Rate (L/min): 5 L/min FiO2 (%): 28 %  Wt Readings from Last 3 Encounters:  09/02/19 84 kg  11/08/17 96.2 kg  09/04/16 94.3 kg     Intake/Output Summary (Last 24 hours) at 09/04/2019 1602 Last data filed at 09/04/2019 1500 Gross per 24 hour  Intake 1090 ml  Output 475 ml  Net 615 ml    Physical Exam:  Awake, opens eyes to voice, nods head to all questions Trach collar in place, 5 L, normal oxygen saturation with no respiratory distress Irregularly irregular rhythm, normal rate Rectal tube in place Boots in place on lower  extremities No new F.N deficits,     I have personally reviewed the following:   Data Reviewed:  CBC Recent Labs  Lab 08/29/19 0528 08/30/19 0553  09/01/19 2227 09/02/19 0307 09/02/19 0420 09/03/19 0440 09/04/19 0730  WBC 8.7 9.0   < > 8.6 8.8 9.4 8.7 12.1*  HGB 7.4* 7.2*   < > 7.6* 7.4* 7.7* 7.6* 7.4*  HCT 24.2* 23.3*   < > 23.5* 23.4* 24.3* 24.2* 23.3*  PLT 312 361   < > 347 367 365 304 331  MCV 96.0 95.5   < > 90.7 92.1 91.7 91.3 91.0  MCH 29.4 29.5   < > 29.3 29.1 29.1 28.7 28.9  MCHC 30.6 30.9   < > 32.3 31.6 31.7 31.4 31.8  RDW 17.1* 16.5*   < > 17.1* 17.2* 17.1* 16.2* 15.6*  LYMPHSABS 0.9 0.8  --   --   --   --   --   --   MONOABS 0.6 0.6  --   --   --   --   --   --   EOSABS 0.4 0.5  --   --   --   --   --   --   BASOSABS 0.0  0.0  --   --   --   --   --   --    < > = values in this interval not displayed.    Chemistries  Recent Labs  Lab 08/30/19 0553 08/31/19 0800 09/01/19 0356 09/02/19 0420 09/03/19 0440 09/04/19 0730  NA 136 135 134* 136 135 135  K 5.7* 5.3* 5.0 5.9* 4.2 4.5  CL 98 99 98 100 98 94*  CO2 21* 20* 20* 19* 23 23  GLUCOSE 121* 125* 118* 117* 118* 103*  BUN 136* 118* 145* 183* 75* 112*  CREATININE 6.09* 5.24* 6.49* 7.44* 4.26* 5.76*  CALCIUM 8.8* 8.6* 8.6* 8.9 8.3* 8.7*  MG 2.9*  --   --   --   --   --    ------------------------------------------------------------------------------------------------------------------ No results for input(s): CHOL, HDL, LDLCALC, TRIG, CHOLHDL, LDLDIRECT in the last 72 hours.  Lab Results  Component Value Date   HGBA1C 6.1 (H) 07/26/2019   ------------------------------------------------------------------------------------------------------------------ No results for input(s): TSH, T4TOTAL, T3FREE, THYROIDAB in the last 72 hours.  Invalid input(s): FREET3 ------------------------------------------------------------------------------------------------------------------ No results for input(s):  VITAMINB12, FOLATE, FERRITIN, TIBC, IRON, RETICCTPCT in the last 72 hours.  Coagulation profile Recent Labs  Lab 09/02/19 0649  INR 1.2    No results for input(s): DDIMER in the last 72 hours.  Cardiac Enzymes No results for input(s): CKMB, TROPONINI, MYOGLOBIN in the last 168 hours.  Invalid input(s): CK ------------------------------------------------------------------------------------------------------------------ No results found for: BNP  Micro Results No results found for this or any previous visit (from the past 240 hour(s)).  Radiology Reports Dg Abd 1 View  Result Date: 08/09/2019 CLINICAL DATA:  ARDS from COVID-19. EXAM: ABDOMEN - 1 VIEW COMPARISON:  August 06, 2019 FINDINGS: No free air, portal venous gas, or pneumatosis identified on supine imaging. An NG tube terminates in the stomach. Foreign body projects over the midline of the lower pelvis. No other abnormalities. IMPRESSION: 1. The foreign body projecting over the pelvis may be on or in the patient. Recommend clinical correlation. 2. No other acute abnormalities. Electronically Signed   By: Dorise Bullion III M.D   On: 08/09/2019 18:50   Ct Chest Wo Contrast  Result Date: 09/01/2019 CLINICAL DATA:  Coronavirus infection. Chest tube placement for right pneumothorax. EXAM: CT CHEST WITHOUT CONTRAST TECHNIQUE: Multidetector CT imaging of the chest was performed following the standard protocol without IV contrast. COMPARISON:  Chest radiography same day. CT chest 08/13/2019 FINDINGS: Cardiovascular: Heart size upper limits of normal. Minimal aortic atherosclerosis. No acute vascular finding visible. Mediastinum/Nodes: No mass or lymphadenopathy. Lungs/Pleura: Tracheostomy remains in place. Widespread patchy pulmonary infiltrates in the left lung persist, improved since late October. Better aeration in the left lower lobe. On the right, chest tube remains in place anteriorly, in direct communication with pleural air.  Total amount of pleural air estimated at 5-10%. No evidence that this is increasing. Widespread patchy pulmonary infiltrates as seen previously, with a large air cyst in the right lower lung measuring 9.5 x 7.7 x 7.1 cm. I think this is in the right middle lobe, but find it difficult to state that with certainty. Some chance this could be in the right lower lobe, less likely. In any case, this is likely subsequent to barotrauma. Upper Abdomen: Negative Musculoskeletal: Negative IMPRESSION: 1. Right chest tube remains in place anteriorly, in direct communication with pleural air. Total amount of pleural air estimated at 5-10%. 2. Widespread patchy pulmonary infiltrates, improved since late October. Better aeration in the left lower  lobe. 3. Large air cyst in the right lower lung measuring 9.5 x 7.7 x 7.1 cm. This is presumably subsequent to barotrauma. This is slightly larger than on the study of 08/13/2019. 4. Tracheostomy remains in place. Aortic Atherosclerosis (ICD10-I70.0). Electronically Signed   By: Nelson Chimes M.D.   On: 09/01/2019 15:53   Ct Chest Wo Contrast  Result Date: 08/13/2019 CLINICAL DATA:  COVID-19 positivity, follow-up patchy infiltrates EXAM: CT CHEST WITHOUT CONTRAST TECHNIQUE: Multidetector CT imaging of the chest was performed following the standard protocol without IV contrast. COMPARISON:  07/25/2019 FINDINGS: Cardiovascular: Examination is somewhat limited due to lack of IV contrast. Atherosclerotic calcifications of the thoracic aorta are seen. No cardiac enlargement is noted. No aneurysmal dilatation is seen. Right-sided central line is noted extending into the cavoatrial junction. Left jugular central line is noted as well. Mediastinum/Nodes: Endotracheal tube and gastric catheter are seen. A blocking catheter is noted within the right lower lobe bronchi stable from prior exams. Thoracic inlet appears within normal limits. No sizable mediastinal or hilar adenopathy is noted. The  esophagus is within normal limits as visualized. Lungs/Pleura: The lungs demonstrate patchy infiltrates similar to that seen on prior CT although the degree of consolidation has improved in the interval from the prior exam. Persistent right lower lobe and left lower lobe consolidation is noted. Complicated pleural effusion is noted on the right. A considerable amount of air is noted within the pleural effusion as well as within the pleural space. Chest tube is noted anteriorly. The air is likely related to the recent bronchoscopy and manipulation of the bronchial occlusion tube. Upper Abdomen: Visualized upper abdomen shows gastric catheter within the stomach as well as multiple ligation clips within the distal stomach. Musculoskeletal: No chest wall mass or suspicious bone lesions identified. IMPRESSION: Improved aeration within the lungs when compared with the prior CT examination. There remains consolidation within the lower lobes bilaterally worse on the right than the left. Complicated pleural effusion on the right is noted with chest tube in place. The air within the pleural space is likely related to the recent manipulation of the bronchial occlusion tube. Tubes and lines as described above. Aortic Atherosclerosis (ICD10-I70.0). Electronically Signed   By: Inez Catalina M.D.   On: 08/13/2019 15:32   Mr Brain Wo Contrast  Result Date: 08/26/2019 CLINICAL DATA:  Altered level of consciousness. History of COVID-19. EXAM: MRI HEAD WITHOUT CONTRAST TECHNIQUE: Multiplanar, multiecho pulse sequences of the brain and surrounding structures were obtained without intravenous contrast. COMPARISON:  Head CT 01/05/2014 FINDINGS: Brain: There is cortical restricted diffusion posteriorly in the right frontal lobe consistent with acute infarct. Additional smaller regions of diffusion abnormality are present in the posterior left frontal lobe, anterior left frontal lobe, and right greater than left medial parietal lobes as  well as right cerebellum with normal to slightly reduced ADC compatible with acute to early subacute infarcts. There is mild associated T2 hyperintensity/edema associated with the infarcts. A chronic microhemorrhage is noted in the region of the right external capsule. No mass, midline shift, or extra-axial fluid collection is identified. The ventricles and sulci are within normal limits for age. The pituitary gland is borderline prominent in height without a discrete sellar mass identified on this nondedicated study. Vascular: Major intracranial vascular flow voids are preserved. Skull and upper cervical spine: Unremarkable bone marrow signal. Sinuses/Orbits: Unremarkable orbits. Large bilateral mastoid effusions. Mild left frontal sinus mucosal thickening. Other: None. IMPRESSION: 1. Small acute to early subacute infarcts involving bilateral  cerebral cortex and right cerebellum. 2. Large bilateral mastoid effusions. Electronically Signed   By: Logan Bores M.D.   On: 08/26/2019 19:33   US Renal  Result Date: 08/07/2019 CLINICAL DATA:  Acute renal failure. EXAM: RENAL / URINARY TRACT ULTRASOUND COMPLETE COMPARISON:  None. FINDINGS: Right Kidney: Renal measurements: 10.0 x 5.9 x 4.8 cm = volume: 146 mL . Echogenicity within normal limits. No mass or hydronephrosis visualized. Left Kidney: Renal measurements: 11.2 x 6.8 x 5.3 cm = volume: 215 mL. Echogenicity within normal limits. No mass or hydronephrosis visualized. Bladder: Appears normal for degree of bladder distention. Other: None. IMPRESSION: Normal renal ultrasound. Electronically Signed   By: Marijo Conception M.D.   On: 08/07/2019 09:47   Ir Fluoro Guide Cv Line Left  Result Date: 09/02/2019 CLINICAL DATA:  Renal failure, needs durable venous access for hemodialysis. Currently using temporary dialysis catheter in the left IJ. History of right IJ occlusion. EXAM: TUNNELED HEMODIALYSIS CATHETER PLACEMENT WITH ULTRASOUND AND FLUOROSCOPIC GUIDANCE  TECHNIQUE: The procedure, risks, benefits, and alternatives were explained to the family. Questions regarding the procedure were encouraged and answered. The family understands and consents to the procedure. Patient was receiving adequate prophylactic antibiotic coverage already. Patency of the left IJ vein was confirmed with ultrasound with image documentation. An appropriate skin site was determined. Region was prepped using maximum barrier technique including cap and mask, sterile gown, sterile gloves, large sterile sheet, and Chlorhexidine as cutaneous antisepsis. The region was infiltrated locally with 1% lidocaine. Intravenous Fentanyl 60mg and Versed 1.542mwere administered as conscious sedation during continuous monitoring of the patient's level of consciousness and physiological / cardiorespiratory status by the radiology RN, with a total moderate sedation time of 22 minutes. Under real-time ultrasound guidance, the left IJ vein was accessed with a 21 gauge micropuncture needle; the needle tip within the vein was confirmed with ultrasound image documentation. The 018 wire would not advance centrally. Needle exchanged over the 018 guidewire for transitional dilator, which allowed advancement of a stiff angled glidewire, negotiated to the SVC. The transitional dilator was exchanged for a 5 French angiographic catheter, advanced into the IVC. A Palindrome 23 hemodialysis catheter was tunneled from the left anterior chest wall approach to the left IJ dermatotomy site. The MPA catheter was exchanged over an Amplatz wire for serial vascular dilators which allow placement of a peel-away sheath, through which the catheter was advanced under intermittent fluoroscopy, positioned with its tips in the proximal right atrium. Spot chest radiograph confirms good catheter position. No pneumothorax. Catheter was flushed and primed per protocol. Catheter secured externally with O Prolene sutures. The left IJ dermatotomy  site was closed with Dermabond. COMPLICATIONS: COMPLICATIONS None immediate FLUOROSCOPY TIME:  3 minutes 24 seconds; 12 mGy COMPARISON:  None IMPRESSION: 1. Technically successful placement of tunneled left IJ hemodialysis catheter with ultrasound and fluoroscopic guidance. Ready for routine use. ACCESS: Remains approachable for percutaneous intervention as needed. Electronically Signed   By: D Lucrezia Europe.D.   On: 09/02/2019 11:48   Ir UsKoreauide Vasc Access Left  Result Date: 09/02/2019 CLINICAL DATA:  Renal failure, needs durable venous access for hemodialysis. Currently using temporary dialysis catheter in the left IJ. History of right IJ occlusion. EXAM: TUNNELED HEMODIALYSIS CATHETER PLACEMENT WITH ULTRASOUND AND FLUOROSCOPIC GUIDANCE TECHNIQUE: The procedure, risks, benefits, and alternatives were explained to the family. Questions regarding the procedure were encouraged and answered. The family understands and consents to the procedure. Patient was receiving adequate prophylactic antibiotic coverage already.  Patency of the left IJ vein was confirmed with ultrasound with image documentation. An appropriate skin site was determined. Region was prepped using maximum barrier technique including cap and mask, sterile gown, sterile gloves, large sterile sheet, and Chlorhexidine as cutaneous antisepsis. The region was infiltrated locally with 1% lidocaine. Intravenous Fentanyl 104mg and Versed 1.521mwere administered as conscious sedation during continuous monitoring of the patient's level of consciousness and physiological / cardiorespiratory status by the radiology RN, with a total moderate sedation time of 22 minutes. Under real-time ultrasound guidance, the left IJ vein was accessed with a 21 gauge micropuncture needle; the needle tip within the vein was confirmed with ultrasound image documentation. The 018 wire would not advance centrally. Needle exchanged over the 018 guidewire for transitional dilator,  which allowed advancement of a stiff angled glidewire, negotiated to the SVC. The transitional dilator was exchanged for a 5 French angiographic catheter, advanced into the IVC. A Palindrome 23 hemodialysis catheter was tunneled from the left anterior chest wall approach to the left IJ dermatotomy site. The MPA catheter was exchanged over an Amplatz wire for serial vascular dilators which allow placement of a peel-away sheath, through which the catheter was advanced under intermittent fluoroscopy, positioned with its tips in the proximal right atrium. Spot chest radiograph confirms good catheter position. No pneumothorax. Catheter was flushed and primed per protocol. Catheter secured externally with O Prolene sutures. The left IJ dermatotomy site was closed with Dermabond. COMPLICATIONS: COMPLICATIONS None immediate FLUOROSCOPY TIME:  3 minutes 24 seconds; 12 mGy COMPARISON:  None IMPRESSION: 1. Technically successful placement of tunneled left IJ hemodialysis catheter with ultrasound and fluoroscopic guidance. Ready for routine use. ACCESS: Remains approachable for percutaneous intervention as needed. Electronically Signed   By: D Lucrezia Europe.D.   On: 09/02/2019 11:48   Dg Chest Port 1 View  Result Date: 09/04/2019 CLINICAL DATA:  Right pneumothorax. EXAM: PORTABLE CHEST 1 VIEW COMPARISON:  September 03, 2019. FINDINGS: Stable cardiomediastinal silhouette. Tracheostomy and feeding tubes are unchanged in position. Left internal jugular dialysis catheter is unchanged. Stable right-sided chest tube is noted. Stable probable minimal right basilar pneumothorax is noted laterally. Stable bilateral lung opacities are noted. Bony thorax is unremarkable. IMPRESSION: Stable support apparatus. Stable bilateral lung opacities are noted. Stable position of right-sided chest tube with probable minimal right basilar pneumothorax. Electronically Signed   By: JaMarijo Conception.D.   On: 09/04/2019 07:25   Dg Chest Port 1  View  Result Date: 09/03/2019 CLINICAL DATA:  Tracheostomy. EXAM: PORTABLE CHEST 1 VIEW COMPARISON:  September 01, 2019. FINDINGS: Tracheostomy tube is in good position. Interval placement of feeding tube which is seen entering stomach. Interval placement of left internal jugular dialysis catheter with distal tip in expected position of cavoatrial junction. Stable bilateral lung opacities are noted. Right-sided chest tube is noted with minimal right basilar pneumothorax. Bony thorax is unremarkable. IMPRESSION: Tracheostomy tube in good position. Interval placement of feeding tube and left internal jugular dialysis catheter. Stable right-sided chest tube is noted with minimal right basilar pneumothorax. Stable bilateral lung opacities are noted. Electronically Signed   By: JaMarijo Conception.D.   On: 09/03/2019 10:25   Dg Chest Port 1 View  Result Date: 09/01/2019 CLINICAL DATA:  [age]ear old male with chest tube placement. COVID-19 positive. EXAM: PORTABLE CHEST 1 VIEW COMPARISON:  Chest radiograph dated 08/31/2019 and chest CT dated 08/13/2019 FINDINGS: Support apparatus in stable position. No significant interval change in the appearance of the lungs and bilateral  confluent densities predominantly involving the peripheral/subpleural regions as well as bibasilar lungs since the prior radiograph. Right lung base cystic or bolus area appears similar to prior radiograph. No large or detectable pneumothorax. Faint linear density over the right apex appears similar to prior radiograph, likely artifactual and less likely represents pleural reflection. Stable cardiac silhouette. No acute osseous pathology. IMPRESSION: 1. No significant interval change in the appearance of the lungs compared to the prior radiograph. 2. Support apparatus in stable position. Electronically Signed   By: Anner Crete M.D.   On: 09/01/2019 09:55   Dg Chest Port 1 View  Result Date: 08/31/2019 CLINICAL DATA:  COVID-19 positive.   Tracheostomy tube. EXAM: PORTABLE CHEST 1 VIEW COMPARISON:  08/30/2019 FINDINGS: Tracheostomy tube in adequate position. Enteric tube courses into the stomach and off the film as tip is not visualized. Right medial basilar chest tube unchanged. Left IJ central venous catheter horizontally oriented over the SVC unchanged. Lungs are hypoinflated with persistent tiny right-sided pneumothorax. Stable opacification over the right midlung and right base. Stable hazy opacification over the left base/retrocardiac region. Remainder of the exam is unchanged. IMPRESSION: 1. Stable opacification over the right midlung and bibasilar regions. 2.  Stable tiny right-sided pneumothorax. 3.  Tubes and lines as described. Electronically Signed   By: Marin Olp M.D.   On: 08/31/2019 07:15   Dg Chest Port 1 View  Result Date: 08/30/2019 CLINICAL DATA:  Pneumothorax on right EXAM: PORTABLE CHEST 1 VIEW COMPARISON:  Chest radiograph 08/26/2019, 08/23/2019 FINDINGS: Stable cardiomediastinal contours. Unchanged for apparatus. Persistent small right basilar pneumothorax with chest tube in place. Persistent infiltrates/atelectasis in the right mid upper lung and left base. No large pleural effusion. No acute finding in the visualized skeleton. IMPRESSION: Unchanged chest radiograph with persistent small right basilar pneumothorax and atelectasis/infiltrates in the right mid to upper lung as well as left base. Electronically Signed   By: Audie Pinto M.D.   On: 08/30/2019 09:42   Dg Chest Port 1 View  Result Date: 08/26/2019 CLINICAL DATA:  Pneumothorax. EXAM: PORTABLE CHEST 1 VIEW COMPARISON:  08/23/2019. FINDINGS: Tracheostomy tube, left IJ line, feeding tube, right chest tube in stable position. Stable right base pneumothorax. Stable atelectatic changes/infiltrate right upper lung and left base. Heart size stable. IMPRESSION: 1. Lines and tubes including right chest tube in stable position. Stable right base pneumothorax. 2.  Stable right upper lung and left lower atelectatic changes/infiltrates. Chest is unchanged from prior exam. Electronically Signed   By: Marcello Moores  Register   On: 08/26/2019 10:36   Dg Chest Port 1 View  Result Date: 08/23/2019 CLINICAL DATA:  COVID-19 positive, endotracheal tube in place. EXAM: PORTABLE CHEST 1 VIEW COMPARISON:  Chest x-rays dated 08/22/2019. FINDINGS: Tubes and lines are stable in position, including a RIGHT-sided chest tube. Prominent lucency again noted at the RIGHT lung base, stable in the short-term interval, compatible with the air that was associated with a complicated pleural effusion as demonstrated on chest CT of 08/13/2019, at that time suspected to represent sequela of manipulation of a bronchial occlusion tube at bronchoscopy. Patchy airspace opacities are again seen bilaterally, stable, presumed pneumonia. No new lung findings. IMPRESSION: 1. Stable chest x-ray. Tubes and lines are stable in position, including the RIGHT-sided chest tube with tip positioned at the medial aspects of the RIGHT lung base 2. Patchy airspace opacities bilaterally, stable, presumed bilateral pneumonia. 3. Stable appearance of the collection of air at the RIGHT lung base, as described above. 4. No new  lung findings. Electronically Signed   By: Franki Cabot M.D.   On: 08/23/2019 09:04   Dg Chest Port 1 View  Result Date: 08/22/2019 CLINICAL DATA:  Status post tracheostomy. COVID-19 viral pneumonia. EXAM: PORTABLE CHEST 1 VIEW COMPARISON:  08/22/2019 FINDINGS: A new tracheostomy tube is seen in appropriate position. Feeding tube and bilateral central venous catheters remain in stable position. Tip of the right subclavian central venous catheter seen overlying the inferior right atrium. Heart size is within normal limits allowing for low lung volumes. Right chest tube remains in place. No pneumothorax visualized. Bullous disease again seen in right lung base. Increased atelectasis or airspace disease is  seen in the retrocardiac left lung base. Mild bilateral peripheral pulmonary airspace disease shows no significant change. IMPRESSION: New tracheostomy tube in appropriate position. Right subclavian Center venous catheter tip overlies the lower right atrium. Increased atelectasis or airspace disease in retrocardiac left lung base. Electronically Signed   By: Marlaine Hind M.D.   On: 08/22/2019 16:49   Dg Chest Port 1 View  Result Date: 08/22/2019 CLINICAL DATA:  Intubated. EXAM: PORTABLE CHEST 1 VIEW COMPARISON:  08/21/2019 FINDINGS: Endotracheal tube in satisfactory position. Feeding tube extending into the distal stomach with its tip not included. Right subclavian catheter tip in the region of the superior cavoatrial junction or upper right atrium. No significant change in bilateral interstitial and patchy opacities and right basilar bullous changes. No definite pneumothorax. Minimal right pleural fluid. Normal sized heart. Thoracic spine degenerative changes. IMPRESSION: 1. No significant change in bilateral pneumonia and interstitial disease and right basilar bullous changes. 2. Minimal right pleural fluid. Electronically Signed   By: Claudie Revering M.D.   On: 08/22/2019 07:12   Dg Chest Port 1 View  Result Date: 08/21/2019 CLINICAL DATA:  COVID-19, intubation EXAM: PORTABLE CHEST 1 VIEW COMPARISON:  Portable exam 0535 hours compared to 08/20/2019 FINDINGS: Tip of endotracheal tube projects 3.4 cm above carina. Feeding tube extends into stomach. LEFT jugular dual-lumen central venous catheter tip projects over confluence of the SVC and LEFT brachiocephalic vein. Tip of RIGHT jugular line projects over RIGHT atrium; recommend withdrawal 4 cm to place tip at cavoatrial junction. RIGHT thoracostomy tube unchanged. Stable heart size mediastinal contours. Patchy BILATERAL airspace infiltrates consistent with pneumonia. No pleural effusion. Tiny loculated pneumothorax at lateral RIGHT base. IMPRESSION: Tiny  residual RIGHT basilar atelectasis despite thoracostomy tube. Persistent BILATERAL pulmonary infiltrates consistent with pneumonia. Electronically Signed   By: Lavonia Dana M.D.   On: 08/21/2019 08:44   Dg Chest Port 1 View  Result Date: 08/20/2019 CLINICAL DATA:  68 year old male with a history of intubation EXAM: PORTABLE CHEST 1 VIEW COMPARISON:  08/19/2019, 08/18/2019, 08/17/2019 FINDINGS: Cardiomediastinal silhouette unchanged in size and contour. Low lung volumes persist. Unchanged endotracheal tube which terminates 2.4 cm above the carina. Unchanged gastric tube terminating in the stomach. Unchanged left IJ temporary hemodialysis catheter which terminates at the confluence of the left brachiocephalic vein and SVC. Unchanged right subclavian central venous catheter with the tip appearing to terminate superior right atrium. Patchy opacities of the bilateral lungs, unchanged from the prior. No pneumothorax. Blunting of left costophrenic angle with opacity in the retrocardiac region. IMPRESSION: Similar appearance of the lungs with low lung volumes and bilateral patchy opacities, likely combination of atelectasis/consolidation, developing chronic changes, and possible small left pleural effusion. Unchanged endotracheal tube, left IJ temporary hemodialysis catheter, right subclavian central venous catheter, and gastric tube. Electronically Signed   By: Corrie Mckusick D.O.  On: 08/20/2019 07:50   Dg Chest Port 1 View  Result Date: 08/19/2019 CLINICAL DATA:  Pneumonia and COVID-19. EXAM: PORTABLE CHEST 1 VIEW COMPARISON:  August 18, 2019. FINDINGS: Stable cardiomediastinal silhouette. Endotracheal and nasogastric tubes are unchanged in position. Left internal jugular and right subclavian catheters are unchanged in position. Stable right-sided chest tube is noted with probable stable loculated right basilar hydropneumothorax. Stable opacities are noted throughout both lungs concerning for pneumonia. Bony  thorax is unremarkable. IMPRESSION: Stable support apparatus. Stable right-sided chest tube with probable right basilar hydropneumothorax. Stable bilateral lung opacities. Electronically Signed   By: Marijo Conception M.D.   On: 08/19/2019 07:44   Dg Chest Port 1 View  Result Date: 08/18/2019 CLINICAL DATA:  Follow-up endotracheal tube EXAM: PORTABLE CHEST 1 VIEW COMPARISON:  08/17/2019 FINDINGS: Cardiac shadow is stable. Right subclavian central line is noted in satisfactory position. Endotracheal tube and gastric catheter are again seen and stable. Left jugular dialysis catheter is noted. Patchy opacities are again seen in both lungs. Right chest tube is again noted with loculated hydropneumothorax stable from the prior exam. IMPRESSION: Overall stable appearance of the chest. Electronically Signed   By: Inez Catalina M.D.   On: 08/18/2019 03:09   Dg Chest Port 1 View  Result Date: 08/17/2019 CLINICAL DATA:  Right-sided pneumothorax EXAM: PORTABLE CHEST 1 VIEW COMPARISON:  08/16/2019 FINDINGS: No significant interval change in AP portable examination with right-sided chest tube in position and a small, persistent, loculated appearing hydropneumothorax about the right lung base. There is redemonstrated, extensive bilateral dense heterogeneous and interstitial airspace opacity, worse on the right. Support apparatus including endotracheal tubes, esophagogastric tube, left neck and right subclavian vascular catheters are unchanged. Cardiomegaly. IMPRESSION: 1. No significant interval change in AP portable examination with right-sided chest tube in position and a small, persistent, loculated appearing hydropneumothorax about the right lung base. 2. There is redemonstrated, extensive bilateral dense heterogeneous and interstitial airspace opacity, worse on the right, consistent with multifocal infection. 3.  Unchanged support apparatus. Electronically Signed   By: Eddie Candle M.D.   On: 08/17/2019 15:54   Dg  Chest Port 1 View  Result Date: 08/16/2019 CLINICAL DATA:  68 year old male with positive COVID-19. EXAM: PORTABLE CHEST 1 VIEW COMPARISON:  Earlier radiograph dated 08/16/2019 FINDINGS: Support lines and tubes as seen previously. Diffuse bilateral airspace opacities similar to prior radiograph. Apparent area of lucency at the right costophrenic angle may be artifactual and related to skin fold or represent a small residual right pneumothorax. Stable cardiac silhouette. No acute osseous pathology. IMPRESSION: 1. Artifact versus small residual right pneumothorax at the right costophrenic angle. 2. Diffuse bilateral airspace opacities similar to prior radiograph. 3. Support lines and tubes as seen previously. Electronically Signed   By: Anner Crete M.D.   On: 08/16/2019 21:01   Dg Chest Port 1 View  Result Date: 08/16/2019 CLINICAL DATA:  Hypoxia EXAM: PORTABLE CHEST 1 VIEW COMPARISON:  August 15, 2019 FINDINGS: Endotracheal tube tip is 4.2 cm above the carina. Nasogastric tube tip and side port are below the diaphragm. Right subclavian catheter tip is in the right atrium. Chest tube noted on the right inferiorly, unchanged in position. Left internal jugular catheter tip is at the junction of the left innominate vein and superior vena cava. There is a catheter with its tip overlying the right lower lobe bronchus medially, stable. No pneumothorax is evident currently. There is persistent airspace consolidation in the right mid and lower lung zones. There is  patchy opacity in the left mid and lower lung zones, stable. No new opacity evident. Heart is upper normal in size with pulmonary vascular normal. No adenopathy. No bone lesions. IMPRESSION: Stable tube and catheter positions. No pneumothorax evident currently. Airspace opacity bilaterally with areas of consolidation on the right, stable. No new opacity evident. Stable cardiac silhouette. Electronically Signed   By: Lowella Grip III M.D.   On:  08/16/2019 09:06   Dg Chest Port 1 View  Result Date: 08/15/2019 CLINICAL DATA:  Orogastric tube placement. EXAM: PORTABLE CHEST 1 VIEW COMPARISON:  Same day. FINDINGS: Stable cardiomediastinal silhouette. Distal tip of enteric tube is seen in proximal stomach. Endotracheal tube is unchanged. Left internal jugular catheter is unchanged. Right subclavian catheter is unchanged. Stable position of right-sided basilar chest tube is noted without pneumothorax. Stable bilateral lung opacities are noted. Stable catheter seen entering right mainstem bronchus compared to prior exam. Bony thorax is unremarkable. IMPRESSION: Feeding tube has been removed. There has been interval placement of what appears to be nasogastric tube with tip in expected position of proximal stomach. Stable bilateral lung opacities and other support apparatus are noted. Electronically Signed   By: Marijo Conception M.D.   On: 08/15/2019 14:55   Dg Chest Port 1 View  Result Date: 08/15/2019 CLINICAL DATA:  Chest tube, shortness of breath EXAM: PORTABLE CHEST 1 VIEW COMPARISON:  08/14/2019 FINDINGS: Support devices including right basilar chest tube remain in place, unchanged. No definite pneumothorax currently. Patchy bilateral airspace opacities are again noted, unchanged. Heart is borderline in size. IMPRESSION: Patchy bilateral airspace disease again noted, unchanged. No definite visible pneumothorax. Electronically Signed   By: Rolm Baptise M.D.   On: 08/15/2019 07:58   Dg Chest Port 1 View  Result Date: 08/14/2019 CLINICAL DATA:  Pneumothorax, chest tube EXAM: PORTABLE CHEST 1 VIEW COMPARISON:  08/14/2019 FINDINGS: Right chest tube and remainder support devices are stable. No pneumothorax. Patchy bilateral airspace opacities, right greater than left again noted, stable. Mild cardiomegaly. No visible effusions. IMPRESSION: No visible pneumothorax.  No change since prior study Electronically Signed   By: Rolm Baptise M.D.   On:  08/14/2019 11:46   Dg Chest Port 1 View  Result Date: 08/14/2019 CLINICAL DATA:  Acute respiratory failure with hypoxemia. EXAM: PORTABLE CHEST 1 VIEW COMPARISON:  August 13, 2019. FINDINGS: Stable cardiomediastinal silhouette. Endotracheal and feeding tubes are unchanged in position. Stable left internal jugular and right subclavian catheters are noted. Stable catheter is seen extending into right mainstem bronchus. Stable bilateral lung opacities are noted. Stable right-sided chest tube without pneumothorax. Small bilateral pleural effusions may be present. Bony thorax is unremarkable. IMPRESSION: Grossly stable support apparatus. Stable bilateral lung opacities as described above. Electronically Signed   By: Marijo Conception M.D.   On: 08/14/2019 07:43   Dg Chest Port 1 View  Result Date: 08/13/2019 CLINICAL DATA:  Endotracheal to EXAM: PORTABLE CHEST 1 VIEW COMPARISON:  [id number] hours FINDINGS: Endotracheal tube and 2 central venous line unchanged. NG tube exchanged for feeding tube. A catheter extends through the lumen of the endotracheal tube into the RIGHT mainstem bronchus. There is hydropneumothorax at the RIGHT lung base again noted. There is diffuse patchy airspace disease which is slightly worse on the RIGHT and similar on the LEFT. IMPRESSION: 1. Stable support apparatus. 2. No change in subpulmonic RIGHT hydropneumothorax. 3. No change in patchy airspace disease in the RIGHT upper lobe. 4. Slight worsening airspace disease in the LEFT lung. Electronically Signed  By: Suzy Bouchard M.D.   On: 08/13/2019 17:39   Dg Chest Port 1 View  Result Date: 08/13/2019 CLINICAL DATA:  Right pneumothorax EXAM: PORTABLE CHEST 1 VIEW COMPARISON:  08/13/2019 FINDINGS: Support devices including right chest tube remain in place, unchanged. Lucency again noted over the right lung base which could reflect loculated right pneumothorax. Airspace disease throughout the lungs bilaterally, right greater than  left with improvement in aeration in the left lung since prior study. Heart is normal size. IMPRESSION: Continued lucency over the right lung base could reflect loculated right pneumothorax. Bilateral airspace disease, right greater than left. Improved airspace disease on the left since prior study. Electronically Signed   By: Rolm Baptise M.D.   On: 08/13/2019 11:34   Dg Chest Port 1 View  Result Date: 08/13/2019 CLINICAL DATA:  Respiratory failure EXAM: PORTABLE CHEST 1 VIEW COMPARISON:  Yesterday FINDINGS: Endotracheal tube tip is at the clavicular heads. Right subclavian central line with tip at the upper cavoatrial junction. Endobronchial blocker on the right in stable position over the right hilum. The orogastric tube reaches the stomach. Right base chest tube which crosses the midline, presumably anterior and accentuated by leftward rotation. Basal pneumothorax on the right is unchanged. Unchanged bilateral airspace disease. IMPRESSION: 1. Unchanged hardware positioning including endobronchial blocker tip overlapping the right hilum. 2. Unchanged bilateral airspace disease with right base lucency more likely loculated pneumothorax rather than cavitary pneumonia Electronically Signed   By: Monte Fantasia M.D.   On: 08/13/2019 09:04   Dg Chest Port 1 View  Result Date: 08/12/2019 CLINICAL DATA:  Respiratory failure. EXAM: PORTABLE CHEST 1 VIEW COMPARISON:  Chest x-ray 08/10/2019. FINDINGS: Endotracheal tube, dual-lumen left IJ line, right PICC line in stable position. Right lower lobe bronchus balloon occlusion catheter stable position. Right chest tube in stable position. Small right base pneumothorax cannot be excluded on today's exam. Bibasilar atelectasis/infiltrates again noted. No interim change. Tiny bilateral pleural effusions. IMPRESSION: 1. Endotracheal tube, dual-lumen left IJ line, right PICC line stable position. Right lower lobe bronchus balloon occlusion catheter in stable position.  Right chest tube in stable position. 2. Small right base pneumothorax cannot be excluded on today's exam. 3. Bibasilar atelectasis/infiltrates again noted without interim change. Critical Value/emergent results were called by telephone at the time of interpretation on 08/12/2019 at 7:31 am to the patient's nurse, who verbally acknowledged these results. Electronically Signed   By: Marcello Moores  Register   On: 08/12/2019 07:33   Dg Chest Port 1 View  Result Date: 08/10/2019 CLINICAL DATA:  COVID positive EXAM: PORTABLE CHEST 1 VIEW COMPARISON:  08/09/2019 FINDINGS: Left lung apex and lateral costophrenic angle are partially visualized. Endotracheal tube terminates 5 cm above the carina. Right chest tube.  No pneumothorax is seen. Left IJ dual lumen catheter terminates in the mid SVC. Right subclavian venous catheter terminates at the cavoatrial junction. Enteric tube terminates in the proximal stomach. Stable selective balloon occlusion catheter in the right lower lobe bronchus. Multifocal patchy opacities, lower lung predominant, grossly unchanged. No definite pleural effusions. The heart is normal in size. IMPRESSION: Multifocal patchy opacities, grossly unchanged, compatible with pneumonia in this patient with known COVID. Stable right chest tube.  No pneumothorax is seen. Endotracheal tube terminates 5 cm above the carina. Additional stable support apparatus as above. Electronically Signed   By: Julian Hy M.D.   On: 08/10/2019 16:59   Dg Chest Port 1 View  Result Date: 08/09/2019 CLINICAL DATA:  Right-sided pneumothorax. Acute respiratory failure.  COVID-19. EXAM: PORTABLE CHEST 1 VIEW COMPARISON:  08/08/2019; 08/07/2019; chest CT-07/25/2019 FINDINGS: Grossly unchanged cardiac silhouette and mediastinal contours. Stable positioning of support apparatus with endotracheal tube overlying tracheal air column with tip superior to the carina, left jugular approach central venous catheter tips projected over  the superior SVC, right subclavian approach central venous catheter tip projects over the superior caval junction, enteric tube tip and side port projected over the gastric antrum and right-sided chest tube and selective balloon occlusion catheter involving the right mainstem bronchus. No pneumothorax. Redemonstrated extensive bilateral slightly nodular airspace opacities. Left basilar/retrocardiac consolidative opacities are unchanged. No new focal airspace opacities. No definite pleural effusion. No acute osseous abnormalities. IMPRESSION: 1.  Stable positioning of support apparatus.  No pneumothorax. 2. No significant change in extensive bilateral airspace opacities compatible with provided history of COVID-19 infection. Electronically Signed   By: Sandi Mariscal M.D.   On: 08/09/2019 05:18   Dg Chest Port 1 View  Result Date: 08/08/2019 CLINICAL DATA:  Pulmonary infiltrates. Acute respiratory failure with hypoxemia. COVID-19. EXAM: PORTABLE CHEST 1 VIEW COMPARISON:  08/07/2019 and 08/06/2019 FINDINGS: Endotracheal tube in good position 4.7 cm above the carina. Uniblocker tube tip is in the right lower lobe. Left jugular vein catheter tip is in the SVC at the level of the carina. Right subclavian catheter tip is in the right atrium. Right-sided chest tube at the base, unchanged. NG tube tip below the diaphragm. There is improved aeration at the right lung base and right midzone. No change in the infiltrates at the left base and left midzone. Heart size and vascularity are normal. IMPRESSION: 1. Improved aeration at the right lung base. No change in the infiltrates at the left base and left midzone. No residual pneumothorax. 2. Tubes and lines appear essentially unchanged. Electronically Signed   By: Lorriane Shire M.D.   On: 08/08/2019 16:27   Dg Chest Port 1 View  Result Date: 08/07/2019 CLINICAL DATA:  Evaluate endotracheal tube position. COVID-19 positive. EXAM: PORTABLE CHEST 1 VIEW COMPARISON:   08/06/2019 FINDINGS: Endotracheal tube terminates 4.9 cm above carina. Nasogastric tube extends beyond the inferior aspect of the film. A right-sided chest tube remains in place. The right-sided pleural drain has been removed. Left internal jugular line tip at mid SVC. Right subclavian line tip at high right atrium, similar. Mild cardiomegaly. Increased fluid within the right minor fissure. The previously described right apical pneumothorax is either improved or resolved. Possible trace pleural air remaining. Increased right base airspace disease. Relatively similar patchy right upper and left mid to lower lung pulmonary opacities. IMPRESSION: Appropriate position of endotracheal tube. Single right chest tube remaining in place, with improved to resolved right apical pneumothorax. Increased right pleural fluid with worsened right base airspace disease. Otherwise similar multifocal pneumonia. Electronically Signed   By: Abigail Miyamoto M.D.   On: 08/07/2019 20:18   Dg Chest Port 1 View  Result Date: 08/06/2019 CLINICAL DATA:  Chest tube, follow-up pneumothorax EXAM: PORTABLE CHEST 1 VIEW COMPARISON:  Chest radiograph from earlier today. FINDINGS: Endotracheal tube tip is 4.2 cm above the carina. Enteric tube enters stomach with the tip not seen on this image. Left internal jugular central venous catheter terminates in the middle third of the SVC. Right subclavian central venous catheter terminates over the right atrium. Stable right pigtail chest tube with tip in the peripheral lower right pleural space. Separate right basilar chest tube. Stable cardiomediastinal silhouette with normal heart size. Small residual right apical pneumothorax, significantly decreased.  No left pneumothorax. Midline mediastinum. No pleural effusion. Extensive patchy opacities throughout both lungs appear unchanged. IMPRESSION: 1. Small residual right apical pneumothorax, significantly decreased. 2. Stable extensive patchy opacities  throughout both lungs. 3. Well-positioned support structures as described. Electronically Signed   By: Ilona Sorrel M.D.   On: 08/06/2019 15:25   Dg Chest Port 1 View  Addendum Date: 08/06/2019   ADDENDUM REPORT: 08/06/2019 13:46 ADDENDUM: These results were called by telephone at the time of interpretation on 08/06/2019 at 1:43 pm to provider Dr. Roselie Awkward, who verbally acknowledged these results. Electronically Signed   By: Aletta Edouard M.D.   On: 08/06/2019 13:46   Result Date: 08/06/2019 CLINICAL DATA:  Replacement of non tunneled dialysis catheter. Respiratory failure secondary to COVID-19 pneumonia. EXAM: PORTABLE CHEST 1 VIEW COMPARISON:  Film earlier today at 1119 hours FINDINGS: Replaced left jugular non tunneled dialysis catheter now projects inferiorly with the tip in the SVC. Other support apparatus stable with the endotracheal tube tip approximately 5 cm above the carina. Right lateral pneumothorax is slightly larger with stable positioning of a lateral pigtail thoracostomy tube. Bilateral severe pneumonia appears stable since the prior chest x-ray. Lucency abutting the right hemidiaphragm may relate to a component subpulmonic air. However, free intraperitoneal air under the right hemidiaphragm is not entirely excluded by semi-erect chest x-ray. IMPRESSION: 1. Replaced left jugular non tunneled dialysis catheter tip is in the SVC. 2. Slightly larger right lateral pneumothorax. 3. Cannot exclude free air under the right hemidiaphragm. However, the appearance may relate to relative lucency of the lung at the right lung base and component of basilar pneumothorax as well. Electronically Signed: By: Aletta Edouard M.D. On: 08/06/2019 13:41   Dg Chest Port 1 View  Result Date: 08/06/2019 CLINICAL DATA:  COVID.  Insertion of dialysis catheter EXAM: PORTABLE CHEST 1 VIEW COMPARISON:  None. FINDINGS: Left internal jugular dialysis catheter courses across the midline and likely passes into  the right innominate vein. Right central line tip is at the cavoatrial junction. Endotracheal tube and NG tube are unchanged. Cardiomegaly. Patchy bilateral airspace opacities again noted, unchanged. Right chest tube remains in place with right basilar pneumothorax, stable. IMPRESSION: Interval placement of left internal jugular Vas-Cath. The tip likely passes into the right innominate vein. No left pneumothorax. Right chest tube remains in place with small right basilar pneumothorax, stable. Stable patchy bilateral airspace opacities. Electronically Signed   By: Rolm Baptise M.D.   On: 08/06/2019 11:56   Dg Chest Port 1 View  Result Date: 08/06/2019 CLINICAL DATA:  68 year old male central line placement. COVID-19. EXAM: PORTABLE CHEST 1 VIEW COMPARISON:  0007 hours today. FINDINGS: Portable AP semi upright view at 0236 hours. The right IJ central line is no longer identified. A right subclavian approach central line now is in place. The tip projects at the level of the right atrium about 2.5 centimeters below the expected level of the cavoatrial junction. ETT tip is at the level the clavicles. Stable visible enteric tube. Stable right chest tube. Small volume right pneumothorax appears stable along with patchy right mid and lower lung opacity and confluent left lung base opacity. The left upper lobe remains clear. Stable cardiac size and mediastinal contours. IMPRESSION: 1. Right subclavian approach central line placed, tip at the level of the right atrium about 2.5 cm below the cavoatrial junction. 2.  Otherwise stable lines and tubes. 3. Stable small volume right pneumothorax, bilateral pneumonia. Electronically Signed   By: Herminio Heads.D.  On: 08/06/2019 03:49   Dg Chest Port 1 View  Result Date: 08/06/2019 CLINICAL DATA:  PICC line EXAM: PORTABLE CHEST 1 VIEW COMPARISON:  08/05/2019, 08/03/2019, 08/02/2019 FINDINGS: Endotracheal tube tip is about 3.3 cm superior to the carina. Esophageal tube tip  below the diaphragm but non included. Right lower chest tube remains in place. Residual small pneumothorax at the right lower lateral chest, CP angle and lung base. No upper extremity venous catheters are seen. There is catheter tubing coiled over the right neck/thoracic inlet. Multifocal bilateral consolidations are grossly unchanged, worst in the left base. Stable cardiomediastinal silhouette. IMPRESSION: 1. No upper extremity venous catheter is seen. Possible catheter tubing looped over the right neck 2. Small right lateral and basilar pneumothorax slightly more apparent compared to most recent prior 3. Stable multifocal airspace consolidations. Electronically Signed   By: Donavan Foil M.D.   On: 08/06/2019 00:38   Dg Chest Port 1 View  Result Date: 08/05/2019 CLINICAL DATA:  Acute respiratory failure EXAM: PORTABLE CHEST 1 VIEW COMPARISON:  08/05/2019 FINDINGS: Endotracheal tube appropriately positioned in the mid trachea approximately 4 cm from the carina. Transesophageal tube tip and side port distal to the GE junction, terminating near the gastric antrum. Pigtail right pleural catheter is in stable position. Additional support devices and monitoring leads overlie the chest. There is a small residual right apical pneumothorax (see annotated image). There is increasing dense bilateral airspace opacity towards the bases which could reflect a combination of atelectasis and residual consolidated lung. No acute osseous or soft tissue abnormality. IMPRESSION: 1. Stable satisfactory positioning of lines and tubes. 2. Small residual right apical pneumothorax. 3. Increasing dense bilateral airspace disease towards the bases, which could reflect a combination of worsening atelectasis and residual consolidated lung. Electronically Signed   By: Lovena Le M.D.   On: 08/05/2019 23:09   Dg Abd Decub  Result Date: 08/06/2019 CLINICAL DATA:  Rule out pneumoperitoneum EXAM: ABDOMEN - 1 VIEW DECUBITUS COMPARISON:   08/02/2019 abdominal radiographs FINDINGS: No evidence of pneumatosis or pneumoperitoneum. No disproportionately dilated small bowel loops. Moderate gas in the nondependent colon. Enteric tube tip is seen in the proximal stomach. IMPRESSION: 1. No evidence of pneumoperitoneum. 2. Moderate gas in the nondependent colon. Nonobstructive bowel gas pattern. 3. Enteric tube tip in the proximal stomach. Electronically Signed   By: Ilona Sorrel M.D.   On: 08/06/2019 17:16   Dg Abd Portable 1v  Result Date: 08/30/2019 CLINICAL DATA:  NG tube placement EXAM: PORTABLE ABDOMEN - 1 VIEW COMPARISON:  08/16/2019 FINDINGS: There is a nasogastric tube with the tip projecting over the stomach. There is no bowel dilatation to suggest obstruction. There is no evidence of pneumoperitoneum, portal venous gas or pneumatosis. There are no pathologic calcifications along the expected course of the ureters. The osseous structures are unremarkable. IMPRESSION: Nasogastric tube with the tip projecting over the stomach. Electronically Signed   By: Kathreen Devoid   On: 08/30/2019 15:56   Dg Abd Portable 1v  Result Date: 08/16/2019 CLINICAL DATA:  OG tube placement EXAM: PORTABLE ABDOMEN - 1 VIEW COMPARISON:  08/13/2019 FINDINGS: Nasogastric tube with the tip projecting over the stomach. There is no bowel dilatation to suggest obstruction. There is no evidence of pneumoperitoneum, portal venous gas or pneumatosis. There are no pathologic calcifications along the expected course of the ureters. The osseous structures are unremarkable. IMPRESSION: Nasogastric tube with the tip projecting over the stomach. Electronically Signed   By: Kathreen Devoid   On: 08/16/2019  12:09   Dg Abd Portable 1v  Result Date: 08/13/2019 CLINICAL DATA:  ETT advancement, Feeding tube placement EXAM: PORTABLE ABDOMEN - 1 VIEW COMPARISON:  Radiograph 08/10/2019 FINDINGS: NG tube has been replaced with a feeding tube. The tip of the feeding tube is within the  second portion the duodenum. Gas in the stomach. No dilated loops of large or small bowel. Bibasilar effusions and atelectasis. IMPRESSION: 1. Feeding tube with tip in the second portion the duodenum. 2. No evidence of bowel obstruction. 3. Bibasilar effusions and atelectasis. Electronically Signed   By: Suzy Bouchard M.D.   On: 08/13/2019 17:30     Time Spent in minutes  30     Desiree Hane M.D on 09/04/2019 at 4:02 PM  To page go to [email or web address] - password [facility]

## 2019-09-04 NOTE — Progress Notes (Signed)
Daily Progress Note   Patient Name: [name]       Date: 09/04/2019 DOB: [birth year]  Age: 68 y.o. MRN#: [id number] Attending Physician: Albert Hane, MD Primary Care Physician: Albert Drown, MD Admit Date: 07/25/2019  Reason for Consultation/Follow-up: Establishing goals of care  Subjective: Patient in bed. Opens eyes to my voice- does not follow commands. Nods "no" to question asking if he's pain. Does not answer any other yes/no questions.  Coughing up thick, yellow, blood tinged secretions.  Met with his spouse- Gave emotional support as [name] was processing news that her cousin and a young friend who she considered a son had just been in a car accident this morning, and the young person died.  We then discussed concern re: patient not recovering to functional status. In patient's current state- will need PEG and LTACH (after chest tube is out) if continued aggressive medical care is desired.  [name] asked why providers would continue to offer interventions if it wasn't making him better. We discussed that different patient's and families have varied definitions of "better" and quality of life. We discussed that many interventions can prolong keeping someone alive- but does not always improve to what a patient's goal would be. That is why it is important to discuss what patient's [name] would be.  [name] states patient would not want to continue aggressive medical care if it was not going to "get him better". Her definition of getting better is being able to animatedly interact with family and friends, being able to live independently - not in an [name].  Would not want to be dependent on a feeding tube.  We discussed comfort care and what that entails. Stopping dialysis, artificial  feeding, other life prolonging measures- and focusing on keeping patient comfortable as he experiences natural, dying process.   Review of Systems  Unable to perform ROS: Acuity of condition    Length of Stay: 41  Current Medications: Scheduled Meds:   aspirin  325 mg Per Tube Daily   atorvastatin  40 mg Oral q1800   B-complex with vitamin C  1 tablet Per Tube Daily   chlorhexidine  15 mL Mouth/Throat BID   Chlorhexidine Gluconate Cloth  6 each Topical Daily   Chlorhexidine Gluconate Cloth  6 each Topical Q0600   Chlorhexidine  Gluconate Cloth  6 each Topical Q0600   darbepoetin (ARANESP) injection - NON-DIALYSIS  100 mcg Subcutaneous Q Wed-1800   feeding supplement (PRO-STAT SUGAR FREE 64)  30 mL Per Tube QID   Gerhardt's butt cream   Topical TID   heparin injection (subcutaneous)  5,000 Units Subcutaneous Q8H   mouth rinse  15 mL Mouth Rinse 10 times per day   midodrine  10 mg Per Tube TID WC   pantoprazole sodium  40 mg Per Tube Daily   sodium zirconium cyclosilicate  10 g Oral Daily   sucralfate  1 g Oral Q6H   white petrolatum        Continuous Infusions:  sodium chloride Stopped (09/01/19 0000)    ceFAZolin (ANCEF) IV 1 g (09/03/19 2301)   dextrose Stopped (08/16/19 1623)   feeding supplement (NEPRO CARB STEADY) 45 mL/hr at 09/04/19 0600    PRN Meds: sodium chloride, acetaminophen (TYLENOL) oral liquid 160 mg/5 mL, dextrose, heparin, heparin, heparin, heparin, [START ON 09/05/2019] heparin, metoprolol tartrate, pneumococcal 23 valent vaccine, polyethylene glycol  Physical Exam Vitals signs and nursing note reviewed.  Constitutional:      Appearance: He is ill-appearing.     Comments: lethargic  Pulmonary:     Comments: Thick, yellow, blood tinged secretions, cough            Vital Signs: BP 116/65 (BP Location: Left Wrist)    Pulse (!) 117    Temp 99.2 F (37.3 C) (Oral)    Resp (!) 25    Ht _0  (1.778 m)    Wt 84 kg    SpO2 100%    BMI  26.57 kg/m  SpO2: SpO2: 100 % O2 Device: O2 Device: Tracheostomy Collar O2 Flow Rate: O2 Flow Rate (L/min): 5 L/min  Intake/output summary:   Intake/Output Summary (Last 24 hours) at 09/04/2019 1235 Last data filed at 09/04/2019 1145 Gross per 24 hour  Intake 980 ml  Output 375 ml  Net 605 ml   LBM: Last BM Date: 09/04/19 Baseline Weight: Weight: 96 kg Most recent weight: Weight: 84 kg       Palliative Assessment/Data: PPS: 10%      Patient Active Problem List   Diagnosis Date Noted   Advanced care planning/counseling discussion    Goals of care, counseling/discussion    Palliative care by specialist    Cerebral thrombosis with cerebral infarction 08/28/2019   Tracheostomy status ([name])    Acute respiratory distress syndrome (ARDS) due to COVID-19 virus ([name])    Chest tube in place    Primary spontaneous pneumothorax    Acute respiratory failure ([name])    Gastric ulcer with hemorrhage 08/10/2019   Constipation 08/09/2019   Atrial fibrillation with RVR ([name]) 08/07/2019   Acute renal failure (ARF) ([name]) 08/06/2019   GI bleed 08/06/2019   Pneumothorax on right    Fluid overload 07/27/2019   Pneumonia due to COVID-19 virus 07/25/2019   Acute respiratory failure due to COVID-19 [facility]) 07/25/2019   AKI (acute kidney injury) ([name]) 07/25/2019   Dyspepsia 11/26/2014   BPH (benign prostatic hyperplasia) 10/31/2013   Achilles rupture 04/05/2011   S/P Achilles tendon repair 04/05/2011    Palliative Care Assessment & Plan   Patient Profile: 68 y.o.malewith a medical history of GERD. He was admitted initially on 07/25/19 at [name] (transferred to [name]) with complaints of shortness of breath after being diagnosed with COVID on 07/13/19. He completed his treatment course of remdesivir, decadron, and convalescent plasma in addition  to requiring intubation for respiratory support and IV antibiotics for possible pneumonia. He was found to have MSSA bacteremia on  08/02/19 and started on Ancef. During his [name] course he developed a pneumothorax requiring pig tail chest tube (10/13), developed worsening hypotension and renal failure (10/14) started on CRRT (10/15) later transitioning to iHD (11/3). [name] required a tracheostomy on 10/30 and have been able to wean on ATC (11/4). MRI on 11/3 showed small acute to subacute infarcts involving cerebral cortex and right cerebellum and large bilateral mastoid effusions. He remains on pressor support. Palliative Medicine team consulted for goals of care.  Assessment/Recommendations/Plan  GOC- would be for patient to be able to talk, eat, interact with family, live at home- [name] states if patient is not heading in that direction patient would not wish to continue aggressive medical care [name] is going to discuss [place] with family and consider transition to comfort care measures only. PMT contact information given- she will follow up with PMT or primary MD over there weekend with decisions PMT will continue to follow and help support decision making  Goals of Care and Additional Recommendations: Limitations on Scope of Treatment: Full Scope Treatment  Code Status: DNR  Prognosis:  Unable to determine  Discharge Planning: To Be Determined  Care plan was discussed with Dr. Lamonte Sakai, patient's RN, patient's spouse.   Thank you for allowing the Palliative Medicine Team to assist in the care of this patient.   Time In: 1205 Time Out: 1250 Total Time 45 mins Prolonged Time Billed no      Greater than 50%  of this time was spent counseling and coordinating care related to the above assessment and plan.  Mariana Kaufman, AGNP-C Palliative Medicine   Please contact Palliative Medicine Team phone at [phone] for questions and concerns.

## 2019-09-04 NOTE — Progress Notes (Signed)
Physical Therapy Treatment Patient Details Name: [name] MRN: [id number] DOB: 09-22-1951 Today's Date: 09/04/2019    History of Present Illness 68 y/o male diagnosed with COVID on 9/29 presented to the [facility] ED on 10/2 with dyspnea, hypoxemia requiring intubation.  Admitted to [facility], developed AKI requiring CRRT, pneumothorax requiring chest tube and had a large bronchopleural fistula treated for 10 days with a bronchial blocker.  Tracheostomy placed on 10/30. Tested COVID neg x2 on 11/2. MRI on 11/4 showed There is cortical restricted diffusion posteriorly in the right frontal lobe consistent with acute infarct. Additional smaller regions of diffusion abnormality are present in the posterior left frontal lobe, anterior left frontal lobe, and right greater than left medial parietal lobes as well as right cerebellum.     PT Comments    Pt has made good progress since last treatment session. Pt able to answer questions appropriately with nodding yes/no. Pt has ability to move R foot and squeeze R hand on command with increased time. Focus of session increasing WB and attention to L side, and stretching pectoral muscles and neck muscles from R sided preference. Pt able to respond that he is not in pain but is fearful with sitting EoB. PT will work to provide more verbal cuing and education to what is being done to help alleviate anxiety. D/c plan remains appropriate at this time. PT will continue to follow acutely.    Follow Up Recommendations  SNF     Equipment Recommendations  Other (comment)(TBD at next venue)       Precautions / Restrictions Precautions Precautions: None Restrictions Weight Bearing Restrictions: No    Mobility  Bed Mobility Overal bed mobility: Needs Assistance Bed Mobility: Rolling;Sidelying to Sit;Sit to Sidelying Rolling: Total assist;+2 for safety/equipment;+2 for physical assistance Sidelying to sit: Total assist;+2 for physical assistance;+2 for  safety/equipment     Sit to sidelying: +2 for physical assistance;Total assist;+2 for safety/equipment General bed mobility comments: Total A +2 for bed mobility to bring BLEs towards EOB and then elevate trunk into upright posture  Transfers                 General transfer comment: Defered for safety        Balance Overall balance assessment: Needs assistance Sitting-balance support: Feet supported;Bilateral upper extremity supported;No upper extremity supported Sitting balance-Leahy Scale: Zero Sitting balance - Comments: requires assist for maintaining balance, no righting response at all in any direction                                    Cognition Arousal/Alertness: Awake/alert Behavior During Therapy: Flat affect Overall Cognitive Status: Difficult to assess                                 General Comments: Pt able to nod appropriately to questions.       Exercises Other Exercises Other Exercises: Focus of session, PROM shoulder retraction, cervical extension and rotation, weight bearing through UE    General Comments General comments (skin integrity, edema, etc.): VSS throughout      Pertinent Vitals/Pain Pain Assessment: Faces Faces Pain Scale: Hurts little more Pain Location: grimacing with bed mobility Pain Descriptors / Indicators: Discomfort;Grimacing Pain Intervention(s): Limited activity within patient's tolerance;Monitored during session;Repositioned           PT Goals (current goals can now  be found in the care plan section) Acute Rehab PT Goals Patient Stated Goal: Wife wanting him to return to PLOF PT Goal Formulation: With family Time For Goal Achievement: 09/09/19 Potential to Achieve Goals: Fair Progress towards PT goals: Progressing toward goals    Frequency    Min 3X/week      PT Plan Current plan remains appropriate       AM-PAC PT "6 Clicks" Mobility   Outcome Measure  Help needed  turning from your back to your side while in a flat bed without using bedrails?: Total Help needed moving from lying on your back to sitting on the side of a flat bed without using bedrails?: Total Help needed moving to and from a bed to a chair (including a wheelchair)?: Total Help needed standing up from a chair using your arms (e.g., wheelchair or bedside chair)?: Total Help needed to walk in hospital room?: Total Help needed climbing 3-5 steps with a railing? : Total 6 Click Score: 6    End of Session Equipment Utilized During Treatment: Oxygen Activity Tolerance: Patient limited by lethargy Patient left: in bed;with call bell/phone within reach;with bed alarm set;with family/visitor present Nurse Communication: Mobility status PT Visit Diagnosis: Unsteadiness on feet (R26.81);Other abnormalities of gait and mobility (R26.89);Muscle weakness (generalized) (M62.81);Difficulty in walking, not elsewhere classified (R26.2)     Time: 9574-7340 PT Time Calculation (min) (ACUTE ONLY): 31 min  Charges:  $Therapeutic Exercise: 8-22 mins $Therapeutic Activity: 8-22 mins                     Gem Conkle B. Migdalia Dk PT, DPT Acute Rehabilitation Services Pager [phone] Office [phone]    [place] 09/04/2019, 5:58 PM

## 2019-09-04 NOTE — Progress Notes (Addendum)
NAME:  [name], MRN:  [id number], DOB:  [birth year], LOS: 51 ADMISSION DATE:  07/25/2019, CONSULTATION DATE:  10/3 REFERRING MD:  Nadara Mustard, CHIEF COMPLAINT:  Dyspnea   Brief History   68 y/o male diagnosed with COVID on 9/29 presented to the [facility] ED on 10/2 with dyspnea, hypoxemia requiring intubation.  Admitted to [facility], developed AKI requiring CRRT, pneumothorax requiring chest tube and had a large bronchopleural fistula treated for 10 days with a bronchial blocker.  Tracheostomy placed on 10/30.  To [facility] 11/1 for IHD.  Chest tube to suction, improvement in air leak.  Past Medical History  GERD  Significant Hospital Events   10/03 Admit to [facility] from [facility] ER, start decadron and remdesivir, given tociluzimab; prone positioning 10/04 convalescent plasma 10/05 stop prone positioning 10/06 convalescent plasma; prone positioning again 10/09 start ABx 10/10 MSSA bacteremia, CVL d/ced; ID consulted; increase OG tube outpt 10/11 vent weaning trial started 10/13 fever, pneumothorax, pig tail chest tube placed 10/14 worsening hypotension and renal fx >> consulted nephrology; worsening PTX >> replaced chest tube; GNR in sputum >> ABx changed 10/15 start CRRT; endobronchial blocker placed for persistent air leak 10/16 endobronchial blocker repositioned, changed chest tube to water seal; melana with ABLA >> GI consulted; transfuse PRBC 10/17 persistent air leak, increased WOB; start nimbex gtt >> air leak decreased; EGD; A fib with RVR >> start amiodarone 10/18 transfuse PRBC; GI s/o 10/20 resume heparin gtt 10/21 stopped nimbex, chest tube to suction, stopped heparin drip because Hgb dropped again 10/22 TPA in chest tube, repositioned endobronchial blocker 10/23 deflated endobronchial balloon 10/24 small air leak  10/25 removed bronchial blocker 10/30 tracheostomy  11/1 D/C CRRT 11/2 Transfer to [facility] 11/3 Started on IHD 11/7 trach sutures removed 11/4 present, weaning on ATC 11/11 required  phenylephrine to tolerate iHD  Consults:  ID Nephrology Gastroenterology PCCM  Procedures:  10/2 R IJ CVL >> 10/10 10/13 Rt pig tail chest tube >> 10/14 10/14 R 62F chest tube >> 10/14 L IJ HD cath >> 11/10 10/30 tracheostomy >>  11/10 tunneled HD cath >>  Significant Diagnostic Tests:  10/2 CT angiogram chest >> extensive bilateral airspace disease predominantly posterior 10/8 doppler legs b/l >> no DVT 10/15 renal u/s >> normal 10/17 EGD >> non bleeding gastric ulcer 10/19 Echo >> EF 60 to 65%, hyperdynamic LV with small LVOT gradient 10/22 CT chest >> large collection of air in pleural space with fluid, pneumonia bilaterally R>L endobronchial blocker in place, chest tube anterior 10/31 CXR >> persistent small right pneumothorax despite chest in adequate position MRI brain 11/3 >> small acute to subacute infarcts involving bilateral cerebral cortex and right cerebellum.  Large b/l mastoid effusions  Micro Data:  9/20 SARS COV 2 >> POSITIVE 10/2 blood >> negative 10/9 blood >> MSSA 2/4 10/9 resp >> MSSA, Pseudomonas 10/13 blood >> negative 10/21 bronch wash bacterial >> pseduomonas, acinetobacter baumannii 10/21 bronch wash fungal >  10/21 bronch wash aspergillus antigen> negative 10/24 bronchoscopy wash> pseudomonas 10/28 Blood cx>>> negative  10/28 Sputum cx>>> Pseudomonas 11/1 SARS COV 2 > negative 11/2 SARS CoV2 >> negative   Antimicrobials:  10/3 remdesivir > 10/6 10/3 actemra 10/3 decadron > 10/12 10/4 convalescent plasma 10/6 convalescent plasma  10/9 vancomycin > 10/10 10/9 cefepime > 10/10 10/10 ancef > 10/13 10/13 cefepime >10/20 10/14 anidulofungin > 10/15  10/20 ancef > 10/23 10/23 meropenem > >>>off  10/21 anidulofungin> 10/22 10/22 voriconazole > 10/23 10/28 Merrem >>11/2 10/28 Vanc >>10/30  10/31  Ancef >> Tentative stop date 11/23   Interim history/subjective:  Required phenylephrine 11/11 to tolerate I HD Severely deconditioned No  air leak this am on suction   Objective   Blood pressure (!) 152/83, pulse (!) 121, temperature 99.1 F (37.3 C), temperature source Oral, resp. rate (!) 23, height 5\' 10"  (1.778 m), weight 84 kg, SpO2 93 %.    FiO2 (%):  [28 %] 28 %   Intake/Output Summary (Last 24 hours) at 09/04/2019 0757 Last data filed at 09/04/2019 0600 Gross per 24 hour  Intake 1090 ml  Output 400 ml  Net 690 ml   Filed Weights   09/02/19 1230 09/02/19 1240 09/02/19 1635  Weight: 83 kg 83 kg 84 kg    Examination: General: Chronically ill-appearing man, lying in bed in no distress. HEENT: Trach in place, some tan secretions that he is able to completely clear.  Oropharynx otherwise clear Neuro: Wakes to voice, nods to questions, globally weak.  Followed commands on the right but not on the left CV: Tachycardic, regular, no murmur PULM: Coarse bilaterally, no wheeze, chest tube -20 cmH2O suction without an air leak GI: Positive bowel sounds, mild distention, nontender Extremities: No edema Skin: No rash  Assessment & Plan:   Acute respiratory failure with hypoxemia - 2/2 COVID-19 (s/p remdsevir, actemra, convalescent plasma, steroids)  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  Now with persistent airleak / BPF despite endobronchial blocker placement (removed 10/25 due to MSSA bacteremia) P: Continue ATC ad lib. Should be able to start to work with SLP, consider PMV.  He is severely deconditioned, unclear when he will actually able be able to phonate or tolerate p.o. I do not see an air leak on chest tube 11/12, note plans for possible waterseal today.  Appreciate T CTS management.  If pneumothorax recurs then he may be a candidate for endobronchial valve  ESRD now on iHD P: Tolerating intermittent HD.  Required low-dose phenylephrine 11/11 during treatment Appreciate nephrology management  Bilateral cerebral cortex and right cerebellum small acute to subacute  infarcts Neuromuscular weakness/encephalopathy Persistent shock/vasoplegia -improved P: Has intermittently required pressors, required 11/11 with HD.  SBP goal 120-140 Continue to push PT/OT  MSSA bacteremia -TTE negative.  P: Committed to Ancef for 6 weeks total, stop date 11/23  Anemia of chronic disease with GI bleed and gastric ulcer  PPI now decreased to qd Follow CBC Follow for any evidence of active blood loss  Rest per primary team   Best practice:  Diet: tube feeding Pain/Anxiety/Delirium protocol (if indicated): fentanyl for pain. VAP protocol (if indicated): yes DVT prophylaxis: subcutaneous heparin GI prophylaxis: Pantoprazole for stress ulcer prophylaxis Glucose control: SSI Mobility: PT  Code Status: DNR if arrests Family Communication:  Disposition: ICU pending pressor liberation   Critical care :  Independent CC time 31 minutes   Signature:   Baltazar Apo, MD, PhD 09/04/2019, 8:10 AM [facility] [phone] or if no answer [phone]

## 2019-09-04 NOTE — Progress Notes (Signed)
[facility] Progress Note  Subjective: seen in room  Vitals:   09/04/19 1100 09/04/19 1130 09/04/19 1145 09/04/19 1207  BP: 104/60 109/67 116/65   Pulse: (!) 118 (!) 110 (!) 114 (!) 117  Resp: (!) 24 (!) 23 (!) 25 (!) 25  Temp:   99.2 F (37.3 C)   TempSrc:   Oral   SpO2: 100% 100% 100% 100%  Weight:      Height:        Inpatient medications: . aspirin  325 mg Per Tube Daily  . atorvastatin  40 mg Oral q1800  . B-complex with vitamin C  1 tablet Per Tube Daily  . chlorhexidine  15 mL Mouth/Throat BID  . Chlorhexidine Gluconate Cloth  6 each Topical Daily  . Chlorhexidine Gluconate Cloth  6 each Topical Q0600  . Chlorhexidine Gluconate Cloth  6 each Topical Q0600  . darbepoetin (ARANESP) injection - NON-DIALYSIS  100 mcg Subcutaneous Q Wed-1800  . feeding supplement (PRO-STAT SUGAR FREE 64)  30 mL Per Tube QID  . Gerhardt's butt cream   Topical TID  . heparin injection (subcutaneous)  5,000 Units Subcutaneous Q8H  . mouth rinse  15 mL Mouth Rinse 10 times per day  . midodrine  10 mg Per Tube TID WC  . pantoprazole sodium  40 mg Per Tube Daily  . sodium zirconium cyclosilicate  10 g Oral Daily  . sucralfate  1 g Oral Q6H  . white petrolatum       . sodium chloride Stopped (09/01/19 0000)  .  ceFAZolin (ANCEF) IV 1 g (09/03/19 2301)  . dextrose Stopped (08/16/19 1623)  . feeding supplement (NEPRO CARB STEADY) 45 mL/hr at 09/04/19 0600   sodium chloride, acetaminophen (TYLENOL) oral liquid 160 mg/5 mL, dextrose, heparin, heparin, heparin, heparin, [START ON 09/05/2019] heparin, metoprolol tartrate, pneumococcal 23 valent vaccine, polyethylene glycol   Exam:   Trach collar, L hemiparesis   L IJ temp cath   No jvd   Chest clear ant / lat, R chest tube in place    Cor reg no RG     Abd soft ntnd no ascites     Ext 2+ bilat LUE edema, no LE edema    HD here - 3.5 - 4h , ~ 0- 1L goals, hep yes   Assessment/Plan:  AKI/CKD stage 3- in setting of covid PNA  and has remained oliguric/anuric. Started on CVVHD 08/07/19-08/24/19. Came off pressors and transferred to [facility] for iHD  HD TTS for now. HD today  Off pressors  Did not tolerate UF on 11/10, min UF today  COVID PNA- treated with remdesivir, dexamethasone, and convalescent plasma. Most recent covid test negative x 2 on 08/24/19 and 08/25/19  VDRF - off vent, on trach collar per PCCM  Sepsis/shock- better, on midodrine 10 tid, pressors prn  Right pneumothorax/emphyema/necrotic RLL- s/p chest tube with air leak. Per CT Surgery he is not a candidate for VATS  New cerebral and cerebellar strokes- per primary svc  A fib/RVR- per PCCM  GIB- acute s/p EGD which showed gastric ulcer with stigmata of recent bleeding s/p injection and clipping. Able to tolerate low dose heparin with CVVHD before stopping it  MSSA bacteremia- treated  Acinetobacter and pseudomonal pna- sp abx course   Rob Charon Akamine 09/04/2019, 12:42 PM  Iron/TIBC/Ferritin/ %Sat    Component Value Date/Time   IRON 71 08/20/2019 1643   TIBC 217 (L) 08/20/2019 1643   FERRITIN 681 (H) 08/06/2019 0610   IRONPCTSAT  33 08/20/2019 1643   Recent Labs  Lab 08/30/19 0553  09/02/19 0649  09/04/19 0730  NA 136   < >  --    < > 135  K 5.7*   < >  --    < > 4.5  CL 98   < >  --    < > 94*  CO2 21*   < >  --    < > 23  GLUCOSE 121*   < >  --    < > 103*  BUN 136*   < >  --    < > 112*  CREATININE 6.09*   < >  --    < > 5.76*  CALCIUM 8.8*   < >  --    < > 8.7*  PHOS 9.7*  --   --   --   --   INR  --   --  1.2  --   --    < > = values in this interval not displayed.   No results for input(s): AST, ALT, ALKPHOS, BILITOT, PROT in the last 168 hours. Recent Labs  Lab 09/04/19 0730  WBC 12.1*  HGB 7.4*  HCT 23.3*  PLT 331

## 2019-09-05 ENCOUNTER — Inpatient Hospital Stay (HOSPITAL_COMMUNITY): Payer: Medicare HMO

## 2019-09-05 DIAGNOSIS — J1289 Other viral pneumonia: Secondary | ICD-10-CM | POA: Diagnosis not present

## 2019-09-05 DIAGNOSIS — I6330 Cerebral infarction due to thrombosis of unspecified cerebral artery: Secondary | ICD-10-CM | POA: Diagnosis not present

## 2019-09-05 DIAGNOSIS — Z9689 Presence of other specified functional implants: Secondary | ICD-10-CM | POA: Diagnosis not present

## 2019-09-05 DIAGNOSIS — J9311 Primary spontaneous pneumothorax: Secondary | ICD-10-CM | POA: Diagnosis not present

## 2019-09-05 DIAGNOSIS — N186 End stage renal disease: Secondary | ICD-10-CM

## 2019-09-05 DIAGNOSIS — Z7189 Other specified counseling: Secondary | ICD-10-CM | POA: Diagnosis not present

## 2019-09-05 DIAGNOSIS — U071 COVID-19: Secondary | ICD-10-CM | POA: Diagnosis not present

## 2019-09-05 DIAGNOSIS — J9600 Acute respiratory failure, unspecified whether with hypoxia or hypercapnia: Secondary | ICD-10-CM | POA: Diagnosis not present

## 2019-09-05 DIAGNOSIS — J9601 Acute respiratory failure with hypoxia: Secondary | ICD-10-CM | POA: Diagnosis not present

## 2019-09-05 DIAGNOSIS — R000 Tachycardia, unspecified: Secondary | ICD-10-CM

## 2019-09-05 DIAGNOSIS — Z515 Encounter for palliative care: Secondary | ICD-10-CM | POA: Diagnosis not present

## 2019-09-05 LAB — CBC
HCT: 21.8 % — ABNORMAL LOW (ref 39.0–52.0)
HCT: 26.1 % — ABNORMAL LOW (ref 39.0–52.0)
Hemoglobin: 6.8 g/dL — CL (ref 13.0–17.0)
Hemoglobin: 8.4 g/dL — ABNORMAL LOW (ref 13.0–17.0)
MCH: 28.7 pg (ref 26.0–34.0)
MCH: 29.2 pg (ref 26.0–34.0)
MCHC: 31.2 g/dL (ref 30.0–36.0)
MCHC: 32.2 g/dL (ref 30.0–36.0)
MCV: 92 fL (ref 80.0–100.0)
MCV: 90.6 fL (ref 80.0–100.0)
Platelets: 309 10*3/uL (ref 150–400)
Platelets: 290 10*3/uL (ref 150–400)
RBC: 2.37 MIL/uL — ABNORMAL LOW (ref 4.22–5.81)
RBC: 2.88 MIL/uL — ABNORMAL LOW (ref 4.22–5.81)
RDW: 15.5 % (ref 11.5–15.5)
RDW: 15.9 % — ABNORMAL HIGH (ref 11.5–15.5)
WBC: 14 10*3/uL — ABNORMAL HIGH (ref 4.0–10.5)
WBC: 13.3 10*3/uL — ABNORMAL HIGH (ref 4.0–10.5)
nRBC: 0.1 % (ref 0.0–0.2)
nRBC: 0.3 % — ABNORMAL HIGH (ref 0.0–0.2)

## 2019-09-05 LAB — GLUCOSE, CAPILLARY
Glucose-Capillary: 112 mg/dL — ABNORMAL HIGH (ref 70–99)
Glucose-Capillary: 110 mg/dL — ABNORMAL HIGH (ref 70–99)
Glucose-Capillary: 114 mg/dL — ABNORMAL HIGH (ref 70–99)
Glucose-Capillary: 89 mg/dL (ref 70–99)
Glucose-Capillary: 99 mg/dL (ref 70–99)

## 2019-09-05 LAB — BASIC METABOLIC PANEL
Anion gap: 15 (ref 5–15)
BUN: 80 mg/dL — ABNORMAL HIGH (ref 8–23)
CO2: 26 mmol/L (ref 22–32)
Calcium: 8.7 mg/dL — ABNORMAL LOW (ref 8.9–10.3)
Chloride: 93 mmol/L — ABNORMAL LOW (ref 98–111)
Creatinine, Ser: 4.44 mg/dL — ABNORMAL HIGH (ref 0.61–1.24)
GFR calc Af Amer: 15 mL/min — ABNORMAL LOW (ref >60)
GFR calc non Af Amer: 13 mL/min — ABNORMAL LOW (ref >60)
Glucose, Bld: 100 mg/dL — ABNORMAL HIGH (ref 70–99)
Potassium: 3.9 mmol/L (ref 3.5–5.1)
Sodium: 134 mmol/L — ABNORMAL LOW (ref 135–145)

## 2019-09-05 LAB — PREPARE RBC (CROSSMATCH)

## 2019-09-05 MED ORDER — SODIUM CHLORIDE 0.9% IV SOLUTION
Freq: Once | INTRAVENOUS | Status: AC
  Administered 2019-09-05: 13:00:00 via INTRAVENOUS

## 2019-09-05 MED ORDER — CHLORHEXIDINE GLUCONATE CLOTH 2 % EX PADS
6.0000 | MEDICATED_PAD | Freq: Every day | CUTANEOUS | Status: DC
  Administered 2019-09-06: 6 via TOPICAL

## 2019-09-05 NOTE — Progress Notes (Signed)
Daily Progress Note   Patient Name: [name]       Date: 09/05/2019 DOB: May 12, 1951  Age: 68 y.o. MRN#: [id number] Attending Physician: Desiree Hane, MD Primary Care Physician: Kathyrn Drown, MD Admit Date: 07/25/2019  Reason for Consultation/Follow-up: Establishing goals of care  Subjective: Patient in bed- appears more comfortable today with less secretions. Noted WBC trending up. SLP note was more interactive and communicative today, however, did not respond to my voice or touch.  I called [name] to followup from our discussion yesterday. Left message requesting return call.   Review of Systems  Unable to perform ROS: Medical condition    Length of Stay: 42  Current Medications: Scheduled Meds:  . aspirin  325 mg Per Tube Daily  . atorvastatin  40 mg Oral q1800  . B-complex with vitamin C  1 tablet Per Tube Daily  . chlorhexidine  15 mL Mouth/Throat BID  . Chlorhexidine Gluconate Cloth  6 each Topical Daily  . Chlorhexidine Gluconate Cloth  6 each Topical Q0600  . Chlorhexidine Gluconate Cloth  6 each Topical Q0600  . darbepoetin (ARANESP) injection - NON-DIALYSIS  100 mcg Subcutaneous Q Wed-1800  . feeding supplement (PRO-STAT SUGAR FREE 64)  30 mL Per Tube QID  . Gerhardt's butt cream   Topical TID  . heparin injection (subcutaneous)  5,000 Units Subcutaneous Q8H  . mouth rinse  15 mL Mouth Rinse 10 times per day  . midodrine  10 mg Per Tube TID WC  . pantoprazole sodium  40 mg Per Tube Daily  . sucralfate  1 g Oral Q6H    Continuous Infusions: . sodium chloride Stopped (09/01/19 0000)  .  ceFAZolin (ANCEF) IV Stopped (09/04/19 2326)  . dextrose Stopped (08/16/19 1623)  . feeding supplement (NEPRO CARB STEADY) 45 mL/hr at 09/05/19 0600    PRN Meds: sodium  chloride, acetaminophen (TYLENOL) oral liquid 160 mg/5 mL, dextrose, heparin, heparin, heparin, heparin, heparin, metoprolol tartrate, pneumococcal 23 valent vaccine, polyethylene glycol  Physical Exam Vitals signs and nursing note reviewed.  Constitutional:      Appearance: He is ill-appearing.  Pulmonary:     Comments: Trach collar Skin:    General: Skin is warm and dry.  Neurological:     Comments: Not responsive to my voice or touch  Vital Signs: BP 139/73   Pulse 86   Temp 99 F (37.2 C) (Oral)   Resp (!) 23   Ht 5\' 10"  (1.778 m)   Wt 84 kg   SpO2 99%   BMI 26.57 kg/m  SpO2: SpO2: 99 % O2 Device: O2 Device: Tracheostomy Collar O2 Flow Rate: O2 Flow Rate (L/min): 5 L/min  Intake/output summary:   Intake/Output Summary (Last 24 hours) at 09/05/2019 1516 Last data filed at 09/05/2019 1400 Gross per 24 hour  Intake 1255 ml  Output 150 ml  Net 1105 ml   LBM: Last BM Date: 09/05/19 Baseline Weight: Weight: 96 kg Most recent weight: Weight: 84 kg       Palliative Assessment/Data: PPS: 10%     Patient Active Problem List   Diagnosis Date Noted  . Sinus tachycardia 09/05/2019  . Advanced care planning/counseling discussion   . Goals of care, counseling/discussion   . Palliative care by specialist   . Cerebral thrombosis with cerebral infarction 08/28/2019  . Tracheostomy status ([place])   . Acute respiratory distress syndrome (ARDS) due to COVID-19 virus ([place])   . Chest tube in place   . Primary spontaneous pneumothorax   . Acute respiratory failure ([place])   . Gastric ulcer with hemorrhage 08/10/2019  . Constipation 08/09/2019  . Atrial fibrillation with RVR ([place]) 08/07/2019  . Acute renal failure (ARF) ([place]) 08/06/2019  . GI bleed 08/06/2019  . Pneumothorax on right   . Fluid overload 07/27/2019  . Pneumonia due to COVID-19 virus 07/25/2019  . Acute respiratory failure due to COVID-19 ([place]) 07/25/2019  . AKI (acute kidney injury) ([place])  07/25/2019  . Dyspepsia 11/26/2014  . BPH (benign prostatic hyperplasia) 10/31/2013  . Achilles rupture 04/05/2011  . S/P Achilles tendon repair 04/05/2011    Palliative Care Assessment & Plan   Patient Profile: 68 y.o.malewith a medical history of GERD. He was admitted initially on 07/25/19 at [facility] (transferred to [facility]) with complaints of shortness of breath after being diagnosed with COVID on 07/13/19. He completed his treatment course of remdesivir, decadron, and convalescent plasma in addition to requiring intubation for respiratory support and IV antibiotics for possible pneumonia. He was found to have MSSA bacteremia on 08/02/19 and started on Ancef. During his hospital course he developed a pneumothorax requiring pig tail chest tube (10/13), developed worsening hypotension and renal failure (10/14) started on CRRT (10/15) later transitioning to iHD (11/3). Mr. Dentler required a tracheostomy on 10/30 and have been able to wean on ATC (11/4). MRI on 11/3 showed small acute to subacute infarcts involving cerebral cortex and right cerebellum and large bilateral mastoid effusions. He remains on pressor support. Palliative Medicine team consulted for goals of care.  Assessment/Recommendations/Plan   Continue current scope- PMT will continue to monitor and discuss GOC with family  Goals of Care and Additional Recommendations:  Limitations on Scope of Treatment: Full Scope Treatment  Code Status:  DNR  Prognosis:   Unable to determine  Discharge Planning:  To Be Determined  Care plan was discussed with - attempted to call patient's spouse- left message.   Thank you for allowing the Palliative Medicine Team to assist in the care of this patient.   Time In: 1500 Time Out: 1525 Total Time 25 mins Prolonged Time Billed no      Greater than 50%  of this time was spent counseling and coordinating care related to the above assessment and plan.  Mariana Kaufman, AGNP-C Palliative Medicine    Please contact  Palliative Medicine Team phone at [phone] for questions and concerns.

## 2019-09-05 NOTE — Progress Notes (Signed)
CRITICAL VALUE ALERT  Critical Value:  Hemoglobin 6.8  Date & Time Notied:  09/05/2019 10:15  Provider Notified: Dr. Oretha Milch  Orders Received/Actions taken: transfuse PRBC

## 2019-09-05 NOTE — Progress Notes (Signed)
[facility]  PROGRESS NOTE  Harlee Pursifull LGX:[id number] DOB: 1950-11-05 DOA: 07/25/2019 PCP: Kathyrn Drown, MD Admit date - 07/25/2019   Admitting Physician Kristopher Oppenheim, DO  Outpatient Primary MD for the patient is Kathyrn Drown, MD  LOS - 38 Brief Narrative   Hayzen Lorenson is a 68 y.o. year old male with medical history significant for GERD who presented on 07/25/2019 as a transfer from [facility], ED with dyspnea found to have multifocal infiltrates on CT chet, hypoxia requiring intubation and history admission to [facility] for Covid pneumonia.    Prolonged Hospital course complicated by CXKGY-18 pneumonia, ARDS, AKI requiring CRRT, pneumothorax of bronchopleural fistula requiring Pleur-evac, prolonged mechanical ventilation status post tracheostomy on 10/30, MSSA bacteremia, intermittent pressor support required with dialysis  Subjective  Mr. Melhorn today had no acute events. His wife is at bedside. He is sleeping comfortably  A & P    1. Acute hypoxic respiratory failure secondary to Covid pneumonia.  Completed remdesivir/Decadron therapy.  Covid test negative x2 (11/1, 11/2).  On trach collar per PCCM chronically requiring 5 L maintaining normal oxygen saturation. 2. AKI on CKD Stage 3( baseline cr around 1.5) requiring intermittent HD likely ATN related to septic shock.  CRRT DC'd 11/1, dialysis (started 11/2) per nephrology 3. Septic shock, resolved, persistent hypotension.  Requiring as needed pressors during dialysis, continue midodrine 10 mg 3 times daily 4. Right pneumothorax/empyema/necrotic right lower lobe/bronchopleural fistula.  Chest tube in place, CT surgery following, placed to waterseal, closely monitor, serial cxr before removing chest tube likely next week per PCCM  5. Acute on chronic anemia, worsened in setting of GI bleed/gastric ulcer status post EGD with culprit vessel and large ulcer.  Hemoglobin 6.8 will give 1 U PRBC, only minimal bloody from trach site,  closely monitor.  Continue PPI twice daily 6. New cerebral/cerebellar strokes likely due to hypotension secondary to dialysis and sepsis and deconditioning. PAF is also a potential etiology.  Neurology recommends aspirin, permissive hypertension (goal BP 120-140), not for anticoagulation given history of GI bleed 7. Atrial fibrillation, New onset, intermittently tachycardic110-120s but this is sinus based on EKD to  unable to anticoagulate due to recent GI bleeding, continue aspirin, will add IV PRN BB, avoid long acting BB given sensitive BP. 8. Sinus tachycardia. Could be from low hgb. Giving blood now, will monitor to make sure no active bleed.  9. MSSA bacteremia, continue cefazolin, stop date 11/23 (6 total weeks), TTE negative but still high concern for infective endocarditis  10. Pseudomonas and Acinetobacter NP BAL specimen, resolved.  Completed 10-day treatment of meropenem Pressure injuries, not present on admission.  Pressure Injury 08/16/19 Lip Medial;Lower Stage I -  Intact skin with non-blanchable redness of a localized area usually over a bony prominence. healing  (Active)  08/16/19 2000  Location: Lip  Location Orientation: Medial;Lower  Staging: Stage I -  Intact skin with non-blanchable redness of a localized area usually over a bony prominence.  Wound Description (Comments): healing   Present on Admission: No     Pressure Injury 08/24/19 Sacrum Right;Left;Mid Stage II -  Partial thickness loss of dermis presenting as a shallow open ulcer with a red, pink wound bed without slough.  (Active)  08/24/19 0800  Location: Sacrum  Location Orientation: Right;Left;Mid  Staging: Stage II -  Partial thickness loss of dermis presenting as a shallow open ulcer with a red, pink wound bed without slough.  Wound Description (Comments): -- (stage 11 mid sacrum)  Present  on Admission: No    11. Goals of care, severely guarded prognosis.  Patient family currently elected to continue full  scope of medical treatment, but wife will discuss with family as they are considering comfort care, appreciate palliative care team assistance.     Family Communication  : Spoke with His wife Mrs. Azzarello at bedside  Code Status : DNR  Disposition Plan  : Guarded prognosis, continue medical management, goals of care discussion,  monitoring chest tube, giving blood  Consults  : ID, nephrology, GI, PCCM, Palliative  Procedures  : Tunneled HD cath, 11/10 Tracheostomy 10/30 Right-sided chest tube, 10/14 Right pigtail chest tube 10/13-2/14 Right IJ 10/2-10/10   DVT Prophylaxis  : SCDs  Lab Results  Component Value Date   PLT 309 09/05/2019    Diet :  Diet Order            Diet NPO time specified  Diet effective midnight               Inpatient Medications Scheduled Meds:  sodium chloride   Intravenous Once   aspirin  325 mg Per Tube Daily   atorvastatin  40 mg Oral q1800   B-complex with vitamin C  1 tablet Per Tube Daily   chlorhexidine  15 mL Mouth/Throat BID   Chlorhexidine Gluconate Cloth  6 each Topical Daily   Chlorhexidine Gluconate Cloth  6 each Topical Q0600   Chlorhexidine Gluconate Cloth  6 each Topical Q0600   darbepoetin (ARANESP) injection - NON-DIALYSIS  100 mcg Subcutaneous Q Wed-1800   feeding supplement (PRO-STAT SUGAR FREE 64)  30 mL Per Tube QID   Gerhardt's butt cream   Topical TID   heparin injection (subcutaneous)  5,000 Units Subcutaneous Q8H   mouth rinse  15 mL Mouth Rinse 10 times per day   midodrine  10 mg Per Tube TID WC   pantoprazole sodium  40 mg Per Tube Daily   sodium zirconium cyclosilicate  10 g Oral Daily   sucralfate  1 g Oral Q6H   Continuous Infusions:  sodium chloride Stopped (09/01/19 0000)    ceFAZolin (ANCEF) IV Stopped (09/04/19 2326)   dextrose Stopped (08/16/19 1623)   feeding supplement (NEPRO CARB STEADY) 45 mL/hr at 09/05/19 0600   PRN Meds:.sodium chloride, acetaminophen (TYLENOL) oral  liquid 160 mg/5 mL, dextrose, heparin, heparin, heparin, heparin, heparin, metoprolol tartrate, pneumococcal 23 valent vaccine, polyethylene glycol  Antibiotics  :   Anti-infectives (From admission, onward)   Start     Dose/Rate Route Frequency Ordered Stop   08/26/19 2200  ceFAZolin (ANCEF) IVPB 1 g/50 mL premix     1 g 100 mL/hr over 30 Minutes Intravenous Every 24 hours 08/24/19 1021 09/15/19 2359   08/24/19 1100  meropenem (MERREM) 1 g in sodium chloride 0.9 % 100 mL IVPB     1 g 200 mL/hr over 30 Minutes Intravenous Every 24 hours 08/24/19 1021 08/25/19 1156   08/23/19 1100  ceFAZolin (ANCEF) IVPB 2g/100 mL premix  Status:  Discontinued     2 g 200 mL/hr over 30 Minutes Intravenous Every 12 hours 08/23/19 1048 08/24/19 1021   08/21/19 1700  vancomycin (VANCOCIN) IVPB 750 mg/150 ml premix  Status:  Discontinued     750 mg 150 mL/hr over 60 Minutes Intravenous Every 24 hours 08/21/19 0843 08/23/19 1048   08/21/19 1400  vancomycin (VANCOCIN) IVPB 750 mg/150 ml premix  Status:  Discontinued     750 mg 150 mL/hr over 60 Minutes Intravenous  Every 24 hours 08/20/19 1146 08/20/19 1218   08/21/19 1000  meropenem (MERREM) 1 g in sodium chloride 0.9 % 100 mL IVPB  Status:  Discontinued     1 g 200 mL/hr over 30 Minutes Intravenous Every 12 hours 08/20/19 1535 08/23/19 1048   08/21/19 0413  meropenem (MERREM) 500 mg in sodium chloride 0.9 % 100 mL IVPB  Status:  Discontinued     500 mg 200 mL/hr over 30 Minutes Intravenous Every 24 hours 08/20/19 1245 08/20/19 1535   08/20/19 1330  vancomycin (VANCOCIN) 1,750 mg in sodium chloride 0.9 % 500 mL IVPB     1,750 mg 250 mL/hr over 120 Minutes Intravenous  Once 08/20/19 1222 08/20/19 1619   08/20/19 1300  vancomycin (VANCOCIN) 1,250 mg in sodium chloride 0.9 % 250 mL IVPB  Status:  Discontinued     1,250 mg 166.7 mL/hr over 90 Minutes Intravenous  Once 08/20/19 1146 08/20/19 1218   08/20/19 1224  vancomycin variable dose per unstable renal  function (pharmacist dosing)  Status:  Discontinued      Does not apply See admin instructions 08/20/19 1225 08/21/19 1152   08/15/19 1600  meropenem (MERREM) 1 g in sodium chloride 0.9 % 100 mL IVPB  Status:  Discontinued     1 g 200 mL/hr over 30 Minutes Intravenous Every 12 hours 08/15/19 1543 08/20/19 1245   08/15/19 1000  voriconazole (VFEND) tablet 200 mg  Status:  Discontinued     200 mg Per Tube Every 12 hours 08/14/19 1148 08/15/19 1134   08/14/19 1600  anidulafungin (ERAXIS) 100 mg in sodium chloride 0.9 % 100 mL IVPB  Status:  Discontinued     100 mg 78 mL/hr over 100 Minutes Intravenous Every 24 hours 08/13/19 1645 08/14/19 1148   08/14/19 1300  voriconazole (VFEND) tablet 400 mg     400 mg Per Tube Every 12 hours 08/14/19 1148 08/14/19 2217   08/13/19 1700  anidulafungin (ERAXIS) 200 mg in sodium chloride 0.9 % 200 mL IVPB     200 mg 78 mL/hr over 200 Minutes Intravenous  Once 08/13/19 1645 08/13/19 2030   08/13/19 0200  ceFAZolin (ANCEF) IVPB 2g/100 mL premix  Status:  Discontinued     2 g 200 mL/hr over 30 Minutes Intravenous Every 12 hours 08/11/19 1512 08/15/19 1543   08/12/19 0200  ceFEPIme (MAXIPIME) 2 g in sodium chloride 0.9 % 100 mL IVPB     2 g 200 mL/hr over 30 Minutes Intravenous Every 12 hours 08/11/19 1509 08/12/19 1341   08/07/19 2200  ceFEPIme (MAXIPIME) 2 g in sodium chloride 0.9 % 100 mL IVPB  Status:  Discontinued     2 g 200 mL/hr over 30 Minutes Intravenous Every 12 hours 08/07/19 1540 08/11/19 1509   08/07/19 1000  anidulafungin (ERAXIS) 100 mg in sodium chloride 0.9 % 100 mL IVPB  Status:  Discontinued     100 mg 78 mL/hr over 100 Minutes Intravenous Every 24 hours 08/06/19 0934 08/08/19 1203   08/07/19 0800  ceFEPIme (MAXIPIME) 2 g in sodium chloride 0.9 % 100 mL IVPB  Status:  Discontinued     2 g 200 mL/hr over 30 Minutes Intravenous Every 24 hours 08/06/19 1036 08/07/19 1540   08/06/19 1000  anidulafungin (ERAXIS) 200 mg in sodium chloride 0.9 %  200 mL IVPB    Note to Pharmacy: please   200 mg 78 mL/hr over 200 Minutes Intravenous Every 24 hours 08/06/19 0925 08/06/19 1600   08/06/19 0800  ceFEPIme (MAXIPIME) 2 g in sodium chloride 0.9 % 100 mL IVPB  Status:  Discontinued     2 g 200 mL/hr over 30 Minutes Intravenous Every 12 hours 08/06/19 0733 08/06/19 1036   08/02/19 2200  ceFAZolin (ANCEF) IVPB 2g/100 mL premix  Status:  Discontinued     2 g 200 mL/hr over 30 Minutes Intravenous Every 8 hours 08/02/19 1338 08/06/19 0733   08/02/19 1400  vancomycin (VANCOCIN) 1,250 mg in sodium chloride 0.9 % 250 mL IVPB  Status:  Discontinued     1,250 mg 166.7 mL/hr over 90 Minutes Intravenous Every 24 hours 08/01/19 1320 08/02/19 1332   08/01/19 1330  vancomycin (VANCOCIN) 2,000 mg in sodium chloride 0.9 % 500 mL IVPB     2,000 mg 250 mL/hr over 120 Minutes Intravenous  Once 08/01/19 1158 08/01/19 1636   08/01/19 1300  ceFEPIme (MAXIPIME) 2 g in sodium chloride 0.9 % 100 mL IVPB  Status:  Discontinued     2 g 200 mL/hr over 30 Minutes Intravenous Every 12 hours 08/01/19 1158 08/02/19 1332   07/26/19 1600  remdesivir 100 mg in sodium chloride 0.9 % 250 mL IVPB     100 mg 500 mL/hr over 30 Minutes Intravenous Every 24 hours 07/26/19 0132 07/29/19 1823   07/26/19 0215  remdesivir 200 mg in sodium chloride 0.9 % 250 mL IVPB     200 mg 500 mL/hr over 30 Minutes Intravenous Once 07/26/19 0132 07/26/19 0520       Objective   Vitals:   09/05/19 0728 09/05/19 0740 09/05/19 0800 09/05/19 0900  BP:   115/70 (!) 160/85  Pulse: (!) 110  (!) 110 99  Resp: (!) 28  (!) 26 (!) 26  Temp:  99.2 F (37.3 C)    TempSrc:  Oral    SpO2: 95%  96% 99%  Weight:      Height:        SpO2: 99 % O2 Flow Rate (L/min): 5 L/min FiO2 (%): 28 %  Wt Readings from Last 3 Encounters:  09/02/19 84 kg  11/08/17 96.2 kg  09/04/16 94.3 kg     Intake/Output Summary (Last 24 hours) at 09/05/2019 1023 Last data filed at 09/05/2019 0800 Gross per 24 hour   Intake 1155 ml  Output 225 ml  Net 930 ml    Physical Exam:  Awake, opens eyes to voice, nods head to all questions Trach collar in place, 5 L, normal oxygen saturation with no respiratory distress Irregularly irregular rhythm, normal rate Rectal tube in place Boots in place on lower extremities No new F.N deficits,     I have personally reviewed the following:   Data Reviewed:  CBC Recent Labs  Lab 08/30/19 0553  09/02/19 0307 09/02/19 0420 09/03/19 0440 09/04/19 0730 09/05/19 0922  WBC 9.0   < > 8.8 9.4 8.7 12.1* 14.0*  HGB 7.2*   < > 7.4* 7.7* 7.6* 7.4* 6.8*  HCT 23.3*   < > 23.4* 24.3* 24.2* 23.3* 21.8*  PLT 361   < > 367 365 304 331 309  MCV 95.5   < > 92.1 91.7 91.3 91.0 92.0  MCH 29.5   < > 29.1 29.1 28.7 28.9 28.7  MCHC 30.9   < > 31.6 31.7 31.4 31.8 31.2  RDW 16.5*   < > 17.2* 17.1* 16.2* 15.6* 15.5  LYMPHSABS 0.8  --   --   --   --   --   --   MONOABS 0.6  --   --   --   --   --   --  EOSABS 0.5  --   --   --   --   --   --   BASOSABS 0.0  --   --   --   --   --   --    < > = values in this interval not displayed.    Chemistries  Recent Labs  Lab 08/30/19 0553  09/01/19 0356 09/02/19 0420 09/03/19 0440 09/04/19 0730 09/05/19 0922  NA 136   < > 134* 136 135 135 134*  K 5.7*   < > 5.0 5.9* 4.2 4.5 3.9  CL 98   < > 98 100 98 94* 93*  CO2 21*   < > 20* 19* _0 GLUCOSE 121*   < > 118* 117* 118* 103* 100*  BUN 136*   < > 145* 183* 75* 112* 80*  CREATININE 6.09*   < > 6.49* 7.44* 4.26* 5.76* 4.44*  CALCIUM 8.8*   < > 8.6* 8.9 8.3* 8.7* 8.7*  MG 2.9*  --   --   --   --   --   --    < > = values in this interval not displayed.   ------------------------------------------------------------------------------------------------------------------ No results for input(s): CHOL, HDL, LDLCALC, TRIG, CHOLHDL, LDLDIRECT in the last 72 hours.  Lab Results  Component Value Date   HGBA1C 6.1 (H) 07/26/2019    ------------------------------------------------------------------------------------------------------------------ No results for input(s): TSH, T4TOTAL, T3FREE, THYROIDAB in the last 72 hours.  Invalid input(s): FREET3 ------------------------------------------------------------------------------------------------------------------ No results for input(s): VITAMINB12, FOLATE, FERRITIN, TIBC, IRON, RETICCTPCT in the last 72 hours.  Coagulation profile Recent Labs  Lab 09/02/19 0649  INR 1.2    No results for input(s): DDIMER in the last 72 hours.  Cardiac Enzymes No results for input(s): CKMB, TROPONINI, MYOGLOBIN in the last 168 hours.  Invalid input(s): CK ------------------------------------------------------------------------------------------------------------------ No results found for: BNP  Micro Results No results found for this or any previous visit (from the past 240 hour(s)).  Radiology Reports Dg Abd 1 View  Result Date: 08/09/2019 CLINICAL DATA:  ARDS from COVID-19. EXAM: ABDOMEN - 1 VIEW COMPARISON:  August 06, 2019 FINDINGS: No free air, portal venous gas, or pneumatosis identified on supine imaging. An NG tube terminates in the stomach. Foreign body projects over the midline of the lower pelvis. No other abnormalities. IMPRESSION: 1. The foreign body projecting over the pelvis may be on or in the patient. Recommend clinical correlation. 2. No other acute abnormalities. Electronically Signed   By: Dorise Bullion III M.D   On: 08/09/2019 18:50   Ct Chest Wo Contrast  Result Date: 09/01/2019 CLINICAL DATA:  Coronavirus infection. Chest tube placement for right pneumothorax. EXAM: CT CHEST WITHOUT CONTRAST TECHNIQUE: Multidetector CT imaging of the chest was performed following the standard protocol without IV contrast. COMPARISON:  Chest radiography same day. CT chest 08/13/2019 FINDINGS: Cardiovascular: Heart size upper limits of normal. Minimal aortic  atherosclerosis. No acute vascular finding visible. Mediastinum/Nodes: No mass or lymphadenopathy. Lungs/Pleura: Tracheostomy remains in place. Widespread patchy pulmonary infiltrates in the left lung persist, improved since late October. Better aeration in the left lower lobe. On the right, chest tube remains in place anteriorly, in direct communication with pleural air. Total amount of pleural air estimated at 5-10%. No evidence that this is increasing. Widespread patchy pulmonary infiltrates as seen previously, with a large air cyst in the right lower lung measuring 9.5 x 7.7 x 7.1 cm. I think this is in the right middle lobe, but find it difficult  to state that with certainty. Some chance this could be in the right lower lobe, less likely. In any case, this is likely subsequent to barotrauma. Upper Abdomen: Negative Musculoskeletal: Negative IMPRESSION: 1. Right chest tube remains in place anteriorly, in direct communication with pleural air. Total amount of pleural air estimated at 5-10%. 2. Widespread patchy pulmonary infiltrates, improved since late October. Better aeration in the left lower lobe. 3. Large air cyst in the right lower lung measuring 9.5 x 7.7 x 7.1 cm. This is presumably subsequent to barotrauma. This is slightly larger than on the study of 08/13/2019. 4. Tracheostomy remains in place. Aortic Atherosclerosis (ICD10-I70.0). Electronically Signed   By: Nelson Chimes M.D.   On: 09/01/2019 15:53   Ct Chest Wo Contrast  Result Date: 08/13/2019 CLINICAL DATA:  COVID-19 positivity, follow-up patchy infiltrates EXAM: CT CHEST WITHOUT CONTRAST TECHNIQUE: Multidetector CT imaging of the chest was performed following the standard protocol without IV contrast. COMPARISON:  07/25/2019 FINDINGS: Cardiovascular: Examination is somewhat limited due to lack of IV contrast. Atherosclerotic calcifications of the thoracic aorta are seen. No cardiac enlargement is noted. No aneurysmal dilatation is seen.  Right-sided central line is noted extending into the cavoatrial junction. Left jugular central line is noted as well. Mediastinum/Nodes: Endotracheal tube and gastric catheter are seen. A blocking catheter is noted within the right lower lobe bronchi stable from prior exams. Thoracic inlet appears within normal limits. No sizable mediastinal or hilar adenopathy is noted. The esophagus is within normal limits as visualized. Lungs/Pleura: The lungs demonstrate patchy infiltrates similar to that seen on prior CT although the degree of consolidation has improved in the interval from the prior exam. Persistent right lower lobe and left lower lobe consolidation is noted. Complicated pleural effusion is noted on the right. A considerable amount of air is noted within the pleural effusion as well as within the pleural space. Chest tube is noted anteriorly. The air is likely related to the recent bronchoscopy and manipulation of the bronchial occlusion tube. Upper Abdomen: Visualized upper abdomen shows gastric catheter within the stomach as well as multiple ligation clips within the distal stomach. Musculoskeletal: No chest wall mass or suspicious bone lesions identified. IMPRESSION: Improved aeration within the lungs when compared with the prior CT examination. There remains consolidation within the lower lobes bilaterally worse on the right than the left. Complicated pleural effusion on the right is noted with chest tube in place. The air within the pleural space is likely related to the recent manipulation of the bronchial occlusion tube. Tubes and lines as described above. Aortic Atherosclerosis (ICD10-I70.0). Electronically Signed   By: Inez Catalina M.D.   On: 08/13/2019 15:32   Mr Brain Wo Contrast  Result Date: 08/26/2019 CLINICAL DATA:  Altered level of consciousness. History of COVID-19. EXAM: MRI HEAD WITHOUT CONTRAST TECHNIQUE: Multiplanar, multiecho pulse sequences of the brain and surrounding structures  were obtained without intravenous contrast. COMPARISON:  Head CT 01/05/2014 FINDINGS: Brain: There is cortical restricted diffusion posteriorly in the right frontal lobe consistent with acute infarct. Additional smaller regions of diffusion abnormality are present in the posterior left frontal lobe, anterior left frontal lobe, and right greater than left medial parietal lobes as well as right cerebellum with normal to slightly reduced ADC compatible with acute to early subacute infarcts. There is mild associated T2 hyperintensity/edema associated with the infarcts. A chronic microhemorrhage is noted in the region of the right external capsule. No mass, midline shift, or extra-axial fluid collection is identified. The  ventricles and sulci are within normal limits for age. The pituitary gland is borderline prominent in height without a discrete sellar mass identified on this nondedicated study. Vascular: Major intracranial vascular flow voids are preserved. Skull and upper cervical spine: Unremarkable bone marrow signal. Sinuses/Orbits: Unremarkable orbits. Large bilateral mastoid effusions. Mild left frontal sinus mucosal thickening. Other: None. IMPRESSION: 1. Small acute to early subacute infarcts involving bilateral cerebral cortex and right cerebellum. 2. Large bilateral mastoid effusions. Electronically Signed   By: Logan Bores M.D.   On: 08/26/2019 19:33   US Renal  Result Date: 08/07/2019 CLINICAL DATA:  Acute renal failure. EXAM: RENAL / URINARY TRACT ULTRASOUND COMPLETE COMPARISON:  None. FINDINGS: Right Kidney: Renal measurements: 10.0 x 5.9 x 4.8 cm = volume: 146 mL . Echogenicity within normal limits. No mass or hydronephrosis visualized. Left Kidney: Renal measurements: 11.2 x 6.8 x 5.3 cm = volume: 215 mL. Echogenicity within normal limits. No mass or hydronephrosis visualized. Bladder: Appears normal for degree of bladder distention. Other: None. IMPRESSION: Normal renal ultrasound.  Electronically Signed   By: Marijo Conception M.D.   On: 08/07/2019 09:47   Ir Fluoro Guide Cv Line Left  Result Date: 09/02/2019 CLINICAL DATA:  Renal failure, needs durable venous access for hemodialysis. Currently using temporary dialysis catheter in the left IJ. History of right IJ occlusion. EXAM: TUNNELED HEMODIALYSIS CATHETER PLACEMENT WITH ULTRASOUND AND FLUOROSCOPIC GUIDANCE TECHNIQUE: The procedure, risks, benefits, and alternatives were explained to the family. Questions regarding the procedure were encouraged and answered. The family understands and consents to the procedure. Patient was receiving adequate prophylactic antibiotic coverage already. Patency of the left IJ vein was confirmed with ultrasound with image documentation. An appropriate skin site was determined. Region was prepped using maximum barrier technique including cap and mask, sterile gown, sterile gloves, large sterile sheet, and Chlorhexidine as cutaneous antisepsis. The region was infiltrated locally with 1% lidocaine. Intravenous Fentanyl 59mg and Versed 1.550mwere administered as conscious sedation during continuous monitoring of the patient's level of consciousness and physiological / cardiorespiratory status by the radiology RN, with a total moderate sedation time of 22 minutes. Under real-time ultrasound guidance, the left IJ vein was accessed with a 21 gauge micropuncture needle; the needle tip within the vein was confirmed with ultrasound image documentation. The 018 wire would not advance centrally. Needle exchanged over the 018 guidewire for transitional dilator, which allowed advancement of a stiff angled glidewire, negotiated to the SVC. The transitional dilator was exchanged for a 5 French angiographic catheter, advanced into the IVC. A Palindrome 23 hemodialysis catheter was tunneled from the left anterior chest wall approach to the left IJ dermatotomy site. The MPA catheter was exchanged over an Amplatz wire for  serial vascular dilators which allow placement of a peel-away sheath, through which the catheter was advanced under intermittent fluoroscopy, positioned with its tips in the proximal right atrium. Spot chest radiograph confirms good catheter position. No pneumothorax. Catheter was flushed and primed per protocol. Catheter secured externally with O Prolene sutures. The left IJ dermatotomy site was closed with Dermabond. COMPLICATIONS: COMPLICATIONS None immediate FLUOROSCOPY TIME:  3 minutes 24 seconds; 12 mGy COMPARISON:  None IMPRESSION: 1. Technically successful placement of tunneled left IJ hemodialysis catheter with ultrasound and fluoroscopic guidance. Ready for routine use. ACCESS: Remains approachable for percutaneous intervention as needed. Electronically Signed   By: D Lucrezia Europe.D.   On: 09/02/2019 11:48   Ir UsKoreauide Vasc Access Left  Result Date: 09/02/2019 CLINICAL DATA:  Renal failure, needs durable venous access for hemodialysis. Currently using temporary dialysis catheter in the left IJ. History of right IJ occlusion. EXAM: TUNNELED HEMODIALYSIS CATHETER PLACEMENT WITH ULTRASOUND AND FLUOROSCOPIC GUIDANCE TECHNIQUE: The procedure, risks, benefits, and alternatives were explained to the family. Questions regarding the procedure were encouraged and answered. The family understands and consents to the procedure. Patient was receiving adequate prophylactic antibiotic coverage already. Patency of the left IJ vein was confirmed with ultrasound with image documentation. An appropriate skin site was determined. Region was prepped using maximum barrier technique including cap and mask, sterile gown, sterile gloves, large sterile sheet, and Chlorhexidine as cutaneous antisepsis. The region was infiltrated locally with 1% lidocaine. Intravenous Fentanyl 49mg and Versed 1.542mwere administered as conscious sedation during continuous monitoring of the patient's level of consciousness and physiological /  cardiorespiratory status by the radiology RN, with a total moderate sedation time of 22 minutes. Under real-time ultrasound guidance, the left IJ vein was accessed with a 21 gauge micropuncture needle; the needle tip within the vein was confirmed with ultrasound image documentation. The 018 wire would not advance centrally. Needle exchanged over the 018 guidewire for transitional dilator, which allowed advancement of a stiff angled glidewire, negotiated to the SVC. The transitional dilator was exchanged for a 5 French angiographic catheter, advanced into the IVC. A Palindrome 23 hemodialysis catheter was tunneled from the left anterior chest wall approach to the left IJ dermatotomy site. The MPA catheter was exchanged over an Amplatz wire for serial vascular dilators which allow placement of a peel-away sheath, through which the catheter was advanced under intermittent fluoroscopy, positioned with its tips in the proximal right atrium. Spot chest radiograph confirms good catheter position. No pneumothorax. Catheter was flushed and primed per protocol. Catheter secured externally with O Prolene sutures. The left IJ dermatotomy site was closed with Dermabond. COMPLICATIONS: COMPLICATIONS None immediate FLUOROSCOPY TIME:  3 minutes 24 seconds; 12 mGy COMPARISON:  None IMPRESSION: 1. Technically successful placement of tunneled left IJ hemodialysis catheter with ultrasound and fluoroscopic guidance. Ready for routine use. ACCESS: Remains approachable for percutaneous intervention as needed. Electronically Signed   By: D Lucrezia Europe.D.   On: 09/02/2019 11:48   Dg Chest Port 1 View  Result Date: 09/05/2019 CLINICAL DATA:  Right pneumothorax. EXAM: PORTABLE CHEST 1 VIEW COMPARISON:  09/04/2019.  09/03/2019.  CT 09/01/2019. FINDINGS: Tracheostomy tube, feeding tube, left IJ dialysis catheter in stable position. Right chest tube in stable position. Stable tiny right base pneumothorax again noted. No interim change.  Patchy bilateral pulmonary infiltrates again noted without interim change. Changes of pleural-parenchymal scarring again noted. Heart size stable. IMPRESSION: 1. Lines and tubes including right chest tube in stable position. Tiny right base pneumothorax again noted without interim change. 2. Patchy bilateral pulmonary infiltrates are again noted without interval change. Electronically Signed   By: ThMarcello MooresRegister   On: 09/05/2019 06:04   Dg Chest Port 1 View  Result Date: 09/04/2019 CLINICAL DATA:  Right pneumothorax. EXAM: PORTABLE CHEST 1 VIEW COMPARISON:  September 03, 2019. FINDINGS: Stable cardiomediastinal silhouette. Tracheostomy and feeding tubes are unchanged in position. Left internal jugular dialysis catheter is unchanged. Stable right-sided chest tube is noted. Stable probable minimal right basilar pneumothorax is noted laterally. Stable bilateral lung opacities are noted. Bony thorax is unremarkable. IMPRESSION: Stable support apparatus. Stable bilateral lung opacities are noted. Stable position of right-sided chest tube with probable minimal right basilar pneumothorax. Electronically Signed   By: JaBobbe Medico.  On: 09/04/2019 07:25   Dg Chest Port 1 View  Result Date: 09/03/2019 CLINICAL DATA:  Tracheostomy. EXAM: PORTABLE CHEST 1 VIEW COMPARISON:  September 01, 2019. FINDINGS: Tracheostomy tube is in good position. Interval placement of feeding tube which is seen entering stomach. Interval placement of left internal jugular dialysis catheter with distal tip in expected position of cavoatrial junction. Stable bilateral lung opacities are noted. Right-sided chest tube is noted with minimal right basilar pneumothorax. Bony thorax is unremarkable. IMPRESSION: Tracheostomy tube in good position. Interval placement of feeding tube and left internal jugular dialysis catheter. Stable right-sided chest tube is noted with minimal right basilar pneumothorax. Stable bilateral lung opacities are  noted. Electronically Signed   By: Marijo Conception M.D.   On: 09/03/2019 10:25   Dg Chest Port 1 View  Result Date: 09/01/2019 CLINICAL DATA:  68 year old male with chest tube placement. COVID-19 positive. EXAM: PORTABLE CHEST 1 VIEW COMPARISON:  Chest radiograph dated 08/31/2019 and chest CT dated 08/13/2019 FINDINGS: Support apparatus in stable position. No significant interval change in the appearance of the lungs and bilateral confluent densities predominantly involving the peripheral/subpleural regions as well as bibasilar lungs since the prior radiograph. Right lung base cystic or bolus area appears similar to prior radiograph. No large or detectable pneumothorax. Faint linear density over the right apex appears similar to prior radiograph, likely artifactual and less likely represents pleural reflection. Stable cardiac silhouette. No acute osseous pathology. IMPRESSION: 1. No significant interval change in the appearance of the lungs compared to the prior radiograph. 2. Support apparatus in stable position. Electronically Signed   By: Anner Crete M.D.   On: 09/01/2019 09:55   Dg Chest Port 1 View  Result Date: 08/31/2019 CLINICAL DATA:  COVID-19 positive.  Tracheostomy tube. EXAM: PORTABLE CHEST 1 VIEW COMPARISON:  08/30/2019 FINDINGS: Tracheostomy tube in adequate position. Enteric tube courses into the stomach and off the film as tip is not visualized. Right medial basilar chest tube unchanged. Left IJ central venous catheter horizontally oriented over the SVC unchanged. Lungs are hypoinflated with persistent tiny right-sided pneumothorax. Stable opacification over the right midlung and right base. Stable hazy opacification over the left base/retrocardiac region. Remainder of the exam is unchanged. IMPRESSION: 1. Stable opacification over the right midlung and bibasilar regions. 2.  Stable tiny right-sided pneumothorax. 3.  Tubes and lines as described. Electronically Signed   By: Marin Olp  M.D.   On: 08/31/2019 07:15   Dg Chest Port 1 View  Result Date: 08/30/2019 CLINICAL DATA:  Pneumothorax on right EXAM: PORTABLE CHEST 1 VIEW COMPARISON:  Chest radiograph 08/26/2019, 08/23/2019 FINDINGS: Stable cardiomediastinal contours. Unchanged for apparatus. Persistent small right basilar pneumothorax with chest tube in place. Persistent infiltrates/atelectasis in the right mid upper lung and left base. No large pleural effusion. No acute finding in the visualized skeleton. IMPRESSION: Unchanged chest radiograph with persistent small right basilar pneumothorax and atelectasis/infiltrates in the right mid to upper lung as well as left base. Electronically Signed   By: Audie Pinto M.D.   On: 08/30/2019 09:42   Dg Chest Port 1 View  Result Date: 08/26/2019 CLINICAL DATA:  Pneumothorax. EXAM: PORTABLE CHEST 1 VIEW COMPARISON:  08/23/2019. FINDINGS: Tracheostomy tube, left IJ line, feeding tube, right chest tube in stable position. Stable right base pneumothorax. Stable atelectatic changes/infiltrate right upper lung and left base. Heart size stable. IMPRESSION: 1. Lines and tubes including right chest tube in stable position. Stable right base pneumothorax. 2. Stable right upper lung and  left lower atelectatic changes/infiltrates. Chest is unchanged from prior exam. Electronically Signed   By: Marcello Moores  Register   On: 08/26/2019 10:36   Dg Chest Port 1 View  Result Date: 08/23/2019 CLINICAL DATA:  COVID-19 positive, endotracheal tube in place. EXAM: PORTABLE CHEST 1 VIEW COMPARISON:  Chest x-rays dated 08/22/2019. FINDINGS: Tubes and lines are stable in position, including a RIGHT-sided chest tube. Prominent lucency again noted at the RIGHT lung base, stable in the short-term interval, compatible with the air that was associated with a complicated pleural effusion as demonstrated on chest CT of 08/13/2019, at that time suspected to represent sequela of manipulation of a bronchial occlusion tube at  bronchoscopy. Patchy airspace opacities are again seen bilaterally, stable, presumed pneumonia. No new lung findings. IMPRESSION: 1. Stable chest x-ray. Tubes and lines are stable in position, including the RIGHT-sided chest tube with tip positioned at the medial aspects of the RIGHT lung base 2. Patchy airspace opacities bilaterally, stable, presumed bilateral pneumonia. 3. Stable appearance of the collection of air at the RIGHT lung base, as described above. 4. No new lung findings. Electronically Signed   By: Franki Cabot M.D.   On: 08/23/2019 09:04   Dg Chest Port 1 View  Result Date: 08/22/2019 CLINICAL DATA:  Status post tracheostomy. COVID-19 viral pneumonia. EXAM: PORTABLE CHEST 1 VIEW COMPARISON:  08/22/2019 FINDINGS: A new tracheostomy tube is seen in appropriate position. Feeding tube and bilateral central venous catheters remain in stable position. Tip of the right subclavian central venous catheter seen overlying the inferior right atrium. Heart size is within normal limits allowing for low lung volumes. Right chest tube remains in place. No pneumothorax visualized. Bullous disease again seen in right lung base. Increased atelectasis or airspace disease is seen in the retrocardiac left lung base. Mild bilateral peripheral pulmonary airspace disease shows no significant change. IMPRESSION: New tracheostomy tube in appropriate position. Right subclavian Center venous catheter tip overlies the lower right atrium. Increased atelectasis or airspace disease in retrocardiac left lung base. Electronically Signed   By: Marlaine Hind M.D.   On: 08/22/2019 16:49   Dg Chest Port 1 View  Result Date: 08/22/2019 CLINICAL DATA:  Intubated. EXAM: PORTABLE CHEST 1 VIEW COMPARISON:  08/21/2019 FINDINGS: Endotracheal tube in satisfactory position. Feeding tube extending into the distal stomach with its tip not included. Right subclavian catheter tip in the region of the superior cavoatrial junction or upper  right atrium. No significant change in bilateral interstitial and patchy opacities and right basilar bullous changes. No definite pneumothorax. Minimal right pleural fluid. Normal sized heart. Thoracic spine degenerative changes. IMPRESSION: 1. No significant change in bilateral pneumonia and interstitial disease and right basilar bullous changes. 2. Minimal right pleural fluid. Electronically Signed   By: Claudie Revering M.D.   On: 08/22/2019 07:12   Dg Chest Port 1 View  Result Date: 08/21/2019 CLINICAL DATA:  COVID-19, intubation EXAM: PORTABLE CHEST 1 VIEW COMPARISON:  Portable exam 0535 hours compared to 08/20/2019 FINDINGS: Tip of endotracheal tube projects 3.4 cm above carina. Feeding tube extends into stomach. LEFT jugular dual-lumen central venous catheter tip projects over confluence of the SVC and LEFT brachiocephalic vein. Tip of RIGHT jugular line projects over RIGHT atrium; recommend withdrawal 4 cm to place tip at cavoatrial junction. RIGHT thoracostomy tube unchanged. Stable heart size mediastinal contours. Patchy BILATERAL airspace infiltrates consistent with pneumonia. No pleural effusion. Tiny loculated pneumothorax at lateral RIGHT base. IMPRESSION: Tiny residual RIGHT basilar atelectasis despite thoracostomy tube. Persistent BILATERAL pulmonary  infiltrates consistent with pneumonia. Electronically Signed   By: Lavonia Dana M.D.   On: 08/21/2019 08:44   Dg Chest Port 1 View  Result Date: 08/20/2019 CLINICAL DATA:  68 year old male with a history of intubation EXAM: PORTABLE CHEST 1 VIEW COMPARISON:  08/19/2019, 08/18/2019, 08/17/2019 FINDINGS: Cardiomediastinal silhouette unchanged in size and contour. Low lung volumes persist. Unchanged endotracheal tube which terminates 2.4 cm above the carina. Unchanged gastric tube terminating in the stomach. Unchanged left IJ temporary hemodialysis catheter which terminates at the confluence of the left brachiocephalic vein and SVC. Unchanged right  subclavian central venous catheter with the tip appearing to terminate superior right atrium. Patchy opacities of the bilateral lungs, unchanged from the prior. No pneumothorax. Blunting of left costophrenic angle with opacity in the retrocardiac region. IMPRESSION: Similar appearance of the lungs with low lung volumes and bilateral patchy opacities, likely combination of atelectasis/consolidation, developing chronic changes, and possible small left pleural effusion. Unchanged endotracheal tube, left IJ temporary hemodialysis catheter, right subclavian central venous catheter, and gastric tube. Electronically Signed   By: Corrie Mckusick D.O.   On: 08/20/2019 07:50   Dg Chest Port 1 View  Result Date: 08/19/2019 CLINICAL DATA:  Pneumonia and COVID-19. EXAM: PORTABLE CHEST 1 VIEW COMPARISON:  August 18, 2019. FINDINGS: Stable cardiomediastinal silhouette. Endotracheal and nasogastric tubes are unchanged in position. Left internal jugular and right subclavian catheters are unchanged in position. Stable right-sided chest tube is noted with probable stable loculated right basilar hydropneumothorax. Stable opacities are noted throughout both lungs concerning for pneumonia. Bony thorax is unremarkable. IMPRESSION: Stable support apparatus. Stable right-sided chest tube with probable right basilar hydropneumothorax. Stable bilateral lung opacities. Electronically Signed   By: Marijo Conception M.D.   On: 08/19/2019 07:44   Dg Chest Port 1 View  Result Date: 08/18/2019 CLINICAL DATA:  Follow-up endotracheal tube EXAM: PORTABLE CHEST 1 VIEW COMPARISON:  08/17/2019 FINDINGS: Cardiac shadow is stable. Right subclavian central line is noted in satisfactory position. Endotracheal tube and gastric catheter are again seen and stable. Left jugular dialysis catheter is noted. Patchy opacities are again seen in both lungs. Right chest tube is again noted with loculated hydropneumothorax stable from the prior exam. IMPRESSION:  Overall stable appearance of the chest. Electronically Signed   By: Inez Catalina M.D.   On: 08/18/2019 03:09   Dg Chest Port 1 View  Result Date: 08/17/2019 CLINICAL DATA:  Right-sided pneumothorax EXAM: PORTABLE CHEST 1 VIEW COMPARISON:  08/16/2019 FINDINGS: No significant interval change in AP portable examination with right-sided chest tube in position and a small, persistent, loculated appearing hydropneumothorax about the right lung base. There is redemonstrated, extensive bilateral dense heterogeneous and interstitial airspace opacity, worse on the right. Support apparatus including endotracheal tubes, esophagogastric tube, left neck and right subclavian vascular catheters are unchanged. Cardiomegaly. IMPRESSION: 1. No significant interval change in AP portable examination with right-sided chest tube in position and a small, persistent, loculated appearing hydropneumothorax about the right lung base. 2. There is redemonstrated, extensive bilateral dense heterogeneous and interstitial airspace opacity, worse on the right, consistent with multifocal infection. 3.  Unchanged support apparatus. Electronically Signed   By: Eddie Candle M.D.   On: 08/17/2019 15:54   Dg Chest Port 1 View  Result Date: 08/16/2019 CLINICAL DATA:  68 year old male with positive COVID-19. EXAM: PORTABLE CHEST 1 VIEW COMPARISON:  Earlier radiograph dated 08/16/2019 FINDINGS: Support lines and tubes as seen previously. Diffuse bilateral airspace opacities similar to prior radiograph. Apparent area of lucency  at the right costophrenic angle may be artifactual and related to skin fold or represent a small residual right pneumothorax. Stable cardiac silhouette. No acute osseous pathology. IMPRESSION: 1. Artifact versus small residual right pneumothorax at the right costophrenic angle. 2. Diffuse bilateral airspace opacities similar to prior radiograph. 3. Support lines and tubes as seen previously. Electronically Signed   By: Anner Crete M.D.   On: 08/16/2019 21:01   Dg Chest Port 1 View  Result Date: 08/16/2019 CLINICAL DATA:  Hypoxia EXAM: PORTABLE CHEST 1 VIEW COMPARISON:  August 15, 2019 FINDINGS: Endotracheal tube tip is 4.2 cm above the carina. Nasogastric tube tip and side port are below the diaphragm. Right subclavian catheter tip is in the right atrium. Chest tube noted on the right inferiorly, unchanged in position. Left internal jugular catheter tip is at the junction of the left innominate vein and superior vena cava. There is a catheter with its tip overlying the right lower lobe bronchus medially, stable. No pneumothorax is evident currently. There is persistent airspace consolidation in the right mid and lower lung zones. There is patchy opacity in the left mid and lower lung zones, stable. No new opacity evident. Heart is upper normal in size with pulmonary vascular normal. No adenopathy. No bone lesions. IMPRESSION: Stable tube and catheter positions. No pneumothorax evident currently. Airspace opacity bilaterally with areas of consolidation on the right, stable. No new opacity evident. Stable cardiac silhouette. Electronically Signed   By: Lowella Grip III M.D.   On: 08/16/2019 09:06   Dg Chest Port 1 View  Result Date: 08/15/2019 CLINICAL DATA:  Orogastric tube placement. EXAM: PORTABLE CHEST 1 VIEW COMPARISON:  Same day. FINDINGS: Stable cardiomediastinal silhouette. Distal tip of enteric tube is seen in proximal stomach. Endotracheal tube is unchanged. Left internal jugular catheter is unchanged. Right subclavian catheter is unchanged. Stable position of right-sided basilar chest tube is noted without pneumothorax. Stable bilateral lung opacities are noted. Stable catheter seen entering right mainstem bronchus compared to prior exam. Bony thorax is unremarkable. IMPRESSION: Feeding tube has been removed. There has been interval placement of what appears to be nasogastric tube with tip in expected  position of proximal stomach. Stable bilateral lung opacities and other support apparatus are noted. Electronically Signed   By: Marijo Conception M.D.   On: 08/15/2019 14:55   Dg Chest Port 1 View  Result Date: 08/15/2019 CLINICAL DATA:  Chest tube, shortness of breath EXAM: PORTABLE CHEST 1 VIEW COMPARISON:  08/14/2019 FINDINGS: Support devices including right basilar chest tube remain in place, unchanged. No definite pneumothorax currently. Patchy bilateral airspace opacities are again noted, unchanged. Heart is borderline in size. IMPRESSION: Patchy bilateral airspace disease again noted, unchanged. No definite visible pneumothorax. Electronically Signed   By: Rolm Baptise M.D.   On: 08/15/2019 07:58   Dg Chest Port 1 View  Result Date: 08/14/2019 CLINICAL DATA:  Pneumothorax, chest tube EXAM: PORTABLE CHEST 1 VIEW COMPARISON:  08/14/2019 FINDINGS: Right chest tube and remainder support devices are stable. No pneumothorax. Patchy bilateral airspace opacities, right greater than left again noted, stable. Mild cardiomegaly. No visible effusions. IMPRESSION: No visible pneumothorax.  No change since prior study Electronically Signed   By: Rolm Baptise M.D.   On: 08/14/2019 11:46   Dg Chest Port 1 View  Result Date: 08/14/2019 CLINICAL DATA:  Acute respiratory failure with hypoxemia. EXAM: PORTABLE CHEST 1 VIEW COMPARISON:  August 13, 2019. FINDINGS: Stable cardiomediastinal silhouette. Endotracheal and feeding tubes are unchanged in  position. Stable left internal jugular and right subclavian catheters are noted. Stable catheter is seen extending into right mainstem bronchus. Stable bilateral lung opacities are noted. Stable right-sided chest tube without pneumothorax. Small bilateral pleural effusions may be present. Bony thorax is unremarkable. IMPRESSION: Grossly stable support apparatus. Stable bilateral lung opacities as described above. Electronically Signed   By: Marijo Conception M.D.   On:  08/14/2019 07:43   Dg Chest Port 1 View  Result Date: 08/13/2019 CLINICAL DATA:  Endotracheal to EXAM: PORTABLE CHEST 1 VIEW COMPARISON:  [id number] hours FINDINGS: Endotracheal tube and 2 central venous line unchanged. NG tube exchanged for feeding tube. A catheter extends through the lumen of the endotracheal tube into the RIGHT mainstem bronchus. There is hydropneumothorax at the RIGHT lung base again noted. There is diffuse patchy airspace disease which is slightly worse on the RIGHT and similar on the LEFT. IMPRESSION: 1. Stable support apparatus. 2. No change in subpulmonic RIGHT hydropneumothorax. 3. No change in patchy airspace disease in the RIGHT upper lobe. 4. Slight worsening airspace disease in the LEFT lung. Electronically Signed   By: Suzy Bouchard M.D.   On: 08/13/2019 17:39   Dg Chest Port 1 View  Result Date: 08/13/2019 CLINICAL DATA:  Right pneumothorax EXAM: PORTABLE CHEST 1 VIEW COMPARISON:  08/13/2019 FINDINGS: Support devices including right chest tube remain in place, unchanged. Lucency again noted over the right lung base which could reflect loculated right pneumothorax. Airspace disease throughout the lungs bilaterally, right greater than left with improvement in aeration in the left lung since prior study. Heart is normal size. IMPRESSION: Continued lucency over the right lung base could reflect loculated right pneumothorax. Bilateral airspace disease, right greater than left. Improved airspace disease on the left since prior study. Electronically Signed   By: Rolm Baptise M.D.   On: 08/13/2019 11:34   Dg Chest Port 1 View  Result Date: 08/13/2019 CLINICAL DATA:  Respiratory failure EXAM: PORTABLE CHEST 1 VIEW COMPARISON:  Yesterday FINDINGS: Endotracheal tube tip is at the clavicular heads. Right subclavian central line with tip at the upper cavoatrial junction. Endobronchial blocker on the right in stable position over the right hilum. The orogastric tube reaches the  stomach. Right base chest tube which crosses the midline, presumably anterior and accentuated by leftward rotation. Basal pneumothorax on the right is unchanged. Unchanged bilateral airspace disease. IMPRESSION: 1. Unchanged hardware positioning including endobronchial blocker tip overlapping the right hilum. 2. Unchanged bilateral airspace disease with right base lucency more likely loculated pneumothorax rather than cavitary pneumonia Electronically Signed   By: Monte Fantasia M.D.   On: 08/13/2019 09:04   Dg Chest Port 1 View  Result Date: 08/12/2019 CLINICAL DATA:  Respiratory failure. EXAM: PORTABLE CHEST 1 VIEW COMPARISON:  Chest x-ray 08/10/2019. FINDINGS: Endotracheal tube, dual-lumen left IJ line, right PICC line in stable position. Right lower lobe bronchus balloon occlusion catheter stable position. Right chest tube in stable position. Small right base pneumothorax cannot be excluded on today's exam. Bibasilar atelectasis/infiltrates again noted. No interim change. Tiny bilateral pleural effusions. IMPRESSION: 1. Endotracheal tube, dual-lumen left IJ line, right PICC line stable position. Right lower lobe bronchus balloon occlusion catheter in stable position. Right chest tube in stable position. 2. Small right base pneumothorax cannot be excluded on today's exam. 3. Bibasilar atelectasis/infiltrates again noted without interim change. Critical Value/emergent results were called by telephone at the time of interpretation on 08/12/2019 at 7:31 am to the patient's nurse, who verbally acknowledged these results.  Electronically Signed   By: Marcello Moores  Register   On: 08/12/2019 07:33   Dg Chest Port 1 View  Result Date: 08/10/2019 CLINICAL DATA:  COVID positive EXAM: PORTABLE CHEST 1 VIEW COMPARISON:  08/09/2019 FINDINGS: Left lung apex and lateral costophrenic angle are partially visualized. Endotracheal tube terminates 5 cm above the carina. Right chest tube.  No pneumothorax is seen. Left IJ dual  lumen catheter terminates in the mid SVC. Right subclavian venous catheter terminates at the cavoatrial junction. Enteric tube terminates in the proximal stomach. Stable selective balloon occlusion catheter in the right lower lobe bronchus. Multifocal patchy opacities, lower lung predominant, grossly unchanged. No definite pleural effusions. The heart is normal in size. IMPRESSION: Multifocal patchy opacities, grossly unchanged, compatible with pneumonia in this patient with known COVID. Stable right chest tube.  No pneumothorax is seen. Endotracheal tube terminates 5 cm above the carina. Additional stable support apparatus as above. Electronically Signed   By: Julian Hy M.D.   On: 08/10/2019 16:59   Dg Chest Port 1 View  Result Date: 08/09/2019 CLINICAL DATA:  Right-sided pneumothorax. Acute respiratory failure. COVID-19. EXAM: PORTABLE CHEST 1 VIEW COMPARISON:  08/08/2019; 08/07/2019; chest CT-07/25/2019 FINDINGS: Grossly unchanged cardiac silhouette and mediastinal contours. Stable positioning of support apparatus with endotracheal tube overlying tracheal air column with tip superior to the carina, left jugular approach central venous catheter tips projected over the superior SVC, right subclavian approach central venous catheter tip projects over the superior caval junction, enteric tube tip and side port projected over the gastric antrum and right-sided chest tube and selective balloon occlusion catheter involving the right mainstem bronchus. No pneumothorax. Redemonstrated extensive bilateral slightly nodular airspace opacities. Left basilar/retrocardiac consolidative opacities are unchanged. No new focal airspace opacities. No definite pleural effusion. No acute osseous abnormalities. IMPRESSION: 1.  Stable positioning of support apparatus.  No pneumothorax. 2. No significant change in extensive bilateral airspace opacities compatible with provided history of COVID-19 infection. Electronically  Signed   By: Sandi Mariscal M.D.   On: 08/09/2019 05:18   Dg Chest Port 1 View  Result Date: 08/08/2019 CLINICAL DATA:  Pulmonary infiltrates. Acute respiratory failure with hypoxemia. COVID-19. EXAM: PORTABLE CHEST 1 VIEW COMPARISON:  08/07/2019 and 08/06/2019 FINDINGS: Endotracheal tube in good position 4.7 cm above the carina. Uniblocker tube tip is in the right lower lobe. Left jugular vein catheter tip is in the SVC at the level of the carina. Right subclavian catheter tip is in the right atrium. Right-sided chest tube at the base, unchanged. NG tube tip below the diaphragm. There is improved aeration at the right lung base and right midzone. No change in the infiltrates at the left base and left midzone. Heart size and vascularity are normal. IMPRESSION: 1. Improved aeration at the right lung base. No change in the infiltrates at the left base and left midzone. No residual pneumothorax. 2. Tubes and lines appear essentially unchanged. Electronically Signed   By: Lorriane Shire M.D.   On: 08/08/2019 16:27   Dg Chest Port 1 View  Result Date: 08/07/2019 CLINICAL DATA:  Evaluate endotracheal tube position. COVID-19 positive. EXAM: PORTABLE CHEST 1 VIEW COMPARISON:  08/06/2019 FINDINGS: Endotracheal tube terminates 4.9 cm above carina. Nasogastric tube extends beyond the inferior aspect of the film. A right-sided chest tube remains in place. The right-sided pleural drain has been removed. Left internal jugular line tip at mid SVC. Right subclavian line tip at high right atrium, similar. Mild cardiomegaly. Increased fluid within the right minor fissure. The  previously described right apical pneumothorax is either improved or resolved. Possible trace pleural air remaining. Increased right base airspace disease. Relatively similar patchy right upper and left mid to lower lung pulmonary opacities. IMPRESSION: Appropriate position of endotracheal tube. Single right chest tube remaining in place, with improved to  resolved right apical pneumothorax. Increased right pleural fluid with worsened right base airspace disease. Otherwise similar multifocal pneumonia. Electronically Signed   By: Abigail Miyamoto M.D.   On: 08/07/2019 20:18   Dg Chest Port 1 View  Result Date: 08/06/2019 CLINICAL DATA:  Chest tube, follow-up pneumothorax EXAM: PORTABLE CHEST 1 VIEW COMPARISON:  Chest radiograph from earlier today. FINDINGS: Endotracheal tube tip is 4.2 cm above the carina. Enteric tube enters stomach with the tip not seen on this image. Left internal jugular central venous catheter terminates in the middle third of the SVC. Right subclavian central venous catheter terminates over the right atrium. Stable right pigtail chest tube with tip in the peripheral lower right pleural space. Separate right basilar chest tube. Stable cardiomediastinal silhouette with normal heart size. Small residual right apical pneumothorax, significantly decreased. No left pneumothorax. Midline mediastinum. No pleural effusion. Extensive patchy opacities throughout both lungs appear unchanged. IMPRESSION: 1. Small residual right apical pneumothorax, significantly decreased. 2. Stable extensive patchy opacities throughout both lungs. 3. Well-positioned support structures as described. Electronically Signed   By: Ilona Sorrel M.D.   On: 08/06/2019 15:25   Dg Chest Port 1 View  Addendum Date: 08/06/2019   ADDENDUM REPORT: 08/06/2019 13:46 ADDENDUM: These results were called by telephone at the time of interpretation on 08/06/2019 at 1:43 pm to provider Dr. Roselie Awkward, who verbally acknowledged these results. Electronically Signed   By: Aletta Edouard M.D.   On: 08/06/2019 13:46   Result Date: 08/06/2019 CLINICAL DATA:  Replacement of non tunneled dialysis catheter. Respiratory failure secondary to COVID-19 pneumonia. EXAM: PORTABLE CHEST 1 VIEW COMPARISON:  Film earlier today at 1119 hours FINDINGS: Replaced left jugular non tunneled dialysis  catheter now projects inferiorly with the tip in the SVC. Other support apparatus stable with the endotracheal tube tip approximately 5 cm above the carina. Right lateral pneumothorax is slightly larger with stable positioning of a lateral pigtail thoracostomy tube. Bilateral severe pneumonia appears stable since the prior chest x-ray. Lucency abutting the right hemidiaphragm may relate to a component subpulmonic air. However, free intraperitoneal air under the right hemidiaphragm is not entirely excluded by semi-erect chest x-ray. IMPRESSION: 1. Replaced left jugular non tunneled dialysis catheter tip is in the SVC. 2. Slightly larger right lateral pneumothorax. 3. Cannot exclude free air under the right hemidiaphragm. However, the appearance may relate to relative lucency of the lung at the right lung base and component of basilar pneumothorax as well. Electronically Signed: By: Aletta Edouard M.D. On: 08/06/2019 13:41   Dg Chest Port 1 View  Result Date: 08/06/2019 CLINICAL DATA:  COVID.  Insertion of dialysis catheter EXAM: PORTABLE CHEST 1 VIEW COMPARISON:  None. FINDINGS: Left internal jugular dialysis catheter courses across the midline and likely passes into the right innominate vein. Right central line tip is at the cavoatrial junction. Endotracheal tube and NG tube are unchanged. Cardiomegaly. Patchy bilateral airspace opacities again noted, unchanged. Right chest tube remains in place with right basilar pneumothorax, stable. IMPRESSION: Interval placement of left internal jugular Vas-Cath. The tip likely passes into the right innominate vein. No left pneumothorax. Right chest tube remains in place with small right basilar pneumothorax, stable. Stable patchy bilateral airspace  opacities. Electronically Signed   By: Rolm Baptise M.D.   On: 08/06/2019 11:56   Dg Abd Decub  Result Date: 08/06/2019 CLINICAL DATA:  Rule out pneumoperitoneum EXAM: ABDOMEN - 1 VIEW DECUBITUS COMPARISON:  08/02/2019  abdominal radiographs FINDINGS: No evidence of pneumatosis or pneumoperitoneum. No disproportionately dilated small bowel loops. Moderate gas in the nondependent colon. Enteric tube tip is seen in the proximal stomach. IMPRESSION: 1. No evidence of pneumoperitoneum. 2. Moderate gas in the nondependent colon. Nonobstructive bowel gas pattern. 3. Enteric tube tip in the proximal stomach. Electronically Signed   By: Ilona Sorrel M.D.   On: 08/06/2019 17:16   Dg Abd Portable 1v  Result Date: 08/30/2019 CLINICAL DATA:  NG tube placement EXAM: PORTABLE ABDOMEN - 1 VIEW COMPARISON:  08/16/2019 FINDINGS: There is a nasogastric tube with the tip projecting over the stomach. There is no bowel dilatation to suggest obstruction. There is no evidence of pneumoperitoneum, portal venous gas or pneumatosis. There are no pathologic calcifications along the expected course of the ureters. The osseous structures are unremarkable. IMPRESSION: Nasogastric tube with the tip projecting over the stomach. Electronically Signed   By: Kathreen Devoid   On: 08/30/2019 15:56   Dg Abd Portable 1v  Result Date: 08/16/2019 CLINICAL DATA:  OG tube placement EXAM: PORTABLE ABDOMEN - 1 VIEW COMPARISON:  08/13/2019 FINDINGS: Nasogastric tube with the tip projecting over the stomach. There is no bowel dilatation to suggest obstruction. There is no evidence of pneumoperitoneum, portal venous gas or pneumatosis. There are no pathologic calcifications along the expected course of the ureters. The osseous structures are unremarkable. IMPRESSION: Nasogastric tube with the tip projecting over the stomach. Electronically Signed   By: Kathreen Devoid   On: 08/16/2019 12:09   Dg Abd Portable 1v  Result Date: 08/13/2019 CLINICAL DATA:  ETT advancement, Feeding tube placement EXAM: PORTABLE ABDOMEN - 1 VIEW COMPARISON:  Radiograph 08/10/2019 FINDINGS: NG tube has been replaced with a feeding tube. The tip of the feeding tube is within the second portion  the duodenum. Gas in the stomach. No dilated loops of large or small bowel. Bibasilar effusions and atelectasis. IMPRESSION: 1. Feeding tube with tip in the second portion the duodenum. 2. No evidence of bowel obstruction. 3. Bibasilar effusions and atelectasis. Electronically Signed   By: Suzy Bouchard M.D.   On: 08/13/2019 17:30     Time Spent in minutes  30     Desiree Hane M.D on 09/05/2019 at 10:23 AM  To page go to [email or web address] - password [facility]

## 2019-09-05 NOTE — Progress Notes (Signed)
27 Days Post-Op Procedure(s) (LRB): ESOPHAGOGASTRODUODENOSCOPY (EGD) (N/A) HEMOSTASIS CONTROL HEMOSTASIS CLIP PLACEMENT Subjective: Frequent cough  Objective: Vital signs in last 24 hours: Temp:  [99.2 F (37.3 C)-99.5 F (37.5 C)] 99.5 F (37.5 C) (11/12 1955) Pulse Rate:  [97-124] 110 (11/13 0728) Cardiac Rhythm: Normal sinus rhythm (11/13 0400) Resp:  [16-32] 28 (11/13 0728) BP: (88-139)/(60-79) 103/61 (11/13 0600) SpO2:  [94 %-100 %] 95 % (11/13 0728) FiO2 (%):  [28 %-40 %] 28 % (11/13 0728)  Hemodynamic parameters for last 24 hours:    Intake/Output from previous day: 11/12 0701 - 11/13 0700 In: 1120 [I.V.:80; NG/GT:990; IV Piggyback:50] Out: 225 [Stool:250] Intake/Output this shift: No intake/output data recorded.  General appearance: alert and cooperative Neurologic: - Heart: regular rate and rhythm Lungs: rhonchi bilaterally No air leak  Lab Results: Recent Labs    09/03/19 0440 09/04/19 0730  WBC 8.7 12.1*  HGB 7.6* 7.4*  HCT 24.2* 23.3*  PLT 304 331   BMET:  Recent Labs    09/03/19 0440 09/04/19 0730  NA 135 135  K 4.2 4.5  CL 98 94*  CO2 23 23  GLUCOSE 118* 103*  BUN 75* 112*  CREATININE 4.26* 5.76*  CALCIUM 8.3* 8.7*    PT/INR: No results for input(s): LABPROT, INR in the last 72 hours. ABG    Component Value Date/Time   PHART 7.401 08/23/2019 0453   HCO3 25.7 08/23/2019 0453   TCO2 27 08/23/2019 0453   ACIDBASEDEF 2.0 08/14/2019 0515   O2SAT 93.0 08/23/2019 0453   CBG (last 3)  Recent Labs    09/04/19 1531 09/04/19 1952 09/05/19 0441  GLUCAP 105* 107* 112*    Assessment/Plan: S/P Procedure(s) (LRB): ESOPHAGOGASTRODUODENOSCOPY (EGD) (N/A) HEMOSTASIS CONTROL HEMOSTASIS CLIP PLACEMENT -No air leak while observing for several minutes this AM Will place CT to water seal and observe Will be conservative about removing tube due to prolonged nature of leak   LOS: 42 days    Melrose Nakayama 09/05/2019

## 2019-09-05 NOTE — Progress Notes (Signed)
Occupational Therapy Treatment Patient Details Name: [name] MRN: [id number] DOB: September 06, 1951 Today's Date: 09/05/2019    History of present illness 68 y/o male diagnosed with COVID on 9/29 presented to the [facility] ED on 10/2 with dyspnea, hypoxemia requiring intubation.  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  Tracheostomy placed on 10/30. Tested COVID neg x2 on 11/2. MRI on 11/4 showed There is cortical restricted diffusion posteriorly in the right frontal lobe consistent with acute infarct. Additional smaller regions of diffusion abnormality are present in the posterior left frontal lobe, anterior left frontal lobe, and right greater than left medial parietal lobes as well as right cerebellum.    OT comments  Pt following commands with head nods, open eyes to name, tracking toward the right visual field and squeezing release with R hand appropriate. Pt with eyes deviated to the Right side and dysconjugate. Pt with noted bleeding from lower abdomen R side below belly button. Rn made aware and dressing bleeding spot.   Follow Up Recommendations  SNF;Supervision/Assistance - 24 hour    Equipment Recommendations  Wheelchair cushion (measurements OT);Wheelchair (measurements OT);Hospital bed    Recommendations for Other Services      Precautions / Restrictions Precautions Precautions: None       Mobility Bed Mobility                  Transfers                 General transfer comment: defer for safety    Balance                                           ADL either performed or assessed with clinical judgement   ADL Overall ADL's : Needs assistance/impaired                                       General ADL Comments: total (A) for all adls. hand over hand with yonker in hand to suction mouth. pt closing  mouth appropriately     Vision       Perception     Praxis      Cognition Arousal/Alertness: Awake/alert Behavior During Therapy: Flat affect Overall Cognitive Status: Impaired/Different from baseline Area of Impairment: Following commands                       Following Commands: Follows one step commands with increased time;Follows one step commands inconsistently       General Comments: pt able to nod head yes and able to squeeze hand consistently        Exercises Other Exercises Other Exercises: PROM shoulder flexion , cervical rotation toward midline, shoulder abduction elbow flexion wrist extension and digit flexion Other Exercises: positioned in elevation for edema management   Shoulder Instructions       General Comments VSS    Pertinent Vitals/ Pain       Pain Assessment: No/denies pain  Home Living  Prior Functioning/Environment              Frequency  Min 2X/week        Progress Toward Goals  OT Goals(current goals can now be found in the care plan section)  Progress towards OT goals: Progressing toward goals  Acute Rehab OT Goals Patient Stated Goal: none stated OT Goal Formulation: With family Time For Goal Achievement: 09/09/19 Potential to Achieve Goals: Good ADL Goals Pt Will Perform Grooming: with mod assist;sitting;bed level Additional ADL Goal #1: Pt will perform bed mobility with Mod A in preparation for ADLs Additional ADL Goal #2: Pt will tolerate sitting at EOB for 10 minutes with Min A Additional ADL Goal #3: Pt will sustain attention to simple ADL with Mod cues  Plan Discharge plan remains appropriate    Co-evaluation                 AM-PAC OT "6 Clicks" Daily Activity     Outcome Measure   Help from another person eating meals?: Total Help from another person taking care of personal grooming?: Total Help from another person toileting,  which includes using toliet, bedpan, or urinal?: Total Help from another person bathing (including washing, rinsing, drying)?: Total Help from another person to put on and taking off regular upper body clothing?: Total Help from another person to put on and taking off regular lower body clothing?: Total 6 Click Score: 6    End of Session Equipment Utilized During Treatment: Oxygen  OT Visit Diagnosis: Unsteadiness on feet (R26.81);Other abnormalities of gait and mobility (R26.89);Muscle weakness (generalized) (M62.81)   Activity Tolerance Patient limited by fatigue;Patient limited by lethargy   Patient Left in bed;with call bell/phone within reach;with family/visitor present   Nurse Communication Mobility status        Time: 1547(1547)-1604 OT Time Calculation (min): 17 min  Charges: OT General Charges $OT Visit: 1 Visit OT Treatments $Therapeutic Exercise: 8-22 mins   Brynn, OTR/L  Acute Rehabilitation Services Pager: [phone] Office: [phone] .    Jeri Modena 09/05/2019, 4:12 PM

## 2019-09-05 NOTE — Progress Notes (Signed)
[facility] Progress Note  Subjective: seen in room, alert today and interacting w/ head nods  Vitals:   09/05/19 1500 09/05/19 1515 09/05/19 1530 09/05/19 1542  BP: (!) 145/70 135/73 (!) 148/74 (!) 148/74  Pulse: 86 100 79 77  Resp: (!) 23 (!) 25 (!) 24 (!) 23  Temp:    98.9 F (37.2 C)  TempSrc:    Oral  SpO2: 99% 96% 99% 99%  Weight:      Height:        Inpatient medications: . aspirin  325 mg Per Tube Daily  . atorvastatin  40 mg Oral q1800  . B-complex with vitamin C  1 tablet Per Tube Daily  . chlorhexidine  15 mL Mouth/Throat BID  . Chlorhexidine Gluconate Cloth  6 each Topical Daily  . Chlorhexidine Gluconate Cloth  6 each Topical Q0600  . Chlorhexidine Gluconate Cloth  6 each Topical Q0600  . darbepoetin (ARANESP) injection - NON-DIALYSIS  100 mcg Subcutaneous Q Wed-1800  . feeding supplement (PRO-STAT SUGAR FREE 64)  30 mL Per Tube QID  . Gerhardt's butt cream   Topical TID  . heparin injection (subcutaneous)  5,000 Units Subcutaneous Q8H  . mouth rinse  15 mL Mouth Rinse 10 times per day  . midodrine  10 mg Per Tube TID WC  . pantoprazole sodium  40 mg Per Tube Daily  . sucralfate  1 g Oral Q6H   . sodium chloride Stopped (09/01/19 0000)  .  ceFAZolin (ANCEF) IV Stopped (09/04/19 2326)  . dextrose Stopped (08/16/19 1623)  . feeding supplement (NEPRO CARB STEADY) 45 mL/hr at 09/05/19 0600   sodium chloride, acetaminophen (TYLENOL) oral liquid 160 mg/5 mL, dextrose, heparin, heparin, heparin, heparin, heparin, metoprolol tartrate, pneumococcal 23 valent vaccine, polyethylene glycol   Exam:   Trach collar, L hemiparesis   L IJ temp cath   No jvd   Chest clear ant / lat, R chest tube in place    Cor reg no RG     Abd soft ntnd no ascites     Ext 2+ bilat LUE edema, no LE edema    HD here - needs 4h, hep yes    Assessment/Plan:  AKI/CKD stage 3- in setting of covid PNA and has remained oliguric/anuric. Started on CVVHD 08/07/19-08/24/19,  now on iHD. TTS schedule, HD tomorrow.   Volume - wt's and BP's up some, will pull 1-2 L on HD tomorrow  AMS - more alert today, wonder if uremia part of the problem. BUN better today.   New cerebral and cerebellar strokes- per primary svc  COVID PNA- treated with remdesivir, dexamethasone, and convalescent plasma. Most recent covid test negative x 2 on 08/24/19 and 08/25/19  VDRF - off vent, on trach collar per PCCM  Sepsis/shock- resolved, on midodrine now  Right pneumothorax/emphyema/necrotic RLL- s/p chest tube with air leak. Per CT Surgery he is not a candidate for VATS  A fib/RVR- per PCCM  GIB- acute s/p EGD which showed gastric ulcer with stigmata of recent bleeding s/p injection and clipping. Able to tolerate low dose heparin with CVVHD before stopping it  MSSA bacteremia- treated  Acinetobacter and pseudomonal pna- sp abx course   Rob Kayliegh Boyers 09/05/2019, 4:08 PM  Iron/TIBC/Ferritin/ %Sat    Component Value Date/Time   IRON 71 08/20/2019 1643   TIBC 217 (L) 08/20/2019 1643   FERRITIN 681 (H) 08/06/2019 0610   IRONPCTSAT 33 08/20/2019 1643   Recent Labs  Lab 08/30/19 0553  09/02/19  9485  09/05/19 0922  NA 136   < >  --    < > 134*  K 5.7*   < >  --    < > 3.9  CL 98   < >  --    < > 93*  CO2 21*   < >  --    < > 26  GLUCOSE 121*   < >  --    < > 100*  BUN 136*   < >  --    < > 80*  CREATININE 6.09*   < >  --    < > 4.44*  CALCIUM 8.8*   < >  --    < > 8.7*  PHOS 9.7*  --   --   --   --   INR  --   --  1.2  --   --    < > = values in this interval not displayed.   No results for input(s): AST, ALT, ALKPHOS, BILITOT, PROT in the last 168 hours. Recent Labs  Lab 09/05/19 0922  WBC 14.0*  HGB 6.8*  HCT 21.8*  PLT 309

## 2019-09-05 NOTE — Progress Notes (Signed)
NAME:  [name], MRN:  [id number], DOB:  August 06, 1951, LOS: 11 ADMISSION DATE:  07/25/2019, CONSULTATION DATE:  10/3 REFERRING MD:  Nadara Mustard, CHIEF COMPLAINT:  Dyspnea   Brief History   68 y/o male diagnosed with COVID on 9/29 presented to the [facility] ED on 10/2 with dyspnea, hypoxemia requiring intubation.  Admitted to [facility], developed AKI requiring CRRT, pneumothorax requiring chest tube and had a large bronchopleural fistula treated for 10 days with a bronchial blocker.  Tracheostomy placed on 10/30.  To [facility] 11/1 for IHD.  Chest tube to suction, improvement in air leak.  Waterseal 11/13  Past Medical History  GERD  Significant Hospital Events   10/03 Admit to [facility] from [facility] ER, start decadron and remdesivir, given tociluzimab; prone positioning 10/04 convalescent plasma 10/05 stop prone positioning 10/06 convalescent plasma; prone positioning again 10/09 start ABx 10/10 MSSA bacteremia, CVL d/ced; ID consulted; increase OG tube outpt 10/11 vent weaning trial started 10/13 fever, pneumothorax, pig tail chest tube placed 10/14 worsening hypotension and renal fx >> consulted nephrology; worsening PTX >> replaced chest tube; GNR in sputum >> ABx changed 10/15 start CRRT; endobronchial blocker placed for persistent air leak 10/16 endobronchial blocker repositioned, changed chest tube to water seal; melana with ABLA >> GI consulted; transfuse PRBC 10/17 persistent air leak, increased WOB; start nimbex gtt >> air leak decreased; EGD; A fib with RVR >> start amiodarone 10/18 transfuse PRBC; GI s/o 10/20 resume heparin gtt 10/21 stopped nimbex, chest tube to suction, stopped heparin drip because Hgb dropped again 10/22 TPA in chest tube, repositioned endobronchial blocker 10/23 deflated endobronchial balloon 10/24 small air leak  10/25 removed bronchial blocker 10/30 tracheostomy  11/1 D/C CRRT 11/2 Transfer to [facility] 11/3 Started on IHD 11/7 trach sutures removed 11/4 present, weaning on ATC  11/11 required phenylephrine to tolerate iHD 11/13 chest tube to waterseal  Consults:  ID Nephrology Gastroenterology PCCM  Procedures:  10/2 R IJ CVL >> 10/10 10/13 Rt pig tail chest tube >> 10/14 10/14 R 33F chest tube >> 10/14 L IJ HD cath >> 11/10 10/30 tracheostomy >>  11/10 tunneled HD cath >>  Significant Diagnostic Tests:  10/2 CT angiogram chest >> extensive bilateral airspace disease predominantly posterior 10/8 doppler legs b/l >> no DVT 10/15 renal u/s >> normal 10/17 EGD >> non bleeding gastric ulcer 10/19 Echo >> EF 60 to 65%, hyperdynamic LV with small LVOT gradient 10/22 CT chest >> large collection of air in pleural space with fluid, pneumonia bilaterally R>L endobronchial blocker in place, chest tube anterior 10/31 CXR >> persistent small right pneumothorax despite chest in adequate position MRI brain 11/3 >> small acute to subacute infarcts involving bilateral cerebral cortex and right cerebellum.  Large b/l mastoid effusions  Micro Data:  9/20 SARS COV 2 >> POSITIVE 10/2 blood >> negative 10/9 blood >> MSSA 2/4 10/9 resp >> MSSA, Pseudomonas 10/13 blood >> negative 10/21 bronch wash bacterial >> pseduomonas, acinetobacter baumannii 10/21 bronch wash fungal >  10/21 bronch wash aspergillus antigen> negative 10/24 bronchoscopy wash> pseudomonas 10/28 Blood cx>>> negative  10/28 Sputum cx>>> Pseudomonas 11/1 SARS COV 2 > negative 11/2 SARS CoV2 >> negative   Antimicrobials:  10/3 remdesivir > 10/6 10/3 actemra 10/3 decadron > 10/12 10/4 convalescent plasma 10/6 convalescent plasma  10/9 vancomycin > 10/10 10/9 cefepime > 10/10 10/10 ancef > 10/13 10/13 cefepime >10/20 10/14 anidulofungin > 10/15  10/20 ancef > 10/23 10/23 meropenem > >>>off  10/21 anidulofungin> 10/22 10/22 voriconazole > 10/23  10/28 Merrem >>11/2 10/28 Vanc >>10/30  10/31 Ancef >> Tentative stop date 11/23   Interim history/subjective:  Working with speech  therapy No air leak on chest tube, no interval change tiny right base pneumothorax and patchy pulmonary infiltrates  Objective   Blood pressure (!) 160/85, pulse 99, temperature 99.5 F (37.5 C), temperature source Axillary, resp. rate (!) 26, height 5\' 10"  (1.778 m), weight 84 kg, SpO2 99 %.    FiO2 (%):  [28 %-40 %] 28 %   Intake/Output Summary (Last 24 hours) at 09/05/2019 0936 Last data filed at 09/05/2019 0800 Gross per 24 hour  Intake 1210 ml  Output 225 ml  Net 985 ml   Filed Weights   09/02/19 1230 09/02/19 1240 09/02/19 1635  Weight: 83 kg 83 kg 84 kg    Examination: Unchanged:  General: Chronically ill-appearing man, lying in bed in no distress. HEENT: Trach in place, some tan secretions that he is able to completely clear.  Oropharynx otherwise clear Neuro: Wakes to voice, nods to questions, globally weak.  Followed commands on the right but not on the left CV: Tachycardic, regular, no murmur PULM: Coarse bilaterally, no wheeze, chest tube -20 cmH2O suction without an air leak GI: Positive bowel sounds, mild distention, nontender Extremities: No edema Skin: No rash  Assessment & Plan:   Acute respiratory failure with hypoxemia - 2/2 COVID-19 (s/p remdsevir, actemra, convalescent plasma, steroids)  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  Now with persistent airleak / BPF despite endobronchial blocker placement (removed 10/25 due to MSSA bacteremia) P: -Continue ATC ad lib. -Working with speech therapy, PMV trials -Appreciate Dr. Roxan Hockey management chest tube, planning waterseal today.  Follow chest x-ray.  Probably will follow for a few days, hold off on removing chest tube until beginning of next week if his chest x-ray remained stable  ESRD now on iHD P: -Intermittent HD.  Appreciate nephrology management  Bilateral cerebral cortex and right cerebellum small acute to subacute infarcts Neuromuscular  weakness/encephalopathy Persistent shock/vasoplegia -improved P: -SBP goal 120-140, has intermittently required pressors to maintain -Continue to push PT/OT  MSSA bacteremia -TTE negative.  P: -Plan for cefazolin 6 weeks total, stop date 11/23  Anemia of chronic disease with GI bleed and gastric ulcer  -PPI daily -Follow CBC or for any evidence of active blood loss   Rest per primary team   Best practice:  Diet: tube feeding Pain/Anxiety/Delirium protocol (if indicated): fentanyl for pain. VAP protocol (if indicated): yes DVT prophylaxis: subcutaneous heparin GI prophylaxis: Pantoprazole for stress ulcer prophylaxis Glucose control: SSI Mobility: PT  Code Status: DNR if arrests Family Communication:  Disposition: ICU pending pressor liberation   Critical care :  N/A   Signature:   Baltazar Apo, MD, PhD 09/05/2019, 9:36 AM [facility] [phone] or if no answer [phone]

## 2019-09-05 NOTE — Progress Notes (Signed)
  Speech Language Pathology Treatment: Cognitive-Linquistic;Passy Muir Speaking valve  Patient Details Name: [name] MRN: [id number] DOB: 07-03-1951 Today's Date: 09/05/2019 Time: 6712-4580 SLP Time Calculation (min) (ACUTE ONLY): 11 min  Assessment / Plan / Recommendation Clinical Impression  COMMUNICATION Pt more attentive and responsive as compared to 11/11. Pt followed 1 step commands with 100% accuracy.  Pt answered personal, paired yes/no questions with 100% accuracy.  Pt answered general knowledge and comparison y/n questions with 80% accuracy.  There was no response for one question and pt needed increasing cues to attend to SLP and provide response.  Pt did not attempt to communicate verbally during this session despite cuing.  PMV Pt tolerated PMV placement for 6 minutes.  RR increased to 32 and monitor signaled VTach (incorrectly per RN).  Although pt did not appear to be in distress, valve was removed at this time d/t change in vitals.  During valve placement, pt did not attempt to phone despite max cues.  Weak, non productive cough noted with placement of PMV and on removal, pt coughed out a large amount of thick, red-tinged secretions from trach.  Suction was used to remove secretions.   HPI HPI: 68 year old with a history of GERD and BPH who presented to [facility], ED with S OB and was found to be hypoxic.  He was initially diagnosed with COVID-19 September 20.  1 to 2 days prior to his presentation he developed worsening shortness of breath with anorexia. Admitted on 10/3, intubated.  EGD on 10/17 showed normal esophagus but excessive gastric fluid. CT scan of the chest on 10/21 showed findings consistent with an empyema as well as persistent pneumothorax.  Bedside bronchoscopy revealed material in the airway. Trached on 10/30. On ATC by 10/31.Additionally MRI on 11/4 showed There is cortical restricted diffusion posteriorly in the right frontal lobe consistent with acute  infarct. Additional smaller regions of diffusion abnormality are present in the posterior left frontal lobe, anterior left frontal lobe, and right greater than left medial parietal lobes as well as right cerebellum.       SLP Plan  Continue with current plan of care       Recommendations         Patient may use Passy-Muir Speech Valve: Intermittently with supervision;During all therapies with supervision PMSV Supervision: Full MD: Please consider changing trach tube to : Smaller size;Cuffless         Oral Care Recommendations: Oral care QID Follow up Recommendations: 24 hour supervision/assistance SLP Visit Diagnosis: Aphonia (R49.1);Cognitive communication deficit (R41.841) Plan: Continue with current plan of care       [place], [place], [place] Office: [phone]; Pager [phone]): [phone] (via text page app only) 09/05/2019, 9:41 AM

## 2019-09-06 ENCOUNTER — Inpatient Hospital Stay (HOSPITAL_COMMUNITY): Payer: Medicare HMO

## 2019-09-06 DIAGNOSIS — D62 Acute posthemorrhagic anemia: Secondary | ICD-10-CM | POA: Clinically undetermined

## 2019-09-06 DIAGNOSIS — I639 Cerebral infarction, unspecified: Secondary | ICD-10-CM | POA: Diagnosis not present

## 2019-09-06 DIAGNOSIS — U071 COVID-19: Secondary | ICD-10-CM | POA: Diagnosis not present

## 2019-09-06 DIAGNOSIS — G931 Anoxic brain damage, not elsewhere classified: Secondary | ICD-10-CM | POA: Clinically undetermined

## 2019-09-06 DIAGNOSIS — N179 Acute kidney failure, unspecified: Secondary | ICD-10-CM | POA: Diagnosis not present

## 2019-09-06 DIAGNOSIS — R7989 Other specified abnormal findings of blood chemistry: Secondary | ICD-10-CM

## 2019-09-06 DIAGNOSIS — A048 Other specified bacterial intestinal infections: Secondary | ICD-10-CM | POA: Diagnosis present

## 2019-09-06 DIAGNOSIS — A498 Other bacterial infections of unspecified site: Secondary | ICD-10-CM

## 2019-09-06 DIAGNOSIS — T859XXA Unspecified complication of internal prosthetic device, implant and graft, initial encounter: Secondary | ICD-10-CM

## 2019-09-06 DIAGNOSIS — J9601 Acute respiratory failure with hypoxia: Secondary | ICD-10-CM | POA: Diagnosis not present

## 2019-09-06 DIAGNOSIS — R7881 Bacteremia: Secondary | ICD-10-CM

## 2019-09-06 DIAGNOSIS — B9561 Methicillin susceptible Staphylococcus aureus infection as the cause of diseases classified elsewhere: Secondary | ICD-10-CM

## 2019-09-06 LAB — GLUCOSE, CAPILLARY
Glucose-Capillary: 102 mg/dL — ABNORMAL HIGH (ref 70–99)
Glucose-Capillary: 106 mg/dL — ABNORMAL HIGH (ref 70–99)
Glucose-Capillary: 108 mg/dL — ABNORMAL HIGH (ref 70–99)
Glucose-Capillary: 110 mg/dL — ABNORMAL HIGH (ref 70–99)
Glucose-Capillary: 117 mg/dL — ABNORMAL HIGH (ref 70–99)
Glucose-Capillary: 98 mg/dL (ref 70–99)

## 2019-09-06 LAB — CBC
HCT: 24.6 % — ABNORMAL LOW (ref 39.0–52.0)
Hemoglobin: 8 g/dL — ABNORMAL LOW (ref 13.0–17.0)
MCH: 29.2 pg (ref 26.0–34.0)
MCHC: 32.5 g/dL (ref 30.0–36.0)
MCV: 89.8 fL (ref 80.0–100.0)
Platelets: 296 10*3/uL (ref 150–400)
RBC: 2.74 MIL/uL — ABNORMAL LOW (ref 4.22–5.81)
RDW: 15.6 % — ABNORMAL HIGH (ref 11.5–15.5)
WBC: 15.6 10*3/uL — ABNORMAL HIGH (ref 4.0–10.5)
nRBC: 0.2 % (ref 0.0–0.2)

## 2019-09-06 LAB — BPAM RBC
Blood Product Expiration Date: 202012142359
ISSUE DATE / TIME: 202011131318
Unit Type and Rh: 5100

## 2019-09-06 LAB — TYPE AND SCREEN
ABO/RH(D): O POS
Antibody Screen: NEGATIVE
Unit division: 0

## 2019-09-06 MED ORDER — HEPARIN SODIUM (PORCINE) 1000 UNIT/ML DIALYSIS: 3000.0000 [IU] | INTRAMUSCULAR | Status: DC | PRN

## 2019-09-06 MED ORDER — HEPARIN SODIUM (PORCINE) 1000 UNIT/ML IJ SOLN
INTRAMUSCULAR | Status: AC
  Filled 2019-09-06: qty 7

## 2019-09-06 MED ORDER — AMLODIPINE BESYLATE 5 MG PO TABS: 2.5000 mg | ORAL_TABLET | Freq: Two times a day (BID) | ORAL | Status: DC

## 2019-09-06 NOTE — Progress Notes (Addendum)
[facility]  PROGRESS NOTE  [name] HYW:[id number] DOB: May 08, 1951 DOA: 07/25/2019 PCP: Kathyrn Drown, MD Admit date - 07/25/2019   Admitting Physician Kristopher Oppenheim, DO  Outpatient Primary MD for the patient is Kathyrn Drown, MD  LOS - [phone] Brief Narrative   [name] is a 68 y.o. year old male with medical history significant for GERD and BPH who initially presented on 10/2//20 with worsening SOB x 5 days after recently testing positive for COVID-19 (07/22/19, in Epic). EMS found him to be tachypneic with profound hypoxia to 40s  requiring nonrebreather, upon arrival to [facility] ED he required emergent intubation. CXR and CTA chest showed multifocal consolidation, and ground glass opacity, negative for PE. Marland Kitchen Patient was admitted to [place] for Acute hypoxic respiratory failure secondary to severe ARDS from COVID pneumonia and started on decadron, remdesevir and toclilizumab.  Prolonged Hospital course complicated by NIOEV-03 pneumonia, ARDS, AKI requiring CRRT, encephalopathy, pneumothorax complicated by persistent air leak and bronchopleural fistula requiring endobronchial blocker and Pleur-evac, prolonged mechanical ventilation status post tracheostomy on 10/30, MSSA bacteremia with negative echocardiogram, intermittent pressor support required with dialysis, melena with acute blood loss anemia requiring transfusion, IV protonix, empiric octreotide(previous history of significant ethanol use) and EGD with gastric ulcer and nonbleeding visible vessel clipped, transferred to [place] on 11/3 for intermittent HD, subacute right cerebral/cerebellar infarcts on MRI treated with aspirin on 11/4.  Subjective  [name] today had no acute events. His wife is at bedside reporting he was able to say I love and emphasize using the bathroom.  A & P    1. Acute hypoxic respiratory failure with severe ARDS secondary to Covid pneumonia.  Completed  remdesivir/Decadron/actemra/convalescent plasma therapy.  ARDS resolved. Covid test negative x2 (11/1, 11/2).  Stable on trach collar per PCCM chronically requiring 5 L maintaining normal oxygen saturation currently.  2. AKI on CKD Stage 3( baseline cr around 1.5),likely secondary to ATN related to septic shock. requiring intermittent HD TThsS. Volume up on exam today, HD today per nephrology  3. Septic shock, resolved, persistent hypotension.  Requiring as needed pressors during dialysis, continue midodrine 10 mg 3 times daily  4. Right pneumothorax/empyema/necrotic right lower lobe/bronchopleural fistula with large air leak, stable.  Chest tube in place, CT surgery following, placed to waterseal, closely monitor, serial cxr before removing chest tube likely next week per PCCM   5. Acute on chronic anemia secondary to gastric ulcer status post EGD with culprit vessel clipping.   hgb improved to 8.4 after 1 U transfusion on 11/13, closely monitor.  Continue PPI twice daily.  6. H. Pylori positive by EGD biopsy. GI recommends waiting unitl clinically stable after discharge and starting 2 week regimen to prevent ulcer recurrence. GI (per Dr. Cristina Gong 10/27) to follow up in 2-3 months from hospitalization to arrange this and possible f/u EGD.   7. Small bilateral cerebral/right cerebellum infarcts(11/3) likely due to hypotension secondary to dialysis and sepsis and deconditioning. PAF is also a potential etiology. Able to squeeze right hand, wiggle right toes, track to the right.  Neurology recommends aspirin 325 mg, permissive hypertension (goal BP 120-140), no anticoagulation given history of GI bleed. F/u 4 wks after discharge at stroke clinic NP at [facility].  8. Anoxic brain injury. Per nephrology bilateral fronal gyrus ribboningon MRI (11/3)consistent with cortical ischemia consistent with anoxic brain injury. Has had frequent episodes of hypotension. Goal Bp 120-140  9. Atrial fibrillation, currently  rate controlled. unable to fully anticoagulate  due to recent GI bleeding, continue aspirin, Briefly required amiodarone earlier in hospitalization. Monitor on telemetry.  10. Sinus tachycardia, improved. Likely related to previously low hgb.   11. MSSA bacteremia, continue cefazolin, stop date 11/23 (6 total weeks), TTE negative but still high concern for infective endocarditis   12. Pseudomonas and Acinetobacter in BAL specimen, resolved.  Completed 10-day treatment of meropenem  13. Elevated D-dimer. > 20 on 10/19, down to 8/83 on 10/23 no full anticoagulation with prior GI bleed. Dopplers negative for DVT on 10/8. Likely related to right lung empyema.   14. Pressure injuries, not present on admission.  Pressure Injury 08/16/19 Lip Medial;Lower Stage I -  Intact skin with non-blanchable redness of a localized area usually over a bony prominence. healing  (Active)  08/16/19 2000  Location: Lip  Location Orientation: Medial;Lower  Staging: Stage I -  Intact skin with non-blanchable redness of a localized area usually over a bony prominence.  Wound Description (Comments): healing   Present on Admission: No     Pressure Injury 08/24/19 Sacrum Right;Left;Mid Stage II -  Partial thickness loss of dermis presenting as a shallow open ulcer with a red, pink wound bed without slough.  (Active)  08/24/19 0800  Location: Sacrum  Location Orientation: Right;Left;Mid  Staging: Stage II -  Partial thickness loss of dermis presenting as a shallow open ulcer with a red, pink wound bed without slough.  Wound Description (Comments): -- (stage 11 mid sacrum)  Present on Admission: No    15. Goals of care, severely guarded prognosis.  Patient family currently elected to continue full scope of medical treatment, appreciate palliative care team assistance.     Family Communication  : Spoke with His wife Mrs. Rossetti at bedside on 11/14  Code Status : DNR  Disposition Plan  : Guarded prognosis, continue  medical management, goals of care discussion,  monitoring chest tube,   Consults  : ID, nephrology, GI, neurology PCCM, Palliative  Procedures  :  Tunneled HD cath, 11/10 HD, 11/3 Tracheostomy 10/30 EGD 10/17. Gastric ulcer, nonbleeding visible vessel clipped CRRT 10/15 Bronchoscopy with endobronchial ballon, 10/15, repeat 10/16 Right-sided chest tube, 10/14 Right pigtail chest tube--for right PTX 10/13-2/14 Right IJ 10/2-10/10 ETT 10/2        COVID-19 specific Treatment:  Decadron 10/2 > 10/12  Remdesivir 10/2 > 10/6  Actemra 10/2  Convalescent plasma 10/3   DVT Prophylaxis  : SCDs  Lab Results  Component Value Date   PLT 296 09/06/2019    Diet :  Diet Order            Diet NPO time specified  Diet effective midnight               Inpatient Medications Scheduled Meds:  aspirin  325 mg Per Tube Daily   atorvastatin  40 mg Oral q1800   B-complex with vitamin C  1 tablet Per Tube Daily   chlorhexidine  15 mL Mouth/Throat BID   Chlorhexidine Gluconate Cloth  6 each Topical Daily   Chlorhexidine Gluconate Cloth  6 each Topical Q0600   Chlorhexidine Gluconate Cloth  6 each Topical Q0600   Chlorhexidine Gluconate Cloth  6 each Topical Q0600   darbepoetin (ARANESP) injection - NON-DIALYSIS  100 mcg Subcutaneous Q Wed-1800   feeding supplement (PRO-STAT SUGAR FREE 64)  30 mL Per Tube QID   Gerhardt's butt cream   Topical TID   heparin injection (subcutaneous)  5,000 Units Subcutaneous Q8H   mouth rinse  15 mL Mouth Rinse 10 times per day   midodrine  10 mg Per Tube TID WC   pantoprazole sodium  40 mg Per Tube Daily   sucralfate  1 g Oral Q6H   Continuous Infusions:  sodium chloride Stopped (09/01/19 0000)    ceFAZolin (ANCEF) IV Stopped (09/05/19 2336)   dextrose Stopped (08/16/19 1623)   feeding supplement (NEPRO CARB STEADY) 1,000 mL (09/06/19 1331)   PRN Meds:.sodium chloride, acetaminophen (TYLENOL) oral liquid 160 mg/5 mL, dextrose,  heparin, heparin, heparin, heparin, heparin, [START ON 09/07/2019] heparin, metoprolol tartrate, pneumococcal 23 valent vaccine, polyethylene glycol  Antibiotics  :   Anti-infectives (From admission, onward)   Start     Dose/Rate Route Frequency Ordered Stop   08/26/19 2200  ceFAZolin (ANCEF) IVPB 1 g/50 mL premix     1 g 100 mL/hr over 30 Minutes Intravenous Every 24 hours 08/24/19 1021 09/15/19 2359   08/24/19 1100  meropenem (MERREM) 1 g in sodium chloride 0.9 % 100 mL IVPB     1 g 200 mL/hr over 30 Minutes Intravenous Every 24 hours 08/24/19 1021 08/25/19 1156   08/23/19 1100  ceFAZolin (ANCEF) IVPB 2g/100 mL premix  Status:  Discontinued     2 g 200 mL/hr over 30 Minutes Intravenous Every 12 hours 08/23/19 1048 08/24/19 1021   08/21/19 1700  vancomycin (VANCOCIN) IVPB 750 mg/150 ml premix  Status:  Discontinued     750 mg 150 mL/hr over 60 Minutes Intravenous Every 24 hours 08/21/19 0843 08/23/19 1048   08/21/19 1400  vancomycin (VANCOCIN) IVPB 750 mg/150 ml premix  Status:  Discontinued     750 mg 150 mL/hr over 60 Minutes Intravenous Every 24 hours 08/20/19 1146 08/20/19 1218   08/21/19 1000  meropenem (MERREM) 1 g in sodium chloride 0.9 % 100 mL IVPB  Status:  Discontinued     1 g 200 mL/hr over 30 Minutes Intravenous Every 12 hours 08/20/19 1535 08/23/19 1048   08/21/19 0413  meropenem (MERREM) 500 mg in sodium chloride 0.9 % 100 mL IVPB  Status:  Discontinued     500 mg 200 mL/hr over 30 Minutes Intravenous Every 24 hours 08/20/19 1245 08/20/19 1535   08/20/19 1330  vancomycin (VANCOCIN) 1,750 mg in sodium chloride 0.9 % 500 mL IVPB     1,750 mg 250 mL/hr over 120 Minutes Intravenous  Once 08/20/19 1222 08/20/19 1619   08/20/19 1300  vancomycin (VANCOCIN) 1,250 mg in sodium chloride 0.9 % 250 mL IVPB  Status:  Discontinued     1,250 mg 166.7 mL/hr over 90 Minutes Intravenous  Once 08/20/19 1146 08/20/19 1218   08/20/19 1224  vancomycin variable dose per unstable renal  function (pharmacist dosing)  Status:  Discontinued      Does not apply See admin instructions 08/20/19 1225 08/21/19 1152   08/15/19 1600  meropenem (MERREM) 1 g in sodium chloride 0.9 % 100 mL IVPB  Status:  Discontinued     1 g 200 mL/hr over 30 Minutes Intravenous Every 12 hours 08/15/19 1543 08/20/19 1245   08/15/19 1000  voriconazole (VFEND) tablet 200 mg  Status:  Discontinued     200 mg Per Tube Every 12 hours 08/14/19 1148 08/15/19 1134   08/14/19 1600  anidulafungin (ERAXIS) 100 mg in sodium chloride 0.9 % 100 mL IVPB  Status:  Discontinued     100 mg 78 mL/hr over 100 Minutes Intravenous Every 24 hours 08/13/19 1645 08/14/19 1148   08/14/19 1300  voriconazole (VFEND)  tablet 400 mg     400 mg Per Tube Every 12 hours 08/14/19 1148 08/14/19 2217   08/13/19 1700  anidulafungin (ERAXIS) 200 mg in sodium chloride 0.9 % 200 mL IVPB     200 mg 78 mL/hr over 200 Minutes Intravenous  Once 08/13/19 1645 08/13/19 2030   08/13/19 0200  ceFAZolin (ANCEF) IVPB 2g/100 mL premix  Status:  Discontinued     2 g 200 mL/hr over 30 Minutes Intravenous Every 12 hours 08/11/19 1512 08/15/19 1543   08/12/19 0200  ceFEPIme (MAXIPIME) 2 g in sodium chloride 0.9 % 100 mL IVPB     2 g 200 mL/hr over 30 Minutes Intravenous Every 12 hours 08/11/19 1509 08/12/19 1341   08/07/19 2200  ceFEPIme (MAXIPIME) 2 g in sodium chloride 0.9 % 100 mL IVPB  Status:  Discontinued     2 g 200 mL/hr over 30 Minutes Intravenous Every 12 hours 08/07/19 1540 08/11/19 1509   08/07/19 1000  anidulafungin (ERAXIS) 100 mg in sodium chloride 0.9 % 100 mL IVPB  Status:  Discontinued     100 mg 78 mL/hr over 100 Minutes Intravenous Every 24 hours 08/06/19 0934 08/08/19 1203   08/07/19 0800  ceFEPIme (MAXIPIME) 2 g in sodium chloride 0.9 % 100 mL IVPB  Status:  Discontinued     2 g 200 mL/hr over 30 Minutes Intravenous Every 24 hours 08/06/19 1036 08/07/19 1540   08/06/19 1000  anidulafungin (ERAXIS) 200 mg in sodium chloride 0.9 %  200 mL IVPB    Note to Pharmacy: please   200 mg 78 mL/hr over 200 Minutes Intravenous Every 24 hours 08/06/19 0925 08/06/19 1600   08/06/19 0800  ceFEPIme (MAXIPIME) 2 g in sodium chloride 0.9 % 100 mL IVPB  Status:  Discontinued     2 g 200 mL/hr over 30 Minutes Intravenous Every 12 hours 08/06/19 0733 08/06/19 1036   08/02/19 2200  ceFAZolin (ANCEF) IVPB 2g/100 mL premix  Status:  Discontinued     2 g 200 mL/hr over 30 Minutes Intravenous Every 8 hours 08/02/19 1338 08/06/19 0733   08/02/19 1400  vancomycin (VANCOCIN) 1,250 mg in sodium chloride 0.9 % 250 mL IVPB  Status:  Discontinued     1,250 mg 166.7 mL/hr over 90 Minutes Intravenous Every 24 hours 08/01/19 1320 08/02/19 1332   08/01/19 1330  vancomycin (VANCOCIN) 2,000 mg in sodium chloride 0.9 % 500 mL IVPB     2,000 mg 250 mL/hr over 120 Minutes Intravenous  Once 08/01/19 1158 08/01/19 1636   08/01/19 1300  ceFEPIme (MAXIPIME) 2 g in sodium chloride 0.9 % 100 mL IVPB  Status:  Discontinued     2 g 200 mL/hr over 30 Minutes Intravenous Every 12 hours 08/01/19 1158 08/02/19 1332   07/26/19 1600  remdesivir 100 mg in sodium chloride 0.9 % 250 mL IVPB     100 mg 500 mL/hr over 30 Minutes Intravenous Every 24 hours 07/26/19 0132 07/29/19 1823   07/26/19 0215  remdesivir 200 mg in sodium chloride 0.9 % 250 mL IVPB     200 mg 500 mL/hr over 30 Minutes Intravenous Once 07/26/19 0132 07/26/19 0520       Objective   Vitals:   09/06/19 1207 09/06/19 1215 09/06/19 1230 09/06/19 1245  BP:  (!) 151/78 (!) 151/76 (!) 152/80  Pulse:  93 83 90  Resp:  (!) 26 (!) 26 (!) 27  Temp: 98.6 F (37 C)     TempSrc: Oral  SpO2:  92% 98% 98%  Weight:      Height:        SpO2: 98 % O2 Flow Rate (L/min): 5 L/min FiO2 (%): 28 %  Wt Readings from Last 3 Encounters:  09/02/19 84 kg  11/08/17 96.2 kg  09/04/16 94.3 kg     Intake/Output Summary (Last 24 hours) at 09/06/2019 1405 Last data filed at 09/06/2019 1200 Gross per 24 hour   Intake 1687.92 ml  Output 300 ml  Net 1387.92 ml    Physical Exam:  Awake, opens eyes to voice, nods appropriately to questions LIJ temp cath R chest tube in place,Trach collar in place, 5 L, normal oxygen saturation with no respiratory distress Regular rate and rhythm Rectal tube in place 2+ bilateral upper extremity edema Boots in place on lower extremities Able to wiggle right toes and squeeze right hand on command ( unchanged), L hemiparesis    I have personally reviewed the following:   Data Reviewed:  CBC Recent Labs  Lab 09/03/19 0440 09/04/19 0730 09/05/19 0922 09/05/19 2016 09/06/19 0951  WBC 8.7 12.1* 14.0* 13.3* 15.6*  HGB 7.6* 7.4* 6.8* 8.4* 8.0*  HCT 24.2* 23.3* 21.8* 26.1* 24.6*  PLT 304 331 309 290 296  MCV 91.3 91.0 92.0 90.6 89.8  MCH 28.7 28.9 28.7 29.2 29.2  MCHC 31.4 31.8 31.2 32.2 32.5  RDW 16.2* 15.6* 15.5 15.9* 15.6*    Chemistries  Recent Labs  Lab 09/01/19 0356 09/02/19 0420 09/03/19 0440 09/04/19 0730 09/05/19 0922  NA 134* 136 135 135 134*  K 5.0 5.9* 4.2 4.5 3.9  CL 98 100 98 94* 93*  CO2 20* 19* _0 GLUCOSE 118* 117* 118* 103* 100*  BUN 145* 183* 75* 112* 80*  CREATININE 6.49* 7.44* 4.26* 5.76* 4.44*  CALCIUM 8.6* 8.9 8.3* 8.7* 8.7*   ------------------------------------------------------------------------------------------------------------------ No results for input(s): CHOL, HDL, LDLCALC, TRIG, CHOLHDL, LDLDIRECT in the last 72 hours.  Lab Results  Component Value Date   HGBA1C 6.1 (H) 07/26/2019   ------------------------------------------------------------------------------------------------------------------ No results for input(s): TSH, T4TOTAL, T3FREE, THYROIDAB in the last 72 hours.  Invalid input(s): FREET3 ------------------------------------------------------------------------------------------------------------------ No results for input(s): VITAMINB12, FOLATE, FERRITIN, TIBC, IRON, RETICCTPCT in  the last 72 hours.  Coagulation profile Recent Labs  Lab 09/02/19 0649  INR 1.2    No results for input(s): DDIMER in the last 72 hours.  Cardiac Enzymes No results for input(s): CKMB, TROPONINI, MYOGLOBIN in the last 168 hours.  Invalid input(s): CK ------------------------------------------------------------------------------------------------------------------ No results found for: BNP  Micro Results No results found for this or any previous visit (from the past 240 hour(s)).  Radiology Reports Dg Abd 1 View  Result Date: 08/09/2019 CLINICAL DATA:  ARDS from COVID-19. EXAM: ABDOMEN - 1 VIEW COMPARISON:  August 06, 2019 FINDINGS: No free air, portal venous gas, or pneumatosis identified on supine imaging. An NG tube terminates in the stomach. Foreign body projects over the midline of the lower pelvis. No other abnormalities. IMPRESSION: 1. The foreign body projecting over the pelvis may be on or in the patient. Recommend clinical correlation. 2. No other acute abnormalities. Electronically Signed   By: Dorise Bullion III M.D   On: 08/09/2019 18:50   Ct Chest Wo Contrast  Result Date: 09/01/2019 CLINICAL DATA:  Coronavirus infection. Chest tube placement for right pneumothorax. EXAM: CT CHEST WITHOUT CONTRAST TECHNIQUE: Multidetector CT imaging of the chest was performed following the standard protocol without IV contrast. COMPARISON:  Chest radiography same day. CT chest  08/13/2019 FINDINGS: Cardiovascular: Heart size upper limits of normal. Minimal aortic atherosclerosis. No acute vascular finding visible. Mediastinum/Nodes: No mass or lymphadenopathy. Lungs/Pleura: Tracheostomy remains in place. Widespread patchy pulmonary infiltrates in the left lung persist, improved since late October. Better aeration in the left lower lobe. On the right, chest tube remains in place anteriorly, in direct communication with pleural air. Total amount of pleural air estimated at 5-10%. No evidence  that this is increasing. Widespread patchy pulmonary infiltrates as seen previously, with a large air cyst in the right lower lung measuring 9.5 x 7.7 x 7.1 cm. I think this is in the right middle lobe, but find it difficult to state that with certainty. Some chance this could be in the right lower lobe, less likely. In any case, this is likely subsequent to barotrauma. Upper Abdomen: Negative Musculoskeletal: Negative IMPRESSION: 1. Right chest tube remains in place anteriorly, in direct communication with pleural air. Total amount of pleural air estimated at 5-10%. 2. Widespread patchy pulmonary infiltrates, improved since late October. Better aeration in the left lower lobe. 3. Large air cyst in the right lower lung measuring 9.5 x 7.7 x 7.1 cm. This is presumably subsequent to barotrauma. This is slightly larger than on the study of 08/13/2019. 4. Tracheostomy remains in place. Aortic Atherosclerosis (ICD10-I70.0). Electronically Signed   By: Nelson Chimes M.D.   On: 09/01/2019 15:53   Ct Chest Wo Contrast  Result Date: 08/13/2019 CLINICAL DATA:  COVID-19 positivity, follow-up patchy infiltrates EXAM: CT CHEST WITHOUT CONTRAST TECHNIQUE: Multidetector CT imaging of the chest was performed following the standard protocol without IV contrast. COMPARISON:  07/25/2019 FINDINGS: Cardiovascular: Examination is somewhat limited due to lack of IV contrast. Atherosclerotic calcifications of the thoracic aorta are seen. No cardiac enlargement is noted. No aneurysmal dilatation is seen. Right-sided central line is noted extending into the cavoatrial junction. Left jugular central line is noted as well. Mediastinum/Nodes: Endotracheal tube and gastric catheter are seen. A blocking catheter is noted within the right lower lobe bronchi stable from prior exams. Thoracic inlet appears within normal limits. No sizable mediastinal or hilar adenopathy is noted. The esophagus is within normal limits as visualized. Lungs/Pleura:  The lungs demonstrate patchy infiltrates similar to that seen on prior CT although the degree of consolidation has improved in the interval from the prior exam. Persistent right lower lobe and left lower lobe consolidation is noted. Complicated pleural effusion is noted on the right. A considerable amount of air is noted within the pleural effusion as well as within the pleural space. Chest tube is noted anteriorly. The air is likely related to the recent bronchoscopy and manipulation of the bronchial occlusion tube. Upper Abdomen: Visualized upper abdomen shows gastric catheter within the stomach as well as multiple ligation clips within the distal stomach. Musculoskeletal: No chest wall mass or suspicious bone lesions identified. IMPRESSION: Improved aeration within the lungs when compared with the prior CT examination. There remains consolidation within the lower lobes bilaterally worse on the right than the left. Complicated pleural effusion on the right is noted with chest tube in place. The air within the pleural space is likely related to the recent manipulation of the bronchial occlusion tube. Tubes and lines as described above. Aortic Atherosclerosis (ICD10-I70.0). Electronically Signed   By: Inez Catalina M.D.   On: 08/13/2019 15:32   Mr Brain Wo Contrast  Result Date: 08/26/2019 CLINICAL DATA:  Altered level of consciousness. History of COVID-19. EXAM: MRI HEAD WITHOUT CONTRAST TECHNIQUE: Multiplanar, multiecho  pulse sequences of the brain and surrounding structures were obtained without intravenous contrast. COMPARISON:  Head CT 01/05/2014 FINDINGS: Brain: There is cortical restricted diffusion posteriorly in the right frontal lobe consistent with acute infarct. Additional smaller regions of diffusion abnormality are present in the posterior left frontal lobe, anterior left frontal lobe, and right greater than left medial parietal lobes as well as right cerebellum with normal to slightly reduced ADC  compatible with acute to early subacute infarcts. There is mild associated T2 hyperintensity/edema associated with the infarcts. A chronic microhemorrhage is noted in the region of the right external capsule. No mass, midline shift, or extra-axial fluid collection is identified. The ventricles and sulci are within normal limits for age. The pituitary gland is borderline prominent in height without a discrete sellar mass identified on this nondedicated study. Vascular: Major intracranial vascular flow voids are preserved. Skull and upper cervical spine: Unremarkable bone marrow signal. Sinuses/Orbits: Unremarkable orbits. Large bilateral mastoid effusions. Mild left frontal sinus mucosal thickening. Other: None. IMPRESSION: 1. Small acute to early subacute infarcts involving bilateral cerebral cortex and right cerebellum. 2. Large bilateral mastoid effusions. Electronically Signed   By: Logan Bores M.D.   On: 08/26/2019 19:33   Ir Fluoro Guide Cv Line Left  Result Date: 09/02/2019 CLINICAL DATA:  Renal failure, needs durable venous access for hemodialysis. Currently using temporary dialysis catheter in the left IJ. History of right IJ occlusion. EXAM: TUNNELED HEMODIALYSIS CATHETER PLACEMENT WITH ULTRASOUND AND FLUOROSCOPIC GUIDANCE TECHNIQUE: The procedure, risks, benefits, and alternatives were explained to the family. Questions regarding the procedure were encouraged and answered. The family understands and consents to the procedure. Patient was receiving adequate prophylactic antibiotic coverage already. Patency of the left IJ vein was confirmed with ultrasound with image documentation. An appropriate skin site was determined. Region was prepped using maximum barrier technique including cap and mask, sterile gown, sterile gloves, large sterile sheet, and Chlorhexidine as cutaneous antisepsis. The region was infiltrated locally with 1% lidocaine. Intravenous Fentanyl 61mg and Versed 1.513mwere administered  as conscious sedation during continuous monitoring of the patient's level of consciousness and physiological / cardiorespiratory status by the radiology RN, with a total moderate sedation time of 22 minutes. Under real-time ultrasound guidance, the left IJ vein was accessed with a 21 gauge micropuncture needle; the needle tip within the vein was confirmed with ultrasound image documentation. The 018 wire would not advance centrally. Needle exchanged over the 018 guidewire for transitional dilator, which allowed advancement of a stiff angled glidewire, negotiated to the SVC. The transitional dilator was exchanged for a 5 French angiographic catheter, advanced into the IVC. A Palindrome 23 hemodialysis catheter was tunneled from the left anterior chest wall approach to the left IJ dermatotomy site. The MPA catheter was exchanged over an Amplatz wire for serial vascular dilators which allow placement of a peel-away sheath, through which the catheter was advanced under intermittent fluoroscopy, positioned with its tips in the proximal right atrium. Spot chest radiograph confirms good catheter position. No pneumothorax. Catheter was flushed and primed per protocol. Catheter secured externally with O Prolene sutures. The left IJ dermatotomy site was closed with Dermabond. COMPLICATIONS: COMPLICATIONS None immediate FLUOROSCOPY TIME:  3 minutes 24 seconds; 12 mGy COMPARISON:  None IMPRESSION: 1. Technically successful placement of tunneled left IJ hemodialysis catheter with ultrasound and fluoroscopic guidance. Ready for routine use. ACCESS: Remains approachable for percutaneous intervention as needed. Electronically Signed   By: D Lucrezia Europe.D.   On: 09/02/2019 11:48  Ir US Guide Vasc Access Left  Result Date: 09/02/2019 CLINICAL DATA:  Renal failure, needs durable venous access for hemodialysis. Currently using temporary dialysis catheter in the left IJ. History of right IJ occlusion. EXAM: TUNNELED HEMODIALYSIS  CATHETER PLACEMENT WITH ULTRASOUND AND FLUOROSCOPIC GUIDANCE TECHNIQUE: The procedure, risks, benefits, and alternatives were explained to the family. Questions regarding the procedure were encouraged and answered. The family understands and consents to the procedure. Patient was receiving adequate prophylactic antibiotic coverage already. Patency of the left IJ vein was confirmed with ultrasound with image documentation. An appropriate skin site was determined. Region was prepped using maximum barrier technique including cap and mask, sterile gown, sterile gloves, large sterile sheet, and Chlorhexidine as cutaneous antisepsis. The region was infiltrated locally with 1% lidocaine. Intravenous Fentanyl 98mg and Versed 1.576mwere administered as conscious sedation during continuous monitoring of the patient's level of consciousness and physiological / cardiorespiratory status by the radiology RN, with a total moderate sedation time of 22 minutes. Under real-time ultrasound guidance, the left IJ vein was accessed with a 21 gauge micropuncture needle; the needle tip within the vein was confirmed with ultrasound image documentation. The 018 wire would not advance centrally. Needle exchanged over the 018 guidewire for transitional dilator, which allowed advancement of a stiff angled glidewire, negotiated to the SVC. The transitional dilator was exchanged for a 5 French angiographic catheter, advanced into the IVC. A Palindrome 23 hemodialysis catheter was tunneled from the left anterior chest wall approach to the left IJ dermatotomy site. The MPA catheter was exchanged over an Amplatz wire for serial vascular dilators which allow placement of a peel-away sheath, through which the catheter was advanced under intermittent fluoroscopy, positioned with its tips in the proximal right atrium. Spot chest radiograph confirms good catheter position. No pneumothorax. Catheter was flushed and primed per protocol. Catheter secured  externally with O Prolene sutures. The left IJ dermatotomy site was closed with Dermabond. COMPLICATIONS: COMPLICATIONS None immediate FLUOROSCOPY TIME:  3 minutes 24 seconds; 12 mGy COMPARISON:  None IMPRESSION: 1. Technically successful placement of tunneled left IJ hemodialysis catheter with ultrasound and fluoroscopic guidance. Ready for routine use. ACCESS: Remains approachable for percutaneous intervention as needed. Electronically Signed   By: D Lucrezia Europe.D.   On: 09/02/2019 11:48   Dg Chest Port 1 View  Result Date: 09/06/2019 CLINICAL DATA:  Evaluate pneumothorax. EXAM: PORTABLE CHEST 1 VIEW COMPARISON:  09/05/2019 FINDINGS: Tracheostomy tube tip is above the carina. There is a left IJ catheter with tip in the projection of the SVC. Feeding tube in place. Right chest tube is in place. Small right lateral basilar pneumothorax is unchanged. Large pneumatocele in the right lung base is again noted measuring approximately 10.5 cm. Unchanged. Bilateral interstitial and airspace opacities are stable. IMPRESSION: 1. Right-sided chest tube in place with small right lateral basilar pneumothorax. Unchanged 2. No change in aeration a lungs compared with previous exam. Electronically Signed   By: TaKerby Moors.D.   On: 09/06/2019 04:19   Dg Chest Port 1 View  Result Date: 09/05/2019 CLINICAL DATA:  Right pneumothorax. EXAM: PORTABLE CHEST 1 VIEW COMPARISON:  09/04/2019.  09/03/2019.  CT 09/01/2019. FINDINGS: Tracheostomy tube, feeding tube, left IJ dialysis catheter in stable position. Right chest tube in stable position. Stable tiny right base pneumothorax again noted. No interim change. Patchy bilateral pulmonary infiltrates again noted without interim change. Changes of pleural-parenchymal scarring again noted. Heart size stable. IMPRESSION: 1. Lines and tubes including right chest tube in stable  position. Tiny right base pneumothorax again noted without interim change. 2. Patchy bilateral pulmonary  infiltrates are again noted without interval change. Electronically Signed   By: Marcello Moores  Register   On: 09/05/2019 06:04   Dg Chest Port 1 View  Result Date: 09/04/2019 CLINICAL DATA:  Right pneumothorax. EXAM: PORTABLE CHEST 1 VIEW COMPARISON:  September 03, 2019. FINDINGS: Stable cardiomediastinal silhouette. Tracheostomy and feeding tubes are unchanged in position. Left internal jugular dialysis catheter is unchanged. Stable right-sided chest tube is noted. Stable probable minimal right basilar pneumothorax is noted laterally. Stable bilateral lung opacities are noted. Bony thorax is unremarkable. IMPRESSION: Stable support apparatus. Stable bilateral lung opacities are noted. Stable position of right-sided chest tube with probable minimal right basilar pneumothorax. Electronically Signed   By: Marijo Conception M.D.   On: 09/04/2019 07:25   Dg Chest Port 1 View  Result Date: 09/03/2019 CLINICAL DATA:  Tracheostomy. EXAM: PORTABLE CHEST 1 VIEW COMPARISON:  September 01, 2019. FINDINGS: Tracheostomy tube is in good position. Interval placement of feeding tube which is seen entering stomach. Interval placement of left internal jugular dialysis catheter with distal tip in expected position of cavoatrial junction. Stable bilateral lung opacities are noted. Right-sided chest tube is noted with minimal right basilar pneumothorax. Bony thorax is unremarkable. IMPRESSION: Tracheostomy tube in good position. Interval placement of feeding tube and left internal jugular dialysis catheter. Stable right-sided chest tube is noted with minimal right basilar pneumothorax. Stable bilateral lung opacities are noted. Electronically Signed   By: Marijo Conception M.D.   On: 09/03/2019 10:25   Dg Chest Port 1 View  Result Date: 09/01/2019 CLINICAL DATA:  68 year old male with chest tube placement. COVID-19 positive. EXAM: PORTABLE CHEST 1 VIEW COMPARISON:  Chest radiograph dated 08/31/2019 and chest CT dated 08/13/2019  FINDINGS: Support apparatus in stable position. No significant interval change in the appearance of the lungs and bilateral confluent densities predominantly involving the peripheral/subpleural regions as well as bibasilar lungs since the prior radiograph. Right lung base cystic or bolus area appears similar to prior radiograph. No large or detectable pneumothorax. Faint linear density over the right apex appears similar to prior radiograph, likely artifactual and less likely represents pleural reflection. Stable cardiac silhouette. No acute osseous pathology. IMPRESSION: 1. No significant interval change in the appearance of the lungs compared to the prior radiograph. 2. Support apparatus in stable position. Electronically Signed   By: Anner Crete M.D.   On: 09/01/2019 09:55   Dg Chest Port 1 View  Result Date: 08/31/2019 CLINICAL DATA:  COVID-19 positive.  Tracheostomy tube. EXAM: PORTABLE CHEST 1 VIEW COMPARISON:  08/30/2019 FINDINGS: Tracheostomy tube in adequate position. Enteric tube courses into the stomach and off the film as tip is not visualized. Right medial basilar chest tube unchanged. Left IJ central venous catheter horizontally oriented over the SVC unchanged. Lungs are hypoinflated with persistent tiny right-sided pneumothorax. Stable opacification over the right midlung and right base. Stable hazy opacification over the left base/retrocardiac region. Remainder of the exam is unchanged. IMPRESSION: 1. Stable opacification over the right midlung and bibasilar regions. 2.  Stable tiny right-sided pneumothorax. 3.  Tubes and lines as described. Electronically Signed   By: Marin Olp M.D.   On: 08/31/2019 07:15   Dg Chest Port 1 View  Result Date: 08/30/2019 CLINICAL DATA:  Pneumothorax on right EXAM: PORTABLE CHEST 1 VIEW COMPARISON:  Chest radiograph 08/26/2019, 08/23/2019 FINDINGS: Stable cardiomediastinal contours. Unchanged for apparatus. Persistent small right basilar pneumothorax  with  chest tube in place. Persistent infiltrates/atelectasis in the right mid upper lung and left base. No large pleural effusion. No acute finding in the visualized skeleton. IMPRESSION: Unchanged chest radiograph with persistent small right basilar pneumothorax and atelectasis/infiltrates in the right mid to upper lung as well as left base. Electronically Signed   By: Audie Pinto M.D.   On: 08/30/2019 09:42   Dg Chest Port 1 View  Result Date: 08/26/2019 CLINICAL DATA:  Pneumothorax. EXAM: PORTABLE CHEST 1 VIEW COMPARISON:  08/23/2019. FINDINGS: Tracheostomy tube, left IJ line, feeding tube, right chest tube in stable position. Stable right base pneumothorax. Stable atelectatic changes/infiltrate right upper lung and left base. Heart size stable. IMPRESSION: 1. Lines and tubes including right chest tube in stable position. Stable right base pneumothorax. 2. Stable right upper lung and left lower atelectatic changes/infiltrates. Chest is unchanged from prior exam. Electronically Signed   By: Marcello Moores  Register   On: 08/26/2019 10:36   Dg Chest Port 1 View  Result Date: 08/23/2019 CLINICAL DATA:  COVID-19 positive, endotracheal tube in place. EXAM: PORTABLE CHEST 1 VIEW COMPARISON:  Chest x-rays dated 08/22/2019. FINDINGS: Tubes and lines are stable in position, including a RIGHT-sided chest tube. Prominent lucency again noted at the RIGHT lung base, stable in the short-term interval, compatible with the air that was associated with a complicated pleural effusion as demonstrated on chest CT of 08/13/2019, at that time suspected to represent sequela of manipulation of a bronchial occlusion tube at bronchoscopy. Patchy airspace opacities are again seen bilaterally, stable, presumed pneumonia. No new lung findings. IMPRESSION: 1. Stable chest x-ray. Tubes and lines are stable in position, including the RIGHT-sided chest tube with tip positioned at the medial aspects of the RIGHT lung base 2. Patchy  airspace opacities bilaterally, stable, presumed bilateral pneumonia. 3. Stable appearance of the collection of air at the RIGHT lung base, as described above. 4. No new lung findings. Electronically Signed   By: Franki Cabot M.D.   On: 08/23/2019 09:04   Dg Chest Port 1 View  Result Date: 08/22/2019 CLINICAL DATA:  Status post tracheostomy. COVID-19 viral pneumonia. EXAM: PORTABLE CHEST 1 VIEW COMPARISON:  08/22/2019 FINDINGS: A new tracheostomy tube is seen in appropriate position. Feeding tube and bilateral central venous catheters remain in stable position. Tip of the right subclavian central venous catheter seen overlying the inferior right atrium. Heart size is within normal limits allowing for low lung volumes. Right chest tube remains in place. No pneumothorax visualized. Bullous disease again seen in right lung base. Increased atelectasis or airspace disease is seen in the retrocardiac left lung base. Mild bilateral peripheral pulmonary airspace disease shows no significant change. IMPRESSION: New tracheostomy tube in appropriate position. Right subclavian Center venous catheter tip overlies the lower right atrium. Increased atelectasis or airspace disease in retrocardiac left lung base. Electronically Signed   By: Marlaine Hind M.D.   On: 08/22/2019 16:49   Dg Chest Port 1 View  Result Date: 08/22/2019 CLINICAL DATA:  Intubated. EXAM: PORTABLE CHEST 1 VIEW COMPARISON:  08/21/2019 FINDINGS: Endotracheal tube in satisfactory position. Feeding tube extending into the distal stomach with its tip not included. Right subclavian catheter tip in the region of the superior cavoatrial junction or upper right atrium. No significant change in bilateral interstitial and patchy opacities and right basilar bullous changes. No definite pneumothorax. Minimal right pleural fluid. Normal sized heart. Thoracic spine degenerative changes. IMPRESSION: 1. No significant change in bilateral pneumonia and interstitial  disease and right basilar bullous  changes. 2. Minimal right pleural fluid. Electronically Signed   By: Claudie Revering M.D.   On: 08/22/2019 07:12   Dg Chest Port 1 View  Result Date: 08/21/2019 CLINICAL DATA:  COVID-19, intubation EXAM: PORTABLE CHEST 1 VIEW COMPARISON:  Portable exam 0535 hours compared to 08/20/2019 FINDINGS: Tip of endotracheal tube projects 3.4 cm above carina. Feeding tube extends into stomach. LEFT jugular dual-lumen central venous catheter tip projects over confluence of the SVC and LEFT brachiocephalic vein. Tip of RIGHT jugular line projects over RIGHT atrium; recommend withdrawal 4 cm to place tip at cavoatrial junction. RIGHT thoracostomy tube unchanged. Stable heart size mediastinal contours. Patchy BILATERAL airspace infiltrates consistent with pneumonia. No pleural effusion. Tiny loculated pneumothorax at lateral RIGHT base. IMPRESSION: Tiny residual RIGHT basilar atelectasis despite thoracostomy tube. Persistent BILATERAL pulmonary infiltrates consistent with pneumonia. Electronically Signed   By: Lavonia Dana M.D.   On: 08/21/2019 08:44   Dg Chest Port 1 View  Result Date: 08/20/2019 CLINICAL DATA:  68 year old male with a history of intubation EXAM: PORTABLE CHEST 1 VIEW COMPARISON:  08/19/2019, 08/18/2019, 08/17/2019 FINDINGS: Cardiomediastinal silhouette unchanged in size and contour. Low lung volumes persist. Unchanged endotracheal tube which terminates 2.4 cm above the carina. Unchanged gastric tube terminating in the stomach. Unchanged left IJ temporary hemodialysis catheter which terminates at the confluence of the left brachiocephalic vein and SVC. Unchanged right subclavian central venous catheter with the tip appearing to terminate superior right atrium. Patchy opacities of the bilateral lungs, unchanged from the prior. No pneumothorax. Blunting of left costophrenic angle with opacity in the retrocardiac region. IMPRESSION: Similar appearance of the lungs with low  lung volumes and bilateral patchy opacities, likely combination of atelectasis/consolidation, developing chronic changes, and possible small left pleural effusion. Unchanged endotracheal tube, left IJ temporary hemodialysis catheter, right subclavian central venous catheter, and gastric tube. Electronically Signed   By: Corrie Mckusick D.O.   On: 08/20/2019 07:50   Dg Chest Port 1 View  Result Date: 08/19/2019 CLINICAL DATA:  Pneumonia and COVID-19. EXAM: PORTABLE CHEST 1 VIEW COMPARISON:  August 18, 2019. FINDINGS: Stable cardiomediastinal silhouette. Endotracheal and nasogastric tubes are unchanged in position. Left internal jugular and right subclavian catheters are unchanged in position. Stable right-sided chest tube is noted with probable stable loculated right basilar hydropneumothorax. Stable opacities are noted throughout both lungs concerning for pneumonia. Bony thorax is unremarkable. IMPRESSION: Stable support apparatus. Stable right-sided chest tube with probable right basilar hydropneumothorax. Stable bilateral lung opacities. Electronically Signed   By: Marijo Conception M.D.   On: 08/19/2019 07:44   Dg Chest Port 1 View  Result Date: 08/18/2019 CLINICAL DATA:  Follow-up endotracheal tube EXAM: PORTABLE CHEST 1 VIEW COMPARISON:  08/17/2019 FINDINGS: Cardiac shadow is stable. Right subclavian central line is noted in satisfactory position. Endotracheal tube and gastric catheter are again seen and stable. Left jugular dialysis catheter is noted. Patchy opacities are again seen in both lungs. Right chest tube is again noted with loculated hydropneumothorax stable from the prior exam. IMPRESSION: Overall stable appearance of the chest. Electronically Signed   By: Inez Catalina M.D.   On: 08/18/2019 03:09   Dg Chest Port 1 View  Result Date: 08/17/2019 CLINICAL DATA:  Right-sided pneumothorax EXAM: PORTABLE CHEST 1 VIEW COMPARISON:  08/16/2019 FINDINGS: No significant interval change in AP  portable examination with right-sided chest tube in position and a small, persistent, loculated appearing hydropneumothorax about the right lung base. There is redemonstrated, extensive bilateral dense heterogeneous and interstitial airspace  opacity, worse on the right. Support apparatus including endotracheal tubes, esophagogastric tube, left neck and right subclavian vascular catheters are unchanged. Cardiomegaly. IMPRESSION: 1. No significant interval change in AP portable examination with right-sided chest tube in position and a small, persistent, loculated appearing hydropneumothorax about the right lung base. 2. There is redemonstrated, extensive bilateral dense heterogeneous and interstitial airspace opacity, worse on the right, consistent with multifocal infection. 3.  Unchanged support apparatus. Electronically Signed   By: Eddie Candle M.D.   On: 08/17/2019 15:54   Dg Chest Port 1 View  Result Date: 08/16/2019 CLINICAL DATA:  68 year old male with positive COVID-19. EXAM: PORTABLE CHEST 1 VIEW COMPARISON:  Earlier radiograph dated 08/16/2019 FINDINGS: Support lines and tubes as seen previously. Diffuse bilateral airspace opacities similar to prior radiograph. Apparent area of lucency at the right costophrenic angle may be artifactual and related to skin fold or represent a small residual right pneumothorax. Stable cardiac silhouette. No acute osseous pathology. IMPRESSION: 1. Artifact versus small residual right pneumothorax at the right costophrenic angle. 2. Diffuse bilateral airspace opacities similar to prior radiograph. 3. Support lines and tubes as seen previously. Electronically Signed   By: Anner Crete M.D.   On: 08/16/2019 21:01   Dg Chest Port 1 View  Result Date: 08/16/2019 CLINICAL DATA:  Hypoxia EXAM: PORTABLE CHEST 1 VIEW COMPARISON:  August 15, 2019 FINDINGS: Endotracheal tube tip is 4.2 cm above the carina. Nasogastric tube tip and side port are below the diaphragm. Right  subclavian catheter tip is in the right atrium. Chest tube noted on the right inferiorly, unchanged in position. Left internal jugular catheter tip is at the junction of the left innominate vein and superior vena cava. There is a catheter with its tip overlying the right lower lobe bronchus medially, stable. No pneumothorax is evident currently. There is persistent airspace consolidation in the right mid and lower lung zones. There is patchy opacity in the left mid and lower lung zones, stable. No new opacity evident. Heart is upper normal in size with pulmonary vascular normal. No adenopathy. No bone lesions. IMPRESSION: Stable tube and catheter positions. No pneumothorax evident currently. Airspace opacity bilaterally with areas of consolidation on the right, stable. No new opacity evident. Stable cardiac silhouette. Electronically Signed   By: Lowella Grip III M.D.   On: 08/16/2019 09:06   Dg Chest Port 1 View  Result Date: 08/15/2019 CLINICAL DATA:  Orogastric tube placement. EXAM: PORTABLE CHEST 1 VIEW COMPARISON:  Same day. FINDINGS: Stable cardiomediastinal silhouette. Distal tip of enteric tube is seen in proximal stomach. Endotracheal tube is unchanged. Left internal jugular catheter is unchanged. Right subclavian catheter is unchanged. Stable position of right-sided basilar chest tube is noted without pneumothorax. Stable bilateral lung opacities are noted. Stable catheter seen entering right mainstem bronchus compared to prior exam. Bony thorax is unremarkable. IMPRESSION: Feeding tube has been removed. There has been interval placement of what appears to be nasogastric tube with tip in expected position of proximal stomach. Stable bilateral lung opacities and other support apparatus are noted. Electronically Signed   By: Marijo Conception M.D.   On: 08/15/2019 14:55   Dg Chest Port 1 View  Result Date: 08/15/2019 CLINICAL DATA:  Chest tube, shortness of breath EXAM: PORTABLE CHEST 1 VIEW  COMPARISON:  08/14/2019 FINDINGS: Support devices including right basilar chest tube remain in place, unchanged. No definite pneumothorax currently. Patchy bilateral airspace opacities are again noted, unchanged. Heart is borderline in size. IMPRESSION: Patchy bilateral airspace  disease again noted, unchanged. No definite visible pneumothorax. Electronically Signed   By: Rolm Baptise M.D.   On: 08/15/2019 07:58   Dg Chest Port 1 View  Result Date: 08/14/2019 CLINICAL DATA:  Pneumothorax, chest tube EXAM: PORTABLE CHEST 1 VIEW COMPARISON:  08/14/2019 FINDINGS: Right chest tube and remainder support devices are stable. No pneumothorax. Patchy bilateral airspace opacities, right greater than left again noted, stable. Mild cardiomegaly. No visible effusions. IMPRESSION: No visible pneumothorax.  No change since prior study Electronically Signed   By: Rolm Baptise M.D.   On: 08/14/2019 11:46   Dg Chest Port 1 View  Result Date: 08/14/2019 CLINICAL DATA:  Acute respiratory failure with hypoxemia. EXAM: PORTABLE CHEST 1 VIEW COMPARISON:  August 13, 2019. FINDINGS: Stable cardiomediastinal silhouette. Endotracheal and feeding tubes are unchanged in position. Stable left internal jugular and right subclavian catheters are noted. Stable catheter is seen extending into right mainstem bronchus. Stable bilateral lung opacities are noted. Stable right-sided chest tube without pneumothorax. Small bilateral pleural effusions may be present. Bony thorax is unremarkable. IMPRESSION: Grossly stable support apparatus. Stable bilateral lung opacities as described above. Electronically Signed   By: Marijo Conception M.D.   On: 08/14/2019 07:43   Dg Chest Port 1 View  Result Date: 08/13/2019 CLINICAL DATA:  Endotracheal to EXAM: PORTABLE CHEST 1 VIEW COMPARISON:  [id number] hours FINDINGS: Endotracheal tube and 2 central venous line unchanged. NG tube exchanged for feeding tube. A catheter extends through the lumen of the  endotracheal tube into the RIGHT mainstem bronchus. There is hydropneumothorax at the RIGHT lung base again noted. There is diffuse patchy airspace disease which is slightly worse on the RIGHT and similar on the LEFT. IMPRESSION: 1. Stable support apparatus. 2. No change in subpulmonic RIGHT hydropneumothorax. 3. No change in patchy airspace disease in the RIGHT upper lobe. 4. Slight worsening airspace disease in the LEFT lung. Electronically Signed   By: Suzy Bouchard M.D.   On: 08/13/2019 17:39   Dg Chest Port 1 View  Result Date: 08/13/2019 CLINICAL DATA:  Right pneumothorax EXAM: PORTABLE CHEST 1 VIEW COMPARISON:  08/13/2019 FINDINGS: Support devices including right chest tube remain in place, unchanged. Lucency again noted over the right lung base which could reflect loculated right pneumothorax. Airspace disease throughout the lungs bilaterally, right greater than left with improvement in aeration in the left lung since prior study. Heart is normal size. IMPRESSION: Continued lucency over the right lung base could reflect loculated right pneumothorax. Bilateral airspace disease, right greater than left. Improved airspace disease on the left since prior study. Electronically Signed   By: Rolm Baptise M.D.   On: 08/13/2019 11:34   Dg Chest Port 1 View  Result Date: 08/13/2019 CLINICAL DATA:  Respiratory failure EXAM: PORTABLE CHEST 1 VIEW COMPARISON:  Yesterday FINDINGS: Endotracheal tube tip is at the clavicular heads. Right subclavian central line with tip at the upper cavoatrial junction. Endobronchial blocker on the right in stable position over the right hilum. The orogastric tube reaches the stomach. Right base chest tube which crosses the midline, presumably anterior and accentuated by leftward rotation. Basal pneumothorax on the right is unchanged. Unchanged bilateral airspace disease. IMPRESSION: 1. Unchanged hardware positioning including endobronchial blocker tip overlapping the right  hilum. 2. Unchanged bilateral airspace disease with right base lucency more likely loculated pneumothorax rather than cavitary pneumonia Electronically Signed   By: Monte Fantasia M.D.   On: 08/13/2019 09:04   Dg Chest Port 1 View  Result Date:  08/12/2019 CLINICAL DATA:  Respiratory failure. EXAM: PORTABLE CHEST 1 VIEW COMPARISON:  Chest x-ray 08/10/2019. FINDINGS: Endotracheal tube, dual-lumen left IJ line, right PICC line in stable position. Right lower lobe bronchus balloon occlusion catheter stable position. Right chest tube in stable position. Small right base pneumothorax cannot be excluded on today's exam. Bibasilar atelectasis/infiltrates again noted. No interim change. Tiny bilateral pleural effusions. IMPRESSION: 1. Endotracheal tube, dual-lumen left IJ line, right PICC line stable position. Right lower lobe bronchus balloon occlusion catheter in stable position. Right chest tube in stable position. 2. Small right base pneumothorax cannot be excluded on today's exam. 3. Bibasilar atelectasis/infiltrates again noted without interim change. Critical Value/emergent results were called by telephone at the time of interpretation on 08/12/2019 at 7:31 am to the patient's nurse, who verbally acknowledged these results. Electronically Signed   By: Marcello Moores  Register   On: 08/12/2019 07:33   Dg Chest Port 1 View  Result Date: 08/10/2019 CLINICAL DATA:  COVID positive EXAM: PORTABLE CHEST 1 VIEW COMPARISON:  08/09/2019 FINDINGS: Left lung apex and lateral costophrenic angle are partially visualized. Endotracheal tube terminates 5 cm above the carina. Right chest tube.  No pneumothorax is seen. Left IJ dual lumen catheter terminates in the mid SVC. Right subclavian venous catheter terminates at the cavoatrial junction. Enteric tube terminates in the proximal stomach. Stable selective balloon occlusion catheter in the right lower lobe bronchus. Multifocal patchy opacities, lower lung predominant, grossly  unchanged. No definite pleural effusions. The heart is normal in size. IMPRESSION: Multifocal patchy opacities, grossly unchanged, compatible with pneumonia in this patient with known COVID. Stable right chest tube.  No pneumothorax is seen. Endotracheal tube terminates 5 cm above the carina. Additional stable support apparatus as above. Electronically Signed   By: Julian Hy M.D.   On: 08/10/2019 16:59   Dg Chest Port 1 View  Result Date: 08/09/2019 CLINICAL DATA:  Right-sided pneumothorax. Acute respiratory failure. COVID-19. EXAM: PORTABLE CHEST 1 VIEW COMPARISON:  08/08/2019; 08/07/2019; chest CT-07/25/2019 FINDINGS: Grossly unchanged cardiac silhouette and mediastinal contours. Stable positioning of support apparatus with endotracheal tube overlying tracheal air column with tip superior to the carina, left jugular approach central venous catheter tips projected over the superior SVC, right subclavian approach central venous catheter tip projects over the superior caval junction, enteric tube tip and side port projected over the gastric antrum and right-sided chest tube and selective balloon occlusion catheter involving the right mainstem bronchus. No pneumothorax. Redemonstrated extensive bilateral slightly nodular airspace opacities. Left basilar/retrocardiac consolidative opacities are unchanged. No new focal airspace opacities. No definite pleural effusion. No acute osseous abnormalities. IMPRESSION: 1.  Stable positioning of support apparatus.  No pneumothorax. 2. No significant change in extensive bilateral airspace opacities compatible with provided history of COVID-19 infection. Electronically Signed   By: Sandi Mariscal M.D.   On: 08/09/2019 05:18   Dg Chest Port 1 View  Result Date: 08/08/2019 CLINICAL DATA:  Pulmonary infiltrates. Acute respiratory failure with hypoxemia. COVID-19. EXAM: PORTABLE CHEST 1 VIEW COMPARISON:  08/07/2019 and 08/06/2019 FINDINGS: Endotracheal tube in good  position 4.7 cm above the carina. Uniblocker tube tip is in the right lower lobe. Left jugular vein catheter tip is in the SVC at the level of the carina. Right subclavian catheter tip is in the right atrium. Right-sided chest tube at the base, unchanged. NG tube tip below the diaphragm. There is improved aeration at the right lung base and right midzone. No change in the infiltrates at the left base and left  midzone. Heart size and vascularity are normal. IMPRESSION: 1. Improved aeration at the right lung base. No change in the infiltrates at the left base and left midzone. No residual pneumothorax. 2. Tubes and lines appear essentially unchanged. Electronically Signed   By: Lorriane Shire M.D.   On: 08/08/2019 16:27   Dg Chest Port 1 View  Result Date: 08/07/2019 CLINICAL DATA:  Evaluate endotracheal tube position. COVID-19 positive. EXAM: PORTABLE CHEST 1 VIEW COMPARISON:  08/06/2019 FINDINGS: Endotracheal tube terminates 4.9 cm above carina. Nasogastric tube extends beyond the inferior aspect of the film. A right-sided chest tube remains in place. The right-sided pleural drain has been removed. Left internal jugular line tip at mid SVC. Right subclavian line tip at high right atrium, similar. Mild cardiomegaly. Increased fluid within the right minor fissure. The previously described right apical pneumothorax is either improved or resolved. Possible trace pleural air remaining. Increased right base airspace disease. Relatively similar patchy right upper and left mid to lower lung pulmonary opacities. IMPRESSION: Appropriate position of endotracheal tube. Single right chest tube remaining in place, with improved to resolved right apical pneumothorax. Increased right pleural fluid with worsened right base airspace disease. Otherwise similar multifocal pneumonia. Electronically Signed   By: Abigail Miyamoto M.D.   On: 08/07/2019 20:18   Dg Abd Portable 1v  Result Date: 08/30/2019 CLINICAL DATA:  NG tube  placement EXAM: PORTABLE ABDOMEN - 1 VIEW COMPARISON:  08/16/2019 FINDINGS: There is a nasogastric tube with the tip projecting over the stomach. There is no bowel dilatation to suggest obstruction. There is no evidence of pneumoperitoneum, portal venous gas or pneumatosis. There are no pathologic calcifications along the expected course of the ureters. The osseous structures are unremarkable. IMPRESSION: Nasogastric tube with the tip projecting over the stomach. Electronically Signed   By: Kathreen Devoid   On: 08/30/2019 15:56   Dg Abd Portable 1v  Result Date: 08/16/2019 CLINICAL DATA:  OG tube placement EXAM: PORTABLE ABDOMEN - 1 VIEW COMPARISON:  08/13/2019 FINDINGS: Nasogastric tube with the tip projecting over the stomach. There is no bowel dilatation to suggest obstruction. There is no evidence of pneumoperitoneum, portal venous gas or pneumatosis. There are no pathologic calcifications along the expected course of the ureters. The osseous structures are unremarkable. IMPRESSION: Nasogastric tube with the tip projecting over the stomach. Electronically Signed   By: Kathreen Devoid   On: 08/16/2019 12:09   Dg Abd Portable 1v  Result Date: 08/13/2019 CLINICAL DATA:  ETT advancement, Feeding tube placement EXAM: PORTABLE ABDOMEN - 1 VIEW COMPARISON:  Radiograph 08/10/2019 FINDINGS: NG tube has been replaced with a feeding tube. The tip of the feeding tube is within the second portion the duodenum. Gas in the stomach. No dilated loops of large or small bowel. Bibasilar effusions and atelectasis. IMPRESSION: 1. Feeding tube with tip in the second portion the duodenum. 2. No evidence of bowel obstruction. 3. Bibasilar effusions and atelectasis. Electronically Signed   By: Suzy Bouchard M.D.   On: 08/13/2019 17:30     Time Spent in minutes  30     Desiree Hane M.D on 09/06/2019 at 2:05 PM  To page go to [email or web address] - password [email or web address]

## 2019-09-06 NOTE — Progress Notes (Signed)
[facility] Progress Note  Subjective: seen in room, alert and interactive  Vitals:   09/06/19 1207 09/06/19 1215 09/06/19 1230 09/06/19 1245  BP:  (!) 151/78 (!) 151/76 (!) 152/80  Pulse:  93 83 90  Resp:  (!) 26 (!) 26 (!) 27  Temp: 98.6 F (37 C)     TempSrc: Oral     SpO2:  92% 98% 98%  Weight:      Height:        Inpatient medications: . aspirin  325 mg Per Tube Daily  . atorvastatin  40 mg Oral q1800  . B-complex with vitamin C  1 tablet Per Tube Daily  . chlorhexidine  15 mL Mouth/Throat BID  . Chlorhexidine Gluconate Cloth  6 each Topical Daily  . Chlorhexidine Gluconate Cloth  6 each Topical Q0600  . darbepoetin (ARANESP) injection - NON-DIALYSIS  100 mcg Subcutaneous Q Wed-1800  . feeding supplement (PRO-STAT SUGAR FREE 64)  30 mL Per Tube QID  . Gerhardt's butt cream   Topical TID  . heparin injection (subcutaneous)  5,000 Units Subcutaneous Q8H  . mouth rinse  15 mL Mouth Rinse 10 times per day  . midodrine  10 mg Per Tube TID WC  . pantoprazole sodium  40 mg Per Tube Daily  . sucralfate  1 g Oral Q6H   . sodium chloride Stopped (09/01/19 0000)  .  ceFAZolin (ANCEF) IV Stopped (09/05/19 2336)  . dextrose Stopped (08/16/19 1623)  . feeding supplement (NEPRO CARB STEADY) 1,000 mL (09/06/19 1331)   sodium chloride, acetaminophen (TYLENOL) oral liquid 160 mg/5 mL, dextrose, [START ON 09/07/2019] heparin, metoprolol tartrate, pneumococcal 23 valent vaccine, polyethylene glycol   Exam:   Trach collar, L hemiparesis   L IJ temp cath   No jvd   Chest clear ant / lat, R chest tube in place    Cor reg no RG     Abd soft ntnd no ascites     Ext 2+ bilat LUE edema, no LE edema    New start ESRD here - needs 4h, hep yes    Assessment/Plan:  AKI/CKD stage 3- in setting of covid PNA and has remained oliguric/anuric, dialysis dependent since CRRT started on 10/15, dc'd on 08/24/19. Now on iHD. TTS schedule here, HD today upstairs.   HTN/volume - BP's  up, wt's and volume up a bit, will pull gentle UF 1- 2 L today and will dc midodrine   AMS - more alert, possibly was uremic   New cerebral and cerebellar strokes- per primary svc  COVID PNA- treated with remdesivir, dexamethasone, and convalescent plasma. Most recent covid tests negative and is off of airborne precautions  VDRF - off vent, on trach collar per pulm  Sepsis/shock- resolved  Right pneumothorax/emphyema/necrotic RLL- s/p chest tube with air leak. Per CT Surgery he is not a candidate for VATS  A fib/RVR- per PCCM  GIB- acute s/p EGD which showed gastric ulcer with stigmata of recent bleeding s/p injection and clipping. Able to tolerate low dose heparin with CVVHD before stopping it  MSSA bacteremia- treated  Acinetobacter and pseudomonal pna- sp abx course   Rob Kalia Vahey 09/06/2019, 2:18 PM  Iron/TIBC/Ferritin/ %Sat    Component Value Date/Time   IRON 71 08/20/2019 1643   TIBC 217 (L) 08/20/2019 1643   FERRITIN 681 (H) 08/06/2019 0610   IRONPCTSAT 33 08/20/2019 1643   Recent Labs  Lab 09/02/19 0649  09/05/19 0922  NA  --    < >  134*  K  --    < > 3.9  CL  --    < > 93*  CO2  --    < > 26  GLUCOSE  --    < > 100*  BUN  --    < > 80*  CREATININE  --    < > 4.44*  CALCIUM  --    < > 8.7*  INR 1.2  --   --    < > = values in this interval not displayed.   No results for input(s): AST, ALT, ALKPHOS, BILITOT, PROT in the last 168 hours. Recent Labs  Lab 09/06/19 0951  WBC 15.6*  HGB 8.0*  HCT 24.6*  PLT 296

## 2019-09-07 DIAGNOSIS — G931 Anoxic brain damage, not elsewhere classified: Secondary | ICD-10-CM | POA: Diagnosis not present

## 2019-09-07 DIAGNOSIS — N401 Enlarged prostate with lower urinary tract symptoms: Secondary | ICD-10-CM | POA: Diagnosis not present

## 2019-09-07 DIAGNOSIS — U071 COVID-19: Secondary | ICD-10-CM | POA: Diagnosis not present

## 2019-09-07 DIAGNOSIS — N179 Acute kidney failure, unspecified: Secondary | ICD-10-CM | POA: Diagnosis not present

## 2019-09-07 DIAGNOSIS — J9311 Primary spontaneous pneumothorax: Secondary | ICD-10-CM | POA: Diagnosis not present

## 2019-09-07 LAB — RENAL FUNCTION PANEL
Albumin: 1.5 g/dL — ABNORMAL LOW (ref 3.5–5.0)
Anion gap: 16 — ABNORMAL HIGH (ref 5–15)
BUN: 41 mg/dL — ABNORMAL HIGH (ref 8–23)
CO2: 26 mmol/L (ref 22–32)
Calcium: 8.4 mg/dL — ABNORMAL LOW (ref 8.9–10.3)
Chloride: 95 mmol/L — ABNORMAL LOW (ref 98–111)
Creatinine, Ser: 2.92 mg/dL — ABNORMAL HIGH (ref 0.61–1.24)
GFR calc Af Amer: 25 mL/min — ABNORMAL LOW (ref >60)
GFR calc non Af Amer: 21 mL/min — ABNORMAL LOW (ref >60)
Glucose, Bld: 98 mg/dL (ref 70–99)
Phosphorus: 3.5 mg/dL (ref 2.5–4.6)
Potassium: 4.1 mmol/L (ref 3.5–5.1)
Sodium: 137 mmol/L (ref 135–145)

## 2019-09-07 LAB — CBC
HCT: 26.7 % — ABNORMAL LOW (ref 39.0–52.0)
Hemoglobin: 8.5 g/dL — ABNORMAL LOW (ref 13.0–17.0)
MCH: 28.8 pg (ref 26.0–34.0)
MCHC: 31.8 g/dL (ref 30.0–36.0)
MCV: 90.5 fL (ref 80.0–100.0)
Platelets: 277 10*3/uL (ref 150–400)
RBC: 2.95 MIL/uL — ABNORMAL LOW (ref 4.22–5.81)
RDW: 15.3 % (ref 11.5–15.5)
WBC: 14.4 10*3/uL — ABNORMAL HIGH (ref 4.0–10.5)
nRBC: 0.3 % — ABNORMAL HIGH (ref 0.0–0.2)

## 2019-09-07 LAB — GLUCOSE, CAPILLARY
Glucose-Capillary: 105 mg/dL — ABNORMAL HIGH (ref 70–99)
Glucose-Capillary: 106 mg/dL — ABNORMAL HIGH (ref 70–99)
Glucose-Capillary: 108 mg/dL — ABNORMAL HIGH (ref 70–99)
Glucose-Capillary: 114 mg/dL — ABNORMAL HIGH (ref 70–99)
Glucose-Capillary: 115 mg/dL — ABNORMAL HIGH (ref 70–99)
Glucose-Capillary: 97 mg/dL (ref 70–99)

## 2019-09-07 MED ORDER — POLYVINYL ALCOHOL 1.4 % OP SOLN
1.0000 [drp] | OPHTHALMIC | Status: DC | PRN
  Administered 2019-09-07 – 2019-09-14 (×3): 1 [drp] via OPHTHALMIC
  Filled 2019-09-07: qty 15

## 2019-09-07 NOTE — Progress Notes (Addendum)
      [address]       [place] [address]             [phone]      29 Days Post-Op Procedure(s) (LRB): ESOPHAGOGASTRODUODENOSCOPY (EGD) (N/A) HEMOSTASIS CONTROL HEMOSTASIS CLIP PLACEMENT   Subjective:  Up in bed, speaking valve in place, states doing okay.  Objective: Vital signs in last 24 hours: Temp:  [98.4 F (36.9 C)-99.5 F (37.5 C)] 98.8 F (37.1 C) (11/15 1159) Pulse Rate:  [80-107] 94 (11/15 1159) Cardiac Rhythm: Normal sinus rhythm (11/15 0800) Resp:  [16-31] 20 (11/15 1159) BP: (123-177)/(67-91) 175/74 (11/15 1100) SpO2:  [92 %-99 %] 95 % (11/15 1159) FiO2 (%):  [28 %] 28 % (11/15 1159) Weight:  [84 kg-85.1 kg] 84 kg (11/14 1840)  Intake/Output from previous day: 11/14 0701 - 11/15 0700 In: 1495 [NG/GT:1395; IV Piggyback:50] Out: 3709 [Stool:250; Chest Tube:20] Intake/Output this shift: Total I/O In: 180 [NG/GT:180] Out: -   General appearance: alert, cooperative and no distress Heart: regular rate and rhythm Lungs: rhonchi bilaterally Abdomen: soft, non-tender; bowel sounds normal; no masses,  no organomegaly CT: no air leak  Lab Results: Recent Labs    09/06/19 0951 09/07/19 0321  WBC 15.6* 14.4*  HGB 8.0* 8.5*  HCT 24.6* 26.7*  PLT 296 277   BMET:  Recent Labs    09/05/19 0922 09/07/19 0321  NA 134* 137  K 3.9 4.1  CL 93* 95*  CO2 26 26  GLUCOSE 100* 98  BUN 80* 41*  CREATININE 4.44* 2.92*  CALCIUM 8.7* 8.4*    PT/INR: No results for input(s): LABPROT, INR in the last 72 hours. ABG    Component Value Date/Time   PHART 7.401 08/23/2019 0453   HCO3 25.7 08/23/2019 0453   TCO2 27 08/23/2019 0453   ACIDBASEDEF 2.0 08/14/2019 0515   O2SAT 93.0 08/23/2019 0453   CBG (last 3)  Recent Labs    09/07/19 0340 09/07/19 0745 09/07/19 1156  GLUCAP 97 114* 105*    Assessment/Plan: S/P Procedure(s) (LRB): ESOPHAGOGASTRODUODENOSCOPY (EGD) (N/A) HEMOSTASIS CONTROL HEMOSTASIS CLIP PLACEMENT  1. No air  leak present, CT output 20 cc 2. Dispo- leave chest tube in place, output is low, no air leak present   LOS: 44 days    Ellwood Handler, PA-C  09/07/2019   Chart reviewed, patient examined, agree with above. CXR yesterday looked ok with tiny residual lateral ptx at base and stable large pneumatocoele. There is no air leak. Keep chest tube in today. Will get CXR tomorrow and consider removal.

## 2019-09-07 NOTE — Progress Notes (Signed)
[facility] Progress Note  Subjective: BP's up some 150/80's. Trach collar , 5L. 1.5 L off on HD yest.   Vitals:   09/07/19 0600 09/07/19 0700 09/07/19 0740 09/07/19 0800  BP: (!) 153/71 (!) 153/88 (!) 153/88 (!) 158/85  Pulse: 100 100  (!) 102  Resp: (!) 29 (!) 25 (!) 26 (!) 27  Temp:   98.9 F (37.2 C)   TempSrc:   Oral   SpO2: 98% 98%  96%  Weight:      Height: 5\' 10"  (1.778 m)       Inpatient medications: . aspirin  325 mg Per Tube Daily  . atorvastatin  40 mg Oral q1800  . B-complex with vitamin C  1 tablet Per Tube Daily  . chlorhexidine  15 mL Mouth/Throat BID  . Chlorhexidine Gluconate Cloth  6 each Topical Daily  . Chlorhexidine Gluconate Cloth  6 each Topical Q0600  . darbepoetin (ARANESP) injection - NON-DIALYSIS  100 mcg Subcutaneous Q Wed-1800  . feeding supplement (PRO-STAT SUGAR FREE 64)  30 mL Per Tube QID  . Gerhardt's butt cream   Topical TID  . heparin injection (subcutaneous)  5,000 Units Subcutaneous Q8H  . mouth rinse  15 mL Mouth Rinse 10 times per day  . pantoprazole sodium  40 mg Per Tube Daily  . sucralfate  1 g Oral Q6H   . sodium chloride Stopped (09/01/19 0000)  .  ceFAZolin (ANCEF) IV 1 g (09/06/19 2231)  . dextrose Stopped (08/16/19 1623)  . feeding supplement (NEPRO CARB STEADY) 1,000 mL (09/06/19 1331)   sodium chloride, acetaminophen (TYLENOL) oral liquid 160 mg/5 mL, dextrose, metoprolol tartrate, pneumococcal 23 valent vaccine, polyethylene glycol   Exam:   Trach collar, L hemiparesis   L IJ temp cath   No jvd   Chest clear ant / lat, R chest tube in place    Cor reg no RG     Abd soft ntnd no ascites     Ext 2+ bilat LUE edema, no LE edema    New start ESRD here - needs 4h, hep yes    Assessment/Plan:  AKI/CKD stage 3- in setting of covid PNA and has remained oliguric/anuric, dialysis dependent since CRRT started on 10/15, dc'd on 08/24/19. Now on iHD, suspected ESRD.  TTS schedule here. Had HD Sat w/ 1.5 L off.    HTN/volume - BP's up, wt's up will start to pull more volume as tolerated now that CVA is behind [place], try to keep SBP > 100- 110 on HD w/ recent CVA.   AMS - interacting now, MS much better  New cerebral and cerebellar strokes- per primary svc, significant deficits on L side and gen'd weakness from prolonged ICU as well  COVID PNA- treated with remdesivir, dexamethasone, and convalescent plasma. Most recent covid test negative and is off of airborne precautions  Resp failure/ sp trach - on trach collar 5L per pulm  Right pneumothorax/emphyema/necrotic RLL- s/p chest tube with air leak. Per CT Surgery he is not a candidate for VATS  A fib/RVR- per PCCM  GIB- acute s/p EGD which showed gastric ulcer with stigmata of recent bleeding s/p injection and clipping. Able to tolerate low dose heparin with CVVHD before stopping it  MSSA bacteremia- treated  Acinetobacter and pseudomonal pna- sp abx course  Sepsis/shock- resolved   Rob Ryla Cauthon 09/07/2019, 8:20 AM  Iron/TIBC/Ferritin/ %Sat    Component Value Date/Time   IRON 71 08/20/2019 1643   TIBC 217 (L) 08/20/2019  1643   FERRITIN 681 (H) 08/06/2019 0610   IRONPCTSAT 33 08/20/2019 1643   Recent Labs  Lab 09/02/19 0649  09/07/19 0321  NA  --    < > 137  K  --    < > 4.1  CL  --    < > 95*  CO2  --    < > 26  GLUCOSE  --    < > 98  BUN  --    < > 41*  CREATININE  --    < > 2.92*  CALCIUM  --    < > 8.4*  PHOS  --   --  3.5  ALBUMIN  --   --  1.5*  INR 1.2  --   --    < > = values in this interval not displayed.   No results for input(s): AST, ALT, ALKPHOS, BILITOT, PROT in the last 168 hours. Recent Labs  Lab 09/07/19 0321  WBC 14.4*  HGB 8.5*  HCT 26.7*  PLT 277

## 2019-09-07 NOTE — Progress Notes (Signed)
[facility]  PROGRESS NOTE  [name] ZOX:[id number] DOB: 11-06-1950 DOA: 07/25/2019 PCP: Kathyrn Drown, MD Admit date - 07/25/2019   Admitting Physician Kristopher Oppenheim, DO  Outpatient Primary MD for the patient is Kathyrn Drown, MD  LOS - 79 Brief Narrative   [name] is a 68 y.o. year old male with medical history significant for GERD and BPH who initially presented on 10/2//20 with worsening SOB x 5 days after recently testing positive for COVID-19 (07/22/19, in Epic). EMS found him to be tachypneic with profound hypoxia to 40s  requiring nonrebreather, upon arrival to [facility] ED he required emergent intubation. CXR and CTA chest showed multifocal consolidation, and ground glass opacity, negative for PE. Marland Kitchen Patient was admitted to [place] for Acute hypoxic respiratory failure secondary to severe ARDS from COVID pneumonia and started on decadron, remdesevir and toclilizumab.  Prolonged Hospital course complicated by [id number] pneumonia, ARDS, AKI requiring CRRT, encephalopathy, pneumothorax complicated by persistent air leak and bronchopleural fistula requiring endobronchial blocker and Pleur-evac, prolonged mechanical ventilation status post tracheostomy on 10/30, MSSA bacteremia with negative echocardiogram, intermittent pressor support required with dialysis, melena with acute blood loss anemia requiring transfusion, IV protonix, empiric octreotide(previous history of significant ethanol use) and EGD with gastric ulcer and nonbleeding visible vessel clipped, transferred to  on 11/3 for intermittent HD, subacute right cerebral/cerebellar infarcts on MRI treated with aspirin on 11/4.  Subjective  [name] today is very interactive, vocalizing during exam.  Denies any current pain, does have cough, denies shortness of breath.  Wife at bedside  A & P    1. Acute hypoxic respiratory failure with severe ARDS secondary to Covid pneumonia, stable.  Completed  remdesivir/Decadron/actemra/convalescent plasma therapy.  ARDS resolved. Covid test negative x2 (11/1, 11/2).  Stable on trach collar per PCCM chronically requiring 5 L maintaining normal oxygen saturation currently.  Plan to transfer out of ICU to stepdown/progressive unit given need for close respiratory monitoring  2. AKI on CKD Stage 3( baseline cr around 1.5),likely secondary to ATN related to septic shock. requiring HD TTS in hospital.  5 status much better today after HD session, will be able to promote more volume given blood pressures have quite stabilizde in setting of subacute CVA.  3. Septic shock, resolved, persistent hypotension.  Requiring as needed pressors during dialysis, continue midodrine 10 mg 3 times daily  4. Right pneumothorax/empyema/necrotic right lower lobe/bronchopleural fistula with large air leak, stable.  Chest tube in place, CT surgery following, placed to waterseal, closely monitor, serial cxr before removing chest tube likely tomorrow  5. Acute on chronic anemia secondary to gastric ulcer status post EGD with culprit vessel clipping.  Hemoglobin stable/transfusion of 1 unit on 11/13.  Continue PPI twice daily.  Daily CBC   6. H. Pylori positive by EGD biopsy. GI recommends waiting unitl clinically stable after discharge and starting 2 week regimen to prevent ulcer recurrence. GI (per Dr. Cristina Gong 10/27) to follow up in 2-3 months from hospitalization to arrange this and possible f/u EGD.   7. Small bilateral cerebral/right cerebellum infarcts(11/3) likely due to hypotension secondary to dialysis and sepsis and deconditioning. PAF is also a potential etiology.  Very interactive on exam today, still has left hemiparesis related to CVA.    Neurology recommends aspirin 325 mg, permissive hypertension (goal BP 120-140), no anticoagulation given history of GI bleed. F/u 4 wks after discharge at stroke clinic NP at [facility].  8. Anoxic brain injury. Per nephrology bilateral fronal  gyrus ribboningon MRI (11/3)consistent with cortical ischemia consistent with anoxic brain injury. Has had frequent episodes of hypotension. Goal Bp 120-140  9. Atrial fibrillation, currently rate controlled. unable to fully anticoagulate due to recent GI bleeding, continue aspirin, Briefly required amiodarone earlier in hospitalization. Monitor on telemetry.  10. Sinus tachycardia, resolved. Likely related to previously low hgb.   11. MSSA bacteremia, continue cefazolin, stop date 11/23 (6 total weeks), TTE negative but still high concern for infective endocarditis   12. Pseudomonas and Acinetobacter in BAL specimen, resolved.  Completed 10-day treatment of meropenem  13. Elevated D-dimer. > 20 on 10/19, down to 8/83 on 10/23 no full anticoagulation with prior GI bleed. Dopplers negative for DVT on 10/8. Likely related to right lung empyema.   14. Pressure injuries, not present on admission.  Pressure Injury 08/24/19 Sacrum Right;Left;Mid Stage II -  Partial thickness loss of dermis presenting as a shallow open ulcer with a red, pink wound bed without slough.  (Active)  08/24/19 0800  Location: Sacrum  Location Orientation: Right;Left;Mid  Staging: Stage II -  Partial thickness loss of dermis presenting as a shallow open ulcer with a red, pink wound bed without slough.  Wound Description (Comments): -- (stage 11 mid sacrum)  Present on Admission: No    15. Goals of care,guarded prognosis.  Patient family currently elected to continue full scope of medical treatment, appreciate palliative care team assistance.     Family Communication  : Spoke with His wife [name] at bedside on 11/14  Code Status : DNR  Disposition Plan  : Guarded prognosis, continue medical management, doing much better today, mental status greatly improved, respiratory status stable will transfer out of ICU to progressive unit to continue close monitoring respiratory status,   monitoring chest tube,   Consults   : ID, nephrology, GI, neurology PCCM, Palliative  Procedures  :  Tunneled HD cath, 11/10 HD, 11/3 Tracheostomy 10/30 EGD 10/17. Gastric ulcer, nonbleeding visible vessel clipped CRRT 10/15 Bronchoscopy with endobronchial ballon, 10/15, repeat 10/16 Right-sided chest tube, 10/14 Right pigtail chest tube--for right PTX 10/13-2/14 Right IJ 10/2-10/10 ETT 10/2        COVID-19 specific Treatment:  Decadron 10/2 > 10/12  Remdesivir 10/2 > 10/6  Actemra 10/2  Convalescent plasma 10/3   DVT Prophylaxis  : SCDs  Lab Results  Component Value Date   PLT 277 09/07/2019    Diet :  Diet Order            Diet NPO time specified  Diet effective midnight               Inpatient Medications Scheduled Meds:  aspirin  325 mg Per Tube Daily   atorvastatin  40 mg Oral q1800   B-complex with vitamin C  1 tablet Per Tube Daily   chlorhexidine  15 mL Mouth/Throat BID   Chlorhexidine Gluconate Cloth  6 each Topical Daily   Chlorhexidine Gluconate Cloth  6 each Topical Q0600   darbepoetin (ARANESP) injection - NON-DIALYSIS  100 mcg Subcutaneous Q Wed-1800   feeding supplement (PRO-STAT SUGAR FREE 64)  30 mL Per Tube QID   Gerhardt's butt cream   Topical TID   heparin injection (subcutaneous)  5,000 Units Subcutaneous Q8H   mouth rinse  15 mL Mouth Rinse 10 times per day   pantoprazole sodium  40 mg Per Tube Daily   sucralfate  1 g Oral Q6H   Continuous Infusions:  sodium chloride Stopped (09/01/19 0000)  ceFAZolin (ANCEF) IV 1 g (09/06/19 2231)   dextrose Stopped (08/16/19 1623)   feeding supplement (NEPRO CARB STEADY) 1,000 mL (09/07/19 1436)   PRN Meds:.sodium chloride, acetaminophen (TYLENOL) oral liquid 160 mg/5 mL, dextrose, metoprolol tartrate, pneumococcal 23 valent vaccine, polyethylene glycol, polyvinyl alcohol  Antibiotics  :   Anti-infectives (From admission, onward)   Start     Dose/Rate Route Frequency Ordered Stop   08/26/19 2200  ceFAZolin  (ANCEF) IVPB 1 g/50 mL premix     1 g 100 mL/hr over 30 Minutes Intravenous Every 24 hours 08/24/19 1021 09/15/19 2359   08/24/19 1100  meropenem (MERREM) 1 g in sodium chloride 0.9 % 100 mL IVPB     1 g 200 mL/hr over 30 Minutes Intravenous Every 24 hours 08/24/19 1021 08/25/19 1156   08/23/19 1100  ceFAZolin (ANCEF) IVPB 2g/100 mL premix  Status:  Discontinued     2 g 200 mL/hr over 30 Minutes Intravenous Every 12 hours 08/23/19 1048 08/24/19 1021   08/21/19 1700  vancomycin (VANCOCIN) IVPB 750 mg/150 ml premix  Status:  Discontinued     750 mg 150 mL/hr over 60 Minutes Intravenous Every 24 hours 08/21/19 0843 08/23/19 1048   08/21/19 1400  vancomycin (VANCOCIN) IVPB 750 mg/150 ml premix  Status:  Discontinued     750 mg 150 mL/hr over 60 Minutes Intravenous Every 24 hours 08/20/19 1146 08/20/19 1218   08/21/19 1000  meropenem (MERREM) 1 g in sodium chloride 0.9 % 100 mL IVPB  Status:  Discontinued     1 g 200 mL/hr over 30 Minutes Intravenous Every 12 hours 08/20/19 1535 08/23/19 1048   08/21/19 0413  meropenem (MERREM) 500 mg in sodium chloride 0.9 % 100 mL IVPB  Status:  Discontinued     500 mg 200 mL/hr over 30 Minutes Intravenous Every 24 hours 08/20/19 1245 08/20/19 1535   08/20/19 1330  vancomycin (VANCOCIN) 1,750 mg in sodium chloride 0.9 % 500 mL IVPB     1,750 mg 250 mL/hr over 120 Minutes Intravenous  Once 08/20/19 1222 08/20/19 1619   08/20/19 1300  vancomycin (VANCOCIN) 1,250 mg in sodium chloride 0.9 % 250 mL IVPB  Status:  Discontinued     1,250 mg 166.7 mL/hr over 90 Minutes Intravenous  Once 08/20/19 1146 08/20/19 1218   08/20/19 1224  vancomycin variable dose per unstable renal function (pharmacist dosing)  Status:  Discontinued      Does not apply See admin instructions 08/20/19 1225 08/21/19 1152   08/15/19 1600  meropenem (MERREM) 1 g in sodium chloride 0.9 % 100 mL IVPB  Status:  Discontinued     1 g 200 mL/hr over 30 Minutes Intravenous Every 12 hours 08/15/19  1543 08/20/19 1245   08/15/19 1000  voriconazole (VFEND) tablet 200 mg  Status:  Discontinued     200 mg Per Tube Every 12 hours 08/14/19 1148 08/15/19 1134   08/14/19 1600  anidulafungin (ERAXIS) 100 mg in sodium chloride 0.9 % 100 mL IVPB  Status:  Discontinued     100 mg 78 mL/hr over 100 Minutes Intravenous Every 24 hours 08/13/19 1645 08/14/19 1148   08/14/19 1300  voriconazole (VFEND) tablet 400 mg     400 mg Per Tube Every 12 hours 08/14/19 1148 08/14/19 2217   08/13/19 1700  anidulafungin (ERAXIS) 200 mg in sodium chloride 0.9 % 200 mL IVPB     200 mg 78 mL/hr over 200 Minutes Intravenous  Once 08/13/19 1645 08/13/19 2030  08/13/19 0200  ceFAZolin (ANCEF) IVPB 2g/100 mL premix  Status:  Discontinued     2 g 200 mL/hr over 30 Minutes Intravenous Every 12 hours 08/11/19 1512 08/15/19 1543   08/12/19 0200  ceFEPIme (MAXIPIME) 2 g in sodium chloride 0.9 % 100 mL IVPB     2 g 200 mL/hr over 30 Minutes Intravenous Every 12 hours 08/11/19 1509 08/12/19 1341   08/07/19 2200  ceFEPIme (MAXIPIME) 2 g in sodium chloride 0.9 % 100 mL IVPB  Status:  Discontinued     2 g 200 mL/hr over 30 Minutes Intravenous Every 12 hours 08/07/19 1540 08/11/19 1509   08/07/19 1000  anidulafungin (ERAXIS) 100 mg in sodium chloride 0.9 % 100 mL IVPB  Status:  Discontinued     100 mg 78 mL/hr over 100 Minutes Intravenous Every 24 hours 08/06/19 0934 08/08/19 1203   08/07/19 0800  ceFEPIme (MAXIPIME) 2 g in sodium chloride 0.9 % 100 mL IVPB  Status:  Discontinued     2 g 200 mL/hr over 30 Minutes Intravenous Every 24 hours 08/06/19 1036 08/07/19 1540   08/06/19 1000  anidulafungin (ERAXIS) 200 mg in sodium chloride 0.9 % 200 mL IVPB    Note to Pharmacy: please   200 mg 78 mL/hr over 200 Minutes Intravenous Every 24 hours 08/06/19 0925 08/06/19 1600   08/06/19 0800  ceFEPIme (MAXIPIME) 2 g in sodium chloride 0.9 % 100 mL IVPB  Status:  Discontinued     2 g 200 mL/hr over 30 Minutes Intravenous Every 12 hours  08/06/19 0733 08/06/19 1036   08/02/19 2200  ceFAZolin (ANCEF) IVPB 2g/100 mL premix  Status:  Discontinued     2 g 200 mL/hr over 30 Minutes Intravenous Every 8 hours 08/02/19 1338 08/06/19 0733   08/02/19 1400  vancomycin (VANCOCIN) 1,250 mg in sodium chloride 0.9 % 250 mL IVPB  Status:  Discontinued     1,250 mg 166.7 mL/hr over 90 Minutes Intravenous Every 24 hours 08/01/19 1320 08/02/19 1332   08/01/19 1330  vancomycin (VANCOCIN) 2,000 mg in sodium chloride 0.9 % 500 mL IVPB     2,000 mg 250 mL/hr over 120 Minutes Intravenous  Once 08/01/19 1158 08/01/19 1636   08/01/19 1300  ceFEPIme (MAXIPIME) 2 g in sodium chloride 0.9 % 100 mL IVPB  Status:  Discontinued     2 g 200 mL/hr over 30 Minutes Intravenous Every 12 hours 08/01/19 1158 08/02/19 1332   07/26/19 1600  remdesivir 100 mg in sodium chloride 0.9 % 250 mL IVPB     100 mg 500 mL/hr over 30 Minutes Intravenous Every 24 hours 07/26/19 0132 07/29/19 1823   07/26/19 0215  remdesivir 200 mg in sodium chloride 0.9 % 250 mL IVPB     200 mg 500 mL/hr over 30 Minutes Intravenous Once 07/26/19 0132 07/26/19 0520       Objective   Vitals:   09/07/19 1000 09/07/19 1100 09/07/19 1159 09/07/19 1200  BP: (!) 164/84 (!) 175/74  (!) 158/83  Pulse: 96 88 94 90  Resp: 16 (!) 21 20 (!) 24  Temp:   98.8 F (37.1 C)   TempSrc:   Axillary   SpO2: 97% 98% 95% 97%  Weight:      Height:        SpO2: 97 % O2 Flow Rate (L/min): 5 L/min FiO2 (%): 28 %  Wt Readings from Last 3 Encounters:  09/06/19 84 kg  11/08/17 96.2 kg  09/04/16 94.3 kg  Intake/Output Summary (Last 24 hours) at 09/07/2019 1446 Last data filed at 09/07/2019 1200 Gross per 24 hour  Intake 1180.03 ml  Output 1770 ml  Net -589.97 ml    Physical Exam:  Awake, opens eyes to voice, vocalizing on exam today, very interactive LIJ temp cath R chest tube in place,Trach collar in place, 5 L, normal oxygen saturation with no respiratory distress Regular rate and  rhythm Rectal tube in place 1+ bilateral upper extremity edema Boots in place on lower extremities Able to wiggle right toes and squeeze right hand on command ( unchanged), L hemiparesis    I have personally reviewed the following:   Data Reviewed:  CBC Recent Labs  Lab 09/04/19 0730 09/05/19 0922 09/05/19 2016 09/06/19 0951 09/07/19 0321  WBC 12.1* 14.0* 13.3* 15.6* 14.4*  HGB 7.4* 6.8* 8.4* 8.0* 8.5*  HCT 23.3* 21.8* 26.1* 24.6* 26.7*  PLT 331 309 290 296 277  MCV 91.0 92.0 90.6 89.8 90.5  MCH 28.9 28.7 29.2 29.2 28.8  MCHC 31.8 31.2 32.2 32.5 31.8  RDW 15.6* 15.5 15.9* 15.6* 15.3    Chemistries  Recent Labs  Lab 09/02/19 0420 09/03/19 0440 09/04/19 0730 09/05/19 0922 09/07/19 0321  NA 136 135 135 134* 137  K 5.9* 4.2 4.5 3.9 4.1  CL 100 98 94* 93* 95*  CO2 19* _0 GLUCOSE 117* 118* 103* 100* 98  BUN 183* 75* 112* 80* 41*  CREATININE 7.44* 4.26* 5.76* 4.44* 2.92*  CALCIUM 8.9 8.3* 8.7* 8.7* 8.4*   ------------------------------------------------------------------------------------------------------------------ No results for input(s): CHOL, HDL, LDLCALC, TRIG, CHOLHDL, LDLDIRECT in the last 72 hours.  Lab Results  Component Value Date   HGBA1C 6.1 (H) 07/26/2019   ------------------------------------------------------------------------------------------------------------------ No results for input(s): TSH, T4TOTAL, T3FREE, THYROIDAB in the last 72 hours.  Invalid input(s): FREET3 ------------------------------------------------------------------------------------------------------------------ No results for input(s): VITAMINB12, FOLATE, FERRITIN, TIBC, IRON, RETICCTPCT in the last 72 hours.  Coagulation profile Recent Labs  Lab 09/02/19 0649  INR 1.2    No results for input(s): DDIMER in the last 72 hours.  Cardiac Enzymes No results for input(s): CKMB, TROPONINI, MYOGLOBIN in the last 168 hours.  Invalid input(s):  CK ------------------------------------------------------------------------------------------------------------------ No results found for: BNP  Micro Results No results found for this or any previous visit (from the past 240 hour(s)).  Radiology Reports Dg Abd 1 View  Result Date: 08/09/2019 CLINICAL DATA:  ARDS from COVID-19. EXAM: ABDOMEN - 1 VIEW COMPARISON:  August 06, 2019 FINDINGS: No free air, portal venous gas, or pneumatosis identified on supine imaging. An NG tube terminates in the stomach. Foreign body projects over the midline of the lower pelvis. No other abnormalities. IMPRESSION: 1. The foreign body projecting over the pelvis may be on or in the patient. Recommend clinical correlation. 2. No other acute abnormalities. Electronically Signed   By: Dorise Bullion III M.D   On: 08/09/2019 18:50   Ct Chest Wo Contrast  Result Date: 09/01/2019 CLINICAL DATA:  Coronavirus infection. Chest tube placement for right pneumothorax. EXAM: CT CHEST WITHOUT CONTRAST TECHNIQUE: Multidetector CT imaging of the chest was performed following the standard protocol without IV contrast. COMPARISON:  Chest radiography same day. CT chest 08/13/2019 FINDINGS: Cardiovascular: Heart size upper limits of normal. Minimal aortic atherosclerosis. No acute vascular finding visible. Mediastinum/Nodes: No mass or lymphadenopathy. Lungs/Pleura: Tracheostomy remains in place. Widespread patchy pulmonary infiltrates in the left lung persist, improved since late October. Better aeration in the left lower lobe. On the right, chest tube remains in  place anteriorly, in direct communication with pleural air. Total amount of pleural air estimated at 5-10%. No evidence that this is increasing. Widespread patchy pulmonary infiltrates as seen previously, with a large air cyst in the right lower lung measuring 9.5 x 7.7 x 7.1 cm. I think this is in the right middle lobe, but find it difficult to state that with certainty. Some  chance this could be in the right lower lobe, less likely. In any case, this is likely subsequent to barotrauma. Upper Abdomen: Negative Musculoskeletal: Negative IMPRESSION: 1. Right chest tube remains in place anteriorly, in direct communication with pleural air. Total amount of pleural air estimated at 5-10%. 2. Widespread patchy pulmonary infiltrates, improved since late October. Better aeration in the left lower lobe. 3. Large air cyst in the right lower lung measuring 9.5 x 7.7 x 7.1 cm. This is presumably subsequent to barotrauma. This is slightly larger than on the study of 08/13/2019. 4. Tracheostomy remains in place. Aortic Atherosclerosis (ICD10-I70.0). Electronically Signed   By: Nelson Chimes M.D.   On: 09/01/2019 15:53   Ct Chest Wo Contrast  Result Date: 08/13/2019 CLINICAL DATA:  COVID-19 positivity, follow-up patchy infiltrates EXAM: CT CHEST WITHOUT CONTRAST TECHNIQUE: Multidetector CT imaging of the chest was performed following the standard protocol without IV contrast. COMPARISON:  07/25/2019 FINDINGS: Cardiovascular: Examination is somewhat limited due to lack of IV contrast. Atherosclerotic calcifications of the thoracic aorta are seen. No cardiac enlargement is noted. No aneurysmal dilatation is seen. Right-sided central line is noted extending into the cavoatrial junction. Left jugular central line is noted as well. Mediastinum/Nodes: Endotracheal tube and gastric catheter are seen. A blocking catheter is noted within the right lower lobe bronchi stable from prior exams. Thoracic inlet appears within normal limits. No sizable mediastinal or hilar adenopathy is noted. The esophagus is within normal limits as visualized. Lungs/Pleura: The lungs demonstrate patchy infiltrates similar to that seen on prior CT although the degree of consolidation has improved in the interval from the prior exam. Persistent right lower lobe and left lower lobe consolidation is noted. Complicated pleural  effusion is noted on the right. A considerable amount of air is noted within the pleural effusion as well as within the pleural space. Chest tube is noted anteriorly. The air is likely related to the recent bronchoscopy and manipulation of the bronchial occlusion tube. Upper Abdomen: Visualized upper abdomen shows gastric catheter within the stomach as well as multiple ligation clips within the distal stomach. Musculoskeletal: No chest wall mass or suspicious bone lesions identified. IMPRESSION: Improved aeration within the lungs when compared with the prior CT examination. There remains consolidation within the lower lobes bilaterally worse on the right than the left. Complicated pleural effusion on the right is noted with chest tube in place. The air within the pleural space is likely related to the recent manipulation of the bronchial occlusion tube. Tubes and lines as described above. Aortic Atherosclerosis (ICD10-I70.0). Electronically Signed   By: Inez Catalina M.D.   On: 08/13/2019 15:32   Mr Brain Wo Contrast  Result Date: 08/26/2019 CLINICAL DATA:  Altered level of consciousness. History of COVID-19. EXAM: MRI HEAD WITHOUT CONTRAST TECHNIQUE: Multiplanar, multiecho pulse sequences of the brain and surrounding structures were obtained without intravenous contrast. COMPARISON:  Head CT 01/05/2014 FINDINGS: Brain: There is cortical restricted diffusion posteriorly in the right frontal lobe consistent with acute infarct. Additional smaller regions of diffusion abnormality are present in the posterior left frontal lobe, anterior left frontal lobe, and  right greater than left medial parietal lobes as well as right cerebellum with normal to slightly reduced ADC compatible with acute to early subacute infarcts. There is mild associated T2 hyperintensity/edema associated with the infarcts. A chronic microhemorrhage is noted in the region of the right external capsule. No mass, midline shift, or extra-axial fluid  collection is identified. The ventricles and sulci are within normal limits for age. The pituitary gland is borderline prominent in height without a discrete sellar mass identified on this nondedicated study. Vascular: Major intracranial vascular flow voids are preserved. Skull and upper cervical spine: Unremarkable bone marrow signal. Sinuses/Orbits: Unremarkable orbits. Large bilateral mastoid effusions. Mild left frontal sinus mucosal thickening. Other: None. IMPRESSION: 1. Small acute to early subacute infarcts involving bilateral cerebral cortex and right cerebellum. 2. Large bilateral mastoid effusions. Electronically Signed   By: Logan Bores M.D.   On: 08/26/2019 19:33   Ir Fluoro Guide Cv Line Left  Result Date: 09/02/2019 CLINICAL DATA:  Renal failure, needs durable venous access for hemodialysis. Currently using temporary dialysis catheter in the left IJ. History of right IJ occlusion. EXAM: TUNNELED HEMODIALYSIS CATHETER PLACEMENT WITH ULTRASOUND AND FLUOROSCOPIC GUIDANCE TECHNIQUE: The procedure, risks, benefits, and alternatives were explained to the family. Questions regarding the procedure were encouraged and answered. The family understands and consents to the procedure. Patient was receiving adequate prophylactic antibiotic coverage already. Patency of the left IJ vein was confirmed with ultrasound with image documentation. An appropriate skin site was determined. Region was prepped using maximum barrier technique including cap and mask, sterile gown, sterile gloves, large sterile sheet, and Chlorhexidine as cutaneous antisepsis. The region was infiltrated locally with 1% lidocaine. Intravenous Fentanyl 31mg and Versed 1.531mwere administered as conscious sedation during continuous monitoring of the patient's level of consciousness and physiological / cardiorespiratory status by the radiology RN, with a total moderate sedation time of 22 minutes. Under real-time ultrasound guidance, the left  IJ vein was accessed with a 21 gauge micropuncture needle; the needle tip within the vein was confirmed with ultrasound image documentation. The 018 wire would not advance centrally. Needle exchanged over the 018 guidewire for transitional dilator, which allowed advancement of a stiff angled glidewire, negotiated to the SVC. The transitional dilator was exchanged for a 5 French angiographic catheter, advanced into the IVC. A Palindrome 23 hemodialysis catheter was tunneled from the left anterior chest wall approach to the left IJ dermatotomy site. The MPA catheter was exchanged over an Amplatz wire for serial vascular dilators which allow placement of a peel-away sheath, through which the catheter was advanced under intermittent fluoroscopy, positioned with its tips in the proximal right atrium. Spot chest radiograph confirms good catheter position. No pneumothorax. Catheter was flushed and primed per protocol. Catheter secured externally with O Prolene sutures. The left IJ dermatotomy site was closed with Dermabond. COMPLICATIONS: COMPLICATIONS None immediate FLUOROSCOPY TIME:  3 minutes 24 seconds; 12 mGy COMPARISON:  None IMPRESSION: 1. Technically successful placement of tunneled left IJ hemodialysis catheter with ultrasound and fluoroscopic guidance. Ready for routine use. ACCESS: Remains approachable for percutaneous intervention as needed. Electronically Signed   By: D Lucrezia Europe.D.   On: 09/02/2019 11:48   Ir UsKoreauide Vasc Access Left  Result Date: 09/02/2019 CLINICAL DATA:  Renal failure, needs durable venous access for hemodialysis. Currently using temporary dialysis catheter in the left IJ. History of right IJ occlusion. EXAM: TUNNELED HEMODIALYSIS CATHETER PLACEMENT WITH ULTRASOUND AND FLUOROSCOPIC GUIDANCE TECHNIQUE: The procedure, risks, benefits, and alternatives were explained  to the family. Questions regarding the procedure were encouraged and answered. The family understands and consents to  the procedure. Patient was receiving adequate prophylactic antibiotic coverage already. Patency of the left IJ vein was confirmed with ultrasound with image documentation. An appropriate skin site was determined. Region was prepped using maximum barrier technique including cap and mask, sterile gown, sterile gloves, large sterile sheet, and Chlorhexidine as cutaneous antisepsis. The region was infiltrated locally with 1% lidocaine. Intravenous Fentanyl 51mg and Versed 1.559mwere administered as conscious sedation during continuous monitoring of the patient's level of consciousness and physiological / cardiorespiratory status by the radiology RN, with a total moderate sedation time of 22 minutes. Under real-time ultrasound guidance, the left IJ vein was accessed with a 21 gauge micropuncture needle; the needle tip within the vein was confirmed with ultrasound image documentation. The 018 wire would not advance centrally. Needle exchanged over the 018 guidewire for transitional dilator, which allowed advancement of a stiff angled glidewire, negotiated to the SVC. The transitional dilator was exchanged for a 5 French angiographic catheter, advanced into the IVC. A Palindrome 23 hemodialysis catheter was tunneled from the left anterior chest wall approach to the left IJ dermatotomy site. The MPA catheter was exchanged over an Amplatz wire for serial vascular dilators which allow placement of a peel-away sheath, through which the catheter was advanced under intermittent fluoroscopy, positioned with its tips in the proximal right atrium. Spot chest radiograph confirms good catheter position. No pneumothorax. Catheter was flushed and primed per protocol. Catheter secured externally with O Prolene sutures. The left IJ dermatotomy site was closed with Dermabond. COMPLICATIONS: COMPLICATIONS None immediate FLUOROSCOPY TIME:  3 minutes 24 seconds; 12 mGy COMPARISON:  None IMPRESSION: 1. Technically successful placement of  tunneled left IJ hemodialysis catheter with ultrasound and fluoroscopic guidance. Ready for routine use. ACCESS: Remains approachable for percutaneous intervention as needed. Electronically Signed   By: D Lucrezia Europe.D.   On: 09/02/2019 11:48   Dg Chest Port 1 View  Result Date: 09/06/2019 CLINICAL DATA:  Evaluate pneumothorax. EXAM: PORTABLE CHEST 1 VIEW COMPARISON:  09/05/2019 FINDINGS: Tracheostomy tube tip is above the carina. There is a left IJ catheter with tip in the projection of the SVC. Feeding tube in place. Right chest tube is in place. Small right lateral basilar pneumothorax is unchanged. Large pneumatocele in the right lung base is again noted measuring approximately 10.5 cm. Unchanged. Bilateral interstitial and airspace opacities are stable. IMPRESSION: 1. Right-sided chest tube in place with small right lateral basilar pneumothorax. Unchanged 2. No change in aeration a lungs compared with previous exam. Electronically Signed   By: TaKerby Moors.D.   On: 09/06/2019 04:19   Dg Chest Port 1 View  Result Date: 09/05/2019 CLINICAL DATA:  Right pneumothorax. EXAM: PORTABLE CHEST 1 VIEW COMPARISON:  09/04/2019.  09/03/2019.  CT 09/01/2019. FINDINGS: Tracheostomy tube, feeding tube, left IJ dialysis catheter in stable position. Right chest tube in stable position. Stable tiny right base pneumothorax again noted. No interim change. Patchy bilateral pulmonary infiltrates again noted without interim change. Changes of pleural-parenchymal scarring again noted. Heart size stable. IMPRESSION: 1. Lines and tubes including right chest tube in stable position. Tiny right base pneumothorax again noted without interim change. 2. Patchy bilateral pulmonary infiltrates are again noted without interval change. Electronically Signed   By: ThMarcello MooresRegister   On: 09/05/2019 06:04   Dg Chest Port 1 View  Result Date: 09/04/2019 CLINICAL DATA:  Right pneumothorax. EXAM: PORTABLE CHEST 1  VIEW COMPARISON:   September 03, 2019. FINDINGS: Stable cardiomediastinal silhouette. Tracheostomy and feeding tubes are unchanged in position. Left internal jugular dialysis catheter is unchanged. Stable right-sided chest tube is noted. Stable probable minimal right basilar pneumothorax is noted laterally. Stable bilateral lung opacities are noted. Bony thorax is unremarkable. IMPRESSION: Stable support apparatus. Stable bilateral lung opacities are noted. Stable position of right-sided chest tube with probable minimal right basilar pneumothorax. Electronically Signed   By: Marijo Conception M.D.   On: 09/04/2019 07:25   Dg Chest Port 1 View  Result Date: 09/03/2019 CLINICAL DATA:  Tracheostomy. EXAM: PORTABLE CHEST 1 VIEW COMPARISON:  September 01, 2019. FINDINGS: Tracheostomy tube is in good position. Interval placement of feeding tube which is seen entering stomach. Interval placement of left internal jugular dialysis catheter with distal tip in expected position of cavoatrial junction. Stable bilateral lung opacities are noted. Right-sided chest tube is noted with minimal right basilar pneumothorax. Bony thorax is unremarkable. IMPRESSION: Tracheostomy tube in good position. Interval placement of feeding tube and left internal jugular dialysis catheter. Stable right-sided chest tube is noted with minimal right basilar pneumothorax. Stable bilateral lung opacities are noted. Electronically Signed   By: Marijo Conception M.D.   On: 09/03/2019 10:25   Dg Chest Port 1 View  Result Date: 09/01/2019 CLINICAL DATA:  68 year old male with chest tube placement. COVID-19 positive. EXAM: PORTABLE CHEST 1 VIEW COMPARISON:  Chest radiograph dated 08/31/2019 and chest CT dated 08/13/2019 FINDINGS: Support apparatus in stable position. No significant interval change in the appearance of the lungs and bilateral confluent densities predominantly involving the peripheral/subpleural regions as well as bibasilar lungs since the prior radiograph.  Right lung base cystic or bolus area appears similar to prior radiograph. No large or detectable pneumothorax. Faint linear density over the right apex appears similar to prior radiograph, likely artifactual and less likely represents pleural reflection. Stable cardiac silhouette. No acute osseous pathology. IMPRESSION: 1. No significant interval change in the appearance of the lungs compared to the prior radiograph. 2. Support apparatus in stable position. Electronically Signed   By: Anner Crete M.D.   On: 09/01/2019 09:55   Dg Chest Port 1 View  Result Date: 08/31/2019 CLINICAL DATA:  COVID-19 positive.  Tracheostomy tube. EXAM: PORTABLE CHEST 1 VIEW COMPARISON:  08/30/2019 FINDINGS: Tracheostomy tube in adequate position. Enteric tube courses into the stomach and off the film as tip is not visualized. Right medial basilar chest tube unchanged. Left IJ central venous catheter horizontally oriented over the SVC unchanged. Lungs are hypoinflated with persistent tiny right-sided pneumothorax. Stable opacification over the right midlung and right base. Stable hazy opacification over the left base/retrocardiac region. Remainder of the exam is unchanged. IMPRESSION: 1. Stable opacification over the right midlung and bibasilar regions. 2.  Stable tiny right-sided pneumothorax. 3.  Tubes and lines as described. Electronically Signed   By: Marin Olp M.D.   On: 08/31/2019 07:15   Dg Chest Port 1 View  Result Date: 08/30/2019 CLINICAL DATA:  Pneumothorax on right EXAM: PORTABLE CHEST 1 VIEW COMPARISON:  Chest radiograph 08/26/2019, 08/23/2019 FINDINGS: Stable cardiomediastinal contours. Unchanged for apparatus. Persistent small right basilar pneumothorax with chest tube in place. Persistent infiltrates/atelectasis in the right mid upper lung and left base. No large pleural effusion. No acute finding in the visualized skeleton. IMPRESSION: Unchanged chest radiograph with persistent small right basilar  pneumothorax and atelectasis/infiltrates in the right mid to upper lung as well as left base. Electronically Signed  By: Audie Pinto M.D.   On: 08/30/2019 09:42   Dg Chest Port 1 View  Result Date: 08/26/2019 CLINICAL DATA:  Pneumothorax. EXAM: PORTABLE CHEST 1 VIEW COMPARISON:  08/23/2019. FINDINGS: Tracheostomy tube, left IJ line, feeding tube, right chest tube in stable position. Stable right base pneumothorax. Stable atelectatic changes/infiltrate right upper lung and left base. Heart size stable. IMPRESSION: 1. Lines and tubes including right chest tube in stable position. Stable right base pneumothorax. 2. Stable right upper lung and left lower atelectatic changes/infiltrates. Chest is unchanged from prior exam. Electronically Signed   By: Marcello Moores  Register   On: 08/26/2019 10:36   Dg Chest Port 1 View  Result Date: 08/23/2019 CLINICAL DATA:  COVID-19 positive, endotracheal tube in place. EXAM: PORTABLE CHEST 1 VIEW COMPARISON:  Chest x-rays dated 08/22/2019. FINDINGS: Tubes and lines are stable in position, including a RIGHT-sided chest tube. Prominent lucency again noted at the RIGHT lung base, stable in the short-term interval, compatible with the air that was associated with a complicated pleural effusion as demonstrated on chest CT of 08/13/2019, at that time suspected to represent sequela of manipulation of a bronchial occlusion tube at bronchoscopy. Patchy airspace opacities are again seen bilaterally, stable, presumed pneumonia. No new lung findings. IMPRESSION: 1. Stable chest x-ray. Tubes and lines are stable in position, including the RIGHT-sided chest tube with tip positioned at the medial aspects of the RIGHT lung base 2. Patchy airspace opacities bilaterally, stable, presumed bilateral pneumonia. 3. Stable appearance of the collection of air at the RIGHT lung base, as described above. 4. No new lung findings. Electronically Signed   By: Franki Cabot M.D.   On: 08/23/2019 09:04    Dg Chest Port 1 View  Result Date: 08/22/2019 CLINICAL DATA:  Status post tracheostomy. COVID-19 viral pneumonia. EXAM: PORTABLE CHEST 1 VIEW COMPARISON:  08/22/2019 FINDINGS: A new tracheostomy tube is seen in appropriate position. Feeding tube and bilateral central venous catheters remain in stable position. Tip of the right subclavian central venous catheter seen overlying the inferior right atrium. Heart size is within normal limits allowing for low lung volumes. Right chest tube remains in place. No pneumothorax visualized. Bullous disease again seen in right lung base. Increased atelectasis or airspace disease is seen in the retrocardiac left lung base. Mild bilateral peripheral pulmonary airspace disease shows no significant change. IMPRESSION: New tracheostomy tube in appropriate position. Right subclavian Center venous catheter tip overlies the lower right atrium. Increased atelectasis or airspace disease in retrocardiac left lung base. Electronically Signed   By: Marlaine Hind M.D.   On: 08/22/2019 16:49   Dg Chest Port 1 View  Result Date: 08/22/2019 CLINICAL DATA:  Intubated. EXAM: PORTABLE CHEST 1 VIEW COMPARISON:  08/21/2019 FINDINGS: Endotracheal tube in satisfactory position. Feeding tube extending into the distal stomach with its tip not included. Right subclavian catheter tip in the region of the superior cavoatrial junction or upper right atrium. No significant change in bilateral interstitial and patchy opacities and right basilar bullous changes. No definite pneumothorax. Minimal right pleural fluid. Normal sized heart. Thoracic spine degenerative changes. IMPRESSION: 1. No significant change in bilateral pneumonia and interstitial disease and right basilar bullous changes. 2. Minimal right pleural fluid. Electronically Signed   By: Claudie Revering M.D.   On: 08/22/2019 07:12   Dg Chest Port 1 View  Result Date: 08/21/2019 CLINICAL DATA:  COVID-19, intubation EXAM: PORTABLE CHEST 1  VIEW COMPARISON:  Portable exam 0535 hours compared to 08/20/2019 FINDINGS: Tip of endotracheal  tube projects 3.4 cm above carina. Feeding tube extends into stomach. LEFT jugular dual-lumen central venous catheter tip projects over confluence of the SVC and LEFT brachiocephalic vein. Tip of RIGHT jugular line projects over RIGHT atrium; recommend withdrawal 4 cm to place tip at cavoatrial junction. RIGHT thoracostomy tube unchanged. Stable heart size mediastinal contours. Patchy BILATERAL airspace infiltrates consistent with pneumonia. No pleural effusion. Tiny loculated pneumothorax at lateral RIGHT base. IMPRESSION: Tiny residual RIGHT basilar atelectasis despite thoracostomy tube. Persistent BILATERAL pulmonary infiltrates consistent with pneumonia. Electronically Signed   By: Lavonia Dana M.D.   On: 08/21/2019 08:44   Dg Chest Port 1 View  Result Date: 08/20/2019 CLINICAL DATA:  68 year old male with a history of intubation EXAM: PORTABLE CHEST 1 VIEW COMPARISON:  08/19/2019, 08/18/2019, 08/17/2019 FINDINGS: Cardiomediastinal silhouette unchanged in size and contour. Low lung volumes persist. Unchanged endotracheal tube which terminates 2.4 cm above the carina. Unchanged gastric tube terminating in the stomach. Unchanged left IJ temporary hemodialysis catheter which terminates at the confluence of the left brachiocephalic vein and SVC. Unchanged right subclavian central venous catheter with the tip appearing to terminate superior right atrium. Patchy opacities of the bilateral lungs, unchanged from the prior. No pneumothorax. Blunting of left costophrenic angle with opacity in the retrocardiac region. IMPRESSION: Similar appearance of the lungs with low lung volumes and bilateral patchy opacities, likely combination of atelectasis/consolidation, developing chronic changes, and possible small left pleural effusion. Unchanged endotracheal tube, left IJ temporary hemodialysis catheter, right subclavian central  venous catheter, and gastric tube. Electronically Signed   By: Corrie Mckusick D.O.   On: 08/20/2019 07:50   Dg Chest Port 1 View  Result Date: 08/19/2019 CLINICAL DATA:  Pneumonia and COVID-19. EXAM: PORTABLE CHEST 1 VIEW COMPARISON:  August 18, 2019. FINDINGS: Stable cardiomediastinal silhouette. Endotracheal and nasogastric tubes are unchanged in position. Left internal jugular and right subclavian catheters are unchanged in position. Stable right-sided chest tube is noted with probable stable loculated right basilar hydropneumothorax. Stable opacities are noted throughout both lungs concerning for pneumonia. Bony thorax is unremarkable. IMPRESSION: Stable support apparatus. Stable right-sided chest tube with probable right basilar hydropneumothorax. Stable bilateral lung opacities. Electronically Signed   By: Marijo Conception M.D.   On: 08/19/2019 07:44   Dg Chest Port 1 View  Result Date: 08/18/2019 CLINICAL DATA:  Follow-up endotracheal tube EXAM: PORTABLE CHEST 1 VIEW COMPARISON:  08/17/2019 FINDINGS: Cardiac shadow is stable. Right subclavian central line is noted in satisfactory position. Endotracheal tube and gastric catheter are again seen and stable. Left jugular dialysis catheter is noted. Patchy opacities are again seen in both lungs. Right chest tube is again noted with loculated hydropneumothorax stable from the prior exam. IMPRESSION: Overall stable appearance of the chest. Electronically Signed   By: Inez Catalina M.D.   On: 08/18/2019 03:09   Dg Chest Port 1 View  Result Date: 08/17/2019 CLINICAL DATA:  Right-sided pneumothorax EXAM: PORTABLE CHEST 1 VIEW COMPARISON:  08/16/2019 FINDINGS: No significant interval change in AP portable examination with right-sided chest tube in position and a small, persistent, loculated appearing hydropneumothorax about the right lung base. There is redemonstrated, extensive bilateral dense heterogeneous and interstitial airspace opacity, worse on the  right. Support apparatus including endotracheal tubes, esophagogastric tube, left neck and right subclavian vascular catheters are unchanged. Cardiomegaly. IMPRESSION: 1. No significant interval change in AP portable examination with right-sided chest tube in position and a small, persistent, loculated appearing hydropneumothorax about the right lung base. 2. There is redemonstrated,  extensive bilateral dense heterogeneous and interstitial airspace opacity, worse on the right, consistent with multifocal infection. 3.  Unchanged support apparatus. Electronically Signed   By: Eddie Candle M.D.   On: 08/17/2019 15:54   Dg Chest Port 1 View  Result Date: 08/16/2019 CLINICAL DATA:  68 year old male with positive COVID-19. EXAM: PORTABLE CHEST 1 VIEW COMPARISON:  Earlier radiograph dated 08/16/2019 FINDINGS: Support lines and tubes as seen previously. Diffuse bilateral airspace opacities similar to prior radiograph. Apparent area of lucency at the right costophrenic angle may be artifactual and related to skin fold or represent a small residual right pneumothorax. Stable cardiac silhouette. No acute osseous pathology. IMPRESSION: 1. Artifact versus small residual right pneumothorax at the right costophrenic angle. 2. Diffuse bilateral airspace opacities similar to prior radiograph. 3. Support lines and tubes as seen previously. Electronically Signed   By: Anner Crete M.D.   On: 08/16/2019 21:01   Dg Chest Port 1 View  Result Date: 08/16/2019 CLINICAL DATA:  Hypoxia EXAM: PORTABLE CHEST 1 VIEW COMPARISON:  August 15, 2019 FINDINGS: Endotracheal tube tip is 4.2 cm above the carina. Nasogastric tube tip and side port are below the diaphragm. Right subclavian catheter tip is in the right atrium. Chest tube noted on the right inferiorly, unchanged in position. Left internal jugular catheter tip is at the junction of the left innominate vein and superior vena cava. There is a catheter with its tip overlying the  right lower lobe bronchus medially, stable. No pneumothorax is evident currently. There is persistent airspace consolidation in the right mid and lower lung zones. There is patchy opacity in the left mid and lower lung zones, stable. No new opacity evident. Heart is upper normal in size with pulmonary vascular normal. No adenopathy. No bone lesions. IMPRESSION: Stable tube and catheter positions. No pneumothorax evident currently. Airspace opacity bilaterally with areas of consolidation on the right, stable. No new opacity evident. Stable cardiac silhouette. Electronically Signed   By: Lowella Grip III M.D.   On: 08/16/2019 09:06   Dg Chest Port 1 View  Result Date: 08/15/2019 CLINICAL DATA:  Orogastric tube placement. EXAM: PORTABLE CHEST 1 VIEW COMPARISON:  Same day. FINDINGS: Stable cardiomediastinal silhouette. Distal tip of enteric tube is seen in proximal stomach. Endotracheal tube is unchanged. Left internal jugular catheter is unchanged. Right subclavian catheter is unchanged. Stable position of right-sided basilar chest tube is noted without pneumothorax. Stable bilateral lung opacities are noted. Stable catheter seen entering right mainstem bronchus compared to prior exam. Bony thorax is unremarkable. IMPRESSION: Feeding tube has been removed. There has been interval placement of what appears to be nasogastric tube with tip in expected position of proximal stomach. Stable bilateral lung opacities and other support apparatus are noted. Electronically Signed   By: Marijo Conception M.D.   On: 08/15/2019 14:55   Dg Chest Port 1 View  Result Date: 08/15/2019 CLINICAL DATA:  Chest tube, shortness of breath EXAM: PORTABLE CHEST 1 VIEW COMPARISON:  08/14/2019 FINDINGS: Support devices including right basilar chest tube remain in place, unchanged. No definite pneumothorax currently. Patchy bilateral airspace opacities are again noted, unchanged. Heart is borderline in size. IMPRESSION: Patchy  bilateral airspace disease again noted, unchanged. No definite visible pneumothorax. Electronically Signed   By: Rolm Baptise M.D.   On: 08/15/2019 07:58   Dg Chest Port 1 View  Result Date: 08/14/2019 CLINICAL DATA:  Pneumothorax, chest tube EXAM: PORTABLE CHEST 1 VIEW COMPARISON:  08/14/2019 FINDINGS: Right chest tube and remainder support  devices are stable. No pneumothorax. Patchy bilateral airspace opacities, right greater than left again noted, stable. Mild cardiomegaly. No visible effusions. IMPRESSION: No visible pneumothorax.  No change since prior study Electronically Signed   By: Rolm Baptise M.D.   On: 08/14/2019 11:46   Dg Chest Port 1 View  Result Date: 08/14/2019 CLINICAL DATA:  Acute respiratory failure with hypoxemia. EXAM: PORTABLE CHEST 1 VIEW COMPARISON:  August 13, 2019. FINDINGS: Stable cardiomediastinal silhouette. Endotracheal and feeding tubes are unchanged in position. Stable left internal jugular and right subclavian catheters are noted. Stable catheter is seen extending into right mainstem bronchus. Stable bilateral lung opacities are noted. Stable right-sided chest tube without pneumothorax. Small bilateral pleural effusions may be present. Bony thorax is unremarkable. IMPRESSION: Grossly stable support apparatus. Stable bilateral lung opacities as described above. Electronically Signed   By: Marijo Conception M.D.   On: 08/14/2019 07:43   Dg Chest Port 1 View  Result Date: 08/13/2019 CLINICAL DATA:  Endotracheal to EXAM: PORTABLE CHEST 1 VIEW COMPARISON:  [id number] hours FINDINGS: Endotracheal tube and 2 central venous line unchanged. NG tube exchanged for feeding tube. A catheter extends through the lumen of the endotracheal tube into the RIGHT mainstem bronchus. There is hydropneumothorax at the RIGHT lung base again noted. There is diffuse patchy airspace disease which is slightly worse on the RIGHT and similar on the LEFT. IMPRESSION: 1. Stable support apparatus. 2. No  change in subpulmonic RIGHT hydropneumothorax. 3. No change in patchy airspace disease in the RIGHT upper lobe. 4. Slight worsening airspace disease in the LEFT lung. Electronically Signed   By: Suzy Bouchard M.D.   On: 08/13/2019 17:39   Dg Chest Port 1 View  Result Date: 08/13/2019 CLINICAL DATA:  Right pneumothorax EXAM: PORTABLE CHEST 1 VIEW COMPARISON:  08/13/2019 FINDINGS: Support devices including right chest tube remain in place, unchanged. Lucency again noted over the right lung base which could reflect loculated right pneumothorax. Airspace disease throughout the lungs bilaterally, right greater than left with improvement in aeration in the left lung since prior study. Heart is normal size. IMPRESSION: Continued lucency over the right lung base could reflect loculated right pneumothorax. Bilateral airspace disease, right greater than left. Improved airspace disease on the left since prior study. Electronically Signed   By: Rolm Baptise M.D.   On: 08/13/2019 11:34   Dg Chest Port 1 View  Result Date: 08/13/2019 CLINICAL DATA:  Respiratory failure EXAM: PORTABLE CHEST 1 VIEW COMPARISON:  Yesterday FINDINGS: Endotracheal tube tip is at the clavicular heads. Right subclavian central line with tip at the upper cavoatrial junction. Endobronchial blocker on the right in stable position over the right hilum. The orogastric tube reaches the stomach. Right base chest tube which crosses the midline, presumably anterior and accentuated by leftward rotation. Basal pneumothorax on the right is unchanged. Unchanged bilateral airspace disease. IMPRESSION: 1. Unchanged hardware positioning including endobronchial blocker tip overlapping the right hilum. 2. Unchanged bilateral airspace disease with right base lucency more likely loculated pneumothorax rather than cavitary pneumonia Electronically Signed   By: Monte Fantasia M.D.   On: 08/13/2019 09:04   Dg Chest Port 1 View  Result Date:  08/12/2019 CLINICAL DATA:  Respiratory failure. EXAM: PORTABLE CHEST 1 VIEW COMPARISON:  Chest x-ray 08/10/2019. FINDINGS: Endotracheal tube, dual-lumen left IJ line, right PICC line in stable position. Right lower lobe bronchus balloon occlusion catheter stable position. Right chest tube in stable position. Small right base pneumothorax cannot be excluded on today's exam.  Bibasilar atelectasis/infiltrates again noted. No interim change. Tiny bilateral pleural effusions. IMPRESSION: 1. Endotracheal tube, dual-lumen left IJ line, right PICC line stable position. Right lower lobe bronchus balloon occlusion catheter in stable position. Right chest tube in stable position. 2. Small right base pneumothorax cannot be excluded on today's exam. 3. Bibasilar atelectasis/infiltrates again noted without interim change. Critical Value/emergent results were called by telephone at the time of interpretation on 08/12/2019 at 7:31 am to the patient's nurse, who verbally acknowledged these results. Electronically Signed   By: Marcello Moores  Register   On: 08/12/2019 07:33   Dg Chest Port 1 View  Result Date: 08/10/2019 CLINICAL DATA:  COVID positive EXAM: PORTABLE CHEST 1 VIEW COMPARISON:  08/09/2019 FINDINGS: Left lung apex and lateral costophrenic angle are partially visualized. Endotracheal tube terminates 5 cm above the carina. Right chest tube.  No pneumothorax is seen. Left IJ dual lumen catheter terminates in the mid SVC. Right subclavian venous catheter terminates at the cavoatrial junction. Enteric tube terminates in the proximal stomach. Stable selective balloon occlusion catheter in the right lower lobe bronchus. Multifocal patchy opacities, lower lung predominant, grossly unchanged. No definite pleural effusions. The heart is normal in size. IMPRESSION: Multifocal patchy opacities, grossly unchanged, compatible with pneumonia in this patient with known COVID. Stable right chest tube.  No pneumothorax is seen. Endotracheal  tube terminates 5 cm above the carina. Additional stable support apparatus as above. Electronically Signed   By: Julian Hy M.D.   On: 08/10/2019 16:59   Dg Chest Port 1 View  Result Date: 08/09/2019 CLINICAL DATA:  Right-sided pneumothorax. Acute respiratory failure. COVID-19. EXAM: PORTABLE CHEST 1 VIEW COMPARISON:  08/08/2019; 08/07/2019; chest CT-07/25/2019 FINDINGS: Grossly unchanged cardiac silhouette and mediastinal contours. Stable positioning of support apparatus with endotracheal tube overlying tracheal air column with tip superior to the carina, left jugular approach central venous catheter tips projected over the superior SVC, right subclavian approach central venous catheter tip projects over the superior caval junction, enteric tube tip and side port projected over the gastric antrum and right-sided chest tube and selective balloon occlusion catheter involving the right mainstem bronchus. No pneumothorax. Redemonstrated extensive bilateral slightly nodular airspace opacities. Left basilar/retrocardiac consolidative opacities are unchanged. No new focal airspace opacities. No definite pleural effusion. No acute osseous abnormalities. IMPRESSION: 1.  Stable positioning of support apparatus.  No pneumothorax. 2. No significant change in extensive bilateral airspace opacities compatible with provided history of COVID-19 infection. Electronically Signed   By: Sandi Mariscal M.D.   On: 08/09/2019 05:18   Dg Chest Port 1 View  Result Date: 08/08/2019 CLINICAL DATA:  Pulmonary infiltrates. Acute respiratory failure with hypoxemia. COVID-19. EXAM: PORTABLE CHEST 1 VIEW COMPARISON:  08/07/2019 and 08/06/2019 FINDINGS: Endotracheal tube in good position 4.7 cm above the carina. Uniblocker tube tip is in the right lower lobe. Left jugular vein catheter tip is in the SVC at the level of the carina. Right subclavian catheter tip is in the right atrium. Right-sided chest tube at the base, unchanged. NG  tube tip below the diaphragm. There is improved aeration at the right lung base and right midzone. No change in the infiltrates at the left base and left midzone. Heart size and vascularity are normal. IMPRESSION: 1. Improved aeration at the right lung base. No change in the infiltrates at the left base and left midzone. No residual pneumothorax. 2. Tubes and lines appear essentially unchanged. Electronically Signed   By: Lorriane Shire M.D.   On: 08/08/2019 16:27  Dg Abd Portable 1v  Result Date: 08/30/2019 CLINICAL DATA:  NG tube placement EXAM: PORTABLE ABDOMEN - 1 VIEW COMPARISON:  08/16/2019 FINDINGS: There is a nasogastric tube with the tip projecting over the stomach. There is no bowel dilatation to suggest obstruction. There is no evidence of pneumoperitoneum, portal venous gas or pneumatosis. There are no pathologic calcifications along the expected course of the ureters. The osseous structures are unremarkable. IMPRESSION: Nasogastric tube with the tip projecting over the stomach. Electronically Signed   By: Kathreen Devoid   On: 08/30/2019 15:56   Dg Abd Portable 1v  Result Date: 08/16/2019 CLINICAL DATA:  OG tube placement EXAM: PORTABLE ABDOMEN - 1 VIEW COMPARISON:  08/13/2019 FINDINGS: Nasogastric tube with the tip projecting over the stomach. There is no bowel dilatation to suggest obstruction. There is no evidence of pneumoperitoneum, portal venous gas or pneumatosis. There are no pathologic calcifications along the expected course of the ureters. The osseous structures are unremarkable. IMPRESSION: Nasogastric tube with the tip projecting over the stomach. Electronically Signed   By: Kathreen Devoid   On: 08/16/2019 12:09   Dg Abd Portable 1v  Result Date: 08/13/2019 CLINICAL DATA:  ETT advancement, Feeding tube placement EXAM: PORTABLE ABDOMEN - 1 VIEW COMPARISON:  Radiograph 08/10/2019 FINDINGS: NG tube has been replaced with a feeding tube. The tip of the feeding tube is within the  second portion the duodenum. Gas in the stomach. No dilated loops of large or small bowel. Bibasilar effusions and atelectasis. IMPRESSION: 1. Feeding tube with tip in the second portion the duodenum. 2. No evidence of bowel obstruction. 3. Bibasilar effusions and atelectasis. Electronically Signed   By: Suzy Bouchard M.D.   On: 08/13/2019 17:30     Time Spent in minutes  30     Desiree Hane M.D on 09/07/2019 at 2:46 PM  To page go to [email or web address] - password [facility]

## 2019-09-08 ENCOUNTER — Inpatient Hospital Stay (HOSPITAL_COMMUNITY): Payer: Medicare HMO

## 2019-09-08 DIAGNOSIS — U071 COVID-19: Secondary | ICD-10-CM

## 2019-09-08 DIAGNOSIS — N179 Acute kidney failure, unspecified: Secondary | ICD-10-CM | POA: Diagnosis not present

## 2019-09-08 DIAGNOSIS — N401 Enlarged prostate with lower urinary tract symptoms: Secondary | ICD-10-CM | POA: Diagnosis not present

## 2019-09-08 DIAGNOSIS — J9601 Acute respiratory failure with hypoxia: Secondary | ICD-10-CM | POA: Diagnosis not present

## 2019-09-08 DIAGNOSIS — G931 Anoxic brain damage, not elsewhere classified: Secondary | ICD-10-CM | POA: Diagnosis not present

## 2019-09-08 DIAGNOSIS — J9311 Primary spontaneous pneumothorax: Secondary | ICD-10-CM | POA: Diagnosis not present

## 2019-09-08 LAB — GLUCOSE, CAPILLARY
Glucose-Capillary: 101 mg/dL — ABNORMAL HIGH (ref 70–99)
Glucose-Capillary: 92 mg/dL (ref 70–99)
Glucose-Capillary: 95 mg/dL (ref 70–99)
Glucose-Capillary: 112 mg/dL — ABNORMAL HIGH (ref 70–99)
Glucose-Capillary: 93 mg/dL (ref 70–99)
Glucose-Capillary: 104 mg/dL — ABNORMAL HIGH (ref 70–99)

## 2019-09-08 LAB — CBC
HCT: 24 % — ABNORMAL LOW (ref 39.0–52.0)
Hemoglobin: 7.6 g/dL — ABNORMAL LOW (ref 13.0–17.0)
MCH: 28.7 pg (ref 26.0–34.0)
MCHC: 31.7 g/dL (ref 30.0–36.0)
MCV: 90.6 fL (ref 80.0–100.0)
Platelets: 264 10*3/uL (ref 150–400)
RBC: 2.65 MIL/uL — ABNORMAL LOW (ref 4.22–5.81)
RDW: 14.5 % (ref 11.5–15.5)
WBC: 13.9 10*3/uL — ABNORMAL HIGH (ref 4.0–10.5)
nRBC: 0.2 % (ref 0.0–0.2)

## 2019-09-08 MED ORDER — CHLORHEXIDINE GLUCONATE CLOTH 2 % EX PADS
6.0000 | MEDICATED_PAD | Freq: Every day | CUTANEOUS | Status: DC
  Administered 2019-09-11 – 2019-09-12 (×2): 6 via TOPICAL

## 2019-09-08 MED ORDER — ATROPINE SULFATE 1 MG/10ML IJ SOSY
PREFILLED_SYRINGE | INTRAMUSCULAR | Status: AC
  Filled 2019-09-08: qty 10

## 2019-09-08 NOTE — Progress Notes (Signed)
Palliative Medicine RN Note: Our office rec'd a call from Mrs Alper to let us know that she got the message left last week by our PMT [facility]. She has no needs and does not need [place] to call her back. She reports she will call us if she needs [place].  PMT will continue to shadow the chart. If new needs arise, please let us know.  Marjie Skiff Aylla Huffine, RN, BSN, [facility] Palliative Medicine Team 09/08/2019 9:44 AM Office [phone]

## 2019-09-08 NOTE — Progress Notes (Signed)
RT and NP attempted to exchange trach without success. Waiting for MD assistance. RT will continue to monitor.

## 2019-09-08 NOTE — Progress Notes (Signed)
NAME:  [name], MRN:  [id number], DOB:  1951/09/15, LOS: 25 ADMISSION DATE:  07/25/2019, CONSULTATION DATE:  10/3 REFERRING MD:  Nadara Mustard, CHIEF COMPLAINT:  Dyspnea   Brief History   68 y/o male diagnosed with COVID on 9/29 presented to the [facility] ED on 10/2 with dyspnea, hypoxemia requiring intubation.  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  Tracheostomy placed on 10/30.  To [facility] 11/1 for IHD.  Chest tube to suction, improvement in air leak.  Waterseal 11/13  Past Medical History  GERD  Significant Hospital Events   10/03 Admit to [facility] from [facility] ER, start decadron and remdesivir, given tociluzimab; prone positioning 10/04 convalescent plasma 10/05 stop prone positioning 10/06 convalescent plasma; prone positioning again 10/09 start ABx 10/10 MSSA bacteremia, CVL d/ced; ID consulted; increase OG tube outpt 10/11 vent weaning trial started 10/13 fever, pneumothorax, pig tail chest tube placed 10/14 worsening hypotension and renal fx >> consulted nephrology; worsening PTX >> replaced chest tube; GNR in sputum >> ABx changed 10/15 start CRRT; endobronchial blocker placed for persistent air leak 10/16 endobronchial blocker repositioned, changed chest tube to water seal; melana with ABLA >> GI consulted; transfuse PRBC 10/17 persistent air leak, increased WOB; start nimbex gtt >> air leak decreased; EGD; A fib with RVR >> start amiodarone 10/18 transfuse PRBC; GI s/o 10/20 resume heparin gtt 10/21 stopped nimbex, chest tube to suction, stopped heparin drip because Hgb dropped again 10/22 TPA in chest tube, repositioned endobronchial blocker 10/23 deflated endobronchial balloon 10/24 small air leak  10/25 removed bronchial blocker 10/30 tracheostomy  11/1 D/C CRRT 11/2 Transfer to [facility] 11/3 Started on IHD 11/7 trach sutures removed 11/4 present, weaning on ATC  11/11 required phenylephrine to tolerate iHD 11/13 chest tube to waterseal  Consults:  ID Nephrology Gastroenterology PCCM  Procedures:  10/2 R IJ CVL >> 10/10 10/13 Rt pig tail chest tube >> 10/14 10/14 R 12F chest tube >> 10/14 L IJ HD cath >> 11/10 10/30 tracheostomy >>  11/10 tunneled HD cath >>  Significant Diagnostic Tests:  10/2 CT angiogram chest >> extensive bilateral airspace disease predominantly posterior 10/8 doppler legs b/l >> no DVT 10/15 renal u/s >> normal 10/17 EGD >> non bleeding gastric ulcer 10/19 Echo >> EF 60 to 65%, hyperdynamic LV with small LVOT gradient 10/22 CT chest >> large collection of air in pleural space with fluid, pneumonia bilaterally R>L endobronchial blocker in place, chest tube anterior 10/31 CXR >> persistent small right pneumothorax despite chest in adequate position MRI brain 11/3 >> small acute to subacute infarcts involving bilateral cerebral cortex and right cerebellum.  Large b/l mastoid effusions  Micro Data:  9/20 SARS COV 2 >> POSITIVE 10/2 blood >> negative 10/9 blood >> MSSA 2/4 10/9 resp >> MSSA, Pseudomonas 10/13 blood >> negative 10/21 bronch wash bacterial >> pseduomonas, acinetobacter baumannii 10/21 bronch wash fungal >  10/21 bronch wash aspergillus antigen> negative 10/24 bronchoscopy wash> pseudomonas 10/28 Blood cx>>> negative  10/28 Sputum cx>>> Pseudomonas 11/1 SARS COV 2 > negative 11/2 SARS CoV2 >> negative   Antimicrobials:  10/3 remdesivir > 10/6 10/3 actemra 10/3 decadron > 10/12 10/4 convalescent plasma 10/6 convalescent plasma  10/9 vancomycin > 10/10 10/9 cefepime > 10/10 10/10 ancef > 10/13 10/13 cefepime >10/20 10/14 anidulofungin > 10/15  10/20 ancef > 10/23 10/23 meropenem > >>>off  10/21 anidulofungin> 10/22 10/22 voriconazole > 10/23  10/28 Merrem >>11/2 10/28 Vanc >>10/30  10/31 Ancef >> Tentative stop date 11/23   Interim history/subjective:  No issues Ongoing poor  cough with thick secretions No airleak on CT- TCTS w/ plans to d/c today  S/p working with PT, wife reports patient is exhausted  Objective   Blood pressure 127/71, pulse 89, temperature 98.9 F (37.2 C), temperature source Oral, resp. rate 19, height 5\' 10"  (1.778 m), weight 84 kg, SpO2 97 %.    FiO2 (%):  [28 %] 28 %   Intake/Output Summary (Last 24 hours) at 09/08/2019 1148 Last data filed at 09/08/2019 1019 Gross per 24 hour  Intake 1155 ml  Output 160 ml  Net 995 ml   Filed Weights   09/02/19 1635 09/06/19 1432 09/06/19 1840  Weight: 84 kg 85.1 kg 84 kg    Examination: General:  Older adult male sitting upright in bed in NAD HEENT: MM pink/moist, midline cuffed 8 shiley- some tan secretions, cortrak Neuro: Sleeping, not easily aroused, did not follow commands for me CV: rr, no mumur PULM:  Clear anteriorly, rales left base, diminished in right, CT to right - waterseal, no leak GI: soft, +bs Extremities: warm/dry, no edema  Skin: no rashes    Assessment & Plan:   Acute respiratory failure with hypoxemia - 2/2 COVID-19 (s/p remdsevir, actemra, convalescent plasma, steroids)  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  Now with persistent airleak / BPF despite endobronchial blocker placement (removed 10/25 due to MSSA bacteremia) - off MV since 11/2 P: Continue ATC, currently at 28% Ongoing SLP, PMV trials Ongoing Trach care, hold on downsizing at this time, can change to cuffless given last MV was 11/2 CT per Dr. 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  P: plan for cefazolin 6 weeks total, stop date 11/23  Anemia  of chronic disease with GI bleed and gastric ulcer  P:  PPI  Trend CBC/ monitor for blood loss   Rest per primary team   Best practice:  Diet: tube feeding Pain/Anxiety/Delirium protocol (if indicated):  VAP protocol (if indicated): yes DVT prophylaxis: subcutaneous heparin GI prophylaxis: Pantoprazole for stress ulcer prophylaxis Glucose control: SSI Mobility: PT  Code Status: DNR if arrests Family Communication: wife updated at bedside  Disposition: PCU    Kennieth Rad, MSN, AGACNP-BC [facility] 09/08/2019, 11:52 AM

## 2019-09-08 NOTE — Progress Notes (Signed)
Occupational Therapy Treatment Patient Details Name: [name] MRN: [id number] DOB: Mar 12, 1951 Today's Date: 09/08/2019    History of present illness 68 y/o male diagnosed with COVID on 9/29 presented to the [facility] ED on 10/2 with dyspnea, hypoxemia requiring intubation.  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  Tracheostomy placed on 10/30. Tested COVID neg x2 on 11/2. MRI on 11/4 showed There is cortical restricted diffusion posteriorly in the right frontal lobe consistent with acute infarct. Additional smaller regions of diffusion abnormality are present in the posterior left frontal lobe, anterior left frontal lobe, and right greater than left medial parietal lobes as well as right cerebellum.    OT comments  Excellent session today. PMSV used during session. Pt conversing with therapist talking about being from [employer] and playing golf. Pt able to follow commands with a delay at times. Increased strength throughout RUE (stronger distally). Flaccid LUE. While sitting EOB able to facilitate increased head and trunk extension and lateral weight shifts. Pt states his vision is "blurry all the time". Would benefit form compensatory strategies for low vision to improve ability to participate in session. VSS throughout session. Feel pt is appropriate for use of tilt bed. Pt very appreciative. Encourage nsg to place pt in chair position TID.  Follow Up Recommendations  SNF;Supervision/Assistance - 24 hour    Equipment Recommendations  Wheelchair cushion (measurements OT);Wheelchair (measurements OT);Hospital bed    Recommendations for Other Services PT consult    Precautions / Restrictions Precautions Precautions: Fall;Other (comment)(chest tube; coretrack) Required Braces or Orthoses: Other Brace(B prevalon boots)       Mobility Bed Mobility Overal bed mobility:  Needs Assistance Bed Mobility: Rolling;Sidelying to Sit;Sit to Sidelying Rolling: Max assist;+2 for physical assistance Sidelying to sit: Total assist;+2 for physical assistance       General bed mobility comments: incorpated trunk rotation and use of RLE to help with rolling; able to weight bear through RUE to hep push/lean during bed mobility  Transfers                 General transfer comment: will need Maximove    Balance Overall balance assessment: Needs assistance Sitting-balance support: Feet supported Sitting balance-Leahy Scale: Poor Sitting balance - Comments: increased abilty to control head and trunk extension. Able to make small excursions wtih ant/post weight shifts                                   ADL either performed or assessed with clinical judgement   ADL Overall ADL's : Needs assistance/impaired Eating/Feeding: NPO   Grooming: Maximal assistance;Oral care;Wash/dry face                               Functional mobility during ADLs: Total assistance;+2 for physical assistance;Maximal assistance       Vision   Vision Assessment?: Yes Eye Alignment: Impaired (comment) Alignment/Gaze Preference: Head turned(R gaze preference; able to scan into L field) Tracking/Visual Pursuits: Decreased smoothness of horizontal tracking;Decreased smoothness of vertical tracking Saccades: Impaired - to be further tested in functional context Visual Fields: Impaired-to be further tested in functional context;Left visual field deficit Additional Comments: Pt states vision is "blurry all the time" Able to scaninto L field but difficulty sutaining visual attention   Perception  Praxis      Cognition Arousal/Alertness: Awake/alert Behavior During Therapy: Flat affect Overall Cognitive Status: Impaired/Different from baseline Area of Impairment: Attention;Safety/judgement;Following commands;Awareness;Problem solving                    Current Attention Level: Sustained   Following Commands: Follows one step commands with increased time Safety/Judgement: Decreased awareness of safety;Decreased awareness of deficits Awareness: Emergent Problem Solving: Slow processing;Decreased initiation;Difficulty sequencing;Requires verbal cues General Comments: following commands with delay; verbally answering questions; using head nods at times instead of speaking - feel he forgets he ha a PMSV and can verbalize        Exercises Other Exercises Other Exercises: trunk facilitation when rolling R/L; using head turns to facilitate; reaching with RUE to facilitate; Able to control RLE movement using place and hold Other Exercises: in sitting leaning R/L using repetition to improbve movement patterns. Pt ablet o push with RUE. Increased head/neck extension; prefers R cervical rotation   Shoulder Instructions       General Comments      Pertinent Vitals/ Pain       Pain Assessment: Faces Faces Pain Scale: Hurts little more Pain Location: neck;hips Pain Descriptors / Indicators: Grimacing;Discomfort Pain Intervention(s): Limited activity within patient's tolerance;Repositioned  Home Living                                          Prior Functioning/Environment              Frequency  Min 2X/week        Progress Toward Goals  OT Goals(current goals can now be found in the care plan section)  Progress towards OT goals: Progressing toward goals(goals remain appropriate)  Acute Rehab OT Goals Patient Stated Goal: to get better OT Goal Formulation: With patient/family Time For Goal Achievement: 09/23/19 Potential to Achieve Goals: Good ADL Goals Pt Will Perform Grooming: with mod assist;sitting;bed level Additional ADL Goal #1: Pt will perform bed mobility with Mod A in preparation for ADLs Additional ADL Goal #2: Pt will tolerate sitting at EOB for 10 minutes with Min A Additional ADL Goal  #3: Pt will sustain attention to simple ADL with Mod cues  Plan Discharge plan remains appropriate    Co-evaluation    PT/OT/SLP Co-Evaluation/Treatment: Yes Reason for Co-Treatment: Complexity of the patient's impairments (multi-system involvement);For patient/therapist safety;To address functional/ADL transfers   OT goals addressed during session: ADL's and self-care      AM-PAC OT "6 Clicks" Daily Activity     Outcome Measure   Help from another person eating meals?: Total Help from another person taking care of personal grooming?: A Lot Help from another person toileting, which includes using toliet, bedpan, or urinal?: Total Help from another person bathing (including washing, rinsing, drying)?: Total Help from another person to put on and taking off regular upper body clothing?: Total Help from another person to put on and taking off regular lower body clothing?: Total 6 Click Score: 7    End of Session Equipment Utilized During Treatment: Oxygen(TC)  OT Visit Diagnosis: Unsteadiness on feet (R26.81);Other abnormalities of gait and mobility (R26.89);Muscle weakness (generalized) (M62.81)   Activity Tolerance Patient tolerated treatment well   Patient Left in bed;with call bell/phone within reach;with family/visitor present(bed in chair position)   Nurse Communication Mobility status        Time: 2297-9892 OT Time Calculation (  min): 50 min  Charges: OT General Charges $OT Visit: 1 Visit OT Treatments $Self Care/Home Management : 8-22 mins $Neuromuscular Re-education: 8-22 mins  Maurie Boettcher, OT/L   Acute OT Clinical Specialist Acute Rehabilitation Services Pager [phone] Office [phone]    [facility] 09/08/2019, 1:35 PM

## 2019-09-08 NOTE — Progress Notes (Signed)
[facility] Progress Note  Subjective: Hemodynamically stable- on trach collar- not very responsive to me this AM   Vitals:   09/08/19 0600 09/08/19 0700 09/08/19 0744 09/08/19 0800  BP: 130/76  (!) 145/79   Pulse: 90 83 93   Resp: 17 (!) 21 (!) 25   Temp:    98.9 F (37.2 C)  TempSrc:    Oral  SpO2: 98% 99%    Weight:      Height:        Inpatient medications: . aspirin  325 mg Per Tube Daily  . atorvastatin  40 mg Oral q1800  . B-complex with vitamin C  1 tablet Per Tube Daily  . chlorhexidine  15 mL Mouth/Throat BID  . Chlorhexidine Gluconate Cloth  6 each Topical Daily  . darbepoetin (ARANESP) injection - NON-DIALYSIS  100 mcg Subcutaneous Q Wed-1800  . feeding supplement (PRO-STAT SUGAR FREE 64)  30 mL Per Tube QID  . Gerhardt's butt cream   Topical TID  . heparin injection (subcutaneous)  5,000 Units Subcutaneous Q8H  . mouth rinse  15 mL Mouth Rinse 10 times per day  . pantoprazole sodium  40 mg Per Tube Daily  . sucralfate  1 g Oral Q6H   . sodium chloride Stopped (09/01/19 0000)  .  ceFAZolin (ANCEF) IV 1 g (09/07/19 2120)  . dextrose Stopped (08/16/19 1623)  . feeding supplement (NEPRO CARB STEADY) 1,000 mL (09/07/19 1436)   sodium chloride, acetaminophen (TYLENOL) oral liquid 160 mg/5 mL, dextrose, metoprolol tartrate, pneumococcal 23 valent vaccine, polyethylene glycol, polyvinyl alcohol   Exam:   Trach collar, L hemiparesis   L IJ tunneled  cath   No jvd-  Did not arouse for me today    Chest clear ant / lat, R chest tube in place    Cor reg no RG     Abd soft ntnd no ascites     Ext 2+ bilat LUE edema, no LE edema    New start ESRD here - needs 4h, hep yes    Assessment/Plan:  AKI/CKD stage 3- in setting of covid PNA and has remained oliguric/anuric, dialysis dependent since CRRT started on 10/15, dc'd on 08/24/19. Now on iHD, suspected ESRD.  TTS schedule here. Plan for HD tomorrow via [facility].  Need to start thinking about perm AV access    HTN/volume - will  pull more volume as tolerated now that CVA is behind [place], try to keep SBP > 100- 110 on HD w/ recent CVA.    New cerebral and cerebellar strokes- per primary svc, significant deficits on L side and gen'd weakness from prolonged ICU as well- reportedly better   COVID PNA- treated with remdesivir, dexamethasone, and convalescent plasma. Most recent covid test negative and is off of airborne precautions  Resp failure/ sp trach - on trach collar 5L per pulm  Right pneumothorax/emphyema/necrotic RLL- s/p chest tube with air leak. Per CT Surgery he is not a candidate for VATS  A fib/RVR- per PCCM  GIB- acute s/p EGD which showed gastric ulcer with stigmata of recent bleeding s/p injection and clipping. On ESA and need recent iron stores -no heparin with HD for now   MSSA bacteremia- treated  Acinetobacter and pseudomonal pna- sp abx course  Sepsis/shock- resolved  Dispo--  Does he need an LTAC eval ?  Not candidate for OP HD with trach     [name]   09/08/2019, 8:36 AM  Iron/TIBC/Ferritin/ %Sat  Component Value Date/Time   IRON 71 08/20/2019 1643   TIBC 217 (L) 08/20/2019 1643   FERRITIN 681 (H) 08/06/2019 0610   IRONPCTSAT 33 08/20/2019 1643   Recent Labs  Lab 09/02/19 0649  09/07/19 0321  NA  --    < > 137  K  --    < > 4.1  CL  --    < > 95*  CO2  --    < > 26  GLUCOSE  --    < > 98  BUN  --    < > 41*  CREATININE  --    < > 2.92*  CALCIUM  --    < > 8.4*  PHOS  --   --  3.5  ALBUMIN  --   --  1.5*  INR 1.2  --   --    < > = values in this interval not displayed.   No results for input(s): AST, ALT, ALKPHOS, BILITOT, PROT in the last 168 hours. Recent Labs  Lab 09/08/19 0421  WBC 13.9*  HGB 7.6*  HCT 24.0*  PLT 264

## 2019-09-08 NOTE — Progress Notes (Signed)
30 Days Post-Op Procedure(s) (LRB): ESOPHAGOGASTRODUODENOSCOPY (EGD) (N/A) HEMOSTASIS CONTROL HEMOSTASIS CLIP PLACEMENT Subjective: Minimally responsive, won't cough on request  Objective: Vital signs in last 24 hours: Temp:  [98.7 F (37.1 C)-99 F (37.2 C)] 98.9 F (37.2 C) (11/16 0800) Pulse Rate:  [77-99] 89 (11/16 0800) Cardiac Rhythm: Normal sinus rhythm (11/16 0800) Resp:  [16-25] 19 (11/16 0800) BP: (127-175)/(70-87) 127/71 (11/16 0800) SpO2:  [92 %-100 %] 97 % (11/16 0800) FiO2 (%):  [28 %] 28 % (11/16 0744)  Hemodynamic parameters for last 24 hours:    Intake/Output from previous day: 11/15 0701 - 11/16 0700 In: 1190 [NG/GT:1140; IV Piggyback:50] Out: 100 [Stool:100] Intake/Output this shift: Total I/O In: -  Out: 60 [Stool:50; Chest Tube:10]  no air leak noted while observing for several minutes  Lab Results: Recent Labs    09/07/19 0321 09/08/19 0421  WBC 14.4* 13.9*  HGB 8.5* 7.6*  HCT 26.7* 24.0*  PLT 277 264   BMET:  Recent Labs    09/07/19 0321  NA 137  K 4.1  CL 95*  CO2 26  GLUCOSE 98  BUN 41*  CREATININE 2.92*  CALCIUM 8.4*    PT/INR: No results for input(s): LABPROT, INR in the last 72 hours. ABG    Component Value Date/Time   PHART 7.401 08/23/2019 0453   HCO3 25.7 08/23/2019 0453   TCO2 27 08/23/2019 0453   ACIDBASEDEF 2.0 08/14/2019 0515   O2SAT 93.0 08/23/2019 0453   CBG (last 3)  Recent Labs    09/07/19 2337 09/08/19 0321 09/08/19 0752  GLUCAP 108* 101* 92    Assessment/Plan: S/P Procedure(s) (LRB): ESOPHAGOGASTRODUODENOSCOPY (EGD) (N/A) HEMOSTASIS CONTROL HEMOSTASIS CLIP PLACEMENT -No air leak over past several days Minimal drainage and no change in CXR Will dc chest tube  LOS: 45 days    Melrose Nakayama 09/08/2019

## 2019-09-08 NOTE — Progress Notes (Signed)
Physical Therapy Treatment Patient Details Name: [name] MRN: [id number] DOB: 02/23/1951 Today's Date: 09/08/2019    History of Present Illness 68 y/o male diagnosed with COVID on 9/29 presented to the [facility] ED on 10/2 with dyspnea, hypoxemia requiring intubation.  Admitted to [facility], developed AKI requiring CRRT, pneumothorax requiring chest tube and had a large bronchopleural fistula treated for 10 days with a bronchial blocker.  Tracheostomy placed on 10/30. Tested COVID neg x2 on 11/2. MRI on 11/4 showed There is cortical restricted diffusion posteriorly in the right frontal lobe consistent with acute infarct. Additional smaller regions of diffusion abnormality are present in the posterior left frontal lobe, anterior left frontal lobe, and right greater than left medial parietal lobes as well as right cerebellum.     PT Comments    Pt making excellent progress towards his goals today. 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 Pt is conversant today, although needs to be reminded he has PMSV for talking. Pt continues to be flaccid on L side however has increased R sided strength in both UE and LE. Focused on improving pt attention to L side and improved cervical motion to facilitate turning head to neutral and to left. Given pt improvement recommend placement on tilt bed to improve weightbearing through L side. Pt placed in chair position and recommended to RN to perform TID. PT will continue to follow acutely.   Follow Up Recommendations  SNF     Equipment Recommendations  Other (comment)(TBD at next venue)       Precautions / Restrictions Precautions Precautions: Fall;Other (comment)(chest tube; coretrack) Required Braces or Orthoses: Other Brace(B prevalon boots) Restrictions Weight Bearing Restrictions: No    Mobility  Bed Mobility Overal bed mobility: Needs Assistance Bed Mobility: Rolling;Sidelying to Sit;Sit to Sidelying Rolling:  Max assist;+2 for physical assistance Sidelying to sit: Total assist;+2 for physical assistance       General bed mobility comments: incorpated trunk rotation and use of RLE to help with rolling; able to weight bear through RUE to hep push/lean during bed mobility  Transfers                 General transfer comment: will need Maximove        Balance Overall balance assessment: Needs assistance Sitting-balance support: Feet supported Sitting balance-Leahy Scale: Poor Sitting balance - Comments: increased abilty to control head and trunk extension. Able to make small excursions wtih ant/post weight shifts                                    Cognition Arousal/Alertness: Awake/alert Behavior During Therapy: Flat affect Overall Cognitive Status: Impaired/Different from baseline Area of Impairment: Attention;Safety/judgement;Following commands;Awareness;Problem solving                   Current Attention Level: Sustained   Following Commands: Follows one step commands with increased time Safety/Judgement: Decreased awareness of safety;Decreased awareness of deficits Awareness: Emergent Problem Solving: Slow processing;Decreased initiation;Difficulty sequencing;Requires verbal cues General Comments: following commands with delay; verbally answering questions; using head nods at times instead of speaking - feel he forgets he ha a PMSV and can verbalize      Exercises Other Exercises Other Exercises: trunk facilitation when rolling R/L; using head turns to facilitate; reaching with RUE to facilitate; Able to control RLE movement using place and hold Other Exercises: in sitting leaning  R/L using repetition to improve movement patterns. Pt able to push with RUE. Increased head/neck extension; prefers R cervical rotation    General Comments General comments (skin integrity, edema, etc.): VSS throughout      Pertinent Vitals/Pain Pain Assessment:  Faces Faces Pain Scale: Hurts little more Pain Location: neck;hips Pain Descriptors / Indicators: Grimacing;Discomfort Pain Intervention(s): Limited activity within patient's tolerance;Monitored during session;Repositioned           PT Goals (current goals can now be found in the care plan section) Acute Rehab PT Goals Patient Stated Goal: to get better PT Goal Formulation: With family Time For Goal Achievement: 09/09/19 Potential to Achieve Goals: Fair Progress towards PT goals: Progressing toward goals    Frequency    Min 3X/week      PT Plan Current plan remains appropriate    Co-evaluation PT/OT/SLP Co-Evaluation/Treatment: Yes Reason for Co-Treatment: Complexity of the patient's impairments (multi-system involvement);Necessary to address cognition/behavior during functional activity PT goals addressed during session: Mobility/safety with mobility OT goals addressed during session: ADL's and self-care      AM-PAC PT "6 Clicks" Mobility   Outcome Measure  Help needed turning from your back to your side while in a flat bed without using bedrails?: Total Help needed moving from lying on your back to sitting on the side of a flat bed without using bedrails?: Total Help needed moving to and from a bed to a chair (including a wheelchair)?: Total Help needed standing up from a chair using your arms (e.g., wheelchair or bedside chair)?: Total Help needed to walk in hospital room?: Total Help needed climbing 3-5 steps with a railing? : Total 6 Click Score: 6    End of Session Equipment Utilized During Treatment: Oxygen Activity Tolerance: Patient limited by lethargy Patient left: in bed;with call bell/phone within reach;with bed alarm set;with family/visitor present Nurse Communication: Mobility status PT Visit Diagnosis: Unsteadiness on feet (R26.81);Other abnormalities of gait and mobility (R26.89);Muscle weakness (generalized) (M62.81);Difficulty in walking, not  elsewhere classified (R26.2)     Time: 8786-7672 PT Time Calculation (min) (ACUTE ONLY): 50 min  Charges:  $Therapeutic Activity: 8-22 mins                     [name] B. [name] PT, DPT Acute Rehabilitation Services Pager [phone] Office [phone]    [place] 09/08/2019, 2:21 PM

## 2019-09-08 NOTE — Procedures (Signed)
Tracheostomy Change Note  Patient Details:   Name: Adain Geurin DOB: 1951/09/05 MRN: [id number]    Airway Documentation:     Evaluation  O2 sats: stable throughout Complications: No apparent complications Patient did tolerate procedure well. Bilateral Breath Sounds: Rhonchi    Mcneil Sober 09/08/2019, 4:33 PM

## 2019-09-09 ENCOUNTER — Inpatient Hospital Stay (HOSPITAL_COMMUNITY): Payer: Medicare HMO

## 2019-09-09 DIAGNOSIS — J9311 Primary spontaneous pneumothorax: Secondary | ICD-10-CM | POA: Diagnosis not present

## 2019-09-09 DIAGNOSIS — U071 COVID-19: Secondary | ICD-10-CM | POA: Diagnosis not present

## 2019-09-09 DIAGNOSIS — J1289 Other viral pneumonia: Secondary | ICD-10-CM | POA: Diagnosis not present

## 2019-09-09 LAB — GLUCOSE, CAPILLARY
Glucose-Capillary: 95 mg/dL (ref 70–99)
Glucose-Capillary: 95 mg/dL (ref 70–99)
Glucose-Capillary: 103 mg/dL — ABNORMAL HIGH (ref 70–99)
Glucose-Capillary: 106 mg/dL — ABNORMAL HIGH (ref 70–99)
Glucose-Capillary: 101 mg/dL — ABNORMAL HIGH (ref 70–99)
Glucose-Capillary: 111 mg/dL — ABNORMAL HIGH (ref 70–99)

## 2019-09-09 LAB — MAGNESIUM
Magnesium: 2.6 mg/dL — ABNORMAL HIGH (ref 1.7–2.4)
Magnesium: 2.4 mg/dL (ref 1.7–2.4)

## 2019-09-09 LAB — IRON AND TIBC
Iron: 26 ug/dL — ABNORMAL LOW (ref 45–182)
Saturation Ratios: 15 % — ABNORMAL LOW (ref 17.9–39.5)
TIBC: 178 ug/dL — ABNORMAL LOW (ref 250–450)
UIBC: 152 ug/dL

## 2019-09-09 LAB — COMPREHENSIVE METABOLIC PANEL
ALT: 7 U/L (ref 0–44)
AST: 37 U/L (ref 15–41)
Albumin: 1.5 g/dL — ABNORMAL LOW (ref 3.5–5.0)
Alkaline Phosphatase: 96 U/L (ref 38–126)
Anion gap: 15 (ref 5–15)
BUN: 106 mg/dL — ABNORMAL HIGH (ref 8–23)
CO2: 26 mmol/L (ref 22–32)
Calcium: 8.5 mg/dL — ABNORMAL LOW (ref 8.9–10.3)
Chloride: 92 mmol/L — ABNORMAL LOW (ref 98–111)
Creatinine, Ser: 5.91 mg/dL — ABNORMAL HIGH (ref 0.61–1.24)
GFR calc Af Amer: 11 mL/min — ABNORMAL LOW (ref >60)
GFR calc non Af Amer: 9 mL/min — ABNORMAL LOW (ref >60)
Glucose, Bld: 110 mg/dL — ABNORMAL HIGH (ref 70–99)
Potassium: 3.2 mmol/L — ABNORMAL LOW (ref 3.5–5.1)
Sodium: 133 mmol/L — ABNORMAL LOW (ref 135–145)
Total Bilirubin: 0.6 mg/dL (ref 0.3–1.2)
Total Protein: 6.1 g/dL — ABNORMAL LOW (ref 6.5–8.1)

## 2019-09-09 LAB — PHOSPHORUS: Phosphorus: 4.6 mg/dL (ref 2.5–4.6)

## 2019-09-09 LAB — FERRITIN: Ferritin: 879 ng/mL — ABNORMAL HIGH (ref 24–336)

## 2019-09-09 LAB — PREPARE RBC (CROSSMATCH)

## 2019-09-09 MED ORDER — WHITE PETROLATUM EX OINT
1.0000 "application " | TOPICAL_OINTMENT | CUTANEOUS | Status: DC | PRN
  Filled 2019-09-09: qty 5

## 2019-09-09 MED ORDER — HEPARIN SODIUM (PORCINE) 1000 UNIT/ML IJ SOLN
INTRAMUSCULAR | Status: AC
  Filled 2019-09-09: qty 4

## 2019-09-09 MED ORDER — ALBUMIN HUMAN 25 % IV SOLN
INTRAVENOUS | Status: AC
  Administered 2019-09-09: 25 g via INTRAVENOUS
  Filled 2019-09-09: qty 100

## 2019-09-09 MED ORDER — SODIUM CHLORIDE 0.9% IV SOLUTION: Freq: Once | INTRAVENOUS | Status: DC

## 2019-09-09 MED ORDER — ALBUMIN HUMAN 25 % IV SOLN
25.0000 g | Freq: Once | INTRAVENOUS | Status: AC
  Administered 2019-09-09: 13:00:00 25 g via INTRAVENOUS

## 2019-09-09 MED ORDER — DARBEPOETIN ALFA 100 MCG/0.5ML IJ SOSY
100.0000 ug | PREFILLED_SYRINGE | INTRAMUSCULAR | Status: DC
  Administered 2019-09-11: 100 ug via INTRAVENOUS
  Filled 2019-09-09: qty 0.5

## 2019-09-09 NOTE — Progress Notes (Signed)
Pt's rhythm changed to a widened complex. EKG done showing NSR, possible L atrial enlargement, nonspecific intraventricular block, possible R ventricular hypertrophy, T wave abnormality, consider anterior ischemia. Pt denies chest pain/SOB, VSS. Baltazar Najjar, NP notified of change. Will continue to monitor pt.

## 2019-09-09 NOTE — Progress Notes (Signed)
[facility]  PROGRESS NOTE  Irwin Toran LGX:[id number] DOB: 11-Jun-1951 DOA: 07/25/2019 PCP: Kathyrn Drown, MD Admit date - 07/25/2019   Admitting Physician Kristopher Oppenheim, DO  Outpatient Primary MD for the patient is Kathyrn Drown, MD  LOS - 33 Brief Narrative   Vontae Court is a 68 y.o. year old male with medical history significant for GERD and BPH who initially presented on 10/2//20 with worsening SOB x 5 days after recently testing positive for COVID-19 (07/22/19, in Epic). EMS found him to be tachypneic with profound hypoxia to 40s  requiring nonrebreather, upon arrival to [facility] ED he required emergent intubation. CXR and CTA chest showed multifocal consolidation, and ground glass opacity, negative for PE. Marland Kitchen Patient was admitted to [facility] for Acute hypoxic respiratory failure secondary to severe ARDS from COVID pneumonia and started on decadron, remdesevir and toclilizumab.  Prolonged Hospital course complicated by CXKGY-18 pneumonia, ARDS, AKI requiring CRRT, encephalopathy, pneumothorax complicated by persistent air leak and bronchopleural fistula requiring endobronchial blocker and Pleur-evac, prolonged mechanical ventilation status post tracheostomy on 10/30, MSSA bacteremia with negative echocardiogram, intermittent pressor support required with dialysis, melena with acute blood loss anemia requiring transfusion, IV protonix, empiric octreotide(previous history of significant ethanol use) and EGD with gastric ulcer and nonbleeding visible vessel clipped, transferred to [place] on 11/3 for intermittent HD, subacute right cerebral/cerebellar infarcts on MRI treated with aspirin on 11/4.  Subjective  Mr. Okazaki today is sleeping after working hard with PT earlier today. He had his chest tube removed today by CT surgery.  His wife is not at bedside today.  A & P    Acute hypoxic respiratory failure with severe ARDS secondary to Covid pneumonia, stable.  Completed  remdesivir/Decadron/actemra/convalescent plasma therapy.  ARDS resolved. Covid test negative x2 (11/1, 11/2).  Stable on trach collar per PCCM chronically requiring 5 L. O2 went slightly up today after Chest tube removed maintaining normal oxygen saturation currently.   -progressive unit given need for close respiratory monitoring -serial CXR per CTS   AKI on CKD Stage 3( baseline cr around 1.5),likely secondary to ATN related to septic shock. requiring HD TTS in hospital.  Has tolerated HD without pressors.  - HD per nephrology via temporary dialysis catheter - need to start thinking about permanent access  Septic shock, resolved,   No longer requiring as needed pressors during dialysis,   Right pneumothorax/empyema/necrotic right lower lobe/bronchopleural fistula with large air leak, stable.  Chest tube removed by CT surgery on 11/16  - closely monitor, serial cxr   Acute on chronic anemia secondary to gastric ulcer status post EGD with culprit vessel clipping.  Hemoglobin stable/transfusion of 1 unit on 11/13.   -Continue PPI twice daily.  Daily CBC   H. Pylori positive by EGD biopsy.  -GI recommends waiting unitl clinically stable after discharge and starting 2 week regimen to prevent ulcer recurrence. GI (per Dr. Cristina Gong 10/27) to follow up in 2-3 months from hospitalization to arrange this and possible f/u EGD.   Small bilateral cerebral/right cerebellum infarcts(11/3) likely due to hypotension secondary to dialysis and sepsis and deconditioning. PAF is also a potential etiology.  Mental status has improved significantly over last 48 hours, has left hemiparesis with improved strength on left and able to speak with use of PMV.  -  Neurology recommends aspirin 325 mg, permissive hypertension (goal BP 120-140),  -no anticoagulation given history of GI bleed.  -F/u 4 wks after discharge at stroke clinic [facility] at [facility]. -  working with PT likely LTAC vs SNF  Anoxic brain injury. Per neurology  bilateral fronal gyrus ribboningon MRI (11/3)consistent with cortical ischemia consistent with anoxic brain injury. Has had frequent episodes of hypotension.  -Goal Bp 120-140  Atrial fibrillation, currently rate controlled in sinus rhythm. unable to fully anticoagulate due to recent GI bleeding,  Briefly required amiodarone earlier in hospitalization.  -Monitor on telemetry. -continue aspirin  Sinus tachycardia, resolved. Likely related to previously low hgb.   MSSA bacteremia,, TTE negative but still high concern for infective endocarditis  -continue cefazolin, stop date 11/23 (6 total weeks)  Pseudomonas and Acinetobacter in BAL specimen, resolved.  Completed 10-day treatment of meropenem  Elevated D-dimer. > 20 on 10/19, down to 8/83 on 10/23 no full anticoagulation with prior GI bleed. Dopplers negative for DVT on 10/8. Likely related to right lung empyema.   Pressure injuries, not present on admission.  Pressure Injury 08/24/19 Sacrum Right;Left;Mid Stage II -  Partial thickness loss of dermis presenting as a shallow open ulcer with a red, pink wound bed without slough.  (Active)  08/24/19 0800  Location: Sacrum  Location Orientation: Right;Left;Mid  Staging: Stage II -  Partial thickness loss of dermis presenting as a shallow open ulcer with a red, pink wound bed without slough.  Wound Description (Comments): -- (stage 11 mid sacrum)  Present on Admission: No    Goals of care,guarded prognosis.  Patient family currently elected to continue full scope of medical treatment, appreciate palliative care team assistance.     Family Communication  : Spoke with His wife Mrs. Cragg at bedside on 11/15  Code Status : DNR  Disposition Plan  : Guarded prognosis, continue medical management, doing much better mental status greatly improved, respiratory status stable but still needs close monitoring with chest tube out.   Consults  : ID, nephrology, GI, neurology PCCM,  Palliative  Procedures  :  Chest tube removed, 11/16 Tunneled HD cath, 11/10 HD, 11/3 Tracheostomy 10/30 EGD 10/17. Gastric ulcer, nonbleeding visible vessel clipped CRRT 10/15 Bronchoscopy with endobronchial ballon, 10/15, repeat 10/16 Right-sided chest tube, 10/14 Right pigtail chest tube--for right PTX 10/13-2/14 Right IJ 10/2-10/10 ETT 10/2        COVID-19 specific Treatment:  Decadron 10/2 > 10/12  Remdesivir 10/2 > 10/6  Actemra 10/2  Convalescent plasma 10/3   DVT Prophylaxis  : SCDs  Lab Results  Component Value Date   PLT 264 09/08/2019    Diet :  Diet Order            Diet NPO time specified  Diet effective midnight               Inpatient Medications Scheduled Meds:  aspirin  325 mg Per Tube Daily   atorvastatin  40 mg Oral q1800   B-complex with vitamin C  1 tablet Per Tube Daily   chlorhexidine  15 mL Mouth/Throat BID   Chlorhexidine Gluconate Cloth  6 each Topical Daily   Chlorhexidine Gluconate Cloth  6 each Topical Q0600   darbepoetin (ARANESP) injection - NON-DIALYSIS  100 mcg Subcutaneous Q Wed-1800   feeding supplement (PRO-STAT SUGAR FREE 64)  30 mL Per Tube QID   Gerhardt's butt cream   Topical TID   heparin injection (subcutaneous)  5,000 Units Subcutaneous Q8H   mouth rinse  15 mL Mouth Rinse 10 times per day   pantoprazole sodium  40 mg Per Tube Daily   sucralfate  1 g Oral Q6H   Continuous Infusions:  sodium chloride Stopped (09/01/19 0000)    ceFAZolin (ANCEF) IV Stopped (09/08/19 2237)   dextrose Stopped (08/16/19 1623)   feeding supplement (NEPRO CARB STEADY) 45 mL/hr at 09/08/19 1900   PRN Meds:.sodium chloride, acetaminophen (TYLENOL) oral liquid 160 mg/5 mL, dextrose, metoprolol tartrate, pneumococcal 23 valent vaccine, polyethylene glycol, polyvinyl alcohol  Antibiotics  :   Anti-infectives (From admission, onward)   Start     Dose/Rate Route Frequency Ordered Stop   08/26/19 2200  ceFAZolin (ANCEF)  IVPB 1 g/50 mL premix     1 g 100 mL/hr over 30 Minutes Intravenous Every 24 hours 08/24/19 1021 09/15/19 2359   08/24/19 1100  meropenem (MERREM) 1 g in sodium chloride 0.9 % 100 mL IVPB     1 g 200 mL/hr over 30 Minutes Intravenous Every 24 hours 08/24/19 1021 08/25/19 1156   08/23/19 1100  ceFAZolin (ANCEF) IVPB 2g/100 mL premix  Status:  Discontinued     2 g 200 mL/hr over 30 Minutes Intravenous Every 12 hours 08/23/19 1048 08/24/19 1021   08/21/19 1700  vancomycin (VANCOCIN) IVPB 750 mg/150 ml premix  Status:  Discontinued     750 mg 150 mL/hr over 60 Minutes Intravenous Every 24 hours 08/21/19 0843 08/23/19 1048   08/21/19 1400  vancomycin (VANCOCIN) IVPB 750 mg/150 ml premix  Status:  Discontinued     750 mg 150 mL/hr over 60 Minutes Intravenous Every 24 hours 08/20/19 1146 08/20/19 1218   08/21/19 1000  meropenem (MERREM) 1 g in sodium chloride 0.9 % 100 mL IVPB  Status:  Discontinued     1 g 200 mL/hr over 30 Minutes Intravenous Every 12 hours 08/20/19 1535 08/23/19 1048   08/21/19 0413  meropenem (MERREM) 500 mg in sodium chloride 0.9 % 100 mL IVPB  Status:  Discontinued     500 mg 200 mL/hr over 30 Minutes Intravenous Every 24 hours 08/20/19 1245 08/20/19 1535   08/20/19 1330  vancomycin (VANCOCIN) 1,750 mg in sodium chloride 0.9 % 500 mL IVPB     1,750 mg 250 mL/hr over 120 Minutes Intravenous  Once 08/20/19 1222 08/20/19 1619   08/20/19 1300  vancomycin (VANCOCIN) 1,250 mg in sodium chloride 0.9 % 250 mL IVPB  Status:  Discontinued     1,250 mg 166.7 mL/hr over 90 Minutes Intravenous  Once 08/20/19 1146 08/20/19 1218   08/20/19 1224  vancomycin variable dose per unstable renal function (pharmacist dosing)  Status:  Discontinued      Does not apply See admin instructions 08/20/19 1225 08/21/19 1152   08/15/19 1600  meropenem (MERREM) 1 g in sodium chloride 0.9 % 100 mL IVPB  Status:  Discontinued     1 g 200 mL/hr over 30 Minutes Intravenous Every 12 hours 08/15/19 1543  08/20/19 1245   08/15/19 1000  voriconazole (VFEND) tablet 200 mg  Status:  Discontinued     200 mg Per Tube Every 12 hours 08/14/19 1148 08/15/19 1134   08/14/19 1600  anidulafungin (ERAXIS) 100 mg in sodium chloride 0.9 % 100 mL IVPB  Status:  Discontinued     100 mg 78 mL/hr over 100 Minutes Intravenous Every 24 hours 08/13/19 1645 08/14/19 1148   08/14/19 1300  voriconazole (VFEND) tablet 400 mg     400 mg Per Tube Every 12 hours 08/14/19 1148 08/14/19 2217   08/13/19 1700  anidulafungin (ERAXIS) 200 mg in sodium chloride 0.9 % 200 mL IVPB     200 mg 78 mL/hr over 200 Minutes Intravenous  Once 08/13/19 1645 08/13/19 2030   08/13/19 0200  ceFAZolin (ANCEF) IVPB 2g/100 mL premix  Status:  Discontinued     2 g 200 mL/hr over 30 Minutes Intravenous Every 12 hours 08/11/19 1512 08/15/19 1543   08/12/19 0200  ceFEPIme (MAXIPIME) 2 g in sodium chloride 0.9 % 100 mL IVPB     2 g 200 mL/hr over 30 Minutes Intravenous Every 12 hours 08/11/19 1509 08/12/19 1341   08/07/19 2200  ceFEPIme (MAXIPIME) 2 g in sodium chloride 0.9 % 100 mL IVPB  Status:  Discontinued     2 g 200 mL/hr over 30 Minutes Intravenous Every 12 hours 08/07/19 1540 08/11/19 1509   08/07/19 1000  anidulafungin (ERAXIS) 100 mg in sodium chloride 0.9 % 100 mL IVPB  Status:  Discontinued     100 mg 78 mL/hr over 100 Minutes Intravenous Every 24 hours 08/06/19 0934 08/08/19 1203   08/07/19 0800  ceFEPIme (MAXIPIME) 2 g in sodium chloride 0.9 % 100 mL IVPB  Status:  Discontinued     2 g 200 mL/hr over 30 Minutes Intravenous Every 24 hours 08/06/19 1036 08/07/19 1540   08/06/19 1000  anidulafungin (ERAXIS) 200 mg in sodium chloride 0.9 % 200 mL IVPB    Note to Pharmacy: please   200 mg 78 mL/hr over 200 Minutes Intravenous Every 24 hours 08/06/19 0925 08/06/19 1600   08/06/19 0800  ceFEPIme (MAXIPIME) 2 g in sodium chloride 0.9 % 100 mL IVPB  Status:  Discontinued     2 g 200 mL/hr over 30 Minutes Intravenous Every 12 hours  08/06/19 0733 08/06/19 1036   08/02/19 2200  ceFAZolin (ANCEF) IVPB 2g/100 mL premix  Status:  Discontinued     2 g 200 mL/hr over 30 Minutes Intravenous Every 8 hours 08/02/19 1338 08/06/19 0733   08/02/19 1400  vancomycin (VANCOCIN) 1,250 mg in sodium chloride 0.9 % 250 mL IVPB  Status:  Discontinued     1,250 mg 166.7 mL/hr over 90 Minutes Intravenous Every 24 hours 08/01/19 1320 08/02/19 1332   08/01/19 1330  vancomycin (VANCOCIN) 2,000 mg in sodium chloride 0.9 % 500 mL IVPB     2,000 mg 250 mL/hr over 120 Minutes Intravenous  Once 08/01/19 1158 08/01/19 1636   08/01/19 1300  ceFEPIme (MAXIPIME) 2 g in sodium chloride 0.9 % 100 mL IVPB  Status:  Discontinued     2 g 200 mL/hr over 30 Minutes Intravenous Every 12 hours 08/01/19 1158 08/02/19 1332   07/26/19 1600  remdesivir 100 mg in sodium chloride 0.9 % 250 mL IVPB     100 mg 500 mL/hr over 30 Minutes Intravenous Every 24 hours 07/26/19 0132 07/29/19 1823   07/26/19 0215  remdesivir 200 mg in sodium chloride 0.9 % 250 mL IVPB     200 mg 500 mL/hr over 30 Minutes Intravenous Once 07/26/19 0132 07/26/19 0520       Objective   Vitals:   09/09/19 0414 09/09/19 0500 09/09/19 0600 09/09/19 0700  BP: 135/72 135/81 (!) 146/80 139/83  Pulse: 88 88 93 90  Resp: 19 (!) 21 20 (!) 22  Temp:      TempSrc:      SpO2:  95% 96% 98%  Weight:   87.3 kg   Height:        SpO2: 98 % O2 Flow Rate (L/min): 5 L/min FiO2 (%): 35 %  Wt Readings from Last 3 Encounters:  09/09/19 87.3 kg  11/08/17 96.2 kg  09/04/16 94.3  kg     Intake/Output Summary (Last 24 hours) at 09/09/2019 0834 Last data filed at 09/09/2019 0700 Gross per 24 hour  Intake 1670 ml  Output 25 ml  Net 1645 ml    Physical Exam:  Lying in bed resting comfortably LIJ temp cath R chest tube  Removed Trach collar in place, 8 L, normal oxygen saturation with no respiratory distress Regular rate and rhythm Rectal tube in place 1+ bilateral upper extremity  edema Boots in place on lower extremities Able to wiggle right toes and squeeze right hand on command ( unchanged), L hemiparesis    I have personally reviewed the following:   Data Reviewed:  CBC Recent Labs  Lab 09/05/19 0922 09/05/19 2016 09/06/19 0951 09/07/19 0321 09/08/19 0421  WBC 14.0* 13.3* 15.6* 14.4* 13.9*  HGB 6.8* 8.4* 8.0* 8.5* 7.6*  HCT 21.8* 26.1* 24.6* 26.7* 24.0*  PLT 309 290 296 277 264  MCV 92.0 90.6 89.8 90.5 90.6  MCH 28.7 29.2 29.2 28.8 28.7  MCHC 31.2 32.2 32.5 31.8 31.7  RDW 15.5 15.9* 15.6* 15.3 14.5    Chemistries  Recent Labs  Lab 09/03/19 0440 09/04/19 0730 09/05/19 0922 09/07/19 0321  NA 135 135 134* 137  K 4.2 4.5 3.9 4.1  CL 98 94* 93* 95*  CO2 _0 GLUCOSE 118* 103* 100* 98  BUN 75* 112* 80* 41*  CREATININE 4.26* 5.76* 4.44* 2.92*  CALCIUM 8.3* 8.7* 8.7* 8.4*   ------------------------------------------------------------------------------------------------------------------ No results for input(s): CHOL, HDL, LDLCALC, TRIG, CHOLHDL, LDLDIRECT in the last 72 hours.  Lab Results  Component Value Date   HGBA1C 6.1 (H) 07/26/2019   ------------------------------------------------------------------------------------------------------------------ No results for input(s): TSH, T4TOTAL, T3FREE, THYROIDAB in the last 72 hours.  Invalid input(s): FREET3 ------------------------------------------------------------------------------------------------------------------ No results for input(s): VITAMINB12, FOLATE, FERRITIN, TIBC, IRON, RETICCTPCT in the last 72 hours.  Coagulation profile No results for input(s): INR, PROTIME in the last 168 hours.  No results for input(s): DDIMER in the last 72 hours.  Cardiac Enzymes No results for input(s): CKMB, TROPONINI, MYOGLOBIN in the last 168 hours.  Invalid input(s):  CK ------------------------------------------------------------------------------------------------------------------ No results found for: BNP  Micro Results No results found for this or any previous visit (from the past 240 hour(s)).  Radiology Reports Ct Chest Wo Contrast  Result Date: 09/01/2019 CLINICAL DATA:  Coronavirus infection. Chest tube placement for right pneumothorax. EXAM: CT CHEST WITHOUT CONTRAST TECHNIQUE: Multidetector CT imaging of the chest was performed following the standard protocol without IV contrast. COMPARISON:  Chest radiography same day. CT chest 08/13/2019 FINDINGS: Cardiovascular: Heart size upper limits of normal. Minimal aortic atherosclerosis. No acute vascular finding visible. Mediastinum/Nodes: No mass or lymphadenopathy. Lungs/Pleura: Tracheostomy remains in place. Widespread patchy pulmonary infiltrates in the left lung persist, improved since late October. Better aeration in the left lower lobe. On the right, chest tube remains in place anteriorly, in direct communication with pleural air. Total amount of pleural air estimated at 5-10%. No evidence that this is increasing. Widespread patchy pulmonary infiltrates as seen previously, with a large air cyst in the right lower lung measuring 9.5 x 7.7 x 7.1 cm. I think this is in the right middle lobe, but find it difficult to state that with certainty. Some chance this could be in the right lower lobe, less likely. In any case, this is likely subsequent to barotrauma. Upper Abdomen: Negative Musculoskeletal: Negative IMPRESSION: 1. Right chest tube remains in place anteriorly, in direct communication with pleural air. Total amount of  pleural air estimated at 5-10%. 2. Widespread patchy pulmonary infiltrates, improved since late October. Better aeration in the left lower lobe. 3. Large air cyst in the right lower lung measuring 9.5 x 7.7 x 7.1 cm. This is presumably subsequent to barotrauma. This is slightly larger than  on the study of 08/13/2019. 4. Tracheostomy remains in place. Aortic Atherosclerosis (ICD10-I70.0). Electronically Signed   By: Nelson Chimes M.D.   On: 09/01/2019 15:53   Ct Chest Wo Contrast  Result Date: 08/13/2019 CLINICAL DATA:  COVID-19 positivity, follow-up patchy infiltrates EXAM: CT CHEST WITHOUT CONTRAST TECHNIQUE: Multidetector CT imaging of the chest was performed following the standard protocol without IV contrast. COMPARISON:  07/25/2019 FINDINGS: Cardiovascular: Examination is somewhat limited due to lack of IV contrast. Atherosclerotic calcifications of the thoracic aorta are seen. No cardiac enlargement is noted. No aneurysmal dilatation is seen. Right-sided central line is noted extending into the cavoatrial junction. Left jugular central line is noted as well. Mediastinum/Nodes: Endotracheal tube and gastric catheter are seen. A blocking catheter is noted within the right lower lobe bronchi stable from prior exams. Thoracic inlet appears within normal limits. No sizable mediastinal or hilar adenopathy is noted. The esophagus is within normal limits as visualized. Lungs/Pleura: The lungs demonstrate patchy infiltrates similar to that seen on prior CT although the degree of consolidation has improved in the interval from the prior exam. Persistent right lower lobe and left lower lobe consolidation is noted. Complicated pleural effusion is noted on the right. A considerable amount of air is noted within the pleural effusion as well as within the pleural space. Chest tube is noted anteriorly. The air is likely related to the recent bronchoscopy and manipulation of the bronchial occlusion tube. Upper Abdomen: Visualized upper abdomen shows gastric catheter within the stomach as well as multiple ligation clips within the distal stomach. Musculoskeletal: No chest wall mass or suspicious bone lesions identified. IMPRESSION: Improved aeration within the lungs when compared with the prior CT  examination. There remains consolidation within the lower lobes bilaterally worse on the right than the left. Complicated pleural effusion on the right is noted with chest tube in place. The air within the pleural space is likely related to the recent manipulation of the bronchial occlusion tube. Tubes and lines as described above. Aortic Atherosclerosis (ICD10-I70.0). Electronically Signed   By: Inez Catalina M.D.   On: 08/13/2019 15:32   Mr Brain Wo Contrast  Result Date: 08/26/2019 CLINICAL DATA:  Altered level of consciousness. History of COVID-19. EXAM: MRI HEAD WITHOUT CONTRAST TECHNIQUE: Multiplanar, multiecho pulse sequences of the brain and surrounding structures were obtained without intravenous contrast. COMPARISON:  Head CT 01/05/2014 FINDINGS: Brain: There is cortical restricted diffusion posteriorly in the right frontal lobe consistent with acute infarct. Additional smaller regions of diffusion abnormality are present in the posterior left frontal lobe, anterior left frontal lobe, and right greater than left medial parietal lobes as well as right cerebellum with normal to slightly reduced ADC compatible with acute to early subacute infarcts. There is mild associated T2 hyperintensity/edema associated with the infarcts. A chronic microhemorrhage is noted in the region of the right external capsule. No mass, midline shift, or extra-axial fluid collection is identified. The ventricles and sulci are within normal limits for age. The pituitary gland is borderline prominent in height without a discrete sellar mass identified on this nondedicated study. Vascular: Major intracranial vascular flow voids are preserved. Skull and upper cervical spine: Unremarkable bone marrow signal. Sinuses/Orbits: Unremarkable orbits. Large bilateral  mastoid effusions. Mild left frontal sinus mucosal thickening. Other: None. IMPRESSION: 1. Small acute to early subacute infarcts involving bilateral cerebral cortex and right  cerebellum. 2. Large bilateral mastoid effusions. Electronically Signed   By: Logan Bores M.D.   On: 08/26/2019 19:33   Ir Fluoro Guide Cv Line Left  Result Date: 09/02/2019 CLINICAL DATA:  Renal failure, needs durable venous access for hemodialysis. Currently using temporary dialysis catheter in the left IJ. History of right IJ occlusion. EXAM: TUNNELED HEMODIALYSIS CATHETER PLACEMENT WITH ULTRASOUND AND FLUOROSCOPIC GUIDANCE TECHNIQUE: The procedure, risks, benefits, and alternatives were explained to the family. Questions regarding the procedure were encouraged and answered. The family understands and consents to the procedure. Patient was receiving adequate prophylactic antibiotic coverage already. Patency of the left IJ vein was confirmed with ultrasound with image documentation. An appropriate skin site was determined. Region was prepped using maximum barrier technique including cap and mask, sterile gown, sterile gloves, large sterile sheet, and Chlorhexidine as cutaneous antisepsis. The region was infiltrated locally with 1% lidocaine. Intravenous Fentanyl 33mg and Versed 1.562mwere administered as conscious sedation during continuous monitoring of the patient's level of consciousness and physiological / cardiorespiratory status by the radiology RN, with a total moderate sedation time of 22 minutes. Under real-time ultrasound guidance, the left IJ vein was accessed with a 21 gauge micropuncture needle; the needle tip within the vein was confirmed with ultrasound image documentation. The 018 wire would not advance centrally. Needle exchanged over the 018 guidewire for transitional dilator, which allowed advancement of a stiff angled glidewire, negotiated to the SVC. The transitional dilator was exchanged for a 5 French angiographic catheter, advanced into the IVC. A Palindrome 23 hemodialysis catheter was tunneled from the left anterior chest wall approach to the left IJ dermatotomy site. The MPA  catheter was exchanged over an Amplatz wire for serial vascular dilators which allow placement of a peel-away sheath, through which the catheter was advanced under intermittent fluoroscopy, positioned with its tips in the proximal right atrium. Spot chest radiograph confirms good catheter position. No pneumothorax. Catheter was flushed and primed per protocol. Catheter secured externally with O Prolene sutures. The left IJ dermatotomy site was closed with Dermabond. COMPLICATIONS: COMPLICATIONS None immediate FLUOROSCOPY TIME:  3 minutes 24 seconds; 12 mGy COMPARISON:  None IMPRESSION: 1. Technically successful placement of tunneled left IJ hemodialysis catheter with ultrasound and fluoroscopic guidance. Ready for routine use. ACCESS: Remains approachable for percutaneous intervention as needed. Electronically Signed   By: D Lucrezia Europe.D.   On: 09/02/2019 11:48   Ir UsKoreauide Vasc Access Left  Result Date: 09/02/2019 CLINICAL DATA:  Renal failure, needs durable venous access for hemodialysis. Currently using temporary dialysis catheter in the left IJ. History of right IJ occlusion. EXAM: TUNNELED HEMODIALYSIS CATHETER PLACEMENT WITH ULTRASOUND AND FLUOROSCOPIC GUIDANCE TECHNIQUE: The procedure, risks, benefits, and alternatives were explained to the family. Questions regarding the procedure were encouraged and answered. The family understands and consents to the procedure. Patient was receiving adequate prophylactic antibiotic coverage already. Patency of the left IJ vein was confirmed with ultrasound with image documentation. An appropriate skin site was determined. Region was prepped using maximum barrier technique including cap and mask, sterile gown, sterile gloves, large sterile sheet, and Chlorhexidine as cutaneous antisepsis. The region was infiltrated locally with 1% lidocaine. Intravenous Fentanyl 7557mand Versed 1.5mg80mre administered as conscious sedation during continuous monitoring of the  patient's level of consciousness and physiological / cardiorespiratory status by the radiology RN, with  a total moderate sedation time of 22 minutes. Under real-time ultrasound guidance, the left IJ vein was accessed with a 21 gauge micropuncture needle; the needle tip within the vein was confirmed with ultrasound image documentation. The 018 wire would not advance centrally. Needle exchanged over the 018 guidewire for transitional dilator, which allowed advancement of a stiff angled glidewire, negotiated to the SVC. The transitional dilator was exchanged for a 5 French angiographic catheter, advanced into the IVC. A Palindrome 23 hemodialysis catheter was tunneled from the left anterior chest wall approach to the left IJ dermatotomy site. The MPA catheter was exchanged over an Amplatz wire for serial vascular dilators which allow placement of a peel-away sheath, through which the catheter was advanced under intermittent fluoroscopy, positioned with its tips in the proximal right atrium. Spot chest radiograph confirms good catheter position. No pneumothorax. Catheter was flushed and primed per protocol. Catheter secured externally with O Prolene sutures. The left IJ dermatotomy site was closed with Dermabond. COMPLICATIONS: COMPLICATIONS None immediate FLUOROSCOPY TIME:  3 minutes 24 seconds; 12 mGy COMPARISON:  None IMPRESSION: 1. Technically successful placement of tunneled left IJ hemodialysis catheter with ultrasound and fluoroscopic guidance. Ready for routine use. ACCESS: Remains approachable for percutaneous intervention as needed. Electronically Signed   By: Lucrezia Europe M.D.   On: 09/02/2019 11:48   Dg Chest 1v Repeat Same Day  Result Date: 09/08/2019 CLINICAL DATA:  Status post chest tube removal. History of right-sided pneumothorax. EXAM: CHEST - 1 VIEW SAME DAY COMPARISON:  One-view chest x-ray 09/08/2019 at 4:20 a.m. FINDINGS: The right-sided chest tube is been removed. Tiny lateral pneumothorax is  stable. Tracheostomy tube is in place. Left IJ line is stable. Feeding tube courses off the inferior of the film. Mild pulmonary vascular congestion is present. Bilateral effusions are present. Bibasilar airspace disease is stable. IMPRESSION: 1. Stable tiny right lateral pneumothorax. 2. Stable bibasilar airspace disease and effusions. 3. Stable mild pulmonary vascular congestion. Electronically Signed   By: San Morelle M.D.   On: 09/08/2019 14:26   Dg Chest Port 1 View  Result Date: 09/09/2019 CLINICAL DATA:  Shortness of breath. Pneumothorax on right. Encounter for chest tube removal. EXAM: PORTABLE CHEST 1 VIEW COMPARISON:  Radiograph yesterday. FINDINGS: Tracheostomy tube tip at the thoracic inlet. Weighted enteric tube tip below the diaphragm not included in the field of view. Left internal jugular catheter tip in the SVC. Unchanged small right basal lateral pneumothorax. Right lower lobe pulmonary pneumatocele again seen. Unchanged heart size and mediastinal contours. Slight worsening and mild diffuse pulmonary opacity, atelectasis versus edema. IMPRESSION: 1. Unchanged small right basal lateral pneumothorax. 2. Slight worsening diffuse pulmonary opacity, atelectasis versus edema. 3. Tracheostomy tube tip at the thoracic inlet. Electronically Signed   By: Keith Rake M.D.   On: 09/09/2019 06:26   Dg Chest Port 1 View  Result Date: 09/08/2019 CLINICAL DATA:  Pneumothorax. EXAM: PORTABLE CHEST 1 VIEW COMPARISON:  Radiograph 09/06/2019. Chest CT 09/01/2019 FINDINGS: Tracheostomy tube at the thoracic inlet. Weighted enteric tube tip below the field of view. Left-sided dialysis catheter tip in the mid SVC. Right chest tube remains in place with small right basilar pneumothorax. Unchanged pneumatocele in the right lung base. Slightly lower lung volumes from prior exam with slightly worsening aeration in the left lung and right upper lung zone. Cardiomegaly with unchanged mediastinal  contours. IMPRESSION: 1. Unchanged small right basilar pneumothorax with chest tube in place. Right lung base pneumatocele again seen. 2. Slightly lower lung volumes from  prior exam with slightly worsening aeration in the left lung and right upper lung zone and increasing diffuse opacity, pulmonary edema versus airspace disease/atelectasis. 3. Unchanged support apparatus. Electronically Signed   By: Keith Rake M.D.   On: 09/08/2019 04:44   Dg Chest Port 1 View  Result Date: 09/06/2019 CLINICAL DATA:  Evaluate pneumothorax. EXAM: PORTABLE CHEST 1 VIEW COMPARISON:  09/05/2019 FINDINGS: Tracheostomy tube tip is above the carina. There is a left IJ catheter with tip in the projection of the SVC. Feeding tube in place. Right chest tube is in place. Small right lateral basilar pneumothorax is unchanged. Large pneumatocele in the right lung base is again noted measuring approximately 10.5 cm. Unchanged. Bilateral interstitial and airspace opacities are stable. IMPRESSION: 1. Right-sided chest tube in place with small right lateral basilar pneumothorax. Unchanged 2. No change in aeration a lungs compared with previous exam. Electronically Signed   By: Kerby Moors M.D.   On: 09/06/2019 04:19   Dg Chest Port 1 View  Result Date: 09/05/2019 CLINICAL DATA:  Right pneumothorax. EXAM: PORTABLE CHEST 1 VIEW COMPARISON:  09/04/2019.  09/03/2019.  CT 09/01/2019. FINDINGS: Tracheostomy tube, feeding tube, left IJ dialysis catheter in stable position. Right chest tube in stable position. Stable tiny right base pneumothorax again noted. No interim change. Patchy bilateral pulmonary infiltrates again noted without interim change. Changes of pleural-parenchymal scarring again noted. Heart size stable. IMPRESSION: 1. Lines and tubes including right chest tube in stable position. Tiny right base pneumothorax again noted without interim change. 2. Patchy bilateral pulmonary infiltrates are again noted without interval  change. Electronically Signed   By: Marcello Moores  Register   On: 09/05/2019 06:04   Dg Chest Port 1 View  Result Date: 09/04/2019 CLINICAL DATA:  Right pneumothorax. EXAM: PORTABLE CHEST 1 VIEW COMPARISON:  September 03, 2019. FINDINGS: Stable cardiomediastinal silhouette. Tracheostomy and feeding tubes are unchanged in position. Left internal jugular dialysis catheter is unchanged. Stable right-sided chest tube is noted. Stable probable minimal right basilar pneumothorax is noted laterally. Stable bilateral lung opacities are noted. Bony thorax is unremarkable. IMPRESSION: Stable support apparatus. Stable bilateral lung opacities are noted. Stable position of right-sided chest tube with probable minimal right basilar pneumothorax. Electronically Signed   By: Marijo Conception M.D.   On: 09/04/2019 07:25   Dg Chest Port 1 View  Result Date: 09/03/2019 CLINICAL DATA:  Tracheostomy. EXAM: PORTABLE CHEST 1 VIEW COMPARISON:  September 01, 2019. FINDINGS: Tracheostomy tube is in good position. Interval placement of feeding tube which is seen entering stomach. Interval placement of left internal jugular dialysis catheter with distal tip in expected position of cavoatrial junction. Stable bilateral lung opacities are noted. Right-sided chest tube is noted with minimal right basilar pneumothorax. Bony thorax is unremarkable. IMPRESSION: Tracheostomy tube in good position. Interval placement of feeding tube and left internal jugular dialysis catheter. Stable right-sided chest tube is noted with minimal right basilar pneumothorax. Stable bilateral lung opacities are noted. Electronically Signed   By: Marijo Conception M.D.   On: 09/03/2019 10:25   Dg Chest Port 1 View  Result Date: 09/01/2019 CLINICAL DATA:  68 year old male with chest tube placement. COVID-19 positive. EXAM: PORTABLE CHEST 1 VIEW COMPARISON:  Chest radiograph dated 08/31/2019 and chest CT dated 08/13/2019 FINDINGS: Support apparatus in stable position. No  significant interval change in the appearance of the lungs and bilateral confluent densities predominantly involving the peripheral/subpleural regions as well as bibasilar lungs since the prior radiograph. Right lung base cystic or  bolus area appears similar to prior radiograph. No large or detectable pneumothorax. Faint linear density over the right apex appears similar to prior radiograph, likely artifactual and less likely represents pleural reflection. Stable cardiac silhouette. No acute osseous pathology. IMPRESSION: 1. No significant interval change in the appearance of the lungs compared to the prior radiograph. 2. Support apparatus in stable position. Electronically Signed   By: Anner Crete M.D.   On: 09/01/2019 09:55   Dg Chest Port 1 View  Result Date: 08/31/2019 CLINICAL DATA:  COVID-19 positive.  Tracheostomy tube. EXAM: PORTABLE CHEST 1 VIEW COMPARISON:  08/30/2019 FINDINGS: Tracheostomy tube in adequate position. Enteric tube courses into the stomach and off the film as tip is not visualized. Right medial basilar chest tube unchanged. Left IJ central venous catheter horizontally oriented over the SVC unchanged. Lungs are hypoinflated with persistent tiny right-sided pneumothorax. Stable opacification over the right midlung and right base. Stable hazy opacification over the left base/retrocardiac region. Remainder of the exam is unchanged. IMPRESSION: 1. Stable opacification over the right midlung and bibasilar regions. 2.  Stable tiny right-sided pneumothorax. 3.  Tubes and lines as described. Electronically Signed   By: Marin Olp M.D.   On: 08/31/2019 07:15   Dg Chest Port 1 View  Result Date: 08/30/2019 CLINICAL DATA:  Pneumothorax on right EXAM: PORTABLE CHEST 1 VIEW COMPARISON:  Chest radiograph 08/26/2019, 08/23/2019 FINDINGS: Stable cardiomediastinal contours. Unchanged for apparatus. Persistent small right basilar pneumothorax with chest tube in place. Persistent  infiltrates/atelectasis in the right mid upper lung and left base. No large pleural effusion. No acute finding in the visualized skeleton. IMPRESSION: Unchanged chest radiograph with persistent small right basilar pneumothorax and atelectasis/infiltrates in the right mid to upper lung as well as left base. Electronically Signed   By: Audie Pinto M.D.   On: 08/30/2019 09:42   Dg Chest Port 1 View  Result Date: 08/26/2019 CLINICAL DATA:  Pneumothorax. EXAM: PORTABLE CHEST 1 VIEW COMPARISON:  08/23/2019. FINDINGS: Tracheostomy tube, left IJ line, feeding tube, right chest tube in stable position. Stable right base pneumothorax. Stable atelectatic changes/infiltrate right upper lung and left base. Heart size stable. IMPRESSION: 1. Lines and tubes including right chest tube in stable position. Stable right base pneumothorax. 2. Stable right upper lung and left lower atelectatic changes/infiltrates. Chest is unchanged from prior exam. Electronically Signed   By: Marcello Moores  Register   On: 08/26/2019 10:36   Dg Chest Port 1 View  Result Date: 08/23/2019 CLINICAL DATA:  COVID-19 positive, endotracheal tube in place. EXAM: PORTABLE CHEST 1 VIEW COMPARISON:  Chest x-rays dated 08/22/2019. FINDINGS: Tubes and lines are stable in position, including a RIGHT-sided chest tube. Prominent lucency again noted at the RIGHT lung base, stable in the short-term interval, compatible with the air that was associated with a complicated pleural effusion as demonstrated on chest CT of 08/13/2019, at that time suspected to represent sequela of manipulation of a bronchial occlusion tube at bronchoscopy. Patchy airspace opacities are again seen bilaterally, stable, presumed pneumonia. No new lung findings. IMPRESSION: 1. Stable chest x-ray. Tubes and lines are stable in position, including the RIGHT-sided chest tube with tip positioned at the medial aspects of the RIGHT lung base 2. Patchy airspace opacities bilaterally, stable,  presumed bilateral pneumonia. 3. Stable appearance of the collection of air at the RIGHT lung base, as described above. 4. No new lung findings. Electronically Signed   By: Franki Cabot M.D.   On: 08/23/2019 09:04   Dg Chest [facility]  1 View  Result Date: 08/22/2019 CLINICAL DATA:  Status post tracheostomy. COVID-19 viral pneumonia. EXAM: PORTABLE CHEST 1 VIEW COMPARISON:  08/22/2019 FINDINGS: A new tracheostomy tube is seen in appropriate position. Feeding tube and bilateral central venous catheters remain in stable position. Tip of the right subclavian central venous catheter seen overlying the inferior right atrium. Heart size is within normal limits allowing for low lung volumes. Right chest tube remains in place. No pneumothorax visualized. Bullous disease again seen in right lung base. Increased atelectasis or airspace disease is seen in the retrocardiac left lung base. Mild bilateral peripheral pulmonary airspace disease shows no significant change. IMPRESSION: New tracheostomy tube in appropriate position. Right subclavian [facility] venous catheter tip overlies the lower right atrium. Increased atelectasis or airspace disease in retrocardiac left lung base. Electronically Signed   By: Marlaine Hind M.D.   On: 08/22/2019 16:49   Dg Chest Port 1 View  Result Date: 08/22/2019 CLINICAL DATA:  Intubated. EXAM: PORTABLE CHEST 1 VIEW COMPARISON:  08/21/2019 FINDINGS: Endotracheal tube in satisfactory position. Feeding tube extending into the distal stomach with its tip not included. Right subclavian catheter tip in the region of the superior cavoatrial junction or upper right atrium. No significant change in bilateral interstitial and patchy opacities and right basilar bullous changes. No definite pneumothorax. Minimal right pleural fluid. Normal sized heart. Thoracic spine degenerative changes. IMPRESSION: 1. No significant change in bilateral pneumonia and interstitial disease and right basilar bullous changes.  2. Minimal right pleural fluid. Electronically Signed   By: Claudie Revering M.D.   On: 08/22/2019 07:12   Dg Chest Port 1 View  Result Date: 08/21/2019 CLINICAL DATA:  COVID-19, intubation EXAM: PORTABLE CHEST 1 VIEW COMPARISON:  Portable exam 0535 hours compared to 08/20/2019 FINDINGS: Tip of endotracheal tube projects 3.4 cm above carina. Feeding tube extends into stomach. LEFT jugular dual-lumen central venous catheter tip projects over confluence of the SVC and LEFT brachiocephalic vein. Tip of RIGHT jugular line projects over RIGHT atrium; recommend withdrawal 4 cm to place tip at cavoatrial junction. RIGHT thoracostomy tube unchanged. Stable heart size mediastinal contours. Patchy BILATERAL airspace infiltrates consistent with pneumonia. No pleural effusion. Tiny loculated pneumothorax at lateral RIGHT base. IMPRESSION: Tiny residual RIGHT basilar atelectasis despite thoracostomy tube. Persistent BILATERAL pulmonary infiltrates consistent with pneumonia. Electronically Signed   By: Lavonia Dana M.D.   On: 08/21/2019 08:44   Dg Chest Port 1 View  Result Date: 08/20/2019 CLINICAL DATA:  68 year old male with a history of intubation EXAM: PORTABLE CHEST 1 VIEW COMPARISON:  08/19/2019, 08/18/2019, 08/17/2019 FINDINGS: Cardiomediastinal silhouette unchanged in size and contour. Low lung volumes persist. Unchanged endotracheal tube which terminates 2.4 cm above the carina. Unchanged gastric tube terminating in the stomach. Unchanged left IJ temporary hemodialysis catheter which terminates at the confluence of the left brachiocephalic vein and SVC. Unchanged right subclavian central venous catheter with the tip appearing to terminate superior right atrium. Patchy opacities of the bilateral lungs, unchanged from the prior. No pneumothorax. Blunting of left costophrenic angle with opacity in the retrocardiac region. IMPRESSION: Similar appearance of the lungs with low lung volumes and bilateral patchy opacities,  likely combination of atelectasis/consolidation, developing chronic changes, and possible small left pleural effusion. Unchanged endotracheal tube, left IJ temporary hemodialysis catheter, right subclavian central venous catheter, and gastric tube. Electronically Signed   By: Corrie Mckusick D.O.   On: 08/20/2019 07:50   Dg Chest Port 1 View  Result Date: 08/19/2019 CLINICAL DATA:  Pneumonia and COVID-19.  EXAM: PORTABLE CHEST 1 VIEW COMPARISON:  August 18, 2019. FINDINGS: Stable cardiomediastinal silhouette. Endotracheal and nasogastric tubes are unchanged in position. Left internal jugular and right subclavian catheters are unchanged in position. Stable right-sided chest tube is noted with probable stable loculated right basilar hydropneumothorax. Stable opacities are noted throughout both lungs concerning for pneumonia. Bony thorax is unremarkable. IMPRESSION: Stable support apparatus. Stable right-sided chest tube with probable right basilar hydropneumothorax. Stable bilateral lung opacities. Electronically Signed   By: Marijo Conception M.D.   On: 08/19/2019 07:44   Dg Chest Port 1 View  Result Date: 08/18/2019 CLINICAL DATA:  Follow-up endotracheal tube EXAM: PORTABLE CHEST 1 VIEW COMPARISON:  08/17/2019 FINDINGS: Cardiac shadow is stable. Right subclavian central line is noted in satisfactory position. Endotracheal tube and gastric catheter are again seen and stable. Left jugular dialysis catheter is noted. Patchy opacities are again seen in both lungs. Right chest tube is again noted with loculated hydropneumothorax stable from the prior exam. IMPRESSION: Overall stable appearance of the chest. Electronically Signed   By: Inez Catalina M.D.   On: 08/18/2019 03:09   Dg Chest Port 1 View  Result Date: 08/17/2019 CLINICAL DATA:  Right-sided pneumothorax EXAM: PORTABLE CHEST 1 VIEW COMPARISON:  08/16/2019 FINDINGS: No significant interval change in AP portable examination with right-sided chest tube in  position and a small, persistent, loculated appearing hydropneumothorax about the right lung base. There is redemonstrated, extensive bilateral dense heterogeneous and interstitial airspace opacity, worse on the right. Support apparatus including endotracheal tubes, esophagogastric tube, left neck and right subclavian vascular catheters are unchanged. Cardiomegaly. IMPRESSION: 1. No significant interval change in AP portable examination with right-sided chest tube in position and a small, persistent, loculated appearing hydropneumothorax about the right lung base. 2. There is redemonstrated, extensive bilateral dense heterogeneous and interstitial airspace opacity, worse on the right, consistent with multifocal infection. 3.  Unchanged support apparatus. Electronically Signed   By: Eddie Candle M.D.   On: 08/17/2019 15:54   Dg Chest Port 1 View  Result Date: 08/16/2019 CLINICAL DATA:  68 year old male with positive COVID-19. EXAM: PORTABLE CHEST 1 VIEW COMPARISON:  Earlier radiograph dated 08/16/2019 FINDINGS: Support lines and tubes as seen previously. Diffuse bilateral airspace opacities similar to prior radiograph. Apparent area of lucency at the right costophrenic angle may be artifactual and related to skin fold or represent a small residual right pneumothorax. Stable cardiac silhouette. No acute osseous pathology. IMPRESSION: 1. Artifact versus small residual right pneumothorax at the right costophrenic angle. 2. Diffuse bilateral airspace opacities similar to prior radiograph. 3. Support lines and tubes as seen previously. Electronically Signed   By: Anner Crete M.D.   On: 08/16/2019 21:01   Dg Chest Port 1 View  Result Date: 08/16/2019 CLINICAL DATA:  Hypoxia EXAM: PORTABLE CHEST 1 VIEW COMPARISON:  August 15, 2019 FINDINGS: Endotracheal tube tip is 4.2 cm above the carina. Nasogastric tube tip and side port are below the diaphragm. Right subclavian catheter tip is in the right atrium. Chest  tube noted on the right inferiorly, unchanged in position. Left internal jugular catheter tip is at the junction of the left innominate vein and superior vena cava. There is a catheter with its tip overlying the right lower lobe bronchus medially, stable. No pneumothorax is evident currently. There is persistent airspace consolidation in the right mid and lower lung zones. There is patchy opacity in the left mid and lower lung zones, stable. No new opacity evident. Heart is upper normal in  size with pulmonary vascular normal. No adenopathy. No bone lesions. IMPRESSION: Stable tube and catheter positions. No pneumothorax evident currently. Airspace opacity bilaterally with areas of consolidation on the right, stable. No new opacity evident. Stable cardiac silhouette. Electronically Signed   By: Lowella Grip III M.D.   On: 08/16/2019 09:06   Dg Chest Port 1 View  Result Date: 08/15/2019 CLINICAL DATA:  Orogastric tube placement. EXAM: PORTABLE CHEST 1 VIEW COMPARISON:  Same day. FINDINGS: Stable cardiomediastinal silhouette. Distal tip of enteric tube is seen in proximal stomach. Endotracheal tube is unchanged. Left internal jugular catheter is unchanged. Right subclavian catheter is unchanged. Stable position of right-sided basilar chest tube is noted without pneumothorax. Stable bilateral lung opacities are noted. Stable catheter seen entering right mainstem bronchus compared to prior exam. Bony thorax is unremarkable. IMPRESSION: Feeding tube has been removed. There has been interval placement of what appears to be nasogastric tube with tip in expected position of proximal stomach. Stable bilateral lung opacities and other support apparatus are noted. Electronically Signed   By: Marijo Conception M.D.   On: 08/15/2019 14:55   Dg Chest Port 1 View  Result Date: 08/15/2019 CLINICAL DATA:  Chest tube, shortness of breath EXAM: PORTABLE CHEST 1 VIEW COMPARISON:  08/14/2019 FINDINGS: Support devices  including right basilar chest tube remain in place, unchanged. No definite pneumothorax currently. Patchy bilateral airspace opacities are again noted, unchanged. Heart is borderline in size. IMPRESSION: Patchy bilateral airspace disease again noted, unchanged. No definite visible pneumothorax. Electronically Signed   By: Rolm Baptise M.D.   On: 08/15/2019 07:58   Dg Chest Port 1 View  Result Date: 08/14/2019 CLINICAL DATA:  Pneumothorax, chest tube EXAM: PORTABLE CHEST 1 VIEW COMPARISON:  08/14/2019 FINDINGS: Right chest tube and remainder support devices are stable. No pneumothorax. Patchy bilateral airspace opacities, right greater than left again noted, stable. Mild cardiomegaly. No visible effusions. IMPRESSION: No visible pneumothorax.  No change since prior study Electronically Signed   By: Rolm Baptise M.D.   On: 08/14/2019 11:46   Dg Chest Port 1 View  Result Date: 08/14/2019 CLINICAL DATA:  Acute respiratory failure with hypoxemia. EXAM: PORTABLE CHEST 1 VIEW COMPARISON:  August 13, 2019. FINDINGS: Stable cardiomediastinal silhouette. Endotracheal and feeding tubes are unchanged in position. Stable left internal jugular and right subclavian catheters are noted. Stable catheter is seen extending into right mainstem bronchus. Stable bilateral lung opacities are noted. Stable right-sided chest tube without pneumothorax. Small bilateral pleural effusions may be present. Bony thorax is unremarkable. IMPRESSION: Grossly stable support apparatus. Stable bilateral lung opacities as described above. Electronically Signed   By: Marijo Conception M.D.   On: 08/14/2019 07:43   Dg Chest Port 1 View  Result Date: 08/13/2019 CLINICAL DATA:  Endotracheal to EXAM: PORTABLE CHEST 1 VIEW COMPARISON:  [id number] hours FINDINGS: Endotracheal tube and 2 central venous line unchanged. NG tube exchanged for feeding tube. A catheter extends through the lumen of the endotracheal tube into the RIGHT mainstem bronchus.  There is hydropneumothorax at the RIGHT lung base again noted. There is diffuse patchy airspace disease which is slightly worse on the RIGHT and similar on the LEFT. IMPRESSION: 1. Stable support apparatus. 2. No change in subpulmonic RIGHT hydropneumothorax. 3. No change in patchy airspace disease in the RIGHT upper lobe. 4. Slight worsening airspace disease in the LEFT lung. Electronically Signed   By: Suzy Bouchard M.D.   On: 08/13/2019 17:39   Dg Chest [facility]  Result Date: 08/13/2019 CLINICAL DATA:  Right pneumothorax EXAM: PORTABLE CHEST 1 VIEW COMPARISON:  08/13/2019 FINDINGS: Support devices including right chest tube remain in place, unchanged. Lucency again noted over the right lung base which could reflect loculated right pneumothorax. Airspace disease throughout the lungs bilaterally, right greater than left with improvement in aeration in the left lung since prior study. Heart is normal size. IMPRESSION: Continued lucency over the right lung base could reflect loculated right pneumothorax. Bilateral airspace disease, right greater than left. Improved airspace disease on the left since prior study. Electronically Signed   By: Rolm Baptise M.D.   On: 08/13/2019 11:34   Dg Chest Port 1 View  Result Date: 08/13/2019 CLINICAL DATA:  Respiratory failure EXAM: PORTABLE CHEST 1 VIEW COMPARISON:  Yesterday FINDINGS: Endotracheal tube tip is at the clavicular heads. Right subclavian central line with tip at the upper cavoatrial junction. Endobronchial blocker on the right in stable position over the right hilum. The orogastric tube reaches the stomach. Right base chest tube which crosses the midline, presumably anterior and accentuated by leftward rotation. Basal pneumothorax on the right is unchanged. Unchanged bilateral airspace disease. IMPRESSION: 1. Unchanged hardware positioning including endobronchial blocker tip overlapping the right hilum. 2. Unchanged bilateral airspace disease with  right base lucency more likely loculated pneumothorax rather than cavitary pneumonia Electronically Signed   By: Monte Fantasia M.D.   On: 08/13/2019 09:04   Dg Chest Port 1 View  Result Date: 08/12/2019 CLINICAL DATA:  Respiratory failure. EXAM: PORTABLE CHEST 1 VIEW COMPARISON:  Chest x-ray 08/10/2019. FINDINGS: Endotracheal tube, dual-lumen left IJ line, right PICC line in stable position. Right lower lobe bronchus balloon occlusion catheter stable position. Right chest tube in stable position. Small right base pneumothorax cannot be excluded on today's exam. Bibasilar atelectasis/infiltrates again noted. No interim change. Tiny bilateral pleural effusions. IMPRESSION: 1. Endotracheal tube, dual-lumen left IJ line, right PICC line stable position. Right lower lobe bronchus balloon occlusion catheter in stable position. Right chest tube in stable position. 2. Small right base pneumothorax cannot be excluded on today's exam. 3. Bibasilar atelectasis/infiltrates again noted without interim change. Critical Value/emergent results were called by telephone at the time of interpretation on 08/12/2019 at 7:31 am to the patient's nurse, who verbally acknowledged these results. Electronically Signed   By: Marcello Moores  Register   On: 08/12/2019 07:33   Dg Chest Port 1 View  Result Date: 08/10/2019 CLINICAL DATA:  COVID positive EXAM: PORTABLE CHEST 1 VIEW COMPARISON:  08/09/2019 FINDINGS: Left lung apex and lateral costophrenic angle are partially visualized. Endotracheal tube terminates 5 cm above the carina. Right chest tube.  No pneumothorax is seen. Left IJ dual lumen catheter terminates in the mid SVC. Right subclavian venous catheter terminates at the cavoatrial junction. Enteric tube terminates in the proximal stomach. Stable selective balloon occlusion catheter in the right lower lobe bronchus. Multifocal patchy opacities, lower lung predominant, grossly unchanged. No definite pleural effusions. The heart is  normal in size. IMPRESSION: Multifocal patchy opacities, grossly unchanged, compatible with pneumonia in this patient with known COVID. Stable right chest tube.  No pneumothorax is seen. Endotracheal tube terminates 5 cm above the carina. Additional stable support apparatus as above. Electronically Signed   By: Julian Hy M.D.   On: 08/10/2019 16:59   Dg Abd Portable 1v  Result Date: 08/30/2019 CLINICAL DATA:  NG tube placement EXAM: PORTABLE ABDOMEN - 1 VIEW COMPARISON:  08/16/2019 FINDINGS: There is a nasogastric tube with the tip projecting over the  stomach. There is no bowel dilatation to suggest obstruction. There is no evidence of pneumoperitoneum, portal venous gas or pneumatosis. There are no pathologic calcifications along the expected course of the ureters. The osseous structures are unremarkable. IMPRESSION: Nasogastric tube with the tip projecting over the stomach. Electronically Signed   By: Kathreen Devoid   On: 08/30/2019 15:56   Dg Abd Portable 1v  Result Date: 08/16/2019 CLINICAL DATA:  OG tube placement EXAM: PORTABLE ABDOMEN - 1 VIEW COMPARISON:  08/13/2019 FINDINGS: Nasogastric tube with the tip projecting over the stomach. There is no bowel dilatation to suggest obstruction. There is no evidence of pneumoperitoneum, portal venous gas or pneumatosis. There are no pathologic calcifications along the expected course of the ureters. The osseous structures are unremarkable. IMPRESSION: Nasogastric tube with the tip projecting over the stomach. Electronically Signed   By: Kathreen Devoid   On: 08/16/2019 12:09   Dg Abd Portable 1v  Result Date: 08/13/2019 CLINICAL DATA:  ETT advancement, Feeding tube placement EXAM: PORTABLE ABDOMEN - 1 VIEW COMPARISON:  Radiograph 08/10/2019 FINDINGS: NG tube has been replaced with a feeding tube. The tip of the feeding tube is within the second portion the duodenum. Gas in the stomach. No dilated loops of large or small bowel. Bibasilar effusions and  atelectasis. IMPRESSION: 1. Feeding tube with tip in the second portion the duodenum. 2. No evidence of bowel obstruction. 3. Bibasilar effusions and atelectasis. Electronically Signed   By: Suzy Bouchard M.D.   On: 08/13/2019 17:30     Time Spent in minutes  30     Desiree Hane M.D on 09/09/2019 at 8:34 AM  To page go to [email or web address] - password [email or web address]

## 2019-09-09 NOTE — Progress Notes (Signed)
      [address]       [place] [address]             [phone]      Asleep presently  BP 139/83   Pulse 90   Temp 98.8 F (37.1 C) (Oral)   Resp (!) 22   Ht 5\' 10"  (1.778 m)   Wt 87.3 kg   SpO2 98%   BMI 27.62 kg/m   PORTABLE CHEST 1 VIEW  COMPARISON:  Radiograph yesterday.  FINDINGS: Tracheostomy tube tip at the thoracic inlet. Weighted enteric tube tip below the diaphragm not included in the field of view. Left internal jugular catheter tip in the SVC. Unchanged small right basal lateral pneumothorax. Right lower lobe pulmonary pneumatocele again seen. Unchanged heart size and mediastinal contours. Slight worsening and mild diffuse pulmonary opacity, atelectasis versus edema.  IMPRESSION: 1. Unchanged small right basal lateral pneumothorax. 2. Slight worsening diffuse pulmonary opacity, atelectasis versus edema. 3. Tracheostomy tube tip at the thoracic inlet.   Electronically Signed   By: Keith Rake M.D.   On: 09/09/2019 06:26  Chest xray with unchanged small lateral basilar space after CT removal  Plan per Medical service  Will sign off. Please call if I can be of any assistance  [name] C. Roxan Hockey, MD [facility] [phone]

## 2019-09-09 NOTE — Progress Notes (Signed)
[facility]  PROGRESS NOTE  [name] LPF:[id number] DOB: Nov 02, 1950 DOA: 07/25/2019 PCP: Kathyrn Drown, MD Admit date - 07/25/2019   Admitting Physician Kristopher Oppenheim, DO  Outpatient Primary MD for the patient is Kathyrn Drown, MD  LOS - 63 Brief Narrative   [name] is a 68 y.o. year old male with medical history significant for GERD and BPH who initially presented on 10/2//20 with worsening SOB x 5 days after recently testing positive for COVID-19 (07/22/19, in Epic). EMS found him to be tachypneic with profound hypoxia to 40s  requiring nonrebreather, upon arrival to [facility] ED he required emergent intubation. CXR and CTA chest showed multifocal consolidation, and ground glass opacity, negative for PE. Marland Kitchen Patient was admitted to [facility] for Acute hypoxic respiratory failure secondary to severe ARDS from COVID pneumonia and started on decadron, remdesevir and toclilizumab.  Prolonged Hospital course complicated by [id number] pneumonia, ARDS, AKI requiring CRRT, encephalopathy, pneumothorax complicated by persistent air leak and bronchopleural fistula requiring endobronchial blocker and Pleur-evac, prolonged mechanical ventilation status post tracheostomy on 10/30, MSSA bacteremia with negative echocardiogram, intermittent pressor support required with dialysis, melena with acute blood loss anemia requiring transfusion, IV protonix, empiric octreotide(previous history of significant ethanol use) and EGD with gastric ulcer and nonbleeding visible vessel clipped, transferred to [facility] on 11/3 for intermittent HD, subacute right cerebral/cerebellar infarcts on MRI treated with aspirin on 11/4.  Subjective  Patient seen and examined in ICU.  He was initially sleepy but woke up without an effort.  Unable to talk but can communicate through gestures.  Denied any complaint. A & P    Acute hypoxic respiratory failure with severe ARDS secondary to Covid pneumonia, stable.  Completed  remdesivir/Decadron/actemra/convalescent plasma therapy.  ARDS resolved. Covid test negative x2 (11/1, 11/2).  Stable on trach collar per PCCM.  S/p chest tube due to pneumothorax which was removed on 09/08/2019.  CTS signed off today. -serial CXR per CTS   AKI on CKD Stage 3( baseline cr around 1.5),likely secondary to ATN related to septic shock. requiring HD TTS in hospital.  Has tolerated HD without pressors.  - HD per nephrology via temporary dialysis catheter - need to start thinking about permanent access  Septic shock, resolved,   No longer requiring as needed pressors during dialysis,  Right pneumothorax/empyema/necrotic right lower lobe/bronchopleural fistula with large air leak, stable.  Chest tube removed by CT surgery on 11/16  - closely monitor, serial cxr   Acute on chronic anemia secondary to gastric ulcer status post EGD with culprit vessel clipping.  transfusion of 1 unit on 11/13.  Hemoglobin is stable ever since.  Watch daily. -Continue PPI twice daily.  Daily CBC   H. Pylori positive by EGD biopsy.  -GI recommends waiting unitl clinically stable after discharge and starting 2 week regimen to prevent ulcer recurrence. GI (per Dr. Cristina Gong 10/27) to follow up in 2-3 months from hospitalization to arrange this and possible f/u EGD.   Small bilateral cerebral/right cerebellum infarcts(11/3) likely due to hypotension secondary to dialysis and sepsis and deconditioning. PAF is also a potential etiology.  Mental status has improved significantly over last 48 hours, has left hemiparesis with improved strength on left and able to speak with use of PMV.  -  Neurology recommends aspirin 325 mg, permissive hypertension (goal BP 120-140),  -no anticoagulation given history of GI bleed.  -F/u 4 wks after discharge at stroke clinic NP at [facility]. -working with PT likely LTAC vs SNF  Anoxic brain injury. Per neurology bilateral fronal gyrus ribboningon MRI (11/3)consistent with cortical  ischemia consistent with anoxic brain injury. Has had frequent episodes of hypotension.  -Goal Bp 120-140  Atrial fibrillation, currently rate controlled in sinus rhythm. unable to fully anticoagulate due to recent GI bleeding,  Briefly required amiodarone earlier in hospitalization.  -Monitor on telemetry. -continue aspirin  Sinus tachycardia, resolved. Likely related to previously low hgb.   MSSA bacteremia,, TTE negative but still high concern for infective endocarditis  -continue cefazolin, stop date 11/23 (6 total weeks)  Pseudomonas and Acinetobacter in BAL specimen, resolved.  Completed 10-day treatment of meropenem  Elevated D-dimer. > 20 on 10/19, down to 8/83 on 10/23 no full anticoagulation with prior GI bleed. Dopplers negative for DVT on 10/8. Likely related to right lung empyema.   Pressure injuries, not present on admission.  Pressure Injury 08/24/19 Sacrum Right;Left;Mid Stage II -  Partial thickness loss of dermis presenting as a shallow open ulcer with a red, pink wound bed without slough.  (Active)  08/24/19 0800  Location: Sacrum  Location Orientation: Right;Left;Mid  Staging: Stage II -  Partial thickness loss of dermis presenting as a shallow open ulcer with a red, pink wound bed without slough.  Wound Description (Comments): -- (stage 11 mid sacrum)  Present on Admission: No    Goals of care,guarded prognosis.  Patient family currently elected to continue full scope of medical treatment, appreciate palliative care team assistance.     Family Communication  : No family present at bedside.  Code Status : DNR  Disposition Plan  : Guarded prognosis, continue medical management, doing much better mental status greatly improved, respiratory status stable but still needs close monitoring with chest tube out.   Consults  : ID, nephrology, GI, neurology PCCM, Palliative  Procedures  :  Chest tube removed, 11/16 Tunneled HD cath, 11/10 HD, 11/3 Tracheostomy  10/30 EGD 10/17. Gastric ulcer, nonbleeding visible vessel clipped CRRT 10/15 Bronchoscopy with endobronchial ballon, 10/15, repeat 10/16 Right-sided chest tube, 10/14 Right pigtail chest tube--for right PTX 10/13-2/14 Right IJ 10/2-10/10 ETT 10/2        COVID-19 specific Treatment:  Decadron 10/2 > 10/12  Remdesivir 10/2 > 10/6  Actemra 10/2  Convalescent plasma 10/3   DVT Prophylaxis  : SCDs  Lab Results  Component Value Date   PLT 264 09/08/2019    Diet :  Diet Order            Diet NPO time specified  Diet effective midnight               Inpatient Medications Scheduled Meds:  aspirin  325 mg Per Tube Daily   atorvastatin  40 mg Oral q1800   B-complex with vitamin C  1 tablet Per Tube Daily   chlorhexidine  15 mL Mouth/Throat BID   Chlorhexidine Gluconate Cloth  6 each Topical Daily   Chlorhexidine Gluconate Cloth  6 each Topical Q0600   darbepoetin (ARANESP) injection - NON-DIALYSIS  100 mcg Subcutaneous Q Wed-1800   feeding supplement (PRO-STAT SUGAR FREE 64)  30 mL Per Tube QID   Gerhardt's butt cream   Topical TID   heparin injection (subcutaneous)  5,000 Units Subcutaneous Q8H   mouth rinse  15 mL Mouth Rinse 10 times per day   pantoprazole sodium  40 mg Per Tube Daily   sucralfate  1 g Oral Q6H   Continuous Infusions:  sodium chloride Stopped (09/01/19 0000)    ceFAZolin (ANCEF) IV Stopped (  09/08/19 2237)   dextrose Stopped (08/16/19 1623)   feeding supplement (NEPRO CARB STEADY) 45 mL/hr at 09/09/19 0900   PRN Meds:.sodium chloride, acetaminophen (TYLENOL) oral liquid 160 mg/5 mL, dextrose, metoprolol tartrate, pneumococcal 23 valent vaccine, polyethylene glycol, polyvinyl alcohol  Antibiotics  :   Anti-infectives (From admission, onward)   Start     Dose/Rate Route Frequency Ordered Stop   08/26/19 2200  ceFAZolin (ANCEF) IVPB 1 g/50 mL premix     1 g 100 mL/hr over 30 Minutes Intravenous Every 24 hours 08/24/19 1021 09/15/19  2359   08/24/19 1100  meropenem (MERREM) 1 g in sodium chloride 0.9 % 100 mL IVPB     1 g 200 mL/hr over 30 Minutes Intravenous Every 24 hours 08/24/19 1021 08/25/19 1156   08/23/19 1100  ceFAZolin (ANCEF) IVPB 2g/100 mL premix  Status:  Discontinued     2 g 200 mL/hr over 30 Minutes Intravenous Every 12 hours 08/23/19 1048 08/24/19 1021   08/21/19 1700  vancomycin (VANCOCIN) IVPB 750 mg/150 ml premix  Status:  Discontinued     750 mg 150 mL/hr over 60 Minutes Intravenous Every 24 hours 08/21/19 0843 08/23/19 1048   08/21/19 1400  vancomycin (VANCOCIN) IVPB 750 mg/150 ml premix  Status:  Discontinued     750 mg 150 mL/hr over 60 Minutes Intravenous Every 24 hours 08/20/19 1146 08/20/19 1218   08/21/19 1000  meropenem (MERREM) 1 g in sodium chloride 0.9 % 100 mL IVPB  Status:  Discontinued     1 g 200 mL/hr over 30 Minutes Intravenous Every 12 hours 08/20/19 1535 08/23/19 1048   08/21/19 0413  meropenem (MERREM) 500 mg in sodium chloride 0.9 % 100 mL IVPB  Status:  Discontinued     500 mg 200 mL/hr over 30 Minutes Intravenous Every 24 hours 08/20/19 1245 08/20/19 1535   08/20/19 1330  vancomycin (VANCOCIN) 1,750 mg in sodium chloride 0.9 % 500 mL IVPB     1,750 mg 250 mL/hr over 120 Minutes Intravenous  Once 08/20/19 1222 08/20/19 1619   08/20/19 1300  vancomycin (VANCOCIN) 1,250 mg in sodium chloride 0.9 % 250 mL IVPB  Status:  Discontinued     1,250 mg 166.7 mL/hr over 90 Minutes Intravenous  Once 08/20/19 1146 08/20/19 1218   08/20/19 1224  vancomycin variable dose per unstable renal function (pharmacist dosing)  Status:  Discontinued      Does not apply See admin instructions 08/20/19 1225 08/21/19 1152   08/15/19 1600  meropenem (MERREM) 1 g in sodium chloride 0.9 % 100 mL IVPB  Status:  Discontinued     1 g 200 mL/hr over 30 Minutes Intravenous Every 12 hours 08/15/19 1543 08/20/19 1245   08/15/19 1000  voriconazole (VFEND) tablet 200 mg  Status:  Discontinued     200 mg Per Tube  Every 12 hours 08/14/19 1148 08/15/19 1134   08/14/19 1600  anidulafungin (ERAXIS) 100 mg in sodium chloride 0.9 % 100 mL IVPB  Status:  Discontinued     100 mg 78 mL/hr over 100 Minutes Intravenous Every 24 hours 08/13/19 1645 08/14/19 1148   08/14/19 1300  voriconazole (VFEND) tablet 400 mg     400 mg Per Tube Every 12 hours 08/14/19 1148 08/14/19 2217   08/13/19 1700  anidulafungin (ERAXIS) 200 mg in sodium chloride 0.9 % 200 mL IVPB     200 mg 78 mL/hr over 200 Minutes Intravenous  Once 08/13/19 1645 08/13/19 2030   08/13/19 0200  ceFAZolin (  ANCEF) IVPB 2g/100 mL premix  Status:  Discontinued     2 g 200 mL/hr over 30 Minutes Intravenous Every 12 hours 08/11/19 1512 08/15/19 1543   08/12/19 0200  ceFEPIme (MAXIPIME) 2 g in sodium chloride 0.9 % 100 mL IVPB     2 g 200 mL/hr over 30 Minutes Intravenous Every 12 hours 08/11/19 1509 08/12/19 1341   08/07/19 2200  ceFEPIme (MAXIPIME) 2 g in sodium chloride 0.9 % 100 mL IVPB  Status:  Discontinued     2 g 200 mL/hr over 30 Minutes Intravenous Every 12 hours 08/07/19 1540 08/11/19 1509   08/07/19 1000  anidulafungin (ERAXIS) 100 mg in sodium chloride 0.9 % 100 mL IVPB  Status:  Discontinued     100 mg 78 mL/hr over 100 Minutes Intravenous Every 24 hours 08/06/19 0934 08/08/19 1203   08/07/19 0800  ceFEPIme (MAXIPIME) 2 g in sodium chloride 0.9 % 100 mL IVPB  Status:  Discontinued     2 g 200 mL/hr over 30 Minutes Intravenous Every 24 hours 08/06/19 1036 08/07/19 1540   08/06/19 1000  anidulafungin (ERAXIS) 200 mg in sodium chloride 0.9 % 200 mL IVPB    Note to Pharmacy: please   200 mg 78 mL/hr over 200 Minutes Intravenous Every 24 hours 08/06/19 0925 08/06/19 1600   08/06/19 0800  ceFEPIme (MAXIPIME) 2 g in sodium chloride 0.9 % 100 mL IVPB  Status:  Discontinued     2 g 200 mL/hr over 30 Minutes Intravenous Every 12 hours 08/06/19 0733 08/06/19 1036   08/02/19 2200  ceFAZolin (ANCEF) IVPB 2g/100 mL premix  Status:  Discontinued     2  g 200 mL/hr over 30 Minutes Intravenous Every 8 hours 08/02/19 1338 08/06/19 0733   08/02/19 1400  vancomycin (VANCOCIN) 1,250 mg in sodium chloride 0.9 % 250 mL IVPB  Status:  Discontinued     1,250 mg 166.7 mL/hr over 90 Minutes Intravenous Every 24 hours 08/01/19 1320 08/02/19 1332   08/01/19 1330  vancomycin (VANCOCIN) 2,000 mg in sodium chloride 0.9 % 500 mL IVPB     2,000 mg 250 mL/hr over 120 Minutes Intravenous  Once 08/01/19 1158 08/01/19 1636   08/01/19 1300  ceFEPIme (MAXIPIME) 2 g in sodium chloride 0.9 % 100 mL IVPB  Status:  Discontinued     2 g 200 mL/hr over 30 Minutes Intravenous Every 12 hours 08/01/19 1158 08/02/19 1332   07/26/19 1600  remdesivir 100 mg in sodium chloride 0.9 % 250 mL IVPB     100 mg 500 mL/hr over 30 Minutes Intravenous Every 24 hours 07/26/19 0132 07/29/19 1823   07/26/19 0215  remdesivir 200 mg in sodium chloride 0.9 % 250 mL IVPB     200 mg 500 mL/hr over 30 Minutes Intravenous Once 07/26/19 0132 07/26/19 0520       Objective   Vitals:   09/09/19 0600 09/09/19 0700 09/09/19 0800 09/09/19 0900  BP: (!) 146/80 139/83 (!) 147/79 135/86  Pulse: 93 90 92 88  Resp: 20 (!) 22 (!) 21 20  Temp:      TempSrc:      SpO2: 96% 98% 99% 100%  Weight: 87.3 kg     Height:        SpO2: 100 % O2 Flow Rate (L/min): 5 L/min FiO2 (%): 35 %  Wt Readings from Last 3 Encounters:  09/09/19 87.3 kg  11/08/17 96.2 kg  09/04/16 94.3 kg     Intake/Output Summary (Last 24 hours)  at 09/09/2019 1007 Last data filed at 09/09/2019 0900 Gross per 24 hour  Intake 1670 ml  Output 25 ml  Net 1645 ml    Physical Exam:  General exam: Appears calm and comfortable with trach in place. Respiratory system: Rhonchi bilaterally. Respiratory effort normal. Cardiovascular system: S1 & S2 heard, RRR. No JVD, murmurs, rubs, gallops or clicks.  +2 pitting edema bilateral upper extremities. Gastrointestinal system: Abdomen is nondistended, soft and nontender. No  organomegaly or masses felt. Normal bowel sounds heard. Central nervous system: Slightly lethargic but seems to be oriented.  Following simple commands. Extremities: Boots in place in lower extremities. Psychiatry: Unable to assess.  I have personally reviewed the following:   Data Reviewed:  CBC Recent Labs  Lab 09/05/19 0922 09/05/19 2016 09/06/19 0951 09/07/19 0321 09/08/19 0421  WBC 14.0* 13.3* 15.6* 14.4* 13.9*  HGB 6.8* 8.4* 8.0* 8.5* 7.6*  HCT 21.8* 26.1* 24.6* 26.7* 24.0*  PLT 309 290 296 277 264  MCV 92.0 90.6 89.8 90.5 90.6  MCH 28.7 29.2 29.2 28.8 28.7  MCHC 31.2 32.2 32.5 31.8 31.7  RDW 15.5 15.9* 15.6* 15.3 14.5    Chemistries  Recent Labs  Lab 09/03/19 0440 09/04/19 0730 09/05/19 0922 09/07/19 0321  NA 135 135 134* 137  K 4.2 4.5 3.9 4.1  CL 98 94* 93* 95*  CO2 _0 GLUCOSE 118* 103* 100* 98  BUN 75* 112* 80* 41*  CREATININE 4.26* 5.76* 4.44* 2.92*  CALCIUM 8.3* 8.7* 8.7* 8.4*   ------------------------------------------------------------------------------------------------------------------ No results for input(s): CHOL, HDL, LDLCALC, TRIG, CHOLHDL, LDLDIRECT in the last 72 hours.  Lab Results  Component Value Date   HGBA1C 6.1 (H) 07/26/2019   ------------------------------------------------------------------------------------------------------------------ No results for input(s): TSH, T4TOTAL, T3FREE, THYROIDAB in the last 72 hours.  Invalid input(s): FREET3 ------------------------------------------------------------------------------------------------------------------ No results for input(s): VITAMINB12, FOLATE, FERRITIN, TIBC, IRON, RETICCTPCT in the last 72 hours.  Coagulation profile No results for input(s): INR, PROTIME in the last 168 hours.  No results for input(s): DDIMER in the last 72 hours.  Cardiac Enzymes No results for input(s): CKMB, TROPONINI, MYOGLOBIN in the last 168 hours.  Invalid input(s):  CK ------------------------------------------------------------------------------------------------------------------ No results found for: BNP  Micro Results No results found for this or any previous visit (from the past 240 hour(s)).  Radiology Reports Ct Chest Wo Contrast  Result Date: 09/01/2019 CLINICAL DATA:  Coronavirus infection. Chest tube placement for right pneumothorax. EXAM: CT CHEST WITHOUT CONTRAST TECHNIQUE: Multidetector CT imaging of the chest was performed following the standard protocol without IV contrast. COMPARISON:  Chest radiography same day. CT chest 08/13/2019 FINDINGS: Cardiovascular: Heart size upper limits of normal. Minimal aortic atherosclerosis. No acute vascular finding visible. Mediastinum/Nodes: No mass or lymphadenopathy. Lungs/Pleura: Tracheostomy remains in place. Widespread patchy pulmonary infiltrates in the left lung persist, improved since late October. Better aeration in the left lower lobe. On the right, chest tube remains in place anteriorly, in direct communication with pleural air. Total amount of pleural air estimated at 5-10%. No evidence that this is increasing. Widespread patchy pulmonary infiltrates as seen previously, with a large air cyst in the right lower lung measuring 9.5 x 7.7 x 7.1 cm. I think this is in the right middle lobe, but find it difficult to state that with certainty. Some chance this could be in the right lower lobe, less likely. In any case, this is likely subsequent to barotrauma. Upper Abdomen: Negative Musculoskeletal: Negative IMPRESSION: 1. Right chest tube remains in place anteriorly, in  direct communication with pleural air. Total amount of pleural air estimated at 5-10%. 2. Widespread patchy pulmonary infiltrates, improved since late October. Better aeration in the left lower lobe. 3. Large air cyst in the right lower lung measuring 9.5 x 7.7 x 7.1 cm. This is presumably subsequent to barotrauma. This is slightly larger than  on the study of 08/13/2019. 4. Tracheostomy remains in place. Aortic Atherosclerosis (ICD10-I70.0). Electronically Signed   By: Nelson Chimes M.D.   On: 09/01/2019 15:53   Ct Chest Wo Contrast  Result Date: 08/13/2019 CLINICAL DATA:  COVID-19 positivity, follow-up patchy infiltrates EXAM: CT CHEST WITHOUT CONTRAST TECHNIQUE: Multidetector CT imaging of the chest was performed following the standard protocol without IV contrast. COMPARISON:  07/25/2019 FINDINGS: Cardiovascular: Examination is somewhat limited due to lack of IV contrast. Atherosclerotic calcifications of the thoracic aorta are seen. No cardiac enlargement is noted. No aneurysmal dilatation is seen. Right-sided central line is noted extending into the cavoatrial junction. Left jugular central line is noted as well. Mediastinum/Nodes: Endotracheal tube and gastric catheter are seen. A blocking catheter is noted within the right lower lobe bronchi stable from prior exams. Thoracic inlet appears within normal limits. No sizable mediastinal or hilar adenopathy is noted. The esophagus is within normal limits as visualized. Lungs/Pleura: The lungs demonstrate patchy infiltrates similar to that seen on prior CT although the degree of consolidation has improved in the interval from the prior exam. Persistent right lower lobe and left lower lobe consolidation is noted. Complicated pleural effusion is noted on the right. A considerable amount of air is noted within the pleural effusion as well as within the pleural space. Chest tube is noted anteriorly. The air is likely related to the recent bronchoscopy and manipulation of the bronchial occlusion tube. Upper Abdomen: Visualized upper abdomen shows gastric catheter within the stomach as well as multiple ligation clips within the distal stomach. Musculoskeletal: No chest wall mass or suspicious bone lesions identified. IMPRESSION: Improved aeration within the lungs when compared with the prior CT  examination. There remains consolidation within the lower lobes bilaterally worse on the right than the left. Complicated pleural effusion on the right is noted with chest tube in place. The air within the pleural space is likely related to the recent manipulation of the bronchial occlusion tube. Tubes and lines as described above. Aortic Atherosclerosis (ICD10-I70.0). Electronically Signed   By: Inez Catalina M.D.   On: 08/13/2019 15:32   Mr Brain Wo Contrast  Result Date: 08/26/2019 CLINICAL DATA:  Altered level of consciousness. History of COVID-19. EXAM: MRI HEAD WITHOUT CONTRAST TECHNIQUE: Multiplanar, multiecho pulse sequences of the brain and surrounding structures were obtained without intravenous contrast. COMPARISON:  Head CT 01/05/2014 FINDINGS: Brain: There is cortical restricted diffusion posteriorly in the right frontal lobe consistent with acute infarct. Additional smaller regions of diffusion abnormality are present in the posterior left frontal lobe, anterior left frontal lobe, and right greater than left medial parietal lobes as well as right cerebellum with normal to slightly reduced ADC compatible with acute to early subacute infarcts. There is mild associated T2 hyperintensity/edema associated with the infarcts. A chronic microhemorrhage is noted in the region of the right external capsule. No mass, midline shift, or extra-axial fluid collection is identified. The ventricles and sulci are within normal limits for age. The pituitary gland is borderline prominent in height without a discrete sellar mass identified on this nondedicated study. Vascular: Major intracranial vascular flow voids are preserved. Skull and upper cervical spine: Unremarkable  bone marrow signal. Sinuses/Orbits: Unremarkable orbits. Large bilateral mastoid effusions. Mild left frontal sinus mucosal thickening. Other: None. IMPRESSION: 1. Small acute to early subacute infarcts involving bilateral cerebral cortex and right  cerebellum. 2. Large bilateral mastoid effusions. Electronically Signed   By: Logan Bores M.D.   On: 08/26/2019 19:33   Ir Fluoro Guide Cv Line Left  Result Date: 09/02/2019 CLINICAL DATA:  Renal failure, needs durable venous access for hemodialysis. Currently using temporary dialysis catheter in the left IJ. History of right IJ occlusion. EXAM: TUNNELED HEMODIALYSIS CATHETER PLACEMENT WITH ULTRASOUND AND FLUOROSCOPIC GUIDANCE TECHNIQUE: The procedure, risks, benefits, and alternatives were explained to the family. Questions regarding the procedure were encouraged and answered. The family understands and consents to the procedure. Patient was receiving adequate prophylactic antibiotic coverage already. Patency of the left IJ vein was confirmed with ultrasound with image documentation. An appropriate skin site was determined. Region was prepped using maximum barrier technique including cap and mask, sterile gown, sterile gloves, large sterile sheet, and Chlorhexidine as cutaneous antisepsis. The region was infiltrated locally with 1% lidocaine. Intravenous Fentanyl 69mg and Versed 1.563mwere administered as conscious sedation during continuous monitoring of the patient's level of consciousness and physiological / cardiorespiratory status by the radiology RN, with a total moderate sedation time of 22 minutes. Under real-time ultrasound guidance, the left IJ vein was accessed with a 21 gauge micropuncture needle; the needle tip within the vein was confirmed with ultrasound image documentation. The 018 wire would not advance centrally. Needle exchanged over the 018 guidewire for transitional dilator, which allowed advancement of a stiff angled glidewire, negotiated to the SVC. The transitional dilator was exchanged for a 5 French angiographic catheter, advanced into the IVC. A Palindrome 23 hemodialysis catheter was tunneled from the left anterior chest wall approach to the left IJ dermatotomy site. The MPA  catheter was exchanged over an Amplatz wire for serial vascular dilators which allow placement of a peel-away sheath, through which the catheter was advanced under intermittent fluoroscopy, positioned with its tips in the proximal right atrium. Spot chest radiograph confirms good catheter position. No pneumothorax. Catheter was flushed and primed per protocol. Catheter secured externally with O Prolene sutures. The left IJ dermatotomy site was closed with Dermabond. COMPLICATIONS: COMPLICATIONS None immediate FLUOROSCOPY TIME:  3 minutes 24 seconds; 12 mGy COMPARISON:  None IMPRESSION: 1. Technically successful placement of tunneled left IJ hemodialysis catheter with ultrasound and fluoroscopic guidance. Ready for routine use. ACCESS: Remains approachable for percutaneous intervention as needed. Electronically Signed   By: D Lucrezia Europe.D.   On: 09/02/2019 11:48   Ir [facility] V[facility] Access Left  Result Date: 09/02/2019 CLINICAL DATA:  Renal failure, needs durable venous access for hemodialysis. Currently using temporary dialysis catheter in the left IJ. History of right IJ occlusion. EXAM: TUNNELED HEMODIALYSIS CATHETER PLACEMENT WITH ULTRASOUND AND FLUOROSCOPIC GUIDANCE TECHNIQUE: The procedure, risks, benefits, and alternatives were explained to the family. Questions regarding the procedure were encouraged and answered. The family understands and consents to the procedure. Patient was receiving adequate prophylactic antibiotic coverage already. Patency of the left IJ vein was confirmed with ultrasound with image documentation. An appropriate skin site was determined. Region was prepped using maximum barrier technique including cap and mask, sterile gown, sterile gloves, large sterile sheet, and Chlorhexidine as cutaneous antisepsis. The region was infiltrated locally with 1% lidocaine. Intravenous Fentanyl 7593mand Versed 1.5mg83mre administered as conscious sedation during continuous monitoring of the  patient's level of consciousness and physiological /  cardiorespiratory status by the radiology RN, with a total moderate sedation time of 22 minutes. Under real-time ultrasound guidance, the left IJ vein was accessed with a 21 gauge micropuncture needle; the needle tip within the vein was confirmed with ultrasound image documentation. The 018 wire would not advance centrally. Needle exchanged over the 018 guidewire for transitional dilator, which allowed advancement of a stiff angled glidewire, negotiated to the SVC. The transitional dilator was exchanged for a 5 French angiographic catheter, advanced into the IVC. A Palindrome 23 hemodialysis catheter was tunneled from the left anterior chest wall approach to the left IJ dermatotomy site. The MPA catheter was exchanged over an Amplatz wire for serial vascular dilators which allow placement of a peel-away sheath, through which the catheter was advanced under intermittent fluoroscopy, positioned with its tips in the proximal right atrium. Spot chest radiograph confirms good catheter position. No pneumothorax. Catheter was flushed and primed per protocol. Catheter secured externally with O Prolene sutures. The left IJ dermatotomy site was closed with Dermabond. COMPLICATIONS: COMPLICATIONS None immediate FLUOROSCOPY TIME:  3 minutes 24 seconds; 12 mGy COMPARISON:  None IMPRESSION: 1. Technically successful placement of tunneled left IJ hemodialysis catheter with ultrasound and fluoroscopic guidance. Ready for routine use. ACCESS: Remains approachable for percutaneous intervention as needed. Electronically Signed   By: Lucrezia Europe M.D.   On: 09/02/2019 11:48   Dg Chest 1v Repeat Same Day  Result Date: 09/08/2019 CLINICAL DATA:  Status post chest tube removal. History of right-sided pneumothorax. EXAM: CHEST - 1 VIEW SAME DAY COMPARISON:  One-view chest x-ray 09/08/2019 at 4:20 a.m. FINDINGS: The right-sided chest tube is been removed. Tiny lateral pneumothorax is  stable. Tracheostomy tube is in place. Left IJ line is stable. Feeding tube courses off the inferior of the film. Mild pulmonary vascular congestion is present. Bilateral effusions are present. Bibasilar airspace disease is stable. IMPRESSION: 1. Stable tiny right lateral pneumothorax. 2. Stable bibasilar airspace disease and effusions. 3. Stable mild pulmonary vascular congestion. Electronically Signed   By: San Morelle M.D.   On: 09/08/2019 14:26   Dg Chest Port 1 View  Result Date: 09/09/2019 CLINICAL DATA:  Shortness of breath. Pneumothorax on right. Encounter for chest tube removal. EXAM: PORTABLE CHEST 1 VIEW COMPARISON:  Radiograph yesterday. FINDINGS: Tracheostomy tube tip at the thoracic inlet. Weighted enteric tube tip below the diaphragm not included in the field of view. Left internal jugular catheter tip in the SVC. Unchanged small right basal lateral pneumothorax. Right lower lobe pulmonary pneumatocele again seen. Unchanged heart size and mediastinal contours. Slight worsening and mild diffuse pulmonary opacity, atelectasis versus edema. IMPRESSION: 1. Unchanged small right basal lateral pneumothorax. 2. Slight worsening diffuse pulmonary opacity, atelectasis versus edema. 3. Tracheostomy tube tip at the thoracic inlet. Electronically Signed   By: Keith Rake M.D.   On: 09/09/2019 06:26   Dg Chest Port 1 View  Result Date: 09/08/2019 CLINICAL DATA:  Pneumothorax. EXAM: PORTABLE CHEST 1 VIEW COMPARISON:  Radiograph 09/06/2019. Chest CT 09/01/2019 FINDINGS: Tracheostomy tube at the thoracic inlet. Weighted enteric tube tip below the field of view. Left-sided dialysis catheter tip in the mid SVC. Right chest tube remains in place with small right basilar pneumothorax. Unchanged pneumatocele in the right lung base. Slightly lower lung volumes from prior exam with slightly worsening aeration in the left lung and right upper lung zone. Cardiomegaly with unchanged mediastinal  contours. IMPRESSION: 1. Unchanged small right basilar pneumothorax with chest tube in place. Right lung base pneumatocele again  seen. 2. Slightly lower lung volumes from prior exam with slightly worsening aeration in the left lung and right upper lung zone and increasing diffuse opacity, pulmonary edema versus airspace disease/atelectasis. 3. Unchanged support apparatus. Electronically Signed   By: Keith Rake M.D.   On: 09/08/2019 04:44   Dg Chest Port 1 View  Result Date: 09/06/2019 CLINICAL DATA:  Evaluate pneumothorax. EXAM: PORTABLE CHEST 1 VIEW COMPARISON:  09/05/2019 FINDINGS: Tracheostomy tube tip is above the carina. There is a left IJ catheter with tip in the projection of the SVC. Feeding tube in place. Right chest tube is in place. Small right lateral basilar pneumothorax is unchanged. Large pneumatocele in the right lung base is again noted measuring approximately 10.5 cm. Unchanged. Bilateral interstitial and airspace opacities are stable. IMPRESSION: 1. Right-sided chest tube in place with small right lateral basilar pneumothorax. Unchanged 2. No change in aeration a lungs compared with previous exam. Electronically Signed   By: Kerby Moors M.D.   On: 09/06/2019 04:19   Dg Chest Port 1 View  Result Date: 09/05/2019 CLINICAL DATA:  Right pneumothorax. EXAM: PORTABLE CHEST 1 VIEW COMPARISON:  09/04/2019.  09/03/2019.  CT 09/01/2019. FINDINGS: Tracheostomy tube, feeding tube, left IJ dialysis catheter in stable position. Right chest tube in stable position. Stable tiny right base pneumothorax again noted. No interim change. Patchy bilateral pulmonary infiltrates again noted without interim change. Changes of pleural-parenchymal scarring again noted. Heart size stable. IMPRESSION: 1. Lines and tubes including right chest tube in stable position. Tiny right base pneumothorax again noted without interim change. 2. Patchy bilateral pulmonary infiltrates are again noted without interval  change. Electronically Signed   By: Marcello Moores  Register   On: 09/05/2019 06:04   Dg Chest Port 1 View  Result Date: 09/04/2019 CLINICAL DATA:  Right pneumothorax. EXAM: PORTABLE CHEST 1 VIEW COMPARISON:  September 03, 2019. FINDINGS: Stable cardiomediastinal silhouette. Tracheostomy and feeding tubes are unchanged in position. Left internal jugular dialysis catheter is unchanged. Stable right-sided chest tube is noted. Stable probable minimal right basilar pneumothorax is noted laterally. Stable bilateral lung opacities are noted. Bony thorax is unremarkable. IMPRESSION: Stable support apparatus. Stable bilateral lung opacities are noted. Stable position of right-sided chest tube with probable minimal right basilar pneumothorax. Electronically Signed   By: Marijo Conception M.D.   On: 09/04/2019 07:25   Dg Chest Port 1 View  Result Date: 09/03/2019 CLINICAL DATA:  Tracheostomy. EXAM: PORTABLE CHEST 1 VIEW COMPARISON:  September 01, 2019. FINDINGS: Tracheostomy tube is in good position. Interval placement of feeding tube which is seen entering stomach. Interval placement of left internal jugular dialysis catheter with distal tip in expected position of cavoatrial junction. Stable bilateral lung opacities are noted. Right-sided chest tube is noted with minimal right basilar pneumothorax. Bony thorax is unremarkable. IMPRESSION: Tracheostomy tube in good position. Interval placement of feeding tube and left internal jugular dialysis catheter. Stable right-sided chest tube is noted with minimal right basilar pneumothorax. Stable bilateral lung opacities are noted. Electronically Signed   By: Marijo Conception M.D.   On: 09/03/2019 10:25   Dg Chest Port 1 View  Result Date: 09/01/2019 CLINICAL DATA:  68 year old male with chest tube placement. COVID-19 positive. EXAM: PORTABLE CHEST 1 VIEW COMPARISON:  Chest radiograph dated 08/31/2019 and chest CT dated 08/13/2019 FINDINGS: Support apparatus in stable position. No  significant interval change in the appearance of the lungs and bilateral confluent densities predominantly involving the peripheral/subpleural regions as well as bibasilar lungs since the  prior radiograph. Right lung base cystic or bolus area appears similar to prior radiograph. No large or detectable pneumothorax. Faint linear density over the right apex appears similar to prior radiograph, likely artifactual and less likely represents pleural reflection. Stable cardiac silhouette. No acute osseous pathology. IMPRESSION: 1. No significant interval change in the appearance of the lungs compared to the prior radiograph. 2. Support apparatus in stable position. Electronically Signed   By: Anner Crete M.D.   On: 09/01/2019 09:55   Dg Chest Port 1 View  Result Date: 08/31/2019 CLINICAL DATA:  COVID-19 positive.  Tracheostomy tube. EXAM: PORTABLE CHEST 1 VIEW COMPARISON:  08/30/2019 FINDINGS: Tracheostomy tube in adequate position. Enteric tube courses into the stomach and off the film as tip is not visualized. Right medial basilar chest tube unchanged. Left IJ central venous catheter horizontally oriented over the SVC unchanged. Lungs are hypoinflated with persistent tiny right-sided pneumothorax. Stable opacification over the right midlung and right base. Stable hazy opacification over the left base/retrocardiac region. Remainder of the exam is unchanged. IMPRESSION: 1. Stable opacification over the right midlung and bibasilar regions. 2.  Stable tiny right-sided pneumothorax. 3.  Tubes and lines as described. Electronically Signed   By: Marin Olp M.D.   On: 08/31/2019 07:15   Dg Chest Port 1 View  Result Date: 08/30/2019 CLINICAL DATA:  Pneumothorax on right EXAM: PORTABLE CHEST 1 VIEW COMPARISON:  Chest radiograph 08/26/2019, 08/23/2019 FINDINGS: Stable cardiomediastinal contours. Unchanged for apparatus. Persistent small right basilar pneumothorax with chest tube in place. Persistent  infiltrates/atelectasis in the right mid upper lung and left base. No large pleural effusion. No acute finding in the visualized skeleton. IMPRESSION: Unchanged chest radiograph with persistent small right basilar pneumothorax and atelectasis/infiltrates in the right mid to upper lung as well as left base. Electronically Signed   By: Audie Pinto M.D.   On: 08/30/2019 09:42   Dg Chest Port 1 View  Result Date: 08/26/2019 CLINICAL DATA:  Pneumothorax. EXAM: PORTABLE CHEST 1 VIEW COMPARISON:  08/23/2019. FINDINGS: Tracheostomy tube, left IJ line, feeding tube, right chest tube in stable position. Stable right base pneumothorax. Stable atelectatic changes/infiltrate right upper lung and left base. Heart size stable. IMPRESSION: 1. Lines and tubes including right chest tube in stable position. Stable right base pneumothorax. 2. Stable right upper lung and left lower atelectatic changes/infiltrates. Chest is unchanged from prior exam. Electronically Signed   By: Marcello Moores  Register   On: 08/26/2019 10:36   Dg Chest Port 1 View  Result Date: 08/23/2019 CLINICAL DATA:  COVID-19 positive, endotracheal tube in place. EXAM: PORTABLE CHEST 1 VIEW COMPARISON:  Chest x-rays dated 08/22/2019. FINDINGS: Tubes and lines are stable in position, including a RIGHT-sided chest tube. Prominent lucency again noted at the RIGHT lung base, stable in the short-term interval, compatible with the air that was associated with a complicated pleural effusion as demonstrated on chest CT of 08/13/2019, at that time suspected to represent sequela of manipulation of a bronchial occlusion tube at bronchoscopy. Patchy airspace opacities are again seen bilaterally, stable, presumed pneumonia. No new lung findings. IMPRESSION: 1. Stable chest x-ray. Tubes and lines are stable in position, including the RIGHT-sided chest tube with tip positioned at the medial aspects of the RIGHT lung base 2. Patchy airspace opacities bilaterally, stable,  presumed bilateral pneumonia. 3. Stable appearance of the collection of air at the RIGHT lung base, as described above. 4. No new lung findings. Electronically Signed   By: Franki Cabot M.D.   On:  08/23/2019 09:04   Dg Chest Port 1 View  Result Date: 08/22/2019 CLINICAL DATA:  Status post tracheostomy. COVID-19 viral pneumonia. EXAM: PORTABLE CHEST 1 VIEW COMPARISON:  08/22/2019 FINDINGS: A new tracheostomy tube is seen in appropriate position. Feeding tube and bilateral central venous catheters remain in stable position. Tip of the right subclavian central venous catheter seen overlying the inferior right atrium. Heart size is within normal limits allowing for low lung volumes. Right chest tube remains in place. No pneumothorax visualized. Bullous disease again seen in right lung base. Increased atelectasis or airspace disease is seen in the retrocardiac left lung base. Mild bilateral peripheral pulmonary airspace disease shows no significant change. IMPRESSION: New tracheostomy tube in appropriate position. Right subclavian Center venous catheter tip overlies the lower right atrium. Increased atelectasis or airspace disease in retrocardiac left lung base. Electronically Signed   By: Marlaine Hind M.D.   On: 08/22/2019 16:49   Dg Chest Port 1 View  Result Date: 08/22/2019 CLINICAL DATA:  Intubated. EXAM: PORTABLE CHEST 1 VIEW COMPARISON:  08/21/2019 FINDINGS: Endotracheal tube in satisfactory position. Feeding tube extending into the distal stomach with its tip not included. Right subclavian catheter tip in the region of the superior cavoatrial junction or upper right atrium. No significant change in bilateral interstitial and patchy opacities and right basilar bullous changes. No definite pneumothorax. Minimal right pleural fluid. Normal sized heart. Thoracic spine degenerative changes. IMPRESSION: 1. No significant change in bilateral pneumonia and interstitial disease and right basilar bullous changes.  2. Minimal right pleural fluid. Electronically Signed   By: Claudie Revering M.D.   On: 08/22/2019 07:12   Dg Chest Port 1 View  Result Date: 08/21/2019 CLINICAL DATA:  COVID-19, intubation EXAM: PORTABLE CHEST 1 VIEW COMPARISON:  Portable exam 0535 hours compared to 08/20/2019 FINDINGS: Tip of endotracheal tube projects 3.4 cm above carina. Feeding tube extends into stomach. LEFT jugular dual-lumen central venous catheter tip projects over confluence of the SVC and LEFT brachiocephalic vein. Tip of RIGHT jugular line projects over RIGHT atrium; recommend withdrawal 4 cm to place tip at cavoatrial junction. RIGHT thoracostomy tube unchanged. Stable heart size mediastinal contours. Patchy BILATERAL airspace infiltrates consistent with pneumonia. No pleural effusion. Tiny loculated pneumothorax at lateral RIGHT base. IMPRESSION: Tiny residual RIGHT basilar atelectasis despite thoracostomy tube. Persistent BILATERAL pulmonary infiltrates consistent with pneumonia. Electronically Signed   By: Lavonia Dana M.D.   On: 08/21/2019 08:44   Dg Chest Port 1 View  Result Date: 08/20/2019 CLINICAL DATA:  68 year old male with a history of intubation EXAM: PORTABLE CHEST 1 VIEW COMPARISON:  08/19/2019, 08/18/2019, 08/17/2019 FINDINGS: Cardiomediastinal silhouette unchanged in size and contour. Low lung volumes persist. Unchanged endotracheal tube which terminates 2.4 cm above the carina. Unchanged gastric tube terminating in the stomach. Unchanged left IJ temporary hemodialysis catheter which terminates at the confluence of the left brachiocephalic vein and SVC. Unchanged right subclavian central venous catheter with the tip appearing to terminate superior right atrium. Patchy opacities of the bilateral lungs, unchanged from the prior. No pneumothorax. Blunting of left costophrenic angle with opacity in the retrocardiac region. IMPRESSION: Similar appearance of the lungs with low lung volumes and bilateral patchy opacities,  likely combination of atelectasis/consolidation, developing chronic changes, and possible small left pleural effusion. Unchanged endotracheal tube, left IJ temporary hemodialysis catheter, right subclavian central venous catheter, and gastric tube. Electronically Signed   By: Corrie Mckusick D.O.   On: 08/20/2019 07:50   Dg Chest Port 1 View  Result Date:  08/19/2019 CLINICAL DATA:  Pneumonia and COVID-19. EXAM: PORTABLE CHEST 1 VIEW COMPARISON:  August 18, 2019. FINDINGS: Stable cardiomediastinal silhouette. Endotracheal and nasogastric tubes are unchanged in position. Left internal jugular and right subclavian catheters are unchanged in position. Stable right-sided chest tube is noted with probable stable loculated right basilar hydropneumothorax. Stable opacities are noted throughout both lungs concerning for pneumonia. Bony thorax is unremarkable. IMPRESSION: Stable support apparatus. Stable right-sided chest tube with probable right basilar hydropneumothorax. Stable bilateral lung opacities. Electronically Signed   By: Marijo Conception M.D.   On: 08/19/2019 07:44   Dg Chest Port 1 View  Result Date: 08/18/2019 CLINICAL DATA:  Follow-up endotracheal tube EXAM: PORTABLE CHEST 1 VIEW COMPARISON:  08/17/2019 FINDINGS: Cardiac shadow is stable. Right subclavian central line is noted in satisfactory position. Endotracheal tube and gastric catheter are again seen and stable. Left jugular dialysis catheter is noted. Patchy opacities are again seen in both lungs. Right chest tube is again noted with loculated hydropneumothorax stable from the prior exam. IMPRESSION: Overall stable appearance of the chest. Electronically Signed   By: Inez Catalina M.D.   On: 08/18/2019 03:09   Dg Chest Port 1 View  Result Date: 08/17/2019 CLINICAL DATA:  Right-sided pneumothorax EXAM: PORTABLE CHEST 1 VIEW COMPARISON:  08/16/2019 FINDINGS: No significant interval change in AP portable examination with right-sided chest tube in  position and a small, persistent, loculated appearing hydropneumothorax about the right lung base. There is redemonstrated, extensive bilateral dense heterogeneous and interstitial airspace opacity, worse on the right. Support apparatus including endotracheal tubes, esophagogastric tube, left neck and right subclavian vascular catheters are unchanged. Cardiomegaly. IMPRESSION: 1. No significant interval change in AP portable examination with right-sided chest tube in position and a small, persistent, loculated appearing hydropneumothorax about the right lung base. 2. There is redemonstrated, extensive bilateral dense heterogeneous and interstitial airspace opacity, worse on the right, consistent with multifocal infection. 3.  Unchanged support apparatus. Electronically Signed   By: Eddie Candle M.D.   On: 08/17/2019 15:54   Dg Chest Port 1 View  Result Date: 08/16/2019 CLINICAL DATA:  68 year old male with positive COVID-19. EXAM: PORTABLE CHEST 1 VIEW COMPARISON:  Earlier radiograph dated 08/16/2019 FINDINGS: Support lines and tubes as seen previously. Diffuse bilateral airspace opacities similar to prior radiograph. Apparent area of lucency at the right costophrenic angle may be artifactual and related to skin fold or represent a small residual right pneumothorax. Stable cardiac silhouette. No acute osseous pathology. IMPRESSION: 1. Artifact versus small residual right pneumothorax at the right costophrenic angle. 2. Diffuse bilateral airspace opacities similar to prior radiograph. 3. Support lines and tubes as seen previously. Electronically Signed   By: Anner Crete M.D.   On: 08/16/2019 21:01   Dg Chest Port 1 View  Result Date: 08/16/2019 CLINICAL DATA:  Hypoxia EXAM: PORTABLE CHEST 1 VIEW COMPARISON:  August 15, 2019 FINDINGS: Endotracheal tube tip is 4.2 cm above the carina. Nasogastric tube tip and side port are below the diaphragm. Right subclavian catheter tip is in the right atrium. Chest  tube noted on the right inferiorly, unchanged in position. Left internal jugular catheter tip is at the junction of the left innominate vein and superior vena cava. There is a catheter with its tip overlying the right lower lobe bronchus medially, stable. No pneumothorax is evident currently. There is persistent airspace consolidation in the right mid and lower lung zones. There is patchy opacity in the left mid and lower lung zones, stable. No new  opacity evident. Heart is upper normal in size with pulmonary vascular normal. No adenopathy. No bone lesions. IMPRESSION: Stable tube and catheter positions. No pneumothorax evident currently. Airspace opacity bilaterally with areas of consolidation on the right, stable. No new opacity evident. Stable cardiac silhouette. Electronically Signed   By: Lowella Grip III M.D.   On: 08/16/2019 09:06   Dg Chest Port 1 View  Result Date: 08/15/2019 CLINICAL DATA:  Orogastric tube placement. EXAM: PORTABLE CHEST 1 VIEW COMPARISON:  Same day. FINDINGS: Stable cardiomediastinal silhouette. Distal tip of enteric tube is seen in proximal stomach. Endotracheal tube is unchanged. Left internal jugular catheter is unchanged. Right subclavian catheter is unchanged. Stable position of right-sided basilar chest tube is noted without pneumothorax. Stable bilateral lung opacities are noted. Stable catheter seen entering right mainstem bronchus compared to prior exam. Bony thorax is unremarkable. IMPRESSION: Feeding tube has been removed. There has been interval placement of what appears to be nasogastric tube with tip in expected position of proximal stomach. Stable bilateral lung opacities and other support apparatus are noted. Electronically Signed   By: Marijo Conception M.D.   On: 08/15/2019 14:55   Dg Chest Port 1 View  Result Date: 08/15/2019 CLINICAL DATA:  Chest tube, shortness of breath EXAM: PORTABLE CHEST 1 VIEW COMPARISON:  08/14/2019 FINDINGS: Support devices  including right basilar chest tube remain in place, unchanged. No definite pneumothorax currently. Patchy bilateral airspace opacities are again noted, unchanged. Heart is borderline in size. IMPRESSION: Patchy bilateral airspace disease again noted, unchanged. No definite visible pneumothorax. Electronically Signed   By: Rolm Baptise M.D.   On: 08/15/2019 07:58   Dg Chest Port 1 View  Result Date: 08/14/2019 CLINICAL DATA:  Pneumothorax, chest tube EXAM: PORTABLE CHEST 1 VIEW COMPARISON:  08/14/2019 FINDINGS: Right chest tube and remainder support devices are stable. No pneumothorax. Patchy bilateral airspace opacities, right greater than left again noted, stable. Mild cardiomegaly. No visible effusions. IMPRESSION: No visible pneumothorax.  No change since prior study Electronically Signed   By: Rolm Baptise M.D.   On: 08/14/2019 11:46   Dg Chest Port 1 View  Result Date: 08/14/2019 CLINICAL DATA:  Acute respiratory failure with hypoxemia. EXAM: PORTABLE CHEST 1 VIEW COMPARISON:  August 13, 2019. FINDINGS: Stable cardiomediastinal silhouette. Endotracheal and feeding tubes are unchanged in position. Stable left internal jugular and right subclavian catheters are noted. Stable catheter is seen extending into right mainstem bronchus. Stable bilateral lung opacities are noted. Stable right-sided chest tube without pneumothorax. Small bilateral pleural effusions may be present. Bony thorax is unremarkable. IMPRESSION: Grossly stable support apparatus. Stable bilateral lung opacities as described above. Electronically Signed   By: Marijo Conception M.D.   On: 08/14/2019 07:43   Dg Chest Port 1 View  Result Date: 08/13/2019 CLINICAL DATA:  Endotracheal to EXAM: PORTABLE CHEST 1 VIEW COMPARISON:  [id number] hours FINDINGS: Endotracheal tube and 2 central venous line unchanged. NG tube exchanged for feeding tube. A catheter extends through the lumen of the endotracheal tube into the RIGHT mainstem bronchus.  There is hydropneumothorax at the RIGHT lung base again noted. There is diffuse patchy airspace disease which is slightly worse on the RIGHT and similar on the LEFT. IMPRESSION: 1. Stable support apparatus. 2. No change in subpulmonic RIGHT hydropneumothorax. 3. No change in patchy airspace disease in the RIGHT upper lobe. 4. Slight worsening airspace disease in the LEFT lung. Electronically Signed   By: Suzy Bouchard M.D.   On: 08/13/2019 17:39  Dg Chest Port 1 View  Result Date: 08/13/2019 CLINICAL DATA:  Right pneumothorax EXAM: PORTABLE CHEST 1 VIEW COMPARISON:  08/13/2019 FINDINGS: Support devices including right chest tube remain in place, unchanged. Lucency again noted over the right lung base which could reflect loculated right pneumothorax. Airspace disease throughout the lungs bilaterally, right greater than left with improvement in aeration in the left lung since prior study. Heart is normal size. IMPRESSION: Continued lucency over the right lung base could reflect loculated right pneumothorax. Bilateral airspace disease, right greater than left. Improved airspace disease on the left since prior study. Electronically Signed   By: Rolm Baptise M.D.   On: 08/13/2019 11:34   Dg Chest Port 1 View  Result Date: 08/13/2019 CLINICAL DATA:  Respiratory failure EXAM: PORTABLE CHEST 1 VIEW COMPARISON:  Yesterday FINDINGS: Endotracheal tube tip is at the clavicular heads. Right subclavian central line with tip at the upper cavoatrial junction. Endobronchial blocker on the right in stable position over the right hilum. The orogastric tube reaches the stomach. Right base chest tube which crosses the midline, presumably anterior and accentuated by leftward rotation. Basal pneumothorax on the right is unchanged. Unchanged bilateral airspace disease. IMPRESSION: 1. Unchanged hardware positioning including endobronchial blocker tip overlapping the right hilum. 2. Unchanged bilateral airspace disease with  right base lucency more likely loculated pneumothorax rather than cavitary pneumonia Electronically Signed   By: Monte Fantasia M.D.   On: 08/13/2019 09:04   Dg Chest Port 1 View  Result Date: 08/12/2019 CLINICAL DATA:  Respiratory failure. EXAM: PORTABLE CHEST 1 VIEW COMPARISON:  Chest x-ray 08/10/2019. FINDINGS: Endotracheal tube, dual-lumen left IJ line, right PICC line in stable position. Right lower lobe bronchus balloon occlusion catheter stable position. Right chest tube in stable position. Small right base pneumothorax cannot be excluded on today's exam. Bibasilar atelectasis/infiltrates again noted. No interim change. Tiny bilateral pleural effusions. IMPRESSION: 1. Endotracheal tube, dual-lumen left IJ line, right PICC line stable position. Right lower lobe bronchus balloon occlusion catheter in stable position. Right chest tube in stable position. 2. Small right base pneumothorax cannot be excluded on today's exam. 3. Bibasilar atelectasis/infiltrates again noted without interim change. Critical Value/emergent results were called by telephone at the time of interpretation on 08/12/2019 at 7:31 am to the patient's nurse, who verbally acknowledged these results. Electronically Signed   By: Marcello Moores  Register   On: 08/12/2019 07:33   Dg Chest Port 1 View  Result Date: 08/10/2019 CLINICAL DATA:  COVID positive EXAM: PORTABLE CHEST 1 VIEW COMPARISON:  08/09/2019 FINDINGS: Left lung apex and lateral costophrenic angle are partially visualized. Endotracheal tube terminates 5 cm above the carina. Right chest tube.  No pneumothorax is seen. Left IJ dual lumen catheter terminates in the mid SVC. Right subclavian venous catheter terminates at the cavoatrial junction. Enteric tube terminates in the proximal stomach. Stable selective balloon occlusion catheter in the right lower lobe bronchus. Multifocal patchy opacities, lower lung predominant, grossly unchanged. No definite pleural effusions. The heart is  normal in size. IMPRESSION: Multifocal patchy opacities, grossly unchanged, compatible with pneumonia in this patient with known COVID. Stable right chest tube.  No pneumothorax is seen. Endotracheal tube terminates 5 cm above the carina. Additional stable support apparatus as above. Electronically Signed   By: Julian Hy M.D.   On: 08/10/2019 16:59   Dg Abd Portable 1v  Result Date: 08/30/2019 CLINICAL DATA:  NG tube placement EXAM: PORTABLE ABDOMEN - 1 VIEW COMPARISON:  08/16/2019 FINDINGS: There is a nasogastric tube  with the tip projecting over the stomach. There is no bowel dilatation to suggest obstruction. There is no evidence of pneumoperitoneum, portal venous gas or pneumatosis. There are no pathologic calcifications along the expected course of the ureters. The osseous structures are unremarkable. IMPRESSION: Nasogastric tube with the tip projecting over the stomach. Electronically Signed   By: Kathreen Devoid   On: 08/30/2019 15:56   Dg Abd Portable 1v  Result Date: 08/16/2019 CLINICAL DATA:  OG tube placement EXAM: PORTABLE ABDOMEN - 1 VIEW COMPARISON:  08/13/2019 FINDINGS: Nasogastric tube with the tip projecting over the stomach. There is no bowel dilatation to suggest obstruction. There is no evidence of pneumoperitoneum, portal venous gas or pneumatosis. There are no pathologic calcifications along the expected course of the ureters. The osseous structures are unremarkable. IMPRESSION: Nasogastric tube with the tip projecting over the stomach. Electronically Signed   By: Kathreen Devoid   On: 08/16/2019 12:09   Dg Abd Portable 1v  Result Date: 08/13/2019 CLINICAL DATA:  ETT advancement, Feeding tube placement EXAM: PORTABLE ABDOMEN - 1 VIEW COMPARISON:  Radiograph 08/10/2019 FINDINGS: NG tube has been replaced with a feeding tube. The tip of the feeding tube is within the second portion the duodenum. Gas in the stomach. No dilated loops of large or small bowel. Bibasilar effusions and  atelectasis. IMPRESSION: 1. Feeding tube with tip in the second portion the duodenum. 2. No evidence of bowel obstruction. 3. Bibasilar effusions and atelectasis. Electronically Signed   By: Suzy Bouchard M.D.   On: 08/13/2019 17:30     Time Spent in minutes 32 minutes     Darliss Cheney M.D on 09/09/2019 at 10:07 AM  To page go to [email or web address] - password [facility]

## 2019-09-09 NOTE — TOC Progression Note (Signed)
Transition of Care [facility]) - Progression Note    Patient Details  Name: [name] MRN: [id number] Date of Birth: 1951/05/22  [facility]) CM/SW Contact  Maryclare Labrador, RN Phone Number: 09/09/2019, 8:40 AM  Clinical Narrative:    CM covering unit today.  Pt has 46 days LOS, chest tube removed yesterday per notes, pt has been successful on TC however remains with thick secretions.  NG tube for tube feeds (barrer for SNF).  .  CM requested attending for [facility] referral consideration.  TOC will continue to follow    Expected Discharge Plan: ([facility] vs SNF) Barriers to Discharge: Continued Medical Work up  Expected Discharge Plan and Services Expected Discharge Plan: ([facility] vs SNF)                                               Social Determinants of Health (SDOH) Interventions    Readmission Risk Interventions No flowsheet data found.

## 2019-09-09 NOTE — Progress Notes (Signed)
   Vital Signs MEWS/VS Documentation      09/09/2019 1722 09/09/2019 1900 09/09/2019 1925 09/09/2019 1944   MEWS Score:  2  2  -  2   MEWS Score Color:  Yellow  Yellow  -  Yellow   Resp:  (!) 22  -  -  -   Pulse:  96  -  -  -   BP:  128/84  (!) 149/89  -  -   Temp:  98.7 F (37.1 C)  98.6 F (37 C)  -  -   O2 Device:  -  Tracheostomy Collar  Tracheostomy Collar  -   O2 Flow Rate (L/min):  -  -  10 L/min  -   FiO2 (%):  -  -  40 %  -   Level of Consciousness:  Responds to Voice  -  -  -       No acute change. RR continue to vary. Will do vitals every two hours.     [name] 09/09/2019,9:00 PM

## 2019-09-09 NOTE — Progress Notes (Signed)
SLP Cancellation Note  Patient Details Name: [name] MRN: [id number] DOB: [birth year]   Cancelled treatment:        Orders for swallow evaluation received and appreciated. Attempted to see pt for clinical swallow evaluation; unfortunately, pt was of floor for dialysis at time of SLP attempt.  Will reattempt as schedule permits.   Kalyb Pemble E [employer] 09/09/2019, 11:44 AM

## 2019-09-09 NOTE — Progress Notes (Signed)
RT note-Patient transferred from hemodialysis to 2W, All supplies in room.

## 2019-09-09 NOTE — Progress Notes (Signed)
Report called to 2W RN 

## 2019-09-09 NOTE — Progress Notes (Signed)
Subjective:  No acute events overnight.... CT removed- trach collar Objective Vital signs in last 24 hours: Vitals:   09/09/19 0414 09/09/19 0500 09/09/19 0600 09/09/19 0700  BP: 135/72 135/81 (!) 146/80 139/83  Pulse: 88 88 93 90  Resp: 19 (!) 21 20 (!) 22  Temp:      TempSrc:      SpO2:  95% 96% 98%  Weight:   87.3 kg   Height:       Weight change:   Intake/Output Summary (Last 24 hours) at 09/09/2019 0858 Last data filed at 09/09/2019 0700 Gross per 24 hour  Intake 1670 ml  Output 25 ml  Net 1645 ml    Assessment/ Plan: Pt is a 68 y.o. yo male with CKD who was admitted on 07/25/2019 with COVID pna-  Has progressed to being HD req since 10/15  Assessment/Plan: 1. Pulm-  Chest tube out.  Still with trach-  Being downsized- no abx-  Per CCM 2. ESRD- presumed as has been HD dep for over a month without any current signs of renal recovery.  Have been doing HD on a TTS schedule here, due today-  Has TDC.  Need to start thinking about a perm AV access 3. Anemia- has been low- also with GIB- no hep with HD- on ESA-  Will check iron stores as well- supportive care   4. Secondary hyperparathyroidism- phos is OK - no binders 5. HTN/volume- no scheduled BP meds- trying to challenge volume   Louis Meckel    Labs: Basic Metabolic Panel: Recent Labs  Lab 09/04/19 0730 09/05/19 0922 09/07/19 0321  NA 135 134* 137  K 4.5 3.9 4.1  CL 94* 93* 95*  CO2 23 26 26   GLUCOSE 103* 100* 98  BUN 112* 80* 41*  CREATININE 5.76* 4.44* 2.92*  CALCIUM 8.7* 8.7* 8.4*  PHOS  --   --  3.5   Liver Function Tests: Recent Labs  Lab 09/07/19 0321  ALBUMIN 1.5*   No results for input(s): LIPASE, AMYLASE in the last 168 hours. No results for input(s): AMMONIA in the last 168 hours. CBC: Recent Labs  Lab 09/05/19 0922 09/05/19 2016 09/06/19 0951 09/07/19 0321 09/08/19 0421  WBC 14.0* 13.3* 15.6* 14.4* 13.9*  HGB 6.8* 8.4* 8.0* 8.5* 7.6*  HCT 21.8* 26.1* 24.6* 26.7* 24.0*  MCV  92.0 90.6 89.8 90.5 90.6  PLT 309 290 296 277 264   Cardiac Enzymes: No results for input(s): CKTOTAL, CKMB, CKMBINDEX, TROPONINI in the last 168 hours. CBG: Recent Labs  Lab 09/08/19 1521 09/08/19 1951 09/08/19 2319 09/09/19 0408 09/09/19 0750  GLUCAP 112* 93 104* 103* 106*    Iron Studies: No results for input(s): IRON, TIBC, TRANSFERRIN, FERRITIN in the last 72 hours. Studies/Results: Dg Chest 1v Repeat Same Day  Result Date: 09/08/2019 CLINICAL DATA:  Status post chest tube removal. History of right-sided pneumothorax. EXAM: CHEST - 1 VIEW SAME DAY COMPARISON:  One-view chest x-ray 09/08/2019 at 4:20 a.m. FINDINGS: The right-sided chest tube is been removed. Tiny lateral pneumothorax is stable. Tracheostomy tube is in place. Left IJ line is stable. Feeding tube courses off the inferior of the film. Mild pulmonary vascular congestion is present. Bilateral effusions are present. Bibasilar airspace disease is stable. IMPRESSION: 1. Stable tiny right lateral pneumothorax. 2. Stable bibasilar airspace disease and effusions. 3. Stable mild pulmonary vascular congestion. Electronically Signed   By: San Morelle M.D.   On: 09/08/2019 14:26   Dg Chest [facility] 1 View  Result  Date: 09/09/2019 CLINICAL DATA:  Shortness of breath. Pneumothorax on right. Encounter for chest tube removal. EXAM: PORTABLE CHEST 1 VIEW COMPARISON:  Radiograph yesterday. FINDINGS: Tracheostomy tube tip at the thoracic inlet. Weighted enteric tube tip below the diaphragm not included in the field of view. Left internal jugular catheter tip in the SVC. Unchanged small right basal lateral pneumothorax. Right lower lobe pulmonary pneumatocele again seen. Unchanged heart size and mediastinal contours. Slight worsening and mild diffuse pulmonary opacity, atelectasis versus edema. IMPRESSION: 1. Unchanged small right basal lateral pneumothorax. 2. Slight worsening diffuse pulmonary opacity, atelectasis versus edema. 3.  Tracheostomy tube tip at the thoracic inlet. Electronically Signed   By: Keith Rake M.D.   On: 09/09/2019 06:26   Dg Chest Port 1 View  Result Date: 09/08/2019 CLINICAL DATA:  Pneumothorax. EXAM: PORTABLE CHEST 1 VIEW COMPARISON:  Radiograph 09/06/2019. Chest CT 09/01/2019 FINDINGS: Tracheostomy tube at the thoracic inlet. Weighted enteric tube tip below the field of view. Left-sided dialysis catheter tip in the mid SVC. Right chest tube remains in place with small right basilar pneumothorax. Unchanged pneumatocele in the right lung base. Slightly lower lung volumes from prior exam with slightly worsening aeration in the left lung and right upper lung zone. Cardiomegaly with unchanged mediastinal contours. IMPRESSION: 1. Unchanged small right basilar pneumothorax with chest tube in place. Right lung base pneumatocele again seen. 2. Slightly lower lung volumes from prior exam with slightly worsening aeration in the left lung and right upper lung zone and increasing diffuse opacity, pulmonary edema versus airspace disease/atelectasis. 3. Unchanged support apparatus. Electronically Signed   By: Keith Rake M.D.   On: 09/08/2019 04:44   Medications: Infusions: . sodium chloride Stopped (09/01/19 0000)  .  ceFAZolin (ANCEF) IV Stopped (09/08/19 2237)  . dextrose Stopped (08/16/19 1623)  . feeding supplement (NEPRO CARB STEADY) 45 mL/hr at 09/08/19 1900    Scheduled Medications: . aspirin  325 mg Per Tube Daily  . atorvastatin  40 mg Oral q1800  . B-complex with vitamin C  1 tablet Per Tube Daily  . chlorhexidine  15 mL Mouth/Throat BID  . Chlorhexidine Gluconate Cloth  6 each Topical Daily  . Chlorhexidine Gluconate Cloth  6 each Topical Q0600  . darbepoetin (ARANESP) injection - NON-DIALYSIS  100 mcg Subcutaneous Q Wed-1800  . feeding supplement (PRO-STAT SUGAR FREE 64)  30 mL Per Tube QID  . Gerhardt's butt cream   Topical TID  . heparin injection (subcutaneous)  5,000 Units  Subcutaneous Q8H  . mouth rinse  15 mL Mouth Rinse 10 times per day  . pantoprazole sodium  40 mg Per Tube Daily  . sucralfate  1 g Oral Q6H    have reviewed scheduled and prn medications.  Physical Exam: General: seems more alert-  Nodded no to pain  Heart: RRR Lungs: mostly clear Abdomen: soft, non tender Extremities: pitting edema Dialysis Access: left TDC     09/09/2019,8:58 AM  LOS: 46 days

## 2019-09-09 NOTE — Progress Notes (Signed)
Nutrition Follow-up  DOCUMENTATION CODES:   Not applicable  INTERVENTION:   Tube Feeding:  Continue Nepro at 45 ml/hr Continue Pro-Stat 30 mL QID Provides 148 g of protein, 2344 kcals and 788 mL of free water. Meets 100% estimated calorie and protein needs If electrolytes remain low/wdl, may no longer need renal formula. Continue to assess  Continue B-complex with C; switch to Rena-Vit when able to take meds by mouth   NUTRITION DIAGNOSIS:   Inadequate oral intake related to inability to eat(pt sedated and ventilated) as evidenced by NPO status.  Being addressed via TF   GOAL:   Patient will meet greater than or equal to 90% of their needs  Progressing  MONITOR:   Labs, TF tolerance, Skin, I & O's  REASON FOR ASSESSMENT:   Consult Enteral/tube feeding initiation and management  ASSESSMENT:   68 y/o male admitted with PNA and COVID 19   Tolerating trach collar, tolerating speaking valve, Chest tube removed 11/17  Receiving iHD today. Likely ESRD given no renal recovery per MD, possible plan for permanent AV access Noted plan for clinical swallow evaluation by SLP  Nepro at 45 ml/hr with Pro-Stat 30 mL QID via Cortrak, remains NPO  Hypokalemic today, noted Lokelama discontinued on 11/13. Phosphorus wdl.   Labs: sodium 133, potassium 3.2 (L), phosphorus wdl, magnesium wdl, corrected calcium 10.5, albumin 1.5 Meds: B-complex with Vit C   Diet Order:   Diet Order            Diet NPO time specified  Diet effective midnight              EDUCATION NEEDS:   No education needs have been identified at this time  Skin:  Skin Assessment: Skin Integrity Issues: Skin Integrity Issues:: DTI, Other (Comment) DTI: buttocks Stage I: lip Other: wound to penis  Last BM:  11/17 rectal tube  Height:   Ht Readings from Last 1 Encounters:  09/07/19 5\' 10"  (1.778 m)    Weight:   Wt Readings from Last 1 Encounters:  09/09/19 88.1 kg    Ideal Body  Weight:  75.45 kg  BMI:  Body mass index is 27.87 kg/m.  Estimated Nutritional Needs:   Kcal:  2200-2400  Protein:  130-150 gm  Fluid:  1000 mL plus UOP   [employer] MS, RDN, LDN, CNSC [phone] Pager  [phone] Weekend/On-Call Pager

## 2019-09-09 NOTE — Progress Notes (Signed)
  Speech Language Pathology Treatment: Nada Boozer Speaking valve;Cognitive-Linquistic  Patient Details Name: [name] MRN: [id number] DOB: [birth year] Today's Date: 09/09/2019 Time: 0092-3300 SLP Time Calculation (min) (ACUTE ONLY): 19 min  Assessment / Plan / Recommendation Clinical Impression  Trach changed to Shiley 8 cuffless.  Pt tolerated PMV for ~15 minutes with no changes in vital signs.  Per chart review and confirmation with RN, pt has been speaking with valve in place over the weekend.  Today pt was alert with eyes open and nodded head, but would not phonate on command and did not attempt to speak.  SLP attempted to engage pt in conversation about his reported interests without success.  There were thick red secretions oozing at trach.  SLP removed with suction.  There was mild cough with valve placed, but no expectoration of secretions.  Secretions noted inside of PMV.  SLP cleaned PMV before leaving and left valve in case with lid open to allow for air drying.  Consider placing orders for swallowing evaluation when pt is medically appropriate.   HPI HPI: 68 year old with a history of GERD and BPH who presented to [facility], ED with S OB and was found to be hypoxic.  He was initially diagnosed with COVID-19 September 20.  1 to 2 days prior to his presentation he developed worsening shortness of breath with anorexia. Admitted on 10/3, intubated.  EGD on 10/17 showed normal esophagus but excessive gastric fluid. CT scan of the chest on 10/21 showed findings consistent with an empyema as well as persistent pneumothorax.  Bedside bronchoscopy revealed material in the airway. Trached on 10/30. On ATC by 10/31.Additionally MRI on 11/4 showed There is cortical restricted diffusion posteriorly in the right frontal lobe consistent with acute infarct. Additional smaller regions of diffusion abnormality are present in the posterior left frontal lobe, anterior left frontal lobe, and right greater  than left medial parietal lobes as well as right cerebellum.       SLP Plan  Continue with current plan of care       Recommendations   Consider swallow evaluation orders      Patient may use Passy-Muir Speech Valve: Intermittently with supervision;During all therapies with supervision PMSV Supervision: Full MD: Please consider changing trach tube to : Smaller size         General recommendations: (Consider placing orders for swallow evaluation when medically appropriate) Oral Care Recommendations: Oral care QID Follow up Recommendations: 24 hour supervision/assistance SLP Visit Diagnosis: Aphonia (R49.1);Cognitive communication deficit (R41.841) Plan: Continue with current plan of care       [place] , [place], [place] Office: [phone]; Pager (11/17): [phone] (via text page only) 09/09/2019, 9:44 AM

## 2019-09-09 NOTE — Progress Notes (Signed)
Pt. Low bp as documented with HD tx. Dr. Moshe Cipro made aware orders for Albumin as documented. May give Albumin every 1 hour prn as needed for BP<80 to obtain UF goal

## 2019-09-09 NOTE — Plan of Care (Addendum)
  Problem: Respiratory: Goal: Will maintain a patent airway Outcome: Progressing Goal: Complications related to the disease process, condition or treatment will be avoided or minimized Outcome: Progressing   

## 2019-09-09 NOTE — Procedures (Signed)
Patient was seen on dialysis and the procedure was supervised.  BFR 400  Via TDC BP is  91/51.   Patient appears to be tolerating treatment well- BP low, will use albumin to support BP   Louis Meckel 09/09/2019

## 2019-09-10 ENCOUNTER — Inpatient Hospital Stay (HOSPITAL_COMMUNITY): Payer: Medicare HMO

## 2019-09-10 DIAGNOSIS — U071 COVID-19: Secondary | ICD-10-CM

## 2019-09-10 DIAGNOSIS — N186 End stage renal disease: Secondary | ICD-10-CM | POA: Diagnosis not present

## 2019-09-10 DIAGNOSIS — J80 Acute respiratory distress syndrome: Secondary | ICD-10-CM | POA: Diagnosis not present

## 2019-09-10 LAB — TYPE AND SCREEN
ABO/RH(D): O POS
Antibody Screen: NEGATIVE
Unit division: 0

## 2019-09-10 LAB — GLUCOSE, CAPILLARY
Glucose-Capillary: 84 mg/dL (ref 70–99)
Glucose-Capillary: 96 mg/dL (ref 70–99)
Glucose-Capillary: 94 mg/dL (ref 70–99)
Glucose-Capillary: 101 mg/dL — ABNORMAL HIGH (ref 70–99)
Glucose-Capillary: 97 mg/dL (ref 70–99)
Glucose-Capillary: 107 mg/dL — ABNORMAL HIGH (ref 70–99)

## 2019-09-10 LAB — COMPREHENSIVE METABOLIC PANEL
ALT: 6 U/L (ref 0–44)
AST: 35 U/L (ref 15–41)
Albumin: 1.9 g/dL — ABNORMAL LOW (ref 3.5–5.0)
Alkaline Phosphatase: 73 U/L (ref 38–126)
Anion gap: 13 (ref 5–15)
BUN: 44 mg/dL — ABNORMAL HIGH (ref 8–23)
CO2: 26 mmol/L (ref 22–32)
Calcium: 8.5 mg/dL — ABNORMAL LOW (ref 8.9–10.3)
Chloride: 95 mmol/L — ABNORMAL LOW (ref 98–111)
Creatinine, Ser: 3.18 mg/dL — ABNORMAL HIGH (ref 0.61–1.24)
GFR calc Af Amer: 22 mL/min — ABNORMAL LOW (ref >60)
GFR calc non Af Amer: 19 mL/min — ABNORMAL LOW (ref >60)
Glucose, Bld: 112 mg/dL — ABNORMAL HIGH (ref 70–99)
Potassium: 3.4 mmol/L — ABNORMAL LOW (ref 3.5–5.1)
Sodium: 134 mmol/L — ABNORMAL LOW (ref 135–145)
Total Bilirubin: 0.4 mg/dL (ref 0.3–1.2)
Total Protein: 6.6 g/dL (ref 6.5–8.1)

## 2019-09-10 LAB — CBC WITH DIFFERENTIAL/PLATELET
Abs Immature Granulocytes: 0.27 10*3/uL — ABNORMAL HIGH (ref 0.00–0.07)
Abs Immature Granulocytes: 0.41 10*3/uL — ABNORMAL HIGH (ref 0.00–0.07)
Basophils Absolute: 0 10*3/uL (ref 0.0–0.1)
Basophils Absolute: 0 10*3/uL (ref 0.0–0.1)
Basophils Relative: 0 %
Basophils Relative: 0 %
Eosinophils Absolute: 3.5 10*3/uL — ABNORMAL HIGH (ref 0.0–0.5)
Eosinophils Absolute: 2.5 10*3/uL — ABNORMAL HIGH (ref 0.0–0.5)
Eosinophils Relative: 24 %
Eosinophils Relative: 16 %
HCT: 21.6 % — ABNORMAL LOW (ref 39.0–52.0)
HCT: 26 % — ABNORMAL LOW (ref 39.0–52.0)
Hemoglobin: 6.9 g/dL — CL (ref 13.0–17.0)
Hemoglobin: 8.5 g/dL — ABNORMAL LOW (ref 13.0–17.0)
Immature Granulocytes: 2 %
Immature Granulocytes: 3 %
Lymphocytes Relative: 7 %
Lymphocytes Relative: 7 %
Lymphs Abs: 0.9 10*3/uL (ref 0.7–4.0)
Lymphs Abs: 1 10*3/uL (ref 0.7–4.0)
MCH: 29.4 pg (ref 26.0–34.0)
MCH: 29.2 pg (ref 26.0–34.0)
MCHC: 31.9 g/dL (ref 30.0–36.0)
MCHC: 32.7 g/dL (ref 30.0–36.0)
MCV: 91.9 fL (ref 80.0–100.0)
MCV: 89.3 fL (ref 80.0–100.0)
Monocytes Absolute: 0.5 10*3/uL (ref 0.1–1.0)
Monocytes Absolute: 0.6 10*3/uL (ref 0.1–1.0)
Monocytes Relative: 3 %
Monocytes Relative: 4 %
Neutro Abs: 9 10*3/uL — ABNORMAL HIGH (ref 1.7–7.7)
Neutro Abs: 10.6 10*3/uL — ABNORMAL HIGH (ref 1.7–7.7)
Neutrophils Relative %: 64 %
Neutrophils Relative %: 70 %
Platelets: 233 10*3/uL (ref 150–400)
Platelets: 241 10*3/uL (ref 150–400)
RBC: 2.35 MIL/uL — ABNORMAL LOW (ref 4.22–5.81)
RBC: 2.91 MIL/uL — ABNORMAL LOW (ref 4.22–5.81)
RDW: 14.3 % (ref 11.5–15.5)
RDW: 14.1 % (ref 11.5–15.5)
WBC: 14.3 10*3/uL — ABNORMAL HIGH (ref 4.0–10.5)
WBC: 15.1 10*3/uL — ABNORMAL HIGH (ref 4.0–10.5)
nRBC: 0 % (ref 0.0–0.2)
nRBC: 0.1 % (ref 0.0–0.2)

## 2019-09-10 LAB — BPAM RBC
Blood Product Expiration Date: 202012222359
ISSUE DATE / TIME: 202011171501
Unit Type and Rh: 5100

## 2019-09-10 MED ORDER — SODIUM CHLORIDE 0.9 % IV SOLN
125.0000 mg | Freq: Every day | INTRAVENOUS | Status: AC
  Administered 2019-09-10 – 2019-09-14 (×5): 125 mg via INTRAVENOUS
  Filled 2019-09-10 (×6): qty 10

## 2019-09-10 MED ORDER — RESOURCE THICKENUP CLEAR PO POWD
ORAL | Status: DC | PRN
  Filled 2019-09-10: qty 125

## 2019-09-10 MED ORDER — CHLORHEXIDINE GLUCONATE CLOTH 2 % EX PADS
6.0000 | MEDICATED_PAD | Freq: Every day | CUTANEOUS | Status: DC
  Administered 2019-09-14 – 2019-09-17 (×3): 6 via TOPICAL

## 2019-09-10 NOTE — Progress Notes (Signed)
Physical Therapy Treatment Patient Details Name: [name] MRN: [id number] DOB: Jul 10, 1951 Today's Date: 09/10/2019    History of Present Illness 68 y/o male diagnosed with COVID on 9/29 presented to the [facility] ED on 10/2 with dyspnea, hypoxemia requiring intubation.  Admitted to [facility], developed AKI requiring CRRT, pneumothorax requiring chest tube and had a large bronchopleural fistula treated for 10 days with a bronchial blocker.  Tracheostomy placed on 10/30. Tested COVID neg x2 on 11/2. MRI on 11/4 showed There is cortical restricted diffusion posteriorly in the right frontal lobe consistent with acute infarct. Additional smaller regions of diffusion abnormality are present in the posterior left frontal lobe, anterior left frontal lobe, and right greater than left medial parietal lobes as well as right cerebellum.     PT Comments    Pt continues to make good progress towards his goals, and is a very Scientist, research (physical sciences) as such PT is changing d/c recommendation to CIR and has placed a consult. PT also has requested tilt bed for improved weightbearing through L side. Pt has activation of L UE and limited L LE activation. Pt is able to turn head and gaze across midline today. PT/OT with work in weightbearing, and strengthening, also provided to passive stretch of posterior muscles scapular, trapezius and cervical muscles. PT will continue to follow acutely.      Follow Up Recommendations  CIR     Equipment Recommendations  Other (comment)(TBD at next venue)       Precautions / Restrictions Precautions Precautions: Fall;Other (comment)(chest tube; coretrack) Required Braces or Orthoses: Other Brace(B prevalon boots) Restrictions Weight Bearing Restrictions: No    Mobility  Bed Mobility Overal bed mobility: Needs Assistance Bed Mobility: Rolling;Sidelying to Sit;Sit to Sidelying Rolling: Max assist;+2 for physical assistance Sidelying to sit: Total assist;+2 for physical assistance        General bed mobility comments: assisted flexion of knees and Roll to R side, pt with assist through R UE to push up   Transfers                 General transfer comment: will need Maximove      Balance Overall balance assessment: Needs assistance Sitting-balance support: Feet supported Sitting balance-Leahy Scale: Poor Sitting balance - Comments: continues to have increased use of trunk and cervical muscles working on R lateral trunk muscle to pull trunk into upright from L lateral lean, able to hold head up for a few seconds before fatigue                                    Cognition Arousal/Alertness: Awake/alert Behavior During Therapy: Flat affect Overall Cognitive Status: Impaired/Different from baseline Area of Impairment: Attention;Safety/judgement;Following commands;Awareness;Problem solving                   Current Attention Level: Sustained   Following Commands: Follows one step commands with increased time Safety/Judgement: Decreased awareness of safety;Decreased awareness of deficits Awareness: Emergent Problem Solving: Slow processing;Decreased initiation;Difficulty sequencing;Requires verbal cues General Comments: pt with more use fo PMSV today, increased command follow       Exercises Other Exercises Other Exercises: cervical AROM and passive stress in L rotation and L lateral cervical and trapezius stretch, PROM of L UE/LE, AAROM of R UE/LE, PROM/AAROM in scapular rotation L and R  Other Exercises: in sitting leaning R/L using repetition to improve movement patterns. Pt able to push with  RUE. Increased head/neck extension; prefers R cervical rotation    General Comments General comments (skin integrity, edema, etc.): requested order for Tilt bed, SaO2 on 40%FiO2 in low 90s with one time dip to 88%O2 with quick recovery with readjustment of oxygen on trach collar      Pertinent Vitals/Pain Pain Assessment: No/denies pain            PT Goals (current goals can now be found in the care plan section) Acute Rehab PT Goals Patient Stated Goal: to get better PT Goal Formulation: With family Time For Goal Achievement: 09/09/19 Potential to Achieve Goals: Fair    Frequency    Min 3X/week      PT Plan Discharge plan needs to be updated    Co-evaluation PT/OT/SLP Co-Evaluation/Treatment: Yes Reason for Co-Treatment: Complexity of the patient's impairments (multi-system involvement);For patient/therapist safety          AM-PAC PT "6 Clicks" Mobility   Outcome Measure  Help needed turning from your back to your side while in a flat bed without using bedrails?: Total Help needed moving from lying on your back to sitting on the side of a flat bed without using bedrails?: Total Help needed moving to and from a bed to a chair (including a wheelchair)?: Total Help needed standing up from a chair using your arms (e.g., wheelchair or bedside chair)?: Total Help needed to walk in hospital room?: Total Help needed climbing 3-5 steps with a railing? : Total 6 Click Score: 6    End of Session Equipment Utilized During Treatment: Oxygen Activity Tolerance: Patient limited by lethargy Patient left: in bed;with call bell/phone within reach;with bed alarm set;with family/visitor present Nurse Communication: Mobility status PT Visit Diagnosis: Unsteadiness on feet (R26.81);Other abnormalities of gait and mobility (R26.89);Muscle weakness (generalized) (M62.81);Difficulty in walking, not elsewhere classified (R26.2)     Time: 9622-2979 PT Time Calculation (min) (ACUTE ONLY): 42 min  Charges:  $Therapeutic Exercise: 8-22 mins                     Doni Widmer B. Migdalia Dk PT, DPT Acute Rehabilitation Services Pager [phone] Office [phone]    [place] 09/10/2019, 12:39 PM

## 2019-09-10 NOTE — Progress Notes (Signed)
Rehab Admissions Coordinator Note:  Per PT and OT recommendation, this Patient was screened by Raechel Ache for appropriateness for an Inpatient Acute Rehab Consult.  At this time, it does not appear that the patient is quite ready for an intensive CIR progarm. Will follow along for greater tolerance during therapy sessions prior to placing consult order for further assessment of candidacy.   Raechel Ache 09/10/2019, 1:37 PM  I can be reached at [phone].

## 2019-09-10 NOTE — Consult Note (Signed)
   [facility] CM Inpatient Consult   09/10/2019  [name] 05-13-1951 [id number]   Follow up: LLOS high risk in [facility] HMO in [place] notes briefly reviewed for transition of care needs updates for progress.  Progress notes patient is being recommended for CIR for transition with [facility] approval.  No acute transition needs for [facility] Care Management assessed if this is the disposition verses SNF.  Natividad Brood, RN BSN [facility] Liaison  [phone] business mobile phone Toll free office [phone]  Fax number: [phone] [email or web address]

## 2019-09-10 NOTE — TOC Progression Note (Addendum)
Transition of Care [facility]) - Progression Note    Patient Details  Name: [name] MRN: [id number] Date of Birth: [birth year]  Transition of [facility]) CM/SW Contact  Senora Lacson, Abelino Derrick, RN Phone Number: 09/10/2019, 2:25 PM  Clinical Narrative:   Pt has now transferred to 2w - no LTACH referral given either verbally nor written with this CM (CM requested consideration on yesterday 11/17) .  Other two discharge options of CIR and SNF both need pt clipped before they can admit pt.   Pt has required HD since 10/15 and does not currently have perm cath - renal has not yet reached out to renal coordinator for clipping.  Renal Coordinator contacted by CM to see if pt may be able to get outpt HD and to see if local outpt HD can accept trach pt.  CM informed that barriers for clipping a trach pt include;  Pt would have to be capped,  Pt could not require any suctioning,  Pt would have to be able to sit up.  CM text paged PCCM to discuss barriers listed above and to request understanding of pt's trach course. Pt also has NG tube which will serve as a barrier for SNF placement.    Update:  CM spoke with Dr Einar Grad from [facility] referral not appropriate at this time as pt only needs trach to manage secretions, it is the expectation that pt should be able to manage his secretions soon.  CM communicated barriers with outpt HD related to trach - PCCM to reassess pt to determine if pt can meet restrictions with trach in outpt HD setting.       Expected Discharge Plan: Long Term Acute Care (LTAC) Barriers to Discharge: Continued Medical Work up  Expected Discharge Plan and Services Expected Discharge Plan: [place] (LTAC)                                               Social Determinants of Health (SDOH) Interventions    Readmission Risk Interventions No flowsheet data found.

## 2019-09-10 NOTE — Progress Notes (Signed)
NAME:  [name], MRN:  [id number], DOB:  [birth year], LOS: 43 ADMISSION DATE:  07/25/2019, CONSULTATION DATE:  10/3 REFERRING MD:  Nadara Mustard, CHIEF COMPLAINT:  Dyspnea   Brief History   68 y/o male diagnosed with COVID on 9/29 presented to the [facility] ED on 10/2 with dyspnea, hypoxemia requiring intubation.  Admitted to [facility], developed AKI requiring CRRT, pneumothorax requiring chest tube and had a large bronchopleural fistula treated for 10 days with a bronchial blocker.  Tracheostomy placed on 10/30.  To [facility] 11/1 for IHD.  Chest tube to suction, improvement in air leak.  Waterseal 11/13  Past Medical History  GERD  Significant Hospital Events   10/03 Admit to [facility] from [facility] ER, start decadron and remdesivir, given tociluzimab; prone positioning 10/04 convalescent plasma 10/05 stop prone positioning 10/06 convalescent plasma; prone positioning again 10/09 start ABx 10/10 MSSA bacteremia, CVL d/ced; ID consulted; increase OG tube outpt 10/11 vent weaning trial started 10/13 fever, pneumothorax, pig tail chest tube placed 10/14 worsening hypotension and renal fx >> consulted nephrology; worsening PTX >> replaced chest tube; GNR in sputum >> ABx changed 10/15 start CRRT; endobronchial blocker placed for persistent air leak 10/16 endobronchial blocker repositioned, changed chest tube to water seal; melana with ABLA >> GI consulted; transfuse PRBC 10/17 persistent air leak, increased WOB; start nimbex gtt >> air leak decreased; EGD; A fib with RVR >> start amiodarone 10/18 transfuse PRBC; GI s/o 10/20 resume heparin gtt 10/21 stopped nimbex, chest tube to suction, stopped heparin drip because Hgb dropped again 10/22 TPA in chest tube, repositioned endobronchial blocker 10/23 deflated endobronchial balloon 10/24 small air leak  10/25 removed bronchial blocker 10/30 tracheostomy  11/1 D/C CRRT 11/2 Transfer to [facility] 11/3 Started on IHD 11/7 trach sutures removed 11/4 present, weaning on ATC  11/11 required phenylephrine to tolerate iHD 11/13 chest tube to waterseal  Consults:  ID Nephrology Gastroenterology PCCM  Procedures:  10/2 R IJ CVL >> 10/10 10/13 Rt pig tail chest tube >> 10/14 10/14 R 63F chest tube >> 10/14 L IJ HD cath >> 11/10 10/30 tracheostomy >>  11/10 tunneled HD cath >>  Significant Diagnostic Tests:  10/2 CT angiogram chest >> extensive bilateral airspace disease predominantly posterior 10/8 doppler legs b/l >> no DVT 10/15 renal u/s >> normal 10/17 EGD >> non bleeding gastric ulcer 10/19 Echo >> EF 60 to 65%, hyperdynamic LV with small LVOT gradient 10/22 CT chest >> large collection of air in pleural space with fluid, pneumonia bilaterally R>L endobronchial blocker in place, chest tube anterior 10/31 CXR >> persistent small right pneumothorax despite chest in adequate position MRI brain 11/3 >> small acute to subacute infarcts involving bilateral cerebral cortex and right cerebellum.  Large b/l mastoid effusions  Micro Data:  9/20 SARS COV 2 >> POSITIVE 10/2 blood >> negative 10/9 blood >> MSSA 2/4 10/9 resp >> MSSA, Pseudomonas 10/13 blood >> negative 10/21 bronch wash bacterial >> pseduomonas, acinetobacter baumannii 10/21 bronch wash fungal >  10/21 bronch wash aspergillus antigen> negative 10/24 bronchoscopy wash> pseudomonas 10/28 Blood cx>>> negative  10/28 Sputum cx>>> Pseudomonas 11/1 SARS COV 2 > negative 11/2 SARS CoV2 >> negative   Antimicrobials:  10/3 remdesivir > 10/6 10/3 actemra 10/3 decadron > 10/12 10/4 convalescent plasma 10/6 convalescent plasma  10/9 vancomycin > 10/10 10/9 cefepime > 10/10 10/10 ancef > 10/13 10/13 cefepime >10/20 10/14 anidulofungin > 10/15  10/20 ancef > 10/23 10/23 meropenem > >>>off  10/21 anidulofungin> 10/22 10/22 voriconazole > 10/23  10/28 Merrem >>11/2 10/28 Vanc >>10/30  10/31 Ancef >> Tentative stop date 11/23   Interim history/subjective:  Tolerating Passy-Muir  valve  Objective   Blood pressure (!) 142/85, pulse 87, temperature 98.6 F (37 C), temperature source Axillary, resp. rate (!) 23, height 5\' 10"  (1.778 m), weight 85.6 kg, SpO2 96 %.    FiO2 (%):  [35 %-40 %] 40 %   Intake/Output Summary (Last 24 hours) at 09/10/2019 1151 Last data filed at 09/10/2019 0500 Gross per 24 hour  Intake 32.6 ml  Output 2552 ml  Net -2519.4 ml   Filed Weights   09/09/19 0600 09/09/19 1140 09/09/19 1546  Weight: 87.3 kg 88.1 kg 85.6 kg    Examination: General: Overweight male no acute distress HEENT: #6 cuffless trach in place with copious thin dry secretions Neuro: Grossly intact CV: Heart sounds are distant PULM: Coarse rhonchi GI: soft, bsx4 active  Extremities: warm/dry, 1+ edema  Skin: no rashes or lesions    Assessment & Plan:   Acute respiratory failure with hypoxemia - 2/2 COVID-19 (s/p remdsevir, actemra, convalescent plasma, steroids)  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  Now with persistent airleak / BPF despite endobronchial blocker placement (removed 10/25 due to MSSA bacteremia) - off MV since 11/2 P: Continue trach collar 28% Currently wearing Passy-Muir valve Trach downsized 09/07/2021 6 cuffless 6 Pulmonary critical care will follow once a week   ESRD now on iHD P: Per nephrology  Bilateral cerebral cortex and right cerebellum small acute to subacute infarcts Neuromuscular weakness/encephalopathy Persistent shock/vasoplegia -improved P: Per primary  MSSA bacteremia -TTE negative.  P: Cefazolin for 6 weeks total stop date 1123  Anemia of chronic disease with GI bleed and gastric ulcer  Recent Labs    09/09/19 1130 09/10/19 0431  HGB 6.9* 8.5*    P:  Transfuse per protocol   Rest per primary team   Best practice:  Diet: tube feeding Pain/Anxiety/Delirium protocol (if indicated):  VAP protocol (if indicated): yes DVT prophylaxis: subcutaneous heparin GI  prophylaxis: Pantoprazole for stress ulcer prophylaxis Glucose control: SSI Mobility: PT  Code Status: DNR if arrests Family Communication: wife updated at bedside  Disposition: PCU    Richardson Landry Minor ACNP Maryanna Shape PCCM '

## 2019-09-10 NOTE — Plan of Care (Signed)
  Problem: Education: Goal: Knowledge of risk factors and measures for prevention of condition will improve Outcome: Progressing   Problem: Respiratory: Goal: Will maintain a patent airway Outcome: Progressing Goal: Complications related to the disease process, condition or treatment will be avoided or minimized Outcome: Progressing   Problem: Education: Goal: Knowledge of General Education information will improve Description: Including pain rating scale, medication(s)/side effects and non-pharmacologic comfort measures Outcome: Progressing   Problem: Health Behavior/Discharge Planning: Goal: Ability to manage health-related needs will improve Outcome: Progressing   Problem: Clinical Measurements: Goal: Ability to maintain clinical measurements within normal limits will improve Outcome: Progressing Goal: Will remain free from infection Outcome: Progressing Goal: Diagnostic test results will improve Outcome: Progressing Goal: Respiratory complications will improve Outcome: Progressing Goal: Cardiovascular complication will be avoided Outcome: Progressing   Problem: Activity: Goal: Risk for activity intolerance will decrease Outcome: Progressing   Problem: Nutrition: Goal: Adequate nutrition will be maintained Outcome: Progressing   Problem: Coping: Goal: Level of anxiety will decrease Outcome: Progressing   Problem: Elimination: Goal: Will not experience complications related to bowel motility Outcome: Progressing Goal: Will not experience complications related to urinary retention Outcome: Progressing   Problem: Pain Managment: Goal: General experience of comfort will improve Outcome: Progressing   Problem: Safety: Goal: Ability to remain free from injury will improve Outcome: Progressing   Problem: Skin Integrity: Goal: Risk for impaired skin integrity will decrease Outcome: Progressing   Problem: Intracerebral Hemorrhage Tissue Perfusion: Goal:  Complications of Intracerebral Hemorrhage will be minimized Outcome: Progressing

## 2019-09-10 NOTE — Evaluation (Signed)
Clinical/Bedside Swallow Evaluation Patient Details  Name: Donato Studley MRN: [id number] Date of Birth: 14-Jan-1951  Today's Date: 09/10/2019 Time: SLP Start Time (ACUTE ONLY): 0900 SLP Stop Time (ACUTE ONLY): 0910 SLP Time Calculation (min) (ACUTE ONLY): 10 min  Past Medical History:  Past Medical History:  Diagnosis Date  . COVID-19   . Heartburn    Past Surgical History:  Past Surgical History:  Procedure Laterality Date  . ACHILLES TENDON SURGERY Right 2012  . COLONOSCOPY  2009   [facility]  . ESOPHAGOGASTRODUODENOSCOPY N/A 12/25/2014   Procedure: ESOPHAGOGASTRODUODENOSCOPY (EGD);  Surgeon: Danie Binder, MD;  Location: [facility];  Service: Endoscopy;  Laterality: N/A;  830am  . ESOPHAGOGASTRODUODENOSCOPY N/A 08/09/2019   Procedure: ESOPHAGOGASTRODUODENOSCOPY (EGD);  Surgeon: Ronald Lobo, MD;  Location: [facility];  Service: Endoscopy;  Laterality: N/A;  Patient is already sedated, so I do not anticipate need for further sedation.  Okay from my standpoint to do either at the bedside or in the operating room  . HEMOSTASIS CLIP PLACEMENT  08/09/2019   Procedure: HEMOSTASIS CLIP PLACEMENT;  Surgeon: Ronald Lobo, MD;  Location: [facility];  Service: Endoscopy;;  . HEMOSTASIS CONTROL  08/09/2019   Procedure: HEMOSTASIS CONTROL;  Surgeon: Ronald Lobo, MD;  Location: [facility] ENDOSCOPY;  Service: Endoscopy;;  epi   . IR FLUORO GUIDE CV LINE LEFT  09/02/2019  . IR US GUIDE VASC ACCESS LEFT  09/02/2019  . KNEE SURGERY Left 1980's  . none    . WRIST SURGERY Left 1970's   HPI:  68 year old with a history of GERD and BPH who presented to [facility], ED with S OB and was found to be hypoxic.  He was initially diagnosed with COVID-19 September 20.  1 to 2 days prior to his presentation he developed worsening shortness of breath with anorexia. Admitted on 10/3, intubated.  EGD on 10/17 showed normal esophagus but excessive gastric fluid. CT scan of the chest on 10/21 showed findings  consistent with an empyema as well as persistent pneumothorax.  Bedside bronchoscopy revealed material in the airway. Trached on 10/30. On ATC by 10/31.Additionally MRI on 11/4 showed There is cortical restricted diffusion posteriorly in the right frontal lobe consistent with acute infarct. Additional smaller regions of diffusion abnormality are present in the posterior left frontal lobe, anterior left frontal lobe, and right greater than left medial parietal lobes as well as right cerebellum.    Assessment / Plan / Recommendation Clinical Impression  Pt participated in brief swallow eval; he was experieincing discomfort at flexiseal site and was not interested in PO intake at the time. However, pt did demosntrate excellent potential for PO intake with no observable signs of difficutly with ice chip trials and sips of water. He is capable of participating in [facility] for objective assessment of swallowing and potential diet initiation. Will proceed with this today.  SLP Visit Diagnosis: Dysphagia, unspecified (R13.10)    Aspiration Risk  Mild aspiration risk    Diet Recommendation          Other  Recommendations     Follow up Recommendations Skilled Nursing facility      Frequency and Duration            Prognosis        Swallow Study   General HPI: 68 year old with a history of GERD and BPH who presented to [facility], ED with S OB and was found to be hypoxic.  He was initially diagnosed with COVID-19 September 20.  1 to  2 days prior to his presentation he developed worsening shortness of breath with anorexia. Admitted on 10/3, intubated.  EGD on 10/17 showed normal esophagus but excessive gastric fluid. CT scan of the chest on 10/21 showed findings consistent with an empyema as well as persistent pneumothorax.  Bedside bronchoscopy revealed material in the airway. Trached on 10/30. On ATC by 10/31.Additionally MRI on 11/4 showed There is cortical restricted diffusion posteriorly in the  right frontal lobe consistent with acute infarct. Additional smaller regions of diffusion abnormality are present in the posterior left frontal lobe, anterior left frontal lobe, and right greater than left medial parietal lobes as well as right cerebellum.  Type of Study: Bedside Swallow Evaluation Previous Swallow Assessment: none Diet Prior to this Study: NPO;NG Tube Temperature Spikes Noted: No Respiratory Status: Trach Collar Trach Size and Type: #8;Uncuffed Behavior/Cognition: Alert;Cooperative;Pleasant mood Oral Cavity Assessment: Within Functional Limits Oral Care Completed by SLP: No Oral Cavity - Dentition: Adequate natural dentition Self-Feeding Abilities: Total assist Patient Positioning: Upright in bed Baseline Vocal Quality: Normal Volitional Cough: Strong Volitional Swallow: Able to elicit    Oral/Motor/Sensory Function Overall Oral Motor/Sensory Function: Mild impairment Facial ROM: Within Functional Limits Facial Symmetry: Within Functional Limits Facial Strength: Within Functional Limits Facial Sensation: Within Functional Limits Lingual ROM: Reduced right;Suspected CN XII (hypoglossal) dysfunction Lingual Strength: Within Functional Limits Lingual Sensation: Within Functional Limits Velum: Within Functional Limits   Ice Chips Ice chips: Within functional limits Presentation: Spoon   Thin Liquid Thin Liquid: Within functional limits Presentation: Straw    Nectar Thick Nectar Thick Liquid: Not tested   Honey Thick Honey Thick Liquid: Not tested   Puree Puree: Not tested(pt refused)   Solid     Solid: Not tested     [name], MA [place] Pager [phone] Office [phone]  Lynann Beaver 09/10/2019,9:37 AM

## 2019-09-10 NOTE — Progress Notes (Signed)
Occupational Therapy Treatment Patient Details Name: [name] MRN: [id number] DOB: [birth year] Today's Date: 09/10/2019    History of present illness 68 y/o male diagnosed with COVID on 9/29 presented to the [facility] ED on 10/2 with dyspnea, hypoxemia requiring intubation.  Admitted to [facility], developed AKI requiring CRRT, pneumothorax requiring chest tube and had a large bronchopleural fistula treated for 10 days with a bronchial blocker.  Tracheostomy placed on 10/30. Tested COVID neg x2 on 11/2. MRI on 11/4 showed There is cortical restricted diffusion posteriorly in the right frontal lobe consistent with acute infarct. Additional smaller regions of diffusion abnormality are present in the posterior left frontal lobe, anterior left frontal lobe, and right greater than left medial parietal lobes as well as right cerebellum.    OT comments  Pt continues to progress toward established OT goals, pt continues to demonstrate great motivation to participate with OT/PT. Pt demonstrates improvements with turning his head to cross midline and attend to left side. Pt required maxA+2 for stability sitting EOB, worked on weightbearing through L and R side. Pt required support at elbow and wrist to wash his face. Pt will continue to benefit from skilled OT services to maximize safety and independence with ADL/IADL and functional mobility. Will continue to follow acutely and progress as tolerated.    Follow Up Recommendations  Supervision/Assistance - 24 hour;CIR    Equipment Recommendations  Wheelchair cushion (measurements OT);Wheelchair (measurements OT);Hospital bed    Recommendations for Other Services      Precautions / Restrictions Precautions Precautions: Fall;Other (comment)(chest tube; coretrack) Required Braces or Orthoses: Other Brace(B prevalon boots) Restrictions Weight Bearing Restrictions: No       Mobility Bed Mobility Overal bed mobility: Needs Assistance Bed Mobility:  Rolling;Sidelying to Sit;Sit to Sidelying Rolling: Max assist;+2 for physical assistance Sidelying to sit: Total assist;+2 for physical assistance       General bed mobility comments: assisted flexion of knees and Roll to R side, pt with assist through R UE to push up   Transfers                 General transfer comment: will need Maximove    Balance Overall balance assessment: Needs assistance Sitting-balance support: Feet supported Sitting balance-Leahy Scale: Poor Sitting balance - Comments: continues to have increased use of trunk and cervical muscles working on R lateral trunk muscle to pull trunk into upright from L lateral lean, able to hold head up for a few seconds before fatigue                                   ADL either performed or assessed with clinical judgement   ADL Overall ADL's : Needs assistance/impaired Eating/Feeding: NPO   Grooming: Maximal assistance;Oral care;Wash/dry face                               Functional mobility during ADLs: Total assistance;+2 for physical assistance;Maximal assistance General ADL Comments: totalA for all ADL, hand over hand and support at elbow to wash face;pt able to tolerate sitting EOB for 29min with maxA for stability     Vision   Vision Assessment?: Yes Eye Alignment: Impaired (comment) Ocular Range of Motion: Restricted looking up Alignment/Gaze Preference: Head turned(R gaze preference, able to scan into L field ) Tracking/Visual Pursuits: Decreased smoothness of horizontal tracking;Decreased smoothness of vertical tracking Saccades: Impaired -  to be further tested in functional context Visual Fields: Impaired-to be further tested in functional context;Left visual field deficit Additional Comments: pt made eye contact for 3 seconds x3 occurrances;unable to maintain   Perception     Praxis      Cognition Arousal/Alertness: Awake/alert Behavior During Therapy: Flat  affect Overall Cognitive Status: Impaired/Different from baseline Area of Impairment: Attention;Safety/judgement;Following commands;Awareness;Problem solving                   Current Attention Level: Sustained   Following Commands: Follows one step commands with increased time Safety/Judgement: Decreased awareness of safety;Decreased awareness of deficits Awareness: Emergent Problem Solving: Slow processing;Decreased initiation;Difficulty sequencing;Requires verbal cues General Comments: pt with more use fo PMSV today, increased command follow         Exercises Exercises: Other exercises Other Exercises Other Exercises: cervical AROM and passive stress in L rotation and L lateral cervical and trapezius stretch, PROM of L UE/LE, AAROM of R UE/LE, PROM/AAROM in scapular rotation L and R  Other Exercises: in sitting leaning R/L using repetition to improve movement patterns. Pt able to push with RUE. Increased head/neck extension; prefers R cervical rotation   Shoulder Instructions       General Comments requested order for tilt bed;SpO2 low 90s with 88%x1 on 40% FiO2, quick recovery with readjustment of oxygen on trach collar    Pertinent Vitals/ Pain       Pain Assessment: No/denies pain  Home Living                                          Prior Functioning/Environment              Frequency  Min 2X/week        Progress Toward Goals  OT Goals(current goals can now be found in the care plan section)  Progress towards OT goals: Progressing toward goals  Acute Rehab OT Goals Patient Stated Goal: to get better OT Goal Formulation: With patient Time For Goal Achievement: 09/23/19 Potential to Achieve Goals: Good ADL Goals Pt Will Perform Grooming: with mod assist;sitting;bed level Additional ADL Goal #1: Pt will perform bed mobility with Mod A in preparation for ADLs Additional ADL Goal #2: Pt will tolerate sitting at EOB for 10 minutes  with Min A Additional ADL Goal #3: Pt will sustain attention to simple ADL with Mod cues  Plan Discharge plan remains appropriate    Co-evaluation    PT/OT/SLP Co-Evaluation/Treatment: Yes Reason for Co-Treatment: Complexity of the patient's impairments (multi-system involvement);For patient/therapist safety;To address functional/ADL transfers   OT goals addressed during session: ADL's and self-care      AM-PAC OT "6 Clicks" Daily Activity     Outcome Measure   Help from another person eating meals?: Total Help from another person taking care of personal grooming?: A Lot Help from another person toileting, which includes using toliet, bedpan, or urinal?: Total Help from another person bathing (including washing, rinsing, drying)?: Total Help from another person to put on and taking off regular upper body clothing?: Total Help from another person to put on and taking off regular lower body clothing?: Total 6 Click Score: 7    End of Session Equipment Utilized During Treatment: Oxygen  OT Visit Diagnosis: Unsteadiness on feet (R26.81);Other abnormalities of gait and mobility (R26.89);Muscle weakness (generalized) (M62.81)   Activity Tolerance Patient tolerated treatment well  Patient Left in bed;with call bell/phone within reach;with bed alarm set   Nurse Communication Mobility status        Time: 8891-6945 OT Time Calculation (min): 45 min  Charges: OT General Charges $OT Visit: 1 Visit OT Treatments $Self Care/Home Management : 23-37 mins  [place] Office: [place] 09/10/2019, 1:19 PM

## 2019-09-10 NOTE — Progress Notes (Signed)
Request for permanent dialysis access.  Vein mapping pending.  I will follow up once vein mapping complete.  Annamarie Major

## 2019-09-10 NOTE — Progress Notes (Addendum)
PROGRESS NOTE   [name]  FGH:[id number]    DOB: [birth year]    DOA: 07/25/2019  PCP: Kathyrn Drown, MD   I have briefly reviewed patients previous medical records in [facility]  Chief Complaint  Patient presents with  . Shortness of Breath    Brief Narrative:  68 year old male with PMH of GERD and BPH, initially presented on 07/25/2019 with worsening dyspnea after recently testing positive for COVID-19 on 07/22/2019.  He required emergent intubation at [facility] ED.  He was admitted to the [facility] hospital for acute hypoxic respiratory failure secondary to severe ARDS from Covid pneumonia and completed Decadron, Remdesivir and Tocilizumab.  Hospital course complicated by acute kidney injury requiring CRRT, encephalopathy, pneumothorax complicated by persistent air leak and bronchopleural fistula requiring endobronchial blocker and pleur-evac, prolonged mechanical ventilation s/p tracheostomy on 10/30, MSSA bacteremia with negative echocardiogram intermittent pressor support with dialysis, melena with acute blood loss anemia requiring transfusion, IV PPI, empiric octreotide, EGD showed gastric ulcer and nonbleeding visible vessel clipped, transferred to [facility] on 08/26/2023 intermittent HD, subacute right cerebral/cerebellar infarcts on MRI.  PCCM following for tracheostomy management, weekly.  Nephrology on board for HD needs.  VVS will consult for permanent dialysis access.  Assessment & Plan:   Principal Problem:   Pneumonia due to COVID-19 virus Active Problems:   BPH (benign prostatic hyperplasia)   Acute respiratory failure due to COVID-19 (HCC)   AKI (acute kidney injury) (HCC)   Fluid overload   Pneumothorax on right   Acute renal failure (ARF) (HCC)   GI bleed   Atrial fibrillation with RVR (HCC)   Constipation   Gastric ulcer with hemorrhage   Acute respiratory failure (HCC)   Chest tube in place   Primary spontaneous pneumothorax   Acute  respiratory distress syndrome (ARDS) due to COVID-19 virus ([place])   Tracheostomy status ([place])   Cerebral thrombosis with cerebral infarction   Advanced care planning/counseling discussion   Goals of care, counseling/discussion   Palliative care by specialist   Sinus tachycardia   Acute blood loss anemia   H. pylori infection   CVA (cerebral vascular accident) ([place])   Anoxic brain injury ([place])   MSSA bacteremia   Positive D dimer   Pseudomonas infection   Infection due to acinetobacter baumannii   Acute hypoxic respiratory failure secondary to severe ARDS from Covid pneumonia.  Completed remdesivir/Decadron/Actemra/convalescent plasma therapy.  ARDS resolved.  Covid testing negative x2 on 11/1 and 11/2.  Stable on trach collar, management per PCCM.  Continue trach collar at 28%.  Using Passy-Muir valve.  Tracheostomy downsized 09/07/2021.  PCCM to follow weekly or as needed.  S/p chest tube due to pneumothorax, removed 09/08/2019.  TCTS signed off 11/17.  ESRD  Started off as acute kidney injury complicating stage III CKD.  Has now progressed to ESRD.  Nephrology continues to assist.  ESRD presumed as he has been HD dependent for over a month without signs of renal recovery.  Ongoing TTS schedule, next dialysis 11/19.  Currently dialyzed through TTS, vascular surgery consulted for permanent access and will see after vein mapping.  Septic shock, resolved  Right pneumothorax/empyema/necrotic right lower lobe/bronchopleural fistula with large air leak  Chest tube removed by CT surgery on 11/16.  Periodic chest x-ray follow-up.  Appears stable for now.  Acute on chronic anemia  Secondary to gastric ulcer, s/p EGD with culprit vessel clipping.  S/p PRBC transfusion 1 unit on 11/13.  Hemoglobin had dropped  again to 6.9 on 11/17.  Transfused second unit PRBC on 11/17.  Hemoglobin up to 8.5.  H. pylori positive by EGD biopsy   GI recommends waiting until clinically  stable after discharge and starting 2 weeks regimen to prevent ulcer recurrence. GI (per Dr. Cristina Gong 10/27) to follow up in 2-3 months from hospitalization to arrange this and possible f/u EGD.   CVA  Small bilateral cerebral/right cerebellum infarcts 11/3 likely due to hypotension during dialysis, sepsis and deconditioning.  PAF is also potential etiology.  Mental status continues to gradually improve.  Has residual left hemiplegia, dysphagia.  Neurology recommends aspirin 325 mg daily, permissive hypertension.  No anticoagulation given history of GI bleed.  Follow-up in 4 weeks after discharge at stroke clinic NP/GNA.  Continue therapies evaluation and will need to determine disposition i.e. CIR versus SNF versus LTAC.   Dysphagia  Has right nasal core track.  Speech therapy input appreciated, advancing diet.  Hopefully can tolerate enough to DC core track at some point soon.  Otherwise may need to consider PEG.  Anoxic brain injury  As per neurology MRI brain 11/3 consistent with anoxic brain injury.  Atrial fibrillation, paroxysmal  Currently in sinus rhythm.  Briefly required amiodarone earlier in the hospitalization.  Continue aspirin.  Not anticoagulation candidate due to GI bleed.  MSSA bacteremia  TTE negative but still high concern for infective endocarditis and hence continue cefazolin until end date of 11/23 i.e. total of 6 weeks.  Pseudomonas and Acinetobacter in BAL  Resolved.  Completed 10 days course of meropenem.  Elevated D-dimer  No full anticoagulation with GI bleed.  Dopplers negative for DVT on 10/8.  Likely related to acute illness/acute infectious etiology.  Pressure Injury 08/24/19 Sacrum Right;Left;Mid Stage II -  Partial thickness loss of dermis presenting as a shallow open ulcer with a red, pink wound bed without slough.  (Active)  08/24/19 0800  Location: Sacrum  Location Orientation: Right;Left;Mid  Staging: Stage II -  Partial thickness  loss of dermis presenting as a shallow open ulcer with a red, pink wound bed without slough.  Wound Description (Comments): -- (stage 11 mid sacrum)  Present on Admission: No    Goals of care Guarded prognosis.  Family wish to pursue full scope of treatment.  Palliative medicine assisted with GOC.  Diarrhea Unclear etiology.  Not much stool in the Flexi-Seal.  Monitor closely and consider discontinuing Flexi-Seal if no significant diarrhea.   Body mass index is 27.08 kg/m.  Nutritional Status Nutrition Problem: Inadequate oral intake Etiology: inability to eat(pt sedated and ventilated) Signs/Symptoms: NPO status Interventions: Tube feeding  DVT prophylaxis: SCDs Code Status: DNR Family Communication: None at bedside Disposition: To be determined.?  CIR versus SNF versus LTAC.   Consultants:  ID, nephrology, GI, neurology PCCM, Palliative  Procedures:  Chest tube removed, 11/16 Tunneled HD cath, 11/10 HD, 11/3 Tracheostomy 10/30 EGD 10/17. Gastric ulcer, nonbleeding visible vessel clipped CRRT 10/15 Bronchoscopy with endobronchial ballon, 10/15, repeat 10/16 Right-sided chest tube, 10/14 Right pigtail chest tube--for right PTX 10/13-2/14 Right IJ 10/2-10/10 ETT 10/2  Antimicrobials:  None.   Subjective: Patient interviewed and examined along with RN in room.  Patient having difficulty speaking/expressing himself.  Asking to use the bathroom but has a Flexi-Seal.  Otherwise states that he feels "okay".  Denies pain or complaints  Objective:  Vitals:   09/10/19 1145 09/10/19 1148 09/10/19 1200 09/10/19 1545  BP: (!) 142/85     Pulse: 88 87  99  Resp: (!) 21 (!) 23 20 (!) 24  Temp: 98.6 F (37 C)     TempSrc: Axillary     SpO2: 96% 96%  100%  Weight:      Height:        Examination:  General exam: Pleasant middle-age male, moderately built and nourished lying comfortably propped up in bed without distress.  Has tracheostomy with minimal thick greenish  secretion.  Trach collar present. Respiratory system: Diminished breath sounds in the bases but otherwise clear to auscultation.  No increased work of breathing. Cardiovascular system: S1 & S2 heard, RRR. No JVD, murmurs, rubs, gallops or clicks. No pedal edema.  Has left upper extremity dependent edema.  Trace left lower extremity edema.  Telemetry personally reviewed: Sinus rhythm. Gastrointestinal system: Abdomen is nondistended, soft and nontender. No organomegaly or masses felt. Normal bowel sounds heard. Central nervous system: Alert and oriented to self and place.  Appears to have aphasia and some dysarthria Extremities: Left-sided hemiplegia with grade 0 x 5 power.  Right limbs at least 2 x 5 power. Skin: No rashes, lesions or ulcers Psychiatry: Judgement and insight appear impaired. Mood & affect appropriate.     Data Reviewed: I have personally reviewed following labs and imaging studies   CBC: Recent Labs  Lab 09/06/19 0951 09/07/19 0321 09/08/19 0421 09/09/19 1130 09/10/19 0431  WBC 15.6* 14.4* 13.9* 14.3* 15.1*  NEUTROABS  --   --   --  9.0* 10.6*  HGB 8.0* 8.5* 7.6* 6.9* 8.5*  HCT 24.6* 26.7* 24.0* 21.6* 26.0*  MCV 89.8 90.5 90.6 91.9 89.3  PLT 296 277 264 233 643    Basic Metabolic Panel: Recent Labs  Lab 09/04/19 0730 09/05/19 0922 09/07/19 0321 09/09/19 1130 09/09/19 1145 09/10/19 0431  NA 135 134* 137 133*  --  134*  K 4.5 3.9 4.1 3.2*  --  3.4*  CL 94* 93* 95* 92*  --  95*  CO2 _0 --  26  GLUCOSE 103* 100* 98 110*  --  112*  BUN 112* 80* 41* 106*  --  44*  CREATININE 5.76* 4.44* 2.92* 5.91*  --  3.18*  CALCIUM 8.7* 8.7* 8.4* 8.5*  --  8.5*  MG  --   --   --  2.6* 2.4  --   PHOS  --   --  3.5 4.6  --   --     Liver Function Tests: Recent Labs  Lab 09/07/19 0321 09/09/19 1130 09/10/19 0431  AST  --  37 35  ALT  --  7 6  ALKPHOS  --  96 73  BILITOT  --  0.6 0.4  PROT  --  6.1* 6.6  ALBUMIN 1.5* 1.5* 1.9*    CBG: Recent Labs   Lab 09/09/19 1914 09/09/19 2320 09/10/19 0349 09/10/19 0735 09/10/19 1148  GLUCAP 111* 84 96 94 101*    No results found for this or any previous visit (from the past 240 hour(s)).    Radiology Studies: Dg Chest Port 1 View  Result Date: 09/09/2019 CLINICAL DATA:  Shortness of breath. Pneumothorax on right. Encounter for chest tube removal. EXAM: PORTABLE CHEST 1 VIEW COMPARISON:  Radiograph yesterday. FINDINGS: Tracheostomy tube tip at the thoracic inlet. Weighted enteric tube tip below the diaphragm not included in the field of view. Left internal jugular catheter tip in the SVC. Unchanged small right basal lateral pneumothorax. Right lower lobe pulmonary pneumatocele again seen. Unchanged heart size and mediastinal contours. Slight  worsening and mild diffuse pulmonary opacity, atelectasis versus edema. IMPRESSION: 1. Unchanged small right basal lateral pneumothorax. 2. Slight worsening diffuse pulmonary opacity, atelectasis versus edema. 3. Tracheostomy tube tip at the thoracic inlet. Electronically Signed   By: Keith Rake M.D.   On: 09/09/2019 06:26          Scheduled Meds: . sodium chloride   Intravenous Once  . aspirin  325 mg Per Tube Daily  . atorvastatin  40 mg Oral q1800  . B-complex with vitamin C  1 tablet Per Tube Daily  . chlorhexidine  15 mL Mouth/Throat BID  . Chlorhexidine Gluconate Cloth  6 each Topical Daily  . Chlorhexidine Gluconate Cloth  6 each Topical Q0600  . [START ON 09/11/2019] Chlorhexidine Gluconate Cloth  6 each Topical Q0600  . [START ON 09/11/2019] darbepoetin (ARANESP) injection - DIALYSIS  100 mcg Intravenous Q Thu-HD  . feeding supplement (PRO-STAT SUGAR FREE 64)  30 mL Per Tube QID  . Gerhardt's butt cream   Topical TID  . heparin injection (subcutaneous)  5,000 Units Subcutaneous Q8H  . mouth rinse  15 mL Mouth Rinse 10 times per day  . pantoprazole sodium  40 mg Per Tube Daily  . sucralfate  1 g Oral Q6H   Continuous Infusions:  . sodium chloride Stopped (09/01/19 0000)  .  ceFAZolin (ANCEF) IV 100 mL/hr at 09/10/19 0500  . dextrose Stopped (08/16/19 1623)  . feeding supplement (NEPRO CARB STEADY) 1,000 mL (09/10/19 0029)  . ferric gluconate (FERRLECIT/NULECIT) IV 125 mg (09/10/19 1328)     LOS: 103 days     Vernell Leep, MD, [place], [facility]. [facility]  To contact the attending provider between 7A-7P or the covering provider during after hours 7P-7A, please log into the web site [email or web address] and access using universal  password for that web site. If you do not have the password, please call the hospital operator.  09/10/2019, 4:08 PM

## 2019-09-10 NOTE — Progress Notes (Signed)
  Speech Language Pathology Treatment: Dysphagia  Patient Details Name: [name] MRN: [id number] DOB: 08-Oct-1951 Today's Date: 09/10/2019 Time: 0932-3557 SLP Time Calculation (min) (ACUTE ONLY): 24 min  Assessment / Plan / Recommendation Clinical Impression  Pt alert following all commands, immediately able to clearly phonate, communicate at conversation level with PMSV in place. Oriented to place and situation (though was not aware he had initially had COVID-19) but not date. Pt able to state his phone number and participated in an excellent purposeful and pragmatically appropriate conversation with wife [name] over the phone. Tolerating PMSV without incident, but he needs supervision due to ongoing secretions and inability to remove PMSV independently in case of emergency. Confirmed with wife that she is placing his valve. Pt was also able to turn head and sustain visual contact with speaker on the left with min verbal and contextual cues. See next note for swallow eval.   HPI HPI: 68 year old with a history of GERD and BPH who presented to [facility], ED with S OB and was found to be hypoxic.  He was initially diagnosed with COVID-19 September 20.  1 to 2 days prior to his presentation he developed worsening shortness of breath with anorexia. Admitted on 10/3, intubated.  EGD on 10/17 showed normal esophagus but excessive gastric fluid. CT scan of the chest on 10/21 showed findings consistent with an empyema as well as persistent pneumothorax.  Bedside bronchoscopy revealed material in the airway. Trached on 10/30. On ATC by 10/31.Additionally MRI on 11/4 showed There is cortical restricted diffusion posteriorly in the right frontal lobe consistent with acute infarct. Additional smaller regions of diffusion abnormality are present in the posterior left frontal lobe, anterior left frontal lobe, and right greater than left medial parietal lobes as well as right cerebellum.       SLP Plan  Continue  with current plan of care       Recommendations         Patient may use Passy-Muir Speech Valve: Intermittently with supervision;During all therapies with supervision         Follow up Recommendations: Skilled Nursing facility SLP Visit Diagnosis: Aphonia (R49.1) Plan: Continue with current plan of care       GO                Mykale Gandolfo, Katherene Ponto 09/10/2019, 9:27 AM

## 2019-09-10 NOTE — Progress Notes (Signed)
Subjective:  Moved to 2W- had HD yest-  Able to remove 2550 with albumin for BP support-  Also gave a unit of blood for hgb 6.9- communicating better today-  Can talk even with trach -  understands what I am saying  Objective Vital signs in last 24 hours: Vitals:   09/09/19 1900 09/09/19 2259 09/09/19 2330 09/10/19 0736  BP: (!) 149/89 (!) 143/90  (!) 145/88  Pulse:   94 94  Resp:   19 18  Temp: 98.6 F (37 C) 98.4 F (36.9 C)  98.5 F (36.9 C)  TempSrc: Axillary Axillary  Oral  SpO2:   100% 100%  Weight:      Height:       Weight change: 0.8 kg  Intake/Output Summary (Last 24 hours) at 09/10/2019 1660 Last data filed at 09/10/2019 0500 Gross per 24 hour  Intake 347.6 ml  Output 2552 ml  Net -2204.4 ml    Assessment/ Plan: Pt is a 68 y.o. yo male with CKD who was admitted on 07/25/2019 with COVID pna-  Has progressed to being HD req since 10/15  Assessment/Plan: 1. Pulm-  Chest tube out.  Still with trach-  Being downsized- no abx-  Per CCM 2. ESRD- presumed as has been HD dep for over a month without any current signs of renal recovery- pre HD BUN and crt of 106 and 5.9.  Have been doing HD on a TTS schedule here, due tomorrow-  Has TDC.  Need to start thinking about a perm AV access-  Patient understands the issue- is willing to talk to VVS 3. Anemia- has been low- also with GIB- no hep with HD- on ESA-   iron stores low, will replete as well- supportive care  - transfused one unit on 11/17 4. Secondary hyperparathyroidism- phos is OK - no binders 5. HTN/volume- no scheduled BP meds- trying to challenge volume with success  6. Dispo-  Cannot do OP HD with trach, also is extremely debilitated, not sure he can sit up for OP HD either-  Has LTAC been considered ?   Louis Meckel    Labs: Basic Metabolic Panel: Recent Labs  Lab 09/07/19 0321 09/09/19 1130 09/10/19 0431  NA 137 133* 134*  K 4.1 3.2* 3.4*  CL 95* 92* 95*  CO2 26 26 26   GLUCOSE 98 110* 112*  BUN  41* 106* 44*  CREATININE 2.92* 5.91* 3.18*  CALCIUM 8.4* 8.5* 8.5*  PHOS 3.5 4.6  --    Liver Function Tests: Recent Labs  Lab 09/07/19 0321 09/09/19 1130 09/10/19 0431  AST  --  37 35  ALT  --  7 6  ALKPHOS  --  96 73  BILITOT  --  0.6 0.4  PROT  --  6.1* 6.6  ALBUMIN 1.5* 1.5* 1.9*   No results for input(s): LIPASE, AMYLASE in the last 168 hours. No results for input(s): AMMONIA in the last 168 hours. CBC: Recent Labs  Lab 09/06/19 0951 09/07/19 0321 09/08/19 0421 09/09/19 1130 09/10/19 0431  WBC 15.6* 14.4* 13.9* 14.3* 15.1*  NEUTROABS  --   --   --  9.0* 10.6*  HGB 8.0* 8.5* 7.6* 6.9* 8.5*  HCT 24.6* 26.7* 24.0* 21.6* 26.0*  MCV 89.8 90.5 90.6 91.9 89.3  PLT 296 277 264 233 241   Cardiac Enzymes: No results for input(s): CKTOTAL, CKMB, CKMBINDEX, TROPONINI in the last 168 hours. CBG: Recent Labs  Lab 09/09/19 1829 09/09/19 1914 09/09/19 2320 09/10/19 0349 09/10/19  [place] 96 94    Iron Studies:  Recent Labs    09/09/19 1145  IRON 26*  TIBC 178*  FERRITIN 879*   Studies/Results: Dg Chest 1v Repeat Same Day  Result Date: 09/08/2019 CLINICAL DATA:  Status post chest tube removal. History of right-sided pneumothorax. EXAM: CHEST - 1 VIEW SAME DAY COMPARISON:  One-view chest x-ray 09/08/2019 at 4:20 a.m. FINDINGS: The right-sided chest tube is been removed. Tiny lateral pneumothorax is stable. Tracheostomy tube is in place. Left IJ line is stable. Feeding tube courses off the inferior of the film. Mild pulmonary vascular congestion is present. Bilateral effusions are present. Bibasilar airspace disease is stable. IMPRESSION: 1. Stable tiny right lateral pneumothorax. 2. Stable bibasilar airspace disease and effusions. 3. Stable mild pulmonary vascular congestion. Electronically Signed   By: San Morelle M.D.   On: 09/08/2019 14:26   Dg Chest Port 1 View  Result Date: 09/09/2019 CLINICAL DATA:  Shortness of breath. Pneumothorax  on right. Encounter for chest tube removal. EXAM: PORTABLE CHEST 1 VIEW COMPARISON:  Radiograph yesterday. FINDINGS: Tracheostomy tube tip at the thoracic inlet. Weighted enteric tube tip below the diaphragm not included in the field of view. Left internal jugular catheter tip in the SVC. Unchanged small right basal lateral pneumothorax. Right lower lobe pulmonary pneumatocele again seen. Unchanged heart size and mediastinal contours. Slight worsening and mild diffuse pulmonary opacity, atelectasis versus edema. IMPRESSION: 1. Unchanged small right basal lateral pneumothorax. 2. Slight worsening diffuse pulmonary opacity, atelectasis versus edema. 3. Tracheostomy tube tip at the thoracic inlet. Electronically Signed   By: Keith Rake M.D.   On: 09/09/2019 06:26   Medications: Infusions: . sodium chloride Stopped (09/01/19 0000)  .  ceFAZolin (ANCEF) IV 100 mL/hr at 09/10/19 0500  . dextrose Stopped (08/16/19 1623)  . feeding supplement (NEPRO CARB STEADY) 1,000 mL (09/10/19 0029)    Scheduled Medications: . sodium chloride   Intravenous Once  . aspirin  325 mg Per Tube Daily  . atorvastatin  40 mg Oral q1800  . B-complex with vitamin C  1 tablet Per Tube Daily  . chlorhexidine  15 mL Mouth/Throat BID  . Chlorhexidine Gluconate Cloth  6 each Topical Daily  . Chlorhexidine Gluconate Cloth  6 each Topical Q0600  . [START ON 09/11/2019] darbepoetin (ARANESP) injection - DIALYSIS  100 mcg Intravenous Q Thu-HD  . feeding supplement (PRO-STAT SUGAR FREE 64)  30 mL Per Tube QID  . Gerhardt's butt cream   Topical TID  . heparin injection (subcutaneous)  5,000 Units Subcutaneous Q8H  . mouth rinse  15 mL Mouth Rinse 10 times per day  . pantoprazole sodium  40 mg Per Tube Daily  . sucralfate  1 g Oral Q6H    have reviewed scheduled and prn medications.  Physical Exam: General: seems more alert-  Verbalizing his needs  Heart: RRR Lungs: mostly clear Abdomen: soft, non tender Extremities:  pitting edema Dialysis Access: left TDC     09/10/2019,8:07 AM  LOS: 47 days

## 2019-09-10 NOTE — Progress Notes (Signed)
Modified Barium Swallow Progress Note  Patient Details  Name: [name] MRN: [id number] Date of Birth: November 05, 1950  Today's Date: 09/10/2019  Modified Barium Swallow completed.  Full report located under Chart Review in the Imaging Section.  Brief recommendations include the following:  Clinical Impression  Pt demonstrates a mild oral dysphagia, mostly secondary to very poor positioning during exam. Pt was extremely forward leaning throughout with total assist needed for minimally upright head position. This complicated posterior propulsion, with prolonged formation, but there was adequate labial seal and eventual oral clearance even if two swallows were needed. Pharyngeal iniaition was slightly late at valleculae, but no penetration occurred during testing with thin or nectar. Concerned that a posterior oriented head position in bed could result in higher risk of penetration, so nectar thick liquids are recommended initially, as well as purees. He may also have small controlled sips of plain water or ice with staff or family if desired. Pt is able to masticate slowly but adequately and as he progresses can trial advanced solids with SLP. Recommend attempting a few meals and if pt tolerates well could consider NG tube removal.    Swallow Evaluation Recommendations       SLP Diet Recommendations: Nectar thick liquid;Thin liquid;Dysphagia 1 (Puree) solids   Liquid Administration via: Cup;Straw   Medication Administration: Crushed with puree   Supervision: Full assist for feeding;Full supervision/cueing for compensatory strategies   Compensations: Slow rate;Small sips/bites   Postural Changes: Remain semi-upright after after feeds/meals (Comment);Seated upright at 90 degrees   Oral Care Recommendations: Oral care BID   Other Recommendations: Have oral suction available   [name], MA [place] Pager [phone] Office [phone]  Perrin Eddleman,  Katherene Ponto 09/10/2019,11:59 AM

## 2019-09-11 ENCOUNTER — Inpatient Hospital Stay (HOSPITAL_COMMUNITY): Payer: Medicare HMO

## 2019-09-11 DIAGNOSIS — N186 End stage renal disease: Secondary | ICD-10-CM

## 2019-09-11 DIAGNOSIS — Z992 Dependence on renal dialysis: Secondary | ICD-10-CM | POA: Diagnosis not present

## 2019-09-11 DIAGNOSIS — J9601 Acute respiratory failure with hypoxia: Secondary | ICD-10-CM | POA: Diagnosis not present

## 2019-09-11 LAB — GLUCOSE, CAPILLARY
Glucose-Capillary: 106 mg/dL — ABNORMAL HIGH (ref 70–99)
Glucose-Capillary: 112 mg/dL — ABNORMAL HIGH (ref 70–99)
Glucose-Capillary: 98 mg/dL (ref 70–99)

## 2019-09-11 LAB — FUNGUS CULTURE WITH STAIN

## 2019-09-11 LAB — FUNGUS CULTURE RESULT

## 2019-09-11 LAB — FUNGAL ORGANISM REFLEX

## 2019-09-11 MED ORDER — GUAIFENESIN-DM 100-10 MG/5ML PO SYRP
5.0000 mL | ORAL_SOLUTION | ORAL | Status: DC | PRN
  Administered 2019-09-11 – 2019-09-30 (×27): 5 mL
  Filled 2019-09-11 (×29): qty 5

## 2019-09-11 MED ORDER — DARBEPOETIN ALFA 100 MCG/0.5ML IJ SOSY
PREFILLED_SYRINGE | INTRAMUSCULAR | Status: AC
  Filled 2019-09-11: qty 0.5

## 2019-09-11 MED ORDER — HEPARIN SODIUM (PORCINE) 1000 UNIT/ML IJ SOLN
INTRAMUSCULAR | Status: AC
  Filled 2019-09-11: qty 4

## 2019-09-11 NOTE — Plan of Care (Signed)
  Problem: Education: Goal: Knowledge of risk factors and measures for prevention of condition will improve Outcome: Progressing   Problem: Respiratory: Goal: Will maintain a patent airway Outcome: Progressing Goal: Complications related to the disease process, condition or treatment will be avoided or minimized Outcome: Progressing   Problem: Education: Goal: Knowledge of General Education information will improve Description: Including pain rating scale, medication(s)/side effects and non-pharmacologic comfort measures Outcome: Progressing   Problem: Health Behavior/Discharge Planning: Goal: Ability to manage health-related needs will improve Outcome: Progressing   Problem: Clinical Measurements: Goal: Ability to maintain clinical measurements within normal limits will improve Outcome: Progressing Goal: Will remain free from infection Outcome: Progressing Goal: Diagnostic test results will improve Outcome: Progressing Goal: Respiratory complications will improve Outcome: Progressing Goal: Cardiovascular complication will be avoided Outcome: Progressing   Problem: Activity: Goal: Risk for activity intolerance will decrease Outcome: Progressing   Problem: Nutrition: Goal: Adequate nutrition will be maintained Outcome: Progressing   Problem: Coping: Goal: Level of anxiety will decrease Outcome: Progressing   Problem: Elimination: Goal: Will not experience complications related to bowel motility Outcome: Progressing Goal: Will not experience complications related to urinary retention Outcome: Progressing   Problem: Pain Managment: Goal: General experience of comfort will improve Outcome: Progressing   Problem: Safety: Goal: Ability to remain free from injury will improve Outcome: Progressing   Problem: Skin Integrity: Goal: Risk for impaired skin integrity will decrease Outcome: Progressing   Problem: Intracerebral Hemorrhage Tissue Perfusion: Goal:  Complications of Intracerebral Hemorrhage will be minimized Outcome: Progressing

## 2019-09-11 NOTE — Progress Notes (Signed)
RT called to dialysis to assess patient for coughing. Upon RT arrival patient was hooked up to SAT probe and leads for the monitor but the monitor was on standby. RT turned the monitor on & got a SAT reading of 85% on room air. PMV was removed and a NRB (due to not having a trach setup or venti mask trach set up in dialysis) was placed over the patient's trach with improved SAT's of 100%. RT suctioned patient and got a small amount of thick tan secretions. Inner cannula was cleaned and dried & placed back into trach. RT then placed patient on a 40% venti mask trach set up with 10L O2. RN was made aware of all of these events & that patient's PMV was on the bedside table. RN was also made aware to transport the patient back to his room after completion of dialysis with FIO2 set at 40% on venti mask setup and 10L of O2 on the tank. Patient stated that he felt much better.

## 2019-09-11 NOTE — Progress Notes (Signed)
  Speech Language Pathology Treatment: Dysphagia  Patient Details Name: [name] MRN: [id number] DOB: 04-19-1951 Today's Date: 09/11/2019 Time: 8329-1916 SLP Time Calculation (min) (ACUTE ONLY): 16 min  Assessment / Plan / Recommendation Clinical Impression  SLP assisted pt with part of breakfast meal. Positioning is somewhat challenging in the bed, but with use of pillows for support and reverse Trendelenburg he achieves an adequate posture. PMV was placed for meal without incidence. Pt had an occasional, delayed cough that seemed to be reduced when alternating between solids and liquids. These liquid washes also helped to clear his oral cavity. Recommend continuing Dys 1 diet and nectar thick liquids for now with full supervision and assistance during meals.    HPI HPI: 68 year old with a history of GERD and BPH who presented to [facility], ED with S OB and was found to be hypoxic.  He was initially diagnosed with COVID-19 September 20.  1 to 2 days prior to his presentation he developed worsening shortness of breath with anorexia. Admitted on 10/3, intubated.  EGD on 10/17 showed normal esophagus but excessive gastric fluid. CT scan of the chest on 10/21 showed findings consistent with an empyema as well as persistent pneumothorax.  Bedside bronchoscopy revealed material in the airway. Trached on 10/30. On ATC by 10/31.Additionally MRI on 11/4 showed There is cortical restricted diffusion posteriorly in the right frontal lobe consistent with acute infarct. Additional smaller regions of diffusion abnormality are present in the posterior left frontal lobe, anterior left frontal lobe, and right greater than left medial parietal lobes as well as right cerebellum.       SLP Plan  Continue with current plan of care       Recommendations  Diet recommendations: Dysphagia 1 (puree);Nectar-thick liquid Liquids provided via: Cup;Straw Medication Administration: Crushed with puree Supervision:  Staff to assist with self feeding;Full supervision/cueing for compensatory strategies Compensations: Slow rate;Small sips/bites Postural Changes and/or Swallow Maneuvers: Seated upright 90 degrees;Upright 30-60 min after meal      Patient may use Passy-Muir Speech Valve: Intermittently with supervision;During all therapies with supervision;During PO intake/meals PMSV Supervision: Full MD: Please consider changing trach tube to : Smaller size         Oral Care Recommendations: Oral care QID Follow up Recommendations: Inpatient Rehab SLP Visit Diagnosis: Dysphagia, oropharyngeal phase (R13.12) Plan: Continue with current plan of care       GO                Venita Sheffield Blayne Garlick 09/11/2019, 8:40 AM  Pollyann Glen, M.A. [place] Acute Environmental education officer [phone] Office [phone]

## 2019-09-11 NOTE — Progress Notes (Signed)
Vein mapping has been completed.  The patient would be a candidate for a left basilic vein fistula.  However, with his Covid status as well as his debilitation, I do not believe that this is the best time to perform fistula creation.  Please reconsult [place] when the patient is more stable for fistula creation.  This was discussed with Dr. Ronalee Red

## 2019-09-11 NOTE — Progress Notes (Signed)
Subjective:  No significant change overnight- due for HD today -  Does not have cal bell-  Wanting to be turned -  Has zero bed mobility    Objective Vital signs in last 24 hours: Vitals:   09/10/19 2318 09/10/19 2331 09/11/19 0302 09/11/19 0310  BP: (!) 142/90   (!) 153/89  Pulse: 95 (!) 103 (!) 115 (!) 111  Resp: (!) 25 (!) 25 (!) 25 (!) 25  Temp: 98.5 F (36.9 C)   98.9 F (37.2 C)  TempSrc: Axillary   Axillary  SpO2: 99% 99% 98% 96%  Weight:      Height:       Weight change:   Intake/Output Summary (Last 24 hours) at 09/11/2019 9390 Last data filed at 09/10/2019 1500 Gross per 24 hour  Intake 115.38 ml  Output -  Net 115.38 ml    Assessment/ Plan: Pt is a 68 y.o. yo male with CKD who was admitted on 07/25/2019 with COVID pna-  Has progressed to being HD req since 10/15  Assessment/Plan: 1. Pulm-  Chest tube out.  Still with trach-  Being downsized- no abx-  Per CCM 2. ESRD- presumed as has been HD dep for over a month without any current signs of renal recovery- pre HD BUN and crt of 106 and 5.9.  Have been doing HD on a TTS schedule here, due today-  Has TDC.  Vein mapping pending-  For VVS to see re perm access - thank you  3. Anemia- has been low- also with GIB- no hep with HD- on ESA-   iron stores low, will replete as well- supportive care  - transfused one unit on 11/17 4. Secondary hyperparathyroidism- phos is OK - no binders 5. HTN/volume- no scheduled BP meds- trying to challenge volume with success  6. Dispo-  Cannot do OP HD with trach, also is extremely debilitated- cannot turn in bed, dont think he can sit up for OP HD - being told that LTAC is not appropriate due to trach but maybe due to this issue of debility- not able to do OP HD    [place]: Basic Metabolic Panel: Recent Labs  Lab 09/07/19 0321 09/09/19 1130 09/10/19 0431  NA 137 133* 134*  K 4.1 3.2* 3.4*  CL 95* 92* 95*  CO2 26 26 26   GLUCOSE 98 110* 112*  BUN 41* 106*  44*  CREATININE 2.92* 5.91* 3.18*  CALCIUM 8.4* 8.5* 8.5*  PHOS 3.5 4.6  --    Liver Function Tests: Recent Labs  Lab 09/07/19 0321 09/09/19 1130 09/10/19 0431  AST  --  37 35  ALT  --  7 6  ALKPHOS  --  96 73  BILITOT  --  0.6 0.4  PROT  --  6.1* 6.6  ALBUMIN 1.5* 1.5* 1.9*   No results for input(s): LIPASE, AMYLASE in the last 168 hours. No results for input(s): AMMONIA in the last 168 hours. CBC: Recent Labs  Lab 09/06/19 0951 09/07/19 0321 09/08/19 0421 09/09/19 1130 09/10/19 0431  WBC 15.6* 14.4* 13.9* 14.3* 15.1*  NEUTROABS  --   --   --  9.0* 10.6*  HGB 8.0* 8.5* 7.6* 6.9* 8.5*  HCT 24.6* 26.7* 24.0* 21.6* 26.0*  MCV 89.8 90.5 90.6 91.9 89.3  PLT 296 277 264 233 241   Cardiac Enzymes: No results for input(s): CKTOTAL, CKMB, CKMBINDEX, TROPONINI in the last 168 hours. CBG: Recent Labs  Lab 09/10/19 1148 09/10/19 1650  09/10/19 2240 09/11/19 0342 09/11/19 0749  GLUCAP 101* 97 107* 106* 112*    Iron Studies:  Recent Labs    09/09/19 1145  IRON 26*  TIBC 178*  FERRITIN 879*   Studies/Results: No results found. Medications: Infusions: . sodium chloride Stopped (09/01/19 0000)  .  ceFAZolin (ANCEF) IV 1 g (09/10/19 2056)  . dextrose Stopped (08/16/19 1623)  . feeding supplement (NEPRO CARB STEADY) 1,000 mL (09/11/19 0639)  . ferric gluconate (FERRLECIT/NULECIT) IV 125 mg (09/10/19 1328)    Scheduled Medications: . aspirin  325 mg Per Tube Daily  . atorvastatin  40 mg Oral q1800  . B-complex with vitamin C  1 tablet Per Tube Daily  . chlorhexidine  15 mL Mouth/Throat BID  . Chlorhexidine Gluconate Cloth  6 each Topical Daily  . Chlorhexidine Gluconate Cloth  6 each Topical Q0600  . Chlorhexidine Gluconate Cloth  6 each Topical Q0600  . darbepoetin (ARANESP) injection - DIALYSIS  100 mcg Intravenous Q Thu-HD  . feeding supplement (PRO-STAT SUGAR FREE 64)  30 mL Per Tube QID  . Gerhardt's butt cream   Topical TID  . heparin injection  (subcutaneous)  5,000 Units Subcutaneous Q8H  . mouth rinse  15 mL Mouth Rinse 10 times per day  . pantoprazole sodium  40 mg Per Tube Daily  . sucralfate  1 g Oral Q6H    have reviewed scheduled and prn medications.  Physical Exam: General: seems more alert-  Verbalizing his needs but physically very limited Heart: RRR Lungs: mostly clear Abdomen: soft, non tender Extremities: pitting edema but better-  bilat boots  Dialysis Access: left TDC     09/11/2019,8:07 AM  LOS: 48 days

## 2019-09-11 NOTE — Progress Notes (Signed)
PROGRESS NOTE   [name]  PPI:[id number]    DOB: 07-05-1951    DOA: 07/25/2019  PCP: Kathyrn Drown, MD   I have briefly reviewed patients previous medical records in [facility].  Chief Complaint  Patient presents with  . Shortness of Breath    Brief Narrative:  68 year old male with PMH of GERD and BPH, initially presented on 07/25/2019 with worsening dyspnea after recently testing positive for COVID-19 on 07/22/2019.  He required emergent intubation at [facility] ED.  He was admitted to the [facility] hospital for acute hypoxic respiratory failure secondary to severe ARDS from Covid pneumonia and completed Decadron, Remdesivir and Tocilizumab.  Hospital course complicated by acute kidney injury requiring CRRT, encephalopathy, pneumothorax complicated by persistent air leak and bronchopleural fistula requiring endobronchial blocker and pleur-evac, prolonged mechanical ventilation s/p tracheostomy on 10/30, MSSA bacteremia with negative echocardiogram intermittent pressor support with dialysis, melena with acute blood loss anemia requiring transfusion, IV PPI, empiric octreotide, EGD showed gastric ulcer and nonbleeding visible vessel clipped, transferred to [facility] on 08/26/2023 intermittent HD, subacute right cerebral/cerebellar infarcts on MRI.  PCCM following for tracheostomy management, weekly.  Nephrology on board for HD needs.  VVS will consult for permanent dialysis access.  Assessment & Plan:   Principal Problem:   Pneumonia due to COVID-19 virus Active Problems:   BPH (benign prostatic hyperplasia)   Acute respiratory failure due to COVID-19 (HCC)   AKI (acute kidney injury) (HCC)   Fluid overload   Pneumothorax on right   Acute renal failure (ARF) (HCC)   GI bleed   Atrial fibrillation with RVR (HCC)   Constipation   Gastric ulcer with hemorrhage   Acute respiratory failure (HCC)   Chest tube in place   Primary spontaneous pneumothorax   Acute  respiratory distress syndrome (ARDS) due to COVID-19 virus ([place])   Tracheostomy status ([place])   Cerebral thrombosis with cerebral infarction   Advanced care planning/counseling discussion   Goals of care, counseling/discussion   Palliative care by specialist   Sinus tachycardia   Acute blood loss anemia   H. pylori infection   CVA (cerebral vascular accident) ([place])   Anoxic brain injury ([place])   MSSA bacteremia   Positive D dimer   Pseudomonas infection   Infection due to acinetobacter baumannii   Acute hypoxic respiratory failure secondary to severe ARDS from Covid pneumonia.  Completed remdesivir/Decadron/Actemra/convalescent plasma therapy.  ARDS resolved.  Covid testing negative x2 on 11/1 and 11/2.  Stable on trach collar, management per PCCM.  Continue trach collar at 28%, briefly increased to 40% at dialysis due to transient hypoxia likely related to secretions.  Using Passy-Muir valve.  Tracheostomy downsized 09/07/2021.  PCCM to follow weekly or as needed.  S/p chest tube due to pneumothorax, removed 09/08/2019.  TCTS signed off 11/17.  ESRD  Started off as acute kidney injury complicating stage III CKD.  Has now progressed to ESRD.  Nephrology continues to assist.  ESRD presumed as he has been HD dependent for over a month without signs of renal recovery.  Ongoing TTS schedule, next dialysis 11/19.  Currently dialyzed through TTS, vascular surgery consulted for permanent access and will see after vein mapping.  I discussed with Dr. Moshe Cipro.  He is not appropriate for SNF because unable to sit for outpatient HD.  Only options would be LTAC or CIR.  Septic shock, resolved  Right pneumothorax/empyema/necrotic right lower lobe/bronchopleural fistula with large air leak  Chest tube removed by  CT surgery on 11/16.  Periodic chest x-ray follow-up.  Appears stable for now.  Chest x-ray 11/17: Unchanged small right basal lateral pneumothorax.  Acute on chronic  anemia  Secondary to gastric ulcer, s/p EGD with culprit vessel clipping.  S/p PRBC transfusion 1 unit on 11/13.  Hemoglobin had dropped again to 6.9 on 11/17.  Transfused second unit PRBC on 11/17.  Hemoglobin up to 8.5 on 11/18.  Periodically follow CBC across HD.  H. pylori positive by EGD biopsy   GI recommends waiting until clinically stable after discharge and starting 2 weeks regimen to prevent ulcer recurrence. GI (per Dr. Cristina Gong 10/27) to follow up in 2-3 months from hospitalization to arrange this and possible f/u EGD.   CVA  Small bilateral cerebral/right cerebellum infarcts 11/3 likely due to hypotension during dialysis, sepsis and deconditioning.  PAF is also potential etiology.  Mental status continues to gradually improve.  Has residual left hemiplegia, dysphagia.  Neurology recommends aspirin 325 mg daily, permissive hypertension.  No anticoagulation given history of GI bleed.  Follow-up in 4 weeks after discharge at stroke clinic NP/[facility].  Continue therapies evaluation and will need to determine disposition i.e. CIR versus LTAC.   Dysphagia  Has right nasal core track.  Speech therapy input appreciated, advancing diet.  Hopefully can tolerate enough to DC core track at some point soon.  Otherwise may need to consider PEG.  Patient seen tolerating dysphagia 1 diet and nectar thickened liquids being fed by speech therapy.  Discussed extensively with speech therapy and patient's RN to document amount of oral intake to determine if and when core track can be removed.  Anoxic brain injury  As per neurology MRI brain 11/3 consistent with anoxic brain injury.  Seems to be coherent and responds to questions appropriately.  Atrial fibrillation, paroxysmal  Currently in sinus rhythm.  Briefly required amiodarone earlier in the hospitalization.  Continue aspirin.  Not anticoagulation candidate due to GI bleed.  MSSA bacteremia  TTE negative but still high concern for  infective endocarditis and hence continue cefazolin until end date of 11/23 i.e. total of 6 weeks.  Pseudomonas and Acinetobacter in BAL  Resolved.  Completed 10 days course of meropenem.  Elevated D-dimer  No full anticoagulation with GI bleed.  Dopplers negative for DVT on 10/8.  Likely related to acute illness/acute infectious etiology.  Pressure Injury 08/24/19 Sacrum Right;Left;Mid Stage II -  Partial thickness loss of dermis presenting as a shallow open ulcer with a red, pink wound bed without slough.  (Active)  08/24/19 0800  Location: Sacrum  Location Orientation: Right;Left;Mid  Staging: Stage II -  Partial thickness loss of dermis presenting as a shallow open ulcer with a red, pink wound bed without slough.  Wound Description (Comments): -- (stage 11 mid sacrum)  Present on Admission: No    Goals of care Guarded prognosis.  Family wish to pursue full scope of treatment.  Palliative medicine assisted with GOC.  Diarrhea Unclear etiology.  Not much stool in the Flexi-Seal.  Monitor closely and consider discontinuing Flexi-Seal if no significant diarrhea. Discussed in detail with patient's RN to document type and volume of stool.  Flexi-Seal reportedly fell off yesterday but patient was oozing loose stools.  If has persistent loose stools, may need to get dietitian to change tube feeds.   Body mass index is 27.08 kg/m.  Nutritional Status Nutrition Problem: Inadequate oral intake Etiology: inability to eat(pt sedated and ventilated) Signs/Symptoms: NPO status Interventions: Tube feeding  DVT prophylaxis:  SCDs Code Status: DNR Family Communication: None at bedside Disposition: To be determined.?  CIR versus LTAC.   Consultants:  ID, nephrology, GI, neurology PCCM, Palliative  Procedures:  Chest tube removed, 11/16 Tunneled HD cath, 11/10 HD, 11/3 Tracheostomy 10/30 EGD 10/17. Gastric ulcer, nonbleeding visible vessel clipped CRRT 10/15 Bronchoscopy with  endobronchial ballon, 10/15, repeat 10/16 Right-sided chest tube, 10/14 Right pigtail chest tube--for right PTX 10/13-2/14 Right IJ 10/2-10/10 ETT 10/2  Antimicrobials:  None.   Subjective: Patient being fed by speech therapy.  Tolerating same.  She then put on his PMV valve and patient able to speak.  Specifically denied complaints.  Objective:  Vitals:   09/11/19 1600 09/11/19 1630 09/11/19 1635 09/11/19 1656  BP: (!) 102/54 (!) 112/54  (!) 127/56  Pulse: (!) 109 (!) 107 (!) 109 (!) 109  Resp: (!) 25 (!) _0 Temp:      TempSrc:      SpO2:   100%   Weight:      Height:        Examination:  General exam: Pleasant middle-age male, moderately built and nourished lying comfortably propped up in bed without distress.  Tracheostomy with PMV valve. Respiratory system: Diminished breath sounds in the bases but otherwise clear to auscultation.  No increased work of breathing.  Stable without change. Cardiovascular system: S1 & S2 heard, RRR. No JVD, murmurs, rubs, gallops or clicks. No pedal edema.  Has left upper extremity dependent edema.  Trace left lower extremity edema.  Telemetry personally reviewed: SR-ST up to 110. Gastrointestinal system: Abdomen is nondistended, soft and nontender. No organomegaly or masses felt. Normal bowel sounds heard. Central nervous system: Alert and oriented, responds appropriately to questions..  Appears to have aphasia and some dysarthria Extremities: Left-sided hemiplegia with grade 0 x 5 power.  Right upper extremity grade 3 x 5 distally, 2 x 5 proximally.  Right lower extremity grade 2 x 5 power. Skin: No rashes, lesions or ulcers Psychiatry: Judgement and insight appear impaired. Mood & affect appropriate.     Data Reviewed: I have personally reviewed following labs and imaging studies   CBC: Recent Labs  Lab 09/06/19 0951 09/07/19 0321 09/08/19 0421 09/09/19 1130 09/10/19 0431  WBC 15.6* 14.4* 13.9* 14.3* 15.1*  NEUTROABS  --    --   --  9.0* 10.6*  HGB 8.0* 8.5* 7.6* 6.9* 8.5*  HCT 24.6* 26.7* 24.0* 21.6* 26.0*  MCV 89.8 90.5 90.6 91.9 89.3  PLT 296 277 264 233 300    Basic Metabolic Panel: Recent Labs  Lab 09/05/19 0922 09/07/19 0321 09/09/19 1130 09/09/19 1145 09/10/19 0431  NA 134* 137 133*  --  134*  K 3.9 4.1 3.2*  --  3.4*  CL 93* 95* 92*  --  95*  CO2 _1 --  26  GLUCOSE 100* 98 110*  --  112*  BUN 80* 41* 106*  --  44*  CREATININE 4.44* 2.92* 5.91*  --  3.18*  CALCIUM 8.7* 8.4* 8.5*  --  8.5*  MG  --   --  2.6* 2.4  --   PHOS  --  3.5 4.6  --   --     Liver Function Tests: Recent Labs  Lab 09/07/19 0321 09/09/19 1130 09/10/19 0431  AST  --  37 35  ALT  --  7 6  ALKPHOS  --  96 73  BILITOT  --  0.6 0.4  PROT  --  6.1* 6.6  ALBUMIN 1.5* 1.5* 1.9*    CBG: Recent Labs  Lab 09/10/19 1650 09/10/19 2240 09/11/19 0342 09/11/19 0749 09/11/19 1214  GLUCAP 97 107* 106* 112* 98    No results found for this or any previous visit (from the past 240 hour(s)).    Radiology Studies: Vas [place] Upper Ext Vein Mapping (pre-op Avf)  Result Date: 09/11/2019 UPPER EXTREMITY VEIN MAPPING  Indications: Pre-access. History: End stage renal disease.  Comparison Study: No prior study Performing Technologist: Maudry Mayhew MHA, RDMS, RVT, RDCS  Examination Guidelines: A complete evaluation includes B-mode imaging, spectral Doppler, color Doppler, and power Doppler as needed of all accessible portions of each vessel. Bilateral testing is considered an integral part of a complete examination. Limited examinations for reoccurring indications may be performed as noted. +-----------------+-------------+----------+--------------+ Right Cephalic   Diameter (cm)Depth (cm)   Findings    +-----------------+-------------+----------+--------------+ Shoulder             0.29        0.93                  +-----------------+-------------+----------+--------------+ Prox upper arm       0.33         0.50                  +-----------------+-------------+----------+--------------+ Mid upper arm        0.32        0.57                  +-----------------+-------------+----------+--------------+ Dist upper arm       0.28        0.44                  +-----------------+-------------+----------+--------------+ Antecubital fossa    0.29        0.18     branching    +-----------------+-------------+----------+--------------+ Prox forearm         0.26        0.32                  +-----------------+-------------+----------+--------------+ Mid forearm          0.31        0.38     branching    +-----------------+-------------+----------+--------------+ Dist forearm         0.12        0.18                  +-----------------+-------------+----------+--------------+ Wrist                                   not visualized +-----------------+-------------+----------+--------------+ +-----------------+-------------+----------+--------------+ Right Basilic    Diameter (cm)Depth (cm)   Findings    +-----------------+-------------+----------+--------------+ Prox upper arm                          not visualized +-----------------+-------------+----------+--------------+ Mid upper arm                           not visualized +-----------------+-------------+----------+--------------+ Dist upper arm                          not visualized +-----------------+-------------+----------+--------------+ Antecubital fossa                       not visualized +-----------------+-------------+----------+--------------+  Prox forearm                            not visualized +-----------------+-------------+----------+--------------+ Mid forearm                             not visualized +-----------------+-------------+----------+--------------+ Distal forearm                          not visualized +-----------------+-------------+----------+--------------+  Wrist                                   not visualized +-----------------+-------------+----------+--------------+ +-----------------+-------------+----------+--------------+ Left Cephalic    Diameter (cm)Depth (cm)   Findings    +-----------------+-------------+----------+--------------+ Shoulder             0.23        0.79                  +-----------------+-------------+----------+--------------+ Prox upper arm       0.23        0.41                  +-----------------+-------------+----------+--------------+ Mid upper arm        0.23        0.64                  +-----------------+-------------+----------+--------------+ Dist upper arm       0.16        0.43                  +-----------------+-------------+----------+--------------+ Antecubital fossa    0.25        0.38                  +-----------------+-------------+----------+--------------+ Prox forearm         0.25        0.27                  +-----------------+-------------+----------+--------------+ Mid forearm          0.26        0.22                  +-----------------+-------------+----------+--------------+ Dist forearm                            not visualized +-----------------+-------------+----------+--------------+ Wrist                                   not visualized +-----------------+-------------+----------+--------------+ +-----------------+-------------+----------+--------------+ Left Basilic     Diameter (cm)Depth (cm)   Findings    +-----------------+-------------+----------+--------------+ Prox upper arm       0.44                              +-----------------+-------------+----------+--------------+ Mid upper arm        0.46                              +-----------------+-------------+----------+--------------+ Dist upper arm       0.38                              +-----------------+-------------+----------+--------------+  Antecubital fossa                        not visualized +-----------------+-------------+----------+--------------+ Prox forearm                            not visualized +-----------------+-------------+----------+--------------+ Mid forearm                             not visualized +-----------------+-------------+----------+--------------+ Distal forearm                          not visualized +-----------------+-------------+----------+--------------+ Wrist                                   not visualized +-----------------+-------------+----------+--------------+ *See table(s) above for measurements and observations.  Diagnosing physician:    Preliminary           Scheduled Meds: . aspirin  325 mg Per Tube Daily  . atorvastatin  40 mg Oral q1800  . B-complex with vitamin C  1 tablet Per Tube Daily  . chlorhexidine  15 mL Mouth/Throat BID  . Chlorhexidine Gluconate Cloth  6 each Topical Daily  . Chlorhexidine Gluconate Cloth  6 each Topical Q0600  . Chlorhexidine Gluconate Cloth  6 each Topical Q0600  . darbepoetin (ARANESP) injection - DIALYSIS  100 mcg Intravenous Q Thu-HD  . feeding supplement (PRO-STAT SUGAR FREE 64)  30 mL Per Tube QID  . Gerhardt's butt cream   Topical TID  . heparin injection (subcutaneous)  5,000 Units Subcutaneous Q8H  . mouth rinse  15 mL Mouth Rinse 10 times per day  . pantoprazole sodium  40 mg Per Tube Daily  . sucralfate  1 g Oral Q6H   Continuous Infusions: . sodium chloride Stopped (09/01/19 0000)  .  ceFAZolin (ANCEF) IV 1 g (09/10/19 2056)  . dextrose Stopped (08/16/19 1623)  . feeding supplement (NEPRO CARB STEADY) 1,000 mL (09/11/19 0639)  . ferric gluconate (FERRLECIT/NULECIT) IV 125 mg (09/11/19 1002)     LOS: 60 days     Vernell Leep, MD, [place]  To contact the attending provider between 7A-7P or the covering provider during after hours 7P-7A, please log into the web site [email or web address] and access using universal  [place] password for that web site. If you do not have the password, please call the hospital operator.  09/11/2019, 5:06 PM

## 2019-09-11 NOTE — Progress Notes (Signed)
Upper extremity vein mapping completed. Refer to "CV Proc" under chart review to view preliminary results.  09/11/2019 9:49 AM Kelby Aline., MHA, RVT, RDCS, RDMS

## 2019-09-12 DIAGNOSIS — J9601 Acute respiratory failure with hypoxia: Secondary | ICD-10-CM | POA: Diagnosis not present

## 2019-09-12 DIAGNOSIS — Z992 Dependence on renal dialysis: Secondary | ICD-10-CM | POA: Diagnosis not present

## 2019-09-12 DIAGNOSIS — N186 End stage renal disease: Secondary | ICD-10-CM | POA: Diagnosis not present

## 2019-09-12 LAB — GLUCOSE, CAPILLARY
Glucose-Capillary: 104 mg/dL — ABNORMAL HIGH (ref 70–99)
Glucose-Capillary: 105 mg/dL — ABNORMAL HIGH (ref 70–99)
Glucose-Capillary: 81 mg/dL (ref 70–99)
Glucose-Capillary: 86 mg/dL (ref 70–99)
Glucose-Capillary: 97 mg/dL (ref 70–99)

## 2019-09-12 MED ORDER — RESOURCE THICKENUP CLEAR PO POWD
ORAL | Status: DC | PRN
  Filled 2019-09-12 (×2): qty 125

## 2019-09-12 MED ORDER — PRO-STAT SUGAR FREE PO LIQD
30.0000 mL | Freq: Three times a day (TID) | ORAL | Status: DC
  Administered 2019-09-12 – 2019-09-15 (×7): 30 mL
  Filled 2019-09-12 (×7): qty 30

## 2019-09-12 MED ORDER — WHITE PETROLATUM EX OINT
TOPICAL_OINTMENT | CUTANEOUS | Status: DC | PRN
  Filled 2019-09-12 (×2): qty 5

## 2019-09-12 MED ORDER — NEPRO/CARBSTEADY PO LIQD
1000.0000 mL | ORAL | Status: DC
  Administered 2019-09-12: 1000 mL via ORAL
  Filled 2019-09-12 (×2): qty 1000

## 2019-09-12 MED ORDER — RENA-VITE PO TABS
1.0000 | ORAL_TABLET | Freq: Every day | ORAL | Status: DC
  Administered 2019-09-12 – 2019-10-02 (×21): 1 via ORAL
  Filled 2019-09-12 (×21): qty 1

## 2019-09-12 NOTE — Progress Notes (Signed)
Renal Navigator contacted by CM/S. [place] to discuss patient's case and need for ongoing HD. Renal Navigator notes that patient has a trach. Patient's trach will need to be well healed, and not requiring suctioning for him to be appropriate for OP HD treatment. His acceptance to an OP HD clinic is still one that must be reviewed by the Medical Director at the time of referral, as these situations are evaluated on a case-by-case basis. He will also need to be able tolerate sitting up in a recliner for HD treatment in order to go to an OP HD clinic. Renal Navigator has reviewed notes and observed patient in HD unit. Patient required suctioning during treatment on 09/11/19 and notes state that he has zero bed mobility. As Nephrology notes state, he is not a candidate for OP HD treatment at this time. Renal Navigator asks to be contacted should patient get to the point of meeting the above requirements in order to refer him for OP HD treatment at that time in the future.  Alphonzo Cruise, [place] Renal Navigator [phone]

## 2019-09-12 NOTE — Progress Notes (Signed)
  Speech Language Pathology Treatment: Dysphagia;[name] Speaking valve;Cognitive-Linquistic  Patient Details Name: [name] MRN: [id number] DOB: 08/11/1951 Today's Date: 09/12/2019 Time: 0900-1000 SLP Time Calculation (min) (ACUTE ONLY): 60 min  Assessment / Plan / Recommendation Clinical Impression  Pt demonstrates constant coughing, prior to and during meal. NT confirms that he coughs very frequently at baseline, even before POs were given. Pt was very willing to eat, and approx 40% of meal was offered and pt ate all of that amount, which was appropriate. Pt did need extensive effort to reposition. Best feeding method was upright posture, chin slightly tucked with controlled sips via straw or spoon. Difficult to determine true function given coughing. Recommend conservative diet of puree and honey thick liquids with precautions. Discussed need to determine pts ability to nutritionally support himself with MD. Also targeted increased verbal communication, volume of speech and orientation to situation. Will continue efforts over the weekend.    HPI HPI: 68 year old with a history of GERD and BPH who presented to [facility], ED with S OB and was found to be hypoxic.  He was initially diagnosed with COVID-19 September 20.  1 to 2 days prior to his presentation he developed worsening shortness of breath with anorexia. Admitted on 10/3, intubated.  EGD on 10/17 showed normal esophagus but excessive gastric fluid. CT scan of the chest on 10/21 showed findings consistent with an empyema as well as persistent pneumothorax.  Bedside bronchoscopy revealed material in the airway. Trached on 10/30. On ATC by 10/31.Additionally MRI on 11/4 showed There is cortical restricted diffusion posteriorly in the right frontal lobe consistent with acute infarct. Additional smaller regions of diffusion abnormality are present in the posterior left frontal lobe, anterior left frontal lobe, and right greater than left  medial parietal lobes as well as right cerebellum.       SLP Plan  Continue with current plan of care       Recommendations  Diet recommendations: Dysphagia 1 (puree);Honey-thick liquid Liquids provided via: Cup;Straw Medication Administration: Crushed with puree Supervision: Staff to assist with self feeding;Full supervision/cueing for compensatory strategies Compensations: Slow rate;Small sips/bites Postural Changes and/or Swallow Maneuvers: Seated upright 90 degrees;Upright 30-60 min after meal      Patient may use Passy-Muir Speech Valve: Intermittently with supervision;During all therapies with supervision;During PO intake/meals PMSV Supervision: Full         Follow up Recommendations: Inpatient Rehab SLP Visit Diagnosis: Dysphagia, oropharyngeal phase (R13.12) Plan: Continue with current plan of care       GO               Albert [place], MA [place] Pager [phone] Office [phone]  Lynann Beaver 09/12/2019, 1:17 PM

## 2019-09-12 NOTE — Progress Notes (Signed)
Subjective:  No significant change overnight, HD yesterday removed 1700- seems to have tolerated OK -  Has zero bed mobility -  Appreciate VVS-  Will need staged procedure to achieve access eventually but feel that he needs some time and improvement before putting him through that    Objective Vital signs in last 24 hours: Vitals:   09/12/19 0330 09/12/19 0355 09/12/19 0721 09/12/19 0752  BP:  104/83 (!) 142/84 (!) 142/84  Pulse: (!) 105 (!) 102 (!) 101 (!) 101  Resp: (!) 27 (!) 23 14 16   Temp:  98.9 F (37.2 C) 98.4 F (36.9 C)   TempSrc:   Axillary   SpO2: 100% 100% 95% 95%  Weight:      Height:       Weight change:   Intake/Output Summary (Last 24 hours) at 09/12/2019 0849 Last data filed at 09/12/2019 0600 Gross per 24 hour  Intake 1013 ml  Output 1736 ml  Net -723 ml    Assessment/ Plan: Pt is a 68 y.o. yo male with CKD who was admitted on 07/25/2019 with COVID pna-  Has progressed to being HD req since 10/15  Assessment/Plan: 1. Pulm-  Chest tube out.  Still with trach-  Being downsized- no abx-  Per CCM 2. ESRD- presumed as has been HD dep for over a month without any current signs of renal recovery- pre HD BUN and crt of 106 and 5.9.  Have been doing HD on a TTS schedule here, due tomorrow-  Has TDC.  Vein mapping done-  Will likely needs staged procedure for AVF-  Want to wait for patient to get stronger  3. Anemia- has been low- also with GIB- no hep with HD- on ESA-   iron stores low, will replete as well- supportive care  - transfused one unit on 11/17- will get labs with HD tomorrow 4. Secondary hyperparathyroidism- phos is OK - no binders 5. HTN/volume- no scheduled BP meds- trying to challenge volume with success  6. Dispo-  Cannot do OP HD with trach, also is extremely debilitated- cannot turn in bed, cant sit up for OP HD - being told that LTAC is not appropriate due to trach but maybe due to this issue of debility- not able to do OP HD or get AVF at this time-  still too debilitated    Louis Meckel    Labs: Basic Metabolic Panel: Recent Labs  Lab 09/07/19 0321 09/09/19 1130 09/10/19 0431  NA 137 133* 134*  K 4.1 3.2* 3.4*  CL 95* 92* 95*  CO2 26 26 26   GLUCOSE 98 110* 112*  BUN 41* 106* 44*  CREATININE 2.92* 5.91* 3.18*  CALCIUM 8.4* 8.5* 8.5*  PHOS 3.5 4.6  --    Liver Function Tests: Recent Labs  Lab 09/07/19 0321 09/09/19 1130 09/10/19 0431  AST  --  37 35  ALT  --  7 6  ALKPHOS  --  96 73  BILITOT  --  0.6 0.4  PROT  --  6.1* 6.6  ALBUMIN 1.5* 1.5* 1.9*   No results for input(s): LIPASE, AMYLASE in the last 168 hours. No results for input(s): AMMONIA in the last 168 hours. CBC: Recent Labs  Lab 09/06/19 0951 09/07/19 0321 09/08/19 0421 09/09/19 1130 09/10/19 0431  WBC 15.6* 14.4* 13.9* 14.3* 15.1*  NEUTROABS  --   --   --  9.0* 10.6*  HGB 8.0* 8.5* 7.6* 6.9* 8.5*  HCT 24.6* 26.7* 24.0* 21.6* 26.0*  MCV  89.8 90.5 90.6 91.9 89.3  PLT 296 277 264 233 241   Cardiac Enzymes: No results for input(s): CKTOTAL, CKMB, CKMBINDEX, TROPONINI in the last 168 hours. CBG: Recent Labs  Lab 09/11/19 0749 09/11/19 1214 09/12/19 0010 09/12/19 0425 09/12/19 0723  GLUCAP 112* 98 105* 97 81    Iron Studies:  Recent Labs    09/09/19 1145  IRON 26*  TIBC 178*  FERRITIN 879*   Studies/Results: Vas [place] Upper Ext Vein Mapping (pre-op Avf)  Result Date: 09/11/2019 UPPER EXTREMITY VEIN MAPPING  Indications: Pre-access. History: End stage renal disease.  Comparison Study: No prior study Performing Technologist: Maudry Mayhew MHA, RDMS, RVT, RDCS  Examination Guidelines: A complete evaluation includes B-mode imaging, spectral Doppler, color Doppler, and power Doppler as needed of all accessible portions of each vessel. Bilateral testing is considered an integral part of a complete examination. Limited examinations for reoccurring indications may be performed as noted.  +-----------------+-------------+----------+--------------+ Right Cephalic   Diameter (cm)Depth (cm)   Findings    +-----------------+-------------+----------+--------------+ Shoulder             0.29        0.93                  +-----------------+-------------+----------+--------------+ Prox upper arm       0.33        0.50                  +-----------------+-------------+----------+--------------+ Mid upper arm        0.32        0.57                  +-----------------+-------------+----------+--------------+ Dist upper arm       0.28        0.44                  +-----------------+-------------+----------+--------------+ Antecubital fossa    0.29        0.18     branching    +-----------------+-------------+----------+--------------+ Prox forearm         0.26        0.32                  +-----------------+-------------+----------+--------------+ Mid forearm          0.31        0.38     branching    +-----------------+-------------+----------+--------------+ Dist forearm         0.12        0.18                  +-----------------+-------------+----------+--------------+ Wrist                                   not visualized +-----------------+-------------+----------+--------------+ +-----------------+-------------+----------+--------------+ Right Basilic    Diameter (cm)Depth (cm)   Findings    +-----------------+-------------+----------+--------------+ Prox upper arm                          not visualized +-----------------+-------------+----------+--------------+ Mid upper arm                           not visualized +-----------------+-------------+----------+--------------+ Dist upper arm                          not visualized +-----------------+-------------+----------+--------------+ Antecubital  fossa                       not visualized +-----------------+-------------+----------+--------------+ Prox forearm                             not visualized +-----------------+-------------+----------+--------------+ Mid forearm                             not visualized +-----------------+-------------+----------+--------------+ Distal forearm                          not visualized +-----------------+-------------+----------+--------------+ Wrist                                   not visualized +-----------------+-------------+----------+--------------+ +-----------------+-------------+----------+--------------+ Left Cephalic    Diameter (cm)Depth (cm)   Findings    +-----------------+-------------+----------+--------------+ Shoulder             0.23        0.79                  +-----------------+-------------+----------+--------------+ Prox upper arm       0.23        0.41                  +-----------------+-------------+----------+--------------+ Mid upper arm        0.23        0.64                  +-----------------+-------------+----------+--------------+ Dist upper arm       0.16        0.43     thrombosed   +-----------------+-------------+----------+--------------+ Antecubital fossa    0.25        0.38                  +-----------------+-------------+----------+--------------+ Prox forearm         0.25        0.27     thrombosed   +-----------------+-------------+----------+--------------+ Mid forearm          0.26        0.22     thrombosed   +-----------------+-------------+----------+--------------+ Dist forearm                            not visualized +-----------------+-------------+----------+--------------+ Wrist                                   not visualized +-----------------+-------------+----------+--------------+ +-----------------+-------------+----------+--------------+ Left Basilic     Diameter (cm)Depth (cm)   Findings    +-----------------+-------------+----------+--------------+ Prox upper arm       0.44                               +-----------------+-------------+----------+--------------+ Mid upper arm        0.46                              +-----------------+-------------+----------+--------------+ Dist upper arm       0.38                              +-----------------+-------------+----------+--------------+  Antecubital fossa                       not visualized +-----------------+-------------+----------+--------------+ Prox forearm                            not visualized +-----------------+-------------+----------+--------------+ Mid forearm                             not visualized +-----------------+-------------+----------+--------------+ Distal forearm                          not visualized +-----------------+-------------+----------+--------------+ Wrist                                   not visualized +-----------------+-------------+----------+--------------+ *See table(s) above for measurements and observations.  Diagnosing physician: Monica Martinez MD Electronically signed by Monica Martinez MD on 09/11/2019 at 7:12:06 PM.    Final    Medications: Infusions: . sodium chloride Stopped (09/01/19 0000)  .  ceFAZolin (ANCEF) IV 1 g (09/11/19 2051)  . dextrose Stopped (08/16/19 1623)  . feeding supplement (NEPRO CARB STEADY) 1,000 mL (09/12/19 0548)  . ferric gluconate (FERRLECIT/NULECIT) IV 125 mg (09/11/19 1002)    Scheduled Medications: . aspirin  325 mg Per Tube Daily  . atorvastatin  40 mg Oral q1800  . B-complex with vitamin C  1 tablet Per Tube Daily  . chlorhexidine  15 mL Mouth/Throat BID  . Chlorhexidine Gluconate Cloth  6 each Topical Daily  . Chlorhexidine Gluconate Cloth  6 each Topical Q0600  . Chlorhexidine Gluconate Cloth  6 each Topical Q0600  . darbepoetin (ARANESP) injection - DIALYSIS  100 mcg Intravenous Q Thu-HD  . feeding supplement (PRO-STAT SUGAR FREE 64)  30 mL Per Tube QID  . Gerhardt's butt cream   Topical TID  . heparin injection  (subcutaneous)  5,000 Units Subcutaneous Q8H  . mouth rinse  15 mL Mouth Rinse 10 times per day  . pantoprazole sodium  40 mg Per Tube Daily  . sucralfate  1 g Oral Q6H    have reviewed scheduled and prn medications.  Physical Exam: General: seems more alert-  Verbalizing his needs but physically very limited Heart: RRR Lungs: mostly clear Abdomen: soft, non tender Extremities: pitting edema but better-  bilat boots  Dialysis Access: left TDC     09/12/2019,8:49 AM  LOS: 49 days

## 2019-09-12 NOTE — Progress Notes (Signed)
Nutrition Follow-up  DOCUMENTATION CODES:   Not applicable  INTERVENTION:   Calorie Count Initiated; further recommendations to follow upon completion of Calorie Count  Tube Feeding: Change to Nocturnal TF Nepro at 65 x 12 hours Pro-Stat 30 mL TID  Provides 108 g of protein, 1704  Kcals,  Meets 75% calorie needs, >80% protein needs   Magic cup TID with meals, each supplement provides 290 kcal and 9 grams of protein  Change to Rena-Vit by mouth, d/c B-complex with C   NUTRITION DIAGNOSIS:   Inadequate oral intake related to inability to eat(pt sedated and ventilated) as evidenced by NPO status.  Being addressed via TF   GOAL:   Patient will meet greater than or equal to 90% of their needs  Progressing   MONITOR:   Labs, TF tolerance, Skin, I & O's  REASON FOR ASSESSMENT:   Consult Enteral/tube feeding initiation and management  ASSESSMENT:   68 y/o male admitted with PNA and COVID 19   Last iHD yesterday  11/18 Diet advanced to Dysphagia I, Honey Thick  Diet downgraded to Dysphagia I, Honey Thick. Recorded po intake 25% yesterday for breakfast. No other po intake recorded.   Nepro at 45 ml/hr with Pro-Stat 30 mL QID via Cortrak  Labs: reviewed Meds: ferric gluconate, B-complex with C  Diet Order:   Diet Order            DIET - DYS 1 Room service appropriate? Yes; Fluid consistency: Honey Thick  Diet effective now              EDUCATION NEEDS:   No education needs have been identified at this time  Skin:  Skin Assessment: Skin Integrity Issues: Skin Integrity Issues:: DTI, Other (Comment) DTI: buttocks Stage I: lip Other: wound to penis  Last BM:  11/17 rectal tube  Height:   Ht Readings from Last 1 Encounters:  09/07/19 5\' 10"  (1.778 m)    Weight:   Wt Readings from Last 1 Encounters:  09/09/19 85.6 kg    Ideal Body Weight:  75.45 kg  BMI:  Body mass index is 27.08 kg/m.  Estimated Nutritional Needs:   Kcal:   2200-2400  Protein:  130-150 gm  Fluid:  1000 mL plus UOP   [employer] MS, RDN, LDN, CNSC [phone] Pager  [phone] Weekend/On-Call Pager

## 2019-09-12 NOTE — Progress Notes (Addendum)
Physical Therapy Treatment Patient Details Name: [name] MRN: [id number] DOB: [birth year] Today's Date: 09/12/2019    History of Present Illness 68 y/o male diagnosed with COVID on 9/29 presented to the [facility] ED on 10/2 with dyspnea, hypoxemia requiring intubation.  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  Tracheostomy placed on 10/30. Tested COVID neg x2 on 11/2. MRI on 11/4 showed There is cortical restricted diffusion posteriorly in the right frontal lobe consistent with acute infarct. Additional smaller regions of diffusion abnormality are present in the posterior left frontal lobe, anterior left frontal lobe, and right greater than left medial parietal lobes as well as right cerebellum.     PT Comments    Goals updated.  Pt was able to participate despite lethargy in tilting in tilt bed today, actively following commands to look left, reposition his trunk as he started leaning R and lift head off of bed.  He tolerated nearly 15 mins upright (50 degrees max tilt today) with VSS on 40% FiO2 TC and PMV donned.  Wife present and encouraging.  PT will continue to follow acutely for safe mobility progression   Follow Up Recommendations  CIR     Equipment Recommendations  Wheelchair (measurements PT);Wheelchair cushion (measurements PT);Hospital bed;Other (comment)(hoyer lift)    Recommendations for Other Services   NA     Precautions / Restrictions Precautions Precautions: Fall;Other (comment)(coretrack, trach) Required Braces or Orthoses: Other Brace(bil prevalon boots) Other Brace: bil prevalon boots    Mobility  Bed Mobility Overal bed mobility: Needs Assistance Bed Mobility: Rolling Rolling: Max assist         General bed mobility comments: Max assist to roll for positioning on side at end of session.       Modified Rankin (Stroke Patients  Only) Modified Rankin (Stroke Patients Only) Pre-Morbid Rankin Score: No symptoms Modified Rankin: Severe disability     Balance Overall balance assessment: Needs assistance         Standing balance support: Single extremity supported Standing balance-Leahy Scale: Zero Standing balance comment: total assist using the tilt bed, firt 45 degrees and then max of 50 degrees upright.  BPs, HR and O2 stable throughout on 40% TC 10 L.  Pt reported lightheadedness, but no drop in BP.  Also reported bil shoulder discomfort.  Worked on picking up head off of pillow, looking L actively "find [name] (his wife)", and repositioning as his trunk would lean R in tilt pt able to pull with trunk and push with R UE on bed rail to attempt to reposition. PT positioned on his left side to ensure that L knee was fully blocked.                              Cognition Arousal/Alertness: Lethargic Behavior During Therapy: Flat affect Overall Cognitive Status: Impaired/Different from baseline Area of Impairment: Attention;Safety/judgement;Following commands;Awareness;Problem solving                   Current Attention Level: Sustained   Following Commands: Follows one step commands with increased time Safety/Judgement: Decreased awareness of safety;Decreased awareness of deficits Awareness: Emergent Problem Solving: Slow processing;Decreased initiation;Difficulty sequencing;Requires verbal cues General Comments: Pt able to vocalize when encouraged via PMV in cuffless trach, lethargic today, but participatory.       Exercises General Exercises - Upper Extremity Shoulder Flexion:  PROM;Left;10 reps;AAROM;Right(gentle on the L due to sublux) Elbow Flexion: PROM;Left;AAROM;Right;10 reps General Exercises - Lower Extremity Ankle Circles/Pumps: PROM;Left;10 reps;AAROM;Right Heel Slides: PROM;Both;10 reps Hip ABduction/ADduction: PROM;Both;10 reps Other Exercises Other Exercises: hip IR/ER bil  PROM x 10 reps Other Exercises: Lower trunk rotation bil x10 reps Other Exercises: cervical spine PROM lateral flexion to the left and left rotation x 5 mins        Pertinent Vitals/Pain Pain Assessment: Faces Faces Pain Scale: Hurts even more Pain Location: bil shoulders Pain Descriptors / Indicators: Grimacing;Guarding Pain Intervention(s): Limited activity within patient's tolerance;Monitored during session;Repositioned           PT Goals (current goals can now be found in the care plan section) Acute Rehab PT Goals Patient Stated Goal: to stand today and walk PT Goal Formulation: With patient/family Time For Goal Achievement: 09/26/19 Potential to Achieve Goals: Fair Progress towards PT goals: Progressing toward goals    Frequency    Min 3X/week      PT Plan Current plan remains appropriate       AM-PAC PT "6 Clicks" Mobility   Outcome Measure  Help needed turning from your back to your side while in a flat bed without using bedrails?: Total Help needed moving from lying on your back to sitting on the side of a flat bed without using bedrails?: Total Help needed moving to and from a bed to a chair (including a wheelchair)?: Total Help needed standing up from a chair using your arms (e.g., wheelchair or bedside chair)?: Total Help needed to walk in hospital room?: Total Help needed climbing 3-5 steps with a railing? : Total 6 Click Score: 6    End of Session Equipment Utilized During Treatment: Oxygen Activity Tolerance: Patient limited by lethargy Patient left: in bed;with call bell/phone within reach;with family/visitor present   PT Visit Diagnosis: Unsteadiness on feet (R26.81);Other abnormalities of gait and mobility (R26.89);Muscle weakness (generalized) (M62.81);Difficulty in walking, not elsewhere classified (R26.2)     Time: 1660-6301 PT Time Calculation (min) (ACUTE ONLY): 47 min  Charges:  $Therapeutic Exercise: 23-37 mins $Therapeutic  Activity: 8-22 mins             Verdene Lennert, PT, DPT  Acute Rehabilitation [phone] pager #[phone] office  @ [facility]: [phone]            09/12/2019, 3:26 PM

## 2019-09-12 NOTE — Progress Notes (Signed)
PROGRESS NOTE   [name]  EUM:[id number]    DOB: 03-Sep-1951    DOA: 07/25/2019  PCP: Kathyrn Drown, MD   I have briefly reviewed patients previous medical records in [facility].  Chief Complaint  Patient presents with  . Shortness of Breath    Brief Narrative:  68 year old male with PMH of GERD and BPH, initially presented on 07/25/2019 with worsening dyspnea after recently testing positive for COVID-19 on 07/22/2019.  He required emergent intubation at [facility] ED.  He was admitted to the [facility] hospital for acute hypoxic respiratory failure secondary to severe ARDS from Covid pneumonia and completed Decadron, Remdesivir and Tocilizumab.  Hospital course complicated by acute kidney injury requiring CRRT, encephalopathy, pneumothorax complicated by persistent air leak and bronchopleural fistula requiring endobronchial blocker and pleur-evac, prolonged mechanical ventilation s/p tracheostomy on 10/30, MSSA bacteremia with negative echocardiogram intermittent pressor support with dialysis, melena with acute blood loss anemia requiring transfusion, IV PPI, empiric octreotide, EGD showed gastric ulcer and nonbleeding visible vessel clipped, transferred to [facility] on 08/26/2023 intermittent HD, subacute right cerebral/cerebellar infarcts on MRI.  PCCM following for tracheostomy management, weekly.  Nephrology on board for HD needs.  VVS will consult for permanent dialysis access.  Assessment & Plan:   Principal Problem:   Pneumonia due to COVID-19 virus Active Problems:   BPH (benign prostatic hyperplasia)   Acute respiratory failure due to COVID-19 (HCC)   AKI (acute kidney injury) (HCC)   Fluid overload   Pneumothorax on right   Acute renal failure (ARF) (HCC)   GI bleed   Atrial fibrillation with RVR (HCC)   Constipation   Gastric ulcer with hemorrhage   Acute respiratory failure (HCC)   Chest tube in place   Primary spontaneous pneumothorax   Acute  respiratory distress syndrome (ARDS) due to COVID-19 virus ([place])   Tracheostomy status ([place])   Cerebral thrombosis with cerebral infarction   Advanced care planning/counseling discussion   Goals of care, counseling/discussion   Palliative care by specialist   Sinus tachycardia   Acute blood loss anemia   H. pylori infection   CVA (cerebral vascular accident) ([place])   Anoxic brain injury ([place])   MSSA bacteremia   Positive D dimer   Pseudomonas infection   Infection due to acinetobacter baumannii   Acute hypoxic respiratory failure secondary to severe ARDS from Covid pneumonia.  Completed remdesivir/Decadron/Actemra/convalescent plasma therapy.  ARDS resolved.  Covid testing negative x2 on 11/1 and 11/2.  Stable on trach collar, management per PCCM.  Continue trach collar at 28%, briefly increased to 40% at dialysis due to transient hypoxia likely related to secretions.  Using Passy-Muir valve.  Tracheostomy downsized 09/07/2021.  PCCM to follow weekly or as needed.  S/p chest tube due to pneumothorax, removed 09/08/2019.  TCTS signed off 11/17.  ESRD  Started off as acute kidney injury complicating stage III CKD.  Has now progressed to ESRD.  Nephrology continues to assist.  ESRD presumed as he has been HD dependent for over a month without signs of renal recovery.  Ongoing TTS schedule, next dialysis 11/19.  Currently dialyzed through TTS, vascular surgery recommend staged procedure for aVF and would like to wait further until patient gets stronger from his debility.  I discussed with Dr. Moshe Cipro.  He is not appropriate for SNF because unable to sit for outpatient HD.  Only options would be LTAC or CIR.  Septic shock, resolved  Right pneumothorax/empyema/necrotic right lower lobe/bronchopleural fistula with  large air leak  Chest tube removed by CT surgery on 11/16.  Periodic chest x-ray follow-up.  Appears stable for now.  Chest x-ray 11/17: Unchanged small right  basal lateral pneumothorax.  Acute on chronic anemia  Secondary to gastric ulcer, s/p EGD with culprit vessel clipping.  S/p PRBC transfusion 1 unit on 11/13.  Hemoglobin had dropped again to 6.9 on 11/17.  Transfused second unit PRBC on 11/17.  Hemoglobin up to 8.5 on 11/18.  Periodically follow CBC across HD.  Labs to be drawn at HD tomorrow.  H. pylori positive by EGD biopsy   GI recommends waiting until clinically stable after discharge and starting 2 weeks regimen to prevent ulcer recurrence. GI (per Dr. Cristina Gong 10/27) to follow up in 2-3 months from hospitalization to arrange this and possible f/u EGD.   CVA  Small bilateral cerebral/right cerebellum infarcts 11/3 likely due to hypotension during dialysis, sepsis and deconditioning.  PAF is also potential etiology.  Mental status continues to gradually improve.  Has residual left hemiplegia, dysphagia.  Neurology recommends aspirin 325 mg daily, permissive hypertension.  No anticoagulation given history of GI bleed.  Follow-up in 4 weeks after discharge at stroke clinic NP/GNA.  Continue therapies evaluation and will need to determine disposition i.e. CIR versus LTAC.   Dysphagia  Has right nasal core track.  Speech therapy input appreciated, advancing diet.  Hopefully can tolerate enough to DC core track at some point soon.  Otherwise may need to consider PEG.  Patient seen tolerating dysphagia 1 diet and honey thickened liquids, patient tolerating well but has to be fed because he cannot feed himself.  Discussed with nursing and speech therapy, will hold tube feeds, encourage oral intake, check calorie count.  We will leave core track until sure that he is tolerating enough by mouth.  Anoxic brain injury  As per neurology MRI brain 11/3 consistent with anoxic brain injury.  Seems to be coherent and responds to questions appropriately.  Atrial fibrillation, paroxysmal  Currently in sinus rhythm.  Briefly required  amiodarone earlier in the hospitalization.  Continue aspirin.  Not anticoagulation candidate due to GI bleed.  MSSA bacteremia  TTE negative but still high concern for infective endocarditis and hence continue cefazolin until end date of 11/23 i.e. total of 6 weeks.  Pseudomonas and Acinetobacter in BAL  Resolved.  Completed 10 days course of meropenem.  Elevated D-dimer  No full anticoagulation with GI bleed.  Dopplers negative for DVT on 10/8.  Likely related to acute illness/acute infectious etiology.  Pressure Injury 08/24/19 Sacrum Right;Left;Mid Stage II -  Partial thickness loss of dermis presenting as a shallow open ulcer with a red, pink wound bed without slough.  (Active)  08/24/19 0800  Location: Sacrum  Location Orientation: Right;Left;Mid  Staging: Stage II -  Partial thickness loss of dermis presenting as a shallow open ulcer with a red, pink wound bed without slough.  Wound Description (Comments): -- (stage 11 mid sacrum)  Present on Admission: No    Goals of care Guarded prognosis.  Family wish to pursue full scope of treatment.  Palliative medicine assisted with GOC.  Diarrhea Unclear etiology.  Not much stool in the Flexi-Seal.  Monitor closely and consider discontinuing Flexi-Seal if no significant diarrhea. Discussed in detail with patient's RN to document type and volume of stool.  Flexi-Seal reportedly fell off yesterday but patient was oozing loose stools.  If has persistent loose stools, may need to get dietitian to change tube feeds.  Body mass index is 27.08 kg/m.  Nutritional Status Nutrition Problem: Inadequate oral intake Etiology: inability to eat(pt sedated and ventilated) Signs/Symptoms: NPO status Interventions: Tube feeding  DVT prophylaxis: SCDs Code Status: DNR Family Communication: None at bedside Disposition: To be determined.?  CIR versus LTAC.   Consultants:  ID, nephrology, GI, neurology PCCM, Palliative  Procedures:  Chest  tube removed, 11/16 Tunneled HD cath, 11/10 HD, 11/3 Tracheostomy 10/30 EGD 10/17. Gastric ulcer, nonbleeding visible vessel clipped CRRT 10/15 Bronchoscopy with endobronchial ballon, 10/15, repeat 10/16 Right-sided chest tube, 10/14 Right pigtail chest tube--for right PTX 10/13-2/14 Right IJ 10/2-10/10 ETT 10/2  Antimicrobials:  None.   Subjective: Interviewed and examined along with RN in room.  Denies complaints.  Discussed with RN regarding documenting oral intake and stool volume to determine if Flexi-Seal can be removed.  No stool noted in Flexi-Seal this morning.  Objective:  Vitals:   09/12/19 1122 09/12/19 1527 09/12/19 1544 09/12/19 1545  BP: (!) 147/92 129/79 123/80 123/80  Pulse: (!) 112 91 89 89  Resp: (!) 22 (!) 22    Temp:      TempSrc:      SpO2: 98% (!) 78% 100% 100%  Weight:      Height:        Examination:  General exam: Pleasant middle-age male, moderately built and nourished lying comfortably propped up in bed without distress.  Tracheostomy with PMV valve. Respiratory system: Diminished breath sounds in the bases but otherwise clear to auscultation.  No increased work of breathing.  Stable without change. Cardiovascular system: S1 & S2 heard, RRR. No JVD, murmurs, rubs, gallops or clicks. No pedal edema.  Has left upper extremity dependent edema.  Trace left lower extremity edema.  Telemetry personally reviewed: SR-ST up to 110. Gastrointestinal system: Abdomen is nondistended, soft and nontender. No organomegaly or masses felt. Normal bowel sounds heard. Central nervous system: Sleeping but easily aroused, alert and responded appropriately to a couple questions.  Appears to have aphasia and some dysarthria Extremities: Left-sided hemiplegia with grade 0 x 5 power.  Right upper extremity grade 3 x 5 distally, 2 x 5 proximally.  Right lower extremity grade 2 x 5 power. Skin: No rashes, lesions or ulcers Psychiatry: Judgement and insight appear impaired.  Mood & affect appropriate.     Data Reviewed: I have personally reviewed following labs and imaging studies   CBC: Recent Labs  Lab 09/06/19 0951 09/07/19 0321 09/08/19 0421 09/09/19 1130 09/10/19 0431  WBC 15.6* 14.4* 13.9* 14.3* 15.1*  NEUTROABS  --   --   --  9.0* 10.6*  HGB 8.0* 8.5* 7.6* 6.9* 8.5*  HCT 24.6* 26.7* 24.0* 21.6* 26.0*  MCV 89.8 90.5 90.6 91.9 89.3  PLT 296 277 264 233 102    Basic Metabolic Panel: Recent Labs  Lab 09/07/19 0321 09/09/19 1130 09/09/19 1145 09/10/19 0431  NA 137 133*  --  134*  K 4.1 3.2*  --  3.4*  CL 95* 92*  --  95*  CO2 26 26  --  26  GLUCOSE 98 110*  --  112*  BUN 41* 106*  --  44*  CREATININE 2.92* 5.91*  --  3.18*  CALCIUM 8.4* 8.5*  --  8.5*  MG  --  2.6* 2.4  --   PHOS 3.5 4.6  --   --     Liver Function Tests: Recent Labs  Lab 09/07/19 0321 09/09/19 1130 09/10/19 0431  AST  --  37 35  ALT  --  7 6  ALKPHOS  --  96 73  BILITOT  --  0.6 0.4  PROT  --  6.1* 6.6  ALBUMIN 1.5* 1.5* 1.9*    CBG: Recent Labs  Lab 09/12/19 0010 09/12/19 0425 09/12/19 0723 09/12/19 1119 09/12/19 1612  GLUCAP 105* 97 81 104* 86    No results found for this or any previous visit (from the past 240 hour(s)).    Radiology Studies: Vas [place] Upper Ext Vein Mapping (pre-op Avf)  Result Date: 09/11/2019 UPPER EXTREMITY VEIN MAPPING  Indications: Pre-access. History: End stage renal disease.  Comparison Study: No prior study Performing Technologist: Maudry Mayhew MHA, RDMS, RVT, RDCS  Examination Guidelines: A complete evaluation includes B-mode imaging, spectral Doppler, color Doppler, and power Doppler as needed of all accessible portions of each vessel. Bilateral testing is considered an integral part of a complete examination. Limited examinations for reoccurring indications may be performed as noted. +-----------------+-------------+----------+--------------+ Right Cephalic   Diameter (cm)Depth (cm)   Findings     +-----------------+-------------+----------+--------------+ Shoulder             0.29        0.93                  +-----------------+-------------+----------+--------------+ Prox upper arm       0.33        0.50                  +-----------------+-------------+----------+--------------+ Mid upper arm        0.32        0.57                  +-----------------+-------------+----------+--------------+ Dist upper arm       0.28        0.44                  +-----------------+-------------+----------+--------------+ Antecubital fossa    0.29        0.18     branching    +-----------------+-------------+----------+--------------+ Prox forearm         0.26        0.32                  +-----------------+-------------+----------+--------------+ Mid forearm          0.31        0.38     branching    +-----------------+-------------+----------+--------------+ Dist forearm         0.12        0.18                  +-----------------+-------------+----------+--------------+ Wrist                                   not visualized +-----------------+-------------+----------+--------------+ +-----------------+-------------+----------+--------------+ Right Basilic    Diameter (cm)Depth (cm)   Findings    +-----------------+-------------+----------+--------------+ Prox upper arm                          not visualized +-----------------+-------------+----------+--------------+ Mid upper arm                           not visualized +-----------------+-------------+----------+--------------+ Dist upper arm                          not visualized +-----------------+-------------+----------+--------------+ Antecubital fossa  not visualized +-----------------+-------------+----------+--------------+ Prox forearm                            not visualized +-----------------+-------------+----------+--------------+ Mid forearm                              not visualized +-----------------+-------------+----------+--------------+ Distal forearm                          not visualized +-----------------+-------------+----------+--------------+ Wrist                                   not visualized +-----------------+-------------+----------+--------------+ +-----------------+-------------+----------+--------------+ Left Cephalic    Diameter (cm)Depth (cm)   Findings    +-----------------+-------------+----------+--------------+ Shoulder             0.23        0.79                  +-----------------+-------------+----------+--------------+ Prox upper arm       0.23        0.41                  +-----------------+-------------+----------+--------------+ Mid upper arm        0.23        0.64                  +-----------------+-------------+----------+--------------+ Dist upper arm       0.16        0.43     thrombosed   +-----------------+-------------+----------+--------------+ Antecubital fossa    0.25        0.38                  +-----------------+-------------+----------+--------------+ Prox forearm         0.25        0.27     thrombosed   +-----------------+-------------+----------+--------------+ Mid forearm          0.26        0.22     thrombosed   +-----------------+-------------+----------+--------------+ Dist forearm                            not visualized +-----------------+-------------+----------+--------------+ Wrist                                   not visualized +-----------------+-------------+----------+--------------+ +-----------------+-------------+----------+--------------+ Left Basilic     Diameter (cm)Depth (cm)   Findings    +-----------------+-------------+----------+--------------+ Prox upper arm       0.44                              +-----------------+-------------+----------+--------------+ Mid upper arm        0.46                               +-----------------+-------------+----------+--------------+ Dist upper arm       0.38                              +-----------------+-------------+----------+--------------+ Antecubital fossa  not visualized +-----------------+-------------+----------+--------------+ Prox forearm                            not visualized +-----------------+-------------+----------+--------------+ Mid forearm                             not visualized +-----------------+-------------+----------+--------------+ Distal forearm                          not visualized +-----------------+-------------+----------+--------------+ Wrist                                   not visualized +-----------------+-------------+----------+--------------+ *See table(s) above for measurements and observations.  Diagnosing physician: Monica Martinez MD Electronically signed by Monica Martinez MD on 09/11/2019 at 7:12:06 PM.    Final           Scheduled Meds: . aspirin  325 mg Per Tube Daily  . atorvastatin  40 mg Oral q1800  . chlorhexidine  15 mL Mouth/Throat BID  . Chlorhexidine Gluconate Cloth  6 each Topical Daily  . Chlorhexidine Gluconate Cloth  6 each Topical Q0600  . Chlorhexidine Gluconate Cloth  6 each Topical Q0600  . darbepoetin (ARANESP) injection - DIALYSIS  100 mcg Intravenous Q Thu-HD  . [START ON 09/13/2019] feeding supplement (NEPRO CARB STEADY)  1,000 mL Oral Q24H  . feeding supplement (PRO-STAT SUGAR FREE 64)  30 mL Per Tube TID  . Gerhardt's butt cream   Topical TID  . heparin injection (subcutaneous)  5,000 Units Subcutaneous Q8H  . mouth rinse  15 mL Mouth Rinse 10 times per day  . multivitamin  1 tablet Oral QHS  . pantoprazole sodium  40 mg Per Tube Daily  . sucralfate  1 g Oral Q6H   Continuous Infusions: . sodium chloride Stopped (09/01/19 0000)  .  ceFAZolin (ANCEF) IV 1 g (09/11/19 2051)  . dextrose Stopped (08/16/19 1623)  . ferric gluconate  (FERRLECIT/NULECIT) IV 125 mg (09/12/19 1210)     LOS: 48 days     Vernell Leep, MD, [place], [facility]. [facility]  To contact the attending provider between 7A-7P or the covering provider during after hours 7P-7A, please log into the web site [email or web address] and access using universal [place] password for that web site. If you do not have the password, please call the hospital operator.  09/12/2019, 5:23 PM

## 2019-09-13 DIAGNOSIS — N186 End stage renal disease: Secondary | ICD-10-CM | POA: Diagnosis not present

## 2019-09-13 DIAGNOSIS — J9601 Acute respiratory failure with hypoxia: Secondary | ICD-10-CM | POA: Diagnosis not present

## 2019-09-13 DIAGNOSIS — Z992 Dependence on renal dialysis: Secondary | ICD-10-CM | POA: Diagnosis not present

## 2019-09-13 LAB — GLUCOSE, CAPILLARY
Glucose-Capillary: 105 mg/dL — ABNORMAL HIGH (ref 70–99)
Glucose-Capillary: 74 mg/dL (ref 70–99)
Glucose-Capillary: 78 mg/dL (ref 70–99)
Glucose-Capillary: 79 mg/dL (ref 70–99)
Glucose-Capillary: 82 mg/dL (ref 70–99)
Glucose-Capillary: 85 mg/dL (ref 70–99)
Glucose-Capillary: 86 mg/dL (ref 70–99)
Glucose-Capillary: 87 mg/dL (ref 70–99)
Glucose-Capillary: 88 mg/dL (ref 70–99)

## 2019-09-13 LAB — CBC
HCT: 23.2 % — ABNORMAL LOW (ref 39.0–52.0)
Hemoglobin: 7.2 g/dL — ABNORMAL LOW (ref 13.0–17.0)
MCH: 28.7 pg (ref 26.0–34.0)
MCHC: 31 g/dL (ref 30.0–36.0)
MCV: 92.4 fL (ref 80.0–100.0)
Platelets: 266 10*3/uL (ref 150–400)
RBC: 2.51 MIL/uL — ABNORMAL LOW (ref 4.22–5.81)
RDW: 14.1 % (ref 11.5–15.5)
WBC: 15.5 10*3/uL — ABNORMAL HIGH (ref 4.0–10.5)
nRBC: 0.3 % — ABNORMAL HIGH (ref 0.0–0.2)

## 2019-09-13 LAB — RENAL FUNCTION PANEL
Albumin: 1.7 g/dL — ABNORMAL LOW (ref 3.5–5.0)
Anion gap: 13 (ref 5–15)
BUN: 60 mg/dL — ABNORMAL HIGH (ref 8–23)
CO2: 26 mmol/L (ref 22–32)
Calcium: 8.5 mg/dL — ABNORMAL LOW (ref 8.9–10.3)
Chloride: 95 mmol/L — ABNORMAL LOW (ref 98–111)
Creatinine, Ser: 4.37 mg/dL — ABNORMAL HIGH (ref 0.61–1.24)
GFR calc Af Amer: 15 mL/min — ABNORMAL LOW (ref >60)
GFR calc non Af Amer: 13 mL/min — ABNORMAL LOW (ref >60)
Glucose, Bld: 90 mg/dL (ref 70–99)
Phosphorus: 3.1 mg/dL (ref 2.5–4.6)
Potassium: 3.5 mmol/L (ref 3.5–5.1)
Sodium: 134 mmol/L — ABNORMAL LOW (ref 135–145)

## 2019-09-13 MED ORDER — CHLORHEXIDINE GLUCONATE CLOTH 2 % EX PADS: 6.0000 | MEDICATED_PAD | Freq: Every day | CUTANEOUS | Status: DC

## 2019-09-13 MED ORDER — HEPARIN SODIUM (PORCINE) 1000 UNIT/ML IJ SOLN
INTRAMUSCULAR | Status: AC
  Administered 2019-09-13: 1000 [IU]
  Filled 2019-09-13: qty 4

## 2019-09-13 NOTE — Progress Notes (Signed)
SLP Cancellation Note  Patient Details Name: [name] MRN: [id number] DOB: 1951-06-26   Cancelled treatment:       Reason Eval/Treat Not Completed: Patient at procedure or test/unavailable. Pt in HD. Will try back tomorrow.    Keela Rubert, Katherene Ponto 09/13/2019, 1:13 PM

## 2019-09-13 NOTE — Progress Notes (Signed)
Subjective:  No significant change overnight,  Due for dialysis later today - no /co's  Objective Vital signs in last 24 hours: Vitals:   09/13/19 0307 09/13/19 0500 09/13/19 0752 09/13/19 0819  BP:   126/82   Pulse: (!) 101  (!) 102 98  Resp: 18  18 20   Temp:   98.6 F (37 C)   TempSrc:   Oral   SpO2: 94%  90% 94%  Weight:  93.4 kg    Height:       Weight change:   Intake/Output Summary (Last 24 hours) at 09/13/2019 1048 Last data filed at 09/13/2019 0600 Gross per 24 hour  Intake 200 ml  Output 50 ml  Net 150 ml    Assessment/ Plan: Pt is a 68 y.o. yo male with CKD who was admitted on 07/25/2019 with COVID pna-  Has progressed to being HD req since 10/15  Assessment/Plan: 1. Pulm-  Chest tube out.  Still with trach-  Being downsized- no abx-  Per CCM 2. ESRD- presumed as has been HD dep for 5 weeks without any current signs of renal recovery- pre HD BUN and crt of 106 and 5.9.  Have been doing HD on a TTS schedule here, due today-  Has TDC.  Vein mapping done-  Will likely needs staged procedure for AVF-  Want to wait for patient to get stronger  3. Anemia- has been low- also with GIB- no hep with HD- on ESA-   iron stores low, will replete as well- supportive care  - transfused one unit on 11/17- will get labs with HD today 4. Secondary hyperparathyroidism- phos is OK - no binders 5. HTN/volume- no scheduled BP meds- trying to challenge volume with success  6. Dispo-  Cannot do OP HD with trach, also is extremely debilitated- cannot turn in bed, cant sit up for OP HD - being told that LTAC is not appropriate due to trach but maybe due to this issue of debility- not able to do OP HD or get AVF at this time- still too debilitated    Louis Meckel    Labs: Basic Metabolic Panel: Recent Labs  Lab 09/07/19 0321 09/09/19 1130 09/10/19 0431  NA 137 133* 134*  K 4.1 3.2* 3.4*  CL 95* 92* 95*  CO2 26 26 26   GLUCOSE 98 110* 112*  BUN 41* 106* 44*  CREATININE 2.92*  5.91* 3.18*  CALCIUM 8.4* 8.5* 8.5*  PHOS 3.5 4.6  --    Liver Function Tests: Recent Labs  Lab 09/07/19 0321 09/09/19 1130 09/10/19 0431  AST  --  37 35  ALT  --  7 6  ALKPHOS  --  96 73  BILITOT  --  0.6 0.4  PROT  --  6.1* 6.6  ALBUMIN 1.5* 1.5* 1.9*   No results for input(s): LIPASE, AMYLASE in the last 168 hours. No results for input(s): AMMONIA in the last 168 hours. CBC: Recent Labs  Lab 09/07/19 0321 09/08/19 0421 09/09/19 1130 09/10/19 0431  WBC 14.4* 13.9* 14.3* 15.1*  NEUTROABS  --   --  9.0* 10.6*  HGB 8.5* 7.6* 6.9* 8.5*  HCT 26.7* 24.0* 21.6* 26.0*  MCV 90.5 90.6 91.9 89.3  PLT 277 264 233 241   Cardiac Enzymes: No results for input(s): CKTOTAL, CKMB, CKMBINDEX, TROPONINI in the last 168 hours. CBG: Recent Labs  Lab 09/12/19 1612 09/12/19 1959 09/13/19 0009 09/13/19 0415 09/13/19 0736  GLUCAP 86 79 105* 87 85  Iron Studies:  No results for input(s): IRON, TIBC, TRANSFERRIN, FERRITIN in the last 72 hours. Studies/Results: No results found. Medications: Infusions: . sodium chloride Stopped (09/01/19 0000)  .  ceFAZolin (ANCEF) IV 1 g (09/12/19 2336)  . dextrose Stopped (08/16/19 1623)  . ferric gluconate (FERRLECIT/NULECIT) IV 125 mg (09/12/19 1210)    Scheduled Medications: . aspirin  325 mg Per Tube Daily  . atorvastatin  40 mg Oral q1800  . chlorhexidine  15 mL Mouth/Throat BID  . Chlorhexidine Gluconate Cloth  6 each Topical Daily  . Chlorhexidine Gluconate Cloth  6 each Topical Q0600  . Chlorhexidine Gluconate Cloth  6 each Topical Q0600  . darbepoetin (ARANESP) injection - DIALYSIS  100 mcg Intravenous Q Thu-HD  . feeding supplement (NEPRO CARB STEADY)  1,000 mL Oral Q24H  . feeding supplement (PRO-STAT SUGAR FREE 64)  30 mL Per Tube TID  . Gerhardt's butt cream   Topical TID  . heparin injection (subcutaneous)  5,000 Units Subcutaneous Q8H  . mouth rinse  15 mL Mouth Rinse 10 times per day  . multivitamin  1 tablet Oral QHS   . pantoprazole sodium  40 mg Per Tube Daily  . sucralfate  1 g Oral Q6H    have reviewed scheduled and prn medications.  Physical Exam: General: seems more alert-  Verbalizing his needs but physically very limited Heart: RRR Lungs: mostly clear Abdomen: soft, non tender Extremities: pitting edema but better-  bilat boots  Dialysis Access: left TDC     09/13/2019,10:48 AM  LOS: 50 days

## 2019-09-13 NOTE — Progress Notes (Addendum)
PROGRESS NOTE   [name]  AOZ:[id number]    DOB: 04/26/1951    DOA: 07/25/2019  PCP: Kathyrn Drown, MD   I have briefly reviewed patients previous medical records in [facility].  Chief Complaint  Patient presents with  . Shortness of Breath    Brief Narrative:  68 year old male with PMH of GERD and BPH, initially presented on 07/25/2019 with worsening dyspnea after recently testing positive for COVID-19 on 07/22/2019.  He required emergent intubation at [facility] ED.  He was admitted to the [facility] hospital for acute hypoxic respiratory failure secondary to severe ARDS from Covid pneumonia and completed Decadron, Remdesivir and Tocilizumab.  Hospital course complicated by acute kidney injury requiring CRRT, encephalopathy, pneumothorax complicated by persistent air leak and bronchopleural fistula requiring endobronchial blocker and pleur-evac, prolonged mechanical ventilation s/p tracheostomy on 10/30, MSSA bacteremia with negative echocardiogram intermittent pressor support with dialysis, melena with acute blood loss anemia requiring transfusion, IV PPI, empiric octreotide, EGD showed gastric ulcer and nonbleeding visible vessel clipped, transferred to [facility] on 08/26/2023 intermittent HD, subacute right cerebral/cerebellar infarcts on MRI.  PCCM following for tracheostomy management, weekly.  Nephrology on board for HD needs.  VVS will consult for permanent dialysis access.  Assessment & Plan:   Principal Problem:   Pneumonia due to COVID-19 virus Active Problems:   BPH (benign prostatic hyperplasia)   Acute respiratory failure due to COVID-19 (HCC)   AKI (acute kidney injury) (HCC)   Fluid overload   Pneumothorax on right   Acute renal failure (ARF) (HCC)   GI bleed   Atrial fibrillation with RVR (HCC)   Constipation   Gastric ulcer with hemorrhage   Acute respiratory failure (HCC)   Chest tube in place   Primary spontaneous pneumothorax   Acute  respiratory distress syndrome (ARDS) due to COVID-19 virus ([place])   Tracheostomy status ([place])   Cerebral thrombosis with cerebral infarction   Advanced care planning/counseling discussion   Goals of care, counseling/discussion   Palliative care by specialist   Sinus tachycardia   Acute blood loss anemia   H. pylori infection   CVA (cerebral vascular accident) ([place])   Anoxic brain injury ([place])   MSSA bacteremia   Positive D dimer   Pseudomonas infection   Infection due to acinetobacter baumannii   Acute hypoxic respiratory failure secondary to severe ARDS from Covid pneumonia.  Completed remdesivir/Decadron/Actemra/convalescent plasma therapy.  ARDS resolved.  Covid testing negative x2 on 11/1 and 11/2.  Stable on trach collar, management per PCCM.  Continue trach collar at 28%, briefly increased to 40% at dialysis due to transient hypoxia likely related to secretions.  Using Passy-Muir valve.  Tracheostomy downsized 09/07/2021.  PCCM to follow weekly or as needed.  S/p chest tube due to pneumothorax, removed 09/08/2019.  TCTS signed off 11/17.  ESRD  Started off as acute kidney injury complicating stage III CKD.  Has now progressed to ESRD.  Nephrology continues to assist.  ESRD presumed as he has been HD dependent for over a month without signs of renal recovery.  Ongoing TTS schedule, s/p HD 11/21.  Currently dialyzed through TTS, vascular surgery recommend staged procedure for aVF and would like to wait further until patient gets stronger from his debility.  I discussed with Dr. Moshe Cipro.  He is not appropriate for SNF because unable to sit for outpatient HD.  Only options would be LTAC or CIR.  Septic shock, resolved  Right pneumothorax/empyema/necrotic right lower lobe/bronchopleural fistula with  large air leak  Chest tube removed by CT surgery on 11/16.  Periodic chest x-ray follow-up.  Appears stable for now.  Chest x-ray 11/17: Unchanged small right basal  lateral pneumothorax.  Acute on chronic anemia  Secondary to gastric ulcer, s/p EGD with culprit vessel clipping.  S/p PRBC transfusion 1 unit on 11/13.  Hemoglobin had dropped again to 6.9 on 11/17.  Transfused second unit PRBC on 11/17.  Hemoglobin up to 8.5 on 11/18.  Hb has dropped to 7.2 again on 11/21.  No overt bleeding/GI bleed.  Check CBC in a.m. and transfuse if hemoglobin 7 g or less.  Continue IV ferric gluconate and Aranesp.  H. pylori positive by EGD biopsy   GI recommends waiting until clinically stable after discharge and starting 2 weeks regimen to prevent ulcer recurrence. GI (per Dr. Cristina Gong 10/27) to follow up in 2-3 months from hospitalization to arrange this and possible f/u EGD.   CVA  Small bilateral cerebral/right cerebellum infarcts 11/3 likely due to hypotension during dialysis, sepsis and deconditioning.  PAF is also potential etiology.  Mental status continues to gradually improve.  Has residual left hemiplegia, dysphagia.  Neurology recommends aspirin 325 mg daily, permissive hypertension.  No anticoagulation given history of GI bleed.  Follow-up in 4 weeks after discharge at stroke clinic [facility].  Continue therapies evaluation and will need to determine disposition i.e. CIR versus LTAC.   Dysphagia  Has right nasal core track.  Speech therapy input appreciated, advancing diet.  Hopefully can tolerate enough to DC core track at some point soon.  Otherwise may need to consider PEG.  Patient seen tolerating dysphagia 1 diet and honey thickened liquids, patient tolerating well but has to be fed because he cannot feed himself.  Despite recommendations, tube feeding was continued overnight.  Discussed with RN this morning, hold tube feeds, encourage oral intake and closely monitor in chart calorie intake.  Anoxic brain injury  As per neurology MRI brain 11/3 consistent with anoxic brain injury.  Seems to be coherent and responds to questions  appropriately.  Atrial fibrillation, paroxysmal  Currently in sinus rhythm.  Briefly required amiodarone earlier in the hospitalization.  Continue aspirin.  Not anticoagulation candidate due to GI bleed.  MSSA bacteremia  TTE negative but still high concern for infective endocarditis and hence continue cefazolin until end date of 11/23 i.e. total of 6 weeks.  Pseudomonas and Acinetobacter in BAL  Resolved.  Completed 10 days course of meropenem.  Elevated D-dimer  No full anticoagulation with GI bleed.  Dopplers negative for DVT on 10/8.  Likely related to acute illness/acute infectious etiology.  Pressure Injury 08/24/19 Sacrum Right;Left;Mid Stage II -  Partial thickness loss of dermis presenting as a shallow open ulcer with a red, pink wound bed without slough.  (Active)  08/24/19 0800  Location: Sacrum  Location Orientation: Right;Left;Mid  Staging: Stage II -  Partial thickness loss of dermis presenting as a shallow open ulcer with a red, pink wound bed without slough.  Wound Description (Comments): -- (stage 11 mid sacrum)  Present on Admission: No    Goals of care Guarded prognosis.  Family wish to pursue full scope of treatment.  Palliative medicine assisted with GOC.  Diarrhea Unclear etiology.  Not much stool in the Flexi-Seal.  Monitor closely and consider discontinuing Flexi-Seal if no significant diarrhea. Discussed in detail with patient's RN to document type and volume of stool.  Flexi-Seal reportedly fell off yesterday but patient was oozing loose stools.  If has persistent loose stools, may need to get dietitian to change tube feeds.   Body mass index is 29.54 kg/m.  Nutritional Status Nutrition Problem: Inadequate oral intake Etiology: inability to eat(pt sedated and ventilated) Signs/Symptoms: NPO status Interventions: Tube feeding  DVT prophylaxis: SCDs Code Status: DNR Family Communication: None at bedside Disposition: To be determined.?  CIR  versus LTAC.   Consultants:  ID, nephrology, GI, neurology PCCM, Palliative  Procedures:  Chest tube removed, 11/16 Tunneled HD cath, 11/10 HD, 11/3 Tracheostomy 10/30 EGD 10/17. Gastric ulcer, nonbleeding visible vessel clipped CRRT 10/15 Bronchoscopy with endobronchial ballon, 10/15, repeat 10/16 Right-sided chest tube, 10/14 Right pigtail chest tube--for right PTX 10/13-2/14 Right IJ 10/2-10/10 ETT 10/2  Antimicrobials:  None.   Subjective: Patient seen this morning prior to HD.  Indicates no complaints.  As per RN, no acute issues noted.  States that he did receive tube feeds last night and ate 15% of breakfast this morning (may be because he was full from tube feeds).  Objective:  Vitals:   09/13/19 1600 09/13/19 1636 09/13/19 1653 09/13/19 1711  BP: 120/70 111/69  117/70  Pulse: 96 98 92 97  Resp: _0 Temp:  98.4 F (36.9 C)  99.3 F (37.4 C)  TempSrc:  Oral  Oral  SpO2: 100% 100% 100% 100%  Weight:      Height:        Examination:  General exam: Pleasant middle-age male, moderately built and nourished lying comfortably propped up in bed without distress.  Tracheostomy with PMV valve. Respiratory system: Slightly diminished breath sounds in the bases but otherwise clear to auscultation. Cardiovascular system: S1 & S2 heard, RRR. No JVD, murmurs, rubs, gallops or clicks. No pedal edema.  Has left upper extremity dependent edema.  Trace left lower extremity edema.  Telemetry personally reviewed: Sinus rhythm in the 90s-ST in the 100s. Gastrointestinal system: Abdomen is nondistended, soft and nontender. No organomegaly or masses felt. Normal bowel sounds heard. Central nervous system: Alert and responds appropriately to simple questions.  Appears to have aphasia and some dysarthria Extremities: Left-sided hemiplegia with grade 0 x 5 power.  Right upper extremity grade 3 x 5 distally, 2 x 5 proximally.  Right lower extremity grade 2 x 5 power. Skin: No  rashes, lesions or ulcers Psychiatry: Judgement and insight appear impaired. Mood & affect appropriate.     Data Reviewed: I have personally reviewed following labs and imaging studies   CBC: Recent Labs  Lab 09/07/19 0321 09/08/19 0421 09/09/19 1130 09/10/19 0431 09/13/19 1303  WBC 14.4* 13.9* 14.3* 15.1* 15.5*  NEUTROABS  --   --  9.0* 10.6*  --   HGB 8.5* 7.6* 6.9* 8.5* 7.2*  HCT 26.7* 24.0* 21.6* 26.0* 23.2*  MCV 90.5 90.6 91.9 89.3 92.4  PLT 277 264 233 241 676    Basic Metabolic Panel: Recent Labs  Lab 09/07/19 0321 09/09/19 1130 09/09/19 1145 09/10/19 0431 09/13/19 1303  NA 137 133*  --  134* 134*  K 4.1 3.2*  --  3.4* 3.5  CL 95* 92*  --  95* 95*  CO2 26 26  --  26 26  GLUCOSE 98 110*  --  112* 90  BUN 41* 106*  --  44* 60*  CREATININE 2.92* 5.91*  --  3.18* 4.37*  CALCIUM 8.4* 8.5*  --  8.5* 8.5*  MG  --  2.6* 2.4  --   --   PHOS 3.5 4.6  --   --  3.1    Liver Function Tests: Recent Labs  Lab 09/07/19 0321 09/09/19 1130 09/10/19 0431 09/13/19 1303  AST  --  37 35  --   ALT  --  7 6  --   ALKPHOS  --  96 73  --   BILITOT  --  0.6 0.4  --   PROT  --  6.1* 6.6  --   ALBUMIN 1.5* 1.5* 1.9* 1.7*    CBG: Recent Labs  Lab 09/13/19 0009 09/13/19 0415 09/13/19 0736 09/13/19 1124 09/13/19 1709  GLUCAP 105* 87 85 86 74    No results found for this or any previous visit (from the past 240 hour(s)).    Radiology Studies: No results found.        Scheduled Meds: . aspirin  325 mg Per Tube Daily  . atorvastatin  40 mg Oral q1800  . chlorhexidine  15 mL Mouth/Throat BID  . Chlorhexidine Gluconate Cloth  6 each Topical Daily  . Chlorhexidine Gluconate Cloth  6 each Topical Q0600  . Chlorhexidine Gluconate Cloth  6 each Topical Q0600  . darbepoetin (ARANESP) injection - DIALYSIS  100 mcg Intravenous Q Thu-HD  . feeding supplement (NEPRO CARB STEADY)  1,000 mL Oral Q24H  . feeding supplement (PRO-STAT SUGAR FREE 64)  30 mL Per Tube TID   . Gerhardt's butt cream   Topical TID  . heparin injection (subcutaneous)  5,000 Units Subcutaneous Q8H  . mouth rinse  15 mL Mouth Rinse 10 times per day  . multivitamin  1 tablet Oral QHS  . pantoprazole sodium  40 mg Per Tube Daily  . sucralfate  1 g Oral Q6H   Continuous Infusions: . sodium chloride Stopped (09/01/19 0000)  .  ceFAZolin (ANCEF) IV 1 g (09/12/19 2336)  . dextrose Stopped (08/16/19 1623)  . ferric gluconate (FERRLECIT/NULECIT) IV 125 mg (09/13/19 1535)     LOS: 3 days     Vernell Leep, MD, [place], [facility]. [facility]  To contact the attending provider between 7A-7P or the covering provider during after hours 7P-7A, please log into the web site [email or web address] and access using universal  password for that web site. If you do not have the password, please call the hospital operator.  09/13/2019, 5:14 PM

## 2019-09-13 NOTE — Plan of Care (Signed)
  Problem: Education: Goal: Knowledge of risk factors and measures for prevention of condition will improve Outcome: Progressing   Problem: Respiratory: Goal: Will maintain a patent airway Outcome: Progressing Goal: Complications related to the disease process, condition or treatment will be avoided or minimized Outcome: Progressing   Problem: Education: Goal: Knowledge of General Education information will improve Description: Including pain rating scale, medication(s)/side effects and non-pharmacologic comfort measures Outcome: Progressing   Problem: Health Behavior/Discharge Planning: Goal: Ability to manage health-related needs will improve Outcome: Progressing   Problem: Clinical Measurements: Goal: Ability to maintain clinical measurements within normal limits will improve Outcome: Progressing Goal: Will remain free from infection Outcome: Progressing Goal: Diagnostic test results will improve Outcome: Progressing Goal: Respiratory complications will improve Outcome: Progressing Goal: Cardiovascular complication will be avoided Outcome: Progressing   Problem: Activity: Goal: Risk for activity intolerance will decrease Outcome: Progressing   Problem: Nutrition: Goal: Adequate nutrition will be maintained Outcome: Progressing   Problem: Coping: Goal: Level of anxiety will decrease Outcome: Progressing   Problem: Elimination: Goal: Will not experience complications related to bowel motility Outcome: Progressing Goal: Will not experience complications related to urinary retention Outcome: Progressing   Problem: Pain Managment: Goal: General experience of comfort will improve Outcome: Progressing   Problem: Safety: Goal: Ability to remain free from injury will improve Outcome: Progressing   Problem: Skin Integrity: Goal: Risk for impaired skin integrity will decrease Outcome: Progressing   Problem: Intracerebral Hemorrhage Tissue Perfusion: Goal:  Complications of Intracerebral Hemorrhage will be minimized Outcome: Progressing

## 2019-09-13 NOTE — Progress Notes (Signed)
Notify provider hr [id number], stable rest in bed, cough occasion. Will follow up new order.

## 2019-09-13 NOTE — Progress Notes (Signed)
Trach care done per RRT, patient tolerated it well no distress or complications noted.

## 2019-09-13 NOTE — Progress Notes (Signed)
[id number] patient back on units from dialysis

## 2019-09-14 DIAGNOSIS — N186 End stage renal disease: Secondary | ICD-10-CM | POA: Diagnosis not present

## 2019-09-14 DIAGNOSIS — Z992 Dependence on renal dialysis: Secondary | ICD-10-CM | POA: Diagnosis not present

## 2019-09-14 DIAGNOSIS — J9601 Acute respiratory failure with hypoxia: Secondary | ICD-10-CM | POA: Diagnosis not present

## 2019-09-14 LAB — CBC
HCT: 27.6 % — ABNORMAL LOW (ref 39.0–52.0)
Hemoglobin: 8.6 g/dL — ABNORMAL LOW (ref 13.0–17.0)
MCH: 29.3 pg (ref 26.0–34.0)
MCHC: 31.2 g/dL (ref 30.0–36.0)
MCV: 93.9 fL (ref 80.0–100.0)
Platelets: 295 10*3/uL (ref 150–400)
RBC: 2.94 MIL/uL — ABNORMAL LOW (ref 4.22–5.81)
RDW: 14.3 % (ref 11.5–15.5)
WBC: 12.2 10*3/uL — ABNORMAL HIGH (ref 4.0–10.5)
nRBC: 0.5 % — ABNORMAL HIGH (ref 0.0–0.2)

## 2019-09-14 LAB — GLUCOSE, CAPILLARY
Glucose-Capillary: 82 mg/dL (ref 70–99)
Glucose-Capillary: 85 mg/dL (ref 70–99)
Glucose-Capillary: 83 mg/dL (ref 70–99)
Glucose-Capillary: 85 mg/dL (ref 70–99)
Glucose-Capillary: 94 mg/dL (ref 70–99)

## 2019-09-14 MED ORDER — WHITE PETROLATUM EX OINT
1.0000 "application " | TOPICAL_OINTMENT | CUTANEOUS | Status: DC | PRN
  Filled 2019-09-14 (×2): qty 28.35

## 2019-09-14 MED ORDER — CHLORHEXIDINE GLUCONATE CLOTH 2 % EX PADS
6.0000 | MEDICATED_PAD | Freq: Every day | CUTANEOUS | Status: DC
  Administered 2019-09-15 – 2019-09-22 (×7): 6 via TOPICAL

## 2019-09-14 NOTE — Progress Notes (Signed)
PROGRESS NOTE   [name]  WVP:[id number]    DOB: [birth year]    DOA: 07/25/2019  PCP: Kathyrn Drown, MD   I have briefly reviewed patients previous medical records in [facility].  Chief Complaint  Patient presents with  . Shortness of Breath    Brief Narrative:  68 year old male with PMH of GERD and BPH, initially presented on 07/25/2019 with worsening dyspnea after recently testing positive for COVID-19 on 07/22/2019.  He required emergent intubation at [facility] ED.  He was admitted to the [facility] hospital for acute hypoxic respiratory failure secondary to severe ARDS from Covid pneumonia and completed Decadron, Remdesivir and Tocilizumab.  Hospital course complicated by acute kidney injury requiring CRRT, encephalopathy, pneumothorax complicated by persistent air leak and bronchopleural fistula requiring endobronchial blocker and pleur-evac, prolonged mechanical ventilation s/p tracheostomy on 10/30, MSSA bacteremia with negative echocardiogram intermittent pressor support with dialysis, melena with acute blood loss anemia requiring transfusion, IV PPI, empiric octreotide, EGD showed gastric ulcer and nonbleeding visible vessel clipped, transferred to [facility] on 08/26/2023 intermittent HD, subacute right cerebral/cerebellar infarcts on MRI.  PCCM following for tracheostomy management, weekly.  Nephrology on board for HD needs.  VVS plans permanent dialysis access after patient improves overall.  Assessment & Plan:   Principal Problem:   Pneumonia due to COVID-19 virus Active Problems:   BPH (benign prostatic hyperplasia)   Acute respiratory failure due to COVID-19 (HCC)   AKI (acute kidney injury) (HCC)   Fluid overload   Pneumothorax on right   Acute renal failure (ARF) (HCC)   GI bleed   Atrial fibrillation with RVR (HCC)   Constipation   Gastric ulcer with hemorrhage   Acute respiratory failure (HCC)   Chest tube in place   Primary spontaneous  pneumothorax   Acute respiratory distress syndrome (ARDS) due to COVID-19 virus ([place])   Tracheostomy status ([place])   Cerebral thrombosis with cerebral infarction   Advanced care planning/counseling discussion   Goals of care, counseling/discussion   Palliative care by specialist   Sinus tachycardia   Acute blood loss anemia   H. pylori infection   CVA (cerebral vascular accident) ([place])   Anoxic brain injury ([place])   MSSA bacteremia   Positive D dimer   Pseudomonas infection   Infection due to acinetobacter baumannii   Acute hypoxic respiratory failure secondary to severe ARDS from Covid pneumonia.  Completed remdesivir/Decadron/Actemra/convalescent plasma therapy.  ARDS resolved.  Covid testing negative x2 on 11/1 and 11/2.  Stable on trach collar, management per PCCM.  Continue trach collar at 28%.  Using Passy-Muir valve.  Tracheostomy downsized 09/07/2021.  PCCM to follow weekly or as needed.  S/p chest tube due to pneumothorax, removed 09/08/2019.  TCTS signed off 11/17.  Stable.  ESRD  Started off as acute kidney injury complicating stage III CKD.  Has now progressed to ESRD.  Nephrology continues to assist.  ESRD presumed as he has been HD dependent for over a month without signs of renal recovery.  Ongoing TTS schedule, s/p HD 11/21.  Next HD 11/23, Thanksgiving holiday schedule.  Currently dialyzed through TTS, vascular surgery recommend staged procedure for aVF and would like to wait further until patient gets stronger from his debility.  He is not appropriate for SNF because unable to sit for outpatient HD.  Only options would be LTAC or CIR.  Septic shock, resolved  Right pneumothorax/empyema/necrotic right lower lobe/bronchopleural fistula with large air leak  Chest tube removed by CT  surgery on 11/16.  Periodic chest x-ray follow-up.  Appears stable for now.  Chest x-ray 11/17: Unchanged small right basal lateral pneumothorax.  Acute on chronic anemia   Secondary to gastric ulcer, s/p EGD with culprit vessel clipping.  S/p PRBC transfusion 1 unit on 11/13.  Hemoglobin had dropped again to 6.9 on 11/17.  Transfused second unit PRBC on 11/17.  Hemoglobin up to 8.5 on 11/18.  Hb has dropped to 7.2 again on 11/21.  No overt bleeding/GI bleed.  Hemoglobin up to 8.6 today.  Continue IV ferric gluconate and Aranesp.  H. pylori positive by EGD biopsy   GI recommends waiting until clinically stable after discharge and starting 2 weeks regimen to prevent ulcer recurrence. GI (per Dr. Cristina Gong 10/27) to follow up in 2-3 months from hospitalization to arrange this and possible f/u EGD.   CVA  Small bilateral cerebral/right cerebellum infarcts 11/3 likely due to hypotension during dialysis, sepsis and deconditioning.  PAF is also potential etiology.  Mental status continues to gradually improve.  Has residual left hemiplegia, dysphagia.  Neurology recommends aspirin 325 mg daily, permissive hypertension.  No anticoagulation given history of GI bleed.  Follow-up in 4 weeks after discharge at stroke clinic NP/GNA.  Continue therapies evaluation and will need to determine disposition i.e. CIR versus LTAC.   Dysphagia  Has right nasal core track.  Speech therapy input appreciated, advancing diet.  Hopefully can tolerate enough to DC core track at some point soon.  Otherwise may need to consider PEG.  Speech therapy follow-up appreciated.  Have upgraded to dysphagia 2 diet and nectar thickened liquids and hopeful for removal of core track which may contribute to cough response.  Anoxic brain injury  As per neurology MRI brain 11/3 consistent with anoxic brain injury.  Seems to be coherent and responds to questions appropriately.  Atrial fibrillation, paroxysmal  Currently in sinus rhythm.  Briefly required amiodarone earlier in the hospitalization.  Continue aspirin.  Not anticoagulation candidate due to GI bleed.  Discontinued telemetry  11/22.  MSSA bacteremia  TTE negative but still high concern for infective endocarditis and hence continue cefazolin until end date of 11/23 i.e. total of 6 weeks.  Pseudomonas and Acinetobacter in BAL  Resolved.  Completed 10 days course of meropenem.  Elevated D-dimer  No full anticoagulation with GI bleed.  Dopplers negative for DVT on 10/8.  Likely related to acute illness/acute infectious etiology.  Pressure Injury 08/24/19 Sacrum Right;Left;Mid Stage II -  Partial thickness loss of dermis presenting as a shallow open ulcer with a red, pink wound bed without slough.  (Active)  08/24/19 0800  Location: Sacrum  Location Orientation: Right;Left;Mid  Staging: Stage II -  Partial thickness loss of dermis presenting as a shallow open ulcer with a red, pink wound bed without slough.  Wound Description (Comments): -- (stage 11 mid sacrum)  Present on Admission: No    Goals of care Guarded prognosis.  Family wish to pursue full scope of treatment.  Palliative medicine assisted with GOC.  Diarrhea Unclear etiology.  Seems to have resolved.  As per discussion with RN, no stool noted for 2 days in a row.  None charted either.  Discontinue Flexi-Seal and monitor.   Body mass index is 29.73 kg/m.  Nutritional Status Nutrition Problem: Inadequate oral intake Etiology: inability to eat(pt sedated and ventilated) Signs/Symptoms: NPO status Interventions: Tube feeding  DVT prophylaxis: SCDs Code Status: DNR Family Communication: None at bedside Disposition: To be determined.?  CIR versus LTAC.  Consultants:  ID, nephrology, GI, neurology PCCM, Palliative  Procedures:  Chest tube removed, 11/16 Tunneled HD cath, 11/10 HD, 11/3 Tracheostomy 10/30 EGD 10/17. Gastric ulcer, nonbleeding visible vessel clipped CRRT 10/15 Bronchoscopy with endobronchial ballon, 10/15, repeat 10/16 Right-sided chest tube, 10/14 Right pigtail chest tube--for right PTX 10/13-2/14 Right IJ  10/2-10/10 ETT 10/2  Antimicrobials:  None.   Subjective: Reports some back pain and wants to be turned.  "I am okay".  Specifically denies any complaints.  As per RN, no acute issues noted.  Objective:  Vitals:   09/14/19 0500 09/14/19 0735 09/14/19 1100 09/14/19 1150  BP:    123/82  Pulse: (!) 101 94 92 96  Resp: _0 (!) 22  Temp:    99.7 F (37.6 C)  TempSrc:    Oral  SpO2: 95% 97% 96% 97%  Weight:      Height:        Examination:  General exam: Pleasant middle-age male, moderately built and nourished lying comfortably propped up in bed without distress.  Tracheostomy with PMV valve. Respiratory system: Slightly diminished breath sounds in the bases but otherwise clear to auscultation. Cardiovascular system: S1 & S2 heard, RRR. No JVD, murmurs, rubs, gallops or clicks. No pedal edema.  Has left upper extremity dependent edema, improving.  Trace left lower extremity edema.  Telemetry personally reviewed: Sinus rhythm. Gastrointestinal system: Abdomen is nondistended, soft and nontender. No organomegaly or masses felt. Normal bowel sounds heard. Central nervous system: Alert and oriented to self.  Responds appropriately to simple questions or instructions.  Appears to have aphasia and some dysarthria Extremities: Left-sided hemiplegia with grade 0 x 5 power.  Right upper extremity grade 3 x 5 distally, 2 x 5 proximally.  Right lower extremity grade 2 x 5 power. Skin: No rashes, lesions or ulcers Psychiatry: Judgement and insight appear impaired. Mood & affect appropriate.     Data Reviewed: I have personally reviewed following labs and imaging studies   CBC: Recent Labs  Lab 09/08/19 0421 09/09/19 1130 09/10/19 0431 09/13/19 1303 09/14/19 0401  WBC 13.9* 14.3* 15.1* 15.5* 12.2*  NEUTROABS  --  9.0* 10.6*  --   --   HGB 7.6* 6.9* 8.5* 7.2* 8.6*  HCT 24.0* 21.6* 26.0* 23.2* 27.6*  MCV 90.6 91.9 89.3 92.4 93.9  PLT 264 233 241 266 585    Basic Metabolic  Panel: Recent Labs  Lab 09/09/19 1130 09/09/19 1145 09/10/19 0431 09/13/19 1303  NA 133*  --  134* 134*  K 3.2*  --  3.4* 3.5  CL 92*  --  95* 95*  CO2 26  --  26 26  GLUCOSE 110*  --  112* 90  BUN 106*  --  44* 60*  CREATININE 5.91*  --  3.18* 4.37*  CALCIUM 8.5*  --  8.5* 8.5*  MG 2.6* 2.4  --   --   PHOS 4.6  --   --  3.1    Liver Function Tests: Recent Labs  Lab 09/09/19 1130 09/10/19 0431 09/13/19 1303  AST 37 35  --   ALT 7 6  --   ALKPHOS 96 73  --   BILITOT 0.6 0.4  --   PROT 6.1* 6.6  --   ALBUMIN 1.5* 1.9* 1.7*    CBG: Recent Labs  Lab 09/13/19 2312 09/13/19 2342 09/14/19 0326 09/14/19 0731 09/14/19 1147  GLUCAP 82 88 82 85 83    No results found for this or any previous visit (from  the past 240 hour(s)).    Radiology Studies: No results found.        Scheduled Meds: . aspirin  325 mg Per Tube Daily  . atorvastatin  40 mg Oral q1800  . chlorhexidine  15 mL Mouth/Throat BID  . Chlorhexidine Gluconate Cloth  6 each Topical Q0600  . Chlorhexidine Gluconate Cloth  6 each Topical Q0600  . darbepoetin (ARANESP) injection - DIALYSIS  100 mcg Intravenous Q Thu-HD  . feeding supplement (PRO-STAT SUGAR FREE 64)  30 mL Per Tube TID  . Gerhardt's butt cream   Topical TID  . heparin injection (subcutaneous)  5,000 Units Subcutaneous Q8H  . mouth rinse  15 mL Mouth Rinse 10 times per day  . multivitamin  1 tablet Oral QHS  . pantoprazole sodium  40 mg Per Tube Daily  . sucralfate  1 g Oral Q6H   Continuous Infusions: . sodium chloride Stopped (09/01/19 0000)  .  ceFAZolin (ANCEF) IV 1 g (09/14/19 0043)  . dextrose Stopped (08/16/19 1623)     LOS: 33 days     Vernell Leep, MD, [place], [facility]. [facility]  To contact the attending provider between 7A-7P or the covering provider during after hours 7P-7A, please log into the web site [email or web address] and access using universal [place] password for that web site. If you do not have the  password, please call the hospital operator.  09/14/2019, 2:01 PM

## 2019-09-14 NOTE — Progress Notes (Signed)
Patient's trach care has been done and he is currently on his PMV.  VS charted and stable.

## 2019-09-14 NOTE — Progress Notes (Signed)
  Speech Language Pathology Treatment: Dysphagia;Cognitive-Linquistic  Patient Details Name: [name] MRN: [id number] DOB: 04/21/1951 Today's Date: 09/14/2019 Time: 0626-9485 SLP Time Calculation (min) (ACUTE ONLY): 60 min  Assessment / Plan / Recommendation Clinical Impression  Pt seen with am meal, PMSV in place. Coughing at baseline and throughout meal despite very careful administration methods and perfect positioning. Pt consumed an adequate amount of meal via total assist and some hand over hand assist. He does not initiate and remains very quiet unless constantly questioned. He still relies on slight gestures to communicate wants and needs despite ability to verbalize. When pushed pt can participate in conversation and make requests. He is not interested in food but will accept it. Will upgrade solids to dys 2 for more palatable options and also proceed with nectar thick liquids given very good tolerance of thin on MBS despite unrelated coughing. Hopeful for removal of cortrak which may be contributing to cough response.    HPI HPI: 68 year old with a history of GERD and BPH who presented to [facility], ED with S OB and was found to be hypoxic.  He was initially diagnosed with COVID-19 September 20.  1 to 2 days prior to his presentation he developed worsening shortness of breath with anorexia. Admitted on 10/3, intubated.  EGD on 10/17 showed normal esophagus but excessive gastric fluid. CT scan of the chest on 10/21 showed findings consistent with an empyema as well as persistent pneumothorax.  Bedside bronchoscopy revealed material in the airway. Trached on 10/30. On ATC by 10/31.Additionally MRI on 11/4 showed There is cortical restricted diffusion posteriorly in the right frontal lobe consistent with acute infarct. Additional smaller regions of diffusion abnormality are present in the posterior left frontal lobe, anterior left frontal lobe, and right greater than left medial parietal  lobes as well as right cerebellum.       SLP Plan  Continue with current plan of care       Recommendations  Diet recommendations: Dysphagia 2 (fine chop);Nectar-thick liquid Liquids provided via: Cup;Straw Medication Administration: Crushed with puree Supervision: Staff to assist with self feeding;Full supervision/cueing for compensatory strategies Compensations: Slow rate;Small sips/bites Postural Changes and/or Swallow Maneuvers: Seated upright 90 degrees;Upright 30-60 min after meal      Patient may use Passy-Muir Speech Valve: Intermittently with supervision;During all therapies with supervision;During PO intake/meals PMSV Supervision: Intermittent         Plan: Continue with current plan of care       GO                Jailene Cupit, Katherene Ponto 09/14/2019, 10:36 AM

## 2019-09-14 NOTE — Progress Notes (Signed)
Subjective:  No significant change overnight,  HD yest- removed 2500- tolerated well - of note pre HD BUN and crt a little better ?  But no UOP recorded  Objective Vital signs in last 24 hours: Vitals:   09/14/19 0338 09/14/19 0341 09/14/19 0500 09/14/19 0735  BP:  (!) 144/84    Pulse:  89 (!) 101 94  Resp:   20 18  Temp:  98.1 F (36.7 C)    TempSrc:  Oral    SpO2:  100% 95% 97%  Weight: 94 kg     Height:       Weight change: 0.6 kg  Intake/Output Summary (Last 24 hours) at 09/14/2019 9147 Last data filed at 09/13/2019 1636 Gross per 24 hour  Intake -  Output 2500 ml  Net -2500 ml    Assessment/ Plan: Pt is a 68 y.o. yo male with CKD who was admitted on 07/25/2019 with COVID pna-  Has progressed to being HD req since 10/15  Assessment/Plan: 1. Pulm-  Chest tube out.  Still with trach-  Being downsized- no abx-  Per CCM 2. ESRD- presumed as has been HD dep for 5 weeks without any current signs of renal recovery- pre HD BUN and crt of 60 and 4.7 yest which was an improvement but no UOP.  Have been doing HD on a TTS schedule here,   Has TDC.  Vein mapping done-  Will likely needs staged procedure for AVF-  Want to wait for patient to get stronger.  Running on holiday schedule this week for Thanksgiving-  For TTS is MWSat-  so next HD tomorrow  3. Anemia- has been low- also with GIB- no hep with HD- on ESA-   iron stores low, will replete as well- supportive care  - transfused one unit on 11/17- last hgb 8.6 4. Secondary hyperparathyroidism- phos is OK - no binders 5. HTN/volume- no scheduled BP meds- trying to challenge volume with success  6. Dispo-  Cannot do OP HD with trach, also is extremely debilitated- cannot turn in bed, cant sit up for OP HD - being told that LTAC is not appropriate due to trach but maybe due to this issue of debility- not able to do OP HD or get AVF at this time- still too debilitated    [name]    Labs: Basic Metabolic Panel: Recent  Labs  Lab 09/09/19 1130 09/10/19 0431 09/13/19 1303  NA 133* 134* 134*  K 3.2* 3.4* 3.5  CL 92* 95* 95*  CO2 26 26 26   GLUCOSE 110* 112* 90  BUN 106* 44* 60*  CREATININE 5.91* 3.18* 4.37*  CALCIUM 8.5* 8.5* 8.5*  PHOS 4.6  --  3.1   Liver Function Tests: Recent Labs  Lab 09/09/19 1130 09/10/19 0431 09/13/19 1303  AST 37 35  --   ALT 7 6  --   ALKPHOS 96 73  --   BILITOT 0.6 0.4  --   PROT 6.1* 6.6  --   ALBUMIN 1.5* 1.9* 1.7*   No results for input(s): LIPASE, AMYLASE in the last 168 hours. No results for input(s): AMMONIA in the last 168 hours. CBC: Recent Labs  Lab 09/08/19 0421 09/09/19 1130 09/10/19 0431 09/13/19 1303 09/14/19 0401  WBC 13.9* 14.3* 15.1* 15.5* 12.2*  NEUTROABS  --  9.0* 10.6*  --   --   HGB 7.6* 6.9* 8.5* 7.2* 8.6*  HCT 24.0* 21.6* 26.0* 23.2* 27.6*  MCV 90.6 91.9 89.3 92.4 93.9  PLT 264 233 241 266 295   Cardiac Enzymes: No results for input(s): CKTOTAL, CKMB, CKMBINDEX, TROPONINI in the last 168 hours. CBG: Recent Labs  Lab 09/13/19 1709 09/13/19 2126 09/13/19 2312 09/13/19 2342 09/14/19 0326  GLUCAP 74 78 82 88 82    Iron Studies:  No results for input(s): IRON, TIBC, TRANSFERRIN, FERRITIN in the last 72 hours. Studies/Results: No results found. Medications: Infusions: . sodium chloride Stopped (09/01/19 0000)  .  ceFAZolin (ANCEF) IV 1 g (09/14/19 0043)  . dextrose Stopped (08/16/19 1623)  . ferric gluconate (FERRLECIT/NULECIT) IV Stopped (09/13/19 1800)    Scheduled Medications: . aspirin  325 mg Per Tube Daily  . atorvastatin  40 mg Oral q1800  . chlorhexidine  15 mL Mouth/Throat BID  . Chlorhexidine Gluconate Cloth  6 each Topical Daily  . Chlorhexidine Gluconate Cloth  6 each Topical Q0600  . Chlorhexidine Gluconate Cloth  6 each Topical Q0600  . darbepoetin (ARANESP) injection - DIALYSIS  100 mcg Intravenous Q Thu-HD  . feeding supplement (PRO-STAT SUGAR FREE 64)  30 mL Per Tube TID  . Gerhardt's butt cream    Topical TID  . heparin injection (subcutaneous)  5,000 Units Subcutaneous Q8H  . mouth rinse  15 mL Mouth Rinse 10 times per day  . multivitamin  1 tablet Oral QHS  . pantoprazole sodium  40 mg Per Tube Daily  . sucralfate  1 g Oral Q6H    have reviewed scheduled and prn medications.  Physical Exam: General: seems more alert-  Verbalizing his needs but physically very limited- I asked if he made urine "not today'  Did you make any yesterday ?  "I dont remember " Heart: RRR Lungs: mostly clear Abdomen: soft, non tender Extremities: pitting edema but much better-  bilat boots  Dialysis Access: left TDC     09/14/2019,9:04 AM  LOS: 51 days

## 2019-09-15 DIAGNOSIS — Z992 Dependence on renal dialysis: Secondary | ICD-10-CM | POA: Diagnosis not present

## 2019-09-15 DIAGNOSIS — J9601 Acute respiratory failure with hypoxia: Secondary | ICD-10-CM | POA: Diagnosis not present

## 2019-09-15 DIAGNOSIS — N186 End stage renal disease: Secondary | ICD-10-CM | POA: Diagnosis not present

## 2019-09-15 DIAGNOSIS — J1289 Other viral pneumonia: Secondary | ICD-10-CM | POA: Diagnosis not present

## 2019-09-15 DIAGNOSIS — J9611 Chronic respiratory failure with hypoxia: Secondary | ICD-10-CM | POA: Diagnosis not present

## 2019-09-15 DIAGNOSIS — Z930 Tracheostomy status: Secondary | ICD-10-CM | POA: Diagnosis not present

## 2019-09-15 DIAGNOSIS — U071 COVID-19: Secondary | ICD-10-CM | POA: Diagnosis not present

## 2019-09-15 DIAGNOSIS — R5381 Other malaise: Secondary | ICD-10-CM

## 2019-09-15 LAB — GLUCOSE, CAPILLARY
Glucose-Capillary: 78 mg/dL (ref 70–99)
Glucose-Capillary: 85 mg/dL (ref 70–99)
Glucose-Capillary: 85 mg/dL (ref 70–99)
Glucose-Capillary: 90 mg/dL (ref 70–99)

## 2019-09-15 MED ORDER — ALBUMIN HUMAN 25 % IV SOLN
INTRAVENOUS | Status: AC
  Filled 2019-09-15: qty 100

## 2019-09-15 MED ORDER — HEPARIN SODIUM (PORCINE) 1000 UNIT/ML IJ SOLN
INTRAMUSCULAR | Status: AC
  Filled 2019-09-15: qty 4

## 2019-09-15 MED ORDER — NEPRO/CARBSTEADY PO LIQD
1000.0000 mL | ORAL | Status: DC
  Administered 2019-09-15: 1000 mL via ORAL
  Filled 2019-09-15 (×15): qty 1000

## 2019-09-15 MED ORDER — ONDANSETRON HCL 4 MG/2ML IJ SOLN: 4.0000 mg | Freq: Four times a day (QID) | INTRAMUSCULAR | Status: DC | PRN

## 2019-09-15 NOTE — Progress Notes (Signed)
Trach care done per RRT, patient tolerated it well no distress or complications noted. New ATC set changed.

## 2019-09-15 NOTE — Progress Notes (Addendum)
PROGRESS NOTE   Day Deery  EPP:[id number]    DOB: 16-Jul-1951    DOA: 07/25/2019  PCP: Kathyrn Drown, MD   I have briefly reviewed patients previous medical records in [facility].  Chief Complaint  Patient presents with  . Shortness of Breath    Brief Narrative:  68 year old male with PMH of GERD and BPH, initially presented on 07/25/2019 with worsening dyspnea after recently testing positive for COVID-19 on 07/22/2019.  He required emergent intubation at [facility] ED.  He was admitted to the [facility] hospital for acute hypoxic respiratory failure secondary to severe ARDS from Covid pneumonia and completed Decadron, Remdesivir and Tocilizumab.  Hospital course complicated by acute kidney injury requiring CRRT, encephalopathy, pneumothorax complicated by persistent air leak and bronchopleural fistula requiring endobronchial blocker and pleur-evac, prolonged mechanical ventilation s/p tracheostomy on 10/30, MSSA bacteremia with negative echocardiogram intermittent pressor support with dialysis, melena with acute blood loss anemia requiring transfusion, IV PPI, empiric octreotide, EGD showed gastric ulcer and nonbleeding visible vessel clipped, transferred to [facility] on 08/26/2023 intermittent HD, subacute right cerebral/cerebellar infarcts on MRI.  PCCM following for tracheostomy management, weekly.  Nephrology on board for HD needs.  VVS plans permanent dialysis access after patient improves overall.  Assessment & Plan:   Principal Problem:   Pneumonia due to COVID-19 virus Active Problems:   BPH (benign prostatic hyperplasia)   Acute respiratory failure due to COVID-19 (HCC)   AKI (acute kidney injury) (HCC)   Fluid overload   Pneumothorax on right   Acute renal failure (ARF) (HCC)   GI bleed   Atrial fibrillation with RVR (HCC)   Constipation   Gastric ulcer with hemorrhage   Acute respiratory failure (HCC)   Chest tube in place   Primary spontaneous  pneumothorax   Acute respiratory distress syndrome (ARDS) due to COVID-19 virus ([place])   Tracheostomy status ([place])   Cerebral thrombosis with cerebral infarction   Advanced care planning/counseling discussion   Goals of care, counseling/discussion   Palliative care by specialist   Sinus tachycardia   Acute blood loss anemia   H. pylori infection   CVA (cerebral vascular accident) ([place])   Anoxic brain injury ([place])   MSSA bacteremia   Positive D dimer   Pseudomonas infection   Infection due to acinetobacter baumannii   Acute hypoxic respiratory failure secondary to severe ARDS from Covid pneumonia, s/p tracheostomy  Completed remdesivir/Decadron/Actemra/convalescent plasma therapy.  ARDS resolved.  Covid testing negative x2 on 11/1 and 11/2.  Stable on trach collar, management per PCCM.  Continue trach collar at 28%.  Using Passy-Muir valve.  Tracheostomy downsized 09/07/2021.  PCCM to following weekly> also would call as needed. PCCM follow up from today appreciated.  S/p chest tube due to pneumothorax, removed 09/08/2019.  TCTS signed off 11/17.  Patient has intermittent thick viscid tracheostomy secretions, continue aggressive pulmonary toilet, per CCM maintain trach size to 6 cuffless due to secretions and debility.  ESRD on hemodialysis  Started off as acute kidney injury complicating stage III CKD.  Has now progressed to ESRD.  Nephrology continues to assist.  ESRD presumed as he has been HD dependent for over a month without signs of renal recovery.  Ongoing TTS schedule, s/p HD 11/21.  Next HD 11/23, Thanksgiving holiday schedule.  Currently dialyzed through TTS, vascular surgery recommend staged procedure for aVF and would like to wait further until patient gets stronger from his debility.  He is not appropriate for SNF because  unable to sit for outpatient HD.  Only options would be LTAC or CIR.  Septic shock, resolved  Right pneumothorax/empyema/necrotic right  lower lobe/bronchopleural fistula with large air leak  Chest tube removed by CT surgery on 11/16.  Periodic chest x-ray follow-up.  Appears stable for now.  Chest x-ray 11/17: Unchanged small right basal lateral pneumothorax.  Acute on chronic anemia  Secondary to gastric ulcer, s/p EGD with culprit vessel clipping.  Appears that he has received 2 units PRBC transfusion thus far.  Last last PRBC transfusion on 11/17.  Hemoglobin 8.6.  Follow CBCs closely across dialysis.  No overt GI bleeding.  Continue IV ferric gluconate and Aranesp.  H. pylori positive by EGD biopsy   GI recommends waiting until clinically stable after discharge and starting 2 weeks regimen to prevent ulcer recurrence. GI (per Dr. Cristina Gong 10/27) to follow up in 2-3 months from hospitalization to arrange this and possible f/u EGD.   CVA  Small bilateral cerebral/right cerebellum infarcts 11/3 likely due to hypotension during dialysis, sepsis and deconditioning.  PAF is also potential etiology.  Mental status continues to gradually improve.  Has residual left hemiplegia, dysphagia.  Neurology recommends aspirin 325 mg daily, permissive hypertension.  No anticoagulation given history of GI bleed.  Follow-up in 4 weeks after discharge at stroke clinic NP/GNA.  Continue therapies evaluation and will need to determine disposition i.e. CIR versus LTAC.   Dysphagia  Has right nasal core track.    Speech therapy follow-up appreciated.  Have upgraded to dysphagia 2 diet and nectar thickened liquids.  Discussed with dietitian on 11/17.  Reportedly not eating adequately yet.  She would like to repeat calorie count again, resume tube feeds at night and reassess in 48 hours.  If ongoing poor oral intake, will need to discuss with family regarding PEG tube.  Anoxic brain injury  As per neurology MRI brain 11/3 consistent with anoxic brain injury.  Seems to be coherent and responds to questions appropriately.  Atrial  fibrillation, paroxysmal  Currently in sinus rhythm.  Briefly required amiodarone earlier in the hospitalization.  Continue aspirin.  Not anticoagulation candidate due to GI bleed.  Discontinued telemetry 11/22.  MSSA bacteremia  TTE negative but still high concern for infective endocarditis and hence continue cefazolin until end date of 11/23 i.e. total of 6 weeks.  Pseudomonas and Acinetobacter in BAL  Resolved.  Completed 10 days course of meropenem.  Elevated D-dimer  No full anticoagulation with GI bleed.  Dopplers negative for DVT on 10/8.  Likely related to acute illness/acute infectious etiology.  Pressure Injury 08/24/19 Sacrum Right;Left;Mid Stage II -  Partial thickness loss of dermis presenting as a shallow open ulcer with a red, pink wound bed without slough.  (Active)  08/24/19 0800  Location: Sacrum  Location Orientation: Right;Left;Mid  Staging: Stage II -  Partial thickness loss of dermis presenting as a shallow open ulcer with a red, pink wound bed without slough.  Wound Description (Comments): -- (stage 11 mid sacrum)  Present on Admission: No    Goals of care Guarded prognosis.  Family wish to pursue full scope of treatment.  Palliative medicine assisted with GOC.  Diarrhea Unclear etiology.  Resolved.  Flexi-Seal removed over the weekend.  Severe debility  Patient seen this morning with rehab team at bedside.  They are evaluating him and communicating with family to see if he is appropriate for CIR.  Adult failure to thrive   Body mass index is 29.61 kg/m.  Nutritional  Status Nutrition Problem: Increased nutrient needs Etiology: chronic illness Signs/Symptoms: estimated needs Interventions: Magic cup, MVI, Tube feeding, Calorie Count, Refer to RD note for recommendations  DVT prophylaxis: SCDs Code Status: DNR Family Communication: None at bedside. I discussed with patient's spouse, updated care and answered questions. Disposition: To be  determined.?  CIR versus LTAC.   Consultants:  ID, nephrology, GI, neurology PCCM, Palliative, rehab  Procedures:  Chest tube removed, 11/16 Tunneled HD cath, 11/10 HD, 11/3 Tracheostomy 10/30 EGD 10/17. Gastric ulcer, nonbleeding visible vessel clipped CRRT 10/15 Bronchoscopy with endobronchial ballon, 10/15, repeat 10/16 Right-sided chest tube, 10/14 Right pigtail chest tube--for right PTX 10/13-2/14 Right IJ 10/2-10/10 ETT 10/2  Antimicrobials:  None.   Subjective: Patient seen this morning along with rehab team at bedside.  Denies complaints.  As per rehab, thick viscid secretions from tracheostomy.  Objective:  Vitals:   09/15/19 0804 09/15/19 1128 09/15/19 1330 09/15/19 1642  BP: 135/80   136/72  Pulse: 91 90  96  Resp: _0 Temp: 98.7 F (37.1 C)   98.6 F (37 C)  TempSrc: Oral   Oral  SpO2: 99% 98% 97% 100%  Weight:      Height:        Examination:  General exam: Pleasant middle-age male, moderately built and nourished lying comfortably propped up in bed without distress.  Tracheostomy open to room air.  Minimal thick viscid creamy secretions.Marland Kitchen Respiratory system: Clear to auscultation anteriorly.  Slightly diminished at bases.  No increased work of breathing. Cardiovascular system: S1 & S2 heard, RRR. No JVD, murmurs, rubs, gallops or clicks. No pedal edema.  Has left upper extremity dependent edema, improving.  Trace left lower extremity edema.  Stable. Gastrointestinal system: Abdomen is nondistended, soft and nontender. No organomegaly or masses felt. Normal bowel sounds heard. Central nervous system: Alert and oriented to self.  Responds appropriately to simple questions or instructions.  Appears to have aphasia and some dysarthria Extremities: Left-sided hemiplegia with grade 0 x 5 power.  Right upper extremity grade 3 x 5 distally, 2 x 5 proximally.  Right lower extremity grade 2 x 5 power. Skin: No rashes, lesions or ulcers Psychiatry:  Judgement and insight appear impaired. Mood & affect appropriate.     Data Reviewed: I have personally reviewed following labs and imaging studies   CBC: Recent Labs  Lab 09/09/19 1130 09/10/19 0431 09/13/19 1303 09/14/19 0401  WBC 14.3* 15.1* 15.5* 12.2*  NEUTROABS 9.0* 10.6*  --   --   HGB 6.9* 8.5* 7.2* 8.6*  HCT 21.6* 26.0* 23.2* 27.6*  MCV 91.9 89.3 92.4 93.9  PLT 233 241 266 161    Basic Metabolic Panel: Recent Labs  Lab 09/09/19 1130 09/09/19 1145 09/10/19 0431 09/13/19 1303  NA 133*  --  134* 134*  K 3.2*  --  3.4* 3.5  CL 92*  --  95* 95*  CO2 26  --  26 26  GLUCOSE 110*  --  112* 90  BUN 106*  --  44* 60*  CREATININE 5.91*  --  3.18* 4.37*  CALCIUM 8.5*  --  8.5* 8.5*  MG 2.6* 2.4  --   --   PHOS 4.6  --   --  3.1    Liver Function Tests: Recent Labs  Lab 09/09/19 1130 09/10/19 0431 09/13/19 1303  AST 37 35  --   ALT 7 6  --   ALKPHOS 96 73  --   BILITOT 0.6 0.4  --  PROT 6.1* 6.6  --   ALBUMIN 1.5* 1.9* 1.7*    CBG: Recent Labs  Lab 09/14/19 1147 09/14/19 1616 09/14/19 1922 09/15/19 0001 09/15/19 0358  GLUCAP 83 85 94 90 78    No results found for this or any previous visit (from the past 240 hour(s)).    Radiology Studies: No results found.        Scheduled Meds: . aspirin  325 mg Per Tube Daily  . atorvastatin  40 mg Oral q1800  . chlorhexidine  15 mL Mouth/Throat BID  . Chlorhexidine Gluconate Cloth  6 each Topical Q0600  . Chlorhexidine Gluconate Cloth  6 each Topical Q0600  . darbepoetin (ARANESP) injection - DIALYSIS  100 mcg Intravenous Q Thu-HD  . heparin      . heparin injection (subcutaneous)  5,000 Units Subcutaneous Q8H  . mouth rinse  15 mL Mouth Rinse 10 times per day  . multivitamin  1 tablet Oral QHS  . pantoprazole sodium  40 mg Per Tube Daily  . sucralfate  1 g Oral Q6H   Continuous Infusions: . sodium chloride Stopped (09/01/19 0000)  . albumin human    .  ceFAZolin (ANCEF) IV 1 g (09/15/19  0118)  . dextrose Stopped (08/16/19 1623)  . feeding supplement (NEPRO CARB STEADY)       LOS: 45 days     Vernell Leep, MD, [place], [facility]  To contact the attending provider between 7A-7P or the covering provider during after hours 7P-7A, please log into the web site [email or web address] and access using universal [place] password for that web site. If you do not have the password, please call the hospital operator.  09/15/2019, 5:39 PM

## 2019-09-15 NOTE — Progress Notes (Signed)
  Speech Language Pathology Treatment: Dysphagia;Cognitive-Linquistic  Patient Details Name: [name] MRN: [id number] DOB: 1951/09/03 Today's Date: 09/15/2019 Time: 7253-6644 SLP Time Calculation (min) (ACUTE ONLY): 17 min  Assessment / Plan / Recommendation Clinical Impression  Pt still has persistent coughing that is noted before, during, and after PO trials. SLP focused on use of strategies and precautions to increase safety with swallowing. Per chart review pt appears to be tolerating diet with lung sounds clear and no fevers. Pt says that he has been eating well when he gets food that he likes and describes himself as a picky eater PTA. There were items on his tray that he did not want to try, but when given options he selected what he would want and ate it almost in its entirety. He does not recall options even with a very briefly delayed recall. I think that if we can include pt in decision making for meal trays to try to get foods that he likes, that his intake will be sufficient without need for Cortrak. Will continue to follow.    HPI HPI: 68 year old with a history of GERD and BPH who presented to [facility], ED with S OB and was found to be hypoxic.  He was initially diagnosed with COVID-19 September 20.  1 to 2 days prior to his presentation he developed worsening shortness of breath with anorexia. Admitted on 10/3, intubated.  EGD on 10/17 showed normal esophagus but excessive gastric fluid. CT scan of the chest on 10/21 showed findings consistent with an empyema as well as persistent pneumothorax.  Bedside bronchoscopy revealed material in the airway. Trached on 10/30. On ATC by 10/31.Additionally MRI on 11/4 showed There is cortical restricted diffusion posteriorly in the right frontal lobe consistent with acute infarct. Additional smaller regions of diffusion abnormality are present in the posterior left frontal lobe, anterior left frontal lobe, and right greater than left medial  parietal lobes as well as right cerebellum.       SLP Plan  Continue with current plan of care       Recommendations  Diet recommendations: Dysphagia 2 (fine chop);Nectar-thick liquid Liquids provided via: Cup;Straw Medication Administration: Crushed with puree Supervision: Staff to assist with self feeding;Full supervision/cueing for compensatory strategies Compensations: Slow rate;Small sips/bites Postural Changes and/or Swallow Maneuvers: Seated upright 90 degrees;Upright 30-60 min after meal      Patient may use Passy-Muir Speech Valve: Intermittently with supervision;During all therapies with supervision;During PO intake/meals PMSV Supervision: Intermittent MD: Please consider changing trach tube to : Smaller size         Oral Care Recommendations: Oral care QID Follow up Recommendations: Inpatient Rehab SLP Visit Diagnosis: Dysphagia, oropharyngeal phase (R13.12) Plan: Continue with current plan of care       GO                [name] 09/15/2019, 9:24 AM  Pollyann Glen, M.A. [place] Acute Environmental education officer [phone] Office [phone]

## 2019-09-15 NOTE — Progress Notes (Signed)
Nutrition Follow-up  DOCUMENTATION CODES:   Not applicable  INTERVENTION:   -Initiate 48 hour calorie count -D/c Prostat -Continue renal MVI daily -Magic cup TID with meals, each supplement provides 290 kcal and 9 grams of protein -Resume nocturnal feedings:  Nepro @ 65 ml/hr via cortrak tube over 12 hours period (2000-0800)   Tube feeding regimen provides 1404 kcal (64% of needs), 63 grams of protein, and 1134 ml of H2O.   NUTRITION DIAGNOSIS:   Increased nutrient needs related to chronic illness as evidenced by estimated needs.  Ongoing  GOAL:   Patient will meet greater than or equal to 90% of their needs  Progressing   MONITOR:   PO intake, Supplement acceptance, Diet advancement, Labs, Weight trends, Skin, I & O's  REASON FOR ASSESSMENT:   Consult Enteral/tube feeding initiation and management  ASSESSMENT:   68 y/o male admitted with PNA and COVID 19  11/18 Diet advanced to Dysphagia I, Honey Thick 11/21- TF d/c 11/22- advanced to dysphagia 2 diet with nectar thick liquids  Reviewed I/O's: +270 ml x 24 hours and +3.5 L since 09/01/19  UOP: 0 ml x 24 hours  Pt preparing to transfer to HD at time of visit. RD unable to speak with pt at time of visit. Per SLP note, intake remains variable, but suspects this may be related to food preferences.   Noted cortrak tube still in place. Case discussed with RN, who reports plan to remove tube if determined pt is consuming adequate PO per calorie count results.   11/20-11/21 Breakfast: 15 kcals, 0 grams protein Lunch: 81 kcals, 3 grams protein Dinner: 13 kcals, 1 gram protein Supplements: 3 Prostat supplements (300 kcals, 45 grams protein)  Total intake: 409 kcal (5% of minimum estimated needs)  49 grams protein (38% of minimum estimated needs)  11/22 Breakfast: 294 kcals, 10 grams protein Lunch: 600 kcals, 33 grams protein Dinner: 376 kcals, 14 grams protein Supplements: 3 Prostat supplements (300 kcals,  45 grams protein)  Total intake: 1570 kcal (71% of minimum estimated needs)  102 grams protein (78% of minimum estimated needs)  Average Total intake: 1194 kcal (54% of minimum estimated needs)  76 grams protein (58% of minimum estimated needs)  Case discussed with Dr. Algis Liming, who reports plan to determine whether pt is able to meet nutritional needs via PO's vs need for PEG. Pt has had variable intake during calorie count and would best benefit from further monitoring of PO intake to determine if pt is consistently able to meet needs via PO's alone. Pt also with increased nutrient needs related to ESRD on HD, wound healing, and debility from recent COVID-19 infection. Dr. Algis Liming amenable to re-start TF and initiate additional calorie count to better determine pt's need for long term nutrition support.   Labs reviewed: CBGS: 83-94.   Diet Order:   Diet Order            DIET DYS 2 Room service appropriate? Yes; Fluid consistency: Nectar Thick  Diet effective now              EDUCATION NEEDS:   No education needs have been identified at this time  Skin:  Skin Assessment: Skin Integrity Issues: Skin Integrity Issues:: Other (Comment), Incisions, Stage II DTI: - Stage I: - Stage II: perineum, sacrum Incisions: rt lateral lower chest Other: MASD buttocks, penis, open penis sore, lt neck, lt chest  Last BM:  09/14/19  Height:   Ht Readings from Last 1 Encounters:  09/07/19 5\' 10"  (1.778 m)    Weight:   Wt Readings from Last 1 Encounters:  09/15/19 93.6 kg    Ideal Body Weight:  75.45 kg  BMI:  Body mass index is 29.61 kg/m.  Estimated Nutritional Needs:   Kcal:  2200-2400  Protein:  130-150 gm  Fluid:  1000 mL plus UOP    Tanner Vigna A. Jimmye Norman, RD, LDN, [place] Registered Dietitian II Certified Diabetes Care and Education Specialist Pager: [phone] After hours Pager: [phone]

## 2019-09-15 NOTE — Progress Notes (Addendum)
Subjective:  On hd , tolerating uf , no UOP documented   Objective Vital signs in last 24 hours: Vitals:   09/15/19 0003 09/15/19 0457 09/15/19 0632 09/15/19 0804  BP:   (!) 144/85 135/80  Pulse:  89 91 91  Resp:  20 20 18   Temp:    98.7 F (37.1 C)  TempSrc:   Oral Oral  SpO2: 94% 98% 98% 99%  Weight:   93.6 kg   Height:       Weight change: -0.4 kg  Physical Exam: General: Alert chronically ill male on HD, NAD -   Heart: RRR, no m, r, g  Lungs:CTA  , with Trac  mostly clear Abdomen: soft, non tender Extremities: trace bipedal  edema -  bilat boots  Dialysis Access: left TDC patent on HD      Problem/Plan: 1. Pulm-  SP Acute Hypoxemia  Resp failure with COVID -19 -Chest tube out.  Still with trach-  Being downsized- no abx-  Per CCM 2. ESRD- TTS here, HD today on holiday schedule. Presumed as has been HD dep for 5 weeks without any current signs of renal recovery- pre HD BUN and crt of 60 and 4.4 11/21 which was an improvement but no UOP.  Have been doing HD on a TTS schedule here, ( [place] schsedule = mon wed sun)  Has TDC.  Vein mapping done-  Will likely needs staged procedure for AVF-  Want to wait for patient to get stronger.  HD  3. Anemia- hgb 8.7- also with GIB- no hep with HD- on ESA-   iron stores low, will replete as well- supportive care  - transfused one unit on 11/17- last hgb 8.6 4. Secondary hyperparathyroidism- phos is 3.1 - no binders 5. HTN/volume- no scheduled BP meds- trying to challenge volume with success 6. Bilat cerebral cortex and right cerebellum small acute to subacute infarcts with Neuromuscularweakness/encephalopathy 6. Dispo-  Cannot do OP HD with trach, also is extremely debilitated- cannot turn in bed, cant sit up for OP HD - being told that LTAC is not appropriate due to trach but maybe due to this issue of debility- not able to do OP HD or get AVF at this time- still too debilitated    Ernest Haber, PA-C [place]  [phone] 09/15/2019,11:26 AM  LOS: 52 days   Pt seen, examined and agree w A/P as above.  [name]  MD 09/15/2019, 4:50 PM    Labs: Basic Metabolic Panel: Recent Labs  Lab 09/09/19 1130 09/10/19 0431 09/13/19 1303  NA 133* 134* 134*  K 3.2* 3.4* 3.5  CL 92* 95* 95*  CO2 26 26 26   GLUCOSE 110* 112* 90  BUN 106* 44* 60*  CREATININE 5.91* 3.18* 4.37*  CALCIUM 8.5* 8.5* 8.5*  PHOS 4.6  --  3.1   Liver Function Tests: Recent Labs  Lab 09/09/19 1130 09/10/19 0431 09/13/19 1303  AST 37 35  --   ALT 7 6  --   ALKPHOS 96 73  --   BILITOT 0.6 0.4  --   PROT 6.1* 6.6  --   ALBUMIN 1.5* 1.9* 1.7*   No results for input(s): LIPASE, AMYLASE in the last 168 hours. No results for input(s): AMMONIA in the last 168 hours. CBC: Recent Labs  Lab 09/09/19 1130 09/10/19 0431 09/13/19 1303 09/14/19 0401  WBC 14.3* 15.1* 15.5* 12.2*  NEUTROABS 9.0* 10.6*  --   --   HGB 6.9* 8.5* 7.2* 8.6*  HCT 21.6* 26.0* 23.2* 27.6*  MCV 91.9 89.3 92.4 93.9  PLT 233 241 266 295   Cardiac Enzymes: No results for input(s): CKTOTAL, CKMB, CKMBINDEX, TROPONINI in the last 168 hours. CBG: Recent Labs  Lab 09/14/19 1147 09/14/19 1616 09/14/19 1922 09/15/19 0001 09/15/19 0358  GLUCAP 83 85 94 90 78    Studies/Results: No results found. Medications: . sodium chloride Stopped (09/01/19 0000)  .  ceFAZolin (ANCEF) IV 1 g (09/15/19 0118)  . dextrose Stopped (08/16/19 1623)   . aspirin  325 mg Per Tube Daily  . atorvastatin  40 mg Oral q1800  . chlorhexidine  15 mL Mouth/Throat BID  . Chlorhexidine Gluconate Cloth  6 each Topical Q0600  . Chlorhexidine Gluconate Cloth  6 each Topical Q0600  . darbepoetin (ARANESP) injection - DIALYSIS  100 mcg Intravenous Q Thu-HD  . feeding supplement (PRO-STAT SUGAR FREE 64)  30 mL Per Tube TID  . Gerhardt's butt cream   Topical TID  . heparin injection (subcutaneous)  5,000 Units Subcutaneous Q8H  . mouth rinse  15 mL Mouth Rinse 10 times per  day  . multivitamin  1 tablet Oral QHS  . pantoprazole sodium  40 mg Per Tube Daily  . sucralfate  1 g Oral Q6H

## 2019-09-15 NOTE — Progress Notes (Signed)
PT Cancellation Note  Patient Details Name: [name] MRN: [id number] DOB: August 20, 1951   Cancelled Treatment:    Reason Eval/Treat Not Completed: Patient at procedure or test/unavailable. PT attempted x 3, pt is off floor all 3 times. PT will continue to follow and treat as time/schedule allows.   Mickey Farber, PT, DPT   Acute Rehabilitation Department [phone]   Otho Bellows 09/15/2019, 3:16 PM

## 2019-09-15 NOTE — Progress Notes (Signed)
NAME:  [name], MRN:  [id number], DOB:  Apr 06, 1951, LOS: 68 ADMISSION DATE:  07/25/2019, CONSULTATION DATE:  10/3 REFERRING MD:  Nadara Mustard, CHIEF COMPLAINT:  Dyspnea   Brief History   68 y/o male diagnosed with COVID on 9/29 presented to the [facility] ED on 10/2 with dyspnea, hypoxemia requiring intubation.  Admitted to [facility], developed AKI requiring CRRT, pneumothorax requiring chest tube and had a large bronchopleural fistula treated for 10 days with a bronchial blocker.  Tracheostomy placed on 10/30.  To [facility] 11/1 for IHD.  Chest tube to suction, improvement in air leak.  Waterseal 11/13  Past Medical History  GERD  Significant Hospital Events   10/03 Admit to [facility] from [facility] ER, start decadron and remdesivir, given tociluzimab; prone positioning 10/04 convalescent plasma 10/05 stop prone positioning 10/06 convalescent plasma; prone positioning again 10/09 start ABx 10/10 MSSA bacteremia, CVL d/ced; ID consulted; increase OG tube outpt 10/11 vent weaning trial started 10/13 fever, pneumothorax, pig tail chest tube placed 10/14 worsening hypotension and renal fx >> consulted nephrology; worsening PTX >> replaced chest tube; GNR in sputum >> ABx changed 10/15 start CRRT; endobronchial blocker placed for persistent air leak 10/16 endobronchial blocker repositioned, changed chest tube to water seal; melana with ABLA >> GI consulted; transfuse PRBC 10/17 persistent air leak, increased WOB; start nimbex gtt >> air leak decreased; EGD; A fib with RVR >> start amiodarone 10/18 transfuse PRBC; GI s/o 10/20 resume heparin gtt 10/21 stopped nimbex, chest tube to suction, stopped heparin drip because Hgb dropped again 10/22 TPA in chest tube, repositioned endobronchial blocker 10/23 deflated endobronchial balloon 10/24 small air leak  10/25 removed bronchial blocker 10/30 tracheostomy  11/1 D/C CRRT 11/2 Transfer to [facility] 11/3 Started on IHD 11/7 trach sutures removed 11/4 present, weaning on ATC  11/11 required phenylephrine to tolerate iHD 11/13 chest tube to waterseal  Consults:  ID Nephrology Gastroenterology PCCM  Procedures:  10/2 R IJ CVL >> 10/10 10/13 Rt pig tail chest tube >> 10/14 10/14 R 54F chest tube >> 10/14 L IJ HD cath >> 11/10 10/30 tracheostomy >>  11/10 tunneled HD cath >>  Significant Diagnostic Tests:  10/2 CT angiogram chest >> extensive bilateral airspace disease predominantly posterior 10/8 doppler legs b/l >> no DVT 10/15 renal u/s >> normal 10/17 EGD >> non bleeding gastric ulcer 10/19 Echo >> EF 60 to 65%, hyperdynamic LV with small LVOT gradient 10/22 CT chest >> large collection of air in pleural space with fluid, pneumonia bilaterally R>L endobronchial blocker in place, chest tube anterior 10/31 CXR >> persistent small right pneumothorax despite chest in adequate position MRI brain 11/3 >> small acute to subacute infarcts involving bilateral cerebral cortex and right cerebellum.  Large b/l mastoid effusions  Micro Data:  9/20 SARS COV 2 >> POSITIVE 10/2 blood >> negative 10/9 blood >> MSSA 2/4 10/9 resp >> MSSA, Pseudomonas 10/13 blood >> negative 10/21 bronch wash bacterial >> pseduomonas, acinetobacter baumannii 10/21 bronch wash fungal >  10/21 bronch wash aspergillus antigen> negative 10/24 bronchoscopy wash> pseudomonas 10/28 Blood cx>>> negative  10/28 Sputum cx>>> Pseudomonas 11/1 SARS COV 2 > negative 11/2 SARS CoV2 >> negative   Antimicrobials:  10/3 remdesivir > 10/6 10/3 actemra 10/3 decadron > 10/12 10/4 convalescent plasma 10/6 convalescent plasma  10/9 vancomycin > 10/10 10/9 cefepime > 10/10 10/10 ancef > 10/13 10/13 cefepime >10/20 10/14 anidulofungin > 10/15  10/20 ancef > 10/23 10/23 meropenem > >>>off  10/21 anidulofungin> 10/22 10/22 voriconazole > 10/23  10/28 Merrem >>11/2 10/28 Vanc >>10/30  10/31 Ancef >> Tentative stop date 11/23   Interim history/subjective:  No events overnight  Increase secretions from trach site  Objective   Blood pressure 135/80, pulse 91, temperature 98.7 F (37.1 C), temperature source Oral, resp. rate 18, height 5\' 10"  (1.778 m), weight 93.6 kg, SpO2 99 %.    FiO2 (%):  [28 %] 28 %   Intake/Output Summary (Last 24 hours) at 09/15/2019 0175 Last data filed at 09/15/2019 0700 Gross per 24 hour  Intake 270 ml  Output 0 ml  Net 270 ml   Filed Weights   09/13/19 0500 09/14/19 0338 09/15/19 1025  Weight: 93.4 kg 94 kg 93.6 kg   Examination: General: Chronically ill appearing male at this point, NAD HEENT: [place]/AT, PERRL, EOM-I and MMM, size 6 cuffless trach in place Neuro: Very weak but alert and interactive, following some commands CV: RRR, Nl S1/S2 and -M/R/G PULM: Coarse BS diffusely GI: Soft, NT, ND and +BS Extremities: trace edema and -tenderness Skin: no rashes or lesions  I reviewed CXR myself, trach is in a good position  Discussed with PCCM-NP  Assessment & Plan:   Acute respiratory failure with hypoxemia - 2/2 COVID-19 (s/p remdsevir, actemra, convalescent plasma, steroids)  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  Now with persistent airleak / BPF despite endobronchial blocker placement (removed 10/25 due to MSSA bacteremia) - off MV since 11/2 P: TC to 28% Titrate FiO2 for sat of 88-92%, if to RA then insure humidification please PMV per SLP Maintain trach to size 6 cuffless given secretions and physical debility PCCM will continue to follow Trach change per protocol No capping trials at this time Plan is to eventually get to size 4 cuffless then begin capping trials  ESRD now on iHD P: iHD per nephrology  Bilateral cerebral cortex and right cerebellum small acute to subacute infarcts Neuromuscular weakness/encephalopathy Persistent shock/vasoplegia -improved P: PT/OT OOB to chair as able  MSSA bacteremia -TTE negative.  P: Cefazolin for 6 weeks total stop date  today 11/23  Anemia of chronic disease with GI bleed and gastric ulcer  Recent Labs    09/13/19 1303 09/14/19 0401  HGB 7.2* 8.6*    P:  Transfuse per protocol  Rush Farmer, M.D. [facility] Pulmonary/Critical Care Medicine.

## 2019-09-15 NOTE — Progress Notes (Signed)
Inpatient Rehab Admissions:  Inpatient Rehab Consult received.  I spoke with his wife, [name], over the phone for rehabilitation assessment and to discuss goals and expectations of an inpatient rehab admission.  Given pt's current level of function and slow progress, his estimated goals will require 24/7 physical assist.  Discussed with [name] and she would like to discuss with her children whether they feel they can provide this level of care for him.  Will plan to f/u with her on Wednesday morning.   Signed: Shann Medal, PT, DPT Admissions Coordinator [phone] 09/15/19  4:15 PM

## 2019-09-15 NOTE — Plan of Care (Signed)
  Problem: Education: Goal: Knowledge of risk factors and measures for prevention of condition will improve Outcome: Progressing   Problem: Education: Goal: Knowledge of General Education information will improve Description: Including pain rating scale, medication(s)/side effects and non-pharmacologic comfort measures Outcome: Progressing   

## 2019-09-15 NOTE — Consult Note (Signed)
Physical Medicine and Rehabilitation Consult  Reason for Consult: Functional deficits due to COVID 19 with multiple medical issues.  Referring Physician: Dr. Algis Liming.    HPI: [name] is a 68 y.o. male originally diagnosed with COVID 19 on 07/13/19 with progressive tachypnea and SOB therefore admitted to [facility] on 07/26/19 and treated with Remdesivir, CCP, decadron, Vancomycin/Cefepime and required intubation due to ARDs.  Hospital course significant for MSSA bacteremia with volume overload, hypotensive shock, AKI with metabolic acidosis requiring CRRT, Afib with RVR, melena due to gastric ulcer with ABLA treated with clipping by Dr. Cristina Gong. He developed PTX with empyema and large bronchopleural fistula with persistent leak requiring chest tube but continued to have persistent leak. Marland Kitchen He underwent tracheostomy on 08/22/19, was transitioned to HD with pressor support and transferred to [facility]. Dr. Roxan Hockey following of input.  He developed decreased in LOC with inability to follow commands on 11/4 and was found to have small acute/subacute infarcts involving bilateral cerebral cortex and right cerebellum. EEG showed moderate diffuse encephalopathy and Dr. Erlinda Hong felt that stroke likely due to hypotension secondary to HD and sepsis --PAF could be potential etiology --ASA recommended for secondary stroke prevention with SBP range 120-140. MS changes felt to be due to encephalopathy and EEG negative for seizures. HD catheter placed by Dr. Vernard Gambles on 11/10.  Family elected on full scope of care and chest tube d/c 11/16, he has been extubated to ATC and is tolerating PMSV. He was started on diet and has been advanced to dysphagia 2, nectars to allow for more palatable options. He has been downsized to CFS#6 and PCCM recommends avoiding capping due to excessive secretions. Therapy ongoing and patient with delayed processing, difficulty sequencing, limited vocalization and using tilt bed to get to 50 degrees  with minimal orthostatic symptoms.  Feeding tube remains in place--did eat 505 at breakfast this am per nurse. CIR recommended due to functional deficits.    Review of Systems  Unable to perform ROS: Mental acuity  Eyes: Positive for blurred vision.  Respiratory: Positive for cough and sputum production.   Cardiovascular: Negative for chest pain.  Gastrointestinal: Negative for abdominal pain.  Musculoskeletal: Positive for back pain. Negative for myalgias.  Neurological: Positive for weakness.  Psychiatric/Behavioral: Depression: .1rehab.      Past Medical History:  Diagnosis Date   COVID-19    Heartburn     Past Surgical History:  Procedure Laterality Date   ACHILLES TENDON SURGERY Right 2012   COLONOSCOPY  2009   [facility]   ESOPHAGOGASTRODUODENOSCOPY N/A 12/25/2014   Procedure: ESOPHAGOGASTRODUODENOSCOPY (EGD);  Surgeon: Danie Binder, MD;  Location: [facility];  Service: Endoscopy;  Laterality: N/A;  830am   ESOPHAGOGASTRODUODENOSCOPY N/A 08/09/2019   Procedure: ESOPHAGOGASTRODUODENOSCOPY (EGD);  Surgeon: Ronald Lobo, MD;  Location: [facility];  Service: Endoscopy;  Laterality: N/A;  Patient is already sedated, so I do not anticipate need for further sedation.  Okay from my standpoint to do either at the bedside or in the operating room   HEMOSTASIS CLIP PLACEMENT  08/09/2019   Procedure: [place];  Surgeon: Ronald Lobo, MD;  Location: [facility] ENDOSCOPY;  Service: Endoscopy;;   HEMOSTASIS CONTROL  08/09/2019   Procedure: HEMOSTASIS CONTROL;  Surgeon: Ronald Lobo, MD;  Location: [facility] ENDOSCOPY;  Service: Endoscopy;;  epi    IR FLUORO GUIDE CV LINE LEFT  09/02/2019   IR US GUIDE VASC ACCESS LEFT  09/02/2019   KNEE SURGERY Left 1980's   none  WRIST SURGERY Left 1970's    Family History  Problem Relation Age of Onset   Colon cancer Neg Hx    Colon polyps Neg Hx     Social History:  Married--wife retired.  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 He reports that he has never smoked. He has never used smokeless tobacco. He reports current alcohol use of about 2.0 standard drinks of alcohol per day. He reports that he does not use drugs.    Allergies: No Known Allergies    Medications Prior to Admission  Medication Sig Dispense Refill   ondansetron (ZOFRAN ODT) 4 MG disintegrating tablet Take 1 tablet (4 mg total) by mouth every 8 (eight) hours as needed for nausea or vomiting. 20 tablet 0    Home: Home Living Family/patient expects to be discharged to:: Private residence Living Arrangements: Spouse/significant other Available Help at Discharge: Family Type of Home: House Home Access: Stairs to enter Technical brewer of Steps: 3 Entrance Stairs-Rails: Right, Left Home Layout: One level Bathroom Shower/Tub: Chiropodist: Handicapped height Bathroom Accessibility: Yes Home Equipment: None  Lives With: Spouse  Functional History: Prior Function Level of Independence: Independent Comments: golfs everyday, retired 2 months  Functional Status:  Mobility: Bed Mobility Overal bed mobility: Needs Assistance Bed Mobility: Rolling Rolling: Max assist Sidelying to sit: Total assist, +2 for physical assistance Sit to sidelying: +2 for physical assistance, Total assist, +2 for safety/equipment General bed mobility comments: Max assist to roll for positioning on side at end of session.  Transfers General transfer comment: will need Maximove      ADL: ADL Overall ADL's : Needs assistance/impaired Eating/Feeding: NPO Grooming: Maximal assistance, Oral care, Wash/dry face Functional mobility during ADLs: Total assistance, +2 for physical assistance, Maximal assistance General ADL Comments: totalA for all ADL, hand over hand and support at elbow to wash face;pt able to tolerate sitting EOB for 56min with maxA for stability  Cognition: Cognition Overall Cognitive Status:  Impaired/Different from baseline Arousal/Alertness: Awake/alert Orientation Level: Oriented X4 Attention: Focused Focused Attention: Impaired Focused Attention Impairment: Verbal basic, Functional basic Cognition Arousal/Alertness: Lethargic Behavior During Therapy: Flat affect Overall Cognitive Status: Impaired/Different from baseline Area of Impairment: Attention, Safety/judgement, Following commands, Awareness, Problem solving Current Attention Level: Sustained Following Commands: Follows one step commands with increased time Safety/Judgement: Decreased awareness of safety, Decreased awareness of deficits Awareness: Emergent Problem Solving: Slow processing, Decreased initiation, Difficulty sequencing, Requires verbal cues General Comments: Pt able to vocalize when encouraged via PMV in cuffless trach, lethargic today, but participatory.  Difficult to assess due to: Tracheostomy   Blood pressure 135/80, pulse 91, temperature 98.7 F (37.1 C), temperature source Oral, resp. rate 18, height 5\' 10"  (1.778 m), weight 93.6 kg, SpO2 99 %.   Physical Exam  Nursing note and vitals reviewed. Currently getting dialysis.  Constitutional: He appears well-developed and well-nourished.  Lying across the bed with head flexed at right bed rail. Unable to raise head or self correct. [place] in place.   Neck:  CFS #6 in place with PMSV--frequent coughing therefore PMSV removed with drainage of copious thick purulent secretions.   GI: He exhibits no distension. There is no abdominal tenderness.  Neurological: He is alert. A cranial nerve deficit is present.  Left facial weakness with dysphonia.  Did engage and attempted to answer biographic questions.  Left inattention needing max cues to scan to left and to keep eyes midline. Possible left visual field deficit. Left dense hemiparesis with decreased sensation. RLE  weakness noted with RUE apraxia.  Musculoskeletal: 0/5 movement in LUE. Otherwise  diffusely weak with 3/5 strength throughout, with the exception of 2/5 HF bilaterally.  Ext: no edema. With bilateral PRAFO boots on.  Psych: pleasant, normal affect  Results for orders placed or performed during the hospital encounter of 07/25/19 (from the past 24 hour(s))  Glucose, capillary     Status: None   Collection Time: 09/14/19 11:47 AM  Result Value Ref Range   Glucose-Capillary 83 70 - 99 mg/dL  Glucose, capillary     Status: None   Collection Time: 09/14/19  4:16 PM  Result Value Ref Range   Glucose-Capillary 85 70 - 99 mg/dL  Glucose, capillary     Status: None   Collection Time: 09/14/19  7:22 PM  Result Value Ref Range   Glucose-Capillary 94 70 - 99 mg/dL   Comment 1 Notify RN    Comment 2 Document in Chart   Glucose, capillary     Status: None   Collection Time: 09/15/19 12:01 AM  Result Value Ref Range   Glucose-Capillary 90 70 - 99 mg/dL   Comment 1 Notify RN    Comment 2 Document in Chart   Glucose, capillary     Status: None   Collection Time: 09/15/19  3:58 AM  Result Value Ref Range   Glucose-Capillary 78 70 - 99 mg/dL   Comment 1 Notify RN    Comment 2 Document in Chart    No results found.   Assessment/Plan: Diagnosis: Impaired mobility and ADLs secondary to COVID-19 pneumonia.  1. Does the need for close, 24 hr/day medical supervision in concert with the patient's rehab needs make it unreasonable for this patient to be served in a less intensive setting? Yes 2. Co-Morbidities requiring supervision/potential complications: tachypnea, SOB, ARDS, MSSA bacteremia with volume overload, hypotensive shock, AKI with metabolic acidosis requiring CRRT, afib with RVR, melena due to gastric ulcer, pneumothorax.  3. Due to safety, skin/wound care, disease management, medication administration, pain management and patient education, does the patient require 24 hr/day rehab nursing? Yes 4. Does the patient require coordinated care of a physician, rehab nurse,  therapy disciplines of PT, OT, and SLP to address physical and functional deficits in the context of the above medical diagnosis(es)? Yes Addressing deficits in the following areas: balance, endurance, locomotion, strength, transferring, bowel/bladder control, bathing, dressing, feeding, grooming, toileting, cognition, speech, language, swallowing and psychosocial support 5. Can the patient actively participate in an intensive therapy program of at least 3 hrs of therapy per day at least 5 days per week? Yes 6. The potential for patient to make measurable gains while on inpatient rehab is excellent 7. Anticipated functional outcomes upon discharge from inpatient rehab are MinA  with PT, MinA with OT, modified independent with SLP. 8. Estimated rehab length of stay to reach the above functional goals is: 3 weeks 9. Anticipated discharge destination: Home 10. Overall Rehab/Functional Prognosis: excellent  RECOMMENDATIONS: This patient's condition is appropriate for continued rehabilitative care in the following setting: CIR Patient has agreed to participate in recommended program. Yes Note that insurance prior authorization may be required for reimbursement for recommended care.  Comment: [name] is currently very fatigued, receiving dialysis. He has significant PT, OT, and SLP needs and would benefit from CIR level therapies.   Bary Leriche, PA-C 09/15/2019   I have personally performed a face to face diagnostic evaluation, including, but not limited to relevant history and physical exam findings, of this patient  and developed relevant assessment and plan.  Additionally, I have reviewed and concur with the physician assistant's documentation above.  Leeroy Cha, MD

## 2019-09-16 ENCOUNTER — Inpatient Hospital Stay (HOSPITAL_COMMUNITY): Payer: Medicare HMO

## 2019-09-16 DIAGNOSIS — N170 Acute kidney failure with tubular necrosis: Secondary | ICD-10-CM | POA: Diagnosis not present

## 2019-09-16 DIAGNOSIS — D62 Acute posthemorrhagic anemia: Secondary | ICD-10-CM | POA: Diagnosis not present

## 2019-09-16 DIAGNOSIS — U071 COVID-19: Secondary | ICD-10-CM | POA: Diagnosis not present

## 2019-09-16 DIAGNOSIS — J9600 Acute respiratory failure, unspecified whether with hypoxia or hypercapnia: Secondary | ICD-10-CM | POA: Diagnosis not present

## 2019-09-16 LAB — FUNGUS CULTURE WITH STAIN

## 2019-09-16 LAB — GLUCOSE, CAPILLARY
Glucose-Capillary: 102 mg/dL — ABNORMAL HIGH (ref 70–99)
Glucose-Capillary: 108 mg/dL — ABNORMAL HIGH (ref 70–99)
Glucose-Capillary: 108 mg/dL — ABNORMAL HIGH (ref 70–99)
Glucose-Capillary: 118 mg/dL — ABNORMAL HIGH (ref 70–99)
Glucose-Capillary: 119 mg/dL — ABNORMAL HIGH (ref 70–99)
Glucose-Capillary: 95 mg/dL (ref 70–99)
Glucose-Capillary: 96 mg/dL (ref 70–99)

## 2019-09-16 LAB — FUNGUS CULTURE RESULT

## 2019-09-16 LAB — FUNGAL ORGANISM REFLEX

## 2019-09-16 MED ORDER — SODIUM CHLORIDE 0.9 % IV SOLN
125.0000 mg | Freq: Once | INTRAVENOUS | Status: AC
  Administered 2019-09-17: 125 mg via INTRAVENOUS
  Filled 2019-09-16: qty 10

## 2019-09-16 MED ORDER — SODIUM CHLORIDE 0.9 % IV SOLN
125.0000 mg | INTRAVENOUS | Status: AC
  Administered 2019-09-20 – 2019-09-27 (×3): 125 mg via INTRAVENOUS
  Filled 2019-09-16 (×8): qty 10

## 2019-09-16 MED ORDER — BUDESONIDE 0.5 MG/2ML IN SUSP
0.5000 mg | Freq: Two times a day (BID) | RESPIRATORY_TRACT | Status: DC
  Administered 2019-09-16 – 2019-10-03 (×28): 0.5 mg via RESPIRATORY_TRACT
  Filled 2019-09-16 (×34): qty 2

## 2019-09-16 MED ORDER — SODIUM CHLORIDE 0.9 % IV SOLN
100.0000 mg | Freq: Two times a day (BID) | INTRAVENOUS | Status: DC
  Administered 2019-09-16 – 2019-09-22 (×13): 100 mg via INTRAVENOUS
  Filled 2019-09-16 (×13): qty 100

## 2019-09-16 MED ORDER — SODIUM CHLORIDE 0.9 % IV SOLN: 125.0000 mg | Freq: Once | INTRAVENOUS | Status: DC

## 2019-09-16 MED ORDER — IPRATROPIUM-ALBUTEROL 0.5-2.5 (3) MG/3ML IN SOLN
3.0000 mL | Freq: Four times a day (QID) | RESPIRATORY_TRACT | Status: DC
  Administered 2019-09-16 – 2019-09-18 (×6): 3 mL via RESPIRATORY_TRACT
  Filled 2019-09-16 (×6): qty 3

## 2019-09-16 NOTE — Progress Notes (Signed)
Calorie Count Note  RD working remotely.  48 hour calorie count ordered.  Diet: dysphagia 2 with nectar thick liquids Supplements: renal MVI, Magic cup TID with meals, each supplement provides 290 kcal and 9 grams of protein  Nocturnal feedings: Nepro @ 65 ml/hr via cortrak tube over 12 hours period (2000-0800). Tube feeding regimen provides 1404 kcal (64% of needs), 63 grams of protein, and 1134 ml of H2O.  Case discussed with Dr. Algis Liming yesterday. Plan to continue calorie count over the next 48 hours and re-start nocturnal feedings to determine if pt can consume adequate PO's to better assess if pt will require permanent feeding access.   09/15/19 Breakfast: 234 kcals, 5 grams protein Lunch: 376 kcals, 14 grams protein Dinner: 517 kcals, 23 grams protein  09/16/19 Breakfast: 0% recorded  Total intake: 1127 kcal (51% of minimum estimated needs)  60 grams protein (46% of minimum estimated needs)  Nutrition Dx: Increased nutrient needs related to chronic illness as evidenced by estimated needs; ongoing  Goal: Patient will meet greater than or equal to 90% of their needs; progressing   Intervention:   -Continue 48 hour calorie count daily -Continue renal MVI daily -Continue Magic cup TID with meals, each supplement provides 290 kcal and 9 grams of protein -Continue nocturnal feedings:  Nepro @ 65 ml/hr via cortrak tube over 12 hours period (2000-0800)   Tube feeding regimen provides 1404 kcal (64% of needs), 63 grams of protein, and 1134 ml of H2O.   [name] A. Jimmye Norman, RD, LDN, [place] Registered Dietitian II Certified Diabetes Care and Education Specialist Pager: [phone] After hours Pager: [phone]

## 2019-09-16 NOTE — Progress Notes (Signed)
Transfer Note:   Traveling Method:bed Transferring Unit: Mental Orientation: alert Assessment:  Skin:  IV:  Pain:  Tubes:ng tube Safety Measures: Safety Fall Prevention Plan has been given, discussed and signed Admission: Completed 45M Orientation: Patient has been orientated to the room, unit and staff.  Family: wife at bedside Transferring Incident:   Orders have been reviewed and implemented. Will continue to monitor the patient. Call light has been placed within reach and bed alarm has been activated.  Shela Commons, RN

## 2019-09-16 NOTE — Progress Notes (Addendum)
Subjective: states feeling better  But requesting cough med , not sob , speech better more coherant/ tolerated HD yest.  Objective Vital signs in last 24 hours: Vitals:   09/16/19 0334 09/16/19 0600 09/16/19 0913 09/16/19 1204  BP:  (!) 142/81  (!) 145/89  Pulse: 95 95  (!) 112  Resp: 18 (!) 22  (!) 21  Temp:  99 F (37.2 C)  99.9 F (37.7 C)  TempSrc:  Oral  Oral  SpO2: 99% 98% 94% 94%  Weight:  94 kg    Height:       Weight change: 0.4 kg  Physical Exam: General: Alert chronically ill male  NAD   Heart: RRR, no m, r, g  Lungs:CTA  , with Trach Collar Abdomen: soft, non tender Extremities: trace bipedal  edema - bilat boots  Dialysis Access: left TDC     Problem/Plan: 1. Pulm-  SP Acute Hypoxemia  Resp failure with COVID -19 -Chest tube out. Still with trach- Being  downsized- no abx- Per CCM 2. ESRD- TTS here, HD  holiday schedule.MWS Presumed as has been HD dep for 5 weeks without any current signs of renal recovery- Has TDC. Vein mapping done- Will likely needs staged procedure for AVF- Want to wait for patient to get stronger.  3. Anemia- - also with GIB- no hep with HD- on ESA- iron stores low, will replete as well- supportive care - transfused one unit on 11/17- last hgb 8.6 4. Secondary hyperparathyroidism- phos is 3.1 - no binders 5. HTN/volume-no scheduled BP meds- trying to challenge volume with success 6. Bilat cerebral cortex and right cerebellum small acute to subacute infarcts with Neuromuscularweakness/encephalopathy  See disposition  6. Dispo-Cannot do OP HD with trach, also is extremely debilitated- cannot turn in bed, cant sit up for OP HD - being told that LTAC is not appropriate due to trach but maybe due to this issue of debility- not able to do OP HD or get AVF at this time- still too debilitated  / noted Rehab Coordinator notes=" wife  Planning discussion with children  Possibility of care at home " But can he travel back and forth to OP  HD and do HD in a chair for 4hrs?  These are necessary requirements for dc to home or to SNF.     Ernest Haber, PA-C [facility] Beeper [phone] 09/16/2019,12:05 PM  LOS: 53 days   Pt seen, examined and agree w A/P as above.  [name]  MD 09/16/2019, 3:20 PM    Labs: Basic Metabolic Panel: Recent Labs  Lab 09/10/19 0431 09/13/19 1303  NA 134* 134*  K 3.4* 3.5  CL 95* 95*  CO2 26 26  GLUCOSE 112* 90  BUN 44* 60*  CREATININE 3.18* 4.37*  CALCIUM 8.5* 8.5*  PHOS  --  3.1   Liver Function Tests: Recent Labs  Lab 09/10/19 0431 09/13/19 1303  AST 35  --   ALT 6  --   ALKPHOS 73  --   BILITOT 0.4  --   PROT 6.6  --   ALBUMIN 1.9* 1.7*   No results for input(s): LIPASE, AMYLASE in the last 168 hours. No results for input(s): AMMONIA in the last 168 hours. CBC: Recent Labs  Lab 09/10/19 0431 09/13/19 1303 09/14/19 0401  WBC 15.1* 15.5* 12.2*  NEUTROABS 10.6*  --   --   HGB 8.5* 7.2* 8.6*  HCT 26.0* 23.2* 27.6*  MCV 89.3 92.4 93.9  PLT 241 266 295  Cardiac Enzymes: No results for input(s): CKTOTAL, CKMB, CKMBINDEX, TROPONINI in the last 168 hours. CBG: Recent Labs  Lab 09/15/19 1824 09/15/19 2029 09/16/19 0006 09/16/19 0414 09/16/19 0805  GLUCAP 85 85 118* 96 95    Studies/Results: No results found. Medications: . sodium chloride Stopped (09/15/19 2208)  . dextrose Stopped (08/16/19 1623)  . feeding supplement (NEPRO CARB STEADY) 1,000 mL (09/15/19 2127)   . aspirin  325 mg Per Tube Daily  . atorvastatin  40 mg Oral q1800  . chlorhexidine  15 mL Mouth/Throat BID  . Chlorhexidine Gluconate Cloth  6 each Topical Q0600  . Chlorhexidine Gluconate Cloth  6 each Topical Q0600  . darbepoetin (ARANESP) injection - DIALYSIS  100 mcg Intravenous Q Thu-HD  . heparin injection (subcutaneous)  5,000 Units Subcutaneous Q8H  . mouth rinse  15 mL Mouth Rinse 10 times per day  . multivitamin  1 tablet Oral QHS  . pantoprazole sodium  40  mg Per Tube Daily  . sucralfate  1 g Oral Q6H

## 2019-09-16 NOTE — TOC Progression Note (Addendum)
Transition of Care [facility]) - Progression Note    Patient Details  Name: [name] MRN: [id number] Date of Birth: [birth year]  [facility] CM/SW Contact  Zenon Mayo, RN Phone Number: 09/16/2019, 3:09 PM  Clinical Narrative:    Notes from previous NCM -  CM informed that barriers for clipping a trach pt include;  Pt would have to be capped,  Pt could not require any suctioning,  Pt would have to be able to sit up.  CM text paged PCCM to discuss barriers listed above and to request understanding of pt's trach course. Pt also has NG tube which will serve as a barrier for SNF placement.    Update:  [facility], The referral not appropriate at this time as pt only needs trach to manage secretions, it is the expectation that pt should be able to manage his secretions soon.  CM communicated barriers with outpt HD related to trach - PCCM to reassess pt to determine if pt can meet restrictions with trach in outpt HD setting.    Patient uses PMV, he is a feeder, has NG tube feed and also able to take po foods (dysphagia 2 diet), has stage 2 sacral ulcer.  TOC team will continue to follow for dc needs.  Patient transferred to 41M.  11/25- Per CIR rep , patient will need to be able to sit up for dialysis and will have to be set up for outpatient dialysis before CIR can take him.    Expected Discharge Plan: Long Term Acute Care (LTAC) Barriers to Discharge: Continued Medical Work up  Expected Discharge Plan and Services Expected Discharge Plan: [place] (LTAC)                                               Social Determinants of Health (SDOH) Interventions    Readmission Risk Interventions No flowsheet data found.

## 2019-09-16 NOTE — Progress Notes (Signed)
Occupational Therapy Treatment Patient Details Name: [name] MRN: [id number] DOB: Jan 25, 1951 Today's Date: 09/16/2019    History of present illness 68 y/o male diagnosed with COVID on 9/29 presented to the [facility] ED on 10/2 with dyspnea, hypoxemia requiring intubation.  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  Tracheostomy placed on 10/30. Tested COVID neg x2 on 11/2. MRI on 11/4 showed There is cortical restricted diffusion posteriorly in the right frontal lobe consistent with acute infarct. Additional smaller regions of diffusion abnormality are present in the posterior left frontal lobe, anterior left frontal lobe, and right greater than left medial parietal lobes as well as right cerebellum.    OT comments  Patient eager to participate in OT/PT session.  Patient requires 2+ assist with max assist to EOB, engaged in EOB sitting balance, weight bearing and grooming tasks at EOB for approx 15 minutes (noted HR up to 116) with max assist overall (but at times requiring only min assist).  Able to actively squeeze L hand and internally rotate shoulder.  Patient requires cueing to attend to L side, attend to task, and scan.  Encouraged family to provide input to L side when visiting.  Left in chair position in bed, notified nursing of patient request to sit in chair today (recommended use of maximove sling). Will follow acutely.  Dc plan remains appropriate.   Follow Up Recommendations  Supervision/Assistance - 24 hour;CIR    Equipment Recommendations  Wheelchair cushion (measurements OT);Wheelchair (measurements OT);Hospital bed    Recommendations for Other Services      Precautions / Restrictions Precautions Precautions: Fall Required Braces or Orthoses: Other Brace Other Brace: bil prevalon boots Restrictions Weight Bearing Restrictions: No       Mobility Bed  Mobility Overal bed mobility: Needs Assistance Bed Mobility: Rolling;Sidelying to Sit;Sit to Supine Rolling: Max assist Sidelying to sit: Max assist;+2 for safety/equipment   Sit to supine: Total assist;+2 for physical assistance   General bed mobility comments: Max +2 to get EOB requiring A for LE advancement and trunk elevation, total A to return to supine and for repositioning, requires VC/TC for initiation and sequencing  Transfers                 General transfer comment: will need Maximove    Balance Overall balance assessment: Needs assistance Sitting-balance support: Feet supported;Bilateral upper extremity supported Sitting balance-Leahy Scale: Zero Sitting balance - Comments: pt requires max A to sit EOB, pt presents with strong left lateral lean needed A and cuing to correct, pt progressed to having a few moments of min A to maintain sitting balance, pt actively able to pull trunk forward when cued Postural control: Left lateral lean                                 ADL either performed or assessed with clinical judgement   ADL Overall ADL's : Needs assistance/impaired     Grooming: Maximal assistance;Wash/dry hands;Sitting Grooming Details (indicate cue type and reason): sitting EOB with max assist x 1 and max assist to wash hands  Upper Body Bathing: Maximal assistance;Sitting Upper Body Bathing Details (indicate cue type and reason): hand over hand support to guide washing L UE to increase awareness to UE  Vision   Additional Comments: patient able to scan towards L with increased time    Perception     Praxis      Cognition Arousal/Alertness: Awake/alert Behavior During Therapy: Flat affect Overall Cognitive Status: Impaired/Different from baseline Area of Impairment: Following commands;Safety/judgement;Problem solving;Awareness;Attention                   Current Attention Level:  Sustained   Following Commands: Follows one step commands with increased time Safety/Judgement: Decreased awareness of safety;Decreased awareness of deficits Awareness: Emergent Problem Solving: Slow processing;Decreased initiation;Difficulty sequencing;Requires verbal cues General Comments: pt following 1 step commands with increased time in quiet, non distracting enviornment--PMV on        Exercises Exercises: Other exercises Other Exercises Other Exercises: PROM of L UE supine from hand to shoulder; pt able to initate hand squeeze and shoulder IR with increased time   Shoulder Instructions       General Comments VSS, HR up to 116 EOB     Pertinent Vitals/ Pain       Pain Assessment: Faces Faces Pain Scale: Hurts a little bit Pain Location: L shoulder, uncomfortable in bed Pain Descriptors / Indicators: Discomfort;Grimacing Pain Intervention(s): Limited activity within patient's tolerance;Monitored during session;Repositioned  Home Living                                          Prior Functioning/Environment              Frequency  Min 2X/week        Progress Toward Goals  OT Goals(current goals can now be found in the care plan section)  Progress towards OT goals: Progressing toward goals  Acute Rehab OT Goals Patient Stated Goal: to sit up  OT Goal Formulation: With patient Time For Goal Achievement: 09/23/19 Potential to Achieve Goals: Good  Plan Discharge plan remains appropriate;Frequency remains appropriate    Co-evaluation    PT/OT/SLP Co-Evaluation/Treatment: Yes Reason for Co-Treatment: Complexity of the patient's impairments (multi-system involvement);To address functional/ADL transfers;For patient/therapist safety PT goals addressed during session: Mobility/safety with mobility OT goals addressed during session: ADL's and self-care      AM-PAC OT "6 Clicks" Daily Activity     Outcome Measure   Help from another person  eating meals?: Total Help from another person taking care of personal grooming?: A Lot Help from another person toileting, which includes using toliet, bedpan, or urinal?: Total Help from another person bathing (including washing, rinsing, drying)?: A Lot Help from another person to put on and taking off regular upper body clothing?: A Lot Help from another person to put on and taking off regular lower body clothing?: Total 6 Click Score: 9    End of Session Equipment Utilized During Treatment: Oxygen(via trach- 28% 5L)  OT Visit Diagnosis: Unsteadiness on feet (R26.81);Other abnormalities of gait and mobility (R26.89);Muscle weakness (generalized) (M62.81)   Activity Tolerance Patient tolerated treatment well   Patient Left in bed;with call bell/phone within reach;with bed alarm set;with nursing/sitter in room   Nurse Communication Mobility status        Time: 1127-1201 OT Time Calculation (min): 34 min  Charges: OT General Charges $OT Visit: 1 Visit OT Treatments $Self Care/Home Management : 8-22 mins  Delight Stare, [place] Pager [phone] Office [phone]    Delight Stare 09/16/2019, 1:22 PM

## 2019-09-16 NOTE — Progress Notes (Signed)
Physical Therapy Treatment Patient Details Name: [name] MRN: [id number] DOB: 07/19/1951 Today's Date: 09/16/2019    History of Present Illness 68 y/o male diagnosed with COVID on 9/29 presented to the [facility] ED on 10/2 with dyspnea, hypoxemia requiring intubation.  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  Tracheostomy placed on 10/30. Tested COVID neg x2 on 11/2. MRI on 11/4 showed There is cortical restricted diffusion posteriorly in the right frontal lobe consistent with acute infarct. Additional smaller regions of diffusion abnormality are present in the posterior left frontal lobe, anterior left frontal lobe, and right greater than left medial parietal lobes as well as right cerebellum.     PT Comments    Pt in bed upon arrival with family present. Pt agreeable to PT today. Pt requesting to sit up in recliner at end of session with nursing notified of request and using lift for transfer. Pt is progressing towards PT goals. Pt required max A +2 to get EOB. Pt tolerated sitting EOB with weight bearing through bilateral hands and feet for ~15 min. Pt required max A to maintain sitting balance secondary to strong left lateral lean but progressing to having brief moments requiring min A. Pt able to pull trunk forward with assist from therapist. Pt actively able to squeeze left hand and plantar flex left foot today while sitting EOB. VSS t/o session. Worked on reaching outside BOS with right hand while sitting EOB. Pt required total A to return to supine and for repositioning in bed. Pt ended session in bed with bed in chair position to allow for more upright posture. Pt would benefit from continued skilled acute PT to further progress functional mobility.   Follow Up Recommendations  CIR     Equipment Recommendations  Wheelchair (measurements PT);Wheelchair cushion  (measurements PT);Hospital bed;Other (comment)    Recommendations for Other Services       Precautions / Restrictions Precautions Precautions: Fall Required Braces or Orthoses: Other Brace Other Brace: bil prevalon boots Restrictions Weight Bearing Restrictions: No    Mobility  Bed Mobility Overal bed mobility: Needs Assistance Bed Mobility: Rolling;Sidelying to Sit;Sit to Supine Rolling: Max assist Sidelying to sit: Max assist;+2 for safety/equipment   Sit to supine: Total assist;+2 for physical assistance   General bed mobility comments: Max +2 to get EOB requiring A for LE advancement and trunk elevation, total A to return to supine and for repositioning, requires VC/TC for initiation and sequencing  Transfers                    Ambulation/Gait                 Stairs             Wheelchair Mobility    Modified Rankin (Stroke Patients Only) Modified Rankin (Stroke Patients Only) Pre-Morbid Rankin Score: No symptoms Modified Rankin: Severe disability     Balance Overall balance assessment: Needs assistance Sitting-balance support: Feet supported;Bilateral upper extremity supported Sitting balance-Leahy Scale: Zero Sitting balance - Comments: pt requires max A to sit EOB, pt presents with strong left lateral lean needed A and cuing to correct, pt progressed to having a few moments of min A to maintain sitting balance, pt actively able to pull trunk forward when cued Postural control: Left lateral lean  Cognition Arousal/Alertness: Awake/alert Behavior During Therapy: Flat affect Overall Cognitive Status: Impaired/Different from baseline Area of Impairment: Following commands;Safety/judgement;Problem solving                       Following Commands: Follows one step commands with increased time Safety/Judgement: Decreased awareness of safety;Decreased awareness of deficits   Problem  Solving: Slow processing;Decreased initiation;Difficulty sequencing;Requires verbal cues        Exercises      General Comments General comments (skin integrity, edema, etc.): pt wanting to sit up in recliner, nursing notified of request to use lift to get to recliner      Pertinent Vitals/Pain Pain Assessment: Faces Faces Pain Scale: Hurts a little bit Pain Location: L shoulder, uncomfortable in bed Pain Descriptors / Indicators: Discomfort;Grimacing Pain Intervention(s): Limited activity within patient's tolerance;Monitored during session;Repositioned    Home Living                      Prior Function            PT Goals (current goals can now be found in the care plan section) Progress towards PT goals: Progressing toward goals    Frequency    Min 3X/week      PT Plan Current plan remains appropriate    Co-evaluation PT/OT/SLP Co-Evaluation/Treatment: Yes Reason for Co-Treatment: Complexity of the patient's impairments (multi-system involvement);To address functional/ADL transfers;For patient/therapist safety PT goals addressed during session: Mobility/safety with mobility        AM-PAC PT "6 Clicks" Mobility   Outcome Measure  Help needed turning from your back to your side while in a flat bed without using bedrails?: Total Help needed moving from lying on your back to sitting on the side of a flat bed without using bedrails?: Total Help needed moving to and from a bed to a chair (including a wheelchair)?: Total Help needed standing up from a chair using your arms (e.g., wheelchair or bedside chair)?: Total Help needed to walk in hospital room?: Total Help needed climbing 3-5 steps with a railing? : Total 6 Click Score: 6    End of Session Equipment Utilized During Treatment: Oxygen Activity Tolerance: Patient tolerated treatment well Patient left: in bed;with call bell/phone within reach;with family/visitor present;with nursing/sitter in  room Nurse Communication: Mobility status PT Visit Diagnosis: Unsteadiness on feet (R26.81);Other abnormalities of gait and mobility (R26.89);Muscle weakness (generalized) (M62.81);Difficulty in walking, not elsewhere classified (R26.2)     Time: 1127-1201 PT Time Calculation (min) (ACUTE ONLY): 34 min  Charges:  $Therapeutic Activity: 8-22 mins                     Zachary George PT, DPT 12:54 PM,09/16/19   Eldine Rencher Drucilla Chalet 09/16/2019, 12:50 PM

## 2019-09-16 NOTE — Progress Notes (Signed)
PROGRESS NOTE   [name]  HWE:[id number]    DOB: [birth year]    DOA: 07/25/2019  PCP: Kathyrn Drown, MD   I have briefly reviewed patients previous medical records in [facility].  Chief Complaint  Patient presents with  . Shortness of Breath    Brief Narrative:  68 year old male with PMH of GERD and BPH, initially presented on 07/25/2019 with worsening dyspnea after recently testing positive for COVID-19 on 07/22/2019.  He required emergent intubation at [facility] ED.  He was admitted to the [facility] hospital for acute hypoxic respiratory failure secondary to severe ARDS from Covid pneumonia and completed Decadron, Remdesivir and Tocilizumab.  Hospital course complicated by acute kidney injury requiring CRRT, encephalopathy, pneumothorax complicated by persistent air leak and bronchopleural fistula requiring endobronchial blocker and pleur-evac, prolonged mechanical ventilation s/p tracheostomy on 10/30, MSSA bacteremia with negative echocardiogram intermittent pressor support with dialysis, melena with acute blood loss anemia requiring transfusion, IV PPI, empiric octreotide, EGD showed gastric ulcer and nonbleeding visible vessel clipped, transferred to [facility] on 08/26/2023 intermittent HD, subacute right cerebral/cerebellar infarcts on MRI.  PCCM following for tracheostomy management, weekly.  Nephrology on board for HD needs.  VVS plans permanent dialysis access after patient improves overall.  Assessment & Plan:   Principal Problem:   Pneumonia due to COVID-19 virus Active Problems:   BPH (benign prostatic hyperplasia)   Acute respiratory failure due to COVID-19 (HCC)   AKI (acute kidney injury) (HCC)   Fluid overload   Pneumothorax on right   Acute renal failure (ARF) (HCC)   GI bleed   Atrial fibrillation with RVR (HCC)   Constipation   Gastric ulcer with hemorrhage   Acute respiratory failure (HCC)   Chest tube in place   Primary spontaneous  pneumothorax   Acute respiratory distress syndrome (ARDS) due to COVID-19 virus ([place])   Tracheostomy status ([place])   Cerebral thrombosis with cerebral infarction   Advanced care planning/counseling discussion   Goals of care, counseling/discussion   Palliative care by specialist   Sinus tachycardia   Acute blood loss anemia   H. pylori infection   CVA (cerebral vascular accident) ([place])   Anoxic brain injury ([place])   MSSA bacteremia   Positive D dimer   Pseudomonas infection   Infection due to acinetobacter baumannii   Acute hypoxic respiratory failure secondary to severe ARDS from Covid pneumonia, s/p tracheostomy  Completed remdesivir/Decadron/Actemra/convalescent plasma therapy.  ARDS resolved.  Covid testing negative x2 on 11/1 and 11/2.  Stable on trach collar, management per PCCM.  Continue trach collar at 28%.  Using Passy-Muir valve.  Tracheostomy downsized 09/07/2021.  PCCM to following weekly> also would call as needed. PCCM follow up from today appreciated.  S/p chest tube due to pneumothorax, removed 09/08/2019.  TCTS signed off 11/17.  Patient has intermittent thick viscid tracheostomy secretions, continue aggressive pulmonary toilet, per CCM maintain trach size to 6 cuffless due to secretions and debility.  Cough -Patient has been having intractable cough -Concern about if patient has silent aspiration -Chest x-ray shows no acute abnormalities -Patient is spiking low-grade fever of 99.9 -Started the patient on duo nebs -Added budesonide nebs if patient has some bronchitis -Keep the patient on doxycycline  ESRD on hemodialysis  Started off as acute kidney injury complicating stage III CKD.  Has now progressed to ESRD.  Nephrology continues to assist.  ESRD presumed as he has been HD dependent for over a month without signs of renal recovery.  Ongoing TTS schedule, s/p HD 11/21.  Next HD 11/23, Thanksgiving holiday schedule.  Currently dialyzed through TTS,  vascular surgery recommend staged procedure for aVF and would like to wait further until patient gets stronger from his debility.  He is not appropriate for SNF because unable to sit for outpatient HD.  Only options would be LTAC or CIR.  Septic shock, resolved  Right pneumothorax/empyema/necrotic right lower lobe/bronchopleural fistula with large air leak  Chest tube removed by CT surgery on 11/16.  Periodic chest x-ray follow-up.  Appears stable for now.  Chest x-ray 11/17: Unchanged small right basal lateral pneumothorax.  Acute on chronic anemia  Secondary to gastric ulcer, s/p EGD with culprit vessel clipping.  Appears that he has received 2 units PRBC transfusion thus far.  Last last PRBC transfusion on 11/17.  Hemoglobin 8.6.  Follow CBCs closely across dialysis.  No overt GI bleeding.  Continue IV ferric gluconate and Aranesp.  H. pylori positive by EGD biopsy   GI recommends waiting until clinically stable after discharge and starting 2 weeks regimen to prevent ulcer recurrence. GI (per Dr. Cristina Gong 10/27) to follow up in 2-3 months from hospitalization to arrange this and possible f/u EGD.   CVA  Small bilateral cerebral/right cerebellum infarcts 11/3 likely due to hypotension during dialysis, sepsis and deconditioning.  PAF is also potential etiology.  Mental status continues to gradually improve.  Has residual left hemiplegia, dysphagia.  Neurology recommends aspirin 325 mg daily, permissive hypertension.  No anticoagulation given history of GI bleed.  Follow-up in 4 weeks after discharge at stroke clinic NP/GNA.  Continue therapies evaluation and will need to determine disposition i.e. CIR versus LTAC.   Dysphagia  Has right nasal core track.    Speech therapy follow-up appreciated.  Have upgraded to dysphagia 2 diet and nectar thickened liquids.  Discussed with dietitian on 11/17.  Reportedly not eating adequately yet.  She would like to repeat calorie count  again, resume tube feeds at night and reassess in 48 hours.  If ongoing poor oral intake, will need to discuss with family regarding PEG tube.  Anoxic brain injury  As per neurology MRI brain 11/3 consistent with anoxic brain injury.  Seems to be coherent and responds to questions appropriately.  Atrial fibrillation, paroxysmal  Currently in sinus rhythm.  Briefly required amiodarone earlier in the hospitalization.  Continue aspirin.  Not anticoagulation candidate due to GI bleed.  Discontinued telemetry 11/22.  MSSA bacteremia  TTE negative but still high concern for infective endocarditis and hence continue cefazolin until end date of 11/23 i.e. total of 6 weeks.  Pseudomonas and Acinetobacter in BAL  Resolved.  Completed 10 days course of meropenem.  Elevated D-dimer  No full anticoagulation with GI bleed.  Dopplers negative for DVT on 10/8.  Likely related to acute illness/acute infectious etiology.  Pressure Injury 08/24/19 Sacrum Right;Left;Mid Stage II -  Partial thickness loss of dermis presenting as a shallow open ulcer with a red, pink wound bed without slough.  (Active)  08/24/19 0800  Location: Sacrum  Location Orientation: Right;Left;Mid  Staging: Stage II -  Partial thickness loss of dermis presenting as a shallow open ulcer with a red, pink wound bed without slough.  Wound Description (Comments): -- (stage 11 mid sacrum)  Present on Admission: No    Goals of care Guarded prognosis.  Family wish to pursue full scope of treatment.  Palliative medicine assisted with GOC.  Diarrhea Unclear etiology.  Resolved.  Flexi-Seal removed over the  weekend.  Severe debility  Patient seen this morning with rehab team at bedside.  They are evaluating him and communicating with family to see if he is appropriate for CIR.  Adult failure to thrive   Body mass index is 29.73 kg/m.  Nutritional Status Nutrition Problem: Increased nutrient needs Etiology: chronic illness  Signs/Symptoms: estimated needs Interventions: Magic cup, MVI, Tube feeding, Calorie Count, Refer to RD note for recommendations  DVT prophylaxis: SCDs Code Status: DNR Family Communication: Patient's wife, patient Disposition: To be determined.?  CIR versus LTAC.   Consultants:  ID, nephrology, GI, neurology PCCM, Palliative, rehab  Procedures:  Chest tube removed, 11/16 Tunneled HD cath, 11/10 HD, 11/3 Tracheostomy 10/30 EGD 10/17. Gastric ulcer, nonbleeding visible vessel clipped CRRT 10/15 Bronchoscopy with endobronchial ballon, 10/15, repeat 10/16 Right-sided chest tube, 10/14 Right pigtail chest tube--for right PTX 10/13-2/14 Right IJ 10/2-10/10 ETT 10/2  Antimicrobials:  None.   Subjective: Patient seen this morning along with rehab team at bedside.  Denies complaints.  As per rehab, thick viscid secretions from tracheostomy.  Objective:  Vitals:   09/16/19 0913 09/16/19 1204 09/16/19 1215 09/16/19 1217  BP:  (!) 145/89 (!) 147/90 (!) 148/88  Pulse:  (!) 112 (!) 110 (!) 113  Resp:  (!) 21    Temp:  99.9 F (37.7 C)    TempSrc:  Oral    SpO2: 94% 94% 97% 98%  Weight:      Height:        Examination:  General exam: Pleasant middle-age male, moderately built and nourished lying comfortably propped up in bed without distress.  Tracheostomy open to room air.  Minimal thick viscid creamy secretions.Marland Kitchen Respiratory system: Clear to auscultation anteriorly.  Slightly diminished at bases.  No increased work of breathing. Cardiovascular system: S1 & S2 heard, RRR. No JVD, murmurs, rubs, gallops or clicks. No pedal edema.  Has left upper extremity dependent edema, improving.  Trace left lower extremity edema.  Stable. Gastrointestinal system: Abdomen is nondistended, soft and nontender. No organomegaly or masses felt. Normal bowel sounds heard. Central nervous system: Alert and oriented to self.  Responds appropriately to simple questions or instructions.  Appears to  have aphasia and some dysarthria Extremities: Left-sided hemiplegia with grade 0 x 5 power.  Right upper extremity grade 3 x 5 distally, 2 x 5 proximally.  Right lower extremity grade 2 x 5 power. Skin: No rashes, lesions or ulcers Psychiatry: Judgement and insight appear impaired. Mood & affect appropriate.     Data Reviewed: I have personally reviewed following labs and imaging studies   CBC: Recent Labs  Lab 09/10/19 0431 09/13/19 1303 09/14/19 0401  WBC 15.1* 15.5* 12.2*  NEUTROABS 10.6*  --   --   HGB 8.5* 7.2* 8.6*  HCT 26.0* 23.2* 27.6*  MCV 89.3 92.4 93.9  PLT 241 266 465    Basic Metabolic Panel: Recent Labs  Lab 09/10/19 0431 09/13/19 1303  NA 134* 134*  K 3.4* 3.5  CL 95* 95*  CO2 26 26  GLUCOSE 112* 90  BUN 44* 60*  CREATININE 3.18* 4.37*  CALCIUM 8.5* 8.5*  PHOS  --  3.1    Liver Function Tests: Recent Labs  Lab 09/10/19 0431 09/13/19 1303  AST 35  --   ALT 6  --   ALKPHOS 73  --   BILITOT 0.4  --   PROT 6.6  --   ALBUMIN 1.9* 1.7*    CBG: Recent Labs  Lab 09/15/19 2029 09/16/19 0006  09/16/19 0414 09/16/19 0805 09/16/19 1206  GLUCAP 85 118* 96 95 102*    No results found for this or any previous visit (from the past 240 hour(s)).    Radiology Studies: Dg Chest Port 1 View  Result Date: 09/16/2019 CLINICAL DATA:  Cough EXAM: PORTABLE CHEST 1 VIEW COMPARISON:  09/09/2019 FINDINGS: No significant interval change in examination with a large pneumatocele and probable loculated small hydropneumothorax about the right lung base. No new airspace opacity. Tracheostomy. Partially imaged enteric feeding tube. Left neck large bore multi lumen vascular catheter. Cardiomegaly. IMPRESSION: No interval change in large pneumatocele and probable loculated small hydropneumothorax about the right lung base. No new airspace opacity. Electronically Signed   By: Eddie Candle M.D.   On: 09/16/2019 13:03          Scheduled Meds: . aspirin  325 mg  Per Tube Daily  . atorvastatin  40 mg Oral q1800  . chlorhexidine  15 mL Mouth/Throat BID  . Chlorhexidine Gluconate Cloth  6 each Topical Q0600  . Chlorhexidine Gluconate Cloth  6 each Topical Q0600  . darbepoetin (ARANESP) injection - DIALYSIS  100 mcg Intravenous Q Thu-HD  . heparin injection (subcutaneous)  5,000 Units Subcutaneous Q8H  . ipratropium-albuterol  3 mL Nebulization Q6H  . mouth rinse  15 mL Mouth Rinse 10 times per day  . multivitamin  1 tablet Oral QHS  . pantoprazole sodium  40 mg Per Tube Daily  . sucralfate  1 g Oral Q6H   Continuous Infusions: . sodium chloride Stopped (09/15/19 2208)  . dextrose Stopped (08/16/19 1623)  . feeding supplement (NEPRO CARB STEADY) 1,000 mL (09/15/19 2127)  . [START ON 09/17/2019] ferric gluconate (FERRLECIT/NULECIT) IV     Followed by  . [START ON 09/20/2019] ferric gluconate (FERRLECIT/NULECIT) IV       LOS: 3 days     Blake Goya, MD, FACP, SFHM. [facility]  To contact the attending provider between 7A-7P or the covering provider during after hours 7P-7A, please log into the web site [email or web address] and access using universal [place] password for that web site. If you do not have the password, please call the hospital operator.  09/16/2019, 3:34 PM

## 2019-09-16 NOTE — Progress Notes (Signed)
PALLIATIVE NOTE:  Called and left message with wife with hopes of speaking with her and assisting with continued goals of care and navigation through patient's care needs/decisions. Will await a return call or will reach back out to her tomorrow.   Alda Lea, AGPCNP-BC Palliative Medicine Team   NO CHARGE

## 2019-09-16 NOTE — Progress Notes (Signed)
Patient ate one full magic cup, a little while later he says he had an upset stomach (did not last long), patient had a BM x2.  When RN and NT performed pericare, patient had yellow fluid on sheets, seemed to be urine. RN charted output and charted oliguria instead of anuria.

## 2019-09-17 DIAGNOSIS — U071 COVID-19: Secondary | ICD-10-CM | POA: Diagnosis not present

## 2019-09-17 DIAGNOSIS — N170 Acute kidney failure with tubular necrosis: Secondary | ICD-10-CM | POA: Diagnosis not present

## 2019-09-17 DIAGNOSIS — J9600 Acute respiratory failure, unspecified whether with hypoxia or hypercapnia: Secondary | ICD-10-CM | POA: Diagnosis not present

## 2019-09-17 DIAGNOSIS — D62 Acute posthemorrhagic anemia: Secondary | ICD-10-CM | POA: Diagnosis not present

## 2019-09-17 LAB — RENAL FUNCTION PANEL
Albumin: 1.9 g/dL — ABNORMAL LOW (ref 3.5–5.0)
Anion gap: 11 (ref 5–15)
BUN: 38 mg/dL — ABNORMAL HIGH (ref 8–23)
CO2: 26 mmol/L (ref 22–32)
Calcium: 8.6 mg/dL — ABNORMAL LOW (ref 8.9–10.3)
Chloride: 97 mmol/L — ABNORMAL LOW (ref 98–111)
Creatinine, Ser: 4.25 mg/dL — ABNORMAL HIGH (ref 0.61–1.24)
GFR calc Af Amer: 16 mL/min — ABNORMAL LOW (ref >60)
GFR calc non Af Amer: 14 mL/min — ABNORMAL LOW (ref >60)
Glucose, Bld: 128 mg/dL — ABNORMAL HIGH (ref 70–99)
Phosphorus: 2.3 mg/dL — ABNORMAL LOW (ref 2.5–4.6)
Potassium: 3.8 mmol/L (ref 3.5–5.1)
Sodium: 134 mmol/L — ABNORMAL LOW (ref 135–145)

## 2019-09-17 LAB — GLUCOSE, CAPILLARY
Glucose-Capillary: 103 mg/dL — ABNORMAL HIGH (ref 70–99)
Glucose-Capillary: 93 mg/dL (ref 70–99)
Glucose-Capillary: 99 mg/dL (ref 70–99)
Glucose-Capillary: 117 mg/dL — ABNORMAL HIGH (ref 70–99)
Glucose-Capillary: 97 mg/dL (ref 70–99)

## 2019-09-17 MED ORDER — HEPARIN SODIUM (PORCINE) 1000 UNIT/ML IJ SOLN
INTRAMUSCULAR | Status: AC
  Administered 2019-09-17: 3800 [IU]
  Filled 2019-09-17: qty 4

## 2019-09-17 MED ORDER — DARBEPOETIN ALFA 100 MCG/0.5ML IJ SOSY
100.0000 ug | PREFILLED_SYRINGE | INTRAMUSCULAR | Status: DC
  Administered 2019-09-20 – 2019-09-27 (×2): 100 ug via INTRAVENOUS
  Filled 2019-09-17 (×2): qty 0.5

## 2019-09-17 NOTE — Progress Notes (Signed)
Nutrition Follow-up  DOCUMENTATION CODES:   Not applicable  INTERVENTION:   May need to consider increase in TF to meet needs as PO intake is not progressing. Will keep the same rate to stimulate appetite and reassess at later date.   Resume nocturnal feedings: -Nepro @ 65 ml/hr via cortrak tube x 12 hours (2000-0800)   Tube feeding regimen provides 1404 kcal (64% of needs), 63 grams of protein, and 1134 ml of H2O.   NUTRITION DIAGNOSIS:   Increased nutrient needs related to chronic illness as evidenced by estimated needs.  Ongoing  GOAL:   Patient will meet greater than or equal to 90% of their needs  Progressing   MONITOR:   PO intake, Supplement acceptance, Diet advancement, Labs, Weight trends, Skin, I & O's  REASON FOR ASSESSMENT:   Consult Enteral/tube feeding initiation and management  ASSESSMENT:   68 y/o male admitted with PNA and COVID 19  11/18 Diet advanced to Dysphagia I, Honey Thick 11/21- TF d/c 11/22- advanced to dysphagia 2 diet with nectar thick liquids  Calorie count not performed yesterday. RD to d/c as intake remains inadequate. Meal completions charted as 0-50% for his last 6 meals. RD observed lunch tray 35% completed and empty magic cup. Pt complains of food feeling stuck in his esophagus after eating. Encouraged pt to eat slowly. Do not recommend pulling Cortrak tube until pt can meet 75% of needs PO given increased needs for HD and wounds. Pt and wife made aware.   Admission weight: 96 kg Current weight: 94 kg   I/O: +6,441 ml since 11/11 UOP: 100 ml x 24 hrs  Last HD 11/21: 2500 ml net UF  Medications: aranesp, rena-vit Labs: Na 134 (L) Phosphorus 2.3 (L) corrected calcium 10.3 (wdl) CBG 93-108  Diet Order:   Diet Order            DIET DYS 2 Room service appropriate? Yes; Fluid consistency: Nectar Thick  Diet effective now              EDUCATION NEEDS:   No education needs have been identified at this time  Skin:  Skin  Assessment: Skin Integrity Issues: Skin Integrity Issues:: Other (Comment), Incisions, Stage II DTI: - Stage I: - Stage II: perineum, sacrum Incisions: rt lateral lower chest Other: MASD buttocks, penis, open penis sore, lt neck, lt chest  Last BM:  11/23  Height:   Ht Readings from Last 1 Encounters:  09/07/19 5\' 10"  (1.778 m)    Weight:   Wt Readings from Last 1 Encounters:  09/16/19 94 kg    Ideal Body Weight:  75.45 kg  BMI:  Body mass index is 29.74 kg/m.  Estimated Nutritional Needs:   Kcal:  3662-9476  Protein:  130-150 gm  Fluid:  1000 mL plus UOP   [name] RD, LDN Clinical Nutrition Pager # [phone]

## 2019-09-17 NOTE — Progress Notes (Signed)
  Speech Language Pathology Treatment: Dysphagia;Passy Muir Speaking valve  Patient Details Name: [name] MRN: [id number] DOB: Jan 19, 1951 Today's Date: 09/17/2019 Time: 7829-5621 SLP Time Calculation (min) (ACUTE ONLY): 17 min  Assessment / Plan / Recommendation Clinical Impression  PMV was placed for PO intake with Min cues given to initiate verbal communication, as pt tends to use head nods. Pt communicated simple wants/needs throughout session with Min cues. He continues to describe himself as a more selective eater. When trying to elicit what food he would eat here, he says that he will still only eat it if its "good." He ate an entire magic cup with intermittent sips of water. He still demonstrates intermittent coughing, but overall this subjectively seems to be less than on previous dates (and noted not just during PO intake. Tried to offer more solid foods on his tray as his oral phase is looking more timely, but he declined. Would continue current diet for now.   HPI HPI: 68 year old with a history of GERD and BPH who presented to [facility], ED with S OB and was found to be hypoxic.  He was initially diagnosed with COVID-19 September 20.  1 to 2 days prior to his presentation he developed worsening shortness of breath with anorexia. Admitted on 10/3, intubated.  EGD on 10/17 showed normal esophagus but excessive gastric fluid. CT scan of the chest on 10/21 showed findings consistent with an empyema as well as persistent pneumothorax.  Bedside bronchoscopy revealed material in the airway. Trached on 10/30. On ATC by 10/31.Additionally MRI on 11/4 showed There is cortical restricted diffusion posteriorly in the right frontal lobe consistent with acute infarct. Additional smaller regions of diffusion abnormality are present in the posterior left frontal lobe, anterior left frontal lobe, and right greater than left medial parietal lobes as well as right cerebellum.       SLP Plan  Continue  with current plan of care       Recommendations  Diet recommendations: Dysphagia 2 (fine chop);Nectar-thick liquid Liquids provided via: Cup;Straw Medication Administration: Crushed with puree Supervision: Staff to assist with self feeding;Full supervision/cueing for compensatory strategies Compensations: Slow rate;Small sips/bites Postural Changes and/or Swallow Maneuvers: Seated upright 90 degrees;Upright 30-60 min after meal      Patient may use Passy-Muir Speech Valve: Intermittently with supervision;During all therapies with supervision;During PO intake/meals PMSV Supervision: Intermittent MD: Please consider changing trach tube to : Smaller size         Oral Care Recommendations: Oral care BID Follow up Recommendations: Inpatient Rehab SLP Visit Diagnosis: Dysphagia, oropharyngeal phase (R13.12) Plan: Continue with current plan of care       GO                Venita Sheffield Annis Lagoy 09/17/2019, 9:32 AM  Pollyann Glen, M.A. [place] [facility] Environmental education officer [phone] Office [phone]

## 2019-09-17 NOTE — Progress Notes (Signed)
Subjective:  Wife in room , questions answered , / for HD today  / Pt  Has no cos   Objective Vital signs in last 24 hours: Vitals:   09/17/19 1148 09/17/19 1311 09/17/19 1324 09/17/19 1330  BP:  (!) 146/86 (!) 147/82 134/85  Pulse: 97 (!) 105 (!) 111 (!) 109  Resp: 20 20    Temp:  98.5 F (36.9 C)    TempSrc:  Oral    SpO2: 98% 94%    Weight:  89 kg    Height:       Weight change: 0.015 kg  Physical Exam: General:Alert chronically ill male  NAD  Heart: RRR, no m, r, g Lungs:CTA , with TrachCollar Abdomen: soft, non tender Extremities:trace bipedaledema - bilat boots  Dialysis Access: left TDC   Problem/Plan: 1. Pulm-SP Acute Hypoxemia Respfailure with COVID -19-Chest tube out. Still with trach- Being  downsized- no abx- Per CCM 2. ESRD-TTS here, HD holiday schedule. MWS Presumed as has been HD dep for 5 weeks without any current signs of renal recovery- Has TDC. Vein mapping done- Will likely needs staged  procedure for AVF- Want to wait for patient to get stronger. 3. Anemia-- also with GIB- no hep with HD- on ESA- iron stores low, will replete as well- supportive care - transfused one unit on 11/17- last hgb 8.6 pend hd today  4. Secondary hyperparathyroidism- phos is3.1> 2.3- no binders 5. HTN/volume-no scheduled BP meds- trying to challenge volume with success 6. Bilatcerebral cortex and right cerebellum small acute to subacute infarctswithNeuromuscularweakness/encephalopathy  See disposition  6. Dispo-Cannot do OP HD with trach, also is extremely debilitated- cannot turn in bed, cant sit up for OP HD - being told that LTAC is not appropriate due to trach but maybe due to this issue of debility- not able to do OP HD or get AVF at this time- still too debilitated/ noted Rehab Coordinator notes=" wife  Planning discussion with children  Possibility of care at home " But can he travel back and forth to OP HD and do HD in a chair for  4hrs?  These are necessary requirements for dc to home or to SNF.   Ernest Haber, PA-C [facility] Beeper [phone] 09/17/2019,2:33 PM  LOS: 54 days   Labs: Basic Metabolic Panel: Recent Labs  Lab 09/13/19 1303 09/17/19 1115  NA 134* 134*  K 3.5 3.8  CL 95* 97*  CO2 26 26  GLUCOSE 90 128*  BUN 60* 38*  CREATININE 4.37* 4.25*  CALCIUM 8.5* 8.6*  PHOS 3.1 2.3*   Liver Function Tests: Recent Labs  Lab 09/13/19 1303 09/17/19 1115  ALBUMIN 1.7* 1.9*   No results for input(s): LIPASE, AMYLASE in the last 168 hours. No results for input(s): AMMONIA in the last 168 hours. CBC: Recent Labs  Lab 09/13/19 1303 09/14/19 0401  WBC 15.5* 12.2*  HGB 7.2* 8.6*  HCT 23.2* 27.6*  MCV 92.4 93.9  PLT 266 295   Cardiac Enzymes: No results for input(s): CKTOTAL, CKMB, CKMBINDEX, TROPONINI in the last 168 hours. CBG: Recent Labs  Lab 09/16/19 2012 09/16/19 2352 09/17/19 0418 09/17/19 0713 09/17/19 1215  GLUCAP 108* 119* 103* 93 99    Studies/Results: Dg Chest Port 1 View  Result Date: 09/16/2019 CLINICAL DATA:  Cough EXAM: PORTABLE CHEST 1 VIEW COMPARISON:  09/09/2019 FINDINGS: No significant interval change in examination with a large pneumatocele and probable loculated small hydropneumothorax about the right lung base. No new airspace opacity. Tracheostomy. Partially  imaged enteric feeding tube. Left neck large bore multi lumen vascular catheter. Cardiomegaly. IMPRESSION: No interval change in large pneumatocele and probable loculated small hydropneumothorax about the right lung base. No new airspace opacity. Electronically Signed   By: Eddie Candle M.D.   On: 09/16/2019 13:03   Medications: . sodium chloride Stopped (09/15/19 2208)  . dextrose Stopped (08/16/19 1623)  . doxycycline (VIBRAMYCIN) IV 100 mg (09/17/19 3013)  . feeding supplement (NEPRO CARB STEADY) 1,000 mL (09/15/19 2127)  . ferric gluconate (FERRLECIT/NULECIT) IV     Followed by  .  [START ON 09/20/2019] ferric gluconate (FERRLECIT/NULECIT) IV     . aspirin  325 mg Per Tube Daily  . atorvastatin  40 mg Oral q1800  . budesonide (PULMICORT) nebulizer solution  0.5 mg Nebulization BID  . chlorhexidine  15 mL Mouth/Throat BID  . Chlorhexidine Gluconate Cloth  6 each Topical Q0600  . Chlorhexidine Gluconate Cloth  6 each Topical Q0600  . [START ON 09/20/2019] darbepoetin (ARANESP) injection - DIALYSIS  100 mcg Intravenous Q Sat-HD  . heparin injection (subcutaneous)  5,000 Units Subcutaneous Q8H  . ipratropium-albuterol  3 mL Nebulization Q6H  . mouth rinse  15 mL Mouth Rinse 10 times per day  . multivitamin  1 tablet Oral QHS  . pantoprazole sodium  40 mg Per Tube Daily  . sucralfate  1 g Oral Q6H

## 2019-09-17 NOTE — Progress Notes (Signed)
Inpatient Rehab Admissions Coordinator:   I spoke to pt's wife.  She and the family are prepared to provide care for patient at discharge from [facility], if needed.  Pt will need to have outpatient dialysis seat prior to CIR, which is also a barrier for placement at a SNF or home.  Will continue to follow for timing of possible admission.   Shann Medal, PT, DPT Admissions Coordinator [phone] 09/17/19  4:09 PM

## 2019-09-17 NOTE — Plan of Care (Signed)
  Problem: Education: Goal: Knowledge of risk factors and measures for prevention of condition will improve Outcome: Progressing   

## 2019-09-17 NOTE — Progress Notes (Signed)
PROGRESS NOTE   [name]  ZOX:[id number]    DOB: October 12, 1951    DOA: 07/25/2019  PCP: Kathyrn Drown, MD   I have briefly reviewed patients previous medical records in [facility].  Chief Complaint  Patient presents with  . Shortness of Breath    Brief Narrative:  68 year old male with PMH of GERD and BPH, initially presented on 07/25/2019 with worsening dyspnea after recently testing positive for COVID-19 on 07/22/2019.  He required emergent intubation at [facility] ED.  He was admitted to the [facility] hospital for acute hypoxic respiratory failure secondary to severe ARDS from Covid pneumonia and completed Decadron, Remdesivir and Tocilizumab.  Hospital course complicated by acute kidney injury requiring CRRT, encephalopathy, pneumothorax complicated by persistent air leak and bronchopleural fistula requiring endobronchial blocker and pleur-evac, prolonged mechanical ventilation s/p tracheostomy on 10/30, MSSA bacteremia with negative echocardiogram intermittent pressor support with dialysis, melena with acute blood loss anemia requiring transfusion, IV PPI, empiric octreotide, EGD showed gastric ulcer and nonbleeding visible vessel clipped, transferred to [facility] on 08/26/2023 intermittent HD, subacute right cerebral/cerebellar infarcts on MRI.  PCCM following for tracheostomy management, weekly.  Nephrology on board for HD needs.  VVS plans permanent dialysis access after patient improves overall.  Assessment & Plan:   Principal Problem:   Pneumonia due to COVID-19 virus Active Problems:   BPH (benign prostatic hyperplasia)   Acute respiratory failure due to COVID-19 (HCC)   AKI (acute kidney injury) (HCC)   Fluid overload   Pneumothorax on right   Acute renal failure (ARF) (HCC)   GI bleed   Atrial fibrillation with RVR (HCC)   Constipation   Gastric ulcer with hemorrhage   Acute respiratory failure (HCC)   Chest tube in place   Primary spontaneous  pneumothorax   Acute respiratory distress syndrome (ARDS) due to COVID-19 virus ([place])   Tracheostomy status ([place])   Cerebral thrombosis with cerebral infarction   Advanced care planning/counseling discussion   Goals of care, counseling/discussion   Palliative care by specialist   Sinus tachycardia   Acute blood loss anemia   H. pylori infection   CVA (cerebral vascular accident) ([place])   Anoxic brain injury ([place])   MSSA bacteremia   Positive D dimer   Pseudomonas infection   Infection due to acinetobacter baumannii   Acute hypoxic respiratory failure secondary to severe ARDS from Covid pneumonia, s/p tracheostomy  Completed remdesivir/Decadron/Actemra/convalescent plasma therapy.  ARDS resolved.  Covid testing negative x2 on 11/1 and 11/2.  Stable on trach collar, management per PCCM.  Continue trach collar at 28%.  Using Passy-Muir valve.  Tracheostomy downsized 09/07/2021.  PCCM to following weekly> also would call as needed. PCCM follow up from today appreciated.  S/p chest tube due to pneumothorax, removed 09/08/2019.  TCTS signed off 11/17.  Patient has intermittent thick viscid tracheostomy secretions, continue aggressive pulmonary toilet, per CCM maintain trach size to 6 cuffless due to secretions and debility.  Cough -Patient has been having intractable cough -Concern about if patient has silent aspiration -Chest x-ray shows no acute abnormalities -Patient is spiking low-grade fever of 99.9 -Started the patient on duo nebs -Added budesonide nebs if patient has some bronchitis -Keep the patient on doxycycline  ESRD on hemodialysis  Started off as acute kidney injury complicating stage III CKD.  Has now progressed to ESRD.  Nephrology continues to assist.  ESRD presumed as he has been HD dependent for over a month without signs of renal recovery.  Ongoing TTS schedule, s/p HD 11/21.  Next HD 11/23, Thanksgiving holiday schedule.  Currently dialyzed through TTS,  vascular surgery recommend staged procedure for aVF and would like to wait further until patient gets stronger from his debility.  He is not appropriate for SNF because unable to sit for outpatient HD.  Only options would be LTAC or CIR.  Septic shock, resolved  Right pneumothorax/empyema/necrotic right lower lobe/bronchopleural fistula with large air leak  Chest tube removed by CT surgery on 11/16.  Periodic chest x-ray follow-up.  Appears stable for now.  Chest x-ray 11/17: Unchanged small right basal lateral pneumothorax.  Acute on chronic anemia  Secondary to gastric ulcer, s/p EGD with culprit vessel clipping.  Appears that he has received 2 units PRBC transfusion thus far.  Last last PRBC transfusion on 11/17.  Hemoglobin 8.6.  Follow CBCs closely across dialysis.  No overt GI bleeding.  Continue IV ferric gluconate and Aranesp.  H. pylori positive by EGD biopsy   GI recommends waiting until clinically stable after discharge and starting 2 weeks regimen to prevent ulcer recurrence. GI (per Dr. Cristina Gong 10/27) to follow up in 2-3 months from hospitalization to arrange this and possible f/u EGD.   CVA  Small bilateral cerebral/right cerebellum infarcts 11/3 likely due to hypotension during dialysis, sepsis and deconditioning.  PAF is also potential etiology.  Mental status continues to gradually improve.  Has residual left hemiplegia, dysphagia.  Neurology recommends aspirin 325 mg daily, permissive hypertension.  No anticoagulation given history of GI bleed.  Follow-up in 4 weeks after discharge at stroke clinic NP/[facility].  Continue therapies evaluation and will need to determine disposition i.e. CIR versus LTAC.   Dysphagia  Has right nasal core track.    Speech therapy follow-up appreciated.  Have upgraded to dysphagia 2 diet and nectar thickened liquids.  Discussed with dietitian on 11/17.  Reportedly not eating adequately yet.  She would like to repeat calorie count  again, resume tube feeds at night and reassess in 48 hours.  If ongoing poor oral intake, will need to discuss with family regarding PEG tube.  Anoxic brain injury  As per neurology MRI brain 11/3 consistent with anoxic brain injury.  Seems to be coherent and responds to questions appropriately.  Atrial fibrillation, paroxysmal  Currently in sinus rhythm.  Briefly required amiodarone earlier in the hospitalization.  Continue aspirin.  Not anticoagulation candidate due to GI bleed.  Discontinued telemetry 11/22.  MSSA bacteremia  TTE negative but still high concern for infective endocarditis and hence continue cefazolin until end date of 11/23 i.e. total of 6 weeks.  Pseudomonas and Acinetobacter in BAL  Resolved.  Completed 10 days course of meropenem.  Elevated D-dimer  No full anticoagulation with GI bleed.  Dopplers negative for DVT on 10/8.  Likely related to acute illness/acute infectious etiology.  Pressure Injury 08/24/19 Sacrum Right;Left;Mid Stage II -  Partial thickness loss of dermis presenting as a shallow open ulcer with a red, pink wound bed without slough.  (Active)  08/24/19 0800  Location: Sacrum  Location Orientation: Right;Left;Mid  Staging: Stage II -  Partial thickness loss of dermis presenting as a shallow open ulcer with a red, pink wound bed without slough.  Wound Description (Comments): -- (stage 11 mid sacrum)  Present on Admission: No    Goals of care Guarded prognosis.  Family wish to pursue full scope of treatment.  Palliative medicine assisted with GOC.  Diarrhea Unclear etiology.  Resolved.  Flexi-Seal removed over the  weekend.  Severe debility  Patient seen this morning with rehab team at bedside.  They are evaluating him and communicating with family to see if he is appropriate for CIR.  Adult failure to thrive   Body mass index is 29.74 kg/m.  Nutritional Status Nutrition Problem: Increased nutrient needs Etiology: chronic illness  Signs/Symptoms: estimated needs Interventions: Magic cup, MVI, Tube feeding, Calorie Count, Refer to RD note for recommendations  DVT prophylaxis: SCDs Code Status: DNR Family Communication: Patient's wife, patient Disposition: To be determined.?  CIR versus LTAC.   Consultants:  ID, nephrology, GI, neurology PCCM, Palliative, rehab  Procedures:  Chest tube removed, 11/16 Tunneled HD cath, 11/10 HD, 11/3 Tracheostomy 10/30 EGD 10/17. Gastric ulcer, nonbleeding visible vessel clipped CRRT 10/15 Bronchoscopy with endobronchial ballon, 10/15, repeat 10/16 Right-sided chest tube, 10/14 Right pigtail chest tube--for right PTX 10/13-2/14 Right IJ 10/2-10/10 ETT 10/2  Antimicrobials:  None.   Subjective: Patient states improvement of the cough from yesterday.  Patient's wife is at bedside.  Agrees to do bed exercises.  Objective:  Vitals:   09/17/19 0300 09/17/19 0419 09/17/19 0821 09/17/19 0900  BP:  (!) 155/87  (!) 154/88  Pulse: 98 (!) 106 (!) 105 98  Resp: _0 (!) 21  Temp:  98.1 F (36.7 C)  98 F (36.7 C)  TempSrc:    Oral  SpO2: 95% 93% 96% 98%  Weight:      Height:        Examination:  General exam: Pleasant middle-age male, moderately built and nourished lying comfortably propped up in bed without distress.  Tracheostomy open to room air.  Minimal thick viscid creamy secretions.Marland Kitchen Respiratory system: Clear to auscultation anteriorly.  Slightly diminished at bases.  No increased work of breathing. Cardiovascular system: S1 & S2 heard, RRR. No JVD, murmurs, rubs, gallops or clicks. No pedal edema.  Has left upper extremity dependent edema, improving.  Trace left lower extremity edema.  Stable. Gastrointestinal system: Abdomen is nondistended, soft and nontender. No organomegaly or masses felt. Normal bowel sounds heard. Central nervous system: Alert and oriented to self.  Responds appropriately to simple questions or instructions.  Appears to have aphasia  and some dysarthria Extremities: Left-sided hemiplegia with grade 0 x 5 power.  Right upper extremity grade 3 x 5 distally, 2 x 5 proximally.  Right lower extremity grade 2 x 5 power. Skin: No rashes, lesions or ulcers Psychiatry: Judgement and insight appear impaired. Mood & affect appropriate.     Data Reviewed: I have personally reviewed following labs and imaging studies   CBC: Recent Labs  Lab 09/13/19 1303 09/14/19 0401  WBC 15.5* 12.2*  HGB 7.2* 8.6*  HCT 23.2* 27.6*  MCV 92.4 93.9  PLT 266 030    Basic Metabolic Panel: Recent Labs  Lab 09/13/19 1303  NA 134*  K 3.5  CL 95*  CO2 26  GLUCOSE 90  BUN 60*  CREATININE 4.37*  CALCIUM 8.5*  PHOS 3.1    Liver Function Tests: Recent Labs  Lab 09/13/19 1303  ALBUMIN 1.7*    CBG: Recent Labs  Lab 09/16/19 1636 09/16/19 2012 09/16/19 2352 09/17/19 0418 09/17/19 0713  GLUCAP 108* 108* 119* 103* 93    No results found for this or any previous visit (from the past 240 hour(s)).    Radiology Studies: Dg Chest Port 1 View  Result Date: 09/16/2019 CLINICAL DATA:  Cough EXAM: PORTABLE CHEST 1 VIEW COMPARISON:  09/09/2019 FINDINGS: No significant interval change in examination  with a large pneumatocele and probable loculated small hydropneumothorax about the right lung base. No new airspace opacity. Tracheostomy. Partially imaged enteric feeding tube. Left neck large bore multi lumen vascular catheter. Cardiomegaly. IMPRESSION: No interval change in large pneumatocele and probable loculated small hydropneumothorax about the right lung base. No new airspace opacity. Electronically Signed   By: Eddie Candle M.D.   On: 09/16/2019 13:03          Scheduled Meds: . aspirin  325 mg Per Tube Daily  . atorvastatin  40 mg Oral q1800  . budesonide (PULMICORT) nebulizer solution  0.5 mg Nebulization BID  . chlorhexidine  15 mL Mouth/Throat BID  . Chlorhexidine Gluconate Cloth  6 each Topical Q0600  .  Chlorhexidine Gluconate Cloth  6 each Topical Q0600  . [START ON 09/20/2019] darbepoetin (ARANESP) injection - DIALYSIS  100 mcg Intravenous Q Sat-HD  . heparin injection (subcutaneous)  5,000 Units Subcutaneous Q8H  . ipratropium-albuterol  3 mL Nebulization Q6H  . mouth rinse  15 mL Mouth Rinse 10 times per day  . multivitamin  1 tablet Oral QHS  . pantoprazole sodium  40 mg Per Tube Daily  . sucralfate  1 g Oral Q6H   Continuous Infusions: . sodium chloride Stopped (09/15/19 2208)  . dextrose Stopped (08/16/19 1623)  . doxycycline (VIBRAMYCIN) IV 100 mg (09/17/19 7711)  . feeding supplement (NEPRO CARB STEADY) 1,000 mL (09/15/19 2127)  . ferric gluconate (FERRLECIT/NULECIT) IV     Followed by  . [START ON 09/20/2019] ferric gluconate (FERRLECIT/NULECIT) IV       LOS: 22 days     Shaylie Eklund, MD, FACP, SFHM. [facility]  To contact the attending provider between 7A-7P or the covering provider during after hours 7P-7A, please log into the web site [email or web address] and access using universal [place] password for that web site. If you do not have the password, please call the hospital operator.  09/17/2019, 10:30 AM

## 2019-09-17 NOTE — Progress Notes (Signed)
Pt off the floor.  RT will check on him once he returns to his room. RT will continue to monitor.

## 2019-09-17 NOTE — Progress Notes (Signed)
PT Cancellation Note  Patient Details Name: [name] MRN: [id number] DOB: [birth year]   Cancelled Treatment:    Reason Eval/Treat Not Completed: Other (comment).  Awaiting transport to HD and is eating presently.  Nursing notified that pt is struggling with the meal he has, feels it is getting stuck.  Pt is with his family.   Ramond Dial 09/17/2019, 12:07 PM   Mee Hives, PT MS Acute Rehab Dept. Number: [place]

## 2019-09-18 DIAGNOSIS — D62 Acute posthemorrhagic anemia: Secondary | ICD-10-CM | POA: Diagnosis not present

## 2019-09-18 DIAGNOSIS — J1289 Other viral pneumonia: Secondary | ICD-10-CM | POA: Diagnosis not present

## 2019-09-18 DIAGNOSIS — N170 Acute kidney failure with tubular necrosis: Secondary | ICD-10-CM | POA: Diagnosis not present

## 2019-09-18 DIAGNOSIS — U071 COVID-19: Secondary | ICD-10-CM | POA: Diagnosis not present

## 2019-09-18 LAB — RENAL FUNCTION PANEL
Albumin: 1.8 g/dL — ABNORMAL LOW (ref 3.5–5.0)
Anion gap: 10 (ref 5–15)
BUN: 31 mg/dL — ABNORMAL HIGH (ref 8–23)
CO2: 27 mmol/L (ref 22–32)
Calcium: 8.6 mg/dL — ABNORMAL LOW (ref 8.9–10.3)
Chloride: 97 mmol/L — ABNORMAL LOW (ref 98–111)
Creatinine, Ser: 3.52 mg/dL — ABNORMAL HIGH (ref 0.61–1.24)
GFR calc Af Amer: 20 mL/min — ABNORMAL LOW (ref >60)
GFR calc non Af Amer: 17 mL/min — ABNORMAL LOW (ref >60)
Glucose, Bld: 105 mg/dL — ABNORMAL HIGH (ref 70–99)
Phosphorus: 2.2 mg/dL — ABNORMAL LOW (ref 2.5–4.6)
Potassium: 4.1 mmol/L (ref 3.5–5.1)
Sodium: 134 mmol/L — ABNORMAL LOW (ref 135–145)

## 2019-09-18 LAB — GLUCOSE, CAPILLARY
Glucose-Capillary: 101 mg/dL — ABNORMAL HIGH (ref 70–99)
Glucose-Capillary: 103 mg/dL — ABNORMAL HIGH (ref 70–99)
Glucose-Capillary: 109 mg/dL — ABNORMAL HIGH (ref 70–99)
Glucose-Capillary: 116 mg/dL — ABNORMAL HIGH (ref 70–99)
Glucose-Capillary: 84 mg/dL (ref 70–99)
Glucose-Capillary: 93 mg/dL (ref 70–99)
Glucose-Capillary: 96 mg/dL (ref 70–99)

## 2019-09-18 LAB — CBC
HCT: 24.1 % — ABNORMAL LOW (ref 39.0–52.0)
Hemoglobin: 7.3 g/dL — ABNORMAL LOW (ref 13.0–17.0)
MCH: 28.6 pg (ref 26.0–34.0)
MCHC: 30.3 g/dL (ref 30.0–36.0)
MCV: 94.5 fL (ref 80.0–100.0)
Platelets: 311 10*3/uL (ref 150–400)
RBC: 2.55 MIL/uL — ABNORMAL LOW (ref 4.22–5.81)
RDW: 15.1 % (ref 11.5–15.5)
WBC: 12.6 10*3/uL — ABNORMAL HIGH (ref 4.0–10.5)
nRBC: 0 % (ref 0.0–0.2)

## 2019-09-18 MED ORDER — DEXTROSE 5 % IV SOLN
INTRAVENOUS | Status: DC
  Administered 2019-09-19 – 2019-09-22 (×5): via INTRAVENOUS

## 2019-09-18 MED ORDER — SODIUM BICARBONATE 650 MG PO TABS
650.0000 mg | ORAL_TABLET | Freq: Once | ORAL | Status: AC
  Administered 2019-09-18: 650 mg
  Filled 2019-09-18: qty 1

## 2019-09-18 MED ORDER — IPRATROPIUM-ALBUTEROL 0.5-2.5 (3) MG/3ML IN SOLN
3.0000 mL | Freq: Two times a day (BID) | RESPIRATORY_TRACT | Status: DC
  Administered 2019-09-18 – 2019-09-22 (×7): 3 mL via RESPIRATORY_TRACT
  Filled 2019-09-18 (×8): qty 3

## 2019-09-18 MED ORDER — PANCRELIPASE (LIP-PROT-AMYL) 10440-39150 UNITS PO TABS
20880.0000 [IU] | ORAL_TABLET | Freq: Once | ORAL | Status: AC
  Administered 2019-09-18: 20880 [IU]
  Filled 2019-09-18: qty 2

## 2019-09-18 NOTE — Progress Notes (Addendum)
NAME:  [name], MRN:  [id number], DOB:  02/17/1951, LOS: 80 ADMISSION DATE:  07/25/2019, CONSULTATION DATE:  10/3 REFERRING MD:  Nadara Mustard, CHIEF COMPLAINT:  Dyspnea   Brief History   68 y/o male diagnosed with COVID on 9/29 presented to the [facility] ED on 10/2 with dyspnea, hypoxemia requiring intubation.  Admitted to [facility], developed AKI requiring CRRT, pneumothorax requiring chest tube and had a large bronchopleural fistula treated for 10 days with a bronchial blocker.  Tracheostomy placed on 10/30.  To [facility] 11/1 for IHD.  Chest tube to suction, improvement in air leak.  Waterseal 11/13  Past Medical History  GERD  Significant Hospital Events   10/03 Admit to [facility] from [facility] ER, start decadron and remdesivir, given tociluzimab; prone positioning 10/04 convalescent plasma 10/05 stop prone positioning 10/06 convalescent plasma; prone positioning again 10/09 start ABx 10/10 MSSA bacteremia, CVL d/ced; ID consulted; increase OG tube outpt 10/11 vent weaning trial started 10/13 fever, pneumothorax, pig tail chest tube placed 10/14 worsening hypotension and renal fx >> consulted nephrology; worsening PTX >> replaced chest tube; GNR in sputum >> ABx changed 10/15 start CRRT; endobronchial blocker placed for persistent air leak 10/16 endobronchial blocker repositioned, changed chest tube to water seal; melana with ABLA >> GI consulted; transfuse PRBC 10/17 persistent air leak, increased WOB; start nimbex gtt >> air leak decreased; EGD; A fib with RVR >> start amiodarone 10/18 transfuse PRBC; GI s/o 10/20 resume heparin gtt 10/21 stopped nimbex, chest tube to suction, stopped heparin drip because Hgb dropped again 10/22 TPA in chest tube, repositioned endobronchial blocker 10/23 deflated endobronchial balloon 10/24 small air leak  10/25 removed bronchial blocker 10/30 tracheostomy  11/1 D/C CRRT 11/2 Transfer to [facility] 11/3 Started on IHD 11/7 trach sutures removed 11/4 present, weaning on ATC  11/11 required phenylephrine to tolerate iHD 11/13 chest tube to waterseal  Consults:  ID Nephrology Gastroenterology PCCM  Procedures:  10/2 R IJ CVL >> 10/10 10/13 Rt pig tail chest tube >> 10/14 10/14 R 32F chest tube >> 10/14 L IJ HD cath >> 11/10 10/30 tracheostomy >>  11/10 tunneled HD cath >>  Significant Diagnostic Tests:  10/2 CT angiogram chest >> extensive bilateral airspace disease predominantly posterior 10/8 doppler legs b/l >> no DVT 10/15 renal u/s >> normal 10/17 EGD >> non bleeding gastric ulcer 10/19 Echo >> EF 60 to 65%, hyperdynamic LV with small LVOT gradient 10/22 CT chest >> large collection of air in pleural space with fluid, pneumonia bilaterally R>L endobronchial blocker in place, chest tube anterior 10/31 CXR >> persistent small right pneumothorax despite chest in adequate position MRI brain 11/3 >> small acute to subacute infarcts involving bilateral cerebral cortex and right cerebellum.  Large b/l mastoid effusions  Micro Data:  9/20 SARS COV 2 >> POSITIVE 10/2 blood >> negative 10/9 blood >> MSSA 2/4 10/9 resp >> MSSA, Pseudomonas 10/13 blood >> negative 10/21 bronch wash bacterial >> pseduomonas, acinetobacter baumannii 10/21 bronch wash fungal >  10/21 bronch wash aspergillus antigen> negative 10/24 bronchoscopy wash> pseudomonas 10/28 Blood cx>>> negative  10/28 Sputum cx>>> Pseudomonas 11/1 SARS COV 2 > negative 11/2 SARS CoV2 >> negative   Antimicrobials:  10/3 remdesivir > 10/6 10/3 actemra 10/3 decadron > 10/12 10/4 convalescent plasma 10/6 convalescent plasma  10/9 vancomycin > 10/10 10/9 cefepime > 10/10 10/10 ancef > 10/13 10/13 cefepime >10/20 10/14 anidulofungin > 10/15  10/20 ancef > 10/23 10/23 meropenem > >>>off  10/21 anidulofungin> 10/22 10/22 voriconazole > 10/23  10/28 Merrem >>11/2 10/28 Vanc >>10/30  10/31 Ancef >> Tentative stop date 11/23   Interim history/subjective:  No events overnight  Remains on 28% ATC States he is not sleeping well. He is awake and wearing PMV to converse Secretions remain thick , light yellow and copious Cough is getting stronger Pt. Has a chronic cough Tolerating  PMV while awake  Objective   Blood pressure (!) 141/84, pulse (!) 105, temperature 97.9 F (36.6 C), temperature source Oral, resp. rate (!) 26, height 5\' 10"  (1.778 m), weight 86.8 kg, SpO2 92 %.    FiO2 (%):  [28 %] 28 %   Intake/Output Summary (Last 24 hours) at 09/18/2019 1006 Last data filed at 09/18/2019 0813 Gross per 24 hour  Intake 190 ml  Output 3000 ml  Net -2810 ml   Filed Weights   09/17/19 1311 09/17/19 1658 09/17/19 2033  Weight: 89 kg 86.8 kg 86.8 kg   Examination: General: Chronically ill appearing male at this point, NAD HEENT: [place]/AT, PERRL, EOM-I and MMM, size 8 cuffless trach in place Neuro: Very weak but alert and interactive, following some commands, speaks with PMV CV: RRR, Nl S1/S2 and -M/R/G PULM: Bilateral chest excursion, Coarse throughout, diminished per bases GI: Soft, NT, ND and +BS Extremities: trace edema and -tenderness Skin: no rashes or lesions, warm dry and intact    Discussed with PCCM-NP  Assessment & Plan:   Acute respiratory failure with hypoxemia - 2/2 COVID-19 (s/p remdsevir, actemra, convalescent plasma, steroids)  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  Now with persistent airleak / BPF despite endobronchial blocker placement (removed 10/25 due to MSSA bacteremia) - off MV since 11/2 Current Trach is 8 mm uncuffed P: Sats 92% on 28% ATC Titrate FiO2 for sat of 88-92%, if to RA then insure humidification please PMV per SLP>> tolerating while awake Maintain trach to size 8 cuffless given secretions and physical debility PCCM will continue to follow Trach change per protocol No capping trials at this time Plan is to eventually get to size 4 cuffless then begin capping trials once  secretions are less of an issue  Consider down sizing to a #6 Cuffless once  Secretions are better controlled and less thick  25 minutes CC APP time  Magdalen Spatz, MSN, AGACNP-BC [place] Pager # [phone] After 4 pm please call [phone] 09/18/2019 10:07 AM

## 2019-09-18 NOTE — Progress Notes (Signed)
PROGRESS NOTE   [name]  JWJ:[id number]    DOB: 27-Aug-1951    DOA: 07/25/2019  PCP: Kathyrn Drown, MD   I have briefly reviewed patients previous medical records in [facility].  Chief Complaint  Patient presents with  . Shortness of Breath    Brief Narrative:  68 year old male with PMH of GERD and BPH, initially presented on 07/25/2019 with worsening dyspnea after recently testing positive for COVID-19 on 07/22/2019.  He required emergent intubation at [facility] ED.  He was admitted to the [facility] hospital for acute hypoxic respiratory failure secondary to severe ARDS from Covid pneumonia and completed Decadron, Remdesivir and Tocilizumab.  Hospital course complicated by acute kidney injury requiring CRRT, encephalopathy, pneumothorax complicated by persistent air leak and bronchopleural fistula requiring endobronchial blocker and pleur-evac, prolonged mechanical ventilation s/p tracheostomy on 10/30, MSSA bacteremia with negative echocardiogram intermittent pressor support with dialysis, melena with acute blood loss anemia requiring transfusion, IV PPI, empiric octreotide, EGD showed gastric ulcer and nonbleeding visible vessel clipped, transferred to [facility] on 08/26/2023 intermittent HD, subacute right cerebral/cerebellar infarcts on MRI.  PCCM following for tracheostomy management, weekly.  Nephrology on board for HD needs.  VVS plans permanent dialysis access after patient improves overall.  Assessment & Plan:   Principal Problem:   Pneumonia due to COVID-19 virus Active Problems:   BPH (benign prostatic hyperplasia)   Acute respiratory failure due to COVID-19 (HCC)   AKI (acute kidney injury) (HCC)   Fluid overload   Pneumothorax on right   Acute renal failure (ARF) (HCC)   GI bleed   Atrial fibrillation with RVR (HCC)   Constipation   Gastric ulcer with hemorrhage   Acute respiratory failure (HCC)   Chest tube in place   Primary spontaneous  pneumothorax   Acute respiratory distress syndrome (ARDS) due to COVID-19 virus ([place])   Tracheostomy status ([place])   Cerebral thrombosis with cerebral infarction   Advanced care planning/counseling discussion   Goals of care, counseling/discussion   Palliative care by specialist   Sinus tachycardia   Acute blood loss anemia   H. pylori infection   CVA (cerebral vascular accident) ([place])   Anoxic brain injury ([place])   MSSA bacteremia   Positive D dimer   Pseudomonas infection   Infection due to acinetobacter baumannii   Acute hypoxic respiratory failure secondary to severe ARDS from Covid pneumonia, s/p tracheostomy  Completed remdesivir/Decadron/Actemra/convalescent plasma therapy.  ARDS resolved.  Covid testing negative x2 on 11/1 and 11/2.  Stable on trach collar, management per PCCM.  Continue trach collar at 28%.  Using Passy-Muir valve.  Tracheostomy downsized 09/07/2021.  PCCM to following weekly> also would call as needed. PCCM follow up from today appreciated.  S/p chest tube due to pneumothorax, removed 09/08/2019.  TCTS signed off 11/17.  Patient has intermittent thick viscid tracheostomy secretions, continue aggressive pulmonary toilet, per CCM maintain trach size to 6 cuffless due to secretions and debility.  Plan to downsize the trach, capping trials per CCM  Cough -Patient has been having intractable cough -Concern about if patient has silent aspiration -Chest x-ray shows no acute abnormalities -Patient is spiking low-grade fever of 99.9 -Started the patient on duo nebs -Added budesonide nebs if patient has some bronchitis -Keep the patient on doxycycline -Continues to have good improvement  ESRD on hemodialysis  Started off as acute kidney injury complicating stage III CKD.  Has now progressed to ESRD.  Nephrology continues to assist.  ESRD presumed  as he has been HD dependent for over a month without signs of renal recovery.  Ongoing TTS schedule, s/p HD  11/21.  Next HD 11/23, Thanksgiving holiday schedule.  Currently dialyzed through TTS, vascular surgery recommend staged procedure for aVF and would like to wait further until patient gets stronger from his debility.  He is not appropriate for SNF because unable to sit for outpatient HD.  Only options would be LTAC or CIR.  Septic shock, resolved  Right pneumothorax/empyema/necrotic right lower lobe/bronchopleural fistula with large air leak  Chest tube removed by CT surgery on 11/16.  Periodic chest x-ray follow-up.  Appears stable for now.  Chest x-ray 11/17: Unchanged small right basal lateral pneumothorax.  Acute on chronic anemia  Secondary to gastric ulcer, s/p EGD with culprit vessel clipping.  Appears that he has received 2 units PRBC transfusion thus far.  Last last PRBC transfusion on 11/17.  Hemoglobin 8.6.  Follow CBCs closely across dialysis.  No overt GI bleeding.  Continue IV ferric gluconate and Aranesp.  H. pylori positive by EGD biopsy   GI recommends waiting until clinically stable after discharge and starting 2 weeks regimen to prevent ulcer recurrence. GI (per Dr. Cristina Gong 10/27) to follow up in 2-3 months from hospitalization to arrange this and possible f/u EGD.   CVA  Small bilateral cerebral/right cerebellum infarcts 11/3 likely due to hypotension during dialysis, sepsis and deconditioning.  PAF is also potential etiology.  Mental status continues to gradually improve.  Has residual left hemiplegia, dysphagia.  Neurology recommends aspirin 325 mg daily, permissive hypertension.  No anticoagulation given history of GI bleed.  Follow-up in 4 weeks after discharge at stroke clinic NP/GNA.  Continue therapies evaluation and will need to determine disposition i.e. CIR versus LTAC.   Dysphagia  Has right nasal core track.    Speech therapy follow-up appreciated.  Have upgraded to dysphagia 2 diet and nectar thickened liquids.  Discussed with dietitian  on 11/17.  Reportedly not eating adequately yet.  She would like to repeat calorie count again, resume tube feeds at night and reassess in 48 hours.  If ongoing poor oral intake, will need to discuss with family regarding PEG tube.  Anoxic brain injury  As per neurology MRI brain 11/3 consistent with anoxic brain injury.  Seems to be coherent and responds to questions appropriately.  Atrial fibrillation, paroxysmal  Currently in sinus rhythm.  Briefly required amiodarone earlier in the hospitalization.  Continue aspirin.  Not anticoagulation candidate due to GI bleed.  Discontinued telemetry 11/22.  MSSA bacteremia  TTE negative but still high concern for infective endocarditis and hence continue cefazolin until end date of 11/23 i.e. total of 6 weeks.  Pseudomonas and Acinetobacter in BAL  Resolved.  Completed 10 days course of meropenem.  Elevated D-dimer  No full anticoagulation with GI bleed.  Dopplers negative for DVT on 10/8.  Likely related to acute illness/acute infectious etiology.  Pressure Injury 08/24/19 Sacrum Right;Left;Mid Stage II -  Partial thickness loss of dermis presenting as a shallow open ulcer with a red, pink wound bed without slough.  (Active)  08/24/19 0800  Location: Sacrum  Location Orientation: Right;Left;Mid  Staging: Stage II -  Partial thickness loss of dermis presenting as a shallow open ulcer with a red, pink wound bed without slough.  Wound Description (Comments): -- (stage 11 mid sacrum)  Present on Admission: No    Goals of care Guarded prognosis.  Family wish to pursue full scope of treatment.  Palliative medicine assisted with GOC.  Diarrhea Unclear etiology.  Resolved.  Flexi-Seal removed over the weekend.  Severe debility  Patient seen this morning with rehab team at bedside.  They are evaluating him and communicating with family to see if he is appropriate for CIR.  Adult failure to thrive   Body mass index is 27.46 kg/m.   Nutritional Status Nutrition Problem: Increased nutrient needs Etiology: chronic illness Signs/Symptoms: estimated needs Interventions: Magic cup, MVI, Tube feeding, Calorie Count, Refer to RD note for recommendations  DVT prophylaxis: SCDs Code Status: DNR Family Communication: Patient's wife at bedside, patient Disposition: To be determined.?  CIR versus LTAC.   Consultants:  ID, nephrology, GI, neurology PCCM, Palliative, rehab  Procedures:  Chest tube removed, 11/16 Tunneled HD cath, 11/10 HD, 11/3 Tracheostomy 10/30 EGD 10/17. Gastric ulcer, nonbleeding visible vessel clipped CRRT 10/15 Bronchoscopy with endobronchial ballon, 10/15, repeat 10/16 Right-sided chest tube, 10/14 Right pigtail chest tube--for right PTX 10/13-2/14 Right IJ 10/2-10/10 ETT 10/2  Antimicrobials:  None.   Subjective: Patient states continues to have improvement with cough. Objective:  Vitals:   09/18/19 0438 09/18/19 0815 09/18/19 0938 09/18/19 1035  BP: (!) 141/85  (!) 141/84   Pulse: (!) 105 100 (!) 105 (!) 106  Resp: 16 16 (!) 26 (!) 21  Temp: 98.3 F (36.8 C)  97.9 F (36.6 C)   TempSrc:   Oral   SpO2: 95% 96% 92% 94%  Weight:      Height:        Examination:  General exam: Pleasant middle-age male, moderately built and nourished lying comfortably propped up in bed without distress.  Tracheostomy open to room air.  Minimal thick viscid creamy secretions.Marland Kitchen Respiratory system: Clear to auscultation anteriorly.  Slightly diminished at bases.  No increased work of breathing. Cardiovascular system: S1 & S2 heard, RRR. No JVD, murmurs, rubs, gallops or clicks. No pedal edema.  Has left upper extremity dependent edema, improving.  Trace left lower extremity edema.  Stable. Gastrointestinal system: Abdomen is nondistended, soft and nontender. No organomegaly or masses felt. Normal bowel sounds heard. Central nervous system: Alert and oriented to self.  Responds appropriately to simple  questions or instructions.  Appears to have aphasia and some dysarthria Extremities: Left-sided hemiplegia with grade 0 x 5 power.  Right upper extremity grade 3 x 5 distally, 2 x 5 proximally.  Right lower extremity grade 2 x 5 power. Skin: No rashes, lesions or ulcers Psychiatry: Judgement and insight appear impaired. Mood & affect appropriate.     Data Reviewed: I have personally reviewed following labs and imaging studies   CBC: Recent Labs  Lab 09/13/19 1303 09/14/19 0401 09/18/19 1411  WBC 15.5* 12.2* 12.6*  HGB 7.2* 8.6* 7.3*  HCT 23.2* 27.6* 24.1*  MCV 92.4 93.9 94.5  PLT 266 295 277    Basic Metabolic Panel: Recent Labs  Lab 09/13/19 1303 09/17/19 1115 09/18/19 1411  NA 134* 134* 134*  K 3.5 3.8 4.1  CL 95* 97* 97*  CO2 _0 GLUCOSE 90 128* 105*  BUN 60* 38* 31*  CREATININE 4.37* 4.25* 3.52*  CALCIUM 8.5* 8.6* 8.6*  PHOS 3.1 2.3* 2.2*    Liver Function Tests: Recent Labs  Lab 09/13/19 1303 09/17/19 1115 09/18/19 1411  ALBUMIN 1.7* 1.9* 1.8*    CBG: Recent Labs  Lab 09/17/19 2032 09/18/19 0015 09/18/19 0439 09/18/19 0729 09/18/19 1203  GLUCAP 97 103* 109* 101* 116*    No results found for this  or any previous visit (from the past 240 hour(s)).    Radiology Studies: No results found.        Scheduled Meds: . aspirin  325 mg Per Tube Daily  . atorvastatin  40 mg Oral q1800  . budesonide (PULMICORT) nebulizer solution  0.5 mg Nebulization BID  . chlorhexidine  15 mL Mouth/Throat BID  . Chlorhexidine Gluconate Cloth  6 each Topical Q0600  . Chlorhexidine Gluconate Cloth  6 each Topical Q0600  . [START ON 09/20/2019] darbepoetin (ARANESP) injection - DIALYSIS  100 mcg Intravenous Q Sat-HD  . heparin injection (subcutaneous)  5,000 Units Subcutaneous Q8H  . ipratropium-albuterol  3 mL Nebulization BID  . mouth rinse  15 mL Mouth Rinse 10 times per day  . multivitamin  1 tablet Oral QHS  . pantoprazole sodium  40 mg Per Tube  Daily  . sucralfate  1 g Oral Q6H   Continuous Infusions: . sodium chloride Stopped (09/15/19 2208)  . dextrose Stopped (08/16/19 1623)  . doxycycline (VIBRAMYCIN) IV 100 mg (09/18/19 0429)  . feeding supplement (NEPRO CARB STEADY) 1,000 mL (09/15/19 2127)  . [START ON 09/20/2019] ferric gluconate (FERRLECIT/NULECIT) IV       LOS: 6 days     Edithe Dobbin, MD, FACP, SFHM. [facility]  To contact the attending provider between 7A-7P or the covering provider during after hours 7P-7A, please log into the web site [email or web address] and access using universal [place] password for that web site. If you do not have the password, please call the hospital operator.  09/18/2019, 2:53 PM

## 2019-09-18 NOTE — Progress Notes (Signed)
[facility] Progress Note   Subjective:   Patient seen in room, sleeping. Has ongoing thick yellow secretions. Family at bedside reports a lot of coughing yesterday.   Objective Vitals:   09/18/19 0438 09/18/19 0815 09/18/19 0938 09/18/19 1035  BP: (!) 141/85  (!) 141/84   Pulse: (!) 105 100 (!) 105 (!) 106  Resp: 16 16 (!) 26 (!) 21  Temp: 98.3 F (36.8 C)  97.9 F (36.6 C)   TempSrc:   Oral   SpO2: 95% 96% 92% 94%  Weight:      Height:       Physical Exam General: Chronically ill appearing male, appears comfortable, in NAD.  Heart: RRR, no murmurs, rubs or gallops.  Lungs: Trach in place with thick yellow secretions. Auscultated anteriorly, decreased breath sounds.  Abdomen: Soft, non-distended, +BS Extremities: Trace pedal edema b/l lower extremities Dialysis Access: L IJ TDC without erythema/drainage  Additional Objective Labs: Basic Metabolic Panel: Recent Labs  Lab 09/13/19 1303 09/17/19 1115  NA 134* 134*  K 3.5 3.8  CL 95* 97*  CO2 26 26  GLUCOSE 90 128*  BUN 60* 38*  CREATININE 4.37* 4.25*  CALCIUM 8.5* 8.6*  PHOS 3.1 2.3*   Liver Function Tests: Recent Labs  Lab 09/13/19 1303 09/17/19 1115  ALBUMIN 1.7* 1.9*   CBC: Recent Labs  Lab 09/13/19 1303 09/14/19 0401  WBC 15.5* 12.2*  HGB 7.2* 8.6*  HCT 23.2* 27.6*  MCV 92.4 93.9  PLT 266 295   Blood Culture    Component Value Date/Time   SDES  08/20/2019 1254    TRACHEAL ASPIRATE Performed at [facility], [place] [address] [place], [place] [address]    SPECREQUEST  08/20/2019 1254    NONE Performed at [facility], [place] [address], [place], [place] [address]    CULT RARE PSEUDOMONAS AERUGINOSA 08/20/2019 1254   REPTSTATUS 08/23/2019 FINAL 08/20/2019 1254    CBG: Recent Labs  Lab 09/17/19 2032 09/18/19 0015 09/18/19 0439 09/18/19 0729 09/18/19 1203  GLUCAP 97 103* 109* 101* 116*   Medications: . sodium chloride Stopped  (09/15/19 2208)  . dextrose Stopped (08/16/19 1623)  . doxycycline (VIBRAMYCIN) IV 100 mg (09/18/19 0429)  . feeding supplement (NEPRO CARB STEADY) 1,000 mL (09/15/19 2127)  . [START ON 09/20/2019] ferric gluconate (FERRLECIT/NULECIT) IV     . aspirin  325 mg Per Tube Daily  . atorvastatin  40 mg Oral q1800  . budesonide (PULMICORT) nebulizer solution  0.5 mg Nebulization BID  . chlorhexidine  15 mL Mouth/Throat BID  . Chlorhexidine Gluconate Cloth  6 each Topical Q0600  . Chlorhexidine Gluconate Cloth  6 each Topical Q0600  . [START ON 09/20/2019] darbepoetin (ARANESP) injection - DIALYSIS  100 mcg Intravenous Q Sat-HD  . heparin injection (subcutaneous)  5,000 Units Subcutaneous Q8H  . ipratropium-albuterol  3 mL Nebulization Q6H  . mouth rinse  15 mL Mouth Rinse 10 times per day  . multivitamin  1 tablet Oral QHS  . pantoprazole sodium  40 mg Per Tube Daily  . sucralfate  1 g Oral Q6H    Dialysis Orders: New start ESRD  Assessment/Plan: 1. S/p acute hypoxemia and respiratory failure due to COVID 19: Chest tube out, still with trach. + secretions. Downsizing cuff per pulm. Will continue fluid removal with HD. 2. ESRD:  New start this admission, on TTS schedule but holiday schedule this week. Resume TTS schedule on Saturday. No signs of renal recovery at this point.  Will plan for permanent access once patient is more stable.  3. HTN/volume:  BP mildly elevated. Continue to remove volume with HD as tolerated.  4. Anemia: Hgb 8.6 on 11/22- looks like CBC has not been repeated since, will order today. GI bleed this admission. No heparin with HD. On aranesp, receiving iron.  5. Secondary hyperparathyroidism:  Corrected calcium 10.3. Not on calcium or vit D, use low Ca bath. Phos 2.3, not on binder, will trend and replete if phos is still low.  6. CVA: Bilatcerebral cortex and right cerebellum small acute to subacute infarctswithNeuromuscularweakness/encephalopathy. See disposition 6.  Dispo-Cannot do OP HD with trach and will need to tolerate travel/4 hours in chair. Very debilitated- cannot turn in bed, cant sit up for OP HD - being told that LTAC is not appropriate due to trach but maybe due to this issue of debility- not able to do OP HD or get AVF at this time.   Anice Paganini, PA-C 09/18/2019, 1:08 PM  [facility] Pager: [phone]

## 2019-09-18 NOTE — Progress Notes (Signed)
Patient's NG tube through which is he receiving continuous feeding supplement - Nephro is clogged. Attempt to flush tube with sterile water failed. [place],NP notified via Amion. Orders received in Epic. Will continue to monitor.   Brix Brearley,RN.

## 2019-09-19 DIAGNOSIS — N170 Acute kidney failure with tubular necrosis: Secondary | ICD-10-CM | POA: Diagnosis not present

## 2019-09-19 DIAGNOSIS — U071 COVID-19: Secondary | ICD-10-CM | POA: Diagnosis not present

## 2019-09-19 DIAGNOSIS — J1289 Other viral pneumonia: Secondary | ICD-10-CM | POA: Diagnosis not present

## 2019-09-19 DIAGNOSIS — D62 Acute posthemorrhagic anemia: Secondary | ICD-10-CM | POA: Diagnosis not present

## 2019-09-19 LAB — CBC WITH DIFFERENTIAL/PLATELET
Abs Immature Granulocytes: 0.08 10*3/uL — ABNORMAL HIGH (ref 0.00–0.07)
Basophils Absolute: 0 10*3/uL (ref 0.0–0.1)
Basophils Relative: 0 %
Eosinophils Absolute: 1.2 10*3/uL — ABNORMAL HIGH (ref 0.0–0.5)
Eosinophils Relative: 11 %
HCT: 25.9 % — ABNORMAL LOW (ref 39.0–52.0)
Hemoglobin: 8.1 g/dL — ABNORMAL LOW (ref 13.0–17.0)
Immature Granulocytes: 1 %
Lymphocytes Relative: 10 %
Lymphs Abs: 1.2 10*3/uL (ref 0.7–4.0)
MCH: 29.1 pg (ref 26.0–34.0)
MCHC: 31.3 g/dL (ref 30.0–36.0)
MCV: 93.2 fL (ref 80.0–100.0)
Monocytes Absolute: 1.2 10*3/uL — ABNORMAL HIGH (ref 0.1–1.0)
Monocytes Relative: 10 %
Neutro Abs: 7.8 10*3/uL — ABNORMAL HIGH (ref 1.7–7.7)
Neutrophils Relative %: 68 %
Platelets: 359 10*3/uL (ref 150–400)
RBC: 2.78 MIL/uL — ABNORMAL LOW (ref 4.22–5.81)
RDW: 14.6 % (ref 11.5–15.5)
WBC: 11.5 10*3/uL — ABNORMAL HIGH (ref 4.0–10.5)
nRBC: 0 % (ref 0.0–0.2)

## 2019-09-19 LAB — GLUCOSE, CAPILLARY
Glucose-Capillary: 79 mg/dL (ref 70–99)
Glucose-Capillary: 87 mg/dL (ref 70–99)
Glucose-Capillary: 88 mg/dL (ref 70–99)
Glucose-Capillary: 93 mg/dL (ref 70–99)

## 2019-09-19 LAB — RENAL FUNCTION PANEL
Albumin: 1.9 g/dL — ABNORMAL LOW (ref 3.5–5.0)
Anion gap: 11 (ref 5–15)
BUN: 44 mg/dL — ABNORMAL HIGH (ref 8–23)
CO2: 26 mmol/L (ref 22–32)
Calcium: 8.8 mg/dL — ABNORMAL LOW (ref 8.9–10.3)
Chloride: 95 mmol/L — ABNORMAL LOW (ref 98–111)
Creatinine, Ser: 4.95 mg/dL — ABNORMAL HIGH (ref 0.61–1.24)
GFR calc Af Amer: 13 mL/min — ABNORMAL LOW (ref >60)
GFR calc non Af Amer: 11 mL/min — ABNORMAL LOW (ref >60)
Glucose, Bld: 96 mg/dL (ref 70–99)
Phosphorus: 3 mg/dL (ref 2.5–4.6)
Potassium: 4.4 mmol/L (ref 3.5–5.1)
Sodium: 132 mmol/L — ABNORMAL LOW (ref 135–145)

## 2019-09-19 NOTE — Progress Notes (Signed)
Nutrition Follow-up  DOCUMENTATION CODES:   Not applicable  INTERVENTION:   -Continue Magic cup TID with meals, each supplement provides 290 kcal and 9 grams of protein -Continue renal MVI daily  Resume nocturnal feedings: -Nepro @ 65 ml/hr via cortrak tube x 12 hours (2000-0800)   Tube feeding regimen provides 1404 kcal (64% of needs), 63 grams of protein, and 1134 ml of H2O.   -Recommend continuing nocturnal feedings until pt can consistently demonstrate consuming >75% of estimated intake via PO route; may need to consider permanent feeding access if poor oral intake persists  NUTRITION DIAGNOSIS:   Increased nutrient needs related to chronic illness as evidenced by estimated needs.  Ongoing  GOAL:   Patient will meet greater than or equal to 90% of their needs  Progressing   MONITOR:   PO intake, Supplement acceptance, Diet advancement, Labs, Weight trends, Skin, I & O's  REASON FOR ASSESSMENT:   Consult Enteral/tube feeding initiation and management  ASSESSMENT:   68 y/o male admitted with PNA and COVID 19  11/18 Diet advanced to Dysphagia I, Honey Thick 11/21- TF d/c 11/22- advanced to dysphagia 2 diet with nectar thick liquids 11/27- downsized to size 6 cuffless trach today  Reviewed I/O's: +4.4 L x 24 hours and +6.3 L since 09/05/19  Per PCCM notes, pt may decannulate on Sunday if he tolerates capping.   Spoke with pt and wife at bedside. Pt drowsy at time of visit, after completing good session with physical therapy ('" just want to take a nap now"). Pt wife reports that pt's intake has improved since the beginning of the week, however, is still not eating a lot. Per wife, pt refused breakfast, however, consumed about half of lunch (observed meal tray- pt consumed about 1/3 of Magic Cup, 50% of mashed potatoes, beef, and carrots).   Pt denies any difficulty swallowing current diet texture, however, also complains of limited food options on dysphagia diet  ('I eat it if it tastes good"). Per wife, pt had received [place] for the past several meals and pt is tiring of this. Pt also enjoys orange Magic Cups.   Pt denies any difficult tolerating TF. Discussed with pt and wife rationale for supplemental TF given increased nutritional needs and extremely variable oral intake. Do not recommend pulling Cortrak tube until pt can meet 75% of needs PO given increased needs for HD and wounds.  Medications reviewed and include dextrose 5% solution @ 50 ml/hr.   Labs reviewed: Na: 134, Phos: 2.2, CBGS: 84-96.   NUTRITION - FOCUSED PHYSICAL EXAM:    Most Recent Value  Orbital Region  No depletion  Upper Arm Region  Mild depletion  Thoracic and Lumbar Region  No depletion  Buccal Region  No depletion  Temple Region  No depletion  Clavicle Bone Region  No depletion  Clavicle and Acromion Bone Region  No depletion  Scapular Bone Region  No depletion  Dorsal Hand  Mild depletion  Patellar Region  Mild depletion  Anterior Thigh Region  Mild depletion  Posterior Calf Region  Mild depletion  Edema (RD Assessment)  Mild  Hair  Reviewed  Eyes  Reviewed  Mouth  Reviewed  Skin  Reviewed  Nails  Reviewed       Diet Order:   Diet Order            DIET DYS 2 Room service appropriate? Yes; Fluid consistency: Nectar Thick  Diet effective now  EDUCATION NEEDS:   No education needs have been identified at this time  Skin:  Skin Assessment: Skin Integrity Issues: Skin Integrity Issues:: Other (Comment), Incisions, Stage II DTI: - Stage I: - Stage II: perineum, sacrum Incisions: rt lateral lower chest Other: MASD buttocks, penis, open penis sore, lt neck, lt chest  Last BM:  11/23  Height:   Ht Readings from Last 1 Encounters:  09/07/19 5\' 10"  (1.778 m)    Weight:   Wt Readings from Last 1 Encounters:  09/18/19 86.3 kg    Ideal Body Weight:  75.45 kg  BMI:  Body mass index is 27.3 kg/m.  Estimated Nutritional Needs:    Kcal:  2200-2400  Protein:  130-150 gm  Fluid:  1000 mL plus UOP    [name] A. Jimmye Norman, RD, LDN, [place] Registered Dietitian II Certified Diabetes Care and Education Specialist Pager: [phone] After hours Pager: [phone]

## 2019-09-19 NOTE — Progress Notes (Signed)
[facility] Progress Note   Subjective:   Patient seen in room. Awake and alert. Reports ongoing cough. Able to move left hand and foot a little bit today. Denies SOB, CP, palpitations, dizziness, abdominal pain, N/V/D.   Objective Vitals:   09/19/19 0612 09/19/19 0859 09/19/19 0904 09/19/19 1100  BP: 138/84 (!) 168/94    Pulse: 99 98 90 92  Resp: 19 18 18 18   Temp: 99 F (37.2 C) 98.6 F (37 C)    TempSrc: Oral Oral    SpO2: 95% 97% 98% 97%  Weight:      Height:       Physical Exam General: Chronically ill appearing male, appears comfortable, in NAD.  Heart: RRR, no murmurs, rubs or gallops.  Lungs: Trach in place. Auscultated anteriorly, decreased breath sounds but no wheezing, rhonchi or rales auscultated Abdomen: Soft, non-distended, +BS Extremities: Trace pedal edema b/l lower extremities Dialysis Access: L IJ TDC without erythema/drainage  Additional Objective Labs: Basic Metabolic Panel: Recent Labs  Lab 09/13/19 1303 09/17/19 1115 09/18/19 1411  NA 134* 134* 134*  K 3.5 3.8 4.1  CL 95* 97* 97*  CO2 26 26 27   GLUCOSE 90 128* 105*  BUN 60* 38* 31*  CREATININE 4.37* 4.25* 3.52*  CALCIUM 8.5* 8.6* 8.6*  PHOS 3.1 2.3* 2.2*   Liver Function Tests: Recent Labs  Lab 09/13/19 1303 09/17/19 1115 09/18/19 1411  ALBUMIN 1.7* 1.9* 1.8*   No results for input(s): LIPASE, AMYLASE in the last 168 hours. CBC: Recent Labs  Lab 09/13/19 1303 09/14/19 0401 09/18/19 1411  WBC 15.5* 12.2* 12.6*  HGB 7.2* 8.6* 7.3*  HCT 23.2* 27.6* 24.1*  MCV 92.4 93.9 94.5  PLT 266 295 311   Blood Culture    Component Value Date/Time   SDES  08/20/2019 1254    TRACHEAL ASPIRATE Performed at [facility], [place] [address] [place], [place] [address]    SPECREQUEST  08/20/2019 1254    NONE Performed at [facility], [place] [address] [place], [place] [address]    CULT RARE PSEUDOMONAS AERUGINOSA 08/20/2019 1254   REPTSTATUS 08/23/2019 FINAL 08/20/2019 1254    Cardiac Enzymes: No results for input(s): CKTOTAL, CKMB, CKMBINDEX, TROPONINI in the last 168 hours. CBG: Recent Labs  Lab 09/18/19 1203 09/18/19 1545 09/18/19 2021 09/18/19 2348 09/19/19 1147  GLUCAP 116* 96 93 84 79   Iron Studies: No results for input(s): IRON, TIBC, TRANSFERRIN, FERRITIN in the last 72 hours. @lablastinr3 @ Studies/Results: No results found. Medications: . sodium chloride Stopped (09/15/19 2208)  . dextrose Stopped (08/16/19 1623)  . dextrose 50 mL/hr at 09/19/19 0015  . doxycycline (VIBRAMYCIN) IV 100 mg (09/19/19 0534)  . feeding supplement (NEPRO CARB STEADY) Stopped (09/19/19 0148)  . [START ON 09/20/2019] ferric gluconate (FERRLECIT/NULECIT) IV     . aspirin  325 mg Per Tube Daily  . atorvastatin  40 mg Oral q1800  . budesonide (PULMICORT) nebulizer solution  0.5 mg Nebulization BID  . chlorhexidine  15 mL Mouth/Throat BID  . Chlorhexidine Gluconate Cloth  6 each Topical Q0600  . Chlorhexidine Gluconate Cloth  6 each Topical Q0600  . [START ON 09/20/2019] darbepoetin (ARANESP) injection - DIALYSIS  100 mcg Intravenous Q Sat-HD  . heparin injection (subcutaneous)  5,000 Units Subcutaneous Q8H  . ipratropium-albuterol  3 mL Nebulization BID  . mouth rinse  15 mL Mouth Rinse 10 times per day  . multivitamin  1 tablet Oral QHS  . pantoprazole sodium  40  mg Per Tube Daily  . sucralfate  1 g Oral Q6H    Dialysis Orders: New start ESRD  Assessment/Plan: 1. S/p acute hypoxemia and respiratory failure due to COVID 19: Chest tube out, still with trach. Downsizing cuff per pulm. Will continue fluid removal with HD. 2. ESRD:  New start this admission, on TTS schedule but holiday schedule this week. Resume TTS schedule on Saturday. No signs of renal recovery at this point. Will plan for permanent access once patient is more stable.  3. HTN/volume:  BP mildly elevated. Continue to remove volume with HD as  tolerated.  4. Anemia: Hgb 8.6 on 11/22 > 7.3 on 11/26. Will repeat today. GI bleed this admission. No heparin with HD. On aranesp, next dose tomorrow. receiving iron.  5. Secondary hyperparathyroidism:  Corrected calcium 10.3. Not on calcium or vit D, use low Ca bath. Phos 2.2, not on binder, will trend and replete as needed.  6. CVA: Bilatcerebral cortex and right cerebellum small acute to subacute infarctswithNeuromuscularweakness/encephalopathy. See disposition 6. Dispo-Cannot do OP HD with trach and will need to tolerate travel/4 hours in chair. Very debilitated- cannot turn in bed, cant sit up for OP HD - being told that LTAC is not appropriate due to trach but maybe due to this issue of debility- not able to do OP HD or get AVF at this time.   Anice Paganini, PA-C 09/19/2019, 2:50 PM  [employer] Pager: [phone]

## 2019-09-19 NOTE — Progress Notes (Signed)
PROGRESS NOTE   [name]  LOV:[id number]    DOB: 11/11/1950    DOA: 07/25/2019  PCP: Kathyrn Drown, MD   I have briefly reviewed patients previous medical records in [facility].  Chief Complaint  Patient presents with  . Shortness of Breath    Brief Narrative:  68 year old male with PMH of GERD and BPH, initially presented on 07/25/2019 with worsening dyspnea after recently testing positive for COVID-19 on 07/22/2019.  He required emergent intubation at [facility] ED.  He was admitted to the [facility] hospital for acute hypoxic respiratory failure secondary to severe ARDS from Covid pneumonia and completed Decadron, Remdesivir and Tocilizumab.  Hospital course complicated by acute kidney injury requiring CRRT, encephalopathy, pneumothorax complicated by persistent air leak and bronchopleural fistula requiring endobronchial blocker and pleur-evac, prolonged mechanical ventilation s/p tracheostomy on 10/30, MSSA bacteremia with negative echocardiogram intermittent pressor support with dialysis, melena with acute blood loss anemia requiring transfusion, IV PPI, empiric octreotide, EGD showed gastric ulcer and nonbleeding visible vessel clipped, transferred to [facility] on 08/26/2023 intermittent HD, subacute right cerebral/cerebellar infarcts on MRI.  PCCM following for tracheostomy management, weekly.  Nephrology on board for HD needs.  VVS plans permanent dialysis access after patient improves overall.  Assessment & Plan:   Principal Problem:   Pneumonia due to COVID-19 virus Active Problems:   BPH (benign prostatic hyperplasia)   Acute respiratory failure due to COVID-19 (HCC)   AKI (acute kidney injury) (HCC)   Fluid overload   Pneumothorax on right   Acute renal failure (ARF) (HCC)   GI bleed   Atrial fibrillation with RVR (HCC)   Constipation   Gastric ulcer with hemorrhage   Acute respiratory failure (HCC)   Chest tube in place   Primary spontaneous  pneumothorax   Acute respiratory distress syndrome (ARDS) due to COVID-19 virus ([place])   Tracheostomy status ([place])   Cerebral thrombosis with cerebral infarction   Advanced care planning/counseling discussion   Goals of care, counseling/discussion   Palliative care by specialist   Sinus tachycardia   Acute blood loss anemia   H. pylori infection   CVA (cerebral vascular accident) ([place])   Anoxic brain injury ([place])   MSSA bacteremia   Positive D dimer   Pseudomonas infection   Infection due to acinetobacter baumannii   Acute hypoxic respiratory failure secondary to severe ARDS from Covid pneumonia, s/p tracheostomy  Completed remdesivir/Decadron/Actemra/convalescent plasma therapy.  ARDS resolved.  Covid testing negative x2 on 11/1 and 11/2.  Stable on trach collar, management per PCCM.  Continue trach collar at 28%.  Using Passy-Muir valve.  Tracheostomy downsized 09/07/2021.  PCCM to following weekly> also would call as needed. PCCM follow up from today appreciated.  S/p chest tube due to pneumothorax, removed 09/08/2019.  TCTS signed off 11/17.  Patient has intermittent thick viscid tracheostomy secretions, continue aggressive pulmonary toilet, per CCM maintain trach size to 6 cuffless due to secretions and debility.  Plan to downsize the trach, capping trials per CCM  Cough -Patient has been having intractable cough -Concern about if patient has silent aspiration -Chest x-ray shows no acute abnormalities -Patient is spiking low-grade fever of 99.9 -Started the patient on duo nebs -Added budesonide nebs if patient has some bronchitis -Keep the patient on doxycycline -Continues to have good improvement  ESRD on hemodialysis  Started off as acute kidney injury complicating stage III CKD.  Has now progressed to ESRD.  Nephrology continues to assist.  ESRD presumed  as he has been HD dependent for over a month without signs of renal recovery.  Ongoing TTS schedule, s/p HD  11/21.  Next HD 11/23, Thanksgiving holiday schedule.  Currently dialyzed through TTS, vascular surgery recommend staged procedure for aVF and would like to wait further until patient gets stronger from his debility.  He is not appropriate for SNF because unable to sit for outpatient HD.  Only options would be LTAC or CIR.  Septic shock, resolved  Right pneumothorax/empyema/necrotic right lower lobe/bronchopleural fistula with large air leak  Chest tube removed by CT surgery on 11/16.  Periodic chest x-ray follow-up.  Appears stable for now.  Chest x-ray 11/17: Unchanged small right basal lateral pneumothorax.  Acute on chronic anemia  Secondary to gastric ulcer, s/p EGD with culprit vessel clipping.  Appears that he has received 2 units PRBC transfusion thus far.  Last last PRBC transfusion on 11/17.  Hemoglobin 8.6.  Follow CBCs closely across dialysis.  No overt GI bleeding.  Continue IV ferric gluconate and Aranesp.  H. pylori positive by EGD biopsy   GI recommends waiting until clinically stable after discharge and starting 2 weeks regimen to prevent ulcer recurrence. GI (per Dr. Cristina Gong 10/27) to follow up in 2-3 months from hospitalization to arrange this and possible f/u EGD.   CVA  Small bilateral cerebral/right cerebellum infarcts 11/3 likely due to hypotension during dialysis, sepsis and deconditioning.  PAF is also potential etiology.  Mental status continues to gradually improve.  Has residual left hemiplegia, dysphagia.  Neurology recommends aspirin 325 mg daily, permissive hypertension.  No anticoagulation given history of GI bleed.  Follow-up in 4 weeks after discharge at stroke clinic NP/[facility].  Continue therapies evaluation and will need to determine disposition i.e. CIR versus LTAC.   Dysphagia  Has right nasal core track.    Speech therapy follow-up appreciated.  Have upgraded to dysphagia 2 diet and nectar thickened liquids.  Discussed with dietitian  on 11/17.  Reportedly not eating adequately yet.  She would like to repeat calorie count again, resume tube feeds at night and reassess in 48 hours.  If ongoing poor oral intake, will need to discuss with family regarding PEG tube.  Anoxic brain injury  As per neurology MRI brain 11/3 consistent with anoxic brain injury.  Seems to be coherent and responds to questions appropriately.  Atrial fibrillation, paroxysmal  Currently in sinus rhythm.  Briefly required amiodarone earlier in the hospitalization.  Continue aspirin.  Not anticoagulation candidate due to GI bleed.  Discontinued telemetry 11/22.  MSSA bacteremia  TTE negative but still high concern for infective endocarditis and hence continue cefazolin until end date of 11/23 i.e. total of 6 weeks.  Pseudomonas and Acinetobacter in BAL  Resolved.  Completed 10 days course of meropenem.  Elevated D-dimer  No full anticoagulation with GI bleed.  Dopplers negative for DVT on 10/8.  Likely related to acute illness/acute infectious etiology.  Pressure Injury 08/24/19 Sacrum Right;Left;Mid Stage II -  Partial thickness loss of dermis presenting as a shallow open ulcer with a red, pink wound bed without slough.  (Active)  08/24/19 0800  Location: Sacrum  Location Orientation: Right;Left;Mid  Staging: Stage II -  Partial thickness loss of dermis presenting as a shallow open ulcer with a red, pink wound bed without slough.  Wound Description (Comments): -- (stage 11 mid sacrum)  Present on Admission: No    Goals of care Guarded prognosis.  Family wish to pursue full scope of treatment.  Palliative medicine assisted with GOC.  Diarrhea Unclear etiology.  Resolved.  Flexi-Seal removed over the weekend.  Severe debility  Patient seen this morning with rehab team at bedside.  They are evaluating him and communicating with family to see if he is appropriate for CIR.  Adult failure to thrive   Body mass index is 27.3 kg/m.   Nutritional Status Nutrition Problem: Increased nutrient needs Etiology: chronic illness Signs/Symptoms: estimated needs Interventions: Magic cup, MVI, Tube feeding, Calorie Count, Refer to RD note for recommendations  DVT prophylaxis: SCDs Code Status: DNR Family Communication: Patient's wife at bedside, patient Disposition: To be determined.?  CIR versus LTAC.   Consultants:  ID, nephrology, GI, neurology PCCM, Palliative, rehab  Procedures:  Chest tube removed, 11/16 Tunneled HD cath, 11/10 HD, 11/3 Tracheostomy 10/30 EGD 10/17. Gastric ulcer, nonbleeding visible vessel clipped CRRT 10/15 Bronchoscopy with endobronchial ballon, 10/15, repeat 10/16 Right-sided chest tube, 10/14 Right pigtail chest tube--for right PTX 10/13-2/14 Right IJ 10/2-10/10 ETT 10/2  Antimicrobials:  None.   Subjective:  patient states still has some cough.. Objective:  Vitals:   09/19/19 1100 09/19/19 1505 09/19/19 1646 09/19/19 2009  BP:   (!) 156/85 137/83  Pulse: 92 (!) 105 (!) 105 99  Resp: _0 Temp:   99.3 F (37.4 C) 98.2 F (36.8 C)  TempSrc:   Oral   SpO2: 97% 96% 100% 95%  Weight:      Height:        Examination:  General exam: Pleasant middle-age male, moderately built and nourished lying comfortably propped up in bed without distress.  Tracheostomy open to room air.  Minimal thick viscid creamy secretions.Marland Kitchen Respiratory system: Clear to auscultation anteriorly.  Slightly diminished at bases.  No increased work of breathing. Cardiovascular system: S1 & S2 heard, RRR. No JVD, murmurs, rubs, gallops or clicks. No pedal edema.  Has left upper extremity dependent edema, improving.  Trace left lower extremity edema.  Stable. Gastrointestinal system: Abdomen is nondistended, soft and nontender. No organomegaly or masses felt. Normal bowel sounds heard. Central nervous system: Alert and oriented to self.  Responds appropriately to simple questions or instructions.  Appears  to have aphasia and some dysarthria Extremities: Left-sided hemiplegia with grade 0 x 5 power.  Right upper extremity grade 3 x 5 distally, 2 x 5 proximally.  Right lower extremity grade 2 x 5 power. Skin: No rashes, lesions or ulcers Psychiatry: Judgement and insight appear impaired. Mood & affect appropriate.     Data Reviewed: I have personally reviewed following labs and imaging studies   CBC: Recent Labs  Lab 09/13/19 1303 09/14/19 0401 09/18/19 1411 09/19/19 1548  WBC 15.5* 12.2* 12.6* 11.5*  NEUTROABS  --   --   --  7.8*  HGB 7.2* 8.6* 7.3* 8.1*  HCT 23.2* 27.6* 24.1* 25.9*  MCV 92.4 93.9 94.5 93.2  PLT 266 295 311 100    Basic Metabolic Panel: Recent Labs  Lab 09/13/19 1303 09/17/19 1115 09/18/19 1411 09/19/19 1548  NA 134* 134* 134* 132*  K 3.5 3.8 4.1 4.4  CL 95* 97* 97* 95*  CO2 _1 GLUCOSE 90 128* 105* 96  BUN 60* 38* 31* 44*  CREATININE 4.37* 4.25* 3.52* 4.95*  CALCIUM 8.5* 8.6* 8.6* 8.8*  PHOS 3.1 2.3* 2.2* 3.0    Liver Function Tests: Recent Labs  Lab 09/13/19 1303 09/17/19 1115 09/18/19 1411 09/19/19 1548  ALBUMIN 1.7* 1.9* 1.8* 1.9*    CBG: Recent  Labs  Lab 09/18/19 2021 09/18/19 2348 09/19/19 1147 09/19/19 1634 09/19/19 2008  GLUCAP 93 84 79 88 87    No results found for this or any previous visit (from the past 240 hour(s)).    Radiology Studies: No results found.        Scheduled Meds: . aspirin  325 mg Per Tube Daily  . atorvastatin  40 mg Oral q1800  . budesonide (PULMICORT) nebulizer solution  0.5 mg Nebulization BID  . chlorhexidine  15 mL Mouth/Throat BID  . Chlorhexidine Gluconate Cloth  6 each Topical Q0600  . Chlorhexidine Gluconate Cloth  6 each Topical Q0600  . [START ON 09/20/2019] darbepoetin (ARANESP) injection - DIALYSIS  100 mcg Intravenous Q Sat-HD  . heparin injection (subcutaneous)  5,000 Units Subcutaneous Q8H  . ipratropium-albuterol  3 mL Nebulization BID  . mouth rinse  15 mL  Mouth Rinse 10 times per day  . multivitamin  1 tablet Oral QHS  . pantoprazole sodium  40 mg Per Tube Daily  . sucralfate  1 g Oral Q6H   Continuous Infusions: . sodium chloride Stopped (09/15/19 2208)  . dextrose Stopped (08/16/19 1623)  . dextrose 50 mL/hr at 09/19/19 0015  . doxycycline (VIBRAMYCIN) IV 100 mg (09/19/19 1625)  . feeding supplement (NEPRO CARB STEADY) Stopped (09/19/19 0148)  . [START ON 09/20/2019] ferric gluconate (FERRLECIT/NULECIT) IV       LOS: 9 days     Kaesha Kirsch, MD, FACP, SFHM. [facility]  To contact the attending provider between 7A-7P or the covering provider during after hours 7P-7A, please log into the web site [email or web address] and access using universal [place] password for that web site. If you do not have the password, please call the hospital operator.  09/19/2019, 8:57 PM   JM a

## 2019-09-19 NOTE — Progress Notes (Signed)
Patient trach was downsized from a 8 shiley cuffless to a 6 shiley cuffless per order. Patient tolerated well. CO2 detector had positive color changed. Vitals stable throughout. No complications noted. RT will continue to monitor. Extra trach at bedside. Obturator head of bed.

## 2019-09-19 NOTE — Progress Notes (Signed)
NAME:  [name], MRN:  [id number], DOB:  11/03/1950, LOS: 44 ADMISSION DATE:  07/25/2019, CONSULTATION DATE:  10/3 REFERRING MD:  Nadara Mustard, CHIEF COMPLAINT:  Dyspnea   Brief History   68 y/o male diagnosed with COVID on 9/29 presented to the [facility] ED on 10/2 with dyspnea, hypoxemia requiring intubation.  Admitted to [facility], developed AKI requiring CRRT, pneumothorax requiring chest tube and had a large bronchopleural fistula treated for 10 days with a bronchial blocker. Tracheostomy placed on 10/30. To [facility] 11/1 for IHD. Chest tube to suction, improvement in air leak. Waterseal 11/13  Past Medical History  GERD  Significant Hospital Events   10/03 Admit to [facility] from [facility] ER, start decadron and remdesivir, given tociluzimab; prone positioning 10/04 convalescent plasma 10/05 stop prone positioning 10/06 convalescent plasma; prone positioning again 10/09 start ABx 10/10 MSSA bacteremia, CVL d/ced; ID consulted; increase OG tube outpt 10/11 vent weaning trial started 10/13 fever, pneumothorax, pig tail chest tube placed 10/14 worsening hypotension and renal fx >> consulted nephrology; worsening PTX >> replaced chest tube; GNR in sputum >> ABx changed 10/15 start CRRT; endobronchial blocker placed for persistent air leak 10/16 endobronchial blocker repositioned, changed chest tube to water seal; melana with ABLA >> GI consulted; transfuse PRBC 10/17 persistent air leak, increased WOB; start nimbex gtt >> air leak decreased; EGD; A fib with RVR >> start amiodarone 10/18 transfuse PRBC; GI s/o 10/20 resume heparin gtt 10/21 stopped nimbex, chest tube to suction, stopped heparin drip because Hgb dropped again 10/22 TPA in chest tube, repositioned endobronchial blocker 10/23 deflated endobronchial balloon 10/24 small air leak  10/25 removed bronchial blocker 10/30 tracheostomy  11/1 D/C CRRT 11/2 Transfer to [facility] 11/3 Started on IHD 11/7 trach sutures removed 11/4 present, weaning on ATC  11/11 required phenylephrine to tolerate iHD 11/13 chest tube to waterseal 11/16 Chest tube removed  11/27 Downsize trach to 6 cuffless   Consults:  ID Nephrology Gastroenterology PCCM  Procedures:  10/2 R IJ CVL >> 10/10 10/13 Rt pig tail chest tube >> 10/14 10/14 R 59F chest tube >>11/16 10/14 L IJ HD cath >> 11/10 10/30 tracheostomy >>  11/10 tunneled HD cath >>  Significant Diagnostic Tests:  10/2 CT angiogram chest >> extensive bilateral airspace disease predominantly posterior 10/8 doppler legs b/l >> no DVT 10/15 renal u/s >> normal 10/17 EGD >> non bleeding gastric ulcer 10/19 Echo >> EF 60 to 65%, hyperdynamic LV with small LVOT gradient 10/22 CT chest >> large collection of air in pleural space with fluid, pneumonia bilaterally R>L endobronchial blocker in place, chest tube anterior 10/31 CXR >> persistent small right pneumothorax despite chest in adequate position MRI brain 11/3 >> small acute to subacute infarcts involving bilateral cerebral cortex and right cerebellum.  Large b/l mastoid effusions  Micro Data:  9/20 SARS COV 2 >> POSITIVE 10/2 blood >> negative 10/9 blood >> MSSA 2/4 10/9 resp >> MSSA, Pseudomonas 10/13 blood >> negative 10/21 bronch wash bacterial >> pseduomonas, acinetobacter baumannii 10/21 bronch wash fungal >  10/21 bronch wash aspergillus antigen> negative 10/24 bronchoscopy wash> pseudomonas 10/28 Blood cx>>> negative  10/28 Sputum cx>>> Pseudomonas 11/1 SARS COV 2 > negative 11/2 SARS CoV2 >> negative   Antimicrobials:  10/3 remdesivir > 10/6 10/3 actemra 10/3 decadron > 10/12 10/4 convalescent plasma 10/6 convalescent plasma  10/9 vancomycin > 10/10 10/9 cefepime > 10/10 10/10 ancef > 10/13 10/13 cefepime >10/20 10/14 anidulofungin > 10/15  10/20 ancef > 10/23 10/23 meropenem > >>>off  10/21 anidulofungin > 10/22 10/22 voriconazole > 10/23 10/28 Merrem >11/2 10/28 Vanc >10/30  10/31 Ancef > Tentative stop date  11/23   Interim history/subjective:  RN reports no acute events overnight. Seen lying in bed in no acute distress. Tolerating AT well.   Objective   Blood pressure (!) 168/94, pulse 90, temperature 98.6 F (37 C), temperature source Oral, resp. rate 18, height 5\' 10"  (1.778 m), weight 86.3 kg, SpO2 98 %.    FiO2 (%):  [28 %] 28 %   Intake/Output Summary (Last 24 hours) at 09/19/2019 0910 Last data filed at 09/19/2019 0534 Gross per 24 hour  Intake 2331.44 ml  Output 25 ml  Net 2306.44 ml   Filed Weights   09/17/19 1658 09/17/19 2033 09/18/19 2136  Weight: 86.8 kg 86.8 kg 86.3 kg   Examination: General: Chronically ill appearing elderly male/male, in NAD HEENT: [place]/AT, trach midline with secretions present, MM pink/moist, PERRL,  Neuro: Alert and interactive but very deconditioned and weak, follow commands,  CV: s1s2 regular rate and rhythm, no murmur, rubs, or gallops,  PULM:  Course breath sounds throughout, no increased work of breathing,  GI: soft, bowel sounds active in all 4 quadrants, non-tender, non-distended, tolerating TF Extremities: warm/dry, no edema  Skin: no rashes or lesions   Assessment & Plan:   Acute respiratory failure with hypoxemia - 2/2 COVID-19 (s/p remdsevir, actemra, convalescent plasma, steroids)  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  Now with persistent airleak / BPF despite endobronchial blocker placement (removed 10/25 due to MSSA bacteremia) - off MV since 11/2 -Current Trach is 8 mm uncuffed P: Continue ATC, Sats 98 on 28% Titrate FiO2 for sats of 88-92% If able to wean oxygen off please place on humidified air please  Downsize trach to 6 cuffless, if tolerates well consider further downsize 11/28 Trach care per protocol  No capping trials at this time  Eventually plan for downsize trach to 4 then begin capping trials once secretions lessen  PCCM will continue to follow    Signature:    [name], NP-C [facility] After hours pager: [phone]. 09/19/2019, 9:56 AM

## 2019-09-19 NOTE — Plan of Care (Signed)
  Problem: Education: Goal: Knowledge of risk factors and measures for prevention of condition will improve Outcome: Progressing   

## 2019-09-19 NOTE — Progress Notes (Signed)
Physical Therapy Treatment Patient Details Name: [name] MRN: [id number] DOB: 1951/08/11 Today's Date: 09/19/2019    History of Present Illness 68 y/o male diagnosed with COVID on 9/29 presented to the [facility] ED on 10/2 with dyspnea, hypoxemia requiring intubation.  Admitted to [facility], developed AKI requiring CRRT, pneumothorax requiring chest tube and had a large bronchopleural fistula treated for 10 days with a bronchial blocker.  Tracheostomy placed on 10/30. Tested COVID neg x2 on 11/2. MRI on 11/4 showed There is cortical restricted diffusion posteriorly in the right frontal lobe consistent with acute infarct. Additional smaller regions of diffusion abnormality are present in the posterior left frontal lobe, anterior left frontal lobe, and right greater than left medial parietal lobes as well as right cerebellum.     PT Comments    Pt was seen for mobility of ROM to L side UE and BLE's after being positioned up in bed for better O2 exchange and comfort.  Pt has UE's and legs on pillows as needed for edema and comfort, talked with pt and wife about his AAROM to L UE and LE, as well as eliciting some DF on R ankle.  Pt is going to be in CIR hopefully soon, and will focus on movement and strengthening until then. Good response to visit.  Follow Up Recommendations  CIR     Equipment Recommendations  Wheelchair (measurements PT);Wheelchair cushion (measurements PT);Hospital bed;Other (comment)    Recommendations for Other Services Rehab consult     Precautions / Restrictions Precautions Precautions: Fall Precaution Comments: skin breakdown on joints, heels Restrictions Weight Bearing Restrictions: No Other Position/Activity Restrictions: elevate LUE when not exercising    Mobility  Bed Mobility Overal bed mobility: Needs Assistance Bed Mobility: (scooting up)           General bed mobility comments: scooting up is 2 max with repositioning of pillows  Transfers                     Ambulation/Gait                 Stairs             Wheelchair Mobility    Modified Rankin (Stroke Patients Only)       Balance                                            Cognition Arousal/Alertness: Awake/alert Behavior During Therapy: Flat affect Overall Cognitive Status: Impaired/Different from baseline Area of Impairment: Problem solving;Awareness;Safety/judgement;Following commands                   Current Attention Level: Sustained   Following Commands: Follows one step commands with increased time;Follows one step commands inconsistently Safety/Judgement: Decreased awareness of safety;Decreased awareness of deficits Awareness: Intellectual Problem Solving: Slow processing;Requires verbal cues General Comments: pt had cannula downsizing and is on [employer]      Exercises General Exercises - Upper Extremity Shoulder Flexion: Left;10 reps Shoulder ABduction: Left;10 reps Shoulder Horizontal ADduction: (IR and ER L elbow) Elbow Flexion: AAROM;Left;10 reps Elbow Extension: AAROM;Left;10 reps Wrist Flexion: AAROM;Left;10 reps Wrist Extension: AAROM;Left;10 reps Digit Composite Flexion: AAROM;Left;5 reps Composite Extension: AAROM;Left;10 reps General Exercises - Lower Extremity Ankle Circles/Pumps: AAROM;5 reps Heel Slides: AAROM;Both;10 reps Hip ABduction/ADduction: AAROM;Both;10 reps Straight Leg Raises: AAROM;Both;10 reps Hip Flexion/Marching: AAROM;Both;10 reps    General Comments  Pertinent Vitals/Pain Pain Assessment: No/denies pain    Home Living                      Prior Function            PT Goals (current goals can now be found in the care plan section) Acute Rehab PT Goals Patient Stated Goal: to get repositioned after PT Progress towards PT goals: Progressing toward goals    Frequency    Min 3X/week      PT Plan Current plan remains appropriate     Co-evaluation              AM-PAC PT "6 Clicks" Mobility   Outcome Measure  Help needed turning from your back to your side while in a flat bed without using bedrails?: Total Help needed moving from lying on your back to sitting on the side of a flat bed without using bedrails?: Total Help needed moving to and from a bed to a chair (including a wheelchair)?: Total Help needed standing up from a chair using your arms (e.g., wheelchair or bedside chair)?: Total Help needed to walk in hospital room?: Total Help needed climbing 3-5 steps with a railing? : Total 6 Click Score: 6    End of Session Equipment Utilized During Treatment: Oxygen Activity Tolerance: Patient tolerated treatment well Patient left: in bed;with call bell/phone within reach;with bed alarm set;with family/visitor present Nurse Communication: Mobility status PT Visit Diagnosis: Unsteadiness on feet (R26.81);Other abnormalities of gait and mobility (R26.89);Muscle weakness (generalized) (M62.81);Difficulty in walking, not elsewhere classified (R26.2)     Time: 8329-1916 PT Time Calculation (min) (ACUTE ONLY): 30 min  Charges:  $Therapeutic Exercise: 8-22 mins $Therapeutic Activity: 8-22 mins                   Ramond Dial 09/19/2019, 11:56 AM   Mee Hives, PT MS Acute Rehab Dept. Number: [place]

## 2019-09-20 DIAGNOSIS — R638 Other symptoms and signs concerning food and fluid intake: Secondary | ICD-10-CM

## 2019-09-20 DIAGNOSIS — I480 Paroxysmal atrial fibrillation: Secondary | ICD-10-CM

## 2019-09-20 DIAGNOSIS — D638 Anemia in other chronic diseases classified elsewhere: Secondary | ICD-10-CM

## 2019-09-20 DIAGNOSIS — I639 Cerebral infarction, unspecified: Secondary | ICD-10-CM | POA: Diagnosis not present

## 2019-09-20 DIAGNOSIS — U071 COVID-19: Secondary | ICD-10-CM | POA: Diagnosis not present

## 2019-09-20 DIAGNOSIS — D62 Acute posthemorrhagic anemia: Secondary | ICD-10-CM | POA: Diagnosis not present

## 2019-09-20 DIAGNOSIS — J1289 Other viral pneumonia: Secondary | ICD-10-CM | POA: Diagnosis not present

## 2019-09-20 DIAGNOSIS — L89152 Pressure ulcer of sacral region, stage 2: Secondary | ICD-10-CM

## 2019-09-20 DIAGNOSIS — G8194 Hemiplegia, unspecified affecting left nondominant side: Secondary | ICD-10-CM

## 2019-09-20 DIAGNOSIS — N186 End stage renal disease: Secondary | ICD-10-CM | POA: Diagnosis not present

## 2019-09-20 LAB — GLUCOSE, CAPILLARY
Glucose-Capillary: 74 mg/dL (ref 70–99)
Glucose-Capillary: 80 mg/dL (ref 70–99)
Glucose-Capillary: 81 mg/dL (ref 70–99)
Glucose-Capillary: 86 mg/dL (ref 70–99)
Glucose-Capillary: 90 mg/dL (ref 70–99)
Glucose-Capillary: 97 mg/dL (ref 70–99)

## 2019-09-20 MED ORDER — ASPIRIN EC 81 MG PO TBEC
81.0000 mg | DELAYED_RELEASE_TABLET | Freq: Every day | ORAL | Status: DC
  Administered 2019-09-21 – 2019-09-24 (×4): 81 mg via ORAL
  Filled 2019-09-20 (×4): qty 1

## 2019-09-20 MED ORDER — DARBEPOETIN ALFA 100 MCG/0.5ML IJ SOSY
PREFILLED_SYRINGE | INTRAMUSCULAR | Status: AC
  Filled 2019-09-20: qty 0.5

## 2019-09-20 MED ORDER — HEPARIN SODIUM (PORCINE) 1000 UNIT/ML IJ SOLN
INTRAMUSCULAR | Status: AC
  Administered 2019-09-20: 3800 [IU]
  Filled 2019-09-20: qty 4

## 2019-09-20 NOTE — Progress Notes (Signed)
Trach downsized, per order, from #6 to #4 cuffless.  Pt tolerated procedure well.  Trach position verified with ETCO2 detector, by RT x 2.  New trach ties and dressing applied.  Stoma was clean with no secretions or bleeding noted.  PMV was installed on new trach and Pt was able to speak without difficulty.

## 2019-09-20 NOTE — Progress Notes (Signed)
NAME:  [name], MRN:  [id number], DOB:  10-Sep-1951, LOS: 1 ADMISSION DATE:  07/25/2019, CONSULTATION DATE:  10/3 REFERRING MD:  Nadara Mustard, CHIEF COMPLAINT:  Dyspnea   Brief History   68 y/o male diagnosed with COVID on 9/29 presented to the [facility] ED on 10/2 with dyspnea, hypoxemia requiring intubation.  Admitted to [facility], developed AKI requiring CRRT, pneumothorax requiring chest tube and had a large bronchopleural fistula treated for 10 days with a bronchial blocker. Tracheostomy placed on 10/30. To [facility] 11/1 for IHD. Chest tube to suction, improvement in air leak. Waterseal 11/13  Past Medical History  GERD  Significant Hospital Events   10/03 Admit to [facility] from [facility] ER, start decadron and remdesivir, given tociluzimab; prone positioning 10/04 convalescent plasma 10/05 stop prone positioning 10/06 convalescent plasma; prone positioning again 10/09 start ABx 10/10 MSSA bacteremia, CVL d/ced; ID consulted; increase OG tube outpt 10/11 vent weaning trial started 10/13 fever, pneumothorax, pig tail chest tube placed 10/14 worsening hypotension and renal fx >> consulted nephrology; worsening PTX >> replaced chest tube; GNR in sputum >> ABx changed 10/15 start CRRT; endobronchial blocker placed for persistent air leak 10/16 endobronchial blocker repositioned, changed chest tube to water seal; melana with ABLA >> GI consulted; transfuse PRBC 10/17 persistent air leak, increased WOB; start nimbex gtt >> air leak decreased; EGD; A fib with RVR >> start amiodarone 10/18 transfuse PRBC; GI s/o 10/20 resume heparin gtt 10/21 stopped nimbex, chest tube to suction, stopped heparin drip because Hgb dropped again 10/22 TPA in chest tube, repositioned endobronchial blocker 10/23 deflated endobronchial balloon 10/24 small air leak  10/25 removed bronchial blocker 10/30 tracheostomy  11/1 D/C CRRT 11/2 Transfer to [facility] 11/3 Started on IHD 11/7 trach sutures removed 11/4 present, weaning on ATC  11/11 required phenylephrine to tolerate iHD 11/13 chest tube to waterseal 11/16 Chest tube removed  11/27 Downsize trach to 6 cuffless   Consults:  ID Nephrology Gastroenterology PCCM  Procedures:  10/2 R IJ CVL >> 10/10 10/13 Rt pig tail chest tube >> 10/14 10/14 R 3F chest tube >>11/16 10/14 L IJ HD cath >> 11/10 10/30 tracheostomy >>  11/10 tunneled HD cath >>  Significant Diagnostic Tests:  10/2 CT angiogram chest >> extensive bilateral airspace disease predominantly posterior 10/8 doppler legs b/l >> no DVT 10/15 renal u/s >> normal 10/17 EGD >> non bleeding gastric ulcer 10/19 Echo >> EF 60 to 65%, hyperdynamic LV with small LVOT gradient 10/22 CT chest >> large collection of air in pleural space with fluid, pneumonia bilaterally R>L endobronchial blocker in place, chest tube anterior 10/31 CXR >> persistent small right pneumothorax despite chest in adequate position MRI brain 11/3 >> small acute to subacute infarcts involving bilateral cerebral cortex and right cerebellum.  Large b/l mastoid effusions  Micro Data:  9/20 SARS COV 2 >> POSITIVE 10/2 blood >> negative 10/9 blood >> MSSA 2/4 10/9 resp >> MSSA, Pseudomonas 10/13 blood >> negative 10/21 bronch wash bacterial >> pseduomonas, acinetobacter baumannii 10/21 bronch wash fungal >  10/21 bronch wash aspergillus antigen> negative 10/24 bronchoscopy wash> pseudomonas 10/28 Blood cx>>> negative  10/28 Sputum cx>>> Pseudomonas 11/1 SARS COV 2 > negative 11/2 SARS CoV2 >> negative   Antimicrobials:  10/3 remdesivir > 10/6 10/3 actemra 10/3 decadron > 10/12 10/4 convalescent plasma 10/6 convalescent plasma  10/9 vancomycin > 10/10 10/9 cefepime > 10/10 10/10 ancef > 10/13 10/13 cefepime >10/20 10/14 anidulofungin > 10/15  10/20 ancef > 10/23 10/23 meropenem > >>>off  10/21 anidulofungin > 10/22 10/22 voriconazole > 10/23 10/28 Merrem >11/2 10/28 Vanc >10/30  10/31 Ancef > Tentative stop date  11/23   Interim history/subjective:  Pt. States he has been doing better. States his cough is better. Using PMV without difficulty   Objective   Blood pressure (!) 152/91, pulse (!) 105, temperature 99 F (37.2 C), temperature source Oral, resp. rate 18, height 5\' 10"  (1.778 m), weight 86.3 kg, SpO2 99 %.    FiO2 (%):  [28 %] 28 %   Intake/Output Summary (Last 24 hours) at 09/20/2019 1225 Last data filed at 09/20/2019 0900 Gross per 24 hour  Intake 1180 ml  Output 300 ml  Net 880 ml   Filed Weights   09/17/19 1658 09/17/19 2033 09/18/19 2136  Weight: 86.8 kg 86.8 kg 86.3 kg   Examination: General: Chronically ill appearing elderly male/male, in NAD HEENT: /AT, trach midline with secretions present, MM pink/moist, PERRL,  Neuro: Alert and interactive appropriately remains  very deconditioned and weak, follow commands,  CV: s1s2 regular rate and rhythm, no murmur, rubs, or gallops,  PULM:  Course breath sounds throughout, no increased work of breathing, thick yellow secretions GI: soft, bowel sounds active in all 4 quadrants, non-tender, non-distended, tolerating TF Extremities: warm/dry, no edema  Skin: no rashes or lesions   Assessment & Plan:   Acute respiratory failure with hypoxemia - 2/2 COVID-19 (s/p remdsevir, actemra, convalescent plasma, steroids)  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  Now with persistent airleak / BPF despite endobronchial blocker placement (removed 10/25 due to MSSA bacteremia) - off MV since 11/2 -Current Trach is 6 mm uncuffed - Goal is 4 mm uncuffed P: Continue ATC, Sats 98 on 28% Titrate FiO2 for sats of 88-92% If able to wean oxygen off please place on humidified air please  Downsize trach to 4 cuffless trach today 11/28 Trach care per protocol  No capping trials at this time  PCCM will continue to follow   APP CC Time 30 minutes   Signature:   Magdalen Spatz, MSN, AGACNP-BC  [place] Pager # [phone] After 4 pm please call [phone] 09/20/2019, 12:25 PM

## 2019-09-20 NOTE — Progress Notes (Signed)
PROGRESS NOTE  [name] EPP:[id number] DOB: Dec 23, 1950   PCP: Kathyrn Drown, MD  Patient is from: Home  DOA: 07/25/2019 LOS: 62  Brief Narrative / Interim history: 68 year old male with PMH of GERD and BPH, initially presented on 07/25/2019 with worsening dyspnea after recently testing positive for COVID-19 on 07/22/2019.  He required emergent intubation at [facility] ED.  He was admitted to the [facility] hospital for acute hypoxic respiratory failure secondary to severe ARDS from Covid pneumonia and completed Decadron, Remdesivir and Tocilizumab.  Hospital course complicated by acute kidney injury requiring CRRT, encephalopathy, pneumothorax complicated by persistent air leak and bronchopleural fistula requiring endobronchial blocker and pleur-evac, prolonged mechanical ventilation s/p tracheostomy on 10/30, MSSA bacteremia with negative echocardiogram intermittent pressor support with dialysis, melena with acute blood loss anemia requiring transfusion, IV PPI, empiric octreotide, EGD showed gastric ulcer and nonbleeding visible vessel clipped, transferred to [facility] on 08/26/2023 intermittent HD, subacute right cerebral/cerebellar infarcts on MRI.  PCCM following for tracheostomy management, weekly.  Nephrology on board for HD needs.  [facility] plans permanent dialysis access after patient improves overall.  Subjective: No major events overnight of this morning.  No major complaints identified cough which is improving.  He denies chest pain, dyspnea, GI or new focal neuro symptoms.  Objective: Vitals:   09/20/19 1053 09/20/19 1103 09/20/19 1110 09/20/19 1206  BP: (!) 142/80 137/88 137/79 (!) 152/91  Pulse: 95 98 (!) 106 (!) 105  Resp: _0 Temp:   98.8 F (37.1 C) 99 F (37.2 C)  TempSrc:    Oral  SpO2:    99%  Weight:      Height:        Intake/Output Summary (Last 24 hours) at 09/20/2019 1239 Last data filed at 09/20/2019 1200 Gross per 24 hour  Intake  1929.75 ml  Output 300 ml  Net 1629.75 ml   Filed Weights   09/17/19 1658 09/17/19 2033 09/18/19 2136  Weight: 86.8 kg 86.8 kg 86.3 kg    Examination:  GENERAL: No acute distress.  On HD. HEENT: MMM.  Vision and hearing grossly intact.  NECK: Tracheostomy RESP:  No IWOB. Good air movement bilaterally. CVS:  RRR. Heart sounds normal.  ABD/GI/GU: Bowel sounds present. Soft. Non tender.  MSK/EXT:  No apparent deformity or edema.  Left hemiparesis. SKIN: Stage II sacral wound as below. NEURO: Awake, alert and oriented appropriately.  Left hemiparesis. PSYCH: Calm. Normal affect.  Assessment & Plan: Acute hypoxic respiratory failure due to ARDS from Covid pneumonia status post tracheostomy -ARDS resolved.  Completed remdesivir, Decadron, Actemra and convalescent plasma therapy -COVID-19 negative x2 on 11/1 and 11/2. -PCCM following weekly-plan to downsize to 4 cuffless 11/28 , capping and decannulation 11/29  Cough: Concern about silent aspiration.  CXR without acute finding.  Had mild temperature 99's but no significant fever.  Marginal leukocytosis -Continue duo nebs and doxycycline -We will add antitussives if worse.  CKD-3 progressed to ESRD.  Now on HD TTS -Nephrology managing -HD 11/21>> -Plan for permanent access by [facility] down the road.  Right pneumothorax/empyema/necrotic RLL/bronchopleural fistula with large air leak: Stable. Chest tube removed by CTS on 11/16.  Follow-up serial CXRs without new pneumonia -Continue monitoring clinically  Acute on chronic anemia: Multifactorial including blood loss from gastric ulcer, renal disease and acute illness.  Stable. -Had EGD with clipping of culprit vessel -Monitor H&H -Aranesp and iron supplementation per nephrology  H. pylori infection -GI recommends waiting until clinically stable  after discharge and starting 2 weeks regimen to prevent ulcer recurrence.GI (per Dr. Cristina Gong 10/27) to follow up in 2-3 months from  hospitalization to arrange this and possible f/u EGD.   CVA with left hemiparesis: Small bilateral cerebral and right cerebellar infarct on 11/3.  Potential etiologies included hypotension in the setting of HD and sepsis and atrial fibrillation.  Concern about anticoagulation given GI bleed.  Previously, neurology recommended full dose aspirin 325 mg daily.  Discussed with Dr. Erlinda Hong 11/28 about high dose aspirin in the setting of GI bleed and H. Pylori.  He suggested enteric-coated aspirin 81 mg daily or Plavix if okay from GI standpoint. -Change it to enteric-coated aspirin.  We will discuss with GI about Plavix after dialysis access by [facility]. -Continue statin.  Dysphagia:  -SLP upgraded to dysphagia 2 diet and nectar thickened liquids but not meeting adequate caloric intake. -Continue TF per cortrack -Continue calorie count-if inadequate oral intake, will discuss about PEG tube  Acute metabolic encephalopathy/anoxic brain injury: Concern on MRI brain on 11/3.  Encephalopathy resolved.   Paroxysmal A. Fib  MSSA bacteremia: TTE negative.  Did not get TEE.  Completed 6 weeks of antibiotic course with Ancef on 11/23.  Pseudomonas and Acinetobacter in BAL: Resolved. -Completed 10 days course of meropenem.  Elevated D-dimer: Likely due to acute illness as above, particularly Covid.  Dopplers negative for DVT on 10/8.   Physical deconditioning/severe debility -PT/OT  Pressure Injury 08/24/19 Sacrum Right;Left;Mid Stage II -  Partial thickness loss of dermis presenting as a shallow open ulcer with a red, pink wound bed without slough.  (Active)  08/24/19 0800  Location: Sacrum  Location Orientation: Right;Left;Mid  Staging: Stage II -  Partial thickness loss of dermis presenting as a shallow open ulcer with a red, pink wound bed without slough.  Wound Description (Comments): -- (stage 11 mid sacrum)  Present on Admission: No     Nutrition Problem: Increased nutrient needs Etiology: chronic  illness  Signs/Symptoms: estimated needs  Interventions: Magic cup, MVI, Tube feeding, Calorie Count, Refer to RD note for recommendations   DVT prophylaxis: SCD Code Status: DNR/DNI Family Communication: Patient and/or RN. Available if any question. Disposition Plan: Remains inpatient Consultants: Neurology (signed off), GI (signed off), PCCM, nephrology  Procedures:  Chest tube removed, 11/16 Tunneled HD cath, 11/10 HD, 11/3 Tracheostomy 10/30 EGD 10/17. Gastric ulcer, nonbleeding visible vessel clipped CRRT 10/15 Bronchoscopy with endobronchial ballon, 10/15, repeat 10/16 Right-sided chest tube, 10/14 Right pigtail chest tube--for right PTX 10/13-2/14 Right IJ 10/2-10/10 ETT 10/2  Microbiology summarized: 10/2-blood cultures negative 10/3, 10/10-MRSA PCR negative 10/9-blood culture MSSA 10/9-urine culture insignificant growth. 10/9-respiratory culture with moderate staph aureus, rare Pseudomonas 10/13-blood cultures negative x5-day 10/21-BAL with Pseudomonas and Acinetobacter 10/21 and 10/24 BAL fungal cultures negative 10/28-sputum culture with Pseudomonas 10/28-blood cultures negative 11/1 and 11/2-COVID-19 negative   Sch Meds:  Scheduled Meds: . aspirin  325 mg Per Tube Daily  . atorvastatin  40 mg Oral q1800  . budesonide (PULMICORT) nebulizer solution  0.5 mg Nebulization BID  . chlorhexidine  15 mL Mouth/Throat BID  . Chlorhexidine Gluconate Cloth  6 each Topical Q0600  . Chlorhexidine Gluconate Cloth  6 each Topical Q0600  . darbepoetin (ARANESP) injection - DIALYSIS  100 mcg Intravenous Q Sat-HD  . heparin injection (subcutaneous)  5,000 Units Subcutaneous Q8H  . ipratropium-albuterol  3 mL Nebulization BID  . mouth rinse  15 mL Mouth Rinse 10 times per day  . multivitamin  1 tablet Oral  QHS  . pantoprazole sodium  40 mg Per Tube Daily  . sucralfate  1 g Oral Q6H   Continuous Infusions: . sodium chloride Stopped (09/15/19 2208)  . dextrose Stopped  (08/16/19 1623)  . dextrose 50 mL/hr at 09/19/19 2248  . doxycycline (VIBRAMYCIN) IV 100 mg (09/20/19 0447)  . feeding supplement (NEPRO CARB STEADY) Stopped (09/19/19 0148)  . ferric gluconate (FERRLECIT/NULECIT) IV 125 mg (09/20/19 1220)   PRN Meds:.sodium chloride, acetaminophen (TYLENOL) oral liquid 160 mg/5 mL, dextrose, guaiFENesin-dextromethorphan, ondansetron (ZOFRAN) IV, pneumococcal 23 valent vaccine, polyethylene glycol, polyvinyl alcohol, Resource ThickenUp Clear, Resource ThickenUp Clear, white petrolatum  Antimicrobials: Anti-infectives (From admission, onward)   Start     Dose/Rate Route Frequency Ordered Stop   09/16/19 1700  doxycycline (VIBRAMYCIN) 100 mg in sodium chloride 0.9 % 250 mL IVPB     100 mg 125 mL/hr over 120 Minutes Intravenous Every 12 hours 09/16/19 1540     08/26/19 2200  ceFAZolin (ANCEF) IVPB 1 g/50 mL premix     1 g 100 mL/hr over 30 Minutes Intravenous Every 24 hours 08/24/19 1021 09/15/19 2202   08/24/19 1100  meropenem (MERREM) 1 g in sodium chloride 0.9 % 100 mL IVPB     1 g 200 mL/hr over 30 Minutes Intravenous Every 24 hours 08/24/19 1021 08/25/19 1156   08/23/19 1100  ceFAZolin (ANCEF) IVPB 2g/100 mL premix  Status:  Discontinued     2 g 200 mL/hr over 30 Minutes Intravenous Every 12 hours 08/23/19 1048 08/24/19 1021   08/21/19 1700  vancomycin (VANCOCIN) IVPB 750 mg/150 ml premix  Status:  Discontinued     750 mg 150 mL/hr over 60 Minutes Intravenous Every 24 hours 08/21/19 0843 08/23/19 1048   08/21/19 1400  vancomycin (VANCOCIN) IVPB 750 mg/150 ml premix  Status:  Discontinued     750 mg 150 mL/hr over 60 Minutes Intravenous Every 24 hours 08/20/19 1146 08/20/19 1218   08/21/19 1000  meropenem (MERREM) 1 g in sodium chloride 0.9 % 100 mL IVPB  Status:  Discontinued     1 g 200 mL/hr over 30 Minutes Intravenous Every 12 hours 08/20/19 1535 08/23/19 1048   08/21/19 0413  meropenem (MERREM) 500 mg in sodium chloride 0.9 % 100 mL IVPB   Status:  Discontinued     500 mg 200 mL/hr over 30 Minutes Intravenous Every 24 hours 08/20/19 1245 08/20/19 1535   08/20/19 1330  vancomycin (VANCOCIN) 1,750 mg in sodium chloride 0.9 % 500 mL IVPB     1,750 mg 250 mL/hr over 120 Minutes Intravenous  Once 08/20/19 1222 08/20/19 1619   08/20/19 1300  vancomycin (VANCOCIN) 1,250 mg in sodium chloride 0.9 % 250 mL IVPB  Status:  Discontinued     1,250 mg 166.7 mL/hr over 90 Minutes Intravenous  Once 08/20/19 1146 08/20/19 1218   08/20/19 1224  vancomycin variable dose per unstable renal function (pharmacist dosing)  Status:  Discontinued      Does not apply See admin instructions 08/20/19 1225 08/21/19 1152   08/15/19 1600  meropenem (MERREM) 1 g in sodium chloride 0.9 % 100 mL IVPB  Status:  Discontinued     1 g 200 mL/hr over 30 Minutes Intravenous Every 12 hours 08/15/19 1543 08/20/19 1245   08/15/19 1000  voriconazole (VFEND) tablet 200 mg  Status:  Discontinued     200 mg Per Tube Every 12 hours 08/14/19 1148 08/15/19 1134   08/14/19 1600  anidulafungin (ERAXIS) 100 mg in sodium chloride  0.9 % 100 mL IVPB  Status:  Discontinued     100 mg 78 mL/hr over 100 Minutes Intravenous Every 24 hours 08/13/19 1645 08/14/19 1148   08/14/19 1300  voriconazole (VFEND) tablet 400 mg     400 mg Per Tube Every 12 hours 08/14/19 1148 08/14/19 2217   08/13/19 1700  anidulafungin (ERAXIS) 200 mg in sodium chloride 0.9 % 200 mL IVPB     200 mg 78 mL/hr over 200 Minutes Intravenous  Once 08/13/19 1645 08/13/19 2030   08/13/19 0200  ceFAZolin (ANCEF) IVPB 2g/100 mL premix  Status:  Discontinued     2 g 200 mL/hr over 30 Minutes Intravenous Every 12 hours 08/11/19 1512 08/15/19 1543   08/12/19 0200  ceFEPIme (MAXIPIME) 2 g in sodium chloride 0.9 % 100 mL IVPB     2 g 200 mL/hr over 30 Minutes Intravenous Every 12 hours 08/11/19 1509 08/12/19 1341   08/07/19 2200  ceFEPIme (MAXIPIME) 2 g in sodium chloride 0.9 % 100 mL IVPB  Status:  Discontinued     2 g  200 mL/hr over 30 Minutes Intravenous Every 12 hours 08/07/19 1540 08/11/19 1509   08/07/19 1000  anidulafungin (ERAXIS) 100 mg in sodium chloride 0.9 % 100 mL IVPB  Status:  Discontinued     100 mg 78 mL/hr over 100 Minutes Intravenous Every 24 hours 08/06/19 0934 08/08/19 1203   08/07/19 0800  ceFEPIme (MAXIPIME) 2 g in sodium chloride 0.9 % 100 mL IVPB  Status:  Discontinued     2 g 200 mL/hr over 30 Minutes Intravenous Every 24 hours 08/06/19 1036 08/07/19 1540   08/06/19 1000  anidulafungin (ERAXIS) 200 mg in sodium chloride 0.9 % 200 mL IVPB    Note to Pharmacy: please   200 mg 78 mL/hr over 200 Minutes Intravenous Every 24 hours 08/06/19 0925 08/06/19 1600   08/06/19 0800  ceFEPIme (MAXIPIME) 2 g in sodium chloride 0.9 % 100 mL IVPB  Status:  Discontinued     2 g 200 mL/hr over 30 Minutes Intravenous Every 12 hours 08/06/19 0733 08/06/19 1036   08/02/19 2200  ceFAZolin (ANCEF) IVPB 2g/100 mL premix  Status:  Discontinued     2 g 200 mL/hr over 30 Minutes Intravenous Every 8 hours 08/02/19 1338 08/06/19 0733   08/02/19 1400  vancomycin (VANCOCIN) 1,250 mg in sodium chloride 0.9 % 250 mL IVPB  Status:  Discontinued     1,250 mg 166.7 mL/hr over 90 Minutes Intravenous Every 24 hours 08/01/19 1320 08/02/19 1332   08/01/19 1330  vancomycin (VANCOCIN) 2,000 mg in sodium chloride 0.9 % 500 mL IVPB     2,000 mg 250 mL/hr over 120 Minutes Intravenous  Once 08/01/19 1158 08/01/19 1636   08/01/19 1300  ceFEPIme (MAXIPIME) 2 g in sodium chloride 0.9 % 100 mL IVPB  Status:  Discontinued     2 g 200 mL/hr over 30 Minutes Intravenous Every 12 hours 08/01/19 1158 08/02/19 1332   07/26/19 1600  remdesivir 100 mg in sodium chloride 0.9 % 250 mL IVPB     100 mg 500 mL/hr over 30 Minutes Intravenous Every 24 hours 07/26/19 0132 07/29/19 1823   07/26/19 0215  remdesivir 200 mg in sodium chloride 0.9 % 250 mL IVPB     200 mg 500 mL/hr over 30 Minutes Intravenous Once 07/26/19 0132 07/26/19 0520        I have personally reviewed the following labs and images: CBC: Recent Labs  Lab 09/13/19 1303  09/14/19 0401 09/18/19 1411 09/19/19 1548  WBC 15.5* 12.2* 12.6* 11.5*  NEUTROABS  --   --   --  7.8*  HGB 7.2* 8.6* 7.3* 8.1*  HCT 23.2* 27.6* 24.1* 25.9*  MCV 92.4 93.9 94.5 93.2  PLT 266 295 311 359   BMP &GFR Recent Labs  Lab 09/13/19 1303 09/17/19 1115 09/18/19 1411 09/19/19 1548  NA 134* 134* 134* 132*  K 3.5 3.8 4.1 4.4  CL 95* 97* 97* 95*  CO2 _0 GLUCOSE 90 128* 105* 96  BUN 60* 38* 31* 44*  CREATININE 4.37* 4.25* 3.52* 4.95*  CALCIUM 8.5* 8.6* 8.6* 8.8*  PHOS 3.1 2.3* 2.2* 3.0   Estimated Creatinine Clearance: 15.2 mL/min (A) (by C-G formula based on SCr of 4.95 mg/dL (H)). Liver & Pancreas: Recent Labs  Lab 09/13/19 1303 09/17/19 1115 09/18/19 1411 09/19/19 1548  ALBUMIN 1.7* 1.9* 1.8* 1.9*   No results for input(s): LIPASE, AMYLASE in the last 168 hours. No results for input(s): AMMONIA in the last 168 hours. Diabetic: No results for input(s): HGBA1C in the last 72 hours. Recent Labs  Lab 09/19/19 2008 09/19/19 2351 09/20/19 0418 09/20/19 0722 09/20/19 1204  GLUCAP 87 93 80 74 81   Cardiac Enzymes: No results for input(s): CKTOTAL, CKMB, CKMBINDEX, TROPONINI in the last 168 hours. No results for input(s): PROBNP in the last 8760 hours. Coagulation Profile: No results for input(s): INR, PROTIME in the last 168 hours. Thyroid Function Tests: No results for input(s): TSH, T4TOTAL, FREET4, T3FREE, THYROIDAB in the last 72 hours. Lipid Profile: No results for input(s): CHOL, HDL, LDLCALC, TRIG, CHOLHDL, LDLDIRECT in the last 72 hours. Anemia Panel: No results for input(s): VITAMINB12, FOLATE, FERRITIN, TIBC, IRON, RETICCTPCT in the last 72 hours. Urine analysis:    Component Value Date/Time   COLORURINE YELLOW 08/01/2019 1745   APPEARANCEUR HAZY (A) 08/01/2019 1745   LABSPEC 1.023 08/01/2019 1745   PHURINE 5.0 08/01/2019 1745    GLUCOSEU NEGATIVE 08/01/2019 1745   HGBUR NEGATIVE 08/01/2019 1745   BILIRUBINUR NEGATIVE 08/01/2019 1745   KETONESUR NEGATIVE 08/01/2019 1745   PROTEINUR NEGATIVE 08/01/2019 1745   NITRITE NEGATIVE 08/01/2019 1745   LEUKOCYTESUR NEGATIVE 08/01/2019 1745   Sepsis Labs: Invalid input(s): PROCALCITONIN, [place]  Microbiology: No results found for this or any previous visit (from the past 240 hour(s)).  Radiology Studies: No results found.  45 minutes with more than 50% spent in reviewing records, counseling patient/family and coordinating care.  Aanyah Loa T. [place]  If 7PM-7AM, please contact night-coverage [email or web address] Password [facility] 09/20/2019, 12:39 PM

## 2019-09-20 NOTE — Progress Notes (Addendum)
[facility] Progress Note   Subjective:   Patient seen on HD. Feels cough is slightly improved. Reports nausea last night, now resolved. Denies SOB, CP, palpitations, abdominal pain, N/V/D.   Objective Vitals:   09/19/19 2114 09/20/19 0142 09/20/19 0418 09/20/19 0500  BP:   (!) 150/86   Pulse: 92 95 96 91  Resp: 20 18 20 18   Temp:   98.2 F (36.8 C)   TempSrc:      SpO2: 97% 100% 100% 100%  Weight:      Height:       Physical Exam General:Chronically ill appearing male, appears comfortable, in NAD. Heart:RRR, no murmurs, rubs or gallops. Lungs:Trach in place. Auscultated anteriorly, decreased breath sounds but no wheezing, rhonchi or rales auscultated. Occasional cough.  Abdomen:Soft, non-distended, +BS Extremities:Trace pedal edema b/l lower extremities Dialysis Access:L IJ [facility] currently accessed  Additional Objective Labs: Basic Metabolic Panel: Recent Labs  Lab 09/17/19 1115 09/18/19 1411 09/19/19 1548  NA 134* 134* 132*  K 3.8 4.1 4.4  CL 97* 97* 95*  CO2 26 27 26   GLUCOSE 128* 105* 96  BUN 38* 31* 44*  CREATININE 4.25* 3.52* 4.95*  CALCIUM 8.6* 8.6* 8.8*  PHOS 2.3* 2.2* 3.0   Liver Function Tests: Recent Labs  Lab 09/17/19 1115 09/18/19 1411 09/19/19 1548  ALBUMIN 1.9* 1.8* 1.9*   CBC: Recent Labs  Lab 09/13/19 1303 09/14/19 0401 09/18/19 1411 09/19/19 1548  WBC 15.5* 12.2* 12.6* 11.5*  NEUTROABS  --   --   --  7.8*  HGB 7.2* 8.6* 7.3* 8.1*  HCT 23.2* 27.6* 24.1* 25.9*  MCV 92.4 93.9 94.5 93.2  PLT 266 295 311 359   Blood Culture    Component Value Date/Time   SDES  08/20/2019 1254    TRACHEAL ASPIRATE Performed at [facility], [place] [address] [place], [place] [address]    SPECREQUEST  08/20/2019 1254    NONE Performed at [facility], [place] [address] [place], [place] [address]    CULT RARE PSEUDOMONAS AERUGINOSA 08/20/2019 1254   REPTSTATUS 08/23/2019 FINAL 08/20/2019  1254    CBG: Recent Labs  Lab 09/19/19 1634 09/19/19 2008 09/19/19 2351 09/20/19 0418 09/20/19 0722  GLUCAP 88 87 93 80 74   Medications: . sodium chloride Stopped (09/15/19 2208)  . dextrose Stopped (08/16/19 1623)  . dextrose 50 mL/hr at 09/19/19 2248  . doxycycline (VIBRAMYCIN) IV 100 mg (09/20/19 0447)  . feeding supplement (NEPRO CARB STEADY) Stopped (09/19/19 0148)  . ferric gluconate (FERRLECIT/NULECIT) IV     . aspirin  325 mg Per Tube Daily  . atorvastatin  40 mg Oral q1800  . budesonide (PULMICORT) nebulizer solution  0.5 mg Nebulization BID  . chlorhexidine  15 mL Mouth/Throat BID  . Chlorhexidine Gluconate Cloth  6 each Topical Q0600  . Chlorhexidine Gluconate Cloth  6 each Topical Q0600  . darbepoetin (ARANESP) injection - DIALYSIS  100 mcg Intravenous Q Sat-HD  . heparin injection (subcutaneous)  5,000 Units Subcutaneous Q8H  . ipratropium-albuterol  3 mL Nebulization BID  . mouth rinse  15 mL Mouth Rinse 10 times per day  . multivitamin  1 tablet Oral QHS  . pantoprazole sodium  40 mg Per Tube Daily  . sucralfate  1 g Oral Q6H    Dialysis Orders: New start ESRD  Assessment/Plan: 1.S/p acute hypoxemia and respiratory failure due to COVID 19: Chest tube out, still with trach. Downsizing cuff per pulm. Will continue fluid removal with  HD. 2. ESRD:New start this admission, on TTS schedule but holiday schedule this week. Resume TTS schedule on Saturday. No signs of renal recovery at this point. Will plan for permanent access once patient is more stable. 3.HTN/volume:BP improved on HD. Continue to remove volume with HD as tolerated.Cont to lower volume 3- 3.5kg per treatment, needs lower volume.  4. Anemia:Hgb 8.6 on 11/22 >> 7.8 on 11/27.  GI bleed this admission. No heparin with HD. On aranesp, next dose tomorrow. receiving iron. 5. Secondary hyperparathyroidism:Corrected calcium 10.5. Not on calcium or vit D, use low Ca bath. Phos 2.1 >2.2 >3.0, not  on binder, will trend and replete as needed. 6.CVA:Bilatcerebral cortex and right cerebellum small acute to subacute infarctswithNeuromuscularweakness/encephalopathy.See disposition 6. Dispo-Cannot do OP HD with trachand will need to tolerate travel/4 hours in chair.Very debilitated- cannot turn in bed, cant sit up for OP HD - being told that LTAC is not appropriate due to trach but maybe due to this issue of debility- not able to do OP HD or get AVF at this time.  Anice Paganini, PA-C 09/20/2019, 8:50 AM  [facility] Pager: [phone]  Pt seen, examined and agree w A/P as above.  [name]  MD 09/20/2019, 5:18 PM

## 2019-09-20 NOTE — Plan of Care (Signed)
Discussed with Dr. Cyndia Skeeters over the phone. Pt is on ASA 325mg , given his recent GIB and GI procedure, we agree to switch his ASA 325mg  to ASA EC 81mg  which may decreased his GIB risk. However, Dr. Cyndia Skeeters will discuss with GI later to see if this regimen is OK. If ASA EC 81mg  still pose risk for GIB in this case, may consider either plavix or hold off antiplatelet until GI clears him. Not anticoagulation candidate at this time for his PAF.   Rosalin Hawking, MD PhD Stroke Neurology 09/20/2019 1:02 PM

## 2019-09-21 DIAGNOSIS — J1289 Other viral pneumonia: Secondary | ICD-10-CM | POA: Diagnosis not present

## 2019-09-21 DIAGNOSIS — D638 Anemia in other chronic diseases classified elsewhere: Secondary | ICD-10-CM | POA: Diagnosis not present

## 2019-09-21 DIAGNOSIS — I639 Cerebral infarction, unspecified: Secondary | ICD-10-CM | POA: Diagnosis not present

## 2019-09-21 DIAGNOSIS — N186 End stage renal disease: Secondary | ICD-10-CM | POA: Diagnosis not present

## 2019-09-21 DIAGNOSIS — U071 COVID-19: Secondary | ICD-10-CM | POA: Diagnosis not present

## 2019-09-21 DIAGNOSIS — J9601 Acute respiratory failure with hypoxia: Secondary | ICD-10-CM | POA: Diagnosis not present

## 2019-09-21 DIAGNOSIS — D62 Acute posthemorrhagic anemia: Secondary | ICD-10-CM | POA: Diagnosis not present

## 2019-09-21 LAB — CBC
HCT: 23.4 % — ABNORMAL LOW (ref 39.0–52.0)
Hemoglobin: 7.3 g/dL — ABNORMAL LOW (ref 13.0–17.0)
MCH: 29 pg (ref 26.0–34.0)
MCHC: 31.2 g/dL (ref 30.0–36.0)
MCV: 92.9 fL (ref 80.0–100.0)
Platelets: 351 10*3/uL (ref 150–400)
RBC: 2.52 MIL/uL — ABNORMAL LOW (ref 4.22–5.81)
RDW: 14.6 % (ref 11.5–15.5)
WBC: 10.8 10*3/uL — ABNORMAL HIGH (ref 4.0–10.5)
nRBC: 0 % (ref 0.0–0.2)

## 2019-09-21 LAB — RENAL FUNCTION PANEL
Albumin: 1.7 g/dL — ABNORMAL LOW (ref 3.5–5.0)
Anion gap: 12 (ref 5–15)
BUN: 25 mg/dL — ABNORMAL HIGH (ref 8–23)
CO2: 24 mmol/L (ref 22–32)
Calcium: 8.7 mg/dL — ABNORMAL LOW (ref 8.9–10.3)
Chloride: 97 mmol/L — ABNORMAL LOW (ref 98–111)
Creatinine, Ser: 3.79 mg/dL — ABNORMAL HIGH (ref 0.61–1.24)
GFR calc Af Amer: 18 mL/min — ABNORMAL LOW (ref >60)
GFR calc non Af Amer: 16 mL/min — ABNORMAL LOW (ref >60)
Glucose, Bld: 93 mg/dL (ref 70–99)
Phosphorus: 3 mg/dL (ref 2.5–4.6)
Potassium: 4 mmol/L (ref 3.5–5.1)
Sodium: 133 mmol/L — ABNORMAL LOW (ref 135–145)

## 2019-09-21 LAB — RETICULOCYTES
Immature Retic Fract: 34.7 % — ABNORMAL HIGH (ref 2.3–15.9)
RBC.: 2.68 MIL/uL — ABNORMAL LOW (ref 4.22–5.81)
Retic Count, Absolute: 89 10*3/uL (ref 19.0–186.0)
Retic Ct Pct: 3.3 % — ABNORMAL HIGH (ref 0.4–3.1)

## 2019-09-21 LAB — GLUCOSE, CAPILLARY
Glucose-Capillary: 88 mg/dL (ref 70–99)
Glucose-Capillary: 91 mg/dL (ref 70–99)
Glucose-Capillary: 94 mg/dL (ref 70–99)
Glucose-Capillary: 89 mg/dL (ref 70–99)
Glucose-Capillary: 92 mg/dL (ref 70–99)

## 2019-09-21 LAB — IRON AND TIBC
Iron: 23 ug/dL — ABNORMAL LOW (ref 45–182)
Saturation Ratios: 17 % — ABNORMAL LOW (ref 17.9–39.5)
TIBC: 136 ug/dL — ABNORMAL LOW (ref 250–450)
UIBC: 113 ug/dL

## 2019-09-21 LAB — FOLATE: Folate: 22.3 ng/mL (ref >5.9)

## 2019-09-21 LAB — FERRITIN: Ferritin: 1292 ng/mL — ABNORMAL HIGH (ref 24–336)

## 2019-09-21 LAB — VITAMIN B12: Vitamin B-12: 1022 pg/mL — ABNORMAL HIGH (ref 180–914)

## 2019-09-21 NOTE — Progress Notes (Signed)
Trach capped per MD order.  Pt tolerated procedure well, with no distress noted.  Pt's O2 remained stable on RA.

## 2019-09-21 NOTE — Progress Notes (Signed)
  S: Seen in f/u for resp failure after multiple issues related to COVID. Doing well with PMV  O: 4 cuffless shiley in place Lungs scattered rhonci Globally weak  A/P: Chronic resp failure after COVID and BP fistula, improved - Cap trach today - Decannulate in AM if stable - Continue aggressive PT/OT  09/18/2019 Erskine Emery MD

## 2019-09-21 NOTE — Progress Notes (Signed)
PROGRESS NOTE  [name] NFA:[id number] DOB: 1951-07-27   PCP: Kathyrn Drown, MD  Patient is from: Home  DOA: 07/25/2019 LOS: 66  Brief Narrative / Interim history: 68 year old male with PMH of GERD and BPH, initially presented on 07/25/2019 with worsening dyspnea after recently testing positive for COVID-19 on 07/22/2019.  He required emergent intubation at [facility] ED.  He was admitted to the [facility] hospital for acute hypoxic respiratory failure secondary to severe ARDS from Covid pneumonia and completed Decadron, Remdesivir and Tocilizumab.  Hospital course complicated by acute kidney injury requiring CRRT, encephalopathy, pneumothorax complicated by persistent air leak and bronchopleural fistula requiring endobronchial blocker and pleur-evac, prolonged mechanical ventilation s/p tracheostomy on 10/30, MSSA bacteremia with negative echocardiogram intermittent pressor support with dialysis, melena with acute blood loss anemia requiring transfusion, IV PPI, empiric octreotide, EGD showed gastric ulcer and nonbleeding visible vessel clipped, transferred to [facility] on 08/26/2023 intermittent HD, subacute right cerebral/cerebellar infarcts on MRI.  PCCM following for tracheostomy management, weekly.  Nephrology on board for HD needs.  VVS plans permanent dialysis access after patient improves overall.  Subjective: No major events overnight of this morning.  He asks if cortrack can be taken out. Per RN the cortrack has been clogged and not in use now.  However, he has been eating 25 to 50% of his meals so far.  Otherwise, no major events or complaint this morning.  He denies headache, vision change, chest pain, dyspnea, GI or UTI symptoms.  Objective: Vitals:   09/21/19 0335 09/21/19 0905 09/21/19 0910 09/21/19 0920  BP: (!) 147/89   (!) 141/87  Pulse: (!) 102 (!) 114  (!) 101  Resp: 16 (!) 22  20  Temp: 98.7 F (37.1 C)   98.6 F (37 C)  TempSrc: Oral   Oral  SpO2: 100%  97% 94% 96%  Weight:      Height:        Intake/Output Summary (Last 24 hours) at 09/21/2019 1154 Last data filed at 09/21/2019 1000 Gross per 24 hour  Intake 3245.26 ml  Output 200 ml  Net 3045.26 ml   Filed Weights   09/17/19 2033 09/18/19 2136 09/20/19 1952  Weight: 86.8 kg 86.3 kg 86.5 kg    Examination:  GENERAL: No acute distress.  Appears well.  HEENT: MMM.  Vision and hearing grossly intact.  Cortrack in place. NECK: Trach with PMV RESP:  No IWOB. Good air movement bilaterally. CVS:  RRR. Heart sounds normal.  ABD/GI/GU: Bowel sounds present. Soft. Non tender.  MSK/EXT: Left hemiparesis.  No edema. SKIN: no apparent skin lesion or wound NEURO: Awake, alert and oriented x4 except time.  Left hemiparesis. PSYCH: Calm. Normal affect.  Assessment & Plan: Acute hypoxic respiratory failure due to ARDS from Covid pneumonia status post trach Right pneumothorax/empyema/necrotic RLL/bronchopleural fistula with large air leak Pseudomonas and Acinetobacter in BAL: completed 10 days course of meropenem. -COVID-19 positive on 07/13/2019. S/p remdesivir, Decadron, Actemra and convalescent plasma therapy -ARDS resolved.  COVID-19 negative x2 on 08/24/19 and 08/25/19. -Chest tube removed by CTS on 11/16.  Follow-up serial CXRs without new pneumonia -PCCM following weekly- downsized to 4 cuffless with PMV 11/28. Hope to cap and decannulate 11/29  Cough: concern about silent aspiration.  CXR without acute finding.  Had mild temperature 99's but no significant fever.  Marginal leukocytosis that is resolving. -Continue duo nebs and doxycycline -We will add antitussives if worse.  CKD-3 progressed to ESRD: now on HD TTS Bone  mineral disorder -Nephrology managing -HD 11/21>> -Plan for permanent access when stable and stronger  Acute on chronic anemia: Multifactorial-blood loss from gastric ulcer, renal disease and acute illness.  Had EGD with clipping of culprit vessel.  Relatively  stable. -Monitor H&H -Aranesp and iron supplementation per nephrology  H. pylori infection -Per previous attending, GI, Dr. Cristina Gong to follow up in 2-3 months from hospitalization to arrange 2-week treatment and possible f/u EGD.  CVA with left hemiparesis and dysphagia: small bilateral cerebral and right cerebellar infarct on 11/3.  -No anticoagulation given GI bleed.   -Changed from full dose aspirin to [facility] aspirin 81 mg daily after discussion with neurology, Dr. Erlinda Hong on 11/28.   -Will discuss with GI about Plavix after permanent HD access -Continue statin.  Dysphagia:  -SLP upgraded to dysphagia-2 diet and nectar thickened liquids but not meeting adequate caloric intake. -cortrack team to evaluate clogged cortrak in the morning -Continue calorie count-if inadequate oral intake, will discuss about PEG tube  Acute metabolic encephalopathy/anoxic brain injury: concern on MRI brain on 11/3.  Encephalopathy resolved.  Oriented x4 except time.  Paroxysmal A. Fib: Now in sinus rhythm without rate or rhythm control medication. -Not on anticoagulation due to GI bleed  MSSA bacteremia: TTE negative.  Did not get TEE.  Completed 6 weeks of antibiotic course with Ancef on 11/23.  Elevated D-dimer: Likely due to acute illness as above, particularly Covid.  Dopplers negative for DVT on 10/8.   Physical deconditioning/severe debility -PT/OT  Pressure Injury 08/24/19 Sacrum Right;Left;Mid Stage II -  Partial thickness loss of dermis presenting as a shallow open ulcer with a red, pink wound bed without slough.  (Active)  08/24/19 0800  Location: Sacrum  Location Orientation: Right;Left;Mid  Staging: Stage II -  Partial thickness loss of dermis presenting as a shallow open ulcer with a red, pink wound bed without slough.  Wound Description (Comments): -- (stage 11 mid sacrum)  Present on Admission: No     Nutrition Problem: Increased nutrient needs Etiology: chronic illness  Signs/Symptoms:  estimated needs  Interventions: Magic cup, MVI, Tube feeding, Calorie Count, Refer to RD note for recommendations   DVT prophylaxis: SCD Code Status: DNR/DNI Family Communication: Patient and/or RN. Available if any question. Disposition Plan: Remains inpatient Consultants: Neurology (signed off), GI (signed off), PCCM, nephrology  Procedures:  Chest tube removed, 11/16 Tunneled HD cath, 11/10 HD, 11/3 Tracheostomy 10/30 EGD 10/17. Gastric ulcer, nonbleeding visible vessel clipped CRRT 10/15 Bronchoscopy with endobronchial ballon, 10/15, repeat 10/16 Right-sided chest tube, 10/14 Right pigtail chest tube--for right PTX 10/13-2/14 Right IJ 10/2-10/10 ETT 10/2  Microbiology summarized: 10/20-COVID-19 positive. 10/2-blood cultures negative 10/3, 10/10-MRSA PCR negative 10/9-blood culture MSSA 10/9-urine culture insignificant growth. 10/9-respiratory culture with moderate staph aureus, rare Pseudomonas 10/13-blood cultures negative x5-day 10/21-BAL with Pseudomonas and Acinetobacter 10/21 and 10/24 BAL fungal cultures negative 10/28-sputum culture with Pseudomonas 10/28-blood cultures negative 11/1 and 11/2-COVID-19 negative   Sch Meds:  Scheduled Meds: . aspirin EC  81 mg Oral Daily  . atorvastatin  40 mg Oral q1800  . budesonide (PULMICORT) nebulizer solution  0.5 mg Nebulization BID  . chlorhexidine  15 mL Mouth/Throat BID  . Chlorhexidine Gluconate Cloth  6 each Topical Q0600  . Chlorhexidine Gluconate Cloth  6 each Topical Q0600  . darbepoetin (ARANESP) injection - DIALYSIS  100 mcg Intravenous Q Sat-HD  . heparin injection (subcutaneous)  5,000 Units Subcutaneous Q8H  . ipratropium-albuterol  3 mL Nebulization BID  . mouth rinse  15  mL Mouth Rinse 10 times per day  . multivitamin  1 tablet Oral QHS  . pantoprazole sodium  40 mg Per Tube Daily  . sucralfate  1 g Oral Q6H   Continuous Infusions: . sodium chloride Stopped (09/15/19 2208)  . dextrose Stopped  (08/16/19 1623)  . dextrose 50 mL/hr at 09/20/19 2001  . doxycycline (VIBRAMYCIN) IV 100 mg (09/21/19 0544)  . feeding supplement (NEPRO CARB STEADY) Stopped (09/19/19 0148)  . ferric gluconate (FERRLECIT/NULECIT) IV 125 mg (09/20/19 1220)   PRN Meds:.sodium chloride, acetaminophen (TYLENOL) oral liquid 160 mg/5 mL, dextrose, guaiFENesin-dextromethorphan, ondansetron (ZOFRAN) IV, pneumococcal 23 valent vaccine, polyethylene glycol, polyvinyl alcohol, Resource ThickenUp Clear, Resource ThickenUp Clear, white petrolatum  Antimicrobials: Anti-infectives (From admission, onward)   Start     Dose/Rate Route Frequency Ordered Stop   09/16/19 1700  doxycycline (VIBRAMYCIN) 100 mg in sodium chloride 0.9 % 250 mL IVPB     100 mg 125 mL/hr over 120 Minutes Intravenous Every 12 hours 09/16/19 1540     08/26/19 2200  ceFAZolin (ANCEF) IVPB 1 g/50 mL premix     1 g 100 mL/hr over 30 Minutes Intravenous Every 24 hours 08/24/19 1021 09/15/19 2202   08/24/19 1100  meropenem (MERREM) 1 g in sodium chloride 0.9 % 100 mL IVPB     1 g 200 mL/hr over 30 Minutes Intravenous Every 24 hours 08/24/19 1021 08/25/19 1156   08/23/19 1100  ceFAZolin (ANCEF) IVPB 2g/100 mL premix  Status:  Discontinued     2 g 200 mL/hr over 30 Minutes Intravenous Every 12 hours 08/23/19 1048 08/24/19 1021   08/21/19 1700  vancomycin (VANCOCIN) IVPB 750 mg/150 ml premix  Status:  Discontinued     750 mg 150 mL/hr over 60 Minutes Intravenous Every 24 hours 08/21/19 0843 08/23/19 1048   08/21/19 1400  vancomycin (VANCOCIN) IVPB 750 mg/150 ml premix  Status:  Discontinued     750 mg 150 mL/hr over 60 Minutes Intravenous Every 24 hours 08/20/19 1146 08/20/19 1218   08/21/19 1000  meropenem (MERREM) 1 g in sodium chloride 0.9 % 100 mL IVPB  Status:  Discontinued     1 g 200 mL/hr over 30 Minutes Intravenous Every 12 hours 08/20/19 1535 08/23/19 1048   08/21/19 0413  meropenem (MERREM) 500 mg in sodium chloride 0.9 % 100 mL IVPB   Status:  Discontinued     500 mg 200 mL/hr over 30 Minutes Intravenous Every 24 hours 08/20/19 1245 08/20/19 1535   08/20/19 1330  vancomycin (VANCOCIN) 1,750 mg in sodium chloride 0.9 % 500 mL IVPB     1,750 mg 250 mL/hr over 120 Minutes Intravenous  Once 08/20/19 1222 08/20/19 1619   08/20/19 1300  vancomycin (VANCOCIN) 1,250 mg in sodium chloride 0.9 % 250 mL IVPB  Status:  Discontinued     1,250 mg 166.7 mL/hr over 90 Minutes Intravenous  Once 08/20/19 1146 08/20/19 1218   08/20/19 1224  vancomycin variable dose per unstable renal function (pharmacist dosing)  Status:  Discontinued      Does not apply See admin instructions 08/20/19 1225 08/21/19 1152   08/15/19 1600  meropenem (MERREM) 1 g in sodium chloride 0.9 % 100 mL IVPB  Status:  Discontinued     1 g 200 mL/hr over 30 Minutes Intravenous Every 12 hours 08/15/19 1543 08/20/19 1245   08/15/19 1000  voriconazole (VFEND) tablet 200 mg  Status:  Discontinued     200 mg Per Tube Every 12 hours 08/14/19 1148  08/15/19 1134   08/14/19 1600  anidulafungin (ERAXIS) 100 mg in sodium chloride 0.9 % 100 mL IVPB  Status:  Discontinued     100 mg 78 mL/hr over 100 Minutes Intravenous Every 24 hours 08/13/19 1645 08/14/19 1148   08/14/19 1300  voriconazole (VFEND) tablet 400 mg     400 mg Per Tube Every 12 hours 08/14/19 1148 08/14/19 2217   08/13/19 1700  anidulafungin (ERAXIS) 200 mg in sodium chloride 0.9 % 200 mL IVPB     200 mg 78 mL/hr over 200 Minutes Intravenous  Once 08/13/19 1645 08/13/19 2030   08/13/19 0200  ceFAZolin (ANCEF) IVPB 2g/100 mL premix  Status:  Discontinued     2 g 200 mL/hr over 30 Minutes Intravenous Every 12 hours 08/11/19 1512 08/15/19 1543   08/12/19 0200  ceFEPIme (MAXIPIME) 2 g in sodium chloride 0.9 % 100 mL IVPB     2 g 200 mL/hr over 30 Minutes Intravenous Every 12 hours 08/11/19 1509 08/12/19 1341   08/07/19 2200  ceFEPIme (MAXIPIME) 2 g in sodium chloride 0.9 % 100 mL IVPB  Status:  Discontinued     2 g  200 mL/hr over 30 Minutes Intravenous Every 12 hours 08/07/19 1540 08/11/19 1509   08/07/19 1000  anidulafungin (ERAXIS) 100 mg in sodium chloride 0.9 % 100 mL IVPB  Status:  Discontinued     100 mg 78 mL/hr over 100 Minutes Intravenous Every 24 hours 08/06/19 0934 08/08/19 1203   08/07/19 0800  ceFEPIme (MAXIPIME) 2 g in sodium chloride 0.9 % 100 mL IVPB  Status:  Discontinued     2 g 200 mL/hr over 30 Minutes Intravenous Every 24 hours 08/06/19 1036 08/07/19 1540   08/06/19 1000  anidulafungin (ERAXIS) 200 mg in sodium chloride 0.9 % 200 mL IVPB    Note to Pharmacy: please   200 mg 78 mL/hr over 200 Minutes Intravenous Every 24 hours 08/06/19 0925 08/06/19 1600   08/06/19 0800  ceFEPIme (MAXIPIME) 2 g in sodium chloride 0.9 % 100 mL IVPB  Status:  Discontinued     2 g 200 mL/hr over 30 Minutes Intravenous Every 12 hours 08/06/19 0733 08/06/19 1036   08/02/19 2200  ceFAZolin (ANCEF) IVPB 2g/100 mL premix  Status:  Discontinued     2 g 200 mL/hr over 30 Minutes Intravenous Every 8 hours 08/02/19 1338 08/06/19 0733   08/02/19 1400  vancomycin (VANCOCIN) 1,250 mg in sodium chloride 0.9 % 250 mL IVPB  Status:  Discontinued     1,250 mg 166.7 mL/hr over 90 Minutes Intravenous Every 24 hours 08/01/19 1320 08/02/19 1332   08/01/19 1330  vancomycin (VANCOCIN) 2,000 mg in sodium chloride 0.9 % 500 mL IVPB     2,000 mg 250 mL/hr over 120 Minutes Intravenous  Once 08/01/19 1158 08/01/19 1636   08/01/19 1300  ceFEPIme (MAXIPIME) 2 g in sodium chloride 0.9 % 100 mL IVPB  Status:  Discontinued     2 g 200 mL/hr over 30 Minutes Intravenous Every 12 hours 08/01/19 1158 08/02/19 1332   07/26/19 1600  remdesivir 100 mg in sodium chloride 0.9 % 250 mL IVPB     100 mg 500 mL/hr over 30 Minutes Intravenous Every 24 hours 07/26/19 0132 07/29/19 1823   07/26/19 0215  remdesivir 200 mg in sodium chloride 0.9 % 250 mL IVPB     200 mg 500 mL/hr over 30 Minutes Intravenous Once 07/26/19 0132 07/26/19 0520        I have  personally reviewed the following labs and images: CBC: Recent Labs  Lab 09/18/19 1411 09/19/19 1548 09/21/19 0551  WBC 12.6* 11.5* 10.8*  NEUTROABS  --  7.8*  --   HGB 7.3* 8.1* 7.3*  HCT 24.1* 25.9* 23.4*  MCV 94.5 93.2 92.9  PLT 311 359 351   BMP &GFR Recent Labs  Lab 09/17/19 1115 09/18/19 1411 09/19/19 1548 09/21/19 0551  NA 134* 134* 132* 133*  K 3.8 4.1 4.4 4.0  CL 97* 97* 95* 97*  CO2 _0 GLUCOSE 128* 105* 96 93  BUN 38* 31* 44* 25*  CREATININE 4.25* 3.52* 4.95* 3.79*  CALCIUM 8.6* 8.6* 8.8* 8.7*  PHOS 2.3* 2.2* 3.0 3.0   Estimated Creatinine Clearance: 19.8 mL/min (A) (by C-G formula based on SCr of 3.79 mg/dL (H)). Liver & Pancreas: Recent Labs  Lab 09/17/19 1115 09/18/19 1411 09/19/19 1548 09/21/19 0551  ALBUMIN 1.9* 1.8* 1.9* 1.7*   No results for input(s): LIPASE, AMYLASE in the last 168 hours. No results for input(s): AMMONIA in the last 168 hours. Diabetic: No results for input(s): HGBA1C in the last 72 hours. Recent Labs  Lab 09/20/19 1955 09/20/19 2357 09/21/19 0404 09/21/19 0741 09/21/19 1147  GLUCAP 97 90 88 91 94   Cardiac Enzymes: No results for input(s): CKTOTAL, CKMB, CKMBINDEX, TROPONINI in the last 168 hours. No results for input(s): PROBNP in the last 8760 hours. Coagulation Profile: No results for input(s): INR, PROTIME in the last 168 hours. Thyroid Function Tests: No results for input(s): TSH, T4TOTAL, FREET4, T3FREE, THYROIDAB in the last 72 hours. Lipid Profile: No results for input(s): CHOL, HDL, LDLCALC, TRIG, CHOLHDL, LDLDIRECT in the last 72 hours. Anemia Panel: Recent Labs    09/21/19 0838  VITAMINB12 1,022*  FOLATE 22.3  FERRITIN 1,292*  TIBC 136*  IRON 23*  RETICCTPCT 3.3*   Urine analysis:    Component Value Date/Time   COLORURINE YELLOW 08/01/2019 1745   APPEARANCEUR HAZY (A) 08/01/2019 1745   LABSPEC 1.023 08/01/2019 1745   PHURINE 5.0 08/01/2019 1745   GLUCOSEU NEGATIVE  08/01/2019 1745   HGBUR NEGATIVE 08/01/2019 1745   BILIRUBINUR NEGATIVE 08/01/2019 1745   KETONESUR NEGATIVE 08/01/2019 1745   PROTEINUR NEGATIVE 08/01/2019 1745   NITRITE NEGATIVE 08/01/2019 1745   LEUKOCYTESUR NEGATIVE 08/01/2019 1745   Sepsis Labs: Invalid input(s): PROCALCITONIN, [place]  Microbiology: No results found for this or any previous visit (from the past 240 hour(s)).  Radiology Studies: No results found.  35 minutes with more than 50% spent in reviewing records, counseling patient/family and coordinating care.  Giovonnie Trettel T. [place]  If 7PM-7AM, please contact night-coverage [email or web address] Password [facility] 09/21/2019, 11:54 AM

## 2019-09-21 NOTE — Progress Notes (Addendum)
[facility] Progress Note   Subjective:   Patient seen in room. Tired today. Wife reports increased urine output overnight. Pt denies SOB, orthopnea, CP, palpitations, abdominal pain, N/V/D.   Objective Vitals:   09/21/19 0335 09/21/19 0905 09/21/19 0910 09/21/19 0920  BP: (!) 147/89   (!) 141/87  Pulse: (!) 102 (!) 114  (!) 101  Resp: 16 (!) 22  20  Temp: 98.7 F (37.1 C)   98.6 F (37 C)  TempSrc: Oral   Oral  SpO2: 100% 97% 94% 96%  Weight:      Height:       Physical Exam General:Chronically ill appearing male, appears comfortable, in NAD. Heart:RRR, no murmurs, rubs or gallops. Lungs:Trach in place.Auscultated anteriorly, decreased breath soundsbut no wheezing, rhonchi or rales auscultated. Abdomen:Soft, non-distended, +BS Extremities:trace edema b/l lower extremities Dialysis Access:L IJ [facility] currently accessed  Additional Objective Labs: Basic Metabolic Panel: Recent Labs  Lab 09/18/19 1411 09/19/19 1548 09/21/19 0551  NA 134* 132* 133*  K 4.1 4.4 4.0  CL 97* 95* 97*  CO2 27 26 24   GLUCOSE 105* 96 93  BUN 31* 44* 25*  CREATININE 3.52* 4.95* 3.79*  CALCIUM 8.6* 8.8* 8.7*  PHOS 2.2* 3.0 3.0   Liver Function Tests: Recent Labs  Lab 09/18/19 1411 09/19/19 1548 09/21/19 0551  ALBUMIN 1.8* 1.9* 1.7*   No results for input(s): LIPASE, AMYLASE in the last 168 hours. CBC: Recent Labs  Lab 09/18/19 1411 09/19/19 1548 09/21/19 0551  WBC 12.6* 11.5* 10.8*  NEUTROABS  --  7.8*  --   HGB 7.3* 8.1* 7.3*  HCT 24.1* 25.9* 23.4*  MCV 94.5 93.2 92.9  PLT 311 359 351   Blood Culture    Component Value Date/Time   SDES  08/20/2019 1254    TRACHEAL ASPIRATE Performed at [facility], [place] [address], [place], [place] [address]    SPECREQUEST  08/20/2019 1254    NONE Performed at [facility], [place] [address], [place], [place] [address]    CULT RARE PSEUDOMONAS AERUGINOSA 08/20/2019 1254   REPTSTATUS 08/23/2019 FINAL 08/20/2019 1254    CBG: Recent Labs  Lab 09/20/19 1955 09/20/19 2357 09/21/19 0404 09/21/19 0741 09/21/19 1147  GLUCAP 97 90 88 91 94   Iron Studies:  Recent Labs    09/21/19 0838  IRON 23*  TIBC 136*  FERRITIN 1,292*   Medications: . sodium chloride Stopped (09/15/19 2208)  . dextrose Stopped (08/16/19 1623)  . dextrose 50 mL/hr at 09/20/19 2001  . doxycycline (VIBRAMYCIN) IV 100 mg (09/21/19 0544)  . feeding supplement (NEPRO CARB STEADY) Stopped (09/19/19 0148)  . ferric gluconate (FERRLECIT/NULECIT) IV 125 mg (09/20/19 1220)   . aspirin EC  81 mg Oral Daily  . atorvastatin  40 mg Oral q1800  . budesonide (PULMICORT) nebulizer solution  0.5 mg Nebulization BID  . chlorhexidine  15 mL Mouth/Throat BID  . Chlorhexidine Gluconate Cloth  6 each Topical Q0600  . Chlorhexidine Gluconate Cloth  6 each Topical Q0600  . darbepoetin (ARANESP) injection - DIALYSIS  100 mcg Intravenous Q Sat-HD  . heparin injection (subcutaneous)  5,000 Units Subcutaneous Q8H  . ipratropium-albuterol  3 mL Nebulization BID  . mouth rinse  15 mL Mouth Rinse 10 times per day  . multivitamin  1 tablet Oral QHS  . pantoprazole sodium  40 mg Per Tube Daily  . sucralfate  1 g Oral Q6H    Dialysis Orders: New start ESRD  Assessment/Plan: 1.S/p  acute hypoxemia and respiratory failure due to COVID 19: Chest tube out, still with trach. Downsizing cuff per pulm. Will continue fluid removal with HD. 2. ESRD:New start this admission, on TTS. Did have some increased urine output overnight but at this point is still dialysis dependent. Will plan for permanent access once patient is more stable. 3.HTN/volume:BP improved on HD. Continue to remove volume with HD as tolerated, UFG 3-3.5kg per treatment. Volume/ wt's coming down nicely and BP's stable, cont UF 3-3.5 until reach new dry wt.  4. Anemia:Hgb 8.6 on 11/22>> 7.3 today. GI bleed this admission. No heparin with HD.  On aranesp.receiving iron.Transfuse PRN. 5. Secondary hyperparathyroidism:Corrected calcium 10.5. Not on calcium or vit D, use low Ca bath. Phos 2.1 >2.2 >3.0, not on binder, will trend and repleteas needed.Check iPTH with next HD. 6.CVA:Bilatcerebral cortex and right cerebellum small acute to subacute infarctswithNeuromuscularweakness/encephalopathy.See disposition 6. Dispo-Cannot do OP HD with trachand will need to tolerate travel/4 hours in chair.Very debilitated- cannot turn in bed, cant sit up for OP HD - being told that LTAC is not appropriate due to trach but maybe due to this issue of debility- not able to do OP HD or get AVF at this time.    Anice Paganini, PA-C 09/21/2019, 1:04 PM  [facility] Pager: [phone]  Pt seen, examined and agree w assess/plan as above with additions as indicated.  [facility] 09/21/2019, 1:26 PM

## 2019-09-22 ENCOUNTER — Inpatient Hospital Stay (HOSPITAL_COMMUNITY): Payer: Medicare HMO

## 2019-09-22 DIAGNOSIS — J9601 Acute respiratory failure with hypoxia: Secondary | ICD-10-CM | POA: Diagnosis not present

## 2019-09-22 LAB — CBC
HCT: 22.8 % — ABNORMAL LOW (ref 39.0–52.0)
Hemoglobin: 7.3 g/dL — ABNORMAL LOW (ref 13.0–17.0)
MCH: 28.7 pg (ref 26.0–34.0)
MCHC: 32 g/dL (ref 30.0–36.0)
MCV: 89.8 fL (ref 80.0–100.0)
Platelets: 352 10*3/uL (ref 150–400)
RBC: 2.54 MIL/uL — ABNORMAL LOW (ref 4.22–5.81)
RDW: 14.4 % (ref 11.5–15.5)
WBC: 13.1 10*3/uL — ABNORMAL HIGH (ref 4.0–10.5)
nRBC: 0 % (ref 0.0–0.2)

## 2019-09-22 LAB — RENAL FUNCTION PANEL
Albumin: 1.7 g/dL — ABNORMAL LOW (ref 3.5–5.0)
Anion gap: 10 (ref 5–15)
BUN: 30 mg/dL — ABNORMAL HIGH (ref 8–23)
CO2: 26 mmol/L (ref 22–32)
Calcium: 8.8 mg/dL — ABNORMAL LOW (ref 8.9–10.3)
Chloride: 94 mmol/L — ABNORMAL LOW (ref 98–111)
Creatinine, Ser: 5.04 mg/dL — ABNORMAL HIGH (ref 0.61–1.24)
GFR calc Af Amer: 13 mL/min — ABNORMAL LOW (ref >60)
GFR calc non Af Amer: 11 mL/min — ABNORMAL LOW (ref >60)
Glucose, Bld: 87 mg/dL (ref 70–99)
Phosphorus: 4.1 mg/dL (ref 2.5–4.6)
Potassium: 4.2 mmol/L (ref 3.5–5.1)
Sodium: 130 mmol/L — ABNORMAL LOW (ref 135–145)

## 2019-09-22 LAB — GLUCOSE, CAPILLARY
Glucose-Capillary: 81 mg/dL (ref 70–99)
Glucose-Capillary: 82 mg/dL (ref 70–99)
Glucose-Capillary: 86 mg/dL (ref 70–99)
Glucose-Capillary: 88 mg/dL (ref 70–99)
Glucose-Capillary: 90 mg/dL (ref 70–99)
Glucose-Capillary: 90 mg/dL (ref 70–99)

## 2019-09-22 LAB — MAGNESIUM: Magnesium: 1.6 mg/dL — ABNORMAL LOW (ref 1.7–2.4)

## 2019-09-22 LAB — PREPARE RBC (CROSSMATCH)

## 2019-09-22 MED ORDER — CHLORHEXIDINE GLUCONATE CLOTH 2 % EX PADS
6.0000 | MEDICATED_PAD | Freq: Every day | CUTANEOUS | Status: DC
  Administered 2019-09-23 – 2019-09-24 (×2): 6 via TOPICAL

## 2019-09-22 MED ORDER — SODIUM CHLORIDE 0.9% IV SOLUTION
Freq: Once | INTRAVENOUS | Status: AC
  Administered 2019-09-22: 11:00:00 via INTRAVENOUS

## 2019-09-22 MED ORDER — IPRATROPIUM-ALBUTEROL 0.5-2.5 (3) MG/3ML IN SOLN: 3.0000 mL | RESPIRATORY_TRACT | Status: DC | PRN

## 2019-09-22 MED ORDER — MAGNESIUM SULFATE 2 GM/50ML IV SOLN
2.0000 g | Freq: Once | INTRAVENOUS | Status: AC
  Administered 2019-09-22: 2 g via INTRAVENOUS
  Filled 2019-09-22: qty 50

## 2019-09-22 NOTE — Progress Notes (Signed)
Physical Therapy Treatment Patient Details Name: [name] MRN: [id number] DOB: 1951/07/03 Today's Date: 09/22/2019    History of Present Illness 68 y/o male diagnosed with COVID on 9/29 presented to the [facility] ED on 10/2 with dyspnea, hypoxemia requiring intubation.  Admitted to [facility], developed AKI requiring CRRT, pneumothorax requiring chest tube and had a large bronchopleural fistula treated for 10 days with a bronchial blocker.  Tracheostomy placed on 10/30. Tested COVID neg x2 on 11/2. MRI on 11/4 showed There is cortical restricted diffusion posteriorly in the right frontal lobe consistent with acute infarct. Additional smaller regions of diffusion abnormality are present in the posterior left frontal lobe, anterior left frontal lobe, and right greater than left medial parietal lobes as well as right cerebellum.     PT Comments    When PT and OT arrived, pt was being transferred to a new bed with better skin protection.  PT and OT assisted nursing to move pt, then sat him up on side of bed with LUE propped on pillows.  Pt worked on core control, then began to add there ex to [employer].  CT tech transport arrived and repositioned for comfort back to bed.  Will continue on with all mobility and balance skills as tolerated, and focus on recovery of LE strength esp in upright posture.   Follow Up Recommendations  CIR     Equipment Recommendations  Wheelchair (measurements PT);Wheelchair cushion (measurements PT);Hospital bed;Other (comment)    Recommendations for Other Services Rehab consult     Precautions / Restrictions Precautions Precautions: Fall Precaution Comments: skin breakdown on heels and joints, has transfusion going on during therapy Required Braces or Orthoses: Other Brace Other Brace: bil prevalon boots Restrictions Weight Bearing Restrictions: No Other Position/Activity Restrictions: elevate LUE when not exercising    Mobility  Bed Mobility Overal bed mobility: Needs  Assistance Bed Mobility: Supine to Sit;Sit to Supine Rolling: Max assist Sidelying to sit: +2 for physical assistance;+2 for safety/equipment;Max assist Supine to sit: Max assist;+2 for physical assistance;+2 for safety/equipment Sit to supine: Max assist;+2 for physical assistance;+2 for safety/equipment Sit to sidelying: Max assist;+2 for physical assistance;+2 for safety/equipment General bed mobility comments: 2 max to pull up in bed  Transfers Overall transfer level: Needs assistance               General transfer comment: not done as CT transport arrived  Ambulation/Gait                 Stairs             Wheelchair Mobility    Modified Rankin (Stroke Patients Only)       Balance Overall balance assessment: Needs assistance Sitting-balance support: Feet supported;Bilateral upper extremity supported Sitting balance-Leahy Scale: Poor Sitting balance - Comments: Pt initially needing mod A progressing to min guard Postural control: Left lateral lean                                  Cognition Arousal/Alertness: Awake/alert Behavior During Therapy: Flat affect Overall Cognitive Status: Impaired/Different from baseline Area of Impairment: Awareness;Safety/judgement;Following commands                   Current Attention Level: Selective   Following Commands: Follows one step commands inconsistently;Follows one step commands with increased time Safety/Judgement: Decreased awareness of safety Awareness: Intellectual Problem Solving: Slow processing;Requires verbal cues General Comments: pt requires support of L  shoulder with pillows in sitting to avoid subluxation      Exercises General Exercises - Lower Extremity Long Arc Quad: AAROM;10 reps Heel Slides: AAROM;10 reps    General Comments General comments (skin integrity, edema, etc.): pt is able to manage fair balance with LUE propped but finally fatigued sitting and  required mod assist to sit       Pertinent Vitals/Pain Pain Assessment: Faces Faces Pain Scale: Hurts little more Pain Location: L knee with AAROM Pain Descriptors / Indicators: Grimacing Pain Intervention(s): Limited activity within patient's tolerance;Monitored during session;Repositioned    Home Living                      Prior Function            PT Goals (current goals can now be found in the care plan section) Acute Rehab PT Goals Patient Stated Goal: none stated Progress towards PT goals: Progressing toward goals    Frequency    Min 3X/week      PT Plan Current plan remains appropriate    Co-evaluation PT/OT/SLP Co-Evaluation/Treatment: Yes Reason for Co-Treatment: Complexity of the patient's impairments (multi-system involvement);For patient/therapist safety;To address functional/ADL transfers PT goals addressed during session: Mobility/safety with mobility;Balance;Strengthening/ROM OT goals addressed during session: ADL's and self-care(functional mobility)      AM-PAC PT "6 Clicks" Mobility   Outcome Measure  Help needed turning from your back to your side while in a flat bed without using bedrails?: Total Help needed moving from lying on your back to sitting on the side of a flat bed without using bedrails?: Total Help needed moving to and from a bed to a chair (including a wheelchair)?: Total Help needed standing up from a chair using your arms (e.g., wheelchair or bedside chair)?: Total Help needed to walk in hospital room?: Total Help needed climbing 3-5 steps with a railing? : Total 6 Click Score: 6    End of Session Equipment Utilized During Treatment: Oxygen Activity Tolerance: Patient tolerated treatment well Patient left: in bed;with call bell/phone within reach;with bed alarm set;with family/visitor present Nurse Communication: Mobility status PT Visit Diagnosis: Unsteadiness on feet (R26.81);Other abnormalities of gait and mobility  (R26.89);Muscle weakness (generalized) (M62.81);Difficulty in walking, not elsewhere classified (R26.2)     Time: 5993-5701 PT Time Calculation (min) (ACUTE ONLY): 24 min  Charges:  $Therapeutic Exercise: 8-22 mins                    Ramond Dial 09/22/2019, 4:18 PM   Mee Hives, PT MS Acute Rehab Dept. Number: [place] and [place]

## 2019-09-22 NOTE — Consult Note (Signed)
[facility] for Infectious Disease  Total days of antibiotics since 10/9!!! (day #52,)        Reason for Consult: fever, post acute covid phase   Referring Physician: gonfar  Principal Problem:   Pneumonia due to COVID-19 virus Active Problems:   BPH (benign prostatic hyperplasia)   Acute respiratory failure due to COVID-19 ([place])   AKI (acute kidney injury) ([place])   Fluid overload   Pneumothorax on right   Acute renal failure (ARF) (H[facility])   GI bleed   Atrial fibrillation with RVR (HCC)   Constipation   Gastric ulcer with hemorrhage   Acute respiratory failure (H[facility])   Chest tube in place   Primary spontaneous pneumothorax   Acute respiratory distress syndrome (ARDS) due to COVID-19 virus ([place])   Tracheostomy status ([place])   Cerebral thrombosis with cerebral infarction   Advanced care planning/counseling discussion   Goals of care, counseling/discussion   Palliative care by specialist   Sinus tachycardia   Acute blood loss anemia   H. pylori infection   CVA (cerebral vascular accident) ([place])   Anoxic brain injury ([place])   MSSA bacteremia   Positive D dimer   Pseudomonas infection   Infection due to acinetobacter baumannii    HPI: [name] is a 68 y.o. male with hx of GERD admitted in early October for increasing SOB with diffuse multifocal pneumonia found to have covid-19. He has had prolonged respiratory failure requiring intubation, c/b pneumothorax then s/p tracheostomy. Developed AKI where he is not receiving HD. He is much improved from his initial severe sepsis/critical illness from covid. He has been treated with numerous course of abtx for pneumonia, now day #52. In the last 2 days having low grade fever of unclear source. Patient has been working with PT says his wife. RT reports that he is getting decannulated. Not much of productive cough. No recent infectious work up  Past Medical History:  Diagnosis Date  . COVID-19   . Heartburn     Allergies: No  Known Allergies   MEDICATIONS: . aspirin EC  81 mg Oral Daily  . atorvastatin  40 mg Oral q1800  . budesonide (PULMICORT) nebulizer solution  0.5 mg Nebulization BID  . chlorhexidine  15 mL Mouth/Throat BID  . Chlorhexidine Gluconate Cloth  6 each Topical Q0600  . darbepoetin (ARANESP) injection - DIALYSIS  100 mcg Intravenous Q Sat-HD  . heparin injection (subcutaneous)  5,000 Units Subcutaneous Q8H  . ipratropium-albuterol  3 mL Nebulization BID  . mouth rinse  15 mL Mouth Rinse 10 times per day  . multivitamin  1 tablet Oral QHS  . pantoprazole sodium  40 mg Per Tube Daily  . sucralfate  1 g Oral Q6H    Social History   Tobacco Use  . Smoking status: Never Smoker  . Smokeless tobacco: Never Used  Substance Use Topics  . Alcohol use: Yes    Alcohol/week: 2.0 standard drinks    Types: 2 Shots of liquor per week    Comment: moderate  . Drug use: No    Family History  Problem Relation Age of Onset  . Colon cancer Neg Hx   . Colon polyps Neg Hx     Review of Systems  Constitutional: Negative for fever, chills, diaphoresis, activity change, appetite change, fatigue and unexpected weight change.  HENT: Negative for congestion, sore throat, rhinorrhea, sneezing, trouble swallowing and sinus pressure.  Eyes: Negative for photophobia and visual disturbance.  Respiratory: Negative for cough,  chest tightness, shortness of breath, wheezing and stridor.  Cardiovascular: Negative for chest pain, palpitations and leg swelling.  Gastrointestinal: Negative for nausea, vomiting, abdominal pain, diarrhea, constipation, blood in stool, abdominal distention and anal bleeding.  Genitourinary: Negative for dysuria, hematuria, flank pain and difficulty urinating.  Musculoskeletal: +weakness from critical illness Skin: Negative for color change, pallor, rash and wound.  Neurological: Negative for dizziness, tremors, weakness and light-headedness.  Hematological: Negative for adenopathy. Does  not bruise/bleed easily.  Psychiatric/Behavioral: Negative for behavioral problems, confusion, sleep disturbance, dysphoric mood, decreased concentration and agitation.      OBJECTIVE: Temp:  [97.8 F (36.6 C)-101 F (38.3 C)] 99.4 F (37.4 C) (11/30 1647) Pulse Rate:  [10-112] 108 (11/30 1647) Resp:  [16-20] 20 (11/30 1647) BP: (132-153)/(79-93) 132/93 (11/30 1647) SpO2:  [94 %-100 %] 100 % (11/30 1647) FiO2 (%):  [16 %] 16 % (11/29 2012) Physical Exam  Constitutional: He is oriented to person, place, and time. He appears well-developed and well-nourished. No distress.  HENT: trach in place, capped. Mouth/Throat: Oropharynx is clear and moist. No oropharyngeal exudate.  Cardiovascular: Normal rate, regular rhythm and normal heart sounds. Exam reveals no gallop and no friction rub.  No murmur heard.  Pulmonary/Chest: Effort normal and breath sounds normal. No respiratory distress. He has no wheezes.  Abdominal: Soft. Bowel sounds are normal. He exhibits no distension. There is no tenderness.  Chest wall - hd catheter Ext = muscle wasting Neurological: He is alert and oriented to person, place, and time.  Skin: Skin is warm and dry. No rash noted. No erythema.  Psychiatric: He has a normal mood and affect. His behavior is normal.     LABS: Results for orders placed or performed during the hospital encounter of 07/25/19 (from the past 48 hour(s))  Glucose, capillary     Status: None   Collection Time: 09/20/19  7:55 PM  Result Value Ref Range   Glucose-Capillary 97 70 - 99 mg/dL  Glucose, capillary     Status: None   Collection Time: 09/20/19 11:57 PM  Result Value Ref Range   Glucose-Capillary 90 70 - 99 mg/dL  Glucose, capillary     Status: None   Collection Time: 09/21/19  4:04 AM  Result Value Ref Range   Glucose-Capillary 88 70 - 99 mg/dL  Renal function panel     Status: Abnormal   Collection Time: 09/21/19  5:51 AM  Result Value Ref Range   Sodium 133 (L) 135 -  145 mmol/L   Potassium 4.0 3.5 - 5.1 mmol/L   Chloride 97 (L) 98 - 111 mmol/L   CO2 24 22 - 32 mmol/L   Glucose, Bld 93 70 - 99 mg/dL   BUN 25 (H) 8 - 23 mg/dL   Creatinine, Ser 3.79 (H) 0.61 - 1.24 mg/dL   Calcium 8.7 (L) 8.9 - 10.3 mg/dL   Phosphorus 3.0 2.5 - 4.6 mg/dL   Albumin 1.7 (L) 3.5 - 5.0 g/dL   GFR calc non Af Amer 16 (L) >60 mL/min   GFR calc Af Amer 18 (L) >60 mL/min   Anion gap 12 5 - 15    Comment: Performed at [facility], [address], [place] [address]  CBC     Status: Abnormal   Collection Time: 09/21/19  5:51 AM  Result Value Ref Range   WBC 10.8 (H) 4.0 - 10.5 K/uL   RBC 2.52 (L) 4.22 - 5.81 MIL/uL   Hemoglobin 7.3 (L) 13.0 -  17.0 g/dL   HCT 23.4 (L) 39.0 - 52.0 %   MCV 92.9 80.0 - 100.0 fL   MCH 29.0 26.0 - 34.0 pg   MCHC 31.2 30.0 - 36.0 g/dL   RDW 14.6 11.5 - 15.5 %   Platelets 351 150 - 400 K/uL   nRBC 0.0 0.0 - 0.2 %    Comment: Performed at [facility], [place] [address], [place], [place] [address]  Glucose, capillary     Status: None   Collection Time: 09/21/19  7:41 AM  Result Value Ref Range   Glucose-Capillary 91 70 - 99 mg/dL  Vitamin B12     Status: Abnormal   Collection Time: 09/21/19  8:38 AM  Result Value Ref Range   Vitamin B-12 1,022 (H) 180 - 914 pg/mL    Comment: (NOTE) This assay is not validated for testing neonatal or myeloproliferative syndrome specimens for Vitamin B12 levels. Performed at [facility], [place] [address], [place], [place] [address]   Iron and TIBC     Status: Abnormal   Collection Time: 09/21/19  8:38 AM  Result Value Ref Range   Iron 23 (L) 45 - 182 ug/dL   TIBC 136 (L) 250 - 450 ug/dL   Saturation Ratios 17 (L) 17.9 - 39.5 %   UIBC 113 ug/dL    Comment: Performed at [facility], [place] [address], [place], [place] [address]  Ferritin     Status: Abnormal   Collection Time: 09/21/19  8:38 AM  Result Value Ref Range   Ferritin 1,292 (H) 24 - 336 ng/mL    Comment: Performed  at [facility], [place] [address] [place], [place] [address]  Reticulocytes     Status: Abnormal   Collection Time: 09/21/19  8:38 AM  Result Value Ref Range   Retic Ct Pct 3.3 (H) 0.4 - 3.1 %   RBC. 2.68 (L) 4.22 - 5.81 MIL/uL   Retic Count, Absolute 89.0 19.0 - 186.0 K/uL   Immature Retic Fract 34.7 (H) 2.3 - 15.9 %    Comment: Performed at [facility] [address], [place], [place] [address]  Folate     Status: None   Collection Time: 09/21/19  8:38 AM  Result Value Ref Range   Folate 22.3 >5.9 ng/mL    Comment: Performed at [facility] [address], [place], [place] [address]  Glucose, capillary     Status: None   Collection Time: 09/21/19 11:47 AM  Result Value Ref Range   Glucose-Capillary 94 70 - 99 mg/dL  Glucose, capillary     Status: None   Collection Time: 09/21/19  4:42 PM  Result Value Ref Range   Glucose-Capillary 89 70 - 99 mg/dL  Glucose, capillary     Status: None   Collection Time: 09/21/19  8:12 PM  Result Value Ref Range   Glucose-Capillary 92 70 - 99 mg/dL  Glucose, capillary     Status: None   Collection Time: 09/22/19 12:16 AM  Result Value Ref Range   Glucose-Capillary 86 70 - 99 mg/dL  Renal p 5 am     Status: Abnormal   Collection Time: 09/22/19  4:01 AM  Result Value Ref Range   Sodium 130 (L) 135 - 145 mmol/L   Potassium 4.2 3.5 - 5.1 mmol/L   Chloride 94 (L) 98 - 111 mmol/L   CO2 26 22 - 32 mmol/L   Glucose, Bld 87 70 -  99 mg/dL   BUN 30 (H) 8 - 23 mg/dL   Creatinine, Ser 5.04 (H) 0.61 - 1.24 mg/dL   Calcium 8.8 (L) 8.9 - 10.3 mg/dL   Phosphorus 4.1 2.5 - 4.6 mg/dL   Albumin 1.7 (L) 3.5 - 5.0 g/dL   GFR calc non Af Amer 11 (L) >60 mL/min   GFR calc Af Amer 13 (L) >60 mL/min   Anion gap 10 5 - 15    Comment: Performed at [facility] [address] [place], [place] [address]  CBC-5am     Status: Abnormal   Collection Time: 09/22/19  4:01 AM  Result Value Ref Range   WBC 13.1 (H) 4.0 - 10.5 K/uL   RBC 2.54  (L) 4.22 - 5.81 MIL/uL   Hemoglobin 7.3 (L) 13.0 - 17.0 g/dL   HCT 22.8 (L) 39.0 - 52.0 %   MCV 89.8 80.0 - 100.0 fL   MCH 28.7 26.0 - 34.0 pg   MCHC 32.0 30.0 - 36.0 g/dL   RDW 14.4 11.5 - 15.5 %   Platelets 352 150 - 400 K/uL   nRBC 0.0 0.0 - 0.2 %    Comment: Performed at [facility], [place] [address], [place], [place] [address]  Magnesium     Status: Abnormal   Collection Time: 09/22/19  4:01 AM  Result Value Ref Range   Magnesium 1.6 (L) 1.7 - 2.4 mg/dL    Comment: Performed at [facility] [address] [place], [place] [address]  Glucose, capillary     Status: None   Collection Time: 09/22/19  7:10 AM  Result Value Ref Range   Glucose-Capillary 90 70 - 99 mg/dL  Type and screen [place]     Status: None (Preliminary result)   Collection Time: 09/22/19  7:55 AM  Result Value Ref Range   ABO/RH(D) O POS    Antibody Screen NEG    Sample Expiration 09/25/2019,2359    Unit Number [id number]    Blood Component Type RED CELLS,LR    Unit division 00    Status of Unit ISSUED    Transfusion Status OK TO TRANSFUSE    Crossmatch Result      Compatible Performed at [facility], [place] [address] [place], [place] [address]   Prepare RBC     Status: None   Collection Time: 09/22/19  7:55 AM  Result Value Ref Range   Order Confirmation      ORDER PROCESSED BY BLOOD BANK Performed at [facility], [place] [address] [place], [place] [address]   Glucose, capillary     Status: None   Collection Time: 09/22/19 11:35 AM  Result Value Ref Range   Glucose-Capillary 82 70 - 99 mg/dL  Glucose, capillary     Status: None   Collection Time: 09/22/19  4:45 PM  Result Value Ref Range   Glucose-Capillary 81 70 - 99 mg/dL    MICRO: Blood cx 11/30 IMAGING: No results found.  Assessment/Plan:  68yo M with critical illness from covid-19 now improving however he is roughly 50 days out from onset of illness having recent low grade fever of  unclear source   - recommend to stop all abtx, observe off abtx - check blood cx  - repeat cxr to see if any new infiltrate and observe for worsening productive coug - would check sed rate and crp, ferritin - suspect this is part of post acute covid-19 inflammatory  response-would need to rule out acute infection  Will provide further recs tomorrow

## 2019-09-22 NOTE — Progress Notes (Signed)
Nutrition Follow-up  DOCUMENTATION CODES:   Not applicable  INTERVENTION:   -Continue Magic cup TID with meals, each supplement provides 290 kcal and 9 grams of protein -Continue renal MVI daily  NUTRITION DIAGNOSIS:   Increased nutrient needs related to chronic illness as evidenced by estimated needs.  Ongoing  GOAL:   Patient will meet greater than or equal to 90% of their needs  Not meeting   MONITOR:   PO intake, Supplement acceptance, Diet advancement, Labs, Weight trends, Skin, I & O's  REASON FOR ASSESSMENT:   Consult Enteral/tube feeding initiation and management  ASSESSMENT:   68 y/o male admitted with PNA and COVID 19  11/18 Diet advanced to Dysphagia I, Honey Thick 11/21- TF d/c 11/22- advanced to dysphagia 2 diet with nectar thick liquids 11/27- downsized to size 6 cuffless trach today  Plan HD tomorrow. Decannulation today.   Cortrak clogged. RD attempted to unclog, but was unable to pass stylet through. RN to pull tube. Will do calorie count over the next few days to determine if pt needs another tube (envelope placed on door). Plan Cortrak placement Friday if intake does not progress. May need to consider long term feeding tube as pt is deconditioned. Wife made aware.  Meal completions charted as 0-50% for his last four meals.   Admission weight: 96 kg Current weight: 86.5 kg (down 8 kg over the last week)  I/O: +246 ml since 11/16 UOP: 400 ml x 24 hrs  Last HD 11/25: 300 ml net UF  Drips: D5 @ 50 ml/hr  Medications: aranesp, rena-vit Labs: Na 130 (L) Mg 1.6 (L) CBG 82-89 corrected calcium 10.6 (H)    Diet Order:   Diet Order            DIET DYS 2 Room service appropriate? Yes; Fluid consistency: Nectar Thick  Diet effective now              EDUCATION NEEDS:   No education needs have been identified at this time  Skin:  Skin Assessment: Skin Integrity Issues: Skin Integrity Issues:: Other (Comment), Incisions, Stage II DTI:  - Stage I: - Stage II: perineum, sacrum Incisions: rt lateral lower chest Other: MASD buttocks, penis, open penis sore, lt neck, lt chest  Last BM:  11/30  Height:   Ht Readings from Last 1 Encounters:  09/07/19 5\' 10"  (1.778 m)    Weight:   Wt Readings from Last 1 Encounters:  09/20/19 86.5 kg    Ideal Body Weight:  75.45 kg  BMI:  Body mass index is 27.36 kg/m.  Estimated Nutritional Needs:   Kcal:  5974-1638  Protein:  130-150 gm  Fluid:  1000 mL plus UOP   [name] RD, LDN Clinical Nutrition Pager # [phone]

## 2019-09-22 NOTE — Plan of Care (Signed)
  Problem: Respiratory: Goal: Will maintain a patent airway Outcome: Progressing   Problem: Respiratory: Goal: Complications related to the disease process, condition or treatment will be avoided or minimized Outcome: Progressing   Problem: Skin Integrity: Goal: Risk for impaired skin integrity will decrease Outcome: Progressing

## 2019-09-22 NOTE — Progress Notes (Signed)
Occupational Therapy Treatment Patient Details Name: [name] MRN: [id number] DOB: 05-01-1951 Today's Date: 09/22/2019    History of present illness 68 y/o male diagnosed with COVID on 9/29 presented to the [facility] ED on 10/2 with dyspnea, hypoxemia requiring intubation.  Admitted to [facility], developed AKI requiring CRRT, pneumothorax requiring chest tube and had a large bronchopleural fistula treated for 10 days with a bronchial blocker.  Tracheostomy placed on 10/30. Tested COVID neg x2 on 11/2. MRI on 11/4 showed There is cortical restricted diffusion posteriorly in the right frontal lobe consistent with acute infarct. Additional smaller regions of diffusion abnormality are present in the posterior left frontal lobe, anterior left frontal lobe, and right greater than left medial parietal lobes as well as right cerebellum.    OT comments  Pt making progress towards therapy goals this session and see by physical therapy for skilled co-treatment. Pt appearing very lethargic upon entering the room but agreeable to OT intervention. Therapy assisted staff with switching pt into high low air mattress bed this session. Pt rolling with max A to the R but needing max A +2 for supine <> sit. Pt sitting on EOB with mod A progressing to moments of min guard. Pt needing mod cuing for hand placement and technique for weight shift and upright posture. Pt tolerating sitting EOB for ~ 5 minutes before additional staff arriving for test/procedure. Pt required Max- total A +2 for sit >supine and repositioning in bed. Pt continues to benefit from OT intervention and continues to be appropriate for SNF at discharge.   Follow Up Recommendations  Supervision/Assistance - 24 hour;CIR    Equipment Recommendations  Other (comment)(defer to next venue of care)    Recommendations for Other Services      Precautions / Restrictions Precautions Precautions: Fall Precaution Comments: skin breakdown on joints, heels Required  Braces or Orthoses: Other Brace Other Brace: bil prevalon boots       Mobility Bed Mobility Overal bed mobility: Needs Assistance   Rolling: Max assist Sidelying to sit: Max assist;+2 for physical assistance   Sit to supine: Max assist;+2 for physical assistance Sit to sidelying: Max assist;+2 for physical assistance    Transfers      General transfer comment: not performed secondary to lethargy and procedure/testing to follow session    Balance Overall balance assessment: Needs assistance Sitting-balance support: Feet supported;Bilateral upper extremity supported Sitting balance-Leahy Scale: Poor Sitting balance - Comments: Pt initially needing mod A progressing to min guard Postural control: Left lateral lean            ADL either performed or assessed with clinical judgement        Vision Baseline Vision/History: Wears glasses Wears Glasses: At all times            Cognition Arousal/Alertness: Lethargic Behavior During Therapy: Flat affect Overall Cognitive Status: Impaired/Different from baseline Area of Impairment: Problem solving;Awareness;Safety/judgement;Following commands      Current Attention Level: Sustained   Following Commands: Follows one step commands with increased time;Follows one step commands inconsistently Safety/Judgement: Decreased awareness of safety;Decreased awareness of deficits Awareness: Intellectual Problem Solving: Slow processing;Requires verbal cues                     Pertinent Vitals/ Pain       Pain Assessment: Faces Faces Pain Scale: Hurts little more Pain Location: L shoulder, uncomfortable in bed Pain Descriptors / Indicators: Discomfort;Grimacing Pain Intervention(s): Limited activity within patient's tolerance;Monitored during session;Repositioned  Frequency  Min 2X/week        Progress Toward Goals  OT Goals(current goals can now be found in the care plan section)  Progress towards OT  goals: Progressing toward goals  Acute Rehab OT Goals Patient Stated Goal: none stated  Plan Discharge plan remains appropriate;Frequency remains appropriate    Co-evaluation    PT/OT/SLP Co-Evaluation/Treatment: Yes Reason for Co-Treatment: Complexity of the patient's impairments (multi-system involvement);Necessary to address cognition/behavior during functional activity;For patient/therapist safety;To address functional/ADL transfers   OT goals addressed during session: ADL's and self-care(functional mobility)      AM-PAC OT "6 Clicks" Daily Activity     Outcome Measure   Help from another person eating meals?: Total Help from another person taking care of personal grooming?: Total Help from another person toileting, which includes using toliet, bedpan, or urinal?: Total Help from another person bathing (including washing, rinsing, drying)?: Total Help from another person to put on and taking off regular upper body clothing?: Total Help from another person to put on and taking off regular lower body clothing?: Total 6 Click Score: 6    End of Session    OT Visit Diagnosis: Unsteadiness on feet (R26.81);Other abnormalities of gait and mobility (R26.89);Muscle weakness (generalized) (M62.81)   Activity Tolerance Patient tolerated treatment well   Patient Left in bed;with call bell/phone within reach;with bed alarm set   Nurse Communication Mobility status        Time: 9629-5284 OT Time Calculation (min): 26 min  Charges: OT General Charges $OT Visit: 1 Visit OT Treatments $Self Care/Home Management : 8-22 mins   Gypsy Decant MS, OTR/L 09/22/2019, 3:31 PM

## 2019-09-22 NOTE — Progress Notes (Signed)
Trach removed at this time per MD order.  Site clean & dry. Dressing applied Pt on 1L [place] tolerating well, no distress noted

## 2019-09-22 NOTE — Progress Notes (Signed)
[facility] Progress Note   Subjective:  Seen in room - sleepy. Nods to questions. Denies CP or dyspnea.   Objective Vitals:   09/22/19 0445 09/22/19 0552 09/22/19 0906 09/22/19 0922  BP: 134/86   139/85  Pulse: (!) 108  97 94  Resp: 16  18 18   Temp: (!) 101 F (38.3 C) 98.4 F (36.9 C)  98 F (36.7 C)  TempSrc: Oral Oral  Oral  SpO2: 99%  95% 98%  Weight:      Height:       Physical Exam General:Chronically ill appearing male, NAD. ENT: NG tube and trach in place. Heart:RRR, no murmur Lungs:CTA anteriorly Abdomen:Soft, non-distended, +BS Extremities:Trace LLE edema Dialysis Access:L [facility]  Additional Objective Labs: Basic Metabolic Panel: Recent Labs  Lab 09/19/19 1548 09/21/19 0551 09/22/19 0401  NA 132* 133* 130*  K 4.4 4.0 4.2  CL 95* 97* 94*  CO2 26 24 26   GLUCOSE 96 93 87  BUN 44* 25* 30*  CREATININE 4.95* 3.79* 5.04*  CALCIUM 8.8* 8.7* 8.8*  PHOS 3.0 3.0 4.1   Liver Function Tests: Recent Labs  Lab 09/19/19 1548 09/21/19 0551 09/22/19 0401  ALBUMIN 1.9* 1.7* 1.7*   CBC: Recent Labs  Lab 09/18/19 1411 09/19/19 1548 09/21/19 0551 09/22/19 0401  WBC 12.6* 11.5* 10.8* 13.1*  NEUTROABS  --  7.8*  --   --   HGB 7.3* 8.1* 7.3* 7.3*  HCT 24.1* 25.9* 23.4* 22.8*  MCV 94.5 93.2 92.9 89.8  PLT 311 359 351 352   CBG: Recent Labs  Lab 09/21/19 1147 09/21/19 1642 09/21/19 2012 09/22/19 0016 09/22/19 0710  GLUCAP 94 89 92 86 90   Iron Studies:  Recent Labs    09/21/19 0838  IRON 23*  TIBC 136*  FERRITIN 1,292*   Medications: . sodium chloride Stopped (09/21/19 1659)  . dextrose Stopped (08/16/19 1623)  . dextrose 50 mL/hr at 09/22/19 0700  . doxycycline (VIBRAMYCIN) IV 125 mL/hr at 09/22/19 0700  . feeding supplement (NEPRO CARB STEADY) Stopped (09/19/19 0148)  . ferric gluconate (FERRLECIT/NULECIT) IV 125 mg (09/20/19 1220)   . sodium chloride   Intravenous Once  . aspirin EC  81 mg Oral Daily  .  atorvastatin  40 mg Oral q1800  . budesonide (PULMICORT) nebulizer solution  0.5 mg Nebulization BID  . chlorhexidine  15 mL Mouth/Throat BID  . Chlorhexidine Gluconate Cloth  6 each Topical Q0600  . Chlorhexidine Gluconate Cloth  6 each Topical Q0600  . darbepoetin (ARANESP) injection - DIALYSIS  100 mcg Intravenous Q Sat-HD  . heparin injection (subcutaneous)  5,000 Units Subcutaneous Q8H  . ipratropium-albuterol  3 mL Nebulization BID  . mouth rinse  15 mL Mouth Rinse 10 times per day  . multivitamin  1 tablet Oral QHS  . pantoprazole sodium  40 mg Per Tube Daily  . sucralfate  1 g Oral Q6H    Dialysis Orders: New start ESRD -> 4hr, 3K, TDC, 400/800, EDW pending.  Assessment/Plan: 1.S/p acute hypoxemia and respiratory failure due to COVID 19: Chest tube out, still with trach. Downsizing cuff per pulm. Will continue fluid removal with HD. 2. ESRD:New start this admission, on TTS. Did have some increased urine output overnight but at this point is still dialysis dependent. Will plan for permanent access once patient is more stable.HD tomorrow. 3.HTN/volume:BPimproved on HD. Continue UF as tolerated - goal 3-3.5kg per treatment. 4. Anemia:Hgb 7.3. HxGI bleed this admission. No heparin with HD. Continue Aranesp and  IV iron. 5. Secondary hyperparathyroidism:CorrCa slightly high. Not on calcium or vit D, use low Ca bath. Phos now within goal.Check iPTH with next HD. 6.CVA:Bilatcerebral cortex and right cerebellum small acute to subacute infarctswithNeuromuscularweakness/encephalopathy.See disposition 6. Dispo-Cannot do OP HD with trachand will need to tolerate travel/4 hours in chair.Very debilitated- cannot turn in bed, cannot sit up for OP HD - being told that LTAC is not appropriate due to trach but maybe due to this issue of debility- not able to do OP HD or get AVF at this time.     Veneta Penton, PA-C 09/22/2019, 10:09 AM  [facility] Pager:  [phone]

## 2019-09-22 NOTE — Progress Notes (Signed)
NAME:  [name], MRN:  [id number], DOB:  16-Jan-1951, LOS: 54 ADMISSION DATE:  07/25/2019, CONSULTATION DATE:  10/3 REFERRING MD:  Nadara Mustard, CHIEF COMPLAINT:  Dyspnea   Brief History   68 y/o male diagnosed with COVID on 9/29 presented to the [facility] ED on 10/2 with dyspnea, hypoxemia requiring intubation.  Admitted to [facility], developed AKI requiring CRRT, pneumothorax requiring chest tube and had a large bronchopleural fistula treated for 10 days with a bronchial blocker. Tracheostomy placed on 10/30. To [facility] 11/1 for IHD. Chest tube to suction, improvement in air leak. Waterseal 11/13  Past Medical History  GERD  Significant Hospital Events   10/03 Admit to [facility] from [facility] ER, start decadron and remdesivir, given tociluzimab; prone positioning 10/04 convalescent plasma 10/05 stop prone positioning 10/06 convalescent plasma; prone positioning again 10/09 start ABx 10/10 MSSA bacteremia, CVL d/ced; ID consulted; increase OG tube outpt 10/11 vent weaning trial started 10/13 fever, pneumothorax, pig tail chest tube placed 10/14 worsening hypotension and renal fx >> consulted nephrology; worsening PTX >> replaced chest tube; GNR in sputum >> ABx changed 10/15 start CRRT; endobronchial blocker placed for persistent air leak 10/16 endobronchial blocker repositioned, changed chest tube to water seal; melana with ABLA >> GI consulted; transfuse PRBC 10/17 persistent air leak, increased WOB; start nimbex gtt >> air leak decreased; EGD; A fib with RVR >> start amiodarone 10/18 transfuse PRBC; GI s/o 10/20 resume heparin gtt 10/21 stopped nimbex, chest tube to suction, stopped heparin drip because Hgb dropped again 10/22 TPA in chest tube, repositioned endobronchial blocker 10/23 deflated endobronchial balloon 10/24 small air leak  10/25 removed bronchial blocker 10/30 tracheostomy  11/1 D/C CRRT 11/2 Transfer to [facility] 11/3 Started on IHD 11/7 trach sutures removed 11/4 present, weaning on ATC  11/11 required phenylephrine to tolerate iHD 11/13 chest tube to waterseal 11/16 Chest tube removed  11/27 Downsize trach to 6 cuffless   Consults:  ID Nephrology Gastroenterology PCCM  Procedures:  10/2 R IJ CVL >> 10/10 10/13 Rt pig tail chest tube >> 10/14 10/14 R 1F chest tube >>11/16 10/14 L IJ HD cath >> 11/10 10/30 tracheostomy >> 11/30 11/10 tunneled HD cath >>  Significant Diagnostic Tests:  10/2 CT angiogram chest >> extensive bilateral airspace disease predominantly posterior 10/8 doppler legs b/l >> no DVT 10/15 renal u/s >> normal 10/17 EGD >> non bleeding gastric ulcer 10/19 Echo >> EF 60 to 65%, hyperdynamic LV with small LVOT gradient 10/22 CT chest >> large collection of air in pleural space with fluid, pneumonia bilaterally R>L endobronchial blocker in place, chest tube anterior 10/31 CXR >> persistent small right pneumothorax despite chest in adequate position MRI brain 11/3 >> small acute to subacute infarcts involving bilateral cerebral cortex and right cerebellum.  Large b/l mastoid effusions  Micro Data:  9/20 SARS COV 2 >> POSITIVE 10/2 blood >> negative 10/9 blood >> MSSA 2/4 10/9 resp >> MSSA, Pseudomonas 10/13 blood >> negative 10/21 bronch wash bacterial >> pseduomonas, acinetobacter baumannii 10/21 bronch wash fungal >  10/21 bronch wash aspergillus antigen> negative 10/24 bronchoscopy wash> pseudomonas 10/28 Blood cx>>> negative  10/28 Sputum cx>>> Pseudomonas 11/1 SARS COV 2 > negative 11/2 SARS CoV2 >> negative   Antimicrobials:  10/3 remdesivir > 10/6 10/3 actemra 10/3 decadron > 10/12 10/4 convalescent plasma 10/6 convalescent plasma  10/9 vancomycin > 10/10 10/9 cefepime > 10/10 10/10 ancef > 10/13 10/13 cefepime >10/20 10/14 anidulofungin > 10/15  10/20 ancef > 10/23 10/23 meropenem > >>>off  10/21 anidulofungin > 10/22 10/22 voriconazole > 10/23 10/28 Merrem >11/2 10/28 Vanc >10/30  10/31 Ancef > Tentative stop  date 11/23   Interim history/subjective:  Tolerated trach capping well since around lunch time 11/29. No complaints. Strong cough.  Objective   Blood pressure 137/80, pulse 100, temperature 98.3 F (36.8 C), temperature source Oral, resp. rate 18, height 5\' 10"  (1.778 m), weight 86.5 kg, SpO2 99 %.    FiO2 (%):  [16 %] 16 %   Intake/Output Summary (Last 24 hours) at 09/22/2019 1225 Last data filed at 09/22/2019 1108 Gross per 24 hour  Intake 1784.9 ml  Output 400 ml  Net 1384.9 ml   Filed Weights   09/17/19 2033 09/18/19 2136 09/20/19 1952  Weight: 86.8 kg 86.3 kg 86.5 kg   Examination: General: Adult male, chronically ill appearing, globally weak, resting in bed, in NAD. Neuro: Awake, follows basic commands, MAE's. HEENT: [place]/AT. Sclerae anicteric. Trach capped. Cardiovascular: RRR, no M/R/G.  Lungs: Respirations even and unlabored.  CTA bilaterally, No W/R/R. Abdomen: Obese.  BS x 4, soft, NT/ND.  Musculoskeletal: No gross deformities, no edema.  Skin: Intact, warm, no rashes.   Assessment & Plan:   Acute respiratory failure with hypoxemia - 2/2 COVID-19 (s/p remdsevir, actemra, convalescent plasma, steroids)  Trach dependence - trach placed 10/30.  Capped 11/29 and tolerated well. R pneumothorax - s/p chest tube placement.  Now with persistent airleak / BPF despite endobronchial blocker placement (removed 10/25 due to MSSA bacteremia) P: Proceed with decannulation Continue supplemental O2 as needed to maintain SpO2 > 88%. If able to wean oxygen off please place on humidified air please. Continue bronchial hygiene.  Rest per primary team.  Nothing further to add.  PCCM will sign off.  Please do not hesitate to call us back if we can be of any further assistance.   Montey Hora, [facility] 09/22/2019, 12:29 PM

## 2019-09-22 NOTE — Progress Notes (Signed)
PROGRESS NOTE  [name] NFA:[id number] DOB: 1951/01/03   PCP: Kathyrn Drown, MD  Patient is from: Home  DOA: 07/25/2019 LOS: 82  Brief Narrative / Interim history: 68 year old male with PMH of GERD and BPH, initially presented on 07/25/2019 with worsening dyspnea after recently testing positive for COVID-19 on 07/22/2019.  He required emergent intubation at [facility] ED.  He was admitted to the [facility] hospital for acute hypoxic respiratory failure secondary to severe ARDS from Covid pneumonia and completed Decadron, Remdesivir and Tocilizumab.  Hospital course complicated by acute kidney injury requiring CRRT, encephalopathy, pneumothorax complicated by persistent air leak and bronchopleural fistula requiring endobronchial blocker and pleur-evac, prolonged mechanical ventilation s/p tracheostomy on 10/30, MSSA bacteremia with negative echocardiogram intermittent pressor support with dialysis, melena with acute blood loss anemia requiring transfusion, IV PPI, empiric octreotide, EGD showed gastric ulcer and nonbleeding visible vessel clipped, transferred to [facility] on 08/26/2023 intermittent HD, subacute right cerebral/cerebellar infarcts on MRI.  PCCM following for tracheostomy management, weekly.  Nephrology on board for HD needs.  VVS plans permanent dialysis access after patient improves overall.  Subjective: Spiked fever to 101 overnight.  No other major events overnight.  No complaint this morning.  Cough improving.  He denies chest pain, shortness of breath, nausea, vomiting, abdominal pain or UTI symptoms.   Objective: Vitals:   09/22/19 1130 09/22/19 1145 09/22/19 1145 09/22/19 1237  BP: 135/80 137/80 137/80   Pulse: (!) 101 (!) 10 100 (!) 106  Resp: _0 Temp: 98.3 F (36.8 C) 98.3 F (36.8 C) 98.3 F (36.8 C)   TempSrc: Oral Oral Oral   SpO2:  99%  97%  Weight:      Height:        Intake/Output Summary (Last 24 hours) at 09/22/2019 1315 Last  data filed at 09/22/2019 1108 Gross per 24 hour  Intake 1724.9 ml  Output 400 ml  Net 1324.9 ml   Filed Weights   09/17/19 2033 09/18/19 2136 09/20/19 1952  Weight: 86.8 kg 86.3 kg 86.5 kg    Examination:  GENERAL: No acute distress.  Sleepy but rises to voice easily. HEENT: MMM.  Vision and hearing grossly intact.  Cortrak in place NECK: Trach in place RESP:  No IWOB.  Fair air movement bilaterally.  Rhonchi bilaterally. CVS:  RRR. Heart sounds normal.  ABD/GI/GU: Bowel sounds present. Soft. Non tender.  MSK/EXT:  No apparent deformity or edema.  Left hemiparesis. SKIN: no apparent skin lesion or wound NEURO: Sleepy but rises to voice.  Fairly oriented.  Left hemiparesis. PSYCH: Calm. Normal affect.  Assessment & Plan: Acute hypoxic respiratory failure due to ARDS from Covid pneumonia status post trach Right pneumothorax/empyema/necrotic RLL/bronchopleural fistula with large air leak Pseudomonas and Acinetobacter in BAL: completed 10 days course of meropenem. -COVID-19 positive on 07/13/2019. S/p remdesivir, Decadron, Actemra and convalescent plasma therapy -ARDS resolved.  COVID-19 negative x2 on 08/24/19 and 08/25/19. -Chest tube removed by CTS on 11/16.  Follow-up serial CXRs without new pneumonia -PCCM following weekly- downsized to 4 cuffless with PMV 11/28.  Capped 11/29.  Plan for decannulation today.  Fever: spiked fever.  101 overnight.  Has mild leukocytosis and cough but no other obvious source of infection.  Has HD cath.  -Discussed with ID, Dr. Graylon Good who believes this could be from inflammatory process post Covid but suggested excluding active infection. -Follow CT and blood culture  Cough: concern about silent aspiration.  CXR without acute finding.  Had mild temperature 99's but no significant fever.  Marginal leukocytosis that is resolving. -Completed 7 days of doxycycline -CT chest as above.  CKD-3 progressed to ESRD: now on HD TTS Bone mineral disorder  -Nephrology managing -HD 11/21>> -Plan for permanent access when stable and stronger  Acute on chronic anemia: Multifactorial-blood loss from gastric ulcer, renal disease and acute illness.  Had EGD with clipping of culprit vessel.   -Hgb 13.2 (admit)>>>> multiple intermittent transfusions>> 7.3 -Transfuse 1 unit -Aranesp and iron supplementation per nephrology -Monitor H&H  H. pylori infection -Per previous attending, GI, Dr. Cristina Gong to follow up in 2-3 months from hospitalization to arrange 2-week treatment and possible f/u EGD.  CVA with left hemiparesis and dysphagia: small bilateral cerebral and right cerebellar infarct on 11/3.  -No anticoagulation given GI bleed.   -Changed from full dose aspirin to [facility] aspirin 81 mg daily after discussion with neurology, Dr. Erlinda Hong on 11/28.   -Will discuss with GI about Plavix after permanent HD access -Continue statin.  Dysphagia:  -SLP upgraded to dysphagia-2 diet and nectar thickened liquids but not meeting adequate caloric intake. -cortrak clogged.  Not able to unclog.  Calorie count-if not adequate, will discuss feeding options.  Acute metabolic encephalopathy/anoxic brain injury: concern on MRI brain on 11/3.  He is somewhat sleepy today. -Infectious work-up as above -We will continue monitoring  Paroxysmal A. Fib: Now in sinus rhythm without rate or rhythm control medication. -Not on anticoagulation due to GI bleed  MSSA bacteremia: TTE negative.  Did not get TEE.  Completed 6 weeks of antibiotic course with Ancef on 11/23.  Elevated D-dimer: Likely due to acute illness as above, particularly Covid.  Dopplers negative for DVT on 10/8.   Physical deconditioning/severe debility -PT/OT  Pressure Injury 08/24/19 Sacrum Right;Left;Mid Stage II -  Partial thickness loss of dermis presenting as a shallow open ulcer with a red, pink wound bed without slough.  (Active)  08/24/19 0800  Location: Sacrum  Location Orientation: Right;Left;Mid   Staging: Stage II -  Partial thickness loss of dermis presenting as a shallow open ulcer with a red, pink wound bed without slough.  Wound Description (Comments): -- (stage 11 mid sacrum)  Present on Admission: No     Nutrition Problem: Increased nutrient needs Etiology: chronic illness  Signs/Symptoms: estimated needs  Interventions: Magic cup, MVI, Tube feeding, Calorie Count, Refer to RD note for recommendations   DVT prophylaxis: SCD Code Status: DNR/DNI Family Communication: Patient and/or RN. Available if any question. Disposition Plan: Remains inpatient Consultants: Neurology (signed off), GI (signed off), PCCM, nephrology  Procedures:  Chest tube removed, 11/16 Tunneled HD cath, 11/10 HD, 11/3 Tracheostomy 10/30 EGD 10/17. Gastric ulcer, nonbleeding visible vessel clipped CRRT 10/15 Bronchoscopy with endobronchial ballon, 10/15, repeat 10/16 Right-sided chest tube, 10/14 Right pigtail chest tube--for right PTX 10/13-2/14 Right IJ 10/2-10/10 ETT 10/2  Microbiology summarized: 10/20-COVID-19 positive. 10/2-blood cultures negative 10/3, 10/10-MRSA PCR negative 10/9-blood culture MSSA 10/9-urine culture insignificant growth. 10/9-respiratory culture with moderate staph aureus, rare Pseudomonas 10/13-blood cultures negative x5-day 10/21-BAL with Pseudomonas and Acinetobacter 10/21 and 10/24 BAL fungal cultures negative 10/28-sputum culture with Pseudomonas 10/28-blood cultures negative 11/1 and 11/2-COVID-19 negative   Sch Meds:  Scheduled Meds: . aspirin EC  81 mg Oral Daily  . atorvastatin  40 mg Oral q1800  . budesonide (PULMICORT) nebulizer solution  0.5 mg Nebulization BID  . chlorhexidine  15 mL Mouth/Throat BID  . Chlorhexidine Gluconate Cloth  6 each Topical Q0600  . darbepoetin (  ARANESP) injection - DIALYSIS  100 mcg Intravenous Q Sat-HD  . heparin injection (subcutaneous)  5,000 Units Subcutaneous Q8H  . ipratropium-albuterol  3 mL Nebulization  BID  . mouth rinse  15 mL Mouth Rinse 10 times per day  . multivitamin  1 tablet Oral QHS  . pantoprazole sodium  40 mg Per Tube Daily  . sucralfate  1 g Oral Q6H   Continuous Infusions: . sodium chloride Stopped (09/21/19 1659)  . dextrose Stopped (08/16/19 1623)  . dextrose 50 mL/hr at 09/22/19 1000  . doxycycline (VIBRAMYCIN) IV Stopped (09/22/19 0741)  . feeding supplement (NEPRO CARB STEADY) Stopped (09/19/19 0148)  . ferric gluconate (FERRLECIT/NULECIT) IV 125 mg (09/20/19 1220)   PRN Meds:.sodium chloride, acetaminophen (TYLENOL) oral liquid 160 mg/5 mL, dextrose, guaiFENesin-dextromethorphan, ondansetron (ZOFRAN) IV, pneumococcal 23 valent vaccine, polyethylene glycol, polyvinyl alcohol, Resource ThickenUp Clear, Resource ThickenUp Clear, white petrolatum  Antimicrobials: Anti-infectives (From admission, onward)   Start     Dose/Rate Route Frequency Ordered Stop   09/16/19 1700  doxycycline (VIBRAMYCIN) 100 mg in sodium chloride 0.9 % 250 mL IVPB     100 mg 125 mL/hr over 120 Minutes Intravenous Every 12 hours 09/16/19 1540 09/22/19 2359   08/26/19 2200  ceFAZolin (ANCEF) IVPB 1 g/50 mL premix     1 g 100 mL/hr over 30 Minutes Intravenous Every 24 hours 08/24/19 1021 09/15/19 2202   08/24/19 1100  meropenem (MERREM) 1 g in sodium chloride 0.9 % 100 mL IVPB     1 g 200 mL/hr over 30 Minutes Intravenous Every 24 hours 08/24/19 1021 08/25/19 1156   08/23/19 1100  ceFAZolin (ANCEF) IVPB 2g/100 mL premix  Status:  Discontinued     2 g 200 mL/hr over 30 Minutes Intravenous Every 12 hours 08/23/19 1048 08/24/19 1021   08/21/19 1700  vancomycin (VANCOCIN) IVPB 750 mg/150 ml premix  Status:  Discontinued     750 mg 150 mL/hr over 60 Minutes Intravenous Every 24 hours 08/21/19 0843 08/23/19 1048   08/21/19 1400  vancomycin (VANCOCIN) IVPB 750 mg/150 ml premix  Status:  Discontinued     750 mg 150 mL/hr over 60 Minutes Intravenous Every 24 hours 08/20/19 1146 08/20/19 1218    08/21/19 1000  meropenem (MERREM) 1 g in sodium chloride 0.9 % 100 mL IVPB  Status:  Discontinued     1 g 200 mL/hr over 30 Minutes Intravenous Every 12 hours 08/20/19 1535 08/23/19 1048   08/21/19 0413  meropenem (MERREM) 500 mg in sodium chloride 0.9 % 100 mL IVPB  Status:  Discontinued     500 mg 200 mL/hr over 30 Minutes Intravenous Every 24 hours 08/20/19 1245 08/20/19 1535   08/20/19 1330  vancomycin (VANCOCIN) 1,750 mg in sodium chloride 0.9 % 500 mL IVPB     1,750 mg 250 mL/hr over 120 Minutes Intravenous  Once 08/20/19 1222 08/20/19 1619   08/20/19 1300  vancomycin (VANCOCIN) 1,250 mg in sodium chloride 0.9 % 250 mL IVPB  Status:  Discontinued     1,250 mg 166.7 mL/hr over 90 Minutes Intravenous  Once 08/20/19 1146 08/20/19 1218   08/20/19 1224  vancomycin variable dose per unstable renal function (pharmacist dosing)  Status:  Discontinued      Does not apply See admin instructions 08/20/19 1225 08/21/19 1152   08/15/19 1600  meropenem (MERREM) 1 g in sodium chloride 0.9 % 100 mL IVPB  Status:  Discontinued     1 g 200 mL/hr over 30 Minutes Intravenous Every  12 hours 08/15/19 1543 08/20/19 1245   08/15/19 1000  voriconazole (VFEND) tablet 200 mg  Status:  Discontinued     200 mg Per Tube Every 12 hours 08/14/19 1148 08/15/19 1134   08/14/19 1600  anidulafungin (ERAXIS) 100 mg in sodium chloride 0.9 % 100 mL IVPB  Status:  Discontinued     100 mg 78 mL/hr over 100 Minutes Intravenous Every 24 hours 08/13/19 1645 08/14/19 1148   08/14/19 1300  voriconazole (VFEND) tablet 400 mg     400 mg Per Tube Every 12 hours 08/14/19 1148 08/14/19 2217   08/13/19 1700  anidulafungin (ERAXIS) 200 mg in sodium chloride 0.9 % 200 mL IVPB     200 mg 78 mL/hr over 200 Minutes Intravenous  Once 08/13/19 1645 08/13/19 2030   08/13/19 0200  ceFAZolin (ANCEF) IVPB 2g/100 mL premix  Status:  Discontinued     2 g 200 mL/hr over 30 Minutes Intravenous Every 12 hours 08/11/19 1512 08/15/19 1543   08/12/19  0200  ceFEPIme (MAXIPIME) 2 g in sodium chloride 0.9 % 100 mL IVPB     2 g 200 mL/hr over 30 Minutes Intravenous Every 12 hours 08/11/19 1509 08/12/19 1341   08/07/19 2200  ceFEPIme (MAXIPIME) 2 g in sodium chloride 0.9 % 100 mL IVPB  Status:  Discontinued     2 g 200 mL/hr over 30 Minutes Intravenous Every 12 hours 08/07/19 1540 08/11/19 1509   08/07/19 1000  anidulafungin (ERAXIS) 100 mg in sodium chloride 0.9 % 100 mL IVPB  Status:  Discontinued     100 mg 78 mL/hr over 100 Minutes Intravenous Every 24 hours 08/06/19 0934 08/08/19 1203   08/07/19 0800  ceFEPIme (MAXIPIME) 2 g in sodium chloride 0.9 % 100 mL IVPB  Status:  Discontinued     2 g 200 mL/hr over 30 Minutes Intravenous Every 24 hours 08/06/19 1036 08/07/19 1540   08/06/19 1000  anidulafungin (ERAXIS) 200 mg in sodium chloride 0.9 % 200 mL IVPB    Note to Pharmacy: please   200 mg 78 mL/hr over 200 Minutes Intravenous Every 24 hours 08/06/19 0925 08/06/19 1600   08/06/19 0800  ceFEPIme (MAXIPIME) 2 g in sodium chloride 0.9 % 100 mL IVPB  Status:  Discontinued     2 g 200 mL/hr over 30 Minutes Intravenous Every 12 hours 08/06/19 0733 08/06/19 1036   08/02/19 2200  ceFAZolin (ANCEF) IVPB 2g/100 mL premix  Status:  Discontinued     2 g 200 mL/hr over 30 Minutes Intravenous Every 8 hours 08/02/19 1338 08/06/19 0733   08/02/19 1400  vancomycin (VANCOCIN) 1,250 mg in sodium chloride 0.9 % 250 mL IVPB  Status:  Discontinued     1,250 mg 166.7 mL/hr over 90 Minutes Intravenous Every 24 hours 08/01/19 1320 08/02/19 1332   08/01/19 1330  vancomycin (VANCOCIN) 2,000 mg in sodium chloride 0.9 % 500 mL IVPB     2,000 mg 250 mL/hr over 120 Minutes Intravenous  Once 08/01/19 1158 08/01/19 1636   08/01/19 1300  ceFEPIme (MAXIPIME) 2 g in sodium chloride 0.9 % 100 mL IVPB  Status:  Discontinued     2 g 200 mL/hr over 30 Minutes Intravenous Every 12 hours 08/01/19 1158 08/02/19 1332   07/26/19 1600  remdesivir 100 mg in sodium chloride 0.9  % 250 mL IVPB     100 mg 500 mL/hr over 30 Minutes Intravenous Every 24 hours 07/26/19 0132 07/29/19 1823   07/26/19 0215  remdesivir 200 mg  in sodium chloride 0.9 % 250 mL IVPB     200 mg 500 mL/hr over 30 Minutes Intravenous Once 07/26/19 0132 07/26/19 0520       I have personally reviewed the following labs and images: CBC: Recent Labs  Lab 09/18/19 1411 09/19/19 1548 09/21/19 0551 09/22/19 0401  WBC 12.6* 11.5* 10.8* 13.1*  NEUTROABS  --  7.8*  --   --   HGB 7.3* 8.1* 7.3* 7.3*  HCT 24.1* 25.9* 23.4* 22.8*  MCV 94.5 93.2 92.9 89.8  PLT 311 359 351 352   BMP &GFR Recent Labs  Lab 09/17/19 1115 09/18/19 1411 09/19/19 1548 09/21/19 0551 09/22/19 0401  NA 134* 134* 132* 133* 130*  K 3.8 4.1 4.4 4.0 4.2  CL 97* 97* 95* 97* 94*  CO2 _0 GLUCOSE 128* 105* 96 93 87  BUN 38* 31* 44* 25* 30*  CREATININE 4.25* 3.52* 4.95* 3.79* 5.04*  CALCIUM 8.6* 8.6* 8.8* 8.7* 8.8*  MG  --   --   --   --  1.6*  PHOS 2.3* 2.2* 3.0 3.0 4.1   Estimated Creatinine Clearance: 14.9 mL/min (A) (by C-G formula based on SCr of 5.04 mg/dL (H)). Liver & Pancreas: Recent Labs  Lab 09/17/19 1115 09/18/19 1411 09/19/19 1548 09/21/19 0551 09/22/19 0401  ALBUMIN 1.9* 1.8* 1.9* 1.7* 1.7*   No results for input(s): LIPASE, AMYLASE in the last 168 hours. No results for input(s): AMMONIA in the last 168 hours. Diabetic: No results for input(s): HGBA1C in the last 72 hours. Recent Labs  Lab 09/21/19 1642 09/21/19 2012 09/22/19 0016 09/22/19 0710 09/22/19 1135  GLUCAP 89 92 86 90 82   Cardiac Enzymes: No results for input(s): CKTOTAL, CKMB, CKMBINDEX, TROPONINI in the last 168 hours. No results for input(s): PROBNP in the last 8760 hours. Coagulation Profile: No results for input(s): INR, PROTIME in the last 168 hours. Thyroid Function Tests: No results for input(s): TSH, T4TOTAL, FREET4, T3FREE, THYROIDAB in the last 72 hours. Lipid Profile: No results for input(s): CHOL,  HDL, LDLCALC, TRIG, CHOLHDL, LDLDIRECT in the last 72 hours. Anemia Panel: Recent Labs    09/21/19 0838  VITAMINB12 1,022*  FOLATE 22.3  FERRITIN 1,292*  TIBC 136*  IRON 23*  RETICCTPCT 3.3*   Urine analysis:    Component Value Date/Time   COLORURINE YELLOW 08/01/2019 1745   APPEARANCEUR HAZY (A) 08/01/2019 1745   LABSPEC 1.023 08/01/2019 1745   PHURINE 5.0 08/01/2019 1745   GLUCOSEU NEGATIVE 08/01/2019 1745   HGBUR NEGATIVE 08/01/2019 1745   BILIRUBINUR NEGATIVE 08/01/2019 1745   KETONESUR NEGATIVE 08/01/2019 1745   PROTEINUR NEGATIVE 08/01/2019 1745   NITRITE NEGATIVE 08/01/2019 1745   LEUKOCYTESUR NEGATIVE 08/01/2019 1745   Sepsis Labs: Invalid input(s): PROCALCITONIN, [place]  Microbiology: No results found for this or any previous visit (from the past 240 hour(s)).  Radiology Studies: No results found.  35 minutes with more than 50% spent in reviewing records, counseling patient/family and coordinating care.   T. Urania  If 7PM-7AM, please contact night-coverage [email or web address] Password [email or web address] 09/22/2019, 1:15 PM

## 2019-09-23 LAB — BASIC METABOLIC PANEL
Anion gap: 14 (ref 5–15)
BUN: 37 mg/dL — ABNORMAL HIGH (ref 8–23)
CO2: 20 mmol/L — ABNORMAL LOW (ref 22–32)
Calcium: 8.8 mg/dL — ABNORMAL LOW (ref 8.9–10.3)
Chloride: 95 mmol/L — ABNORMAL LOW (ref 98–111)
Creatinine, Ser: 6.15 mg/dL — ABNORMAL HIGH (ref 0.61–1.24)
GFR calc Af Amer: 10 mL/min — ABNORMAL LOW (ref >60)
GFR calc non Af Amer: 9 mL/min — ABNORMAL LOW (ref >60)
Glucose, Bld: 89 mg/dL (ref 70–99)
Potassium: 4.5 mmol/L (ref 3.5–5.1)
Sodium: 129 mmol/L — ABNORMAL LOW (ref 135–145)

## 2019-09-23 LAB — TYPE AND SCREEN
ABO/RH(D): O POS
Antibody Screen: NEGATIVE
Unit division: 0

## 2019-09-23 LAB — CBC
HCT: 26 % — ABNORMAL LOW (ref 39.0–52.0)
Hemoglobin: 8.4 g/dL — ABNORMAL LOW (ref 13.0–17.0)
MCH: 28.5 pg (ref 26.0–34.0)
MCHC: 32.3 g/dL (ref 30.0–36.0)
MCV: 88.1 fL (ref 80.0–100.0)
Platelets: 367 10*3/uL (ref 150–400)
RBC: 2.95 MIL/uL — ABNORMAL LOW (ref 4.22–5.81)
RDW: 14.9 % (ref 11.5–15.5)
WBC: 16.8 10*3/uL — ABNORMAL HIGH (ref 4.0–10.5)
nRBC: 0 % (ref 0.0–0.2)

## 2019-09-23 LAB — GLUCOSE, CAPILLARY
Glucose-Capillary: 83 mg/dL (ref 70–99)
Glucose-Capillary: 92 mg/dL (ref 70–99)
Glucose-Capillary: 89 mg/dL (ref 70–99)
Glucose-Capillary: 87 mg/dL (ref 70–99)

## 2019-09-23 LAB — BPAM RBC
Blood Product Expiration Date: 202012292359
ISSUE DATE / TIME: 202011301124
Unit Type and Rh: 5100

## 2019-09-23 LAB — MAGNESIUM: Magnesium: 1.9 mg/dL (ref 1.7–2.4)

## 2019-09-23 LAB — PHOSPHORUS: Phosphorus: 4.8 mg/dL — ABNORMAL HIGH (ref 2.5–4.6)

## 2019-09-23 MED ORDER — HEPARIN SODIUM (PORCINE) 1000 UNIT/ML IJ SOLN
INTRAMUSCULAR | Status: AC
  Filled 2019-09-23: qty 1

## 2019-09-23 MED ORDER — ALTEPLASE 2 MG IJ SOLR
INTRAMUSCULAR | Status: AC
  Administered 2019-09-23: 2 mg
  Filled 2019-09-23: qty 2

## 2019-09-23 NOTE — Progress Notes (Deleted)
Pt is agreeable to working with PT and note his improving activation of LE's with ex's.  He is with wife for part of session and talked with her about having pt use a written system to keep up with all his caregivers and events in hosp.  She agreed to bring a notepad for this, and will continue on with PT as ordered to work toward [employer] on LE's.     09/23/19 1200  PT Visit Information  Last PT Received On 09/23/19  Assistance Needed +2  History of Present Illness 68 y/o male diagnosed with COVID on 9/29 presented to the [facility] ED on 10/2 with dyspnea, hypoxemia requiring intubation.  Admitted to [facility], developed AKI requiring CRRT, pneumothorax requiring chest tube and had a large bronchopleural fistula treated for 10 days with a bronchial blocker.  Tracheostomy placed on 10/30. Tested COVID neg x2 on 11/2. MRI on 11/4 showed There is cortical restricted diffusion posteriorly in the right frontal lobe consistent with acute infarct. Additional smaller regions of diffusion abnormality are present in the posterior left frontal lobe, anterior left frontal lobe, and right greater than left medial parietal lobes as well as right cerebellum.   Subjective Data  Subjective states he is noticing the posture change from vent  Patient Stated Goal to stand up  Precautions  Precautions Fall  Precaution Comments skin breakdown on heels and joints, has transfusion going on during therapy  Required Braces or Orthoses Other Brace  Other Brace bil prevalon boots  Restrictions  Weight Bearing Restrictions No  Other Position/Activity Restrictions elevate LUE when not exercising  Pain Assessment  Pain Assessment No/denies pain  Cognition  Arousal/Alertness Awake/alert  Behavior During Therapy Flat affect  Overall Cognitive Status Impaired/Different from baseline  Area of Impairment Memory;Attention;Orientation  Current Attention Level Sustained  Memory Decreased short-term memory  Following Commands Follows one step  commands consistently  Safety/Judgement Decreased awareness of deficits  Awareness Intellectual  Problem Solving Slow processing;Requires verbal cues  General Comments pt is struggling to remember all the staff who are helping and talking to him  Bed Mobility  Overal bed mobility Needs Assistance  Bed Mobility Rolling (scooting up bed)  Rolling Max assist  General bed mobility comments 2 max to scoot up bed  Transfers  Overall transfer level Needs assistance  General transfer comment not able to try  Exercises  Exercises General Lower Extremity  General Exercises - Lower Extremity  Ankle Circles/Pumps AAROM;5 reps  Long Arc Quad Left;10 reps;AAROM  Heel Slides AAROM;10 reps  Hip ABduction/ADduction AAROM;10 reps  Straight Leg Raises AAROM;10 reps  Hip Flexion/Marching AAROM;10 reps  PT - End of Session  Equipment Utilized During Treatment Oxygen  Activity Tolerance Patient tolerated treatment well;Treatment limited secondary to medical complications (Comment)  Patient left in bed;with call bell/phone within reach;with family/visitor present  Nurse Communication Mobility status   PT - Assessment/Plan  PT Plan Current plan remains appropriate  PT Visit Diagnosis Unsteadiness on feet (R26.81);Other abnormalities of gait and mobility (R26.89);Muscle weakness (generalized) (M62.81);Difficulty in walking, not elsewhere classified (R26.2)  PT Frequency (ACUTE ONLY) Min 3X/week  Recommendations for Other Services Rehab consult  Follow Up Recommendations CIR  PT equipment Wheelchair (measurements PT);Wheelchair cushion (measurements PT);Hospital bed;Other (comment)  AM-PAC PT "6 Clicks" Mobility Outcome Measure (Version 2)  Help needed turning from your back to your side while in a flat bed without using bedrails? 2  Help needed moving from lying on your back to sitting on the side of a  flat bed without using bedrails? 1  Help needed moving to and from a bed to a chair (including a  wheelchair)? 1  Help needed standing up from a chair using your arms (e.g., wheelchair or bedside chair)? 1  Help needed to walk in hospital room? 1  Help needed climbing 3-5 steps with a railing?  1  6 Click Score 7  Consider Recommendation of Discharge To: CIR/SNF/LTACH  PT Goal Progression  Progress towards PT goals Progressing toward goals    Mee Hives, PT MS Acute Rehab Dept. Number: [place]

## 2019-09-23 NOTE — Progress Notes (Signed)
Pt is agreeable to working with PT and note his improving activation of LE's with ex's.  He is with wife for part of session and talked with her about having pt use a written system to keep up with all his caregivers and events in hosp.  She agreed to bring a notepad for this, and will continue on with PT as ordered to work toward [employer] on LE's.      09/23/19 1200  PT Visit Information  Last PT Received On 09/23/19  Assistance Needed +2  History of Present Illness 68 y/o male diagnosed with COVID on 9/29 presented to the [facility] ED on 10/2 with dyspnea, hypoxemia requiring intubation.  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  Tracheostomy placed on 10/30. Tested COVID neg x2 on 11/2. MRI on 11/4 showed There is cortical restricted diffusion posteriorly in the right frontal lobe consistent with acute infarct. Additional smaller regions of diffusion abnormality are present in the posterior left frontal lobe, anterior left frontal lobe, and right greater than left medial parietal lobes as well as right cerebellum.   Subjective Data  Subjective states he is noticing the posture change from vent  Patient Stated Goal to stand up  Precautions  Precautions Fall  Precaution Comments skin breakdown on heels and joints, has transfusion going on during therapy  Required Braces or Orthoses Other Brace  Other Brace bil prevalon boots  Restrictions  Weight Bearing Restrictions No  Other Position/Activity Restrictions elevate LUE when not exercising  Pain Assessment  Pain Assessment No/denies pain  Cognition  Arousal/Alertness Awake/alert  Behavior During Therapy Flat affect  Overall Cognitive Status Impaired/Different from baseline  Area of Impairment Memory;Attention;Orientation  Current Attention Level Sustained  Memory Decreased short-term memory  Following Commands Follows one step  commands consistently  Safety/Judgement Decreased awareness of deficits  Awareness Intellectual  Problem Solving Slow processing;Requires verbal cues  General Comments pt is struggling to remember all the staff who are helping and talking to him  Bed Mobility  Overal bed mobility Needs Assistance  Bed Mobility Rolling (scooting up bed)  Rolling Max assist  General bed mobility comments 2 max to scoot up bed  Transfers  Overall transfer level Needs assistance  General transfer comment not able to try  Exercises  Exercises General Lower Extremity  General Exercises - Lower Extremity  Ankle Circles/Pumps AAROM;5 reps  Long Arc Quad Left;10 reps;AAROM  Heel Slides AAROM;10 reps  Hip ABduction/ADduction AAROM;10 reps  Straight Leg Raises AAROM;10 reps  Hip Flexion/Marching AAROM;10 reps  PT - End of Session  Equipment Utilized During Treatment Oxygen  Activity Tolerance Patient tolerated treatment well;Treatment limited secondary to medical complications (Comment)  Patient left in bed;with call bell/phone within reach;with family/visitor present  Nurse Communication Mobility status   PT - Assessment/Plan  PT Plan Current plan remains appropriate  PT Visit Diagnosis Unsteadiness on feet (R26.81);Other abnormalities of gait and mobility (R26.89);Muscle weakness (generalized) (M62.81);Difficulty in walking, not elsewhere classified (R26.2)  PT Frequency (ACUTE ONLY) Min 3X/week  Recommendations for Other Services Rehab consult  Follow Up Recommendations CIR  PT equipment Wheelchair (measurements PT);Wheelchair cushion (measurements PT);Hospital bed;Other (comment)  AM-PAC PT "6 Clicks" Mobility Outcome Measure (Version 2)  Help needed turning from your back to your side while in a flat bed without using bedrails? 2  Help needed moving from lying on your back to sitting on the side of  a flat bed without using bedrails? 1  Help needed moving to and from a bed to a chair (including a  wheelchair)? 1  Help needed standing up from a chair using your arms (e.g., wheelchair or bedside chair)? 1  Help needed to walk in hospital room? 1  Help needed climbing 3-5 steps with a railing?  1  6 Click Score 7  Consider Recommendation of Discharge To: CIR/SNF/LTACH  PT Goal Progression  Progress towards PT goals Progressing toward goals  PT Time Calculation  PT Start Time (ACUTE ONLY) 1029  PT Stop Time (ACUTE ONLY) 1058  PT Time Calculation (min) (ACUTE ONLY) 29 min  PT General Charges  $$ ACUTE PT VISIT 1 Visit  PT Treatments  $Therapeutic Exercise 8-22 mins  $Therapeutic Activity 8-22 mins    Mee Hives, PT MS Acute Rehab Dept. Number: [facility]

## 2019-09-23 NOTE — Progress Notes (Signed)
PROGRESS NOTE  [name] WCH:[id number] DOB: September 22, 1951   PCP: Kathyrn Drown, MD  Patient is from: Home  DOA: 07/25/2019 LOS: 61  Brief Narrative / Interim history: 68 year old male with PMH of GERD and BPH, initially presented on 07/25/2019 with worsening dyspnea after recently testing positive for COVID-19 on 07/22/2019.  He required emergent intubation at [facility] ED.  He was admitted to the [facility] hospital for acute hypoxic respiratory failure secondary to severe ARDS from Covid pneumonia and completed Decadron, Remdesivir and Tocilizumab.  Hospital course complicated by acute kidney injury requiring CRRT, encephalopathy, pneumothorax complicated by persistent air leak and bronchopleural fistula requiring endobronchial blocker and pleur-evac, prolonged mechanical ventilation s/p tracheostomy on 10/30, MSSA bacteremia with negative echocardiogram intermittent pressor support with dialysis, melena with acute blood loss anemia requiring transfusion, IV PPI, empiric octreotide, EGD showed gastric ulcer and nonbleeding visible vessel clipped, transferred to [facility] on 08/26/2023 intermittent HD, subacute right cerebral/cerebellar infarcts on MRI.  PCCM following for tracheostomy management, weekly.  Nephrology on board for HD needs.  VVS plans permanent dialysis access after patient improves overall.  Subjective: No major events overnight of this morning.  No complaint this morning.  Cough improved.  He was decannulated yesterday.  He denies chest pain, dyspnea, GI or new focal neuro symptoms.  He likes the nasal cannula to be taken off.  Objective: Vitals:   09/22/19 2028 09/23/19 0432 09/23/19 0746 09/23/19 0846  BP:  (!) 153/86 (!) 145/86   Pulse: (!) 104 (!) 127 (!) 120 (!) 126  Resp: _0 Temp:  98.4 F (36.9 C) 99.4 F (37.4 C)   TempSrc:   Oral   SpO2: 99% 98% 100% 100%  Weight:      Height:        Intake/Output Summary (Last 24 hours) at 09/23/2019  1024 Last data filed at 09/23/2019 0742 Gross per 24 hour  Intake 1997.83 ml  Output 625 ml  Net 1372.83 ml   Filed Weights   09/17/19 2033 09/18/19 2136 09/20/19 1952  Weight: 86.8 kg 86.3 kg 86.5 kg    Examination:  GENERAL: No acute distress.  Appears well.  HEENT: MMM.  Vision and hearing grossly intact.  NECK: Trach removed.  Dressing over neck dry, clean and intact. RESP:  No IWOB.  Fair air movement bilaterally.  Rhonchi bilaterally. CVS:  RRR. Heart sounds normal.  ABD/GI/GU: Bowel sounds present. Soft. Non tender.  MSK/EXT: Left hemiparesis.  No apparent deformity or edema.  SKIN: no apparent skin lesion or wound NEURO: Awake, alert and oriented appropriately.  Left hemiparesis. PSYCH: Calm. Normal affect.  Assessment & Plan: Acute hypoxic respiratory failure due to ARDS from Covid pneumonia status post trach Right pneumothorax/empyema/necrotic RLL/bronchopleural fistula with large air leak Pseudomonas and Acinetobacter in BAL: completed 10 days course of meropenem. -COVID-19 positive on 07/13/2019. S/p remdesivir, Decadron, Actemra and convalescent plasma therapy -ARDS resolved.  COVID-19 negative x2 on 08/24/19 and 08/25/19. -Chest tube removed by CTS on 11/16.  Follow-up serial CXRs without new pneumonia -Decannulated by [facility] 11/30.  Fever: spiked fever to 101 on 11/29.  CT chest on 11/30 without acute finding to explain patient's fever.  Blood cultures on 11/30 negative so far. Per ID, could be due to post Covid inflammatory process but need to exclude other possibilities, and observe off antibiotics. -Trend leukocytosis and fever curve.  Cough: concern about silent aspiration. CT chest as above.  Completed 7 days of doxycycline.  Improving. -  As needed breathing treatments and antitussives  CKD-3 progressed to ESRD: now on HD TTS Bone mineral disorder -Nephrology managing -HD 11/21>> cannot do permanent access or OP HD due to debility/deconditioning  Acute on  chronic anemia: Multifactorial-blood loss from gastric ulcer, renal disease and acute illness.  Had EGD with clipping of culprit vessel.   -Hgb 13.2 (admit)>>>> multiple intermittent transfusions>> 7.3>1u> 8.4 -Aranesp and iron supplementation per nephrology -Monitor H&H  H. pylori infection -Per previous attending, GI, Dr. Cristina Gong to follow up in 2-3 months from hospitalization to arrange 2-week treatment and possible f/u EGD.  CVA with left hemiparesis and dysphagia: small bilateral cerebral and right cerebellar infarct on 11/3.  -No anticoagulation given GI bleed.   -Changed from full dose aspirin to [facility] aspirin 81 mg daily after discussion with neurology, Dr. Erlinda Hong on 11/28.   -Will discuss with GI about Plavix after final decision about permanent access for HD -Continue statin.  Dysphagia: cortrak clogged and removed 11/30. -SLP upgraded to dysphagia-2 diet and nectar thickened liquids but not meeting adequate caloric intake. -Calorie counting progress  Acute metabolic encephalopathy/anoxic brain injury: concern on MRI brain on 11/3.  Encephalopathy resolved. -Continue monitoring and delirium precautions  Paroxysmal A. Fib: Now in sinus rhythm without rate or rhythm control medication. -Not on anticoagulation due to GI bleed  MSSA bacteremia: TTE negative.  Did not get TEE.  Completed 6 weeks of antibiotic course with Ancef on 11/23.  Elevated D-dimer: Likely due to acute illness as above, particularly Covid.  Dopplers negative for DVT on 10/8.   Physical deconditioning/severe debility -Ongoing PT/OT/SLP-therapy recommending CIR  Pressure Injury 08/24/19 Sacrum Right;Left;Mid Stage II -  Partial thickness loss of dermis presenting as a shallow open ulcer with a red, pink wound bed without slough.  (Active)  08/24/19 0800  Location: Sacrum  Location Orientation: Right;Left;Mid  Staging: Stage II -  Partial thickness loss of dermis presenting as a shallow open ulcer with a red, pink  wound bed without slough.  Wound Description (Comments): -- (stage 11 mid sacrum)  Present on Admission: No     Nutrition Problem: Increased nutrient needs Etiology: chronic illness  Signs/Symptoms: estimated needs  Interventions: Magic cup, MVI, Tube feeding, Calorie Count, Refer to RD note for recommendations   DVT prophylaxis: SCD Code Status: DNR/DNI Family Communication: Patient and/or RN. Available if any question. Disposition Plan: Remains inpatient.  Therapy recommending CIR for final disposition Consultants: Neurology (signed off), GI (signed off), PCCM, nephrology  Procedures:  10/02 ETT>10/30 10/03 Admit to [place] from [facility], start decadron, remdesivir,  tociluzimab and convalescent plasma 10/10 MSSA bacteremia, CVL d/ced; ID consulted; increase OG tube outpt 10/11 vent weaning trial started 10/13 fever, pneumothorax, pig tail chest tube placed 10/14 renal failure 10/15 CRRT 10/17 EGD- gastric ulcer, nonbleeding visible vessel clipped 10/30 tracheostomy  11/1 D/C CRRT 11/2 Transfer to [facility] 11/3 Started on IHD 11/4 weaning on ATC 11/10 tunneled HD cath 11/16 Chest tube removed  11/30 decannulated.  Microbiology summarized: 10/20-COVID-19 positive. 10/2-blood cultures negative 10/3, 10/10-MRSA PCR negative 10/9-blood culture MSSA 10/9-urine culture insignificant growth. 10/9-respiratory culture with moderate staph aureus, rare Pseudomonas 10/13-blood cultures negative x5-day 10/21-BAL with Pseudomonas and Acinetobacter 10/21 and 10/24 BAL fungal cultures negative 10/28-sputum culture with Pseudomonas 10/28-blood cultures negative 11/1 and 11/2-COVID-19 negative 11/30-blood cultures negative so far   Sch Meds:  Scheduled Meds:  aspirin EC  81 mg Oral Daily   atorvastatin  40 mg Oral q1800   budesonide (PULMICORT) nebulizer solution  0.5 mg Nebulization BID   chlorhexidine  15 mL Mouth/Throat BID   Chlorhexidine Gluconate Cloth  6 each Topical Q0600    darbepoetin (ARANESP) injection - DIALYSIS  100 mcg Intravenous Q Sat-HD   heparin injection (subcutaneous)  5,000 Units Subcutaneous Q8H   mouth rinse  15 mL Mouth Rinse 10 times per day   multivitamin  1 tablet Oral QHS   pantoprazole sodium  40 mg Per Tube Daily   sucralfate  1 g Oral Q6H   Continuous Infusions:  sodium chloride Stopped (09/21/19 1659)   feeding supplement (NEPRO CARB STEADY) Stopped (09/19/19 0148)   ferric gluconate (FERRLECIT/NULECIT) IV 125 mg (09/20/19 1220)   PRN Meds:.sodium chloride, acetaminophen (TYLENOL) oral liquid 160 mg/5 mL, guaiFENesin-dextromethorphan, ipratropium-albuterol, ondansetron (ZOFRAN) IV, pneumococcal 23 valent vaccine, polyethylene glycol, polyvinyl alcohol, Resource ThickenUp Clear, Resource ThickenUp Clear, white petrolatum  Antimicrobials: Anti-infectives (From admission, onward)   Start     Dose/Rate Route Frequency Ordered Stop   09/16/19 1700  doxycycline (VIBRAMYCIN) 100 mg in sodium chloride 0.9 % 250 mL IVPB  Status:  Discontinued     100 mg 125 mL/hr over 120 Minutes Intravenous Every 12 hours 09/16/19 1540 09/22/19 1840   08/26/19 2200  ceFAZolin (ANCEF) IVPB 1 g/50 mL premix     1 g 100 mL/hr over 30 Minutes Intravenous Every 24 hours 08/24/19 1021 09/15/19 2202   08/24/19 1100  meropenem (MERREM) 1 g in sodium chloride 0.9 % 100 mL IVPB     1 g 200 mL/hr over 30 Minutes Intravenous Every 24 hours 08/24/19 1021 08/25/19 1156   08/23/19 1100  ceFAZolin (ANCEF) IVPB 2g/100 mL premix  Status:  Discontinued     2 g 200 mL/hr over 30 Minutes Intravenous Every 12 hours 08/23/19 1048 08/24/19 1021   08/21/19 1700  vancomycin (VANCOCIN) IVPB 750 mg/150 ml premix  Status:  Discontinued     750 mg 150 mL/hr over 60 Minutes Intravenous Every 24 hours 08/21/19 0843 08/23/19 1048   08/21/19 1400  vancomycin (VANCOCIN) IVPB 750 mg/150 ml premix  Status:  Discontinued     750 mg 150 mL/hr over 60 Minutes Intravenous Every 24  hours 08/20/19 1146 08/20/19 1218   08/21/19 1000  meropenem (MERREM) 1 g in sodium chloride 0.9 % 100 mL IVPB  Status:  Discontinued     1 g 200 mL/hr over 30 Minutes Intravenous Every 12 hours 08/20/19 1535 08/23/19 1048   08/21/19 0413  meropenem (MERREM) 500 mg in sodium chloride 0.9 % 100 mL IVPB  Status:  Discontinued     500 mg 200 mL/hr over 30 Minutes Intravenous Every 24 hours 08/20/19 1245 08/20/19 1535   08/20/19 1330  vancomycin (VANCOCIN) 1,750 mg in sodium chloride 0.9 % 500 mL IVPB     1,750 mg 250 mL/hr over 120 Minutes Intravenous  Once 08/20/19 1222 08/20/19 1619   08/20/19 1300  vancomycin (VANCOCIN) 1,250 mg in sodium chloride 0.9 % 250 mL IVPB  Status:  Discontinued     1,250 mg 166.7 mL/hr over 90 Minutes Intravenous  Once 08/20/19 1146 08/20/19 1218   08/20/19 1224  vancomycin variable dose per unstable renal function (pharmacist dosing)  Status:  Discontinued      Does not apply See admin instructions 08/20/19 1225 08/21/19 1152   08/15/19 1600  meropenem (MERREM) 1 g in sodium chloride 0.9 % 100 mL IVPB  Status:  Discontinued     1 g 200 mL/hr over 30 Minutes Intravenous Every 12  hours 08/15/19 1543 08/20/19 1245   08/15/19 1000  voriconazole (VFEND) tablet 200 mg  Status:  Discontinued     200 mg Per Tube Every 12 hours 08/14/19 1148 08/15/19 1134   08/14/19 1600  anidulafungin (ERAXIS) 100 mg in sodium chloride 0.9 % 100 mL IVPB  Status:  Discontinued     100 mg 78 mL/hr over 100 Minutes Intravenous Every 24 hours 08/13/19 1645 08/14/19 1148   08/14/19 1300  voriconazole (VFEND) tablet 400 mg     400 mg Per Tube Every 12 hours 08/14/19 1148 08/14/19 2217   08/13/19 1700  anidulafungin (ERAXIS) 200 mg in sodium chloride 0.9 % 200 mL IVPB     200 mg 78 mL/hr over 200 Minutes Intravenous  Once 08/13/19 1645 08/13/19 2030   08/13/19 0200  ceFAZolin (ANCEF) IVPB 2g/100 mL premix  Status:  Discontinued     2 g 200 mL/hr over 30 Minutes Intravenous Every 12 hours  08/11/19 1512 08/15/19 1543   08/12/19 0200  ceFEPIme (MAXIPIME) 2 g in sodium chloride 0.9 % 100 mL IVPB     2 g 200 mL/hr over 30 Minutes Intravenous Every 12 hours 08/11/19 1509 08/12/19 1341   08/07/19 2200  ceFEPIme (MAXIPIME) 2 g in sodium chloride 0.9 % 100 mL IVPB  Status:  Discontinued     2 g 200 mL/hr over 30 Minutes Intravenous Every 12 hours 08/07/19 1540 08/11/19 1509   08/07/19 1000  anidulafungin (ERAXIS) 100 mg in sodium chloride 0.9 % 100 mL IVPB  Status:  Discontinued     100 mg 78 mL/hr over 100 Minutes Intravenous Every 24 hours 08/06/19 0934 08/08/19 1203   08/07/19 0800  ceFEPIme (MAXIPIME) 2 g in sodium chloride 0.9 % 100 mL IVPB  Status:  Discontinued     2 g 200 mL/hr over 30 Minutes Intravenous Every 24 hours 08/06/19 1036 08/07/19 1540   08/06/19 1000  anidulafungin (ERAXIS) 200 mg in sodium chloride 0.9 % 200 mL IVPB    Note to Pharmacy: please   200 mg 78 mL/hr over 200 Minutes Intravenous Every 24 hours 08/06/19 0925 08/06/19 1600   08/06/19 0800  ceFEPIme (MAXIPIME) 2 g in sodium chloride 0.9 % 100 mL IVPB  Status:  Discontinued     2 g 200 mL/hr over 30 Minutes Intravenous Every 12 hours 08/06/19 0733 08/06/19 1036   08/02/19 2200  ceFAZolin (ANCEF) IVPB 2g/100 mL premix  Status:  Discontinued     2 g 200 mL/hr over 30 Minutes Intravenous Every 8 hours 08/02/19 1338 08/06/19 0733   08/02/19 1400  vancomycin (VANCOCIN) 1,250 mg in sodium chloride 0.9 % 250 mL IVPB  Status:  Discontinued     1,250 mg 166.7 mL/hr over 90 Minutes Intravenous Every 24 hours 08/01/19 1320 08/02/19 1332   08/01/19 1330  vancomycin (VANCOCIN) 2,000 mg in sodium chloride 0.9 % 500 mL IVPB     2,000 mg 250 mL/hr over 120 Minutes Intravenous  Once 08/01/19 1158 08/01/19 1636   08/01/19 1300  ceFEPIme (MAXIPIME) 2 g in sodium chloride 0.9 % 100 mL IVPB  Status:  Discontinued     2 g 200 mL/hr over 30 Minutes Intravenous Every 12 hours 08/01/19 1158 08/02/19 1332   07/26/19 1600   remdesivir 100 mg in sodium chloride 0.9 % 250 mL IVPB     100 mg 500 mL/hr over 30 Minutes Intravenous Every 24 hours 07/26/19 0132 07/29/19 1823   07/26/19 0215  remdesivir 200 mg in  sodium chloride 0.9 % 250 mL IVPB     200 mg 500 mL/hr over 30 Minutes Intravenous Once 07/26/19 0132 07/26/19 0520       I have personally reviewed the following labs and images: CBC: Recent Labs  Lab 09/18/19 1411 09/19/19 1548 09/21/19 0551 09/22/19 0401 09/23/19 0714  WBC 12.6* 11.5* 10.8* 13.1* 16.8*  NEUTROABS  --  7.8*  --   --   --   HGB 7.3* 8.1* 7.3* 7.3* 8.4*  HCT 24.1* 25.9* 23.4* 22.8* 26.0*  MCV 94.5 93.2 92.9 89.8 88.1  PLT 311 359 351 352 367   BMP &GFR Recent Labs  Lab 09/18/19 1411 09/19/19 1548 09/21/19 0551 09/22/19 0401 09/23/19 0714  NA 134* 132* 133* 130* 129*  K 4.1 4.4 4.0 4.2 4.5  CL 97* 95* 97* 94* 95*  CO2 _0 20*  GLUCOSE 105* 96 93 87 89  BUN 31* 44* 25* 30* 37*  CREATININE 3.52* 4.95* 3.79* 5.04* 6.15*  CALCIUM 8.6* 8.8* 8.7* 8.8* 8.8*  MG  --   --   --  1.6* 1.9  PHOS 2.2* 3.0 3.0 4.1 4.8*   Estimated Creatinine Clearance: 12.2 mL/min (A) (by C-G formula based on SCr of 6.15 mg/dL (H)). Liver & Pancreas: Recent Labs  Lab 09/17/19 1115 09/18/19 1411 09/19/19 1548 09/21/19 0551 09/22/19 0401  ALBUMIN 1.9* 1.8* 1.9* 1.7* 1.7*   No results for input(s): LIPASE, AMYLASE in the last 168 hours. No results for input(s): AMMONIA in the last 168 hours. Diabetic: No results for input(s): HGBA1C in the last 72 hours. Recent Labs  Lab 09/22/19 1645 09/22/19 2011 09/22/19 2356 09/23/19 0432 09/23/19 0722  GLUCAP 81 90 88 83 92   Cardiac Enzymes: No results for input(s): CKTOTAL, CKMB, CKMBINDEX, TROPONINI in the last 168 hours. No results for input(s): PROBNP in the last 8760 hours. Coagulation Profile: No results for input(s): INR, PROTIME in the last 168 hours. Thyroid Function Tests: No results for input(s): TSH, T4TOTAL, FREET4,  T3FREE, THYROIDAB in the last 72 hours. Lipid Profile: No results for input(s): CHOL, HDL, LDLCALC, TRIG, CHOLHDL, LDLDIRECT in the last 72 hours. Anemia Panel: Recent Labs    09/21/19 0838  VITAMINB12 1,022*  FOLATE 22.3  FERRITIN 1,292*  TIBC 136*  IRON 23*  RETICCTPCT 3.3*   Urine analysis:    Component Value Date/Time   COLORURINE YELLOW 08/01/2019 1745   APPEARANCEUR HAZY (A) 08/01/2019 1745   LABSPEC 1.023 08/01/2019 1745   PHURINE 5.0 08/01/2019 1745   GLUCOSEU NEGATIVE 08/01/2019 1745   HGBUR NEGATIVE 08/01/2019 1745   BILIRUBINUR NEGATIVE 08/01/2019 1745   KETONESUR NEGATIVE 08/01/2019 1745   PROTEINUR NEGATIVE 08/01/2019 1745   NITRITE NEGATIVE 08/01/2019 1745   LEUKOCYTESUR NEGATIVE 08/01/2019 1745   Sepsis Labs: Invalid input(s): PROCALCITONIN, [place]  Microbiology: Recent Results (from the past 240 hour(s))  Culture, blood (routine x 2)     Status: None (Preliminary result)   Collection Time: 09/22/19  9:10 AM   Specimen: BLOOD RIGHT HAND  Result Value Ref Range Status   Specimen Description BLOOD RIGHT HAND  Final   Special Requests AEROBIC BOTTLE ONLY Blood Culture adequate volume  Final   Culture   Final    NO GROWTH < 24 HOURS Performed at [facility], [address], [place], [place] [address]    Report Status PENDING  Incomplete  Culture, blood (routine x 2)     Status: None (Preliminary result)   Collection Time:  09/22/19  9:17 AM   Specimen: BLOOD LEFT HAND  Result Value Ref Range Status   Specimen Description BLOOD LEFT HAND  Final   Special Requests AEROBIC BOTTLE ONLY Blood Culture adequate volume  Final   Culture   Final    NO GROWTH < 24 HOURS Performed at [facility], [address] [place], [place] [address]    Report Status PENDING  Incomplete    Radiology Studies: Ct Chest Wo Contrast  Result Date: 09/22/2019 CLINICAL DATA:  68 year old male with shortness of breath and pneumonia. Patient was found to have  COVID-19. Patient presenting with rales. EXAM: CT CHEST WITHOUT CONTRAST TECHNIQUE: Multidetector CT imaging of the chest was performed following the standard protocol without IV contrast. COMPARISON:  Chest CT dated 09/01/2019. FINDINGS: Evaluation of this exam is limited in the absence of intravenous contrast. Cardiovascular: There is mild cardiomegaly. No significant pericardial effusion. There is hypoattenuation of the cardiac blood pool suggestive of a degree of anemia. Clinical correlation is recommended. The thoracic aorta and central pulmonary arteries are grossly unremarkable. Mediastinum/Nodes: No hilar or mediastinal adenopathy. The esophagus is grossly unremarkable. Left IJ dialysis catheter with tip over central SVC. Lungs/Pleura: Small bilateral pleural effusions, increased since the prior CT. There has been interval removal of the right-sided chest tube with increased air within the right pleural space. A tract seen in the right lateral chest wall corresponding to the location of the removed chest tube. There appears to be communication of air between the pleural space and chest wall. This likely results in introduction of air into the right pleural space. Clinical correlation is recommended. Diffuse interstitial linear and streaky densities with mild bilateral bronchiectasis. Overall improvement in aeration of the lungs and decrease in the diffuse airspace ground-glass density compared to the prior CT. A 5 x 6 cm cystic area in the right middle lobe has decreased in size since the prior CT (previously measuring approximately 10 x 8 cm). There has been interval removal of the tracheostomy. Upper Abdomen: A 1 cm hypodense lesion in the dome of the liver is not well characterized but likely represent cysts. There is metallic density in the distal stomach similar to prior CT which may represent surgical material or ingested foreign matter. Musculoskeletal: No chest wall mass or suspicious bone lesions  identified. IMPRESSION: 1. Interval removal of the right-sided chest tube with increased air within the right pleural space. There appears to be communication of air between the pleural space and chest wall along the track of the removed chest tube resulting in introduction of air into the right pleural space. Clinical correlation is recommended. 2. Interval decrease in the size of the right middle lobe cystic area compared to the prior CT. 3. Small bilateral pleural effusions, increased since the prior CT. 4. Overall improvement in the aeration of the lungs and diffuse airspace ground-glass density compared to the prior CT. 5. Mild cardiomegaly. 6. Aortic Atherosclerosis (ICD10-I70.0). Electronically Signed   By: Anner Crete M.D.   On: 09/22/2019 20:05    35 minutes with more than 50% spent in reviewing records, counseling patient/family and coordinating care.  Kelbie Moro T. Ogema  If 7PM-7AM, please contact night-coverage [email or web address] Password [facility] [place] 09/23/2019, 10:24 AM

## 2019-09-23 NOTE — Progress Notes (Signed)
Palliative Medicine RN Note: PMT has been following from a distance. Discussed Mr. Dumond care with PMT provider. Note improvement and decannulation. Family has our contact information. At this time, PMT has no further role in Mr [facility] care. If new, specific needs arise, please re-consult. At this time, we will sign off.  Marjie Skiff Blondine Hottel, RN, BSN, [facility] Palliative Medicine Team 09/23/2019 1:14 PM Office [phone]

## 2019-09-23 NOTE — Progress Notes (Signed)
  Speech Language Pathology Treatment: Dysphagia;Cognitive-Linquistic  Patient Details Name: [name] MRN: [id number] DOB: Dec 08, 1950 Today's Date: 09/23/2019 Time: 0940-1010 SLP Time Calculation (min) (ACUTE ONLY): 30 min  Assessment / Plan / Recommendation Clinical Impression  Pt has been decannulated, demonstrates verbal initiation to direct self care in many opportunities. Pt still needs quite a bit of direction regarding awareness of deficits and realistic goals regarding positioning. MDs have reported concern about potential silent aspiration given temp spikes of unknown source on 11/29 and 11/30. Today pt shows good potential for tolerance of upgraded regular texture foods and thin liquids; pt did not have any coughing today until after the session when he laid down to rest.  Seems like a good time to reassess swallow with instrumental test to determine risk of silent aspiration and also potential for diet upgrade. Will plan for MBS tomorrow.   HPI HPI: 68 year old with a history of GERD and BPH who presented to [facility], ED with S OB and was found to be hypoxic.  He was initially diagnosed with COVID-19 September 20.  1 to 2 days prior to his presentation he developed worsening shortness of breath with anorexia. Admitted on 10/3, intubated.  EGD on 10/17 showed normal esophagus but excessive gastric fluid. CT scan of the chest on 10/21 showed findings consistent with an empyema as well as persistent pneumothorax.  Bedside bronchoscopy revealed material in the airway. Trached on 10/30. On ATC by 10/31.Additionally MRI on 11/4 showed There is cortical restricted diffusion posteriorly in the right frontal lobe consistent with acute infarct. Additional smaller regions of diffusion abnormality are present in the posterior left frontal lobe, anterior left frontal lobe, and right greater than left medial parietal lobes as well as right cerebellum. Pt now decannulated on 11/30.       SLP Plan  Continue with current plan of care;MBS       Recommendations  Diet recommendations: Dysphagia 2 (fine chop);Nectar-thick liquid Liquids provided via: Cup;Straw Medication Administration: Crushed with puree Supervision: Staff to assist with self feeding;Full supervision/cueing for compensatory strategies Compensations: Slow rate;Small sips/bites Postural Changes and/or Swallow Maneuvers: Seated upright 90 degrees;Upright 30-60 min after meal                Oral Care Recommendations: Oral care BID Follow up Recommendations: Inpatient Rehab Plan: Continue with current plan of care;MBS       GO               [name], MA [place] Pager [phone] Office [phone]  Lynann Beaver 09/23/2019, 10:43 AM

## 2019-09-23 NOTE — Progress Notes (Signed)
[facility] Progress Note   Subjective:  Seen in room - trach OUT! He is more awake and talkative today. Denies dyspnea/CP. For HD later today.   Objective Vitals:   09/22/19 2028 09/23/19 0432 09/23/19 0746 09/23/19 0846  BP:  (!) 153/86 (!) 145/86   Pulse: (!) 104 (!) 127 (!) 120 (!) 126  Resp: 20 20 18 18   Temp:  98.4 F (36.9 C) 99.4 F (37.4 C)   TempSrc:   Oral   SpO2: 99% 98% 100% 100%  Weight:      Height:       Physical Exam General:More well appearing male, NAD. ENT: NG and trach out Heart:RRR, no murmur Lungs:CTA anteriorly Abdomen:Soft, non-distended, +BS Extremities:Trace LLE edema, some LUE edema as well Dialysis Access:L IJ [facility]  Additional Objective Labs: Basic Metabolic Panel: Recent Labs  Lab 09/21/19 0551 09/22/19 0401 09/23/19 0714  NA 133* 130* 129*  K 4.0 4.2 4.5  CL 97* 94* 95*  CO2 24 26 20*  GLUCOSE 93 87 89  BUN 25* 30* 37*  CREATININE 3.79* 5.04* 6.15*  CALCIUM 8.7* 8.8* 8.8*  PHOS 3.0 4.1 4.8*   Liver Function Tests: Recent Labs  Lab 09/19/19 1548 09/21/19 0551 09/22/19 0401  ALBUMIN 1.9* 1.7* 1.7*   CBC: Recent Labs  Lab 09/18/19 1411 09/19/19 1548 09/21/19 0551 09/22/19 0401 09/23/19 0714  WBC 12.6* 11.5* 10.8* 13.1* 16.8*  NEUTROABS  --  7.8*  --   --   --   HGB 7.3* 8.1* 7.3* 7.3* 8.4*  HCT 24.1* 25.9* 23.4* 22.8* 26.0*  MCV 94.5 93.2 92.9 89.8 88.1  PLT 311 359 351 352 367   Blood Culture    Component Value Date/Time   SDES BLOOD LEFT HAND 09/22/2019 0917   SPECREQUEST AEROBIC BOTTLE ONLY Blood Culture adequate volume 09/22/2019 0917   CULT  09/22/2019 0917    NO GROWTH < 24 HOURS Performed at [facility], [place] [address], [place], [place] [address]    REPTSTATUS PENDING 09/22/2019 4481   Iron Studies:  Recent Labs    09/21/19 0838  IRON 23*  TIBC 136*  FERRITIN 1,292*   Studies/Results: Ct Chest Wo Contrast  Result Date: 09/22/2019 CLINICAL DATA:  68 year old male  with shortness of breath and pneumonia. Patient was found to have COVID-19. Patient presenting with rales. EXAM: CT CHEST WITHOUT CONTRAST TECHNIQUE: Multidetector CT imaging of the chest was performed following the standard protocol without IV contrast. COMPARISON:  Chest CT dated 09/01/2019. FINDINGS: Evaluation of this exam is limited in the absence of intravenous contrast. Cardiovascular: There is mild cardiomegaly. No significant pericardial effusion. There is hypoattenuation of the cardiac blood pool suggestive of a degree of anemia. Clinical correlation is recommended. The thoracic aorta and central pulmonary arteries are grossly unremarkable. Mediastinum/Nodes: No hilar or mediastinal adenopathy. The esophagus is grossly unremarkable. Left IJ dialysis catheter with tip over central SVC. Lungs/Pleura: Small bilateral pleural effusions, increased since the prior CT. There has been interval removal of the right-sided chest tube with increased air within the right pleural space. A tract seen in the right lateral chest wall corresponding to the location of the removed chest tube. There appears to be communication of air between the pleural space and chest wall. This likely results in introduction of air into the right pleural space. Clinical correlation is recommended. Diffuse interstitial linear and streaky densities with mild bilateral bronchiectasis. Overall improvement in aeration of the lungs and decrease in the diffuse airspace ground-glass  density compared to the prior CT. A 5 x 6 cm cystic area in the right middle lobe has decreased in size since the prior CT (previously measuring approximately 10 x 8 cm). There has been interval removal of the tracheostomy. Upper Abdomen: A 1 cm hypodense lesion in the dome of the liver is not well characterized but likely represent cysts. There is metallic density in the distal stomach similar to prior CT which may represent surgical material or ingested foreign matter.  Musculoskeletal: No chest wall mass or suspicious bone lesions identified. IMPRESSION: 1. Interval removal of the right-sided chest tube with increased air within the right pleural space. There appears to be communication of air between the pleural space and chest wall along the track of the removed chest tube resulting in introduction of air into the right pleural space. Clinical correlation is recommended. 2. Interval decrease in the size of the right middle lobe cystic area compared to the prior CT. 3. Small bilateral pleural effusions, increased since the prior CT. 4. Overall improvement in the aeration of the lungs and diffuse airspace ground-glass density compared to the prior CT. 5. Mild cardiomegaly. 6. Aortic Atherosclerosis (ICD10-I70.0). Electronically Signed   By: Anner Crete M.D.   On: 09/22/2019 20:05   Medications: . sodium chloride Stopped (09/21/19 1659)  . feeding supplement (NEPRO CARB STEADY) Stopped (09/19/19 0148)  . ferric gluconate (FERRLECIT/NULECIT) IV 125 mg (09/20/19 1220)   . aspirin EC  81 mg Oral Daily  . atorvastatin  40 mg Oral q1800  . budesonide (PULMICORT) nebulizer solution  0.5 mg Nebulization BID  . chlorhexidine  15 mL Mouth/Throat BID  . Chlorhexidine Gluconate Cloth  6 each Topical Q0600  . darbepoetin (ARANESP) injection - DIALYSIS  100 mcg Intravenous Q Sat-HD  . heparin injection (subcutaneous)  5,000 Units Subcutaneous Q8H  . mouth rinse  15 mL Mouth Rinse 10 times per day  . multivitamin  1 tablet Oral QHS  . pantoprazole sodium  40 mg Per Tube Daily  . sucralfate  1 g Oral Q6H    Dialysis Orders: New start ESRD -> 4hr, 3K, TDC, 400/800, EDW pending.  Assessment/Plan: 1.S/p acute hypoxemia and respiratory failure due to COVID 19: Chest tube out, trach removed 11/30. Steady improvement. 2. ESRD:New start this admission, on TTS - for HD today. Making some urine - still dialysis dependent at this point.Will plan for permanent access once  patient is more stable. 3.HTN/volume:BPimproved on HD. Continue UF as tolerated - goal 3-3.5kg per treatment. 4. Anemia:Hgb 8.4. HxGI bleed this admission. No heparin with HD. Continue Aranesp and IV iron. 5. Secondary hyperparathyroidism:CorrCa slightly high. Not on calcium or vit D, use low Ca bath. Phos now within goal.Check iPTH with next HD. 6.CVA:Bilatcerebral cortex and right cerebellum small acute to subacute infarctswithneuromuscular weakness/encephalopathy. 6. Dispo: Lurline Idol out which is great - this will dramatically help ability to arrange outpatient HD. Still very debilitated, will need ongoing PT/OT.  Veneta Penton, PA-C 09/23/2019, 10:36 AM  [facility] Pager: [phone]

## 2019-09-24 ENCOUNTER — Inpatient Hospital Stay (HOSPITAL_COMMUNITY): Payer: Medicare HMO

## 2019-09-24 DIAGNOSIS — G4700 Insomnia, unspecified: Secondary | ICD-10-CM

## 2019-09-24 LAB — RENAL FUNCTION PANEL
Albumin: 1.7 g/dL — ABNORMAL LOW (ref 3.5–5.0)
Anion gap: 13 (ref 5–15)
BUN: 27 mg/dL — ABNORMAL HIGH (ref 8–23)
CO2: 25 mmol/L (ref 22–32)
Calcium: 8.6 mg/dL — ABNORMAL LOW (ref 8.9–10.3)
Chloride: 94 mmol/L — ABNORMAL LOW (ref 98–111)
Creatinine, Ser: 4.88 mg/dL — ABNORMAL HIGH (ref 0.61–1.24)
GFR calc Af Amer: 13 mL/min — ABNORMAL LOW (ref >60)
GFR calc non Af Amer: 11 mL/min — ABNORMAL LOW (ref >60)
Glucose, Bld: 79 mg/dL (ref 70–99)
Phosphorus: 4.3 mg/dL (ref 2.5–4.6)
Potassium: 4 mmol/L (ref 3.5–5.1)
Sodium: 132 mmol/L — ABNORMAL LOW (ref 135–145)

## 2019-09-24 LAB — CBC
HCT: 27.6 % — ABNORMAL LOW (ref 39.0–52.0)
Hemoglobin: 8.6 g/dL — ABNORMAL LOW (ref 13.0–17.0)
MCH: 28.3 pg (ref 26.0–34.0)
MCHC: 31.2 g/dL (ref 30.0–36.0)
MCV: 90.8 fL (ref 80.0–100.0)
Platelets: 350 10*3/uL (ref 150–400)
RBC: 3.04 MIL/uL — ABNORMAL LOW (ref 4.22–5.81)
RDW: 15.1 % (ref 11.5–15.5)
WBC: 14.9 10*3/uL — ABNORMAL HIGH (ref 4.0–10.5)
nRBC: 0 % (ref 0.0–0.2)

## 2019-09-24 LAB — GLUCOSE, CAPILLARY
Glucose-Capillary: 108 mg/dL — ABNORMAL HIGH (ref 70–99)
Glucose-Capillary: 78 mg/dL (ref 70–99)
Glucose-Capillary: 85 mg/dL (ref 70–99)
Glucose-Capillary: 96 mg/dL (ref 70–99)
Glucose-Capillary: 96 mg/dL (ref 70–99)
Glucose-Capillary: 99 mg/dL (ref 70–99)

## 2019-09-24 MED ORDER — CHLORHEXIDINE GLUCONATE CLOTH 2 % EX PADS
6.0000 | MEDICATED_PAD | Freq: Every day | CUTANEOUS | Status: DC
  Administered 2019-09-24 – 2019-09-29 (×6): 6 via TOPICAL

## 2019-09-24 MED ORDER — RAMELTEON 8 MG PO TABS
8.0000 mg | ORAL_TABLET | Freq: Every day | ORAL | Status: DC
  Administered 2019-09-24 – 2019-10-02 (×9): 8 mg via ORAL
  Filled 2019-09-24 (×10): qty 1

## 2019-09-24 MED ORDER — TRAZODONE HCL 50 MG PO TABS: 25.0000 mg | ORAL_TABLET | Freq: Two times a day (BID) | ORAL | Status: DC | PRN

## 2019-09-24 MED ORDER — ENSURE ENLIVE PO LIQD
237.0000 mL | Freq: Three times a day (TID) | ORAL | Status: DC
  Administered 2019-09-24 – 2019-09-25 (×3): 237 mL via ORAL

## 2019-09-24 NOTE — Progress Notes (Signed)
PROGRESS NOTE  [name] QIO:[id number] DOB: 06/02/1951   PCP: Kathyrn Drown, MD  Patient is from: Home  DOA: 07/25/2019 LOS: 53  Brief Narrative / Interim history: 68 year old male with PMH of GERD and BPH, initially presented on 07/25/2019 with worsening dyspnea after recently testing positive for COVID-19 on 07/22/2019.  He required emergent intubation at [facility] ED.  He was admitted to the [facility] hospital for acute hypoxic respiratory failure secondary to severe ARDS from Covid pneumonia and completed Decadron, Remdesivir and Tocilizumab.  Hospital course complicated by acute kidney injury requiring CRRT, encephalopathy, pneumothorax complicated by persistent air leak and bronchopleural fistula requiring endobronchial blocker and pleur-evac, prolonged mechanical ventilation s/p tracheostomy on 10/30, MSSA bacteremia with negative echocardiogram intermittent pressor support with dialysis, melena with acute blood loss anemia requiring transfusion, IV PPI, empiric octreotide, EGD showed gastric ulcer and nonbleeding visible vessel clipped, transferred to [facility] on 08[facility]26/2023 intermittent HD, subacute right cerebral/cerebellar infarcts on MRI.  PCCM following for tracheostomy management, weekly.  Nephrology on board for HD needs.  Permanent access pending improvement in his debility.  Subjective: No major events overnight of this morning.  He said he had a hard time sleeping last night.  He denies pain, shortness of breath, nausea, vomiting or abdominal pain.  Cough improved.  Denies UTI symptoms.  No new focal neuro symptoms.  Objective: Vitals:   09/23/19 2020 09/23/19 2036 09/24/19 0458 09/24/19 0820  BP:  129/74 (!) 154/74 (!) 155/84  Pulse:  (!) 106 (!) 104 (!) 106  Resp:  _0 Temp:  98 F (36.7 C) 98 F (36.7 C) 99.1 F (37.3 C)  TempSrc:    Oral  SpO2: 98% 99% 100% 98%  Weight:      Height:        Intake/Output Summary (Last 24 hours) at 09/24/2019  0957 Last data filed at 09/24/2019 3244 Gross per 24 hour  Intake 240 ml  Output 4300 ml  Net -4060 ml   Filed Weights   09/18/19 2136 09/20/19 1952 09/23/19 1700  Weight: 86.3 kg 86.5 kg 89.4 kg    Examination:  GENERAL: No acute distress.  Appears well.  HEENT: MMM.  Vision and hearing grossly intact.  NECK: Dressing over anterior neck clean, dry and intact. RESP:  No IWOB.  Fair air movement bilaterally.  Rhonchi bilaterally. CVS:  RRR. Heart sounds normal.  ABD/GI/GU: Bowel sounds present. Soft. Non tender.  MSK/EXT:  No apparent deformity or edema.  Left hemiparesis. SKIN: Stage II sacral decubitus ulcer as below. NEURO: Awake, alert and oriented fairly.  Left hemiparesis. PSYCH: Calm. Normal affect.  Assessment & Plan: Acute hypoxic respiratory failure due to ARDS from Covid pneumonia status post trach Right pneumothorax/empyema/necrotic RLL/bronchopleural fistula with large air leak Pseudomonas and Acinetobacter in BAL: completed 10 days course of meropenem. -COVID-19 positive on 07/13/2019. S/p remdesivir, Decadron, Actemra and convalescent plasma therapy -ARDS resolved.  COVID-19 negative x2 on 08/24/19 and 08/25/19. -Chest tube removed by CTS on 11/16.  Follow-up serial CXRs without new pneumonia -Decannulated by [facility] 11/30.  Stable since then. -Wean oxygen  Fever: spiked fever to 101 on 11/29.   Per ID, could be due to post Covid inflammatory process but need to exclude other possibilities, and observe off antibiotics. CT chest on 11/30 without acute/new finding to explain fever.  Blood cultures on 11/30 negative so far. -Trend leukocytosis and fever curve. -Follow ID recs.  Cough: concern about silent aspiration. CT chest as  above.  Completed 7 days of doxycycline.  Improved. -As needed breathing treatments and antitussives  CKD-3 progressed to ESRD: now on HD TTS Bone mineral disorder -Nephrology managing -HD 11/21>> cannot do permanent access or OP HD due to  debility/deconditioning  Acute on chronic anemia: Multifactorial-blood loss from gastric ulcer, renal disease and acute illness.  Had EGD with clipping of culprit vessel.   -Hgb 13.2 (admit)>>>> multiple intermittent transfusions>> 7.3>1u> 8.4>8.6 -Aranesp and iron supplementation per nephrology -Monitor H&H  H. pylori infection Per previous attending, GI, Dr. Cristina Gong to follow up in 2-3 months from hospitalization to arrange 2-week treatment and possible f/u EGD.  CVA with left hemiparesis and dysphagia: small bilateral cerebral and right cerebellar infarct on 11/3.  -No anticoagulation given GI bleed.   -EC aspirin 81 mg daily after discussion with neurology, Dr. Erlinda Hong on 11/28.   -Will discuss with GI about Plavix after final decision about permanent access for HD -Continue statin.  Dysphagia: cortrak clogged and removed 11/30. -SLP upgraded to dysphagia-2 diet and nectar thickened liquids  -Appreciate guidance by dietitian.  Acute metabolic encephalopathy/anoxic brain injury: concern on MRI brain on 11/3.  Encephalopathy resolved. -Continue monitoring and delirium precautions  Paroxysmal A. Fib: Now in sinus rhythm without rate or rhythm control medication. -Not on anticoagulation due to GI bleed  MSSA bacteremia: TTE negative.  Did not get TEE.  Completed 6 weeks of antibiotic course with Ancef on 11/23.  Elevated D-dimer: Likely due to acute illness as above, particularly Covid.  Dopplers negative for DVT on 10/8.   Physical deconditioning/severe debility -Ongoing PT/OT/SLP-therapy recommending CIR  Insomnia: -Ramelteon nightly.  If no response, as needed trazodone  Pressure Injury 08/24/19 Sacrum Right;Left;Mid Stage II -  Partial thickness loss of dermis presenting as a shallow open ulcer with a red, pink wound bed without slough.  (Active)  08/24/19 0800  Location: Sacrum  Location Orientation: Right;Left;Mid  Staging: Stage II -  Partial thickness loss of dermis  presenting as a shallow open ulcer with a red, pink wound bed without slough.  Wound Description (Comments): -- (stage 11 mid sacrum)  Present on Admission: No     Nutrition Problem: Increased nutrient needs Etiology: chronic illness  Signs/Symptoms: estimated needs  Interventions: Magic cup, MVI, Tube feeding, Calorie Count, Refer to RD note for recommendations   DVT prophylaxis: SCD Code Status: DNR/DNI Family Communication: Patient and/or RN. Available if any question. Disposition Plan: Remains inpatient.  Therapy recommending CIR for final disposition Consultants: Neurology (signed off), GI (signed off), PCCM, nephrology  Procedures:  10/02 ETT>10/30 10/03 Admit to [place] from [facility] ER, start decadron, remdesivir,  tociluzimab and convalescent plasma 10/10 MSSA bacteremia, CVL d/ced; ID consulted; increase OG tube outpt 10/11 vent weaning trial started 10/13 fever, pneumothorax, pig tail chest tube placed 10/14 renal failure 10/15 CRRT 10/17 EGD- gastric ulcer, nonbleeding visible vessel clipped 10/30 tracheostomy  11/1 D/C CRRT 11/2 Transfer to [facility] 11/3 Started on IHD 11/4 weaning on ATC 11/10 tunneled HD cath 11/16 Chest tube removed  11/30 decannulated.  Microbiology summarized: 10/20-COVID-19 positive. 10/2-blood cultures negative 10/3, 10/10-MRSA PCR negative 10/9-blood culture MSSA 10/9-urine culture insignificant growth. 10/9-respiratory culture with moderate staph aureus, rare Pseudomonas 10/13-blood cultures negative x5-day 10/21-BAL with Pseudomonas and Acinetobacter 10/21 and 10/24 BAL fungal cultures negative 10/28-sputum culture with Pseudomonas 10/28-blood cultures negative 11/1 and 11/2-COVID-19 negative 11/30-blood cultures negative so far   Sch Meds:  Scheduled Meds: . aspirin EC  81 mg Oral Daily  . atorvastatin  40  mg Oral q1800  . budesonide (PULMICORT) nebulizer solution  0.5 mg Nebulization BID  . chlorhexidine  15 mL Mouth/Throat BID  .  Chlorhexidine Gluconate Cloth  6 each Topical Q0600  . darbepoetin (ARANESP) injection - DIALYSIS  100 mcg Intravenous Q Sat-HD  . heparin injection (subcutaneous)  5,000 Units Subcutaneous Q8H  . mouth rinse  15 mL Mouth Rinse 10 times per day  . multivitamin  1 tablet Oral QHS  . pantoprazole sodium  40 mg Per Tube Daily  . sucralfate  1 g Oral Q6H   Continuous Infusions: . sodium chloride Stopped (09/21/19 1659)  . feeding supplement (NEPRO CARB STEADY) Stopped (09/19/19 0148)  . ferric gluconate (FERRLECIT/NULECIT) IV 125 mg (09/20/19 1220)   PRN Meds:.sodium chloride, acetaminophen (TYLENOL) oral liquid 160 mg/5 mL, guaiFENesin-dextromethorphan, ipratropium-albuterol, ondansetron (ZOFRAN) IV, pneumococcal 23 valent vaccine, polyethylene glycol, polyvinyl alcohol, Resource ThickenUp Clear, Resource ThickenUp Clear, white petrolatum  Antimicrobials: Anti-infectives (From admission, onward)   Start     Dose/Rate Route Frequency Ordered Stop   09/16/19 1700  doxycycline (VIBRAMYCIN) 100 mg in sodium chloride 0.9 % 250 mL IVPB  Status:  Discontinued     100 mg 125 mL/hr over 120 Minutes Intravenous Every 12 hours 09/16/19 1540 09/22/19 1840   08/26/19 2200  ceFAZolin (ANCEF) IVPB 1 g/50 mL premix     1 g 100 mL/hr over 30 Minutes Intravenous Every 24 hours 08/24/19 1021 09/15/19 2202   08/24/19 1100  meropenem (MERREM) 1 g in sodium chloride 0.9 % 100 mL IVPB     1 g 200 mL/hr over 30 Minutes Intravenous Every 24 hours 08/24/19 1021 08/25/19 1156   08/23/19 1100  ceFAZolin (ANCEF) IVPB 2g/100 mL premix  Status:  Discontinued     2 g 200 mL/hr over 30 Minutes Intravenous Every 12 hours 08/23/19 1048 08/24/19 1021   08/21/19 1700  vancomycin (VANCOCIN) IVPB 750 mg/150 ml premix  Status:  Discontinued     750 mg 150 mL/hr over 60 Minutes Intravenous Every 24 hours 08/21/19 0843 08/23/19 1048   08/21/19 1400  vancomycin (VANCOCIN) IVPB 750 mg/150 ml premix  Status:  Discontinued     750  mg 150 mL/hr over 60 Minutes Intravenous Every 24 hours 08/20/19 1146 08/20/19 1218   08/21/19 1000  meropenem (MERREM) 1 g in sodium chloride 0.9 % 100 mL IVPB  Status:  Discontinued     1 g 200 mL/hr over 30 Minutes Intravenous Every 12 hours 08/20/19 1535 08/23/19 1048   08/21/19 0413  meropenem (MERREM) 500 mg in sodium chloride 0.9 % 100 mL IVPB  Status:  Discontinued     500 mg 200 mL/hr over 30 Minutes Intravenous Every 24 hours 08/20/19 1245 08/20/19 1535   08/20/19 1330  vancomycin (VANCOCIN) 1,750 mg in sodium chloride 0.9 % 500 mL IVPB     1,750 mg 250 mL/hr over 120 Minutes Intravenous  Once 08/20/19 1222 08/20/19 1619   08/20/19 1300  vancomycin (VANCOCIN) 1,250 mg in sodium chloride 0.9 % 250 mL IVPB  Status:  Discontinued     1,250 mg 166.7 mL/hr over 90 Minutes Intravenous  Once 08/20/19 1146 08/20/19 1218   08/20/19 1224  vancomycin variable dose per unstable renal function (pharmacist dosing)  Status:  Discontinued      Does not apply See admin instructions 08/20/19 1225 08/21/19 1152   08/15/19 1600  meropenem (MERREM) 1 g in sodium chloride 0.9 % 100 mL IVPB  Status:  Discontinued  1 g 200 mL/hr over 30 Minutes Intravenous Every 12 hours 08/15/19 1543 08/20/19 1245   08/15/19 1000  voriconazole (VFEND) tablet 200 mg  Status:  Discontinued     200 mg Per Tube Every 12 hours 08/14/19 1148 08/15/19 1134   08/14/19 1600  anidulafungin (ERAXIS) 100 mg in sodium chloride 0.9 % 100 mL IVPB  Status:  Discontinued     100 mg 78 mL/hr over 100 Minutes Intravenous Every 24 hours 08/13/19 1645 08/14/19 1148   08/14/19 1300  voriconazole (VFEND) tablet 400 mg     400 mg Per Tube Every 12 hours 08/14/19 1148 08/14/19 2217   08/13/19 1700  anidulafungin (ERAXIS) 200 mg in sodium chloride 0.9 % 200 mL IVPB     200 mg 78 mL/hr over 200 Minutes Intravenous  Once 08/13/19 1645 08/13/19 2030   08/13/19 0200  ceFAZolin (ANCEF) IVPB 2g/100 mL premix  Status:  Discontinued     2 g 200  mL/hr over 30 Minutes Intravenous Every 12 hours 08/11/19 1512 08/15/19 1543   08/12/19 0200  ceFEPIme (MAXIPIME) 2 g in sodium chloride 0.9 % 100 mL IVPB     2 g 200 mL/hr over 30 Minutes Intravenous Every 12 hours 08/11/19 1509 08/12/19 1341   08/07/19 2200  ceFEPIme (MAXIPIME) 2 g in sodium chloride 0.9 % 100 mL IVPB  Status:  Discontinued     2 g 200 mL/hr over 30 Minutes Intravenous Every 12 hours 08/07/19 1540 08/11/19 1509   08/07/19 1000  anidulafungin (ERAXIS) 100 mg in sodium chloride 0.9 % 100 mL IVPB  Status:  Discontinued     100 mg 78 mL/hr over 100 Minutes Intravenous Every 24 hours 08/06/19 0934 08/08/19 1203   08/07/19 0800  ceFEPIme (MAXIPIME) 2 g in sodium chloride 0.9 % 100 mL IVPB  Status:  Discontinued     2 g 200 mL/hr over 30 Minutes Intravenous Every 24 hours 08/06/19 1036 08/07/19 1540   08/06/19 1000  anidulafungin (ERAXIS) 200 mg in sodium chloride 0.9 % 200 mL IVPB    Note to Pharmacy: please   200 mg 78 mL/hr over 200 Minutes Intravenous Every 24 hours 08/06/19 0925 08/06/19 1600   08/06/19 0800  ceFEPIme (MAXIPIME) 2 g in sodium chloride 0.9 % 100 mL IVPB  Status:  Discontinued     2 g 200 mL/hr over 30 Minutes Intravenous Every 12 hours 08/06/19 0733 08/06/19 1036   08/02/19 2200  ceFAZolin (ANCEF) IVPB 2g/100 mL premix  Status:  Discontinued     2 g 200 mL/hr over 30 Minutes Intravenous Every 8 hours 08/02/19 1338 08/06/19 0733   08/02/19 1400  vancomycin (VANCOCIN) 1,250 mg in sodium chloride 0.9 % 250 mL IVPB  Status:  Discontinued     1,250 mg 166.7 mL/hr over 90 Minutes Intravenous Every 24 hours 08/01/19 1320 08/02/19 1332   08/01/19 1330  vancomycin (VANCOCIN) 2,000 mg in sodium chloride 0.9 % 500 mL IVPB     2,000 mg 250 mL/hr over 120 Minutes Intravenous  Once 08/01/19 1158 08/01/19 1636   08/01/19 1300  ceFEPIme (MAXIPIME) 2 g in sodium chloride 0.9 % 100 mL IVPB  Status:  Discontinued     2 g 200 mL/hr over 30 Minutes Intravenous Every 12  hours 08/01/19 1158 08/02/19 1332   07/26/19 1600  remdesivir 100 mg in sodium chloride 0.9 % 250 mL IVPB     100 mg 500 mL/hr over 30 Minutes Intravenous Every 24 hours 07/26/19 0132 07/29/19  1823   07/26/19 0215  remdesivir 200 mg in sodium chloride 0.9 % 250 mL IVPB     200 mg 500 mL/hr over 30 Minutes Intravenous Once 07/26/19 0132 07/26/19 0520       I have personally reviewed the following labs and images: CBC: Recent Labs  Lab 09/19/19 1548 09/21/19 0551 09/22/19 0401 09/23/19 0714 09/24/19 0735  WBC 11.5* 10.8* 13.1* 16.8* 14.9*  NEUTROABS 7.8*  --   --   --   --   HGB 8.1* 7.3* 7.3* 8.4* 8.6*  HCT 25.9* 23.4* 22.8* 26.0* 27.6*  MCV 93.2 92.9 89.8 88.1 90.8  PLT 359 351 352 367 350   BMP &GFR Recent Labs  Lab 09/19/19 1548 09/21/19 0551 09/22/19 0401 09/23/19 0714 09/24/19 0503  NA 132* 133* 130* 129* 132*  K 4.4 4.0 4.2 4.5 4.0  CL 95* 97* 94* 95* 94*  CO2 _0 20* 25  GLUCOSE 96 93 87 89 79  BUN 44* 25* 30* 37* 27*  CREATININE 4.95* 3.79* 5.04* 6.15* 4.88*  CALCIUM 8.8* 8.7* 8.8* 8.8* 8.6*  MG  --   --  1.6* 1.9  --   PHOS 3.0 3.0 4.1 4.8* 4.3   Estimated Creatinine Clearance: 16.8 mL/min (A) (by C-G formula based on SCr of 4.88 mg/dL (H)). Liver & Pancreas: Recent Labs  Lab 09/18/19 1411 09/19/19 1548 09/21/19 0551 09/22/19 0401 09/24/19 0503  ALBUMIN 1.8* 1.9* 1.7* 1.7* 1.7*   No results for input(s): LIPASE, AMYLASE in the last 168 hours. No results for input(s): AMMONIA in the last 168 hours. Diabetic: No results for input(s): HGBA1C in the last 72 hours. Recent Labs  Lab 09/23/19 1733 09/23/19 2036 09/24/19 0018 09/24/19 0456 09/24/19 0732  GLUCAP 89 87 96 78 85   Cardiac Enzymes: No results for input(s): CKTOTAL, CKMB, CKMBINDEX, TROPONINI in the last 168 hours. No results for input(s): PROBNP in the last 8760 hours. Coagulation Profile: No results for input(s): INR, PROTIME in the last 168 hours. Thyroid Function Tests:  No results for input(s): TSH, T4TOTAL, FREET4, T3FREE, THYROIDAB in the last 72 hours. Lipid Profile: No results for input(s): CHOL, HDL, LDLCALC, TRIG, CHOLHDL, LDLDIRECT in the last 72 hours. Anemia Panel: No results for input(s): VITAMINB12, FOLATE, FERRITIN, TIBC, IRON, RETICCTPCT in the last 72 hours. Urine analysis:    Component Value Date/Time   COLORURINE YELLOW 08/01/2019 1745   APPEARANCEUR HAZY (A) 08/01/2019 1745   LABSPEC 1.023 08/01/2019 1745   PHURINE 5.0 08/01/2019 1745   GLUCOSEU NEGATIVE 08/01/2019 1745   HGBUR NEGATIVE 08/01/2019 1745   BILIRUBINUR NEGATIVE 08/01/2019 1745   KETONESUR NEGATIVE 08/01/2019 1745   PROTEINUR NEGATIVE 08/01/2019 1745   NITRITE NEGATIVE 08/01/2019 1745   LEUKOCYTESUR NEGATIVE 08/01/2019 1745   Sepsis Labs: Invalid input(s): PROCALCITONIN, [place]  Microbiology: Recent Results (from the past 240 hour(s))  Culture, blood (routine x 2)     Status: None (Preliminary result)   Collection Time: 09/22/19  9:10 AM   Specimen: BLOOD RIGHT HAND  Result Value Ref Range Status   Specimen Description BLOOD RIGHT HAND  Final   Special Requests AEROBIC BOTTLE ONLY Blood Culture adequate volume  Final   Culture   Final    NO GROWTH 2 DAYS Performed at [facility], [address], [place], [place] [address]    Report Status PENDING  Incomplete  Culture, blood (routine x 2)     Status: None (Preliminary result)   Collection Time: 09/22/19  9:17  AM   Specimen: BLOOD LEFT HAND  Result Value Ref Range Status   Specimen Description BLOOD LEFT HAND  Final   Special Requests AEROBIC BOTTLE ONLY Blood Culture adequate volume  Final   Culture   Final    NO GROWTH 2 DAYS Performed at [facility], [address] [place], [place] [address]    Report Status PENDING  Incomplete    Radiology Studies: No results found.   Lothar Prehn T. Jeff  If 7PM-7AM, please contact night-coverage [email or web address] Password [facility]  09/24/2019, 9:57 AM

## 2019-09-24 NOTE — Progress Notes (Signed)
Modified Barium Swallow Progress Note  Patient Details  Name: [name] MRN: [id number] Date of Birth: [birth year]  Today's Date: 09/24/2019  Modified Barium Swallow completed.  Full report located under Chart Review in the Imaging Section.  Brief recommendations include the following:  Clinical Impression  Pt demonstrates a mild oral dysphagia characterized by decreased bolus cohesion and inefficient ligual propulsion of bolus leading to slow mastication and 2-3 swallow per bolus. Introductory verbal cues given to encourage bolus formation during testing with slight improvement from pt, further instruction may be helpful as strength seems to be adequate. Pharyngeal phase WNL with all textures. Pt may resume a regular texture diet to encourage intake though extra time may be needed at meals. Thin liquids also recommended. The probability that silent aspiration could be causing temp spike would be very low given pts excellent function today. Will follow for ongoing interventions addressing oral phase and cognitive goals.    Swallow Evaluation Recommendations       SLP Diet Recommendations: Regular solids;Thin liquid   Liquid Administration via: Cup;Straw   Medication Administration: Whole meds with liquid   Supervision: Staff to assist with self feeding   Compensations: Slow rate;Small sips/bites   Postural Changes: Seated upright at 90 degrees   Oral Care Recommendations: Oral care BID     [name], MA [place] Pager [phone] Office [phone]    Lynann Beaver 09/24/2019,10:05 AM

## 2019-09-24 NOTE — Progress Notes (Signed)
Calorie Count Note  48 hour calorie count ordered.  Diet: DYS 2, nectar thick liquids  Supplements: Magic Cup TID  Day 1- 11/30 Breakfast: refused  Lunch: 563 kcal, 21 g protein Dinner: 129 kcal, 6 g protein  Supplements: none  Total intake: 692 kcal (31% of minimum estimated needs)  27 protein (16% of minimum estimated needs)  Day 2- 12/1 Breakfast: 171 kcal, 9 g protein  Lunch: refused  Dinner: 206 kcal, 10 g protein  Supplements: 290 kcal, 9 g protein   Total intake: 667 kcal (30% of minimum estimated needs)  28 protein (22% of minimum estimated needs)  Estimated Nutritional Needs:  Kcal:  2200-2400 Protein:  130-150 gm Fluid:  1000 mL plus UOP  Nutrition Dx: Increased nutrient needs related to chronic illness as evidenced by estimated needs- Ongoing   Goal: Patient will meet greater than or equal to 90% of their needs- Not meeting   Intervention:   Magic cup TID with meals, each supplement provides 290 kcal and 9 grams of protein, renal MVI daily  RD to add Ensure Enlive po TID, each supplement provides 350 kcal and 20 grams of protein  Diet just advanced to regular with thin liquids. Will monitor intake until Friday. If no progression recommend replacing Cortrak Friday.   [name] RD, LDN Clinical Nutrition Pager # [phone]

## 2019-09-24 NOTE — Progress Notes (Signed)
[facility] Progress Note   Subjective:  Seen in hallway in route to therapy. No CP/dyspnea today. Did ok with HD yesterday - 4L net UF. Minimal urine in foley bag today.  Objective Vitals:   09/23/19 2020 09/23/19 2036 09/24/19 0458 09/24/19 0820  BP:  129/74 (!) 154/74 (!) 155/84  Pulse:  (!) 106 (!) 104 (!) 106  Resp:  18 18 18   Temp:  98 F (36.7 C) 98 F (36.7 C) 99.1 F (37.3 C)  TempSrc:    Oral  SpO2: 98% 99% 100% 98%  Weight:      Height:       Physical Exam General:NAD, nasal O2 in place. ENT: NG and trach out Heart:RRR, no murmur Lungs:CTA anteriorly Abdomen:Soft, non-distended, +BS Extremities:Trace LLE edema, some LUE edema as well Dialysis Access:L IJ [facility]  Additional Objective Labs: Basic Metabolic Panel: Recent Labs  Lab 09/22/19 0401 09/23/19 0714 09/24/19 0503  NA 130* 129* 132*  K 4.2 4.5 4.0  CL 94* 95* 94*  CO2 26 20* 25  GLUCOSE 87 89 79  BUN 30* 37* 27*  CREATININE 5.04* 6.15* 4.88*  CALCIUM 8.8* 8.8* 8.6*  PHOS 4.1 4.8* 4.3   Liver Function Tests: Recent Labs  Lab 09/21/19 0551 09/22/19 0401 09/24/19 0503  ALBUMIN 1.7* 1.7* 1.7*   CBC: Recent Labs  Lab 09/19/19 1548 09/21/19 0551 09/22/19 0401 09/23/19 0714 09/24/19 0735  WBC 11.5* 10.8* 13.1* 16.8* 14.9*  NEUTROABS 7.8*  --   --   --   --   HGB 8.1* 7.3* 7.3* 8.4* 8.6*  HCT 25.9* 23.4* 22.8* 26.0* 27.6*  MCV 93.2 92.9 89.8 88.1 90.8  PLT 359 351 352 367 350   Blood Culture    Component Value Date/Time   SDES BLOOD LEFT HAND 09/22/2019 0917   SPECREQUEST AEROBIC BOTTLE ONLY Blood Culture adequate volume 09/22/2019 0917   CULT  09/22/2019 0917    NO GROWTH 2 DAYS Performed at [facility] [address] [place], [place] [address]    REPTSTATUS PENDING 09/22/2019 0354   Studies/Results: Ct Chest Wo Contrast  Result Date: 09/22/2019 CLINICAL DATA:  68 year old male with shortness of breath and pneumonia. Patient was found to have  COVID-19. Patient presenting with rales. EXAM: CT CHEST WITHOUT CONTRAST TECHNIQUE: Multidetector CT imaging of the chest was performed following the standard protocol without IV contrast. COMPARISON:  Chest CT dated 09/01/2019. FINDINGS: Evaluation of this exam is limited in the absence of intravenous contrast. Cardiovascular: There is mild cardiomegaly. No significant pericardial effusion. There is hypoattenuation of the cardiac blood pool suggestive of a degree of anemia. Clinical correlation is recommended. The thoracic aorta and central pulmonary arteries are grossly unremarkable. Mediastinum/Nodes: No hilar or mediastinal adenopathy. The esophagus is grossly unremarkable. Left IJ dialysis catheter with tip over central SVC. Lungs/Pleura: Small bilateral pleural effusions, increased since the prior CT. There has been interval removal of the right-sided chest tube with increased air within the right pleural space. A tract seen in the right lateral chest wall corresponding to the location of the removed chest tube. There appears to be communication of air between the pleural space and chest wall. This likely results in introduction of air into the right pleural space. Clinical correlation is recommended. Diffuse interstitial linear and streaky densities with mild bilateral bronchiectasis. Overall improvement in aeration of the lungs and decrease in the diffuse airspace ground-glass density compared to the prior CT. A 5 x 6 cm cystic area in the  right middle lobe has decreased in size since the prior CT (previously measuring approximately 10 x 8 cm). There has been interval removal of the tracheostomy. Upper Abdomen: A 1 cm hypodense lesion in the dome of the liver is not well characterized but likely represent cysts. There is metallic density in the distal stomach similar to prior CT which may represent surgical material or ingested foreign matter. Musculoskeletal: No chest wall mass or suspicious bone lesions  identified. IMPRESSION: 1. Interval removal of the right-sided chest tube with increased air within the right pleural space. There appears to be communication of air between the pleural space and chest wall along the track of the removed chest tube resulting in introduction of air into the right pleural space. Clinical correlation is recommended. 2. Interval decrease in the size of the right middle lobe cystic area compared to the prior CT. 3. Small bilateral pleural effusions, increased since the prior CT. 4. Overall improvement in the aeration of the lungs and diffuse airspace ground-glass density compared to the prior CT. 5. Mild cardiomegaly. 6. Aortic Atherosclerosis (ICD10-I70.0). Electronically Signed   By: Anner Crete M.D.   On: 09/22/2019 20:05   Medications: . sodium chloride Stopped (09/21/19 1659)  . feeding supplement (NEPRO CARB STEADY) Stopped (09/19/19 0148)  . ferric gluconate (FERRLECIT/NULECIT) IV 125 mg (09/20/19 1220)   . aspirin EC  81 mg Oral Daily  . atorvastatin  40 mg Oral q1800  . budesonide (PULMICORT) nebulizer solution  0.5 mg Nebulization BID  . chlorhexidine  15 mL Mouth/Throat BID  . Chlorhexidine Gluconate Cloth  6 each Topical Q0600  . darbepoetin (ARANESP) injection - DIALYSIS  100 mcg Intravenous Q Sat-HD  . heparin injection (subcutaneous)  5,000 Units Subcutaneous Q8H  . mouth rinse  15 mL Mouth Rinse 10 times per day  . multivitamin  1 tablet Oral QHS  . pantoprazole sodium  40 mg Per Tube Daily  . sucralfate  1 g Oral Q6H    Dialysis Orders: New start ESRD-> 4hr, 3K, TDC, 400/800, EDW pending.  Assessment/Plan: 1.S/p acute hypoxemia and respiratory failure due to COVID 19: Chest tube out, trach removed 11/30. Steady improvement. 2. ESRD:New start this admission, on TTS -  next tomorrow. Making some urine - still dialysis dependent at this point.Will plan for permanent access once patient is more stable. 3.HTN/volume:BP high today.  ContinueUF as tolerated - goal3-3.5kg per treatment. 4. Anemia:Hgb 8.6. HxGI bleed this admission. No heparin with HD.Continue Aranesp and IV iron. 5. Secondary hyperparathyroidism:CorrCa slightly high. Not on calcium or vit D, use low Ca bath. Phos now within goal.Check iPTH with next HD. 6.CVA:Bilatcerebral cortex and right cerebellum small acute to subacuteinfarctswithneuromuscular weakness/encephalopathy. 6. Dispo: Lurline Idol out which is great - this will dramatically help ability to arrange outpatient HD. Still very debilitated, will need ongoing PT/OT.  Veneta Penton, PA-C 09/24/2019, 9:07 AM  [facility] Pager: [phone]

## 2019-09-25 DIAGNOSIS — Z960 Presence of urogenital implants: Secondary | ICD-10-CM

## 2019-09-25 DIAGNOSIS — E46 Unspecified protein-calorie malnutrition: Secondary | ICD-10-CM

## 2019-09-25 DIAGNOSIS — Z8619 Personal history of other infectious and parasitic diseases: Secondary | ICD-10-CM

## 2019-09-25 DIAGNOSIS — R531 Weakness: Secondary | ICD-10-CM

## 2019-09-25 DIAGNOSIS — F329 Major depressive disorder, single episode, unspecified: Secondary | ICD-10-CM

## 2019-09-25 DIAGNOSIS — R159 Full incontinence of feces: Secondary | ICD-10-CM

## 2019-09-25 DIAGNOSIS — Z8701 Personal history of pneumonia (recurrent): Secondary | ICD-10-CM

## 2019-09-25 DIAGNOSIS — Z8719 Personal history of other diseases of the digestive system: Secondary | ICD-10-CM

## 2019-09-25 LAB — CBC
HCT: 22.9 % — ABNORMAL LOW (ref 39.0–52.0)
HCT: 25.9 % — ABNORMAL LOW (ref 39.0–52.0)
Hemoglobin: 7.3 g/dL — ABNORMAL LOW (ref 13.0–17.0)
Hemoglobin: 8.2 g/dL — ABNORMAL LOW (ref 13.0–17.0)
MCH: 28.5 pg (ref 26.0–34.0)
MCH: 28.7 pg (ref 26.0–34.0)
MCHC: 31.9 g/dL (ref 30.0–36.0)
MCHC: 31.7 g/dL (ref 30.0–36.0)
MCV: 89.5 fL (ref 80.0–100.0)
MCV: 90.6 fL (ref 80.0–100.0)
Platelets: 349 10*3/uL (ref 150–400)
Platelets: 366 10*3/uL (ref 150–400)
RBC: 2.56 MIL/uL — ABNORMAL LOW (ref 4.22–5.81)
RBC: 2.86 MIL/uL — ABNORMAL LOW (ref 4.22–5.81)
RDW: 14.6 % (ref 11.5–15.5)
RDW: 14.5 % (ref 11.5–15.5)
WBC: 13.3 10*3/uL — ABNORMAL HIGH (ref 4.0–10.5)
WBC: 14.7 10*3/uL — ABNORMAL HIGH (ref 4.0–10.5)
nRBC: 0 % (ref 0.0–0.2)
nRBC: 0 % (ref 0.0–0.2)

## 2019-09-25 LAB — RENAL FUNCTION PANEL
Albumin: 1.7 g/dL — ABNORMAL LOW (ref 3.5–5.0)
Anion gap: 12 (ref 5–15)
BUN: 34 mg/dL — ABNORMAL HIGH (ref 8–23)
CO2: 26 mmol/L (ref 22–32)
Calcium: 8.9 mg/dL (ref 8.9–10.3)
Chloride: 93 mmol/L — ABNORMAL LOW (ref 98–111)
Creatinine, Ser: 5.9 mg/dL — ABNORMAL HIGH (ref 0.61–1.24)
GFR calc Af Amer: 11 mL/min — ABNORMAL LOW (ref >60)
GFR calc non Af Amer: 9 mL/min — ABNORMAL LOW (ref >60)
Glucose, Bld: 95 mg/dL (ref 70–99)
Phosphorus: 4.1 mg/dL (ref 2.5–4.6)
Potassium: 4 mmol/L (ref 3.5–5.1)
Sodium: 131 mmol/L — ABNORMAL LOW (ref 135–145)

## 2019-09-25 LAB — GLUCOSE, CAPILLARY
Glucose-Capillary: 108 mg/dL — ABNORMAL HIGH (ref 70–99)
Glucose-Capillary: 128 mg/dL — ABNORMAL HIGH (ref 70–99)
Glucose-Capillary: 135 mg/dL — ABNORMAL HIGH (ref 70–99)
Glucose-Capillary: 84 mg/dL (ref 70–99)
Glucose-Capillary: 90 mg/dL (ref 70–99)

## 2019-09-25 LAB — OCCULT BLOOD X 1 CARD TO LAB, STOOL: Fecal Occult Bld: NEGATIVE

## 2019-09-25 LAB — HEPATITIS B SURFACE ANTIGEN: Hepatitis B Surface Ag: NONREACTIVE

## 2019-09-25 MED ORDER — BOOST / RESOURCE BREEZE PO LIQD CUSTOM
1.0000 | Freq: Three times a day (TID) | ORAL | Status: DC
  Administered 2019-09-25 – 2019-10-01 (×14): 1 via ORAL

## 2019-09-25 MED ORDER — HEPARIN SODIUM (PORCINE) 1000 UNIT/ML IJ SOLN
INTRAMUSCULAR | Status: AC
  Administered 2019-09-25: 3800 [IU]
  Filled 2019-09-25: qty 4

## 2019-09-25 MED ORDER — ASPIRIN EC 81 MG PO TBEC
81.0000 mg | DELAYED_RELEASE_TABLET | Freq: Every day | ORAL | Status: DC
  Administered 2019-09-25 – 2019-10-03 (×9): 81 mg via ORAL
  Filled 2019-09-25 (×9): qty 1

## 2019-09-25 NOTE — Progress Notes (Signed)
PROGRESS NOTE  [name] JYN:[id number] DOB: Oct 13, 1951   PCP: Kathyrn Drown, MD  Patient is from: Home  DOA: 07/25/2019 LOS: 62  Brief Narrative / Interim history: 68 year old male with PMH of GERD and BPH, initially presented on 07/25/2019 with worsening dyspnea after recently testing positive for COVID-19 on 07/22/2019.  He required emergent intubation at [facility] ED.  He was admitted to the [facility] hospital for acute hypoxic respiratory failure secondary to severe ARDS from Covid pneumonia and completed Decadron, Remdesivir and Tocilizumab.  Hospital course complicated by acute kidney injury requiring CRRT, encephalopathy, pneumothorax complicated by persistent air leak and bronchopleural fistula requiring endobronchial blocker and pleur-evac, prolonged mechanical ventilation s/p tracheostomy on 10/30, MSSA bacteremia with negative echocardiogram intermittent pressor support with dialysis, melena with acute blood loss anemia requiring transfusion, IV PPI, empiric octreotide, EGD showed gastric ulcer and nonbleeding visible vessel clipped, transferred to [facility] on 08/26/2023 intermittent HD, subacute right cerebral/cerebellar infarcts on MRI.  PCCM following for tracheostomy management, weekly.  Nephrology on board for HD needs.  Permanent access pending improvement in his debility.  Subjective: No major events overnight of this morning.  Has no complaint this morning.  Reports improvement in his cough.  Denies chest pain, dyspnea, palpitation or dizziness.  Denies nausea, vomiting or abdominal pain.  He tells me he had better p.o. intake yesterday.  Objective: Vitals:   09/25/19 0900 09/25/19 0930 09/25/19 1000 09/25/19 1030  BP: 135/81 134/76 (!) 143/75 (!) 144/82  Pulse: 99 (!) 101 100 100  Resp:      Temp:      TempSrc:      SpO2:      Weight:      Height:        Intake/Output Summary (Last 24 hours) at 09/25/2019 1141 Last data filed at 09/25/2019 1133  Gross per 24 hour  Intake 490.04 ml  Output 200 ml  Net 290.04 ml   Filed Weights   09/20/19 1952 09/23/19 1700 09/25/19 0650  Weight: 86.5 kg 89.4 kg 90.3 kg    Examination:  GENERAL: No acute distress.  Appears well.  HEENT: MMM.  Vision and hearing grossly intact.  NECK: Dressing over anterior neck clean, dry and intact. RESP:  No IWOB.  Fair air movement bilaterally.  Rhonchi bilaterally. CVS:  RRR. Heart sounds normal.  ABD/GI/GU: Bowel sounds present. Soft. Non tender.  MSK/EXT:  No apparent deformity or edema.  Left hemiparesis. SKIN: Stage II sacral decubitus ulcer as below. NEURO: Awake, alert and oriented fairly.  Left hemiparesis. PSYCH: Calm. Normal affect.  Assessment & Plan: Acute hypoxic respiratory failure due to ARDS from Covid pneumonia status post trach Right pneumothorax/empyema/necrotic RLL/bronchopleural fistula with large air leak Pseudomonas and Acinetobacter in BAL: completed 10 days course of meropenem. -COVID-19 positive on 07/13/2019. S/p remdesivir, Decadron, Actemra and convalescent plasma therapy -ARDS resolved.  COVID-19 negative x2 on 08/24/19 and 08/25/19. -Chest tube removed by CTS on 11/16.  Follow-up serial CXRs without new pneumonia -Decannulated by [facility] 11/30.  Stable since then. -Wean oxygen  Fever: spiked fever to 101 on 11/29.  No further fever.  Per ID, could be due to post Covid inflammatory process but need to exclude other possibilities, and observe off antibiotics. CT chest on 11/30 without acute/new finding to explain fever.  Blood cultures on 11/30 negative so far. -Trend leukocytosis and fever curve. -Follow ID recs.  Cough: concern about silent aspiration. CT chest as above.  Completed 7 days of doxycycline.  Improved. -As needed breathing treatments and antitussives  CKD-3 progressed to ESRD: now on HD TTS Bone mineral disorder -Nephrology managing -HD 11/21>> cannot do permanent access or OP HD due to  debility/deconditioning  Acute on chronic anemia: Multifactorial-blood loss from gastric ulcer, renal disease and acute illness.  Had EGD with clipping of culprit vessel.  No reported melena or hematochezia. -Hgb 13.2 (admit)>>>> multiple intermittent transfusions>> 7.3>1u> 8.4> 7.3 -Hemoccult stat.  CBC at noon.  Hold aspirin until Hemoccult and repeat CBC. -Aranesp and iron supplementation per nephrology -Monitor H&H  H. pylori infection Per previous attending, GI, Dr. Cristina Gong to follow up in 2-3 months from hospitalization to arrange 2-week treatment and possible f/u EGD.  CVA with left hemiparesis and dysphagia: small bilateral cerebral and right cerebellar infarct on 11/3.  -No anticoagulation given GI bleed.   -EC aspirin 81 mg daily after discussion with neurology, Dr. Erlinda Hong on 11/28.  Hold aspirin today -Will discuss with GI about Plavix after final decision about permanent access for HD -Continue statin.  Dysphagia: cortrak clogged and removed 11/30.  Reports good p.o. intake yesterday. -SLP upgraded to regular diet 12/2.  -RD following about p.o. intake.  Acute metabolic encephalopathy/anoxic brain injury: concern on MRI brain on 11/3.  Encephalopathy resolved. -Continue monitoring and delirium precautions  Paroxysmal A. Fib: Now in sinus rhythm without rate or rhythm control medication. -Not on anticoagulation due to GI bleed  MSSA bacteremia: TTE negative.  Did not get TEE.  Completed 6 weeks of antibiotic course with Ancef on 11/23.  Elevated D-dimer: Likely due to acute illness as above, particularly Covid.  Dopplers negative for DVT on 10/8.   Physical deconditioning/severe debility -Ongoing PT/OT/SLP-therapy recommending CIR  Insomnia: -Ramelteon nightly.  If no response, as needed trazodone  Pressure Injury 08/24/19 Sacrum Right;Left;Mid Stage II -  Partial thickness loss of dermis presenting as a shallow open ulcer with a red, pink wound bed without slough.   (Active)  08/24/19 0800  Location: Sacrum  Location Orientation: Right;Left;Mid  Staging: Stage II -  Partial thickness loss of dermis presenting as a shallow open ulcer with a red, pink wound bed without slough.  Wound Description (Comments): -- (stage 11 mid sacrum)  Present on Admission: No     Nutrition Problem: Increased nutrient needs Etiology: chronic illness  Signs/Symptoms: estimated needs  Interventions: Magic cup, MVI, Tube feeding, Calorie Count, Refer to RD note for recommendations   DVT prophylaxis: SCD Code Status: DNR/DNI Family Communication: Patient and/or RN. Available if any question. Disposition Plan: Remains inpatient.  Therapy recommending CIR for final disposition Consultants: Neurology (signed off), GI (signed off), PCCM, nephrology  Procedures:  10/02 ETT>10/30 10/03 Admit to [place] from [facility] ER, start decadron, remdesivir,  tociluzimab and convalescent plasma 10/10 MSSA bacteremia, CVL d/ced; ID consulted; increase OG tube outpt 10/11 vent weaning trial started 10/13 fever, pneumothorax, pig tail chest tube placed 10/14 renal failure 10/15 CRRT 10/17 EGD- gastric ulcer, nonbleeding visible vessel clipped 10/30 tracheostomy  11/1 D/C CRRT 11/2 Transfer to [facility] 11/3 Started on IHD 11/4 weaning on ATC 11/10 tunneled HD cath 11/16 Chest tube removed  11/30 decannulated.  Microbiology summarized: 10/20-COVID-19 positive. 10/2-blood cultures negative 10/3, 10/10-MRSA PCR negative 10/9-blood culture MSSA 10/9-urine culture insignificant growth. 10/9-respiratory culture with moderate staph aureus, rare Pseudomonas 10/13-blood cultures negative x5-day 10/21-BAL with Pseudomonas and Acinetobacter 10/21 and 10/24 BAL fungal cultures negative 10/28-sputum culture with Pseudomonas 10/28-blood cultures negative 11/1 and 11/2-COVID-19 negative 11/30-blood cultures negative so far   Sch  Meds:  Scheduled Meds: . atorvastatin  40 mg Oral q1800  .  budesonide (PULMICORT) nebulizer solution  0.5 mg Nebulization BID  . chlorhexidine  15 mL Mouth/Throat BID  . Chlorhexidine Gluconate Cloth  6 each Topical Q0600  . darbepoetin (ARANESP) injection - DIALYSIS  100 mcg Intravenous Q Sat-HD  . feeding supplement (ENSURE ENLIVE)  237 mL Oral TID BM  . heparin      . heparin injection (subcutaneous)  5,000 Units Subcutaneous Q8H  . mouth rinse  15 mL Mouth Rinse 10 times per day  . multivitamin  1 tablet Oral QHS  . pantoprazole sodium  40 mg Per Tube Daily  . ramelteon  8 mg Oral QHS  . sucralfate  1 g Oral Q6H   Continuous Infusions: . sodium chloride Stopped (09/21/19 1659)  . ferric gluconate (FERRLECIT/NULECIT) IV Stopped (09/25/19 1055)   PRN Meds:.sodium chloride, acetaminophen (TYLENOL) oral liquid 160 mg/5 mL, guaiFENesin-dextromethorphan, ipratropium-albuterol, ondansetron (ZOFRAN) IV, pneumococcal 23 valent vaccine, polyethylene glycol, polyvinyl alcohol, Resource ThickenUp Clear, Resource ThickenUp Clear, traZODone, white petrolatum  Antimicrobials: Anti-infectives (From admission, onward)   Start     Dose/Rate Route Frequency Ordered Stop   09/16/19 1700  doxycycline (VIBRAMYCIN) 100 mg in sodium chloride 0.9 % 250 mL IVPB  Status:  Discontinued     100 mg 125 mL/hr over 120 Minutes Intravenous Every 12 hours 09/16/19 1540 09/22/19 1840   08/26/19 2200  ceFAZolin (ANCEF) IVPB 1 g/50 mL premix     1 g 100 mL/hr over 30 Minutes Intravenous Every 24 hours 08/24/19 1021 09/15/19 2202   08/24/19 1100  meropenem (MERREM) 1 g in sodium chloride 0.9 % 100 mL IVPB     1 g 200 mL/hr over 30 Minutes Intravenous Every 24 hours 08/24/19 1021 08/25/19 1156   08/23/19 1100  ceFAZolin (ANCEF) IVPB 2g/100 mL premix  Status:  Discontinued     2 g 200 mL/hr over 30 Minutes Intravenous Every 12 hours 08/23/19 1048 08/24/19 1021   08/21/19 1700  vancomycin (VANCOCIN) IVPB 750 mg/150 ml premix  Status:  Discontinued     750 mg 150 mL/hr over  60 Minutes Intravenous Every 24 hours 08/21/19 0843 08/23/19 1048   08/21/19 1400  vancomycin (VANCOCIN) IVPB 750 mg/150 ml premix  Status:  Discontinued     750 mg 150 mL/hr over 60 Minutes Intravenous Every 24 hours 08/20/19 1146 08/20/19 1218   08/21/19 1000  meropenem (MERREM) 1 g in sodium chloride 0.9 % 100 mL IVPB  Status:  Discontinued     1 g 200 mL/hr over 30 Minutes Intravenous Every 12 hours 08/20/19 1535 08/23/19 1048   08/21/19 0413  meropenem (MERREM) 500 mg in sodium chloride 0.9 % 100 mL IVPB  Status:  Discontinued     500 mg 200 mL/hr over 30 Minutes Intravenous Every 24 hours 08/20/19 1245 08/20/19 1535   08/20/19 1330  vancomycin (VANCOCIN) 1,750 mg in sodium chloride 0.9 % 500 mL IVPB     1,750 mg 250 mL/hr over 120 Minutes Intravenous  Once 08/20/19 1222 08/20/19 1619   08/20/19 1300  vancomycin (VANCOCIN) 1,250 mg in sodium chloride 0.9 % 250 mL IVPB  Status:  Discontinued     1,250 mg 166.7 mL/hr over 90 Minutes Intravenous  Once 08/20/19 1146 08/20/19 1218   08/20/19 1224  vancomycin variable dose per unstable renal function (pharmacist dosing)  Status:  Discontinued      Does not apply See admin instructions 08/20/19 1225 08/21/19 1152  08/15/19 1600  meropenem (MERREM) 1 g in sodium chloride 0.9 % 100 mL IVPB  Status:  Discontinued     1 g 200 mL/hr over 30 Minutes Intravenous Every 12 hours 08/15/19 1543 08/20/19 1245   08/15/19 1000  voriconazole (VFEND) tablet 200 mg  Status:  Discontinued     200 mg Per Tube Every 12 hours 08/14/19 1148 08/15/19 1134   08/14/19 1600  anidulafungin (ERAXIS) 100 mg in sodium chloride 0.9 % 100 mL IVPB  Status:  Discontinued     100 mg 78 mL/hr over 100 Minutes Intravenous Every 24 hours 08/13/19 1645 08/14/19 1148   08/14/19 1300  voriconazole (VFEND) tablet 400 mg     400 mg Per Tube Every 12 hours 08/14/19 1148 08/14/19 2217   08/13/19 1700  anidulafungin (ERAXIS) 200 mg in sodium chloride 0.9 % 200 mL IVPB     200 mg 78  mL/hr over 200 Minutes Intravenous  Once 08/13/19 1645 08/13/19 2030   08/13/19 0200  ceFAZolin (ANCEF) IVPB 2g/100 mL premix  Status:  Discontinued     2 g 200 mL/hr over 30 Minutes Intravenous Every 12 hours 08/11/19 1512 08/15/19 1543   08/12/19 0200  ceFEPIme (MAXIPIME) 2 g in sodium chloride 0.9 % 100 mL IVPB     2 g 200 mL/hr over 30 Minutes Intravenous Every 12 hours 08/11/19 1509 08/12/19 1341   08/07/19 2200  ceFEPIme (MAXIPIME) 2 g in sodium chloride 0.9 % 100 mL IVPB  Status:  Discontinued     2 g 200 mL/hr over 30 Minutes Intravenous Every 12 hours 08/07/19 1540 08/11/19 1509   08/07/19 1000  anidulafungin (ERAXIS) 100 mg in sodium chloride 0.9 % 100 mL IVPB  Status:  Discontinued     100 mg 78 mL/hr over 100 Minutes Intravenous Every 24 hours 08/06/19 0934 08/08/19 1203   08/07/19 0800  ceFEPIme (MAXIPIME) 2 g in sodium chloride 0.9 % 100 mL IVPB  Status:  Discontinued     2 g 200 mL/hr over 30 Minutes Intravenous Every 24 hours 08/06/19 1036 08/07/19 1540   08/06/19 1000  anidulafungin (ERAXIS) 200 mg in sodium chloride 0.9 % 200 mL IVPB    Note to Pharmacy: please   200 mg 78 mL/hr over 200 Minutes Intravenous Every 24 hours 08/06/19 0925 08/06/19 1600   08/06/19 0800  ceFEPIme (MAXIPIME) 2 g in sodium chloride 0.9 % 100 mL IVPB  Status:  Discontinued     2 g 200 mL/hr over 30 Minutes Intravenous Every 12 hours 08/06/19 0733 08/06/19 1036   08/02/19 2200  ceFAZolin (ANCEF) IVPB 2g/100 mL premix  Status:  Discontinued     2 g 200 mL/hr over 30 Minutes Intravenous Every 8 hours 08/02/19 1338 08/06/19 0733   08/02/19 1400  vancomycin (VANCOCIN) 1,250 mg in sodium chloride 0.9 % 250 mL IVPB  Status:  Discontinued     1,250 mg 166.7 mL/hr over 90 Minutes Intravenous Every 24 hours 08/01/19 1320 08/02/19 1332   08/01/19 1330  vancomycin (VANCOCIN) 2,000 mg in sodium chloride 0.9 % 500 mL IVPB     2,000 mg 250 mL/hr over 120 Minutes Intravenous  Once 08/01/19 1158 08/01/19 1636    08/01/19 1300  ceFEPIme (MAXIPIME) 2 g in sodium chloride 0.9 % 100 mL IVPB  Status:  Discontinued     2 g 200 mL/hr over 30 Minutes Intravenous Every 12 hours 08/01/19 1158 08/02/19 1332   07/26/19 1600  remdesivir 100 mg in sodium chloride  0.9 % 250 mL IVPB     100 mg 500 mL/hr over 30 Minutes Intravenous Every 24 hours 07/26/19 0132 07/29/19 1823   07/26/19 0215  remdesivir 200 mg in sodium chloride 0.9 % 250 mL IVPB     200 mg 500 mL/hr over 30 Minutes Intravenous Once 07/26/19 0132 07/26/19 0520       I have personally reviewed the following labs and images: CBC: Recent Labs  Lab 09/19/19 1548  09/22/19 0401 09/23/19 0714 09/24/19 0735 09/25/19 0143 09/25/19 1115  WBC 11.5*   < > 13.1* 16.8* 14.9* 13.3* 14.7*  NEUTROABS 7.8*  --   --   --   --   --   --   HGB 8.1*   < > 7.3* 8.4* 8.6* 7.3* 8.2*  HCT 25.9*   < > 22.8* 26.0* 27.6* 22.9* 25.9*  MCV 93.2   < > 89.8 88.1 90.8 89.5 90.6  PLT 359   < > 352 367 350 349 366   < > = values in this interval not displayed.   BMP &GFR Recent Labs  Lab 09/21/19 0551 09/22/19 0401 09/23/19 0714 09/24/19 0503 09/25/19 0719  NA 133* 130* 129* 132* 131*  K 4.0 4.2 4.5 4.0 4.0  CL 97* 94* 95* 94* 93*  CO2 24 26 20* 25 26  GLUCOSE 93 87 89 79 95  BUN 25* 30* 37* 27* 34*  CREATININE 3.79* 5.04* 6.15* 4.88* 5.90*  CALCIUM 8.7* 8.8* 8.8* 8.6* 8.9  MG  --  1.6* 1.9  --   --   PHOS 3.0 4.1 4.8* 4.3 4.1   Estimated Creatinine Clearance: 13.9 mL/min (A) (by C-G formula based on SCr of 5.9 mg/dL (H)). Liver & Pancreas: Recent Labs  Lab 09/19/19 1548 09/21/19 0551 09/22/19 0401 09/24/19 0503 09/25/19 0719  ALBUMIN 1.9* 1.7* 1.7* 1.7* 1.7*   No results for input(s): LIPASE, AMYLASE in the last 168 hours. No results for input(s): AMMONIA in the last 168 hours. Diabetic: No results for input(s): HGBA1C in the last 72 hours. Recent Labs  Lab 09/24/19 1131 09/24/19 1556 09/24/19 2023 09/25/19 0008 09/25/19 0408  GLUCAP  99 108* 96 135* 84   Cardiac Enzymes: No results for input(s): CKTOTAL, CKMB, CKMBINDEX, TROPONINI in the last 168 hours. No results for input(s): PROBNP in the last 8760 hours. Coagulation Profile: No results for input(s): INR, PROTIME in the last 168 hours. Thyroid Function Tests: No results for input(s): TSH, T4TOTAL, FREET4, T3FREE, THYROIDAB in the last 72 hours. Lipid Profile: No results for input(s): CHOL, HDL, LDLCALC, TRIG, CHOLHDL, LDLDIRECT in the last 72 hours. Anemia Panel: No results for input(s): VITAMINB12, FOLATE, FERRITIN, TIBC, IRON, RETICCTPCT in the last 72 hours. Urine analysis:    Component Value Date/Time   COLORURINE YELLOW 08/01/2019 1745   APPEARANCEUR HAZY (A) 08/01/2019 1745   LABSPEC 1.023 08/01/2019 1745   PHURINE 5.0 08/01/2019 1745   GLUCOSEU NEGATIVE 08/01/2019 1745   HGBUR NEGATIVE 08/01/2019 1745   BILIRUBINUR NEGATIVE 08/01/2019 1745   KETONESUR NEGATIVE 08/01/2019 1745   PROTEINUR NEGATIVE 08/01/2019 1745   NITRITE NEGATIVE 08/01/2019 1745   LEUKOCYTESUR NEGATIVE 08/01/2019 1745   Sepsis Labs: Invalid input(s): PROCALCITONIN, [place]  Microbiology: Recent Results (from the past 240 hour(s))  Culture, blood (routine x 2)     Status: None (Preliminary result)   Collection Time: 09/22/19  9:10 AM   Specimen: BLOOD RIGHT HAND  Result Value Ref Range Status   Specimen Description BLOOD RIGHT HAND  Final  Special Requests AEROBIC BOTTLE ONLY Blood Culture adequate volume  Final   Culture   Final    NO GROWTH 3 DAYS Performed at [facility], [place] [address], [place], [place] [address]    Report Status PENDING  Incomplete  Culture, blood (routine x 2)     Status: None (Preliminary result)   Collection Time: 09/22/19  9:17 AM   Specimen: BLOOD LEFT HAND  Result Value Ref Range Status   Specimen Description BLOOD LEFT HAND  Final   Special Requests AEROBIC BOTTLE ONLY Blood Culture adequate volume  Final   Culture   Final    NO  GROWTH 3 DAYS Performed at [facility], [place] [address], [place], [place] [address]    Report Status PENDING  Incomplete    Radiology Studies: No results found.    T. [place]  If 7PM-7AM, please contact night-coverage [email or web address] Password [email or web address] 09/25/2019, 11:41 AM

## 2019-09-25 NOTE — Progress Notes (Signed)
[name]   Reason for visit: Follow up on intermittent fever  Interval History: The patient has remained afebrile for the past 4 days (3 days off ABX now). His trach was decanulated earlier this week. He denies cough and has had improvement in his appetite. +BM as pt has currently soiled himself in bed. Worked with PT, attempting ambulation earlier in the day. Fever curve, WBC & Cr trends, imaging, cx results, and ABX usage all independently reviewed    Current Facility-Administered Medications:    0.9 %  sodium chloride infusion, , Intravenous, PRN, Desiree Hane, MD, Stopped at 09/21/19 1659   acetaminophen (TYLENOL) solution 650 mg, 650 mg, Per Tube, Q4H PRN, Oretha Milch D, MD, 650 mg at 09/22/19 0506   aspirin EC tablet 81 mg, 81 mg, Oral, Daily, Gonfa, Taye T, MD   atorvastatin (LIPITOR) tablet 40 mg, 40 mg, Oral, q1800, Oretha Milch D, MD, 40 mg at 09/24/19 1655   budesonide (PULMICORT) nebulizer solution 0.5 mg, 0.5 mg, Nebulization, BID, Vasireddy, Grier Mitts, MD, 0.5 mg at 09/24/19 2036   chlorhexidine (PERIDEX) 0.12 % solution 15 mL, 15 mL, Mouth/Throat, BID, Oretha Milch D, MD, 15 mL at 09/25/19 1155   Chlorhexidine Gluconate Cloth 2 % PADS 6 each, 6 each, Topical, Q0600, Loren Racer, PA-C, 6 each at 09/25/19 0430   Darbepoetin Alfa (ARANESP) injection 100 mcg, 100 mcg, Intravenous, Q Sat-HD, Monica Becton, MD, 100 mcg at 09/20/19 1111   feeding supplement (ENSURE ENLIVE) (ENSURE ENLIVE) liquid 237 mL, 237 mL, Oral, TID BM, Gonfa, Taye T, MD, 237 mL at 09/25/19 1409   [COMPLETED] ferric gluconate (NULECIT) 125 mg in sodium chloride 0.9 % 100 mL IVPB, 125 mg, Intravenous, Once, Last Rate: 110 mL/hr at 09/17/19 1636, 125 mg at 09/17/19 1636 **FOLLOWED BY** ferric gluconate (NULECIT) 125 mg in sodium chloride 0.9 % 100 mL IVPB, 125 mg, Intravenous, Q T,Th,Sa-HD, Donnamae Jude, RPH, Stopped at 09/25/19 1055    guaiFENesin-dextromethorphan (ROBITUSSIN DM) 100-10 MG/5ML syrup 5 mL, 5 mL, Per Tube, Q4H PRN, Kirby-Graham, Karsten Fells, NP, 5 mL at 09/25/19 1416   heparin injection 5,000 Units, 5,000 Units, Subcutaneous, Q8H, Oretha Milch D, MD, 5,000 Units at 09/25/19 1409   ipratropium-albuterol (DUONEB) 0.5-2.5 (3) MG/3ML nebulizer solution 3 mL, 3 mL, Nebulization, Q4H PRN, Cyndia Skeeters, Taye T, MD   multivitamin (RENA-VIT) tablet 1 tablet, 1 tablet, Oral, QHS, Hongalgi, Anand D, MD, 1 tablet at 09/24/19 2131   ondansetron (ZOFRAN) injection 4 mg, 4 mg, Intravenous, Q6H PRN, Lennox Grumbles, Belinda K, NP   pantoprazole sodium (PROTONIX) 40 mg/20 mL oral suspension 40 mg, 40 mg, Per Tube, Daily, Nettey, Myrlene Broker D, MD, 40 mg at 09/25/19 1155   pneumococcal 23 valent vaccine (PNU-IMMUNE) injection 0.5 mL, 0.5 mL, Intramuscular, Prior to discharge, Little Ishikawa, MD   polyethylene glycol (MIRALAX / GLYCOLAX) packet 17 g, 17 g, Per Tube, Daily PRN, Oretha Milch D, MD   polyvinyl alcohol (LIQUIFILM TEARS) 1.4 % ophthalmic solution 1 drop, 1 drop, Both Eyes, PRN, Oretha Milch D, MD, 1 drop at 09/14/19 2007   ramelteon (ROZEREM) tablet 8 mg, 8 mg, Oral, QHS, Cyndia Skeeters, Taye T, MD, 8 mg at 09/24/19 2131   Resource ThickenUp Clear, , Oral, PRN, Hongalgi, Lenis Dickinson, MD   Resource ThickenUp Clear, , Oral, PRN, Vernell Leep D, MD   sucralfate (CARAFATE) 1 GM/10ML suspension 1 g, 1 g, Oral, Q6H, Oretha Milch D, MD, 1 g at 09/25/19 1155  traZODone (DESYREL) tablet 25 mg, 25 mg, Oral, BID BM & HS PRN, Gonfa, Taye T, MD   white petrolatum (VASELINE) gel 1 application, 1 application, Topical, PRN, Modena Jansky, MD   Physical Exam:   Vitals:   09/25/19 1100 09/25/19 1104  BP: (!) 148/66 124/68  Pulse: 97 (!) 104  Resp:  16  Temp:  98.5 F (36.9 C)  SpO2:  98%   Physical Exam Lines: LT [place] permacath, PIV, condom catheter Gen: pleasant, chronically ill, mild distress secondary to soiled perineum, A&Ox  3 Head: NCAT, no temporal wasting evident EENT: PERRL, EOMI, MMM, adequate dentition Neck: supple, +bandage overlying former trach site, no JVD CV: NRRR, no murmurs evident Pulm: CTA bilaterally, mild wheeze, no retractions Abd: soft, NTND, +BS Extrems: 2+ pitting LE edema, 1+ pulses Skin: no rashes, adequate skin turgor Neuro: CN II-XII grossly intact, speech/mentation somewhat slowed, no focal neurologic deficits appreciated, gait was not assessed, A&Ox 3   Review of Systems:  Review of Systems  Constitutional: Positive for malaise/fatigue. Negative for chills, fever and weight loss.  HENT: Negative for congestion, hearing loss, sinus pain and sore throat.   Eyes: Negative for blurred vision, photophobia and discharge.  Respiratory: Negative for cough, hemoptysis and shortness of breath.   Cardiovascular: Negative for chest pain, palpitations, orthopnea and leg swelling.  Gastrointestinal: Negative for abdominal pain, constipation, diarrhea, heartburn, nausea and vomiting.  Genitourinary: Negative for dysuria, flank pain, frequency and urgency.  Musculoskeletal: Positive for myalgias. Negative for back pain and joint pain.  Skin: Negative for itching and rash.  Neurological: Positive for weakness. Negative for tremors, seizures and headaches.  Endo/Heme/Allergies: Negative for polydipsia. Does not bruise/bleed easily.  Psychiatric/Behavioral: Positive for depression. Negative for substance abuse. The patient is not nervous/anxious and does not have insomnia.      Lab Results  Component Value Date   WBC 14.7 (H) 09/25/2019   HGB 8.2 (L) 09/25/2019   HCT 25.9 (L) 09/25/2019   MCV 90.6 09/25/2019   PLT 366 09/25/2019    Lab Results  Component Value Date   CREATININE 5.90 (H) 09/25/2019   BUN 34 (H) 09/25/2019   NA 131 (L) 09/25/2019   K 4.0 09/25/2019   CL 93 (L) 09/25/2019   CO2 26 09/25/2019    Lab Results  Component Value Date   ALT 6 09/10/2019   AST 35 09/10/2019    ALKPHOS 73 09/10/2019     Microbiology: Recent Results (from the past 240 hour(s))  Culture, blood (routine x 2)     Status: None (Preliminary result)   Collection Time: 09/22/19  9:10 AM   Specimen: BLOOD RIGHT HAND  Result Value Ref Range Status   Specimen Description BLOOD RIGHT HAND  Final   Special Requests AEROBIC BOTTLE ONLY Blood Culture adequate volume  Final   Culture   Final    NO GROWTH 3 DAYS Performed at [facility], [address], [place], [place] [address]    Report Status PENDING  Incomplete  Culture, blood (routine x 2)     Status: None (Preliminary result)   Collection Time: 09/22/19  9:17 AM   Specimen: BLOOD LEFT HAND  Result Value Ref Range Status   Specimen Description BLOOD LEFT HAND  Final   Special Requests AEROBIC BOTTLE ONLY Blood Culture adequate volume  Final   Culture   Final    NO GROWTH 3 DAYS Performed at [facility], [address], [place], [place] [address]  Report Status PENDING  Incomplete    Impression/Plan: Patient is a 68 year old African-American male with recent COVID-19 infection resulting in diffuse multifocal pneumonia/ARDS requiring tracheostomy and prolonged mechanical ventilation complicated by acute renal failure and pneumothorax and severe sepsis and now chronic debility with leukocytosis and intermittent fever.  1. Fever -patient last had an intermittent fever on November 29th to 101.  He has received a prolonged course of various antibiotics numbering 52 days in total.  For the past 3 days he is remained afebrile off antibiotics.  From a respiratory standpoint, the patient has tolerated decannulation and has had his enteral diet advanced.  Per the patient, he denies any choking with efforts to eat.  If new infiltrates appear, would consider a repeat speech therapy assessment and swallow evaluation to rule out aspiration.  Blood cultures from September 22, 2019 remain unrevealing.  His most recent Covid test from  November 2 was also negative.  Would consider repeating the patient's blood culture x2 for any fever greater than 100.5, but would recognize with clinical stability the patient most likely has a noninfectious fever.  It would also not be unreasonable to consider checking Doppler ultrasounds of his lower extremities to rule out DVTs given his chronic debility and prolonged immobility.  Will sign off at this time.  2.  Leukocytosis -for much of the past 1 month, the patient has maintained mild leukocytosis despite prolonged antibiotic therapy.  Given his ESRD and advanced anasarca, this may simply be reactive from the patient's protein calorie malnutrition and anasarca/third spacing, particularly in the setting of his acute on chronic renal failure.  Per report, the patient's Covid course was complicated with a pneumothorax and subsequent large bronchopulmonary fistula requiring a chest tube and possible early empyema.  3.  Acute renal failure -patient developed an MSSA bacteremia with hypotensive shock and resultant acute renal failure with metabolic acidosis requiring CV RT while he was a patient at [facility].  He also had an acute blood loss with melena due to a gastric ulcer treated with an clipping previously.  He had a dialysis catheter placed on November 10 and has been receiving either receive ERT and now transitioning to intermittent hemodialysis since that time with delayed renal recovery and poor urine output still.  Will defer to nephrology regarding the patient's dry weight and third spacing/anasarca following his Covid 1919 diagnosis and subsequent treatment.

## 2019-09-25 NOTE — Progress Notes (Signed)
Inpatient Rehab Admissions Coordinator:   Met with pt in dialysis to discuss CIR.  He is very hopeful to be able to attend program pending insurance authorization and CLIP process.  Will continue to follow for possible admission to CIR.   Shann Medal, PT, DPT Admissions Coordinator [phone] 09/25/19  10:20 AM

## 2019-09-25 NOTE — Progress Notes (Signed)
Physical Therapy Treatment Patient Details Name: [name] MRN: [id number] DOB: 12-Jul-1951 Today's Date: 09/25/2019    History of Present Illness 68 y/o male diagnosed with COVID on 9/29 presented to the [facility] ED on 10/2 with dyspnea, hypoxemia requiring intubation.  Admitted to [facility], developed AKI requiring CRRT, pneumothorax requiring chest tube and had a large bronchopleural fistula treated for 10 days with a bronchial blocker.  Tracheostomy placed on 10/30. Tested COVID neg x2 on 11/2. MRI on 11/4 showed There is cortical restricted diffusion posteriorly in the right frontal lobe consistent with acute infarct. Additional smaller regions of diffusion abnormality are present in the posterior left frontal lobe, anterior left frontal lobe, and right greater than left medial parietal lobes as well as right cerebellum.     PT Comments    Pt progressing towards physical therapy goals. Was able to perform transfers to/from EOB with +2 max to total assist. Pt fatigued quickly with EOB activity, but with good rehab effort for trunk stabilization exercise. Pt able to tolerate leaning onto RUE and pushing back up to midline, as well as reaching for a target with RUE and completing ADL activity with gross mod assist for sitting balance throughout. Will continue to follow and progress as able per POC.     Follow Up Recommendations  CIR     Equipment Recommendations  Wheelchair (measurements PT);Wheelchair cushion (measurements PT);Hospital bed;Other (comment)    Recommendations for Other Services Rehab consult     Precautions / Restrictions Precautions Precautions: Fall Precaution Comments: skin breakdown on heels and joints Required Braces or Orthoses: Other Brace Other Brace: bil prevalon boots Restrictions Weight Bearing Restrictions: No Other Position/Activity Restrictions: elevate LUE when not exercising    Mobility  Bed Mobility Overal bed mobility: Needs Assistance Bed Mobility:  Rolling Rolling: Max assist;+2 for physical assistance Sidelying to sit: +2 for physical assistance;+2 for safety/equipment;Max assist Supine to sit: Max assist;+2 for physical assistance;+2 for safety/equipment Sit to supine: Total assist;+2 for physical assistance;+2 for safety/equipment Sit to sidelying: Max assist;+2 for physical assistance;+2 for safety/equipment General bed mobility comments: +2 assist for all aspects of bed mobility.   Transfers Overall transfer level: Needs assistance               General transfer comment: Unable to progress to OOB transfers this date  Ambulation/Gait                 Stairs             Wheelchair Mobility    Modified Rankin (Stroke Patients Only) Modified Rankin (Stroke Patients Only) Pre-Morbid Rankin Score: No symptoms Modified Rankin: Severe disability     Balance Overall balance assessment: Needs assistance Sitting-balance support: Feet supported;Single extremity supported Sitting balance-Leahy Scale: Poor Sitting balance - Comments: Pt initially needing mod A progressing to min guard Postural control: Left lateral lean Standing balance support: Single extremity supported Standing balance-Leahy Scale: Zero Standing balance comment: unable to attempt standing this session                            Cognition Arousal/Alertness: Awake/alert Behavior During Therapy: Flat affect Overall Cognitive Status: Impaired/Different from baseline Area of Impairment: Memory;Safety/judgement                   Current Attention Level: Sustained Memory: Decreased short-term memory Following Commands: Follows one step commands consistently Safety/Judgement: Decreased awareness of deficits;Decreased awareness of safety Awareness: Intellectual Problem Solving:  Slow processing;Requires verbal cues General Comments: pt is struggling to remember all the staff who are helping and talking to him       Exercises General Exercises - Upper Extremity Shoulder Flexion: Left;10 reps Shoulder ABduction: Left;10 reps Shoulder Horizontal ADduction: (IR and ER L elbow) Elbow Flexion: AAROM;Left;10 reps Elbow Extension: AAROM;Left;10 reps Wrist Flexion: AAROM;Left;10 reps Wrist Extension: AAROM;Left;10 reps Digit Composite Flexion: AAROM;Left;5 reps Composite Extension: AAROM;Left;10 reps General Exercises - Lower Extremity Ankle Circles/Pumps: AAROM;5 reps Long Arc Quad: Left;10 reps;AAROM Heel Slides: AAROM;10 reps Hip ABduction/ADduction: AAROM;10 reps Straight Leg Raises: AAROM;10 reps Hip Flexion/Marching: AAROM;10 reps Other Exercises Other Exercises: PROM of L UE supine from hand to shoulder; pt able to initate hand squeeze and shoulder IR with increased time Other Exercises: Lower trunk rotation bil x10 reps Other Exercises: cervical spine PROM lateral flexion to the left and left rotation x 5 mins    General Comments        Pertinent Vitals/Pain Pain Assessment: Faces Faces Pain Scale: Hurts little more Pain Location: back/buttock from mattress with it deflated while sitting EOB Pain Descriptors / Indicators: Grimacing Pain Intervention(s): Limited activity within patient's tolerance;Monitored during session;Repositioned    Home Living                      Prior Function            PT Goals (current goals can now be found in the care plan section) Acute Rehab PT Goals Patient Stated Goal: Brush teeth PT Goal Formulation: With patient Time For Goal Achievement: 10/09/19 Potential to Achieve Goals: Fair Progress towards PT goals: Progressing toward goals    Frequency    Min 3X/week      PT Plan Current plan remains appropriate    Co-evaluation PT/OT/SLP Co-Evaluation/Treatment: Yes Reason for Co-Treatment: For patient/therapist safety;To address functional/ADL transfers;Complexity of the patient's impairments (multi-system involvement) PT goals  addressed during session: Mobility/safety with mobility;Balance;Strengthening/ROM OT goals addressed during session: ADL's and self-care      AM-PAC PT "6 Clicks" Mobility   Outcome Measure  Help needed turning from your back to your side while in a flat bed without using bedrails?: A Lot Help needed moving from lying on your back to sitting on the side of a flat bed without using bedrails?: Total Help needed moving to and from a bed to a chair (including a wheelchair)?: Total Help needed standing up from a chair using your arms (e.g., wheelchair or bedside chair)?: Total Help needed to walk in hospital room?: Total Help needed climbing 3-5 steps with a railing? : Total 6 Click Score: 7    End of Session Equipment Utilized During Treatment: Oxygen Activity Tolerance: Patient limited by fatigue Patient left: in bed;with call bell/phone within reach;with nursing/sitter in room Nurse Communication: Mobility status PT Visit Diagnosis: Unsteadiness on feet (R26.81);Other abnormalities of gait and mobility (R26.89);Muscle weakness (generalized) (M62.81);Difficulty in walking, not elsewhere classified (R26.2)     Time: 1345-1410 PT Time Calculation (min) (ACUTE ONLY): 25 min  Charges:  $Therapeutic Activity: 8-22 mins                     [name], PT, DPT Acute Rehabilitation Services Pager: [phone] Office: [phone]    [name] 09/25/2019, 3:27 PM

## 2019-09-25 NOTE — Plan of Care (Signed)
  Problem: Education: Goal: Knowledge of risk factors and measures for prevention of condition will improve Outcome: Progressing   Problem: Respiratory: Goal: Will maintain a patent airway Outcome: Progressing   Problem: Clinical Measurements: Goal: Diagnostic test results will improve Outcome: Progressing

## 2019-09-25 NOTE — Procedures (Signed)
I was present at this dialysis session. I have reviewed the session itself and made appropriate changes.   Sig inc in SCr and minimal UOP.  No GFR recovery yet.  3.5L UF goal.  [facility]  Filed Weights   09/20/19 1952 09/23/19 1700 09/25/19 0650  Weight: 86.5 kg 89.4 kg 90.3 kg    Recent Labs  Lab 09/25/19 0719  NA 131*  K 4.0  CL 93*  CO2 26  GLUCOSE 95  BUN 34*  CREATININE 5.90*  CALCIUM 8.9  PHOS 4.1    Recent Labs  Lab 09/19/19 1548  09/23/19 0714 09/24/19 0735 09/25/19 0143  WBC 11.5*   < > 16.8* 14.9* 13.3*  NEUTROABS 7.8*  --   --   --   --   HGB 8.1*   < > 8.4* 8.6* 7.3*  HCT 25.9*   < > 26.0* 27.6* 22.9*  MCV 93.2   < > 88.1 90.8 89.5  PLT 359   < > 367 350 349   < > = values in this interval not displayed.    Scheduled Meds: . aspirin EC  81 mg Oral Daily  . atorvastatin  40 mg Oral q1800  . budesonide (PULMICORT) nebulizer solution  0.5 mg Nebulization BID  . chlorhexidine  15 mL Mouth/Throat BID  . Chlorhexidine Gluconate Cloth  6 each Topical Q0600  . darbepoetin (ARANESP) injection - DIALYSIS  100 mcg Intravenous Q Sat-HD  . feeding supplement (ENSURE ENLIVE)  237 mL Oral TID BM  . heparin injection (subcutaneous)  5,000 Units Subcutaneous Q8H  . mouth rinse  15 mL Mouth Rinse 10 times per day  . multivitamin  1 tablet Oral QHS  . pantoprazole sodium  40 mg Per Tube Daily  . ramelteon  8 mg Oral QHS  . sucralfate  1 g Oral Q6H   Continuous Infusions: . sodium chloride Stopped (09/21/19 1659)  . ferric gluconate (FERRLECIT/NULECIT) IV 125 mg (09/20/19 1220)   PRN Meds:.sodium chloride, acetaminophen (TYLENOL) oral liquid 160 mg/5 mL, guaiFENesin-dextromethorphan, ipratropium-albuterol, ondansetron (ZOFRAN) IV, pneumococcal 23 valent vaccine, polyethylene glycol, polyvinyl alcohol, Resource ThickenUp Clear, Resource ThickenUp Clear, traZODone, white petrolatum   Pearson Grippe  MD 09/25/2019, 9:42 AM

## 2019-09-25 NOTE — Progress Notes (Signed)
Occupational Therapy Treatment Patient Details Name: [name] MRN: [id number] DOB: 14-May-1951 Today's Date: 09/25/2019    History of present illness 68 y/o male diagnosed with COVID on 9/29 presented to the [facility] ED on 10/2 with dyspnea, hypoxemia requiring intubation.  Admitted to [facility], developed AKI requiring CRRT, pneumothorax requiring chest tube and had a large bronchopleural fistula treated for 10 days with a bronchial blocker.  Tracheostomy placed on 10/30. Tested COVID neg x2 on 11/2. MRI on 11/4 showed There is cortical restricted diffusion posteriorly in the right frontal lobe consistent with acute infarct. Additional smaller regions of diffusion abnormality are present in the posterior left frontal lobe, anterior left frontal lobe, and right greater than left medial parietal lobes as well as right cerebellum.    OT comments  Patient semi-supine in bed, agreeable to therapy. Patient reports feeling "okay" after dialysis. Patient on 2.5L O2 in bed with saturation mid 90's. Doffed for bed mobility with O2 saturating between 86-89% but unable to reach above 90 therefore donned while seated edge of bed with saturations returning to low to mid 90's. Patient require max A x2 for bed mobility with mod verbal, tactile cues for body mechanics and sequencing. Seated edge of bed patient participate in neuro-reeducation activities; weight shifting and weight bearing through R elbow. Patient initially requiring mod A to maintain balance however able to achieve static balance with min guard with positioning. Patient able to wash his face with mod A for sitting balance and starts to fatigue requiring increased assist and cues to maintain static sitting therefore transferred back to bed to perform oral care requiring set up assistance.    Follow Up Recommendations  Supervision - Intermittent;SNF    Equipment Recommendations  Other (comment)(defer to next venue)       Precautions / Restrictions  Precautions Precautions: Fall Precaution Comments: skin breakdown on heels and joints Required Braces or Orthoses: Other Brace Other Brace: bil prevalon boots Restrictions Weight Bearing Restrictions: No Other Position/Activity Restrictions: elevate LUE when not exercising       Mobility Bed Mobility Overal bed mobility: Needs Assistance Bed Mobility: Rolling Rolling: Max assist;+2 for physical assistance Sidelying to sit: +2 for physical assistance;+2 for safety/equipment;Max assist Supine to sit: Max assist;+2 for physical assistance;+2 for safety/equipment Sit to supine: Max assist;+2 for physical assistance;+2 for safety/equipment Sit to sidelying: Max assist;+2 for physical assistance;+2 for safety/equipment General bed mobility comments: 2 max to scoot up bed  Transfers Overall transfer level: Needs assistance               General transfer comment: not able to try    Balance Overall balance assessment: Needs assistance Sitting-balance support: Feet supported;Single extremity supported Sitting balance-Leahy Scale: Poor Sitting balance - Comments: Pt initially needing mod A progressing to min guard Postural control: Left lateral lean   Standing balance comment: unable to attempt standing this session                           ADL either performed or assessed with clinical judgement   ADL Overall ADL's : Needs assistance/impaired Eating/Feeding: Set up Eating/Feeding Details (indicate cue type and reason): patient able to hold onto water bottle and take a drink Grooming: Wash/dry face;Oral care Grooming Details (indicate cue type and reason): mod to max A with sitting balance to wash face, fatigued and perform oral care semi-supine in bed with set up assistance  Vision Baseline Vision/History: Wears glasses Wears Glasses: At all times            Cognition Arousal/Alertness:  Awake/alert Behavior During Therapy: Flat affect Overall Cognitive Status: Impaired/Different from baseline Area of Impairment: Memory;Safety/judgement                    Memory: Decreased short-term memory Safety/Judgement: Decreased awareness of deficits;Decreased awareness of safety        Exercises General Exercises - Upper Extremity Shoulder Flexion: PROM Left; 5 reps Shoulder ABduction: PROM Left; 5 reps Elbow Flexion: PROM;Left;5 reps Elbow Extension: PROM;Left;5 reps Wrist Flexion: PROM;Left;5 reps Wrist Extension: PROM ;Left; 5reps            Pertinent Vitals/ Pain       Pain Assessment: Faces Faces Pain Scale: Hurts little more Pain Location: back/buttock from matress with it deflated Pain Descriptors / Indicators: Grimacing Pain Intervention(s): Limited activity within patient's tolerance;Monitored during session;Repositioned         Frequency  Min 2X/week        Progress Toward Goals  OT Goals(current goals can now be found in the care plan section)   Patient progressing towards goals  Acute Rehab OT Goals Patient Stated Goal: Brush teeth OT Goal Formulation: With patient Time For Goal Achievement: 10/09/19 Potential to Achieve Goals: Good  Plan      Co-evaluation    PT/OT/SLP Co-Evaluation/Treatment: Yes Reason for Co-Treatment: Complexity of the patient's impairments (multi-system involvement);For patient/therapist safety;To address functional/ADL transfers PT goals addressed during session: Mobility/safety with mobility;Balance;Strengthening/ROM OT goals addressed during session: ADL's and self-care      AM-PAC OT "6 Clicks" Daily Activity     Outcome Measure   Help from another person eating meals?: A Little Help from another person taking care of personal grooming?: A Lot Help from another person toileting, which includes using toliet, bedpan, or urinal?: Total Help from another person bathing (including washing, rinsing,  drying)?: Total Help from another person to put on and taking off regular upper body clothing?: Total Help from another person to put on and taking off regular lower body clothing?: Total 6 Click Score: 9    End of Session Equipment Utilized During Treatment: Oxygen  OT Visit Diagnosis: Unsteadiness on feet (R26.81);Other abnormalities of gait and mobility (R26.89);Muscle weakness (generalized) (M62.81);Hemiplegia and hemiparesis Hemiplegia - Right/Left: Left Hemiplegia - caused by: Cerebral infarction   Activity Tolerance Patient tolerated treatment well   Patient Left in bed;with call bell/phone within reach;with bed alarm set   Nurse Communication Mobility status        Time: 1336-1410 OT Time Calculation (min): 34 min  Charges: OT General Charges $OT Visit: 1 Visit OT Treatments $Self Care/Home Management : 8-22 mins  Shon Millet OT OT office: [place] 09/25/2019, 2:37 PM

## 2019-09-26 LAB — GLUCOSE, CAPILLARY
Glucose-Capillary: 100 mg/dL — ABNORMAL HIGH (ref 70–99)
Glucose-Capillary: 116 mg/dL — ABNORMAL HIGH (ref 70–99)
Glucose-Capillary: 96 mg/dL (ref 70–99)
Glucose-Capillary: 98 mg/dL (ref 70–99)

## 2019-09-26 MED ORDER — PHENOL 1.4 % MT LIQD: 1.0000 | OROMUCOSAL | Status: DC | PRN

## 2019-09-26 NOTE — Progress Notes (Signed)
PROGRESS NOTE  [name] ATF:[id number] DOB: [birth year]   PCP: Kathyrn Drown, MD  Patient is from: Home  DOA: 07/25/2019 LOS: 36  Brief Narrative / Interim history: 68 year old male with PMH of GERD and BPH, initially presented on 07/25/2019 with worsening dyspnea after recently testing positive for COVID-19 on 07/22/2019.  He required emergent intubation at [facility] ED.  He was admitted to the [facility] hospital for acute hypoxic respiratory failure secondary to severe ARDS from Covid pneumonia and completed Decadron, Remdesivir and Tocilizumab.  Hospital course complicated by acute kidney injury requiring CRRT, encephalopathy, pneumothorax complicated by persistent air leak and bronchopleural fistula requiring endobronchial blocker and pleur-evac, prolonged mechanical ventilation s/p tracheostomy on 10/30, MSSA bacteremia with negative echocardiogram intermittent pressor support with dialysis, melena with acute blood loss anemia requiring transfusion, IV PPI, empiric octreotide, EGD showed gastric ulcer and nonbleeding visible vessel clipped, transferred to [facility] on 08/26/2023 intermittent HD, subacute right cerebral/cerebellar infarcts on MRI.  PCCM following for tracheostomy management, weekly.  Nephrology on board for HD needs.  Permanent access pending improvement in his debility.  Subjective: No major events overnight of this morning.  Reports difficulty sleeping.  No other complaints.  Denies pain, dyspnea, nausea, vomiting or abdominal pain.  Cough improving.  Objective: Vitals:   09/26/19 0848 09/26/19 0900 09/26/19 0927 09/26/19 1300  BP:   135/78   Pulse:   96   Resp:   20   Temp:   98 F (36.7 C)   TempSrc:   Oral   SpO2: 99% 99% 98% 97%  Weight:      Height:        Intake/Output Summary (Last 24 hours) at 09/26/2019 1452 Last data filed at 09/26/2019 1447 Gross per 24 hour  Intake 883 ml  Output 500 ml  Net 383 ml   Filed Weights   09/23/19 1700  09/25/19 0650 09/25/19 2032  Weight: 89.4 kg 90.3 kg 90.3 kg    Examination: GENERAL: No acute distress.  Appears well.  HEENT: MMM.  Vision and hearing grossly intact.  NECK: Supple.  No apparent JVD.  RESP:  No IWOB.  Rhonchi bilaterally. CVS:  RRR. Heart sounds normal.  ABD/GI/GU: Bowel sounds present. Soft. Non tender.  MSK/EXT: No apparent deformity or edema.  Left hemiparesis.  Muscle mass wasting. SKIN: no apparent skin lesion or wound NEURO: Awake, alert and oriented appropriately.  Left hemiparesis. PSYCH: Calm. Normal affect.  Assessment & Plan: Acute hypoxic respiratory failure due to ARDS from Covid pneumonia status post trach Right pneumothorax/empyema/necrotic RLL/bronchopleural fistula with large air leak Pseudomonas and Acinetobacter in BAL: completed 10 days course of meropenem. -COVID-19 positive on 07/13/2019. S/p remdesivir, Decadron, Actemra and convalescent plasma therapy -ARDS resolved.  COVID-19 negative x2 on 08/24/19 and 08/25/19. -Chest tube removed by CTS on 11/16.  Follow-up serial CXRs without new pneumonia -Decannulated by [facility] 11/30.  Stable since then. -Incentive spirometry. -Wean to room air.  Saturating at 100% on 2 L.  Fever: spiked fever to 101 on 11/29.  No further fever.  Per ID, could be due to post Covid inflammatory process but need to exclude other possibilities, and observe off antibiotics. CT chest on 11/30 without acute/new finding to explain fever.  Blood cultures on 11/30 negative so far. -Trend leukocytosis and fever curve. -ID signed off.  Cough: concern about silent aspiration. CT chest as above.  Completed 7 days of doxycycline.  Improved. -As needed breathing treatments and antitussives  CKD-3 progressed to  ESRD: now on HD TTS Bone mineral disorder -Nephrology managing -HD 11/21>> cannot do permanent access or OP HD due to debility/deconditioning  Acute on chronic anemia: Multifactorial-blood loss from gastric ulcer, renal  disease and acute illness.  Had EGD with clipping of culprit vessel.  No reported melena or hematochezia.  Repeat FOBT negative. -Hgb 13.2 (admit)>>>> multiple intermittent transfusions>> 7.3>1u> 8.4> 7.3> 8.2 -Aranesp and iron supplementation per nephrology -Monitor H&H  H. pylori infection Per previous attending, GI, Dr. Cristina Gong to follow up in 2-3 months from hospitalization to arrange 2-week treatment and possible f/u EGD.  CVA with left hemiparesis and dysphagia: small bilateral cerebral and right cerebellar infarct on 11/3.  -No anticoagulation given GI bleed.   -EC aspirin 81 mg daily after discussion with neurology, Dr. Erlinda Hong on 11/28.  -Will discuss with GI about Plavix after final decision about permanent access for HD -Continue statin.  Dysphagia: cortrak clogged and removed 11/30.  Reports good p.o. intake yesterday. -SLP upgraded to regular diet 12/2.  -RD following about p.o. intake-continue supplements  Acute metabolic encephalopathy/anoxic brain injury: concern on MRI brain on 11/3.  Resolved. -Continue monitoring and delirium precautions  Paroxysmal A. Fib: Now in sinus rhythm without rate or rhythm control medication. -Only on low-dose aspirin due to GI bleed.  MSSA bacteremia: TTE negative.  Did not get TEE.  Completed 6 weeks of antibiotic course with Ancef on 11/23.  Elevated D-dimer: Likely due to acute illness as above, particularly Covid.  Dopplers negative for DVT on 10/8.   Physical deconditioning/severe debility/left hemiparesis -Ongoing PT/OT/SLP-therapy recommending CIR  Insomnia: -Ramelteon nightly.  If no response, as needed trazodone  Pressure Injury 08/24/19 Sacrum Right;Left;Mid Stage II -  Partial thickness loss of dermis presenting as a shallow open ulcer with a red, pink wound bed without slough.  (Active)  08/24/19 0800  Location: Sacrum  Location Orientation: Right;Left;Mid  Staging: Stage II -  Partial thickness loss of dermis presenting as a  shallow open ulcer with a red, pink wound bed without slough.  Wound Description (Comments): -- (stage 11 mid sacrum)  Present on Admission: No     Nutrition Problem: Increased nutrient needs Etiology: chronic illness  Signs/Symptoms: estimated needs  Interventions: Magic cup, MVI, Tube feeding, Calorie Count, Refer to RD note for recommendations   DVT prophylaxis: SCD Code Status: DNR/DNI Family Communication: Patient and/or RN. Available if any question. Disposition Plan: Remains inpatient.  Therapy recommending CIR for final disposition Consultants: Neurology (signed off), GI (signed off), PCCM, nephrology  Procedures:  10/02 ETT>10/30 10/03 Admit to [place] from [facility] ER, start decadron, remdesivir,  tociluzimab and convalescent plasma 10/10 MSSA bacteremia, CVL d/ced; ID consulted; increase OG tube outpt 10/11 vent weaning trial started 10/13 fever, pneumothorax, pig tail chest tube placed 10/14 renal failure 10/15 CRRT 10/17 EGD- gastric ulcer, nonbleeding visible vessel clipped 10/30 tracheostomy  11/1 D/C CRRT 11/2 Transfer to [facility] 11/3 Started on IHD 11/4 weaning on ATC 11/10 tunneled HD cath 11/16 Chest tube removed  11/30 decannulated.  Microbiology summarized: 10/20-COVID-19 positive. 10/2-blood cultures negative 10/3, 10/10-MRSA PCR negative 10/9-blood culture MSSA 10/9-urine culture insignificant growth. 10/9-respiratory culture with moderate staph aureus, rare Pseudomonas 10/13-blood cultures negative x5-day 10/21-BAL with Pseudomonas and Acinetobacter 10/21 and 10/24 BAL fungal cultures negative 10/28-sputum culture with Pseudomonas 10/28-blood cultures negative 11/1 and 11/2-COVID-19 negative 11/30-blood cultures negative so far   Sch Meds:  Scheduled Meds: . aspirin EC  81 mg Oral Daily  . atorvastatin  40 mg Oral q1800  .  budesonide (PULMICORT) nebulizer solution  0.5 mg Nebulization BID  . chlorhexidine  15 mL Mouth/Throat BID  . Chlorhexidine  Gluconate Cloth  6 each Topical Q0600  . darbepoetin (ARANESP) injection - DIALYSIS  100 mcg Intravenous Q Sat-HD  . feeding supplement  1 Container Oral TID BM  . heparin injection (subcutaneous)  5,000 Units Subcutaneous Q8H  . multivitamin  1 tablet Oral QHS  . pantoprazole sodium  40 mg Per Tube Daily  . ramelteon  8 mg Oral QHS  . sucralfate  1 g Oral Q6H   Continuous Infusions: . sodium chloride Stopped (09/21/19 1659)  . ferric gluconate (FERRLECIT/NULECIT) IV Stopped (09/25/19 1055)   PRN Meds:.sodium chloride, acetaminophen (TYLENOL) oral liquid 160 mg/5 mL, guaiFENesin-dextromethorphan, ipratropium-albuterol, ondansetron (ZOFRAN) IV, pneumococcal 23 valent vaccine, polyethylene glycol, polyvinyl alcohol, Resource ThickenUp Clear, Resource ThickenUp Clear, traZODone, white petrolatum  Antimicrobials: Anti-infectives (From admission, onward)   Start     Dose/Rate Route Frequency Ordered Stop   09/16/19 1700  doxycycline (VIBRAMYCIN) 100 mg in sodium chloride 0.9 % 250 mL IVPB  Status:  Discontinued     100 mg 125 mL/hr over 120 Minutes Intravenous Every 12 hours 09/16/19 1540 09/22/19 1840   08/26/19 2200  ceFAZolin (ANCEF) IVPB 1 g/50 mL premix     1 g 100 mL/hr over 30 Minutes Intravenous Every 24 hours 08/24/19 1021 09/15/19 2202   08/24/19 1100  meropenem (MERREM) 1 g in sodium chloride 0.9 % 100 mL IVPB     1 g 200 mL/hr over 30 Minutes Intravenous Every 24 hours 08/24/19 1021 08/25/19 1156   08/23/19 1100  ceFAZolin (ANCEF) IVPB 2g/100 mL premix  Status:  Discontinued     2 g 200 mL/hr over 30 Minutes Intravenous Every 12 hours 08/23/19 1048 08/24/19 1021   08/21/19 1700  vancomycin (VANCOCIN) IVPB 750 mg/150 ml premix  Status:  Discontinued     750 mg 150 mL/hr over 60 Minutes Intravenous Every 24 hours 08/21/19 0843 08/23/19 1048   08/21/19 1400  vancomycin (VANCOCIN) IVPB 750 mg/150 ml premix  Status:  Discontinued     750 mg 150 mL/hr over 60 Minutes Intravenous  Every 24 hours 08/20/19 1146 08/20/19 1218   08/21/19 1000  meropenem (MERREM) 1 g in sodium chloride 0.9 % 100 mL IVPB  Status:  Discontinued     1 g 200 mL/hr over 30 Minutes Intravenous Every 12 hours 08/20/19 1535 08/23/19 1048   08/21/19 0413  meropenem (MERREM) 500 mg in sodium chloride 0.9 % 100 mL IVPB  Status:  Discontinued     500 mg 200 mL/hr over 30 Minutes Intravenous Every 24 hours 08/20/19 1245 08/20/19 1535   08/20/19 1330  vancomycin (VANCOCIN) 1,750 mg in sodium chloride 0.9 % 500 mL IVPB     1,750 mg 250 mL/hr over 120 Minutes Intravenous  Once 08/20/19 1222 08/20/19 1619   08/20/19 1300  vancomycin (VANCOCIN) 1,250 mg in sodium chloride 0.9 % 250 mL IVPB  Status:  Discontinued     1,250 mg 166.7 mL/hr over 90 Minutes Intravenous  Once 08/20/19 1146 08/20/19 1218   08/20/19 1224  vancomycin variable dose per unstable renal function (pharmacist dosing)  Status:  Discontinued      Does not apply See admin instructions 08/20/19 1225 08/21/19 1152   08/15/19 1600  meropenem (MERREM) 1 g in sodium chloride 0.9 % 100 mL IVPB  Status:  Discontinued     1 g 200 mL/hr over 30 Minutes Intravenous Every 12  hours 08/15/19 1543 08/20/19 1245   08/15/19 1000  voriconazole (VFEND) tablet 200 mg  Status:  Discontinued     200 mg Per Tube Every 12 hours 08/14/19 1148 08/15/19 1134   08/14/19 1600  anidulafungin (ERAXIS) 100 mg in sodium chloride 0.9 % 100 mL IVPB  Status:  Discontinued     100 mg 78 mL/hr over 100 Minutes Intravenous Every 24 hours 08/13/19 1645 08/14/19 1148   08/14/19 1300  voriconazole (VFEND) tablet 400 mg     400 mg Per Tube Every 12 hours 08/14/19 1148 08/14/19 2217   08/13/19 1700  anidulafungin (ERAXIS) 200 mg in sodium chloride 0.9 % 200 mL IVPB     200 mg 78 mL/hr over 200 Minutes Intravenous  Once 08/13/19 1645 08/13/19 2030   08/13/19 0200  ceFAZolin (ANCEF) IVPB 2g/100 mL premix  Status:  Discontinued     2 g 200 mL/hr over 30 Minutes Intravenous Every 12  hours 08/11/19 1512 08/15/19 1543   08/12/19 0200  ceFEPIme (MAXIPIME) 2 g in sodium chloride 0.9 % 100 mL IVPB     2 g 200 mL/hr over 30 Minutes Intravenous Every 12 hours 08/11/19 1509 08/12/19 1341   08/07/19 2200  ceFEPIme (MAXIPIME) 2 g in sodium chloride 0.9 % 100 mL IVPB  Status:  Discontinued     2 g 200 mL/hr over 30 Minutes Intravenous Every 12 hours 08/07/19 1540 08/11/19 1509   08/07/19 1000  anidulafungin (ERAXIS) 100 mg in sodium chloride 0.9 % 100 mL IVPB  Status:  Discontinued     100 mg 78 mL/hr over 100 Minutes Intravenous Every 24 hours 08/06/19 0934 08/08/19 1203   08/07/19 0800  ceFEPIme (MAXIPIME) 2 g in sodium chloride 0.9 % 100 mL IVPB  Status:  Discontinued     2 g 200 mL/hr over 30 Minutes Intravenous Every 24 hours 08/06/19 1036 08/07/19 1540   08/06/19 1000  anidulafungin (ERAXIS) 200 mg in sodium chloride 0.9 % 200 mL IVPB    Note to Pharmacy: please   200 mg 78 mL/hr over 200 Minutes Intravenous Every 24 hours 08/06/19 0925 08/06/19 1600   08/06/19 0800  ceFEPIme (MAXIPIME) 2 g in sodium chloride 0.9 % 100 mL IVPB  Status:  Discontinued     2 g 200 mL/hr over 30 Minutes Intravenous Every 12 hours 08/06/19 0733 08/06/19 1036   08/02/19 2200  ceFAZolin (ANCEF) IVPB 2g/100 mL premix  Status:  Discontinued     2 g 200 mL/hr over 30 Minutes Intravenous Every 8 hours 08/02/19 1338 08/06/19 0733   08/02/19 1400  vancomycin (VANCOCIN) 1,250 mg in sodium chloride 0.9 % 250 mL IVPB  Status:  Discontinued     1,250 mg 166.7 mL/hr over 90 Minutes Intravenous Every 24 hours 08/01/19 1320 08/02/19 1332   08/01/19 1330  vancomycin (VANCOCIN) 2,000 mg in sodium chloride 0.9 % 500 mL IVPB     2,000 mg 250 mL/hr over 120 Minutes Intravenous  Once 08/01/19 1158 08/01/19 1636   08/01/19 1300  ceFEPIme (MAXIPIME) 2 g in sodium chloride 0.9 % 100 mL IVPB  Status:  Discontinued     2 g 200 mL/hr over 30 Minutes Intravenous Every 12 hours 08/01/19 1158 08/02/19 1332   07/26/19  1600  remdesivir 100 mg in sodium chloride 0.9 % 250 mL IVPB     100 mg 500 mL/hr over 30 Minutes Intravenous Every 24 hours 07/26/19 0132 07/29/19 1823   07/26/19 0215  remdesivir 200 mg in  sodium chloride 0.9 % 250 mL IVPB     200 mg 500 mL/hr over 30 Minutes Intravenous Once 07/26/19 0132 07/26/19 0520       I have personally reviewed the following labs and images: CBC: Recent Labs  Lab 09/19/19 1548  09/22/19 0401 09/23/19 0714 09/24/19 0735 09/25/19 0143 09/25/19 1115  WBC 11.5*   < > 13.1* 16.8* 14.9* 13.3* 14.7*  NEUTROABS 7.8*  --   --   --   --   --   --   HGB 8.1*   < > 7.3* 8.4* 8.6* 7.3* 8.2*  HCT 25.9*   < > 22.8* 26.0* 27.6* 22.9* 25.9*  MCV 93.2   < > 89.8 88.1 90.8 89.5 90.6  PLT 359   < > 352 367 350 349 366   < > = values in this interval not displayed.   BMP &GFR Recent Labs  Lab 09/21/19 0551 09/22/19 0401 09/23/19 0714 09/24/19 0503 09/25/19 0719  NA 133* 130* 129* 132* 131*  K 4.0 4.2 4.5 4.0 4.0  CL 97* 94* 95* 94* 93*  CO2 24 26 20* 25 26  GLUCOSE 93 87 89 79 95  BUN 25* 30* 37* 27* 34*  CREATININE 3.79* 5.04* 6.15* 4.88* 5.90*  CALCIUM 8.7* 8.8* 8.8* 8.6* 8.9  MG  --  1.6* 1.9  --   --   PHOS 3.0 4.1 4.8* 4.3 4.1   Estimated Creatinine Clearance: 13.9 mL/min (A) (by C-G formula based on SCr of 5.9 mg/dL (H)). Liver & Pancreas: Recent Labs  Lab 09/19/19 1548 09/21/19 0551 09/22/19 0401 09/24/19 0503 09/25/19 0719  ALBUMIN 1.9* 1.7* 1.7* 1.7* 1.7*   No results for input(s): LIPASE, AMYLASE in the last 168 hours. No results for input(s): AMMONIA in the last 168 hours. Diabetic: No results for input(s): HGBA1C in the last 72 hours. Recent Labs  Lab 09/25/19 2033 09/26/19 0007 09/26/19 0434 09/26/19 0727 09/26/19 1107  GLUCAP 108* 98 100* 96 116*   Cardiac Enzymes: No results for input(s): CKTOTAL, CKMB, CKMBINDEX, TROPONINI in the last 168 hours. No results for input(s): PROBNP in the last 8760 hours. Coagulation Profile:  No results for input(s): INR, PROTIME in the last 168 hours. Thyroid Function Tests: No results for input(s): TSH, T4TOTAL, FREET4, T3FREE, THYROIDAB in the last 72 hours. Lipid Profile: No results for input(s): CHOL, HDL, LDLCALC, TRIG, CHOLHDL, LDLDIRECT in the last 72 hours. Anemia Panel: No results for input(s): VITAMINB12, FOLATE, FERRITIN, TIBC, IRON, RETICCTPCT in the last 72 hours. Urine analysis:    Component Value Date/Time   COLORURINE YELLOW 08/01/2019 1745   APPEARANCEUR HAZY (A) 08/01/2019 1745   LABSPEC 1.023 08/01/2019 1745   PHURINE 5.0 08/01/2019 1745   GLUCOSEU NEGATIVE 08/01/2019 1745   HGBUR NEGATIVE 08/01/2019 1745   BILIRUBINUR NEGATIVE 08/01/2019 1745   KETONESUR NEGATIVE 08/01/2019 1745   PROTEINUR NEGATIVE 08/01/2019 1745   NITRITE NEGATIVE 08/01/2019 1745   LEUKOCYTESUR NEGATIVE 08/01/2019 1745   Sepsis Labs: Invalid input(s): PROCALCITONIN, [place]  Microbiology: Recent Results (from the past 240 hour(s))  Culture, blood (routine x 2)     Status: None (Preliminary result)   Collection Time: 09/22/19  9:10 AM   Specimen: BLOOD RIGHT HAND  Result Value Ref Range Status   Specimen Description BLOOD RIGHT HAND  Final   Special Requests AEROBIC BOTTLE ONLY Blood Culture adequate volume  Final   Culture   Final    NO GROWTH 4 DAYS Performed at [facility], [address] [place], [place] [address]    Report Status PENDING  Incomplete  Culture, blood (routine x 2)     Status: None (Preliminary result)   Collection Time: 09/22/19  9:17 AM   Specimen: BLOOD LEFT HAND  Result Value Ref Range Status   Specimen Description BLOOD LEFT HAND  Final   Special Requests AEROBIC BOTTLE ONLY Blood Culture adequate volume  Final   Culture   Final    NO GROWTH 4 DAYS Performed at [facility], [place] [address], [place], [place] [address]    Report Status PENDING  Incomplete    Radiology Studies: No results found.   Nyashia Raney T. [place]  If 7PM-7AM, please contact night-coverage [email or web address] Password [email or web address] 09/26/2019, 2:52 PM

## 2019-09-26 NOTE — Plan of Care (Signed)
  Problem: Education: Goal: Knowledge of risk factors and measures for prevention of condition will improve Outcome: Progressing   

## 2019-09-26 NOTE — Progress Notes (Signed)
 [facility] Progress Note   Subjective: Seen with wife at bedside. No C/Os.   Objective Vitals:   09/26/19 0434 09/26/19 0848 09/26/19 0900 09/26/19 0927  BP: 135/81   135/78  Pulse: 94   96  Resp: 19   20  Temp: 97.9 F (36.6 C)   98 F (36.7 C)  TempSrc:    Oral  SpO2: 100% 99% 99% 98%  Weight:      Height:       Physical Exam General: Pleasant chronically ill appearing male in NAD Heart: S1,S2 RRR Lungs: CTAB, no WOB Abdomen: S, NT Extremities: No LE edema. Heel protectors in place. Still trace LUE edema-appears to be resolving.  Dialysis Access: [facility]    Additional Objective Labs: Basic Metabolic Panel: Recent Labs  Lab 09/23/19 0714 09/24/19 0503 09/25/19 0719  NA 129* 132* 131*  K 4.5 4.0 4.0  CL 95* 94* 93*  CO2 20* 25 26  GLUCOSE 89 79 95  BUN 37* 27* 34*  CREATININE 6.15* 4.88* 5.90*  CALCIUM 8.8* 8.6* 8.9  PHOS 4.8* 4.3 4.1   Liver Function Tests: Recent Labs  Lab 09/22/19 0401 09/24/19 0503 09/25/19 0719  ALBUMIN 1.7* 1.7* 1.7*   No results for input(s): LIPASE, AMYLASE in the last 168 hours. CBC: Recent Labs  Lab 09/19/19 1548  09/22/19 0401 09/23/19 0714 09/24/19 0735 09/25/19 0143 09/25/19 1115  WBC 11.5*   < > 13.1* 16.8* 14.9* 13.3* 14.7*  NEUTROABS 7.8*  --   --   --   --   --   --   HGB 8.1*   < > 7.3* 8.4* 8.6* 7.3* 8.2*  HCT 25.9*   < > 22.8* 26.0* 27.6* 22.9* 25.9*  MCV 93.2   < > 89.8 88.1 90.8 89.5 90.6  PLT 359   < > 352 367 350 349 366   < > = values in this interval not displayed.   Blood Culture    Component Value Date/Time   SDES BLOOD LEFT HAND 09/22/2019 0917   SPECREQUEST AEROBIC BOTTLE ONLY Blood Culture adequate volume 09/22/2019 0917   CULT  09/22/2019 0917    NO GROWTH 4 DAYS Performed at [facility], [place] [address], [place], [place] [address]    REPTSTATUS PENDING 09/22/2019 2637    Cardiac Enzymes: No results for input(s): CKTOTAL, CKMB, CKMBINDEX, TROPONINI in the last  168 hours. CBG: Recent Labs  Lab 09/25/19 1623 09/25/19 2033 09/26/19 0007 09/26/19 0434 09/26/19 0727  GLUCAP 128* 108* 98 100* 96   Iron Studies: No results for input(s): IRON, TIBC, TRANSFERRIN, FERRITIN in the last 72 hours. _0 @ Studies/Results: No results found. Medications: . sodium chloride Stopped (09/21/19 1659)  . ferric gluconate (FERRLECIT/NULECIT) IV Stopped (09/25/19 1055)   . aspirin EC  81 mg Oral Daily  . atorvastatin  40 mg Oral q1800  . budesonide (PULMICORT) nebulizer solution  0.5 mg Nebulization BID  . chlorhexidine  15 mL Mouth/Throat BID  . Chlorhexidine Gluconate Cloth  6 each Topical Q0600  . darbepoetin (ARANESP) injection - DIALYSIS  100 mcg Intravenous Q Sat-HD  . feeding supplement  1 Container Oral TID BM  . heparin injection (subcutaneous)  5,000 Units Subcutaneous Q8H  . multivitamin  1 tablet Oral QHS  . pantoprazole sodium  40 mg Per Tube Daily  . ramelteon  8 mg Oral QHS  . sucralfate  1 g Oral Q6H     Dialysis Orders: New start ESRD-> 4hr, 3K, TDC,  400/800, EDW pending.  Assessment/Plan: 1.S/p acute hypoxemia and respiratory failure due to COVID 19: Chest tube out,trach removed 11/30. Steady improvement. 2. ESRD:New start this admission, on TTS-  next tomorrow. Making some urine -still dialysis dependentat this point. SCr 4.8-5.9 EGFR 13-11. Will plan for permanent access once patient is more stable. 3.HTN/volume:BP controlled today.HD 12/3 Net UF 3.5 liters. Establishing EDW.  ContinueUF as tolerated - goal2-2.5 kg tomorrow. 4. Anemia:Hgb8.2. HxGI bleed this admission. No heparin with HD.Continue Aranesp and IV iron. 5. Secondary hyperparathyroidism:CorrCa slightly high. Not on calcium or vit D, use low Ca bath. Phos now within goal.Check iPTH with next HD. 6.CVA:Bilatcerebral cortex and right cerebellum small acute to subacuteinfarctswithneuromuscular weakness/encephalopathy. 6. Dispo: Lurline Idol out  which is great - this will dramatically help ability to arrange outpatient HD. Still very debilitated, will need ongoing PT/OT.   H.  NP-C 09/26/2019, 10:23 AM  [employer] [phone]

## 2019-09-26 NOTE — Plan of Care (Signed)
  Problem: Respiratory: Goal: Will maintain a patent airway Outcome: Progressing   Problem: Skin Integrity: Goal: Risk for impaired skin integrity will decrease Outcome: Progressing

## 2019-09-27 LAB — CBC
HCT: 25.9 % — ABNORMAL LOW (ref 39.0–52.0)
Hemoglobin: 7.9 g/dL — ABNORMAL LOW (ref 13.0–17.0)
MCH: 27.7 pg (ref 26.0–34.0)
MCHC: 30.5 g/dL (ref 30.0–36.0)
MCV: 90.9 fL (ref 80.0–100.0)
Platelets: 391 10*3/uL (ref 150–400)
RBC: 2.85 MIL/uL — ABNORMAL LOW (ref 4.22–5.81)
RDW: 14.6 % (ref 11.5–15.5)
WBC: 13.5 10*3/uL — ABNORMAL HIGH (ref 4.0–10.5)
nRBC: 0 % (ref 0.0–0.2)

## 2019-09-27 LAB — RENAL FUNCTION PANEL
Albumin: 1.8 g/dL — ABNORMAL LOW (ref 3.5–5.0)
Anion gap: 11 (ref 5–15)
BUN: 25 mg/dL — ABNORMAL HIGH (ref 8–23)
CO2: 27 mmol/L (ref 22–32)
Calcium: 9 mg/dL (ref 8.9–10.3)
Chloride: 94 mmol/L — ABNORMAL LOW (ref 98–111)
Creatinine, Ser: 4.83 mg/dL — ABNORMAL HIGH (ref 0.61–1.24)
GFR calc Af Amer: 13 mL/min — ABNORMAL LOW (ref >60)
GFR calc non Af Amer: 12 mL/min — ABNORMAL LOW (ref >60)
Glucose, Bld: 84 mg/dL (ref 70–99)
Phosphorus: 3.8 mg/dL (ref 2.5–4.6)
Potassium: 3.9 mmol/L (ref 3.5–5.1)
Sodium: 132 mmol/L — ABNORMAL LOW (ref 135–145)

## 2019-09-27 LAB — CULTURE, BLOOD (ROUTINE X 2)
Culture: NO GROWTH
Culture: NO GROWTH
Special Requests: ADEQUATE
Special Requests: ADEQUATE

## 2019-09-27 LAB — GLUCOSE, CAPILLARY
Glucose-Capillary: 83 mg/dL (ref 70–99)
Glucose-Capillary: 92 mg/dL (ref 70–99)
Glucose-Capillary: 91 mg/dL (ref 70–99)

## 2019-09-27 LAB — MAGNESIUM: Magnesium: 1.9 mg/dL (ref 1.7–2.4)

## 2019-09-27 MED ORDER — SODIUM CHLORIDE 0.9 % IV SOLN: 100.0000 mL | INTRAVENOUS | Status: DC | PRN

## 2019-09-27 MED ORDER — LIDOCAINE-PRILOCAINE 2.5-2.5 % EX CREA: 1.0000 "application " | TOPICAL_CREAM | CUTANEOUS | Status: DC | PRN

## 2019-09-27 MED ORDER — PANTOPRAZOLE SODIUM 40 MG PO TBEC
40.0000 mg | DELAYED_RELEASE_TABLET | Freq: Every day | ORAL | Status: DC
  Administered 2019-09-28 – 2019-10-03 (×6): 40 mg via ORAL
  Filled 2019-09-27 (×6): qty 1

## 2019-09-27 MED ORDER — ALTEPLASE 2 MG IJ SOLR: 2.0000 mg | Freq: Once | INTRAMUSCULAR | Status: DC | PRN

## 2019-09-27 MED ORDER — HEPARIN SODIUM (PORCINE) 1000 UNIT/ML IJ SOLN
INTRAMUSCULAR | Status: AC
  Administered 2019-09-27: 3800 [IU] via INTRAVENOUS_CENTRAL
  Filled 2019-09-27: qty 4

## 2019-09-27 MED ORDER — LIDOCAINE HCL (PF) 1 % IJ SOLN: 5.0000 mL | INTRAMUSCULAR | Status: DC | PRN

## 2019-09-27 MED ORDER — PENTAFLUOROPROP-TETRAFLUOROETH EX AERO: 1.0000 "application " | INHALATION_SPRAY | CUTANEOUS | Status: DC | PRN

## 2019-09-27 MED ORDER — DARBEPOETIN ALFA 100 MCG/0.5ML IJ SOSY
PREFILLED_SYRINGE | INTRAMUSCULAR | Status: AC
  Administered 2019-09-27: 100 ug via INTRAVENOUS
  Filled 2019-09-27: qty 0.5

## 2019-09-27 MED ORDER — HEPARIN SODIUM (PORCINE) 1000 UNIT/ML DIALYSIS
1000.0000 [IU] | INTRAMUSCULAR | Status: DC | PRN
  Administered 2019-09-27: 12:00:00 3800 [IU] via INTRAVENOUS_CENTRAL

## 2019-09-27 NOTE — Plan of Care (Signed)
°  Problem: Coping: °Goal: Level of anxiety will decrease °Outcome: Progressing °  °

## 2019-09-27 NOTE — Plan of Care (Signed)
  Problem: Education: Goal: Knowledge of risk factors and measures for prevention of condition will improve Outcome: Progressing   Problem: Respiratory: Goal: Will maintain a patent airway Outcome: Progressing   

## 2019-09-27 NOTE — Progress Notes (Signed)
[facility] Progress Note   Subjective: Seen on HD. Tolerating well. No C/Os. Very calm and pleasant.     Objective Vitals:   09/27/19 0739 09/27/19 0740 09/27/19 0800 09/27/19 0830  BP: (!) 141/82 135/70 128/73 127/74  Pulse: 84 81 82 94  Resp:      Temp:      TempSrc:      SpO2:      Weight:      Height:       Physical Exam General: Pleasant chronically ill appearing male in NAD Heart: S1,S2 RRR Lungs: CTAB, no WOB Abdomen: S, NT. Foley patent with 200cc clear yellow urine.  Extremities: No LE edema. Heel protectors in place. Still trace LUE edema-appears to be resolving.  Dialysis Access: LIJ TDC blood lines connected, no issues.     Additional Objective Labs: Basic Metabolic Panel: Recent Labs  Lab 09/24/19 0503 09/25/19 0719 09/27/19 0524  NA 132* 131* 132*  K 4.0 4.0 3.9  CL 94* 93* 94*  CO2 '25 26 27  '$ GLUCOSE 79 95 84  BUN 27* 34* 25*  CREATININE 4.88* 5.90* 4.83*  CALCIUM 8.6* 8.9 9.0  PHOS 4.3 4.1 3.8   Liver Function Tests: Recent Labs  Lab 09/24/19 0503 09/25/19 0719 09/27/19 0524  ALBUMIN 1.7* 1.7* 1.8*   No results for input(s): LIPASE, AMYLASE in the last 168 hours. CBC: Recent Labs  Lab 09/23/19 0714 09/24/19 0735 09/25/19 0143 09/25/19 1115 09/27/19 0524  WBC 16.8* 14.9* 13.3* 14.7* 13.5*  HGB 8.4* 8.6* 7.3* 8.2* 7.9*  HCT 26.0* 27.6* 22.9* 25.9* 25.9*  MCV 88.1 90.8 89.5 90.6 90.9  PLT 367 350 349 366 391   Blood Culture    Component Value Date/Time   SDES BLOOD LEFT HAND 09/22/2019 0917   SPECREQUEST AEROBIC BOTTLE ONLY Blood Culture adequate volume 09/22/2019 0917   CULT  09/22/2019 0917    NO GROWTH 4 DAYS Performed at [facility], [place] [address], [place], [place] [address]    REPTSTATUS PENDING 09/22/2019 4332    Cardiac Enzymes: No results for input(s): CKTOTAL, CKMB, CKMBINDEX, TROPONINI in the last 168 hours. CBG: Recent Labs  Lab 09/26/19 0007 09/26/19 0434 09/26/19 0727  09/26/19 1107 09/27/19 0634  GLUCAP 98 100* 96 116* 83   Iron Studies: No results for input(s): IRON, TIBC, TRANSFERRIN, FERRITIN in the last 72 hours. '@lablastinr3'$ @ Studies/Results: No results found. Medications: . sodium chloride Stopped (09/21/19 1659)  . sodium chloride    . sodium chloride    . ferric gluconate (FERRLECIT/NULECIT) IV Stopped (09/25/19 1055)   . aspirin EC  81 mg Oral Daily  . atorvastatin  40 mg Oral q1800  . budesonide (PULMICORT) nebulizer solution  0.5 mg Nebulization BID  . chlorhexidine  15 mL Mouth/Throat BID  . Chlorhexidine Gluconate Cloth  6 each Topical Q0600  . darbepoetin (ARANESP) injection - DIALYSIS  100 mcg Intravenous Q Sat-HD  . feeding supplement  1 Container Oral TID BM  . heparin injection (subcutaneous)  5,000 Units Subcutaneous Q8H  . multivitamin  1 tablet Oral QHS  . pantoprazole sodium  40 mg Per Tube Daily  . ramelteon  8 mg Oral QHS  . sucralfate  1 g Oral Q6H     Dialysis Orders: New start ESRD-> 4hr, 3K, TDC, 400/800, EDW pending.  Assessment/Plan: 1.S/p acute hypoxemia and respiratory failure due to COVID 19: Chest tube out,trach removed 11/30. Steady improvement. 2. ESRD:New start this admission, on TTS-HD today. Making some urine -  still dialysis dependentat this point. SCr 4.8-5.9 EGFR 13-11. Will plan for permanent access once patient is more stable. 3.HTN/volume:BPcontrolled today. ContinueUF as tolerated - goal2-2.5 kg today. 4. Anemia:Hgb7.9. HxGI bleed this admission. No heparin with HD.Continue Aranesp and IV iron. Follow trend.  5. Secondary hyperparathyroidism:CorrCa slightly high. Not on calcium or vit D, use low Ca bath. Phos now within goal.Check iPTH with next HD. 6.CVA:Bilatcerebral cortex and right cerebellum small acute to subacuteinfarctswithneuromuscular weakness/encephalopathy. 6. Dispo: Lurline Idol out which is great - this will dramatically help ability to arrange outpatient HD.  Still very debilitated, will need ongoing PT/OT. Needs HD in chair. Will attempt next week.   Jessen Siegman H. Meaghann Choo NP-C 09/27/2019, 8:38 AM  [employer] [phone]

## 2019-09-27 NOTE — Progress Notes (Signed)
Pt made the comment to this RN that he was unable to taste anything since he "woke up". Pt states that this is the reason why he does not eat much since he can not taste the food. Notified MD Justin Mend via secure chat and attempted to page [phone].  Paulla Fore, RN, BSN

## 2019-09-27 NOTE — Plan of Care (Signed)
  Problem: Respiratory: Goal: Will maintain a patent airway Outcome: Progressing   

## 2019-09-28 LAB — GLUCOSE, CAPILLARY
Glucose-Capillary: 88 mg/dL (ref 70–99)
Glucose-Capillary: 93 mg/dL (ref 70–99)
Glucose-Capillary: 103 mg/dL — ABNORMAL HIGH (ref 70–99)

## 2019-09-28 NOTE — Progress Notes (Signed)
[facility] Progress Note   Subjective: Awake, alert, no new complaints. Very pleasant.     Objective Vitals:   09/27/19 2103 09/28/19 0449 09/28/19 0828 09/28/19 0911  BP:  138/85  133/85  Pulse:  89 90 (!) 101  Resp:  '18 18 18  '$ Temp:  98 F (36.7 C)  99.2 F (37.3 C)  TempSrc:  Oral  Oral  SpO2: 98% 98% 98% 96%  Weight:      Height:       Physical Exam General:Pleasant chronically ill appearing male in NAD Heart:S1,S2 RRR Lungs:CTAB, no WOB Abdomen:S, NT. Foley patent clear yellow urine.  Extremities:No LE edema. Heel protectors in place. Still trace LUE edema-appears to be resolving. Dialysis Access:LIJ TDC Drsg CDI    Additional Objective Labs: Basic Metabolic Panel: Recent Labs  Lab 09/24/19 0503 09/25/19 0719 09/27/19 0524  NA 132* 131* 132*  K 4.0 4.0 3.9  CL 94* 93* 94*  CO2 '25 26 27  '$ GLUCOSE 79 95 84  BUN 27* 34* 25*  CREATININE 4.88* 5.90* 4.83*  CALCIUM 8.6* 8.9 9.0  PHOS 4.3 4.1 3.8   Liver Function Tests: Recent Labs  Lab 09/24/19 0503 09/25/19 0719 09/27/19 0524  ALBUMIN 1.7* 1.7* 1.8*   No results for input(s): LIPASE, AMYLASE in the last 168 hours. CBC: Recent Labs  Lab 09/23/19 0714 09/24/19 0735 09/25/19 0143 09/25/19 1115 09/27/19 0524  WBC 16.8* 14.9* 13.3* 14.7* 13.5*  HGB 8.4* 8.6* 7.3* 8.2* 7.9*  HCT 26.0* 27.6* 22.9* 25.9* 25.9*  MCV 88.1 90.8 89.5 90.6 90.9  PLT 367 350 349 366 391   Blood Culture    Component Value Date/Time   SDES BLOOD LEFT HAND 09/22/2019 0917   SPECREQUEST AEROBIC BOTTLE ONLY Blood Culture adequate volume 09/22/2019 0917   CULT  09/22/2019 0917    NO GROWTH 5 DAYS Performed at [facility], [place] [address], [place], [place] [address]    REPTSTATUS 09/27/2019 FINAL 09/22/2019 4782    Cardiac Enzymes: No results for input(s): CKTOTAL, CKMB, CKMBINDEX, TROPONINI in the last 168 hours. CBG: Recent Labs  Lab 09/26/19 1107 09/27/19 0634 09/27/19 1216  09/27/19 2049 09/28/19 0721  GLUCAP 116* 83 92 91 88   Iron Studies: No results for input(s): IRON, TIBC, TRANSFERRIN, FERRITIN in the last 72 hours. '@lablastinr3'$ @ Studies/Results: No results found. Medications: . sodium chloride Stopped (09/21/19 1659)   . aspirin EC  81 mg Oral Daily  . atorvastatin  40 mg Oral q1800  . budesonide (PULMICORT) nebulizer solution  0.5 mg Nebulization BID  . chlorhexidine  15 mL Mouth/Throat BID  . Chlorhexidine Gluconate Cloth  6 each Topical Q0600  . darbepoetin (ARANESP) injection - DIALYSIS  100 mcg Intravenous Q Sat-HD  . feeding supplement  1 Container Oral TID BM  . heparin injection (subcutaneous)  5,000 Units Subcutaneous Q8H  . multivitamin  1 tablet Oral QHS  . pantoprazole  40 mg Oral Q1200  . ramelteon  8 mg Oral QHS  . sucralfate  1 g Oral Q6H     Dialysis Orders: New start ESRD-> 4hr, 3K, TDC, 400/800, EDW pending.  Assessment/Plan: 1.S/p acute hypoxemia and respiratory failure due to COVID 19: Chest tube out,trach removed 11/30. Steady improvement. 2. ESRD:New start this admission, on TTS- [facility] 09/30/19. Making some urine -still dialysis dependentat this point.SCr 4.8-5.9 EGFR 13-11.Will plan for permanent access once patient is more stable. 3.HTN/volume:BPcontrolledtoday. ContinueUF as tolerated - HD 12/05 Net UF 2.5 Post wt 79 kg. Volume  status stable . 4. Anemia:Hgb7.9.HxGI bleed this admission. No heparin with HD.Continue Aranesp and IV iron. Follow trend.  5. Secondary hyperparathyroidism:CorrCa slightly high. Not on calcium or vit D, use low Ca bath. Phos now at [facility] iPTH with next HD 09/30/19. 6.CVA:Bilatcerebral cortex and right cerebellum small acute to subacuteinfarctswithneuromuscular weakness/encephalopathy. 6. Dispo: Trach out/ Still very debilitated, will need ongoing PT/OT. Needs HD in chair.   Rita H. Brown NP-C 09/28/2019, 9:26 AM  [employer] [phone]

## 2019-09-29 LAB — CBC
HCT: 24.7 % — ABNORMAL LOW (ref 39.0–52.0)
Hemoglobin: 7.8 g/dL — ABNORMAL LOW (ref 13.0–17.0)
MCH: 28.6 pg (ref 26.0–34.0)
MCHC: 31.6 g/dL (ref 30.0–36.0)
MCV: 90.5 fL (ref 80.0–100.0)
Platelets: 408 10*3/uL — ABNORMAL HIGH (ref 150–400)
RBC: 2.73 MIL/uL — ABNORMAL LOW (ref 4.22–5.81)
RDW: 14.4 % (ref 11.5–15.5)
WBC: 13.8 10*3/uL — ABNORMAL HIGH (ref 4.0–10.5)
nRBC: 0.4 % — ABNORMAL HIGH (ref 0.0–0.2)

## 2019-09-29 LAB — GLUCOSE, CAPILLARY
Glucose-Capillary: 87 mg/dL (ref 70–99)
Glucose-Capillary: 90 mg/dL (ref 70–99)

## 2019-09-29 MED ORDER — PRO-STAT SUGAR FREE PO LIQD
30.0000 mL | Freq: Two times a day (BID) | ORAL | Status: DC
  Administered 2019-09-29 – 2019-10-03 (×9): 30 mL via ORAL
  Filled 2019-09-29 (×9): qty 30

## 2019-09-29 MED ORDER — NEPRO/CARBSTEADY PO LIQD
237.0000 mL | Freq: Two times a day (BID) | ORAL | Status: DC
  Administered 2019-09-29 – 2019-10-01 (×3): 237 mL via ORAL

## 2019-09-29 MED ORDER — CHLORHEXIDINE GLUCONATE CLOTH 2 % EX PADS
6.0000 | MEDICATED_PAD | Freq: Every day | CUTANEOUS | Status: DC
  Administered 2019-09-30 – 2019-10-03 (×4): 6 via TOPICAL

## 2019-09-29 MED ORDER — HYDROCODONE-ACETAMINOPHEN 7.5-325 MG/15ML PO SOLN
10.0000 mL | ORAL | Status: DC | PRN
  Administered 2019-09-29 – 2019-09-30 (×3): 10 mL via ORAL
  Filled 2019-09-29 (×4): qty 15

## 2019-09-29 MED ORDER — DARBEPOETIN ALFA 150 MCG/0.3ML IJ SOSY: 150.0000 ug | PREFILLED_SYRINGE | INTRAMUSCULAR | Status: DC

## 2019-09-29 NOTE — Plan of Care (Signed)
  Problem: Education: Goal: Knowledge of risk factors and measures for prevention of condition will improve Outcome: Progressing   Problem: Respiratory: Goal: Will maintain a patent airway Outcome: Progressing   Problem: Activity: Goal: Risk for activity intolerance will decrease Outcome: Progressing

## 2019-09-29 NOTE — Progress Notes (Signed)
Physical Therapy Treatment Patient Details Name: [name] MRN: [id number] DOB: 12-17-1950 Today's Date: 09/29/2019    History of Present Illness 68 y/o male diagnosed with COVID on 9/29 presented to the [facility] ED on 10/2 with dyspnea, hypoxemia requiring intubation.  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  Tracheostomy placed on 10/30. Tested COVID neg x2 on 11/2. MRI on 11/4 showed There is cortical restricted diffusion posteriorly in the right frontal lobe consistent with acute infarct. Additional smaller regions of diffusion abnormality are present in the posterior left frontal lobe, anterior left frontal lobe, and right greater than left medial parietal lobes as well as right cerebellum.     PT Comments    Pt progressing towards physical therapy goals. Focus of session was OOB transfer to chair for treatment team to assess tolerance for sitting during HD sessions. Maximove utilized for transfer to maximize energy conservation for max time OOB. Will continue to follow and progress as able per POC.    Follow Up Recommendations  CIR     Equipment Recommendations  Wheelchair (measurements PT);Wheelchair cushion (measurements PT);Hospital bed;Other (comment)    Recommendations for Other Services Rehab consult     Precautions / Restrictions Precautions Precautions: Fall Precaution Comments: skin breakdown on heels and joints Required Braces or Orthoses: Other Brace Other Brace: bilateral prevalon boots Restrictions Weight Bearing Restrictions: No Other Position/Activity Restrictions: elevate LUE when not exercising    Mobility  Bed Mobility Overal bed mobility: Needs Assistance Bed Mobility: Rolling Rolling: Max assist;Mod assist         General bed mobility comments: Heavy mod toward L, max to R  Transfers                 General  transfer comment: Maximove utilized for transition OOB to chair.   Ambulation/Gait                 Stairs             Wheelchair Mobility    Modified Rankin (Stroke Patients Only) Modified Rankin (Stroke Patients Only) Pre-Morbid Rankin Score: No symptoms Modified Rankin: Severe disability     Balance                                            Cognition Arousal/Alertness: Awake/alert Behavior During Therapy: Flat affect Overall Cognitive Status: Impaired/Different from baseline Area of Impairment: Memory;Safety/judgement;Following commands;Problem solving;Orientation;Attention                 Orientation Level: Disoriented to;Time Current Attention Level: Sustained Memory: Decreased short-term memory Following Commands: Follows one step commands with increased time Safety/Judgement: Decreased awareness of deficits;Decreased awareness of safety Awareness: Intellectual Problem Solving: Slow processing;Requires verbal cues General Comments: pt stating he turned 16 yesterday, he is 67      Exercises      General Comments        Pertinent Vitals/Pain Pain Assessment: Faces Faces Pain Scale: Hurts little more Pain Location: back Pain Descriptors / Indicators: Grimacing;Sore Pain Intervention(s): Limited activity within patient's tolerance;Monitored during session;Repositioned    Home Living                      Prior Function  PT Goals (current goals can now be found in the care plan section) Acute Rehab PT Goals Patient Stated Goal: Motivated to get OOB to chair PT Goal Formulation: With patient Time For Goal Achievement: 10/09/19 Potential to Achieve Goals: Fair Progress towards PT goals: Progressing toward goals    Frequency    Min 3X/week      PT Plan Current plan remains appropriate    Co-evaluation PT/OT/SLP Co-Evaluation/Treatment: Yes Reason for Co-Treatment: Complexity of the  patient's impairments (multi-system involvement);Necessary to address cognition/behavior during functional activity;For patient/therapist safety PT goals addressed during session: Mobility/safety with mobility;Balance;Proper use of DME;Strengthening/ROM OT goals addressed during session: ADL's and self-care      AM-PAC PT "6 Clicks" Mobility   Outcome Measure  Help needed turning from your back to your side while in a flat bed without using bedrails?: A Lot Help needed moving from lying on your back to sitting on the side of a flat bed without using bedrails?: Total Help needed moving to and from a bed to a chair (including a wheelchair)?: Total Help needed standing up from a chair using your arms (e.g., wheelchair or bedside chair)?: Total Help needed to walk in hospital room?: Total Help needed climbing 3-5 steps with a railing? : Total 6 Click Score: 7    End of Session Equipment Utilized During Treatment: Oxygen Activity Tolerance: Patient limited by fatigue Patient left: in chair;with call bell/phone within reach;with chair alarm set Nurse Communication: Mobility status PT Visit Diagnosis: Unsteadiness on feet (R26.81);Other abnormalities of gait and mobility (R26.89);Muscle weakness (generalized) (M62.81);Difficulty in walking, not elsewhere classified (R26.2)     Time: 2902-1115 PT Time Calculation (min) (ACUTE ONLY): 27 min  Charges:  $Therapeutic Activity: 8-22 mins                     [name], PT, DPT Acute Rehabilitation Services Pager: [phone] Office: [phone]    [name] 09/29/2019, 11:34 AM

## 2019-09-29 NOTE — Evaluation (Signed)
Occupational Therapy Evaluation Patient Details Name: [name] MRN: [id number] DOB: [birth year] Today's Date: 09/29/2019    History of Present Illness 68 y/o male diagnosed with COVID on 9/29 presented to the [facility] ED on 10/2 with dyspnea, hypoxemia requiring intubation.  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  Tracheostomy placed on 10/30. Tested COVID neg x2 on 11/2. MRI on 11/4 showed There is cortical restricted diffusion posteriorly in the right frontal lobe consistent with acute infarct. Additional smaller regions of diffusion abnormality are present in the posterior left frontal lobe, anterior left frontal lobe, and right greater than left medial parietal lobes as well as right cerebellum.    Clinical Impression   Pt lifted to chair using maximove. Pt pleased to be OOB. Participated in grooming once positioned in chair, with pillows on L side, with min assist. Educated pt to protect L UE with R during mobility. Pt willing to remain in chair for an hour.     Follow Up Recommendations  CIR    Equipment Recommendations  Other (comment)(defer to next venue)    Recommendations for Other Services       Precautions / Restrictions Precautions Precautions: Fall Precaution Comments: skin breakdown on heels and joints Required Braces or Orthoses: Other Brace Other Brace: bil prevalon boots      Mobility Bed Mobility Overal bed mobility: Needs Assistance Bed Mobility: Rolling Rolling: Min assist;Max assist         General bed mobility comments: min toward L, max to R  Transfers                      Balance                                           ADL either performed or assessed with clinical judgement   ADL       Grooming: Oral care;Cueing for UE precautions;Minimal assistance;Wash/dry face;Sitting Grooming  Details (indicate cue type and reason): in chair                                     Vision         Perception     Praxis      Pertinent Vitals/Pain Pain Assessment: Faces Faces Pain Scale: Hurts little more Pain Location: back Pain Descriptors / Indicators: Grimacing;Sore Pain Intervention(s): Monitored during session;Repositioned     Hand Dominance     Extremity/Trunk Assessment Upper Extremity Assessment LUE Deficits / Details: flaccid, cues to use R UE to protect L UE during transfer LUE Sensation: decreased proprioception LUE Coordination: decreased fine motor;decreased gross motor           Communication     Cognition Arousal/Alertness: Awake/alert Behavior During Therapy: Flat affect Overall Cognitive Status: Impaired/Different from baseline Area of Impairment: Memory;Safety/judgement;Following commands;Problem solving;Orientation;Attention                 Orientation Level: Disoriented to;Time Current Attention Level: Sustained Memory: Decreased short-term memory Following Commands: Follows one step commands with increased time Safety/Judgement: Decreased awareness of deficits;Decreased awareness of safety Awareness: Intellectual Problem Solving: Slow processing;Requires verbal cues General Comments: pt stating he turned 4 yesterday, he is 68  General Comments       Exercises     Shoulder Instructions      Home Living                                          Prior Functioning/Environment                   OT Problem List:        OT Treatment/Interventions:      OT Goals(Current goals can be found in the care plan section) Acute Rehab OT Goals Patient Stated Goal: agreeable to OOB to chair OT Goal Formulation: With patient Time For Goal Achievement: 10/09/19 Potential to Achieve Goals: Good  OT Frequency: Min 2X/week   Barriers to D/C:            Co-evaluation PT/OT/SLP  Co-Evaluation/Treatment: Yes Reason for Co-Treatment: Complexity of the patient's impairments (multi-system involvement);For patient/therapist safety   OT goals addressed during session: ADL's and self-care      AM-PAC OT "6 Clicks" Daily Activity     Outcome Measure Help from another person eating meals?: A Little Help from another person taking care of personal grooming?: A Little Help from another person toileting, which includes using toliet, bedpan, or urinal?: Total Help from another person bathing (including washing, rinsing, drying)?: Total Help from another person to put on and taking off regular upper body clothing?: Total Help from another person to put on and taking off regular lower body clothing?: Total 6 Click Score: 10   End of Session Nurse Communication: Need for lift equipment;Mobility status  Activity Tolerance: Patient tolerated treatment well Patient left: in chair;with call bell/phone within reach;with chair alarm set  OT Visit Diagnosis: Muscle weakness (generalized) (M62.81);Hemiplegia and hemiparesis;Other symptoms and signs involving cognitive function Hemiplegia - Right/Left: Left Hemiplegia - dominant/non-dominant: Non-Dominant Hemiplegia - caused by: Cerebral infarction                Time: 3329-5188 OT Time Calculation (min): 34 min Charges:  OT General Charges $OT Visit: 1 Visit OT Treatments $Self Care/Home Management : 8-22 mins  Nestor Lewandowsky, OTR/L Acute Rehabilitation Services Pager: [phone] Office: [phone] [name] 09/29/2019, 10:02 AM

## 2019-09-29 NOTE — Progress Notes (Signed)
  [facility] Progress Note   **Nephrology now acting as primary team for this patient due to COVID pandemic**  Subjective:  Seen in room - sitting in chair, looks a little uncomfortable. Denies CP/dyspnea.  Objective Vitals:   09/29/19 0434 09/29/19 0750 09/29/19 0841 09/29/19 0900  BP: 138/82 138/80    Pulse: 90 86 92   Resp: 18 18 18    Temp: 98.7 F (37.1 C) 98.6 F (37 C)    TempSrc:  Oral    SpO2: 97% 100% 99% 97%  Weight:      Height:       Physical Exam General: Frail appearing man, NAD Heart: RRR; no murmur Lungs: CTA anteriorly Abdomen: soft, non-tender. Foley in place - clear yellow urine in bag Extremities: No LE edema; in soft heel protectors Dialysis Access: L TDC without tenderness  Additional Objective Labs: Basic Metabolic Panel: Recent Labs  Lab 09/24/19 0503 09/25/19 0719 09/27/19 0524  NA 132* 131* 132*  K 4.0 4.0 3.9  CL 94* 93* 94*  CO2 25 26 27   GLUCOSE 79 95 84  BUN 27* 34* 25*  CREATININE 4.88* 5.90* 4.83*  CALCIUM 8.6* 8.9 9.0  PHOS 4.3 4.1 3.8   Liver Function Tests: Recent Labs  Lab 09/24/19 0503 09/25/19 0719 09/27/19 0524  ALBUMIN 1.7* 1.7* 1.8*   CBC: Recent Labs  Lab 09/24/19 0735 09/25/19 0143 09/25/19 1115 09/27/19 0524 09/29/19 0545  WBC 14.9* 13.3* 14.7* 13.5* 13.8*  HGB 8.6* 7.3* 8.2* 7.9* 7.8*  HCT 27.6* 22.9* 25.9* 25.9* 24.7*  MCV 90.8 89.5 90.6 90.9 90.5  PLT 350 349 366 391 408*   Medications: . sodium chloride Stopped (09/21/19 1659)   . aspirin EC  81 mg Oral Daily  . atorvastatin  40 mg Oral q1800  . budesonide (PULMICORT) nebulizer solution  0.5 mg Nebulization BID  . chlorhexidine  15 mL Mouth/Throat BID  . Chlorhexidine Gluconate Cloth  6 each Topical Q0600  . darbepoetin (ARANESP) injection - DIALYSIS  100 mcg Intravenous Q Sat-HD  . feeding supplement  1 Container Oral TID BM  . heparin injection (subcutaneous)  5,000 Units Subcutaneous Q8H  . multivitamin  1 tablet Oral QHS   . pantoprazole  40 mg Oral Q1200  . ramelteon  8 mg Oral QHS  . sucralfate  1 g Oral Q6H    Dialysis Orders: New start ESRD-> 4hr, 3K, TDC, 400/800, EDW pending.  Assessment/Plan: 1.S/p acute hypoxemia and respiratory failure due to COVID 19: Chest tube out,trach removed 11/30. Steady improvement. 2. ESRD:New start this admission, on TTSschedule.Making some urine -still dialysis dependentat this point.Will plan for permanent access once patient is more stable.CLIP pending. 3.HTN/volume:BPcontrolledtoday. Establishing EDW ~79kg. 4. Anemia:Hgb7.8.HxGI bleed this admission. No heparin with HD.Finished course of IV iron. Continue Aranesp q Sat - increase for next dose. 5. Secondary hyperparathyroidism:CorrCa slightly high. Not on calcium or vit D, use low Ca bath. Phos now at [facility] iPTH with next HD 09/30/19. 6.CVA:Bilatcerebral cortex and right cerebellum small acute to subacuteinfarctswithneuromuscular weakness/encephalopathy. 7. Nutrition: Alb very low - per nursing note, pt reports lack on taste (post-COVID). Add nepro + pro-stat supplements. 8. Dispo: Lurline Idol out. Still very debilitated, will need ongoing PT/OT - possibly to CIR.Needs HD in chair.    Veneta Penton, PA-C 09/29/2019, 11:57 AM  [facility] Pager: [phone]

## 2019-09-30 LAB — HEPATITIS B CORE ANTIBODY, TOTAL: Hep B Core Total Ab: NONREACTIVE

## 2019-09-30 LAB — RENAL FUNCTION PANEL
Albumin: 2.2 g/dL — ABNORMAL LOW (ref 3.5–5.0)
Anion gap: 12 (ref 5–15)
BUN: 36 mg/dL — ABNORMAL HIGH (ref 8–23)
CO2: 26 mmol/L (ref 22–32)
Calcium: 9.4 mg/dL (ref 8.9–10.3)
Chloride: 95 mmol/L — ABNORMAL LOW (ref 98–111)
Creatinine, Ser: 6.03 mg/dL — ABNORMAL HIGH (ref 0.61–1.24)
GFR calc Af Amer: 10 mL/min — ABNORMAL LOW (ref >60)
GFR calc non Af Amer: 9 mL/min — ABNORMAL LOW (ref >60)
Glucose, Bld: 93 mg/dL (ref 70–99)
Phosphorus: 4.2 mg/dL (ref 2.5–4.6)
Potassium: 3.9 mmol/L (ref 3.5–5.1)
Sodium: 133 mmol/L — ABNORMAL LOW (ref 135–145)

## 2019-09-30 LAB — GLUCOSE, CAPILLARY
Glucose-Capillary: 82 mg/dL (ref 70–99)
Glucose-Capillary: 115 mg/dL — ABNORMAL HIGH (ref 70–99)
Glucose-Capillary: 126 mg/dL — ABNORMAL HIGH (ref 70–99)

## 2019-09-30 LAB — HEPATITIS B SURFACE ANTIGEN: Hepatitis B Surface Ag: NONREACTIVE

## 2019-09-30 MED ORDER — ALTEPLASE 2 MG IJ SOLR
INTRAMUSCULAR | Status: AC
  Administered 2019-09-30: 4 mg via INTRAVENOUS_CENTRAL
  Filled 2019-09-30: qty 4

## 2019-09-30 NOTE — Progress Notes (Addendum)
[facility] Progress Note   **Nephrology now acting as primary team for this patient due to COVID pandemic**  Subjective: Seen at onset of HD - 1L UF goal and tolerating, may ^ goal if tolerates. Denies CP/dyspnea. Says not sleeping well at night and appetite still very low.   Objective Vitals:   09/29/19 1700 09/29/19 2038 09/29/19 2100 09/30/19 0547  BP:   (!) 148/84 (!) 158/95  Pulse:   82 82  Resp:   16 16  Temp:   98.1 F (36.7 C) 98.8 F (37.1 C)  TempSrc:   Oral Oral  SpO2: 93% 93% 94% 95%  Weight:    75.8 kg  Height:       Physical Exam General: Frail appearing man, NAD Heart: RRR; no murmur Lungs: CTA anteriorly Abdomen: soft, non-tender. Foley in place - no urine present in bag Extremities: No LE edema; in soft heel protectors Dialysis Access: L TDC without tenderness  Additional Objective Labs: Basic Metabolic Panel: Recent Labs  Lab 09/24/19 0503 09/25/19 0719 09/27/19 0524  NA 132* 131* 132*  K 4.0 4.0 3.9  CL 94* 93* 94*  CO2 25 26 27   GLUCOSE 79 95 84  BUN 27* 34* 25*  CREATININE 4.88* 5.90* 4.83*  CALCIUM 8.6* 8.9 9.0  PHOS 4.3 4.1 3.8   Liver Function Tests: Recent Labs  Lab 09/24/19 0503 09/25/19 0719 09/27/19 0524  ALBUMIN 1.7* 1.7* 1.8*   CBC: Recent Labs  Lab 09/24/19 0735 09/25/19 0143 09/25/19 1115 09/27/19 0524 09/29/19 0545  WBC 14.9* 13.3* 14.7* 13.5* 13.8*  HGB 8.6* 7.3* 8.2* 7.9* 7.8*  HCT 27.6* 22.9* 25.9* 25.9* 24.7*  MCV 90.8 89.5 90.6 90.9 90.5  PLT 350 349 366 391 408*   Medications: . sodium chloride Stopped (09/21/19 1659)   . aspirin EC  81 mg Oral Daily  . atorvastatin  40 mg Oral q1800  . budesonide (PULMICORT) nebulizer solution  0.5 mg Nebulization BID  . chlorhexidine  15 mL Mouth/Throat BID  . Chlorhexidine Gluconate Cloth  6 each Topical Q0600  . [START ON 10/04/2019] darbepoetin (ARANESP) injection - DIALYSIS  150 mcg Intravenous Q Sat-HD  . feeding supplement  1 Container Oral  TID BM  . feeding supplement (NEPRO CARB STEADY)  237 mL Oral BID BM  . feeding supplement (PRO-STAT SUGAR FREE 64)  30 mL Oral BID  . heparin injection (subcutaneous)  5,000 Units Subcutaneous Q8H  . multivitamin  1 tablet Oral QHS  . pantoprazole  40 mg Oral Q1200  . ramelteon  8 mg Oral QHS  . sucralfate  1 g Oral Q6H    Dialysis Orders: New start ESRD-> 4hr, 3K, TDC, 400/800, EDW pending.  Assessment/Plan: 1.S/p acute hypoxemia and respiratory failure due to COVID 19: multiple complications, prolonged ICU course. R chest tube out,trach removed 11/30. Steady improvement. 2. ESRD:New start this admission, on TTSschedule - HD today.Making some urine -still dialysis dependentat this.  Admitted 10/3 and started CRRT on 08/07/19, has been on RRT for a little under 2 mos now. Patient prefers to avoid perm access for now hoping that renal function will recover, which is reasonable at this point.CLIP pending. 3.HTN/volume:BPslightly high - follow with HD. 4. Anemia:Hgb7.8.HxGI bleed this admission. No heparin with HD.Finished course of IV iron. Continue Aranesp q Sat - increase for next dose. 5. Secondary hyperparathyroidism:CorrCa slightly high. Not on calcium or vit D, use low Ca bath. Phos nowatgoal.Check iPTH with next HD12/08/20. 6.CVA:Bilatcerebral cortex and right cerebellum small acute  to subacuteinfarctswithneuromuscular weakness/encephalopathy. 7. Nutrition: Alb very low - per nursing note, pt reports lack on taste (post-COVID). Added nepro + pro-stat supplements. 8. Sacral decub: Pressure ulcer - wound care following. 9. Dispo: Trach out. Still very debilitated, will need ongoing PT/OT - possibly to CIR.Needs HD in chair.    Veneta Penton, PA-C 09/30/2019, 8:26 AM  [facility] Pager: [phone]  Pt seen, examined and agree w A/P as above.  [name]  MD 09/30/2019, 10:27 AM

## 2019-09-30 NOTE — Progress Notes (Signed)
Inpatient Rehab Admissions Coordinator:   Met with pt in dialysis.  He states he was in the chair all night last night.  Unsure of accuracy of this statement.  Encouraged pt to continue working hard with therapies.  CLIP pending and when finalized we can proceed with opening insurance for authorization for CIR. Continue to follow.    , PT, DPT Admissions Coordinator [phone] 09/30/19  11:57 AM   

## 2019-09-30 NOTE — Progress Notes (Signed)
Renal Navigator met with patient at bedside. He states he is feeling well and reports that he is doing well with sitting up in a chair for periods of time. This is report from nursing staff also. Nephrology, who is also patient's primary at this time, feels patient is appropriate to refer for OP HD treatment at this point and this was discussed with patient. Per Nephrologist, patient does not want permanent access yet, as his kidneys have been damaged from COVID infection, he has been making some urine, and hoping for recovery. Patient asked Renal Navigator to speak with his wife regarding OP HD clinic referral. Renal Navigator spoke with [name] Vahle/patient's wife, who was unable to talk at this time, but called Navigator back shortly. She was given choice in OP HD clinics in [place]. She would like to stay with the MD group who has cared for patient while he has been in the hospital and, therefore, Renal Navigator will complete referral to [facility]missions (tomorrow, as they are no longer taking referrals at this point in the day).   ,  Elizabeth, LCSW Renal Navigator [phone] 

## 2019-09-30 NOTE — Plan of Care (Signed)
  Problem: Respiratory: Goal: Complications related to the disease process, condition or treatment will be avoided or minimized Outcome: Progressing   Problem: Activity: Goal: Risk for activity intolerance will decrease Outcome: Progressing   Problem: Skin Integrity: Goal: Risk for impaired skin integrity will decrease Outcome: Progressing

## 2019-10-01 LAB — CBC
HCT: 26.9 % — ABNORMAL LOW (ref 39.0–52.0)
Hemoglobin: 8.3 g/dL — ABNORMAL LOW (ref 13.0–17.0)
MCH: 28.3 pg (ref 26.0–34.0)
MCHC: 30.9 g/dL (ref 30.0–36.0)
MCV: 91.8 fL (ref 80.0–100.0)
Platelets: 350 10*3/uL (ref 150–400)
RBC: 2.93 MIL/uL — ABNORMAL LOW (ref 4.22–5.81)
RDW: 15 % (ref 11.5–15.5)
WBC: 13.6 10*3/uL — ABNORMAL HIGH (ref 4.0–10.5)
nRBC: 0.2 % (ref 0.0–0.2)

## 2019-10-01 LAB — GLUCOSE, CAPILLARY
Glucose-Capillary: 86 mg/dL (ref 70–99)
Glucose-Capillary: 91 mg/dL (ref 70–99)
Glucose-Capillary: 91 mg/dL (ref 70–99)

## 2019-10-01 LAB — HEPATITIS B SURFACE ANTIBODY, QUANTITATIVE: Hep B S AB Quant (Post): <3.1 m[IU]/mL — ABNORMAL LOW (ref >9.9)

## 2019-10-01 MED ORDER — NEPRO/CARBSTEADY PO LIQD
237.0000 mL | Freq: Three times a day (TID) | ORAL | Status: DC
  Administered 2019-10-01 – 2019-10-03 (×6): 237 mL via ORAL

## 2019-10-01 NOTE — Progress Notes (Signed)
Nutrition Follow-up  DOCUMENTATION CODES:   Not applicable  INTERVENTION:   -D/C Boost Breeze    Increase Nepro Shake po TID, each supplement provides 425 kcal and 19 grams protein  Continue 30 ml Prostat BID, each supplement provides 100 kcals and 15 grams protein.   Continue renal MVI  NUTRITION DIAGNOSIS:   Increased nutrient needs related to chronic illness as evidenced by estimated needs.  Ongoing  GOAL:   Patient will meet greater than or equal to 90% of their needs  Progressing   MONITOR:   PO intake, Supplement acceptance, Diet advancement, Labs, Weight trends, Skin, I & O's  REASON FOR ASSESSMENT:   Consult Enteral/tube feeding initiation and management  ASSESSMENT:   68 y/o male admitted with PNA and COVID 19  11/18 Diet advanced to Dysphagia I, Honey Thick 11/21- TF d/c 11/22- advanced to dysphagia 2 diet with nectar thick liquids 11/27- downsized to size 6 cuffless trach today 11/30- Cortrak pulled (clogged), decannulation   12/2- diet advanced to regular with thin liquids   Pt reports his appetite fluctuates due to taste changes s/p COVID. Meal completions charted as 5-100% for his last four meals. Switching between Nepro and Boost supplementation. Weight continues to trend down.  Encouraged pt to use Nepro supplements vs Boost as it contains higher protein/kcal content that will likely promote weight maintainance/gain. Pt taking Prostat BID.    Admission weight: 96 kg Current weight: 74.1 kg (down 6 kg over the last week)  I/O: +82 ml since 11/25 UOP: 200 ml x 24 hrs  Last HD 12/8: 950 ml net UF   Medications: aranesp, rena-vit  Labs: Na 133 (L) CBG 82-126  Diet Order:   Diet Order            Diet regular Room service appropriate? Yes; Fluid consistency: Thin  Diet effective now              EDUCATION NEEDS:   No education needs have been identified at this time  Skin:  Skin Assessment: Skin Integrity Issues: Skin Integrity  Issues:: Other (Comment), Incisions, Stage II DTI: - Stage I: - Stage II: perineum, sacrum Incisions: rt lateral lower chest Other: MASD buttocks, penis, open penis sore, lt neck, lt chest  Last BM:  12/7  Height:   Ht Readings from Last 1 Encounters:  09/07/19 5\' 10"  (1.778 m)    Weight:   Wt Readings from Last 1 Encounters:  10/01/19 74.1 kg    Ideal Body Weight:  75.45 kg  BMI:  Body mass index is 23.44 kg/m.  Estimated Nutritional Needs:   Kcal:  6384-5364  Protein:  130-150 gm  Fluid:  1000 mL plus UOP   [name] RD, LDN Clinical Nutrition Pager # [phone]

## 2019-10-01 NOTE — Progress Notes (Signed)
OP HD referral completed and submitted to [facility]. Renal Navigator will follow closely.  Alphonzo Cruise, [place] Renal Navigator [phone]

## 2019-10-01 NOTE — Progress Notes (Addendum)
Occupational Therapy Treatment Patient Details Name: [name] MRN: [id number] DOB: 08/17/1951 Today's Date: 10/01/2019    History of present illness 68 y/o male diagnosed with COVID on 9/29 presented to the [facility] ED on 10/2 with dyspnea, hypoxemia requiring intubation.  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  Tracheostomy placed on 10/30. Tested COVID neg x2 on 11/2. MRI on 11/4 showed There is cortical restricted diffusion posteriorly in the right frontal lobe consistent with acute infarct. Additional smaller regions of diffusion abnormality are present in the posterior left frontal lobe, anterior left frontal lobe, and right greater than left medial parietal lobes as well as right cerebellum.    OT comments  Pt reports tolerating several hours in chair when up on Monday. Recommended pt limit time in chair due to pressure area on buttocks. Pt continues to progress with rolling and sitting balance/midline orientation once positioned in chair. Pt seated on pressure relief cushion. Demonstrated some active movement in L hand. Pt is eager to get to rehab and progress home. VSS throughout session.  Follow Up Recommendations  CIR    Equipment Recommendations       Recommendations for Other Services      Precautions / Restrictions Precautions Precautions: Fall Precaution Comments: skin breakdown on heels, joints & sacrum Other Brace: bilateral prevalon boots Restrictions Weight Bearing Restrictions: No Other Position/Activity Restrictions: elevate LUE when not exercising       Mobility Bed Mobility Overal bed mobility: Needs Assistance Bed Mobility: Rolling Rolling: Min assist;Mod assist         General bed mobility comments: max cuing for hemi technique & attend to LUE when rolling R  Transfers Overall transfer level: Needs assistance                General transfer comment: Maximove utilized for transition OOB to chair.     Balance                                           ADL either performed or assessed with clinical judgement   ADL                               Toileting- Clothing Manipulation and Hygiene: Total assistance;+2 for physical assistance;Bed level Toileting - Clothing Manipulation Details (indicate cue type and reason): RN changed dressing on buttocks wound       General ADL Comments: Pt is self feeding with set up when in chair.     Vision       Perception     Praxis      Cognition Arousal/Alertness: Awake/alert Behavior During Therapy: Flat affect Overall Cognitive Status: Impaired/Different from baseline Area of Impairment: Memory;Safety/judgement;Following commands;Problem solving;Orientation;Attention;Awareness                 Orientation Level: Disoriented to;Time Current Attention Level: Sustained Memory: Decreased short-term memory Following Commands: Follows one step commands with increased time Safety/Judgement: Decreased awareness of safety;Decreased awareness of deficits Awareness: Intellectual Problem Solving: Slow processing;Requires verbal cues General Comments: pt stating he recently turned 68, he is 72        Exercises     Shoulder Instructions       General Comments Pt reports & demonstrates  ability to lift LLE slightly off of bed, pt also with iimproved midline orientation (per OT) once sitting in recliner with back support. Encouraged pt to perform tasks for himself (reclining in chair & elevating feet) with assistance PRN.    Pertinent Vitals/ Pain       Pain Assessment: Faces Faces Pain Scale: Hurts little more Pain Location: LLE when lowering back down onto bed after lifting it Pain Descriptors / Indicators: Grimacing Pain Intervention(s): Monitored during session;Repositioned  Home Living                                           Prior Functioning/Environment              Frequency  Min 2X/week        Progress Toward Goals  OT Goals(current goals can now be found in the care plan section)  Progress towards OT goals: Progressing toward goals  Acute Rehab OT Goals Patient Stated Goal: Motivated to get OOB to chair OT Goal Formulation: With patient Time For Goal Achievement: 10/09/19 Potential to Achieve Goals: Good  Plan Discharge plan remains appropriate;Frequency remains appropriate    Co-evaluation    PT/OT/SLP Co-Evaluation/Treatment: Yes Reason for Co-Treatment: For patient/therapist safety PT goals addressed during session: Mobility/safety with mobility OT goals addressed during session: Strengthening/ROM      AM-PAC OT "6 Clicks" Daily Activity     Outcome Measure   Help from another person eating meals?: A Little Help from another person taking care of personal grooming?: A Little Help from another person toileting, which includes using toliet, bedpan, or urinal?: Total Help from another person bathing (including washing, rinsing, drying)?: Total Help from another person to put on and taking off regular upper body clothing?: Total Help from another person to put on and taking off regular lower body clothing?: Total 6 Click Score: 10    End of Session    OT Visit Diagnosis: Muscle weakness (generalized) (M62.81);Hemiplegia and hemiparesis;Other symptoms and signs involving cognitive function Hemiplegia - Right/Left: Left Hemiplegia - dominant/non-dominant: Non-Dominant Hemiplegia - caused by: Cerebral infarction   Activity Tolerance Patient tolerated treatment well   Patient Left in chair;with call bell/phone within reach;with chair alarm set;with family/visitor present   Nurse Communication Need for lift equipment;Mobility status(decreased time sitting to protect buttocks)        Time: 0600-4599 OT Time Calculation (min): 32 min  Charges: OT  General Charges $OT Visit: 1 Visit OT Treatments $Therapeutic Activity: 8-22 mins  Nestor Lewandowsky, OTR/L Acute Rehabilitation Services Pager: [phone] Office: [phone]   [name] 10/01/2019, 2:27 PM

## 2019-10-01 NOTE — Progress Notes (Addendum)
[facility] Progress Note   **Nephrology now acting as primary team for this patient due to COVID pandemic**  Subjective:  Seen in room - working on breakfast, appetite still low. Encouraged diet and protein supplement. No CP/dyspnea. Has been sitting in room. CLIP process started for outpatient HD slot - required prior to CIR evaluation.  Objective Vitals:   09/30/19 2102 10/01/19 0524 10/01/19 0804 10/01/19 0838  BP:  (!) 156/80  (!) 139/91  Pulse:  88  (!) 102  Resp:  18  18  Temp:  99.1 F (37.3 C)  98.8 F (37.1 C)  TempSrc:  Oral  Oral  SpO2: 95% 94% 93% 97%  Weight:  74.1 kg    Height:       Physical Exam General:Frail appearing man, NAD Heart:RRR; no murmur Lungs:CTA anteriorly Abdomen:soft, non-tender. Foley in place - no urine present in bag Extremities:No LE edema; in soft heel protectors Dialysis Access:L TDC without tenderness  Additional Objective Labs: Basic Metabolic Panel: Recent Labs  Lab 09/25/19 0719 09/27/19 0524 09/30/19 0800  NA 131* 132* 133*  K 4.0 3.9 3.9  CL 93* 94* 95*  CO2 26 27 26   GLUCOSE 95 84 93  BUN 34* 25* 36*  CREATININE 5.90* 4.83* 6.03*  CALCIUM 8.9 9.0 9.4  PHOS 4.1 3.8 4.2   Liver Function Tests: Recent Labs  Lab 09/25/19 0719 09/27/19 0524 09/30/19 0800  ALBUMIN 1.7* 1.8* 2.2*   CBC: Recent Labs  Lab 09/25/19 0143 09/25/19 1115 09/27/19 0524 09/29/19 0545 10/01/19 0511  WBC 13.3* 14.7* 13.5* 13.8* 13.6*  HGB 7.3* 8.2* 7.9* 7.8* 8.3*  HCT 22.9* 25.9* 25.9* 24.7* 26.9*  MCV 89.5 90.6 90.9 90.5 91.8  PLT 349 366 391 408* 350   CBG: Recent Labs  Lab 09/29/19 1130 09/30/19 0740 09/30/19 1620 09/30/19 2052 10/01/19 0728  GLUCAP 90 82 115* 126* 86   Medications: . sodium chloride Stopped (09/21/19 1659)   . aspirin EC  81 mg Oral Daily  . atorvastatin  40 mg Oral q1800  . budesonide (PULMICORT) nebulizer solution  0.5 mg Nebulization BID  . chlorhexidine  15 mL Mouth/Throat  BID  . Chlorhexidine Gluconate Cloth  6 each Topical Q0600  . [START ON 10/04/2019] darbepoetin (ARANESP) injection - DIALYSIS  150 mcg Intravenous Q Sat-HD  . feeding supplement  1 Container Oral TID BM  . feeding supplement (NEPRO CARB STEADY)  237 mL Oral BID BM  . feeding supplement (PRO-STAT SUGAR FREE 64)  30 mL Oral BID  . heparin injection (subcutaneous)  5,000 Units Subcutaneous Q8H  . multivitamin  1 tablet Oral QHS  . pantoprazole  40 mg Oral Q1200  . ramelteon  8 mg Oral QHS  . sucralfate  1 g Oral Q6H    Dialysis Orders: New start ESRD-> 4hr, 3K, TDC, 400/800, EDW pending.  Assessment/Plan: 1.S/p acute hypoxemia and respiratory failure due to COVID 19: multiple complications, prolonged ICU course. R chest tube out,trach removed 11/30. Steady improvement. 2. ESRD:New start this admission, on TTSschedule - HD 12/10.  Admitted 10/3 and started CRRT on 08/07/19, has been on RRT for a little under 2 mos now. Patient prefers to avoid perm access for now hoping that renal function will recover.CLIP pending. 3.HTN/volume:BPvariable - follow with HD. Ave UF 1-5. 2L here.  4. Anemia:Hgb8.3.HxGI bleed this admission. No heparin with HD.S/p IV iron. Continue Aranesp 154mcg q Sat. 5. Secondary hyperparathyroidism:CorrCa slightly high. Not on calcium or vit D, use low Ca bath. Phos  nowatgoal.Check iPTH with next HD. 6.CVA:Bilatcerebral cortex and right cerebellum small acute to subacuteinfarctswithneuromuscular weakness/encephalopathy. 7. Nutrition: Alb very low - per nursing note, pt reports lack on taste (post-COVID). Added nepro + pro-stat supplements. 8.Sacral decub: Pressure ulcer - wound care following. 9. Dispo: Trach out. Still very debilitated, will need ongoing PT/OT- possibly to CIR.Needs HD in chair.  Veneta Penton, PA-C 10/01/2019, 11:21 AM  [facility] Pager: [phone]  Pt seen, examined and agree w A/P as above.  [name]  MD 10/01/2019, 11:40 AM

## 2019-10-01 NOTE — Progress Notes (Signed)
Physical Therapy Treatment Patient Details Name: [name] MRN: [id number] DOB: 1951-09-22 Today's Date: 10/01/2019    History of Present Illness 68 y/o male diagnosed with COVID on 9/29 presented to the [facility] ED on 10/2 with dyspnea, hypoxemia requiring intubation.  Admitted to [facility], developed AKI requiring CRRT, pneumothorax requiring chest tube and had a large bronchopleural fistula treated for 10 days with a bronchial blocker.  Tracheostomy placed on 10/30. Tested COVID neg x2 on 11/2. MRI on 11/4 showed There is cortical restricted diffusion posteriorly in the right frontal lobe consistent with acute infarct. Additional smaller regions of diffusion abnormality are present in the posterior left frontal lobe, anterior left frontal lobe, and right greater than left medial parietal lobes as well as right cerebellum.     PT Comments    Pt very motivated to participate & get OOB to recliner, reporting he stayed in recliner for several hours after previous therapist got him there. Pt requires decreased assistance for rolling on this date (only min<>mod) with hospital bed features. Pt with draining wound on sacrum & instructed to only remain in chair ~90 minutes before getting back to bed (wife & nurse made aware of this). Per OT, pt with improved midline orientation when OOB in recliner on this date. Pt remains motivated to get better & is eager to go to CIR. Will continue to follow pt acutely to focus on bed mobility, transfers, & activity tolerance.  Pt on room air during session & SpO2 >90% after transferring to recliner with maximove.   Follow Up Recommendations  CIR     Equipment Recommendations  (TBD in next venue)    Recommendations for Other Services Rehab consult     Precautions / Restrictions Precautions Precautions: Fall Precaution Comments: skin breakdown on heels, joints & sacrum Other Brace: bilateral prevalon boots Restrictions Weight Bearing Restrictions: No Other  Position/Activity Restrictions: elevate LUE when not exercising    Mobility  Bed Mobility Overal bed mobility: Needs Assistance Bed Mobility: Rolling Rolling: Min assist;Mod assist(min assist roll L with bed rails & HOB slightly elevated, mod assist to roll R)         General bed mobility comments: max cuing for hemi technique & attend to LUE when rolling R  Transfers Overall transfer level: Needs assistance               General transfer comment: Maximove utilized for transition OOB to chair.   Ambulation/Gait                 Stairs             Wheelchair Mobility    Modified Rankin (Stroke Patients Only)       Balance                                            Cognition Arousal/Alertness: Awake/alert Behavior During Therapy: Flat affect Overall Cognitive Status: Impaired/Different from baseline Area of Impairment: Memory;Safety/judgement;Following commands;Problem solving;Orientation;Attention;Awareness                 Orientation Level: Disoriented to;Time   Memory: Decreased short-term memory Following Commands: Follows one step commands with increased time Safety/Judgement: Decreased awareness of safety;Decreased awareness of deficits Awareness: Intellectual Problem Solving: Slow processing;Requires verbal cues General Comments: pt stating he recently turned 57, he is 57      Exercises  General Comments General comments (skin integrity, edema, etc.): Pt reports & demonstrates ability to lift LLE slightly off of bed, pt also with iimproved midline orientation (per OT) once sitting in recliner with back support. Encouraged pt to perform tasks for himself (reclining in chair & elevating feet) with assistance PRN.      Pertinent Vitals/Pain Pain Assessment: Faces Faces Pain Scale: Hurts little more Pain Location: LLE when lowering back down onto bed after lifting it Pain Descriptors / Indicators:  Grimacing Pain Intervention(s): Limited activity within patient's tolerance;Monitored during session;Repositioned    Home Living                      Prior Function            PT Goals (current goals can now be found in the care plan section) Acute Rehab PT Goals Patient Stated Goal: Motivated to get OOB to chair PT Goal Formulation: With patient Time For Goal Achievement: 10/09/19 Potential to Achieve Goals: Good Progress towards PT goals: Progressing toward goals    Frequency    Min 3X/week      PT Plan Current plan remains appropriate    Co-evaluation PT/OT/SLP Co-Evaluation/Treatment: Yes Reason for Co-Treatment: Complexity of the patient's impairments (multi-system involvement);For patient/therapist safety;To address functional/ADL transfers PT goals addressed during session: Mobility/safety with mobility        AM-PAC PT "6 Clicks" Mobility   Outcome Measure  Help needed turning from your back to your side while in a flat bed without using bedrails?: A Lot Help needed moving from lying on your back to sitting on the side of a flat bed without using bedrails?: Total Help needed moving to and from a bed to a chair (including a wheelchair)?: Total Help needed standing up from a chair using your arms (e.g., wheelchair or bedside chair)?: Total Help needed to walk in hospital room?: Total Help needed climbing 3-5 steps with a railing? : Total 6 Click Score: 7    End of Session   Activity Tolerance: Patient tolerated treatment well Patient left: in chair;with call bell/phone within reach;with chair alarm set;with family/visitor present Nurse Communication: Mobility status(draining wound on sacrum (nurse assessed)) PT Visit Diagnosis: Unsteadiness on feet (R26.81);Other abnormalities of gait and mobility (R26.89);Muscle weakness (generalized) (M62.81);Difficulty in walking, not elsewhere classified (R26.2)     Time: 2025-4270 PT Time Calculation (min)  (ACUTE ONLY): 31 min  Charges:  $Therapeutic Activity: 8-22 mins                         Waunita Schooner, PT, DPTP 10/01/2019, 1:47 PM

## 2019-10-02 LAB — RENAL FUNCTION PANEL
Albumin: 2 g/dL — ABNORMAL LOW (ref 3.5–5.0)
Anion gap: 10 (ref 5–15)
BUN: 33 mg/dL — ABNORMAL HIGH (ref 8–23)
CO2: 28 mmol/L (ref 22–32)
Calcium: 9.3 mg/dL (ref 8.9–10.3)
Chloride: 95 mmol/L — ABNORMAL LOW (ref 98–111)
Creatinine, Ser: 5.04 mg/dL — ABNORMAL HIGH (ref 0.61–1.24)
GFR calc Af Amer: 13 mL/min — ABNORMAL LOW (ref >60)
GFR calc non Af Amer: 11 mL/min — ABNORMAL LOW (ref >60)
Glucose, Bld: 104 mg/dL — ABNORMAL HIGH (ref 70–99)
Phosphorus: 3 mg/dL (ref 2.5–4.6)
Potassium: 3.9 mmol/L (ref 3.5–5.1)
Sodium: 133 mmol/L — ABNORMAL LOW (ref 135–145)

## 2019-10-02 LAB — CBC
HCT: 25 % — ABNORMAL LOW (ref 39.0–52.0)
Hemoglobin: 7.7 g/dL — ABNORMAL LOW (ref 13.0–17.0)
MCH: 28.5 pg (ref 26.0–34.0)
MCHC: 30.8 g/dL (ref 30.0–36.0)
MCV: 92.6 fL (ref 80.0–100.0)
Platelets: 362 10*3/uL (ref 150–400)
RBC: 2.7 MIL/uL — ABNORMAL LOW (ref 4.22–5.81)
RDW: 15.1 % (ref 11.5–15.5)
WBC: 13.4 10*3/uL — ABNORMAL HIGH (ref 4.0–10.5)
nRBC: 0 % (ref 0.0–0.2)

## 2019-10-02 LAB — GLUCOSE, CAPILLARY
Glucose-Capillary: 130 mg/dL — ABNORMAL HIGH (ref 70–99)
Glucose-Capillary: 92 mg/dL (ref 70–99)
Glucose-Capillary: 86 mg/dL (ref 70–99)
Glucose-Capillary: 125 mg/dL — ABNORMAL HIGH (ref 70–99)

## 2019-10-02 MED ORDER — HEPARIN SODIUM (PORCINE) 1000 UNIT/ML IJ SOLN
INTRAMUSCULAR | Status: AC
  Filled 2019-10-02: qty 4

## 2019-10-02 NOTE — Progress Notes (Signed)
  Speech Language Pathology Treatment: Dysphagia  Patient Details Name: [name] MRN: [id number] DOB: 12-31-1950 Today's Date: 10/02/2019 Time: 0940-7680 SLP Time Calculation (min) (ACUTE ONLY): 21 min  Assessment / Plan / Recommendation Clinical Impression  Patient finishing lunch when entering room. He had eaten 1/2 of his sandwich and chips and reports no problems swallowing. He didn't finish his sandwich due to the dryness (no condiments). Pt's taste has not returned and he reports a reduction in appetite but his wife "makes" him eat to build his strength. His voice continues to be hoarse and thought to be related to a chronic cough since his Covid-19 diagnosis. On observation of reg diet and thin liquids, pt had no throat clear, cough or wet vocal quality. His respiratory status remained stable. Recommend pt continue with current diet. Medications can be given whole in puree.    HPI HPI: 68 year old with a history of GERD and BPH who presented to [facility], ED with S OB and was found to be hypoxic.  He was initially diagnosed with COVID-19 September 20.  1 to 2 days prior to his presentation he developed worsening shortness of breath with. Admitted on 10/3, intubated.  EGD on 10/17 showed normal esophagus but excessive gastric fluid. CT scan of the chest on 10/21 showed findings consistent with an empyema as well as persistent pneumothorax.  Bedside bronchoscopy revealed material in the airway. Trached on 10/30. On ATC by 10/31.Additionally MRI on 11/4 showed There is cortical restricted diffusion posteriorly in the right frontal lobe consistent with acute infarct. Additional smaller regions of diffusion abnormality are present in the posterior left frontal lobe, anterior left frontal lobe, and right greater than left medial parietal lobes as well as right cerebellum. Pt now decannulated on 11/30.       SLP Plan  Continue with current plan of care       Recommendations  Diet  recommendations: Regular;Thin liquid Liquids provided via: Cup;Straw Medication Administration: Whole meds with puree Supervision: Patient able to self feed Compensations: Slow rate;Small sips/bites Postural Changes and/or Swallow Maneuvers: Seated upright 90 degrees;Upright 30-60 min after meal                Oral Care Recommendations: Oral care BID Follow up Recommendations: Inpatient Rehab SLP Visit Diagnosis: Dysphagia, oropharyngeal phase (R13.12) Plan: Continue with current plan of care       Vev[place], MA, CCC-SLP 10/02/2019, 2:16 PM

## 2019-10-02 NOTE — PMR Pre-admission (Signed)
PMR Admission Coordinator Pre-Admission Assessment  Patient: [name] is an 68 y.o., male MRN: [id number] DOB: 03-21-1951 Height: '5\' 10"'$  (177.8 cm) Weight: 74.1 kg              Insurance Information HMO: yes    PPO:      PCP:      IPA:      80/20:      OTHER:  PRIMARY: [employer]      Policy#: [id number]      Subscriber: pt CM Name: Cristina Gong      Phone#: [phone]     Fax#: [phone] Pre-Cert#: [id number] [place] for CIR admission provided by Dot with [facility], approved for 7 days with updates due to [address] [facility] on 12/17 to fax listed above      Employer: n/a Benefits:  Phone #: [phone]     Name:  Eff. Date: 10/23/2017     Deduct: $0 for in network providers      Out of Pocket Max: $3,400 (met $315.00)      Life Max: n/a CIR: $295/day for days 1-6      SNF:  20 full days Outpatient:     Co-Pay: $10-$40/visit, limited by medical necessity Home Health: 100%      Co-Pay:  DME: 80%     Co-Pay: 20% Providers: in network SECONDARY:       Policy#:       Subscriber:  CM Name:       Phone#:      Fax#:  Pre-Cert#:       Employer:  Benefits:  Phone #:      Name:  Eff. Date:      Deduct:       Out of Pocket Max:       Life Max:  CIR:       SNF:  Outpatient:      Co-Pay:  Home Health:       Co-Pay:  DME:      Co-Pay:   Medicaid Application Date:       Case Manager:  Disability Application Date:       Case Worker:   The "Data Collection Information Summary" for patients in Inpatient Rehabilitation Facilities with attached "Privacy Act [place] Records" was provided and verbally reviewed with: Patient and Family  Emergency Contact Information Contact Information    Name Relation Home Work Mobile   [place] S Spouse [place]   [place] Daughter   [phone]   Masao, Junker Son   [phone]   Collin, Rengel   [phone]     Current Medical History  Patient Admitting Diagnosis: CVA, post-Covid-19 debility  History of  Present Illness: [name] is a 68 y.o. male originally diagnosed with COVID 19 on 07/13/19 with progressive tachypnea and SOB therefore admitted to [facility] on 07/26/19 and treated with Remdesivir, CCP, decadron, Vancomycin/Cefepime and required intubation due to ARDs.  Hospital course significant for MSSA bacteremia with volume overload, hypotensive shock, AKI with metabolic acidosis requiring CRRT, Afib with RVR, melena due to gastric ulcer with ABLA treated with clipping by Dr. Cristina Gong. He developed PTX with empyema and large bronchopleural fistula with persistent leak requiring chest tube but continued to have persistent leak.  He underwent tracheostomy on 08/22/19, and was transitioned to [facility] for ongoing HD with pressor support. Dr. Roxan Hockey followed for PTX and signed off on 11/17.  He developed decreased in LOC with inability to follow commands on 11/4 and was  found to have acute/subacute infarcts involving bilateral cerebral cortex and right cerebellum. EEG showed moderate diffuse encephalopathy and Dr. Erlinda Hong felt that stroke likely due to hypotension secondary to HD and sepsis --PAF could be potential etiology --ASA recommended for secondary stroke prevention with SBP range 120-140. Mental status changes felt to be due to encephalopathy as EEG negative for seizures. HD catheter placed by Dr. Vernard Gambles on 11/10.  Family elected on full scope of care and chest tube d/c 11/16, he has been decannulated on He was started on diet and has been advanced to dysphagia 2, nectars to allow for more palatable options. He had a prolonged course with downsizing trach 2/2 secretions, but was successfully decannulated on 11/30. Therapy ongoing and patient with delayed processing, difficulty sequencing, limited vocalization and mobilizing out of bed to chair with therapy. CIR recommended due to sigificant functional deficits post Covid-19 and CVA.       Past Medical History  Past Medical History:  Diagnosis Date  .  COVID-19   . Heartburn     Family History  family history is not on file.  Prior Rehab/Hospitalizations:  Has the patient had prior rehab or hospitalizations prior to admission? No  Has the patient had major surgery during 100 days prior to admission? Yes  Current Medications   Current Facility-Administered Medications:  .  0.9 %  sodium chloride infusion, , Intravenous, PRN, Desiree Hane, MD, Stopped at 09/21/19 1659 .  acetaminophen (TYLENOL) solution 650 mg, 650 mg, Per Tube, Q4H PRN, Oretha Milch D, MD, 650 mg at 09/22/19 0506 .  aspirin EC tablet 81 mg, 81 mg, Oral, Daily, Gonfa, Taye T, MD, 81 mg at 10/02/19 1201 .  atorvastatin (LIPITOR) tablet 40 mg, 40 mg, Oral, q1800, Oretha Milch D, MD, 40 mg at 10/02/19 1645 .  budesonide (PULMICORT) nebulizer solution 0.5 mg, 0.5 mg, Nebulization, BID, Vasireddy, Grier Mitts, MD, 0.5 mg at 10/03/19 0835 .  chlorhexidine (PERIDEX) 0.12 % solution 15 mL, 15 mL, Mouth/Throat, BID, Oretha Milch D, MD, 15 mL at 10/03/19 0824 .  Chlorhexidine Gluconate Cloth 2 % PADS 6 each, 6 each, Topical, Q0600, Loren Racer, PA-C, 6 each at 10/02/19 1324 .  [START ON 10/04/2019] Darbepoetin Alfa (ARANESP) injection 150 mcg, 150 mcg, Intravenous, Q Sat-HD, Stovall, Kathryn R, PA-C .  feeding supplement (NEPRO CARB STEADY) liquid 237 mL, 237 mL, Oral, TID BM, Roney Jaffe, MD, 237 mL at 10/02/19 2119 .  feeding supplement (PRO-STAT SUGAR FREE 64) liquid 30 mL, 30 mL, Oral, BID, Loren Racer, PA-C, 30 mL at 10/02/19 2120 .  guaiFENesin-dextromethorphan (ROBITUSSIN DM) 100-10 MG/5ML syrup 5 mL, 5 mL, Per Tube, Q4H PRN, Kirby-Graham, Karsten Fells, NP, 5 mL at 09/30/19 0338 .  heparin injection 5,000 Units, 5,000 Units, Subcutaneous, Q8H, Oretha Milch D, MD, 5,000 Units at 10/03/19 0610 .  HYDROcodone-acetaminophen (HYCET) 7.5-325 mg/15 ml solution 10 mL, 10 mL, Oral, Q4H PRN, Roney Jaffe, MD, 10 mL at 09/30/19 0337 .  ipratropium-albuterol  (DUONEB) 0.5-2.5 (3) MG/3ML nebulizer solution 3 mL, 3 mL, Nebulization, Q4H PRN, Gonfa, Taye T, MD .  multivitamin (RENA-VIT) tablet 1 tablet, 1 tablet, Oral, QHS, Hongalgi, Lenis Dickinson, MD, 1 tablet at 10/02/19 2119 .  ondansetron (ZOFRAN) injection 4 mg, 4 mg, Intravenous, Q6H PRN, Kyere, Belinda K, NP .  pantoprazole (PROTONIX) EC tablet 40 mg, 40 mg, Oral, Q1200, Edrick Oh, MD, 40 mg at 10/02/19 1200 .  phenol (CHLORASEPTIC) mouth spray 1 spray, 1 spray, Mouth/Throat, PRN, Cyndia Skeeters, Taye  T, MD .  pneumococcal 23 valent vaccine (PNU-IMMUNE) injection 0.5 mL, 0.5 mL, Intramuscular, Prior to discharge, Little Ishikawa, MD .  polyethylene glycol (MIRALAX / GLYCOLAX) packet 17 g, 17 g, Per Tube, Daily PRN, Oretha Milch D, MD, 17 g at 09/29/19 1656 .  polyvinyl alcohol (LIQUIFILM TEARS) 1.4 % ophthalmic solution 1 drop, 1 drop, Both Eyes, PRN, Oretha Milch D, MD, 1 drop at 09/14/19 2007 .  ramelteon (ROZEREM) tablet 8 mg, 8 mg, Oral, QHS, Gonfa, Taye T, MD, 8 mg at 10/02/19 2119 .  Resource ThickenUp Clear, , Oral, PRN, Hongalgi, Lenis Dickinson, MD .  Resource ThickenUp Clear, , Oral, PRN, Modena Jansky, MD .  sucralfate (CARAFATE) 1 GM/10ML suspension 1 g, 1 g, Oral, Q6H, Oretha Milch D, MD, 1 g at 10/03/19 0610 .  traZODone (DESYREL) tablet 25 mg, 25 mg, Oral, BID BM & HS PRN, Gonfa, Taye T, MD .  white petrolatum (VASELINE) gel 1 application, 1 application, Topical, PRN, Hongalgi, Lenis Dickinson, MD  Patients Current Diet:  Diet Order            Diet regular Room service appropriate? Yes; Fluid consistency: Thin  Diet effective now              Precautions / Restrictions Precautions Precautions: Fall Precaution Comments: skin breakdown on heels, joints & sacrum Other Brace: bilateral prevalon boots Restrictions Weight Bearing Restrictions: Yes Other Position/Activity Restrictions: elevate LUE when not exercising   Has the patient had 2 or more falls or a fall with injury in the past  year?No  Prior Activity Level Community (5-7x/wk): recently retired, played golf daily, driving, no DME used prior to admission  Prior Functional Level Prior Function Level of Independence: Independent Comments: golfs everyday, retired 2 months   Self Care: Did the patient need help bathing, dressing, using the toilet or eating?  Independent  Indoor Mobility: Did the patient need assistance with walking from room to room (with or without device)? Independent  Stairs: Did the patient need assistance with internal or external stairs (with or without device)? Independent  Functional Cognition: Did the patient need help planning regular tasks such as shopping or remembering to take medications? Independent  Home Assistive Devices / Equipment Home Assistive Devices/Equipment: None Home Equipment: None  Prior Device Use: Indicate devices/aids used by the patient prior to current illness, exacerbation or injury? None of the above  Current Functional Level Cognition  Arousal/Alertness: Awake/alert Overall Cognitive Status: Impaired/Different from baseline Difficult to assess due to: Tracheostomy Current Attention Level: Sustained Orientation Level: Oriented X4 Following Commands: Follows one step commands with increased time Safety/Judgement: Decreased awareness of safety, Decreased awareness of deficits General Comments: pt stating he recently turned 75, he is 28 Attention: Focused Focused Attention: Impaired Focused Attention Impairment: Verbal basic, Functional basic    Extremity Assessment (includes Sensation/Coordination)  Upper Extremity Assessment: RUE deficits/detail, LUE deficits/detail RUE Deficits / Details: AROM WFL strength 3+/5 RUE Sensation: WNL RUE Coordination: decreased fine motor, decreased gross motor LUE Deficits / Details: flaccid, cues to use R UE to protect L UE during transfer LUE Sensation: decreased proprioception LUE Coordination: decreased fine  motor, decreased gross motor  Lower Extremity Assessment: Defer to PT evaluation    ADLs  Overall ADL's : Needs assistance/impaired Eating/Feeding: Set up Eating/Feeding Details (indicate cue type and reason): patient able to hold onto water bottle and take a drink Grooming: Oral care, Cueing for UE precautions, Minimal assistance, Wash/dry face, Sitting Grooming Details (indicate cue  type and reason): in chair Upper Body Bathing: Maximal assistance, Sitting Upper Body Bathing Details (indicate cue type and reason): hand over hand support to guide washing L UE to increase awareness to UE Toileting- Clothing Manipulation and Hygiene: Total assistance, +2 for physical assistance, Bed level Toileting - Clothing Manipulation Details (indicate cue type and reason): RN changed dressing on buttocks wound Functional mobility during ADLs: Total assistance, +2 for physical assistance, Maximal assistance General ADL Comments: Pt is self feeding with set up when in chair.    Mobility  Overal bed mobility: Needs Assistance Bed Mobility: Rolling Rolling: Min assist, Mod assist Sidelying to sit: +2 for physical assistance, +2 for safety/equipment, Max assist Supine to sit: Max assist, +2 for physical assistance, +2 for safety/equipment Sit to supine: Total assist, +2 for physical assistance, +2 for safety/equipment Sit to sidelying: Max assist, +2 for physical assistance, +2 for safety/equipment General bed mobility comments: max cuing for hemi technique & attend to LUE when rolling R    Transfers  Overall transfer level: Needs assistance Transfer via Lift Equipment: [place] transfer comment: Maximove utilized for transition OOB to chair.     Ambulation / Gait / Stairs / Office manager / Balance Dynamic Sitting Balance Sitting balance - Comments: Pt initially needing mod A progressing to min guard Balance Overall balance assessment: Needs assistance Sitting-balance  support: Feet supported, Single extremity supported Sitting balance-Leahy Scale: Poor Sitting balance - Comments: Pt initially needing mod A progressing to min guard Postural control: Left lateral lean Standing balance support: Single extremity supported Standing balance-Leahy Scale: Zero Standing balance comment: unable to attempt standing this session    Special needs/care consideration BiPAP/CPAP no CPM no Continuous Drip IV no Dialysis yes        Days TRS Life Vest no Oxygen 1L via nasal cannula Special Bed vital golift  Trach Size decannualted 11/30 Wound Vac (area) no      Location n/a Skin Stage 2 pressure injury to sacrum, sores to distal shaft of penis, multiple skin tears, and skin breakdown areas from prolonged hospitalization Bowel mgmt: incontinent  Bladder mgmt: foley, making small amounts of urine  Diabetic mgmtno  Behavioral consideration  Chemo/radiation      Previous Home Environment (from acute therapy documentation) Living Arrangements: Spouse/significant other  Lives With: Spouse Available Help at Discharge: Family Type of Home: House Home Layout: One level Home Access: Stairs to enter Entrance Stairs-Rails: Right, Left Entrance Stairs-Number of Steps: 3 Bathroom Shower/Tub: Chiropodist: Handicapped height Bathroom Accessibility: Yes Home Care Services: No  Discharge Living Setting Plans for Discharge Living Setting: Patient's home Type of Home at Discharge: House Discharge Home Layout: One level Discharge Home Access: Stairs to enter Entrance Stairs-Rails: Left, Right Entrance Stairs-Number of Steps: 3 Discharge Bathroom Shower/Tub: Tub/shower unit Discharge Bathroom Toilet: Handicapped height Discharge Bathroom Accessibility: Yes How Accessible: Accessible via walker Does the patient have any problems obtaining your medications?: No  Social/Family/Support Systems Patient Roles: Spouse Anticipated Caregiver: wife, Greogry Goodwyn, and other family members Anticipated Caregiver's Contact Information: [name] [phone] (c), [phone] (h) Ability/Limitations of Caregiver: min assist Caregiver Availability: 24/7 Discharge Plan Discussed with Primary Caregiver: Yes Is Caregiver In Agreement with Plan?: Yes Does Caregiver/Family have Issues with Lodging/Transportation while Pt is in Rehab?: No   Goals/Additional Needs Patient/Family Goal for Rehab: PT/OT min assist, SLP mod I Expected length of stay: 21-28 days Dietary Needs: reg/thin Additional Information: Pt with excellent family support  and potential to make large gains with CIR level therapy.  Pt/Family Agrees to Admission and willing to participate: Yes Program Orientation Provided & Reviewed with Pt/Caregiver Including Roles  & Responsibilities: Yes  Barriers to Discharge: Hemodialysis   Decrease burden of Care through IP rehab admission: n/a   Possible need for SNF placement upon discharge: Not anticipated.    Patient Condition: This patient's medical and functional status has changed since the consult dated: 09/15/2019 in which the Rehabilitation Physician determined and documented that the patient's condition is appropriate for intensive rehabilitative care in an inpatient rehabilitation facility. See "History of Present Illness" (above) for medical update. Functional changes are: pt currently min/mod assist to EOB, tolerating sitting in chair for several hours at a time. Patient's medical and functional status update has been discussed with the Rehabilitation physician and patient remains appropriate for inpatient rehabilitation. Will admit to inpatient rehab today.  Preadmission Screen Completed By:  Michel Santee, PT, DPT 10/03/2019 10:36 AM ______________________________________________________________________   Discussed status with Dr. Adam [name] on 10/03/19 at 10:36 AM  and received approval for admission today.  Admission Coordinator:   Michel Santee, PT, DPT time 10:36 AM Sudie Grumbling 10/03/19

## 2019-10-02 NOTE — Plan of Care (Signed)
  Problem: Respiratory: Goal: Will maintain a patent airway Outcome: Progressing   

## 2019-10-02 NOTE — Progress Notes (Signed)
Patient has been accepted for OP HD treatment at [facility] on a TTS schedule with a seat time of 12:30pm. This schedule is subject to change depending on how long patient is in CIR. Renal Navigator notified CIR Admissions Coordinator that patient has OP HD seat.   Alphonzo Cruise, [place] Renal Navigator [phone]

## 2019-10-02 NOTE — Progress Notes (Signed)
[facility] Progress Note   **Nephrology now acting as primary team for this patient due to COVID pandemic**  Subjective:  Seen on HD - 1L net UF and tolerating. No CP/dyspnea. Has ~360mL UOP in foley bag. Tentatively accepted at outpt HD unit (requirement for CIR admit) - hopefully will have final paperwork today and then can proceed with insurance authorization for CIR.  Objective Vitals:   10/02/19 0716 10/02/19 0730 10/02/19 0800 10/02/19 0830  BP: (!) 149/88 119/77 112/86 137/79  Pulse: 92 (!) 108 97 96  Resp:      Temp:      TempSrc:      SpO2:      Weight:      Height:       Physical Exam General:Frail appearing man, NAD Heart:RRR; no murmur Lungs:CTA anteriorly Abdomen:soft, non-tender. Foley in place, + urine present in bag Extremities:No LE edema; in soft heel protectors Dialysis Access:L TDC without tenderness  Additional Objective Labs: Basic Metabolic Panel: Recent Labs  Lab 09/27/19 0524 09/30/19 0800 10/02/19 0800  NA 132* 133* 133*  K 3.9 3.9 3.9  CL 94* 95* 95*  CO2 27 26 28   GLUCOSE 84 93 104*  BUN 25* 36* 33*  CREATININE 4.83* 6.03* 5.04*  CALCIUM 9.0 9.4 9.3  PHOS 3.8 4.2 3.0   Liver Function Tests: Recent Labs  Lab 09/27/19 0524 09/30/19 0800 10/02/19 0800  ALBUMIN 1.8* 2.2* 2.0*   CBC: Recent Labs  Lab 09/25/19 1115 09/27/19 0524 09/29/19 0545 10/01/19 0511 10/02/19 0802  WBC 14.7* 13.5* 13.8* 13.6* 13.4*  HGB 8.2* 7.9* 7.8* 8.3* 7.7*  HCT 25.9* 25.9* 24.7* 26.9* 25.0*  MCV 90.6 90.9 90.5 91.8 92.6  PLT 366 391 408* 350 362   CBG: Recent Labs  Lab 09/30/19 2052 10/01/19 0728 10/01/19 1602 10/01/19 2132 10/02/19 0635  GLUCAP 126* 86 91 91 130*   Medications: . sodium chloride Stopped (09/21/19 1659)   . aspirin EC  81 mg Oral Daily  . atorvastatin  40 mg Oral q1800  . budesonide (PULMICORT) nebulizer solution  0.5 mg Nebulization BID  . chlorhexidine  15 mL Mouth/Throat BID  .  Chlorhexidine Gluconate Cloth  6 each Topical Q0600  . [START ON 10/04/2019] darbepoetin (ARANESP) injection - DIALYSIS  150 mcg Intravenous Q Sat-HD  . feeding supplement (NEPRO CARB STEADY)  237 mL Oral TID BM  . feeding supplement (PRO-STAT SUGAR FREE 64)  30 mL Oral BID  . heparin injection (subcutaneous)  5,000 Units Subcutaneous Q8H  . multivitamin  1 tablet Oral QHS  . pantoprazole  40 mg Oral Q1200  . ramelteon  8 mg Oral QHS  . sucralfate  1 g Oral Q6H    Dialysis Orders: New start ESRD-> 4hr, 3K, TDC, 400/800, EDW pending.  Assessment/Plan: 1.S/p acute hypoxemia and respiratory failure due to COVID 52:[id number] complications, prolonged ICU course. R chest tubeout,trach removed 11/30. Steady improvement. 2. ESRD:New start this admission, on TTSschedule - HD 12/10.  Admitted 10/3 and started CRRT on 08/07/19, has been on RRT for a little under 2 mos now. Patient prefers to avoid perm access for now hoping that renal function will recover.CLIP pending. 3.HTN/volume:BPvariable - follow with HD. 1L UF today. 4. Anemia:Hgb7.7.HxGI bleed this admission. No heparin with HD.S/p IV iron. Continue Aranesp 175mcg q Sat. 5. Secondary hyperparathyroidism:CorrCa slightly high. Not on calcium or vit D, use low Ca bath. Phos nowatgoal.Check iPTH with next HD. 6.CVA:Bilatcerebral cortex and right cerebellum small acute to subacuteinfarctswithneuromuscular weakness/encephalopathy. 7.  Nutrition: Alb very low - per nursing note, pt reports lack on taste (post-COVID). Addednepro + pro-stat supplements. 8.Sacral decub: Pressure ulcer - wound care following. 9.Dispo: [facility] out. Still very debilitated, will need ongoing PT/OT- possibly to CIR.Needs HD in chair.  Veneta Penton, PA-C 10/02/2019, 9:17 AM  [employer] Pager: [phone]

## 2019-10-03 ENCOUNTER — Inpatient Hospital Stay (HOSPITAL_COMMUNITY)
Admission: RE | Admit: 2019-10-03 | Discharge: 2019-11-07 | DRG: 939 | Disposition: A | Discharge status: Home Health | Payer: Medicare HMO | Source: Intra-hospital | Attending: Physical Medicine & Rehabilitation | Admitting: Physical Medicine & Rehabilitation

## 2019-10-03 ENCOUNTER — Other Ambulatory Visit: Payer: Self-pay

## 2019-10-03 ENCOUNTER — Encounter (HOSPITAL_COMMUNITY): Payer: Self-pay | Admitting: Physical Medicine and Rehabilitation

## 2019-10-03 DIAGNOSIS — E876 Hypokalemia: Secondary | ICD-10-CM | POA: Diagnosis not present

## 2019-10-03 DIAGNOSIS — N186 End stage renal disease: Secondary | ICD-10-CM | POA: Diagnosis present

## 2019-10-03 DIAGNOSIS — J80 Acute respiratory distress syndrome: Secondary | ICD-10-CM | POA: Diagnosis not present

## 2019-10-03 DIAGNOSIS — I120 Hypertensive chronic kidney disease with stage 5 chronic kidney disease or end stage renal disease: Secondary | ICD-10-CM | POA: Diagnosis present

## 2019-10-03 DIAGNOSIS — E785 Hyperlipidemia, unspecified: Secondary | ICD-10-CM | POA: Diagnosis present

## 2019-10-03 DIAGNOSIS — R5381 Other malaise: Principal | ICD-10-CM | POA: Diagnosis present

## 2019-10-03 DIAGNOSIS — I69354 Hemiplegia and hemiparesis following cerebral infarction affecting left non-dominant side: Secondary | ICD-10-CM

## 2019-10-03 DIAGNOSIS — R1312 Dysphagia, oropharyngeal phase: Secondary | ICD-10-CM | POA: Diagnosis not present

## 2019-10-03 DIAGNOSIS — L89150 Pressure ulcer of sacral region, unstageable: Secondary | ICD-10-CM

## 2019-10-03 DIAGNOSIS — D649 Anemia, unspecified: Secondary | ICD-10-CM | POA: Diagnosis present

## 2019-10-03 DIAGNOSIS — R609 Edema, unspecified: Secondary | ICD-10-CM

## 2019-10-03 DIAGNOSIS — I959 Hypotension, unspecified: Secondary | ICD-10-CM | POA: Diagnosis not present

## 2019-10-03 DIAGNOSIS — Z992 Dependence on renal dialysis: Secondary | ICD-10-CM | POA: Diagnosis not present

## 2019-10-03 DIAGNOSIS — K5900 Constipation, unspecified: Secondary | ICD-10-CM | POA: Diagnosis present

## 2019-10-03 DIAGNOSIS — D638 Anemia in other chronic diseases classified elsewhere: Secondary | ICD-10-CM | POA: Diagnosis not present

## 2019-10-03 DIAGNOSIS — R579 Shock, unspecified: Secondary | ICD-10-CM | POA: Diagnosis not present

## 2019-10-03 DIAGNOSIS — B370 Candidal stomatitis: Secondary | ICD-10-CM | POA: Diagnosis not present

## 2019-10-03 DIAGNOSIS — N2581 Secondary hyperparathyroidism of renal origin: Secondary | ICD-10-CM | POA: Diagnosis present

## 2019-10-03 DIAGNOSIS — I63039 Cerebral infarction due to thrombosis of unspecified carotid artery: Secondary | ICD-10-CM | POA: Diagnosis not present

## 2019-10-03 DIAGNOSIS — I69391 Dysphagia following cerebral infarction: Secondary | ICD-10-CM | POA: Diagnosis not present

## 2019-10-03 DIAGNOSIS — Z79899 Other long term (current) drug therapy: Secondary | ICD-10-CM

## 2019-10-03 DIAGNOSIS — I4891 Unspecified atrial fibrillation: Secondary | ICD-10-CM | POA: Diagnosis present

## 2019-10-03 DIAGNOSIS — N179 Acute kidney failure, unspecified: Secondary | ICD-10-CM | POA: Diagnosis not present

## 2019-10-03 DIAGNOSIS — K921 Melena: Secondary | ICD-10-CM | POA: Diagnosis present

## 2019-10-03 DIAGNOSIS — J9600 Acute respiratory failure, unspecified whether with hypoxia or hypercapnia: Secondary | ICD-10-CM | POA: Diagnosis present

## 2019-10-03 DIAGNOSIS — I639 Cerebral infarction, unspecified: Secondary | ICD-10-CM

## 2019-10-03 DIAGNOSIS — M7989 Other specified soft tissue disorders: Secondary | ICD-10-CM | POA: Diagnosis not present

## 2019-10-03 DIAGNOSIS — L89159 Pressure ulcer of sacral region, unspecified stage: Secondary | ICD-10-CM | POA: Diagnosis present

## 2019-10-03 DIAGNOSIS — Z66 Do not resuscitate: Secondary | ICD-10-CM | POA: Diagnosis present

## 2019-10-03 DIAGNOSIS — D631 Anemia in chronic kidney disease: Secondary | ICD-10-CM | POA: Diagnosis not present

## 2019-10-03 DIAGNOSIS — Z7982 Long term (current) use of aspirin: Secondary | ICD-10-CM | POA: Diagnosis not present

## 2019-10-03 DIAGNOSIS — L89153 Pressure ulcer of sacral region, stage 3: Secondary | ICD-10-CM | POA: Diagnosis not present

## 2019-10-03 DIAGNOSIS — G479 Sleep disorder, unspecified: Secondary | ICD-10-CM

## 2019-10-03 DIAGNOSIS — G9349 Other encephalopathy: Secondary | ICD-10-CM | POA: Diagnosis present

## 2019-10-03 DIAGNOSIS — J1289 Other viral pneumonia: Secondary | ICD-10-CM | POA: Diagnosis not present

## 2019-10-03 DIAGNOSIS — I6300 Cerebral infarction due to thrombosis of unspecified precerebral artery: Secondary | ICD-10-CM

## 2019-10-03 DIAGNOSIS — Z4901 Encounter for fitting and adjustment of extracorporeal dialysis catheter: Secondary | ICD-10-CM | POA: Diagnosis not present

## 2019-10-03 DIAGNOSIS — M6281 Muscle weakness (generalized): Secondary | ICD-10-CM | POA: Diagnosis not present

## 2019-10-03 DIAGNOSIS — A4901 Methicillin susceptible Staphylococcus aureus infection, unspecified site: Secondary | ICD-10-CM | POA: Diagnosis not present

## 2019-10-03 DIAGNOSIS — U071 COVID-19: Secondary | ICD-10-CM | POA: Diagnosis present

## 2019-10-03 DIAGNOSIS — E872 Acidosis: Secondary | ICD-10-CM | POA: Diagnosis not present

## 2019-10-03 DIAGNOSIS — B948 Sequelae of other specified infectious and parasitic diseases: Secondary | ICD-10-CM

## 2019-10-03 DIAGNOSIS — J1282 Pneumonia due to coronavirus disease 2019: Secondary | ICD-10-CM | POA: Diagnosis present

## 2019-10-03 DIAGNOSIS — E611 Iron deficiency: Secondary | ICD-10-CM | POA: Diagnosis present

## 2019-10-03 LAB — GLUCOSE, CAPILLARY
Glucose-Capillary: 79 mg/dL (ref 70–99)
Glucose-Capillary: 95 mg/dL (ref 70–99)

## 2019-10-03 LAB — CBC
HCT: 27.5 % — ABNORMAL LOW (ref 39.0–52.0)
Hemoglobin: 8.4 g/dL — ABNORMAL LOW (ref 13.0–17.0)
MCH: 28.3 pg (ref 26.0–34.0)
MCHC: 30.5 g/dL (ref 30.0–36.0)
MCV: 92.6 fL (ref 80.0–100.0)
Platelets: 281 10*3/uL (ref 150–400)
RBC: 2.97 MIL/uL — ABNORMAL LOW (ref 4.22–5.81)
RDW: 15.6 % — ABNORMAL HIGH (ref 11.5–15.5)
WBC: 11.3 10*3/uL — ABNORMAL HIGH (ref 4.0–10.5)
nRBC: 0.2 % (ref 0.0–0.2)

## 2019-10-03 MED ORDER — POLYVINYL ALCOHOL 1.4 % OP SOLN
1.0000 [drp] | OPHTHALMIC | Status: DC | PRN
  Administered 2019-10-05: 1 [drp] via OPHTHALMIC
  Filled 2019-10-03: qty 15

## 2019-10-03 MED ORDER — CHLORHEXIDINE GLUCONATE 0.12 % MT SOLN
15.0000 mL | Freq: Two times a day (BID) | OROMUCOSAL | Status: DC
  Administered 2019-10-03 – 2019-11-05 (×41): 15 mL via OROMUCOSAL
  Filled 2019-10-03 (×52): qty 15

## 2019-10-03 MED ORDER — HEPARIN SODIUM (PORCINE) 5000 UNIT/ML IJ SOLN: 5000.0000 [IU] | Freq: Three times a day (TID) | INTRAMUSCULAR | Status: DC

## 2019-10-03 MED ORDER — HYDROCODONE-ACETAMINOPHEN 7.5-325 MG/15ML PO SOLN
10.0000 mL | ORAL | Status: DC | PRN
  Administered 2019-10-03 – 2019-10-13 (×5): 10 mL via ORAL
  Filled 2019-10-03 (×6): qty 15

## 2019-10-03 MED ORDER — RENA-VITE PO TABS
1.0000 | ORAL_TABLET | Freq: Every day | ORAL | Status: DC
  Administered 2019-10-03 – 2019-10-29 (×26): 1 via ORAL
  Filled 2019-10-03 (×30): qty 1

## 2019-10-03 MED ORDER — SUCRALFATE 1 GM/10ML PO SUSP: 1.0000 g | Freq: Four times a day (QID) | ORAL | 0 refills | Status: DC

## 2019-10-03 MED ORDER — NEPRO/CARBSTEADY PO LIQD: 237.0000 mL | Freq: Three times a day (TID) | ORAL | 0 refills | Status: DC

## 2019-10-03 MED ORDER — CHLORHEXIDINE GLUCONATE CLOTH 2 % EX PADS: 6.0000 | MEDICATED_PAD | Freq: Every day | CUTANEOUS | Status: DC

## 2019-10-03 MED ORDER — HEPARIN SODIUM (PORCINE) 5000 UNIT/ML IJ SOLN
5000.0000 [IU] | Freq: Three times a day (TID) | INTRAMUSCULAR | Status: DC
  Administered 2019-10-03 – 2019-11-06 (×93): 5000 [IU] via SUBCUTANEOUS
  Filled 2019-10-03 (×96): qty 1

## 2019-10-03 MED ORDER — RAMELTEON 8 MG PO TABS: 8.0000 mg | ORAL_TABLET | Freq: Every day | ORAL | Status: DC

## 2019-10-03 MED ORDER — NEPRO/CARBSTEADY PO LIQD
237.0000 mL | Freq: Three times a day (TID) | ORAL | Status: DC
  Administered 2019-10-03 – 2019-10-20 (×25): 237 mL via ORAL

## 2019-10-03 MED ORDER — RAMELTEON 8 MG PO TABS
8.0000 mg | ORAL_TABLET | Freq: Every day | ORAL | Status: DC
  Administered 2019-10-03 – 2019-11-05 (×29): 8 mg via ORAL
  Filled 2019-10-03 (×36): qty 1

## 2019-10-03 MED ORDER — ACETAMINOPHEN 160 MG/5ML PO SOLN
650.0000 mg | ORAL | Status: DC | PRN
  Administered 2019-10-03 – 2019-11-01 (×2): 650 mg via ORAL
  Filled 2019-10-03 (×3): qty 20.3

## 2019-10-03 MED ORDER — DARBEPOETIN ALFA 150 MCG/0.3ML IJ SOSY
150.0000 ug | PREFILLED_SYRINGE | INTRAMUSCULAR | Status: DC
  Administered 2019-10-11: 150 ug via INTRAVENOUS
  Filled 2019-10-03 (×4): qty 0.3

## 2019-10-03 MED ORDER — PANTOPRAZOLE SODIUM 40 MG PO TBEC
40.0000 mg | DELAYED_RELEASE_TABLET | Freq: Every day | ORAL | Status: DC
  Administered 2019-10-04 – 2019-11-06 (×31): 40 mg via ORAL
  Filled 2019-10-03 (×32): qty 1

## 2019-10-03 MED ORDER — BUDESONIDE 0.5 MG/2ML IN SUSP
0.5000 mg | Freq: Two times a day (BID) | RESPIRATORY_TRACT | Status: DC
  Administered 2019-10-03 – 2019-11-07 (×59): 0.5 mg via RESPIRATORY_TRACT
  Filled 2019-10-03 (×69): qty 2

## 2019-10-03 MED ORDER — PHENOL 1.4 % MT LIQD: 1.0000 | OROMUCOSAL | 0 refills | Status: DC | PRN

## 2019-10-03 MED ORDER — SUCRALFATE 1 GM/10ML PO SUSP
1.0000 g | Freq: Four times a day (QID) | ORAL | Status: DC
  Administered 2019-10-03 – 2019-11-06 (×113): 1 g via ORAL
  Filled 2019-10-03 (×141): qty 10

## 2019-10-03 MED ORDER — IPRATROPIUM-ALBUTEROL 0.5-2.5 (3) MG/3ML IN SOLN: 3.0000 mL | RESPIRATORY_TRACT | Status: DC | PRN

## 2019-10-03 MED ORDER — POLYETHYLENE GLYCOL 3350 17 G PO PACK: 17.0000 g | PACK | Freq: Every day | ORAL | Status: DC | PRN

## 2019-10-03 MED ORDER — PANTOPRAZOLE SODIUM 40 MG PO TBEC: 40.0000 mg | DELAYED_RELEASE_TABLET | Freq: Every day | ORAL | Status: DC

## 2019-10-03 MED ORDER — ASPIRIN 81 MG PO TBEC: 81.0000 mg | DELAYED_RELEASE_TABLET | Freq: Every day | ORAL | Status: DC

## 2019-10-03 MED ORDER — ONDANSETRON HCL 4 MG/2ML IJ SOLN: 4.0000 mg | Freq: Four times a day (QID) | INTRAMUSCULAR | 0 refills | Status: DC | PRN

## 2019-10-03 MED ORDER — ASPIRIN EC 81 MG PO TBEC
81.0000 mg | DELAYED_RELEASE_TABLET | Freq: Every day | ORAL | Status: DC
  Administered 2019-10-04 – 2019-11-06 (×33): 81 mg via ORAL
  Filled 2019-10-03 (×35): qty 1

## 2019-10-03 MED ORDER — ATORVASTATIN CALCIUM 40 MG PO TABS: 40.0000 mg | ORAL_TABLET | Freq: Every day | ORAL | Status: DC

## 2019-10-03 MED ORDER — BUDESONIDE 0.5 MG/2ML IN SUSP: 0.5000 mg | Freq: Two times a day (BID) | RESPIRATORY_TRACT | 12 refills | Status: DC

## 2019-10-03 MED ORDER — WHITE PETROLATUM EX OINT: 1.0000 "application " | TOPICAL_OINTMENT | CUTANEOUS | Status: DC | PRN

## 2019-10-03 MED ORDER — ACETAMINOPHEN 160 MG/5ML PO SOLN: 650.0000 mg | ORAL | 0 refills | Status: DC | PRN

## 2019-10-03 MED ORDER — DARBEPOETIN ALFA 150 MCG/0.3ML IJ SOSY: 150.0000 ug | PREFILLED_SYRINGE | INTRAMUSCULAR | Status: DC

## 2019-10-03 MED ORDER — SORBITOL 70 % SOLN
30.0000 mL | Freq: Every day | Status: DC | PRN
  Administered 2019-10-16: 30 mL via ORAL
  Filled 2019-10-03 (×2): qty 30

## 2019-10-03 MED ORDER — GUAIFENESIN-DM 100-10 MG/5ML PO SYRP: 5.0000 mL | ORAL_SOLUTION | ORAL | 0 refills | Status: DC | PRN

## 2019-10-03 MED ORDER — IPRATROPIUM-ALBUTEROL 0.5-2.5 (3) MG/3ML IN SOLN
3.0000 mL | RESPIRATORY_TRACT | Status: DC | PRN
  Administered 2019-10-17: 3 mL via RESPIRATORY_TRACT
  Filled 2019-10-03: qty 3

## 2019-10-03 MED ORDER — RESOURCE THICKENUP CLEAR PO POWD: 1.0000 | ORAL | Status: DC | PRN

## 2019-10-03 MED ORDER — PRO-STAT SUGAR FREE PO LIQD: 30.0000 mL | Freq: Two times a day (BID) | ORAL | 0 refills | Status: DC

## 2019-10-03 MED ORDER — PRO-STAT SUGAR FREE PO LIQD
30.0000 mL | Freq: Two times a day (BID) | ORAL | Status: DC
  Administered 2019-10-03 – 2019-11-06 (×61): 30 mL via ORAL
  Filled 2019-10-03 (×67): qty 30

## 2019-10-03 MED ORDER — TRAZODONE HCL 50 MG PO TABS: 25.0000 mg | ORAL_TABLET | Freq: Two times a day (BID) | ORAL | Status: DC | PRN

## 2019-10-03 MED ORDER — ATORVASTATIN CALCIUM 40 MG PO TABS
40.0000 mg | ORAL_TABLET | Freq: Every day | ORAL | Status: DC
  Administered 2019-10-03 – 2019-11-06 (×35): 40 mg via ORAL
  Filled 2019-10-03 (×35): qty 1

## 2019-10-03 MED ORDER — CHLORHEXIDINE GLUCONATE 0.12 % MT SOLN: 15.0000 mL | Freq: Two times a day (BID) | OROMUCOSAL | 0 refills | Status: DC

## 2019-10-03 MED ORDER — RENA-VITE PO TABS: 1.0000 | ORAL_TABLET | Freq: Every day | ORAL | 0 refills | Status: DC

## 2019-10-03 MED ORDER — POLYVINYL ALCOHOL 1.4 % OP SOLN: 1.0000 [drp] | OPHTHALMIC | 0 refills | Status: DC | PRN

## 2019-10-03 MED ORDER — POLYETHYLENE GLYCOL 3350 17 G PO PACK: 17.0000 g | PACK | Freq: Every day | ORAL | 0 refills | Status: DC | PRN

## 2019-10-03 MED ORDER — WHITE PETROLATUM EX OINT: 1.0000 "application " | TOPICAL_OINTMENT | CUTANEOUS | 0 refills | Status: DC | PRN

## 2019-10-03 NOTE — Progress Notes (Addendum)
Admission note:  Patient was admitted to 4W03 via bed (air mattress), escorted by nursing staff.  Patient verbalized understanding of rehab process including fall prevention policy, personal belongings policy, and visitation policy.  Patient appears to be in no immediate distress at this time.  Patient has the following wounds:  Unstageable to coccyx (unable to visualize wound bed due to depth and tunneling).  Area is pink with pale yellow around edges.  Tunneling 6cm towards anus and undermines approximately 3 cm all around other edges.  Some purulent drainage noted on q-tip.  Patient denies pain during this assessment.  Old trach stoma: completely closed with small crusty area superficially.  Flaky heels bilaterally, 1 random suture noted to right neck. Incision to left neck closed with skin glue Incision for HD IJ catheter to left chest.  Notified Marlowe Shores, PA of findings.  Ordered to contact [facility] to follow on our unit. Assisted patient to lay on left side with pillows between knees and ankles, off his bottom as much as possible.  Brita Romp, RN

## 2019-10-03 NOTE — Progress Notes (Signed)
Inpatient Rehab Admissions Coordinator:   I have insurance authorization and approval from [facility], renal PA, for pt to admit today.  I will let pt/family and CM know.   Shann Medal, PT, DPT Admissions Coordinator [phone] 10/03/19  10:29 AM

## 2019-10-03 NOTE — Consult Note (Signed)
   [facility] CM Inpatient Consult   10/03/2019  [name] 01/24/1951 [id number]   Follow up: [facility] Medicare disposition  Patient has authorization per Inpatient Rehabilitation Coordinator's note for [facility] to [facility].  Plan: Patient will remain on [facility] Liaison list and for follow up on progress and transition of care needs for post CIR follow up needs.  For questions, contact:  Natividad Brood, RN BSN [facility] Liaison  [phone] business mobile phone Toll free office [phone]  Fax number: [phone] [email or web address]

## 2019-10-03 NOTE — Progress Notes (Signed)
  Speech Language Pathology Treatment: Cognitive-Linquistic  Patient Details Name: [name] MRN: [id number] DOB: [birth year] Today's Date: 10/03/2019 Time: 9977-4142 SLP Time Calculation (min) (ACUTE ONLY): 20 min  Assessment / Plan / Recommendation Clinical Impression  Pt was seen for skilled ST targeting cognitive goals.  Pt was initially resting upon therapist's arrival and politely requested to defer treatment until later but then changed his mind and stated that he would be willing to work with therapist.  Pt was repositioned in bed to maximize comfort, attention, and alertness during treatment session.  SLP facilitated the session with a novel card game to address goals for attention.  Pt was able to alternate his attention between brief moments of conversation with therapist and game for task's duration with mod I.  Pt was also able to problem solve throughout task at a basic level with supervision cues.  Per chart review, pt is scheduled to transfer to CIR possibly as early as today where I would recommend additional diagnostics of higher level cognitive skills.  Pt was left in bed with call bell within reach.  Continue per current plan of care.    HPI HPI: 68 year old with a history of GERD and BPH who presented to [facility] ED with S OB and was found to be hypoxic.  He was initially diagnosed with COVID-19 September 20.  1 to 2 days prior to his presentation he developed worsening shortness of breath with. Admitted on 10/3, intubated.  EGD on 10/17 showed normal esophagus but excessive gastric fluid. CT scan of the chest on 10/21 showed findings consistent with an empyema as well as persistent pneumothorax.  Bedside bronchoscopy revealed material in the airway. Trached on 10/30. On ATC by 10/31.Additionally MRI on 11/4 showed There is cortical restricted diffusion posteriorly in the right frontal lobe consistent with acute infarct. Additional smaller regions of diffusion abnormality are  present in the posterior left frontal lobe, anterior left frontal lobe, and right greater than left medial parietal lobes as well as right cerebellum. Pt now decannulated on 11/30.       SLP Plan  Continue with current plan of care       Recommendations                   Follow up Recommendations: Inpatient Rehab SLP Visit Diagnosis: Cognitive communication deficit (L95.320) Plan: Continue with current plan of care       GO                [name], Selinda Orion 10/03/2019, 10:38 AM

## 2019-10-03 NOTE — Progress Notes (Signed)
Pt was transferred to 4W03. Belongings with pt, wife notified.    Paulla Fore, RN, BSN

## 2019-10-03 NOTE — Discharge Summary (Signed)
Physician Discharge Summary  Patient ID: [name] MRN: [id number] DOB/AGE: 02/11/1951 68 y.o.  Admit date: 07/25/2019 Discharge date: 10/03/2019  Admission Diagnoses:   Pneumonia due to COVID-19 virus  Discharge Diagnoses:  Principal Problem:   Pneumonia due to COVID-19 virus Active Problems:   BPH (benign prostatic hyperplasia)   Acute respiratory failure due to COVID-19 ([name])   AKI (acute kidney injury) (HCC)   Fluid overload   Pneumothorax on right   Acute renal failure (ARF) (HCC)   GI bleed   Atrial fibrillation with RVR (HCC)   Constipation   Gastric ulcer with hemorrhage   Acute respiratory failure (HCC)   Chest tube in place   Primary spontaneous pneumothorax   Acute respiratory distress syndrome (ARDS) due to COVID-19 virus ([name])   Tracheostomy status ([name])   Cerebral thrombosis with cerebral infarction   Advanced care planning/counseling discussion   Goals of care, counseling/discussion   Palliative care by specialist   Sinus tachycardia   Acute blood loss anemia   H. pylori infection   CVA (cerebral vascular accident) ([name])   Anoxic brain injury ([name])   MSSA bacteremia   Positive D dimer   Pseudomonas infection   Infection due to acinetobacter baumannii   Discharged Condition: stable  Hospital Course:   [name] is a 68 year old man who had been diagnosed with COVID-19 on 07/13/2019. Presented to [facility] ED 07/25/19 then admitted to [facility] on 07/26/2019 with acute dyspnea. Chest CT showed diffuse multifocal pneumonia, negative for pulmonary embolus. Hospitalization with multiple complications, as below. He improved over time and was deemed ready to transfer to inpatient rehab on 10/03/2019.  1.COVID-19 Pneumonia:  - S/p IV Remdesivir, Tociluzimab, IV Decadron, 1U convalescent plasma  - Vancomycin and Cefepime started 08/01/19  2. Acute hypoxic respiratory failure due to COVID 19:   - Emergently intubated 07/25/2019  - Pneumothorax  08/05/2019 due to bronchopleural fistula -> R chest tube placed.  - Bronch washing grew Pseudomonas, acinetobacter -> completed abx course.  - Bronchoscopy for endobronchial blocker on 08/14/19  - R chest tuberemoved 11/16,trach removed 11/30. Steady improvement.  3. AKI -> likely ESRD at this time.  - Temporary HD line 10/14 by CCM  - CRRT started 08/07/2019 due to AKI.  - Converted to intermittent HD on 08/26/19  - No significant renal recovery thus far  - CLIPPED to [facility] (TTS 12:30p)  - Vein mapping completed 11/19 for potential AVF by VVS  4. Ileus 08/02/19  5. MSSA bacteremia  - Blood Cx + 08/01/2019, initially Vanc/Cefepime - changed to multiple antibiotics over 52d period, including  antifungal coverage. Stopped by ID on 11/30.  6. Melena, noted 10/11  - Transfused 10U PRBCs this admit, last 11/30. 1U platelets 08/10/19, 1U FFP 07/27/19  7. Hypertension/fluid overload  - Improved with ongoing dialysis.  8. A-Fib RVR, noted 10/17  - Amiodarone initiated.  9. Acute CVA with encephalopathy  - MRI 08/26/19 showed small acute/subacute infarcts involving bilateral cerebral cortex and right cerebellum.  10. Anemia of chronic disease  - S/pcourse of IV iron. Continued Aranesp131mcg q Sat.  11. Secondary hyperparathyroidism  - CorrCa slightly high. Not on calcium or vit D, using low Ca dialysate bath  12. Malnutrition:  - Alb very low with lack on taste (post-COVID). Continued nepro + pro-stat supplements.  13. Sacral decubitus ulcer  - Wound care following.  14. Severe debility  - Transferring to CIR for rehab  Consults:  Nephrology Infectious Disease  GI [facility] Cardiothoracic Surgery Neurology IR Palliative Care Rehab  Discharge Exam: Blood pressure 134/78, pulse 90, temperature 98.7 F (37.1 C), temperature source Oral, resp. rate 18, height 5\' 10"  (1.778 m), weight 74.1 kg, SpO2 100 %.   General:Frail appearing man, NAD Neck: Trach  decannulation - bandage remains in place/dry. Heart:RRR; no murmur Lungs:CTA anteriorly Abdomen:soft, non-tender. Foley in place, small amount urine present in bag Extremities:No LE edema; in soft heel protectors Dialysis Access:L TDC without tenderness  Disposition: Discharge disposition: [place] Not Defined     [facility] inpatient rehab (CIR)   Allergies as of 10/03/2019   No Known Allergies     Medication List    STOP taking these medications   ondansetron 4 MG disintegrating tablet Commonly known as: Zofran ODT     TAKE these medications   acetaminophen 160 MG/5ML solution Commonly known as: TYLENOL Place 20.3 mLs (650 mg total) into feeding tube every 4 (four) hours as needed for fever.   aspirin 81 MG EC tablet Take 1 tablet (81 mg total) by mouth daily.   atorvastatin 40 MG tablet Commonly known as: LIPITOR Take 1 tablet (40 mg total) by mouth daily at 6 PM.   budesonide 0.5 MG/2ML nebulizer solution Commonly known as: PULMICORT Take 2 mLs (0.5 mg total) by nebulization 2 (two) times daily.   chlorhexidine 0.12 % solution Commonly known as: PERIDEX Use as directed 15 mLs in the mouth or throat 2 (two) times daily.   Chlorhexidine Gluconate Cloth 2 % Pads Apply 6 each topically daily at 6 (six) AM.   Darbepoetin Alfa 150 MCG/0.3ML Sosy injection Commonly known as: ARANESP Inject 0.3 mLs (150 mcg total) into the vein every Saturday with hemodialysis. Start taking on: October 04, 2019   feeding supplement (NEPRO CARB STEADY) Liqd Take 237 mLs by mouth 3 (three) times daily between meals.   feeding supplement (PRO-STAT SUGAR FREE 64) Liqd Take 30 mLs by mouth 2 (two) times daily.   guaiFENesin-dextromethorphan 100-10 MG/5ML syrup Commonly known as: ROBITUSSIN DM Place 5 mLs into feeding tube every 4 (four) hours as needed for cough.   heparin 5000 UNIT/ML injection Inject 1 mL (5,000 Units total) into the skin every 8  (eight) hours.   ipratropium-albuterol 0.5-2.5 (3) MG/3ML Soln Commonly known as: DUONEB Take 3 mLs by nebulization every 4 (four) hours as needed.   multivitamin Tabs tablet Take 1 tablet by mouth at bedtime.   ondansetron 4 MG/2ML Soln injection Commonly known as: ZOFRAN Inject 2 mLs (4 mg total) into the vein every 6 (six) hours as needed for nausea or vomiting.   pantoprazole 40 MG tablet Commonly known as: PROTONIX Take 1 tablet (40 mg total) by mouth daily at 12 noon.   phenol 1.4 % Liqd Commonly known as: CHLORASEPTIC Use as directed 1 spray in the mouth or throat as needed for throat irritation / pain.   polyethylene glycol 17 g packet Commonly known as: MIRALAX / GLYCOLAX Place 17 g into feeding tube daily as needed (consitpation).   polyvinyl alcohol 1.4 % ophthalmic solution Commonly known as: LIQUIFILM TEARS Place 1 drop into both eyes as needed for dry eyes.   ramelteon 8 MG tablet Commonly known as: ROZEREM Take 1 tablet (8 mg total) by mouth at bedtime.   Resource ThickenUp Clear Powd Take 120 g by mouth as needed.   sucralfate 1 GM/10ML suspension Commonly known as: CARAFATE Take 10 mLs (1 g total) by mouth every 6 (six) hours.  traZODone 50 MG tablet Commonly known as: DESYREL Take 0.5 tablets (25 mg total) by mouth 3 times/day as needed-between meals & bedtime for sleep (If no response to ramelteon.).   white petrolatum Oint Commonly known as: VASELINE Apply 1 application topically as needed for lip care.      Follow-up Information    Luking, Elayne Snare, MD.   Specialty: Family Medicine Why: OFFICE-NO ANSWER/PLEASE CALL AND SCHEDULE APPT. Contact information: [address] [place] [address] [phone]           Signed: Loren Racer 10/03/2019, 10:38 AM

## 2019-10-03 NOTE — Progress Notes (Signed)
PT Cancellation Note  Patient Details Name: [name] MRN: [id number] DOB: Dec 13, 1950   Cancelled Treatment:    Reason Eval/Treat Not Completed: Other (comment). All notes in system for patient to transfer to CIR. Wife confirms patient to be transferred to CIR this afternoon.    Nalini Alcaraz 10/03/2019, 1:43 PM

## 2019-10-03 NOTE — Progress Notes (Signed)
[name], PT  Rehab Admission Coordinator  Physical Medicine and Rehabilitation  PMR Pre-admission  Signed  Date of Service:  10/02/2019  1:34 PM      Related encounter: ED to Hosp-Admission (Current) from 07/25/2019 in [name]      Signed        Show:Clear all '[x]'$ Manual'[x]'$ Template'[x]'$ Copied  Added by: '[x]'$ [name], PT  '[]'$ Hover for details PMR Admission Coordinator Pre-Admission Assessment   Patient: [name] is an 68 y.o., male MRN: [id number] DOB: 07/14/1951 Height: '5\' 10"'$  (177.8 cm) Weight: 74.1 kg                                                                                                                                                  Insurance Information HMO: yes    PPO:      PCP:      IPA:      80/20:      OTHER:  PRIMARY: [employer]      Policy#: [id number]      Subscriber: pt CM Name: [name]      Phone#: [phone] [id number]     Fax#: [phone] Pre-Cert#: [id number] [place] for CIR admission provided by [name] with [name], approved for 7 days with updates due to [name] on 12/17 to fax listed above      Employer: n/a Benefits:  Phone #: [phone]     Name:  Eff. Date: 10/23/2017     Deduct: $0 for in network providers      Out of Pocket Max: $3,400 (met $315.00)      Life Max: n/a CIR: $295/day for days 1-6      SNF:  20 full days Outpatient:     Co-Pay: $10-$40/visit, limited by medical necessity Home Health: 100%      Co-Pay:  DME: 80%     Co-Pay: 20% Providers: in network SECONDARY:       Policy#:       Subscriber:  CM Name:       Phone#:      Fax#:  Pre-Cert#:       Employer:  Benefits:  Phone #:      Name:  Eff. Date:      Deduct:       Out of Pocket Max:       Life Max:  CIR:       SNF:  Outpatient:      Co-Pay:  Home Health:       Co-Pay:  DME:      Co-Pay:    Medicaid Application Date:       Case Manager:  Disability Application Date:       Case Worker:    The "Data Collection Information  Summary" for patients in Inpatient Rehabilitation Facilities with attached "Privacy Act [place] Records" was  provided and verbally reviewed with: Patient and Family   Emergency Contact Information         Contact Information     Name Relation Home Work Mobile    [name] [name] [place]    [place] [name]     [phone]    [name] Son     [phone]    [place], [place]     [phone]       Current Medical History  Patient Admitting Diagnosis: CVA, post-Covid-19 debility   History of Present Illness: [name] is a 68 y.o. male originally diagnosed with COVID 19 on 07/13/19 with progressive tachypnea and SOB therefore admitted to [name] on 07/26/19 and treated with Remdesivir, CCP, decadron, Vancomycin/Cefepime and required intubation due to ARDs.  Hospital course significant for MSSA bacteremia with volume overload, hypotensive shock, AKI with metabolic acidosis requiring CRRT, Afib with RVR, melena due to gastric ulcer with ABLA treated with clipping by Dr. Cristina [name]. He developed PTX with empyema and large bronchopleural fistula with persistent leak requiring chest tube but continued to have persistent leak.  He underwent tracheostomy on 08/22/19, and was transitioned to [name] for ongoing Albert with pressor support. Dr. Roxan [name] followed for PTX and signed off on 11/17.  He developed decreased in LOC with inability to follow commands on 11/4 and was found to have acute/subacute infarcts involving bilateral cerebral cortex and right cerebellum. EEG showed moderate diffuse encephalopathy and Dr. Erlinda [name] felt that stroke likely due to hypotension secondary to Albert and sepsis --PAF could be potential etiology --ASA recommended for secondary stroke prevention with SBP range 120-140. Mental status changes felt to be due to encephalopathy as EEG negative for seizures. [name] on 11/10.  Family elected on full scope of care and chest tube  [name]/c 11/16, he has been decannulated on He was started on diet and has been advanced to dysphagia 2, nectars to allow for more palatable options. He had a prolonged course with downsizing trach 2/2 secretions, but was successfully decannulated on 11/30. Therapy ongoing and patient with delayed processing, difficulty sequencing, limited vocalization and mobilizing out of bed to chair with therapy. CIR recommended due to sigificant functional deficits post Covid-19 and CVA.         Past Medical History      Past Medical History:  Diagnosis Date  . COVID-19    . Heartburn        Family History  family history is not on file.   Prior Rehab/Hospitalizations:  Has the patient had prior rehab or hospitalizations prior to admission? No   Has the patient had major surgery during 100 days prior to admission? Yes   Current Medications    Current Facility-Administered Medications:  .  0.9 %  sodium chloride infusion, , Intravenous, PRN, [name], Stopped at 09/21/19 1659 .  acetaminophen (TYLENOL) solution 650 mg, 650 mg, Per Tube, Q4H PRN, [name], 650 mg at 09/22/19 0506 .  aspirin EC tablet 81 mg, 81 mg, Oral, Daily, [name], 81 mg at 10/02/19 1201 .  atorvastatin (LIPITOR) tablet 40 mg, 40 mg, Oral, q1800, [name], 40 mg at 10/02/19 1645 .  budesonide (PULMICORT) nebulizer solution 0.5 mg, 0.5 mg, Nebulization, BID, [name], Albert Mitts, [name], 0.5 mg at 10/03/19 0835 .  chlorhexidine (PERIDEX) 0.12 % solution 15 mL, 15 mL, Mouth/Throat, BID, [name], 15 mL at 10/03/19 0824 .  Chlorhexidine Gluconate Cloth 2 % PADS 6 each, 6 each, Topical, Q0600, [name], 6 each at 10/02/19 9326 .  [START ON 10/04/2019] Darbepoetin Alfa (ARANESP) injection 150 mcg, 150 mcg, Intravenous, Q Sat-Albert, Stovall, [name] .  feeding supplement (NEPRO CARB STEADY) liquid 237 mL, 237 mL, Oral, TID BM, Albert Jaffe, [name], 237 mL at 10/02/19  2119 .  feeding supplement (PRO-STAT SUGAR FREE 64) liquid 30 mL, 30 mL, Oral, BID, [name], 30 mL at 10/02/19 2120 .  guaiFENesin-dextromethorphan (ROBITUSSIN DM) 100-10 MG/5ML syrup 5 mL, 5 mL, Per Tube, Q4H PRN, Kirby-Graham, Karsten Fells, NP, 5 mL at 09/30/19 0338 .  heparin injection 5,000 Units, 5,000 Units, Subcutaneous, Q8H, [name], 5,000 Units at 10/03/19 0610 .  HYDROcodone-acetaminophen (HYCET) 7.5-325 mg/15 ml solution 10 mL, 10 mL, Oral, Q4H PRN, Albert Jaffe, [name], 10 mL at 09/30/19 0337 .  ipratropium-albuterol (DUONEB) 0.5-2.5 (3) MG/3ML nebulizer solution 3 mL, 3 mL, Nebulization, Q4H PRN, [name] .  multivitamin (RENA-VIT) tablet 1 tablet, 1 tablet, Oral, QHS, Hongalgi, Lenis Dickinson, [name], 1 tablet at 10/02/19 2119 .  ondansetron (ZOFRAN) injection 4 mg, 4 mg, Intravenous, Q6H PRN, Kyere, Belinda K, NP .  pantoprazole (PROTONIX) EC tablet 40 mg, 40 mg, Oral, Q1200, Edrick Oh, [name], 40 mg at 10/02/19 1200 .  phenol (CHLORASEPTIC) mouth spray 1 spray, 1 spray, Mouth/Throat, PRN, [name] .  pneumococcal 23 valent vaccine (PNU-IMMUNE) injection 0.5 mL, 0.5 mL, Intramuscular, Prior to discharge, [place]ikawa, [name] .  polyethylene glycol (MIRALAX / GLYCOLAX) packet 17 g, 17 g, Per Tube, Daily PRN, [name], 17 g at 09/29/19 1656 .  polyvinyl alcohol (LIQUIFILM TEARS) 1.4 % ophthalmic solution 1 drop, 1 drop, Both Eyes, PRN, [name], 1 drop at 09/14/19 2007 .  ramelteon (ROZEREM) tablet 8 mg, 8 mg, Oral, QHS, [name], 8 mg at 10/02/19 2119 .  Resource ThickenUp Clear, , Oral, PRN, Hongalgi, Lenis Dickinson, [name] .  Resource ThickenUp Clear, , Oral, PRN, Modena Jansky, [name] .  sucralfate (CARAFATE) 1 GM/10ML suspension 1 g, 1 g, Oral, Q6H, [name], 1 g at 10/03/19 0610 .  traZODone (DESYREL) tablet 25 mg, 25 mg, Oral, BID BM & HS PRN, [name] .  white petrolatum (VASELINE) gel 1 application, 1  application, Topical, PRN, Hongalgi, Lenis Dickinson, [name]   Patients Current Diet:     Diet Order                      Diet regular Room service appropriate? Yes; Fluid consistency: Thin  Diet effective now                   Precautions / Restrictions Precautions Precautions: Fall Precaution Comments: skin breakdown on heels, joints & sacrum Other Brace: bilateral prevalon boots Restrictions Weight Bearing Restrictions: Yes Other Position/Activity Restrictions: elevate LUE when not exercising    Has the patient had 2 or more [name] or a fall with injury in the past year?No   Prior Activity Level Community (5-7x/wk): recently retired, played golf daily, driving, no DME used prior to admission   Prior Functional Level Prior Function Level of Independence: Independent Comments: golfs everyday, retired 2 months    Self Care: Did the patient need help bathing, dressing, using the toilet or eating?  Independent   Indoor Mobility: Did the patient need  assistance with walking from room to room (with or without device)? Independent   Stairs: Did the patient need assistance with internal or external stairs (with or without device)? Independent   Functional Cognition: Did the patient need help planning regular tasks such as shopping or remembering to take medications? Independent   Home Assistive Devices / Equipment Home Assistive Devices/Equipment: None Home Equipment: None   Prior Device Use: Indicate devices/aids used by the patient prior to current illness, exacerbation or injury? None of the above   Current Functional Level Cognition   Arousal/Alertness: Awake/alert Overall Cognitive Status: Impaired/Different from baseline Difficult to assess due to: Tracheostomy Current Attention Level: Sustained Orientation Level: Oriented X4 Following Commands: Follows one step commands with increased time Safety/Judgement: Decreased awareness of safety, Decreased awareness of  deficits General Comments: pt stating he recently turned 87, he is 55 Attention: Focused Focused Attention: Impaired Focused Attention Impairment: Verbal basic, Functional basic    Extremity Assessment (includes Sensation/Coordination)   Upper Extremity Assessment: RUE deficits/detail, LUE deficits/detail RUE Deficits / Details: AROM WFL strength 3+/5 RUE Sensation: WNL RUE Coordination: decreased fine motor, decreased gross motor LUE Deficits / Details: flaccid, cues to use [name] UE to protect L UE during transfer LUE Sensation: decreased proprioception LUE Coordination: decreased fine motor, decreased gross motor  Lower Extremity Assessment: Defer to PT evaluation     ADLs   Overall ADL'[name] : Needs assistance/impaired Eating/Feeding: Set up Eating/Feeding Details (indicate cue type and reason): patient able to hold onto water bottle and take a drink Grooming: Oral care, Cueing for UE precautions, Minimal assistance, Wash/dry face, Sitting Grooming Details (indicate cue type and reason): in chair Upper Body Bathing: Maximal assistance, Sitting Upper Body Bathing Details (indicate cue type and reason): hand over hand support to guide washing L UE to increase awareness to UE Toileting- Clothing Manipulation and Hygiene: Total assistance, +2 for physical assistance, Bed level Toileting - Clothing Manipulation Details (indicate cue type and reason): RN changed dressing on buttocks wound Functional mobility during ADLs: Total assistance, +2 for physical assistance, Maximal assistance General ADL Comments: Pt is self feeding with set up when in chair.     Mobility   Overal bed mobility: Needs Assistance Bed Mobility: Rolling Rolling: Min assist, Mod assist Sidelying to sit: +2 for physical assistance, +2 for safety/equipment, Max assist Supine to sit: Max assist, +2 for physical assistance, +2 for safety/equipment Sit to supine: Total assist, +2 for physical assistance, +2 for  safety/equipment Sit to sidelying: Max assist, +2 for physical assistance, +2 for safety/equipment General bed mobility comments: max cuing for hemi technique & attend to LUE when rolling [name]     Transfers   Overall transfer level: Needs assistance Transfer via Lift Equipment: [place] transfer comment: Maximove utilized for transition OOB to chair.      Ambulation / Gait / Stairs / Proofreader / Balance Dynamic Sitting Balance Sitting balance - Comments: Pt initially needing mod A progressing to min guard Balance Overall balance assessment: Needs assistance Sitting-balance support: Feet supported, Single extremity supported Sitting balance-Leahy Scale: Poor Sitting balance - Comments: Pt initially needing mod A progressing to min guard Postural control: Left lateral lean Standing balance support: Single extremity supported Standing balance-Leahy Scale: Zero Standing balance comment: unable to attempt standing this session     Special needs/care consideration BiPAP/CPAP no CPM no Continuous Drip IV no Dialysis yes        Days TRS Life  Vest no Oxygen 1L via nasal cannula Special Bed vital golift  Trach Size decannualted 11/30 Wound Vac (area) no      Location n/a Skin Stage 2 pressure injury to sacrum, sores to distal shaft of penis, multiple skin tears, and skin breakdown areas from prolonged hospitalization Bowel mgmt: incontinent  Bladder mgmt: foley, making small amounts of urine  Diabetic mgmtno  Behavioral consideration  Chemo/radiation         Previous Home Environment (from acute therapy documentation) Living Arrangements: [name]/significant other  Lives With: [name] Available Help at Discharge: Family Type of Home: House Home Layout: One level Home Access: Stairs to enter Entrance Stairs-Rails: Right, Left Entrance Stairs-Number of Steps: 3 Bathroom Shower/Tub: Chiropodist: Handicapped height Bathroom  Accessibility: Yes Home Care Services: No   Discharge Living Setting Plans for Discharge Living Setting: Patient'[name] home Type of Home at Discharge: House Discharge Home Layout: One level Discharge Home Access: Stairs to enter Entrance Stairs-Rails: Left, Right Entrance Stairs-Number of Steps: 3 Discharge Bathroom Shower/Tub: Tub/shower unit Discharge Bathroom Toilet: Handicapped height Discharge Bathroom Accessibility: Yes How Accessible: Accessible via walker Does the patient have any problems obtaining your medications?: No   Social/Family/Support Systems Patient Roles: [name] Anticipated Caregiver: wife, Gaje Tennyson, and other family members Anticipated Caregiver'[name] Contact Information: [name] [phone] (c), [phone] (h) Ability/Limitations of Caregiver: min assist Caregiver Availability: 24/7 Discharge Plan Discussed with Primary Caregiver: Yes Is Caregiver In Agreement with Plan?: Yes Does Caregiver/Family have Issues with Lodging/Transportation while Pt is in Rehab?: No     Goals/Additional Needs Patient/Family Goal for Rehab: PT/OT min assist, SLP mod I Expected length of stay: 21-28 days Dietary Needs: reg/thin Additional Information: Pt with excellent family support and potential to make large gains with CIR level therapy.  Pt/Family Agrees to Admission and willing to participate: Yes Program Orientation Provided & Reviewed with Pt/Caregiver Including Roles  & Responsibilities: Yes  Barriers to Discharge: Hemodialysis     Decrease burden of Care through IP rehab admission: n/a     Possible need for SNF placement upon discharge: Not anticipated.      Patient Condition: This patient'[name] medical and functional status has changed since the consult dated: 09/15/2019 in which the Rehabilitation Physician determined and documented that the patient'[name] condition is appropriate for intensive rehabilitative care in an inpatient rehabilitation facility. See "History of Present  Illness" (above) for medical update. Functional changes are: pt currently min/mod assist to EOB, tolerating sitting in chair for several hours at a time. Patient'[name] medical and functional status update has been discussed with the Rehabilitation physician and patient remains appropriate for inpatient rehabilitation. Will admit to inpatient rehab today.   Preadmission Screen Completed By:  [name], PT, DPT 10/03/2019 10:36 AM ______________________________________________________________________   Discussed status with Dr. Adam [name] on 10/03/19 at 10:36 AM  and received approval for admission today.   Admission Coordinator:  [name], PT, DPT time 10:36 AM Sudie Grumbling 10/03/19           Cosigned by: Izora Ribas, [name] at 10/03/2019 11:12 AM  Revision History

## 2019-10-03 NOTE — H&P (Signed)
Physical Medicine and Rehabilitation Admission H&P  Chief Complaint: Impaired mobility and ADLs  HPI: [name] is a 68 year old right-handed male with history of GERD who presented to [facility] 07/26/2019 with shortness of breath and hypoxia.  He was diagnosed with COVID-19 on September 20 of 2020.  Oxygen saturations in the 40th percentile range.  Placed on a nonrebreather mask.  Patient was given IV steroids and remdesivir that he completed 07/30/2019.  He did require intubation for airway protection.  He had 1 unit of convalescent plasma and had received Decadron until 08/04/2019.  He was treated with vancomycin and cefepime through 08/02/2019.  Initial chest x-ray demonstrated diffuse multifocal infiltrates.  CT of the chest negative for pulmonary emboli.  He was admitted to [facility] pH 7.41, PCO2 44 PO2 of 87 on 100% and AA gradient calculates out to 571.  Noted elevated creatinine of 1.51.  Renal service is consulted for elevated creatinine/AKI necessitating the need for hemodialysis 08/07/2019 but has since been deemed chronic Tuesday Thursday Saturday schedule.  Gastroenterology services consulted for ongoing melena drop in hemoglobin and patient was transfused 10 units of packed red blood cells total throughout hospital admission with latest hemoglobin 8.4.  An endoscopy was completed showing no active bleeding or blood in the stomach.  Very large gastric residual mostly tube feeding without anatomic gastric outlet obstruction suggestive gastric ileus.  Patient's hemoglobin hematocrit have stabilized.  Long-term intubation requiring tracheostomy tube 08/22/2019 patient is since been extubated.  Diet has been advanced to regular consistency.  Subcutaneous heparin for DVT prophylaxis.  Hospital course complicated by bouts of atrial fibrillation with RVR 08/09/2019 patient was initially placed on amiodarone.  Neurology service was consulted 08/27/2019 for altered mental status  with MRI of the brain showing small acute to early subacute infarcts in bilateral cerebral cortex and right cerebellum and maintained on low-dose aspirin for CVA prophylaxis.. Patient with development of sacral decubitus wound care dressing changes as per wound care nurse as well as wound care to partial-thickness areas of tissue loss at posterior aspect of penile shaft again with dressing changes as directed.  Therapy evaluations completed patient was admitted for a comprehensive rehab program.  Review of Systems  Constitutional: Positive for fever.  HENT: Negative for hearing loss.   Eyes: Negative for blurred vision and double vision.  Respiratory: Positive for cough and shortness of breath.   Cardiovascular: Positive for leg swelling. Negative for chest pain and palpitations.  Gastrointestinal: Positive for blood in stool and constipation. Negative for heartburn and nausea.  Genitourinary: Negative for dysuria, flank pain and hematuria.  Musculoskeletal: Positive for joint pain.  Skin: Negative for rash.  All other systems reviewed and are negative.  Past Medical History:  Diagnosis Date  . COVID-19   . Heartburn    Past Surgical History:  Procedure Laterality Date  . ACHILLES TENDON SURGERY Right 2012  . COLONOSCOPY  2009   [facility]  . ESOPHAGOGASTRODUODENOSCOPY N/A 12/25/2014   Procedure: ESOPHAGOGASTRODUODENOSCOPY (EGD);  Surgeon: Danie Binder, MD;  Location: [facility];  Service: Endoscopy;  Laterality: N/A;  830am  . ESOPHAGOGASTRODUODENOSCOPY N/A 08/09/2019   Procedure: ESOPHAGOGASTRODUODENOSCOPY (EGD);  Surgeon: Ronald Lobo, MD;  Location: [facility];  Service: Endoscopy;  Laterality: N/A;  Patient is already sedated, so I do not anticipate need for further sedation.  Okay from my standpoint to do either at the bedside or in the operating room  . HEMOSTASIS CLIP PLACEMENT  08/09/2019   Procedure:  HEMOSTASIS CLIP PLACEMENT;  Surgeon: Ronald Lobo, MD;  Location: [facility]  ENDOSCOPY;  Service: Endoscopy;;  . HEMOSTASIS CONTROL  08/09/2019   Procedure: HEMOSTASIS CONTROL;  Surgeon: Ronald Lobo, MD;  Location: [facility] ENDOSCOPY;  Service: Endoscopy;;  epi   . IR FLUORO GUIDE CV LINE LEFT  09/02/2019  . IR US GUIDE VASC ACCESS LEFT  09/02/2019  . KNEE SURGERY Left 1980's  . none    . WRIST SURGERY Left 1970's   Family History  Problem Relation Age of Onset  . Colon cancer Neg Hx   . Colon polyps Neg Hx    Social History:  reports that he has never smoked. He has never used smokeless tobacco. He reports current alcohol use of about 2.0 standard drinks of alcohol per week. He reports that he does not use drugs. Allergies: No Known Allergies Medications Prior to Admission  Medication Sig Dispense Refill  . acetaminophen (TYLENOL) 160 MG/5ML solution Place 20.3 mLs (650 mg total) into feeding tube every 4 (four) hours as needed for fever. 120 mL 0  . Amino Acids-Protein Hydrolys (FEEDING SUPPLEMENT, PRO-STAT SUGAR FREE 64,) LIQD Take 30 mLs by mouth 2 (two) times daily. 887 mL 0  . aspirin EC 81 MG EC tablet Take 1 tablet (81 mg total) by mouth daily.    Marland Kitchen atorvastatin (LIPITOR) 40 MG tablet Take 1 tablet (40 mg total) by mouth daily at 6 PM.    . budesonide (PULMICORT) 0.5 MG/2ML nebulizer solution Take 2 mLs (0.5 mg total) by nebulization 2 (two) times daily. 120 mL 12  . chlorhexidine (PERIDEX) 0.12 % solution Use as directed 15 mLs in the mouth or throat 2 (two) times daily. 120 mL 0  . Chlorhexidine Gluconate Cloth 2 % PADS Apply 6 each topically daily at 6 (six) AM.    . [START ON 10/04/2019] Darbepoetin Alfa (ARANESP) 150 MCG/0.3ML SOSY injection Inject 0.3 mLs (150 mcg total) into the vein every Saturday with hemodialysis. 1.68 mL   . guaiFENesin-dextromethorphan (ROBITUSSIN DM) 100-10 MG/5ML syrup Place 5 mLs into feeding tube every 4 (four) hours as needed for cough. 118 mL 0  . heparin 5000 UNIT/ML injection Inject 1 mL (5,000 Units total) into the skin  every 8 (eight) hours. 1 mL   . ipratropium-albuterol (DUONEB) 0.5-2.5 (3) MG/3ML SOLN Take 3 mLs by nebulization every 4 (four) hours as needed. 360 mL   . Maltodextrin-Xanthan Gum (RESOURCE THICKENUP CLEAR) POWD Take 120 g by mouth as needed.    . multivitamin (RENA-VIT) TABS tablet Take 1 tablet by mouth at bedtime.  0  . Nutritional Supplements (FEEDING SUPPLEMENT, NEPRO CARB STEADY,) LIQD Take 237 mLs by mouth 3 (three) times daily between meals.  0  . ondansetron (ZOFRAN) 4 MG/2ML SOLN injection Inject 2 mLs (4 mg total) into the vein every 6 (six) hours as needed for nausea or vomiting. 2 mL 0  . pantoprazole (PROTONIX) 40 MG tablet Take 1 tablet (40 mg total) by mouth daily at 12 noon.    . phenol (CHLORASEPTIC) 1.4 % LIQD Use as directed 1 spray in the mouth or throat as needed for throat irritation / pain.  0  . polyethylene glycol (MIRALAX / GLYCOLAX) 17 g packet Place 17 g into feeding tube daily as needed (consitpation). 14 each 0  . polyvinyl alcohol (LIQUIFILM TEARS) 1.4 % ophthalmic solution Place 1 drop into both eyes as needed for dry eyes. 15 mL 0  . ramelteon (ROZEREM) 8 MG tablet Take 1 tablet (8  mg total) by mouth at bedtime.    . sucralfate (CARAFATE) 1 GM/10ML suspension Take 10 mLs (1 g total) by mouth every 6 (six) hours. 420 mL 0  . traZODone (DESYREL) 50 MG tablet Take 0.5 tablets (25 mg total) by mouth 3 times/day as needed-between meals & bedtime for sleep (If no response to ramelteon.).    Marland Kitchen white petrolatum (VASELINE) OINT Apply 1 application topically as needed for lip care.  0    Drug Regimen Review Drug regimen was reviewed and remains appropriate with no significant issues identified  Home: Home Living Family/patient expects to be discharged to:: Private residence Living Arrangements: Spouse/significant other Available Help at Discharge: Family Type of Home: House Home Access: Stairs to enter Technical brewer of Steps: 3 Entrance Stairs-Rails:  Right, Left Home Layout: One level Bathroom Shower/Tub: Chiropodist: Handicapped height Bathroom Accessibility: Yes Home Equipment: None  Lives With: Spouse   Functional History: Prior Function Level of Independence: Independent Comments: golfs everyday, retired 2 months   Functional Status:  Mobility: Bed Mobility Overal bed mobility: Needs Assistance Bed Mobility: Rolling Rolling: Min assist, Mod assist Sidelying to sit: +2 for physical assistance, +2 for safety/equipment, Max assist Supine to sit: Max assist, +2 for physical assistance, +2 for safety/equipment Sit to supine: Total assist, +2 for physical assistance, +2 for safety/equipment Sit to sidelying: Max assist, +2 for physical assistance, +2 for safety/equipment General bed mobility comments: max cuing for hemi technique & attend to LUE when rolling R Transfers Overall transfer level: Needs assistance Transfer via Lift Equipment: [place] transfer comment: Maximove utilized for transition OOB to chair.   ADL: ADL Overall ADL's : Needs assistance/impaired Eating/Feeding: Set up Eating/Feeding Details (indicate cue type and reason): patient able to hold onto water bottle and take a drink Grooming: Oral care, Cueing for UE precautions, Minimal assistance, Wash/dry face, Sitting Grooming Details (indicate cue type and reason): in chair Upper Body Bathing: Maximal assistance, Sitting Upper Body Bathing Details (indicate cue type and reason): hand over hand support to guide washing L UE to increase awareness to UE Toileting- Clothing Manipulation and Hygiene: Total assistance, +2 for physical assistance, Bed level Toileting - Clothing Manipulation Details (indicate cue type and reason): RN changed dressing on buttocks wound Functional mobility during ADLs: Total assistance, +2 for physical assistance, Maximal assistance General ADL Comments: Pt is self feeding with set up when in  chair.  Cognition: Cognition Overall Cognitive Status: Impaired/Different from baseline Arousal/Alertness: Awake/alert Orientation Level: Oriented X4 Attention: Focused Focused Attention: Impaired Focused Attention Impairment: Verbal basic, Functional basic Cognition Arousal/Alertness: Awake/alert Behavior During Therapy: Flat affect Overall Cognitive Status: Impaired/Different from baseline Area of Impairment: Memory, Safety/judgement, Following commands, Problem solving, Orientation, Attention, Awareness Orientation Level: Disoriented to, Time Current Attention Level: Sustained Memory: Decreased short-term memory Following Commands: Follows one step commands with increased time Safety/Judgement: Decreased awareness of safety, Decreased awareness of deficits Awareness: Intellectual Problem Solving: Slow processing, Requires verbal cues General Comments: pt stating he recently turned 89, he is 68 Difficult to assess due to: Tracheostomy  Physical Exam: There were no vitals taken for this visit. Gen: no distress, normal appearing HEENT: oral mucosa pink and moist, NCAT Cardio: Reg rate Chest: normal effort, normal rate of breathing Abd: soft, non-distended Ext: no edema Skin: intact Musculoskeletal: 4/5 strength throughout. No focal deficits, but diffusely deconditioned. Psych: pleasant, normal affect Neurological: AOx3. He makes good eye contact with examiner.  His wife is at bedside.  Speech is a bit  hoarse but intelligible.  Follows commands.  He is able to provide his name and age but was a very limited medical historian from a long hospital complicated course.   Results for orders placed or performed during the hospital encounter of 07/25/19 (from the past 48 hour(s))  Glucose, capillary     Status: None   Collection Time: 10/01/19  4:02 PM  Result Value Ref Range   Glucose-Capillary 91 70 - 99 mg/dL  Glucose, capillary     Status: None   Collection Time: 10/01/19   9:32 PM  Result Value Ref Range   Glucose-Capillary 91 70 - 99 mg/dL  Glucose, capillary     Status: Abnormal   Collection Time: 10/02/19  6:35 AM  Result Value Ref Range   Glucose-Capillary 130 (H) 70 - 99 mg/dL  Renal function panel     Status: Abnormal   Collection Time: 10/02/19  8:00 AM  Result Value Ref Range   Sodium 133 (L) 135 - 145 mmol/L   Potassium 3.9 3.5 - 5.1 mmol/L   Chloride 95 (L) 98 - 111 mmol/L   CO2 28 22 - 32 mmol/L   Glucose, Bld 104 (H) 70 - 99 mg/dL   BUN 33 (H) 8 - 23 mg/dL   Creatinine, Ser 5.04 (H) 0.61 - 1.24 mg/dL   Calcium 9.3 8.9 - 10.3 mg/dL   Phosphorus 3.0 2.5 - 4.6 mg/dL   Albumin 2.0 (L) 3.5 - 5.0 g/dL   GFR calc non Af Amer 11 (L) >60 mL/min   GFR calc Af Amer 13 (L) >60 mL/min   Anion gap 10 5 - 15    Comment: Performed at [facility], [address], [place], [place] [address]  CBC     Status: Abnormal   Collection Time: 10/02/19  8:02 AM  Result Value Ref Range   WBC 13.4 (H) 4.0 - 10.5 K/uL   RBC 2.70 (L) 4.22 - 5.81 MIL/uL   Hemoglobin 7.7 (L) 13.0 - 17.0 g/dL   HCT 25.0 (L) 39.0 - 52.0 %   MCV 92.6 80.0 - 100.0 fL   MCH 28.5 26.0 - 34.0 pg   MCHC 30.8 30.0 - 36.0 g/dL   RDW 15.1 11.5 - 15.5 %   Platelets 362 150 - 400 K/uL   nRBC 0.0 0.0 - 0.2 %    Comment: Performed at [facility], [place] [address], [place], [place] [address]  Glucose, capillary     Status: None   Collection Time: 10/02/19 11:42 AM  Result Value Ref Range   Glucose-Capillary 92 70 - 99 mg/dL  Glucose, capillary     Status: None   Collection Time: 10/02/19  5:19 PM  Result Value Ref Range   Glucose-Capillary 86 70 - 99 mg/dL  Glucose, capillary     Status: Abnormal   Collection Time: 10/02/19  9:05 PM  Result Value Ref Range   Glucose-Capillary 125 (H) 70 - 99 mg/dL  CBC-daily     Status: Abnormal   Collection Time: 10/03/19  5:55 AM  Result Value Ref Range   WBC 11.3 (H) 4.0 - 10.5 K/uL   RBC 2.97 (L) 4.22 - 5.81 MIL/uL   Hemoglobin 8.4  (L) 13.0 - 17.0 g/dL   HCT 27.5 (L) 39.0 - 52.0 %   MCV 92.6 80.0 - 100.0 fL   MCH 28.3 26.0 - 34.0 pg   MCHC 30.5 30.0 - 36.0 g/dL   RDW 15.6 (H) 11.5 - 15.5 %  Platelets 281 150 - 400 K/uL   nRBC 0.2 0.0 - 0.2 %    Comment: Performed at [facility], [place] [address] [place], [place] [address]  Glucose, capillary     Status: None   Collection Time: 10/03/19  7:15 AM  Result Value Ref Range   Glucose-Capillary 79 70 - 99 mg/dL  Glucose, capillary     Status: None   Collection Time: 10/03/19 11:22 AM  Result Value Ref Range   Glucose-Capillary 95 70 - 99 mg/dL   No results found.  Medical Problem List and Plan: 1.  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  -patient may shower  -ELOS/Goals: modI PT, OT, SLP 2.  Antithrombotics: -DVT/anticoagulation: Subcutaneous heparin.  Check vascular study  -antiplatelet therapy: Aspirin 81 mg daily 3. Pain Management: Hycet as needed 4. Mood: Provide emotional support. Rozerem 8 mg nightly  -antipsychotic agents: N/A 5. Neuropsych: This patient is capable of making decisions on his own behalf.  SLP recommends additional diagnostics of higher level cognitive skills.  6. Skin/Wound Care: Wound care sacral decubitus.  Cleanse with normal saline pat dry cover with silicone foam dressing for sacrum.  Wound care nurse follow-up as well as wound care to partial-thickness area of soft tissue loss of posterior aspect of penile shaft.  Prevalon boots as well as mattress replacement.  Keep heels off bed. 7. Fluids/Electrolytes/Nutrition: Routine in and outs with follow-up chemistries 8.  Anemia/melena.  Follow-up CBC.  Continue Aranesp 9.  Tracheostomy 08/22/2019.  Decannulated.  Diet advanced to regular.  Check oxygen saturations every shift.  Continue nebulizers as  directed 10.  MSSA bacteremia/pneumonia.  Antibiotic therapy completed 11.  Hyperlipidemia.  Lipitor 12. Oropharyngeal dysphagia: Appreciate speech therapy recommendations; regular diet with oral care BID  Lavon Paganini Angiulli, PA-C 10/03/2019   I have personally performed a face to face diagnostic evaluation, including, but not limited to relevant history and physical exam findings, of this patient and developed relevant assessment and plan.  Additionally, I have reviewed and concur with the physician assistant's documentation above.  The patient's status has not changed. The original post admission physician evaluation remains appropriate, and any changes from the pre-admission screening or documentation from the acute chart are noted above.   Leeroy Cha, MD

## 2019-10-03 NOTE — H&P (Signed)
Physical Medicine and Rehabilitation Admission H&P    Chief Complaint  Patient presents with  . Shortness of Breath  : HPI: [name] is a 68 year old right-handed male with history of GERD who presented to [facility] 07/26/2019 with shortness of breath and hypoxia.  He was diagnosed with COVID-19 on September 20 of 2020.  Oxygen saturations in the 40th percentile range.  Placed on a nonrebreather mask.  Patient was given IV steroids and remdesivir that he completed 07/30/2019.  He did require intubation for airway protection.  He had 1 unit of convalescent plasma and had received Decadron until 08/04/2019.  He was treated with vancomycin and cefepime through 08/02/2019.  Initial chest x-ray demonstrated diffuse multifocal infiltrates.  CT of the chest negative for pulmonary emboli.  He was admitted to [place] pH 7.41, PCO2 44 PO2 of 87 on 100% and AA gradient calculates out to 571.  Noted elevated creatinine of 1.51.  Renal service is consulted for elevated creatinine/AKI necessitating the need for hemodialysis 08/07/2019 but has since been deemed chronic Tuesday Thursday Saturday schedule.  Gastroenterology services consulted for ongoing melena drop in hemoglobin and patient was transfused 10 units of packed red blood cells total throughout hospital admission with latest hemoglobin 8.4.  An endoscopy was completed showing no active bleeding or blood in the stomach.  Very large gastric residual mostly tube feeding without anatomic gastric outlet obstruction suggestive gastric ileus.  Patient's hemoglobin hematocrit have stabilized.  Long-term intubation requiring tracheostomy tube 08/22/2019 patient is since been extubated.  Diet has been advanced to regular consistency.  Subcutaneous heparin for DVT prophylaxis.  Hospital course complicated by bouts of atrial fibrillation with RVR 08/09/2019 patient was initially placed on amiodarone.  Neurology service was consulted 08/27/2019  for altered mental status with MRI of the brain showing small acute to early subacute infarcts in bilateral cerebral cortex and right cerebellum and maintained on low-dose aspirin for CVA prophylaxis.. Patient with development of sacral decubitus wound care dressing changes as per wound care nurse as well as wound care to partial-thickness areas of tissue loss at posterior aspect of penile shaft again with dressing changes as directed.  Therapy evaluations completed patient was admitted for a comprehensive rehab program.  Review of Systems  Constitutional: Positive for fever.  HENT: Negative for hearing loss.   Eyes: Negative for blurred vision and double vision.  Respiratory: Positive for cough and shortness of breath.   Cardiovascular: Positive for leg swelling. Negative for chest pain and palpitations.  Gastrointestinal: Positive for blood in stool and constipation. Negative for heartburn and nausea.  Genitourinary: Negative for dysuria, flank pain and hematuria.  Musculoskeletal: Positive for joint pain.  Skin: Negative for rash.  All other systems reviewed and are negative.  Past Medical History:  Diagnosis Date  . COVID-19   . Heartburn    Past Surgical History:  Procedure Laterality Date  . ACHILLES TENDON SURGERY Right 2012  . COLONOSCOPY  2009   [facility]  . ESOPHAGOGASTRODUODENOSCOPY N/A 12/25/2014   Procedure: ESOPHAGOGASTRODUODENOSCOPY (EGD);  Surgeon: Danie Binder, MD;  Location: [facility];  Service: Endoscopy;  Laterality: N/A;  830am  . ESOPHAGOGASTRODUODENOSCOPY N/A 08/09/2019   Procedure: ESOPHAGOGASTRODUODENOSCOPY (EGD);  Surgeon: Ronald Lobo, MD;  Location: [facility];  Service: Endoscopy;  Laterality: N/A;  Patient is already sedated, so I do not anticipate need for further sedation.  Okay from my standpoint to do either at the bedside or in the operating room  .  HEMOSTASIS CLIP PLACEMENT  08/09/2019   Procedure: HEMOSTASIS CLIP PLACEMENT;  Surgeon: Ronald Lobo, MD;  Location: [facility];  Service: Endoscopy;;  . HEMOSTASIS CONTROL  08/09/2019   Procedure: HEMOSTASIS CONTROL;  Surgeon: Ronald Lobo, MD;  Location: [facility];  Service: Endoscopy;;  epi   . IR FLUORO GUIDE CV LINE LEFT  09/02/2019  . IR US GUIDE VASC ACCESS LEFT  09/02/2019  . KNEE SURGERY Left 1980's  . none    . WRIST SURGERY Left 1970's   Family History  Problem Relation Age of Onset  . Colon cancer Neg Hx   . Colon polyps Neg Hx    Social History:  reports that he has never smoked. He has never used smokeless tobacco. He reports current alcohol use of about 2.0 standard drinks of alcohol per week. He reports that he does not use drugs. Allergies: No Known Allergies Medications Prior to Admission  Medication Sig Dispense Refill  . ondansetron (ZOFRAN ODT) 4 MG disintegrating tablet Take 1 tablet (4 mg total) by mouth every 8 (eight) hours as needed for nausea or vomiting. 20 tablet 0    Drug Regimen Review Drug regimen was reviewed and remains appropriate with no significant issues identified  Home: Home Living Family/patient expects to be discharged to:: Private residence Living Arrangements: Spouse/significant other Available Help at Discharge: Family Type of Home: House Home Access: Stairs to enter Technical brewer of Steps: 3 Entrance Stairs-Rails: Right, Left Home Layout: One level Bathroom Shower/Tub: Chiropodist: Handicapped height Bathroom Accessibility: Yes Home Equipment: None  Lives With: Spouse   Functional History: Prior Function Level of Independence: Independent Comments: golfs everyday, retired 2 months   Functional Status:  Mobility: Bed Mobility Overal bed mobility: Needs Assistance Bed Mobility: Rolling Rolling: Min assist, Mod assist Sidelying to sit: +2 for physical assistance, +2 for safety/equipment, Max assist Supine to sit: Max assist, +2 for physical assistance, +2 for  safety/equipment Sit to supine: Total assist, +2 for physical assistance, +2 for safety/equipment Sit to sidelying: Max assist, +2 for physical assistance, +2 for safety/equipment General bed mobility comments: max cuing for hemi technique & attend to LUE when rolling R Transfers Overall transfer level: Needs assistance Transfer via Lift Equipment: [place] transfer comment: Maximove utilized for transition OOB to chair.       ADL: ADL Overall ADL's : Needs assistance/impaired Eating/Feeding: Set up Eating/Feeding Details (indicate cue type and reason): patient able to hold onto water bottle and take a drink Grooming: Oral care, Cueing for UE precautions, Minimal assistance, Wash/dry face, Sitting Grooming Details (indicate cue type and reason): in chair Upper Body Bathing: Maximal assistance, Sitting Upper Body Bathing Details (indicate cue type and reason): hand over hand support to guide washing L UE to increase awareness to UE Toileting- Clothing Manipulation and Hygiene: Total assistance, +2 for physical assistance, Bed level Toileting - Clothing Manipulation Details (indicate cue type and reason): RN changed dressing on buttocks wound Functional mobility during ADLs: Total assistance, +2 for physical assistance, Maximal assistance General ADL Comments: Pt is self feeding with set up when in chair.  Cognition: Cognition Overall Cognitive Status: Impaired/Different from baseline Arousal/Alertness: Awake/alert Orientation Level: Oriented X4 Attention: Focused Focused Attention: Impaired Focused Attention Impairment: Verbal basic, Functional basic Cognition Arousal/Alertness: Awake/alert Behavior During Therapy: Flat affect Overall Cognitive Status: Impaired/Different from baseline Area of Impairment: Memory, Safety/judgement, Following commands, Problem solving, Orientation, Attention, Awareness Orientation Level: Disoriented to, Time Current Attention Level:  Sustained Memory: Decreased short-term memory  Following Commands: Follows one step commands with increased time Safety/Judgement: Decreased awareness of safety, Decreased awareness of deficits Awareness: Intellectual Problem Solving: Slow processing, Requires verbal cues General Comments: pt stating he recently turned 58, he is 68 Difficult to assess due to: Tracheostomy  Physical Exam: Blood pressure 134/78, pulse 90, temperature 98.7 F (37.1 C), temperature source Oral, resp. rate 18, height 5\' 10"  (1.778 m), weight 74.1 kg, SpO2 100 %.  Physical Exam  Gen: no distress, normal appearing HEENT: oral mucosa pink and moist, NCAT Cardio: Reg rate Chest: normal effort, normal rate of breathing Abd: soft, non-distended Ext: no edema Skin: intact Musculoskeletal: 4/5 strength throughout. No focal deficits, but diffusely deconditioned. Psych: pleasant, normal affect Neurological: AOx3. He makes good eye contact with examiner.  His wife is at bedside.  Speech is a bit hoarse but intelligible.  Follows commands.  He is able to provide his name and age but was a very limited medical historian from a long hospital complicated course.   Results for orders placed or performed during the hospital encounter of 07/25/19 (from the past 48 hour(s))  Glucose, capillary     Status: None   Collection Time: 10/01/19  4:02 PM  Result Value Ref Range   Glucose-Capillary 91 70 - 99 mg/dL  Glucose, capillary     Status: None   Collection Time: 10/01/19  9:32 PM  Result Value Ref Range   Glucose-Capillary 91 70 - 99 mg/dL  Glucose, capillary     Status: Abnormal   Collection Time: 10/02/19  6:35 AM  Result Value Ref Range   Glucose-Capillary 130 (H) 70 - 99 mg/dL  Renal function panel     Status: Abnormal   Collection Time: 10/02/19  8:00 AM  Result Value Ref Range   Sodium 133 (L) 135 - 145 mmol/L   Potassium 3.9 3.5 - 5.1 mmol/L   Chloride 95 (L) 98 - 111 mmol/L   CO2 28 22 - 32 mmol/L    Glucose, Bld 104 (H) 70 - 99 mg/dL   BUN 33 (H) 8 - 23 mg/dL   Creatinine, Ser 5.04 (H) 0.61 - 1.24 mg/dL   Calcium 9.3 8.9 - 10.3 mg/dL   Phosphorus 3.0 2.5 - 4.6 mg/dL   Albumin 2.0 (L) 3.5 - 5.0 g/dL   GFR calc non Af Amer 11 (L) >60 mL/min   GFR calc Af Amer 13 (L) >60 mL/min   Anion gap 10 5 - 15    Comment: Performed at [facility], [address] [place], [place] [address]  CBC     Status: Abnormal   Collection Time: 10/02/19  8:02 AM  Result Value Ref Range   WBC 13.4 (H) 4.0 - 10.5 K/uL   RBC 2.70 (L) 4.22 - 5.81 MIL/uL   Hemoglobin 7.7 (L) 13.0 - 17.0 g/dL   HCT 25.0 (L) 39.0 - 52.0 %   MCV 92.6 80.0 - 100.0 fL   MCH 28.5 26.0 - 34.0 pg   MCHC 30.8 30.0 - 36.0 g/dL   RDW 15.1 11.5 - 15.5 %   Platelets 362 150 - 400 K/uL   nRBC 0.0 0.0 - 0.2 %    Comment: Performed at [facility], [place] [address] [place], [place] [address]  Glucose, capillary     Status: None   Collection Time: 10/02/19 11:42 AM  Result Value Ref Range   Glucose-Capillary 92 70 - 99 mg/dL  Glucose, capillary     Status: None  Collection Time: 10/02/19  5:19 PM  Result Value Ref Range   Glucose-Capillary 86 70 - 99 mg/dL  Glucose, capillary     Status: Abnormal   Collection Time: 10/02/19  9:05 PM  Result Value Ref Range   Glucose-Capillary 125 (H) 70 - 99 mg/dL  CBC-daily     Status: Abnormal   Collection Time: 10/03/19  5:55 AM  Result Value Ref Range   WBC 11.3 (H) 4.0 - 10.5 K/uL   RBC 2.97 (L) 4.22 - 5.81 MIL/uL   Hemoglobin 8.4 (L) 13.0 - 17.0 g/dL   HCT 27.5 (L) 39.0 - 52.0 %   MCV 92.6 80.0 - 100.0 fL   MCH 28.3 26.0 - 34.0 pg   MCHC 30.5 30.0 - 36.0 g/dL   RDW 15.6 (H) 11.5 - 15.5 %   Platelets 281 150 - 400 K/uL   nRBC 0.2 0.0 - 0.2 %    Comment: Performed at [facility], [address], [place], [place] [address]  Glucose, capillary     Status: None   Collection Time: 10/03/19  7:15 AM  Result Value Ref Range   Glucose-Capillary 79 70 - 99 mg/dL   No  results found.     Medical Problem List and Plan: 1.  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  -patient may shower  -ELOS/Goals: modI PT, OT, SLP 2.  Antithrombotics: -DVT/anticoagulation: Subcutaneous heparin.  Check vascular study  -antiplatelet therapy: Aspirin 81 mg daily 3. Pain Management: Hycet as needed 4. Mood: Provide emotional support. Rozerem 8 mg nightly  -antipsychotic agents: N/A 5. Neuropsych: This patient is capable of making decisions on his own behalf.  SLP recommends additional diagnostics of higher level cognitive skills.  6. Skin/Wound Care: Wound care sacral decubitus.  Cleanse with normal saline pat dry cover with silicone foam dressing for sacrum.  Wound care nurse follow-up as well as wound care to partial-thickness area of soft tissue loss of posterior aspect of penile shaft.  Prevalon boots as well as mattress replacement.  Keep heels off bed. 7. Fluids/Electrolytes/Nutrition: Routine in and outs with follow-up chemistries 8.  Anemia/melena.  Follow-up CBC.  Continue Aranesp 9.  Tracheostomy 08/22/2019.  Decannulated.  Diet advanced to regular.  Check oxygen saturations every shift.  Continue nebulizers as directed 10.  MSSA bacteremia/pneumonia.  Antibiotic therapy completed 11.  Hyperlipidemia.  Lipitor 12. Oropharyngeal dysphagia: Appreciate speech therapy recommendations; regular diet with oral care BID  Lavon Paganini Angiulli, PA-C 10/03/2019   I have personally performed a face to face diagnostic evaluation, including, but not limited to relevant history and physical exam findings, of this patient and developed relevant assessment and plan.  Additionally, I have reviewed and concur with the physician assistant's documentation above.  Leeroy Cha, MD

## 2019-10-04 ENCOUNTER — Inpatient Hospital Stay (HOSPITAL_COMMUNITY): Payer: Medicare HMO | Admitting: Speech Pathology

## 2019-10-04 ENCOUNTER — Inpatient Hospital Stay (HOSPITAL_COMMUNITY): Payer: Medicare HMO | Admitting: Occupational Therapy

## 2019-10-04 ENCOUNTER — Inpatient Hospital Stay (HOSPITAL_COMMUNITY): Payer: Medicare HMO | Admitting: Physical Therapy

## 2019-10-04 DIAGNOSIS — U071 COVID-19: Secondary | ICD-10-CM

## 2019-10-04 DIAGNOSIS — J1289 Other viral pneumonia: Secondary | ICD-10-CM

## 2019-10-04 DIAGNOSIS — R1312 Dysphagia, oropharyngeal phase: Secondary | ICD-10-CM

## 2019-10-04 DIAGNOSIS — L89153 Pressure ulcer of sacral region, stage 3: Secondary | ICD-10-CM

## 2019-10-04 MED ORDER — HEPARIN SODIUM (PORCINE) 1000 UNIT/ML IJ SOLN
INTRAMUSCULAR | Status: AC
  Administered 2019-10-04: 3800 [IU] via ARTERIOVENOUS_FISTULA
  Filled 2019-10-04: qty 4

## 2019-10-04 MED ORDER — DARBEPOETIN ALFA 150 MCG/0.3ML IJ SOSY
PREFILLED_SYRINGE | INTRAMUSCULAR | Status: AC
  Administered 2019-10-04: 150 ug via INTRAVENOUS
  Filled 2019-10-04: qty 0.3

## 2019-10-04 MED ORDER — CHLORHEXIDINE GLUCONATE CLOTH 2 % EX PADS
6.0000 | MEDICATED_PAD | Freq: Every day | CUTANEOUS | Status: DC
  Administered 2019-10-04 – 2019-10-05 (×2): 6 via TOPICAL

## 2019-10-04 MED ORDER — MAGIC MOUTHWASH W/LIDOCAINE
5.0000 mL | Freq: Four times a day (QID) | ORAL | Status: DC
  Administered 2019-10-04 – 2019-11-04 (×67): 5 mL via ORAL
  Filled 2019-10-04 (×86): qty 5

## 2019-10-04 NOTE — Evaluation (Signed)
Occupational Therapy Assessment and Plan  Patient Details  Name: [name] MRN: [id number] Date of Birth: 1951/08/22  OT Diagnosis: abnormal posture, acute pain, apraxia, ataxia, cognitive deficits, hemiplegia affecting non-dominant side, muscular wasting and disuse atrophy and muscle weakness (generalized) Rehab Potential: Rehab Potential (ACUTE ONLY): Good ELOS: 3.5-4 weeks   Today's Date: 10/04/2019 OT Individual Time: 7902-4097 OT Individual Time Calculation (min): 60 min     Problem List:  Patient Active Problem List   Diagnosis Date Noted  . Oropharyngeal dysphagia 10/03/2019  . Debility 10/03/2019  . Acute blood loss anemia 09/06/2019  . H. pylori infection 09/06/2019  . CVA (cerebral vascular accident) ([place]) 09/06/2019  . Anoxic brain injury ([place]) 09/06/2019  . MSSA bacteremia 09/06/2019  . Positive D dimer 09/06/2019  . Pseudomonas infection 09/06/2019  . Infection due to acinetobacter baumannii 09/06/2019  . Sinus tachycardia 09/05/2019  . Advanced care planning/counseling discussion   . Goals of care, counseling/discussion   . Palliative care by specialist   . Cerebral thrombosis with cerebral infarction 08/28/2019  . Tracheostomy status ([place])   . Acute respiratory distress syndrome (ARDS) due to COVID-19 virus ([place])   . Chest tube in place   . Primary spontaneous pneumothorax   . Acute respiratory failure ([place])   . Gastric ulcer with hemorrhage 08/10/2019  . Constipation 08/09/2019  . Atrial fibrillation with RVR ([place]) 08/07/2019  . Acute renal failure (ARF) ([place]) 08/06/2019  . GI bleed 08/06/2019  . Pneumothorax on right   . Fluid overload 07/27/2019  . Pneumonia due to COVID-19 virus 07/25/2019  . Acute respiratory failure due to COVID-19 ([place]) 07/25/2019  . AKI (acute kidney injury) ([place]) 07/25/2019  . Dyspepsia 11/26/2014  . BPH (benign prostatic hyperplasia) 10/31/2013  . Achilles rupture 04/05/2011  . S/P Achilles tendon repair 04/05/2011     Past Medical History:  Past Medical History:  Diagnosis Date  . COVID-19   . Heartburn    Past Surgical History:  Past Surgical History:  Procedure Laterality Date  . ACHILLES TENDON SURGERY Right 2012  . COLONOSCOPY  2009   [facility]  . ESOPHAGOGASTRODUODENOSCOPY N/A 12/25/2014   Procedure: ESOPHAGOGASTRODUODENOSCOPY (EGD);  Surgeon: Danie Binder, MD;  Location: [facility];  Service: Endoscopy;  Laterality: N/A;  830am  . ESOPHAGOGASTRODUODENOSCOPY N/A 08/09/2019   Procedure: ESOPHAGOGASTRODUODENOSCOPY (EGD);  Surgeon: Ronald Lobo, MD;  Location: [facility];  Service: Endoscopy;  Laterality: N/A;  Patient is already sedated, so I do not anticipate need for further sedation.  Okay from my standpoint to do either at the bedside or in the operating room  . HEMOSTASIS CLIP PLACEMENT  08/09/2019   Procedure: HEMOSTASIS CLIP PLACEMENT;  Surgeon: Ronald Lobo, MD;  Location: [facility];  Service: Endoscopy;;  . HEMOSTASIS CONTROL  08/09/2019   Procedure: HEMOSTASIS CONTROL;  Surgeon: Ronald Lobo, MD;  Location: [facility] END[facility];  Service: Endoscopy;;  epi   . IR FLUORO GUIDE CV LINE LEFT  09/02/2019  . IR US GUIDE VASC ACCESS LEFT  09/02/2019  . KNEE SURGERY Left 1980's  . none    . WRIST SURGERY Left 1970's    Assessment & Plan Clinical Impression:   [name] is a 68 year old right-handed male with history of GERD who presented to [facility] hospital 07/26/2019 with shortness of breath and hypoxia.  He was diagnosed with COVID-19 on September 20 of 2020.  Oxygen saturations in the 40th percentile range.  Placed on a nonrebreather mask.  Patient was given IV steroids and remdesivir that  he completed 07/30/2019.  He did require intubation for airway protection.  He had 1 unit of convalescent plasma and had received Decadron until 08/04/2019.  He was treated with vancomycin and cefepime through 08/02/2019.  Initial chest x-ray demonstrated diffuse multifocal infiltrates.  CT of  the chest negative for pulmonary emboli.  He was admitted to [facility] pH 7.41, PCO2 44 PO2 of 87 on 100% and AA gradient calculates out to 571.  Noted elevated creatinine of 1.51.  Renal service is consulted for elevated creatinine/AKI necessitating the need for hemodialysis 08/07/2019 but has since been deemed chronic Tuesday Thursday Saturday schedule.  Gastroenterology services consulted for ongoing melena drop in hemoglobin and patient was transfused 10 units of packed red blood cells total throughout hospital admission with latest hemoglobin 8.4.  An endoscopy was completed showing no active bleeding or blood in the stomach.  Very large gastric residual mostly tube feeding without anatomic gastric outlet obstruction suggestive gastric ileus.  Patient's hemoglobin hematocrit have stabilized.  Long-term intubation requiring tracheostomy tube 08/22/2019 patient is since been extubated.  Diet has been advanced to regular consistency.  Subcutaneous heparin for DVT prophylaxis.  Hospital course complicated by bouts of atrial fibrillation with RVR 08/09/2019 patient was initially placed on amiodarone.  Neurology service was consulted 08/27/2019 for altered mental status with MRI of the brain showing small acute to early subacute infarcts in bilateral cerebral cortex and right cerebellum and maintained on low-dose aspirin for CVA prophylaxis.. Patient with development of sacral decubitus wound care dressing changes as per wound care nurse as well as wound care to partial-thickness areas of tissue loss at posterior aspect of penile shaft again with dressing changes as directed.  Therapy evaluations completed patient was admitted for a comprehensive rehab program.  Patient currently requires total with basic self-care skills secondary to muscle weakness and muscle paralysis, decreased cardiorespiratoy endurance and decreased oxygen support, abnormal tone, unbalanced muscle activation, motor apraxia, ataxia,  decreased coordination and decreased motor planning, decreased midline orientation, decreased attention to left and left side neglect, decreased initiation, decreased attention, decreased problem solving, decreased safety awareness and decreased memory and decreased sitting balance, decreased postural control and hemiplegia.  Prior to hospitalization, patient could complete BADLs with independent .  Patient will benefit from skilled intervention to increase independence with basic self-care skills prior to discharge home with spouse.  Anticipate patient will require 24 hour supervision and minimal physical assistance and follow up home health.  OT - End of Session Endurance Deficit: Yes OT Assessment Rehab Potential (ACUTE ONLY): Good OT Barriers to Discharge: Medical stability;Home environment access/layout;Wound Care;Lack of/limited family support;Hemodialysis;New oxygen OT Patient demonstrates impairments in the following area(s): Balance;Perception;Safety;Cognition;Sensory;Skin Integrity;Endurance;Motor;Pain OT Basic ADL's Functional Problem(s): Eating;Grooming;Bathing;Toileting;Dressing OT Advanced ADL's Functional Problem(s): Simple Meal Preparation OT Transfers Functional Problem(s): Toilet;Tub/Shower OT Additional Impairment(s): Fuctional Use of Upper Extremity OT Plan OT Intensity: Minimum of 1-2 x/day, 45 to 90 minutes OT Frequency: 5 out of 7 days OT Duration/Estimated Length of Stay: 3.5-4 weeks OT Treatment/Interventions: Balance/vestibular training;Discharge planning;Functional electrical stimulation;Pain management;Self Care/advanced ADL retraining;Therapeutic Activities;UE/LE Coordination activities;Cognitive remediation/compensation;Disease mangement/prevention;Functional mobility training;Patient/family education;Skin care/wound managment;Therapeutic Exercise;Visual/perceptual remediation/compensation;Wheelchair propulsion/positioning;UE/LE Strength  taining/ROM;Splinting/orthotics;Psychosocial support;Neuromuscular re-education;DME/adaptive equipment instruction;Community reintegration OT Self Feeding Anticipated Outcome(s): Supervision/cuing OT Basic Self-Care Anticipated Outcome(s): Supervision-Min A OT Toileting Anticipated Outcome(s): Min A OT Bathroom Transfers Anticipated Outcome(s): Min A OT Recommendation Recommendations for Other Services: Therapeutic Recreation consult Patient destination: Home Follow Up Recommendations: Home health OT Equipment Recommended: To be determined   Skilled Therapeutic Intervention  Skilled OT session completed with focus on initial evaluation, education on OT role/POC, and establishment of patient centered goals.   Pt greeted in bed with RN present to change sacral dressing. He reported some pain in Lt leg and sacrum but stating it was "not bad" and manageable for tx. Started with eating breakfast, Total A of 1 for supine<sit. While EOB, pt required Max A for sitting balance with OT sitting behind him due to posterior LOBs. RN set pt up with his breakfast and he ate using the R UE. Note that pt has very little active movement in L UE and his affected limb was nonfunctional throughout tx. +2 assist for bathing/dressing tasks EOB. Pt required vcs for sequencing and attention to his Lt side. HOH for using Lt to wash Rt side. He was able to tolerate figure 4 position bilaterally for washing feet. After donning hospital gown, pt returned to supine. +2 for pericare with pt rolling Rt>Lt with Mod-Min A respectively. +2 for boosting up in bed. Left pt with all needs within reach and 4 bedrails up.   02 sats throughout tx 97-100% on .5L 02.    OT Evaluation Precautions/Restrictions  Precautions Precautions: Fall Precaution Comments: Sacral decubitus wounds, Lt hemi General Chart Reviewed: Yes Family/Caregiver Present: No Vital Signs Therapy Vitals Pulse Rate: 87 Resp: 16 Patient Position (if  appropriate): Lying Oxygen Therapy SpO2: 97 % O2 Device: Nasal Cannula O2 Flow Rate (L/min): 1 L/min Pain Pain Assessment Pain Scale: 0-10 Pain Score: 0-No pain Home Living/Prior Functioning Home Living Family/patient expects to be discharged to:: Private residence Living Arrangements: Spouse/significant other Available Help at Discharge: Family Type of Home: House Home Access: Stairs to enter Technical brewer of Steps: 3 Entrance Stairs-Rails: Right, Left Home Layout: One level Bathroom Shower/Tub: Chiropodist: Handicapped height Bathroom Accessibility: Yes  Lives With: Spouse IADL History Homemaking Responsibilities: Yes(Pt reported he used to take out the trash. 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 Leisure and Hobbies: Golfing Prior Function Level of Independence: Independent with basic ADLs, Independent with gait, Independent with homemaking with ambulation  Able to [place]?: Yes Driving: Yes Vocation: Retired Comments: Therapist, nutritional, retired 2 months  ADL ADL Eating: Maximal assistance(for sitting balance) Where Assessed-Eating: Edge of bed Grooming: Maximal assistance Where Assessed-Grooming: Edge of bed Upper Body Bathing: Maximal assistance Where Assessed-Upper Body Bathing: Edge of bed Lower Body Bathing: Dependent(+2 assist) Where Assessed-Lower Body Bathing: Bed level Upper Body Dressing: Dependent Where Assessed-Upper Body Dressing: Edge of bed Lower Body Dressing: Dependent(+2 assist) Where Assessed-Lower Body Dressing: Bed level Toileting: Not assessed Toilet Transfer: Not assessed Tub/Shower Transfer: Not assessed Vision Baseline Vision/History: Wears glasses Wears Glasses: At all times Patient Visual Report: Blurring of vision Alignment/Gaze Preference: Head  turned Tracking/Visual Pursuits: Decreased smoothness of horizontal tracking;Decreased smoothness of vertical tracking Saccades: Impaired - to be further tested in functional context Visual Fields: Impaired-to be further tested in functional context;Left visual field deficit Additional Comments: mild decreased attention to L visual field Perception  Perception: Impaired(Lt inattention) Inattention/Neglect: Does not attend to left visual field;Does not attend to left side of body Praxis Praxis: Impaired(motor planning, initiation) Praxis Impairment Details: Initiation Cognition Overall Cognitive Status: Impaired/Different from baseline Arousal/Alertness: Awake/alert Orientation Level: Person;Place(Able to state that he had CVAs but needed cuing to recall that he was a covid survivor) Year: 2020 Month: December Day of Week:  Incorrect Memory: Impaired Memory Impairment: Decreased short term memory Decreased Short Term Memory: Verbal basic Immediate Memory Recall: Sock;Blue;Bed Memory Recall Sock: With Cue Memory Recall Blue: Without Cue Memory Recall Bed: With Cue Awareness: Appears intact Problem Solving: Impaired Problem Solving Impairment: Functional complex Safety/Judgment: Impaired Comments: mild L side inattention resulting in poor postural control and sitting posiion while in WC when attempting perform pressure relief from sacrum Sensation Sensation Light Touch: Impaired by gross assessment Additional Comments: decreased appreciation to stimuli on the L Coordination Gross Motor Movements are Fluid and Coordinated: No Fine Motor Movements are Fluid and Coordinated: No (unable to call spouse on cell phone without assist due to ataxic Rt hand activity) Coordination and Movement Description: extreme debility from prolonged hospital stay as well as poor coordination from CVA affecting the LUE>LLE Finger Nose Finger Test: Mild ataxia with the Rt, unable to complete on the L Heel  Shin Test: limited in BLE L>R Motor  Motor Motor: Clonus;Hemiplegia;Motor apraxia;Abnormal tone;Abnormal postural alignment and control;Motor perseverations Motor - Skilled Clinical Observations: dense L hemiplegia and weakness from prolonged hospital stay Mobility  Bed Mobility Bed Mobility: Rolling Right;Rolling Left;Sit to Supine;Supine to Sit Rolling Right: Moderate Assistance - Patient 50-74%;Maximal Assistance - Patient 25-49% Rolling Left: Moderate Assistance - Patient 50-74% Supine to Sit: 2 Helpers Sit to Supine: 2 Helpers  Trunk/Postural Assessment  Cervical Assessment Cervical Assessment: Exceptions to WFL(limited on the L) Thoracic Assessment Thoracic Assessment: Exceptions to WFL(Lt lateral lean, rounded shoulders) Lumbar Assessment Lumbar Assessment: Exceptions to WFL(posterior pelvic tilt) Postural Control Postural Control: Deficits on evaluation(Pt losing balance posterioraly and towards during eating + UB self care EOB) Trunk Control: impaired on teh L Righting Reactions: Insufficient Protective Responses: delayed Postural Limitations: constant L lateral lean when in sitting, worse without trunk support  Balance Balance Balance Assessed: Yes Static Sitting Balance Static Sitting - Balance Support: Right upper extremity supported Static Sitting - Level of Assistance: 3: Mod assist Static Sitting - Comment/# of Minutes: sitting in WC to preform forward lean for sling placemen Dynamic Sitting Balance Dynamic Sitting - Balance Support: Left upper extremity supported(eating EOB) Dynamic Sitting - Level of Assistance: 2: Max assist Dynamic Sitting Balance - Compensations: LOB when attempted to adjust RUE support Extremity/Trunk Assessment RUE Assessment RUE Assessment: Within Functional Limits Active Range of Motion (AROM) Comments: Limited shoulder flexion and abduction <90 degrees General Strength Comments: 3-/5 grossly, mildly ataxic LUE Assessment LUE  Assessment: Exceptions to [facility] Passive Range of Motion (PROM) Comments: 45 degrees shoulder flexion, 90 degrees shoulder abduction General Strength Comments: Minimal activation in limb distal>proximal, nonfunctional     Refer to Care Plan for Long Term Goals  Recommendations for other services: Therapeutic Recreation  Pet therapy   Discharge Criteria: Patient will be discharged from OT if patient refuses treatment 3 consecutive times without medical reason, if treatment goals not met, if there is a change in medical status, if patient makes no progress towards goals or if patient is discharged from hospital.  The above assessment, treatment plan, treatment alternatives and goals were discussed and mutually agreed upon: by patient  Skeet Simmer 10/04/2019, 12:43 PM

## 2019-10-04 NOTE — Evaluation (Signed)
Physical Therapy Assessment and Plan  Patient Details  Name: [name] MRN: [id number] Date of Birth: December 16, 1950  PT Diagnosis: Abnormal posture, Abnormality of gait, Coordination disorder, Difficulty walking, Hemiplegia non-dominant, Impaired cognition, Impaired sensation and Muscle weakness Rehab Potential: Fair ELOS: 4 weeks   Today's Date: 10/04/2019 PT Individual Time: 1696-7893 PT Individual Time Calculation (min): 70 min    Problem List:  Patient Active Problem List   Diagnosis Date Noted  . Oropharyngeal dysphagia 10/03/2019  . Debility 10/03/2019  . Acute blood loss anemia 09/06/2019  . H. pylori infection 09/06/2019  . CVA (cerebral vascular accident) ([place]) 09/06/2019  . Anoxic brain injury ([place]) 09/06/2019  . MSSA bacteremia 09/06/2019  . Positive D dimer 09/06/2019  . Pseudomonas infection 09/06/2019  . Infection due to acinetobacter baumannii 09/06/2019  . Sinus tachycardia 09/05/2019  . Advanced care planning/counseling discussion   . Goals of care, counseling/discussion   . Palliative care by specialist   . Cerebral thrombosis with cerebral infarction 08/28/2019  . Tracheostomy status ([place])   . Acute respiratory distress syndrome (ARDS) due to COVID-19 virus ([place])   . Chest tube in place   . Primary spontaneous pneumothorax   . Acute respiratory failure ([place])   . Gastric ulcer with hemorrhage 08/10/2019  . Constipation 08/09/2019  . Atrial fibrillation with RVR ([place]) 08/07/2019  . Acute renal failure (ARF) ([place]) 08/06/2019  . GI bleed 08/06/2019  . Pneumothorax on right   . Fluid overload 07/27/2019  . Pneumonia due to COVID-19 virus 07/25/2019  . Acute respiratory failure due to COVID-19 ([place]) 07/25/2019  . AKI (acute kidney injury) ([place]) 07/25/2019  . Dyspepsia 11/26/2014  . BPH (benign prostatic hyperplasia) 10/31/2013  . Achilles rupture 04/05/2011  . S/P Achilles tendon repair 04/05/2011    Past Medical History:  Past Medical History:   Diagnosis Date  . COVID-19   . Heartburn    Past Surgical History:  Past Surgical History:  Procedure Laterality Date  . ACHILLES TENDON SURGERY Right 2012  . COLONOSCOPY  2009   [facility]  . ESOPHAGOGASTRODUODENOSCOPY N/A 12/25/2014   Procedure: ESOPHAGOGASTRODUODENOSCOPY (EGD);  Surgeon: Danie Binder, MD;  Location[facility];  Service: Endoscopy;  Laterality: N/A;  830am  . ESOPHAGOGASTRODUODENOSCOPY N/A 08/09/2019   Procedure: ESOPHAGOGASTRODUODENOSCOPY (EGD);  Surgeon: Ronald Lobo, MD;  Location: [facility];  Service: Endoscopy;  Laterality: N/A;  Patient is already sedated, so I do not anticipate need for further sedation.  Okay from my standpoint to do either at the bedside or in the operating room  . HEMOSTASIS CLIP PLACEMENT  08/09/2019   Procedure: HEMOSTASIS CLIP PLACEMENT;  Surgeon: Ronald Lobo, MD;  Location: [facility];  Service: Endoscopy;;  . HEMOSTASIS CONTROL  08/09/2019   Procedure: HEMOSTASIS CONTROL;  Surgeon: Ronald Lobo, MD;  Location: [facility] END[facility];  Service: Endoscopy;;  epi   . IR FLUORO GUIDE CV LINE LEFT  09/02/2019  . IR US GUIDE VASC ACCESS LEFT  09/02/2019  . KNEE SURGERY Left 1980's  . none    . WRIST SURGERY Left 1970's    Assessment & Plan Clinical Impression: Patient is a 68 year old right-handed male with history of GERD who presented to [facility] 07/26/2019 with shortness of breath and hypoxia.  He was diagnosed with COVID-19 on September 20 of 2020.  Oxygen saturations in the 40th percentile range.  Placed on a nonrebreather mask.  Patient was given IV steroids and remdesivir that he completed 07/30/2019.  He did require intubation for airway protection.  He had 1 unit of convalescent plasma and had received Decadron until 08/04/2019.  He was treated with vancomycin and cefepime through 08/02/2019.  Initial chest x-ray demonstrated diffuse multifocal infiltrates.  CT of the chest negative for pulmonary emboli.  He was admitted to  [facility] pH 7.41, PCO2 44 PO2 of 87 on 100% and AA gradient calculates out to 571.  Noted elevated creatinine of 1.51.  Renal service is consulted for elevated creatinine/AKI necessitating the need for hemodialysis 08/07/2019 but has since been deemed chronic Tuesday Thursday Saturday schedule.  Gastroenterology services consulted for ongoing melena drop in hemoglobin and patient was transfused 10 units of packed red blood cells total throughout hospital admission with latest hemoglobin 8.4.  An endoscopy was completed showing no active bleeding or blood in the stomach.  Very large gastric residual mostly tube feeding without anatomic gastric outlet obstruction suggestive gastric ileus.  Patient's hemoglobin hematocrit have stabilized.  Long-term intubation requiring tracheostomy tube 08/22/2019 patient is since been extubated.  Diet has been advanced to regular consistency.  Subcutaneous heparin for DVT prophylaxis.  Hospital course complicated by bouts of atrial fibrillation with RVR 08/09/2019 patient was initially placed on amiodarone.  Neurology service was consulted 08/27/2019 for altered mental status with MRI of the brain showing small acute to early subacute infarcts in bilateral cerebral cortex and right cerebellum and maintained on low-dose aspirin for CVA prophylaxi Patient transferred to [place] on 10/03/2019 .   Patient currently requires total with mobility secondary to muscle weakness, muscle joint tightness and muscle paralysis, decreased cardiorespiratoy endurance and decreased oxygen support, impaired timing and sequencing, abnormal tone, unbalanced muscle activation, motor apraxia and decreased coordination, decreased attention to left, decreased initiation, decreased awareness, decreased problem solving and decreased safety awareness and decreased sitting balance, decreased standing balance, decreased postural control, hemiplegia and decreased balance strategies.  Prior to  hospitalization, patient was independent  with mobility and lived with Spouse in a House home.  Home access is 3Stairs to enter.  Patient will benefit from skilled PT intervention to maximize safe functional mobility, minimize fall risk and decrease caregiver burden for planned discharge home with 24 hour assist.  Anticipate patient will benefit from follow up [facility] at discharge.  PT - End of Session Activity Tolerance: Tolerates < 10 min activity, no significant change in vital signs Endurance Deficit: Yes PT Assessment Rehab Potential (ACUTE/IP ONLY): Fair PT Barriers to Discharge: [place] home environment;Medical stability;Home environment access/layout;Incontinence;Wound Care;Insurance for SNF coverage;Hemodialysis PT Patient demonstrates impairments in the following area(s): Balance;Behavior;Edema;Endurance;Motor;Nutrition;Pain;Perception;Safety;Sensory;Skin Integrity PT Transfers Functional Problem(s): Bed Mobility;Bed to Chair;Car;Furniture;Floor PT Locomotion Functional Problem(s): Ambulation;Wheelchair Mobility;Stairs PT Plan PT Intensity: Minimum of 1-2 x/day ,45 to 90 minutes PT Frequency: 5 out of 7 days PT Duration Estimated Length of Stay: 4 weeks PT Treatment/Interventions: Ambulation/gait training;Discharge planning;Functional mobility training;Psychosocial support;Therapeutic Activities;Visual/perceptual remediation/compensation;Wheelchair propulsion/positioning;Therapeutic Exercise;Disease management/prevention;Balance/vestibular training;UE/LE Strength taining/ROM;Splinting/orthotics;Pain management;Neuromuscular re-education;Skin care/wound management;DME/adaptive equipment instruction;Cognitive remediation/compensation;Community reintegration;Patient/family education;Functional electrical stimulation;Stair training;UE/LE Coordination activities PT Transfers Anticipated Outcome(s): Min assist to and from [facility] with lateral scoot/squat pivot technique PT Locomotion Anticipated  Outcome(s): WC level with supervision assist. PT Recommendation Recommendations for Other Services: Neuropsych consult Follow Up Recommendations: Home health PT Patient destination: Home Equipment Recommended: Rolling walker with 5" wheels;Wheelchair (measurements);Wheelchair cushion (measurements);To be determined;Sliding board  Skilled Therapeutic Intervention  Pt received supine in bed and agreeable to PT. PT obtained maximove and TS WC with Roho hybrid cushion for pressure relief and improved sitting tolernace. PT instructed patient in PT Evaluation and initiated  treatment intervention; see below for results. PT educated patient in [place], rehab potential, rehab goals, and discharge recommendations.  Rolling in bed R and L with mod assist overall for moximove sling placement. Pt transferred to [facility] in [place]. Pt transported to rehab gym in [facility] sitting balance in [place] with mod-max assist to maintian midline and prevent L LOB and well as improve and sustain anteiror weight shift. Pt unable to perform sit<>stand with assist from PT or in bariatric stedy from [facility] due to poor LE strength and motor planning. Pt returned to room and performed maxi move transfer to bed . Pt  left supine in bed with call bell in reach and all needs met.     PT Evaluation Precautions/Restrictions Precautions Precautions: Fall Precaution Comments: Sacral decubitus wounds, Lt hemi General   Vital SignsTherapy Vitals Pulse Rate: 87 Resp: 16 Patient Position (if appropriate): Lying Oxygen Therapy SpO2: 97 % O2 Device: Nasal Cannula O2 Flow Rate (L/min): 1 L/min Pain Pain Assessment Pain Scale: 0-10 Pain Score: 0-No pain Home Living/Prior Functioning Home Living Available Help at Discharge: Family Type of Home: House Home Access: Stairs to enter Technical brewer of Steps: 3 Entrance Stairs-Rails: Right;Left Home Layout: One level Bathroom Shower/Tub: Chiropodist: Handicapped  height Bathroom Accessibility: Yes  Lives With: Spouse Prior Function Level of Independence: Independent with basic ADLs;Independent with gait;Independent with homemaking with ambulation  Able to Take Stairs?: Yes Driving: Yes Vocation: Retired Comments: Therapist, nutritional, retired 2 months  Vision/Perception  Vision - Assessment Alignment/Gaze Preference: Head turned Tracking/Visual Pursuits: Decreased smoothness of horizontal tracking;Decreased smoothness of vertical tracking Saccades: Impaired - to be further tested in functional context Additional Comments: mild decreased attention to L visual field Perception Perception: Impaired(Lt inattention) Inattention/Neglect: Does not attend to left visual field;Does not attend to left side of body Praxis Praxis: Impaired(motor planning, initiation) Praxis Impairment Details: Initiation  Cognition Overall Cognitive Status: Impaired/Different from baseline Arousal/Alertness: Awake/alert Orientation Level: Oriented X4 Memory: Impaired Memory Impairment: Decreased short term memory Decreased Short Term Memory: Verbal basic Immediate Memory Recall: Sock;Blue;Bed Memory Recall Sock: With Cue Memory Recall Blue: Without Cue Memory Recall Bed: With Cue Awareness: Appears intact Problem Solving: Impaired Problem Solving Impairment: Functional complex Safety/Judgment: Impaired Comments: mild L side inattention resulting in poor postural control and sitting posiion while in WC when attempting perform pressure relief from sacrum Sensation Sensation Light Touch: Impaired by gross assessment Additional Comments: decreased appreciation to stimuli on the L Coordination Gross Motor Movements are Fluid and Coordinated: No Fine Motor Movements are Fluid and Coordinated: No Coordination and Movement Description: extreme debility from prolonged hospital stay as well as poor coordination from CVA affecting the LUE>LLE Finger Nose Finger Test: Mild  ataxia with the Rt, unable to complete on the L Heel Shin Test: limited in BLE L>R Motor  Motor Motor: Clonus;Hemiplegia;Motor apraxia;Abnormal tone;Abnormal postural alignment and control;Motor perseverations Motor - Skilled Clinical Observations: dense L hemiplegia and weakness from prolonged hospital stay  Mobility Bed Mobility Bed Mobility: Rolling Right;Rolling Left;Sit to Supine;Supine to Sit Rolling Right: Moderate Assistance - Patient 50-74%;Maximal Assistance - Patient 25-49% Rolling Left: Moderate Assistance - Patient 50-74% Supine to Sit: 2 Helpers Sit to Supine: 2 Helpers Transfers Transfers: Transfer via Public house manager via Lift Equipment: Occupational psychologist Ambulation: No Gait Gait: No Stairs / Additional Locomotion Stairs: No Architect: Yes Wheelchair Assistance: Dependent - Patient 0% Wheelchair Propulsion: Other (comment) Distance: TIS WC 159f  Trunk/Postural Assessment  Cervical Assessment  Cervical Assessment: Exceptions to WFL(limited on the L) Thoracic Assessment Thoracic Assessment: Exceptions to WFL(Lt lateral lean, rounded shoulders) Lumbar Assessment Lumbar Assessment: Exceptions to WFL(posterior pelvic tilt) Postural Control Postural Control: Deficits on evaluation(Pt losing balance posterioraly and towards during eating + UB self care EOB) Trunk Control: impaired on teh L Righting Reactions: Insufficient Protective Responses: delayed Postural Limitations: constant L lateral lean when in sitting, worse without trunk support  Balance Balance Balance Assessed: Yes Static Sitting Balance Static Sitting - Balance Support: Right upper extremity supported Static Sitting - Level of Assistance: 3: Mod assist Static Sitting - Comment/# of Minutes: sitting in WC to preform forward lean for sling placemen Dynamic Sitting Balance Dynamic Sitting - Balance Support: Left upper extremity supported(eating  EOB) Dynamic Sitting - Level of Assistance: 2: Max assist Dynamic Sitting Balance - Compensations: LOB when attempted to adjust RUE support Extremity Assessment  RUE Assessment RUE Assessment: Within Functional Limits Active Range of Motion (AROM) Comments: Limited shoulder flexion and abduction <90 degrees General Strength Comments: 3-/5 grossly, mildly ataxic LUE Assessment LUE Assessment: Exceptions to [facility] Passive Range of Motion (PROM) Comments: 45 degrees shoulder flexion, 90 degrees shoulder abduction General Strength Comments: Minimal activation in limb distal>proximal, nonfunctional RLE Assessment RLE Assessment: Exceptions to [facility] General Strength Comments: grossly 3+/5 proximal to distal LLE Assessment LLE Assessment: Exceptions to [facility] General Strength Comments: grossly 2+/5 to 3-/5 proximal to distal with delayed activation    Refer to Care Plan for Long Term Goals  Recommendations for other services: Neuropsych  Discharge Criteria: Patient will be discharged from PT if patient refuses treatment 3 consecutive times without medical reason, if treatment goals not met, if there is a change in medical status, if patient makes no progress towards goals or if patient is discharged from hospital.  The above assessment, treatment plan, treatment alternatives and goals were discussed and mutually agreed upon: by patient  [name] [name] 10/04/2019, 12:41 PM

## 2019-10-04 NOTE — Evaluation (Addendum)
Speech Language Pathology Assessment and Plan  Patient Details  Name: [name] MRN: [id number] Date of Birth: 1950-11-12  SLP Diagnosis: Cognitive Impairments  Rehab Potential: Good ELOS: 21-28 days    Today's Date: 10/04/2019 SLP Individual Time: 0940-1030 SLP Individual Time Calculation (min): 50 min   Problem List:  Patient Active Problem List   Diagnosis Date Noted  . Oropharyngeal dysphagia 10/03/2019  . Debility 10/03/2019  . Acute blood loss anemia 09/06/2019  . H. pylori infection 09/06/2019  . CVA (cerebral vascular accident) ([place]) 09/06/2019  . Anoxic brain injury ([place]) 09/06/2019  . MSSA bacteremia 09/06/2019  . Positive D dimer 09/06/2019  . Pseudomonas infection 09/06/2019  . Infection due to acinetobacter baumannii 09/06/2019  . Sinus tachycardia 09/05/2019  . Advanced care planning/counseling discussion   . Goals of care, counseling/discussion   . Palliative care by specialist   . Cerebral thrombosis with cerebral infarction 08/28/2019  . Tracheostomy status ([place])   . Acute respiratory distress syndrome (ARDS) due to COVID-19 virus ([place])   . Chest tube in place   . Primary spontaneous pneumothorax   . Acute respiratory failure ([place])   . Gastric ulcer with hemorrhage 08/10/2019  . Constipation 08/09/2019  . Atrial fibrillation with RVR ([place]) 08/07/2019  . Acute renal failure (ARF) ([place]) 08/06/2019  . GI bleed 08/06/2019  . Pneumothorax on right   . Fluid overload 07/27/2019  . Pneumonia due to COVID-19 virus 07/25/2019  . Acute respiratory failure due to COVID-19 ([place]) 07/25/2019  . AKI (acute kidney injury) ([place]) 07/25/2019  . Dyspepsia 11/26/2014  . BPH (benign prostatic hyperplasia) 10/31/2013  . Achilles rupture 04/05/2011  . S/P Achilles tendon repair 04/05/2011   Past Medical History:  Past Medical History:  Diagnosis Date  . COVID-19   . Heartburn    Past Surgical History:  Past Surgical History:  Procedure Laterality Date  .  ACHILLES TENDON SURGERY Right 2012  . COLONOSCOPY  2009   [facility]  . ESOPHAGOGASTRODUODENOSCOPY N/A 12/25/2014   Procedure: ESOPHAGOGASTRODUODENOSCOPY (EGD);  Surgeon: Danie Binder, MD;  Location: [facility];  Service: Endoscopy;  Laterality: N/A;  830am  . ESOPHAGOGASTRODUODENOSCOPY N/A 08/09/2019   Procedure: ESOPHAGOGASTRODUODENOSCOPY (EGD);  Surgeon: Ronald Lobo, MD;  Location: [facility];  Service: Endoscopy;  Laterality: N/A;  Patient is already sedated, so I do not anticipate need for further sedation.  Okay from my standpoint to do either at the bedside or in the operating room  . HEMOSTASIS CLIP PLACEMENT  08/09/2019   Procedure: HEMOSTASIS CLIP PLACEMENT;  Surgeon: Ronald Lobo, MD;  Location: [facility];  Service: Endoscopy;;  . HEMOSTASIS CONTROL  08/09/2019   Procedure: HEMOSTASIS CONTROL;  Surgeon: Ronald Lobo, MD;  Location: [facility];  Service: Endoscopy;;  epi   . IR FLUORO GUIDE CV LINE LEFT  09/02/2019  . IR US GUIDE VASC ACCESS LEFT  09/02/2019  . KNEE SURGERY Left 1980's  . none    . WRIST SURGERY Left 1970's    Assessment / Plan / Recommendation Clinical Impression HPI: [name] is a 68 year old right-handed male with history of GERD who presented to [facility] 07/26/2019 with shortness of breath and hypoxia.  He was diagnosed with COVID-19 on September 20 of 2020.  Oxygen saturations in the 40th percentile range.  Placed on a nonrebreather mask.  Patient was given IV steroids and remdesivir that he completed 07/30/2019.  He did require intubation for airway protection.  He had 1 unit of convalescent plasma and had received  Decadron until 08/04/2019.  He was treated with vancomycin and cefepime through 08/02/2019.  Initial chest x-ray demonstrated diffuse multifocal infiltrates.  CT of the chest negative for pulmonary emboli.  He was admitted to [facility] pH 7.41, PCO2 44 PO2 of 87 on 100% and AA gradient calculates out to 571.  Noted  elevated creatinine of 1.51.  Renal service is consulted for elevated creatinine/AKI necessitating the need for hemodialysis 08/07/2019 but has since been deemed chronic Tuesday Thursday Saturday schedule.  Gastroenterology services consulted for ongoing melena drop in hemoglobin and patient was transfused 10 units of packed red blood cells total throughout hospital admission with latest hemoglobin 8.4.  An endoscopy was completed showing no active bleeding or blood in the stomach.  Very large gastric residual mostly tube feeding without anatomic gastric outlet obstruction suggestive gastric ileus.  Patient's hemoglobin hematocrit have stabilized.  Long-term intubation requiring tracheostomy tube 08/22/2019 patient is since been extubated.  Diet has been advanced to regular consistency.  Subcutaneous heparin for DVT prophylaxis.  Hospital course complicated by bouts of atrial fibrillation with RVR 08/09/2019 patient was initially placed on amiodarone.  Neurology service was consulted 08/27/2019 for altered mental status with MRI of the brain showing small acute to early subacute infarcts in bilateral cerebral cortex and right cerebellum and maintained on low-dose aspirin for CVA prophylaxis.. Patient with development of sacral decubitus wound care dressing changes as per wound care nurse as well as wound care to partial-thickness areas of tissue loss at posterior aspect of penile shaft again with dressing changes as directed.  Therapy evaluations completed patient was admitted for a comprehensive rehab program.  Patient presents with deficits in the area of short term memory. Patient participated in the [place] with the following scores: orientation 12/12 average range, attention 6/8 average range, registration 4 mild impairment, repetition 12/12 average range, naming 8/8 average range, memory 6/12 moderate impairment, calculations 4/4 average range, similarities 8/8 average range, judgement 6/6 average range.  Speech was 100% intelligible in conversation with no dysarthria or apraxia noted. Patient followed basic single and multi step directions without difficulty. Voice appears dysphonic question if this is secondary to prolonged intubation requiring tracheostomy during acute care stay as well as patient reports of excessive coughing while in acute care. Patient may benefit from an OP ENT consult at discharge if this does not improve.  Patient reports tolerating regular diet and thin liquids with no difficulty.  Patient would benefit from skilled SLP services during CIR stay targeting cognitive skills.   Skilled Therapeutic Interventions          Cognitive-linguistic evaluation and education  SLP Assessment  Patient will need skilled [facility] Services during CIR admission    Recommendations  Patient destination: Home Follow up Recommendations: Home Health SLP Equipment Recommended: None recommended by SLP    SLP Frequency 3 to 5 out of 7 days   SLP Duration  SLP Intensity  SLP Treatment/Interventions 21-28 days  Minumum of 1-2 x/day, 30 to 90 minutes  Cognitive remediation/compensation;Cueing hierarchy;Functional tasks;Therapeutic Exercise;Patient/family education;Medication managment    Pain Pain Assessment Pain Scale: 0-10 Pain Score: 0-No pain  Prior Functioning Cognitive/Linguistic Baseline: Within functional limits Type of Home: House  Lives With: Spouse Available Help at Discharge: Family Education: College Vocation: Retired  SLP Evaluation Cognition Overall Cognitive Status: Impaired/Different from baseline Arousal/Alertness: Awake/alert Orientation Level: Oriented X4 Memory: Impaired Memory Impairment: Decreased short term memory Decreased Short Term Memory: Verbal basic Awareness: Appears intact Problem Solving: Appears intact  Safety/Judgment: Appears intact  Comprehension Auditory Comprehension Overall Auditory Comprehension: Appears within  functional limits for tasks assessed Yes/No Questions: Within Functional Limits Commands: Within Functional Limits Expression Expression Primary Mode of Expression: Verbal Verbal Expression Overall Verbal Expression: Appears within functional limits for tasks assessed Initiation: No impairment Repetition: No impairment Naming: No impairment Pragmatics: No impairment Written Expression Written Expression: Not tested Oral Motor Motor Speech Articulation: Within functional limitis Intelligibility: Intelligible  Short Term Goals: Week 1: SLP Short Term Goal 1 (Week 1): Patient will complete higher level reasoning and executive functioning tasks (ex: med and money management) with 90% accuracy and min cues. SLP Short Term Goal 2 (Week 1): Patient will state and demonstrate compensatory cognitive-linguistic strategies as trained with 90% accuracy and min cues. SLP Short Term Goal 3 (Week 1): Patient will complete delayed recall tasks for 5 stimuil post ~5 minute delay with 80% accuracy and min cues.  Refer to Care Plan for Long Term Goals  Recommendations for other services: None   Discharge Criteria: Patient will be discharged from SLP if patient refuses treatment 3 consecutive times without medical reason, if treatment goals not met, if there is a change in medical status, if patient makes no progress towards goals or if patient is discharged from hospital.  The above assessment, treatment plan, treatment alternatives and goals were discussed and mutually agreed upon: by patient  Cristy Folks 10/04/2019, 12:21 PM

## 2019-10-04 NOTE — Progress Notes (Signed)
Initial Nutrition Assessment  DOCUMENTATION CODES:   Not applicable  INTERVENTION:   Nepro Shake po TID, each supplement provides 425 kcal and 19 grams protein  30 ml Prostat BID, each supplement provides 100 kcals and 15 grams protein.   Continue with liberalized REGULAR diet  Continue Renal MVI daily at bedtime   NUTRITION DIAGNOSIS:   Increased nutrient needs related to acute illness, wound healing as evidenced by estimated needs.  GOAL:   Patient will meet greater than or equal to 90% of their needs  MONITOR:   PO intake, Supplement acceptance, Labs, Weight trends, Skin  REASON FOR ASSESSMENT:   Malnutrition Screening Tool    ASSESSMENT:   68 yo male admitted to CIR on 12/11 after long hospitalization due to respiratory failure from COVID 19 pna, AKI requiring CRRT and ultimately iHD. PMH include COVID 19 infection, heart burn   RD working remotely.  Pt with lack of taste, taste changes post COVID infection. PO intake variable; recorded po intake 10-80% of meals.  Pt previously taking Nepro and Pro-Stat, plan to continue  Continues with scheduled iHD Noted corrected calcium high, not on calcium or Vit D, MD using low Ca bath during HD. iPTH pending  Noted weight of 74.1 on 12/10; weight on admit to CIR 85 kg. Current wt 85 kg. Unsure of dry weight   Labs: phosphorus 3.0 (no binder needed), corrected calcium 10.9 (H), albumin 2.0 Meds: Rena-Vit, Magic Mouthwash, carafate, aranesp  Diet Order:   Diet Order            Diet regular Room service appropriate? Yes; Fluid consistency: Thin  Diet effective now              EDUCATION NEEDS:   Not appropriate for education at this time  Skin:  Skin Assessment: Skin Integrity Issues: Skin Integrity Issues:: Unstageable Unstageable: sacrum  Last BM:  12/6  Height:   Ht Readings from Last 1 Encounters:  10/03/19 5\' 10"  (1.778 m)    Weight:   Wt Readings from Last 1 Encounters:  10/04/19 85 kg    BMI:  Body mass index is 26.89 kg/m.  Estimated Nutritional Needs:   Kcal:  2200-2400 kcals  Protein:  115-140 g  Fluid:  1000 mL plus UOP   [employer] MS, RDN, LDN, CNSC [phone] Pager  [phone] Weekend/On-Call Pager

## 2019-10-04 NOTE — Plan of Care (Signed)
  Problem: Consults Goal: RH STROKE PATIENT EDUCATION Description: See Patient Education module for education specifics  Outcome: Progressing Goal: Nutrition Consult-if indicated Outcome: Progressing   Problem: RH BOWEL ELIMINATION Goal: RH STG MANAGE BOWEL WITH ASSISTANCE Description: STG Manage Bowel with min/mod Assistance. Outcome: Progressing Goal: RH STG MANAGE BOWEL W/MEDICATION W/ASSISTANCE Description: STG Manage Bowel with Medication with mod I Assistance. Outcome: Progressing   Problem: RH BLADDER ELIMINATION Goal: RH STG MANAGE BLADDER WITH ASSISTANCE Description: STG Manage Bladder With min/mod Assistance Outcome: Progressing   Problem: RH SKIN INTEGRITY Goal: RH STG SKIN FREE OF INFECTION/BREAKDOWN Description: Patients skin will remain free from further infection or breakdown with mod assist. Outcome: Progressing Goal: RH STG MAINTAIN SKIN INTEGRITY WITH ASSISTANCE Description: STG Maintain Skin Integrity With mod Assistance. Outcome: Progressing Goal: RH STG ABLE TO PERFORM INCISION/WOUND CARE W/ASSISTANCE Description: STG Able To Perform Incision/Wound Care With total Assistance from caregiver. Outcome: Progressing   Problem: RH SAFETY Goal: RH STG ADHERE TO SAFETY PRECAUTIONS W/ASSISTANCE/DEVICE Description: STG Adhere to Safety Precautions With supervision Assistance/Device. Outcome: Progressing   Problem: RH PAIN MANAGEMENT Goal: RH STG PAIN MANAGED AT OR BELOW PT'S PAIN GOAL Description: < 4 Outcome: Progressing   Problem: RH KNOWLEDGE DEFICIT Goal: RH STG INCREASE KNOWLEDGE OF HYPERTENSION Description: Patient/caregiver will verbalize understanding of management of HTN including diet, medications, monitoring, exercise, and follow up appointments with min assist. Outcome: Progressing Goal: RH STG INCREASE KNOWLEGDE OF HYPERLIPIDEMIA Description: Patient/caregiver will verbalize understanding of management of HLD including diet, medications,  monitoring, exercise, and follow up appointments with min assist. Outcome: Progressing Goal: RH STG INCREASE KNOWLEDGE OF STROKE PROPHYLAXIS Description: Patient/caregiver will verbalize understanding of management of stroke prophylaxis including diet, medications, monitoring, exercise, and follow up appointments with min assist. Outcome: Progressing

## 2019-10-04 NOTE — Progress Notes (Signed)
[facility] PHYSICAL MEDICINE & REHABILITATION PROGRESS NOTE   Subjective/Complaints: Pt reports generalized pain, abdominal discomfort, sore throat  ROS: Patient denies fever, rash,  blurred vision, nausea, vomiting, diarrhea, cough, shortness of breath or chest pain, joint or back pain, headache, or mood change.   Objective:   No results found. Recent Labs    10/02/19 0802 10/03/19 0555  WBC 13.4* 11.3*  HGB 7.7* 8.4*  HCT 25.0* 27.5*  PLT 362 281   Recent Labs    10/02/19 0800  NA 133*  K 3.9  CL 95*  CO2 28  GLUCOSE 104*  BUN 33*  CREATININE 5.04*  CALCIUM 9.3    Intake/Output Summary (Last 24 hours) at 10/04/2019 1123 Last data filed at 10/04/2019 0945 Gross per 24 hour  Intake 657 ml  Output 500 ml  Net 157 ml     Physical Exam: Vital Signs Blood pressure (!) 154/84, pulse 87, temperature 98 F (36.7 C), temperature source Oral, resp. rate 16, height 5\' 10"  (1.778 m), weight 85 kg, SpO2 97 %. Constitutional: No distress . Vital signs reviewed. HEENT: EOMI, oral membranes moist, thrush on tongue and throat Neck: supple Cardiovascular: RRR without murmur. No JVD    Respiratory: CTA Bilaterally without wheezes or rales. Normal effort    GI: BS +, non-tender, sl distended.   Ext: no edema Skin: sacral wound with cavity, 1.5 cm diameter. browish drainage Musculoskeletal: 4/5 strength throughout--stble Psych: pleasant, normal affect Neurological: normal insight and awareness    Assessment/Plan: 1. Functional deficits secondary to debility after multiple medical which require 3+ hours per day of interdisciplinary therapy in a comprehensive inpatient rehab setting.  Physiatrist is providing close team supervision and 24 hour management of active medical problems listed below.  Physiatrist and rehab team continue to assess barriers to discharge/monitor patient progress toward functional and medical goals  Care Tool:  Bathing    Body parts bathed by  patient: Chest, Abdomen, Right upper leg, Left upper leg, Face   Body parts bathed by helper: Right arm, Left arm, Front perineal area, Buttocks, Right lower leg, Left lower leg     Bathing assist Assist Level: 2 Helpers     Upper Body Dressing/Undressing Upper body dressing   What is the patient wearing?: Hospital gown only    Upper body assist Assist Level: Total Assistance - Patient < 25%    Lower Body Dressing/Undressing Lower body dressing      What is the patient wearing?: Incontinence brief     Lower body assist Assist for lower body dressing: 2 Helpers     Toileting Toileting Toileting Activity did not occur (Clothing management and hygiene only): N/A (no void or bm)  Toileting assist       Transfers Chair/bed transfer  Transfers assist  Chair/bed transfer activity did not occur: Safety/medical concerns        Locomotion Ambulation   Ambulation assist              Walk 10 feet activity   Assist           Walk 50 feet activity   Assist           Walk 150 feet activity   Assist           Walk 10 feet on uneven surface  activity   Assist           Wheelchair     Assist  Wheelchair 50 feet with 2 turns activity    Assist            Wheelchair 150 feet activity     Assist          Blood pressure (!) 154/84, pulse 87, temperature 98 F (36.7 C), temperature source Oral, resp. rate 16, height 5\' 10"  (1.778 m), weight 85 kg, SpO2 97 %.  Medical Problem List and Plan: 1.  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             -patient may shower             -ELOS/Goals: modI PT, OT, SLP  -Patient is beginning CIR therapies today including PT, OT, and SLP  2.  Antithrombotics: -DVT/anticoagulation:  Subcutaneous heparin.  Check vascular study             -antiplatelet therapy: Aspirin 81 mg daily 3. Pain Management: Hycet as needed 4. Mood: Provide emotional support. Rozerem 8 mg nightly             -antipsychotic agents: N/A 5. Neuropsych: This patient is capable of making decisions on his own behalf.             SLP recommends additional diagnostics of higher level cognitive skills.  6. Skin/Wound Care: Wound care sacral decubitus with cavity.   -pack with 2x2 gauze, change daily to bid  -woc rn  -nutrition, turning, prevalon boots 7. Fluids/Electrolytes/Nutrition: Routine in and outs with follow-up chemistries 8.  Anemia/melena.  Follow-up CBC.  Continue Aranesp 9.  Tracheostomy 08/22/2019.  Decannulated.  Diet advanced to regular.  Check oxygen saturations every shift.  Continue nebulizers as directed 10.  MSSA bacteremia/pneumonia.  Antibiotic therapy completed 11.  Hyperlipidemia.  Lipitor 12. Oropharyngeal dysphagia: Appreciate speech therapy recommendations; regular diet with oral care BID  -MMW for thrush    LOS: 1 days A FACE TO FACE EVALUATION WAS PERFORMED  Meredith Staggers 10/04/2019, 11:23 AM

## 2019-10-04 NOTE — Progress Notes (Signed)
Inpatient Rehabilitation  Patient information reviewed and entered into eRehab system by Sherisse Fullilove M. Kannen Moxey, M.A., CCC/SLP, PPS Coordinator.  Information including medical coding, functional ability and quality indicators will be reviewed and updated through discharge.    

## 2019-10-04 NOTE — Progress Notes (Signed)
[facility] Progress Note   Dialysis Orders:  Dialysis dependent AKI  presumed progressed to  ESRD   - outpatient HD has been arranged at [facility] 12:30 pm after d/c pending no change  Assessment/Plan:  1.S/p acute hypoxemia and respiratory failure due to COVID 57:DUKGURKY complications/ secondary infections, prolonged ICU course. R chest tubeout,trach removed 11/30. Transferred to CIR 12/11 2. [facility] near 2 month acute admission for COVID/PNA/hypoixc resp failure now on TTSschedule -Admitted 10/3 and started CRRT on 08/07/19, has been on RRT for a little under 2 mos now. Patient prefers to avoid perm access for now hoping that renal function will recover. outpatient HD set up at [facility]. Foley out - discussed collecting all UOP via urinal. 3.HTN/volume:BPvariable- follow with HD 4. Anemia:Hgb7.7> 8.4.HxGI bleed this admission. No heparin with HD.S/pIV iron. Continue Aranesp141mcg q Sat.- tsat 17% 11/29 5. Secondary hyperparathyroidism:CorrCa slightly high. Not on calcium or vit D, use low Ca bath. Phos nowatgoal.Check iPTH with HD.today 6.HCW:CBJSEGBTDVVOH cortex and right cerebellum small acute to subacuteinfarctswithneuromuscular weakness/encephalopathy. 7. Nutrition: Alb very low - per nursing note, pt reports lack on taste (post-COVID). Addednepro + pro-stat supplements. Diet liberalized 8.Sacral decub: Pressure ulcer - wound care following. 9. Debility - per CIR 10. Code status DNR  Myriam Jacobson, PA-C [facility] Beeper [phone] 10/04/2019,10:30 AM  LOS: 1 day   Subjective:   Dislikes dialysis because it takes so long.  Discussed options for what he would do to occupy his time on HD.   Objective Vitals:   10/03/19 1933 10/03/19 2157 10/04/19 0502 10/04/19 0846  BP: (!) 161/79  (!) 154/84   Pulse: 65  87 87  Resp: 18  16 16   Temp: 98.4 F (36.9 C)  98 F (36.7 C)   TempSrc: Oral  Oral    SpO2: 99% 100% 98% 97%  Weight:   85 kg   Height:       Physical Exam General: Frail male  Heart: RRR Lungs: no rales Abdomen: soft NT Extremities: no LE edema Dialysis Access: left IJ [facility]   Additional Objective Labs: Basic Metabolic Panel: Recent Labs  Lab 09/30/19 0800 10/02/19 0800  NA 133* 133*  K 3.9 3.9  CL 95* 95*  CO2 26 28  GLUCOSE 93 104*  BUN 36* 33*  CREATININE 6.03* 5.04*  CALCIUM 9.4 9.3  PHOS 4.2 3.0   Liver Function Tests: Recent Labs  Lab 09/30/19 0800 10/02/19 0800  ALBUMIN 2.2* 2.0*   No results for input(s): LIPASE, AMYLASE in the last 168 hours. CBC: Recent Labs  Lab 09/29/19 0545 10/01/19 0511 10/02/19 0802 10/03/19 0555  WBC 13.8* 13.6* 13.4* 11.3*  HGB 7.8* 8.3* 7.7* 8.4*  HCT 24.7* 26.9* 25.0* 27.5*  MCV 90.5 91.8 92.6 92.6  PLT 408* 350 362 281   Blood Culture    Component Value Date/Time   SDES BLOOD LEFT HAND 09/22/2019 0917   SPECREQUEST AEROBIC BOTTLE ONLY Blood Culture adequate volume 09/22/2019 0917   CULT  09/22/2019 0917    NO GROWTH 5 DAYS Performed at [facility], [place] [address], [place], [place] [address]    REPTSTATUS 09/27/2019 FINAL 09/22/2019 8546    Cardiac Enzymes: No results for input(s): CKTOTAL, CKMB, CKMBINDEX, TROPONINI in the last 168 hours. CBG: Recent Labs  Lab 10/02/19 1142 10/02/19 1719 10/02/19 2105 10/03/19 0715 10/03/19 1122  GLUCAP 92 86 125* 79 95   Iron Studies: No results for input(s): IRON, TIBC, TRANSFERRIN, FERRITIN in the last  72 hours. Lab Results  Component Value Date   INR 1.2 09/02/2019   INR 1.0 08/22/2019   Studies/Results: No results found. Medications:  . aspirin EC  81 mg Oral Daily  . atorvastatin  40 mg Oral q1800  . budesonide (PULMICORT) nebulizer solution  0.5 mg Nebulization BID  . chlorhexidine  15 mL Mouth/Throat BID  . Chlorhexidine Gluconate Cloth  6 each Topical Daily  . darbepoetin (ARANESP) injection - DIALYSIS  150 mcg Intravenous Q  Sat-HD  . feeding supplement (NEPRO CARB STEADY)  237 mL Oral TID BM  . feeding supplement (PRO-STAT SUGAR FREE 64)  30 mL Oral BID  . heparin  5,000 Units Subcutaneous Q8H  . multivitamin  1 tablet Oral QHS  . pantoprazole  40 mg Oral Q1200  . ramelteon  8 mg Oral QHS  . sucralfate  1 g Oral Q6H

## 2019-10-05 ENCOUNTER — Inpatient Hospital Stay (HOSPITAL_COMMUNITY): Payer: Medicare HMO

## 2019-10-05 DIAGNOSIS — M7989 Other specified soft tissue disorders: Secondary | ICD-10-CM

## 2019-10-05 NOTE — Progress Notes (Signed)
[place] PHYSICAL MEDICINE & REHABILITATION PROGRESS NOTE   Subjective/Complaints: Backside is tender but no worse. Able to sleep. Therapy went ok  ROS: Patient denies fever, rash, sore throat, blurred vision, nausea, vomiting, diarrhea, cough, shortness of breath or chest pain, joint or back pain, headache, or mood change.    Objective:   No results found. Recent Labs    10/03/19 0555  WBC 11.3*  HGB 8.4*  HCT 27.5*  PLT 281   No results for input(s): NA, K, CL, CO2, GLUCOSE, BUN, CREATININE, CALCIUM in the last 72 hours.  Intake/Output Summary (Last 24 hours) at 10/05/2019 0942 Last data filed at 10/04/2019 2115 Gross per 24 hour  Intake 480 ml  Output 1100 ml  Net -620 ml     Physical Exam: Vital Signs Blood pressure (!) 141/86, pulse 97, temperature 98.7 F (37.1 C), temperature source Oral, resp. rate 16, height 5\' 10"  (1.778 m), weight 83 kg, SpO2 99 %. Constitutional: No distress . Vital signs reviewed. HEENT: EOMI, oral membranes moist, thrush Neck: supple Cardiovascular: RRR without murmur. No JVD    Respiratory: CTA Bilaterally without wheezes or rales. Normal effort    GI: BS +, non-tender, non-distended  Ext: no edema Skin: sacral wound with cavity, 1.5 cm diameter---tunnels deep . brownish drainage Musculoskeletal: 4/5 strength throughout--stable Psych: pleasant, normal affect Neurological: normal insight and awareness    Assessment/Plan: 1. Functional deficits secondary to debility after multiple medical which require 3+ hours per day of interdisciplinary therapy in a comprehensive inpatient rehab setting.  Physiatrist is providing close team supervision and 24 hour management of active medical problems listed below.  Physiatrist and rehab team continue to assess barriers to discharge/monitor patient progress toward functional and medical goals  Care Tool:  Bathing    Body parts bathed by patient: Chest, Abdomen, Right upper leg, Left upper  leg, Face   Body parts bathed by helper: Right arm, Left arm, Front perineal area, Buttocks, Right lower leg, Left lower leg     Bathing assist Assist Level: 2 Helpers     Upper Body Dressing/Undressing Upper body dressing   What is the patient wearing?: Hospital gown only    Upper body assist Assist Level: Total Assistance - Patient < 25%    Lower Body Dressing/Undressing Lower body dressing      What is the patient wearing?: Incontinence brief     Lower body assist Assist for lower body dressing: 2 Helpers     Toileting Toileting Toileting Activity did not occur (Clothing management and hygiene only): N/A (no void or bm)  Toileting assist Assist for toileting: Set up assist Assistive Device Comment: urinal    Transfers Chair/bed transfer  Transfers assist  Chair/bed transfer activity did not occur: Safety/medical concerns  Chair/bed transfer assist level: Dependent - mechanical lift     Locomotion Ambulation   Ambulation assist   Ambulation activity did not occur: Safety/medical concerns          Walk 10 feet activity   Assist  Walk 10 feet activity did not occur: Safety/medical concerns        Walk 50 feet activity   Assist Walk 50 feet with 2 turns activity did not occur: Safety/medical concerns         Walk 150 feet activity   Assist Walk 150 feet activity did not occur: Safety/medical concerns         Walk 10 feet on uneven surface  activity   Assist Walk 10 feet on  uneven surfaces activity did not occur: Safety/medical concerns         Wheelchair     Assist   Type of Wheelchair: Manual    Wheelchair assist level: Dependent - Patient 0%(TIS WC) Max wheelchair distance: 150    Wheelchair 50 feet with 2 turns activity    Assist        Assist Level: Dependent - Patient 0%   Wheelchair 150 feet activity     Assist      Assist Level: Dependent - Patient 0%   Blood pressure (!) 141/86, pulse 97,  temperature 98.7 F (37.1 C), temperature source Oral, resp. rate 16, height 5\' 10"  (1.778 m), weight 83 kg, SpO2 99 %.  Medical Problem List and Plan: 1.  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             -patient may shower             -ELOS/Goals: modI PT, OT, SLP  -Patient is beginning CIR therapies today including PT, OT, and SLP  2.  Antithrombotics: -DVT/anticoagulation: Subcutaneous heparin.  Check vascular study             -antiplatelet therapy: Aspirin 81 mg daily 3. Pain Management: Hycet as needed 4. Mood: Provide emotional support. Rozerem 8 mg nightly             -antipsychotic agents: N/A 5. Neuropsych: This patient is capable of making decisions on his own behalf.             SLP recommends additional diagnostics of higher level cognitive skills.  6. Skin/Wound Care: Wound care sacral decubitus with tunneling.   -pack with iodoform gauze bid  -woc rn consulted  -nutrition, turning, prevalon boots for heels 7. Fluids/Electrolytes/Nutrition: Routine in and outs with follow-up chemistries 8.  Anemia/melena.  Follow-up CBC.  Continue Aranesp 9.  Tracheostomy 08/22/2019.  Decannulated.  Diet advanced to regular.  Continue nebulizers as directed 10.  MSSA bacteremia/pneumonia.  Antibiotic therapy completed 11.  Hyperlipidemia.  Lipitor 12. Oropharyngeal dysphagia: Appreciate speech therapy recommendations; regular diet with oral care BID  -MMW for thrush    LOS: 2 days A FACE TO FACE EVALUATION WAS PERFORMED  Meredith Staggers 10/05/2019, 9:42 AM

## 2019-10-05 NOTE — Progress Notes (Signed)
VASCULAR LAB PRELIMINARY  PRELIMINARY  PRELIMINARY  PRELIMINARY  Bilateral lower extremity venous duplex completed.    Preliminary report:  See CV proc for preliminary results.  Teria Khachatryan, RVT 10/05/2019, 5:26 PM

## 2019-10-05 NOTE — Progress Notes (Addendum)
[facility] Progress Note   Dialysis Orders:  Dialysis dependent AKI  presumed progressed to  ESRD   - outpatient HD has been arranged at [facility] [place] 12:30 pm after d/c pending no change  Assessment/Plan:  1.S/p acute hypoxemia and respiratory failure due to COVID 99:IPJASNKN complications/ secondary infections, prolonged ICU course. R chest tubeout,trach removed 11/30. Transferred to CIR 12/11 2. [place] near 2 month acute admission for COVID/PNA/hypoixc resp failure now on TTSschedule -Admitted 10/3 and started CRRT on 08/07/19, has been on RRT for a little under 2 mos now. Patient prefers to avoid perm access for now hoping that renal function will recover. outpatient HD [facility]. Foley out -not sure why labs not drawn yesterday - will check pre HD Tuesday 3.HTN/volume:BPvariable- net UF 1 L 12/12  4. Anemia:Hgb7.7> 8.4.HxGI bleed this admission. No heparin with HD.S/pIV iron. Continue Aranesp161mcg q Sat.- tsat 17% 11/29 - on carafate 5. Secondary hyperparathyroidism:CorrCa slightly high. Not on calcium or vit D, use low Ca bath. Phos nowatgoal.iPTH ordered - not checked with Sat HD - need to check Tuesday 6.LZJ:QBHALPFXTKWIO cortex and right cerebellum small acute to subacuteinfarctswithneuromuscular weakness/encephalopathy. 7. Nutrition: Alb very low - per nursing note, pt reports lack on taste (post-COVID). Addednepro + pro-stat supplements. Diet liberalized 8.Sacral decub: Pressure ulcer - wound care following. 9. Debility - per CIR 10. Code status DNR  Myriam Jacobson, PA-C [facility] Beeper [phone] 10/05/2019,10:57 AM  LOS: 2 days   Pt seen, examined and agree w A/P as above.  [name]  MD 10/05/2019, 2:18 PM    Subjective:   Very sleepy this am - poorly rousable. Objective Vitals:   10/04/19 1929 10/04/19 2008 10/05/19 0454 10/05/19 0958  BP:  135/83 (!) 141/86   Pulse:  92  97 89  Resp:  18 16 16   Temp:  98.4 F (36.9 C) 98.7 F (37.1 C)   TempSrc:  Oral Oral   SpO2: 98% 100% 99% 93%  Weight:   83 kg   Height:       Physical Exam General: Frail male very drowsy Heart: RRR Lungs: no rales Abdomen: soft NT Extremities: no LE edema Dialysis Access: left IJ [facility]   Additional Objective Labs: Basic Metabolic Panel: Recent Labs  Lab 09/30/19 0800 10/02/19 0800  NA 133* 133*  K 3.9 3.9  CL 95* 95*  CO2 26 28  GLUCOSE 93 104*  BUN 36* 33*  CREATININE 6.03* 5.04*  CALCIUM 9.4 9.3  PHOS 4.2 3.0   Liver Function Tests: Recent Labs  Lab 09/30/19 0800 10/02/19 0800  ALBUMIN 2.2* 2.0*   No results for input(s): LIPASE, AMYLASE in the last 168 hours. CBC: Recent Labs  Lab 09/29/19 0545 10/01/19 0511 10/02/19 0802 10/03/19 0555  WBC 13.8* 13.6* 13.4* 11.3*  HGB 7.8* 8.3* 7.7* 8.4*  HCT 24.7* 26.9* 25.0* 27.5*  MCV 90.5 91.8 92.6 92.6  PLT 408* 350 362 281   Blood Culture    Component Value Date/Time   SDES BLOOD LEFT HAND 09/22/2019 0917   SPECREQUEST AEROBIC BOTTLE ONLY Blood Culture adequate volume 09/22/2019 0917   CULT  09/22/2019 0917    NO GROWTH 5 DAYS Performed at [facility], [place] [address], [place], [place] [address]    REPTSTATUS 09/27/2019 FINAL 09/22/2019 8341    Cardiac Enzymes: No results for input(s): CKTOTAL, CKMB, CKMBINDEX, TROPONINI in the last 168 hours. CBG: Recent Labs  Lab 10/02/19 1142 10/02/19 1719 10/02/19 2105 10/03/19  0715 10/03/19 1122  GLUCAP 92 86 125* 79 95   Iron Studies: No results for input(s): IRON, TIBC, TRANSFERRIN, FERRITIN in the last 72 hours. Lab Results  Component Value Date   INR 1.2 09/02/2019   INR 1.0 08/22/2019   Studies/Results: No results found. Medications:  . aspirin EC  81 mg Oral Daily  . atorvastatin  40 mg Oral q1800  . budesonide (PULMICORT) nebulizer solution  0.5 mg Nebulization BID  . chlorhexidine  15 mL Mouth/Throat BID  . Chlorhexidine  Gluconate Cloth  6 each Topical Daily  . darbepoetin (ARANESP) injection - DIALYSIS  150 mcg Intravenous Q Sat-HD  . feeding supplement (NEPRO CARB STEADY)  237 mL Oral TID BM  . feeding supplement (PRO-STAT SUGAR FREE 64)  30 mL Oral BID  . heparin  5,000 Units Subcutaneous Q8H  . magic mouthwash w/lidocaine  5 mL Oral QID  . multivitamin  1 tablet Oral QHS  . pantoprazole  40 mg Oral Q1200  . ramelteon  8 mg Oral QHS  . sucralfate  1 g Oral Q6H

## 2019-10-05 NOTE — Plan of Care (Signed)
  Problem: Consults Goal: RH STROKE PATIENT EDUCATION Description: See Patient Education module for education specifics  Outcome: Progressing Goal: Nutrition Consult-if indicated Outcome: Progressing   Problem: RH BOWEL ELIMINATION Goal: RH STG MANAGE BOWEL WITH ASSISTANCE Description: STG Manage Bowel with min/mod Assistance. Outcome: Progressing Goal: RH STG MANAGE BOWEL W/MEDICATION W/ASSISTANCE Description: STG Manage Bowel with Medication with mod I Assistance. Outcome: Progressing   Problem: RH BLADDER ELIMINATION Goal: RH STG MANAGE BLADDER WITH ASSISTANCE Description: STG Manage Bladder With min/mod Assistance Outcome: Progressing   Problem: RH SKIN INTEGRITY Goal: RH STG SKIN FREE OF INFECTION/BREAKDOWN Description: Patients skin will remain free from further infection or breakdown with mod assist. Outcome: Progressing Goal: RH STG MAINTAIN SKIN INTEGRITY WITH ASSISTANCE Description: STG Maintain Skin Integrity With mod Assistance. Outcome: Progressing Goal: RH STG ABLE TO PERFORM INCISION/WOUND CARE W/ASSISTANCE Description: STG Able To Perform Incision/Wound Care With total Assistance from caregiver. Outcome: Progressing   Problem: RH SAFETY Goal: RH STG ADHERE TO SAFETY PRECAUTIONS W/ASSISTANCE/DEVICE Description: STG Adhere to Safety Precautions With supervision Assistance/Device. Outcome: Progressing   Problem: RH PAIN MANAGEMENT Goal: RH STG PAIN MANAGED AT OR BELOW PT'S PAIN GOAL Description: < 4 Outcome: Progressing   Problem: RH KNOWLEDGE DEFICIT Goal: RH STG INCREASE KNOWLEDGE OF HYPERTENSION Description: Patient/caregiver will verbalize understanding of management of HTN including diet, medications, monitoring, exercise, and follow up appointments with min assist. Outcome: Progressing Goal: RH STG INCREASE KNOWLEGDE OF HYPERLIPIDEMIA Description: Patient/caregiver will verbalize understanding of management of HLD including diet, medications,  monitoring, exercise, and follow up appointments with min assist. Outcome: Progressing Goal: RH STG INCREASE KNOWLEDGE OF STROKE PROPHYLAXIS Description: Patient/caregiver will verbalize understanding of management of stroke prophylaxis including diet, medications, monitoring, exercise, and follow up appointments with min assist. Outcome: Progressing

## 2019-10-06 ENCOUNTER — Inpatient Hospital Stay (HOSPITAL_COMMUNITY): Payer: Medicare HMO | Admitting: Occupational Therapy

## 2019-10-06 ENCOUNTER — Inpatient Hospital Stay (HOSPITAL_COMMUNITY): Payer: Medicare HMO

## 2019-10-06 ENCOUNTER — Inpatient Hospital Stay (HOSPITAL_COMMUNITY): Payer: Medicare HMO | Admitting: Physical Therapy

## 2019-10-06 DIAGNOSIS — R5381 Other malaise: Principal | ICD-10-CM

## 2019-10-06 LAB — CBC
HCT: 25.7 % — ABNORMAL LOW (ref 39.0–52.0)
Hemoglobin: 7.6 g/dL — ABNORMAL LOW (ref 13.0–17.0)
MCH: 27.5 pg (ref 26.0–34.0)
MCHC: 29.6 g/dL — ABNORMAL LOW (ref 30.0–36.0)
MCV: 93.1 fL (ref 80.0–100.0)
Platelets: 307 10*3/uL (ref 150–400)
RBC: 2.76 MIL/uL — ABNORMAL LOW (ref 4.22–5.81)
RDW: 15.6 % — ABNORMAL HIGH (ref 11.5–15.5)
WBC: 11 10*3/uL — ABNORMAL HIGH (ref 4.0–10.5)
nRBC: 0.3 % — ABNORMAL HIGH (ref 0.0–0.2)

## 2019-10-06 LAB — BASIC METABOLIC PANEL
Anion gap: 8 (ref 5–15)
BUN: 33 mg/dL — ABNORMAL HIGH (ref 8–23)
CO2: 28 mmol/L (ref 22–32)
Calcium: 9.6 mg/dL (ref 8.9–10.3)
Chloride: 100 mmol/L (ref 98–111)
Creatinine, Ser: 4.55 mg/dL — ABNORMAL HIGH (ref 0.61–1.24)
GFR calc Af Amer: 14 mL/min — ABNORMAL LOW (ref >60)
GFR calc non Af Amer: 12 mL/min — ABNORMAL LOW (ref >60)
Glucose, Bld: 87 mg/dL (ref 70–99)
Potassium: 3.5 mmol/L (ref 3.5–5.1)
Sodium: 136 mmol/L (ref 135–145)

## 2019-10-06 MED ORDER — CHLORHEXIDINE GLUCONATE CLOTH 2 % EX PADS
6.0000 | MEDICATED_PAD | Freq: Every day | CUTANEOUS | Status: DC
  Administered 2019-10-07: 6 via TOPICAL

## 2019-10-06 NOTE — Progress Notes (Signed)
Physical Therapy Session Note  Patient Details  Name: [name] MRN: [id number] Date of Birth: 03/20/1951  Today's Date: 10/06/2019 PT Individual Time: 0805-0905 PT Individual Time Calculation (min): 60 min   Short Term Goals: Week 1:  PT Short Term Goal 1 (Week 1): Pt will transfer to and from [facility] with max assist +2 and LRAD PT Short Term Goal 2 (Week 1): Pt will perform bed mobility with max assist of 1 PT Short Term Goal 3 (Week 1): Pt will maintain sitting balance for up to 5 mintues with mod  assist PT Short Term Goal 4 (Week 1): PT will perform sit<>stand with max assist +2 for BLE strengthening PT Short Term Goal 5 (Week 1): Pt will tolerate 5 min on tilt table for functional WB through BLE  Skilled Therapeutic Interventions/Progress Updates: Pt presented in bed completing breakfast and agreeable to therapy. Pt denies pain during tx session. Pt stating wet brief thus performed rolling L/R with maxA to R min/modA to L for changing brief and peri-care. Pt agreeable to maxi-sky transfer to TIS, as pt continued to perform L/R rolling in same manner as above Wound care nurse arrived to perform assessment to sacral wound. Due to repeated rolling, pt demonstrated improved technique to L with PTA providing cues for bending R knee to facilitate rolling.  Once completed pt performed additional rolling for sling placement. Pt then transferred to [facility] chair via [facility]. Pt agreeable to stay in TIS until after speech therapy session. Pt left with belt alarm on, call bell within reach, and pillow placed under RUE with Respiratory therapy present to administer breathing treatment.      Therapy Documentation Precautions:  Precautions Precautions: Fall Precaution Comments: Sacral decubitus wounds, Lt hemi Restrictions Weight Bearing Restrictions: No General:   Vital Signs:   Pain: Pain Assessment Pain Score: 0-No pain   Therapy/Group: Individual Therapy  Modest Draeger  Jagar Lua, PTA  10/06/2019, 4:04 PM

## 2019-10-06 NOTE — Progress Notes (Signed)
Speech Language Pathology Daily Session Note  Patient Details  Name: [name] MRN: [id number] Date of Birth: 10-21-1951  Today's Date: 10/06/2019 SLP Individual Time: 1000-1058 SLP Individual Time Calculation (min): 58 min  Short Term Goals: Week 1: SLP Short Term Goal 1 (Week 1): Patient will complete higher level reasoning and executive functioning tasks (ex: med and money management) with 90% accuracy and min cues. SLP Short Term Goal 2 (Week 1): Patient will state and demonstrate compensatory cognitive-linguistic strategies as trained with 90% accuracy and min cues. SLP Short Term Goal 3 (Week 1): Patient will complete delayed recall tasks for 5 stimuil post ~5 minute delay with 80% accuracy and min cues.  Skilled Therapeutic Interventions: Skilled ST services focused on cognitive skills. SLP facilitated semi-complex problem solving skills, utilizing medication management QID pill organizer (although in this session only completed BID medication), pt required min A verbal cues for verbal problem solving (times per day) and functional problem solving mod A fade to min A verbal cues for errors. Pt demonstrated difficulty tracking while reading medication list and identifying errors during pill box task. Pt stated wife managed money at home, therefore recommend SLP target medication management and other executive function skills. Pt was left in room with call bell within reach and chair alarm set. ST recommends to continue skilled ST services.      Pain Pain Assessment Pain Score: 0-No pain  Therapy/Group: Individual Therapy  [name]  [facility] 10/06/2019, 3:27 PM

## 2019-10-06 NOTE — Consult Note (Signed)
[facility] Nurse Consult Note: Reason for Consult: coccyx wound  Wound type: unclear initial presentation; [place] nurse team has not consulted previously Stage 3 Pressure injury Pressure Injury POA: Yes Measurement:1.3cm x 0.7cm x 1cm with 5cm tunneling to towards 12 o'clock  Wound bed: unable to visualize; however base at the distal aspect I can see is pink Drainage (amount, consistency, odor) yellow, no odor Periwound: intact, epibole of wound edges which would be related to chronic wound Dressing procedure/placement/frequency: Change packing daily with silver hydrofiber (Aquacel Ag+). Cover with silicone foam.   Cushion in place for offloading when up in the chair/PT aware of needs.  Nutrition for wound healing.   Discussed POC with patient and bedside nurse.  Re consult if needed, will not follow at this time. Thanks  Dameka Younker [employer], RN,CWOCN, CNS, [place] [phone])

## 2019-10-06 NOTE — Progress Notes (Signed)
Social Work Assessment and Plan   Patient Details  Name: [name] MRN: [id number] Date of Birth: Aug 29, 1951  Today's Date: 10/06/2019  Problem List:  Patient Active Problem List   Diagnosis Date Noted  . Oropharyngeal dysphagia 10/03/2019  . Debility 10/03/2019  . Acute blood loss anemia 09/06/2019  . H. pylori infection 09/06/2019  . CVA (cerebral vascular accident) ([place]) 09/06/2019  . Anoxic brain injury ([place]) 09/06/2019  . MSSA bacteremia 09/06/2019  . Positive D dimer 09/06/2019  . Pseudomonas infection 09/06/2019  . Infection due to acinetobacter baumannii 09/06/2019  . Sinus tachycardia 09/05/2019  . Advanced care planning/counseling discussion   . Goals of care, counseling/discussion   . Palliative care by specialist   . Cerebral thrombosis with cerebral infarction 08/28/2019  . Tracheostomy status ([place])   . Acute respiratory distress syndrome (ARDS) due to COVID-19 virus ([place])   . Chest tube in place   . Primary spontaneous pneumothorax   . Acute respiratory failure ([place])   . Gastric ulcer with hemorrhage 08/10/2019  . Constipation 08/09/2019  . Atrial fibrillation with RVR ([place]) 08/07/2019  . Acute renal failure (ARF) ([place]) 08/06/2019  . GI bleed 08/06/2019  . Pneumothorax on right   . Fluid overload 07/27/2019  . Pneumonia due to COVID-19 virus 07/25/2019  . Acute respiratory failure due to COVID-19 ([place]) 07/25/2019  . AKI (acute kidney injury) ([place]) 07/25/2019  . Dyspepsia 11/26/2014  . BPH (benign prostatic hyperplasia) 10/31/2013  . Achilles rupture 04/05/2011  . S/P Achilles tendon repair 04/05/2011   Past Medical History:  Past Medical History:  Diagnosis Date  . COVID-19   . Heartburn    Past Surgical History:  Past Surgical History:  Procedure Laterality Date  . ACHILLES TENDON SURGERY Right 2012  . COLONOSCOPY  2009   [facility]  . ESOPHAGOGASTRODUODENOSCOPY N/A 12/25/2014   Procedure: ESOPHAGOGASTRODUODENOSCOPY (EGD);  Surgeon: Danie Binder,  MD;  Location: [facility];  Service: Endoscopy;  Laterality: N/A;  830am  . ESOPHAGOGASTRODUODENOSCOPY N/A 08/09/2019   Procedure: ESOPHAGOGASTRODUODENOSCOPY (EGD);  Surgeon: Ronald Lobo, MD;  Location: [facility];  Service: Endoscopy;  Laterality: N/A;  Patient is already sedated, so I do not anticipate need for further sedation.  Okay from my standpoint to do either at the bedside or in the operating room  . HEMOSTASIS CLIP PLACEMENT  08/09/2019   Procedure: HEMOSTASIS CLIP PLACEMENT;  Surgeon: Ronald Lobo, MD;  Location: [facility] ENDOSCOPY;  Service: Endoscopy;;  . HEMOSTASIS CONTROL  08/09/2019   Procedure: HEMOSTASIS CONTROL;  Surgeon: Ronald Lobo, MD;  Location: [facility] ENDOSCOPY;  Service: Endoscopy;;  epi   . IR FLUORO GUIDE CV LINE LEFT  09/02/2019  . IR US GUIDE VASC ACCESS LEFT  09/02/2019  . KNEE SURGERY Left 1980's  . none    . WRIST SURGERY Left 1970's   Social History:  reports that he has never smoked. He has never used smokeless tobacco. He reports current alcohol use of about 2.0 standard drinks of alcohol per week. He reports that he does not use drugs.  Family / Support Systems Marital Status: Married Patient Roles: Spouse, Parent, Other (Comment) Spouse/Significant Other: [name] [phone]  [phone] Children: Dionisio David [phone]  Dre-son [phone]  Drew-son [phone] Other Supports: Friends and church members Anticipated Caregiver: Wife and other family members Ability/Limitations of Caregiver: Min assist-wife is healthy and able to provide physical care Caregiver Availability: 24/7 Family Dynamics: Close knit family who depend upon one another and will do for one another. Pt has always  been independent and taken care of himself, he is not used to being in this role and wants to do as much as he can for himself by DC  Social History Preferred language: English Religion: Baptist Cultural Background: No issues Education:  Secretary/administrator educated Read: Yes Write: Yes Employment Status: Retired Date Retired/Disabled/Unemployed: 2 months before becoming ill he retired Public relations account executive Issues: No issues Guardian/Conservator: None-according to MD pt is capable of making his own decisions while here, will include his wife per pt's request   Abuse/Neglect Abuse/Neglect Assessment Can Be Completed: Yes Physical Abuse: Denies Verbal Abuse: Denies Sexual Abuse: Denies Exploitation of patient/patient's resources: Denies Self-Neglect: Denies  Emotional Status Pt's affect, behavior and adjustment status: Pt is motivated to regain his independence from here, he has always been independent and taken care of himself and enjoyed being acitve and playing golf. He voiced this has been a humbling experience for him. Recent Psychosocial Issues: was healthy prior to COVID Psychiatric History: No history deferred depression screen to allow him to adjust to the new unit and intensive rehab. Do feel he would benefit from seeing neuro-psych while here due to length of illness and hospitalization Substance Abuse History: No issues  Patient / Family Perceptions, Expectations & Goals Pt/Family understanding of illness & functional limitations: Pt and wife can explain his illness and deficits as a result of it. Both are pleased he is on the rehab unit the place he needs to be to recover and get back home. Both talk with the MD's daily and feel they have a good understanding of treatment going forward. Premorbid pt/family roles/activities: husband, father, grandfather, retiree, church member, friend, Air cabin crew, etc Anticipated changes in roles/activities/participation: resume Pt/family expectations/goals: Pt states: " I'm glad to be here but am so tired from therapies, I will do my bets while here."  Wife states: " I am hopeful he will do well and do better than you all think. He is a Associate Professor: None Premorbid Home Care/DME Agencies: None Transportation available at discharge: Wife-pt drove prior to admission Resource referrals recommended: Neuropsychology  Discharge Planning Living Arrangements: Spouse/significant other Support Systems: Spouse/significant other, Children, Other relatives, Water engineer, Social worker community Type of Residence: Private residence Insurance underwriter Resources: Multimedia programmer (specify)([employer]) Museum/gallery curator Resources: Fish farm manager, Family Support Financial Screen Referred: No Living Expenses: Own Money Management: Patient, Spouse Does the patient have any problems obtaining your medications?: No Home Management: Both he and wife-wife is doing now Patient/Family Preliminary Plans: Return home with wife and other family members assisting. Will be dependent upon his level and how much care he will require. He is hoping his kidneys will begin working on their own and he will not need HD. Will encourage wife to see him in therpaies to get a realistic picture for discharge. Social Work Anticipated Follow Up Needs: HH/OP, Support Group  Clinical Impression Pleasant gentleman who is glad to be her eon rehab to get the intensive therapies he needs to recover from this. His wife and family are involved and will assist him at discharge. He is hoping his kidneys will begin working so he will not need HD as an OP. Has been set up in [place] if needed. Will work on discharge needs and have neuro-psych see when appropriate.  Elease Hashimoto 10/06/2019, 2:44 PM

## 2019-10-06 NOTE — Progress Notes (Signed)
Nutrition Follow-up  RD working remotely.  DOCUMENTATION CODES:   Not applicable  INTERVENTION:   - Continue Nepro Shake po TID, each supplement provides 425 kcal and 19 grams protein  - Continue Pro-stat 30 ml BID, each supplement provides 100 kcals and 15 grams protein.   - Continue with liberalized REGULAR diet  - Continue renal MVI daily at bedtime  - Encourage adequate PO intake  NUTRITION DIAGNOSIS:   Increased nutrient needs related to acute illness, wound healing as evidenced by estimated needs.  Ongoing, being addressed via oral nutrition supplements  GOAL:   Patient will meet greater than or equal to 90% of their needs  Progressing  MONITOR:   PO intake, Supplement acceptance, Labs, Weight trends, Skin  REASON FOR ASSESSMENT:   Consult Wound healing  ASSESSMENT:   68 yo male admitted to CIR on 12/11 after long hospitalization due to respiratory failure from COVID 19 pna, AKI requiring CRRT and ultimately iHD. PMH include COVID 19 infection, heart burn.  RD consulted for wound healing. Pt was initially assessed on 12/12.  Unable to reach pt via phone call to room.  Pt accepting 100% of Nepro shakes and Pro-stat per [facility] documentation.  Pt continues to receive HD. Lat treatment on 12/12 with 1000 ml net UF.  Weight is down 2 lbs compared to admit weight but up 10 kg compared to weight on 10/02/19. Unable to determine pt's dry weight at this time.  Meal Completion: 10-80% x last 7 meals  Medications reviewed and include: Aranesp, Nepro TID, Pro-stat BID, Magic Mouthwash, rena-vit, Protonix, sucralfate  Labs reviewed: hemoglobin 7.6  UOP: 500 ml x 24 hours  NUTRITION - FOCUSED PHYSICAL EXAM:  Unable to complete at this time. RD working remotely.  Diet Order:   Diet Order            Diet regular Room service appropriate? Yes; Fluid consistency: Thin  Diet effective now              EDUCATION NEEDS:   Not appropriate for education  at this time  Skin:  Skin Assessment: Skin Integrity Issues: Skin Integrity Issues: Stage III: sacrum (per WOC note)  Last BM:  10/05/19 medium type 6  Height:   Ht Readings from Last 1 Encounters:  10/03/19 5\' 10"  (1.778 m)    Weight:   Wt Readings from Last 1 Encounters:  10/06/19 84 kg    Ideal Body Weight:  75.5 kg  BMI:  Body mass index is 26.57 kg/m.  Estimated Nutritional Needs:   Kcal:  2200-2400 kcals  Protein:  115-140 g  Fluid:  1000 mL plus UOP    Gaynell Face, MS, RD, LDN Inpatient Clinical Dietitian Pager: [phone] Weekend/After Hours: [phone]

## 2019-10-06 NOTE — IPOC Note (Signed)
Overall Plan of Care [facility]) Patient Details Name: [name] MRN: [id number] DOB: July 18, 1951  Admitting Diagnosis: [facility] Problems: Principal Problem:   Debility Active Problems:   Pneumonia due to COVID-19 virus   Acute respiratory failure due to COVID-19 ([place])   AKI (acute kidney injury) ([place])   Oropharyngeal dysphagia     Functional Problem List: Nursing Bladder, Bowel, Endurance, Medication Management, Motor, Pain, Safety, Skin Integrity, Sensory, Nutrition  PT Balance, Behavior, Edema, Endurance, Motor, Nutrition, Pain, Perception, Safety, Sensory, Skin Integrity  OT Balance, Perception, Safety, Cognition, Sensory, Skin Integrity, Endurance, Motor, Pain  SLP Cognition  TR         Basic ADL's: OT Eating, Grooming, Bathing, Toileting, Dressing     Advanced  ADL's: OT Simple Meal Preparation     Transfers: PT Bed Mobility, Bed to Chair, Car, Furniture, Floor  OT Toilet, Metallurgist: PT Ambulation, Emergency planning/management officer, Stairs     Additional Impairments: OT Fuctional Use of Upper Extremity  SLP None      TR      Anticipated Outcomes Item Anticipated Outcome  Self Feeding Supervision/cuing  Swallowing      Basic self-care  Supervision-Min A  Toileting  Min A   Bathroom Transfers Min A  Bowel/Bladder  Min/mod assist  Transfers  Min assist to and from [facility] with lateral scoot/squat pivot technique  Locomotion  WC level with supervision assist.  Communication     Cognition  Supervision  Pain  < 4  Safety/Judgment  Supervision   Therapy Plan: PT Intensity: Minimum of 1-2 x/day ,45 to 90 minutes PT Frequency: 5 out of 7 days PT Duration Estimated Length of Stay: 4 weeks OT Intensity: Minimum of 1-2 x/day, 45 to 90 minutes OT Frequency: 5 out of 7 days OT Duration/Estimated Length of Stay: 3.5-4 weeks SLP Intensity: Minumum of 1-2 x/day, 30 to 90 minutes SLP Frequency: 3 to 5 out of 7 days SLP Duration/Estimated  Length of Stay: 21-28 days   Due to the current state of emergency, patients may not be receiving their 3-hours of Medicare-mandated therapy.   Team Interventions: Nursing Interventions Patient/Family Education, Bladder Management, Bowel Management, Disease Management/Prevention, Skin Care/Wound Management, Medication Management, Pain Management, Discharge Planning, Psychosocial Support  PT interventions Ambulation/gait training, Discharge planning, Functional mobility training, Psychosocial support, Therapeutic Activities, Visual/perceptual remediation/compensation, Wheelchair propulsion/positioning, Therapeutic Exercise, Disease management/prevention, Balance/vestibular training, UE/LE Strength taining/ROM, Splinting/orthotics, Pain management, Neuromuscular re-education, Skin care/wound management, DME/adaptive equipment instruction, Cognitive remediation/compensation, Community reintegration, Barrister's clerk education, Technical sales engineer stimulation, IT trainer, UE/LE Coordination activities  OT Interventions Training and development officer, Discharge planning, Functional electrical stimulation, Pain management, Self Care/advanced ADL retraining, Therapeutic Activities, UE/LE Coordination activities, Cognitive remediation/compensation, Disease mangement/prevention, Functional mobility training, Patient/family education, Skin care/wound managment, Therapeutic Exercise, Visual/perceptual remediation/compensation, Wheelchair propulsion/positioning, UE/LE Strength taining/ROM, Splinting/orthotics, Psychosocial support, Neuromuscular re-education, DME/adaptive equipment instruction, Community reintegration  SLP Interventions Cognitive remediation/compensation, English as a second language teacher, Functional tasks, Therapeutic Exercise, Patient/family education, Medication managment  TR Interventions    SW/CM Interventions Discharge Planning, Psychosocial Support, Patient/Family Education   Barriers to Discharge MD   Medical stability  Nursing Wound Care    PT Inaccessible home environment, Medical stability, Home environment access/layout, Incontinence, Wound Care, Insurance for SNF coverage, Hemodialysis    OT Medical stability, Home environment access/layout, Wound Care, Lack of/limited family support, Hemodialysis, New oxygen    SLP      SW       Team Discharge Planning: Destination: PT-Home ,OT- Home , SLP-Home Projected Follow-up:  PT-Home health PT, OT-  Home health OT, SLP-Home Health SLP Projected Equipment Needs: PT-Rolling walker with 5" wheels, Wheelchair (measurements), Wheelchair cushion (measurements), To be determined, Sliding board, OT- To be determined, SLP-None recommended by SLP Equipment Details: PT- , OT-  Patient/family involved in discharge planning: PT- Patient, Family member/caregiver,  OT-Patient, SLP-Patient  MD ELOS: 21-28d Medical Rehab Prognosis:  Fair Assessment:  68 year old right-handed male with history of GERD who presented to [facility] hospital 07/26/2019 with shortness of breath and hypoxia.  He was diagnosed with COVID-19 on September 20 of 2020.  Oxygen saturations in the 40th percentile range.  Placed on a nonrebreather mask.  Patient was given IV steroids and remdesivir that he completed 07/30/2019.  He did require intubation for airway protection.  He had 1 unit of convalescent plasma and had received Decadron until 08/04/2019.  He was treated with vancomycin and cefepime through 08/02/2019.  Initial chest x-ray demonstrated diffuse multifocal infiltrates.  CT of the chest negative for pulmonary emboli.  He was admitted to [place] pH 7.41, PCO2 44 PO2 of 87 on 100% and AA gradient calculates out to 571.  Noted elevated creatinine of 1.51.  Renal service is consulted for elevated creatinine/AKI necessitating the need for hemodialysis 08/07/2019 but has since been deemed chronic Tuesday Thursday Saturday schedule.  Gastroenterology services consulted for  ongoing melena drop in hemoglobin and patient was transfused 10 units of packed red blood cells total throughout hospital admission with latest hemoglobin 8.4.  An endoscopy was completed showing no active bleeding or blood in the stomach.  Very large gastric residual mostly tube feeding without anatomic gastric outlet obstruction suggestive gastric ileus.  Patient's hemoglobin hematocrit have stabilized.  Long-term intubation requiring tracheostomy tube 08/22/2019 patient is since been extubated.  Diet has been advanced to regular consistency.  Subcutaneous heparin for DVT prophylaxis.  Hospital course complicated by bouts of atrial fibrillation with RVR 08/09/2019 patient was initially placed on amiodarone.  Neurology service was consulted 08/27/2019 for altered mental status with MRI of the brain showing small acute to early subacute infarcts in bilateral cerebral cortex and right cerebellum and maintained on low-dose aspirin for CVA prophylaxis.. Patient with development of sacral decubitus wound care dressing changes as per wound care nurse as well as wound care to partial-thickness areas of tissue loss at posterior aspect of penile shaft again with dressing changes as directed.  Therapy evaluations completed patient was admitted for a comprehensive rehab program.    Now requiring 24/7 Rehab RN,MD, as well as CIR level PT, OT and SLP.  Treatment team will focus on ADLs and mobility with goals set at [facility]   See Team Conference Notes for weekly updates to the plan of care

## 2019-10-06 NOTE — Progress Notes (Signed)
[name] PHYSICAL MEDICINE & REHABILITATION PROGRESS NOTE   Subjective/Complaints:   RIght great toe pain no hx trauma, did not wear prevalon yesterday pm   ROS: Patient denies , nausea, vomiting, diarrhea, cough, shortness of breath or chest pain, joint or back pain,    Objective:   VAS [place] LOWER EXTREMITY VENOUS (DVT)  Result Date: 10/05/2019  Lower Venous Study Indications: Rehab.  Comparison Study: Prior study from 07/31/19 is available for comparison Performing Technologist: Sharion Dove RVS  Examination Guidelines: A complete evaluation includes B-mode imaging, spectral Doppler, color Doppler, and power Doppler as needed of all accessible portions of each vessel. Bilateral testing is considered an integral part of a complete examination. Limited examinations for reoccurring indications may be performed as noted.  +---------+---------------+---------+-----------+----------+--------------+ RIGHT    CompressibilityPhasicitySpontaneityPropertiesThrombus Aging +---------+---------------+---------+-----------+----------+--------------+ CFV      Full           Yes      Yes                                 +---------+---------------+---------+-----------+----------+--------------+ SFJ      Full                                                        +---------+---------------+---------+-----------+----------+--------------+ FV Prox  Full                                                        +---------+---------------+---------+-----------+----------+--------------+ FV Mid   Full                                                        +---------+---------------+---------+-----------+----------+--------------+ FV DistalFull                                                        +---------+---------------+---------+-----------+----------+--------------+ PFV      Full                                                         +---------+---------------+---------+-----------+----------+--------------+ POP      Full           Yes      Yes                                 +---------+---------------+---------+-----------+----------+--------------+ PTV      Full                                                        +---------+---------------+---------+-----------+----------+--------------+  PERO     Full                                                        +---------+---------------+---------+-----------+----------+--------------+   +---------+---------------+---------+-----------+----------+--------------+ LEFT     CompressibilityPhasicitySpontaneityPropertiesThrombus Aging +---------+---------------+---------+-----------+----------+--------------+ CFV      Full           Yes      Yes                                 +---------+---------------+---------+-----------+----------+--------------+ SFJ      Full                                                        +---------+---------------+---------+-----------+----------+--------------+ FV Prox  Full                                                        +---------+---------------+---------+-----------+----------+--------------+ FV Mid   Full                                                        +---------+---------------+---------+-----------+----------+--------------+ FV DistalFull                                                        +---------+---------------+---------+-----------+----------+--------------+ PFV      Full                                                        +---------+---------------+---------+-----------+----------+--------------+ POP      Full           Yes      Yes                                 +---------+---------------+---------+-----------+----------+--------------+ PTV      Full                                                         +---------+---------------+---------+-----------+----------+--------------+ PERO     Full                                                        +---------+---------------+---------+-----------+----------+--------------+  Summary: Right: Findings appear essentially unchanged compared to previous examination. There is no evidence of deep vein thrombosis in the lower extremity. Left: Findings appear essentially unchanged compared to previous examination. There is no evidence of deep vein thrombosis in the lower extremity.  *See table(s) above for measurements and observations.    Preliminary    Recent Labs    10/06/19 0650  WBC 11.0*  HGB 7.6*  HCT 25.7*  PLT 307   No results for input(s): NA, K, CL, CO2, GLUCOSE, BUN, CREATININE, CALCIUM in the last 72 hours.  Intake/Output Summary (Last 24 hours) at 10/06/2019 0812 Last data filed at 10/06/2019 0445 Gross per 24 hour  Intake 380 ml  Output 500 ml  Net -120 ml     Physical Exam: Vital Signs Blood pressure 135/84, pulse 87, temperature 98.7 F (37.1 C), temperature source Oral, resp. rate 18, height 5\' 10"  (1.778 m), weight 84 kg, SpO2 100 %. Constitutional: No distress . Vital signs reviewed. HEENT: EOMI, oral membranes moist, thrush Neck: supple Cardiovascular: RRR without murmur. No JVD    Respiratory: CTA Bilaterally without wheezes or rales. Normal effort    GI: BS +, non-tender, non-distended  Ext: no edema Skin:Right foot no lesions or skin discoloration  Musculoskeletal: 4/5 strength throughout--stable, no pain with Righ tgreat toe ROM, no erythema  Psych: pleasant, normal affect Neurological: normal insight and awareness    Assessment/Plan: 1. Functional deficits secondary to debility after multiple medical which require 3+ hours per day of interdisciplinary therapy in a comprehensive inpatient rehab setting.  Physiatrist is providing close team supervision and 24 hour management of active medical problems  listed below.  Physiatrist and rehab team continue to assess barriers to discharge/monitor patient progress toward functional and medical goals  Care Tool:  Bathing    Body parts bathed by patient: Chest, Abdomen, Right upper leg, Left upper leg, Face   Body parts bathed by helper: Right arm, Left arm, Front perineal area, Buttocks, Right lower leg, Left lower leg     Bathing assist Assist Level: 2 Helpers     Upper Body Dressing/Undressing Upper body dressing   What is the patient wearing?: Hospital gown only    Upper body assist Assist Level: Total Assistance - Patient < 25%    Lower Body Dressing/Undressing Lower body dressing      What is the patient wearing?: Incontinence brief     Lower body assist Assist for lower body dressing: 2 Helpers     Toileting Toileting Toileting Activity did not occur (Clothing management and hygiene only): N/A (no void or bm)  Toileting assist Assist for toileting: Set up assist Assistive Device Comment: urinal    Transfers Chair/bed transfer  Transfers assist  Chair/bed transfer activity did not occur: Safety/medical concerns  Chair/bed transfer assist level: Dependent - mechanical lift     Locomotion Ambulation   Ambulation assist   Ambulation activity did not occur: Safety/medical concerns          Walk 10 feet activity   Assist  Walk 10 feet activity did not occur: Safety/medical concerns        Walk 50 feet activity   Assist Walk 50 feet with 2 turns activity did not occur: Safety/medical concerns         Walk 150 feet activity   Assist Walk 150 feet activity did not occur: Safety/medical concerns         Walk 10 feet on uneven surface  activity   Assist Walk  10 feet on uneven surfaces activity did not occur: Safety/medical concerns         Wheelchair     Assist   Type of Wheelchair: Manual    Wheelchair assist level: Dependent - Patient 0%(TIS WC) Max wheelchair distance:  150    Wheelchair 50 feet with 2 turns activity    Assist        Assist Level: Dependent - Patient 0%   Wheelchair 150 feet activity     Assist      Assist Level: Dependent - Patient 0%   Blood pressure 135/84, pulse 87, temperature 98.7 F (37.1 C), temperature source Oral, resp. rate 18, height 5\' 10"  (1.778 m), weight 84 kg, SpO2 100 %.  Medical Problem List and Plan: 1.  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             -patient may shower             -ELOS/Goals: modI PT, OT, SLP  -Patient is beginning CIR therapies today including PT, OT, and SLP  2.  Antithrombotics: -DVT/anticoagulation: Subcutaneous heparin. Negative  vascular study             -antiplatelet therapy: Aspirin 81 mg daily 3. Pain Management: Hycet as needed, Right great toe pain, no lesions pain with pressure over distal tip likely due to positioning in bed without prevalon boots  4. Mood: Provide emotional support. Rozerem 8 mg nightly             -antipsychotic agents: N/A 5. Neuropsych: This patient is capable of making decisions on his own behalf.             SLP recommends additional diagnostics of higher level cognitive skills.  6. Skin/Wound Care: Wound care sacral decubitus with tunneling.   -pack with iodoform gauze bid  -woc rn consulted  -nutrition, turning, prevalon boots for heels- needs order to place qhs  7. Fluids/Electrolytes/Nutrition: Routine in and outs with follow-up chemistries 8.  Anemia/melena.  Follow-up CBC.  Continue Aranesp 9.  Tracheostomy 08/22/2019.  Decannulated.  Diet advanced to regular.  Continue nebulizers as directed 10.  MSSA bacteremia/pneumonia.  Antibiotic therapy completed 11.  Hyperlipidemia.  Lipitor 12. Oropharyngeal dysphagia: Appreciate speech therapy recommendations;  regular diet with oral care BID  -MMW for thrush    LOS: 3 days A FACE TO FACE EVALUATION WAS PERFORMED  Charlett Blake 10/06/2019, 8:12 AM

## 2019-10-06 NOTE — Progress Notes (Signed)
Occupational Therapy Session Note  Patient Details  Name: [name] MRN: [id number] Date of Birth: [birth year]  Today's Date: 10/06/2019 OT Individual Time: 1420-1535 OT Individual Time Calculation (min): 75 min   Short Term Goals: Week 1:  OT Short Term Goal 1 (Week 1): Pt will complete toilet or BSC transfer with 2 assist for OOB toileting OT Short Term Goal 2 (Week 1): Pt will complete 1 grooming task w/c level at sink with mod vcs to include Lt limb OT Short Term Goal 3 (Week 1): Pt will don overhead shirt and initiate threading L UE first  Skilled Therapeutic Interventions/Progress Updates:    Pt greeted in bed, asleep, and easily wakened. Stating that he felt very fatigued from previous therapies, motivated to participate in bathing/dressing tasks bedlevel. Elevated HOB to work on trunk control during UB tasks. Pt with frequent Rt LOBs and needing assist and vcs to obtain upright neutral trunk. Vcs and assistance required for washing Lt side. HOH for using Lt functionally with pt exhibiting grasp/release abilities but no active movement noted distally. Pt able to tolerate figure 4 position with L LE for bathing/dressing LB but only able to tolerate crossing Rt leg at ankles during stated tasks due to LE pain. RN was in during session to change soiled sacral dressing and provide +2 assist during pericare and brief change. Pt rolled Rt>Lt with Mod A towards Lt and Total A towards Rt. OT donned Teds and footwear. HOH for using Lt hand during grooming tasks such as hand washing, lotion application, and deodorant application. Initiated education regarding joint protection by having pt assist with moving affected limb during mobility. At end of session pt was placed in sidelying position for pressure relief. Left him with all needs within reach and 4 bedrails up. Tx focus placed on Lt NMR, Lt attention, ADL retraining, and activity tolerance.    02 sats on .5L 92% and above during tx  Camila Norville  A Valeria Boza 10/06/2019, 4:01 PM   Therapy Documentation Precautions:  Precautions Precautions: Fall Precaution Comments: Sacral decubitus wounds, Lt hemi Restrictions Weight Bearing Restrictions: No Vital Signs:   Pain: Pain Assessment Pain Score: 0-No pain ADL: ADL Eating: Maximal assistance(for sitting balance) Where Assessed-Eating: Edge of bed Grooming: Maximal assistance Where Assessed-Grooming: Edge of bed Upper Body Bathing: Maximal assistance Where Assessed-Upper Body Bathing: Edge of bed Lower Body Bathing: Dependent(+2 assist) Where Assessed-Lower Body Bathing: Bed level Upper Body Dressing: Dependent Where Assessed-Upper Body Dressing: Edge of bed Lower Body Dressing: Dependent(+2 assist) Where Assessed-Lower Body Dressing: Bed level Toileting: Not assessed Toilet Transfer: Not assessed Tub/Shower Transfer: Not assessed      Therapy/Group: Individual Therapy  Rubel Heckard A Wilhelmenia Addis 10/06/2019, 4:01 PM

## 2019-10-06 NOTE — Progress Notes (Addendum)
Related encounter: ED to Hosp-Admission (Discharged) from 07/25/2019 in [place] All   Show:Clear all [x] Manual[x] Template[] Copied  Added by: [x] Love, Ivan Anchors, PA-C[x] Raulkar, Clide Deutscher, MD  [] Hover for details          Physical Medicine and Rehabilitation Consult   Reason for Consult: Functional deficits due to COVID 19 with multiple medical issues.  Referring Physician: Dr. Algis Liming.      HPI: [name] is a 68 y.o. male originally diagnosed with COVID 19 on 07/13/19 with progressive tachypnea and SOB therefore admitted to [facility] on 07/26/19 and treated with Remdesivir, CCP, decadron, Vancomycin/Cefepime and required intubation due to ARDs.  Hospital course significant for MSSA bacteremia with volume overload, hypotensive shock, AKI with metabolic acidosis requiring CRRT, Afib with RVR, melena due to gastric ulcer with ABLA treated with clipping by Dr. Cristina Gong. He developed PTX with empyema and large bronchopleural fistula with persistent leak requiring chest tube but continued to have persistent leak. Marland Kitchen He underwent tracheostomy on 08/22/19, was transitioned to HD with pressor support and transferred to [facility]. Dr. Roxan Hockey following of input.  He developed decreased in LOC with inability to follow commands on 11/4 and was found to have small acute/subacute infarcts involving bilateral cerebral cortex and right cerebellum. EEG showed moderate diffuse encephalopathy and Dr. Erlinda Hong felt that stroke likely due to hypotension secondary to HD and sepsis --PAF could be potential etiology --ASA recommended for secondary stroke prevention with SBP range 120-140. MS changes felt to be due to encephalopathy and EEG negative for seizures. HD catheter placed by Dr. Vernard Gambles on 11/10.  Family elected on full scope of care and chest tube d/c 11/16, he has been extubated to ATC and is tolerating PMSV. He was started on diet and has been advanced to  dysphagia 2, nectars to allow for more palatable options. He has been downsized to CFS#6 and PCCM recommends avoiding capping due to excessive secretions. Therapy ongoing and patient with delayed processing, difficulty sequencing, limited vocalization and using tilt bed to get to 50 degrees with minimal orthostatic symptoms.  Feeding tube remains in place--did eat 505 at breakfast this am per nurse. CIR recommended due to functional deficits.      Review of Systems  Unable to perform ROS: Mental acuity  Eyes: Positive for blurred vision.  Respiratory: Positive for cough and sputum production.   Cardiovascular: Negative for chest pain.  Gastrointestinal: Negative for abdominal pain.  Musculoskeletal: Positive for back pain. Negative for myalgias.  Neurological: Positive for weakness.  Psychiatric/Behavioral: Depression: .1rehab.            Past Medical History:  Diagnosis Date  . COVID-19    . Heartburn             Past Surgical History:  Procedure Laterality Date  . ACHILLES TENDON SURGERY Right 2012  . COLONOSCOPY   2009    [facility]  . ESOPHAGOGASTRODUODENOSCOPY N/A 12/25/2014    Procedure: ESOPHAGOGASTRODUODENOSCOPY (EGD);  Surgeon: Danie Binder, MD;  Location: [facility];  Service: Endoscopy;  Laterality: N/A;  830am  . ESOPHAGOGASTRODUODENOSCOPY N/A 08/09/2019    Procedure: ESOPHAGOGASTRODUODENOSCOPY (EGD);  Surgeon: Ronald Lobo, MD;  Location: [facility];  Service: Endoscopy;  Laterality: N/A;  Patient is already sedated, so I do not anticipate need for further sedation.  Okay from my standpoint to do either at the bedside or in the operating room  . HEMOSTASIS CLIP PLACEMENT  08/09/2019    Procedure: HEMOSTASIS CLIP PLACEMENT;  Surgeon: Ronald Lobo, MD;  Location: [facility];  Service: Endoscopy;;  . HEMOSTASIS CONTROL   08/09/2019    Procedure: HEMOSTASIS CONTROL;  Surgeon: Ronald Lobo, MD;  Location: [facility]OSCOPY;  Service: Endoscopy;;  epi    . IR FLUORO  GUIDE CV LINE LEFT   09/02/2019  . IR US GUIDE VASC ACCESS LEFT   09/02/2019  . KNEE SURGERY Left 1980's  . none      . WRIST SURGERY Left 1970's           Family History  Problem Relation Age of Onset  . Colon cancer Neg Hx    . Colon polyps Neg Hx        Social History:  Married--wife retired.  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 He reports that he has never smoked. He has never used smokeless tobacco. He reports current alcohol use of about 2.0 standard drinks of alcohol per day. He reports that he does not use drugs.      Allergies: No Known Allergies      Medications Prior to Admission  Medication Sig Dispense Refill  . ondansetron (ZOFRAN ODT) 4 MG disintegrating tablet Take 1 tablet (4 mg total) by mouth every 8 (eight) hours as needed for nausea or vomiting. 20 tablet 0      Home: Home Living Family/patient expects to be discharged to:: Private residence Living Arrangements: Spouse/significant other Available Help at Discharge: Family Type of Home: House Home Access: Stairs to enter Technical brewer of Steps: 3 Entrance Stairs-Rails: Right, Left Home Layout: One level Bathroom Shower/Tub: Chiropodist: Handicapped height Bathroom Accessibility: Yes Home Equipment: None  Lives With: Spouse  Functional History: Prior Function Level of Independence: Independent Comments: golfs everyday, retired 2 months  Functional Status:  Mobility: Bed Mobility Overal bed mobility: Needs Assistance Bed Mobility: Rolling Rolling: Max assist Sidelying to sit: Total assist, +2 for physical assistance Sit to sidelying: +2 for physical assistance, Total assist, +2 for safety/equipment General bed mobility comments: Max assist to roll for positioning on side at end of session.  Transfers General transfer comment: will need Maximove   ADL: ADL Overall ADL's : Needs assistance/impaired Eating/Feeding: NPO Grooming: Maximal assistance, Oral care,  Wash/dry face Functional mobility during ADLs: Total assistance, +2 for physical assistance, Maximal assistance General ADL Comments: totalA for all ADL, hand over hand and support at elbow to wash face;pt able to tolerate sitting EOB for 38min with maxA for stability   Cognition: Cognition Overall Cognitive Status: Impaired/Different from baseline Arousal/Alertness: Awake/alert Orientation Level: Oriented X4 Attention: Focused Focused Attention: Impaired Focused Attention Impairment: Verbal basic, Functional basic Cognition Arousal/Alertness: Lethargic Behavior During Therapy: Flat affect Overall Cognitive Status: Impaired/Different from baseline Area of Impairment: Attention, Safety/judgement, Following commands, Awareness, Problem solving Current Attention Level: Sustained Following Commands: Follows one step commands with increased time Safety/Judgement: Decreased awareness of safety, Decreased awareness of deficits Awareness: Emergent Problem Solving: Slow processing, Decreased initiation, Difficulty sequencing, Requires verbal cues General Comments: Pt able to vocalize when encouraged via PMV in cuffless trach, lethargic today, but participatory.  Difficult to assess due to: Tracheostomy     Blood pressure 135/80, pulse 91, temperature 98.7 F (37.1 C), temperature source Oral, resp. rate 18, height 5\' 10"  (1.778 m), weight 93.6 kg, SpO2 99 %.    Physical Exam  Nursing note and vitals reviewed. Currently getting dialysis.  Constitutional: He appears well-developed and well-nourished.  Lying across the  bed with head flexed at right bed rail. Unable to raise head or self correct. [place] in place.   Neck:  CFS #6 in place with PMSV--frequent coughing therefore PMSV removed with drainage of copious thick purulent secretions.   GI: He exhibits no distension. There is no abdominal tenderness.  Neurological: He is alert. A cranial nerve deficit is present.  Left facial weakness  with dysphonia.  Did engage and attempted to answer biographic questions.  Left inattention needing max cues to scan to left and to keep eyes midline. Possible left visual field deficit. Left dense hemiparesis with decreased sensation. RLE weakness noted with RUE apraxia.  Musculoskeletal: 0/5 movement in LUE. Otherwise diffusely weak with 3/5 strength throughout, with the exception of 2/5 HF bilaterally.  Ext: no edema. With bilateral PRAFO boots on.  Psych: pleasant, normal affect   Lab Results Last 24 Hours       Results for orders placed or performed during the hospital encounter of 07/25/19 (from the past 24 hour(s))  Glucose, capillary     Status: None    Collection Time: 09/14/19 11:47 AM  Result Value Ref Range    Glucose-Capillary 83 70 - 99 mg/dL  Glucose, capillary     Status: None    Collection Time: 09/14/19  4:16 PM  Result Value Ref Range    Glucose-Capillary 85 70 - 99 mg/dL  Glucose, capillary     Status: None    Collection Time: 09/14/19  7:22 PM  Result Value Ref Range    Glucose-Capillary 94 70 - 99 mg/dL    Comment 1 Notify RN      Comment 2 Document in Chart    Glucose, capillary     Status: None    Collection Time: 09/15/19 12:01 AM  Result Value Ref Range    Glucose-Capillary 90 70 - 99 mg/dL    Comment 1 Notify RN      Comment 2 Document in Chart    Glucose, capillary     Status: None    Collection Time: 09/15/19  3:58 AM  Result Value Ref Range    Glucose-Capillary 78 70 - 99 mg/dL    Comment 1 Notify RN      Comment 2 Document in Chart        Imaging Results (Last 48 hours)  No results found.       Assessment/Plan: Diagnosis: Impaired mobility and ADLs secondary to COVID-19 pneumonia.  1. Does the need for close, 24 hr/day medical supervision in concert with the patient's rehab needs make it unreasonable for this patient to be served in a less intensive setting? Yes 2. Co-Morbidities requiring supervision/potential complications: tachypnea, SOB,  ARDS, MSSA bacteremia with volume overload, hypotensive shock, AKI with metabolic acidosis requiring CRRT, afib with RVR, melena due to gastric ulcer, pneumothorax.  3. Due to safety, skin/wound care, disease management, medication administration, pain management and patient education, does the patient require 24 hr/day rehab nursing? Yes 4. Does the patient require coordinated care of a physician, rehab nurse, therapy disciplines of PT, OT, and SLP to address physical and functional deficits in the context of the above medical diagnosis(es)? Yes Addressing deficits in the following areas: balance, endurance, locomotion, strength, transferring, bowel/bladder control, bathing, dressing, feeding, grooming, toileting, cognition, speech, language, swallowing and psychosocial support 5. Can the patient actively participate in an intensive therapy program of at least 3 hrs of therapy per day at least 5 days per week? Yes 6. The potential for patient to  make measurable gains while on inpatient rehab is excellent 7. Anticipated functional outcomes upon discharge from inpatient rehab are MinA  with PT, MinA with OT, modified independent with SLP. 8. Estimated rehab length of stay to reach the above functional goals is: 3 weeks 9. Anticipated discharge destination: Home 10. Overall Rehab/Functional Prognosis: excellent   RECOMMENDATIONS: This patient's condition is appropriate for continued rehabilitative care in the following setting: CIR Patient has agreed to participate in recommended program. Yes Note that insurance prior authorization may be required for reimbursement for recommended care.   Comment: [name] is currently very fatigued, receiving dialysis. He has significant PT, OT, and SLP needs and would benefit from CIR level therapies.    Bary Leriche, PA-C 09/15/2019    I have personally performed a face to face diagnostic evaluation, including, but not limited to relevant history and physical  exam findings, of this patient and developed relevant assessment and plan.  Additionally, I have reviewed and concur with the physician assistant's documentation above.   Leeroy Cha, MD

## 2019-10-06 NOTE — Care Management Note (Signed)
Inpatient [place] Individual Statement of Services  Patient Name:  [name]  Date:  10/06/2019  Welcome to the [facility].  Our goal is to provide you with an individualized program based on your diagnosis and situation, designed to meet your specific needs.  With this comprehensive rehabilitation program, you will be expected to participate in at least 3 hours of rehabilitation therapies Monday-Friday, with modified therapy programming on the weekends.  Your rehabilitation program will include the following services:  Physical Therapy (PT), Occupational Therapy (OT), Speech Therapy (ST), 24 hour per day rehabilitation nursing, Neuropsychology, Case Management (Social Worker), Rehabilitation Medicine, Nutrition Services and Pharmacy Services  Weekly team conferences will be held on Wednesday to discuss your progress.  Your Social Worker will talk with you frequently to get your input and to update you on team discussions.  Team conferences with you and your family in attendance may also be held.  Expected length of stay: 3.5-4 weeks Overall anticipated outcome: min-max level  Depending on your progress and recovery, your program may change. Your Social Worker will coordinate services and will keep you informed of any changes. Your Social Worker's name and contact numbers are listed  below.  The following services may also be recommended but are not provided by the [place] will be made to provide these services after discharge if needed.  Arrangements include referral to agencies that provide these services.  Your insurance has been verified to be:  [employer] Your primary doctor is:  Pharmacist, community  Pertinent information will be shared with your doctor and your insurance company.  Social Worker:  Ovidio Kin, [place] or ([phone]  Information discussed with and copy given to patient by: Elease Hashimoto, 10/06/2019, 10:26 AM

## 2019-10-06 NOTE — Progress Notes (Signed)
[facility] Progress Note   Dialysis Orders:  Dialysis dependent AKI  presumed progressed to  ESRD   - outpatient HD has been arranged at [facility] [place] 12:30 pm after d/c pending no change  Assessment/Plan:  1.S/p acute hypoxemia and respiratory failure due to COVID 17:OHYWVPXT complications/ secondary infections, prolonged ICU course. R chest tubeout,trach removed 11/30. Transferred to CIR 12/11 2. [place] near 2 month acute admission for COVID/PNA/hypoixc resp failure now on [facility] -Admitted 10/3 and started CRRT on 08/07/19, has been on RRT for a little under 2 mos now. Patient prefers to avoid perm access for now hoping that renal function will recover. outpatient HD set up at [facility]. Foley out - will check pre HD Tuesday UOP increasing  3.HTN/volume:BPvariable- net UF 1 L 12/12  4. Anemia:Hgb7.7> 8.4> 7.6 HxGI bleed this admission. No heparin with HD.S/pIV iron. Continue Aranesp123mcg q Sat.- tsat 17% 11/29 - on carafate tsat drawn after 3 doses ferrlicit (062 mg given) - follow - transfuse prn - Making some urine - no labs Saturday but Cr 4.5 today - continue to trend - repeat Pre HD  5. Secondary hyperparathyroidism:CorrCa slightly high. Not on calcium or vit D, use low Ca bath. Phos nowatgoal.iPTH ordered - not checked with Sat HD - need to check Tuesday 6.IRS:WNIOEVOJJKKXF cortex and right cerebellum small acute to subacuteinfarctswithneuromuscular weakness/encephalopathy. 7. Nutrition: Alb very low - per nursing note, pt reports lack on taste (post-COVID). Addednepro + pro-stat supplements. Diet liberalized 8.Sacral decub: Pressure ulcer - wound care following. 9. Debility - per CIR 10. Code status DNR  Albert Jacobson, PA-C [facility] Beeper [phone] 10/06/2019,12:29 PM  LOS: 3 days   Subjective:   Wife visiting today.  No c/o  Rehab going ok. Ate about 1/2 of lunch Objective Vitals:    10/05/19 1431 10/05/19 1933 10/06/19 0437 10/06/19 0909  BP: 140/78 134/75 135/84   Pulse: (!) 102 90 87   Resp: 16 18 18    Temp: 99.2 F (37.3 C)  98.7 F (37.1 C)   TempSrc:  Oral Oral   SpO2: 100% 100% 100% 99%  Weight:   84 kg   Height:       Physical Exam General: NAD breathing easily Heart: RRR Lungs: no rales Abdomen: soft NT Extremities: no LE edema Dialysis Access: left IJ TDC  Intake/Output      12/13 0701 - 12/14 0700 12/14 0701 - 12/15 0700   P.O. 380 120   Total Intake(mL/kg) 380 (4.5) 120 (1.4)   Urine (mL/kg/hr) 500 (0.2)    Other     Stool 0    Total Output 500    Net -120 +120        Urine Occurrence 1 x    Stool Occurrence 1 x       Additional Objective Labs: Basic Metabolic Panel: Recent Labs  Lab 09/30/19 0800 10/02/19 0800 10/06/19 0650  NA 133* 133* 136  K 3.9 3.9 3.5  CL 95* 95* 100  CO2 26 28 28   GLUCOSE 93 104* 87  BUN 36* 33* 33*  CREATININE 6.03* 5.04* 4.55*  CALCIUM 9.4 9.3 9.6  PHOS 4.2 3.0  --    Liver Function Tests: Recent Labs  Lab 09/30/19 0800 10/02/19 0800  ALBUMIN 2.2* 2.0*   No results for input(s): LIPASE, AMYLASE in the last 168 hours. CBC: Recent Labs  Lab 10/01/19 0511 10/02/19 0802 10/03/19 0555 10/06/19 0650  WBC 13.6* 13.4* 11.3* 11.0*  HGB 8.3* 7.7* 8.4*  7.6*  HCT 26.9* 25.0* 27.5* 25.7*  MCV 91.8 92.6 92.6 93.1  PLT 350 362 281 307   Blood Culture    Component Value Date/Time   SDES BLOOD LEFT HAND 09/22/2019 0917   SPECREQUEST AEROBIC BOTTLE ONLY Blood Culture adequate volume 09/22/2019 0917   CULT  09/22/2019 0917    NO GROWTH 5 DAYS Performed at [facility], [place] [address] [place], [place] [address]    REPTSTATUS 09/27/2019 FINAL 09/22/2019 1751    Cardiac Enzymes: No results for input(s): CKTOTAL, CKMB, CKMBINDEX, TROPONINI in the last 168 hours. CBG: Recent Labs  Lab 10/02/19 1142 10/02/19 1719 10/02/19 2105 10/03/19 0715 10/03/19 1122  GLUCAP 92 86 125* 79 95    Iron Studies: No results for input(s): IRON, TIBC, TRANSFERRIN, FERRITIN in the last 72 hours. Lab Results  Component Value Date   INR 1.2 09/02/2019   INR 1.0 08/22/2019   Studies/Results: VAS [place] LOWER EXTREMITY VENOUS (DVT)  Result Date: 10/05/2019  Lower Venous Study Indications: Rehab.  Comparison Study: Prior study from 07/31/19 is available for comparison Performing Technologist: Sharion Dove RVS  Examination Guidelines: A complete evaluation includes B-mode imaging, spectral Doppler, color Doppler, and power Doppler as needed of all accessible portions of each vessel. Bilateral testing is considered an integral part of a complete examination. Limited examinations for reoccurring indications may be performed as noted.  +---------+---------------+---------+-----------+----------+--------------+ RIGHT    CompressibilityPhasicitySpontaneityPropertiesThrombus Aging +---------+---------------+---------+-----------+----------+--------------+ CFV      Full           Yes      Yes                                 +---------+---------------+---------+-----------+----------+--------------+ SFJ      Full                                                        +---------+---------------+---------+-----------+----------+--------------+ FV Prox  Full                                                        +---------+---------------+---------+-----------+----------+--------------+ FV Mid   Full                                                        +---------+---------------+---------+-----------+----------+--------------+ FV DistalFull                                                        +---------+---------------+---------+-----------+----------+--------------+ PFV      Full                                                        +---------+---------------+---------+-----------+----------+--------------+  POP      Full           Yes      Yes                                  +---------+---------------+---------+-----------+----------+--------------+ PTV      Full                                                        +---------+---------------+---------+-----------+----------+--------------+ PERO     Full                                                        +---------+---------------+---------+-----------+----------+--------------+   +---------+---------------+---------+-----------+----------+--------------+ LEFT     CompressibilityPhasicitySpontaneityPropertiesThrombus Aging +---------+---------------+---------+-----------+----------+--------------+ CFV      Full           Yes      Yes                                 +---------+---------------+---------+-----------+----------+--------------+ SFJ      Full                                                        +---------+---------------+---------+-----------+----------+--------------+ FV Prox  Full                                                        +---------+---------------+---------+-----------+----------+--------------+ FV Mid   Full                                                        +---------+---------------+---------+-----------+----------+--------------+ FV DistalFull                                                        +---------+---------------+---------+-----------+----------+--------------+ PFV      Full                                                        +---------+---------------+---------+-----------+----------+--------------+ POP      Full           Yes      Yes                                 +---------+---------------+---------+-----------+----------+--------------+  PTV      Full                                                        +---------+---------------+---------+-----------+----------+--------------+ PERO     Full                                                         +---------+---------------+---------+-----------+----------+--------------+     Summary: Right: Findings appear essentially unchanged compared to previous examination. There is no evidence of deep vein thrombosis in the lower extremity. Left: Findings appear essentially unchanged compared to previous examination. There is no evidence of deep vein thrombosis in the lower extremity.  *See table(s) above for measurements and observations.    Preliminary    Medications:  . aspirin EC  81 mg Oral Daily  . atorvastatin  40 mg Oral q1800  . budesonide (PULMICORT) nebulizer solution  0.5 mg Nebulization BID  . chlorhexidine  15 mL Mouth/Throat BID  . Chlorhexidine Gluconate Cloth  6 each Topical Daily  . darbepoetin (ARANESP) injection - DIALYSIS  150 mcg Intravenous Q Sat-HD  . feeding supplement (NEPRO CARB STEADY)  237 mL Oral TID BM  . feeding supplement (PRO-STAT SUGAR FREE 64)  30 mL Oral BID  . heparin  5,000 Units Subcutaneous Q8H  . magic mouthwash w/lidocaine  5 mL Oral QID  . multivitamin  1 tablet Oral QHS  . pantoprazole  40 mg Oral Q1200  . ramelteon  8 mg Oral QHS  . sucralfate  1 g Oral Q6H

## 2019-10-07 ENCOUNTER — Inpatient Hospital Stay (HOSPITAL_COMMUNITY): Payer: Medicare HMO

## 2019-10-07 ENCOUNTER — Inpatient Hospital Stay (HOSPITAL_COMMUNITY): Payer: Medicare HMO | Admitting: Occupational Therapy

## 2019-10-07 LAB — CBC
HCT: 27.1 % — ABNORMAL LOW (ref 39.0–52.0)
Hemoglobin: 8.2 g/dL — ABNORMAL LOW (ref 13.0–17.0)
MCH: 28.6 pg (ref 26.0–34.0)
MCHC: 30.3 g/dL (ref 30.0–36.0)
MCV: 94.4 fL (ref 80.0–100.0)
Platelets: 339 10*3/uL (ref 150–400)
RBC: 2.87 MIL/uL — ABNORMAL LOW (ref 4.22–5.81)
RDW: 15.8 % — ABNORMAL HIGH (ref 11.5–15.5)
WBC: 12.5 10*3/uL — ABNORMAL HIGH (ref 4.0–10.5)
nRBC: 0.4 % — ABNORMAL HIGH (ref 0.0–0.2)

## 2019-10-07 LAB — RENAL FUNCTION PANEL
Albumin: 2.2 g/dL — ABNORMAL LOW (ref 3.5–5.0)
Anion gap: 11 (ref 5–15)
BUN: 50 mg/dL — ABNORMAL HIGH (ref 8–23)
CO2: 27 mmol/L (ref 22–32)
Calcium: 9.7 mg/dL (ref 8.9–10.3)
Chloride: 97 mmol/L — ABNORMAL LOW (ref 98–111)
Creatinine, Ser: 5.64 mg/dL — ABNORMAL HIGH (ref 0.61–1.24)
GFR calc Af Amer: 11 mL/min — ABNORMAL LOW (ref >60)
GFR calc non Af Amer: 10 mL/min — ABNORMAL LOW (ref >60)
Glucose, Bld: 139 mg/dL — ABNORMAL HIGH (ref 70–99)
Phosphorus: 3.3 mg/dL (ref 2.5–4.6)
Potassium: 3.2 mmol/L — ABNORMAL LOW (ref 3.5–5.1)
Sodium: 135 mmol/L (ref 135–145)

## 2019-10-07 MED ORDER — SENNOSIDES-DOCUSATE SODIUM 8.6-50 MG PO TABS
2.0000 | ORAL_TABLET | Freq: Two times a day (BID) | ORAL | Status: DC
  Administered 2019-10-07 – 2019-10-15 (×13): 2 via ORAL
  Administered 2019-10-15: 1 via ORAL
  Administered 2019-10-16 – 2019-11-06 (×35): 2 via ORAL
  Filled 2019-10-07 (×57): qty 2

## 2019-10-07 MED ORDER — HEPARIN SODIUM (PORCINE) 1000 UNIT/ML DIALYSIS: 1000.0000 [IU] | INTRAMUSCULAR | Status: DC | PRN

## 2019-10-07 MED ORDER — HEPARIN SODIUM (PORCINE) 1000 UNIT/ML IJ SOLN
INTRAMUSCULAR | Status: AC
  Filled 2019-10-07: qty 4

## 2019-10-07 MED ORDER — PENTAFLUOROPROP-TETRAFLUOROETH EX AERO: 1.0000 "application " | INHALATION_SPRAY | CUTANEOUS | Status: DC | PRN

## 2019-10-07 MED ORDER — LIDOCAINE-PRILOCAINE 2.5-2.5 % EX CREA: 1.0000 "application " | TOPICAL_CREAM | CUTANEOUS | Status: DC | PRN

## 2019-10-07 MED ORDER — SODIUM CHLORIDE 0.9 % IV SOLN: 100.0000 mL | INTRAVENOUS | Status: DC | PRN

## 2019-10-07 MED ORDER — ALTEPLASE 2 MG IJ SOLR: 2.0000 mg | Freq: Once | INTRAMUSCULAR | Status: DC | PRN

## 2019-10-07 MED ORDER — LIDOCAINE HCL (PF) 1 % IJ SOLN: 5.0000 mL | INTRAMUSCULAR | Status: DC | PRN

## 2019-10-07 MED ORDER — CHLORHEXIDINE GLUCONATE CLOTH 2 % EX PADS
6.0000 | MEDICATED_PAD | Freq: Two times a day (BID) | CUTANEOUS | Status: DC
  Administered 2019-10-07 – 2019-10-08 (×2): 6 via TOPICAL

## 2019-10-07 NOTE — Progress Notes (Signed)
[place] PHYSICAL MEDICINE & REHABILITATION PROGRESS NOTE   Subjective/Complaints:  Occ incont of urine at noc, feels constipated, no breathing difficulties  Great toe pain improved   ROS: Patient denies , nausea, vomiting, diarrhea, cough, shortness of breath or chest pain, joint or back pain,    Objective:   VAS [place] LOWER EXTREMITY VENOUS (DVT)  Result Date: 10/05/2019  Lower Venous Study Indications: Rehab.  Comparison Study: Prior study from 07/31/19 is available for comparison Performing Technologist: Sharion Dove RVS  Examination Guidelines: A complete evaluation includes B-mode imaging, spectral Doppler, color Doppler, and power Doppler as needed of all accessible portions of each vessel. Bilateral testing is considered an integral part of a complete examination. Limited examinations for reoccurring indications may be performed as noted.  +---------+---------------+---------+-----------+----------+--------------+ RIGHT    CompressibilityPhasicitySpontaneityPropertiesThrombus Aging +---------+---------------+---------+-----------+----------+--------------+ CFV      Full           Yes      Yes                                 +---------+---------------+---------+-----------+----------+--------------+ SFJ      Full                                                        +---------+---------------+---------+-----------+----------+--------------+ FV Prox  Full                                                        +---------+---------------+---------+-----------+----------+--------------+ FV Mid   Full                                                        +---------+---------------+---------+-----------+----------+--------------+ FV DistalFull                                                        +---------+---------------+---------+-----------+----------+--------------+ PFV      Full                                                         +---------+---------------+---------+-----------+----------+--------------+ POP      Full           Yes      Yes                                 +---------+---------------+---------+-----------+----------+--------------+ PTV      Full                                                        +---------+---------------+---------+-----------+----------+--------------+  PERO     Full                                                        +---------+---------------+---------+-----------+----------+--------------+   +---------+---------------+---------+-----------+----------+--------------+ LEFT     CompressibilityPhasicitySpontaneityPropertiesThrombus Aging +---------+---------------+---------+-----------+----------+--------------+ CFV      Full           Yes      Yes                                 +---------+---------------+---------+-----------+----------+--------------+ SFJ      Full                                                        +---------+---------------+---------+-----------+----------+--------------+ FV Prox  Full                                                        +---------+---------------+---------+-----------+----------+--------------+ FV Mid   Full                                                        +---------+---------------+---------+-----------+----------+--------------+ FV DistalFull                                                        +---------+---------------+---------+-----------+----------+--------------+ PFV      Full                                                        +---------+---------------+---------+-----------+----------+--------------+ POP      Full           Yes      Yes                                 +---------+---------------+---------+-----------+----------+--------------+ PTV      Full                                                         +---------+---------------+---------+-----------+----------+--------------+ PERO     Full                                                        +---------+---------------+---------+-----------+----------+--------------+  Summary: Right: Findings appear essentially unchanged compared to previous examination. There is no evidence of deep vein thrombosis in the lower extremity. Left: Findings appear essentially unchanged compared to previous examination. There is no evidence of deep vein thrombosis in the lower extremity.  *See table(s) above for measurements and observations.    Preliminary    Recent Labs    10/06/19 0650  WBC 11.0*  HGB 7.6*  HCT 25.7*  PLT 307   Recent Labs    10/06/19 0650  NA 136  K 3.5  CL 100  CO2 28  GLUCOSE 87  BUN 33*  CREATININE 4.55*  CALCIUM 9.6    Intake/Output Summary (Last 24 hours) at 10/07/2019 0746 Last data filed at 10/07/2019 0516 Gross per 24 hour  Intake 740 ml  Output 226 ml  Net 514 ml     Physical Exam: Vital Signs Blood pressure 139/81, pulse 94, temperature 99.1 F (37.3 C), resp. rate 16, height 5\' 10"  (1.778 m), weight 87 kg, SpO2 100 %. Constitutional: No distress . Vital signs reviewed. HEENT: EOMI, oral membranes moist, thrush Neck: supple Cardiovascular: RRR without murmur. No JVD    Respiratory: CTA Bilaterally without wheezes or rales. Normal effort    GI: BS +, non-tender, non-distended  Ext: no edema Skin:Right foot no lesions or skin discoloration  Musculoskeletal: 4/5 strength throughout--stable, no pain with Righ tgreat toe ROM, no erythema  Psych: pleasant, normal affect Neurological: normal insight and awareness    Assessment/Plan: 1. Functional deficits secondary to debility after multiple medical which require 3+ hours per day of interdisciplinary therapy in a comprehensive inpatient rehab setting.  Physiatrist is providing close team supervision and 24 hour management of active medical problems  listed below.  Physiatrist and rehab team continue to assess barriers to discharge/monitor patient progress toward functional and medical goals  Care Tool:  Bathing    Body parts bathed by patient: Chest, Abdomen, Right upper leg, Left upper leg, Face   Body parts bathed by helper: Right arm, Left arm, Front perineal area, Buttocks, Right lower leg, Left lower leg     Bathing assist Assist Level: 2 Helpers     Upper Body Dressing/Undressing Upper body dressing   What is the patient wearing?: Pull over shirt    Upper body assist Assist Level: Moderate Assistance - Patient 50 - 74%    Lower Body Dressing/Undressing Lower body dressing      What is the patient wearing?: Incontinence brief     Lower body assist Assist for lower body dressing: Total Assistance - Patient < 25%     Toileting Toileting Toileting Activity did not occur (Clothing management and hygiene only): N/A (no void or bm)  Toileting assist Assist for toileting: Total Assistance - Patient < 25% Assistive Device Comment: urinal    Transfers Chair/bed transfer  Transfers assist  Chair/bed transfer activity did not occur: Safety/medical concerns  Chair/bed transfer assist level: Dependent - mechanical lift     Locomotion Ambulation   Ambulation assist   Ambulation activity did not occur: Safety/medical concerns          Walk 10 feet activity   Assist  Walk 10 feet activity did not occur: Safety/medical concerns        Walk 50 feet activity   Assist Walk 50 feet with 2 turns activity did not occur: Safety/medical concerns         Walk 150 feet activity   Assist Walk 150 feet activity did not occur: Safety/medical  concerns         Walk 10 feet on uneven surface  activity   Assist Walk 10 feet on uneven surfaces activity did not occur: Safety/medical concerns         Wheelchair     Assist   Type of Wheelchair: Manual    Wheelchair assist level: Dependent -  Patient 0%(TIS WC) Max wheelchair distance: 150    Wheelchair 50 feet with 2 turns activity    Assist        Assist Level: Dependent - Patient 0%   Wheelchair 150 feet activity     Assist      Assist Level: Dependent - Patient 0%   Blood pressure 139/81, pulse 94, temperature 99.1 F (37.3 C), resp. rate 16, height 5\' 10"  (1.778 m), weight 87 kg, SpO2 100 %.  Medical Problem List and Plan: 1.  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             -patient may shower             -ELOS/Goals: modI PT, OT, SLP, team conf in am   -Patient is beginning CIR therapies today including PT, OT, and SLP  2.  Antithrombotics: -DVT/anticoagulation: Subcutaneous heparin. Negative  vascular study             -antiplatelet therapy: Aspirin 81 mg daily 3. Pain Management: Hycet as needed, Right great toe pain,improved cont  prevalon boots  4. Mood: Provide emotional support. Rozerem 8 mg nightly             -antipsychotic agents: N/A 5. Neuropsych: This patient is capable of making decisions on his own behalf.             SLP recommends additional diagnostics of higher level cognitive skills.  6. Skin/Wound Care: Wound care sacral decubitus with tunneling.   -pack with iodoform gauze bid  -woc rn consulted  -nutrition, turning, prevalon boots for heels- needs order to place qhs  7. Fluids/Electrolytes/Nutrition: Routine in and outs with follow-up chemistries 8.  Anemia/melena.  Follow-up CBC.  Continue Aranesp, Hgb fluctuating 7.6-8.4g 9.  Tracheostomy 08/22/2019.  Decannulated.  Diet advanced to regular.  Continue nebulizers as directed 10.  MSSA bacteremia/pneumonia.  Antibiotic therapy completed 11.  Hyperlipidemia.  Lipitor 12. Oropharyngeal dysphagia: Appreciate speech therapy recommendations; regular  diet with oral care BID  -MMW for thrush    LOS: 4 days A FACE TO FACE EVALUATION WAS PERFORMED  Charlett Blake 10/07/2019, 7:46 AM

## 2019-10-07 NOTE — Progress Notes (Signed)
Speech Language Pathology Daily Session Note  Patient Details  Name: [name] MRN: [id number] Date of Birth: 1951-09-17  Today's Date: 10/07/2019 SLP Individual Time: 1000-1057 SLP Individual Time Calculation (min): 57 min  Short Term Goals: Week 1: SLP Short Term Goal 1 (Week 1): Patient will complete higher level reasoning and executive functioning tasks (ex: med and money management) with 90% accuracy and min cues. SLP Short Term Goal 2 (Week 1): Patient will state and demonstrate compensatory cognitive-linguistic strategies as trained with 90% accuracy and min cues. SLP Short Term Goal 3 (Week 1): Patient will complete delayed recall tasks for 5 stimuil post ~5 minute delay with 80% accuracy and min cues.  Skilled Therapeutic Interventions: Skilled ST services focused on education and cognitive skills. Pt demonstrated recall of yesterday's SLP task mod I and recall function/times per day of medications with visual aid given supervision A verbal cues. SLP facilitated complex problem solving skills with familiar medication management task, pt required supervision A verbal cues for error awareness. SLP instructed pt in memory strategies WRAP to aid in recall of medication name/function/times per day, pt demonstrated recall of 2 out 5 medications on first trial utilizing visualization strategy and on second trial 4 out 5 with mod A verbal cues for use of association strategy. Pt's wife enter room in the lasted 5 minutes of treatment, SLP provided education on current goals, and WRAP strategies for recall. Pt's wife stated no noted changes in pt's cognitive skills during interactions within hospital setting and expressed managing appointments along with money at home. All questions answered to satisfaction. Pt was left in room with wife, call bell within reach and chair alarm set. ST recommends to continue skilled ST services.   Pain Pain Assessment Pain Scale: 0-10 Pain Score: 0-No  pain  Therapy/Group: Individual Therapy  Jekhi Bolin  [facility] 10/07/2019, 12:13 PM

## 2019-10-07 NOTE — Progress Notes (Signed)
[facility] Progress Note   Dialysis Orders:  Dialysis dependent AKI  presumed progressed to  ESRD   - outpatient HD has been arranged at [facility] 12:30 pm after d/c pending no change  Assessment/Plan:  1.S/p acute hypoxemia and respiratory failure due to COVID 51:WCHENIDP complications/ secondary infections, prolonged ICU course. R chest tubeout,trach removed 11/30. Transferred to CIR 12/11 2. [place] near 2 month acute admission for COVID/PNA/hypoixc resp failure now on [facility] -Admitted 10/3 and started CRRT on 08/07/19, has been on RRT for a little under 2 mos now. Patient prefers to avoid perm access for now hoping that renal function will recover. outpatient HD set up at [facility]. Foley out - will check labs  pre HD Tuesday - generally requiring added K bath 3.HTN/volume:BPvariable- net UF 1 L 12/12  4. Anemia:Hgb7.7> 8.4> 7.6 HxGI bleed this admission. No heparin with HD.S/pIV iron. Continue Aranesp191mcg q Sat.- tsat 17% 11/29 - on carafate tsat drawn after 3 doses ferrlicit (824 mg given) - follow - transfuse prn - Making some urine - no labs Saturday but Cr 4.5 12/14 - continue to trend - repeat Pre HD  5. Secondary hyperparathyroidism:CorrCa slightly high. Not on calcium or vit D, use low Ca bath. Phos nowatgoal.iPTH ordered - not checked with Sat HD -check with HD today  6.MPN:TIRWERXVQMGQQ cortex and right cerebellum small acute to subacuteinfarctswithneuromuscular weakness/encephalopathy. 7. Nutrition: Alb very low - post COVID anorexia due to lack of taste. Addednepro + pro-stat supplements. Diet liberalized 8.Sacral decub: Pressure ulcer - wound care following. 9. Debility - per CIR 10. Code status DNR  Myriam Jacobson, PA-C [facility] Beeper [phone] 10/07/2019,11:30 AM  LOS: 4 days   Subjective:   Food has very little taste.  Eating small amounts of lunch. Hurts to ride in un inflated  bed on the way to dialysis Objective Vitals:   10/06/19 1943 10/07/19 0517 10/07/19 0518 10/07/19 0806  BP:   139/81   Pulse: 90  94   Resp: 18  16   Temp:   99.1 F (37.3 C)   TempSrc:      SpO2:   100% 100%  Weight:  87 kg    Height:       Physical Exam General: NAD breathing easily Heart: RRR Lungs: no rales Abdomen: soft NT Extremities: no LE edema Dialysis Access: left IJ [facility]  Additional Objective Labs: Basic Metabolic Panel: Recent Labs  Lab 10/02/19 0800 10/06/19 0650  NA 133* 136  K 3.9 3.5  CL 95* 100  CO2 28 28  GLUCOSE 104* 87  BUN 33* 33*  CREATININE 5.04* 4.55*  CALCIUM 9.3 9.6  PHOS 3.0  --    Liver Function Tests: Recent Labs  Lab 10/02/19 0800  ALBUMIN 2.0*   No results for input(s): LIPASE, AMYLASE in the last 168 hours. CBC: Recent Labs  Lab 10/01/19 0511 10/02/19 0802 10/03/19 0555 10/06/19 0650  WBC 13.6* 13.4* 11.3* 11.0*  HGB 8.3* 7.7* 8.4* 7.6*  HCT 26.9* 25.0* 27.5* 25.7*  MCV 91.8 92.6 92.6 93.1  PLT 350 362 281 307   Blood Culture    Component Value Date/Time   SDES BLOOD LEFT HAND 09/22/2019 0917   SPECREQUEST AEROBIC BOTTLE ONLY Blood Culture adequate volume 09/22/2019 0917   CULT  09/22/2019 0917    NO GROWTH 5 DAYS Performed at [facility], [place] [address] [place], [place] [address]    REPTSTATUS 09/27/2019 FINAL 09/22/2019 2458    Cardiac  Enzymes: No results for input(s): CKTOTAL, CKMB, CKMBINDEX, TROPONINI in the last 168 hours. CBG: Recent Labs  Lab 10/02/19 1142 10/02/19 1719 10/02/19 2105 10/03/19 0715 10/03/19 1122  GLUCAP 92 86 125* 79 95   Iron Studies: No results for input(s): IRON, TIBC, TRANSFERRIN, FERRITIN in the last 72 hours. Lab Results  Component Value Date   INR 1.2 09/02/2019   INR 1.0 08/22/2019   Studies/Results: No results found. Medications:  . aspirin EC  81 mg Oral Daily  . atorvastatin  40 mg Oral q1800  . budesonide (PULMICORT) nebulizer solution  0.5 mg  Nebulization BID  . chlorhexidine  15 mL Mouth/Throat BID  . Chlorhexidine Gluconate Cloth  6 each Topical Q0600  . darbepoetin (ARANESP) injection - DIALYSIS  150 mcg Intravenous Q Sat-HD  . feeding supplement (NEPRO CARB STEADY)  237 mL Oral TID BM  . feeding supplement (PRO-STAT SUGAR FREE 64)  30 mL Oral BID  . heparin  5,000 Units Subcutaneous Q8H  . magic mouthwash w/lidocaine  5 mL Oral QID  . multivitamin  1 tablet Oral QHS  . pantoprazole  40 mg Oral Q1200  . ramelteon  8 mg Oral QHS  . senna-docusate  2 tablet Oral BID  . sucralfate  1 g Oral Q6H

## 2019-10-07 NOTE — Progress Notes (Signed)
Physical Therapy Session Note  Patient Details  Name: [name] MRN: [id number] Date of Birth: [birth year]  Today's Date: 10/07/2019 PT Individual Time: 0803-0900 PT Individual Time Calculation (min): 57 min   Short Term Goals: Week 1:  PT Short Term Goal 1 (Week 1): Pt will transfer to and from [facility] with max assist +2 and LRAD PT Short Term Goal 2 (Week 1): Pt will perform bed mobility with max assist of 1 PT Short Term Goal 3 (Week 1): Pt will maintain sitting balance for up to 5 mintues with mod  assist PT Short Term Goal 4 (Week 1): PT will perform sit<>stand with max assist +2 for BLE strengthening PT Short Term Goal 5 (Week 1): Pt will tolerate 5 min on tilt table for functional WB through BLE  Skilled Therapeutic Interventions/Progress Updates: Pt presented in bed completing breakfast and agreeable to therapy. Pt performed rolling L/R modA to R, maxA to L to thread and pull up pants. Pt then performed supine to sit maxA in preparation for SB transfer. Pt initially required modA for sitting balance but was able to progress to CGA. Pt then performed SB transfer maxA x2 to [place]. Pt transported to rehab gym and attempted to perform x2 STS from TIS via three musketeers technique. Pt was able to initiate pushing through RLE however was unable to tolerate a full stand.Pt then transported to hallway and attempted to perform stand at wall rail. Pt again was able to initiate and bear weight through RLE however was unable to come to full stand. Once completed pt returned to room and stated mild HA after activity. Vitals checked and BP 148/87 (103) SpO2 96%. Discussed that pt is very deconditioned and required significant amout of effort for activities performed this am. Pt verbalized understanding. Pt agreeable to remain in w/c at end of session. Pt left with belt alarm on, call bell within reach and nsg present to administer meds. Advised nsg that pt maintained SpO2>95% during session on RA and  agreeable to keep off supplemental O2 while at rest.      Therapy Documentation Precautions:  Precautions Precautions: Fall Precaution Comments: Sacral decubitus wounds, Lt hemi Restrictions Weight Bearing Restrictions: No General:   Vital Signs:   Pain: Pain Assessment Pain Scale: 0-10 Pain Score: 0-No pain    Therapy/Group: Individual Therapy  Damione Robideau  Alford Gamero, PTA  10/07/2019, 12:46 PM

## 2019-10-07 NOTE — Progress Notes (Signed)
Occupational Therapy Session Note  Patient Details  Name: [name] MRN: [id number] Date of Birth: 07-15-1951  Today's Date: 10/07/2019 OT Individual Time: 1683-7290 OT Individual Time Calculation (min): 62 min    Short Term Goals: Week 1:  OT Short Term Goal 1 (Week 1): Pt will complete toilet or BSC transfer with 2 assist for OOB toileting OT Short Term Goal 2 (Week 1): Pt will complete 1 grooming task w/c level at sink with mod vcs to include Lt limb OT Short Term Goal 3 (Week 1): Pt will don overhead shirt and initiate threading L UE first  Skilled Therapeutic Interventions/Progress Updates:    Pt completed bathing tasks during session at the sink and in supine rolling.  He was up in the tilt in space wheelchair to start the session.  Took him over to the sink where he engaged in UB bathing task.  Had him incorporate the LUE as a stabilizer with min assist to apply soap to the washcloth.  Then gave hand over hand for washing the RUE with the left arm.  Noted some trace elbow and shoulder movements in the LUE with attempted use.  He was able to wash his left arm, chest, and abdomen using the RUE with setup help.  He then completed sliding board transfer to the bed from the wheelchair with total +2 (pt 25%).  Total +2 (pt 20%) for sit to supine as well.  Worked on washing peri area and donning new brief in supine rolling with overall total assist.  He needed max assist for rolling to the right and sequencing the task as well as min assist for rolling to the left.  He was able to wash his upper legs and front peri area with setup and HOB elevated.  Therapist assisted with washing his lower legs and feet as well as applying lotion to them and new gripper socks.  Pt left in bed at end of session with call button and phone in reach and spouse present.  Nursing in room as well..   Therapy Documentation Precautions:  Precautions Precautions: Fall Precaution Comments: Sacral decubitus wounds,  Lt hemi Restrictions Weight Bearing Restrictions: No   Pain: Pain Assessment Pain Scale: Faces Pain Score: 0-No pain Faces Pain Scale: Hurts a little bit Pain Type: Chronic pain Pain Location: Knee Pain Orientation: Left Pain Descriptors / Indicators: Grimacing Pain Onset: With Activity Pain Intervention(s): Repositioned ADL: See Care Tool Section for some details of mobility and selfcare  Therapy/Group: Individual Therapy  Norvell Caswell OTR/L 10/07/2019, 12:47 PM

## 2019-10-08 ENCOUNTER — Inpatient Hospital Stay (HOSPITAL_COMMUNITY): Payer: Medicare HMO

## 2019-10-08 ENCOUNTER — Inpatient Hospital Stay (HOSPITAL_COMMUNITY): Payer: Medicare HMO | Admitting: Physical Therapy

## 2019-10-08 DIAGNOSIS — I63039 Cerebral infarction due to thrombosis of unspecified carotid artery: Secondary | ICD-10-CM

## 2019-10-08 LAB — PARATHYROID HORMONE, INTACT (NO CA): PTH: 15 pg/mL (ref 15–65)

## 2019-10-08 LAB — PTH, INTACT AND CALCIUM
Calcium, Total (PTH): 9.2 mg/dL (ref 8.6–10.2)
PTH: 28 pg/mL (ref 15–65)

## 2019-10-08 MED ORDER — CHLORHEXIDINE GLUCONATE CLOTH 2 % EX PADS
6.0000 | MEDICATED_PAD | Freq: Every day | CUTANEOUS | Status: DC
  Administered 2019-10-10: 6 via TOPICAL

## 2019-10-08 NOTE — Progress Notes (Signed)
Physical Therapy Session Note  Patient Details  Name: [name] MRN: [id number] Date of Birth: 07-Aug-1951  Today's Date: 10/08/2019 PT Individual Time: 1420-1530 PT Individual Time Calculation (min): 70 min   Short Term Goals: Week 1:  PT Short Term Goal 1 (Week 1): Pt will transfer to and from [facility] with max assist +2 and LRAD PT Short Term Goal 2 (Week 1): Pt will perform bed mobility with max assist of 1 PT Short Term Goal 3 (Week 1): Pt will maintain sitting balance for up to 5 mintues with mod  assist PT Short Term Goal 4 (Week 1): PT will perform sit<>stand with max assist +2 for BLE strengthening PT Short Term Goal 5 (Week 1): Pt will tolerate 5 min on tilt table for functional WB through BLE  Skilled Therapeutic Interventions/Progress Updates: Pt presented in  TIS agreeable to therapy. Pt stating some "soreness" at "tailbone" but declines intervention. Pt transported to rehab gym and performed SB transfer to mat maxA x 2. Pt was able to initiate and lean forward to facilitate. Pt initially requires modA for sitting balance but was able to progress to SBA with RUE support. Pt participated in sitting balance activities including reaching for cups on blue bench and with multimodal cues to use core ms to return to midline vs push up with RUE. Pt with improved stability reaching to R for cones and crossing midline to place on L. Second PT present providing minA for correction with forward leans and approximation to L shoulder. Pt also participated in reaching and placing clothespins from table tray to crossbar. PTA required mod cues for controlled movement vs doing quickly to maintain balance. Pt was able to tolerate unsupported seated activities for approx 10 min before requiring rest. Pt then participated in standing frame with PTA explaining set up and purpose. Pt was able to tolerate x2 standing for approx 1 min ea before pt with c/o SOB. Vitals checked  SpO2 >95% HR 120s. Pt stating  feeling resolved with seated rest. When pt was able to tolerate standing frame pt was able to maintain mostly erect posture and was able to bear wt through LLE. Once completed pt returned to TIS via SB transfer to R with maxA x2. Pt transported to day room and participated in cybex Kinetron 80cm/sec from TIS for reciprocal activity 3 bouts 10 cycles. Pt transported back to room and performed SB transfer maxA x 2. Pt transferred sit to supine total A x1 and was repositioned to comfort. Pt left in bed with call bell within reach and nsg arriving to perform dsg change.      Therapy Documentation Precautions:  Precautions Precautions: Fall Precaution Comments: Sacral decubitus wounds, Lt hemi Restrictions Weight Bearing Restrictions: No General:   Vital Signs: Therapy Vitals Temp: 98.4 F (36.9 C) Temp Source: Oral Pulse Rate: (!) 109 Resp: 16 BP: 138/85 Patient Position (if appropriate): Lying Oxygen Therapy SpO2: 100 % O2 Device: Room Air   Therapy/Group: Individual Therapy  [name]  [name], PTA  10/08/2019, 4:01 PM

## 2019-10-08 NOTE — Progress Notes (Signed)
Social Work Patient ID: [name], male   DOB: 1951-08-17, 68 y.o.   MRN: [id number]  Met with pt and spoke with wife via telephone to discuss team conference goals min-mod level and target discharge date 10/30/2019. Wife hopes he does better than this what the therapist goals are. Discussed her coming in for therapies and seeing his level and progress as he stays here. She will try to do this. Work on discharge needs.

## 2019-10-08 NOTE — Progress Notes (Signed)
[facility] Progress Note   Dialysis Orders:  Dialysis dependent AKI  presumed progressed to  ESRD   - outpatient HD has been arranged at [facility] [place] 12:30 pm after d/c pending no change  Assessment/Plan:  1.S/p acute hypoxemia and respiratory failure due to COVID 16:[id number] complications/ secondary infections, prolonged ICU course. R chest tubeout,trach removed 11/30. Transferred to CIR 12/11 2. [place] near 2 month acute admission for COVID/PNA/hypoixc resp failure now on TTSschedule -Admitted 10/3 and started CRRT on 08/07/19, has been on RRT for a little under 2 mos now. Patient prefers to avoid perm access for now hoping that renal function will recover. outpatient HD set up at [facility]. Foley out - generally requiring added K bath - Next HD Thursday Running 3.5 hours - no heparin due to GIB 3.HTN/volume:BPvariable- net UF 1 L 12/12 1L 12/15 > 81 kg post HD - mildly tachy - no BP meds  4. Anemia:Hgb7.7> 8.4> 7.6 > 8.2  HxGI bleed this admission. .S/pIV iron. Continue Aranesp135mcg q Sat.- tsat 17% 11/29 - on carafate tsat drawn after 3 doses ferrlicit (098 mg given) - follow - transfuse prn -   5. Secondary hyperparathyroidism:CorrCa slightly high. Not on calcium or vit D, use low Ca bath- limited to 2.25 due to need for 4 K . Phos nowatgoal.iPTH suppressed at 28  6.JXB:JYNWGNFAOZHYQ cortex and right cerebellum small acute to subacuteinfarctswithneuromuscular weakness/encephalopathy. 7. Nutrition: Alb very low - post COVID anorexia due to lack of taste. Addednepro + pro-stat supplements. Diet liberalized 8.Sacral decub: Pressure ulcer - wound care following. 9. Debility - per CIR 10. Code status DNR  Myriam Jacobson, PA-C [facility] Beeper [phone] 10/08/2019,12:34 PM  LOS: 5 days   Subjective:  Spaghetti has some flavor - ate about 1/3 so far. Denies issues on HD yesterday  Objective Vitals:    10/07/19 2008 10/07/19 2019 10/08/19 0531 10/08/19 0758  BP: (!) 141/86  (!) 153/89   Pulse: (!) 103  96   Resp: 18  18   Temp: 98.1 F (36.7 C)  98.2 F (36.8 C)   TempSrc: Oral     SpO2: 99% 99% 96% 98%  Weight:      Height:       Physical Exam General: NAD breathing easily Heart: RRR 90s Lungs: no rales Abdomen: soft NT Extremities: no LE edema Dialysis Access: left IJ [facility]  Additional Objective Labs: Basic Metabolic Panel: Recent Labs  Lab 10/02/19 0800 10/06/19 0650 10/07/19 1400 10/07/19 1422  NA 133* 136 135  --   K 3.9 3.5 3.2*  --   CL 95* 100 97*  --   CO2 28 28 27   --   GLUCOSE 104* 87 139*  --   BUN 33* 33* 50*  --   CREATININE 5.04* 4.55* 5.64*  --   CALCIUM 9.3 9.6 9.7 9.2  PHOS 3.0  --  3.3  --    Liver Function Tests: Recent Labs  Lab 10/02/19 0800 10/07/19 1400  ALBUMIN 2.0* 2.2*   No results for input(s): LIPASE, AMYLASE in the last 168 hours. CBC: Recent Labs  Lab 10/02/19 0802 10/03/19 0555 10/06/19 0650 10/07/19 1400  WBC 13.4* 11.3* 11.0* 12.5*  HGB 7.7* 8.4* 7.6* 8.2*  HCT 25.0* 27.5* 25.7* 27.1*  MCV 92.6 92.6 93.1 94.4  PLT 362 281 307 339   Blood Culture    Component Value Date/Time   SDES BLOOD LEFT HAND 09/22/2019 0917   SPECREQUEST AEROBIC BOTTLE ONLY Blood Culture  adequate volume 09/22/2019 0917   CULT  09/22/2019 0917    NO GROWTH 5 DAYS Performed at [facility], [place] [address] [place], [place] [address]    REPTSTATUS 09/27/2019 FINAL 09/22/2019 0630    Cardiac Enzymes: No results for input(s): CKTOTAL, CKMB, CKMBINDEX, TROPONINI in the last 168 hours. CBG: Recent Labs  Lab 10/02/19 1142 10/02/19 1719 10/02/19 2105 10/03/19 0715 10/03/19 1122  GLUCAP 92 86 125* 79 95   Iron Studies: No results for input(s): IRON, TIBC, TRANSFERRIN, FERRITIN in the last 72 hours. Lab Results  Component Value Date   INR 1.2 09/02/2019   INR 1.0 08/22/2019   Studies/Results: No results  found. Medications:  . aspirin EC  81 mg Oral Daily  . atorvastatin  40 mg Oral q1800  . budesonide (PULMICORT) nebulizer solution  0.5 mg Nebulization BID  . chlorhexidine  15 mL Mouth/Throat BID  . Chlorhexidine Gluconate Cloth  6 each Topical BID  . darbepoetin (ARANESP) injection - DIALYSIS  150 mcg Intravenous Q Sat-HD  . feeding supplement (NEPRO CARB STEADY)  237 mL Oral TID BM  . feeding supplement (PRO-STAT SUGAR FREE 64)  30 mL Oral BID  . heparin  5,000 Units Subcutaneous Q8H  . magic mouthwash w/lidocaine  5 mL Oral QID  . multivitamin  1 tablet Oral QHS  . pantoprazole  40 mg Oral Q1200  . ramelteon  8 mg Oral QHS  . senna-docusate  2 tablet Oral BID  . sucralfate  1 g Oral Q6H

## 2019-10-08 NOTE — Progress Notes (Signed)
[place] PHYSICAL MEDICINE & REHABILITATION PROGRESS NOTE   Subjective/Complaints:  Appreciate renal note   ROS: Patient denies , nausea, vomiting, diarrhea, cough, shortness of breath or chest pain, joint or back pain,    Objective:   No results found. Recent Labs    10/06/19 0650 10/07/19 1400  WBC 11.0* 12.5*  HGB 7.6* 8.2*  HCT 25.7* 27.1*  PLT 307 339   Recent Labs    10/06/19 0650 10/07/19 1400 10/07/19 1422  NA 136 135  --   K 3.5 3.2*  --   CL 100 97*  --   CO2 28 27  --   GLUCOSE 87 139*  --   BUN 33* 50*  --   CREATININE 4.55* 5.64*  --   CALCIUM 9.6 9.7 9.2    Intake/Output Summary (Last 24 hours) at 10/08/2019 0739 Last data filed at 10/08/2019 0648 Gross per 24 hour  Intake 360 ml  Output 1350 ml  Net -990 ml     Physical Exam: Vital Signs Blood pressure (!) 153/89, pulse 96, temperature 98.2 F (36.8 C), resp. rate 18, height 5' 10" (1.778 m), weight 81 kg, SpO2 96 %. Constitutional: No distress . Vital signs reviewed. HEENT: EOMI, oral membranes moist, thrush Neck: supple Cardiovascular: RRR without murmur. No JVD    Respiratory: CTA Bilaterally without wheezes or rales. Normal effort    GI: BS +, non-tender, non-distended  Ext: no edema Skin:Right foot no lesions or skin discoloration  MusculoskeletalRight side 4/5 in UE and LE, 3- LUE, 2- LLE hip/knee ext synergy , 0/5 at ankle and hip flexors  Psych: pleasant, normal affect Neurological: normal insight and awareness    Assessment/Plan: 1. Functional deficits secondary to Left hemiparesis from RIght parietal infarct   which require 3+ hours per day of interdisciplinary therapy in a comprehensive inpatient rehab setting.  Physiatrist is providing close team supervision and 24 hour management of active medical problems listed below.  Physiatrist and rehab team continue to assess barriers to discharge/monitor patient progress toward functional and medical goals  Care  Tool:  Bathing    Body parts bathed by patient: Chest, Abdomen, Front perineal area, Right upper leg, Left upper leg, Face   Body parts bathed by helper: Right arm, Left arm, Buttocks, Right lower leg, Left lower leg     Bathing assist Assist Level: Total Assistance - Patient < 25%(sitting in wheelchair for UB and in bed for LB)     Upper Body Dressing/Undressing Upper body dressing   What is the patient wearing?: Hospital gown only    Upper body assist Assist Level: Maximal Assistance - Patient 25 - 49%    Lower Body Dressing/Undressing Lower body dressing      What is the patient wearing?: Incontinence brief     Lower body assist Assist for lower body dressing: Total Assistance - Patient < 25%(supine rolling in bed)     Toileting Toileting Toileting Activity did not occur (Clothing management and hygiene only): N/A (no void or bm)  Toileting assist Assist for toileting: Total Assistance - Patient < 25% Assistive Device Comment: urinal    Transfers Chair/bed transfer  Transfers assist  Chair/bed transfer activity did not occur: Safety/medical concerns  Chair/bed transfer assist level: 2 Helpers(sliding board)     Locomotion Ambulation   Ambulation assist   Ambulation activity did not occur: Safety/medical concerns          Walk 10 feet activity   Assist  Walk 10 feet activity  did not occur: Safety/medical concerns        Walk 50 feet activity   Assist Walk 50 feet with 2 turns activity did not occur: Safety/medical concerns         Walk 150 feet activity   Assist Walk 150 feet activity did not occur: Safety/medical concerns         Walk 10 feet on uneven surface  activity   Assist Walk 10 feet on uneven surfaces activity did not occur: Safety/medical concerns         Wheelchair     Assist   Type of Wheelchair: Manual    Wheelchair assist level: Dependent - Patient 0%(TIS WC) Max wheelchair distance: 150     Wheelchair 50 feet with 2 turns activity    Assist        Assist Level: Dependent - Patient 0%   Wheelchair 150 feet activity     Assist      Assist Level: Dependent - Patient 0%   Blood pressure (!) 153/89, pulse 96, temperature 98.2 F (36.8 C), resp. rate 18, height 5' 10" (1.778 m), weight 81 kg, SpO2 96 %.  Medical Problem List and Plan: 1.  Debility secondary to acute hypoxemic RIght parietal infarct with Left hemiparesis LE>UE              -patient may shower             -ELOS/Goals: modI PT, OT, SLP, Team conference today please see physician documentation under team conference tab, met with team face-to-face to discuss problems,progress, and goals. Formulized individual treatment plan based on medical history, underlying problem and comorbidities.  -Patient is beginning CIR therapies today including PT, OT, and SLP  2.  Antithrombotics: -DVT/anticoagulation: Subcutaneous heparin. Negative  vascular study             -antiplatelet therapy: Aspirin 81 mg daily 3. Pain Management: Hycet as needed, Right great toe pain,improved cont  prevalon boots  4. Mood: Provide emotional support. Rozerem 8 mg nightly             -antipsychotic agents: N/A 5. Neuropsych: This patient is capable of making decisions on his own behalf.             SLP recommends additional diagnostics of higher level cognitive skills.  6. Skin/Wound Care: Wound care sacral decubitus with tunneling.   -pack with iodoform gauze bid  -woc rn consulted  -nutrition, turning, prevalon boots for heels- needs order to place qhs  7. Fluids/Electrolytes/Nutrition: Routine in and outs with follow-up chemistries 8.  Anemia/melena.  Follow-up CBC.  Continue Aranesp, Hgb fluctuating 7.6-8.4g 9.  Tracheostomy 08/22/2019.  Decannulated.  Diet advanced to regular.  Continue nebulizers as directed 10.  MSSA bacteremia/pneumonia.  Antibiotic therapy completed 11.  Hyperlipidemia.  Lipitor 12. Oropharyngeal  dysphagia: Appreciate speech therapy recommendations; regular diet with oral care BID  -MMW for thrush    LOS: 5 days A FACE TO FACE EVALUATION WAS PERFORMED   E  10/08/2019, 7:39 AM     

## 2019-10-08 NOTE — Patient Care Conference (Signed)
Inpatient RehabilitationTeam Conference and Plan of Care Update Date: 10/08/2019   Time: 10:00 AM    Patient Name: [name]      Medical Record Number: [id number]  Date of Birth: Nov 12, 1950 Sex: Male         Room/Bed: 4W03C/4W03C-01 Payor Info: Payor: [employer] MEDICARE / Plan: [place] HMO / Product Type: *No Product type* /    Admit Date/Time:  10/03/2019  2:27 PM  Primary Diagnosis:  Debility  Patient Active Problem List   Diagnosis Date Noted  . Oropharyngeal dysphagia 10/03/2019  . Debility 10/03/2019  . Acute blood loss anemia 09/06/2019  . H. pylori infection 09/06/2019  . CVA (cerebral vascular accident) ([place]) 09/06/2019  . Anoxic brain injury ([place]) 09/06/2019  . MSSA bacteremia 09/06/2019  . Positive D dimer 09/06/2019  . Pseudomonas infection 09/06/2019  . Infection due to acinetobacter baumannii 09/06/2019  . Sinus tachycardia 09/05/2019  . Advanced care planning/counseling discussion   . Goals of care, counseling/discussion   . Palliative care by specialist   . Cerebral thrombosis with cerebral infarction 08/28/2019  . Tracheostomy status ([place])   . Acute respiratory distress syndrome (ARDS) due to COVID-19 virus ([place])   . Chest tube in place   . Primary spontaneous pneumothorax   . Acute respiratory failure ([place])   . Gastric ulcer with hemorrhage 08/10/2019  . Constipation 08/09/2019  . Atrial fibrillation with RVR ([place]) 08/07/2019  . Acute renal failure (ARF) ([place]) 08/06/2019  . GI bleed 08/06/2019  . Pneumothorax on right   . Fluid overload 07/27/2019  . Pneumonia due to COVID-19 virus 07/25/2019  . Acute respiratory failure due to COVID-19 ([place]) 07/25/2019  . AKI (acute kidney injury) ([place]) 07/25/2019  . Dyspepsia 11/26/2014  . BPH (benign prostatic hyperplasia) 10/31/2013  . Achilles rupture 04/05/2011  . S/P Achilles tendon repair 04/05/2011    Expected Discharge Date: Expected Discharge Date: 10/30/19  Team Members Present: Physician  leading conference: Dr. Alysia Penna Social Worker Present: Ovidio Kin, LCSW Nurse Present: Romilda Garret, LPN Case Manager: Karene Fry, RN PT Present: Barrie Folk, PT;Rosita Dechalus, PTA OT Present: Willeen Cass, OT SLP Present: Charolett Bumpers, SLP PPS Coordinator present : Gunnar Fusi, SLP     Current Status/Progress Goal Weekly Team Focus  Bowel/Bladder   Patient is continent of bladder/bowel, LBM 10/07/19  Maintain continence  Assess QS/PRN toileting needs   Swallow/Nutrition/ Hydration             ADL's   2 assist for self care tasks EOB + bedlevel, Maximove transfers  Min A overall  NMR, ADL retraining, sitting balance, trunk control, functional transfers   Mobility   modA rolling to L, maxA to R, maxA supine to sit, maxA x 2 SB transfer, sitting balance modA to SBA  minA overall  bed mobilty, sitting balance, OOB tolerance, d/c planning   Communication             Safety/Cognition/ Behavioral Observations  Mod-Min A  Supervision A  complex problem solving/executive function (completed medication management) and recall strategies   Pain   Denies pain occassional left knee discomfort  , 2  Assess QS/PRN   Skin   Unstageable to coccyx (unable to visualize wound bed  Tunneling present,,with packing to area as ordered has purulent drainage no odor  Prevent infection  Assess QS/PRN,,WOC consult with dressing changes, HD cath to left upper chest, old stoma from trach site healing well      *See Care Plan and progress notes  for long and short-term goals.     Barriers to Discharge  Current Status/Progress Possible Resolutions Date Resolved   Nursing                  PT                    OT                  SLP                SW                Discharge Planning/Teaching Needs:  Home with wife who is able to assist, will have other family members to help her also. Pt is very deconditioned form being in the hospital since 06/2019      Team Discussion:  significant hemiplegia, post covid, has had a CVA.  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  PT max rolling, max slideboard, goals min A transfers only, w/c level min a.  OT +2 self care, max A transfers, goals min A.  SLP cognition/memory min to mod A, goals S.   Revisions to Treatment Plan: N/A     Medical Summary Current Status: CVA with Left hemiparesis LE> UE Weekly Focus/Goal: wound care  Barriers to Discharge: Wound care   Possible Resolutions to Barriers: mattress overlay, using maximove for lifting   Continued Need for Acute Rehabilitation Level of Care: The patient requires daily medical management by a physician with specialized training in physical medicine and rehabilitation for the following reasons: Direction of a multidisciplinary physical rehabilitation program to maximize functional independence : Yes Medical management of patient stability for increased activity during participation in an intensive rehabilitation regime.: Yes Analysis of laboratory values and/or radiology reports with any subsequent need for medication adjustment and/or medical intervention. : Yes   I attest that I was present, lead the team conference, and concur with the assessment and plan of the team.   Jodell Cipro M 10/08/2019, 3:03 PM  Team conference was held via web/ teleconference due to [place] - 19

## 2019-10-08 NOTE — Progress Notes (Signed)
Occupational Therapy Session Note  Patient Details  Name: [name] MRN: [id number] Date of Birth: [birth year]  Today's Date: 10/08/2019 OT Individual Time: 1300-1400 OT Individual Time Calculation (min): 60 min    Short Term Goals: Week 1:  OT Short Term Goal 1 (Week 1): Pt will complete toilet or BSC transfer with 2 assist for OOB toileting OT Short Term Goal 2 (Week 1): Pt will complete 1 grooming task w/c level at sink with mod vcs to include Lt limb OT Short Term Goal 3 (Week 1): Pt will don overhead shirt and initiate threading L UE first  Skilled Therapeutic Interventions/Progress Updates:    Pt received supine finishing up lunch. Pt agreeable to session with no c/o pain. Pt completed bed mobility rolling R and L to don pants and check brief for incontinence, with mod A to roll R and L. Pt transitioned to sitting EOB with mod-max +2. While EOB pt able to intermittently maintain sitting balance, but overall needing as much as mod A. Pt washed UB EOB with mod A to reach under RUE and hold up LUE. Pt required cueing for use of hemi technique, requiring mod A overall to don shirt. Pt completed slideboard transfer to the TIS w/c toward the R with max +2 assist. Cueing provided for head/hips relationship and body mechanics. Pt sat in the w/c and completed B hand hygiene at the sink. Pt edu on value of BUE coordination activities, esp within ADLs, as NMR. Pt required mod A overall to assist in washing hands, especially with positioning in supination. Pt assisted in applying lotion to hand as well. Pt was edu on 1 finger sublux felt in LUE. Kinesiotape applied to help support glenohumeral joint. Pt was left sitting up in the TIS w/c with chair alarm belt fastened. Care coordination completed with PT re potential need for roho cushion.   Therapy Documentation Precautions:  Precautions Precautions: Fall Precaution Comments: Sacral decubitus wounds, Lt hemi Restrictions Weight Bearing  Restrictions: No   Therapy/Group: Individual Therapy  Curtis Sites 10/08/2019, 12:59 PM

## 2019-10-08 NOTE — Progress Notes (Signed)
Speech Language Pathology Daily Session Note  Patient Details  Name: [name] MRN: [id number] Date of Birth: [birth year]  Today's Date: 10/08/2019 SLP Individual Time: 0900-0957 SLP Individual Time Calculation (min): 57 min  Short Term Goals: Week 1: SLP Short Term Goal 1 (Week 1): Patient will complete higher level reasoning and executive functioning tasks (ex: med and money management) with 90% accuracy and min cues. SLP Short Term Goal 2 (Week 1): Patient will state and demonstrate compensatory cognitive-linguistic strategies as trained with 90% accuracy and min cues. SLP Short Term Goal 3 (Week 1): Patient will complete delayed recall tasks for 5 stimuil post ~5 minute delay with 80% accuracy and min cues.  Skilled Therapeutic Interventions: Skilled ST services focused on cognitive skills. SLP facilitated immediate recall of medication, pt demonstrated recall of 3 out 5 medication names given function and with min A phonemic cues 5 out 5 names. Pt demonstrated delayed recall of medication following visualization and repeating strategies, pt demonstrated recall of 1 out 5 names given function and 5 out 5 names given max A phonemic cues. SLP also facilitated semi-complex to complex problem solving skills in  organization/executive function tasks, pt require mod-min A verbal cues for use of recall strategies within task, executive function skills and error awareness. Pt dictated while SLP recorded during task due to poor positioning in bed pt and pt's desire to remain in reclined position. Pt was left in room with call bell within reach and bed alarm set. ST recommends to continue skilled ST services.     Pain Pain Assessment Pain Scale: 0-10 Pain Score: 0-No pain  Therapy/Group: Individual Therapy  Tamaiya Bump  [facility] 10/08/2019, 10:19 AM

## 2019-10-09 ENCOUNTER — Inpatient Hospital Stay (HOSPITAL_COMMUNITY): Payer: Medicare HMO | Admitting: Speech Pathology

## 2019-10-09 ENCOUNTER — Inpatient Hospital Stay (HOSPITAL_COMMUNITY): Payer: Medicare HMO | Admitting: Occupational Therapy

## 2019-10-09 ENCOUNTER — Inpatient Hospital Stay (HOSPITAL_COMMUNITY): Payer: Medicare HMO | Admitting: Physical Therapy

## 2019-10-09 DIAGNOSIS — I69354 Hemiplegia and hemiparesis following cerebral infarction affecting left non-dominant side: Secondary | ICD-10-CM

## 2019-10-09 LAB — RENAL FUNCTION PANEL
Albumin: 2.3 g/dL — ABNORMAL LOW (ref 3.5–5.0)
Anion gap: 11 (ref 5–15)
BUN: 39 mg/dL — ABNORMAL HIGH (ref 8–23)
CO2: 26 mmol/L (ref 22–32)
Calcium: 9.7 mg/dL (ref 8.9–10.3)
Chloride: 99 mmol/L (ref 98–111)
Creatinine, Ser: 4.95 mg/dL — ABNORMAL HIGH (ref 0.61–1.24)
GFR calc Af Amer: 13 mL/min — ABNORMAL LOW (ref >60)
GFR calc non Af Amer: 11 mL/min — ABNORMAL LOW (ref >60)
Glucose, Bld: 90 mg/dL (ref 70–99)
Phosphorus: 2.8 mg/dL (ref 2.5–4.6)
Potassium: 3.4 mmol/L — ABNORMAL LOW (ref 3.5–5.1)
Sodium: 136 mmol/L (ref 135–145)

## 2019-10-09 LAB — CBC
HCT: 25.4 % — ABNORMAL LOW (ref 39.0–52.0)
Hemoglobin: 7.6 g/dL — ABNORMAL LOW (ref 13.0–17.0)
MCH: 28 pg (ref 26.0–34.0)
MCHC: 29.9 g/dL — ABNORMAL LOW (ref 30.0–36.0)
MCV: 93.7 fL (ref 80.0–100.0)
Platelets: 342 10*3/uL (ref 150–400)
RBC: 2.71 MIL/uL — ABNORMAL LOW (ref 4.22–5.81)
RDW: 16.4 % — ABNORMAL HIGH (ref 11.5–15.5)
WBC: 11.5 10*3/uL — ABNORMAL HIGH (ref 4.0–10.5)
nRBC: 0.2 % (ref 0.0–0.2)

## 2019-10-09 MED ORDER — HEPARIN SODIUM (PORCINE) 1000 UNIT/ML DIALYSIS: 1000.0000 [IU] | INTRAMUSCULAR | Status: DC | PRN

## 2019-10-09 MED ORDER — ALTEPLASE 2 MG IJ SOLR: 2.0000 mg | Freq: Once | INTRAMUSCULAR | Status: DC | PRN

## 2019-10-09 MED ORDER — HEPARIN SODIUM (PORCINE) 1000 UNIT/ML IJ SOLN
INTRAMUSCULAR | Status: AC
  Administered 2019-10-09: 3800 [IU] via INTRAVENOUS_CENTRAL
  Filled 2019-10-09: qty 4

## 2019-10-09 MED ORDER — PENTAFLUOROPROP-TETRAFLUOROETH EX AERO: 1.0000 "application " | INHALATION_SPRAY | CUTANEOUS | Status: DC | PRN

## 2019-10-09 MED ORDER — SODIUM CHLORIDE 0.9 % IV SOLN: 100.0000 mL | INTRAVENOUS | Status: DC | PRN

## 2019-10-09 MED ORDER — LIDOCAINE-PRILOCAINE 2.5-2.5 % EX CREA: 1.0000 "application " | TOPICAL_CREAM | CUTANEOUS | Status: DC | PRN

## 2019-10-09 MED ORDER — LIDOCAINE HCL (PF) 1 % IJ SOLN: 5.0000 mL | INTRAMUSCULAR | Status: DC | PRN

## 2019-10-09 NOTE — Progress Notes (Signed)
Occupational Therapy Session Note  Patient Details  Name: [name] MRN: [id number] Date of Birth: [birth year]  Today's Date: 10/09/2019 OT Individual Time: 3244-0102 OT Individual Time Calculation (min): 32 min    Short Term Goals: Week 1:  OT Short Term Goal 1 (Week 1): Pt will complete toilet or BSC transfer with 2 assist for OOB toileting OT Short Term Goal 2 (Week 1): Pt will complete 1 grooming task w/c level at sink with mod vcs to include Lt limb OT Short Term Goal 3 (Week 1): Pt will don overhead shirt and initiate threading L UE first  Skilled Therapeutic Interventions/Progress Updates:    Patient in bed, set up with breakfast tray.  He is able to feed himself with set up.  LB bathing/dressing at bed level with max A.  Rolling in bed with mod A.  Supine to SSP max A.  Min A to maintain seated position edge of bed.  SB transfer bed to TIS w/c with max A of one and stand by of 2nd person.  Completed UB bathing and dressing at w/c level with mod A for bathing and max A for OH shirt.  He remained in the TIS w/c, seat belt alarm set and call bell in reach.    Therapy Documentation Precautions:  Precautions Precautions: Fall Precaution Comments: Sacral decubitus wounds, Lt hemi Restrictions Weight Bearing Restrictions: No General:   Vital Signs: Oxygen Therapy O2 Device: Room Air Pain: Pain Assessment Pain Scale: 0-10 Pain Score: 0-No pain Other Treatments:     Therapy/Group: Individual Therapy  Carlos Levering 10/09/2019, 12:17 PM

## 2019-10-09 NOTE — Progress Notes (Signed)
Physical Therapy Session Note  Patient Details  Name: [name] MRN: [id number] Date of Birth: [birth year]  Today's Date: 10/09/2019 PT Individual Time: 1105-1200 PT Individual Time Calculation (min): 55 min   Short Term Goals: Week 1:  PT Short Term Goal 1 (Week 1): Pt will transfer to and from [facility] with max assist +2 and LRAD PT Short Term Goal 2 (Week 1): Pt will perform bed mobility with max assist of 1 PT Short Term Goal 3 (Week 1): Pt will maintain sitting balance for up to 5 mintues with mod  assist PT Short Term Goal 4 (Week 1): PT will perform sit<>stand with max assist +2 for BLE strengthening PT Short Term Goal 5 (Week 1): Pt will tolerate 5 min on tilt table for functional WB through BLE  Skilled Therapeutic Interventions/Progress Updates:   Pt received sitting in WC and agreeable to PT. Sit<>stand x 2 in [place] + with max assist at the L shoulder and hip to engage parascapular musculature as well as quad and glutes in the hip. Pt able to tolerate standing in sara 20sec + 25sec RN to attempt to get culture of sacral wound.  RN unable to attain in standing anda pt transferred to bed. Sit>supine with max assist for LUE and LLE management. Pt reports mild pain in knee with flexion. Rolling to the R for RN to complete culture sample with mod assist. Pt performed bed level NMR: heel slides, hip abduction, SAQ, ankle DF on the L and DF/PF on the R. Clam shells. Each LE exercise completed  x10 BLE with AAROM on the L. UE wrist flexion/extension x 2 minutes. shoulder retraction/protraction and bicep/tricep reciprocal activaiton each completed 2 x 1 min. Only trace protraction at shoulder, but able to sustain against mild resistance for all others. Pt left supine in bed with call bell in reach and all needs met.       Therapy Documentation Precautions:  Precautions Precautions: Fall Precaution Comments: Sacral decubitus wounds, Lt hemi Restrictions Weight Bearing Restrictions:  No Vital Signs: Oxygen Therapy O2 Device: Room Air Pain: Pain Assessment Pain Scale: 0-10 Pain Score: 0-No pain at rest. 3/10 with movement. Pt repositioned.     Therapy/Group: Individual Therapy  [name] [name] 10/09/2019, 12:18 PM

## 2019-10-09 NOTE — Progress Notes (Signed)
[facility] PHYSICAL MEDICINE & REHABILITATION PROGRESS NOTE   Subjective/Complaints:  Pt states he has had a BM, we discussed that pt needs to call when he has urge to go (states he has urge 5-6 x per day and doesn't want to call each time )  ROS: Patient denies , nausea, vomiting, diarrhea, cough, shortness of breath or chest pain, joint or back pain,    Objective:   No results found. Recent Labs    10/07/19 1400  WBC 12.5*  HGB 8.2*  HCT 27.1*  PLT 339   Recent Labs    10/07/19 1400 10/07/19 1422  NA 135  --   K 3.2*  --   CL 97*  --   CO2 27  --   GLUCOSE 139*  --   BUN 50*  --   CREATININE 5.64*  --   CALCIUM 9.7 9.2    Intake/Output Summary (Last 24 hours) at 10/09/2019 0727 Last data filed at 10/08/2019 1800 Gross per 24 hour  Intake 365 ml  Output --  Net 365 ml     Physical Exam: Vital Signs Blood pressure (!) 149/89, pulse (!) 102, temperature 98.3 F (36.8 C), temperature source Oral, resp. rate 18, height 5\' 10"  (1.778 m), weight 81 kg, SpO2 98 %. Constitutional: No distress . Vital signs reviewed. HEENT: EOMI, oral membranes moist, thrush Neck: supple Cardiovascular: RRR without murmur. No JVD    Respiratory: CTA Bilaterally without wheezes or rales. Normal effort    GI: BS +, non-tender, non-distended  Ext: no edema Skin:Right foot no lesions or skin discoloration  MusculoskeletalRight side 4/5 in UE and LE, 3- LUE, 2- LLE hip/knee ext synergy , 0/5 at ankle and hip flexors  Psych: pleasant, normal affect Neurological: normal insight and awareness    Assessment/Plan: 1. Functional deficits secondary to Left hemiparesis from RIght parietal infarct   which require 3+ hours per day of interdisciplinary therapy in a comprehensive inpatient rehab setting.  Physiatrist is providing close team supervision and 24 hour management of active medical problems listed below.  Physiatrist and rehab team continue to assess barriers to discharge/monitor  patient progress toward functional and medical goals  Care Tool:  Bathing    Body parts bathed by patient: Chest, Abdomen, Front perineal area, Right upper leg, Left upper leg, Face   Body parts bathed by helper: Right arm, Left arm, Buttocks, Right lower leg, Left lower leg     Bathing assist Assist Level: Total Assistance - Patient < 25%(sitting in wheelchair for UB and in bed for LB)     Upper Body Dressing/Undressing Upper body dressing   What is the patient wearing?: Hospital gown only    Upper body assist Assist Level: Maximal Assistance - Patient 25 - 49%    Lower Body Dressing/Undressing Lower body dressing      What is the patient wearing?: Incontinence brief     Lower body assist Assist for lower body dressing: Total Assistance - Patient < 25%(supine rolling in bed)     Toileting Toileting Toileting Activity did not occur (Clothing management and hygiene only): N/A (no void or bm)  Toileting assist Assist for toileting: Total Assistance - Patient < 25% Assistive Device Comment: urinal    Transfers Chair/bed transfer  Transfers assist  Chair/bed transfer activity did not occur: Safety/medical concerns  Chair/bed transfer assist level: 2 Helpers(sliding board)     Locomotion Ambulation   Ambulation assist   Ambulation activity did not occur: Safety/medical concerns  Walk 10 feet activity   Assist  Walk 10 feet activity did not occur: Safety/medical concerns        Walk 50 feet activity   Assist Walk 50 feet with 2 turns activity did not occur: Safety/medical concerns         Walk 150 feet activity   Assist Walk 150 feet activity did not occur: Safety/medical concerns         Walk 10 feet on uneven surface  activity   Assist Walk 10 feet on uneven surfaces activity did not occur: Safety/medical concerns         Wheelchair     Assist   Type of Wheelchair: Manual    Wheelchair assist level: Dependent -  Patient 0%(TIS WC) Max wheelchair distance: 150    Wheelchair 50 feet with 2 turns activity    Assist        Assist Level: Dependent - Patient 0%   Wheelchair 150 feet activity     Assist      Assist Level: Dependent - Patient 0%   Blood pressure (!) 149/89, pulse (!) 102, temperature 98.3 F (36.8 C), temperature source Oral, resp. rate 18, height 5\' 10"  (1.778 m), weight 81 kg, SpO2 98 %.  Medical Problem List and Plan: 1.  Debility secondary to acute hypoxemic RIght parietal infarct with Left hemiparesis LE>UE              -patient may shower             -ELOS/Goals: modI PT, OT, SLP,  2.  Antithrombotics: -DVT/anticoagulation: Subcutaneous heparin. Negative  vascular study             -antiplatelet therapy: Aspirin 81 mg daily 3. Pain Management: Hycet as needed, Right great toe pain,improved cont  prevalon boots  4. Mood: Provide emotional support. Rozerem 8 mg nightly             -antipsychotic agents: N/A 5. Neuropsych: This patient is capable of making decisions on his own behalf.             SLP recommends additional diagnostics of higher level cognitive skills.  6. Skin/Wound Care: Wound care sacral decubitus with tunneling.   -pack with iodoform gauze bid  -woc rn consulted  -nutrition, turning, prevalon boots for heels- needs order to place qhs  7. Fluids/Electrolytes/Nutrition: Routine in and outs with follow-up chemistries 8.  Anemia/melena.  Follow-up CBC.  Continue Aranesp, Hgb fluctuating 7.6-8.4g 9.  Tracheostomy 08/22/2019.  Decannulated.  Diet advanced to regular.  Continue nebulizers as directed 10.  MSSA bacteremia/pneumonia.  Antibiotic therapy completed 11.  Hyperlipidemia.  Lipitor 12. Oropharyngeal dysphagia: Appreciate speech therapy recommendations; regular diet with oral care BID  -MMW for thrush 13.  HypoK receiving K+ bath in HD   14.  Renal failure after sepsis- on RRT per Nephro - tent d/c 1/7 will need to make plans for OP HD   LOS: 6 days A FACE TO FACE EVALUATION WAS PERFORMED  Charlett Blake 10/09/2019, 7:27 AM

## 2019-10-09 NOTE — Progress Notes (Signed)
Speech Language Pathology Daily Session Note  Patient Details  Name: [name] MRN: [id number] Date of Birth: 1951/04/02  Today's Date: 10/09/2019 SLP Individual Time: 1030-1130 SLP Individual Time Calculation (min): 60 min  Short Term Goals: Week 1: SLP Short Term Goal 1 (Week 1): Patient will complete higher level reasoning and executive functioning tasks (ex: med and money management) with 90% accuracy and min cues. SLP Short Term Goal 2 (Week 1): Patient will state and demonstrate compensatory cognitive-linguistic strategies as trained with 90% accuracy and min cues. SLP Short Term Goal 3 (Week 1): Patient will complete delayed recall tasks for 5 stimuil post ~5 minute delay with 80% accuracy and min cues.  Skilled Therapeutic Interventions:  Skilled treatment session focused on cognition goals. SLP facilitated session by providing complex reasoning task targeting scheduling list of errands. Pt completed with 25% accuracy prior to any cues. Specifically, pt required Mod A written cues to identify salient information within passage and Mod A verbal cues to re-read passage for directive information. Pt with minimal awareness of difficulty of task. SLP also provided semi-complex deductive reasoning puzzle. Pt with difficulty verbalizing how to approach the task. SLP provided structure to task specifically, sequence steps to utilize when attempting task (mark off the obvious information first, read thru the information). Pt unable to organize task and required SLP to provide organization. With organization, pt able to answer [facility] questions. Pt left in tilt-in-space wheelchair with all needs within reach. Continue per current plan of care.       Pain Pain Assessment Pain Scale: 0-10 Pain Score: 0-No pain  Therapy/Group: Individual Therapy  [name] 10/09/2019, 2:42 PM

## 2019-10-09 NOTE — Progress Notes (Signed)
[facility] Progress Note   Subjective:   Patient seen and examined at bedside in dialysis.  Feeling stronger.  No change in urine output.  Denies SOB, CP, n/v/d, and edema.   Objective Vitals:   10/09/19 1445 10/09/19 1446 10/09/19 1500 10/09/19 1530  BP: (!) 151/95 (!) 147/92 (!) 143/88 (!) 165/90  Pulse: (!) 103 (!) 106 (!) 105 (!) 105  Resp:      Temp:      TempSrc:      SpO2:      Weight:      Height:       Physical Exam General:NAD Heart:RRR Lungs:CTAB anteriorily Abdomen:soft, NTND Extremities:no LE edema Dialysis Access: [facility] accessed    Filed Weights   10/07/19 0517 10/07/19 1340 10/07/19 1718  Weight: 87 kg 83 kg 81 kg    Intake/Output Summary (Last 24 hours) at 10/09/2019 1606 Last data filed at 10/09/2019 1300 Gross per 24 hour  Intake 360 ml  Output --  Net 360 ml    Additional Objective Labs: Basic Metabolic Panel: Recent Labs  Lab 10/06/19 0650 10/07/19 1400 10/07/19 1422 10/09/19 1508  NA 136 135  --  136  K 3.5 3.2*  --  3.4*  CL 100 97*  --  99  CO2 28 27  --  26  GLUCOSE 87 139*  --  90  BUN 33* 50*  --  39*  CREATININE 4.55* 5.64*  --  4.95*  CALCIUM 9.6 9.7 9.2 9.7  PHOS  --  3.3  --  2.8   Liver Function Tests: Recent Labs  Lab 10/07/19 1400 10/09/19 1508  ALBUMIN 2.2* 2.3*   CBC: Recent Labs  Lab 10/03/19 0555 10/06/19 0650 10/07/19 1400 10/09/19 1507  WBC 11.3* 11.0* 12.5* 11.5*  HGB 8.4* 7.6* 8.2* 7.6*  HCT 27.5* 25.7* 27.1* 25.4*  MCV 92.6 93.1 94.4 93.7  PLT 281 307 339 342   Blood Culture    Component Value Date/Time   SDES WOUND COCCYX 10/09/2019 1154   SPECREQUEST NONE 10/09/2019 1154   CULT PENDING 10/09/2019 1154   REPTSTATUS PENDING 10/09/2019 1154   CBG: Recent Labs  Lab 10/02/19 1719 10/02/19 2105 10/03/19 0715 10/03/19 1122  GLUCAP 86 125* 79 95    Lab Results  Component Value Date   INR 1.2 09/02/2019   INR 1.0 08/22/2019   Studies/Results: No results  found.  Medications: . sodium chloride    . sodium chloride     . aspirin EC  81 mg Oral Daily  . atorvastatin  40 mg Oral q1800  . budesonide (PULMICORT) nebulizer solution  0.5 mg Nebulization BID  . chlorhexidine  15 mL Mouth/Throat BID  . Chlorhexidine Gluconate Cloth  6 each Topical Q0600  . darbepoetin (ARANESP) injection - DIALYSIS  150 mcg Intravenous Q Sat-HD  . feeding supplement (NEPRO CARB STEADY)  237 mL Oral TID BM  . feeding supplement (PRO-STAT SUGAR FREE 64)  30 mL Oral BID  . heparin  5,000 Units Subcutaneous Q8H  . magic mouthwash w/lidocaine  5 mL Oral QID  . multivitamin  1 tablet Oral QHS  . pantoprazole  40 mg Oral Q1200  . ramelteon  8 mg Oral QHS  . senna-docusate  2 tablet Oral BID  . sucralfate  1 g Oral Q6H    Dialysis Orders: Dialysis dependent AKI  presumed progressed to  ESRD   - outpatient HD has been arranged at [facility] 12:30 pm after d/c pending no change  Assessment/Plan: 1.S/p acute hypoxemia and respiratory failure due to COVID 43:[id number] complications/ secondary infections, prolonged ICU course. R chest tubeout,trach removed 11/30. Transferred to CIR 12/11 2. [place] near 2 month acute admission for COVID/PNA/hypoixc resp failure now on TTSschedule -Admitted 10/3 and started CRRT on 08/07/19, has been on RRT for a little under 2 mos now. Patient prefers to avoid perm access for now hoping that renal function will recover. outpatient HD set up at [facility]. Foley out - generally requiring added K bath - HD today running 3.5 hours - no heparin due to GIB. Next HD on 12/19.  3.HTN/volume:BPvariable, elevated today, improving with HD.  UF goal 2L.  Not on antihypertensives.  4. Anemia:Hgb7.7> 8.4> 7.6 > 8.2>7.6  HxGI bleed this admission. S/pIV iron. Continue Aranesp144mcg q Sat.- tsat 23% 11/29 - on carafate. tsat drawn after 3 doses ferrlicit (282 mg given) - follow - transfuse prn -   5. Secondary  hyperparathyroidism:CorrCa slightly high. Not on calcium or vit D, use low Ca bath- limited to 2.25 due to need for 4 K . Phos nowatgoal.iPTH suppressed at 28  6.SUO:RVIFBPPHKFEXM cortex and right cerebellum small acute to subacuteinfarctswithneuromuscular weakness/encephalopathy. 7. Nutrition: Alb very low - post COVID anorexia due to lack of taste. Addednepro + pro-stat supplements. Diet liberalized 8.Sacral decub: Pressure ulcer - wound care following.  Wound culture sent today, results pending. 9. Debility - per CIR 10. Code status DNR  Jen Mow, PA-C [facility] Pager: [phone] 10/09/2019,4:06 PM  LOS: 6 days

## 2019-10-10 ENCOUNTER — Inpatient Hospital Stay (HOSPITAL_COMMUNITY): Payer: Medicare HMO | Admitting: Physical Therapy

## 2019-10-10 ENCOUNTER — Inpatient Hospital Stay (HOSPITAL_COMMUNITY): Payer: Medicare HMO | Admitting: Occupational Therapy

## 2019-10-10 ENCOUNTER — Inpatient Hospital Stay (HOSPITAL_COMMUNITY): Payer: Medicare HMO | Admitting: Speech Pathology

## 2019-10-10 DIAGNOSIS — Z992 Dependence on renal dialysis: Secondary | ICD-10-CM

## 2019-10-10 DIAGNOSIS — N186 End stage renal disease: Secondary | ICD-10-CM

## 2019-10-10 MED ORDER — CHLORHEXIDINE GLUCONATE CLOTH 2 % EX PADS
6.0000 | MEDICATED_PAD | Freq: Every day | CUTANEOUS | Status: DC
  Administered 2019-10-11: 6 via TOPICAL

## 2019-10-10 NOTE — Progress Notes (Addendum)
[facility] Progress Note   Subjective:  Patient seen and examined at bedside.  No new complaints.  Denies SOB, CP, and n/v/d.  Objective Vitals:   10/09/19 1925 10/09/19 1943 10/10/19 0455 10/10/19 1422  BP:  132/81 135/83 139/84  Pulse:  (!) 110 99 (!) 110  Resp:  18 18 18   Temp:  97.7 F (36.5 C) 97.6 F (36.4 C) 99 F (37.2 C)  TempSrc:  Oral Oral Oral  SpO2: 95% 98% 98% 100%  Weight:      Height:       Physical Exam General:NAD, chronically ill appearing male Heart:RRR Lungs:mostly CTAB, +rhonchi on R Abdomen:soft, NTND Extremities:no LE edema Dialysis Access: [facility]   Filed Weights   10/07/19 0517 10/07/19 1340 10/07/19 1718  Weight: 87 kg 83 kg 81 kg    Intake/Output Summary (Last 24 hours) at 10/10/2019 1536 Last data filed at 10/10/2019 0910 Gross per 24 hour  Intake 120 ml  Output 1000 ml  Net -880 ml    Additional Objective Labs: Basic Metabolic Panel: Recent Labs  Lab 10/06/19 0650 10/07/19 1400 10/07/19 1422 10/09/19 1508  NA 136 135  --  136  K 3.5 3.2*  --  3.4*  CL 100 97*  --  99  CO2 28 27  --  26  GLUCOSE 87 139*  --  90  BUN 33* 50*  --  39*  CREATININE 4.55* 5.64*  --  4.95*  CALCIUM 9.6 9.7 9.2 9.7  PHOS  --  3.3  --  2.8   Liver Function Tests: Recent Labs  Lab 10/07/19 1400 10/09/19 1508  ALBUMIN 2.2* 2.3*   CBC: Recent Labs  Lab 10/06/19 0650 10/07/19 1400 10/09/19 1507  WBC 11.0* 12.5* 11.5*  HGB 7.6* 8.2* 7.6*  HCT 25.7* 27.1* 25.4*  MCV 93.1 94.4 93.7  PLT 307 339 342   Blood Culture    Component Value Date/Time   SDES WOUND COCCYX 10/09/2019 1154   SPECREQUEST NONE 10/09/2019 1154   CULT  10/09/2019 1154    RARE SERRATIA MARCESCENS SUSCEPTIBILITIES TO FOLLOW Performed at [facility], [place] [address], [place], [place] [address]    REPTSTATUS PENDING 10/09/2019 1154    Lab Results  Component Value Date   INR 1.2 09/02/2019   INR 1.0 08/22/2019   Studies/Results: No results  found.  Medications:  . aspirin EC  81 mg Oral Daily  . atorvastatin  40 mg Oral q1800  . budesonide (PULMICORT) nebulizer solution  0.5 mg Nebulization BID  . chlorhexidine  15 mL Mouth/Throat BID  . Chlorhexidine Gluconate Cloth  6 each Topical Q0600  . darbepoetin (ARANESP) injection - DIALYSIS  150 mcg Intravenous Q Sat-HD  . feeding supplement (NEPRO CARB STEADY)  237 mL Oral TID BM  . feeding supplement (PRO-STAT SUGAR FREE 64)  30 mL Oral BID  . heparin  5,000 Units Subcutaneous Q8H  . magic mouthwash w/lidocaine  5 mL Oral QID  . multivitamin  1 tablet Oral QHS  . pantoprazole  40 mg Oral Q1200  . ramelteon  8 mg Oral QHS  . senna-docusate  2 tablet Oral BID  . sucralfate  1 g Oral Q6H    Dialysis Orders: Dialysis dependent AKI presumed progressed to ESRD - outpatient HD has been arranged at [facility] 12:30 pm after d/c pending no change  Assessment/Plan: 1.S/p acute hypoxemia and respiratory failure due to COVID 69:SWNIOEVO complications/ secondary infections, prolonged ICU course. R chest tubeout,trach removed  11/30. Transferred to CIR 12/11 2. [place] near 2 month acute admission for COVID/PNA/hypoixc resp failure now on TTSschedule -Admitted 10/3 and started CRRT on 08/07/19, has been on RRT for a little under 2 mos now. Patient prefers to avoid perm access for now hoping that renal function will recover, but states he will if he has too.  [facility] consulted for access placement. outpatient HD set up at [facility]. Foley out - generally requiring added K bath.  no heparin due to GIB. Next HD on 12/19.  3.HTN/volume:BPwell controlled. Does not appear volume overload.  Not on antihypertensives. Need standing weights to help determine dry.  4. Anemia:Hgb7.7> 8.4> 7.6> 8.2>7.6HxGI bleed this admission. S/pIV iron. Continue Aranesp139mcg q Sat.- tsat 23% 11/29 - on carafate - ok for short period of time but not long term due to risk of  aluminium toxicity.  Transfuse prn. 5. Secondary hyperparathyroidism:CorrCa slightly high. Not on calcium or vit D, use low Ca bath- limited to 2.25 due to need for 4 K. Phos nowatgoal.iPTHsuppressed at 28 6.VDI:XVEZBMZTAEWYB cortex and right cerebellum small acute to subacuteinfarctswithneuromuscular weakness/encephalopathy. 7. Nutrition: Alb very low - post COVID anorexia due to lack of taste. Addednepro + pro-stat supplements. Diet liberalized 8.Sacral decub: Pressure ulcer - wound care following.  Wound culture showed rare serratia marcescens, susceptibility pending.  Per [facility] nurse if drainage not "handled" by current tunneled dressing, then will likely need a surgery consult to investigate if an abscess is present.  9. Debility - per CIR 10. Code status DNR  Jen Mow, PA-C [facility] Pager: [phone] 10/10/2019,3:36 PM  LOS: 7 days   I have seen and examined this patient and agree with plan and assessment in the above note with renal recommendations/intervention highlighted.  Pt doing well and without new complaints. Governor Rooks Dalina Samara,MD 10/10/2019 6:23 PM

## 2019-10-10 NOTE — Consult Note (Signed)
[facility] Nurse wound follow up Patient receiving care in [facility] [phone].  The patient's primary RN, paged to tell the [facility] service that once daily dressing changes is not handling the amount of drainage to the coccyx wound.  Drainage (amount, consistency, odor) no odor, yellow drainage, no induration, no erythema, no heat to area, just a lot of drainage.  Dressing procedure/placement/frequency:  Pack tunneled wound with strips of silver hydrofiber, Aquacel Ag+ [employer] # [id number] (will need to cut silver into one long strip), cut in a circular motion until you have one long strip, like a cinnamon bun. Then take another Aquacel dressing, fold it in half or in quarters, place over the opening, then cover with a foam dressing.  Change twice daily.   If this does not handle the drainage, then the next step would be to ask the MD or PA covering the case to consider consulting surgical services and investigate if an abscess may be the cause. Monitor the wound area(s) for worsening of condition such as: Signs/symptoms of infection,  Increase in size,  Development of or worsening of odor, Development of pain, or increased pain at the affected locations.  Notify the medical team if any of these develop.  Thank you for the consult.  Discussed plan of care with the bedside nurse.  [facility] nurse will not follow at this time.  Please re-consult the [facility] team if needed.  Val Riles, RN, MSN, CWOCN, CNS-BC, pager [phone]

## 2019-10-10 NOTE — Progress Notes (Signed)
Pt c/o pain in Right foot great toe. Foot against bottom rail with toe in open area. Foot is dry and flaky. No obvious injury. When writer palpated great toe medial edge pt stated it was sore. Nail is slightly curved and into edge of toe. Moisturizer applied. Wedge and pillows placed at foot of bed for protection.

## 2019-10-10 NOTE — Progress Notes (Signed)
[place] PHYSICAL MEDICINE & REHABILITATION PROGRESS NOTE   Subjective/Complaints:  No new issues, discussed wound care, no sig pain   ROS: Patient denies , nausea, vomiting, diarrhea, cough, shortness of breath or chest pain, joint or back pain,    Objective:   No results found. Recent Labs    10/07/19 1400 10/09/19 1507  WBC 12.5* 11.5*  HGB 8.2* 7.6*  HCT 27.1* 25.4*  PLT 339 342   Recent Labs    10/07/19 1400 10/07/19 1422 10/09/19 1508  NA 135  --  136  K 3.2*  --  3.4*  CL 97*  --  99  CO2 27  --  26  GLUCOSE 139*  --  90  BUN 50*  --  39*  CREATININE 5.64*  --  4.95*  CALCIUM 9.7 9.2 9.7    Intake/Output Summary (Last 24 hours) at 10/10/2019 0752 Last data filed at 10/09/2019 1815 Gross per 24 hour  Intake 240 ml  Output 1000 ml  Net -760 ml     Physical Exam: Vital Signs Blood pressure 135/83, pulse 99, temperature 97.6 F (36.4 C), temperature source Oral, resp. rate 18, height 5\' 10"  (1.778 m), weight 81 kg, SpO2 98 %. Constitutional: No distress . Vital signs reviewed. HEENT: EOMI, oral membranes moist, thrush Neck: supple Cardiovascular: RRR without murmur. No JVD    Respiratory: CTA Bilaterally without wheezes or rales. Normal effort    GI: BS +, non-tender, non-distended  Ext: no edema Skin:Right foot no lesions or skin discoloration  MusculoskeletalRight side 4/5 in UE and LE, 3- LUE, 2- LLE hip/knee ext synergy , 0/5 at ankle and hip flexors  Psych: pleasant, normal affect Neurological: normal insight and awareness    Assessment/Plan: 1. Functional deficits secondary to Left hemiparesis from RIght parietal infarct   which require 3+ hours per day of interdisciplinary therapy in a comprehensive inpatient rehab setting.  Physiatrist is providing close team supervision and 24 hour management of active medical problems listed below.  Physiatrist and rehab team continue to assess barriers to discharge/monitor patient progress toward  functional and medical goals  Care Tool:  Bathing    Body parts bathed by patient: Chest, Abdomen, Front perineal area, Right upper leg, Left upper leg, Face   Body parts bathed by helper: Right arm, Left arm, Buttocks, Right lower leg, Left lower leg     Bathing assist Assist Level: Maximal Assistance - Patient 24 - 49%     Upper Body Dressing/Undressing Upper body dressing   What is the patient wearing?: Pull over shirt    Upper body assist Assist Level: Maximal Assistance - Patient 25 - 49%    Lower Body Dressing/Undressing Lower body dressing      What is the patient wearing?: Pants     Lower body assist Assist for lower body dressing: Total Assistance - Patient < 25%     Toileting Toileting Toileting Activity did not occur (Clothing management and hygiene only): N/A (no void or bm)  Toileting assist Assist for toileting: Total Assistance - Patient < 25% Assistive Device Comment: urinal    Transfers Chair/bed transfer  Transfers assist  Chair/bed transfer activity did not occur: Safety/medical concerns  Chair/bed transfer assist level: 2 Helpers(sliding board)     Locomotion Ambulation   Ambulation assist   Ambulation activity did not occur: Safety/medical concerns          Walk 10 feet activity   Assist  Walk 10 feet activity did not occur: Safety/medical  concerns        Walk 50 feet activity   Assist Walk 50 feet with 2 turns activity did not occur: Safety/medical concerns         Walk 150 feet activity   Assist Walk 150 feet activity did not occur: Safety/medical concerns         Walk 10 feet on uneven surface  activity   Assist Walk 10 feet on uneven surfaces activity did not occur: Safety/medical concerns         Wheelchair     Assist   Type of Wheelchair: Manual    Wheelchair assist level: Dependent - Patient 0%(TIS WC) Max wheelchair distance: 150    Wheelchair 50 feet with 2 turns  activity    Assist        Assist Level: Dependent - Patient 0%   Wheelchair 150 feet activity     Assist      Assist Level: Dependent - Patient 0%   Blood pressure 135/83, pulse 99, temperature 97.6 F (36.4 C), temperature source Oral, resp. rate 18, height 5\' 10"  (1.778 m), weight 81 kg, SpO2 98 %.  Medical Problem List and Plan: 1.  Debility secondary to acute hypoxemic RIght parietal infarct with Left hemiparesis LE>UE              -patient may shower             -ELOS/Goals: modI PT, OT, SLP,  2.  Antithrombotics: -DVT/anticoagulation: Subcutaneous heparin. Negative  vascular study             -antiplatelet therapy: Aspirin 81 mg daily 3. Pain Management: Hycet as needed, Right great toe pain,improved cont  prevalon boots  4. Mood: Provide emotional support. Rozerem 8 mg nightly             -antipsychotic agents: N/A 5. Neuropsych: This patient is capable of making decisions on his own behalf.             SLP recommends additional diagnostics of higher level cognitive skills.  6. Skin/Wound Care: Wound care sacral decubitus with tunneling.   -pack with iodoform gauze bid  -woc rn consulted  -nutrition, turning, prevalon boots for heels- needs order to place qhs  7. Fluids/Electrolytes/Nutrition: Routine in and outs with follow-up chemistries 8.  Anemia/melena.  Follow-up CBC.  Continue Aranesp, Hgb fluctuating 7.6-8.4g 9.  Tracheostomy 08/22/2019.  Decannulated.  Diet advanced to regular.  Continue nebulizers as directed 10.  MSSA bacteremia/pneumonia.  Antibiotic therapy completed 11.  Hyperlipidemia.  Lipitor 12. Oropharyngeal dysphagia: Appreciate speech therapy recommendations; regular diet with oral care BID  -MMW for thrush 13.  HypoK receiving K+ bath in HD   14.  Renal failure after sepsis- on RRT per Nephro - tent d/c 1/7 will need to make plans for OP HD  LOS: 7 days A FACE TO FACE EVALUATION WAS PERFORMED  Charlett Blake 10/10/2019, 7:52 AM

## 2019-10-10 NOTE — Progress Notes (Signed)
Physical Therapy Weekly Progress Note  Patient Details  Name: [name] MRN: [id number] Date of Birth: June 11, 1951  Beginning of progress report period: October 04, 2019 End of progress report period: October 10, 2019  Today's Date: 10/10/2019 PT Individual Time: 1000-1100 PT Individual Time Calculation (min): 60 min   Patient has met 5 of 5 short term goals.  Pt is making slow progress towards LTG and improved functional mobility. Improved postural control and BLE strength have allowed pt progress to max assist of 1 for bed mobility. SB transfers and to from [facility] with + 2 assist for safety as well as sit<>stand in sara with max assist of 2 for posture and lift management.   Patient continues to demonstrate the following deficits muscle weakness, muscle joint tightness and muscle paralysis, decreased cardiorespiratoy endurance, impaired timing and sequencing, abnormal tone, unbalanced muscle activation, motor apraxia, ataxia, decreased coordination and decreased motor planning, decreased visual perceptual skills, decreased attention to left, decreased initiation, decreased awareness, decreased problem solving, decreased safety awareness, decreased memory and delayed processing and decreased sitting balance, decreased standing balance, decreased postural control, hemiplegia and decreased balance strategies and therefore will continue to benefit from skilled PT intervention to increase functional independence with mobility.  Patient progressing toward long term goals..  Continue plan of care.  PT Short Term Goals Week 1:  PT Short Term Goal 1 (Week 1): Pt will transfer to and from [facility] with max assist +2 and LRAD PT Short Term Goal 1 - Progress (Week 1): Met PT Short Term Goal 2 (Week 1): Pt will perform bed mobility with max assist of 1 PT Short Term Goal 2 - Progress (Week 1): Met PT Short Term Goal 3 (Week 1): Pt will maintain sitting balance for up to 5 mintues with mod  assist PT  Short Term Goal 3 - Progress (Week 1): Met PT Short Term Goal 4 (Week 1): PT will perform sit<>stand with max assist +2 for BLE strengthening PT Short Term Goal 4 - Progress (Week 1): Met PT Short Term Goal 5 (Week 1): Pt will tolerate 5 min on tilt table for functional WB through BLE PT Short Term Goal 5 - Progress (Week 1): Met Week 2:  PT Short Term Goal 1 (Week 2): Pt will maintain sitting balance EOB x 5 minutes with min assist PT Short Term Goal 2 (Week 2): Pt will perform bed<>WC transfers with max assist of 1 intermittiently PT Short Term Goal 3 (Week 2): Pt will initiate WC propulsion PT Short Term Goal 4 (Week 2): Pt will initiate gait training with mechanical lift up to 2f  Skilled Therapeutic Interventions/Progress Updates:   Pt received supine in bed and agreeable to PT. Maxi move transfer to TIS WC transported to day room and placed on tilt table with maximove. Pt raised to 45deg x 3 mintues with no s/s pf orthostatic hypotension, increased to 60 deg x 3 min with no s/s of hypotension. Pt instructed to perform terminal knee extension in BLE x 10 while at 60 deg with activation in BLE noted. Pt reports incontinent BM while in standing. Pt returned to room and performed maxi move transfer to bed. Rolling R and L to doff brief and pants. Total A for pants management and mod assist of 1 for rolling R and L. PT replaced lower body dressing to total A, then pt reports additional BM. Rolling R with max assist for improved trunk activaiton. pericare completed by PT for all BM. Blocked practice  rolling R x 6 following pericare to improved trunkal activation and postural control. Pt left in bed with call bell in reach and all needs met.       Therapy Documentation Precautions:  Precautions Precautions: Fall Precaution Comments: Sacral decubitus wounds, Lt hemi Restrictions Weight Bearing Restrictions: No Vital Signs: Therapy Vitals Temp: 99 F (37.2 C) Temp Source: Oral Pulse Rate:  (!) 110 Resp: 18 BP: 139/84 Patient Position (if appropriate): Lying Oxygen Therapy SpO2: 100 % O2 Device: Room Air Pain:   denies at rest.  Therapy/Group: Individual Therapy  [name] [name] 10/10/2019, 6:03 PM

## 2019-10-10 NOTE — Progress Notes (Signed)
Speech Language Pathology Weekly Progress and Session Note  Patient Details  Name: [name] MRN: [id number] Date of Birth: 04-21-1951  Beginning of progress report period: October 04, 2019 End of progress report period: October 10, 2019  Today's Date: 10/10/2019 SLP Individual Time: 1345-1445 SLP Individual Time Calculation (min): 60 min  Short Term Goals: Week 1: SLP Short Term Goal 1 (Week 1): Patient will complete higher level reasoning and executive functioning tasks (ex: med and money management) with 90% accuracy and min cues. SLP Short Term Goal 1 - Progress (Week 1): Progressing toward goal SLP Short Term Goal 2 (Week 1): Patient will state and demonstrate compensatory cognitive-linguistic strategies as trained with 90% accuracy and min cues. SLP Short Term Goal 2 - Progress (Week 1): Progressing toward goal SLP Short Term Goal 3 (Week 1): Patient will complete delayed recall tasks for 5 stimuil post ~5 minute delay with 80% accuracy and min cues. SLP Short Term Goal 3 - Progress (Week 1): Progressing toward goal    New Short Term Goals: Week 2: SLP Short Term Goal 1 (Week 2): Patient will complete higher level reasoning and executive functioning tasks (ex: med and money management) with 90% accuracy and min cues. SLP Short Term Goal 2 (Week 2): Patient will state and demonstrate compensatory cognitive-linguistic strategies as trained with 90% accuracy and min cues. SLP Short Term Goal 3 (Week 2): Patient will complete delayed recall tasks for 5 stimuil post ~5 minute delay with 80% accuracy and min cues.  Weekly Progress Updates: Pt has made steady progress over this reporting period and as a result he is progressing towards Min A when completing higher level cognitive tasks. Therapy has targeted introduction and use of memory strategies (WRAP) when recalling medications and task organization when completing reasoning tasks. Pt continues to required Moderate assistance with  use of memory strategies as well as executive function/task organization during semi-complex to complex problem solving and reasoning tasks. Education has been ongoing during sessions. As such skilled ST continues to be required to target the above mentioned deficits, increase functional independence and reduce caregiver burden.    Daily Session  Skilled Therapeutic Interventions: Skilled treatment session focused on cognition. Pt's wife present during session. SLP facilitated session by providing novel card game to target selective attention, semi-complex problem solving and recall. Pt able to execute rules of game with Minimal verbal cues targeting recall of rules. Pt with good selective attention for ~ 45 minutes in mild to moderately distracting environment. SLP also provided another scheduling task with instructions given for pt to identify salient information, create list of errands prior to organizing into schedule. Pt able to identify 8 out of 10 salient pieces of information and then he required Moderate verbal cues to find the remaining 2 items. Education provided on how task is comparable to tasks within daily living. Pt voiced understanding. Pt left upright in bed with wife present and all needs within reach. Continue per current plan of care.     General    Pain    Therapy/Group: Individual Therapy  Leavy Heatherly 10/10/2019, 2:42 PM

## 2019-10-10 NOTE — Consult Note (Addendum)
[facility] NOTE   MRN : [id number]  Reason for Consult: ESRD Referring Physician: Dr. Dicie Beam  History of Present Illness: 68 y/o male diagnosed with COVID Sept/20/2020.  He had a history of CKD with baseline Cr of 1.5.  He was started on CRRT on 08/07/19.  We have been consulted to provide permanent HD access. He has a right TDC.  LGX:QJJHERDEYCXKG cortex and right cerebellum small acute to subacuteinfarctswithneuromuscular weakness/encephalopathy.  Transferred to CIR for stroke rehabilitation.     Current Facility-Administered Medications  Medication Dose Route Frequency Provider Last Rate Last Admin  . acetaminophen (TYLENOL) 160 MG/5ML solution 650 mg  650 mg Oral Q4H PRN Cathlyn Parsons, PA-C   650 mg at 10/03/19 1735  . aspirin EC tablet 81 mg  81 mg Oral Daily Cathlyn Parsons, PA-C   81 mg at 10/10/19 8185  . atorvastatin (LIPITOR) tablet 40 mg  40 mg Oral q1800 Cathlyn Parsons, PA-C   40 mg at 10/09/19 1906  . budesonide (PULMICORT) nebulizer solution 0.5 mg  0.5 mg Nebulization BID Cathlyn Parsons, PA-C   0.5 mg at 10/09/19 6314  . chlorhexidine (PERIDEX) 0.12 % solution 15 mL  15 mL Mouth/Throat BID Cathlyn Parsons, PA-C   15 mL at 10/09/19 1921  . Chlorhexidine Gluconate Cloth 2 % PADS 6 each  6 each Topical Q0600 Alric Seton, PA-C   6 each at 10/10/19 0505  . Darbepoetin Alfa (ARANESP) injection 150 mcg  150 mcg Intravenous Q Sat-HD Cathlyn Parsons, PA-C   150 mcg at 10/04/19 1754  . feeding supplement (NEPRO CARB STEADY) liquid 237 mL  237 mL Oral TID BM Angiulli, Lavon Paganini, PA-C   237 mL at 10/10/19 1000  . feeding supplement (PRO-STAT SUGAR FREE 64) liquid 30 mL  30 mL Oral BID Cathlyn Parsons, PA-C   30 mL at 10/10/19 0913  . heparin injection 5,000 Units  5,000 Units Subcutaneous Q8H Cathlyn Parsons, PA-C   5,000 Units at 10/10/19 9702  . HYDROcodone-acetaminophen (HYCET) 7.5-325 mg/15 ml solution 10  mL  10 mL Oral Q4H PRN Cathlyn Parsons, PA-C   10 mL at 10/09/19 1940  . ipratropium-albuterol (DUONEB) 0.5-2.5 (3) MG/3ML nebulizer solution 3 mL  3 mL Nebulization Q4H PRN Angiulli, Lavon Paganini, PA-C      . magic mouthwash w/lidocaine  5 mL Oral QID Meredith Staggers, MD   5 mL at 10/10/19 1147  . multivitamin (RENA-VIT) tablet 1 tablet  1 tablet Oral QHS Cathlyn Parsons, PA-C   1 tablet at 10/09/19 2150  . pantoprazole (PROTONIX) EC tablet 40 mg  40 mg Oral Q1200 AngiulliLavon Paganini, PA-C   40 mg at 10/10/19 1147  . polyethylene glycol (MIRALAX / GLYCOLAX) packet 17 g  17 g Oral Daily PRN Angiulli, Lavon Paganini, PA-C      . polyvinyl alcohol (LIQUIFILM TEARS) 1.4 % ophthalmic solution 1 drop  1 drop Both Eyes PRN AngiulliLavon Paganini, PA-C   1 drop at 10/05/19 0754  . ramelteon (ROZEREM) tablet 8 mg  8 mg Oral QHS AngiulliLavon Paganini, PA-C   8 mg at 10/09/19 2150  . senna-docusate (Senokot-S) tablet 2 tablet  2 tablet Oral BID Charlett Blake, MD   2 tablet at 10/09/19 1923  . sorbitol 70 % solution 30 mL  30 mL Oral Daily PRN Angiulli, Lavon Paganini, PA-C      . sucralfate (CARAFATE) 1 GM/10ML suspension  1 g  1 g Oral Q6H Angiulli, Lavon Paganini, PA-C   1 g at 10/10/19 1147  . white petrolatum (VASELINE) gel 1 application  1 application Topical PRN Angiulli, Lavon Paganini, PA-C        Pt meds include: Statin :Yes Betablocker: No ASA: Yes Other anticoagulants/antiplatelets: None  Past Medical History:  Diagnosis Date  . COVID-19   . Heartburn     Past Surgical History:  Procedure Laterality Date  . ACHILLES TENDON SURGERY Right 2012  . COLONOSCOPY  2009   [facility]  . ESOPHAGOGASTRODUODENOSCOPY N/A 12/25/2014   Procedure: ESOPHAGOGASTRODUODENOSCOPY (EGD);  Surgeon: Danie Binder, MD;  Location: [facility];  Service: Endoscopy;  Laterality: N/A;  830am  . ESOPHAGOGASTRODUODENOSCOPY N/A 08/09/2019   Procedure: ESOPHAGOGASTRODUODENOSCOPY (EGD);  Surgeon: Ronald Lobo, MD;  Location: [facility];   Service: Endoscopy;  Laterality: N/A;  Patient is already sedated, so I do not anticipate need for further sedation.  Okay from my standpoint to do either at the bedside or in the operating room  . HEMOSTASIS CLIP PLACEMENT  08/09/2019   Procedure: HEMOSTASIS CLIP PLACEMENT;  Surgeon: Ronald Lobo, MD;  Location: [facility];  Service: Endoscopy;;  . HEMOSTASIS CONTROL  08/09/2019   Procedure: HEMOSTASIS CONTROL;  Surgeon: Ronald Lobo, MD;  Location: [facility];  Service: Endoscopy;;  epi   . IR FLUORO GUIDE CV LINE LEFT  09/02/2019  . IR US GUIDE VASC ACCESS LEFT  09/02/2019  . KNEE SURGERY Left 1980's  . none    . WRIST SURGERY Left 1970's    Social History Social History   Tobacco Use  . Smoking status: Never Smoker  . Smokeless tobacco: Never Used  Substance Use Topics  . Alcohol use: Yes    Alcohol/week: 2.0 standard drinks    Types: 2 Shots of liquor per week    Comment: moderate  . Drug use: No    Family History Family History  Problem Relation Age of Onset  . Colon cancer Neg Hx   . Colon polyps Neg Hx     No Known Allergies   REVIEW OF SYSTEMS  General: [ ]  Weight loss, [ ]  Fever, [ ]  chills Neurologic: [ ]  Dizziness, [ ]  Blackouts, [ ]  Seizure [x ] Stroke, [ ]  "Mini stroke", [ ]  Slurred speech, [ ]  Temporary blindness; [ ]  weakness in arms or legs, [ ]  Hoarseness [ ]  Dysphagia Cardiac: [ ]  Chest pain/pressure, [ ]  Shortness of breath at rest [x ] Shortness of breath with exertion, [ ]  Atrial fibrillation or irregular heartbeat  Vascular: [ ]  Pain in legs with walking, [ ]  Pain in legs at rest, [ ]  Pain in legs at night,  [ ]  Non-healing ulcer, [ ]  Blood clot in vein/DVT,   Pulmonary: [ ]  Home oxygen, [ ]  Productive cough, [ ]  Coughing up blood, [ ]  Asthma,  [ ]  Wheezing [ ]  COPD Musculoskeletal:  [ ]  Arthritis, [ ]  Low back pain, [ ]  Joint pain Hematologic: [ ]  Easy Bruising, [ ]  Anemia; [ ]  Hepatitis Gastrointestinal: [ ]  Blood in stool, [ ]   Gastroesophageal Reflux/heartburn, Urinary: [ ]  chronic Kidney disease, [x ] on HD - [ ]  MWF or [x ] TTHS, [ ]  Burning with urination, [ ]  Difficulty urinating Skin: [ ]  Rashes, [ ]  Wounds Psychological: [ ]  Anxiety, [ ]  Depression  Physical Examination Vitals:   10/09/19 1800 10/09/19 1925 10/09/19 1943 10/10/19 0455  BP: (!) 148/86  132/81 135/83  Pulse: Marland Kitchen)  105  (!) 110 99  Resp:   18 18  Temp:   97.7 F (36.5 C) 97.6 F (36.4 C)  TempSrc:   Oral Oral  SpO2:  95% 98% 98%  Weight:      Height:       Body mass index is 25.62 kg/m.  General:  WDWN in NAD HENT: WNL Eyes: Pupils equal Pulmonary: normal non-labored breathing , without Rales, rhonchi,  wheezing Cardiac: RRR, without  Murmurs, rubs or gallops; No carotid bruits Abdomen: soft, NT, no masses Skin: no rashes, ulcers noted;  no Gangrene , no cellulitis; no open wounds;   Vascular Exam/Pulses:palpable radial pulses B.     Musculoskeletal: no muscle wasting or atrophy; no edema  Neurologic: A&O X 3; Appropriate Affect ;  SENSATION: normal; MOTOR FUNCTION: 5/5 right UE/LE, Left 3- LUE, 2- LLE hip/knee ext synergy , 0/5 at ankle and hip flexors  Speech is fluent/normal   Significant Diagnostic Studies: CBC Lab Results  Component Value Date   WBC 11.5 (H) 10/09/2019   HGB 7.6 (L) 10/09/2019   HCT 25.4 (L) 10/09/2019   MCV 93.7 10/09/2019   PLT 342 10/09/2019    BMET    Component Value Date/Time   NA 136 10/09/2019 1508   NA 143 11/08/2017 1030   K 3.4 (L) 10/09/2019 1508   CL 99 10/09/2019 1508   CO2 26 10/09/2019 1508   GLUCOSE 90 10/09/2019 1508   BUN 39 (H) 10/09/2019 1508   BUN 8 11/08/2017 1030   CREATININE 4.95 (H) 10/09/2019 1508   CREATININE 1.15 11/20/2014 1224   CALCIUM 9.7 10/09/2019 1508   CALCIUM 9.2 10/07/2019 1422   GFRNONAA 11 (L) 10/09/2019 1508   GFRAA 13 (L) 10/09/2019 1508   Estimated Creatinine Clearance: 15 mL/min (A) (by C-G formula based on SCr of 4.95 mg/dL  (H)).  COAG Lab Results  Component Value Date   INR 1.2 09/02/2019   INR 1.0 08/22/2019     Non-Invasive Vascular Imaging:   +-----------------+-------------+----------+--------------+ Right Cephalic   Diameter (cm)Depth (cm)   Findings    +-----------------+-------------+----------+--------------+ Shoulder             0.29        0.93                  +-----------------+-------------+----------+--------------+ Prox upper arm       0.33        0.50                  +-----------------+-------------+----------+--------------+ Mid upper arm        0.32        0.57                  +-----------------+-------------+----------+--------------+ Dist upper arm       0.28        0.44                  +-----------------+-------------+----------+--------------+ Antecubital fossa    0.29        0.18     branching    +-----------------+-------------+----------+--------------+ Prox forearm         0.26        0.32                  +-----------------+-------------+----------+--------------+ Mid forearm          0.31        0.38     branching    +-----------------+-------------+----------+--------------+ Dist forearm  0.12        0.18                  +-----------------+-------------+----------+--------------+ Wrist                                   not visualized +-----------------+-------------+----------+--------------+  +-----------------+-------------+----------+--------------+ Right Basilic    Diameter (cm)Depth (cm)   Findings    +-----------------+-------------+----------+--------------+ Prox upper arm                          not visualized +-----------------+-------------+----------+--------------+ Mid upper arm                           not visualized +-----------------+-------------+----------+--------------+ Dist upper arm                          not  visualized +-----------------+-------------+----------+--------------+ Antecubital fossa                       not visualized +-----------------+-------------+----------+--------------+ Prox forearm                            not visualized +-----------------+-------------+----------+--------------+ Mid forearm                             not visualized +-----------------+-------------+----------+--------------+ Distal forearm                          not visualized +-----------------+-------------+----------+--------------+ Wrist                                   not visualized +-----------------+-------------+----------+--------------+  +-----------------+-------------+----------+--------------+ Left Cephalic    Diameter (cm)Depth (cm)   Findings    +-----------------+-------------+----------+--------------+ Shoulder             0.23        0.79                  +-----------------+-------------+----------+--------------+ Prox upper arm       0.23        0.41                  +-----------------+-------------+----------+--------------+ Mid upper arm        0.23        0.64                  +-----------------+-------------+----------+--------------+ Dist upper arm       0.16        0.43     thrombosed   +-----------------+-------------+----------+--------------+ Antecubital fossa    0.25        0.38                  +-----------------+-------------+----------+--------------+ Prox forearm         0.25        0.27     thrombosed   +-----------------+-------------+----------+--------------+ Mid forearm          0.26        0.22     thrombosed   +-----------------+-------------+----------+--------------+ Dist forearm  not visualized +-----------------+-------------+----------+--------------+ Wrist                                   not  visualized +-----------------+-------------+----------+--------------+  +-----------------+-------------+----------+--------------+ Left Basilic     Diameter (cm)Depth (cm)   Findings    +-----------------+-------------+----------+--------------+ Prox upper arm       0.44                              +-----------------+-------------+----------+--------------+ Mid upper arm        0.46                              +-----------------+-------------+----------+--------------+ Dist upper arm       0.38                              +-----------------+-------------+----------+--------------+ Antecubital fossa                       not visualized +-----------------+-------------+----------+--------------+ Prox forearm                            not visualized +-----------------+-------------+----------+--------------+ Mid forearm                             not visualized +-----------------+-------------+----------+--------------+ Distal forearm                          not visualized +-----------------+-------------+----------+--------------+ Wrist                                   not visualized +-----------------+-------------+----------+--------------+  *See table(s) above for measurements and observations.     ASSESSMENT/PLAN:  COVID managed at [facility] admitted 07/13/2019 ESRD on HD for 2 months AKI on CKD He is right hand dominant we will plan left UE access likely Basilic, the cephalic is very small.  He is very weak almost flaccid on the left UE since the stroke.  Roxy Horseman 10/10/2019 11:52 AM  I agree with the above.  I have seen and evaluated the patient.  He is in need of permanent dialysis access.  He has had a stroke and has a flaccid left arm.  It appears that his basilic vein is adequate for fistula creation.  We discussed that this will most likely be a staged procedure.  The risks and benefits were discussed  with the patient including the risk of steal, nonmaturity, and the need for second operation.  He will be n.p.o. after midnight on Sunday.  Annamarie Major

## 2019-10-10 NOTE — Progress Notes (Signed)
Occupational Therapy Session Note  Patient Details  Name: [name] MRN: [id number] Date of Birth: 1951/05/02  Today's Date: 10/10/2019 OT Individual Time: 4097-3532 OT Individual Time Calculation (min): 59 min   Short Term Goals: Week 1:  OT Short Term Goal 1 (Week 1): Pt will complete toilet or BSC transfer with 2 assist for OOB toileting OT Short Term Goal 2 (Week 1): Pt will complete 1 grooming task w/c level at sink with mod vcs to include Lt limb OT Short Term Goal 3 (Week 1): Pt will don overhead shirt and initiate threading L UE first  Skilled Therapeutic Interventions/Progress Updates:    Pt greeted in bed, reporting he had BM in his brief and needed to be cleaned up. Rolling Lt>Rt completed with Mod-Max A respectively during perihygiene and brief change. 1 assist for tasks. LB bathing/dressing including donning Teds completed with Total A. Pt was able to use one legged bridge technique to pull pants up a little over Rt hip. Noted increased proximal activation in Lt UE today, facilitated HOH to assist with pulling pants up on Lt side. +2 for supine<sit with mattress set at max firmness. Worked on weight shifting forward while EOB due to posterior lean, needing 1 assist for balance while 2nd person set up slideboard transfer. +2 assist for slideboard<TIS. While sitting upright at the sink, pt engaged in UB bathing/dressing tasks with focus placed on Lt NMR. HOH for using Lt to wash Rt side and vcs + assist for limb protection and mgt during stated tasks. HOH for incorporating Lt during oral care as well. Mod A to weight shift forward towards sink for meeting demands of stated tasks as needed. He remained in TIS, reclined for safety and comfort with arm trough and safety belt. Pt agreeable to remain up to eat breakfast before RN transferred him back to bed for dressing change. Tx focus placed on ADL retraining, Lt NMR, Lt attention, and trunk control.   Therapy  Documentation Precautions:  Precautions Precautions: Fall Precaution Comments: Sacral decubitus wounds, Lt hemi Restrictions Weight Bearing Restrictions: No Vital Signs: Therapy Vitals Temp: 99 F (37.2 C) Temp Source: Oral Pulse Rate: (!) 110 Resp: 18 BP: 139/84 Patient Position (if appropriate): Lying Oxygen Therapy SpO2: 100 % O2 Device: Room Air Pain: in Lt knee during bed mobility, placed a pillow between legs while rolling to address. He did not want anything medicinally from RN to address   ADL: ADL Eating: Maximal assistance(for sitting balance) Where Assessed-Eating: Edge of bed Grooming: Maximal assistance Where Assessed-Grooming: Edge of bed Upper Body Bathing: Maximal assistance Where Assessed-Upper Body Bathing: Edge of bed Lower Body Bathing: Dependent(+2 assist) Where Assessed-Lower Body Bathing: Bed level Upper Body Dressing: Dependent Where Assessed-Upper Body Dressing: Edge of bed Lower Body Dressing: Dependent(+2 assist) Where Assessed-Lower Body Dressing: Bed level Toileting: Not assessed Toilet Transfer: Not assessed Tub/Shower Transfer: Not assessed      Therapy/Group: Individual Therapy  Latrell Potempa A Aracelly Tencza 10/10/2019, 4:29 PM

## 2019-10-11 ENCOUNTER — Inpatient Hospital Stay (HOSPITAL_COMMUNITY): Payer: Medicare HMO | Admitting: Occupational Therapy

## 2019-10-11 DIAGNOSIS — N179 Acute kidney failure, unspecified: Secondary | ICD-10-CM

## 2019-10-11 DIAGNOSIS — L89150 Pressure ulcer of sacral region, unstageable: Secondary | ICD-10-CM

## 2019-10-11 DIAGNOSIS — Z992 Dependence on renal dialysis: Secondary | ICD-10-CM

## 2019-10-11 DIAGNOSIS — I69391 Dysphagia following cerebral infarction: Secondary | ICD-10-CM

## 2019-10-11 DIAGNOSIS — D62 Acute posthemorrhagic anemia: Secondary | ICD-10-CM

## 2019-10-11 DIAGNOSIS — J9600 Acute respiratory failure, unspecified whether with hypoxia or hypercapnia: Secondary | ICD-10-CM

## 2019-10-11 LAB — RENAL FUNCTION PANEL
Albumin: 2.2 g/dL — ABNORMAL LOW (ref 3.5–5.0)
Anion gap: 6 (ref 5–15)
BUN: 35 mg/dL — ABNORMAL HIGH (ref 8–23)
CO2: 28 mmol/L (ref 22–32)
Calcium: 9.6 mg/dL (ref 8.9–10.3)
Chloride: 103 mmol/L (ref 98–111)
Creatinine, Ser: 4.51 mg/dL — ABNORMAL HIGH (ref 0.61–1.24)
GFR calc Af Amer: 15 mL/min — ABNORMAL LOW (ref >60)
GFR calc non Af Amer: 13 mL/min — ABNORMAL LOW (ref >60)
Glucose, Bld: 97 mg/dL (ref 70–99)
Phosphorus: 3.8 mg/dL (ref 2.5–4.6)
Potassium: 3.9 mmol/L (ref 3.5–5.1)
Sodium: 137 mmol/L (ref 135–145)

## 2019-10-11 LAB — CBC WITH DIFFERENTIAL/PLATELET
Abs Immature Granulocytes: 0.04 10*3/uL (ref 0.00–0.07)
Basophils Absolute: 0 10*3/uL (ref 0.0–0.1)
Basophils Relative: 0 %
Eosinophils Absolute: 0.4 10*3/uL (ref 0.0–0.5)
Eosinophils Relative: 5 %
HCT: 24.7 % — ABNORMAL LOW (ref 39.0–52.0)
Hemoglobin: 7.4 g/dL — ABNORMAL LOW (ref 13.0–17.0)
Immature Granulocytes: 1 %
Lymphocytes Relative: 19 %
Lymphs Abs: 1.4 10*3/uL (ref 0.7–4.0)
MCH: 28.1 pg (ref 26.0–34.0)
MCHC: 30 g/dL (ref 30.0–36.0)
MCV: 93.9 fL (ref 80.0–100.0)
Monocytes Absolute: 0.8 10*3/uL (ref 0.1–1.0)
Monocytes Relative: 11 %
Neutro Abs: 4.8 10*3/uL (ref 1.7–7.7)
Neutrophils Relative %: 64 %
Platelets: 321 10*3/uL (ref 150–400)
RBC: 2.63 MIL/uL — ABNORMAL LOW (ref 4.22–5.81)
RDW: 16.5 % — ABNORMAL HIGH (ref 11.5–15.5)
WBC: 7.5 10*3/uL (ref 4.0–10.5)
nRBC: 0 % (ref 0.0–0.2)

## 2019-10-11 MED ORDER — HEPARIN SODIUM (PORCINE) 1000 UNIT/ML IJ SOLN
INTRAMUSCULAR | Status: AC
  Administered 2019-10-11: 3800 [IU] via INTRAVENOUS_CENTRAL
  Filled 2019-10-11: qty 4

## 2019-10-11 MED ORDER — HYDROCERIN EX CREA
TOPICAL_CREAM | Freq: Three times a day (TID) | CUTANEOUS | Status: DC
  Administered 2019-10-22 – 2019-10-28 (×3): 1 via TOPICAL
  Filled 2019-10-11 (×2): qty 113

## 2019-10-11 MED ORDER — DARBEPOETIN ALFA 150 MCG/0.3ML IJ SOSY
PREFILLED_SYRINGE | INTRAMUSCULAR | Status: AC
  Filled 2019-10-11: qty 0.3

## 2019-10-11 MED ORDER — CHLORHEXIDINE GLUCONATE CLOTH 2 % EX PADS: 6.0000 | MEDICATED_PAD | Freq: Two times a day (BID) | CUTANEOUS | Status: DC

## 2019-10-11 NOTE — Progress Notes (Signed)
Occupational Therapy Session Note  Patient Details  Name: [name] MRN: [id number] Date of Birth: 11-04-1950  Today's Date: 10/11/2019 OT Individual Time: 1009-1050 OT Individual Time Calculation (min): 41 min   Short Term Goals: Week 1:  OT Short Term Goal 1 (Week 1): Pt will complete toilet or BSC transfer with 2 assist for OOB toileting OT Short Term Goal 2 (Week 1): Pt will complete 1 grooming task w/c level at sink with mod vcs to include Lt limb OT Short Term Goal 3 (Week 1): Pt will don overhead shirt and initiate threading L UE first  Skilled Therapeutic Interventions/Progress Updates:    Pt greeted in bed, in the process of "waking up" and sleepy, declining EOB or OOB activity. Wanting to work on his Lt arm. Started with repositioning in bed due to pts Lt lean. He was able to boost himself up in the Rt side once bed was placed in trendelenburg position. Assist provided for neutral trunk, though he was able to obtain neutral trunk with vcs using the bedrail for most of session. Passive stretching completed in shoulder flexion and shoulder abduction ~45 degrees, also shoulder external rotation stretch for 10 second holds. Note that pt with increased activation at elbow, shoulder internal rotators and external rotators! Visible and palpable bicep contractions during ROM and light resistance activities as well! Pt with developing strength in hand for grasping, needed A to fully extend digits for release. Able to flex/extend wrist with gravity minimized. Noted jerky ataxic like movements in wrist and hand today. His spouse [name] arrived near end of session, very pleased by new movement in his Lt arm. We celebrated! Pt was left in bed with Blanch Media present, Lt arm supported, all needs within reach.    Therapy Documentation Precautions:  Precautions Precautions: Fall Precaution Comments: Sacral decubitus wounds, Lt hemi Restrictions Weight Bearing Restrictions: No Vital Signs: Therapy  Vitals Temp: 98.7 F (37.1 C) Temp Source: Oral Pulse Rate: 100 Resp: 18 BP: 127/77 Patient Position (if appropriate): Lying Pain: Pt reported he no longer has Rt toe pain after spouse trimmed his toenail    ADL: ADL Eating: Maximal assistance(for sitting balance) Where Assessed-Eating: Edge of bed Grooming: Maximal assistance Where Assessed-Grooming: Edge of bed Upper Body Bathing: Maximal assistance Where Assessed-Upper Body Bathing: Edge of bed Lower Body Bathing: Dependent(+2 assist) Where Assessed-Lower Body Bathing: Bed level Upper Body Dressing: Dependent Where Assessed-Upper Body Dressing: Edge of bed Lower Body Dressing: Dependent(+2 assist) Where Assessed-Lower Body Dressing: Bed level Toileting: Not assessed Toilet Transfer: Not assessed Tub/Shower Transfer: Not assessed     Therapy/Group: Individual Therapy  Joshlynn Alfonzo A Floraine Buechler 10/11/2019, 4:30 PM

## 2019-10-11 NOTE — Progress Notes (Addendum)
[place] PHYSICAL MEDICINE & REHABILITATION PROGRESS NOTE   Subjective/Complaints: Patient seen laying in bed this morning.  He states he slept fairly overnight because his bed alarm kept going off.  Discussed dry skin with nursing.  He was seen by vascular surgery yesterday, notes reviewed.  ROS: Denies CP, SOB, N/V/D  Objective:   No results found. Recent Labs    10/09/19 1507 10/11/19 1403  WBC 11.5* 7.5  HGB 7.6* 7.4*  HCT 25.4* 24.7*  PLT 342 321   Recent Labs    10/09/19 1508 10/11/19 1402  NA 136 137  K 3.4* 3.9  CL 99 103  CO2 26 28  GLUCOSE 90 97  BUN 39* 35*  CREATININE 4.95* 4.51*  CALCIUM 9.7 9.6    Intake/Output Summary (Last 24 hours) at 10/11/2019 1720 Last data filed at 10/11/2019 1700 Gross per 24 hour  Intake --  Output 2148.1 ml  Net -2148.1 ml     Physical Exam: Vital Signs Blood pressure 134/85, pulse 98, temperature 97.9 F (36.6 C), temperature source Oral, resp. rate 16, height 5\' 10"  (1.778 m), weight 81 kg, SpO2 95 %. Constitutional: No distress . Vital signs reviewed. HENT: Normocephalic.  Atraumatic. Eyes: EOMI. No discharge. Cardiovascular: No JVD. Respiratory: Normal effort.  No stridor. GI: Non-distended. Skin: Warm and dry.  Intact. Psych: Normal mood.  Normal behavior. Musc: No edema in extremities.  No tenderness in extremities. Neuro: Alert Dysphonia Motor: RUE/RLE: 4/5 proximal distal LUE: 2/5 proximal distal LLE: Hip flexion, knee extension 3 -/5, ankle dorsiflexion 0/5  Assessment/Plan: 1. Functional deficits secondary to Left hemiparesis from RIght parietal infarct   which require 3+ hours per day of interdisciplinary therapy in a comprehensive inpatient rehab setting.  Physiatrist is providing close team supervision and 24 hour management of active medical problems listed below.  Physiatrist and rehab team continue to assess barriers to discharge/monitor patient progress toward functional and medical  goals  Care Tool:  Bathing    Body parts bathed by patient: Chest, Abdomen, Front perineal area, Right upper leg, Left upper leg, Face   Body parts bathed by helper: Buttocks, Right lower leg, Left lower leg     Bathing assist Assist Level: Maximal Assistance - Patient 24 - 49%     Upper Body Dressing/Undressing Upper body dressing   What is the patient wearing?: Pull over shirt    Upper body assist Assist Level: Maximal Assistance - Patient 25 - 49%    Lower Body Dressing/Undressing Lower body dressing      What is the patient wearing?: Pants, Incontinence brief     Lower body assist Assist for lower body dressing: Total Assistance - Patient < 25%     Toileting Toileting Toileting Activity did not occur (Clothing management and hygiene only): N/A (no void or bm)  Toileting assist Assist for toileting: Total Assistance - Patient < 25% Assistive Device Comment: urinal    Transfers Chair/bed transfer  Transfers assist  Chair/bed transfer activity did not occur: Safety/medical concerns  Chair/bed transfer assist level: 2 Helpers(sliding board)     Locomotion Ambulation   Ambulation assist   Ambulation activity did not occur: Safety/medical concerns          Walk 10 feet activity   Assist  Walk 10 feet activity did not occur: Safety/medical concerns        Walk 50 feet activity   Assist Walk 50 feet with 2 turns activity did not occur: Safety/medical concerns  Walk 150 feet activity   Assist Walk 150 feet activity did not occur: Safety/medical concerns         Walk 10 feet on uneven surface  activity   Assist Walk 10 feet on uneven surfaces activity did not occur: Safety/medical concerns         Wheelchair     Assist   Type of Wheelchair: Manual    Wheelchair assist level: Dependent - Patient 0%(TIS WC) Max wheelchair distance: 150    Wheelchair 50 feet with 2 turns activity    Assist        Assist  Level: Dependent - Patient 0%   Wheelchair 150 feet activity     Assist      Assist Level: Dependent - Patient 0%   Blood pressure 134/85, pulse 98, temperature 97.9 F (36.6 C), temperature source Oral, resp. rate 16, height 5\' 10"  (1.778 m), weight 81 kg, SpO2 95 %.  Medical Problem List and Plan: 1.  Debility secondary to acute hypoxemic RIght parietal infarct with Left hemiparesis LE>UE   Continue CIR 2.  Antithrombotics: -DVT/anticoagulation: Subcutaneous heparin. Negative  vascular study             -antiplatelet therapy: Aspirin 81 mg daily 3. Pain Management: Hycet as needed, Right great toe pain,improved cont  prevalon boots    Controlled on 12/19 4. Mood: Provide emotional support. Rozerem 8 mg nightly             -antipsychotic agents: N/A 5. Neuropsych: This patient is capable of making decisions on his own behalf.             SLP recommends additional diagnostics of higher level cognitive skills.  6. Skin/Wound Care: Wound care sacral decubitus with tunneling.   -pack Aquacel Ag+ twice daily for WOC  -woc rn consulted  -nutrition, turning, prevalon boots for heels- needs order to place qhs  7. Fluids/Electrolytes/Nutrition: Routine in and outs 8.  Anemia/melena.  Continue Aranesp  Hgb 7.6 on 12/17 9.  Tracheostomy 08/22/2019.  Decannulated.  Diet advanced to regular.  Continue nebulizers as directed 10.  MSSA bacteremia/pneumonia.  Antibiotic therapy completed 11.  Hyperlipidemia.  Lipitor 12.  Post stroke oropharyngeal dysphagia: Appreciate speech therapy recommendations; regular diet with oral care BID  -MMW for thrush 13.  HypoK receiving K+ bath in HD   14.  Renal failure after sepsis- on HD per Nephro - tent d/c 1/7 will need to make plans for OP HD   Plan for access by vascular 15.  Leukocytosis  WBCs 11.5 on 12/17  Afebrile  Continue to monitor  LOS: 8 days A FACE TO FACE EVALUATION WAS PERFORMED  [name] [name] 10/11/2019, 5:20 PM

## 2019-10-11 NOTE — Progress Notes (Addendum)
[facility] Progress Note   Subjective:   Patient seen and examined at bedside in dialysis.  No new complaints.    Objective Vitals:   10/10/19 2059 10/10/19 2254 10/11/19 0524 10/11/19 0823  BP:  135/88 133/84   Pulse:  (!) 107 90   Resp:  18 18   Temp:  98.1 F (36.7 C) 98.3 F (36.8 C)   TempSrc:   Oral   SpO2: 96% 98% 98% 95%  Weight:      Height:       Physical Exam General:NAD, chronically ill appearing male, laying in bed Heart:RRR Lungs:CTAB anteriorly  Abdomen:soft, NTND Extremities:no LE edema Dialysis Access: [facility] accessed   [facility] Weights   10/07/19 0517 10/07/19 1340 10/07/19 1718  Weight: 87 kg 83 kg 81 kg    Intake/Output Summary (Last 24 hours) at 10/11/2019 1349 Last data filed at 10/11/2019 0100 Gross per 24 hour  Intake 120 ml  Output 200 ml  Net -80 ml    Additional Objective Labs: Basic Metabolic Panel: Recent Labs  Lab 10/06/19 0650 10/07/19 1400 10/07/19 1422 10/09/19 1508  NA 136 135  --  136  K 3.5 3.2*  --  3.4*  CL 100 97*  --  99  CO2 28 27  --  26  GLUCOSE 87 139*  --  90  BUN 33* 50*  --  39*  CREATININE 4.55* 5.64*  --  4.95*  CALCIUM 9.6 9.7 9.2 9.7  PHOS  --  3.3  --  2.8   Liver Function Tests: Recent Labs  Lab 10/07/19 1400 10/09/19 1508  ALBUMIN 2.2* 2.3*   CBC: Recent Labs  Lab 10/06/19 0650 10/07/19 1400 10/09/19 1507  WBC 11.0* 12.5* 11.5*  HGB 7.6* 8.2* 7.6*  HCT 25.7* 27.1* 25.4*  MCV 93.1 94.4 93.7  PLT 307 339 342   Blood Culture    Component Value Date/Time   SDES WOUND COCCYX 10/09/2019 1154   SPECREQUEST NONE 10/09/2019 1154   CULT RARE SERRATIA MARCESCENS 10/09/2019 1154   REPTSTATUS PENDING 10/09/2019 1154     Medications:  . aspirin EC  81 mg Oral Daily  . atorvastatin  40 mg Oral q1800  . budesonide (PULMICORT) nebulizer solution  0.5 mg Nebulization BID  . chlorhexidine  15 mL Mouth/Throat BID  . Chlorhexidine Gluconate Cloth  6 each Topical BID  .  darbepoetin (ARANESP) injection - DIALYSIS  150 mcg Intravenous Q Sat-HD  . feeding supplement (NEPRO CARB STEADY)  237 mL Oral TID BM  . feeding supplement (PRO-STAT SUGAR FREE 64)  30 mL Oral BID  . heparin  5,000 Units Subcutaneous Q8H  . magic mouthwash w/lidocaine  5 mL Oral QID  . multivitamin  1 tablet Oral QHS  . pantoprazole  40 mg Oral Q1200  . ramelteon  8 mg Oral QHS  . senna-docusate  2 tablet Oral BID  . sucralfate  1 g Oral Q6H    Dialysis Orders: Dialysis dependent AKI presumed progressed to ESRD - outpatient HD has been arranged at [facility] 12:30 pm after d/c pending no change  Assessment/Plan: 1.S/p acute hypoxemia and respiratory failure due to COVID 15:VVOHYWVP complications/ secondary infections, prolonged ICU course. R chest tubeout,trach removed 11/30. Transferred to CIR 12/11 2. [place] near 2 month acute admission for COVID/PNA/hypoixc resp failure now on TTSschedule -Admitted 10/3 and started CRRT on 08/07/19, has been on RRT for a little under 2 mos now.  outpatient HD set up at [facility]. Foley  out - generally requiring added K bath.  no heparin due to GIB. Next HD on 12/19. 3.HTN/volume:BPwell controlled. Does not appear volume overload.  Not on antihypertensives. Need standing weights to help determine dry.  4. Anemia:Hgb7.7> 8.4> 7.6> 8.2>7.6HxGI bleed this admission. S/pIV iron. Continue Aranesp133mcg q Sat.- tsat23% 11/29 - on carafate - ok for short period of time but not long term due to risk of aluminium toxicity.  Transfuse prn. 5. Secondary hyperparathyroidism:CorrCa slightly high. Not on calcium or vit D, use low Ca bath- limited to 2.25 due to need for 4 K. Phos nowatgoal.iPTHsuppressed at 28 6.JPV:[id number] cortex and right cerebellum small acute to subacuteinfarctswithneuromuscular weakness/encephalopathy. 7. Nutrition: Alb very low - post COVID anorexia due to lack of taste. Addednepro +  pro-stat supplements. Diet liberalized 8.Sacral decub: Pressure ulcer - wound care following.Wound culture showed rare serratia marcescens, susceptibility pending.  Per [facility] nurse if drainage not "handled" by current tunneled dressing, then will likely need a surgery consult to investigate if an abscess is present.  9. Debility - per CIR 10. Code status DNR 11. Need for permanent access - pt agreed to permanent access placement.  Patient prefers to avoid perm access for now hoping that renal function will recover, but states he will if he has too.  VVS consulting. Plan for LUE access.   Jen Mow, PA-C [facility] Pager: [phone] 10/11/2019,1:49 PM  LOS: 8 days   I have seen and examined this patient and agree with plan and assessment in the above note with renal recommendations/intervention highlighted.  Plan for HD later today. Broadus John A Emmani Lesueur,MD 10/11/2019 2:15 PM

## 2019-10-12 ENCOUNTER — Inpatient Hospital Stay (HOSPITAL_COMMUNITY): Payer: Medicare HMO | Admitting: Occupational Therapy

## 2019-10-12 DIAGNOSIS — G479 Sleep disorder, unspecified: Secondary | ICD-10-CM

## 2019-10-12 DIAGNOSIS — D638 Anemia in other chronic diseases classified elsewhere: Secondary | ICD-10-CM

## 2019-10-12 LAB — SURGICAL PCR SCREEN
MRSA, PCR: NEGATIVE
Staphylococcus aureus: NEGATIVE

## 2019-10-12 MED ORDER — CHLORHEXIDINE GLUCONATE CLOTH 2 % EX PADS
6.0000 | MEDICATED_PAD | Freq: Two times a day (BID) | CUTANEOUS | Status: DC
  Administered 2019-10-13 – 2019-10-15 (×5): 6 via TOPICAL

## 2019-10-12 MED ORDER — MELATONIN 3 MG PO TABS
1.5000 mg | ORAL_TABLET | Freq: Every evening | ORAL | Status: DC | PRN
  Administered 2019-10-12 – 2019-10-14 (×3): 1.5 mg via ORAL
  Filled 2019-10-12 (×5): qty 0.5

## 2019-10-12 MED ORDER — SODIUM CHLORIDE 0.9 % IV SOLN
1.5000 g | INTRAVENOUS | Status: AC
  Filled 2019-10-12 (×2): qty 1.5

## 2019-10-12 NOTE — Progress Notes (Signed)
Occupational Therapy Session Note  Patient Details  Name: [name] MRN: [id number] Date of Birth: May 11, 1951  Today's Date: 10/12/2019 OT Individual Time: 1300-1327 OT Individual Time Calculation (min): 27 min   Short Term Goals: Week 1:  OT Short Term Goal 1 (Week 1): Pt will complete toilet or BSC transfer with 2 assist for OOB toileting OT Short Term Goal 2 (Week 1): Pt will complete 1 grooming task w/c level at sink with mod vcs to include Lt limb OT Short Term Goal 3 (Week 1): Pt will don overhead shirt and initiate threading L UE first  Skilled Therapeutic Interventions/Progress Updates:    Pt greeted in bed, finishing up lunch and taking his medicine from RN. OT facilitated active assist of Lt to drink his water while taking medicine. Afterwards, worked on [employer] and PROM of pts Lt UE. Decreased movement noted in shoulder external rotators today. Pt with active elbow and wrist motion against gravity, able to take a little resistance in forearm extension. Worked on pt bringing hand to chin with vcs for decreasing compensatory strategies. OT performed passive stretching of shoulder external rotation, flexion, and abduction. Educated pt on self ROM of digits and hand. He needed max cuing and multiple demonstrations due to cognitive deficits. Spouse [name] was present, initiated ROM education with her and she would benefit from hands on training at later time. Provided pt with a yellow foam sponge to keep working on functional grasp/release. Education emphasis placed on balancing flexor/extensor activity with mindful grasps and hand opening. He still exhibits ataxic, jerky movements in Lt hand with ROM. Pt and spouse appreciative of education. Left pt with all needs within reach.      Therapy Documentation Precautions:  Precautions Precautions: Fall Precaution Comments: Sacral decubitus wounds, Lt hemi Restrictions Weight Bearing Restrictions: No Pain: No c/o pain during session     ADL: ADL Eating: Maximal assistance(for sitting balance) Where Assessed-Eating: Edge of bed Grooming: Maximal assistance Where Assessed-Grooming: Edge of bed Upper Body Bathing: Maximal assistance Where Assessed-Upper Body Bathing: Edge of bed Lower Body Bathing: Dependent(+2 assist) Where Assessed-Lower Body Bathing: Bed level Upper Body Dressing: Dependent Where Assessed-Upper Body Dressing: Edge of bed Lower Body Dressing: Dependent(+2 assist) Where Assessed-Lower Body Dressing: Bed level Toileting: Not assessed Toilet Transfer: Not assessed Tub/Shower Transfer: Not assessed      Therapy/Group: Individual Therapy  [name] 10/12/2019, 4:11 PM

## 2019-10-12 NOTE — Progress Notes (Addendum)
[facility] Progress Note   Subjective:   Patient seen and examined in room with a wife present.  No new complaints.  Plans for permanent access placement tomorrow.   Objective Vitals:   10/11/19 1948 10/11/19 2144 10/12/19 0420 10/12/19 0912  BP: 124/85  140/85   Pulse: (!) 106 100 93   Resp: 16 18 15    Temp: 98.8 F (37.1 C)  98.7 F (37.1 C)   TempSrc:   Oral   SpO2: 98% 98% 99% 96%  Weight:      Height:       Physical Exam General:NAD, WDWN male, laying in bed Heart:RRR Lungs:CTAB anterolaterally  Abdomen:soft, NTND Extremities:no LE edema Dialysis Access: [facility]   Filed Weights   10/07/19 0517 10/07/19 1340 10/07/19 1718  Weight: 87 kg 83 kg 81 kg    Intake/Output Summary (Last 24 hours) at 10/12/2019 1424 Last data filed at 10/12/2019 3825 Gross per 24 hour  Intake 660 ml  Output 1998.1 ml  Net -1338.1 ml    Additional Objective Labs: Basic Metabolic Panel: Recent Labs  Lab 10/07/19 1400 10/07/19 1422 10/09/19 1508 10/11/19 1402  NA 135  --  136 137  K 3.2*  --  3.4* 3.9  CL 97*  --  99 103  CO2 27  --  26 28  GLUCOSE 139*  --  90 97  BUN 50*  --  39* 35*  CREATININE 5.64*  --  4.95* 4.51*  CALCIUM 9.7 9.2 9.7 9.6  PHOS 3.3  --  2.8 3.8   Liver Function Tests: Recent Labs  Lab 10/07/19 1400 10/09/19 1508 10/11/19 1402  ALBUMIN 2.2* 2.3* 2.2*   CBC: Recent Labs  Lab 10/06/19 0650 10/07/19 1400 10/09/19 1507 10/11/19 1403  WBC 11.0* 12.5* 11.5* 7.5  NEUTROABS  --   --   --  4.8  HGB 7.6* 8.2* 7.6* 7.4*  HCT 25.7* 27.1* 25.4* 24.7*  MCV 93.1 94.4 93.7 93.9  PLT 307 339 342 321   Blood Culture    Component Value Date/Time   SDES WOUND COCCYX 10/09/2019 1154   SPECREQUEST NONE 10/09/2019 1154   CULT  10/09/2019 1154    RARE SERRATIA MARCESCENS HOLDING FOR POSSIBLE ANAEROBE Performed at [facility], [place] [address] [place], [place] [address]    REPTSTATUS PENDING 10/09/2019 1154     Lab Results   Component Value Date   INR 1.2 09/02/2019   INR 1.0 08/22/2019   Studies/Results: No results found.  Medications: . [START ON 10/13/2019] cefUROXime (ZINACEF)  IV     . aspirin EC  81 mg Oral Daily  . atorvastatin  40 mg Oral q1800  . budesonide (PULMICORT) nebulizer solution  0.5 mg Nebulization BID  . chlorhexidine  15 mL Mouth/Throat BID  . Chlorhexidine Gluconate Cloth  6 each Topical BID  . darbepoetin (ARANESP) injection - DIALYSIS  150 mcg Intravenous Q Sat-HD  . feeding supplement (NEPRO CARB STEADY)  237 mL Oral TID BM  . feeding supplement (PRO-STAT SUGAR FREE 64)  30 mL Oral BID  . heparin  5,000 Units Subcutaneous Q8H  . hydrocerin   Topical TID  . magic mouthwash w/lidocaine  5 mL Oral QID  . multivitamin  1 tablet Oral QHS  . pantoprazole  40 mg Oral Q1200  . ramelteon  8 mg Oral QHS  . senna-docusate  2 tablet Oral BID  . sucralfate  1 g Oral Q6H    Dialysis Orders: Dialysis dependent AKI  presumed progressed to ESRD - outpatient HD has been arranged at [facility] 12:30 pm after d/c pending no change  Assessment/Plan: 1.S/p acute hypoxemia and respiratory failure due to COVID 23:NTIRWERX complications/ secondary infections, prolonged ICU course. R chest tubeout,trach removed 11/30. Transferred to CIR 12/11 2. [place] near 2 month acute admission for COVID/PNA/hypoixc resp failure now on [facility]TSschedule -Admitted 10/3 and started CRRT on 08/07/19, has been on RRT for a little under 2 mos now.  outpatient HD set up at [facility]. Foley out - generally requiring added K bath.no heparin due to GIB. Next HD on 12/22. 3.HTN/volume:BPwell controlled. Does not appear volume overload.Not on antihypertensives.Need standing weights to help determine dry. 4. Anemia:Hgb7.7> 8.4> 7.6> 8.2>7.6>7.4HxGI bleed this admission. S/pIV iron. Continue Aranesp184mcg q Sat.- tsat23% 11/29 - on carafate- ok for short period of time but not long  term due to risk of aluminium toxicity. Transfuse prn. 5. Secondary hyperparathyroidism:CorrCa slightly high. Not on calcium or vit D, use low Ca bath- limited to 2.25 due to need for 4 K. Phos nowatgoal.iPTHsuppressed at 28 6.VQM:[id number] cortex and right cerebellum small acute to subacuteinfarctswithneuromuscular weakness/encephalopathy. 7. Nutrition: Alb very low - post COVID anorexia due to lack of taste. Addednepro + pro-stat supplements. Diet liberalized. Encouraged nutrition.  8.Sacral decub: Pressure ulcer - wound care following.Wound culture showed rare serratia marcescens, susceptibility pending. Per [facility] nurse if drainage not "handled" by current tunneled dressing, then will likely need a surgery consult to investigate if an abscess is present.  9. Debility - per CIR 10. Code status DNR 11. Need for permanent access - pt agreed to permanent access placement.  Patient prefers to avoid perm access for now hoping that renal function will recover, but states he will if he has too.VVS consulting. Plan for LUE access on 10/13/19.   Jen Mow, PA-C [facility] Pager: [phone] 10/12/2019,2:24 PM  LOS: 9 days   I have seen and examined this patient and agree with plan and assessment in the above note with renal recommendations/intervention highlighted.  Continue with HD and PT/OT.  Discussed the need to proceed with AVF/aVG placement. Broadus John A Eiza Canniff,MD 10/12/2019 3:08 PM

## 2019-10-12 NOTE — Progress Notes (Signed)
Patient seen and examined today.  He is scheduled for a left arm fistula tomorrow.  He will be n.p.o. after midnight.  This will most likely be a for staged left basilic vein fistula creation.  He understands that if the vein matures he would need a second stage procedure.  We discussed the risk of nonmaturity as well as the risk of steal syndrome.  All questions were answered.  Annamarie Major

## 2019-10-12 NOTE — H&P (View-Only) (Signed)
Patient seen and examined today.  He is scheduled for a left arm fistula tomorrow.  He will be n.p.o. after midnight.  This will most likely be a for staged left basilic vein fistula creation.  He understands that if the vein matures he would need a second stage procedure.  We discussed the risk of nonmaturity as well as the risk of steal syndrome.  All questions were answered.  Annamarie Major

## 2019-10-12 NOTE — Plan of Care (Signed)
OT POC modified, please see POC for details

## 2019-10-12 NOTE — Progress Notes (Signed)
[facility] PHYSICAL MEDICINE & REHABILITATION PROGRESS NOTE   Subjective/Complaints: Patient seen laying in bed this morning.  He states he did not sleep well overnight because his bottom hurt, but it has improved this AM.  He request medications to help him sleep, discussed with nursing.  He was seen by vascular today, plans for left arm fistula tomorrow.  ROS: Denies CP, SOB, N/V/D  Objective:   No results found. Recent Labs    10/11/19 1403  WBC 7.5  HGB 7.4*  HCT 24.7*  PLT 321   Recent Labs    10/11/19 1402  NA 137  K 3.9  CL 103  CO2 28  GLUCOSE 97  BUN 35*  CREATININE 4.51*  CALCIUM 9.6    Intake/Output Summary (Last 24 hours) at 10/12/2019 1734 Last data filed at 10/12/2019 1450 Gross per 24 hour  Intake 900 ml  Output -  Net 900 ml     Physical Exam: Vital Signs Blood pressure 138/80, pulse 94, temperature 98.7 F (37.1 C), temperature source Oral, resp. rate 16, height 5\' 10"  (1.778 m), weight 81 kg, SpO2 98 %. Constitutional: No distress . Vital signs reviewed. HENT: Normocephalic.  Atraumatic. Eyes: EOMI. No discharge. Cardiovascular: No JVD. Respiratory: Normal effort.  No stridor. GI: Non-distended. Skin: Warm and dry.  Intact. Psych: Normal mood.  Normal behavior. Musc: No edema in extremities.  No tenderness in extremities. Neuro: Alert Dysphonia, unchanged Motor: RUE/RLE: 4/5 proximal distal LUE: Shoulder abduction, elbow flexion/extension 2/5, handgrip 0/5  LLE: Hip flexion, knee extension 3 -/5, ankle dorsiflexion 0/5  Assessment/Plan: 1. Functional deficits secondary to Left hemiparesis from RIght parietal infarct   which require 3+ hours per day of interdisciplinary therapy in a comprehensive inpatient rehab setting.  Physiatrist is providing close team supervision and 24 hour management of active medical problems listed below.  Physiatrist and rehab team continue to assess barriers to discharge/monitor patient progress toward  functional and medical goals  Care Tool:  Bathing    Body parts bathed by patient: Chest, Abdomen, Front perineal area, Right upper leg, Left upper leg, Face   Body parts bathed by helper: Buttocks, Right lower leg, Left lower leg     Bathing assist Assist Level: Maximal Assistance - Patient 24 - 49%     Upper Body Dressing/Undressing Upper body dressing   What is the patient wearing?: Pull over shirt    Upper body assist Assist Level: Maximal Assistance - Patient 25 - 49%    Lower Body Dressing/Undressing Lower body dressing      What is the patient wearing?: Pants, Incontinence brief     Lower body assist Assist for lower body dressing: Total Assistance - Patient < 25%     Toileting Toileting Toileting Activity did not occur (Clothing management and hygiene only): N/A (no void or bm)  Toileting assist Assist for toileting: Total Assistance - Patient < 25% Assistive Device Comment: urinal    Transfers Chair/bed transfer  Transfers assist  Chair/bed transfer activity did not occur: Safety/medical concerns  Chair/bed transfer assist level: 2 Helpers(sliding board)     Locomotion Ambulation   Ambulation assist   Ambulation activity did not occur: Safety/medical concerns          Walk 10 feet activity   Assist  Walk 10 feet activity did not occur: Safety/medical concerns        Walk 50 feet activity   Assist Walk 50 feet with 2 turns activity did not occur: Safety/medical concerns  Walk 150 feet activity   Assist Walk 150 feet activity did not occur: Safety/medical concerns         Walk 10 feet on uneven surface  activity   Assist Walk 10 feet on uneven surfaces activity did not occur: Safety/medical concerns         Wheelchair     Assist   Type of Wheelchair: Manual    Wheelchair assist level: Dependent - Patient 0%(TIS WC) Max wheelchair distance: 150    Wheelchair 50 feet with 2 turns  activity    Assist        Assist Level: Dependent - Patient 0%   Wheelchair 150 feet activity     Assist      Assist Level: Dependent - Patient 0%   Blood pressure 138/80, pulse 94, temperature 98.7 F (37.1 C), temperature source Oral, resp. rate 16, height 5\' 10"  (1.778 m), weight 81 kg, SpO2 98 %.  Medical Problem List and Plan: 1.  Debility secondary to acute hypoxemic RIght parietal infarct with Left hemiparesis LE>UE   Continue CIR 2.  Antithrombotics: -DVT/anticoagulation: Subcutaneous heparin. Negative  vascular study             -antiplatelet therapy: Aspirin 81 mg daily 3. Pain Management: Hycet as needed, Right great toe pain,improved cont  prevalon boots    Relatively controlled on 12/20 4. Mood: Provide emotional support. Rozerem 8 mg nightly             -antipsychotic agents: N/A 5. Neuropsych: This patient is capable of making decisions on his own behalf.             SLP recommends additional diagnostics of higher level cognitive skills.  6. Skin/Wound Care: Wound care sacral decubitus with tunneling.   -pack Aquacel Ag+ twice daily for WOC  -woc rn consulted  -nutrition, turning, prevalon boots for heels- needs order to place qhs  7. Fluids/Electrolytes/Nutrition: Routine in and outs 8.  Anemia/melena.  Continue Aranesp  Hgb 7.4 on 12/19 9.  Tracheostomy 08/22/2019.  Decannulated.  Diet advanced to regular.  Continue nebulizers as directed 10.  MSSA bacteremia/pneumonia.  Antibiotic therapy completed 11.  Hyperlipidemia.  Lipitor 12.  Post stroke oropharyngeal dysphagia: Appreciate speech therapy recommendations; regular diet with oral care BID  -MMW for thrush 13.  Renal failure after sepsis- on HD per Nephro - tent d/c 1/7 will need to make plans for OP HD   Plan for left arm fistula tomorrow  14.  Leukocytosis: Resolved  WBCs 7.5 on 12/19  Afebrile  Continue to monitor 15.  Sleep disturbance  Melatonin started on 12/20  LOS: 9 days A FACE  TO FACE EVALUATION WAS PERFORMED  Ankit [name] [name] 10/12/2019, 5:34 PM

## 2019-10-12 NOTE — Progress Notes (Signed)
Notified Dr. Posey Pronto r/t SQ Heparin states to administer 2200 doses and  Hold 0600 secondary to surgical procedule

## 2019-10-12 NOTE — Progress Notes (Addendum)
Occupational Therapy Weekly Progress Note  Patient Details  Name: [name] MRN: [id number] Date of Birth: [birth year]  Beginning of progress report period: 10/04/2019 End of progress report period: 10/12/2019  Patient has met 1 of 3 short term goals.    Pt has made slow progress at time of report. He presently requires +2 for slideboard transfers and +2 to Total A of 1 for self care tasks completed bedlevel. He is now exhibiting more activation in his Lt arm proximally and distally. At time of eval, his Lt arm was flaccid. Continue OT POC. Goals have been readjusted to reflect his progress.    Patient continues to demonstrate the following deficits: muscle weakness and muscle paralysis, decreased cardiorespiratoy endurance, abnormal tone, unbalanced muscle activation and decreased coordination, decreased midline orientation, decreased initiation, decreased attention and decreased problem solving and decreased sitting balance, decreased standing balance, decreased postural control and hemiplegia and therefore will continue to benefit from skilled OT intervention to enhance overall performance with BADL.  Patient is not progressing toward long term goals..  Please see POC for LTG adjustments.   OT Short Term Goals Week 1:  OT Short Term Goal 1 (Week 1): Pt will complete toilet or BSC transfer with 2 assist for OOB toileting OT Short Term Goal 1 - Progress (Week 1): Progressing toward goal OT Short Term Goal 2 (Week 1): Pt will complete 1 grooming task w/c level at sink with mod vcs to include Lt limb OT Short Term Goal 2 - Progress (Week 1): Met OT Short Term Goal 3 (Week 1): Pt will don overhead shirt and initiate threading L UE first OT Short Term Goal 3 - Progress (Week 1): Progressing toward goal Week 2:  OT Short Term Goal 1 (Week 2): Pt will demonstrate carryover of 1 self ROM exercise with mod vcs OT Short Term Goal 2 (Week 2): Pt will complete BSC transfer with 2 assist for OOB  toileting OT Short Term Goal 3 (Week 2): Pt will complete 1 self care task EOB with no more than Mod A for balance  Therapy Documentation Precautions:  Precautions Precautions: Fall Precaution Comments: Sacral decubitus wounds, Lt hemi Restrictions Weight Bearing Restrictions: No Vital Signs: Oxygen Therapy SpO2: 96 % O2 Device: Room Air ADL: ADL Eating: Maximal assistance(for sitting balance) Where Assessed-Eating: Edge of bed Grooming: Maximal assistance Where Assessed-Grooming: Edge of bed Upper Body Bathing: Maximal assistance Where Assessed-Upper Body Bathing: Edge of bed Lower Body Bathing: Dependent(+2 assist) Where Assessed-Lower Body Bathing: Bed level Upper Body Dressing: Dependent Where Assessed-Upper Body Dressing: Edge of bed Lower Body Dressing: Dependent(+2 assist) Where Assessed-Lower Body Dressing: Bed level Toileting: Not assessed Toilet Transfer: Not assessed Tub/Shower Transfer: Not assessed     Therapy/Group: Individual Therapy  Trevor Wilkie A Climmie Buelow 10/12/2019, 12:11 PM

## 2019-10-13 ENCOUNTER — Inpatient Hospital Stay (HOSPITAL_COMMUNITY): Payer: Medicare HMO

## 2019-10-13 ENCOUNTER — Inpatient Hospital Stay (HOSPITAL_COMMUNITY): Payer: Medicare HMO | Admitting: Certified Registered Nurse Anesthetist

## 2019-10-13 ENCOUNTER — Inpatient Hospital Stay (HOSPITAL_COMMUNITY): Payer: Medicare HMO | Admitting: Occupational Therapy

## 2019-10-13 ENCOUNTER — Encounter (HOSPITAL_COMMUNITY)
Admission: RE | Disposition: A | Discharge status: Home Health | Payer: Self-pay | Source: Intra-hospital | Attending: Physical Medicine & Rehabilitation

## 2019-10-13 ENCOUNTER — Encounter (HOSPITAL_COMMUNITY): Payer: Self-pay | Admitting: Physical Medicine & Rehabilitation

## 2019-10-13 ENCOUNTER — Inpatient Hospital Stay: Admit: 2019-10-13 | Payer: Medicare HMO | Admitting: Vascular Surgery

## 2019-10-13 HISTORY — PX: BASCILIC VEIN TRANSPOSITION: SHX5742

## 2019-10-13 LAB — AEROBIC/ANAEROBIC CULTURE W GRAM STAIN (SURGICAL/DEEP WOUND)

## 2019-10-13 LAB — BASIC METABOLIC PANEL
Anion gap: 9 (ref 5–15)
BUN: 30 mg/dL — ABNORMAL HIGH (ref 8–23)
CO2: 27 mmol/L (ref 22–32)
Calcium: 10.3 mg/dL (ref 8.9–10.3)
Chloride: 102 mmol/L (ref 98–111)
Creatinine, Ser: 4.13 mg/dL — ABNORMAL HIGH (ref 0.61–1.24)
GFR calc Af Amer: 16 mL/min — ABNORMAL LOW (ref >60)
GFR calc non Af Amer: 14 mL/min — ABNORMAL LOW (ref >60)
Glucose, Bld: 114 mg/dL — ABNORMAL HIGH (ref 70–99)
Potassium: 4.4 mmol/L (ref 3.5–5.1)
Sodium: 138 mmol/L (ref 135–145)

## 2019-10-13 LAB — POCT I-STAT, CHEM 8
BUN: 28 mg/dL — ABNORMAL HIGH (ref 8–23)
Calcium, Ion: 1.23 mmol/L (ref 1.15–1.40)
Chloride: 101 mmol/L (ref 98–111)
Creatinine, Ser: 3.8 mg/dL — ABNORMAL HIGH (ref 0.61–1.24)
Glucose, Bld: 94 mg/dL (ref 70–99)
HCT: 26 % — ABNORMAL LOW (ref 39.0–52.0)
Hemoglobin: 8.8 g/dL — ABNORMAL LOW (ref 13.0–17.0)
Potassium: 4 mmol/L (ref 3.5–5.1)
Sodium: 135 mmol/L (ref 135–145)
TCO2: 27 mmol/L (ref 22–32)

## 2019-10-13 LAB — CBC
HCT: 30.9 % — ABNORMAL LOW (ref 39.0–52.0)
Hemoglobin: 9 g/dL — ABNORMAL LOW (ref 13.0–17.0)
MCH: 27.6 pg (ref 26.0–34.0)
MCHC: 29.1 g/dL — ABNORMAL LOW (ref 30.0–36.0)
MCV: 94.8 fL (ref 80.0–100.0)
Platelets: 350 10*3/uL (ref 150–400)
RBC: 3.26 MIL/uL — ABNORMAL LOW (ref 4.22–5.81)
RDW: 16 % — ABNORMAL HIGH (ref 11.5–15.5)
WBC: 8.6 10*3/uL (ref 4.0–10.5)
nRBC: 0 % (ref 0.0–0.2)

## 2019-10-13 LAB — PROTIME-INR
INR: 1.1 (ref 0.8–1.2)
Prothrombin Time: 13.8 seconds (ref 11.4–15.2)

## 2019-10-13 SURGERY — TRANSPOSITION, VEIN, BASILIC
Anesthesia: General | Laterality: Left

## 2019-10-13 SURGERY — ARTERIOVENOUS (AV) FISTULA CREATION
Anesthesia: Choice | Laterality: Left

## 2019-10-13 MED ORDER — HEPARIN SODIUM (PORCINE) 1000 UNIT/ML IJ SOLN
INTRAMUSCULAR | Status: AC
  Filled 2019-10-13: qty 1

## 2019-10-13 MED ORDER — FENTANYL CITRATE (PF) 250 MCG/5ML IJ SOLN
INTRAMUSCULAR | Status: AC
  Filled 2019-10-13: qty 5

## 2019-10-13 MED ORDER — LIDOCAINE HCL (PF) 1 % IJ SOLN
INTRAMUSCULAR | Status: AC
  Filled 2019-10-13: qty 30

## 2019-10-13 MED ORDER — LIDOCAINE-EPINEPHRINE 1 %-1:100000 IJ SOLN
INTRAMUSCULAR | Status: AC
  Filled 2019-10-13: qty 1

## 2019-10-13 MED ORDER — SODIUM CHLORIDE 0.9 % IV SOLN
INTRAVENOUS | Status: DC | PRN
  Administered 2019-10-13: 1.5 g via INTRAVENOUS

## 2019-10-13 MED ORDER — FENTANYL CITRATE (PF) 100 MCG/2ML IJ SOLN: 25.0000 ug | INTRAMUSCULAR | Status: DC | PRN

## 2019-10-13 MED ORDER — SODIUM CHLORIDE 0.9 % IV SOLN
INTRAVENOUS | Status: DC | PRN
  Administered 2019-10-13: 500 mL

## 2019-10-13 MED ORDER — HEPARIN SODIUM (PORCINE) 1000 UNIT/ML IJ SOLN
1900.0000 [IU] | Freq: Once | INTRAMUSCULAR | Status: AC
  Administered 2019-10-13: 1900 [IU]
  Filled 2019-10-13: qty 1.9

## 2019-10-13 MED ORDER — SODIUM CHLORIDE 0.9 % IV SOLN: INTRAVENOUS | Status: DC

## 2019-10-13 MED ORDER — PHENYLEPHRINE HCL-NACL 10-0.9 MG/250ML-% IV SOLN
INTRAVENOUS | Status: DC | PRN
  Administered 2019-10-13: 25 ug/min via INTRAVENOUS

## 2019-10-13 MED ORDER — PHENYLEPHRINE 40 MCG/ML (10ML) SYRINGE FOR IV PUSH (FOR BLOOD PRESSURE SUPPORT)
PREFILLED_SYRINGE | INTRAVENOUS | Status: DC | PRN
  Administered 2019-10-13: 80 ug via INTRAVENOUS

## 2019-10-13 MED ORDER — DEXAMETHASONE SODIUM PHOSPHATE 10 MG/ML IJ SOLN
INTRAMUSCULAR | Status: DC | PRN
  Administered 2019-10-13: 4 mg via INTRAVENOUS

## 2019-10-13 MED ORDER — SODIUM CHLORIDE 0.9 % IV SOLN
INTRAVENOUS | Status: AC
  Filled 2019-10-13: qty 1.2

## 2019-10-13 MED ORDER — PROPOFOL 10 MG/ML IV BOLUS
INTRAVENOUS | Status: DC | PRN
  Administered 2019-10-13 (×2): 50 mg via INTRAVENOUS
  Administered 2019-10-13: 100 mg via INTRAVENOUS

## 2019-10-13 MED ORDER — 0.9 % SODIUM CHLORIDE (POUR BTL) OPTIME
TOPICAL | Status: DC | PRN
  Administered 2019-10-13: 1000 mL

## 2019-10-13 MED ORDER — CHLORHEXIDINE GLUCONATE CLOTH 2 % EX PADS
6.0000 | MEDICATED_PAD | Freq: Every day | CUTANEOUS | Status: DC
  Administered 2019-10-14: 6 via TOPICAL

## 2019-10-13 MED ORDER — HEPARIN SODIUM (PORCINE) 1000 UNIT/ML IJ SOLN
INTRAMUSCULAR | Status: DC | PRN
  Administered 2019-10-13: 8000 [IU] via INTRAVENOUS

## 2019-10-13 MED ORDER — LIDOCAINE-EPINEPHRINE (PF) 1 %-1:200000 IJ SOLN
INTRAMUSCULAR | Status: DC | PRN
  Administered 2019-10-13: 7.5 mL

## 2019-10-13 MED ORDER — PROTAMINE SULFATE 10 MG/ML IV SOLN
INTRAVENOUS | Status: DC | PRN
  Administered 2019-10-13: 40 mg via INTRAVENOUS

## 2019-10-13 MED ORDER — FENTANYL CITRATE (PF) 250 MCG/5ML IJ SOLN
INTRAMUSCULAR | Status: DC | PRN
  Administered 2019-10-13 (×2): 25 ug via INTRAVENOUS

## 2019-10-13 MED ORDER — ONDANSETRON HCL 4 MG/2ML IJ SOLN: 4.0000 mg | Freq: Once | INTRAMUSCULAR | Status: DC | PRN

## 2019-10-13 MED ORDER — ONDANSETRON HCL 4 MG/2ML IJ SOLN
INTRAMUSCULAR | Status: DC | PRN
  Administered 2019-10-13: 4 mg via INTRAVENOUS

## 2019-10-13 SURGICAL SUPPLY — 47 items
ARMBAND PINK RESTRICT EXTREMIT (MISCELLANEOUS) ×4 IMPLANT
BOOT SUTURE AID YELLOW STND (SUTURE) ×4 IMPLANT
CANISTER SUCT 3000ML PPV (MISCELLANEOUS) ×4 IMPLANT
CANNULA VESSEL 3MM 2 BLNT TIP (CANNULA) ×8 IMPLANT
CLIP VESOCCLUDE MED 6/CT (CLIP) ×8 IMPLANT
CLIP VESOCCLUDE SM WIDE 6/CT (CLIP) ×16 IMPLANT
COVER PROBE W GEL 5X96 (DRAPES) IMPLANT
COVER WAND RF STERILE (DRAPES) ×4 IMPLANT
DECANTER SPIKE VIAL GLASS SM (MISCELLANEOUS) ×4 IMPLANT
DERMABOND ADVANCED (GAUZE/BANDAGES/DRESSINGS) ×2
DERMABOND ADVANCED .7 DNX12 (GAUZE/BANDAGES/DRESSINGS) ×2 IMPLANT
DRAPE IMP U-DRAPE 54X76 (DRAPES) ×4 IMPLANT
DRAPE ORTHO SPLIT 77X108 STRL (DRAPES) ×2
DRAPE SURG ORHT 6 SPLT 77X108 (DRAPES) ×2 IMPLANT
ELECT CAUTERY BLADE 6.4 (BLADE) ×4 IMPLANT
ELECT REM PT RETURN 9FT ADLT (ELECTROSURGICAL) ×4
ELECTRODE REM PT RTRN 9FT ADLT (ELECTROSURGICAL) ×2 IMPLANT
GLOVE BIO SURGEON STRL SZ7.5 (GLOVE) ×4 IMPLANT
GLOVE BIOGEL PI IND STRL 8 (GLOVE) ×2 IMPLANT
GLOVE BIOGEL PI INDICATOR 8 (GLOVE) ×2
GOWN STRL REUS W/ TWL LRG LVL3 (GOWN DISPOSABLE) ×6 IMPLANT
GOWN STRL REUS W/TWL LRG LVL3 (GOWN DISPOSABLE) ×6
KIT BASIN OR (CUSTOM PROCEDURE TRAY) ×4 IMPLANT
KIT TURNOVER KIT B (KITS) ×4 IMPLANT
LOOP VESSEL MAXI BLUE (MISCELLANEOUS) ×4 IMPLANT
LOOP VESSEL MINI RED (MISCELLANEOUS) ×4 IMPLANT
NS IRRIG 1000ML POUR BTL (IV SOLUTION) ×4 IMPLANT
PACK CV ACCESS (CUSTOM PROCEDURE TRAY) IMPLANT
PACK SURGICAL SETUP 50X90 (CUSTOM PROCEDURE TRAY) ×4 IMPLANT
PAD ARMBOARD 7.5X6 YLW CONV (MISCELLANEOUS) ×8 IMPLANT
PENCIL SMOKE EVACUATOR (MISCELLANEOUS) ×4 IMPLANT
SPONGE LAP 18X18 RF (DISPOSABLE) ×4 IMPLANT
SPONGE SURGIFOAM ABS GEL 100 (HEMOSTASIS) IMPLANT
SUT PROLENE 6 0 BV (SUTURE) ×12 IMPLANT
SUT SILK 2 0 (SUTURE) ×2
SUT SILK 2-0 18XBRD TIE 12 (SUTURE) ×2 IMPLANT
SUT SILK 3 0 (SUTURE) ×4
SUT SILK 3-0 18XBRD TIE 12 (SUTURE) ×4 IMPLANT
SUT VIC AB 3-0 SH 27 (SUTURE) ×2
SUT VIC AB 3-0 SH 27X BRD (SUTURE) ×2 IMPLANT
SUT VICRYL 4-0 PS2 18IN ABS (SUTURE) ×4 IMPLANT
SYR BULB IRRIGATION 50ML (SYRINGE) ×4 IMPLANT
TOWEL GREEN STERILE (TOWEL DISPOSABLE) ×4 IMPLANT
TUBE CONNECTING 12'X1/4 (SUCTIONS) ×1
TUBE CONNECTING 12X1/4 (SUCTIONS) ×3 IMPLANT
UNDERPAD 30X30 (UNDERPADS AND DIAPERS) ×4 IMPLANT
WATER STERILE IRR 1000ML POUR (IV SOLUTION) ×4 IMPLANT

## 2019-10-13 NOTE — Progress Notes (Signed)
Occupational Therapy Session Note  Patient Details  Name: [name] MRN: [id number] Date of Birth: [birth year]  Today's Date: 10/13/2019 OT Individual Time:  - 60 minutes missed     Short Term Goals: Week 2:  OT Short Term Goal 1 (Week 2): Pt will demonstrate carryover of 1 self ROM exercise with mod vcs OT Short Term Goal 2 (Week 2): Pt will complete BSC transfer with 2 assist for OOB toileting OT Short Term Goal 3 (Week 2): Pt will complete 1 self care task EOB with no more than Mod A for balance  Skilled Therapeutic Interventions/Progress Updates:    Pt seen for scheduled OT and was off unit for fistula procedure. 60 minutes missed.   Therapy Documentation Precautions:  Precautions Precautions: Fall Precaution Comments: Sacral decubitus wounds, Lt hemi Restrictions Weight Bearing Restrictions: No Vital Signs: Therapy Vitals Temp: 98.4 F (36.9 C) Pulse Rate: 94 Resp: 16 BP: (!) 144/84 Patient Position (if appropriate): Lying Oxygen Therapy SpO2: 95 % O2 Device: Room Air ADL: ADL Eating: Maximal assistance(for sitting balance) Where Assessed-Eating: Edge of bed Grooming: Maximal assistance Where Assessed-Grooming: Edge of bed Upper Body Bathing: Maximal assistance Where Assessed-Upper Body Bathing: Edge of bed Lower Body Bathing: Dependent(+2 assist) Where Assessed-Lower Body Bathing: Bed level Upper Body Dressing: Dependent Where Assessed-Upper Body Dressing: Edge of bed Lower Body Dressing: Dependent(+2 assist) Where Assessed-Lower Body Dressing: Bed level Toileting: Not assessed Toilet Transfer: Not assessed Tub/Shower Transfer: Not assessed      Therapy/Group: Individual Therapy  Nikea Settle A Laith Antonelli 10/13/2019, 7:16 AM

## 2019-10-13 NOTE — Progress Notes (Signed)
[facility] ROUNDING NOTE   Subjective:   This is a 68 year old male who presented to [facility] 07/26/2019 he had previously been diagnosed with COVID-19 July 13, 2019.  He had increasing oxygen requirements and required with steroids and remdesivir which she completed on 07/30/2019 did require intubation for airway protection.  He was also treated with vancomycin and cefepime through to 08/02/2019.  CT scan of chest was negative for pulmonary emboli.  He was admitted to [place].  His creatinine was elevated at 1.51 renal was consulted for AKI and he needed dialysis 08/07/2019.  He was deemed chronic end-stage renal disease and required dialysis on a TTS schedule.  GI has been following for ongoing blood loss and melena stools.  An endoscopy showed no evidence of active bleeding or ulcers in the stomach.  He has a long history of being intubated and required a tracheostomy 08/22/2019 since then the patient has been extubated.  Hospital course has been complicated by bouts of atrial fibrillation with rapid ventricular rate.  He also had altered mental status neurology was consulted 08/27/2019.  MRI showed small acute to early subacute infarcts bilateral cerebral cortex.  He also developed sacral decubitus and is being managed with wound care and partial-thickness areas of tissue loss at posterior aspects of the penile shaft.  He underwent placement of a basilic vein transposition first stage 10/13/2019.  Appreciate assistance from Dr. Scot Dock.  Blood pressure 132/82 pulse 94 temperature 97.6 O2 sats 98% room air  Last dialysis session 2.1 L removed 10/11/2019  Sodium 138 potassium 4.4 chloride 102 CO2 27 BUN 30 creatinine 4.13 glucose 114 calcium 10.3 phosphorus 3.8 albumin 2.2  WBC 7.5 hemoglobin 7.4 platelets 321  Aspirin 81 mg daily Lipitor 40 mg daily Pulmicort nebulizer twice daily, darbepoetin 150 mcg q. Saturday, multivitamins 1 daily Protonix 40 mg  daily ramelteon 8 mg nightly   Objective:  Vital signs in last 24 hours:  Temp:  [97.4 F (36.3 C)-99.1 F (37.3 C)] 97.6 F (36.4 C) (12/21 1106) Pulse Rate:  [88-98] 93 (12/21 1106) Resp:  [16-20] 18 (12/21 1106) BP: (121-144)/(80-93) 132/81 (12/21 1106) SpO2:  [95 %-100 %] 98 % (12/21 1106)  Weight change:  Filed Weights   10/07/19 0517 10/07/19 1340 10/07/19 1718  Weight: 87 kg 83 kg 81 kg    Intake/Output: I/O last 3 completed shifts: In: 1060 [P.O.:1060] Out: 100 [Urine:100]   Intake/Output this shift:  Total I/O In: 500 [I.V.:500] Out: 15 [Blood:15]  General:NAD, WDWN male, laying in bed Heart:RRR Lungs:CTAB anterolaterally  Abdomen:soft, NTND Extremities:no LE edema Dialysis Access: [facility]    Basic Metabolic Panel: Recent Labs  Lab 10/07/19 1400 10/09/19 1508 10/11/19 1402 10/13/19 0900 10/13/19 1245  NA 135 136 137 135 138  K 3.2* 3.4* 3.9 4.0 4.4  CL 97* 99 103 101 102  CO2 27 26 28   --  27  GLUCOSE 139* 90 97 94 114*  BUN 50* 39* 35* 28* 30*  CREATININE 5.64* 4.95* 4.51* 3.80* 4.13*  CALCIUM 9.7 9.7 9.6  --  10.3  PHOS 3.3 2.8 3.8  --   --     Liver Function Tests: Recent Labs  Lab 10/07/19 1400 10/09/19 1508 10/11/19 1402  ALBUMIN 2.2* 2.3* 2.2*   No results for input(s): LIPASE, AMYLASE in the last 168 hours. No results for input(s): AMMONIA in the last 168 hours.  CBC: Recent Labs  Lab 10/07/19 1400 10/09/19 1507 10/11/19 1403 10/13/19 0900 10/13/19 1245  WBC 12.5* 11.5* 7.5  --  8.6  NEUTROABS  --   --  4.8  --   --   HGB 8.2* 7.6* 7.4* 8.8* 9.0*  HCT 27.1* 25.4* 24.7* 26.0* 30.9*  MCV 94.4 93.7 93.9  --  94.8  PLT 339 342 321  --  350    Cardiac Enzymes: No results for input(s): CKTOTAL, CKMB, CKMBINDEX, TROPONINI in the last 168 hours.  BNP: Invalid input(s): POCBNP  CBG: No results for input(s): GLUCAP in the last 168 hours.  Microbiology: Results for orders placed or performed during the hospital encounter  of 10/03/19  Aerobic/Anaerobic Culture (surgical/deep wound)     Status: None (Preliminary result)   Collection Time: 10/09/19 11:54 AM   Specimen: Wound  Result Value Ref Range Status   Specimen Description WOUND COCCYX  Final   Special Requests NONE  Final   Gram Stain   Final    RARE WBC PRESENT, PREDOMINANTLY PMN NO ORGANISMS SEEN    Culture   Final    RARE SERRATIA MARCESCENS HOLDING FOR POSSIBLE ANAEROBE Performed at [facility], [address], [place], [place] [address]    Report Status PENDING  Incomplete   Organism ID, Bacteria SERRATIA MARCESCENS  Final      Susceptibility   Serratia marcescens - MIC*    CEFAZOLIN >=64 RESISTANT Resistant     CEFEPIME <=1 SENSITIVE Sensitive     CEFTAZIDIME <=1 SENSITIVE Sensitive     CEFTRIAXONE <=1 SENSITIVE Sensitive     CIPROFLOXACIN <=0.25 SENSITIVE Sensitive     GENTAMICIN <=1 SENSITIVE Sensitive     TRIMETH/SULFA <=20 SENSITIVE Sensitive     * RARE SERRATIA MARCESCENS  Surgical pcr screen     Status: None   Collection Time: 10/12/19 11:06 AM   Specimen: Nasal Mucosa; Nasal Swab  Result Value Ref Range Status   MRSA, PCR NEGATIVE NEGATIVE Final   Staphylococcus aureus NEGATIVE NEGATIVE Final    Comment: (NOTE) The Xpert SA Assay (FDA approved for NASAL specimens in patients 45 years of age and older), is one component of a comprehensive surveillance program. It is not intended to diagnose infection nor to guide or monitor treatment. Performed at [facility], [place] [address], [place], [place] [address]     Coagulation Studies: Recent Labs    10/13/19 1245  LABPROT 13.8  INR 1.1    Urinalysis: No results for input(s): COLORURINE, LABSPEC, PHURINE, GLUCOSEU, HGBUR, BILIRUBINUR, KETONESUR, PROTEINUR, UROBILINOGEN, NITRITE, LEUKOCYTESUR in the last 72 hours.  Invalid input(s): APPERANCEUR    Imaging: No results found.   Medications:   . cefUROXime (ZINACEF)  IV Stopped (10/13/19 0539)   .  aspirin EC  81 mg Oral Daily  . atorvastatin  40 mg Oral q1800  . budesonide (PULMICORT) nebulizer solution  0.5 mg Nebulization BID  . chlorhexidine  15 mL Mouth/Throat BID  . Chlorhexidine Gluconate Cloth  6 each Topical BID  . darbepoetin (ARANESP) injection - DIALYSIS  150 mcg Intravenous Q Sat-HD  . feeding supplement (NEPRO CARB STEADY)  237 mL Oral TID BM  . feeding supplement (PRO-STAT SUGAR FREE 64)  30 mL Oral BID  . heparin  5,000 Units Subcutaneous Q8H  . hydrocerin   Topical TID  . magic mouthwash w/lidocaine  5 mL Oral QID  . multivitamin  1 tablet Oral QHS  . pantoprazole  40 mg Oral Q1200  . ramelteon  8 mg Oral QHS  . senna-docusate  2 tablet Oral  BID  . sucralfate  1 g Oral Q6H   acetaminophen (TYLENOL) oral liquid 160 mg/5 mL, HYDROcodone-acetaminophen, ipratropium-albuterol, Melatonin, polyethylene glycol, polyvinyl alcohol, sorbitol, white petrolatum  Assessment/ Plan:  1.S/p acute hypoxemia and respiratory failure due to COVID 20:[id number] complications/ secondary infections, prolonged ICU course. R chest tubeout,trach removed 11/30. Transferred to CIR 12/11 2.[place] near 2 month acute admission for COVID/PNA/hypoixc resp failure now on TTSschedule -Admitted 10/3 and started CRRT on 08/07/19, has been on RRT for a little under 2 mos now. outpatient HD set up at [facility]. Foley out - generally requiring added K bath.no heparin due to GIB. Next HD on 12/22. 3.HTN/volume:BPwell controlled.  4. Anemia:  Continue Aranesp134mcg q Sat.-    5. Secondary hyperparathyroidism:CorrCa slightly high. Not on calcium or vit D, use low Ca bath- limited to 2.25 due to need for 4 K. Phos nowatgoal.iPTHsuppressed at 28 6.QAE:SLPNPYYFRTMYT cortex and right cerebellum small acute to subacuteinfarctswithneuromuscular weakness/encephalopathy. 7.Nutrition: Alb very low - post COVID anorexia due to lack of taste. Addednepro + pro-stat supplements.  Diet liberalized. Encouraged nutrition.  8.Sacral decub: Pressure ulcer - wound care following.Wound culture showed rare serratia marcescens, susceptibility pending. Per [facility] nurse if drainage not "handled" by current tunneled dressing, then will likely need a surgery consult to investigate if an abscess is present.  9. Debility- per CIR 10. Code status DNR 11. Need for permanent access-    left first stage basilic vein transposition 10/13/2019    LOS: [place] @TODAY @1 :35 PM

## 2019-10-13 NOTE — Progress Notes (Signed)
[facility] PHYSICAL MEDICINE & REHABILITATION PROGRESS NOTE   Subjective/Complaints: Left arm fistula performed today  Patient feels okay post procedure. Wife is at bedside.  We discussed that he will need wheelchair after he leaves the hospital.  We also discussed that his rehabilitation will be a several month process.  ROS: Denies CP, SOB, N/V/D  Objective:   No results found. Recent Labs    10/11/19 1403 10/13/19 0900  WBC 7.5  --   HGB 7.4* 8.8*  HCT 24.7* 26.0*  PLT 321  --    Recent Labs    10/11/19 1402 10/13/19 0900  NA 137 135  K 3.9 4.0  CL 103 101  CO2 28  --   GLUCOSE 97 94  BUN 35* 28*  CREATININE 4.51* 3.80*  CALCIUM 9.6  --     Intake/Output Summary (Last 24 hours) at 10/13/2019 1206 Last data filed at 10/13/2019 1024 Gross per 24 hour  Intake 1080 ml  Output 115 ml  Net 965 ml     Physical Exam: Vital Signs Blood pressure 132/81, pulse 93, temperature 97.6 F (36.4 C), resp. rate 18, height 5\' 10"  (1.778 m), weight 81 kg, SpO2 98 %. Constitutional: No distress . Vital signs reviewed. HENT: Normocephalic.  Atraumatic. Eyes: EOMI. No discharge. Cardiovascular: No JVD. Respiratory: Normal effort.  No stridor. GI: Non-distended. Skin: Warm and dry.  Intact. Psych: Normal mood.  Normal behavior. Musc: No edema in extremities.  No tenderness in extremities. Neuro: Alert Dysphonia, unchanged Motor: RUE/RLE: 4/5 proximal distal LUE: Shoulder abduction, elbow flexion/extension 2/5, handgrip 0/5  LLE: Hip flexion, knee extension 3 -/5, ankle dorsiflexion 0/5  Assessment/Plan: 1. Functional deficits secondary to Left hemiparesis from RIght parietal infarct   which require 3+ hours per day of interdisciplinary therapy in a comprehensive inpatient rehab setting.  Physiatrist is providing close team supervision and 24 hour management of active medical problems listed below.  Physiatrist and rehab team continue to assess barriers to  discharge/monitor patient progress toward functional and medical goals  Care Tool:  Bathing    Body parts bathed by patient: Chest, Abdomen, Front perineal area, Right upper leg, Left upper leg, Face   Body parts bathed by helper: Buttocks, Right lower leg, Left lower leg     Bathing assist Assist Level: Maximal Assistance - Patient 24 - 49%     Upper Body Dressing/Undressing Upper body dressing   What is the patient wearing?: Pull over shirt    Upper body assist Assist Level: Maximal Assistance - Patient 25 - 49%    Lower Body Dressing/Undressing Lower body dressing      What is the patient wearing?: Pants, Incontinence brief     Lower body assist Assist for lower body dressing: Total Assistance - Patient < 25%     Toileting Toileting Toileting Activity did not occur (Clothing management and hygiene only): N/A (no void or bm)  Toileting assist Assist for toileting: Total Assistance - Patient < 25% Assistive Device Comment: urinal    Transfers Chair/bed transfer  Transfers assist  Chair/bed transfer activity did not occur: Safety/medical concerns  Chair/bed transfer assist level: 2 Helpers(sliding board)     Locomotion Ambulation   Ambulation assist   Ambulation activity did not occur: Safety/medical concerns          Walk 10 feet activity   Assist  Walk 10 feet activity did not occur: Safety/medical concerns        Walk 50 feet activity   Assist Walk  50 feet with 2 turns activity did not occur: Safety/medical concerns         Walk 150 feet activity   Assist Walk 150 feet activity did not occur: Safety/medical concerns         Walk 10 feet on uneven surface  activity   Assist Walk 10 feet on uneven surfaces activity did not occur: Safety/medical concerns         Wheelchair     Assist   Type of Wheelchair: Manual    Wheelchair assist level: Dependent - Patient 0%(TIS WC) Max wheelchair distance: 150     Wheelchair 50 feet with 2 turns activity    Assist        Assist Level: Dependent - Patient 0%   Wheelchair 150 feet activity     Assist      Assist Level: Dependent - Patient 0%   Blood pressure 132/81, pulse 93, temperature 97.6 F (36.4 C), resp. rate 18, height 5\' 10"  (1.778 m), weight 81 kg, SpO2 98 %.  Medical Problem List and Plan: 1.  Debility secondary to acute hypoxemic RIght parietal infarct with Left hemiparesis LE>UE   Continue CIR PT, OT-no therapy today post procedure still groggy may resume tomorrow 2.  Antithrombotics: -DVT/anticoagulation: Subcutaneous heparin. Negative  vascular study             -antiplatelet therapy: Aspirin 81 mg daily 3. Pain Management: Hycet as needed, Right great toe pain,improved cont  prevalon boots    Relatively controlled on 12/20 4. Mood: Provide emotional support. Rozerem 8 mg nightly             -antipsychotic agents: N/A 5. Neuropsych: This patient is capable of making decisions on his own behalf.             SLP recommends additional diagnostics of higher level cognitive skills.  6. Skin/Wound Care: Wound care sacral decubitus with tunneling.   -pack Aquacel Ag+ twice daily for WOC  -woc rn consulted  -nutrition, turning, prevalon boots for heels- needs order to place qhs  7. Fluids/Electrolytes/Nutrition: Routine in and outs 8.  Anemia/melena.  Continue Aranesp  Hgb 7.4 on 12/19. 8.8 on 12/21 9.  Tracheostomy 08/22/2019.  Decannulated.  Diet advanced to regular.  Continue nebulizers as directed 10.  MSSA bacteremia/pneumonia.  Antibiotic therapy completed 11.  Hyperlipidemia.  Lipitor 12.  Post stroke oropharyngeal dysphagia: Appreciate speech therapy recommendations; regular diet with oral care BID  -MMW for thrush 13.  Renal failure after sepsis- on HD per Nephro - tent d/c 1/7 will need to make plans for OP HD   Fistula today  14.  Leukocytosis: Resolved  WBCs 7.5 on 12/19  Afebrile  Continue to  monitor 15.  Sleep disturbance  Melatonin started on 12/20  LOS: 10 days A FACE TO [place] E Primrose Oler 10/13/2019, 12:06 PM

## 2019-10-13 NOTE — Anesthesia Preprocedure Evaluation (Addendum)
Anesthesia Evaluation  Patient identified by MRN, date of birth, ID band Patient awake    Reviewed: Allergy & Precautions, NPO status , Patient's Chart, lab work & pertinent test results  Airway Mallampati: III  TM Distance: >3 FB Neck ROM: Full    Dental no notable dental hx.    Pulmonary  S/p tracheostomy due to COVID   Pulmonary exam normal breath sounds clear to auscultation       Cardiovascular negative cardio ROS Normal cardiovascular exam Rhythm:Regular Rate:Normal  ECG: rate 95. Normal sinus rhythm Possible Left atrial enlargement Right bundle branch block Possible Right ventricular hypertrophy  ECHO:   1. Left ventricular ejection fraction, by visual estimation, is 60 to 65%. The left ventricle has normal function. Normal left ventricular size. There is severely increased left ventricular hypertrophy.  2. Left ventricular diastolic Doppler parameters are indeterminate pattern of LV diastolic filling.  3. Hyperdynamic LV function SAM not seen but appears to be a small LVOT gradient.  4. Global right ventricle has normal systolic function.The right ventricular size is normal. No increase in right ventricular wall thickness.  5. Left atrial size was normal.  6. Right atrial size was normal.  7. The mitral valve is normal in structure. Trace mitral valve regurgitation. No evidence of mitral stenosis.  8. The tricuspid valve is normal in structure. Tricuspid valve regurgitation is mild.  9. The aortic valve is normal in structure. Aortic valve regurgitation was not visualized by color flow Doppler. Structurally normal aortic valve, with no evidence of sclerosis or stenosis. 10. The pulmonic valve was not well visualized. Pulmonic valve regurgitation was not assessed by color flow Doppler. 11. The inferior vena cava is normal in size with greater than 50% respiratory variability, suggesting right atrial pressure of 3  mmHg. 12. The interatrial septum was not well visualized.   Neuro/Psych CVA (left sided paralysis ), Residual Symptoms negative psych ROS   GI/Hepatic Neg liver ROS, PUD,   Endo/Other  negative endocrine ROS  Renal/GU ESRF and DialysisRenal diseaseOn HS T, R, Sat     Musculoskeletal negative musculoskeletal ROS (+)   Abdominal   Peds  Hematology  (+) anemia , HLD   Anesthesia Other Findings ESRD  Reproductive/Obstetrics                            Anesthesia Physical Anesthesia Plan  ASA: IV  Anesthesia Plan: General   Post-op Pain Management:    Induction: Intravenous  PONV Risk Score and Plan: 2 and Ondansetron, Dexamethasone and Treatment may vary due to age or medical condition  Airway Management Planned: LMA  Additional Equipment:   Intra-op Plan:   Post-operative Plan:   Informed Consent: I have reviewed the patients History and Physical, chart, labs and discussed the procedure including the risks, benefits and alternatives for the proposed anesthesia with the patient or authorized representative who has indicated his/her understanding and acceptance.   Patient has DNR.  Suspend DNR.   Dental advisory given  Plan Discussed with: CRNA  Anesthesia Plan Comments:       Anesthesia Quick Evaluation

## 2019-10-13 NOTE — Transfer of Care (Signed)
Immediate Anesthesia Transfer of Care Note  Patient: [name]  Procedure(s) Performed: Basilic Vein Transposition Left Arm  Patient Location: PACU  Anesthesia Type:General  Level of Consciousness: drowsy  Airway & Oxygen Therapy: Patient Spontanous Breathing and Patient connected to face mask oxygen  Post-op Assessment: Report given to RN and Post -op Vital signs reviewed and stable  Post vital signs: Reviewed and stable  Last Vitals:  Vitals Value Taken Time  BP 121/89 10/13/19 1036  Temp 36.3 C 10/13/19 1036  Pulse 81 10/13/19 1039  Resp 26 10/13/19 1039  SpO2 100 % 10/13/19 1039  Vitals shown include unvalidated device data.  Last Pain:  Vitals:   10/12/19 1942  TempSrc:   PainSc: 0-No pain      Patients Stated Pain Goal: 2 (44/81/85 6314)  Complications: No apparent anesthesia complications

## 2019-10-13 NOTE — Progress Notes (Signed)
Patient is NPO after MN anticipating surgical procedure in the morning , aware of restrictions.

## 2019-10-13 NOTE — Progress Notes (Signed)
Pt was unavailable due to procedure and missed 60 minutes of skilled ST services.

## 2019-10-13 NOTE — Anesthesia Procedure Notes (Signed)
Procedure Name: LMA Insertion Date/Time: 10/13/2019 9:17 AM Performed by: Bryson Corona, CRNA Pre-anesthesia Checklist: Patient identified, Emergency Drugs available, Suction available and Patient being monitored Patient Re-evaluated:Patient Re-evaluated prior to induction Oxygen Delivery Method: Circle System Utilized Preoxygenation: Pre-oxygenation with 100% oxygen Induction Type: IV induction LMA: LMA inserted LMA Size: 5.0 Number of attempts: 1 Placement Confirmation: positive ETCO2 Tube secured with: Tape Dental Injury: Teeth and Oropharynx as per pre-operative assessment

## 2019-10-13 NOTE — Progress Notes (Signed)
Nutrition Follow-up  RD working remotely.  DOCUMENTATION CODES:   Not applicable  INTERVENTION:   - Continue Nepro Shake poTID, each supplement provides 425 kcal and 19 grams protein  - Continue Pro-stat 30 ml BID, each supplement provides 100 kcals and 15 grams protein.  - Liberalize diet back to Regular per discussion with MD  - Continue renal MVI daily at bedtime  - Encourage adequate PO intake  NUTRITION DIAGNOSIS:   Increased nutrient needs related to acute illness, wound healing as evidenced by estimated needs.  Ongoing, being addressed via oral nutrition supplements  GOAL:   Patient will meet greater than or equal to 90% of their needs  Progressing  MONITOR:   PO intake, Supplement acceptance, Labs, Weight trends, Skin  REASON FOR ASSESSMENT:   Consult Wound healing  ASSESSMENT:   68 yo male admitted to CIR on 12/11 after long hospitalization due to respiratory failure from COVID 19 pna, AKI requiring CRRT and ultimately iHD. PMH include COVID 19 infection, heart burn  12/21 - s/p first stage L basilic vein transpoisition  Noted target d/c date of 1/07.  Unable to reach pt via phone call to room despite multiple attempts. Busy signal received on all attempts.  Per Nephrology note on 12/15, "food has very little taste" per pt.  Pt on renal diet after procedure this AM. Spoke with MD. Faythe Ghee to liberalize diet back to Regular.  Pt accepting most Nepro shakes and Pro-stat per [facility] documentation.  Noted pt with a total weight loss of 9 lbs since admission to CIR. Will continue to monitor trends.  Meal Completion: 50-95% x last 8 meals  Medications reviewed and include: Aranesp, Nepro TID, Pro-stat BID,   Labs reviewed: hemoglobin 8.8  NUTRITION - FOCUSED PHYSICAL EXAM:  Unable to complete at this time. RD working remotely.  Diet Order:   Diet Order            Diet regular Room service appropriate? Yes; Fluid consistency: Thin  Diet  effective now              EDUCATION NEEDS:   Not appropriate for education at this time  Skin:  Skin Assessment: Skin Integrity Issues: Stage III: sacrum (per WOC note)  Last BM:  10/13/19 type 6  Height:   Ht Readings from Last 1 Encounters:  10/03/19 5\' 10"  (1.778 m)    Weight:   Wt Readings from Last 1 Encounters:  10/02/19 74.1 kg    Ideal Body Weight:  75.5 kg  BMI:  Body mass index is 25.62 kg/m.  Estimated Nutritional Needs:   Kcal:  2200-2400 kcals  Protein:  115-140 grams  Fluid:  1000 mL plus UOP    Gaynell Face, MS, RD, LDN Inpatient Clinical Dietitian Pager: [phone] Weekend/After Hours: [phone]

## 2019-10-13 NOTE — Anesthesia Postprocedure Evaluation (Signed)
Anesthesia Post Note  Patient: Heath Tesler  Procedure(s) Performed: Basilic Vein Transposition Left Arm     Patient location during evaluation: PACU Anesthesia Type: General Level of consciousness: awake and alert Pain management: pain level controlled Vital Signs Assessment: post-procedure vital signs reviewed and stable Respiratory status: spontaneous breathing, nonlabored ventilation, respiratory function stable and patient connected to nasal cannula oxygen Cardiovascular status: blood pressure returned to baseline and stable Postop Assessment: no apparent nausea or vomiting Anesthetic complications: no    Last Vitals:  Vitals:   10/13/19 1106 10/13/19 1427  BP: 132/81 128/68  Pulse: 93 (!) 116  Resp: 18 18  Temp: 36.4 C 37.1 C  SpO2: 98% 98%    Last Pain:  Vitals:   10/13/19 1427  TempSrc: Oral  PainSc:                  Ryan P Ellender

## 2019-10-13 NOTE — Op Note (Signed)
    NAME: [name]    MRN: [id number] DOB: 1951/04/04    DATE OF OPERATION: 10/13/2019  PREOP DIAGNOSIS:    End-stage renal disease  POSTOP DIAGNOSIS:    Same  PROCEDURE:    First stage left basilic vein transposition  SURGEON: Judeth Cornfield. Scot Dock, MD  ASSIST: Laurence Slate, PA  ANESTHESIA: General  EBL: Minimal  INDICATIONS:    [name] is a 68 y.o. male who presents for new access.  FINDINGS:   3 mm basilic vein  TECHNIQUE:   The patient was taken to the operating room and I looked at the basilic vein with the SonoSite.  The vein was somewhat marginal in size and therefore as had been recommended we elected to do a first stage basilic vein transposition.  The patient received a general anesthetic.  The left arm was prepped and draped in the usual sterile fashion.  A longitudinal incision was made over the basilic vein just above the antecubital level.  Here the vein was dissected free and branches were divided between clips and 3-0 silk ties.  This was ligated distally and irrigated up with heparinized saline the same incision I dissected out the brachial artery beneath the fascia.  The patient was heparinized.  The brachial artery was clamped proximally distally and a longitudinal arteriotomy was made.  The vein was sewn into side to the artery using continuous 6-0 Prolene suture.  Completion was a good thrill in the fistula.  There was a palpable radial pulse.  The heparin was partially reversed with protamine.  The wound was closed with deep layer of 3-0 Vicryl and the skin closed with 4-0 Vicryl.  Dermabond was applied.  Patient tolerated the procedure well was transferred recovery in stable condition.  All needle and sponge counts were correct.  [name], MD, FACS [facility]  DATE OF DICTATION:   10/13/2019

## 2019-10-13 NOTE — Interval H&P Note (Signed)
History and Physical Interval Note:  10/13/2019 7:47 AM  [name]  has presented today for surgery, with the diagnosis of ESRD.  The various methods of treatment have been discussed with the patient and family. After consideration of risks, benefits and other options for treatment, the patient has consented to  Procedure(s): ARTERIOVENOUS (AV) FISTULA CREATION (Left) as a surgical intervention.  The patient's history has been reviewed, patient examined, no change in status, stable for surgery.  I have reviewed the patient's chart and labs.  Questions were answered to the patient's satisfaction.     [name]

## 2019-10-14 ENCOUNTER — Inpatient Hospital Stay (HOSPITAL_COMMUNITY): Payer: Medicare HMO | Admitting: Physical Therapy

## 2019-10-14 ENCOUNTER — Inpatient Hospital Stay (HOSPITAL_COMMUNITY): Payer: Medicare HMO | Admitting: Occupational Therapy

## 2019-10-14 ENCOUNTER — Inpatient Hospital Stay (HOSPITAL_COMMUNITY): Payer: Medicare HMO

## 2019-10-14 LAB — RENAL FUNCTION PANEL
Albumin: 2.6 g/dL — ABNORMAL LOW (ref 3.5–5.0)
Anion gap: 10 (ref 5–15)
BUN: 11 mg/dL (ref 8–23)
CO2: 28 mmol/L (ref 22–32)
Calcium: 8.6 mg/dL — ABNORMAL LOW (ref 8.9–10.3)
Chloride: 97 mmol/L — ABNORMAL LOW (ref 98–111)
Creatinine, Ser: 2.22 mg/dL — ABNORMAL HIGH (ref 0.61–1.24)
GFR calc Af Amer: 34 mL/min — ABNORMAL LOW (ref >60)
GFR calc non Af Amer: 30 mL/min — ABNORMAL LOW (ref >60)
Glucose, Bld: 105 mg/dL — ABNORMAL HIGH (ref 70–99)
Phosphorus: 1.5 mg/dL — ABNORMAL LOW (ref 2.5–4.6)
Potassium: 3.5 mmol/L (ref 3.5–5.1)
Sodium: 135 mmol/L (ref 135–145)

## 2019-10-14 LAB — CBC
HCT: 27.7 % — ABNORMAL LOW (ref 39.0–52.0)
Hemoglobin: 8.3 g/dL — ABNORMAL LOW (ref 13.0–17.0)
MCH: 27.9 pg (ref 26.0–34.0)
MCHC: 30 g/dL (ref 30.0–36.0)
MCV: 93.3 fL (ref 80.0–100.0)
Platelets: 296 10*3/uL (ref 150–400)
RBC: 2.97 MIL/uL — ABNORMAL LOW (ref 4.22–5.81)
RDW: 16.3 % — ABNORMAL HIGH (ref 11.5–15.5)
WBC: 9.9 10*3/uL (ref 4.0–10.5)
nRBC: 0.2 % (ref 0.0–0.2)

## 2019-10-14 MED ORDER — ALTEPLASE 2 MG IJ SOLR: 2.0000 mg | Freq: Once | INTRAMUSCULAR | Status: DC | PRN

## 2019-10-14 MED ORDER — HEPARIN SODIUM (PORCINE) 1000 UNIT/ML DIALYSIS: 1000.0000 [IU] | INTRAMUSCULAR | Status: DC | PRN

## 2019-10-14 MED ORDER — LIDOCAINE HCL (PF) 1 % IJ SOLN
5.0000 mL | INTRAMUSCULAR | Status: DC | PRN
  Filled 2019-10-14: qty 5

## 2019-10-14 MED ORDER — PENTAFLUOROPROP-TETRAFLUOROETH EX AERO: 1.0000 "application " | INHALATION_SPRAY | CUTANEOUS | Status: DC | PRN

## 2019-10-14 MED ORDER — LIDOCAINE-PRILOCAINE 2.5-2.5 % EX CREA: 1.0000 "application " | TOPICAL_CREAM | CUTANEOUS | Status: DC | PRN

## 2019-10-14 MED ORDER — SODIUM CHLORIDE 0.9 % IV SOLN: 100.0000 mL | INTRAVENOUS | Status: DC | PRN

## 2019-10-14 MED ORDER — LIDOCAINE HCL (PF) 1 % IJ SOLN: 5.0000 mL | INTRAMUSCULAR | Status: DC | PRN

## 2019-10-14 MED ORDER — DICLOFENAC SODIUM 1 % EX GEL
2.0000 g | Freq: Four times a day (QID) | CUTANEOUS | Status: DC
  Administered 2019-10-14 – 2019-11-05 (×58): 2 g via TOPICAL
  Filled 2019-10-14: qty 100

## 2019-10-14 MED ORDER — METRONIDAZOLE 500 MG PO TABS
500.0000 mg | ORAL_TABLET | Freq: Three times a day (TID) | ORAL | Status: DC
  Administered 2019-10-14: 500 mg via ORAL
  Filled 2019-10-14: qty 1

## 2019-10-14 MED ORDER — HEPARIN SODIUM (PORCINE) 1000 UNIT/ML IJ SOLN
INTRAMUSCULAR | Status: AC
  Administered 2019-10-14: 1000 [IU] via INTRAVENOUS_CENTRAL
  Filled 2019-10-14: qty 1

## 2019-10-14 MED ORDER — METRONIDAZOLE 500 MG PO TABS
500.0000 mg | ORAL_TABLET | Freq: Three times a day (TID) | ORAL | Status: AC
  Administered 2019-10-15 – 2019-10-27 (×37): 500 mg via ORAL
  Filled 2019-10-14 (×39): qty 1

## 2019-10-14 MED ORDER — SODIUM CHLORIDE 0.9 % IV SOLN
1.0000 g | INTRAVENOUS | Status: AC
  Administered 2019-10-14 – 2019-10-27 (×14): 1 g via INTRAVENOUS
  Filled 2019-10-14 (×6): qty 1
  Filled 2019-10-14: qty 10
  Filled 2019-10-14 (×7): qty 1

## 2019-10-14 MED ORDER — HEPARIN SODIUM (PORCINE) 1000 UNIT/ML DIALYSIS
1000.0000 [IU] | INTRAMUSCULAR | Status: DC | PRN
  Filled 2019-10-14 (×3): qty 1

## 2019-10-14 MED ORDER — LIDOCAINE-PRILOCAINE 2.5-2.5 % EX CREA
1.0000 "application " | TOPICAL_CREAM | CUTANEOUS | Status: DC | PRN
  Filled 2019-10-14: qty 5

## 2019-10-14 NOTE — Progress Notes (Signed)
Occupational Therapy Session Note  Patient Details  Name: [name] MRN: [id number] Date of Birth: 19-Mar-1951  Today's Date: 10/14/2019 OT Individual Time: 0940-1020 OT Individual Time Calculation (min): 40 min    Short Term Goals: Week 1:  OT Short Term Goal 1 (Week 1): Pt will complete toilet or BSC transfer with 2 assist for OOB toileting OT Short Term Goal 1 - Progress (Week 1): Progressing toward goal OT Short Term Goal 2 (Week 1): Pt will complete 1 grooming task w/c level at sink with mod vcs to include Lt limb OT Short Term Goal 2 - Progress (Week 1): Met OT Short Term Goal 3 (Week 1): Pt will don overhead shirt and initiate threading L UE first OT Short Term Goal 3 - Progress (Week 1): Progressing toward goal Week 2:  OT Short Term Goal 1 (Week 2): Pt will demonstrate carryover of 1 self ROM exercise with mod vcs OT Short Term Goal 2 (Week 2): Pt will complete BSC transfer with 2 assist for OOB toileting OT Short Term Goal 3 (Week 2): Pt will complete 1 self care task EOB with no more than Mod A for balance Week 3:     Skilled Therapeutic Interventions/Progress Updates:    1:1 Pt was in bed when arrived but agreeable to get up. Worked in the room today around nursing hooking up IV for meds. Pt came to EOB with max A with extra time. Pt initially requires min to mod A to find and maintain sitting balance on air mattress but then fades to contact guard/ min A. Pt's shoes donned with total A. Focus on sit to stands from slightly elevated bed with three musketeer style to help promote upright trunk posture. Pt performed 4 sit to stands with mod A +2 to come up but difficulty maintaining. Pt with difficulty initiating terminal hip and knee extension in standing and manual facilitation to achieve full upright trunk posture. Pt able to hold standing position for ~10 seconds. Pt perform squat pivot transfer to the right with increased buttock clearance today (with mod A +2)!! Towards the  right into the tilt in space chair. Sx PAs came into assess pt's wound on buttock. Performed slide board transfer with max A to the left and returned to supine with max A. Left with PAs and nursing.   Therapy Documentation Precautions:  Precautions Precautions: Fall Precaution Comments: Sacral decubitus wounds, Lt hemi Restrictions Weight Bearing Restrictions: No General:   Vital Signs: Therapy Vitals Pulse Rate: 92 Resp: 18 Patient Position (if appropriate): Lying Oxygen Therapy SpO2: 100 % O2 Device: Room Air FiO2 (%): 21 % Pain: Pt continues to complain about left knee with flexion - but with rest and avoiding extreme flexion in left LE  Therapy/Group: Individual Therapy  Willeen Cass [facility] 10/14/2019, 11:52 AM

## 2019-10-14 NOTE — Progress Notes (Signed)
Physical Therapy Session Note  Patient Details  Name: [name] MRN: [id number] Date of Birth: 1950/11/03  Today's Date: 10/14/2019 PT Individual Time: 0803-0900 PT Individual Time Calculation (min): 57 min   Short Term Goals: Week 2:  PT Short Term Goal 1 (Week 2): Pt will maintain sitting balance EOB x 5 minutes with min assist PT Short Term Goal 2 (Week 2): Pt will perform bed<>WC transfers with max assist of 1 intermittiently PT Short Term Goal 3 (Week 2): Pt will initiate WC propulsion PT Short Term Goal 4 (Week 2): Pt will initiate gait training with mechanical lift up to 28f  Skilled Therapeutic Interventions/Progress Updates: Pt presented in bed agreeable to therapy. Pt performed L NMR vis forced use in supine prior to transfer to TIS. Performed AA DF with AROM PF, AA heel slides, AA hip abd/add, AA SLR, QS all performed x 10. PTA threaded pants with pt performing rolling R maxA, L minA to pull pants over hips. Pt then performed supine to sit with maxA for BLE management and truncal support. Once in sitting pt required modA initially to maintain balance then progressed to SBA. PTA placed board under pt and performed SB [place] Pt transported to ortho gym and participated in standing frame x 3 min for wt bearing through LLE. PTA provided multimodal cues for improving posture and manual facilitation for wt shifting to L. Once completed pt returned to room and remained in TIS with belt alarm on, call bell within reach and needs met.      Therapy Documentation Precautions:  Precautions Precautions: Fall Precaution Comments: Sacral decubitus wounds, Lt hemi Restrictions Weight Bearing Restrictions: No General:   Vital Signs: Therapy Vitals Temp: 98.5 F (36.9 C) Temp Source: Oral Pulse Rate: 95 Resp: 20 BP: (!) 89/73 Oxygen Therapy SpO2: 100 % O2 Device: Room Air  Therapy/Group: Individual Therapy  [name]  Kristena Wilhelmi,  PTA  10/14/2019, 4:22 PM

## 2019-10-14 NOTE — Progress Notes (Signed)
Goal reduced d/t hypotension 1.8

## 2019-10-14 NOTE — Progress Notes (Signed)
[place] PHYSICAL MEDICINE & REHABILITATION PROGRESS NOTE   Subjective/Complaints: Appreciate Nephro note,  Pt with mild Left arm pain, feels ready for rehab today   ROS: Denies CP, SOB, N/V/D  Objective:   No results found. Recent Labs    10/11/19 1403 10/13/19 0900 10/13/19 1245  WBC 7.5  --  8.6  HGB 7.4* 8.8* 9.0*  HCT 24.7* 26.0* 30.9*  PLT 321  --  350   Recent Labs    10/11/19 1402 10/13/19 0900 10/13/19 1245  NA 137 135 138  K 3.9 4.0 4.4  CL 103 101 102  CO2 28  --  27  GLUCOSE 97 94 114*  BUN 35* 28* 30*  CREATININE 4.51* 3.80* 4.13*  CALCIUM 9.6  --  10.3    Intake/Output Summary (Last 24 hours) at 10/14/2019 0726 Last data filed at 10/14/2019 0528 Gross per 24 hour  Intake 740 ml  Output 390 ml  Net 350 ml     Physical Exam: Vital Signs Blood pressure 134/84, pulse 91, temperature 98.4 F (36.9 C), resp. rate 18, height 5\' 10"  (1.778 m), weight 81 kg, SpO2 99 %. Constitutional: No distress . Vital signs reviewed. HENT: Normocephalic.  Atraumatic. Eyes: EOMI. No discharge. Cardiovascular: No JVD. Respiratory: Normal effort.  No stridor. GI: Non-distended. Skin: Warm and dry.  Intact. Psych: Normal mood.  Normal behavior. Musc: No edema in extremities.  No tenderness in extremities. Neuro: Alert Dysphonia, unchanged Motor: RUE/RLE: 4/5 proximal distal LUE: Shoulder abduction, elbow flexion/extension 2/5, handgrip 0/5  LLE: Hip flexion, knee extension 3 -/5, ankle dorsiflexion 0/5  Assessment/Plan: 1. Functional deficits secondary to Left hemiparesis from RIght parietal infarct   which require 3+ hours per day of interdisciplinary therapy in a comprehensive inpatient rehab setting.  Physiatrist is providing close team supervision and 24 hour management of active medical problems listed below.  Physiatrist and rehab team continue to assess barriers to discharge/monitor patient progress toward functional and medical goals  Care  Tool:  Bathing    Body parts bathed by patient: Chest, Abdomen, Front perineal area, Right upper leg, Left upper leg, Face   Body parts bathed by helper: Buttocks, Right lower leg, Left lower leg     Bathing assist Assist Level: Maximal Assistance - Patient 24 - 49%     Upper Body Dressing/Undressing Upper body dressing   What is the patient wearing?: Pull over shirt    Upper body assist Assist Level: Maximal Assistance - Patient 25 - 49%    Lower Body Dressing/Undressing Lower body dressing      What is the patient wearing?: Pants, Incontinence brief     Lower body assist Assist for lower body dressing: Total Assistance - Patient < 25%     Toileting Toileting Toileting Activity did not occur (Clothing management and hygiene only): N/A (no void or bm)  Toileting assist Assist for toileting: Total Assistance - Patient < 25% Assistive Device Comment: urinal    Transfers Chair/bed transfer  Transfers assist  Chair/bed transfer activity did not occur: Safety/medical concerns  Chair/bed transfer assist level: 2 Helpers(sliding board)     Locomotion Ambulation   Ambulation assist   Ambulation activity did not occur: Safety/medical concerns          Walk 10 feet activity   Assist  Walk 10 feet activity did not occur: Safety/medical concerns        Walk 50 feet activity   Assist Walk 50 feet with 2 turns activity did not occur:  Safety/medical concerns         Walk 150 feet activity   Assist Walk 150 feet activity did not occur: Safety/medical concerns         Walk 10 feet on uneven surface  activity   Assist Walk 10 feet on uneven surfaces activity did not occur: Safety/medical concerns         Wheelchair     Assist   Type of Wheelchair: Manual    Wheelchair assist level: Dependent - Patient 0%(TIS WC) Max wheelchair distance: 150    Wheelchair 50 feet with 2 turns activity    Assist        Assist Level:  Dependent - Patient 0%   Wheelchair 150 feet activity     Assist      Assist Level: Dependent - Patient 0%   Blood pressure 134/84, pulse 91, temperature 98.4 F (36.9 C), resp. rate 18, height 5\' 10"  (1.778 m), weight 81 kg, SpO2 99 %.  Medical Problem List and Plan: 1.  Debility secondary to acute hypoxemic RIght parietal infarct with Left hemiparesis LE>UE   Continue CIR- PT, OT, SLP, TEam conf in am  2.  Antithrombotics: -DVT/anticoagulation: Subcutaneous heparin. Negative  vascular study             -antiplatelet therapy: Aspirin 81 mg daily 3. Pain Management: Hycet as needed, Right great toe pain,improved cont  prevalon boots    Relatively controlled on 12/20 4. Mood: Provide emotional support. Rozerem 8 mg nightly             -antipsychotic agents: N/A 5. Neuropsych: This patient is capable of making decisions on his own behalf.             SLP recommends additional diagnostics of higher level cognitive skills.  6. Skin/Wound Care: Wound care sacral decubitus with tunneling.   -pack Aquacel Ag+ twice daily for WOC  -woc rn consulted- ask surgery to eval for wide incision and drainage, wound probed per rehab PA to 7cm yesterday vs 4cm last week  -nutrition, turning, prevalon boots for heels- needs order to place qhs  7. Fluids/Electrolytes/Nutrition: Routine in and outs 8.  Anemia/melena.  Continue Aranesp  Hgb 7.4 on 12/19 9.  Tracheostomy 08/22/2019.  Decannulated.  Diet advanced to regular.  Continue nebulizers as directed 10.  MSSA bacteremia/pneumonia.  Antibiotic therapy completed 11.  Hyperlipidemia.  Lipitor 12.  Post stroke oropharyngeal dysphagia: Appreciate speech therapy recommendations; regular diet with oral care BID  -MMW for thrush 13.  Renal failure after sepsis- on HD per Nephro - tent d/c 1/7 will need to make plans for OP HD   First stage left basilic vein transposition yesterday  14.  Leukocytosis: Resolved  WBCs 7.5 on 12/19  Afebrile- does have  serratia marcesens growing from wound S to ceftriaxone,   Continue to monitor 15.  Sleep disturbance  Melatonin started on 12/20  LOS: 11 days A FACE TO Struble E Zairah Arista 10/14/2019, 7:26 AM

## 2019-10-14 NOTE — Progress Notes (Signed)
Occupational Therapy Session Note  Patient Details  Name: [name] MRN: [id number] Date of Birth: 1951/01/05  Today's Date: 10/14/2019 OT Individual Time: 1130-1158 OT Individual Time Calculation (min): 28 min    Short Term Goals: Week 2:  OT Short Term Goal 1 (Week 2): Pt will demonstrate carryover of 1 self ROM exercise with mod vcs OT Short Term Goal 2 (Week 2): Pt will complete BSC transfer with 2 assist for OOB toileting OT Short Term Goal 3 (Week 2): Pt will complete 1 self care task EOB with no more than Mod A for balance  Skilled Therapeutic Interventions/Progress Updates:    Patient supine in bed, alert and states that he is tired but willing to participate.  Wife is present for session today.  Rolling left in bed with min A, side lying to SSP with max A.  He tolerated unsupported sitting for 20 minutes with min/mod A to maintain balance, focus on proximal stability, anterior pelvic tilt, reaching, trunk rotation and awareness of body in space.  Completed left UE AAROM and motor control of proximal musculature in supine position - he is able to extend elbow against gravity and hold arm in neutral position.  Positioned in bed for meal completion.  Wife assisting with set up.    Therapy Documentation Precautions:  Precautions Precautions: Fall Precaution Comments: Sacral decubitus wounds, Lt hemi Restrictions Weight Bearing Restrictions: No General:   Vital Signs: Therapy Vitals Pulse Rate: 92 Resp: 18 Patient Position (if appropriate): Lying Oxygen Therapy SpO2: 100 % O2 Device: Room Air FiO2 (%): 21 % Pain: Pain Assessment Pain Scale: 0-10 Pain Score: 0-No pain Other Treatments:     Therapy/Group: Individual Therapy  Carlos Levering 10/14/2019, 12:02 PM

## 2019-10-14 NOTE — Progress Notes (Signed)
Speech Language Pathology Daily Session Note  Patient Details  Name: [name] MRN: [id number] Date of Birth: 11-14-1950  Today's Date: 10/14/2019 SLP Individual Time: 1050-1130 SLP Individual Time Calculation (min): 40 min  Short Term Goals: Week 2: SLP Short Term Goal 1 (Week 2): Patient will complete higher level reasoning and executive functioning tasks (ex: med and money management) with 90% accuracy and min cues. SLP Short Term Goal 2 (Week 2): Patient will state and demonstrate compensatory cognitive-linguistic strategies as trained with 90% accuracy and min cues. SLP Short Term Goal 3 (Week 2): Patient will complete delayed recall tasks for 5 stimuil post ~5 minute delay with 80% accuracy and min cues.  Skilled Therapeutic Interventions: Skilled ST services focused on cognitive skills. Pt expressed fatigue from back to back treatment session, due to dialysis appointment. SLP facilitated complex problem solving, executive function and working memory strategies in deductive reasoning task, pt required mod A increasing to max A verbal cues, impacting by fatigue in ability to record own notes due to poor positioning (pt dictated while SLP recorded.) Pt was aware of fatigue impacting cognitive performance, stating " I just keep drifting off." SLP recommends congitive task to be completed while seating in chair to allow more independence of note taking skills to aid in executive function/memory, if possible. Pt was left in room with call bell within reach and bed alarm set. ST recommends to continue skilled ST services.      Pain Pain Assessment Pain Scale: 0-10 Pain Score: 0-No pain  Therapy/Group: Individual Therapy  Mea Ozga  [facility] 10/14/2019, 11:40 AM

## 2019-10-14 NOTE — Progress Notes (Signed)
[facility] ROUNDING NOTE   Subjective:   This is a 68 year old male who presented to [facility] hospital 07/26/2019 he had previously been diagnosed with COVID-19 July 13, 2019.  He had increasing oxygen requirements and required with steroids and remdesivir which she completed on 07/30/2019 did require intubation for airway protection.  He was also treated with vancomycin and cefepime through to 08/02/2019.  CT scan of chest was negative for pulmonary emboli.  He was admitted to [place].  His creatinine was elevated at 1.51 renal was consulted for AKI and he needed dialysis 08/07/2019.  He was deemed chronic end-stage renal disease and required dialysis on a TTS schedule.  GI has been following for ongoing blood loss and melena stools.  An endoscopy showed no evidence of active bleeding or ulcers in the stomach.  He has a long history of being intubated and required a tracheostomy 08/22/2019 since then the patient has been extubated.  Hospital course has been complicated by bouts of atrial fibrillation with rapid ventricular rate.  He also had altered mental status neurology was consulted 08/27/2019.  MRI showed small acute to early subacute infarcts bilateral cerebral cortex.  He also developed sacral decubitus and is being managed with wound care and partial-thickness areas of tissue loss at posterior aspects of the penile shaft.  He underwent placement of a basilic vein transposition first stage 10/13/2019.  Appreciate assistance from Dr. Scot Dock.  Last dialysis session 2.1 L removed 10/11/2019  Blood pressure 134/84 pulse 91 temperature 98.4 O2 sats 100% on room air  Sodium 138 potassium 4.4 chloride 102 CO2 27 BUN 30 creatinine 4.12 glucose 114 calcium 10.3 WBC 8.6 hemoglobin 9.0 platelets 250  Aspirin 81 mg daily Lipitor 40 mg daily Pulmicort nebulizer twice daily, darbepoetin 150 mcg q. Saturday, multivitamins 1 daily Protonix 40 mg daily ramelteon 8 mg  nightly   Objective:  Vital signs in last 24 hours:  Temp:  [97.6 F (36.4 C)-98.9 F (37.2 C)] 98.4 F (36.9 C) (12/22 0505) Pulse Rate:  [91-119] 92 (12/22 0855) Resp:  [18] 18 (12/22 0855) BP: (122-134)/(68-84) 134/84 (12/22 0505) SpO2:  [94 %-100 %] 100 % (12/22 0855) FiO2 (%):  [21 %] 21 % (12/22 0855)  Weight change:  Filed Weights   10/07/19 0517 10/07/19 1340 10/07/19 1718  Weight: 87 kg 83 kg 81 kg    Intake/Output: I/O last 3 completed shifts: In: 840 [P.O.:340; I.V.:500] Out: 490 [Urine:475; Blood:15]   Intake/Output this shift:  Total I/O In: 120 [P.O.:120] Out: -   General:NAD, WDWN male, laying in bed Heart:RRR Lungs:CTAB anterolaterally  Abdomen:soft, NTND Extremities:no LE edema Dialysis Access: [facility] X[facility]    Basic Metabolic Panel: Recent Labs  Lab 10/07/19 1400 10/09/19 1508 10/11/19 1402 10/13/19 0900 10/13/19 1245  NA 135 136 137 135 138  K 3.2* 3.4* 3.9 4.0 4.4  CL 97* 99 103 101 102  CO2 27 26 28   --  27  GLUCOSE 139* 90 97 94 114*  BUN 50* 39* 35* 28* 30*  CREATININE 5.64* 4.95* 4.51* 3.80* 4.13*  CALCIUM 9.7 9.7 9.6  --  10.3  PHOS 3.3 2.8 3.8  --   --     Liver Function Tests: Recent Labs  Lab 10/07/19 1400 10/09/19 1508 10/11/19 1402  ALBUMIN 2.2* 2.3* 2.2*   No results for input(s): LIPASE, AMYLASE in the last 168 hours. No results for input(s): AMMONIA in the last 168 hours.  CBC: Recent Labs  Lab 10/07/19 1400 10/09/19 1507  10/11/19 1403 10/13/19 0900 10/13/19 1245  WBC 12.5* 11.5* 7.5  --  8.6  NEUTROABS  --   --  4.8  --   --   HGB 8.2* 7.6* 7.4* 8.8* 9.0*  HCT 27.1* 25.4* 24.7* 26.0* 30.9*  MCV 94.4 93.7 93.9  --  94.8  PLT 339 342 321  --  350    Cardiac Enzymes: No results for input(s): CKTOTAL, CKMB, CKMBINDEX, TROPONINI in the last 168 hours.  BNP: Invalid input(s): POCBNP  CBG: No results for input(s): GLUCAP in the last 168 hours.  Microbiology: Results for orders placed or performed during  the hospital encounter of 10/03/19  Aerobic/Anaerobic Culture (surgical/deep wound)     Status: None   Collection Time: 10/09/19 11:54 AM   Specimen: Wound  Result Value Ref Range Status   Specimen Description WOUND COCCYX  Final   Special Requests NONE  Final   Gram Stain   Final    RARE WBC PRESENT, PREDOMINANTLY PMN NO ORGANISMS SEEN    Culture   Final    RARE SERRATIA MARCESCENS RARE BACTEROIDES FRAGILIS BETA LACTAMASE POSITIVE Performed at [facility], [place] [address] [place], [place] [address]    Report Status 10/13/2019 FINAL  Final   Organism ID, Bacteria SERRATIA MARCESCENS  Final      Susceptibility   Serratia marcescens - MIC*    CEFAZOLIN >=64 RESISTANT Resistant     CEFEPIME <=1 SENSITIVE Sensitive     CEFTAZIDIME <=1 SENSITIVE Sensitive     CEFTRIAXONE <=1 SENSITIVE Sensitive     CIPROFLOXACIN <=0.25 SENSITIVE Sensitive     GENTAMICIN <=1 SENSITIVE Sensitive     TRIMETH/SULFA <=20 SENSITIVE Sensitive     * RARE SERRATIA MARCESCENS  Surgical pcr screen     Status: None   Collection Time: 10/12/19 11:06 AM   Specimen: Nasal Mucosa; Nasal Swab  Result Value Ref Range Status   MRSA, PCR NEGATIVE NEGATIVE Final   Staphylococcus aureus NEGATIVE NEGATIVE Final    Comment: (NOTE) The Xpert SA Assay (FDA approved for NASAL specimens in patients 79 years of age and older), is one component of a comprehensive surveillance program. It is not intended to diagnose infection nor to guide or monitor treatment. Performed at [facility], [place] [address] [place], [place] [address]     Coagulation Studies: Recent Labs    10/13/19 1245  LABPROT 13.8  INR 1.1    Urinalysis: No results for input(s): COLORURINE, LABSPEC, PHURINE, GLUCOSEU, HGBUR, BILIRUBINUR, KETONESUR, PROTEINUR, UROBILINOGEN, NITRITE, LEUKOCYTESUR in the last 72 hours.  Invalid input(s): APPERANCEUR    Imaging: No results found.   Medications:   . cefTRIAXone (ROCEPHIN)  IV 1 g  (10/14/19 1003)   . aspirin EC  81 mg Oral Daily  . atorvastatin  40 mg Oral q1800  . budesonide (PULMICORT) nebulizer solution  0.5 mg Nebulization BID  . chlorhexidine  15 mL Mouth/Throat BID  . Chlorhexidine Gluconate Cloth  6 each Topical BID  . Chlorhexidine Gluconate Cloth  6 each Topical Q0600  . darbepoetin (ARANESP) injection - DIALYSIS  150 mcg Intravenous Q Sat-HD  . feeding supplement (NEPRO CARB STEADY)  237 mL Oral TID BM  . feeding supplement (PRO-STAT SUGAR FREE 64)  30 mL Oral BID  . heparin  5,000 Units Subcutaneous Q8H  . hydrocerin   Topical TID  . magic mouthwash w/lidocaine  5 mL Oral QID  . multivitamin  1 tablet Oral QHS  . pantoprazole  40 mg Oral Q1200  . ramelteon  8 mg Oral QHS  . senna-docusate  2 tablet Oral BID  . sucralfate  1 g Oral Q6H   acetaminophen (TYLENOL) oral liquid 160 mg/5 mL, HYDROcodone-acetaminophen, ipratropium-albuterol, Melatonin, polyethylene glycol, polyvinyl alcohol, sorbitol, white petrolatum  Assessment/ Plan:  1.S/p acute hypoxemia and respiratory failure due to COVID 40:[id number] complications/ secondary infections, prolonged ICU course. R chest tubeout,trach removed 11/30. Transferred to CIR 12/11 2.[place] near 2 month acute admission for COVID/PNA/hypoixc resp failure now on TTSschedule -Admitted 10/3 and started CRRT on 08/07/19, has been on RRT for a little under 2 mos now. outpatient HD set up at [facility]. Foley out - generally requiring added K bath.no heparin due to GIB.  Patient scheduled for dialysis treatment 10/14/2019 3.HTN/volume:BPwell controlled.  4. Anemia:  Continue Aranesp187mcg q Sat.-    5. Secondary hyperparathyroidism:CorrCa slightly high. Not on calcium or vit D, use low Ca bath- limited to 2.25 due to need for 4 K. Phos nowatgoal.iPTHsuppressed at 28 6.KDT:OIZTIWPYKDXIP cortex and right cerebellum small acute to subacuteinfarctswithneuromuscular  weakness/encephalopathy. 7.Nutrition: Alb very low - post COVID anorexia due to lack of taste. Addednepro + pro-stat supplements. Diet liberalized. Encouraged nutrition.  8.Sacral decub: Pressure ulcer - wound care following.Wound culture showed rare serratia marcescens, susceptibility pending. Per [facility] nurse if drainage not "handled" by current tunneled dressing, then will likely need a surgery consult to investigate if an abscess is present.  9. Debility- per CIR 10. Code status DNR 11. Need for permanent access-    left first stage basilic vein transposition 10/13/2019    LOS: [place] @TODAY @10 :58 AM

## 2019-10-14 NOTE — Progress Notes (Signed)
   VASCULAR SURGERY ASSESSMENT & PLAN:   POD 1 - 1st STAGE LEFT AVF: The fistula has an excellent thrill.  He has a palpable left radial pulse.  We have arranged follow-up in 6 weeks with a duplex to check on the maturation of his fistula.  If this matures nicely he will come back subsequently for a second stage basilic vein transposition.  Vascular surgery will be available as needed.  SUBJECTIVE:   No complaints.  PHYSICAL EXAM:   Vitals:   10/13/19 1641 10/13/19 1940 10/13/19 2052 10/14/19 0505  BP: 124/75 122/68  134/84  Pulse: (!) 119 (!) 114  91  Resp: 18 18  18   Temp:  98.9 F (37.2 C)  98.4 F (36.9 C)  TempSrc:      SpO2: 98% 94% 95% 99%  Weight:      Height:       Palpable left radial pulse. Good thrill in left upper arm fistula. Incision looks fine.  LABS:   Lab Results  Component Value Date   WBC 8.6 10/13/2019   HGB 9.0 (L) 10/13/2019   HCT 30.9 (L) 10/13/2019   MCV 94.8 10/13/2019   PLT 350 10/13/2019   Lab Results  Component Value Date   CREATININE 4.13 (H) 10/13/2019   Lab Results  Component Value Date   INR 1.1 10/13/2019   CBG (last 3)  No results for input(s): GLUCAP in the last 72 hours.  PROBLEM LIST:    Principal Problem:   Debility Active Problems:   Pneumonia due to COVID-19 virus   Acute respiratory failure due to COVID-19 ([place])   AKI (acute kidney injury) ([place])   Oropharyngeal dysphagia   Dependent on hemodialysis (HCC)   Dysphagia, post-stroke   Pressure injury of sacral region, unstageable (HCC)   Sleep disturbance   Anemia of chronic disease   CURRENT MEDS:   . aspirin EC  81 mg Oral Daily  . atorvastatin  40 mg Oral q1800  . budesonide (PULMICORT) nebulizer solution  0.5 mg Nebulization BID  . chlorhexidine  15 mL Mouth/Throat BID  . Chlorhexidine Gluconate Cloth  6 each Topical BID  . Chlorhexidine Gluconate Cloth  6 each Topical Q0600  . darbepoetin (ARANESP) injection - DIALYSIS  150 mcg Intravenous Q Sat-HD   . feeding supplement (NEPRO CARB STEADY)  237 mL Oral TID BM  . feeding supplement (PRO-STAT SUGAR FREE 64)  30 mL Oral BID  . heparin  5,000 Units Subcutaneous Q8H  . hydrocerin   Topical TID  . magic mouthwash w/lidocaine  5 mL Oral QID  . multivitamin  1 tablet Oral QHS  . pantoprazole  40 mg Oral Q1200  . ramelteon  8 mg Oral QHS  . senna-docusate  2 tablet Oral BID  . sucralfate  1 g Oral Q6H    [name] Office: [phone] 10/14/2019

## 2019-10-14 NOTE — Consult Note (Signed)
[name] Mar 11, 1951  [id number].    Requesting MD: Dr. Alysia Penna  Chief Complaint/Reason for Consult: Sacral wound  HPI: [name] is a 68 y.o. male with a history of COVID (9/20) s/p treatment at [facility] that was complicated by long-term intubation requiring tracheostomy tube 10/30 that has since been deccanulated, atrial fibrillation, CVA, renal failure on HD and a sacral decubitus. He was admitted to inpatient rehab on 12/11.  He has been undergoing care for his sacral wound by WOCN. Per notes the sacral wound measures 1.3cm x 0.7cm x 1cm and has tunneling towards the 12 o'clock position, currently being treated with packing with strips of silver hydrofiber, Aquacel BID. The wound has noted to have increased drainage over the last several days. Patient reports dressing changes every other day. Some pain with dressing changes. He is afebrile. WBC 8.6. Wound cultures from 12/17 grew serratia marcescens that is being tx with Rocephin. No imaging on file. General surgery was asked to see.    ROS: Review of Systems  Constitutional: Negative for chills and fever.  Respiratory: Positive for cough. Negative for shortness of breath.   Cardiovascular: Negative for chest pain.  Gastrointestinal: Negative for abdominal pain, constipation, diarrhea, nausea and vomiting.  Genitourinary: Negative for dysuria.  Skin:       Sacral wound  All other systems reviewed and are negative.   Family History  Problem Relation Age of Onset  . Colon cancer Neg Hx   . Colon polyps Neg Hx     Past Medical History:  Diagnosis Date  . COVID-19   . Heartburn     Past Surgical History:  Procedure Laterality Date  . ACHILLES TENDON SURGERY Right 2012  . [place] TRANSPOSITION  10/13/2019   Procedure: Basilic Vein Transposition Left Arm;  Surgeon: Angelia Mould, MD;  Location: [facility] OR;  Service: Vascular;;  . COLONOSCOPY  2009   [facility]  . ESOPHAGOGASTRODUODENOSCOPY N/A 12/25/2014   Procedure: ESOPHAGOGASTRODUODENOSCOPY (EGD);  Surgeon: Danie Binder, MD;  Location: [facility];  Service: Endoscopy;  Laterality: N/A;  830am  . ESOPHAGOGASTRODUODENOSCOPY N/A 08/09/2019   Procedure: ESOPHAGOGASTRODUODENOSCOPY (EGD);  Surgeon: Ronald Lobo, MD;  Location: [facility];  Service: Endoscopy;  Laterality: N/A;  Patient is already sedated, so I do not anticipate need for further sedation.  Okay from my standpoint to do either at the bedside or in the operating room  . HEMOSTASIS CLIP PLACEMENT  08/09/2019   Procedure: HEMOSTASIS CLIP PLACEMENT;  Surgeon: Ronald Lobo, MD;  Location: [facility] ENDOSCOPY;  Service: Endoscopy;;  . HEMOSTASIS CONTROL  08/09/2019   Procedure: HEMOSTASIS CONTROL;  Surgeon: Ronald Lobo, MD;  Location: [facility] ENDOSCOPY;  Service: Endoscopy;;  epi   . IR FLUORO GUIDE CV LINE LEFT  09/02/2019  . IR US GUIDE VASC ACCESS LEFT  09/02/2019  . KNEE SURGERY Left 1980's  . none    . WRIST SURGERY Left 1970's    Social History:  reports that he has never smoked. He has never used smokeless tobacco. He reports current alcohol use of about 2.0 standard drinks of alcohol per week. He reports that he does not use drugs.  Allergies: No Known Allergies  Medications Prior to Admission  Medication Sig Dispense Refill  . acetaminophen (TYLENOL) 160 MG/5ML solution Place 20.3 mLs (650 mg total) into feeding tube every 4 (four) hours as needed for fever. 120 mL 0  . Amino Acids-Protein Hydrolys (FEEDING SUPPLEMENT, PRO-STAT SUGAR FREE 64,) LIQD Take 30 mLs  by mouth 2 (two) times daily. 887 mL 0  . aspirin EC 81 MG EC tablet Take 1 tablet (81 mg total) by mouth daily.    Marland Kitchen atorvastatin (LIPITOR) 40 MG tablet Take 1 tablet (40 mg total) by mouth daily at 6 PM.    . budesonide (PULMICORT) 0.5 MG/2ML nebulizer solution Take 2 mLs (0.5 mg total) by nebulization 2 (two) times daily. 120 mL 12  . chlorhexidine (PERIDEX) 0.12 % solution Use as directed 15 mLs in the mouth or  throat 2 (two) times daily. 120 mL 0  . Chlorhexidine Gluconate Cloth 2 % PADS Apply 6 each topically daily at 6 (six) AM.    . Darbepoetin Alfa (ARANESP) 150 MCG/0.3ML SOSY injection Inject 0.3 mLs (150 mcg total) into the vein every Saturday with hemodialysis. 1.68 mL   . guaiFENesin-dextromethorphan (ROBITUSSIN DM) 100-10 MG/5ML syrup Place 5 mLs into feeding tube every 4 (four) hours as needed for cough. 118 mL 0  . heparin 5000 UNIT/ML injection Inject 1 mL (5,000 Units total) into the skin every 8 (eight) hours. 1 mL   . ipratropium-albuterol (DUONEB) 0.5-2.5 (3) MG/3ML SOLN Take 3 mLs by nebulization every 4 (four) hours as needed. 360 mL   . Maltodextrin-Xanthan Gum (RESOURCE THICKENUP CLEAR) POWD Take 120 g by mouth as needed.    . multivitamin (RENA-VIT) TABS tablet Take 1 tablet by mouth at bedtime.  0  . Nutritional Supplements (FEEDING SUPPLEMENT, NEPRO CARB STEADY,) LIQD Take 237 mLs by mouth 3 (three) times daily between meals.  0  . ondansetron (ZOFRAN) 4 MG/2ML SOLN injection Inject 2 mLs (4 mg total) into the vein every 6 (six) hours as needed for nausea or vomiting. 2 mL 0  . pantoprazole (PROTONIX) 40 MG tablet Take 1 tablet (40 mg total) by mouth daily at 12 noon.    . phenol (CHLORASEPTIC) 1.4 % LIQD Use as directed 1 spray in the mouth or throat as needed for throat irritation / pain.  0  . polyethylene glycol (MIRALAX / GLYCOLAX) 17 g packet Place 17 g into feeding tube daily as needed (consitpation). 14 each 0  . polyvinyl alcohol (LIQUIFILM TEARS) 1.4 % ophthalmic solution Place 1 drop into both eyes as needed for dry eyes. 15 mL 0  . ramelteon (ROZEREM) 8 MG tablet Take 1 tablet (8 mg total) by mouth at bedtime.    . sucralfate (CARAFATE) 1 GM/10ML suspension Take 10 mLs (1 g total) by mouth every 6 (six) hours. 420 mL 0  . traZODone (DESYREL) 50 MG tablet Take 0.5 tablets (25 mg total) by mouth 3 times/day as needed-between meals & bedtime for sleep (If no response to  ramelteon.).    Marland Kitchen white petrolatum (VASELINE) OINT Apply 1 application topically as needed for lip care.  0     Physical Exam: Blood pressure 134/84, pulse 91, temperature 98.4 F (36.9 C), resp. rate 18, height 5\' 10"  (1.778 m), weight 81 kg, SpO2 99 %. General: pleasant, WD, male who is laying in bed in NAD. Just transferred to bed by OT from wheelchair HEENT: head is normocephalic, atraumatic.  Sclera are noninjected.  PERRL.  Ears and nose without any masses or lesions.  Mouth is pink and moist Heart: regular, rate, and rhythm.  Normal s1,s2. No obvious murmurs, gallops, or rubs noted.  Palpable radial and pedal pulses bilaterally Lungs: CTAB, no wheezes, rhonchi, or rales noted.  Respiratory effort nonlabored Abd: Soft, NT, ND, +BS, no masses, hernias, or organomegaly GU: Sacral  wound measuring 2x1x3cm with tracking towards head 8cm, 6cm towards right foot, 5cm towards left foot. There is milky, purulent like drainage. No surrounding erythema, heat, induration.  MS: No LE edema Skin: warm and dry with no masses, lesions, or rashes Psych: A&Ox3 with an appropriate affect.     Results for orders placed or performed during the hospital encounter of 10/03/19 (from the past 48 hour(s))  Surgical pcr screen     Status: None   Collection Time: 10/12/19 11:06 AM   Specimen: Nasal Mucosa; Nasal Swab  Result Value Ref Range   MRSA, PCR NEGATIVE NEGATIVE   Staphylococcus aureus NEGATIVE NEGATIVE    Comment: (NOTE) The Xpert SA Assay (FDA approved for NASAL specimens in patients 29 years of age and older), is one component of a comprehensive surveillance program. It is not intended to diagnose infection nor to guide or monitor treatment. Performed at [facility], [place] [address], [place], [place] [address]   I-STAT, [place] 8     Status: Abnormal   Collection Time: 10/13/19  9:00 AM  Result Value Ref Range   Sodium 135 135 - 145 mmol/L   Potassium 4.0 3.5 - 5.1 mmol/L   Chloride  101 98 - 111 mmol/L   BUN 28 (H) 8 - 23 mg/dL   Creatinine, Ser 3.80 (H) 0.61 - 1.24 mg/dL   Glucose, Bld 94 70 - 99 mg/dL   Calcium, Ion 1.23 1.15 - 1.40 mmol/L   TCO2 27 22 - 32 mmol/L   Hemoglobin 8.8 (L) 13.0 - 17.0 g/dL   HCT 26.0 (L) 39.0 - 76.1 %  Basic metabolic panel     Status: Abnormal   Collection Time: 10/13/19 12:45 PM  Result Value Ref Range   Sodium 138 135 - 145 mmol/L   Potassium 4.4 3.5 - 5.1 mmol/L   Chloride 102 98 - 111 mmol/L   CO2 27 22 - 32 mmol/L   Glucose, Bld 114 (H) 70 - 99 mg/dL   BUN 30 (H) 8 - 23 mg/dL   Creatinine, Ser 4.13 (H) 0.61 - 1.24 mg/dL   Calcium 10.3 8.9 - 10.3 mg/dL   GFR calc non Af Amer 14 (L) >60 mL/min   GFR calc Af Amer 16 (L) >60 mL/min   Anion gap 9 5 - 15    Comment: Performed at [facility], [place] [address] [place], [place] [address]  Protime-INR     Status: None   Collection Time: 10/13/19 12:45 PM  Result Value Ref Range   Prothrombin Time 13.8 11.4 - 15.2 seconds   INR 1.1 0.8 - 1.2    Comment: (NOTE) INR goal varies based on device and disease states. Performed at [facility], [place] [address] [place], [place] [address]   CBC     Status: Abnormal   Collection Time: 10/13/19 12:45 PM  Result Value Ref Range   WBC 8.6 4.0 - 10.5 K/uL   RBC 3.26 (L) 4.22 - 5.81 MIL/uL   Hemoglobin 9.0 (L) 13.0 - 17.0 g/dL   HCT 30.9 (L) 39.0 - 52.0 %   MCV 94.8 80.0 - 100.0 fL   MCH 27.6 26.0 - 34.0 pg   MCHC 29.1 (L) 30.0 - 36.0 g/dL   RDW 16.0 (H) 11.5 - 15.5 %   Platelets 350 150 - 400 K/uL   nRBC 0.0 0.0 - 0.2 %    Comment: Performed at [place] [address] [place], [place]  [address]   No results found.  Anti-infectives (From admission, onward)   Start     Dose/Rate Route Frequency Ordered Stop   10/14/19 0730  cefTRIAXone (ROCEPHIN) 1 g in sodium chloride 0.9 % 100 mL IVPB     1 g 200 mL/hr over 30 Minutes Intravenous Every 24 hours 10/14/19 0726     10/13/19 0600  cefUROXime (ZINACEF) 1.5 g  in sodium chloride 0.9 % 100 mL IVPB     1.5 g 200 mL/hr over 30 Minutes Intravenous On call to O.R. 10/12/19 0956 10/14/19 0559       Assessment/Plan Hx COVID (9/20)  Tracheostomy tube 10/30 s/p decannulation Atrial fibrillation CVA ARF on HD   Sacral decubitus - No current indication for surgical debridement.  - No surrounding cellulitis. Patient does not appear toxic from this.,  - Recommend changing to TID dressing changes with iodoform packing strips - If not cleaning up well, may need to consider surgical debridement in the future - We will continue to follow   FEN - Reg VTE - SCDs, ASA, subcutaneous heparin  ID - Rocephin per primary team, wound cx w/ serratia marcescens. Afebrile, wbc 8.6  Jillyn Ledger, [place] 10/14/2019, 9:34 AM Please see Amion for pager number during day hours 7:00am-4:30pm

## 2019-10-15 ENCOUNTER — Inpatient Hospital Stay (HOSPITAL_COMMUNITY): Payer: Medicare HMO | Admitting: Speech Pathology

## 2019-10-15 ENCOUNTER — Encounter (HOSPITAL_COMMUNITY): Payer: Medicare HMO | Admitting: Psychology

## 2019-10-15 ENCOUNTER — Inpatient Hospital Stay (HOSPITAL_COMMUNITY): Payer: Medicare HMO | Admitting: Physical Therapy

## 2019-10-15 ENCOUNTER — Inpatient Hospital Stay (HOSPITAL_COMMUNITY): Payer: Medicare HMO

## 2019-10-15 DIAGNOSIS — I639 Cerebral infarction, unspecified: Secondary | ICD-10-CM

## 2019-10-15 DIAGNOSIS — I69391 Dysphagia following cerebral infarction: Secondary | ICD-10-CM

## 2019-10-15 LAB — BASIC METABOLIC PANEL
Anion gap: 11 (ref 5–15)
BUN: 21 mg/dL (ref 8–23)
CO2: 28 mmol/L (ref 22–32)
Calcium: 9.7 mg/dL (ref 8.9–10.3)
Chloride: 100 mmol/L (ref 98–111)
Creatinine, Ser: 3.35 mg/dL — ABNORMAL HIGH (ref 0.61–1.24)
GFR calc Af Amer: 21 mL/min — ABNORMAL LOW (ref >60)
GFR calc non Af Amer: 18 mL/min — ABNORMAL LOW (ref >60)
Glucose, Bld: 97 mg/dL (ref 70–99)
Potassium: 3.9 mmol/L (ref 3.5–5.1)
Sodium: 139 mmol/L (ref 135–145)

## 2019-10-15 LAB — IRON AND TIBC
Iron: 30 ug/dL — ABNORMAL LOW (ref 45–182)
Saturation Ratios: 19 % (ref 17.9–39.5)
TIBC: 158 ug/dL — ABNORMAL LOW (ref 250–450)
UIBC: 128 ug/dL

## 2019-10-15 NOTE — Progress Notes (Signed)
[facility] Progress Note  2 Days Post-Op  Subjective: CC-  No complaints. About the work with therapy. Tolerating dressing changes well.  Objective: Vital signs in last 24 hours: Temp:  [98 F (36.7 C)-98.5 F (36.9 C)] 98 F (36.7 C) (12/23 0511) Pulse Rate:  [82-108] 94 (12/23 0511) Resp:  [18-22] 18 (12/23 0511) BP: (80-146)/(52-88) 132/80 (12/23 0511) SpO2:  [99 %-100 %] 99 % (12/23 0511) FiO2 (%):  [21 %] 21 % (12/22 0855) Last BM Date: 10/13/19  Intake/Output from previous day: 12/22 0701 - 12/23 0700 In: 220 [P.O.:120; IV Piggyback:100] Out: 1500 [Urine:200] Intake/Output this shift: No intake/output data recorded.  PE: Gen:  Alert, NAD, pleasant HEENT: EOM's intact, pupils equal and round Pulm: rate and effort normal Abd: Soft, NT/ND Psych: A&Ox3  Msk: no BLE edema Skin: warm and dry GU: Sacral wound measuring 2x1x3cm with significant tunneling, open and draining, purulent drainage appears less, no surrounding erythema or induration   Lab Results:  Recent Labs    10/13/19 1245 10/14/19 1809  WBC 8.6 9.9  HGB 9.0* 8.3*  HCT 30.9* 27.7*  PLT 350 296   BMET Recent Labs    10/13/19 1245 10/14/19 1809  NA 138 135  K 4.4 3.5  CL 102 97*  CO2 27 28  GLUCOSE 114* 105*  BUN 30* 11  CREATININE 4.13* 2.22*  CALCIUM 10.3 8.6*   PT/INR Recent Labs    10/13/19 1245  LABPROT 13.8  INR 1.1   CMP     Component Value Date/Time   NA 135 10/14/2019 1809   NA 143 11/08/2017 1030   K 3.5 10/14/2019 1809   CL 97 (L) 10/14/2019 1809   CO2 28 10/14/2019 1809   GLUCOSE 105 (H) 10/14/2019 1809   BUN 11 10/14/2019 1809   BUN 8 11/08/2017 1030   CREATININE 2.22 (H) 10/14/2019 1809   CREATININE 1.15 11/20/2014 1224   CALCIUM 8.6 (L) 10/14/2019 1809   CALCIUM 9.2 10/07/2019 1422   PROT 6.6 09/10/2019 0431   PROT 7.2 11/08/2017 1030   ALBUMIN 2.6 (L) 10/14/2019 1809   ALBUMIN 4.6 11/08/2017 1030   AST 35 09/10/2019 0431   ALT 6  09/10/2019 0431   ALKPHOS 73 09/10/2019 0431   BILITOT 0.4 09/10/2019 0431   BILITOT 0.4 11/08/2017 1030   GFRNONAA 30 (L) 10/14/2019 1809   GFRAA 34 (L) 10/14/2019 1809   Lipase  No results found for: LIPASE     Studies/Results: No results found.  Anti-infectives: Anti-infectives (From admission, onward)   Start     Dose/Rate Route Frequency Ordered Stop   10/15/19 0200  metroNIDAZOLE (FLAGYL) tablet 500 mg     500 mg Oral Every 8 hours 10/14/19 1953     10/14/19 1400  metroNIDAZOLE (FLAGYL) tablet 500 mg  Status:  Discontinued     500 mg Oral Every 8 hours 10/14/19 1121 10/14/19 1953   10/14/19 0730  cefTRIAXone (ROCEPHIN) 1 g in sodium chloride 0.9 % 100 mL IVPB     1 g 200 mL/hr over 30 Minutes Intravenous Every 24 hours 10/14/19 0726     10/13/19 0600  cefUROXime (ZINACEF) 1.5 g in sodium chloride 0.9 % 100 mL IVPB     1.5 g 200 mL/hr over 30 Minutes Intravenous On call to O.R. 10/12/19 0956 10/14/19 0559       Assessment/Plan Hx COVID (9/20)  Tracheostomy tube 10/30 s/p decannulation Atrial fibrillation CVA ARF on HD   Sacral decubitus - No current  indication for surgical debridement.  - Wound is open and draining, purulence appears less. Continue TID dressing changes with packing strips. Ok to shower with wound open. Likely will clean up well and not require surgical debridement. If drainage is less ok to decrease dressing changes to BID on Friday.  FEN - Reg VTE - SCDs, ASA, subcutaneous heparin  ID - Rocephin/flagyl per primary team, wound cx w/ serratia marcescens. Afebrile, wbc 9.9 (12/22)   LOS: 12 days    Wellington Hampshire, [facility] 10/15/2019, 8:47 AM Please see Amion for pager number during day hours 7:00am-4:30pm

## 2019-10-15 NOTE — Progress Notes (Signed)
[facility] PHYSICAL MEDICINE & REHABILITATION PROGRESS NOTE   Subjective/Complaints: Appreciate surgery consult for wound   ROS: Denies CP, SOB, N/V/D  Objective:   No results found. Recent Labs    10/13/19 1245 10/14/19 1809  WBC 8.6 9.9  HGB 9.0* 8.3*  HCT 30.9* 27.7*  PLT 350 296   Recent Labs    10/13/19 1245 10/14/19 1809  NA 138 135  K 4.4 3.5  CL 102 97*  CO2 27 28  GLUCOSE 114* 105*  BUN 30* 11  CREATININE 4.13* 2.22*  CALCIUM 10.3 8.6*    Intake/Output Summary (Last 24 hours) at 10/15/2019 0800 Last data filed at 10/15/2019 0517 Gross per 24 hour  Intake 220 ml  Output 1500 ml  Net -1280 ml     Physical Exam: Vital Signs Blood pressure 132/80, pulse 94, temperature 98 F (36.7 C), resp. rate 18, height '5\' 10"'$  (1.778 m), weight 81 kg, SpO2 99 %. Constitutional: No distress . Vital signs reviewed. HENT: Normocephalic.  Atraumatic. Eyes: EOMI. No discharge. Cardiovascular: No JVD. Respiratory: Normal effort.  No stridor. GI: Non-distended. Skin: Warm and dry.  Intact. Psych: Normal mood.  Normal behavior. Musc: No edema in extremities.  No tenderness in extremities. Neuro: Alert Dysphonia, unchanged Motor: RUE/RLE: 4/5 proximal distal LUE: Shoulder abduction, elbow flexion/extension 2/5, handgrip 0/5  LLE: Hip flexion, knee extension 3 -/5, ankle dorsiflexion 0/5  Assessment/Plan: 1. Functional deficits secondary to Left hemiparesis from RIght parietal infarct   which require 3+ hours per day of interdisciplinary therapy in a comprehensive inpatient rehab setting.  Physiatrist is providing close team supervision and 24 hour management of active medical problems listed below.  Physiatrist and rehab team continue to assess barriers to discharge/monitor patient progress toward functional and medical goals  Care Tool:  Bathing    Body parts bathed by patient: Chest, Abdomen, Front perineal area, Right upper leg, Left upper leg, Face   Body  parts bathed by helper: Buttocks, Right lower leg, Left lower leg     Bathing assist Assist Level: Maximal Assistance - Patient 24 - 49%     Upper Body Dressing/Undressing Upper body dressing   What is the patient wearing?: Pull over shirt    Upper body assist Assist Level: Maximal Assistance - Patient 25 - 49%    Lower Body Dressing/Undressing Lower body dressing      What is the patient wearing?: Pants, Incontinence brief     Lower body assist Assist for lower body dressing: Total Assistance - Patient < 25%     Toileting Toileting Toileting Activity did not occur (Clothing management and hygiene only): N/A (no void or bm)  Toileting assist Assist for toileting: Total Assistance - Patient < 25% Assistive Device Comment: urinal    Transfers Chair/bed transfer  Transfers assist  Chair/bed transfer activity did not occur: Safety/medical concerns  Chair/bed transfer assist level: 2 Helpers(sliding board)     Locomotion Ambulation   Ambulation assist   Ambulation activity did not occur: Safety/medical concerns          Walk 10 feet activity   Assist  Walk 10 feet activity did not occur: Safety/medical concerns        Walk 50 feet activity   Assist Walk 50 feet with 2 turns activity did not occur: Safety/medical concerns         Walk 150 feet activity   Assist Walk 150 feet activity did not occur: Safety/medical concerns  Walk 10 feet on uneven surface  activity   Assist Walk 10 feet on uneven surfaces activity did not occur: Safety/medical concerns         Wheelchair     Assist   Type of Wheelchair: Manual    Wheelchair assist level: Dependent - Patient 0%(TIS WC) Max wheelchair distance: 150    Wheelchair 50 feet with 2 turns activity    Assist        Assist Level: Dependent - Patient 0%   Wheelchair 150 feet activity     Assist      Assist Level: Dependent - Patient 0%   Blood pressure  132/80, pulse 94, temperature 98 F (36.7 C), resp. rate 18, height '5\' 10"'$  (1.778 m), weight 81 kg, SpO2 99 %.  Medical Problem List and Plan: 1.  Debility secondary to acute hypoxemic RIght parietal infarct with Left hemiparesis LE>UE   Continue CIR- PT, OT, SLP, Team conference today please see physician documentation under team conference tab, met with team face-to-face to discuss problems,progress, and goals. Formulized individual treatment plan based on medical history, underlying problem and comorbidities. 2.  Antithrombotics: -DVT/anticoagulation: Subcutaneous heparin. Negative  vascular study             -antiplatelet therapy: Aspirin 81 mg daily 3. Pain Management: Hycet as needed, Right great toe pain,improved cont  prevalon boots    Relatively controlled on 12/22 4. Mood: Provide emotional support. Rozerem 8 mg nightly             -antipsychotic agents: N/A 5. Neuropsych: This patient is capable of making decisions on his own behalf.             SLP recommends additional diagnostics of higher level cognitive skills.  6. Skin/Wound Care: Wound care sacral decubitus with tunneling.   -pack Aquacel Ag+ twice daily for WOC  -woc rn consulted- apreciate surgery input, no I and D recommendeddiscussed with Pharmacy, expanding antibiotic coverage to include bacteroides fragilis  -nutrition, turning, prevalon boots for heels- needs order to place qhs  7. Fluids/Electrolytes/Nutrition: Routine in and outs 8.  Anemia/melena.  Continue Aranesp  Hgb 7.4 on 12/19 9.  Tracheostomy 08/22/2019.  Decannulated.  Diet advanced to regular.  Continue nebulizers as directed 10.  MSSA bacteremia/pneumonia.  Antibiotic therapy completed 11.  Hyperlipidemia.  Lipitor 12.  Post stroke oropharyngeal dysphagia: Appreciate speech therapy recommendations; regular diet with oral care BID  -MMW for thrush 13.  Renal failure after sepsis- on HD per Nephro - tent d/c 1/7 will need to make plans for OP HD- creat  improving   First stage left basilic vein transposition 12/21 14.  Leukocytosis: Resolved  WBCs 7.5 on 12/19  Afebrile- does have serratia marcesens growing from wound S to ceftriaxone, Bacteroides growing out as well but no susceptibility reported as rec per Pharm, adding metronidazole for anaerobic coverage   Continue to monitor 15.  Sleep disturbance  Melatonin started on 12/20  LOS: 12 days A FACE TO Shokan [name] 10/15/2019, 8:00 AM

## 2019-10-15 NOTE — Progress Notes (Signed)
Social Work Patient ID: [name], male   DOB: 10-29-1950, 68 y.o.   MRN: [id number]  Met with pt and spoke with wife via telephone to disscuss team conference progress, missed therapies and need to extend date to 1/15. Both are agreeable to this and feels he needs as long as he can to make progress and get stronger due to long hospitalization and how deconditioned he is. Wife got to observe yesterday which was helpful for her. Will continue to work on discharge needs. Neuro-psych saw today for coping which pt felt was helpful.

## 2019-10-15 NOTE — Progress Notes (Addendum)
[facility] Progress Note   Dialysis Orders: arranged for [facility]  Assessment/Plan: 1.S/p acute hypoxemia and respiratory failure due to COVID 36:RWERXVQM complications/ secondary infections, prolonged ICU course. R chest tubeout,trach removed 11/30. Transferred to CIR 12/11 2.[place] 2 month acute admission for COVID/PNA/hypoixc resp failure now on TTSschedule -Admitted 10/3 and started CRRT on 08/07/19, has been on RRT for a little under 2 mos now. Cr 2.22 pre HD 12/22 significant improvement - repeat today and daily - may be able to hold HD.Outpatient HD set up at [facility]. Foley out - generally requiring added K bath.no heparin due to GIB.  Plan repeat labs in am to decide on HD for Thursday or just hold and follow  3.HTN/volume:BPwell controlled. Net UF 1.3 Tuesday; not sure intake is accurately recorded- may want to add diuretic - follow I/O today though 4. Anemia: hgb 8.3  Continue Aranesp148mcg q Sat.-   tsat 17% 11/29 - repeat Fe studies today 5. Secondary hyperparathyroidism:CorrCa slightly high. Not on calcium or vit D, use low Ca bath- limited to 2.25 due to need for 4 K. Phos nowatgoal.iPTHsuppressed at 28 6.GQQ:PYPPJKDTOIZTI cortex and right cerebellum small acute to subacuteinfarctswithneuromuscular weakness/encephalopathy. 7.Nutrition: Alb very low - post COVID anorexia due to lack of taste. Addednepro + pro-stat supplements. Diet liberalized. Encouraged nutrition. 8.Sacral decub: Pressure ulcer - wound care following.Wound culture showed rare serratia marcescens, susceptibility pending. Per [facility] nurse if drainage not "handled" by current tunneled dressing, then will likely need a surgery consult to investigate if an abscess is present.  9. Debility- per CIR  12:46 ADDENDUM:  On further evaluation - Cr is down because it was drawn POST HD yesterday.  It was not drawn pre HD.  Cr is 3.35 today He still  needs dialysis. I have discussed this with him and he is disappointed but understands - also encouraged to saw ALL urine. Next HD tomorrow   Myriam Jacobson, PA-C [place] [phone] 10/15/2019,10:35 AM  LOS: 12 days   Subjective:   Planning on buying a [facility] after discharge. Pleased about improvement in Cr  Objective Vitals:   10/14/19 1710 10/14/19 1758 10/14/19 1956 10/15/19 0511  BP: 121/68 114/64 114/68 132/80  Pulse: (!) 102 (!) 101 (!) 102 94  Resp: 20 18 18 18   Temp: 98 F (36.7 C)  98.1 F (36.7 C) 98 F (36.7 C)  TempSrc: Oral     SpO2:  100% 100% 99%  Height:    5\' 10"  (1.778 m)   Physical Exam General: sitting up in WC - spirits good Heart: RRR Lungs: no rales Abdomen: soft NT Extremities: no LE edema Dialysis Access: [facility]   Additional Objective Labs:  Intake/Output Summary (Last 24 hours) at 10/15/2019 1040 Last data filed at 10/15/2019 1000 Gross per 24 hour  Intake 100 ml  Output 1700 ml  Net -1600 ml   Basic Metabolic Panel: Recent Labs  Lab 10/09/19 1508 10/11/19 1402 10/13/19 0900 10/13/19 1245 10/14/19 1809  NA 136 137 135 138 135  K 3.4* 3.9 4.0 4.4 3.5  CL 99 103 101 102 97*  CO2 26 28  --  27 28  GLUCOSE 90 97 94 114* 105*  BUN 39* 35* 28* 30* 11  CREATININE 4.95* 4.51* 3.80* 4.13* 2.22*  CALCIUM 9.7 9.6  --  10.3 8.6*  PHOS 2.8 3.8  --   --  1.5*   Liver Function Tests: Recent Labs  Lab 10/09/19 1508 10/11/19  1402 10/14/19 1809  ALBUMIN 2.3* 2.2* 2.6*   No results for input(s): LIPASE, AMYLASE in the last 168 hours. CBC: Recent Labs  Lab 10/09/19 1507 10/11/19 1403 10/13/19 0900 10/13/19 1245 10/14/19 1809  WBC 11.5* 7.5  --  8.6 9.9  NEUTROABS  --  4.8  --   --   --   HGB 7.6* 7.4* 8.8* 9.0* 8.3*  HCT 25.4* 24.7* 26.0* 30.9* 27.7*  MCV 93.7 93.9  --  94.8 93.3  PLT 342 321  --  350 296   Blood Culture    Component Value Date/Time   SDES WOUND COCCYX 10/09/2019 1154    SPECREQUEST NONE 10/09/2019 1154   CULT  10/09/2019 1154    RARE SERRATIA MARCESCENS RARE BACTEROIDES FRAGILIS BETA LACTAMASE POSITIVE Performed at [facility] [address] [place], [place] [address]    REPTSTATUS 10/13/2019 FINAL 10/09/2019 1154    Cardiac Enzymes: No results for input(s): CKTOTAL, CKMB, CKMBINDEX, TROPONINI in the last 168 hours. CBG: No results for input(s): GLUCAP in the last 168 hours. Iron Studies: No results for input(s): IRON, TIBC, TRANSFERRIN, FERRITIN in the last 72 hours. Lab Results  Component Value Date   INR 1.1 10/13/2019   INR 1.2 09/02/2019   INR 1.0 08/22/2019   Studies/Results: No results found. Medications: . sodium chloride    . sodium chloride    . cefTRIAXone (ROCEPHIN)  IV Stopped (10/15/19 0845)   . aspirin EC  81 mg Oral Daily  . atorvastatin  40 mg Oral q1800  . budesonide (PULMICORT) nebulizer solution  0.5 mg Nebulization BID  . chlorhexidine  15 mL Mouth/Throat BID  . Chlorhexidine Gluconate Cloth  6 each Topical BID  . Chlorhexidine Gluconate Cloth  6 each Topical Q0600  . darbepoetin (ARANESP) injection - DIALYSIS  150 mcg Intravenous Q Sat-HD  . diclofenac Sodium  2 g Topical QID  . feeding supplement (NEPRO CARB STEADY)  237 mL Oral TID BM  . feeding supplement (PRO-STAT SUGAR FREE 64)  30 mL Oral BID  . heparin  5,000 Units Subcutaneous Q8H  . hydrocerin   Topical TID  . magic mouthwash w/lidocaine  5 mL Oral QID  . metroNIDAZOLE  500 mg Oral Q8H  . multivitamin  1 tablet Oral QHS  . pantoprazole  40 mg Oral Q1200  . ramelteon  8 mg Oral QHS  . senna-docusate  2 tablet Oral BID  . sucralfate  1 g Oral Q6H

## 2019-10-15 NOTE — Progress Notes (Signed)
Occupational Therapy Session Note  Patient Details  Name: [name] MRN: [id number] Date of Birth: [birth year]  Today's Date: 10/15/2019 OT Individual Time: 0915-1000 OT Individual Time Calculation (min): 45 min    Short Term Goals: Week 1:  OT Short Term Goal 1 (Week 1): Pt will complete toilet or BSC transfer with 2 assist for OOB toileting OT Short Term Goal 1 - Progress (Week 1): Progressing toward goal OT Short Term Goal 2 (Week 1): Pt will complete 1 grooming task w/c level at sink with mod vcs to include Lt limb OT Short Term Goal 2 - Progress (Week 1): Met OT Short Term Goal 3 (Week 1): Pt will don overhead shirt and initiate threading L UE first OT Short Term Goal 3 - Progress (Week 1): Progressing toward goal  Skilled Therapeutic Interventions/Progress Updates:    1;1. Pt received in TIS agreeable to OT with no pain reported. Pt completes oral care seated in TIS with HOH A to incorporate LUE into gross grasp of toothpaste to open/close prior to oral care. Pt bathes with HOH A to wash RUE with LUE and A to wash back. Pt dons shirt with MAX A to thread BUE (d/t IV) and head. Pt able to pull down back. Pt completes towel slides with LUE with mod facilitation at scapula and elbow for shoulder flex/ext, exlbow flex/ext, int/ext rotation and horizontal ab/adduction. Exited sessionw iht pt seated in TIS, call light tin reach and all needs met  Therapy Documentation Precautions:  Precautions Precautions: Fall Precaution Comments: Sacral decubitus wounds, Lt hemi Restrictions Weight Bearing Restrictions: No General:   Vital Signs:   Pain: Pain Assessment Pain Scale: 0-10 Pain Score: 0-No pain ADL: ADL Eating: Maximal assistance(for sitting balance) Where Assessed-Eating: Edge of bed Grooming: Maximal assistance Where Assessed-Grooming: Edge of bed Upper Body Bathing: Maximal assistance Where Assessed-Upper Body Bathing: Edge of bed Lower Body Bathing: Dependent(+2  assist) Where Assessed-Lower Body Bathing: Bed level Upper Body Dressing: Dependent Where Assessed-Upper Body Dressing: Edge of bed Lower Body Dressing: Dependent(+2 assist) Where Assessed-Lower Body Dressing: Bed level Toileting: Not assessed Toilet Transfer: Not assessed Tub/Shower Transfer: Not assessed Vision   Perception    Praxis   Exercises:   Other Treatments:     Therapy/Group: Individual Therapy  Tonny Branch 10/15/2019, 9:59 AM

## 2019-10-15 NOTE — Progress Notes (Signed)
Speech Language Pathology Daily Session Note  Patient Details  Name: [name] MRN: [id number] Date of Birth: [birth year]  Today's Date: 10/15/2019 SLP Individual Time: 1330-1415 SLP Individual Time Calculation (min): 45 min  Short Term Goals: Week 2: SLP Short Term Goal 1 (Week 2): Patient will complete higher level reasoning and executive functioning tasks (ex: med and money management) with 90% accuracy and min cues. SLP Short Term Goal 2 (Week 2): Patient will state and demonstrate compensatory cognitive-linguistic strategies as trained with 90% accuracy and min cues. SLP Short Term Goal 3 (Week 2): Patient will complete delayed recall tasks for 5 stimuil post ~5 minute delay with 80% accuracy and min cues.  Skilled Therapeutic Interventions:  Skilled treatment session focused on completing Cognitive Linguistic Quick Test. Pt obtained below average scores in executive function, higher level attention and memory. Specifically, deficits observed in task organization, planning and sequencing. Pt demonstrated increased self-monitoring and attempted to self-correct answered on several occasions. This is great progress and good use of strategies previous taught in sessions. Continue per current plan of care.      Pain    Therapy/Group: Individual Therapy  Ekaterini Capitano 10/15/2019, 3:42 PM

## 2019-10-15 NOTE — Consult Note (Signed)
Neuropsychological Consultation   Patient:   [name]   DOB:   May 04, 1951  MR Number:  [id number]  Location:  [facility] [place] [facility] [id number] [place] [address] Dept: [place]: [phone]           Date of Service:   10/15/2019  Start Time:   10 AM End Time:   11 AM  Provider/Observer:  Ilean Skill, Psy.D.       Clinical Neuropsychologist       Billing Code/Service: [phone]  Chief Complaint:    [name] is a 68 year old male with history of GERD.  Patient presented to [facility] on 07/26/2019 with SOB and hypoxia.  Dx with COVID 19 on July 13, 2019.  Patient treated and did require intubation.  Hospital course further complicated by bouts of afib.  Neurology consulted on 08/27/2019 for altered mental status with MRI showing small acute to early subacute infarcts in bilateral cerebral cortex and right cerebellum.  Patient also developed sacral decubitus   Reason for Service:  Patient referred for neuropsychological consultation due to coping and adjustment issues.  Below is the HPI for the current admission.  HPI: [name] is a 68 year old right-handed male with history of GERD who presented to [facility] 07/26/2019 with shortness of breath and hypoxia.  He was diagnosed with COVID-19 on September 20 of 2020.  Oxygen saturations in the 40th percentile range.  Placed on a nonrebreather mask.  Patient was given IV steroids and remdesivir that he completed 07/30/2019.  He did require intubation for airway protection.  He had 1 unit of convalescent plasma and had received Decadron until 08/04/2019.  He was treated with vancomycin and cefepime through 08/02/2019.  Initial chest x-ray demonstrated diffuse multifocal infiltrates.  CT of the chest negative for pulmonary emboli.  He was admitted to [facility] pH 7.41, PCO2 44 PO2 of 87 on 100% and AA gradient  calculates out to 571.  Noted elevated creatinine of 1.51.  Renal service is consulted for elevated creatinine/AKI necessitating the need for hemodialysis 08/07/2019 but has since been deemed chronic Tuesday Thursday Saturday schedule.  Gastroenterology services consulted for ongoing melena drop in hemoglobin and patient was transfused 10 units of packed red blood cells total throughout hospital admission with latest hemoglobin 8.4.  An endoscopy was completed showing no active bleeding or blood in the stomach.  Very large gastric residual mostly tube feeding without anatomic gastric outlet obstruction suggestive gastric ileus.  Patient's hemoglobin hematocrit have stabilized.  Long-term intubation requiring tracheostomy tube 08/22/2019 patient is since been extubated.  Diet has been advanced to regular consistency.  Subcutaneous heparin for DVT prophylaxis.  Hospital course complicated by bouts of atrial fibrillation with RVR 08/09/2019 patient was initially placed on amiodarone.  Neurology service was consulted 08/27/2019 for altered mental status with MRI of the brain showing small acute to early subacute infarcts in bilateral cerebral cortex and right cerebellum and maintained on low-dose aspirin for CVA prophylaxis.. Patient with development of sacral decubitus wound care dressing changes as per wound care nurse as well as wound care to partial-thickness areas of tissue loss at posterior aspect of penile shaft again with dressing changes as directed.  Therapy evaluations completed patient was admitted for a comprehensive rehab program.   Current Status:  The patient was in positive mood.  Left side motor deficits noted.  Patient with good mental status overall with  still weak and worried about how residual motor deficits/late effects of CVA will impact him and his family in future.    Behavioral Observation: [name]  presents as a 68 y.o.-year-old Right Caucasian Male who appeared his stated age. his  dress was Appropriate and he was Well Groomed and his manners were Appropriate to the situation.  his participation was indicative of Appropriate and Redirectable behaviors.  There were any physical disabilities noted.  he displayed an appropriate level of cooperation and motivation.     Interactions:    Active Appropriate  Attention:   abnormal and attention span appeared shorter than expected for age  Memory:   within normal limits; recent and remote memory intact  Visuo-spatial:  not examined  Speech (Volume):  low  Speech:   normal; normal  Thought Process:  Coherent and Relevant  Though Content:  WNL; not suicidal and not homicidal  Orientation:   person, place, time/date and situation  Judgment:   Good  Planning:   Fair  Affect:    Appropriate and Lethargic  Mood:    Euthymic  Insight:   Fair  Intelligence:   normal  Medical History:   Past Medical History:  Diagnosis Date  . COVID-19   . Heartburn    Psychiatric History:  No prior psychiatric history.  Family Med/Psych History:  Family History  Problem Relation Age of Onset  . Colon cancer Neg Hx   . Colon polyps Neg Hx     Risk of Suicide/Violence: virtually non-existent   Impression/DX:  [name] is a 68 year old male with history of GERD.  Patient presented to [facility] on 07/26/2019 with SOB and hypoxia.  Dx with COVID 19 on July 13, 2019.  Patient treated and did require intubation.  Hospital course further complicated by bouts of afib.  Neurology consulted on 08/27/2019 for altered mental status with MRI showing small acute to early subacute infarcts in bilateral cerebral cortex and right cerebellum.  Patient also developed sacral decubitus   The patient was in positive mood.  Left side motor deficits noted.  Patient with good mental status overall with still weak and worried about how residual motor deficits/late effects of CVA will impact him and his family in  future.  Disposition/Plan:  Will follow up with patient next week due to coping issues and residual CVA.  Diagnosis:    Cerebrovascular accident (CVA), unspecified mechanism ([place]) - Plan: Ambulatory referral to Neurology  Debility         Electronically Signed   _______________________ Ilean Skill, Psy.D.

## 2019-10-15 NOTE — Patient Care Conference (Signed)
Inpatient RehabilitationTeam Conference and Plan of Care Update Date: 10/15/2019   Time: 10:00 AM   Patient Name: [name]      Medical Record Number: [id number]  Date of Birth: [birth year] Sex: Male         Room/Bed: 4W03C/4W03C-01 Payor Info: Payor: [employer] MEDICARE / Plan: [employer]MO / Product Type: *No Product type* /    Admit Date/Time:  10/03/2019  2:27 PM  Primary Diagnosis:  Debility  Patient Active Problem List   Diagnosis Date Noted  . Sleep disturbance   . Anemia of chronic disease   . Dependent on hemodialysis ([place])   . Dysphagia, post-stroke   . Pressure injury of sacral region, unstageable ([place])   . Oropharyngeal dysphagia 10/03/2019  . Debility 10/03/2019  . Acute blood loss anemia 09/06/2019  . H. pylori infection 09/06/2019  . CVA (cerebral vascular accident) ([place]) 09/06/2019  . Anoxic brain injury ([place]) 09/06/2019  . MSSA bacteremia 09/06/2019  . Positive D dimer 09/06/2019  . Pseudomonas infection 09/06/2019  . Infection due to acinetobacter baumannii 09/06/2019  . Sinus tachycardia 09/05/2019  . Advanced care planning/counseling discussion   . Goals of care, counseling/discussion   . Palliative care by specialist   . Cerebral thrombosis with cerebral infarction 08/28/2019  . Tracheostomy status ([place])   . Acute respiratory distress syndrome (ARDS) due to COVID-19 virus ([place])   . Chest tube in place   . Primary spontaneous pneumothorax   . Acute respiratory failure ([place])   . Gastric ulcer with hemorrhage 08/10/2019  . Constipation 08/09/2019  . Atrial fibrillation with RVR ([place]) 08/07/2019  . Acute renal failure (ARF) ([place]) 08/06/2019  . GI bleed 08/06/2019  . Pneumothorax on right   . Fluid overload 07/27/2019  . Pneumonia due to COVID-19 virus 07/25/2019  . Acute respiratory failure due to COVID-19 ([place]) 07/25/2019  . AKI (acute kidney injury) ([place]) 07/25/2019  . Dyspepsia 11/26/2014  . BPH (benign prostatic hyperplasia) 10/31/2013   . Achilles rupture 04/05/2011  . S/P Achilles tendon repair 04/05/2011    Expected Discharge Date: Expected Discharge Date: 11/07/19  Team Members Present: Physician leading conference: Dr. Alysia Penna Social Worker Present: Ovidio Kin, LCSW Nurse Present: Rosita Fire, RN Case manager: Karene Fry, RN PT Present: Barrie Folk, PT OT Present: Laverle Hobby, OT SLP Present: Stormy Fabian, SLP PPS Coordinator present : Gunnar Fusi, SLP     Current Status/Progress Goal Weekly Team Focus  Bowel/Bladder   continent of bowel & bladder, LBM 12/21  remain continent  continue to monitor & assist as needed   Swallow/Nutrition/ Hydration             ADL's   Total A of 1 for LB self care bedlevel, Max A UB self care tasks w/c level at sink, +2 for slideboard transfers  Downgraded to Mod A overall  NMR, ADL retraining, sitting balanace, trunk control, functional transfers, cognition, family education   Mobility   Albert rollingn to L, maxA to right, mechnical lift or three muskateers style STS, modA initially improving to SBA for sitting balance  Albert overall  bed mobility, OOB tolerance, L NMR, sitting balance   Communication             Safety/Cognition/ Behavioral Observations  Mod-Min A, impacted by fatigue  Supervision A - might need to be downgraded  complex problem solving/executive function and recall strategies   Pain   no c/o pain on shift/ has tylenol & hycet prn/ did not get either  today  pain scale <4/10  assess & treat as needed   Skin   unstageable ulcer with multiple tracts & undermining to coccyx with copious purulent drainage, drsg changed to iodoform packing TID  no new areas of skin break down, no infection  assess q shift      *See Care Plan and progress notes for long and short-term goals.     Barriers to Discharge  Current Status/Progress Possible Resolutions Date Resolved   Nursing  Wound Care               PT                    OT                   SLP                SW                Discharge Planning/Teaching Needs:  Wife hopeful he will do better than therapies think, once sacral pain is better. Question if wife can provide mod assist level. Have encouraged her to attend therapies with him to see his level.      Team Discussion: Surgery saw patient, no surgery for sacral wound, IV abx, on HD, renal improving, plan to continue HD as outpatient.  RN - cont B/B, fistula, R knee tender, voltaren gel used.  OT +2 slide board, goals mod A overall.  PT min a roll L, mod/max roll R, max slide board, stand +2 assist, in tilt in space chair, goals min a, may downgrade to mod A. SLP cognition min/mod a complex higher level tasks, goals S, ?downgrade to min A.  Very deconditioned.   Revisions to Treatment Plan: N/A     Medical Summary Current Status: scral wound still draining , now on [place] adn Ceftriaxone, no surgery advised, renal function improving Weekly Focus/Goal: cont HD, wound care IV abx  Barriers to Discharge: IV antibiotics;Medical stability;Hemodialysis   Possible Resolutions to Barriers: see above, cont wound care, may potentially switch to iodophor gauze   Continued Need for Acute Rehabilitation Level of Care: The patient requires daily medical management by a physician with specialized training in physical medicine and rehabilitation for the following reasons: Direction of a multidisciplinary physical rehabilitation program to maximize functional independence : Yes Medical management of patient stability for increased activity during participation in an intensive rehabilitation regime.: Yes Analysis of laboratory values and/or radiology reports with any subsequent need for medication adjustment and/or medical intervention. : Yes   I attest that I was present, lead the team conference, and concur with the assessment and plan of the team.   Retta Diones 10/15/2019, 5:11 PM  Team conference was held via web/  teleconference due to [place] - 19

## 2019-10-15 NOTE — Progress Notes (Signed)
Physical Therapy Session Note  Patient Details  Name: [name] MRN: [id number] Date of Birth: 1951/06/12  Today's Date: 10/15/2019 PT Individual Time: 0810-0850 and 1635-1730 PT Individual Time Calculation (min): 40 minand 55 min    Short Term Goals: Week 2:  PT Short Term Goal 1 (Week 2): Pt will maintain sitting balance EOB x 5 minutes with min assist PT Short Term Goal 2 (Week 2): Pt will perform bed<>WC transfers with max assist of 1 intermittiently PT Short Term Goal 3 (Week 2): Pt will initiate WC propulsion PT Short Term Goal 4 (Week 2): Pt will initiate gait training with mechanical lift up to 58f  Skilled Therapeutic Interventions/Progress Updates:  Session 1  Pt received supine in bed, with RN administering Medication and pt finishing breakfast. Pt asked PT to return in a few minutes. Pt then and agreeable to PT with wound care RN present. Rolling to the L with min assist and min assist to sustain sidelying while RN dressed tunneling wound.  Supine>sit transfer with max assist and max cues for set up and postural control through transfer. Sit<>stand in stedy with max A +2. [facility] Sit<>stand x 2 from steady with max assist +2 to don pants. Pt with difficulty coordinating postural muscles to achieve full erect posture. Pt left sitting in WC with call bell in reach and all needs met.    Session 2.   Pt received supine in bed and agreeable to PT. Pt reports need for possible BM. rolling to the R to force activation of L side trunk musculature to place bed pan. Pt unable to void. PT performed all management of clothing. Supine>sit transfer with max assist on the L side. Mild orthostatic s/s in sitting which cleared in ~ 10 seconds. Sit<>stand in stedy with max assist of 1. [facility]   Pt transported to rail in hall. Sit<>stand x 4 with max assist to facilitate anterior weight shift and stabilize the LLE. Pt able to sustain standing for 10,  5, 15 and 20 seconds respectively. Constant posterior lean, requiring max multimodal cues to correct including visual feed back from mirror. Pt returned to room and performed partial sit<>stand in stedy x 2 but unable to generate adequate anterior weight shift to put seat down and transfer in steady.   SB transfer to EOB with mod-max assist of 1 and max multimodal cues for initiation of movement lift off gluteal surface and increased anterior weight shift. Sit>supine with max assist to control trunk and the LLE. Pt left supine in bed with call bell in reach and all needs met.       Therapy Documentation Precautions:  Precautions Precautions: Fall Precaution Comments: Sacral decubitus wounds, Lt hemi Restrictions Weight Bearing Restrictions: No    Vital Signs: Therapy Vitals Temp: 98 F (36.7 C) Pulse Rate: 94 Resp: 18 BP: 132/80 Patient Position (if appropriate): Lying Oxygen Therapy SpO2: 99 % O2 Device: Room Air Pain: Pain Assessment Pain Scale: 0-10 Pain Score: 0-No pain    Therapy/Group: Individual Therapy  ALorie [name]12/23/2020, 8:52 AM

## 2019-10-15 NOTE — Plan of Care (Signed)
  Problem: Consults Goal: RH STROKE PATIENT EDUCATION Description: See Patient Education module for education specifics  Outcome: Progressing Goal: Nutrition Consult-if indicated Outcome: Progressing   Problem: RH BOWEL ELIMINATION Goal: RH STG MANAGE BOWEL WITH ASSISTANCE Description: STG Manage Bowel with min/mod Assistance. Outcome: Progressing Goal: RH STG MANAGE BOWEL W/MEDICATION W/ASSISTANCE Description: STG Manage Bowel with Medication with mod I Assistance. Outcome: Progressing   Problem: RH BLADDER ELIMINATION Goal: RH STG MANAGE BLADDER WITH ASSISTANCE Description: STG Manage Bladder With min/mod Assistance Outcome: Progressing   Problem: RH SKIN INTEGRITY Goal: RH STG SKIN FREE OF INFECTION/BREAKDOWN Description: Patients skin will remain free from further infection or breakdown with mod assist. Outcome: Progressing Goal: RH STG MAINTAIN SKIN INTEGRITY WITH ASSISTANCE Description: STG Maintain Skin Integrity With mod Assistance. Outcome: Progressing Goal: RH STG ABLE TO PERFORM INCISION/WOUND CARE W/ASSISTANCE Description: STG Able To Perform Incision/Wound Care With total Assistance from caregiver. Outcome: Progressing   Problem: RH SAFETY Goal: RH STG ADHERE TO SAFETY PRECAUTIONS W/ASSISTANCE/DEVICE Description: STG Adhere to Safety Precautions With supervision Assistance/Device. Outcome: Progressing   Problem: RH PAIN MANAGEMENT Goal: RH STG PAIN MANAGED AT OR BELOW PT'S PAIN GOAL Description: < 4 Outcome: Progressing   Problem: RH KNOWLEDGE DEFICIT Goal: RH STG INCREASE KNOWLEDGE OF HYPERTENSION Description: Patient/caregiver will verbalize understanding of management of HTN including diet, medications, monitoring, exercise, and follow up appointments with min assist. Outcome: Progressing Goal: RH STG INCREASE KNOWLEGDE OF HYPERLIPIDEMIA Description: Patient/caregiver will verbalize understanding of management of HLD including diet, medications,  monitoring, exercise, and follow up appointments with min assist. Outcome: Progressing Goal: RH STG INCREASE KNOWLEDGE OF STROKE PROPHYLAXIS Description: Patient/caregiver will verbalize understanding of management of stroke prophylaxis including diet, medications, monitoring, exercise, and follow up appointments with min assist. Outcome: Progressing

## 2019-10-15 NOTE — Progress Notes (Signed)
Patient noted alert & responsive this shift. Wound was dressed at approximately 2100. It was packed with iodoform & still has purulent, yellowish drainage. Wound was measured & has multiple tracts or tunnels. One noted at 11 o clock, 6 o clock, 12 o clock & 8 o clock. The largest was at 1 o clock at 5.5cm. Undermining is around the clock. No c/o pain or discomfort. Wound dressed as ordered. Will continue to monitor for changes.

## 2019-10-16 ENCOUNTER — Inpatient Hospital Stay (HOSPITAL_COMMUNITY): Payer: Medicare HMO | Admitting: Physical Therapy

## 2019-10-16 ENCOUNTER — Inpatient Hospital Stay (HOSPITAL_COMMUNITY): Payer: Medicare HMO | Admitting: Speech Pathology

## 2019-10-16 ENCOUNTER — Inpatient Hospital Stay (HOSPITAL_COMMUNITY): Payer: Medicare HMO | Admitting: *Deleted

## 2019-10-16 LAB — BASIC METABOLIC PANEL
Anion gap: 11 (ref 5–15)
BUN: 27 mg/dL — ABNORMAL HIGH (ref 8–23)
CO2: 27 mmol/L (ref 22–32)
Calcium: 10.1 mg/dL (ref 8.9–10.3)
Chloride: 100 mmol/L (ref 98–111)
Creatinine, Ser: 3.88 mg/dL — ABNORMAL HIGH (ref 0.61–1.24)
GFR calc Af Amer: 17 mL/min — ABNORMAL LOW (ref >60)
GFR calc non Af Amer: 15 mL/min — ABNORMAL LOW (ref >60)
Glucose, Bld: 94 mg/dL (ref 70–99)
Potassium: 3.4 mmol/L — ABNORMAL LOW (ref 3.5–5.1)
Sodium: 138 mmol/L (ref 135–145)

## 2019-10-16 LAB — CBC
HCT: 27.2 % — ABNORMAL LOW (ref 39.0–52.0)
Hemoglobin: 8 g/dL — ABNORMAL LOW (ref 13.0–17.0)
MCH: 28.1 pg (ref 26.0–34.0)
MCHC: 29.4 g/dL — ABNORMAL LOW (ref 30.0–36.0)
MCV: 95.4 fL (ref 80.0–100.0)
Platelets: 356 10*3/uL (ref 150–400)
RBC: 2.85 MIL/uL — ABNORMAL LOW (ref 4.22–5.81)
RDW: 17.1 % — ABNORMAL HIGH (ref 11.5–15.5)
WBC: 7.4 10*3/uL (ref 4.0–10.5)
nRBC: 0 % (ref 0.0–0.2)

## 2019-10-16 MED ORDER — HEPARIN SODIUM (PORCINE) 1000 UNIT/ML IJ SOLN
INTRAMUSCULAR | Status: AC
  Administered 2019-10-16: 3800 [IU] via INTRAVENOUS_CENTRAL
  Filled 2019-10-16: qty 4

## 2019-10-16 MED ORDER — SODIUM CHLORIDE 0.9 % IV SOLN
125.0000 mg | INTRAVENOUS | Status: AC
  Administered 2019-10-16 – 2019-10-23 (×3): 125 mg via INTRAVENOUS
  Filled 2019-10-16 (×5): qty 10

## 2019-10-16 NOTE — Progress Notes (Signed)
[facility] Progress Note   Dialysis Orders: arranged for [facility]  Assessment/Plan: 1.S/p acute hypoxemia and respiratory failure due to COVID 32:PQDIYMEB complications/ secondary infections, prolonged ICU course. R chest tubeout,trach removed 11/30. Transferred to CIR 12/11 2.ESRD vs prolonged AKI - Cr declining lately- patient still hopeful for recovery :during near 2 month acute admission for COVID/PNA/hypoixc resp failure now on TTSschedule -Admitted 10/3 and started CRRT on 08/07/19, has been on RRT for a little under 2 mos now.  Foley out - generally requiring added K bath Cr has not increased as much better treatments. HD today and then check daily Cr levels.  Little I/O recorded.  Encouraged at least 6- c fluid per day to gauge urine  output..no heparin due to GIB hx. On TTS schedule - here - HD today and next will be Sunday due to holiday schedule 3.HTN/volume:BPwell controlled. Net UF 1.3 Tuesday; not sure intake is accurately recorded- may want to add diuretic - follow I/O today though Patient reminded to safe all UOP 4. Anemia: hgb 8.3  Continue Aranesp151mcg q Sat.-   tsat 17% 11/29 - tsat 19% 12/23 - will give short course Fe 5. Secondary hyperparathyroidism:CorrCa slightly high. Not on calcium or vit D, use low Ca bath- limited to 2.25 due to need for 4 K. Phos nowatgoal.iPTHsuppressed at 28 6.RAX:ENMMHWKGSUPJS cortex and right cerebellum small acute to subacuteinfarctswithneuromuscular weakness/encephalopathy. 7.Nutrition: Alb very low - post COVID anorexia due to lack of taste. Addednepro + pro-stat supplements. Diet liberalized. Encouraged nutrition. 8.Sacral decub: Pressure ulcer - wound care following.Wound culture showed rare serratia marcescens, susceptibility pending. Per [facility] nurse if drainage not "handled" by current tunneled dressing, then will likely need a surgery consult to investigate if an abscess is present.  9.  Debility- per CIR, making slow progress  Myriam Jacobson, PA-C [facility] Beeper [phone] 10/16/2019,11:41 AM  LOS: 13 days   Subjective:   Working with PT - stood up with and took a few steps with assistance with PT  Objective Vitals:   10/15/19 0511 10/15/19 1303 10/15/19 1914 10/15/19 2300  BP: 132/80 121/77  (!) 153/84  Pulse: 94 (!) 104  (!) 101  Resp: 18 16  18   Temp: 98 F (36.7 C) 98.5 F (36.9 C)  98.6 F (37 C)  TempSrc:    Oral  SpO2: 99% 100% 98% 98%  Height: 5\' 10"  (1.778 m)      Physical Exam General: breathing easily on room air. Heart: RRR Lungs: no rales Abdomen: soft NT Extremities: no LE edema Dialysis Access: [facility]   Additional Objective Labs:  Intake/Output Summary (Last 24 hours) at 10/16/2019 1141 Last data filed at 10/16/2019 0217 Gross per 24 hour  Intake 320 ml  Output 225 ml  Net 95 ml   Basic Metabolic Panel: Recent Labs  Lab 10/09/19 1508 10/11/19 1402 10/13/19 0900 10/14/19 1809 10/15/19 1102 10/16/19 0540  NA 136 137  --  135 139 138  K 3.4* 3.9  --  3.5 3.9 3.4*  CL 99 103  --  97* 100 100  CO2 26 28   < > 28 28 27   GLUCOSE 90 97  --  105* 97 94  BUN 39* 35*  --  11 21 27*  CREATININE 4.95* 4.51*  --  2.22* 3.35* 3.88*  CALCIUM 9.7 9.6   < > 8.6* 9.7 10.1  PHOS 2.8 3.8  --  1.5*  --   --    < > = values  in this interval not displayed.   Liver Function Tests: Recent Labs  Lab 10/09/19 1508 10/11/19 1402 10/14/19 1809  ALBUMIN 2.3* 2.2* 2.6*   No results for input(s): LIPASE, AMYLASE in the last 168 hours. CBC: Recent Labs  Lab 10/09/19 1507 10/11/19 1403 10/13/19 0900 10/13/19 1245 10/14/19 1809  WBC 11.5* 7.5  --  8.6 9.9  NEUTROABS  --  4.8  --   --   --   HGB 7.6* 7.4* 8.8* 9.0* 8.3*  HCT 25.4* 24.7* 26.0* 30.9* 27.7*  MCV 93.7 93.9  --  94.8 93.3  PLT 342 321  --  350 296   Blood Culture    Component Value Date/Time   SDES WOUND COCCYX 10/09/2019 1154   SPECREQUEST NONE  10/09/2019 1154   CULT  10/09/2019 1154    RARE SERRATIA MARCESCENS RARE BACTEROIDES FRAGILIS BETA LACTAMASE POSITIVE Performed at [facility] [address], [place], [place] [address]    REPTSTATUS 10/13/2019 FINAL 10/09/2019 1154    Cardiac Enzymes: No results for input(s): CKTOTAL, CKMB, CKMBINDEX, TROPONINI in the last 168 hours. CBG: No results for input(s): GLUCAP in the last 168 hours. Iron Studies:  Recent Labs    10/15/19 1102  IRON 30*  TIBC 158*   Lab Results  Component Value Date   INR 1.1 10/13/2019   INR 1.2 09/02/2019   INR 1.0 08/22/2019   Studies/Results: No results found. Medications: . sodium chloride    . sodium chloride    . cefTRIAXone (ROCEPHIN)  IV Stopped (10/16/19 5366)   . aspirin EC  81 mg Oral Daily  . atorvastatin  40 mg Oral q1800  . budesonide (PULMICORT) nebulizer solution  0.5 mg Nebulization BID  . chlorhexidine  15 mL Mouth/Throat BID  . Chlorhexidine Gluconate Cloth  6 each Topical Q0600  . darbepoetin (ARANESP) injection - DIALYSIS  150 mcg Intravenous Q Sat-HD  . diclofenac Sodium  2 g Topical QID  . feeding supplement (NEPRO CARB STEADY)  237 mL Oral TID BM  . feeding supplement (PRO-STAT SUGAR FREE 64)  30 mL Oral BID  . heparin  5,000 Units Subcutaneous Q8H  . hydrocerin   Topical TID  . magic mouthwash w/lidocaine  5 mL Oral QID  . metroNIDAZOLE  500 mg Oral Q8H  . multivitamin  1 tablet Oral QHS  . pantoprazole  40 mg Oral Q1200  . ramelteon  8 mg Oral QHS  . senna-docusate  2 tablet Oral BID  . sucralfate  1 g Oral Q6H

## 2019-10-16 NOTE — Plan of Care (Signed)
  Problem: Consults Goal: RH STROKE PATIENT EDUCATION Description: See Patient Education module for education specifics  Outcome: Progressing Goal: Nutrition Consult-if indicated Outcome: Progressing   Problem: RH BOWEL ELIMINATION Goal: RH STG MANAGE BOWEL W/MEDICATION W/ASSISTANCE Description: STG Manage Bowel with Medication with mod I Assistance. Outcome: Progressing   Problem: RH BLADDER ELIMINATION Goal: RH STG MANAGE BLADDER WITH ASSISTANCE Description: STG Manage Bladder With min/mod Assistance Outcome: Progressing   Problem: RH SKIN INTEGRITY Goal: RH STG SKIN FREE OF INFECTION/BREAKDOWN Description: Patients skin will remain free from further infection or breakdown with mod assist. Outcome: Progressing Goal: RH STG MAINTAIN SKIN INTEGRITY WITH ASSISTANCE Description: STG Maintain Skin Integrity With mod Assistance. Outcome: Progressing Goal: RH STG ABLE TO PERFORM INCISION/WOUND CARE W/ASSISTANCE Description: STG Able To Perform Incision/Wound Care With total Assistance from caregiver. Outcome: Progressing   Problem: RH SAFETY Goal: RH STG ADHERE TO SAFETY PRECAUTIONS W/ASSISTANCE/DEVICE Description: STG Adhere to Safety Precautions With supervision Assistance/Device. Outcome: Progressing   Problem: RH PAIN MANAGEMENT Goal: RH STG PAIN MANAGED AT OR BELOW PT'S PAIN GOAL Description: < 4 Outcome: Progressing   Problem: RH KNOWLEDGE DEFICIT Goal: RH STG INCREASE KNOWLEDGE OF HYPERTENSION Description: Patient/caregiver will verbalize understanding of management of HTN including diet, medications, monitoring, exercise, and follow up appointments with min assist. Outcome: Progressing Goal: RH STG INCREASE KNOWLEGDE OF HYPERLIPIDEMIA Description: Patient/caregiver will verbalize understanding of management of HLD including diet, medications, monitoring, exercise, and follow up appointments with min assist. Outcome: Progressing Goal: RH STG INCREASE KNOWLEDGE OF STROKE  PROPHYLAXIS Description: Patient/caregiver will verbalize understanding of management of stroke prophylaxis including diet, medications, monitoring, exercise, and follow up appointments with min assist. Outcome: Progressing   Problem: RH BOWEL ELIMINATION Goal: RH STG MANAGE BOWEL WITH ASSISTANCE Description: STG Manage Bowel with min/mod Assistance. Outcome: Not Progressing

## 2019-10-16 NOTE — Progress Notes (Signed)
Physical Therapy Weekly Progress Note  Patient Details  Name: [name] MRN: [id number] Date of Birth: 1951-01-01  Beginning of progress report period: October 11, 2019 End of progress report period: October 16, 2019  Today's Date: 10/16/2019 PT Individual Time: 1005-1120 PT Individual Time Calculation (min): 75 min   Patient has met 3 of 4 short term goals.  Pt is making steady progress towards LTGs and has progressed to max assist of 1 for bed mobility and transfers to [facility] with squat pivot. Pt has initiated WC mobility in standard WC as well as initiated gait training with max assist +2 for safety.      Patient continues to demonstrate the following deficits muscle weakness, muscle joint tightness and muscle paralysis, decreased cardiorespiratoy endurance, impaired timing and sequencing, abnormal tone, unbalanced muscle activation, motor apraxia, ataxia and decreased coordination, decreased initiation, decreased attention, decreased awareness, decreased problem solving, decreased safety awareness, decreased memory and delayed processing and decreased sitting balance, decreased standing balance, decreased postural control, hemiplegia and decreased balance strategies and therefore will continue to benefit from skilled PT intervention to increase functional independence with mobility.   Patient progressing toward long term goals..  Continue plan of care.  PT Short Term Goals Week 2:  PT Short Term Goal 1 (Week 2): Pt will maintain sitting balance EOB x 5 minutes with min assist PT Short Term Goal 1 - Progress (Week 2): Met PT Short Term Goal 2 (Week 2): Pt will perform bed<>WC transfers with max assist of 1 intermittiently PT Short Term Goal 2 - Progress (Week 2): Met PT Short Term Goal 3 (Week 2): Pt will initiate WC propulsion PT Short Term Goal 3 - Progress (Week 2): Met PT Short Term Goal 4 (Week 2): Pt will initiate gait training with mechanical lift up to 74f PT Short Term Goal  4 - Progress (Week 2): Progressing toward goal Week 3:  PT Short Term Goal 1 (Week 3): Pt will transfer to and from bed with mod assist and LRAD with lateral scoot/squat pivot PT Short Term Goal 2 (Week 3): Pt will ambulate >133fwith max assist +2 and LRAD PT Short Term Goal 3 (Week 3): Pt will propell WC >10081fith supervision assist. PT Short Term Goal 4 (Week 3): pt will maintain sitting balance EOB with supervision assist for improved independence with ADLs PT Short Term Goal 5 (Week 3): Pt will tolerate standing up to 1 min  with mod assist and LRAD  Skilled Therapeutic Interventions/Progress Updates:   Pt received sitting in WC and agreeable to PT. Pt transported to rail in hall. Sit<>stand x 3 with max assist to stabilize the LLE and facilitate anterior weight shift. Standing tolerance 3 x 30 sec with mod assist from PT to achieve extension in BLE hips and knees. Prolonged rest break between bouts.   Following last bout of standing tolerance to placed in standard WC. WC mobility with RUE and RLE x 100f79fth min assist to maintain straight path. Pt then performed with BLE and RUE x 75ft11f forces use of LLE in reciprocal pattern.   Gait training at rail in hall 2 x 4 ft with max assist from for LLE limb advancement and to block in stance to prevent buckle as well as + 2 for close WC follow. Noted ataxia in BLE/trunk.   Kinetron reciprocal movement training 4 x 45 sec with mod cues for improved hip flexion in the RLE to encourage reciprocal movement pattern.   Pt returned  to room and performed squat pivot transfer to bed with max assist fro mPT for safety and cues for sequecning. Sit>supine completed with mod assist for LLE control, and left supine in bed with call bell in reach and all needs met.        Therapy Documentation Precautions:  Precautions Precautions: Fall Precaution Comments: Sacral decubitus wounds, Lt hemi Restrictions Weight Bearing Restrictions:  No   Pain: Pain Assessment Pain Scale: 0-10 Pain Score: 0-No pain   Therapy/Group: Individual Therapy  [name] [name] 10/16/2019, 12:13 PM

## 2019-10-16 NOTE — Progress Notes (Signed)
[facility] Progress Note   Dialysis Orders: arranged for [facility]  Assessment/Plan: 1.S/p acute hypoxemia and respiratory failure due to COVID 45:YKDXIPJA complications/ secondary infections, prolonged ICU course. R chest tubeout,trach removed 11/30. Transferred to CIR 12/11 2.[place] near 2 month acute admission for COVID/PNA/hypoixc resp failure now on TTSschedule -Admitted 10/3 and started CRRT on 08/07/19, has been on RRT for a little under 2 mos now. Cr 2.22 pre HD 12/22 significant improvement - repeat today and daily - may be able to hold HD.Outpatient HD set up at [facility]. Foley out - generally requiring added K bath.no heparin due to GIB.  Plan repeat labs in am to decide on HD for Thursday or just hold and follow  3.HTN/volume:BPwell controlled. Net UF 1.3 Tuesday; not sure intake is accurately recorded- may want to add diuretic - follow I/O today though 4. Anemia: hgb 8.3  Continue Aranesp178mcg q Sat.-   tsat 17% 11/29 - repeat Fe studies today 5. Secondary hyperparathyroidism:CorrCa slightly high. Not on calcium or vit D, use low Ca bath- limited to 2.25 due to need for 4 K. Phos nowatgoal.iPTHsuppressed at 28 6.SNK:NLZJQBHALPFXT cortex and right cerebellum small acute to subacuteinfarctswithneuromuscular weakness/encephalopathy. 7.Nutrition: Alb very low - post COVID anorexia due to lack of taste. Addednepro + pro-stat supplements. Diet liberalized. Encouraged nutrition. 8.Sacral decub: Pressure ulcer - wound care following.Wound culture showed rare serratia marcescens, susceptibility pending. Per [facility] nurse if drainage not "handled" by current tunneled dressing, then will likely need a surgery consult to investigate if an abscess is present.  9. Debility- per CIR  12:46 ADDENDUM:  On further evaluation - Cr is down because it was drawn POST HD yesterday.  It was not drawn pre HD.  Cr is 3.35 today He still  needs dialysis. I have discussed this with him and he is disappointed but understands - also encouraged to saw ALL urine. Next HD tomorrow   Myriam Jacobson, PA-C [facility] [phone] 10/16/2019,10:31 AM  LOS: 13 days   Subjective:   Planning on buying a [facility] after discharge. Pleased about improvement in Cr  Objective Vitals:   10/15/19 0511 10/15/19 1303 10/15/19 1914 10/15/19 2300  BP: 132/80 121/77  (!) 153/84  Pulse: 94 (!) 104  (!) 101  Resp: 18 16  18   Temp: 98 F (36.7 C) 98.5 F (36.9 C)  98.6 F (37 C)  TempSrc:    Oral  SpO2: 99% 100% 98% 98%  Height: 5\' 10"  (1.778 m)      Physical Exam General: sitting up in WC - spirits good Heart: RRR Lungs: no rales Abdomen: soft NT Extremities: no LE edema Dialysis Access: [facility]   Additional Objective Labs:  Intake/Output Summary (Last 24 hours) at 10/16/2019 1031 Last data filed at 10/16/2019 0217 Gross per 24 hour  Intake 320 ml  Output 225 ml  Net 95 ml   Basic Metabolic Panel: Recent Labs  Lab 10/09/19 1508 10/11/19 1402 10/13/19 0900 10/14/19 1809 10/15/19 1102 10/16/19 0540  NA 136 137  --  135 139 138  K 3.4* 3.9  --  3.5 3.9 3.4*  CL 99 103  --  97* 100 100  CO2 26 28   < > 28 28 27   GLUCOSE 90 97  --  105* 97 94  BUN 39* 35*  --  11 21 27*  CREATININE 4.95* 4.51*  --  2.22* 3.35* 3.88*  CALCIUM 9.7 9.6   < > 8.6* 9.7  10.1  PHOS 2.8 3.8  --  1.5*  --   --    < > = values in this interval not displayed.   Liver Function Tests: Recent Labs  Lab 10/09/19 1508 10/11/19 1402 10/14/19 1809  ALBUMIN 2.3* 2.2* 2.6*   No results for input(s): LIPASE, AMYLASE in the last 168 hours. CBC: Recent Labs  Lab 10/09/19 1507 10/11/19 1403 10/13/19 0900 10/13/19 1245 10/14/19 1809  WBC 11.5* 7.5  --  8.6 9.9  NEUTROABS  --  4.8  --   --   --   HGB 7.6* 7.4* 8.8* 9.0* 8.3*  HCT 25.4* 24.7* 26.0* 30.9* 27.7*  MCV 93.7 93.9  --  94.8 93.3  PLT 342 321  --  350 296    Blood Culture    Component Value Date/Time   SDES WOUND COCCYX 10/09/2019 1154   SPECREQUEST NONE 10/09/2019 1154   CULT  10/09/2019 1154    RARE SERRATIA MARCESCENS RARE BACTEROIDES FRAGILIS BETA LACTAMASE POSITIVE Performed at [place] [facility] [place] [address], [place], [place] [address]    REPTSTATUS 10/13/2019 FINAL 10/09/2019 1154    Cardiac Enzymes: No results for input(s): CKTOTAL, CKMB, CKMBINDEX, TROPONINI in the last 168 hours. CBG: No results for input(s): GLUCAP in the last 168 hours. Iron Studies:  Recent Labs    10/15/19 1102  IRON 30*  TIBC 158*   Lab Results  Component Value Date   INR 1.1 10/13/2019   INR 1.2 09/02/2019   INR 1.0 08/22/2019   Studies/Results: No results found. Medications: . sodium chloride    . sodium chloride    . cefTRIAXone (ROCEPHIN)  IV Stopped (10/16/19 4235)   . aspirin EC  81 mg Oral Daily  . atorvastatin  40 mg Oral q1800  . budesonide (PULMICORT) nebulizer solution  0.5 mg Nebulization BID  . chlorhexidine  15 mL Mouth/Throat BID  . Chlorhexidine Gluconate Cloth  6 each Topical Q0600  . darbepoetin (ARANESP) injection - DIALYSIS  150 mcg Intravenous Q Sat-HD  . diclofenac Sodium  2 g Topical QID  . feeding supplement (NEPRO CARB STEADY)  237 mL Oral TID BM  . feeding supplement (PRO-STAT SUGAR FREE 64)  30 mL Oral BID  . heparin  5,000 Units Subcutaneous Q8H  . hydrocerin   Topical TID  . magic mouthwash w/lidocaine  5 mL Oral QID  . metroNIDAZOLE  500 mg Oral Q8H  . multivitamin  1 tablet Oral QHS  . pantoprazole  40 mg Oral Q1200  . ramelteon  8 mg Oral QHS  . senna-docusate  2 tablet Oral BID  . sucralfate  1 g Oral Q6H

## 2019-10-16 NOTE — Progress Notes (Signed)
Speech Language Pathology Daily Session Note  Patient Details  Name: [name] MRN: [id number] Date of Birth: [birth year]  Today's Date: 10/16/2019 SLP Individual Time: 1120-1200 SLP Individual Time Calculation (min): 40 min  Short Term Goals: Week 2: SLP Short Term Goal 1 (Week 2): Patient will complete higher level reasoning and executive functioning tasks (ex: med and money management) with 90% accuracy and min cues. SLP Short Term Goal 2 (Week 2): Patient will state and demonstrate compensatory cognitive-linguistic strategies as trained with 90% accuracy and min cues. SLP Short Term Goal 3 (Week 2): Patient will complete delayed recall tasks for 5 stimuil post ~5 minute delay with 80% accuracy and min cues.  Skilled Therapeutic Interventions:  Skilled treatment session focused on cognition goals. SLP provided tasks similar to problem solving tasks within CLQT. Pt required Min A cues to preplan  tasks but then was able to execute task with greater accuracy and effecicieny after preplanning/organizing task. Education provided on how to preplan/organ and presequence tasks within functional tasks of walking and within every day task. Wife present and education provided. Both very appreciative.      Pain Pain Assessment Pain Scale: 0-10 Pain Score: 0-No pain  Therapy/Group: Individual Therapy  [name] 10/16/2019, 3:53 PM

## 2019-10-16 NOTE — Progress Notes (Signed)
PHYSICAL MEDICINE & REHABILITATION PROGRESS NOTE   Subjective/Complaints:  Appreciate neuropsych note , pt optimistic about recovery , hoping to avoid HD on long term basis   ROS: Denies CP, SOB, N/V/D  Objective:   No results found. Recent Labs    10/13/19 1245 10/14/19 1809  WBC 8.6 9.9  HGB 9.0* 8.3*  HCT 30.9* 27.7*  PLT 350 296   Recent Labs    10/15/19 1102 10/16/19 0540  NA 139 138  K 3.9 3.4*  CL 100 100  CO2 28 27  GLUCOSE 97 94  BUN 21 27*  CREATININE 3.35* 3.88*  CALCIUM 9.7 10.1    Intake/Output Summary (Last 24 hours) at 10/16/2019 0856 Last data filed at 10/16/2019 0217 Gross per 24 hour  Intake 420 ml  Output 425 ml  Net -5 ml     Physical Exam: Vital Signs Blood pressure (!) 153/84, pulse (!) 101, temperature 98.6 F (37 C), temperature source Oral, resp. rate 18, height 5\' 10"  (1.778 m), weight 81 kg, SpO2 98 %. Constitutional: No distress . Vital signs reviewed. HENT: Normocephalic.  Atraumatic. Eyes: EOMI. No discharge. Cardiovascular: No JVD. Respiratory: Normal effort.  No stridor. GI: Non-distended. Skin: Warm and dry.  Intact. Psych: Normal mood.  Normal behavior. Musc: No edema in extremities.  No tenderness in extremities. Neuro: Alert Dysphonia, unchanged Motor: RUE/RLE: 4/5 proximal distal LUE: Shoulder abduction, elbow flexion/extension 2/5, handgrip 0/5  LLE: Hip flexion, knee extension 3 -/5, ankle dorsiflexion 0/5  Assessment/Plan: 1. Functional deficits secondary to Left hemiparesis from RIght parietal infarct   which require 3+ hours per day of interdisciplinary therapy in a comprehensive inpatient rehab setting.  Physiatrist is providing close team supervision and 24 hour management of active medical problems listed below.  Physiatrist and rehab team continue to assess barriers to discharge/monitor patient progress toward functional and medical goals  Care Tool:  Bathing    Body parts bathed by  patient: Chest, Abdomen, Front perineal area, Right upper leg, Left upper leg, Face   Body parts bathed by helper: Buttocks, Right lower leg, Left lower leg     Bathing assist Assist Level: Maximal Assistance - Patient 24 - 49%     Upper Body Dressing/Undressing Upper body dressing   What is the patient wearing?: Pull over shirt    Upper body assist Assist Level: Maximal Assistance - Patient 25 - 49%    Lower Body Dressing/Undressing Lower body dressing      What is the patient wearing?: Pants, Incontinence brief     Lower body assist Assist for lower body dressing: Total Assistance - Patient < 25%     Toileting Toileting Toileting Activity did not occur (Clothing management and hygiene only): N/A (no void or bm)  Toileting assist Assist for toileting: Total Assistance - Patient < 25% Assistive Device Comment: urinal    Transfers Chair/bed transfer  Transfers assist  Chair/bed transfer activity did not occur: Safety/medical concerns  Chair/bed transfer assist level: 2 Helpers(sliding board)     Locomotion Ambulation   Ambulation assist   Ambulation activity did not occur: Safety/medical concerns          Walk 10 feet activity   Assist  Walk 10 feet activity did not occur: Safety/medical concerns        Walk 50 feet activity   Assist Walk 50 feet with 2 turns activity did not occur: Safety/medical concerns         Walk 150 feet activity  Assist Walk 150 feet activity did not occur: Safety/medical concerns         Walk 10 feet on uneven surface  activity   Assist Walk 10 feet on uneven surfaces activity did not occur: Safety/medical concerns         Wheelchair     Assist   Type of Wheelchair: Manual    Wheelchair assist level: Dependent - Patient 0%(TIS WC) Max wheelchair distance: 150    Wheelchair 50 feet with 2 turns activity    Assist        Assist Level: Dependent - Patient 0%   Wheelchair 150 feet  activity     Assist      Assist Level: Dependent - Patient 0%   Blood pressure (!) 153/84, pulse (!) 101, temperature 98.6 F (37 C), temperature source Oral, resp. rate 18, height 5\' 10"  (1.778 m), weight 81 kg, SpO2 98 %.  Medical Problem List and Plan: 1.  Debility secondary to acute hypoxemic RIght parietal infarct with Left hemiparesis LE>UE   Continue CIR- PT, OT, SLP, extended stay by 7 d given potential to exceed original goals and reduce care needs  2.  Antithrombotics: -DVT/anticoagulation: Subcutaneous heparin. Negative  vascular study             -antiplatelet therapy: Aspirin 81 mg daily 3. Pain Management: Hycet as needed, Right great toe pain,improved cont  prevalon boots    Relatively controlled on 12/24 4. Mood: Provide emotional support. Rozerem 8 mg nightly             -antipsychotic agents: N/A 5. Neuropsych: This patient is capable of making decisions on his own behalf.             SLP recommends additional diagnostics of higher level cognitive skills.  6. Skin/Wound Care: Wound care sacral decubitus with tunneling.   -pack Aquacel Ag+ twice daily for WOC  -woc rn consulted- apreciate surgery input, no I and D recommended discussed with Pharmacy, expanding antibiotic coverage to include bacteroides fragilis  -nutrition, turning, prevalon boots for heels- needs order to place qhs  7. Fluids/Electrolytes/Nutrition: Routine in and outs 8.  Anemia/melena.  Continue Aranesp  Hgb 8.3 on 12/22 9.  Tracheostomy 08/22/2019.  Decannulated.  Diet advanced to regular.  Continue nebulizers as directed 10.  MSSA bacteremia/pneumonia.  Antibiotic therapy completed 11.  Hyperlipidemia.  Lipitor 12.  Post stroke oropharyngeal dysphagia: Appreciate speech therapy recommendations; regular diet with oral care BID  -MMW for thrush 13.  Renal failure after sepsis- on HD per Nephro - tent d/c 1/7 will need to make plans for OP HD- creat improving   First stage left basilic vein  transposition 12/21 14.  Leukocytosis: Resolved  WBCs 7.5 on 12/19  Afebrile- does have serratia marcesens growing from wound S to ceftriaxone, Bacteroides growing out as well but no susceptibility reported as rec per Pharm, adding metronidazole for anaerobic coverage   Continue to monitor 15.  Sleep disturbance no current c/os  Melatonin started on 12/20  LOS: 13 days A FACE TO [place] E Tajanae Guilbault 10/16/2019, 8:56 AM

## 2019-10-17 MED ORDER — CHLORHEXIDINE GLUCONATE CLOTH 2 % EX PADS
6.0000 | MEDICATED_PAD | Freq: Two times a day (BID) | CUTANEOUS | Status: DC
  Administered 2019-10-17 – 2019-10-29 (×21): 6 via TOPICAL

## 2019-10-17 NOTE — Progress Notes (Signed)
[place] PHYSICAL MEDICINE & REHABILITATION PROGRESS NOTE   Subjective/Complaints:  Took steps max A with PT yesterday  Constipated today Discussed sitting in chair as well as sidelying to help with pressure relief on sacral wound  ROS: Denies CP, SOB, N/V/D  Objective:   No results found. Recent Labs    10/14/19 1809 10/16/19 1316  WBC 9.9 7.4  HGB 8.3* 8.0*  HCT 27.7* 27.2*  PLT 296 356   Recent Labs    10/15/19 1102 10/16/19 0540  NA 139 138  K 3.9 3.4*  CL 100 100  CO2 28 27  GLUCOSE 97 94  BUN 21 27*  CREATININE 3.35* 3.88*  CALCIUM 9.7 10.1    Intake/Output Summary (Last 24 hours) at 10/17/2019 0837 Last data filed at 10/16/2019 2348 Gross per 24 hour  Intake 120 ml  Output 1750 ml  Net -1630 ml     Physical Exam: Vital Signs Blood pressure 130/83, pulse (!) 101, temperature 98.5 F (36.9 C), resp. rate 20, height 5\' 10"  (1.778 m), weight 81 kg, SpO2 100 %. Constitutional: No distress . Vital signs reviewed. HENT: Normocephalic.  Atraumatic. Eyes: EOMI. No discharge. Cardiovascular: No JVD. Respiratory: Normal effort.  No stridor. GI: Non-distended. Skin: Warm and dry.  Intact. Psych: Normal mood.  Normal behavior. Musc: No edema in extremities.  No tenderness in extremities. Neuro: Alert Dysphonia, unchanged Motor: RUE/RLE: 4/5 proximal distal LUE: Shoulder abduction, elbow flexion/extension 2/5, handgrip 0/5  LLE: Hip flexion, knee extension 3 -/5, ankle dorsiflexion 0/5  Assessment/Plan: 1. Functional deficits secondary to Left hemiparesis from RIght parietal infarct   which require 3+ hours per day of interdisciplinary therapy in a comprehensive inpatient rehab setting.  Physiatrist is providing close team supervision and 24 hour management of active medical problems listed below.  Physiatrist and rehab team continue to assess barriers to discharge/monitor patient progress toward functional and medical goals  Care Tool:  Bathing     Body parts bathed by patient: Chest, Abdomen, Front perineal area, Right upper leg, Left upper leg, Face   Body parts bathed by helper: Buttocks, Right lower leg, Left lower leg     Bathing assist Assist Level: Maximal Assistance - Patient 24 - 49%     Upper Body Dressing/Undressing Upper body dressing   What is the patient wearing?: Pull over shirt    Upper body assist Assist Level: Maximal Assistance - Patient 25 - 49%    Lower Body Dressing/Undressing Lower body dressing      What is the patient wearing?: Pants, Incontinence brief     Lower body assist Assist for lower body dressing: Total Assistance - Patient < 25%     Toileting Toileting Toileting Activity did not occur (Clothing management and hygiene only): N/A (no void or bm)  Toileting assist Assist for toileting: Total Assistance - Patient < 25% Assistive Device Comment: urinal    Transfers Chair/bed transfer  Transfers assist  Chair/bed transfer activity did not occur: Safety/medical concerns  Chair/bed transfer assist level: Maximal Assistance - Patient 25 - 49%     Locomotion Ambulation   Ambulation assist   Ambulation activity did not occur: Safety/medical concerns  Assist level: 2 helpers Assistive device: Other (comment)(rail in hall) Max distance: 4   Walk 10 feet activity   Assist  Walk 10 feet activity did not occur: Safety/medical concerns        Walk 50 feet activity   Assist Walk 50 feet with 2 turns activity did not occur: Safety/medical  concerns         Walk 150 feet activity   Assist Walk 150 feet activity did not occur: Safety/medical concerns         Walk 10 feet on uneven surface  activity   Assist Walk 10 feet on uneven surfaces activity did not occur: Safety/medical concerns         Wheelchair     Assist   Type of Wheelchair: Manual    Wheelchair assist level: Minimal Assistance - Patient > 75% Max wheelchair distance: 100     Wheelchair 50 feet with 2 turns activity    Assist        Assist Level: Minimal Assistance - Patient > 75%   Wheelchair 150 feet activity     Assist      Assist Level: Dependent - Patient 0%   Blood pressure 130/83, pulse (!) 101, temperature 98.5 F (36.9 C), resp. rate 20, height 5\' 10"  (1.778 m), weight 81 kg, SpO2 100 %.  Medical Problem List and Plan: 1.  Debility secondary to acute hypoxemic RIght parietal infarct with Left hemiparesis LE>UE   Continue CIR- PT, OT, SLP, extended stay by 7 d given potential to exceed original goals and reduce care needs  2.  Antithrombotics: -DVT/anticoagulation: Subcutaneous heparin. Negative  vascular study             -antiplatelet therapy: Aspirin 81 mg daily 3. Pain Management: Hycet as needed,some sacral pain but not severe   Relatively controlled on 12/24 4. Mood: Provide emotional support. Rozerem 8 mg nightly             -antipsychotic agents: N/A 5. Neuropsych: This patient is capable of making decisions on his own behalf.             SLP recommends additional diagnostics of higher level cognitive skills.  6. Skin/Wound Care: Wound care sacral decubitus with tunneling. , cont mattress overlay, sidelying recommended   -pack Aquacel Ag+ twice daily for WOC  -woc rn consulted- apreciate surgery input, no I and D recommended discussed with Pharmacy, expanding antibiotic coverage to include bacteroides fragilis  -nutrition, turning, prevalon boots for heels- needs order to place qhs  7. Fluids/Electrolytes/Nutrition: Routine in and outs 8.  Anemia/melena.  Continue Aranesp  Hgb 8.3 on 12/22 9.  Tracheostomy 08/22/2019.  Decannulated.  Diet advanced to regular.  Continue nebulizers as directed 10.  MSSA bacteremia/pneumonia.  Antibiotic therapy completed 11.  Hyperlipidemia.  Lipitor 12.  Post stroke oropharyngeal dysphagia: Appreciate speech therapy recommendations; regular diet with oral care BID  -MMW for thrush 13.   Renal failure after sepsis- on HD per Nephro - tent d/c 1/7 will need to make plans for OP HD- creat improving   First stage left basilic vein transposition 12/21 14.  Infected sacral decub- no I and D per gen surg   WBCs 7.4 on 12/24  Afebrile- does have serratia marcesens growing from wound S to ceftriaxone, Bacteroides growing out as well but no susceptibility reported as rec per Pharm, adding metronidazole for anaerobic coverage   Continue to monitor 15.  Sleep disturbance no current c/os  Melatonin started on 12/20  LOS: 14 days A FACE TO [place] E Suly Vukelich 10/17/2019, 8:37 AM

## 2019-10-18 ENCOUNTER — Inpatient Hospital Stay (HOSPITAL_COMMUNITY): Payer: Medicare HMO | Admitting: Speech Pathology

## 2019-10-18 ENCOUNTER — Inpatient Hospital Stay (HOSPITAL_COMMUNITY): Payer: Medicare HMO | Admitting: Occupational Therapy

## 2019-10-18 LAB — RENAL FUNCTION PANEL
Albumin: 2.9 g/dL — ABNORMAL LOW (ref 3.5–5.0)
Anion gap: 11 (ref 5–15)
BUN: 26 mg/dL — ABNORMAL HIGH (ref 8–23)
CO2: 26 mmol/L (ref 22–32)
Calcium: 9.9 mg/dL (ref 8.9–10.3)
Chloride: 94 mmol/L — ABNORMAL LOW (ref 98–111)
Creatinine, Ser: 3.5 mg/dL — ABNORMAL HIGH (ref 0.61–1.24)
GFR calc Af Amer: 20 mL/min — ABNORMAL LOW (ref >60)
GFR calc non Af Amer: 17 mL/min — ABNORMAL LOW (ref >60)
Glucose, Bld: 112 mg/dL — ABNORMAL HIGH (ref 70–99)
Phosphorus: 3.6 mg/dL (ref 2.5–4.6)
Potassium: 3.7 mmol/L (ref 3.5–5.1)
Sodium: 131 mmol/L — ABNORMAL LOW (ref 135–145)

## 2019-10-18 MED ORDER — POLYETHYLENE GLYCOL 3350 17 G PO PACK
17.0000 g | PACK | Freq: Every day | ORAL | Status: DC
  Administered 2019-10-19 – 2019-10-20 (×2): 17 g via ORAL
  Filled 2019-10-18 (×2): qty 1

## 2019-10-18 MED ORDER — POLYETHYLENE GLYCOL 3350 17 G PO PACK: 17.0000 g | PACK | Freq: Every day | ORAL | Status: DC

## 2019-10-18 NOTE — Progress Notes (Signed)
[place] PHYSICAL MEDICINE & REHABILITATION PROGRESS NOTE   Subjective/Complaints: Constipated; agreeable to increasing stool softeners. Has not had BM in 4 days. Pain well controlled. Working with therapy. Sleeping well at night.  K+ improved.   ROS: Denies CP, SOB, N/V/D  Objective:   No results found. Recent Labs    10/16/19 1316  WBC 7.4  HGB 8.0*  HCT 27.2*  PLT 356   Recent Labs    10/16/19 0540 10/18/19 1138  NA 138 131*  K 3.4* 3.7  CL 100 94*  CO2 27 26  GLUCOSE 94 112*  BUN 27* 26*  CREATININE 3.88* 3.50*  CALCIUM 10.1 9.9    Intake/Output Summary (Last 24 hours) at 10/18/2019 1352 Last data filed at 10/18/2019 1345 Gross per 24 hour  Intake 100 ml  Output 500 ml  Net -400 ml     Physical Exam: Vital Signs Blood pressure 140/78, pulse (!) 105, temperature 98.5 F (36.9 C), temperature source Oral, resp. rate 20, height 5\' 10"  (1.778 m), weight 81 kg, SpO2 96 %. Constitutional: No distress . Vital signs reviewed. HENT: Normocephalic.  Atraumatic. Eyes: EOMI. No discharge. Cardiovascular: No JVD. Respiratory: Normal effort.  No stridor. GI: Non-distended. Skin: Warm and dry.  Intact. Psych: Normal mood.  Normal behavior. Musc: No edema in extremities.  No tenderness in extremities. Neuro: Alert Dysphonia, unchanged Motor: RUE/RLE: 4/5 proximal distal LUE: Shoulder abduction, elbow flexion/extension 2/5, handgrip 0/5  LLE: Hip flexion, knee extension 3 -/5, ankle dorsiflexion 0/5  Assessment/Plan: 1. Functional deficits secondary to Left hemiparesis from RIght parietal infarct   which require 3+ hours per day of interdisciplinary therapy in a comprehensive inpatient rehab setting.  Physiatrist is providing close team supervision and 24 hour management of active medical problems listed below.  Physiatrist and rehab team continue to assess barriers to discharge/monitor patient progress toward functional and medical goals  Care  Tool:  Bathing    Body parts bathed by patient: Chest, Abdomen, Front perineal area, Right upper leg, Left upper leg, Face   Body parts bathed by helper: Buttocks, Right lower leg, Left lower leg     Bathing assist Assist Level: Maximal Assistance - Patient 24 - 49%     Upper Body Dressing/Undressing Upper body dressing   What is the patient wearing?: Pull over shirt    Upper body assist Assist Level: Maximal Assistance - Patient 25 - 49%    Lower Body Dressing/Undressing Lower body dressing      What is the patient wearing?: Pants     Lower body assist Assist for lower body dressing: 2 Helpers(sit<stand)     Toileting Toileting Toileting Activity did not occur (Clothing management and hygiene only): N/A (no void or bm)  Toileting assist Assist for toileting: Total Assistance - Patient < 25% Assistive Device Comment: urinal    Transfers Chair/bed transfer  Transfers assist  Chair/bed transfer activity did not occur: Safety/medical concerns  Chair/bed transfer assist level: Maximal Assistance - Patient 25 - 49%     Locomotion Ambulation   Ambulation assist   Ambulation activity did not occur: Safety/medical concerns  Assist level: 2 helpers Assistive device: Other (comment)(rail in hall) Max distance: 4   Walk 10 feet activity   Assist  Walk 10 feet activity did not occur: Safety/medical concerns        Walk 50 feet activity   Assist Walk 50 feet with 2 turns activity did not occur: Safety/medical concerns  Walk 150 feet activity   Assist Walk 150 feet activity did not occur: Safety/medical concerns         Walk 10 feet on uneven surface  activity   Assist Walk 10 feet on uneven surfaces activity did not occur: Safety/medical concerns         Wheelchair     Assist   Type of Wheelchair: Manual    Wheelchair assist level: Minimal Assistance - Patient > 75% Max wheelchair distance: 100    Wheelchair 50 feet  with 2 turns activity    Assist        Assist Level: Minimal Assistance - Patient > 75%   Wheelchair 150 feet activity     Assist      Assist Level: Dependent - Patient 0%   Blood pressure 140/78, pulse (!) 105, temperature 98.5 F (36.9 C), temperature source Oral, resp. rate 20, height 5\' 10"  (1.778 m), weight 81 kg, SpO2 96 %.  Medical Problem List and Plan: 1.  Debility secondary to acute hypoxemic RIght parietal infarct with Left hemiparesis LE>UE   Continue CIR- PT, OT, SLP, extended stay by 7 d given potential to exceed original goals and reduce care needs  2.  Antithrombotics: -DVT/anticoagulation: Subcutaneous heparin. Negative  vascular study             -antiplatelet therapy: Aspirin 81 mg daily 3. Pain Management: Hycet as needed,some sacral pain but not severe   Relatively controlled on 12/24 4. Mood: Provide emotional support. Rozerem 8 mg nightly             -antipsychotic agents: N/A 5. Neuropsych: This patient is capable of making decisions on his own behalf.             SLP recommends additional diagnostics of higher level cognitive skills.  6. Skin/Wound Care: Wound care sacral decubitus with tunneling. , cont mattress overlay, sidelying recommended   -pack Aquacel Ag+ twice daily for WOC  -woc rn consulted- apreciate surgery input, no I and D recommended discussed with Pharmacy, expanding antibiotic coverage to include bacteroides fragilis  -nutrition, turning, prevalon boots for heels- needs order to place qhs  7. Fluids/Electrolytes/Nutrition: Routine in and outs 8.  Anemia/melena.  Continue Aranesp  Hgb 8.3 on 12/22 9.  Tracheostomy 08/22/2019.  Decannulated.  Diet advanced to regular.  Continue nebulizers as directed 10.  MSSA bacteremia/pneumonia.  Antibiotic therapy completed 11.  Hyperlipidemia.  Lipitor 12.  Post stroke oropharyngeal dysphagia: Appreciate speech therapy recommendations; regular diet with oral care BID  -MMW for thrush 13.   Renal failure after sepsis- on HD per Nephro - tent d/c 1/7 will need to make plans for OP HD- creat improving   First stage left basilic vein transposition 12/21 14.  Infected sacral decub- no I and D per gen surg   WBCs 7.4 on 12/24  Afebrile- does have serratia marcesens growing from wound S to ceftriaxone, Bacteroides growing out as well but no susceptibility reported as rec per Pharm, adding metronidazole for anaerobic coverage   Continue to monitor 15.  Sleep disturbance no current c/os  Melatonin started on 12/20 16. Constipation:  12/26: Continue senna-docusate BID. Made Miralax standing.   LOS: 15 days A FACE TO FACE EVALUATION WAS PERFORMED  Martha Clan P Jlyn Bracamonte 10/18/2019, 1:52 PM

## 2019-10-18 NOTE — Progress Notes (Signed)
   10/18/19 1546  MEWS Assessment  Is this an acute change? No   Pt HR is at baseline, asymptomatic, resting in bed. Will cont to monitor.  Erie Noe, RN

## 2019-10-18 NOTE — Progress Notes (Signed)
Occupational Therapy Session Note  Patient Details  Name: [name] MRN: [id number] Date of Birth: [birth year]  Today's Date: 10/18/2019 OT Individual Time: 0901-1003 OT Individual Time Calculation (min): 62 min   Short Term Goals: Week 2:  OT Short Term Goal 1 (Week 2): Pt will demonstrate carryover of 1 self ROM exercise with mod vcs OT Short Term Goal 2 (Week 2): Pt will complete BSC transfer with 2 assist for OOB toileting OT Short Term Goal 3 (Week 2): Pt will complete 1 self care task EOB with no more than Mod A for balance  Skilled Therapeutic Interventions/Progress Updates:    Pt greeted in bed with no c/o pain. Agreeable to session. He already washed LB, including peri-areas with RN staff this AM. Wanting to put on his pants and spot wash UB at the sink. Supine<sit completed with Max A of 1. While sitting EOB, worked on pt weight shifting forward due to posterior lean/LOB. He was able to maintain balance for short windows of time with steady assist and heavy reliance of R UE to pull himself forward. Mod A for balance otherwise with facilitation for anterior weight shift. At this time, 2nd helper threaded LEs into pants. +2 for sit<stand in [place] to elevate pants. Pt still with strong posterior lean. He was unable to maintain Lt hand grip on [place] bar unassisted. After transferring to TIS, pt was positioned in neutral at the sink. HOH to incorporate Lt functionally. Provided built up adaptation to his toothbrush to use Lt hand while brushing teeth. With Lt elbow propped on sink, he used his Rt hand to actively assist Lt with Min A from therapist to manipulate brush in hand. Mod facilitation when reaching for faucet levers and soap dispenser with Lt. Worked on bilateral integration when squeezing out wash cloths to strengthen Lt hand grasp. HOH to wash Rt side with Lt as well. He required Max A to don overhead shirt using hemi techniques though pt was able to weight shift towards sink with  supervision. When he was finished with self care, worked on sit<stands in [place], focusing on improved hip extension in standing. Pt needing +2 for sit<stands x2 reps, however with much improved hip extension so [place] paddles could be safely lowered. Pt once again needed A to maintain Lt hand grip on the bar. At end of session pt was reclined in TIS for pressure relief. Lt arm supported, safety belt fastened, and all needs within reach.   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 Per pt, pain manageable for tx. He declined use of hot modality, as recommended by MD, at end of session  Pain Assessment Pain Scale: 0-10 Pain Score: 0-No pain ADL: ADL Eating: Maximal assistance(for sitting balance) Where Assessed-Eating: Edge of bed Grooming: Maximal assistance Where Assessed-Grooming: Edge of bed Upper Body Bathing: Maximal assistance Where Assessed-Upper Body Bathing: Edge of bed Lower Body Bathing: Dependent(+2 assist) Where Assessed-Lower Body Bathing: Bed level Upper Body Dressing: Dependent Where Assessed-Upper Body Dressing: Edge of bed Lower Body Dressing: Dependent(+2 assist) Where Assessed-Lower Body Dressing: Bed level Toileting: Not assessed Toilet Transfer: Not assessed Tub/Shower Transfer: Not assessed      Therapy/Group: Individual Therapy  Joellen Tullos A Demetres Prochnow 10/18/2019, 12:41 PM

## 2019-10-18 NOTE — Plan of Care (Signed)
  Problem: Consults Goal: RH STROKE PATIENT EDUCATION Description: See Patient Education module for education specifics  Outcome: Progressing Goal: Nutrition Consult-if indicated Outcome: Progressing   Problem: RH BOWEL ELIMINATION Goal: RH STG MANAGE BOWEL WITH ASSISTANCE Description: STG Manage Bowel with min/mod Assistance. Outcome: Progressing Goal: RH STG MANAGE BOWEL W/MEDICATION W/ASSISTANCE Description: STG Manage Bowel with Medication with mod I Assistance. Outcome: Progressing   Problem: RH BLADDER ELIMINATION Goal: RH STG MANAGE BLADDER WITH ASSISTANCE Description: STG Manage Bladder With min/mod Assistance Outcome: Progressing   Problem: RH SKIN INTEGRITY Goal: RH STG SKIN FREE OF INFECTION/BREAKDOWN Description: Patients skin will remain free from further infection or breakdown with mod assist. Outcome: Progressing Goal: RH STG MAINTAIN SKIN INTEGRITY WITH ASSISTANCE Description: STG Maintain Skin Integrity With mod Assistance. Outcome: Progressing Goal: RH STG ABLE TO PERFORM INCISION/WOUND CARE W/ASSISTANCE Description: STG Able To Perform Incision/Wound Care With total Assistance from caregiver. Outcome: Progressing   Problem: RH SAFETY Goal: RH STG ADHERE TO SAFETY PRECAUTIONS W/ASSISTANCE/DEVICE Description: STG Adhere to Safety Precautions With supervision Assistance/Device. Outcome: Progressing   Problem: RH PAIN MANAGEMENT Goal: RH STG PAIN MANAGED AT OR BELOW PT'S PAIN GOAL Description: < 4 Outcome: Progressing   Problem: RH KNOWLEDGE DEFICIT Goal: RH STG INCREASE KNOWLEDGE OF HYPERTENSION Description: Patient/caregiver will verbalize understanding of management of HTN including diet, medications, monitoring, exercise, and follow up appointments with min assist. Outcome: Progressing Goal: RH STG INCREASE KNOWLEGDE OF HYPERLIPIDEMIA Description: Patient/caregiver will verbalize understanding of management of HLD including diet, medications,  monitoring, exercise, and follow up appointments with min assist. Outcome: Progressing Goal: RH STG INCREASE KNOWLEDGE OF STROKE PROPHYLAXIS Description: Patient/caregiver will verbalize understanding of management of stroke prophylaxis including diet, medications, monitoring, exercise, and follow up appointments with min assist. Outcome: Progressing

## 2019-10-18 NOTE — Progress Notes (Signed)
[facility] Progress Note  5 Days Post-Op  Subjective: CC-  No complaints. Tolerating dressing changes well. For some reason his wound has been packed with silver alginate rather than packing strips for the last 2 days. Per RN drainage had improved until the last 2 days.  Objective: Vital signs in last 24 hours: Temp:  [97.8 F (36.6 C)-98.9 F (37.2 C)] 98.5 F (36.9 C) (12/26 0449) Pulse Rate:  [105-110] 105 (12/26 0826) Resp:  [16-20] 20 (12/26 0449) BP: (119-140)/(70-79) 140/78 (12/26 0826) SpO2:  [96 %-100 %] 96 % (12/26 0449) Last BM Date: 10/17/19  Intake/Output from previous day: 12/25 0701 - 12/26 0700 In: 600 [P.O.:600] Out: 600 [Urine:600] Intake/Output this shift: No intake/output data recorded.  PE: Gen:  Alert, NAD, pleasant HEENT: EOM's intact, pupils equal and round Pulm: rate and effort normal Abd: Soft, NT/ND Psych: A&Ox3  Msk: no BLE edema Skin: warm and dry GU: Sacral wound measuring 2x1x4cm with significant tunneling, drainage noted on packing that was removed, no induration, cellulitis, or tenderness  Lab Results:  Recent Labs    10/16/19 1316  WBC 7.4  HGB 8.0*  HCT 27.2*  PLT 356   BMET Recent Labs    10/15/19 1102 10/16/19 0540  NA 139 138  K 3.9 3.4*  CL 100 100  CO2 28 27  GLUCOSE 97 94  BUN 21 27*  CREATININE 3.35* 3.88*  CALCIUM 9.7 10.1   PT/INR No results for input(s): LABPROT, INR in the last 72 hours. CMP     Component Value Date/Time   NA 138 10/16/2019 0540   NA 143 11/08/2017 1030   K 3.4 (L) 10/16/2019 0540   CL 100 10/16/2019 0540   CO2 27 10/16/2019 0540   GLUCOSE 94 10/16/2019 0540   BUN 27 (H) 10/16/2019 0540   BUN 8 11/08/2017 1030   CREATININE 3.88 (H) 10/16/2019 0540   CREATININE 1.15 11/20/2014 1224   CALCIUM 10.1 10/16/2019 0540   CALCIUM 9.2 10/07/2019 1422   PROT 6.6 09/10/2019 0431   PROT 7.2 11/08/2017 1030   ALBUMIN 2.6 (L) 10/14/2019 1809   ALBUMIN 4.6 11/08/2017 1030   AST 35 09/10/2019 0431   ALT 6 09/10/2019 0431   ALKPHOS 73 09/10/2019 0431   BILITOT 0.4 09/10/2019 0431   BILITOT 0.4 11/08/2017 1030   GFRNONAA 15 (L) 10/16/2019 0540   GFRAA 17 (L) 10/16/2019 0540   Lipase  No results found for: LIPASE     Studies/Results: No results found.  Anti-infectives: Anti-infectives (From admission, onward)   Start     Dose/Rate Route Frequency Ordered Stop   10/15/19 0200  metroNIDAZOLE (FLAGYL) tablet 500 mg     500 mg Oral Every 8 hours 10/14/19 1953     10/14/19 1400  metroNIDAZOLE (FLAGYL) tablet 500 mg  Status:  Discontinued     500 mg Oral Every 8 hours 10/14/19 1121 10/14/19 1953   10/14/19 0730  cefTRIAXone (ROCEPHIN) 1 g in sodium chloride 0.9 % 100 mL IVPB     1 g 200 mL/hr over 30 Minutes Intravenous Every 24 hours 10/14/19 0726     10/13/19 0600  cefUROXime (ZINACEF) 1.5 g in sodium chloride 0.9 % 100 mL IVPB     1.5 g 200 mL/hr over 30 Minutes Intravenous On call to O.R. 10/12/19 0956 10/14/19 0559       Assessment/Plan HxCOVID (9/20) Tracheostomy tube 10/30s/p decannulation Atrial fibrillation CVA ARFon HD   Sacral decubitus - No current indication for surgical  debridement.  - Do not use silver alginate any more. Continue TID dressing changes and pack wound with iodoform packing strips. Ok to shower with wound open. Will see again Monday.  FEN -Reg VTE -SCDs, ASA, subcutaneous heparin ID -Rocephin/flagyl per primary team, wound cx w/serratia marcescens.    LOS: 15 days    [facility] 10/18/2019, 8:47 AM Please see Amion for pager number during day hours 7:00am-4:30pm

## 2019-10-18 NOTE — Progress Notes (Signed)
Speech Language Pathology Weekly Progress and Session Note  Patient Details  Name: [name] MRN: [id number] Date of Birth: Mar 09, 1951  Beginning of progress report period: October 08, 2019 End of progress report period: October 18, 2019  Today's Date: 10/18/2019 SLP Individual Time: 1105-1203 SLP Individual Time Calculation (min): 58 min  Short Term Goals: Week 2: SLP Short Term Goal 1 (Week 2): Patient will complete higher level reasoning and executive functioning tasks (ex: med and money management) with 90% accuracy and min cues. SLP Short Term Goal 1 - Progress (Week 2): Not met SLP Short Term Goal 2 (Week 2): Patient will state and demonstrate compensatory cognitive-linguistic strategies as trained with 90% accuracy and min cues. SLP Short Term Goal 2 - Progress (Week 2): Not met SLP Short Term Goal 3 (Week 2): Patient will complete delayed recall tasks for 5 stimuil post ~5 minute delay with 80% accuracy and min cues. SLP Short Term Goal 3 - Progress (Week 2): Met    New Short Term Goals: Week 3: SLP Short Term Goal 1 (Week 3): Patient will complete higher level reasoning and executive functioning tasks with 90% accuracy and min cues. SLP Short Term Goal 2 (Week 3): Patient will state and demonstrate compensatory cognitive-linguistic strategies as trained with 90% accuracy and min cues. SLP Short Term Goal 3 (Week 3): Patient will complete delayed recall tasks for 5 stimuil post ~5 minute delay with 80% accuracy and supervision A verbal cues.  Weekly Progress Updates: Pt met 1 out 3 goals this reporting period. SLP added complex problem solving goal focusing on executive function as well as use of recall strategies within task. Pt demonstrated moderate impairments in executive function and memory on CLQT. Pt demonstrated improvements in delayed recall with WRAP strategy and overall increase of recall pertaining to day to day events. Pt would benefit from skilled ST services in  order to maximize functional independence and reduce burden of care, likely requiring 24 hour supervision and continue ST services.     Intensity: Minumum of 1-2 x/day, 30 to 90 minutes Frequency: 3 to 5 out of 7 days Duration/Length of Stay: 1/15 Treatment/Interventions: Cognitive remediation/compensation;Cueing hierarchy;Functional tasks;Therapeutic Exercise;Patient/family education;Medication managment   Daily Session  Skilled Therapeutic Interventions:  Skilled ST services focused on cognitive skills. SLP facilitated recall of 5 novel words, utilizing WRAP, pt wrote down words, repeated and visualized words, pt demonstrated recall of 2 out 5 words (5/5 with category and choice cues) and on second trial 5 out 5 words with 5 minute delay. Pt demonstrated recall of 3 out 5 words with 10 minute delay. Pt demonstrated recall of some executive function strategies, "think before you act" and general tasks from last ST session, pt required mod A verbal cues to utilize strategies within complex task (reading information to understand whole picture, focusing on salient information first and notetaking.) SLP also facilitated use of executive function strategies in complex scheduling task, pt required assistance on 3 out 7 parts within task but overall mod A verbal cues, further impacted by reduced working memory. Pt was left in room with call bell within reach and chair alarm set. ST recommends to continue skilled ST services.     General    Pain Pain Assessment Pain Scale: 0-10 Pain Score: 0-No pain  Therapy/Group: Individual Therapy  [name]  [facility] 10/18/2019, 12:21 PM

## 2019-10-18 NOTE — Progress Notes (Signed)
Patient ID: [name], male   DOB: 04-Oct-1951, 68 y.o.   MRN: [id number]  [facility] Progress Note   Assessment/ Plan:   1.  COVID-19 pneumonia with hypoxic respiratory failure and prolonged hospitalization/complications: Status post decannulation of tracheostomy and removal of chest tube with transfer to CIR around 2 weeks ago following which he has been making gradual progress with rehabilitation.  Without oxygen supplementation. 2. ESRD: From prolonged dialysis dependent acute kidney injury and recently on a TTS dialysis schedule, some optimism raised by improving urine output following discontinuation of Foley catheter.  I will check labs today to determine need for dialysis tomorrow versus Monday.  Will order daily labs. 3. Anemia: Without overt loss, continue Aranesp for anemia of critical illness.  With iron deficiency getting intravenous iron. 4. CKD-MBD: With borderline elevated calcium levels when corrected for albumin, low phosphorus.  Will need to discontinue sucralfate as soon as possible to limit aluminum toxicity. 5. Nutrition: Continue current renal diet with oral protein supplementation to promote healing of sacral decubitus. 6. Hypertension: Euvolemic on physical exam, blood pressures acceptable. 7.  Sacral decubitus: Ongoing aggressive wound management by wound care/surgery.  On oral metronidazole.  Subjective:   Reports to be feeling fair, continues to make some progress with physical therapy and optimistic for renal recovery.   Objective:   BP 140/78 (BP Location: Right Wrist)   Pulse (!) 105   Temp 98.5 F (36.9 C) (Oral)   Resp 20   Ht 5\' 10"  (1.778 m)   Wt 81 kg   SpO2 96%   BMI 25.62 kg/m   Physical Exam: Gen: Performing self-care well sitting in a wheelchair CVS: Pulse regular tachycardia, S1 and S2 normal Resp: Clear to auscultation bilaterally, no rales/rhonchi Abd: Soft, obese, nontender Ext: No lower extremity  edema  Labs: BMET Recent Labs  Lab 10/11/19 1402 10/13/19 0900 10/13/19 1245 10/14/19 1809 10/15/19 1102 10/16/19 0540  NA 137 135 138 135 139 138  K 3.9 4.0 4.4 3.5 3.9 3.4*  CL 103 101 102 97* 100 100  CO2 28  --  27 28 28 27   GLUCOSE 97 94 114* 105* 97 94  BUN 35* 28* 30* 11 21 27*  CREATININE 4.51* 3.80* 4.13* 2.22* 3.35* 3.88*  CALCIUM 9.6  --  10.3 8.6* 9.7 10.1  PHOS 3.8  --   --  1.5*  --   --    CBC Recent Labs  Lab 10/11/19 1403 10/13/19 0900 10/13/19 1245 10/14/19 1809 10/16/19 1316  WBC 7.5  --  8.6 9.9 7.4  NEUTROABS 4.8  --   --   --   --   HGB 7.4* 8.8* 9.0* 8.3* 8.0*  HCT 24.7* 26.0* 30.9* 27.7* 27.2*  MCV 93.9  --  94.8 93.3 95.4  PLT 321  --  350 296 356   Medications:    . aspirin EC  81 mg Oral Daily  . atorvastatin  40 mg Oral q1800  . budesonide (PULMICORT) nebulizer solution  0.5 mg Nebulization BID  . chlorhexidine  15 mL Mouth/Throat BID  . Chlorhexidine Gluconate Cloth  6 each Topical BID  . darbepoetin (ARANESP) injection - DIALYSIS  150 mcg Intravenous Q Sat-HD  . diclofenac Sodium  2 g Topical QID  . feeding supplement (NEPRO CARB STEADY)  237 mL Oral TID BM  . feeding supplement (PRO-STAT SUGAR FREE 64)  30 mL Oral BID  . heparin  5,000 Units Subcutaneous Q8H  . hydrocerin   Topical  TID  . magic mouthwash w/lidocaine  5 mL Oral QID  . metroNIDAZOLE  500 mg Oral Q8H  . multivitamin  1 tablet Oral QHS  . pantoprazole  40 mg Oral Q1200  . ramelteon  8 mg Oral QHS  . senna-docusate  2 tablet Oral BID  . sucralfate  1 g Oral Q6H   Elmarie Shiley, MD 10/18/2019, 9:16 AM

## 2019-10-18 NOTE — Plan of Care (Signed)
  Problem: Consults Goal: RH STROKE PATIENT EDUCATION Description: See Patient Education module for education specifics  Outcome: Progressing   Problem: RH BOWEL ELIMINATION Goal: RH STG MANAGE BOWEL WITH ASSISTANCE Description: STG Manage Bowel with min/mod Assistance. Outcome: Progressing Goal: RH STG MANAGE BOWEL W/MEDICATION W/ASSISTANCE Description: STG Manage Bowel with Medication with mod I Assistance. Outcome: Progressing   Problem: RH BLADDER ELIMINATION Goal: RH STG MANAGE BLADDER WITH ASSISTANCE Description: STG Manage Bladder With min/mod Assistance Outcome: Progressing   Problem: RH SKIN INTEGRITY Goal: RH STG SKIN FREE OF INFECTION/BREAKDOWN Description: Patients skin will remain free from further infection or breakdown with mod assist. Outcome: Progressing Goal: RH STG MAINTAIN SKIN INTEGRITY WITH ASSISTANCE Description: STG Maintain Skin Integrity With mod Assistance. Outcome: Progressing Goal: RH STG ABLE TO PERFORM INCISION/WOUND CARE W/ASSISTANCE Description: STG Able To Perform Incision/Wound Care With total Assistance from caregiver. Outcome: Progressing   Problem: RH SAFETY Goal: RH STG ADHERE TO SAFETY PRECAUTIONS W/ASSISTANCE/DEVICE Description: STG Adhere to Safety Precautions With supervision Assistance/Device. Outcome: Progressing   Problem: RH KNOWLEDGE DEFICIT Goal: RH STG INCREASE KNOWLEDGE OF HYPERTENSION Description: Patient/caregiver will verbalize understanding of management of HTN including diet, medications, monitoring, exercise, and follow up appointments with min assist. Outcome: Progressing Goal: RH STG INCREASE KNOWLEGDE OF HYPERLIPIDEMIA Description: Patient/caregiver will verbalize understanding of management of HLD including diet, medications, monitoring, exercise, and follow up appointments with min assist. Outcome: Progressing Goal: RH STG INCREASE KNOWLEDGE OF STROKE PROPHYLAXIS Description: Patient/caregiver will verbalize  understanding of management of stroke prophylaxis including diet, medications, monitoring, exercise, and follow up appointments with min assist. Outcome: Progressing   Problem: Consults Goal: Nutrition Consult-if indicated 10/18/2019 1118 by Erie Noe, RN Outcome: Completed/Met 10/18/2019 1118 by Erie Noe, RN Outcome: Progressing   Problem: RH PAIN MANAGEMENT Goal: RH STG PAIN MANAGED AT OR BELOW PT'S PAIN GOAL Description: < 4 10/18/2019 1118 by Erie Noe, RN Outcome: Completed/Met 10/18/2019 1118 by Erie Noe, RN Outcome: Progressing

## 2019-10-19 LAB — RENAL FUNCTION PANEL
Albumin: 2.6 g/dL — ABNORMAL LOW (ref 3.5–5.0)
Anion gap: 11 (ref 5–15)
BUN: 29 mg/dL — ABNORMAL HIGH (ref 8–23)
CO2: 25 mmol/L (ref 22–32)
Calcium: 10.2 mg/dL (ref 8.9–10.3)
Chloride: 99 mmol/L (ref 98–111)
Creatinine, Ser: 3.75 mg/dL — ABNORMAL HIGH (ref 0.61–1.24)
GFR calc Af Amer: 18 mL/min — ABNORMAL LOW (ref >60)
GFR calc non Af Amer: 16 mL/min — ABNORMAL LOW (ref >60)
Glucose, Bld: 94 mg/dL (ref 70–99)
Phosphorus: 4 mg/dL (ref 2.5–4.6)
Potassium: 3.4 mmol/L — ABNORMAL LOW (ref 3.5–5.1)
Sodium: 135 mmol/L (ref 135–145)

## 2019-10-19 NOTE — Progress Notes (Signed)
Occupational Therapy Session Note  Patient Details  Name: [name] MRN: [id number] Date of Birth: August 18, 1951  Today's Date: 10/16/19 OT Individual Time:  - 755-855 60 minutes      Short Term Goals: Week 2:  OT Short Term Goal 1 (Week 2): Pt will demonstrate carryover of 1 self ROM exercise with mod vcs OT Short Term Goal 2 (Week 2): Pt will complete BSC transfer with 2 assist for OOB toileting OT Short Term Goal 3 (Week 2): Pt will complete 1 self care task EOB with no more than Mod A for balance  Skilled Therapeutic Interventions/Progress Updates: focus of OT session as follows:  Upon approach for OT patient requesred bed pan and required time to attempt to void. But was with no success.   He completed bed mobiity with max A for lateral turns.  Otherwise, he requested to stay bedside and completed UB bathing and dressing with Min A and cues for sequencing and LB bathing and dressing jwith sequencing cues,  max A and extra time for attempting to reach bilateral feet.  Patient did participate on trunk control and core strength on bed with max A x2.  Though he fatigued quickly and requested to rest.  Patient disoriented at times and asked clinician regarding statements he thought were made earlier in the session x2.   No such statements or topics were discussed.  At end of session, patient was left in bed as requested.   Stated he was extrenely fatigued.  Call bell and phone within reach and bed alarm engaged.     Therapy Documentation Precautions:  Precautions Precautions: Fall Precaution Comments: Sacral decubitus wounds, Lt hemi Restrictions Weight Bearing Restrictions: No   Pain: Pain Assessment Pain Scale: 0-10 Pain Score: 0-No pain   Therapy/Group: Individual Therapy  Alfredia Ferguson [facility] 10/19/2019, 12:51 PM

## 2019-10-19 NOTE — Progress Notes (Signed)
Patient ID: [name], male   DOB: 07-24-1951, 68 y.o.   MRN: [id number]  [facility] Progress Note   Assessment/ Plan:   1.  COVID-19 pneumonia with hypoxic respiratory failure and prolonged hospitalization/complications: Status post decannulation of tracheostomy and removal of chest tube with transfer to CIR around 2 weeks ago following which he has been making gradual progress with rehabilitation with good oxygenation. 2. ESRD: From prolonged dialysis dependent acute kidney injury and recently intermittent hemodialysis on TTS schedule with last dialysis treatment done on 12/24.  He is nonoliguric and with apparently stable BUN/creatinine off of hemodialysis over the past 48 hours, will not order dialysis at this time and continue to follow daily labs for potential renal recovery.  He is euvolemic on exam and does not have indication for diuretics. 3. Anemia: Without overt loss, continue Aranesp for anemia of critical illness.  With iron deficiency getting intravenous iron. 4. CKD-MBD: With borderline elevated calcium levels when corrected for albumin, low phosphorus.  Will need to discontinue sucralfate as soon as possible to limit aluminum toxicity. 5. Nutrition: Continue current renal diet with oral protein supplementation to promote healing of sacral decubitus. 6. Hypertension: Euvolemic on physical exam, blood pressures acceptable. 7.  Sacral decubitus: Ongoing aggressive wound management by wound care/surgery.  On oral metronidazole.  Subjective:   Reports to be feeling fair, continues to make some progress with physical therapy and optimistic for renal recovery.   Objective:   BP 131/72 (BP Location: Right Arm)   Pulse (!) 105   Temp 98.7 F (37.1 C)   Resp 17   Ht 5\' 10"  (1.778 m)   Wt 81 kg   SpO2 99%   BMI 25.62 kg/m   Physical Exam: Gen: Resting comfortably in bed, watching television CVS: Pulse regular tachycardia, S1 and S2 normal Resp: Clear to  auscultation bilaterally, no rales/rhonchi Abd: Soft, obese, nontender Ext: No lower extremity edema  Labs: BMET Recent Labs  Lab 10/13/19 0900 10/13/19 1245 10/14/19 1809 10/15/19 1102 10/16/19 0540 10/18/19 1138 10/19/19 0651  NA 135 138 135 139 138 131* 135  K 4.0 4.4 3.5 3.9 3.4* 3.7 3.4*  CL 101 102 97* 100 100 94* 99  CO2  --  27 28 28 27 26 25   GLUCOSE 94 114* 105* 97 94 112* 94  BUN 28* 30* 11 21 27* 26* 29*  CREATININE 3.80* 4.13* 2.22* 3.35* 3.88* 3.50* 3.75*  CALCIUM  --  10.3 8.6* 9.7 10.1 9.9 10.2  PHOS  --   --  1.5*  --   --  3.6 4.0   CBC Recent Labs  Lab 10/13/19 0900 10/13/19 1245 10/14/19 1809 10/16/19 1316  WBC  --  8.6 9.9 7.4  HGB 8.8* 9.0* 8.3* 8.0*  HCT 26.0* 30.9* 27.7* 27.2*  MCV  --  94.8 93.3 95.4  PLT  --  350 296 356   Medications:    . aspirin EC  81 mg Oral Daily  . atorvastatin  40 mg Oral q1800  . budesonide (PULMICORT) nebulizer solution  0.5 mg Nebulization BID  . chlorhexidine  15 mL Mouth/Throat BID  . Chlorhexidine Gluconate Cloth  6 each Topical BID  . darbepoetin (ARANESP) injection - DIALYSIS  150 mcg Intravenous Q Sat-HD  . diclofenac Sodium  2 g Topical QID  . feeding supplement (NEPRO CARB STEADY)  237 mL Oral TID BM  . feeding supplement (PRO-STAT SUGAR FREE 64)  30 mL Oral BID  . heparin  5,000  Units Subcutaneous Q8H  . hydrocerin   Topical TID  . magic mouthwash w/lidocaine  5 mL Oral QID  . metroNIDAZOLE  500 mg Oral Q8H  . multivitamin  1 tablet Oral QHS  . pantoprazole  40 mg Oral Q1200  . polyethylene glycol  17 g Oral Daily  . ramelteon  8 mg Oral QHS  . senna-docusate  2 tablet Oral BID  . sucralfate  1 g Oral Q6H   Elmarie Shiley, MD 10/19/2019, 9:00 AM

## 2019-10-19 NOTE — Progress Notes (Signed)
Pt slept well overnight, dressing changed this am, large quarter sized yellow piece of slough attached to end of removed iodoform. Still attached to internal wound base unable to remove. Some purulent fluid is noted with pressing against wound wall. Cleaned and repacked. Pt tolerated well

## 2019-10-19 NOTE — Progress Notes (Signed)
[facility] PHYSICAL MEDICINE & REHABILITATION PROGRESS NOTE   Subjective/Complaints: Had BM today.  Pain well controlled. Working with therapy. Sleeping well at night.   ROS: Denies CP, SOB, N/V/D  Objective:   No results found. Recent Labs    10/16/19 1316  WBC 7.4  HGB 8.0*  HCT 27.2*  PLT 356   Recent Labs    10/18/19 1138 10/19/19 0651  NA 131* 135  K 3.7 3.4*  CL 94* 99  CO2 26 25  GLUCOSE 112* 94  BUN 26* 29*  CREATININE 3.50* 3.75*  CALCIUM 9.9 10.2    Intake/Output Summary (Last 24 hours) at 10/19/2019 1315 Last data filed at 10/19/2019 0700 Gross per 24 hour  Intake 120 ml  Output 600 ml  Net -480 ml     Physical Exam: Vital Signs Blood pressure 131/72, pulse (!) 105, temperature 98.7 F (37.1 C), resp. rate 17, height 5\' 10"  (1.778 m), weight 81 kg, SpO2 99 %. Constitutional: No distress . Vital signs reviewed. HENT: Normocephalic.  Atraumatic. Eyes: EOMI. No discharge. Cardiovascular: No JVD. Respiratory: Normal effort.  No stridor. GI: Non-distended. Skin: Warm and dry.  Intact. Psych: Normal mood.  Normal behavior. Musc: No edema in extremities.  No tenderness in extremities. Neuro: Alert Dysphonia, unchanged Motor: RUE/RLE: 4/5 proximal distal LUE: Shoulder abduction, elbow flexion/extension 2/5, handgrip 0/5  LLE: Hip flexion, knee extension 3 -/5, ankle dorsiflexion 0/5  Assessment/Plan: 1. Functional deficits secondary to Left hemiparesis from RIght parietal infarct   which require 3+ hours per day of interdisciplinary therapy in a comprehensive inpatient rehab setting.  Physiatrist is providing close team supervision and 24 hour management of active medical problems listed below.  Physiatrist and rehab team continue to assess barriers to discharge/monitor patient progress toward functional and medical goals  Care Tool:  Bathing    Body parts bathed by patient: Chest, Abdomen, Front perineal area, Right upper leg, Left upper  leg, Face   Body parts bathed by helper: Buttocks, Right lower leg, Left lower leg     Bathing assist Assist Level: Maximal Assistance - Patient 24 - 49%     Upper Body Dressing/Undressing Upper body dressing   What is the patient wearing?: Pull over shirt    Upper body assist Assist Level: Maximal Assistance - Patient 25 - 49%    Lower Body Dressing/Undressing Lower body dressing      What is the patient wearing?: Pants     Lower body assist Assist for lower body dressing: 2 Helpers(sit<stand)     Toileting Toileting Toileting Activity did not occur (Clothing management and hygiene only): N/A (no void or bm)  Toileting assist Assist for toileting: Total Assistance - Patient < 25% Assistive Device Comment: urinal    Transfers Chair/bed transfer  Transfers assist  Chair/bed transfer activity did not occur: Safety/medical concerns  Chair/bed transfer assist level: Maximal Assistance - Patient 25 - 49%     Locomotion Ambulation   Ambulation assist   Ambulation activity did not occur: Safety/medical concerns  Assist level: 2 helpers Assistive device: Other (comment)(rail in hall) Max distance: 4   Walk 10 feet activity   Assist  Walk 10 feet activity did not occur: Safety/medical concerns        Walk 50 feet activity   Assist Walk 50 feet with 2 turns activity did not occur: Safety/medical concerns         Walk 150 feet activity   Assist Walk 150 feet activity did not occur: Safety/medical  concerns         Walk 10 feet on uneven surface  activity   Assist Walk 10 feet on uneven surfaces activity did not occur: Safety/medical concerns         Wheelchair     Assist   Type of Wheelchair: Manual    Wheelchair assist level: Minimal Assistance - Patient > 75% Max wheelchair distance: 100    Wheelchair 50 feet with 2 turns activity    Assist        Assist Level: Minimal Assistance - Patient > 75%   Wheelchair 150  feet activity     Assist      Assist Level: Dependent - Patient 0%   Blood pressure 131/72, pulse (!) 105, temperature 98.7 F (37.1 C), resp. rate 17, height 5\' 10"  (1.778 m), weight 81 kg, SpO2 99 %.  Medical Problem List and Plan: 1.  Debility secondary to acute hypoxemic RIght parietal infarct with Left hemiparesis LE>UE   Continue CIR- PT, OT, SLP, extended stay by 7 d given potential to exceed original goals and reduce care needs  2.  Antithrombotics: -DVT/anticoagulation: Subcutaneous heparin. Negative  vascular study             -antiplatelet therapy: Aspirin 81 mg daily 3. Pain Management: Hycet as needed,some sacral pain but not severe   Relatively controlled on 12/24 4. Mood: Provide emotional support. Rozerem 8 mg nightly             -antipsychotic agents: N/A 5. Neuropsych: This patient is capable of making decisions on his own behalf.             SLP recommends additional diagnostics of higher level cognitive skills.  6. Skin/Wound Care: Wound care sacral decubitus with tunneling. , cont mattress overlay, sidelying recommended   -pack Aquacel Ag+ twice daily for WOC  -woc rn consulted- apreciate surgery input, no I and D recommended discussed with Pharmacy, expanding antibiotic coverage to include bacteroides fragilis  -nutrition, turning, prevalon boots for heels- needs order to place qhs  7. Fluids/Electrolytes/Nutrition: Routine in and outs 8.  Anemia/melena.  Continue Aranesp  Hgb 8.3 on 12/22 9.  Tracheostomy 08/22/2019.  Decannulated.  Diet advanced to regular.  Continue nebulizers as directed 10.  MSSA bacteremia/pneumonia.  Antibiotic therapy completed 11.  Hyperlipidemia.  Lipitor 12.  Post stroke oropharyngeal dysphagia: Appreciate speech therapy recommendations; regular diet with oral care BID  -MMW for thrush 13.  Renal failure after sepsis- on HD per Nephro - tent d/c 1/7 will need to make plans for OP HD- creat improving   First stage left basilic vein  transposition 12/21  12/27: Appreciate nephrology follow-up. Daily labs for potential renal recovery.  14.  Infected sacral decub- no I and D per gen surg   WBCs 7.4 on 12/24  Afebrile- does have serratia marcesens growing from wound S to ceftriaxone, Bacteroides growing out as well but no susceptibility reported as rec per Pharm, adding metronidazole for anaerobic coverage   Continue to monitor 15.  Sleep disturbance no current c/os  Melatonin started on 12/20 16. Constipation:  12/26: Continue senna-docusate BID. Made Miralax standing.   LOS: 16 days A FACE TO FACE EVALUATION WAS PERFORMED  [name] 10/19/2019, 1:15 PM

## 2019-10-19 NOTE — Progress Notes (Signed)
Dressing change provided per order. Piece of slough hanging out of wound opening (yellow, attached at base) after removal of packing. Repacked slough and reapplied packing and dressing.   Patient's LUE noted to be swollen as compared to morning. MD notified and orders given.

## 2019-10-20 ENCOUNTER — Inpatient Hospital Stay (HOSPITAL_COMMUNITY): Payer: Medicare HMO | Admitting: Occupational Therapy

## 2019-10-20 ENCOUNTER — Inpatient Hospital Stay (HOSPITAL_COMMUNITY): Payer: Medicare HMO

## 2019-10-20 DIAGNOSIS — L89150 Pressure ulcer of sacral region, unstageable: Secondary | ICD-10-CM

## 2019-10-20 DIAGNOSIS — D638 Anemia in other chronic diseases classified elsewhere: Secondary | ICD-10-CM

## 2019-10-20 DIAGNOSIS — R609 Edema, unspecified: Secondary | ICD-10-CM

## 2019-10-20 MED ORDER — NEPRO/CARBSTEADY PO LIQD
237.0000 mL | ORAL | Status: DC
  Administered 2019-10-21 – 2019-10-27 (×6): 237 mL via ORAL

## 2019-10-20 NOTE — Progress Notes (Signed)
[name] PHYSICAL MEDICINE & REHABILITATION PROGRESS NOTE   Subjective/Complaints: In good spirits. Still becomes dizzy, sob with activity. Motivated to work through it!   ROS: Patient denies fever, rash, sore throat, blurred vision, nausea, vomiting, diarrhea, cough, chest pain, joint or back pain, headache, or mood change.   Objective:   No results found. No results for input(s): WBC, HGB, HCT, PLT in the last 72 hours. Recent Labs    10/18/19 1138 10/19/19 0651  NA 131* 135  K 3.7 3.4*  CL 94* 99  CO2 26 25  GLUCOSE 112* 94  BUN 26* 29*  CREATININE 3.50* 3.75*  CALCIUM 9.9 10.2    Intake/Output Summary (Last 24 hours) at 10/20/2019 1220 Last data filed at 10/20/2019 0854 Gross per 24 hour  Intake 360 ml  Output 600 ml  Net -240 ml     Physical Exam: Vital Signs Blood pressure 133/74, pulse (!) 103, temperature 97.9 F (36.6 C), resp. rate 18, height 5\' 10"  (1.778 m), weight 81 kg, SpO2 98 %. Constitutional: No distress . Vital signs reviewed. HEENT: EOMI, oral membranes moist Neck: supple Cardiovascular: tachy 100-110 without murmur. No JVD    Respiratory: CTA Bilaterally without wheezes or rales. Normal effort    GI: BS +, non-tender, non-distended  Skin: Warm and dry.  Intact. Psych: extremely pleasant Musc: No edema in extremities.  No tenderness in extremities. Neuro: Alert Dysphonia, unchanged  Motor: RUE/RLE: 4/5 proximal distal LUE: Shoulder abduction, elbow flexion/extension 2/5, handgrip 0/5--stable exam  LLE: Hip flexion, knee extension 3 -/5, ankle dorsiflexion 0/5---stable  Assessment/Plan: 1. Functional deficits secondary to Left hemiparesis from RIght parietal infarct   which require 3+ hours per day of interdisciplinary therapy in a comprehensive inpatient rehab setting.  Physiatrist is providing close team supervision and 24 hour management of active medical problems listed below.  Physiatrist and rehab team continue to assess barriers  to discharge/monitor patient progress toward functional and medical goals  Care Tool:  Bathing    Body parts bathed by patient: Chest, Abdomen, Front perineal area, Right upper leg, Left upper leg, Face   Body parts bathed by helper: Buttocks, Right lower leg, Left lower leg     Bathing assist Assist Level: Maximal Assistance - Patient 24 - 49%     Upper Body Dressing/Undressing Upper body dressing   What is the patient wearing?: Pull over shirt    Upper body assist Assist Level: Maximal Assistance - Patient 25 - 49%    Lower Body Dressing/Undressing Lower body dressing      What is the patient wearing?: Pants     Lower body assist Assist for lower body dressing: 2 Helpers(sit<stand)     Toileting Toileting Toileting Activity did not occur (Clothing management and hygiene only): N/A (no void or bm)  Toileting assist Assist for toileting: Total Assistance - Patient < 25% Assistive Device Comment: urinal    Transfers Chair/bed transfer  Transfers assist  Chair/bed transfer activity did not occur: Safety/medical concerns  Chair/bed transfer assist level: Maximal Assistance - Patient 25 - 49%     Locomotion Ambulation   Ambulation assist   Ambulation activity did not occur: Safety/medical concerns  Assist level: 2 helpers Assistive device: Other (comment)(rail in hall) Max distance: 4   Walk 10 feet activity   Assist  Walk 10 feet activity did not occur: Safety/medical concerns        Walk 50 feet activity   Assist Walk 50 feet with 2 turns activity did not  occur: Safety/medical concerns         Walk 150 feet activity   Assist Walk 150 feet activity did not occur: Safety/medical concerns         Walk 10 feet on uneven surface  activity   Assist Walk 10 feet on uneven surfaces activity did not occur: Safety/medical concerns         Wheelchair     Assist   Type of Wheelchair: Manual    Wheelchair assist level: Minimal  Assistance - Patient > 75% Max wheelchair distance: 100    Wheelchair 50 feet with 2 turns activity    Assist        Assist Level: Minimal Assistance - Patient > 75%   Wheelchair 150 feet activity     Assist      Assist Level: Dependent - Patient 0%   Blood pressure 133/74, pulse (!) 103, temperature 97.9 F (36.6 C), resp. rate 18, height 5\' 10"  (1.778 m), weight 81 kg, SpO2 98 %.  Medical Problem List and Plan: 1.  Debility secondary to acute hypoxemic RIght parietal infarct with Left hemiparesis LE>UE   Continue CIR- PT, OT, SLP, extended stay by 7 d given potential to exceed original goals and reduce care needs  2.  Antithrombotics: -DVT/anticoagulation: Subcutaneous heparin. Negative  vascular study             -antiplatelet therapy: Aspirin 81 mg daily 3. Pain Management: Hycet as needed,some sacral pain but not severe   Relatively controlled on 12/28 4. Mood: Provide emotional support. Rozerem 8 mg nightly             -antipsychotic agents: N/A 5. Neuropsych: This patient is capable of making decisions on his own behalf.             SLP recommends additional diagnostics of higher level cognitive skills.  6. Skin/Wound Care: Wound care sacral decubitus with tunneling. , cont mattress overlay, sidelying recommended   -pack Aquacel Ag+ twice daily for WOC  -woc rn consulted- apreciate surgery input, no I and D recommended discussed with Pharmacy, expanding antibiotic coverage to include bacteroides fragilis    -nutrition, turning, prevalon boots for heels   7. Fluids/Electrolytes/Nutrition: Routine in and outs 8.  Anemia/melena.  Continue Aranesp  Hgb 8.3 on 12/22 9.  Tracheostomy 08/22/2019.  Decannulated.  Diet advanced to regular.  Continue nebulizers as directed 10.  MSSA bacteremia/pneumonia.  Antibiotic therapy completed 11.  Hyperlipidemia.  Lipitor 12.  Post stroke oropharyngeal dysphagia: Appreciate speech therapy recommendations; regular diet with oral  care BID  -MMW for thrush 13.  Renal failure after sepsis- on HD per Nephro - tent d/c 1/7 will need to make plans for OP HD- creat improving   First stage left basilic vein transposition 12/21  12/27: Appreciate nephrology follow-up. Daily labs for potential renal recovery.  14.  Infected sacral decub- no I and D per gen surg   -afebrile  WBCs 7.4 on 12/24  - serratia marcesens growing from wound S to ceftriaxone  -Bacteroides growing out as well but no susceptibility reported as rec per Pharm,   on metronidazole for anaerobic coverage   Continue to monitor 15.  Sleep disturbance no current c/os  Melatonin started on 12/20 16. Constipation:  12/26: Continue senna-docusate BID. Made Miralax standing.    -large bm 12/27 LOS: 17 days A FACE TO [place] 10/20/2019, 12:20 PM

## 2019-10-20 NOTE — Progress Notes (Signed)
Physical Therapy Session Note  Patient Details  Name: [name] MRN: [id number] Date of Birth: [birth year]  Today's Date: 10/20/2019 PT Individual Time: 0903-1005 PT Individual Time Calculation (min): 62 min   Short Term Goals: Week 3:  PT Short Term Goal 1 (Week 3): Pt will transfer to and from bed with mod assist and LRAD with lateral scoot/squat pivot PT Short Term Goal 2 (Week 3): Pt will ambulate >38ft with max assist +2 and LRAD PT Short Term Goal 3 (Week 3): Pt will propell WC >124ft with supervision assist. PT Short Term Goal 4 (Week 3): pt will maintain sitting balance EOB with supervision assist for improved independence with ADLs PT Short Term Goal 5 (Week 3): Pt will tolerate standing up to 1 min  with mod assist and LRAD  Skilled Therapeutic Interventions/Progress Updates: Pt presented in bed agreeable to therapy. Pt stating continues to have some pain at great toe and behind L knee. Nsg aware and voltaren gel provided during session to affected areas. Pt's brief ripped so pt performed rolling L minA, R modA to change brief and don pants. Performed supine to sit with maxA and maxA for scooting to EOB. Performed SB transfer to standard w/c (with Roho cushion) maxA and pt transported to rehab gym hallway. Performed STS x 3 with wall rail and mod/maxA x 1.  Pt required facilitation at hips for improved anterior translation and multimodal cues for maintaining midline with mirror feedback. Pt unable to maintain standing for >10 sec due to fatigue, but was able to come to immediate full standing with second and third attempts. Pt then performed SB transfer to R to high/low mat and participated in dynamic balance performing beach ball taps. Pt able to play with moderate challenges and had only one occurrence of LOB requiring minA from PTA for recovery. Pt returned to w/c in same manner as prior requiring maxA. Pt transported back to room at end of session and remained in standard w/c with  belt alarm on, call bell within reach and nsg present to set up IV.      Therapy Documentation Precautions:  Precautions Precautions: Fall Precaution Comments: Sacral decubitus wounds, Lt hemi Restrictions Weight Bearing Restrictions: No General:   Vital Signs:  Pain: Pain Assessment Pain Score: 0-No pain   Therapy/Group: Individual Therapy  Marcelo Ickes  Vergene Marland, PTA  10/20/2019, 2:47 PM

## 2019-10-20 NOTE — Progress Notes (Signed)
MEWS yellow due to heart rate (120), VS taken at time of dressing change, VS taken again HR now 113 which seems to be WNL for pt. Will recheck again since has had several other things going on but will update as no acute change in pt condition.

## 2019-10-20 NOTE — Progress Notes (Signed)
Nutrition Follow-up  DOCUMENTATION CODES:  Not applicable  INTERVENTION:  - Nepro Shake podaily, each supplement provides 425 kcal and 19 grams protein  - ContinuePro-stat30 mlBID, each supplement provides 100 kcals and 15 grams protein.  - Continue Regular diet order  -ContinuerenalMVI daily at bedtime  - Encourage adequate PO intake  NUTRITION DIAGNOSIS:   Increased nutrient needs related to acute illness, wound healing as evidenced by estimated needs.  Ongoing, being addressed via supplements  GOAL:   Patient will meet greater than or equal to 90% of their needs  Progressing  MONITOR:   PO intake, Supplement acceptance, Labs, Weight trends, Skin  REASON FOR ASSESSMENT:   Consult Wound healing  ASSESSMENT:   68 yo male admitted to CIR on 12/11 after long hospitalization due to respiratory failure from COVID 19 pna, AKI requiring CRRT and ultimately iHD. PMH include COVID 19 infection, heart burn  12/21 - s/p first stage L basilic vein transposition  Discharge date tentatively set for 11/07/19.  Dialysis being held for now. Per Nephrology, "may be recovering function." Last HD was on 12/24 with 1500 ml net UF.  No new weight since 10/07/19. Weight down 9 lbs from 12/11 to 12/15.  Spoke with pt at bedside. Pt reports appetite is good and that he is eating well at most meals. Pt eating from lunch tray at time of visit.  Pt reports that he is not drinking Nepro shakes because they are "a boring drink." Explained need for additional kcal and protein to promote wound healing. Pt reports he will "force down" one Nepro shake daily. RD will adjust orders.  Pt taking Pro-stat per MAR.  Meal Completion: 25-100% x last 8 meals (averaging 67%)  Medications reviewed and include: Nepro TID, Pro-stat BID, rena-vit, protonix, Miralax, senna, sucralfate, IV abx  Labs reviewed: potassium 3.4, hemoglobin 8.0  UOP: 700 ml x 24 hours  NUTRITION - FOCUSED  PHYSICAL EXAM:    Most Recent Value  Orbital Region  No depletion  Upper Arm Region  No depletion  Thoracic and Lumbar Region  Mild depletion  Buccal Region  No depletion  Temple Region  Mild depletion  Clavicle Bone Region  Moderate depletion  Clavicle and Acromion Bone Region  Moderate depletion  Scapular Bone Region  Unable to assess  Dorsal Hand  Mild depletion  Patellar Region  Moderate depletion  Anterior Thigh Region  Moderate depletion  Posterior Calf Region  Severe depletion  Edema (RD Assessment)  None  Hair  Reviewed  Eyes  Reviewed  Mouth  Reviewed  Skin  Reviewed  Nails  Reviewed       Diet Order:   Diet Order            Diet regular Room service appropriate? Yes; Fluid consistency: Thin  Diet effective now              EDUCATION NEEDS:   Not appropriate for education at this time  Skin:  Skin Assessment: Skin Integrity Issues: Skin Integrity Issues: Stage III: sacrum (per WOC note)  Last BM:  10/19/19  Height:   Ht Readings from Last 1 Encounters:  10/15/19 5\' 10"  (1.778 m)    Weight:   Wt Readings from Last 1 Encounters:  10/02/19 74.1 kg    Ideal Body Weight:  75.5 kg  BMI:  Body mass index is 25.62 kg/m.  Estimated Nutritional Needs:   Kcal:  2200-2400 kcals  Protein:  115-140 grams  Fluid:  1000 mL plus UOP  Kate Jablonski Parthiv Mucci, MS, RD, LDN Inpatient Clinical Dietitian Pager: [phone] Weekend/After Hours: [phone]  

## 2019-10-20 NOTE — Progress Notes (Signed)
Patient ID: [name], male   DOB: 1951/02/19, 68 y.o.   MRN: [id number]  [facility] Progress Note   Assessment/ Plan:   1.  COVID-19 pneumonia with hypoxic respiratory failure and prolonged hospitalization/complications: Status post decannulation of tracheostomy and transfer to CIR. He continues making gradual progress with rehabilitation with good oxygenation. 2. ESRD: From prolonged dialysis dependent acute kidney injury and recently intermittent hemodialysis on TTS schedule with last dialysis treatment done on 12/24.  He is making urine and creat is stable < 4.0. Will cont to hold dialysis for now, may be recovering function. He is euvolemic on exam.  3. Anemia: Without overt loss, continue Aranesp for anemia of critical illness.  With iron deficiency getting intravenous iron. 4. CKD-MBD: With borderline elevated calcium levels when corrected for albumin, low phosphorus.  Will need to discontinue sucralfate as soon as possible to limit aluminum toxicity. 5. Nutrition: Continue current renal diet with oral protein supplementation to promote healing of sacral decubitus. 6. Hypertension: Euvolemic on physical exam, blood pressures acceptable. 7.  Sacral decubitus: Ongoing aggressive wound management by wound care/surgery.  On oral metronidazole.  [name], MD 10/20/2019, 10:23 AM    Subjective:   Reports to feeling good, no new/ co.    Objective:   BP 133/74 (BP Location: Right Arm)   Pulse (!) 103   Temp 97.9 F (36.6 C)   Resp 18   Ht 5\' 10"  (1.778 m)   Wt 81 kg   SpO2 98%   BMI 25.62 kg/m   Physical Exam: Gen: seen in room, up in WC, feeling good CVS: Pulse regular tachycardia, S1 and S2 normal Resp: Clear to auscultation bilaterally, no rales/rhonchi Abd: Soft, obese, nontender Ext: No lower extremity edema  Labs: BMET Recent Labs  Lab 10/13/19 1245 10/14/19 1809 10/15/19 1102 10/16/19 0540 10/18/19 1138 10/19/19 0651  NA 138 135 139 138  131* 135  K 4.4 3.5 3.9 3.4* 3.7 3.4*  CL 102 97* 100 100 94* 99  CO2 27 28 28 27 26 25   GLUCOSE 114* 105* 97 94 112* 94  BUN 30* 11 21 27* 26* 29*  CREATININE 4.13* 2.22* 3.35* 3.88* 3.50* 3.75*  CALCIUM 10.3 8.6* 9.7 10.1 9.9 10.2  PHOS  --  1.5*  --   --  3.6 4.0   CBC Recent Labs  Lab 10/13/19 1245 10/14/19 1809 10/16/19 1316  WBC 8.6 9.9 7.4  HGB 9.0* 8.3* 8.0*  HCT 30.9* 27.7* 27.2*  MCV 94.8 93.3 95.4  PLT 350 296 356   Medications:    . aspirin EC  81 mg Oral Daily  . atorvastatin  40 mg Oral q1800  . budesonide (PULMICORT) nebulizer solution  0.5 mg Nebulization BID  . chlorhexidine  15 mL Mouth/Throat BID  . Chlorhexidine Gluconate Cloth  6 each Topical BID  . darbepoetin (ARANESP) injection - DIALYSIS  150 mcg Intravenous Q Sat-HD  . diclofenac Sodium  2 g Topical QID  . feeding supplement (NEPRO CARB STEADY)  237 mL Oral TID BM  . feeding supplement (PRO-STAT SUGAR FREE 64)  30 mL Oral BID  . heparin  5,000 Units Subcutaneous Q8H  . hydrocerin   Topical TID  . magic mouthwash w/lidocaine  5 mL Oral QID  . metroNIDAZOLE  500 mg Oral Q8H  . multivitamin  1 tablet Oral QHS  . pantoprazole  40 mg Oral Q1200  . polyethylene glycol  17 g Oral Daily  . ramelteon  8 mg Oral  QHS  . senna-docusate  2 tablet Oral BID  . sucralfate  1 g Oral Q6H

## 2019-10-20 NOTE — Progress Notes (Signed)
Occupational Therapy Weekly Progress Note  Patient Details  Name: [name] MRN: [id number] Date of Birth: December 22, 1950  Beginning of progress report period: 10/12/2019 End of progress report period: 10/21/2019  Today's Date: 10/20/2019 OT Individual Time: 1105-1150 and 1416-1445 OT Individual Time Calculation (min): 45 min and 29 min  Patient has met 2 of 3 short term goals.    Pt has made good progress at time of report. He can now maintain dynamic sitting balance with steady assist and complete toilet transfers using Stedy with 2 assist. His Lt arm is exhibiting muscular return, however the UE is not yet functional. Max A of 1 for slideboard transfers at this time, whereas during last report period pt needed +2 for slideboard transfers. Continue POC.     Patient continues to demonstrate the following deficits: muscle weakness, muscle joint tightness and muscle paralysis, decreased cardiorespiratoy endurance, abnormal tone, unbalanced muscle activation, decreased coordination and decreased motor planning,  decreased problem solving and hemiplegia, decreased postural control, decreased sitting balance, decreased standing balance and therefore will continue to benefit from skilled OT intervention to enhance overall performance with BADL.  Patient progressing toward long term goals..  Continue plan of care.  OT Short Term Goals Week 2:  OT Short Term Goal 1 (Week 2): Pt will demonstrate carryover of 1 self ROM exercise with mod vcs OT Short Term Goal 1 - Progress (Week 2): Met OT Short Term Goal 2 (Week 2): Pt will complete BSC transfer with 2 assist for OOB toileting OT Short Term Goal 2 - Progress (Week 2): Progressing toward goal OT Short Term Goal 3 (Week 2): Pt will complete 1 self care task EOB with no more than Mod A for balance OT Short Term Goal 3 - Progress (Week 2): Met Week 3:  OT Short Term Goal 1 (Week 3): Pt will complete BSC or toilet transfer with 1 assist using LRAD OT  Short Term Goal 2 (Week 3): Pt will complete 2 grooming tasks EOB with no more than Min A for balance OT Short Term Goal 3 (Week 3): Pt will complete UB dressing with Mod A  Skilled Therapeutic Interventions/Progress Updates:    Pt greeted in w/c, reporting that buttocks was numb and that he wanted to return to bed. Slideboard<bed completed with Max A. Once EOB, pt engaged in anterior and lateral reaching to target to work on sitting balance. Pt required steady assist for reaching towards Lt due to LOB, however pt able to recover from LOB himself. Mod A for lateral scoots up towards [place] before returning to supine. Pt able to boost himself up in trendelenburg position with use of headboard using his Rt arm. With [facility] raised, worked on [place] for [employer] and edema mgt. Pt with new swelling in forearm. Discussed importance of frequent self ROM in the daytime to address. Therapist completed passive stretching to shoulder external rotation and forearm supination due to tightness. Pt exhibits shoulder movements with gravity minimized but not yet against gravity. Noted improved elbow flexion today. Active wrist extension against gravity and grasp/release. Passive stretching also completed to digits due to tight extensors. L UE continues to present with ataxic movements during ROM. At end of session pt was comfortably positioned in sidelying for pressure relief. Left him with call bell and 4 bedrails up.    2nd Session 1: 1 tx (29 min) Pt greeted in bed, still very fatigued from therapies but willing to attempt toilet transfer using Stedy. With increased time, he  completed supine<sit with close supervision today! While EOB pt required vcs for anterior weight shifting due to posterior lean while Stedy was being placed. Mod A +2 for power up into lift. He is still unable to maintain Lt grip on Stedy bar unassisted. While sitting on elevated toilet pt with continent B+B void. Max A of 1 for sit<stand in [place]. He was  able to stand for 10 second windows while OT performed perihygiene, brief change, and clothing mgt. Rest breaks required in-between stands due to fatigue. Pt was then returned to bed via Stedy. Max A for sit<supine with pt able to boost himself up using headboard once bed was placed in trendelenburg position. He was left in bed with all needs within reach and 4 bedrails up. Tx focus placed on ADL retraining, supported standing, sitting/standing balance, and NMR.    Therapy Documentation Precautions:  Precautions Precautions: Fall Precaution Comments: Sacral decubitus wounds, Lt hemi Restrictions Weight Bearing Restrictions: No Vital Signs: Therapy Vitals Temp: 97.9 F (36.6 C) Pulse Rate: (!) 103 Resp: 18 BP: 133/74 Patient Position (if appropriate): Lying Oxygen Therapy SpO2: 98 % O2 Device: Room Air Pain:(1st session) in buttocks, placed pt in sidelying at end of session to address (2nd session) in Lt knee, pt declined use of hot/cold modalities to address    ADL: ADL Eating: Maximal assistance(for sitting balance) Where Assessed-Eating: Edge of bed Grooming: Maximal assistance Where Assessed-Grooming: Edge of bed Upper Body Bathing: Maximal assistance Where Assessed-Upper Body Bathing: Edge of bed Lower Body Bathing: Dependent(+2 assist) Where Assessed-Lower Body Bathing: Bed level Upper Body Dressing: Dependent Where Assessed-Upper Body Dressing: Edge of bed Lower Body Dressing: Dependent(+2 assist) Where Assessed-Lower Body Dressing: Bed level Toileting: Not assessed Toilet Transfer: Not assessed Tub/Shower Transfer: Not assessed     Therapy/Group: Individual Therapy   A  10/20/2019, 7:27 AM

## 2019-10-20 NOTE — Progress Notes (Signed)
VASCULAR LAB PRELIMINARY  PRELIMINARY  PRELIMINARY  PRELIMINARY  Left upper extremity venous duplex completed.    Preliminary report:  See CV proc for preliminary results.   Aarohi Redditt, RVT 10/20/2019, 2:19 PM

## 2019-10-20 NOTE — Progress Notes (Signed)
7 Days Post-Op    CC:  Subjective: Tolerating dressing changes well. He reports some pain with dressing changes otherwise no issues with wound.  Objective: Vital signs in last 24 hours: Temp:  [97.5 F (36.4 C)-98.3 F (36.8 C)] 97.9 F (36.6 C) (12/28 0552) Pulse Rate:  [103-110] 103 (12/28 0552) Resp:  [18-20] 18 (12/28 0552) BP: (113-133)/(68-80) 133/74 (12/28 0552) SpO2:  [95 %-99 %] 98 % (12/28 0756) Last BM Date: 10/19/19 600 PO 700 urine Stool x 1 Afebrile, VSS HR 100's K+ 3.4, creatinine 3.75 H/H 8/27.2(12/24) WBC 7.4(12/24)  Intake/Output from previous day: 12/27 0701 - 12/28 0700 In: 600 [P.O.:600] Out: 700 [Urine:700] Intake/Output this shift: No intake/output data recorded.  PE: Gen: Alert, NAD, pleasant HEENT: EOM's intact, pupils equal and round Pulm:rate andeffort normal Abd: Soft, NT/ND Psych: A&Ox3 Msk: no BLE edema Skin: warm and dry YT:KPTWSF wound measuring 2x1x3cmwith significant tunneling, about 4-6cm in all directions, some necrotic tissue debrided. no induration, cellulitis, or tenderness  Lab Results:  No results for input(s): WBC, HGB, HCT, PLT in the last 72 hours.  BMET Recent Labs    10/18/19 1138 10/19/19 0651  NA 131* 135  K 3.7 3.4*  CL 94* 99  CO2 26 25  GLUCOSE 112* 94  BUN 26* 29*  CREATININE 3.50* 3.75*  CALCIUM 9.9 10.2   PT/INR No results for input(s): LABPROT, INR in the last 72 hours.  Recent Labs  Lab 10/14/19 1809 10/18/19 1138 10/19/19 0651  ALBUMIN 2.6* 2.9* 2.6*     Lipase  No results found for: LIPASE   Medications: . aspirin EC  81 mg Oral Daily  . atorvastatin  40 mg Oral q1800  . budesonide (PULMICORT) nebulizer solution  0.5 mg Nebulization BID  . chlorhexidine  15 mL Mouth/Throat BID  . Chlorhexidine Gluconate Cloth  6 each Topical BID  . darbepoetin (ARANESP) injection - DIALYSIS  150 mcg Intravenous Q Sat-HD  . diclofenac Sodium  2 g Topical QID  . feeding supplement (NEPRO  CARB STEADY)  237 mL Oral TID BM  . feeding supplement (PRO-STAT SUGAR FREE 64)  30 mL Oral BID  . heparin  5,000 Units Subcutaneous Q8H  . hydrocerin   Topical TID  . magic mouthwash w/lidocaine  5 mL Oral QID  . metroNIDAZOLE  500 mg Oral Q8H  . multivitamin  1 tablet Oral QHS  . pantoprazole  40 mg Oral Q1200  . polyethylene glycol  17 g Oral Daily  . ramelteon  8 mg Oral QHS  . senna-docusate  2 tablet Oral BID  . 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 Ok to shower with wound open. If drainage does no improve may need to consider opening wound up further.  FEN -Reg VTE -SCDs, ASA, subcutaneous heparin ID -Rocephin/flagyl per primary team, wound cx w/serratia marcescens. Afebrile, wbc 9.9 (12/22)   LOS: 17 days    Wellington Hampshire, [facility] 10/20/2019, 3:30 PM Please see Amion for pager number during day hours 7:00am-4:30pm

## 2019-10-20 NOTE — Progress Notes (Signed)
Speech Language Pathology Daily Session Note  Patient Details  Name: [name] MRN: [id number] Date of Birth: 1951/03/20  Today's Date: 10/20/2019 SLP Individual Time: 4469-5072 SLP Individual Time Calculation (min): 50 min  Short Term Goals: Week 3: SLP Short Term Goal 1 (Week 3): Patient will complete higher level reasoning and executive functioning tasks with 90% accuracy and min cues. SLP Short Term Goal 2 (Week 3): Patient will state and demonstrate compensatory cognitive-linguistic strategies as trained with 90% accuracy and min cues. SLP Short Term Goal 3 (Week 3): Patient will complete delayed recall tasks for 5 stimuil post ~5 minute delay with 80% accuracy and supervision A verbal cues.  Skilled Therapeutic Interventions: Skilled ST services focused on cognitive skills. SLP facilitated recall of 5 novel words with 10 minute delay, pt demonstrated recall of 4 out 5 word and with category cue 5 out 5 words, utilizing visualization and repetition strategies. SLP also facilitated complex problem solving and executive function skills in deductive reasoning task, pt required max A verbal cues, demonstrating greater problem solving skills verse organization skills. Pt required max A verbal cues to recall and utilize executive function strategies, therefore SLP recommends completing similar task in upcoming sessions to assess carryover in familiar tasks. Pt was left in room with call bell within reach and bed alarm set. ST recommends to continue skilled ST services.      Pain Pain Assessment Pain Score: 0-No pain  Therapy/Group: Individual Therapy  Jaedynn Bohlken  [facility] 10/20/2019, 12:28 PM

## 2019-10-21 ENCOUNTER — Inpatient Hospital Stay (HOSPITAL_COMMUNITY): Payer: Medicare HMO

## 2019-10-21 ENCOUNTER — Inpatient Hospital Stay (HOSPITAL_COMMUNITY): Payer: Medicare HMO | Admitting: Occupational Therapy

## 2019-10-21 LAB — BASIC METABOLIC PANEL
Anion gap: 11 (ref 5–15)
BUN: 39 mg/dL — ABNORMAL HIGH (ref 8–23)
CO2: 25 mmol/L (ref 22–32)
Calcium: 10.3 mg/dL (ref 8.9–10.3)
Chloride: 104 mmol/L (ref 98–111)
Creatinine, Ser: 4.02 mg/dL — ABNORMAL HIGH (ref 0.61–1.24)
GFR calc Af Amer: 17 mL/min — ABNORMAL LOW (ref >60)
GFR calc non Af Amer: 14 mL/min — ABNORMAL LOW (ref >60)
Glucose, Bld: 99 mg/dL (ref 70–99)
Potassium: 3.2 mmol/L — ABNORMAL LOW (ref 3.5–5.1)
Sodium: 140 mmol/L (ref 135–145)

## 2019-10-21 MED ORDER — POLYETHYLENE GLYCOL 3350 17 G PO PACK
17.0000 g | PACK | Freq: Two times a day (BID) | ORAL | Status: DC
  Administered 2019-10-21 – 2019-10-29 (×10): 17 g via ORAL
  Filled 2019-10-21 (×24): qty 1

## 2019-10-21 NOTE — Progress Notes (Signed)
Current PIV assessed. Flushes easily with no signs of infiltration. Pt reports previous PIV pain, but denies at present with brisk flush. Discussed findings with RN. Recommended slowing down antibiotic infusions and warm compress; RN agrees and will consult for any further issues.

## 2019-10-21 NOTE — Progress Notes (Signed)
Occupational Therapy Session Note  Patient Details  Name: [name] MRN: [id number] Date of Birth: 18-Jun-1951  Today's Date: 10/21/2019 OT Individual Time: 6811-5726 OT Individual Time Calculation (min): 73 min    Short Term Goals: Week 3:  OT Short Term Goal 1 (Week 3): Pt will complete BSC or toilet transfer with 1 assist using LRAD OT Short Term Goal 2 (Week 3): Pt will complete 2 grooming tasks EOB with no more than Min A for balance OT Short Term Goal 3 (Week 3): Pt will complete UB dressing with Mod A  Skilled Therapeutic Interventions/Progress Updates:    Upon entering the room, pt supine in bed and agreeable to OT intervention. Pt rolling L <> R with mod A and max A bed level to pull pants over B hips. Supine >sit with max A to EOB. OT placing slide board and max A needed to transfer from bed >wheelchair towards the R with min cuing for head hip relationship. Pt positioned to eat breakfast as it had arrived and needing set up A to open containers. After eating for 5 minutes, pt reporting, " I think I just had a bowel movement in my pants. OT assisted pt into standing with +2 assistance for safety with use of STEDY and assisted into bathroom. Once seated on toilet he was unable to have further BM but able to void urine. Pt able to stand with assist of 1 person into stedy and OT assisted pt with hygiene and clothing management but pt needing multiple seated rest breaks on stedy. Pt returning to bed at end of session secondary to fatigue. Total A for sit >supine for safety. Call bell and all needed items within reach upon exiting the room.  Therapy Documentation Precautions:  Precautions Precautions: Fall Precaution Comments: Sacral decubitus wounds, Lt hemi Restrictions Weight Bearing Restrictions: No Vital Signs: Oxygen Therapy SpO2: 96 % O2 Device: Room Air ADL: ADL Eating: Maximal assistance(for sitting balance) Where Assessed-Eating: Edge of bed Grooming: Maximal  assistance Where Assessed-Grooming: Edge of bed Upper Body Bathing: Maximal assistance Where Assessed-Upper Body Bathing: Edge of bed Lower Body Bathing: Dependent(+2 assist) Where Assessed-Lower Body Bathing: Bed level Upper Body Dressing: Dependent Where Assessed-Upper Body Dressing: Edge of bed Lower Body Dressing: Dependent(+2 assist) Where Assessed-Lower Body Dressing: Bed level Toileting: Not assessed Toilet Transfer: Not assessed Tub/Shower Transfer: Not assessed Vision   Perception    Praxis   Exercises:   Other Treatments:     Therapy/Group: Individual Therapy  Gypsy Decant 10/21/2019, 11:28 AM

## 2019-10-21 NOTE — Progress Notes (Signed)
Discussed with Silvestre Mesi PA-c will continue with plan of care. Request IV team to assess line to RUE.

## 2019-10-21 NOTE — Progress Notes (Signed)
[place] PHYSICAL MEDICINE & REHABILITATION PROGRESS NOTE   Subjective/Complaints: Has no complaints this morning. Just had therapy session where he worked on self-care. Has some left knee and shoulder pain and his heating pad helps. Does not want any additional gels or patches at this time.  Still constipated at times.    ROS: Patient denies fever, rash, sore throat, blurred vision, nausea, vomiting, diarrhea, cough, chest pain, joint or back pain, headache, or mood change.   Objective:   VAS [place] UPPER EXTREMITY VENOUS DUPLEX  Result Date: 10/20/2019 UPPER VENOUS STUDY  Indications: Swelling, and skin sloughing Risk Factors: Right fistula (1st stage left AVF). Recent CVA. Limitations: Patient positioning. Comparison Study: No prior study Performing Technologist: Sharion Dove RVS  Examination Guidelines: A complete evaluation includes B-mode imaging, spectral Doppler, color Doppler, and power Doppler as needed of all accessible portions of each vessel. Bilateral testing is considered an integral part of a complete examination. Limited examinations for reoccurring indications may be performed as noted.  Left Findings: +----------+------------+---------+-----------+----------+--------------+ LEFT      CompressiblePhasicitySpontaneousProperties   Summary     +----------+------------+---------+-----------+----------+--------------+ IJV           Full       Yes       Yes                             +----------+------------+---------+-----------+----------+--------------+ Subclavian               Yes       Yes                             +----------+------------+---------+-----------+----------+--------------+ Axillary                 Yes       Yes                             +----------+------------+---------+-----------+----------+--------------+ Brachial      Full                                                  +----------+------------+---------+-----------+----------+--------------+ Radial        Full                                                 +----------+------------+---------+-----------+----------+--------------+ Ulnar         Full                                                 +----------+------------+---------+-----------+----------+--------------+ Cephalic                                            Not visualized +----------+------------+---------+-----------+----------+--------------+ Basilic  Not visualized +----------+------------+---------+-----------+----------+--------------+  Summary:  Left: No evidence of deep vein thrombosis in the upper extremity.  *See table(s) above for measurements and observations.    Preliminary    No results for input(s): WBC, HGB, HCT, PLT in the last 72 hours. Recent Labs    10/19/19 0651 10/21/19 0535  NA 135 140  K 3.4* 3.2*  CL 99 104  CO2 25 25  GLUCOSE 94 99  BUN 29* 39*  CREATININE 3.75* 4.02*  CALCIUM 10.2 10.3    Intake/Output Summary (Last 24 hours) at 10/21/2019 0849 Last data filed at 10/21/2019 0130 Gross per 24 hour  Intake 356 ml  Output 525 ml  Net -169 ml     Physical Exam: Vital Signs Blood pressure 140/76, pulse 100, temperature 98.3 F (36.8 C), temperature source Oral, resp. rate 18, height 5\' 10"  (1.778 m), weight 81 kg, SpO2 96 %. Constitutional: No distress . Vital signs reviewed. HEENT: EOMI, oral membranes moist Neck: supple Cardiovascular: tachy 100-110 without murmur. No JVD    Respiratory: CTA Bilaterally without wheezes or rales. Normal effort    GI: BS +, non-tender, non-distended  Skin: Warm and dry.  Intact. Psych: extremely pleasant Musc: No edema in extremities.  No tenderness in extremities. Neuro: Alert Dysphonia, unchanged  Motor: RUE/RLE: 4/5 proximal distal LUE: Shoulder abduction, elbow flexion/extension 2/5, handgrip  0/5--stable exam  LLE: Hip flexion, knee extension 3/5, ankle dorsiflexion 0/5---stable  Assessment/Plan: 1. Functional deficits secondary to Left hemiparesis from RIght parietal infarct   which require 3+ hours per day of interdisciplinary therapy in a comprehensive inpatient rehab setting.  Physiatrist is providing close team supervision and 24 hour management of active medical problems listed below.  Physiatrist and rehab team continue to assess barriers to discharge/monitor patient progress toward functional and medical goals  Care Tool:  Bathing    Body parts bathed by patient: Chest, Abdomen, Front perineal area, Right upper leg, Left upper leg, Face   Body parts bathed by helper: Buttocks, Right lower leg, Left lower leg     Bathing assist Assist Level: Maximal Assistance - Patient 24 - 49%     Upper Body Dressing/Undressing Upper body dressing   What is the patient wearing?: Pull over shirt    Upper body assist Assist Level: Maximal Assistance - Patient 25 - 49%    Lower Body Dressing/Undressing Lower body dressing      What is the patient wearing?: Pants     Lower body assist Assist for lower body dressing: 2 Helpers(sit<stand)     Toileting Toileting Toileting Activity did not occur (Clothing management and hygiene only): N/A (no void or bm)  Toileting assist Assist for toileting: 2 Helpers(using Stedy) Assistive Device Comment: urinal    Transfers Chair/bed transfer  Transfers assist  Chair/bed transfer activity did not occur: Safety/medical concerns  Chair/bed transfer assist level: Maximal Assistance - Patient 25 - 49%     Locomotion Ambulation   Ambulation assist   Ambulation activity did not occur: Safety/medical concerns  Assist level: 2 helpers Assistive device: Other (comment)(rail in hall) Max distance: 4   Walk 10 feet activity   Assist  Walk 10 feet activity did not occur: Safety/medical concerns        Walk 50 feet  activity   Assist Walk 50 feet with 2 turns activity did not occur: Safety/medical concerns         Walk 150 feet activity   Assist Walk 150 feet activity did not occur: Safety/medical concerns  Walk 10 feet on uneven surface  activity   Assist Walk 10 feet on uneven surfaces activity did not occur: Safety/medical concerns         Wheelchair     Assist   Type of Wheelchair: Manual    Wheelchair assist level: Minimal Assistance - Patient > 75% Max wheelchair distance: 100    Wheelchair 50 feet with 2 turns activity    Assist        Assist Level: Minimal Assistance - Patient > 75%   Wheelchair 150 feet activity     Assist      Assist Level: Dependent - Patient 0%   Blood pressure 140/76, pulse 100, temperature 98.3 F (36.8 C), temperature source Oral, resp. rate 18, height 5\' 10"  (1.778 m), weight 81 kg, SpO2 96 %.  Medical Problem List and Plan: 1.  Debility secondary to acute hypoxemic RIght parietal infarct with Left hemiparesis LE>UE   Continue CIR- PT, OT, SLP, extended stay by 7 d given potential to exceed original goals and reduce care needs  2.  Antithrombotics: -DVT/anticoagulation: Subcutaneous heparin. Negative  vascular study             -antiplatelet therapy: Aspirin 81 mg daily 3. Pain Management: Hycet as needed,some sacral pain but not severe   Relatively controlled on 12/28 4. Mood: Provide emotional support. Rozerem 8 mg nightly             -antipsychotic agents: N/A 5. Neuropsych: This patient is capable of making decisions on his own behalf.             SLP recommends additional diagnostics of higher level cognitive skills.  6. Skin/Wound Care: Wound care sacral decubitus with tunneling. , cont mattress overlay, sidelying recommended   -pack Aquacel Ag+ twice daily for WOC  -woc rn consulted- apreciate surgery input, no I and D recommended discussed with Pharmacy, expanding antibiotic coverage to include  bacteroides fragilis    -nutrition, turning, prevalon boots for heels   7. Fluids/Electrolytes/Nutrition: Routine in and outs 8.  Anemia/melena.  Continue Aranesp  Hgb 8.3 on 12/22 9.  Tracheostomy 08/22/2019.  Decannulated.  Diet advanced to regular.  Continue nebulizers as directed 10.  MSSA bacteremia/pneumonia.  Antibiotic therapy completed 11.  Hyperlipidemia.  Lipitor 12.  Post stroke oropharyngeal dysphagia: Appreciate speech therapy recommendations; regular diet with oral care BID  -MMW for thrush 13.  Renal failure after sepsis- on HD per Nephro - tent d/c 1/7 will need to make plans for OP HD- creat improving   First stage left basilic vein transposition 12/21  12/27: Appreciate nephrology follow-up. Daily labs for potential renal recovery.  14.  Infected sacral decub- no I and D per gen surg   -afebrile  WBCs 7.4 on 12/24  - serratia marcesens growing from wound S to ceftriaxone  -Bacteroides growing out as well but no susceptibility reported as rec per Pharm,   on metronidazole for anaerobic coverage   Continue to monitor 15.  Sleep disturbance no current c/os  Melatonin started on 12/20 16. Constipation:  12/26: Continue senna-docusate BID. Made Miralax standing.    -large bm 12/27  12/29: No BM since 12/27 and patient feels constipated. Will make Miralax BID. LOS: 18 days A FACE TO FACE EVALUATION WAS PERFORMED  Clide Deutscher Mariona Scholes 10/21/2019, 8:49 AM

## 2019-10-21 NOTE — Progress Notes (Signed)
Physical Therapy Session Note  Patient Details  Name: [name] MRN: [id number] Date of Birth: 09-25-1951  Today's Date: 10/21/2019 PT Individual Time:900-915 and  [id number] PT Individual Time Calculation (min):  15 min and 66 min   Short Term Goals: Week 3:  PT Short Term Goal 1 (Week 3): Pt will transfer to and from bed with mod assist and LRAD with lateral scoot/squat pivot PT Short Term Goal 2 (Week 3): Pt will ambulate >26ft with max assist +2 and LRAD PT Short Term Goal 3 (Week 3): Pt will propell WC >154ft with supervision assist. PT Short Term Goal 4 (Week 3): pt will maintain sitting balance EOB with supervision assist for improved independence with ADLs PT Short Term Goal 5 (Week 3): Pt will tolerate standing up to 1 min  with mod assist and LRAD  Skilled Therapeutic Interventions/Progress Updates: Pt presented in bed agreeable to therapy. Pt performed supine to sit with modA and use of bed features. Performed SB transfer to R to w/c modA. Pt requesting to perform oral hygiene at sink which was performed with set up. PTA noted that she was at pt's room at wrong time and pt agreeable to remain in w/c until PTA returned for completion of session.  Tx2: Upon PTA arrival back at room pt transported to rehab gym and performed SB transfer to mat mod/maxA. Participated in STS with use of maxi-Sky and cardiac walker. Pt was able to perform x 5 STS initially requiring maxA progressing to modA with standing bouts of up to 1 min ea. PTA provided manual facilitation of increasing L knee extension and wt shifiting to L to increase wt bearing through LLE. Pt noted increased fatigue after last stand, HR monitored during activity, maintained 105-115. Performed SB transfer back to w/c maxA. Pt transported to day room and performed Cybex Kinetron 60cm/sec 20 three bouts of 20 cycles for reciprocal activity and hamstring activation. PTA then donned R shoe and pt participated in w/c propulsion approx  112ft minA fading to supervision. Pt did require cues for negotiation particularly avoiding objects on L however pt would also benefit from lower w/c for improved R foot placement. Once back in room pt performed SB transfer to L maxA and modA sit to supine. Pt used bed rail to boost self up to [facility], however was able to perform segmental bridging with cues and PTA blocking B feet to reposition self to center of bed. Pt left in bed with call bell within reach and handoff to SLP for next session.      Therapy Documentation Precautions:  Precautions Precautions: Fall Precaution Comments: Sacral decubitus wounds, Lt hemi Restrictions Weight Bearing Restrictions: No General:   Vital Signs: Therapy Vitals Temp: 98.2 F (36.8 C) Pulse Rate: (!) 113 Resp: 17 BP: 124/65 Patient Position (if appropriate): Lying Oxygen Therapy SpO2: 99 % O2 Device: Room Air    Therapy/Group: Individual Therapy  Rorik Vespa  [name], PTA  10/21/2019, 4:38 PM

## 2019-10-21 NOTE — Progress Notes (Addendum)
[facility]  Progress Note   Assessment/ Plan:   1. COVID-19 pneumonia with hypoxic respiratory failure and prolonged hospitalization/complications: Status post decannulation of tracheostomy and transfer to CIR. He continues making gradual progress with rehabilitation with good oxygenation.  2. ESRD: From prolonged dialysis dependent COVID-related AKI, had intermittent hemodialysis on TTS schedule with last dialysis treatment done on 12/24. He is making urine and creat is rising slowly w/o uremic symptoms. Hopefully will recover soon. No indication for RRT, cont to follow.  3. Anemia: Without overt loss, continue Aranesp for anemia of critical illness. With iron deficiency getting intravenous iron.  4. CKD-MBD: With borderline elevated calcium levels when corrected for albumin, low phosphorus. Will need to discontinue sucralfate as soon as possible to limit aluminum toxicity.  5. Nutrition: Continue current renal diet with oral protein supplementation to promote healing of sacral decubitus.  6. Hypertension: Euvolemic on physical exam, blood pressures acceptable.  7. Sacral decubitus: Ongoing aggressive wound management by wound care/surgery. On oral metronidazole.    [name], MD  10/21/2019, 12:52 PM    Subjective:   Reports to feeling good, no new/ co.    Objective:   BP 140/76 (BP Location: Right Arm)  Pulse 100  Temp 98.3 F (36.8 C) (Oral)  Resp 18  Ht 5\' 10"  (1.778 m)  Wt 81 kg  SpO2 96%  BMI 25.62 kg/m  Physical Exam:  Gen: seen in room, up in WC, feeling good  CVS: Pulse regular tachycardia, S1 and S2 normal  Resp: Clear to auscultation bilaterally, no rales/rhonchi  Abd: Soft, obese, nontender  Ext: No lower extremity edema  Labs:    Medications:    . aspirin EC 81 mg Oral Daily  . atorvastatin 40 mg Oral q1800  . budesonide (PULMICORT) nebulizer solution 0.5 mg Nebulization BID  . chlorhexidine 15 mL Mouth/Throat BID  . Chlorhexidine  Gluconate Cloth 6 each Topical BID  . darbepoetin (ARANESP) injection - DIALYSIS 150 mcg Intravenous Q Sat-HD  . diclofenac Sodium 2 g Topical QID  . feeding supplement (NEPRO CARB STEADY) 237 mL Oral Q24H  . feeding supplement (PRO-STAT SUGAR FREE 64) 30 mL Oral BID  . heparin 5,000 Units Subcutaneous Q8H  . hydrocerin  Topical TID  . magic mouthwash w/lidocaine 5 mL Oral QID  . metroNIDAZOLE 500 mg Oral Q8H  . multivitamin 1 tablet Oral QHS  . pantoprazole 40 mg Oral Q1200  . polyethylene glycol 17 g Oral BID  . ramelteon 8 mg Oral QHS  . senna-docusate 2 tablet Oral BID  . sucralfate 1 g Oral Q6H

## 2019-10-21 NOTE — Progress Notes (Signed)
Received call from IV team wanted to know if pt could be changed over to PO form due to limited access and IV was just placed. Reviewed C&S, Cipro has sensitivity 0.25.  Did inform her that started IV med and pt c/o pain so stopped immediately. She doesn't feel pt can continue iv med at this time or pt needs another route poss dialysis cath if must continue iv med.  Will discuss with PA and see if another alternative is available. D

## 2019-10-21 NOTE — Progress Notes (Signed)
Speech Language Pathology Daily Session Note  Patient Details  Name: Clemens Lachman MRN: [id number] Date of Birth: 1951-06-18  Today's Date: 10/21/2019 SLP Individual Time: 1102-1130 SLP Individual Time Calculation (min): 28 min  Short Term Goals: Week 3: SLP Short Term Goal 1 (Week 3): Patient will complete higher level reasoning and executive functioning tasks with 90% accuracy and min cues. SLP Short Term Goal 2 (Week 3): Patient will state and demonstrate compensatory cognitive-linguistic strategies as trained with 90% accuracy and min cues. SLP Short Term Goal 3 (Week 3): Patient will complete delayed recall tasks for 5 stimuil post ~5 minute delay with 80% accuracy and supervision A verbal cues.  Skilled Therapeutic Interventions:Skilled ST services focused on cognitive skills. Pt expressed desire and concern about continued low vocal intensity with speak. SLP provided education about rational for OP ENT due to prolonged intubation and excessive coughing from COVID, but recommends RMT assessment in next session to address vocal intensity. Pt demonstrated recall of executive function strategies in familiar task with 70% accuracy given min A verbal cues. SLP facilitated use of executive function skills in familiar complex deductive reasoning task, pt required mod A verbal cues. Pt was left in room with call bell within reach and bed alarm set. ST recommends to continue skilled ST services.      Pain Pain Assessment Pain Score: 0-No pain  Therapy/Group: Individual Therapy  Trinidi Toppins  [facility] 10/21/2019, 4:41 PM

## 2019-10-22 ENCOUNTER — Inpatient Hospital Stay (HOSPITAL_COMMUNITY): Payer: Medicare HMO

## 2019-10-22 ENCOUNTER — Inpatient Hospital Stay (HOSPITAL_COMMUNITY): Payer: Medicare HMO | Admitting: Occupational Therapy

## 2019-10-22 ENCOUNTER — Encounter (HOSPITAL_COMMUNITY): Payer: Medicare HMO | Admitting: Psychology

## 2019-10-22 LAB — BASIC METABOLIC PANEL
Anion gap: 10 (ref 5–15)
BUN: 39 mg/dL — ABNORMAL HIGH (ref 8–23)
CO2: 24 mmol/L (ref 22–32)
Calcium: 10.2 mg/dL (ref 8.9–10.3)
Chloride: 104 mmol/L (ref 98–111)
Creatinine, Ser: 3.81 mg/dL — ABNORMAL HIGH (ref 0.61–1.24)
GFR calc Af Amer: 18 mL/min — ABNORMAL LOW (ref >60)
GFR calc non Af Amer: 15 mL/min — ABNORMAL LOW (ref >60)
Glucose, Bld: 96 mg/dL (ref 70–99)
Potassium: 3.2 mmol/L — ABNORMAL LOW (ref 3.5–5.1)
Sodium: 138 mmol/L (ref 135–145)

## 2019-10-22 MED ORDER — SODIUM CHLORIDE 0.9 % IV SOLN: INTRAVENOUS | Status: DC | PRN

## 2019-10-22 MED ORDER — POTASSIUM CHLORIDE CRYS ER 20 MEQ PO TBCR
20.0000 meq | EXTENDED_RELEASE_TABLET | Freq: Every day | ORAL | Status: AC
  Administered 2019-10-23 – 2019-10-25 (×3): 20 meq via ORAL
  Filled 2019-10-22 (×3): qty 1

## 2019-10-22 MED ORDER — LIDOCAINE 5 % EX PTCH
1.0000 | MEDICATED_PATCH | CUTANEOUS | Status: DC
  Administered 2019-10-22 – 2019-11-03 (×4): 1 via TRANSDERMAL
  Filled 2019-10-22 (×12): qty 1

## 2019-10-22 MED ORDER — POTASSIUM CHLORIDE CRYS ER 20 MEQ PO TBCR
40.0000 meq | EXTENDED_RELEASE_TABLET | Freq: Once | ORAL | Status: AC
  Administered 2019-10-22: 40 meq via ORAL
  Filled 2019-10-22: qty 2

## 2019-10-22 NOTE — Patient Care Conference (Signed)
Inpatient RehabilitationTeam Conference and Plan of Care Update Date: 10/22/2019   Time: 10:00 AM   Patient Name: [name]      Medical Record Number: [id number]  Date of Birth: [birth year] Sex: Male         Room/Bed: 4W03C/4W03C-01 Payor Info: Payor: [employer] MEDICARE / Plan: [place] HMO / Product Type: *No Product type* /    Admit Date/Time:  10/03/2019  2:27 PM  Primary Diagnosis:  Debility  Patient Active Problem List   Diagnosis Date Noted  . Sleep disturbance   . Anemia of chronic disease   . Dependent on hemodialysis ([place])   . Dysphagia, post-stroke   . Pressure injury of sacral region, unstageable ([place])   . Oropharyngeal dysphagia 10/03/2019  . Debility 10/03/2019  . Acute blood loss anemia 09/06/2019  . H. pylori infection 09/06/2019  . CVA (cerebral vascular accident) ([place]) 09/06/2019  . Anoxic brain injury ([place]) 09/06/2019  . MSSA bacteremia 09/06/2019  . Positive D dimer 09/06/2019  . Pseudomonas infection 09/06/2019  . Infection due to acinetobacter baumannii 09/06/2019  . Sinus tachycardia 09/05/2019  . Advanced care planning/counseling discussion   . Goals of care, counseling/discussion   . Palliative care by specialist   . Cerebral thrombosis with cerebral infarction 08/28/2019  . Tracheostomy status ([place])   . Acute respiratory distress syndrome (ARDS) due to COVID-19 virus ([place])   . Chest tube in place   . Primary spontaneous pneumothorax   . Acute respiratory failure ([place])   . Gastric ulcer with hemorrhage 08/10/2019  . Constipation 08/09/2019  . Atrial fibrillation with RVR ([place]) 08/07/2019  . Acute renal failure (ARF) ([place]) 08/06/2019  . GI bleed 08/06/2019  . Pneumothorax on right   . Fluid overload 07/27/2019  . Pneumonia due to COVID-19 virus 07/25/2019  . Acute respiratory failure due to COVID-19 ([place]) 07/25/2019  . AKI (acute kidney injury) ([place]) 07/25/2019  . Dyspepsia 11/26/2014  . BPH (benign prostatic hyperplasia) 10/31/2013   . Achilles rupture 04/05/2011  . S/P Achilles tendon repair 04/05/2011    Expected Discharge Date: Expected Discharge Date: 11/07/19  Team Members Present: Physician leading conference: Dr. Leeroy Cha Social Worker Present: Ovidio Kin, LCSW Nurse Present: Dorthula Nettles, RN Case Manager: Karene Fry, RN PT Present: Leavy Cella, PT;Rosita Dechalus, PTA OT Present: Darleen Crocker, OT SLP Present: Charolett Bumpers, SLP PPS Coordinator present : Gunnar Fusi, SLP     Current Status/Progress Goal Weekly Team Focus  Bowel/Bladder   occasonal epiosdes of incontinence of bowel, utilizing urinal  remain continent  monitor q shift and prn   Swallow/Nutrition/ Hydration             ADL's   Bathing ADL not completed this week, Mod-Max A UB self care w/c level at sink, +2 for LB dressing sit<stand using Stedy, Max A slideboard transfers  Downgraded to Mod A overall  NMR, ADL retraining, sitting balance, trunk control, functional transfers, functional cognition, pt/family education   Mobility   modA bed mobility, maxA SB transfer, SBA sitting balance, maxA STS, maxA gait 62ft at wall rail, minA w/c propulsion via hemi technique  minA overall  L NMR, standing tolerance, pre-gait activities, endurance   Communication   RMT to increase vocal intensity  Mod I - adding goal  RMT for increase vocal intensity   Safety/Cognition/ Behavioral Observations  Mod-Min A complex problem solving/executive function, Min A recall  Supervision A - might need to be downgraded  complex problem solving/execuive function and recall strategis  Pain   no c/o pain or dsitress, has prns available  pain scale <4/10  assess q shift and prn   Skin   tunnelled wound to coccyx, pack with iodoform strips TID, purulent drainage.  no new areas of skin break down, no infection, improve coccyx wound  assess q shift and prn      *See Care Plan and progress notes for long and short-term goals.     Barriers to  Discharge  Current Status/Progress Possible Resolutions Date Resolved   Nursing                  PT                    OT                  SLP                SW                Discharge Planning/Teaching Needs:  Wife has been in to observe some therapies, she is aware of his need for 24 hr physical care at DC. Neuro-psych seeing for coping      Team Discussion: Sacral ulcer, IV abx, meds adjusted for bowels.  RN drsg changed to stage 4 sacral wound, lidocaine patch to be applied, will hold off on HD, kidney function improving.  OT tot A LB ADL, L knee pain, transfers max A, sit EOB close S, uncomfortable up in chair.  PT roll L min a, roll R mod/max, mod/max supine to sit, slideboard mod to max A, sit to stand max A, ?benefit from knee brace?  SLP min/mod problem solving and recall, S for speech.  Wife wants patient to stay long as possible.   Revisions to Treatment Plan: N/A     Medical Summary Current Status: Left shoulder and knee pain, sacral ulcer improving, will continue IV abx for one week, kidney function is improving and so holding off on dialysis Weekly Focus/Goal: pain control, especially in left shoulder and knee.  Barriers to Discharge: Medical stability  Barriers to Discharge Comments: IV antibiotics, increase in pain regimen-- heating pack and lidocaine patch. Possible Resolutions to Barriers: completion of IV antibiotics course in 1 week, continued therapies, better pain control.   Continued Need for Acute Rehabilitation Level of Care: The patient requires daily medical management by a physician with specialized training in physical medicine and rehabilitation for the following reasons: Direction of a multidisciplinary physical rehabilitation program to maximize functional independence : Yes Medical management of patient stability for increased activity during participation in an intensive rehabilitation regime.: Yes Analysis of laboratory values and/or radiology  reports with any subsequent need for medication adjustment and/or medical intervention. : Yes   I attest that I was present, lead the team conference, and concur with the assessment and plan of the team.   Jodell Cipro M 10/22/2019, 8:17 PM  Team conference was held via web/ teleconference due to [place] - 19

## 2019-10-22 NOTE — Progress Notes (Signed)
Orthopedic Tech Progress Note Patient Details:  [name] 08-25-1951 [id number]  Ortho Devices Type of Ortho Device: Knee Sleeve Ortho Device/Splint Location: LLE Ortho Device/Splint Interventions: Application, Ordered   Post Interventions Patient Tolerated: Well Instructions Provided: Care of device, Adjustment of device   Janit Pagan 10/22/2019, 5:36 PM

## 2019-10-22 NOTE — Progress Notes (Signed)
Social Work Patient ID: [name], male   DOB: May 28, 1951, 68 y.o.   MRN: [id number] Message left for wife to inform team conference and progress pt is making. Still working toward discharge date of 1/15. Pt very pleased holding off on HD due to kidneys starting to function. Work toward discharge and have wife come in closer to date for training.

## 2019-10-22 NOTE — Progress Notes (Signed)
[facility] Progress Note  9 Days Post-Op  Subjective: CC-  No complaints this morning. Tolerating dressing changes well.  Objective: Vital signs in last 24 hours: Temp:  [98.2 F (36.8 C)-98.7 F (37.1 C)] 98.4 F (36.9 C) (12/30 0549) Pulse Rate:  [105-113] 105 (12/30 0549) Resp:  [17-18] 18 (12/30 0549) BP: (123-128)/(65-76) 123/70 (12/30 0549) SpO2:  [95 %-100 %] 95 % (12/30 0549) Last BM Date: 10/22/19  Intake/Output from previous day: 12/29 0701 - 12/30 0700 In: 480 [P.O.:480] Out: 550 [Urine:550] Intake/Output this shift: Total I/O In: 150 [P.O.:150] Out: 80 [Urine:80]  PE: Gen: Alert, NAD, pleasant HEENT: EOM's intact, pupils equal and round Pulm:rate andeffort normal Abd: Soft, NT/ND Psych: A&Ox3 Msk: no BLE edema. Left hemiparesis Skin: warm and dry CX:KGYJEH wound measuring 2x1x3cmwith significant tunneling, about 4-5cm in all directions, no purulent drainage or necrotic tissue noted on exam today. no induration, cellulitis, or tenderness   Lab Results:  No results for input(s): WBC, HGB, HCT, PLT in the last 72 hours. BMET Recent Labs    10/21/19 0535 10/22/19 0527  NA 140 138  K 3.2* 3.2*  CL 104 104  CO2 25 24  GLUCOSE 99 96  BUN 39* 39*  CREATININE 4.02* 3.81*  CALCIUM 10.3 10.2   PT/INR No results for input(s): LABPROT, INR in the last 72 hours. CMP     Component Value Date/Time   NA 138 10/22/2019 0527   NA 143 11/08/2017 1030   K 3.2 (L) 10/22/2019 0527   CL 104 10/22/2019 0527   CO2 24 10/22/2019 0527   GLUCOSE 96 10/22/2019 0527   BUN 39 (H) 10/22/2019 0527   BUN 8 11/08/2017 1030   CREATININE 3.81 (H) 10/22/2019 0527   CREATININE 1.15 11/20/2014 1224   CALCIUM 10.2 10/22/2019 0527   CALCIUM 9.2 10/07/2019 1422   PROT 6.6 09/10/2019 0431   PROT 7.2 11/08/2017 1030   ALBUMIN 2.6 (L) 10/19/2019 0651   ALBUMIN 4.6 11/08/2017 1030   AST 35 09/10/2019 0431   ALT 6 09/10/2019 0431   ALKPHOS 73 09/10/2019  0431   BILITOT 0.4 09/10/2019 0431   BILITOT 0.4 11/08/2017 1030   GFRNONAA 15 (L) 10/22/2019 0527   GFRAA 18 (L) 10/22/2019 0527   Lipase  No results found for: LIPASE     Studies/Results: VAS [place] UPPER EXTREMITY VENOUS DUPLEX  Result Date: 10/21/2019 UPPER VENOUS STUDY  Indications: Swelling, and skin sloughing Risk Factors: Right fistula (1st stage left AVF). Recent CVA. Limitations: Patient positioning. Comparison Study: No prior study Performing Technologist: Sharion Dove RVS  Examination Guidelines: A complete evaluation includes B-mode imaging, spectral Doppler, color Doppler, and power Doppler as needed of all accessible portions of each vessel. Bilateral testing is considered an integral part of a complete examination. Limited examinations for reoccurring indications may be performed as noted.  Left Findings: +----------+------------+---------+-----------+----------+--------------+ LEFT      CompressiblePhasicitySpontaneousProperties   Summary     +----------+------------+---------+-----------+----------+--------------+ IJV           Full       Yes       Yes                             +----------+------------+---------+-----------+----------+--------------+ Subclavian               Yes       Yes                             +----------+------------+---------+-----------+----------+--------------+  Axillary                 Yes       Yes                             +----------+------------+---------+-----------+----------+--------------+ Brachial      Full                                                 +----------+------------+---------+-----------+----------+--------------+ Radial        Full                                                 +----------+------------+---------+-----------+----------+--------------+ Ulnar         Full                                                  +----------+------------+---------+-----------+----------+--------------+ Cephalic                                            Not visualized +----------+------------+---------+-----------+----------+--------------+ Basilic                                             Not visualized +----------+------------+---------+-----------+----------+--------------+  Summary:  Left: No evidence of deep vein thrombosis in the upper extremity.  *See table(s) above for measurements and observations.  Diagnosing physician: Ruta Hinds MD Electronically signed by Ruta Hinds MD on 10/21/2019 at 1:47:13 PM.    Final     Anti-infectives: Anti-infectives (From admission, onward)   Start     Dose/Rate Route Frequency Ordered Stop   10/15/19 0200  metroNIDAZOLE (FLAGYL) tablet 500 mg     500 mg Oral Every 8 hours 10/14/19 1953 10/27/19 2359   10/14/19 1400  metroNIDAZOLE (FLAGYL) tablet 500 mg  Status:  Discontinued     500 mg Oral Every 8 hours 10/14/19 1121 10/14/19 1953   10/14/19 0730  cefTRIAXone (ROCEPHIN) 1 g in sodium chloride 0.9 % 100 mL IVPB     1 g 200 mL/hr over 30 Minutes Intravenous Every 24 hours 10/14/19 0726 10/27/19 2359   10/13/19 0600  cefUROXime (ZINACEF) 1.5 g in sodium chloride 0.9 % 100 mL IVPB     1.5 g 200 mL/hr over 30 Minutes Intravenous On call to O.R. 10/12/19 0956 10/14/19 0559       Assessment/Plan HxCOVID (9/20) Tracheostomy tube 10/30s/p decannulation Atrial fibrillation CVA ARFon HD   Sacral decubitus - No role for surgical intervention. Wound is much cleaner, no drainage noted on exam today. Decrease dressing changes to BID; flush wound with normal saline during dressing changes and pack with packing strips BID. Ok to shower with wound open.  General surgery will sign off at this time, please call us back with any questions or concerns.  FEN -Reg VTE -SCDs, ASA, subcutaneous  heparin ID -Rocephin/flagylper primary team, wound cx w/serratia  marcescens. Afebrile, wbc7.4 (12/24)   LOS: 19 days    Wellington Hampshire, [facility] Surgery 10/22/2019, 9:34 AM Please see Amion for pager number during day hours 7:00am-4:30pm

## 2019-10-22 NOTE — Progress Notes (Signed)
Physical Therapy Session Note  Patient Details  Name: [name] MRN: [id number] Date of Birth: 10-31-1950  Today's Date: 10/22/2019 PT Individual Time: 1035-1130 PT Individual Time Calculation (min): 55 min   Short Term Goals: Week 3:  PT Short Term Goal 1 (Week 3): Pt will transfer to and from bed with mod assist and LRAD with lateral scoot/squat pivot PT Short Term Goal 2 (Week 3): Pt will ambulate >36f with max assist +2 and LRAD PT Short Term Goal 3 (Week 3): Pt will propell WC >1044fwith supervision assist. PT Short Term Goal 4 (Week 3): pt will maintain sitting balance EOB with supervision assist for improved independence with ADLs PT Short Term Goal 5 (Week 3): Pt will tolerate standing up to 1 min  with mod assist and LRAD  Skilled Therapeutic Interventions/Progress Updates: Pt presented in bed sleeping but easily aroused and agreeable to therapy. Pt stating some pain in L knee but able to tolerate therapy without intervention. Performed supine to sit with mod verbal cues for sequencing and modA. PTA donned shoes and set up pt for SB transfer. Performed SB transfer with mod fading to minA with verbal cues to increase anterior lean and verbal cues for BLE management. Pt transported to rehab gym hallway and performed STS x 3 with modA for STS but maxA for placing weight on LLE and to achieve full extension with PTA blocking L knee. On third attempt pt was able to come to full standing and participate in pre-gait activities stepping RLE forward/backwards. Upon return to w/c pt performed SB transfer to mat min/modA. Participated in following seated/supine L NMR via forced use: LAQ, supine heel slides, hip abd/add, AASAQ, modified bridges from bolster. Pt required maxA for supine to sit from high/low mat. Performed SB transfer back to w/c minA!. Pt transported back to room and encouraged to remain in w/c until after lunch. Pt left with call bell within reach, belt alarm on, and needs met.        Therapy Documentation Precautions:  Precautions Precautions: Fall Precaution Comments: Sacral decubitus wounds, Lt hemi Restrictions Weight Bearing Restrictions: No General:   Vital Signs: Therapy Vitals Temp: 99 F (37.2 C) Temp Source: Oral Pulse Rate: (!) 115 Resp: 18 BP: 118/69 Patient Position (if appropriate): Lying Oxygen Therapy SpO2: 100 % O2 Device: Room Air   Therapy/Group: Individual Therapy  Rad Gramling  Mayjor Ager, PTA  10/22/2019, 4:17 PM

## 2019-10-22 NOTE — Consult Note (Signed)
Neuropsychological Consultation   Patient:   [name]   DOB:   10-13-1951  MR Number:  [id number]  Location:  [facility] [place] [id number] [place] [address] Dept: [place]: [phone]           Date of Service:   10/22/2019  Start Time:   1 PM End Time:   2 PM  Provider/Observer:  Ilean Skill, Psy.D.       Clinical Neuropsychologist       Billing Code/Service: 96158/[id number]  Chief Complaint:    [name] is a 68 year old male with history of GERD.  Patient presented to [facility] on 07/26/2019 with SOB and hypoxia.  Dx with COVID 19 on July 13, 2019.  Patient treated and did require intubation.  Hospital course further complicated by bouts of afib.  Neurology consulted on 08/27/2019 for altered mental status with MRI showing small acute to early subacute infarcts in bilateral cerebral cortex and right cerebellum.  Patient also developed sacral decubitus   Reason for Service:  Patient referred for neuropsychological consultation due to coping and adjustment issues.  Below is the HPI for the current admission.  HPI: [name] is a 68 year old right-handed male with history of GERD who presented to [facility] 07/26/2019 with shortness of breath and hypoxia.  He was diagnosed with COVID-19 on September 20 of 2020.  Oxygen saturations in the 40th percentile range.  Placed on a nonrebreather mask.  Patient was given IV steroids and remdesivir that he completed 07/30/2019.  He did require intubation for airway protection.  He had 1 unit of convalescent plasma and had received Decadron until 08/04/2019.  He was treated with vancomycin and cefepime through 08/02/2019.  Initial chest x-ray demonstrated diffuse multifocal infiltrates.  CT of the chest negative for pulmonary emboli.  He was admitted to [facility] pH 7.41, PCO2 44 PO2 of 87 on 100% and AA  gradient calculates out to 571.  Noted elevated creatinine of 1.51.  Renal service is consulted for elevated creatinine/AKI necessitating the need for hemodialysis 08/07/2019 but has since been deemed chronic Tuesday Thursday Saturday schedule.  Gastroenterology services consulted for ongoing melena drop in hemoglobin and patient was transfused 10 units of packed red blood cells total throughout hospital admission with latest hemoglobin 8.4.  An endoscopy was completed showing no active bleeding or blood in the stomach.  Very large gastric residual mostly tube feeding without anatomic gastric outlet obstruction suggestive gastric ileus.  Patient's hemoglobin hematocrit have stabilized.  Long-term intubation requiring tracheostomy tube 08/22/2019 patient is since been extubated.  Diet has been advanced to regular consistency.  Subcutaneous heparin for DVT prophylaxis.  Hospital course complicated by bouts of atrial fibrillation with RVR 08/09/2019 patient was initially placed on amiodarone.  Neurology service was consulted 08/27/2019 for altered mental status with MRI of the brain showing small acute to early subacute infarcts in bilateral cerebral cortex and right cerebellum and maintained on low-dose aspirin for CVA prophylaxis.. Patient with development of sacral decubitus wound care dressing changes as per wound care nurse as well as wound care to partial-thickness areas of tissue loss at posterior aspect of penile shaft again with dressing changes as directed.  Therapy evaluations completed patient was admitted for a comprehensive rehab program.   Current Status:  The patient has continued to improve with motor function and cognition but continues with slowed information processing, residual motor deficits (  improving), but good mood state and motivation.      Behavioral Observation: [name]  presents as a 68 y.o.-year-old Right Caucasian Male who appeared his stated age. his dress was Appropriate and  he was Well Groomed and his manners were Appropriate to the situation.  his participation was indicative of Appropriate and Redirectable behaviors.  There were any physical disabilities noted.  he displayed an appropriate level of cooperation and motivation.     Interactions:    Active Appropriate  Attention:   abnormal and attention span appeared shorter than expected for age  Memory:   within normal limits; recent and remote memory intact  Visuo-spatial:  not examined  Speech (Volume):  low  Speech:   normal; normal  Thought Process:  Coherent and Relevant  Though Content:  WNL; not suicidal and not homicidal  Orientation:   person, place, time/date and situation  Judgment:   Good  Planning:   Fair  Affect:    Appropriate and Lethargic  Mood:    Euthymic  Insight:   Fair  Intelligence:   normal  Medical History:   Past Medical History:  Diagnosis Date  . COVID-19   . Heartburn    Psychiatric History:  No prior psychiatric history.  Family Med/Psych History:  Family History  Problem Relation Age of Onset  . Colon cancer Neg Hx   . Colon polyps Neg Hx     Risk of Suicide/Violence: virtually non-existent   Impression/DX:  [name] is a 68 year old male with history of GERD.  Patient presented to [facility] on 07/26/2019 with SOB and hypoxia.  Dx with COVID 19 on July 13, 2019.  Patient treated and did require intubation.  Hospital course further complicated by bouts of afib.  Neurology consulted on 08/27/2019 for altered mental status with MRI showing small acute to early subacute infarcts in bilateral cerebral cortex and right cerebellum.  Patient also developed sacral decubitus   The patient has continued to improve with motor function and cognition but continues with slowed information processing, residual motor deficits (improving), but good mood state and motivation.   Disposition/Plan:  Will follow up with patient next week due to coping  issues and residual CVA.  Diagnosis:    Cerebrovascular accident (CVA), unspecified mechanism ([place]) - Plan: Ambulatory referral to Neurology  Debility  Swelling - Plan: VAS [place] UPPER EXTREMITY VENOUS DUPLEX, VAS [place] UPPER EXTREMITY VENOUS DUPLEX, CANCELED: [place] LT UPPER EXTREM LTD SOFT TISSUE NON VASCULAR, CANCELED: [place] LT UPPER EXTREM LTD SOFT TISSUE NON VASCULAR         Electronically Signed   _______________________ Ilean Skill, Psy.D.

## 2019-10-22 NOTE — Progress Notes (Signed)
Occupational Therapy Session Note  Patient Details  Name: [name] MRN: [id number] Date of Birth: [birth year]  Today's Date: 10/22/2019 OT Individual Time: 2595-6387 OT Individual Time Calculation (min): 55 min    Short Term Goals: Week 3:  OT Short Term Goal 1 (Week 3): Pt will complete BSC or toilet transfer with 1 assist using LRAD OT Short Term Goal 2 (Week 3): Pt will complete 2 grooming tasks EOB with no more than Min A for balance OT Short Term Goal 3 (Week 3): Pt will complete UB dressing with Mod A  Skilled Therapeutic Interventions/Progress Updates:    Upon entering the room, pt supine in bed and agreeable to OT intervention. Pt requesting to perform bathing and dressing tasks. LB self care performed from bed level for safety. Pt able to wash peri area with set up A and rolling with mod A for therapist to assist with buttocks. Pt does report increase in pain in L knee with bed mobility tasks. Supine >sit with max A to EOB. Pt able to demonstrate static sitting for 5 minutes with close supervision - min guard. Pt required min A for dynamic sitting balance while washing UB seated on EOB and donning clothing items. Pt needing max A for hemiplegic technique. Hand over hand assistance to incorporate L UE into functional tasks. Pt declined transfer into wheelchair secondary to fatigue. Total A sit >supine secondary to fatigue. Pt repositioned for comfort and to protect hemiplegic side. Call bell and all needed items within reach upon exiting the room.   Therapy Documentation Precautions:  Precautions Precautions: Fall Precaution Comments: Sacral decubitus wounds, Lt hemi Restrictions Weight Bearing Restrictions: No Vital Signs: Therapy Vitals Temp: 98.4 F (36.9 C) Pulse Rate: (!) 105 Resp: 18 BP: 123/70 Patient Position (if appropriate): Lying Oxygen Therapy SpO2: 95 % O2 Device: Room Air ADL: ADL Eating: Maximal assistance(for sitting balance) Where Assessed-Eating:  Edge of bed Grooming: Maximal assistance Where Assessed-Grooming: Edge of bed Upper Body Bathing: Maximal assistance Where Assessed-Upper Body Bathing: Edge of bed Lower Body Bathing: Dependent(+2 assist) Where Assessed-Lower Body Bathing: Bed level Upper Body Dressing: Dependent Where Assessed-Upper Body Dressing: Edge of bed Lower Body Dressing: Dependent(+2 assist) Where Assessed-Lower Body Dressing: Bed level Toileting: Not assessed Toilet Transfer: Not assessed Tub/Shower Transfer: Not assessed   Therapy/Group: Individual Therapy  Gypsy Decant 10/22/2019, 9:01 AM

## 2019-10-22 NOTE — Progress Notes (Signed)
[facility] PHYSICAL MEDICINE & REHABILITATION PROGRESS NOTE   Subjective/Complaints: Has no complaints this morning. Sacral dressing being changed.  Has some left knee and shoulder pain and his heating pad helps.  Spoke with pharmacy yesterday and will continue IV antibiotics for one more week.    ROS: Patient denies fever, rash, sore throat, blurred vision, nausea, vomiting, diarrhea, cough, chest pain, joint or back pain, headache, or mood change.   Objective:   VAS [place] UPPER EXTREMITY VENOUS DUPLEX  Result Date: 10/21/2019 UPPER VENOUS STUDY  Indications: Swelling, and skin sloughing Risk Factors: Right fistula (1st stage left AVF). Recent CVA. Limitations: Patient positioning. Comparison Study: No prior study Performing Technologist: Sharion Dove RVS  Examination Guidelines: A complete evaluation includes B-mode imaging, spectral Doppler, color Doppler, and power Doppler as needed of all accessible portions of each vessel. Bilateral testing is considered an integral part of a complete examination. Limited examinations for reoccurring indications may be performed as noted.  Left Findings: +----------+------------+---------+-----------+----------+--------------+ LEFT      CompressiblePhasicitySpontaneousProperties   Summary     +----------+------------+---------+-----------+----------+--------------+ IJV           Full       Yes       Yes                             +----------+------------+---------+-----------+----------+--------------+ Subclavian               Yes       Yes                             +----------+------------+---------+-----------+----------+--------------+ Axillary                 Yes       Yes                             +----------+------------+---------+-----------+----------+--------------+ Brachial      Full                                                 +----------+------------+---------+-----------+----------+--------------+ Radial         Full                                                 +----------+------------+---------+-----------+----------+--------------+ Ulnar         Full                                                 +----------+------------+---------+-----------+----------+--------------+ Cephalic                                            Not visualized +----------+------------+---------+-----------+----------+--------------+ Basilic  Not visualized +----------+------------+---------+-----------+----------+--------------+  Summary:  Left: No evidence of deep vein thrombosis in the upper extremity.  *See table(s) above for measurements and observations.  Diagnosing physician: Ruta Hinds MD Electronically signed by Ruta Hinds MD on 10/21/2019 at 1:47:13 PM.    Final    No results for input(s): WBC, HGB, HCT, PLT in the last 72 hours. Recent Labs    10/21/19 0535 10/22/19 0527  NA 140 138  K 3.2* 3.2*  CL 104 104  CO2 25 24  GLUCOSE 99 96  BUN 39* 39*  CREATININE 4.02* 3.81*  CALCIUM 10.3 10.2    Intake/Output Summary (Last 24 hours) at 10/22/2019 0841 Last data filed at 10/22/2019 0811 Gross per 24 hour  Intake 510 ml  Output 630 ml  Net -120 ml     Physical Exam: Vital Signs Blood pressure 123/70, pulse (!) 105, temperature 98.4 F (36.9 C), resp. rate 18, height 5\' 10"  (1.778 m), weight 81 kg, SpO2 95 %. Constitutional: No distress . Vital signs reviewed. HEENT: EOMI, oral membranes moist Neck: supple Cardiovascular: tachy 100-110 without murmur. No JVD    Respiratory: CTA Bilaterally without wheezes or rales. Normal effort    GI: BS +, non-tender, non-distended  Skin: Warm and dry.  Intact. Psych: extremely pleasant Musc: No edema in extremities.  No tenderness in extremities. Neuro: Alert Dysphonia, unchanged  Motor: RUE/RLE: 4/5 proximal distal LUE: Shoulder abduction, elbow flexion/extension 2/5, handgrip  0/5--stable exam  LLE: Hip flexion, knee extension 4-/5, ankle dorsiflexion 0/5---stable  Assessment/Plan: 1. Functional deficits secondary to Left hemiparesis from RIght parietal infarct   which require 3+ hours per day of interdisciplinary therapy in a comprehensive inpatient rehab setting.  Physiatrist is providing close team supervision and 24 hour management of active medical problems listed below.  Physiatrist and rehab team continue to assess barriers to discharge/monitor patient progress toward functional and medical goals  Care Tool:  Bathing    Body parts bathed by patient: Chest, Abdomen, Front perineal area, Right upper leg, Left upper leg, Face   Body parts bathed by helper: Buttocks, Right lower leg, Left lower leg     Bathing assist Assist Level: Maximal Assistance - Patient 24 - 49%     Upper Body Dressing/Undressing Upper body dressing   What is the patient wearing?: Pull over shirt    Upper body assist Assist Level: Maximal Assistance - Patient 25 - 49%    Lower Body Dressing/Undressing Lower body dressing      What is the patient wearing?: Pants     Lower body assist Assist for lower body dressing: Total Assistance - Patient < 25%     Toileting Toileting Toileting Activity did not occur Landscape architect and hygiene only): N/A (no void or bm)  Toileting assist Assist for toileting: 2 Helpers Assistive Device Comment: urinal    Transfers Chair/bed transfer  Transfers assist  Chair/bed transfer activity did not occur: Safety/medical concerns  Chair/bed transfer assist level: Maximal Assistance - Patient 25 - 49%     Locomotion Ambulation   Ambulation assist   Ambulation activity did not occur: Safety/medical concerns  Assist level: 2 helpers Assistive device: Other (comment)(rail in hall) Max distance: 4   Walk 10 feet activity   Assist  Walk 10 feet activity did not occur: Safety/medical concerns        Walk 50 feet  activity   Assist Walk 50 feet with 2 turns activity did not occur: Safety/medical concerns  Walk 150 feet activity   Assist Walk 150 feet activity did not occur: Safety/medical concerns         Walk 10 feet on uneven surface  activity   Assist Walk 10 feet on uneven surfaces activity did not occur: Safety/medical concerns         Wheelchair     Assist   Type of Wheelchair: Manual    Wheelchair assist level: Minimal Assistance - Patient > 75% Max wheelchair distance: 100    Wheelchair 50 feet with 2 turns activity    Assist        Assist Level: Minimal Assistance - Patient > 75%   Wheelchair 150 feet activity     Assist      Assist Level: Dependent - Patient 0%   Blood pressure 123/70, pulse (!) 105, temperature 98.4 F (36.9 C), resp. rate 18, height 5\' 10"  (1.778 m), weight 81 kg, SpO2 95 %.  Medical Problem List and Plan: 1.  Debility secondary to acute hypoxemic RIght parietal infarct with Left hemiparesis LE>UE   Continue CIR- PT, OT, SLP, extended stay by 7 d given potential to exceed original goals and reduce care needs  2.  Antithrombotics: -DVT/anticoagulation: Subcutaneous heparin. Negative  vascular study             -antiplatelet therapy: Aspirin 81 mg daily 3. Pain Management: Hycet as needed,some sacral pain but not severe   Relatively controlled on 12/28  12/30: Will add Lidocaine patch to left shoulder 4. Mood: Provide emotional support. Rozerem 8 mg nightly             -antipsychotic agents: N/A 5. Neuropsych: This patient is capable of making decisions on his own behalf.             SLP recommends additional diagnostics of higher level cognitive skills.  6. Skin/Wound Care: Wound care sacral decubitus with tunneling. , cont mattress overlay, sidelying recommended   -pack Aquacel Ag+ twice daily for WOC  -woc rn consulted- apreciate surgery input, no I and D recommended discussed with Pharmacy, expanding  antibiotic coverage to include bacteroides fragilis    -nutrition, turning, prevalon boots for heels   7. Fluids/Electrolytes/Nutrition: Routine in and outs 8.  Anemia/melena.  Continue Aranesp  Hgb 8.3 on 12/22 9.  Tracheostomy 08/22/2019.  Decannulated.  Diet advanced to regular.  Continue nebulizers as directed 10.  MSSA bacteremia/pneumonia.  Antibiotic therapy completed 11.  Hyperlipidemia.  Lipitor 12.  Post stroke oropharyngeal dysphagia: Appreciate speech therapy recommendations; regular diet with oral care BID  -MMW for thrush 13.  Renal failure after sepsis- on HD per Nephro - tent d/c 1/7 will need to make plans for OP HD- creat improving   First stage left basilic vein transposition 12/21  12/27: Appreciate nephrology follow-up. Daily labs for potential renal recovery.  14.  Infected sacral decub- no I and D per gen surg   -afebrile  WBCs 7.4 on 12/24  - serratia marcesens growing from wound S to ceftriaxone  -Bacteroides growing out as well but no susceptibility reported as rec per Pharm,   on metronidazole for anaerobic coverage   Continue to monitor 15.  Sleep disturbance no current c/os  Melatonin started on 12/20 16. Constipation:  12/26: Continue senna-docusate BID. Made Miralax standing.    -large bm 12/27  12/29: No BM since 12/27 and patient feels constipated. Will make Miralax BID. LOS: 19 days A FACE TO FACE EVALUATION WAS PERFORMED  Clide Deutscher Esma Kilts 10/22/2019, 8:41  AM

## 2019-10-22 NOTE — Progress Notes (Signed)
Speech Language Pathology Daily Session Note  Patient Details  Name: Lamin Chandley MRN: [id number] Date of Birth: 17-Feb-1951  Today's Date: 10/22/2019 SLP Individual Time: 1403-1500 SLP Individual Time Calculation (min): 57 min  Short Term Goals: Week 3: SLP Short Term Goal 1 (Week 3): Patient will complete higher level reasoning and executive functioning tasks with 90% accuracy and min cues. SLP Short Term Goal 2 (Week 3): Patient will state and demonstrate compensatory cognitive-linguistic strategies as trained with 90% accuracy and min cues. SLP Short Term Goal 3 (Week 3): Patient will complete delayed recall tasks for 5 stimuil post ~5 minute delay with 80% accuracy and supervision A verbal cues.  Skilled Therapeutic Interventions: Skilled ST services focused on speech skills. SLP facilitated assessment of RMT to increase vocal intensity in conversation. Pt returned demonstration of EMST and IMST devices, EMST set at 10 cm H20 and IMST set at 25 cm H20, Pt completed 10 repetition x2 for each with self-perceived effort score for 6-8 out 10. SLP also facilitated executive function and recall strategies in familiar deductive reasoning tasks, pt required mod A fade to min A verbal cues for recall and organization within task. Pt was left in room with call bell within reach and bed alarm set. ST recommends to continue skilled ST services.      Pain Pain Assessment Pain Score: 0-No pain  Therapy/Group: Individual Therapy  Trent Gabler  [facility] 10/22/2019, 4:33 PM

## 2019-10-23 ENCOUNTER — Inpatient Hospital Stay (HOSPITAL_COMMUNITY): Payer: Medicare HMO

## 2019-10-23 ENCOUNTER — Inpatient Hospital Stay (HOSPITAL_COMMUNITY): Payer: Medicare HMO | Admitting: Occupational Therapy

## 2019-10-23 ENCOUNTER — Inpatient Hospital Stay (HOSPITAL_COMMUNITY): Payer: Medicare HMO | Admitting: Physical Therapy

## 2019-10-23 LAB — BASIC METABOLIC PANEL
Anion gap: 11 (ref 5–15)
BUN: 39 mg/dL — ABNORMAL HIGH (ref 8–23)
CO2: 23 mmol/L (ref 22–32)
Calcium: 10.3 mg/dL (ref 8.9–10.3)
Chloride: 105 mmol/L (ref 98–111)
Creatinine, Ser: 3.78 mg/dL — ABNORMAL HIGH (ref 0.61–1.24)
GFR calc Af Amer: 18 mL/min — ABNORMAL LOW (ref >60)
GFR calc non Af Amer: 16 mL/min — ABNORMAL LOW (ref >60)
Glucose, Bld: 93 mg/dL (ref 70–99)
Potassium: 3.4 mmol/L — ABNORMAL LOW (ref 3.5–5.1)
Sodium: 139 mmol/L (ref 135–145)

## 2019-10-23 NOTE — Progress Notes (Signed)
@  2140, pt refused HS meds. RN asked why and pt said he didn't want them and didn't feel like he needed them. RN educated pt on need to take meds and potential complications, but pt adamant on decision.  @0330 , pt stated he's ready to take meds he refused earlier that night. RN explained to pt that those meds were prescribed at that time and RN unable to administer said medications at this time. RN explained importance of meds compliance at the times they are prescribed and pt agreed to take them at those times. Pt stated he was tired when RN tried to administer meds at scheduled times.  Pt resting, will continue to monitor

## 2019-10-23 NOTE — Progress Notes (Signed)
[facility]  Progress Note   Assessment/ Plan:   1. Acute renal failure/ AKI: had ATN/ AKI from COVID-related septic shock, started CRRT on 08/07/19 and converted to iHD on 08/26/19. He is now making urine and HD is on hold, last HD was 12/24. Making urine. Today creat decreased for 1st time on it's own from peak 4.0 yest to 3.7 today. Seems to be recovering renal function. Will cont to hold HD.  2. COVID-19 pneumonia with hypoxic respiratory failure and prolonged hospitalization/complications: Status post decannulation of tracheostomy and transfer to CIR. He continues making gradual progress with rehabilitation with good oxygenation  3. Anemia: Without overt loss, continue Aranesp for anemia of critical illness. With iron deficiency getting intravenous iron  4. CKD-MBD: With borderline elevated calcium levels when corrected for albumin, low phosphorus  5. Nutrition: Continue current renal diet with oral protein supplementation to promote healing of sacral decubitus  6. Hypertension: Euvolemic on physical exam, blood pressures acceptable.  7. Sacral decubitus: Ongoing aggressive wound management by wound care/surgery. On oral metronidazole.   [name], MD  10/22/2019, 9:34 AM   Subjective:   Creat down to 3.7 today, good UOP   Objective:   BP 140/76 (BP Location: Right Arm)  Pulse 100  Temp 98.3 F (36.8 C) (Oral)  Resp 18  Ht 5\' 10"  (1.778 m)  Wt 81 kg  SpO2 96%  BMI 25.62 kg/m  Physical Exam:  Gen: seen in room, up in [facility]  CVS: Pulse regular tachycardia, S1 and S2 normal  Resp: Clear to auscultation bilaterally, no rales/rhonchi  Abd: Soft, obese, nontender  Ext: No lower extremity edema    Medications:    . aspirin EC 81 mg Oral Daily  . atorvastatin 40 mg Oral q1800  . budesonide (PULMICORT) nebulizer solution 0.5 mg Nebulization BID  . chlorhexidine 15 mL Mouth/Throat BID  . Chlorhexidine Gluconate Cloth 6 each Topical BID  . darbepoetin (ARANESP)  injection - DIALYSIS 150 mcg Intravenous Q Sat-HD  . diclofenac Sodium 2 g Topical QID  . feeding supplement (NEPRO CARB STEADY) 237 mL Oral Q24H  . feeding supplement (PRO-STAT SUGAR FREE 64) 30 mL Oral BID  . heparin 5,000 Units Subcutaneous Q8H  . hydrocerin  Topical TID  . magic mouthwash w/lidocaine 5 mL Oral QID  . metroNIDAZOLE 500 mg Oral Q8H  . multivitamin 1 tablet Oral QHS  . pantoprazole 40 mg Oral Q1200  . polyethylene glycol 17 g Oral BID  . ramelteon 8 mg Oral QHS  . senna-docusate 2 tablet Oral BID  . sucralfate 1 g Oral Q6H

## 2019-10-23 NOTE — Progress Notes (Signed)
Speech Language Pathology Daily Session Note  Patient Details  Name: Whitfield Dulay MRN: [id number] Date of Birth: December 24, 1950  Today's Date: 10/23/2019 SLP Individual Time: 1003-1100 SLP Individual Time Calculation (min): 57 min  Short Term Goals: Week 3: SLP Short Term Goal 1 (Week 3): Patient will complete higher level reasoning and executive functioning tasks with 90% accuracy and min cues. SLP Short Term Goal 2 (Week 3): Patient will state and demonstrate compensatory cognitive-linguistic strategies as trained with 90% accuracy and min cues. SLP Short Term Goal 3 (Week 3): Patient will complete delayed recall tasks for 5 stimuil post ~5 minute delay with 80% accuracy and supervision A verbal cues.  Skilled Therapeutic Interventions: Skilled ST services focused on cognitive skills. Pt demonstrated use of EMST and IMST 20 repetitions x 2 with self-perceived effortful of 8 out 10. SLP facilitated complex problem solving and recall with aid skills in QID familiar medication management task, pt required supervision A verbal cues. Pt was left in room with call bell within reach and chair alarm set. ST recommends to continue skilled ST services.      Pain Pain Assessment Pain Score: 0-No pain  Therapy/Group: Individual Therapy  Jadea Shiffer  [facility] 10/23/2019, 3:59 PM

## 2019-10-23 NOTE — Progress Notes (Signed)
[name] PHYSICAL MEDICINE & REHABILITATION PROGRESS NOTE   Subjective/Complaints: Has no complaints this morning. Working with therapy and thankful for care.  Has some left knee and shoulder pain and his heating pad helps. Lidocaine patch has also helped with shoulder pain.   ROS: Patient denies fever, rash, sore throat, blurred vision, nausea, vomiting, diarrhea, cough, chest pain, joint or back pain, headache, or mood change.   Objective:   No results found. No results for input(s): WBC, HGB, HCT, PLT in the last 72 hours. Recent Labs    10/22/19 0527 10/23/19 0600  NA 138 139  K 3.2* 3.4*  CL 104 105  CO2 24 23  GLUCOSE 96 93  BUN 39* 39*  CREATININE 3.81* 3.78*  CALCIUM 10.2 10.3    Intake/Output Summary (Last 24 hours) at 10/23/2019 0838 Last data filed at 10/23/2019 0133 Gross per 24 hour  Intake 772.71 ml  Output 400 ml  Net 372.71 ml     Physical Exam: Vital Signs Blood pressure 116/69, pulse (!) 104, temperature 98.5 F (36.9 C), temperature source Oral, resp. rate 17, height 5\' 10"  (1.778 m), weight 81 kg, SpO2 98 %. Constitutional: No distress . Vital signs reviewed. HEENT: EOMI, oral membranes moist Neck: supple Cardiovascular: tachy 100-110 without murmur. No JVD    Respiratory: CTA Bilaterally without wheezes or rales. Normal effort    GI: BS +, non-tender, non-distended  Skin: Warm and dry.  Intact. Psych: extremely pleasant Musc: No edema in extremities.  No tenderness in extremities. Neuro: Alert Dysphonia, unchanged  Motor: RUE/RLE: 4/5 proximal distal LUE: Shoulder abduction, elbow flexion/extension 3/5, handgrip 0/5--stable exam  LLE: Hip flexion, knee extension 4-/5, ankle dorsiflexion 0/5---stable  Assessment/Plan: 1. Functional deficits secondary to Left hemiparesis from RIght parietal infarct   which require 3+ hours per day of interdisciplinary therapy in a comprehensive inpatient rehab setting.  Physiatrist is providing close  team supervision and 24 hour management of active medical problems listed below.  Physiatrist and rehab team continue to assess barriers to discharge/monitor patient progress toward functional and medical goals  Care Tool:  Bathing    Body parts bathed by patient: Chest, Abdomen, Front perineal area, Right upper leg, Left upper leg, Face, Left arm   Body parts bathed by helper: Right arm, Buttocks, Right lower leg, Left lower leg     Bathing assist Assist Level: Moderate Assistance - Patient 50 - 74%     Upper Body Dressing/Undressing Upper body dressing   What is the patient wearing?: Pull over shirt    Upper body assist Assist Level: Maximal Assistance - Patient 25 - 49%    Lower Body Dressing/Undressing Lower body dressing      What is the patient wearing?: Pants     Lower body assist Assist for lower body dressing: Total Assistance - Patient < 25%     Toileting Toileting Toileting Activity did not occur Landscape architect and hygiene only): N/A (no void or bm)  Toileting assist Assist for toileting: 2 Helpers Assistive Device Comment: urinal    Transfers Chair/bed transfer  Transfers assist  Chair/bed transfer activity did not occur: Safety/medical concerns  Chair/bed transfer assist level: Maximal Assistance - Patient 25 - 49%     Locomotion Ambulation   Ambulation assist   Ambulation activity did not occur: Safety/medical concerns  Assist level: 2 helpers Assistive device: Other (comment)(rail in hall) Max distance: 4   Walk 10 feet activity   Assist  Walk 10 feet activity did not occur:  Safety/medical concerns        Walk 50 feet activity   Assist Walk 50 feet with 2 turns activity did not occur: Safety/medical concerns         Walk 150 feet activity   Assist Walk 150 feet activity did not occur: Safety/medical concerns         Walk 10 feet on uneven surface  activity   Assist Walk 10 feet on uneven surfaces activity  did not occur: Safety/medical concerns         Wheelchair     Assist   Type of Wheelchair: Manual    Wheelchair assist level: Minimal Assistance - Patient > 75% Max wheelchair distance: 100    Wheelchair 50 feet with 2 turns activity    Assist        Assist Level: Minimal Assistance - Patient > 75%   Wheelchair 150 feet activity     Assist      Assist Level: Dependent - Patient 0%   Blood pressure 116/69, pulse (!) 104, temperature 98.5 F (36.9 C), temperature source Oral, resp. rate 17, height 5\' 10"  (1.778 m), weight 81 kg, SpO2 98 %.  Medical Problem List and Plan: 1.  Debility secondary to acute hypoxemic RIght parietal infarct with Left hemiparesis LE>UE   Continue CIR- PT, OT, SLP, extended stay by 7 d given potential to exceed original goals and reduce care needs  2.  Antithrombotics: -DVT/anticoagulation: Subcutaneous heparin. Negative  vascular study             -antiplatelet therapy: Aspirin 81 mg daily 3. Pain Management: Hycet as needed,some sacral pain but not severe   Relatively controlled on 12/28  12/30: Will add Lidocaine patch to left shoulder  12/31: better controlled. Added left knee neoprene sleeve for comfort and support; will hopefully be fitted by ortho tech today.  4. Mood: Provide emotional support. Rozerem 8 mg nightly             -antipsychotic agents: N/A 5. Neuropsych: This patient is capable of making decisions on his own behalf.             SLP recommends additional diagnostics of higher level cognitive skills.  6. Skin/Wound Care: Wound care sacral decubitus with tunneling. , cont mattress overlay, sidelying recommended   -pack Aquacel Ag+ twice daily for WOC  -woc rn consulted- apreciate surgery input, no I and D recommended discussed with Pharmacy, expanding antibiotic coverage to include bacteroides fragilis    -nutrition, turning, prevalon boots for heels   7. Fluids/Electrolytes/Nutrition: Routine in and outs 8.   Anemia/melena.  Continue Aranesp  Hgb 8.3 on 12/22 9.  Tracheostomy 08/22/2019.  Decannulated.  Diet advanced to regular.  Continue nebulizers as directed 10.  MSSA bacteremia/pneumonia.  Antibiotic therapy completed 11.  Hyperlipidemia.  Lipitor 12.  Post stroke oropharyngeal dysphagia: Appreciate speech therapy recommendations; regular diet with oral care BID  -MMW for thrush 13.  Renal failure after sepsis- on HD per Nephro - tent d/c 1/7 will need to make plans for OP HD- creat improving   First stage left basilic vein transposition 12/21  12/27: Appreciate nephrology follow-up. Daily labs for potential renal recovery.  14.  Infected sacral decub- no I and D per gen surg   -afebrile  WBCs 7.4 on 12/24  - serratia marcesens growing from wound S to ceftriaxone  -Bacteroides growing out as well but no susceptibility reported as rec per Pharm,   on metronidazole for anaerobic coverage  Continue to monitor 15.  Sleep disturbance no current c/os  Melatonin started on 12/20 16. Constipation:  12/26: Continue senna-docusate BID. Made Miralax standing.    -large bm 12/27  12/29: No BM since 12/27 and patient feels constipated. Will make Miralax BID.  12/31: Hypokalemia. 3.4 today. On K+ supplement. LOS: 20 days A FACE TO FACE EVALUATION WAS PERFORMED  Clide Deutscher Franchesca Veneziano 10/23/2019, 8:38 AM

## 2019-10-23 NOTE — Progress Notes (Signed)
Physical Therapy Session Note  Patient Details  Name: [name] MRN: [id number] Date of Birth: 08/24/1951  Today's Date: 10/23/2019 PT Individual Time: 0805-0905 PT Individual Time Calculation (min): 60 min   Short Term Goals: Week 3:  PT Short Term Goal 1 (Week 3): Pt will transfer to and from bed with mod assist and LRAD with lateral scoot/squat pivot PT Short Term Goal 2 (Week 3): Pt will ambulate >15ft with max assist +2 and LRAD PT Short Term Goal 3 (Week 3): Pt will propell WC >100ft with supervision assist. PT Short Term Goal 4 (Week 3): pt will maintain sitting balance EOB with supervision assist for improved independence with ADLs PT Short Term Goal 5 (Week 3): Pt will tolerate standing up to 1 min  with mod assist and LRAD  Skilled Therapeutic Interventions/Progress Updates:   Pt received supine in bed and agreeable to PT. Supine>sit transfer with min assist and cues for use of LUE and sequencing to improve success.   Donning pants sitting EOB with max assist using lateral lean with min assist for LUE placement, and max assist for clothing managmenet .   Squat pivot transfer to and from  WC with mod assist x 3 throughout treatment. Mod-max cues for improved anterior weight shift, proper UE placement and increased anterior weight shift.     Sit<>stand at rail in hall with min assist from PT with proximal pressure through the LLE to force WB. Gait training at rail with max A +2 for WC followg, 4 ft, 8ft x2. Max cues for sequencing, posture, step legnth, and attention to the LLE to activate knee extension in stance. .   Sit<>stand from mat table at elevated height with RW x 3 and mod assist for safety and improved sequencing. Patient returned to room and left sitting in WC with call bell in reach and all needs met.        Therapy Documentation Precautions:  Precautions Precautions: Fall Precaution Comments: Sacral decubitus wounds, Lt hemi Restrictions Weight Bearing  Restrictions: No    Vital Signs: Therapy Vitals Temp: 98.5 F (36.9 C) Temp Source: Oral Pulse Rate: (!) 104 Resp: 17 BP: 116/69 Patient Position (if appropriate): Lying Oxygen Therapy SpO2: 98 % O2 Device: Room Air Pain: denies   Therapy/Group: Individual Therapy   E  10/23/2019, 9:10 AM  

## 2019-10-23 NOTE — Progress Notes (Signed)
Occupational Therapy Session Note  Patient Details  Name: [name] MRN: [id number] Date of Birth: [birth year]  Today's Date: 10/23/2019 OT Individual Time: 1100-1200 OT Individual Time Calculation (min): 60 min    Short Term Goals: Week 3:  OT Short Term Goal 1 (Week 3): Pt will complete BSC or toilet transfer with 1 assist using LRAD OT Short Term Goal 2 (Week 3): Pt will complete 2 grooming tasks EOB with no more than Min A for balance OT Short Term Goal 3 (Week 3): Pt will complete UB dressing with Mod A  Skilled Therapeutic Interventions/Progress Updates:    Upon entering the room, pt seated in wheelchair with c/o pain in buttocks. He states, " I've been sitting in the chair for many hours." Pt was agreeable to OT intervention and very motivated for session. Session with focus on L UE NMR for AROM/AAROM in all planes of movement x 10 reps each. Pt also able to perform opposition today of each digit with thumb. Slide board transfer with mod A into bed and sit >supine with max A. Bed placed into trendelenburg and pt repositioned himself. PNF movement patterns from supine for increased success with gravity eliminated position. Pt verbalizing need to use urinal and OT provided pt with male urinal to increase independence with task. Call bell and all needed items within reach upon exiting the room.   Therapy Documentation Precautions:  Precautions Precautions: Fall Precaution Comments: Sacral decubitus wounds, Lt hemi Restrictions Weight Bearing Restrictions: No General:   Vital Signs: Therapy Vitals Temp: 98.6 F (37 C) Temp Source: Oral Pulse Rate: (!) 108 Resp: 18 BP: 138/74 Patient Position (if appropriate): Lying Oxygen Therapy SpO2: 100 % O2 Device: Room Air Pain: Pain Assessment Pain Score: 0-No pain ADL: ADL Eating: Maximal assistance(for sitting balance) Where Assessed-Eating: Edge of bed Grooming: Maximal assistance Where Assessed-Grooming: Edge of  bed Upper Body Bathing: Maximal assistance Where Assessed-Upper Body Bathing: Edge of bed Lower Body Bathing: Dependent(+2 assist) Where Assessed-Lower Body Bathing: Bed level Upper Body Dressing: Dependent Where Assessed-Upper Body Dressing: Edge of bed Lower Body Dressing: Dependent(+2 assist) Where Assessed-Lower Body Dressing: Bed level Toileting: Not assessed Toilet Transfer: Not assessed Tub/Shower Transfer: Not assessed   Therapy/Group: Individual Therapy  Gypsy Decant 10/23/2019, 4:25 PM

## 2019-10-24 ENCOUNTER — Inpatient Hospital Stay (HOSPITAL_COMMUNITY): Payer: Medicare HMO | Admitting: Physical Therapy

## 2019-10-24 NOTE — Progress Notes (Signed)
Physical Therapy Weekly Progress Note  Patient Details  Name: [name] MRN: [id number] Date of Birth: [birth year]  Beginning of progress report period: October 16, 2019 End of progress report period: October 24, 2019  Today's Date: 10/24/2019 PT Individual Time: 1105-1205 PT Individual Time Calculation (min): 60 min   Patient has met 3 of 5 short term goals.  Pt is making steady progress towards LTG. Pt has progressed to min assist bed mobility and SB transfers, mod assist squat pivot transfers and sit<>stand. Supervision assist-min assist WC up to 143f with cues for sequencing and technique. Pt has also initiated gait training using rial in hall with max assist + 2 for LLE control and WC follow for safety up to 8 ft. Pt continues to be limited by mild L inattention, poor initiation, L Hemiplegia and general deconditioning 2/2 prolonged hospitalization.   Patient continues to demonstrate the following deficits muscle weakness, muscle joint tightness and muscle paralysis, decreased cardiorespiratoy endurance, impaired timing and sequencing, abnormal tone, unbalanced muscle activation, motor apraxia and decreased coordination, decreased attention to left and ideational apraxia, decreased attention, decreased awareness, decreased problem solving and decreased memory and decreased sitting balance, decreased standing balance, decreased postural control, hemiplegia and decreased balance strategies and therefore will continue to benefit from skilled PT intervention to increase functional independence with mobility.  Patient progressing toward long term goals..  Continue plan of care.  PT Short Term Goals Week 3:  PT Short Term Goal 1 (Week 3): Pt will transfer to and from bed with mod assist and LRAD with lateral scoot/squat pivot PT Short Term Goal 1 - Progress (Week 3): Met PT Short Term Goal 2 (Week 3): Pt will ambulate >169fwith max assist +2 and LRAD PT Short Term Goal 2 - Progress (Week 3):  Progressing toward goal PT Short Term Goal 3 (Week 3): Pt will propell WC >10021fith supervision assist. PT Short Term Goal 3 - Progress (Week 3): Met PT Short Term Goal 4 (Week 3): pt will maintain sitting balance EOB with supervision assist for improved independence with ADLs PT Short Term Goal 4 - Progress (Week 3): Met PT Short Term Goal 5 (Week 3): Pt will tolerate standing up to 1 min  with mod assist and LRAD PT Short Term Goal 5 - Progress (Week 3): Progressing toward goal Week 4:  PT Short Term Goal 1 (Week 4): pt [facility] and [facility] assist consistantly with LRAD PT Short Term Goal 2 (Week 4): Pt will ambulate [id number] max assist + 2 for WC follow PT Short Term Goal 3 (Week 4): Pt will propell WC >150ft51fh supervision assist PT Short Term Goal 4 (Week 4): Pt will tolerate standing up to 1 minute with mod assist  Skilled Therapeutic Interventions/Progress Updates:   Pt received supine in bed and agreeable to PT. Supine>sit transfer with min assist for improved LUE positioning to force use with transfer. Sitting balance EOB for PT to don pants. To knee. Sit<>stand in steady to pull pants to waist with min assist from elevated surface. stedy transfer to WC [facility] min assist. PT assisted pt to don shoes once in WC.  WC mobility through hall with supervision assist initially fading to min assist with fatigue to maintian straight path and avoid obstacles on the L. Max cues for posture, sequecning, and technique to prevent constant veer to the L in WC.   SB transfer x 2 to and [facility] min assist from  for safety and to improve lateral movement pof pelvis and improved sequencing  of movement.   Sit<>stand from elevated mat table with LUE on RW x 6 with mod assist overall. Moderate cues for improved use of RUE to push from mat table. Pregait stepping task 2 x 2 BLE with LLE blocked into extension. Pt able to demonstrate improved confidence in the LLE on second bout with  improved weight shift as well as quad and glute activation.   Patient returned to room and left sitting in [facility] with call bell in reach and all needs met.         Therapy Documentation Precautions:  Precautions Precautions: Fall Precaution Comments: Sacral decubitus wounds, Lt hemi Restrictions Weight Bearing Restrictions: No  Pain:  denies   Therapy/Group: Individual Therapy  [name] [name] 10/24/2019, 12:48 PM

## 2019-10-24 NOTE — Progress Notes (Signed)
[facility] PHYSICAL MEDICINE & REHABILITATION PROGRESS NOTE   Subjective/Complaints: Has no complaints this morning. Working with therapy and thankful for care.  Lidocaine patch has also helped with shoulder pain. Knee sleeve has given comfort to knee.    ROS: Patient denies fever, rash, sore throat, blurred vision, nausea, vomiting, diarrhea, cough, chest pain, joint or back pain, headache, or mood change.   Objective:   No results found. No results for input(s): WBC, HGB, HCT, PLT in the last 72 hours. Recent Labs    10/22/19 0527 10/23/19 0600  NA 138 139  K 3.2* 3.4*  CL 104 105  CO2 24 23  GLUCOSE 96 93  BUN 39* 39*  CREATININE 3.81* 3.78*  CALCIUM 10.2 10.3    Intake/Output Summary (Last 24 hours) at 10/24/2019 0821 Last data filed at 10/24/2019 0600 Gross per 24 hour  Intake 820 ml  Output 200 ml  Net 620 ml     Physical Exam: Vital Signs Blood pressure (!) 144/64, pulse (!) 105, temperature 98.5 F (36.9 C), temperature source Oral, resp. rate 19, height 5\' 10"  (1.778 m), weight 81 kg, SpO2 100 %. Constitutional: No distress . Vital signs reviewed. HEENT: EOMI, oral membranes moist Neck: supple Cardiovascular: tachy 100-110 without murmur. No JVD    Respiratory: CTA Bilaterally without wheezes or rales. Normal effort    GI: BS +, non-tender, non-distended  Skin: Warm and dry.  Intact. Psych: extremely pleasant Musc: No edema in extremities.  No tenderness in extremities. Neuro: Alert Dysphonia, unchanged  Motor: RUE/RLE: 4/5 proximal distal LUE: Shoulder abduction, elbow flexion/extension 4-/5, handgrip 0/5--stable exam  LLE: Hip flexion, knee extension 4-/5, ankle dorsiflexion 0/5---stable  Assessment/Plan: 1. Functional deficits secondary to Left hemiparesis from RIght parietal infarct   which require 3+ hours per day of interdisciplinary therapy in a comprehensive inpatient rehab setting.  Physiatrist is providing close team supervision and 24 hour  management of active medical problems listed below.  Physiatrist and rehab team continue to assess barriers to discharge/monitor patient progress toward functional and medical goals  Care Tool:  Bathing    Body parts bathed by patient: Chest, Abdomen, Front perineal area, Right upper leg, Left upper leg, Face, Left arm   Body parts bathed by helper: Right arm, Buttocks, Right lower leg, Left lower leg     Bathing assist Assist Level: Moderate Assistance - Patient 50 - 74%     Upper Body Dressing/Undressing Upper body dressing   What is the patient wearing?: Pull over shirt    Upper body assist Assist Level: Maximal Assistance - Patient 25 - 49%    Lower Body Dressing/Undressing Lower body dressing      What is the patient wearing?: Pants     Lower body assist Assist for lower body dressing: Total Assistance - Patient < 25%     Toileting Toileting Toileting Activity did not occur Landscape architect and hygiene only): N/A (no void or bm)  Toileting assist Assist for toileting: 2 Helpers Assistive Device Comment: urinal    Transfers Chair/bed transfer  Transfers assist  Chair/bed transfer activity did not occur: Safety/medical concerns  Chair/bed transfer assist level: Maximal Assistance - Patient 25 - 49%     Locomotion Ambulation   Ambulation assist   Ambulation activity did not occur: Safety/medical concerns  Assist level: 2 helpers Assistive device: Other (comment)(rail in hall) Max distance: 4   Walk 10 feet activity   Assist  Walk 10 feet activity did not occur: Safety/medical concerns  Walk 50 feet activity   Assist Walk 50 feet with 2 turns activity did not occur: Safety/medical concerns         Walk 150 feet activity   Assist Walk 150 feet activity did not occur: Safety/medical concerns         Walk 10 feet on uneven surface  activity   Assist Walk 10 feet on uneven surfaces activity did not occur: Safety/medical  concerns         Wheelchair     Assist   Type of Wheelchair: Manual    Wheelchair assist level: Minimal Assistance - Patient > 75% Max wheelchair distance: 100    Wheelchair 50 feet with 2 turns activity    Assist        Assist Level: Minimal Assistance - Patient > 75%   Wheelchair 150 feet activity     Assist      Assist Level: Dependent - Patient 0%   Blood pressure (!) 144/64, pulse (!) 105, temperature 98.5 F (36.9 C), temperature source Oral, resp. rate 19, height 5\' 10"  (1.778 m), weight 81 kg, SpO2 100 %.  Medical Problem List and Plan: 1.  Debility secondary to acute hypoxemic RIght parietal infarct with Left hemiparesis LE>UE   Continue CIR- PT, OT, SLP, extended stay by 7 d given potential to exceed original goals and reduce care needs  2.  Antithrombotics: -DVT/anticoagulation: Subcutaneous heparin. Negative  vascular study             -antiplatelet therapy: Aspirin 81 mg daily 3. Pain Management: Hycet as needed,some sacral pain but not severe   Relatively controlled on 12/28  12/30: Will add Lidocaine patch to left shoulder  12/31: better controlled. Added left knee neoprene sleeve for comfort and support; will hopefully be fitted by ortho tech today.   1/1: well controlled.  4. Mood: Provide emotional support. Rozerem 8 mg nightly             -antipsychotic agents: N/A 5. Neuropsych: This patient is capable of making decisions on his own behalf.             SLP recommends additional diagnostics of higher level cognitive skills.  6. Skin/Wound Care: Wound care sacral decubitus with tunneling. , cont mattress overlay, sidelying recommended   -pack Aquacel Ag+ twice daily for WOC  -woc rn consulted- apreciate surgery input, no I and D recommended discussed with Pharmacy, expanding antibiotic coverage to include bacteroides fragilis    -nutrition, turning, prevalon boots for heels   7. Fluids/Electrolytes/Nutrition: Routine in and outs 8.   Anemia/melena.  Continue Aranesp  Hgb 8.3 on 12/22 9.  Tracheostomy 08/22/2019.  Decannulated.  Diet advanced to regular.  Continue nebulizers as directed 10.  MSSA bacteremia/pneumonia.  Antibiotic therapy completed 11.  Hyperlipidemia.  Lipitor 12.  Post stroke oropharyngeal dysphagia: Appreciate speech therapy recommendations; regular diet with oral care BID  -MMW for thrush 13.  Renal failure after sepsis- on HD per Nephro - tent d/c 1/7 will need to make plans for OP HD- creat improving   First stage left basilic vein transposition 12/21  12/27, 12/31: [facility] nephrology follow-up 14.  Infected sacral decub- no I and D per gen surg   -afebrile  WBCs 7.4 on 12/24  - serratia marcesens growing from wound S to ceftriaxone  -Bacteroides growing out as well but no susceptibility reported as rec per Pharm,   on metronidazole for anaerobic coverage   Continue to monitor 15.  Sleep disturbance no  current c/os  Melatonin started on 12/20 16. Constipation:  12/26: Continue senna-docusate BID. Made Miralax standing.    -large bm 12/27  12/29: No BM since 12/27 and patient feels constipated. Will make Miralax BID.  1/1: Needs to have BM now. 17. Hypokalemia. 12/31: 3.4 today. On K+ supplement. 41. Education: educated regarding the importance of taking medications at instructed times.  LOS: 21 days A FACE TO FACE EVALUATION WAS PERFORMED  Harjas Biggins P Maybree Riling 10/24/2019, 8:21 AM

## 2019-10-24 NOTE — Progress Notes (Signed)
[facility] Progress Note  Subjective: no c/o today, no n/v or confusion. No labs today  Vitals:   10/23/19 1952 10/23/19 2008 10/24/19 0332 10/24/19 0832  BP: 135/70  (!) 144/64   Pulse: (!) 107  (!) 105   Resp: 18  19   Temp: 98.5 F (36.9 C)  98.5 F (36.9 C)   TempSrc:   Oral   SpO2: 100% 97% 100% 98%  Height:        Exam: Gen: seen in room, in bed, calm and pleasant CVS: Pulse regular tachycardia, S1 and S2 normal  Resp: Clear to auscultation bilaterally, no rales/rhonchi  Abd: Soft, obese, nontender  Ext: No lower extremity edema   Assessment/ Plan: 1. Acute renal failure/ AKI: had ATN/ AKI from COVID-related septic shock, started CRRT on 08/07/19 and converted to iHD on 08/26/19. He is now making urine and HD is on hold, last HD was 12/24. Creat peaked at 4.0 then dropped into mid 3's range. No labs today. Plan bmet in am. Will follow.  2. COVID-19 pneumonia with hypoxic respiratory failure and prolonged hospitalization/complications: trach is out and pt doing better, on rehab.  3. Anemia: Without overt loss, continue Aranesp for anemia of critical illness. With iron deficiency getting intravenous iron  4. CKD-MBD: With borderline elevated calcium levels when corrected for albumin, low phosphorus  5. Nutrition: Continue current renal diet with oral protein supplementation to promote healing of sacral decubitus  6. Hypertension: Euvolemic on physical exam, blood pressures acceptable.  7. Sacral decubitus: Ongoing aggressive wound management by wound care/surgery  [name] 10/24/2019, 12:44 PM  Inpatient medications: . aspirin EC  81 mg Oral Daily  . atorvastatin  40 mg Oral q1800  . budesonide (PULMICORT) nebulizer solution  0.5 mg Nebulization BID  . chlorhexidine  15 mL Mouth/Throat BID  . Chlorhexidine Gluconate Cloth  6 each Topical BID  . darbepoetin (ARANESP) injection - DIALYSIS  150 mcg Intravenous Q Sat-HD  . diclofenac Sodium  2 g Topical QID   . feeding supplement (NEPRO CARB STEADY)  237 mL Oral Q24H  . feeding supplement (PRO-STAT SUGAR FREE 64)  30 mL Oral BID  . heparin  5,000 Units Subcutaneous Q8H  . hydrocerin   Topical TID  . lidocaine  1 patch Transdermal Q24H  . magic mouthwash w/lidocaine  5 mL Oral QID  . metroNIDAZOLE  500 mg Oral Q8H  . multivitamin  1 tablet Oral QHS  . pantoprazole  40 mg Oral Q1200  . polyethylene glycol  17 g Oral BID  . potassium chloride  20 mEq Oral Daily  . ramelteon  8 mg Oral QHS  . senna-docusate  2 tablet Oral BID  . sucralfate  1 g Oral Q6H   . sodium chloride 10 mL/hr at 10/23/19 1527  . cefTRIAXone (ROCEPHIN)  IV 1 g (10/24/19 0930)  . ferric gluconate (FERRLECIT/NULECIT) IV Stopped (10/23/19 1631)   sodium chloride, acetaminophen (TYLENOL) oral liquid 160 mg/5 mL, HYDROcodone-acetaminophen, ipratropium-albuterol, Melatonin, polyvinyl alcohol, sorbitol, white petrolatum

## 2019-10-24 NOTE — Progress Notes (Signed)
Pt's MEWS was 2 due to HR @ 120. Pulse rechecked and was 113; denies any pain/discomfort. Patient repositioned. He stated he is getting ready to sleep. Dr. Dagoberto Ligas informed.

## 2019-10-25 ENCOUNTER — Inpatient Hospital Stay (HOSPITAL_COMMUNITY): Payer: Medicare HMO | Admitting: Physical Therapy

## 2019-10-25 LAB — BASIC METABOLIC PANEL
Anion gap: 11 (ref 5–15)
BUN: 34 mg/dL — ABNORMAL HIGH (ref 8–23)
CO2: 22 mmol/L (ref 22–32)
Calcium: 10 mg/dL (ref 8.9–10.3)
Chloride: 107 mmol/L (ref 98–111)
Creatinine, Ser: 3.07 mg/dL — ABNORMAL HIGH (ref 0.61–1.24)
GFR calc Af Amer: 23 mL/min — ABNORMAL LOW (ref >60)
GFR calc non Af Amer: 20 mL/min — ABNORMAL LOW (ref >60)
Glucose, Bld: 95 mg/dL (ref 70–99)
Potassium: 3.8 mmol/L (ref 3.5–5.1)
Sodium: 140 mmol/L (ref 135–145)

## 2019-10-25 MED ORDER — DARBEPOETIN ALFA 150 MCG/0.3ML IJ SOSY
150.0000 ug | PREFILLED_SYRINGE | Freq: Once | INTRAMUSCULAR | Status: AC
  Administered 2019-10-25: 150 ug via SUBCUTANEOUS
  Filled 2019-10-25: qty 0.3

## 2019-10-25 MED ORDER — DARBEPOETIN ALFA 150 MCG/0.3ML IJ SOSY: 150.0000 ug | PREFILLED_SYRINGE | INTRAMUSCULAR | Status: DC

## 2019-10-25 NOTE — Progress Notes (Signed)
[facility] Progress Note  Subjective: no c/o today, no n/v or confusion. Creat down 3.0 today  Vitals:   10/24/19 2103 10/24/19 2201 10/25/19 0529 10/25/19 0756  BP:   131/75   Pulse: (!) 113  (!) 110 (!) 104  Resp:   20 18  Temp:   98.4 F (36.9 C)   TempSrc:   Oral   SpO2:  98% 100% 99%  Height:        Exam: Gen: seen in room, in bed, calm and pleasant CVS: Pulse regular tachycardia, S1 and S2 normal  Resp: Clear to auscultation bilaterally, no rales/rhonchi  Abd: Soft, obese, nontender  Ext: No lower extremity edema   Assessment/ Plan: 1. Acute renal failure/ AKI: had ATN/ AKI from COVID-related septic shock, started CRRT on 08/07/19 and converted to iHD on 08/26/19. Then started making urine so HD was held. Last HD 12/24. Creat peaked at 4.0 on 12.29 and is down to 3.0 today. Appears to be recovering renal function. Cont to hold HD. Labs in am Monday.  2. COVID-19 pneumonia with hypoxic respiratory failure and prolonged hospitalization/complications: trach is out and pt doing better, on rehab.  3. Anemia: Without overt loss, continue Aranesp for anemia of critical illness. With iron deficiency getting intravenous iron  4. CKD-MBD: With borderline elevated calcium levels when corrected for albumin, low phosphorus  5. Nutrition: Continue current renal diet with oral protein supplementation to promote healing of sacral decubitus  6. Hypertension: Euvolemic on physical exam, blood pressures acceptable.  7. Sacral decubitus: Ongoing aggressive wound management by wound care/surgery  [name] 10/25/2019, 10:28 AM  Inpatient medications: . aspirin EC  81 mg Oral Daily  . atorvastatin  40 mg Oral q1800  . budesonide (PULMICORT) nebulizer solution  0.5 mg Nebulization BID  . chlorhexidine  15 mL Mouth/Throat BID  . Chlorhexidine Gluconate Cloth  6 each Topical BID  . darbepoetin (ARANESP) injection - DIALYSIS  150 mcg Intravenous Q Sat-HD  . diclofenac Sodium  2  g Topical QID  . feeding supplement (NEPRO CARB STEADY)  237 mL Oral Q24H  . feeding supplement (PRO-STAT SUGAR FREE 64)  30 mL Oral BID  . heparin  5,000 Units Subcutaneous Q8H  . hydrocerin   Topical TID  . lidocaine  1 patch Transdermal Q24H  . magic mouthwash w/lidocaine  5 mL Oral QID  . metroNIDAZOLE  500 mg Oral Q8H  . multivitamin  1 tablet Oral QHS  . pantoprazole  40 mg Oral Q1200  . polyethylene glycol  17 g Oral BID  . ramelteon  8 mg Oral QHS  . senna-docusate  2 tablet Oral BID  . sucralfate  1 g Oral Q6H   . sodium chloride 10 mL/hr at 10/23/19 1527  . cefTRIAXone (ROCEPHIN)  IV 1 g (10/25/19 3299)  . ferric gluconate (FERRLECIT/NULECIT) IV Stopped (10/23/19 1631)   sodium chloride, acetaminophen (TYLENOL) oral liquid 160 mg/5 mL, HYDROcodone-acetaminophen, ipratropium-albuterol, Melatonin, polyvinyl alcohol, sorbitol, white petrolatum

## 2019-10-25 NOTE — Progress Notes (Signed)
Physical Therapy Session Note  Patient Details  Name: [name] MRN: [id number] Date of Birth: Jan 11, 1951  Today's Date: 10/25/2019 PT Individual Time: 1035-1100 PT Individual Time Calculation (min): 25 min   Short Term Goals: Week 4:  PT Short Term Goal 1 (Week 4): pt will transfer to and from [facility] with min assist consistantly with LRAD PT Short Term Goal 2 (Week 4): Pt will ambulate 573f with max assist + 2 for WC follow PT Short Term Goal 3 (Week 4): Pt will propell WC >1531fwith supervision assist PT Short Term Goal 4 (Week 4): Pt will tolerate standing up to 1 minute with mod assist  Skilled Therapeutic Interventions/Progress Updates:   Pt received supine in bed and agreeable to PT. Supine>sit transfer with min assist at trunk and to improve positioning of the LUE to improve use of with push in to sitting. Sitting balance EOB while PT donned shoes; supervision assist for sitting balance. Sit<>stand from EOB with RW from elevated surface with mod-min assist and multimodal cues to improve push from sitting surface. Standing tolerance x 45 sec with each stand and cues for increased terminal knee extension on the L side. Side stepping at EOB [id number] mod assist and cues for improved hip extension to netral and improved terminal knee extension on the L.  Sit>supine completed with min assist to the LLE for improved hip flexion, and left supine in bed with call bell in reach and all needs met.        Therapy Documentation Precautions:  Precautions Precautions: Fall Precaution Comments: Sacral decubitus wounds, Lt hemi Restrictions Weight Bearing Restrictions: No    Vital Signs: Therapy Vitals Pulse Rate: (!) 104 Resp: 18 Patient Position (if appropriate): Lying Oxygen Therapy SpO2: 99 % O2 Device: Room Air Pain:   denies   Therapy/Group: Individual Therapy  AustLorie [name]/2021, 11:38 AM

## 2019-10-25 NOTE — Plan of Care (Signed)
  Problem: Consults Goal: RH STROKE PATIENT EDUCATION Description: See Patient Education module for education specifics  Outcome: Progressing   Problem: RH BOWEL ELIMINATION Goal: RH STG MANAGE BOWEL WITH ASSISTANCE Description: STG Manage Bowel with min/mod Assistance. Outcome: Progressing Goal: RH STG MANAGE BOWEL W/MEDICATION W/ASSISTANCE Description: STG Manage Bowel with Medication with mod I Assistance. Outcome: Progressing   Problem: RH BLADDER ELIMINATION Goal: RH STG MANAGE BLADDER WITH ASSISTANCE Description: STG Manage Bladder With min/mod Assistance Outcome: Progressing   Problem: RH SKIN INTEGRITY Goal: RH STG SKIN FREE OF INFECTION/BREAKDOWN Description: Patients skin will remain free from further infection or breakdown with mod assist. Outcome: Progressing Goal: RH STG MAINTAIN SKIN INTEGRITY WITH ASSISTANCE Description: STG Maintain Skin Integrity With mod Assistance. Outcome: Progressing Goal: RH STG ABLE TO PERFORM INCISION/WOUND CARE W/ASSISTANCE Description: STG Able To Perform Incision/Wound Care With total Assistance from caregiver. Outcome: Progressing   Problem: RH SAFETY Goal: RH STG ADHERE TO SAFETY PRECAUTIONS W/ASSISTANCE/DEVICE Description: STG Adhere to Safety Precautions With supervision Assistance/Device. Outcome: Progressing   Problem: RH KNOWLEDGE DEFICIT Goal: RH STG INCREASE KNOWLEDGE OF HYPERTENSION Description: Patient/caregiver will verbalize understanding of management of HTN including diet, medications, monitoring, exercise, and follow up appointments with min assist. Outcome: Progressing Goal: RH STG INCREASE KNOWLEGDE OF HYPERLIPIDEMIA Description: Patient/caregiver will verbalize understanding of management of HLD including diet, medications, monitoring, exercise, and follow up appointments with min assist. Outcome: Progressing Goal: RH STG INCREASE KNOWLEDGE OF STROKE PROPHYLAXIS Description: Patient/caregiver will verbalize  understanding of management of stroke prophylaxis including diet, medications, monitoring, exercise, and follow up appointments with min assist. Outcome: Progressing

## 2019-10-25 NOTE — Progress Notes (Signed)
[facility] PHYSICAL MEDICINE & REHABILITATION PROGRESS NOTE   Subjective/Complaints:   Pt reports no issues- does report nursing -time to change his dressing on sacrum.   ROS: Patient denies fever, rash, sore throat, blurred vision, nausea, vomiting, diarrhea, cough, chest pain, joint or back pain, headache, or mood change.   Objective:   No results found. No results for input(s): WBC, HGB, HCT, PLT in the last 72 hours. Recent Labs    10/23/19 0600 10/25/19 0338  NA 139 140  K 3.4* 3.8  CL 105 107  CO2 23 22  GLUCOSE 93 95  BUN 39* 34*  CREATININE 3.78* 3.07*  CALCIUM 10.3 10.0    Intake/Output Summary (Last 24 hours) at 10/25/2019 1118 Last data filed at 10/25/2019 0954 Gross per 24 hour  Intake 720 ml  Output 900 ml  Net -180 ml     Physical Exam: Vital Signs Blood pressure 131/75, pulse (!) 104, temperature 98.4 F (36.9 C), temperature source Oral, resp. rate 18, height 5\' 10"  (1.778 m), weight 81 kg, SpO2 99 %. Constitutional: No distress . Labs and Vital signs reviewed. HEENT: EOMI, oral membranes moist Neck: supple Cardiovascular: tachy 100-110 without murmur. No JVD    Respiratory: CTA Bilaterally without wheezes or rales. Normal effort    GI: BS +, non-tender, non-distended  Skin: Warm and dry.  Intact. Has sacral decub- cannot assess Psych: extremely pleasant Musc: No edema in extremities.  No tenderness in extremities. Neuro: Alert Dysphonia, unchanged  Motor: RUE/RLE: 4/5 proximal distal LUE: Shoulder abduction, elbow flexion/extension 4-/5, handgrip 0/5--stable exam  LLE: Hip flexion, knee extension 4-/5, ankle dorsiflexion 0/5---stable  Assessment/Plan: 1. Functional deficits secondary to Left hemiparesis from RIght parietal infarct   which require 3+ hours per day of interdisciplinary therapy in a comprehensive inpatient rehab setting.  Physiatrist is providing close team supervision and 24 hour management of active medical problems listed  below.  Physiatrist and rehab team continue to assess barriers to discharge/monitor patient progress toward functional and medical goals  Care Tool:  Bathing    Body parts bathed by patient: Chest, Abdomen, Front perineal area, Right upper leg, Left upper leg, Face, Left arm   Body parts bathed by helper: Right arm, Buttocks, Right lower leg, Left lower leg     Bathing assist Assist Level: Moderate Assistance - Patient 50 - 74%     Upper Body Dressing/Undressing Upper body dressing   What is the patient wearing?: Pull over shirt    Upper body assist Assist Level: Maximal Assistance - Patient 25 - 49%    Lower Body Dressing/Undressing Lower body dressing      What is the patient wearing?: Pants     Lower body assist Assist for lower body dressing: Total Assistance - Patient < 25%     Toileting Toileting Toileting Activity did not occur Landscape architect and hygiene only): N/A (no void or bm)  Toileting assist Assist for toileting: 2 Helpers Assistive Device Comment: urinal    Transfers Chair/bed transfer  Transfers assist  Chair/bed transfer activity did not occur: Safety/medical concerns  Chair/bed transfer assist level: Maximal Assistance - Patient 25 - 49%     Locomotion Ambulation   Ambulation assist   Ambulation activity did not occur: Safety/medical concerns  Assist level: 2 helpers Assistive device: Other (comment)(rail in hall) Max distance: 4   Walk 10 feet activity   Assist  Walk 10 feet activity did not occur: Safety/medical concerns        Walk  50 feet activity   Assist Walk 50 feet with 2 turns activity did not occur: Safety/medical concerns         Walk 150 feet activity   Assist Walk 150 feet activity did not occur: Safety/medical concerns         Walk 10 feet on uneven surface  activity   Assist Walk 10 feet on uneven surfaces activity did not occur: Safety/medical concerns          Wheelchair     Assist   Type of Wheelchair: Manual    Wheelchair assist level: Minimal Assistance - Patient > 75% Max wheelchair distance: 100    Wheelchair 50 feet with 2 turns activity    Assist        Assist Level: Minimal Assistance - Patient > 75%   Wheelchair 150 feet activity     Assist      Assist Level: Dependent - Patient 0%   Blood pressure 131/75, pulse (!) 104, temperature 98.4 F (36.9 C), temperature source Oral, resp. rate 18, height 5\' 10"  (1.778 m), weight 81 kg, SpO2 99 %.  Medical Problem List and Plan: 1.  Debility secondary to acute hypoxemic RIght parietal infarct with Left hemiparesis LE>UE   Continue CIR- PT, OT, SLP, extended stay by 7 d given potential to exceed original goals and reduce care needs  2.  Antithrombotics: -DVT/anticoagulation: Subcutaneous heparin. Negative  vascular study             -antiplatelet therapy: Aspirin 81 mg daily 3. Pain Management: Hycet as needed,some sacral pain but not severe   Relatively controlled on 12/28  12/30: Will add Lidocaine patch to left shoulder  12/31: better controlled. Added left knee neoprene sleeve for comfort and support; will hopefully be fitted by ortho tech today.   1/1: well controlled.  4. Mood: Provide emotional support. Rozerem 8 mg nightly             -antipsychotic agents: N/A 5. Neuropsych: This patient is capable of making decisions on his own behalf.             SLP recommends additional diagnostics of higher level cognitive skills.  6. Skin/Wound Care: Wound care sacral decubitus with tunneling. , cont mattress overlay, sidelying recommended   -pack Aquacel Ag+ twice daily for WOC  -woc rn consulted- apreciate surgery input, no I and D recommended discussed with Pharmacy, expanding antibiotic coverage to include bacteroides fragilis    -nutrition, turning, prevalon boots for heels   7. Fluids/Electrolytes/Nutrition: Routine in and outs 8.  Anemia/melena.  Continue  Aranesp  Hgb 8.3 on 12/22 9.  Tracheostomy 08/22/2019.  Decannulated.  Diet advanced to regular.  Continue nebulizers as directed 10.  MSSA bacteremia/pneumonia.  Antibiotic therapy completed 11.  Hyperlipidemia.  Lipitor 12.  Post stroke oropharyngeal dysphagia: Appreciate speech therapy recommendations; regular diet with oral care BID  -MMW for thrush 13.  Renal failure after sepsis- on HD per Nephro - tent d/c 1/7 will need to make plans for OP HD- creat improving   First stage left basilic vein transposition 12/21  12/27, 12/31: Appreciate nephrology follow-up  1/2- BUN down to 34 (39) and Cr 3.07 (3.78)- labs Monday 14.  Infected sacral decub- no I and D per gen surg   -afebrile  WBCs 7.4 on 12/24  - serratia marcesens growing from wound S to ceftriaxone  -Bacteroides growing out as well but no susceptibility reported as rec per Pharm,   on metronidazole for anaerobic  coverage   Continue to monitor 15.  Sleep disturbance no current c/os  Melatonin started on 12/20 16. Constipation:  12/26: Continue senna-docusate BID. Made Miralax standing.    -large bm 12/27  12/29: No BM since 12/27 and patient feels constipated. Will make Miralax BID.  1/1: Needs to have BM now. 17. Hypokalemia. 12/31: 3.4 today. On K+ supplement. 30. Education: educated regarding the importance of taking medications at instructed times.  LOS: 22 days A FACE TO FACE EVALUATION WAS PERFORMED  [name] 10/25/2019, 11:18 AM

## 2019-10-26 ENCOUNTER — Inpatient Hospital Stay (HOSPITAL_COMMUNITY): Payer: Medicare HMO | Admitting: Physical Therapy

## 2019-10-26 NOTE — Progress Notes (Signed)
Physical Therapy Session Note  Patient Details  Name: [name] MRN: [id number] Date of Birth: 03/27/1951  Today's Date: 10/26/2019 PT Individual Time: 1403-1500 PT Individual Time Calculation (min): 57 min   Short Term Goals: Week 4:  PT Short Term Goal 1 (Week 4): pt will transfer to and from WC with min assist consistantly with LRAD PT Short Term Goal 2 (Week 4): Pt will ambulate 15ft with max assist + 2 for WC follow PT Short Term Goal 3 (Week 4): Pt will propell WC >150ft with supervision assist PT Short Term Goal 4 (Week 4): Pt will tolerate standing up to 1 minute with mod assist  Skilled Therapeutic Interventions/Progress Updates: Pt presented in bed with wife present agreeable to therapy. Pt stating some pain in L knee but unable to rate. PTA and wife donned pants maxA for time management. Pt was able to perform bridge with PTA blocking feet to pull pants over hips. Performed supine to sit with minA and PTA donned shoes total A. Pt performed STS in Stedy to transfer to w/c. Pt transported to rehab gym and performed SB transfer to mat to R with CGA and PTA performing set up. Participated in STS from elevated mat with RW x 2 with modA. PTA providing tactile cues to engage R quad to improve L knee extension as well as increasing wt shifting to L. Pt only able to tolerate standing of bouts up to 20 sec. Pt then attempted STS in Stedy with rationale to increase wt bearing tolerance. Performed STS x 3 in Stedy with modA and pt was able to increase bouts of L knee extension in Stedy with PTA providing manual facilitation to shift wt to L. Pt continued to only tolerate standing of approx 20 sec. Pt transferred back to w/c via Stedy and pt participated in w/c mobility propelling >120ft with multiple rest breaks and supervision. Pt did require mod verbal cues to maintain straight trajectory vs drifting to L. Pt transported remaining distance to room and performed Stedy transfer back to bed. Pt  transferred to supine with modA. Pt left in bed at end of session with call bell within reach and needs met.      Therapy Documentation Precautions:  Precautions Precautions: Fall Precaution Comments: Sacral decubitus wounds, Lt hemi Restrictions Weight Bearing Restrictions: No General:   Vital Signs: Therapy Vitals Temp: 98.8 F (37.1 C) Pulse Rate: (!) 108 Resp: 16 BP: 139/66 Patient Position (if appropriate): Lying Oxygen Therapy SpO2: 100 % O2 Device: Room Air Other Treatments:      Therapy/Group: Individual Therapy      , PTA  10/26/2019, 4:04 PM  

## 2019-10-26 NOTE — Plan of Care (Signed)
  Problem: Consults Goal: RH STROKE PATIENT EDUCATION Description: See Patient Education module for education specifics  Outcome: Progressing   Problem: RH BOWEL ELIMINATION Goal: RH STG MANAGE BOWEL WITH ASSISTANCE Description: STG Manage Bowel with min/mod Assistance. Outcome: Progressing Goal: RH STG MANAGE BOWEL W/MEDICATION W/ASSISTANCE Description: STG Manage Bowel with Medication with mod I Assistance. Outcome: Progressing   Problem: RH BLADDER ELIMINATION Goal: RH STG MANAGE BLADDER WITH ASSISTANCE Description: STG Manage Bladder With min/mod Assistance Outcome: Progressing   Problem: RH SKIN INTEGRITY Goal: RH STG SKIN FREE OF INFECTION/BREAKDOWN Description: Patients skin will remain free from further infection or breakdown with mod assist. Outcome: Progressing Goal: RH STG MAINTAIN SKIN INTEGRITY WITH ASSISTANCE Description: STG Maintain Skin Integrity With mod Assistance. Outcome: Progressing Goal: RH STG ABLE TO PERFORM INCISION/WOUND CARE W/ASSISTANCE Description: STG Able To Perform Incision/Wound Care With total Assistance from caregiver. Outcome: Progressing   Problem: RH SAFETY Goal: RH STG ADHERE TO SAFETY PRECAUTIONS W/ASSISTANCE/DEVICE Description: STG Adhere to Safety Precautions With supervision Assistance/Device. Outcome: Progressing   Problem: RH KNOWLEDGE DEFICIT Goal: RH STG INCREASE KNOWLEDGE OF HYPERTENSION Description: Patient/caregiver will verbalize understanding of management of HTN including diet, medications, monitoring, exercise, and follow up appointments with min assist. Outcome: Progressing Goal: RH STG INCREASE KNOWLEGDE OF HYPERLIPIDEMIA Description: Patient/caregiver will verbalize understanding of management of HLD including diet, medications, monitoring, exercise, and follow up appointments with min assist. Outcome: Progressing Goal: RH STG INCREASE KNOWLEDGE OF STROKE PROPHYLAXIS Description: Patient/caregiver will verbalize  understanding of management of stroke prophylaxis including diet, medications, monitoring, exercise, and follow up appointments with min assist. Outcome: Progressing

## 2019-10-26 NOTE — Progress Notes (Signed)
[facility] Progress Note  Subjective: no c/o today, no n/v or confusion. Creat down 3.0 today  Vitals:   10/25/19 2033 10/26/19 0530 10/26/19 0801 10/26/19 1340  BP:  (!) 144/79  139/66  Pulse: 99 99 96 (!) 108  Resp: 16 20 20 16   Temp:  98.2 F (36.8 C)  98.8 F (37.1 C)  TempSrc:  Oral    SpO2: 98% 100% 99% 100%  Height:        Exam: Gen: seen in room, in bed, calm and pleasant CVS: Pulse regular tachycardia, S1 and S2 normal  Resp: Clear to auscultation bilaterally, no rales/rhonchi  Abd: Soft, obese, nontender  Ext: No lower extremity edema   Assessment/ Plan: 1. Acute renal failure/ AKI: had ATN/ AKI from COVID-related septic shock, started CRRT on 08/07/19 and converted to iHD on 08/26/19. Then started making urine so HD was held. Last HD 12/24. Creat peaked at 4.0 on 12.29 and is down to 3.0 today. Appears to be recovering renal function. Cont to hold HD. Check labs on Monday. May be able to remove [facility] soon.  2. COVID-19 pneumonia with hypoxic respiratory failure and prolonged hospitalization/complications: trach is out and pt doing better, on rehab.  3. Anemia: Without overt loss, continue Aranesp for anemia of critical illness. With iron deficiency getting intravenous iron  4. CKD-MBD: With borderline elevated calcium levels when corrected for albumin, low phosphorus  5. Nutrition: Continue current renal diet with oral protein supplementation to promote healing of sacral decubitus  6. Hypertension: Euvolemic on physical exam, blood pressures acceptable.  7. Sacral decubitus: Ongoing aggressive wound management by wound care/surgery  [name] 10/26/2019, 5:56 PM  Inpatient medications: . aspirin EC  81 mg Oral Daily  . atorvastatin  40 mg Oral q1800  . budesonide (PULMICORT) nebulizer solution  0.5 mg Nebulization BID  . chlorhexidine  15 mL Mouth/Throat BID  . Chlorhexidine Gluconate Cloth  6 each Topical BID  . diclofenac Sodium  2 g Topical QID   . feeding supplement (NEPRO CARB STEADY)  237 mL Oral Q24H  . feeding supplement (PRO-STAT SUGAR FREE 64)  30 mL Oral BID  . heparin  5,000 Units Subcutaneous Q8H  . hydrocerin   Topical TID  . lidocaine  1 patch Transdermal Q24H  . magic mouthwash w/lidocaine  5 mL Oral QID  . metroNIDAZOLE  500 mg Oral Q8H  . multivitamin  1 tablet Oral QHS  . pantoprazole  40 mg Oral Q1200  . polyethylene glycol  17 g Oral BID  . ramelteon  8 mg Oral QHS  . senna-docusate  2 tablet Oral BID  . sucralfate  1 g Oral Q6H   . sodium chloride 10 mL/hr at 10/23/19 1527  . cefTRIAXone (ROCEPHIN)  IV Stopped (10/26/19 0844)   sodium chloride, acetaminophen (TYLENOL) oral liquid 160 mg/5 mL, HYDROcodone-acetaminophen, ipratropium-albuterol, Melatonin, polyvinyl alcohol, sorbitol, white petrolatum

## 2019-10-26 NOTE — Progress Notes (Signed)
[facility] PHYSICAL MEDICINE & REHABILITATION PROGRESS NOTE   Subjective/Complaints:   Pt reports no issues- "i'm good"   ROS: Patient denies fever, rash, sore throat, blurred vision, nausea, vomiting, diarrhea, cough, chest pain, joint or back pain, headache, or mood change.   Objective:   No results found. No results for input(s): WBC, HGB, HCT, PLT in the last 72 hours. Recent Labs    10/25/19 0338  NA 140  K 3.8  CL 107  CO2 22  GLUCOSE 95  BUN 34*  CREATININE 3.07*  CALCIUM 10.0    Intake/Output Summary (Last 24 hours) at 10/26/2019 1044 Last data filed at 10/26/2019 0526 Gross per 24 hour  Intake 360 ml  Output 550 ml  Net -190 ml     Physical Exam: Vital Signs Blood pressure (!) 144/79, pulse 96, temperature 98.2 F (36.8 C), temperature source Oral, resp. rate 20, height 5\' 10"  (1.778 m), weight 81 kg, SpO2 99 %. Constitutional: No distress . Labs and Vital signs reviewed. HEENT: EOMI, oral membranes moist Neck: supple Cardiovascular: tachy 100-110 without murmur. No JVD    Respiratory: CTA Bilaterally without wheezes or rales. Normal effort    GI: BS +, non-tender, non-distended  Skin: Warm and dry.  Intact. Has sacral decub- cannot assess Psych: extremely pleasant Musc: No edema in extremities.  No tenderness in extremities. Neuro: Alert Dysphonia, unchanged  Motor: RUE/RLE: 4/5 proximal distal LUE: Shoulder abduction, elbow flexion/extension 4-/5, handgrip 0/5--stable exam  LLE: Hip flexion, knee extension 4-/5, ankle dorsiflexion 0/5---stable  Assessment/Plan: 1. Functional deficits secondary to Left hemiparesis from RIght parietal infarct   which require 3+ hours per day of interdisciplinary therapy in a comprehensive inpatient rehab setting.  Physiatrist is providing close team supervision and 24 hour management of active medical problems listed below.  Physiatrist and rehab team continue to assess barriers to discharge/monitor patient progress  toward functional and medical goals  Care Tool:  Bathing    Body parts bathed by patient: Chest, Abdomen, Front perineal area, Right upper leg, Left upper leg, Face, Left arm   Body parts bathed by helper: Right arm, Buttocks, Right lower leg, Left lower leg     Bathing assist Assist Level: Moderate Assistance - Patient 50 - 74%     Upper Body Dressing/Undressing Upper body dressing   What is the patient wearing?: Pull over shirt    Upper body assist Assist Level: Maximal Assistance - Patient 25 - 49%    Lower Body Dressing/Undressing Lower body dressing      What is the patient wearing?: Pants     Lower body assist Assist for lower body dressing: Total Assistance - Patient < 25%     Toileting Toileting Toileting Activity did not occur Landscape architect and hygiene only): N/A (no void or bm)  Toileting assist Assist for toileting: 2 Helpers Assistive Device Comment: urinal    Transfers Chair/bed transfer  Transfers assist  Chair/bed transfer activity did not occur: Safety/medical concerns  Chair/bed transfer assist level: Maximal Assistance - Patient 25 - 49%     Locomotion Ambulation   Ambulation assist   Ambulation activity did not occur: Safety/medical concerns  Assist level: 2 helpers Assistive device: Other (comment)(rail in hall) Max distance: 4   Walk 10 feet activity   Assist  Walk 10 feet activity did not occur: Safety/medical concerns        Walk 50 feet activity   Assist Walk 50 feet with 2 turns activity did not occur: Safety/medical concerns  Walk 150 feet activity   Assist Walk 150 feet activity did not occur: Safety/medical concerns         Walk 10 feet on uneven surface  activity   Assist Walk 10 feet on uneven surfaces activity did not occur: Safety/medical concerns         Wheelchair     Assist   Type of Wheelchair: Manual    Wheelchair assist level: Minimal Assistance - Patient >  75% Max wheelchair distance: 100    Wheelchair 50 feet with 2 turns activity    Assist        Assist Level: Minimal Assistance - Patient > 75%   Wheelchair 150 feet activity     Assist      Assist Level: Dependent - Patient 0%   Blood pressure (!) 144/79, pulse 96, temperature 98.2 F (36.8 C), temperature source Oral, resp. rate 20, height 5\' 10"  (1.778 m), weight 81 kg, SpO2 99 %.  Medical Problem List and Plan: 1.  Debility secondary to acute hypoxemic RIght parietal infarct with Left hemiparesis LE>UE   Continue CIR- PT, OT, SLP, extended stay by 7 d given potential to exceed original goals and reduce care needs  2.  Antithrombotics: -DVT/anticoagulation: Subcutaneous heparin. Negative  vascular study             -antiplatelet therapy: Aspirin 81 mg daily 3. Pain Management: Hycet as needed,some sacral pain but not severe   Relatively controlled on 12/28  12/30: Will add Lidocaine patch to left shoulder  12/31: better controlled. Added left knee neoprene sleeve for comfort and support; will hopefully be fitted by ortho tech today.   1/1: well controlled.  4. Mood: Provide emotional support. Rozerem 8 mg nightly             -antipsychotic agents: N/A 5. Neuropsych: This patient is capable of making decisions on his own behalf.             SLP recommends additional diagnostics of higher level cognitive skills.  6. Skin/Wound Care: Wound care sacral decubitus with tunneling. , cont mattress overlay, sidelying recommended   -pack Aquacel Ag+ twice daily for WOC  -woc rn consulted- apreciate surgery input, no I and D recommended discussed with Pharmacy, expanding antibiotic coverage to include bacteroides fragilis    -nutrition, turning, prevalon boots for heels   7. Fluids/Electrolytes/Nutrition: Routine in and outs 8.  Anemia/melena.  Continue Aranesp  Hgb 8.3 on 12/22 9.  Tracheostomy 08/22/2019.  Decannulated.  Diet advanced to regular.  Continue nebulizers as  directed 10.  MSSA bacteremia/pneumonia.  Antibiotic therapy completed 11.  Hyperlipidemia.  Lipitor 12.  Post stroke oropharyngeal dysphagia: Appreciate speech therapy recommendations; regular diet with oral care BID  -MMW for thrush 13.  Renal failure after sepsis- on HD per Nephro - tent d/c 1/7 will need to make plans for OP HD- creat improving   First stage left basilic vein transposition 12/21  12/27, 12/31: Appreciate nephrology follow-up  1/2- BUN down to 34 (39) and Cr 3.07 (3.78)- labs Monday 14.  Infected sacral decub- no I and D per gen surg   -afebrile  WBCs 7.4 on 12/24  - serratia marcesens growing from wound S to ceftriaxone  -Bacteroides growing out as well but no susceptibility reported as rec per Pharm,   on metronidazole for anaerobic coverage   Continue to monitor 15.  Sleep disturbance no current c/os  Melatonin started on 12/20 16. Constipation:  12/26: Continue senna-docusate BID. Made  Miralax standing.    -large bm 12/27  12/29: No BM since 12/27 and patient feels constipated. Will make Miralax BID.  1/1: Needs to have BM now. 17. Hypokalemia. 12/31: 3.4 today. On K+ supplement. 19. Education: educated regarding the importance of taking medications at instructed times.  LOS: 23 days A FACE TO FACE EVALUATION WAS PERFORMED  [name] 10/26/2019, 10:44 AM

## 2019-10-27 ENCOUNTER — Inpatient Hospital Stay (HOSPITAL_COMMUNITY): Payer: Medicare HMO | Admitting: Occupational Therapy

## 2019-10-27 ENCOUNTER — Inpatient Hospital Stay (HOSPITAL_COMMUNITY): Payer: Medicare HMO

## 2019-10-27 LAB — CBC WITH DIFFERENTIAL/PLATELET
Abs Immature Granulocytes: 0.02 10*3/uL (ref 0.00–0.07)
Basophils Absolute: 0 10*3/uL (ref 0.0–0.1)
Basophils Relative: 1 %
Eosinophils Absolute: 0.4 10*3/uL (ref 0.0–0.5)
Eosinophils Relative: 7 %
HCT: 28.8 % — ABNORMAL LOW (ref 39.0–52.0)
Hemoglobin: 8.6 g/dL — ABNORMAL LOW (ref 13.0–17.0)
Immature Granulocytes: 0 %
Lymphocytes Relative: 19 %
Lymphs Abs: 1 10*3/uL (ref 0.7–4.0)
MCH: 27.8 pg (ref 26.0–34.0)
MCHC: 29.9 g/dL — ABNORMAL LOW (ref 30.0–36.0)
MCV: 93.2 fL (ref 80.0–100.0)
Monocytes Absolute: 0.5 10*3/uL (ref 0.1–1.0)
Monocytes Relative: 10 %
Neutro Abs: 3.4 10*3/uL (ref 1.7–7.7)
Neutrophils Relative %: 63 %
Platelets: 371 10*3/uL (ref 150–400)
RBC: 3.09 MIL/uL — ABNORMAL LOW (ref 4.22–5.81)
RDW: 16.7 % — ABNORMAL HIGH (ref 11.5–15.5)
WBC: 5.4 10*3/uL (ref 4.0–10.5)
nRBC: 0 % (ref 0.0–0.2)

## 2019-10-27 LAB — BASIC METABOLIC PANEL
Anion gap: 9 (ref 5–15)
BUN: 33 mg/dL — ABNORMAL HIGH (ref 8–23)
CO2: 23 mmol/L (ref 22–32)
Calcium: 10 mg/dL (ref 8.9–10.3)
Chloride: 108 mmol/L (ref 98–111)
Creatinine, Ser: 3.09 mg/dL — ABNORMAL HIGH (ref 0.61–1.24)
GFR calc Af Amer: 23 mL/min — ABNORMAL LOW (ref >60)
GFR calc non Af Amer: 20 mL/min — ABNORMAL LOW (ref >60)
Glucose, Bld: 93 mg/dL (ref 70–99)
Potassium: 3.7 mmol/L (ref 3.5–5.1)
Sodium: 140 mmol/L (ref 135–145)

## 2019-10-27 NOTE — Progress Notes (Signed)
Physical Therapy Session Note  Patient Details  Name: [name] MRN: [id number] Date of Birth: December 10, 1950  Today's Date: 10/27/2019 PT Individual Time: 1045-1200 PT Individual Time Calculation (min): 75 min   Short Term Goals: Week 4:  PT Short Term Goal 1 (Week 4): pt will transfer to and from [facility] with min assist consistantly with LRAD PT Short Term Goal 2 (Week 4): Pt will ambulate 23ft with max assist + 2 for WC follow PT Short Term Goal 3 (Week 4): Pt will propell WC >139ft with supervision assist PT Short Term Goal 4 (Week 4): Pt will tolerate standing up to 1 minute with mod assist  Skilled Therapeutic Interventions/Progress Updates: Pt presented in bed sleeping but easily aroused and agreeable to therapy. Pt performed bed mobility with minA and verbal cues for not pulling with RUE on headboard. PTA donned shoes total A for time management. Performed SB transfer to R with PTA setting up SB with CGA and increased time. Pt transported to day room and performed SB transfer to mat in same manner. Pt then participated in STS and increasing standing time with Lite Gait. Pt performed x 5 STS in Lite Gait with pt progressing time in standing up to 30 seconds. During standing bouts PTA providing manual facilitation to shift weight to L while blocking L knee. PTA also trialed L knee cage for pt's comfort while in standing with limited success as pt continues to become fearful due to L knee instability. PTA discussed with pt rationale in increasing standing time with pt verbalizing understanding. Pt stating concerned due to increasing fatigue and feeling of not progressing. PTA educated pt on overall progress and that pt is now doing more physical activity than prior thus bringing on level of fatigue more quickly.  Pt then participated in supine L NMR via forced use. Pt performed heel slides, hip abd/add, AA SLR, manually resisted leg press x 10. During leg press pt indicated that pain present behind  knee, when PTA palpated noted to be TTP at biceps femoris. Pt then transferred back to supine via log roll with modA and performed SB transfer to R with PTA setting up SB and CGA. Pt transported back to room and performed STS in [place] with minA with transfer back to bed per pt's request. Pt able to perform bridge and use RUE to facilitate scooting to [facility]. Pt positioned to comfort and left with call bell within reach and wife present.      Therapy Documentation Precautions:  Precautions Precautions: Fall Precaution Comments: Sacral decubitus wounds, Lt hemi Restrictions Weight Bearing Restrictions: No General:   Vital Signs:  Pain:     Therapy/Group: Individual Therapy  Clyde Zarrella  Maico Mulvehill, PTA  10/27/2019, 1:00 PM

## 2019-10-27 NOTE — Progress Notes (Signed)
Occupational Therapy Session Note  Patient Details  Name: [name] MRN: [id number] Date of Birth: 1951-09-25  Today's Date: 10/27/2019 OT Individual Time: 1303-1400 OT Individual Time Calculation (min): 57 min    Short Term Goals: Week 3:  OT Short Term Goal 1 (Week 3): Pt will complete BSC or toilet transfer with 1 assist using LRAD OT Short Term Goal 2 (Week 3): Pt will complete 2 grooming tasks EOB with no more than Min A for balance OT Short Term Goal 3 (Week 3): Pt will complete UB dressing with Mod A  Skilled Therapeutic Interventions/Progress Updates:    Treatment session with focus on LUE NMR.  Pt received semi-reclined in bed reporting fatigue from AM sessions.  Pt deferred OOB activity at this time, but agreeable to focus on LUE.  Engaged in [place] in semi-reclined position with focus on activation of elbow extension and flexion.  Therapist providing support at wrist and elbow to facilitate increased ROM.  Increased challenge to maintaining grasp on item while focusing on elbow flexion in functional movement to incorporate reaching outside of body and towards body (reaching for items, bathing/dressing movements)  Increased challenge to incorporating reaching towards small checkers and sliding them across table to facilitate elbow extension and horizontal abduction/adduction. Pt required facilitation at wrist and elbow to complete full range.  Noted increased shakiness in fingers with finger isolation and wrist extension.  Pt very fatigued during session, often closing eyes but able to complete activities as directed.    Therapy Documentation Precautions:  Precautions Precautions: Fall Precaution Comments: Sacral decubitus wounds, Lt hemi Restrictions Weight Bearing Restrictions: No General:   Vital Signs: Therapy Vitals Temp: 98.6 F (37 C) Temp Source: Oral Pulse Rate: (!) 115 Resp: 16 BP: (!) 144/83 Oxygen Therapy SpO2: 100 % Pain:  Pt with no c/o  pain   Therapy/Group: Individual Therapy  Simonne Come 10/27/2019, 3:06 PM

## 2019-10-27 NOTE — Progress Notes (Addendum)
[place] PHYSICAL MEDICINE & REHABILITATION PROGRESS NOTE   Subjective/Complaints:   Would like to go home sooner than 1/15, wants to see grandchildren    ROS: Patient denies CP, SOB, N/V/D  Objective:   No results found. Recent Labs    10/27/19 0503  WBC 5.4  HGB 8.6*  HCT 28.8*  PLT 371   Recent Labs    10/25/19 0338 10/27/19 0503  NA 140 140  K 3.8 3.7  CL 107 108  CO2 22 23  GLUCOSE 95 93  BUN 34* 33*  CREATININE 3.07* 3.09*  CALCIUM 10.0 10.0    Intake/Output Summary (Last 24 hours) at 10/27/2019 0757 Last data filed at 10/27/2019 0700 Gross per 24 hour  Intake --  Output 500 ml  Net -500 ml     Physical Exam: Vital Signs Blood pressure 137/77, pulse (!) 107, temperature 98.2 F (36.8 C), temperature source Oral, resp. rate 16, height 5\' 10"  (1.778 m), weight 81 kg, SpO2 97 %. Constitutional: No distress . Labs and Vital signs reviewed. HEENT: EOMI, oral membranes moist Neck: supple Cardiovascular: tachy 100-110 without murmur. No JVD    Respiratory: CTA Bilaterally without wheezes or rales. Normal effort    GI: BS +, non-tender, non-distended  Skin: Warm and dry.  Intact. Has sacral decub- cannot assess Psych: extremely pleasant Musc: No edema in extremities.  No tenderness in extremities. Neuro: Alert Dysphonia, unchanged  Motor: RUE/RLE: 4/5 proximal distal LUE: Shoulder abduction, elbow flexion/extension 4-/5, handgrip 0/5--stable exam  LLE: Hip flexion, knee extension 4-/5, ankle dorsiflexion 0/5---stable  Assessment/Plan: 1. Functional deficits secondary to Left hemiparesis from RIght parietal infarct   which require 3+ hours per day of interdisciplinary therapy in a comprehensive inpatient rehab setting.  Physiatrist is providing close team supervision and 24 hour management of active medical problems listed below.  Physiatrist and rehab team continue to assess barriers to discharge/monitor patient progress toward functional and medical  goals  Care Tool:  Bathing    Body parts bathed by patient: Chest, Abdomen, Front perineal area, Right upper leg, Left upper leg, Face, Left arm   Body parts bathed by helper: Right arm, Buttocks, Right lower leg, Left lower leg     Bathing assist Assist Level: Moderate Assistance - Patient 50 - 74%     Upper Body Dressing/Undressing Upper body dressing   What is the patient wearing?: Pull over shirt    Upper body assist Assist Level: Maximal Assistance - Patient 25 - 49%    Lower Body Dressing/Undressing Lower body dressing      What is the patient wearing?: Pants     Lower body assist Assist for lower body dressing: Total Assistance - Patient < 25%     Toileting Toileting Toileting Activity did not occur Landscape architect and hygiene only): N/A (no void or bm)  Toileting assist Assist for toileting: 2 Helpers Assistive Device Comment: urinal    Transfers Chair/bed transfer  Transfers assist  Chair/bed transfer activity did not occur: Safety/medical concerns  Chair/bed transfer assist level: Maximal Assistance - Patient 25 - 49%     Locomotion Ambulation   Ambulation assist   Ambulation activity did not occur: Safety/medical concerns  Assist level: 2 helpers Assistive device: Other (comment)(rail in hall) Max distance: 4   Walk 10 feet activity   Assist  Walk 10 feet activity did not occur: Safety/medical concerns        Walk 50 feet activity   Assist Walk 50 feet with 2 turns  activity did not occur: Safety/medical concerns         Walk 150 feet activity   Assist Walk 150 feet activity did not occur: Safety/medical concerns         Walk 10 feet on uneven surface  activity   Assist Walk 10 feet on uneven surfaces activity did not occur: Safety/medical concerns         Wheelchair     Assist   Type of Wheelchair: Manual    Wheelchair assist level: Minimal Assistance - Patient > 75% Max wheelchair distance: 100     Wheelchair 50 feet with 2 turns activity    Assist        Assist Level: Minimal Assistance - Patient > 75%   Wheelchair 150 feet activity     Assist      Assist Level: Dependent - Patient 0%   Blood pressure 137/77, pulse (!) 107, temperature 98.2 F (36.8 C), temperature source Oral, resp. rate 16, height 5\' 10"  (1.778 m), weight 81 kg, SpO2 97 %.  Medical Problem List and Plan: 1.  Debility secondary to acute hypoxemic RIght parietal infarct with Left hemiparesis LE>UE   Continue CIR- PT, OT, SLP, extended stay by 7 d given potential to exceed original goals and reduce care needs  2.  Antithrombotics: -DVT/anticoagulation: Subcutaneous heparin. Negative  vascular study             -antiplatelet therapy: Aspirin 81 mg daily 3. Pain Management: Hycet as needed,some sacral pain but not severe   Relatively controlled on 12/28  12/30: Will add Lidocaine patch to left shoulder  12/31: better controlled. Added left knee neoprene sleeve for comfort and support; will hopefully be fitted by ortho tech today.   1/1: well controlled.  4. Mood: Provide emotional support. Rozerem 8 mg nightly             -antipsychotic agents: N/A 5. Neuropsych: This patient is capable of making decisions on his own behalf.             SLP recommends additional diagnostics of higher level cognitive skills.  6. Skin/Wound Care: Wound care sacral decubitus with tunneling. , cont mattress overlay, sidelying recommended   -pack Aquacel Ag+ twice daily for WOC  -woc rn consulted- apreciate surgery input, no I and D recommended discussed with Pharmacy, expanding antibiotic coverage to include bacteroides fragilis    -nutrition, turning, prevalon boots for heels   7. Fluids/Electrolytes/Nutrition: Routine in and outs 8.  Anemia/melena.  Continue Aranesp  Hgb 8.6 on 1/4 9.  Tracheostomy 08/22/2019.  Decannulated.  Diet advanced to regular.  Continue nebulizers as directed 10.  MSSA  bacteremia/pneumonia.  Antibiotic therapy completed 11.  Hyperlipidemia.  Lipitor 12.  Post stroke oropharyngeal dysphagia: Appreciate speech therapy recommendations; regular diet with oral care BID  -MMW for thrush 13.  Renal failure after sepsis- on HD per Nephro - tent d/c 1/15 will need to make plans for OP HD- creat improving   First stage left basilic vein transposition 12/21  12/27, 12/31: Appreciate nephrology follow-up  1/2- BUN down to 34 (39) and Cr 3.07 (3.78)- labs Monday 14.  Infected sacral decub- no I and D per gen surg   -afebrile  WBCs 7.4 on 12/24  - serratia marcesens growing from wound S to ceftriaxone  -Bacteroides growing out as well but no susceptibility reported as rec per Pharm,   on metronidazole for anaerobic coverage   Continue to monitor 15.  Sleep disturbance no current  c/os  Melatonin started on 12/20 16. Constipation:  12/26: Continue senna-docusate BID. Made Miralax standing.    -large bm 12/27  12/29: No BM since 12/27 and patient feels constipated. Will make Miralax BID.  1/1: Needs to have BM now. 17. Hypokalemia. 1/4: 3.7 On K+ supplement.per nephro given HD   LOS: 24 days A FACE TO FACE EVALUATION WAS PERFORMED  Charlett Blake 10/27/2019, 7:57 AM

## 2019-10-27 NOTE — Progress Notes (Signed)
   10/27/19 1334  MEWS Score  Resp 16  Pulse Rate (!) 115  BP (!) 144/83  Temp 98.6 F (37 C)  SpO2 100 %  MEWS Score  MEWS RR 0  MEWS Pulse 2  MEWS Systolic 0  MEWS LOC 0  MEWS Temp 0  MEWS Score 2  MEWS Score Color Yellow  MEWS Assessment  Is this an acute change? No (Pt was working with therapy)    Notified by NT of yellow MEWS due to elevated HR.  This is NOT a new finding, thus no cause for implementing MEWS protocol.  Will continue to monitor.  Brita Romp, RN

## 2019-10-27 NOTE — Progress Notes (Signed)
[facility] Progress Note  Subjective: no c/o today, no n/v or confusion. Creat stable at 3.0 today  Vitals:   10/26/19 1340 10/26/19 2010 10/26/19 2042 10/27/19 0334  BP: 139/66 (!) 155/83  137/77  Pulse: (!) 108 (!) 108  (!) 107  Resp: 16 18  16   Temp: 98.8 F (37.1 C) 97.8 F (36.6 C)  98.2 F (36.8 C)  TempSrc:  Oral  Oral  SpO2: 100% 100% 98% 97%  Height:        Exam: Gen: seen in room, in bed, calm and pleasant  CVS: Pulse regular tachycardia, S1 and S2 normal  Resp: Clear to auscultation bilaterally, no rales/rhonchi  Abd: Soft, obese, nontender  Ext: No lower extremity edema   Assessment/ Plan: 1. Acute renal failure/ AKI: had ATN/ AKI from COVID-related septic shock, started CRRT on 08/07/19 and converted to iHD on 08/26/19. Then started making urine so HD was held. Last HD 12/24. Creat peaked at 4.0 on 12.29 and is down to 3.0 today which may be his new baseline. Cont to hold HD. Check labs Wed. May be able to remove [facility] soon.  2. COVID-19 pneumonia with hypoxic respiratory failure and prolonged hospitalization/complications: trach is out and pt doing better, on rehab.  3. Anemia: Without overt loss, continue Aranesp for anemia of critical illness. With iron deficiency getting intravenous iron  4. CKD-MBD: With borderline elevated calcium levels when corrected for albumin, low phosphorus  5. Nutrition: Continue current renal diet with oral protein supplementation to promote healing of sacral decubitus  6. Hypertension: Euvolemic on physical exam, blood pressures acceptable.  7. Sacral decubitus: Ongoing aggressive wound management by wound care/surgery  Inpatient medications: . aspirin EC  81 mg Oral Daily  . atorvastatin  40 mg Oral q1800  . budesonide (PULMICORT) nebulizer solution  0.5 mg Nebulization BID  . chlorhexidine  15 mL Mouth/Throat BID  . Chlorhexidine Gluconate Cloth  6 each Topical BID  . diclofenac Sodium  2 g Topical QID  . feeding  supplement (NEPRO CARB STEADY)  237 mL Oral Q24H  . feeding supplement (PRO-STAT SUGAR FREE 64)  30 mL Oral BID  . heparin  5,000 Units Subcutaneous Q8H  . hydrocerin   Topical TID  . lidocaine  1 patch Transdermal Q24H  . magic mouthwash w/lidocaine  5 mL Oral QID  . metroNIDAZOLE  500 mg Oral Q8H  . multivitamin  1 tablet Oral QHS  . pantoprazole  40 mg Oral Q1200  . polyethylene glycol  17 g Oral BID  . ramelteon  8 mg Oral QHS  . senna-docusate  2 tablet Oral BID  . sucralfate  1 g Oral Q6H   . sodium chloride 10 mL/hr at 10/23/19 1527   sodium chloride, acetaminophen (TYLENOL) oral liquid 160 mg/5 mL, HYDROcodone-acetaminophen, ipratropium-albuterol, Melatonin, polyvinyl alcohol, sorbitol, white petrolatum

## 2019-10-27 NOTE — Progress Notes (Signed)
Occupational Therapy Session Note  Patient Details  Name: [name] MRN: [id number] Date of Birth: 09-06-1951  Today's Date: 10/27/2019 OT Individual Time: 0900-1000 OT Individual Time Calculation (min): 60 min   Short Term Goals: Week 3:  OT Short Term Goal 1 (Week 3): Pt will complete BSC or toilet transfer with 1 assist using LRAD OT Short Term Goal 2 (Week 3): Pt will complete 2 grooming tasks EOB with no more than Min A for balance OT Short Term Goal 3 (Week 3): Pt will complete UB dressing with Mod A  Skilled Therapeutic Interventions/Progress Updates:    Pt greeted in bed, RN finishing up with wound dressing. He was agreeable to try to use the [facility] today. Supervision for supine<sit after multiple attempts by pt, he was motivated to complete bed mobility unassisted. Once EOB, pt engaged in 2 grooming tasks with steady assist for balance and HOH for L UE use. Min A for balance when he attempted to thread LEs into pants. Pt unable to obtain figure 4 with L LE due to pain. He was able to reach down toward calf level to pull pants up to thighs. Mod A for sit<stand with RW after OT strapped Lt hand into orthotic. Max A for pulling pants over hips. Slideboard<drop arm BSC completed with Mod A towards Lt side. Several attempts for sit<stand to lower pants were completed, however pt too fatigued to maintain standing for more than a few seconds with RW. Therefore clothing mgt was completed via lateral leans and squat stands. Pt then had B+B void, very large BM. He was able to stand for ~20 second windows for OT to complete hygiene and clothing mgt tasks. Mod A sit<stand with RW and Min A for balance at this time. Afterwards, pt completed slideboard<bed with Mod A once again. Mod A for scooting up towards [place] and for returning to supine position. Pt was able to pull himself up using headboard once bed was placed in trendelenburg position. OT offered to set him up in sidelying position for pressure  relief and pain relief. Pt however declined. He remained in bed with all needs within reach and 4 bedrails up, very very tired but feeling accomplished.   Therapy Documentation Precautions:  Precautions Precautions: Fall Precaution Comments: Sacral decubitus wounds, Lt hemi Restrictions Weight Bearing Restrictions: No Pain:   ADL: ADL Eating: Maximal assistance(for sitting balance) Where Assessed-Eating: Edge of bed Grooming: Maximal assistance Where Assessed-Grooming: Edge of bed Upper Body Bathing: Maximal assistance Where Assessed-Upper Body Bathing: Edge of bed Lower Body Bathing: Dependent(+2 assist) Where Assessed-Lower Body Bathing: Bed level Upper Body Dressing: Dependent Where Assessed-Upper Body Dressing: Edge of bed Lower Body Dressing: Dependent(+2 assist) Where Assessed-Lower Body Dressing: Bed level Toileting: Not assessed Toilet Transfer: Not assessed Tub/Shower Transfer: Not assessed      Therapy/Group: Individual Therapy  Leiani Enright A Kraig Genis 10/27/2019, 12:18 PM

## 2019-10-27 NOTE — Progress Notes (Signed)
Nutrition Follow-up  RD working remotely.  DOCUMENTATION CODES:   Not applicable  INTERVENTION:   - Please obtain updated weight  - Continue Nepro Shake podaily, each supplement provides 425 kcal and 19 grams protein  - ContinuePro-stat30 mlBID, each supplement provides 100 kcals and 15 grams protein  - Continue Regular diet order  -ContinuerenalMVI daily at bedtime  - Encourage adequate PO intake  NUTRITION DIAGNOSIS:   Increased nutrient needs related to acute illness, wound healing as evidenced by estimated needs.  Ongoing, being addressed via supplements  GOAL:   Patient will meet greater than or equal to 90% of their needs  Progressing  MONITOR:   PO intake, Supplement acceptance, Labs, Weight trends, Skin  REASON FOR ASSESSMENT:   Consult Wound healing  ASSESSMENT:   69 yo male admitted to CIR on 12/11 after long hospitalization due to respiratory failure from COVID 19 pna, AKI requiring CRRT and ultimately iHD. PMH include COVID 19 infection, heart burn  Discharge planned for 1/15. Pt no longer requiring iHD. May be able to have [facility] removed.  Pt accepting most Nepro shakes per [facility] documentation.  No new weights since 12/15.  Spoke with pt via phone call to room. Pt reports his appetite is good and that he is eating well. Pt reports he is doing better with the Nepro shakes than he was previously.  Meal Completion: 75-100% x last 8 meals  Medications reviewed and include: Nepro daily, Pro-stat BID, rena-vit, protonix, miralax, senna, sucralfate  Labs reviewed: hemoglobin 8.6  UOP: 500 ml x 24 hours  Diet Order:   Diet Order            Diet regular Room service appropriate? Yes; Fluid consistency: Thin  Diet effective now              EDUCATION NEEDS:   Not appropriate for education at this time  Skin:  Skin Assessment: Skin Integrity Issues: Skin Integrity Issues Stage III: sacrum (per WOC note)  Last BM:   10/27/19  Height:   Ht Readings from Last 1 Encounters:  10/15/19 5\' 10"  (1.778 m)    Weight:   Wt Readings from Last 1 Encounters:  10/02/19 74.1 kg    Ideal Body Weight:  75.5 kg  BMI:  Body mass index is 25.62 kg/m.  Estimated Nutritional Needs:   Kcal:  2200-2400 kcals  Protein:  115-140 grams  Fluid:  1000 mL plus UOP    Gaynell Face, MS, RD, LDN Inpatient Clinical Dietitian Pager: [phone] Weekend/After Hours: [phone]

## 2019-10-28 ENCOUNTER — Inpatient Hospital Stay (HOSPITAL_COMMUNITY): Payer: Medicare HMO | Admitting: Speech Pathology

## 2019-10-28 ENCOUNTER — Inpatient Hospital Stay (HOSPITAL_COMMUNITY): Payer: Medicare HMO | Admitting: Physical Therapy

## 2019-10-28 ENCOUNTER — Inpatient Hospital Stay (HOSPITAL_COMMUNITY): Payer: Medicare HMO | Admitting: Occupational Therapy

## 2019-10-28 NOTE — Progress Notes (Signed)
Speech Language Pathology Weekly Progress and Session Note  Patient Details  Name: [name] MRN: [id number] Date of Birth: 1951-10-20  Beginning of progress report period: October 16, 2019 End of progress report period: October 28, 2019  Today's Date: 10/28/2019 SLP Individual Time: 1000-1025 SLP Individual Time Calculation (min): 25 min  Short Term Goals: Week 3: SLP Short Term Goal 1 (Week 3): Patient will complete higher level reasoning and executive functioning tasks with 90% accuracy and min cues. SLP Short Term Goal 1 - Progress (Week 3): Met SLP Short Term Goal 2 (Week 3): Patient will state and demonstrate compensatory cognitive-linguistic strategies as trained with 90% accuracy and min cues. SLP Short Term Goal 2 - Progress (Week 3): Met SLP Short Term Goal 3 (Week 3): Patient will complete delayed recall tasks for 5 stimuil post ~5 minute delay with 80% accuracy and supervision A verbal cues. SLP Short Term Goal 3 - Progress (Week 3): Met    New Short Term Goals: Week 4: SLP Short Term Goal 1 (Week 4): Patient will complete higher level reasoning and executive functioning tasks with 90% accuracy and supervision cues. SLP Short Term Goal 2 (Week 4): Patient will utilize external aids to recall fucntional informatin in 75% of opportunities with Min A verbal cues. SLP Short Term Goal 3 (Week 4): Patient will perform 25 repetitions of EMST/IMST exercises with overall supervision verbal cues for accuracy with a self-perceived effort level of 8/10. SLP Short Term Goal 4 (Week 4): Patient will utilize speech intelligibility strategies at the sentence level to achieve ~90% intelligibility with Mod I.  Weekly Progress Updates: Patient continues to make functional gains and has met 3 of 3 STGs this reporting period. Currently, patient requires overall Min A verbal cues for higher level problem solving and recall with use of compensatory strategies. Patient and family education ongoing.  Of note, a goal will be added for speech intelligibility with focus on RMT and use of strategies to maximize intelligibility due to patient's hoarse and weak vocal quality. Patient would benefit from continued skilled SLP intervention to maximize his cognitive and speech function prior to discharge.      Intensity: Minumum of 1-2 x/day, 30 to 90 minutes Frequency: 3 to 5 out of 7 days Duration/Length of Stay: 1/15 Treatment/Interventions: Cognitive remediation/compensation;Cueing hierarchy;Functional tasks;Therapeutic Exercise;Patient/family education;Speech/Language facilitation   Daily Session  Skilled Therapeutic Interventions: Skilled treatment session focused on speech goals. SLP facilitated session by providing Min A verbal cues for accuracy with IMST/EMST exercises. Patient performed 25 repetitions with a self-perceived effort level of overall 7/10 with his IMST device increased to 25 cm H2O and his EMST device increased to 14 cm H2O. Patient also required supervision verbal cues for use of speech intelligibility strategies to maximize intelligibility to 100% at the sentence level. Patient requested to use the bathroom and was continent of bowel and bladder. Patient left upright in bed with alarm on and all needs within reach. Continue with current plan of care.      Pain No/Denies Pain   Therapy/Group: Individual Therapy  [name] 10/28/2019, 12:39 PM

## 2019-10-28 NOTE — Progress Notes (Signed)
Physical Therapy Session Note  Patient Details  Name: [name] MRN: [id number] Date of Birth: 1951/01/28  Today's Date: 10/28/2019 PT Individual Time: 1045-1200 PT Individual Time Calculation (min): 75 min   Short Term Goals: Week 4:  PT Short Term Goal 1 (Week 4): pt will transfer to and from [facility] with min assist consistantly with LRAD PT Short Term Goal 2 (Week 4): Pt will ambulate 24ft with max assist + 2 for WC follow PT Short Term Goal 3 (Week 4): Pt will propell WC >170ft with supervision assist PT Short Term Goal 4 (Week 4): Pt will tolerate standing up to 1 minute with mod assist  Skilled Therapeutic Interventions/Progress Updates: Pt presented in w/c agreeable to therapy. Pt stating decreased pain in L knee today. Performed supine to sit with minA with PTA placing emphasis on pushing through with LUE to facilitate truncal support. PTA threaded pants to time management and pt attempted to perform lateral leans to pull pants over hips. Pt was able to perform enough lean to clear R hip. PTA donned shoes and performed STS from [facility] with PTA providing maxA for clothing management. Pt then transferred to w/c and transported to rehab gym hallway. Pt performed STS with modA at wall rail and walked x 52ft at wall rail with maxA. Pt was able to advance LLE however required PTA to adjust placement. Remaining session focused on use of SB for functional transfers at d/c. Pt performed SB transfers "uphill" to R and level to L requiring mod verbal cues for sequencing and improving head/hips relationship. Pt also performed lateral leans to L with emphasis on pushing through with LUE to initiate triceps. Pt then performed SB transfer back to w/c to R on level surface with CGA and transported back to room. Pt requesting to return to bed. Performed STS to [facility] and transferred back to bed. Performed sit to supine from bed flat with modA. Pt left in bed at end of session with call bell within reach and wife  present.      Therapy Documentation Precautions:  Precautions Precautions: Fall Precaution Comments: Sacral decubitus wounds, Lt hemi Restrictions Weight Bearing Restrictions: No General:   Vital Signs: Therapy Vitals Temp: 98.3 F (36.8 C) Pulse Rate: (!) 110 Resp: 20 BP: 118/74 Patient Position (if appropriate): Lying Oxygen Therapy SpO2: 100 % O2 Device: Room Air    Therapy/Group: Individual Therapy  Rito Lecomte  Tamla Winkels, PTA  10/28/2019, 4:59 PM

## 2019-10-28 NOTE — Progress Notes (Signed)
[name] PHYSICAL MEDICINE & REHABILITATION PROGRESS NOTE   Subjective/Complaints: Slept poorly but no other c/os  Appreciate Nephro note, no HD since 12/24   ROS: Patient denies CP, SOB, N/V/D  Objective:   No results found. Recent Labs    10/27/19 0503  WBC 5.4  HGB 8.6*  HCT 28.8*  PLT 371   Recent Labs    10/27/19 0503  NA 140  K 3.7  CL 108  CO2 23  GLUCOSE 93  BUN 33*  CREATININE 3.09*  CALCIUM 10.0    Intake/Output Summary (Last 24 hours) at 10/28/2019 0741 Last data filed at 10/28/2019 0610 Gross per 24 hour  Intake 120 ml  Output 200 ml  Net -80 ml     Physical Exam: Vital Signs Blood pressure (!) 157/82, pulse (!) 101, temperature 98.9 F (37.2 C), temperature source Oral, resp. rate 16, height 5\' 10"  (1.778 m), weight 81 kg, SpO2 99 %. Constitutional: No distress . Labs and Vital signs reviewed. HEENT: EOMI, oral membranes moist Neck: supple Cardiovascular: tachy 100-110 without murmur. No JVD    Respiratory: CTA Bilaterally without wheezes or rales. Normal effort    GI: BS +, non-tender, non-distended  Skin: Warm and dry.  Intact. Has sacral decub- cannot assess Psych: extremely pleasant Musc: No edema in extremities.  No tenderness in extremities. Neuro: Alert Dysphonia, unchanged  Motor: RUE/RLE: 4/5 proximal distal LUE: Shoulder abduction, elbow flexion/extension 4-/5, handgrip 0/5--stable exam  LLE: Hip flexion, knee extension 4-/5, ankle dorsiflexion 0/5---stable  Assessment/Plan: 1. Functional deficits secondary to Left hemiparesis from RIght parietal infarct   which require 3+ hours per day of interdisciplinary therapy in a comprehensive inpatient rehab setting.  Physiatrist is providing close team supervision and 24 hour management of active medical problems listed below.  Physiatrist and rehab team continue to assess barriers to discharge/monitor patient progress toward functional and medical goals  Care Tool:  Bathing     Body parts bathed by patient: Chest, Abdomen, Front perineal area, Right upper leg, Left upper leg, Face, Left arm   Body parts bathed by helper: Right arm, Buttocks, Right lower leg, Left lower leg     Bathing assist Assist Level: Moderate Assistance - Patient 50 - 74%     Upper Body Dressing/Undressing Upper body dressing   What is the patient wearing?: Pull over shirt    Upper body assist Assist Level: Maximal Assistance - Patient 25 - 49%    Lower Body Dressing/Undressing Lower body dressing      What is the patient wearing?: Pants     Lower body assist Assist for lower body dressing: Total Assistance - Patient < 25%     Toileting Toileting Toileting Activity did not occur Landscape architect and hygiene only): N/A (no void or bm)  Toileting assist Assist for toileting: Maximal Assistance - Patient 25 - 49% Assistive Device Comment: urinal    Transfers Chair/bed transfer  Transfers assist  Chair/bed transfer activity did not occur: Safety/medical concerns  Chair/bed transfer assist level: Maximal Assistance - Patient 25 - 49%     Locomotion Ambulation   Ambulation assist   Ambulation activity did not occur: Safety/medical concerns  Assist level: 2 helpers Assistive device: Other (comment)(rail in hall) Max distance: 4   Walk 10 feet activity   Assist  Walk 10 feet activity did not occur: Safety/medical concerns        Walk 50 feet activity   Assist Walk 50 feet with 2 turns activity did not occur:  Safety/medical concerns         Walk 150 feet activity   Assist Walk 150 feet activity did not occur: Safety/medical concerns         Walk 10 feet on uneven surface  activity   Assist Walk 10 feet on uneven surfaces activity did not occur: Safety/medical concerns         Wheelchair     Assist   Type of Wheelchair: Manual    Wheelchair assist level: Minimal Assistance - Patient > 75% Max wheelchair distance: 100     Wheelchair 50 feet with 2 turns activity    Assist        Assist Level: Minimal Assistance - Patient > 75%   Wheelchair 150 feet activity     Assist      Assist Level: Dependent - Patient 0%   Blood pressure (!) 157/82, pulse (!) 101, temperature 98.9 F (37.2 C), temperature source Oral, resp. rate 16, height 5\' 10"  (1.778 m), weight 81 kg, SpO2 99 %.  Medical Problem List and Plan: 1.  Debility secondary to acute hypoxemic RIght parietal infarct with Left hemiparesis LE>UE   Continue CIR- PT, OT, SLP team conf in am , extended stay by 7 d given potential to exceed original goals and reduce care needs  2.  Antithrombotics: -DVT/anticoagulation: Subcutaneous heparin. Negative  vascular study             -antiplatelet therapy: Aspirin 81 mg daily 3. Pain Management: Hycet as needed,some sacral pain but not severe     1/1: well controlled.  4. Mood: Provide emotional support. Rozerem 8 mg nightly             -antipsychotic agents: N/A 5. Neuropsych: This patient is capable of making decisions on his own behalf.             SLP recommends additional diagnostics of higher level cognitive skills.  6. Skin/Wound Care: Wound care sacral decubitus with tunneling. , cont mattress overlay, sidelying recommended   -pack Aquacel Ag+ twice daily for WOC     -nutrition, turning, prevalon boots for heels   7. Fluids/Electrolytes/Nutrition: Routine in and outs 8.  Anemia/melena.  Continue Aranesp  Hgb 8.6 on 1/4 9.  Tracheostomy 08/22/2019.  Decannulated.  Diet advanced to regular.  Continue nebulizers as directed 10.  MSSA bacteremia/pneumonia.  Antibiotic therapy completed 11.  Hyperlipidemia.  Lipitor 12.  Post stroke oropharyngeal dysphagia: Appreciate speech therapy recommendations; regular diet with oral care BID  -MMW for thrush 13.  Renal failure after sepsis-resolveing per nephro no need for HD Creat stable at 314.  Infected sacral decub- no I and D per gen surg    -afebrile  No leukocytosis  - serratia marcesens growing from wound S to ceftriaxone  -Bacteroides growing out as well but no susceptibility reported as rec per Pharm,   on metronidazole for anaerobic coverage   Continue to monitor 15.  Sleep disturbance no current c/os  Melatonin started on 12/20 16. Constipation:  12/26: Continue senna-docusate BID. Made Miralax standing.    -large bm 12/27  12/29: No BM since 12/27 and patient feels constipated. Will make Miralax BID.  1/1: Needs to have BM now. 17. Hypokalemia. 1/4: 3.7 recheck labs Monday , no diuretic on board   LOS: 25 days A FACE TO [place] E Jhovany Weidinger 10/28/2019, 7:41 AM

## 2019-10-28 NOTE — Progress Notes (Signed)
Occupational Therapy Weekly Progress Note  Patient Details  Name: [name] MRN: [id number] Date of Birth: 1951/06/17  Beginning of progress report period: October 20, 2019 End of progress report period: October 28, 2019  Today's Date: 10/28/2019 OT Individual Time: 6195-0932 OT Individual Time Calculation (min): 70 min    Patient has met 3 of 3 short term goals. Pt making steady progress towards goals this week. Pt has demonstrated ability to perform mod A slide board transfer consistently. Pt able to sit <>stand for short periods of time with mod - max A during self care tasks. No pain reported in L UE this week during therapy sessions. Pt able to perform UB self care with mod A and LB self care with max - total A. Toileting able to be completed with assistance of one person but pt needing assist with hygiene and clothing management.   Patient continues to demonstrate the following deficits: muscle weakness, decreased cardiorespiratoy endurance, decreased coordination and decreased motor planning, decreased problem solving, decreased safety awareness and decreased memory and decreased sitting balance, decreased standing balance, decreased postural control, hemiplegia and decreased balance strategies and therefore will continue to benefit from skilled OT intervention to enhance overall performance with BADL and Reduce care partner burden.  Patient progressing toward long term goals..  Continue plan of care.  OT Short Term Goals Week 3:  OT Short Term Goal 1 (Week 3): Pt will complete BSC or toilet transfer with 1 assist using LRAD OT Short Term Goal 1 - Progress (Week 3): Met OT Short Term Goal 2 (Week 3): Pt will complete 2 grooming tasks EOB with no more than Min A for balance OT Short Term Goal 2 - Progress (Week 3): Met OT Short Term Goal 3 (Week 3): Pt will complete UB dressing with Mod A OT Short Term Goal 3 - Progress (Week 3): Met Week 4:  OT Short Term Goal 1 (Week 4):  STGs=LTGs secondary to upcoming discharge  Skilled Therapeutic Interventions/Progress Updates:    Upon entering the room, pt supine in bed and reports having not slept well and very fatigued. He is also attempting to complete meal and needing repositioning for safety. Pt declined OOB activities this session and requesting HEP for L UE NMR. OT performed PROM, AAROM, and pt performed AROM for R UE in all planes of movement. Pt does have subluxation that caregiver needs education on. OT left paper handouts of all exercises performed during this session to be reviewed with caregiver when she is present. Pt falling asleep as therapist exited the room secondary to fatigue.   Therapy Documentation Precautions:  Precautions Precautions: Fall Precaution Comments: Sacral decubitus wounds, Lt hemi Restrictions Weight Bearing Restrictions: No    ADL: ADL Eating: Maximal assistance(for sitting balance) Where Assessed-Eating: Edge of bed Grooming: Maximal assistance Where Assessed-Grooming: Edge of bed Upper Body Bathing: Maximal assistance Where Assessed-Upper Body Bathing: Edge of bed Lower Body Bathing: Dependent(+2 assist) Where Assessed-Lower Body Bathing: Bed level Upper Body Dressing: Dependent Where Assessed-Upper Body Dressing: Edge of bed Lower Body Dressing: Dependent(+2 assist) Where Assessed-Lower Body Dressing: Bed level Toileting: Not assessed Toilet Transfer: Not assessed Tub/Shower Transfer: Not assessed Vision   Perception    Praxis   Exercises:   Other Treatments:     Therapy/Group: Individual Therapy  Gypsy Decant 10/28/2019, 10:29 AM

## 2019-10-28 NOTE — Progress Notes (Signed)
[facility] Progress Note  Subjective: no c/o today, no n/v or confusion.Wife present today.   Vitals:   10/27/19 1334 10/27/19 1944 10/27/19 1947 10/28/19 0543  BP: (!) 144/83 (!) 170/80  (!) 157/82  Pulse: (!) 115 (!) 110  (!) 101  Resp: 16 20  16   Temp: 98.6 F (37 C) 98.7 F (37.1 C)  98.9 F (37.2 C)  TempSrc: Oral   Oral  SpO2: 100% 100% 99% 99%  Height:        Exam: Gen: seen in room, in bed, calm and pleasant  CVS: Pulse regular tachycardia, S1 and S2 normal  Resp: Clear to auscultation bilaterally, no rales/rhonchi  Abd: Soft, obese, nontender  Ext: No lower extremity edema   Assessment/ Plan: 1. Acute renal failure/ AKI: had ATN/ AKI from COVID-related septic shock, started CRRT on 08/07/19 and converted to iHD on 08/26/19. Then started making urine so HD was held. Last HD 12/24. Creat peaked at 4.0 on 12.29 and is down to 3.0 today which may be his new baseline. Cont to hold HD. Check labs Wed. If labs stable will plan to remove HD catheter.   2. COVID-19 pneumonia with hypoxic respiratory failure and prolonged hospitalization/complications: trach is out and pt doing better, on rehab.  3. Anemia: Without overt loss, continue Aranesp for anemia of critical illness. With iron deficiency getting intravenous iron  4. CKD-MBD: With borderline elevated calcium levels when corrected for albumin, low phosphorus  5. Nutrition: Continue current renal diet with oral protein supplementation to promote healing of sacral decubitus  6. Hypertension: Euvolemic on physical exam, blood pressures acceptable.  7. Sacral decubitus: Ongoing aggressive wound management by wound care/surgery  Inpatient medications: . aspirin EC  81 mg Oral Daily  . atorvastatin  40 mg Oral q1800  . budesonide (PULMICORT) nebulizer solution  0.5 mg Nebulization BID  . chlorhexidine  15 mL Mouth/Throat BID  . Chlorhexidine Gluconate Cloth  6 each Topical BID  . diclofenac Sodium  2 g Topical  QID  . feeding supplement (NEPRO CARB STEADY)  237 mL Oral Q24H  . feeding supplement (PRO-STAT SUGAR FREE 64)  30 mL Oral BID  . heparin  5,000 Units Subcutaneous Q8H  . hydrocerin   Topical TID  . lidocaine  1 patch Transdermal Q24H  . magic mouthwash w/lidocaine  5 mL Oral QID  . multivitamin  1 tablet Oral QHS  . pantoprazole  40 mg Oral Q1200  . polyethylene glycol  17 g Oral BID  . ramelteon  8 mg Oral QHS  . senna-docusate  2 tablet Oral BID  . sucralfate  1 g Oral Q6H   . sodium chloride 10 mL/hr at 10/23/19 1527   sodium chloride, acetaminophen (TYLENOL) oral liquid 160 mg/5 mL, HYDROcodone-acetaminophen, ipratropium-albuterol, Melatonin, polyvinyl alcohol, sorbitol, white petrolatum

## 2019-10-29 ENCOUNTER — Inpatient Hospital Stay (HOSPITAL_COMMUNITY): Payer: Medicare HMO | Admitting: Physical Therapy

## 2019-10-29 ENCOUNTER — Inpatient Hospital Stay (HOSPITAL_COMMUNITY): Payer: Medicare HMO | Admitting: Speech Pathology

## 2019-10-29 ENCOUNTER — Encounter (HOSPITAL_COMMUNITY): Payer: Self-pay | Admitting: Physical Medicine & Rehabilitation

## 2019-10-29 ENCOUNTER — Inpatient Hospital Stay (HOSPITAL_COMMUNITY): Payer: Medicare HMO

## 2019-10-29 ENCOUNTER — Inpatient Hospital Stay (HOSPITAL_COMMUNITY): Payer: Medicare HMO | Admitting: Occupational Therapy

## 2019-10-29 HISTORY — PX: IR REMOVAL TUN CV CATH W/O FL: IMG2289

## 2019-10-29 LAB — RENAL FUNCTION PANEL
Albumin: 2.8 g/dL — ABNORMAL LOW (ref 3.5–5.0)
Anion gap: 11 (ref 5–15)
BUN: 32 mg/dL — ABNORMAL HIGH (ref 8–23)
CO2: 23 mmol/L (ref 22–32)
Calcium: 10.2 mg/dL (ref 8.9–10.3)
Chloride: 107 mmol/L (ref 98–111)
Creatinine, Ser: 2.86 mg/dL — ABNORMAL HIGH (ref 0.61–1.24)
GFR calc Af Amer: 25 mL/min — ABNORMAL LOW (ref >60)
GFR calc non Af Amer: 22 mL/min — ABNORMAL LOW (ref >60)
Glucose, Bld: 97 mg/dL (ref 70–99)
Phosphorus: 4.7 mg/dL — ABNORMAL HIGH (ref 2.5–4.6)
Potassium: 3.5 mmol/L (ref 3.5–5.1)
Sodium: 141 mmol/L (ref 135–145)

## 2019-10-29 MED ORDER — CHLORHEXIDINE GLUCONATE 4 % EX LIQD
CUTANEOUS | Status: AC
  Filled 2019-10-29: qty 15

## 2019-10-29 MED ORDER — LIDOCAINE HCL 1 % IJ SOLN
INTRAMUSCULAR | Status: AC
  Filled 2019-10-29: qty 20

## 2019-10-29 NOTE — Progress Notes (Signed)
[facility] PHYSICAL MEDICINE & REHABILITATION PROGRESS NOTE   Subjective/Complaints:   No sacral pain Not sleeping well but does not wish to try another sleep med   ROS: Patient denies CP, SOB, N/V/D  Objective:   No results found. Recent Labs    10/27/19 0503  WBC 5.4  HGB 8.6*  HCT 28.8*  PLT 371   Recent Labs    10/27/19 0503 10/29/19 0544  NA 140 141  K 3.7 3.5  CL 108 107  CO2 23 23  GLUCOSE 93 97  BUN 33* 32*  CREATININE 3.09* 2.86*  CALCIUM 10.0 10.2    Intake/Output Summary (Last 24 hours) at 10/29/2019 0656 Last data filed at 10/29/2019 0532 Gross per 24 hour  Intake 340 ml  Output 775 ml  Net -435 ml     Physical Exam: Vital Signs Blood pressure 139/86, pulse 100, temperature 98.4 F (36.9 C), temperature source Oral, resp. rate 16, height _0  (1.778 m), weight 81 kg, SpO2 97 %. Constitutional: No distress . Labs and Vital signs reviewed. HEENT: EOMI, oral membranes moist Neck: supple Cardiovascular: tachy 100-110 without murmur. No JVD    Respiratory: CTA Bilaterally without wheezes or rales. Normal effort    GI: BS +, non-tender, non-distended  Skin: Warm and dry.  Intact. Has sacral draining sinus- 6" aquacel pack, no surrounding erythema Psych: extremely pleasant Musc: No edema in extremities.  No tenderness in extremities. Neuro: Alert Dysphonia, unchanged  Motor: RUE/RLE: 4/5 proximal distal LUE: Shoulder abduction, elbow flexion/extension 4-/5, handgrip 0/5--stable exam  LLE: Hip flexion, knee extension 4-/5, ankle dorsiflexion 0/5---stable  Assessment/Plan: 1. Functional deficits secondary to Left hemiparesis from RIght parietal infarct   which require 3+ hours per day of interdisciplinary therapy in a comprehensive inpatient rehab setting.  Physiatrist is providing close team supervision and 24 hour management of active medical problems listed below.  Physiatrist and rehab team continue to assess barriers to discharge/monitor  patient progress toward functional and medical goals  Care Tool:  Bathing    Body parts bathed by patient: Chest, Abdomen, Front perineal area, Right upper leg, Left upper leg, Face, Left arm   Body parts bathed by helper: Right arm, Buttocks, Right lower leg, Left lower leg     Bathing assist Assist Level: Moderate Assistance - Patient 50 - 74%     Upper Body Dressing/Undressing Upper body dressing   What is the patient wearing?: Pull over shirt    Upper body assist Assist Level: Maximal Assistance - Patient 25 - 49%    Lower Body Dressing/Undressing Lower body dressing      What is the patient wearing?: Pants     Lower body assist Assist for lower body dressing: Total Assistance - Patient < 25%     Toileting Toileting Toileting Activity did not occur Landscape architect and hygiene only): N/A (no void or bm)  Toileting assist Assist for toileting: Maximal Assistance - Patient 25 - 49% Assistive Device Comment: urinal    Transfers Chair/bed transfer  Transfers assist  Chair/bed transfer activity did not occur: Safety/medical concerns  Chair/bed transfer assist level: Maximal Assistance - Patient 25 - 49%     Locomotion Ambulation   Ambulation assist   Ambulation activity did not occur: Safety/medical concerns  Assist level: 2 helpers Assistive device: Other (comment)(rail in hall) Max distance: 4   Walk 10 feet activity   Assist  Walk 10 feet activity did not occur: Safety/medical concerns        Walk  50 feet activity   Assist Walk 50 feet with 2 turns activity did not occur: Safety/medical concerns         Walk 150 feet activity   Assist Walk 150 feet activity did not occur: Safety/medical concerns         Walk 10 feet on uneven surface  activity   Assist Walk 10 feet on uneven surfaces activity did not occur: Safety/medical concerns         Wheelchair     Assist   Type of Wheelchair: Manual    Wheelchair assist  level: Minimal Assistance - Patient > 75% Max wheelchair distance: 100    Wheelchair 50 feet with 2 turns activity    Assist        Assist Level: Minimal Assistance - Patient > 75%   Wheelchair 150 feet activity     Assist      Assist Level: Dependent - Patient 0%   Blood pressure 139/86, pulse 100, temperature 98.4 F (36.9 C), temperature source Oral, resp. rate 16, height _0  (1.778 m), weight 81 kg, SpO2 97 %.  Medical Problem List and Plan: 1.  Debility secondary to acute hypoxemic RIght parietal infarct with Left hemiparesis LE>UE   Continue CIR- PT, OT, SLP Team conference today please see physician documentation under team conference tab, met with team face-to-face to discuss problems,progress, and goals. Formulized individual treatment plan based on medical history, underlying problem and comorbidities., extended stay by 7 d given potential to exceed original goals and reduce care needs  2.  Antithrombotics: -DVT/anticoagulation: Subcutaneous heparin. Negative  vascular study             -antiplatelet therapy: Aspirin 81 mg daily 3. Pain Management: Hycet as needed,some sacral pain but not severe     1/1: well controlled.  4. Mood: Provide emotional support. Rozerem 8 mg nightly             -antipsychotic agents: N/A 5. Neuropsych: This patient is capable of making decisions on his own behalf.             SLP recommends additional diagnostics of higher level cognitive skills.  6. Skin/Wound Care: Wound care sacral decubitus with tunneling. , cont mattress overlay, sidelying recommended   -pack Aquacel Ag+ twice daily for WOC     -nutrition, turning, prevalon boots for heels   7. Fluids/Electrolytes/Nutrition: Routine in and outs 8.  Anemia/melena.  Continue Aranesp  Hgb 8.6 on 1/4 9.  Tracheostomy 08/22/2019.  Decannulated.  Diet advanced to regular.  Continue nebulizers as directed 10.  MSSA bacteremia/pneumonia.  Antibiotic therapy completed 11.   Hyperlipidemia.  Lipitor 12.  Post stroke oropharyngeal dysphagia: Appreciate speech therapy recommendations; regular diet with oral care BID  -MMW for thrush 13.  Renal failure after sepsis-resolveing per nephro no need for HD Creat stable at 314.  Infected sacral decub- no I and D per gen surg   -afebrile  No leukocytosis  - completed abx    Continue to monitor 15.  Sleep disturbance no current c/os  Melatonin started on 12/20 16. Constipation:  12/26: Continue senna-docusate BID. Made Miralax standing.    -large bm 12/27  12/29: No BM since 12/27 and patient feels constipated. Will make Miralax BID.  1/1: Needs to have BM now. 17. Hypokalemia. 1/4: 3.7 recheck labs Monday , no diuretic on board   LOS: 26 days A FACE TO [place] E Kirsteins 10/29/2019, 6:56 AM

## 2019-10-29 NOTE — Procedures (Signed)
Pre procedural Dx: AKI Post procedural Dx: Same  Successful removal of tunneled left IJ HD catheter  EBL: <1 mL No immediate complications.  Hemostasis achieved with manual pressure. 4 x 4 gauze + Tegaderm applied - routine wound care until healed. May shower 24H post catheter removal.   Please see imaging section of Epic for full dictation.  Joaquim Nam PA-C 10/29/2019 11:00 AM

## 2019-10-29 NOTE — Progress Notes (Signed)
Speech Language Pathology Daily Session Note  Patient Details  Name: [name] MRN: [id number] Date of Birth: 06-01-1951  Today's Date: 10/29/2019 SLP Individual Time: 6720-9470 SLP Individual Time Calculation (min): 25 min  Short Term Goals: Week 4: SLP Short Term Goal 1 (Week 4): Patient will complete higher level reasoning and executive functioning tasks with 90% accuracy and supervision cues. SLP Short Term Goal 2 (Week 4): Patient will utilize external aids to recall fucntional informatin in 75% of opportunities with Min A verbal cues. SLP Short Term Goal 3 (Week 4): Patient will perform 25 repetitions of EMST/IMST exercises with overall supervision verbal cues for accuracy with a self-perceived effort level of 8/10. SLP Short Term Goal 4 (Week 4): Patient will utilize speech intelligibility strategies at the sentence level to achieve ~90% intelligibility with Mod I.  Skilled Therapeutic Interventions: Skilled treatment session focused on cognitive goals. SLP facilitated session by administering the Cognistat for reassessment of cognitive skills/progress. Patent scored WFL on all subtests with the exception of mild impairments in short-term memory and moderate impairments in judgement. SLP provided education in regards to details of assessment, patient verbalized understanding and agreement. Patient left supine in bed with alarm on and all needs within reach. Continue with current plan of care.      Pain No/Denies Pain   Therapy/Group: Individual Therapy  Deion Forgue 10/29/2019, 3:10 PM

## 2019-10-29 NOTE — Progress Notes (Signed)
Physical Therapy Session Note  Patient Details  Name: [name] MRN: [id number] Date of Birth: February 05, 1951  Today's Date: 10/29/2019 PT Individual Time: 0910-1005 PT Individual Time Calculation (min): 55 min   Short Term Goals: Week 4:  PT Short Term Goal 1 (Week 4): pt will transfer to and from [facility] with min assist consistantly with LRAD PT Short Term Goal 2 (Week 4): Pt will ambulate 70ft with max assist + 2 for WC follow PT Short Term Goal 3 (Week 4): Pt will propell WC >11ft with supervision assist PT Short Term Goal 4 (Week 4): Pt will tolerate standing up to 1 minute with mod assist  Skilled Therapeutic Interventions/Progress Updates: Pt presented in bed agreeable to therapy. Pt stating some pain at sacrum but no intervention requested. Pt performed rolling L/R with CGA and use of bed features to don new brief and thread pants. PTA threaded pants total A for time management. In supine pt performed bridging with PTA supporting B feet and pt was able to pull pants over hips. Pt then performed supine to sit with use of bed features with minA then PTA donned shoes total A. Pt performed STS with minA in Stedy and transferred to w/c. Pt then performed oral hygiene at sink with set up. Pt transported to ADL apt to work on bed mobility from standard bed. Pt performed Stedy transfer to R side of bed (use of Stedy for time management). Pt required minA and mod multimodal cues for sit<>supine with minA for LLE management on sit to supine and minA for rotation of L shoulder to R. Pt was able to push up to sitting with CGA and verbal cues. Pt returned to supine and performed segmental bridging to cross bed. Pt then performed same transfer requiring modA from standard bed. Pt then performed Stedy transfer to w/c and was able to maintain standing for approx 30 sec. Pt transported back to room at end of session and transferred back to bed in same manner as prior. Pt left in bed with call bell within reach and  needs me.t      Therapy Documentation Precautions:  Precautions Precautions: Fall Precaution Comments: Sacral decubitus wounds, Lt hemi Restrictions Weight Bearing Restrictions: No General:   Vital Signs:   Pain: Pain Assessment Pain Scale: 0-10 Pain Score: 0-No pain   Therapy/Group: Individual Therapy  Amyria Komar  Mong Neal, PTA  10/29/2019, 12:45 PM

## 2019-10-29 NOTE — Patient Care Conference (Signed)
Inpatient RehabilitationTeam Conference and Plan of Care Update Date: 10/29/2019   Time: 10:00 AM    Patient Name: [name]      Medical Record Number: [id number]  Date of Birth: 1951/09/20 Sex: Male         Room/Bed: 4W03C/4W03C-01 Payor Info: Payor: [employer] MEDICARE / Plan: [place] HMO / Product Type: *No Product type* /    Admit Date/Time:  10/03/2019  2:27 PM  Primary Diagnosis:  Debility  Patient Active Problem List   Diagnosis Date Noted  . Sleep disturbance   . Anemia of chronic disease   . Dependent on hemodialysis ([place])   . Dysphagia, post-stroke   . Pressure injury of sacral region, unstageable ([place])   . Oropharyngeal dysphagia 10/03/2019  . Debility 10/03/2019  . Acute blood loss anemia 09/06/2019  . H. pylori infection 09/06/2019  . CVA (cerebral vascular accident) ([place]) 09/06/2019  . Anoxic brain injury ([place]) 09/06/2019  . MSSA bacteremia 09/06/2019  . Positive D dimer 09/06/2019  . Pseudomonas infection 09/06/2019  . Infection due to acinetobacter baumannii 09/06/2019  . Sinus tachycardia 09/05/2019  . Advanced care planning/counseling discussion   . Goals of care, counseling/discussion   . Palliative care by specialist   . Cerebral thrombosis with cerebral infarction 08/28/2019  . Tracheostomy status ([place])   . Acute respiratory distress syndrome (ARDS) due to COVID-19 virus ([place])   . Chest tube in place   . Primary spontaneous pneumothorax   . Acute respiratory failure ([place])   . Gastric ulcer with hemorrhage 08/10/2019  . Constipation 08/09/2019  . Atrial fibrillation with RVR ([place]) 08/07/2019  . Acute renal failure (ARF) ([place]) 08/06/2019  . GI bleed 08/06/2019  . Pneumothorax on right   . Fluid overload 07/27/2019  . Pneumonia due to COVID-19 virus 07/25/2019  . Acute respiratory failure due to COVID-19 ([place]) 07/25/2019  . AKI (acute kidney injury) ([place]) 07/25/2019  . Dyspepsia 11/26/2014  . BPH (benign prostatic hyperplasia) 10/31/2013   . Achilles rupture 04/05/2011  . S/P Achilles tendon repair 04/05/2011    Expected Discharge Date: Expected Discharge Date: 11/07/19  Team Members Present: Physician leading conference: Dr. Alysia Penna Social Worker Present: Lennart Pall, LCSW Nurse Present: Dorthula Nettles, RN;Deborah Hervey Ard, RN Case Manager: Karene Fry, RN PT Present: Barrie Folk, PT;Rosita Dechalus, PTA OT Present: Laverle Hobby, OT SLP Present: Weston Anna, SLP PPS Coordinator present : Gunnar Fusi, Novella Olive, PT     Current Status/Progress Goal Weekly Team Focus  Bowel/Bladder   Occasional episodes of incontinence bowel. Uses the urinal.  Fewer episodes of incontinence.  assess bowwel and blaader needs qshift and PRN   Swallow/Nutrition/ Hydration             ADL's   Can stand with max A and use of RW for short periods, UB self care min - mod A, LB self care total A, mod A slide board transfers  mod A overall  NMR, ADL retraining, sitting balance, trunk control, functional transfers, cogn, pt/family edu   Mobility   minA bed mobiltiy with use of bed features to L. SBA sitting balance, CGA SB transfer to R on level surface, minA uphill and to R  minA overall  standing tolerance, pre-gait/gait activities   Communication   speech intelligibility to increase vocal intensity  Supervision  RMT   Safety/Cognition/ Behavioral Observations  Min A with complex problem sovling  Superivison A  complex problem solving/executive function   Pain   No c/on pain  remain  pain free  assess pain qshift and PRN   Skin   Wound to coccyx with tunnelling packed with iodoform BID  no new areas of skin break down, no infection, improve coccyx wound  assess skin qshift and PRN    Rehab Goals Patient on target to meet rehab goals: Yes *See Care Plan and progress notes for long and short-term goals.     Barriers to Discharge  Current Status/Progress Possible Resolutions Date Resolved   Nursing                   PT                    OT                  SLP                SW                Discharge Planning/Teaching Needs:  Wife has been in to observe some therapies, she is aware of his need for 24 hr physical care at DC. Neuro-psych seeing for coping  Teaching still to be planned.   Team Discussion: Off IV abx, no HD since 10/16/19, creatinine going down, sacral wound somewhat better.  RN packing wound, in IR to remove tunneled catheter, doing well, inc at times.  OT mod A slideboard, UB self care mod A, total A LB self care, toileting goals mod A.  PT min bed, min/CGA level slide board, mod A sit to stand, limited endurance, building a ramp, amb at rail 4-12'.  SLP speech work, min A problem solving and cognition.   Revisions to Treatment Plan: N/A     Medical Summary Current Status: Pain is resolved, finished IV antibiotics for sacral infection, no longer needing dialysis Weekly Focus/Goal: Discharge planning family teaching, renal follow-up with daily labs  Barriers to Discharge: Wound care   Possible Resolutions to Barriers: Family will need to learn wound care as well as transfer training   Continued Need for Acute Rehabilitation Level of Care: The patient requires daily medical management by a physician with specialized training in physical medicine and rehabilitation for the following reasons: Direction of a multidisciplinary physical rehabilitation program to maximize functional independence : Yes Medical management of patient stability for increased activity during participation in an intensive rehabilitation regime.: Yes Analysis of laboratory values and/or radiology reports with any subsequent need for medication adjustment and/or medical intervention. : Yes   I attest that I was present, lead the team conference, and concur with the assessment and plan of the team.   Retta Diones 10/29/2019, 2:22 PM   Team conference was held via web/ teleconference due to [place] -  19

## 2019-10-29 NOTE — Progress Notes (Signed)
Spoke with patient RN regarding removal of tunneled HD cath. IV RN instructed RN to call IR for HD cath removal due to it being tunneled. Patient RN verbalized understanding.

## 2019-10-29 NOTE — Progress Notes (Signed)
Renal Navigator spoke with Dr. Kruska/Nephrologist who states patient has recovered from AKI at this point to stop HD and no longer needs OP HD seat. He will follow up as a [facility] office patient. Renal Navigator cancelled OP HD seat at [facility].  Alphonzo Cruise, [place] Renal Navigator [phone]

## 2019-10-29 NOTE — Progress Notes (Signed)
Occupational Therapy Session Note  Patient Details  Name: [name] MRN: [id number] Date of Birth: 04/04/1951  Today's Date: 10/29/2019 OT Individual Time: 1300-1415 OT Individual Time Calculation (min): 75 min    Short Term Goals: Week 1:  OT Short Term Goal 1 (Week 1): Pt will complete toilet or BSC transfer with 2 assist for OOB toileting OT Short Term Goal 1 - Progress (Week 1): Progressing toward goal OT Short Term Goal 2 (Week 1): Pt will complete 1 grooming task w/c level at sink with mod vcs to include Lt limb OT Short Term Goal 2 - Progress (Week 1): Met OT Short Term Goal 3 (Week 1): Pt will don overhead shirt and initiate threading L UE first OT Short Term Goal 3 - Progress (Week 1): Progressing toward goal  Skilled Therapeutic Interventions/Progress Updates:    1:1 Pt in bed when arrived. Pt reported being incontient of bowel; couldn't wait for assistance. Pants donned and hygiene performed with total A. Pt able to roll in bed with bed rails with min A. Bandage on bottom needed to be changed due to being soiled. Nursing came to change.  Pt came to EOB on the left side with mod A with extra time to achieve fully upright sitting posture. Pt able to sit EOB with supervision (with bed inflated for comfort). Engaged in NMR to left UE. UE continues to be swollen where HD graph is present. Engage in shoulder shugs, scapular retraction and protraction with UE supported, scapular stabilization, isometric exercises with squeezing a ball with bilateral hands and holding UE in positions against gravity, AAROM with support against gravity.   Also performed lateral trunk exercises shortening and lengthening with left UE positioned up on large therapy ball. Required A to achieve due to incoordination.   Returned to supine and continued with some isometric exercises against gravity with some support for scapular stabilization.   Pt left resting in the bed.   Therapy  Documentation Precautions:  Precautions Precautions: Fall Precaution Comments: Sacral decubitus wounds, Lt hemi Restrictions Weight Bearing Restrictions: No Pain: Reports some pain in left UE but able tolerate therapy session   Therapy/Group: Individual Therapy  ,  [place] 10/29/2019, 7:52 PM 

## 2019-10-29 NOTE — Progress Notes (Signed)
Patient refused today's Lidocaine patch, magic mouthwash, and miralax. Informed Dr. Letta Pate during team conference.

## 2019-10-29 NOTE — Progress Notes (Signed)
Physical Therapy Session Note  Patient Details  Name: [name] MRN: [id number] Date of Birth: 02-Mar-1951  Today's Date: 10/29/2019     Short Term Goals: Week 4:  PT Short Term Goal 1 (Week 4): pt will transfer to and from [facility] with min assist consistantly with LRAD PT Short Term Goal 2 (Week 4): Pt will ambulate 65ft with max assist + 2 for WC follow PT Short Term Goal 3 (Week 4): Pt will propell WC >138ft with supervision assist PT Short Term Goal 4 (Week 4): Pt will tolerate standing up to 1 minute with mod assist  Skilled Therapeutic Interventions/Progress Updates:  Pt received supine in bed and reports that he is too tired and sore from procedure earlier in the day for PT treatment. PT educated on benefits for continued physical activity, but requested to wait until tomorrow for any more physical therapy.      Therapy Documentation Precautions:  Precautions Precautions: Fall Precaution Comments: Sacral decubitus wounds, Lt hemi Restrictions Weight Bearing Restrictions: No    Vital Signs: Therapy Vitals Temp: (!) 97.4 F (36.3 C) Temp Source: Axillary Pulse Rate: (!) 118 Resp: 18 BP: (!) 173/91 Patient Position (if appropriate): Sitting Oxygen Therapy SpO2: 100 % O2 Device: Room Air    Therapy/Group: Individual Therapy  [name] [name] 10/29/2019, 5:18 PM

## 2019-10-29 NOTE — Progress Notes (Signed)
[facility] Progress Note  Subjective:Feels well.  No new issues  Vitals:   10/28/19 2040 10/29/19 0531 10/29/19 0811 10/29/19 1328  BP:  139/86  (!) 173/91  Pulse:  100  (!) 118  Resp:  16  18  Temp:  98.4 F (36.9 C)  (!) 97.4 F (36.3 C)  TempSrc:  Oral  Axillary  SpO2: 99% 97% 98% 100%  Height:        Exam: Gen: seen in room, in bed, calm and pleasant  CVS: Pulse regular tachycardia, S1 and S2 normal  Resp: Clear to auscultation bilaterally, no rales/rhonchi  Abd: Soft, obese, nontender  Ext: No lower extremity edema, LUE with 2+ pitting edema  Assessment/ Plan: 1. Acute renal failure/ AKI: appears had CKD 3 at baseline 1.5-2. Had ATN/ AKI from COVID-related septic shock, started CRRT on 08/07/19 and converted to iHD on 08/26/19. Then started making urine so HD was held. Last HD 12/24. Creat peaked at 4.0 on 12.29 and is down to 2.9 today. Remove HD catheter.  Will sign off as it appears he now has CKD which is continuing to improve.  I wouldn't be surprised if his new baseline is near this level but we may see some additional improvement.  Will arrange for follow up in nephrology clinic in 6 weeks with myself.  2. COVID-19 pneumonia with hypoxic respiratory failure and prolonged hospitalization/complications: trach is out and pt doing better, on rehab.  3. Anemia: s/p IV iron load. Receiving aranesp - last dose 1/2.   Hb into high 8s now.  Will likely benefit from outpt ESA, will refer when see him in f/u visit.  4. CKD-MBD: With borderline elevated calcium levels when corrected for albumin, phosphorus normal; PTH 10/07/19 28 5. Nutrition: Continue current renal diet with oral protein supplementation to promote healing of sacral decubitus  6. Hypertension: Euvolemic on physical exam, blood pressures acceptable.  7. Sacral decubitus: Ongoing aggressive wound management by wound care/surgery 8. LUE Edema: says present for a few weeks. BB AVF created 12/21 and had [facility]  TDC.  The [facility] is out and I'm going to give the arm a few days to improve but if the swelling persists he'll need further evaluation.    Inpatient medications: . aspirin EC  81 mg Oral Daily  . atorvastatin  40 mg Oral q1800  . budesonide (PULMICORT) nebulizer solution  0.5 mg Nebulization BID  . chlorhexidine      . chlorhexidine  15 mL Mouth/Throat BID  . Chlorhexidine Gluconate Cloth  6 each Topical BID  . diclofenac Sodium  2 g Topical QID  . feeding supplement (NEPRO CARB STEADY)  237 mL Oral Q24H  . feeding supplement (PRO-STAT SUGAR FREE 64)  30 mL Oral BID  . heparin  5,000 Units Subcutaneous Q8H  . hydrocerin   Topical TID  . lidocaine  1 patch Transdermal Q24H  . lidocaine      . magic mouthwash w/lidocaine  5 mL Oral QID  . multivitamin  1 tablet Oral QHS  . pantoprazole  40 mg Oral Q1200  . polyethylene glycol  17 g Oral BID  . ramelteon  8 mg Oral QHS  . senna-docusate  2 tablet Oral BID  . sucralfate  1 g Oral Q6H   . sodium chloride 10 mL/hr at 10/23/19 1527   sodium chloride, acetaminophen (TYLENOL) oral liquid 160 mg/5 mL, HYDROcodone-acetaminophen, ipratropium-albuterol, Melatonin, polyvinyl alcohol, sorbitol, white petrolatum

## 2019-10-30 ENCOUNTER — Inpatient Hospital Stay (HOSPITAL_COMMUNITY): Payer: Medicare HMO

## 2019-10-30 ENCOUNTER — Inpatient Hospital Stay (HOSPITAL_COMMUNITY): Payer: Medicare HMO | Admitting: Physical Therapy

## 2019-10-30 ENCOUNTER — Inpatient Hospital Stay (HOSPITAL_COMMUNITY): Payer: Medicare HMO | Admitting: Speech Pathology

## 2019-10-30 ENCOUNTER — Inpatient Hospital Stay (HOSPITAL_COMMUNITY): Payer: Medicare HMO | Admitting: Occupational Therapy

## 2019-10-30 NOTE — Progress Notes (Signed)
Occupational Therapy Session Note  Patient Details  Name: Gustaf Mccarter MRN: [id number] Date of Birth: [birth year]  Today's Date: 10/30/2019 OT Individual Time: 1440-1535 OT Individual Time Calculation (min): 55 min    Short Term Goals: Week 4:  OT Short Term Goal 1 (Week 4): STGs=LTGs secondary to upcoming discharge  Skilled Therapeutic Interventions/Progress Updates:    Patient in bed, agreeable to getting up for OT session this afternoon.  significant edema noted left mid arm (above and below elbow - hand with mild edema)   Rolling left with CS, side lying to SSP with min A.  Sit to stand mod/max A.  SPT with RW (left hand support) with max A for trunk control and walker placement.  Provided arm support with increased elevation with pillow.  Oral care with set up assist.  Completed trunk and proximal control activities - subluxation reduces consistently with activation.  Weight bearing in various positions.  Reviewed and practiced hand placement and use of left UE with sit to squat position.  Completed AAROM shoulder to hand.   reviewed activities and positioning with wife.  Patient remained seated in w/c at close of session with wife present.    Therapy Documentation Precautions:  Precautions Precautions: Fall Precaution Comments: Sacral decubitus wounds, Lt hemi Restrictions Weight Bearing Restrictions: No General:   Vital Signs: Therapy Vitals Temp: 98.1 F (36.7 C) Pulse Rate: (!) 105 Resp: 19 BP: 125/77 Patient Position (if appropriate): Lying Oxygen Therapy SpO2: 100 % O2 Device: Room Air Pain: Pain Assessment Pain Scale: 0-10 Pain Score: 0-No pain  Therapy/Group: Individual Therapy  Carlos Levering 10/30/2019, 3:59 PM

## 2019-10-30 NOTE — Progress Notes (Signed)
Physical Therapy Session Note  Patient Details  Name: [name] MRN: [id number] Date of Birth: 07-Dec-1950  Today's Date: 10/30/2019 PT Individual Time: 0800-0900 and 1700-1730 PT Individual Time Calculation (min): 60 min and 30   Short Term Goals: Week 4:  PT Short Term Goal 1 (Week 4): pt will transfer to and from [facility] with min assist consistantly with LRAD PT Short Term Goal 2 (Week 4): Pt will ambulate 64f with max assist + 2 for WC follow PT Short Term Goal 3 (Week 4): Pt will propell WC >1513fwith supervision assist PT Short Term Goal 4 (Week 4): Pt will tolerate standing up to 1 minute with mod assist  Skilled Therapeutic Interventions/Progress Updates:  Session 1 Pt received supine in bed and agreeable to PT. Supine>sit transfer with min assist and min cues for sequencing and heavy use of rails on the R side of the bed. Sitting EOB PT donned shoes and LAFO for standing trail. Sit<>stand with mod assist 21 inches, and PT pulled pt's pants to waist with only min assist to maintain standing balance.   Stand pivot transfer with RW, AFO and mod assist for safety. Mild L knee instability and poor sequencing requiring cues and assist from PT to correct.   WC mobility with supervision assist using R hemi technique with min cues for awareness of obstacles on the L.  Stand pivot transfer [facility] mod assist, LAFO and RW. Bed mobility with min assist in to supine with assist at the LLE. ROlling R and L NMR for improved sequencing of movement through trunk and with LUE. Dynamic reversals to shoulder for Rolling each direction with max cues for decreased compensations from pulling.   Patient returned to room and [facility] in reach and all needs met.    Session 2.   Pt received supine in bed and agreeable to PT. Wife present for PT to initiate family education. Pt's wife reports that their bed is 30inches from the floor. PT strongly urged wife to consider  hospital bed, but pt adamant that they did not have enough room in the house for a hospital bed, and would make what they have work. PT instructed pt in supine>sit on the L side of the bed with min assist to stabilize trunk and facilitate LUE position. Sit<>stand from bed height at 25 inches with mod assist to pull pants to waist with LAFO and RW. SB transfer to and from [facility] min assist to [facility] max assist to return to bed at 30inches. Max multimodal cues for sequencing due to poor pelvic rotation to the L in transfer. Stand pivot transfer to and from [facility] with RW mod assist the R and max assist to the L after 3 attempts due to poor sequencing and motor planning.  Sit>supine completed with min assist at the LLE, and left supine in bed with call bell in reach and all needs met.            Therapy Documentation Precautions:  Precautions Precautions: Fall Precaution Comments: Sacral decubitus wounds, Lt hemi Restrictions Weight Bearing Restrictions: No Pain:    3/10 L shoulder. Pt repositioned.    Therapy/Group: Individual Therapy  AuLorie [name]/04/2020, 10:06 AM

## 2019-10-30 NOTE — Progress Notes (Signed)
Physical Therapy Session Note  Patient Details  Name: [name] MRN: [id number] Date of Birth: [birth year]  Today's Date: 10/30/2019 PT Individual Time: 1303-1330 PT Individual Time Calculation (min): 27 min   Short Term Goals: Week 1:  PT Short Term Goal 1 (Week 1): Pt will transfer to and from [facility] with max assist +2 and LRAD PT Short Term Goal 1 - Progress (Week 1): Met PT Short Term Goal 2 (Week 1): Pt will perform bed mobility with max assist of 1 PT Short Term Goal 2 - Progress (Week 1): Met PT Short Term Goal 3 (Week 1): Pt will maintain sitting balance for up to 5 mintues with mod  assist PT Short Term Goal 3 - Progress (Week 1): Met PT Short Term Goal 4 (Week 1): PT will perform sit<>stand with max assist +2 for BLE strengthening PT Short Term Goal 4 - Progress (Week 1): Met PT Short Term Goal 5 (Week 1): Pt will tolerate 5 min on tilt table for functional WB through BLE PT Short Term Goal 5 - Progress (Week 1): Met Week 2:  PT Short Term Goal 1 (Week 2): Pt will maintain sitting balance EOB x 5 minutes with min assist PT Short Term Goal 1 - Progress (Week 2): Met PT Short Term Goal 2 (Week 2): Pt will perform bed<>WC transfers with max assist of 1 intermittiently PT Short Term Goal 2 - Progress (Week 2): Met PT Short Term Goal 3 (Week 2): Pt will initiate WC propulsion PT Short Term Goal 3 - Progress (Week 2): Met PT Short Term Goal 4 (Week 2): Pt will initiate gait training with mechanical lift up to 10f PT Short Term Goal 4 - Progress (Week 2): Progressing toward goal Week 3:  PT Short Term Goal 1 (Week 3): Pt will transfer to and from bed with mod assist and LRAD with lateral scoot/squat pivot PT Short Term Goal 1 - Progress (Week 3): Met PT Short Term Goal 2 (Week 3): Pt will ambulate >157fwith max assist +2 and LRAD PT Short Term Goal 2 - Progress (Week 3): Progressing toward goal PT Short Term Goal 3 (Week 3): Pt will propell WC >10031fith supervision assist. PT  Short Term Goal 3 - Progress (Week 3): Met PT Short Term Goal 4 (Week 3): pt will maintain sitting balance EOB with supervision assist for improved independence with ADLs PT Short Term Goal 4 - Progress (Week 3): Met PT Short Term Goal 5 (Week 3): Pt will tolerate standing up to 1 min  with mod assist and LRAD PT Short Term Goal 5 - Progress (Week 3): Progressing toward goal  Skilled Therapeutic Interventions/Progress Updates:      Therapy Documentation Precautions:  Precautions Precautions: Fall Precaution Comments: Sacral decubitus wounds, Lt hemi Restrictions Weight Bearing Restrictions: No PAIN pt c/o pain Lshoulder, could not quantify but states it "feels like it is slipping out".  Provided UE w/support to approximate head of humerus in glenoid fossa throughtout rx session.  Pt initially supine in bed agreeable to rx.  Supine to sit on edge of bed w/mod assist w/rails.  STS using Stedy from raised bed (to point knees level w/hips) w/min to mod assist.  Stood 3 min/2 min w/mutimodal cues to facilitate activation of L quads and gluts in standing, encouragement to remain upright to improve tolerance/strength.  In sitting w/LUE supported performed LLE strengthening and coordination - L LAQ's w/lateral barriers provided for target due to ataxia.  Pt able to control  extension significantly when given barriers for focus.  Worked on scooting/boosting in sitting to L and R for improved functional mobility.  Required much cueing for ant wt shift/for body mechanics.  Sit to supine w/mod assist for LE mgmt. And use of rail.  Pt able to scoot using headboard without assist w/bed flat.   Pt left supine w/rails up x 3, alarm set, bed in lowest position, and needs in reach. Pt would benefit from use of sling to support LUE and prevent further subluxation.      Therapy/Group: Individual Therapy  Callie Fielding, Melody Hill 10/30/2019, 4:29 PM

## 2019-10-30 NOTE — Progress Notes (Signed)
Speech Language Pathology Daily Session Note  Patient Details  Name: [name] MRN: [id number] Date of Birth: 1951/09/20  Today's Date: 10/30/2019 SLP Individual Time: 1000-1055 SLP Individual Time Calculation (min): 55 min  Short Term Goals: Week 4: SLP Short Term Goal 1 (Week 4): Patient will complete higher level reasoning and executive functioning tasks with 90% accuracy and supervision cues. SLP Short Term Goal 2 (Week 4): Patient will utilize external aids to recall fucntional informatin in 75% of opportunities with Min A verbal cues. SLP Short Term Goal 3 (Week 4): Patient will perform 25 repetitions of EMST/IMST exercises with overall supervision verbal cues for accuracy with a self-perceived effort level of 8/10. SLP Short Term Goal 4 (Week 4): Patient will utilize speech intelligibility strategies at the sentence level to achieve ~90% intelligibility with Mod I.  Skilled Therapeutic Interventions: Skilled treatment session focused on cognitive goals. SLP facilitated session by providing Min A verbal cues for problem solving and organization during a mildly complex calendar/appointment making task. Patient also performed 25 repetitions of RMT exercises with supervision verbal cues for accuracy. Patient left upright in bed with alarm on and all needs within reach. Continue with current plan of care.      Pain No/Denies Pain   Therapy/Group: Individual Therapy  [name] 10/30/2019, 3:57 PM

## 2019-10-30 NOTE — Progress Notes (Signed)
Orthopedic Tech Progress Note Patient Details:  [name] 1951/04/20 [id number] Called in order to [employer] for AFO Patient ID: [name], male   DOB: September 25, 1951, 69 y.o.   MRN: [id number]   Janit Pagan 10/30/2019, 10:14 AM

## 2019-10-30 NOTE — Progress Notes (Signed)
PHYSICAL MEDICINE & REHABILITATION PROGRESS NOTE   Subjective/Complaints:   Temp dialysis cath removed yesterday , some puffiness noted at exit site   ROS: Patient denies CP, SOB, N/V/D  Objective:   IR Removal Tun Cv Cath W/O FL  Result Date: 10/29/2019 INDICATION: Patient with acute renal failure secondary to COVID related septic shock previously requiring dialysis now with improved renal function. Request to IR for removal of tunneled left IJ hemodialysis catheter. EXAM: REMOVAL OF TUNNELED HEMODIALYSIS CATHETER MEDICATIONS: 6 mL 1% lidocaine COMPLICATIONS: None immediate. PROCEDURE: Informed written consent was obtained from the patient following an explanation of the procedure, risks, benefits and alternatives to treatment. A time out was performed prior to the initiation of the procedure. Maximal barrier sterile technique was utilized including caps, mask, sterile gowns, sterile gloves, large sterile drape, hand hygiene, and Hibiclens. 1% lidocaine was injected under sterile conditions along the subcutaneous tunnel. Utilizing a combination of blunt dissection and gentle traction, the catheter was removed intact. Hemostasis was obtained with manual compression. A dressing was placed. The patient tolerated the procedure well without immediate post procedural complication. IMPRESSION: Successful removal of tunneled dialysis catheter. Read by Candiss Norse, PA-C Electronically Signed   By: Jacqulynn Cadet M.D.   On: 10/29/2019 11:02   No results for input(s): WBC, HGB, HCT, PLT in the last 72 hours. Recent Labs    10/29/19 0544  NA 141  K 3.5  CL 107  CO2 23  GLUCOSE 97  BUN 32*  CREATININE 2.86*  CALCIUM 10.2    Intake/Output Summary (Last 24 hours) at 10/30/2019 0748 Last data filed at 10/29/2019 1831 Gross per 24 hour  Intake 580 ml  Output 200 ml  Net 380 ml     Physical Exam: Vital Signs Blood pressure (!) 150/71, pulse (!) 103, temperature 98.4 F (36.9  C), resp. rate 18, height 5\' 10"  (1.778 m), weight 81 kg, SpO2 96 %. Constitutional: No distress . Labs and Vital signs reviewed. HEENT: EOMI, oral membranes moist Neck: supple Cardiovascular: tachy 100-110 without murmur. No JVD    Respiratory: CTA Bilaterally without wheezes or rales. Normal effort    GI: BS +, non-tender, non-distended  Skin: Warm and dry.  Intact. Has sacral draining sinus- 6" aquacel pack, no surrounding erythema Psych: extremely pleasant Musc: No edema in extremities.  No tenderness in extremities. Neuro: Alert Dysphonia, unchanged  Motor: RUE/RLE: 4/5 proximal distal LUE: Shoulder abduction, elbow flexion/extension 4-/5, handgrip 0/5--stable exam  LLE: Hip flexion, knee extension 4-/5, ankle dorsiflexion 0/5---stable  Assessment/Plan: 1. Functional deficits secondary to Left hemiparesis from RIght parietal infarct   which require 3+ hours per day of interdisciplinary therapy in a comprehensive inpatient rehab setting.  Physiatrist is providing close team supervision and 24 hour management of active medical problems listed below.  Physiatrist and rehab team continue to assess barriers to discharge/monitor patient progress toward functional and medical goals  Care Tool:  Bathing    Body parts bathed by patient: Chest, Abdomen, Front perineal area, Right upper leg, Left upper leg, Face, Left arm   Body parts bathed by helper: Right arm, Buttocks, Right lower leg, Left lower leg     Bathing assist Assist Level: Moderate Assistance - Patient 50 - 74%     Upper Body Dressing/Undressing Upper body dressing   What is the patient wearing?: Pull over shirt    Upper body assist Assist Level: Maximal Assistance - Patient 25 - 49%    Lower Body Dressing/Undressing Lower body  dressing      What is the patient wearing?: Pants     Lower body assist Assist for lower body dressing: Total Assistance - Patient < 25%     Toileting Toileting Toileting Activity  did not occur Landscape architect and hygiene only): N/A (no void or bm)  Toileting assist Assist for toileting: Maximal Assistance - Patient 25 - 49% Assistive Device Comment: urinal    Transfers Chair/bed transfer  Transfers assist  Chair/bed transfer activity did not occur: Safety/medical concerns  Chair/bed transfer assist level: Maximal Assistance - Patient 25 - 49%     Locomotion Ambulation   Ambulation assist   Ambulation activity did not occur: Safety/medical concerns  Assist level: 2 helpers Assistive device: Other (comment)(rail in hall) Max distance: 4   Walk 10 feet activity   Assist  Walk 10 feet activity did not occur: Safety/medical concerns        Walk 50 feet activity   Assist Walk 50 feet with 2 turns activity did not occur: Safety/medical concerns         Walk 150 feet activity   Assist Walk 150 feet activity did not occur: Safety/medical concerns         Walk 10 feet on uneven surface  activity   Assist Walk 10 feet on uneven surfaces activity did not occur: Safety/medical concerns         Wheelchair     Assist   Type of Wheelchair: Manual    Wheelchair assist level: Minimal Assistance - Patient > 75% Max wheelchair distance: 100    Wheelchair 50 feet with 2 turns activity    Assist        Assist Level: Minimal Assistance - Patient > 75%   Wheelchair 150 feet activity     Assist      Assist Level: Dependent - Patient 0%   Blood pressure (!) 150/71, pulse (!) 103, temperature 98.4 F (36.9 C), resp. rate 18, height 5\' 10"  (1.778 m), weight 81 kg, SpO2 96 %.  Medical Problem List and Plan: 1.  Debility secondary to acute hypoxemic RIght parietal infarct with Left hemiparesis LE>UE   Continue CIR- PT, OT, SLP , extended stay by 7 d given potential to exceed original goals and reduce care needs  2.  Antithrombotics: -DVT/anticoagulation: Subcutaneous heparin. Negative  vascular study              -antiplatelet therapy: Aspirin 81 mg daily 3. Pain Management: Hycet as needed,some sacral pain but not severe    1/7 controlled  4. Mood: Provide emotional support. Rozerem 8 mg nightly             -antipsychotic agents: N/A 5. Neuropsych: This patient is capable of making decisions on his own behalf.             SLP recommends additional diagnostics of higher level cognitive skills.  6. Skin/Wound Care: Wound care sacral decubitus with tunneling. , cont mattress overlay, sidelying recommended   -pack Aquacel Ag+ twice daily for WOC     -nutrition, turning, prevalon boots for heels   7. Fluids/Electrolytes/Nutrition: Routine in and outs 8.  Anemia/melena.  Continue Aranesp  Hgb 8.6 on 1/4 9.  Tracheostomy 08/22/2019.  Decannulated.  Diet advanced to regular.  Continue nebulizers as directed 10.  MSSA bacteremia/pneumonia.  Antibiotic therapy completed 11.  Hyperlipidemia.  Lipitor 12.  Post stroke oropharyngeal dysphagia: Appreciate speech therapy recommendations; regular diet with oral care BID  -MMW for thrush 13.  Renal failure after sepsis-resolveing per nephro no need for HD Creat stable at 314.  Infected sacral decub- no I and D per gen surg   -afebrile  No leukocytosis  - completed abx    Continue to monitor 15.  Sleep disturbance no current c/os  Melatonin started on 12/20 16. Constipation:   17. Hypokalemia. 1/4: 3.7 recheck labs Monday , no diuretic on board   LOS: 27 days A FACE TO [place] 10/30/2019, 7:48 AM

## 2019-10-31 ENCOUNTER — Inpatient Hospital Stay (HOSPITAL_COMMUNITY): Payer: Medicare HMO | Admitting: Physical Therapy

## 2019-10-31 ENCOUNTER — Inpatient Hospital Stay (HOSPITAL_COMMUNITY): Payer: Medicare HMO | Admitting: Speech Pathology

## 2019-10-31 ENCOUNTER — Inpatient Hospital Stay (HOSPITAL_COMMUNITY): Payer: Medicare HMO | Admitting: Occupational Therapy

## 2019-10-31 NOTE — Progress Notes (Signed)
Orthopedic Tech Progress Note Patient Details:  [name] 06-13-1951 [id number]  Ortho Devices Type of Ortho Device: Shoulder immobilizer Ortho Device/Splint Location: LLE Ortho Device/Splint Interventions: Application, Ordered   Post Interventions Patient Tolerated: Well Instructions Provided: Care of device, Adjustment of device   [name] 10/31/2019, 12:05 PM

## 2019-10-31 NOTE — Progress Notes (Signed)
Pt refused several PM medications, educated pt on benefits, pt verbalized understanding and refused. Dressing change to sacral wound at 0600. No signs of distress noted during shift. Pt slept on and off throughout the night.

## 2019-10-31 NOTE — Progress Notes (Signed)
[facility] Progress Note  Subjective:Feels well.  No new issues. Feels arm edema has improved some since [facility] out but still not normal.  Vitals:   10/30/19 1928 10/30/19 2112 10/31/19 0541 10/31/19 0820  BP: 126/77  138/81   Pulse: 100  98 96  Resp: 16  16 18   Temp: 97.6 F (36.4 C)  97.7 F (36.5 C)   TempSrc: Oral  Oral   SpO2: 100% 95% 100% 100%  Height:        Exam: Gen: seen in room, in bed, calm and pleasant  - eating lunch CVS: Pulse regular tachycardia, S1 and S2 normal  Resp: Clear to auscultation bilaterally, no rales/rhonchi  Abd: Soft, obese, nontender  Ext: No lower extremity edema, LUE with 2+ pitting edema in dependent area only -- about 50% better c/w Wednesday  Assessment/ Plan: 1. Acute renal failure/ AKI: appears had CKD 3 at baseline 1.5-2. Had ATN/ AKI from COVID-related septic shock, started CRRT on 08/07/19 and converted to iHD on 08/26/19. Then started making urine so HD was held. Last HD 12/24. Creat peaked at 4.0 on 12.29 and is down to 2.9 today. Remove HD catheter.  Will sign off as it appears he now has CKD which is continuing to improve.  I wouldn't be surprised if his new baseline is near this level but we may see some additional improvement.  Will arrange for follow up in nephrology clinic in 6 weeks with myself.  2. COVID-19 pneumonia with hypoxic respiratory failure and prolonged hospitalization/complications: trach is out and pt doing better, on rehab.  3. Anemia: s/p IV iron load. Receiving aranesp - last dose 1/2.   Hb into high 8s now.  Will likely benefit from outpt ESA, will refer when see him in f/u visit.  4. CKD-MBD: With borderline elevated calcium levels when corrected for albumin, phosphorus normal; PTH 10/07/19 28 5. Nutrition: Continue current renal diet with oral protein supplementation to promote healing of sacral decubitus  6. Hypertension: Euvolemic on physical exam, blood pressures acceptable.  7. Sacral decubitus:  Ongoing aggressive wound management by wound care/surgery 8. LUE Edema: says present for a few weeks. BB AVF created 12/21 and had LIJ TDC.  The [facility] is out and it's improving.  If the LUE edema is not resolved by next week I would recommend to call [facility] for evaluation as it occurred after AVF placed.  It's improved significantly after his [facility] was removed only <48h ago and may continue to improve in which case they can see him for typical post op.  Inpatient medications: . aspirin EC  81 mg Oral Daily  . atorvastatin  40 mg Oral q1800  . budesonide (PULMICORT) nebulizer solution  0.5 mg Nebulization BID  . chlorhexidine  15 mL Mouth/Throat BID  . diclofenac Sodium  2 g Topical QID  . feeding supplement (NEPRO CARB STEADY)  237 mL Oral Q24H  . feeding supplement (PRO-STAT SUGAR FREE 64)  30 mL Oral BID  . heparin  5,000 Units Subcutaneous Q8H  . hydrocerin   Topical TID  . lidocaine  1 patch Transdermal Q24H  . magic mouthwash w/lidocaine  5 mL Oral QID  . multivitamin  1 tablet Oral QHS  . pantoprazole  40 mg Oral Q1200  . polyethylene glycol  17 g Oral BID  . ramelteon  8 mg Oral QHS  . senna-docusate  2 tablet Oral BID  . sucralfate  1 g Oral Q6H   . sodium chloride  10 mL/hr at 10/23/19 1527   sodium chloride, acetaminophen (TYLENOL) oral liquid 160 mg/5 mL, HYDROcodone-acetaminophen, ipratropium-albuterol, Melatonin, polyvinyl alcohol, sorbitol, white petrolatum

## 2019-10-31 NOTE — Progress Notes (Signed)
Physical Therapy Session Note  Patient Details  Name: [name] MRN: [id number] Date of Birth: 1950-11-06  Today's Date: 10/31/2019 PT Individual Time: 1300-1350 PT Individual Time Calculation (min): 50 min   Short Term Goals: Week 4:  PT Short Term Goal 1 (Week 4): pt will transfer to and from [facility] with min assist consistantly with LRAD PT Short Term Goal 2 (Week 4): Pt will ambulate 69f with max assist + 2 for WC follow PT Short Term Goal 3 (Week 4): Pt will propell WC >157fwith supervision assist PT Short Term Goal 4 (Week 4): Pt will tolerate standing up to 1 minute with mod assist Week 5:     Skilled Therapeutic Interventions/Progress Updates:   Pt received supine in bed and reports that he is extremely fatigued with mild stomach, head and shoulder pain, requesting to postpone PT treatment today. PT educated pt in benefit of continued movement and ultimately agreeable to PT. Pt able to don pants at bed level once PT set up pant to knees.  pregait step Supine>sit transfer with mod assist due to L shoulder pain and poor attention to task. PT donned Shoes for pt and LAFO. Orthotist then present for orthotic fit. Sit<>stand from EOB with mod assist to assist .Standing tolerance 2 x 1 min with AFO. Orthotist Recommendation for anterior support with rigid upright for improved knee support. latreal scooting with min assist and moderate cues for sequencing. Sit>supine with min assist for LUE support.  PT taped L shoulder for sublux support with KT tape. Heel cord stretching 4 x 1 min BLE. Pt left supine in bed with call bell in reach and all needs met.        Therapy Documentation Precautions:  Precautions Precautions: Fall Precaution Comments: Sacral decubitus wounds, Lt hemi Restrictions Weight Bearing Restrictions: No   Pain: 8/10 - L shoulder. Pt repositioned and KT tape applied    Therapy/Group: Individual Therapy  AuLorie [name]/05/2020, 1:56 PM

## 2019-10-31 NOTE — Plan of Care (Signed)
  Problem: RH Tub/Shower Transfers Goal: LTG Patient will perform tub/shower transfers w/assist (OT) Description: LTG: Patient will perform tub/shower transfers with assist, with/without cues using equipment (OT) Outcome: Adequate for Discharge Note: Goal d/c due to tunneling wound

## 2019-10-31 NOTE — Progress Notes (Signed)
PHYSICAL MEDICINE & REHABILITATION PROGRESS NOTE   Subjective/Complaints:   Large BM this am , concerned about shoulder sublux.  Discussed with OT who measured 1 FB sublux    ROS: Patient denies CP, SOB, N/V/D  Objective:   IR Removal Tun Cv Cath W/O FL  Result Date: 10/29/2019 INDICATION: Patient with acute renal failure secondary to COVID related septic shock previously requiring dialysis now with improved renal function. Request to IR for removal of tunneled left IJ hemodialysis catheter. EXAM: REMOVAL OF TUNNELED HEMODIALYSIS CATHETER MEDICATIONS: 6 mL 1% lidocaine COMPLICATIONS: None immediate. PROCEDURE: Informed written consent was obtained from the patient following an explanation of the procedure, risks, benefits and alternatives to treatment. A time out was performed prior to the initiation of the procedure. Maximal barrier sterile technique was utilized including caps, mask, sterile gowns, sterile gloves, large sterile drape, hand hygiene, and Hibiclens. 1% lidocaine was injected under sterile conditions along the subcutaneous tunnel. Utilizing a combination of blunt dissection and gentle traction, the catheter was removed intact. Hemostasis was obtained with manual compression. A dressing was placed. The patient tolerated the procedure well without immediate post procedural complication. IMPRESSION: Successful removal of tunneled dialysis catheter. Read by Candiss Norse, PA-C Electronically Signed   By: Jacqulynn Cadet M.D.   On: 10/29/2019 11:02   No results for input(s): WBC, HGB, HCT, PLT in the last 72 hours. Recent Labs    10/29/19 0544  NA 141  K 3.5  CL 107  CO2 23  GLUCOSE 97  BUN 32*  CREATININE 2.86*  CALCIUM 10.2    Intake/Output Summary (Last 24 hours) at 10/31/2019 0744 Last data filed at 10/31/2019 0542 Gross per 24 hour  Intake 800 ml  Output 275 ml  Net 525 ml     Physical Exam: Vital Signs Blood pressure 138/81, pulse 98, temperature  97.7 F (36.5 C), temperature source Oral, resp. rate 16, height 5\' 10"  (1.778 m), weight 81 kg, SpO2 100 %. Constitutional: No distress . Labs and Vital signs reviewed. HEENT: EOMI, oral membranes moist Neck: supple Cardiovascular: tachy 100-110 without murmur. No JVD    Respiratory: CTA Bilaterally without wheezes or rales. Normal effort    GI: BS +, non-tender, non-distended  Skin: Warm and dry.  Intact. Has sacral draining sinus- 6" aquacel pack, no surrounding erythema Psych: extremely pleasant Musc: No edema in extremities.  No tenderness in extremities. Neuro: Alert Dysphonia, unchanged  Motor: RUE/RLE: 5/5 proximal distal LUE: Shoulder abduction, elbow flexion/extension 4-/5, handgrip 2/5 LLE: Hip flexion, knee extension 4-/5, ankle dorsiflexion 0/5--  Assessment/Plan: 1. Functional deficits secondary to Left hemiparesis from RIght parietal infarct   which require 3+ hours per day of interdisciplinary therapy in a comprehensive inpatient rehab setting.  Physiatrist is providing close team supervision and 24 hour management of active medical problems listed below.  Physiatrist and rehab team continue to assess barriers to discharge/monitor patient progress toward functional and medical goals  Care Tool:  Bathing    Body parts bathed by patient: Chest, Abdomen, Front perineal area, Right upper leg, Left upper leg, Face, Left arm   Body parts bathed by helper: Right arm, Buttocks, Right lower leg, Left lower leg     Bathing assist Assist Level: Moderate Assistance - Patient 50 - 74%     Upper Body Dressing/Undressing Upper body dressing   What is the patient wearing?: Pull over shirt    Upper body assist Assist Level: Maximal Assistance - Patient 25 - 49%  Lower Body Dressing/Undressing Lower body dressing      What is the patient wearing?: Pants     Lower body assist Assist for lower body dressing: Total Assistance - Patient < 25%     Toileting Toileting  Toileting Activity did not occur Landscape architect and hygiene only): N/A (no void or bm)  Toileting assist Assist for toileting: Maximal Assistance - Patient 25 - 49% Assistive Device Comment: urinal    Transfers Chair/bed transfer  Transfers assist  Chair/bed transfer activity did not occur: Safety/medical concerns  Chair/bed transfer assist level: Maximal Assistance - Patient 25 - 49%     Locomotion Ambulation   Ambulation assist   Ambulation activity did not occur: Safety/medical concerns  Assist level: 2 helpers Assistive device: Other (comment)(rail in hall) Max distance: 4   Walk 10 feet activity   Assist  Walk 10 feet activity did not occur: Safety/medical concerns        Walk 50 feet activity   Assist Walk 50 feet with 2 turns activity did not occur: Safety/medical concerns         Walk 150 feet activity   Assist Walk 150 feet activity did not occur: Safety/medical concerns         Walk 10 feet on uneven surface  activity   Assist Walk 10 feet on uneven surfaces activity did not occur: Safety/medical concerns         Wheelchair     Assist   Type of Wheelchair: Manual    Wheelchair assist level: Minimal Assistance - Patient > 75% Max wheelchair distance: 100    Wheelchair 50 feet with 2 turns activity    Assist        Assist Level: Minimal Assistance - Patient > 75%   Wheelchair 150 feet activity     Assist      Assist Level: Dependent - Patient 0%   Blood pressure 138/81, pulse 98, temperature 97.7 F (36.5 C), temperature source Oral, resp. rate 16, height 5\' 10"  (1.778 m), weight 81 kg, SpO2 100 %.  Medical Problem List and Plan: 1.  Debility secondary to acute hypoxemic RIght parietal infarct with Left hemiparesis LE>UE   Continue CIR- PT, OT, SLP ELOS 1/15 2.  Antithrombotics: -DVT/anticoagulation: Subcutaneous heparin. Negative  vascular study             -antiplatelet therapy: Aspirin 81 mg  daily 3. Pain Management: Hycet as needed,some sacral pain but not severe    1/7 controlled  4. Mood: Provide emotional support. Rozerem 8 mg nightly             -antipsychotic agents: N/A 5. Neuropsych: This patient is capable of making decisions on his own behalf.          6. Skin/Wound Care: Wound care sacral decubitus with tunneling. , cont mattress overlay, sidelying recommended   -pack Aquacel Ag+ twice daily for WOC     -nutrition, turning, prevalon boots for heels   7. Fluids/Electrolytes/Nutrition: Routine in and outs 8.  Anemia/melena.  Continue Aranesp  Hgb 8.6 on 1/4 9.  Tracheostomy 08/22/2019.  Decannulated.  Diet advanced to regular.  Continue nebulizers as directed 10.  MSSA bacteremia/pneumonia.  Antibiotic therapy completed 11.  Hyperlipidemia.  Lipitor 12.  Post stroke oropharyngeal dysphagia: Appreciate speech therapy recommendations; regular diet with oral care BID  -MMW for thrush 13.  Renal failure after sepsis-resolveing per nephro no need for HD Creat stable at 314.  Infected sacral decub- no I and D  per gen surg   -afebrile  No leukocytosis  - completed abx    Continue to monitor 15.  Sleep disturbance no current c/os  Melatonin started on 12/20 16. Constipation:   17. Hypokalemia. 1/4: 3.7 recheck labs Monday , no diuretic on board   LOS: 28 days A FACE TO [place] E Solomiya Pascale 10/31/2019, 7:44 AM

## 2019-10-31 NOTE — Progress Notes (Signed)
Speech Language Pathology Daily Session Note  Patient Details  Name: [name] MRN: [id number] Date of Birth: 08/04/1951  Today's Date: 10/31/2019 SLP Individual Time: 1100-1127 SLP Individual Time Calculation (min): 27 min, missed 33 minutes d/t stomach issues  Short Term Goals: Week 4: SLP Short Term Goal 1 (Week 4): Patient will complete higher level reasoning and executive functioning tasks with 90% accuracy and supervision cues. SLP Short Term Goal 2 (Week 4): Patient will utilize external aids to recall fucntional informatin in 75% of opportunities with Min A verbal cues. SLP Short Term Goal 3 (Week 4): Patient will perform 25 repetitions of EMST/IMST exercises with overall supervision verbal cues for accuracy with a self-perceived effort level of 8/10. SLP Short Term Goal 4 (Week 4): Patient will utilize speech intelligibility strategies at the sentence level to achieve ~90% intelligibility with Mod I.  Skilled Therapeutic Interventions:  Skilled treatment session targeted ongoing cognitive therapy. PT was very fatigued and with Maximal cues, SLP able to deduce that pt didn't feel well. Pt reports that he has made nursing aware however it appears that he is speaking of OT. With maximal cues, pt unable to describe what is going on. Pt continueds to close his eyes in between questions. SLP consulted with pt's nurse who is unaware of issues, SLP provided Ginger Ale. SLP also spoke with pt's OT regarding morning OT session. She states that pt has very large BM and she endorses stomach issues. Pt missed 33 minutes of skilled ST as a result. Pt's nursing is aware.      Pain    Therapy/Group: Individual Therapy  [name] 10/31/2019, 11:42 AM

## 2019-10-31 NOTE — Progress Notes (Signed)
Social Work Patient ID: [name], male   DOB: 19-Jan-1951, 69 y.o.   MRN: [id number]  Met with pt and wife on Wednesday to review team conference.  Both aware and agreeable with targeted d/c date of 1/15 and mod assist goals overall.  Will plan to begin family ed with wife.  Continue to follow.  Natasa Stigall, LCSW

## 2019-10-31 NOTE — Progress Notes (Signed)
Occupational Therapy Session Note  Patient Details  Name: [name] MRN: [id number] Date of Birth: Mar 13, 1951  Today's Date: 10/31/2019 OT Individual Time: 2248-2500 OT Individual Time Calculation (min): 58 min   Skilled Therapeutic Interventions/Progress Updates:    Pt greeted in bed, reporting urgent need to have a BM. He stated he hasn't had a BM in 4 days and felt "the worst (I've) ever felt." Min A for supine<sit and then OT donned his sneakers including Lt AFO. Max A for sit<stand with vcs for scooting forward and increasing anterior weight shift during power up. Pt with Lt knee instability mid-transfer with RW and lost his balance onto the bed. Max A for squat pivot<drop arm BSC. Pt able to complete squat stand with max A for OT to remove brief. Note that pt had incontinent BM that soiled his sacral dressings. Pt continued voiding while on BSC. He c/o some nausea and held onto a basin, but no vomiting. Pt completed several sit<stands with Max A for facilitating anterior weight, vcs for improving erect posture in standing. Note Lt knee instability during all stands. At this time RN or NT assisted with hygiene, dressing change, and elevating new brief over hips. Pt reported feeling exhausted after toileting, Max A for squat pivot<bed going towards Lt side. Mod A for scooting up towards [place] and for returning to supine. Pt repositioned for comfort with L UE supported. He exhibits increased edema compared to sessions last week with this therapist and also 1 finger width sublux. Pt reported his arm felt like it was "sliding out" throughout session, even when arm was supported on pillow while he sat on BSC. Pt left in bed with all needs within reach and 4 bedrails up. He declined for OT to assist him with obtaining sidelying position for pressure relief. He also declined for OT to ask RN if he could have antinausea medicine, stated he felt better when lying down and just needed to rest.   Therapy  Documentation Precautions:  Precautions Precautions: Fall Precaution Comments: Sacral decubitus wounds, Lt hemi Restrictions Weight Bearing Restrictions: No Vital Signs: Therapy Vitals Temp: 97.7 F (36.5 C) Temp Source: Oral Pulse Rate: 98 Resp: 16 BP: 138/81 Patient Position (if appropriate): Lying Oxygen Therapy SpO2: 100 % O2 Device: Room Air Pain: in Lt shoulder, repositioned/supported affected UE during session and also before departure    ADL: ADL Eating: Maximal assistance(for sitting balance) Where Assessed-Eating: Edge of bed Grooming: Maximal assistance Where Assessed-Grooming: Edge of bed Upper Body Bathing: Maximal assistance Where Assessed-Upper Body Bathing: Edge of bed Lower Body Bathing: Dependent(+2 assist) Where Assessed-Lower Body Bathing: Bed level Upper Body Dressing: Dependent Where Assessed-Upper Body Dressing: Edge of bed Lower Body Dressing: Dependent(+2 assist) Where Assessed-Lower Body Dressing: Bed level Toileting: Not assessed Toilet Transfer: Not assessed Tub/Shower Transfer: Not assessed     Therapy/Group: Individual Therapy  Carlyle Achenbach A Kamora Vossler 10/31/2019, 8:56 AM

## 2019-11-01 ENCOUNTER — Inpatient Hospital Stay (HOSPITAL_COMMUNITY): Payer: Medicare HMO | Admitting: Physical Therapy

## 2019-11-01 NOTE — Progress Notes (Signed)
  Patient ID: [name], male   DOB: [birth year], 69 y.o.   MRN: [id number]   Diagnosis codes:  U07.1; I63.9;  R53.81; N18.6; L89.159  Height:    5'10"            Weight:     163 obs       Patient suffers from post Covid-19 debility, bilateral CVAs and ESRD as well as a stage 3 sacral wound  which impairs his ability to perform daily activities like bathing, dressing and mobility in the home.  A walker will not resolve issue with performing activities of daily living.  A wheelchair will allow patient to safely perform daily activities.  Patient is not able to propel themselves in the home using a standard weight wheelchair due to significant debility.  Patient can self propel in the lightweight wheelchair.   Lauraine Rinne, PA-C

## 2019-11-01 NOTE — Progress Notes (Signed)
Physical Therapy Weekly Progress Note  Patient Details  Name: [name] MRN: [id number] Date of Birth: 1951/09/28  Beginning of progress report period: October 25, 2019 End of progress report period: November 01, 2019  Today's Date: 11/01/2019 PT Individual Time: 1000-1110 PT Individual Time Calculation (min): 70 min   Patient has met 4 of 4 short term goals.  Pt is making steady progress towards LTG of min assist. Poor processing, sequencing, and awareness as well as pain in the L shoulder with subluxation has limited further progress at this time, but but able to perform SB transfers with min-mod assist, stedy transfers with min assist, WC mobility with superivison assist, and has initiated some gait training with RW and LAFO.  Patient continues to demonstrate the following deficits muscle weakness, muscle joint tightness and muscle paralysis, decreased cardiorespiratoy endurance, impaired timing and sequencing, abnormal tone, unbalanced muscle activation, motor apraxia, ataxia, decreased coordination and decreased motor planning, decreased attention to left and decreased motor planning, decreased attention, decreased awareness, decreased problem solving, decreased safety awareness, decreased memory and delayed processing and decreased sitting balance, decreased standing balance, decreased postural control, hemiplegia and decreased balance strategies and therefore will continue to benefit from skilled PT intervention to increase functional independence with mobility.  Patient progressing toward long term goals..  Continue plan of care.  PT Short Term Goals Week 4:  PT Short Term Goal 1 (Week 4): pt will transfer to and from [facility] with min assist consistantly with LRAD PT Short Term Goal 1 - Progress (Week 4): Met PT Short Term Goal 2 (Week 4): Pt will ambulate 52f with max assist + 2 for WC follow PT Short Term Goal 2 - Progress (Week 4): Met PT Short Term Goal 3 (Week 4): Pt will propell WC  >154fwith supervision assist PT Short Term Goal 3 - Progress (Week 4): Met PT Short Term Goal 4 (Week 4): Pt will tolerate standing up to 1 minute with mod assist PT Short Term Goal 4 - Progress (Week 4): Met Week 5:  PT Short Term Goal 1 (Week 5): STG=LTG due to ELOS  Skilled Therapeutic Interventions/Progress Updates:   Pt received supine in bed and agreeable to PT. Supine>sit transfer with min assist at L shoulder to provide appoximation force and reduce pain. PT then applied envelop sling to LUE for Pain management.  [facility] from elevated height at 1 UE support.   WC mobility x 15086fith hemi technique and supervision assist with min cues for improved turn technique to the R and safety with turns to the L to avoid obstcles.   Sit<>stand form WC x 3 with mod assist for LUE placement to push from WC [facility]  Anterior weight shifting. Pt perfomed 2 bouts of gait training x 4ft7fch with RW with mod-max assist for LLE blocking and improved coordination to prevent scissoring as well as + 2 for safety and WC follow.   Stand pivot transfer to mat table with RW and max assist and max multimodal cues for sequencing and improved postural control to prevent L LOB. Sit<>stand transfer training from EOBx4  with mod assist for set up and improved WB through LLE  Squat pivot transfer to [facility] mod assist max cues for improved anterior weight shift and sequencing to improve pelvic rotation.   Sb transfer to R and L with mod assist up hill and min assist down to WC [facility]Iron[facility] Inch min cues for set up and improved  sequencing of movement to prevent LOB. Pt reports need for BM.   Transported back to room and performed steady transfer to and from toilet with min-mod assist. PT performed pericare while pt standing in stedy. Pt performed stedy transfer to bed with min assist. Sit>supine completed with min assist for LLE management, and left supine in bed with call bell in reach and all  needs met.          Therapy Documentation Precautions:  Precautions Precautions: Fall Precaution Comments: Sacral decubitus wounds, Lt hemi Restrictions Weight Bearing Restrictions: No Pain: Pain Assessment Pain Scale: 0-10 Pain Score: 0-No pain   Therapy/Group: Individual Therapy  [name] [name] 11/01/2019, 12:39 PM

## 2019-11-01 NOTE — Plan of Care (Signed)
  Problem: Consults Goal: RH STROKE PATIENT EDUCATION Description: See Patient Education module for education specifics  Outcome: Progressing   Problem: RH BOWEL ELIMINATION Goal: RH STG MANAGE BOWEL WITH ASSISTANCE Description: STG Manage Bowel with min/mod Assistance. Outcome: Progressing   Problem: RH BLADDER ELIMINATION Goal: RH STG MANAGE BLADDER WITH ASSISTANCE Description: STG Manage Bladder With min/mod Assistance Outcome: Progressing   Problem: RH SKIN INTEGRITY Goal: RH STG SKIN FREE OF INFECTION/BREAKDOWN Description: Patients skin will remain free from further infection or breakdown with mod assist. Outcome: Progressing Goal: RH STG ABLE TO PERFORM INCISION/WOUND CARE W/ASSISTANCE Description: STG Able To Perform Incision/Wound Care With total Assistance from caregiver. Outcome: Progressing

## 2019-11-01 NOTE — Progress Notes (Signed)
Orthopedic Tech Progress Note Patient Details:  [name] August 22, 1951 [id number]  Ortho Devices Type of Ortho Device: Shoulder immobilizer Ortho Device/Splint Location: left Prafo boot Ortho Device/Splint Interventions: Application   Post Interventions Patient Tolerated: Well Instructions Provided: Care of device   [name] 11/01/2019, 4:19 PM

## 2019-11-01 NOTE — Progress Notes (Addendum)
Pt slept on and off during night. Pt c/o headache 4 of 10 at 0600 but denied prn pain med. Assisted pt to changed clothes, Did dressing change to sacrum per order. Moderate Purulent drainage noted.  Pt refused several PM medications, educated pt on benefits of medications, pt refused.

## 2019-11-02 ENCOUNTER — Inpatient Hospital Stay (HOSPITAL_COMMUNITY): Payer: Medicare HMO | Admitting: Occupational Therapy

## 2019-11-02 NOTE — Plan of Care (Signed)
  Problem: Consults Goal: RH STROKE PATIENT EDUCATION Description: See Patient Education module for education specifics  Outcome: Progressing   Problem: RH BOWEL ELIMINATION Goal: RH STG MANAGE BOWEL WITH ASSISTANCE Description: STG Manage Bowel with min/mod Assistance. Outcome: Progressing   Problem: RH BLADDER ELIMINATION Goal: RH STG MANAGE BLADDER WITH ASSISTANCE Description: STG Manage Bladder With min/mod Assistance Outcome: Progressing   Problem: RH SKIN INTEGRITY Goal: RH STG SKIN FREE OF INFECTION/BREAKDOWN Description: Patients skin will remain free from further infection or breakdown with mod assist. Outcome: Progressing Goal: RH STG ABLE TO PERFORM INCISION/WOUND CARE W/ASSISTANCE Description: STG Able To Perform Incision/Wound Care With total Assistance from caregiver. Outcome: Progressing

## 2019-11-02 NOTE — Progress Notes (Signed)
[place] PHYSICAL MEDICINE & REHABILITATION PROGRESS NOTE   Subjective/Complaints:   Left arm swelling, we discussed his AV graft surgery.  Mild pain in the left arm.  No erythema no tenderness   ROS: Patient denies CP, SOB, N/V/D  Objective:   No results found. No results for input(s): WBC, HGB, HCT, PLT in the last 72 hours. No results for input(s): NA, K, CL, CO2, GLUCOSE, BUN, CREATININE, CALCIUM in the last 72 hours.  Intake/Output Summary (Last 24 hours) at 11/02/2019 1438 Last data filed at 11/02/2019 1430 Gross per 24 hour  Intake 812 ml  Output 400 ml  Net 412 ml     Physical Exam: Vital Signs Blood pressure 138/77, pulse 94, temperature 98.7 F (37.1 C), resp. rate 16, height 5\' 10"  (1.778 m), weight 81 kg, SpO2 97 %. Constitutional: No distress . Labs and Vital signs reviewed. HEENT: EOMI, oral membranes moist Neck: supple Cardiovascular: tachy 100-110 without murmur. No JVD    Respiratory: CTA Bilaterally without wheezes or rales. Normal effort    GI: BS +, non-tender, non-distended  Skin: Warm and dry.  Intact. Has sacral draining sinus- 6" aquacel pack, no surrounding erythema Left upper extremity sutures intact no drainage.  No erythema Psych: extremely pleasant Musc: No edema in extremities.  No tenderness in extremities. Neuro: Alert Dysphonia, unchanged  Motor: RUE/RLE: 5/5 proximal distal LUE: Shoulder abduction, elbow flexion/extension 4-/5, handgrip 2/5 LLE: Hip flexion, knee extension 4-/5, ankle dorsiflexion 0/5-- Extremities: Left arm edema mainly above the elbow. Assessment/Plan: 1. Functional deficits secondary to Left hemiparesis from RIght parietal infarct   which require 3+ hours per day of interdisciplinary therapy in a comprehensive inpatient rehab setting.  Physiatrist is providing close team supervision and 24 hour management of active medical problems listed below.  Physiatrist and rehab team continue to assess barriers to  discharge/monitor patient progress toward functional and medical goals  Care Tool:  Bathing    Body parts bathed by patient: Chest, Abdomen, Front perineal area, Right upper leg, Left upper leg, Face, Left arm   Body parts bathed by helper: Right arm, Buttocks, Right lower leg, Left lower leg     Bathing assist Assist Level: Moderate Assistance - Patient 50 - 74%     Upper Body Dressing/Undressing Upper body dressing   What is the patient wearing?: Pull over shirt    Upper body assist Assist Level: Maximal Assistance - Patient 25 - 49%    Lower Body Dressing/Undressing Lower body dressing      What is the patient wearing?: Pants     Lower body assist Assist for lower body dressing: Total Assistance - Patient < 25%     Toileting Toileting Toileting Activity did not occur Landscape architect and hygiene only): N/A (no void or bm)  Toileting assist Assist for toileting: Maximal Assistance - Patient 25 - 49% Assistive Device Comment: urinal    Transfers Chair/bed transfer  Transfers assist  Chair/bed transfer activity did not occur: Safety/medical concerns  Chair/bed transfer assist level: Minimal Assistance - Patient > 75%     Locomotion Ambulation   Ambulation assist   Ambulation activity did not occur: Safety/medical concerns  Assist level: 2 helpers Assistive device: Walker-rolling Max distance: 4   Walk 10 feet activity   Assist  Walk 10 feet activity did not occur: Safety/medical concerns        Walk 50 feet activity   Assist Walk 50 feet with 2 turns activity did not occur: Safety/medical concerns  Walk 150 feet activity   Assist Walk 150 feet activity did not occur: Safety/medical concerns         Walk 10 feet on uneven surface  activity   Assist Walk 10 feet on uneven surfaces activity did not occur: Safety/medical concerns         Wheelchair     Assist   Type of Wheelchair: Manual    Wheelchair assist  level: Supervision/Verbal cueing Max wheelchair distance: 100    Wheelchair 50 feet with 2 turns activity    Assist        Assist Level: Supervision/Verbal cueing   Wheelchair 150 feet activity     Assist      Assist Level: Supervision/Verbal cueing   Blood pressure 138/77, pulse 94, temperature 98.7 F (37.1 C), resp. rate 16, height 5\' 10"  (1.778 m), weight 81 kg, SpO2 97 %.  Medical Problem List and Plan: 1.  Debility secondary to acute hypoxemic RIght parietal infarct with Left hemiparesis LE>UE   Continue CIR- PT, OT, SLP ELOS 1/15 2.  Antithrombotics: -DVT/anticoagulation: Subcutaneous heparin. Negative  vascular study             -antiplatelet therapy: Aspirin 81 mg daily 3. Pain Management: Hycet as needed,some sacral pain but not severe    1/7 controlled  4. Mood: Provide emotional support. Rozerem 8 mg nightly             -antipsychotic agents: N/A 5. Neuropsych: This patient is capable of making decisions on his own behalf.          6. Skin/Wound Care: Wound care sacral decubitus with tunneling. , cont mattress overlay, sidelying recommended   -pack Aquacel Ag+ twice daily for WOC     -nutrition, turning, prevalon boots for heels   7. Fluids/Electrolytes/Nutrition: Routine in and outs 8.  Anemia/melena.  Continue Aranesp  Hgb 8.6 on 1/4 9.  Tracheostomy 08/22/2019.  Decannulated.  Diet advanced to regular.  Continue nebulizers as directed 10.  MSSA bacteremia/pneumonia.  Antibiotic therapy completed 11.  Hyperlipidemia.  Lipitor 12.  Post stroke oropharyngeal dysphagia: Appreciate speech therapy recommendations; regular diet with oral care BID  -MMW for thrush 13.  Renal failure after sepsis-resolveing per nephro no need for HD Creat stable at 314.  Infected sacral decub- no I and D per gen surg   -afebrile  No leukocytosis  - completed abx    Continue to monitor 15.  Sleep disturbance no current c/os  Melatonin started on 12/20 16.  Constipation:   17. Hypokalemia. 1/4: 3.7 recheck labs Monday , no diuretic on board  #18.  AV graft placed on the left side in the upper extremity when long-term hemodialysis appeared imminent.  Fortunately renal status has subsequently improved LOS: 30 days A FACE TO [place] E Fateh Kindle 11/02/2019, 2:38 PM

## 2019-11-02 NOTE — Progress Notes (Signed)
Patient refusing all mouthwashes and medicated creams this weekend. MD Kirsteins notified.

## 2019-11-02 NOTE — Progress Notes (Signed)
Occupational Therapy Session Note  Patient Details  Name: [name] MRN: [id number] Date of Birth: [birth year]  Today's Date: 11/02/2019 OT Individual Time: 8786-7672 OT Individual Time Calculation (min): 58 min   Skilled Therapeutic Interventions/Progress Updates:    Pt greeted in bed, tired but agreeable to try to use the [facility]. Started with donning pants bedlevel using hemi techniques. Pt needing Max A to thread LEs into pants and also assist to stabilize Lt foot when bridging. Pt also needed assist for full elevation of pants via rolling. Min A for supine<sit and while he sat EOB OT donned shoes including Lt AFO component. Slideboard<drop arm BSC completed with Max A going towards Lt side, max multimodal cues for head hip relationships and anterior weight shifting. Pt tended to lean back and try to scoot, but would often just scoot himself towards Rt side (away from [facility]). Mod A for sit<stand using RW with manual facilitation for anterior weight shifting. OT lowered pants but he was unable to stand long enough for doffing brief also. Due to fatigue, brief was unfastened and doffed via small squat stands and leans. Pt had continent B+B void while sitting. Continued working on sit<stands and standing endurance during hygiene and clothing mgt. Pt able to stand for 10 second windows or less, needing prolonged rest breaks in between stands. He required cuing for hand placement during all stands and assist to place L UE into RW orthosis. Pt then reported feeling exhausted and requested to return to bed. Mod A to start him off with slideboard transfer, and then pt scooted with Min A back to bed. Once shoes were removed pt returned to supine position with Mod A. He boosted himself up with use of headboard with bed placed in trendelenburg position. Repositioned pt to protect hemiplegic side, strongly encouraged self ROM throughout the day to help manage L UE edema. Pt left in bed with all needs within reach and  4 bedrails up.    Therapy Documentation Precautions:  Precautions Precautions: Fall Precaution Comments: Sacral decubitus wounds, Lt hemi Restrictions Weight Bearing Restrictions: No Vital Signs: Therapy Vitals Temp: 99.1 F (37.3 C) Temp Source: Oral Pulse Rate: (!) 103 Resp: 20 BP: 128/80 Patient Position (if appropriate): Lying Oxygen Therapy SpO2: 100 % O2 Device: Room Air Pain: in Lt shoulder. Limb was supported throughout tx via positioning strategies   ADL: ADL Eating: Maximal assistance(for sitting balance) Where Assessed-Eating: Edge of bed Grooming: Maximal assistance Where Assessed-Grooming: Edge of bed Upper Body Bathing: Maximal assistance Where Assessed-Upper Body Bathing: Edge of bed Lower Body Bathing: Dependent(+2 assist) Where Assessed-Lower Body Bathing: Bed level Upper Body Dressing: Dependent Where Assessed-Upper Body Dressing: Edge of bed Lower Body Dressing: Dependent(+2 assist) Where Assessed-Lower Body Dressing: Bed level Toileting: Not assessed Toilet Transfer: Not assessed Tub/Shower Transfer: Not assessed      Therapy/Group: Individual Therapy  Derrall Hicks A [name] Okray 11/02/2019, 4:13 PM

## 2019-11-03 ENCOUNTER — Inpatient Hospital Stay (HOSPITAL_COMMUNITY): Payer: Medicare HMO | Admitting: Occupational Therapy

## 2019-11-03 ENCOUNTER — Inpatient Hospital Stay (HOSPITAL_COMMUNITY): Payer: Medicare HMO | Admitting: Physical Therapy

## 2019-11-03 LAB — BASIC METABOLIC PANEL
Anion gap: 8 (ref 5–15)
BUN: 24 mg/dL — ABNORMAL HIGH (ref 8–23)
CO2: 26 mmol/L (ref 22–32)
Calcium: 9.8 mg/dL (ref 8.9–10.3)
Chloride: 110 mmol/L (ref 98–111)
Creatinine, Ser: 2.98 mg/dL — ABNORMAL HIGH (ref 0.61–1.24)
GFR calc Af Amer: 24 mL/min — ABNORMAL LOW (ref >60)
GFR calc non Af Amer: 21 mL/min — ABNORMAL LOW (ref >60)
Glucose, Bld: 90 mg/dL (ref 70–99)
Potassium: 3.1 mmol/L — ABNORMAL LOW (ref 3.5–5.1)
Sodium: 144 mmol/L (ref 135–145)

## 2019-11-03 LAB — CBC WITH DIFFERENTIAL/PLATELET
Abs Immature Granulocytes: 0.01 10*3/uL (ref 0.00–0.07)
Basophils Absolute: 0 10*3/uL (ref 0.0–0.1)
Basophils Relative: 0 %
Eosinophils Absolute: 0.3 10*3/uL (ref 0.0–0.5)
Eosinophils Relative: 7 %
HCT: 29.6 % — ABNORMAL LOW (ref 39.0–52.0)
Hemoglobin: 8.8 g/dL — ABNORMAL LOW (ref 13.0–17.0)
Immature Granulocytes: 0 %
Lymphocytes Relative: 27 %
Lymphs Abs: 1.2 10*3/uL (ref 0.7–4.0)
MCH: 28.2 pg (ref 26.0–34.0)
MCHC: 29.7 g/dL — ABNORMAL LOW (ref 30.0–36.0)
MCV: 94.9 fL (ref 80.0–100.0)
Monocytes Absolute: 0.5 10*3/uL (ref 0.1–1.0)
Monocytes Relative: 11 %
Neutro Abs: 2.4 10*3/uL (ref 1.7–7.7)
Neutrophils Relative %: 55 %
Platelets: 278 10*3/uL (ref 150–400)
RBC: 3.12 MIL/uL — ABNORMAL LOW (ref 4.22–5.81)
RDW: 17.6 % — ABNORMAL HIGH (ref 11.5–15.5)
WBC: 4.4 10*3/uL (ref 4.0–10.5)
nRBC: 0 % (ref 0.0–0.2)

## 2019-11-03 MED ORDER — ADULT MULTIVITAMIN W/MINERALS CH
1.0000 | ORAL_TABLET | Freq: Every day | ORAL | Status: DC
  Administered 2019-11-03 – 2019-11-06 (×4): 1 via ORAL
  Filled 2019-11-03 (×5): qty 1

## 2019-11-03 MED ORDER — POTASSIUM CHLORIDE 20 MEQ PO PACK
40.0000 meq | PACK | Freq: Two times a day (BID) | ORAL | Status: AC
  Administered 2019-11-03: 40 meq via ORAL
  Filled 2019-11-03 (×2): qty 2

## 2019-11-03 NOTE — Progress Notes (Signed)
  Patient ID: [name], male   DOB: [birth year], 69 y.o.   MRN: [id number]   Diagnosis code:  U07.1;  I63.9;  R53.81;  L89.159  Height:  5'10"  Weight:  163 lbs   Patient has severe debility following COVID infection with septic shock, CVA and stage 3 pressure wound on sacrum which requires lower extremities to be positioned in ways not feasible with a normal bed.  Upper and lower body require frequent and immediate changes in body position which cannot be achieved with a normal bed.   Lauraine Rinne, PA-C

## 2019-11-03 NOTE — Progress Notes (Addendum)
Nutrition Follow-up  DOCUMENTATION CODES:   Not applicable  INTERVENTION:   -D/C Nepro Shake  -ContinuePro-stat30 mlBID, each supplement provides 100 kcals and 15 grams protein -Transition to MVI daily  -Encourage adequate PO intake  NUTRITION DIAGNOSIS:   Increased nutrient needs related to acute illness, wound healing as evidenced by estimated needs.  Ongoing  GOAL:   Patient will meet greater than or equal to 90% of their needs  Progressing  MONITOR:   PO intake, Supplement acceptance, Labs, Weight trends, Skin  REASON FOR ASSESSMENT:   Consult Wound healing  ASSESSMENT:   69 yo male admitted to CIR on 12/11 after long hospitalization due to respiratory failure from COVID 19 pna, AKI requiring CRRT and ultimately iHD. PMH include COVID 19 infection, heart burn   1/6- s/p TDC removal   Spoke with pt at bedside. Excited for discharge on Friday. Reports appetite remains stable. Meal completions charted as 50-100% for his last eight meals. Taking Prostat but does not like Nepro. Discussed need for continued protein intake post discharge. Pt has a hard time finding supplements that do not make him gassy. Went over high protein foods that will promote wound healing.   Admission weight: 85 kg  Current weight: 81 kg   I/O: +1,366 ml since 12/28 UOP: 300 ml x 24 hrs   Medications: miralax, 40 mEq KCl BID, senokot Labs: K 3.1 (L) Cr 2.98- stable Phosphorus 4.7 (H)  Diet Order:   Diet Order            Diet regular Room service appropriate? Yes; Fluid consistency: Thin  Diet effective now              EDUCATION NEEDS:   Not appropriate for education at this time  Skin:  Skin Assessment: Skin Integrity Issues: Skin Integrity Issues Stage III: sacrum (per WOC note)  Last BM:  1/10  Height:   Ht Readings from Last 1 Encounters:  10/15/19 5\' 10"  (1.778 m)    Weight:   Wt Readings from Last 1 Encounters:  10/02/19 74.1 kg    Ideal Body Weight:   75.5 kg  BMI:  Body mass index is 25.62 kg/m.  Estimated Nutritional Needs:   Kcal:  2200-2400 kcal  Protein:  115-140 grams  Fluid:  >/= 2.2 L/day   [name] RD, LDN Clinical Nutrition Pager # - [phone]

## 2019-11-03 NOTE — Progress Notes (Signed)
Pt slept throughout night without issue.will change dressing this am

## 2019-11-03 NOTE — Progress Notes (Signed)
Physical Therapy Session Note  Patient Details  Name: [name] MRN: [id number] Date of Birth: September 23, 1951  Today's Date: 11/03/2019 PT Individual Time: 1003-1100 PT Individual Time Calculation (min): 57 min   Short Term Goals: Week 5:  PT Short Term Goal 1 (Week 5): STG=LTG due to ELOS  Skilled Therapeutic Interventions/Progress Updates: Pt presented in w/c with wife and son present agreeable to therapy. Session focused on family edu in regards to transfers, w/c mobility, and home set up. Pt propelled w/c to day room with supervsion with min verbal cues to avoid objects on L. Provided edu to family regarding L inattention and how that presents during functional activities. In day room discussed use of SB vs RW for transfers and pending levels of fatigue how one may be safer than the other. Explained that currently we do not recommend ambulation except with therapy only due to LLE weakness and ataxia. Wife and son verbalized understanding. PTA then explained w/c set up to wife and son (brakes, leg rests). Pt then set up for level SB transfer to R. PTA provided edu on head/hips relationship and cues that have benefited pt (SB placement, [place] positioning). Pt was able to perform SB transfer with minA to initiate fading to [place]. PTA and family then discussed bed set up at home. Per wife bed 33in (high/low mat only goes to 28") Expressed that may be best practice to remove box spring to drop bed 4-6 inches. Wife verbalized understanding. PTA then demonstrated positioning for STS/Stand pivot transfer. Performed STS from 25in to simulate stand from bed without box spring with pt able to perform with minA. Provided facilitation for increased wt bearing to LLE.Performed second STS with pt then performing stand pivot transfer to R. Pt required max cues for LLE placement and to wt bear on LLE. Pt noted to have L knee instability during transfer but was able to extend L knee with max cues. Pt completed transfer  with maxA. Pt then indicated need for BM. Pt transported back to room and performed STS into [place] with son guarding pt's L side. Pt transferred to toilet minA (+BM).  Pt then performed stand from toilet minA into [place] and maintained standing >1 min while PTA performed peri-care and LB clothing management. Pt transferred back to bed and performed sit to supine at EOB with modA. Pt repositioned to comfort and left with call bell within reach and needs met with family present.       Therapy Documentation Precautions:  Precautions Precautions: Fall Precaution Comments: Sacral decubitus wounds, Lt hemi Restrictions Weight Bearing Restrictions: No General:   Vital Signs:   Pain:   Mobility:   Locomotion :    Trunk/Postural Assessment :    Balance:   Exercises:   Other Treatments:      Therapy/Group: Individual Therapy  Marializ Ferrebee 11/03/2019, 3:39 PM

## 2019-11-03 NOTE — Progress Notes (Signed)
[place] PHYSICAL MEDICINE & REHABILITATION PROGRESS NOTE   Subjective/Complaints: No complaints this morning. Says he has been standing and walking with therapy. Shoulder and knee pain are much better controlled. Has a good appetite and moving bowels regularly.   ROS: Patient denies CP, SOB, N/V/D  Objective:   No results found. Recent Labs    11/03/19 0655  WBC 4.4  HGB 8.8*  HCT 29.6*  PLT 278   Recent Labs    11/03/19 0655  NA 144  K 3.1*  CL 110  CO2 26  GLUCOSE 90  BUN 24*  CREATININE 2.98*  CALCIUM 9.8    Intake/Output Summary (Last 24 hours) at 11/03/2019 0920 Last data filed at 11/03/2019 0900 Gross per 24 hour  Intake 539 ml  Output 300 ml  Net 239 ml     Physical Exam: Vital Signs Blood pressure 139/75, pulse 96, temperature 98.9 F (37.2 C), temperature source Oral, resp. rate 16, height 5\' 10"  (1.778 m), weight 81 kg, SpO2 99 %. Constitutional: No distress . Labs and Vital signs reviewed. HEENT: EOMI, oral membranes moist Neck: supple Cardiovascular: tachy 100-110 without murmur. No JVD    Respiratory: CTA Bilaterally without wheezes or rales. Normal effort    GI: BS +, non-tender, non-distended  Skin: Warm and dry.  Intact. Has sacral draining sinus- 6" aquacel pack, no surrounding erythema Left upper extremity sutures intact no drainage.  No erythema Psych: extremely pleasant Musc: No edema in extremities.  No tenderness in extremities. Neuro: Alert Dysphonia, unchanged  Motor: RUE/RLE: 5/5 proximal distal LUE: Shoulder abduction, elbow flexion/extension 4-/5, handgrip 2/5 LLE: Hip flexion, knee extension 4-/5, ankle dorsiflexion 0/5-- Extremities: Left arm edema mainly above the elbow.  Assessment/Plan: 1. Functional deficits secondary to Left hemiparesis from RIght parietal infarct   which require 3+ hours per day of interdisciplinary therapy in a comprehensive inpatient rehab setting.  Physiatrist is providing close team  supervision and 24 hour management of active medical problems listed below.  Physiatrist and rehab team continue to assess barriers to discharge/monitor patient progress toward functional and medical goals  Care Tool:  Bathing    Body parts bathed by patient: Chest, Abdomen, Front perineal area, Right upper leg, Left upper leg, Face, Left arm   Body parts bathed by helper: Right arm, Buttocks, Right lower leg, Left lower leg     Bathing assist Assist Level: Moderate Assistance - Patient 50 - 74%     Upper Body Dressing/Undressing Upper body dressing   What is the patient wearing?: Pull over shirt    Upper body assist Assist Level: Maximal Assistance - Patient 25 - 49%    Lower Body Dressing/Undressing Lower body dressing      What is the patient wearing?: Pants     Lower body assist Assist for lower body dressing: Total Assistance - Patient < 25%     Toileting Toileting Toileting Activity did not occur Landscape architect and hygiene only): N/A (no void or bm)  Toileting assist Assist for toileting: Maximal Assistance - Patient 25 - 49% Assistive Device Comment: urinal    Transfers Chair/bed transfer  Transfers assist  Chair/bed transfer activity did not occur: Safety/medical concerns  Chair/bed transfer assist level: Minimal Assistance - Patient > 75%     Locomotion Ambulation   Ambulation assist   Ambulation activity did not occur: Safety/medical concerns  Assist level: 2 helpers Assistive device: Walker-rolling Max distance: 4   Walk 10 feet activity   Assist  Walk 10 feet  activity did not occur: Safety/medical concerns        Walk 50 feet activity   Assist Walk 50 feet with 2 turns activity did not occur: Safety/medical concerns         Walk 150 feet activity   Assist Walk 150 feet activity did not occur: Safety/medical concerns         Walk 10 feet on uneven surface  activity   Assist Walk 10 feet on uneven surfaces  activity did not occur: Safety/medical concerns         Wheelchair     Assist   Type of Wheelchair: Manual    Wheelchair assist level: Supervision/Verbal cueing Max wheelchair distance: 100    Wheelchair 50 feet with 2 turns activity    Assist        Assist Level: Supervision/Verbal cueing   Wheelchair 150 feet activity     Assist      Assist Level: Supervision/Verbal cueing   Blood pressure 139/75, pulse 96, temperature 98.9 F (37.2 C), temperature source Oral, resp. rate 16, height 5\' 10"  (1.778 m), weight 81 kg, SpO2 99 %.  Medical Problem List and Plan: 1.  Debility secondary to acute hypoxemic RIght parietal infarct with Left hemiparesis LE>UE   Continue CIR- PT, OT, SLP ELOS 1/15 2.  Antithrombotics: -DVT/anticoagulation: Subcutaneous heparin. Negative  vascular study             -antiplatelet therapy: Aspirin 81 mg daily 3. Pain Management: Hycet as needed,some sacral pain but not severe  1/7, 1/11: controlled  4. Mood: Provide emotional support. Rozerem 8 mg nightly             -antipsychotic agents: N/A 5. Neuropsych: This patient is capable of making decisions on his own behalf.          6. Skin/Wound Care: Wound care sacral decubitus with tunneling. , cont mattress overlay, sidelying recommended   -pack Aquacel Ag+ twice daily for WOC   -nutrition, turning, prevalon boots for heels   7. Fluids/Electrolytes/Nutrition: Routine in and outs 8.  Anemia/melena.  Continue Aranesp  Hgb 8.6 on 1/4, 8/8 on 1/11. 9.  Tracheostomy 08/22/2019.  Decannulated.  Diet advanced to regular.  Continue nebulizers as directed 10.  MSSA bacteremia/pneumonia.  Antibiotic therapy completed 11.  Hyperlipidemia.  Lipitor 12.  Post stroke oropharyngeal dysphagia: Appreciate speech therapy recommendations; regular diet with oral care BID  -MMW for thrush 13.  Renal failure after sepsis-resolveing per nephro no need for HD Creat stable at 314.  Infected sacral decub-  no I and D per gen surg   -afebrile  No leukocytosis  - completed abx    Continue to monitor 15.  Sleep disturbance no current c/os  Melatonin started on 12/20 16. Constipation: 17. Hypokalemia. 1/4: 3.7 recheck labs Monday , no diuretic on board  1/11: K+ is 3.1. Will give 2 doses 32meq Klor today and repeat BMP tomorrow morning.  #18.  AV graft placed on the left side in the upper extremity when long-term hemodialysis appeared imminent.  Fortunately renal status has subsequently improved LOS: 31 days A FACE TO FACE EVALUATION WAS PERFORMED  Shantea Poulton P Barry Faircloth 11/03/2019, 9:20 AM

## 2019-11-03 NOTE — Progress Notes (Signed)
Occupational Therapy Session Note  Patient Details  Name: [name] MRN: [id number] Date of Birth: 1951-09-04  Today's Date: 11/03/2019 OT Individual Time: 1346-1410 OT Individual Time Calculation (min): 24 min    Short Term Goals: Week 4:  OT Short Term Goal 1 (Week 4): STGs=LTGs secondary to upcoming discharge  Skilled Therapeutic Interventions/Progress Updates:    Upon entering the room, pt sleeping soundly but easily awoken for OT intervention. OT measuring wheelchair with it being 26 inches and then looking at home measurement sheet to continue discussion on needs for equipment. Pt's bathroom and bedroom currently are 24 inches in width and he will not be able to get into them with wheelchair. Pt will not be able to physically ambulate into doorway safely at discharge. Pt showing limited insight by asking if he would be able to use crutches at home to get into these spaces. OT continues to provide education and recommendation for need of hospital bed and drop arm commode chair at home. Pt picking up phone to call his wife to tell her "the news". OT offered to provide this education to wife via phone as well but pt declined. Social worker updated with equipment needs and call bell within reach upon exiting the room.   Therapy Documentation Precautions:  Precautions Precautions: Fall Precaution Comments: Sacral decubitus wounds, Lt hemi Restrictions Weight Bearing Restrictions: No    ADL: ADL Eating: Maximal assistance(for sitting balance) Where Assessed-Eating: Edge of bed Grooming: Maximal assistance Where Assessed-Grooming: Edge of bed Upper Body Bathing: Maximal assistance Where Assessed-Upper Body Bathing: Edge of bed Lower Body Bathing: Dependent(+2 assist) Where Assessed-Lower Body Bathing: Bed level Upper Body Dressing: Dependent Where Assessed-Upper Body Dressing: Edge of bed Lower Body Dressing: Dependent(+2 assist) Where Assessed-Lower Body Dressing: Bed  level Toileting: Not assessed Toilet Transfer: Not assessed Tub/Shower Transfer: Not assessed   Therapy/Group: Individual Therapy  Gypsy Decant 11/03/2019, 2:07 PM

## 2019-11-03 NOTE — Progress Notes (Signed)
Occupational Therapy Session Note  Patient Details  Name: [name] MRN: [id number] Date of Birth: 01/07/1951  Today's Date: 11/03/2019 OT Individual Time: 6503-5465 OT Individual Time Calculation (min): 59 min   Skilled Therapeutic Interventions/Progress Updates:    Pt greeted in bed with no c/o pain. Agreeable to complete bathing/dressing tasks sit<stand at sink today. Supine<sit completed with Min A for trunk elevation. While he was EOB, OT donned shoes with Lt AFO component. Stand pivot<w/c completed with RW and Max A. Pt with poor trunk control, L LE instability, and improper mgt of RW during transfer. Spouse [name] and son arrived at this time for family education. During bathing tasks, OT demonstrated to family HOH technique for integrating L UE. Pt does exhibit proximal and distal muscle activation though L UE movement is ataxic. Used LH sponge for pt to wash his feet. Mod A for sit<stand at sink for OT to complete pericare. Pt able to stand for ~10 second windows before needing to sit back down. Used a Secondary school teacher for threading LEs into pants with spouse reporting they already have a reacher at home. Pt was able to assist with elevating pants on Rt side with Mod balance assist and manual facilitation for increasing Lt weight shift due to offloading tendencies. Educated family on importance of keeping Lt UE supported on sink or in lap for joint protection. Discussed his subluxation and importance of daily ROM, positioning, and elevation. We also went over hemi dressing techniques and ways to facilitate increased independence vs doing a task for pt. Also discussed need for Lt AFO during stands and transfers, demonstrated to family how to don/doff during session. At end of tx pt was agreeable to remain up in w/c for next therapist. Affected UE elevated with arm trough and pillow. Educated family that they cannot assist pt with transfers at this time. They verbalized understanding, very receptive to  education today. Spouse states she will be able to provide 24/7 supervision and assist, though son lives "6 minutes away" and can be help her whenever needed.      Therapy Documentation Precautions:  Precautions Precautions: Fall Precaution Comments: Sacral decubitus wounds, Lt hemi Restrictions Weight Bearing Restrictions: No Pain: in Lt arm, repositioning and elevation strategies utilized during session    ADL: ADL Eating: Maximal assistance(for sitting balance) Where Assessed-Eating: Edge of bed Grooming: Maximal assistance Where Assessed-Grooming: Edge of bed Upper Body Bathing: Maximal assistance Where Assessed-Upper Body Bathing: Edge of bed Lower Body Bathing: Dependent(+2 assist) Where Assessed-Lower Body Bathing: Bed level Upper Body Dressing: Dependent Where Assessed-Upper Body Dressing: Edge of bed Lower Body Dressing: Dependent(+2 assist) Where Assessed-Lower Body Dressing: Bed level Toileting: Not assessed Toilet Transfer: Not assessed Tub/Shower Transfer: Not assessed     Therapy/Group: Individual Therapy  Bobby Ragan A Lasean Gorniak 11/03/2019, 4:06 PM

## 2019-11-04 ENCOUNTER — Inpatient Hospital Stay (HOSPITAL_COMMUNITY): Payer: Medicare HMO | Admitting: Occupational Therapy

## 2019-11-04 ENCOUNTER — Inpatient Hospital Stay (HOSPITAL_COMMUNITY): Payer: Medicare HMO | Admitting: Physical Therapy

## 2019-11-04 ENCOUNTER — Inpatient Hospital Stay (HOSPITAL_COMMUNITY): Payer: Medicare HMO

## 2019-11-04 LAB — BASIC METABOLIC PANEL
Anion gap: 7 (ref 5–15)
BUN: 23 mg/dL (ref 8–23)
CO2: 24 mmol/L (ref 22–32)
Calcium: 10 mg/dL (ref 8.9–10.3)
Chloride: 111 mmol/L (ref 98–111)
Creatinine, Ser: 2.98 mg/dL — ABNORMAL HIGH (ref 0.61–1.24)
GFR calc Af Amer: 24 mL/min — ABNORMAL LOW (ref >60)
GFR calc non Af Amer: 21 mL/min — ABNORMAL LOW (ref >60)
Glucose, Bld: 97 mg/dL (ref 70–99)
Potassium: 3.3 mmol/L — ABNORMAL LOW (ref 3.5–5.1)
Sodium: 142 mmol/L (ref 135–145)

## 2019-11-04 NOTE — Progress Notes (Signed)
Occupational Therapy Session Note  Patient Details  Name: [name] MRN: [id number] Date of Birth: [birth year]  Today's Date: 11/04/2019 OT Individual Time: 1100-1204 OT Individual Time Calculation (min): 64 min    Short Term Goals: Week 4:  OT Short Term Goal 1 (Week 4): STGs=LTGs secondary to upcoming discharge  Skilled Therapeutic Interventions/Progress Updates:    Upon entering the room, pt supine in bed and sleeping soundly. Pt with no c/o pain this session and agreeable to OT intervention. Pt's wife arriving shortly after OT session and therapist requesting her to participate in hands on education this session. Pt performing supine >sit with min guard to EOB. OT providing education and demonstrations with caregiver providing cuing for stand pivot transfer into wheelchair with RW. Pt needing mod A and LOB as he sits into chair. OT provided education that this transfer may not always be the safest depending on set up and pt's level of fatigue. OT reviewed measurement sheet with caregiver and she reports making error and bedroom door being 34 inches and declining hospital bed. She reports bed being 30 inches at home and OT recommends removal of box springs for safety. OT also recommended bathing and dressing from kitchen with sit <>stand or from EOB with caregiver support. She verbalized understanding. Pt in wheelchair at sink with cuing from caregiver for standing and she demonstrated ability to block L knee secondary to buckling. Focus on slide board transfers wheelchair <> bed based on home set up. This transfer performed on even surface with mod A to the L and min guard to the R. OT providing min cuing for technique and board placement. Pt remained in wheelchair at end of session with call bell and all needed items within reach.   Therapy Documentation Precautions:  Precautions Precautions: Fall Precaution Comments: Sacral decubitus wounds, Lt hemi Restrictions Weight Bearing  Restrictions: No ADL: ADL Eating: Maximal assistance(for sitting balance) Where Assessed-Eating: Edge of bed Grooming: Maximal assistance Where Assessed-Grooming: Edge of bed Upper Body Bathing: Maximal assistance Where Assessed-Upper Body Bathing: Edge of bed Lower Body Bathing: Dependent(+2 assist) Where Assessed-Lower Body Bathing: Bed level Upper Body Dressing: Dependent Where Assessed-Upper Body Dressing: Edge of bed Lower Body Dressing: Dependent(+2 assist) Where Assessed-Lower Body Dressing: Bed level Toileting: Not assessed Toilet Transfer: Not assessed Tub/Shower Transfer: Not assessed   Therapy/Group: Individual Therapy  Gypsy Decant 11/04/2019, 12:31 PM

## 2019-11-04 NOTE — Progress Notes (Signed)
Pt slept well throughout night without issue

## 2019-11-04 NOTE — Progress Notes (Signed)
Speech Language Pathology Daily Session Note  Patient Details  Name: [name] MRN: [id number] Date of Birth: [birth year]  Today's Date: 11/04/2019 SLP Individual Time: 8182-9937 SLP Individual Time Calculation (min): 28 min  Short Term Goals: Week 4: SLP Short Term Goal 1 (Week 4): Patient will complete higher level reasoning and executive functioning tasks with 90% accuracy and supervision cues. SLP Short Term Goal 2 (Week 4): Patient will utilize external aids to recall fucntional informatin in 75% of opportunities with Min A verbal cues. SLP Short Term Goal 3 (Week 4): Patient will perform 25 repetitions of EMST/IMST exercises with overall supervision verbal cues for accuracy with a self-perceived effort level of 8/10. SLP Short Term Goal 4 (Week 4): Patient will utilize speech intelligibility strategies at the sentence level to achieve ~90% intelligibility with Mod I.  Skilled Therapeutic Interventions: Skilled ST services focused on cognitive skills. Pt was transferring from chair to bed via stedy with wife and NT. Pt expressed fatigue but agreed to participate in SLP services. SLP facilitated reassessment of cognitive lingustic skills given subsections of CLQT pt demonstrated WFL on delayed verbal recall, visual recall, generative naming and confrontation tasks. SLP will continue reassessment in upcoming sessions. Pt demonstrated 90% intelligibility at phrase/sentence level with close proximity. Pt was left in room with call bell within reach and bed alarm set. ST recommends to continue skilled ST services.       Pain Pain Assessment Pain Score: 0-No pain  Therapy/Group: Individual Therapy  Halle Davlin  [facility] 11/04/2019, 4:04 PM

## 2019-11-04 NOTE — Progress Notes (Signed)
[place] PHYSICAL MEDICINE & REHABILITATION PROGRESS NOTE   Subjective/Complaints:  Pt feels like he is urinating a lot, no Left arm pain     ROS: Patient denies CP, SOB, N/V/D  Objective:   No results found. Recent Labs    11/03/19 0655  WBC 4.4  HGB 8.8*  HCT 29.6*  PLT 278   Recent Labs    11/03/19 0655  NA 144  K 3.1*  CL 110  CO2 26  GLUCOSE 90  BUN 24*  CREATININE 2.98*  CALCIUM 9.8    Intake/Output Summary (Last 24 hours) at 11/04/2019 0726 Last data filed at 11/04/2019 0446 Gross per 24 hour  Intake 600 ml  Output 404 ml  Net 196 ml     Physical Exam: Vital Signs Blood pressure (!) 152/88, pulse 93, temperature 98.1 F (36.7 C), resp. rate 16, height 5\' 10"  (1.778 m), weight 81 kg, SpO2 99 %. Constitutional: No distress . Labs and Vital signs reviewed. HEENT: EOMI, oral membranes moist Neck: supple Cardiovascular: tachy 100-110 without murmur. No JVD    Respiratory: CTA Bilaterally without wheezes or rales. Normal effort    GI: BS +, non-tender, non-distended  Skin: Warm and dry.  Intact. Has sacral draining sinus- 6" aquacel pack, no surrounding erythema Left upper extremity sutures intact no drainage.  No erythema Psych: extremely pleasant Musc: No edema in extremities.  No tenderness in extremities. Neuro: Alert Dysphonia, unchanged  Motor: RUE/RLE: 5/5 proximal distal LUE: Shoulder abduction, elbow flexion/extension 4-/5, handgrip 2/5 LLE: Hip flexion, knee extension 4-/5, ankle dorsiflexion 0/5-- Extremities: Left arm edema mainly above the elbow. Assessment/Plan: 1. Functional deficits secondary to Left hemiparesis from RIght parietal infarct   which require 3+ hours per day of interdisciplinary therapy in a comprehensive inpatient rehab setting.  Physiatrist is providing close team supervision and 24 hour management of active medical problems listed below.  Physiatrist and rehab team continue to assess barriers to discharge/monitor  patient progress toward functional and medical goals  Care Tool:  Bathing    Body parts bathed by patient: Chest, Abdomen, Front perineal area, Right upper leg, Left upper leg, Face, Left arm, Right lower leg, Left lower leg   Body parts bathed by helper: Right arm, Buttocks     Bathing assist Assist Level: Moderate Assistance - Patient 50 - 74%     Upper Body Dressing/Undressing Upper body dressing   What is the patient wearing?: Pull over shirt    Upper body assist Assist Level: Moderate Assistance - Patient 50 - 74%    Lower Body Dressing/Undressing Lower body dressing      What is the patient wearing?: Pants, Incontinence brief     Lower body assist Assist for lower body dressing: Maximal Assistance - Patient 25 - 49%(using reacher)     Toileting Toileting Toileting Activity did not occur (Clothing management and hygiene only): N/A (no void or bm)  Toileting assist Assist for toileting: Maximal Assistance - Patient 25 - 49% Assistive Device Comment: urinal    Transfers Chair/bed transfer  Transfers assist  Chair/bed transfer activity did not occur: Safety/medical concerns  Chair/bed transfer assist level: Minimal Assistance - Patient > 75%     Locomotion Ambulation   Ambulation assist   Ambulation activity did not occur: Safety/medical concerns  Assist level: 2 helpers Assistive device: Walker-rolling Max distance: 4   Walk 10 feet activity   Assist  Walk 10 feet activity did not occur: Safety/medical concerns  Walk 50 feet activity   Assist Walk 50 feet with 2 turns activity did not occur: Safety/medical concerns         Walk 150 feet activity   Assist Walk 150 feet activity did not occur: Safety/medical concerns         Walk 10 feet on uneven surface  activity   Assist Walk 10 feet on uneven surfaces activity did not occur: Safety/medical concerns         Wheelchair     Assist   Type of Wheelchair:  Manual    Wheelchair assist level: Supervision/Verbal cueing Max wheelchair distance: 100    Wheelchair 50 feet with 2 turns activity    Assist        Assist Level: Supervision/Verbal cueing   Wheelchair 150 feet activity     Assist      Assist Level: Supervision/Verbal cueing   Blood pressure (!) 152/88, pulse 93, temperature 98.1 F (36.7 C), resp. rate 16, height 5\' 10"  (1.778 m), weight 81 kg, SpO2 99 %.  Medical Problem List and Plan: 1.  Debility secondary to acute hypoxemic RIght parietal infarct with Left hemiparesis LE>UE   Continue CIR- PT, OT, SLP ELOS 1/15 team conf in am  2.  Antithrombotics: -DVT/anticoagulation: Subcutaneous heparin. Negative  vascular study             -antiplatelet therapy: Aspirin 81 mg daily 3. Pain Management: Hycet as needed,some sacral pain but not severe    1/12 4. Mood: Provide emotional support. Rozerem 8 mg nightly             -antipsychotic agents: N/A 5. Neuropsych: This patient is capable of making decisions on his own behalf.          6. Skin/Wound Care: Wound care sacral decubitus with tunneling. , cont mattress overlay, sidelying recommended   -pack Aquacel Ag+ twice daily for WOC     -nutrition, turning, prevalon boots for heels   7. Fluids/Electrolytes/Nutrition: Routine in and outs 8.  Anemia/melena.  Continue Aranesp  Hgb 8.6 on 1/4 9.  Tracheostomy 08/22/2019.  Decannulated.  Diet advanced to regular.  Continue nebulizers as directed 10.  MSSA bacteremia/pneumonia.  Antibiotic therapy completed 11.  Hyperlipidemia.  Lipitor 12.  Post stroke oropharyngeal dysphagia: Appreciate speech therapy recommendations; regular diet with oral care BID  -MMW for thrush 13.  Renal failure after sepsis-resolveing per nephro no need for HD Creat stable at 314.  Infected sacral decub- no I and D per gen surg   -afebrile  No leukocytosis  - completed abx    Continue to monitor 15.  Sleep disturbance no current  c/os  Melatonin started on 12/20 16. Constipation:   17. Hypokalemia. 1/11, 3.1 received 92meq KCL x 2 yesterday   #18.  AV graft placed on the left side in the upper extremity when long-term hemodialysis appeared imminent.  Fortunately renal status has subsequently improved LOS: 32 days A FACE TO Wilkinson E Dustine Bertini 11/04/2019, 7:26 AM

## 2019-11-04 NOTE — Progress Notes (Signed)
Physical Therapy Session Note  Patient Details  Name: [name] MRN: [id number] Date of Birth: 02/08/1951  Today's Date: 11/04/2019 PT Individual Time: 0900-1000 and 1405-1450  PT Individual Time Calculation (min): 60 min and 45 min  Short Term Goals: Week 5:  PT Short Term Goal 1 (Week 5): STG=LTG due to ELOS  Skilled Therapeutic Interventions/Progress Updates: Pt presented in bed agreeable to therapy. Pt c/o pain in L subluxed shoulder however did not rate. Performed supine to sit with minA and cues for pushing through with LUE. PTA donned shoes/AFO total A for time management. Performed SB transfer uphill to w/c minA. Pt propelled to nsg station supervision with min verbal cues for L obstacle negotiation. Pt transported remaining distance to rehab gym. Performed SB transfer uphill to 24in with minA fading to CGA. Participated in STS x 5 from 24in mat with emphasis on increasing anterior translation during transitional movement and L knee extension when in standing and wt shifting to L. Also used mirror feedback for improved wt shifting. Performed lateral leans to L for forced use of LUE and wt bearing through LUE x 5. Performed lateral steps to R with pt demonstrating fair placement of LLE without requiring PTA assist. After therapeutic break pt performed stand pivot transfer to w/c with modA!. Pt transported back to room at end of session and agreeable to remain in w/c until next session (1 hour). Pt left in w/c with call bell within reach and needs met.   Tx2: Pt presented in w/c agreeable to therapy with wife present. Session focused on hands on family training with wife focusing on SB transfers. Pt transported to ADL apt and performed SB transfer to standard bed. Wife initially required mod cues for SB placement and hand placement for assist with wife providing mod A. With cues, wife then provided improved cues and technique with providing modA uphill and minA downhill. Pt and wife  demonstrated improved communication with blocked practice. Pt transported back to room with pt indicating that he needed to use restroom. Pt set up in Stedy and performed STS with modA from w/c. Pt then handed off to Mark NT for toileting.      Therapy Documentation Precautions:  Precautions Precautions: Fall Precaution Comments: Sacral decubitus wounds, Lt hemi Restrictions Weight Bearing Restrictions: No General:   Vital Signs: Therapy Vitals Temp: 98.8 F (37.1 C) Pulse Rate: (!) 112 Resp: 20 BP: (!) 141/81 Patient Position (if appropriate): Sitting Oxygen Therapy SpO2: 100 % Pain: Pain Assessment Pain Score: 0-No pain Mobility:   Locomotion :    Trunk/Postural Assessment :    Balance:   Exercises:   Other Treatments:      Therapy/Group: Individual Therapy    11/04/2019, 4:15 PM  

## 2019-11-05 ENCOUNTER — Inpatient Hospital Stay (HOSPITAL_COMMUNITY): Payer: Medicare HMO | Admitting: Physical Therapy

## 2019-11-05 ENCOUNTER — Inpatient Hospital Stay (HOSPITAL_COMMUNITY): Payer: Medicare HMO | Admitting: Occupational Therapy

## 2019-11-05 ENCOUNTER — Inpatient Hospital Stay (HOSPITAL_COMMUNITY): Payer: Medicare HMO

## 2019-11-05 MED ORDER — POTASSIUM CHLORIDE CRYS ER 20 MEQ PO TBCR
40.0000 meq | EXTENDED_RELEASE_TABLET | Freq: Two times a day (BID) | ORAL | Status: AC
  Administered 2019-11-05 – 2019-11-06 (×3): 40 meq via ORAL
  Filled 2019-11-05 (×3): qty 2

## 2019-11-05 NOTE — Progress Notes (Signed)
Speech Language Pathology Daily Session Note  Patient Details  Name: [name] MRN: [id number] Date of Birth: 1951-10-22  Today's Date: 11/05/2019 SLP Individual Time: 5726-2035 SLP Individual Time Calculation (min): 31 min  Short Term Goals: Week 4: SLP Short Term Goal 1 (Week 4): Patient will complete higher level reasoning and executive functioning tasks with 90% accuracy and supervision cues. SLP Short Term Goal 2 (Week 4): Patient will utilize external aids to recall fucntional informatin in 75% of opportunities with Min A verbal cues. SLP Short Term Goal 3 (Week 4): Patient will perform 25 repetitions of EMST/IMST exercises with overall supervision verbal cues for accuracy with a self-perceived effort level of 8/10. SLP Short Term Goal 4 (Week 4): Patient will utilize speech intelligibility strategies at the sentence level to achieve ~90% intelligibility with Mod I.  Skilled Therapeutic Interventions: Skilled ST services focused on cognitive skills. SLP continued formal cognitive linguistic reassessment, pt demonstrated moderate impairment in executive function and mild impairments in language, attention, memory and visualspatial skills. SLP provided education of results and need for continued ST services, pt agreered. Pt's wife arrived and SLP/wife provided supervision A for slide board transfer from [facility] to bed. Pt was left in room with wife, call bell within reach and bed alarm set. ST recommends to continue skilled ST services.      Pain Pain Assessment Pain Score: 0-No pain  Therapy/Group: Individual Therapy  Niti Leisure  [facility] 11/05/2019, 12:31 PM

## 2019-11-05 NOTE — Discharge Summary (Signed)
Physician Discharge Summary  Patient ID: [name] MRN: [id number] DOB/AGE: 68-15-1952 68 y.o.  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 Request to IR for removal of tunneled left IJ hemodialysis catheter. EXAM: REMOVAL OF TUNNELED HEMODIALYSIS CATHETER MEDICATIONS: 6 mL 1% lidocaine COMPLICATIONS: None immediate. PROCEDURE: Informed written consent was obtained from the patient following an explanation of the procedure, risks, benefits and alternatives to treatment. A time out was performed prior to the initiation of the procedure. Maximal barrier sterile technique was utilized including caps, mask, sterile gowns, sterile gloves, large sterile drape, hand hygiene, and Hibiclens. 1% lidocaine was injected under sterile conditions along the subcutaneous tunnel. Utilizing a combination of blunt dissection and gentle traction, the catheter was removed intact. Hemostasis was obtained with manual compression. A dressing was placed. The patient tolerated the procedure well without immediate post procedural complication. IMPRESSION: Successful removal of tunneled dialysis catheter. Read by Candiss Norse, PA-C Electronically Signed    By: Jacqulynn Cadet M.D.   On: 10/29/2019 11:02   VAS [place] UPPER EXTREMITY VENOUS DUPLEX  Result Date: 10/21/2019 UPPER VENOUS STUDY  Indications: Swelling, and skin sloughing Risk Factors: Right fistula (1st stage left AVF). Recent CVA. Limitations: Patient positioning. Comparison Study: No prior study Performing Technologist: Sharion Dove RVS  Examination Guidelines: A complete evaluation includes B-mode imaging, spectral Doppler, color Doppler, and power Doppler as needed of all accessible portions of each vessel. Bilateral testing is considered an integral part of a complete examination. Limited examinations for reoccurring indications may be performed as noted.  Left Findings: +----------+------------+---------+-----------+----------+--------------+ LEFT      CompressiblePhasicitySpontaneousProperties   Summary     +----------+------------+---------+-----------+----------+--------------+ IJV           Full       Yes       Yes                             +----------+------------+---------+-----------+----------+--------------+ Subclavian               Yes       Yes                             +----------+------------+---------+-----------+----------+--------------+ Axillary                 Yes       Yes                             +----------+------------+---------+-----------+----------+--------------+ Brachial      Full                                                 +----------+------------+---------+-----------+----------+--------------+  Radial        Full                                                 +----------+------------+---------+-----------+----------+--------------+ Ulnar         Full                                                 +----------+------------+---------+-----------+----------+--------------+ Cephalic                                            Not visualized  +----------+------------+---------+-----------+----------+--------------+ Basilic                                             Not visualized +----------+------------+---------+-----------+----------+--------------+  Summary:  Left: No evidence of deep vein thrombosis in the upper extremity.  *See table(s) above for measurements and observations.  Diagnosing physician: Ruta Hinds MD Electronically signed by Ruta Hinds MD on 10/21/2019 at 1:47:13 PM.    Final     Labs:  Basic Metabolic Panel: Recent Labs  Lab 11/03/19 0655 11/04/19 1059 11/06/19 0506  NA 144 142 144  K 3.1* 3.3* 4.0  CL 110 111 111  CO2 26 24 27   GLUCOSE 90 97 94  BUN 24* 23 23  CREATININE 2.98* 2.98* 2.77*  CALCIUM 9.8 10.0 10.0    CBC: Recent Labs  Lab 11/03/19 0655  WBC 4.4  NEUTROABS 2.4  HGB 8.8*  HCT 29.6*  MCV 94.9  PLT 278    CBG: No results for input(s): GLUCAP in the last 168 hours.  Family history.  Negative for colon cancer or colon polyps or rectal cancer.  Mother does have history of hypertension and hyperlipidemia.  Brief HPI:   [name] is a 69 y.o. right-handed male with history of GERD who presented to [facility] 07/26/2019 with shortness of breath and hypoxia.  He was diagnosed with COVID-19 on September 20 of 2020.  Oxygen saturations in the 40th percentile range.  Placed on nonrebreather mask.  He was given IV steroids as well as remdesivir that completed 07/30/2019.  He did require intubation for airway protection.  He had 1 unit of convalescent plasma and received Decadron until 08/04/2019.  He was treated with vancomycin and cefepime through 08/02/2019.  Initial chest x-ray demonstrated diffuse multifocal infiltrates.  CT of the chest negative for pulmonary emboli.  He was admitted to [facility] pH 7.41, PCO2 44 PO2 of 87 on 100% and AA gradient calculates out to 571.  Noted elevated creatinine 1.51.  Renal service is consulted for AKI necessitating the  need for hemodialysis 08/07/2019 but since been deemed chronic Tuesday Thursday Saturday schedule.  Gastroenterology services consulted for ongoing melena drop in hemoglobin transfused 10 units of packed red blood cells throughout hospital stay with latest hemoglobin 8.4.  An endoscopy was completed showing no active bleeding or blood in the stomach.  Very large gastric residual mostly tube feeding without anatomic gastric outlet obstruction suggestive  of gastric ileus.  Patient's hemoglobin hematocrit stabilized.  Long-term intubation requiring tracheostomy tube 08/22/2019 and patient since was extubated.  Diet advanced to regular consistency.  Subcutaneous heparin for DVT prophylaxis.  Hospital course complicated by bouts of atrial fibrillation with RVR 08/09/2019 initially placed on amiodarone.  Neurology services consulted 08/27/2019 for altered mental status with MRI of the brain showing small acute early subacute infarction bilateral cerebral cortex and right cerebellum maintained on low-dose aspirin for CVA prophylaxis.  Patient developed sacral decubitus wound dressing changes as per wound care nurse as well as wound care to partial-thickness area of tissue loss at posterior aspect of penile shaft again with dressing changes as directed.  Patient was admitted for a comprehensive rehab program   Hospital Course: [name] was admitted to rehab 10/03/2019 for inpatient therapies to consist of PT, ST and OT at least three hours five days a week. Past admission physiatrist, therapy team and rehab RN have worked together to provide customized collaborative inpatient rehab.  Pertaining to patient's debility related to acute hypoxic respiratory failure as well as right parietal infarction left hemiparesis lower extremity greater than upper extremity.  Patient remained on low-dose aspirin therapy would follow-up neurology services.  Subcutaneous heparin for DVT prophylaxis vascular studies negative.  Pain  management use of Hycet as needed.  In regards to patient's sacral decubitus with tunneling initially on a mattress overlay wound care nurse follow-up as well as general surgery no current indication for surgical debridement no surrounding cellulitis noted and patient currently receiving normal saline to sacral wound pat gently dry flushed with normal saline prior to packing with field defect with iodoform packing strips top with dry gauze ABD pads changed twice daily.  Home health nurse have been arranged for his necessary wound care.  Patient would need follow-up with general surgery.  Anemia bouts of melena remained on Aranesp.  In regards to patient's acute renal failure appears to be CKD stage III at baseline had ATN with AKI from Covid related septic shock started on CRRT 08/07/2019 converted to hemodialysis 08/26/2019 then started making urine hemodialysis held last received 10/16/2019 hemodialysis catheter since been removed.  Tracheostomy tube 08/22/2019 decannulated diet advanced to regular oxygen saturations greater than 90% on room air.   Blood pressures were monitored on TID basis and somewhat soft and controlled   He/ is continent of bowel and bladder.  He/has made gains during rehab stay and is attending therapies  He/ will continue to receive follow up therapies   after discharge  Rehab course: During patient's stay in rehab weekly team conferences were held to monitor patient's progress, set goals and discuss barriers to discharge. At admission, patient required +2 physical assist side-lying to sitting, +2 physical assist supine to sit total assist sit to supine and max assist sit to side-lying.  Max assist upper body bathing total assist toileting max assist upper body dressing  Physical exam.  128/70 pulse 80 respirations 18 oxygen saturations 92% room air General.  No distress normal appearing HEENT oral mucosa pink and moist, NCAT Cardiac regular rate rhythm without any extra  sounds or murmur heard Chest.  Clear to auscultation without rhonchi or wheeze Neck.  Supple nontender no JVD without thyromegaly GI.  Soft nontender positive bowel sounds without rebound Musculoskeletal 4 out of 5 strength throughout no focal deficits but diffusely deconditioned Neurological makes good eye contact with examiner speech was a bit hoarse but intelligible follows commands. Skin.  Sacral decubitus with tunneling.  He/  has had improvement in activity tolerance, balance, postural control as well as ability to compensate for deficits. He/ has had improvement in functional use RUE/LUE  and RLE/LLE as well as improvement in awareness.  Working with energy conservation techniques.  Patient perform supine to sit with minimal assist with head of bed slightly elevated.  Patient's wife with set up slide board and assist with sliding board transfers to wheelchair with minimal assist demonstrating fair safety and providing appropriate verbal cues.  Participated in sliding board transfers to the car working with his wife.  PTA and patient perform slide board transfers to car with minimal assist and max cues.  Patient was then able to get out of car with minimal assist improved sequencing.  Perform wheelchair mobility 100 feet supervision.  Perform stand pivot to the bed PTA set up wheelchair to pivot to the right for optimal outcomes.  Occupational Therapy continue to discuss home set up and along with patient decided that at home patient would likely sit edge of bed to wash upper body and return to supine for lower body bathing and dressing task as he was more independent and decreased fall risk.  Patient is bathing with minimal assist upper body for hand over hand assistance to utilize left upper extremity to wash right upper extremity.  Full family teaching was completed and plan discharge to home       Disposition: Discharge to home    Diet: Regular  Special Instructions: No driving smoking  or alcohol  Wound care per home health nurse of sacral wound.  Cleanse with normal saline pat gently dry.  Flushed wound with normal saline prior to packing.  Field defect with iodoform packing strip top with dry gauze, ABD pads.  Change as needed for soiling otherwise twice daily.  Medications at discharge 1.  Tylenol as needed 2.  Aspirin 81 mg p.o. daily 3.  Lipitor 40 mg p.o. daily 4.  Voltaren gel 2 g 4 times a day to affected area 5.  Hycet 7.5-3.25 mg 10 mL every 4 hours as needed pain 6.  Lidoderm patch change as directed 7.  Melatonin 1.5 mg nightly as needed 8.  Multivitamin daily 9.  Protonix 40 mg p.o. daily 10.  MiraLAX twice daily hold for loose stools 11.  Rozerem 8 mg p.o. nightly 12.  Senokot S2 tablets p.o. twice daily 13.  Carafate 1 g p.o. every 6 hours  Discharge Instructions    Ambulatory referral to Neurology   Complete by: As directed    An appointment is requested in approximately 4 to 6 weeks bilateral cerebral cortex and right cerebellum infarction   Ambulatory referral to Physical Medicine Rehab   Complete by: As directed    Moderate complexity Low up 1 to 2 weeks left ACA infarction      Follow-up Information    Kirsteins, Luanna Salk, MD Follow up.   Specialty: Physical Medicine and Rehabilitation Why: Office to call for appointment Contact information: [place] [address] [phone]        Ronnette Juniper, MD Follow up.   Specialty: Gastroenterology Why: Call for appointment as needed Contact information: [place] [address] [phone]        Edrick Oh, MD Follow up.   Specialty: Nephrology Why: Call for appointment Contact information: [place] [address] [phone]        Angelia Mould, MD In 6 weeks.   Specialties:  Vascular Surgery, Cardiology Why: Office will call patient for follow-up appt Contact information: [address] [place]  [address] [phone]        Jesusita Oka, MD Follow up.   Specialty: Surgery Why: call for appointment for sacral wound Contact information: [place] [address] [phone]           Signed: Cathlyn Parsons 11/07/2019, 5:19 AM

## 2019-11-05 NOTE — Progress Notes (Signed)
Occupational Therapy Session Note  Patient Details  Name: [name] MRN: [id number] Date of Birth: 07/29/1951  Today's Date: 11/05/2019 OT Individual Time: 8182-9937 OT Individual Time Calculation (min): 57 min    Short Term Goals: Week 4:  OT Short Term Goal 1 (Week 4): STGs=LTGs secondary to upcoming discharge  Skilled Therapeutic Interventions/Progress Updates:    Upon entering the room, pt supine in bed and finishing breakfast tray. Pt with no c/o pain this session and agreeable to OT intervention. OT continued to discuss home set up and along with pt decided that at home pt would likely sit on EOB to wash UB and return to supine for LB bathing and dressing tasks as he is more independent and decreased fall risk  To perform task in this manner. Wife reported she didn't feel safe with sit <>Stand bathing at dressing tasks at kitchen sink as pt is unable to get into bathroom with wheelchair. Pt bathing with min A UB for hand over hand assistance to utilize L UE to wash R UE. Focus on hemiplegic dressing technique with mod cuing and mod A to get over head and down trunk. Pt returning to supine with min A . Pt washing peri area and buttocks with set up A. Pt is able to utilize LH sponge to wash LE and feet. Use of reacher to thread pants onto B feet and pt able to pull over R hip, with bridging, but needing assistance to pull over L hip. Pt declined transfer into wheelchair this session and verbalized that wheelchair was very uncomfortable. Call bell and all needed items within reach upon exiting the room.   Therapy Documentation Precautions:  Precautions Precautions: Fall Precaution Comments: Sacral decubitus wounds, Lt hemi Restrictions Weight Bearing Restrictions: No General:   Vital Signs: Oxygen Therapy SpO2: 95 % O2 Device: Room Air ADL: ADL Eating: Maximal assistance(for sitting balance) Where Assessed-Eating: Edge of bed Grooming: Maximal assistance Where  Assessed-Grooming: Edge of bed Upper Body Bathing: Maximal assistance Where Assessed-Upper Body Bathing: Edge of bed Lower Body Bathing: Dependent(+2 assist) Where Assessed-Lower Body Bathing: Bed level Upper Body Dressing: Dependent Where Assessed-Upper Body Dressing: Edge of bed Lower Body Dressing: Dependent(+2 assist) Where Assessed-Lower Body Dressing: Bed level Toileting: Not assessed Toilet Transfer: Not assessed Tub/Shower Transfer: Not assessed   Therapy/Group: Individual Therapy  Gypsy Decant 11/05/2019, 10:32 AM

## 2019-11-05 NOTE — Progress Notes (Signed)
Occupational Therapy Session Note  Patient Details  Name: [name] MRN: [id number] Date of Birth: 06-12-1951  Today's Date: 11/05/2019 OT Individual Time: 1000-1030 OT Individual Time Calculation (min): 30 min    Short Term Goals: Week 1:  OT Short Term Goal 1 (Week 1): Pt will complete toilet or BSC transfer with 2 assist for OOB toileting OT Short Term Goal 1 - Progress (Week 1): Progressing toward goal OT Short Term Goal 2 (Week 1): Pt will complete 1 grooming task w/c level at sink with mod vcs to include Lt limb OT Short Term Goal 2 - Progress (Week 1): Met OT Short Term Goal 3 (Week 1): Pt will don overhead shirt and initiate threading L UE first OT Short Term Goal 3 - Progress (Week 1): Progressing toward goal Week 2:  OT Short Term Goal 1 (Week 2): Pt will demonstrate carryover of 1 self ROM exercise with mod vcs OT Short Term Goal 1 - Progress (Week 2): Met OT Short Term Goal 2 (Week 2): Pt will complete BSC transfer with 2 assist for OOB toileting OT Short Term Goal 2 - Progress (Week 2): Progressing toward goal OT Short Term Goal 3 (Week 2): Pt will complete 1 self care task EOB with no more than Mod A for balance OT Short Term Goal 3 - Progress (Week 2): Met Week 3:  OT Short Term Goal 1 (Week 3): Pt will complete BSC or toilet transfer with 1 assist using LRAD OT Short Term Goal 1 - Progress (Week 3): Met OT Short Term Goal 2 (Week 3): Pt will complete 2 grooming tasks EOB with no more than Min A for balance OT Short Term Goal 2 - Progress (Week 3): Met OT Short Term Goal 3 (Week 3): Pt will complete UB dressing with Mod A OT Short Term Goal 3 - Progress (Week 3): Met  Skilled Therapeutic Interventions/Progress Updates:    1:1 Pt in bed when arrived. Pt reports needing to go to the rest room. Pt transitioned from supine to sitting EOB with min A. Scoot/ squat  Pivot from bed to w/c with mod A with extra time and multible scoots. Pt taken into the bathroom and was able  to perform stand pivot transfer with mod A over to the toilet with A to manage left LE with AFO donned. After voiding, pt able to come into and remain in standing while hygiene was performed by therapist (pt holding onto grab bar). Pt perform stand pivot back to new w/c (he will d/c home with) with mod A with extra time and management of left LE. Pt agreed to remain up in the w/c for a while with call bell.   Therapy Documentation Precautions:  Precautions Precautions: Fall Precaution Comments: Sacral decubitus wounds, Lt hemi Restrictions Weight Bearing Restrictions: No General:  Pain: Pain Assessment Pain Score: 0-No pain   Therapy/Group: Individual Therapy  Willeen Cass [facility] 11/05/2019, 2:47 PM

## 2019-11-05 NOTE — Progress Notes (Signed)
[place] PHYSICAL MEDICINE & REHABILITATION PROGRESS NOTE   Subjective/Complaints:   Long discussion about pt's illness and social supports, pt feels fortunate to be doing this well.     ROS: Patient denies CP, SOB, N/V/D  Objective:   No results found. Recent Labs    11/03/19 0655  WBC 4.4  HGB 8.8*  HCT 29.6*  PLT 278   Recent Labs    11/03/19 0655 11/04/19 1059  NA 144 142  K 3.1* 3.3*  CL 110 111  CO2 26 24  GLUCOSE 90 97  BUN 24* 23  CREATININE 2.98* 2.98*  CALCIUM 9.8 10.0    Intake/Output Summary (Last 24 hours) at 11/05/2019 0828 Last data filed at 11/05/2019 0600 Gross per 24 hour  Intake 120 ml  Output 525 ml  Net -405 ml     Physical Exam: Vital Signs Blood pressure 131/88, pulse 88, temperature 98.4 F (36.9 C), resp. rate 16, height _0  (1.778 m), weight 81 kg, SpO2 95 %. Constitutional: No distress . Labs and Vital signs reviewed. HEENT: EOMI, oral membranes moist Neck: supple Cardiovascular: tachy 100-110 without murmur. No JVD    Respiratory: CTA Bilaterally without wheezes or rales. Normal effort    GI: BS +, non-tender, non-distended  Skin: Warm and dry.  Intact. Has sacral draining sinus- 6" aquacel pack, no surrounding erythema Left upper extremity sutures intact no drainage.  No erythema Psych: extremely pleasant Musc: No edema in extremities.  No tenderness in extremities. Neuro: Alert Dysphonia, unchanged  Motor: RUE/RLE: 5/5 proximal distal LUE: Shoulder abduction, elbow flexion/extension 4-/5, handgrip 2/5 LLE: Hip flexion, knee extension 4-/5, ankle dorsiflexion 0/5-- Extremities: Left arm edema mainly above the elbow. Assessment/Plan: 1. Functional deficits secondary to Left hemiparesis from RIght parietal infarct   which require 3+ hours per day of interdisciplinary therapy in a comprehensive inpatient rehab setting.  Physiatrist is providing close team supervision and 24 hour management of active medical problems  listed below.  Physiatrist and rehab team continue to assess barriers to discharge/monitor patient progress toward functional and medical goals  Care Tool:  Bathing    Body parts bathed by patient: Chest, Abdomen, Front perineal area, Right upper leg, Left upper leg, Face, Left arm, Right lower leg, Left lower leg   Body parts bathed by helper: Right arm, Buttocks     Bathing assist Assist Level: Moderate Assistance - Patient 50 - 74%     Upper Body Dressing/Undressing Upper body dressing   What is the patient wearing?: Pull over shirt    Upper body assist Assist Level: Moderate Assistance - Patient 50 - 74%    Lower Body Dressing/Undressing Lower body dressing      What is the patient wearing?: Pants, Incontinence brief     Lower body assist Assist for lower body dressing: Maximal Assistance - Patient 25 - 49%(using reacher)     Toileting Toileting Toileting Activity did not occur (Clothing management and hygiene only): N/A (no void or bm)  Toileting assist Assist for toileting: Maximal Assistance - Patient 25 - 49% Assistive Device Comment: urinal    Transfers Chair/bed transfer  Transfers assist  Chair/bed transfer activity did not occur: Safety/medical concerns  Chair/bed transfer assist level: Moderate Assistance - Patient 50 - 74%     Locomotion Ambulation   Ambulation assist   Ambulation activity did not occur: Safety/medical concerns  Assist level: 2 helpers Assistive device: Walker-rolling Max distance: 4   Walk 10 feet activity   Assist  Walk  10 feet activity did not occur: Safety/medical concerns        Walk 50 feet activity   Assist Walk 50 feet with 2 turns activity did not occur: Safety/medical concerns         Walk 150 feet activity   Assist Walk 150 feet activity did not occur: Safety/medical concerns         Walk 10 feet on uneven surface  activity   Assist Walk 10 feet on uneven surfaces activity did not  occur: Safety/medical concerns         Wheelchair     Assist   Type of Wheelchair: Manual    Wheelchair assist level: Supervision/Verbal cueing Max wheelchair distance: 100    Wheelchair 50 feet with 2 turns activity    Assist        Assist Level: Supervision/Verbal cueing   Wheelchair 150 feet activity     Assist      Assist Level: Supervision/Verbal cueing   Blood pressure 131/88, pulse 88, temperature 98.4 F (36.9 C), resp. rate 16, height _0  (1.778 m), weight 81 kg, SpO2 95 %.  Medical Problem List and Plan: 1.  Debility secondary to acute hypoxemic RIght parietal infarct with Left hemiparesis LE>UE   Continue CIR- PT, OT, SLP ELOS 1/15 Team conference today please see physician documentation under team conference tab, met with team face-to-face to discuss problems,progress, and goals. Formulized individual treatment plan based on medical history, underlying problem and comorbidities. 2.  Antithrombotics: -DVT/anticoagulation: Subcutaneous heparin. Negative  vascular study             -antiplatelet therapy: Aspirin 81 mg daily 3. Pain Management: Hycet as needed,some sacral pain but not severe    1/12 4. Mood: Provide emotional support. Rozerem 8 mg nightly             -antipsychotic agents: N/A 5. Neuropsych: This patient is capable of making decisions on his own behalf.          6. Skin/Wound Care: Wound care sacral decubitus with tunneling. , cont mattress overlay, sidelying recommended   -pack Aquacel Ag+ twice daily for WOC     -nutrition, turning, prevalon boots for heels   7. Fluids/Electrolytes/Nutrition: Routine in and outs 8.  Anemia/melena.  Continue Aranesp  Hgb 8.6 on 1/4 9.  Tracheostomy 08/22/2019.  Decannulated.  Diet advanced to regular.  Continue nebulizers as directed 10.  MSSA bacteremia/pneumonia.  Antibiotic therapy completed 11.  Hyperlipidemia.  Lipitor 12.  Post stroke oropharyngeal dysphagia: Appreciate speech  therapy recommendations; regular diet with oral care BID  -MMW for thrush 13.  Renal failure after sepsis-resolveing per nephro no need for HD Creat stable at 314.  Infected sacral decub- no I and D per gen surg   -afebrile  No leukocytosis  - completed abx    Continue to monitor 15.  Sleep disturbance no current c/os  Melatonin started on 12/20 16. Constipation:   17. Hypokalemia. 1/11, 3.1 received 15mq KCL x 2 yesterday . Repeat K 3.3 will repeat  KCL 42m BID x 2 d  #18.  AV graft placed on the left side in the upper extremity when long-term hemodialysis appeared imminent.  Fortunately renal status has subsequently improved LOS: 33 days A FACE TO FASan Lorenzo Nuriyah Hanline 11/05/2019, 8:28 AM

## 2019-11-05 NOTE — Progress Notes (Signed)
Physical Therapy Session Note  Patient Details  Name: Spence Soberano MRN: [id number] Date of Birth: Mar 12, 1951  Today's Date: 11/05/2019 PT Individual Time: 1335-1430 PT Individual Time Calculation (min): 55 min   Short Term Goals: Week 5:  PT Short Term Goal 1 (Week 5): STG=LTG due to ELOS  Skilled Therapeutic Interventions/Progress Updates:  Pt presented in bed with wife present agreeable to therapy. Pt performed supine to sit with minA with HOB slightly elevated. Pt's wife Blanch Media set up [employer] and assisted with SB transfer to w/c with minA demonstrating fair safety and providing appropriate verbal cues. Pt transported to ortho gym and participated in SB transfers to car. Per wife pt will transport home in [place]. PTA and pt performed SB transfer to car with minA and max multimodal cues due to pt pushing posteriorly. Pt was then able to come out of car with minA and improved sequencing. Pt's wife Blanch Media then performed car transfer with PTA supervising. Wife was able to appropriately provide cues and assistance as well as block pt as needed. PTA educated wife on making sure he leans forward vs posteriorly for safety during transfer. Once back in w/c [place] practiced pushing pt up ramp. Pt then performed w/c mobility approx 148ft with supervision with PTA transported pt remaining distance back to room. Pt then requesting to perform stand pivot to bed. PTA set up w/c to pivot to R for optimal outcomes. Pt performed STS with minA but required heavy modA for pivot due to L knee instability with PTA providing max cues for engaging L quads. Once at bed, pt performed sit to supine with minA and was able to boost self up to [facility] via bridge with PTA stabilizing B feet. Pt repositioned to comfort and left with call bell within reach and wife present.      Therapy Documentation Precautions:  Precautions Precautions: Fall Precaution Comments: Sacral decubitus wounds, Lt hemi Restrictions Weight Bearing  Restrictions: No General:   Vital Signs: Therapy Vitals Temp: 98.9 F (37.2 C) Pulse Rate: 96 BP: 133/79 Patient Position (if appropriate): Lying Oxygen Therapy SpO2: 98 % O2 Device: Room Air Pain: Pain Assessment Pain Score: 0-No pain    Therapy/Group: Individual Therapy  Doyle Kunath  Aamina Skiff, PTA  11/05/2019, 4:02 PM

## 2019-11-05 NOTE — Patient Care Conference (Signed)
Inpatient RehabilitationTeam Conference and Plan of Care Update Date: 11/05/2019   Time: 10:00 AM    Patient Name: [name]      Medical Record Number: [id number]  Date of Birth: 27-May-1951 Sex: Male         Room/Bed: 4W03C/4W03C-01 Payor Info: Payor: [employer] MEDICARE / Plan: [place] HMO / Product Type: *No Product type* /    Admit Date/Time:  10/03/2019  2:27 PM  Primary Diagnosis:  Debility  Patient Active Problem List   Diagnosis Date Noted  . Sleep disturbance   . Anemia of chronic disease   . Dependent on hemodialysis ([place])   . Dysphagia, post-stroke   . Pressure injury of sacral region, unstageable ([place])   . Oropharyngeal dysphagia 10/03/2019  . Debility 10/03/2019  . Acute blood loss anemia 09/06/2019  . H. pylori infection 09/06/2019  . CVA (cerebral vascular accident) ([place]) 09/06/2019  . Anoxic brain injury ([place]) 09/06/2019  . MSSA bacteremia 09/06/2019  . Positive D dimer 09/06/2019  . Pseudomonas infection 09/06/2019  . Infection due to acinetobacter baumannii 09/06/2019  . Sinus tachycardia 09/05/2019  . Advanced care planning/counseling discussion   . Goals of care, counseling/discussion   . Palliative care by specialist   . Cerebral thrombosis with cerebral infarction 08/28/2019  . Tracheostomy status ([place])   . Acute respiratory distress syndrome (ARDS) due to COVID-19 virus ([place])   . Chest tube in place   . Primary spontaneous pneumothorax   . Acute respiratory failure ([place])   . Gastric ulcer with hemorrhage 08/10/2019  . Constipation 08/09/2019  . Atrial fibrillation with RVR ([place]) 08/07/2019  . Acute renal failure (ARF) ([place]) 08/06/2019  . GI bleed 08/06/2019  . Pneumothorax on right   . Fluid overload 07/27/2019  . Pneumonia due to COVID-19 virus 07/25/2019  . Acute respiratory failure due to COVID-19 ([place]) 07/25/2019  . AKI (acute kidney injury) ([place]) 07/25/2019  . Dyspepsia 11/26/2014  . BPH (benign prostatic hyperplasia) 10/31/2013   . Achilles rupture 04/05/2011  . S/P Achilles tendon repair 04/05/2011    Expected Discharge Date: Expected Discharge Date: 11/07/19  Team Members Present: Physician leading conference: Dr. Alysia Penna Social Worker Present: Lennart Pall, LCSW Nurse Present: Dorthula Nettles, RN;Deborah Hervey Ard, RN Case Manager: Karene Fry, RN PT Present: Barrie Folk, PT OT Present: Darleen Crocker, OT SLP Present: Charolett Bumpers, SLP PPS Coordinator present : Ileana Ladd, Burna Mortimer, SLP     Current Status/Progress Goal Weekly Team Focus  Bowel/Bladder   Uses urinal for bladder. Incontinent of bowel  Fewer episodes of incontinence  Assess bladder needs   Swallow/Nutrition/ Hydration             ADL's   Mod A for bathing/dressing tasks sit<stand at sink, Mod A slideboard transfers to [facility], Max A toileting, Mod A sit<stand at sink  mod A overall  family education, NMR, d/c planning, ADL retraining   Mobility   minA  bed mobility minA to CGA SB transfers, minA STS in [place], [place] to modA STS, improving standing tolerance, mod/maxA stand pivot transfers  minA overall  L NMR, LLE strengthening, family ed, progression with transfers   Communication   Supervision A  Supervision  education with RMT/strategies and ENT recommendation   Safety/Cognition/ Behavioral Observations  Min-Supervision A  Superivison A  education executive function strategies and recall   Pain   Denies any pain or discomfort on this shift  Remains pain free  Assess for pain q 4hrs and PRN   Skin  Wound to coccyx, tunnelling, packed with iodoform, placed gauze and abd  No new skin breakdown, and infection       Rehab Goals Patient on target to meet rehab goals: Yes *See Care Plan and progress notes for long and short-term goals.     Barriers to Discharge  Current Status/Progress Possible Resolutions Date Resolved   Nursing                  PT                    OT                  SLP                SW                 Discharge Planning/Teaching Needs:  Home with wife and adult son to provide 24/7 assistance.  Teaching begun this week with wife and son   Team Discussion: Doing well, no HD needed, some return L UE/L LE, mood better.  RN concerned about HR increase in afternoons, will ask surgical PA to look at wound prior to DC.  OT mod UB self care, mod LB, mod A slide board transfers, max A toileting, goals mod A, wife in for fam ed yesterday.  PT fam ed Monday for observation, slide board will be safer, pivot mod to max A with fatigue, wife practiced transfers, min A goals.  SLP S/min A executive function and recall, recommending ENT consult.  SW to set up additional fam ed for PT.   Revisions to Treatment Plan: N/A     Medical Summary Current Status: pain improved, K + low Weekly Focus/Goal: supplement KCL recheck BMET  Barriers to Discharge: Medical stability   Possible Resolutions to Barriers: Wound care education   Continued Need for Acute Rehabilitation Level of Care: The patient requires daily medical management by a physician with specialized training in physical medicine and rehabilitation for the following reasons: Direction of a multidisciplinary physical rehabilitation program to maximize functional independence : Yes Medical management of patient stability for increased activity during participation in an intensive rehabilitation regime.: Yes Analysis of laboratory values and/or radiology reports with any subsequent need for medication adjustment and/or medical intervention. : Yes   I attest that I was present, lead the team conference, and concur with the assessment and plan of the team.   Retta Diones 11/06/2019, 10:37 AM   Team conference was held via web/ teleconference due to [place] - 19

## 2019-11-05 NOTE — Progress Notes (Deleted)
PHYSICAL MEDICINE & REHABILITATION PROGRESS NOTE   Subjective/Complaints:   No issues overnite    ROS: Patient denies CP, SOB, N/V/D  Objective:   No results found. Recent Labs    11/03/19 0655  WBC 4.4  HGB 8.8*  HCT 29.6*  PLT 278   Recent Labs    11/03/19 0655 11/04/19 1059  NA 144 142  K 3.1* 3.3*  CL 110 111  CO2 26 24  GLUCOSE 90 97  BUN 24* 23  CREATININE 2.98* 2.98*  CALCIUM 9.8 10.0    Intake/Output Summary (Last 24 hours) at 11/05/2019 0908 Last data filed at 11/05/2019 0845 Gross per 24 hour  Intake 240 ml  Output 525 ml  Net -285 ml     Physical Exam: Vital Signs Blood pressure 131/88, pulse 88, temperature 98.4 F (36.9 C), resp. rate 16, height 5\' 10"  (1.778 m), weight 81 kg, SpO2 95 %. Constitutional: No distress . Labs and Vital signs reviewed. HEENT: EOMI, oral membranes moist Neck: supple Cardiovascular: tachy 100-110 without murmur. No JVD    Respiratory: CTA Bilaterally without wheezes or rales. Normal effort    GI: BS +, non-tender, non-distended  Skin: Warm and dry.  Intact. Has sacral draining sinus- 6" aquacel pack, no surrounding erythema Left upper extremity sutures intact no drainage.  No erythema Psych: extremely pleasant Musc: No edema in extremities.  No tenderness in extremities. Neuro: Alert Dysphonia, unchanged  Motor: RUE/RLE: 5/5 proximal distal LUE: Shoulder abduction, elbow flexion/extension 4-/5, handgrip 2/5 LLE: Hip flexion, knee extension 4-/5, ankle dorsiflexion 0/5-- Extremities: Left arm edema mainly above the elbow. Assessment/Plan: 1. Functional deficits secondary to Left hemiparesis from RIght parietal infarct   which require 3+ hours per day of interdisciplinary therapy in a comprehensive inpatient rehab setting.  Physiatrist is providing close team supervision and 24 hour management of active medical problems listed below.  Physiatrist and rehab team continue to assess barriers to  discharge/monitor patient progress toward functional and medical goals  Care Tool:  Bathing    Body parts bathed by patient: Chest, Abdomen, Front perineal area, Right upper leg, Left upper leg, Face, Left arm, Right lower leg, Left lower leg   Body parts bathed by helper: Right arm, Buttocks     Bathing assist Assist Level: Moderate Assistance - Patient 50 - 74%     Upper Body Dressing/Undressing Upper body dressing   What is the patient wearing?: Pull over shirt    Upper body assist Assist Level: Moderate Assistance - Patient 50 - 74%    Lower Body Dressing/Undressing Lower body dressing      What is the patient wearing?: Pants, Incontinence brief     Lower body assist Assist for lower body dressing: Maximal Assistance - Patient 25 - 49%(using reacher)     Toileting Toileting Toileting Activity did not occur (Clothing management and hygiene only): N/A (no void or bm)  Toileting assist Assist for toileting: Maximal Assistance - Patient 25 - 49% Assistive Device Comment: urinal    Transfers Chair/bed transfer  Transfers assist  Chair/bed transfer activity did not occur: Safety/medical concerns  Chair/bed transfer assist level: Moderate Assistance - Patient 50 - 74%     Locomotion Ambulation   Ambulation assist   Ambulation activity did not occur: Safety/medical concerns  Assist level: 2 helpers Assistive device: Walker-rolling Max distance: 4   Walk 10 feet activity   Assist  Walk 10 feet activity did not occur: Safety/medical concerns  Walk 50 feet activity   Assist Walk 50 feet with 2 turns activity did not occur: Safety/medical concerns         Walk 150 feet activity   Assist Walk 150 feet activity did not occur: Safety/medical concerns         Walk 10 feet on uneven surface  activity   Assist Walk 10 feet on uneven surfaces activity did not occur: Safety/medical concerns         Wheelchair     Assist   Type  of Wheelchair: Manual    Wheelchair assist level: Supervision/Verbal cueing Max wheelchair distance: 100    Wheelchair 50 feet with 2 turns activity    Assist        Assist Level: Supervision/Verbal cueing   Wheelchair 150 feet activity     Assist      Assist Level: Supervision/Verbal cueing   Blood pressure 131/88, pulse 88, temperature 98.4 F (36.9 C), resp. rate 16, height 5\' 10"  (1.778 m), weight 81 kg, SpO2 95 %.  Medical Problem List and Plan: 1.  Debility secondary to acute hypoxemic RIght parietal infarct with Left hemiparesis LE>UE   Continue CIR- PT, OT, SLP ELOS 1/15 . 2.  Antithrombotics: -DVT/anticoagulation: Subcutaneous heparin. Negative  vascular study             -antiplatelet therapy: Aspirin 81 mg daily 3. Pain Management: Hycet as needed,some sacral pain but not severe    1/12 4. Mood: Provide emotional support. Rozerem 8 mg nightly             -antipsychotic agents: N/A 5. Neuropsych: This patient is capable of making decisions on his own behalf.          6. Skin/Wound Care: Wound care sacral decubitus with tunneling. , cont mattress overlay, sidelying recommended   -pack Aquacel Ag+ twice daily for WOC     -nutrition, turning, prevalon boots for heels   7. Fluids/Electrolytes/Nutrition: Routine in and outs 8.  Anemia/melena.  Continue Aranesp  Hgb 8.6 on 1/4 9.  Tracheostomy 08/22/2019.  Decannulated.  Diet advanced to regular.  Continue nebulizers as directed 10.  MSSA bacteremia/pneumonia.  Antibiotic therapy completed 11.  Hyperlipidemia.  Lipitor 12.  Post stroke oropharyngeal dysphagia: Appreciate speech therapy recommendations; regular diet with oral care BID  -MMW for thrush 13.  Renal failure after sepsis-resolveing per nephro no need for HD Creat stable at 314.  Infected sacral decub- no I and D per gen surg   -afebrile  No leukocytosis  - completed abx    Continue to monitor 15.  Sleep disturbance no current  c/os  Melatonin started on 12/20 16. Constipation:   17. Hypokalemia. 1/11, 3.1 received 28meq KCL x 2 yesterday   #18.  AV graft placed on the left side in the upper extremity when long-term hemodialysis appeared imminent.  Fortunately renal status has subsequently improved LOS: 33 days A FACE TO [place] E Morelia Cassells 11/05/2019, 9:08 AM

## 2019-11-06 ENCOUNTER — Inpatient Hospital Stay (HOSPITAL_COMMUNITY): Payer: Medicare HMO | Admitting: Occupational Therapy

## 2019-11-06 ENCOUNTER — Inpatient Hospital Stay (HOSPITAL_COMMUNITY): Payer: Medicare HMO | Admitting: Speech Pathology

## 2019-11-06 ENCOUNTER — Inpatient Hospital Stay (HOSPITAL_COMMUNITY): Payer: Medicare HMO | Admitting: Physical Therapy

## 2019-11-06 LAB — BASIC METABOLIC PANEL
Anion gap: 6 (ref 5–15)
BUN: 23 mg/dL (ref 8–23)
CO2: 27 mmol/L (ref 22–32)
Calcium: 10 mg/dL (ref 8.9–10.3)
Chloride: 111 mmol/L (ref 98–111)
Creatinine, Ser: 2.77 mg/dL — ABNORMAL HIGH (ref 0.61–1.24)
GFR calc Af Amer: 26 mL/min — ABNORMAL LOW (ref >60)
GFR calc non Af Amer: 23 mL/min — ABNORMAL LOW (ref >60)
Glucose, Bld: 94 mg/dL (ref 70–99)
Potassium: 4 mmol/L (ref 3.5–5.1)
Sodium: 144 mmol/L (ref 135–145)

## 2019-11-06 MED ORDER — SENNOSIDES-DOCUSATE SODIUM 8.6-50 MG PO TABS: 2.0000 | ORAL_TABLET | Freq: Two times a day (BID) | ORAL | Status: DC

## 2019-11-06 MED ORDER — ADULT MULTIVITAMIN W/MINERALS CH: 1.0000 | ORAL_TABLET | Freq: Every day | ORAL | Status: DC

## 2019-11-06 MED ORDER — SUCRALFATE 1 GM/10ML PO SUSP: 1.0000 g | Freq: Four times a day (QID) | ORAL | 0 refills | Status: DC

## 2019-11-06 MED ORDER — MELATONIN 3 MG PO TABS: 1.5000 mg | ORAL_TABLET | Freq: Every evening | ORAL | 0 refills | Status: DC | PRN

## 2019-11-06 MED ORDER — RENA-VITE PO TABS: 1.0000 | ORAL_TABLET | Freq: Every day | ORAL | 0 refills | Status: DC

## 2019-11-06 MED ORDER — PANTOPRAZOLE SODIUM 40 MG PO TBEC: 40.0000 mg | DELAYED_RELEASE_TABLET | Freq: Every day | ORAL | 0 refills | Status: DC

## 2019-11-06 MED ORDER — HYDROCODONE-ACETAMINOPHEN 7.5-325 MG/15ML PO SOLN: 10.0000 mL | ORAL | 0 refills | Status: DC | PRN

## 2019-11-06 MED ORDER — RAMELTEON 8 MG PO TABS: 8.0000 mg | ORAL_TABLET | Freq: Every day | ORAL | 0 refills | Status: DC

## 2019-11-06 MED ORDER — DICLOFENAC SODIUM 1 % EX GEL: 2.0000 g | Freq: Four times a day (QID) | CUTANEOUS | 0 refills | Status: DC

## 2019-11-06 MED ORDER — ATORVASTATIN CALCIUM 40 MG PO TABS: 40.0000 mg | ORAL_TABLET | Freq: Every day | ORAL | 0 refills | Status: DC

## 2019-11-06 MED ORDER — POLYETHYLENE GLYCOL 3350 17 G PO PACK: 17.0000 g | PACK | Freq: Two times a day (BID) | ORAL | 0 refills | Status: DC

## 2019-11-06 MED ORDER — NEPRO/CARBSTEADY PO LIQD: 237.0000 mL | Freq: Three times a day (TID) | ORAL | 0 refills | Status: DC

## 2019-11-06 MED ORDER — LIDOCAINE 5 % EX PTCH: 1.0000 | MEDICATED_PATCH | CUTANEOUS | 0 refills | Status: DC

## 2019-11-06 NOTE — Progress Notes (Signed)
Social Work Patient ID: [name], male   DOB: [birth year], 69 y.o.   MRN: [id number]  Met with pt and wife yesterday and both reporting they feel ready for d/c tomorrow.  Reviewed HH follow up.  Wife to be here again today for teaching of wound care with nursing.  Continue to follow.  Whittley Carandang, LCSW

## 2019-11-06 NOTE — Progress Notes (Signed)
Occupational Therapy Discharge Summary  Patient Details  Name: [name] MRN: [id number] Date of Birth: 20-Jun-1951  Today's Date: 11/06/2019 OT Individual Time: 1000-1100 OT Individual Time Calculation (min): 60 min    Patient has met 8 of 9 long term goals due to improved activity tolerance, improved balance, postural control, ability to compensate for deficits and improved coordination.  Patient to discharge at overall mod A and max A for toileting level.  Patient's care partner is independent to provide the necessary physical and cognitive assistance at discharge.    Reasons goals not met: Goal for toileting not met as pt needing max A for this tasks.   Recommendation:  Patient will benefit from ongoing skilled OT services in home health setting to continue to advance functional skills in the area of BADL and iADL.  Equipment: drop arm commode chair  Reasons for discharge: treatment goals met  Patient/family agrees with progress made and goals achieved: Yes   OT Intervention: Upon entering the room, pt supine in bed with no c/o pain and agreeable to OT intervention. Pt donning pull over pants from bed level with mod A. OT educating pt on importance of side lying, changing positions when in bed, and pressure relief in wheelchair. OT demonstrated safe side lying positions and pt verbalized comfort. Supine >sit with min A to EOB. Slide board transfer with min A into wheelchair. Pt seated at sink for grooming tasks with supervision. Pt's wife arriving at end of session and she was providing the above education as well and given handout to demonstrate positioning. They both verbalized understanding. All needs within reach upon exiting the room.   OT Discharge Precautions/Restrictions  Precautions Precautions: Fall Precaution Comments: Sacral decubitus wound and L UE sublux Restrictions Weight Bearing Restrictions: No Pain Pain Assessment Pain Scale: 0-10 Pain Score: 0-No  pain ADL ADL Eating: Maximal assistance(for sitting balance) Where Assessed-Eating: Edge of bed Grooming: Maximal assistance Where Assessed-Grooming: Edge of bed Upper Body Bathing: Maximal assistance Where Assessed-Upper Body Bathing: Edge of bed Lower Body Bathing: Dependent(+2 assist) Where Assessed-Lower Body Bathing: Bed level Upper Body Dressing: Dependent Where Assessed-Upper Body Dressing: Edge of bed Lower Body Dressing: Dependent(+2 assist) Where Assessed-Lower Body Dressing: Bed level Toileting: Not assessed Toilet Transfer: Not assessed Tub/Shower Transfer: Not assessed Vision Baseline Vision/History: Wears glasses Wears Glasses: At all times Perception    Praxis   Cognition Overall Cognitive Status: Impaired/Different from baseline Arousal/Alertness: Awake/alert Orientation Level: Oriented X4 Attention: Selective Selective Attention: Impaired Selective Attention Impairment: Verbal complex;Functional complex Memory: Impaired Memory Impairment: Decreased short term memory Decreased Short Term Memory: Verbal complex;Functional complex Awareness: Impaired Awareness Impairment: Anticipatory impairment Problem Solving: Impaired Problem Solving Impairment: Verbal complex;Functional complex Safety/Judgment: Impaired Sensation Sensation Light Touch: Impaired Detail Light Touch Impaired Details: Impaired LUE;Impaired LLE Coordination Gross Motor Movements are Fluid and Coordinated: No Fine Motor Movements are Fluid and Coordinated: No Motor  Motor Motor: Clonus;Hemiplegia;Motor apraxia;Abnormal tone;Abnormal postural alignment and control;Motor perseverations Motor - Discharge Observations: L hemiplegia Mobility  Bed Mobility Bed Mobility: Rolling Right;Rolling Left;Sit to Supine;Supine to Sit Rolling Right: Minimal Assistance - Patient > 75% Rolling Left: Minimal Assistance - Patient > 75% Supine to Sit: Minimal Assistance - Patient > 75% Sit to Supine:  Minimal Assistance - Patient > 75%  Trunk/Postural Assessment  Cervical Assessment Cervical Assessment: Exceptions to [facility] Thoracic Assessment Thoracic Assessment: Exceptions to WFL(rounded shoulders) Lumbar Assessment Lumbar Assessment: Exceptions to WFL(posterier pelvic tilt)  Balance Balance Balance Assessed: Yes Static Sitting Balance Static Sitting -  Balance Support: Feet supported Static Sitting - Level of Assistance: 5: Stand by assistance Dynamic Sitting Balance Dynamic Sitting - Balance Support: Feet supported Dynamic Sitting - Level of Assistance: 5: Stand by assistance Extremity/Trunk Assessment RUE Assessment RUE Assessment: Within Functional Limits LUE Assessment LUE Assessment: Exceptions to [facility] General Strength Comments: activation in limb distal>proximal, nonfunctional. Sublux noted   Gypsy Decant 11/06/2019, 12:41 PM

## 2019-11-06 NOTE — Plan of Care (Signed)
  Problem: RH BOWEL ELIMINATION Goal: RH STG MANAGE BOWEL WITH ASSISTANCE Description: STG Manage Bowel with min/mod Assistance. 11/06/2019 1031 by Amanda Cockayne, LPN Outcome: Progressing 11/06/2019 1031 by Amanda Cockayne, LPN Reactivated Goal: RH STG MANAGE BOWEL W/MEDICATION W/ASSISTANCE Description: STG Manage Bowel with Medication with mod I Assistance. 11/06/2019 1031 by Toy Cookey F, LPN Outcome: Progressing 11/06/2019 1031 by Amanda Cockayne, LPN Reactivated   Problem: RH BLADDER ELIMINATION Goal: RH STG MANAGE BLADDER WITH ASSISTANCE Description: STG Manage Bladder With min/mod Assistance 11/06/2019 1031 by Amanda Cockayne, LPN Outcome: Progressing 11/06/2019 1031 by Amanda Cockayne, LPN Reactivated   Problem: RH SKIN INTEGRITY Goal: RH STG SKIN FREE OF INFECTION/BREAKDOWN Description: Patients skin will remain free from further infection or breakdown with mod assist. 11/06/2019 1031 by Amanda Cockayne, LPN Outcome: Progressing 11/06/2019 1031 by Amanda Cockayne, LPN Reactivated Goal: RH STG MAINTAIN SKIN INTEGRITY WITH ASSISTANCE Description: STG Maintain Skin Integrity With mod Assistance. 11/06/2019 1031 by Amanda Cockayne, LPN Outcome: Progressing 11/06/2019 1031 by Amanda Cockayne, LPN Reactivated Goal: RH STG ABLE TO PERFORM INCISION/WOUND CARE W/ASSISTANCE Description: STG Able To Perform Incision/Wound Care With total Assistance from caregiver. Outcome: Progressing   Problem: RH SAFETY Goal: RH STG ADHERE TO SAFETY PRECAUTIONS W/ASSISTANCE/DEVICE Description: STG Adhere to Safety Precautions With supervision Assistance/Device. 11/06/2019 1031 by Toy Cookey F, LPN Outcome: Progressing 11/06/2019 1031 by Amanda Cockayne, LPN Reactivated   Problem: RH PAIN MANAGEMENT Goal: RH STG PAIN MANAGED AT OR BELOW PT'S PAIN GOAL Description: < 4 11/06/2019 1031 by Amanda Cockayne, LPN Outcome: Progressing 11/06/2019 1031 by Amanda Cockayne, LPN Reactivated   Problem: RH KNOWLEDGE DEFICIT Goal: RH STG INCREASE KNOWLEDGE OF HYPERTENSION Description: Patient/caregiver will verbalize understanding of management of HTN including diet, medications, monitoring, exercise, and follow up appointments with min assist. 11/06/2019 1031 by Amanda Cockayne, LPN Outcome: Progressing 11/06/2019 1031 by Amanda Cockayne, LPN Reactivated Goal: RH STG INCREASE KNOWLEGDE OF HYPERLIPIDEMIA Description: Patient/caregiver will verbalize understanding of management of HLD including diet, medications, monitoring, exercise, and follow up appointments with min assist. 11/06/2019 1031 by Amanda Cockayne, LPN Outcome: Progressing 11/06/2019 1031 by Amanda Cockayne, LPN Reactivated Goal: RH STG INCREASE KNOWLEDGE OF STROKE PROPHYLAXIS Description: Patient/caregiver will verbalize understanding of management of stroke prophylaxis including diet, medications, monitoring, exercise, and follow up appointments with min assist. 11/06/2019 1031 by Amanda Cockayne, LPN Outcome: Progressing 11/06/2019 1031 by Amanda Cockayne, LPN Reactivated

## 2019-11-06 NOTE — Progress Notes (Signed)
Speech Language Pathology Discharge Summary  Patient Details  Name: [name] MRN: [id number] Date of Birth: 1951/10/08  Today's Date: 11/06/2019 SLP Individual Time: 0720-0820 SLP Individual Time Calculation (min): 60 min   Skilled Therapeutic Interventions:  Skilled treatment session targeted ongoing cognitive therapy. Pt with planned discharge to home with wife on 11/07/19. Session specifically targeted recall of how to sequence safe transfer utilizing slide board. Pt able to recall use of slide board and recalled need to lock wheelchair prior to using slide board.  SLP also facilitated delayed recall of information presented by rehab MD during initial portion of session.  Given hypothetical anticipatory awareness situations, pt required Min A cues to effectively problem solve each task in sequencial manner.   Patient has met 3 of 3 long term goals.  Patient to discharge at overall Supervision;Min level.  Reasons goals not met:   N/A   Clinical Impression/Discharge Summary:   Pt has made progress over the course of skilled ST and as a result he has met all of his LTGs. He is currently at supervision to [facility] A level of support with higher level cognitive function such as recall of information, awareness of his physical deficits within tasks (dividing attention between tasks and physical deficits) and speech intelligibility. Recommend HHSt to target higher level cognition. All education has been completed.   Care Partner:  Caregiver Able to Provide Assistance: Yes  Type of Caregiver Assistance: Physical;Cognitive  Recommendation:  Home Health SLP  Rationale for SLP Follow Up: Maximize cognitive function and independence;Reduce caregiver burden   Equipment:     Reasons for discharge: Treatment goals met;Discharged from hospital   Patient/Family Agrees with Progress Made and Goals Achieved: Yes    Abree Romick 11/06/2019, 12:19 PM

## 2019-11-06 NOTE — Discharge Instructions (Signed)
[facility]  Discharge Instructions  AV Fistula or Graft Surgery for Dialysis Access  Please refer to the following instructions for your post-procedure care. Your surgeon or physician assistant will discuss any changes with you.  Activity  You may drive the day following your surgery, if you are comfortable and no longer taking prescription pain medication. Resume full activity as the soreness in your incision resolves.  Bathing/Showering  You may shower after you go home. Keep your incision dry for 48 hours. Do not soak in a bathtub, hot tub, or swim until the incision heals completely. You may not shower if you have a hemodialysis catheter.  Incision Care  Clean your incision with mild soap and water after 48 hours. Pat the area dry with a clean towel. You do not need a bandage unless otherwise instructed. Do not apply any ointments or creams to your incision. You may have skin glue on your incision. Do not peel it off. It will come off on its own in about one week. Your arm may swell a bit after surgery. To reduce swelling use pillows to elevate your arm so it is above your heart. Your doctor will tell you if you need to lightly wrap your arm with an ACE bandage.  Diet  Resume your normal diet. There are not special food restrictions following this procedure. In order to heal from your surgery, it is CRITICAL to get adequate nutrition. Your body requires vitamins, minerals, and protein. Vegetables are the best source of vitamins and minerals. Vegetables also provide the perfect balance of protein. Processed food has little nutritional value, so try to avoid this.  Medications  Resume taking all of your medications. If your incision is causing pain, you may take over-the counter pain relievers such as acetaminophen (Tylenol). If you were prescribed a stronger pain medication, please be aware these medications can cause nausea and constipation. Prevent  nausea by taking the medication with a snack or meal. Avoid constipation by drinking plenty of fluids and eating foods with high amount of fiber, such as fruits, vegetables, and grains. Do not take Tylenol if you are taking prescription pain medications.     Follow up Your surgeon may want to see you in the office following your access surgery. If so, this will be arranged at the time of your surgery.  Please call us immediately for any of the following conditions:  Increased pain, redness, drainage (pus) from your incision site Fever of 101 degrees or higher Severe or worsening pain at your incision site Hand pain or numbness.  Reduce your risk of vascular disease:  Stop smoking. If you would like help, call [facility] at 1-800-QUIT-NOW (223)086-0771) or [place] at [place] your cholesterol Maintain a desired weight Control your diabetes Keep your blood pressure down  Dialysis  It will take several weeks to several months for your new dialysis access to be ready for use. Your surgeon will determine when it is OK to use it. Your nephrologist will continue to direct your dialysis. You can continue to use your Permcath until your new access is ready for use.  If you have any questions, please call the office at [phone]. Inpatient Rehab Discharge Instructions  [name] Discharge date and time: No discharge date for patient encounter.   Activities/Precautions/ Functional Status: Activity: activity as tolerated Diet: regular diet Wound Care: keep wound clean and dry Functional status:  ___ No restrictions     ___  Walk up steps independently ___ 24/7 supervision/assistance   ___ Walk up steps with assistance ___ Intermittent supervision/assistance  ___ Bathe/dress independently ___ Walk with walker     _x__ Bathe/dress with assistance ___ Walk Independently    ___ Shower independently ___ Walk with assistance    ___ Shower with assistance ___ No  alcohol     ___ Return to work/school ________    COMMUNITY REFERRALS UPON DISCHARGE:    Home Health:   PT    OT     ST    RN                     Agency:  [facility] Phone:  [phone]   Medical Equipment/Items Ordered: wheelchair, cushion, walker, drop arm commode and transfer board                                                    Agency/Supplier:  [facility] @ [phone]          Special Instructions:  No driving smoking or alcohol  Cleanse sacral wound with normal saline pat gently dry.Flush wound with normal saline prior to packing.  Field defect with iodoform packing strips, top with dry gauze, ABD pads.  Change as needed for soiling otherwise twice daily   STROKE/TIA DISCHARGE INSTRUCTIONS SMOKING Cigarette smoking nearly doubles your risk of having a stroke & is the single most alterable risk factor  If you smoke or have smoked in the last 12 months, you are advised to quit smoking for your health.  Most of the excess cardiovascular risk related to smoking disappears within a year of stopping.  Ask you doctor about anti-smoking medications  [place] Quit Line: 1-800-QUIT NOW  Free Smoking Cessation Classes [phone]  CHOLESTEROL Know your levels; limit fat & cholesterol in your diet  Lipid Panel     Component Value Date/Time   CHOL 134 08/29/2019 0528   CHOL 211 (H) 11/08/2017 1030   TRIG 192 (H) 08/29/2019 0528   HDL 30 (L) 08/29/2019 0528   HDL 46 11/08/2017 1030   CHOLHDL 4.5 08/29/2019 0528   VLDL 38 08/29/2019 0528   LDLCALC 66 08/29/2019 0528   LDLCALC 149 (H) 11/08/2017 1030      Many patients benefit from treatment even if their cholesterol is at goal.  Goal: Total Cholesterol (CHOL) less than 160  Goal:  Triglycerides (TRIG) less than 150  Goal:  HDL greater than 40  Goal:  LDL (LDLCALC) less than 100   BLOOD PRESSURE American Stroke Association blood pressure target is less that 120/80 mm/Hg  Your discharge blood  pressure is:  BP: 135/84  Monitor your blood pressure  Limit your salt and alcohol intake  Many individuals will require more than one medication for high blood pressure  DIABETES (A1c is a blood sugar average for last 3 months) Goal HGBA1c is under 7% (HBGA1c is blood sugar average for last 3 months)  Diabetes: No known diagnosis of diabetes    Lab Results  Component Value Date   HGBA1C 6.1 (H) 07/26/2019     Your HGBA1c can be lowered with medications, healthy diet, and exercise.  Check your blood sugar as directed by your physician  Call your physician if you experience unexplained or low blood sugars.  PHYSICAL ACTIVITY/REHABILITATION Goal is  30 minutes at least 4 days per week  Activity: Increase activity slowly, Therapies: Physical Therapy: Home Health Return to work:   Activity decreases your risk of heart attack and stroke and makes your heart stronger.  It helps control your weight and blood pressure; helps you relax and can improve your mood.  Participate in a regular exercise program.  Talk with your doctor about the best form of exercise for you (dancing, walking, swimming, cycling).  DIET/WEIGHT Goal is to maintain a healthy weight  Your discharge diet is:  Diet Order            Diet regular Room service appropriate? Yes; Fluid consistency: Thin  Diet effective now              liquids Your height is:  Height: 5\' 10"  (177.8 cm) Your current weight is: Weight: 83 kg Your Body Mass Index (BMI) is:  BMI (Calculated): 26.26  Following the type of diet specifically designed for you will help prevent another stroke.  Your goal weight range is:    Your goal Body Mass Index (BMI) is 19-24.  Healthy food habits can help reduce 3 risk factors for stroke:  High cholesterol, hypertension, and excess weight.  RESOURCES Stroke/Support Group:  Call [phone]   STROKE EDUCATION PROVIDED/REVIEWED AND GIVEN TO PATIENT Stroke warning signs and symptoms How to activate  emergency medical system (call 911). Medications prescribed at discharge. Need for follow-up after discharge. Personal risk factors for stroke. Pneumonia vaccine given:  Flu vaccine given:  My questions have been answered, the writing is legible, and I understand these instructions.  I will adhere to these goals & educational materials that have been provided to me after my discharge from the hospital.     My questions have been answered and I understand these instructions. I will adhere to these goals and the provided educational materials after my discharge from the hospital.  Patient/Caregiver Signature _______________________________ Date __________  Clinician Signature _______________________________________ Date __________  Please bring this form and your medication list with you to all your follow-up doctor's appointments.

## 2019-11-06 NOTE — Progress Notes (Signed)
Physical Therapy Discharge Summary  Patient Details  Name: [name] MRN: [id number] Date of Birth: 1951/09/07  Today's Date: 11/06/2019 PT Individual Time: 1300-1410 PT Individual Time Calculation (min): 70 min    Patient has met 8 of 8 long term goals due to improved activity tolerance, improved balance, improved postural control, increased strength, increased range of motion, decreased pain, ability to compensate for deficits, functional use of  left upper extremity and left lower extremity, improved attention, improved awareness and improved coordination.  Patient to discharge at a wheelchair level [place].   Patient's care partner requires assistance to provide the necessary physical assistance at discharge.  Reasons goals not met: All PT goals met.   Recommendation:  Patient will benefit from ongoing skilled PT services in home health setting to continue to advance safe functional mobility, address ongoing impairments in balance, motor planning, transfers, safety, Neuromotor control, gait, bed mobility, and minimize fall risk.  Equipment: WC, SB, RW, Roho cushion.   Reasons for discharge: treatment goals met and discharge from hospital  Patient/family agrees with progress made and goals achieved: Yes   PT treatment:  PT instructed pt in Grad day assessment to measure progress toward goals. See below for details. Pt performed bed mobility in and out of bed on the L side with min assist. SB transfers to and from [facility] with min assist and moderate cues for sequencing when transferring to the L. Sit<>stand from various surfaces with min assist throughout treatment and constant proximal force on the L shoulder for pain management. Car transfer with mod assist after 2 attempts due to poor motor planning and problem solving with transfer to the left to enter car. Gait training with mod assist _+ 2 for WC follow as listed below; noted to have abducted LLE, but able to correct 50% of the  time to improve safety as well as activatee terminal knee extension to advance the RLE.  Pt requesting to return to bed at end of session. Squat pivot transfer with mod assist to the L and moderate cues for safety and set up. Pt left supine in bed with call bell in reach and all needs met.   PT Discharge Precautions/Restrictions   fall Pain   denies Vision/Perception     Mild L sided inattention.  Cognition Overall Cognitive Status: Impaired/Different from baseline Arousal/Alertness: Awake/alert Orientation Level: Oriented X4 Attention: Selective Selective Attention: Impaired Selective Attention Impairment: Verbal complex;Functional complex Memory: Impaired Memory Impairment: Decreased short term memory Decreased Short Term Memory: Verbal complex;Functional complex Awareness: Impaired Awareness Impairment: Anticipatory impairment Problem Solving: Impaired Problem Solving Impairment: Verbal complex;Functional complex Safety/Judgment: Impaired Sensation Sensation Light Touch: Impaired Detail Light Touch Impaired Details: Impaired LUE;Impaired LLE Proprioception: Impaired Detail Proprioception Impaired Details: Impaired LUE;Impaired LLE Coordination Gross Motor Movements are Fluid and Coordinated: No Fine Motor Movements are Fluid and Coordinated: No Heel Shin Test: WFL on the R. ataxic on the R Motor  Motor Motor: Clonus;Hemiplegia;Motor apraxia;Abnormal tone;Abnormal postural alignment and control;Motor perseverations Motor - Discharge Observations: L hemiplegia  Mobility Bed Mobility Bed Mobility: Rolling Right;Rolling Left;Sit to Supine;Supine to Sit Rolling Right: Minimal Assistance - Patient > 75% Rolling Left: Minimal Assistance - Patient > 75% Supine to Sit: Minimal Assistance - Patient > 75% Sit to Supine: Minimal Assistance - Patient > 75% Transfers Transfers: Transfer;Sit to [facility];Lateral/Scoot Transfers Sit to Stand: Minimal Assistance -  Patient > 75% Stand Pivot Transfers: Moderate Assistance - Patient 50 - 74% Stand Pivot Transfer Details: Verbal cues for  technique;Verbal cues for precautions/safety;Verbal cues for sequencing;Verbal cues for safe use of DME/AE;Verbal cues for gait pattern Stand Pivot Transfer Details (indicate cue type and reason): with RW Lateral/Scoot Transfers: Minimal Assistance - Patient > 75% Transfer (Assistive device): Other (Comment)(slide board) Transfer via Lift Equipment: Contractor) Locomotion  Gait Ambulation: Yes Gait Assistance: 2 Holiday representative (Feet): 10 Feet Assistive device: Rolling walker Gait Assistance Details: Verbal cues for gait pattern;Verbal cues for sequencing;Verbal cues for safe use of DME/AE;Verbal cues for technique;Manual facilitation for weight shifting;Manual facilitation for placement;Verbal cues for precautions/safety Gait Gait: Yes Gait Pattern: Impaired Gait Pattern: Step-to pattern;Abducted - left;Lateral trunk lean to left;Lateral hip instability;Ataxic;Decreased step length - right Stairs / Additional Locomotion Stairs: No Architect: Yes Wheelchair Assistance: Chartered loss adjuster: Right upper extremity;Right lower extremity Wheelchair Parts Management: Needs assistance Distance: 150  Trunk/Postural Assessment  Cervical Assessment Cervical Assessment: Exceptions to [facility] Thoracic Assessment Thoracic Assessment: Exceptions to WFL(rounded shoulders) Lumbar Assessment Lumbar Assessment: Exceptions to WFL(posterier pelvic tilt) Postural Control Postural Limitations: requires UE assist to prevent LOB to the L  Balance Balance Balance Assessed: Yes Static Sitting Balance Static Sitting - Balance Support: Feet supported Static Sitting - Level of Assistance: 5: Stand by assistance Dynamic Sitting Balance Dynamic Sitting - Balance Support: Feet supported Dynamic Sitting - Level of Assistance:  5: Stand by assistance Static Standing Balance Static Standing - Balance Support: Bilateral upper extremity supported Static Standing - Level of Assistance: 4: Min assist Dynamic Standing Balance Dynamic Standing - Balance Support: Bilateral upper extremity supported Dynamic Standing - Level of Assistance: 3: Mod assist Extremity Assessment      RLE Assessment RLE Assessment: Exceptions to [facility] General Strength Comments: grossly 4-/5 to 4/5 proximal to distal LLE Assessment LLE Assessment: Exceptions to [facility] General Strength Comments: grossly 3/5 to 4-/5 with ataxia    [name] [name] 11/06/2019, 6:03 PM

## 2019-11-06 NOTE — Progress Notes (Signed)
Pt up until about 230 am despite taking sleep medication. Seems excited about upcoming discharge. Dressing changed approx 2am. Spouse to come in for wound care teaching today. Supplies ordered and at bedside

## 2019-11-06 NOTE — Progress Notes (Signed)
[facility] PHYSICAL MEDICINE & REHABILITATION PROGRESS NOTE   Subjective/Complaints:      ROS: Patient denies CP, SOB, N/V/D  Objective:   No results found. No results for input(s): WBC, HGB, HCT, PLT in the last 72 hours. Recent Labs    11/04/19 1059 11/06/19 0506  NA 142 144  K 3.3* 4.0  CL 111 111  CO2 24 27  GLUCOSE 97 94  BUN 23 23  CREATININE 2.98* 2.77*  CALCIUM 10.0 10.0    Intake/Output Summary (Last 24 hours) at 11/06/2019 0745 Last data filed at 11/06/2019 0531 Gross per 24 hour  Intake 680 ml  Output 500 ml  Net 180 ml     Physical Exam: Vital Signs Blood pressure 139/88, pulse 94, temperature 98.2 F (36.8 C), resp. rate 16, height 5\' 10"  (1.778 m), weight 81 kg, SpO2 98 %. Constitutional: No distress . Labs and Vital signs reviewed. HEENT: EOMI, oral membranes moist Neck: supple Cardiovascular: tachy 100-110 without murmur. No JVD    Respiratory: CTA Bilaterally without wheezes or rales. Normal effort    GI: BS +, non-tender, non-distended  Skin: Warm and dry.  Intact. Has sacral draining sinus- 6" aquacel pack, no surrounding erythema Left upper extremity sutures intact no drainage.  No erythema Psych: extremely pleasant Musc: No edema in extremities.  No tenderness in extremities. Neuro: Alert Dysphonia, unchanged  Motor: RUE/RLE: 5/5 proximal distal LUE: Shoulder abduction, elbow flexion/extension 4-/5, handgrip 2/5 LLE: Hip flexion, knee extension 4-/5, ankle dorsiflexion 0/5-- Extremities: Left arm edema mainly above the elbow. Assessment/Plan: 1. Functional deficits secondary to Left hemiparesis from RIght parietal infarct   which require 3+ hours per day of interdisciplinary therapy in a comprehensive inpatient rehab setting.  Physiatrist is providing close team supervision and 24 hour management of active medical problems listed below.  Physiatrist and rehab team continue to assess barriers to discharge/monitor patient progress toward  functional and medical goals  Care Tool:  Bathing    Body parts bathed by patient: Chest, Abdomen, Front perineal area, Right upper leg, Left upper leg, Face, Left arm, Right lower leg, Left lower leg, Buttocks   Body parts bathed by helper: Right arm     Bathing assist Assist Level: Minimal Assistance - Patient > 75%     Upper Body Dressing/Undressing Upper body dressing   What is the patient wearing?: Pull over shirt    Upper body assist Assist Level: Moderate Assistance - Patient 50 - 74%    Lower Body Dressing/Undressing Lower body dressing      What is the patient wearing?: Pants, Incontinence brief     Lower body assist Assist for lower body dressing: Maximal Assistance - Patient 25 - 49%     Toileting Toileting Toileting Activity did not occur (Clothing management and hygiene only): N/A (no void or bm)  Toileting assist Assist for toileting: Maximal Assistance - Patient 25 - 49% Assistive Device Comment: urinal    Transfers Chair/bed transfer  Transfers assist  Chair/bed transfer activity did not occur: Safety/medical concerns  Chair/bed transfer assist level: Moderate Assistance - Patient 50 - 74%     Locomotion Ambulation   Ambulation assist   Ambulation activity did not occur: Safety/medical concerns  Assist level: 2 helpers Assistive device: Walker-rolling Max distance: 4   Walk 10 feet activity   Assist  Walk 10 feet activity did not occur: Safety/medical concerns        Walk 50 feet activity   Assist Walk 50 feet with 2  turns activity did not occur: Safety/medical concerns         Walk 150 feet activity   Assist Walk 150 feet activity did not occur: Safety/medical concerns         Walk 10 feet on uneven surface  activity   Assist Walk 10 feet on uneven surfaces activity did not occur: Safety/medical concerns         Wheelchair     Assist   Type of Wheelchair: Manual    Wheelchair assist level:  Supervision/Verbal cueing Max wheelchair distance: 100    Wheelchair 50 feet with 2 turns activity    Assist        Assist Level: Supervision/Verbal cueing   Wheelchair 150 feet activity     Assist      Assist Level: Supervision/Verbal cueing   Blood pressure 139/88, pulse 94, temperature 98.2 F (36.8 C), resp. rate 16, height 5\' 10"  (1.778 m), weight 81 kg, SpO2 98 %.  Medical Problem List and Plan: 1.  Debility secondary to acute hypoxemic RIght parietal infarct with Left hemiparesis LE>UE   Continue CIR- PT, OT, SLP ELOS 1/15  2.  Antithrombotics: -DVT/anticoagulation: Subcutaneous heparin. Negative  vascular study             -antiplatelet therapy: Aspirin 81 mg daily 3. Pain Management: Hycet as needed,some sacral pain but not severe    1/12 4. Mood: Provide emotional support. Rozerem 8 mg nightly             -antipsychotic agents: N/A 5. Neuropsych: This patient is capable of making decisions on his own behalf.          6. Skin/Wound Care: Wound care sacral decubitus with tunneling. , cont mattress overlay, sidelying recommended - family training for wound care today   -pack Aquacel Ag+ twice daily RN needs to teach wife about packing      -nutrition, turning, prevalon boots for heels   7. Fluids/Electrolytes/Nutrition: Routine in and outs 8.  Anemia/melena.  Continue Aranesp  Hgb 8.6 on 1/4 9.  Tracheostomy 08/22/2019.  Decannulated.  Diet advanced to regular.  Continue nebulizers as directed 10.  MSSA bacteremia/pneumonia.  Antibiotic therapy completed 11.  Hyperlipidemia.  Lipitor 12.  Post stroke oropharyngeal dysphagia: Appreciate speech therapy recommendations; regular diet with oral care BID  -MMW for thrush 13.  Renal failure after sepsis-resolveing per nephro no need for HD Creat stable at 314.  Infected sacral decub- no I and D per gen surg   -afebrile  No leukocytosis  - completed abx    Continue to monitor 15.  Sleep disturbance no current  c/os  Melatonin started on 12/20 16. Constipation:   17. Hypokalemia. 1/11, 3.1 received 55meq KCL x 2 yesterday . Repeat K 3.3 will repeat  KCL 71meq BID x 2 d, repeat K+ 4.0 , recheck in am   #18.  AV graft placed on the left side in the upper extremity when long-term hemodialysis appeared imminent.  Fortunately renal status has subsequently improved LOS: 34 days A FACE TO [place] E Tanea Moga 11/06/2019, 7:45 AM

## 2019-11-07 ENCOUNTER — Other Ambulatory Visit: Payer: Self-pay

## 2019-11-07 LAB — BASIC METABOLIC PANEL
Anion gap: 9 (ref 5–15)
BUN: 23 mg/dL (ref 8–23)
CO2: 24 mmol/L (ref 22–32)
Calcium: 10.2 mg/dL (ref 8.9–10.3)
Chloride: 108 mmol/L (ref 98–111)
Creatinine, Ser: 2.67 mg/dL — ABNORMAL HIGH (ref 0.61–1.24)
GFR calc Af Amer: 27 mL/min — ABNORMAL LOW (ref >60)
GFR calc non Af Amer: 24 mL/min — ABNORMAL LOW (ref >60)
Glucose, Bld: 90 mg/dL (ref 70–99)
Potassium: 3.8 mmol/L (ref 3.5–5.1)
Sodium: 141 mmol/L (ref 135–145)

## 2019-11-07 NOTE — Plan of Care (Signed)
  Problem: Consults Goal: RH STROKE PATIENT EDUCATION Description: See Patient Education module for education specifics  11/07/2019 1114 by Amanda Cockayne, LPN Outcome: Completed/Met 11/07/2019 1114 by Amanda Cockayne, LPN Reactivated Goal: Nutrition Consult-if indicated 11/07/2019 1114 by Amanda Cockayne, LPN Outcome: Completed/Met 11/07/2019 1114 by Amanda Cockayne, LPN Reactivated   Problem: RH BOWEL ELIMINATION Goal: RH STG MANAGE BOWEL WITH ASSISTANCE Description: STG Manage Bowel with min/mod Assistance. 11/07/2019 1114 by Amanda Cockayne, LPN Outcome: Completed/Met 11/07/2019 1114 by Amanda Cockayne, LPN Reactivated Goal: RH STG MANAGE BOWEL W/MEDICATION W/ASSISTANCE Description: STG Manage Bowel with Medication with mod I Assistance. 11/07/2019 1114 by Toy Cookey F, LPN Outcome: Completed/Met 11/07/2019 1114 by Amanda Cockayne, LPN Reactivated   Problem: RH BLADDER ELIMINATION Goal: RH STG MANAGE BLADDER WITH ASSISTANCE Description: STG Manage Bladder With min/mod Assistance 11/07/2019 1114 by Amanda Cockayne, LPN Outcome: Completed/Met 11/07/2019 1114 by Amanda Cockayne, LPN Reactivated   Problem: RH SKIN INTEGRITY Goal: RH STG SKIN FREE OF INFECTION/BREAKDOWN Description: Patients skin will remain free from further infection or breakdown with mod assist. 11/07/2019 1114 by Amanda Cockayne, LPN Outcome: Completed/Met 11/07/2019 1114 by Amanda Cockayne, LPN Reactivated Goal: RH STG MAINTAIN SKIN INTEGRITY WITH ASSISTANCE Description: STG Maintain Skin Integrity With mod Assistance. 11/07/2019 1114 by Toy Cookey F, LPN Outcome: Completed/Met 11/07/2019 1114 by Amanda Cockayne, LPN Reactivated Goal: RH STG ABLE TO PERFORM INCISION/WOUND CARE W/ASSISTANCE Description: STG Able To Perform Incision/Wound Care With total Assistance from caregiver. 11/07/2019 1114 by Toy Cookey F, LPN Outcome: Completed/Met 11/07/2019 1114 by Amanda Cockayne, LPN Reactivated   Problem: RH SAFETY Goal: RH STG ADHERE TO SAFETY PRECAUTIONS W/ASSISTANCE/DEVICE Description: STG Adhere to Safety Precautions With supervision Assistance/Device. 11/07/2019 1114 by Toy Cookey F, LPN Outcome: Completed/Met 11/07/2019 1114 by Amanda Cockayne, LPN Reactivated   Problem: RH PAIN MANAGEMENT Goal: RH STG PAIN MANAGED AT OR BELOW PT'S PAIN GOAL Description: < 4 11/07/2019 1114 by Amanda Cockayne, LPN Outcome: Completed/Met 11/07/2019 1114 by Amanda Cockayne, LPN Reactivated   Problem: RH KNOWLEDGE DEFICIT Goal: RH STG INCREASE KNOWLEDGE OF HYPERTENSION Description: Patient/caregiver will verbalize understanding of management of HTN including diet, medications, monitoring, exercise, and follow up appointments with min assist. 11/07/2019 1114 by Amanda Cockayne, LPN Outcome: Completed/Met 11/07/2019 1114 by Amanda Cockayne, LPN Reactivated Goal: RH STG INCREASE KNOWLEGDE OF HYPERLIPIDEMIA Description: Patient/caregiver will verbalize understanding of management of HLD including diet, medications, monitoring, exercise, and follow up appointments with min assist. 11/07/2019 1114 by Amanda Cockayne, LPN Outcome: Completed/Met 11/07/2019 1114 by Amanda Cockayne, LPN Reactivated Goal: RH STG INCREASE KNOWLEDGE OF STROKE PROPHYLAXIS Description: Patient/caregiver will verbalize understanding of management of stroke prophylaxis including diet, medications, monitoring, exercise, and follow up appointments with min assist. 11/07/2019 1114 by Amanda Cockayne, LPN Outcome: Completed/Met 11/07/2019 1114 by Amanda Cockayne, LPN Reactivated

## 2019-11-07 NOTE — Progress Notes (Signed)
Patient refused all AM medications. Pt stated "I've had enough medications, I am ok". Amanda Cockayne, LPN

## 2019-11-07 NOTE — Progress Notes (Signed)
Pt was given D/C instructions by Linna Hoff, PA. Pt was accompanied by wife this morning. All belongings were packed and sent home with pt. No complaints or concerns to report at this time. Amanda Cockayne, LPN

## 2019-11-07 NOTE — Consult Note (Signed)
Post inpatient CIR follow up for [facility] Member in [place]. Assign to EMMI Pneumonia.  Natividad Brood, RN BSN [facility] Liaison  [phone] business mobile phone Toll free office [phone]  Fax number: [phone] [email or web address]

## 2019-11-07 NOTE — Progress Notes (Signed)
[facility] PHYSICAL MEDICINE & REHABILITATION PROGRESS NOTE   Subjective/Complaints:   Feels ready to go home   ROS: Patient denies CP, SOB, N/V/D  Objective:   No results found. No results for input(s): WBC, HGB, HCT, PLT in the last 72 hours. Recent Labs    11/06/19 0506 11/07/19 0448  NA 144 141  K 4.0 3.8  CL 111 108  CO2 27 24  GLUCOSE 94 90  BUN 23 23  CREATININE 2.77* 2.67*  CALCIUM 10.0 10.2    Intake/Output Summary (Last 24 hours) at 11/07/2019 0857 Last data filed at 11/07/2019 0824 Gross per 24 hour  Intake 870 ml  Output 1100 ml  Net -230 ml     Physical Exam: Vital Signs Blood pressure (!) 146/88, pulse 88, temperature 98.8 F (37.1 C), resp. rate 16, height 5\' 10"  (1.778 m), weight 81 kg, SpO2 100 %. Constitutional: No distress . Labs and Vital signs reviewed. HEENT: EOMI, oral membranes moist Neck: supple Cardiovascular: tachy 100-110 without murmur. No JVD    Respiratory: CTA Bilaterally without wheezes or rales. Normal effort    GI: BS +, non-tender, non-distended  Skin: Warm and dry.  Intact. Has sacral draining sinus- 6" aquacel pack, no surrounding erythema Left upper extremity sutures intact no drainage.  No erythema Psych: extremely pleasant Musc: No edema in extremities.  No tenderness in extremities. Neuro: Alert Dysphonia, unchanged  Motor: RUE/RLE: 5/5 proximal distal LUE: Shoulder abduction, elbow flexion/extension 4-/5, handgrip 2/5 LLE: Hip flexion, knee extension 4-/5, ankle dorsiflexion 0/5-- Extremities: Left arm edema mainly above the elbow. Assessment/Plan: 1. Functional deficits secondary to Left hemiparesis from RIght parietal infarct  Stable for D/C today F/u PCP in 3-4 weeks F/u PM&R 2 weeks See D/C summary See D/C instructions Care Tool:  Bathing    Body parts bathed by patient: Chest, Abdomen, Front perineal area, Right upper leg, Left upper leg, Face, Left arm, Right lower leg, Left lower leg, Buttocks   Body  parts bathed by helper: Right arm     Bathing assist Assist Level: Minimal Assistance - Patient > 75%     Upper Body Dressing/Undressing Upper body dressing   What is the patient wearing?: Pull over shirt    Upper body assist Assist Level: Moderate Assistance - Patient 50 - 74%    Lower Body Dressing/Undressing Lower body dressing      What is the patient wearing?: Pants     Lower body assist Assist for lower body dressing: Moderate Assistance - Patient 50 - 74%     Toileting Toileting Toileting Activity did not occur Landscape architect and hygiene only): N/A (no void or bm)  Toileting assist Assist for toileting: Maximal Assistance - Patient 25 - 49% Assistive Device Comment: urinal    Transfers Chair/bed transfer  Transfers assist  Chair/bed transfer activity did not occur: Safety/medical concerns  Chair/bed transfer assist level: Minimal Assistance - Patient > 75%     Locomotion Ambulation   Ambulation assist   Ambulation activity did not occur: Safety/medical concerns  Assist level: 2 helpers Assistive device: Walker-rolling Max distance: 10   Walk 10 feet activity   Assist  Walk 10 feet activity did not occur: Safety/medical concerns  Assist level: 2 helpers Assistive device: Walker-rolling   Walk 50 feet activity   Assist Walk 50 feet with 2 turns activity did not occur: Safety/medical concerns         Walk 150 feet activity   Assist Walk 150 feet activity did not  occur: Safety/medical concerns         Walk 10 feet on uneven surface  activity   Assist Walk 10 feet on uneven surfaces activity did not occur: Safety/medical concerns         Wheelchair     Assist Will patient use wheelchair at discharge?: Yes Type of Wheelchair: Manual    Wheelchair assist level: Supervision/Verbal cueing Max wheelchair distance: 150    Wheelchair 50 feet with 2 turns activity    Assist        Assist Level:  Supervision/Verbal cueing   Wheelchair 150 feet activity     Assist      Assist Level: Supervision/Verbal cueing   Blood pressure (!) 146/88, pulse 88, temperature 98.8 F (37.1 C), resp. rate 16, height 5\' 10"  (1.778 m), weight 81 kg, SpO2 100 %.  Medical Problem List and Plan: 1.  Debility secondary to acute hypoxemic RIght parietal infarct with Left hemiparesis LE>UE   D/C home today   2.  Antithrombotics: -DVT/anticoagulation: Subcutaneous heparin. Negative  vascular study             -antiplatelet therapy: Aspirin 81 mg daily 3. Pain Management: Hycet as needed,some sacral pain but not severe    1/12 4. Mood: Provide emotional support. Rozerem 8 mg nightly             -antipsychotic agents: N/A 5. Neuropsych: This patient is capable of making decisions on his own behalf.          6. Skin/Wound Care: Wound care sacral decubitus with tunneling. , cont mattress overlay, sidelying recommended - family training for wound care today   -pack Aquacel Ag+ twice daily RN needs to teach wife about packing      -nutrition, turning, prevalon boots for heels   7. Fluids/Electrolytes/Nutrition: Routine in and outs 8.  Anemia/melena.  Continue Aranesp  Hgb 8.6 on 1/4 9.  Tracheostomy 08/22/2019.  Decannulated.  Diet advanced to regular.  Continue nebulizers as directed 10.  MSSA bacteremia/pneumonia.  Antibiotic therapy completed 11.  Hyperlipidemia.  Lipitor 12.  Post stroke oropharyngeal dysphagia: Appreciate speech therapy recommendations; regular diet with oral care BID  -MMW for thrush 13.  Renal failure after sepsis-resolveing per nephro no need for HD Creat stable at 314.  Infected sacral decub- no I and D per gen surg   -afebrile  No leukocytosis  - completed abx    Continue to monitor 15.  Sleep disturbance no current c/os  Melatonin started on 12/20 16. Constipation:   17. Hypokalemia. 1/11, 3.1 received 59meq KCL x 2 yesterday . Repeat K 3.3 will repeat  KCL 28meq BID  x 2 d, repeat K+ 4.0 , recheck 3.8 will need to supplement daily and repeat BMET in 7 d per [facility] with result to PCP   #18.  AV graft placed on the left side in the upper extremity when long-term hemodialysis appeared imminent.  Fortunately renal status has subsequently improved LOS: 35 days A FACE TO [place] E Humza [place] 11/07/2019, 8:57 AM

## 2019-11-08 NOTE — Progress Notes (Signed)
Social Work Discharge Note   The overall goal for the admission was met for:   Discharge location: Yes - home with wife and adult children providing 24/7 assistance  Length of Stay: Yes - 35 days  Discharge activity level: Yes - minimal assistance at w/c level  Home/community participation: Yes  Services provided included: MD, RD, PT, OT, SLP, RN, TR, Pharmacy, [place]: [employer]  Follow-up services arranged: Home Health: RN, PT, OT , ST via [facility], DME: 20x18 lightweight w/c, Roho cushion, rolling walker, drop arm commode and 30" transfer board via [facility] and Patient/Family has no preference for HH/DME agencies  Comments (or additional information):     Contact info, wife, Zayd Bonet @ [phone] or [phone]  Patient/Family verbalized understanding of follow-up arrangements: Yes  Individual responsible for coordination of the follow-up plan: pt/wife  Confirmed correct DME delivered: Lennart Pall 11/08/2019    Shaylie Eklund

## 2019-11-10 ENCOUNTER — Telehealth: Payer: Self-pay

## 2019-11-10 DIAGNOSIS — I69391 Dysphagia following cerebral infarction: Secondary | ICD-10-CM | POA: Diagnosis not present

## 2019-11-10 DIAGNOSIS — K5900 Constipation, unspecified: Secondary | ICD-10-CM | POA: Diagnosis not present

## 2019-11-10 DIAGNOSIS — Z8616 Personal history of COVID-19: Secondary | ICD-10-CM | POA: Diagnosis not present

## 2019-11-10 DIAGNOSIS — L89154 Pressure ulcer of sacral region, stage 4: Secondary | ICD-10-CM | POA: Diagnosis not present

## 2019-11-10 DIAGNOSIS — R32 Unspecified urinary incontinence: Secondary | ICD-10-CM | POA: Diagnosis not present

## 2019-11-10 DIAGNOSIS — K219 Gastro-esophageal reflux disease without esophagitis: Secondary | ICD-10-CM | POA: Diagnosis not present

## 2019-11-10 DIAGNOSIS — R1312 Dysphagia, oropharyngeal phase: Secondary | ICD-10-CM | POA: Diagnosis not present

## 2019-11-10 DIAGNOSIS — I69354 Hemiplegia and hemiparesis following cerebral infarction affecting left non-dominant side: Secondary | ICD-10-CM | POA: Diagnosis not present

## 2019-11-10 DIAGNOSIS — D649 Anemia, unspecified: Secondary | ICD-10-CM | POA: Diagnosis not present

## 2019-11-10 NOTE — Telephone Encounter (Signed)
Transitional Care call--wife [name] and patient   1. Are you/is patient experiencing any problems since coming home? No Are there any questions regarding any aspect of care? No 2. Are there any questions regarding medications administration/dosing? Lidocaine patches too expensive, insurance want pay for. Advised wife to get Elwin Mocha and try those, she agreed. Are meds being taken as prescribed? Yes Patient should review meds with caller to confirm 3. Have there been any falls? No 4. Has Home Health been to the house and/or have they contacted you? Yes  If not, have you tried to contact them? Can we help you contact them? 5. Are bowels and bladder emptying properly? Yes Are there any unexpected incontinence issues? No If applicable, is patient following bowel/bladder programs? 6. Any fevers, problems with breathing, unexpected pain? No 7. Are there any skin problems or new areas of breakdown? No 8. Has the patient/family member arranged specialty MD follow up (ie cardiology/neurology/renal/surgical/etc)? One office called to with appt. Advised to call others to get an appt Can we help arrange? 9. Does the patient need any other services or support that we can help arrange? No 10. Are caregivers following through as expected in assisting the patient? yes 11. Has the patient quit smoking, drinking alcohol, or using drugs as recommended? yes  Appointment time 1:15 pm arrive time 1:00 pm with Dr. Letta Pate on 11/21/2019 [address]

## 2019-11-12 DIAGNOSIS — R1312 Dysphagia, oropharyngeal phase: Secondary | ICD-10-CM | POA: Diagnosis not present

## 2019-11-12 DIAGNOSIS — Z8616 Personal history of COVID-19: Secondary | ICD-10-CM | POA: Diagnosis not present

## 2019-11-12 DIAGNOSIS — I69354 Hemiplegia and hemiparesis following cerebral infarction affecting left non-dominant side: Secondary | ICD-10-CM | POA: Diagnosis not present

## 2019-11-12 DIAGNOSIS — I69391 Dysphagia following cerebral infarction: Secondary | ICD-10-CM | POA: Diagnosis not present

## 2019-11-12 DIAGNOSIS — D649 Anemia, unspecified: Secondary | ICD-10-CM | POA: Diagnosis not present

## 2019-11-12 DIAGNOSIS — K219 Gastro-esophageal reflux disease without esophagitis: Secondary | ICD-10-CM | POA: Diagnosis not present

## 2019-11-12 DIAGNOSIS — L89154 Pressure ulcer of sacral region, stage 4: Secondary | ICD-10-CM | POA: Diagnosis not present

## 2019-11-12 DIAGNOSIS — R32 Unspecified urinary incontinence: Secondary | ICD-10-CM | POA: Diagnosis not present

## 2019-11-12 DIAGNOSIS — K5900 Constipation, unspecified: Secondary | ICD-10-CM | POA: Diagnosis not present

## 2019-11-13 ENCOUNTER — Other Ambulatory Visit: Payer: Self-pay

## 2019-11-13 ENCOUNTER — Ambulatory Visit: Payer: Medicare HMO | Admitting: Family Medicine

## 2019-11-13 DIAGNOSIS — N289 Disorder of kidney and ureter, unspecified: Secondary | ICD-10-CM

## 2019-11-13 DIAGNOSIS — Z8673 Personal history of transient ischemic attack (TIA), and cerebral infarction without residual deficits: Secondary | ICD-10-CM

## 2019-11-13 DIAGNOSIS — L89152 Pressure ulcer of sacral region, stage 2: Secondary | ICD-10-CM

## 2019-11-13 NOTE — Patient Outreach (Signed)
[facility]) Care Management  11/13/2019  [name] [birth year] [id number]   EMMI- Pneumonia RED ON EMMI ALERT Day # 2 Date: 11/12/19 Red Alert Reason:  Been confused? Yes  Outreach attempt:  Spoke with patient . He reports he is home and adjusting.  Addressed red alert.  He states that he has some confusion in the morning but it clears.  Discussed COVID-19 and reports of confusion with recovery.  Encouraged patient to hang in there and that recovery takes time.  He has the support of his wife. He has all medications and transportation to appointments.      Plan: RN CM will close case.    Jone Baseman, RN, MSN [facility] Care Management Care Management Coordinator Direct Line [phone] Toll Free: [phone]  Fax: [phone]

## 2019-11-13 NOTE — Progress Notes (Signed)
Subjective:    Patient ID: [name], male    DOB: [birth year], 69 y.o.   MRN: [id number]  HPI This patient was seen as a home visit He was recently discharged from the hospital after a long stay for Covid as well as he had a stroke while he was in the hospital plus also had kidney damage he was on dialysis for several weeks he was on the ventilator for over 6 weeks he also had a chest tube as well as a sacral decubiti ulcer he is actually started doing better his kidneys are functioning better he is no longer on dialysis he is no longer on a ventilator he does not have a tracheostomy and his voice is weak from previous breathing issues The patient also had a severe stroke while in the hospital left side of his body is not working properly he has clumsiness in the left hand with weakness in the left leg he is able to walk with maximal assistance and he needs physical therapy Face-to-face evaluation He would benefit from home health as well to help manage his sacral decubiti   Review of Systems  Constitutional: Positive for fatigue. Negative for diaphoresis.  HENT: Negative for congestion and rhinorrhea.   Respiratory: Negative for cough and shortness of breath.   Cardiovascular: Negative for chest pain and leg swelling.  Gastrointestinal: Negative for abdominal pain and diarrhea.  Skin: Negative for color change and rash.  Neurological: Positive for tremors and weakness. Negative for dizziness and headaches.  Psychiatric/Behavioral: Negative for behavioral problems and confusion.       Objective:   Physical Exam Vitals reviewed.  Cardiovascular:     Rate and Rhythm: Normal rate and regular rhythm.     Heart sounds: Normal heart sounds. No murmur.  Pulmonary:     Effort: Pulmonary effort is normal.     Breath sounds: Normal breath sounds.  Lymphadenopathy:     Cervical: No cervical adenopathy.  Neurological:     Mental Status: He is alert.  Psychiatric:        Behavior:  Behavior normal.    Patient has good strength on the right side left side he has clonus in the left hand with a tremor he has a weak grip he has some mild upper arm strength 3+/5 his grip is 3/5 his left leg is 2/5 with definitive weakness in the foot as well patient is disabled from his issues He is essentially bed and wheelchair bound.  He can do minimal walking with maximal assistance.  Minimal transfer with maximal assistance.  Patient also has a sacral decubiti that is healing  40 minutes spent with family with this issue     Assessment & Plan:  Home visit Patient has had a stroke during his Covid This had a severe problem for him in regards to left-sided weakness.  He has clonus activity in his left hand as well as significant weakness in the left arm and left leg. He is wheelchair-bound.  But does get up with assistance.  He also has of the sacral decubiti which will gradually heal. We will see if there is any additional cushion used for him  His kidneys are gradually improving but being followed by nephrology he has a follow-up appointment with them in February  Face-to-face-patient would benefit from home health on a regular basis.  Patient would also benefit from physical therapy on a regular basis.  From a mental health standpoint patient is maintaining good health.  We will follow-up in with him in approximately 8 weeks sooner if any problem

## 2019-11-14 ENCOUNTER — Other Ambulatory Visit: Payer: Self-pay

## 2019-11-14 NOTE — Patient Outreach (Signed)
[facility]) Care Management  11/14/2019  [name] [birth year] [id number]   EMMI- Pneumonia RED ON EMMI ALERT Day # 3 Date:  11/13/19 Red Alert Reason:  Been to follow up appointment? No  EMMI red alert.  No need for outreach.  Patient had PCP appointment on 11/13/19  Plan: RN CM will close case.   Jone Baseman, RN, MSN [facility] Care Management Care Management Coordinator Direct Line [phone] Toll Free: [phone]  Fax: [phone]

## 2019-11-17 ENCOUNTER — Other Ambulatory Visit: Payer: Self-pay

## 2019-11-17 NOTE — Progress Notes (Signed)
Called pt's wife [name] and got name of the home health agency. She states the [facility] is [facility] and his nurses name is Leda Gauze. Her number is [phone]. I called marilyn and had to leave a message on her voicemail to return call.

## 2019-11-17 NOTE — Patient Outreach (Addendum)
[facility]) Care Management  11/17/2019  Indy Kuck [birth year] [id number]   EMMI- pneumonia RED ON EMMI ALERT Day # 4 Date: 11/14/19 Red Alert Reason: Feeling better overall? No  Day #6 11/16/19 Red alert Reason: Swelling in hands/feet or changes in weight? yes  Outreach attempt: spoke with patient.  He states he is doing good.  Addressed red alerts. Patient reports that was a mistake.  He acknowledged going to the physician and no questions about MD visit.  He did states that he was waiting for therapy to get back with them with visits.  He states the evaluation has been done last week.  Advised patient that therapy normally calls the day prior to visit or the day of visit.  He verbalized understanding and stated he just like to know of visit ahead of time to plan. Encouraged patient to discuss with his therapist on next visit.  He verbalized understanding and declines further nurse follow up.     Plan: RN CM will close case.     Jone Baseman, RN, MSN [facility] Care Management Care Management Coordinator Direct Line [phone] Toll Free: [phone]  Fax: [phone]

## 2019-11-18 ENCOUNTER — Telehealth: Payer: Self-pay | Admitting: *Deleted

## 2019-11-18 NOTE — Telephone Encounter (Signed)
Leda Gauze nurse from home health called back and states she will check about getting pt a pillow for when he is sitting in his chair to reduce further sacral ulcers. She also wanted to clarify an order. She states wife told her dr Nicki Reaper wanted to do wound care twice a day and marilyn states that is fine if that what he wants but usually wound care is once a day due to risk of infection each time they change it. Marilyn's number is [phone]

## 2019-11-19 DIAGNOSIS — R32 Unspecified urinary incontinence: Secondary | ICD-10-CM | POA: Diagnosis not present

## 2019-11-19 DIAGNOSIS — K219 Gastro-esophageal reflux disease without esophagitis: Secondary | ICD-10-CM | POA: Diagnosis not present

## 2019-11-19 DIAGNOSIS — D649 Anemia, unspecified: Secondary | ICD-10-CM | POA: Diagnosis not present

## 2019-11-19 DIAGNOSIS — Z8616 Personal history of COVID-19: Secondary | ICD-10-CM | POA: Diagnosis not present

## 2019-11-19 DIAGNOSIS — K5900 Constipation, unspecified: Secondary | ICD-10-CM | POA: Diagnosis not present

## 2019-11-19 DIAGNOSIS — I69391 Dysphagia following cerebral infarction: Secondary | ICD-10-CM | POA: Diagnosis not present

## 2019-11-19 DIAGNOSIS — R1312 Dysphagia, oropharyngeal phase: Secondary | ICD-10-CM | POA: Diagnosis not present

## 2019-11-19 DIAGNOSIS — L89154 Pressure ulcer of sacral region, stage 4: Secondary | ICD-10-CM | POA: Diagnosis not present

## 2019-11-19 DIAGNOSIS — I69354 Hemiplegia and hemiparesis following cerebral infarction affecting left non-dominant side: Secondary | ICD-10-CM | POA: Diagnosis not present

## 2019-11-19 NOTE — Telephone Encounter (Signed)
Spoke with patient regarding Protonix and informed him that per provider he could hold off on med for at next 7 days. Pt verbalized understanding.  Spoke with Leda Gauze (nurse) and let her know that per provider once daily wound care is adequate. Leda Gauze states she spoke with [employer] and they state even with a prescription the sacral cushion would not be covered, family will need to pay for it. Leda Gauze states she will call the family and let them know.

## 2019-11-19 NOTE — Progress Notes (Signed)
Please see pt call from yesterday and today.

## 2019-11-19 NOTE — Telephone Encounter (Signed)
1.-It would be perfectly fine for him to hold off on Protonix for at least the next 7 days and see if that makes a difference As for the wound care we will be happy to sign off on any forms once daily wound care seems adequate I do not feel it needs twice per day

## 2019-11-19 NOTE — Telephone Encounter (Signed)
[facility]eda Gauze from [facility] called today while with the pt and states he is having a lot of itching and thinks it is coming from one of his meds because he was never on meds before. Leda Gauze called the pharm and went over his meds and the pharmacist told her more than likely it was the protonix. Pt states he will stop it for today to see if itching is better and he does not feel he needs anything in the place of it because his stomach has not been hurting. Also she states she will send over an order for wound care today. She is still waiting to here back from OT about the pillow for him to sit on. States she will send another message today and they are suppose to come out today to see him as well.

## 2019-11-20 ENCOUNTER — Other Ambulatory Visit: Payer: Self-pay

## 2019-11-20 DIAGNOSIS — I69391 Dysphagia following cerebral infarction: Secondary | ICD-10-CM | POA: Diagnosis not present

## 2019-11-20 DIAGNOSIS — K5900 Constipation, unspecified: Secondary | ICD-10-CM | POA: Diagnosis not present

## 2019-11-20 DIAGNOSIS — Z8616 Personal history of COVID-19: Secondary | ICD-10-CM | POA: Diagnosis not present

## 2019-11-20 DIAGNOSIS — L89154 Pressure ulcer of sacral region, stage 4: Secondary | ICD-10-CM | POA: Diagnosis not present

## 2019-11-20 DIAGNOSIS — D649 Anemia, unspecified: Secondary | ICD-10-CM | POA: Diagnosis not present

## 2019-11-20 DIAGNOSIS — R1312 Dysphagia, oropharyngeal phase: Secondary | ICD-10-CM | POA: Diagnosis not present

## 2019-11-20 DIAGNOSIS — I69354 Hemiplegia and hemiparesis following cerebral infarction affecting left non-dominant side: Secondary | ICD-10-CM | POA: Diagnosis not present

## 2019-11-20 DIAGNOSIS — K219 Gastro-esophageal reflux disease without esophagitis: Secondary | ICD-10-CM | POA: Diagnosis not present

## 2019-11-20 DIAGNOSIS — R32 Unspecified urinary incontinence: Secondary | ICD-10-CM | POA: Diagnosis not present

## 2019-11-20 NOTE — Patient Outreach (Signed)
[facility]) Care Management  11/20/2019  [name] [birth year] [id number]   EMMI- pneumonia RED ON EMMI ALERT Day # 9 Date: 11/19/19 Red Alert Reason:  Back to pre-sick level? No  Outreach attempt: spoke with patient. He states he is coming along.  He states that home health is coming now. Discussed red alert with patient. Patient is post COVID and recovery has been slow but he is better but not to his normal.  He states he still has his sore to his bottom that is healing. He reports area being dressed daily. Patient following up with physicians as scheduled and he denies any needs or further follow up at this time.   Plan: RN CM will close case.   Jone Baseman, RN, MSN [facility] Care Management Care Management Coordinator Direct Line [phone] Toll Free: [phone]  Fax: [phone]

## 2019-11-21 ENCOUNTER — Encounter: Payer: Self-pay | Admitting: Physical Medicine & Rehabilitation

## 2019-11-21 ENCOUNTER — Encounter: Payer: Medicare HMO | Attending: Physical Medicine & Rehabilitation | Admitting: Physical Medicine & Rehabilitation

## 2019-11-21 ENCOUNTER — Other Ambulatory Visit: Payer: Self-pay

## 2019-11-21 VITALS — BP 151/82 | HR 119 | Temp 97.3°F | Ht 70.0 in | Wt 156.0 lb

## 2019-11-21 DIAGNOSIS — Z8616 Personal history of COVID-19: Secondary | ICD-10-CM | POA: Diagnosis not present

## 2019-11-21 DIAGNOSIS — R1312 Dysphagia, oropharyngeal phase: Secondary | ICD-10-CM | POA: Diagnosis not present

## 2019-11-21 DIAGNOSIS — I69391 Dysphagia following cerebral infarction: Secondary | ICD-10-CM | POA: Diagnosis not present

## 2019-11-21 DIAGNOSIS — I69354 Hemiplegia and hemiparesis following cerebral infarction affecting left non-dominant side: Secondary | ICD-10-CM | POA: Diagnosis not present

## 2019-11-21 DIAGNOSIS — K219 Gastro-esophageal reflux disease without esophagitis: Secondary | ICD-10-CM | POA: Diagnosis not present

## 2019-11-21 DIAGNOSIS — R32 Unspecified urinary incontinence: Secondary | ICD-10-CM | POA: Diagnosis not present

## 2019-11-21 DIAGNOSIS — D649 Anemia, unspecified: Secondary | ICD-10-CM | POA: Diagnosis not present

## 2019-11-21 DIAGNOSIS — L89154 Pressure ulcer of sacral region, stage 4: Secondary | ICD-10-CM | POA: Diagnosis not present

## 2019-11-21 DIAGNOSIS — K5900 Constipation, unspecified: Secondary | ICD-10-CM | POA: Diagnosis not present

## 2019-11-21 NOTE — Progress Notes (Signed)
Subjective:    Patient ID: [name], male    DOB: 1951/01/17, 69 y.o.   MRN: [id number] 69 year old right-handed male with history of GERD who presented to [facility] 07/26/2019 with shortness of breath and hypoxia.  He was diagnosed with COVID-19 on September 20 of 2020.  Oxygen saturations in the 40th percentile range.  Placed on a nonrebreather mask.  Patient was given IV steroids and remdesivir that he completed 07/30/2019.  He did require intubation for airway protection.  He had 1 unit of convalescent plasma and had received Decadron until 08/04/2019.  He was treated with vancomycin and cefepime through 08/02/2019.  Initial chest x-ray demonstrated diffuse multifocal infiltrates.  CT of the chest negative for pulmonary emboli.  He was admitted to [facility] pH 7.41, PCO2 44 PO2 of 87 on 100% and AA gradient calculates out to 571.  Noted elevated creatinine of 1.51.  Renal service is consulted for elevated creatinine/AKI necessitating the need for hemodialysis 08/07/2019 but has since been deemed chronic Tuesday Thursday Saturday schedule.  Gastroenterology services consulted for ongoing melena drop in hemoglobin and patient was transfused 10 units of packed red blood cells total throughout hospital admission with latest hemoglobin 8.4.  An endoscopy was completed showing no active bleeding or blood in the stomach.  Very large gastric residual mostly tube feeding without anatomic gastric outlet obstruction suggestive gastric ileus.  Patient's hemoglobin hematocrit have stabilized.  Long-term intubation requiring tracheostomy tube 08/22/2019 patient is since been extubated.  Diet has been advanced to regular consistency.  Subcutaneous heparin for DVT prophylaxis.  Hospital course complicated by bouts of atrial fibrillation with RVR 08/09/2019 patient was initially placed on amiodarone.  Neurology service was consulted 08/27/2019 for altered mental status with MRI of the brain showing  small acute to early subacute infarcts in bilateral cerebral cortex and right cerebellum and maintained on low-dose aspirin for CVA prophylaxis.. Patient with development of sacral decubitus wound care dressing changes as per wound care nurse as well as wound care to partial-thickness areas of tissue loss at posterior aspect of penile shaft again with dressing changes as directed.  Therapy evaluations completed patient was admitted for a comprehensive rehab program.  Patient was then able to get out of car with minimal assist improved sequencing. Perform wheelchair mobility 100 feet supervision. Perform stand pivot to the bed PTA set up wheelchair to pivot to the right for optimal outcomes. Occupational Therapy continue to discuss home set up and along with patient decided that at home patient would likely sit edge of bed to wash upper body and return to supine for lower body bathing and dressing task as he was more independent and decreased fall risk. Patient is bathing with minimal assist upper body for hand over hand assistance to utilize left upper extremity to wash right upper extremity.   Admit date: 10/03/2019 Discharge date: 11/07/2019  Positional care visit 14 days HPI Patient is at home now.  His wife is assisting with ADLs and mobility.  He is staying mainly in the wheelchair.  He will ambulate short distances with PT. His bladder is working well. He has a follow-up appointment with vascular surgery. The patient also has a follow-up visit with neurology on February 17.   PCP is Dr. Wolfgang Phoenix  No falls. No new issues since return to home. PT and OT have already started with home health they are awaiting speech therapy evaluation. Pain Inventory Average Pain 8 Pain Right Now 8 My pain is  aching  In the last 24 hours, has pain interfered with the following? General activity 5 Relation with others 6 Enjoyment of life 8 What TIME of day is your pain at its worst? all Sleep (in general)  Fair  Pain is worse with: sitting and inactivity Pain improves with: na Relief from Meds: 0  Mobility walk with assistance use a walker how many minutes can you walk? 0 ability to climb steps?  no do you drive?  no use a wheelchair transfers alone  Function retired I need assistance with the following:  feeding, dressing, bathing and toileting  Neuro/Psych weakness numbness tremor tingling trouble walking dizziness confusion loss of taste or smell  Prior Studies Any changes since last visit?  no  Physicians involved in your care Primary care Dr. Sallee Lange   Family History  Problem Relation Age of Onset  . Colon cancer Neg Hx   . Colon polyps Neg Hx    Social History   Socioeconomic History  . Marital status: Married    Spouse name: Not on file  . Number of children: Not on file  . Years of education: bachelors  . Highest education level: Not on file  Occupational History  . Occupation: Scientist, clinical (histocompatibility and immunogenetics): [place]  Tobacco Use  . Smoking status: Never Smoker  . Smokeless tobacco: Never Used  Substance and Sexual Activity  . Alcohol use: Yes    Alcohol/week: 2.0 standard drinks    Types: 2 Shots of liquor per week    Comment: moderate  . Drug use: No  . Sexual activity: Not on file  Other Topics Concern  . Not on file  Social History Narrative  . Not on file   Social Determinants of Health   Financial Resource Strain:   . Difficulty of Paying Living Expenses: Not on file  Food Insecurity:   . Worried About Charity fundraiser in the Last Year: Not on file  . Ran Out of Food in the Last Year: Not on file  Transportation Needs:   . Lack of Transportation (Medical): Not on file  . Lack of Transportation (Non-Medical): Not on file  Physical Activity:   . Days of Exercise per Week: Not on file  . Minutes of Exercise per Session: Not on file  Stress:   . Feeling of Stress : Not on file  Social Connections:   . Frequency of  Communication with Friends and Family: Not on file  . Frequency of Social Gatherings with Friends and Family: Not on file  . Attends Religious Services: Not on file  . Active Member of Clubs or Organizations: Not on file  . Attends Archivist Meetings: Not on file  . Marital Status: Not on file   Past Surgical History:  Procedure Laterality Date  . ACHILLES TENDON SURGERY Right 2012  . [place] TRANSPOSITION  10/13/2019   Procedure: Basilic Vein Transposition Left Arm;  Surgeon: Angelia Mould, MD;  [facility] OR;  Service: Vascular;;  . COLONOSCOPY  2009   IH  . ESOPHAGOGASTRODUODENOSCOPY N/A 12/25/2014   Procedure: ESOPHAGOGASTRODUODENOSCOPY (EGD);  Surgeon: Danie Binder, MD;  Location: [facility];  Service: Endoscopy;  Laterality: N/A;  830am  . ESOPHAGOGASTRODUODENOSCOPY N/A 08/09/2019   Procedure: ESOPHAGOGASTRODUODENOSCOPY (EGD);  Surgeon: Ronald Lobo, MD;  Location: [facility];  Service: Endoscopy;  Laterality: N/A;  Patient is already sedated, so I do not anticipate need for further sedation.  Okay from my standpoint to do  either at the bedside or in the operating room  . HEMOSTASIS CLIP PLACEMENT  08/09/2019   Procedure: HEMOSTASIS CLIP PLACEMENT;  Surgeon: Ronald Lobo, MD;  Location: [facility] ENDOSCOPY;  Service: Endoscopy;;  . HEMOSTASIS CONTROL  08/09/2019   Procedure: HEMOSTASIS CONTROL;  Surgeon: Ronald Lobo, MD;  Location: [facility] ENDOSCOPY;  Service: Endoscopy;;  epi   . IR FLUORO GUIDE CV LINE LEFT  09/02/2019  . IR REMOVAL TUN CV CATH W/O FL  10/29/2019  . IR US GUIDE VASC ACCESS LEFT  09/02/2019  . KNEE SURGERY Left 1980's  . none    . WRIST SURGERY Left 1970's   Past Medical History:  Diagnosis Date  . COVID-19   . Heartburn    BP (!) 151/82   Pulse (!) 119   Temp (!) 97.3 F (36.3 C)   Ht 5\' 10"  (1.778 m)   Wt 156 lb (70.8 kg) Comment: wheelchair 44lb  SpO2 95%   BMI 22.38 kg/m   Opioid Risk Score:   Fall Risk Score:   `1  Depression screen PHQ 2/9  No flowsheet data found.  Review of Systems  Respiratory: Positive for cough.   Neurological: Positive for weakness and numbness.  All other systems reviewed and are negative.      Objective:   Physical Exam Vitals and nursing note reviewed.  Constitutional:      Appearance: Normal appearance.  HENT:     Head: Normocephalic and atraumatic.  Eyes:     Extraocular Movements: Extraocular movements intact.     Conjunctiva/sclera: Conjunctivae normal.     Pupils: Pupils are equal, round, and reactive to light.  Cardiovascular:     Rate and Rhythm: Normal rate and regular rhythm.     Heart sounds: Normal heart sounds. No murmur.  Pulmonary:     Effort: Pulmonary effort is normal. No respiratory distress.     Breath sounds: Normal breath sounds. No stridor.  Abdominal:     General: Abdomen is flat. Bowel sounds are normal. There is no distension.     Palpations: Abdomen is soft.  Musculoskeletal:        General: Deformity present. No tenderness.     Comments: 1/2 fingerbreadth subluxation left shoulder  Skin:    General: Skin is warm and dry.  Neurological:     Mental Status: He is alert and oriented to person, place, and time.     Comments: Speech without dysarthria or aphasia Motor strength is 5/5 in the right deltoid, bicep, tricep, grip, hip flexor, knee extensor, ankle dorsiflexor and plantar flexor 3 - at the left deltoid bicep tricep finger flexors and extensors 3 - left hip flexor, 4 - left knee extensor, trace left ankle dorsiflexor 2/5 left ankle plantar flexor Sensation reported is equal to light touch bilateral upper and lower limbs  Psychiatric:        Mood and Affect: Mood normal.        Behavior: Behavior normal.           Assessment & Plan:  #1.  History of right frontal infarct with left hemiparesis.  He is making good progress with this therapy.  His stroke rehab is complicated by severe deconditioning following a  prolonged hospitalization for Covid pneumonia. We will continue home health PT OT speech Physical medicine rehab follow-up in 6 weeks Follow-up with PCP Dr. Wolfgang Phoenix for medical issues and prescription refills Follow-up with neurology February 17 Has appointment with vascular surgery.  A shunt was placed and subsequently  renal function improved so that hemodialysis was no longer needed.

## 2019-11-21 NOTE — Telephone Encounter (Signed)
error 

## 2019-11-24 ENCOUNTER — Other Ambulatory Visit: Payer: Self-pay

## 2019-11-24 NOTE — Patient Outreach (Signed)
[facility] [facility]) Care Management  11/24/2019  Lliam Hoh Oct 15, 1951 [id number]   EMMI- Pneumonia RED ON EMMI ALERT Day # 4 Date:  11/23/19 Red Alert Reason:  Had flu shot this year? No  Outreach attempt: Spoke with patient and wife.  Patient doing better and coming along with therapy.  Addressed red alert.  Patient has been sick since September and has not had time to get flu vaccine but her normally takes it.  Patient will continue to get vaccine after he gets better from COVID-19.  Patient declines any further needs or follow up.    Plan: RN CM will close case.   Jone Baseman, RN, MSN [facility] Care Management Care Management Coordinator Direct Line [phone] Toll Free: [phone]  Fax: [phone]

## 2019-11-25 ENCOUNTER — Telehealth (HOSPITAL_COMMUNITY): Payer: Self-pay

## 2019-11-25 DIAGNOSIS — I69391 Dysphagia following cerebral infarction: Secondary | ICD-10-CM | POA: Diagnosis not present

## 2019-11-25 DIAGNOSIS — R1312 Dysphagia, oropharyngeal phase: Secondary | ICD-10-CM | POA: Diagnosis not present

## 2019-11-25 DIAGNOSIS — K5900 Constipation, unspecified: Secondary | ICD-10-CM | POA: Diagnosis not present

## 2019-11-25 DIAGNOSIS — D649 Anemia, unspecified: Secondary | ICD-10-CM | POA: Diagnosis not present

## 2019-11-25 DIAGNOSIS — K219 Gastro-esophageal reflux disease without esophagitis: Secondary | ICD-10-CM | POA: Diagnosis not present

## 2019-11-25 DIAGNOSIS — Z8616 Personal history of COVID-19: Secondary | ICD-10-CM | POA: Diagnosis not present

## 2019-11-25 DIAGNOSIS — I69354 Hemiplegia and hemiparesis following cerebral infarction affecting left non-dominant side: Secondary | ICD-10-CM | POA: Diagnosis not present

## 2019-11-25 DIAGNOSIS — L89154 Pressure ulcer of sacral region, stage 4: Secondary | ICD-10-CM | POA: Diagnosis not present

## 2019-11-25 DIAGNOSIS — R32 Unspecified urinary incontinence: Secondary | ICD-10-CM | POA: Diagnosis not present

## 2019-11-25 NOTE — Progress Notes (Signed)
POST OPERATIVE OFFICE NOTE    CC:  F/u for surgery  HPI:  This is a 69 y.o. male who is s/p First stage left basilic vein transposition by Dr. Scot Dock on 10/13/19.  He is here for follow up.     He was started on CRRT on 08/07/19.  We have been consulted to provide permanent HD access. He has a right TDC.  He denise pain, loss of motor and loss of sensation in the left UE beyond his weakness post right brain CVA.  He states he has had swelling in the left UE since his fistula creation.  He is no longer requiring HD and his [facility] was taken out.  He has a f/u appointment with DR. Edrick Oh 12/10/19.  He is now in CKD stage III with a Cr of 2.67 as of 11/07/19.    Recent history of CZY:SAYTKZSWFUXNA cortex and right cerebellum small acute to subacuteinfarctswithneuromuscular weakness/encephalopathy in December 2020.  He takes daily Asprin and Statin.    No Known Allergies  Current Outpatient Medications  Medication Sig Dispense Refill  . aspirin EC 81 MG EC tablet Take 1 tablet (81 mg total) by mouth daily.    Marland Kitchen atorvastatin (LIPITOR) 40 MG tablet Take 1 tablet (40 mg total) by mouth daily at 6 PM. 30 tablet 0  . diclofenac Sodium (VOLTAREN) 1 % GEL Apply 2 g topically 4 (four) times daily. 2 g 0  . lidocaine (LIDODERM) 5 % Place 1 patch onto the skin daily. Remove & Discard patch within 12 hours or as directed by MD 30 patch 0  . Melatonin 3 MG TABS Take 0.5 tablets (1.5 mg total) by mouth at bedtime as needed (for sleep). 30 tablet 0  . Multiple Vitamin (MULTIVITAMIN WITH MINERALS) TABS tablet Take 1 tablet by mouth daily.    . Nutritional Supplements (FEEDING SUPPLEMENT, NEPRO CARB STEADY,) LIQD Take 237 mLs by mouth 3 (three) times daily between meals. 1000 mL 0  . pantoprazole (PROTONIX) 40 MG tablet Take 1 tablet (40 mg total) by mouth daily at 12 noon. 30 tablet 0  . polyethylene glycol (MIRALAX / GLYCOLAX) 17 g packet Take 17 g by mouth 2 (two) times daily. 14 each 0  . ramelteon  (ROZEREM) 8 MG tablet Take 1 tablet (8 mg total) by mouth at bedtime. 30 tablet 0  . senna-docusate (SENOKOT-S) 8.6-50 MG tablet Take 2 tablets by mouth 2 (two) times daily.    . sucralfate (CARAFATE) 1 GM/10ML suspension Take 10 mLs (1 g total) by mouth every 6 (six) hours. 420 mL 0  . HYDROcodone-acetaminophen (HYCET) 7.5-325 mg/15 ml solution Take 10 mLs by mouth every 4 (four) hours as needed for moderate pain. (Patient not taking: Reported on 11/26/2019) 120 mL 0   No current facility-administered medications for this visit.     ROS:  See HPI  Physical Exam:  -----------+----------+--------+  OUTFLOW VEIN      PSV (cm/s)Diameter (cm)Depth (cm)Describe  +-----------------------+----------+-------------+----------+--------+  Prox SEGMENT UA fistula  456    0.42     1.03        +-----------------------+----------+-------------+----------+--------+  Mid SEGMENT UA       416    0.37     1.19        +-----------------------+----------+-------------+----------+--------+  Dist SEGMENT UA      656    0.31     1.30        +-----------------------+----------+-------------+----------+--------+        Summary:  Patent Basilic  vein transposition fistula.     Incision/Extremities  Well healed left UE incision.  Sensation grossly intact, grip 3/5 left hand secondary to right brain CVA.  Mod edema in the left UE compared to the right UE.  Lungs non labored breathing Heart :  RRR  Assessment/Plan:  This is a 69 y.o. male who is s/p: Left UE first stage basilic av fistula creation with chronic left UE edema. The fistula has not matured in size as we have expected.  He may have central venous stenosis.   He had COVID and was hospitalized, right brain CVA was in renal failure requiring HD via [place].  He is now in CKD stage III with a Cr 2.76.    He has a f/u visit with Dr. Justin Mend 12/10/19.  He will need second stage  basilic transposition verse graft.  He may also need angiogram for central venous stenosis. I will discuss this with Dr. Donnetta Hutching since he is showing kidney recovery and not requiring HD we may wait to proceed with intervention.   We will have him f/u in 5-6 weeks for repeat fistula duplex and exam of left UE edema since he is currently no on HD and has some recovery of his kidney function.       Roxy Horseman , PA-C [facility] [phone]  Clinic MD:  Scot Dock

## 2019-11-25 NOTE — Telephone Encounter (Signed)

## 2019-11-26 ENCOUNTER — Other Ambulatory Visit: Payer: Self-pay

## 2019-11-26 ENCOUNTER — Ambulatory Visit (HOSPITAL_COMMUNITY)
Admission: RE | Admit: 2019-11-26 | Discharge: 2019-11-26 | Disposition: A | Discharge status: Home / Self Care | Payer: Medicare HMO | Source: Ambulatory Visit | Attending: Vascular Surgery | Admitting: Vascular Surgery

## 2019-11-26 ENCOUNTER — Ambulatory Visit: Payer: Self-pay | Admitting: Physician Assistant

## 2019-11-26 VITALS — BP 151/91 | HR 111 | Temp 97.3°F | Resp 18 | Ht 70.0 in | Wt 158.0 lb

## 2019-11-26 DIAGNOSIS — Z992 Dependence on renal dialysis: Secondary | ICD-10-CM | POA: Diagnosis not present

## 2019-11-26 DIAGNOSIS — N184 Chronic kidney disease, stage 4 (severe): Secondary | ICD-10-CM

## 2019-11-27 ENCOUNTER — Other Ambulatory Visit: Payer: Self-pay | Admitting: *Deleted

## 2019-11-27 ENCOUNTER — Emergency Department (HOSPITAL_COMMUNITY)
Admission: EM | Admit: 2019-11-27 | Discharge: 2019-11-27 | Disposition: A | Discharge status: Home / Self Care | Payer: Medicare HMO | Attending: Emergency Medicine | Admitting: Emergency Medicine

## 2019-11-27 ENCOUNTER — Encounter (HOSPITAL_COMMUNITY): Payer: Self-pay | Admitting: Emergency Medicine

## 2019-11-27 ENCOUNTER — Other Ambulatory Visit: Payer: Self-pay

## 2019-11-27 ENCOUNTER — Emergency Department (HOSPITAL_COMMUNITY): Payer: Medicare HMO

## 2019-11-27 DIAGNOSIS — Y939 Activity, unspecified: Secondary | ICD-10-CM | POA: Insufficient documentation

## 2019-11-27 DIAGNOSIS — S0990XA Unspecified injury of head, initial encounter: Secondary | ICD-10-CM | POA: Diagnosis present

## 2019-11-27 DIAGNOSIS — Z87828 Personal history of other (healed) physical injury and trauma: Secondary | ICD-10-CM

## 2019-11-27 DIAGNOSIS — U071 COVID-19: Secondary | ICD-10-CM | POA: Diagnosis not present

## 2019-11-27 DIAGNOSIS — I69391 Dysphagia following cerebral infarction: Secondary | ICD-10-CM | POA: Diagnosis not present

## 2019-11-27 DIAGNOSIS — N184 Chronic kidney disease, stage 4 (severe): Secondary | ICD-10-CM

## 2019-11-27 DIAGNOSIS — Y929 Unspecified place or not applicable: Secondary | ICD-10-CM | POA: Diagnosis not present

## 2019-11-27 DIAGNOSIS — I639 Cerebral infarction, unspecified: Secondary | ICD-10-CM | POA: Insufficient documentation

## 2019-11-27 DIAGNOSIS — S065X0A Traumatic subdural hemorrhage without loss of consciousness, initial encounter: Secondary | ICD-10-CM | POA: Diagnosis not present

## 2019-11-27 DIAGNOSIS — I69354 Hemiplegia and hemiparesis following cerebral infarction affecting left non-dominant side: Secondary | ICD-10-CM | POA: Diagnosis not present

## 2019-11-27 DIAGNOSIS — S065XAA Traumatic subdural hemorrhage with loss of consciousness status unknown, initial encounter: Secondary | ICD-10-CM

## 2019-11-27 DIAGNOSIS — W01190A Fall on same level from slipping, tripping and stumbling with subsequent striking against furniture, initial encounter: Secondary | ICD-10-CM | POA: Insufficient documentation

## 2019-11-27 DIAGNOSIS — Z7982 Long term (current) use of aspirin: Secondary | ICD-10-CM | POA: Diagnosis not present

## 2019-11-27 DIAGNOSIS — R1312 Dysphagia, oropharyngeal phase: Secondary | ICD-10-CM | POA: Diagnosis not present

## 2019-11-27 DIAGNOSIS — S065X9A Traumatic subdural hemorrhage with loss of consciousness of unspecified duration, initial encounter: Secondary | ICD-10-CM

## 2019-11-27 DIAGNOSIS — Y999 Unspecified external cause status: Secondary | ICD-10-CM | POA: Diagnosis not present

## 2019-11-27 DIAGNOSIS — L89154 Pressure ulcer of sacral region, stage 4: Secondary | ICD-10-CM | POA: Diagnosis not present

## 2019-11-27 HISTORY — DX: Personal history of other (healed) physical injury and trauma: Z87.828

## 2019-11-27 MED ORDER — OXYCODONE-ACETAMINOPHEN 5-325 MG PO TABS
1.0000 | ORAL_TABLET | Freq: Once | ORAL | Status: AC
  Administered 2019-11-27: 1 via ORAL
  Filled 2019-11-27: qty 1

## 2019-11-27 MED ORDER — BUTALBITAL-APAP-CAFFEINE 50-325-40 MG PO TABS
1.0000 | ORAL_TABLET | Freq: Once | ORAL | Status: AC
  Administered 2019-11-27: 1 via ORAL
  Filled 2019-11-27: qty 1

## 2019-11-27 MED ORDER — HYDROCODONE-ACETAMINOPHEN 5-325 MG PO TABS: 1.0000 | ORAL_TABLET | Freq: Four times a day (QID) | ORAL | 0 refills | Status: DC | PRN

## 2019-11-27 NOTE — ED Triage Notes (Signed)
Pt had COVID in October and just released in January, had a stroke during that period and have left sided weakness from stroke.  Pt fell this am and hit posterior head on night stand.  Pt did not get help or use walker. As needed.

## 2019-11-28 ENCOUNTER — Emergency Department (HOSPITAL_COMMUNITY): Payer: Medicare HMO

## 2019-11-28 ENCOUNTER — Emergency Department (HOSPITAL_COMMUNITY)
Admission: EM | Admit: 2019-11-28 | Discharge: 2019-11-28 | Disposition: A | Discharge status: Home / Self Care | Payer: Medicare HMO | Attending: Emergency Medicine | Admitting: Emergency Medicine

## 2019-11-28 ENCOUNTER — Other Ambulatory Visit: Payer: Self-pay

## 2019-11-28 ENCOUNTER — Encounter (HOSPITAL_COMMUNITY): Payer: Self-pay

## 2019-11-28 ENCOUNTER — Telehealth: Payer: Self-pay | Admitting: Family Medicine

## 2019-11-28 DIAGNOSIS — Z8616 Personal history of COVID-19: Secondary | ICD-10-CM | POA: Diagnosis not present

## 2019-11-28 DIAGNOSIS — S065X0A Traumatic subdural hemorrhage without loss of consciousness, initial encounter: Secondary | ICD-10-CM | POA: Diagnosis not present

## 2019-11-28 DIAGNOSIS — S065X0D Traumatic subdural hemorrhage without loss of consciousness, subsequent encounter: Secondary | ICD-10-CM | POA: Diagnosis not present

## 2019-11-28 DIAGNOSIS — Z992 Dependence on renal dialysis: Secondary | ICD-10-CM | POA: Insufficient documentation

## 2019-11-28 DIAGNOSIS — Z79899 Other long term (current) drug therapy: Secondary | ICD-10-CM | POA: Insufficient documentation

## 2019-11-28 DIAGNOSIS — W1830XD Fall on same level, unspecified, subsequent encounter: Secondary | ICD-10-CM | POA: Insufficient documentation

## 2019-11-28 NOTE — ED Provider Notes (Signed)
  Face-to-face evaluation   History: Patient complains of headache.  He was here yesterday after a accidental fall, when he was walking, without assistance.  He has had a right brain stroke and has subsequent left hemiparesis.  He is a walker to ambulate, and is not supposed to get up without help.  Yesterday he fell backwards while attempting to walk along, striking the back of his head.  Today after taking hydrocodone he had some hallucinations that have since resolved.  At this time he is back to his baseline according to his wife. Physical exam: Alert, calm and cooperative.  No dysarthria or aphasia.  Pupils are equal round reactive to light.  Extraocular muscles are intact.  Medical screening examination/treatment/procedure(s) were conducted as a shared visit with non-physician practitioner(s) and myself.  I personally evaluated the patient during the encounter    Daleen Bo, MD 11/29/19 [phone]

## 2019-11-28 NOTE — Telephone Encounter (Signed)
Nurses This patient was in the ER due to head injury He has a subdural hematoma (I had spoken with the wife yesterday afternoon and encouraged them to go to the ER.)  Please call his wife [name] Let her know that I am aware of the CAT scan findings The ER note stated for him to follow-up with Dr. Vertell Limber neurosurgery Please asked the wife if the ER indicated when he should follow-up with them? Also find out how [name] is doing this morning. We will need to help assist her getting this appointment. Please gather this information this morning then we can move forward with trying to help her get the follow-up appointment for the neurosurgeon

## 2019-11-28 NOTE — Telephone Encounter (Signed)
Called and spoke with pt's wife and she states he is a little more confused than normal. He keeps trying to get up out of bed. His head is still hurting. Took hydrocodone at 10 and next one is due at 4 but states it has not helped with pain. Ate a little breaksfast but not much and has not wanted any lunch. Sleeping most of the day. States she was not told to follow up with neurosurgery. He has a follow with neurology Dr. Loralie Champagne on the 17th and the kidney dr on the 18th.

## 2019-11-28 NOTE — ED Triage Notes (Signed)
Pt diagnosed with subdural hematoma last night signed out AMA. Pt here for admission.

## 2019-11-28 NOTE — Discharge Instructions (Addendum)
Return if any problems.

## 2019-11-28 NOTE — ED Provider Notes (Signed)
[facility] EMERGENCY DEPARTMENT Provider Note   CSN: [id number] Arrival date & time: 11/28/19  1626     History Chief Complaint  Patient presents with  . Subdural Hematoma    Josemanuel Eakins is a 69 y.o. male.  Pt fell yesterday and hit the back of his head.  Pt's Md called and advised that he return today for repeat ct scan.  Pt reports he hit the back of his head on a table. Pt's wife reports pt had some increased confusion and combative behavior last night.  She reports he has been sleepier today.  Pt has had hydrocodone today.   The history is provided by the spouse. No language interpreter was used.       Past Medical History:  Diagnosis Date  . COVID-19   . Heartburn     Patient Active Problem List   Diagnosis Date Noted  . Sleep disturbance   . Anemia of chronic disease   . Dependent on hemodialysis ([place])   . Dysphagia, post-stroke   . Pressure injury of sacral region, unstageable ([place])   . Oropharyngeal dysphagia 10/03/2019  . Debility 10/03/2019  . Acute blood loss anemia 09/06/2019  . H. pylori infection 09/06/2019  . CVA (cerebral vascular accident) ([place]) 09/06/2019  . Anoxic brain injury ([place]) 09/06/2019  . MSSA bacteremia 09/06/2019  . Positive D dimer 09/06/2019  . Pseudomonas infection 09/06/2019  . Infection due to acinetobacter baumannii 09/06/2019  . Sinus tachycardia 09/05/2019  . Advanced care planning/counseling discussion   . Goals of care, counseling/discussion   . Palliative care by specialist   . Cerebral thrombosis with cerebral infarction 08/28/2019  . Tracheostomy status ([place])   . Acute respiratory distress syndrome (ARDS) due to COVID-19 virus ([place])   . Chest tube in place   . Primary spontaneous pneumothorax   . Acute respiratory failure ([place])   . Gastric ulcer with hemorrhage 08/10/2019  . Constipation 08/09/2019  . Atrial fibrillation with RVR ([place]) 08/07/2019  . Acute renal failure (ARF) ([place]) 08/06/2019  . GI bleed  08/06/2019  . Pneumothorax on right   . Fluid overload 07/27/2019  . Pneumonia due to COVID-19 virus 07/25/2019  . Acute respiratory failure due to COVID-19 ([place]) 07/25/2019  . AKI (acute kidney injury) ([place]) 07/25/2019  . Dyspepsia 11/26/2014  . BPH (benign prostatic hyperplasia) 10/31/2013  . Achilles rupture 04/05/2011  . S/P Achilles tendon repair 04/05/2011    Past Surgical History:  Procedure Laterality Date  . ACHILLES TENDON SURGERY Right 2012  . [place] TRANSPOSITION  10/13/2019   Procedure: Basilic Vein Transposition Left Arm;  Surgeon: Angelia Mould, MD;  [facility];  Service: Vascular;;  . COLONOSCOPY  2009   [facility]  . ESOPHAGOGASTRODUODENOSCOPY N/A 12/25/2014   Procedure: ESOPHAGOGASTRODUODENOSCOPY (EGD);  Surgeon: Danie Binder, MD;  Location: [facility] ENDO SUITE;  Service: Endoscopy;  Laterality: N/A;  830am  . ESOPHAGOGASTRODUODENOSCOPY N/A 08/09/2019   Procedure: ESOPHAGOGASTRODUODENOSCOPY (EGD);  Surgeon: Ronald Lobo, MD;  Location: [facility];  Service: Endoscopy;  Laterality: N/A;  Patient is already sedated, so I do not anticipate need for further sedation.  Okay from my standpoint to do either at the bedside or in the operating room  . HEMOSTASIS CLIP PLACEMENT  08/09/2019   Procedure: HEMOSTASIS CLIP PLACEMENT;  Surgeon: Ronald Lobo, MD;  Location: [facility] ENDOSCOPY;  Service: Endoscopy;;  . HEMOSTASIS CONTROL  08/09/2019   Procedure: HEMOSTASIS CONTROL;  Surgeon: Ronald Lobo, MD;  Location: [facility] ENDOSCOPY;  Service: Endoscopy;;  epi   . IR FLUORO GUIDE CV LINE LEFT  09/02/2019  . IR REMOVAL TUN CV CATH W/O FL  10/29/2019  . IR US GUIDE VASC ACCESS LEFT  09/02/2019  . KNEE SURGERY Left 1980's  . none    . WRIST SURGERY Left 1970's       Family History  Problem Relation Age of Onset  . Colon cancer Neg Hx   . Colon polyps Neg Hx     Social History   Tobacco Use  . Smoking status: Never Smoker  . Smokeless tobacco: Never Used    Substance Use Topics  . Alcohol use: Yes    Alcohol/week: 2.0 standard drinks    Types: 2 Shots of liquor per week    Comment: moderate  . Drug use: No    Home Medications Prior to Admission medications   Medication Sig Start Date End Date Taking? Authorizing Provider  aspirin EC 81 MG EC tablet Take 1 tablet (81 mg total) by mouth daily. Patient not taking: Reported on 11/27/2019 10/03/19   Loren Racer, PA-C  atorvastatin (LIPITOR) 40 MG tablet Take 1 tablet (40 mg total) by mouth daily at 6 PM. Patient not taking: Reported on 11/27/2019 11/06/19   Angiulli, Lavon Paganini, PA-C  diclofenac Sodium (VOLTAREN) 1 % GEL Apply 2 g topically 4 (four) times daily. Patient not taking: Reported on 11/27/2019 11/06/19   Angiulli, Lavon Paganini, PA-C  HYDROcodone-acetaminophen (NORCO/VICODIN) 5-325 MG tablet Take 1 tablet by mouth every 6 (six) hours as needed. 11/27/19   Virgel Manifold, MD  lidocaine (LIDODERM) 5 % Place 1 patch onto the skin daily. Remove & Discard patch within 12 hours or as directed by MD Patient not taking: Reported on 11/27/2019 11/07/19   Angiulli, Lavon Paganini, PA-C  Melatonin 3 MG TABS Take 0.5 tablets (1.5 mg total) by mouth at bedtime as needed (for sleep). Patient not taking: Reported on 11/27/2019 11/06/19   Angiulli, Lavon Paganini, PA-C  Multiple Vitamin (MULTIVITAMIN WITH MINERALS) TABS tablet Take 1 tablet by mouth daily. 11/07/19   Angiulli, Lavon Paganini, PA-C  Nutritional Supplements (FEEDING SUPPLEMENT, NEPRO CARB STEADY,) LIQD Take 237 mLs by mouth 3 (three) times daily between meals. Patient not taking: Reported on 11/27/2019 11/06/19   Angiulli, Lavon Paganini, PA-C  pantoprazole (PROTONIX) 40 MG tablet Take 1 tablet (40 mg total) by mouth daily at 12 noon. Patient not taking: Reported on 11/27/2019 11/06/19   Angiulli, Lavon Paganini, PA-C  polyethylene glycol (MIRALAX / GLYCOLAX) 17 g packet Take 17 g by mouth 2 (two) times daily. Patient not taking: Reported on 11/27/2019 11/06/19   Angiulli, Lavon Paganini, PA-C   ramelteon (ROZEREM) 8 MG tablet Take 1 tablet (8 mg total) by mouth at bedtime. Patient not taking: Reported on 11/27/2019 11/06/19   Angiulli, Lavon Paganini, PA-C  senna-docusate (SENOKOT-S) 8.6-50 MG tablet Take 2 tablets by mouth 2 (two) times daily. Patient not taking: Reported on 11/27/2019 11/06/19   Angiulli, Lavon Paganini, PA-C  sucralfate (CARAFATE) 1 GM/10ML suspension Take 10 mLs (1 g total) by mouth every 6 (six) hours. Patient not taking: Reported on 11/27/2019 11/06/19   [place], Lavon Paganini, PA-C    Allergies    Patient has no known allergies.  Review of Systems   Review of Systems  All other systems reviewed and are negative.   Physical Exam Updated Vital Signs BP (!) 148/88 (BP Location: Right Arm)   Pulse (!) 110   Temp 99.5 F (37.5 C) (Oral)  Resp 15   SpO2 98%   Physical Exam Vitals and nursing note reviewed.  HENT:     Head: Normocephalic.  Cardiovascular:     Rate and Rhythm: Normal rate.  Pulmonary:     Effort: Pulmonary effort is normal.  Abdominal:     General: Abdomen is flat.  Skin:    General: Skin is warm.  Neurological:     Mental Status: He is alert. Mental status is at baseline.  Psychiatric:        Mood and Affect: Mood normal.     ED Results / Procedures / Treatments   Labs (all labs ordered are listed, but only abnormal results are displayed) Labs Reviewed - No data to display  EKG None  Radiology CT Head Wo Contrast  Result Date: 11/28/2019 CLINICAL DATA:  Fall, positive subdural, left AMA EXAM: CT HEAD WITHOUT CONTRAST TECHNIQUE: Contiguous axial images were obtained from the base of the skull through the vertex without intravenous contrast. COMPARISON:  CT 11/27/2019 FINDINGS: Brain: Redemonstration of a 3 mm subdural hematoma along the high right frontoparietal convexity with a small amount of hemorrhage previously seen along the falx appearing slightly redistributed on this exam (coronal 4/34). No significant hemorrhagic expansion. No new  areas of hemorrhage are seen. No significant mass effect or midline shift. The basal cisterns are patent. No CT evident areas of acute infarction. Vascular: No hyperdense vessel or unexpected calcification. Skull: No calvarial fracture or suspicious osseous lesion. No scalp swelling or hematoma. Sinuses/Orbits: Paranasal sinuses are predominantly clear. Trace bilateral mastoid effusion without visible temporal bone fracture identified. Included orbital structures are unremarkable. Other: None IMPRESSION: 1. Small 3 mm subdural hematoma along the high right frontoparietal 2. Trace parafalcine subdural appears redistributed on this exam. 3. No hemorrhagic expansion or new areas of hemorrhage. 4. No visible scalp swelling or calvarial fracture. 5. Trace bilateral mastoid effusions without visible temporal bone fracture. Electronically Signed   By: Lovena Le M.D.   On: 11/28/2019 17:55   CT Head Wo Contrast  Result Date: 11/27/2019 CLINICAL DATA:  Patient with recent stroke. Patient status post fall. EXAM: CT HEAD WITHOUT CONTRAST CT CERVICAL SPINE WITHOUT CONTRAST TECHNIQUE: Multidetector CT imaging of the head and cervical spine was performed following the standard protocol without intravenous contrast. Multiplanar CT image reconstructions of the cervical spine were also generated. COMPARISON:  None. FINDINGS: CT HEAD FINDINGS Brain: Ventricles and sulci are appropriate for patient's age. There is a small subdural hematoma overlying the right cerebral hemisphere measuring up to 3 mm (image 40; series 4). No significant mass effect demonstrated exam. Vascular: Unremarkable Skull: Intact. Sinuses/Orbits: Paranasal sinuses are well aerated. Mastoid air cells are unremarkable. Orbits are unremarkable. Other: None. CT CERVICAL SPINE FINDINGS Alignment: Straightening of the normal cervical lordosis. Skull base and vertebrae: No acute fracture. No primary bone lesion or focal pathologic process. Soft tissues and spinal  canal: No prevertebral fluid or swelling. No visible canal hematoma. Disc levels: Multilevel degenerative disc disease most pronounced C3-4, C4-5, C5-6 and C6-7. No acute fracture Upper chest: Unremarkable. Other: None. IMPRESSION: 1. Small subdural hematoma overlying the right cerebral hemisphere. No significant mass effect. 2. No acute cervical spine fracture. Multilevel degenerative disc disease. 3. Critical Value/emergent results were called by telephone at the time of interpretation on 11/27/2019 at 7:13 pm to provider [facility] , who verbally acknowledged these results. Electronically Signed   By: Lovey Newcomer M.D.   On: 11/27/2019 19:18   CT Cervical Spine Wo  Contrast  Result Date: 11/27/2019 CLINICAL DATA:  Patient with recent stroke. Patient status post fall. EXAM: CT HEAD WITHOUT CONTRAST CT CERVICAL SPINE WITHOUT CONTRAST TECHNIQUE: Multidetector CT imaging of the head and cervical spine was performed following the standard protocol without intravenous contrast. Multiplanar CT image reconstructions of the cervical spine were also generated. COMPARISON:  None. FINDINGS: CT HEAD FINDINGS Brain: Ventricles and sulci are appropriate for patient's age. There is a small subdural hematoma overlying the right cerebral hemisphere measuring up to 3 mm (image 40; series 4). No significant mass effect demonstrated exam. Vascular: Unremarkable Skull: Intact. Sinuses/Orbits: Paranasal sinuses are well aerated. Mastoid air cells are unremarkable. Orbits are unremarkable. Other: None. CT CERVICAL SPINE FINDINGS Alignment: Straightening of the normal cervical lordosis. Skull base and vertebrae: No acute fracture. No primary bone lesion or focal pathologic process. Soft tissues and spinal canal: No prevertebral fluid or swelling. No visible canal hematoma. Disc levels: Multilevel degenerative disc disease most pronounced C3-4, C4-5, C5-6 and C6-7. No acute fracture Upper chest: Unremarkable. Other: None. IMPRESSION: 1.  Small subdural hematoma overlying the right cerebral hemisphere. No significant mass effect. 2. No acute cervical spine fracture. Multilevel degenerative disc disease. 3. Critical Value/emergent results were called by telephone at the time of interpretation on 11/27/2019 at 7:13 pm to provider [facility] , who verbally acknowledged these results. Electronically Signed   By: Lovey Newcomer M.D.   On: 11/27/2019 19:18    Procedures Procedures (including critical care time)  Medications Ordered in ED Medications - No data to display  ED Course  I have reviewed the triage vital signs and the nursing notes.  Pertinent labs & imaging results that were available during my care of the patient were reviewed by me and considered in my medical decision making (see chart for details).    MDM Rules/Calculators/A&P                      MDM: Ct scan repeated today and is unchanged.   Dr. Eulis Foster in to see and examine pt  Final Clinical Impression(s) / ED Diagnoses Final diagnoses:  Subdural hematoma due to concussion, without loss of consciousness, subsequent encounter    Rx / DC Orders ED Discharge Orders    None    An After Visit Summary was printed and given to the patient.    Fransico Meadow, [place] 11/28/19 1821    Daleen Bo, MD 11/29/19 [phone]

## 2019-11-30 NOTE — Telephone Encounter (Signed)
I did discuss the situation with the wife.  Given his subdural hematoma she stated that the ER told her he should stay but he refused to stay given his confusion as well as the diagnosis on the CAT scan they were advised to go back to the ER for further evaluation

## 2019-12-01 ENCOUNTER — Other Ambulatory Visit: Payer: Self-pay | Admitting: Family Medicine

## 2019-12-01 ENCOUNTER — Telehealth: Payer: Self-pay | Admitting: Family Medicine

## 2019-12-01 DIAGNOSIS — K5900 Constipation, unspecified: Secondary | ICD-10-CM | POA: Diagnosis not present

## 2019-12-01 DIAGNOSIS — D649 Anemia, unspecified: Secondary | ICD-10-CM | POA: Diagnosis not present

## 2019-12-01 DIAGNOSIS — I69354 Hemiplegia and hemiparesis following cerebral infarction affecting left non-dominant side: Secondary | ICD-10-CM | POA: Diagnosis not present

## 2019-12-01 DIAGNOSIS — R1312 Dysphagia, oropharyngeal phase: Secondary | ICD-10-CM | POA: Diagnosis not present

## 2019-12-01 DIAGNOSIS — K219 Gastro-esophageal reflux disease without esophagitis: Secondary | ICD-10-CM | POA: Diagnosis not present

## 2019-12-01 DIAGNOSIS — Z8616 Personal history of COVID-19: Secondary | ICD-10-CM | POA: Diagnosis not present

## 2019-12-01 DIAGNOSIS — R32 Unspecified urinary incontinence: Secondary | ICD-10-CM | POA: Diagnosis not present

## 2019-12-01 DIAGNOSIS — L89154 Pressure ulcer of sacral region, stage 4: Secondary | ICD-10-CM | POA: Diagnosis not present

## 2019-12-01 DIAGNOSIS — I69391 Dysphagia following cerebral infarction: Secondary | ICD-10-CM | POA: Diagnosis not present

## 2019-12-01 NOTE — Telephone Encounter (Signed)
Medication added to allergy list

## 2019-12-01 NOTE — Telephone Encounter (Signed)
I removed both medications from his med list Nurses please list hydrocodone as a allergy Would be considered low risk Under the notations please put disorientation erratic behavior Thank you

## 2019-12-01 NOTE — Telephone Encounter (Signed)
Home health nurse calling with an update.  Wanted [name] to know that his new cholesterol meds caused him to itch so they stopped that.  Also she said that the patient said that he is allergic to Vicodin because it made him "crazy in his head" when he took it.  She wanted that noted in his chart.  Just fyi, no return call needed but her name is Leda Gauze with [facility] home health [phone].

## 2019-12-02 NOTE — ED Provider Notes (Signed)
[facility] Provider Note   CSN: [id number] Arrival date & time: 11/27/19  1457     History Chief Complaint  Patient presents with  . Fall    [name] is a 69 y.o. male.  HPI   69 year old male presenting with headache.  Patient fell backwards earlier today striking the back of his head against a table.  No LOC.  Persistent headache since that time.  Patient with recent stroke with left-sided weakness.  He denies any acute neurological complaints but his wife states that he keeps making the same statements over and over.  Past Medical History:  Diagnosis Date  . COVID-19   . Heartburn     Patient Active Problem List   Diagnosis Date Noted  . Sleep disturbance   . Anemia of chronic disease   . Dependent on hemodialysis ([place])   . Dysphagia, post-stroke   . Pressure injury of sacral region, unstageable ([place])   . Oropharyngeal dysphagia 10/03/2019  . Debility 10/03/2019  . Acute blood loss anemia 09/06/2019  . H. pylori infection 09/06/2019  . CVA (cerebral vascular accident) ([place]) 09/06/2019  . Anoxic brain injury ([place]) 09/06/2019  . MSSA bacteremia 09/06/2019  . Positive D dimer 09/06/2019  . Pseudomonas infection 09/06/2019  . Infection due to acinetobacter baumannii 09/06/2019  . Sinus tachycardia 09/05/2019  . Advanced care planning/counseling discussion   . Goals of care, counseling/discussion   . Palliative care by specialist   . Cerebral thrombosis with cerebral infarction 08/28/2019  . Tracheostomy status ([place])   . Acute respiratory distress syndrome (ARDS) due to COVID-19 virus ([place])   . Chest tube in place   . Primary spontaneous pneumothorax   . Acute respiratory failure ([place])   . Gastric ulcer with hemorrhage 08/10/2019  . Constipation 08/09/2019  . Atrial fibrillation with RVR ([place]) 08/07/2019  . Acute renal failure (ARF) ([place]) 08/06/2019  . GI bleed 08/06/2019  . Pneumothorax on right   . Fluid overload 07/27/2019  .  Pneumonia due to COVID-19 virus 07/25/2019  . Acute respiratory failure due to COVID-19 ([place]) 07/25/2019  . AKI (acute kidney injury) ([place]) 07/25/2019  . Dyspepsia 11/26/2014  . BPH (benign prostatic hyperplasia) 10/31/2013  . Achilles rupture 04/05/2011  . S/P Achilles tendon repair 04/05/2011    Past Surgical History:  Procedure Laterality Date  . ACHILLES TENDON SURGERY Right 2012  . [place] TRANSPOSITION  10/13/2019   Procedure: Basilic Vein Transposition Left Arm;  Surgeon: Angelia Mould, MD;  Location: [facility] OR;  Service: Vascular;;  . COLONOSCOPY  2009   IH  . ESOPHAGOGASTRODUODENOSCOPY N/A 12/25/2014   Procedure: ESOPHAGOGASTRODUODENOSCOPY (EGD);  Surgeon: Danie Binder, MD;  Location: [facility] SUITE;  Service: Endoscopy;  Laterality: N/A;  830am  . ESOPHAGOGASTRODUODENOSCOPY N/A 08/09/2019   Procedure: ESOPHAGOGASTRODUODENOSCOPY (EGD);  Surgeon: Ronald Lobo, MD;  Location: [facility];  Service: Endoscopy;  Laterality: N/A;  Patient is already sedated, so I do not anticipate need for further sedation.  Okay from my standpoint to do either at the bedside or in the operating room  . HEMOSTASIS CLIP PLACEMENT  08/09/2019   Procedure: HEMOSTASIS CLIP PLACEMENT;  Surgeon: Ronald Lobo, MD;  Location: [facility] ENDOSCOPY;  Service: Endoscopy;;  . HEMOSTASIS CONTROL  08/09/2019   Procedure: HEMOSTASIS CONTROL;  Surgeon: Ronald Lobo, MD;  Location: [facility] ENDOSCOPY;  Service: Endoscopy;;  epi   . IR FLUORO GUIDE CV LINE LEFT  09/02/2019  . IR REMOVAL TUN CV CATH W/O FL  10/29/2019  .  IR US GUIDE VASC ACCESS LEFT  09/02/2019  . KNEE SURGERY Left 1980's  . none    . WRIST SURGERY Left 1970's       Family History  Problem Relation Age of Onset  . Colon cancer Neg Hx   . Colon polyps Neg Hx     Social History   Tobacco Use  . Smoking status: Never Smoker  . Smokeless tobacco: Never Used  Substance Use Topics  . Alcohol use: Yes    Alcohol/week: 2.0 standard  drinks    Types: 2 Shots of liquor per week    Comment: moderate  . Drug use: No    Home Medications Prior to Admission medications   Medication Sig Start Date End Date Taking? Authorizing Provider  Multiple Vitamin (MULTIVITAMIN WITH MINERALS) TABS tablet Take 1 tablet by mouth daily. 11/07/19  Yes Angiulli, Lavon Paganini, PA-C  aspirin EC 81 MG EC tablet Take 1 tablet (81 mg total) by mouth daily. Patient not taking: Reported on 11/27/2019 10/03/19   Loren Racer, PA-C  diclofenac Sodium (VOLTAREN) 1 % GEL Apply 2 g topically 4 (four) times daily. Patient not taking: Reported on 11/27/2019 11/06/19   Angiulli, Lavon Paganini, PA-C  lidocaine (LIDODERM) 5 % Place 1 patch onto the skin daily. Remove & Discard patch within 12 hours or as directed by MD Patient not taking: Reported on 11/27/2019 11/07/19   Angiulli, Lavon Paganini, PA-C  Melatonin 3 MG TABS Take 0.5 tablets (1.5 mg total) by mouth at bedtime as needed (for sleep). Patient not taking: Reported on 11/27/2019 11/06/19   Angiulli, Lavon Paganini, PA-C  Nutritional Supplements (FEEDING SUPPLEMENT, NEPRO CARB STEADY,) LIQD Take 237 mLs by mouth 3 (three) times daily between meals. Patient not taking: Reported on 11/27/2019 11/06/19   Angiulli, Lavon Paganini, PA-C  pantoprazole (PROTONIX) 40 MG tablet Take 1 tablet (40 mg total) by mouth daily at 12 noon. Patient not taking: Reported on 11/27/2019 11/06/19   Angiulli, Lavon Paganini, PA-C  polyethylene glycol (MIRALAX / GLYCOLAX) 17 g packet Take 17 g by mouth 2 (two) times daily. Patient not taking: Reported on 11/27/2019 11/06/19   Angiulli, Lavon Paganini, PA-C  ramelteon (ROZEREM) 8 MG tablet Take 1 tablet (8 mg total) by mouth at bedtime. Patient not taking: Reported on 11/27/2019 11/06/19   Angiulli, Lavon Paganini, PA-C  senna-docusate (SENOKOT-S) 8.6-50 MG tablet Take 2 tablets by mouth 2 (two) times daily. Patient not taking: Reported on 11/27/2019 11/06/19   Angiulli, Lavon Paganini, PA-C  sucralfate (CARAFATE) 1 GM/10ML suspension Take 10  mLs (1 g total) by mouth every 6 (six) hours. Patient not taking: Reported on 11/27/2019 11/06/19   Angiulli, Lavon Paganini, PA-C    Allergies    Hydrocodone-acetaminophen  Review of Systems   Review of Systems All systems reviewed and negative, other than as noted in HPI.  Physical Exam Updated Vital Signs BP (!) 145/83 (BP Location: Right Arm)   Pulse (!) 101   Temp 98.2 F (36.8 C) (Oral)   Resp 18   Ht 5\' 9"  (1.753 m)   Wt 66.2 kg   SpO2 96%   BMI 21.56 kg/m   Physical Exam Vitals and nursing note reviewed.  Constitutional:      General: He is not in acute distress.    Appearance: He is well-developed.  HENT:     Head: Normocephalic and atraumatic.  Eyes:     General:        Right eye: No discharge.  Left eye: No discharge.     Conjunctiva/sclera: Conjunctivae normal.  Cardiovascular:     Rate and Rhythm: Normal rate and regular rhythm.     Heart sounds: Normal heart sounds. No murmur. No friction rub. No gallop.   Pulmonary:     Effort: Pulmonary effort is normal. No respiratory distress.     Breath sounds: Normal breath sounds.  Abdominal:     General: There is no distension.     Palpations: Abdomen is soft.     Tenderness: There is no abdominal tenderness.  Musculoskeletal:        General: No tenderness.     Cervical back: Neck supple.     Comments: No midline spinal tenderness. No scalp hematoma/tenderness.   Skin:    General: Skin is warm and dry.  Neurological:     Mental Status: He is alert.     Comments: Speech clear.  Following commands.  Left-sided weakness.  Patient with somewhat repetitive statements.  Asking repeatedly to leave.  Stated several times that he had a headache.     ED Results / Procedures / Treatments   Labs (all labs ordered are listed, but only abnormal results are displayed) Labs Reviewed - No data to display  EKG None  Radiology No results found.   CT Head Wo Contrast  Result Date: 11/28/2019 CLINICAL DATA:  Fall,  positive subdural, left AMA EXAM: CT HEAD WITHOUT CONTRAST TECHNIQUE: Contiguous axial images were obtained from the base of the skull through the vertex without intravenous contrast. COMPARISON:  CT 11/27/2019 FINDINGS: Brain: Redemonstration of a 3 mm subdural hematoma along the high right frontoparietal convexity with a small amount of hemorrhage previously seen along the falx appearing slightly redistributed on this exam (coronal 4/34). No significant hemorrhagic expansion. No new areas of hemorrhage are seen. No significant mass effect or midline shift. The basal cisterns are patent. No CT evident areas of acute infarction. Vascular: No hyperdense vessel or unexpected calcification. Skull: No calvarial fracture or suspicious osseous lesion. No scalp swelling or hematoma. Sinuses/Orbits: Paranasal sinuses are predominantly clear. Trace bilateral mastoid effusion without visible temporal bone fracture identified. Included orbital structures are unremarkable. Other: None IMPRESSION: 1. Small 3 mm subdural hematoma along the high right frontoparietal 2. Trace parafalcine subdural appears redistributed on this exam. 3. No hemorrhagic expansion or new areas of hemorrhage. 4. No visible scalp swelling or calvarial fracture. 5. Trace bilateral mastoid effusions without visible temporal bone fracture. Electronically Signed   By: Lovena Le M.D.   On: 11/28/2019 17:55   CT Head Wo Contrast  Result Date: 11/27/2019 CLINICAL DATA:  Patient with recent stroke. Patient status post fall. EXAM: CT HEAD WITHOUT CONTRAST CT CERVICAL SPINE WITHOUT CONTRAST TECHNIQUE: Multidetector CT imaging of the head and cervical spine was performed following the standard protocol without intravenous contrast. Multiplanar CT image reconstructions of the cervical spine were also generated. COMPARISON:  None. FINDINGS: CT HEAD FINDINGS Brain: Ventricles and sulci are appropriate for patient's age. There is a small subdural hematoma  overlying the right cerebral hemisphere measuring up to 3 mm (image 40; series 4). No significant mass effect demonstrated exam. Vascular: Unremarkable Skull: Intact. Sinuses/Orbits: Paranasal sinuses are well aerated. Mastoid air cells are unremarkable. Orbits are unremarkable. Other: None. CT CERVICAL SPINE FINDINGS Alignment: Straightening of the normal cervical lordosis. Skull base and vertebrae: No acute fracture. No primary bone lesion or focal pathologic process. Soft tissues and spinal canal: No prevertebral fluid or swelling. No visible canal hematoma.  Disc levels: Multilevel degenerative disc disease most pronounced C3-4, C4-5, C5-6 and C6-7. No acute fracture Upper chest: Unremarkable. Other: None. IMPRESSION: 1. Small subdural hematoma overlying the right cerebral hemisphere. No significant mass effect. 2. No acute cervical spine fracture. Multilevel degenerative disc disease. 3. Critical Value/emergent results were called by telephone at the time of interpretation on 11/27/2019 at 7:13 pm to provider [facility] , who verbally acknowledged these results. Electronically Signed   By: Lovey Newcomer M.D.   On: 11/27/2019 19:18   CT Cervical Spine Wo Contrast  Result Date: 11/27/2019 CLINICAL DATA:  Patient with recent stroke. Patient status post fall. EXAM: CT HEAD WITHOUT CONTRAST CT CERVICAL SPINE WITHOUT CONTRAST TECHNIQUE: Multidetector CT imaging of the head and cervical spine was performed following the standard protocol without intravenous contrast. Multiplanar CT image reconstructions of the cervical spine were also generated. COMPARISON:  None. FINDINGS: CT HEAD FINDINGS Brain: Ventricles and sulci are appropriate for patient's age. There is a small subdural hematoma overlying the right cerebral hemisphere measuring up to 3 mm (image 40; series 4). No significant mass effect demonstrated exam. Vascular: Unremarkable Skull: Intact. Sinuses/Orbits: Paranasal sinuses are well aerated. Mastoid air  cells are unremarkable. Orbits are unremarkable. Other: None. CT CERVICAL SPINE FINDINGS Alignment: Straightening of the normal cervical lordosis. Skull base and vertebrae: No acute fracture. No primary bone lesion or focal pathologic process. Soft tissues and spinal canal: No prevertebral fluid or swelling. No visible canal hematoma. Disc levels: Multilevel degenerative disc disease most pronounced C3-4, C4-5, C5-6 and C6-7. No acute fracture Upper chest: Unremarkable. Other: None. IMPRESSION: 1. Small subdural hematoma overlying the right cerebral hemisphere. No significant mass effect. 2. No acute cervical spine fracture. Multilevel degenerative disc disease. 3. Critical Value/emergent results were called by telephone at the time of interpretation on 11/27/2019 at 7:13 pm to provider [facility] , who verbally acknowledged these results. Electronically Signed   By: Lovey Newcomer M.D.   On: 11/27/2019 19:18   VAS US DUPLEX DIALYSIS ACCESS (AVF,AVG)  Result Date: 11/27/2019 DIALYSIS ACCESS Reason for Exam: Routine follow up. Access Site: Left Upper Extremity. Access Type: Basilic vein transposition first stage 10/13/2019. Limitations: Exam performed in wheel chair Performing Technologist: Alvia Grove RVT  Examination Guidelines: A complete evaluation includes B-mode imaging, spectral Doppler, color Doppler, and power Doppler as needed of all accessible portions of each vessel. Unilateral testing is considered an integral part of a complete examination. Limited examinations for reoccurring indications may be performed as noted.  Findings: +--------------------+----------+-----------------+--------+ AVF                 PSV (cm/s)Flow Vol (mL/min)Comments +--------------------+----------+-----------------+--------+ Native artery inflow   165          1672                +--------------------+----------+-----------------+--------+ AVF Anastomosis        439                               +--------------------+----------+-----------------+--------+  +-----------------------+----------+-------------+----------+--------+ OUTFLOW VEIN           PSV (cm/s)Diameter (cm)Depth (cm)Describe +-----------------------+----------+-------------+----------+--------+ Prox SEGMENT UA fistula   456        0.42        1.03            +-----------------------+----------+-------------+----------+--------+ Mid SEGMENT UA            416  0.37        1.19            +-----------------------+----------+-------------+----------+--------+ Dist SEGMENT UA           656        0.31        1.30            +-----------------------+----------+-------------+----------+--------+   Summary: Patent Basilic vein transposition fistula.  *See table(s) above for measurements and observations.  Diagnosing physician: Ruta Hinds MD Electronically signed by Ruta Hinds MD on 11/27/2019 at 9:13:38 AM.   --------------------------------------------------------------------------------   Final     Procedures Procedures (including critical care time)  Medications Ordered in ED Medications  oxyCODONE-acetaminophen (PERCOCET/ROXICET) 5-325 MG per tablet 1 tablet (1 tablet Oral Given 11/27/19 1635)  butalbital-acetaminophen-caffeine (FIORICET) 50-325-40 MG per tablet 1 tablet (1 tablet Oral Given 11/27/19 1848)    ED Course  I have reviewed the triage vital signs and the nursing notes.  Pertinent labs & imaging results that were available during my care of the patient were reviewed by me and considered in my medical decision making (see chart for details).    MDM Rules/Calculators/A&P                     69 year old male with a headache after a fall.  Imaging showing a small subdural hematoma.  Discussed with patient and his wife.  They are very adamant about leaving.  They actually wanted to leave prior to the CT even being done.  I discussed results with them.  I explained that I wanted  neurosurgery to review the scans and give their recommendations.  They would still like to leave.  I explained that admission for observation may be more prudent since he still has repetitive speech.  His wife is insisting on taking him home. she says she feels comfortable watching him there.  She states that she can bring him back if needed.  Neurosurgery FU.  Advised to avoid NSAIDs. Provided prescription for vicodin PRN.Return precautions discussed.  Final Clinical Impression(s) / ED Diagnoses Final diagnoses:  SDH (subdural hematoma) ([place])    Rx / DC Orders ED Discharge Orders         Ordered    HYDROcodone-acetaminophen (NORCO/VICODIN) 5-325 MG tablet  Every 6 hours PRN,   Status:  Discontinued     11/27/19 1935           Virgel Manifold, MD 12/02/19 [phone]

## 2019-12-04 DIAGNOSIS — L89154 Pressure ulcer of sacral region, stage 4: Secondary | ICD-10-CM | POA: Diagnosis not present

## 2019-12-04 DIAGNOSIS — I69391 Dysphagia following cerebral infarction: Secondary | ICD-10-CM | POA: Diagnosis not present

## 2019-12-04 DIAGNOSIS — R1312 Dysphagia, oropharyngeal phase: Secondary | ICD-10-CM | POA: Diagnosis not present

## 2019-12-04 DIAGNOSIS — K219 Gastro-esophageal reflux disease without esophagitis: Secondary | ICD-10-CM | POA: Diagnosis not present

## 2019-12-04 DIAGNOSIS — K5900 Constipation, unspecified: Secondary | ICD-10-CM | POA: Diagnosis not present

## 2019-12-04 DIAGNOSIS — R32 Unspecified urinary incontinence: Secondary | ICD-10-CM | POA: Diagnosis not present

## 2019-12-04 DIAGNOSIS — Z8616 Personal history of COVID-19: Secondary | ICD-10-CM | POA: Diagnosis not present

## 2019-12-04 DIAGNOSIS — I69354 Hemiplegia and hemiparesis following cerebral infarction affecting left non-dominant side: Secondary | ICD-10-CM | POA: Diagnosis not present

## 2019-12-04 DIAGNOSIS — D649 Anemia, unspecified: Secondary | ICD-10-CM | POA: Diagnosis not present

## 2019-12-05 DIAGNOSIS — M6281 Muscle weakness (generalized): Secondary | ICD-10-CM | POA: Diagnosis not present

## 2019-12-05 DIAGNOSIS — L89153 Pressure ulcer of sacral region, stage 3: Secondary | ICD-10-CM | POA: Diagnosis not present

## 2019-12-05 DIAGNOSIS — U071 COVID-19: Secondary | ICD-10-CM | POA: Diagnosis not present

## 2019-12-09 DIAGNOSIS — K5900 Constipation, unspecified: Secondary | ICD-10-CM | POA: Diagnosis not present

## 2019-12-09 DIAGNOSIS — Z8616 Personal history of COVID-19: Secondary | ICD-10-CM | POA: Diagnosis not present

## 2019-12-09 DIAGNOSIS — L89154 Pressure ulcer of sacral region, stage 4: Secondary | ICD-10-CM | POA: Diagnosis not present

## 2019-12-09 DIAGNOSIS — K219 Gastro-esophageal reflux disease without esophagitis: Secondary | ICD-10-CM | POA: Diagnosis not present

## 2019-12-09 DIAGNOSIS — I69391 Dysphagia following cerebral infarction: Secondary | ICD-10-CM | POA: Diagnosis not present

## 2019-12-09 DIAGNOSIS — R1312 Dysphagia, oropharyngeal phase: Secondary | ICD-10-CM | POA: Diagnosis not present

## 2019-12-09 DIAGNOSIS — R32 Unspecified urinary incontinence: Secondary | ICD-10-CM | POA: Diagnosis not present

## 2019-12-09 DIAGNOSIS — I69354 Hemiplegia and hemiparesis following cerebral infarction affecting left non-dominant side: Secondary | ICD-10-CM | POA: Diagnosis not present

## 2019-12-09 DIAGNOSIS — D649 Anemia, unspecified: Secondary | ICD-10-CM | POA: Diagnosis not present

## 2019-12-10 ENCOUNTER — Other Ambulatory Visit: Payer: Self-pay

## 2019-12-10 ENCOUNTER — Encounter: Payer: Self-pay | Admitting: Adult Health

## 2019-12-10 ENCOUNTER — Ambulatory Visit: Payer: Medicare HMO | Admitting: Adult Health

## 2019-12-10 VITALS — BP 144/88 | HR 111 | Temp 97.5°F | Ht 70.5 in | Wt 157.4 lb

## 2019-12-10 DIAGNOSIS — I69354 Hemiplegia and hemiparesis following cerebral infarction affecting left non-dominant side: Secondary | ICD-10-CM

## 2019-12-10 DIAGNOSIS — I609 Nontraumatic subarachnoid hemorrhage, unspecified: Secondary | ICD-10-CM

## 2019-12-10 DIAGNOSIS — N400 Enlarged prostate without lower urinary tract symptoms: Secondary | ICD-10-CM | POA: Diagnosis not present

## 2019-12-10 DIAGNOSIS — N179 Acute kidney failure, unspecified: Secondary | ICD-10-CM | POA: Diagnosis not present

## 2019-12-10 DIAGNOSIS — I639 Cerebral infarction, unspecified: Secondary | ICD-10-CM | POA: Diagnosis not present

## 2019-12-10 DIAGNOSIS — N189 Chronic kidney disease, unspecified: Secondary | ICD-10-CM | POA: Diagnosis not present

## 2019-12-10 DIAGNOSIS — E785 Hyperlipidemia, unspecified: Secondary | ICD-10-CM | POA: Diagnosis not present

## 2019-12-10 DIAGNOSIS — I10 Essential (primary) hypertension: Secondary | ICD-10-CM | POA: Diagnosis not present

## 2019-12-10 DIAGNOSIS — D631 Anemia in chronic kidney disease: Secondary | ICD-10-CM | POA: Diagnosis not present

## 2019-12-10 DIAGNOSIS — I770 Arteriovenous fistula, acquired: Secondary | ICD-10-CM | POA: Diagnosis not present

## 2019-12-10 MED ORDER — ATORVASTATIN CALCIUM 40 MG PO TABS: 40.0000 mg | ORAL_TABLET | Freq: Every day | ORAL | 3 refills | Status: DC

## 2019-12-10 NOTE — Patient Instructions (Signed)
We will check a CT head for follow up regarding prior bleed - if resolved, will recommend restarting aspirin 81mg  daily  Continue lipitor  for secondary stroke prevention  Continue to follow up with PCP regarding cholesterol and blood pressure management   Continue to monitor blood pressure at home  Continue to work with home health therapies and will likely need to transition to outpatient therapy once completed   Maintain strict control of hypertension with blood pressure goal below 130/90, diabetes with hemoglobin A1c goal below 6.5% and cholesterol with LDL cholesterol (bad cholesterol) goal below 70 mg/dL. I also advised the patient to eat a healthy diet with plenty of whole grains, cereals, fruits and vegetables, exercise regularly and maintain ideal body weight.  Followup in the future with me in 3 months or call earlier if needed       Thank you for coming to see [place] at [facility]. I hope we have been able to provide you high quality care today.  You may receive a patient satisfaction survey over the next few weeks. We would appreciate your feedback and comments so that we may continue to improve ourselves and the health of our patients.

## 2019-12-10 NOTE — Progress Notes (Signed)
[facility] [address] [place]. [place] [address] [phone]       HOSPITAL FOLLOW UP NOTE  [name] Date of Birth:  August 29, 1951 Medical Record Number:  [id number]   Reason for Referral:  hospital stroke follow up    CHIEF COMPLAINT:  Chief Complaint  Patient presents with  . Follow-up    Rm9. With wife. Doing well. Having tremors in left arm.    HPI: [name] being seen today for in office hospital follow-up regarding bilateral cerebral cortex and right cerebellum stroke on 08/27/2019.  History obtained from patient, wife and chart review. Reviewed all radiology images and labs personally.  [name] is a 69 y.o. male with history of heartburn who was dx with COVID on 07/13/2019 presented to [facility] on 07/25/2019 for medical issues including VDRF, R pneumothorax, AKI requiring CRRT transition to HD, encephalopathy, MSSA bacteremia with negative echocardiogram, long-term intubation requiring tracheostomy on 08/22/2019 and episodes of A. fib with RVR treated with amiodarone.  Neurology service consulted on 08/27/2019 for altered mental status with MRI showing small acute/early subacute infarction bilateral cerebral cortex and right cerebellum and transferred to [facility] for further medical management.  Stroke etiology likely due to hypotension secondary to dialysis and sepsis with deconditioning as well as PAF possible etiology.  In addition to acute infarcts, MRI also showed bilateral frontal gyrus ribboning R>L likely due to hypoxia and hypotension consistent with anoxic brain injury.  LE venous Doppler and 2D echo unremarkable.  EEG diffuse encephalopathy but no seizure.  LDL 149 and initiated atorvastatin 40 mg daily.  A1c 6.1. initiated aspirin 325mg  daily and eventually decreased to 81mg  daily.  Complications of GI bleed with gastric ulcer status post injection and clipping therefore unable to fully anticoagulate with new onset atrial  fibrillation with RVR.  Other stroke risk factors include advanced age and EtOH use with no prior history of stroke.  Other active problems include AKI/CKD stage III, right pneumothorax/necrotic RLL not deemed to be a surgical candidate and pressure injuries to lip and buttocks. CRRT converted to HD on 11/3 with last HD on 12/24 with HD catheter removed and underwent left UE first stage basilic av fistula creation. Trach tube decannulated on 10/30. Advanced to regular diet no longer requiring TF.  Therapies recommended discharge to SNF but ultimately discharged to CIR.  He greatly improved during inpatient rehab and ultimately discharged home in stable condition on 11/07/2019.  [name] is a 69 year old male who is being seen today for stroke follow-up accompanied by wife. Residual deficits of left hemiparesis with LUE action tremors but endorses ongoing improvment.  He continues to work with home health therapies.  Ambulate short distance with rolling walker during therapy sessions otherwise transfers via wheelchair.  He unfortunately did have a fall on 11/27/2019 as he attempted to ambulate without assistance which led to subdural hematoma.  He has been doing well since that time and has not had any additional falls.  He does complain of mild headache since that time but are not debilitating.  He has not restarted aspirin since that time as it was recommended to discontinue with SDH.  For unknown reason, he is not currently on atorvastatin as wife was not aware he needed to be on this medication.  Blood pressure today 144/88.  Continues to follow closely with vascular surgery and does have evaluation by cardiology on 3/10.  No further stroke concerns at this time.     ROS:  14 system review of systems performed and negative with exception of cough, snoring, feeling cold, increased thirst, memory loss, confusion, headache, tremor and sleepiness  PMH:  Past Medical History:  Diagnosis Date  . COVID-19     . Heartburn     PSH:  Past Surgical History:  Procedure Laterality Date  . ACHILLES TENDON SURGERY Right 2012  . [place] TRANSPOSITION  10/13/2019   Procedure: Basilic Vein Transposition Left Arm;  Surgeon: Angelia Mould, MD;  Location: [facility] OR;  Service: Vascular;;  . COLONOSCOPY  2009   IH  . ESOPHAGOGASTRODUODENOSCOPY N/A 12/25/2014   Procedure: ESOPHAGOGASTRODUODENOSCOPY (EGD);  Surgeon: Danie Binder, MD;  Location: [facility] SUITE;  Service: Endoscopy;  Laterality: N/A;  830am  . ESOPHAGOGASTRODUODENOSCOPY N/A 08/09/2019   Procedure: ESOPHAGOGASTRODUODENOSCOPY (EGD);  Surgeon: Ronald Lobo, MD;  Location: [facility];  Service: Endoscopy;  Laterality: N/A;  Patient is already sedated, so I do not anticipate need for further sedation.  Okay from my standpoint to do either at the bedside or in the operating room  . HEMOSTASIS CLIP PLACEMENT  08/09/2019   Procedure: HEMOSTASIS CLIP PLACEMENT;  Surgeon: Ronald Lobo, MD;  Location: [facility] ENDOSCOPY;  Service: Endoscopy;;  . HEMOSTASIS CONTROL  08/09/2019   Procedure: HEMOSTASIS CONTROL;  Surgeon: Ronald Lobo, MD;  Location: [facility] ENDOSCOPY;  Service: Endoscopy;;  epi   . IR FLUORO GUIDE CV LINE LEFT  09/02/2019  . IR REMOVAL TUN CV CATH W/O FL  10/29/2019  . IR US GUIDE VASC ACCESS LEFT  09/02/2019  . KNEE SURGERY Left 1980's  . none    . WRIST SURGERY Left 1970's    Social History:  Social History   Socioeconomic History  . Marital status: Married    Spouse name: Not on file  . Number of children: Not on file  . Years of education: bachelors  . Highest education level: Not on file  Occupational History  . Occupation: Scientist, clinical (histocompatibility and immunogenetics): [employer]  Tobacco Use  . Smoking status: Never Smoker  . Smokeless tobacco: Never Used  Substance and Sexual Activity  . Alcohol use: Yes    Alcohol/week: 2.0 standard drinks    Types: 2 Shots of liquor per week    Comment: moderate  . Drug use: No  . Sexual  activity: Not on file  Other Topics Concern  . Not on file  Social History Narrative  . Not on file   Social Determinants of Health   Financial Resource Strain:   . Difficulty of Paying Living Expenses: Not on file  Food Insecurity:   . Worried About Charity fundraiser in the Last Year: Not on file  . Ran Out of Food in the Last Year: Not on file  Transportation Needs:   . Lack of Transportation (Medical): Not on file  . Lack of Transportation (Non-Medical): Not on file  Physical Activity:   . Days of Exercise per Week: Not on file  . Minutes of Exercise per Session: Not on file  Stress:   . Feeling of Stress : Not on file  Social Connections:   . Frequency of Communication with Friends and Family: Not on file  . Frequency of Social Gatherings with Friends and Family: Not on file  . Attends Religious Services: Not on file  . Active Member of Clubs or Organizations: Not on file  . Attends Archivist Meetings: Not on file  . Marital Status: Not on file  Intimate Partner Violence:   .  Fear of Current or Ex-Partner: Not on file  . Emotionally Abused: Not on file  . Physically Abused: Not on file  . Sexually Abused: Not on file    Family History:  Family History  Problem Relation Age of Onset  . Colon cancer Neg Hx   . Colon polyps Neg Hx     Medications:   Current Outpatient Medications on File Prior to Visit  Medication Sig Dispense Refill  . diclofenac Sodium (VOLTAREN) 1 % GEL Apply 2 g topically 4 (four) times daily. 2 g 0  . Nutritional Supplements (FEEDING SUPPLEMENT, NEPRO CARB STEADY,) LIQD Take 237 mLs by mouth 3 (three) times daily between meals. 1000 mL 0   No current facility-administered medications on file prior to visit.    Allergies:   Allergies  Allergen Reactions  . Hydrocodone-Acetaminophen     Disorientation/confusion     Physical Exam  Vitals:   12/10/19 1005  BP: (!) 144/88  Pulse: (!) 111  Temp: (!) 97.5 F (36.4 C)    Weight: 157 lb 6.4 oz (71.4 kg)  Height: 5' 10.5" (1.791 m)   Body mass index is 22.27 kg/m. No exam data present  No flowsheet data found.   General: well developed, well nourished,  pleasant middle-aged African-American male, seated, in no evident distress Head: head normocephalic and atraumatic.   Neck: supple with no carotid or supraclavicular bruits Cardiovascular: regular rate and rhythm, no murmurs Musculoskeletal: no deformity Skin:  no rash/petichiae Vascular:  Normal pulses all extremities   Neurologic Exam Mental Status: Awake and fully alert.   Normal speech and language.  Oriented to place and time. Recent and remote memory intact. Attention span, concentration and fund of knowledge appropriate. Mood and affect appropriate.  Cranial Nerves: Fundoscopic exam reveals sharp disc margins. Pupils equal, briskly reactive to light. Extraocular movements full without nystagmus. Visual fields full to confrontation. Hearing intact. Facial sensation intact. Face, tongue, palate moves normally and symmetrically.  Motor:  LUE: 4/5 with weak grip strength and increased spasticity LLE: 4/5 with weak ankle dorsiflexion and use of AFO brace Full strength right upper and lower extremity  Sensory.: intact to touch , pinprick , position and vibratory sensation.  Coordination: Rapid alternating movements normal in all extremities except decreased left hand with moderate action tremors which resolves with pressure. Finger-to-nose and heel-to-shin performed accurately on right side. Gait and Station: Deferred as rolling walker not present during visit Reflexes: 1+ and symmetric. Toes downgoing.     NIHSS  4 Modified Rankin  3    Diagnostic Data (Labs, Imaging, Testing)   MRI 11/3 small R cerebellar infarcts, watershed area between SCA and PICA territory.  LE Doppler 10/8 negative for DVT  2D Echo 10/19 EF 60-65%. No source of embolus   EEG diffuse encephalopathy, no sz   Do not  think need further cerebral vessel imaging at this time, as it will not change management.  LDL 149  HgbA1c 6.1   ASSESSMENT: [name] is a 69 y.o. year old male found to have right cerebellar small infarct at border zone of SCA and PICA on 08/25/2019 likely due to hypotension secondary to dialysis, sepsis and deconditioning during prolonged hospitalization with [id number] complications from 37/6/28-31/51/76.  Also sustained a subdural hematoma post fall on 11/27/2019.  Vascular risk factors include new onset PAF during admission, CKD, HLD and HTN.  He has been recovering well with residual left hemiparesis    PLAN:  1. Right cerebellar stroke: Advised to restart  atorvastatin 40 mg daily for secondary stroke prevention.  Aspirin placed on hold after recent traumatic SDH -we will repeat CT head in 2 weeks and if resolved, will recommend restarting aspirin 81 mg daily for secondary stroke prevention.  Maintain strict control of hypertension with blood pressure goal below 130/90, diabetes with hemoglobin A1c goal below 6.5% and cholesterol with LDL cholesterol (bad cholesterol) goal below 70 mg/dL.  I also advised the patient to eat a healthy diet with plenty of whole grains, cereals, fruits and vegetables, exercise regularly with at least 30 minutes of continuous activity daily and maintain ideal body weight. 2. HTN: Advised to continue current treatment regimen.  Today's BP 144//88.  Advised to continue to monitor at home along with continued follow-up with PCP for management 3. HLD: Advised to continue current treatment regimen along with continued follow-up with PCP for future prescribing and monitoring of lipid panel 4. New dx of A. Fib: Found during recent admission.  At this time, not a candidate for anticoagulation but advised to continue to follow with cardiology for further determination 5. Left hemiparesis, poststroke: Continuation of home health therapies for ongoing improvement and will  likely need to transition to outpatient therapy once completed    Follow up in 3 months or call earlier if needed   Greater than 50% of time during this 45 minute visit was spent on counseling, explanation of diagnosis of right cerebellar stroke, reviewing risk factor management of HTN and HLD, discussion regarding residual deficits, planning of further management along with potential future management, and discussion with patient and family answering all questions.    Frann Rider, AGNP-BC  [facility] [address] [place], [place] [address]  Phone [phone] Fax [phone] Note: This document was prepared with digital dictation and possible smart phrase technology. Any transcriptional errors that result from this process are unintentional.

## 2019-12-11 NOTE — Progress Notes (Signed)
I agree with the above plan 

## 2019-12-12 DIAGNOSIS — Z8616 Personal history of COVID-19: Secondary | ICD-10-CM | POA: Diagnosis not present

## 2019-12-12 DIAGNOSIS — R32 Unspecified urinary incontinence: Secondary | ICD-10-CM | POA: Diagnosis not present

## 2019-12-12 DIAGNOSIS — K219 Gastro-esophageal reflux disease without esophagitis: Secondary | ICD-10-CM | POA: Diagnosis not present

## 2019-12-12 DIAGNOSIS — K5900 Constipation, unspecified: Secondary | ICD-10-CM | POA: Diagnosis not present

## 2019-12-12 DIAGNOSIS — D649 Anemia, unspecified: Secondary | ICD-10-CM | POA: Diagnosis not present

## 2019-12-12 DIAGNOSIS — R1312 Dysphagia, oropharyngeal phase: Secondary | ICD-10-CM | POA: Diagnosis not present

## 2019-12-12 DIAGNOSIS — I69354 Hemiplegia and hemiparesis following cerebral infarction affecting left non-dominant side: Secondary | ICD-10-CM | POA: Diagnosis not present

## 2019-12-12 DIAGNOSIS — I69391 Dysphagia following cerebral infarction: Secondary | ICD-10-CM | POA: Diagnosis not present

## 2019-12-12 DIAGNOSIS — L89154 Pressure ulcer of sacral region, stage 4: Secondary | ICD-10-CM | POA: Diagnosis not present

## 2019-12-15 DIAGNOSIS — I69354 Hemiplegia and hemiparesis following cerebral infarction affecting left non-dominant side: Secondary | ICD-10-CM | POA: Diagnosis not present

## 2019-12-15 DIAGNOSIS — I69391 Dysphagia following cerebral infarction: Secondary | ICD-10-CM | POA: Diagnosis not present

## 2019-12-15 DIAGNOSIS — K219 Gastro-esophageal reflux disease without esophagitis: Secondary | ICD-10-CM | POA: Diagnosis not present

## 2019-12-15 DIAGNOSIS — R1312 Dysphagia, oropharyngeal phase: Secondary | ICD-10-CM | POA: Diagnosis not present

## 2019-12-15 DIAGNOSIS — K5900 Constipation, unspecified: Secondary | ICD-10-CM | POA: Diagnosis not present

## 2019-12-15 DIAGNOSIS — R32 Unspecified urinary incontinence: Secondary | ICD-10-CM | POA: Diagnosis not present

## 2019-12-15 DIAGNOSIS — D649 Anemia, unspecified: Secondary | ICD-10-CM | POA: Diagnosis not present

## 2019-12-15 DIAGNOSIS — Z8616 Personal history of COVID-19: Secondary | ICD-10-CM | POA: Diagnosis not present

## 2019-12-15 DIAGNOSIS — L89154 Pressure ulcer of sacral region, stage 4: Secondary | ICD-10-CM | POA: Diagnosis not present

## 2019-12-15 IMAGING — CT CT CHEST W/O CM
2 of 4 series · 15 of 36 positions shown, 18 images · non-contrast
Comparison: Chest CT dated 09/01/2019.

CLINICAL DATA: 67-year-old male with shortness of breath and
pneumonia. Patient was found to have [id number]. Patient presenting
with rales.

EXAM:
CT CHEST WITHOUT CONTRAST
TECHNIQUE: Multidetector CT imaging of the chest was performed following the
standard protocol without IV contrast.

[Series 3: chest wo · axial · 0.80mm/px · z∈[-39,+259]mm · 12 of 177 slices shown, 15 images]
[im 14/177  mediastinal]
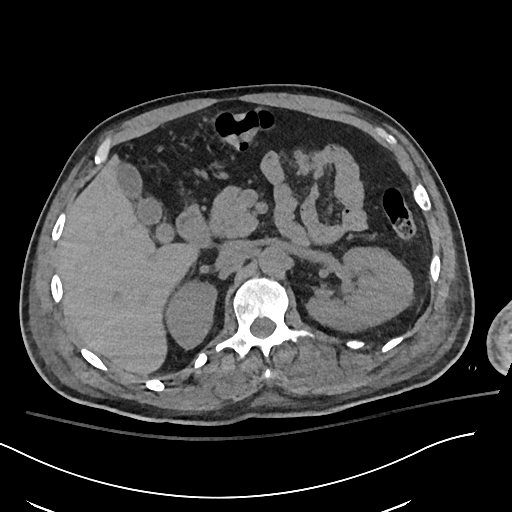
[im 14/177  lung]
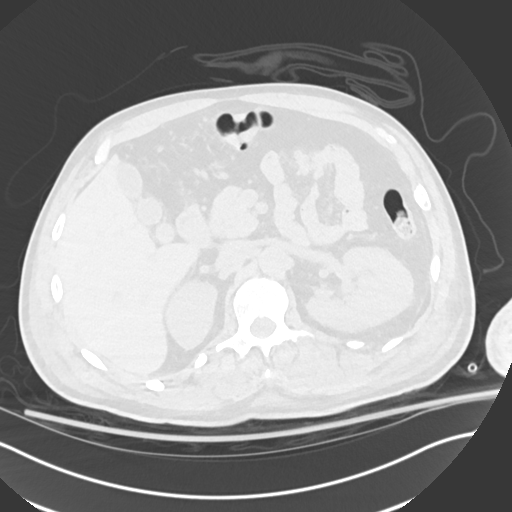
[im 28/177  lung]
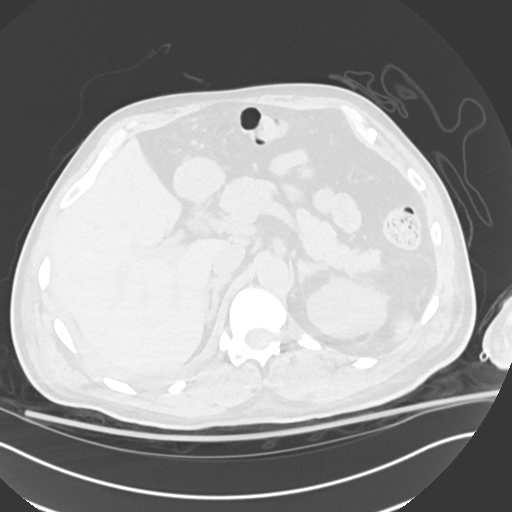
[im 41/177  lung]
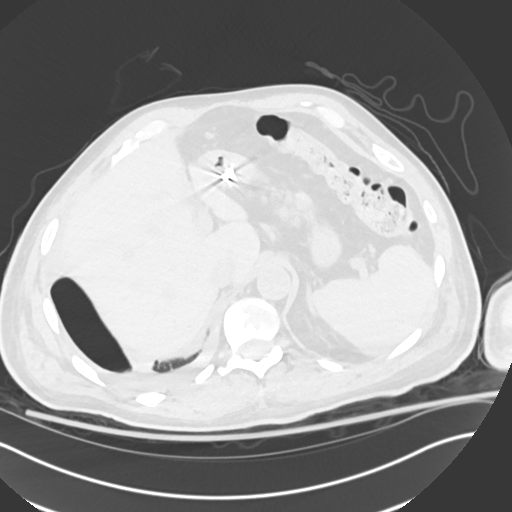
[im 55/177  lung]
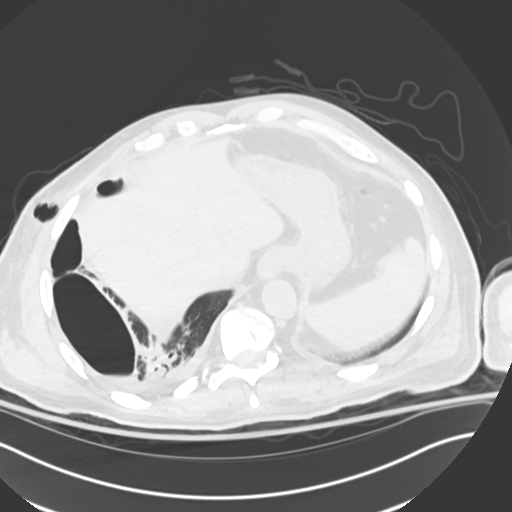
[im 68/177  mediastinal]
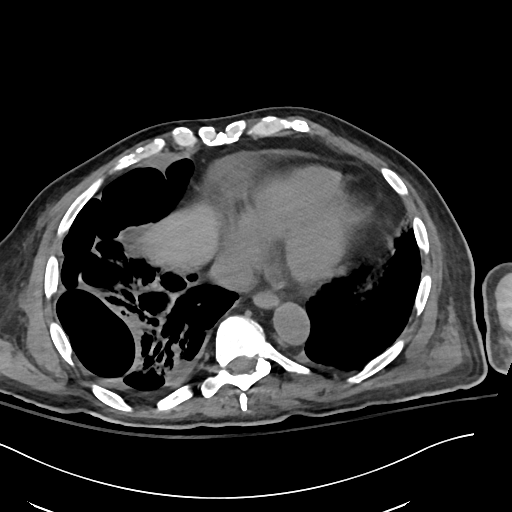
[im 68/177  lung]
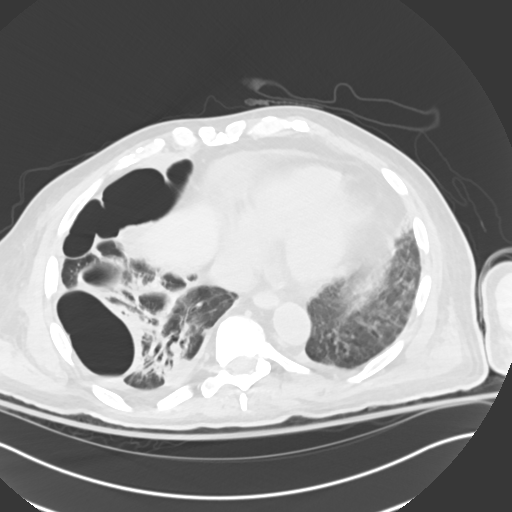
[im 82/177  lung]
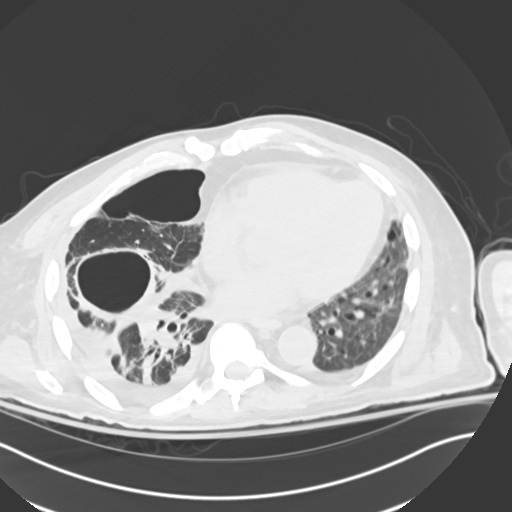
[im 95/177  lung]
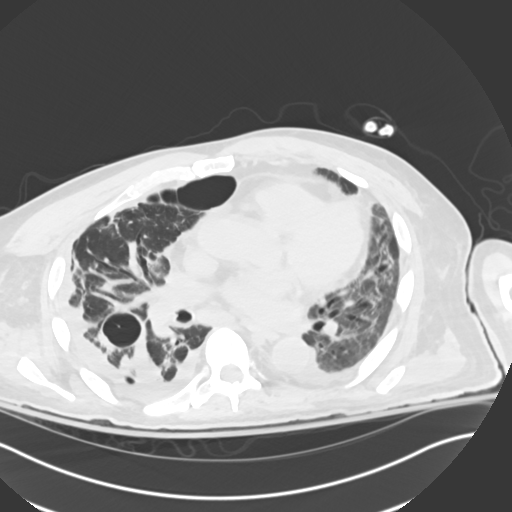
[im 109/177  lung]
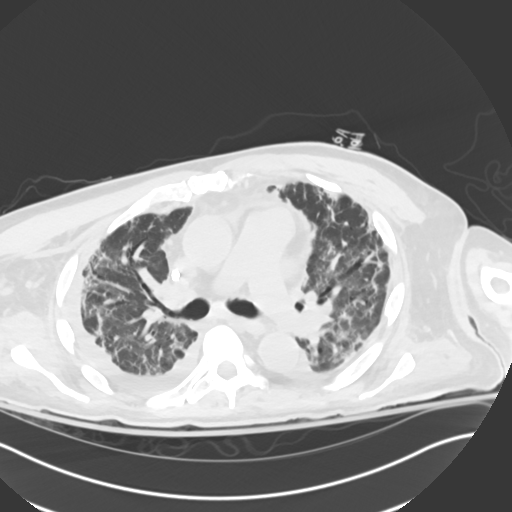
[im 122/177  mediastinal]
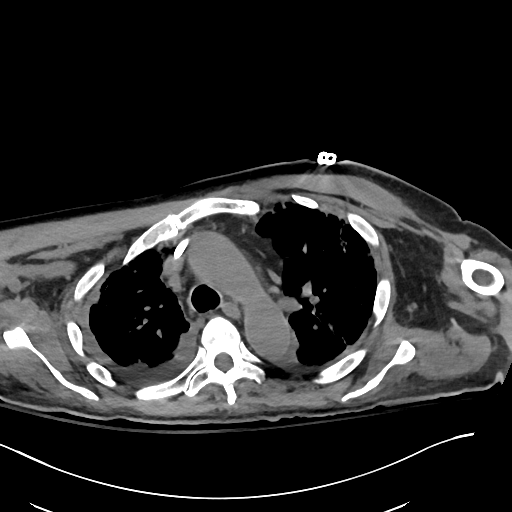
[im 122/177  lung]
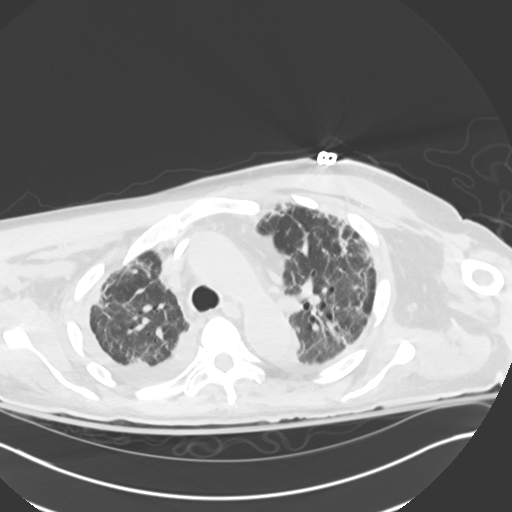
[im 136/177  lung]
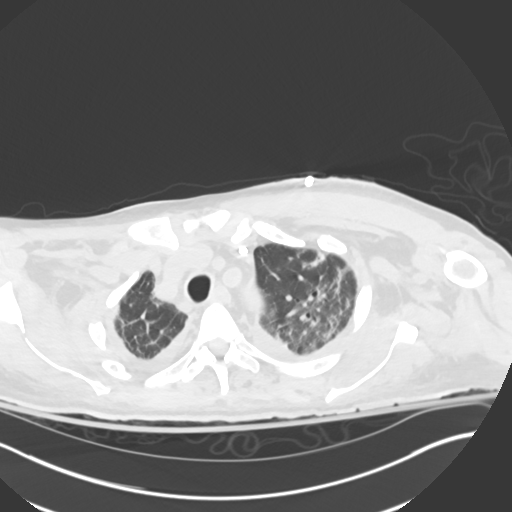
[im 149/177  lung]
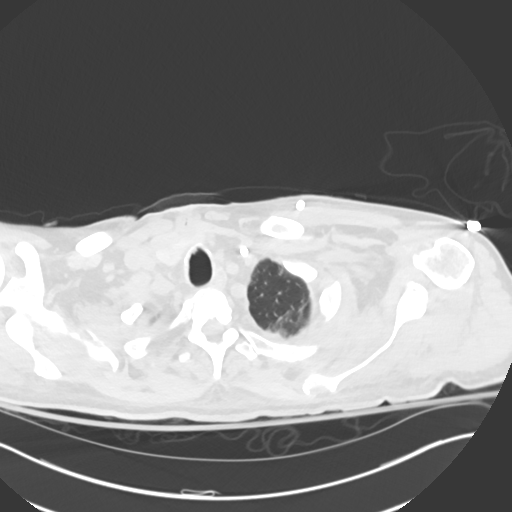
[im 163/177  lung]
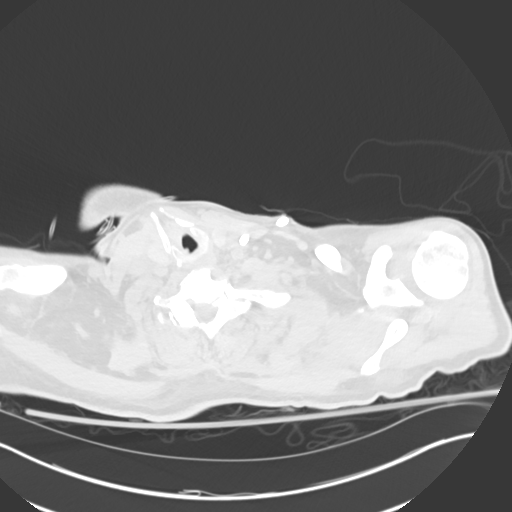

[Series 6: cor · coronal · 0.74mm/px · 3 of 144 slices shown]
[im 29/144  lung]
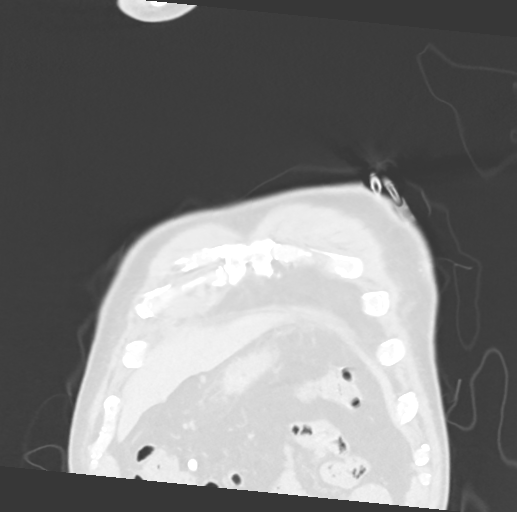
[im 58/144  lung]
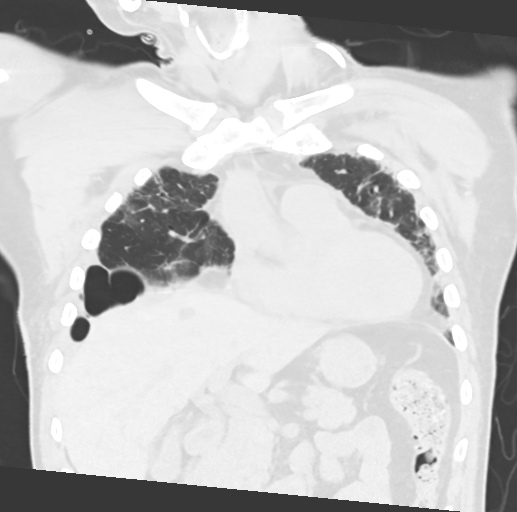
[im 86/144  lung]
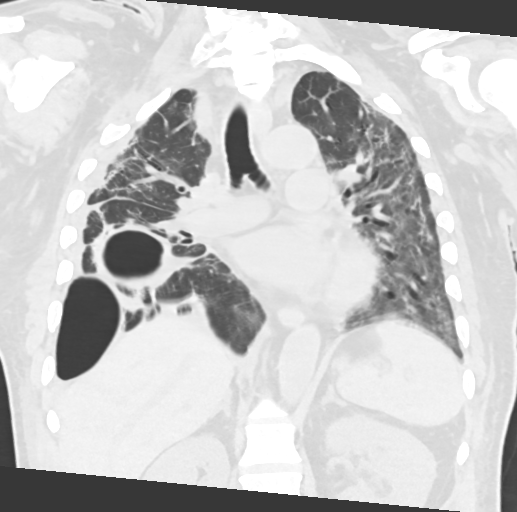

[15 of 36 positions shown; findings below may reference images not displayed]

FINDINGS: Evaluation of this exam is limited in the absence of intravenous
contrast.

Cardiovascular: There is mild cardiomegaly. No significant
pericardial effusion. There is hypoattenuation of the cardiac blood
pool suggestive of a degree of anemia. Clinical correlation is
recommended. The thoracic aorta and central pulmonary arteries are
grossly unremarkable.

Mediastinum/Nodes: No hilar or mediastinal adenopathy. The esophagus
is grossly unremarkable. Left IJ dialysis catheter with tip over
central SVC.

Lungs/Pleura: Small bilateral pleural effusions, increased since the
prior CT. There has been interval removal of the right-sided chest
tube with increased air within the right pleural space. A tract seen
in the right lateral chest wall corresponding to the location of the
removed chest tube. There appears to be communication of air between
the pleural space and chest wall. This likely results in
introduction of air into the right pleural space. Clinical
correlation is recommended. Diffuse interstitial linear and streaky
densities with mild bilateral bronchiectasis. Overall improvement in
aeration of the lungs and decrease in the diffuse airspace
ground-glass density compared to the prior CT. A 5 x 6 cm cystic
area in the right middle lobe has decreased in size since the prior
CT (previously measuring approximately 10 x 8 cm). There has been
interval removal of the tracheostomy.

Upper Abdomen: A 1 cm hypodense lesion in the dome of the liver is
not well characterized but likely represent cysts. There is metallic
density in the distal stomach similar to prior CT which may
represent surgical material or ingested foreign matter.

Musculoskeletal: No chest wall mass or suspicious bone lesions
identified.
IMPRESSION: 1. Interval removal of the right-sided chest tube with increased air
within the right pleural space. There appears to be communication of
air between the pleural space and chest wall along the track of the
removed chest tube resulting in introduction of air into the right
pleural space. Clinical correlation is recommended.
2. Interval decrease in the size of the right middle lobe cystic
area compared to the prior CT.
3. Small bilateral pleural effusions, increased since the prior CT.
4. Overall improvement in the aeration of the lungs and diffuse
airspace ground-glass density compared to the prior CT.
5. Mild cardiomegaly.
6. Aortic Atherosclerosis ([id number]).

## 2019-12-17 ENCOUNTER — Telehealth: Payer: Self-pay | Admitting: Family Medicine

## 2019-12-17 DIAGNOSIS — R1312 Dysphagia, oropharyngeal phase: Secondary | ICD-10-CM | POA: Diagnosis not present

## 2019-12-17 DIAGNOSIS — R32 Unspecified urinary incontinence: Secondary | ICD-10-CM | POA: Diagnosis not present

## 2019-12-17 DIAGNOSIS — Z8616 Personal history of COVID-19: Secondary | ICD-10-CM | POA: Diagnosis not present

## 2019-12-17 DIAGNOSIS — L89154 Pressure ulcer of sacral region, stage 4: Secondary | ICD-10-CM | POA: Diagnosis not present

## 2019-12-17 DIAGNOSIS — Z7689 Persons encountering health services in other specified circumstances: Secondary | ICD-10-CM

## 2019-12-17 DIAGNOSIS — K5900 Constipation, unspecified: Secondary | ICD-10-CM | POA: Diagnosis not present

## 2019-12-17 DIAGNOSIS — I69354 Hemiplegia and hemiparesis following cerebral infarction affecting left non-dominant side: Secondary | ICD-10-CM | POA: Diagnosis not present

## 2019-12-17 DIAGNOSIS — I69391 Dysphagia following cerebral infarction: Secondary | ICD-10-CM | POA: Diagnosis not present

## 2019-12-17 DIAGNOSIS — K219 Gastro-esophageal reflux disease without esophagitis: Secondary | ICD-10-CM | POA: Diagnosis not present

## 2019-12-17 DIAGNOSIS — D649 Anemia, unspecified: Secondary | ICD-10-CM | POA: Diagnosis not present

## 2019-12-17 NOTE — Telephone Encounter (Signed)
Go ahead with ENT referral for [facility]  Also patient will need a handicap placard card please fill inform I will sign thank you

## 2019-12-17 NOTE — Telephone Encounter (Signed)
[facility] Nurse is requesting a referral to ENT to evaluate patient throat to see if he is ready for speech therapy.Please advise Alla Feeling) [phone]

## 2019-12-18 ENCOUNTER — Telehealth: Payer: Self-pay | Admitting: Adult Health

## 2019-12-18 NOTE — Telephone Encounter (Signed)
[name]: [id number] (exp. 12/18/19 to 01/17/20) order sent to GI. They will reach out to the patient to schedule.

## 2019-12-18 NOTE — Telephone Encounter (Signed)
ENT referral placed;  disability place card filled out. Left message for Alla Feeling to return call to inform her of ENT referral.

## 2019-12-19 ENCOUNTER — Telehealth: Payer: Self-pay | Admitting: *Deleted

## 2019-12-19 DIAGNOSIS — I69354 Hemiplegia and hemiparesis following cerebral infarction affecting left non-dominant side: Secondary | ICD-10-CM | POA: Diagnosis not present

## 2019-12-19 DIAGNOSIS — D649 Anemia, unspecified: Secondary | ICD-10-CM | POA: Diagnosis not present

## 2019-12-19 DIAGNOSIS — R1312 Dysphagia, oropharyngeal phase: Secondary | ICD-10-CM | POA: Diagnosis not present

## 2019-12-19 DIAGNOSIS — L89154 Pressure ulcer of sacral region, stage 4: Secondary | ICD-10-CM | POA: Diagnosis not present

## 2019-12-19 DIAGNOSIS — I69391 Dysphagia following cerebral infarction: Secondary | ICD-10-CM | POA: Diagnosis not present

## 2019-12-19 DIAGNOSIS — K5900 Constipation, unspecified: Secondary | ICD-10-CM | POA: Diagnosis not present

## 2019-12-19 DIAGNOSIS — R32 Unspecified urinary incontinence: Secondary | ICD-10-CM | POA: Diagnosis not present

## 2019-12-19 DIAGNOSIS — K219 Gastro-esophageal reflux disease without esophagitis: Secondary | ICD-10-CM | POA: Diagnosis not present

## 2019-12-19 DIAGNOSIS — Z8616 Personal history of COVID-19: Secondary | ICD-10-CM | POA: Diagnosis not present

## 2019-12-19 NOTE — Telephone Encounter (Signed)
[name] from [facility] called because she was just there to do wound care and wife told her that dr Wolfgang Phoenix told her  has not received any reports from them about how his wound was doing. Wants to know if dr Wolfgang Phoenix wants a call every week or what information he wants. They do measurements every week and pictures once a month. States pt was doing good today while she was there. Wound is slowly healing. States wife was getting anxious because its not healed but was told a wound that deep takes awhile to heal.   [phone]

## 2019-12-21 ENCOUNTER — Encounter: Payer: Self-pay | Admitting: Family Medicine

## 2019-12-21 DIAGNOSIS — N184 Chronic kidney disease, stage 4 (severe): Secondary | ICD-10-CM

## 2019-12-21 HISTORY — DX: Chronic kidney disease, stage 4 (severe): N18.4

## 2019-12-22 ENCOUNTER — Other Ambulatory Visit: Payer: Self-pay

## 2019-12-22 ENCOUNTER — Ambulatory Visit: Payer: Medicare HMO | Admitting: Family Medicine

## 2019-12-22 ENCOUNTER — Telehealth: Payer: Self-pay | Admitting: Family Medicine

## 2019-12-22 DIAGNOSIS — I69391 Dysphagia following cerebral infarction: Secondary | ICD-10-CM | POA: Diagnosis not present

## 2019-12-22 DIAGNOSIS — K5900 Constipation, unspecified: Secondary | ICD-10-CM | POA: Diagnosis not present

## 2019-12-22 DIAGNOSIS — I69354 Hemiplegia and hemiparesis following cerebral infarction affecting left non-dominant side: Secondary | ICD-10-CM

## 2019-12-22 DIAGNOSIS — D649 Anemia, unspecified: Secondary | ICD-10-CM | POA: Diagnosis not present

## 2019-12-22 DIAGNOSIS — L89154 Pressure ulcer of sacral region, stage 4: Secondary | ICD-10-CM | POA: Diagnosis not present

## 2019-12-22 DIAGNOSIS — R32 Unspecified urinary incontinence: Secondary | ICD-10-CM | POA: Diagnosis not present

## 2019-12-22 DIAGNOSIS — L89159 Pressure ulcer of sacral region, unspecified stage: Secondary | ICD-10-CM | POA: Diagnosis not present

## 2019-12-22 DIAGNOSIS — K219 Gastro-esophageal reflux disease without esophagitis: Secondary | ICD-10-CM | POA: Diagnosis not present

## 2019-12-22 DIAGNOSIS — Z8616 Personal history of COVID-19: Secondary | ICD-10-CM | POA: Diagnosis not present

## 2019-12-22 DIAGNOSIS — R1312 Dysphagia, oropharyngeal phase: Secondary | ICD-10-CM | POA: Diagnosis not present

## 2019-12-22 DIAGNOSIS — S31000D Unspecified open wound of lower back and pelvis without penetration into retroperitoneum, subsequent encounter: Secondary | ICD-10-CM

## 2019-12-22 MED ORDER — CEPHALEXIN 500 MG PO CAPS: 500.0000 mg | ORAL_CAPSULE | Freq: Four times a day (QID) | ORAL | 0 refills | Status: DC

## 2019-12-22 MED ORDER — TRIAMCINOLONE ACETONIDE 0.1 % EX CREA: TOPICAL_CREAM | CUTANEOUS | 4 refills | Status: DC

## 2019-12-22 NOTE — Progress Notes (Signed)
   Subjective:    Patient ID: [name], male    DOB: 12/13/1950, 69 y.o.   MRN: [id number]  HPI home visit to check wound.  Wound visit Time spent with patient 25 minutes both in preparation of the chart and visiting in documentation Patient with a large wound that is gradually healing in giving him difficulty Recently has had increased mucus drainage but no tenderness or pain home health nurse comes out to help with packing wife helps with packing  Review of Systems No major setbacks denies fever cough wheezing difficulty breathing    Objective:   Physical Exam  Lungs clear respiratory rate normal heart regular no murmurs sacral wound is noted this is from a pressure sore it is gradually healing in      Assessment & Plan:  Wound check Mild infection no Antibiotics recommended  Itching skin recommend humidifier lotion as well as triamcinolone

## 2019-12-22 NOTE — Telephone Encounter (Signed)
Wife states she notified blood on packing when dressing wound. Dr Nicki Reaper notified and will do home visit this afternoon- wife verbalized understanding.

## 2019-12-22 NOTE — Telephone Encounter (Signed)
Wife notified.

## 2019-12-22 NOTE — Telephone Encounter (Signed)
Wants to ask the nurse a question on his wound, also when can he take the covid vaccine. Also checking on the status of the ENT referral.

## 2019-12-23 NOTE — Telephone Encounter (Signed)
Leda Gauze from [facility] notified and verbalized understanding.

## 2019-12-23 NOTE — Telephone Encounter (Signed)
Please let the wound care nurse know that I appreciate their help and they are concern  Please let them know that I did do a home visit on the patient yesterday and had some mild infection of that area I placed him on Keflex for 10 days  Should they have further troubles or problems let us know thanks

## 2019-12-29 DIAGNOSIS — I69354 Hemiplegia and hemiparesis following cerebral infarction affecting left non-dominant side: Secondary | ICD-10-CM | POA: Diagnosis not present

## 2019-12-29 DIAGNOSIS — K219 Gastro-esophageal reflux disease without esophagitis: Secondary | ICD-10-CM | POA: Diagnosis not present

## 2019-12-29 DIAGNOSIS — I69391 Dysphagia following cerebral infarction: Secondary | ICD-10-CM | POA: Diagnosis not present

## 2019-12-29 DIAGNOSIS — R32 Unspecified urinary incontinence: Secondary | ICD-10-CM | POA: Diagnosis not present

## 2019-12-29 DIAGNOSIS — Z8616 Personal history of COVID-19: Secondary | ICD-10-CM | POA: Diagnosis not present

## 2019-12-29 DIAGNOSIS — D649 Anemia, unspecified: Secondary | ICD-10-CM | POA: Diagnosis not present

## 2019-12-29 DIAGNOSIS — L89154 Pressure ulcer of sacral region, stage 4: Secondary | ICD-10-CM | POA: Diagnosis not present

## 2019-12-29 DIAGNOSIS — K5900 Constipation, unspecified: Secondary | ICD-10-CM | POA: Diagnosis not present

## 2019-12-29 DIAGNOSIS — R1312 Dysphagia, oropharyngeal phase: Secondary | ICD-10-CM | POA: Diagnosis not present

## 2019-12-29 NOTE — Progress Notes (Addendum)
POST OPERATIVE OFFICE NOTE    CC:  F/u for surgery  HPI:  This is a 69 y.o. male who is s/p left 1st stage BVT on 10/13/2019 by Dr. Scot Dock.  He was last seen on 11/26/2019 and at that time, his fistula had not matured and was thought to maybe have a central venous stenosis. He recently has  had some kidney function improvement and is not currently requiring HD. His TDC has been removed. He is here today for repeat duplex study to see if the fistula has matured more.   The pt does not have evidence of steal sx.Swelling that he previously had in left upper extremity is now resolved. Denies any pain, coldness, or new weakness of left upper extremity. The pt is not currently on dialysis. He is not sure when he has a follow up with Nephrologist Dr. Justin Mend. His last labs on 11/07/19 showed Cr. 2.67 /GFR 27.  Pt does have a sacral wound that is slow healing.  He was seen by his PCP on 12/23/2019 and placed on Keflex for 10 day course. He continues to have this followed by his PCP  Pt was in ER on 11/27/2018 for fall and had subdural hematoma. Due to subdural hematoma he is no longer taking Aspirin and Plavix. He has hx of Covid in September 2020.  He had negative covid test 08/25/2019. He additionally has hx of bilateral cerebral cortex and right cerebellum small acute to subacute infarcts with left sided weakness in December 2020.   Allergies  Allergen Reactions  . Hydrocodone-Acetaminophen     Disorientation/confusion    Current Outpatient Medications  Medication Sig Dispense Refill  . atorvastatin (LIPITOR) 40 MG tablet Take 1 tablet (40 mg total) by mouth daily at 6 PM. 30 tablet 3  . cephALEXin (KEFLEX) 500 MG capsule Take 1 capsule (500 mg total) by mouth 4 (four) times daily. 40 capsule 0  . diclofenac Sodium (VOLTAREN) 1 % GEL Apply 2 g topically 4 (four) times daily. 2 g 0  . Nutritional Supplements (FEEDING SUPPLEMENT, NEPRO CARB STEADY,) LIQD Take 237 mLs by mouth 3 (three) times daily between  meals. 1000 mL 0  . triamcinolone cream (KENALOG) 0.1 % Apply to affected area of itching 4 times daily as needed 45 g 4   No current facility-administered medications for this visit.     ROS:  Review of Systems  Constitutional: Negative for chills, fever and malaise/fatigue.  HENT: Negative for congestion and sore throat.   Respiratory: Negative for cough and shortness of breath.   Cardiovascular: Negative for chest pain, palpitations, claudication and leg swelling.  Gastrointestinal: Negative for abdominal pain, constipation, diarrhea, nausea and vomiting.  Genitourinary: Positive for frequency. Negative for dysuria and flank pain.  Musculoskeletal: Negative for myalgias.  Neurological: Positive for weakness (left upper and lower extremities secondary to CVA). Negative for dizziness, sensory change, speech change and headaches.    Blood pressure 125/78, pulse (!) 115, temperature 98.8 F (37.1 C), temperature source Oral, resp. rate 18, height 5' 10.5" (1.791 m), weight 154 lb (69.9 kg), SpO2 96 %.  Physical Exam: General: well appearing, well nourished, not in any discomfort Lungs: clear to auscultation bilaterally, non labored Heart: tachycardic, without murmur Incision:  Left distal upper arm healed Extremities:  There 2+ a palpable radial and ulnar pulses.  Motor and sensory in tact.  There is a good thrill/bruit present.   Dialysis Duplex on 11/26/2019: Diameter:  0.31cm-0.42cm Depth:  1.03cm-1.30cm  Dialysis duplex on  12/31/2019: Diameter:  .24 cm -.69 cm Depth:  .62cm - .84 cm   Assessment/Plan:  This is a 69 y.o. male who is s/p: 1st stage left BVT on 10/13/2019 by Dr. Scot Dock that was not maturing adequately and presents today for follow up duplex. The fistula has matured more since last visit however still has not matured enough and is too deep in the left upper arm for access. His swelling as resolved so I do not suspect any central venous stenosis at this  time.  -the pt does not have evidence of steal. - He will need a second stage BV transposition. He presently however is not eager to undergo surgery as his renal function has improved. I did however discuss with him that this may be temporary and that the healing process takes time following a second stage and so it may be best to proceed however he would like to wait at this time - I discussed that when he follows up with Dr. Justin Mend if there are concerns of worsening renal function he will need to call to follow up earlier - I have encouraged him to increase his LUE exercises/ PT as he has residual left sided weakness following his CVA - will have him return in 8-12 weeks for repeat fistula duplex and exam   Paulo Fruit, PA-C [facility] [phone]  Clinic MD:  Scot Dock

## 2019-12-31 ENCOUNTER — Ambulatory Visit (INDEPENDENT_AMBULATORY_CARE_PROVIDER_SITE_OTHER): Payer: Self-pay | Admitting: Physician Assistant

## 2019-12-31 ENCOUNTER — Other Ambulatory Visit: Payer: Self-pay

## 2019-12-31 ENCOUNTER — Encounter: Payer: Self-pay | Admitting: Physician Assistant

## 2019-12-31 ENCOUNTER — Ambulatory Visit (HOSPITAL_COMMUNITY)
Admission: RE | Admit: 2019-12-31 | Discharge: 2019-12-31 | Disposition: A | Discharge status: Home / Self Care | Payer: Medicare HMO | Source: Ambulatory Visit | Attending: Vascular Surgery | Admitting: Vascular Surgery

## 2019-12-31 VITALS — BP 125/78 | HR 115 | Temp 98.8°F | Resp 18 | Ht 70.5 in | Wt 154.0 lb

## 2019-12-31 DIAGNOSIS — N184 Chronic kidney disease, stage 4 (severe): Secondary | ICD-10-CM

## 2020-01-01 ENCOUNTER — Encounter: Payer: Self-pay | Admitting: Family Medicine

## 2020-01-01 ENCOUNTER — Other Ambulatory Visit: Payer: Self-pay | Admitting: *Deleted

## 2020-01-01 DIAGNOSIS — N184 Chronic kidney disease, stage 4 (severe): Secondary | ICD-10-CM

## 2020-01-02 ENCOUNTER — Encounter: Payer: Self-pay | Admitting: Physical Medicine & Rehabilitation

## 2020-01-02 ENCOUNTER — Other Ambulatory Visit: Payer: Self-pay

## 2020-01-02 ENCOUNTER — Encounter: Payer: Medicare HMO | Attending: Physical Medicine & Rehabilitation | Admitting: Physical Medicine & Rehabilitation

## 2020-01-02 VITALS — BP 125/77 | HR 124 | Temp 97.7°F | Ht 70.5 in | Wt 155.0 lb

## 2020-01-02 DIAGNOSIS — R32 Unspecified urinary incontinence: Secondary | ICD-10-CM | POA: Diagnosis not present

## 2020-01-02 DIAGNOSIS — L89153 Pressure ulcer of sacral region, stage 3: Secondary | ICD-10-CM | POA: Diagnosis not present

## 2020-01-02 DIAGNOSIS — I69391 Dysphagia following cerebral infarction: Secondary | ICD-10-CM | POA: Diagnosis not present

## 2020-01-02 DIAGNOSIS — I69319 Unspecified symptoms and signs involving cognitive functions following cerebral infarction: Secondary | ICD-10-CM | POA: Diagnosis not present

## 2020-01-02 DIAGNOSIS — Z8616 Personal history of COVID-19: Secondary | ICD-10-CM | POA: Diagnosis not present

## 2020-01-02 DIAGNOSIS — K219 Gastro-esophageal reflux disease without esophagitis: Secondary | ICD-10-CM | POA: Diagnosis not present

## 2020-01-02 DIAGNOSIS — I69354 Hemiplegia and hemiparesis following cerebral infarction affecting left non-dominant side: Secondary | ICD-10-CM | POA: Insufficient documentation

## 2020-01-02 DIAGNOSIS — D649 Anemia, unspecified: Secondary | ICD-10-CM | POA: Diagnosis not present

## 2020-01-02 DIAGNOSIS — R1312 Dysphagia, oropharyngeal phase: Secondary | ICD-10-CM | POA: Diagnosis not present

## 2020-01-02 DIAGNOSIS — K5900 Constipation, unspecified: Secondary | ICD-10-CM | POA: Diagnosis not present

## 2020-01-02 DIAGNOSIS — M6281 Muscle weakness (generalized): Secondary | ICD-10-CM | POA: Diagnosis not present

## 2020-01-02 DIAGNOSIS — L89154 Pressure ulcer of sacral region, stage 4: Secondary | ICD-10-CM | POA: Diagnosis not present

## 2020-01-02 DIAGNOSIS — U071 COVID-19: Secondary | ICD-10-CM | POA: Diagnosis not present

## 2020-01-02 NOTE — Patient Instructions (Signed)
No Driving °

## 2020-01-02 NOTE — Progress Notes (Signed)
Subjective:    Patient ID: [name], male    DOB: 10/02/1951, 69 y.o.   MRN: [id number] 70 year old right-handed male with history of GERD who presented to [facility] 07/26/2019 with shortness of breath and hypoxia. He was diagnosed with COVID-19 on September 20 of 2020. Oxygen saturations in the 40th percentile range. Placed on a nonrebreather mask. Patient was given IV steroids and remdesivir that he completed 07/30/2019. He did require intubation for airway protection. He had 1 unit of convalescent plasma and had received Decadron until 08/04/2019. He was treated with vancomycin and cefepime through 08/02/2019. Initial chest x-ray demonstrated diffuse multifocal infiltrates. CT of the chest negative for pulmonary emboli. He was admitted to [facility] pH 7.41, PCO2 44 PO2 of 87 on 100% and AA gradient calculates out to 571. Noted elevated creatinine of 1.51. Renal service is consulted for elevated creatinine/AKI necessitating the need for hemodialysis 08/07/2019 but has since been deemed chronic Tuesday Thursday Saturday schedule. Gastroenterology services consulted for ongoing melena drop in hemoglobin and patient was transfused 10 units of packed red blood cells total throughout hospital admission with latest hemoglobin 8.4. An endoscopy was completed showing no active bleeding or blood in the stomach. Very large gastric residual mostly tube feeding without anatomic gastric outlet obstruction suggestive gastric ileus. Patient's hemoglobin hematocrit have stabilized. Long-term intubation requiring tracheostomy tube 08/22/2019 patient is since been extubated. Diet has been advanced to regular consistency. Subcutaneous heparin for DVT prophylaxis. Hospital course complicated by bouts of atrial fibrillation with RVR 08/09/2019 patient was initially placed on amiodarone. Neurology service was consulted 08/27/2019 for altered mental status with MRI of the brain showing  small acute to early subacute infarcts in bilateral cerebral cortex and right cerebellum and maintained on low-dose aspirin for CVA prophylaxis.. Patient with development of sacral decubitus wound care dressing changes as per wound care nurse as well as wound care to partial-thickness areas of tissue loss at posterior aspect of penile shaft again with dressing changes as directed. Therapy evaluations completed patient was admitted for a comprehensive rehab program. Patient was then able to get out of car with minimal assist improved sequencing. Perform wheelchair mobility 100 feet supervision. Perform stand pivot to the bed PTA set up wheelchair to pivot to the right for optimal outcomes. Occupational Therapy continue to discuss home set up and along with patient decided that at home patient would likely sit edge of bed to wash upper body and return to supine for lower body bathing and dressing task as he was more independent and decreased fall risk. Patient is bathing with minimal assist upper body for hand over hand assistance to utilize left upper extremity to wash right upper extremity.  HPI Stitch from chest tube still in , Right chest  Wife assists pt with dressing , pt can do but takes too long Wife supervises wih shower chair transfers [place] once at home on 11/27/2019.  Went to the ED.  Return to ED the next day with headache.  Scanning did not show any new stroke or any evidence of subdural hematoma.  No subsequent falls. Currently receiving home health PT and OT 1 session each per week.  Patient is wondering whether he can get more session scheduled.  Wife states that according to therapy, this is an insurance issue. We discussed outpatient therapy referral to [facility], patient would like to hold off on this.  We discussed that that would allow him to go twice a week  Pain Inventory Average Pain 0 Pain Right Now 0 My pain is no pain  In the last 24 hours, has pain interfered  with the following? General activity 0 Relation with others 0 Enjoyment of life 0 What TIME of day is your pain at its worst? no pain Sleep (in general) Good  Pain is worse with: no pain Pain improves with: no pain Relief from Meds: no pain  Mobility walk with assistance use a walker use a wheelchair needs help with transfers  Function retired I need assistance with the following:  dressing, bathing, toileting, meal prep, household duties and shopping  Neuro/Psych trouble walking  Prior Studies Any changes since last visit?  no  Physicians involved in your care Any changes since last visit?  no   Family History  Problem Relation Age of Onset  . Colon cancer Neg Hx   . Colon polyps Neg Hx    Social History   Socioeconomic History  . Marital status: Married    Spouse name: Not on file  . Number of children: Not on file  . Years of education: bachelors  . Highest education level: Not on file  Occupational History  . Occupation: Scientist, clinical (histocompatibility and immunogenetics): [employer]  Tobacco Use  . Smoking status: Never Smoker  . Smokeless tobacco: Never Used  Substance and Sexual Activity  . Alcohol use: Yes    Alcohol/week: 2.0 standard drinks    Types: 2 Shots of liquor per week    Comment: moderate  . Drug use: No  . Sexual activity: Not on file  Other Topics Concern  . Not on file  Social History Narrative  . Not on file   Social Determinants of Health   Financial Resource Strain:   . Difficulty of Paying Living Expenses:   Food Insecurity:   . Worried About Charity fundraiser in the Last Year:   . Arboriculturist in the Last Year:   Transportation Needs:   . Film/video editor (Medical):   Marland Kitchen Lack of Transportation (Non-Medical):   Physical Activity:   . Days of Exercise per Week:   . Minutes of Exercise per Session:   Stress:   . Feeling of Stress :   Social Connections:   . Frequency of Communication with Friends and Family:   . Frequency of Social  Gatherings with Friends and Family:   . Attends Religious Services:   . Active Member of Clubs or Organizations:   . Attends Archivist Meetings:   Marland Kitchen Marital Status:    Past Surgical History:  Procedure Laterality Date  . ACHILLES TENDON SURGERY Right 2012  . [place] TRANSPOSITION  10/13/2019   Procedure: Basilic Vein Transposition Left Arm;  Surgeon: Angelia Mould, MD;  Location[facility] OR;  Service: Vascular;;  . COLONOSCOPY  2009   IH  . ESOPHAGOGASTRODUODENOSCOPY N/A 12/25/2014   Procedure: ESOPHAGOGASTRODUODENOSCOPY (EGD);  Surgeon: Danie Binder, MD;  Location: [facility]O SUITE;  Service: Endoscopy;  Laterality: N/A;  830am  . ESOPHAGOGASTRODUODENOSCOPY N/A 08/09/2019   Procedure: ESOPHAGOGASTRODUODENOSCOPY (EGD);  Surgeon: Ronald Lobo, MD;  Location: [facility];  Service: Endoscopy;  Laterality: N/A;  Patient is already sedated, so I do not anticipate need for further sedation.  Okay from my standpoint to do either at the bedside or in the operating room  . HEMOSTASIS CLIP PLACEMENT  08/09/2019   Procedure: HEMOSTASIS CLIP PLACEMENT;  Surgeon: Ronald Lobo, MD;  Location: [facility] ENDOSCOPY;  Service: Endoscopy;;  .  HEMOSTASIS CONTROL  08/09/2019   Procedure: HEMOSTASIS CONTROL;  Surgeon: Ronald Lobo, MD;  Location: [facility] ENDOSCOPY;  Service: Endoscopy;;  epi   . IR FLUORO GUIDE CV LINE LEFT  09/02/2019  . IR REMOVAL TUN CV CATH W/O FL  10/29/2019  . IR US GUIDE VASC ACCESS LEFT  09/02/2019  . KNEE SURGERY Left 1980's  . none    . WRIST SURGERY Left 1970's   Past Medical History:  Diagnosis Date  . CKD (chronic kidney disease), stage IV ([place]) 12/21/2019   Followed by [facility].  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 COVID-19   . Heartburn    BP 125/77   Pulse (!) 124   Temp 97.7 F (36.5 C)   Ht 5' 10.5" (1.791 m)   Wt 155 lb (70.3 kg)   SpO2 95%   BMI 21.93 kg/m   Opioid  Risk Score:   Fall Risk Score:  `1  Depression screen PHQ 2/9  No flowsheet data found.  Review of Systems  Constitutional: Negative.   HENT: Negative.   Eyes: Negative.   Respiratory: Negative.   Cardiovascular: Negative.   Gastrointestinal: Negative.   Endocrine: Negative.   Musculoskeletal: Positive for gait problem.  Skin: Negative.   Allergic/Immunologic: Negative.   Hematological: Negative.   Psychiatric/Behavioral: Negative.   All other systems reviewed and are negative.      Objective:   Physical Exam Vitals and nursing note reviewed.  Constitutional:      Appearance: Normal appearance.  Eyes:     Extraocular Movements: Extraocular movements intact.     Conjunctiva/sclera: Conjunctivae normal.     Pupils: Pupils are equal, round, and reactive to light.  Skin:    General: Skin is warm and dry.     Comments: Suture right anterior lateral chest wall.  No evidence of erythema or drainage.  Neurological:     General: No focal deficit present.     Mental Status: He is alert and oriented to person, place, and time.     Comments: 3+ in the left deltoid bicep tricep grip 4 - at the left knee extensor 3 - at the hip flexor 3 - at the ankle dorsiflexor. Gait is with a quad cane and left AFO no evidence of toe drag or knee stability with the brace on Sensation report is equal to light touch bilateral upper and lower limbs Right upper extremity and right lower extremity have normal  strength and sensation No evidence of excess tone in the left upper or left lower limb  Psychiatric:        Mood and Affect: Mood normal.        Behavior: Behavior normal.    Patient has some slowed processing but otherwise able to follow simple commands.       Assessment & Plan:  1.  Bilateral cerebral infarcts with left hemiparesis as well as cognitive deficits He continues to benefit from home health PT OT.  I do think he should be ready to transition to the outpatient therapy at  [facility] in the next several weeks. I do not think he should return to driving at this time but has the potential to do so in the future He should continue to wear his AFO.  #2.  History of chest tube with suture this was removed today

## 2020-01-03 ENCOUNTER — Ambulatory Visit: Payer: Medicare HMO | Attending: Internal Medicine

## 2020-01-03 DIAGNOSIS — Z23 Encounter for immunization: Secondary | ICD-10-CM

## 2020-01-03 NOTE — Progress Notes (Signed)
   Covid-19 Vaccination Clinic  Name:  [name]    MRN: [id number] DOB: Feb 10, 1951  01/03/2020  [name] was observed post Covid-19 immunization for 15 minutes without incident. He was provided with Vaccine Information Sheet and instruction to access the V-Safe system.   [name] was instructed to call 911 with any severe reactions post vaccine: Marland Kitchen Difficulty breathing  . Swelling of face and throat  . A fast heartbeat  . A bad rash all over body  . Dizziness and weakness   Immunizations Administered    Name Date Dose VIS Date Route   Moderna COVID-19 Vaccine 01/03/2020 10:49 AM 0.5 mL 09/23/2019 Intramuscular   Manufacturer: Moderna   Lot: [id number]   [place]: [id number]

## 2020-01-05 DIAGNOSIS — R1312 Dysphagia, oropharyngeal phase: Secondary | ICD-10-CM | POA: Diagnosis not present

## 2020-01-05 DIAGNOSIS — L89154 Pressure ulcer of sacral region, stage 4: Secondary | ICD-10-CM | POA: Diagnosis not present

## 2020-01-05 DIAGNOSIS — I69391 Dysphagia following cerebral infarction: Secondary | ICD-10-CM | POA: Diagnosis not present

## 2020-01-05 DIAGNOSIS — Z8616 Personal history of COVID-19: Secondary | ICD-10-CM | POA: Diagnosis not present

## 2020-01-05 DIAGNOSIS — D649 Anemia, unspecified: Secondary | ICD-10-CM | POA: Diagnosis not present

## 2020-01-05 DIAGNOSIS — K219 Gastro-esophageal reflux disease without esophagitis: Secondary | ICD-10-CM | POA: Diagnosis not present

## 2020-01-05 DIAGNOSIS — R32 Unspecified urinary incontinence: Secondary | ICD-10-CM | POA: Diagnosis not present

## 2020-01-05 DIAGNOSIS — K5900 Constipation, unspecified: Secondary | ICD-10-CM | POA: Diagnosis not present

## 2020-01-05 DIAGNOSIS — I69354 Hemiplegia and hemiparesis following cerebral infarction affecting left non-dominant side: Secondary | ICD-10-CM | POA: Diagnosis not present

## 2020-01-07 DIAGNOSIS — R1312 Dysphagia, oropharyngeal phase: Secondary | ICD-10-CM | POA: Diagnosis not present

## 2020-01-07 DIAGNOSIS — K219 Gastro-esophageal reflux disease without esophagitis: Secondary | ICD-10-CM | POA: Diagnosis not present

## 2020-01-07 DIAGNOSIS — I69391 Dysphagia following cerebral infarction: Secondary | ICD-10-CM | POA: Diagnosis not present

## 2020-01-07 DIAGNOSIS — L89154 Pressure ulcer of sacral region, stage 4: Secondary | ICD-10-CM | POA: Diagnosis not present

## 2020-01-07 DIAGNOSIS — K5900 Constipation, unspecified: Secondary | ICD-10-CM | POA: Diagnosis not present

## 2020-01-07 DIAGNOSIS — D649 Anemia, unspecified: Secondary | ICD-10-CM | POA: Diagnosis not present

## 2020-01-07 DIAGNOSIS — R32 Unspecified urinary incontinence: Secondary | ICD-10-CM | POA: Diagnosis not present

## 2020-01-07 DIAGNOSIS — Z8616 Personal history of COVID-19: Secondary | ICD-10-CM | POA: Diagnosis not present

## 2020-01-07 DIAGNOSIS — I69354 Hemiplegia and hemiparesis following cerebral infarction affecting left non-dominant side: Secondary | ICD-10-CM | POA: Diagnosis not present

## 2020-01-08 DIAGNOSIS — K5900 Constipation, unspecified: Secondary | ICD-10-CM | POA: Diagnosis not present

## 2020-01-08 DIAGNOSIS — Z8616 Personal history of COVID-19: Secondary | ICD-10-CM | POA: Diagnosis not present

## 2020-01-08 DIAGNOSIS — I69391 Dysphagia following cerebral infarction: Secondary | ICD-10-CM | POA: Diagnosis not present

## 2020-01-08 DIAGNOSIS — D649 Anemia, unspecified: Secondary | ICD-10-CM | POA: Diagnosis not present

## 2020-01-08 DIAGNOSIS — I69354 Hemiplegia and hemiparesis following cerebral infarction affecting left non-dominant side: Secondary | ICD-10-CM | POA: Diagnosis not present

## 2020-01-08 DIAGNOSIS — L89154 Pressure ulcer of sacral region, stage 4: Secondary | ICD-10-CM | POA: Diagnosis not present

## 2020-01-08 DIAGNOSIS — R1312 Dysphagia, oropharyngeal phase: Secondary | ICD-10-CM | POA: Diagnosis not present

## 2020-01-08 DIAGNOSIS — K219 Gastro-esophageal reflux disease without esophagitis: Secondary | ICD-10-CM | POA: Diagnosis not present

## 2020-01-08 DIAGNOSIS — R32 Unspecified urinary incontinence: Secondary | ICD-10-CM | POA: Diagnosis not present

## 2020-01-09 DIAGNOSIS — N184 Chronic kidney disease, stage 4 (severe): Secondary | ICD-10-CM | POA: Diagnosis not present

## 2020-01-12 DIAGNOSIS — L89154 Pressure ulcer of sacral region, stage 4: Secondary | ICD-10-CM | POA: Diagnosis not present

## 2020-01-12 DIAGNOSIS — I69354 Hemiplegia and hemiparesis following cerebral infarction affecting left non-dominant side: Secondary | ICD-10-CM | POA: Diagnosis not present

## 2020-01-12 DIAGNOSIS — Z8701 Personal history of pneumonia (recurrent): Secondary | ICD-10-CM | POA: Diagnosis not present

## 2020-01-12 DIAGNOSIS — D649 Anemia, unspecified: Secondary | ICD-10-CM | POA: Diagnosis not present

## 2020-01-12 DIAGNOSIS — K219 Gastro-esophageal reflux disease without esophagitis: Secondary | ICD-10-CM | POA: Diagnosis not present

## 2020-01-12 DIAGNOSIS — Z9181 History of falling: Secondary | ICD-10-CM | POA: Diagnosis not present

## 2020-01-12 DIAGNOSIS — I69391 Dysphagia following cerebral infarction: Secondary | ICD-10-CM | POA: Diagnosis not present

## 2020-01-12 DIAGNOSIS — R1312 Dysphagia, oropharyngeal phase: Secondary | ICD-10-CM | POA: Diagnosis not present

## 2020-01-12 DIAGNOSIS — K5900 Constipation, unspecified: Secondary | ICD-10-CM | POA: Diagnosis not present

## 2020-01-13 DIAGNOSIS — Z8701 Personal history of pneumonia (recurrent): Secondary | ICD-10-CM | POA: Diagnosis not present

## 2020-01-13 DIAGNOSIS — K219 Gastro-esophageal reflux disease without esophagitis: Secondary | ICD-10-CM | POA: Diagnosis not present

## 2020-01-13 DIAGNOSIS — R1312 Dysphagia, oropharyngeal phase: Secondary | ICD-10-CM | POA: Diagnosis not present

## 2020-01-13 DIAGNOSIS — D649 Anemia, unspecified: Secondary | ICD-10-CM | POA: Diagnosis not present

## 2020-01-13 DIAGNOSIS — L89154 Pressure ulcer of sacral region, stage 4: Secondary | ICD-10-CM | POA: Diagnosis not present

## 2020-01-13 DIAGNOSIS — I69354 Hemiplegia and hemiparesis following cerebral infarction affecting left non-dominant side: Secondary | ICD-10-CM | POA: Diagnosis not present

## 2020-01-13 DIAGNOSIS — Z9181 History of falling: Secondary | ICD-10-CM | POA: Diagnosis not present

## 2020-01-13 DIAGNOSIS — I69391 Dysphagia following cerebral infarction: Secondary | ICD-10-CM | POA: Diagnosis not present

## 2020-01-13 DIAGNOSIS — K5900 Constipation, unspecified: Secondary | ICD-10-CM | POA: Diagnosis not present

## 2020-01-14 DIAGNOSIS — K219 Gastro-esophageal reflux disease without esophagitis: Secondary | ICD-10-CM | POA: Diagnosis not present

## 2020-01-14 DIAGNOSIS — L89154 Pressure ulcer of sacral region, stage 4: Secondary | ICD-10-CM | POA: Diagnosis not present

## 2020-01-14 DIAGNOSIS — I69354 Hemiplegia and hemiparesis following cerebral infarction affecting left non-dominant side: Secondary | ICD-10-CM | POA: Diagnosis not present

## 2020-01-14 DIAGNOSIS — Z9181 History of falling: Secondary | ICD-10-CM | POA: Diagnosis not present

## 2020-01-14 DIAGNOSIS — D649 Anemia, unspecified: Secondary | ICD-10-CM | POA: Diagnosis not present

## 2020-01-14 DIAGNOSIS — K5900 Constipation, unspecified: Secondary | ICD-10-CM | POA: Diagnosis not present

## 2020-01-14 DIAGNOSIS — R1312 Dysphagia, oropharyngeal phase: Secondary | ICD-10-CM | POA: Diagnosis not present

## 2020-01-14 DIAGNOSIS — I69391 Dysphagia following cerebral infarction: Secondary | ICD-10-CM | POA: Diagnosis not present

## 2020-01-14 DIAGNOSIS — Z8701 Personal history of pneumonia (recurrent): Secondary | ICD-10-CM | POA: Diagnosis not present

## 2020-01-16 ENCOUNTER — Ambulatory Visit
Admission: RE | Admit: 2020-01-16 | Discharge: 2020-01-16 | Disposition: A | Discharge status: Home / Self Care | Payer: Medicare HMO | Source: Ambulatory Visit | Attending: Adult Health | Admitting: Adult Health

## 2020-01-16 ENCOUNTER — Other Ambulatory Visit: Payer: Self-pay

## 2020-01-16 DIAGNOSIS — I609 Nontraumatic subarachnoid hemorrhage, unspecified: Secondary | ICD-10-CM

## 2020-01-19 ENCOUNTER — Telehealth: Payer: Self-pay | Admitting: *Deleted

## 2020-01-19 DIAGNOSIS — I69354 Hemiplegia and hemiparesis following cerebral infarction affecting left non-dominant side: Secondary | ICD-10-CM | POA: Diagnosis not present

## 2020-01-19 DIAGNOSIS — Z8701 Personal history of pneumonia (recurrent): Secondary | ICD-10-CM | POA: Diagnosis not present

## 2020-01-19 DIAGNOSIS — K219 Gastro-esophageal reflux disease without esophagitis: Secondary | ICD-10-CM | POA: Diagnosis not present

## 2020-01-19 DIAGNOSIS — L89154 Pressure ulcer of sacral region, stage 4: Secondary | ICD-10-CM | POA: Diagnosis not present

## 2020-01-19 DIAGNOSIS — R1312 Dysphagia, oropharyngeal phase: Secondary | ICD-10-CM | POA: Diagnosis not present

## 2020-01-19 DIAGNOSIS — K5900 Constipation, unspecified: Secondary | ICD-10-CM | POA: Diagnosis not present

## 2020-01-19 DIAGNOSIS — I69391 Dysphagia following cerebral infarction: Secondary | ICD-10-CM | POA: Diagnosis not present

## 2020-01-19 DIAGNOSIS — D649 Anemia, unspecified: Secondary | ICD-10-CM | POA: Diagnosis not present

## 2020-01-19 DIAGNOSIS — Z9181 History of falling: Secondary | ICD-10-CM | POA: Diagnosis not present

## 2020-01-19 NOTE — Telephone Encounter (Signed)
I called and was not able to LM, to give CT results.

## 2020-01-19 NOTE — Telephone Encounter (Signed)
-----   Message from Frann Rider, NP sent at 01/19/2020 12:13 PM EDT ----- Please advise patient that his recent CT head did show resolution of prior bleed.  Would recommend restarting aspirin 81 mg daily for secondary stroke prevention as recent SDH traumatic cause.

## 2020-01-20 ENCOUNTER — Encounter: Payer: Self-pay | Admitting: *Deleted

## 2020-01-20 NOTE — Telephone Encounter (Signed)
Mailed result letter

## 2020-01-21 DIAGNOSIS — K219 Gastro-esophageal reflux disease without esophagitis: Secondary | ICD-10-CM | POA: Diagnosis not present

## 2020-01-21 DIAGNOSIS — R1312 Dysphagia, oropharyngeal phase: Secondary | ICD-10-CM | POA: Diagnosis not present

## 2020-01-21 DIAGNOSIS — D649 Anemia, unspecified: Secondary | ICD-10-CM | POA: Diagnosis not present

## 2020-01-21 DIAGNOSIS — K5900 Constipation, unspecified: Secondary | ICD-10-CM | POA: Diagnosis not present

## 2020-01-21 DIAGNOSIS — I69391 Dysphagia following cerebral infarction: Secondary | ICD-10-CM | POA: Diagnosis not present

## 2020-01-21 DIAGNOSIS — Z8701 Personal history of pneumonia (recurrent): Secondary | ICD-10-CM | POA: Diagnosis not present

## 2020-01-21 DIAGNOSIS — I69354 Hemiplegia and hemiparesis following cerebral infarction affecting left non-dominant side: Secondary | ICD-10-CM | POA: Diagnosis not present

## 2020-01-21 DIAGNOSIS — Z9181 History of falling: Secondary | ICD-10-CM | POA: Diagnosis not present

## 2020-01-21 DIAGNOSIS — L89154 Pressure ulcer of sacral region, stage 4: Secondary | ICD-10-CM | POA: Diagnosis not present

## 2020-01-26 DIAGNOSIS — I69391 Dysphagia following cerebral infarction: Secondary | ICD-10-CM | POA: Diagnosis not present

## 2020-01-26 DIAGNOSIS — D649 Anemia, unspecified: Secondary | ICD-10-CM | POA: Diagnosis not present

## 2020-01-26 DIAGNOSIS — L89154 Pressure ulcer of sacral region, stage 4: Secondary | ICD-10-CM | POA: Diagnosis not present

## 2020-01-26 DIAGNOSIS — K219 Gastro-esophageal reflux disease without esophagitis: Secondary | ICD-10-CM | POA: Diagnosis not present

## 2020-01-26 DIAGNOSIS — R1312 Dysphagia, oropharyngeal phase: Secondary | ICD-10-CM | POA: Diagnosis not present

## 2020-01-26 DIAGNOSIS — Z9181 History of falling: Secondary | ICD-10-CM | POA: Diagnosis not present

## 2020-01-26 DIAGNOSIS — K5900 Constipation, unspecified: Secondary | ICD-10-CM | POA: Diagnosis not present

## 2020-01-26 DIAGNOSIS — Z8701 Personal history of pneumonia (recurrent): Secondary | ICD-10-CM | POA: Diagnosis not present

## 2020-01-26 DIAGNOSIS — I69354 Hemiplegia and hemiparesis following cerebral infarction affecting left non-dominant side: Secondary | ICD-10-CM | POA: Diagnosis not present

## 2020-01-28 DIAGNOSIS — Z8701 Personal history of pneumonia (recurrent): Secondary | ICD-10-CM | POA: Diagnosis not present

## 2020-01-28 DIAGNOSIS — I69354 Hemiplegia and hemiparesis following cerebral infarction affecting left non-dominant side: Secondary | ICD-10-CM | POA: Diagnosis not present

## 2020-01-28 DIAGNOSIS — R1312 Dysphagia, oropharyngeal phase: Secondary | ICD-10-CM | POA: Diagnosis not present

## 2020-01-28 DIAGNOSIS — K219 Gastro-esophageal reflux disease without esophagitis: Secondary | ICD-10-CM | POA: Diagnosis not present

## 2020-01-28 DIAGNOSIS — K5900 Constipation, unspecified: Secondary | ICD-10-CM | POA: Diagnosis not present

## 2020-01-28 DIAGNOSIS — Z9181 History of falling: Secondary | ICD-10-CM | POA: Diagnosis not present

## 2020-01-28 DIAGNOSIS — I69391 Dysphagia following cerebral infarction: Secondary | ICD-10-CM | POA: Diagnosis not present

## 2020-01-28 DIAGNOSIS — L89154 Pressure ulcer of sacral region, stage 4: Secondary | ICD-10-CM | POA: Diagnosis not present

## 2020-01-28 DIAGNOSIS — D649 Anemia, unspecified: Secondary | ICD-10-CM | POA: Diagnosis not present

## 2020-01-29 DIAGNOSIS — K219 Gastro-esophageal reflux disease without esophagitis: Secondary | ICD-10-CM | POA: Diagnosis not present

## 2020-01-29 DIAGNOSIS — I69391 Dysphagia following cerebral infarction: Secondary | ICD-10-CM | POA: Diagnosis not present

## 2020-01-29 DIAGNOSIS — R1312 Dysphagia, oropharyngeal phase: Secondary | ICD-10-CM | POA: Diagnosis not present

## 2020-01-29 DIAGNOSIS — Z9181 History of falling: Secondary | ICD-10-CM | POA: Diagnosis not present

## 2020-01-29 DIAGNOSIS — D649 Anemia, unspecified: Secondary | ICD-10-CM | POA: Diagnosis not present

## 2020-01-29 DIAGNOSIS — L89154 Pressure ulcer of sacral region, stage 4: Secondary | ICD-10-CM | POA: Diagnosis not present

## 2020-01-29 DIAGNOSIS — K5900 Constipation, unspecified: Secondary | ICD-10-CM | POA: Diagnosis not present

## 2020-01-29 DIAGNOSIS — I69354 Hemiplegia and hemiparesis following cerebral infarction affecting left non-dominant side: Secondary | ICD-10-CM | POA: Diagnosis not present

## 2020-01-29 DIAGNOSIS — Z8701 Personal history of pneumonia (recurrent): Secondary | ICD-10-CM | POA: Diagnosis not present

## 2020-02-02 DIAGNOSIS — Z9889 Other specified postprocedural states: Secondary | ICD-10-CM | POA: Diagnosis not present

## 2020-02-02 DIAGNOSIS — U071 COVID-19: Secondary | ICD-10-CM | POA: Diagnosis not present

## 2020-02-02 DIAGNOSIS — R0989 Other specified symptoms and signs involving the circulatory and respiratory systems: Secondary | ICD-10-CM | POA: Insufficient documentation

## 2020-02-02 DIAGNOSIS — J383 Other diseases of vocal cords: Secondary | ICD-10-CM | POA: Diagnosis not present

## 2020-02-02 DIAGNOSIS — M6281 Muscle weakness (generalized): Secondary | ICD-10-CM | POA: Diagnosis not present

## 2020-02-02 DIAGNOSIS — L89153 Pressure ulcer of sacral region, stage 3: Secondary | ICD-10-CM | POA: Diagnosis not present

## 2020-02-02 DIAGNOSIS — R490 Dysphonia: Secondary | ICD-10-CM | POA: Insufficient documentation

## 2020-02-02 DIAGNOSIS — Z8616 Personal history of COVID-19: Secondary | ICD-10-CM | POA: Diagnosis not present

## 2020-02-02 DIAGNOSIS — R09A2 Foreign body sensation, throat: Secondary | ICD-10-CM | POA: Insufficient documentation

## 2020-02-03 DIAGNOSIS — Z9181 History of falling: Secondary | ICD-10-CM | POA: Diagnosis not present

## 2020-02-03 DIAGNOSIS — I69391 Dysphagia following cerebral infarction: Secondary | ICD-10-CM | POA: Diagnosis not present

## 2020-02-03 DIAGNOSIS — K219 Gastro-esophageal reflux disease without esophagitis: Secondary | ICD-10-CM | POA: Diagnosis not present

## 2020-02-03 DIAGNOSIS — Z8701 Personal history of pneumonia (recurrent): Secondary | ICD-10-CM | POA: Diagnosis not present

## 2020-02-03 DIAGNOSIS — K5900 Constipation, unspecified: Secondary | ICD-10-CM | POA: Diagnosis not present

## 2020-02-03 DIAGNOSIS — R1312 Dysphagia, oropharyngeal phase: Secondary | ICD-10-CM | POA: Diagnosis not present

## 2020-02-03 DIAGNOSIS — D649 Anemia, unspecified: Secondary | ICD-10-CM | POA: Diagnosis not present

## 2020-02-03 DIAGNOSIS — I69354 Hemiplegia and hemiparesis following cerebral infarction affecting left non-dominant side: Secondary | ICD-10-CM | POA: Diagnosis not present

## 2020-02-03 DIAGNOSIS — L89154 Pressure ulcer of sacral region, stage 4: Secondary | ICD-10-CM | POA: Diagnosis not present

## 2020-02-04 ENCOUNTER — Ambulatory Visit: Payer: Medicare HMO | Attending: Internal Medicine

## 2020-02-04 DIAGNOSIS — Z23 Encounter for immunization: Secondary | ICD-10-CM

## 2020-02-04 NOTE — Progress Notes (Signed)
.  covidobs

## 2020-02-04 NOTE — Progress Notes (Signed)
   Covid-19 Vaccination Clinic  Name:  [name]    MRN: [id number] DOB: May 23, 1951  02/04/2020  [name] was observed post Covid-19 immunization for 15 minutes without incident. He was provided with Vaccine Information Sheet and instruction to access the V-Safe system.   [name] was instructed to call 911 with any severe reactions post vaccine: Marland Kitchen Difficulty breathing  . Swelling of face and throat  . A fast heartbeat  . A bad rash all over body  . Dizziness and weakness   Immunizations Administered    Name Date Dose VIS Date Route   Moderna COVID-19 Vaccine 02/04/2020 10:16 AM 0.5 mL 09/23/2019 Intramuscular   Manufacturer: Moderna   Lot: [id number]   [place]: [id number]

## 2020-02-05 DIAGNOSIS — L89154 Pressure ulcer of sacral region, stage 4: Secondary | ICD-10-CM | POA: Diagnosis not present

## 2020-02-05 DIAGNOSIS — I69354 Hemiplegia and hemiparesis following cerebral infarction affecting left non-dominant side: Secondary | ICD-10-CM | POA: Diagnosis not present

## 2020-02-05 DIAGNOSIS — Z9181 History of falling: Secondary | ICD-10-CM | POA: Diagnosis not present

## 2020-02-05 DIAGNOSIS — D649 Anemia, unspecified: Secondary | ICD-10-CM | POA: Diagnosis not present

## 2020-02-05 DIAGNOSIS — K5900 Constipation, unspecified: Secondary | ICD-10-CM | POA: Diagnosis not present

## 2020-02-05 DIAGNOSIS — Z8701 Personal history of pneumonia (recurrent): Secondary | ICD-10-CM | POA: Diagnosis not present

## 2020-02-05 DIAGNOSIS — K219 Gastro-esophageal reflux disease without esophagitis: Secondary | ICD-10-CM | POA: Diagnosis not present

## 2020-02-05 DIAGNOSIS — R1312 Dysphagia, oropharyngeal phase: Secondary | ICD-10-CM | POA: Diagnosis not present

## 2020-02-05 DIAGNOSIS — I69391 Dysphagia following cerebral infarction: Secondary | ICD-10-CM | POA: Diagnosis not present

## 2020-02-09 ENCOUNTER — Telehealth: Payer: Self-pay | Admitting: Family Medicine

## 2020-02-09 DIAGNOSIS — K219 Gastro-esophageal reflux disease without esophagitis: Secondary | ICD-10-CM | POA: Diagnosis not present

## 2020-02-09 DIAGNOSIS — Z8701 Personal history of pneumonia (recurrent): Secondary | ICD-10-CM | POA: Diagnosis not present

## 2020-02-09 DIAGNOSIS — Z9181 History of falling: Secondary | ICD-10-CM | POA: Diagnosis not present

## 2020-02-09 DIAGNOSIS — I69391 Dysphagia following cerebral infarction: Secondary | ICD-10-CM | POA: Diagnosis not present

## 2020-02-09 DIAGNOSIS — R1312 Dysphagia, oropharyngeal phase: Secondary | ICD-10-CM | POA: Diagnosis not present

## 2020-02-09 DIAGNOSIS — D649 Anemia, unspecified: Secondary | ICD-10-CM | POA: Diagnosis not present

## 2020-02-09 DIAGNOSIS — L89154 Pressure ulcer of sacral region, stage 4: Secondary | ICD-10-CM | POA: Diagnosis not present

## 2020-02-09 DIAGNOSIS — I69354 Hemiplegia and hemiparesis following cerebral infarction affecting left non-dominant side: Secondary | ICD-10-CM | POA: Diagnosis not present

## 2020-02-09 DIAGNOSIS — K5900 Constipation, unspecified: Secondary | ICD-10-CM | POA: Diagnosis not present

## 2020-02-09 NOTE — Telephone Encounter (Signed)
Pt and wife state tomorrow afternoon would be best and they appreciate it!

## 2020-02-09 NOTE — Telephone Encounter (Signed)
To useI can do a home visit for this patient either Tuesday or Thursday (The patient would be put on the schedule as a 4:10 PM home visit, let family know that I would be coming by in the vicinity of 445 or [phone] at the latest

## 2020-02-09 NOTE — Telephone Encounter (Signed)
Leda Gauze from [facility] calling in regards to patient wound. Pt has sacral wound. About 2 months ago pt began to have tan colored drainage; provider did home visit and prescribed antibiotics. Wound nurse states that wife reports blood tinged drainage on Saturday. Wound nurse feels healing is at a stand still. Wound nurse wondering if provider would like to do a home visit? Please advise. Thank you.

## 2020-02-10 ENCOUNTER — Ambulatory Visit: Payer: Medicare HMO | Admitting: Family Medicine

## 2020-02-10 DIAGNOSIS — K5900 Constipation, unspecified: Secondary | ICD-10-CM | POA: Diagnosis not present

## 2020-02-10 DIAGNOSIS — Z87898 Personal history of other specified conditions: Secondary | ICD-10-CM

## 2020-02-10 DIAGNOSIS — Z8701 Personal history of pneumonia (recurrent): Secondary | ICD-10-CM | POA: Diagnosis not present

## 2020-02-10 DIAGNOSIS — K219 Gastro-esophageal reflux disease without esophagitis: Secondary | ICD-10-CM | POA: Diagnosis not present

## 2020-02-10 DIAGNOSIS — Z79899 Other long term (current) drug therapy: Secondary | ICD-10-CM

## 2020-02-10 DIAGNOSIS — I69391 Dysphagia following cerebral infarction: Secondary | ICD-10-CM | POA: Diagnosis not present

## 2020-02-10 DIAGNOSIS — Z8616 Personal history of COVID-19: Secondary | ICD-10-CM | POA: Diagnosis not present

## 2020-02-10 DIAGNOSIS — N184 Chronic kidney disease, stage 4 (severe): Secondary | ICD-10-CM

## 2020-02-10 DIAGNOSIS — E7849 Other hyperlipidemia: Secondary | ICD-10-CM | POA: Diagnosis not present

## 2020-02-10 DIAGNOSIS — S31000D Unspecified open wound of lower back and pelvis without penetration into retroperitoneum, subsequent encounter: Secondary | ICD-10-CM

## 2020-02-10 DIAGNOSIS — I69354 Hemiplegia and hemiparesis following cerebral infarction affecting left non-dominant side: Secondary | ICD-10-CM | POA: Diagnosis not present

## 2020-02-10 DIAGNOSIS — R1312 Dysphagia, oropharyngeal phase: Secondary | ICD-10-CM | POA: Diagnosis not present

## 2020-02-10 DIAGNOSIS — L89154 Pressure ulcer of sacral region, stage 4: Secondary | ICD-10-CM | POA: Diagnosis not present

## 2020-02-10 DIAGNOSIS — Z9181 History of falling: Secondary | ICD-10-CM | POA: Diagnosis not present

## 2020-02-10 DIAGNOSIS — D649 Anemia, unspecified: Secondary | ICD-10-CM | POA: Diagnosis not present

## 2020-02-10 DIAGNOSIS — L89152 Pressure ulcer of sacral region, stage 2: Secondary | ICD-10-CM

## 2020-02-11 DIAGNOSIS — D649 Anemia, unspecified: Secondary | ICD-10-CM | POA: Diagnosis not present

## 2020-02-11 DIAGNOSIS — K5900 Constipation, unspecified: Secondary | ICD-10-CM | POA: Diagnosis not present

## 2020-02-11 DIAGNOSIS — I69391 Dysphagia following cerebral infarction: Secondary | ICD-10-CM | POA: Diagnosis not present

## 2020-02-11 DIAGNOSIS — Z8701 Personal history of pneumonia (recurrent): Secondary | ICD-10-CM | POA: Diagnosis not present

## 2020-02-11 DIAGNOSIS — R1312 Dysphagia, oropharyngeal phase: Secondary | ICD-10-CM | POA: Diagnosis not present

## 2020-02-11 DIAGNOSIS — L89154 Pressure ulcer of sacral region, stage 4: Secondary | ICD-10-CM | POA: Diagnosis not present

## 2020-02-11 DIAGNOSIS — I69354 Hemiplegia and hemiparesis following cerebral infarction affecting left non-dominant side: Secondary | ICD-10-CM | POA: Diagnosis not present

## 2020-02-11 DIAGNOSIS — K219 Gastro-esophageal reflux disease without esophagitis: Secondary | ICD-10-CM | POA: Diagnosis not present

## 2020-02-11 DIAGNOSIS — Z9181 History of falling: Secondary | ICD-10-CM | POA: Diagnosis not present

## 2020-02-15 NOTE — Progress Notes (Signed)
Home visit was completed on this day Patient was having drainage from sacral ulcer There was concerned that it could be a sign of an abscess Not having any pain Wound care through home health is coming out Wife is doing a good job of packing this on a daily basis Patient overall is doing improved having better strength on the left side able to walk with a walker Sometimes able to walk with a cane Breathing overall good Lungs clear respiratory rate normal heart regular Extremities no edema Blood pressure good Sacral wound is being packed no sign of infection Gradually healing in by secondary intent To do regular follow-ups. Patient is due for follow-up on his lab work.  [facility] He is also followed by [facility] He also sees neurology on an infrequent basis

## 2020-02-16 DIAGNOSIS — L89154 Pressure ulcer of sacral region, stage 4: Secondary | ICD-10-CM | POA: Diagnosis not present

## 2020-02-16 DIAGNOSIS — I69391 Dysphagia following cerebral infarction: Secondary | ICD-10-CM | POA: Diagnosis not present

## 2020-02-16 DIAGNOSIS — R1312 Dysphagia, oropharyngeal phase: Secondary | ICD-10-CM | POA: Diagnosis not present

## 2020-02-16 DIAGNOSIS — Z8701 Personal history of pneumonia (recurrent): Secondary | ICD-10-CM | POA: Diagnosis not present

## 2020-02-16 DIAGNOSIS — I69354 Hemiplegia and hemiparesis following cerebral infarction affecting left non-dominant side: Secondary | ICD-10-CM | POA: Diagnosis not present

## 2020-02-16 DIAGNOSIS — K5900 Constipation, unspecified: Secondary | ICD-10-CM | POA: Diagnosis not present

## 2020-02-16 DIAGNOSIS — D649 Anemia, unspecified: Secondary | ICD-10-CM | POA: Diagnosis not present

## 2020-02-16 DIAGNOSIS — Z9181 History of falling: Secondary | ICD-10-CM | POA: Diagnosis not present

## 2020-02-16 DIAGNOSIS — K219 Gastro-esophageal reflux disease without esophagitis: Secondary | ICD-10-CM | POA: Diagnosis not present

## 2020-02-16 NOTE — Progress Notes (Signed)
Bw orders put in and added psa because he has not seen urology. Pt was notified.

## 2020-02-16 NOTE — Addendum Note (Signed)
Addended by: Carmelina Noun on: 02/16/2020 10:18 AM   Modules accepted: Orders

## 2020-02-18 DIAGNOSIS — K219 Gastro-esophageal reflux disease without esophagitis: Secondary | ICD-10-CM | POA: Diagnosis not present

## 2020-02-18 DIAGNOSIS — Z8701 Personal history of pneumonia (recurrent): Secondary | ICD-10-CM | POA: Diagnosis not present

## 2020-02-18 DIAGNOSIS — D649 Anemia, unspecified: Secondary | ICD-10-CM | POA: Diagnosis not present

## 2020-02-18 DIAGNOSIS — L89154 Pressure ulcer of sacral region, stage 4: Secondary | ICD-10-CM | POA: Diagnosis not present

## 2020-02-18 DIAGNOSIS — I69391 Dysphagia following cerebral infarction: Secondary | ICD-10-CM | POA: Diagnosis not present

## 2020-02-18 DIAGNOSIS — R1312 Dysphagia, oropharyngeal phase: Secondary | ICD-10-CM | POA: Diagnosis not present

## 2020-02-18 DIAGNOSIS — K5900 Constipation, unspecified: Secondary | ICD-10-CM | POA: Diagnosis not present

## 2020-02-18 DIAGNOSIS — Z9181 History of falling: Secondary | ICD-10-CM | POA: Diagnosis not present

## 2020-02-18 DIAGNOSIS — I69354 Hemiplegia and hemiparesis following cerebral infarction affecting left non-dominant side: Secondary | ICD-10-CM | POA: Diagnosis not present

## 2020-02-20 ENCOUNTER — Telehealth: Payer: Self-pay | Admitting: Family Medicine

## 2020-02-20 NOTE — Telephone Encounter (Signed)
Please give him verbal order to be able to move forward with his request

## 2020-02-20 NOTE — Telephone Encounter (Signed)
Ryan w/[facility] called, needs verbal OK to change orders  Needs orders to change pt's foam dressing 3 times a week (insurance will not cover daily change of foam dressing)  Please advise & call Thurmond Butts [phone]

## 2020-02-20 NOTE — Telephone Encounter (Signed)
Left message to return call 

## 2020-02-23 NOTE — Telephone Encounter (Signed)
Spoke to [employer] [facility] [employer] -- he will be doing dressing changes 3 x per week.

## 2020-02-25 ENCOUNTER — Telehealth: Payer: Self-pay | Admitting: Family Medicine

## 2020-02-25 DIAGNOSIS — L89154 Pressure ulcer of sacral region, stage 4: Secondary | ICD-10-CM | POA: Diagnosis not present

## 2020-02-25 DIAGNOSIS — R1312 Dysphagia, oropharyngeal phase: Secondary | ICD-10-CM | POA: Diagnosis not present

## 2020-02-25 DIAGNOSIS — D649 Anemia, unspecified: Secondary | ICD-10-CM | POA: Diagnosis not present

## 2020-02-25 DIAGNOSIS — Z9181 History of falling: Secondary | ICD-10-CM | POA: Diagnosis not present

## 2020-02-25 DIAGNOSIS — K5900 Constipation, unspecified: Secondary | ICD-10-CM | POA: Diagnosis not present

## 2020-02-25 DIAGNOSIS — I69354 Hemiplegia and hemiparesis following cerebral infarction affecting left non-dominant side: Secondary | ICD-10-CM | POA: Diagnosis not present

## 2020-02-25 DIAGNOSIS — Z8701 Personal history of pneumonia (recurrent): Secondary | ICD-10-CM | POA: Diagnosis not present

## 2020-02-25 DIAGNOSIS — K219 Gastro-esophageal reflux disease without esophagitis: Secondary | ICD-10-CM | POA: Diagnosis not present

## 2020-02-25 DIAGNOSIS — I69391 Dysphagia following cerebral infarction: Secondary | ICD-10-CM | POA: Diagnosis not present

## 2020-02-25 NOTE — Telephone Encounter (Signed)
Ryan the [employer] copy with [facility] calling to make Dr. Nicki Reaper aware they have discharged [name] effective today due to an inappropriate comment made to a male worker.   Thurmond Butts states they had just about finished services with him and would like to know if Dr. Nicki Reaper would put in a referral for out patient rehab.   Ryan's CB# [phone]

## 2020-02-26 NOTE — Telephone Encounter (Signed)
Left message to return call 

## 2020-02-26 NOTE — Telephone Encounter (Signed)
Please connect with clinical manager we would recommend outpatient physical therapy More than likely they would prefer in [place]. Is this something that we would be the ones to go ahead and do that referral?  Thank you

## 2020-02-27 ENCOUNTER — Telehealth: Payer: Self-pay | Admitting: Family Medicine

## 2020-02-27 ENCOUNTER — Other Ambulatory Visit: Payer: Self-pay

## 2020-02-27 ENCOUNTER — Ambulatory Visit (INDEPENDENT_AMBULATORY_CARE_PROVIDER_SITE_OTHER): Payer: Medicare HMO | Admitting: Family Medicine

## 2020-02-27 VITALS — BP 120/84 | Temp 97.8°F | Wt 152.8 lb

## 2020-02-27 DIAGNOSIS — Z8673 Personal history of transient ischemic attack (TIA), and cerebral infarction without residual deficits: Secondary | ICD-10-CM

## 2020-02-27 DIAGNOSIS — R531 Weakness: Secondary | ICD-10-CM

## 2020-02-27 DIAGNOSIS — R270 Ataxia, unspecified: Secondary | ICD-10-CM | POA: Diagnosis not present

## 2020-02-27 DIAGNOSIS — R972 Elevated prostate specific antigen [PSA]: Secondary | ICD-10-CM | POA: Diagnosis not present

## 2020-02-27 DIAGNOSIS — R253 Fasciculation: Secondary | ICD-10-CM

## 2020-02-27 DIAGNOSIS — R29898 Other symptoms and signs involving the musculoskeletal system: Secondary | ICD-10-CM | POA: Diagnosis not present

## 2020-02-27 DIAGNOSIS — N289 Disorder of kidney and ureter, unspecified: Secondary | ICD-10-CM | POA: Diagnosis not present

## 2020-02-27 DIAGNOSIS — E7849 Other hyperlipidemia: Secondary | ICD-10-CM | POA: Diagnosis not present

## 2020-02-27 DIAGNOSIS — Z79899 Other long term (current) drug therapy: Secondary | ICD-10-CM | POA: Diagnosis not present

## 2020-02-27 DIAGNOSIS — Z87898 Personal history of other specified conditions: Secondary | ICD-10-CM | POA: Diagnosis not present

## 2020-02-27 DIAGNOSIS — N184 Chronic kidney disease, stage 4 (severe): Secondary | ICD-10-CM | POA: Diagnosis not present

## 2020-02-27 MED ORDER — DIAZEPAM 5 MG PO TABS: ORAL_TABLET | ORAL | 1 refills | Status: DC

## 2020-02-27 NOTE — Telephone Encounter (Signed)
Nurses Patient with a history of a stroke Needs physical therapy Referral to physical therapy [place] [address] Evaluate and treat History of stroke, left-sided weakness  Please initiate referral Please notify patient it has been initiated give him the phone number for physical therapy please if he has not heard from them within 1 week he should call them to help get it scheduled  Also patient inquired if I thought dexamethasone would help him I would not recommend dexamethasone  Follow-up with [place] within 3 months

## 2020-02-27 NOTE — Telephone Encounter (Signed)
Referral ordered in Epic at office visit with Dr Nicki Reaper : 02/27/2020

## 2020-02-27 NOTE — Progress Notes (Addendum)
   Subjective:    Patient ID: [name], male    DOB: [birth year], 69 y.o.   MRN: [id number]  HPI Patient comes in today with complaints of a facial twitch at night for the last 3 nights.  When patient woke up this morning it was a mild twitch but went away once he started getting ready for the day. This patient has a history of stroke.  Patient relates that he was having some intermittent spells in the past couple days where he has a involuntary twitch on the left side of the face that is happens about 1 every second in the last several minutes at a time he denies any numbness or tingling in his left arm or left leg he does have residual problems with left leg weakness from his stroke patient denies any headaches states blood pressure has been under good control.  He also will be starting physical therapy in town.  He is finished up home physical therapy still has weakness in the leg walks with a cane would benefit from more aggressive physical therapy  Review of Systems See above denies any chest tightness pressure pain shortness of breath    Objective:   Physical Exam  No muscle twitching is noted on today's exam lungs are clear respiratory rate normal heart regular no murmurs Left leg weakness noted Fall Risk  01/02/2020 11/15/2019  Falls in the past year? 1 0  Number falls in past yr: 0 -  Injury with Fall? 1 -  Comment concussion -  Risk for fall due to : - Impaired balance/gait;Impaired mobility  Follow up - Falls evaluation completed       Assessment & Plan:  Muscle twitching Because of the frequency and it being in the same area concerning with his history of stroke for the possibility of a scar tissue related seizure or abnormal motor signal  Will connect with neurology hopefully they can get him and potentially do another EEG No need for ER visit at this time  Valium prescribed May use if the twitch becomes persistent and severe caution drowsiness

## 2020-02-27 NOTE — Addendum Note (Signed)
Addended by: Vicente Males on: 02/27/2020 10:26 AM   Modules accepted: Orders

## 2020-02-27 NOTE — Telephone Encounter (Signed)
Referral placed and pt was notified at today's office visit .

## 2020-02-28 LAB — BASIC METABOLIC PANEL
BUN/Creatinine Ratio: 10 (ref 10–24)
BUN: 23 mg/dL (ref 8–27)
CO2: 26 mmol/L (ref 20–29)
Calcium: 10.4 mg/dL — ABNORMAL HIGH (ref 8.6–10.2)
Chloride: 102 mmol/L (ref 96–106)
Creatinine, Ser: 2.26 mg/dL — ABNORMAL HIGH (ref 0.76–1.27)
GFR calc Af Amer: 33 mL/min/{1.73_m2} — ABNORMAL LOW (ref >59)
GFR calc non Af Amer: 29 mL/min/{1.73_m2} — ABNORMAL LOW (ref >59)
Glucose: 85 mg/dL (ref 65–99)
Potassium: 4.3 mmol/L (ref 3.5–5.2)
Sodium: 140 mmol/L (ref 134–144)

## 2020-02-28 LAB — LIPID PANEL
Chol/HDL Ratio: 2.8 ratio (ref 0.0–5.0)
Cholesterol, Total: 130 mg/dL (ref 100–199)
HDL: 47 mg/dL (ref >39)
LDL Chol Calc (NIH): 67 mg/dL (ref 0–99)
Triglycerides: 85 mg/dL (ref 0–149)
VLDL Cholesterol Cal: 16 mg/dL (ref 5–40)

## 2020-02-28 LAB — HEPATIC FUNCTION PANEL
ALT: 40 IU/L (ref 0–44)
AST: 21 IU/L (ref 0–40)
Albumin: 4.7 g/dL (ref 3.8–4.8)
Alkaline Phosphatase: 40 IU/L (ref 39–117)
Bilirubin Total: 0.4 mg/dL (ref 0.0–1.2)
Bilirubin, Direct: 0.11 mg/dL (ref 0.00–0.40)
Total Protein: 7.6 g/dL (ref 6.0–8.5)

## 2020-02-28 LAB — PSA: Prostate Specific Ag, Serum: 4.2 ng/mL — ABNORMAL HIGH (ref 0.0–4.0)

## 2020-03-02 ENCOUNTER — Encounter: Payer: Self-pay | Admitting: Family Medicine

## 2020-03-03 DIAGNOSIS — U071 COVID-19: Secondary | ICD-10-CM | POA: Diagnosis not present

## 2020-03-03 DIAGNOSIS — L89153 Pressure ulcer of sacral region, stage 3: Secondary | ICD-10-CM | POA: Diagnosis not present

## 2020-03-03 DIAGNOSIS — M6281 Muscle weakness (generalized): Secondary | ICD-10-CM | POA: Diagnosis not present

## 2020-03-05 ENCOUNTER — Encounter (HOSPITAL_COMMUNITY): Payer: Self-pay | Admitting: Physical Therapy

## 2020-03-05 ENCOUNTER — Other Ambulatory Visit: Payer: Self-pay

## 2020-03-05 ENCOUNTER — Ambulatory Visit (HOSPITAL_COMMUNITY): Payer: Medicare HMO | Attending: Family Medicine | Admitting: Physical Therapy

## 2020-03-05 DIAGNOSIS — R2689 Other abnormalities of gait and mobility: Secondary | ICD-10-CM | POA: Diagnosis not present

## 2020-03-05 DIAGNOSIS — M6281 Muscle weakness (generalized): Secondary | ICD-10-CM | POA: Diagnosis not present

## 2020-03-05 NOTE — Therapy (Signed)
[place], [place], [address] Phone: [phone]   Fax:  [phone]  Physical Therapy Evaluation  Patient Details  Name: [name] MRN: [id number] Date of Birth: Jan 18, 1951 Referring Provider (PT): Sallee Lange MD    Encounter Date: 03/05/2020  PT End of Session - 03/05/20 1213    Visit Number  1    Number of Visits  13    Date for PT Re-Evaluation  04/16/20    Authorization Type  [employer] Medicare (Submitted for 12 visits on 03/05/20 check auth)    Authorization - Visit Number  1    Authorization - Number of Visits  1    Progress Note Due on Visit  10    PT Start Time  1030    PT Stop Time  1115    PT Time Calculation (min)  45 min    Equipment Utilized During Treatment  Gait belt    Activity Tolerance  Patient tolerated treatment well;Patient limited by fatigue    Behavior During Therapy  [facility] for tasks assessed/performed       Past Medical History:  Diagnosis Date  . CKD (chronic kidney disease), stage IV ([place]) 12/21/2019   Followed by [place] [facility].  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 COVID-19   . Heartburn     Past Surgical History:  Procedure Laterality Date  . ACHILLES TENDON SURGERY Right 2012  . [place] TRANSPOSITION  10/13/2019   Procedure: Basilic Vein Transposition Left Arm;  Surgeon: Angelia Mould, MD;  Location: [facility] OR;  Service: Vascular;;  . COLONOSCOPY  2009   IH  . ESOPHAGOGASTRODUODENOSCOPY N/A 12/25/2014   Procedure: ESOPHAGOGASTRODUODENOSCOPY (EGD);  Surgeon: Danie Binder, MD;  [facility];  Service: Endoscopy;  Laterality: N/A;  830am  . ESOPHAGOGASTRODUODENOSCOPY N/A 08/09/2019   Procedure: ESOPHAGOGASTRODUODENOSCOPY (EGD);  Surgeon: Ronald Lobo, MD;  Location: [facility];  Service: Endoscopy;  Laterality: N/A;  Patient is already sedated, so I do not anticipate need for  further sedation.  Okay from my standpoint to do either at the bedside or in the operating room  . HEMOSTASIS CLIP PLACEMENT  08/09/2019   Procedure: HEMOSTASIS CLIP PLACEMENT;  Surgeon: Ronald Lobo, MD;  Location: [facility];  Service: Endoscopy;;  . HEMOSTASIS CONTROL  08/09/2019   Procedure: HEMOSTASIS CONTROL;  Surgeon: Ronald Lobo, MD;  Location: [facility];  Service: Endoscopy;;  epi   . IR FLUORO GUIDE CV LINE LEFT  09/02/2019  . IR REMOVAL TUN CV CATH W/O FL  10/29/2019  . IR US GUIDE VASC ACCESS LEFT  09/02/2019  . KNEE SURGERY Left 1980's  . none    . WRIST SURGERY Left 1970's    There were no vitals filed for this visit.   Subjective Assessment - 03/05/20 1041    Subjective  Patient presents to physically therapy with complaint of LT sided weakness s/p CVA on 07/25/19. Patient says he was in hospital for 4 months and had inpatient physical therapy. Patient says he also had home health therapy for a little while, but feels he is not quite where he wants to be with strength and functional mobility. Says home health was helpful for progressing him from walker to cane, and improving walking. Patient denies current pain, but does not he was having some pain from a pressure ulcer on his LT side from laying too long. Says he was  being seen by nursing who has helped him and his wife to take care of wound, and that his wife is currently managing this for him.    Limitations  Lifting;Standing;Walking;House hold activities    How long can you stand comfortably?  3 minutes    How long can you walk comfortably?  1.5 blocks with cane    Patient Stated Goals  To walk with no assistance    Currently in Pain?  No/denies         [facility] PT Assessment - 03/05/20 0001      Assessment   Medical Diagnosis  LT sided weakness     Referring Provider (PT)  Sallee Lange MD     Onset Date/Surgical Date  07/25/19    Next MD Visit  --   Not sure   Prior Therapy  Yes, inpatient and [facility]        Precautions   Precautions  Fall      Restrictions   Weight Bearing Restrictions  No      Balance Screen   Has the patient fallen in the past 6 months  Yes    How many times?  1    Has the patient had a decrease in activity level because of a fear of falling?   No    Is the patient reluctant to leave their home because of a fear of falling?   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[employer]   Overall Cognitive Status  Within [place] for tasks assessed      Sensation   Light Touch  Impaired by gross assessment   decreased sensation to LT about LLE      ROM / Strength   AROM / PROM / Strength  AROM;Strength      AROM   Overall AROM Comments  good bilateral hip and knee AROM, moderate restriction in AROM hip extension, likely due to weakness      Strength   Strength Assessment Site  Hip;Ankle;Knee    Right/Left Hip  Right;Left    Right Hip Flexion  4+/5    Right Hip Extension  4-/5    Right Hip ABduction  4/5    Left Hip Flexion  4-/5    Left Hip Extension  2+/5    Left Hip ABduction  3-/5    Right/Left Knee  Right;Left    Right Knee Flexion  4+/5    Right Knee Extension  5/5    Left Knee Flexion  4/5    Left Knee Extension  4-/5    Right/Left Ankle  Right;Left    Right Ankle Dorsiflexion  4+/5    Left Ankle Dorsiflexion  --   Currently donning AFO for foot drop      Transfers   Five time sit to stand comments   13 sec with no UE, uses legs against chair, and decreased control on descent       Ambulation/Gait   Ambulation/Gait  Yes    Ambulation/Gait Assistance  4: Min guard    Ambulation Distance (Feet)  340 Feet    Assistive  device  Straight cane    Gait Pattern  Decreased arm swing - left;Decreased step length - left;Decreased stance time - left;Decreased stride length;Decreased dorsiflexion - left    Ambulation Surface  Level;Indoor    Gait Comments  2MWT      Balance   Balance Assessed  Yes      Static Standing Balance   Static Standing Balance -  Activities   Tandam Stance - Right Leg;Tandam Stance - Left Leg    Static Standing - Comment/# of Minutes  4 sec; 7 sec                   Objective measurements completed on examination: See above findings.              PT Education - 03/05/20 1042    Education Details  On evaluation findings and POC    Person(s) Educated  Patient    Methods  Explanation    Comprehension  Verbalized understanding       PT Short Term Goals - 03/05/20 1219      PT SHORT TERM GOAL #1   Title  Patient will be independent with initial HEP and self-management strategies to improve functional outcomes    Time  3    Period  Weeks    Status  New    Target Date  03/26/20      PT SHORT TERM GOAL #2   Title  Patient will report at least 50% overall improvement in subjective complaint to indicate improvement in ability to perform ADLs.    Time  3    Period  Weeks    Status  New    Target Date  03/26/20        PT Long Term Goals - 03/05/20 1220      PT LONG TERM GOAL #1   Title  Patient will report at least 75% overall improvement in subjective complaint to indicate improvement in ability to perform ADLs.    Time  6    Period  Weeks    Status  New    Target Date  04/16/20      PT LONG TERM GOAL #2   Title  Patient will have equal to or > 4/5 MMT throughout BLEs (except LT ankle DF) to improve ability to perform functional mobility, stair ambulation and ADLs.    Time  6    Period  Weeks    Status  New      PT LONG TERM GOAL #3   Title  Patient will be able to ambulate at least 425 feet during 2MWT with LRAD to demonstrate improved ability to  perform functional mobility and associated tasks.    Time  6    Period  Weeks    Status  New    Target Date  04/16/20      PT LONG TERM GOAL #4   Title  Patient will be able to maintain semi tandem stance >30 seconds on BLEs to improve stability and reduce risk for falls    Time  6    Period  Weeks    Status  New    Target Date  04/16/20             Plan - 03/05/20 1215    Clinical Impression Statement  Patient is a 69 y.o. male who presents to physical therapy  with complaint of LT sided weakness s/p CVA on 07/25/19. Patient demonstrates decreased strength, balance deficits and gait abnormalities which are negatively impacting patient ability to perform ADLs and functional mobility tasks. Patient will benefit from skilled physical therapy services to address these deficits to improve level of function with ADLs, functional mobility tasks, and reduce risk for falls.    Examination-Activity Limitations  Stand;Lift;Locomotion Level;Transfers;Carry;Squat;Stairs    Examination-Participation Restrictions  Yard Work;Cleaning;Community Activity;Laundry;Volunteer    Stability/Clinical Decision Making  Stable/Uncomplicated    Clinical Decision Making  Low    Rehab Potential  Good    PT Frequency  2x / week    PT Duration  6 weeks    PT Treatment/Interventions  ADLs/Self Care Home Management;Aquatic Therapy;Biofeedback;Cryotherapy;Electrical Stimulation;Contrast Bath;Therapeutic exercise;Orthotic Fit/Training;Patient/family education;Manual lymph drainage;Compression bandaging;Taping;Vasopneumatic Device;Splinting;Joint Manipulations;Spinal Manipulations;Energy conservation;Dry needling;Passive range of motion;Manual techniques;Neuromuscular re-education;Balance training;DME Instruction;Gait training;Radiographer, therapeutic;Iontophoresis 4mg /ml Dexamethasone;Moist Heat;Traction;Functional mobility training;Ultrasound;Parrafin;Fluidtherapy;Therapeutic activities    PT Next Visit  Plan  Review goals, issue HEP. HEP to consist of sit to stand, standing hip abduction with support, staggered stance with support. Progress functional strength, gait and balance as tolerated    PT Home Exercise Plan  Issue next visit    Consulted and Agree with Plan of Care  Patient       Patient will benefit from skilled therapeutic intervention in order to improve the following deficits and impairments:  Abnormal gait, Impaired sensation, Decreased activity tolerance, Decreased endurance, Decreased strength, Decreased balance, Difficulty walking, Decreased mobility  Visit Diagnosis: Muscle weakness (generalized)  Other abnormalities of gait and mobility     Problem List Patient Active Problem List   Diagnosis Date Noted  . CKD (chronic kidney disease), stage IV ([place]) 12/21/2019  . Sleep disturbance   . Anemia of chronic disease   . Dependent on hemodialysis ([place])   . Dysphagia, post-stroke   . Pressure injury of sacral region, unstageable ([place])   . Oropharyngeal dysphagia 10/03/2019  . Debility 10/03/2019  . Acute blood loss anemia 09/06/2019  . H. pylori infection 09/06/2019  . CVA (cerebral vascular accident) ([place]) 09/06/2019  . Anoxic brain injury ([place]) 09/06/2019  . MSSA bacteremia 09/06/2019  . Positive D dimer 09/06/2019  . Pseudomonas infection 09/06/2019  . Infection due to acinetobacter baumannii 09/06/2019  . Sinus tachycardia 09/05/2019  . Advanced care planning/counseling discussion   . Goals of care, counseling/discussion   . Palliative care by specialist   . Cerebral thrombosis with cerebral infarction 08/28/2019  . Tracheostomy status ([place])   . Acute respiratory distress syndrome (ARDS) due to COVID-19 virus ([place])   . Chest tube in place   . Primary spontaneous pneumothorax   . Acute respiratory failure ([place])   . Gastric ulcer with hemorrhage 08/10/2019  . Constipation 08/09/2019  . Atrial fibrillation with RVR ([place]) 08/07/2019  . Acute renal failure  (ARF) ([place]) 08/06/2019  . GI bleed 08/06/2019  . Pneumothorax on right   . Fluid overload 07/27/2019  . Pneumonia due to COVID-19 virus 07/25/2019  . Acute respiratory failure due to COVID-19 ([place]) 07/25/2019  . AKI (acute kidney injury) ([place]) 07/25/2019  . Dyspepsia 11/26/2014  . BPH (benign prostatic hyperplasia) 10/31/2013  . Achilles rupture 04/05/2011  . S/P Achilles tendon repair 04/05/2011    12:26 PM, 03/05/20 Josue Hector PT DPT  Physical Therapist with [facility]  [phone] [place] [address] [place], [place], [address] Phone: [phone]  Fax:  [phone]  Name: [name] MRN: [id number] Date of Birth: 05-23-1951

## 2020-03-09 ENCOUNTER — Encounter (HOSPITAL_COMMUNITY): Payer: Medicare HMO

## 2020-03-09 ENCOUNTER — Ambulatory Visit: Payer: Medicare HMO

## 2020-03-09 ENCOUNTER — Ambulatory Visit (HOSPITAL_COMMUNITY): Payer: Medicare HMO

## 2020-03-09 ENCOUNTER — Encounter (HOSPITAL_COMMUNITY): Payer: Self-pay

## 2020-03-09 ENCOUNTER — Other Ambulatory Visit: Payer: Self-pay

## 2020-03-09 DIAGNOSIS — M6281 Muscle weakness (generalized): Secondary | ICD-10-CM | POA: Diagnosis not present

## 2020-03-09 DIAGNOSIS — R2689 Other abnormalities of gait and mobility: Secondary | ICD-10-CM | POA: Diagnosis not present

## 2020-03-09 NOTE — Patient Instructions (Signed)
Functional Quadriceps: Sit to Stand    Sit on edge of chair, feet flat on floor. Stand upright, extending knees fully.  Sit down as slow and controlled as you can.   Repeat 5 times per set. Do 2-3 sets per session. Do 2 sessions per day.  http://orth.exer.us/734   Copyright  VHI. All rights reserved.   ABDUCTION: Standing (Active)    Stand, feet flat. Lift right leg out to side.  Complete 2 sets of 10 repetitions.   http://gtsc.exer.us/110   Copyright  VHI. All rights reserved.   Tandem Stance   Stand with partial tandem stance (not ready for heel to toes)  Right foot in front of left, heel touching toe both feet "straight ahead". Stand on Foot Triangle of Support with both feet. Balance in this position 30 seconds. Do with left foot in front of right.  Copyright  VHI. All rights reserved.

## 2020-03-09 NOTE — Therapy (Signed)
[place], [place], [address] Phone: [phone]   Fax:  [phone]  Physical Therapy Treatment  Patient Details  Name: [name] MRN: [id number] Date of Birth: May 26, 1951 Referring Provider (PT): Sallee Lange MD    Encounter Date: 03/09/2020  PT End of Session - 03/09/20 1524    Visit Number  2    Number of Visits  13    Date for PT Re-Evaluation  04/16/20    Authorization Type  [employer] Approved 12 visits    Authorization Time Period  05/14-->6/25    Authorization - Visit Number  1    Authorization - Number of Visits  12    Progress Note Due on Visit  10    PT Start Time  1450    PT Stop Time  1528    PT Time Calculation (min)  38 min    Equipment Utilized During Treatment  Gait belt    Activity Tolerance  Patient tolerated treatment well;Patient limited by fatigue    Behavior During Therapy  WFL for tasks assessed/performed       Past Medical History:  Diagnosis Date  . CKD (chronic kidney disease), stage IV ([place]) 12/21/2019   Followed by [place] [facility].  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 COVID-19   . Heartburn     Past Surgical History:  Procedure Laterality Date  . ACHILLES TENDON SURGERY Right 2012  . [place] TRANSPOSITION  10/13/2019   Procedure: Basilic Vein Transposition Left Arm;  Surgeon: Angelia Mould, MD;  Location: [facility] OR;  Service: Vascular;;  . COLONOSCOPY  2009   IH  . ESOPHAGOGASTRODUODENOSCOPY N/A 12/25/2014   Procedure: ESOPHAGOGASTRODUODENOSCOPY (EGD);  Surgeon: Danie Binder, MD;  [facility];  Service: Endoscopy;  Laterality: N/A;  830am  . ESOPHAGOGASTRODUODENOSCOPY N/A 08/09/2019   Procedure: ESOPHAGOGASTRODUODENOSCOPY (EGD);  Surgeon: Ronald Lobo, MD;  Location: [facility];  Service: Endoscopy;  Laterality: N/A;  Patient is already sedated, so I do not anticipate  need for further sedation.  Okay from my standpoint to do either at the bedside or in the operating room  . HEMOSTASIS CLIP PLACEMENT  08/09/2019   Procedure: HEMOSTASIS CLIP PLACEMENT;  Surgeon: Ronald Lobo, MD;  [facility]: [facility];  Service: Endoscopy;;  . HEMOSTASIS CONTROL  08/09/2019   Procedure: HEMOSTASIS CONTROL;  Surgeon: Ronald Lobo, MD;  [facility]: [facility];  Service: Endoscopy;;  epi   . IR FLUORO GUIDE CV LINE LEFT  09/02/2019  . IR REMOVAL TUN CV CATH W/O FL  10/29/2019  . IR US GUIDE VASC ACCESS LEFT  09/02/2019  . KNEE SURGERY Left 1980's  . none    . WRIST SURGERY Left 1970's    There were no vitals filed for this visit.  Subjective Assessment - 03/09/20 1452    Subjective  Pt arrived wiht walking stick.  Pt stated he is feeling good today.  No reports of pain or recent falls.  Did reports some difficulty with Lt hand.    Patient Stated Goals  To walk with no assistance    Currently in Pain?  No/denies                        [facility] Adult PT Treatment/Exercise - 03/09/20 0001      Exercises   Exercises  Knee/Hip      Knee/Hip Exercises:  Standing   Hip Abduction  Both;10 reps;Knee straight    Functional Squat  2 sets;5 reps    Functional Squat Limitations  front of chair, cueing for mechanics    Other Standing Knee Exercises  partial tandem stance 3x 30"      Knee/Hip Exercises: Seated   Sit to Sand  5 reps;without UE support;3 sets   eccentric control, cueing for mechanics             PT Education - 03/09/20 1637    Education Details  Reviewed goals, educated importance of HEP and established this session.  Pt educated on brain injury support group in the community.    Person(s) Educated  Patient    Methods  Explanation;Demonstration;Verbal cues;Handout    Comprehension  Verbalized understanding       PT Short Term Goals - 03/05/20 1219      PT SHORT TERM GOAL #1   Title  Patient will be independent with initial HEP  and self-management strategies to improve functional outcomes    Time  3    Period  Weeks    Status  New    Target Date  03/26/20      PT SHORT TERM GOAL #2   Title  Patient will report at least 50% overall improvement in subjective complaint to indicate improvement in ability to perform ADLs.    Time  3    Period  Weeks    Status  New    Target Date  03/26/20        PT Long Term Goals - 03/05/20 1220      PT LONG TERM GOAL #1   Title  Patient will report at least 75% overall improvement in subjective complaint to indicate improvement in ability to perform ADLs.    Time  6    Period  Weeks    Status  New    Target Date  04/16/20      PT LONG TERM GOAL #2   Title  Patient will have equal to or > 4/5 MMT throughout BLEs (except LT ankle DF) to improve ability to perform functional mobility, stair ambulation and ADLs.    Time  6    Period  Weeks    Status  New      PT LONG TERM GOAL #3   Title  Patient will be able to ambulate at least 425 feet during 2MWT with LRAD to demonstrate improved ability to perform functional mobility and associated tasks.    Time  6    Period  Weeks    Status  New    Target Date  04/16/20      PT LONG TERM GOAL #4   Title  Patient will be able to maintain semi tandem stance >30 seconds on BLEs to improve stability and reduce risk for falls    Time  6    Period  Weeks    Status  New    Target Date  04/16/20            Plan - 03/09/20 1633    Clinical Impression Statement  Reviewed goals, educated importance of HEP compliance and established this session.  Session focus with LE strengthening and static balance.  Reviewed mechanics wiht STS and encouraged eccentric control with some difficulty noted due to weakness.  Pt encouraged to complete standing exercises with HHA by counter, verbalized understanding.    Examination-Activity Limitations  Stand;Lift;Locomotion Level;Transfers;Carry;Squat;Stairs    Examination-Participation Restrictions  Yard Work;Cleaning;Community Activity;Laundry;Volunteer    Stability/Clinical Decision Making  Stable/Uncomplicated    Rehab Potential  Good    PT Frequency  2x / week    PT Duration  6 weeks    PT Treatment/Interventions  ADLs/Self Care Home Management;Aquatic Therapy;Biofeedback;Cryotherapy;Electrical Stimulation;Contrast Bath;Therapeutic exercise;Orthotic Fit/Training;Patient/family education;Manual lymph drainage;Compression bandaging;Taping;Vasopneumatic Device;Splinting;Joint Manipulations;Spinal Manipulations;Energy conservation;Dry needling;Passive range of motion;Manual techniques;Neuromuscular re-education;Balance training;DME Instruction;Gait training;Radiographer, therapeutic;Iontophoresis 4mg /ml Dexamethasone;Moist Heat;Traction;Functional mobility training;Ultrasound;Parrafin;Fluidtherapy;Therapeutic activities    PT Next Visit Plan  Review HEP compliance.  Progress functional strength, gait and balance as tolerated.    PT Home Exercise Plan  5/18: STS, standing hip abduction and staggered tandem stance with UE support.       Patient will benefit from skilled therapeutic intervention in order to improve the following deficits and impairments:  Abnormal gait, Impaired sensation, Decreased activity tolerance, Decreased endurance, Decreased strength, Decreased balance, Difficulty walking, Decreased mobility  Visit Diagnosis: Muscle weakness (generalized)  Other abnormalities of gait and mobility     Problem List Patient Active Problem List   Diagnosis Date Noted  . CKD (chronic kidney disease), stage IV ([place]) 12/21/2019  . Sleep disturbance   . Anemia of chronic disease   . Dependent on hemodialysis ([place])   . Dysphagia, post-stroke   . Pressure injury of sacral region, unstageable ([place])   . Oropharyngeal dysphagia 10/03/2019  . Debility 10/03/2019  . Acute blood loss anemia 09/06/2019  . H. pylori infection 09/06/2019  . CVA (cerebral vascular accident)  ([place]) 09/06/2019  . Anoxic brain injury ([place]) 09/06/2019  . MSSA bacteremia 09/06/2019  . Positive D dimer 09/06/2019  . Pseudomonas infection 09/06/2019  . Infection due to acinetobacter baumannii 09/06/2019  . Sinus tachycardia 09/05/2019  . Advanced care planning/counseling discussion   . Goals of care, counseling/discussion   . Palliative care by specialist   . Cerebral thrombosis with cerebral infarction 08/28/2019  . Tracheostomy status ([place])   . Acute respiratory distress syndrome (ARDS) due to COVID-19 virus ([place])   . Chest tube in place   . Primary spontaneous pneumothorax   . Acute respiratory failure ([place])   . Gastric ulcer with hemorrhage 08/10/2019  . Constipation 08/09/2019  . Atrial fibrillation with RVR ([place]) 08/07/2019  . Acute renal failure (ARF) ([place]) 08/06/2019  . GI bleed 08/06/2019  . Pneumothorax on right   . Fluid overload 07/27/2019  . Pneumonia due to COVID-19 virus 07/25/2019  . Acute respiratory failure due to COVID-19 ([place]) 07/25/2019  . AKI (acute kidney injury) ([place]) 07/25/2019  . Dyspepsia 11/26/2014  . BPH (benign prostatic hyperplasia) 10/31/2013  . Achilles rupture 04/05/2011  . S/P Achilles tendon repair 04/05/2011   Ihor Austin, LPTA/CLT; CBIS [phone]  Aldona Lento 03/09/2020, 4:38 PM  [facility] [address] [place], [place], [address] Phone: [phone]   Fax:  [phone]  Name: [name] MRN: [id number] Date of Birth: 06-29-1951

## 2020-03-15 ENCOUNTER — Ambulatory Visit (HOSPITAL_COMMUNITY): Payer: Medicare HMO | Admitting: Physical Therapy

## 2020-03-15 ENCOUNTER — Other Ambulatory Visit: Payer: Self-pay

## 2020-03-15 DIAGNOSIS — R2689 Other abnormalities of gait and mobility: Secondary | ICD-10-CM | POA: Diagnosis not present

## 2020-03-15 DIAGNOSIS — M6281 Muscle weakness (generalized): Secondary | ICD-10-CM | POA: Diagnosis not present

## 2020-03-15 NOTE — Therapy (Signed)
[place], [place], [address] Phone: [phone]   Fax:  [phone]  Physical Therapy Treatment  Patient Details  Name: [name] MRN: [id number] Date of Birth: 11-28-1950 Referring Provider (PT): Sallee Lange MD    Encounter Date: 03/15/2020  PT End of Session - 03/15/20 1149    Visit Number  3    Number of Visits  13    Date for PT Re-Evaluation  04/16/20    Authorization Type  [employer] Approved 12 visits    Authorization Time Period  05/14-->6/25    Authorization - Visit Number  2    Authorization - Number of Visits  12    Progress Note Due on Visit  10    PT Start Time  1053    PT Stop Time  1133    PT Time Calculation (min)  40 min    Equipment Utilized During Treatment  Gait belt    Activity Tolerance  Patient tolerated treatment well;Patient limited by fatigue    Behavior During Therapy  [facility] for tasks assessed/performed       Past Medical History:  Diagnosis Date  . CKD (chronic kidney disease), stage IV ([place]) 12/21/2019   Followed by [place] [facility].  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 COVID-19   . Heartburn     Past Surgical History:  Procedure Laterality Date  . ACHILLES TENDON SURGERY Right 2012  . [place] TRANSPOSITION  10/13/2019   Procedure: Basilic Vein Transposition Left Arm;  Surgeon: Angelia Mould, MD;  Location: [facility] OR;  Service: Vascular;;  . COLONOSCOPY  2009   IH  . ESOPHAGOGASTRODUODENOSCOPY N/A 12/25/2014   Procedure: ESOPHAGOGASTRODUODENOSCOPY (EGD);  Surgeon: Danie Binder, MD;  [facility];  Service: Endoscopy;  Laterality: N/A;  830am  . ESOPHAGOGASTRODUODENOSCOPY N/A 08/09/2019   Procedure: ESOPHAGOGASTRODUODENOSCOPY (EGD);  Surgeon: Ronald Lobo, MD;  Location: [facility];  Service: Endoscopy;  Laterality: N/A;  Patient is already sedated, so I do not anticipate  need for further sedation.  Okay from my standpoint to do either at the bedside or in the operating room  . HEMOSTASIS CLIP PLACEMENT  08/09/2019   Procedure: HEMOSTASIS CLIP PLACEMENT;  Surgeon: Ronald Lobo, MD;  Location: [facility]OSCOPY;  Service: Endoscopy;;  . HEMOSTASIS CONTROL  08/09/2019   Procedure: HEMOSTASIS CONTROL;  Surgeon: Ronald Lobo, MD;  Location: [facility] ENDOSCOPY;  Service: Endoscopy;;  epi   . IR FLUORO GUIDE CV LINE LEFT  09/02/2019  . IR REMOVAL TUN CV CATH W/O FL  10/29/2019  . IR US GUIDE VASC ACCESS LEFT  09/02/2019  . KNEE SURGERY Left 1980's  . none    . WRIST SURGERY Left 1970's    There were no vitals filed for this visit.  Subjective Assessment - 03/15/20 1056    Subjective  Pt states he done some work in the yard over the weekend.  done some pressure washing as well.  Used his golf cart to assist with mobility.  No issues or pain today.    Currently in Pain?  No/denies                        [facility] Adult PT Treatment/Exercise - 03/15/20 0001      Knee/Hip Exercises: Standing   Heel Raises  Both;10 reps    Forward Lunges  Both;10 reps;Limitations  Forward Lunges Limitations  onto 4" step and 1 UE    Hip Abduction  Both;Knee straight;15 reps    Hip Extension  Both;15 reps;Knee straight    Functional Squat  2 sets;10 reps    Functional Squat Limitations  cues for keeping heels down    SLS  max tries out of 3 Rt: 6"  Lt: 3"     SLS with Vectors  both with 1 UE assist 5X3" each    Other Standing Knee Exercises   tandem stance 2x 30"    Other Standing Knee Exercises  side stepping on line with cane and feet straight 2RT      Knee/Hip Exercises: Seated   Long Arc Quad  Both;10 reps    Long Arc Quad Limitations  5" hlds    Sit to [employer]  10 reps;without UE support               PT Short Term Goals - 03/05/20 1219      PT SHORT TERM GOAL #1   Title  Patient will be independent with initial HEP and self-management strategies to  improve functional outcomes    Time  3    Period  Weeks    Status  New    Target Date  03/26/20      PT SHORT TERM GOAL #2   Title  Patient will report at least 50% overall improvement in subjective complaint to indicate improvement in ability to perform ADLs.    Time  3    Period  Weeks    Status  New    Target Date  03/26/20        PT Long Term Goals - 03/05/20 1220      PT LONG TERM GOAL #1   Title  Patient will report at least 75% overall improvement in subjective complaint to indicate improvement in ability to perform ADLs.    Time  6    Period  Weeks    Status  New    Target Date  04/16/20      PT LONG TERM GOAL #2   Title  Patient will have equal to or > 4/5 MMT throughout BLEs (except LT ankle DF) to improve ability to perform functional mobility, stair ambulation and ADLs.    Time  6    Period  Weeks    Status  New      PT LONG TERM GOAL #3   Title  Patient will be able to ambulate at least 425 feet during 2MWT with LRAD to demonstrate improved ability to perform functional mobility and associated tasks.    Time  6    Period  Weeks    Status  New    Target Date  04/16/20      PT LONG TERM GOAL #4   Title  Patient will be able to maintain semi tandem stance >30 seconds on BLEs to improve stability and reduce risk for falls    Time  6    Period  Weeks    Status  New    Target Date  04/16/20            Plan - 03/15/20 1150    Clinical Impression Statement  Continued with LE strengtheing with additional therex added to challenge stabiltiy.  Pt with manual and verbal cues for general form as tends to shift weight onto toes with lunges/squats.  Vectors added to strengthening LE and improve SLS challenge.  more difficult to  complete vectors with Lt back as this is the weaker LE.  side stepping added using SPC with cues to keep feet ahead.    Examination-Activity Limitations  Stand;Lift;Locomotion Level;Transfers;Carry;Squat;Stairs    Examination-Participation  Restrictions  Yard Work;Cleaning;Community Activity;Laundry;Volunteer    Stability/Clinical Decision Making  Stable/Uncomplicated    Rehab Potential  Good    PT Frequency  2x / week    PT Duration  6 weeks    PT Treatment/Interventions  ADLs/Self Care Home Management;Aquatic Therapy;Biofeedback;Cryotherapy;Electrical Stimulation;Contrast Bath;Therapeutic exercise;Orthotic Fit/Training;Patient/family education;Manual lymph drainage;Compression bandaging;Taping;Vasopneumatic Device;Splinting;Joint Manipulations;Spinal Manipulations;Energy conservation;Dry needling;Passive range of motion;Manual techniques;Neuromuscular re-education;Balance training;DME Instruction;Gait training;Radiographer, therapeutic;Iontophoresis 4mg /ml Dexamethasone;Moist Heat;Traction;Functional mobility training;Ultrasound;Parrafin;Fluidtherapy;Therapeutic activities    PT Next Visit Plan  Review HEP compliance.  Progress functional strength, gait and balance as tolerated.    PT Home Exercise Plan  5/18: STS, standing hip abduction and staggered tandem stance with UE support.       Patient will benefit from skilled therapeutic intervention in order to improve the following deficits and impairments:  Abnormal gait, Impaired sensation, Decreased activity tolerance, Decreased endurance, Decreased strength, Decreased balance, Difficulty walking, Decreased mobility  Visit Diagnosis: Muscle weakness (generalized)  Other abnormalities of gait and mobility     Problem List Patient Active Problem List   Diagnosis Date Noted  . CKD (chronic kidney disease), stage IV ([place]) 12/21/2019  . Sleep disturbance   . Anemia of chronic disease   . Dependent on hemodialysis ([place])   . Dysphagia, post-stroke   . Pressure injury of sacral region, unstageable ([place])   . Oropharyngeal dysphagia 10/03/2019  . Debility 10/03/2019  . Acute blood loss anemia 09/06/2019  . H. pylori infection 09/06/2019  . CVA (cerebral  vascular accident) ([place]) 09/06/2019  . Anoxic brain injury ([place]) 09/06/2019  . MSSA bacteremia 09/06/2019  . Positive D dimer 09/06/2019  . Pseudomonas infection 09/06/2019  . Infection due to acinetobacter baumannii 09/06/2019  . Sinus tachycardia 09/05/2019  . Advanced care planning/counseling discussion   . Goals of care, counseling/discussion   . Palliative care by specialist   . Cerebral thrombosis with cerebral infarction 08/28/2019  . Tracheostomy status ([place])   . Acute respiratory distress syndrome (ARDS) due to COVID-19 virus ([place])   . Chest tube in place   . Primary spontaneous pneumothorax   . Acute respiratory failure ([place])   . Gastric ulcer with hemorrhage 08/10/2019  . Constipation 08/09/2019  . Atrial fibrillation with RVR ([place]) 08/07/2019  . Acute renal failure (ARF) ([place]) 08/06/2019  . GI bleed 08/06/2019  . Pneumothorax on right   . Fluid overload 07/27/2019  . Pneumonia due to COVID-19 virus 07/25/2019  . Acute respiratory failure due to COVID-19 ([place]) 07/25/2019  . AKI (acute kidney injury) ([place]) 07/25/2019  . Dyspepsia 11/26/2014  . BPH (benign prostatic hyperplasia) 10/31/2013  . Achilles rupture 04/05/2011  . S/P Achilles tendon repair 04/05/2011   Teena Irani, PTA/CLT [phone]  Teena Irani 03/15/2020, 11:57 AM  [place], [place], [address] Phone: [phone]   Fax:  [phone]  Name: [name] MRN: [id number] Date of Birth: [birth year]

## 2020-03-19 ENCOUNTER — Encounter (HOSPITAL_COMMUNITY): Payer: Self-pay | Admitting: Physical Therapy

## 2020-03-19 ENCOUNTER — Ambulatory Visit (HOSPITAL_COMMUNITY): Payer: Medicare HMO | Admitting: Physical Therapy

## 2020-03-19 ENCOUNTER — Other Ambulatory Visit: Payer: Self-pay

## 2020-03-19 DIAGNOSIS — R2689 Other abnormalities of gait and mobility: Secondary | ICD-10-CM | POA: Diagnosis not present

## 2020-03-19 DIAGNOSIS — M6281 Muscle weakness (generalized): Secondary | ICD-10-CM

## 2020-03-19 NOTE — Therapy (Signed)
[place], [place], [address] Phone: [phone]   Fax:  [phone]  Physical Therapy Treatment  Patient Details  Name: [name] MRN: [id number] Date of Birth: Mar 11, 1951 Referring Provider (PT): Sallee Lange MD    Encounter Date: 03/19/2020  PT End of Session - 03/19/20 1512    Visit Number  4    Number of Visits  13    Date for PT Re-Evaluation  04/16/20    Authorization Type  [employer] Approved 12 visits    Authorization Time Period  05/14-->6/25    Authorization - Visit Number  3    Authorization - Number of Visits  12    Progress Note Due on Visit  10    PT Start Time  1431    PT Stop Time  1510    PT Time Calculation (min)  39 min    Equipment Utilized During Treatment  Gait belt    Activity Tolerance  Patient tolerated treatment well    Behavior During Therapy  [facility] for tasks assessed/performed       Past Medical History:  Diagnosis Date  . CKD (chronic kidney disease), stage IV ([place]) 12/21/2019   Followed by [place] [facility].  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 COVID-19   . Heartburn     Past Surgical History:  Procedure Laterality Date  . ACHILLES TENDON SURGERY Right 2012  . [place] TRANSPOSITION  10/13/2019   Procedure: Basilic Vein Transposition Left Arm;  Surgeon: Angelia Mould, MD;  Location: [facility] OR;  Service: Vascular;;  . COLONOSCOPY  2009   IH  . ESOPHAGOGASTRODUODENOSCOPY N/A 12/25/2014   Procedure: ESOPHAGOGASTRODUODENOSCOPY (EGD);  Surgeon: Danie Binder, MD;  [facility];  Service: Endoscopy;  Laterality: N/A;  830am  . ESOPHAGOGASTRODUODENOSCOPY N/A 08/09/2019   Procedure: ESOPHAGOGASTRODUODENOSCOPY (EGD);  Surgeon: Ronald Lobo, MD;  Location: [facility];  Service: Endoscopy;  Laterality: N/A;  Patient is already sedated, so I do not anticipate need for further sedation.   Okay from my standpoint to do either at the bedside or in the operating room  . HEMOSTASIS CLIP PLACEMENT  08/09/2019   Procedure: HEMOSTASIS CLIP PLACEMENT;  Surgeon: Ronald Lobo, MD;  [facility];  Service: Endoscopy;;  . HEMOSTASIS CONTROL  08/09/2019   Procedure: HEMOSTASIS CONTROL;  Surgeon: Ronald Lobo, MD;  Location: [facility];  Service: Endoscopy;;  epi   . IR FLUORO GUIDE CV LINE LEFT  09/02/2019  . IR REMOVAL TUN CV CATH W/O FL  10/29/2019  . IR US GUIDE VASC ACCESS LEFT  09/02/2019  . KNEE SURGERY Left 1980's  . none    . WRIST SURGERY Left 1970's    There were no vitals filed for this visit.  Subjective Assessment - 03/19/20 1435    Subjective  Patient reported he is feeling good. Denied any pain.    Currently in Pain?  No/denies                        [facility] Adult PT Treatment/Exercise - 03/19/20 0001      Knee/Hip Exercises: Standing   Heel Raises  Both;10 reps    Forward Lunges  Both;10 reps;Limitations    Forward Lunges Limitations  onto 4" step and 1 UE    Hip Abduction  Both;Knee straight;15 reps    Hip Extension  Both;15 reps;Knee  straight    Other Standing Knee Exercises   tandem stance 2x 30"    Other Standing Knee Exercises  Ambulation around clinic with head turns 226'x1 verbal cues to turn and answer questions about pictures on wall      Knee/Hip Exercises: Seated   Long Arc Quad  Both;10 reps    Long Arc Quad Limitations  5" hlds    Knee/Hip Flexion  Marching over 6'' hurdle 2x10 each LE    Other Seated Knee/Hip Exercises  NBOS bil UE shoulder flexion with 1# bar x 10    Sit to [place]  10 reps;without UE support   mat table and then 10x stepping toward cones              PT Short Term Goals - 03/05/20 1219      PT SHORT TERM GOAL #1   Title  Patient will be independent with initial HEP and self-management strategies to improve functional outcomes    Time  3    Period  Weeks    Status  New    Target  Date  03/26/20      PT SHORT TERM GOAL #2   Title  Patient will report at least 50% overall improvement in subjective complaint to indicate improvement in ability to perform ADLs.    Time  3    Period  Weeks    Status  New    Target Date  03/26/20        PT Long Term Goals - 03/05/20 1220      PT LONG TERM GOAL #1   Title  Patient will report at least 75% overall improvement in subjective complaint to indicate improvement in ability to perform ADLs.    Time  6    Period  Weeks    Status  New    Target Date  04/16/20      PT LONG TERM GOAL #2   Title  Patient will have equal to or > 4/5 MMT throughout BLEs (except LT ankle DF) to improve ability to perform functional mobility, stair ambulation and ADLs.    Time  6    Period  Weeks    Status  New      PT LONG TERM GOAL #3   Title  Patient will be able to ambulate at least 425 feet during 2MWT with LRAD to demonstrate improved ability to perform functional mobility and associated tasks.    Time  6    Period  Weeks    Status  New    Target Date  04/16/20      PT LONG TERM GOAL #4   Title  Patient will be able to maintain semi tandem stance >30 seconds on BLEs to improve stability and reduce risk for falls    Time  6    Period  Weeks    Status  New    Target Date  04/16/20            Plan - 03/19/20 1515    Clinical Impression Statement  Focused on balance and strengthening this session. Added sit to stands with stepping towards cones this session. Patient with some dysmetria with LT LE and required frequent cueing initially for proper sequencing of activity. Also added ambulation with head turns this session. Noted a decrease in gait velocity with this when turning head and answering questions about pictures on the wall. Patient would benefit from continued skilled physical therapy in order to continue progressing  towards functional goals.    Examination-Activity Limitations  Stand;Lift;Locomotion  Level;Transfers;Carry;Squat;Stairs    Examination-Participation Restrictions  Yard Work;Cleaning;Community Activity;Laundry;Volunteer    Stability/Clinical Decision Making  Stable/Uncomplicated    Rehab Potential  Good    PT Frequency  2x / week    PT Duration  6 weeks    PT Treatment/Interventions  ADLs/Self Care Home Management;Aquatic Therapy;Biofeedback;Cryotherapy;Electrical Stimulation;Contrast Bath;Therapeutic exercise;Orthotic Fit/Training;Patient/family education;Manual lymph drainage;Compression bandaging;Taping;Vasopneumatic Device;Splinting;Joint Manipulations;Spinal Manipulations;Energy conservation;Dry needling;Passive range of motion;Manual techniques;Neuromuscular re-education;Balance training;DME Instruction;Gait training;Radiographer, therapeutic;Iontophoresis 4mg /ml Dexamethasone;Moist Heat;Traction;Functional mobility training;Ultrasound;Parrafin;Fluidtherapy;Therapeutic activities    PT Next Visit Plan  Review HEP compliance.  Progress functional strength, gait and balance as tolerated.    PT Home Exercise Plan  5/18: STS, standing hip abduction and staggered tandem stance with UE support.       Patient will benefit from skilled therapeutic intervention in order to improve the following deficits and impairments:  Abnormal gait, Impaired sensation, Decreased activity tolerance, Decreased endurance, Decreased strength, Decreased balance, Difficulty walking, Decreased mobility  Visit Diagnosis: Muscle weakness (generalized)  Other abnormalities of gait and mobility     Problem List Patient Active Problem List   Diagnosis Date Noted  . CKD (chronic kidney disease), stage IV ([place]) 12/21/2019  . Sleep disturbance   . Anemia of chronic disease   . Dependent on hemodialysis ([place])   . Dysphagia, post-stroke   . Pressure injury of sacral region, unstageable ([place])   . Oropharyngeal dysphagia 10/03/2019  . Debility 10/03/2019  . Acute blood loss anemia  09/06/2019  . H. pylori infection 09/06/2019  . CVA (cerebral vascular accident) ([place]) 09/06/2019  . Anoxic brain injury ([place]) 09/06/2019  . MSSA bacteremia 09/06/2019  . Positive D dimer 09/06/2019  . Pseudomonas infection 09/06/2019  . Infection due to acinetobacter baumannii 09/06/2019  . Sinus tachycardia 09/05/2019  . Advanced care planning/counseling discussion   . Goals of care, counseling/discussion   . Palliative care by specialist   . Cerebral thrombosis with cerebral infarction 08/28/2019  . Tracheostomy status ([place])   . Acute respiratory distress syndrome (ARDS) due to COVID-19 virus ([place])   . Chest tube in place   . Primary spontaneous pneumothorax   . Acute respiratory failure ([place])   . Gastric ulcer with hemorrhage 08/10/2019  . Constipation 08/09/2019  . Atrial fibrillation with RVR ([place]) 08/07/2019  . Acute renal failure (ARF) ([place]) 08/06/2019  . GI bleed 08/06/2019  . Pneumothorax on right   . Fluid overload 07/27/2019  . Pneumonia due to COVID-19 virus 07/25/2019  . Acute respiratory failure due to COVID-19 ([place]) 07/25/2019  . AKI (acute kidney injury) ([place]) 07/25/2019  . Dyspepsia 11/26/2014  . BPH (benign prostatic hyperplasia) 10/31/2013  . Achilles rupture 04/05/2011  . S/P Achilles tendon repair 04/05/2011   Clarene Critchley PT, DPT 3:17 PM, 03/19/20 [place], [place], [address] Phone: [phone]   Fax:  [phone]  Name: [name] MRN: [id number] Date of Birth: 12-01-1950

## 2020-03-23 ENCOUNTER — Ambulatory Visit: Payer: Medicare HMO | Admitting: Adult Health

## 2020-03-23 ENCOUNTER — Encounter: Payer: Self-pay | Admitting: Adult Health

## 2020-03-23 ENCOUNTER — Encounter (HOSPITAL_COMMUNITY): Payer: Medicare HMO

## 2020-03-23 ENCOUNTER — Other Ambulatory Visit: Payer: Self-pay

## 2020-03-23 VITALS — BP 134/82 | HR 90 | Ht 70.0 in | Wt 153.0 lb

## 2020-03-23 DIAGNOSIS — I4891 Unspecified atrial fibrillation: Secondary | ICD-10-CM

## 2020-03-23 DIAGNOSIS — Z87828 Personal history of other (healed) physical injury and trauma: Secondary | ICD-10-CM

## 2020-03-23 DIAGNOSIS — G514 Facial myokymia: Secondary | ICD-10-CM | POA: Diagnosis not present

## 2020-03-23 DIAGNOSIS — I639 Cerebral infarction, unspecified: Secondary | ICD-10-CM

## 2020-03-23 DIAGNOSIS — I10 Essential (primary) hypertension: Secondary | ICD-10-CM

## 2020-03-23 DIAGNOSIS — E785 Hyperlipidemia, unspecified: Secondary | ICD-10-CM

## 2020-03-23 MED ORDER — ASPIRIN EC 81 MG PO TBEC: 81.0000 mg | DELAYED_RELEASE_TABLET | Freq: Every day | ORAL | Status: DC

## 2020-03-23 NOTE — Patient Instructions (Signed)
Continue aspirin 81 mg daily  and atorvastatin 40mg  daily  for secondary stroke prevention  Continue to follow up with PCP regarding cholesterol and blood pressure management   Continue to work with physical therapy for ongoing mild weakness and imbalance  Continue to monitor facial twitches and ongoing use of valium managed by your PCP. Please discuss further with your PCP or call our office if symptoms worsen or become frequent   Continue to monitor blood pressure at home  Maintain strict control of hypertension with blood pressure goal below 130/90, diabetes with hemoglobin A1c goal below 6.5% and cholesterol with LDL cholesterol (bad cholesterol) goal below 70 mg/dL. I also advised the patient to eat a healthy diet with plenty of whole grains, cereals, fruits and vegetables, exercise regularly and maintain ideal body weight.  Followup in the future with me in 6 months or call earlier if needed       Thank you for coming to see [place] at [facility]. I hope we have been able to provide you high quality care today.  You may receive a patient satisfaction survey over the next few weeks. We would appreciate your feedback and comments so that we may continue to improve ourselves and the health of our patients.

## 2020-03-23 NOTE — Progress Notes (Signed)
[facility] [address] [place]. [place] [address] [phone]       STROKE FOLLOW UP NOTE  [name] Date of Birth:  January 02, 1951 Medical Record Number:  [id number]   Reason for Referral: stroke follow up    CHIEF COMPLAINT:  Chief Complaint  Patient presents with  . Follow-up    stroke fu , pt states he is doing much better and walking more     HPI:  Today, 03/23/2020, [name] returns for follow-up regarding bilateral cerebral cortex and right cerebellum stroke in 08/2019.  He is accompanied by his wife.  Residual deficits of left hemiparesis and imbalance/gait impairment but endorses ongoing improvement.  Currently ambulating with a walking stick.  Recently started working with outpatient PT at [facility].  Complaints of intermittent episodes of left facial twitching lasting several minutes and then subsiding which started approximately 1 month ago.  Wife states "it almost sounded like a hiccup".  Not associated with any other symptoms such as loss of consciousness, altered mental status, fatigue, numbness/tingling, additional weakness, speech difficulty, headache or visual concerns.  He did have follow-up with PCP on 02/27/2020 who initiated Valium 2.5 mg twice daily as needed.  He has only had 1 additional episode since PCP visit with benefit of Valium. Repeat CT head showed resolution of traumatic SDH therefore recommended restarting aspirin 81 mg daily for secondary stroke prevention.  Remains on aspirin and atorvastatin for secondary stroke prevention.  Blood pressure today 134/82.  No concerns at this time.   History provided for reference purposes only Initial visit 12/10/2019 JM: [name] is a 69 year old male who is being seen today for stroke follow-up accompanied by wife. Residual deficits of left hemiparesis with LUE action tremors but endorses ongoing improvment.  He continues to work with home health therapies.  Ambulate short distance with  rolling walker during therapy sessions otherwise transfers via wheelchair.  He unfortunately did have a fall on 11/27/2019 as he attempted to ambulate without assistance which led to subdural hematoma.  He has been doing well since that time and has not had any additional falls.  He does complain of mild headache since that time but are not debilitating.  He has not restarted aspirin since that time as it was recommended to discontinue with SDH.  For unknown reason, he is not currently on atorvastatin as wife was not aware he needed to be on this medication.  Blood pressure today 144/88.  Continues to follow closely with vascular surgery and does have evaluation by cardiology on 3/10.  No further stroke concerns at this time.  Stroke admission 07/25/19: [name] is a 69 y.o. male with history of heartburn who was dx with COVID on 07/13/2019 presented to [facility] on 07/25/2019 for medical issues including VDRF, R pneumothorax, AKI requiring CRRT transition to HD, encephalopathy, MSSA bacteremia with negative echocardiogram, long-term intubation requiring tracheostomy on 08/22/2019 and episodes of A. fib with RVR treated with amiodarone.  Neurology service consulted on 08/27/2019 for altered mental status with MRI showing small acute/early subacute infarction bilateral cerebral cortex and right cerebellum and transferred to [facility] for further medical management.  Stroke etiology likely due to hypotension secondary to dialysis and sepsis with deconditioning as well as PAF possible etiology.  In addition to acute infarcts, MRI also showed bilateral frontal gyrus ribboning R>L likely due to hypoxia and hypotension consistent with anoxic brain injury.  LE venous Doppler and 2D echo unremarkable.  EEG diffuse encephalopathy  but no seizure.  LDL 149 and initiated atorvastatin 40 mg daily.  A1c 6.1. initiated aspirin 325mg  daily and eventually decreased to 81mg  daily.  Complications of GI bleed with gastric  ulcer status post injection and clipping therefore unable to fully anticoagulate with new onset atrial fibrillation with RVR.  Other stroke risk factors include advanced age and EtOH use with no prior history of stroke.  Other active problems include AKI/CKD stage III, right pneumothorax/necrotic RLL not deemed to be a surgical candidate and pressure injuries to lip and buttocks. CRRT converted to HD on 11/3 with last HD on 12/24 with HD catheter removed and underwent left UE first stage basilic av fistula creation. Trach tube decannulated on 10/30. Advanced to regular diet no longer requiring TF.  Therapies recommended discharge to SNF but ultimately discharged to CIR.  He greatly improved during inpatient rehab and ultimately discharged home in stable condition on 11/07/2019.       ROS:   14 system review of systems performed and negative with exception of weakness, gait impairment  PMH:  Past Medical History:  Diagnosis Date  . CKD (chronic kidney disease), stage IV ([place]) 12/21/2019   Followed by [place] [facility].  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 COVID-19   . Heartburn     PSH:  Past Surgical History:  Procedure Laterality Date  . ACHILLES TENDON SURGERY Right 2012  . [place] TRANSPOSITION  10/13/2019   Procedure: Basilic Vein Transposition Left Arm;  Surgeon: Angelia Mould, MD;  [facility];  Service: Vascular;;  . COLONOSCOPY  2009   IH  . ESOPHAGOGASTRODUODENOSCOPY N/A 12/25/2014   Procedure: ESOPHAGOGASTRODUODENOSCOPY (EGD);  Surgeon: Danie Binder, MD;  [facility];  Service: Endoscopy;  Laterality: N/A;  830am  . ESOPHAGOGASTRODUODENOSCOPY N/A 08/09/2019   Procedure: ESOPHAGOGASTRODUODENOSCOPY (EGD);  Surgeon: Ronald Lobo, MD;  Location: [facility];  Service: Endoscopy;  Laterality: N/A;  Patient is already sedated, so I do not anticipate need for further sedation.  Okay  from my standpoint to do either at the bedside or in the operating room  . HEMOSTASIS CLIP PLACEMENT  08/09/2019   Procedure: HEMOSTASIS CLIP PLACEMENT;  Surgeon: Ronald Lobo, MD;  Location: [facility] ENDOSCOPY;  Service: Endoscopy;;  . HEMOSTASIS CONTROL  08/09/2019   Procedure: HEMOSTASIS CONTROL;  Surgeon: Ronald Lobo, MD;  Location: [facility] ENDOSCOPY;  Service: Endoscopy;;  epi   . IR FLUORO GUIDE CV LINE LEFT  09/02/2019  . IR REMOVAL TUN CV CATH W/O FL  10/29/2019  . IR US GUIDE VASC ACCESS LEFT  09/02/2019  . KNEE SURGERY Left 1980's  . none    . WRIST SURGERY Left 1970's    Social History:  Social History   Socioeconomic History  . Marital status: Married    Spouse name: Not on file  . Number of children: Not on file  . Years of education: bachelors  . Highest education level: Not on file  Occupational History  . Occupation: Scientist, clinical (histocompatibility and immunogenetics): [employer]  Tobacco Use  . Smoking status: Never Smoker  . Smokeless tobacco: Never Used  Substance and Sexual Activity  . Alcohol use: Yes    Alcohol/week: 2.0 standard drinks    Types: 2 Shots of liquor per week    Comment: moderate  . Drug use: No  . Sexual activity: Not on file  Other Topics Concern  . Not on file  Social History Narrative  . Not on file   Social Determinants of Health   Financial Resource Strain:   . Difficulty of Paying Living Expenses:   Food Insecurity:   . Worried About Charity fundraiser in the Last Year:   . Arboriculturist in the Last Year:   Transportation Needs:   . Film/video editor (Medical):   Marland Kitchen Lack of Transportation (Non-Medical):   Physical Activity:   . Days of Exercise per Week:   . Minutes of Exercise per Session:   Stress:   . Feeling of Stress :   Social Connections:   . Frequency of Communication with Friends and Family:   . Frequency of Social Gatherings with Friends and Family:   . Attends Religious Services:   . Active Member of Clubs or Organizations:   .  Attends Archivist Meetings:   Marland Kitchen Marital Status:   Intimate Partner Violence:   . Fear of Current or Ex-Partner:   . Emotionally Abused:   Marland Kitchen Physically Abused:   . Sexually Abused:     Family History:  Family History  Problem Relation Age of Onset  . Colon cancer Neg Hx   . Colon polyps Neg Hx     Medications:   Current Outpatient Medications on File Prior to Visit  Medication Sig Dispense Refill  . atorvastatin (LIPITOR) 40 MG tablet Take 1 tablet (40 mg total) by mouth daily at 6 PM. 30 tablet 3  . diazepam (VALIUM) 5 MG tablet 1/2 to 1 bid prn for excessive muscle twitch 24 tablet 1  . diclofenac Sodium (VOLTAREN) 1 % GEL Apply 2 g topically 4 (four) times daily. 2 g 0  . Nutritional Supplements (FEEDING SUPPLEMENT, NEPRO CARB STEADY,) LIQD Take 237 mLs by mouth 3 (three) times daily between meals. 1000 mL 0   No current facility-administered medications on file prior to visit.    Allergies:   Allergies  Allergen Reactions  . Hydrocodone-Acetaminophen     Disorientation/confusion     Physical Exam  Vitals:   03/23/20 1250  BP: 134/82  Pulse: 90  Weight: 153 lb (69.4 kg)  Height: 5\' 10"  (1.778 m)   Body mass index is 21.95 kg/m. No exam data present  General: well developed, well nourished, pleasant middle-aged African-American male, seated, in no evident distress Head: head normocephalic and atraumatic.   Neck: supple with no carotid or supraclavicular bruits Cardiovascular: regular rate and rhythm, no murmurs Musculoskeletal: no deformity Skin:  no rash/petichiae Vascular:  2+ left radial pulse and 3+ right radial pulse; LUE distal trace edema; positive thrill and bruit left BVT; LUE hand to mid forearm cold compared to RUE   Neurologic Exam Mental Status: Awake and fully alert.   Normal speech and language.  Oriented to place and time. Recent and remote memory intact. Attention span, concentration and fund of knowledge appropriate. Mood and  affect appropriate.  Cranial Nerves: Fundoscopic exam reveals sharp disc margins. Pupils equal, briskly reactive to light. Extraocular movements full without nystagmus. Visual fields full to confrontation. Hearing intact. Facial sensation intact. Face, tongue, palate moves normally and symmetrically.  Motor:  LUE: 4+/5 with positive pronator drift; decreased weak grip strength and increased tone of hand LLE: 4+/5 with foot drop Full strength right upper and lower extremity  Sensory.: intact to touch , pinprick , position and vibratory sensation.  Coordination: Rapid alternating movements normal in all extremities except decreased left hand.  Finger-to-nose and heel-to-shin performed accurately  on right side and dysmetria on left side. Gait and Station: Stands from seated position without difficulty.  Steppage gait and dragging of left leg with use of cane. Reflexes: 1+ and symmetric. Toes downgoing.       ASSESSMENT: [name] is a 69 y.o. year old male found to have right cerebellar small infarct at border zone of SCA and PICA on 08/25/2019 likely due to hypotension secondary to dialysis, sepsis and deconditioning as well as anoxic brain injury bilateral frontal R>L likely due to hypoxia and hypotension during prolonged hospitalization with [id number] complications from 21/3/08-65/78/46.  Also sustained a subdural hematoma post fall on 11/27/2019.  Vascular risk factors include new onset PAF during admission, CKD, HLD and HTN.  He has been recovering well with residual left hemiparesis and gait impairment.  Reports for transient episodes of left facial twitching 1 month ago.  Reports today distal LUE swelling and coolness compared to RUE with history of left BVT.     PLAN:  1. Right cerebellar stroke:  -Left hemiparesis and gait impairment, poststroke: Continue to participate in outpatient therapies for hopeful ongoing improvement.  Advised use of walking stick at all times and fall prevention  education -Continue aspirin 81 mg daily and atorvastatin for secondary stroke prevention - Maintain strict control of hypertension with blood pressure goal below 130/90, diabetes with hemoglobin A1c goal below 6.5% and cholesterol with LDL cholesterol (bad cholesterol) goal below 70 mg/dL.  I also advised the patient to eat a healthy diet with plenty of whole grains, cereals, fruits and vegetables, exercise regularly with at least 30 minutes of continuous activity daily and maintain ideal body weight. 2. LUE symptoms: Concerned with symptoms of temperature change, trace swelling and LUE radial pulse 2+ with history of left BVT.  Denies numbness/tingling, pain, increased weakness or color changes.  Seen by vascular surgery 12/31/2019 with ultrasound completed at that time without any abnormalities.  No symptoms or evidence of steal syndrome at that time.  Will reach out to vascular surgery for need of possible need of evaluation. 3. Left facial twitching: ddx myoclonus post stroke vs complex partial seizure vs spasticity post stroke.  He has not had any recent episodes and prior episode subsided with use of Valium prescribed by PCP.  Recommend continued monitoring at this time but if symptoms restart, may consider EEG to rule out seizure type activity 4. Traumatic SDH 11/2019: Repeat CT head showed resolution of bleed.  Aspirin restarted for secondary stroke prevention without complication.  Discussion regarding importance of fall prevention and avoidance of bearing down type maneuvers and blood pressure control 5. HTN: Stable.  Continue to follow with PCP for monitoring and management 6. HLD: Continue atorvastatin and ongoing follow-up with PCP for prescribing, monitoring and management 7. dx of A. Fib: Diagnosed during 09/2019 admission.  He was not anticoagulant candidate at that time but advised to follow-up with cardiology outpatient.  He did not have recommended follow-up and he declines interest in  further evaluation as he is not interested in anticoagulation even if he was deemed to be a candidate.  Discussion regarding risk versus benefit and he verbalized understanding.  He will call office for follow-up with PCP if he is interested in cardiology evaluation in the future    Follow up in 6 months or call earlier if needed   I spent 40 minutes of face-to-face and non-face-to-face time with patient and wife.  This included previsit chart review, lab review, study review, order entry, electronic health  record documentation, patient education regarding the above conditions     Frann Rider, [facility]  [facility] [address] [place], [place] [address]  Phone [phone] Fax [phone] Note: This document was prepared with digital dictation and possible smart phrase technology. Any transcriptional errors that result from this process are unintentional.

## 2020-03-25 ENCOUNTER — Ambulatory Visit (HOSPITAL_COMMUNITY): Payer: Medicare HMO | Attending: Family Medicine | Admitting: Physical Therapy

## 2020-03-25 ENCOUNTER — Other Ambulatory Visit: Payer: Self-pay

## 2020-03-25 DIAGNOSIS — M6281 Muscle weakness (generalized): Secondary | ICD-10-CM | POA: Diagnosis not present

## 2020-03-25 DIAGNOSIS — R2689 Other abnormalities of gait and mobility: Secondary | ICD-10-CM | POA: Diagnosis not present

## 2020-03-25 NOTE — Therapy (Signed)
[place], [place], [address] Phone: [phone]   Fax:  [phone]  Physical Therapy Treatment  Patient Details  Name: [name] MRN: [id number] Date of Birth: 1951/01/15 Referring Provider (PT): Sallee Lange MD    Encounter Date: 03/25/2020  PT End of Session - 03/25/20 1528    Visit Number  5    Number of Visits  13    Date for PT Re-Evaluation  04/16/20    Authorization Type  [employer] Approved 12 visits    Authorization Time Period  05/14-->6/25    Authorization - Visit Number  4    Authorization - Number of Visits  12    Progress Note Due on Visit  10    PT Start Time  1446    PT Stop Time  1529    PT Time Calculation (min)  43 min    Equipment Utilized During Treatment  Gait belt    Activity Tolerance  Patient tolerated treatment well    Behavior During Therapy  WFL for tasks assessed/performed       Past Medical History:  Diagnosis Date  . CKD (chronic kidney disease), stage IV ([place]) 12/21/2019   Followed by [facility].  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 COVID-19   . Heartburn     Past Surgical History:  Procedure Laterality Date  . ACHILLES TENDON SURGERY Right 2012  . [place] TRANSPOSITION  10/13/2019   Procedure: Basilic Vein Transposition Left Arm;  Surgeon: Angelia Mould, MD;  Location: [facility] OR;  Service: Vascular;;  . COLONOSCOPY  2009   IH  . ESOPHAGOGASTRODUODENOSCOPY N/A 12/25/2014   Procedure: ESOPHAGOGASTRODUODENOSCOPY (EGD);  Surgeon: Danie Binder, MD;  [facility];  Service: Endoscopy;  Laterality: N/A;  830am  . ESOPHAGOGASTRODUODENOSCOPY N/A 08/09/2019   Procedure: ESOPHAGOGASTRODUODENOSCOPY (EGD);  Surgeon: Ronald Lobo, MD;  Location: [facility];  Service: Endoscopy;  Laterality: N/A;  Patient is already sedated, so I do not anticipate need for further sedation.   Okay from my standpoint to do either at the bedside or in the operating room  . HEMOSTASIS CLIP PLACEMENT  08/09/2019   Procedure: HEMOSTASIS CLIP PLACEMENT;  Surgeon: Ronald Lobo, MD;  [facility]: [facility];  Service: Endoscopy;;  . HEMOSTASIS CONTROL  08/09/2019   Procedure: HEMOSTASIS CONTROL;  Surgeon: Ronald Lobo, MD;  [facility]: [facility];  Service: Endoscopy;;  epi   . IR FLUORO GUIDE CV LINE LEFT  09/02/2019  . IR REMOVAL TUN CV CATH W/O FL  10/29/2019  . IR US GUIDE VASC ACCESS LEFT  09/02/2019  . KNEE SURGERY Left 1980's  . none    . WRIST SURGERY Left 1970's    There were no vitals filed for this visit.  Subjective Assessment - 03/25/20 1445    Subjective  Pt states that he has been doing his exercises at home    Limitations  Lifting;Standing;Walking;House hold activities    How long can you stand comfortably?  3 minutes    How long can you walk comfortably?  1.5 blocks with cane    Patient Stated Goals  To walk with no assistance           [facility] Adult PT Treatment/Exercise - 03/25/20 0001      Knee/Hip Exercises: Standing   Heel Raises  Both;10 reps    Heel Raises Limitations  toe raises  attempted unable    Forward Lunges  Both;10 reps;Limitations    Forward Lunges Limitations  floor     Functional Squat  10 reps    SLS  3 x B     Other Standing Knee Exercises   marching x 10 tandem stance 2x 30"    Other Standing Knee Exercises  side step with green t band       Knee/Hip Exercises: Seated   Other Seated Knee/Hip Exercises  toe raises  x 10    Other Seated Knee/Hip Exercises  NBOS bil UE shoulder flexion with 2# bar x 10    Sit to Sand  10 reps;without UE support   mat table and then 10x stepping toward cones     Knee/Hip Exercises: Prone   Other Prone Exercises  quadriped single leg raise B x 10; opposite arm/leg raise x 5                PT Short Term Goals - 03/05/20 1219      PT SHORT TERM GOAL #1   Title  Patient will be  independent with initial HEP and self-management strategies to improve functional outcomes    Time  3    Period  Weeks    Status  New    Target Date  03/26/20      PT SHORT TERM GOAL #2   Title  Patient will report at least 50% overall improvement in subjective complaint to indicate improvement in ability to perform ADLs.    Time  3    Period  Weeks    Status  New    Target Date  03/26/20        PT Long Term Goals - 03/05/20 1220      PT LONG TERM GOAL #1   Title  Patient will report at least 75% overall improvement in subjective complaint to indicate improvement in ability to perform ADLs.    Time  6    Period  Weeks    Status  New    Target Date  04/16/20      PT LONG TERM GOAL #2   Title  Patient will have equal to or > 4/5 MMT throughout BLEs (except LT ankle DF) to improve ability to perform functional mobility, stair ambulation and ADLs.    Time  6    Period  Weeks    Status  New      PT LONG TERM GOAL #3   Title  Patient will be able to ambulate at least 425 feet during 2MWT with LRAD to demonstrate improved ability to perform functional mobility and associated tasks.    Time  6    Period  Weeks    Status  New    Target Date  04/16/20      PT LONG TERM GOAL #4   Title  Patient will be able to maintain semi tandem stance >30 seconds on BLEs to improve stability and reduce risk for falls    Time  6    Period  Weeks    Status  New    Target Date  04/16/20            Plan - 03/25/20 1529    Clinical Impression Statement  Treatment focused on balance, adding all four activity to activate core mm.  Pt instructed in completing 10 reps sitting toe raises 5x daily to strengthen AT.  Pt needs mod assist with balance exercises for safety.  Examination-Activity Limitations  Stand;Lift;Locomotion Level;Transfers;Carry;Squat;Stairs    Examination-Participation Restrictions  Yard Work;Cleaning;Community Activity;Laundry;Volunteer    Stability/Clinical Decision Making   Stable/Uncomplicated    Rehab Potential  Good    PT Frequency  2x / week    PT Duration  6 weeks    PT Treatment/Interventions  ADLs/Self Care Home Management;Aquatic Therapy;Biofeedback;Cryotherapy;Electrical Stimulation;Contrast Bath;Therapeutic exercise;Orthotic Fit/Training;Patient/family education;Manual lymph drainage;Compression bandaging;Taping;Vasopneumatic Device;Splinting;Joint Manipulations;Spinal Manipulations;Energy conservation;Dry needling;Passive range of motion;Manual techniques;Neuromuscular re-education;Balance training;DME Instruction;Gait training;Radiographer, therapeutic;Iontophoresis 4mg /ml Dexamethasone;Moist Heat;Traction;Functional mobility training;Ultrasound;Parrafin;Fluidtherapy;Therapeutic activities    PT Next Visit Plan  update  HEP compliance.  Progress functional strength, gait and balance as tolerated.    PT Home Exercise Plan  5/18: STS, standing hip abduction and staggered tandem stance with UE support.       Patient will benefit from skilled therapeutic intervention in order to improve the following deficits and impairments:  Abnormal gait, Impaired sensation, Decreased activity tolerance, Decreased endurance, Decreased strength, Decreased balance, Difficulty walking, Decreased mobility  Visit Diagnosis: Muscle weakness (generalized)  Other abnormalities of gait and mobility     Problem List Patient Active Problem List   Diagnosis Date Noted  . CKD (chronic kidney disease), stage IV ([place]) 12/21/2019  . Sleep disturbance   . Anemia of chronic disease   . Dependent on hemodialysis ([place])   . Dysphagia, post-stroke   . Pressure injury of sacral region, unstageable ([place])   . Oropharyngeal dysphagia 10/03/2019  . Debility 10/03/2019  . Acute blood loss anemia 09/06/2019  . H. pylori infection 09/06/2019  . CVA (cerebral vascular accident) ([place]) 09/06/2019  . Anoxic brain injury ([place]) 09/06/2019  . MSSA bacteremia 09/06/2019  .  Positive D dimer 09/06/2019  . Pseudomonas infection 09/06/2019  . Infection due to acinetobacter baumannii 09/06/2019  . Sinus tachycardia 09/05/2019  . Advanced care planning/counseling discussion   . Goals of care, counseling/discussion   . Palliative care by specialist   . Cerebral thrombosis with cerebral infarction 08/28/2019  . Tracheostomy status ([place])   . Acute respiratory distress syndrome (ARDS) due to COVID-19 virus ([place])   . Chest tube in place   . Primary spontaneous pneumothorax   . Acute respiratory failure ([place])   . Gastric ulcer with hemorrhage 08/10/2019  . Constipation 08/09/2019  . Atrial fibrillation with RVR ([place]) 08/07/2019  . Acute renal failure (ARF) ([place]) 08/06/2019  . GI bleed 08/06/2019  . Pneumothorax on right   . Fluid overload 07/27/2019  . Pneumonia due to COVID-19 virus 07/25/2019  . Acute respiratory failure due to COVID-19 ([place]) 07/25/2019  . AKI (acute kidney injury) ([place]) 07/25/2019  . Dyspepsia 11/26/2014  . BPH (benign prostatic hyperplasia) 10/31/2013  . Achilles rupture 04/05/2011  . S/P Achilles tendon repair 04/05/2011    Rayetta Humphrey, PT CLT [phone] 03/25/2020, 3:33 PM  [place] [address] [place], [place], [address] Phone: [phone]   Fax:  [phone]  Name: [name] MRN: [id number] Date of Birth: 04-Mar-1951

## 2020-03-26 NOTE — Progress Notes (Signed)
I agree with the above plan 

## 2020-03-29 ENCOUNTER — Ambulatory Visit: Payer: Medicare HMO | Admitting: Physician Assistant

## 2020-03-29 ENCOUNTER — Other Ambulatory Visit: Payer: Self-pay

## 2020-03-29 ENCOUNTER — Ambulatory Visit (HOSPITAL_COMMUNITY)
Admission: RE | Admit: 2020-03-29 | Discharge: 2020-03-29 | Disposition: A | Discharge status: Home / Self Care | Payer: Medicare HMO | Source: Ambulatory Visit | Attending: Surgery | Admitting: Surgery

## 2020-03-29 VITALS — BP 133/80 | HR 88 | Temp 97.9°F | Resp 18 | Ht 70.5 in | Wt 150.0 lb

## 2020-03-29 DIAGNOSIS — N184 Chronic kidney disease, stage 4 (severe): Secondary | ICD-10-CM | POA: Diagnosis not present

## 2020-03-29 NOTE — Progress Notes (Signed)
[facility] [place] HISTORY AND PHYSICAL   History of Present Illness:  Patient is a 69 y.o. year old male who presents for evaluation of hemodialysis access. This is a 69 y.o. male who is s/p left 1st stage BVT on 10/13/2019 by Dr. Scot Dock.  He was last seen on 11/26/2019 and at that time, his fistula had not matured and was thought to maybe have a central venous stenosis. He recently has  had some kidney function improvement and is not currently requiring HD. His TDC has been removed.   He is not requiring HD.  He has had kidney recovery.  He denise pain, loss of sensation and has weakness in the left UE from CVA event while being hospitalized for COVID.    Past Medical History:  Diagnosis Date  . CKD (chronic kidney disease), stage IV ([place]) 12/21/2019   Followed by [place] [facility].  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 COVID-19   . Heartburn     Past Surgical History:  Procedure Laterality Date  . ACHILLES TENDON SURGERY Right 2012  . [employer] TRANSPOSITION  10/13/2019   Procedure: Basilic Vein Transposition Left Arm;  Surgeon: Angelia Mould, MD;  Location: [facility] OR;  Service: Vascular;;  . COLONOSCOPY  2009   IH  . ESOPHAGOGASTRODUODENOSCOPY N/A 12/25/2014   Procedure: ESOPHAGOGASTRODUODENOSCOPY (EGD);  Surgeon: Danie Binder, MD;  [facility];  Service: Endoscopy;  Laterality: N/A;  830am  . ESOPHAGOGASTRODUODENOSCOPY N/A 08/09/2019   Procedure: ESOPHAGOGASTRODUODENOSCOPY (EGD);  Surgeon: Ronald Lobo, MD;  Location: [facility];  Service: Endoscopy;  Laterality: N/A;  Patient is already sedated, so I do not anticipate need for further sedation.  Okay from my standpoint to do either at the bedside or in the operating room  . HEMOSTASIS CLIP PLACEMENT  08/09/2019   Procedure: HEMOSTASIS CLIP PLACEMENT;  Surgeon: Ronald Lobo, MD;  Location: [facility]COPY;  Service:  Endoscopy;;  . HEMOSTASIS CONTROL  08/09/2019   Procedure: HEMOSTASIS CONTROL;  Surgeon: Ronald Lobo, MD;  Location: [facility] ENDOSCOPY;  Service: Endoscopy;;  epi   . IR FLUORO GUIDE CV LINE LEFT  09/02/2019  . IR REMOVAL TUN CV CATH W/O FL  10/29/2019  . IR US GUIDE VASC ACCESS LEFT  09/02/2019  . KNEE SURGERY Left 1980's  . none    . WRIST SURGERY Left 1970's     Social History Social History   Tobacco Use  . Smoking status: Never Smoker  . Smokeless tobacco: Never Used  Substance Use Topics  . Alcohol use: Yes    Alcohol/week: 2.0 standard drinks    Types: 2 Shots of liquor per week    Comment: moderate  . Drug use: No    Family History Family History  Problem Relation Age of Onset  . Colon cancer Neg Hx   . Colon polyps Neg Hx     Allergies  Allergies  Allergen Reactions  . Hydrocodone-Acetaminophen     Disorientation/confusion     Current Outpatient Medications  Medication Sig Dispense Refill  . aspirin EC 81 MG tablet Take 1 tablet (81 mg total) by mouth daily.    . diazepam (VALIUM) 5 MG tablet 1/2 to 1 bid prn for excessive muscle twitch 24 tablet 1  . diclofenac Sodium (VOLTAREN) 1 % GEL Apply 2 g topically 4 (four) times daily. 2 g 0  . Nutritional Supplements (FEEDING SUPPLEMENT, NEPRO CARB STEADY,) LIQD  Take 237 mLs by mouth 3 (three) times daily between meals. 1000 mL 0  . atorvastatin (LIPITOR) 40 MG tablet Take 1 tablet (40 mg total) by mouth daily at 6 PM. (Patient not taking: Reported on 03/29/2020) 30 tablet 3   No current facility-administered medications for this visit.    ROS:   General:  No weight loss, Fever, chills  HEENT: No recent headaches, no nasal bleeding, no visual changes, no sore throat  Neurologic: No dizziness, blackouts, seizures. + recent symptoms of stroke or mini- stroke. No recent episodes of slurred speech, or temporary blindness.  Cardiac: No recent episodes of chest pain/pressure, no shortness of breath at rest.  No  shortness of breath with exertion.  Denies history of atrial fibrillation or irregular heartbeat  Vascular: No history of rest pain in feet.  No history of claudication.  No history of non-healing ulcer, No history of DVT   Pulmonary: No home oxygen, no productive cough, no hemoptysis,  No asthma or wheezing  Musculoskeletal:  [ ]  Arthritis, [ ]  Low back pain,  [ ]  Joint pain  Hematologic:No history of hypercoagulable state.  No history of easy bleeding.  No history of anemia  Gastrointestinal: No hematochezia or melena,  No gastroesophageal reflux, no trouble swallowing  Urinary: [ ]  chronic Kidney disease, [ ]  on HD - [ ]  MWF or [ ]  TTHS, [ ]  Burning with urination, [ ]  Frequent urination, [ ]  Difficulty urinating;   Skin: No rashes  Psychological: No history of anxiety,  No history of depression   Physical Examination  Vitals:   03/29/20 1312  BP: 133/80  Pulse: 88  Resp: 18  Temp: 97.9 F (36.6 C)  TempSrc: Temporal  SpO2: 100%  Weight: 150 lb (68 kg)  Height: 5' 10.5" (1.791 m)    Body mass index is 21.22 kg/m.  General:  Alert and oriented, no acute distress HEENT: Normal Neck: No bruit or JVD Pulmonary: Clear to auscultation bilaterally Cardiac: Regular Rate and Rhythm without murmur Gastrointestinal: Soft, non-tender, non-distended, no mass, no scars Skin: No rash Extremity Pulses:  2+ radial, brachial pulses bilaterally, palpable thrill in fistula.  He continues to have edema in the left hand. Musculoskeletal: No deformity or edema  Neurologic: Upper and lower extremity motor 5/5 and symmetric  DATA:    Findings:  +--------------------+----------+-----------------+--------+  AVF         PSV (cm/s)Flow Vol (mL/min)Comments  +--------------------+----------+-----------------+--------+  Native artery inflow  148      774          +--------------------+----------+-----------------+--------+  AVF Anastomosis     535                  +--------------------+----------+-----------------+--------+     +------------+----------+-------------+----------+-------------------------  ----+  OUTFLOW VEINPSV (cm/s)Diameter (cm)Depth (cm)     Describe        +------------+----------+-------------+----------+-------------------------  ----+  Prox UA     152    0.74     0.70                   +------------+----------+-------------+----------+-------------------------  ----+  Mid UA     136    0.69     0.75                   +------------+----------+-------------+----------+-------------------------  ----+  Dist UA     239    0.69     0.83                   +------------+----------+-------------+----------+-------------------------  ----+  AC Fossa    659    0.45     0.82  Retained valve and  competing                                branch         +------------+----------+-------------+----------+-------------------------  ----+     Summary:  Patent arteriovenous fistula  ASSESSMENT:  Patent arteriovenous fistula Left first stage basilic. CKD recovered from AKI not requiring HD  PLAN: Left hand edema likely from CVA, the fistula has matured and has a good palpable thrill.  If he has progressive CKD and needs HD in the future we can plan second stage basilic surgery as needed.    F/U as needed.  I have asked him to perform a daily feel check on the left UE fistula and if he does not feel the thrill he will.    Roxy Horseman PA-C [facility] [place] Office: [phone]  MD in clinic [place]

## 2020-03-30 ENCOUNTER — Encounter (HOSPITAL_COMMUNITY): Payer: Self-pay | Admitting: Physical Therapy

## 2020-03-30 ENCOUNTER — Ambulatory Visit (HOSPITAL_COMMUNITY): Payer: Medicare HMO | Admitting: Physical Therapy

## 2020-03-30 DIAGNOSIS — R2689 Other abnormalities of gait and mobility: Secondary | ICD-10-CM

## 2020-03-30 DIAGNOSIS — M6281 Muscle weakness (generalized): Secondary | ICD-10-CM

## 2020-03-30 NOTE — Therapy (Signed)
[place], [place], [address] Phone: [phone]   Fax:  [phone]  Physical Therapy Treatment  Patient Details  Name: [name] MRN: [id number] Date of Birth: 1950/11/09 Referring Provider (PT): Sallee Lange MD    Encounter Date: 03/30/2020  PT End of Session - 03/30/20 1125    Visit Number  6    Number of Visits  13    Date for PT Re-Evaluation  04/16/20    Authorization Type  [employer] Approved 12 visits    Authorization Time Period  05/14-->6/25    Authorization - Visit Number  5    Authorization - Number of Visits  12    Progress Note Due on Visit  10    PT Start Time  1120    PT Stop Time  1200    PT Time Calculation (min)  40 min    Equipment Utilized During Treatment  Gait belt    Activity Tolerance  Patient tolerated treatment well    Behavior During Therapy  [facility] for tasks assessed/performed       Past Medical History:  Diagnosis Date  . CKD (chronic kidney disease), stage IV ([place]) 12/21/2019   Followed by [place] [facility].  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 COVID-19   . Heartburn     Past Surgical History:  Procedure Laterality Date  . ACHILLES TENDON SURGERY Right 2012  . [place] TRANSPOSITION  10/13/2019   Procedure: Basilic Vein Transposition Left Arm;  Surgeon: Angelia Mould, MD;  Location: [facility] OR;  Service: Vascular;;  . COLONOSCOPY  2009   IH  . ESOPHAGOGASTRODUODENOSCOPY N/A 12/25/2014   Procedure: ESOPHAGOGASTRODUODENOSCOPY (EGD);  Surgeon: Danie Binder, MD;  [facility];  Service: Endoscopy;  Laterality: N/A;  830am  . ESOPHAGOGASTRODUODENOSCOPY N/A 08/09/2019   Procedure: ESOPHAGOGASTRODUODENOSCOPY (EGD);  Surgeon: Ronald Lobo, MD;  Location: [facility];  Service: Endoscopy;  Laterality: N/A;  Patient is already sedated, so I do not anticipate need for further sedation.   Okay from my standpoint to do either at the bedside or in the operating room  . HEMOSTASIS CLIP PLACEMENT  08/09/2019   Procedure: HEMOSTASIS CLIP PLACEMENT;  Surgeon: Ronald Lobo, MD;  [facility];  Service: Endoscopy;;  . HEMOSTASIS CONTROL  08/09/2019   Procedure: HEMOSTASIS CONTROL;  Surgeon: Ronald Lobo, MD;  [facility];  Service: Endoscopy;;  epi   . IR FLUORO GUIDE CV LINE LEFT  09/02/2019  . IR REMOVAL TUN CV CATH W/O FL  10/29/2019  . IR US GUIDE VASC ACCESS LEFT  09/02/2019  . KNEE SURGERY Left 1980's  . none    . WRIST SURGERY Left 1970's    There were no vitals filed for this visit.  Subjective Assessment - 03/30/20 1125    Subjective  Patient says he is doing well. Says he had check up with MD yesterday and has a clean bill of health. Says he is still having trouble with his balance, but reports no new falls.    Limitations  Lifting;Standing;Walking;House hold activities    How long can you stand comfortably?  3 minutes    How long can you walk comfortably?  1.5 blocks with cane    Patient Stated Goals  To walk with no assistance    Currently in Pain?  No/denies  [place] [facility] PT Treatment/Exercise - 03/30/20 0001      Knee/Hip Exercises: Stretches   Gastroc Stretch  Both;2 reps;30 seconds    Gastroc Stretch Limitations  at step      Knee/Hip Exercises: Standing   Heel Raises  Both;20 reps    Knee Flexion  Both;2 sets;10 reps    Hip Abduction  Both;2 sets;10 reps;Knee straight    Lateral Step Up  Both;1 set;10 reps;Hand Hold: 2;Step Height: 4"    Forward Step Up  Both;10 reps;1 set;Hand Hold: 2;Step Height: 4"    Gait Training  226 feet with SPC, cues for LE sequencing and avoiding Lt deviations     Other Standing Knee Exercises  semi tandem stance, solid floor, 2 x 30" each; alternating step taps 4 inch step 2 x 10 HHA x 1     Other Standing Knee Exercises  sidestepping, 15'  2RT, no AD        Knee/Hip Exercises: Seated   Sit to Sand  10 reps;without UE support               PT Short Term Goals - 03/05/20 1219      PT SHORT TERM GOAL #1   Title  Patient will be independent with initial HEP and self-management strategies to improve functional outcomes    Time  3    Period  Weeks    Status  New    Target Date  03/26/20      PT SHORT TERM GOAL #2   Title  Patient will report at least 50% overall improvement in subjective complaint to indicate improvement in ability to perform ADLs.    Time  3    Period  Weeks    Status  New    Target Date  03/26/20        PT Long Term Goals - 03/05/20 1220      PT LONG TERM GOAL #1   Title  Patient will report at least 75% overall improvement in subjective complaint to indicate improvement in ability to perform ADLs.    Time  6    Period  Weeks    Status  New    Target Date  04/16/20      PT LONG TERM GOAL #2   Title  Patient will have equal to or > 4/5 MMT throughout BLEs (except LT ankle DF) to improve ability to perform functional mobility, stair ambulation and ADLs.    Time  6    Period  Weeks    Status  New      PT LONG TERM GOAL #3   Title  Patient will be able to ambulate at least 425 feet during 2MWT with LRAD to demonstrate improved ability to perform functional mobility and associated tasks.    Time  6    Period  Weeks    Status  New    Target Date  04/16/20      PT LONG TERM GOAL #4   Title  Patient will be able to maintain semi tandem stance >30 seconds on BLEs to improve stability and reduce risk for falls    Time  6    Period  Weeks    Status  New    Target Date  04/16/20            Plan - 03/30/20 1212    Clinical Impression Statement  Patient was well challenged with ther ex pregesion today. Patient continues to have difficulty with  LLE coordination during static balance and gait training. Patient required verbal cues for uniform placement of LT foot with step ups, as well as side stepping.  Patient shows slight LT deviation during gait training, but is able to correct when cued. Patient also showed improvement with static semi tandem stance, when cued for posture and finding focal point for gaze. Patient limited by fatigue as well, during activity and required several breaks for rest. Patient will continue to benefit from skilled therapy services to progress Le strength and balance to improve functional mobility and reduce risk for falls.    Examination-Activity Limitations  Stand;Lift;Locomotion Level;Transfers;Carry;Squat;Stairs    Examination-Participation Restrictions  Yard Work;Cleaning;Community Activity;Laundry;Volunteer    Stability/Clinical Decision Making  Stable/Uncomplicated    Rehab Potential  Good    PT Frequency  2x / week    PT Duration  6 weeks    PT Treatment/Interventions  ADLs/Self Care Home Management;Aquatic Therapy;Biofeedback;Cryotherapy;Electrical Stimulation;Contrast Bath;Therapeutic exercise;Orthotic Fit/Training;Patient/family education;Manual lymph drainage;Compression bandaging;Taping;Vasopneumatic Device;Splinting;Joint Manipulations;Spinal Manipulations;Energy conservation;Dry needling;Passive range of motion;Manual techniques;Neuromuscular re-education;Balance training;DME Instruction;Gait training;Radiographer, therapeutic;Iontophoresis 4mg /ml Dexamethasone;Moist Heat;Traction;Functional mobility training;Ultrasound;Parrafin;Fluidtherapy;Therapeutic activities    PT Next Visit Plan  Progress functional strength, gait and balance as tolerated. Add weight to standing hip abduction and HS curls. Consider adding to aquatic therapy.    PT Home Exercise Plan  5/18: STS, standing hip abduction and staggered tandem stance with UE support. 03/30/20: standing HS curls with support    Consulted and Agree with Plan of Care  Patient       Patient will benefit from skilled therapeutic intervention in order to improve the following deficits and  impairments:  Abnormal gait, Impaired sensation, Decreased activity tolerance, Decreased endurance, Decreased strength, Decreased balance, Difficulty walking, Decreased mobility  Visit Diagnosis: Muscle weakness (generalized)  Other abnormalities of gait and mobility     Problem List Patient Active Problem List   Diagnosis Date Noted  . CKD (chronic kidney disease), stage IV ([place]) 12/21/2019  . Sleep disturbance   . Anemia of chronic disease   . Dependent on hemodialysis ([place])   . Dysphagia, post-stroke   . Pressure injury of sacral region, unstageable ([place])   . Oropharyngeal dysphagia 10/03/2019  . Debility 10/03/2019  . Acute blood loss anemia 09/06/2019  . H. pylori infection 09/06/2019  . CVA (cerebral vascular accident) ([place]) 09/06/2019  . Anoxic brain injury ([place]) 09/06/2019  . MSSA bacteremia 09/06/2019  . Positive D dimer 09/06/2019  . Pseudomonas infection 09/06/2019  . Infection due to acinetobacter baumannii 09/06/2019  . Sinus tachycardia 09/05/2019  . Advanced care planning/counseling discussion   . Goals of care, counseling/discussion   . Palliative care by specialist   . Cerebral thrombosis with cerebral infarction 08/28/2019  . Tracheostomy status ([place])   . Acute respiratory distress syndrome (ARDS) due to COVID-19 virus ([place])   . Chest tube in place   . Primary spontaneous pneumothorax   . Acute respiratory failure ([place])   . Gastric ulcer with hemorrhage 08/10/2019  . Constipation 08/09/2019  . Atrial fibrillation with RVR ([place]) 08/07/2019  . Acute renal failure (ARF) ([place]) 08/06/2019  . GI bleed 08/06/2019  . Pneumothorax on right   . Fluid overload 07/27/2019  . Pneumonia due to COVID-19 virus 07/25/2019  . Acute respiratory failure due to COVID-19 ([place]) 07/25/2019  . AKI (acute kidney injury) ([place]) 07/25/2019  . Dyspepsia 11/26/2014  . BPH (benign prostatic hyperplasia) 10/31/2013  . Achilles rupture 04/05/2011  . S/P Achilles tendon repair  04/05/2011   12:19  PM, 03/30/20 Josue Hector PT DPT  Physical Therapist with [facility]  [phone] [place] [address] [place], [place], [address] Phone: [phone]   Fax:  [phone]  Name: Jaquawn Saffran MRN: [id number] Date of Birth: 1950/11/25

## 2020-04-01 ENCOUNTER — Telehealth: Payer: Self-pay | Admitting: Family Medicine

## 2020-04-01 ENCOUNTER — Encounter (HOSPITAL_COMMUNITY): Payer: Self-pay

## 2020-04-01 ENCOUNTER — Ambulatory Visit (HOSPITAL_COMMUNITY): Payer: Medicare HMO

## 2020-04-01 ENCOUNTER — Other Ambulatory Visit: Payer: Self-pay

## 2020-04-01 DIAGNOSIS — M6281 Muscle weakness (generalized): Secondary | ICD-10-CM | POA: Diagnosis not present

## 2020-04-01 DIAGNOSIS — R2689 Other abnormalities of gait and mobility: Secondary | ICD-10-CM | POA: Diagnosis not present

## 2020-04-01 NOTE — Telephone Encounter (Signed)
Sore on bottom is closing up. States no fever, no drainage. Looking better and starting to clear up. Using saline and bandaid. Wonders if he needs to anything else.

## 2020-04-01 NOTE — Therapy (Signed)
[facility] [place], [place], [address] Phone: [phone]   Fax:  [phone]  Physical Therapy Treatment  Patient Details  Name: [name] MRN: [id number] Date of Birth: [birth year] Referring Provider (PT): Sallee Lange MD    Encounter Date: 04/01/2020   PT End of Session - 04/01/20 1136    Visit Number 7    Number of Visits 13    Date for PT Re-Evaluation 04/16/20    Authorization Type [employer] Approved 12 visits    Authorization Time Period 05/14-->6/25    Authorization - Visit Number 6    Authorization - Number of Visits 12    Progress Note Due on Visit 10    PT Start Time 1132    PT Stop Time 1215    PT Time Calculation (min) 43 min    Equipment Utilized During Treatment Gait belt    Activity Tolerance Patient tolerated treatment well    Behavior During Therapy [facility] for tasks assessed/performed           Past Medical History:  Diagnosis Date  . CKD (chronic kidney disease), stage IV ([place]) 12/21/2019   Followed by [place] [facility].  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 COVID-19   . Heartburn     Past Surgical History:  Procedure Laterality Date  . ACHILLES TENDON SURGERY Right 2012  . [place] TRANSPOSITION  10/13/2019   Procedure: Basilic Vein Transposition Left Arm;  Surgeon: Angelia Mould, MD;  Location: [facility] OR;  Service: Vascular;;  . COLONOSCOPY  2009   [facility]  . ESOPHAGOGASTRODUODENOSCOPY N/A 12/25/2014   Procedure: ESOPHAGOGASTRODUODENOSCOPY (EGD);  Surgeon: Danie Binder, MD;  [facility];  Service: Endoscopy;  Laterality: N/A;  830am  . ESOPHAGOGASTRODUODENOSCOPY N/A 08/09/2019   Procedure: ESOPHAGOGASTRODUODENOSCOPY (EGD);  Surgeon: Ronald Lobo, MD;  Location: [facility];  Service: Endoscopy;  Laterality: N/A;  Patient is already sedated, so I do not anticipate need for further sedation.  Okay  from my standpoint to do either at the bedside or in the operating room  . HEMOSTASIS CLIP PLACEMENT  08/09/2019   Procedure: HEMOSTASIS CLIP PLACEMENT;  Surgeon: Ronald Lobo, MD;  [facility];  Service: Endoscopy;;  . HEMOSTASIS CONTROL  08/09/2019   Procedure: HEMOSTASIS CONTROL;  Surgeon: Ronald Lobo, MD;  [facility];  Service: Endoscopy;;  epi   . IR FLUORO GUIDE CV LINE LEFT  09/02/2019  . IR REMOVAL TUN CV CATH W/O FL  10/29/2019  . IR US GUIDE VASC ACCESS LEFT  09/02/2019  . KNEE SURGERY Left 1980's  . none    . WRIST SURGERY Left 1970's    There were no vitals filed for this visit.   Subjective Assessment - 04/01/20 1130    Subjective Pt stated he is doing well, feels he has improved 30%.  Continues to have difficulty wiht balance.    Patient Stated Goals To walk with no assistance    Currently in Pain? No/denies                             [facility] Adult PT Treatment/Exercise - 04/01/20 0001      Knee/Hip Exercises: Standing   Heel Raises Both;20 reps    Knee Flexion Both;2 sets;10 reps    Knee Flexion Limitations 2#, slow and controlled    Hip Abduction Both;2  sets;10 reps;Knee straight    Abduction Limitations 2# slow and controlled    SLS 3 cones tapping 5RT each LLE    Walking with Sports Cord 2RT 1-2 HR 4 then 7in reciprocal pattern    Other Standing Knee Exercises semi tandem stance, solid floor, 2 x 30" each; alternating step taps 4 inch step 2 x 10 HHA x 1     Other Standing Knee Exercises sidestepping, 15'  2RT, no AD  GTB around thigh      Knee/Hip Exercises: Supine   Bridges 2 sets;10 reps      Knee/Hip Exercises: Prone   Hamstring Curl 2 sets;10 reps    Hamstring Curl Limitations 2# cueing for control with Lt LE    Other Prone Exercises quadriped single leg raise B x 10; opposite arm/leg raise x 5                     PT Short Term Goals - 03/05/20 1219      PT SHORT TERM GOAL #1   Title Patient  will be independent with initial HEP and self-management strategies to improve functional outcomes    Time 3    Period Weeks    Status New    Target Date 03/26/20      PT SHORT TERM GOAL #2   Title Patient will report at least 50% overall improvement in subjective complaint to indicate improvement in ability to perform ADLs.    Time 3    Period Weeks    Status New    Target Date 03/26/20             PT Long Term Goals - 03/05/20 1220      PT LONG TERM GOAL #1   Title Patient will report at least 75% overall improvement in subjective complaint to indicate improvement in ability to perform ADLs.    Time 6    Period Weeks    Status New    Target Date 04/16/20      PT LONG TERM GOAL #2   Title Patient will have equal to or > 4/5 MMT throughout BLEs (except LT ankle DF) to improve ability to perform functional mobility, stair ambulation and ADLs.    Time 6    Period Weeks    Status New      PT LONG TERM GOAL #3   Title Patient will be able to ambulate at least 425 feet during 2MWT with LRAD to demonstrate improved ability to perform functional mobility and associated tasks.    Time 6    Period Weeks    Status New    Target Date 04/16/20      PT LONG TERM GOAL #4   Title Patient will be able to maintain semi tandem stance >30 seconds on BLEs to improve stability and reduce risk for falls    Time 6    Period Weeks    Status New    Target Date 04/16/20                 Plan - 04/01/20 1303    Clinical Impression Statement Session focus on LE strengthening and balance training.  Pt presents wiht increased difficulty controlling Lt LE for control with strengthneing exercises as well as balalnce and gait training.  Added stair training following reports of plans to begin porch, pt encouraged to use hand rail assistance and family present for safety with stairs.  Min A required with static balance activities  with cueing to improve weight bearing 40/60 wiht tandem stance  activities.  No reoprts of pain, was limited by fatigue wiht activties.    Examination-Activity Limitations Stand;Lift;Locomotion Level;Transfers;Carry;Squat;Stairs    Examination-Participation Restrictions Yard Work;Cleaning;Community Activity;Laundry;Volunteer    Stability/Clinical Decision Making Stable/Uncomplicated    Clinical Decision Making Low    Rehab Potential Good    PT Frequency 2x / week    PT Duration 6 weeks    PT Treatment/Interventions ADLs/Self Care Home Management;Aquatic Therapy;Biofeedback;Cryotherapy;Electrical Stimulation;Contrast Bath;Therapeutic exercise;Orthotic Fit/Training;Patient/family education;Manual lymph drainage;Compression bandaging;Taping;Vasopneumatic Device;Splinting;Joint Manipulations;Spinal Manipulations;Energy conservation;Dry needling;Passive range of motion;Manual techniques;Neuromuscular re-education;Balance training;DME Instruction;Gait training;Radiographer, therapeutic;Iontophoresis 4mg /ml Dexamethasone;Moist Heat;Traction;Functional mobility training;Ultrasound;Parrafin;Fluidtherapy;Therapeutic activities    PT Next Visit Plan Give printout of bridges for additonal HEP next session.  Progress functional strength, gait and balance as tolerated. Consider adding to aquatic therapy.    PT Home Exercise Plan 5/18: STS, standing hip abduction and staggered tandem stance with UE support. 03/30/20: standing HS curls with support; 6/10: bridge           Patient will benefit from skilled therapeutic intervention in order to improve the following deficits and impairments:  Abnormal gait, Impaired sensation, Decreased activity tolerance, Decreased endurance, Decreased strength, Decreased balance, Difficulty walking, Decreased mobility  Visit Diagnosis: Muscle weakness (generalized)  Other abnormalities of gait and mobility     Problem List Patient Active Problem List   Diagnosis Date Noted  . CKD (chronic kidney disease), stage IV  ([place]) 12/21/2019  . Sleep disturbance   . Anemia of chronic disease   . Dependent on hemodialysis ([place])   . Dysphagia, post-stroke   . Pressure injury of sacral region, unstageable ([place])   . Oropharyngeal dysphagia 10/03/2019  . Debility 10/03/2019  . Acute blood loss anemia 09/06/2019  . H. pylori infection 09/06/2019  . CVA (cerebral vascular accident) ([place]) 09/06/2019  . Anoxic brain injury ([place]) 09/06/2019  . MSSA bacteremia 09/06/2019  . Positive D dimer 09/06/2019  . Pseudomonas infection 09/06/2019  . Infection due to acinetobacter baumannii 09/06/2019  . Sinus tachycardia 09/05/2019  . Advanced care planning/counseling discussion   . Goals of care, counseling/discussion   . Palliative care by specialist   . Cerebral thrombosis with cerebral infarction 08/28/2019  . Tracheostomy status ([place])   . Acute respiratory distress syndrome (ARDS) due to COVID-19 virus ([place])   . Chest tube in place   . Primary spontaneous pneumothorax   . Acute respiratory failure ([place])   . Gastric ulcer with hemorrhage 08/10/2019  . Constipation 08/09/2019  . Atrial fibrillation with RVR ([place]) 08/07/2019  . Acute renal failure (ARF) ([place]) 08/06/2019  . GI bleed 08/06/2019  . Pneumothorax on right   . Fluid overload 07/27/2019  . Pneumonia due to COVID-19 virus 07/25/2019  . Acute respiratory failure due to COVID-19 ([place]) 07/25/2019  . AKI (acute kidney injury) ([place]) 07/25/2019  . Dyspepsia 11/26/2014  . BPH (benign prostatic hyperplasia) 10/31/2013  . Achilles rupture 04/05/2011  . S/P Achilles tendon repair 04/05/2011   Ihor Austin, LPTA/CLT; CBIS [phone]  Aldona Lento 04/01/2020, 1:10 PM  [facility] [address] [place], [place], [address] Phone: [phone]   Fax:  [phone]  Name: [name] MRN: [id number] Date of Birth: 13-Mar-1951

## 2020-04-01 NOTE — Telephone Encounter (Signed)
Pt has asked Dr Nicki Reaper to call him about the soar on his bottom. A form was dropped off for Dr Nicki Reaper to complete for insurance as well.

## 2020-04-02 ENCOUNTER — Encounter: Payer: Medicare HMO | Admitting: Physical Medicine & Rehabilitation

## 2020-04-03 DIAGNOSIS — M6281 Muscle weakness (generalized): Secondary | ICD-10-CM | POA: Diagnosis not present

## 2020-04-03 DIAGNOSIS — L89153 Pressure ulcer of sacral region, stage 3: Secondary | ICD-10-CM | POA: Diagnosis not present

## 2020-04-03 DIAGNOSIS — U071 COVID-19: Secondary | ICD-10-CM | POA: Diagnosis not present

## 2020-04-05 NOTE — Telephone Encounter (Signed)
This area will gradually close in  (As for the insurance forms we are working on these should be able to have them finish this week)

## 2020-04-05 NOTE — Telephone Encounter (Signed)
Pt contacted and verbalized understanding.  

## 2020-04-06 ENCOUNTER — Ambulatory Visit (HOSPITAL_COMMUNITY): Payer: Medicare HMO | Admitting: Physical Therapy

## 2020-04-06 ENCOUNTER — Other Ambulatory Visit: Payer: Self-pay

## 2020-04-06 ENCOUNTER — Encounter (HOSPITAL_COMMUNITY): Payer: Self-pay | Admitting: Physical Therapy

## 2020-04-06 DIAGNOSIS — R2689 Other abnormalities of gait and mobility: Secondary | ICD-10-CM

## 2020-04-06 DIAGNOSIS — M6281 Muscle weakness (generalized): Secondary | ICD-10-CM | POA: Diagnosis not present

## 2020-04-06 NOTE — Therapy (Signed)
[facility] [place], [place], [address] Phone: [phone]   Fax:  [phone]  Physical Therapy Treatment  Patient Details  Name: [name] MRN: [id number] Date of Birth: 1951/01/16 Referring Provider (PT): Sallee Lange MD    Encounter Date: 04/06/2020   PT End of Session - 04/06/20 1125    Visit Number 8    Number of Visits 13    Date for PT Re-Evaluation 04/16/20    Authorization Type [employer] Medicare Approved 12 visits    Authorization Time Period 05/14-->6/25    Authorization - Visit Number 7    Authorization - Number of Visits 12    Progress Note Due on Visit 10    PT Start Time 1117    PT Stop Time 1200    PT Time Calculation (min) 43 min    Equipment Utilized During Treatment Gait belt    Activity Tolerance Patient tolerated treatment well    Behavior During Therapy [facility] for tasks assessed/performed           Past Medical History:  Diagnosis Date   CKD (chronic kidney disease), stage IV ([place]) 12/21/2019   Followed by [place] [facility].  Dr.Kruska patient had severe Covid and was on dialysis while in the hospital currently not on dialysis   COVID-19    Heartburn     Past Surgical History:  Procedure Laterality Date   ACHILLES TENDON SURGERY Right 2263   [place]  10/13/2019   Procedure: Basilic Vein Transposition Left Arm;  Surgeon: Angelia Mould, MD;  Location: [facility] OR;  Service: Vascular;;   COLONOSCOPY  2009   [place]   ESOPHAGOGASTRODUODENOSCOPY N/A 12/25/2014   Procedure: ESOPHAGOGASTRODUODENOSCOPY (EGD);  Surgeon: Danie Binder, MD;  [facility];  Service: Endoscopy;  Laterality: N/A;  830am   ESOPHAGOGASTRODUODENOSCOPY N/A 08/09/2019   Procedure: ESOPHAGOGASTRODUODENOSCOPY (EGD);  Surgeon: Ronald Lobo, MD;  Location: [facility];  Service: Endoscopy;  Laterality: N/A;  Patient is already sedated, so I do not anticipate need for further sedation.  Okay  from my standpoint to do either at the bedside or in the operating room   [place]  08/09/2019   Procedure: [place];  Surgeon: Ronald Lobo, MD;  [facility];  Service: Endoscopy;;   HEMOSTASIS CONTROL  08/09/2019   Procedure: HEMOSTASIS CONTROL;  Surgeon: Ronald Lobo, MD;  [facility];  Service: Endoscopy;;  epi    IR FLUORO GUIDE CV LINE LEFT  09/02/2019   IR REMOVAL TUN CV CATH W/O FL  10/29/2019   IR US GUIDE VASC ACCESS LEFT  09/02/2019   KNEE SURGERY Left 1980's   none     WRIST SURGERY Left 1970's    There were no vitals filed for this visit.   Subjective Assessment - 04/06/20 1120    Subjective Patient says his legs felt a little heavy this weekend. Says he has been doing some of the home exercise and doing well for the most part, but felt a little limited with heaviness in the legs recently. Says this is feeling a little better today.    Patient Stated Goals To walk with no assistance    Currently in Pain? No/denies                             [facility] Adult PT Treatment/Exercise - 04/06/20 0001      Knee/Hip Exercises: Standing  Heel Raises Both;20 reps    Knee Flexion Both;2 sets;10 reps    Knee Flexion Limitations 2#, slow and controlled    Hip Abduction Both;2 sets;10 reps;Knee straight    Abduction Limitations 2# slow and controlled    Hip Extension Both;2 sets;10 reps;Knee straight    Extension Limitations 2#    Forward Step Up Both;10 reps;1 set;Hand Hold: 2;Step Height: 4"      Knee/Hip Exercises: Seated   Sit to Sand 10 reps;without UE support               Balance Exercises - 04/06/20 0001      Balance Exercises: Standing   Standing Eyes Opened Narrow base of support (BOS);2 reps;30 secs;Foam/compliant surface    Tandem Stance Eyes open;Intermittent upper extremity support;2 reps;30 secs    Retro Gait 2 reps    Sidestepping 2 reps    Other Standing Exercises  NBOS on foam with head turns x 10; NBOS on foam with therapist perturbations in all planes 2 x 30"                PT Short Term Goals - 03/05/20 1219      PT SHORT TERM GOAL #1   Title Patient will be independent with initial HEP and self-management strategies to improve functional outcomes    Time 3    Period Weeks    Status New    Target Date 03/26/20      PT SHORT TERM GOAL #2   Title Patient will report at least 50% overall improvement in subjective complaint to indicate improvement in ability to perform ADLs.    Time 3    Period Weeks    Status New    Target Date 03/26/20             PT Long Term Goals - 03/05/20 1220      PT LONG TERM GOAL #1   Title Patient will report at least 75% overall improvement in subjective complaint to indicate improvement in ability to perform ADLs.    Time 6    Period Weeks    Status New    Target Date 04/16/20      PT LONG TERM GOAL #2   Title Patient will have equal to or > 4/5 MMT throughout BLEs (except LT ankle DF) to improve ability to perform functional mobility, stair ambulation and ADLs.    Time 6    Period Weeks    Status New      PT LONG TERM GOAL #3   Title Patient will be able to ambulate at least 425 feet during 2MWT with LRAD to demonstrate improved ability to perform functional mobility and associated tasks.    Time 6    Period Weeks    Status New    Target Date 04/16/20      PT LONG TERM GOAL #4   Title Patient will be able to maintain semi tandem stance >30 seconds on BLEs to improve stability and reduce risk for falls    Time 6    Period Weeks    Status New    Target Date 04/16/20                 Plan - 04/06/20 1217    Clinical Impression Statement Patient tolerated session well overall today. Patient with some noted fatigue with standing hip strengthening and balance activity. Patient continues to be limited in static and dynamic balance positions due to hip weakens. Patient  tolerated added  NBOS standing on foam with perturbations well, but was well challenged. Educated patient on safety awareness with walking using SPC around and outside his house. He says he wants to do more walking outside, advised him to use AD and have someone with him for assistance if needed.  Paint will continue to benefit from skilled therapy services to progress hip strength and balance to reduce future risk for falls.    Examination-Activity Limitations Stand;Lift;Locomotion Level;Transfers;Carry;Squat;Stairs    Examination-Participation Restrictions Yard Work;Cleaning;Community Activity;Laundry;Volunteer    Stability/Clinical Decision Making Stable/Uncomplicated    Rehab Potential Good    PT Frequency 2x / week    PT Duration 6 weeks    PT Treatment/Interventions ADLs/Self Care Home Management;Aquatic Therapy;Biofeedback;Cryotherapy;Electrical Stimulation;Contrast Bath;Therapeutic exercise;Orthotic Fit/Training;Patient/family education;Manual lymph drainage;Compression bandaging;Taping;Vasopneumatic Device;Splinting;Joint Manipulations;Spinal Manipulations;Energy conservation;Dry needling;Passive range of motion;Manual techniques;Neuromuscular re-education;Balance training;DME Instruction;Gait training;Radiographer, therapeutic;Iontophoresis 4mg /ml Dexamethasone;Moist Heat;Traction;Functional mobility training;Ultrasound;Parrafin;Fluidtherapy;Therapeutic activities    PT Next Visit Plan Progress functional strength, gait and balance as tolerated. Consider adding to aquatic therapy.    PT Home Exercise Plan 5/18: STS, standing hip abduction and staggered tandem stance with UE support. 03/30/20: standing HS curls with support; 6/10: bridge    Consulted and Agree with Plan of Care Patient           Patient will benefit from skilled therapeutic intervention in order to improve the following deficits and impairments:  Abnormal gait, Impaired sensation, Decreased activity tolerance, Decreased  endurance, Decreased strength, Decreased balance, Difficulty walking, Decreased mobility  Visit Diagnosis: Muscle weakness (generalized)  Other abnormalities of gait and mobility     Problem List Patient Active Problem List   Diagnosis Date Noted   CKD (chronic kidney disease), stage IV ([place]) 12/21/2019   Sleep disturbance    Anemia of chronic disease    Dependent on hemodialysis ([place])    Dysphagia, post-stroke    Pressure injury of sacral region, unstageable ([place])    Oropharyngeal dysphagia 10/03/2019   Debility 10/03/2019   Acute blood loss anemia 09/06/2019   H. pylori infection 09/06/2019   CVA (cerebral vascular accident) ([place]) 09/06/2019   Anoxic brain injury ([place]) 09/06/2019   MSSA bacteremia 09/06/2019   Positive D dimer 09/06/2019   Pseudomonas infection 09/06/2019   Infection due to acinetobacter baumannii 09/06/2019   Sinus tachycardia 09/05/2019   Advanced care planning/counseling discussion    Goals of care, counseling/discussion    Palliative care by specialist    Cerebral thrombosis with cerebral infarction 08/28/2019   Tracheostomy status ([place])    Acute respiratory distress syndrome (ARDS) due to COVID-19 virus ([place])    Chest tube in place    Primary spontaneous pneumothorax    Acute respiratory failure ([place])    Gastric ulcer with hemorrhage 08/10/2019   Constipation 08/09/2019   Atrial fibrillation with RVR ([place]) 08/07/2019   Acute renal failure (ARF) ([place]) 08/06/2019   GI bleed 08/06/2019   Pneumothorax on right    Fluid overload 07/27/2019   Pneumonia due to COVID-19 virus 07/25/2019   Acute respiratory failure due to COVID-19 [facility]) 07/25/2019   AKI (acute kidney injury) ([place]) 07/25/2019   Dyspepsia 11/26/2014   BPH (benign prostatic hyperplasia) 10/31/2013   Achilles rupture 04/05/2011   S/P Achilles tendon repair 04/05/2011  12:22 PM, 04/06/20 Josue Hector PT DPT  Physical Therapist with [facility]  [phone] [facility] [address] [place], [place], [address] Phone: [phone]  Fax:  [phone]  Name: [name] MRN: [id number] Date of Birth: [birth year]

## 2020-04-08 ENCOUNTER — Other Ambulatory Visit: Payer: Self-pay

## 2020-04-08 ENCOUNTER — Encounter (HOSPITAL_COMMUNITY): Payer: Self-pay | Admitting: Physical Therapy

## 2020-04-08 ENCOUNTER — Ambulatory Visit (HOSPITAL_COMMUNITY): Payer: Medicare HMO | Admitting: Physical Therapy

## 2020-04-08 ENCOUNTER — Telehealth: Payer: Self-pay | Admitting: Family Medicine

## 2020-04-08 DIAGNOSIS — R2689 Other abnormalities of gait and mobility: Secondary | ICD-10-CM | POA: Diagnosis not present

## 2020-04-08 DIAGNOSIS — M6281 Muscle weakness (generalized): Secondary | ICD-10-CM | POA: Diagnosis not present

## 2020-04-08 NOTE — Therapy (Signed)
[facility] [place], [place], [address] Phone: [phone]   Fax:  [phone]  Physical Therapy Treatment  Patient Details  Name: [name] MRN: [id number] Date of Birth: 1951-02-10 Referring Provider (PT): Sallee Lange MD    Encounter Date: 04/08/2020   PT End of Session - 04/08/20 1035    Visit Number 9    Number of Visits 13    Date for PT Re-Evaluation 04/16/20    Authorization Type [employer] Approved 12 visits    Authorization Time Period 05/14-->6/25    Authorization - Visit Number 8    Authorization - Number of Visits 12    Progress Note Due on Visit 10    PT Start Time 1033    PT Stop Time 1108    PT Time Calculation (min) 35 min    Equipment Utilized During Treatment Gait belt    Activity Tolerance Patient tolerated treatment well    Behavior During Therapy [facility] for tasks assessed/performed           Past Medical History:  Diagnosis Date   CKD (chronic kidney disease), stage IV ([place]) 12/21/2019   Followed by [place] [facility].  Dr.Kruska patient had severe Covid and was on dialysis while in the hospital currently not on dialysis   COVID-19    Heartburn     Past Surgical History:  Procedure Laterality Date   ACHILLES TENDON SURGERY Right 9233   [place]  10/13/2019   Procedure: Basilic Vein Transposition Left Arm;  Surgeon: Angelia Mould, MD;  [facility];  Service: Vascular;;   COLONOSCOPY  2009   [place]   ESOPHAGOGASTRODUODENOSCOPY N/A 12/25/2014   Procedure: ESOPHAGOGASTRODUODENOSCOPY (EGD);  Surgeon: Danie Binder, MD;  [facility];  Service: Endoscopy;  Laterality: N/A;  830am   ESOPHAGOGASTRODUODENOSCOPY N/A 08/09/2019   Procedure: ESOPHAGOGASTRODUODENOSCOPY (EGD);  Surgeon: Ronald Lobo, MD;  Location: [facility];  Service: Endoscopy;  Laterality: N/A;  Patient is already sedated, so I do not anticipate need for further sedation.  Okay  from my standpoint to do either at the bedside or in the operating room   [place]  08/09/2019   Procedure: [place];  Surgeon: Ronald Lobo, MD;  [facility]: [facility];  Service: Endoscopy;;   HEMOSTASIS CONTROL  08/09/2019   Procedure: HEMOSTASIS CONTROL;  Surgeon: Ronald Lobo, MD;  [facility]: [facility];  Service: Endoscopy;;  epi    IR FLUORO GUIDE CV LINE LEFT  09/02/2019   IR REMOVAL TUN CV CATH W/O FL  10/29/2019   IR US GUIDE VASC ACCESS LEFT  09/02/2019   KNEE SURGERY Left 1980's   none     WRIST SURGERY Left 1970's    There were no vitals filed for this visit.   Subjective Assessment - 04/08/20 1303    Subjective Patient reports no new issues. Still having trouble with balance.    Patient Stated Goals To walk with no assistance    Currently in Pain? No/denies                             [facility] Adult PT Treatment/Exercise - 04/08/20 0001      Knee/Hip Exercises: Standing   Heel Raises Both;20 reps    Knee Flexion Both;2 sets;10 reps    Knee Flexion Limitations 2#, slow and controlled    Hip Abduction Both;2 sets;10 reps;Knee straight    Abduction Limitations  2# slow and controlled    Hip Extension Both;2 sets;10 reps;Knee straight    Extension Limitations 2#      Knee/Hip Exercises: Seated   Other Seated Knee/Hip Exercises toe raises  x 10    Sit to Sand 10 reps;without UE support               Balance Exercises - 04/08/20 0001      Balance Exercises: Standing   Tandem Gait Forward;4 reps    Sidestepping 2 reps               PT Short Term Goals - 03/05/20 1219      PT SHORT TERM GOAL #1   Title Patient will be independent with initial HEP and self-management strategies to improve functional outcomes    Time 3    Period Weeks    Status New    Target Date 03/26/20      PT SHORT TERM GOAL #2   Title Patient will report at least 50% overall improvement in subjective complaint to  indicate improvement in ability to perform ADLs.    Time 3    Period Weeks    Status New    Target Date 03/26/20             PT Long Term Goals - 03/05/20 1220      PT LONG TERM GOAL #1   Title Patient will report at least 75% overall improvement in subjective complaint to indicate improvement in ability to perform ADLs.    Time 6    Period Weeks    Status New    Target Date 04/16/20      PT LONG TERM GOAL #2   Title Patient will have equal to or > 4/5 MMT throughout BLEs (except LT ankle DF) to improve ability to perform functional mobility, stair ambulation and ADLs.    Time 6    Period Weeks    Status New      PT LONG TERM GOAL #3   Title Patient will be able to ambulate at least 425 feet during 2MWT with LRAD to demonstrate improved ability to perform functional mobility and associated tasks.    Time 6    Period Weeks    Status New    Target Date 04/16/20      PT LONG TERM GOAL #4   Title Patient will be able to maintain semi tandem stance >30 seconds on BLEs to improve stability and reduce risk for falls    Time 6    Period Weeks    Status New    Target Date 04/16/20                 Plan - 04/08/20 1304    Clinical Impression Statement Patient tolerating LE strengthening well. Patient conitnues to have most difficulty with balance activity. Progress to dynamic balance and stability exercise today per patient chief complaint. Pateint difficulty keeping balance per decreased RT foot coordination with LT sided weakness. Patient able to improve slightly with verbal cues, but requires CG/ Min gaurd for balance with no AD. Will continue to progress these activities as tolerated. Patient educated on performing seated toe raise and supported sidestepping consistenly with HEP to help with this. Patient will continue to benefit from skilled therapy services to progress LE strength and balance to improve functional mobility and reduce risk for falls.     Examination-Activity Limitations Stand;Lift;Locomotion Level;Transfers;Carry;Squat;Stairs    Examination-Participation Restrictions Yard Work;Cleaning;Community Activity;Laundry;Volunteer  Stability/Clinical Decision Making Stable/Uncomplicated    Rehab Potential Good    PT Frequency 2x / week    PT Duration 6 weeks    PT Treatment/Interventions ADLs/Self Care Home Management;Aquatic Therapy;Biofeedback;Cryotherapy;Electrical Stimulation;Contrast Bath;Therapeutic exercise;Orthotic Fit/Training;Patient/family education;Manual lymph drainage;Compression bandaging;Taping;Vasopneumatic Device;Splinting;Joint Manipulations;Spinal Manipulations;Energy conservation;Dry needling;Passive range of motion;Manual techniques;Neuromuscular re-education;Balance training;DME Instruction;Gait training;Radiographer, therapeutic;Iontophoresis 4mg /ml Dexamethasone;Moist Heat;Traction;Functional mobility training;Ultrasound;Parrafin;Fluidtherapy;Therapeutic activities    PT Next Visit Plan Progress functional strength, gait and balance as tolerated. Consider adding to aquatic therapy. Add retro walk next visit    PT Home Exercise Plan 5/18: STS, standing hip abduction and staggered tandem stance with UE support. 03/30/20: standing HS curls with support; 6/10: bridge 04/08/20:  seated toe raises, sidestepping at counter    Consulted and Agree with Plan of Care Patient           Patient will benefit from skilled therapeutic intervention in order to improve the following deficits and impairments:  Abnormal gait, Impaired sensation, Decreased activity tolerance, Decreased endurance, Decreased strength, Decreased balance, Difficulty walking, Decreased mobility  Visit Diagnosis: Muscle weakness (generalized)  Other abnormalities of gait and mobility     Problem List Patient Active Problem List   Diagnosis Date Noted   CKD (chronic kidney disease), stage IV ([place]) 12/21/2019   Sleep disturbance     Anemia of chronic disease    Dependent on hemodialysis (HCC)    Dysphagia, post-stroke    Pressure injury of sacral region, unstageable ([place])    Oropharyngeal dysphagia 10/03/2019   Debility 10/03/2019   Acute blood loss anemia 09/06/2019   H. pylori infection 09/06/2019   CVA (cerebral vascular accident) ([place]) 09/06/2019   Anoxic brain injury ([place]) 09/06/2019   MSSA bacteremia 09/06/2019   Positive D dimer 09/06/2019   Pseudomonas infection 09/06/2019   Infection due to acinetobacter baumannii 09/06/2019   Sinus tachycardia 09/05/2019   Advanced care planning/counseling discussion    Goals of care, counseling/discussion    Palliative care by specialist    Cerebral thrombosis with cerebral infarction 08/28/2019   Tracheostomy status ([place])    Acute respiratory distress syndrome (ARDS) due to COVID-19 virus ([place])    Chest tube in place    Primary spontaneous pneumothorax    Acute respiratory failure ([place])    Gastric ulcer with hemorrhage 08/10/2019   Constipation 08/09/2019   Atrial fibrillation with RVR ([place]) 08/07/2019   Acute renal failure (ARF) ([place]) 08/06/2019   GI bleed 08/06/2019   Pneumothorax on right    Fluid overload 07/27/2019   Pneumonia due to COVID-19 virus 07/25/2019   Acute respiratory failure due to COVID-19 [facility]) 07/25/2019   AKI (acute kidney injury) ([place]) 07/25/2019   Dyspepsia 11/26/2014   BPH (benign prostatic hyperplasia) 10/31/2013   Achilles rupture 04/05/2011   S/P Achilles tendon repair 04/05/2011   1:12 PM, 04/08/20 Josue Hector PT DPT  Physical Therapist with [facility]  [phone] [place] [address] [place], [place], [address] Phone: [phone]   Fax:  [phone]  Name: [name] MRN: [id number] Date of Birth: August 01, 1951

## 2020-04-08 NOTE — Telephone Encounter (Signed)
Hi Erica I filled out the form the best I could.  There seems to be some areas within the form that are asking for financial charges.  All of this time he was in the hospital and rehab we did not do any charges.  Therefore if his insurance company truly wants that the family will be fill back part of the form  Essentially I placed in the diagnosis and the times he was in the hospital We did not take me long to fill out this form.  Nominal charges is fine

## 2020-04-09 DIAGNOSIS — Z029 Encounter for administrative examinations, unspecified: Secondary | ICD-10-CM

## 2020-04-09 NOTE — Telephone Encounter (Signed)
Form is up front for pickup and wife has been notified.

## 2020-04-12 ENCOUNTER — Ambulatory Visit (HOSPITAL_COMMUNITY): Payer: Medicare HMO | Admitting: Physical Therapy

## 2020-04-12 ENCOUNTER — Other Ambulatory Visit: Payer: Self-pay

## 2020-04-12 ENCOUNTER — Encounter (HOSPITAL_COMMUNITY): Payer: Self-pay | Admitting: Physical Therapy

## 2020-04-12 DIAGNOSIS — R2689 Other abnormalities of gait and mobility: Secondary | ICD-10-CM

## 2020-04-12 DIAGNOSIS — M6281 Muscle weakness (generalized): Secondary | ICD-10-CM

## 2020-04-12 NOTE — Therapy (Signed)
[place], [place], [address] Phone: [phone]   Fax:  [phone]  Physical Therapy Treatment/ Progress Note  Patient Details  Name: [name] MRN: [id number] Date of Birth: 08/02/1951 Referring Provider (PT): Sallee Lange MD    Encounter Date: 04/12/2020    Progress Note Reporting Period 03/05/20 to 04/12/20  See note below for Objective Data and Assessment of Progress/Goals.        PT End of Session - 04/12/20 1041    Visit Number 10    Number of Visits 21    Date for PT Re-Evaluation 05/21/20    Authorization Type [employer] Medicare Approved 12 visits (submitted for auth through 05/21/20 check auth )    Authorization Time Period 05/14-->6/25    Authorization - Visit Number 9    Authorization - Number of Visits 12    Progress Note Due on Visit 20    PT Start Time 2035    PT Stop Time 1115    PT Time Calculation (min) 40 min    Equipment Utilized During Treatment Gait belt    Activity Tolerance Patient tolerated treatment well    Behavior During Therapy WFL for tasks assessed/performed           Past Medical History:  Diagnosis Date  . CKD (chronic kidney disease), stage IV ([place]) 12/21/2019   Followed by [place] [facility].  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 COVID-19   . Heartburn     Past Surgical History:  Procedure Laterality Date  . ACHILLES TENDON SURGERY Right 2012  . [place] TRANSPOSITION  10/13/2019   Procedure: Basilic Vein Transposition Left Arm;  Surgeon: Angelia Mould, MD;  [facility] OR;  Service: Vascular;;  . COLONOSCOPY  2009   [facility]  . ESOPHAGOGASTRODUODENOSCOPY N/A 12/25/2014   Procedure: ESOPHAGOGASTRODUODENOSCOPY (EGD);  Surgeon: Danie Binder, MD;  [facility];  Service: Endoscopy;  Laterality: N/A;  830am  . ESOPHAGOGASTRODUODENOSCOPY N/A 08/09/2019   Procedure:  ESOPHAGOGASTRODUODENOSCOPY (EGD);  Surgeon: Ronald Lobo, MD;  Location: [facility];  Service: Endoscopy;  Laterality: N/A;  Patient is already sedated, so I do not anticipate need for further sedation.  Okay from my standpoint to do either at the bedside or in the operating room  . HEMOSTASIS CLIP PLACEMENT  08/09/2019   Procedure: HEMOSTASIS CLIP PLACEMENT;  Surgeon: Ronald Lobo, MD;  [facility];  Service: Endoscopy;;  . HEMOSTASIS CONTROL  08/09/2019   Procedure: HEMOSTASIS CONTROL;  Surgeon: Ronald Lobo, MD;  [facility];  Service: Endoscopy;;  epi   . IR FLUORO GUIDE CV LINE LEFT  09/02/2019  . IR REMOVAL TUN CV CATH W/O FL  10/29/2019  . IR US GUIDE VASC ACCESS LEFT  09/02/2019  . KNEE SURGERY Left 1980's  . none    . WRIST SURGERY Left 1970's    There were no vitals filed for this visit.   Subjective Assessment - 04/12/20 1039    Subjective Patient says he is doing well today. Says his legs were feeling heavy over the weekend. Says he took the day off yesterday and is feeling better today. Says he feels he is improving with therapy, just "takes time". Says he is still having difficulty with balance and walking farther distance.    Patient Stated Goals To walk with no assistance    Currently in Pain? No/denies  [facility] PT Assessment - 04/12/20 0001      Assessment   Medical Diagnosis LT sided weakness     Referring Provider (PT) Sallee Lange MD     Prior Therapy Yes, inpatient and [facility]       Precautions   Precautions Fall      Restrictions   Weight Bearing Restrictions No      Balance Screen   Has the patient fallen in the past 6 months No   none since starting therapy      [place] residence    Living Arrangements Spouse/significant other    Available Help at Discharge Family    Type of [place] entrance    [place] One level      Prior Function   Level  of [place] Retired    Sports coach at [employer]   Overall Cognitive Status Within [place] for tasks assessed      Strength   Right Hip Flexion 4+/5    Right Hip ABduction 4+/5   was 4   Left Hip Flexion 4/5   was 4-   Left Hip Extension 3-/5   was 2+   Left Hip ABduction 3-/5    Right Knee Flexion 4+/5    Right Knee Extension 5/5    Left Knee Flexion 4/5    Left Knee Extension 4-/5    Right Ankle Dorsiflexion 4+/5    Left Ankle Dorsiflexion 2-/5      Transfers   Five time sit to stand comments  17 sec with no UE, much improved eccentric lowering       Ambulation/Gait   Ambulation/Gait Yes    Ambulation/Gait Assistance 5: Supervision;6: Modified independent (Device/Increase time)    Ambulation Distance (Feet) 360 Feet    Assistive device Straight cane    Gait Pattern Decreased arm swing - left;Decreased step length - left;Decreased stance time - left;Decreased stride length;Decreased dorsiflexion - left    Ambulation Surface Level;Indoor    Gait Comments 2MWT      Static Standing Balance   Static Standing Balance -  Activities  Tandam Stance - Right Leg;Tandam Stance - Left Leg    Static Standing - Comment/# of Minutes 14 sec (semi tandem), 30 sec                               Balance Exercises - 04/12/20 0001      Balance Exercises: Standing   Tandem Gait Forward;3 reps    Retro Gait 2 reps    Sidestepping 2 reps             PT Education - 04/12/20 1041    Education Details on reassessment findigs and POC    Person(s) Educated Patient    Methods Explanation    Comprehension Verbalized understanding            PT Short Term Goals - 04/12/20 1106      PT SHORT TERM GOAL #1   Title Patient will be independent with initial HEP and self-management strategies to improve functional outcomes    Baseline Reports compliance    Time 3    Period Weeks    Status Achieved      Target Date 03/26/20      PT SHORT TERM GOAL #2  Title Patient will report at least 50% overall improvement in subjective complaint to indicate improvement in ability to perform ADLs.    Baseline Reports 33% currently    Time 3    Period Weeks    Status On-going    Target Date 03/26/20             PT Long Term Goals - 04/12/20 1107      PT LONG TERM GOAL #1   Title Patient will report at least 75% overall improvement in subjective complaint to indicate improvement in ability to perform ADLs.    Baseline Reports 33%    Time 6    Period Weeks    Status On-going      PT LONG TERM GOAL #2   Title Patient will have equal to or > 4/5 MMT throughout BLEs (except LT ankle DF) to improve ability to perform functional mobility, stair ambulation and ADLs.    Baseline See MMT    Time 6    Period Weeks    Status On-going      PT LONG TERM GOAL #3   Title Patient will be able to ambulate at least 425 feet during 2MWT with LRAD to demonstrate improved ability to perform functional mobility and associated tasks.    Baseline Current 360 feet  with straight cane    Time 6    Period Weeks    Status On-going      PT LONG TERM GOAL #4   Title Patient will be able to maintain semi tandem stance >30 seconds on BLEs to improve stability and reduce risk for falls    Baseline current 14 sec, 30 sec    Time 6    Period Weeks    Status Partially Met                 Plan - 04/12/20 1654    Clinical Impression Statement Patient demos slow, steady progress toward therapy goals. Patient currently with  short term and  long term goals met/ partially met. Patient shows some improvement in static balance and ambulation. Patient continues to be challenged with dynamic balance, and LT sided weakness which continues to negatively impact functional ability. Patient will continue to benefit from skilled therapy services to address remaining deficits to improve LOF with ADLs, functional mobility  tasks and reduce risk for future falls.    Examination-Activity Limitations Stand;Lift;Locomotion Level;Transfers;Carry;Squat;Stairs    Examination-Participation Restrictions Yard Work;Cleaning;Community Activity;Laundry;Volunteer    Stability/Clinical Decision Making Stable/Uncomplicated    Rehab Potential Good    PT Frequency 2x / week    PT Duration 6 weeks    PT Treatment/Interventions ADLs/Self Care Home Management;Aquatic Therapy;Biofeedback;Cryotherapy;Electrical Stimulation;Contrast Bath;Therapeutic exercise;Orthotic Fit/Training;Patient/family education;Manual lymph drainage;Compression bandaging;Taping;Vasopneumatic Device;Splinting;Joint Manipulations;Spinal Manipulations;Energy conservation;Dry needling;Passive range of motion;Manual techniques;Neuromuscular re-education;Balance training;DME Instruction;Gait training;Radiographer, therapeutic;Iontophoresis '4mg'$ /ml Dexamethasone;Moist Heat;Traction;Functional mobility training;Ultrasound;Parrafin;Fluidtherapy;Therapeutic activities    PT Next Visit Plan Progress functional strength, gait and balance as tolerated. Begin aquatic therapy next week. Progress dynamic balance and gait in clinic as tolerated    PT Home Exercise Plan 5/18: STS, standing hip abduction and staggered tandem stance with UE support. 03/30/20: standing HS curls with support; 6/10: bridge 04/08/20:  seated toe raises, sidestepping at counter    Consulted and Agree with Plan of Care Patient           Patient will benefit from skilled therapeutic intervention in order to improve the following deficits and impairments:  Abnormal gait, Impaired sensation, Decreased activity tolerance, Decreased  endurance, Decreased strength, Decreased balance, Difficulty walking, Decreased mobility  Visit Diagnosis: Muscle weakness (generalized)  Other abnormalities of gait and mobility     Problem List Patient Active Problem List   Diagnosis Date Noted  . CKD  (chronic kidney disease), stage IV ([place]) 12/21/2019  . Sleep disturbance   . Anemia of chronic disease   . Dependent on hemodialysis ([place])   . Dysphagia, post-stroke   . Pressure injury of sacral region, unstageable ([place])   . Oropharyngeal dysphagia 10/03/2019  . Debility 10/03/2019  . Acute blood loss anemia 09/06/2019  . H. pylori infection 09/06/2019  . CVA (cerebral vascular accident) ([place]) 09/06/2019  . Anoxic brain injury ([place]) 09/06/2019  . MSSA bacteremia 09/06/2019  . Positive D dimer 09/06/2019  . Pseudomonas infection 09/06/2019  . Infection due to acinetobacter baumannii 09/06/2019  . Sinus tachycardia 09/05/2019  . Advanced care planning/counseling discussion   . Goals of care, counseling/discussion   . Palliative care by specialist   . Cerebral thrombosis with cerebral infarction 08/28/2019  . Tracheostomy status ([place])   . Acute respiratory distress syndrome (ARDS) due to COVID-19 virus ([place])   . Chest tube in place   . Primary spontaneous pneumothorax   . Acute respiratory failure ([place])   . Gastric ulcer with hemorrhage 08/10/2019  . Constipation 08/09/2019  . Atrial fibrillation with RVR ([place]) 08/07/2019  . Acute renal failure (ARF) ([place]) 08/06/2019  . GI bleed 08/06/2019  . Pneumothorax on right   . Fluid overload 07/27/2019  . Pneumonia due to COVID-19 virus 07/25/2019  . Acute respiratory failure due to COVID-19 ([place]) 07/25/2019  . AKI (acute kidney injury) ([place]) 07/25/2019  . Dyspepsia 11/26/2014  . BPH (benign prostatic hyperplasia) 10/31/2013  . Achilles rupture 04/05/2011  . S/P Achilles tendon repair 04/05/2011    6:48 PM, 04/12/20 Josue Hector PT DPT  Physical Therapist with [facility]  [phone]   [facility] [place] [address] [place], [place], [address] Phone: [phone]   Fax:  [phone]  Name: Ramadan Couey MRN: [id number] Date of Birth: Mar 06, 1951

## 2020-04-14 ENCOUNTER — Other Ambulatory Visit: Payer: Self-pay

## 2020-04-14 ENCOUNTER — Ambulatory Visit (HOSPITAL_COMMUNITY): Payer: Medicare HMO | Admitting: Physical Therapy

## 2020-04-14 ENCOUNTER — Encounter (HOSPITAL_COMMUNITY): Payer: Self-pay | Admitting: Physical Therapy

## 2020-04-14 DIAGNOSIS — M6281 Muscle weakness (generalized): Secondary | ICD-10-CM

## 2020-04-14 DIAGNOSIS — R2689 Other abnormalities of gait and mobility: Secondary | ICD-10-CM | POA: Diagnosis not present

## 2020-04-14 NOTE — Therapy (Signed)
[place], [place], [address] Phone: [phone]   Fax:  [phone]  Physical Therapy Treatment  Patient Details  Name: [name] MRN: [id number] Date of Birth: [birth year] Referring Provider (PT): Sallee Lange MD    Encounter Date: 04/14/2020   PT End of Session - 04/14/20 1119    Visit Number 11    Number of Visits 21    Date for PT Re-Evaluation 05/21/20    Authorization Type [employer] Approved 12 visits (submitted for auth through 05/21/20 check auth )    Authorization Time Period 05/14-->6/25    Authorization - Visit Number 10    Authorization - Number of Visits 12    Progress Note Due on Visit 20    PT Start Time 1119    PT Stop Time 1204    PT Time Calculation (min) 45 min    Equipment Utilized During Treatment Gait belt    Activity Tolerance Patient tolerated treatment well    Behavior During Therapy [facility] for tasks assessed/performed           Past Medical History:  Diagnosis Date   CKD (chronic kidney disease), stage IV ([place]) 12/21/2019   Followed by [place] [facility].  Dr.Kruska patient had severe Covid and was on dialysis while in the hospital currently not on dialysis   COVID-19    Heartburn     Past Surgical History:  Procedure Laterality Date   ACHILLES TENDON SURGERY Right [id number]   Susan Moore  10/13/2019   Procedure: Basilic Vein Transposition Left Arm;  Surgeon: Angelia Mould, MD;  Location: [facility];  Service: Vascular;;   COLONOSCOPY  2009   [place]   ESOPHAGOGASTRODUODENOSCOPY N/A 12/25/2014   Procedure: ESOPHAGOGASTRODUODENOSCOPY (EGD);  Surgeon: Danie Binder, MD;  [facility];  Service: Endoscopy;  Laterality: N/A;  830am   ESOPHAGOGASTRODUODENOSCOPY N/A 08/09/2019   Procedure: ESOPHAGOGASTRODUODENOSCOPY (EGD);  Surgeon: Ronald Lobo, MD;  Location[facility];  Service: Endoscopy;  Laterality: N/A;  Patient is already sedated, so I  do not anticipate need for further sedation.  Okay from my standpoint to do either at the bedside or in the operating room   [place]  08/09/2019   Procedure: [place];  Surgeon: Ronald Lobo, MD;  [facility]: [facility] ENDOSCOPY;  Service: Endoscopy;;   HEMOSTASIS CONTROL  08/09/2019   Procedure: HEMOSTASIS CONTROL;  Surgeon: Ronald Lobo, MD;  Location: [facility] ENDOSCOPY;  Service: Endoscopy;;  epi    IR FLUORO GUIDE CV LINE LEFT  09/02/2019   IR REMOVAL TUN CV CATH W/O FL  10/29/2019   IR US GUIDE VASC ACCESS LEFT  09/02/2019   KNEE SURGERY Left 1980's   none     WRIST SURGERY Left 1970's    There were no vitals filed for this visit.   Subjective Assessment - 04/14/20 1123    Subjective Patient says he is feeling pretty good today, not having as much issue with heaviness as last week. No pain currently.    Patient Stated Goals To walk with no assistance    Currently in Pain? No/denies                             [facility] Adult PT Treatment/Exercise - 04/14/20 0001      Knee/Hip Exercises: Machines for Strengthening   Cybex Leg Press 30# DL 2 x 10       Knee/Hip  Exercises: Standing   Heel Raises Both;20 reps    Knee Flexion Both;2 sets;10 reps    Knee Flexion Limitations 2#, slow and controlled    Hip Abduction Both;2 sets;10 reps;Knee straight    Abduction Limitations 2# slow and controlled    Forward Step Up Both;1 set;10 reps;Hand Hold: 2;Step Height: 6"    Other Standing Knee Exercises semi tandem stance, solid floor, 2 x 30" each; alternating step taps 6 inch step x20 intermittent HHA       Knee/Hip Exercises: Seated   Sit to Sand 2 sets;10 reps;without UE support               Balance Exercises - 04/14/20 0001      Balance Exercises: Standing   Tandem Gait Forward;3 reps    Retro Gait 2 reps    Sidestepping 2 reps    Other Standing Exercises cone taps LLE x 5 RT                PT Short Term Goals -  04/12/20 1106      PT SHORT TERM GOAL #1   Title Patient will be independent with initial HEP and self-management strategies to improve functional outcomes    Baseline Reports compliance    Time 3    Period Weeks    Status Achieved    Target Date 03/26/20      PT SHORT TERM GOAL #2   Title Patient will report at least 50% overall improvement in subjective complaint to indicate improvement in ability to perform ADLs.    Baseline Reports 33% currently    Time 3    Period Weeks    Status On-going    Target Date 03/26/20             PT Long Term Goals - 04/12/20 1107      PT LONG TERM GOAL #1   Title Patient will report at least 75% overall improvement in subjective complaint to indicate improvement in ability to perform ADLs.    Baseline Reports 33%    Time 6    Period Weeks    Status On-going      PT LONG TERM GOAL #2   Title Patient will have equal to or > 4/5 MMT throughout BLEs (except LT ankle DF) to improve ability to perform functional mobility, stair ambulation and ADLs.    Baseline See MMT    Time 6    Period Weeks    Status On-going      PT LONG TERM GOAL #3   Title Patient will be able to ambulate at least 425 feet during with LRAD to demonstrate improved ability to perform functional mobility and associated tasks.    Baseline Current 360 feet  with straight cane    Time 6    Period Weeks    Status On-going      PT LONG TERM GOAL #4   Title Patient will be able to maintain semi tandem stance >30 seconds on BLEs to improve stability and reduce risk for falls    Baseline current 14 sec, 30 sec    Time 6    Period Weeks    Status Partially Met                 Plan - 04/14/20 1205    Clinical Impression Statement Patient tolerated session well today and was well challenged with ther ex progressions. Added standing cone taps for improved LLE coordination. Added machine  leg pressing for improved LE strengthening. Patient shows improved stability  with dynamic balance gait training today. Patient did demo LOB x 2 with sidestepping but was able to self-correct. Patient requires verbal cues for weight shifting and foot placement during standing balance activity. Patient cued on foot placement and cadence of strengthening with standing hip abductions and leg pressing. Patient with mild fatigue post session but no complaints otherwise. Patient will continue to benefit from skilled therapy services to progress LE strength and balance to improve functional mobility and reduce risk for falls.    Examination-Activity Limitations Stand;Lift;Locomotion Level;Transfers;Carry;Squat;Stairs    Examination-Participation Restrictions Yard Work;Cleaning;Community Activity;Laundry;Volunteer    Stability/Clinical Decision Making Stable/Uncomplicated    Rehab Potential Good    PT Frequency 2x / week    PT Duration 6 weeks    PT Treatment/Interventions ADLs/Self Care Home Management;Aquatic Therapy;Biofeedback;Cryotherapy;Electrical Stimulation;Contrast Bath;Therapeutic exercise;Orthotic Fit/Training;Patient/family education;Manual lymph drainage;Compression bandaging;Taping;Vasopneumatic Device;Splinting;Joint Manipulations;Spinal Manipulations;Energy conservation;Dry needling;Passive range of motion;Manual techniques;Neuromuscular re-education;Balance training;DME Instruction;Gait training;Radiographer, therapeutic;Iontophoresis '4mg'$ /ml Dexamethasone;Moist Heat;Traction;Functional mobility training;Ultrasound;Parrafin;Fluidtherapy;Therapeutic activities    PT Next Visit Plan Progress functional strength, gait and balance as tolerated. Begin aquatic therapy next week. Try balance with perturbations. Increase leg press resistance.    PT Home Exercise Plan 5/18: STS, standing hip abduction and staggered tandem stance with UE support. 03/30/20: standing HS curls with support; 6/10: bridge 04/08/20:  seated toe raises, sidestepping at counter    Consulted and  Agree with Plan of Care Patient           Patient will benefit from skilled therapeutic intervention in order to improve the following deficits and impairments:  Abnormal gait, Impaired sensation, Decreased activity tolerance, Decreased endurance, Decreased strength, Decreased balance, Difficulty walking, Decreased mobility  Visit Diagnosis: Muscle weakness (generalized)  Other abnormalities of gait and mobility     Problem List Patient Active Problem List   Diagnosis Date Noted   CKD (chronic kidney disease), stage IV ([place]) 12/21/2019   Sleep disturbance    Anemia of chronic disease    Dependent on hemodialysis (HCC)    Dysphagia, post-stroke    Pressure injury of sacral region, unstageable ([place])    Oropharyngeal dysphagia 10/03/2019   Debility 10/03/2019   Acute blood loss anemia 09/06/2019   H. pylori infection 09/06/2019   CVA (cerebral vascular accident) ([place]) 09/06/2019   Anoxic brain injury ([place]) 09/06/2019   MSSA bacteremia 09/06/2019   Positive D dimer 09/06/2019   Pseudomonas infection 09/06/2019   Infection due to acinetobacter baumannii 09/06/2019   Sinus tachycardia 09/05/2019   Advanced care planning/counseling discussion    Goals of care, counseling/discussion    Palliative care by specialist    Cerebral thrombosis with cerebral infarction 08/28/2019   Tracheostomy status ([place])    Acute respiratory distress syndrome (ARDS) due to COVID-19 virus ([place])    Chest tube in place    Primary spontaneous pneumothorax    Acute respiratory failure ([place])    Gastric ulcer with hemorrhage 08/10/2019   Constipation 08/09/2019   Atrial fibrillation with RVR ([place]) 08/07/2019   Acute renal failure (ARF) ([place]) 08/06/2019   GI bleed 08/06/2019   Pneumothorax on right    Fluid overload 07/27/2019   Pneumonia due to COVID-19 virus 07/25/2019   Acute respiratory failure due to COVID-19 ([place]) 07/25/2019   AKI (acute kidney injury) ([place])  07/25/2019   Dyspepsia 11/26/2014   BPH (benign prostatic hyperplasia) 10/31/2013   Achilles rupture 04/05/2011   S/P Achilles tendon repair 04/05/2011    12:18 PM,  04/14/20 Josue Hector PT DPT  Physical Therapist with [facility]  [phone] [place] [address] [place], [place], [address] Phone: [phone]   Fax:  [phone]  Name: [name] MRN: [id number] Date of Birth: 1951/05/11

## 2020-04-19 ENCOUNTER — Encounter (HOSPITAL_COMMUNITY): Payer: Self-pay | Admitting: Physical Therapy

## 2020-04-19 ENCOUNTER — Ambulatory Visit (HOSPITAL_COMMUNITY): Payer: Medicare HMO | Admitting: Physical Therapy

## 2020-04-19 ENCOUNTER — Other Ambulatory Visit: Payer: Self-pay

## 2020-04-19 DIAGNOSIS — R2689 Other abnormalities of gait and mobility: Secondary | ICD-10-CM | POA: Diagnosis not present

## 2020-04-19 DIAGNOSIS — M6281 Muscle weakness (generalized): Secondary | ICD-10-CM | POA: Diagnosis not present

## 2020-04-19 NOTE — Therapy (Signed)
[place], [place], [address] Phone: [phone]   Fax:  [phone]  Physical Therapy Treatment  Patient Details  Name: [name] MRN: [id number] Date of Birth: 19-Feb-1951 Referring Provider (PT): Sallee Lange MD    Encounter Date: 04/19/2020   PT End of Session - 04/19/20 1127    Visit Number 12    Number of Visits 21    Date for PT Re-Evaluation 05/21/20    Authorization Type [employer]    Authorization Time Period 04/16/20-05/21/20    Authorization - Visit Number 10    Authorization - Number of Visits 20    Progress Note Due on Visit 20    PT Start Time 1120    PT Stop Time 1205    PT Time Calculation (min) 45 min    Equipment Utilized During Treatment Gait belt    Activity Tolerance Patient tolerated treatment well    Behavior During Therapy [facility] for tasks assessed/performed           Past Medical History:  Diagnosis Date  . CKD (chronic kidney disease), stage IV ([place]) 12/21/2019   Followed by [place] [facility].  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 COVID-19   . Heartburn     Past Surgical History:  Procedure Laterality Date  . ACHILLES TENDON SURGERY Right 2012  . [place] TRANSPOSITION  10/13/2019   Procedure: Basilic Vein Transposition Left Arm;  Surgeon: Angelia Mould, MD;  Location: [facility] OR;  Service: Vascular;;  . COLONOSCOPY  2009   [facility]  . ESOPHAGOGASTRODUODENOSCOPY N/A 12/25/2014   Procedure: ESOPHAGOGASTRODUODENOSCOPY (EGD);  Surgeon: Danie Binder, MD;  [facility];  Service: Endoscopy;  Laterality: N/A;  830am  . ESOPHAGOGASTRODUODENOSCOPY N/A 08/09/2019   Procedure: ESOPHAGOGASTRODUODENOSCOPY (EGD);  Surgeon: Ronald Lobo, MD;  [facility];  Service: Endoscopy;  Laterality: N/A;  Patient is already sedated, so I do not anticipate need for further sedation.  Okay from my  standpoint to do either at the bedside or in the operating room  . HEMOSTASIS CLIP PLACEMENT  08/09/2019   Procedure: HEMOSTASIS CLIP PLACEMENT;  Surgeon: Ronald Lobo, MD;  [facility];  Service: Endoscopy;;  . HEMOSTASIS CONTROL  08/09/2019   Procedure: HEMOSTASIS CONTROL;  Surgeon: Ronald Lobo, MD;  [facility];  Service: Endoscopy;;  epi   . IR FLUORO GUIDE CV LINE LEFT  09/02/2019  . IR REMOVAL TUN CV CATH W/O FL  10/29/2019  . IR US GUIDE VASC ACCESS LEFT  09/02/2019  . KNEE SURGERY Left 1980's  . none    . WRIST SURGERY Left 1970's    There were no vitals filed for this visit.   Subjective Assessment - 04/19/20 1124    Subjective Patient reports nothing new, says his legs are a little heavy today, nothing bad.    Patient Stated Goals To walk with no assistance    Currently in Pain? No/denies                             [facility] Adult PT Treatment/Exercise - 04/19/20 0001      Knee/Hip Exercises: Aerobic   Recumbent Bike 3 minutes warmup lv 2       Knee/Hip Exercises: Machines for Strengthening   Cybex Leg Press 40# DL 2 x 10       Knee/Hip  Exercises: Standing   Heel Raises Both;20 reps    Knee Flexion Both;2 sets;10 reps    Knee Flexion Limitations 2#, slow and controlled    Hip Abduction Both;2 sets;10 reps;Knee straight    Abduction Limitations 2# slow and controlled    Forward Step Up --    Gait Training 226 feet with no AD, cues for step length and heel strike       Knee/Hip Exercises: Seated   Sit to Sand 2 sets;10 reps;without UE support               Balance Exercises - 04/19/20 0001      Balance Exercises: Standing   Standing Eyes Opened Narrow base of support (BOS);Foam/compliant surface;3 reps;30 secs   with therapist perturbations in all planes    SLS Upper extremity support 1;3 reps;15 secs    Other Standing Exercises step taps on 6 inch step x 20                PT Short Term Goals - 04/12/20  1106      PT SHORT TERM GOAL #1   Title Patient will be independent with initial HEP and self-management strategies to improve functional outcomes    Baseline Reports compliance    Time 3    Period Weeks    Status Achieved    Target Date 03/26/20      PT SHORT TERM GOAL #2   Title Patient will report at least 50% overall improvement in subjective complaint to indicate improvement in ability to perform ADLs.    Baseline Reports 33% currently    Time 3    Period Weeks    Status On-going    Target Date 03/26/20             PT Long Term Goals - 04/12/20 1107      PT LONG TERM GOAL #1   Title Patient will report at least 75% overall improvement in subjective complaint to indicate improvement in ability to perform ADLs.    Baseline Reports 33%    Time 6    Period Weeks    Status On-going      PT LONG TERM GOAL #2   Title Patient will have equal to or > 4/5 MMT throughout BLEs (except LT ankle DF) to improve ability to perform functional mobility, stair ambulation and ADLs.    Baseline See MMT    Time 6    Period Weeks    Status On-going      PT LONG TERM GOAL #3   Title Patient will be able to ambulate at least 425 feet during 2MWT with LRAD to demonstrate improved ability to perform functional mobility and associated tasks.    Baseline Current 360 feet  with straight cane    Time 6    Period Weeks    Status On-going      PT LONG TERM GOAL #4   Title Patient will be able to maintain semi tandem stance >30 seconds on BLEs to improve stability and reduce risk for falls    Baseline current 14 sec, 30 sec    Time 6    Period Weeks    Status Partially Met                 Plan - 04/19/20 1616    Clinical Impression Statement Patient continues to have difficulty with LLE placement and coordination with certain activity. Patient required verbal and tactile cues with LT foot during  step taps and placement with leg pressing. Patient with noted LE fatigue post treatment  today. Added standing balance on foam with perturbations for improved dynamic balance in standing. Patient will continue to benefit from skilled therapy services to progress LE strength and balance to improve functional mobility and reduce risk for falls.    Examination-Activity Limitations Stand;Lift;Locomotion Level;Transfers;Carry;Squat;Stairs    Examination-Participation Restrictions Yard Work;Cleaning;Community Activity;Laundry;Volunteer    Stability/Clinical Decision Making Stable/Uncomplicated    Rehab Potential Good    PT Frequency 2x / week    PT Duration 6 weeks    PT Treatment/Interventions ADLs/Self Care Home Management;Aquatic Therapy;Biofeedback;Cryotherapy;Electrical Stimulation;Contrast Bath;Therapeutic exercise;Orthotic Fit/Training;Patient/family education;Manual lymph drainage;Compression bandaging;Taping;Vasopneumatic Device;Splinting;Joint Manipulations;Spinal Manipulations;Energy conservation;Dry needling;Passive range of motion;Manual techniques;Neuromuscular re-education;Balance training;DME Instruction;Gait training;Radiographer, therapeutic;Iontophoresis 64m/ml Dexamethasone;Moist Heat;Traction;Functional mobility training;Ultrasound;Parrafin;Fluidtherapy;Therapeutic activities    PT Next Visit Plan Progress functional strength, gait and balance as tolerated. Begin aquatic therapy next visit    PT Home Exercise Plan 5/18: STS, standing hip abduction and staggered tandem stance with UE support. 03/30/20: standing HS curls with support; 6/10: bridge 04/08/20:  seated toe raises, sidestepping at counter    Consulted and Agree with Plan of Care Patient           Patient will benefit from skilled therapeutic intervention in order to improve the following deficits and impairments:  Abnormal gait, Impaired sensation, Decreased activity tolerance, Decreased endurance, Decreased strength, Decreased balance, Difficulty walking, Decreased mobility  Visit  Diagnosis: Muscle weakness (generalized)  Other abnormalities of gait and mobility     Problem List Patient Active Problem List   Diagnosis Date Noted  . CKD (chronic kidney disease), stage IV ([place] 12/21/2019  . Sleep disturbance   . Anemia of chronic disease   . Dependent on hemodialysis ([place]   . Dysphagia, post-stroke   . Pressure injury of sacral region, unstageable ([place]   . Oropharyngeal dysphagia 10/03/2019  . Debility 10/03/2019  . Acute blood loss anemia 09/06/2019  . H. pylori infection 09/06/2019  . CVA (cerebral vascular accident) ([place] 09/06/2019  . Anoxic brain injury ([place] 09/06/2019  . MSSA bacteremia 09/06/2019  . Positive D dimer 09/06/2019  . Pseudomonas infection 09/06/2019  . Infection due to acinetobacter baumannii 09/06/2019  . Sinus tachycardia 09/05/2019  . Advanced care planning/counseling discussion   . Goals of care, counseling/discussion   . Palliative care by specialist   . Cerebral thrombosis with cerebral infarction 08/28/2019  . Tracheostomy status ([place]   . Acute respiratory distress syndrome (ARDS) due to COVID-19 virus ([place]   . Chest tube in place   . Primary spontaneous pneumothorax   . Acute respiratory failure ([place]   . Gastric ulcer with hemorrhage 08/10/2019  . Constipation 08/09/2019  . Atrial fibrillation with RVR ([place] 08/07/2019  . Acute renal failure (ARF) ([place] 08/06/2019  . GI bleed 08/06/2019  . Pneumothorax on right   . Fluid overload 07/27/2019  . Pneumonia due to COVID-19 virus 07/25/2019  . Acute respiratory failure due to COVID-19 ([place] 07/25/2019  . AKI (acute kidney injury) ([place] 07/25/2019  . Dyspepsia 11/26/2014  . BPH (benign prostatic hyperplasia) 10/31/2013  . Achilles rupture 04/05/2011  . S/P Achilles tendon repair 04/05/2011    4:32 PM, 04/19/20 CJosue HectorPT DPT  [facility] [phone] [place] [address]Phone: [phone]  Fax:  [phone] Name: RNagi FurioMRN: [id number]  of Birth: 06-Jun-1951

## 2020-04-21 ENCOUNTER — Ambulatory Visit (HOSPITAL_COMMUNITY): Payer: Medicare HMO | Admitting: Physical Therapy

## 2020-04-21 ENCOUNTER — Encounter (HOSPITAL_COMMUNITY): Payer: Self-pay

## 2020-04-21 ENCOUNTER — Other Ambulatory Visit: Payer: Self-pay

## 2020-04-22 ENCOUNTER — Encounter (HOSPITAL_COMMUNITY): Payer: Self-pay | Admitting: Physical Therapy

## 2020-04-22 ENCOUNTER — Ambulatory Visit (HOSPITAL_COMMUNITY): Payer: Medicare HMO | Attending: Family Medicine | Admitting: Physical Therapy

## 2020-04-22 DIAGNOSIS — M6281 Muscle weakness (generalized): Secondary | ICD-10-CM | POA: Diagnosis not present

## 2020-04-22 DIAGNOSIS — R2689 Other abnormalities of gait and mobility: Secondary | ICD-10-CM | POA: Diagnosis not present

## 2020-04-22 NOTE — Therapy (Signed)
[place], [place], [address] Phone: [phone]   Fax:  [phone]  Physical Therapy Treatment  Patient Details  Name: [name] MRN: [id number] Date of Birth: August 11, 1951 Referring Provider (PT): Sallee Lange MD    Encounter Date: 04/22/2020   PT End of Session - 04/22/20 1442    Visit Number 13    Number of Visits 21    Date for PT Re-Evaluation 05/21/20    Authorization Type [employer]    Authorization Time Period 04/16/20-05/21/20    Authorization - Visit Number 11    Authorization - Number of Visits 20    Progress Note Due on Visit 20    PT Start Time 1300    PT Stop Time 1345    PT Time Calculation (min) 45 min    Activity Tolerance Patient tolerated treatment well    Behavior During Therapy [facility] for tasks assessed/performed           Past Medical History:  Diagnosis Date  . CKD (chronic kidney disease), stage IV ([place]) 12/21/2019   Followed by [place] [facility].  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 COVID-19   . Heartburn     Past Surgical History:  Procedure Laterality Date  . ACHILLES TENDON SURGERY Right 2012  . [place] TRANSPOSITION  10/13/2019   Procedure: Basilic Vein Transposition Left Arm;  Surgeon: Angelia Mould, MD;  Location: [facility] OR;  Service: Vascular;;  . COLONOSCOPY  2009   IH  . ESOPHAGOGASTRODUODENOSCOPY N/A 12/25/2014   Procedure: ESOPHAGOGASTRODUODENOSCOPY (EGD);  Surgeon: Danie Binder, MD;  [facility];  Service: Endoscopy;  Laterality: N/A;  830am  . ESOPHAGOGASTRODUODENOSCOPY N/A 08/09/2019   Procedure: ESOPHAGOGASTRODUODENOSCOPY (EGD);  Surgeon: Ronald Lobo, MD;  Location[facility];  Service: Endoscopy;  Laterality: N/A;  Patient is already sedated, so I do not anticipate need for further sedation.  Okay from my standpoint to do either at the bedside or in the  operating room  . HEMOSTASIS CLIP PLACEMENT  08/09/2019   Procedure: HEMOSTASIS CLIP PLACEMENT;  Surgeon: Ronald Lobo, MD;  Location: [facility];  Service: Endoscopy;;  . HEMOSTASIS CONTROL  08/09/2019   Procedure: HEMOSTASIS CONTROL;  Surgeon: Ronald Lobo, MD;  Location: [facility] ENDOSCOPY;  Service: Endoscopy;;  epi   . IR FLUORO GUIDE CV LINE LEFT  09/02/2019  . IR REMOVAL TUN CV CATH W/O FL  10/29/2019  . IR US GUIDE VASC ACCESS LEFT  09/02/2019  . KNEE SURGERY Left 1980's  . none    . WRIST SURGERY Left 1970's    There were no vitals filed for this visit.   Subjective Assessment - 04/22/20 1441    Subjective Patient reports no new complaints, just notes slight heaviness about LLE today.    Patient Stated Goals To walk with no assistance    Currently in Pain? No/denies                         Adult Aquatic Therapy - 04/22/20 1447      Treatment   Gait poolwalking (with pool noodle support), sidestepping, retro walking 30' 3RT each (all with min gaurd assist)    Exercises tandem stance 4 x 30" each, unsupported standing balance 2 x60", SLS with intermittent HHA 5 x 10" each, heel raise x20, standing hip abduction 2 x 10 each, mini squats with  pool DB 2 x 10                        PT Short Term Goals - 04/12/20 1106      PT SHORT TERM GOAL #1   Title Patient will be independent with initial HEP and self-management strategies to improve functional outcomes    Baseline Reports compliance    Time 3    Period Weeks    Status Achieved    Target Date 03/26/20      PT SHORT TERM GOAL #2   Title Patient will report at least 50% overall improvement in subjective complaint to indicate improvement in ability to perform ADLs.    Baseline Reports 33% currently    Time 3    Period Weeks    Status On-going    Target Date 03/26/20             PT Long Term Goals - 04/12/20 1107      PT LONG TERM GOAL #1   Title Patient will report at least 75%  overall improvement in subjective complaint to indicate improvement in ability to perform ADLs.    Baseline Reports 33%    Time 6    Period Weeks    Status On-going      PT LONG TERM GOAL #2   Title Patient will have equal to or > 4/5 MMT throughout BLEs (except LT ankle DF) to improve ability to perform functional mobility, stair ambulation and ADLs.    Baseline See MMT    Time 6    Period Weeks    Status On-going      PT LONG TERM GOAL #3   Title Patient will be able to ambulate at least 425 feet during 2MWT with LRAD to demonstrate improved ability to perform functional mobility and associated tasks.    Baseline Current 360 feet  with straight cane    Time 6    Period Weeks    Status On-going      PT LONG TERM GOAL #4   Title Patient will be able to maintain semi tandem stance >30 seconds on BLEs to improve stability and reduce risk for falls    Baseline current 14 sec, 30 sec    Time 6    Period Weeks    Status Partially Met                 Plan - 04/22/20 1442    Clinical Impression Statement Patient tolerated session well today. Initiated aquatic therapy today, patient was educated on benefits and application. Performed dynamic balance series and static balance activity. Patient well challnged with these, requiring min gaurd and verbal cues for foot placement to maintain balance. Patient shows improved righting reactions when outside BOS with assist of gravity minimized pool setting. Patient able to perform LE strengthening exercise with good ROM, does require verbal cues for LLE positioning with hip abduction and squatting. Patient will continue to benefit from skilled therapy services to progress LE strength and balance to improve functional mobility and reduce risk for falls.    Examination-Activity Limitations Stand;Lift;Locomotion Level;Transfers;Carry;Squat;Stairs    Examination-Participation Restrictions Yard Work;Cleaning;Community Activity;Laundry;Volunteer     Stability/Clinical Decision Making Stable/Uncomplicated    Rehab Potential Good    PT Frequency 2x / week    PT Duration 6 weeks    PT Treatment/Interventions ADLs/Self Care Home Management;Aquatic Therapy;Biofeedback;Cryotherapy;Electrical Stimulation;Contrast Bath;Therapeutic exercise;Orthotic Fit/Training;Patient/family education;Manual lymph drainage;Compression bandaging;Taping;Vasopneumatic Device;Splinting;Joint Manipulations;Spinal Manipulations;Energy conservation;Dry needling;Passive  range of motion;Manual techniques;Neuromuscular re-education;Balance training;DME Instruction;Gait training;Radiographer, therapeutic;Iontophoresis 24m/ml Dexamethasone;Moist Heat;Traction;Functional mobility training;Ultrasound;Parrafin;Fluidtherapy;Therapeutic activities    PT Next Visit Plan Progress functional strength, gait and balance as tolerated.    PT Home Exercise Plan 5/18: STS, standing hip abduction and staggered tandem stance with UE support. 03/30/20: standing HS curls with support; 6/10: bridge 04/08/20:  seated toe raises, sidestepping at counter    Consulted and Agree with Plan of Care Patient           Patient will benefit from skilled therapeutic intervention in order to improve the following deficits and impairments:  Abnormal gait, Impaired sensation, Decreased activity tolerance, Decreased endurance, Decreased strength, Decreased balance, Difficulty walking, Decreased mobility  Visit Diagnosis: Muscle weakness (generalized)  Other abnormalities of gait and mobility     Problem List Patient Active Problem List   Diagnosis Date Noted  . CKD (chronic kidney disease), stage IV ([place] 12/21/2019  . Sleep disturbance   . Anemia of chronic disease   . Dependent on hemodialysis ([place]   . Dysphagia, post-stroke   . Pressure injury of sacral region, unstageable ([place]   . Oropharyngeal dysphagia 10/03/2019  . Debility 10/03/2019  . Acute blood loss anemia 09/06/2019   . H. pylori infection 09/06/2019  . CVA (cerebral vascular accident) ([place] 09/06/2019  . Anoxic brain injury ([place] 09/06/2019  . MSSA bacteremia 09/06/2019  . Positive D dimer 09/06/2019  . Pseudomonas infection 09/06/2019  . Infection due to acinetobacter baumannii 09/06/2019  . Sinus tachycardia 09/05/2019  . Advanced care planning/counseling discussion   . Goals of care, counseling/discussion   . Palliative care by specialist   . Cerebral thrombosis with cerebral infarction 08/28/2019  . Tracheostomy status ([place]   . Acute respiratory distress syndrome (ARDS) due to COVID-19 virus ([place]   . Chest tube in place   . Primary spontaneous pneumothorax   . Acute respiratory failure ([place]   . Gastric ulcer with hemorrhage 08/10/2019  . Constipation 08/09/2019  . Atrial fibrillation with RVR ([place] 08/07/2019  . Acute renal failure (ARF) ([place] 08/06/2019  . GI bleed 08/06/2019  . Pneumothorax on right   . Fluid overload 07/27/2019  . Pneumonia due to COVID-19 virus 07/25/2019  . Acute respiratory failure due to COVID-19 ([place] 07/25/2019  . AKI (acute kidney injury) ([place] 07/25/2019  . Dyspepsia 11/26/2014  . BPH (benign prostatic hyperplasia) 10/31/2013  . Achilles rupture 04/05/2011  . S/P Achilles tendon repair 04/05/2011   2:52 PM, 04/22/20 CJosue HectorPT DPT  [facility] [phone] [address] [place] [address]Phone: [phone]  Fax:  [phone] Name: RTobias AvitabileMRN: [id number] of Birth: [birth year]

## 2020-04-29 ENCOUNTER — Other Ambulatory Visit: Payer: Self-pay

## 2020-04-29 ENCOUNTER — Ambulatory Visit (HOSPITAL_COMMUNITY): Payer: Medicare HMO | Admitting: Physical Therapy

## 2020-04-29 ENCOUNTER — Encounter (HOSPITAL_COMMUNITY): Payer: Self-pay | Admitting: Physical Therapy

## 2020-04-29 DIAGNOSIS — M6281 Muscle weakness (generalized): Secondary | ICD-10-CM | POA: Diagnosis not present

## 2020-04-29 DIAGNOSIS — R2689 Other abnormalities of gait and mobility: Secondary | ICD-10-CM

## 2020-04-29 NOTE — Therapy (Signed)
[place], [place], [address] Phone: [phone]   Fax:  [phone]  Physical Therapy Treatment  Patient Details  Name: [name] MRN: [id number] Date of Birth: 27-Feb-1951 Referring Provider (PT): Sallee Lange MD    Encounter Date: 04/29/2020   PT End of Session - 04/29/20 1707    Visit Number 14    Number of Visits 21    Date for PT Re-Evaluation 05/21/20    Authorization Type [employer]    Authorization Time Period 04/16/20-05/21/20    Authorization - Visit Number 12    Authorization - Number of Visits 20    Progress Note Due on Visit 20    PT Start Time 2111    PT Stop Time 1348    PT Time Calculation (min) 43 min    Activity Tolerance Patient tolerated treatment well    Behavior During Therapy [facility] for tasks assessed/performed           Past Medical History:  Diagnosis Date  . CKD (chronic kidney disease), stage IV ([place]) 12/21/2019   Followed by [place] [facility].  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 COVID-19   . Heartburn     Past Surgical History:  Procedure Laterality Date  . ACHILLES TENDON SURGERY Right 2012  . [place] TRANSPOSITION  10/13/2019   Procedure: Basilic Vein Transposition Left Arm;  Surgeon: Angelia Mould, MD;  Location: [facility] OR;  Service: Vascular;;  . COLONOSCOPY  2009   IH  . ESOPHAGOGASTRODUODENOSCOPY N/A 12/25/2014   Procedure: ESOPHAGOGASTRODUODENOSCOPY (EGD);  Surgeon: Danie Binder, MD;  [facility];  Service: Endoscopy;  Laterality: N/A;  830am  . ESOPHAGOGASTRODUODENOSCOPY N/A 08/09/2019   Procedure: ESOPHAGOGASTRODUODENOSCOPY (EGD);  Surgeon: Ronald Lobo, MD;  [facility];  Service: Endoscopy;  Laterality: N/A;  Patient is already sedated, so I do not anticipate need for further sedation.  Okay from my standpoint to do either at the bedside or in the  operating room  . HEMOSTASIS CLIP PLACEMENT  08/09/2019   Procedure: HEMOSTASIS CLIP PLACEMENT;  Surgeon: Ronald Lobo, MD;  [facility];  Service: Endoscopy;;  . HEMOSTASIS CONTROL  08/09/2019   Procedure: HEMOSTASIS CONTROL;  Surgeon: Ronald Lobo, MD;  [facility];  Service: Endoscopy;;  epi   . IR FLUORO GUIDE CV LINE LEFT  09/02/2019  . IR REMOVAL TUN CV CATH W/O FL  10/29/2019  . IR US GUIDE VASC ACCESS LEFT  09/02/2019  . KNEE SURGERY Left 1980's  . none    . WRIST SURGERY Left 1970's    There were no vitals filed for this visit.   Subjective Assessment - 04/29/20 1706    Subjective [name] reports ongoing feeling of "heaviness" in LEs, not sure why. Says it is better today though.    Patient Stated Goals To walk with no assistance    Currently in Pain? No/denies                         Adult Aquatic Therapy - 04/29/20 1712      Treatment   Gait poolwalking (with pool DB support), sidestepping, retro walking 30' 3RT each (all with CG assist)    Exercises tandem stance 2 x 30" each using pool DBs, 2 x 30" each using pool noodle, heel raise x20, mini squats with pool DB 2 x 10, noodle  leg press 2 x 10 each                         PT Short Term Goals - 04/12/20 1106      PT SHORT TERM GOAL #1   Title Patient will be independent with initial HEP and self-management strategies to improve functional outcomes    Baseline Reports compliance    Time 3    Period Weeks    Status Achieved    Target Date 03/26/20      PT SHORT TERM GOAL #2   Title Patient will report at least 50% overall improvement in subjective complaint to indicate improvement in ability to perform ADLs.    Baseline Reports 33% currently    Time 3    Period Weeks    Status On-going    Target Date 03/26/20             PT Long Term Goals - 04/12/20 1107      PT LONG TERM GOAL #1   Title Patient will report at least 75% overall improvement in  subjective complaint to indicate improvement in ability to perform ADLs.    Baseline Reports 33%    Time 6    Period Weeks    Status On-going      PT LONG TERM GOAL #2   Title Patient will have equal to or > 4/5 MMT throughout BLEs (except LT ankle DF) to improve ability to perform functional mobility, stair ambulation and ADLs.    Baseline See MMT    Time 6    Period Weeks    Status On-going      PT LONG TERM GOAL #3   Title Patient will be able to ambulate at least 425 feet during 2MWT with LRAD to demonstrate improved ability to perform functional mobility and associated tasks.    Baseline Current 360 feet  with straight cane    Time 6    Period Weeks    Status On-going      PT LONG TERM GOAL #4   Title Patient will be able to maintain semi tandem stance >30 seconds on BLEs to improve stability and reduce risk for falls    Baseline current 14 sec, 30 sec    Time 6    Period Weeks    Status Partially Met                 Plan - 04/29/20 1707    Clinical Impression Statement Patient tolerated session well today. [name] continues to have some difficulty with coordinating LLE with dynamic balance and gait activity in pool but is able to improve with verbal cues. Patient cued on proper form, mechanics and weight shifting with mini squatting. Patient cued on approximating LLE and maintaining even weight through both LEs during standing balance activity. Patient showed some improvement in static balance exercise using pool noodle support.    Examination-Activity Limitations Stand;Lift;Locomotion Level;Transfers;Carry;Squat;Stairs    Examination-Participation Restrictions Yard Work;Cleaning;Community Activity;Laundry;Volunteer    Stability/Clinical Decision Making Stable/Uncomplicated    Rehab Potential Good    PT Frequency 2x / week    PT Duration 6 weeks    PT Treatment/Interventions ADLs/Self Care Home Management;Aquatic Therapy;Biofeedback;Cryotherapy;Electrical  Stimulation;Contrast Bath;Therapeutic exercise;Orthotic Fit/Training;Patient/family education;Manual lymph drainage;Compression bandaging;Taping;Vasopneumatic Device;Splinting;Joint Manipulations;Spinal Manipulations;Energy conservation;Dry needling;Passive range of motion;Manual techniques;Neuromuscular re-education;Balance training;DME Instruction;Gait training;Radiographer, therapeutic;Iontophoresis 58m/ml Dexamethasone;Moist Heat;Traction;Functional mobility training;Ultrasound;Parrafin;Fluidtherapy;Therapeutic activities    PT Next Visit Plan Progress functional strength, gait and balance  as tolerated.    PT Home Exercise Plan 5/18: STS, standing hip abduction and staggered tandem stance with UE support. 03/30/20: standing HS curls with support; 6/10: bridge 04/08/20:  seated toe raises, sidestepping at counter    Consulted and Agree with Plan of Care Patient           Patient will benefit from skilled therapeutic intervention in order to improve the following deficits and impairments:  Abnormal gait, Impaired sensation, Decreased activity tolerance, Decreased endurance, Decreased strength, Decreased balance, Difficulty walking, Decreased mobility  Visit Diagnosis: Muscle weakness (generalized)  Other abnormalities of gait and mobility     Problem List Patient Active Problem List   Diagnosis Date Noted  . CKD (chronic kidney disease), stage IV ([place]) 12/21/2019  . Sleep disturbance   . Anemia of chronic disease   . Dependent on hemodialysis ([place])   . Dysphagia, post-stroke   . Pressure injury of sacral region, unstageable ([place])   . Oropharyngeal dysphagia 10/03/2019  . Debility 10/03/2019  . Acute blood loss anemia 09/06/2019  . H. pylori infection 09/06/2019  . CVA (cerebral vascular accident) ([place]) 09/06/2019  . Anoxic brain injury ([place]) 09/06/2019  . MSSA bacteremia 09/06/2019  . Positive D dimer 09/06/2019  . Pseudomonas infection 09/06/2019  . Infection  due to acinetobacter baumannii 09/06/2019  . Sinus tachycardia 09/05/2019  . Advanced care planning/counseling discussion   . Goals of care, counseling/discussion   . Palliative care by specialist   . Cerebral thrombosis with cerebral infarction 08/28/2019  . Tracheostomy status ([place])   . Acute respiratory distress syndrome (ARDS) due to COVID-19 virus ([place])   . Chest tube in place   . Primary spontaneous pneumothorax   . Acute respiratory failure ([place])   . Gastric ulcer with hemorrhage 08/10/2019  . Constipation 08/09/2019  . Atrial fibrillation with RVR ([place]) 08/07/2019  . Acute renal failure (ARF) ([place]) 08/06/2019  . GI bleed 08/06/2019  . Pneumothorax on right   . Fluid overload 07/27/2019  . Pneumonia due to COVID-19 virus 07/25/2019  . Acute respiratory failure due to COVID-19 ([place]) 07/25/2019  . AKI (acute kidney injury) ([place]) 07/25/2019  . Dyspepsia 11/26/2014  . BPH (benign prostatic hyperplasia) 10/31/2013  . Achilles rupture 04/05/2011  . S/P Achilles tendon repair 04/05/2011   5:15 PM, 04/29/20 Josue Hector PT DPT  Physical Therapist with [facility]  [phone] [place] [address] [place], [place], [address] Phone: [phone]   Fax:  [phone]  Name: [name] MRN: [id number] Date of Birth: 06/04/1951

## 2020-04-30 DIAGNOSIS — R972 Elevated prostate specific antigen [PSA]: Secondary | ICD-10-CM | POA: Diagnosis not present

## 2020-05-03 DIAGNOSIS — L89153 Pressure ulcer of sacral region, stage 3: Secondary | ICD-10-CM | POA: Diagnosis not present

## 2020-05-03 DIAGNOSIS — M6281 Muscle weakness (generalized): Secondary | ICD-10-CM | POA: Diagnosis not present

## 2020-05-03 DIAGNOSIS — U071 COVID-19: Secondary | ICD-10-CM | POA: Diagnosis not present

## 2020-05-06 ENCOUNTER — Other Ambulatory Visit: Payer: Self-pay

## 2020-05-06 ENCOUNTER — Encounter (HOSPITAL_COMMUNITY): Payer: Self-pay | Admitting: Physical Therapy

## 2020-05-06 ENCOUNTER — Ambulatory Visit (HOSPITAL_COMMUNITY): Payer: Medicare HMO | Admitting: Physical Therapy

## 2020-05-06 DIAGNOSIS — M6281 Muscle weakness (generalized): Secondary | ICD-10-CM

## 2020-05-06 DIAGNOSIS — R2689 Other abnormalities of gait and mobility: Secondary | ICD-10-CM | POA: Diagnosis not present

## 2020-05-06 NOTE — Therapy (Signed)
[facility] [address] [place], [place], [address] Phone: [phone]   Fax:  [phone]  Physical Therapy Treatment  Patient Details  Name: [name] MRN: [id number] Date of Birth: 1951/01/15 Referring Provider (PT): Lilyan Punt MD    Encounter Date: 05/06/2020   PT End of Session - 05/06/20 1718    Visit Number 15    Number of Visits 21    Date for PT Re-Evaluation 05/21/20    Authorization Type [employer]    Authorization Time Period 04/16/20-05/21/20    Authorization - Visit Number 13    Authorization - Number of Visits 20    Progress Note Due on Visit 20    PT Start Time 1300    PT Stop Time 1345    PT Time Calculation (min) 45 min    Activity Tolerance Patient tolerated treatment well    Behavior During Therapy [facility] for tasks assessed/performed           Past Medical History:  Diagnosis Date   CKD (chronic kidney disease), stage IV (HCC) 12/21/2019   Followed by [facility].  Dr.Kruska patient had severe Covid and was on dialysis while in the hospital currently not on dialysis   COVID-19    Heartburn     Past Surgical History:  Procedure Laterality Date   ACHILLES TENDON SURGERY Right 2012   BASCILIC VEIN TRANSPOSITION  10/13/2019   Procedure: Basilic Vein Transposition Left Arm;  Surgeon: Chuck Hint, MD;  Location: [facility] OR;  Service: Vascular;;   COLONOSCOPY  2009   IH   ESOPHAGOGASTRODUODENOSCOPY N/A 12/25/2014   Procedure: ESOPHAGOGASTRODUODENOSCOPY (EGD);  Surgeon: West Bali, MD;  [facility];  Service: Endoscopy;  Laterality: N/A;  830am   ESOPHAGOGASTRODUODENOSCOPY N/A 08/09/2019   Procedure: ESOPHAGOGASTRODUODENOSCOPY (EGD);  Surgeon: Bernette Redbird, MD;  Location: [facility];  Service: Endoscopy;  Laterality: N/A;  Patient is already sedated, so I do not anticipate need for further sedation.  Okay from my standpoint to do either at the bedside or in the  operating room   HEMOSTASIS CLIP PLACEMENT  08/09/2019   Procedure: HEMOSTASIS CLIP PLACEMENT;  Surgeon: Bernette Redbird, MD;  Location: [facility];  Service: Endoscopy;;   HEMOSTASIS CONTROL  08/09/2019   Procedure: HEMOSTASIS CONTROL;  Surgeon: Bernette Redbird, MD;  Location: [facility] ENDOSCOPY;  Service: Endoscopy;;  epi    IR FLUORO GUIDE CV LINE LEFT  09/02/2019   IR REMOVAL TUN CV CATH W/O FL  10/29/2019   IR US GUIDE VASC ACCESS LEFT  09/02/2019   KNEE SURGERY Left 1980's   none     WRIST SURGERY Left 1970's    There were no vitals filed for this visit.   Subjective Assessment - 05/06/20 1717    Subjective Patient states he got a new cane and it is working well. Says it has been working better so far.    Patient Stated Goals To walk with no assistance    Currently in Pain? No/denies                         Adult Aquatic Therapy - 05/06/20 1730      Treatment   Gait poolwalking (with pool DB support), sidestepping, retro walking 30' 3RT each (all with CG assist)    Exercises tandem stance 3 x 30" each using pool noodle, heel raise x20, step taps x20 with HHA x 2,  noodle leg press 2 x  10 each, standing hip abduction with 1# weight 2 x10 each, noodle push/pull in staggered stance x 20 each leg front                          PT Short Term Goals - 04/12/20 1106      PT SHORT TERM GOAL #1   Title Patient will be independent with initial HEP and self-management strategies to improve functional outcomes    Baseline Reports compliance    Time 3    Period Weeks    Status Achieved    Target Date 03/26/20      PT SHORT TERM GOAL #2   Title Patient will report at least 50% overall improvement in subjective complaint to indicate improvement in ability to perform ADLs.    Baseline Reports 33% currently    Time 3    Period Weeks    Status On-going    Target Date 03/26/20             PT Long Term Goals - 04/12/20 1107      PT LONG TERM  GOAL #1   Title Patient will report at least 75% overall improvement in subjective complaint to indicate improvement in ability to perform ADLs.    Baseline Reports 33%    Time 6    Period Weeks    Status On-going      PT LONG TERM GOAL #2   Title Patient will have equal to or > 4/5 MMT throughout BLEs (except LT ankle DF) to improve ability to perform functional mobility, stair ambulation and ADLs.    Baseline See MMT    Time 6    Period Weeks    Status On-going      PT LONG TERM GOAL #3   Title Patient will be able to ambulate at least 425 feet during 2MWT with LRAD to demonstrate improved ability to perform functional mobility and associated tasks.    Baseline Current 360 feet  with straight cane    Time 6    Period Weeks    Status On-going      PT LONG TERM GOAL #4   Title Patient will be able to maintain semi tandem stance >30 seconds on BLEs to improve stability and reduce risk for falls    Baseline current 14 sec, 30 sec    Time 6    Period Weeks    Status Partially Met                 Plan - 05/06/20 1719    Clinical Impression Statement Patient continues to be challenged by LLE coordination with dynamic balance and noodle leg press activity. Patient cued on pacing of activity and using visual cues to improve accuracy with foot placement. Added step taps to work on this as well, with black tile line as target. Progressed LE strengthening in pool with added 1# ankle weight to standing hip abduction.    Examination-Activity Limitations Stand;Lift;Locomotion Level;Transfers;Carry;Squat;Stairs    Examination-Participation Restrictions Yard Work;Cleaning;Community Activity;Laundry;Volunteer    Stability/Clinical Decision Making Stable/Uncomplicated    Rehab Potential Good    PT Frequency 2x / week    PT Duration 6 weeks    PT Treatment/Interventions ADLs/Self Care Home Management;Aquatic Therapy;Biofeedback;Cryotherapy;Electrical Stimulation;Contrast Bath;Therapeutic  exercise;Orthotic Fit/Training;Patient/family education;Manual lymph drainage;Compression bandaging;Taping;Vasopneumatic Device;Splinting;Joint Manipulations;Spinal Manipulations;Energy conservation;Dry needling;Passive range of motion;Manual techniques;Neuromuscular re-education;Balance training;DME Instruction;Gait training;Radiographer, therapeutic;Iontophoresis '4mg'$ /ml Dexamethasone;Moist Heat;Traction;Functional mobility training;Ultrasound;Parrafin;Fluidtherapy;Therapeutic activities    PT Next Visit  Plan Progress functional strength, gait and balance as tolerated. Continue strengthening with machines    PT Home Exercise Plan 5/18: STS, standing hip abduction and staggered tandem stance with UE support. 03/30/20: standing HS curls with support; 6/10: bridge 04/08/20:  seated toe raises, sidestepping at counter    Consulted and Agree with Plan of Care Patient           Patient will benefit from skilled therapeutic intervention in order to improve the following deficits and impairments:  Abnormal gait, Impaired sensation, Decreased activity tolerance, Decreased endurance, Decreased strength, Decreased balance, Difficulty walking, Decreased mobility  Visit Diagnosis: Muscle weakness (generalized)  Other abnormalities of gait and mobility     Problem List Patient Active Problem List   Diagnosis Date Noted   CKD (chronic kidney disease), stage IV ([place]) 12/21/2019   Sleep disturbance    Anemia of chronic disease    Dependent on hemodialysis (HCC)    Dysphagia, post-stroke    Pressure injury of sacral region, unstageable ([place])    Oropharyngeal dysphagia 10/03/2019   Debility 10/03/2019   Acute blood loss anemia 09/06/2019   H. pylori infection 09/06/2019   CVA (cerebral vascular accident) ([place]) 09/06/2019   Anoxic brain injury ([place]) 09/06/2019   MSSA bacteremia 09/06/2019   Positive D dimer 09/06/2019   Pseudomonas infection 09/06/2019   Infection due  to acinetobacter baumannii 09/06/2019   Sinus tachycardia 09/05/2019   Advanced care planning/counseling discussion    Goals of care, counseling/discussion    Palliative care by specialist    Cerebral thrombosis with cerebral infarction 08/28/2019   Tracheostomy status ([place])    Acute respiratory distress syndrome (ARDS) due to COVID-19 virus ([place])    Chest tube in place    Primary spontaneous pneumothorax    Acute respiratory failure ([place])    Gastric ulcer with hemorrhage 08/10/2019   Constipation 08/09/2019   Atrial fibrillation with RVR ([place]) 08/07/2019   Acute renal failure (ARF) ([place]) 08/06/2019   GI bleed 08/06/2019   Pneumothorax on right    Fluid overload 07/27/2019   Pneumonia due to COVID-19 virus 07/25/2019   Acute respiratory failure due to COVID-19 [facility]) 07/25/2019   AKI (acute kidney injury) ([place]) 07/25/2019   Dyspepsia 11/26/2014   BPH (benign prostatic hyperplasia) 10/31/2013   Achilles rupture 04/05/2011   S/P Achilles tendon repair 04/05/2011    5:33 PM, 05/06/20 Josue Hector PT DPT  Physical Therapist with [facility]  [phone] [place] [address] [place], [place], [address] Phone: [phone]   Fax:  [phone]  Name: [name] MRN: [id number] Date of Birth: Nov 03, 1950

## 2020-05-07 DIAGNOSIS — R972 Elevated prostate specific antigen [PSA]: Secondary | ICD-10-CM | POA: Diagnosis not present

## 2020-05-07 DIAGNOSIS — N5201 Erectile dysfunction due to arterial insufficiency: Secondary | ICD-10-CM | POA: Diagnosis not present

## 2020-05-09 ENCOUNTER — Other Ambulatory Visit: Payer: Self-pay | Admitting: Adult Health

## 2020-05-09 DIAGNOSIS — E785 Hyperlipidemia, unspecified: Secondary | ICD-10-CM

## 2020-05-13 ENCOUNTER — Ambulatory Visit (HOSPITAL_COMMUNITY): Payer: Medicare HMO | Admitting: Physical Therapy

## 2020-05-13 ENCOUNTER — Encounter (HOSPITAL_COMMUNITY): Payer: Self-pay | Admitting: Physical Therapy

## 2020-05-13 ENCOUNTER — Other Ambulatory Visit: Payer: Self-pay

## 2020-05-13 DIAGNOSIS — M6281 Muscle weakness (generalized): Secondary | ICD-10-CM

## 2020-05-13 DIAGNOSIS — R2689 Other abnormalities of gait and mobility: Secondary | ICD-10-CM | POA: Diagnosis not present

## 2020-05-13 NOTE — Therapy (Signed)
[facility] [address] [place], [place], [address] Phone: [phone]   Fax:  [phone]  Physical Therapy Treatment  Patient Details  Name: [name] MRN: [id number] Date of Birth: 1950/11/23 Referring Provider (PT): Lilyan Punt MD    Encounter Date: 05/13/2020   PT End of Session - 05/13/20 1805    Visit Number 16    Number of Visits 21    Date for PT Re-Evaluation 05/21/20    Authorization Type [employer]    Authorization Time Period 04/16/20-05/21/20    Authorization - Visit Number 14    Authorization - Number of Visits 20    Progress Note Due on Visit 20    PT Start Time 1300    PT Stop Time 1350    PT Time Calculation (min) 50 min    Activity Tolerance Patient tolerated treatment well    Behavior During Therapy [facility] for tasks assessed/performed           Past Medical History:  Diagnosis Date  . CKD (chronic kidney disease), stage IV (HCC) 12/21/2019   Followed by [facility].  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 COVID-19   . Heartburn     Past Surgical History:  Procedure Laterality Date  . ACHILLES TENDON SURGERY Right 2012  . BASCILIC VEIN TRANSPOSITION  10/13/2019   Procedure: Basilic Vein Transposition Left Arm;  Surgeon: Chuck Hint, MD;  Location: [facility] OR;  Service: Vascular;;  . COLONOSCOPY  2009   IH  . ESOPHAGOGASTRODUODENOSCOPY N/A 12/25/2014   Procedure: ESOPHAGOGASTRODUODENOSCOPY (EGD);  Surgeon: West Bali, MD;  [facility];  Service: Endoscopy;  Laterality: N/A;  830am  . ESOPHAGOGASTRODUODENOSCOPY N/A 08/09/2019   Procedure: ESOPHAGOGASTRODUODENOSCOPY (EGD);  Surgeon: Bernette Redbird, MD;  Location: [facility];  Service: Endoscopy;  Laterality: N/A;  Patient is already sedated, so I do not anticipate need for further sedation.  Okay from my standpoint to do either at the bedside or in the  operating room  . HEMOSTASIS CLIP PLACEMENT  08/09/2019   Procedure: HEMOSTASIS CLIP PLACEMENT;  Surgeon: Bernette Redbird, MD;  [facility];  Service: Endoscopy;;  . HEMOSTASIS CONTROL  08/09/2019   Procedure: HEMOSTASIS CONTROL;  Surgeon: Bernette Redbird, MD;  [facility];  Service: Endoscopy;;  epi   . IR FLUORO GUIDE CV LINE LEFT  09/02/2019  . IR REMOVAL TUN CV CATH W/O FL  10/29/2019  . IR US GUIDE VASC ACCESS LEFT  09/02/2019  . KNEE SURGERY Left 1980's  . none    . WRIST SURGERY Left 1970's    There were no vitals filed for this visit.   Subjective Assessment - 05/13/20 1804    Subjective Patient says he has been working hard on his exercises at home. No new issues currently.    Patient Stated Goals To walk with no assistance    Currently in Pain? No/denies                         Adult Aquatic Therapy - 05/13/20 1809      Treatment   Gait poolwalking (with pool DB support), sidestepping, retro walking 30' 3RT each; 2 laps pool walking unassisted    Exercises tandem stance 2 x 30" each using pool noodle, heel raise x20, step taps x20 with HHA x 2, standing hip abduction with 3# weight 2 x10 each,  mini squats wiht DB support 2 x10, LE crossovers x20 each leg intermittent HHA, standing hamstring curl 3# 2 x10 each, LE marching 3# x20 each                           PT Short Term Goals - 04/12/20 1106      PT SHORT TERM GOAL #1   Title Patient will be independent with initial HEP and self-management strategies to improve functional outcomes    Baseline Reports compliance    Time 3    Period Weeks    Status Achieved    Target Date 03/26/20      PT SHORT TERM GOAL #2   Title Patient will report at least 50% overall improvement in subjective complaint to indicate improvement in ability to perform ADLs.    Baseline Reports 33% currently    Time 3    Period Weeks    Status On-going    Target Date 03/26/20              PT Long Term Goals - 04/12/20 1107      PT LONG TERM GOAL #1   Title Patient will report at least 75% overall improvement in subjective complaint to indicate improvement in ability to perform ADLs.    Baseline Reports 33%    Time 6    Period Weeks    Status On-going      PT LONG TERM GOAL #2   Title Patient will have equal to or > 4/5 MMT throughout BLEs (except LT ankle DF) to improve ability to perform functional mobility, stair ambulation and ADLs.    Baseline See MMT    Time 6    Period Weeks    Status On-going      PT LONG TERM GOAL #3   Title Patient will be able to ambulate at least 425 feet during 2MWT with LRAD to demonstrate improved ability to perform functional mobility and associated tasks.    Baseline Current 360 feet  with straight cane    Time 6    Period Weeks    Status On-going      PT LONG TERM GOAL #4   Title Patient will be able to maintain semi tandem stance >30 seconds on BLEs to improve stability and reduce risk for falls    Baseline current 14 sec, 30 sec    Time 6    Period Weeks    Status Partially Met                 Plan - 05/13/20 1805    Clinical Impression Statement Patient shows improved LLE coordination during pool walking and step tapping today. Added unsupported LE crossovers to progress limb coordination. Patient required verbal cues for approximating LLE and heel to floor with tandem standing, also for upright posturing when LLE is in back. Patient able to perform unassisted pool walking with improved form and stability. Will continue to benefit from skilled therapy services to progress LE strength and balance to improve functional mobility.    Examination-Activity Limitations Stand;Lift;Locomotion Level;Transfers;Carry;Squat;Stairs    Examination-Participation Restrictions Yard Work;Cleaning;Community Activity;Laundry;Volunteer    Stability/Clinical Decision Making Stable/Uncomplicated    Rehab Potential Good    PT Frequency 2x /  week    PT Duration 6 weeks    PT Treatment/Interventions ADLs/Self Care Home Management;Aquatic Therapy;Biofeedback;Cryotherapy;Electrical Stimulation;Contrast Bath;Therapeutic exercise;Orthotic Fit/Training;Patient/family education;Manual lymph drainage;Compression bandaging;Taping;Vasopneumatic Device;Splinting;Joint Manipulations;Spinal Manipulations;Energy conservation;Dry needling;Passive range of motion;Manual techniques;Neuromuscular re-education;Balance  training;DME Instruction;Gait training;Radiographer, therapeutic;Iontophoresis '4mg'$ /ml Dexamethasone;Moist Heat;Traction;Functional mobility training;Ultrasound;Parrafin;Fluidtherapy;Therapeutic activities    PT Next Visit Plan Reassess next week per end of cert. Progress functional strength, gait and balance as tolerated. Continue strengthening with machines    PT Home Exercise Plan 5/18: STS, standing hip abduction and staggered tandem stance with UE support. 03/30/20: standing HS curls with support; 6/10: bridge 04/08/20:  seated toe raises, sidestepping at counter    Consulted and Agree with Plan of Care Patient           Patient will benefit from skilled therapeutic intervention in order to improve the following deficits and impairments:  Abnormal gait, Impaired sensation, Decreased activity tolerance, Decreased endurance, Decreased strength, Decreased balance, Difficulty walking, Decreased mobility  Visit Diagnosis: Muscle weakness (generalized)  Other abnormalities of gait and mobility     Problem List Patient Active Problem List   Diagnosis Date Noted  . CKD (chronic kidney disease), stage IV ([place]) 12/21/2019  . Sleep disturbance   . Anemia of chronic disease   . Dependent on hemodialysis ([place])   . Dysphagia, post-stroke   . Pressure injury of sacral region, unstageable ([place])   . Oropharyngeal dysphagia 10/03/2019  . Debility 10/03/2019  . Acute blood loss anemia 09/06/2019  . H. pylori infection  09/06/2019  . CVA (cerebral vascular accident) ([place]) 09/06/2019  . Anoxic brain injury ([place]) 09/06/2019  . MSSA bacteremia 09/06/2019  . Positive D dimer 09/06/2019  . Pseudomonas infection 09/06/2019  . Infection due to acinetobacter baumannii 09/06/2019  . Sinus tachycardia 09/05/2019  . Advanced care planning/counseling discussion   . Goals of care, counseling/discussion   . Palliative care by specialist   . Cerebral thrombosis with cerebral infarction 08/28/2019  . Tracheostomy status ([place])   . Acute respiratory distress syndrome (ARDS) due to COVID-19 virus ([place])   . Chest tube in place   . Primary spontaneous pneumothorax   . Acute respiratory failure ([place])   . Gastric ulcer with hemorrhage 08/10/2019  . Constipation 08/09/2019  . Atrial fibrillation with RVR ([place]) 08/07/2019  . Acute renal failure (ARF) ([place]) 08/06/2019  . GI bleed 08/06/2019  . Pneumothorax on right   . Fluid overload 07/27/2019  . Pneumonia due to COVID-19 virus 07/25/2019  . Acute respiratory failure due to COVID-19 ([place]) 07/25/2019  . AKI (acute kidney injury) ([place]) 07/25/2019  . Dyspepsia 11/26/2014  . BPH (benign prostatic hyperplasia) 10/31/2013  . Achilles rupture 04/05/2011  . S/P Achilles tendon repair 04/05/2011   6:13 PM, 05/13/20 Josue Hector PT DPT  Physical Therapist with [facility]  [phone]   [facility] [address] [place], [place], [address] Phone: [phone]   Fax:  [phone]  Name: Mubashir Mallek MRN: [id number] Date of Birth: [birth year]

## 2020-06-02 ENCOUNTER — Encounter (HOSPITAL_COMMUNITY): Payer: Self-pay | Admitting: Physical Therapy

## 2020-06-02 ENCOUNTER — Ambulatory Visit (HOSPITAL_COMMUNITY): Payer: Medicare HMO | Attending: Family Medicine | Admitting: Physical Therapy

## 2020-06-02 ENCOUNTER — Other Ambulatory Visit: Payer: Self-pay

## 2020-06-02 DIAGNOSIS — M6281 Muscle weakness (generalized): Secondary | ICD-10-CM | POA: Insufficient documentation

## 2020-06-02 DIAGNOSIS — R2689 Other abnormalities of gait and mobility: Secondary | ICD-10-CM

## 2020-06-02 NOTE — Therapy (Signed)
[facility] [address] [place], [place], [address] Phone: [phone]   Fax:  [phone]  Physical Therapy Treatment  Patient Details  Name: [name] MRN: [id number] Date of Birth: Dec 27, 1950 Referring Provider (PT): Sallee Lange MD   Progress Note Reporting Period 04/12/20 to 06/02/20  See note below for Objective Data and Assessment of Progress/Goals.       Encounter Date: 06/02/2020   PT End of Session - 06/02/20 1311    Visit Number 17    Number of Visits 24    Date for PT Re-Evaluation 07/02/20    Authorization Type [employer]    Authorization Time Period Check auth    Authorization - Visit Number 1    Authorization - Number of Visits 8    Progress Note Due on Visit 24    PT Start Time 1305    PT Stop Time 1345    PT Time Calculation (min) 40 min    Equipment Utilized During Treatment Gait belt    Activity Tolerance Patient tolerated treatment well    Behavior During Therapy WFL for tasks assessed/performed           Past Medical History:  Diagnosis Date   CKD (chronic kidney disease), stage IV ([place]) 12/21/2019   Followed by [place] [facility].  Dr.Kruska patient had severe Covid and was on dialysis while in the hospital currently not on dialysis   COVID-19    Heartburn     Past Surgical History:  Procedure Laterality Date   ACHILLES TENDON SURGERY Right 9476   [place]  10/13/2019   Procedure: Basilic Vein Transposition Left Arm;  Surgeon: Angelia Mould, MD;  Location: [facility] OR;  Service: Vascular;;   COLONOSCOPY  2009   [place]   ESOPHAGOGASTRODUODENOSCOPY N/A 12/25/2014   Procedure: ESOPHAGOGASTRODUODENOSCOPY (EGD);  Surgeon: Danie Binder, MD;  [facility];  Service: Endoscopy;  Laterality: N/A;  830am   ESOPHAGOGASTRODUODENOSCOPY N/A 08/09/2019   Procedure: ESOPHAGOGASTRODUODENOSCOPY (EGD);  Surgeon: Ronald Lobo, MD;  Location: [facility];  Service:  Endoscopy;  Laterality: N/A;  Patient is already sedated, so I do not anticipate need for further sedation.  Okay from my standpoint to do either at the bedside or in the operating room   [place]  08/09/2019   Procedure: [place];  Surgeon: Ronald Lobo, MD;  Location: [facility];  Service: Endoscopy;;   HEMOSTASIS CONTROL  08/09/2019   Procedure: HEMOSTASIS CONTROL;  Surgeon: Ronald Lobo, MD;  Location: [facility];  Service: Endoscopy;;  epi    IR FLUORO GUIDE CV LINE LEFT  09/02/2019   IR REMOVAL TUN CV CATH W/O FL  10/29/2019   IR US GUIDE VASC ACCESS LEFT  09/02/2019   KNEE SURGERY Left 1980's   none     WRIST SURGERY Left 1970's    There were no vitals filed for this visit.   Subjective Assessment - 06/02/20 1315    Subjective Patient says he is doing well overall and feels that he is getting better. Says [place], and was able to do more walking. Denies any recent falls.    Patient Stated Goals To walk with no assistance    Currently in Pain? No/denies              [facility] PT Assessment - 06/02/20 0001      Assessment   Medical Diagnosis LT sided weakness  Referring Provider (PT) Sallee Lange MD     Prior Therapy Yes, inpatient and [facility]       Precautions   Precautions Fall      Restrictions   Weight Bearing Restrictions No      Balance Screen   Has the patient fallen in the past 6 months No      [place] residence    Living Arrangements Spouse/significant other      Prior Function   Level of Independence Independent      Cognition   Overall Cognitive Status Within Functional Limits for tasks assessed      Strength   Overall Strength Comments tested in seated     Right Hip Flexion 4+/5    Right Hip ABduction 4+/5    Left Hip Flexion 4+/5   was 4   Left Hip ABduction 4/5    Right Knee Extension 5/5    Left Knee Extension 4/5   was 4-   Right  Ankle Dorsiflexion 4+/5    Left Ankle Dorsiflexion 3+/5   was 2-     Transfers   Five time sit to stand comments  12 sec with no UEs, slight unsteadiness/ decreased control on initial reps but improved with practice      Ambulation/Gait   Ambulation/Gait Yes    Ambulation/Gait Assistance 6: Modified independent (Device/Increase time)    Ambulation Distance (Feet) 400 Feet    Assistive device Straight cane    Gait Pattern Decreased step length - left;Decreased dorsiflexion - left    Ambulation Surface Level;Indoor    Gait Comments 2MWT      Static Standing Balance   Static Standing Balance -  Activities  Tandam Stance - Right Leg;Tandam Stance - Left Leg    Static Standing - Comment/# of Minutes 15 sec. 6 sec; (can do 30 sec bilateral with staggered stance)                                  PT Education - 06/02/20 1316    Education Details on reassessment findings and POC    Person(s) Educated Patient    Methods Explanation    Comprehension Verbalized understanding            PT Short Term Goals - 06/02/20 1339      PT SHORT TERM GOAL #1   Title Patient will be independent with initial HEP and self-management strategies to improve functional outcomes    Baseline Reports compliance    Time 3    Period Weeks    Status Achieved    Target Date 03/26/20      PT SHORT TERM GOAL #2   Title Patient will report at least 50% overall improvement in subjective complaint to indicate improvement in ability to perform ADLs.    Baseline Reports 65%    Time 3    Period Weeks    Status Achieved    Target Date 03/26/20             PT Long Term Goals - 06/02/20 1339      PT LONG TERM GOAL #1   Title Patient will report at least 75% overall improvement in subjective complaint to indicate improvement in ability to perform ADLs.    Baseline Reports 65%    Time 6    Period Weeks    Status On-going  PT LONG TERM GOAL #2   Title Patient will have equal  to or > 4/5 MMT throughout BLEs (except LT ankle DF) to improve ability to perform functional mobility, stair ambulation and ADLs.    Baseline See MMT    Time 6    Period Weeks    Status Partially Met      PT LONG TERM GOAL #3   Title Patient will be able to ambulate at least 425 feet during 2MWT with LRAD to demonstrate improved ability to perform functional mobility and associated tasks.    Baseline Current 400 feet with straight cane    Time 6    Period Weeks    Status On-going      PT LONG TERM GOAL #4   Title Patient will be able to maintain semi tandem stance >30 seconds on BLEs to improve stability and reduce risk for falls    Baseline Can hold 30 sec bilaterally in staggered stance    Time 6    Period Weeks    Status Partially Met                 Plan - 06/02/20 1640    Clinical Impression Statement Patient is making good progress toward therapy goals. Patient shows improved gait speed and strength testing. Patient continues to be limited by LT side weakness, limb coordination, and balance deficits which are negatively impacting functional ability. Patient will continue to benefit from skilled therapy services to address remaining deficits to improve LOF with ADLs and functional mobility and reduce risk for falls.    Examination-Activity Limitations Stand;Lift;Locomotion Level;Transfers;Carry;Squat;Stairs    Examination-Participation Restrictions Yard Work;Cleaning;Community Activity;Laundry;Volunteer    Stability/Clinical Decision Making Stable/Uncomplicated    Rehab Potential Good    PT Frequency 2x / week    PT Duration 4 weeks    PT Treatment/Interventions ADLs/Self Care Home Management;Aquatic Therapy;Biofeedback;Cryotherapy;Electrical Stimulation;Contrast Bath;Therapeutic exercise;Orthotic Fit/Training;Patient/family education;Manual lymph drainage;Compression bandaging;Taping;Vasopneumatic Device;Splinting;Joint Manipulations;Spinal Manipulations;Energy  conservation;Dry needling;Passive range of motion;Manual techniques;Neuromuscular re-education;Balance training;DME Instruction;Gait training;Radiographer, therapeutic;Iontophoresis '4mg'$ /ml Dexamethasone;Moist Heat;Traction;Functional mobility training;Ultrasound;Parrafin;Fluidtherapy;Therapeutic activities    PT Next Visit Plan Progress functional strength, gait and balance as tolerated. Continue strengthening with machines    PT Home Exercise Plan 5/18: STS, standing hip abduction and staggered tandem stance with UE support. 03/30/20: standing HS curls with support; 6/10: bridge 04/08/20:  seated toe raises, sidestepping at counter    Consulted and Agree with Plan of Care Patient           Patient will benefit from skilled therapeutic intervention in order to improve the following deficits and impairments:  Abnormal gait, Impaired sensation, Decreased activity tolerance, Decreased endurance, Decreased strength, Decreased balance, Difficulty walking, Decreased mobility  Visit Diagnosis: Muscle weakness (generalized)  Other abnormalities of gait and mobility     Problem List Patient Active Problem List   Diagnosis Date Noted   CKD (chronic kidney disease), stage IV ([place]) 12/21/2019   Sleep disturbance    Anemia of chronic disease    Dependent on hemodialysis ([place])    Dysphagia, post-stroke    Pressure injury of sacral region, unstageable ([place])    Oropharyngeal dysphagia 10/03/2019   Debility 10/03/2019   Acute blood loss anemia 09/06/2019   H. pylori infection 09/06/2019   CVA (cerebral vascular accident) ([place]) 09/06/2019   Anoxic brain injury ([place]) 09/06/2019   MSSA bacteremia 09/06/2019   Positive D dimer 09/06/2019   Pseudomonas infection 09/06/2019   Infection due to acinetobacter baumannii 09/06/2019   Sinus tachycardia  09/05/2019   Advanced care planning/counseling discussion    Goals of care, counseling/discussion    Palliative care by  specialist    Cerebral thrombosis with cerebral infarction 08/28/2019   Tracheostomy status ([place])    Acute respiratory distress syndrome (ARDS) due to COVID-19 virus ([place])    Chest tube in place    Primary spontaneous pneumothorax    Acute respiratory failure ([place])    Gastric ulcer with hemorrhage 08/10/2019   Constipation 08/09/2019   Atrial fibrillation with RVR ([place]) 08/07/2019   Acute renal failure (ARF) ([place]) 08/06/2019   GI bleed 08/06/2019   Pneumothorax on right    Fluid overload 07/27/2019   Pneumonia due to COVID-19 virus 07/25/2019   Acute respiratory failure due to COVID-19 [facility]) 07/25/2019   AKI (acute kidney injury) ([place]) 07/25/2019   Dyspepsia 11/26/2014   BPH (benign prostatic hyperplasia) 10/31/2013   Achilles rupture 04/05/2011   S/P Achilles tendon repair 04/05/2011   4:44 PM, 06/02/20 Josue Hector PT DPT  Physical Therapist with [facility]  [phone] [facility] [address] [place], [place], [address] Phone: [phone]   Fax:  [phone]  Name: Khalil Szczepanik MRN: [id number] Date of Birth: 01-22-1951

## 2020-06-03 DIAGNOSIS — L89153 Pressure ulcer of sacral region, stage 3: Secondary | ICD-10-CM | POA: Diagnosis not present

## 2020-06-03 DIAGNOSIS — M6281 Muscle weakness (generalized): Secondary | ICD-10-CM | POA: Diagnosis not present

## 2020-06-03 DIAGNOSIS — U071 COVID-19: Secondary | ICD-10-CM | POA: Diagnosis not present

## 2020-06-10 ENCOUNTER — Other Ambulatory Visit: Payer: Self-pay

## 2020-06-10 ENCOUNTER — Ambulatory Visit (HOSPITAL_COMMUNITY): Payer: Medicare HMO | Admitting: Physical Therapy

## 2020-06-10 DIAGNOSIS — R2689 Other abnormalities of gait and mobility: Secondary | ICD-10-CM | POA: Diagnosis not present

## 2020-06-10 DIAGNOSIS — M6281 Muscle weakness (generalized): Secondary | ICD-10-CM | POA: Diagnosis not present

## 2020-06-11 ENCOUNTER — Encounter (HOSPITAL_COMMUNITY): Payer: Self-pay | Admitting: Physical Therapy

## 2020-06-11 NOTE — Therapy (Signed)
[facility] [place], [place], [address] Phone: [phone]   Fax:  [phone]  Physical Therapy Treatment  Patient Details  Name: [name] MRN: [id number] Date of Birth: 21-Jan-1951 Referring Provider (PT): Sallee Lange MD    Encounter Date: 06/10/2020   PT End of Session - 06/11/20 1328    Visit Number 18    Number of Visits 24    Date for PT Re-Evaluation 07/02/20    Authorization Type [employer]    Authorization Time Period 8//11/21-9/10/21    Authorization - Visit Number 2    Authorization - Number of Visits 8    Progress Note Due on Visit 24    PT Start Time 1600    PT Stop Time 1650    PT Time Calculation (min) 50 min    Activity Tolerance Patient tolerated treatment well    Behavior During Therapy [facility] for tasks assessed/performed           Past Medical History:  Diagnosis Date  . CKD (chronic kidney disease), stage IV ([place]) 12/21/2019   Followed by [place] [facility].  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 COVID-19   . Heartburn     Past Surgical History:  Procedure Laterality Date  . ACHILLES TENDON SURGERY Right 2012  . [place] TRANSPOSITION  10/13/2019   Procedure: Basilic Vein Transposition Left Arm;  Surgeon: Angelia Mould, MD;  Location: [facility] OR;  Service: Vascular;;  . COLONOSCOPY  2009   [facility]  . ESOPHAGOGASTRODUODENOSCOPY N/A 12/25/2014   Procedure: ESOPHAGOGASTRODUODENOSCOPY (EGD);  Surgeon: Danie Binder, MD;  [facility];  Service: Endoscopy;  Laterality: N/A;  830am  . ESOPHAGOGASTRODUODENOSCOPY N/A 08/09/2019   Procedure: ESOPHAGOGASTRODUODENOSCOPY (EGD);  Surgeon: Ronald Lobo, MD;  Location: [facility];  Service: Endoscopy;  Laterality: N/A;  Patient is already sedated, so I do not anticipate need for further sedation.  Okay from my standpoint to do either at the bedside or in the  operating room  . HEMOSTASIS CLIP PLACEMENT  08/09/2019   Procedure: HEMOSTASIS CLIP PLACEMENT;  Surgeon: Ronald Lobo, MD;  [facility];  Service: Endoscopy;;  . HEMOSTASIS CONTROL  08/09/2019   Procedure: HEMOSTASIS CONTROL;  Surgeon: Ronald Lobo, MD;  [facility];  Service: Endoscopy;;  epi   . IR FLUORO GUIDE CV LINE LEFT  09/02/2019  . IR REMOVAL TUN CV CATH W/O FL  10/29/2019  . IR US GUIDE VASC ACCESS LEFT  09/02/2019  . KNEE SURGERY Left 1980's  . none    . WRIST SURGERY Left 1970's    There were no vitals filed for this visit.   Subjective Assessment - 06/11/20 1327    Subjective Patient says he is doing well and can tell he is progressing. Says he has been working hard at home but is still challenged with his balance and wants to be able to walk farther.    Patient Stated Goals To walk with no assistance    Currently in Pain? No/denies                         Adult Aquatic Therapy - 06/11/20 1333      Treatment   Gait poolwalking (with pool DB support), sidestepping, retro walking 30' 3RT each; 1 lap pool walking unassisted    Exercises heel raise x20, step taps x20 with HHA x  2, standing hip abduction with 3# weight 2 x10 each, sit to stand from 2nd step 2 x10, LE crossovers x20 each leg HHA x1, standing hamstring curl 3# 2 x10 each, step up on LLE x10 HHA x 1                          PT Short Term Goals - 06/02/20 1339      PT SHORT TERM GOAL #1   Title Patient will be independent with initial HEP and self-management strategies to improve functional outcomes    Baseline Reports compliance    Time 3    Period Weeks    Status Achieved    Target Date 03/26/20      PT SHORT TERM GOAL #2   Title Patient will report at least 50% overall improvement in subjective complaint to indicate improvement in ability to perform ADLs.    Baseline Reports 65%    Time 3    Period Weeks    Status Achieved    Target Date  03/26/20             PT Long Term Goals - 06/02/20 1339      PT LONG TERM GOAL #1   Title Patient will report at least 75% overall improvement in subjective complaint to indicate improvement in ability to perform ADLs.    Baseline Reports 65%    Time 6    Period Weeks    Status On-going      PT LONG TERM GOAL #2   Title Patient will have equal to or > 4/5 MMT throughout BLEs (except LT ankle DF) to improve ability to perform functional mobility, stair ambulation and ADLs.    Baseline See MMT    Time 6    Period Weeks    Status Partially Met      PT LONG TERM GOAL #3   Title Patient will be able to ambulate at least 425 feet during with LRAD to demonstrate improved ability to perform functional mobility and associated tasks.    Baseline Current 400 feet with straight cane    Time 6    Period Weeks    Status On-going      PT LONG TERM GOAL #4   Title Patient will be able to maintain semi tandem stance >30 seconds on BLEs to improve stability and reduce risk for falls    Baseline Can hold 30 sec bilaterally in staggered stance    Time 6    Period Weeks    Status Partially Met                 Plan - 06/11/20 1329    Clinical Impression Statement Patient tolerated session well today. Patient shows improved dynamic balance when focused on task, but tends to falter with LLE coordination when distracted or conversing. Patient made aware of this. Encouraged mindful and deliberate foot placement with dynamic tasks in pool. Patient showed improved coordination with foot crossovers. Progressed to sit to stands from pool step, patient did well with this but required verbal cues to avoid LT knee valgus. Patient also did well with step ups on LLE using single hand rail with verbal cues for sequencing.    Examination-Activity Limitations Stand;Lift;Locomotion Level;Transfers;Carry;Squat;Stairs    Examination-Participation Restrictions Yard Work;Cleaning;Community  Activity;Laundry;Volunteer    Stability/Clinical Decision Making Stable/Uncomplicated    Rehab Potential Good    PT Frequency 2x / week    PT Duration  4 weeks    PT Treatment/Interventions ADLs/Self Care Home Management;Aquatic Therapy;Biofeedback;Cryotherapy;Electrical Stimulation;Contrast Bath;Therapeutic exercise;Orthotic Fit/Training;Patient/family education;Manual lymph drainage;Compression bandaging;Taping;Vasopneumatic Device;Splinting;Joint Manipulations;Spinal Manipulations;Energy conservation;Dry needling;Passive range of motion;Manual techniques;Neuromuscular re-education;Balance training;DME Instruction;Gait training;Radiographer, therapeutic;Iontophoresis 4mg /ml Dexamethasone;Moist Heat;Traction;Functional mobility training;Ultrasound;Parrafin;Fluidtherapy;Therapeutic activities    PT Next Visit Plan Progress functional strength, gait and balance as tolerated. Continue strengthening with machines    PT Home Exercise Plan 5/18: STS, standing hip abduction and staggered tandem stance with UE support. 03/30/20: standing HS curls with support; 6/10: bridge 04/08/20:  seated toe raises, sidestepping at counter    Consulted and Agree with Plan of Care Patient           Patient will benefit from skilled therapeutic intervention in order to improve the following deficits and impairments:  Abnormal gait, Impaired sensation, Decreased activity tolerance, Decreased endurance, Decreased strength, Decreased balance, Difficulty walking, Decreased mobility  Visit Diagnosis: Muscle weakness (generalized)  Other abnormalities of gait and mobility     Problem List Patient Active Problem List   Diagnosis Date Noted  . CKD (chronic kidney disease), stage IV ([place]) 12/21/2019  . Sleep disturbance   . Anemia of chronic disease   . Dependent on hemodialysis ([place])   . Dysphagia, post-stroke   . Pressure injury of sacral region, unstageable ([place])   . Oropharyngeal dysphagia  10/03/2019  . Debility 10/03/2019  . Acute blood loss anemia 09/06/2019  . H. pylori infection 09/06/2019  . CVA (cerebral vascular accident) ([place]) 09/06/2019  . Anoxic brain injury ([place]) 09/06/2019  . MSSA bacteremia 09/06/2019  . Positive D dimer 09/06/2019  . Pseudomonas infection 09/06/2019  . Infection due to acinetobacter baumannii 09/06/2019  . Sinus tachycardia 09/05/2019  . Advanced care planning/counseling discussion   . Goals of care, counseling/discussion   . Palliative care by specialist   . Cerebral thrombosis with cerebral infarction 08/28/2019  . Tracheostomy status ([place])   . Acute respiratory distress syndrome (ARDS) due to COVID-19 virus ([place])   . Chest tube in place   . Primary spontaneous pneumothorax   . Acute respiratory failure ([place])   . Gastric ulcer with hemorrhage 08/10/2019  . Constipation 08/09/2019  . Atrial fibrillation with RVR ([place]) 08/07/2019  . Acute renal failure (ARF) ([place]) 08/06/2019  . GI bleed 08/06/2019  . Pneumothorax on right   . Fluid overload 07/27/2019  . Pneumonia due to COVID-19 virus 07/25/2019  . Acute respiratory failure due to COVID-19 ([place]) 07/25/2019  . AKI (acute kidney injury) ([place]) 07/25/2019  . Dyspepsia 11/26/2014  . BPH (benign prostatic hyperplasia) 10/31/2013  . Achilles rupture 04/05/2011  . S/P Achilles tendon repair 04/05/2011    1:36 PM, 06/11/20 Josue Hector PT DPT  Physical Therapist with [facility]  [phone] [place] [address] [place], [place], [address] Phone: [phone]   Fax:  [phone]  Name: [name] MRN: [id number] Date of Birth: 1951-01-29

## 2020-06-21 ENCOUNTER — Telehealth (HOSPITAL_COMMUNITY): Payer: Self-pay | Admitting: Physical Therapy

## 2020-06-21 NOTE — Telephone Encounter (Signed)
Pt has other apptmnets and will not be here 8/31 and 9/2

## 2020-06-22 ENCOUNTER — Encounter (HOSPITAL_COMMUNITY): Payer: Medicare HMO | Admitting: Physical Therapy

## 2020-06-24 ENCOUNTER — Ambulatory Visit (HOSPITAL_COMMUNITY): Payer: Medicare HMO | Admitting: Physical Therapy

## 2020-06-29 ENCOUNTER — Telehealth: Payer: Self-pay | Admitting: Family Medicine

## 2020-06-29 ENCOUNTER — Encounter: Payer: Self-pay | Admitting: Family Medicine

## 2020-06-29 ENCOUNTER — Telehealth (HOSPITAL_COMMUNITY): Payer: Self-pay | Admitting: Physical Therapy

## 2020-06-29 ENCOUNTER — Encounter (HOSPITAL_COMMUNITY): Payer: Medicare HMO | Admitting: Physical Therapy

## 2020-06-29 NOTE — Telephone Encounter (Signed)
pt cancelled appts for this week

## 2020-06-29 NOTE — Telephone Encounter (Signed)
Pt has season passes to [employer] in order for him to get into stadium with his cane he needs a letter from the doctor stating that he will need to have a cane.   Pt call back [phone]

## 2020-06-29 NOTE — Telephone Encounter (Signed)
Letter was dictated please make it available to the patient thank you

## 2020-07-01 ENCOUNTER — Ambulatory Visit (HOSPITAL_COMMUNITY): Payer: Medicare HMO | Admitting: Physical Therapy

## 2020-07-04 DIAGNOSIS — U071 COVID-19: Secondary | ICD-10-CM | POA: Diagnosis not present

## 2020-07-04 DIAGNOSIS — M6281 Muscle weakness (generalized): Secondary | ICD-10-CM | POA: Diagnosis not present

## 2020-07-04 DIAGNOSIS — L89153 Pressure ulcer of sacral region, stage 3: Secondary | ICD-10-CM | POA: Diagnosis not present

## 2020-07-05 ENCOUNTER — Telehealth (HOSPITAL_COMMUNITY): Payer: Self-pay | Admitting: Physical Therapy

## 2020-07-05 NOTE — Telephone Encounter (Signed)
pt cancelled appts for this week

## 2020-07-06 ENCOUNTER — Encounter (HOSPITAL_COMMUNITY): Payer: Medicare HMO | Admitting: Physical Therapy

## 2020-07-08 ENCOUNTER — Ambulatory Visit (HOSPITAL_COMMUNITY): Payer: Medicare HMO | Admitting: Physical Therapy

## 2020-07-12 ENCOUNTER — Telehealth (HOSPITAL_COMMUNITY): Payer: Self-pay | Admitting: Physical Therapy

## 2020-07-12 NOTE — Telephone Encounter (Signed)
He wants to go to the [facility] and work on things himself - he will call us back in 7-10 to let us know if he wants to r/s for be d/c

## 2020-07-13 ENCOUNTER — Encounter (HOSPITAL_COMMUNITY): Payer: Medicare HMO | Admitting: Physical Therapy

## 2020-08-03 DIAGNOSIS — U071 COVID-19: Secondary | ICD-10-CM | POA: Diagnosis not present

## 2020-08-03 DIAGNOSIS — M6281 Muscle weakness (generalized): Secondary | ICD-10-CM | POA: Diagnosis not present

## 2020-08-03 DIAGNOSIS — L89153 Pressure ulcer of sacral region, stage 3: Secondary | ICD-10-CM | POA: Diagnosis not present

## 2020-09-03 DIAGNOSIS — U071 COVID-19: Secondary | ICD-10-CM | POA: Diagnosis not present

## 2020-09-03 DIAGNOSIS — M6281 Muscle weakness (generalized): Secondary | ICD-10-CM | POA: Diagnosis not present

## 2020-09-03 DIAGNOSIS — L89153 Pressure ulcer of sacral region, stage 3: Secondary | ICD-10-CM | POA: Diagnosis not present

## 2020-09-08 DIAGNOSIS — H5200 Hypermetropia, unspecified eye: Secondary | ICD-10-CM | POA: Diagnosis not present

## 2020-09-22 ENCOUNTER — Ambulatory Visit: Payer: Medicare HMO | Admitting: Adult Health

## 2020-09-22 ENCOUNTER — Encounter: Payer: Self-pay | Admitting: Adult Health

## 2020-09-22 VITALS — BP 140/76 | HR 76 | Ht 70.5 in | Wt 166.0 lb

## 2020-09-22 DIAGNOSIS — I69398 Other sequelae of cerebral infarction: Secondary | ICD-10-CM

## 2020-09-22 DIAGNOSIS — I639 Cerebral infarction, unspecified: Secondary | ICD-10-CM

## 2020-09-22 DIAGNOSIS — I10 Essential (primary) hypertension: Secondary | ICD-10-CM | POA: Diagnosis not present

## 2020-09-22 DIAGNOSIS — R269 Unspecified abnormalities of gait and mobility: Secondary | ICD-10-CM

## 2020-09-22 DIAGNOSIS — E785 Hyperlipidemia, unspecified: Secondary | ICD-10-CM

## 2020-09-22 NOTE — Patient Instructions (Signed)
Continue to do exercises at home - you are doing great! Keep up the good work!!!  Continue aspirin 81 mg daily  and atorvastatin  for secondary stroke prevention  Continue to follow up with PCP regarding cholesterol and blood pressure management  Maintain strict control of hypertension with blood pressure goal below 130/90 and cholesterol with LDL cholesterol (bad cholesterol) goal below 70 mg/dL.      Followup in the future with me in 6 months or call earlier if needed     Thank you for coming to see [place] at [facility]. I hope we have been able to provide you high quality care today.  You may receive a patient satisfaction survey over the next few weeks. We would appreciate your feedback and comments so that we may continue to improve ourselves and the health of our patients.

## 2020-09-22 NOTE — Addendum Note (Signed)
Addended by: Mal Misty on: 09/22/2020 05:12 PM   Modules accepted: Orders

## 2020-09-22 NOTE — Progress Notes (Signed)
[facility] [address] [place]. [place] [address] [phone]       STROKE FOLLOW UP NOTE  [name] Date of Birth:  05-13-1951 Medical Record Number:  [id number]   Reason for Referral: stroke follow up    CHIEF COMPLAINT:  Chief Complaint  Patient presents with  . Follow-up  . Cerebrovascular Accident    pt said he is doing better since his last visit.    HPI:   Today, 09/22/2020, [name] returns for 44-month stroke follow-up accompanied by his wife.  Doing well since prior visit with residual mild left hemiparesis and imbalance with continued improvement.  Occasional use of cane but able to walk short distance without AD and use of AFO brace without any recent falls.  Previously completed PT but continues to do exercises at home as instructed.  Denies new stroke/TIA symptoms.  Remains on aspirin 81 mg daily and atorvastatin for secondary stroke prevention without side effects.  Blood pressure today 140/76.  No further concerns at this time.   History provided for reference purposes only Update 03/23/2020 JM: [name] returns for follow-up regarding bilateral cerebral cortex and right cerebellum stroke in 08/2019.  He is accompanied by his wife.  Residual deficits of left hemiparesis and imbalance/gait impairment but endorses ongoing improvement.  Currently ambulating with a walking stick.  Recently started working with outpatient PT at [facility].  Complaints of intermittent episodes of left facial twitching lasting several minutes and then subsiding which started approximately 1 month ago.  Wife states "it almost sounded like a hiccup".  Not associated with any other symptoms such as loss of consciousness, altered mental status, fatigue, numbness/tingling, additional weakness, speech difficulty, headache or visual concerns.  He did have follow-up with PCP on 02/27/2020 who initiated Valium 2.5 mg twice daily as needed.  He has only had 1 additional episode  since PCP visit with benefit of Valium. Repeat CT head showed resolution of traumatic SDH therefore recommended restarting aspirin 81 mg daily for secondary stroke prevention.  Remains on aspirin and atorvastatin for secondary stroke prevention.  Blood pressure today 134/82.  No concerns at this time.  Initial visit 12/10/2019 JM: [name] is a 69 year old male who is being seen today for stroke follow-up accompanied by wife. Residual deficits of left hemiparesis with LUE action tremors but endorses ongoing improvment.  He continues to work with home health therapies.  Ambulate short distance with rolling walker during therapy sessions otherwise transfers via wheelchair.  He unfortunately did have a fall on 11/27/2019 as he attempted to ambulate without assistance which led to subdural hematoma.  He has been doing well since that time and has not had any additional falls.  He does complain of mild headache since that time but are not debilitating.  He has not restarted aspirin since that time as it was recommended to discontinue with SDH.  For unknown reason, he is not currently on atorvastatin as wife was not aware he needed to be on this medication.  Blood pressure today 144/88.  Continues to follow closely with vascular surgery and does have evaluation by cardiology on 3/10.  No further stroke concerns at this time.  Stroke admission 07/25/19: [name] is a 69 y.o. male with history of heartburn who was dx with COVID on 07/13/2019 presented to [facility] on 07/25/2019 for medical issues including VDRF, R pneumothorax, AKI requiring CRRT transition to HD, encephalopathy, MSSA bacteremia with negative echocardiogram, long-term intubation requiring tracheostomy  on 08/22/2019 and episodes of A. fib with RVR treated with amiodarone.  Neurology service consulted on 08/27/2019 for altered mental status with MRI showing small acute/early subacute infarction bilateral cerebral cortex and right cerebellum  and transferred to [facility] for further medical management.  Stroke etiology likely due to hypotension secondary to dialysis and sepsis with deconditioning as well as PAF possible etiology.  In addition to acute infarcts, MRI also showed bilateral frontal gyrus ribboning R>L likely due to hypoxia and hypotension consistent with anoxic brain injury.  LE venous Doppler and 2D echo unremarkable.  EEG diffuse encephalopathy but no seizure.  LDL 149 and initiated atorvastatin 40 mg daily.  A1c 6.1. initiated aspirin 325mg  daily and eventually decreased to 81mg  daily.  Complications of GI bleed with gastric ulcer status post injection and clipping therefore unable to fully anticoagulate with new onset atrial fibrillation with RVR.  Other stroke risk factors include advanced age and EtOH use with no prior history of stroke.  Other active problems include AKI/CKD stage III, right pneumothorax/necrotic RLL not deemed to be a surgical candidate and pressure injuries to lip and buttocks. CRRT converted to HD on 11/3 with last HD on 12/24 with HD catheter removed and underwent left UE first stage basilic av fistula creation. Trach tube decannulated on 10/30. Advanced to regular diet no longer requiring TF.  Therapies recommended discharge to SNF but ultimately discharged to CIR.  He greatly improved during inpatient rehab and ultimately discharged home in stable condition on 11/07/2019.       ROS:   14 system review of systems performed and negative with exception of weakness, gait impairment  PMH:  Past Medical History:  Diagnosis Date  . CKD (chronic kidney disease), stage IV ([place]) 12/21/2019   Followed by [facility].  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 COVID-19   . Heartburn     PSH:  Past Surgical History:  Procedure Laterality Date  . ACHILLES TENDON SURGERY Right 2012  . [place] TRANSPOSITION  10/13/2019   Procedure: Basilic  Vein Transposition Left Arm;  Surgeon: Angelia Mould, MD;  Location: [facility] OR;  Service: Vascular;;  . COLONOSCOPY  2009   IH  . ESOPHAGOGASTRODUODENOSCOPY N/A 12/25/2014   Procedure: ESOPHAGOGASTRODUODENOSCOPY (EGD);  Surgeon: Danie Binder, MD;  Location: [facility] SUITE;  Service: Endoscopy;  Laterality: N/A;  830am  . ESOPHAGOGASTRODUODENOSCOPY N/A 08/09/2019   Procedure: ESOPHAGOGASTRODUODENOSCOPY (EGD);  Surgeon: Ronald Lobo, MD;  Location: [facility];  Service: Endoscopy;  Laterality: N/A;  Patient is already sedated, so I do not anticipate need for further sedation.  Okay from my standpoint to do either at the bedside or in the operating room  . HEMOSTASIS CLIP PLACEMENT  08/09/2019   Procedure: HEMOSTASIS CLIP PLACEMENT;  Surgeon: Ronald Lobo, MD;  Location: [facility] ENDOSCOPY;  Service: Endoscopy;;  . HEMOSTASIS CONTROL  08/09/2019   Procedure: HEMOSTASIS CONTROL;  Surgeon: Ronald Lobo, MD;  Location: [facility] ENDOSCOPY;  Service: Endoscopy;;  epi   . IR FLUORO GUIDE CV LINE LEFT  09/02/2019  . IR REMOVAL TUN CV CATH W/O FL  10/29/2019  . IR US GUIDE VASC ACCESS LEFT  09/02/2019  . KNEE SURGERY Left 1980's  . none    . WRIST SURGERY Left 1970's    Social History:  Social History   Socioeconomic History  . Marital status: Married    Spouse name: Not on file  . Number of  children: Not on file  . Years of education: bachelors  . Highest education level: Not on file  Occupational History  . Occupation: Scientist, clinical (histocompatibility and immunogenetics): [employer]  Tobacco Use  . Smoking status: Never Smoker  . Smokeless tobacco: Never Used  Vaping Use  . Vaping Use: Never used  Substance and Sexual Activity  . Alcohol use: Yes    Alcohol/week: 2.0 standard drinks    Types: 2 Shots of liquor per week    Comment: moderate  . Drug use: No  . Sexual activity: Not on file  Other Topics Concern  . Not on file  Social History Narrative  . Not on file   Social Determinants of Health   Financial  Resource Strain:   . Difficulty of Paying Living Expenses: Not on file  Food Insecurity:   . Worried About Charity fundraiser in the Last Year: Not on file  . Ran Out of Food in the Last Year: Not on file  Transportation Needs:   . Lack of Transportation (Medical): Not on file  . Lack of Transportation (Non-Medical): Not on file  Physical Activity:   . Days of Exercise per Week: Not on file  . Minutes of Exercise per Session: Not on file  Stress:   . Feeling of Stress : Not on file  Social Connections:   . Frequency of Communication with Friends and Family: Not on file  . Frequency of Social Gatherings with Friends and Family: Not on file  . Attends Religious Services: Not on file  . Active Member of Clubs or Organizations: Not on file  . Attends Archivist Meetings: Not on file  . Marital Status: Not on file  Intimate Partner Violence:   . Fear of Current or Ex-Partner: Not on file  . Emotionally Abused: Not on file  . Physically Abused: Not on file  . Sexually Abused: Not on file    Family History:  Family History  Problem Relation Age of Onset  . Colon cancer Neg Hx   . Colon polyps Neg Hx     Medications:   Current Outpatient Medications on File Prior to Visit  Medication Sig Dispense Refill  . aspirin EC 81 MG tablet Take 1 tablet (81 mg total) by mouth daily.    Marland Kitchen atorvastatin (LIPITOR) 40 MG tablet TAKE 1 TABLET BY MOUTH ONCE DAILY AT  6  PM 90 tablet 0  . diazepam (VALIUM) 5 MG tablet 1/2 to 1 bid prn for excessive muscle twitch 24 tablet 1  . diclofenac Sodium (VOLTAREN) 1 % GEL Apply 2 g topically 4 (four) times daily. 2 g 0  . Nutritional Supplements (FEEDING SUPPLEMENT, NEPRO CARB STEADY,) LIQD Take 237 mLs by mouth 3 (three) times daily between meals. 1000 mL 0   No current facility-administered medications on file prior to visit.    Allergies:   Allergies  Allergen Reactions  . Hydrocodone-Acetaminophen     Disorientation/confusion      Physical Exam  Vitals:   09/22/20 1239  BP: 140/76  Pulse: 76  Weight: 166 lb (75.3 kg)  Height: 5' 10.5" (1.791 m)   Body mass index is 23.48 kg/m. No exam data present  General: well developed, well nourished, pleasant middle-aged African-American male, seated, in no evident distress Head: head normocephalic and atraumatic.   Neck: supple with no carotid or supraclavicular bruits Cardiovascular: regular rate and rhythm, no murmurs Musculoskeletal: no deformity Skin:  no rash/petichiae  Neurologic Exam Mental  Status: Awake and fully alert.   Fluent speech and language.  Oriented to place and time. Recent and remote memory intact. Attention span, concentration and fund of knowledge appropriate. Mood and affect appropriate.  Cranial Nerves: Fundoscopic exam reveals sharp disc margins. Pupils equal, briskly reactive to light. Extraocular movements full without nystagmus. Visual fields full to confrontation. Hearing intact. Facial sensation intact. Face, tongue, palate moves normally and symmetrically.  Motor:  LUE: 5/5 with mildly positive pronator drift and decreased left hand dexterity LLE: 5/5 HF, KE, KF, 3/5 AD Full strength right upper and lower extremity  Sensory.:  Decreased light touch sensation left upper and lower extremity Coordination: Rapid alternating movements normal in all extremities except decreased left hand.  Finger-to-nose and heel-to-shin performed accurately on right side and dysmetria on left side.  Orbits right arm over left arm. Gait and Station: Stands from seated position without difficulty.  Steppage gait with occasional diminished left step height and use of cane.  Mild unsteadiness with turns. Reflexes: 1+ and symmetric. Toes downgoing.       ASSESSMENT: [name] is a 69 y.o. year old male found to have right cerebellar small infarct at border zone of SCA and PICA on 08/25/2019 likely due to hypotension secondary to dialysis, sepsis and  deconditioning as well as anoxic brain injury bilateral frontal R>L likely due to hypoxia and hypotension during prolonged hospitalization with [id number]-[id number] complications from 00/1/74-94/49/67.  Also sustained a subdural hematoma post fall on 11/27/2019.  Vascular risk factors include new onset PAF during admission, CKD, HLD and HTN.      PLAN:  1. Right cerebellar stroke: a. Residual deficits: Evidence of continued improvement with only minimal left upper extremity weakness and left ankle dorsiflexion weakness with mild imbalance.  Encouraged continued exercises at home and provided additional ankle strengthening exercises for hopeful benefit b. Continue aspirin 81 mg daily and atorvastatin for secondary stroke prevention c. Discussed secondary stroke prevention measures and importance of close PCP follow-up for aggressive stroke risk factor management 2. Traumatic SDH post fall 11/2019: Resolved on repeat CT head 3. HTN: BP goal<130/90.  Stable without BP meds monitored by PCP 4. HLD: LDL goal<70. On atorvastatin 40 mg daily per PCP 5. dx of A. Fib: pt declines AC use.  Continue monitoring by cardiology    Follow up in 6 months or call earlier if needed   CC:  [facility] provider: Dr. Christene Slates, Elayne Snare, MD    I spent 30 minutes of face-to-face and non-face-to-face time with patient and wife.  This included previsit chart review, lab review, study review, order entry, electronic health record documentation, patient and wife discussion and education regarding prior stroke, residual deficits, continued exercises, importance of managing stroke risk factors and answered all other questions to patient and wife satisfaction   Frann Rider, AGNP-BC  [facility] [address] [place], [place] [address]  Phone [phone] Fax [phone] Note: This document was prepared with digital dictation and possible smart phrase technology. Any transcriptional errors that  result from this process are unintentional.

## 2020-09-23 NOTE — Progress Notes (Signed)
I agree with the above plan 

## 2020-10-03 DIAGNOSIS — M6281 Muscle weakness (generalized): Secondary | ICD-10-CM | POA: Diagnosis not present

## 2020-10-03 DIAGNOSIS — L89153 Pressure ulcer of sacral region, stage 3: Secondary | ICD-10-CM | POA: Diagnosis not present

## 2020-10-03 DIAGNOSIS — U071 COVID-19: Secondary | ICD-10-CM | POA: Diagnosis not present

## 2020-10-12 ENCOUNTER — Encounter (HOSPITAL_COMMUNITY): Payer: Self-pay | Admitting: Physical Therapy

## 2020-10-12 NOTE — Therapy (Signed)
[place], [place], [address] Phone: [phone]   Fax:  [phone]  Patient Details  Name: [name] MRN: [id number] Date of Birth: [birth year] Referring Provider:  No ref. provider found  Encounter Date: 10/12/2020  PHYSICAL THERAPY DISCHARGE SUMMARY  Visits from Start of Care: 18  Current functional level related to goals / functional outcomes: NA   Remaining deficits: NA   Education / Equipment: Patient did not return for formal DC. Patient request self DC per being pleased with current function. Patient has made good progress and DC with goals met/ partially met.  Plan: Patient agrees to discharge.  Patient goals were partially met. Patient is being discharged due to being pleased with the current functional level.  ?????        4:32 PM, 10/12/20 Josue Hector PT DPT  Physical Therapist with [facility]  [phone] [place], [place], [address] Phone: [phone]   Fax:  [phone]

## 2020-11-03 DIAGNOSIS — M6281 Muscle weakness (generalized): Secondary | ICD-10-CM | POA: Diagnosis not present

## 2020-11-03 DIAGNOSIS — U071 COVID-19: Secondary | ICD-10-CM | POA: Diagnosis not present

## 2020-11-03 DIAGNOSIS — L89153 Pressure ulcer of sacral region, stage 3: Secondary | ICD-10-CM | POA: Diagnosis not present

## 2020-12-16 ENCOUNTER — Telehealth: Payer: Self-pay | Admitting: Family Medicine

## 2020-12-16 DIAGNOSIS — N289 Disorder of kidney and ureter, unspecified: Secondary | ICD-10-CM

## 2020-12-16 NOTE — Telephone Encounter (Signed)
Please contact patient to have him set up appt per Dr.Scott. Thank you!  Lab orders placed to have done Friday.

## 2020-12-16 NOTE — Addendum Note (Signed)
Addended by: Vicente Males on: 12/16/2020 01:34 PM   Modules accepted: Orders

## 2020-12-16 NOTE — Telephone Encounter (Signed)
Nurses I spoke with patient Please do the following Please order metabolic 7 because of renal insufficiency patient will have this drawn on Friday  Please also have the front reach out to the patient to schedule him for an office visit next week with me in order to discuss his kidney function and his shunt

## 2020-12-17 DIAGNOSIS — N289 Disorder of kidney and ureter, unspecified: Secondary | ICD-10-CM | POA: Diagnosis not present

## 2020-12-18 LAB — BASIC METABOLIC PANEL
BUN/Creatinine Ratio: 12 (ref 10–24)
BUN: 29 mg/dL — ABNORMAL HIGH (ref 8–27)
CO2: 24 mmol/L (ref 20–29)
Calcium: 10.1 mg/dL (ref 8.6–10.2)
Chloride: 106 mmol/L (ref 96–106)
Creatinine, Ser: 2.36 mg/dL — ABNORMAL HIGH (ref 0.76–1.27)
GFR calc Af Amer: 32 mL/min/{1.73_m2} — ABNORMAL LOW (ref >59)
GFR calc non Af Amer: 27 mL/min/{1.73_m2} — ABNORMAL LOW (ref >59)
Glucose: 88 mg/dL (ref 65–99)
Potassium: 4.8 mmol/L (ref 3.5–5.2)
Sodium: 143 mmol/L (ref 134–144)

## 2020-12-23 ENCOUNTER — Ambulatory Visit: Payer: Medicare HMO | Admitting: Family Medicine

## 2020-12-31 ENCOUNTER — Encounter: Payer: Self-pay | Admitting: Family Medicine

## 2020-12-31 ENCOUNTER — Other Ambulatory Visit: Payer: Self-pay

## 2020-12-31 ENCOUNTER — Ambulatory Visit (INDEPENDENT_AMBULATORY_CARE_PROVIDER_SITE_OTHER): Payer: Medicare HMO | Admitting: Family Medicine

## 2020-12-31 VITALS — BP 126/72 | Ht 70.5 in | Wt 167.6 lb

## 2020-12-31 DIAGNOSIS — N1832 Chronic kidney disease, stage 3b: Secondary | ICD-10-CM

## 2020-12-31 NOTE — Progress Notes (Signed)
   Subjective:    Patient ID: [name], male    DOB: [birth year], 70 y.o.   MRN: [id number]  HPI  Patient arrives to request a referral to have his dialysis shunt removed. We did discuss his chronic kidney disease he sees a kidney specialist on a regular basis He also had a vascular doctor but decided that his kidneys have been very stable over the past several months he is interested in having the shunt removed Review of Systems Please see above    Objective:   Physical Exam Lungs clear heart regular pulse normal the shunt appears in good working condition blood pressure good control       Assessment & Plan:  Dialysis shunt-he would like to have this removed We will need to touch base with his nephrologist We will also need to touch base with vascular surgery Patient's creatinine is stable currently hopefully will stay that way for a long time coming We did discuss low protein diet as well as regular physical activity plenty of water vegetables fruits avoiding high blood pressure in avoiding sugar issues.

## 2021-01-02 ENCOUNTER — Telehealth: Payer: Self-pay | Admitting: Family Medicine

## 2021-01-02 DIAGNOSIS — N289 Disorder of kidney and ureter, unspecified: Secondary | ICD-10-CM

## 2021-01-02 DIAGNOSIS — H04203 Unspecified epiphora, bilateral lacrimal glands: Secondary | ICD-10-CM

## 2021-01-02 NOTE — Telephone Encounter (Signed)
Nurses Please connect with [facility] I would like to speak with the doctor who manages [name] [name] is interested in getting his shunt removed Recent creatinine 2.36 with GFR of 32 has been relatively constant Nonemergency phone call but please do Specialist may call my cell phone [phone]

## 2021-01-03 NOTE — Telephone Encounter (Signed)
Called [facility] and left message with his doctor. Dr. Johnney Ou to call dr scott's cell. Please let nurses know if you do not get a call

## 2021-01-05 NOTE — Telephone Encounter (Signed)
Nurses-patient with having problems with aldosterone in his arm felt.  He was interested in having it removed.  I did speak with [facility].  They told us that he could have this removed that currently right now they do not feel he is going to need dialysis.  Patient needs to be referred back to the vascular surgeon with the vascular and vein that did his surgery.  They can help contemplate whether or not to remove this shunt  He is also having excessive watering of the eyes ever since having severe Covid.  Possibility that he has tear duct obstruction.  I would recommend a consultation with ophthalmology.  Not optometry.  More than likely will need to be in [facility] referral with ophthalmology.  Please assist with both referrals.  Patient is aware that we are doing the referrals and will let us know if he has not heard anything from them over the next 2 weeks.

## 2021-01-05 NOTE — Addendum Note (Signed)
Addended by: Dairl Ponder on: 01/05/2021 11:57 AM   Modules accepted: Orders

## 2021-01-05 NOTE — Telephone Encounter (Signed)
Referral ordered in Epic. 

## 2021-01-12 ENCOUNTER — Ambulatory Visit: Payer: Medicare HMO | Admitting: Vascular Surgery

## 2021-01-12 ENCOUNTER — Encounter: Payer: Self-pay | Admitting: Vascular Surgery

## 2021-01-12 ENCOUNTER — Other Ambulatory Visit: Payer: Self-pay

## 2021-01-12 VITALS — BP 130/81 | HR 77 | Temp 98.0°F | Resp 20 | Ht 70.5 in | Wt 165.0 lb

## 2021-01-12 DIAGNOSIS — N184 Chronic kidney disease, stage 4 (severe): Secondary | ICD-10-CM | POA: Diagnosis not present

## 2021-01-12 NOTE — Progress Notes (Signed)
Patient name: [name] MRN: [id number] DOB: [birth year] Sex: male  REASON FOR VISIT:   To discuss having dialysis shunt removed.  The consult is requested by Dr. Sallee Lange.  HPI:   [name] is a pleasant 70 y.o. male who had a first stage basilic vein transposition created on 10/13/2019.  He was last seen in our office by Gerri Lins, PA.  The fistula had a good thrill but he was not scheduled for a second stage procedure as he was not nearing dialysis.  The patient comes in today stating that he has significant pain where his basilic vein transposition is on the left arm and it keeps him from sleeping at night.  He states he also gets some numbness in his arm when he sleeping although this sounds positional.  His kidney function has been stable and his GFR is actually improved some.  He is under the impression that he will not require dialysis.  Current Outpatient Medications  Medication Sig Dispense Refill  . aspirin EC 81 MG tablet Take 1 tablet (81 mg total) by mouth daily.    Marland Kitchen atorvastatin (LIPITOR) 40 MG tablet TAKE 1 TABLET BY MOUTH ONCE DAILY AT  6  PM (Patient not taking: Reported on 01/12/2021) 90 tablet 0  . diazepam (VALIUM) 5 MG tablet 1/2 to 1 bid prn for excessive muscle twitch (Patient not taking: Reported on 01/12/2021) 24 tablet 1  . diclofenac Sodium (VOLTAREN) 1 % GEL Apply 2 g topically 4 (four) times daily. (Patient not taking: Reported on 01/12/2021) 2 g 0  . Nutritional Supplements (FEEDING SUPPLEMENT, NEPRO CARB STEADY,) LIQD Take 237 mLs by mouth 3 (three) times daily between meals. (Patient not taking: Reported on 01/12/2021) 1000 mL 0   No current facility-administered medications for this visit.    REVIEW OF SYSTEMS:  [X]  denotes positive finding, [ ]  denotes negative finding Vascular    Leg swelling    Cardiac    Chest pain or chest pressure:    Shortness of breath upon exertion:    Short of breath when lying flat:    Irregular heart  rhythm:    Constitutional    Fever or chills:     PHYSICAL EXAM:   Vitals:   01/12/21 0835  BP: 130/81  Pulse: 77  Resp: 20  Temp: 98 F (36.7 C)  SpO2: 95%  Weight: 74.8 kg  Height: 5' 10.5" (1.791 m)    GENERAL: The patient is a well-nourished male, in no acute distress. The vital signs are documented above. CARDIOVASCULAR: There is a regular rate and rhythm. PULMONARY: There is good air exchange bilaterally without wheezing or rales. VASCULAR: He has a good thrill in his left basilic vein fistula. He has a palpable left radial pulse.  DATA:   LABS: His GFR on 12/17/2020 was 32.  This has been stable.  This would put him in stage III chronic kidney disease.  MEDICAL ISSUES:   STAGE III CHRONIC KIDNEY DISEASE: Patient feels fairly strongly about having his basilic vein transposition ligated because he cannot sleep.  I explained that the disadvantage to this is that if he does ultimately need dialysis we would have to start from scratch for new access.  He can either have a graft in the left arm or a fistula in the right arm potentially.  In addition there is a small chance that some of his discomfort is nerve related as the nerve lies adjacent to the vein.  However I do  think by ligating the fistula we would likely significantly improve his symptoms.  I have asked him to discuss this with his nephrologist before we consider ligating the fistula.  I suspect they will prefer that we not do this.  However, he feels quite strongly about having this done as he cannot sleep.  He will call if he elects to proceed with ligation of his left basilic vein transposition.  [name] [facility] [phone]

## 2021-03-10 ENCOUNTER — Encounter: Payer: Self-pay | Admitting: Family Medicine

## 2021-03-10 ENCOUNTER — Telehealth: Payer: Self-pay | Admitting: Family Medicine

## 2021-03-10 DIAGNOSIS — N289 Disorder of kidney and ureter, unspecified: Secondary | ICD-10-CM

## 2021-03-10 NOTE — Telephone Encounter (Signed)
Pt contacted and is aware; verbalized understanding. Referral and lab orders placed.  Danae Chen, the letter that [place] dictated is under Letters in his chart; please fax letter, previous office note and a copy of the message to [facility]. Thank you!

## 2021-03-10 NOTE — Telephone Encounter (Signed)
Nurses  Please go ahead with referral to [facility].  Patient is already established there.  Needs a visit to discuss possibility of removing portal  Also- please make sure letter which was dictated today is forwarded with this referral to [facility]-I am not sure which nephrologist he sees  Finally patient should do a metabolic 7 so that when he sees [facility] he will have up-to-date met 7. Renal insufficiency  Please notify the patient that the referral process has been initiated and for him to do the met 7  Please also make Erica aware that the letter which was dictated today is intended to be sent to his nephrologist along with previous office visit note as well as this telephone message  Thanks-Dr. Nicki Reaper

## 2021-03-10 NOTE — Addendum Note (Signed)
Addended by: Vicente Males on: 03/10/2021 01:58 PM   Modules accepted: Orders

## 2021-03-17 DIAGNOSIS — N289 Disorder of kidney and ureter, unspecified: Secondary | ICD-10-CM | POA: Diagnosis not present

## 2021-03-18 LAB — BASIC METABOLIC PANEL
BUN/Creatinine Ratio: 10 (ref 10–24)
BUN: 24 mg/dL (ref 8–27)
CO2: 23 mmol/L (ref 20–29)
Calcium: 9.6 mg/dL (ref 8.6–10.2)
Chloride: 102 mmol/L (ref 96–106)
Creatinine, Ser: 2.37 mg/dL — ABNORMAL HIGH (ref 0.76–1.27)
Glucose: 119 mg/dL — ABNORMAL HIGH (ref 65–99)
Potassium: 4.4 mmol/L (ref 3.5–5.2)
Sodium: 139 mmol/L (ref 134–144)
eGFR: 29 mL/min/{1.73_m2} — ABNORMAL LOW (ref >59)

## 2021-03-23 ENCOUNTER — Ambulatory Visit: Payer: Medicare HMO | Admitting: Adult Health

## 2021-03-25 DIAGNOSIS — N400 Enlarged prostate without lower urinary tract symptoms: Secondary | ICD-10-CM | POA: Diagnosis not present

## 2021-03-25 DIAGNOSIS — D631 Anemia in chronic kidney disease: Secondary | ICD-10-CM | POA: Diagnosis not present

## 2021-03-25 DIAGNOSIS — N184 Chronic kidney disease, stage 4 (severe): Secondary | ICD-10-CM | POA: Diagnosis not present

## 2021-03-25 DIAGNOSIS — I770 Arteriovenous fistula, acquired: Secondary | ICD-10-CM | POA: Diagnosis not present

## 2021-03-28 ENCOUNTER — Telehealth: Payer: Self-pay

## 2021-03-28 NOTE — Telephone Encounter (Signed)
Dr. Johnney Ou from [facility] called to let us know she is faxing over pt's documentation regarding his desire to proceed with AVF ligation. He would like this done at [facility]. We will work on getting this scheduled once it is received.

## 2021-03-30 ENCOUNTER — Other Ambulatory Visit: Payer: Self-pay

## 2021-04-12 ENCOUNTER — Ambulatory Visit: Payer: Medicare HMO | Admitting: Adult Health

## 2021-04-18 ENCOUNTER — Encounter (HOSPITAL_COMMUNITY)
Admission: RE | Admit: 2021-04-18 | Discharge: 2021-04-18 | Disposition: A | Discharge status: Home / Self Care | Payer: Medicare HMO | Source: Ambulatory Visit | Attending: Vascular Surgery | Admitting: Vascular Surgery

## 2021-04-18 ENCOUNTER — Encounter (HOSPITAL_COMMUNITY): Payer: Self-pay

## 2021-04-18 ENCOUNTER — Other Ambulatory Visit: Payer: Self-pay

## 2021-04-21 ENCOUNTER — Encounter (HOSPITAL_COMMUNITY)
Admission: RE | Disposition: A | Discharge status: Home / Self Care | Payer: Self-pay | Source: Home / Self Care | Attending: Vascular Surgery

## 2021-04-21 ENCOUNTER — Ambulatory Visit (HOSPITAL_COMMUNITY)
Admission: RE | Admit: 2021-04-21 | Discharge: 2021-04-21 | Disposition: A | Discharge status: Home / Self Care | Payer: Medicare HMO | Attending: Vascular Surgery | Admitting: Vascular Surgery

## 2021-04-21 ENCOUNTER — Ambulatory Visit (HOSPITAL_COMMUNITY): Payer: Medicare HMO | Admitting: Certified Registered"

## 2021-04-21 ENCOUNTER — Encounter (HOSPITAL_COMMUNITY): Payer: Self-pay | Admitting: Vascular Surgery

## 2021-04-21 DIAGNOSIS — Y832 Surgical operation with anastomosis, bypass or graft as the cause of abnormal reaction of the patient, or of later complication, without mention of misadventure at the time of the procedure: Secondary | ICD-10-CM | POA: Diagnosis not present

## 2021-04-21 DIAGNOSIS — I129 Hypertensive chronic kidney disease with stage 1 through stage 4 chronic kidney disease, or unspecified chronic kidney disease: Secondary | ICD-10-CM | POA: Insufficient documentation

## 2021-04-21 DIAGNOSIS — T82898A Other specified complication of vascular prosthetic devices, implants and grafts, initial encounter: Secondary | ICD-10-CM | POA: Diagnosis not present

## 2021-04-21 DIAGNOSIS — Z7982 Long term (current) use of aspirin: Secondary | ICD-10-CM | POA: Diagnosis not present

## 2021-04-21 DIAGNOSIS — Y712 Prosthetic and other implants, materials and accessory cardiovascular devices associated with adverse incidents: Secondary | ICD-10-CM | POA: Insufficient documentation

## 2021-04-21 DIAGNOSIS — N1832 Chronic kidney disease, stage 3b: Secondary | ICD-10-CM

## 2021-04-21 DIAGNOSIS — N183 Chronic kidney disease, stage 3 unspecified: Secondary | ICD-10-CM | POA: Diagnosis not present

## 2021-04-21 DIAGNOSIS — T82848A Pain from vascular prosthetic devices, implants and grafts, initial encounter: Secondary | ICD-10-CM | POA: Insufficient documentation

## 2021-04-21 DIAGNOSIS — D649 Anemia, unspecified: Secondary | ICD-10-CM | POA: Diagnosis not present

## 2021-04-21 HISTORY — PX: LIGATION OF COMPETING BRANCHES OF ARTERIOVENOUS FISTULA: SHX5949

## 2021-04-21 LAB — POCT I-STAT, CHEM 8
BUN: 27 mg/dL — ABNORMAL HIGH (ref 8–23)
Calcium, Ion: 1.29 mmol/L (ref 1.15–1.40)
Chloride: 105 mmol/L (ref 98–111)
Creatinine, Ser: 2.1 mg/dL — ABNORMAL HIGH (ref 0.61–1.24)
Glucose, Bld: 92 mg/dL (ref 70–99)
HCT: 38 % — ABNORMAL LOW (ref 39.0–52.0)
Hemoglobin: 12.9 g/dL — ABNORMAL LOW (ref 13.0–17.0)
Potassium: 4.2 mmol/L (ref 3.5–5.1)
Sodium: 141 mmol/L (ref 135–145)
TCO2: 25 mmol/L (ref 22–32)

## 2021-04-21 SURGERY — LIGATION OF COMPETING BRANCHES OF ARTERIOVENOUS FISTULA
Anesthesia: General | Laterality: Left

## 2021-04-21 MED ORDER — FENTANYL CITRATE (PF) 100 MCG/2ML IJ SOLN: 25.0000 ug | INTRAMUSCULAR | Status: DC | PRN

## 2021-04-21 MED ORDER — CHLORHEXIDINE GLUCONATE 4 % EX LIQD: 60.0000 mL | Freq: Once | CUTANEOUS | Status: DC

## 2021-04-21 MED ORDER — SODIUM CHLORIDE 0.9 % IV SOLN
INTRAVENOUS | Status: DC | PRN
  Administered 2021-04-21: 500 mL

## 2021-04-21 MED ORDER — ONDANSETRON HCL 4 MG/2ML IJ SOLN
INTRAMUSCULAR | Status: AC
  Filled 2021-04-21: qty 2

## 2021-04-21 MED ORDER — PROPOFOL 500 MG/50ML IV EMUL
INTRAVENOUS | Status: DC | PRN
  Administered 2021-04-21: 50 ug/kg/min via INTRAVENOUS

## 2021-04-21 MED ORDER — DEXAMETHASONE SODIUM PHOSPHATE 10 MG/ML IJ SOLN
INTRAMUSCULAR | Status: AC
  Filled 2021-04-21: qty 1

## 2021-04-21 MED ORDER — FENTANYL CITRATE (PF) 100 MCG/2ML IJ SOLN
INTRAMUSCULAR | Status: DC | PRN
  Administered 2021-04-21 (×2): 25 ug via INTRAVENOUS

## 2021-04-21 MED ORDER — CEFAZOLIN SODIUM-DEXTROSE 2-4 GM/100ML-% IV SOLN
2.0000 g | INTRAVENOUS | Status: AC
  Administered 2021-04-21: 2 g via INTRAVENOUS

## 2021-04-21 MED ORDER — ONDANSETRON HCL 4 MG/2ML IJ SOLN
INTRAMUSCULAR | Status: DC | PRN
  Administered 2021-04-21: 4 mg via INTRAVENOUS

## 2021-04-21 MED ORDER — MIDAZOLAM HCL 2 MG/2ML IJ SOLN
INTRAMUSCULAR | Status: AC
  Filled 2021-04-21: qty 2

## 2021-04-21 MED ORDER — LIDOCAINE HCL (PF) 2 % IJ SOLN
INTRAMUSCULAR | Status: AC
  Filled 2021-04-21: qty 5

## 2021-04-21 MED ORDER — BUPIVACAINE-EPINEPHRINE (PF) 0.5% -1:200000 IJ SOLN
INTRAMUSCULAR | Status: DC | PRN
  Administered 2021-04-21: 10 mL

## 2021-04-21 MED ORDER — FENTANYL CITRATE (PF) 100 MCG/2ML IJ SOLN
INTRAMUSCULAR | Status: AC
  Filled 2021-04-21: qty 2

## 2021-04-21 MED ORDER — SODIUM CHLORIDE 0.9 % IV SOLN: INTRAVENOUS | Status: DC

## 2021-04-21 MED ORDER — LACTATED RINGERS IV SOLN: INTRAVENOUS | Status: DC

## 2021-04-21 MED ORDER — ONDANSETRON HCL 4 MG/2ML IJ SOLN: 4.0000 mg | Freq: Once | INTRAMUSCULAR | Status: DC | PRN

## 2021-04-21 MED ORDER — VASOPRESSIN 20 UNIT/ML IV SOLN
INTRAVENOUS | Status: AC
  Filled 2021-04-21: qty 1

## 2021-04-21 MED ORDER — ORAL CARE MOUTH RINSE: 15.0000 mL | Freq: Once | OROMUCOSAL | Status: DC

## 2021-04-21 MED ORDER — PROPOFOL 10 MG/ML IV BOLUS
INTRAVENOUS | Status: DC | PRN
  Administered 2021-04-21 (×2): 20 mg via INTRAVENOUS

## 2021-04-21 MED ORDER — PHENYLEPHRINE HCL-NACL 10-0.9 MG/250ML-% IV SOLN
INTRAVENOUS | Status: AC
  Filled 2021-04-21: qty 250

## 2021-04-21 MED ORDER — CEFAZOLIN SODIUM-DEXTROSE 2-4 GM/100ML-% IV SOLN
INTRAVENOUS | Status: AC
  Filled 2021-04-21: qty 100

## 2021-04-21 MED ORDER — HEPARIN SODIUM (PORCINE) 1000 UNIT/ML IJ SOLN
INTRAMUSCULAR | Status: AC
  Filled 2021-04-21: qty 6

## 2021-04-21 MED ORDER — PROPOFOL 10 MG/ML IV BOLUS
INTRAVENOUS | Status: AC
  Filled 2021-04-21: qty 40

## 2021-04-21 MED ORDER — CHLORHEXIDINE GLUCONATE 0.12 % MT SOLN: 15.0000 mL | Freq: Once | OROMUCOSAL | Status: DC

## 2021-04-21 MED ORDER — LIDOCAINE 2% (20 MG/ML) 5 ML SYRINGE
INTRAMUSCULAR | Status: DC | PRN
  Administered 2021-04-21: 50 mg via INTRAVENOUS

## 2021-04-21 MED ORDER — DEXAMETHASONE SODIUM PHOSPHATE 4 MG/ML IJ SOLN
INTRAMUSCULAR | Status: DC | PRN
  Administered 2021-04-21: 4 mg via INTRAVENOUS

## 2021-04-21 MED ORDER — MIDAZOLAM HCL 5 MG/5ML IJ SOLN
INTRAMUSCULAR | Status: DC | PRN
  Administered 2021-04-21 (×2): .5 mg via INTRAVENOUS

## 2021-04-21 SURGICAL SUPPLY — 28 items
ADH SKN CLS APL DERMABOND .7 (GAUZE/BANDAGES/DRESSINGS) ×1
COVER LIGHT HANDLE STERIS (MISCELLANEOUS) ×4 IMPLANT
COVER MAYO STAND XLG (MISCELLANEOUS) ×2 IMPLANT
DECANTER SPIKE VIAL GLASS SM (MISCELLANEOUS) ×2 IMPLANT
DERMABOND ADVANCED (GAUZE/BANDAGES/DRESSINGS) ×1
DERMABOND ADVANCED .7 DNX12 (GAUZE/BANDAGES/DRESSINGS) ×1 IMPLANT
ELECT REM PT RETURN 9FT ADLT (ELECTROSURGICAL) ×2
ELECTRODE REM PT RTRN 9FT ADLT (ELECTROSURGICAL) ×1 IMPLANT
GAUZE SPONGE 4X4 12PLY STRL (GAUZE/BANDAGES/DRESSINGS) ×2 IMPLANT
GLOVE SS BIOGEL STRL SZ 7.5 (GLOVE) ×1 IMPLANT
GLOVE SUPERSENSE BIOGEL SZ 7.5 (GLOVE) ×1
GLOVE SURG UNDER POLY LF SZ7 (GLOVE) ×6 IMPLANT
GOWN STRL REUS W/TWL LRG LVL3 (GOWN DISPOSABLE) ×6 IMPLANT
IV NS 500ML (IV SOLUTION) ×2
IV NS 500ML BAXH (IV SOLUTION) ×1 IMPLANT
KIT BLADEGUARD II DBL (SET/KITS/TRAYS/PACK) ×2 IMPLANT
KIT TURNOVER KIT A (KITS) ×2 IMPLANT
MANIFOLD NEPTUNE II (INSTRUMENTS) ×2 IMPLANT
NS IRRIG 1000ML POUR BTL (IV SOLUTION) ×2 IMPLANT
PACK CV ACCESS (CUSTOM PROCEDURE TRAY) ×2 IMPLANT
PAD ARMBOARD 7.5X6 YLW CONV (MISCELLANEOUS) ×4 IMPLANT
SET BASIN LINEN APH (SET/KITS/TRAYS/PACK) ×2 IMPLANT
SOL PREP POV-IOD 4OZ 10% (MISCELLANEOUS) ×2 IMPLANT
SOL PREP PROV IODINE SCRUB 4OZ (MISCELLANEOUS) ×2 IMPLANT
SPONGE T-LAP 18X18 ~~LOC~~+RFID (SPONGE) ×2 IMPLANT
SUT VIC AB 3-0 SH 27 (SUTURE) ×2
SUT VIC AB 3-0 SH 27X BRD (SUTURE) ×1 IMPLANT
UNDERPAD 30X36 HEAVY ABSORB (UNDERPADS AND DIAPERS) ×2 IMPLANT

## 2021-04-21 NOTE — Op Note (Signed)
    OPERATIVE REPORT  DATE OF SURGERY: 04/21/2021  PATIENT: [name], 70 y.o. male MRN: [id number]  DOB: 07-08-1951  PRE-OPERATIVE DIAGNOSIS: Venous hypertension left arm status post left for stage brachiobasilic fistula  POST-OPERATIVE DIAGNOSIS:  Same  PROCEDURE: Ligation of left brachiobasilic fistula  SURGEON:  Curt Jews, M.D.  PHYSICIAN ASSISTANT: Nurse  The assistant was needed for exposure and to expedite the case  ANESTHESIA: Local with sedation  EBL: per anesthesia record  Total I/O In: 500 [I.V.:400; IV Piggyback:100] Out: -   BLOOD ADMINISTERED: none  DRAINS: none  SPECIMEN: none  COUNTS CORRECT:  YES  PATIENT DISPOSITION:  PACU - hemodynamically stable  PROCEDURE DETAILS: Patient was taken operating placed supine position where the area of the left arm prepped draped in sterile fashion.  Using local anesthesia incision was made over the prior brachiobasilic fistula.  This was carried down to isolate the basilic vein near the brachial artery anastomosis.  The vein was ligated with 2-0 silk ties.  The vein was doubly ligated.  Wounds were irrigated with saline.  Hemostasis to electrocautery.  The wound was closed with 3-0 Vicryl in the subcutaneous and subcuticular tissue.  Sterile dressing was applied and the patient was transferred to the recovery room in stable condition   Rosetta Posner, M.D., [facility] 04/21/2021 8:45 AM  Note: Portions of this report may have been transcribed using voice recognition software.  Every effort has been made to ensure accuracy; however, inadvertent computerized transcription errors may still be present.

## 2021-04-21 NOTE — Discharge Instructions (Signed)
   [facility]  Discharge Instructions  AV Fistula or Graft Surgery for Dialysis Access  Please refer to the following instructions for your post-procedure care. Your surgeon or physician assistant will discuss any changes with you.  Activity  You may drive the day following your surgery, if you are comfortable and no longer taking prescription pain medication. Resume full activity as the soreness in your incision resolves.  Bathing/Showering  You may shower after you go home. Keep your incision dry for 48 hours. Do not soak in a bathtub, hot tub, or swim until the incision heals completely. You may not shower if you have a hemodialysis catheter.  Incision Care  Clean your incision with mild soap and water after 48 hours. Pat the area dry with a clean towel. You do not need a bandage unless otherwise instructed. Do not apply any ointments or creams to your incision. You may have skin glue on your incision. Do not peel it off. It will come off on its own in about one week. Your arm may swell a bit after surgery. To reduce swelling use pillows to elevate your arm so it is above your heart. Your doctor will tell you if you need to lightly wrap your arm with an ACE bandage.  Diet  Resume your normal diet. There are not special food restrictions following this procedure. In order to heal from your surgery, it is CRITICAL to get adequate nutrition. Your body requires vitamins, minerals, and protein. Vegetables are the best source of vitamins and minerals. Vegetables also provide the perfect balance of protein. Processed food has little nutritional value, so try to avoid this.  Medications  Resume taking all of your medications. If your incision is causing pain, you may take over-the counter pain relievers such as acetaminophen (Tylenol). If you were prescribed a stronger pain medication, please be aware these medications can cause nausea and constipation. Prevent  nausea by taking the medication with a snack or meal. Avoid constipation by drinking plenty of fluids and eating foods with high amount of fiber, such as fruits, vegetables, and grains.  Do not take Tylenol if you are taking prescription pain medications.  Follow up Your surgeon may want to see you in the office following your access surgery. If so, this will be arranged at the time of your surgery.  Please call us immediately for any of the following conditions:  Increased pain, redness, drainage (pus) from your incision site Fever of 101 degrees or higher Severe or worsening pain at your incision site Hand pain or numbness.  Reduce your risk of vascular disease:  Stop smoking. If you would like help, call [facility] at 1-800-QUIT-NOW 202-259-5587) or [facility] [place] your cholesterol Maintain a desired weight Control your diabetes Keep your blood pressure down  Dialysis  It will take several weeks to several months for your new dialysis access to be ready for use. Your surgeon will determine when it is okay to use it. Your nephrologist will continue to direct your dialysis. You can continue to use your Permcath until your new access is ready for use.   04/21/2021 Linley Moxley [id number] 06-05-1951  Surgeon(s): Stepfon Rawles, Arvilla Meres, MD  Procedure(s): LIGATION OF LEFT ARM ARTERIOVENOUS FISTULA    If you have any questions, please call the office at [phone].

## 2021-04-21 NOTE — H&P (Signed)
Office Visit  01/12/2021 [facility] -[facility] Stanford, Judeth Cornfield, MD   Vascular Surgery Chronic kidney disease (CKD), stage IV (severe) ([place])   Dx Follow-up; Referred by Kathyrn Drown, MD   Reason for Visit    Additional Documentation  Vitals:  BP 130/81 (BP Location: Right Arm, Patient Position: Sitting, Cuff Size: Normal)  Pulse 77  Temp 98 F (36.7 C)  Resp 20  Ht 5' 10.5" (1.791 m)  Wt 74.8 kg  SpO2 95%  BMI 23.34 kg/m  BSA 1.93 m   Flowsheets:  Clinical Intake,  Vital Signs,  NEWS,  MEWS Score,  Anthropometrics,  Method of Visit    Encounter Info:  Billing Info,  History,  Allergies,  Detailed Report     Orthostatic Vitals Recorded in This Encounter   01/12/2021  0835     Patient Position: Sitting  BP Location: Right Arm  Cuff Size: Normal   All Notes    Progress Notes by Angelia Mould, MD at 01/12/2021 8:40 AM  Author: Angelia Mould, MD Author Type: Physician Filed: 01/12/2021  8:58 AM  Note Status: Signed Cosign: Cosign Not Required Encounter Date: 01/12/2021  Editor: Angelia Mould, MD (Physician)                  Patient name: [name]          MRN: [id number]        DOB: 1951-10-20          Sex: male   REASON FOR VISIT:    To discuss having dialysis shunt removed.  The consult is requested by Dr. Sallee Lange.   HPI:    [name] is a pleasant 70 y.o. male who had a first stage basilic vein transposition created on 10/13/2019.  He was last seen in our office by Gerri Lins, PA.  The fistula had a good thrill but he was not scheduled for a second stage procedure as he was not nearing dialysis.   The patient comes in today stating that he has significant pain where his basilic vein transposition is on the left arm and it keeps him from sleeping at night.  He states he also gets some numbness in his arm when he sleeping although this sounds  positional.  His kidney function has been stable and his GFR is actually improved some.  He is under the impression that he will not require dialysis.         Current Outpatient Medications  Medication Sig Dispense Refill   aspirin EC 81 MG tablet Take 1 tablet (81 mg total) by mouth daily.       atorvastatin (LIPITOR) 40 MG tablet TAKE 1 TABLET BY MOUTH ONCE DAILY AT  6  PM (Patient not taking: Reported on 01/12/2021) 90 tablet 0   diazepam (VALIUM) 5 MG tablet 1/2 to 1 bid prn for excessive muscle twitch (Patient not taking: Reported on 01/12/2021) 24 tablet 1   diclofenac Sodium (VOLTAREN) 1 % GEL Apply 2 g topically 4 (four) times daily. (Patient not taking: Reported on 01/12/2021) 2 g 0   Nutritional Supplements (FEEDING SUPPLEMENT, NEPRO CARB STEADY,) LIQD Take 237 mLs by mouth 3 (three) times daily between meals. (Patient not taking: Reported on 01/12/2021) 1000 mL 0    No current facility-administered medications for this visit.      REVIEW OF SYSTEMS:  [X]  denotes positive finding, [ ]  denotes negative finding Vascular  Leg swelling      Cardiac      Chest pain or chest pressure:      Shortness of breath upon exertion:      Short of breath when lying flat:      Irregular heart rhythm:      Constitutional      Fever or chills:        PHYSICAL EXAM:       Vitals:    01/12/21 0835  BP: 130/81  Pulse: 77  Resp: 20  Temp: 98 F (36.7 C)  SpO2: 95%  Weight: 74.8 kg  Height: 5' 10.5" (1.791 m)      GENERAL: The patient is a well-nourished male, in no acute distress. The vital signs are documented above. CARDIOVASCULAR: There is a regular rate and rhythm. PULMONARY: There is good air exchange bilaterally without wheezing or rales. VASCULAR: He has a good thrill in his left basilic vein fistula. He has a palpable left radial pulse.   DATA:    LABS: His GFR on 12/17/2020 was 32.  This has been stable.  This would put him in stage III chronic kidney disease.   MEDICAL  ISSUES:    STAGE III CHRONIC KIDNEY DISEASE: Patient feels fairly strongly about having his basilic vein transposition ligated because he cannot sleep.  I explained that the disadvantage to this is that if he does ultimately need dialysis we would have to start from scratch for new access.  He can either have a graft in the left arm or a fistula in the right arm potentially.  In addition there is a small chance that some of his discomfort is nerve related as the nerve lies adjacent to the vein.  However I do think by ligating the fistula we would likely significantly improve his symptoms.  I have asked him to discuss this with his nephrologist before we consider ligating the fistula.  I suspect they will prefer that we not do this.  However, he feels quite strongly about having this done as he cannot sleep.  He will call if he elects to proceed with ligation of his left basilic vein transposition.   [name] [facility] [place] [phone]       Addendum:  The patient has been re-examined and re-evaluated.  The patient's history and physical has been reviewed and is unchanged.    [name] is a 70 y.o. male is being admitted with CKD. All the risks, benefits and other treatment options have been discussed with the patient. The patient has consented to proceed with Procedure(s): LIGATION OF LEFT ARM ARTERIOVENOUS FISTULA as a surgical intervention.  Curt Jews 04/21/2021 7:32 AM [facility]

## 2021-04-21 NOTE — Anesthesia Procedure Notes (Signed)
Date/Time: 04/21/2021 7:59 AM Performed by: Orlie Dakin, CRNA Pre-anesthesia Checklist: Patient identified, Emergency Drugs available, Suction available and Patient being monitored Patient Re-evaluated:Patient Re-evaluated prior to induction Oxygen Delivery Method: Nasal cannula Induction Type: IV induction Placement Confirmation: positive ETCO2

## 2021-04-21 NOTE — Anesthesia Preprocedure Evaluation (Signed)
Anesthesia Evaluation  Patient identified by MRN, date of birth, ID band Patient awake    Reviewed: Allergy & Precautions, H&P , NPO status , Patient's Chart, lab work & pertinent test results, reviewed documented beta blocker date and time   Airway Mallampati: II  TM Distance: >3 FB Neck ROM: full    Dental no notable dental hx.    Pulmonary neg pulmonary ROS,    Pulmonary exam normal breath sounds clear to auscultation       Cardiovascular Exercise Tolerance: Good negative cardio ROS   Rhythm:regular Rate:Normal     Neuro/Psych CVA negative psych ROS   GI/Hepatic Neg liver ROS, PUD,   Endo/Other  negative endocrine ROS  Renal/GU CRFRenal disease  negative genitourinary   Musculoskeletal   Abdominal   Peds  Hematology  (+) Blood dyscrasia, anemia ,   Anesthesia Other Findings   Reproductive/Obstetrics negative OB ROS                             Anesthesia Physical Anesthesia Plan  ASA: 3  Anesthesia Plan: General   Post-op Pain Management:    Induction:   PONV Risk Score and Plan: Propofol infusion  Airway Management Planned:   Additional Equipment:   Intra-op Plan:   Post-operative Plan:   Informed Consent: I have reviewed the patients History and Physical, chart, labs and discussed the procedure including the risks, benefits and alternatives for the proposed anesthesia with the patient or authorized representative who has indicated his/her understanding and acceptance.     Dental Advisory Given  Plan Discussed with: CRNA  Anesthesia Plan Comments:         Anesthesia Quick Evaluation

## 2021-04-21 NOTE — Transfer of Care (Signed)
Immediate Anesthesia Transfer of Care Note  Patient: [name]  Procedure(s) Performed: LIGATION OF LEFT ARM ARTERIOVENOUS FISTULA (Left)  Patient Location: PACU  Anesthesia Type:General  Level of Consciousness: awake, alert  and oriented  Airway & Oxygen Therapy: Patient Spontanous Breathing  Post-op Assessment: Report given to RN and Post -op Vital signs reviewed and stable  Post vital signs: Reviewed and stable  Last Vitals:  Vitals Value Taken Time  BP 137/87 04/21/21 0833  Temp    Pulse 57 04/21/21 0836  Resp 15 04/21/21 0837  SpO2 100 % 04/21/21 0836  Vitals shown include unvalidated device data.  Last Pain:  Vitals:   04/21/21 0700  TempSrc: Oral  PainSc: 0-No pain      Patients Stated Pain Goal: 8 (41/75/30 1040)  Complications: No notable events documented.

## 2021-04-21 NOTE — Anesthesia Postprocedure Evaluation (Signed)
Anesthesia Post Note  Patient: [name]  Procedure(s) Performed: LIGATION OF LEFT ARM ARTERIOVENOUS BRACHIOBASILIC FISTULA (Left)  Patient location during evaluation: Phase II Anesthesia Type: General Level of consciousness: awake Pain management: pain level controlled Vital Signs Assessment: post-procedure vital signs reviewed and stable Respiratory status: spontaneous breathing and respiratory function stable Cardiovascular status: blood pressure returned to baseline and stable Postop Assessment: no headache and no apparent nausea or vomiting Anesthetic complications: no Comments: Late entry   No notable events documented.   Last Vitals:  Vitals:   04/21/21 0900 04/21/21 0912  BP: (!) 149/93 (!) 151/88  Pulse: (!) 50 60  Resp: 12 20  Temp:  36.5 C  SpO2: 99% 98%    Last Pain:  Vitals:   04/21/21 0912  TempSrc: Oral  PainSc:                  Louann Sjogren

## 2021-04-22 ENCOUNTER — Encounter (HOSPITAL_COMMUNITY): Payer: Self-pay | Admitting: Vascular Surgery

## 2021-05-02 ENCOUNTER — Telehealth: Payer: Self-pay

## 2021-05-02 NOTE — Telephone Encounter (Signed)
Patient left VM today about his incision s/p ligation of fistula on 6/30. Says it "is not like a keloid, but not laying flush." Called patient back, no signs or symptoms of infection in the incision, just looks "raggedy". He spent several months in the hospital due to Covid. Fistula was placed and kidney function improved - fistula then ligated.  He has some concerns about his treatment course, so I offered him an appointment to discuss with MD. He will see Dr. Donnetta Hutching on 05/18/21. I also provided the phone number for hospital billing as he said the extended stay made money tight.

## 2021-05-16 ENCOUNTER — Telehealth: Payer: Self-pay | Admitting: Family Medicine

## 2021-05-16 DIAGNOSIS — Z8673 Personal history of transient ischemic attack (TIA), and cerebral infarction without residual deficits: Secondary | ICD-10-CM

## 2021-05-16 DIAGNOSIS — R498 Other voice and resonance disorders: Secondary | ICD-10-CM

## 2021-05-16 NOTE — Telephone Encounter (Signed)
Nurses Patient sent me a text regarding wanting to see ENT doctor with [facility]  He had a specific doctor in mind. Please go ahead with referral Please touch base with him to see if he knows the name of this doctor. Reason for referral weak voice History of stroke

## 2021-05-16 NOTE — Telephone Encounter (Signed)
Patient states he would like to see Dr Joya Gaskins at [facility].  Referral ordered in [facility]

## 2021-05-18 ENCOUNTER — Encounter: Payer: Medicare HMO | Admitting: Vascular Surgery

## 2021-05-25 ENCOUNTER — Other Ambulatory Visit: Payer: Self-pay

## 2021-05-25 ENCOUNTER — Ambulatory Visit (INDEPENDENT_AMBULATORY_CARE_PROVIDER_SITE_OTHER): Payer: Medicare HMO | Admitting: Vascular Surgery

## 2021-05-25 ENCOUNTER — Encounter: Payer: Self-pay | Admitting: Vascular Surgery

## 2021-05-25 VITALS — BP 136/87 | HR 68 | Temp 98.1°F | Ht 70.5 in | Wt 168.2 lb

## 2021-05-25 DIAGNOSIS — N184 Chronic kidney disease, stage 4 (severe): Secondary | ICD-10-CM

## 2021-05-25 NOTE — Progress Notes (Signed)
   [facility] [place]  Patient name: [name] MRN: [id number] DOB: 06-May-1951 Sex: male  REASON FOR VISIT: Follow-up ligation left brachiobasilic fistula on 6/94/8546  HPI: [name] is a 70 y.o. male here today for follow-up.  He had had severe COVID infection in 2020 and had renal failure associated with this.  He had dialysis via a tunneled catheter and had for stage brachiobasilic fistula creation with Dr. Scot Dock on 10/13/2019.  He had renal recovery and was seen by Dr. Scot Dock for discussion of ligation.  The patient reports that he had pain associated with the fistula particularly in the sleeping on his side.  He underwent ligation by myself as an outpatient at [facility] on 04/21/2021.  Current Outpatient Medications  Medication Sig Dispense Refill   aspirin EC 81 MG tablet Take 1 tablet (81 mg total) by mouth daily.     atorvastatin (LIPITOR) 40 MG tablet TAKE 1 TABLET BY MOUTH ONCE DAILY AT  6  PM 90 tablet 0   Cholecalciferol (VITAMIN D3 SUPER STRENGTH) 50 MCG (2000 UT) CAPS Take 2,000 Units by mouth daily.     diazepam (VALIUM) 5 MG tablet 1/2 to 1 bid prn for excessive muscle twitch (Patient taking differently: Take 2.5 mg by mouth daily as needed (excessive muscle twitch).) 24 tablet 1   diclofenac Sodium (VOLTAREN) 1 % GEL Apply 2 g topically 4 (four) times daily. 2 g 0   Nutritional Supplements (FEEDING SUPPLEMENT, NEPRO CARB STEADY,) LIQD Take 237 mLs by mouth 3 (three) times daily between meals. 1000 mL 0   No current facility-administered medications for this visit.     PHYSICAL EXAM: Vitals:   05/25/21 1137  BP: 136/87  Pulse: 68  Temp: 98.1 F (36.7 C)  TempSrc: Temporal  SpO2: 97%  Weight: 168 lb 3.2 oz (76.3 kg)  Height: 5' 10.5" (1.791 m)    GENERAL: The patient is a well-nourished male, in no acute distress. The vital signs are documented above. Incision on the medial aspect of the  antecubital space is completely healed.  There is the expected healing ridge with some thickening.  Brachial and radial pulse.  MEDICAL ISSUES: The patient reports that he is having less discomfort following ligation of his fistula.  Reports that he still has some tenderness over this area.  I explained that he should have continued resolution to some degree of this for at least 6 months and then this should stabilize.  He will see [place] again on an as-needed basis   Rosetta Posner, MD FACS [facility] Tel [phone]  Note: Portions of this report may have been transcribed using voice recognition software.  Every effort has been made to ensure accuracy; however, inadvertent computerized transcription errors may still be present.

## 2021-07-02 ENCOUNTER — Telehealth: Payer: Self-pay

## 2021-07-02 NOTE — Telephone Encounter (Signed)
Left detailed vm for patient to return call to the [place] office between regular business hours to set up his AWV appt. I left the office contact information on the vm.

## 2021-07-13 DIAGNOSIS — Z9889 Other specified postprocedural states: Secondary | ICD-10-CM | POA: Diagnosis not present

## 2021-07-13 DIAGNOSIS — J384 Edema of larynx: Secondary | ICD-10-CM | POA: Diagnosis not present

## 2021-07-13 DIAGNOSIS — J3801 Paralysis of vocal cords and larynx, unilateral: Secondary | ICD-10-CM | POA: Diagnosis not present

## 2021-07-13 DIAGNOSIS — J383 Other diseases of vocal cords: Secondary | ICD-10-CM | POA: Diagnosis not present

## 2021-07-13 DIAGNOSIS — Z886 Allergy status to analgesic agent status: Secondary | ICD-10-CM | POA: Diagnosis not present

## 2021-07-13 DIAGNOSIS — Z885 Allergy status to narcotic agent status: Secondary | ICD-10-CM | POA: Diagnosis not present

## 2021-07-13 DIAGNOSIS — Z8673 Personal history of transient ischemic attack (TIA), and cerebral infarction without residual deficits: Secondary | ICD-10-CM | POA: Diagnosis not present

## 2021-09-21 ENCOUNTER — Emergency Department (HOSPITAL_COMMUNITY)
Admission: EM | Admit: 2021-09-21 | Discharge: 2021-09-22 | Disposition: A | Discharge status: Home / Self Care | Payer: Medicare HMO | Attending: Emergency Medicine | Admitting: Emergency Medicine

## 2021-09-21 ENCOUNTER — Emergency Department (HOSPITAL_COMMUNITY): Payer: Medicare HMO

## 2021-09-21 ENCOUNTER — Other Ambulatory Visit: Payer: Self-pay

## 2021-09-21 DIAGNOSIS — R079 Chest pain, unspecified: Secondary | ICD-10-CM | POA: Diagnosis not present

## 2021-09-21 DIAGNOSIS — Z8616 Personal history of COVID-19: Secondary | ICD-10-CM | POA: Insufficient documentation

## 2021-09-21 DIAGNOSIS — R059 Cough, unspecified: Secondary | ICD-10-CM | POA: Diagnosis not present

## 2021-09-21 DIAGNOSIS — R000 Tachycardia, unspecified: Secondary | ICD-10-CM | POA: Diagnosis not present

## 2021-09-21 DIAGNOSIS — U071 COVID-19: Secondary | ICD-10-CM | POA: Diagnosis not present

## 2021-09-21 DIAGNOSIS — Z7982 Long term (current) use of aspirin: Secondary | ICD-10-CM | POA: Diagnosis not present

## 2021-09-21 DIAGNOSIS — R531 Weakness: Secondary | ICD-10-CM | POA: Diagnosis not present

## 2021-09-21 DIAGNOSIS — N184 Chronic kidney disease, stage 4 (severe): Secondary | ICD-10-CM | POA: Insufficient documentation

## 2021-09-21 DIAGNOSIS — M549 Dorsalgia, unspecified: Secondary | ICD-10-CM | POA: Diagnosis not present

## 2021-09-21 DIAGNOSIS — J101 Influenza due to other identified influenza virus with other respiratory manifestations: Secondary | ICD-10-CM | POA: Diagnosis not present

## 2021-09-21 DIAGNOSIS — I213 ST elevation (STEMI) myocardial infarction of unspecified site: Secondary | ICD-10-CM | POA: Diagnosis not present

## 2021-09-21 DIAGNOSIS — E860 Dehydration: Secondary | ICD-10-CM | POA: Diagnosis not present

## 2021-09-21 DIAGNOSIS — J111 Influenza due to unidentified influenza virus with other respiratory manifestations: Secondary | ICD-10-CM

## 2021-09-21 DIAGNOSIS — R0789 Other chest pain: Secondary | ICD-10-CM | POA: Diagnosis not present

## 2021-09-21 DIAGNOSIS — R509 Fever, unspecified: Secondary | ICD-10-CM

## 2021-09-21 DIAGNOSIS — Z20822 Contact with and (suspected) exposure to covid-19: Secondary | ICD-10-CM | POA: Diagnosis not present

## 2021-09-21 LAB — I-STAT CHEM 8, ED
BUN: 27 mg/dL — ABNORMAL HIGH (ref 8–23)
Calcium, Ion: 1.15 mmol/L (ref 1.15–1.40)
Chloride: 102 mmol/L (ref 98–111)
Creatinine, Ser: 2.8 mg/dL — ABNORMAL HIGH (ref 0.61–1.24)
Glucose, Bld: 157 mg/dL — ABNORMAL HIGH (ref 70–99)
HCT: 35 % — ABNORMAL LOW (ref 39.0–52.0)
Hemoglobin: 11.9 g/dL — ABNORMAL LOW (ref 13.0–17.0)
Potassium: 3.9 mmol/L (ref 3.5–5.1)
Sodium: 137 mmol/L (ref 135–145)
TCO2: 23 mmol/L (ref 22–32)

## 2021-09-21 LAB — LACTIC ACID, PLASMA
Lactic Acid, Venous: 1.4 mmol/L (ref 0.5–1.9)
Lactic Acid, Venous: 0.8 mmol/L (ref 0.5–1.9)

## 2021-09-21 LAB — CBC WITH DIFFERENTIAL/PLATELET
Abs Immature Granulocytes: 0.04 10*3/uL (ref 0.00–0.07)
Basophils Absolute: 0 10*3/uL (ref 0.0–0.1)
Basophils Relative: 0 %
Eosinophils Absolute: 0 10*3/uL (ref 0.0–0.5)
Eosinophils Relative: 0 %
HCT: 34.1 % — ABNORMAL LOW (ref 39.0–52.0)
Hemoglobin: 10.8 g/dL — ABNORMAL LOW (ref 13.0–17.0)
Immature Granulocytes: 0 %
Lymphocytes Relative: 2 %
Lymphs Abs: 0.2 10*3/uL — ABNORMAL LOW (ref 0.7–4.0)
MCH: 30.4 pg (ref 26.0–34.0)
MCHC: 31.7 g/dL (ref 30.0–36.0)
MCV: 96.1 fL (ref 80.0–100.0)
Monocytes Absolute: 0.5 10*3/uL (ref 0.1–1.0)
Monocytes Relative: 5 %
Neutro Abs: 10.1 10*3/uL — ABNORMAL HIGH (ref 1.7–7.7)
Neutrophils Relative %: 93 %
Platelets: 146 10*3/uL — ABNORMAL LOW (ref 150–400)
RBC: 3.55 MIL/uL — ABNORMAL LOW (ref 4.22–5.81)
RDW: 13.4 % (ref 11.5–15.5)
WBC: 10.9 10*3/uL — ABNORMAL HIGH (ref 4.0–10.5)
nRBC: 0 % (ref 0.0–0.2)

## 2021-09-21 LAB — RESP PANEL BY RT-PCR (FLU A&B, COVID) ARPGX2
Influenza A by PCR: POSITIVE — AB
Influenza B by PCR: NEGATIVE
SARS Coronavirus 2 by RT PCR: NEGATIVE

## 2021-09-21 LAB — PROTIME-INR
INR: 1.1 (ref 0.8–1.2)
Prothrombin Time: 14.1 seconds (ref 11.4–15.2)

## 2021-09-21 LAB — COMPREHENSIVE METABOLIC PANEL
ALT: 14 U/L (ref 0–44)
AST: 16 U/L (ref 15–41)
Albumin: 3.4 g/dL — ABNORMAL LOW (ref 3.5–5.0)
Alkaline Phosphatase: 34 U/L — ABNORMAL LOW (ref 38–126)
Anion gap: 7 (ref 5–15)
BUN: 27 mg/dL — ABNORMAL HIGH (ref 8–23)
CO2: 23 mmol/L (ref 22–32)
Calcium: 8.5 mg/dL — ABNORMAL LOW (ref 8.9–10.3)
Chloride: 103 mmol/L (ref 98–111)
Creatinine, Ser: 2.7 mg/dL — ABNORMAL HIGH (ref 0.61–1.24)
GFR, Estimated: 25 mL/min — ABNORMAL LOW (ref >60)
Glucose, Bld: 160 mg/dL — ABNORMAL HIGH (ref 70–99)
Potassium: 3.9 mmol/L (ref 3.5–5.1)
Sodium: 133 mmol/L — ABNORMAL LOW (ref 135–145)
Total Bilirubin: 0.5 mg/dL (ref 0.3–1.2)
Total Protein: 6.2 g/dL — ABNORMAL LOW (ref 6.5–8.1)

## 2021-09-21 LAB — TROPONIN I (HIGH SENSITIVITY)
Troponin I (High Sensitivity): 8 ng/L (ref <18)
Troponin I (High Sensitivity): 12 ng/L (ref <18)

## 2021-09-21 MED ORDER — SODIUM CHLORIDE 0.9 % IV BOLUS
500.0000 mL | Freq: Once | INTRAVENOUS | Status: AC
  Administered 2021-09-21: 500 mL via INTRAVENOUS

## 2021-09-21 MED ORDER — OSELTAMIVIR PHOSPHATE 75 MG PO CAPS: 75.0000 mg | ORAL_CAPSULE | Freq: Two times a day (BID) | ORAL | 0 refills | Status: DC

## 2021-09-21 MED ORDER — ACETAMINOPHEN 500 MG PO TABS
1000.0000 mg | ORAL_TABLET | Freq: Once | ORAL | Status: AC
  Administered 2021-09-21: 1000 mg via ORAL
  Filled 2021-09-21: qty 2

## 2021-09-21 MED ORDER — SODIUM CHLORIDE 0.9 % IV BOLUS: 1000.0000 mL | Freq: Once | INTRAVENOUS | Status: DC

## 2021-09-21 NOTE — ED Provider Notes (Signed)
Patient care assumed at 2300. Patient here for evaluation of generalized weakness, mild cough. He is positive for influenza today. Labs with stable renal insufficiency. He is feeling improved after Tylenol in the emergency department. Troponins are negative times two. Plan to discharge home with outpatient follow-up and return precautions.   No evidence of ACS or sepsis. Doubt PE.   Quintella Reichert, MD 09/22/21 [phone]

## 2021-09-21 NOTE — Discharge Instructions (Addendum)
Return for any problem.  Drink plenty of fluids.   Use tylenol as instructed for fever.   Use Tamiflu as prescribed.

## 2021-09-21 NOTE — ED Provider Notes (Signed)
[facility] EMERGENCY DEPARTMENT Provider Note   CSN: [id number] Arrival date & time: 09/21/21  2020     History Chief Complaint  Patient presents with   Weakness    [name] is a 70 y.o. male.  70 year old male with prior medical history as detailed below presents from home by EMS.  Patient complains of generalized weakness and fatigue.  Patient's symptoms began this morning.  Patient reports that over the course of the day his weakness progressed.  This afternoon he reports that he felt like he could not even get up and walk secondary to this profound generalized weakness.  He reports mild cough.  He denies chest pain.  He denies shortness of breath.  He reports sick contacts over the last week during family gatherings.  He reports prior history of COVID.  He reports that he did not get the flu vaccine this year.  His cough is nonproductive.  He denies abdominal pain.  He denies nausea or vomiting.  He denies diarrhea.  The history is provided by the patient.  Weakness Severity:  Mild Onset quality:  Gradual Duration:  1 day Timing:  Constant Progression:  Worsening Chronicity:  New Relieved by:  Nothing Worsened by:  Nothing     Past Medical History:  Diagnosis Date   CKD (chronic kidney disease), stage IV ([place]) 12/21/2019   Followed by [place] [facility].  Dr.Kruska patient had severe Covid and was on dialysis while in the hospital currently not on dialysis   COVID-19    Heartburn    Stroke [facility]) 2020    Patient Active Problem List   Diagnosis Date Noted   Stage 3b chronic kidney disease ([place]) 12/31/2020   CKD (chronic kidney disease), stage IV ([place]) 12/21/2019   Sleep disturbance    Anemia of chronic disease    Dysphagia, post-stroke    Oropharyngeal dysphagia 10/03/2019   Debility 10/03/2019   Acute blood loss anemia 09/06/2019   H. pylori infection 09/06/2019   CVA (cerebral vascular accident) ([place]) 09/06/2019   Anoxic brain injury  ([place]) 09/06/2019   MSSA bacteremia 09/06/2019   Positive D dimer 09/06/2019   Pseudomonas infection 09/06/2019   Infection due to acinetobacter baumannii 09/06/2019   Sinus tachycardia 09/05/2019   Advanced care planning/counseling discussion    Goals of care, counseling/discussion    Palliative care by specialist    Cerebral thrombosis with cerebral infarction 08/28/2019   Acute respiratory distress syndrome (ARDS) due to COVID-19 virus ([place])    Chest tube in place    Primary spontaneous pneumothorax    Acute respiratory failure ([place])    Gastric ulcer with hemorrhage 08/10/2019   Constipation 08/09/2019   Acute renal failure (ARF) ([place]) 08/06/2019   GI bleed 08/06/2019   Pneumothorax on right    Fluid overload 07/27/2019   Pneumonia due to COVID-19 virus 07/25/2019   Acute respiratory failure due to COVID-19 [facility]) 07/25/2019   AKI (acute kidney injury) ([place]) 07/25/2019   Dyspepsia 11/26/2014   BPH (benign prostatic hyperplasia) 10/31/2013   Achilles rupture 04/05/2011   S/P Achilles tendon repair 04/05/2011    Past Surgical History:  Procedure Laterality Date   ACHILLES TENDON SURGERY Right 2355   [place] TRANSPOSITION  10/13/2019   Procedure: Basilic Vein Transposition Left Arm;  Surgeon: Angelia Mould, MD;  [facility];  Service: Vascular;;   COLONOSCOPY  2009   [place]   ESOPHAGOGASTRODUODENOSCOPY N/A 12/25/2014   Procedure: ESOPHAGOGASTRODUODENOSCOPY (EGD);  Surgeon: Danie Binder,  MD;  [facility];  Service: Endoscopy;  Laterality: N/A;  830am   ESOPHAGOGASTRODUODENOSCOPY N/A 08/09/2019   Procedure: ESOPHAGOGASTRODUODENOSCOPY (EGD);  Surgeon: Ronald Lobo, MD;  Location[facility];  Service: Endoscopy;  Laterality: N/A;  Patient is already sedated, so I do not anticipate need for further sedation.  Okay from my standpoint to do either at the bedside or in the operating room   HEMOSTASIS CLIP PLACEMENT  08/09/2019   Procedure: [place];  Surgeon: Ronald Lobo, MD;  Location: [facility] ENDOSCOPY;  Service: Endoscopy;;   HEMOSTASIS CONTROL  08/09/2019   Procedure: HEMOSTASIS CONTROL;  Surgeon: Ronald Lobo, MD;  [facility] ENDOS[facility];  Service: Endoscopy;;  epi    IR FLUORO GUIDE CV LINE LEFT  09/02/2019   IR REMOVAL TUN CV CATH W/O FL  10/29/2019   IR US GUIDE VASC ACCESS LEFT  09/02/2019   KNEE SURGERY Left 1980's   LIGATION OF COMPETING BRANCHES OF ARTERIOVENOUS FISTULA Left 04/21/2021   Procedure: LIGATION OF LEFT ARM ARTERIOVENOUS BRACHIOBASILIC FISTULA;  Surgeon: Rosetta Posner, MD;  Location: [facility] ORS;  Service: Vascular;  Laterality: Left;   none     WRIST SURGERY Left 1970's       Family History  Problem Relation Age of Onset   Colon cancer Neg Hx    Colon polyps Neg Hx     Social History   Tobacco Use   Smoking status: Never   Smokeless tobacco: Never  Vaping Use   Vaping Use: Never used  Substance Use Topics   Alcohol use: Yes    Alcohol/week: 2.0 standard drinks    Types: 2 Shots of liquor per week    Comment: moderate   Drug use: No    Home Medications Prior to Admission medications   Medication Sig Start Date End Date Taking? Authorizing Provider  aspirin EC 81 MG tablet Take 1 tablet (81 mg total) by mouth daily. 03/23/20   Frann Rider, NP  atorvastatin (LIPITOR) 40 MG tablet TAKE 1 TABLET BY MOUTH ONCE DAILY AT  6  PM 05/10/20   Frann Rider, NP  Cholecalciferol (VITAMIN D3 SUPER STRENGTH) 50 MCG (2000 UT) CAPS Take 2,000 Units by mouth daily.    [provider]  diazepam (VALIUM) 5 MG tablet 1/2 to 1 bid prn for excessive muscle twitch Patient taking differently: Take 2.5 mg by mouth daily as needed (excessive muscle twitch). 02/27/20   Kathyrn Drown, MD  diclofenac Sodium (VOLTAREN) 1 % GEL Apply 2 g topically 4 (four) times daily. 11/06/19   Angiulli, Lavon Paganini, PA-C  Nutritional Supplements (FEEDING SUPPLEMENT, NEPRO CARB STEADY,) LIQD Take 237 mLs by mouth 3 (three)  times daily between meals. 11/06/19   Angiulli, Lavon Paganini, PA-C    Allergies    Hydrocodone-acetaminophen  Review of Systems   Review of Systems  Neurological:  Positive for weakness.  All other systems reviewed and are negative.  Physical Exam Updated Vital Signs BP 101/72 (BP Location: Left Arm)   Pulse (!) 117   Temp 100.1 F (37.8 C) (Oral)   Resp 16   Ht 5\' 10"  (1.778 m)   Wt 77.1 kg   SpO2 97%   BMI 24.39 kg/m   Physical Exam Vitals and nursing note reviewed.  Constitutional:      General: He is not in acute distress.    Appearance: Normal appearance. He is well-developed.  HENT:     Head: Normocephalic and atraumatic.  Eyes:  Conjunctiva/sclera: Conjunctivae normal.     Pupils: Pupils are equal, round, and reactive to light.  Cardiovascular:     Rate and Rhythm: Regular rhythm. Tachycardia present.     Heart sounds: Normal heart sounds.  Pulmonary:     Effort: Pulmonary effort is normal. No respiratory distress.     Breath sounds: Normal breath sounds.  Abdominal:     General: There is no distension.     Palpations: Abdomen is soft.     Tenderness: There is no abdominal tenderness.  Musculoskeletal:        General: No deformity. Normal range of motion.     Cervical back: Normal range of motion and neck supple.  Skin:    General: Skin is warm and dry.  Neurological:     General: No focal deficit present.     Mental Status: He is alert and oriented to person, place, and time.    ED Results / Procedures / Treatments   Labs (all labs ordered are listed, but only abnormal results are displayed) Labs Reviewed  CBC WITH DIFFERENTIAL/PLATELET - Abnormal; Notable for the following components:      Result Value   WBC 10.9 (*)    RBC 3.55 (*)    Hemoglobin 10.8 (*)    HCT 34.1 (*)    Platelets 146 (*)    Neutro Abs 10.1 (*)    Lymphs Abs 0.2 (*)    All other components within normal limits  COMPREHENSIVE METABOLIC PANEL - Abnormal; Notable for the  following components:   Sodium 133 (*)    Glucose, Bld 160 (*)    BUN 27 (*)    Creatinine, Ser 2.70 (*)    Calcium 8.5 (*)    Total Protein 6.2 (*)    Albumin 3.4 (*)    Alkaline Phosphatase 34 (*)    GFR, Estimated 25 (*)    All other components within normal limits  I-STAT CHEM 8, ED - Abnormal; Notable for the following components:   BUN 27 (*)    Creatinine, Ser 2.80 (*)    Glucose, Bld 157 (*)    Hemoglobin 11.9 (*)    HCT 35.0 (*)    All other components within normal limits  CULTURE, BLOOD (ROUTINE X 2)  CULTURE, BLOOD (ROUTINE X 2)  RESP PANEL BY RT-PCR (FLU A&B, COVID) ARPGX2  PROTIME-INR  LACTIC ACID, PLASMA  LACTIC ACID, PLASMA  URINALYSIS, ROUTINE W REFLEX MICROSCOPIC  TROPONIN I (HIGH SENSITIVITY)    EKG EKG Interpretation  Date/Time:  Wednesday September 21 2021 20:26:13 EST Ventricular Rate:  120 PR Interval:  150 QRS Duration: 150 QT Interval:  333 QTC Calculation: 471 R Axis:   -78 Text Interpretation: Sinus tachycardia RBBB and LAFB LVH by voltage Lateral leads are also involved Baseline wander in lead(s) V6 Confirmed by Dene Gentry [id number]) on 09/21/2021 8:37:11 PM  Radiology DG Chest Port 1 View  Result Date: 09/21/2021 CLINICAL DATA:  Cough EXAM: PORTABLE CHEST 1 VIEW COMPARISON:  09/16/2019 FINDINGS: No focal opacity or pleural effusion. Normal cardiomediastinal silhouette. No pneumothorax. IMPRESSION: No active disease. Electronically Signed   By: Donavan Foil M.D.   On: 09/21/2021 20:41    Procedures Procedures   Medications Ordered in ED Medications  acetaminophen (TYLENOL) tablet 1,000 mg (has no administration in time range)  sodium chloride 0.9 % bolus 500 mL (has no administration in time range)    ED Course  I have reviewed the triage vital signs and the nursing notes.  Pertinent labs & imaging results that were available during my care of the patient were reviewed by me and considered in my medical decision making (see chart  for details).    MDM Rules/Calculators/A&P                           MDM  MSE complete  [name] was evaluated in Emergency Department on 09/21/2021 for the symptoms described in the history of present illness. He was evaluated in the context of the global COVID-19 pandemic, which necessitated consideration that the patient might be at risk for infection with the SARS-CoV-2 virus that causes COVID-19. Institutional protocols and algorithms that pertain to the evaluation of patients at risk for COVID-19 are in a state of rapid change based on information released by regulatory bodies including the CDC and federal and state organizations. These policies and algorithms were followed during the patient's care in the ED.  Patient presents with complaint of generalized weakness and fatigue  Of note, patient is noted to be febrile on arrival.  Initial temp of 100.1.  Repeat temperature is 100.3 orally.  EMS activated code STEMI in the field.  Patient's EKG reviewed with Dr. Ellyn Hack of cardiology on arrival to the ED.  Code STEMI canceled.  Patient is without complaint of chest pain.  ED workup initiated. Pending dispo (likely DC) signed over to oncoming EDP.   Final Clinical Impression(s) / ED Diagnoses Final diagnoses:  Weakness  Fever, unspecified fever cause  Influenza    Rx / DC Orders ED Discharge Orders     None        Valarie Merino, MD 09/25/21 1455

## 2021-09-21 NOTE — ED Triage Notes (Signed)
Pt arrived via EMS from home. Per EMS, pt c/o generalized weakness that started while at home at approx. 1600 today. Pt denied CP, SOB, N/V. Per EMS they consulted for possible STEMI activation via sending ECG which was not activated as a code STEMI. Pt arrived to ED caox4, in no obvious distress, c/o "feeling weak all over."

## 2021-09-22 NOTE — Consult Note (Signed)
Responded to page for STEMI, pt unavailable, pls call again if further need of chaplain services.   Rev. Eloise Levels [place]

## 2021-09-26 LAB — CULTURE, BLOOD (ROUTINE X 2)
Culture: NO GROWTH
Culture: NO GROWTH
Special Requests: ADEQUATE
Special Requests: ADEQUATE

## 2021-10-27 DIAGNOSIS — H5210 Myopia, unspecified eye: Secondary | ICD-10-CM | POA: Diagnosis not present

## 2022-01-10 ENCOUNTER — Telehealth: Payer: Self-pay

## 2022-01-10 ENCOUNTER — Other Ambulatory Visit: Payer: Self-pay

## 2022-01-10 ENCOUNTER — Ambulatory Visit (INDEPENDENT_AMBULATORY_CARE_PROVIDER_SITE_OTHER): Payer: Medicare HMO

## 2022-01-10 VITALS — BP 130/82 | HR 65 | Ht 70.5 in | Wt 168.4 lb

## 2022-01-10 DIAGNOSIS — R29898 Other symptoms and signs involving the musculoskeletal system: Secondary | ICD-10-CM

## 2022-01-10 DIAGNOSIS — I639 Cerebral infarction, unspecified: Secondary | ICD-10-CM

## 2022-01-10 DIAGNOSIS — Z8673 Personal history of transient ischemic attack (TIA), and cerebral infarction without residual deficits: Secondary | ICD-10-CM | POA: Diagnosis not present

## 2022-01-10 DIAGNOSIS — Z0000 Encounter for general adult medical examination without abnormal findings: Secondary | ICD-10-CM

## 2022-01-10 DIAGNOSIS — M79671 Pain in right foot: Secondary | ICD-10-CM

## 2022-01-10 DIAGNOSIS — Z1211 Encounter for screening for malignant neoplasm of colon: Secondary | ICD-10-CM

## 2022-01-10 MED ORDER — TRIAMCINOLONE ACETONIDE 0.1 % EX CREA: 1.0000 "application " | TOPICAL_CREAM | Freq: Two times a day (BID) | CUTANEOUS | 3 refills | Status: DC

## 2022-01-10 NOTE — Telephone Encounter (Signed)
Referral ordered in Epic. Patient aware. ?

## 2022-01-10 NOTE — Progress Notes (Signed)
? ?Subjective:  ? Albert Sabedra is a 71 y.o. male who presents for an Initial Medicare Annual Wellness Visit. ? ?Review of Systems    ? ?Cardiac Risk Factors include: advanced age (>27mn, >>76women);male gender;sedentary lifestyle;Other (see comment), Risk factor comments: CVA, Drop foot. ? ?   ?Objective:  ?  ?Today's Vitals  ? 01/10/22 1016  ?BP: 130/82  ?Pulse: 65  ?SpO2: 98%  ?Weight: 168 lb 6.4 oz (76.4 kg)  ?Height: 5' 10.5" (1.791 m)  ? ?Body mass index is 23.82 kg/m?. ? ?Advanced Directives 01/10/2022 05/25/2021 04/18/2021 11/28/2019 11/27/2019 10/03/2019 10/03/2019  ?Does Patient Have a Medical Advance Directive? Yes No No No No - Yes  ?Type of [place] - - - - Out of facility DNR (pink MOST or yellow form) Out of facility DNR (pink MOST or yellow form)  ?Does patient want to make changes to medical advance directive? - - - - No - Patient declined No - Patient declined -  ?Copy of [place]? Yes - validated most recent copy scanned in chart (See row information) - - - - - -  ?Would patient like information on creating a medical advance directive? - - No - Patient declined - No - Patient declined No - Patient declined No - Patient declined  ? ? ?Current Medications (verified) ?Outpatient Encounter Medications as of 01/10/2022  ?Medication Sig  ? aspirin EC 81 MG tablet Take 1 tablet (81 mg total) by mouth daily.  ? atorvastatin (LIPITOR) 40 MG tablet TAKE 1 TABLET BY MOUTH ONCE DAILY AT  6  PM  ? Cholecalciferol (VITAMIN D3 SUPER STRENGTH) 50 MCG (2000 UT) CAPS Take 2,000 Units by mouth daily.  ? diazepam (VALIUM) 5 MG tablet 1/2 to 1 bid prn for excessive muscle twitch (Patient taking differently: Take 2.5 mg by mouth daily as needed (excessive muscle twitch).)  ? diclofenac Sodium (VOLTAREN) 1 % GEL Apply 2 g topically 4 (four) times daily.  ? Nutritional Supplements (FEEDING SUPPLEMENT, NEPRO CARB STEADY,) LIQD Take 237 mLs by mouth 3  (three) times daily between meals.  ? oseltamivir (TAMIFLU) 75 MG capsule Take 1 capsule (75 mg total) by mouth every 12 (twelve) hours.  ? ?No facility-administered encounter medications on file as of 01/10/2022.  ? ? ?Allergies (verified) ?Hydrocodone-acetaminophen  ? ?History: ?Past Medical History:  ?Diagnosis Date  ? CKD (chronic [place] disease), [place] IV [place] 12/21[place]  ? [facility]  Dr.Kruska patient had severe Covid and was on dialysis while in the hospital currently not on dialysis  ? COVID-19   ? Heartburn   ? Stroke ([facility] 2020  ? ?Past Surgical History:  ?Procedure Laterality Date  ? ACHILLES TENDON SURGERY Right 2012  ? [place]  10[place]  ? Procedure: Basilic Vein Transposition Left Arm;  Surgeon: DAngelia Mould MD;  [facility]  Service: Vascular;;  ? COLONOSCOPY  2009  ? IH  ? ESOPHAGOGASTRODUODENOSCOPY N/A 12/25/2014  ? Procedure: ESOPHAGOGASTRODUODENOSCOPY (EGD);  Surgeon: SDanie Binder MD;  [facility];  Service: Endoscopy;  Laterality: N/A;  830am  ? ESOPHAGOGASTRODUODENOSCOPY N/A 08/09/2019  ? Procedure: ESOPHAGOGASTRODUODENOSCOPY (EGD);  Surgeon: BRonald Lobo MD;  [facility];  Service: Endoscopy;  Laterality: N/A;  Patient is already sedated, so I do not anticipate need for further sedation.  Okay from my standpoint to do either at the bedside or in the operating room  ? HEMOSTASIS CLIP PLACEMENT  08/09/2019  ? Procedure: HEMOSTASIS CLIP PLACEMENT;  Surgeon: Ronald Lobo, MD;  [facility];  Service: Endoscopy;;  ? HEMOSTASIS CONTROL  08/09/2019  ? Procedure: HEMOSTASIS CONTROL;  Surgeon: Ronald Lobo, MD;  [facility];  Service: Endoscopy;;  epi ?  ? IR FLUORO GUIDE CV LINE LEFT  09/02/2019  ? IR REMOVAL TUN CV CATH W/O FL  10/29/2019  ? IR US GUIDE VASC ACCESS LEFT  09/02/2019  ? KNEE SURGERY Left 1980's  ? LIGATION OF COMPETING BRANCHES OF ARTERIOVENOUS FISTULA Left 04/21/2021  ?  Procedure: LIGATION OF LEFT ARM ARTERIOVENOUS BRACHIOBASILIC FISTULA;  Surgeon: Rosetta Posner, MD;  Location[facility] ORS;  Service: Vascular;  Laterality: Left;  ? none    ? WRIST SURGERY Left 1970's  ? ?Family History  ?Problem Relation Age of Onset  ? Colon cancer Neg Hx   ? Colon polyps Neg Hx   ? ?Social History  ? ?Socioeconomic History  ? Marital status: Married  ?  Spouse name: [name]  ? Number of children: 2  ? Years of education: bachelors  ? Highest education level: Not on file  ?Occupational History  ? Occupation: Press photographer  ?  Employer: [name]  ?Tobacco Use  ? Smoking status: Never  ? Smokeless tobacco: Never  ?Vaping Use  ? Vaping Use: Never used  ?Substance and Sexual Activity  ? Alcohol use: Yes  ?  Alcohol/week: 2.0 standard drinks  ?  Types: 2 Shots of liquor per week  ?  Comment: moderate  ? Drug use: No  ? Sexual activity: Not on file  ?Other Topics Concern  ? Not on file  ?Social History Narrative  ? Retired Barrister's clerk  ? 2 grown sons.   ? ?Social Determinants of Health  ? ?Financial Resource Strain: Low Risk   ? Difficulty of Paying Living Expenses: Not hard at all  ?Food Insecurity: No Food Insecurity  ? Worried About Charity fundraiser in the Last Year: Never true  ? Ran Out of Food in the Last Year: Never true  ?Transportation Needs: No Transportation Needs  ? Lack of Transportation (Medical): No  ? Lack of Transportation (Non-Medical): No  ?Physical Activity: Insufficiently Active  ? Days of Exercise per Week: 3 days  ? Minutes of Exercise per Session: 30 min  ?Stress: No Stress Concern Present  ? Feeling of Stress : Not at all  ?Social Connections: Socially Integrated  ? Frequency of Communication with Friends and Family: More than three times a week  ? Frequency of Social Gatherings with Friends and Family: More than three times a week  ? Attends Religious Services: More than 4 times per year  ? Active Member of Clubs or Organizations: Yes  ? Attends Archivist Meetings: More than  4 times per year  ? Marital Status: Married  ? ? ?Tobacco Counseling ?Counseling given: Not Answered ? ? ?Clinical Intake: ? ?Pre-visit preparation completed: Yes ? ?Pain : No/denies pain ? ?  ? ?BMI - recorded: 23.82 ?Nutritional Status: BMI of 19-24  Normal ?Nutritional Risks: None ?Diabetes: No ? ?How often do you need to have someone help you when you read instructions, pamphlets, or other written materials from your doctor or pharmacy?: 1 - Never ? ?Diabetic?NO ? ?Interpreter Needed?: No ? ?Information entered by :: mj Bethaney Oshana, lpn ? ? ?Activities of Daily Living ?In your present state of health, do you have any difficulty performing the following activities: 01/10/2022 04/18/2021  ?Hearing? N N  ?  Vision? N N  ?Difficulty concentrating or making decisions? Y N  ?Comment Memory -  ?Walking or climbing stairs? N Y  ?Dressing or bathing? N N  ?Doing errands, shopping? N N  ?Preparing Food and eating ? N -  ?Using the Toilet? N -  ?In the past six months, have you accidently leaked urine? Y -  ?Comment At times. -  ?Do you have problems with loss of bowel control? N -  ?Managing your Medications? N -  ?Managing your Finances? N -  ?Housekeeping or managing your Housekeeping? N -  ?Some recent data might be hidden  ? ? ?Patient Care Team: ?Kathyrn Drown, MD as PCP - General (Family Medicine) ?Fields, Marga Melnick, MD (Inactive) as Consulting Physician (Gastroenterology) ? ?Indicate any recent Medical Services you may have received from other than [facility] providers in the past year (date may be approximate). ? ?   ?Assessment:  ? This is a routine wellness examination for [place]. ? ?Hearing/Vision screen ?Hearing Screening - Comments:: No hearing issues.  ?Vision Screening - Comments:: Glasses. [facility] 2023. ? ?Dietary issues and exercise activities discussed: ?Current Exercise Habits: Home exercise routine, Type of exercise: walking, Time (Minutes): 30, Frequency (Times/Week): 3, Weekly Exercise  (Minutes/Week): 90, Intensity: Mild, Exercise limited by: cardiac condition(s);neurologic condition(s) ? ? Goals Addressed   ? ?  ?  ?  ?  ? This Visit's Progress  ?  Exercise 3x per week (30 min per time)     ?  Continu

## 2022-01-10 NOTE — Addendum Note (Signed)
Addended by: Dairl Ponder on: 01/10/2022 03:28 PM ? ? Modules accepted: Orders ? ?

## 2022-01-10 NOTE — Telephone Encounter (Signed)
May have podiatry referral ?

## 2022-01-10 NOTE — Telephone Encounter (Signed)
When [name] was here today for his appointment, he asked if [facility] would be able to help him get a "protective covering" for his shoes? I did a CRR referral to see is we could help. Tillie Rung, Case Manager, messaged me and states pt would need to be referred to a Podiatrist for that type of assistance. I spoke with patient and he is agreeable to a Podiatry referral. Is this agreeable with you for a referral to be made? Thank you! ?

## 2022-01-10 NOTE — Telephone Encounter (Signed)
Pt states that since he had Covid, he has noticed that he has increasing areas of itching to his skin and wanted to know what Dr. Wolfgang Phoenix would recommend? Thank you. ?

## 2022-01-10 NOTE — Telephone Encounter (Signed)
Foot pain

## 2022-01-10 NOTE — Telephone Encounter (Signed)
Nurses-hard to know specifically based upon this but it does seem reasonable to try triamcinolone cream 0.1% apply thin amount twice daily as needed, 30 g, 3 refills ?If ongoing troubles please follow-up ?Not to be used in the groin area or the face ?

## 2022-01-10 NOTE — Telephone Encounter (Signed)
Prescription sent electronically to pharmacy. Patient notified. 

## 2022-01-10 NOTE — Patient Instructions (Signed)
Mr. Wamble , ?Thank you for taking time to come for your Medicare Wellness Visit. I appreciate your ongoing commitment to your health goals. Please review the following plan we discussed and let me know if I can assist you in the future.  ? ?Screening recommendations/referrals: ?Colonoscopy: Order placed for FOBT today ? ?Recommended yearly ophthalmology/optometry visit for glaucoma screening and checkup ?Recommended yearly dental visit for hygiene and checkup ? ?Vaccinations: ?Influenza vaccine: Due. Repeat annually ? ?Pneumococcal vaccine: Discussed. ?Tdap vaccine: Due. Repeat in 10 years ? ?Shingles vaccine: Discussed.   ?Covid-19: Done 01/03/2020, 02/04/2020 ? ?Advanced directives: Copy scanned in chart. ? ?Conditions/risks identified: KEEP UP THE GOOD WORK!! ? ?Next appointment: Follow up in one year for your annual wellness visit. 2024. ? ?Preventive Care 28 Years and Older, Male ? ?Preventive care refers to lifestyle choices and visits with your health care provider that can promote health and wellness. ?What does preventive care include? ?A yearly physical exam. This is also called an annual well check. ?Dental exams once or twice a year. ?Routine eye exams. Ask your health care provider how often you should have your eyes checked. ?Personal lifestyle choices, including: ?Daily care of your teeth and gums. ?Regular physical activity. ?Eating a healthy diet. ?Avoiding tobacco and drug use. ?Limiting alcohol use. ?Practicing safe sex. ?Taking low doses of aspirin every day. ?Taking vitamin and mineral supplements as recommended by your health care provider. ?What happens during an annual well check? ?The services and screenings done by your health care provider during your annual well check will depend on your age, overall health, lifestyle risk factors, and family history of disease. ?Counseling  ?Your health care provider may ask you questions about your: ?Alcohol use. ?Tobacco use. ?Drug use. ?Emotional  well-being. ?Home and relationship well-being. ?Sexual activity. ?Eating habits. ?History of falls. ?Memory and ability to understand (cognition). ?Work and work Statistician. ?Screening  ?You may have the following tests or measurements: ?Height, weight, and BMI. ?Blood pressure. ?Lipid and cholesterol levels. These may be checked every 5 years, or more frequently if you are over 103 years old. ?Skin check. ?Lung cancer screening. You may have this screening every year starting at age 75 if you have a 30-pack-year history of smoking and currently smoke or have quit within the past 15 years. ?Fecal occult blood test (FOBT) of the stool. You may have this test every year starting at age 45. ?Flexible sigmoidoscopy or colonoscopy. You may have a sigmoidoscopy every 5 years or a colonoscopy every 10 years starting at age 51. ?Prostate cancer screening. Recommendations will vary depending on your family history and other risks. ?Hepatitis C blood test. ?Hepatitis B blood test. ?Sexually transmitted disease (STD) testing. ?Diabetes screening. This is done by checking your blood sugar (glucose) after you have not eaten for a while (fasting). You may have this done every 1-3 years. ?Abdominal aortic aneurysm (AAA) screening. You may need this if you are a current or former smoker. ?Osteoporosis. You may be screened starting at age 1 if you are at high risk. ?Talk with your health care provider about your test results, treatment options, and if necessary, the need for more tests. ?Vaccines  ?Your health care provider may recommend certain vaccines, such as: ?Influenza vaccine. This is recommended every year. ?Tetanus, diphtheria, and acellular pertussis (Tdap, Td) vaccine. You may need a Td booster every 10 years. ?Zoster vaccine. You may need this after age 41. ?Pneumococcal 13-valent conjugate (PCV13) vaccine. One dose is recommended after age 22. ?  Pneumococcal polysaccharide (PPSV23) vaccine. One dose is recommended after  age 81. ?Talk to your health care provider about which screenings and vaccines you need and how often you need them. ?This information is not intended to replace advice given to you by your health care provider. Make sure you discuss any questions you have with your health care provider. ?Document Released: 11/05/2015 Document Revised: 06/28/2016 Document Reviewed: 08/10/2015 ?Elsevier Interactive Patient Education ? 2017 [place]. ? ?Fall Prevention in the Home ?Falls can cause injuries. They can happen to people of all ages. There are many things you can do to make your home safe and to help prevent falls. ?What can I do on the outside of my home? ?Regularly fix the edges of walkways and driveways and fix any cracks. ?Remove anything that might make you trip as you walk through a door, such as a raised step or threshold. ?Trim any bushes or trees on the path to your home. ?Use bright outdoor lighting. ?Clear any walking paths of anything that might make someone trip, such as rocks or tools. ?Regularly check to see if handrails are loose or broken. Make sure that both sides of any steps have handrails. ?Any raised decks and porches should have guardrails on the edges. ?Have any leaves, snow, or ice cleared regularly. ?Use sand or salt on walking paths during winter. ?Clean up any spills in your garage right away. This includes oil or grease spills. ?What can I do in the bathroom? ?Use night lights. ?Install grab bars by the toilet and in the tub and shower. Do not use towel bars as grab bars. ?Use non-skid mats or decals in the tub or shower. ?If you need to sit down in the shower, use a plastic, non-slip stool. ?Keep the floor dry. Clean up any water that spills on the floor as soon as it happens. ?Remove soap buildup in the tub or shower regularly. ?Attach bath mats securely with double-sided non-slip rug tape. ?Do not have throw rugs and other things on the floor that can make you trip. ?What can I do in the  bedroom? ?Use night lights. ?Make sure that you have a light by your bed that is easy to reach. ?Do not use any sheets or blankets that are too big for your bed. They should not hang down onto the floor. ?Have a firm chair that has side arms. You can use this for support while you get dressed. ?Do not have throw rugs and other things on the floor that can make you trip. ?What can I do in the kitchen? ?Clean up any spills right away. ?Avoid walking on wet floors. ?Keep items that you use a lot in easy-to-reach places. ?If you need to reach something above you, use a strong step stool that has a grab bar. ?Keep electrical cords out of the way. ?Do not use floor polish or wax that makes floors slippery. If you must use wax, use non-skid floor wax. ?Do not have throw rugs and other things on the floor that can make you trip. ?What can I do with my stairs? ?Do not leave any items on the stairs. ?Make sure that there are handrails on both sides of the stairs and use them. Fix handrails that are broken or loose. Make sure that handrails are as long as the stairways. ?Check any carpeting to make sure that it is firmly attached to the stairs. Fix any carpet that is loose or worn. ?Avoid having throw rugs at the  top or bottom of the stairs. If you do have throw rugs, attach them to the floor with carpet tape. ?Make sure that you have a light switch at the top of the stairs and the bottom of the stairs. If you do not have them, ask someone to add them for you. ?What else can I do to help prevent falls? ?Wear shoes that: ?Do not have high heels. ?Have rubber bottoms. ?Are comfortable and fit you well. ?Are closed at the toe. Do not wear sandals. ?If you use a stepladder: ?Make sure that it is fully opened. Do not climb a closed stepladder. ?Make sure that both sides of the stepladder are locked into place. ?Ask someone to hold it for you, if possible. ?Clearly mark and make sure that you can see: ?Any grab bars or  handrails. ?First and last steps. ?Where the edge of each step is. ?Use tools that help you move around (mobility aids) if they are needed. These include: ?Canes. ?Walkers. ?Scooters. ?Crutches. ?Turn on the lights wh

## 2022-01-11 DIAGNOSIS — R972 Elevated prostate specific antigen [PSA]: Secondary | ICD-10-CM | POA: Diagnosis not present

## 2022-01-25 ENCOUNTER — Telehealth: Payer: Self-pay | Admitting: Adult Health

## 2022-01-25 ENCOUNTER — Ambulatory Visit: Payer: Medicare HMO | Admitting: Podiatry

## 2022-01-25 DIAGNOSIS — R972 Elevated prostate specific antigen [PSA]: Secondary | ICD-10-CM | POA: Diagnosis not present

## 2022-01-25 DIAGNOSIS — N5201 Erectile dysfunction due to arterial insufficiency: Secondary | ICD-10-CM | POA: Diagnosis not present

## 2022-01-25 DIAGNOSIS — M21372 Foot drop, left foot: Secondary | ICD-10-CM

## 2022-01-25 DIAGNOSIS — N3941 Urge incontinence: Secondary | ICD-10-CM | POA: Diagnosis not present

## 2022-01-25 NOTE — Telephone Encounter (Signed)
Called patient and informed him Janett Billow ordered the cap for him when he last saw her Dec 2021. He stated he was aware, and he called [facility] today. He was told they only have one cap for $50, and it will only fit on one pair of shoes. He stated he will not pursue it because he wears lots of different shoes. He thanked me for call back.  ?

## 2022-01-25 NOTE — Telephone Encounter (Signed)
Pt is asking for a call to discuss A cap to place on toe to prevent dragging his foot, please call pt to discuss further ?

## 2022-01-30 NOTE — Progress Notes (Signed)
? ?HPI: 71 y.o. male presenting today as a new patient for evaluation of options regarding dropfoot to the left lower extremity.  Patient states that he sustained a stroke about 2 years ago.  He has developed dropfoot and currently wears an AFO for bracing.  He is wondering about different options regarding his left lower extremity other than the AFO brace. ? ?Past Medical History:  ?Diagnosis Date  ? CKD (chronic kidney disease), stage IV ([place]) 12/21/2019  ? Followed by [facility].  Dr.Kruska patient had severe Covid and was on dialysis while in the hospital currently not on dialysis  ? COVID-19   ? Heartburn   ? Stroke [facility]) 2020  ? ? ?Past Surgical History:  ?Procedure Laterality Date  ? ACHILLES TENDON SURGERY Right 2012  ? [place] TRANSPOSITION  10/13/2019  ? Procedure: Basilic Vein Transposition Left Arm;  Surgeon: Angelia Mould, MD;  Location: [facility];  Service: Vascular;;  ? COLONOSCOPY  2009  ? IH  ? ESOPHAGOGASTRODUODENOSCOPY N/A 12/25/2014  ? Procedure: ESOPHAGOGASTRODUODENOSCOPY (EGD);  Surgeon: Danie Binder, MD;  [facility];  Service: Endoscopy;  Laterality: N/A;  830am  ? ESOPHAGOGASTRODUODENOSCOPY N/A 08/09/2019  ? Procedure: ESOPHAGOGASTRODUODENOSCOPY (EGD);  Surgeon: Ronald Lobo, MD;  Location: [facility];  Service: Endoscopy;  Laterality: N/A;  Patient is already sedated, so I do not anticipate need for further sedation.  Okay from my standpoint to do either at the bedside or in the operating room  ? HEMOSTASIS CLIP PLACEMENT  08/09/2019  ? Procedure: HEMOSTASIS CLIP PLACEMENT;  Surgeon: Ronald Lobo, MD;  Location: [facility];  Service: Endoscopy;;  ? HEMOSTASIS CONTROL  08/09/2019  ? Procedure: HEMOSTASIS CONTROL;  Surgeon: Ronald Lobo, MD;  Location: [facility];  Service: Endoscopy;;  epi ?  ? IR FLUORO GUIDE CV LINE LEFT  09/02/2019  ? IR REMOVAL TUN CV CATH W/O FL  10/29/2019  ? IR US GUIDE VASC ACCESS LEFT  09/02/2019  ? KNEE SURGERY Left 1980's   ? LIGATION OF COMPETING BRANCHES OF ARTERIOVENOUS FISTULA Left 04/21/2021  ? Procedure: LIGATION OF LEFT ARM ARTERIOVENOUS BRACHIOBASILIC FISTULA;  Surgeon: Rosetta Posner, MD;  Location: [facility];  Service: Vascular;  Laterality: Left;  ? none    ? WRIST SURGERY Left 1970's  ? ? ?Allergies  ?Allergen Reactions  ? Hydrocodone-Acetaminophen   ?  Disorientation/confusion  ? ?  ?Physical Exam: ?General: The patient is alert and oriented x3 in no acute distress. ? ?Dermatology: Skin is warm, dry and supple bilateral lower extremities. Negative for open lesions or macerations. ? ?Vascular: Palpable pedal pulses bilaterally. Capillary refill within normal limits.  Negative for any significant edema or erythema ? ?Neurological: Light touch and protective threshold grossly intact ? ?Musculoskeletal Exam: Dropfoot deformity noted left lower extremity with loss of muscle strength with dorsiflexion and eversion of the foot. ? ? ?Assessment: ?1.  Dropfoot LLE secondary to stroke x2 years ? ? ?Plan of Care:  ?1. Patient evaluated.  ?2.  We discussed different treatment options for the patient.  I explained to the patient that an AFO brace is the standard of care for dropfoot deformity.  He does not like how bulky the AFO braces.   ?3.  He may continue physical therapy as needed but explained to the patient that he may have this is a lifelong impediment that he will need to manage.   ?4.  Patient was hoping that physical therapy would resolve his symptoms completely.  Recommend that he  complete continues physical therapy to strengthen the musculature around the muscle weakness ?5.  Return to clinic as needed ?  ?  ?Edrick Kins, DPM ?[place] ? ?Dr. Edrick Kins, DPM  ?  ?[address]                                        ?[place], [place] [address]                ?Office [phone]  ?Fax [phone] ? ? ? ? ?

## 2022-04-26 DIAGNOSIS — R972 Elevated prostate specific antigen [PSA]: Secondary | ICD-10-CM | POA: Diagnosis not present

## 2022-04-28 ENCOUNTER — Other Ambulatory Visit: Payer: Self-pay | Admitting: Urology

## 2022-04-28 DIAGNOSIS — R972 Elevated prostate specific antigen [PSA]: Secondary | ICD-10-CM

## 2022-05-23 DIAGNOSIS — C61 Malignant neoplasm of prostate: Secondary | ICD-10-CM

## 2022-05-23 HISTORY — DX: Malignant neoplasm of prostate: C61

## 2022-06-05 ENCOUNTER — Ambulatory Visit
Admission: RE | Admit: 2022-06-05 | Discharge: 2022-06-05 | Disposition: A | Discharge status: Home / Self Care | Payer: Medicare HMO | Source: Ambulatory Visit | Attending: Urology | Admitting: Urology

## 2022-06-05 DIAGNOSIS — R972 Elevated prostate specific antigen [PSA]: Secondary | ICD-10-CM | POA: Diagnosis not present

## 2022-06-05 MED ORDER — GADOBENATE DIMEGLUMINE 529 MG/ML IV SOLN
17.0000 mL | Freq: Once | INTRAVENOUS | Status: AC | PRN
  Administered 2022-06-05: 17 mL via INTRAVENOUS

## 2022-06-22 DIAGNOSIS — R972 Elevated prostate specific antigen [PSA]: Secondary | ICD-10-CM | POA: Diagnosis not present

## 2022-06-22 DIAGNOSIS — C61 Malignant neoplasm of prostate: Secondary | ICD-10-CM | POA: Diagnosis not present

## 2022-08-24 LAB — FECAL OCCULT BLOOD, IMMUNOCHEMICAL: IFOBT: NEGATIVE

## 2022-08-28 DIAGNOSIS — M542 Cervicalgia: Secondary | ICD-10-CM | POA: Diagnosis not present

## 2022-09-08 DIAGNOSIS — C61 Malignant neoplasm of prostate: Secondary | ICD-10-CM | POA: Diagnosis not present

## 2022-09-19 NOTE — Progress Notes (Signed)
GU Location of Tumor / Histology: Prostate Ca  If Prostate Cancer, Gleason Score is (3 + 4) and PSA is (7.83 as of 04/2022)  Biopsies     Past/Anticipated interventions by urology, if any: NA  Past/Anticipated interventions by medical oncology, if any: NA  Weight changes, if any:  No  IPSS:  16 SHIM:  14  Bowel/Bladder complaints, if any:  No  Nausea/Vomiting, if any: No  Pain issues, if any:  8/10 neck pain from fall .  SAFETY ISSUES: Prior radiation?  No Pacemaker/ICD? No Possible current pregnancy? Male Is the patient on methotrexate? No  Current Complaints / other details:  Wants to know if possible to get treatment in [place].  Has foot drop left is wearing brace makes it difficult to get around.

## 2022-09-22 ENCOUNTER — Ambulatory Visit
Admission: RE | Admit: 2022-09-22 | Discharge: 2022-09-22 | Disposition: A | Discharge status: Home / Self Care | Payer: Medicare HMO | Source: Ambulatory Visit | Attending: Radiation Oncology | Admitting: Radiation Oncology

## 2022-09-22 ENCOUNTER — Other Ambulatory Visit: Payer: Self-pay

## 2022-09-22 VITALS — BP 130/92 | HR 82 | Temp 98.6°F | Resp 18 | Ht 70.0 in | Wt 174.6 lb

## 2022-09-22 DIAGNOSIS — Z7982 Long term (current) use of aspirin: Secondary | ICD-10-CM | POA: Insufficient documentation

## 2022-09-22 DIAGNOSIS — Z8673 Personal history of transient ischemic attack (TIA), and cerebral infarction without residual deficits: Secondary | ICD-10-CM | POA: Diagnosis not present

## 2022-09-22 DIAGNOSIS — Z79899 Other long term (current) drug therapy: Secondary | ICD-10-CM | POA: Insufficient documentation

## 2022-09-22 DIAGNOSIS — C61 Malignant neoplasm of prostate: Secondary | ICD-10-CM

## 2022-09-22 DIAGNOSIS — N184 Chronic kidney disease, stage 4 (severe): Secondary | ICD-10-CM | POA: Diagnosis not present

## 2022-09-22 DIAGNOSIS — Z8616 Personal history of COVID-19: Secondary | ICD-10-CM | POA: Insufficient documentation

## 2022-09-22 DIAGNOSIS — Z191 Hormone sensitive malignancy status: Secondary | ICD-10-CM | POA: Diagnosis not present

## 2022-09-22 NOTE — Progress Notes (Signed)
Radiation Oncology         [phone] ________________________________  Initial Outpatient Consultation  Name: [name] MRN: [id number]  Date: 09/22/2022  DOB: [birth year]  [id number], Elayne Snare, MD  Raynelle Bring, MD   REFERRING PHYSICIAN: Raynelle Bring, MD  DIAGNOSIS: 71 y.o. gentleman with Stage T1c adenocarcinoma of the prostate with Gleason score of 3+4, and PSA of 7.83.  No diagnosis found.  HISTORY OF PRESENT ILLNESS: [name] is a 71 y.o. male with a diagnosis of prostate cancer. He has been followed by Dr. Alinda Money since at least 2019 for an elevated PSA. He had a previous biopsy in 02/2018 that was benign. His PSA rose to 7.83 in 04/2022. This prompted prostate MRI on 06/05/22 showing: PI-RADS 5 lesion in right anterior transition zone.  The patient proceeded to transrectal ultrasound with 12 biopsies of the prostate on 06/22/22.  The prostate volume measured 50.4 cc.  Out of 16 core biopsies, 5 were positive.  The maximum Gleason score was 3+4, and this was seen in all four ROI samples (two with perineural invasion) and left apex lateral.  The patient reviewed the biopsy results with his urologist and he has kindly been referred today for discussion of potential radiation treatment options.   PREVIOUS RADIATION THERAPY: No  PAST MEDICAL HISTORY:  Past Medical History:  Diagnosis Date   CKD (chronic kidney disease), stage IV ([place]) 12/21/2019   Followed by [place] [facility].  Dr.Kruska patient had severe Covid and was on dialysis while in the hospital currently not on dialysis   COVID-19    Heartburn    Stroke [facility]) 2020      PAST SURGICAL HISTORY: Past Surgical History:  Procedure Laterality Date   ACHILLES TENDON SURGERY Right [id number]   [place] TRANSPOSITION  10/13/2019   Procedure: Basilic Vein Transposition Left Arm;  Surgeon: Angelia Mould, MD;  Location: [place];  Service: Vascular;;   COLONOSCOPY  2009   [place]   ESOPHAGOGASTRODUODENOSCOPY N/A  12/25/2014   Procedure: ESOPHAGOGASTRODUODENOSCOPY (EGD);  Surgeon: Danie Binder, MD;  [facility];  Service: Endoscopy;  Laterality: N/A;  830am   ESOPHAGOGASTRODUODENOSCOPY N/A 08/09/2019   Procedure: ESOPHAGOGASTRODUODENOSCOPY (EGD);  Surgeon: Ronald Lobo, MD;  Location: [facility];  Service: Endoscopy;  Laterality: N/A;  Patient is already sedated, so I do not anticipate need for further sedation.  Okay from my standpoint to do either at the bedside or in the operating room   HEMOSTASIS CLIP PLACEMENT  08/09/2019   Procedure: [place];  Surgeon: Ronald Lobo, MD;  Location: [facility];  Service: Endoscopy;;   HEMOSTASIS CONTROL  08/09/2019   Procedure: HEMOSTASIS CONTROL;  Surgeon: Ronald Lobo, MD;  Location: [facility];  Service: Endoscopy;;  epi    IR FLUORO GUIDE CV LINE LEFT  09/02/2019   IR REMOVAL TUN CV CATH W/O FL  10/29/2019   IR US GUIDE VASC ACCESS LEFT  09/02/2019   KNEE SURGERY Left 1980's   LIGATION OF COMPETING BRANCHES OF ARTERIOVENOUS FISTULA Left 04/21/2021   Procedure: LIGATION OF LEFT ARM ARTERIOVENOUS BRACHIOBASILIC FISTULA;  Surgeon: Rosetta Posner, MD;  Location: [facility];  Service: Vascular;  Laterality: Left;   none     WRIST SURGERY Left 1970's    FAMILY HISTORY:  Family History  Problem Relation Age of Onset   Colon cancer Neg Hx    Colon polyps Neg Hx     SOCIAL HISTORY:  Social History   Socioeconomic History   Marital  status: Married    Spouse name: [name]   Number of children: 2   Years of education: bachelors   Highest education level: Not on file  Occupational History   Occupation: Scientist, clinical (histocompatibility and immunogenetics): [employer]  Tobacco Use   Smoking status: Never   Smokeless tobacco: Never  Vaping Use   Vaping Use: Never used  Substance and Sexual Activity   Alcohol use: Yes    Alcohol/week: 2.0 standard drinks of alcohol    Types: 2 Shots of liquor per week    Comment: moderate   Drug use: No   Sexual  activity: Not on file  Other Topics Concern   Not on file  Social History Narrative   Retired Barrister's clerk   2 grown sons.    Social Determinants of Health   Financial Resource Strain: Low Risk  (01/10/2022)   Overall Financial Resource Strain (CARDIA)    Difficulty of Paying Living Expenses: Not hard at all  Food Insecurity: No Food Insecurity (01/10/2022)   Hunger Vital Sign    Worried About Running Out of Food in the Last Year: Never true    Ran Out of Food in the Last Year: Never true  Transportation Needs: No Transportation Needs (01/10/2022)   PRAPARE - Hydrologist (Medical): No    Lack of Transportation (Non-Medical): No  Physical Activity: Insufficiently Active (01/10/2022)   Exercise Vital Sign    Days of Exercise per Week: 3 days    Minutes of Exercise per Session: 30 min  Stress: No Stress Concern Present (01/10/2022)   [place]    Feeling of Stress : Not at all  Social Connections: [place] (01/10/2022)   Social Connection and Isolation Panel [NHANES]    Frequency of Communication with Friends and Family: More than three times a week    Frequency of Social Gatherings with Friends and Family: More than three times a week    Attends Religious Services: More than 4 times per year    Active Member of [employer] or Organizations: Yes    Attends Music therapist: More than 4 times per year    Marital Status: Married  Human resources officer Violence: Not At Risk (01/10/2022)   Humiliation, Afraid, Rape, and Kick questionnaire    Fear of Current or Ex-Partner: No    Emotionally Abused: No    Physically Abused: No    Sexually Abused: No    ALLERGIES: Hydrocodone-acetaminophen  MEDICATIONS:  Current Outpatient Medications  Medication Sig Dispense Refill   FLUAD QUADRIVALENT 0.5 ML injection      SPIKEVAX syringe      aspirin EC 81 MG tablet Take 1 tablet (81 mg  total) by mouth daily.     atorvastatin (LIPITOR) 40 MG tablet TAKE 1 TABLET BY MOUTH ONCE DAILY AT  6  PM 90 tablet 0   Cholecalciferol (VITAMIN D3 SUPER STRENGTH) 50 MCG (2000 UT) CAPS Take 2,000 Units by mouth daily.     diazepam (VALIUM) 5 MG tablet 1/2 to 1 bid prn for excessive muscle twitch (Patient taking differently: Take 2.5 mg by mouth daily as needed (excessive muscle twitch).) 24 tablet 1   diclofenac Sodium (VOLTAREN) 1 % GEL Apply 2 g topically 4 (four) times daily. 2 g 0   Nutritional Supplements (FEEDING SUPPLEMENT, NEPRO CARB STEADY,) LIQD Take 237 mLs by mouth 3 (three) times daily between meals. 1000 mL 0   triamcinolone  cream (KENALOG) 0.1 % Apply 1 application. topically 2 (two) times daily. Thin amount- Not to be used in the groin area or the face 30 g 3   No current facility-administered medications for this encounter.    REVIEW OF SYSTEMS:  On review of systems, the patient reports that he is doing well overall. He denies any chest pain, shortness of breath, cough, fevers, chills, night sweats, unintended weight changes. He denies any bowel disturbances, and denies abdominal pain, nausea or vomiting. He denies any new musculoskeletal or joint aches or pains. His IPSS was ***, indicating *** urinary symptoms. His SHIM was ***, indicating he {does not have/has mild/moderate/severe} erectile dysfunction. A complete review of systems is obtained and is otherwise negative.    PHYSICAL EXAM:  Wt Readings from Last 3 Encounters:  01/10/22 168 lb 6.4 oz (76.4 kg)  09/21/21 170 lb (77.1 kg)  05/25/21 168 lb 3.2 oz (76.3 kg)   Temp Readings from Last 3 Encounters:  09/22/21 98.8 F (37.1 C) (Oral)  05/25/21 98.1 F (36.7 C) (Temporal)  04/21/21 97.7 F (36.5 C) (Oral)   BP Readings from Last 3 Encounters:  01/10/22 130/82  09/22/21 104/70  05/25/21 136/87   Pulse Readings from Last 3 Encounters:  01/10/22 65  09/22/21 86  05/25/21 68    /10  In general this is a  well appearing *** male in no acute distress. He's alert and oriented x4 and appropriate throughout the examination. Cardiopulmonary assessment is negative for acute distress, and he exhibits normal effort.     KPS = ***  100 - Normal; no complaints; no evidence of disease. 90   - Able to carry on normal activity; minor signs or symptoms of disease. 80   - Normal activity with effort; some signs or symptoms of disease. 63   - Cares for self; unable to carry on normal activity or to do active work. 60   - Requires occasional assistance, but is able to care for most of his personal needs. 50   - Requires considerable assistance and frequent medical care. 37   - Disabled; requires special care and assistance. 61   - Severely disabled; hospital admission is indicated although death not imminent. 60   - Very sick; hospital admission necessary; active supportive treatment necessary. 10   - Moribund; fatal processes progressing rapidly. 0     - Dead  Karnofsky DA, Abelmann [place] [place] [facility], Craver LS and Burchenal [facility] [phone]) The use of the nitrogen mustards in the palliative treatment of carcinoma: with particular reference to bronchogenic carcinoma Cancer 1 634-56  LABORATORY DATA:  Lab Results  Component Value Date   WBC 10.9 (H) 09/21/2021   HGB 11.9 (L) 09/21/2021   HCT 35.0 (L) 09/21/2021   MCV 96.1 09/21/2021   PLT 146 (L) 09/21/2021   Lab Results  Component Value Date   NA 137 09/21/2021   K 3.9 09/21/2021   CL 102 09/21/2021   CO2 23 09/21/2021   Lab Results  Component Value Date   ALT 14 09/21/2021   AST 16 09/21/2021   ALKPHOS 34 (L) 09/21/2021   BILITOT 0.5 09/21/2021     RADIOGRAPHY: No results found.    IMPRESSION/PLAN: 1. 71 y.o. gentleman with Stage T1c adenocarcinoma of the prostate with Gleason Score of 3+4, and PSA of 7.83. We discussed the patient's workup and outlined the nature of prostate cancer in this setting. The patient's T stage, Gleason's score, and PSA put him  into the favorable  intermediate risk group. Accordingly, he is eligible for a variety of potential treatment options including brachytherapy, 5.5 weeks of external radiation, or prostatectomy. We discussed the available radiation techniques, and focused on the details and logistics of delivery. We discussed and outlined the risks, benefits, short and long-term effects associated with radiotherapy and compared and contrasted these with prostatectomy. We discussed the role of SpaceOAR gel in reducing the rectal toxicity associated with radiotherapy. He appears to have a good understanding of his disease and our treatment recommendations which are of curative intent.  He was encouraged to ask questions that were answered to his stated satisfaction.  At the conclusion of our conversation, the patient is interested in moving forward with ***.  We personally spent *** minutes in this encounter including chart review, reviewing radiological studies, meeting face-to-face with the patient, entering orders and completing documentation.    Nicholos Johns, PA-C    Tyler Pita, MD  [facility] Direct Dial: [phone]  Fax: [phone] [email or web address]  Skype  LinkedIn   This document serves as a record of services personally performed by Tyler Pita, MD and Freeman Caldron, PA-C. It was created on their behalf by Wilburn Mylar, a trained medical scribe. The creation of this record is based on the scribe's personal observations and the provider's statements to them. This document has been checked and approved by the attending provider.

## 2022-09-25 ENCOUNTER — Other Ambulatory Visit: Payer: Self-pay | Admitting: Urology

## 2022-09-25 DIAGNOSIS — C61 Malignant neoplasm of prostate: Secondary | ICD-10-CM

## 2022-09-28 DIAGNOSIS — C61 Malignant neoplasm of prostate: Secondary | ICD-10-CM | POA: Diagnosis not present

## 2022-09-29 ENCOUNTER — Encounter: Payer: Self-pay | Admitting: Urology

## 2022-09-29 NOTE — Progress Notes (Signed)
Patient saw Dr. Lynnette Caffey at [facility] yesterday, Dec.7 to discuss getting his daily radiation treatments closer to home.  Nicholos Johns, MMS, PA-C [place] [facility] [place]: [phone]  Fax: [phone] .

## 2022-10-05 ENCOUNTER — Other Ambulatory Visit: Payer: Self-pay | Admitting: Urology

## 2022-10-09 ENCOUNTER — Encounter: Payer: Self-pay | Admitting: *Deleted

## 2022-10-31 ENCOUNTER — Ambulatory Visit (INDEPENDENT_AMBULATORY_CARE_PROVIDER_SITE_OTHER): Payer: Medicare HMO | Admitting: Family Medicine

## 2022-10-31 ENCOUNTER — Telehealth: Payer: Self-pay | Admitting: Family Medicine

## 2022-10-31 DIAGNOSIS — H5203 Hypermetropia, bilateral: Secondary | ICD-10-CM | POA: Diagnosis not present

## 2022-10-31 DIAGNOSIS — U071 COVID-19: Secondary | ICD-10-CM | POA: Diagnosis not present

## 2022-10-31 MED ORDER — MOLNUPIRAVIR EUA 200MG CAPSULE: 4.0000 | ORAL_CAPSULE | Freq: Two times a day (BID) | ORAL | 0 refills | Status: DC

## 2022-10-31 NOTE — Telephone Encounter (Signed)
Pt wife calling due to patient testing positive for COVID this afternoon. Pt wife states pt was in hospital for 4 months and they almost lost him. Pt states he is feeling weak and not feeling good. Please advise. Thank you (Wife began crying on phone)

## 2022-10-31 NOTE — Progress Notes (Signed)
   Subjective:    Patient ID: [name], male    DOB: 1951/06/04, 72 y.o.   MRN: [id number]  HPI Pt tested positive for COVID this afternoon. Not feeling good and weak.  Significant head congestion drainage coughing not feeling good symptoms over the past 24 hours had previous COVID which caused severe renal disease, stroke Virtual Visit via Video Note  I connected with [name] on 10/31/22 at  4:30 PM EST by a video enabled telemedicine application and verified that I am speaking with the correct person using two identifiers.  Location: Patient: home Provider: office   I discussed the limitations of evaluation and management by telemedicine and the availability of in person appointments. The patient expressed understanding and agreed to proceed.  History of Present Illness:    Observations/Objective:   Assessment and Plan:   Follow Up Instructions:    I discussed the assessment and treatment plan with the patient. The patient was provided an opportunity to ask questions and all were answered. The patient agreed with the plan and demonstrated an understanding of the instructions.   The patient was advised to call back or seek an in-person evaluation if the symptoms worsen or if the condition fails to improve as anticipated.  I provided 15 minutes of non-face-to-face time during this encounter.       Review of Systems     Objective:   Physical Exam  Patient had virtual visit-video Appears to be in no distress Atraumatic Neuro able to relate and oriented No apparent resp distress Color normal       Assessment & Plan:  COVID Molnupiravir This was sent into [facility] Unfortunately they did not have medication We will have to try another pharmacy

## 2022-11-01 ENCOUNTER — Ambulatory Visit (INDEPENDENT_AMBULATORY_CARE_PROVIDER_SITE_OTHER): Payer: Medicare HMO | Admitting: Family Medicine

## 2022-11-01 ENCOUNTER — Telehealth: Payer: Self-pay | Admitting: Family Medicine

## 2022-11-01 ENCOUNTER — Encounter: Payer: Self-pay | Admitting: Family Medicine

## 2022-11-01 VITALS — BP 138/91 | HR 84 | Temp 98.4°F | Wt 179.2 lb

## 2022-11-01 DIAGNOSIS — U071 COVID-19: Secondary | ICD-10-CM

## 2022-11-01 MED ORDER — MOLNUPIRAVIR EUA 200MG CAPSULE: 4.0000 | ORAL_CAPSULE | Freq: Two times a day (BID) | ORAL | 0 refills | Status: AC

## 2022-11-01 MED ORDER — MOLNUPIRAVIR EUA 200MG CAPSULE: 4.0000 | ORAL_CAPSULE | Freq: Two times a day (BID) | ORAL | 0 refills | Status: DC

## 2022-11-01 NOTE — Progress Notes (Signed)
   Subjective:    Patient ID: [name], male    DOB: 1951/06/05, 72 y.o.   MRN: [id number]  HPI Pt arrives due to being COVID positive. Wife states test was old-tested yesterday. Pt feeling weak  Patient with bodyaches headache not feeling good energy level low denies high fever chills sweats Patient tested positive for COVID Had a severe case of COVID previously   Review of Systems     Objective:   Physical Exam  Gen-NAD not toxic TMS-normal bilateral T- normal no redness Chest-CTA respiratory rate normal no crackles CV RRR no murmur Skin-warm dry Neuro-grossly normal       Assessment & Plan:  COVID Warning signs discussed Molnupiravir sent in Patient has underlying kidney disease Will do a follow-up visit later this spring with lab work Follow-up sooner if any problems

## 2022-11-01 NOTE — Telephone Encounter (Signed)
I did a video visit with patient he is coming in today 4 PM

## 2022-11-01 NOTE — Telephone Encounter (Signed)
Prescription was sent to [facility] drug patient aware

## 2022-11-01 NOTE — Telephone Encounter (Signed)
[place] has plenty of Molnupiravir 200 mg in stock. Please advise. Thank you

## 2022-11-01 NOTE — Telephone Encounter (Signed)
Patient added to schedule.

## 2022-11-02 ENCOUNTER — Other Ambulatory Visit: Payer: Self-pay | Admitting: Family Medicine

## 2022-11-02 DIAGNOSIS — N1832 Chronic kidney disease, stage 3b: Secondary | ICD-10-CM

## 2022-11-02 DIAGNOSIS — Z0000 Encounter for general adult medical examination without abnormal findings: Secondary | ICD-10-CM

## 2022-11-02 DIAGNOSIS — Z79899 Other long term (current) drug therapy: Secondary | ICD-10-CM

## 2022-11-02 DIAGNOSIS — N289 Disorder of kidney and ureter, unspecified: Secondary | ICD-10-CM

## 2022-11-02 DIAGNOSIS — Z13220 Encounter for screening for lipoid disorders: Secondary | ICD-10-CM

## 2022-11-02 NOTE — Progress Notes (Signed)
atient will need to do lipid, metabolic 7, CBC, liver, urine ACR in the spring time with follow-up office visit in the spring please condense his annual wellness visit to be a follow-up visit with myself along with the annual wellness this could be in early April or late March diagnosis hyperlipidemia, chronic kidney disease   Front please set pt up for annual wellness and follow up

## 2022-11-07 ENCOUNTER — Telehealth: Payer: Self-pay | Admitting: Family Medicine

## 2022-11-07 NOTE — Telephone Encounter (Signed)
Front Patient was recently seen by [place] At that time he was treated for respiratory illness but then it was recommended for this patient to do a follow-up visit with me he is close to do blood work in the spring he has a annual wellness visit scheduled in early April.  I would recommend that we do office visit with myself for annual wellness visit and chronic kidney disease it can be in March that would be fine with me or early April at the latest he can go ahead and get his blood work done before that visit thank you

## 2022-11-08 NOTE — Telephone Encounter (Signed)
Please advise. Thank you

## 2022-11-10 ENCOUNTER — Encounter (HOSPITAL_BASED_OUTPATIENT_CLINIC_OR_DEPARTMENT_OTHER): Payer: Self-pay | Admitting: Urology

## 2022-11-12 ENCOUNTER — Telehealth: Payer: Self-pay | Admitting: Family Medicine

## 2022-11-12 NOTE — Telephone Encounter (Signed)
Please fax form-[facility] requested holding aspirin for his upcoming surgical procedure for prostate cancer

## 2022-11-14 ENCOUNTER — Encounter (HOSPITAL_BASED_OUTPATIENT_CLINIC_OR_DEPARTMENT_OTHER): Payer: Self-pay | Admitting: Urology

## 2022-11-14 NOTE — Progress Notes (Signed)
Spoke w/ via phone for pre-op interview--- pt Lab needs dos---- [employer] results------ no COVID test -----patient states test positive 10-31-2022 symptoms resolved with exception dry cough intermittant (never cough while on the phone) pt is past recommended time for surgery Arrive at ------- 0600 on 11-16-2022 NPO after MN NO Solid Food.  Clear liquids from MN until--- 0500 Med rec completed Medications to take morning of surgery ----- none Diabetic medication ----- n/a Patient instructed no nail polish to be worn day of surgery Patient instructed to bring photo id and insurance card day of surgery Patient aware to have Driver (ride ) / caregiver    for 24 hours after surgery -- wife, joan Patient Special Instructions ----- will one fleet enema night before surgery Pre-Op special Istructions ----- pt stated stopped approx 2 wks ago , he states when office told him to stop.  Called and spoke w/ Zona, OR scheduler for dr borden, for clearance for asa from pt's pcp since take for stroke prevention Patient verbalized understanding of instructions that were given at this phone interview. Patient denies shortness of breath, chest pain, fever, cough at this phone interview.

## 2022-11-15 NOTE — H&P (Signed)
CC: Prostate Cancer   PCP: Dr. Sallee Lange   [name] is a 72 year old gentleman who has a history of an elevated PSA. He underwent a prostate biopsy in May 2019 for a PSA of 3.97. This was benign. His PSA continued to increase to 7.83 in 2023 prompting an MRI of the prostate on 06/05/22 that demonstrated a 1.6 cm PI-RADS 5 lesion of the right anterior transition zone. An MR/US fusion biopsy was performed on 06/22/22 that demonstrated 5 out of 16 biopsy cores positive for Gleason 3+4=7 adenocarcinoma including all 4 targeted MR biopsies and 1 out of 12 systematic biopsies.   Family history: None.   Imaging studies: MRI (06/05/22): No EPE, SVI, LAD, or bone lesions.   PMH: He has a history of stroke, hyperlipidemia.  PSH: No abdominal surgeries.   TNM stage: cT1c N0 Mx  PSA: 7.83  Gleason score: 3+4=7 (GG 2)  Biopsy (06/22/22): 5/12 cores positive  Left: L lateral apex (20%, 3+4=7)  Right: Benign  MR target: 4/4 cores, 3+4=7, 90%, 60%, 70%, 10%, and PNI  Prostate volume: 50.4 cc   Nomogram  OC disease: 57%  EPE: 41%  SVI: 4%  LNI: 4%  PFS (5 year, 10 year): 81%, 69%   Urinary function: IPSS is 25. He is not on medication and after further discussion states that his symptoms may not be quite as bothersome as his score indicates.  Erectile function: SHIM score is 7. However, he does respond to sildenafil 100 mg relatively well.     ALLERGIES: hydrocodone    MEDICATIONS: Diazepam 10 mg tablet Take 10 mg 30-60 minutes prior to your procedure  Levofloxacin 750 mg tablet Please take one tablet the morning of your biopsy.  Sildenafil Citrate 100 mg tablet 1 tablet PO as directed PRN  Adult Low Dose Aspirin Ec 81 mg tablet, delayed release  Atorvastatin Calcium  Tadalafil 20 mg tablet 1 tablet PO PRN as directed     GU PSH: Prostate Needle Biopsy - 06/22/2022, 2019     NON-GU PSH: Anesth, Achilles Tendon Surg Surgical Pathology, Gross And Microscopic Examination For Prostate  Needle - 06/22/2022, 2019     GU PMH: Elevated PSA - 06/22/2022, - 01/25/2022, - 2019 ED due to arterial insufficiency - 01/25/2022, - 2021 Urge incontinence - 01/25/2022 Premature ejaculation - 2019      PMH Notes:   1) Elevated PSA: He presented to me in May 2019 for an elevated PSA of 4.7. This was repeated and remained elevated at 3.97 prompting an initial prostate biopsy in May 2019. He has no family history of prostate cancer.   May 2019: 12 core biopsy - benign, Vol 54.7 cc   2) Premature ejaculation: He has previously been treated with paroxetine 20 mg on demand.      NON-GU PMH: Personal history of COVID-19 Stroke/TIA    FAMILY HISTORY: 2 sons - Runs in Family copd - Father Emphysema - Father   SOCIAL HISTORY: Marital Status: Married Preferred Language: English; Ethnicity: Not Hispanic Or Latino; Race: Black or African American Current Smoking Status: Patient has never smoked.   Tobacco Use Assessment Completed: Used Tobacco in last 30 days? Does drink.  Does not drink caffeine.     Notes: On occasion pt will have a caffeinated drink   REVIEW OF SYSTEMS:    GU Review Male:   Patient denies frequent urination, hard to postpone urination, burning/ pain with urination, get up at night to urinate, leakage of urine,  stream starts and stops, trouble starting your streams, and have to strain to urinate .  Gastrointestinal (Upper):   Patient denies nausea and vomiting.  Gastrointestinal (Lower):   Patient denies diarrhea and constipation.  Constitutional:   Patient denies fever, night sweats, weight loss, and fatigue.  Skin:   Patient denies skin rash/ lesion and itching.  Eyes:   Patient denies blurred vision and double vision.  Ears/ Nose/ Throat:   Patient denies sore throat and sinus problems.  Hematologic/Lymphatic:   Patient denies swollen glands and easy bruising.  Cardiovascular:   Patient denies leg swelling and chest pains.  Respiratory:   Patient denies cough and  shortness of breath.  Endocrine:   Patient denies excessive thirst.  Musculoskeletal:   Patient denies back pain and joint pain.  Neurological:   Patient denies headaches and dizziness.  Psychologic:   Patient denies depression and anxiety.    Weight 175 lb / 79.38 kg  Height 70.5 in / 179.07 cm   BMI 24.8 kg/m   MULTI-SYSTEM PHYSICAL EXAMINATION:    Constitutional: Well-nourished. No physical deformities. Normally developed. Good grooming.     Complexity of Data:  Lab Test Review:   PSA  Records Review:   Pathology Reports, Previous Patient Records   04/26/22 01/25/22 04/30/20 04/28/19 10/04/18 02/20/18  PSA  Total PSA 7.83 ng/mL 5.90 ng/mL 4.09 ng/mL 3.89 ng/mL 3.02 ng/mL 3.97 ng/mL  Free PSA 1.18 ng/mL 1.27 ng/mL 0.64 ng/mL     % Free PSA 15 % PSA 22 % PSA 16 % PSA          ASSESSMENT:      ICD-10 Details  1 GU:   Prostate Cancer - C61    PLAN:          1. Prostate cancer: I had a detailed discussion with him and his wife regarding his prostate cancer diagnosis. The patient was counseled about the natural history of prostate cancer and the standard treatment options that are available for prostate cancer. It was explained to him how his age and life expectancy, clinical stage, Gleason score/prognostic grade group, and PSA (and PSA density) affect his prognosis, the decision to proceed with additional staging studies, as well as how that information influences recommended treatment strategies. We discussed the roles for active surveillance, radiation therapy, surgical therapy, androgen deprivation, as well as ablative therapy and other investigational options for the treatment of prostate cancer as appropriate to his individual cancer situation. We discussed the risks and benefits of these options with regard to their impact on cancer control and also in terms of potential adverse events, complications, and impact on quality of life particularly related to urinary and sexual  function. The patient was encouraged to ask questions throughout the discussion today and all questions were answered to his stated satisfaction. In addition, the patient was provided with and/or directed to appropriate resources and literature for further education about prostate cancer and treatment options.   We discussed surgical therapy for prostate cancer including the different available surgical approaches. We discussed, in detail, the risks and expectations of surgery with regard to cancer control, urinary control, and erectile function as well as the expected postoperative recovery process. Additional risks of surgery including but not limited to bleeding, infection, hernia formation, nerve damage, lymphocele formation, bowel/rectal injury potentially necessitating colostomy, damage to the urinary tract resulting in urine leakage, urethral stricture, and the cardiopulmonary risks such as myocardial infarction, stroke, death, venothromboembolism, etc. were explained. The risk of open  surgical conversion for robotic/laparoscopic prostatectomy was also discussed.   After discussion, he appears to be leaning toward radiation therapy considering his history of stroke. He does have significant lower urinary tract symptoms, and although these may not be as bad as his symptoms course suggest, he is not likely a good candidate for a seed implant based on those symptoms. We therefore discussed external beam radiation therapy is a reasonable approach. He would like to meet with Dr. Tammi Klippel to discuss this further. He will then notify me of his final decision on how he would like to proceed.

## 2022-11-15 NOTE — Telephone Encounter (Signed)
Faxed over twice yesterday.

## 2022-11-16 ENCOUNTER — Ambulatory Visit (HOSPITAL_BASED_OUTPATIENT_CLINIC_OR_DEPARTMENT_OTHER): Payer: Medicare HMO | Admitting: Anesthesiology

## 2022-11-16 ENCOUNTER — Ambulatory Visit (HOSPITAL_BASED_OUTPATIENT_CLINIC_OR_DEPARTMENT_OTHER)
Admission: RE | Admit: 2022-11-16 | Discharge: 2022-11-16 | Disposition: A | Discharge status: Home / Self Care | Payer: Medicare HMO | Attending: Urology | Admitting: Urology

## 2022-11-16 ENCOUNTER — Other Ambulatory Visit: Payer: Self-pay

## 2022-11-16 ENCOUNTER — Encounter (HOSPITAL_BASED_OUTPATIENT_CLINIC_OR_DEPARTMENT_OTHER): Payer: Self-pay | Admitting: Urology

## 2022-11-16 ENCOUNTER — Encounter (HOSPITAL_BASED_OUTPATIENT_CLINIC_OR_DEPARTMENT_OTHER)
Admission: RE | Disposition: A | Discharge status: Home / Self Care | Payer: Self-pay | Source: Home / Self Care | Attending: Urology

## 2022-11-16 DIAGNOSIS — K449 Diaphragmatic hernia without obstruction or gangrene: Secondary | ICD-10-CM | POA: Diagnosis not present

## 2022-11-16 DIAGNOSIS — K279 Peptic ulcer, site unspecified, unspecified as acute or chronic, without hemorrhage or perforation: Secondary | ICD-10-CM | POA: Diagnosis not present

## 2022-11-16 DIAGNOSIS — I4510 Unspecified right bundle-branch block: Secondary | ICD-10-CM | POA: Diagnosis not present

## 2022-11-16 DIAGNOSIS — C61 Malignant neoplasm of prostate: Secondary | ICD-10-CM | POA: Diagnosis not present

## 2022-11-16 DIAGNOSIS — Z01818 Encounter for other preprocedural examination: Secondary | ICD-10-CM

## 2022-11-16 DIAGNOSIS — G709 Myoneural disorder, unspecified: Secondary | ICD-10-CM | POA: Diagnosis not present

## 2022-11-16 DIAGNOSIS — I6990 Unspecified sequelae of unspecified cerebrovascular disease: Secondary | ICD-10-CM | POA: Insufficient documentation

## 2022-11-16 HISTORY — DX: Diaphragmatic hernia without obstruction or gangrene: K44.9

## 2022-11-16 HISTORY — PX: GOLD SEED IMPLANT: SHX6343

## 2022-11-16 HISTORY — DX: Male erectile dysfunction, unspecified: N52.9

## 2022-11-16 HISTORY — DX: Personal history of other diseases of the digestive system: Z87.19

## 2022-11-16 HISTORY — DX: Foot drop, left foot: M21.372

## 2022-11-16 HISTORY — DX: Paralysis of vocal cords and larynx, unilateral: J38.01

## 2022-11-16 HISTORY — DX: Benign prostatic hyperplasia without lower urinary tract symptoms: N40.0

## 2022-11-16 HISTORY — PX: SPACE OAR INSTILLATION: SHX6769

## 2022-11-16 HISTORY — DX: Gastro-esophageal reflux disease without esophagitis: K21.9

## 2022-11-16 HISTORY — DX: Chronic kidney disease, unspecified: N18.9

## 2022-11-16 HISTORY — DX: Anemia in chronic kidney disease: D63.1

## 2022-11-16 LAB — POCT I-STAT, CHEM 8
BUN: 26 mg/dL — ABNORMAL HIGH (ref 8–23)
Calcium, Ion: 1.27 mmol/L (ref 1.15–1.40)
Chloride: 107 mmol/L (ref 98–111)
Creatinine, Ser: 2.2 mg/dL — ABNORMAL HIGH (ref 0.61–1.24)
Glucose, Bld: 89 mg/dL (ref 70–99)
HCT: 40 % (ref 39.0–52.0)
Hemoglobin: 13.6 g/dL (ref 13.0–17.0)
Potassium: 4.3 mmol/L (ref 3.5–5.1)
Sodium: 143 mmol/L (ref 135–145)
TCO2: 24 mmol/L (ref 22–32)

## 2022-11-16 SURGERY — INSERTION, GOLD SEEDS
Anesthesia: Monitor Anesthesia Care | Site: Prostate

## 2022-11-16 MED ORDER — ONDANSETRON HCL 4 MG/2ML IJ SOLN
INTRAMUSCULAR | Status: AC
  Filled 2022-11-16: qty 2

## 2022-11-16 MED ORDER — OXYCODONE HCL 5 MG PO TABS: 5.0000 mg | ORAL_TABLET | Freq: Once | ORAL | Status: DC | PRN

## 2022-11-16 MED ORDER — FENTANYL CITRATE (PF) 100 MCG/2ML IJ SOLN
INTRAMUSCULAR | Status: DC | PRN
  Administered 2022-11-16: 50 ug via INTRAVENOUS

## 2022-11-16 MED ORDER — FENTANYL CITRATE (PF) 100 MCG/2ML IJ SOLN: 25.0000 ug | INTRAMUSCULAR | Status: DC | PRN

## 2022-11-16 MED ORDER — CEFAZOLIN SODIUM-DEXTROSE 2-4 GM/100ML-% IV SOLN
INTRAVENOUS | Status: AC
  Filled 2022-11-16: qty 100

## 2022-11-16 MED ORDER — PROPOFOL 10 MG/ML IV BOLUS
INTRAVENOUS | Status: AC
  Filled 2022-11-16: qty 20

## 2022-11-16 MED ORDER — BUPIVACAINE HCL 0.25 % IJ SOLN
INTRAMUSCULAR | Status: DC | PRN
  Administered 2022-11-16: 10 mL

## 2022-11-16 MED ORDER — SODIUM CHLORIDE 0.9 % IV SOLN
INTRAVENOUS | Status: DC
  Administered 2022-11-16: 1000 mL via INTRAVENOUS

## 2022-11-16 MED ORDER — PROPOFOL 500 MG/50ML IV EMUL
INTRAVENOUS | Status: DC | PRN
  Administered 2022-11-16: 250 ug/kg/min via INTRAVENOUS

## 2022-11-16 MED ORDER — PROPOFOL 10 MG/ML IV BOLUS
INTRAVENOUS | Status: DC | PRN
  Administered 2022-11-16: 20 mg via INTRAVENOUS

## 2022-11-16 MED ORDER — FENTANYL CITRATE (PF) 100 MCG/2ML IJ SOLN
INTRAMUSCULAR | Status: AC
  Filled 2022-11-16: qty 2

## 2022-11-16 MED ORDER — ONDANSETRON HCL 4 MG/2ML IJ SOLN: 4.0000 mg | Freq: Four times a day (QID) | INTRAMUSCULAR | Status: DC | PRN

## 2022-11-16 MED ORDER — CEFAZOLIN SODIUM-DEXTROSE 2-4 GM/100ML-% IV SOLN
2.0000 g | INTRAVENOUS | Status: AC
  Administered 2022-11-16: 2 g via INTRAVENOUS

## 2022-11-16 MED ORDER — OXYCODONE HCL 5 MG/5ML PO SOLN: 5.0000 mg | Freq: Once | ORAL | Status: DC | PRN

## 2022-11-16 MED ORDER — SODIUM CHLORIDE (PF) 0.9 % IJ SOLN
INTRAMUSCULAR | Status: DC | PRN
  Administered 2022-11-16: 10 mL

## 2022-11-16 SURGICAL SUPPLY — 24 items
BLADE CLIPPER SENSICLIP SURGIC (BLADE) ×1 IMPLANT
CNTNR URN SCR LID CUP LEK RST (MISCELLANEOUS) ×1 IMPLANT
CONT SPEC 4OZ STRL OR WHT (MISCELLANEOUS) ×1
COVER BACK TABLE 60X90IN (DRAPES) ×1 IMPLANT
DRSG TEGADERM 4X4.75 (GAUZE/BANDAGES/DRESSINGS) ×1 IMPLANT
DRSG TEGADERM 8X12 (GAUZE/BANDAGES/DRESSINGS) ×1 IMPLANT
GAUZE SPONGE 4X4 12PLY STRL (GAUZE/BANDAGES/DRESSINGS) ×1 IMPLANT
GLOVE BIO SURGEON STRL SZ7.5 (GLOVE) ×1 IMPLANT
GLOVE SURG ORTHO 8.5 STRL (GLOVE) ×1 IMPLANT
IMPL SPACEOAR VUE SYSTEM (Spacer) ×1 IMPLANT
IMPLANT SPACEOAR VUE SYSTEM (Spacer) ×1 IMPLANT
KIT TURNOVER CYSTO (KITS) ×1 IMPLANT
MARKER GOLD PRELOAD 1.2X3 (Urological Implant) ×1 IMPLANT
MARKER SKIN DUAL TIP RULER LAB (MISCELLANEOUS) ×1 IMPLANT
NDL SPNL 22GX3.5 QUINCKE BK (NEEDLE) ×1 IMPLANT
NEEDLE SPNL 22GX3.5 QUINCKE BK (NEEDLE) ×1 IMPLANT
SEED GOLD PRELOAD 1.2X3 (Urological Implant) ×1 IMPLANT
SHEATH ULTRASOUND LF (SHEATH) IMPLANT
SHEATH ULTRASOUND LTX NONSTRL (SHEATH) IMPLANT
SURGILUBE 2OZ TUBE FLIPTOP (MISCELLANEOUS) ×1 IMPLANT
SYR 10ML LL (SYRINGE) ×1 IMPLANT
SYR CONTROL 10ML LL (SYRINGE) ×1 IMPLANT
TOWEL OR 17X26 10 PK STRL BLUE (TOWEL DISPOSABLE) ×1 IMPLANT
UNDERPAD 30X36 HEAVY ABSORB (UNDERPADS AND DIAPERS) ×1 IMPLANT

## 2022-11-16 NOTE — Anesthesia Preprocedure Evaluation (Signed)
Anesthesia Evaluation  Patient identified by MRN, date of birth, ID band Patient awake    Reviewed: Allergy & Precautions, H&P , NPO status , Patient's Chart, lab work & pertinent test results  Airway Mallampati: II   Neck ROM: full    Dental   Pulmonary neg pulmonary ROS   breath sounds clear to auscultation       Cardiovascular negative cardio ROS  Rhythm:regular Rate:Normal     Neuro/Psych Residual left hemiparesis  Neuromuscular disease CVA, Residual Symptoms    GI/Hepatic hiatal hernia, PUD,,,  Endo/Other    Renal/GU Renal diseaseH/o ARF with HD in setting of severe COVID in 2020.  No longer on dialysis.   Prostate CA    Musculoskeletal   Abdominal   Peds  Hematology   Anesthesia Other Findings   Reproductive/Obstetrics                             Anesthesia Physical Anesthesia Plan  ASA: 3  Anesthesia Plan: MAC   Post-op Pain Management:    Induction: Intravenous  PONV Risk Score and Plan: 1 and Propofol infusion and Treatment may vary due to age or medical condition  Airway Management Planned: Simple Face Mask  Additional Equipment:   Intra-op Plan:   Post-operative Plan:   Informed Consent: I have reviewed the patients History and Physical, chart, labs and discussed the procedure including the risks, benefits and alternatives for the proposed anesthesia with the patient or authorized representative who has indicated his/her understanding and acceptance.     Dental advisory given  Plan Discussed with: CRNA, Anesthesiologist and Surgeon  Anesthesia Plan Comments:        Anesthesia Quick Evaluation

## 2022-11-16 NOTE — Discharge Instructions (Addendum)
You should avoid strenuous activities today but may resume all normal activities tomorrow.  2.   You can take Tylenol as needed for any pain or discomfort.  3.    Follow up with your radiation oncologist for your simulation appointment as scheduled.  If this is not currently scheduled or you do not know the date/time for that appointment, please contact the radiation oncology office to confirm.   4.    Restart aspirin in 48 hrs.       Post Anesthesia Home Care Instructions  Activity: Get plenty of rest for the remainder of the day. A responsible individual must stay with you for 24 hours following the procedure.  For the next 24 hours, DO NOT: -Drive a car -Paediatric nurse -Drink alcoholic beverages -Take any medication unless instructed by your physician -Make any legal decisions or sign important papers.  Meals: Start with liquid foods such as gelatin or soup. Progress to regular foods as tolerated. Avoid greasy, spicy, heavy foods. If nausea and/or vomiting occur, drink only clear liquids until the nausea and/or vomiting subsides. Call your physician if vomiting continues.  Special Instructions/Symptoms: Your throat may feel dry or sore from the anesthesia or the breathing tube placed in your throat during surgery. If this causes discomfort, gargle with warm salt water. The discomfort should disappear within 24 hours.

## 2022-11-16 NOTE — Op Note (Signed)
Preoperative diagnosis: Prostate cancer   Postoperative diagnosis: Prostate cancer   Procedure:  1) Fiducial marker placement into the prostate 2) Insertion of SpaceOAR hydrogel    Surgeon: Pryor Curia. M.D.   Anesthesia: IV sedation, Local anesthesia   EBL: Minimal   Complications: None   Indication: The potential risks, complications, alternative options, and expected recovery course associated with the above procedure(s) have been discussed in detail with the patient and he has provided informed consent to proceed.   Description of procedure: The patient was administered preoperative antibiotics, placed in the dorsal lithotomy position, and prepped and draped in the usual sterile fashion. A preoperative time out was performed.  Next, transrectal ultrasonography was utilized to visualize the prostate. 10 cc of 0.25% bupivacaine was then used to infiltrate the subcuateous tissue of the perineum and an additional 10 cc was injected into the lateral apical tissue surrounding the prostate for a periprostatic nerve block.  Three gold fiducial markers were then placed into the prostate via transperineal needles under ultrasound guidance at the right apex, right base, and left mid gland under direct ultrasound guidance.  A site in the midline was then selected on the perineum for placement of an 18 g needle with saline.  The needle was advanced above the rectum and below Denonvillier's fascia to the mid gland and confirmed to be in the midline on transverse imaging.  One cc of saline was injected confirming appropriate expansion of this space.  A total of 5-10 cc of saline was then injected to open the space further bilaterally.  The saline syringe was then removed and the SpaceOAR hydrogel was injected with good distribution bilaterally. He tolerated the procedure well and without complications. He was able to be awakened and transferred to the PACU in stable condition.

## 2022-11-16 NOTE — Transfer of Care (Signed)
Immediate Anesthesia Transfer of Care Note  Patient: [name]  Procedure(s) Performed: GOLD SEED IMPLANT (Prostate) SPACE OAR INSTILLATION (Prostate)  Patient Location: PACU  Anesthesia Type:MAC  Level of Consciousness: awake, alert , oriented, and patient cooperative  Airway & Oxygen Therapy: Patient Spontanous Breathing  Post-op Assessment: Report given to RN and Post -op Vital signs reviewed and stable  Post vital signs: Reviewed and stable  Last Vitals:  Vitals Value Taken Time  BP 124/53 11/16/22 0832  Temp    Pulse 86 11/16/22 0834  Resp 15 11/16/22 0834  SpO2 98 % 11/16/22 0834  Vitals shown include unvalidated device data.  Last Pain:  Vitals:   11/16/22 0653  TempSrc: Oral  PainSc: 0-No pain      Patients Stated Pain Goal: 4 (03/02/01 1117)  Complications: No notable events documented.

## 2022-11-17 ENCOUNTER — Encounter (HOSPITAL_BASED_OUTPATIENT_CLINIC_OR_DEPARTMENT_OTHER): Payer: Self-pay | Admitting: Urology

## 2022-11-17 NOTE — Anesthesia Postprocedure Evaluation (Signed)
Anesthesia Post Note  Patient: [name]  Procedure([name]) Performed: GOLD SEED IMPLANT (Prostate) SPACE OAR INSTILLATION (Prostate)     Patient location during evaluation: PACU Anesthesia Type: MAC Level of consciousness: awake and alert Pain management: pain level controlled Vital Signs Assessment: post-procedure vital signs reviewed and stable Respiratory status: spontaneous breathing, nonlabored ventilation, respiratory function stable and patient connected to nasal cannula oxygen Cardiovascular status: stable and blood pressure returned to baseline Postop Assessment: no apparent nausea or vomiting Anesthetic complications: no   No notable events documented.  Last Vitals:  Vitals:   11/16/22 0900 11/16/22 0933  BP: 137/81 (!) 141/87  Pulse: (!) 58 63  Resp: 10 16  Temp:  36.5 C  SpO2: 98% 100%    Last Pain:  Vitals:   11/16/22 0933  TempSrc:   PainSc: 0-No pain                 [name]

## 2022-11-20 DIAGNOSIS — C61 Malignant neoplasm of prostate: Secondary | ICD-10-CM | POA: Diagnosis not present

## 2022-11-23 DIAGNOSIS — C61 Malignant neoplasm of prostate: Secondary | ICD-10-CM | POA: Diagnosis not present

## 2022-12-05 DIAGNOSIS — C61 Malignant neoplasm of prostate: Secondary | ICD-10-CM | POA: Diagnosis not present

## 2022-12-06 DIAGNOSIS — C61 Malignant neoplasm of prostate: Secondary | ICD-10-CM | POA: Diagnosis not present

## 2022-12-08 DIAGNOSIS — C61 Malignant neoplasm of prostate: Secondary | ICD-10-CM | POA: Diagnosis not present

## 2022-12-11 DIAGNOSIS — C61 Malignant neoplasm of prostate: Secondary | ICD-10-CM | POA: Diagnosis not present

## 2022-12-13 DIAGNOSIS — C61 Malignant neoplasm of prostate: Secondary | ICD-10-CM | POA: Diagnosis not present

## 2022-12-15 DIAGNOSIS — C61 Malignant neoplasm of prostate: Secondary | ICD-10-CM | POA: Diagnosis not present

## 2023-01-09 ENCOUNTER — Ambulatory Visit (INDEPENDENT_AMBULATORY_CARE_PROVIDER_SITE_OTHER): Payer: Medicare HMO | Admitting: Family Medicine

## 2023-01-09 VITALS — BP 110/80 | Wt 174.4 lb

## 2023-01-09 DIAGNOSIS — Z8673 Personal history of transient ischemic attack (TIA), and cerebral infarction without residual deficits: Secondary | ICD-10-CM | POA: Diagnosis not present

## 2023-01-09 DIAGNOSIS — R498 Other voice and resonance disorders: Secondary | ICD-10-CM

## 2023-01-09 DIAGNOSIS — N289 Disorder of kidney and ureter, unspecified: Secondary | ICD-10-CM

## 2023-01-09 DIAGNOSIS — R29898 Other symptoms and signs involving the musculoskeletal system: Secondary | ICD-10-CM | POA: Diagnosis not present

## 2023-01-09 DIAGNOSIS — Z0000 Encounter for general adult medical examination without abnormal findings: Secondary | ICD-10-CM | POA: Diagnosis not present

## 2023-01-09 DIAGNOSIS — N1832 Chronic kidney disease, stage 3b: Secondary | ICD-10-CM | POA: Diagnosis not present

## 2023-01-09 DIAGNOSIS — Z13220 Encounter for screening for lipoid disorders: Secondary | ICD-10-CM | POA: Diagnosis not present

## 2023-01-09 DIAGNOSIS — Z79899 Other long term (current) drug therapy: Secondary | ICD-10-CM | POA: Diagnosis not present

## 2023-01-09 DIAGNOSIS — N529 Male erectile dysfunction, unspecified: Secondary | ICD-10-CM | POA: Diagnosis not present

## 2023-01-09 DIAGNOSIS — C61 Malignant neoplasm of prostate: Secondary | ICD-10-CM

## 2023-01-09 DIAGNOSIS — Z23 Encounter for immunization: Secondary | ICD-10-CM | POA: Diagnosis not present

## 2023-01-09 MED ORDER — SILDENAFIL CITRATE 100 MG PO TABS: 100.0000 mg | ORAL_TABLET | Freq: Every day | ORAL | 1 refills | Status: AC | PRN

## 2023-01-09 NOTE — Progress Notes (Signed)
   Subjective:    Patient ID: [name], male    DOB: 04-27-1951, 72 y.o.   MRN: [id number]  Hypertension This is a chronic problem. Risk factors for coronary artery disease include male gender.  Need for vaccination - Plan: Pneumococcal conjugate vaccine 20-valent  Renal insufficiency  Left leg weakness  History of stroke  Weakness of voice  Malignant neoplasm of prostate ([place]), Chronic  Patient has history of renal insufficiency that goes back to his COVID Need to do an up-to-date lab work Patient also has left leg weakness as well as history of stroke related to [place] Also with weakness of voice related to [place] Patient with history of malignant prostate cancer being treated with radioactive seeds  Patient states he is doing good no problems or concerns  Review of Systems     Objective:   Physical Exam  General-in no acute distress Eyes-no discharge Lungs-respiratory rate normal, CTA CV-no murmurs,RRR Extremities skin warm dry no edema Neuro grossly normal Behavior normal, alert       Assessment & Plan:  1. Need for vaccination Today - Pneumococcal conjugate vaccine 20-valent  2. Renal insufficiency Check lab work lab already ordered await results  3. Left leg weakness Related to his stroke walks with a cane no recent falls  4. History of stroke History of stroke triggered by COVID several years ago  5. Weakness of voice Weakness related to stroke no dysphagia  6. Malignant neoplasm of prostate ([employer]) Followed by specialist had radiation seeds doing well with this Erectile dysfunction-prescription for medication was sent in Blood pressure doing well

## 2023-01-10 LAB — BASIC METABOLIC PANEL
BUN/Creatinine Ratio: 11 (ref 10–24)
BUN: 25 mg/dL (ref 8–27)
CO2: 23 mmol/L (ref 20–29)
Calcium: 9.8 mg/dL (ref 8.6–10.2)
Chloride: 108 mmol/L — ABNORMAL HIGH (ref 96–106)
Creatinine, Ser: 2.26 mg/dL — ABNORMAL HIGH (ref 0.76–1.27)
Glucose: 89 mg/dL (ref 70–99)
Potassium: 4.9 mmol/L (ref 3.5–5.2)
Sodium: 144 mmol/L (ref 134–144)
eGFR: 30 mL/min/{1.73_m2} — ABNORMAL LOW (ref >59)

## 2023-01-10 LAB — HEPATIC FUNCTION PANEL
ALT: 8 IU/L (ref 0–44)
AST: 14 IU/L (ref 0–40)
Albumin: 4.8 g/dL (ref 3.9–4.9)
Alkaline Phosphatase: 41 IU/L — ABNORMAL LOW (ref 44–121)
Bilirubin Total: 0.3 mg/dL (ref 0.0–1.2)
Bilirubin, Direct: 0.11 mg/dL (ref 0.00–0.40)
Total Protein: 7.5 g/dL (ref 6.0–8.5)

## 2023-01-10 LAB — CBC WITH DIFFERENTIAL/PLATELET
Basophils Absolute: 0 10*3/uL (ref 0.0–0.2)
Basos: 0 %
EOS (ABSOLUTE): 0.1 10*3/uL (ref 0.0–0.4)
Eos: 3 %
Hematocrit: 39.6 % (ref 37.5–51.0)
Hemoglobin: 13 g/dL (ref 13.0–17.7)
Immature Grans (Abs): 0 10*3/uL (ref 0.0–0.1)
Immature Granulocytes: 0 %
Lymphocytes Absolute: 0.8 10*3/uL (ref 0.7–3.1)
Lymphs: 29 %
MCH: 30.8 pg (ref 26.6–33.0)
MCHC: 32.8 g/dL (ref 31.5–35.7)
MCV: 94 fL (ref 79–97)
Monocytes Absolute: 0.2 10*3/uL (ref 0.1–0.9)
Monocytes: 8 %
Neutrophils Absolute: 1.6 10*3/uL (ref 1.4–7.0)
Neutrophils: 60 %
Platelets: 203 10*3/uL (ref 150–450)
RBC: 4.22 x10E6/uL (ref 4.14–5.80)
RDW: 13.5 % (ref 11.6–15.4)
WBC: 2.6 10*3/uL — ABNORMAL LOW (ref 3.4–10.8)

## 2023-01-10 LAB — LIPID PANEL
Chol/HDL Ratio: 4 ratio (ref 0.0–5.0)
Cholesterol, Total: 222 mg/dL — ABNORMAL HIGH (ref 100–199)
HDL: 56 mg/dL (ref >39)
LDL Chol Calc (NIH): 152 mg/dL — ABNORMAL HIGH (ref 0–99)
Triglycerides: 78 mg/dL (ref 0–149)
VLDL Cholesterol Cal: 14 mg/dL (ref 5–40)

## 2023-01-10 LAB — MICROALBUMIN / CREATININE URINE RATIO
Creatinine, Urine: 295.8 mg/dL
Microalb/Creat Ratio: 8 mg/g creat (ref 0–29)
Microalbumin, Urine: 23.3 ug/mL

## 2023-01-12 DIAGNOSIS — C61 Malignant neoplasm of prostate: Secondary | ICD-10-CM | POA: Diagnosis not present

## 2023-01-26 ENCOUNTER — Ambulatory Visit (INDEPENDENT_AMBULATORY_CARE_PROVIDER_SITE_OTHER): Payer: Medicare HMO

## 2023-01-26 VITALS — BP 132/74 | Ht 70.5 in | Wt 174.0 lb

## 2023-01-26 DIAGNOSIS — Z0000 Encounter for general adult medical examination without abnormal findings: Secondary | ICD-10-CM | POA: Diagnosis not present

## 2023-01-26 NOTE — Progress Notes (Signed)
Subjective:   [name] is a 72 y.o. male who presents for Medicare Annual/Subsequent preventive examination.  Review of Systems     Cardiac Risk Factors include: advanced age (>53men, >59 women);dyslipidemia;hypertension;male gender;sedentary lifestyle     Objective:    Today's Vitals   01/26/23 1331  BP: 132/74  Weight: 174 lb (78.9 kg)  Height: 5' 10.5" (1.791 m)   Body mass index is 24.61 kg/m.     01/26/2023    1:55 PM 09/22/2022    1:38 PM 01/10/2022   10:36 AM 05/25/2021   11:37 AM 04/18/2021    2:38 PM 11/28/2019    4:44 PM 11/27/2019    3:45 PM  Advanced Directives  Does Patient Have a Medical Advance Directive? Yes No Yes No No No No  Type of Advance Directive Out of facility DNR (pink MOST or yellow form)  Healthcare Power of [place];Living will      Does patient want to make changes to medical advance directive? Yes (MAU/Ambulatory/Procedural Areas - Information given)      No - Patient declined  Copy of Healthcare Power of Attorney in Chart?   Yes - validated most recent copy scanned in chart (See row information)      Would patient like information on creating a medical advance directive?     No - Patient declined  No - Patient declined    Current Medications (verified) Outpatient Encounter Medications as of 01/26/2023  Medication Sig   Carboxymethylcellulose Sodium (DRY EYE RELIEF OP) Apply to eye as needed.   Cholecalciferol (VITAMIN D3 SUPER STRENGTH) 50 MCG (2000 UT) CAPS Take 2,000 Units by mouth daily.   cyanocobalamin (VITAMIN B12) 1000 MCG tablet Take 1,000 mcg by mouth daily.   diazepam (VALIUM) 5 MG tablet 1/2 to 1 bid prn for excessive muscle twitch (Patient taking differently: Take 2.5 mg by mouth daily as needed (excessive muscle twitch).)   methocarbamol (ROBAXIN) 500 MG tablet Take 500 mg by mouth 3 (three) times daily as needed for muscle spasms.   sildenafil (VIAGRA) 100 MG tablet Take 1 tablet (100 mg total) by mouth daily as needed for  erectile dysfunction.   No facility-administered encounter medications on file as of 01/26/2023.    Allergies (verified) Hydrocodone   History: Past Medical History:  Diagnosis Date   Anemia associated with chronic renal failure    BPH (benign prostatic hyperplasia)    CKD (chronic kidney disease), stage IV 07/2019   followed by pcp/   nephrologist---  Dr.Kruska --  (11-10-2022  requested and received via fax lov 03-25-2021, to be scanned in epic after surgery 11-16-2022,  per note as needed basis)  10/ 2020 severe Covid w/ ARF , was on dialysis while in the hospital currently not on dialysis   Complete paralysis of right vocal cord    ENT in care everywhere note 07-13-2021   ED (erectile dysfunction)    Foot drop, left    residual CVA 11/ 2020  uses AFO brace,  occasionally uses cane   Hemiparesis affecting left side as late effect of stroke 08/25/2019   per last neurology office note 09-23-2020  continued improvement LUE weakness, left ankle dorsiflexion weakness (foot drop), mild imbalance   Hiatal hernia    History of anoxic brain injury 07/25/2019   in setting ARDS /  severe covid ,  EMS to ED ([facility])  O2 sat 48%  (bilateral frontal)  due to hypoxia/ hypotension   History of cerebrovascular accident (CVA) involving  cerebellum 08/26/2019   in setting ARDS/ sev covid;  etiology due to hypotension secondary to dialysis & sepsis//   MRI acute-early subacute infacrts involving bilateral cerebral cortex and right cerebellar w/ residual left hemiparesis, imbalance w/ gait impairment  (neurologist--- dr Merlene Pulling in epic 09-22-2020 no follow since on ASA 81mg  for prevention)   History of gastroesophageal reflux (GERD)    11-14-2022  pt stated no issue since retired   History of GI bleed 08/10/2019   in setting ARDS w/ sev covid was on ASA 325 mg;   EDG w/ hemostatis and clipping of bleeding gastric ulcer   History of Helicobacter pylori infection 08/10/2019   EGD bx, treated (in setting  upper gi bleed due to asa)   History of hemodialysis 07/2019   in setting ARDS /  sev covid   History of severe acute respiratory syndrome coronavirus 2 (SARS-CoV-2) disease 07/25/2019   dx positive covid 07-23-2019;  ED by EMS 07-25-2019 O2 sat 48%, intubated, AKI ;  transfered to [facility] (covid hospital) long admission VDRF/ MSSA bacterium/ episodes AFib w/ RVR/  CVA right cellebar /  CRRT to HD/  pressure ulcers/ decanulated 08-22-2019;  admitted to rehab 10-03-2019 debility, d/c'd 11-07-2019   History of tracheostomy 07/2019   History of traumatic subdural hematoma 11/27/2019   concussion without loc , traumatic SDH post fall 11-27-2019,  per neurologist note 09-22-2020 in epic resolved   Imbalance due to old stroke 08/2019   residual imbalance post cva , uses cane occasionally   Malignant neoplasm prostate 05/2022   urologsit--- dr borden/  radiation oncologist--- dr Kathrynn Running--  dx 08/ 2023,  Gleason 3+4   PAF (paroxysmal atrial fibrillation) 07/2019   episodes w/ AFib w/ RVR in setting ARDS w/ severe covid  (followed by pcp)   Past Surgical History:  Procedure Laterality Date   ACHILLES TENDON SURGERY Right 02/24/2011   @[facility]  by dr Izetta Dakin VEIN TRANSPOSITION  10/13/2019   Procedure: Basilic Vein Transposition Left Arm;  Surgeon: Chuck Hint, MD;  Location: [facility] OR;  Service: Vascular;;   COLONOSCOPY  2009   [facility]   ESOPHAGOGASTRODUODENOSCOPY N/A 12/25/2014   Procedure: ESOPHAGOGASTRODUODENOSCOPY (EGD);  Surgeon: West Bali, MD;  Location: [facility];  Service: Endoscopy;  Laterality: N/A;  830am   ESOPHAGOGASTRODUODENOSCOPY N/A 08/09/2019   Procedure: ESOPHAGOGASTRODUODENOSCOPY (EGD);  Surgeon: Bernette Redbird, MD;  Location: [facility];  Service: Endoscopy;  Laterality: N/A;  Patient is already sedated, so I do not anticipate need for further sedation.  Okay from my standpoint to do either at the bedside or in the operating room   GOLD SEED IMPLANT N/A 11/16/2022    Procedure: GOLD SEED IMPLANT;  Surgeon: Heloise Purpura, MD;  Location: [facility];  Service: Urology;  Laterality: N/A;  30 MINUTES NEEDED FOR CASE   HEMOSTASIS CLIP PLACEMENT  08/09/2019   Procedure: HEMOSTASIS CLIP PLACEMENT;  Surgeon: Bernette Redbird, MD;  Location: [facility];  Service: Endoscopy;;   HEMOSTASIS CONTROL  08/09/2019   Procedure: HEMOSTASIS CONTROL;  Surgeon: Bernette Redbird, MD;  Location: [facility];  Service: Endoscopy;;  epi    IR FLUORO GUIDE CV LINE LEFT  09/02/2019   IR REMOVAL TUN CV CATH W/O FL  10/29/2019   IR US GUIDE VASC ACCESS LEFT  09/02/2019   KNEE SURGERY Left 1980   per pt repair ligament   LIGATION OF COMPETING BRANCHES OF ARTERIOVENOUS FISTULA Left 04/21/2021   Procedure: LIGATION OF LEFT ARM  ARTERIOVENOUS BRACHIOBASILIC FISTULA;  Surgeon: Larina Earthly, MD;  Location: [facility];  Service: Vascular;  Laterality: Left;   SPACE OAR INSTILLATION N/A 11/16/2022   Procedure: SPACE OAR INSTILLATION;  Surgeon: Heloise Purpura, MD;  Location: [facility];  Service: Urology;  Laterality: N/A;   TONSILLECTOMY     age 9   WRIST SURGERY Left 1970   per pt repair torn ligament   Family History  Problem Relation Age of Onset   Colon cancer Neg Hx    Colon polyps Neg Hx    Social History   Socioeconomic History   Marital status: Married    Spouse name: Aurea Graff   Number of children: 2   Years of education: bachelors   Highest education level: Not on file  Occupational History   Occupation: Investment banker, corporate: [employer]  Tobacco Use   Smoking status: Never   Smokeless tobacco: Never  Vaping Use   Vaping Use: Never used  Substance and Sexual Activity   Alcohol use: Not Currently    Alcohol/week: 2.0 standard drinks of alcohol    Types: 2 Shots of liquor per week    Comment: moderate   Drug use: Never   Sexual activity: Not on file  Other Topics Concern   Not on file  Social History Narrative   Retired Community education officer    2 grown sons.    Social Determinants of Health   Financial Resource Strain: Low Risk  (01/26/2023)   Overall Financial Resource Strain (CARDIA)    Difficulty of Paying Living Expenses: Not hard at all  Food Insecurity: No Food Insecurity (01/26/2023)   Hunger Vital Sign    Worried About Running Out of Food in the Last Year: Never true    Ran Out of Food in the Last Year: Never true  Transportation Needs: No Transportation Needs (01/26/2023)   PRAPARE - Administrator, Civil Service (Medical): No    Lack of Transportation (Non-Medical): No  Physical Activity: Insufficiently Active (01/26/2023)   Exercise Vital Sign    Days of Exercise per Week: 3 days    Minutes of Exercise per Session: 30 min  Stress: No Stress Concern Present (01/26/2023)   [employer] of Occupational Health - Occupational Stress Questionnaire    Feeling of Stress : Not at all  Social Connections: Socially Integrated (01/26/2023)   Social Connection and Isolation Panel [NHANES]    Frequency of Communication with Friends and Family: More than three times a week    Frequency of Social Gatherings with Friends and Family: More than three times a week    Attends Religious Services: More than 4 times per year    Active Member of [employer] or Organizations: Yes    Attends Engineer, structural: More than 4 times per year    Marital Status: Married    Tobacco Counseling Counseling given: Not Answered   Clinical Intake:  Pre-visit preparation completed: Yes  Pain : No/denies pain     Diabetes: No  How often do you need to have someone help you when you read instructions, pamphlets, or other written materials from your doctor or pharmacy?: 1 - Never  Diabetic?No   Interpreter Needed?: No  Information entered by :: Kandis Fantasia LPN   Activities of Daily Living    01/26/2023    1:55 PM 11/16/2022    6:57 AM  In your present state of health, do you have any difficulty performing the  following  activities:  Hearing? 0 0  Vision? 0 0  Difficulty concentrating or making decisions? 0 0  Walking or climbing stairs? 1 0  Dressing or bathing? 0 0  Doing errands, shopping? 0   Preparing Food and eating ? N   Using the Toilet? N   In the past six months, have you accidently leaked urine? N   Do you have problems with loss of bowel control? N   Managing your Medications? N   Managing your Finances? N   Housekeeping or managing your Housekeeping? N     Patient Care Team: Babs SciaraLuking, Scott A, MD as PCP - General (Family Medicine) West BaliFields, Sandi L, MD (Inactive) as Consulting Physician (Gastroenterology) Nils PyleMorris, David E, MD as Referring Physician (Radiation Oncology) Heloise PurpuraBorden, Lester, MD as Consulting Physician (Urology)  Indicate any recent Medical Services you may have received from other than [facility] providers in the past year (date may be approximate).     Assessment:   This is a routine wellness examination for [name].  Hearing/Vision screen Hearing Screening - Comments:: Denies hearing difficulties  Vision Screening - Comments:: Wears rx glasses - [place] [facility] [place]    Dietary issues and exercise activities discussed: Current Exercise Habits: Home exercise routine, Type of exercise: walking, Time (Minutes): 30, Frequency (Times/Week): 3, Weekly Exercise (Minutes/Week): 90, Intensity: Mild   Goals Addressed   None    Depression Screen    01/26/2023    1:54 PM 01/09/2023    9:56 AM 01/10/2022   10:26 AM 12/31/2020    1:51 PM  PHQ 2/9 Scores  PHQ - 2 Score 0 0 0 0    Fall Risk    01/26/2023    1:52 PM 01/09/2023    9:55 AM 01/10/2022   10:36 AM 05/25/2021   11:37 AM 12/31/2020    1:51 PM  Fall Risk   Falls in the past year? 1 1 0 0   Number falls in past yr: 0 0 0    Injury with Fall? 0 0 0    Risk for fall due to : No Fall Risks;Impaired mobility;Impaired balance/gait History of fall(s);Impaired balance/gait;Impaired  mobility Impaired balance/gait;Impaired mobility    Follow up Falls prevention discussed;Education provided;Falls evaluation completed Falls evaluation completed Falls prevention discussed  Falls evaluation completed    FALL RISK PREVENTION PERTAINING TO THE HOME:  Any stairs in or around the home? No  If so, are there any without handrails? No  Home free of loose throw rugs in walkways, pet beds, electrical cords, etc? Yes  Adequate lighting in your home to reduce risk of falls? Yes   ASSISTIVE DEVICES UTILIZED TO PREVENT FALLS:  Life alert? No  Use of a cane, walker or w/c? No  Grab bars in the bathroom? Yes  Shower chair or bench in shower? No  Elevated toilet seat or a handicapped toilet? Yes   TIMED UP AND GO:  Was the test performed? Yes .  Length of time to ambulate 10 feet: 7 sec.   Gait slow and steady without use of assistive device  Cognitive Function:        01/26/2023    1:56 PM 01/10/2022   10:44 AM  6CIT Screen  What Year? 0 points 0 points  What month? 0 points 0 points  What time? 0 points 3 points  Count back from 20 0 points 0 points  Months in reverse 0 points 0 points  Repeat phrase 0 points  0 points  Total Score 0 points 3 points    Immunizations Immunization History  Administered Date(s) Administered   Influenza, High Dose Seasonal PF 07/12/2022   Influenza-Unspecified [birth year]   Moderna Covid-19 Vaccine Bivalent Booster 3yrs & up 07/12/2022   Moderna Sars-Covid-2 Vaccination 01/03/2020, 02/04/2020   PNEUMOCOCCAL CONJUGATE-20 01/09/2023   Zoster Recombinat (Shingrix) 11/30/2022    TDAP status: Due, Education has been provided regarding the importance of this vaccine. Advised may receive this vaccine at local pharmacy or Health Dept. Aware to provide a copy of the vaccination record if obtained from local pharmacy or Health Dept. Verbalized acceptance and understanding.  Flu Vaccine status: Up to date  Pneumococcal vaccine status: Up to  date  Covid-19 vaccine status: Information provided on how to obtain vaccines.   Qualifies for Shingles Vaccine? Yes   Zostavax completed No   Shingrix Completed?: No.    Education has been provided regarding the importance of this vaccine. Patient has been advised to call insurance company to determine out of pocket expense if they have not yet received this vaccine. Advised may also receive vaccine at local pharmacy or Health Dept. Verbalized acceptance and understanding.  Screening Tests Health Maintenance  Topic Date Due   Hepatitis C Screening  Never done   DTaP/Tdap/Td (1 - Tdap) Never done   COVID-19 Vaccine (4 - 2023-24 season) 09/06/2022   Zoster Vaccines- Shingrix (2 of 2) 01/25/2023   COLONOSCOPY (Pts 45-39yrs Insurance coverage will need to be confirmed)  01/26/2024 (Originally 09/27/1997)   INFLUENZA VACCINE  05/24/2023   COLON CANCER SCREENING ANNUAL FOBT  08/25/2023   Pneumonia Vaccine 36+ Years old  Completed   HPV VACCINES  Aged Out    Health Maintenance  Health Maintenance Due  Topic Date Due   Hepatitis C Screening  Never done   DTaP/Tdap/Td (1 - Tdap) Never done   COVID-19 Vaccine (4 - 2023-24 season) 09/06/2022   Zoster Vaccines- Shingrix (2 of 2) 01/25/2023    Colorectal cancer screening: Type of screening: FOBT/FIT. Completed 08/24/22. Repeat every 1 years  Lung Cancer Screening: (Low Dose CT Chest recommended if Age 3-80 years, 30 pack-year currently smoking OR have quit w/in 15years.) does not qualify.   Lung Cancer Screening Referral: n/a  Additional Screening:  Hepatitis C Screening: does qualify;  Vision Screening: Recommended annual ophthalmology exams for early detection of glaucoma and other disorders of the eye. Is the patient up to date with their annual eye exam?  Yes  Who is the provider or what is the name of the office in which the patient attends annual eye exams? [facility] If pt is not established with a provider, would  they like to be referred to a provider to establish care? No .   Dental Screening: Recommended annual dental exams for proper oral hygiene  Community Resource Referral / Chronic Care Management: CRR required this visit?  No   CCM required this visit?  No      Plan:     I have personally reviewed and noted the following in the patient's chart:   Medical and social history Use of alcohol, tobacco or illicit drugs  Current medications and supplements including opioid prescriptions. Patient is not currently taking opioid prescriptions. Functional ability and status Nutritional status Physical activity Advanced directives List of other physicians Hospitalizations, surgeries, and ER visits in previous 12 months Vitals Screenings to include cognitive, depression, and falls Referrals and appointments  In addition, I have reviewed and discussed with  patient certain preventive protocols, quality metrics, and best practice recommendations. A written personalized care plan for preventive services as well as general preventive health recommendations were provided to patient.     Kandis Fantasia [place], [place]   04/25/9448   Nurse Notes: No concerns; information provided on cholesterol content in foods

## 2023-01-26 NOTE — Patient Instructions (Addendum)
[name] , Thank you for taking time to come for your Medicare Wellness Visit. I appreciate your ongoing commitment to your health goals. Please review the following plan we discussed and let me know if I can assist you in the future.   These are the goals we discussed:  Goals      Exercise 3x per week (30 min per time)     Continue to exercise and stay healthy        This is a list of the screening recommended for you and due dates:  Health Maintenance  Topic Date Due   Hepatitis C Screening: USPSTF Recommendation to screen - Ages 8-79 yo.  Never done   DTaP/Tdap/Td vaccine (1 - Tdap) Never done   COVID-19 Vaccine (4 - 2023-24 season) 09/06/2022   Zoster (Shingles) Vaccine (2 of 2) 01/25/2023   Colon Cancer Screening  01/26/2024*   Flu Shot  05/24/2023   Stool Blood Test  08/25/2023   Pneumonia Vaccine  Completed   HPV Vaccine  Aged Out  *Topic was postponed. The date shown is not the original due date.    Advanced directives: Advance directive discussed with you today. I have provided a copy for you to complete at home and have notarized. Once this is complete please bring a copy in to our office so we can scan it into your chart.    Conditions/risks identified: Aim for 30 minutes of exercise or brisk walking, 6-8 glasses of water, and 5 servings of fruits and vegetables each day.   Next appointment: Follow up in one year for your annual wellness visit.   Preventive Care 39 Years and Older, Male  Preventive care refers to lifestyle choices and visits with your health care provider that can promote health and wellness. What does preventive care include? A yearly physical exam. This is also called an annual well check. Dental exams once or twice a year. Routine eye exams. Ask your health care provider how often you should have your eyes checked. Personal lifestyle choices, including: Daily care of your teeth and gums. Regular physical activity. Eating a healthy  diet. Avoiding tobacco and drug use. Limiting alcohol use. Practicing safe sex. Taking low doses of aspirin every day. Taking vitamin and mineral supplements as recommended by your health care provider. What happens during an annual well check? The services and screenings done by your health care provider during your annual well check will depend on your age, overall health, lifestyle risk factors, and family history of disease. Counseling  Your health care provider may ask you questions about your: Alcohol use. Tobacco use. Drug use. Emotional well-being. Home and relationship well-being. Sexual activity. Eating habits. History of falls. Memory and ability to understand (cognition). Work and work Astronomer. Screening  You may have the following tests or measurements: Height, weight, and BMI. Blood pressure. Lipid and cholesterol levels. These may be checked every 5 years, or more frequently if you are over 30 years old. Skin check. Lung cancer screening. You may have this screening every year starting at age 29 if you have a 30-pack-year history of smoking and currently smoke or have quit within the past 15 years. Fecal occult blood test (FOBT) of the stool. You may have this test every year starting at age 13. Flexible sigmoidoscopy or colonoscopy. You may have a sigmoidoscopy every 5 years or a colonoscopy every 10 years starting at age 29. Prostate cancer screening. Recommendations will vary depending on your family history and other  risks. Hepatitis C blood test. Hepatitis B blood test. Sexually transmitted disease (STD) testing. Diabetes screening. This is done by checking your blood sugar (glucose) after you have not eaten for a while (fasting). You may have this done every 1-3 years. Abdominal aortic aneurysm (AAA) screening. You may need this if you are a current or former smoker. Osteoporosis. You may be screened starting at age 28 if you are at high risk. Talk with  your health care provider about your test results, treatment options, and if necessary, the need for more tests. Vaccines  Your health care provider may recommend certain vaccines, such as: Influenza vaccine. This is recommended every year. Tetanus, diphtheria, and acellular pertussis (Tdap, Td) vaccine. You may need a Td booster every 10 years. Zoster vaccine. You may need this after age 74. Pneumococcal 13-valent conjugate (PCV13) vaccine. One dose is recommended after age 28. Pneumococcal polysaccharide (PPSV23) vaccine. One dose is recommended after age 60. Talk to your health care provider about which screenings and vaccines you need and how often you need them. This information is not intended to replace advice given to you by your health care provider. Make sure you discuss any questions you have with your health care provider. Document Released: 11/05/2015 Document Revised: 06/28/2016 Document Reviewed: 08/10/2015 Elsevier Interactive Patient Education  2017 [place] Prevention in the Home Falls can cause injuries. They can happen to people of all ages. There are many things you can do to make your home safe and to help prevent falls. What can I do on the outside of my home? Regularly fix the edges of walkways and driveways and fix any cracks. Remove anything that might make you trip as you walk through a door, such as a raised step or threshold. Trim any bushes or trees on the path to your home. Use bright outdoor lighting. Clear any walking paths of anything that might make someone trip, such as rocks or tools. Regularly check to see if handrails are loose or broken. Make sure that both sides of any steps have handrails. Any raised decks and porches should have guardrails on the edges. Have any leaves, snow, or ice cleared regularly. Use sand or salt on walking paths during winter. Clean up any spills in your garage right away. This includes oil or grease spills. What  can I do in the bathroom? Use night lights. Install grab bars by the toilet and in the tub and shower. Do not use towel bars as grab bars. Use non-skid mats or decals in the tub or shower. If you need to sit down in the shower, use a plastic, non-slip stool. Keep the floor dry. Clean up any water that spills on the floor as soon as it happens. Remove soap buildup in the tub or shower regularly. Attach bath mats securely with double-sided non-slip rug tape. Do not have throw rugs and other things on the floor that can make you trip. What can I do in the bedroom? Use night lights. Make sure that you have a light by your bed that is easy to reach. Do not use any sheets or blankets that are too big for your bed. They should not hang down onto the floor. Have a firm chair that has side arms. You can use this for support while you get dressed. Do not have throw rugs and other things on the floor that can make you trip. What can I do in the kitchen? Clean up any spills right away.  Avoid walking on wet floors. Keep items that you use a lot in easy-to-reach places. If you need to reach something above you, use a strong step stool that has a grab bar. Keep electrical cords out of the way. Do not use floor polish or wax that makes floors slippery. If you must use wax, use non-skid floor wax. Do not have throw rugs and other things on the floor that can make you trip. What can I do with my stairs? Do not leave any items on the stairs. Make sure that there are handrails on both sides of the stairs and use them. Fix handrails that are broken or loose. Make sure that handrails are as long as the stairways. Check any carpeting to make sure that it is firmly attached to the stairs. Fix any carpet that is loose or worn. Avoid having throw rugs at the top or bottom of the stairs. If you do have throw rugs, attach them to the floor with carpet tape. Make sure that you have a light switch at the top of the  stairs and the bottom of the stairs. If you do not have them, ask someone to add them for you. What else can I do to help prevent falls? Wear shoes that: Do not have high heels. Have rubber bottoms. Are comfortable and fit you well. Are closed at the toe. Do not wear sandals. If you use a stepladder: Make sure that it is fully opened. Do not climb a closed stepladder. Make sure that both sides of the stepladder are locked into place. Ask someone to hold it for you, if possible. Clearly mark and make sure that you can see: Any grab bars or handrails. First and last steps. Where the edge of each step is. Use tools that help you move around (mobility aids) if they are needed. These include: Canes. Walkers. Scooters. Crutches. Turn on the lights when you go into a dark area. Replace any light bulbs as soon as they burn out. Set up your furniture so you have a clear path. Avoid moving your furniture around. If any of your floors are uneven, fix them. If there are any pets around you, be aware of where they are. Review your medicines with your doctor. Some medicines can make you feel dizzy. This can increase your chance of falling. Ask your doctor what other things that you can do to help prevent falls. This information is not intended to replace advice given to you by your health care provider. Make sure you discuss any questions you have with your health care provider. Document Released: 08/05/2009 Document Revised: 03/16/2016 Document Reviewed: 11/13/2014 Elsevier Interactive Patient Education  2017 [employer].

## 2023-01-29 ENCOUNTER — Ambulatory Visit (INDEPENDENT_AMBULATORY_CARE_PROVIDER_SITE_OTHER): Payer: Medicare HMO | Admitting: Family Medicine

## 2023-01-29 DIAGNOSIS — Z79899 Other long term (current) drug therapy: Secondary | ICD-10-CM

## 2023-01-29 DIAGNOSIS — E785 Hyperlipidemia, unspecified: Secondary | ICD-10-CM | POA: Diagnosis not present

## 2023-01-29 DIAGNOSIS — N1832 Chronic kidney disease, stage 3b: Secondary | ICD-10-CM | POA: Diagnosis not present

## 2023-01-29 MED ORDER — ATORVASTATIN CALCIUM 20 MG PO TABS: 20.0000 mg | ORAL_TABLET | Freq: Every day | ORAL | 1 refills | Status: DC

## 2023-01-29 NOTE — Progress Notes (Signed)
   Subjective:    Patient ID: [name], male    DOB: [birth year], 73 y.o.   MRN: [id number]  Patient visit to discuss cholesterol. I connected with  Levora Dredge on 01/29/23 by a video enabled telemedicine application and verified that I am speaking with the correct person using two identifiers.   I discussed the limitations of evaluation and management by telemedicine. The patient expressed understanding and agreed to proceed. We did discuss atherosclerosis risk as well as heart attack risk he has a previous history of stroke that was related to COVID Patient at risk for having more strokes To continue current medication Patient agrees to restart cholesterol medicine Patient is aware of his CKD but does not want to see nephrology currently he states he wants to continue to try to eat healthy and stay hydrated we will do follow-up labs in the future Review of Systems     Objective:   Physical Exam The ASCVD Risk score (Arnett DK, et al., 2019) failed to calculate for the following reasons:   The patient has a prior MI or stroke diagnosis  15 minutes spent with patient discussing issues over the phone Video not capable     Assessment & Plan:  Telephone call Video not available Lipid profile discussed Patient agrees to restart atorvastatin 20 mg daily Will do lab work again in 8 weeks time He will also do a follow-up visit in the fall he does not want to do consultation with nephrology at this point

## 2023-04-13 ENCOUNTER — Ambulatory Visit (HOSPITAL_COMMUNITY)
Admission: RE | Admit: 2023-04-13 | Discharge: 2023-04-13 | Disposition: A | Discharge status: Home / Self Care | Payer: Medicare HMO | Source: Ambulatory Visit | Attending: Family Medicine | Admitting: Family Medicine

## 2023-04-13 ENCOUNTER — Encounter: Payer: Self-pay | Admitting: Family Medicine

## 2023-04-13 ENCOUNTER — Ambulatory Visit (INDEPENDENT_AMBULATORY_CARE_PROVIDER_SITE_OTHER): Payer: Medicare HMO | Admitting: Family Medicine

## 2023-04-13 VITALS — BP 128/85 | HR 71 | Temp 98.1°F | Ht 70.5 in | Wt 172.2 lb

## 2023-04-13 DIAGNOSIS — R059 Cough, unspecified: Secondary | ICD-10-CM

## 2023-04-13 MED ORDER — ALBUTEROL SULFATE HFA 108 (90 BASE) MCG/ACT IN AERS: 2.0000 | INHALATION_SPRAY | Freq: Four times a day (QID) | RESPIRATORY_TRACT | 0 refills | Status: AC | PRN

## 2023-04-13 MED ORDER — PANTOPRAZOLE SODIUM 40 MG PO TBEC: 40.0000 mg | DELAYED_RELEASE_TABLET | Freq: Every day | ORAL | 0 refills | Status: AC

## 2023-04-13 NOTE — Patient Instructions (Signed)
Chest xray today.  We will call with results.  Follow up in 2 weeks with Dr. Lorin Picket

## 2023-04-13 NOTE — Progress Notes (Signed)
Subjective:  Patient ID: [name], male    DOB: 1950/12/30  Age: 72 y.o. MRN: [id number]  CC: Chief Complaint  Patient presents with   Cough    Pt states that he has a rattle in his chest when breathing and mucus draining    HPI:  72 year old male presents for evaluation of the above.  2 to 3 week history of cough.  He states that he has had some rattling in his chest particular when lying down.  No significant shortness of breath.  He is staying fairly active.  He walks approximately 1 mile a day.  No fever.  Cough is slightly productive.  No medications or interventions tried.  No relieving factors.  Patient Active Problem List   Diagnosis Date Noted   Cough 04/13/2023   Malignant neoplasm of prostate (HCC) 09/22/2022   CKD (chronic kidney disease), stage IV (HCC) 12/21/2019   Sleep disturbance    Anemia of chronic disease    Cerebral thrombosis with cerebral infarction 08/28/2019   Constipation 08/09/2019   BPH (benign prostatic hyperplasia) 10/31/2013   S/P Achilles tendon repair 04/05/2011    Social Hx   Social History   Socioeconomic History   Marital status: Married    Spouse name: [name]   Number of children: 2   Years of education: bachelors   Highest education level: Not on file  Occupational History   Occupation: Investment banker, corporate: [employer]  Tobacco Use   Smoking status: Never   Smokeless tobacco: Never  Vaping Use   Vaping Use: Never used  Substance and Sexual Activity   Alcohol use: Not Currently    Alcohol/week: 2.0 standard drinks of alcohol    Types: 2 Shots of liquor per week    Comment: moderate   Drug use: Never   Sexual activity: Not on file  Other Topics Concern   Not on file  Social History Narrative   Retired Community education officer   2 grown sons.    Social Determinants of Health   Financial Resource Strain: Low Risk  (01/26/2023)   Overall Financial Resource Strain (CARDIA)    Difficulty of Paying Living Expenses: Not hard at all   Food Insecurity: No Food Insecurity (01/26/2023)   Hunger Vital Sign    Worried About Running Out of Food in the Last Year: Never true    Ran Out of Food in the Last Year: Never true  Transportation Needs: No Transportation Needs (01/26/2023)   PRAPARE - Administrator, Civil Service (Medical): No    Lack of Transportation (Non-Medical): No  Physical Activity: Insufficiently Active (01/26/2023)   Exercise Vital Sign    Days of Exercise per Week: 3 days    Minutes of Exercise per Session: 30 min  Stress: No Stress Concern Present (01/26/2023)   [employer] of Occupational Health - Occupational Stress Questionnaire    Feeling of Stress : Not at all  Social Connections: Socially Integrated (01/26/2023)   Social Connection and Isolation Panel [NHANES]    Frequency of Communication with Friends and Family: More than three times a week    Frequency of Social Gatherings with Friends and Family: More than three times a week    Attends Religious Services: More than 4 times per year    Active Member of [employer] or Organizations: Yes    Attends Engineer, structural: More than 4 times per year    Marital Status: Married    Review of Systems  Per HPI  Objective:  BP 128/85   Pulse 71   Temp 98.1 F (36.7 C)   Ht 5' 10.5" (1.791 m)   Wt 172 lb 3.2 oz (78.1 kg)   SpO2 98%   BMI 24.36 kg/m      04/13/2023   10:29 AM 04/13/2023    9:53 AM 01/26/2023    1:31 PM  BP/Weight  Systolic BP 128 142 132  Diastolic BP 85 85 74  Wt. (Lbs)  172.2 174  BMI  24.36 kg/m2 24.61 kg/m2    Physical Exam Vitals and nursing note reviewed.  Constitutional:      General: He is not in acute distress.    Appearance: Normal appearance.  HENT:     Head: Normocephalic and atraumatic.  Eyes:     General:        Right eye: No discharge.        Left eye: No discharge.     Conjunctiva/sclera: Conjunctivae normal.  Cardiovascular:     Rate and Rhythm: Normal rate and regular rhythm.   Pulmonary:     Effort: Pulmonary effort is normal.     Breath sounds: Normal breath sounds. No wheezing or rales.  Neurological:     Mental Status: He is alert.  Psychiatric:        Mood and Affect: Mood normal.        Behavior: Behavior normal.     Lab Results  Component Value Date   WBC 2.6 (L) 01/09/2023   HGB 13.0 01/09/2023   HCT 39.6 01/09/2023   PLT 203 01/09/2023   GLUCOSE 89 01/09/2023   CHOL 222 (H) 01/09/2023   TRIG 78 01/09/2023   HDL 56 01/09/2023   LDLCALC 152 (H) 01/09/2023   ALT 8 01/09/2023   AST 14 01/09/2023   NA 144 01/09/2023   K 4.9 01/09/2023   CL 108 (H) 01/09/2023   CREATININE 2.26 (H) 01/09/2023   BUN 25 01/09/2023   CO2 23 01/09/2023   TSH 1.780 11/08/2017   PSA 3.37 11/20/2014   INR 1.1 09/21/2021   HGBA1C 6.1 (H) 07/26/2019     Assessment & Plan:   Problem List Items Addressed This Visit       Other   Cough - Primary    Chest x-ray obtained and was independent reviewed by me.  Interpretation: Chest x-ray clear. Albuterol as needed.  Placing on Protonix to cover for the possibility of GERD contributing.  Supportive care.      Relevant Orders   DG Chest 2 View (Completed)    Meds ordered this encounter  Medications   pantoprazole (PROTONIX) 40 MG tablet    Sig: Take 1 tablet (40 mg total) by mouth daily.    Dispense:  30 tablet    Refill:  0   albuterol (VENTOLIN HFA) 108 (90 Base) MCG/ACT inhaler    Sig: Inhale 2 puffs into the lungs every 6 (six) hours as needed for wheezing or shortness of breath.    Dispense:  8 g    Refill:  0    Follow-up:  Return in about 2 weeks (around 04/27/2023).  Everlene Other DO [facility]

## 2023-04-13 NOTE — Assessment & Plan Note (Signed)
Chest x-ray obtained and was independent reviewed by me.  Interpretation: Chest x-ray clear. Albuterol as needed.  Placing on Protonix to cover for the possibility of GERD contributing.  Supportive care.

## 2023-05-14 ENCOUNTER — Telehealth: Payer: Self-pay

## 2023-05-14 MED ORDER — ATORVASTATIN CALCIUM 20 MG PO TABS: 20.0000 mg | ORAL_TABLET | Freq: Every day | ORAL | 0 refills | Status: DC

## 2023-05-14 NOTE — Telephone Encounter (Signed)
Received via fax Rx request: Prescription sent electronically to pharmacy  

## 2023-05-14 NOTE — Telephone Encounter (Signed)
Prescription Request  05/14/2023  LOV: Visit date not found  What is the name of the medication or equipment?   atorvastatin (LIPITOR) 20 MG tablet    Have you contacted your pharmacy to request a refill? Yes   Which pharmacy would you like this sent to? [facility]  Patient notified that their request is being sent to the clinical staff for review and that they should receive a response within 2 business days.   Please advise at Mobile [phone] (mobile)

## 2023-05-15 ENCOUNTER — Other Ambulatory Visit: Payer: Self-pay | Admitting: Family Medicine

## 2023-05-15 MED ORDER — OLOPATADINE HCL 0.2 % OP SOLN: 1.0000 [drp] | Freq: Every evening | OPHTHALMIC | 3 refills | Status: AC | PRN

## 2023-05-23 DIAGNOSIS — R8271 Bacteriuria: Secondary | ICD-10-CM | POA: Diagnosis not present

## 2023-05-23 DIAGNOSIS — N401 Enlarged prostate with lower urinary tract symptoms: Secondary | ICD-10-CM | POA: Diagnosis not present

## 2023-05-23 DIAGNOSIS — C61 Malignant neoplasm of prostate: Secondary | ICD-10-CM | POA: Diagnosis not present

## 2023-05-23 DIAGNOSIS — R3912 Poor urinary stream: Secondary | ICD-10-CM | POA: Diagnosis not present

## 2023-07-12 ENCOUNTER — Ambulatory Visit: Payer: Medicare HMO | Admitting: Family Medicine

## 2023-07-12 VITALS — BP 130/80 | HR 84 | Temp 98.2°F | Ht 70.5 in | Wt 175.4 lb

## 2023-07-12 DIAGNOSIS — R2689 Other abnormalities of gait and mobility: Secondary | ICD-10-CM | POA: Diagnosis not present

## 2023-07-12 DIAGNOSIS — R338 Other retention of urine: Secondary | ICD-10-CM

## 2023-07-12 DIAGNOSIS — Z23 Encounter for immunization: Secondary | ICD-10-CM | POA: Diagnosis not present

## 2023-07-12 DIAGNOSIS — D638 Anemia in other chronic diseases classified elsewhere: Secondary | ICD-10-CM

## 2023-07-12 DIAGNOSIS — N1832 Chronic kidney disease, stage 3b: Secondary | ICD-10-CM | POA: Diagnosis not present

## 2023-07-12 DIAGNOSIS — N401 Enlarged prostate with lower urinary tract symptoms: Secondary | ICD-10-CM | POA: Diagnosis not present

## 2023-07-12 DIAGNOSIS — D509 Iron deficiency anemia, unspecified: Secondary | ICD-10-CM | POA: Diagnosis not present

## 2023-07-12 DIAGNOSIS — E785 Hyperlipidemia, unspecified: Secondary | ICD-10-CM | POA: Diagnosis not present

## 2023-07-12 DIAGNOSIS — Z79899 Other long term (current) drug therapy: Secondary | ICD-10-CM

## 2023-07-12 MED ORDER — ATORVASTATIN CALCIUM 20 MG PO TABS: 20.0000 mg | ORAL_TABLET | Freq: Every day | ORAL | 1 refills | Status: DC

## 2023-07-12 NOTE — Progress Notes (Signed)
Subjective:    Patient ID: [name], male    DOB: 10/10/1951, 72 y.o.   MRN: [id number]  HPI Patient arrives today 6 month follow up. Patient states no concerns or issues today. Stage 3b chronic kidney disease (HCC) - Plan: CBC with Differential, Microalbumin/Creatinine Ratio, Urine  Hyperlipidemia, unspecified hyperlipidemia type - Plan: Lipid panel  Renal insufficiency - Plan: Hepatic Function Panel  CKD (chronic kidney disease), stage IV (HCC) - Plan: Microalbumin/Creatinine Ratio, Urine  Benign prostatic hyperplasia with urinary retention  Anemia of chronic disease - Plan: Iron, TIBC and Ferritin Panel  Iron deficiency anemia, unspecified iron deficiency anemia type - Plan: Iron, TIBC and Ferritin Panel  Needs flu shot - Plan: Flu Vaccine Trivalent High Dose (Fluad)  Patient with ongoing weakness on the right side from previous stroke Patient had severe COVID near death in the hospital for many weeks now doing better Patient is disabled from his stroke but manages it as best he can without cane Keeps in good spirits denies being depressed Patient with a history of chronic kidney disease History of anemia of chronic disease History of anemia due to the kidney issues Not following with any specialist currently   Review of Systems     Objective:   Physical Exam  General-in no acute distress Eyes-no discharge Lungs-respiratory rate normal, CTA CV-no murmurs,RRR Extremities skin warm dry no edema Neuro grossly normal Behavior normal, alert Weakness in the right leg noted      Assessment & Plan:  1. Stage 3b chronic kidney disease (HCC) Will check lab work healthy diet regular physical activity patient - CBC with Differential - Microalbumin/Creatinine Ratio, Urine  2. Hyperlipidemia, unspecified hyperlipidemia type Healthy diet.  On statin. - Lipid panel  3. Renal insufficiency We will be checking metabolic 7 inadvertently this did not get ordered we  will add it to the additional orders - Hepatic Function Panel  4. CKD (chronic kidney disease), stage IV (HCC) Await test Healthy diet Avoid NSAIDs  - Microalbumin/Creatinine Ratio, Urine  5. Benign prostatic hyperplasia with urinary retention Stable   6. Anemia of chronic disease Check lab work await results - Iron, TIBC and Ferritin Panel  7. Iron deficiency anemia, unspecified iron deficiency anemia type Check lab work await results - Iron, TIBC and Ferritin Panel  8. Needs flu shot Flu shot today - Flu Vaccine Trivalent High Dose (Fluad)  9. Poor balance Will print off some exercises he can do if he has ongoing troubles and would like to have physical therapy we can set this up  Follow-up 6 months

## 2023-07-13 NOTE — Addendum Note (Signed)
Addended by: Elizbeth Squires on: 07/13/2023 12:10 PM   Modules accepted: Orders

## 2023-09-05 ENCOUNTER — Telehealth: Payer: Self-pay

## 2023-09-05 NOTE — Telephone Encounter (Signed)
Copied from CRM [id number]. Topic: Clinical - Medical Advice >> Sep 05, 2023  2:20 PM Tiffany H wrote: Reason for CRM: Patient called to ask if his September flu shot contains RSV vaccine, too. Please assist.

## 2023-09-05 NOTE — Telephone Encounter (Signed)
Nurses-please inform patient that RSV vaccine is a separate vaccine that is given through the pharmacy and is recommended every other year thank you

## 2023-09-06 NOTE — Telephone Encounter (Signed)
Called and spoke with patient and advised per Dr Lorin Picket. inform patient that RSV vaccine is a separate vaccine that is given through the pharmacy and is recommended every other year thank you. Patient verbalized understanding.

## 2023-09-19 DIAGNOSIS — D509 Iron deficiency anemia, unspecified: Secondary | ICD-10-CM | POA: Diagnosis not present

## 2023-09-19 DIAGNOSIS — Z79899 Other long term (current) drug therapy: Secondary | ICD-10-CM | POA: Diagnosis not present

## 2023-09-19 DIAGNOSIS — E785 Hyperlipidemia, unspecified: Secondary | ICD-10-CM | POA: Diagnosis not present

## 2023-09-19 DIAGNOSIS — N1832 Chronic kidney disease, stage 3b: Secondary | ICD-10-CM | POA: Diagnosis not present

## 2023-09-21 LAB — CBC WITH DIFFERENTIAL/PLATELET
Basophils Absolute: 0 10*3/uL (ref 0.0–0.2)
Basos: 1 %
EOS (ABSOLUTE): 0.1 10*3/uL (ref 0.0–0.4)
Eos: 3 %
Hematocrit: 39.5 % (ref 37.5–51.0)
Hemoglobin: 12.7 g/dL — ABNORMAL LOW (ref 13.0–17.7)
Immature Grans (Abs): 0 10*3/uL (ref 0.0–0.1)
Immature Granulocytes: 0 %
Lymphocytes Absolute: 1.1 10*3/uL (ref 0.7–3.1)
Lymphs: 33 %
MCH: 30.8 pg (ref 26.6–33.0)
MCHC: 32.2 g/dL (ref 31.5–35.7)
MCV: 96 fL (ref 79–97)
Monocytes Absolute: 0.3 10*3/uL (ref 0.1–0.9)
Monocytes: 9 %
Neutrophils Absolute: 1.7 10*3/uL (ref 1.4–7.0)
Neutrophils: 54 %
Platelets: 163 10*3/uL (ref 150–450)
RBC: 4.13 x10E6/uL — ABNORMAL LOW (ref 4.14–5.80)
RDW: 13.1 % (ref 11.6–15.4)
WBC: 3.2 10*3/uL — ABNORMAL LOW (ref 3.4–10.8)

## 2023-09-21 LAB — IRON,TIBC AND FERRITIN PANEL
Ferritin: 61 ng/mL (ref 30–400)
Iron Saturation: 33 % (ref 15–55)
Iron: 81 ug/dL (ref 38–169)
Total Iron Binding Capacity: 248 ug/dL — ABNORMAL LOW (ref 250–450)
UIBC: 167 ug/dL (ref 111–343)

## 2023-09-21 LAB — BASIC METABOLIC PANEL
BUN/Creatinine Ratio: 9 — ABNORMAL LOW (ref 10–24)
BUN: 20 mg/dL (ref 8–27)
CO2: 23 mmol/L (ref 20–29)
Calcium: 9.4 mg/dL (ref 8.6–10.2)
Chloride: 105 mmol/L (ref 96–106)
Creatinine, Ser: 2.12 mg/dL — ABNORMAL HIGH (ref 0.76–1.27)
Glucose: 81 mg/dL (ref 70–99)
Potassium: 4.5 mmol/L (ref 3.5–5.2)
Sodium: 141 mmol/L (ref 134–144)
eGFR: 33 mL/min/{1.73_m2} — ABNORMAL LOW (ref >59)

## 2023-09-21 LAB — LIPID PANEL
Chol/HDL Ratio: 2.4 {ratio} (ref 0.0–5.0)
Cholesterol, Total: 121 mg/dL (ref 100–199)
HDL: 50 mg/dL (ref >39)
LDL Chol Calc (NIH): 60 mg/dL (ref 0–99)
Triglycerides: 47 mg/dL (ref 0–149)
VLDL Cholesterol Cal: 11 mg/dL (ref 5–40)

## 2023-09-21 LAB — HEPATIC FUNCTION PANEL
ALT: 13 [IU]/L (ref 0–44)
AST: 14 [IU]/L (ref 0–40)
Albumin: 4.4 g/dL (ref 3.9–4.9)
Alkaline Phosphatase: 43 [IU]/L — ABNORMAL LOW (ref 44–121)
Bilirubin Total: 0.5 mg/dL (ref 0.0–1.2)
Bilirubin, Direct: 0.19 mg/dL (ref 0.00–0.40)
Total Protein: 6.7 g/dL (ref 6.0–8.5)

## 2023-09-21 LAB — MICROALBUMIN / CREATININE URINE RATIO
Creatinine, Urine: 211.8 mg/dL
Microalb/Creat Ratio: 11 mg/g{creat} (ref 0–29)
Microalbumin, Urine: 24.2 ug/mL

## 2023-09-23 ENCOUNTER — Encounter: Payer: Self-pay | Admitting: Family Medicine

## 2023-09-23 NOTE — Progress Notes (Signed)
Please mail to patient

## 2023-10-02 ENCOUNTER — Other Ambulatory Visit: Payer: Self-pay

## 2023-10-02 DIAGNOSIS — N1832 Chronic kidney disease, stage 3b: Secondary | ICD-10-CM

## 2023-10-15 ENCOUNTER — Other Ambulatory Visit: Payer: Self-pay | Admitting: Family Medicine

## 2023-10-15 ENCOUNTER — Other Ambulatory Visit: Payer: Self-pay

## 2023-10-15 DIAGNOSIS — E785 Hyperlipidemia, unspecified: Secondary | ICD-10-CM

## 2023-10-15 MED ORDER — ATORVASTATIN CALCIUM 20 MG PO TABS: 20.0000 mg | ORAL_TABLET | Freq: Every day | ORAL | 1 refills | Status: AC

## 2023-10-30 ENCOUNTER — Encounter: Payer: Self-pay | Admitting: Family Medicine

## 2023-11-01 ENCOUNTER — Other Ambulatory Visit: Payer: Self-pay | Admitting: Family Medicine

## 2023-11-01 MED ORDER — DIAZEPAM 5 MG PO TABS: ORAL_TABLET | ORAL | 1 refills | Status: AC

## 2023-12-05 DIAGNOSIS — N5201 Erectile dysfunction due to arterial insufficiency: Secondary | ICD-10-CM | POA: Diagnosis not present

## 2023-12-05 DIAGNOSIS — R8271 Bacteriuria: Secondary | ICD-10-CM | POA: Diagnosis not present

## 2023-12-05 DIAGNOSIS — N401 Enlarged prostate with lower urinary tract symptoms: Secondary | ICD-10-CM | POA: Diagnosis not present

## 2023-12-05 DIAGNOSIS — R351 Nocturia: Secondary | ICD-10-CM | POA: Diagnosis not present

## 2023-12-05 DIAGNOSIS — C61 Malignant neoplasm of prostate: Secondary | ICD-10-CM | POA: Diagnosis not present

## 2024-01-07 DIAGNOSIS — C61 Malignant neoplasm of prostate: Secondary | ICD-10-CM | POA: Diagnosis not present

## 2024-01-09 DIAGNOSIS — C61 Malignant neoplasm of prostate: Secondary | ICD-10-CM | POA: Diagnosis not present

## 2024-02-20 ENCOUNTER — Ambulatory Visit (INDEPENDENT_AMBULATORY_CARE_PROVIDER_SITE_OTHER): Payer: Medicare HMO | Admitting: Family Medicine

## 2024-02-20 ENCOUNTER — Encounter: Payer: Self-pay | Admitting: Family Medicine

## 2024-02-20 VITALS — BP 124/78 | HR 80 | Temp 97.9°F | Ht 70.5 in

## 2024-02-20 DIAGNOSIS — N1832 Chronic kidney disease, stage 3b: Secondary | ICD-10-CM

## 2024-02-20 DIAGNOSIS — R338 Other retention of urine: Secondary | ICD-10-CM

## 2024-02-20 DIAGNOSIS — N184 Chronic kidney disease, stage 4 (severe): Secondary | ICD-10-CM

## 2024-02-20 DIAGNOSIS — C61 Malignant neoplasm of prostate: Secondary | ICD-10-CM | POA: Diagnosis not present

## 2024-02-20 DIAGNOSIS — E785 Hyperlipidemia, unspecified: Secondary | ICD-10-CM | POA: Diagnosis not present

## 2024-02-20 DIAGNOSIS — N401 Enlarged prostate with lower urinary tract symptoms: Secondary | ICD-10-CM

## 2024-02-20 NOTE — Progress Notes (Signed)
   Subjective:    Patient ID: [name], male    DOB: Mar 11, 1951, 73 y.o.   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 Stage 3b chronic kidney disease (HCC) (Primary) Check lab work, avoid NSAIDs, stay active as possible, drink plenty liquids, await lab work results - Basic metabolic panel with GFR  2. Benign prostatic hyperplasia with urinary retention Patient taking medication he does have frequent urination but he has reasonable good flow Prostate cancer was treated with radiation and the PSA has come down  3. Hyperlipidemia, unspecified hyperlipidemia type Keep LDL under good control previous labs look good Follow-up within 6 months

## 2024-03-05 DIAGNOSIS — N1832 Chronic kidney disease, stage 3b: Secondary | ICD-10-CM | POA: Diagnosis not present

## 2024-03-06 LAB — BASIC METABOLIC PANEL WITH GFR
BUN/Creatinine Ratio: 12 (ref 10–24)
BUN: 26 mg/dL (ref 8–27)
CO2: 23 mmol/L (ref 20–29)
Calcium: 10.1 mg/dL (ref 8.6–10.2)
Chloride: 103 mmol/L (ref 96–106)
Creatinine, Ser: 2.18 mg/dL — ABNORMAL HIGH (ref 0.76–1.27)
Glucose: 82 mg/dL (ref 70–99)
Potassium: 4.7 mmol/L (ref 3.5–5.2)
Sodium: 141 mmol/L (ref 134–144)
eGFR: 32 mL/min/{1.73_m2} — ABNORMAL LOW (ref >59)

## 2024-03-10 ENCOUNTER — Ambulatory Visit: Payer: Self-pay | Admitting: Family Medicine

## 2024-03-10 ENCOUNTER — Other Ambulatory Visit: Payer: Self-pay

## 2024-03-10 DIAGNOSIS — E785 Hyperlipidemia, unspecified: Secondary | ICD-10-CM

## 2024-03-10 DIAGNOSIS — N184 Chronic kidney disease, stage 4 (severe): Secondary | ICD-10-CM

## 2024-03-12 DIAGNOSIS — H43393 Other vitreous opacities, bilateral: Secondary | ICD-10-CM | POA: Diagnosis not present

## 2024-06-04 DIAGNOSIS — C61 Malignant neoplasm of prostate: Secondary | ICD-10-CM | POA: Diagnosis not present

## 2024-06-04 DIAGNOSIS — N401 Enlarged prostate with lower urinary tract symptoms: Secondary | ICD-10-CM | POA: Diagnosis not present

## 2024-06-04 DIAGNOSIS — R350 Frequency of micturition: Secondary | ICD-10-CM | POA: Diagnosis not present

## 2024-06-04 DIAGNOSIS — N5201 Erectile dysfunction due to arterial insufficiency: Secondary | ICD-10-CM | POA: Diagnosis not present

## 2024-07-28 ENCOUNTER — Ambulatory Visit: Payer: Self-pay

## 2024-07-28 NOTE — Telephone Encounter (Signed)
 FYI Only or Action Required?: FYI only for provider.  Patient was last seen in primary care on 02/20/2024 by Alphonsa Glendia LABOR, MD.  Called Nurse Triage reporting Back Pain.  Symptoms began several days ago.  Interventions attempted: Nothing.  Symptoms are: unchanged.  Triage Disposition: See PCP When Office is Open (Within 3 Days)  Patient/caregiver understands and will follow disposition?: Yes  Copied from CRM [id number]. Topic: Clinical - Red Word Triage >> Jul 28, 2024 11:12 AM Charlet HERO wrote: Red Word that prompted transfer to Nurse Triage: Patient wife Candis is calling to get asst with making appt with Dr. Glendia lower back is hurting for almost 2 weeks. Luking  RFM Reason for Disposition  [1] MODERATE back pain (e.g., interferes with normal activities) AND [2] present > 3 days  Answer Assessment - Initial Assessment Questions Pt states two weeks ago his lower back just began hurting. Denies injury or trauma. States its his whole lower back. Denies radiation. Denies OTC treatment. Denies numbness/tingling or loss of bowel or bladder.   1. ONSET: When did the pain begin? (e.g., minutes, hours, days)     2 weeks 2. LOCATION: Where does it hurt? (upper, mid or lower back)     LBP 3. SEVERITY: How bad is the pain?  (e.g., Scale 1-10; mild, moderate, or severe)     8/10 4. PATTERN: Is the pain constant? (e.g., yes, no; constant, intermittent)      Intermittent, worse when he first stands up  5. RADIATION: Does the pain shoot into your legs or somewhere else?     denies 6. CAUSE:  What do you think is causing the back pain?      unsure 7. BACK OVERUSE:  Any recent lifting of heavy objects, strenuous work or exercise?     denies 8. MEDICINES: What have you taken so far for the pain? (e.g., nothing, acetaminophen , NSAIDS)     denies 9. NEUROLOGIC SYMPTOMS: Do you have any weakness, numbness, or problems with bowel/bladder control?     denies 10. OTHER SYMPTOMS: Do  you have any other symptoms? (e.g., fever, abdomen pain, burning with urination, blood in urine)       denies  Protocols used: Back Pain-A-AH

## 2024-07-28 NOTE — Telephone Encounter (Signed)
 Appointment scheduled.

## 2024-07-29 ENCOUNTER — Ambulatory Visit (INDEPENDENT_AMBULATORY_CARE_PROVIDER_SITE_OTHER): Payer: Self-pay | Admitting: Family Medicine

## 2024-07-29 ENCOUNTER — Ambulatory Visit (HOSPITAL_COMMUNITY)
Admission: RE | Admit: 2024-07-29 | Discharge: 2024-07-29 | Disposition: A | Discharge status: Home / Self Care | Source: Ambulatory Visit | Attending: Family Medicine | Admitting: Family Medicine

## 2024-07-29 VITALS — BP 134/83 | Ht 70.5 in | Wt 178.1 lb

## 2024-07-29 DIAGNOSIS — N184 Chronic kidney disease, stage 4 (severe): Secondary | ICD-10-CM

## 2024-07-29 DIAGNOSIS — M545 Low back pain, unspecified: Secondary | ICD-10-CM | POA: Insufficient documentation

## 2024-07-29 DIAGNOSIS — E785 Hyperlipidemia, unspecified: Secondary | ICD-10-CM

## 2024-07-29 DIAGNOSIS — Z23 Encounter for immunization: Secondary | ICD-10-CM | POA: Diagnosis not present

## 2024-07-29 DIAGNOSIS — Z79899 Other long term (current) drug therapy: Secondary | ICD-10-CM | POA: Diagnosis not present

## 2024-07-29 DIAGNOSIS — M21379 Foot drop, unspecified foot: Secondary | ICD-10-CM

## 2024-07-29 DIAGNOSIS — C61 Malignant neoplasm of prostate: Secondary | ICD-10-CM

## 2024-07-29 DIAGNOSIS — M47816 Spondylosis without myelopathy or radiculopathy, lumbar region: Secondary | ICD-10-CM | POA: Diagnosis not present

## 2024-07-29 NOTE — Progress Notes (Signed)
   Subjective:    Patient ID: [name], male    DOB: 09-24-1951, 73 y.o.   MRN: [id number]  HPI Patient relates that having a lot of low back pain over the past couple weeks hurts with certain movements certain flexions and certain moving around.  Denies any radiation down the legs.  Does not wake him up at night.  Worse with walking for any significant length worse with standing for any significant length to some degree bothers him when he is sitting and then stands up  Has a history of CKD4 related to dialysis from when he had COVID several years ago and almost died we will check lab work to make sure it is staying in a reasonable range avoid all NSAIDs eat healthy stay hydrated  Dropfoot on the left side as well as left arm weakness makes it difficult for him to do walking on a regular basis he is trying to fit in a little more activity than what he used to do will need a specialized brace to help him  Prostate cancer being managed by his specialists   Review of Systems     Objective:   Physical Exam  General-in no acute distress Eyes-no discharge Lungs-respiratory rate normal, CTA CV-no murmurs,RRR Extremities skin warm dry no edema Neuro grossly normal Behavior normal, alert Weakness of the left arm weakness of the left leg noted      Assessment & Plan:  1. CKD (chronic kidney disease), stage IV (HCC) (Primary) Check lab work await the results Healthy diet and regular activity Avoid NSAIDs  - Parathyroid hormone, intact (no Ca) - Basic metabolic panel with GFR - Microalbumin / creatinine urine ratio  2. Hyperlipidemia, unspecified hyperlipidemia type Continue atorvastatin  and healthy diet - Lipid panel  3. Malignant neoplasm of prostate [facility]) Managed by his specialist PSA has stayed in the low range  4. Drop foot gait Prosthesis written for the patient will send this to his [facility] supply  5. Lumbar pain I doubt that this is related to prostate  cancer but x-rays are indicated Stretching exercises would be best approach Consider physical therapy  - DG Lumbar Spine Complete  6. High risk medication use Labs ordered - Basic metabolic panel with GFR - Hepatic function panel  7. Immunization due Today - Flu vaccine HIGH DOSE PF(Fluzone Trivalent)

## 2024-08-01 LAB — MICROALBUMIN / CREATININE URINE RATIO

## 2024-08-02 LAB — LIPID PANEL
Chol/HDL Ratio: 3.9 ratio (ref 0.0–5.0)
Cholesterol, Total: 218 mg/dL — ABNORMAL HIGH (ref 100–199)
HDL: 56 mg/dL (ref >39)
LDL Chol Calc (NIH): 152 mg/dL — ABNORMAL HIGH (ref 0–99)
Triglycerides: 57 mg/dL (ref 0–149)
VLDL Cholesterol Cal: 10 mg/dL (ref 5–40)

## 2024-08-02 LAB — PARATHYROID HORMONE, INTACT (NO CA): PTH: 47 pg/mL (ref 15–65)

## 2024-08-02 LAB — MICROALBUMIN / CREATININE URINE RATIO

## 2024-08-02 LAB — HEPATIC FUNCTION PANEL
ALT: 8 IU/L (ref 0–44)
AST: 13 IU/L (ref 0–40)
Albumin: 4.8 g/dL (ref 3.8–4.8)
Alkaline Phosphatase: 49 IU/L (ref 47–123)
Bilirubin Total: 0.4 mg/dL (ref 0.0–1.2)
Bilirubin, Direct: 0.13 mg/dL (ref 0.00–0.40)
Total Protein: 7.4 g/dL (ref 6.0–8.5)

## 2024-08-02 LAB — BASIC METABOLIC PANEL WITH GFR
BUN/Creatinine Ratio: 12 (ref 10–24)
BUN: 28 mg/dL — ABNORMAL HIGH (ref 8–27)
CO2: 22 mmol/L (ref 20–29)
Calcium: 10.1 mg/dL (ref 8.6–10.2)
Chloride: 105 mmol/L (ref 96–106)
Creatinine, Ser: 2.29 mg/dL — ABNORMAL HIGH (ref 0.76–1.27)
Glucose: 78 mg/dL (ref 70–99)
Potassium: 4.8 mmol/L (ref 3.5–5.2)
Sodium: 142 mmol/L (ref 134–144)
eGFR: 30 mL/min/1.73 — ABNORMAL LOW (ref >59)

## 2024-08-04 ENCOUNTER — Ambulatory Visit: Payer: Self-pay | Admitting: Family Medicine

## 2024-08-04 DIAGNOSIS — E785 Hyperlipidemia, unspecified: Secondary | ICD-10-CM

## 2024-08-04 DIAGNOSIS — N184 Chronic kidney disease, stage 4 (severe): Secondary | ICD-10-CM

## 2024-08-04 DIAGNOSIS — Z79899 Other long term (current) drug therapy: Secondary | ICD-10-CM

## 2024-08-04 LAB — FECAL OCCULT BLOOD, IMMUNOCHEMICAL: Fecal Occult Blood: NEGATIVE

## 2024-08-06 ENCOUNTER — Encounter: Payer: Self-pay | Admitting: Family Medicine

## 2024-08-06 NOTE — Telephone Encounter (Signed)
 Nurses Please order metabolic 7, lipid, liver, phosphorus, urine ACR  CKD 3B, hyperlipidemia, high risk med  Please send MyChart message directly reminding him to do lab work a week before his follow-up office visit in April  Thanks-Dr. Glendia

## 2024-08-08 ENCOUNTER — Encounter: Payer: Self-pay | Admitting: Family Medicine

## 2024-08-12 NOTE — Telephone Encounter (Signed)
 See other message- script has been faxed to [facility]

## 2024-08-13 ENCOUNTER — Telehealth: Payer: Self-pay

## 2024-08-13 NOTE — Telephone Encounter (Signed)
 Copied from CRM [id number]. Topic: Clinical - Medication Question >> Aug 13, 2024  2:42 PM Rosaria E wrote: Reason for CRM: called requesting to speak to someone in the clinic regarding a foot brace.   Best contact: [phone]

## 2024-08-13 NOTE — Telephone Encounter (Signed)
 Patient needs RX for AFO Short Drop Foot Brace for the left foot sent to [facility]  Fax number is [phone]

## 2024-08-15 NOTE — Telephone Encounter (Signed)
 Patient stated everything had been taken care of and he was good

## 2024-08-21 ENCOUNTER — Ambulatory Visit: Admitting: Family Medicine

## 2024-08-22 DIAGNOSIS — M21372 Foot drop, left foot: Secondary | ICD-10-CM | POA: Diagnosis not present

## 2024-08-24 ENCOUNTER — Ambulatory Visit: Payer: Self-pay | Admitting: Family Medicine

## 2024-08-27 ENCOUNTER — Telehealth: Payer: Self-pay | Admitting: Orthopedic Surgery

## 2024-08-27 NOTE — Telephone Encounter (Signed)
 This patient is requesting to see you for drop foot.  He stated no ed, no xrays, no ortho, but did state you did surgery on one of his feet years ago, but he doesn't remember which one.  He wants to know if coming here is where he needs to go, he stated he trust you.  Please advise.  [phone]

## 2024-08-28 NOTE — Telephone Encounter (Signed)
 I called the pt and advised, he verbalized understanding.

## 2024-09-05 ENCOUNTER — Ambulatory Visit

## 2024-09-10 ENCOUNTER — Encounter: Payer: Self-pay | Admitting: Family Medicine

## 2024-09-10 ENCOUNTER — Ambulatory Visit: Admitting: Family Medicine

## 2024-09-10 VITALS — BP 124/86 | HR 96 | Ht 70.5 in | Wt 183.4 lb

## 2024-09-10 DIAGNOSIS — R300 Dysuria: Secondary | ICD-10-CM | POA: Diagnosis not present

## 2024-09-10 DIAGNOSIS — M545 Low back pain, unspecified: Secondary | ICD-10-CM | POA: Diagnosis not present

## 2024-09-10 NOTE — Progress Notes (Signed)
   Subjective:    Patient ID: [name], male    DOB: 12-08-1950, 73 y.o.   MRN: [id number] Here for f/u and back pain. 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 Because of this he has abnormal gait Causes him a lot of pain and discomfort    Review of Systems     Objective:   Physical Exam Lungs clear heart regular abnormal gait noted subjective discomfort left lower back x-rays were reviewed with the patient       Assessment & Plan:  Back pain no sciatica Patient has CKD I would not recommend anti-inflammatory May use Tylenol  as needed Recommend stretching exercises on a regular basis If not better in the next 8 to 10 weeks consider scan Patient was concerned about UTI urinalysis negative X-ray shows degenerative changes Patient defers physical therapy would like to try home stretches first for the next 6 to 8 weeks

## 2024-09-13 ENCOUNTER — Ambulatory Visit: Payer: Self-pay | Admitting: Family Medicine

## 2024-09-13 LAB — URINALYSIS
Bilirubin, UA: NEGATIVE
Glucose, UA: NEGATIVE
Ketones, UA: NEGATIVE
Leukocytes,UA: NEGATIVE
Nitrite, UA: NEGATIVE
Specific Gravity, UA: 1.025 (ref 1.005–1.030)
Urobilinogen, Ur: 0.2 mg/dL (ref 0.2–1.0)
pH, UA: 6.5 (ref 5.0–7.5)

## 2024-09-15 ENCOUNTER — Other Ambulatory Visit: Payer: Self-pay | Admitting: Family Medicine

## 2024-09-29 ENCOUNTER — Ambulatory Visit: Admitting: Orthopedic Surgery

## 2024-10-07 ENCOUNTER — Ambulatory Visit: Admitting: Family Medicine

## 2024-10-09 ENCOUNTER — Ambulatory Visit: Admitting: Orthopedic Surgery

## 2024-10-09 VITALS — BP 124/86 | Ht 70.5 in | Wt 183.0 lb

## 2024-10-09 DIAGNOSIS — M21372 Foot drop, left foot: Secondary | ICD-10-CM | POA: Diagnosis not present

## 2024-10-09 NOTE — Patient Instructions (Signed)
 Physical therapy has been ordered for you at [facility]. They should call you to schedule, [phone] is the phone number to call, if you want to call to schedule.

## 2024-10-09 NOTE — Progress Notes (Signed)
°  Intake history:  Chief Complaint  Patient presents with   Foot Problem    Foot drop left     BP 124/86 Comment: 09/10/24  Ht 5' 10.5 (1.791 m)   Wt 183 lb (83 kg)   BMI 25.89 kg/m  Body mass index is 25.89 kg/m.  Pharmacy? ____[facility]__________________________________  WHAT ARE WE SEEING YOU FOR TODAY?   Left foot drop   How long has this bothered you? (DOI?DOS?WS?)  2001  Was there an injury? No  Anticoag.  No   Any ALLERGIES _______Allergies[1] _______________________________________   Treatment:  Have you taken:  Tylenol  No  Advil No  Had PT No  Had injection No  Other  ___________has brace hard to put shoe on with brace doesn't wear it.______________           [1]  Allergies Allergen Reactions   Hydrocodone  Other (See Comments)    Disorientation/ confusion

## 2024-10-09 NOTE — Progress Notes (Signed)
°  Subjective:     Patient ID: [name], male   DOB: 1951/07/28, 73 y.o.   MRN: [id number]  Patient presents with: Foot Problem: Foot drop left  73 year old male had a stroke during COVID after the stroke he developed a foot drop he has a large brace which controls his foot drop but it is too bulky for him and he would rather have something smaller  He did receive a smaller thinner brace/AFO but it does not work as well  He is here to discuss further treatment options     Review of Systems     Objective:   Physical Exam Constitutional:      Appearance: Normal appearance. He is normal weight.  HENT:     Head: Normocephalic and atraumatic.  Cardiovascular:     Rate and Rhythm: Normal rate.  Skin:    General: Skin is warm and dry.     Capillary Refill: Capillary refill takes less than 2 seconds.  Neurological:     Mental Status: He is alert.     Gait: Gait abnormal.  Psychiatric:        Mood and Affect: Mood normal.        Behavior: Behavior normal.        Thought Content: Thought content normal.        Judgment: Judgment normal.    Left foot  He has good dorsiflexion strength mild weakness plantarflexion mild weakness posterior tibial tendon and more moderate weakness of the peroneals     Assessment:     Post stroke weak left foot and ankle    Plan:     The patient prefers not to use the more significant brace which does control the foot and ankle.  He has a smaller brace which she received recently and it does not work as well  We discussed this  It is probably best to just use the bigger brace but since he does not want to use that 1 it would be wise to try to strengthen the weak muscles in his foot and ankle and see if the smaller brace can control the foot  I do not think surgery is needed because the bigger brace does control the symptoms

## 2024-10-10 LAB — MICROALBUMIN / CREATININE URINE RATIO
Creatinine, Urine: 248.4 mg/dL
Microalb/Creat Ratio: 22 mg/g{creat} (ref 0–29)
Microalbumin, Urine: 53.6 ug/mL

## 2024-10-10 LAB — LIPID PANEL
Chol/HDL Ratio: 2.7 ratio (ref 0.0–5.0)
Cholesterol, Total: 130 mg/dL (ref 100–199)
HDL: 49 mg/dL
LDL Chol Calc (NIH): 68 mg/dL (ref 0–99)
Triglycerides: 64 mg/dL (ref 0–149)
VLDL Cholesterol Cal: 13 mg/dL (ref 5–40)

## 2024-10-10 LAB — HEPATIC FUNCTION PANEL
ALT: 18 IU/L (ref 0–44)
AST: 16 IU/L (ref 0–40)
Albumin: 4.9 g/dL — ABNORMAL HIGH (ref 3.8–4.8)
Alkaline Phosphatase: 49 IU/L (ref 47–123)
Bilirubin Total: 0.5 mg/dL (ref 0.0–1.2)
Bilirubin, Direct: 0.17 mg/dL (ref 0.00–0.40)
Total Protein: 7.6 g/dL (ref 6.0–8.5)

## 2024-10-10 LAB — BASIC METABOLIC PANEL WITH GFR
BUN/Creatinine Ratio: 10 (ref 10–24)
BUN: 22 mg/dL (ref 8–27)
CO2: 20 mmol/L (ref 20–29)
Calcium: 10.1 mg/dL (ref 8.6–10.2)
Chloride: 102 mmol/L (ref 96–106)
Creatinine, Ser: 2.13 mg/dL — ABNORMAL HIGH (ref 0.76–1.27)
Glucose: 82 mg/dL (ref 70–99)
Potassium: 5.5 mmol/L — ABNORMAL HIGH (ref 3.5–5.2)
Sodium: 141 mmol/L (ref 134–144)
eGFR: 32 mL/min/1.73 — ABNORMAL LOW

## 2024-10-10 LAB — PHOSPHORUS: Phosphorus: 2.8 mg/dL (ref 2.8–4.1)

## 2024-10-21 ENCOUNTER — Telehealth: Payer: Self-pay

## 2024-10-21 NOTE — Telephone Encounter (Signed)
 Copied from CRM #[id number]. Topic: Medical Record Request - Payor/Billing Request >> Oct 21, 2024  4:17 PM Tinnie C wrote: Reason for CRM: Akil with [employer] is calling to request the information for this patient's last hba1c labs be faxed to [phone]. The fax must include what was done and the results along with the patients' name, date of birth, the providers name, and the name of the practice.

## 2024-10-30 ENCOUNTER — Ambulatory Visit: Admitting: Family Medicine

## 2024-10-30 VITALS — BP 141/91 | HR 89 | Temp 98.2°F | Ht 70.5 in | Wt 178.1 lb

## 2024-10-30 DIAGNOSIS — M545 Low back pain, unspecified: Secondary | ICD-10-CM | POA: Diagnosis not present

## 2024-10-30 DIAGNOSIS — N184 Chronic kidney disease, stage 4 (severe): Secondary | ICD-10-CM | POA: Diagnosis not present

## 2024-10-30 DIAGNOSIS — M21379 Foot drop, unspecified foot: Secondary | ICD-10-CM

## 2024-10-30 NOTE — Progress Notes (Signed)
" ° °  Subjective:    Patient ID: [name], male    DOB: [birth year], 74 y.o.   MRN: [id number]  HPI Patient is in room 10:   Patient is here for a 6 month follow up. He relates back pain discomfort stiff hurts sometimes radiates into the left hip region does not go down the leg He has had some ongoing problems with feeling relatively stiff He also has dropfoot on the left side Denies any numbness tingling Has chronic kidney disease He was thinking about stem cell treatment for his dropfoot but he understands how this is experimental and very costly  Patient stated his back is getting better but still at times has issues with his foot drop   Review of Systems     Objective:   Physical Exam Negative straight leg raise Lungs clear heart regular Dropfoot noted Walks with a cane       Assessment & Plan:  1. CKD (chronic kidney disease), stage IV (HCC) (Primary) Stable Monitor closely Avoid all NSAIDs  2. Drop foot gait Referral to [facility] for orthotics/brace  3. Lumbar pain Gentle range of motion if not doing dramatically better over the course of the next few weeks notify us  and we will do MRI  "

## 2024-11-05 ENCOUNTER — Other Ambulatory Visit: Payer: Self-pay

## 2024-11-05 DIAGNOSIS — M21379 Foot drop, unspecified foot: Secondary | ICD-10-CM

## 2024-11-06 ENCOUNTER — Encounter: Payer: Self-pay | Admitting: Family Medicine

## 2024-11-10 NOTE — Therapy (Unsigned)
 " OUTPATIENT PHYSICAL THERAPY LOWER EXTREMITY EVALUATION   Patient Name: [name] MRN: [id number] DOB:[birth year], 74 y.o., male Today's Date: 11/13/2024  END OF SESSION:  PT End of Session - 11/13/24 1500     Visit Number 1    Number of Visits 16    Date for Recertification  12/12/24    Authorization Type [employer]    Authorization Time Period Auth requested    Progress Note Due on Visit 10    PT Start Time 1501    PT Stop Time 1548    PT Time Calculation (min) 47 min    Activity Tolerance Patient tolerated treatment well    Behavior During Therapy WFL for tasks assessed/performed          Past Medical History:  Diagnosis Date   Anemia associated with chronic renal failure    BPH (benign prostatic hyperplasia)    CKD (chronic kidney disease), stage IV (HCC) 07/2019   followed by pcp/   nephrologist---  Dr.Kruska --  (11-10-2022  requested and received via fax lov 03-25-2021, to be scanned in epic after surgery 11-16-2022,  per note as needed basis)  10/ 2020 severe Covid w/ ARF , was on dialysis while in the hospital currently not on dialysis   Complete paralysis of right vocal cord    ENT in care everywhere note 07-13-2021   ED (erectile dysfunction)    Foot drop, left    residual CVA 11/ 2020  uses AFO brace,  occasionally uses cane   Hemiparesis affecting left side as late effect of stroke (HCC) 08/25/2019   per last neurology office note 09-23-2020  continued improvement LUE weakness, left ankle dorsiflexion weakness (foot drop), mild imbalance   Hiatal hernia    History of anoxic brain injury 07/25/2019   in setting ARDS /  severe covid ,  EMS to ED ([facility])  O2 sat 48%  (bilateral frontal)  due to hypoxia/ hypotension   History of cerebrovascular accident (CVA) involving cerebellum 08/26/2019   in setting ARDS/ sev covid;  etiology due to hypotension secondary to dialysis & sepsis//   MRI acute-early subacute infacrts involving bilateral cerebral cortex and  right cerebellar w/ residual left hemiparesis, imbalance w/ gait impairment  (neurologist--- dr rosemarie ronco in epic 09-22-2020 no follow since on ASA 81mg  for prevention)   History of gastroesophageal reflux (GERD)    11-14-2022  pt stated no issue since retired   History of GI bleed 08/10/2019   in setting ARDS w/ sev covid was on ASA 325 mg;   EDG w/ hemostatis and clipping of bleeding gastric ulcer   History of Helicobacter pylori infection 08/10/2019   EGD bx, treated (in setting upper gi bleed due to asa)   History of hemodialysis 07/2019   in setting ARDS /  sev covid   History of severe acute respiratory syndrome coronavirus 2 (SARS-CoV-2) disease 07/25/2019   dx positive covid 07-23-2019;  ED by EMS 07-25-2019 O2 sat 48%, intubated, AKI ;  transfered to [facility] (covid hospital) long admission VDRF/ MSSA bacterium/ episodes AFib w/ RVR/  CVA right cellebar /  CRRT to HD/  pressure ulcers/ decanulated 08-22-2019;  admitted to rehab 10-03-2019 debility, d/c'd 11-07-2019   History of tracheostomy 07/2019   History of traumatic subdural hematoma 11/27/2019   concussion without loc , traumatic SDH post fall 11-27-2019,  per neurologist note 09-22-2020 in epic resolved   Imbalance due to old stroke 08/2019   residual imbalance post cva ,  uses cane occasionally   Malignant neoplasm prostate [facility]) 05/2022   urologsit--- dr borden/  radiation oncologist--- dr patrcia--  dx 08/ 2023,  Gleason 3+4   PAF (paroxysmal atrial fibrillation) (HCC) 07/2019   episodes w/ AFib w/ RVR in setting ARDS w/ severe covid  (followed by pcp)   Primary spontaneous pneumothorax    Past Surgical History:  Procedure Laterality Date   ACHILLES TENDON SURGERY Right 02/24/2011   @[facility]  by dr margrette RAO VEIN TRANSPOSITION  10/13/2019   Procedure: Basilic Vein Transposition Left Arm;  Surgeon: Eliza Lonni RAMAN, MD;  Location: [facility] OR;  Service: Vascular;;   COLONOSCOPY  2009   [facility]   ESOPHAGOGASTRODUODENOSCOPY  N/A 12/25/2014   Procedure: ESOPHAGOGASTRODUODENOSCOPY (EGD);  Surgeon: Margo LITTIE Haddock, MD;  Location: [facility];  Service: Endoscopy;  Laterality: N/A;  830am   ESOPHAGOGASTRODUODENOSCOPY N/A 08/09/2019   Procedure: ESOPHAGOGASTRODUODENOSCOPY (EGD);  Surgeon: Donnald Charleston, MD;  Location: [facility];  Service: Endoscopy;  Laterality: N/A;  Patient is already sedated, so I do not anticipate need for further sedation.  Okay from my standpoint to do either at the bedside or in the operating room   GOLD SEED IMPLANT N/A 11/16/2022   Procedure: GOLD SEED IMPLANT;  Surgeon: Renda Glance, MD;  Location: [facility];  Service: Urology;  Laterality: N/A;  30 MINUTES NEEDED FOR CASE   HEMOSTASIS CLIP PLACEMENT  08/09/2019   Procedure: HEMOSTASIS CLIP PLACEMENT;  Surgeon: Donnald Charleston, MD;  Location: [facility];  Service: Endoscopy;;   HEMOSTASIS CONTROL  08/09/2019   Procedure: HEMOSTASIS CONTROL;  Surgeon: Donnald Charleston, MD;  Location: [facility];  Service: Endoscopy;;  epi    IR FLUORO GUIDE CV LINE LEFT  09/02/2019   IR REMOVAL TUN CV CATH W/O FL  10/29/2019   IR US  GUIDE VASC ACCESS LEFT  09/02/2019   KNEE SURGERY Left 1980   per pt repair ligament   LIGATION OF COMPETING BRANCHES OF ARTERIOVENOUS FISTULA Left 04/21/2021   Procedure: LIGATION OF LEFT ARM ARTERIOVENOUS BRACHIOBASILIC FISTULA;  Surgeon: Oris Krystal FALCON, MD;  Location: [facility];  Service: Vascular;  Laterality: Left;   SPACE OAR INSTILLATION N/A 11/16/2022   Procedure: SPACE OAR INSTILLATION;  Surgeon: Renda Glance, MD;  Location: [facility];  Service: Urology;  Laterality: N/A;   TONSILLECTOMY     age 45   WRIST SURGERY Left 1970   per pt repair torn ligament   Patient Active Problem List   Diagnosis Date Noted   Cough 04/13/2023   Malignant neoplasm of prostate (HCC) 09/22/2022   Neck pain 08/28/2022   History of tracheostomy 02/02/2020   CKD (chronic kidney disease), stage IV  (HCC) 12/21/2019   Sleep disturbance    Anemia of chronic disease    Cerebral thrombosis with cerebral infarction 08/28/2019   Constipation 08/09/2019   BPH (benign prostatic hyperplasia) 10/31/2013   S/P Achilles tendon repair 04/05/2011    PCP: Alphonsa Glendia LABOR, MD  REFERRING PROVIDER: Margrette Taft BRAVO, MD  REFERRING DIAG: [phone] (ICD-10-CM) - Left foot drop  THERAPY DIAG:  Impaired functional mobility, balance, gait, and endurance  Decreased strength of lower extremity  Decreased range of motion of left ankle  Rationale for Evaluation and Treatment: Rehabilitation  ONSET DATE: 4.5 years  SUBJECTIVE:   SUBJECTIVE STATEMENT: Patient reports he caught COVID 4.5 years ago. Had a stroke while in the hospital. Reports he hasn't been able to do much since then. Tried to go to the  gym not long ago but hyperventilated due to being overwhelmed. Has since had drop foot in L foot. Has some motion in DF/PF but side to side is the hardest.  Was very active before he got COVID.   PERTINENT HISTORY: Plan The patient prefers not to use the more significant brace which does control the foot and ankle.  He has a smaller brace which she received recently and it does not work as well   We discussed this   It is probably best to just use the bigger brace but since he does not want to use that 1 it would be wise to try to strengthen the weak muscles in his foot and ankle and see if the smaller brace can control the foot   I do not think surgery is needed because the bigger brace does control the symptoms  PAIN:  Are you having pain? No but has back pain sometimes  PRECAUTIONS: Fall  RED FLAGS: Reports N/T on L side (UE and LE), constant   WEIGHT BEARING RESTRICTIONS: No  FALLS:  Has patient fallen in last 6 months? No  LIVING ENVIRONMENT: Lives with: lives with their spouse Lives in: House/apartment Stairs: No Has following equipment at home: Single point cane and  pre-fabricated AFO for left foot  OCCUPATION: Retired   PLOF: Independent  PATIENT GOALS: To have balance corrected and have more control on drop foot  NEXT MD VISIT: Will have a visit with [employer], hasn't set up visit yet.           No follow up with MD Margrette  OBJECTIVE:  Note: Objective measures were completed at Evaluation unless otherwise noted.  DIAGNOSTIC FINDINGS:  FINDINGS: Five lumbar type vertebral bodies are well visualized. Vertebral body height is well maintained. No pars defects are noted. Disc space narrowing at L4-5 is seen. No soft tissue abnormality is noted.   IMPRESSION: Mild degenerative change without acute abnormality.    PATIENT SURVEYS:  LEFS: Lower Extremity Functional Score: 55 / 80 = 68.8 % ABC Scale: Total ABC score: 1100 / 1600 = 68.8 %   COGNITION: Overall cognitive status: Within functional limits for tasks assessed     SENSATION: Light touch: Impaired  Decreased sensation throughout LLE dermatomes  POSTURE: rounded shoulders and forward head  PALPATION: Not assessed   LOWER EXTREMITY ROM:  Active ROM Right eval Left eval  Hip flexion    Hip extension    Hip abduction    Hip adduction    Hip internal rotation    Hip external rotation    Knee flexion    Knee extension    Ankle dorsiflexion 8 3  Ankle plantarflexion 65 20  Ankle inversion 20 10  Ankle eversion 30 15   (Blank rows = not tested)  LOWER EXTREMITY MMT:  MMT Right eval Left eval  Hip flexion    Hip extension    Hip abduction 5/5 3+  Hip adduction    Hip internal rotation    Hip external rotation    Knee flexion    Knee extension    Ankle dorsiflexion    Ankle plantarflexion    Ankle inversion    Ankle eversion     (Blank rows = not tested)   FUNCTIONAL TESTS:  5 times sit to stand: 12 seconds, BUE use Timed up and go (TUG): 11 seconds with SPC  GAIT: Distance walked: 150 ft in session  Assistive device utilized: Single point cane Level  of assistance: Modified  independence Comments: Dec DF with L foot, causing foot drag/toe catching at times, circumduction/hip hiking at times with LLE, SPC in R side, encouraged increased hip/knee flexion to clear foot with improvement                                                                                                                                TREATMENT DATE:  11/13/24: PT eval and HEP    PATIENT EDUCATION:  Education details: PT evaluation, objective findings, POC, Importance of HEP, Precautions, Clinic policies Person educated: Patient Education method: Explanation and Demonstration Education comprehension: verbalized understanding and returned demonstration  HOME EXERCISE PROGRAM: Access Code: [email or web address] URL: [email or web address] Date: 11/13/2024 Prepared by: Rosaria Powell-Butler  Exercises - Seated Toe Raise  - 2-3 x daily - 7 x weekly - 3 sets - 10 reps - 5 hold - Ankle Inversion Eversion Towel Slide  - 2-3 x daily - 7 x weekly - 3 sets - 10 reps - Heel Raises with Counter Support  - 2-3 x daily - 7 x weekly - 3 sets - 10 reps  ASSESSMENT:  CLINICAL IMPRESSION: Patient is a 74 y.o. male who was seen today for physical therapy evaluation and treatment for M21.372 (ICD-10-CM) - Left foot drop. On this date, patient demonstrates impaired self perception of function via LEFS and ABC Scale, decreased L ankle ROM, decreased LLE strength, decreased endurance, and impaired balance, all of which may be contributing to patient's altered gait pattern, decreased activity tolerance, difficulty with transfers and impairing their overall function. Patient reports owning multiple prefabricated braces/AFOs, at least 4. Instructed patient to bring brace that best supports him to next session for assessment. Patient also reports already being in contact with [employer] for custom AFO. Highly encouraged reaching back out to them for best possible support while ambulating as he  tends to drag foot at times during gait. Patient will benefit from skilled physical therapy to address the above/below deficits in order to improve pain and overall function.    OBJECTIVE IMPAIRMENTS: Abnormal gait, decreased activity tolerance, decreased balance, decreased endurance, decreased mobility, difficulty walking, decreased ROM, decreased strength, impaired perceived functional ability, improper body mechanics, and postural dysfunction.   ACTIVITY LIMITATIONS: squatting, stairs, transfers, and locomotion level  PARTICIPATION LIMITATIONS: cleaning, laundry, and community activity  PERSONAL FACTORS: Time since onset of injury/illness/exacerbation are also affecting patient's functional outcome.   REHAB POTENTIAL: Fair See above  CLINICAL DECISION MAKING: Stable/uncomplicated  EVALUATION COMPLEXITY: Low   GOALS: Goals reviewed with patient? No  SHORT TERM GOALS: Target date: 12/11/24 Patient will be independent with performance of HEP to demonstrate adequate self management of symptoms.  Baseline:  Goal status: INITIAL  2.   Patient will report at least a 50% improvement with function and/or pain reduction overall since beginning PT. Baseline:  Goal status: INITIAL   LONG TERM GOALS: Target date: 01/08/25 Patient will improve LEFS score by 9 points to demonstrate improved perceived  function while meeting MCID.  Baseline: Goal status: INITIAL Patient will improve ABC Scale score by 10 percent to demonstrate improved perceived function while meeting MCID.  Baseline: Goal status: INITIAL 3.  Patient will improve all  L ankle ROM by at least 5 degrees to demonstrate improved LE mobility needed for functional gait mechanics.  Baseline:  Goal status: INITIAL 4.  Patient will score at least a  4-/5 on  L ankle DF MMT to demonstrate increased LE strength and/or power needed to improve ambulation/gait mechanics and limit toe drag during gait.  Baseline:  Goal status: INITIAL    5.  Patient will increase 2 minute walk test gait distance by at least 40 ft with least restrictive assistive device to demonstrate improved endurance and functional mobility needed for ambulating household and community distances.  Baseline: Goal status: INITIAL    PLAN:  PT FREQUENCY: 1-2x/week  PT DURATION: 8 weeks  PLANNED INTERVENTIONS: 97164- PT Re-evaluation, 97110-Therapeutic exercises, 97530- Therapeutic activity, [id number]- Neuromuscular re-education, 97535- Self Care, 02859- Manual therapy, [id number]- Gait training, [phone]- Electrical stimulation (manual), [id number]- Ultrasound, 02987- Traction (mechanical), [phone] (1-2 muscles), 20561 (3+ muscles)- Dry Needling, Patient/Family education, Balance training, Stair training, Taping, Joint mobilization, Spinal mobilization, Cryotherapy, and Moist heat  PLAN FOR NEXT SESSION: Review HEP and goals, 2 minute walk test, DGI, continue to encourage use of AFO/brace for better L foot control during gait, possible e-stim to L ant tib when appropriate   3:58 PM, 11/13/24 Rosaria Settler, PT, DPT [facility]   [employer] Auth Request Treatment Start Date: 11/13/24  Referring diagnosis code (ICD 10)? M21.372 (ICD-10-CM) - Left foot drop Treatment diagnosis codes (ICD 10)? (if different than referring diagnosis) Z74.09, R29.898, M25.672 What was this (referring dx) caused by? []  Surgery []  Fall []  Ongoing issue []  Arthritis [x]  Other: _CVA_  Laterality: []  Rt [x]  Lt []  Both  Deficits: []  Pain [x]  Stiffness [x]  Weakness []  Edema [x]  Balance Deficits [x]  Coordination [x]  Gait Disturbance [x]  ROM []  Other   Functional Tool Score:  LEFS: Lower Extremity Functional Score: 55 / 80 = 68.8 % ABC Scale: Total ABC score: 1100 / 1600 = 68.8 %  CPT codes: See Planned Interventions listed in the Plan section of the Evaluation. "

## 2024-11-13 ENCOUNTER — Encounter (HOSPITAL_COMMUNITY): Payer: Self-pay

## 2024-11-13 ENCOUNTER — Other Ambulatory Visit: Payer: Self-pay

## 2024-11-13 ENCOUNTER — Ambulatory Visit (HOSPITAL_COMMUNITY): Attending: Orthopedic Surgery

## 2024-11-13 DIAGNOSIS — R29898 Other symptoms and signs involving the musculoskeletal system: Secondary | ICD-10-CM | POA: Insufficient documentation

## 2024-11-13 DIAGNOSIS — Z7409 Other reduced mobility: Secondary | ICD-10-CM | POA: Insufficient documentation

## 2024-11-13 DIAGNOSIS — M25672 Stiffness of left ankle, not elsewhere classified: Secondary | ICD-10-CM | POA: Diagnosis present

## 2024-11-13 DIAGNOSIS — M21372 Foot drop, left foot: Secondary | ICD-10-CM | POA: Insufficient documentation

## 2024-11-14 NOTE — Telephone Encounter (Signed)
 Nurses  Please forward this message to the business department/[name] so she could look into see why he has a bill and might be able to give him some feedback on what he needs to do or who he needs to talk with  Secondly-you may share the following message with [name] as well Hi [name] I looked back on your notes.  Back in 2021 you saw the physical medicine specialist who at that time felt that your foot drop was coming from the stroke that you have had.  In those situations they typically do not do nerve conduction studies.  If you are interested in looking into this further with evaluation regarding nerve conduction studies and whether or not they might be beneficial I can get you set back up with a good physical medicine doctor specialist for further evaluation and discussion.  I really do not think a plain neurologist would be of much help.  My gut feeling says that your dropfoot is related to the damage that you went through during COVID.  I am not opposed to you seeing a specialist and I am certainly not opposed to them running any test but I am not necessarily optimistic that this issue will be  solved/cured to where it goes away.  But if you are interested in doing a consultation with this particular type of specialist let me know and we can help set this up in [place]  Please take care-[name]

## 2024-11-18 ENCOUNTER — Ambulatory Visit (HOSPITAL_COMMUNITY)

## 2024-11-20 ENCOUNTER — Ambulatory Visit (HOSPITAL_COMMUNITY)

## 2024-11-28 ENCOUNTER — Ambulatory Visit (HOSPITAL_COMMUNITY)

## 2024-11-28 ENCOUNTER — Encounter (HOSPITAL_COMMUNITY): Payer: Self-pay

## 2024-11-28 DIAGNOSIS — M25672 Stiffness of left ankle, not elsewhere classified: Secondary | ICD-10-CM

## 2024-11-28 DIAGNOSIS — R29898 Other symptoms and signs involving the musculoskeletal system: Secondary | ICD-10-CM

## 2024-11-28 DIAGNOSIS — Z7409 Other reduced mobility: Secondary | ICD-10-CM

## 2024-11-28 NOTE — Therapy (Signed)
 " OUTPATIENT PHYSICAL THERAPY LOWER EXTREMITY TREATMENT   Patient Name: [name] MRN: [id number] DOB:1951/07/18, 74 y.o., male Today's Date: 11/28/2024  END OF SESSION:  PT End of Session - 11/28/24 0900     Visit Number 2    Number of Visits 16    Date for Recertification  12/12/24    Authorization Type [employer]    Authorization Time Period Cohere approved 8 visits from 11/13/24-12/13/2024    Authorization - Visit Number 2    Authorization - Number of Visits 8    Progress Note Due on Visit 10    PT Start Time 0901    PT Stop Time 0948    PT Time Calculation (min) 47 min    Activity Tolerance Patient tolerated treatment well    Behavior During Therapy [facility] for tasks assessed/performed          Past Medical History:  Diagnosis Date   Anemia associated with chronic renal failure    BPH (benign prostatic hyperplasia)    CKD (chronic kidney disease), stage IV (HCC) 07/2019   followed by pcp/   nephrologist---  Dr.Kruska --  (11-10-2022  requested and received via fax lov 03-25-2021, to be scanned in epic after surgery 11-16-2022,  per note as needed basis)  10/ 2020 severe Covid w/ ARF , was on dialysis while in the hospital currently not on dialysis   Complete paralysis of right vocal cord    ENT in care everywhere note 07-13-2021   ED (erectile dysfunction)    Foot drop, left    residual CVA 11/ 2020  uses AFO brace,  occasionally uses cane   Hemiparesis affecting left side as late effect of stroke (HCC) 08/25/2019   per last neurology office note 09-23-2020  continued improvement LUE weakness, left ankle dorsiflexion weakness (foot drop), mild imbalance   Hiatal hernia    History of anoxic brain injury 07/25/2019   in setting ARDS /  severe covid ,  EMS to ED ([facility])  O2 sat 48%  (bilateral frontal)  due to hypoxia/ hypotension   History of cerebrovascular accident (CVA) involving cerebellum 08/26/2019   in setting ARDS/ sev covid;  etiology due to hypotension  secondary to dialysis & sepsis//   MRI acute-early subacute infacrts involving bilateral cerebral cortex and right cerebellar w/ residual left hemiparesis, imbalance w/ gait impairment  (neurologist--- dr rosemarie ronco in epic 09-22-2020 no follow since on ASA 81mg  for prevention)   History of gastroesophageal reflux (GERD)    11-14-2022  pt stated no issue since retired   History of GI bleed 08/10/2019   in setting ARDS w/ sev covid was on ASA 325 mg;   EDG w/ hemostatis and clipping of bleeding gastric ulcer   History of Helicobacter pylori infection 08/10/2019   EGD bx, treated (in setting upper gi bleed due to asa)   History of hemodialysis 07/2019   in setting ARDS /  sev covid   History of severe acute respiratory syndrome coronavirus 2 (SARS-CoV-2) disease 07/25/2019   dx positive covid 07-23-2019;  ED by EMS 07-25-2019 O2 sat 48%, intubated, AKI ;  transfered to [facility] (covid hospital) long admission VDRF/ MSSA bacterium/ episodes AFib w/ RVR/  CVA right cellebar /  CRRT to HD/  pressure ulcers/ decanulated 08-22-2019;  admitted to rehab 10-03-2019 debility, d/c'd 11-07-2019   History of tracheostomy 07/2019   History of traumatic subdural hematoma 11/27/2019   concussion without loc , traumatic SDH post fall 11-27-2019,  per neurologist note 09-22-2020 in epic resolved   Imbalance due to old stroke 08/2019   residual imbalance post cva , uses cane occasionally   Malignant neoplasm prostate (HCC) 05/2022   urologsit--- dr borden/  radiation oncologist--- dr patrcia--  dx 08/ 2023,  Gleason 3+4   PAF (paroxysmal atrial fibrillation) (HCC) 07/2019   episodes w/ AFib w/ RVR in setting ARDS w/ severe covid  (followed by pcp)   Primary spontaneous pneumothorax    Past Surgical History:  Procedure Laterality Date   ACHILLES TENDON SURGERY Right 02/24/2011   @[facility]  by dr margrette RAO VEIN TRANSPOSITION  10/13/2019   Procedure: Basilic Vein Transposition Left Arm;  Surgeon: Eliza Lonni RAMAN, MD;  [facility];  Service: Vascular;;   COLONOSCOPY  2009   [facility]   ESOPHAGOGASTRODUODENOSCOPY N/A 12/25/2014   Procedure: ESOPHAGOGASTRODUODENOSCOPY (EGD);  Surgeon: Margo LITTIE Haddock, MD;  Location: [facility];  Service: Endoscopy;  Laterality: N/A;  830am   ESOPHAGOGASTRODUODENOSCOPY N/A 08/09/2019   Procedure: ESOPHAGOGASTRODUODENOSCOPY (EGD);  Surgeon: Donnald Charleston, MD;  Location: [facility];  Service: Endoscopy;  Laterality: N/A;  Patient is already sedated, so I do not anticipate need for further sedation.  Okay from my standpoint to do either at the bedside or in the operating room   GOLD SEED IMPLANT N/A 11/16/2022   Procedure: GOLD SEED IMPLANT;  Surgeon: Renda Glance, MD;  Location: [facility];  Service: Urology;  Laterality: N/A;  30 MINUTES NEEDED FOR CASE   HEMOSTASIS CLIP PLACEMENT  08/09/2019   Procedure: HEMOSTASIS CLIP PLACEMENT;  Surgeon: Donnald Charleston, MD;  Location: [facility];  Service: Endoscopy;;   HEMOSTASIS CONTROL  08/09/2019   Procedure: HEMOSTASIS CONTROL;  Surgeon: Donnald Charleston, MD;  Location: [facility];  Service: Endoscopy;;  epi    IR FLUORO GUIDE CV LINE LEFT  09/02/2019   IR REMOVAL TUN CV CATH W/O FL  10/29/2019   IR US  GUIDE VASC ACCESS LEFT  09/02/2019   KNEE SURGERY Left 1980   per pt repair ligament   LIGATION OF COMPETING BRANCHES OF ARTERIOVENOUS FISTULA Left 04/21/2021   Procedure: LIGATION OF LEFT ARM ARTERIOVENOUS BRACHIOBASILIC FISTULA;  Surgeon: Oris Krystal FALCON, MD;  Location: [facility];  Service: Vascular;  Laterality: Left;   SPACE OAR INSTILLATION N/A 11/16/2022   Procedure: SPACE OAR INSTILLATION;  Surgeon: Renda Glance, MD;  Location: [facility];  Service: Urology;  Laterality: N/A;   TONSILLECTOMY     age 89   WRIST SURGERY Left 1970   per pt repair torn ligament   Patient Active Problem List   Diagnosis Date Noted   Cough 04/13/2023   Malignant neoplasm of prostate (HCC)  09/22/2022   Neck pain 08/28/2022   History of tracheostomy 02/02/2020   CKD (chronic kidney disease), stage IV (HCC) 12/21/2019   Sleep disturbance    Anemia of chronic disease    Cerebral thrombosis with cerebral infarction 08/28/2019   Constipation 08/09/2019   BPH (benign prostatic hyperplasia) 10/31/2013   S/P Achilles tendon repair 04/05/2011    PCP: Alphonsa Glendia LABOR, MD  REFERRING PROVIDER: Margrette Taft BRAVO, MD  REFERRING DIAG: [phone] (ICD-10-CM) - Left foot drop  THERAPY DIAG:  Impaired functional mobility, balance, gait, and endurance  Decreased strength of lower extremity  Decreased range of motion of left ankle  Rationale for Evaluation and Treatment: Rehabilitation  ONSET DATE: 4.5 years  SUBJECTIVE:   SUBJECTIVE STATEMENT: 11/28/24:  Pt stated he is feeling good  today, no reports of pain or recent falls.  Main difficulty with Lt foot drop and overall weakness.  Brought brace for dorsiflexion iwht him today.  Has began HEP without questions.    Eval:  Patient reports he caught COVID 4.5 years ago. Had a stroke while in the hospital. Reports he hasn't been able to do much since then. Tried to go to the gym not long ago but hyperventilated due to being overwhelmed. Has since had drop foot in L foot. Has some motion in DF/PF but side to side is the hardest.  Was very active before he got COVID.   PERTINENT HISTORY: Plan The patient prefers not to use the more significant brace which does control the foot and ankle.  He has a smaller brace which she received recently and it does not work as well   We discussed this   It is probably best to just use the bigger brace but since he does not want to use that 1 it would be wise to try to strengthen the weak muscles in his foot and ankle and see if the smaller brace can control the foot   I do not think surgery is needed because the bigger brace does control the symptoms  PAIN:  Are you having pain? No but has back  pain sometimes  PRECAUTIONS: Fall  RED FLAGS: Reports N/T on L side (UE and LE), constant   WEIGHT BEARING RESTRICTIONS: No  FALLS:  Has patient fallen in last 6 months? No  LIVING ENVIRONMENT: Lives with: lives with their spouse Lives in: House/apartment Stairs: No Has following equipment at home: Single point cane and pre-fabricated AFO for left foot  OCCUPATION: Retired   PLOF: Independent  PATIENT GOALS: To have balance corrected and have more control on drop foot  NEXT MD VISIT: Will have a visit with [employer], hasn't set up visit yet.           No follow up with MD Margrette  OBJECTIVE:  Note: Objective measures were completed at Evaluation unless otherwise noted.  DIAGNOSTIC FINDINGS:  FINDINGS: Five lumbar type vertebral bodies are well visualized. Vertebral body height is well maintained. No pars defects are noted. Disc space narrowing at L4-5 is seen. No soft tissue abnormality is noted.   IMPRESSION: Mild degenerative change without acute abnormality.    PATIENT SURVEYS:  LEFS: Lower Extremity Functional Score: 55 / 80 = 68.8 % ABC Scale: Total ABC score: 1100 / 1600 = 68.8 %   COGNITION: Overall cognitive status: Within functional limits for tasks assessed     SENSATION: Light touch: Impaired  Decreased sensation throughout LLE dermatomes  POSTURE: rounded shoulders and forward head  PALPATION: Not assessed   LOWER EXTREMITY ROM:  Active ROM Right eval Left eval  Hip flexion    Hip extension    Hip abduction    Hip adduction    Hip internal rotation    Hip external rotation    Knee flexion    Knee extension    Ankle dorsiflexion 8 3  Ankle plantarflexion 65 20  Ankle inversion 20 10  Ankle eversion 30 15   (Blank rows = not tested)  LOWER EXTREMITY MMT:  MMT Right eval Left eval  Hip flexion    Hip extension    Hip abduction 5/5 3+  Hip adduction    Hip internal rotation    Hip external rotation    Knee flexion    Knee  extension    Ankle  dorsiflexion    Ankle plantarflexion    Ankle inversion    Ankle eversion     (Blank rows = not tested)   FUNCTIONAL TESTS:  5 times sit to stand: 12 seconds, BUE use Timed up and go (TUG): 11 seconds with SPC 11/28/24: 445 Dec DF with L foot, causing foot drag/toe catching at times, circumduction/hip hiking at times with LLE   GAIT: Distance walked: 150 ft in session  Assistive device utilized: Single point cane Level of assistance: Modified independence Comments: Dec DF with L foot, causing foot drag/toe catching at times, circumduction/hip hiking at times with LLE, SPC in R side, encouraged increased hip/knee flexion to clear foot with improvement                                                                                                                                TREATMENT DATE:  11/28/24: Reviewed goals Educated importance of HEP compliance  416ft with [facility] Dec DF with L foot, causing foot drag/toe catching at times, circumduction/hip hiking at times with LLE  DGI 1. Gait level surface (1) Moderate Impairment: Walks 20', slow speed, abnormal gait pattern, evidence for imbalance. 2. Change in gait speed (2) Mild Impairment: Is able to change speed but demonstrates mild gait deviations, or not gait deviations but unable to achieve a significant change in velocity, or uses an assistive device. 3. Gait with horizontal head turns (2) Mild Impairment: Performs head turns smoothly with slight change in gait velocity, i.e., minor disruption to smooth gait path or uses walking aid. 4. Gait with vertical head turns 1) Moderate Impairment: Performs head turns with moderate change in gait velocity, slows down, staggers but recovers, can continue to walk. 5. Gait and pivot turn (2) Mild Impairment: Pivot turns safely in > 3 seconds and stops with no loss of balance. 6. Step over obstacle (2) Mild Impairment: Is able to step over box, but must slow down  and adjust steps to clear box safely. 7. Step around obstacles (2) Mild Impairment: Is able to step around both cones, but must slow down and adjust steps to clear cones. 8. Stairs (2) Mild Impairment: Alternating feet, must use rail.  TOTAL SCORE: 14 / 24  NMR: Russian estim 10/20 cycle time, intensity range to 37 mA CC x 8 with foot proped on table, try in seated dorsiflexion next session for 8 minutes   11/13/24: PT eval and HEP    PATIENT EDUCATION:  Education details: PT evaluation, objective findings, POC, Importance of HEP, Precautions, Clinic policies Person educated: Patient Education method: Explanation and Demonstration Education comprehension: verbalized understanding and returned demonstration  HOME EXERCISE PROGRAM: Access Code: [email or web address] URL: [email or web address] Date: 11/13/2024 Prepared by: Rosaria Powell-Butler  Exercises - Seated Toe Raise  - 2-3 x daily - 7 x weekly - 3 sets - 10 reps - 5 hold - Ankle Inversion Eversion Towel Slide  - 2-3 x daily -  7 x weekly - 3 sets - 10 reps - Heel Raises with Counter Support  - 2-3 x daily - 7 x weekly - 3 sets - 10 reps  ASSESSMENT:  CLINICAL IMPRESSION: 11/28/24:  Reviewed goals and educated importance of HEP compliance for maximal benefits.  Objective testing including and DGI testing complete.  complete with SPC without dorsiflexion brace, presents with decreased Lt foot dorsiflexion which causes foot drag and toes catching at times, circumducation, and hip hiking, presents with good cadence and no LOB during test.  DGI score 14/24 indicating pt at risk for fall, encouraged to use SPC with gait at this time.  NMR complete with Russian estim to improve anterior tib activation for increased dorsiflexion with good movements noted in gravity eliminated position, plan to begin in seated position for gravity resistance next session.  No reports of pain through session, was limited by appropriate levels of  fatigue.  Eval:  Patient is a 74 y.o. male who was seen today for physical therapy evaluation and treatment for M21.372 (ICD-10-CM) - Left foot drop. On this date, patient demonstrates impaired self perception of function via LEFS and ABC Scale, decreased L ankle ROM, decreased LLE strength, decreased endurance, and impaired balance, all of which may be contributing to patient's altered gait pattern, decreased activity tolerance, difficulty with transfers and impairing their overall function. Patient reports owning multiple prefabricated braces/AFOs, at least 4. Instructed patient to bring brace that best supports him to next session for assessment. Patient also reports already being in contact with [employer] for custom AFO. Highly encouraged reaching back out to them for best possible support while ambulating as he tends to drag foot at times during gait. Patient will benefit from skilled physical therapy to address the above/below deficits in order to improve pain and overall function.    OBJECTIVE IMPAIRMENTS: Abnormal gait, decreased activity tolerance, decreased balance, decreased endurance, decreased mobility, difficulty walking, decreased ROM, decreased strength, impaired perceived functional ability, improper body mechanics, and postural dysfunction.   ACTIVITY LIMITATIONS: squatting, stairs, transfers, and locomotion level  PARTICIPATION LIMITATIONS: cleaning, laundry, and community activity  PERSONAL FACTORS: Time since onset of injury/illness/exacerbation are also affecting patient's functional outcome.   REHAB POTENTIAL: Fair See above  CLINICAL DECISION MAKING: Stable/uncomplicated  EVALUATION COMPLEXITY: Low   GOALS: Goals reviewed with patient? No  SHORT TERM GOALS: Target date: 12/11/24 Patient will be independent with performance of HEP to demonstrate adequate self management of symptoms.  Baseline:  Goal status: INITIAL  2.   Patient will report at least a 50% improvement  with function and/or pain reduction overall since beginning PT. Baseline:  Goal status: INITIAL   LONG TERM GOALS: Target date: 01/08/25 Patient will improve LEFS score by 9 points to demonstrate improved perceived function while meeting MCID.  Baseline: Goal status: INITIAL Patient will improve ABC Scale score by 10 percent to demonstrate improved perceived function while meeting MCID.  Baseline: Goal status: INITIAL 3.  Patient will improve all  L ankle ROM by at least 5 degrees to demonstrate improved LE mobility needed for functional gait mechanics.  Baseline:  Goal status: INITIAL 4.  Patient will score at least a  4-/5 on  L ankle DF MMT to demonstrate increased LE strength and/or power needed to improve ambulation/gait mechanics and limit toe drag during gait.  Baseline:  Goal status: INITIAL   5.  Patient will increase 2 minute walk test gait distance by at least 40 ft with least restrictive assistive  device to demonstrate improved endurance and functional mobility needed for ambulating household and community distances.  Baseline: Goal status: INITIAL    PLAN:  PT FREQUENCY: 1-2x/week  PT DURATION: 8 weeks  PLANNED INTERVENTIONS: 97164- PT Re-evaluation, 97110-Therapeutic exercises, 97530- Therapeutic activity, [id number]- Neuromuscular re-education, 97535- Self Care, 02859- Manual therapy, [phone]- Gait training, [phone]- Electrical stimulation (manual), [id number]- Ultrasound, 02987- Traction (mechanical), [phone] (1-2 muscles), 20561 (3+ muscles)- Dry Needling, Patient/Family education, Balance training, Stair training, Taping, Joint mobilization, Spinal mobilization, Cryotherapy, and Moist heat  PLAN FOR NEXT SESSION:  continue to encourage use of AFO/brace for better L foot control during gait, possible e-stim to L ant tib when appropriate  Augustin Mclean, LPTA/CLT; CBIS [phone]  10:51 AM, 11/28/24  "

## 2024-12-03 ENCOUNTER — Ambulatory Visit (HOSPITAL_COMMUNITY)

## 2024-12-05 ENCOUNTER — Ambulatory Visit (HOSPITAL_COMMUNITY)

## 2024-12-05 ENCOUNTER — Ambulatory Visit

## 2024-12-09 ENCOUNTER — Ambulatory Visit (HOSPITAL_COMMUNITY): Admitting: Physical Therapy

## 2024-12-11 ENCOUNTER — Ambulatory Visit (HOSPITAL_COMMUNITY)

## 2024-12-15 ENCOUNTER — Ambulatory Visit (HOSPITAL_COMMUNITY)

## 2024-12-18 ENCOUNTER — Ambulatory Visit (HOSPITAL_COMMUNITY)

## 2024-12-22 ENCOUNTER — Ambulatory Visit (HOSPITAL_COMMUNITY)

## 2024-12-25 ENCOUNTER — Ambulatory Visit (HOSPITAL_COMMUNITY)

## 2024-12-29 ENCOUNTER — Ambulatory Visit (HOSPITAL_COMMUNITY)

## 2025-01-01 ENCOUNTER — Ambulatory Visit (HOSPITAL_COMMUNITY)

## 2025-01-05 ENCOUNTER — Ambulatory Visit (HOSPITAL_COMMUNITY)

## 2025-01-08 ENCOUNTER — Ambulatory Visit (HOSPITAL_COMMUNITY)

## 2025-01-26 ENCOUNTER — Ambulatory Visit: Admitting: Family Medicine
